# Patient Record
Sex: Male | Born: 1950 | Race: Black or African American | Hispanic: No | Marital: Married | Smoking: Former smoker
Health system: Southern US, Community
[De-identification: ages and names within clinical notes are randomized; demographics above are authoritative.]

## PROBLEM LIST (undated history)

## (undated) ENCOUNTER — Emergency Department (HOSPITAL_BASED_OUTPATIENT_CLINIC_OR_DEPARTMENT_OTHER): Admission: EM | Payer: Medicare HMO

## (undated) DIAGNOSIS — R0602 Shortness of breath: Secondary | ICD-10-CM

## (undated) DIAGNOSIS — I509 Heart failure, unspecified: Secondary | ICD-10-CM

## (undated) DIAGNOSIS — R609 Edema, unspecified: Secondary | ICD-10-CM

## (undated) DIAGNOSIS — J42 Unspecified chronic bronchitis: Secondary | ICD-10-CM

## (undated) DIAGNOSIS — I272 Pulmonary hypertension, unspecified: Secondary | ICD-10-CM

## (undated) DIAGNOSIS — I4891 Unspecified atrial fibrillation: Secondary | ICD-10-CM

## (undated) DIAGNOSIS — J439 Emphysema, unspecified: Secondary | ICD-10-CM

## (undated) DIAGNOSIS — J449 Chronic obstructive pulmonary disease, unspecified: Secondary | ICD-10-CM

## (undated) DIAGNOSIS — I351 Nonrheumatic aortic (valve) insufficiency: Secondary | ICD-10-CM

## (undated) DIAGNOSIS — N183 Chronic kidney disease, stage 3 unspecified: Secondary | ICD-10-CM

## (undated) DIAGNOSIS — R0601 Orthopnea: Secondary | ICD-10-CM

## (undated) DIAGNOSIS — I420 Dilated cardiomyopathy: Secondary | ICD-10-CM

## (undated) DIAGNOSIS — I739 Peripheral vascular disease, unspecified: Secondary | ICD-10-CM

## (undated) DIAGNOSIS — I639 Cerebral infarction, unspecified: Secondary | ICD-10-CM

## (undated) DIAGNOSIS — I10 Essential (primary) hypertension: Secondary | ICD-10-CM

## (undated) DIAGNOSIS — E119 Type 2 diabetes mellitus without complications: Secondary | ICD-10-CM

## (undated) DIAGNOSIS — Z0389 Encounter for observation for other suspected diseases and conditions ruled out: Secondary | ICD-10-CM

## (undated) DIAGNOSIS — I7090 Unspecified atherosclerosis: Secondary | ICD-10-CM

## (undated) HISTORY — DX: Orthopnea: R06.01

## (undated) HISTORY — DX: Chronic obstructive pulmonary disease, unspecified: J44.9

## (undated) HISTORY — PX: OTHER SURGICAL HISTORY: SHX169

## (undated) HISTORY — DX: Encounter for observation for other suspected diseases and conditions ruled out: Z03.89

## (undated) HISTORY — DX: Dilated cardiomyopathy: I42.0

## (undated) HISTORY — DX: Pulmonary hypertension, unspecified: I27.20

## (undated) HISTORY — PX: COLONOSCOPY: SHX174

## (undated) HISTORY — DX: Peripheral vascular disease, unspecified: I73.9

## (undated) HISTORY — DX: Heart failure, unspecified: I50.9

## (undated) HISTORY — DX: Essential (primary) hypertension: I10

## (undated) HISTORY — DX: Edema, unspecified: R60.9

## (undated) HISTORY — DX: Nonrheumatic aortic (valve) insufficiency: I35.1

## (undated) HISTORY — DX: Emphysema, unspecified: J43.9

## (undated) HISTORY — DX: Shortness of breath: R06.02

## (undated) HISTORY — DX: Unspecified chronic bronchitis: J42

## (undated) HISTORY — DX: Unspecified atherosclerosis: I70.90

## (undated) HISTORY — DX: Chronic kidney disease, stage 3 unspecified: N18.30

---

## 2007-10-27 DIAGNOSIS — IMO0001 Reserved for inherently not codable concepts without codable children: Secondary | ICD-10-CM

## 2007-10-27 HISTORY — PX: CARDIAC CATHETERIZATION: SHX172

## 2007-10-27 HISTORY — DX: Reserved for inherently not codable concepts without codable children: IMO0001

## 2008-10-16 ENCOUNTER — Inpatient Hospital Stay (HOSPITAL_COMMUNITY): Admission: EM | Admit: 2008-10-16 | Discharge: 2008-10-19 | Payer: Self-pay | Admitting: Emergency Medicine

## 2008-10-17 ENCOUNTER — Encounter: Payer: Self-pay | Admitting: Cardiology

## 2009-01-14 ENCOUNTER — Ambulatory Visit: Payer: Self-pay | Admitting: *Deleted

## 2009-01-22 ENCOUNTER — Ambulatory Visit: Payer: Self-pay | Admitting: Surgery

## 2009-02-04 ENCOUNTER — Ambulatory Visit (HOSPITAL_COMMUNITY): Admission: RE | Admit: 2009-02-04 | Discharge: 2009-02-04 | Payer: Self-pay | Admitting: Vascular Surgery

## 2009-02-04 ENCOUNTER — Ambulatory Visit: Payer: Self-pay | Admitting: Vascular Surgery

## 2009-02-12 ENCOUNTER — Ambulatory Visit: Payer: Self-pay | Admitting: Vascular Surgery

## 2009-02-20 ENCOUNTER — Inpatient Hospital Stay (HOSPITAL_COMMUNITY): Admission: RE | Admit: 2009-02-20 | Discharge: 2009-02-25 | Payer: Self-pay | Admitting: Vascular Surgery

## 2009-02-21 ENCOUNTER — Encounter: Payer: Self-pay | Admitting: Vascular Surgery

## 2009-02-21 ENCOUNTER — Ambulatory Visit: Payer: Self-pay | Admitting: Vascular Surgery

## 2009-03-05 ENCOUNTER — Ambulatory Visit: Payer: Self-pay | Admitting: Vascular Surgery

## 2009-05-07 ENCOUNTER — Ambulatory Visit: Payer: Self-pay | Admitting: Vascular Surgery

## 2010-06-20 ENCOUNTER — Ambulatory Visit: Payer: Self-pay | Admitting: Vascular Surgery

## 2010-09-03 ENCOUNTER — Ambulatory Visit: Payer: Self-pay | Admitting: Cardiology

## 2010-10-15 ENCOUNTER — Ambulatory Visit: Payer: Self-pay | Admitting: Cardiology

## 2010-12-24 ENCOUNTER — Encounter (INDEPENDENT_AMBULATORY_CARE_PROVIDER_SITE_OTHER): Payer: Self-pay

## 2010-12-24 DIAGNOSIS — Z48812 Encounter for surgical aftercare following surgery on the circulatory system: Secondary | ICD-10-CM

## 2010-12-24 DIAGNOSIS — I739 Peripheral vascular disease, unspecified: Secondary | ICD-10-CM

## 2010-12-30 NOTE — Procedures (Unsigned)
BYPASS GRAFT EVALUATION  INDICATION:  Follow up right bypass graft.  HISTORY: Diabetes:  No. Cardiac:  Yes, congestive heart failure. Hypertension:  Yes. Smoking:  Previous. Previous Surgery:  Right femoral-to-popliteal bypass graft, 02/20/2009 with nonreversed translocated saphenous vein.  SINGLE LEVEL ARTERIAL EXAM                              RIGHT              LEFT Brachial:                    147                137 Anterior tibial:             121                Inaudible Posterior tibial:            124                86 Peroneal: Ankle/brachial index:        0.84               0.59  PREVIOUS ABI:  Date: 06/20/2010  RIGHT:  0.91  LEFT:  0.64  LOWER EXTREMITY BYPASS GRAFT DUPLEX EXAM:  DUPLEX:  Patent right femoral-to-popliteal bypass graft with elevated velocities at 3.59 m/s within the inflow inguinal canal.  Stable compared to previous study of 3.48 m/s.  Waveforms are mildly broadened within the upper thigh.  IMPRESSION:  Stable ankle brachial indices.  Patent right femoral-to- popliteal bypass graft, as described above.    ___________________________________________ [name]. Scot Dock, M.D.  OD/MEDQ  D:  12/25/2010  T:  12/25/2010  Job:  [id number]

## 2011-02-03 LAB — CBC
HCT: 25.9 % — ABNORMAL LOW (ref 39.0–52.0)
MCHC: 34.3 g/dL (ref 30.0–36.0)
MCV: 101.4 fL — ABNORMAL HIGH (ref 78.0–100.0)
Platelets: 232 10*3/uL (ref 150–400)
RDW: 14.4 % (ref 11.5–15.5)

## 2011-02-03 LAB — BASIC METABOLIC PANEL
BUN: 15 mg/dL (ref 6–23)
Chloride: 101 mEq/L (ref 96–112)
GFR calc non Af Amer: >60 mL/min (ref >60)
Glucose, Bld: 113 mg/dL — ABNORMAL HIGH (ref 70–99)
Potassium: 4.3 mEq/L (ref 3.5–5.1)

## 2011-02-04 LAB — BASIC METABOLIC PANEL
BUN: 10 mg/dL (ref 6–23)
BUN: 11 mg/dL (ref 6–23)
Calcium: 8.4 mg/dL (ref 8.4–10.5)
Chloride: 106 mEq/L (ref 96–112)
Chloride: 109 mEq/L (ref 96–112)
Creatinine, Ser: 0.91 mg/dL (ref 0.4–1.5)
Creatinine, Ser: 1.24 mg/dL (ref 0.4–1.5)
GFR calc Af Amer: >60 mL/min (ref >60)
GFR calc non Af Amer: >60 mL/min (ref >60)
Glucose, Bld: 140 mg/dL — ABNORMAL HIGH (ref 70–99)

## 2011-02-04 LAB — COMPREHENSIVE METABOLIC PANEL
ALT: 33 U/L (ref 0–53)
Albumin: 3.8 g/dL (ref 3.5–5.2)
Alkaline Phosphatase: 61 U/L (ref 39–117)
GFR calc Af Amer: >60 mL/min (ref >60)
Potassium: 4.2 mEq/L (ref 3.5–5.1)
Sodium: 142 mEq/L (ref 135–145)
Total Protein: 7 g/dL (ref 6.0–8.3)

## 2011-02-04 LAB — APTT: aPTT: 46 seconds — ABNORMAL HIGH (ref 24–37)

## 2011-02-04 LAB — URINALYSIS, ROUTINE W REFLEX MICROSCOPIC
Glucose, UA: NEGATIVE mg/dL
Hgb urine dipstick: NEGATIVE
Specific Gravity, Urine: 1.029 (ref 1.005–1.030)
Urobilinogen, UA: 1 mg/dL (ref 0.0–1.0)

## 2011-02-04 LAB — POCT I-STAT, CHEM 8
BUN: 18 mg/dL (ref 6–23)
Calcium, Ion: 1.19 mmol/L (ref 1.12–1.32)
Chloride: 106 mEq/L (ref 96–112)
HCT: 45 % (ref 39.0–52.0)
Potassium: 4.4 mEq/L (ref 3.5–5.1)
Sodium: 139 mEq/L (ref 135–145)

## 2011-02-04 LAB — CBC
HCT: 28.3 % — ABNORMAL LOW (ref 39.0–52.0)
MCHC: 34.4 g/dL (ref 30.0–36.0)
MCV: 101 fL — ABNORMAL HIGH (ref 78.0–100.0)
Platelets: 198 10*3/uL (ref 150–400)
Platelets: 207 10*3/uL (ref 150–400)
Platelets: 312 10*3/uL (ref 150–400)
RBC: 2.73 MIL/uL — ABNORMAL LOW (ref 4.22–5.81)
RDW: 14.6 % (ref 11.5–15.5)
RDW: 14.6 % (ref 11.5–15.5)
WBC: 8 10*3/uL (ref 4.0–10.5)
WBC: 9.2 10*3/uL (ref 4.0–10.5)

## 2011-02-04 LAB — TYPE AND SCREEN
ABO/RH(D): O POS
Antibody Screen: NEGATIVE

## 2011-02-04 LAB — HEPARIN LEVEL (UNFRACTIONATED): Heparin Unfractionated: <0.1 IU/mL — ABNORMAL LOW (ref 0.30–0.70)

## 2011-02-04 LAB — ABO/RH: ABO/RH(D): O POS

## 2011-02-21 HISTORY — PX: FEMORAL-POPLITEAL BYPASS GRAFT: SHX937

## 2011-03-10 ENCOUNTER — Other Ambulatory Visit: Payer: Self-pay | Admitting: Cardiology

## 2011-03-10 NOTE — Discharge Summary (Signed)
NAME:  Kyle [name], PHOU NO.:  [id number]   MEDICAL RECORD NO.:  [id number]          PATIENT TYPE:  INP   LOCATION:  2023                         FACILITY:  [facility]   PHYSICIAN:  [name]. Scot Dock, M.D.DATE OF BIRTH:  04/15/1951   DATE OF ADMISSION:  02/20/2009  DATE OF DISCHARGE:  02/24/2009                               DISCHARGE SUMMARY   ADMISSION DIAGNOSIS:  Progressive ischemia of the right lower extremity  with multilevel arterio-occlusive disease.   FINAL DISCHARGE DIAGNOSES:  1. Progressive ischemia of the right lower extremity with multilevel      arterio-occlusive disease status post right femoral-popliteal      bypass grafting.  2. Postoperative hypoxia/oxygen dependent with negative chest x-ray      other than emphysema, improved.  3. History of tobacco abuse but quit in December 2009.  4. Hypertension.  5. Coronary artery disease with myocardial infarction in 2009.  6. Acute congestive hospitalization secondary to systolic dysfunction,      requiring hospitalization on October 19, 2008 felt secondary to      longstanding uncontrolled hypertension with hypertensive heart      disease and left ventricular hypertrophy.  7. Moderate aortic insufficiency with bicuspid aortic valve.  8. Normal coronary anatomy by cardiac catheterization on October 18, 2008.  9. Mild postoperative acute blood loss anemia.   PROCEDURES:  February 20, 2009, vein patch angioplasty of the common  femoral artery with profundoplasty, right femoral to below-knee  popliteal artery bypass with a nonreverse translocated saphenous vein  graft, vein patch angioplasty of vein graft stenosis with intraoperative  arteriogram by Dr. [name].   BRIEF HISTORY:  [name] is a 60 year old black male with a long  history of right calf claudication.  This progressed to the point of  rest pain.  He underwent arteriogram which showed multilevel arterio-  occlusive  disease.  He had diffuse iliofemoral disease which did not  appear to be significant.  There was a tight stenosis of his proximal  deep femoral artery and the superficial femoral artery was occluded at  its origin.  There was reconstitution of the popliteal artery at the  level of the knee with two-vessel runoff near the peroneal and posterior  tibial arteries.  [name] recommended that he undergo right femoral-  popliteal artery bypass grafting as well as profundoplasty.   HOSPITAL COURSE:  [name] was electively admitted to [facility] on February 20, 2009.  He underwent the previously mentioned  procedure.  Postoperatively, he was transferred to Step-Down Unit 3300.  He was on heparin drip for the initial postoperative period but was  subsequently changed to subcutaneous Lovenox which was more for DVT  prophylaxis.  Jackson-Pratt drain was placed intraoperatively but was  discontinued by postoperative day #2, as there was no significant  output.  After postoperative day #1, he was transferred to Telemetry  Unit 2000 where he remained until discharge.  His postoperative ABIs  were 0.57 on the right and 0.67 on the left.  He was seen by Physical  Therapy and Cardiac Rehab and did make progress with mobility, but was  felt that he would benefit from crutches versus a rolling walker.  For  crutches, we have asked case manager to arrange this prior to discharge.  He had a relatively uneventful postoperative course but was requiring  oxygen up to 4 L per nasal cannula.  He had no shortness of breath.  Lung sounds were clear.  However, he did have a fairly significant  amount of nasal congestion and possible viral rhinitis or allergic  rhinitis.  He was on humidified oxygen.  We ultimately did check a chest  x-ray which showed hyperexpansion consistent with emphysema but  otherwise no active disease.  We did prescribe Flonase and per postop  day #4, he was at 95% on 1/2 L of  oxygen per nasal cannula and we do not  anticipate that we will have any trouble weaning him to room air at this  point.  He feels that he has less nasal congestion.  He continues to  have no shortness of breath.  He is maintaining sinus rhythm.  He is  tolerating regular foods and voiding without difficulties.  Extremities  have a positive Doppler signal with dorsalis and posterior tibial pulses  on the right.  Incisions are clean, dry, and intact without erythema.  His labs were stable showing a white count of 8.1, some mild  postoperative acute blood loss anemia with hemoglobin of 8.9, hematocrit  25.9, and platelet count of 237.  Sodium 135, potassium 4.3, BUN 15,  creatinine 1.17, and blood glucose of 113.  His blood pressure has been  stable, most currently 120/71 and heart rate in sinus rhythm.  He has  been afebrile.  Again, we anticipate that he will be weaned completely  from supplemental oxygen on Feb 24, 2009, so we plan that he will be  ready for discharge home at that time.  He is felt to be in stable and  improving condition.   DISCHARGE MEDICATIONS:  1. Amlodipine 5 mg p.o. daily.  2. Lasix 40 mg p.o. daily.  3. Ramipril 5 mg p.o. daily.  4. Klor-Con 20 mEq.  5. Carvedilol 12.5 mg p.o. b.i.d.  6. Baby aspirin 81 mg p.o. daily.  7. Percocet 5/325 mg one each tablet p.o. q.4 h. p.r.n. pain.  8. Flonase 0.05% two sprays in each nostril daily as needed for nasal      congestion and we will instruct not to use for greater than 2      weeks.   DISCHARGE INSTRUCTIONS:  Continue heart-healthy diet.  Increase activity  slowly.  Avoid driving or heavy lifting for the next 2 weeks and see Dr.  Scot Dock in approximately 3 weeks with postoperative ABIs.  He has a  staple removal appointment in 7-10 days and our office will contact him  regarding specific appointment date and time.  He should call sooner if  he has fever greater than 101, redness or drainage from his incision  site  or for increased pain.  He should cover his dressings daily as  needed with gauze dressing for drainage.      [name], P.A.      [name]. Scot Dock, M.D.  Electronically Signed    AWZ/MEDQ  D:  02/24/2009  T:  02/25/2009  Job:  [id number]   cc:   Peter M. Martinique, M.D.  Starla Link, M.D.

## 2011-03-10 NOTE — Procedures (Signed)
BYPASS GRAFT EVALUATION   INDICATION:  Followup right lower extremity bypass graft.   HISTORY:  Diabetes:  No.  Cardiac:  No.  Hypertension:  Yes.  Smoking:  Previous.  Previous Surgery:  Right fem-pop bypass graft on 02/20/2009.   SINGLE LEVEL ARTERIAL EXAM                               RIGHT              LEFT  Brachial:                    127                125  Anterior tibial:             106                50  Posterior tibial:            116                81  Peroneal:  Ankle/brachial index:        0.91               0.64   PREVIOUS ABI:  Date:  05/07/2009  RIGHT:  0.94  LEFT:  0.65   LOWER EXTREMITY BYPASS GRAFT DUPLEX EXAM:   DUPLEX:  Patent right fem-pop bypass graft with elevated velocities of  348 cm/second at right common femoral artery level.  There are biphasic  waveforms noted proximal, within and distal to graft.   IMPRESSION:  1. Stable bilateral ankle brachial indices.  2. Right fem-pop appears patent with elevated velocities as noted      above.   ___________________________________________  [name]. Scot Dock, M.D.   CB/MEDQ  D:  06/20/2010  T:  06/20/2010  Job:  [id number]

## 2011-03-10 NOTE — Discharge Summary (Signed)
NAME:  Kyle [name], Kyle [name] NO.:  [id number]   MEDICAL RECORD NO.:  [id number]          PATIENT TYPE:  INP   LOCATION:  2040                         FACILITY:  [facility]   PHYSICIAN:  Peter M. Martinique, M.D.  DATE OF BIRTH:  08/14/1951   DATE OF ADMISSION:  10/16/2008  DATE OF DISCHARGE:                               DISCHARGE SUMMARY   HISTORY OF PRESENT ILLNESS:  Mr. Kenaston is a 60 year old black male who  was referred for symptoms of worsening shortness of breath, orthopnea,  and cough productive of clear sputum.  He had no fever or chills.  He  has noticed increased lower extremity edema.  He had no prior cardiac  history or known history of hypertension.  On his initial evaluation, he  was found to be severely hypertensive.  The patient is a chronic smoker,  smoking 1 pack per day.   For details of his past medical history, social history, family history,  and physical exam, please see admission history and physical.   LABORATORY DATA:  ECG showed normal sinus rhythm with biatrial  enlargement, LVH, and repolarization abnormality.  Chest x-ray from  [facility] showed cardiomegaly with cephalization of flow and very early  CHF.   ADDITIONAL LABORATORY DATA:  White count 7500, hemoglobin 15.7,  hematocrit 47.5, and platelets 280,000.  BNP level was 1877.  Arterial  blood gas showed a pH of 7.41, pCO2 of 35, pO2 of 82, and bicarbonate of  22 on 2 liters nasal cannula.  Sodium was 144, potassium 4.2, chloride  111, CO2 of 24, BUN 15, creatinine 0.95, glucose of 118, and albumin was  3.0.  Other liver function studies were normal.  CPK-MB was 129 with 5.5  MB and then 101 with 4.7 MB.  Troponin was 0.08 and then 0.10.  Total  cholesterol was 128 with triglyceride of 86, HDL of 32, and LDL of 79.  Urinalysis was normal.  Coags were normal.  Hemoglobin A1c was 6.7%   HOSPITAL COURSE:  The patient was admitted to Telemetry Monitoring.  He  was placed on Lovenox that is  subcu prophylactic dose.  He was started  on antihypertensive therapy with amlodipine and ACE inhibitor.  He was  diuresed with IV Lasix.  During his hospital course, he was given  smoking cessation counseling and did appear to be very motivated.  Subsequent echocardiogram was obtained.  This demonstrated moderate  concentric left ventricular hypertrophy.  He had severe global  hypokinesia with overall ejection fraction of 20-25%.  The aortic valve  was bicuspid.  There was very mild aortic stenosis by Doppler and  moderate aortic insufficiency.  There was mild mitral insufficiency and  moderate tricuspid insufficiency.  Right ventricular systolic pressure  was moderately increased.  There was mild biatrial enlargement.  Followup chest x-ray the day after his admission showed increased  pulmonary edema.  The patient did respond very well to diuresis with  reduction in his edema and weight and good urine output.  His BNP level  declined to 971.  His renal function remained stable with a BUN  of 22  and creatinine 1.1.  Potassium was 3.9.  On October 18, 2008, the  patient underwent cardiac catheterization.  This included right and left  heart catheterization.  He was found to have normal coronary anatomy.  Left ventricular function again was noted to be severely reduced with  ejection fraction of 20-25% with global LV dysfunction.  The aortic root  was normal with calcification of the aortic valve and moderate aortic  insufficiency.  Right atrial pressure was 11/6 with a mean of 6 mmHg.  Pulmonary pressure was 45/23 consistent with moderate pulmonary  hypertension.  Pulmonary capillary wedge pressure was 12/11 with a mean  of 10 mmHg.  There was no significant aortic or mitral valve gradient.  The patient was transitioned over to oral diuretics.  He was noted to  have 2 episodes of nonsustained ventricular tachycardia 126 beats on  October 17, 2008, and 106 beats on October 18, 2008.  He  was  asymptomatic with these episodes.  The patient responded well to therapy  with excellent blood pressure control and given his good excellent  clinical response, he was felt to be stable for discharge on October 19, 2008.   DISCHARGE DIAGNOSES:  1. Acute congestive heart failure secondary to systolic dysfunction.      Etiology is felt to be longstanding uncontrolled hypertension with      hypertensive heart disease and left ventricular hypertrophy.  2. Moderate aortic insufficiency with bicuspid aortic valve.  3. Normal coronary anatomy.  4. Tobacco abuse.  5. Severe hypertension.  6. Nonsustained ventricular tachycardia.   DISCHARGE MEDICATIONS:  1. Coated aspirin 81 mg per day.  2. Lasix 40 mg orally daily.  3. Potassium 20 mEq daily.  4. Carvedilol 12.5 mg twice a day.  5. Altace 2.5 mg daily.  6. Amlodipine 5 mg per day.   The patient is instructed on a low-sodium, heart-healthy diet.  He is  given smoking cessation counseling.  He is given routine counseling on  followup with congestive heart failure.  He will have a followup with  Dr. Martinique in 2 weeks post discharge.   DISCHARGE STATUS:  Improved.           ______________________________  Peter M. Martinique, M.D.     PMJ/MEDQ  D:  10/18/2008  T:  10/18/2008  Job:  [id number]   cc:   Caprice Red Dr. Tammi Klippel

## 2011-03-10 NOTE — Op Note (Signed)
NAME:  Kyle [name], Kyle [name]                ACCOUNT NO.:  [id number]   MEDICAL RECORD NO.:  [id number]          PATIENT TYPE:  AMB   LOCATION:  SDS                          FACILITY:  [facility]   PHYSICIAN:  [name]. Scot Dock, M.D.DATE OF BIRTH:  1951-09-29   DATE OF PROCEDURE:  02/04/2009  DATE OF DISCHARGE:                               OPERATIVE REPORT   PREOPERATIVE DIAGNOSIS:  Right lower extremity claudication and rest  pain in the right foot with infrainguinal arterial occlusive disease.   POSTOPERATIVE DIAGNOSIS:  Right lower extremity claudication and rest  pain in the right foot with infrainguinal arterial occlusive disease.   PROCEDURE:  1. Ultrasound-guided access to the left common femoral artery.  2. Aortogram with bilateral iliac arteriogram and bilateral lower      extremity runoff.  3. Selective catheterization of the right external iliac artery.   SURGEON:  [name]. Scot Dock, M.D.   ANESTHESIA:  Local with sedation.   TECHNIQUE:  The patient was taken to the PV lab and sedated with 1 mg of  Versed and 50 mcg of fentanyl.  Both groins were prepped and draped in  usual sterile fashion.  After the skin was infiltrated with 1% lidocaine  and under ultrasound guidance, the left common femoral artery was  cannulated and a guidewire introduced into the infrarenal aorta under  fluoroscopic control.  A 5-French sheath was introduced over the wire.  Pigtail catheter was positioned at the L1 vertebral body and flush  aortogram obtained.  The catheter was then positioned above the aortic  bifurcation and oblique iliac projections were obtained.  Next the  pigtail catheter was exchanged for an IMA catheter which was positioned  into the right common iliac artery.  The wire was then advanced down  into the external iliac artery and the IMA catheter exchanged for an end-  hole catheter which was positioned in the distal right external iliac  artery.  Right external iliac  arteriogram was obtained with right lower  extremity runoff.  The end-hole catheter was then removed and then left  lower extremity films obtained through the left femoral sheath.   FINDINGS:  There are single renal arteries bilaterally with no  significant renal artery stenosis identified.  The infrarenal aorta is  widely patent.   On the right side there is some mild diffuse disease of the common and  external iliac artery but no focal stenosis.  Hypogastric artery on the  right is occluded.  The common femoral and deep femoral artery are  patent although there is a tight 90-95% proximal right deep femoral  artery stenosis which is fairly focal.  The superficial femoral artery  is occluded at its origin and there is reconstitution of the popliteal  artery at the level of the knee with two-vessel runoff via the posterior  tibial and peroneal arteries.  The anterior tibial artery on the right  is occluded.   On the left side the common iliac, external iliac and hypogastric  arteries are patent.  The common femoral and deep femoral artery are  patent.  On the left  side the superficial femoral artery is occluded at  its origin with reconstitution of the above-knee popliteal artery.  The  popliteal artery is patent and there is two-vessel runoff on the right  via the posterior tibial and peroneal arteries.  The anterior tibial  artery on the left is occluded.   CONCLUSIONS:  Bilateral superficial femoral artery occlusions with two-  vessel runoff bilaterally as described above.      [name]. Scot Dock, M.D.  Electronically Signed     CSD/MEDQ  D:  02/04/2009  T:  02/04/2009  Job:  [id number]   cc:   Peter M. Martinique, M.D.  Fax: [phone]   Dr. Starla Link

## 2011-03-10 NOTE — Cardiovascular Report (Signed)
NAME:  Kyle [name], Kyle [name] NO.:  [id number]   MEDICAL RECORD NO.:  [id number]          PATIENT TYPE:  INP   LOCATION:  2040                         FACILITY:  [facility]   PHYSICIAN:  Peter M. Martinique, M.D.  DATE OF BIRTH:  12-26-1950   DATE OF PROCEDURE:  10/18/2008  DATE OF DISCHARGE:                            CARDIAC CATHETERIZATION   INDICATIONS FOR PROCEDURE:  A 60 year old black male who presented with  new-onset congestive heart failure.  Echocardiogram shows severe left  ventricular dysfunction.  The patient has a bicuspid aortic valve with  moderate aortic insufficiency.  The patient also had a mild cardiac  enzyme leak on admission.   PROCEDURES:  1. Right and left heart catheterization.  2. Coronary angiography.  3. Left ventricular angiography.   EQUIPMENT USED:  6-French 4-cm right and left Judkins catheter, 6-French  pigtail catheter, 6-French arterial sheath, 7-French venous sheath, and  7-French balloon-tipped Swan-Ganz catheter.   MEDICATIONS:  Local anesthesia, 1% Xylocaine.   CONTRAST:  125 mL of Omnipaque.   HEMODYNAMIC DATA:  Right atrial pressure was 11/6 with a mean 6 mmHg.  Right ventricular pressure is 43 with an EDP of 12 mmHg.  Pulmonary  artery pressure is 45/23 with a mean of 31 mmHg.  Pulmonary capillary  wedge pressure is 12/11 with a mean of 10 mmHg.  Aortic pressure is  119/65 with a mean of 87 mmHg.  Left ventricular pressure is 118 with an  EDP of 15 mmHg.  There is no significant aortic valve gradient by  pullback.  There is no significant mitral valve gradient.  Cardiac  output by Fick is 3.37 L/min with an index of 1.83 by thermodilution  cardiac outputs 2.83 with an index of 1.54.   ANGIOGRAPHIC DATA:  Aortic root angiography was performed in the RAO  view.  This demonstrates a calcified aortic valve.  The aortic root  appears normal.  There is moderate aortic insufficiency graded 3+.   Left ventricular angiography was also  performed in the RAO view.  This  demonstrates severe global hypokinesia with overall ejection fraction  estimated at 20-25%.  There is trivial mitral insufficiency noted.  No  intracavitary filling defects are noted.   CORONARY ANGIOGRAPHY:  The left main coronary is normal.  It is  relatively short.   Left anterior descending artery is a large vessel, which wraps around  the apex.  It gives rise to 2 small diagonal branches.  It is normal  throughout its course.   The left circumflex coronary is a large vessel.  The first and second  marginal vessels are large in caliber.  The third and fourth obtuse  marginal vessels were relatively small.  The entire circumflex  distribution is normal.   The right coronary artery is a dominant vessel and is normal throughout  its course.   FINAL INTERPRETATION:  1. Severe global left ventricular dysfunction with an ejection      fraction of 20-25%.  2. Moderate aortic insufficiency.  3. Moderate pulmonary hypertension.  4. Normal coronary anatomy.   PLAN:  We would recommend  continued aggressive medical therapy.           ______________________________  Peter M. Martinique, M.D.     PMJ/MEDQ  D:  10/18/2008  T:  10/18/2008  Job:  [id number]   cc:   Vernell Leep, MD

## 2011-03-10 NOTE — Op Note (Signed)
NAME:  Kyle [name], Kyle [name] NO.:  [id number]   MEDICAL RECORD NO.:  [id number]          PATIENT TYPE:  INP   LOCATION:  3313                         FACILITY:  [facility]   PHYSICIAN:  [name]. Scot Dock, M.D.DATE OF BIRTH:  1951/06/01   DATE OF PROCEDURE:  02/20/2009  DATE OF DISCHARGE:                               OPERATIVE REPORT   PREOPERATIVE DIAGNOSIS:  Progressive ischemia of the right lower  extremity with multilevel arterial occlusive disease.   POSTOPERATIVE DIAGNOSIS:  Progressive ischemia of the right lower  extremity with multilevel arterial occlusive disease.   PROCEDURE:  1. Vein patch angioplasty of the common femoral artery with      profundoplasty.  2. Right femoral to below-knee popliteal artery bypass graft with a      nonreversed translocated saphenous vein graft.  3. Vein patch angioplasty of vein graft stenosis.  4. Intraoperative arteriogram.   SURGEON:  [name]. Scot Dock, MD   ASSISTANT:  [name], PA   ANESTHESIA:  General.   INDICATIONS:  This is a 60 year old gentleman with a long history of  right calf claudication.  This had progressed to the point of rest pain  in his right foot.  His symptoms became progressive and he underwent  arteriogram which showed multilevel arterial occlusive disease.  He had  some diffuse iliofemoral disease which did not appear to be significant.  He had a tight stenosis of his proximal deep femoral artery and the  superficial femoral artery was occluded at its origin.  There is  reconstitution of the popliteal artery at the level of the knee with 2-  vessel runoff via the peroneal and posterior tibial arteries.  He is  brought in for elective revascularization.   TECHNIQUE:  The patient was taken to the operating room and the entire  right lower extremity and right groin were prepped and draped in the  usual sterile fashion.  A longitudinal incision was made in the right  groin and  through this incision the common femoral, superficial femoral,  and deep femoral arteries were dissected free.  There was diffuse  disease throughout the common femoral artery posteriorly and also the  proximal deep femoral artery.  I dissected on the deep femoral artery  beyond the area of stenosis.  I had to clamp up fairly high under the  inguinal ligament where the vessel was softer.  There was stenosis in  the mid segment of the common femoral artery which was seen on  arteriogram which was clearly identified.  Through this same incision,  the saphenofemoral junction was dissected free, and using 3 additional  incisions along the medial aspect of the right leg, the saphenous vein  was harvested to the mid calf.  Branches were divided between clips and  3-0 silk ties.  Through the distal incision which was below the knee,  the below-knee popliteal artery was exposed and this did appear to be  patent at this level.  A tunnel was then created between the below-knee  incision to the groin incision and the patient was then heparinized.  The  saphenofemoral junction was clamped and then the saphenous vein was  excised from the femoral vein.  The femoral vein was oversewn with a 5-0  Prolene suture and there was good hemostasis.  The proximal valve in the  vein was sharply excised.  Next, the deep femoral artery was clamped  distally.  The superficial femoral artery was occluded, the external  iliac artery was clamped, and 2 additional branches were controlled with  vessel loops.  A longitudinal arteriotomy was made in the anterolateral  wall of the common femoral artery and this was distended through the  proximal stenosis in the deep femoral artery down into the deep femoral  artery beyond the stenosis.  The arteriotomy was then extended  proximally above the area of stenosis in the common femoral artery where  the vessel was patent.  There was marked diffuse disease of the common  femoral  artery and I felt the risk of endarterectomizing this area would  be difficult at an adequate endpoint into the deep femoral artery.  I  elected, therefore, to simply patch this area with a vein patch.  I did  not think he would have adequate length to use the segment which had  been harvested previously, so I harvested vein from the distal leg on  the right through a longitudinal incision and again branches were  divided between clips and 3-0 silk ties.  The segment of vein was  excised and then opened longitudinally and was then sewn as a vein patch  using two 5-0 Prolene sutures.  I was able to easily get a 3.5-mm  dilator down into the deep femoral artery after the profundoplasty.  Prior to completing this anastomosis, the artery was backbled and  flushed.  There was good inflow.  The vessels were then controlled again  and a longitudinal venotomy was made over the vein patch.  The saphenous  vein was sewn in a nonreversed fashion end to side to the vein patch  using continuous 6-0 Prolene suture.  At the completion, the clamps were  released and there was an excellent pulse in the proximal vein.  I then  used a retrograde Mills valvulotome to lyse all the valves in the vein.  The vein was reasonable size, although not especially large.  There was  1 area over approximately 3 cm which narrowed down to 3.5 mm and this  was clearly the smallest area in the vein and I was concerned about this  area.  For this reason, I elected to do a vein patch angioplasty of this  area as I thought it was a little small in this one area.  The remaining  segment of vein had been shaved and it was sewn as a vein patch using  continuous 6-0 Prolene suture.  At the completion, the clamps were  released and there was excellent flow through the graft.  The vein was  marked to prevent twisting and then brought down to the previously  created tunnel for anastomosis to the below-knee popliteal artery.  A   tourniquet was placed on the thigh.  The leg was exsanguinated with an  Esmarch bandage and the tourniquet was inflated to 250 mmHg.  Under  tourniquet control, a longitudinal arteriotomy was made in the below-  knee popliteal artery and vein graft was cut to the appropriate length,  spatulated, and sewn end to side to the below-knee popliteal artery  using 2 continuous 6-0 Prolene sutures parachuting both the heel and  toe  of the anastomosis.  At the completion, there was good posterior tibial  signal at the Doppler which was graft dependent.  A completion  arteriogram was obtained which showed no technical problems.  Hemostasis  was obtained in the wounds.  A #15 Blake drain was placed in groin  incision and also in the below-the-knee incision.  The distal leg  incision was closed with deep layer of 3-0 Vicryl and the skin was  closed with staples.  The groin incision was closed with a deep layer of  2-0 Vicryl, subcutaneous layer of 2-0 Vicryl, and the skin was closed  with 4-0 subcuticular stitch.  The remaining incisions were closed with  a deep layer of 3-0 Vicryl and the skin was closed with staples.  A  sterile dressing was applied.  The patient tolerated the procedure well  and was transferred to recovery room in satisfactory condition.  All  needle and sponge counts were correct.      [name]. Scot Dock, M.D.  Electronically Signed     CSD/MEDQ  D:  02/20/2009  T:  02/21/2009  Job:  [id number]   cc:   Peter M. Martinique, M.D.  Dr. Starla Link

## 2011-03-10 NOTE — Telephone Encounter (Signed)
[name] called She said that her hausband gets his meds thru [facility] and they didn't get all paperwork completed. So he couldn't get his meds. He is totally out of all his meds. She wanted to know if Dr Martinique could write new RX's so they could get at [facility] Please call

## 2011-03-10 NOTE — Procedures (Signed)
BYPASS GRAFT EVALUATION   INDICATION:  Right lower extremity bypass graft.   HISTORY:  Diabetes:  No.  Cardiac:  No.  Hypertension:  Yes.  Smoking:  Previous.  Previous Surgery:  Right fem-pop bypass graft on 02/20/2009.   SINGLE LEVEL ARTERIAL EXAM                               RIGHT              LEFT  Brachial:                    142                140  Anterior tibial:             124                Not detected  Posterior tibial:            133                92  Peroneal:                                       82  Ankle/brachial index:        0.94               0.65   PREVIOUS ABI:  Date:  01/14/2009  RIGHT:  0.38  LEFT:  0.62   LOWER EXTREMITY BYPASS GRAFT DUPLEX EXAM:   DUPLEX:  Biphasic Doppler waveforms noted throughout the right lower  extremity bypass graft and its native vessels.  There is mildly elevated  velocity of 256 cm/second noted in the right common femoral artery.  There is an elevated velocity of 182 cm/second noted at the distal  anastomosis level, however, this is most likely due to a change in  vessel diameter.   IMPRESSION:  1. Patent right fem-pop bypass graft with increased velocities noted      as described above.  2. Stable left ABI with an improvement of the right ABI noted.   ___________________________________________  [name]. Scot Dock, M.D.   CH/MEDQ  D:  05/08/2009  T:  05/08/2009  Job:  [id number]

## 2011-03-10 NOTE — Procedures (Signed)
CAROTID DUPLEX EXAM   INDICATION:  Left carotid bruit.   HISTORY:  Diabetes:  No.  Cardiac:  No.  Hypertension:  Yes.  Smoking:  Quit.  Previous Surgery:  No.  CV History:  No.  Amaurosis Fugax No, Paresthesias No, Hemiparesis No                                       RIGHT             LEFT  Brachial systolic pressure:  Brachial Doppler waveforms:  Vertebral direction of flow:        Antegrade         Antegrade  DUPLEX VELOCITIES (cm/sec)  CCA peak systolic                   89                [id number]  ECA peak systolic                   100               96  ICA peak systolic                   66                96  ICA end diastolic                   19                20  PLAQUE MORPHOLOGY:                  Heterogenous      Heterogenous  PLAQUE AMOUNT:                      Mild              Mild  PLAQUE LOCATION:                    ICA/ECA           ICA/ECA   IMPRESSION:  1. 1-39% stenosis noted in bilateral ICAs.  2. Antegrade bilateral vertebral arteries.       ___________________________________________  [name]. Scot Dock, M.D.   MG/MEDQ  D:  01/22/2009  T:  01/23/2009  Job:  [id number]

## 2011-03-10 NOTE — H&P (Signed)
NAME:  Kyle [name], Kyle [name] NO.:  [id number]   MEDICAL RECORD NO.:  [id number]          PATIENT TYPE:  EMS   LOCATION:  MAJO                         FACILITY:  [facility]   PHYSICIAN:  Peter M. Martinique, M.D.  DATE OF BIRTH:  [birth year]   DATE OF ADMISSION:  10/16/2008  DATE OF DISCHARGE:                              HISTORY & PHYSICAL   HISTORY OF PRESENT ILLNESS:  Kyle [name] is a 60 year old black male  referred by Dr. Tammi Klippel at [facility] for acute respiratory distress.  This patient's primary physician is Dr. Starla Link at [facility].  In general, he has been in good health.  He does have a long history of  tobacco abuse.  He presents with a 1-week history of worsening shortness  of breath, orthopnea, and cough productive of mild clear sputum.  He has  had no fever, chills.  He denies any chest pain.  He has noted  increasing lower extremity edema.  He has no prior history of congestive  heart failure or other cardiac disease, and no prior history of  hypertension.   PAST MEDICAL HISTORY:  The patient reports no prior hospitalizations or  surgeries.  He denies any history of hypertension, hypercholesterolemia,  or diabetes.  He has had no other medical illnesses.  He is on no  medications.  He has no known allergies.   SOCIAL HISTORY:  He is married.  He has 1 child.  He works in [employer].  He smokes 1 pack per day.  He has been a smoker for 40 years.  He  does drink occasional beer.   FAMILY HISTORY:  His father's history is unknown.  His mother died of  diabetic coma at age 69.  He has 1 sister who is alive and well.   REVIEW OF SYSTEMS:  He has had a colonoscopy this past year that was  negative.  He denies any history of TIA or stroke.  He denies prior  history of hypertension.  He denies any hemoptysis.  He has had no  recent change in bowel or bladder habits.  Perhaps, his urine output has  decreased slightly.  He denies any claudication symptoms or  cold feet.  All other systems were reviewed and are negative.   PHYSICAL EXAMINATION:  GENERAL:  The patient is a thin black male in no  apparent distress.  VITAL SIGNS:  Blood pressure is 187/110, pulse is 99 and regular.  He is  afebrile, respirations 22, sats were 94% on 2 L nasal cannula.  HEENT:  Normocephalic, atraumatic.  His pupils equal, round, and  reactive to light and accommodation.  Extraocular movements were full.  Oropharynx is clear.  Tongue is midline.  NECK:  Supple with mild jugular venous distention.  He has no bruits,  adenopathy, or thyromegaly.  LUNGS:  Bibasilar crackles.  CARDIAC:  Prominent PMI displaced laterally.  He has a loud S4.  There  is no S3 or murmur.  ABDOMEN:  Soft, nontender without masses, hepatosplenomegaly, or bruits.  Bowel sounds were positive.  EXTREMITIES:  Femoral pulses 2+ and symmetric.  Pedal pulses were very  poor.  He has 1-2+ lower extremity edema.  His feet are warm and dry.  SKIN:  Dry.  NEUROLOGIC:  He is alert and oriented x4.  His cranial nerves II through  XII are intact.  He has no focal findings.   LABORATORY DATA:  ECG shows normal sinus rhythm with right atrial  enlargement, LVH, and repolarization changes.  Chest x-ray from  [place] shows cardiomegaly, there is cephalization of flow and mild  changes in COPD.  There were no pleural effusions or acute infiltrates.   IMPRESSION:  1. Acute respiratory distress most likely consistent with congestive      heart failure due to hypertensive heart disease.  2. Severe hypertension, uncontrolled.  3. Chronic obstructive pulmonary disease.  4. Tobacco abuse.   PLAN:  We will admit to telemetry.  We will check labs including CBC,  chemistries, BNP level, cardiac enzymes, lipid panel, A1c.  We will also  check a urinalysis.  We will treat with diuresis.  We will also start on  Norvasc and ACE inhibitor for blood pressure control.  We will hold on  beta-blocker at this time.   Given his heavy tobacco use and possible  COPD, the patient will be counseled on smoking cessation.  We will also  obtain an echocardiogram to evaluate his LV function.           ______________________________  Peter M. Martinique, M.D.     PMJ/MEDQ  D:  10/16/2008  T:  10/17/2008  Job:  [id number]   cc:   Rulon Eisenmenger, M.D.

## 2011-03-10 NOTE — Procedures (Signed)
VASCULAR LAB EXAM   INDICATION:  Evaluation of right greater saphenous vein prior to right  lower extremity revascularization.   HISTORY:  Diabetes:  No.  Cardiac:  No.  Hypertension:  Yes.   EXAM:  Mapping of right greater saphenous vein.   IMPRESSION:  Right greater saphenous vein was identified.  Diameter  measurements ranged from 0.32 cm to 0.49 cm from the saphenofemoral  junction to the proximal calf.  Measurements are on the affixed page.   ___________________________________________  [name]. Scot Dock, M.D.   MC/MEDQ  D:  02/12/2009  T:  02/12/2009  Job:  [id number]

## 2011-03-10 NOTE — Consult Note (Signed)
VASCULAR SURGERY CONSULTATION   Kyle [name], Kyle [name] [name]  DOB:  [birth year]                                       01/22/2009  CHART#:[id number]   I saw the patient in the office today in consultation concerning his  right foot pain.  This is a pleasant 60 year old gentleman who has had  some right calf claudication for approximately a month.  The calf  claudication occurs at less than 20 yards and is relieved with rest.  He  has had no thigh or hip claudication.  He has had no symptoms in the  left leg.  Approximately 3 weeks ago he began noticing the gradual onset  of increasing pain in the right foot and developed rest pain in the  right foot.  He has had no history of nonhealing ulcers.  His symptoms  have been relatively stable over the last 2 weeks.   PAST MEDICAL HISTORY:  Significant for hypertension and history of  congestive heart failure.  In addition, he has a history of COPD.  He  denies any history of diabetes, hypercholesterolemia.  He does state  that he had a myocardial infarction in December of 2009.   FAMILY HISTORY:  There is no history of premature cardiovascular  disease.   SOCIAL HISTORY:  He is married.  He has one child.  He had smoked one  pack per day of cigarettes for 40 years but states that he quit smoking  in December 2009.  He has three beers a day.   REVIEW OF SYSTEMS:  GENERAL:  He has had no weight loss, weight gains or  problems with his appetite.  CARDIAC:  He has had no chest pain, chest pressure, palpitations or  arrhythmias.  PULMONARY:  He has had no productive cough, bronchitis, asthma or  wheezing.  GI, GU, neuro, ortho, psychiatric, ENT and hematologic review of systems  are unremarkable and are documented on the medical history form in his  chart.   MEDICATIONS:  1. Aspirin 81 mg p.o. daily.  2. Lasix 40 mg p.o. daily.  3. Potassium 20 mEq p.o. daily.  4. Carvedilol 12.5 mg p.o. b.i.d.  5. Altace 2.5 mg daily.  6.  Amlodipine 5 mg p.o. daily.   PHYSICAL EXAMINATION:  This is a pleasant 60 year old gentleman who  appears his stated age.  Blood pressure is 132/77, heart rate is 57.  Neck is supple.  There is no cervical lymphadenopathy.  He does have a  soft left carotid bruit.  Lungs are clear bilaterally to auscultation.  On cardiac exam he has a regular rate and rhythm.  Abdomen is soft and  nontender.  I cannot palpate an aneurysm.  He has normal femoral pulses.  I cannot palpate popliteal or pedal pulses on either side.  He has a  monophasic peroneal and posterior tibial signal on the right with a  monophasic posterior tibial and anterior tibial signal on the left.  He  has no ischemic ulcers.  The right foot is slightly cooler than the  left.  He has no mottling.   He did have Doppler studies on 01/16/2009 which showed an ABI of 38% on  the right and 62% on the left.   Based on his exam I think he has evidence of multilevel arterial  occlusive disease on the right and likely has a superficial  femoral  artery occlusion in addition to tibial occlusive disease.  Given that he  is having significant rest pain on the right I have recommended we  proceed with arteriography to see what options he might have for  revascularization.  I have explained there is a small chance we will  find some infrainguinal disease on the right which is amenable to  angioplasty but more likely he would require bypass for  revascularization.  We have discussed the indications for arteriography  and the potential complications including but not limited to bleeding,  arterial injury, renal insufficiency.  All of his questions were  answered and he is agreeable to proceed.  We will make further  recommendations pending the results of his arteriogram.  I did explain  that if he has a superficial femoral artery stenosis amenable to  angioplasty this could potentially be addressed at the same time.  We  have reviewed the  potential complications of angioplasty also.  When he  comes in for his followup visit after his arteriogram we will obtain a  carotid duplex scan to work up his left carotid bruit.  If he is going  to require bypass for revascularization on the right he will also get a  vein map of the right greater saphenous vein.   Kyle [name] [name], M.D.  Electronically Signed  CSD/MEDQ  D:  01/22/2009  [name]:  01/23/2009  Job:  [id number]   cc:   Kyle [name] [name], M.D.  Kyle [name] [name]

## 2011-03-10 NOTE — Assessment & Plan Note (Signed)
OFFICE VISIT   DREXEL, VELARDO  DOB:  18-Apr-1951                                       03/05/2009  CHART#:[id number]   I saw the patient for followup after his recent left fem-pop bypass.  He  had progressive ischemia of his right lower extremity and underwent vein  patch angioplasty of the common femoral artery with profundoplasty,  right femoral to below knee popliteal artery bypass graft with a vein  graft and vein patch angioplasty of a vein graft stenosis.  He did well  postoperatively and returns for his first followup visit.   PHYSICAL EXAMINATION:  On examination blood pressure is 148/70, heart  rate is 78.  His incisions are all healing nicely.  He has a palpable  femoral and popliteal pulse.  He has known tibial occlusive disease, 58%  up from 38% preoperatively.  He has known tibial occlusive disease on  the right.   Overall I am pleased with his progress.  I have encouraged him to try to  stay off cigarettes as best he can and to get on a structured walking  program.  I will see him back in 2 months.  He knows to call sooner if  he has problems.   [name]. Scot Dock, M.D.  Electronically Signed   CSD/MEDQ  D:  03/05/2009  T:  03/06/2009  Job:  [id number]

## 2011-03-10 NOTE — Assessment & Plan Note (Signed)
OFFICE VISIT   TEVON, STAVIS  DOB:  May 19, 1951                                       05/07/2009  CHART#:[id number]   I saw the patient in the office today for followup after his recent  right lower extremity bypass.  He had presented with progressive  ischemia of his right lower extremity and on 02/20/2009 underwent vein  patch angioplasty of the right common femoral artery with profundoplasty  and right femoral to below knee pop bypass with a vein graft with vein  patch angioplasty of a vein graft stenosis.  He returns for a 3 month  followup visit.   Since I saw him last he has had no claudication, rest pain or nonhealing  ulcers.  He has been doing well without any specific complaints.   PHYSICAL EXAMINATION:  Blood pressure is 165/74, heart rate is 54.  Lungs are clear bilaterally to auscultation.  On cardiac exam he has a  regular rate and rhythm.  He has a palpable femoral, popliteal and  dorsalis pedis pulse on the right.  He has a palpable femoral pulse on  the left with a warm, well-perfused left foot.   Doppler study in our office today shows a patent bypass graft with some  increased velocities in the proximal graft at 256 cm/sec and increased  velocities distally likely related to change in caliper of the artery.  Velocity distally is 182 cm/sec.  ABI on the right is 94% up from 38%.  ABI on the left is stable at 65%.   Overall I am pleased with his progress and will follow his graft on our  protocol.  I plan on seeing him back in 1 year unless there are any  specific concerns prior to this.   [name]. Scot Dock, M.D.  Electronically Signed   CSD/MEDQ  D:  05/07/2009  T:  05/08/2009  Job:  [id number]

## 2011-03-10 NOTE — H&P (Signed)
HISTORY AND PHYSICAL EXAMINATION   February 12, 2009   Re:  KAREL, STEGE T                DOB:  02/20/1951   REASON FOR ADMISSION:  Rest pain in the right foot with multilevel  arterial occlusive disease.   HISTORY:  This is a pleasant 60 year old gentleman who I had seen in  consultation on 01/22/2009 with rest pain in the right foot.  He had had  some right calf claudication for approximately a month.  This occurs at  less than 20 yards and is relieved with rest.  He had had no significant  thigh or hip claudication.  He had had no significant symptoms on the  left side.  In early March he began experiencing pain in the right foot  at rest which was relieved somewhat with dependency.  He had had no  history of nonhealing ulcers.  His symptoms have remained relatively  stable.   PAST MEDICAL HISTORY:  Significant for hypertension and a history of  congestive heart failure.  Additionally he has a history of COPD.  He  denies any history of diabetes, hypercholesterolemia.  He did have a  myocardial infarction in December of 2009.   FAMILY HISTORY:  There is no history of premature cardiovascular  disease.   SOCIAL HISTORY:  He is married and he has one child.  He had smoked one  pack per day of cigarettes for 40 years but quit smoking in December of  2009.  He has three beers a day.   REVIEW OF SYSTEMS:  GENERAL:  He has had no recent weight loss, weight  gain or problems with his appetite.  CARDIAC:  He has had no chest pain, chest pressure, palpitations or  arrhythmias.  PULMONARY:  He has had no productive cough, bronchitis, asthma or  wheezing.  GI, GU, neuro, ortho, psychiatric, ENT and hematologic review of systems  are unremarkable.   ALLERGIES:  No known drug allergies.   MEDICATIONS:  1. Aspirin 81 mg p.o. daily.  2. Lasix 40 mg p.o. daily.  3. Potassium 20 mEq p.o. daily.  4. Carvedilol 12.5 mg p.o. b.i.d.  5. Altace 5 mg p.o. daily.  6.  Amlodipine 5 mg p.o. daily.   PHYSICAL EXAMINATION:  This is a pleasant 60 year old gentleman who  appears his stated age.  His blood pressure is 161/72, heart rate is 16.  Neck is supple.  There is no cervical lymphadenopathy.  He has a soft  left carotid bruit.  Lungs are clear bilaterally to auscultation.  On  cardiac exam he has a regular rate and rhythm.  Abdomen is soft and  nontender.  I cannot palpate an aneurysm.  He has normal pitched bowel  sounds.  He has palpable femoral pulses.  I cannot palpate a popliteal  pedal pulse on either side.  He has a monophasic peroneal and posterior  tibial signal on the right with the Doppler and a monophasic posterior  tibial and anterior tibial signal on the left.  He has no ischemic  ulcers.  The right foot is slightly cooler than the left but there are  no ischemic ulcers and no discoloration of the skin.   Doppler study on 01/16/2009 showed an ABI of 38% on the right and 62% on  the left.  Based on his exam he had multilevel arterial occlusive  disease and underwent an arteriogram.  This showed some mild diffuse  iliac disease on  the right with an occluded hypogastric artery.  Below  that he had a superficial femoral artery occlusion at its origin with  reconstitution of the below knee popliteal artery.  Of note, he had a  significant proximal stenosis of the deep femoral artery.  On the right  he has two vessel runoff via the posterior tibial artery and peroneal  artery.   Given his progressive ischemic symptoms in the right leg now with rest  pain I have recommended right lower extremity revascularization.  Given  the long segment SFA occlusion I think he would best be served with a  right femoral to below knee popliteal artery bypass graft with a vein  graft.  His vein was mapped in the office today and looks reasonable.  In addition, I think he will require a profundoplasty to address the  deep femoral artery stenosis in the right  groin.  We have discussed the  indications for surgery and the potential complications including but  not limited to bleeding, wound healing problems, graft thrombosis and  limb loss.  All of his questions were answered and he is agreeable to  proceed.  His surgery has been scheduled for 02/20/2009.   [name]. Scot Dock, M.D.  Electronically Signed   CSD/MEDQ  D:  02/12/2009  T:  02/13/2009  Job:  [id number]   cc:   Peter M. Martinique, M.D.  Dr Starla Link

## 2011-03-11 MED ORDER — QUINAPRIL HCL 10 MG PO TABS: 10.0000 mg | ORAL_TABLET | Freq: Every day | ORAL | Status: DC

## 2011-03-11 MED ORDER — AMLODIPINE BESYLATE 5 MG PO TABS: 5.0000 mg | ORAL_TABLET | Freq: Every day | ORAL | Status: DC

## 2011-03-11 MED ORDER — FUROSEMIDE 40 MG PO TABS: 40.0000 mg | ORAL_TABLET | Freq: Every day | ORAL | Status: DC

## 2011-03-11 MED ORDER — CARVEDILOL 12.5 MG PO TABS: 12.5000 mg | ORAL_TABLET | Freq: Two times a day (BID) | ORAL | Status: DC

## 2011-03-11 NOTE — Telephone Encounter (Signed)
Wife called stating he is out of his medication and the public health dept says that they won't be available for about 6 weeks. Wants Rx sent to [facility] on [address]. meds sent.

## 2011-06-22 ENCOUNTER — Encounter: Payer: Self-pay | Admitting: Cardiology

## 2011-06-30 ENCOUNTER — Other Ambulatory Visit: Payer: Self-pay | Admitting: *Deleted

## 2011-06-30 ENCOUNTER — Ambulatory Visit: Payer: Self-pay | Admitting: Cardiology

## 2011-07-24 ENCOUNTER — Encounter: Payer: Self-pay | Admitting: Cardiology

## 2011-07-31 LAB — BASIC METABOLIC PANEL
BUN: 14 mg/dL (ref 6–23)
BUN: 22 mg/dL (ref 6–23)
BUN: 24 mg/dL — ABNORMAL HIGH (ref 6–23)
CO2: 28 mEq/L (ref 19–32)
CO2: 29 mEq/L (ref 19–32)
Calcium: 8.5 mg/dL (ref 8.4–10.5)
Calcium: 8.6 mg/dL (ref 8.4–10.5)
Calcium: 8.6 mg/dL (ref 8.4–10.5)
Chloride: 100 mEq/L (ref 96–112)
Chloride: 101 mEq/L (ref 96–112)
Chloride: 108 mEq/L (ref 96–112)
Creatinine, Ser: 1.1 mg/dL (ref 0.4–1.5)
Creatinine, Ser: 1.1 mg/dL (ref 0.4–1.5)
Creatinine, Ser: 1.17 mg/dL (ref 0.4–1.5)
GFR calc Af Amer: >60 mL/min (ref >60)
GFR calc Af Amer: >60 mL/min (ref >60)
GFR calc Af Amer: >60 mL/min (ref >60)
GFR calc non Af Amer: >60 mL/min (ref >60)
GFR calc non Af Amer: >60 mL/min (ref >60)
GFR calc non Af Amer: >60 mL/min (ref >60)
Glucose, Bld: 103 mg/dL — ABNORMAL HIGH (ref 70–99)
Glucose, Bld: 110 mg/dL — ABNORMAL HIGH (ref 70–99)
Potassium: 3.9 mEq/L (ref 3.5–5.1)
Potassium: 4.1 mEq/L (ref 3.5–5.1)
Sodium: 136 mEq/L (ref 135–145)
Sodium: 141 mEq/L (ref 135–145)

## 2011-07-31 LAB — COMPREHENSIVE METABOLIC PANEL
AST: 32 U/L (ref 0–37)
Albumin: 3 g/dL — ABNORMAL LOW (ref 3.5–5.2)
Alkaline Phosphatase: 88 U/L (ref 39–117)
Chloride: 111 mEq/L (ref 96–112)
GFR calc Af Amer: >60 mL/min (ref >60)
Potassium: 4.2 mEq/L (ref 3.5–5.1)
Sodium: 144 mEq/L (ref 135–145)
Total Bilirubin: 1.2 mg/dL (ref 0.3–1.2)
Total Protein: 5.6 g/dL — ABNORMAL LOW (ref 6.0–8.3)

## 2011-07-31 LAB — POCT I-STAT 3, ART BLOOD GAS (G3+)
Acid-Base Excess: 3 mmol/L — ABNORMAL HIGH (ref 0.0–2.0)
Acid-base deficit: 2 mmol/L (ref 0.0–2.0)
Bicarbonate: 22.3 meq/L (ref 20.0–24.0)
Bicarbonate: 27.7 mEq/L — ABNORMAL HIGH (ref 20.0–24.0)
O2 Saturation: 96 %
TCO2: 23 mmol/L (ref 0–100)
pCO2 arterial: 34.9 mmHg — ABNORMAL LOW (ref 35.0–45.0)
pH, Arterial: 7.413 (ref 7.350–7.450)
pH, Arterial: 7.413 (ref 7.350–7.450)
pO2, Arterial: 60 mmHg — ABNORMAL LOW (ref 80.0–100.0)
pO2, Arterial: 82 mmHg (ref 80.0–100.0)

## 2011-07-31 LAB — LIPID PANEL
Triglycerides: 86 mg/dL (ref <150)
VLDL: 17 mg/dL (ref 0–40)

## 2011-07-31 LAB — URINALYSIS, ROUTINE W REFLEX MICROSCOPIC
Glucose, UA: NEGATIVE mg/dL
Ketones, ur: NEGATIVE mg/dL
Nitrite: NEGATIVE
Specific Gravity, Urine: 1.015 (ref 1.005–1.030)
pH: 5.5 (ref 5.0–8.0)

## 2011-07-31 LAB — DIFFERENTIAL
Basophils Absolute: 0.1 10*3/uL (ref 0.0–0.1)
Basophils Relative: 1 % (ref 0–1)
Eosinophils Relative: 1 % (ref 0–5)
Monocytes Absolute: 0.8 10*3/uL (ref 0.1–1.0)
Monocytes Relative: 11 % (ref 3–12)

## 2011-07-31 LAB — B-NATRIURETIC PEPTIDE (CONVERTED LAB)
Pro B Natriuretic peptide (BNP): 1877 pg/mL — ABNORMAL HIGH (ref 0.0–100.0)
Pro B Natriuretic peptide (BNP): 2059 pg/mL — ABNORMAL HIGH (ref 0.0–100.0)
Pro B Natriuretic peptide (BNP): 483 pg/mL — ABNORMAL HIGH (ref 0.0–100.0)
Pro B Natriuretic peptide (BNP): 971 pg/mL — ABNORMAL HIGH (ref 0.0–100.0)

## 2011-07-31 LAB — PROTIME-INR
INR: 1.2 (ref 0.00–1.49)
Prothrombin Time: 15.5 seconds — ABNORMAL HIGH (ref 11.6–15.2)

## 2011-07-31 LAB — HEMOGLOBIN A1C
Hgb A1c MFr Bld: 6.7 % — ABNORMAL HIGH (ref 4.6–6.1)
Mean Plasma Glucose: 146 mg/dL

## 2011-07-31 LAB — CARDIAC PANEL(CRET KIN+CKTOT+MB+TROPI)
CK, MB: 4.7 ng/mL — ABNORMAL HIGH (ref 0.3–4.0)
Relative Index: 4.7 — ABNORMAL HIGH (ref 0.0–2.5)
Total CK: 101 U/L (ref 7–232)
Troponin I: 0.1 ng/mL — ABNORMAL HIGH (ref 0.00–0.06)

## 2011-07-31 LAB — TROPONIN I: Troponin I: 0.08 ng/mL — ABNORMAL HIGH (ref 0.00–0.06)

## 2011-07-31 LAB — POCT I-STAT 3, VENOUS BLOOD GAS (G3P V)
Acid-base deficit: 1 mmol/L (ref 0.0–2.0)
Bicarbonate: 26.5 mEq/L — ABNORMAL HIGH (ref 20.0–24.0)
pH, Ven: 7.31 — ABNORMAL HIGH (ref 7.250–7.300)

## 2011-07-31 LAB — CBC
HCT: 47.5 % (ref 39.0–52.0)
Hemoglobin: 15.7 g/dL (ref 13.0–17.0)
MCHC: 33 g/dL (ref 30.0–36.0)
MCV: 107.7 fL — ABNORMAL HIGH (ref 78.0–100.0)
Platelets: 280 10*3/uL (ref 150–400)
RBC: 4.41 MIL/uL (ref 4.22–5.81)
RDW: 16.9 % — ABNORMAL HIGH (ref 11.5–15.5)
WBC: 7.5 10*3/uL (ref 4.0–10.5)

## 2011-07-31 LAB — CK TOTAL AND CKMB (NOT AT ARMC): Relative Index: 4.3 — ABNORMAL HIGH (ref 0.0–2.5)

## 2011-07-31 LAB — MAGNESIUM: Magnesium: 2.2 mg/dL (ref 1.5–2.5)

## 2011-07-31 LAB — APTT: aPTT: 34 seconds (ref 24–37)

## 2011-09-05 ENCOUNTER — Other Ambulatory Visit: Payer: Self-pay | Admitting: Cardiology

## 2011-09-07 ENCOUNTER — Telehealth: Payer: Self-pay | Admitting: Cardiology

## 2011-09-07 NOTE — Telephone Encounter (Signed)
Furosimide 40mg , accupril 10mg , carvedilol 12mg , amlodipine 5mg  uses [facility]

## 2011-10-02 ENCOUNTER — Ambulatory Visit (INDEPENDENT_AMBULATORY_CARE_PROVIDER_SITE_OTHER): Payer: Self-pay | Admitting: Cardiology

## 2011-10-02 ENCOUNTER — Encounter: Payer: Self-pay | Admitting: Cardiology

## 2011-10-02 VITALS — BP 132/66 | HR 64 | Ht 71.0 in | Wt 165.0 lb

## 2011-10-02 DIAGNOSIS — I509 Heart failure, unspecified: Secondary | ICD-10-CM

## 2011-10-02 DIAGNOSIS — I428 Other cardiomyopathies: Secondary | ICD-10-CM

## 2011-10-02 DIAGNOSIS — I359 Nonrheumatic aortic valve disorder, unspecified: Secondary | ICD-10-CM

## 2011-10-02 DIAGNOSIS — E1159 Type 2 diabetes mellitus with other circulatory complications: Secondary | ICD-10-CM | POA: Insufficient documentation

## 2011-10-02 DIAGNOSIS — E7800 Pure hypercholesterolemia, unspecified: Secondary | ICD-10-CM

## 2011-10-02 DIAGNOSIS — I5022 Chronic systolic (congestive) heart failure: Secondary | ICD-10-CM | POA: Insufficient documentation

## 2011-10-02 DIAGNOSIS — I10 Essential (primary) hypertension: Secondary | ICD-10-CM

## 2011-10-02 DIAGNOSIS — I739 Peripheral vascular disease, unspecified: Secondary | ICD-10-CM

## 2011-10-02 DIAGNOSIS — I420 Dilated cardiomyopathy: Secondary | ICD-10-CM

## 2011-10-02 DIAGNOSIS — I351 Nonrheumatic aortic (valve) insufficiency: Secondary | ICD-10-CM | POA: Insufficient documentation

## 2011-10-02 MED ORDER — QUINAPRIL HCL 10 MG PO TABS: 10.0000 mg | ORAL_TABLET | Freq: Every day | ORAL | Status: DC

## 2011-10-02 MED ORDER — PRAVASTATIN SODIUM 80 MG PO TABS: 80.0000 mg | ORAL_TABLET | Freq: Every day | ORAL | Status: DC

## 2011-10-02 MED ORDER — AMLODIPINE BESYLATE 5 MG PO TABS: 5.0000 mg | ORAL_TABLET | Freq: Every day | ORAL | Status: DC

## 2011-10-02 MED ORDER — CARVEDILOL 12.5 MG PO TABS: 12.5000 mg | ORAL_TABLET | Freq: Two times a day (BID) | ORAL | Status: DC

## 2011-10-02 NOTE — Assessment & Plan Note (Addendum)
When initially diagnosed he had an ejection fraction of 20-25%. His most recent echocardiogram in June of 2011 showed normal LV function with an ejection fraction of 55-60%. He is asymptomatic. I recommended stopping his Lasix at this point. He will remain on his ACE inhibitor and beta blocker therapy. We will schedule him for fasting lab work including chemistries and lipid panel.

## 2011-10-02 NOTE — Assessment & Plan Note (Signed)
" >>  ASSESSMENT AND PLAN FOR PERIPHERAL ARTERIAL DISEASE WRITTEN ON 10/02/2011  5:41 PM BY Ashland Osmer, MAUDE HERO, MD  Status post right femoropopliteal bypass. He is asymptomatic. Continue risk factor modification. He is no longer smoking. "

## 2011-10-02 NOTE — Assessment & Plan Note (Signed)
Blood pressure is well controlled. I renewed his antihypertensive therapy today.

## 2011-10-02 NOTE — Patient Instructions (Addendum)
Stop taking Lasix. You may use it as needed for swelling.  We will schedule you for fasting lab work  Continue your other medications.  I will see you again in 1 year.  Appointment for fasting lab work on Monday December 17. Labs open at 8:30. Nothing to eat or drink after midnight. May have black coffee and take medication with sip of water.

## 2011-10-02 NOTE — Assessment & Plan Note (Signed)
He has a history of a bicuspid aortic valve with moderate insufficiency. I would anticipate a followup echocardiogram next year.

## 2011-10-02 NOTE — Progress Notes (Signed)
   Kyle [name] Date of Birth: 09/26/1951 Medical Record [id number]  History of Present Illness: Kyle [name] is seen today for yearly followup. He states he is doing very well. He has been employed by [employer] and is working hard. He denies any shortness of breath, orthopnea, PND, or edema. He's had no chest pain or palpitations. He denies any claudication symptoms. He has lost 22 pounds over the past year. He feels very well.  Current Outpatient Prescriptions on File Prior to Visit  Medication Sig Dispense Refill  . aspirin 81 MG chewable tablet Chew 81 mg by mouth daily.        . furosemide (LASIX) 40 MG tablet TAKE ONE TABLET BY MOUTH EVERY DAY  30 tablet  1    No Known Allergies  Past Medical History  Diagnosis Date  . Aortic insufficiency     MODERATE WITH A BICUSPID AORTIC VAVLE  . Hypertension   . Peripheral arterial disease   . Dilated cardiomyopathy     WITH EJECTION FRACTION DOWN TO 20-25%--WITH CONGESTIVE HEART FAILURE  . CHF (congestive heart failure)   . SOB (shortness of breath)   . Orthopnea   . Edema     LOWER EXTREMETIES  . COPD (chronic obstructive pulmonary disease)   . Arterial occlusive disease     MULTILEVEL    Past Surgical History  Procedure Date  . Other surgical history     POST RIGHT FEMOROPOPLITEAL BYPASS GRAFT  . Colonoscopy   . Cardiac catheterization     EJECTION FRACTION 20-25%  . Femoral-popliteal bypass graft     right    History  Smoking status  . Former Smoker -- 1.0 packs/day  Smokeless tobacco  . Not on file  Comment: quit 2010    History  Alcohol Use  . Yes    Family History  Problem Relation Age of Onset  . Diabetes Mother     Review of Systems: As noted in history of present illness.  All other systems were reviewed and are negative.  Physical Exam: BP 132/66  Pulse 64  Ht 5\' 11"  (1.803 m)  Wt 165 lb (74.844 kg)  BMI 23.01 kg/m2 He is a pleasant white male in no acute distress. He is normocephalic,  atraumatic. Pupils are equal round and reactive to light accommodation. Extraocular movements are full. Oropharynx is clear. Neck is supple without JVD, adenopathy, thyromegaly, or bruits. Lungs are clear. Cardiac exam reveals a soft grade 1/6 systolic ejection murmur left sternal border. There is a soft grade 2/6 diastolic murmur at the upper left sternal border. There is no gallop. Abdomen is soft and nontender. His feet are warm and dry. He has good pedal pulses. LABORATORY DATA:  ECG today shows normal sinus rhythm with a normal ECG. Assessment / Plan:

## 2011-10-02 NOTE — Assessment & Plan Note (Signed)
Status post right femoropopliteal bypass. He is asymptomatic. Continue risk factor modification. He is no longer smoking.

## 2011-10-09 ENCOUNTER — Telehealth: Payer: Self-pay | Admitting: *Deleted

## 2011-10-09 ENCOUNTER — Other Ambulatory Visit (INDEPENDENT_AMBULATORY_CARE_PROVIDER_SITE_OTHER): Payer: Self-pay | Admitting: *Deleted

## 2011-10-09 DIAGNOSIS — E7800 Pure hypercholesterolemia, unspecified: Secondary | ICD-10-CM

## 2011-10-09 LAB — LIPID PANEL
Cholesterol: 132 mg/dL (ref 0–200)
LDL Cholesterol: 81 mg/dL (ref 0–99)
Total CHOL/HDL Ratio: 4
Triglycerides: 69 mg/dL (ref 0.0–149.0)
VLDL: 13.8 mg/dL (ref 0.0–40.0)

## 2011-10-09 NOTE — Telephone Encounter (Signed)
Message copied by Hetty Blend on Fri Oct 09, 2011  4:09 PM ------      Message from: Martinique, PETER M      Created: Fri Oct 09, 2011  3:25 PM       Lipids look very good. Continue Rx.      Collier Salina Martinique

## 2011-10-09 NOTE — Telephone Encounter (Signed)
Notified wife of lab results.

## 2011-10-12 ENCOUNTER — Other Ambulatory Visit: Payer: Self-pay | Admitting: *Deleted

## 2011-12-30 ENCOUNTER — Ambulatory Visit: Payer: Self-pay | Admitting: Vascular Surgery

## 2012-02-02 ENCOUNTER — Encounter: Payer: Self-pay | Admitting: Neurosurgery

## 2012-02-03 ENCOUNTER — Ambulatory Visit (INDEPENDENT_AMBULATORY_CARE_PROVIDER_SITE_OTHER): Payer: Self-pay | Admitting: Neurosurgery

## 2012-02-03 ENCOUNTER — Encounter: Payer: Self-pay | Admitting: Neurosurgery

## 2012-02-03 ENCOUNTER — Encounter (INDEPENDENT_AMBULATORY_CARE_PROVIDER_SITE_OTHER): Payer: Self-pay | Admitting: *Deleted

## 2012-02-03 ENCOUNTER — Ambulatory Visit (INDEPENDENT_AMBULATORY_CARE_PROVIDER_SITE_OTHER): Payer: Self-pay | Admitting: *Deleted

## 2012-02-03 VITALS — BP 186/85 | HR 73 | Resp 16 | Ht 71.0 in | Wt 169.9 lb

## 2012-02-03 DIAGNOSIS — I739 Peripheral vascular disease, unspecified: Secondary | ICD-10-CM

## 2012-02-03 DIAGNOSIS — Z48812 Encounter for surgical aftercare following surgery on the circulatory system: Secondary | ICD-10-CM

## 2012-02-03 NOTE — Progress Notes (Signed)
[facility] [place] HISTORY AND PHYSICAL   CC: Annual lower extremity bypass graft evaluation Referring Physician: Scot Dock  History of Present Illness: 61 year old male patient of [name] here for annual lower arterial bypass graft evaluation, the patient reports to be asymptomatic, no lower extremity pain with walking or at rest, no open wounds in the lower extremities. Patient has no new medical problems and no new surgical history.  Past Medical History  Diagnosis Date  . Aortic insufficiency     MODERATE WITH A BICUSPID AORTIC VAVLE  . Hypertension   . Peripheral arterial disease   . Dilated cardiomyopathy     WITH EJECTION FRACTION DOWN TO 20-25%--WITH CONGESTIVE HEART FAILURE  . CHF (congestive heart failure)   . SOB (shortness of breath)   . Orthopnea   . Edema     LOWER EXTREMETIES  . COPD (chronic obstructive pulmonary disease)   . Arterial occlusive disease     MULTILEVEL    ROS: [x]  Positive   [ ]  Denies    General: [ ]  Weight loss, [ ]  Fever, [ ]  chills Neurologic: [ ]  Dizziness, [ ]  Blackouts, [ ]  Seizure [ ]  Stroke, [ ]  "Mini stroke", [ ]  Slurred speech, [ ]  Temporary blindness; [ ]  weakness in arms or legs, [ ]  Hoarseness Cardiac: [ ]  Chest pain/pressure, [ ]  Shortness of breath at rest [ ]  Shortness of breath with exertion, [ ]  Atrial fibrillation or irregular heartbeat Vascular: [ ]  Pain in legs with walking, [ ]  Pain in legs at rest, [ ]  Pain in legs at night,  [ ]  Non-healing ulcer, [ ]  Blood clot in vein/DVT,   Pulmonary: [ ]  Home oxygen, [ ]  Productive cough, [ ]  Coughing up blood, [ ]  Asthma,  [ ]  Wheezing Musculoskeletal:  [ ]  Arthritis, [ ]  Low back pain, [ ]  Joint pain Hematologic: [ ]  Easy Bruising, [ ]  Anemia; [ ]  Hepatitis Gastrointestinal: [ ]  Blood in stool, [ ]  Gastroesophageal Reflux/heartburn, [ ]  Trouble swallowing Urinary: [ ]  chronic Kidney disease, [ ]  on HD - [ ]  MWF or [ ]  TTHS, [ ]  Burning with urination, [ ]   Difficulty urinating Skin: [ ]  Rashes, [ ]  Wounds Psychological: [ ]  Anxiety, [ ]  Depression   Social History History  Substance Use Topics  . Smoking status: Former Smoker -- 1.0 packs/day  . Smokeless tobacco: Not on file   Comment: quit 2010  . Alcohol Use: Yes    Family History Family History  Problem Relation Age of Onset  . Diabetes Mother   . Hyperlipidemia Sister   . Hypertension Sister     No Known Allergies  Current Outpatient Prescriptions  Medication Sig Dispense Refill  . amLODipine (NORVASC) 5 MG tablet Take 1 tablet (5 mg total) by mouth daily.  30 tablet  11  . aspirin 81 MG chewable tablet Chew 81 mg by mouth daily.        . carvedilol (COREG) 12.5 MG tablet Take 1 tablet (12.5 mg total) by mouth 2 (two) times daily with a meal.  60 tablet  11  . pravastatin (PRAVACHOL) 80 MG tablet Take 1 tablet (80 mg total) by mouth daily.  30 tablet  11  . quinapril (ACCUPRIL) 10 MG tablet Take 1 tablet (10 mg total) by mouth daily.  30 tablet  11  . furosemide (LASIX) 40 MG tablet TAKE ONE TABLET BY MOUTH EVERY DAY  30 tablet  1    Physical Examination  Filed Vitals:   02/03/12 1541  BP: 186/85  Pulse: 73  Resp: 16    Body mass index is 23.70 kg/(m^2).  General:  WDWN in NAD Gait: Normal HEENT: WNL Eyes: Pupils equal Pulmonary: normal non-labored breathing , without Rales, rhonchi,  wheezing Cardiac: RRR, without  Murmurs, rubs or gallops; No carotid bruits Abdomen: soft, NT, no masses Skin: no rashes, ulcers noted Vascular Exam/Pulses: PT and DP pulses bilaterally are palpable but diminished, he has 2+ bilateral radial pulses, I do feel a diminished popliteal pulse on the right nothing on the left  Extremities without ischemic changes, no Gangrene , no cellulitis; no open wounds;  Musculoskeletal: no muscle wasting or atrophy  Neurologic: A&O X 3; Appropriate Affect ; SENSATION: normal; MOTOR FUNCTION:  moving all extremities equally. Speech is  fluent/normal  Non-Invasive Vascular Imaging:: Fluoroscopy arterial ABIs showed today he is 0.82 on the right 0.53 on the left which is not a significant change from February 2012 when he was 0.84 on the right and 0.59 on the left area and this was reviewed with [name] and he is in concurrence with me with concern over 80 increased velocity of 353 just proximal to the graft in the right leg.  ASSESSMENT/PLAN: Assessment as above/per [name] we'll bring the patient back in 3 months for repeat lower extremity studies he'll make a determination at that time if intervention is necessary, I did instruct the patient if he has any difficulties prior to that time to please let us know and we get them in sooner, the patient and his wife's questions were encouraged and answered  [facility] ANP  Clinic M.D.: Scot Dock

## 2012-02-04 NOTE — Progress Notes (Signed)
Addended by: Mena Goes on: 02/04/2012 03:20 PM   Modules accepted: Orders

## 2012-02-10 NOTE — Procedures (Unsigned)
BYPASS GRAFT EVALUATION  INDICATION:  Followup right fem-pop graft.  HISTORY: Diabetes:  No Cardiac:  No Hypertension:  Yes Smoking:  Previous Previous Surgery:  Right fem-pop graft placed 02/20/2009  SINGLE LEVEL ARTERIAL EXAM                              RIGHT              LEFT Brachial: Anterior tibial: Posterior tibial: Peroneal: Ankle/brachial index:        0.82               0.53  PREVIOUS ABI:  Date: 12/24/2010  RIGHT:  0.84  LEFT:  0.59  LOWER EXTREMITY BYPASS GRAFT DUPLEX EXAM:  DUPLEX: 1. Widely patent right fem-pop graft with significant stenosis of the     native inflow artery.  Waveforms throughout the graft are     monophasic. 2. Please see diagram for velocities.  IMPRESSION: 1. Widely patent right fem-pop graft without evidence of restenosis or     hyperplasia. 2. Significant stenosis is observed in the native inflow artery.  ___________________________________________ [name]. Scot Dock, M.D.  LT/MEDQ  D:  02/04/2012  T:  02/04/2012  Job:  [id number]

## 2012-03-23 ENCOUNTER — Telehealth: Payer: Self-pay

## 2012-03-23 MED ORDER — PRAVASTATIN SODIUM 40 MG PO TABS: ORAL_TABLET | ORAL | Status: DC

## 2012-03-23 NOTE — Telephone Encounter (Signed)
Patient's wife called was told received fax from [facility] wanting to clarify pravachol mg and directions.Wife stated patient takes pravachol 40 mg 2 tablets every night.Prescription sent to [facility].

## 2012-05-10 ENCOUNTER — Encounter: Payer: Self-pay | Admitting: Neurosurgery

## 2012-05-11 ENCOUNTER — Encounter (INDEPENDENT_AMBULATORY_CARE_PROVIDER_SITE_OTHER): Payer: Self-pay | Admitting: *Deleted

## 2012-05-11 ENCOUNTER — Encounter: Payer: Self-pay | Admitting: Neurosurgery

## 2012-05-11 ENCOUNTER — Ambulatory Visit (INDEPENDENT_AMBULATORY_CARE_PROVIDER_SITE_OTHER): Payer: Self-pay | Admitting: Neurosurgery

## 2012-05-11 VITALS — BP 190/96 | HR 73 | Resp 18 | Ht 71.0 in | Wt 160.0 lb

## 2012-05-11 DIAGNOSIS — I70219 Atherosclerosis of native arteries of extremities with intermittent claudication, unspecified extremity: Secondary | ICD-10-CM | POA: Insufficient documentation

## 2012-05-11 DIAGNOSIS — I739 Peripheral vascular disease, unspecified: Secondary | ICD-10-CM

## 2012-05-11 DIAGNOSIS — Z48812 Encounter for surgical aftercare following surgery on the circulatory system: Secondary | ICD-10-CM

## 2012-05-11 NOTE — Progress Notes (Signed)
[facility] [place] PAD/PVD Office Note  CC: Three-month followup for right lower extremity bypass graft and ABIs Referring Physician: Scot Dock  History of Present Illness: 61 year old male patient of [name] who I saw in April for followup status post a right femoropopliteal graft in April 2010. Patient denies claudication, rest pain and has no open ulcerations. The patient states he has no disruption in his ADLs. The patient denies any new medical diagnoses or recent surgeries.  Past Medical History  Diagnosis Date  . Aortic insufficiency     MODERATE WITH A BICUSPID AORTIC VAVLE  . Hypertension   . Dilated cardiomyopathy     WITH EJECTION FRACTION DOWN TO 20-25%--WITH CONGESTIVE HEART FAILURE  . CHF (congestive heart failure)   . SOB (shortness of breath)   . Orthopnea   . Edema     LOWER EXTREMETIES  . COPD (chronic obstructive pulmonary disease)   . Arterial occlusive disease     MULTILEVEL  . Peripheral arterial disease     ROS: [x]  Positive   [ ]  Denies    General: [ ]  Weight loss, [ ]  Fever, [ ]  chills Neurologic: [ ]  Dizziness, [ ]  Blackouts, [ ]  Seizure [ ]  Stroke, [ ]  "Mini stroke", [ ]  Slurred speech, [ ]  Temporary blindness; [ ]  weakness in arms or legs, [ ]  Hoarseness Cardiac: [ ]  Chest pain/pressure, [ ]  Shortness of breath at rest [ ]  Shortness of breath with exertion, [ ]  Atrial fibrillation or irregular heartbeat Vascular: [ ]  Pain in legs with walking, [ ]  Pain in legs at rest, [ ]  Pain in legs at night,  [ ]  Non-healing ulcer, [ ]  Blood clot in vein/DVT,   Pulmonary: [ ]  Home oxygen, [ ]  Productive cough, [ ]  Coughing up blood, [ ]  Asthma,  [ ]  Wheezing Musculoskeletal:  [ ]  Arthritis, [ ]  Low back pain, [ ]  Joint pain Hematologic: [ ]  Easy Bruising, [ ]  Anemia; [ ]  Hepatitis Gastrointestinal: [ ]  Blood in stool, [ ]  Gastroesophageal Reflux/heartburn, [ ]  Trouble swallowing Urinary: [ ]  chronic Kidney disease, [ ]  on HD - [ ]  MWF or  [ ]  TTHS, [ ]  Burning with urination, [ ]  Difficulty urinating Skin: [ ]  Rashes, [ ]  Wounds Psychological: [ ]  Anxiety, [ ]  Depression   Social History History  Substance Use Topics  . Smoking status: Former Smoker -- 1.0 packs/day  . Smokeless tobacco: Not on file   Comment: quit 2010  . Alcohol Use: Yes    Family History Family History  Problem Relation Age of Onset  . Diabetes Mother   . Hyperlipidemia Sister   . Hypertension Sister     No Known Allergies  Current Outpatient Prescriptions  Medication Sig Dispense Refill  . amLODipine (NORVASC) 5 MG tablet Take 1 tablet (5 mg total) by mouth daily.  30 tablet  11  . aspirin 81 MG chewable tablet Chew 81 mg by mouth daily.        . carvedilol (COREG) 12.5 MG tablet Take 1 tablet (12.5 mg total) by mouth 2 (two) times daily with a meal.  60 tablet  11  . pravastatin (PRAVACHOL) 40 MG tablet Take 2 tablets every night  60 tablet  11  . quinapril (ACCUPRIL) 10 MG tablet Take 1 tablet (10 mg total) by mouth daily.  30 tablet  11  . furosemide (LASIX) 40 MG tablet TAKE ONE TABLET BY MOUTH EVERY DAY  30 tablet  1  Physical Examination  Filed Vitals:   05/11/12 1510  BP: 190/96  Pulse: 73  Resp: 18    Body mass index is 22.32 kg/(m^2).  General:  WDWN in NAD Gait: Normal HEENT: WNL Eyes: Pupils equal Pulmonary: normal non-labored breathing , without Rales, rhonchi,  wheezing Cardiac: RRR, without  Murmurs, rubs or gallops; No carotid bruits Abdomen: soft, NT, no masses Skin: no rashes, ulcers noted Vascular Exam/Pulses: 3+ femoral pulses bilaterally with palpable PT and DP pulses bilaterally  Extremities without ischemic changes, no Gangrene , no cellulitis; no open wounds;  Musculoskeletal: no muscle wasting or atrophy  Neurologic: A&O X 3; Appropriate Affect ; SENSATION: normal; MOTOR FUNCTION:  moving all extremities equally. Speech is fluent/normal  Non-Invasive Vascular Imaging: ABIs today are 0.87 and  triphasic to biphasic on the right, 0.59 and monophasic on the left, duplex graft scan shows widely patent graft with a proximal narrowing and velocities no greater than 270.  ASSESSMENT/PLAN: Stable, asymptomatic patient 3 year status post right femoropopliteal graft. The patient will return in 6 months for repeat ABIs and duplex bypass graft scan, [name] reviewed these findings. The patient's questions were encouraged and answered, he is in agreement with this plan.  Beatris Ship ANP  Clinic M.D.: Scot Dock

## 2012-05-11 NOTE — Addendum Note (Signed)
Addended by: Mena Goes on: 05/11/2012 03:58 PM   Modules accepted: Orders

## 2012-05-19 NOTE — Procedures (Unsigned)
BYPASS GRAFT EVALUATION  INDICATION:  Followup, right femoral to popliteal graft.  HISTORY: Diabetes:  No Cardiac:  No Hypertension:  Yes Smoking:  Previous Previous Surgery:  Right femoral to popliteal graft placed 02/20/2009  SINGLE LEVEL ARTERIAL EXAM                              RIGHT              LEFT Brachial: Anterior tibial: Posterior tibial: Peroneal: Ankle/brachial index:        0.87               0.59  PREVIOUS ABI:  Date: 02/03/2012  RIGHT:  0.82  LEFT:  0.53  LOWER EXTREMITY BYPASS GRAFT DUPLEX EXAM:  DUPLEX:  Widely patent right femoral to popliteal graft with significant stenosis of the native inflow artery.  Waveforms throughout the graft are monophasic.  Please see the following diagram for velocity measurements.  IMPRESSION: 1. Widely patent right femoral to popliteal graft without evidence of     restenosis or hyperplasia. 2. Significant stenosis is observed in the native inflow artery.  ___________________________________________ [name]. Scot Dock, M.D.  EM/MEDQ  D:  05/12/2012  T:  05/12/2012  Job:  [id number]

## 2012-11-11 ENCOUNTER — Ambulatory Visit: Payer: Self-pay | Admitting: Neurosurgery

## 2012-12-21 ENCOUNTER — Ambulatory Visit: Payer: Self-pay | Admitting: Neurosurgery

## 2013-01-03 ENCOUNTER — Encounter: Payer: Self-pay | Admitting: Neurosurgery

## 2013-01-04 ENCOUNTER — Ambulatory Visit: Payer: Self-pay | Admitting: Neurosurgery

## 2013-07-12 ENCOUNTER — Telehealth: Payer: Self-pay | Admitting: *Deleted

## 2013-07-12 NOTE — Telephone Encounter (Signed)
Pt states he wants a refill on carvedilol, pravastatin and Norvasc His wife states there was some change in the directions on pravastatin. Last note states 40 mg 2 tabs po qd..the patient states he has been taken 1/2 tab po qd. Will route this to Jordan's nurse to verify what he needs to be on Also pt has not been seen since 2012 can we give enough till appt

## 2013-07-13 MED ORDER — PRAVASTATIN SODIUM 40 MG PO TABS: ORAL_TABLET | ORAL | Status: DC

## 2013-07-13 MED ORDER — AMLODIPINE BESYLATE 5 MG PO TABS: 5.0000 mg | ORAL_TABLET | Freq: Every day | ORAL | Status: DC

## 2013-07-13 MED ORDER — CARVEDILOL 12.5 MG PO TABS: 12.5000 mg | ORAL_TABLET | Freq: Two times a day (BID) | ORAL | Status: DC

## 2013-07-13 NOTE — Telephone Encounter (Signed)
Spoke to patient's wife she stated husband has appointment with Dr.Jordan 09/07/13.Stated he needs refills on pravastatin,carvedilol,norvasc.Refills sent to pharmacy.

## 2013-09-07 ENCOUNTER — Ambulatory Visit: Payer: Self-pay | Admitting: Cardiology

## 2013-10-30 ENCOUNTER — Ambulatory Visit: Payer: Self-pay | Admitting: Cardiology

## 2013-12-12 ENCOUNTER — Ambulatory Visit: Payer: Self-pay | Admitting: Cardiology

## 2014-01-18 ENCOUNTER — Ambulatory Visit: Payer: Self-pay | Admitting: Cardiology

## 2014-08-20 ENCOUNTER — Ambulatory Visit (INDEPENDENT_AMBULATORY_CARE_PROVIDER_SITE_OTHER): Payer: No Typology Code available for payment source | Admitting: Cardiology

## 2014-08-20 ENCOUNTER — Encounter: Payer: Self-pay | Admitting: Cardiology

## 2014-08-20 VITALS — BP 180/90 | HR 109 | Ht 71.0 in | Wt 168.0 lb

## 2014-08-20 DIAGNOSIS — I739 Peripheral vascular disease, unspecified: Secondary | ICD-10-CM

## 2014-08-20 DIAGNOSIS — I351 Nonrheumatic aortic (valve) insufficiency: Secondary | ICD-10-CM

## 2014-08-20 DIAGNOSIS — I10 Essential (primary) hypertension: Secondary | ICD-10-CM

## 2014-08-20 DIAGNOSIS — I420 Dilated cardiomyopathy: Secondary | ICD-10-CM

## 2014-08-20 DIAGNOSIS — I5022 Chronic systolic (congestive) heart failure: Secondary | ICD-10-CM

## 2014-08-20 MED ORDER — FUROSEMIDE 40 MG PO TABS: 40.0000 mg | ORAL_TABLET | Freq: Every day | ORAL | Status: DC

## 2014-08-20 MED ORDER — QUINAPRIL HCL 10 MG PO TABS: 10.0000 mg | ORAL_TABLET | Freq: Every day | ORAL | Status: DC

## 2014-08-20 MED ORDER — CARVEDILOL 12.5 MG PO TABS: 12.5000 mg | ORAL_TABLET | Freq: Two times a day (BID) | ORAL | Status: DC

## 2014-08-20 NOTE — Progress Notes (Signed)
Ander Gaster Date of Birth: 07-19-1951 Medical Record [id number]  History of Present Illness: [name] is seen to reestablish care. He is a 63 yo BM last seen in Dec. 2012. He has a history of systolic CHF with EF of 0000000 in May 2012. Coronary anatomy was normal by cath. He has a bicuspid AV with moderate AI. He had moderate pulmonary HTN. He was treated medically with marked improvement and follow up Echo showing improvement in EF to 55-60%. Unfortunately , he has been lost to follow up and has not taken any medication in over 2 years. He also has a history of HTN, hyperlipidemia, and PAD s/p right fem-pop BPG.  On follow up today he complains of symptoms of increased SOB and leg edema. He coughs a lot which is nonproductive. His dyspnea and chest tightness is worse with exertion. He is no longer smoking. He does note some tightness in his legs with walking.   Medications: none  No Known Allergies  Past Medical History  Diagnosis Date  . Aortic insufficiency     MODERATE WITH A BICUSPID AORTIC VAVLE  . Hypertension   . Dilated cardiomyopathy     WITH EJECTION FRACTION DOWN TO 20-25%--WITH CONGESTIVE HEART FAILURE  . CHF (congestive heart failure)   . SOB (shortness of breath)   . Orthopnea   . Edema     LOWER EXTREMETIES  . COPD (chronic obstructive pulmonary disease)   . Arterial occlusive disease     MULTILEVEL  . Peripheral arterial disease     Past Surgical History  Procedure Laterality Date  . Other surgical history      POST RIGHT FEMOROPOPLITEAL BYPASS GRAFT  . Colonoscopy    . Cardiac catheterization      EJECTION FRACTION 20-25%  . Femoral-popliteal bypass graft  02/21/2011    right    History   Social History  . Marital Status: Married    Spouse Name: N/A    Number of Children: 1  . Years of Education: N/A   Occupational History  .  [employer]   Social History Main Topics  . Smoking status: Former Smoker -- 1.00 packs/day  . Smokeless  tobacco: None     Comment: quit 2010  . Alcohol Use: Yes  . Drug Use: No  . Sexual Activity: None   Other Topics Concern  . None   Social History Narrative  . None    Family History  Problem Relation Age of Onset  . Diabetes Mother   . Hyperlipidemia Sister   . Hypertension Sister     Review of Systems: As noted in HPI.  All other systems were reviewed and are negative.  Physical Exam: BP 180/90  Pulse 109  Ht 5\' 11"  (1.803 m)  Wt 168 lb (76.204 kg)  BMI 23.44 kg/m2 Filed Weights   08/20/14 1043  Weight: 168 lb (76.204 kg)  GENERAL:  Well appearing, thin BM in NAD HEENT:  PERRL, EOMI, sclera are clear. Oropharynx is clear. NECK:  JVD to the jaw, carotid upstroke brisk and symmetric, no bruits, no thyromegaly or adenopathy LUNGS:  Diminished BS in the bases bilaterally without rales.  CHEST:  Unremarkable HEART:  RRR,  PMI displaced laterally,S1 and S2 within normal limits, positive summation gallop. No murmur. ABD:  Soft, nontender. BS +, no masses or bruits. No hepatomegaly, no splenomegaly EXT:  2 + pulses throughout, 1-2+ pitting edema. SKIN:  Warm and dry.  No rashes NEURO:  Alert and  oriented x 3. Cranial nerves II through XII intact. PSYCH:  Cognitively intact    LABORATORY DATA: Ecg: today shows NSR with rate 103 bpm. LVH with repolarization abnormality. biatrial enlargement. I have personally reviewed and interpreted this study.   Assessment / Plan: 1. Acute systolic CHF. Patient has summation gallop, edema, JVD. Probable recurrence of dilated cardiomyopathy due to uncontrolled HTN and AI. Will resume ACE with quinapril 10 mg daily, start carvedilol 12.5 mg bid. Start lasix 40 mg daily. Will check complete lab work today including CBC, chemistries, lipids, TSH, and BNP. Will schedule for an echocardiogram. Doubt ischemic etiology with normal coronaries by cath in past. Follow up in 3 weeks with repeat BMET and BNP. Recommend sodium restriction.   2. HTN  uncontrolled. Will initiate therapy as noted. Close follow up with titration of meds as tolerated.   3. PAD s/p right fem-pop bypass. Stable claudication. Will update LE arterial dopplers.   4. Hyperlipidemia. Probably needs to be on a statin but will await lab work.   3. Biscuspid AV with moderate AI. Follow up Echo.

## 2014-08-20 NOTE — Patient Instructions (Signed)
Avoid salt/sodium  We will resume the following medications: ASA 81 mg daily, Furosemide 40 mg daily for fluid, Carvedilol 12.5 mg twice a day, and quinapril 10 mg daily.  We will schedule you for an Echocardiogram and lower extremity arterial doppler.  We will check lab work today.  We will arrange follow up in 3 weeks

## 2014-08-21 LAB — HEPATIC FUNCTION PANEL
ALK PHOS: 101 U/L (ref 39–117)
ALT: 38 U/L (ref 0–53)
AST: 20 U/L (ref 0–37)
Albumin: 3.3 g/dL — ABNORMAL LOW (ref 3.5–5.2)
BILIRUBIN DIRECT: 0.6 mg/dL — AB (ref 0.0–0.3)
BILIRUBIN INDIRECT: 1.1 mg/dL (ref 0.2–1.2)
BILIRUBIN TOTAL: 1.7 mg/dL — AB (ref 0.2–1.2)
Total Protein: 6.1 g/dL (ref 6.0–8.3)

## 2014-08-21 LAB — CBC WITH DIFFERENTIAL/PLATELET
BASOS PCT: 0 % (ref 0–1)
Basophils Absolute: 0 10*3/uL (ref 0.0–0.1)
Eosinophils Absolute: 0.1 10*3/uL (ref 0.0–0.7)
Eosinophils Relative: 1 % (ref 0–5)
HEMATOCRIT: 44.2 % (ref 39.0–52.0)
Hemoglobin: 14.1 g/dL (ref 13.0–17.0)
LYMPHS PCT: 30 % (ref 12–46)
Lymphs Abs: 2.5 10*3/uL (ref 0.7–4.0)
MCH: 29.6 pg (ref 26.0–34.0)
MCHC: 31.9 g/dL (ref 30.0–36.0)
MCV: 92.7 fL (ref 78.0–100.0)
MONO ABS: 1.2 10*3/uL — AB (ref 0.1–1.0)
MONOS PCT: 15 % — AB (ref 3–12)
Neutro Abs: 4.4 10*3/uL (ref 1.7–7.7)
Neutrophils Relative %: 54 % (ref 43–77)
Platelets: 297 10*3/uL (ref 150–400)
RBC: 4.77 MIL/uL (ref 4.22–5.81)
RDW: 19.1 % — ABNORMAL HIGH (ref 11.5–15.5)
WBC: 8.2 10*3/uL (ref 4.0–10.5)

## 2014-08-21 LAB — BASIC METABOLIC PANEL
BUN: 17 mg/dL (ref 6–23)
CHLORIDE: 110 meq/L (ref 96–112)
CO2: 25 mEq/L (ref 19–32)
Calcium: 8.8 mg/dL (ref 8.4–10.5)
Creat: 0.94 mg/dL (ref 0.50–1.35)
Glucose, Bld: 101 mg/dL — ABNORMAL HIGH (ref 70–99)
Potassium: 4.5 mEq/L (ref 3.5–5.3)
SODIUM: 144 meq/L (ref 135–145)

## 2014-08-21 LAB — LIPID PANEL
CHOL/HDL RATIO: 5 ratio
Cholesterol: 120 mg/dL (ref 0–200)
HDL: 24 mg/dL — ABNORMAL LOW (ref >39)
LDL Cholesterol: 80 mg/dL (ref 0–99)
Triglycerides: 81 mg/dL (ref <150)
VLDL: 16 mg/dL (ref 0–40)

## 2014-08-21 LAB — TSH: TSH: 3.244 u[IU]/mL (ref 0.350–4.500)

## 2014-08-21 LAB — BRAIN NATRIURETIC PEPTIDE: Brain Natriuretic Peptide: 3338 pg/mL — ABNORMAL HIGH (ref 0.0–100.0)

## 2014-08-22 ENCOUNTER — Encounter (HOSPITAL_COMMUNITY): Payer: No Typology Code available for payment source

## 2014-08-22 ENCOUNTER — Ambulatory Visit (HOSPITAL_BASED_OUTPATIENT_CLINIC_OR_DEPARTMENT_OTHER)
Admission: RE | Admit: 2014-08-22 | Discharge: 2014-08-22 | Disposition: A | Discharge status: Home / Self Care | Payer: No Typology Code available for payment source | Source: Ambulatory Visit | Attending: Cardiology | Admitting: Cardiology

## 2014-08-22 ENCOUNTER — Ambulatory Visit (HOSPITAL_COMMUNITY)
Admission: RE | Admit: 2014-08-22 | Discharge: 2014-08-22 | Disposition: A | Discharge status: Home / Self Care | Payer: No Typology Code available for payment source | Source: Ambulatory Visit | Attending: Cardiovascular Disease | Admitting: Cardiovascular Disease

## 2014-08-22 ENCOUNTER — Inpatient Hospital Stay (HOSPITAL_COMMUNITY): Admission: RE | Admit: 2014-08-22 | Payer: No Typology Code available for payment source | Source: Ambulatory Visit

## 2014-08-22 DIAGNOSIS — I420 Dilated cardiomyopathy: Secondary | ICD-10-CM

## 2014-08-22 DIAGNOSIS — I739 Peripheral vascular disease, unspecified: Secondary | ICD-10-CM

## 2014-08-22 DIAGNOSIS — M7989 Other specified soft tissue disorders: Secondary | ICD-10-CM

## 2014-08-22 DIAGNOSIS — I351 Nonrheumatic aortic (valve) insufficiency: Secondary | ICD-10-CM | POA: Diagnosis not present

## 2014-08-22 DIAGNOSIS — I5022 Chronic systolic (congestive) heart failure: Secondary | ICD-10-CM | POA: Diagnosis not present

## 2014-08-22 DIAGNOSIS — I10 Essential (primary) hypertension: Secondary | ICD-10-CM | POA: Insufficient documentation

## 2014-08-22 DIAGNOSIS — I369 Nonrheumatic tricuspid valve disorder, unspecified: Secondary | ICD-10-CM

## 2014-08-22 DIAGNOSIS — R609 Edema, unspecified: Secondary | ICD-10-CM | POA: Diagnosis present

## 2014-08-22 NOTE — Progress Notes (Signed)
Lower Extremity Arterial Duplex Completed. °Brianna L Mazza,RVT °

## 2014-08-22 NOTE — Progress Notes (Signed)
2D Echo Performed 08/22/2014    Marygrace Drought, RCS

## 2014-08-23 ENCOUNTER — Telehealth: Payer: Self-pay

## 2014-08-23 ENCOUNTER — Other Ambulatory Visit: Payer: Self-pay

## 2014-08-23 DIAGNOSIS — I351 Nonrheumatic aortic (valve) insufficiency: Secondary | ICD-10-CM

## 2014-08-23 DIAGNOSIS — I739 Peripheral vascular disease, unspecified: Secondary | ICD-10-CM

## 2014-08-23 DIAGNOSIS — I5023 Acute on chronic systolic (congestive) heart failure: Secondary | ICD-10-CM

## 2014-08-23 DIAGNOSIS — I420 Dilated cardiomyopathy: Secondary | ICD-10-CM

## 2014-08-23 NOTE — Telephone Encounter (Signed)
Patient called spoke to wife Dr.Jordan reviewed 08/22/14 lower ext dopplers.He agreed patient needs to see Dr.Dixon at [facility] .Schedulers will call back with [place] appointment.

## 2014-09-06 ENCOUNTER — Ambulatory Visit: Payer: Self-pay | Admitting: Physician Assistant

## 2014-09-12 ENCOUNTER — Encounter: Payer: Self-pay | Admitting: Cardiology

## 2014-09-12 ENCOUNTER — Encounter: Payer: Self-pay | Admitting: Vascular Surgery

## 2014-09-12 ENCOUNTER — Ambulatory Visit: Payer: No Typology Code available for payment source | Admitting: Cardiology

## 2014-09-12 DIAGNOSIS — I350 Nonrheumatic aortic (valve) stenosis: Secondary | ICD-10-CM | POA: Insufficient documentation

## 2014-09-12 DIAGNOSIS — I119 Hypertensive heart disease without heart failure: Secondary | ICD-10-CM | POA: Insufficient documentation

## 2014-09-12 DIAGNOSIS — E785 Hyperlipidemia, unspecified: Secondary | ICD-10-CM | POA: Insufficient documentation

## 2014-09-12 DIAGNOSIS — I272 Pulmonary hypertension, unspecified: Secondary | ICD-10-CM | POA: Insufficient documentation

## 2014-09-12 HISTORY — DX: Nonrheumatic aortic (valve) stenosis: I35.0

## 2014-09-13 ENCOUNTER — Ambulatory Visit: Payer: No Typology Code available for payment source | Admitting: Vascular Surgery

## 2014-10-08 ENCOUNTER — Telehealth: Payer: Self-pay

## 2014-10-08 DIAGNOSIS — M79605 Pain in left leg: Secondary | ICD-10-CM

## 2014-10-08 DIAGNOSIS — I739 Peripheral vascular disease, unspecified: Secondary | ICD-10-CM

## 2014-10-08 NOTE — Telephone Encounter (Signed)
Wife called to report pt. Is having pain in top of left foot, and the 3rd, 4th, and 5th toes.  Stated if he walks around, the pain eases up in the toes.  Stated pt. reports that the pain comes and goes, but wakes him up at night.  Stated pt. reports the left 3rd, 4th, and 5th toes get cool at times, and then warm-up again.  Denies any open sores or tissue loss.  Denies any loss of sensation.  Stated pt. c/o the left leg gets stiff with walking, but denies pain when walking.  Stated pt. missed 2 previous appts. because "he couldn't get there."   Advised will call back with appt. Information.  Encouraged to call office if symptoms worsen prior to the appt.  Verb. Understanding.  Discussed pt's symptoms and hx with Dr. Trula Slade.  Recommended to schedule ABI's and Left LE Art. Duplex. (had recent right LE Duplex @ [facility]).

## 2014-10-08 NOTE — Telephone Encounter (Signed)
Spoke with pts wife, she called back due to issues with her phone earlier, dpm

## 2014-10-08 NOTE — Telephone Encounter (Signed)
Unable to reach pt by phone. Home # rings, but does not have VM option. Cell phone # does not ring- disconnects immediatly after dialing. Will try to call again tomorrow to inform of appt on 12/30. dpm

## 2014-10-23 ENCOUNTER — Encounter: Payer: Self-pay | Admitting: Vascular Surgery

## 2014-10-24 ENCOUNTER — Other Ambulatory Visit: Payer: Self-pay

## 2014-10-24 ENCOUNTER — Ambulatory Visit (INDEPENDENT_AMBULATORY_CARE_PROVIDER_SITE_OTHER): Payer: Self-pay | Admitting: Vascular Surgery

## 2014-10-24 ENCOUNTER — Encounter: Payer: Self-pay | Admitting: Vascular Surgery

## 2014-10-24 ENCOUNTER — Ambulatory Visit (HOSPITAL_COMMUNITY)
Admission: RE | Admit: 2014-10-24 | Discharge: 2014-10-24 | Disposition: A | Discharge status: Home / Self Care | Payer: No Typology Code available for payment source | Source: Ambulatory Visit | Attending: Vascular Surgery | Admitting: Vascular Surgery

## 2014-10-24 VITALS — BP 153/69 | HR 91 | Resp 14 | Ht 71.0 in | Wt 150.0 lb

## 2014-10-24 DIAGNOSIS — Z87891 Personal history of nicotine dependence: Secondary | ICD-10-CM | POA: Insufficient documentation

## 2014-10-24 DIAGNOSIS — I739 Peripheral vascular disease, unspecified: Secondary | ICD-10-CM

## 2014-10-24 DIAGNOSIS — M79605 Pain in left leg: Secondary | ICD-10-CM

## 2014-10-24 DIAGNOSIS — E785 Hyperlipidemia, unspecified: Secondary | ICD-10-CM | POA: Diagnosis not present

## 2014-10-24 DIAGNOSIS — I10 Essential (primary) hypertension: Secondary | ICD-10-CM | POA: Insufficient documentation

## 2014-10-24 NOTE — Progress Notes (Signed)
[facility] [place]  Patient name: Kyle [name] MRN: [id number] DOB: 10-22-1951 Sex: male  REASON FOR VISIT: Peripheral vascular disease  HPI: Kyle [name] is a 63 y.o. male who I performed a right femoropopliteal bypass graft on in 2010.Marland Kitchen He was seen by Dr. Peter Martinique on 08/21/2014. He had acute systolic congestive heart failureand this was managed by Dr. Martinique. His blood pressure was not well controlled at that visit. He's had a previous right femoropopliteal bypass graft and he had Doppler studies done for follow up. This showed that he had good perfusion on the right side but on the left side he had markedly decreased perfusion and was sent for vascular consultation.  The patient complains of rest pain in the left foot which it and going on for several months. He also specifically describes pain in his left third fourth and fifth toes. He denies any history of nonhealing ulcers. He denies claudication symptoms in the right lower extremity. He is not a smoker.    Past Medical History  Diagnosis Date  . Aortic insufficiency     MODERATE WITH A BICUSPID AORTIC VAVLE  . Hypertension   . Dilated cardiomyopathy     WITH EJECTION FRACTION DOWN TO 20-25%--WITH CONGESTIVE HEART FAILURE  . CHF (congestive heart failure)   . SOB (shortness of breath)   . Orthopnea   . Edema     LOWER EXTREMETIES  . COPD (chronic obstructive pulmonary disease)   . Arterial occlusive disease     MULTILEVEL  . Peripheral arterial disease   . Pulmonary hypertension   . Normal coronary arteries 2009   Family History  Problem Relation Age of Onset  . Diabetes Mother   . Hyperlipidemia Sister   . Hypertension Sister    SOCIAL HISTORY: History  Substance Use Topics  . Smoking status: Former Smoker -- 1.00 packs/day  . Smokeless tobacco: Never Used     Comment: quit 2010  . Alcohol Use: 0.0 oz/week    0 Not specified per week   No Known Allergies Current Outpatient  Prescriptions  Medication Sig Dispense Refill  . aspirin 81 MG chewable tablet Chew 81 mg by mouth daily.      . carvedilol (COREG) 12.5 MG tablet Take 1 tablet (12.5 mg total) by mouth 2 (two) times daily with a meal. 60 tablet 11  . furosemide (LASIX) 40 MG tablet Take 1 tablet (40 mg total) by mouth daily. 30 tablet 11  . quinapril (ACCUPRIL) 10 MG tablet Take 1 tablet (10 mg total) by mouth daily. 30 tablet 11  . amLODipine (NORVASC) 5 MG tablet Take 1 tablet (5 mg total) by mouth daily. (Patient not taking: Reported on 10/24/2014) 30 tablet 3  . pravastatin (PRAVACHOL) 40 MG tablet Take 2 tablets every night (Patient not taking: Reported on 10/24/2014) 60 tablet 3   No current facility-administered medications for this visit.   REVIEW OF SYSTEMS: Valu.Nieves ] denotes positive finding; [  ] denotes negative finding  CARDIOVASCULAR:  [ ]  chest pain   [ ]  chest pressure   [ ]  palpitations   [ ]  orthopnea   [ ]  dyspnea on exertion   Valu.Nieves ] claudication   Valu.Nieves ] rest pain   [ ]  DVT   [ ]  phlebitis PULMONARY:   Valu.Nieves ] productive cough   [ ]  asthma   [ ]  wheezing NEUROLOGIC:   [ ]  weakness  [ ]  paresthesias  [ ]  aphasia  [ ]   amaurosis  [ ]  dizziness HEMATOLOGIC:   [ ]  bleeding problems   [ ]  clotting disorders MUSCULOSKELETAL:  [ ]  joint pain   [ ]  joint swelling [ ]  leg swelling GASTROINTESTINAL: [ ]   blood in stool  [ ]   hematemesis GENITOURINARY:  [ ]   dysuria  [ ]   hematuria PSYCHIATRIC:  [ ]  history of major depression INTEGUMENTARY:  [ ]  rashes  [ ]  ulcers CONSTITUTIONAL:  [ ]  fever   [ ]  chills  PHYSICAL EXAM: Filed Vitals:   10/24/14 0938  BP: 153/69  Pulse: 91  Resp: 14  Height: 5\' 11"  (1.803 m)  Weight: 150 lb (68.04 kg)   Body mass index is 20.93 kg/(m^2). GENERAL: The patient is a well-nourished male, in no acute distress. The vital signs are documented above. CARDIOVASCULAR: There is a regular rate and rhythm. I do not detect carotid bruits. He has a palpable right femoral pulse with  a warm well-perfused right foot. On the left side I cannot palpate a femoral pulse or pedal pulses. He has no significant lower extremity swelling. PULMONARY: There is good air exchange bilaterally without wheezing or rales. ABDOMEN: Soft and non-tender with normal pitched bowel sounds.  MUSCULOSKELETAL: There are no major deformities or cyanosis. NEUROLOGIC: No focal weakness or paresthesias are detected. SKIN: There are no ulcers or rashes noted. PSYCHIATRIC: The patient has a normal affect.  DATA:  I have reviewed his duplex scan dated 08/22/2014 that was done at the referring vascular lab. He had a right external iliac artery stenosis by duplex. He had a patent right femoropopliteal bypass graft with a known right superficial femoral artery occlusion. The left superficial femoral artery was occluded. There were no patent tibial vessels on the left by duplex.  I have independently interpreted the arterial Doppler study in our office today which shows monophasic Doppler signals in the left dorsalis pedis and posterior tibial positions with an ABI of 34%. Toe pressure on the left was 0. On the right side where he has a functioning right femoropopliteal bypass graft, he has a biphasic dorsalis pedis signal with a monophasic posterior tibial signal. ABI is 76%. Toe pressure on the right is 54 mmHg.  MEDICAL ISSUES:  PERIPHERAL VASCULAR DISEASE WITH REST PAIN: The patient has rest pain in the left foot with evidence of severe multilevel arterial occlusive disease on the left. I have recommended that we proceed with arteriography.I have reviewed with the patient the indications for arteriography. In addition, I have reviewed the potential complications of arteriography including but not limited to: Bleeding, arterial injury, arterial thrombosis, dye action, renal insufficiency, or other unpredictable medical problems. I have explained to the patient that if we find disease amenable to angioplasty we  could potentially address this at the same time. I have discussed the potential complications of angioplasty and stenting, including but not limited to: Bleeding, arterial thrombosis, arterial injury, dissection, or the need for surgical intervention. I will try to cannulate the left side to avoid risk injuring his right femoropopliteal graft, however given that there is no femoral pulse in the left and may have to stick the right above the graft. It is unlikely that he is a candidate for an endovascular approach to his multilevel disease, however, I noted that if he did have iliac disease which was amenable to angioplasty this could potentially be addressed at the same time. His procedure is scheduled for 10/29/2014.  Of note, if he does require bypass he would need to  be evaluated preoperatively by Dr. Martinique given his history of aortic insufficiency and dilated cardiomyopathy with an ejection fraction of 20-25%.   [facility] [place] [facility]: [phone]

## 2014-10-29 ENCOUNTER — Ambulatory Visit (HOSPITAL_COMMUNITY)
Admission: RE | Admit: 2014-10-29 | Payer: No Typology Code available for payment source | Source: Ambulatory Visit | Admitting: Vascular Surgery

## 2014-10-29 ENCOUNTER — Encounter (HOSPITAL_COMMUNITY): Admission: RE | Payer: Self-pay | Source: Ambulatory Visit

## 2014-10-29 ENCOUNTER — Other Ambulatory Visit: Payer: Self-pay

## 2014-10-29 SURGERY — ABDOMINAL AORTAGRAM
Anesthesia: LOCAL

## 2014-11-05 ENCOUNTER — Other Ambulatory Visit: Payer: Self-pay

## 2014-11-05 ENCOUNTER — Telehealth: Payer: Self-pay

## 2014-11-05 ENCOUNTER — Encounter (HOSPITAL_COMMUNITY)
Admission: RE | Disposition: A | Discharge status: Home / Self Care | Payer: Self-pay | Source: Ambulatory Visit | Attending: Vascular Surgery

## 2014-11-05 ENCOUNTER — Ambulatory Visit (HOSPITAL_COMMUNITY)
Admission: RE | Admit: 2014-11-05 | Discharge: 2014-11-05 | Disposition: A | Discharge status: Home / Self Care | Payer: No Typology Code available for payment source | Source: Ambulatory Visit | Attending: Vascular Surgery | Admitting: Vascular Surgery

## 2014-11-05 ENCOUNTER — Encounter (HOSPITAL_COMMUNITY): Payer: Self-pay | Admitting: Vascular Surgery

## 2014-11-05 DIAGNOSIS — I70202 Unspecified atherosclerosis of native arteries of extremities, left leg: Secondary | ICD-10-CM | POA: Diagnosis not present

## 2014-11-05 DIAGNOSIS — I739 Peripheral vascular disease, unspecified: Secondary | ICD-10-CM | POA: Diagnosis present

## 2014-11-05 DIAGNOSIS — I420 Dilated cardiomyopathy: Secondary | ICD-10-CM | POA: Insufficient documentation

## 2014-11-05 DIAGNOSIS — I272 Other secondary pulmonary hypertension: Secondary | ICD-10-CM | POA: Insufficient documentation

## 2014-11-05 DIAGNOSIS — Z7982 Long term (current) use of aspirin: Secondary | ICD-10-CM | POA: Diagnosis not present

## 2014-11-05 DIAGNOSIS — I509 Heart failure, unspecified: Secondary | ICD-10-CM | POA: Diagnosis not present

## 2014-11-05 DIAGNOSIS — Z87891 Personal history of nicotine dependence: Secondary | ICD-10-CM | POA: Insufficient documentation

## 2014-11-05 DIAGNOSIS — I351 Nonrheumatic aortic (valve) insufficiency: Secondary | ICD-10-CM | POA: Diagnosis not present

## 2014-11-05 DIAGNOSIS — I70422 Atherosclerosis of autologous vein bypass graft(s) of the extremities with rest pain, left leg: Secondary | ICD-10-CM

## 2014-11-05 DIAGNOSIS — J449 Chronic obstructive pulmonary disease, unspecified: Secondary | ICD-10-CM | POA: Diagnosis not present

## 2014-11-05 HISTORY — PX: ABDOMINAL AORTAGRAM: SHX5454

## 2014-11-05 HISTORY — PX: PERIPHERAL VASCULAR CATHETERIZATION: SHX172C

## 2014-11-05 LAB — POCT I-STAT, CHEM 8
BUN: 15 mg/dL (ref 6–23)
Calcium, Ion: 1.15 mmol/L (ref 1.13–1.30)
Chloride: 104 mEq/L (ref 96–112)
Creatinine, Ser: 1 mg/dL (ref 0.50–1.35)
GLUCOSE: 130 mg/dL — AB (ref 70–99)
HCT: 54 % — ABNORMAL HIGH (ref 39.0–52.0)
Hemoglobin: 18.4 g/dL — ABNORMAL HIGH (ref 13.0–17.0)
POTASSIUM: 3.9 mmol/L (ref 3.5–5.1)
SODIUM: 143 mmol/L (ref 135–145)
TCO2: 25 mmol/L (ref 0–100)

## 2014-11-05 SURGERY — ABDOMINAL AORTAGRAM
Anesthesia: LOCAL

## 2014-11-05 MED ORDER — HYDRALAZINE HCL 20 MG/ML IJ SOLN: 10.0000 mg | Freq: Four times a day (QID) | INTRAMUSCULAR | Status: DC | PRN

## 2014-11-05 MED ORDER — SODIUM CHLORIDE 0.9 % IV SOLN: 1.0000 mL/kg/h | INTRAVENOUS | Status: DC

## 2014-11-05 MED ORDER — LIDOCAINE HCL (PF) 1 % IJ SOLN
INTRAMUSCULAR | Status: AC
  Filled 2014-11-05: qty 30

## 2014-11-05 MED ORDER — HYDRALAZINE HCL 20 MG/ML IJ SOLN
INTRAMUSCULAR | Status: AC
  Filled 2014-11-05: qty 1

## 2014-11-05 MED ORDER — MIDAZOLAM HCL 2 MG/2ML IJ SOLN
INTRAMUSCULAR | Status: AC
  Filled 2014-11-05: qty 2

## 2014-11-05 MED ORDER — SODIUM CHLORIDE 0.9 % IV SOLN: INTRAVENOUS | Status: DC

## 2014-11-05 MED ORDER — FENTANYL CITRATE 0.05 MG/ML IJ SOLN
INTRAMUSCULAR | Status: AC
  Filled 2014-11-05: qty 2

## 2014-11-05 MED ORDER — HEPARIN (PORCINE) IN NACL 2-0.9 UNIT/ML-% IJ SOLN
INTRAMUSCULAR | Status: AC
  Filled 2014-11-05: qty 1000

## 2014-11-05 NOTE — Progress Notes (Signed)
Site area: rt groin Site Prior to Removal:  Level Geographical information systems officer For: 20 min Manual:   yes Patient Status During Pull:  stable Post Pull Site:  Level 0 Post Pull Instructions Given:  yes Post Pull Pulses Present: dopplerDressing Applied:  tegaderm applied Bedrest begins @ 1055am Comments:no complications  Rt groin

## 2014-11-05 NOTE — Op Note (Signed)
   PATIENT: Kyle [name]   MRN: [id number] DOB: 1951/10/06    DATE OF PROCEDURE: 11/05/2014  INDICATIONS: CRISTINO BEDORE is a 64 y.o. male who presents with progressive ischemic symptoms of his left lower extremity. He presents for arteriography.  PROCEDURE:  1. Ultrasound-guided access to the right common femoral artery 2. Aortogram with bilateral iliac arteriogram and bilateral lower extremity runoff 3. Retrograde right femoral arteriogram  SURGEON: [name]. Scot Dock, MD, FACS  ANESTHESIA: local with sedation   EBL: minimal  TECHNIQUE: The patient was taken to the peripheral vascular lab and received 1 mg of Versed and 50 g of fentanyl. Both groins were prepped and draped in usual sterile fashion. The left common femoral artery did not appear to be patent by duplex. I therefore elected to try to cannulate above his bypass graft on the right. Under all sound guidance, after the skin was anesthetized, the right common femoral artery was cannulated above the level of the bypass graft with a micropuncture needle. A micropuncture sheath was introduced over a wire. This was then exchanged for a 5 French sheath over a versa core wire. The pigtail catheter was positioned at the L1 vertebral body and flush aortogram obtained. The catheter was then advanced in a lateral projection was obtained. The catheter was in position above the aortic bifurcation and oblique iliac projection was obtained. Next bilateral lower from the runoff films were obtained. Next the pigtail catheter was removed over a wire and a retrograde right iliac arteriogram obtained with right lower extremity runoff.  FINDINGS:  1. There are single renal arteries bilaterally with no significant renal artery stenosis identified. There is diffuse disease of the distal infrarenal aorta. There is disease of the left common iliac artery. The external iliac artery on the left is occluded as is the common femoral artery. There is  reconstitution of the deep femoral artery on the left. The superficial femoral arteries occluded at its origin and there is reconstitution of the above-knee pop to artery and the left. There is single-vessel runoff on the left via the posterior tibial artery. The anterior tibial arteries are occluded. 2. On the right side the common iliac and external iliac artery are patent. There is severe diffuse disease of the external iliac artery on the left. The common femoral and deep femoral artery are patent. The superficial femoral artery is occluded at its origin. The bypass graft on the right is occluded. There is significant collaterals in the thigh but no patent vein and vessels in the distal thigh or leg on the right.  CLINICAL NOTE: The patient will need to be considered for either aortobifemoral bypass grafting or axillobifemoral bypass grafting. He has significant cardiomyopathy with ejection fraction of 20-25%. I'll have him evaluated preoperatively by Dr. Peter Martinique. The patient has extensive multilevel disease bilaterally.  [name], MD, FACS [facility]  DATE OF DICTATION:   11/05/2014

## 2014-11-05 NOTE — Interval H&P Note (Signed)
History and Physical Interval Note:  11/05/2014 9:13 AM  Kyle [name]  has presented today for surgery, with the diagnosis of pvd  The various methods of treatment have been discussed with the patient and family. After consideration of risks, benefits and other options for treatment, the patient has consented to  Procedure(s): ABDOMINAL AORTAGRAM (N/A) as a surgical intervention .  The patient's history has been reviewed, patient examined, no change in status, stable for surgery.  I have reviewed the patient's chart and labs.  Questions were answered to the patient's satisfaction.     DICKSON,CHRISTOPHER S

## 2014-11-05 NOTE — Telephone Encounter (Signed)
Patient called spoke to wife appointment scheduled with Dr.Jordan 11/09/14 11:45 am.Advised to keep echo appointment 11/06/13 at 2:00 pm.Stated they are having financial problems and wanted to know if echo can be done same day as appointment with Dr.Jordan.Advised to call back tomorrow and speak to our scheduler.Advised to let them know of your situation.

## 2014-11-05 NOTE — Discharge Instructions (Signed)

## 2014-11-05 NOTE — H&P (View-Only) (Signed)
[facility] [place]  Patient name: Kyle [name] MRN: [id number] DOB: 08-03-1951 Sex: male  REASON FOR VISIT: Peripheral vascular disease  HPI: Kyle [name] is a 64 y.o. male who I performed a right femoropopliteal bypass graft on in 2010.Marland Kitchen He was seen by Dr. Peter Martinique on 08/21/2014. He had acute systolic congestive heart failureand this was managed by Dr. Martinique. His blood pressure was not well controlled at that visit. He's had a previous right femoropopliteal bypass graft and he had Doppler studies done for follow up. This showed that he had good perfusion on the right side but on the left side he had markedly decreased perfusion and was sent for vascular consultation.  The patient complains of rest pain in the left foot which it and going on for several months. He also specifically describes pain in his left third fourth and fifth toes. He denies any history of nonhealing ulcers. He denies claudication symptoms in the right lower extremity. He is not a smoker.    Past Medical History  Diagnosis Date  . Aortic insufficiency     MODERATE WITH A BICUSPID AORTIC VAVLE  . Hypertension   . Dilated cardiomyopathy     WITH EJECTION FRACTION DOWN TO 20-25%--WITH CONGESTIVE HEART FAILURE  . CHF (congestive heart failure)   . SOB (shortness of breath)   . Orthopnea   . Edema     LOWER EXTREMETIES  . COPD (chronic obstructive pulmonary disease)   . Arterial occlusive disease     MULTILEVEL  . Peripheral arterial disease   . Pulmonary hypertension   . Normal coronary arteries 2009   Family History  Problem Relation Age of Onset  . Diabetes Mother   . Hyperlipidemia Sister   . Hypertension Sister    SOCIAL HISTORY: History  Substance Use Topics  . Smoking status: Former Smoker -- 1.00 packs/day  . Smokeless tobacco: Never Used     Comment: quit 2010  . Alcohol Use: 0.0 oz/week    0 Not specified per week   No Known Allergies Current Outpatient  Prescriptions  Medication Sig Dispense Refill  . aspirin 81 MG chewable tablet Chew 81 mg by mouth daily.      . carvedilol (COREG) 12.5 MG tablet Take 1 tablet (12.5 mg total) by mouth 2 (two) times daily with a meal. 60 tablet 11  . furosemide (LASIX) 40 MG tablet Take 1 tablet (40 mg total) by mouth daily. 30 tablet 11  . quinapril (ACCUPRIL) 10 MG tablet Take 1 tablet (10 mg total) by mouth daily. 30 tablet 11  . amLODipine (NORVASC) 5 MG tablet Take 1 tablet (5 mg total) by mouth daily. (Patient not taking: Reported on 10/24/2014) 30 tablet 3  . pravastatin (PRAVACHOL) 40 MG tablet Take 2 tablets every night (Patient not taking: Reported on 10/24/2014) 60 tablet 3   No current facility-administered medications for this visit.   REVIEW OF SYSTEMS: Valu.Nieves ] denotes positive finding; [  ] denotes negative finding  CARDIOVASCULAR:  [ ]  chest pain   [ ]  chest pressure   [ ]  palpitations   [ ]  orthopnea   [ ]  dyspnea on exertion   Valu.Nieves ] claudication   Valu.Nieves ] rest pain   [ ]  DVT   [ ]  phlebitis PULMONARY:   Valu.Nieves ] productive cough   [ ]  asthma   [ ]  wheezing NEUROLOGIC:   [ ]  weakness  [ ]  paresthesias  [ ]  aphasia  [ ]   amaurosis  [ ]  dizziness HEMATOLOGIC:   [ ]  bleeding problems   [ ]  clotting disorders MUSCULOSKELETAL:  [ ]  joint pain   [ ]  joint swelling [ ]  leg swelling GASTROINTESTINAL: [ ]   blood in stool  [ ]   hematemesis GENITOURINARY:  [ ]   dysuria  [ ]   hematuria PSYCHIATRIC:  [ ]  history of major depression INTEGUMENTARY:  [ ]  rashes  [ ]  ulcers CONSTITUTIONAL:  [ ]  fever   [ ]  chills  PHYSICAL EXAM: Filed Vitals:   10/24/14 0938  BP: 153/69  Pulse: 91  Resp: 14  Height: 5\' 11"  (1.803 m)  Weight: 150 lb (68.04 kg)   Body mass index is 20.93 kg/(m^2). GENERAL: The patient is a well-nourished male, in no acute distress. The vital signs are documented above. CARDIOVASCULAR: There is a regular rate and rhythm. I do not detect carotid bruits. He has a palpable right femoral pulse with  a warm well-perfused right foot. On the left side I cannot palpate a femoral pulse or pedal pulses. He has no significant lower extremity swelling. PULMONARY: There is good air exchange bilaterally without wheezing or rales. ABDOMEN: Soft and non-tender with normal pitched bowel sounds.  MUSCULOSKELETAL: There are no major deformities or cyanosis. NEUROLOGIC: No focal weakness or paresthesias are detected. SKIN: There are no ulcers or rashes noted. PSYCHIATRIC: The patient has a normal affect.  DATA:  I have reviewed his duplex scan dated 08/22/2014 that was done at the referring vascular lab. He had a right external iliac artery stenosis by duplex. He had a patent right femoropopliteal bypass graft with a known right superficial femoral artery occlusion. The left superficial femoral artery was occluded. There were no patent tibial vessels on the left by duplex.  I have independently interpreted the arterial Doppler study in our office today which shows monophasic Doppler signals in the left dorsalis pedis and posterior tibial positions with an ABI of 34%. Toe pressure on the left was 0. On the right side where he has a functioning right femoropopliteal bypass graft, he has a biphasic dorsalis pedis signal with a monophasic posterior tibial signal. ABI is 76%. Toe pressure on the right is 54 mmHg.  MEDICAL ISSUES:  PERIPHERAL VASCULAR DISEASE WITH REST PAIN: The patient has rest pain in the left foot with evidence of severe multilevel arterial occlusive disease on the left. I have recommended that we proceed with arteriography.I have reviewed with the patient the indications for arteriography. In addition, I have reviewed the potential complications of arteriography including but not limited to: Bleeding, arterial injury, arterial thrombosis, dye action, renal insufficiency, or other unpredictable medical problems. I have explained to the patient that if we find disease amenable to angioplasty we  could potentially address this at the same time. I have discussed the potential complications of angioplasty and stenting, including but not limited to: Bleeding, arterial thrombosis, arterial injury, dissection, or the need for surgical intervention. I will try to cannulate the left side to avoid risk injuring his right femoropopliteal graft, however given that there is no femoral pulse in the left and may have to stick the right above the graft. It is unlikely that he is a candidate for an endovascular approach to his multilevel disease, however, I noted that if he did have iliac disease which was amenable to angioplasty this could potentially be addressed at the same time. His procedure is scheduled for 10/29/2014.  Of note, if he does require bypass he would need to  be evaluated preoperatively by Dr. Martinique given his history of aortic insufficiency and dilated cardiomyopathy with an ejection fraction of 20-25%.   [facility]: [phone]

## 2014-11-06 ENCOUNTER — Ambulatory Visit (HOSPITAL_COMMUNITY)
Admit: 2014-11-06 | Discharge: 2014-11-06 | Disposition: A | Discharge status: Home / Self Care | Payer: No Typology Code available for payment source | Source: Ambulatory Visit | Attending: Cardiovascular Disease | Admitting: Cardiovascular Disease

## 2014-11-06 ENCOUNTER — Other Ambulatory Visit: Payer: Self-pay | Admitting: *Deleted

## 2014-11-06 DIAGNOSIS — Z8249 Family history of ischemic heart disease and other diseases of the circulatory system: Secondary | ICD-10-CM | POA: Diagnosis not present

## 2014-11-06 DIAGNOSIS — Z87891 Personal history of nicotine dependence: Secondary | ICD-10-CM | POA: Insufficient documentation

## 2014-11-06 DIAGNOSIS — I420 Dilated cardiomyopathy: Secondary | ICD-10-CM

## 2014-11-06 DIAGNOSIS — Z01810 Encounter for preprocedural cardiovascular examination: Secondary | ICD-10-CM

## 2014-11-06 DIAGNOSIS — I5023 Acute on chronic systolic (congestive) heart failure: Secondary | ICD-10-CM

## 2014-11-06 DIAGNOSIS — I351 Nonrheumatic aortic (valve) insufficiency: Secondary | ICD-10-CM

## 2014-11-06 DIAGNOSIS — I350 Nonrheumatic aortic (valve) stenosis: Secondary | ICD-10-CM

## 2014-11-06 DIAGNOSIS — I10 Essential (primary) hypertension: Secondary | ICD-10-CM | POA: Diagnosis not present

## 2014-11-06 NOTE — Progress Notes (Signed)
2D Echocardiogram Complete.  11/06/2014   Pride Gonzales, RDCS  

## 2014-11-07 ENCOUNTER — Telehealth: Payer: Self-pay | Admitting: Vascular Surgery

## 2014-11-07 NOTE — Telephone Encounter (Signed)
Unable to reach patient at either phone # in [facility]- mailed letter, dpm

## 2014-11-07 NOTE — Telephone Encounter (Signed)
-----   Message from Mena Goes, RN sent at 11/06/2014  9:46 AM EST ----- Regarding: Schedule Schedule this in the next couple of weeks ----- Message -----    From: Angelia Mould, MD    Sent: 11/05/2014  10:28 AM      To: Vvs Charge Pool Subject: charge                                         PROCEDURE:  1. Ultrasound-guided access to the right common femoral artery 2. Aortogram with bilateral iliac arteriogram and bilateral lower extremity runoff 3. Retrograde right femoral arteriogram  SURGEON: [name]. Scot Dock, MD, FACS  I will need to see him to discuss his options after he undergoes preoperative cardiac clearance by Dr. Peter Martinique. Thanks.CD

## 2014-11-09 ENCOUNTER — Ambulatory Visit (INDEPENDENT_AMBULATORY_CARE_PROVIDER_SITE_OTHER): Payer: No Typology Code available for payment source | Admitting: Cardiology

## 2014-11-09 ENCOUNTER — Encounter: Payer: Self-pay | Admitting: Cardiology

## 2014-11-09 VITALS — BP 150/90 | HR 66 | Ht 71.0 in | Wt 147.9 lb

## 2014-11-09 DIAGNOSIS — I5023 Acute on chronic systolic (congestive) heart failure: Secondary | ICD-10-CM

## 2014-11-09 DIAGNOSIS — I350 Nonrheumatic aortic (valve) stenosis: Secondary | ICD-10-CM

## 2014-11-09 DIAGNOSIS — I70219 Atherosclerosis of native arteries of extremities with intermittent claudication, unspecified extremity: Secondary | ICD-10-CM

## 2014-11-09 DIAGNOSIS — I351 Nonrheumatic aortic (valve) insufficiency: Secondary | ICD-10-CM

## 2014-11-09 DIAGNOSIS — I420 Dilated cardiomyopathy: Secondary | ICD-10-CM

## 2014-11-09 LAB — BASIC METABOLIC PANEL
BUN: 15 mg/dL (ref 6–23)
CALCIUM: 9.1 mg/dL (ref 8.4–10.5)
CHLORIDE: 101 meq/L (ref 96–112)
CO2: 22 meq/L (ref 19–32)
Creat: 0.92 mg/dL (ref 0.50–1.35)
Glucose, Bld: 108 mg/dL — ABNORMAL HIGH (ref 70–99)
POTASSIUM: 6.5 meq/L — AB (ref 3.5–5.3)
Sodium: 136 mEq/L (ref 135–145)

## 2014-11-09 MED ORDER — QUINAPRIL HCL 20 MG PO TABS: 20.0000 mg | ORAL_TABLET | Freq: Every day | ORAL | Status: DC

## 2014-11-09 MED ORDER — PRAVASTATIN SODIUM 40 MG PO TABS: ORAL_TABLET | ORAL | Status: DC

## 2014-11-09 NOTE — Progress Notes (Signed)
Kyle [name] Date of Birth: Nov 06, 1950 Medical Record [id number]  History of Present Illness: Mr. Yetter is seen for follow up CHF.  He has a history of systolic CHF with EF of 0000000 in May 2012. Coronary anatomy was normal by cath. He has a bicuspid AV with moderate AS/AI. He had moderate pulmonary HTN. He was treated medically and one of my old notes stated that his LV function showed marked improvement with  Echo in 2011 showing improvement in EF to 55-60%. I have been unable to find this Echo study to review. Unfortunately , he was lost to follow up and was not taking medication for over 2 years. He also has a history of HTN, hyperlipidemia, and PAD s/p right fem-pop BPG.  When seen in October he had evidence of recurrent CHF with severe HTN. Echo showed an EF of 20-25% with moderate AS/AI. He was started back on medication with lasix, carvedilol, and quinapril. He did not come back for follow up as instructed but states he has been taking his medication. He states his insurance ran out. He has lost 21 lbs. His edema resolved. His breathing is doing much better and is now class 2. His cough has resolved. He was seen by [name] for claudication and was found to have severe PAD. Revascularization recommended with either aortobifemoral BPG or axillo-fem BPG. Patient returns today to assess surgical risk.     Medication List       This list is accurate as of: 11/09/14  1:38 PM.  Always use your most recent med list.               amLODipine 5 MG tablet  Commonly known as:  NORVASC  Take 1 tablet (5 mg total) by mouth daily.     aspirin 81 MG chewable tablet  Chew 81 mg by mouth daily.     carvedilol 12.5 MG tablet  Commonly known as:  COREG  Take 1 tablet (12.5 mg total) by mouth 2 (two) times daily with a meal.     furosemide 40 MG tablet  Commonly known as:  LASIX  Take 1 tablet (40 mg total) by mouth daily.     pravastatin 40 MG tablet  Commonly known as:  PRAVACHOL    Take 1 tablet every night     quinapril 20 MG tablet  Commonly known as:  ACCUPRIL  Take 1 tablet (20 mg total) by mouth at bedtime.         No Known Allergies  Past Medical History  Diagnosis Date  . Aortic insufficiency     MODERATE WITH A BICUSPID AORTIC VAVLE  . Hypertension   . Dilated cardiomyopathy     WITH EJECTION FRACTION DOWN TO 20-25%--WITH CONGESTIVE HEART FAILURE  . CHF (congestive heart failure)   . SOB (shortness of breath)   . Orthopnea   . Edema     LOWER EXTREMETIES  . COPD (chronic obstructive pulmonary disease)   . Arterial occlusive disease     MULTILEVEL  . Peripheral arterial disease   . Pulmonary hypertension   . Normal coronary arteries 2009    Past Surgical History  Procedure Laterality Date  . Other surgical history      POST RIGHT FEMOROPOPLITEAL BYPASS GRAFT  . Colonoscopy    . Cardiac catheterization  2009    Nl Cors, EF 20%  . Femoral-popliteal bypass graft  02/21/2011    right  . Abdominal aortagram N/A 11/05/2014  Procedure: ABDOMINAL AORTAGRAM;  Surgeon: Angelia Mould, MD;  Location: [facility] CATH LAB;  Service: Cardiovascular;  Laterality: N/A;  . Peripheral vascular catheterization  11/05/2014    Procedure: LOWER EXTREMITY ANGIOGRAPHY;  Surgeon: Angelia Mould, MD;  Location: [facility] CATH LAB;  Service: Cardiovascular;;    History   Social History  . Marital Status: Married    Spouse Name: N/A    Number of Children: 1  . Years of Education: N/A   Occupational History  .  [employer]   Social History Main Topics  . Smoking status: Former Smoker -- 1.00 packs/day  . Smokeless tobacco: Never Used     Comment: quit 2010  . Alcohol Use: 0.0 oz/week    0 Not specified per week  . Drug Use: No  . Sexual Activity: None   Other Topics Concern  . None   Social History Narrative    Family History  Problem Relation Age of Onset  . Diabetes Mother   . Hyperlipidemia Sister   . Hypertension Sister     Review  of Systems: As noted in HPI. He states his feet and toes go completely cold at night and he has to get up and move around for relief.  All other systems were reviewed and are negative.  Physical Exam: BP 150/90 mmHg  Pulse 66  Ht 5\' 11"  (1.803 m)  Wt 147 lb 14.4 oz (67.087 kg)  BMI 20.64 kg/m2 Filed Weights   11/09/14 1151  Weight: 147 lb 14.4 oz (67.087 kg)  GENERAL:  Well appearing, thin BM in NAD HEENT:  PERRL, EOMI, sclera are clear. Oropharynx is clear. NECK:  No JVD, carotid upstroke brisk and symmetric, no bruits, no thyromegaly or adenopathy LUNGS:  Clear CHEST:  Unremarkable HEART:  RRR,  PMI displaced laterally,S1 and S2 within normal limits, positive S4. No murmur. ABD:  Soft, nontender. BS +, no masses or bruits. No hepatomegaly, no splenomegaly EXT:  Absent pedal pulses, no edema. SKIN:  Warm and dry.  No rashes NEURO:  Alert and oriented x 3. Cranial nerves II through XII intact. PSYCH:  Cognitively intact    LABORATORY DATA: . Lab Results  Component Value Date   WBC 8.2 08/20/2014   HGB 18.4* 11/05/2014   HCT 54.0* 11/05/2014   PLT 297 08/20/2014   GLUCOSE 130* 11/05/2014   CHOL 120 08/20/2014   TRIG 81 08/20/2014   HDL 24* 08/20/2014   LDLCALC 80 08/20/2014   ALT 38 08/20/2014   AST 20 08/20/2014   NA 143 11/05/2014   K 3.9 11/05/2014   CL 104 11/05/2014   CREATININE 1.00 11/05/2014   BUN 15 11/05/2014   CO2 25 08/20/2014   TSH 3.244 08/20/2014   INR 1.1 02/18/2009   HGBA1C * 10/17/2008    6.7 (NOTE)   The ADA recommends the following therapeutic goal for glycemic   control related to Hgb A1C measurement:   Goal of Therapy:   < 7.0% Hgb A1C   Reference: American Diabetes Association: Clinical Practice   Recommendations 2008, Diabetes Care,  2008, 31:(Suppl 1).    Echo: 11/06/14:Study Conclusions  - Left ventricle: The cavity size was normal. Wall thickness was increased in a pattern of severe LVH. Systolic function was severely reduced.  The estimated ejection fraction was in the range of 20% to 25%. Diffuse hypokinesis. Doppler parameters are consistent with abnormal left ventricular relaxation (grade 1 diastolic dysfunction). - Aortic valve: Valve mobility was restricted. There was moderate stenosis. There  was moderate regurgitation. Valve area (VTI): 1.36 cm^2. Valve area (Vmax): 1.45 cm^2. Valve area (Vmean): 1.25 cm^2. - Left atrium: The atrium was mildly dilated. - Right ventricle: The cavity size was mildly dilated. Systolic function was severely reduced. - Right atrium: The atrium was mildly to moderately dilated. - Pulmonary arteries: Systolic pressure was severely increased. - Pericardium, extracardiac: A trivial pericardial effusion was identified.  Impressions:  - Severe global reduction in LV function; severe LVH; grade 1 diastolic dysfunction; biatrial enlargement; severe RV dysfunction; calcified aortic valve with moderate AS and moderate AI; mild TR with severely elevated pulmonary pressure.  PATIENTManbir Loschiavo PowellMRN: [id number] DOB: 1952-01-19DATE OF PROCEDURE: 11/05/2014  INDICATIONS: Kyle [name] is a 64 y.o. male who presents with progressive ischemic symptoms of his left lower extremity. He presents for arteriography.  PROCEDURE:  1. Ultrasound-guided access to the right common femoral artery 2. Aortogram with bilateral iliac arteriogram and bilateral lower extremity runoff 3. Retrograde right femoral arteriogram  SURGEON: [name]. Scot Dock, MD, FACS  ANESTHESIA: local with sedation   EBL: minimal  TECHNIQUE: The patient was taken to the peripheral vascular lab and received 1 mg of Versed and 50 g of fentanyl. Both groins were prepped and draped in usual sterile fashion. The left common femoral artery did not appear to be patent by duplex. I therefore elected to try to cannulate above his  bypass graft on the right. Under all sound guidance, after the skin was anesthetized, the right common femoral artery was cannulated above the level of the bypass graft with a micropuncture needle. A micropuncture sheath was introduced over a wire. This was then exchanged for a 5 French sheath over a versa core wire. The pigtail catheter was positioned at the L1 vertebral body and flush aortogram obtained. The catheter was then advanced in a lateral projection was obtained. The catheter was in position above the aortic bifurcation and oblique iliac projection was obtained. Next bilateral lower from the runoff films were obtained. Next the pigtail catheter was removed over a wire and a retrograde right iliac arteriogram obtained with right lower extremity runoff.  FINDINGS:  1. There are single renal arteries bilaterally with no significant renal artery stenosis identified. There is diffuse disease of the distal infrarenal aorta. There is disease of the left common iliac artery. The external iliac artery on the left is occluded as is the common femoral artery. There is reconstitution of the deep femoral artery on the left. The superficial femoral arteries occluded at its origin and there is reconstitution of the above-knee pop to artery and the left. There is single-vessel runoff on the left via the posterior tibial artery. The anterior tibial arteries are occluded. 2. On the right side the common iliac and external iliac artery are patent. There is severe diffuse disease of the external iliac artery on the left. The common femoral and deep femoral artery are patent. The superficial femoral artery is occluded at its origin. The bypass graft on the right is occluded. There is significant collaterals in the thigh but no patent vein and vessels in the distal thigh or leg on the right.  CLINICAL NOTE: The patient will need to be considered for either aortobifemoral bypass grafting or axillobifemoral bypass  grafting. He has significant cardiomyopathy with ejection fraction of 20-25%. I'll have him evaluated preoperatively by Dr. Gervase Colberg Martinique. The patient has extensive multilevel disease bilaterally.  [name], MD, FACS [facility]  DATE OF DICTATION: 11/05/2014  Assessment / Plan: 1. Chronic systolic CHF. Clinically with good response to medical therapy. patient diuresed 21 lbs. BP improved but still high. Exam is much better. He has severe cardiomyopathy that has not improved despite medical therapy. This is nonischemic based on prior cath. It is possible that his AV disease is playing a significant role and the severity of his aortic stenosis may be underestimated due to low EF (low gradient AS).  We will increase quinapril to 20 mg daily. Check BMET and BNP. Will continue his other medication and sodium restriction. Will arrange for Dobutamine Echo to further assess his AV. If this demonstrates that his AS is severe we will need to consider for AV replacement. If AS is not severe we will continue medical Rx for CHF and clear him for vascular surgery.   2. HTN uncontrolled but improved. Will adjust therapy as noted. Close follow up with titration of meds as tolerated.   3. PAD s/p right fem-pop bypass. See arteriogram above. Severe claudication with severe PAD. Evaluated by [name]. Will need to consider surgical revascularization.  4. Hyperlipidemia. Refilled Pravastatin 40 mg daily.  5. Biscuspid AV with moderate AS/AI. Possibly low gradient severe AS due to low EF. Follow up Dobutamine Echo.

## 2014-11-09 NOTE — Patient Instructions (Signed)
Increase Quinapril to 20 mg daily  Continue your other medication  We will schedule you for a Dobutamine Echo to assess your aortic valve.

## 2014-11-10 LAB — BRAIN NATRIURETIC PEPTIDE: Brain Natriuretic Peptide: 997.9 pg/mL — ABNORMAL HIGH (ref 0.0–100.0)

## 2014-11-12 ENCOUNTER — Telehealth: Payer: Self-pay | Admitting: Cardiology

## 2014-11-12 DIAGNOSIS — E875 Hyperkalemia: Secondary | ICD-10-CM

## 2014-11-12 DIAGNOSIS — I5023 Acute on chronic systolic (congestive) heart failure: Secondary | ICD-10-CM

## 2014-11-12 LAB — BASIC METABOLIC PANEL
BUN: 13 mg/dL (ref 6–23)
CALCIUM: 9.5 mg/dL (ref 8.4–10.5)
CHLORIDE: 103 meq/L (ref 96–112)
CO2: 30 meq/L (ref 19–32)
Creat: 0.99 mg/dL (ref 0.50–1.35)
GLUCOSE: 116 mg/dL — AB (ref 70–99)
POTASSIUM: 5.1 meq/L (ref 3.5–5.3)
SODIUM: 143 meq/L (ref 135–145)

## 2014-11-12 NOTE — Telephone Encounter (Signed)
See previous 11/12/14 note.

## 2014-11-12 NOTE — Telephone Encounter (Signed)
Alyson had some critical labs to report for this pt. Please call  Thanks

## 2014-11-12 NOTE — Telephone Encounter (Signed)
Received call from Maudie Mercury at [place].She called to report patient's potassium 6.5.Stated blood was hemolyzed.  Patient called spoke to wife advised patient will need to have bmet repeated today.

## 2014-11-13 ENCOUNTER — Other Ambulatory Visit: Payer: Self-pay

## 2014-11-13 DIAGNOSIS — I5022 Chronic systolic (congestive) heart failure: Secondary | ICD-10-CM

## 2014-11-13 MED ORDER — CARVEDILOL 12.5 MG PO TABS: 12.5000 mg | ORAL_TABLET | Freq: Two times a day (BID) | ORAL | Status: DC

## 2014-11-13 MED ORDER — PRAVASTATIN SODIUM 40 MG PO TABS: ORAL_TABLET | ORAL | Status: DC

## 2014-11-15 ENCOUNTER — Other Ambulatory Visit (HOSPITAL_COMMUNITY): Payer: No Typology Code available for payment source

## 2014-11-20 ENCOUNTER — Other Ambulatory Visit (HOSPITAL_COMMUNITY): Payer: No Typology Code available for payment source

## 2014-11-22 ENCOUNTER — Other Ambulatory Visit (HOSPITAL_COMMUNITY): Payer: Self-pay | Admitting: Cardiology

## 2014-11-22 ENCOUNTER — Ambulatory Visit (HOSPITAL_COMMUNITY): Payer: No Typology Code available for payment source

## 2014-11-22 ENCOUNTER — Other Ambulatory Visit (HOSPITAL_COMMUNITY): Payer: No Typology Code available for payment source | Admitting: *Deleted

## 2014-11-22 ENCOUNTER — Ambulatory Visit (HOSPITAL_COMMUNITY): Payer: No Typology Code available for payment source | Attending: Cardiology

## 2014-11-22 DIAGNOSIS — Z01810 Encounter for preprocedural cardiovascular examination: Secondary | ICD-10-CM

## 2014-11-22 DIAGNOSIS — I350 Nonrheumatic aortic (valve) stenosis: Secondary | ICD-10-CM | POA: Diagnosis present

## 2014-11-22 MED ORDER — DOBUTAMINE INFUSION FOR EP/ECHO/NUC (1000 MCG/ML)
20.0000 ug/kg/min | Freq: Once | INTRAVENOUS | Status: AC
  Administered 2014-11-22: 20 ug/kg/min via INTRAVENOUS

## 2014-11-22 NOTE — Progress Notes (Signed)
Dobutamine Echocardiogram performed.

## 2014-11-23 ENCOUNTER — Other Ambulatory Visit: Payer: Self-pay

## 2014-11-23 MED ORDER — AMLODIPINE BESYLATE 5 MG PO TABS: 5.0000 mg | ORAL_TABLET | Freq: Every day | ORAL | Status: DC

## 2014-11-23 MED ORDER — PRAVASTATIN SODIUM 40 MG PO TABS: ORAL_TABLET | ORAL | Status: DC

## 2014-12-03 ENCOUNTER — Encounter: Payer: Self-pay | Admitting: Vascular Surgery

## 2014-12-05 ENCOUNTER — Ambulatory Visit (INDEPENDENT_AMBULATORY_CARE_PROVIDER_SITE_OTHER): Payer: No Typology Code available for payment source | Admitting: Vascular Surgery

## 2014-12-05 ENCOUNTER — Encounter: Payer: Self-pay | Admitting: Vascular Surgery

## 2014-12-05 VITALS — BP 178/83 | HR 84 | Resp 18 | Ht 70.5 in | Wt 145.8 lb

## 2014-12-05 DIAGNOSIS — I70229 Atherosclerosis of native arteries of extremities with rest pain, unspecified extremity: Secondary | ICD-10-CM

## 2014-12-05 NOTE — Progress Notes (Signed)
[facility] [place]  Patient name: Kyle [name] MRN: [id number] DOB: 05-05-1951 Sex: male  REASON FOR VISIT: Follow up of multilevel arterial occlusive disease.  HPI: Kyle [name] is a 64 y.o. male blade seen on 10/24/2014 with rest pain of the left foot. On exam he had evidence of multilevel arterial occlusive disease. He was set up for an arteriogram.  He underwent an arteriogram on 11/05/2014. Showed diffuse aortoiliac occlusive disease and bilateral superficial femoral artery occlusions. On the left side, which was the side of concern, there was reconstitution of the above-knee popliteal artery with single vessel runoff via the posterior tibial artery which was occluded above the ankle.  The patient continues to have rest pain in the left foot. He has no symptoms on the right side. He has bilateral lower extremity claudication. He has no nonhealing wounds.  The patient has a significant cardiac history and is followed by Dr. Peter Martinique. He is being worked up for possible revascularization. He has undergone dobutamine echocardiogram results of which are described below.  Past Medical History  Diagnosis Date  . Aortic insufficiency     MODERATE WITH A BICUSPID AORTIC VAVLE  . Hypertension   . Dilated cardiomyopathy     WITH EJECTION FRACTION DOWN TO 20-25%--WITH CONGESTIVE HEART FAILURE  . CHF (congestive heart failure)   . SOB (shortness of breath)   . Orthopnea   . Edema     LOWER EXTREMETIES  . COPD (chronic obstructive pulmonary disease)   . Arterial occlusive disease     MULTILEVEL  . Peripheral arterial disease   . Pulmonary hypertension   . Normal coronary arteries 2009   Family History  Problem Relation Age of Onset  . Diabetes Mother   . Hyperlipidemia Sister   . Hypertension Sister    SOCIAL HISTORY: History  Substance Use Topics  . Smoking status: Former Smoker -- 1.00 packs/day  . Smokeless tobacco: Never Used     Comment: quit  2010  . Alcohol Use: 0.0 oz/week    0 Standard drinks or equivalent per week   No Known Allergies Current Outpatient Prescriptions  Medication Sig Dispense Refill  . amLODipine (NORVASC) 5 MG tablet Take 1 tablet (5 mg total) by mouth daily. 30 tablet 6  . aspirin 81 MG chewable tablet Chew 81 mg by mouth daily.      . carvedilol (COREG) 12.5 MG tablet Take 1 tablet (12.5 mg total) by mouth 2 (two) times daily with a meal. 60 tablet 6  . furosemide (LASIX) 40 MG tablet Take 1 tablet (40 mg total) by mouth daily. 30 tablet 11  . pravastatin (PRAVACHOL) 40 MG tablet Take 1 tablet every night 30 tablet 6  . quinapril (ACCUPRIL) 20 MG tablet Take 1 tablet (20 mg total) by mouth at bedtime. 90 tablet 3   No current facility-administered medications for this visit.   REVIEW OF SYSTEMS: Valu.Nieves ] denotes positive finding; [  ] denotes negative finding  CARDIOVASCULAR:  [ ]  chest pain   [ ]  chest pressure   [ ]  palpitations   [ ]  orthopnea   Valu.Nieves ] dyspnea on exertion   Valu.Nieves ] claudication   Valu.Nieves ] rest pain   [ ]  DVT   [ ]  phlebitis PULMONARY:   [ ]  productive cough   [ ]  asthma   [ ]  wheezing NEUROLOGIC:   [ ]  weakness  [ ]  paresthesias  [ ]  aphasia  [ ]  amaurosis  [ ]   dizziness HEMATOLOGIC:   [ ]  bleeding problems   [ ]  clotting disorders MUSCULOSKELETAL:  [ ]  joint pain   [ ]  joint swelling [ ]  leg swelling GASTROINTESTINAL: [ ]   blood in stool  [ ]   hematemesis GENITOURINARY:  [ ]   dysuria  [ ]   hematuria PSYCHIATRIC:  [ ]  history of major depression INTEGUMENTARY:  [ ]  rashes  [ ]  ulcers CONSTITUTIONAL:  [ ]  fever   [ ]  chills  PHYSICAL EXAM: Filed Vitals:   12/05/14 1247  BP: 178/83  Pulse: 84  Resp: 18  Height: 5' 10.5" (1.791 m)  Weight: 145 lb 12.8 oz (66.134 kg)   Body mass index is 20.62 kg/(m^2). GENERAL: The patient is a well-nourished male, in no acute distress. The vital signs are documented above. CARDIOVASCULAR: There is a regular rate and rhythm. He has a normal right femoral  pulse. I cannot palpate a left femoral pulse. I cannot palpate pedal pulses. PULMONARY: There is good air exchange bilaterally without wheezing or rales. ABDOMEN: Soft and non-tender with normal pitched bowel sounds.  MUSCULOSKELETAL: There are no major deformities or cyanosis. NEUROLOGIC: No focal weakness or paresthesias are detected. SKIN: There are no ulcers or rashes noted. PSYCHIATRIC: The patient has a normal affect.  DATA:  His dobutamine echocardiogram shows a baseline ejection fraction of 20-25%. He has severe LVH. He has a bicuspid aortic valve with moderate stenosis.  MEDICAL ISSUES: MULTILEVEL ARTERIAL OCCLUSIVE DISEASE WITH REST PAIN OF THE LEFT FOOT:  I have had a long discussion today with the patient and his wife. I've explained that if his symptoms are tolerable we could continue with conservative treatment. He is not a smoker. I have encouraged him to ambulate as much as possible. Discussed the importance of nutrition. He is on aspirin and is on a statin. If he felt that his symptoms were not tolerable then the surgical options would be aortobifemoral bypass graft, left axillobifemoral bypass graft, or right to left femorofemoral bypass. I've explained that aortobifemoral bypass graft would be associated with the best patency and likely the best improvement in his circulation. He does have bilateral superficial femoral artery occlusions. I have also explained that this would be associated with increased risk given his cardiac history. The other option would be axillobifemoral bypass graft which is associated with significantly lower patency and slightly less improvement in perfusion. The third option would be a right-to-left femorofemoral bypass graft, however given that he does have some external iliac artery occlusive disease on the right this would increase his risk of graft thrombosis and given his superficial femoral artery occlusion on the right this could result in some steal  symptoms on the right side. This reason if he elected to proceed with surgery I would favor aortobifemoral bypass grafting. Currently he feels that his symptoms are tolerable and would like to continue with conservative treatment. I plan on seeing him back in 2 months. We will obtain ABIs when he comes back in 2 months. If his symptoms progress in the meantime then we could consider surgery and he tells me that he has been cleared I Dr. Peter Martinique from a cardiac standpoint.     [place] [facility] [place] Beeper: [phone]

## 2014-12-06 NOTE — Addendum Note (Signed)
Addended by: Mena Goes on: 12/06/2014 02:01 PM   Modules accepted: Orders

## 2014-12-14 ENCOUNTER — Ambulatory Visit: Payer: No Typology Code available for payment source | Admitting: Cardiology

## 2014-12-16 ENCOUNTER — Encounter (HOSPITAL_COMMUNITY): Payer: Self-pay | Admitting: Emergency Medicine

## 2014-12-16 ENCOUNTER — Inpatient Hospital Stay (HOSPITAL_COMMUNITY)
Admission: EM | Admit: 2014-12-16 | Discharge: 2014-12-24 | DRG: 270 | Disposition: A | Discharge status: Home Health | Payer: No Typology Code available for payment source | Attending: Vascular Surgery | Admitting: Vascular Surgery

## 2014-12-16 DIAGNOSIS — I420 Dilated cardiomyopathy: Secondary | ICD-10-CM | POA: Diagnosis present

## 2014-12-16 DIAGNOSIS — R339 Retention of urine, unspecified: Secondary | ICD-10-CM | POA: Diagnosis not present

## 2014-12-16 DIAGNOSIS — I7409 Other arterial embolism and thrombosis of abdominal aorta: Secondary | ICD-10-CM | POA: Diagnosis present

## 2014-12-16 DIAGNOSIS — J449 Chronic obstructive pulmonary disease, unspecified: Secondary | ICD-10-CM | POA: Diagnosis present

## 2014-12-16 DIAGNOSIS — I5022 Chronic systolic (congestive) heart failure: Secondary | ICD-10-CM | POA: Diagnosis present

## 2014-12-16 DIAGNOSIS — I739 Peripheral vascular disease, unspecified: Secondary | ICD-10-CM

## 2014-12-16 DIAGNOSIS — E43 Unspecified severe protein-calorie malnutrition: Secondary | ICD-10-CM | POA: Insufficient documentation

## 2014-12-16 DIAGNOSIS — I129 Hypertensive chronic kidney disease with stage 1 through stage 4 chronic kidney disease, or unspecified chronic kidney disease: Secondary | ICD-10-CM | POA: Diagnosis present

## 2014-12-16 DIAGNOSIS — I70222 Atherosclerosis of native arteries of extremities with rest pain, left leg: Secondary | ICD-10-CM | POA: Diagnosis present

## 2014-12-16 DIAGNOSIS — Q231 Congenital insufficiency of aortic valve: Secondary | ICD-10-CM | POA: Diagnosis not present

## 2014-12-16 DIAGNOSIS — E785 Hyperlipidemia, unspecified: Secondary | ICD-10-CM | POA: Diagnosis present

## 2014-12-16 DIAGNOSIS — I350 Nonrheumatic aortic (valve) stenosis: Secondary | ICD-10-CM

## 2014-12-16 DIAGNOSIS — Z9889 Other specified postprocedural states: Secondary | ICD-10-CM

## 2014-12-16 DIAGNOSIS — I10 Essential (primary) hypertension: Secondary | ICD-10-CM | POA: Diagnosis present

## 2014-12-16 DIAGNOSIS — R0902 Hypoxemia: Secondary | ICD-10-CM

## 2014-12-16 DIAGNOSIS — M79672 Pain in left foot: Secondary | ICD-10-CM | POA: Diagnosis not present

## 2014-12-16 DIAGNOSIS — I998 Other disorder of circulatory system: Secondary | ICD-10-CM

## 2014-12-16 DIAGNOSIS — N189 Chronic kidney disease, unspecified: Secondary | ICD-10-CM | POA: Diagnosis present

## 2014-12-16 DIAGNOSIS — R0603 Acute respiratory distress: Secondary | ICD-10-CM

## 2014-12-16 DIAGNOSIS — I272 Other secondary pulmonary hypertension: Secondary | ICD-10-CM | POA: Diagnosis present

## 2014-12-16 DIAGNOSIS — Z6820 Body mass index (BMI) 20.0-20.9, adult: Secondary | ICD-10-CM

## 2014-12-16 HISTORY — DX: Other arterial embolism and thrombosis of abdominal aorta: I74.09

## 2014-12-16 LAB — URINALYSIS, ROUTINE W REFLEX MICROSCOPIC
Bilirubin Urine: NEGATIVE
Glucose, UA: NEGATIVE mg/dL
Hgb urine dipstick: NEGATIVE
Ketones, ur: NEGATIVE mg/dL
LEUKOCYTES UA: NEGATIVE
NITRITE: NEGATIVE
PROTEIN: NEGATIVE mg/dL
SPECIFIC GRAVITY, URINE: 1.015 (ref 1.005–1.030)
UROBILINOGEN UA: 1 mg/dL (ref 0.0–1.0)
pH: 5.5 (ref 5.0–8.0)

## 2014-12-16 LAB — CBC
HCT: 45.4 % (ref 39.0–52.0)
Hemoglobin: 15.2 g/dL (ref 13.0–17.0)
MCH: 30.6 pg (ref 26.0–34.0)
MCHC: 33.5 g/dL (ref 30.0–36.0)
MCV: 91.3 fL (ref 78.0–100.0)
Platelets: 215 10*3/uL (ref 150–400)
RBC: 4.97 MIL/uL (ref 4.22–5.81)
RDW: 17.2 % — ABNORMAL HIGH (ref 11.5–15.5)
WBC: 5.9 10*3/uL (ref 4.0–10.5)

## 2014-12-16 LAB — SURGICAL PCR SCREEN
MRSA, PCR: NEGATIVE
STAPHYLOCOCCUS AUREUS: NEGATIVE

## 2014-12-16 LAB — COMPREHENSIVE METABOLIC PANEL
ALK PHOS: 72 U/L (ref 39–117)
ALT: 18 U/L (ref 0–53)
AST: 24 U/L (ref 0–37)
Albumin: 3.3 g/dL — ABNORMAL LOW (ref 3.5–5.2)
Anion gap: 5 (ref 5–15)
BUN: 16 mg/dL (ref 6–23)
CALCIUM: 9.3 mg/dL (ref 8.4–10.5)
CO2: 30 mmol/L (ref 19–32)
CREATININE: 0.99 mg/dL (ref 0.50–1.35)
Chloride: 104 mmol/L (ref 96–112)
GFR, EST NON AFRICAN AMERICAN: 86 mL/min — AB (ref >90)
GLUCOSE: 175 mg/dL — AB (ref 70–99)
Potassium: 4.5 mmol/L (ref 3.5–5.1)
Sodium: 139 mmol/L (ref 135–145)
Total Bilirubin: 0.7 mg/dL (ref 0.3–1.2)
Total Protein: 7 g/dL (ref 6.0–8.3)

## 2014-12-16 LAB — PROTIME-INR
INR: 1.04 (ref 0.00–1.49)
Prothrombin Time: 13.8 seconds (ref 11.6–15.2)

## 2014-12-16 MED ORDER — CARVEDILOL 12.5 MG PO TABS
12.5000 mg | ORAL_TABLET | Freq: Two times a day (BID) | ORAL | Status: DC
  Administered 2014-12-16 – 2014-12-24 (×14): 12.5 mg via ORAL
  Filled 2014-12-16 (×19): qty 1

## 2014-12-16 MED ORDER — PRAVASTATIN SODIUM 40 MG PO TABS
40.0000 mg | ORAL_TABLET | Freq: Every day | ORAL | Status: DC
  Administered 2014-12-16 – 2014-12-23 (×6): 40 mg via ORAL
  Filled 2014-12-16 (×10): qty 1

## 2014-12-16 MED ORDER — PANTOPRAZOLE SODIUM 40 MG PO TBEC
40.0000 mg | DELAYED_RELEASE_TABLET | Freq: Every day | ORAL | Status: DC
  Administered 2014-12-16 – 2014-12-17 (×2): 40 mg via ORAL
  Filled 2014-12-16 (×2): qty 1

## 2014-12-16 MED ORDER — KCL IN DEXTROSE-NACL 20-5-0.45 MEQ/L-%-% IV SOLN
INTRAVENOUS | Status: DC
  Administered 2014-12-16: 14:00:00 via INTRAVENOUS
  Administered 2014-12-17: 50 mL/h via INTRAVENOUS
  Administered 2014-12-18: 05:00:00 via INTRAVENOUS
  Filled 2014-12-16 (×5): qty 1000

## 2014-12-16 MED ORDER — POTASSIUM CHLORIDE CRYS ER 20 MEQ PO TBCR: 20.0000 meq | EXTENDED_RELEASE_TABLET | Freq: Once | ORAL | Status: DC

## 2014-12-16 MED ORDER — ALUM & MAG HYDROXIDE-SIMETH 200-200-20 MG/5ML PO SUSP: 15.0000 mL | ORAL | Status: DC | PRN

## 2014-12-16 MED ORDER — QUINAPRIL HCL 10 MG PO TABS
20.0000 mg | ORAL_TABLET | Freq: Every day | ORAL | Status: DC
  Administered 2014-12-17: 20 mg via ORAL
  Filled 2014-12-16 (×2): qty 2

## 2014-12-16 MED ORDER — LABETALOL HCL 5 MG/ML IV SOLN
10.0000 mg | INTRAVENOUS | Status: DC | PRN
  Filled 2014-12-16: qty 4

## 2014-12-16 MED ORDER — FUROSEMIDE 40 MG PO TABS
40.0000 mg | ORAL_TABLET | Freq: Every day | ORAL | Status: DC
  Administered 2014-12-17: 40 mg via ORAL
  Filled 2014-12-16 (×2): qty 1

## 2014-12-16 MED ORDER — METOPROLOL TARTRATE 1 MG/ML IV SOLN: 2.0000 mg | INTRAVENOUS | Status: DC | PRN

## 2014-12-16 MED ORDER — CEFAZOLIN SODIUM 1-5 GM-% IV SOLN
1.0000 g | Freq: Three times a day (TID) | INTRAVENOUS | Status: DC
Start: 2014-12-16 — End: 2014-12-23
  Administered 2014-12-16 – 2014-12-23 (×20): 1 g via INTRAVENOUS
  Filled 2014-12-16 (×24): qty 50

## 2014-12-16 MED ORDER — ACETAMINOPHEN 325 MG PO TABS: 325.0000 mg | ORAL_TABLET | ORAL | Status: DC | PRN

## 2014-12-16 MED ORDER — MORPHINE SULFATE 2 MG/ML IJ SOLN
2.0000 mg | INTRAMUSCULAR | Status: DC | PRN
  Administered 2014-12-18: 2 mg via INTRAVENOUS
  Administered 2014-12-18: 4 mg via INTRAVENOUS
  Administered 2014-12-18: 2 mg via INTRAVENOUS
  Administered 2014-12-18 – 2014-12-20 (×8): 4 mg via INTRAVENOUS
  Administered 2014-12-20: 2 mg via INTRAVENOUS
  Administered 2014-12-20: 4 mg via INTRAVENOUS
  Administered 2014-12-20: 5 mg via INTRAVENOUS
  Administered 2014-12-20 (×3): 4 mg via INTRAVENOUS
  Filled 2014-12-16 (×5): qty 2
  Filled 2014-12-16 (×3): qty 1
  Filled 2014-12-16 (×8): qty 2
  Filled 2014-12-16: qty 3

## 2014-12-16 MED ORDER — GUAIFENESIN-DM 100-10 MG/5ML PO SYRP: 15.0000 mL | ORAL_SOLUTION | ORAL | Status: DC | PRN

## 2014-12-16 MED ORDER — AMLODIPINE BESYLATE 5 MG PO TABS
5.0000 mg | ORAL_TABLET | Freq: Every day | ORAL | Status: DC
  Administered 2014-12-17: 5 mg via ORAL
  Filled 2014-12-16 (×3): qty 1

## 2014-12-16 MED ORDER — ASPIRIN 81 MG PO CHEW
81.0000 mg | CHEWABLE_TABLET | Freq: Every day | ORAL | Status: DC
  Administered 2014-12-17 – 2014-12-24 (×7): 81 mg via ORAL
  Filled 2014-12-16 (×7): qty 1

## 2014-12-16 MED ORDER — OXYCODONE-ACETAMINOPHEN 5-325 MG PO TABS
1.0000 | ORAL_TABLET | ORAL | Status: DC | PRN
  Administered 2014-12-16 – 2014-12-22 (×12): 2 via ORAL
  Filled 2014-12-16 (×12): qty 2

## 2014-12-16 MED ORDER — HYDRALAZINE HCL 20 MG/ML IJ SOLN: 5.0000 mg | INTRAMUSCULAR | Status: DC | PRN

## 2014-12-16 MED ORDER — ACETAMINOPHEN 650 MG RE SUPP: 325.0000 mg | RECTAL | Status: DC | PRN

## 2014-12-16 MED ORDER — ONDANSETRON HCL 4 MG/2ML IJ SOLN: 4.0000 mg | Freq: Four times a day (QID) | INTRAMUSCULAR | Status: DC | PRN

## 2014-12-16 MED ORDER — ENOXAPARIN SODIUM 40 MG/0.4ML ~~LOC~~ SOLN
40.0000 mg | SUBCUTANEOUS | Status: DC
  Administered 2014-12-16 – 2014-12-17 (×2): 40 mg via SUBCUTANEOUS
  Filled 2014-12-16 (×3): qty 0.4

## 2014-12-16 MED ORDER — PHENOL 1.4 % MT LIQD: 1.0000 | OROMUCOSAL | Status: DC | PRN

## 2014-12-16 NOTE — ED Provider Notes (Signed)
MSE was initiated and I personally evaluated the patient  at  10:27 AM on December 16, 2014.  He was sent by [name] to the ED for admission for treatment of occlusive disease in his left foot.  [name] has already evaluated the pt and is admitting him with plans to operate.  Pt is  well-appearing and in no acute distress.   The patient appears stable so that the remainder of the MSE may be completed by another provider.  Britt Bottom, NP 12/16/14 [place], MD 12/21/14 [phone]

## 2014-12-16 NOTE — ED Notes (Signed)
Pt c/o pain to B/L legs with ambulation and pain to left foot at rest. Left foot red in color and very tender to touch.

## 2014-12-16 NOTE — ED Notes (Signed)
Pt knows that urine is needed

## 2014-12-16 NOTE — Progress Notes (Signed)
Utilization Review Completed.Minoru Chap T2/21/2016  

## 2014-12-16 NOTE — Progress Notes (Signed)
Pt arrived on unit from ED. Vitals stable. Will continue to monitor.

## 2014-12-16 NOTE — H&P (Signed)
[facility] [place]  Patient name: Kyle [name] MRN: [id number] DOB: 1951/06/23 Sex: male  REASON FOR ADMISSION: Progressive ischemia of left lower extremity  HPI: Kyle [name] is a 64 y.o. male who I have been following with aortoiliac occlusive disease and infrainguinal arterial occlusive disease. I last saw him on 12/05/2014 and he felt that his symptoms in the left leg more tolerable and wished to not proceed with revascularization at that time. However, over the last several days he has developed increasing pain in the left foot which he feels is unbearable. This reason he presented to the emergency department. He has severe rest pain of the left foot. He denies any significant symptoms on the right side.  He has undergone a previous right femoropopliteal bypass graft in 2010 which is chronically occluded. He has a history of congestive heart failure and is followed by Dr. Peter Martinique. He underwent an arteriogram which showed significant aortoiliac occlusive disease. He has significant disease of the external iliac artery on the right with an occluded external iliac artery on the left. He has bilateral superficial femoral artery occlusions.  Given that his rest pain in the left foot is not tolerable he is being admitted for pain control. In addition he has some cellulitis of the foot and will be admitted for intravenous antibiotics.  Past Medical History  Diagnosis Date  . Aortic insufficiency     MODERATE WITH A BICUSPID AORTIC VAVLE  . Hypertension   . Dilated cardiomyopathy     WITH EJECTION FRACTION DOWN TO 20-25%--WITH CONGESTIVE HEART FAILURE  . CHF (congestive heart failure)   . SOB (shortness of breath)   . Orthopnea   . Edema     LOWER EXTREMETIES  . COPD (chronic obstructive pulmonary disease)   . Arterial occlusive disease     MULTILEVEL  . Peripheral arterial disease   . Pulmonary hypertension   . Normal coronary arteries 2009     Family History  Problem Relation Age of Onset  . Diabetes Mother   . Hyperlipidemia Sister   . Hypertension Sister     SOCIAL HISTORY: History  Substance Use Topics  . Smoking status: Former Smoker -- 1.00 packs/day  . Smokeless tobacco: Never Used     Comment: quit 2010  . Alcohol Use: No    No Known Allergies  Current Facility-Administered Medications  Medication Dose Route Frequency Provider Last Rate Last Dose  . acetaminophen (TYLENOL) tablet 325-650 mg  325-650 mg Oral Q4H PRN Angelia Mould, MD       Or  . acetaminophen (TYLENOL) suppository 325-650 mg  325-650 mg Rectal Q4H PRN Angelia Mould, MD      . alum & mag hydroxide-simeth (MAALOX/MYLANTA) 200-200-20 MG/5ML suspension 15-30 mL  15-30 mL Oral Q2H PRN Angelia Mould, MD      . amLODipine (NORVASC) tablet 5 mg  5 mg Oral Daily Angelia Mould, MD      . aspirin chewable tablet 81 mg  81 mg Oral Daily Angelia Mould, MD      . carvedilol (COREG) tablet 12.5 mg  12.5 mg Oral BID WC Angelia Mould, MD      . ceFAZolin (ANCEF) IVPB 1 g/50 mL premix  1 g Intravenous 3 times per day Angelia Mould, MD      . dextrose 5 % and 0.45 % NaCl with KCl 20 mEq/L infusion   Intravenous Continuous Angelia Mould, MD      .  enoxaparin (LOVENOX) injection 40 mg  40 mg Subcutaneous Q24H Angelia Mould, MD      . furosemide (LASIX) tablet 40 mg  40 mg Oral Daily Angelia Mould, MD      . guaiFENesin-dextromethorphan (ROBITUSSIN DM) 100-10 MG/5ML syrup 15 mL  15 mL Oral Q4H PRN Angelia Mould, MD      . hydrALAZINE (APRESOLINE) injection 5 mg  5 mg Intravenous Q20 Min PRN Angelia Mould, MD      . labetalol (NORMODYNE,TRANDATE) injection 10 mg  10 mg Intravenous Q10 min PRN Angelia Mould, MD      . metoprolol (LOPRESSOR) injection 2-5 mg  2-5 mg Intravenous Q2H PRN Angelia Mould, MD      . morphine 2 MG/ML injection 2-5 mg  2-5 mg  Intravenous Q1H PRN Angelia Mould, MD      . ondansetron [facility]) injection 4 mg  4 mg Intravenous Q6H PRN Angelia Mould, MD      . oxyCODONE-acetaminophen (PERCOCET/ROXICET) 5-325 MG per tablet 1-2 tablet  1-2 tablet Oral Q4H PRN Angelia Mould, MD      . pantoprazole (PROTONIX) EC tablet 40 mg  40 mg Oral Daily Angelia Mould, MD      . phenol (CHLORASEPTIC) mouth spray 1 spray  1 spray Mouth/Throat PRN Angelia Mould, MD      . potassium chloride SA (K-DUR,KLOR-CON) CR tablet 20-40 mEq  20-40 mEq Oral Once Angelia Mould, MD   20 mEq at 12/16/14 1215  . pravastatin (PRAVACHOL) tablet 40 mg  40 mg Oral Daily Angelia Mould, MD      . quinapril (ACCUPRIL) tablet 20 mg  20 mg Oral QHS Angelia Mould, MD        REVIEW OF SYSTEMS: Valu.Nieves ] denotes positive finding; [  ] denotes negative finding CARDIOVASCULAR:  [ ]  chest pain   [ ]  chest pressure   [ ]  palpitations   [ ]  orthopnea   [ ]  dyspnea on exertion   Valu.Nieves ] claudication   Valu.Nieves ] rest pain   [ ]  DVT   [ ]  phlebitis PULMONARY:   [ ]  productive cough   [ ]  asthma   [ ]  wheezing NEUROLOGIC:   [ ]  weakness  [ ]  paresthesias  [ ]  aphasia  [ ]  amaurosis  [ ]  dizziness HEMATOLOGIC:   [ ]  bleeding problems   [ ]  clotting disorders MUSCULOSKELETAL:  [ ]  joint pain   [ ]  joint swelling [ ]  leg swelling GASTROINTESTINAL: [ ]   blood in stool  [ ]   hematemesis GENITOURINARY:  [ ]   dysuria  [ ]   hematuria PSYCHIATRIC:  [ ]  history of major depression INTEGUMENTARY:  [ ]  rashes  [ ]  ulcers CONSTITUTIONAL:  [ ]  fever   [ ]  chills  PHYSICAL EXAM: Filed Vitals:   12/16/14 1003 12/16/14 1015 12/16/14 1107 12/16/14 1143  BP: 171/66 139/61 152/68 152/55  Pulse: 67 66 67 64  Temp: 99 F (37.2 C)   98.7 F (37.1 C)  TempSrc:    Oral  Resp: 19 22 15 12   Height: 5\' 11"  (1.803 m)  5\' 11"  (1.803 m) 5\' 11"  (1.803 m)  Weight: 150 lb (68.04 kg)  141 lb 6.4 oz (64.139 kg) 141 lb (63.957 kg)  SpO2: 94%   90%     Body mass index is 19.67 kg/(m^2). GENERAL: The patient is a well-nourished male, in no acute distress. The vital  signs are documented above. CARDIOVASCULAR: There is a regular rate and rhythm. I do not detect carotid bruits. He has a palpable right femoral pulse. I cannot palpate a left femoral pulse. He has no palpable pulses in his feet. Left foot appears chronically ischemic with some mild cellulitis. PULMONARY: There is good air exchange bilaterally without wheezing or rales. ABDOMEN: Soft and non-tender with normal pitched bowel sounds.  MUSCULOSKELETAL: There are no major deformities or cyanosis. NEUROLOGIC: No focal weakness or paresthesias are detected. SKIN: He has cellulitis of the left foot. PSYCHIATRIC: The patient has a normal affect.  DATA:  Lab Results  Component Value Date   WBC 5.9 12/16/2014   HGB 15.2 12/16/2014   HCT 45.4 12/16/2014   MCV 91.3 12/16/2014   PLT 215 12/16/2014   Lab Results  Component Value Date   NA 139 12/16/2014   K 4.5 12/16/2014   CL 104 12/16/2014   CO2 30 12/16/2014   Lab Results  Component Value Date   CREATININE 0.99 12/16/2014   Lab Results  Component Value Date   INR 1.04 12/16/2014   INR 1.1 02/18/2009   INR 1.2 10/17/2008   Lab Results  Component Value Date   HGBA1C * 10/17/2008    6.7 (NOTE)   The ADA recommends the following therapeutic goal for glycemic   control related to Hgb A1C measurement:   Goal of Therapy:   < 7.0% Hgb A1C   Reference: American Diabetes Association: Clinical Practice   Recommendations 2008, Diabetes Care,  2008, 31:(Suppl 1).   I reviewed reviewed his previous arteriogram which shows diffuse disease of the distal infrarenal aorta. There is disease of the left common iliac artery. The external iliac artery on the left is occluded as is the common femoral artery. There is reconstitution of the deep femoral artery on the left. The superficial femoral arteries occluded at its origin and there is  reconstitution of the above-knee pop to artery and the left. There is single-vessel runoff on the left via the posterior tibial artery. The anterior tibial arteries are occluded. On the right side the common iliac arteries patent. The external iliac artery has diffuse disease. Left common femoral and deep femoral artery are patent and the superficial femoral artery is occluded  at its origin.  MEDICAL ISSUES:  AORTOILIAC OCCLUSIVE DISEASE AND BILATERAL INFRAINGUINAL ARTERIAL OCCLUSIVE DISEASE: This patient presents with progressive ischemia of the left lower extremity with limb threatening ischemia of the left leg. He has severe rest pain on the left and is being admitted for pain control and also intravenous antibiotics for cellulitis of the left foot. I have recommended that we proceed with aortobifemoral bypass grafting on Tuesday and possibly also left femoropopliteal bypass grafting. He does have significant congestive heart failure with ejection fraction of 20-25% and is followed by Dr. Peter Martinique. The patient states that he has been cleared by Dr. Peter Martinique however I do not have a note and we will have cardiology look at him tomorrow. His dobutamine stress echo showed left ventricular ejection fraction of 20-25% with severe LVH. He has a bicuspid aortic valve with moderate stenosis. We have discussed the indications for aortobifemoral bypass grafting and the potential complications including but not limited to cardiac complications, bleeding, infection, and limb loss. The risk of mortality or major morbidity is 3-5 %. He is at increased risk for cardiac complications however without revascularization he will require amputation which she is very much opposed to. Given his heart failure  I think that he would have a difficult time keeping an axillobifemoral bypass graft open. His surgery will be scheduled for 12/18/2014.  [place] [facility] [place] Beeper:  [phone]

## 2014-12-16 NOTE — Progress Notes (Signed)
Report received from [place], [place] in ED. Awaiting pt arrival to floor.

## 2014-12-17 DIAGNOSIS — Z01810 Encounter for preprocedural cardiovascular examination: Secondary | ICD-10-CM

## 2014-12-17 LAB — PREPARE RBC (CROSSMATCH)

## 2014-12-17 MED ORDER — ENSURE COMPLETE PO LIQD
237.0000 mL | Freq: Two times a day (BID) | ORAL | Status: DC
  Administered 2014-12-17 – 2014-12-24 (×8): 237 mL via ORAL

## 2014-12-17 MED ORDER — SODIUM CHLORIDE 0.9 % IV SOLN: Freq: Once | INTRAVENOUS | Status: DC

## 2014-12-17 MED ORDER — MAGNESIUM CITRATE PO SOLN
1.0000 | Freq: Once | ORAL | Status: AC
  Administered 2014-12-17: 1 via ORAL
  Filled 2014-12-17 (×2): qty 296

## 2014-12-17 NOTE — Progress Notes (Signed)
INITIAL NUTRITION ASSESSMENT  DOCUMENTATION CODES Per approved criteria  -Severe malnutrition in the context of chronic illness   INTERVENTION: Ensure Complete po BID, each supplement provides 350 kcal and 13 grams of protein  Encouraged pt to continue Ensure or similar supplement at home due to continued weight loss.   NUTRITION DIAGNOSIS: Malnutrition  related to chronic illness as evidenced by severe fat and muscle depletion.   Goal: Pt to meet >/= 90% of their estimated nutrition needs   Monitor:  PO intake, supplement acceptance, weight trends, labs   Reason for Assessment: Pt identified as at nutrition risk on the Malnutrition Screen Tool/RN consult  64 y.o. male   ASSESSMENT: Pt with history of CHF and severe PVD and previous right femoropopliteal bypass graft in 2010 which is chronically occluded who was admitted due to progressive ischemia of left lower extremity/pain.  Pt also has cellulitis of left foot and on abx.  Surgery planned for 2/23 after cardiology eval.   Per pt he has lost a lot of weight but it has been over time, he could not specify how long.  Pt has a great appetite, eating 3 meals per day at home and eating 100% of his meals here. Pt feels that his weight loss was the result of a side effect of one of his medications he was taking PTA.   Per pt he was 168 lb last year but this was with edema, his usual weight is 157 lb without any edema. Pt with 8% weight loss x 1 month.   Nutrition Focused Physical Exam:  Subcutaneous Fat:  Orbital Region: severe depletion Upper Arm Region: severe depletion Thoracic and Lumbar Region: severe depletion  Muscle:  Temple Region: severe depletion Clavicle Bone Region: severe depletion Clavicle and Acromion Bone Region: severe depletion Scapular Bone Region: severe depletion Dorsal Hand: severe depletion Patellar Region: severe depletion Anterior Thigh Region: severe depletion Posterior Calf Region: severe  depletion  Edema: not present   Height: Ht Readings from Last 1 Encounters:  12/16/14 5\' 11"  (1.803 m)    Weight: Wt Readings from Last 1 Encounters:  12/17/14 145 lb 1 oz (65.8 kg)    Ideal Body Weight: 78.1 kg   % Ideal Body Weight: 84%  Wt Readings from Last 10 Encounters:  12/17/14 145 lb 1 oz (65.8 kg)  12/05/14 145 lb 12.8 oz (66.134 kg)  11/09/14 147 lb 14.4 oz (67.087 kg)  11/05/14 156 lb (70.761 kg)  10/24/14 150 lb (68.04 kg)  08/20/14 168 lb (76.204 kg)  05/11/12 160 lb (72.576 kg)  02/03/12 169 lb 14.4 oz (77.066 kg)  10/02/11 165 lb (74.844 kg)  10/15/10 183 lb (83.008 kg)    Usual Body Weight: 157 lb   % Usual Body Weight: 92%  BMI:  Body mass index is 20.24 kg/(m^2).  Estimated Nutritional Needs: Kcal: 1900-2100 Protein: 100-115 grams Fluid: >1.9 L/day  Skin: WDL   Diet Order: Diet Heart Diet NPO time specified Except for: Sips with Meds Meal Completion: 100%   EDUCATION NEEDS: -No education needs identified at this time   Intake/Output Summary (Last 24 hours) at 12/17/14 1102 Last data filed at 12/17/14 0834  Gross per 24 hour  Intake 866.67 ml  Output    550 ml  Net 316.67 ml    Last BM: 2/22   Labs:   Recent Labs Lab 12/16/14 1026  NA 139  K 4.5  CL 104  CO2 30  BUN 16  CREATININE 0.99  CALCIUM 9.3  GLUCOSE 175*    CBG (last 3)  No results for input(s): GLUCAP in the last 72 hours.  Scheduled Meds: . sodium chloride   Intravenous Once  . amLODipine  5 mg Oral Daily  . aspirin  81 mg Oral Daily  . carvedilol  12.5 mg Oral BID WC  .  ceFAZolin (ANCEF) IV  1 g Intravenous 3 times per day  . enoxaparin (LOVENOX) injection  40 mg Subcutaneous Q24H  . furosemide  40 mg Oral Daily  . magnesium citrate  1 Bottle Oral Once  . pantoprazole  40 mg Oral Daily  . potassium chloride  20-40 mEq Oral Once  . pravastatin  40 mg Oral q1800  . quinapril  20 mg Oral Daily    Continuous Infusions: . dextrose 5 % and 0.45 %  NaCl with KCl 20 mEq/L 50 mL/hr (12/17/14 0930)    Past Medical History  Diagnosis Date  . Aortic insufficiency     MODERATE WITH A BICUSPID AORTIC VAVLE  . Hypertension   . Dilated cardiomyopathy     WITH EJECTION FRACTION DOWN TO 20-25%--WITH CONGESTIVE HEART FAILURE  . CHF (congestive heart failure)   . SOB (shortness of breath)   . Orthopnea   . Edema     LOWER EXTREMETIES  . COPD (chronic obstructive pulmonary disease)   . Arterial occlusive disease     MULTILEVEL  . Peripheral arterial disease   . Pulmonary hypertension   . Normal coronary arteries 2009    Past Surgical History  Procedure Laterality Date  . Other surgical history      POST RIGHT FEMOROPOPLITEAL BYPASS GRAFT  . Colonoscopy    . Cardiac catheterization  2009    Nl Cors, EF 20%  . Femoral-popliteal bypass graft  02/21/2011    right  . Abdominal aortagram N/A 11/05/2014    Procedure: ABDOMINAL Maxcine Ham;  Surgeon: Angelia Mould, MD;  Location: [facility] CATH LAB;  Service: Cardiovascular;  Laterality: N/A;  . Peripheral vascular catheterization  11/05/2014    Procedure: LOWER EXTREMITY ANGIOGRAPHY;  Surgeon: Angelia Mould, MD;  Location: [facility] CATH LAB;  Service: Cardiovascular;;    [place], [place], [place] Pager [phone] After Hours Pager

## 2014-12-17 NOTE — Progress Notes (Signed)
Kyle [name] is well known to me. See my office note of 11/10/14. He was recently evaluated with Dobutamine Echo that indicates his AS is moderate and not severe. He has PAD, chronic systolic CHF with EF 123456, HTN, and moderate AS. He has CKD. He is at moderate to high risk for major vascular surgery but presents with progressive symptoms and is at risk of major limb loss. His cardiac status is as well as can be managed. If he has any cardiac problems during his hospital stay please contact us.  Peter Martinique MD, [facility]  12/17/2014 3:34 PM

## 2014-12-17 NOTE — Progress Notes (Addendum)
   [facility] [place]  Subjective  - He is comfortable right now, pain is tolerable with medication.   Objective 121/77 74 97.7 F (36.5 C) (Oral) 16 95%  Intake/Output Summary (Last 24 hours) at 12/17/14 0741 Last data filed at 12/17/14 0200  Gross per 24 hour  Intake 506.67 ml  Output    550 ml  Net -43.33 ml   Left foot erythema, active range of motion is intact no palpable pulses on the left LE. 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 has recommended that we proceed with aortobifemoral bypass grafting on Tuesday and possibly also left femoropopliteal bypass grafting.   Pending cardiology clearance and recommendations we will proceed to the OR tomorrow.  Laurence Slate [facility] 12/17/2014 7:41 AM --  Laboratory Lab Results:  Recent Labs  12/16/14 1026  WBC 5.9  HGB 15.2  HCT 45.4  PLT 215   BMET  Recent Labs  12/16/14 1026  NA 139  K 4.5  CL 104  CO2 30  GLUCOSE 175*  BUN 16  CREATININE 0.99  CALCIUM 9.3    COAG Lab Results  Component Value Date   INR 1.04 12/16/2014   INR 1.1 02/18/2009   INR 1.2 10/17/2008   No results found for: PTT  Agree with above. Given the progressive ischemia of the left lower extremity with intolerable rest pain I think this is progressing to a limb threatening situation. I have recommended aortobifemoral bypass grafting and possible left femoropopliteal bypass grafting. I have discussed the indications for surgery and the potential complications, including, but not limited to, bleeding, wound healing problems, graft infection, limb loss, or other unpredictable medical problems. The risk of mortality or major morbidity is 3-5%. I have ordered a vein map for today. I have spoken with Dr. Martinique who states that given that the aortic stenosis is only moderate and not severe he is cleared for surgery  certainly with some increased risk because of his cardiac issues.

## 2014-12-17 NOTE — Progress Notes (Signed)
Left Lower Extremity Vein Map    Left Great Saphenous Vein   Segment Diameter Comment  1. Origin 4.68mm   2. High Thigh 3.80mm   3. Mid Thigh 2.69mm Branch  4. Low Thigh 2.67mm Branch  5. At Knee 3.23mm   6. High Calf 3.42mm   7. Low Calf 3.64mm   8. Ankle 1.32mm                 Left Small Saphenous Vein  Segment Diameter Comment  1. Origin 3.3mm   2. High Calf 3.7mm   3. Low Calf 2.48mm Branch  4. Ankle                 12/17/2014 3:16 PM Maudry Mayhew, RVT, RDCS, RDMS

## 2014-12-18 ENCOUNTER — Inpatient Hospital Stay (HOSPITAL_COMMUNITY): Payer: No Typology Code available for payment source

## 2014-12-18 ENCOUNTER — Inpatient Hospital Stay (HOSPITAL_COMMUNITY): Payer: No Typology Code available for payment source | Admitting: Anesthesiology

## 2014-12-18 ENCOUNTER — Encounter (HOSPITAL_COMMUNITY)
Admission: EM | Disposition: A | Discharge status: Home Health | Payer: No Typology Code available for payment source | Source: Home / Self Care | Attending: Vascular Surgery

## 2014-12-18 DIAGNOSIS — E43 Unspecified severe protein-calorie malnutrition: Secondary | ICD-10-CM | POA: Insufficient documentation

## 2014-12-18 HISTORY — PX: ENDARTERECTOMY FEMORAL: SHX5804

## 2014-12-18 HISTORY — PX: AORTA - BILATERAL FEMORAL ARTERY BYPASS GRAFT: SHX1175

## 2014-12-18 LAB — POCT I-STAT 3, ART BLOOD GAS (G3+)
ACID-BASE DEFICIT: 3 mmol/L — AB (ref 0.0–2.0)
Bicarbonate: 23.7 mEq/L (ref 20.0–24.0)
O2 Saturation: 86 %
PO2 ART: 53 mmHg — AB (ref 80.0–100.0)
TCO2: 25 mmol/L (ref 0–100)
pCO2 arterial: 45 mmHg (ref 35.0–45.0)
pH, Arterial: 7.326 — ABNORMAL LOW (ref 7.350–7.450)

## 2014-12-18 LAB — BLOOD GAS, ARTERIAL
ACID-BASE EXCESS: 0 mmol/L (ref 0.0–2.0)
BICARBONATE: 25.6 meq/L — AB (ref 20.0–24.0)
O2 CONTENT: 8 L/min
O2 SAT: 85.1 %
PATIENT TEMPERATURE: 97.6
TCO2: 27.3 mmol/L (ref 0–100)
pCO2 arterial: 51.9 mmHg — ABNORMAL HIGH (ref 35.0–45.0)
pH, Arterial: 7.31 — ABNORMAL LOW (ref 7.350–7.450)
pO2, Arterial: 57.8 mmHg — ABNORMAL LOW (ref 80.0–100.0)

## 2014-12-18 LAB — POCT I-STAT 7, (LYTES, BLD GAS, ICA,H+H)
Acid-base deficit: 1 mmol/L (ref 0.0–2.0)
Bicarbonate: 27.2 mEq/L — ABNORMAL HIGH (ref 20.0–24.0)
Bicarbonate: 25 mEq/L — ABNORMAL HIGH (ref 20.0–24.0)
CALCIUM ION: 1.22 mmol/L (ref 1.13–1.30)
CALCIUM ION: 1.15 mmol/L (ref 1.13–1.30)
HEMATOCRIT: 46 % (ref 39.0–52.0)
HEMATOCRIT: 40 % (ref 39.0–52.0)
Hemoglobin: 15.6 g/dL (ref 13.0–17.0)
Hemoglobin: 13.6 g/dL (ref 13.0–17.0)
O2 Saturation: 100 %
O2 Saturation: 100 %
PCO2 ART: 53.5 mmHg — AB (ref 35.0–45.0)
Patient temperature: 36.7
Potassium: 4.8 mmol/L (ref 3.5–5.1)
Potassium: 4.7 mmol/L (ref 3.5–5.1)
SODIUM: 134 mmol/L — AB (ref 135–145)
Sodium: 135 mmol/L (ref 135–145)
TCO2: 29 mmol/L (ref 0–100)
TCO2: 26 mmol/L (ref 0–100)
pCO2 arterial: 47.1 mmHg — ABNORMAL HIGH (ref 35.0–45.0)
pH, Arterial: 7.314 — ABNORMAL LOW (ref 7.350–7.450)
pH, Arterial: 7.332 — ABNORMAL LOW (ref 7.350–7.450)
pO2, Arterial: 215 mmHg — ABNORMAL HIGH (ref 80.0–100.0)
pO2, Arterial: 241 mmHg — ABNORMAL HIGH (ref 80.0–100.0)

## 2014-12-18 LAB — BASIC METABOLIC PANEL
Anion gap: 3 — ABNORMAL LOW (ref 5–15)
BUN: 12 mg/dL (ref 6–23)
CO2: 26 mmol/L (ref 19–32)
Calcium: 8.2 mg/dL — ABNORMAL LOW (ref 8.4–10.5)
Chloride: 103 mmol/L (ref 96–112)
Creatinine, Ser: 0.88 mg/dL (ref 0.50–1.35)
GFR calc Af Amer: >90 mL/min (ref >90)
GFR calc non Af Amer: >90 mL/min (ref >90)
GLUCOSE: 166 mg/dL — AB (ref 70–99)
POTASSIUM: 4.4 mmol/L (ref 3.5–5.1)
SODIUM: 132 mmol/L — AB (ref 135–145)

## 2014-12-18 LAB — APTT: APTT: 37 s (ref 24–37)

## 2014-12-18 LAB — CBC
HEMATOCRIT: 40.6 % (ref 39.0–52.0)
Hemoglobin: 13.6 g/dL (ref 13.0–17.0)
MCH: 30.7 pg (ref 26.0–34.0)
MCHC: 33.5 g/dL (ref 30.0–36.0)
MCV: 91.6 fL (ref 78.0–100.0)
Platelets: 166 10*3/uL (ref 150–400)
RBC: 4.43 MIL/uL (ref 4.22–5.81)
RDW: 16.8 % — AB (ref 11.5–15.5)
WBC: 11.6 10*3/uL — AB (ref 4.0–10.5)

## 2014-12-18 LAB — MAGNESIUM: Magnesium: 2 mg/dL (ref 1.5–2.5)

## 2014-12-18 LAB — PROTIME-INR
INR: 1.15 (ref 0.00–1.49)
Prothrombin Time: 14.9 seconds (ref 11.6–15.2)

## 2014-12-18 SURGERY — CREATION, BYPASS, ARTERIAL, AORTA TO FEMORAL, BILATERAL, USING GRAFT
Anesthesia: General | Site: Groin

## 2014-12-18 MED ORDER — FUROSEMIDE 10 MG/ML IJ SOLN
INTRAMUSCULAR | Status: AC
  Filled 2014-12-18: qty 4

## 2014-12-18 MED ORDER — FENTANYL CITRATE 0.05 MG/ML IJ SOLN
INTRAMUSCULAR | Status: AC
  Filled 2014-12-18: qty 5

## 2014-12-18 MED ORDER — SENNOSIDES-DOCUSATE SODIUM 8.6-50 MG PO TABS
1.0000 | ORAL_TABLET | Freq: Every evening | ORAL | Status: DC | PRN
  Filled 2014-12-18: qty 1

## 2014-12-18 MED ORDER — ONDANSETRON HCL 4 MG/2ML IJ SOLN: 4.0000 mg | Freq: Once | INTRAMUSCULAR | Status: DC | PRN

## 2014-12-18 MED ORDER — PAPAVERINE HCL 30 MG/ML IJ SOLN
INTRAMUSCULAR | Status: AC
  Filled 2014-12-18: qty 2

## 2014-12-18 MED ORDER — HYDROMORPHONE HCL 1 MG/ML IJ SOLN
INTRAMUSCULAR | Status: AC
  Filled 2014-12-18: qty 1

## 2014-12-18 MED ORDER — PROPOFOL 10 MG/ML IV BOLUS
INTRAVENOUS | Status: DC | PRN
  Administered 2014-12-18: 130 mg via INTRAVENOUS

## 2014-12-18 MED ORDER — LIDOCAINE HCL (CARDIAC) 20 MG/ML IV SOLN
INTRAVENOUS | Status: AC
  Filled 2014-12-18: qty 5

## 2014-12-18 MED ORDER — LACTATED RINGERS IV SOLN
INTRAVENOUS | Status: DC | PRN
  Administered 2014-12-18: 07:00:00 via INTRAVENOUS

## 2014-12-18 MED ORDER — MAGNESIUM SULFATE 2 GM/50ML IV SOLN
2.0000 g | Freq: Every day | INTRAVENOUS | Status: DC | PRN
  Filled 2014-12-18: qty 50

## 2014-12-18 MED ORDER — ROCURONIUM BROMIDE 100 MG/10ML IV SOLN
INTRAVENOUS | Status: DC | PRN
  Administered 2014-12-18: 10 mg via INTRAVENOUS
  Administered 2014-12-18: 50 mg via INTRAVENOUS
  Administered 2014-12-18: 10 mg via INTRAVENOUS
  Administered 2014-12-18: 20 mg via INTRAVENOUS
  Administered 2014-12-18: 30 mg via INTRAVENOUS
  Administered 2014-12-18: 20 mg via INTRAVENOUS
  Administered 2014-12-18: 10 mg via INTRAVENOUS

## 2014-12-18 MED ORDER — ROCURONIUM BROMIDE 50 MG/5ML IV SOLN
INTRAVENOUS | Status: AC
  Filled 2014-12-18: qty 2

## 2014-12-18 MED ORDER — DEXMEDETOMIDINE HCL 200 MCG/2ML IV SOLN
INTRAVENOUS | Status: DC | PRN
  Administered 2014-12-18: 40 ug via INTRAVENOUS

## 2014-12-18 MED ORDER — DEXMEDETOMIDINE HCL IN NACL 200 MCG/50ML IV SOLN
INTRAVENOUS | Status: AC
Start: 2014-12-18 — End: 2014-12-18
  Filled 2014-12-18: qty 50

## 2014-12-18 MED ORDER — MIDAZOLAM HCL 2 MG/2ML IJ SOLN
INTRAMUSCULAR | Status: AC
  Filled 2014-12-18: qty 2

## 2014-12-18 MED ORDER — HEPARIN SODIUM (PORCINE) 1000 UNIT/ML IJ SOLN
INTRAMUSCULAR | Status: DC | PRN
  Administered 2014-12-18: 6000 [IU] via INTRAVENOUS

## 2014-12-18 MED ORDER — SUCCINYLCHOLINE CHLORIDE 20 MG/ML IJ SOLN
INTRAMUSCULAR | Status: AC
  Filled 2014-12-18: qty 1

## 2014-12-18 MED ORDER — CEFAZOLIN SODIUM 1-5 GM-% IV SOLN
INTRAVENOUS | Status: DC | PRN
  Administered 2014-12-18: 1 g via INTRAVENOUS

## 2014-12-18 MED ORDER — OXYMETAZOLINE HCL 0.05 % NA SOLN
NASAL | Status: DC | PRN
  Administered 2014-12-18: 3 via NASAL

## 2014-12-18 MED ORDER — THROMBIN 20000 UNITS EX SOLR
CUTANEOUS | Status: AC
  Filled 2014-12-18: qty 20000

## 2014-12-18 MED ORDER — EPHEDRINE SULFATE 50 MG/ML IJ SOLN
INTRAMUSCULAR | Status: AC
  Filled 2014-12-18: qty 1

## 2014-12-18 MED ORDER — EPHEDRINE SULFATE 50 MG/ML IJ SOLN
INTRAMUSCULAR | Status: DC | PRN
  Administered 2014-12-18 (×3): 5 mg via INTRAVENOUS

## 2014-12-18 MED ORDER — HYDROMORPHONE HCL 1 MG/ML IJ SOLN
0.2500 mg | INTRAMUSCULAR | Status: DC | PRN
  Administered 2014-12-18 (×3): 0.5 mg via INTRAVENOUS

## 2014-12-18 MED ORDER — NEOSTIGMINE METHYLSULFATE 10 MG/10ML IV SOLN
INTRAVENOUS | Status: DC | PRN
  Administered 2014-12-18: 5 mg via INTRAVENOUS

## 2014-12-18 MED ORDER — LACTATED RINGERS IV SOLN
INTRAVENOUS | Status: DC | PRN
  Administered 2014-12-18 (×2): via INTRAVENOUS

## 2014-12-18 MED ORDER — HEPARIN SODIUM (PORCINE) 1000 UNIT/ML IJ SOLN
INTRAMUSCULAR | Status: AC
  Filled 2014-12-18: qty 1

## 2014-12-18 MED ORDER — 0.9 % SODIUM CHLORIDE (POUR BTL) OPTIME
TOPICAL | Status: DC | PRN
  Administered 2014-12-18: 3000 mL

## 2014-12-18 MED ORDER — GLYCOPYRROLATE 0.2 MG/ML IJ SOLN
INTRAMUSCULAR | Status: AC
  Filled 2014-12-18: qty 2

## 2014-12-18 MED ORDER — NICARDIPINE HCL IN NACL 20-0.86 MG/200ML-% IV SOLN
INTRAVENOUS | Status: DC | PRN
  Administered 2014-12-18: 5 mg/h via INTRAVENOUS

## 2014-12-18 MED ORDER — LIDOCAINE HCL (CARDIAC) 20 MG/ML IV SOLN
INTRAVENOUS | Status: DC | PRN
  Administered 2014-12-18: 50 mg via INTRAVENOUS

## 2014-12-18 MED ORDER — ALBUMIN HUMAN 5 % IV SOLN
INTRAVENOUS | Status: DC | PRN
  Administered 2014-12-18: 11:00:00 via INTRAVENOUS

## 2014-12-18 MED ORDER — MIDAZOLAM HCL 5 MG/5ML IJ SOLN
INTRAMUSCULAR | Status: DC | PRN
  Administered 2014-12-18: 2 mg via INTRAVENOUS

## 2014-12-18 MED ORDER — SODIUM CHLORIDE 0.9 % IJ SOLN
INTRAMUSCULAR | Status: AC
  Filled 2014-12-18: qty 20

## 2014-12-18 MED ORDER — PHENYLEPHRINE HCL 10 MG/ML IJ SOLN
10.0000 mg | INTRAVENOUS | Status: DC | PRN
  Administered 2014-12-18: 20 ug/min via INTRAVENOUS

## 2014-12-18 MED ORDER — ARTIFICIAL TEARS OP OINT
TOPICAL_OINTMENT | OPHTHALMIC | Status: AC
  Filled 2014-12-18: qty 3.5

## 2014-12-18 MED ORDER — NEOSTIGMINE METHYLSULFATE 10 MG/10ML IV SOLN
INTRAVENOUS | Status: AC
  Filled 2014-12-18: qty 1

## 2014-12-18 MED ORDER — MANNITOL 25 % IV SOLN
INTRAVENOUS | Status: DC | PRN
  Administered 2014-12-18: 25 g via INTRAVENOUS

## 2014-12-18 MED ORDER — ONDANSETRON HCL 4 MG/2ML IJ SOLN
INTRAMUSCULAR | Status: DC | PRN
  Administered 2014-12-18: 4 mg via INTRAVENOUS

## 2014-12-18 MED ORDER — DOCUSATE SODIUM 100 MG PO CAPS
100.0000 mg | ORAL_CAPSULE | Freq: Every day | ORAL | Status: DC
  Administered 2014-12-21 – 2014-12-22 (×2): 100 mg via ORAL
  Filled 2014-12-18 (×4): qty 1

## 2014-12-18 MED ORDER — BISACODYL 10 MG RE SUPP
10.0000 mg | Freq: Every day | RECTAL | Status: DC | PRN
Start: 2014-12-18 — End: 2014-12-24

## 2014-12-18 MED ORDER — POTASSIUM CHLORIDE CRYS ER 20 MEQ PO TBCR
20.0000 meq | EXTENDED_RELEASE_TABLET | Freq: Every day | ORAL | Status: AC | PRN
  Administered 2014-12-22: 20 meq via ORAL
  Filled 2014-12-18: qty 1

## 2014-12-18 MED ORDER — SODIUM CHLORIDE 0.9 % IR SOLN
Status: DC | PRN
  Administered 2014-12-18: 09:00:00

## 2014-12-18 MED ORDER — NICARDIPINE HCL IN NACL 20-0.86 MG/200ML-% IV SOLN
3.0000 mg/h | INTRAVENOUS | Status: DC
Start: 2014-12-18 — End: 2014-12-18
  Administered 2014-12-18: 5 mg/h via INTRAVENOUS
  Filled 2014-12-18 (×2): qty 200

## 2014-12-18 MED ORDER — PROPOFOL 10 MG/ML IV BOLUS
INTRAVENOUS | Status: AC
  Filled 2014-12-18: qty 20

## 2014-12-18 MED ORDER — NICARDIPINE HCL IN NACL 40-0.83 MG/200ML-% IV SOLN
3.0000 mg/h | INTRAVENOUS | Status: DC
  Administered 2014-12-18: 8 mg/h via INTRAVENOUS
  Administered 2014-12-19 – 2014-12-20 (×5): 6 mg/h via INTRAVENOUS
  Administered 2014-12-20: 5 mg/h via INTRAVENOUS
  Filled 2014-12-18 (×7): qty 200

## 2014-12-18 MED ORDER — FENTANYL CITRATE 0.05 MG/ML IJ SOLN
INTRAMUSCULAR | Status: DC | PRN
  Administered 2014-12-18: 50 ug via INTRAVENOUS
  Administered 2014-12-18: 100 ug via INTRAVENOUS
  Administered 2014-12-18 (×2): 50 ug via INTRAVENOUS

## 2014-12-18 MED ORDER — SUFENTANIL CITRATE 50 MCG/ML IV SOLN
INTRAVENOUS | Status: DC | PRN
  Administered 2014-12-18: 35 ug via INTRAVENOUS
  Administered 2014-12-18: 5 ug via INTRAVENOUS
  Administered 2014-12-18: 10 ug via INTRAVENOUS

## 2014-12-18 MED ORDER — SODIUM CHLORIDE 0.9 % IV SOLN
500.0000 mL | Freq: Once | INTRAVENOUS | Status: AC | PRN
Start: 2014-12-18 — End: 2014-12-18

## 2014-12-18 MED ORDER — SUFENTANIL CITRATE 50 MCG/ML IV SOLN
INTRAVENOUS | Status: AC
  Filled 2014-12-18: qty 1

## 2014-12-18 MED ORDER — PROTAMINE SULFATE 10 MG/ML IV SOLN
INTRAVENOUS | Status: DC | PRN
  Administered 2014-12-18: 10 mg via INTRAVENOUS
  Administered 2014-12-18: 30 mg via INTRAVENOUS

## 2014-12-18 MED ORDER — FUROSEMIDE 10 MG/ML IJ SOLN
40.0000 mg | Freq: Every day | INTRAMUSCULAR | Status: DC
  Administered 2014-12-18 – 2014-12-22 (×5): 40 mg via INTRAVENOUS
  Filled 2014-12-18 (×7): qty 4

## 2014-12-18 MED ORDER — GLYCOPYRROLATE 0.2 MG/ML IJ SOLN
INTRAMUSCULAR | Status: DC | PRN
  Administered 2014-12-18: 0.2 mg via INTRAVENOUS
  Administered 2014-12-18: .6 mg via INTRAVENOUS

## 2014-12-18 MED ORDER — ENOXAPARIN SODIUM 40 MG/0.4ML ~~LOC~~ SOLN
40.0000 mg | SUBCUTANEOUS | Status: DC
  Administered 2014-12-19 – 2014-12-23 (×5): 40 mg via SUBCUTANEOUS
  Filled 2014-12-18 (×6): qty 0.4

## 2014-12-18 SURGICAL SUPPLY — 78 items
BAG ISOLATION DRAPE 18X18 (DRAPES) ×3 IMPLANT
BANDAGE ELASTIC 4 VELCRO ST LF (GAUZE/BANDAGES/DRESSINGS) IMPLANT
BANDAGE ESMARK 6X9 LF (GAUZE/BANDAGES/DRESSINGS) IMPLANT
BNDG ESMARK 6X9 LF (GAUZE/BANDAGES/DRESSINGS)
CANISTER SUCTION 2500CC (MISCELLANEOUS) ×4 IMPLANT
CANNULA VESSEL 3MM 2 BLNT TIP (CANNULA) ×8 IMPLANT
CATH EMB 3FR 80CM (CATHETERS) ×4 IMPLANT
CLIP TI MEDIUM 24 (CLIP) ×4 IMPLANT
CLIP TI WIDE RED SMALL 24 (CLIP) ×4 IMPLANT
COVER SURGICAL LIGHT HANDLE (MISCELLANEOUS) ×4 IMPLANT
CUFF TOURNIQUET SINGLE 24IN (TOURNIQUET CUFF) IMPLANT
CUFF TOURNIQUET SINGLE 34IN LL (TOURNIQUET CUFF) IMPLANT
CUFF TOURNIQUET SINGLE 44IN (TOURNIQUET CUFF) IMPLANT
DRAIN CHANNEL 15F RND FF W/TCR (WOUND CARE) IMPLANT
DRAPE ISOLATION BAG 18X18 (DRAPES) ×1
DRAPE X-RAY CASS 24X20 (DRAPES) IMPLANT
DRSG COVADERM 4X10 (GAUZE/BANDAGES/DRESSINGS) IMPLANT
DRSG COVADERM 4X8 (GAUZE/BANDAGES/DRESSINGS) IMPLANT
ELECT BLADE 4.0 EZ CLEAN MEGAD (MISCELLANEOUS) ×4
ELECT BLADE 6.5 EXT (BLADE) ×4 IMPLANT
ELECT REM PT RETURN 9FT ADLT (ELECTROSURGICAL) ×4
ELECTRODE BLDE 4.0 EZ CLN MEGD (MISCELLANEOUS) ×3 IMPLANT
ELECTRODE REM PT RTRN 9FT ADLT (ELECTROSURGICAL) ×3 IMPLANT
EVACUATOR SILICONE 100CC (DRAIN) IMPLANT
GLOVE BIO SURGEON STRL SZ7.5 (GLOVE) ×4 IMPLANT
GLOVE BIOGEL PI IND STRL 8 (GLOVE) ×3 IMPLANT
GLOVE BIOGEL PI INDICATOR 8 (GLOVE) ×1
GOWN STRL REUS W/ TWL LRG LVL3 (GOWN DISPOSABLE) ×9 IMPLANT
GOWN STRL REUS W/TWL LRG LVL3 (GOWN DISPOSABLE) ×3
GRAFT HEMASHIELD 16X8MM (Vascular Products) ×4 IMPLANT
INSERT FOGARTY 61MM (MISCELLANEOUS) ×4 IMPLANT
INSERT FOGARTY SM (MISCELLANEOUS) ×8 IMPLANT
KIT BASIN OR (CUSTOM PROCEDURE TRAY) ×4 IMPLANT
KIT ROOM TURNOVER OR (KITS) ×4 IMPLANT
LIQUID BAND (GAUZE/BANDAGES/DRESSINGS) ×12 IMPLANT
LOOP VESSEL MAXI BLUE (MISCELLANEOUS) ×4 IMPLANT
MARKER GRAFT CORONARY BYPASS (MISCELLANEOUS) ×4 IMPLANT
NS IRRIG 1000ML POUR BTL (IV SOLUTION) ×8 IMPLANT
PACK AORTA (CUSTOM PROCEDURE TRAY) ×4 IMPLANT
PACK PERIPHERAL VASCULAR (CUSTOM PROCEDURE TRAY) ×4 IMPLANT
PAD ARMBOARD 7.5X6 YLW CONV (MISCELLANEOUS) ×8 IMPLANT
PADDING CAST COTTON 6X4 STRL (CAST SUPPLIES) IMPLANT
PROBE PENCIL 8 MHZ STRL DISP (MISCELLANEOUS) IMPLANT
RETAINER VISCERA MED (MISCELLANEOUS) ×4 IMPLANT
SET COLLECT BLD 21X3/4 12 (NEEDLE) IMPLANT
SPONGE SURGIFOAM ABS GEL 100 (HEMOSTASIS) IMPLANT
STAPLER VISISTAT (STAPLE) IMPLANT
STOPCOCK 4 WAY LG BORE MALE ST (IV SETS) IMPLANT
SUT ETHIBOND 5 LR DA (SUTURE) IMPLANT
SUT ETHILON 3 0 PS 1 (SUTURE) IMPLANT
SUT PDS AB 1 TP1 54 (SUTURE) ×8 IMPLANT
SUT PROLENE 2 0 MH 48 (SUTURE) IMPLANT
SUT PROLENE 3 0 SH 48 (SUTURE) IMPLANT
SUT PROLENE 3 0 SH1 36 (SUTURE) ×8 IMPLANT
SUT PROLENE 5 0 C 1 24 (SUTURE) ×20 IMPLANT
SUT PROLENE 5 0 C 1 36 (SUTURE) ×4 IMPLANT
SUT PROLENE 6 0 BV (SUTURE) ×8 IMPLANT
SUT PROLENE 7 0 BV 1 (SUTURE) IMPLANT
SUT SILK 2 0 (SUTURE) ×1
SUT SILK 2 0 FS (SUTURE) ×4 IMPLANT
SUT SILK 2 0 SH CR/8 (SUTURE) ×4 IMPLANT
SUT SILK 2 0 TIES 17X18 (SUTURE)
SUT SILK 2-0 18XBRD TIE 12 (SUTURE) ×3 IMPLANT
SUT SILK 2-0 18XBRD TIE BLK (SUTURE) IMPLANT
SUT SILK 3 0 (SUTURE) ×2
SUT SILK 3 0 TIES 17X18 (SUTURE)
SUT SILK 3-0 18XBRD TIE 12 (SUTURE) ×6 IMPLANT
SUT SILK 3-0 18XBRD TIE BLK (SUTURE) IMPLANT
SUT VIC AB 2-0 CTB1 (SUTURE) ×12 IMPLANT
SUT VIC AB 3-0 SH 27 (SUTURE) ×4
SUT VIC AB 3-0 SH 27X BRD (SUTURE) ×12 IMPLANT
SUT VICRYL 4-0 PS2 18IN ABS (SUTURE) ×16 IMPLANT
SYR TB 1ML LUER SLIP (SYRINGE) ×4 IMPLANT
TOWEL BLUE STERILE X RAY DET (MISCELLANEOUS) ×8 IMPLANT
TRAY FOLEY CATH 16FRSI W/METER (SET/KITS/TRAYS/PACK) ×8 IMPLANT
TUBING EXTENTION W/L.L. (IV SETS) IMPLANT
UNDERPAD 30X30 INCONTINENT (UNDERPADS AND DIAPERS) ×4 IMPLANT
WATER STERILE IRR 1000ML POUR (IV SOLUTION) ×8 IMPLANT

## 2014-12-18 NOTE — Transfer of Care (Signed)
Immediate Anesthesia Transfer of Care Note  Patient: Kyle [name]  Procedure(s) Performed: Procedure(s): AORTOBIFEMORAL BYPASS GRAFT (N/A) ENDARTERECTOMY FEMORAL (Left)  Patient Location: PACU  Anesthesia Type:General  Level of Consciousness: awake, sedated and patient cooperative  Airway & Oxygen Therapy: Patient Spontanous Breathing and Patient connected to face mask oxygen  Post-op Assessment: Report given to RN, Post -op Vital signs reviewed and stable, Patient moving all extremities and Patient moving all extremities X 4  Post vital signs: Reviewed and stable  Last Vitals:  Filed Vitals:   12/18/14 0549  BP: 141/70  Pulse: 58  Temp: 36.3 C  Resp: 20    Complications: No apparent anesthesia complications

## 2014-12-18 NOTE — Progress Notes (Signed)
      Patient reported to have episode of de-saturation and hypertension.  IV fluids were stopped, Nicardipine was started " double strength,"  Vent mask at 55 to increase O2.  He is stable, alert and talking fine.   BP 128/59, SAT 95   Doppler PT monophasic Left Right PT/DP biphasic Abdominal incision with pin point drainage distally.  4 x 4 with paper tape placed over incision. Groins soft dry  S/P Aortobifemoral bypass  Cleophas Yoak MAUREEN PA-C

## 2014-12-18 NOTE — Progress Notes (Addendum)
eLink Physician-Brief Progress Note Patient Name: Kyle [name] DOB: 08-02-1951 MRN: [id number]   Date of Service  12/18/2014  HPI/Events of Note  EF 20%, mod AS S/p aorto-bifem for ischemic LLE Post op hypoxia/ resp acidosis  eICU Interventions  Increase FIo2 Lasix 40 IV PA d 21  Cardene gtt - if BP not controled by lopressor/hydrallazine prn ABG x1 Dc IVFs & quinapril until renal function known to be stable post op    New ICU patient evaluation: This patient was evaluated by the [facility] team. I have reviewed relevant documentation including care plan & orders.    Intervention Category Evaluation Type: New Patient Evaluation  Kristy Catoe V. 12/18/2014, 3:46 PM

## 2014-12-18 NOTE — Progress Notes (Signed)
   VASCULAR SURGERY POSTOP CHECK:  * Stable post op  *  Good doppler flow both feet. Dominant runoff on the left is PT  SUBJECTIVE: Pain well controlled.   PHYSICAL EXAM: Filed Vitals:   12/18/14 1315 12/18/14 1330 12/18/14 1345 12/18/14 1400  BP:  136/72 142/55 138/61  Pulse: 79 60 65 72  Temp: 97.6 F (36.4 C)     TempSrc:      Resp: 7 9 12 16   Height:      Weight:      SpO2: 79% 93% 95% 93%   Good doppler flow both feet.   LABS: Lab Results  Component Value Date   WBC 11.6* 12/18/2014   HGB 13.6 12/18/2014   HCT 40.6 12/18/2014   MCV 91.6 12/18/2014   PLT 166 12/18/2014   Lab Results  Component Value Date   CREATININE 0.99 12/16/2014   Lab Results  Component Value Date   INR 1.15 12/18/2014   Active Problems:   Aortic stenosis   Aortoiliac occlusive disease   Protein-calorie malnutrition, severe   Gae Gallop Beeper: [id number] 12/18/2014

## 2014-12-18 NOTE — Op Note (Signed)
NAME: Kyle [name]   MRN: [id number] DOB: [birth year]    DATE OF OPERATION: 12/18/2014  PREOP DIAGNOSIS: Aortoiliac occlusive disease  POSTOP DIAGNOSIS: same  PROCEDURE:  1. Aortobifemoral bypass 2. Endarterectomy of left femoral artery  SURGEON: [name]. Scot Dock, MD  ASSIST: Adele Barthel M.D., [facility]  ANESTHESIA: Gen.   EBL: per anesthesia record  INDICATIONS: Kyle [name] is a 64 y.o. male who presented with progressive ischemia of the left lower extremity. He had moderate distal aortic disease and disease of the right external iliac artery with an occluded left external iliac artery. Aortobifemoral bypass graft was recommended. Given that he had significant rest pain I was considering doing in addition, a left femoral to above-knee popliteal artery bypass. However preoperative vein mapping and my own duplex in the operating room confirmed that the vein was quite small and therefore I did not want to place a prosthetic femoropopliteal graft given that the inflow procedure would likely correct his rest pain. He was at increased risk for surgery cousin's cardiac history as documented by Dr. Peter Martinique.  FINDINGS: Doppler PT DP postoperatively bilaterally  TECHNIQUE: The patient was taken to the operating room and received a general anesthetic. Monitoring lines had been placed by anesthesia. The abdomen, right groin, and entire left lower extremity were prepped and draped in the usual sterile fashion. A longitudinal incision was made in the left groin. Through this incision the common femoral, deep femoral, and superficial femoral arteries were dissected free. There was plaque in the proximal deep femoral artery and therefore I divided 2 crossing venous branches to allow exposure of the distal deep femoral artery. I was able to get below the plaque on the deep femoral artery.  Next attention was turned to the right groin. A longitudinal incision was made. The patient  had a right femoropopliteal bypass graft. There was significant scar tissue. However, I was able to dissect out the common femoral artery, deep femoral artery, superficial femoral artery, and the vein bypass graft. Retroperitoneum tunnels were started on both sides. Next the abdomen was entered through a midline incision. Exploratory laparotomy revealed some diverticular disease but no other significant findings. The transverse colon was reflected superiorly and the small bowel reflected to the right. The retroperitoneal space was entered and the aorta dissected free up to level of the renal vein. The artery was dissected free circumferentially such that a clamp could be placed proximally. The inferior mesenteric artery was controlled and then tunnels were created to both groins and umbilical tapes were passed.  The patient was heparinized and also received 25 g of mannitol. Clamp was placed on the infrarenal aorta and then on the aorta just above the inferior mesenteric artery. The artery was then divided. There was significant atherosclerotic plaque present here. A segment of the artery was excised and then the distal aorta oversewn with a running 3-0 Prolene suture. Debris was irrigated from the proximal aortic stump. A 16 x 8 graft was selected and cut the appropriate length. It was sewn end-to-end to the infrarenal aorta with running 3-0 Prolene suture. A felt cuff was used. Once the anastomosis was completed the clamp was released and the graft flushed of all debris and then the aorta re-clamped and the graft irrigated. Each of the limbs worse pulldown for anastomosis to the respective groins.  Using a 2 team approach I approach the right groin. The common femoral, superficial femoral, deep femoral arteries were controlled as was the  femoropopliteal bypass graft. A longitudinal arteriotomy was made in the vein patch which extended from the common femoral artery down to the deep femoral artery. I passed the  Fogarty catheter down the femoropopliteal graft and it was no clot retrieved. Right limb of the aortobifemoral graft was cut the appropriate length, spatulated, and sewn end to side to the vein patch using continuous 5-0 Prolene suture. Prior to completing this anastomosis the artery was backbled and flushed properly and the anastomosis completed. Flow was reestablished to the right leg which the patient tolerated from a hemodynamic standpoint. He was good flow in the graft at this point and also in the deep femoral artery.  On the left side Dr. Bridgett Larsson controlled the vessels and opened the artery longitudinally extending onto the deep femoral artery. There was extensive plaque which was endarterectomized. The artery was irrigated with saline to remove all debris and then the left limb of the graft was cut to the appropriate length, spatulated, and sewn end to side to the common femoral artery extending onto the deep femoral artery using continuous 5-0 Prolene suture. The arteries were backbled and flushed before completing anastomosis and the anastomosis was completed. Flow was reestablished to the left leg which the patient tolerated from a hemodynamic standpoint.   Attention was then turned to the abdomen. Hemostasis was obtained and the abdomen the abdomen was irrigated. The retroperitoneum is closed with running 2-0 Vicryl. The abdominal contents were returned to their normal position. The fascia was closed with running #1 PDS suture. The subcutaneous layer was closed with running 3-0 Vicryl and the skin closed with a 4-0 subcuticular stitch.  Hemostasis was obtained in both groins. Each groin was closed with a deep layer of 2-0 Vicryl, subcutaneous layer 3-0 Vicryl and the skin closed with a 4-0 subcuticular stitch. Dermabond was applied. The patient tolerated the procedure well and transferred to the recovery room in stable condition. All needle and sponge counts were correct.  [name], MD,  FACS [facility]  DATE OF DICTATION:   12/18/2014

## 2014-12-18 NOTE — Interval H&P Note (Signed)
History and Physical Interval Note:  12/18/2014 7:13 AM  Kyle [name]  has presented today for surgery, with the diagnosis of Ischemic left lower extremity M62.89  The various methods of treatment have been discussed with the patient and family. After consideration of risks, benefits and other options for treatment, the patient has consented to  Procedure(s): AORTOBIFEMORAL BYPASS GRAFT (N/A) BYPASS GRAFT FEMORAL-POPLITEAL ARTERY (Left) as a surgical intervention .  The patient's history has been reviewed, patient examined, no change in status, stable for surgery.  I have reviewed the patient's chart and labs.  Questions were answered to the patient's satisfaction.     [name] S

## 2014-12-18 NOTE — Anesthesia Procedure Notes (Signed)
Procedure Name: Intubation Date/Time: 12/18/2014 7:39 AM Performed by: Izora Gala Pre-anesthesia Checklist: Patient identified, Emergency Drugs available, Suction available and Patient being monitored Patient Re-evaluated:Patient Re-evaluated prior to inductionOxygen Delivery Method: Circle system utilized Preoxygenation: Pre-oxygenation with 100% oxygen Intubation Type: IV induction Ventilation: Mask ventilation without difficulty and Oral airway inserted - appropriate to patient size Laryngoscope Size: Miller and 3 Grade View: Grade I Tube type: Oral Tube size: 8.0 mm Number of attempts: 1 Airway Equipment and Method: Stylet Placement Confirmation: ETT inserted through vocal cords under direct vision,  positive ETCO2 and breath sounds checked- equal and bilateral Secured at: 22 cm Tube secured with: Tape Dental Injury: Teeth and Oropharynx as per pre-operative assessment

## 2014-12-18 NOTE — Anesthesia Postprocedure Evaluation (Signed)
  Anesthesia Post-op Note  Patient: Kyle [name]  Procedure(s) Performed: Procedure(s): AORTOBIFEMORAL BYPASS GRAFT (N/A) ENDARTERECTOMY FEMORAL (Left)  Patient Location: PACU  Anesthesia Type:General  Level of Consciousness: awake, alert  and oriented  Airway and Oxygen Therapy: Patient Spontanous Breathing and Patient connected to nasal cannula oxygen  Post-op Pain: none  Post-op Assessment: Post-op Vital signs reviewed, Patient's Cardiovascular Status Stable, Respiratory Function Stable, Patent Airway and Pain level controlled  Post-op Vital Signs: stable  Last Vitals:  Filed Vitals:   12/18/14 1430  BP: 133/57  Pulse: 68  Temp:   Resp: 18    Complications: No apparent anesthesia complications

## 2014-12-18 NOTE — Anesthesia Preprocedure Evaluation (Signed)
Anesthesia Evaluation  Patient identified by MRN, date of birth, ID band Patient awake    Reviewed: Allergy & Precautions, NPO status , Patient's Chart, lab work & pertinent test results  Airway Mallampati: II  TM Distance: >3 FB Neck ROM: Full    Dental  (+) Edentulous Upper, Edentulous Lower   Pulmonary former smoker,  breath sounds clear to auscultation        Cardiovascular hypertension, Rhythm:Regular Rate:Normal     Neuro/Psych    GI/Hepatic   Endo/Other    Renal/GU      Musculoskeletal   Abdominal   Peds  Hematology   Anesthesia Other Findings   Reproductive/Obstetrics                             Anesthesia Physical Anesthesia Plan  ASA: III  Anesthesia Plan: General   Post-op Pain Management:    Induction: Intravenous  Airway Management Planned: Oral ETT  Additional Equipment: Arterial line and PA Cath  Intra-op Plan:   Post-operative Plan: Extubation in OR  Informed Consent: I have reviewed the patients History and Physical, chart, labs and discussed the procedure including the risks, benefits and alternatives for the proposed anesthesia with the patient or authorized representative who has indicated his/her understanding and acceptance.     Plan Discussed with: CRNA and Anesthesiologist  Anesthesia Plan Comments:         Anesthesia Quick Evaluation

## 2014-12-19 ENCOUNTER — Inpatient Hospital Stay (HOSPITAL_COMMUNITY): Payer: No Typology Code available for payment source

## 2014-12-19 LAB — CBC
HCT: 40.8 % (ref 39.0–52.0)
Hemoglobin: 14 g/dL (ref 13.0–17.0)
MCH: 30.6 pg (ref 26.0–34.0)
MCHC: 34.3 g/dL (ref 30.0–36.0)
MCV: 89.1 fL (ref 78.0–100.0)
Platelets: 153 10*3/uL (ref 150–400)
RBC: 4.58 MIL/uL (ref 4.22–5.81)
RDW: 16.9 % — ABNORMAL HIGH (ref 11.5–15.5)
WBC: 18.6 10*3/uL — AB (ref 4.0–10.5)

## 2014-12-19 LAB — URINALYSIS, ROUTINE W REFLEX MICROSCOPIC
Bilirubin Urine: NEGATIVE
Glucose, UA: NEGATIVE mg/dL
KETONES UR: NEGATIVE mg/dL
NITRITE: NEGATIVE
PH: 5 (ref 5.0–8.0)
PROTEIN: 30 mg/dL — AB
Specific Gravity, Urine: 1.027 (ref 1.005–1.030)
Urobilinogen, UA: 0.2 mg/dL (ref 0.0–1.0)

## 2014-12-19 LAB — COMPREHENSIVE METABOLIC PANEL
ALBUMIN: 3.4 g/dL — AB (ref 3.5–5.2)
ALK PHOS: 53 U/L (ref 39–117)
ALT: 13 U/L (ref 0–53)
ANION GAP: 6 (ref 5–15)
AST: 27 U/L (ref 0–37)
BILIRUBIN TOTAL: 1 mg/dL (ref 0.3–1.2)
BUN: 14 mg/dL (ref 6–23)
CO2: 26 mmol/L (ref 19–32)
Calcium: 8.7 mg/dL (ref 8.4–10.5)
Chloride: 102 mmol/L (ref 96–112)
Creatinine, Ser: 0.94 mg/dL (ref 0.50–1.35)
GFR calc Af Amer: >90 mL/min (ref >90)
GFR calc non Af Amer: 88 mL/min — ABNORMAL LOW (ref >90)
Glucose, Bld: 194 mg/dL — ABNORMAL HIGH (ref 70–99)
POTASSIUM: 4.3 mmol/L (ref 3.5–5.1)
Sodium: 134 mmol/L — ABNORMAL LOW (ref 135–145)
TOTAL PROTEIN: 6.5 g/dL (ref 6.0–8.3)

## 2014-12-19 LAB — TROPONIN I
Troponin I: 0.05 ng/mL — ABNORMAL HIGH (ref <0.031)
Troponin I: 0.06 ng/mL — ABNORMAL HIGH (ref <0.031)

## 2014-12-19 LAB — URINE MICROSCOPIC-ADD ON

## 2014-12-19 LAB — AMYLASE: Amylase: 38 U/L (ref 0–105)

## 2014-12-19 LAB — BRAIN NATRIURETIC PEPTIDE: B Natriuretic Peptide: 401.6 pg/mL — ABNORMAL HIGH (ref 0.0–100.0)

## 2014-12-19 LAB — MAGNESIUM: Magnesium: 2.4 mg/dL (ref 1.5–2.5)

## 2014-12-19 MED FILL — Heparin Sodium (Porcine) Inj 1000 Unit/ML: INTRAMUSCULAR | Qty: 30 | Status: AC

## 2014-12-19 MED FILL — Sodium Chloride IV Soln 0.9%: INTRAVENOUS | Qty: 2000 | Status: AC

## 2014-12-19 NOTE — Progress Notes (Addendum)
   [facility] [place]  Subjective  - Sitting up in bed.  Non labored breathing with Venti mask at 55% O2.  Pain is well controlled.  He states his left foot feels like a new foot, the pain is gone.   Objective 126/79 95 100.2 F (37.9 C) (Core (Comment)) 29 93%  Intake/Output Summary (Last 24 hours) at 12/19/14 0731 Last data filed at 12/19/14 0600  Gross per 24 hour  Intake   4520 ml  Output   3430 ml  Net   1090 ml    Doppler signal biphasic left PT, right PT/DP Abdomin soft, non tender, no BS auscultated. Abdomin and groin incisions C/D/I Lungs diminished in all lung Pearl Berlinger. NG tube in place zero output   Assessment/Planning: POD # 1 Aortobifemoral bypass Venti mask 55%, O 2 SAT 95% UO 750 cc over night,Chest x ray results pending Nicardipine IV for hypertension, IVF D/C yesterday CHF WBC elevated 18.6 up from 11.6 yesterday,  will send UA and D/C foley D/C A line, and swan. Cr 0.94 he was given Lasix yesterday evening 40 mg times one dose, will order BNP and consider re dosing lasix.   Laurence Slate [facility] 12/19/2014 7:31 AM -- Agree with above Still no gut function continue NPO for now Continue NG tube until flatus Hypoxia requiring 50% face mask.  Lateral leads inverted t waves borderline troponin elevated BNP,  If continues to trend up will have cardiology review pt Tachycardic.  Will give his coreg.  Does not seem to be all pain issue Keep in ICU today  Ruta Hinds, MD [facility] [place]: [phone] Pager: [phone]  Laboratory Lab Results:  Recent Labs  12/18/14 1320 12/19/14 0435  WBC 11.6* 18.6*  HGB 13.6 14.0  HCT 40.6 40.8  PLT 166 153   BMET  Recent Labs  12/18/14 1320 12/19/14 0435  NA 132* 134*  K 4.4 4.3  CL 103 102  CO2 26 26  GLUCOSE 166* 194*  BUN 12 14  CREATININE 0.88 0.94  CALCIUM 8.2* 8.7    COAG Lab Results  Component Value Date   INR 1.15  12/18/2014   INR 1.04 12/16/2014   INR 1.1 02/18/2009   No results found for: PTT

## 2014-12-19 NOTE — Evaluation (Signed)
Physical Therapy Evaluation Patient Details Name: Kyle [name] MRN: [id number] DOB: 01/16/1951 Today's Date: 12/19/2014   History of Present Illness  Pt is a 64 yo male with extensive cardiac history including CHF, R fempop bypass in 2010,aortic insufficiency, HTN, and COPD. 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  Clinical Impression  Pt admitted for above. Pt OOB mobility currently limited by desaturation into 70s when sitting EOB despite pt reports of "feeling fine." Pts SpO2 did improve to 90 in standing however pt then had to undergo 12 lead EKG. Pt alert and motivated despite abdominal pain and difficulty maintaining appropriate SpO2 saturation levels. Anticipate once pt's ability to breath improves he will progress quickly and be able to return home with spouse however if this does not happen pt may need to go to ST-SNF to allow for increased time to maximize functional recovery.    Follow Up Recommendations Home health PT;Supervision/Assistance - 24 hour (SNF if unable to keep SpO2 up)    Equipment Recommendations  Rolling walker with 5" wheels    Recommendations for Other Services       Precautions / Restrictions Precautions Precautions: Fall Precaution Comments: on 55% venti mask, desats with mobility, large abdominal incision Restrictions Weight Bearing Restrictions: No      Mobility  Bed Mobility Overal bed mobility: Needs Assistance Bed Mobility: Sidelying to Sit;Rolling;Sit to Sidelying Rolling: Mod assist;+2 for safety/equipment Sidelying to sit: Mod assist;+2 for safety/equipment     Sit to sidelying: Mod assist;+2 for safety/equipment General bed mobility comments: pt instructed to hold pillow over abdomen as splint. PT assisted with trunk elevation and LE management with directional v/c's and RN Tech managed lines. pt assisted with LE mmvt pt sat EOB x 5 min SpO2 77-88. when doing LE ROM at 77% on 55% venti mask  Transfers Overall transfer level: Needs  assistance Equipment used: 2 person hand held assist Transfers: Sit to/from Stand Sit to Stand: Mod assist;+2 physical assistance;+2 safety/equipment         General transfer comment: pt braced abd with pillow. RN tech and PT assisted pt into standing. pt stood x 2 min bracing with back of legs on bed. Pt unable to achieve full upright posture due to abd incisional pain. Pt SpO2 at 90%.  Ambulation/Gait             General Gait Details: unable to at this time  Stairs            Wheelchair Mobility    Modified Rankin (Stroke Patients Only)       Balance Overall balance assessment: Needs assistance Sitting-balance support: Feet supported;No upper extremity supported Sitting balance-Leahy Scale: Good     Standing balance support: No upper extremity supported Standing balance-Leahy Scale: Poor Standing balance comment: braced with back of legs on bed x 2 min                             Pertinent Vitals/Pain Pain Assessment: Faces Pain Score: 7  Faces Pain Scale: Hurts even more Pain Location: abdominal incision Pain Intervention(s): Monitored during session    Home Living Family/patient expects to be discharged to:: Private residence Living Arrangements: Spouse/significant other Available Help at Discharge: Family;Available 24 hours/day Type of Home: House Home Access: Stairs to enter Entrance Stairs-Rails: Can reach both Entrance Stairs-Number of Steps: 18 Home Layout: One level Home Equipment: None      Prior Function Level of Independence: Independent  Hand Dominance   Dominant Hand: Right    Extremity/Trunk Assessment   Upper Extremity Assessment: Generalized weakness (limited by incision)           Lower Extremity Assessment: Generalized weakness      Cervical / Trunk Assessment:  (abdominal incision)  Communication   Communication: No difficulties  Cognition Arousal/Alertness: Awake/alert Behavior  During Therapy: WFL for tasks assessed/performed Overall Cognitive Status: Within Functional Limits for tasks assessed                      General Comments      Exercises        Assessment/Plan    PT Assessment Patient needs continued PT services  PT Diagnosis Difficulty walking;Generalized weakness   PT Problem List Decreased strength;Decreased range of motion;Decreased activity tolerance;Decreased balance;Decreased mobility  PT Treatment Interventions DME instruction;Gait training;Stair training;Functional mobility training;Therapeutic activities;Therapeutic exercise   PT Goals (Current goals can be found in the Care Plan section) Acute Rehab PT Goals Patient Stated Goal: home PT Goal Formulation: With patient Time For Goal Achievement: 12/26/14 Potential to Achieve Goals: Good    Frequency Min 3X/week   Barriers to discharge        Co-evaluation               End of Session   Activity Tolerance: Treatment limited secondary to medical complications (Comment) Patient left: in bed;with call bell/phone within reach;with nursing/sitter in room Nurse Communication: Mobility status         Time: 0850-0907 PT Time Calculation (min) (ACUTE ONLY): 17 min   Charges:   PT Evaluation $Initial PT Evaluation Tier I: 1 Procedure     PT G CodesKingsley Callander 12/19/2014, 10:06 AM   Kittie Plater, PT, DPT Pager #: [phone] Office #: [phone]

## 2014-12-19 NOTE — Evaluation (Signed)
Occupational Therapy Evaluation Patient Details Name: Kyle [name] MRN: [id number] DOB: 1951/09/16 Today's Date: 12/19/2014    History of Present Illness Pt is a 64 yo male with extensive cardiac history including CHF, R fempop bypass in 2010,aortic insufficiency, HTN, and COPD. 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   Clinical Impression   Pt was independent prior to admission.  Limited today by abdominal pain, 02 sats monitored throughout.  Pt eager to get out of bed.  Presents with generalized weakness and impaired balance interfering with ADL and mobility and requiring +2 assist. Anticipate pt will progress for home discharge as pain and pulmonary status improve. Will follow.    Follow Up Recommendations  Home health OT;Supervision/Assistance - 24 hour (depending on progress)    Equipment Recommendations  None recommended by OT    Recommendations for Other Services       Precautions / Restrictions Precautions Precautions: Fall Precaution Comments: on 55% venti mask, desats with mobility, large abdominal incision Restrictions Weight Bearing Restrictions: No      Mobility Bed Mobility Overal bed mobility: Needs Assistance Bed Mobility: Rolling;Sidelying to Sit Rolling: Mod assist;+2 for safety/equipment Sidelying to sit: Mod assist;+2 for safety/equipment       General bed mobility comments: splinted abdomen with pillow, instructed in log roll technique, assisted with trunk and to guide LEs  Transfers Overall transfer level: Needs assistance Equipment used: 2 person hand held assist Transfers: Sit to/from [facility] Sit to Stand: +2 physical assistance;Mod assist Stand pivot transfers: +2 physical assistance;Min assist       General transfer comment: RN and OT assisting, assist to rise and for balance in standing    Balance Overall balance assessment: Needs assistance Sitting-balance support: Feet supported Sitting balance-Leahy Scale: Good        Standing balance-Leahy Scale: Poor Standing balance comment: unable to stand erect due to pain                            ADL Overall ADL's : Needs assistance/impaired Eating/Feeding: NPO   Grooming: Wash/dry face;Set up;Sitting;Oral care   Upper Body Bathing: Moderate assistance;Sitting   Lower Body Bathing: Total assistance;Sit to/from stand   Upper Body Dressing : Minimal assistance;Sitting   Lower Body Dressing: Total assistance;Sit to/from stand                 General ADL Comments: Abdominal incisional pain and presence of foley interfering with ability to reach feet for ADL.  Used pillow to splint abdomen during bed mobility and transfer.     Vision     Perception     Praxis      Pertinent Vitals/Pain Pain Assessment: Faces Faces Pain Scale: Hurts whole lot Pain Location: abdomen Pain Descriptors / Indicators: Guarding;Grimacing Pain Intervention(s): Limited activity within patient's tolerance;Monitored during session;Repositioned;Patient requesting pain meds-RN notified     Hand Dominance Right   Extremity/Trunk Assessment Upper Extremity Assessment Upper Extremity Assessment: Overall WFL for tasks assessed   Lower Extremity Assessment Lower Extremity Assessment: Defer to PT evaluation       Communication Communication Communication: No difficulties   Cognition Arousal/Alertness: Awake/alert Behavior During Therapy: WFL for tasks assessed/performed Overall Cognitive Status: Within Functional Limits for tasks assessed                     General Comments       Exercises       Shoulder Instructions  Home Living Family/patient expects to be discharged to:: Private residence Living Arrangements: Spouse/significant other Available Help at Discharge: Family;Available 24 hours/day Type of Home: House Home Access: Stairs to enter [employer] of Steps: 18 Entrance Stairs-Rails: Can reach both Home  Layout: One level         Bathroom Toilet: Standard     Home Equipment: None   Additional Comments: Pt states he has access to a walker, cane, shower seat or 3 in1 if needed from a relative      Prior Functioning/Environment Level of Independence: Independent             OT Diagnosis: Generalized weakness;Acute pain   OT Problem List: Decreased strength;Decreased activity tolerance;Impaired balance (sitting and/or standing);Decreased knowledge of use of DME or AE;Decreased safety awareness;Cardiopulmonary status limiting activity;Pain   OT Treatment/Interventions: Self-care/ADL training;DME and/or AE instruction;Therapeutic activities;Balance training;Patient/family education    OT Goals(Current goals can be found in the care plan section) Acute Rehab OT Goals Patient Stated Goal: home OT Goal Formulation: With patient Time For Goal Achievement: 01/02/15 Potential to Achieve Goals: Good ADL Goals Pt Will Perform Grooming: with supervision;standing Pt Will Perform Lower Body Bathing: with supervision;sit to/from stand Pt Will Perform Lower Body Dressing: with supervision;sit to/from stand Pt Will Transfer to Toilet: with supervision;ambulating;regular height toilet Pt Will Perform Toileting - Clothing Manipulation and hygiene: with supervision;sit to/from stand Additional ADL Goal #1: Pt will perform bed mobility with supervision using log roll technique.  OT Frequency: Min 2X/week   Barriers to D/C:            Co-evaluation              End of Session Equipment Utilized During Treatment: Oxygen Nurse Communication: Mobility status  Activity Tolerance: Patient limited by pain Patient left: in chair;with call bell/phone within reach;with nursing/sitter in room   Time: 1335-1402 OT Time Calculation (min): 27 min Charges:  OT General Charges $OT Visit: 1 Procedure OT Evaluation $Initial OT Evaluation Tier I: 1 Procedure OT Treatments $Self Care/Home  Management : 8-22 mins G-Codes:    Malka So 12/19/2014, 3:08 PM  [phone]

## 2014-12-20 ENCOUNTER — Encounter (HOSPITAL_COMMUNITY): Payer: Self-pay | Admitting: Vascular Surgery

## 2014-12-20 LAB — TYPE AND SCREEN
ABO/RH(D): O POS
ANTIBODY SCREEN: NEGATIVE
Unit division: 0
Unit division: 0

## 2014-12-20 LAB — TROPONIN I: TROPONIN I: 0.05 ng/mL — AB (ref <0.031)

## 2014-12-20 MED ORDER — LABETALOL HCL 5 MG/ML IV SOLN
10.0000 mg | INTRAVENOUS | Status: DC | PRN
  Filled 2014-12-20: qty 4

## 2014-12-20 MED ORDER — PANTOPRAZOLE SODIUM 40 MG IV SOLR
40.0000 mg | INTRAVENOUS | Status: DC
  Administered 2014-12-20 – 2014-12-22 (×3): 40 mg via INTRAVENOUS
  Filled 2014-12-20 (×4): qty 40

## 2014-12-20 MED ORDER — SODIUM CHLORIDE 0.45 % IV SOLN
INTRAVENOUS | Status: DC
  Administered 2014-12-20: 50 mL/h via INTRAVENOUS
  Administered 2014-12-22: 11:00:00 via INTRAVENOUS

## 2014-12-20 MED ORDER — HYDRALAZINE HCL 20 MG/ML IJ SOLN: 10.0000 mg | INTRAMUSCULAR | Status: DC | PRN

## 2014-12-20 NOTE — Addendum Note (Signed)
Addendum  created 12/20/14 2254 by Roberts Gaudy, MD   Modules edited: Anesthesia Attestations

## 2014-12-20 NOTE — Progress Notes (Addendum)
   [facility] [place]  Subjective  - He is alert and oriented.  His left foot pain is gone that he had prior to surgery.     Objective 104/55 73 97.9 F (36.6 C) (Oral) 17 97%  Intake/Output Summary (Last 24 hours) at 12/20/14 0740 Last data filed at 12/20/14 0600  Gross per 24 hour  Intake 1167.75 ml  Output   1750 ml  Net -582.25 ml    Doppler signal Left PT, left foot warm and well perfused Right foot warm well perfused Abdominal incision C/D/I Positive BS, abdomen soft. Groins soft incisions healing well. Heart rate 75, BP 104/55 on 5 mg/hr of Nicardipine Lungs CTA, decreased breath sounds   Assessment/Planning: POD # 2 Aortobifemoral bypass D/C NG tube zero output Tm 100.8, labs pending PO meds with sip of water otherwise npo until flatus or BM Ventimask D/C'd now on 5L O2  Transfer to step down later today  Laurence Slate [facility] 12/20/2014 7:40 AM --  Abdomen soft still no flatus or BM, d/c NG but keep NPO except meds until more gut function IVF 50 ml/hr Pulmonary status still marginal but improved continue IS MI ruled out by enzymes Incisions healing Need to ambulate Leukocytosis trend for now may need further workup if progress to fever Keep foley for at least another 24 hr since urinary retention yesterday. D/c nicardipine use PRN hydralazine labetalol  Ruta Hinds, MD [facility] [place]: [phone] Pager: [phone]   Laboratory Lab Results:  Recent Labs  12/18/14 1320 12/19/14 0435  WBC 11.6* 18.6*  HGB 13.6 14.0  HCT 40.6 40.8  PLT 166 153   BMET  Recent Labs  12/18/14 1320 12/19/14 0435  NA 132* 134*  K 4.4 4.3  CL 103 102  CO2 26 26  GLUCOSE 166* 194*  BUN 12 14  CREATININE 0.88 0.94  CALCIUM 8.2* 8.7    COAG Lab Results  Component Value Date   INR 1.15 12/18/2014   INR 1.04 12/16/2014   INR 1.1 02/18/2009   No results found for: PTT    Spoke  with Dr. Oneida Alar he wants to keep foley, no ice chips D/C NG.  Orders adjusted. COLLINS, EMMA MAUREEN PA-C

## 2014-12-20 NOTE — Care Management Note (Addendum)
    Page 1 of 1   12/21/2014     3:00:35 PM CARE MANAGEMENT NOTE 12/21/2014  Patient:  Kyle [name], Kyle [name]   Account Number:  [id number]  Date Initiated:  12/19/2014  Documentation initiated by:  Lean Jaeger  Subjective/Objective Assessment:   s/p ortobifemoral BPG/endarterectomy; lives with spouse     Anticipated DC Date:  12/24/2014   Anticipated DC Plan:  [place]  CM consult      [facility] arranged  [place] - 11 Patient Refused      Status of service:  In process, will continue to follow Medicare Important Message given?  YES (If response is "NO", the following Medicare IM given date fields will be blank) Date Medicare IM given:  12/20/2014 Medicare IM given by:  Eston Heslin Date Additional Medicare IM given:   Additional Medicare IM given by:    Per UR Regulation:  Reviewed for med. necessity/level of care/duration of stay  If discussed at [place] of Stay Meetings, dates discussed:   12/20/2014   Comments:  12/21/14 [place] MSN BSN CCM Met again with pt and discussed home health PT/OT services. Pt again refuses, states he has had home therapy before and does not need therapy services.  12/20/14 [place] MSN BSN CCM Pt states spouse will be present 24/7 and can provide assistance as needed.  Pt has rolling walker and other equipment that belonged to his sister.  Discussed home health therapy vs rehab @ SNF - pt refuses to consider either option, states he will return home and will not need any further therapy.

## 2014-12-21 LAB — BASIC METABOLIC PANEL
Anion gap: 6 (ref 5–15)
BUN: 23 mg/dL (ref 6–23)
CALCIUM: 8.2 mg/dL — AB (ref 8.4–10.5)
CO2: 28 mmol/L (ref 19–32)
Chloride: 99 mmol/L (ref 96–112)
Creatinine, Ser: 0.91 mg/dL (ref 0.50–1.35)
GFR calc Af Amer: >90 mL/min (ref >90)
GFR calc non Af Amer: 89 mL/min — ABNORMAL LOW (ref >90)
Glucose, Bld: 140 mg/dL — ABNORMAL HIGH (ref 70–99)
Potassium: 3.7 mmol/L (ref 3.5–5.1)
SODIUM: 133 mmol/L — AB (ref 135–145)

## 2014-12-21 LAB — CBC
HCT: 39 % (ref 39.0–52.0)
Hemoglobin: 13.1 g/dL (ref 13.0–17.0)
MCH: 31 pg (ref 26.0–34.0)
MCHC: 33.6 g/dL (ref 30.0–36.0)
MCV: 92.2 fL (ref 78.0–100.0)
Platelets: 155 10*3/uL (ref 150–400)
RBC: 4.23 MIL/uL (ref 4.22–5.81)
RDW: 17.4 % — ABNORMAL HIGH (ref 11.5–15.5)
WBC: 17 10*3/uL — ABNORMAL HIGH (ref 4.0–10.5)

## 2014-12-21 NOTE — Progress Notes (Signed)
Occupational Therapy Treatment Patient Details Name: Kyle [name] MRN: [id number] DOB: February 23, 1951 Today's Date: 12/21/2014    History of present illness Pt is a 64 yo male with extensive cardiac history including CHF, R fempop bypass in 2010,aortic insufficiency, HTN, and COPD. 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   OT comments  Pt moving much better with less pain at incision site.  Focus of session on functional mobility, seated grooming and LB dressing.  Cues needed for pacing and to monitor breathing.  Pt requiring 4L 02 during ambulation to maintain sats in upper 80s to low 90s.  Pt is eager to discharge home.  Follow Up Recommendations  Home health OT    Equipment Recommendations  None recommended by OT    Recommendations for Other Services      Precautions / Restrictions Precautions Precautions: Fall Precaution Comments: on 4L sats 88-92%, watch sats       Mobility Bed Mobility Overal bed mobility: Needs Assistance Bed Mobility: Rolling;Sidelying to Sit Rolling: Min assist Sidelying to sit: Min assist       General bed mobility comments: cues for sequence with assist to fully rotate trunk and elevate from surface, used log roll technique  Transfers Overall transfer level: Needs assistance   Transfers: Sit to/from Stand Sit to Stand: Supervision         General transfer comment: cues for hand placement    Balance Overall balance assessment: Needs assistance   Sitting balance-Leahy Scale: Good       Standing balance-Leahy Scale: Fair                     ADL Overall ADL's : Needs assistance/impaired     Grooming: Wash/dry face;Set up;Sitting;Oral care           Upper Body Dressing : Set up;Sitting   Lower Body Dressing: Bed level;Modified independent Lower Body Dressing Details (indicate cue type and reason): able to bring foot to hands in supine to reach socks     Toileting- Clothing Manipulation and Hygiene: Total assistance;Sit  to/from stand Toileting - Clothing Manipulation Details (indicate cue type and reason): unable to release walker for pericare       General ADL Comments: Improvement in pain with pt able to access his feet and ambulate.  Not yet able to tolerate standing at sink for grooming.      Vision                     Perception     Praxis      Cognition   Behavior During Therapy: WFL for tasks assessed/performed Overall Cognitive Status: Within Functional Limits for tasks assessed                       Extremity/Trunk Assessment               Exercises    Shoulder Instructions       General Comments      Pertinent Vitals/ Pain       Pain Assessment: Faces Faces Pain Scale: Hurts little more Pain Location: abdomen with bed mobility Pain Descriptors / Indicators: Guarding;Grimacing Pain Intervention(s): Monitored during session;Repositioned  Home Living                                          Prior Functioning/Environment  Frequency Min 2X/week     Progress Toward Goals  OT Goals(current goals can now be found in the care plan section)  Progress towards OT goals: Progressing toward goals  Acute Rehab OT Goals Patient Stated Goal: home  Plan Discharge plan remains appropriate    Co-evaluation    PT/OT/SLP Co-Evaluation/Treatment: Yes Reason for Co-Treatment: Other (comment) (lines, required +2 upon eval)   OT goals addressed during session: ADL's and self-care      End of Session Equipment Utilized During Treatment: Oxygen (4L when ambulating)   Activity Tolerance Patient tolerated treatment well   Patient Left in chair;with call bell/phone within reach   Nurse Communication          Time: [id number] OT Time Calculation (min): 38 min  Charges: OT General Charges $OT Visit: 1 Procedure OT Treatments $Self Care/Home Management : 8-22 mins  Malka So 12/21/2014, 11:26 AM   [phone]

## 2014-12-21 NOTE — Progress Notes (Signed)
Physical Therapy Treatment Patient Details Name: Kyle [name] MRN: [id number] DOB: Mar 09, 1951 Today's Date: 12/21/2014    History of Present Illness Pt is a 64 yo male with extensive cardiac history including CHF, R fempop bypass in 2010,aortic insufficiency, HTN, and COPD. 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    PT Comments    Pt very pleasant with greatly improved mobility with today's session. Pt continues to have difficulty maintaining oxygen saturation with sats 88-92% on 4L with mobility. Pt encouraged to continue to increase function with nursing assist as well as to perform HEP at rest. Will continue to follow.    Follow Up Recommendations  Home health PT     Equipment Recommendations       Recommendations for Other Services       Precautions / Restrictions Precautions Precautions: Fall Precaution Comments: on 4L sats 88-92%, watch sats    Mobility  Bed Mobility Overal bed mobility: Needs Assistance Bed Mobility: Rolling;Sidelying to Sit Rolling: Min assist Sidelying to sit: Min assist       General bed mobility comments: cues for sequence with assist to fully rotate trunk and elevate from surface  Transfers Overall transfer level: Needs assistance   Transfers: Sit to/from Stand Sit to Stand: Supervision         General transfer comment: cues for hand placement  Ambulation/Gait Ambulation/Gait assistance: Supervision Ambulation Distance (Feet): 300 Feet Assistive device: Rolling walker (2 wheeled) Gait Pattern/deviations: Step-through pattern   Gait velocity interpretation: Below normal speed for age/gender General Gait Details: cues for posture and upright trunk as well as breathing technique to maintain sats, required 4L maintained with gait   Stairs            Wheelchair Mobility    Modified Rankin (Stroke Patients Only)       Balance Overall balance assessment: Needs assistance   Sitting balance-Leahy Scale: Good        Standing balance-Leahy Scale: Fair                      Cognition Arousal/Alertness: Awake/alert Behavior During Therapy: WFL for tasks assessed/performed Overall Cognitive Status: Within Functional Limits for tasks assessed                      Exercises General Exercises - Lower Extremity Long Arc Quad: AROM;Both;10 reps;Seated Hip Flexion/Marching: AROM;Both;10 reps;Seated    General Comments        Pertinent Vitals/Pain Pain Assessment: No/denies pain    Home Living                      Prior Function            PT Goals (current goals can now be found in the care plan section) Progress towards PT goals: Progressing toward goals    Frequency       PT Plan Current plan remains appropriate    Co-evaluation PT/OT/SLP Co-Evaluation/Treatment: Yes Reason for Co-Treatment: Other (comment) (listed as +2 mod-max assist from prior session but improved mobility within today's session)         End of Session Equipment Utilized During Treatment: Oxygen Activity Tolerance: Patient tolerated treatment well Patient left: in chair;with call bell/phone within reach     Time: 0801-0826 PT Time Calculation (min) (ACUTE ONLY): 25 min  Charges:  $Gait Training: 8-22 mins  G CodesMelford Aase Jan 14, 2015, 11:11 AM Elwyn Reach, [place]

## 2014-12-21 NOTE — Progress Notes (Addendum)
   [facility] [place]  Subjective  - Doing well.  He has passed gas.   Objective 150/70 71 98.5 F (36.9 C) (Oral) 16 100%  Intake/Output Summary (Last 24 hours) at 12/21/14 0807 Last data filed at 12/21/14 0700  Gross per 24 hour  Intake   1295 ml  Output   1700 ml  Net   -405 ml    Left PT doppler signal Incisions clean and dry Abdomin soft positive BS Heart RRR Lungs non labored breathing 2 L [place]   Assessment/Planning: POD # 3 Aortobifemoral bypass Lungs improving  BP stable off Nicardipine Will D/C foley WBC improving now 17.0 down from 18.6 UO 100 cc last hour 40 mg of lasix given daily, 0.45% ns at 50 cc hour Ambulating with PT, plan HH when stable for D/C    Theda Sers [facility] 12/21/2014 8:07 AM --  Laboratory Lab Results:  Recent Labs  12/19/14 0435 12/21/14 0645  WBC 18.6* 17.0*  HGB 14.0 13.1  HCT 40.8 39.0  PLT 153 155   BMET  Recent Labs  12/19/14 0435 12/21/14 0645  NA 134* 133*  K 4.3 3.7  CL 102 99  CO2 26 28  GLUCOSE 194* 140*  BUN 14 23  CREATININE 0.94 0.91  CALCIUM 8.7 8.2*    COAG Lab Results  Component Value Date   INR 1.15 12/18/2014   INR 1.04 12/16/2014   INR 1.1 02/18/2009   No results found for: PTT

## 2014-12-22 LAB — CBC
HCT: 36.6 % — ABNORMAL LOW (ref 39.0–52.0)
HEMOGLOBIN: 12.4 g/dL — AB (ref 13.0–17.0)
MCH: 31 pg (ref 26.0–34.0)
MCHC: 33.9 g/dL (ref 30.0–36.0)
MCV: 91.5 fL (ref 78.0–100.0)
PLATELETS: 167 10*3/uL (ref 150–400)
RBC: 4 MIL/uL — ABNORMAL LOW (ref 4.22–5.81)
RDW: 16.8 % — ABNORMAL HIGH (ref 11.5–15.5)
WBC: 13 10*3/uL — AB (ref 4.0–10.5)

## 2014-12-22 LAB — BASIC METABOLIC PANEL
Anion gap: 6 (ref 5–15)
BUN: 22 mg/dL (ref 6–23)
CALCIUM: 8 mg/dL — AB (ref 8.4–10.5)
CHLORIDE: 99 mmol/L (ref 96–112)
CO2: 27 mmol/L (ref 19–32)
CREATININE: 0.77 mg/dL (ref 0.50–1.35)
GFR calc Af Amer: >90 mL/min (ref >90)
GLUCOSE: 148 mg/dL — AB (ref 70–99)
POTASSIUM: 3.4 mmol/L — AB (ref 3.5–5.1)
SODIUM: 132 mmol/L — AB (ref 135–145)

## 2014-12-22 MED ORDER — PANTOPRAZOLE SODIUM 40 MG IV SOLR
40.0000 mg | INTRAVENOUS | Status: DC
  Administered 2014-12-23: 40 mg via INTRAVENOUS
  Filled 2014-12-22: qty 40

## 2014-12-22 NOTE — Progress Notes (Addendum)
[facility] [place]  Subjective  - Patient alert and oriented.  He has voided independently, tolerating OPO's well.   Objective 155/91 85 97.9 F (36.6 C) (Oral) 20 96%  Intake/Output Summary (Last 24 hours) at 12/22/14 0924 Last data filed at 12/22/14 [id number]  Gross per 24 hour  Intake   1715 ml  Output   1175 ml  Net    540 ml    Abdomin soft, Positive BS, positive BM non bloody. Incisions C/D/I Doppler bilateral PT and right DP biphasic Heart RRR Lungs Non labored breathing on 3L [place]  Assessment/Planning: POD # 4 Aortobifemoral bypass  D/C a line, advance to regular diet, increase mobility Saline lock IV Lovenox DVT prophylaxis   Theda Sers, EMMA [facility] 12/22/2014 9:24 AM -- Continues to make slow progress Wean O2  Continue to ambulate Minimize IV lines Advance diet Transfer 2 W  Possible d/c 2/29  Ruta Hinds, MD [facility] [place]: [phone] Pager: [phone]  Laboratory Lab Results:  Recent Labs  12/21/14 0645 12/22/14 0409  WBC 17.0* 13.0*  HGB 13.1 12.4*  HCT 39.0 36.6*  PLT 155 167   BMET  Recent Labs  12/21/14 0645 12/22/14 0409  NA 133* 132*  K 3.7 3.4*  CL 99 99  CO2 28 27  GLUCOSE 140* 148*  BUN 23 22  CREATININE 0.91 0.77  CALCIUM 8.2* 8.0*    COAG Lab Results  Component Value Date   INR 1.15 12/18/2014   INR 1.04 12/16/2014   INR 1.1 02/18/2009   No results found for: PTT

## 2014-12-22 NOTE — Progress Notes (Signed)
Called to check on patient with Low Sats and patterned throughout the day dropping. Patient found to be 88%on 4L [place]. Patient then coughed and worked with him on IS and cough with splinting and his sats raised to 90% on increase of 5L . Spoke with RN and discussed plan of action to make sure we sit patient up help with Coughing and deep breathing exercising to prevent further decrease in lung atelectasis. Patient did IS with great efforts and 1800 and slow deep breaths.

## 2014-12-22 NOTE — Progress Notes (Signed)
Transfer report received from 2S at 1109 and pt arrived to the unit at 1140; pt A&O x4; denies any pain; IV intact and infusing; pt oriented to the unit and room; call light within reach; telemetry applied and verified; vitals taken; bilateral groin incisions and abd incision clean, dry and intact, open to air with no drainage or discharge noted, approximated. Pt in bed with call light within reach and will continue to monitor quietly. Francis Gaines Meryl Ponder RN.

## 2014-12-23 ENCOUNTER — Inpatient Hospital Stay (HOSPITAL_COMMUNITY): Payer: No Typology Code available for payment source

## 2014-12-23 LAB — CBC
HCT: 36.3 % — ABNORMAL LOW (ref 39.0–52.0)
HEMOGLOBIN: 12.3 g/dL — AB (ref 13.0–17.0)
MCH: 31 pg (ref 26.0–34.0)
MCHC: 33.9 g/dL (ref 30.0–36.0)
MCV: 91.4 fL (ref 78.0–100.0)
Platelets: 177 10*3/uL (ref 150–400)
RBC: 3.97 MIL/uL — ABNORMAL LOW (ref 4.22–5.81)
RDW: 16.6 % — AB (ref 11.5–15.5)
WBC: 10.9 10*3/uL — AB (ref 4.0–10.5)

## 2014-12-23 LAB — BASIC METABOLIC PANEL
ANION GAP: 10 (ref 5–15)
BUN: 12 mg/dL (ref 6–23)
CO2: 30 mmol/L (ref 19–32)
CREATININE: 0.81 mg/dL (ref 0.50–1.35)
Calcium: 8.5 mg/dL (ref 8.4–10.5)
Chloride: 97 mmol/L (ref 96–112)
Glucose, Bld: 145 mg/dL — ABNORMAL HIGH (ref 70–99)
Potassium: 3.8 mmol/L (ref 3.5–5.1)
Sodium: 137 mmol/L (ref 135–145)

## 2014-12-23 LAB — CLOSTRIDIUM DIFFICILE BY PCR: Toxigenic C. Difficile by PCR: NEGATIVE

## 2014-12-23 MED ORDER — FUROSEMIDE 40 MG PO TABS
40.0000 mg | ORAL_TABLET | Freq: Every day | ORAL | Status: DC
  Administered 2014-12-23 – 2014-12-24 (×2): 40 mg via ORAL
  Filled 2014-12-23 (×2): qty 1

## 2014-12-23 MED ORDER — PANTOPRAZOLE SODIUM 40 MG PO TBEC
40.0000 mg | DELAYED_RELEASE_TABLET | Freq: Every day | ORAL | Status: DC
  Administered 2014-12-24: 40 mg via ORAL
  Filled 2014-12-23: qty 1

## 2014-12-23 MED ORDER — POTASSIUM CHLORIDE ER 10 MEQ PO TBCR
40.0000 meq | EXTENDED_RELEASE_TABLET | Freq: Once | ORAL | Status: AC
  Administered 2014-12-23: 40 meq via ORAL
  Filled 2014-12-23: qty 4

## 2014-12-23 MED ORDER — QUINAPRIL HCL 10 MG PO TABS
20.0000 mg | ORAL_TABLET | Freq: Every day | ORAL | Status: DC
  Administered 2014-12-23 – 2014-12-24 (×2): 20 mg via ORAL
  Filled 2014-12-23 (×3): qty 2

## 2014-12-23 MED ORDER — AMLODIPINE BESYLATE 5 MG PO TABS
5.0000 mg | ORAL_TABLET | Freq: Every day | ORAL | Status: DC
  Administered 2014-12-23 – 2014-12-24 (×2): 5 mg via ORAL
  Filled 2014-12-23 (×2): qty 1

## 2014-12-23 MED ORDER — WHITE PETROLATUM GEL
Status: DC | PRN
  Filled 2014-12-23 (×3): qty 28.35

## 2014-12-23 MED ORDER — FUROSEMIDE 10 MG/ML IJ SOLN
40.0000 mg | Freq: Once | INTRAMUSCULAR | Status: AC
  Administered 2014-12-23: 40 mg via INTRAVENOUS

## 2014-12-23 NOTE — Progress Notes (Addendum)
[facility] [place]  Subjective  - Feels better still with low O2 sats   Objective 163/65 71 99.2 F (37.3 C) (Oral) 18 92%  Intake/Output Summary (Last 24 hours) at 12/23/14 0826 Last data filed at 12/22/14 1900  Gross per 24 hour  Intake   1597 ml  Output    975 ml  Net    622 ml   Incisions healing groin abdomen Feet warm  Assessment/Planning: Continue to ambulate today Wean O2 if unable to wean may need home O2 for awhile Will recheck chest xray today Will give extra dose of lasix today Resume home PO amlodipine, lisinopril lasix  Kyle [name],Kyle [name] 12/23/2014 8:26 AM --  Laboratory Lab Results:  Recent Labs  12/22/14 0409 12/23/14 0435  WBC 13.0* 10.9*  HGB 12.4* 12.3*  HCT 36.6* 36.3*  PLT 167 177   BMET  Recent Labs  12/22/14 0409 12/23/14 0435  NA 132* 137  K 3.4* 3.8  CL 99 97  CO2 27 30  GLUCOSE 148* 145*  BUN 22 12  CREATININE 0.77 0.81  CALCIUM 8.0* 8.5    COAG Lab Results  Component Value Date   INR 1.15 12/18/2014   INR 1.04 12/16/2014   INR 1.1 02/18/2009   No results found for: PTT

## 2014-12-24 ENCOUNTER — Telehealth: Payer: Self-pay | Admitting: Vascular Surgery

## 2014-12-24 LAB — BASIC METABOLIC PANEL
Anion gap: 5 (ref 5–15)
BUN: 12 mg/dL (ref 6–23)
CO2: 33 mmol/L — ABNORMAL HIGH (ref 19–32)
CREATININE: 0.76 mg/dL (ref 0.50–1.35)
Calcium: 8.9 mg/dL (ref 8.4–10.5)
Chloride: 100 mmol/L (ref 96–112)
GFR calc non Af Amer: >90 mL/min (ref >90)
Glucose, Bld: 135 mg/dL — ABNORMAL HIGH (ref 70–99)
Potassium: 3.7 mmol/L (ref 3.5–5.1)
Sodium: 138 mmol/L (ref 135–145)

## 2014-12-24 LAB — CBC
HCT: 38.4 % — ABNORMAL LOW (ref 39.0–52.0)
Hemoglobin: 12.8 g/dL — ABNORMAL LOW (ref 13.0–17.0)
MCH: 30.3 pg (ref 26.0–34.0)
MCHC: 33.3 g/dL (ref 30.0–36.0)
MCV: 91 fL (ref 78.0–100.0)
Platelets: 230 10*3/uL (ref 150–400)
RBC: 4.22 MIL/uL (ref 4.22–5.81)
RDW: 16.5 % — ABNORMAL HIGH (ref 11.5–15.5)
WBC: 9 10*3/uL (ref 4.0–10.5)

## 2014-12-24 MED ORDER — OXYCODONE-ACETAMINOPHEN 5-325 MG PO TABS: 1.0000 | ORAL_TABLET | ORAL | Status: DC | PRN

## 2014-12-24 NOTE — Progress Notes (Addendum)
SATURATION QUALIFICATIONS: (This note is used to comply with regulatory documentation for home oxygen)  Patient Saturations on Room Air at Rest = 87%  Patient Saturations on Room Air while Ambulating = 87%  Patient Saturations on 6 Liters of oxygen while Ambulating = 95%  Please briefly explain why patient needs home oxygen: Pt desats below 87  without oxygen.  At rest pt on 4 liters of oxygen, needs to increase to 6 liters with ambulation.

## 2014-12-24 NOTE — Telephone Encounter (Signed)
Spoke with pts wife to schedule follow up, dpm

## 2014-12-24 NOTE — Progress Notes (Signed)
Occupational Therapy Treatment Patient Details Name: Kyle [name] MRN: [id number] DOB: [birth year] Today's Date: 12/24/2014    History of present illness Pt is a 64 yo male with extensive cardiac history including CHF, R fempop bypass in 2010,aortic insufficiency, HTN, and COPD. 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   OT comments  Pt. States abdomen pain improving everyday.  Is MIN GUARD A for ADLS in standing and short distance ambulation in room with intermittent furniture walking.  Cues for o2 tube management but overall doing very well.    Follow Up Recommendations  Home health OT;Supervision/Assistance - 24 hour    Equipment Recommendations  None recommended by OT    Recommendations for Other Services      Precautions / Restrictions Precautions Precautions: Fall Precaution Comments: on 4L sats 88-92%, watch sats       Mobility Bed Mobility               General bed mobility comments: in recliner upon arrival  Transfers Overall transfer level: Needs assistance Equipment used: None Transfers: Sit to/from Stand;Stand Pivot Transfers Sit to Stand: Min guard Stand pivot transfers: Min guard       General transfer comment: cues for hand placement, and o2 line management. uses intermittent furniture for UE support but does not want to use a walker only "for long walks" he stated    Balance     Sitting balance-Leahy Scale: Good       Standing balance-Leahy Scale: Fair                     ADL Overall ADL's : Needs assistance/impaired Eating/Feeding: Sitting;Set up   Grooming: Wash/dry hands;Wash/dry face;Min guard;Standing               Lower Body Dressing: Sitting/lateral leans;Modified independent   Toilet Transfer: Magazine features editor Details (indicate cue type and reason): simulated, amb. from recliner to sink back to recliner, intermittent furniture walking with cues for o2 tube management Toileting- Clothing Manipulation and  Hygiene: Min guard;Sit to/from stand Toileting - Clothing Manipulation Details (indicate cue type and reason): simulated during ambulation and transfers in the room       General ADL Comments: states pain much improved, able to complete transfers in room and grooming tasks at sink      Vision                     Perception     Praxis      Cognition   Behavior During Therapy: [facility] for tasks assessed/performed Overall Cognitive Status: Within Functional Limits for tasks assessed                       Extremity/Trunk Assessment               Exercises     Shoulder Instructions       General Comments      Pertinent Vitals/ Pain       Pain Assessment:  (did not rate, states initial abdomen pain with sit/stand but not significant)  Home Living                                          Prior Functioning/Environment              Frequency Min 2X/week     Progress  Toward Goals  OT Goals(current goals can now be found in the care plan section)  Progress towards OT goals: Progressing toward goals     Plan Discharge plan remains appropriate    Co-evaluation                 End of Session Equipment Utilized During Treatment: Oxygen   Activity Tolerance Patient tolerated treatment well   Patient Left in chair;with call bell/phone within reach   Nurse Communication          Time: [id number] OT Time Calculation (min): 13 min  Charges: OT General Charges $OT Visit: 1 Procedure OT Treatments $Self Care/Home Management : 8-22 mins  Janice Coffin, COTA/L 12/24/2014, 7:50 AM

## 2014-12-24 NOTE — Progress Notes (Signed)
[facility] [place]  Subjective  - wants to go home   Objective 148/66 62 98.5 F (36.9 C) (Oral) 16 98%  Intake/Output Summary (Last 24 hours) at 12/24/14 1010 Last data filed at 12/23/14 1451  Gross per 24 hour  Intake    240 ml  Output    525 ml  Net   -285 ml   Chest: CTA Incisions abdomen and groin healing Bilateral feet warm  Assessment/Planning: D/c home today  Home O2  Pulm rehab Follow up Scot Dock in a few weeks   Syrina Wake E 12/24/2014 10:10 AM --  Laboratory Lab Results:  Recent Labs  12/23/14 0435 12/24/14 0436  WBC 10.9* 9.0  HGB 12.3* 12.8*  HCT 36.3* 38.4*  PLT 177 230   BMET  Recent Labs  12/23/14 0435 12/24/14 0436  NA 137 138  K 3.8 3.7  CL 97 100  CO2 30 33*  GLUCOSE 145* 135*  BUN 12 12  CREATININE 0.81 0.76  CALCIUM 8.5 8.9    COAG Lab Results  Component Value Date   INR 1.15 12/18/2014   INR 1.04 12/16/2014   INR 1.1 02/18/2009   No results found for: PTT

## 2014-12-24 NOTE — Progress Notes (Signed)
[facility] [place]  Subjective  - Ready to go home.   Objective 148/66 62 98.5 F (36.9 C) (Oral) 16 98%  Intake/Output Summary (Last 24 hours) at 12/24/14 0949 Last data filed at 12/23/14 1451  Gross per 24 hour  Intake    240 ml  Output    525 ml  Net   -285 ml   Abdomin soft non tender, Incision healing well Groin soft incisions healing well Feet war, well perfused, active range of motion and sensation intact Lungs 4L O2 desat to 88 when on 4 L with activty     Assessment/Planning: POD # Aortobifemoral bypass  D/C home with 4 L O2, PT/OT, pulmonary rehab   Laurence Slate [facility] 12/24/2014 9:49 AM --  Laboratory Lab Results:  Recent Labs  12/23/14 0435 12/24/14 0436  WBC 10.9* 9.0  HGB 12.3* 12.8*  HCT 36.3* 38.4*  PLT 177 230   BMET  Recent Labs  12/23/14 0435 12/24/14 0436  NA 137 138  K 3.8 3.7  CL 97 100  CO2 30 33*  GLUCOSE 145* 135*  BUN 12 12  CREATININE 0.81 0.76  CALCIUM 8.5 8.9    COAG Lab Results  Component Value Date   INR 1.15 12/18/2014   INR 1.04 12/16/2014   INR 1.1 02/18/2009   No results found for: PTT

## 2014-12-24 NOTE — Telephone Encounter (Signed)
-----   Message from Mena Goes, RN sent at 12/24/2014  9:54 AM EST ----- Regarding: Schedule   ----- Message -----    From: Ulyses Amor, PA-C    Sent: 12/24/2014   9:47 AM      To: Vvs Charge Pool  F/U in 2 weeks with [name] s/p aortobifemoral bypass

## 2014-12-24 NOTE — Progress Notes (Signed)
CARE MANAGEMENT NOTE 12/24/2014  Patient:  DILLINGER, ASTON   Account Number:  [id number]  Date Initiated:  12/19/2014  Documentation initiated by:  MAYO,HENRIETTA  Subjective/Objective Assessment:   s/p ortobifemoral BPG/endarterectomy; lives with spouse     Action/Plan:   Anticipated DC Date:  12/24/2014   Anticipated DC Plan:  [place]  CM consult      [place]   Choice offered to / List presented to:  C-3 Spouse   DME arranged  [place]      DME agency  [place] arranged  [place] - 11 Patient Refused      Status of service:  Completed, signed off Medicare Important Message given?  YES (If response is "NO", the following Medicare IM given date fields will be blank) Date Medicare IM given:  12/20/2014 Medicare IM given by:  MAYO,HENRIETTA Date Additional Medicare IM given:   Additional Medicare IM given by:    Discharge Disposition:  HOME/SELF CARE  Per UR Regulation:  Reviewed for med. necessity/level of care/duration of stay  If discussed at [place] of Stay Meetings, dates discussed:   12/20/2014    Comments:  12/24/14- 1130- Marvetta Gibbons RN< BSN [phone] Pt for d/c home today, will need home 02 and has orders for [facility]- spoke with pt and wife- who are agreeable to home 02 but still state that they do not want any HH services- pt and wife are agreeable to [facility] providing home 02- discussed orders for [facility]- but again pt and wife politely decline any referral for [facility] stating that they just can't afford the copay cost for the services. Call made to [facility] with [facility] for 02 needs- tank to be delivered to room prior to discharge- no HH referral made-- pt reports that his PCP is at [facility].  12/21/14 [address] MSN BSN CCM Met again with pt and discussed home health PT/OT services. Pt again refuses, states he has had home therapy before and  does not need therapy services.  12/20/14 [name] MSN BSN CCM Pt states spouse will be present 24/7 and can provide assistance as needed.  Pt has rolling walker and other equipment that belonged to his sister.  Discussed home health therapy vs rehab @ SNF - pt refuses to consider either option, states he will return home and will not need any further therapy.

## 2014-12-24 NOTE — Progress Notes (Signed)
Received order to c/s for outpatient pulmonary rehab. Discussed program with pt. Pt reluctant at first but agreed since he is going home on O2. Will send referral. Yves Dill CES, ACSM 9:42 AM 12/24/2014

## 2014-12-24 NOTE — Progress Notes (Signed)
Pt/family given discharge instructions, medication lists, follow up appointments, and when to call the doctor.  Pt/family verbalizes understanding. Pt given signs and symptoms of infection. Jama Krichbaum McClint[place], RN    

## 2014-12-24 NOTE — Progress Notes (Signed)
Physical Therapy Treatment Patient Details Name: Kyle [name] MRN: [id number] DOB: February 22, 1951 Today's Date: 12/24/2014    History of Present Illness Pt is a 64 yo male with extensive cardiac history including CHF, R fempop bypass in 2010,aortic insufficiency, HTN, and COPD. 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    PT Comments    Pt continues to have increased gait, mobility, balance and function. Pt with only minimal need for support with gait and able to tolerate hall ambulation and stairs. Pt encouraged to work on IS and practice deep breathing as well as increase mobility with nursing. Will continue to follow.   Follow Up Recommendations  Home health PT     Equipment Recommendations  Rolling walker with 5" wheels    Recommendations for Other Services       Precautions / Restrictions Precautions Precautions: Fall Precaution Comments: watch sats Restrictions Weight Bearing Restrictions: No    Mobility  Bed Mobility               General bed mobility comments: in recliner upon arrival  Transfers Overall transfer level: Modified independent Equipment used: None Transfers: Sit to/from [employer] Transfers Sit to Stand: Min guard Stand pivot transfers: Min guard       General transfer comment: no cues needed for transfers today  Ambulation/Gait Ambulation/Gait assistance: Min guard Ambulation Distance (Feet): 350 Feet Assistive device: 1 person hand held assist Gait Pattern/deviations: Step-through pattern;Decreased stride length   Gait velocity interpretation: Below normal speed for age/gender General Gait Details: 3 standing rest breaks for oxygen saturation, pt with slight unsteady gait requiring HHA for stability. Pt on 6L throughout gait with sats 85-98%, no SOB throughout per pt. Pt 98% on 4L at rest   Stairs Stairs: Yes Stairs assistance: Modified independent (Device/Increase time) Stair Management: One rail Right;Alternating  pattern;Forwards Number of Stairs: 11    Wheelchair Mobility    Modified Rankin (Stroke Patients Only)       Balance     Sitting balance-Leahy Scale: Good       Standing balance-Leahy Scale: Fair                      Cognition Arousal/Alertness: Awake/alert Behavior During Therapy: WFL for tasks assessed/performed Overall Cognitive Status: Within Functional Limits for tasks assessed                      Exercises      General Comments        Pertinent Vitals/Pain Pain Assessment: 0-10 Pain Score: 3  Pain Location: sore abdomen with deep breaths or stretching Pain Intervention(s): Repositioned    Home Living                      Prior Function            PT Goals (current goals can now be found in the care plan section) Progress towards PT goals: Progressing toward goals    Frequency       PT Plan Current plan remains appropriate    Co-evaluation             End of Session Equipment Utilized During Treatment: Oxygen Activity Tolerance: Patient tolerated treatment well Patient left: Other (comment);with call bell/phone within reach;with family/visitor present (in bathroom)     Time: [id number] PT Time Calculation (min) (ACUTE ONLY): 25 min  Charges:  $Gait Training: 23-37 mins  G CodesMelford Aase 01/17/15, 11:05 AM Elwyn Reach, [place]

## 2014-12-26 NOTE — Discharge Summary (Signed)
[facility] Discharge Summary   Patient ID:  Kyle [name] MRN: [id number] DOB/AGE: 63-Apr-1952 63 y.o.  Admit date: 12/16/2014 Discharge date: 12/26/2014 Date of Surgery: 12/16/2014 - 12/18/2014 Surgeon: Surgeon(s): Angelia Mould, MD Conrad [place], MD  Admission Diagnosis: Aortoiliac occlusive disease Ischemic left lower extremity M62.89  Discharge Diagnoses:  Aortoiliac occlusive disease Ischemic left lower extremity M62.89  Secondary Diagnoses: Past Medical History  Diagnosis Date  . Aortic insufficiency     MODERATE WITH A BICUSPID AORTIC VAVLE  . Hypertension   . Dilated cardiomyopathy     WITH EJECTION FRACTION DOWN TO 20-25%--WITH CONGESTIVE HEART FAILURE  . CHF (congestive heart failure)   . SOB (shortness of breath)   . Orthopnea   . Edema     LOWER EXTREMETIES  . COPD (chronic obstructive pulmonary disease)   . Arterial occlusive disease     MULTILEVEL  . Peripheral arterial disease   . Pulmonary hypertension   . Normal coronary arteries 2009    Procedure(s): AORTOBIFEMORAL BYPASS GRAFT ENDARTERECTOMY FEMORAL  Discharged Condition: good  HPI: Kyle [name] is a 64 y.o. male who I performed a right femoropopliteal bypass graft on in 2010.Marland Kitchen He was seen by Dr. Peter Martinique on 08/21/2014. He had acute systolic congestive heart failureand this was managed by Dr. Martinique. His blood pressure was not well controlled at that visit. He's had a previous right femoropopliteal bypass graft and he had Doppler studies done for follow up. This showed that he had good perfusion on the right side but on the left side he had markedly decreased perfusion and was sent for vascular consultation.  The patient complains of rest pain in the left foot which it and going on for several months. He also specifically describes pain in his left third fourth and fifth toes. He denies any history of nonhealing ulcers. He denies claudication symptoms in the right lower  extremity. He is not a smoker.   Hospital Course:  Kyle [name] is a 64 y.o. male is S/P  Procedure(s): AORTOBIFEMORAL BYPASS GRAFT ENDARTERECTOMY FEMORAL POD# 1 Patient reported to have episode of de-saturation and hypertension. IV fluids were stopped, Nicardipine was started " double strength," Vent mask at 55 to increase O2. He is stable, alert and talking fine.  BP 128/59, SAT 95  POD # 2 Aortobifemoral bypass D/C NG tube zero output Tm 100.8, labs pending PO meds with sip of water otherwise npo until flatus or BM Ventimask D/C'd now on 5L O2 [place] Transfer to step down later today POD # 3 Aortobifemoral bypass Lungs improving  BP stable off Nicardipine Will D/C foley WBC improving now 17.0 down from 18.6 UO 100 cc last hour 40 mg of lasix given daily, 0.45% ns at 50 cc hour Ambulating with PT, plan HH when stable for D/C POD # 4 Aortobifemoral bypass O2 [place] 4L continues to de-sat with activity in the high 80's D/C a line, advance to regular diet, increase mobility Saline lock IV Lovenox DVT prophylaxis POD#5 Resume home PO amlodipine, lisinopril lasix O2 4L [place], Chest xray Mild bibasilar subsegmental atelectasis which is slightly improved compared to prior exam. POD#6 D/c home today  Home O2  Pulm rehab Follow up Scot Dock in a few weeks  Home health PT/OT/respiratory    Consults:  Treatment Team:  Angelia Mould, MD Elam Dutch, MD  Significant Diagnostic Studies: CBC Lab Results  Component Value Date   WBC 9.0 12/24/2014   HGB 12.8* 12/24/2014  HCT 38.4* 12/24/2014   MCV 91.0 12/24/2014   PLT 230 12/24/2014    BMET    Component Value Date/Time   NA 138 12/24/2014 0436   K 3.7 12/24/2014 0436   CL 100 12/24/2014 0436   CO2 33* 12/24/2014 0436   GLUCOSE 135* 12/24/2014 0436   BUN 12 12/24/2014 0436   CREATININE 0.76 12/24/2014 0436   CREATININE 0.99 11/12/2014 1344   CALCIUM 8.9 12/24/2014 0436   GFRNONAA >90 12/24/2014 0436    GFRAA >90 12/24/2014 0436   COAG Lab Results  Component Value Date   INR 1.15 12/18/2014   INR 1.04 12/16/2014   INR 1.1 02/18/2009     Disposition:  Discharge to :Home Discharge Instructions    Call MD for:  redness, tenderness, or signs of infection (pain, swelling, bleeding, redness, odor or green/yellow discharge around incision site)    Complete by:  As directed      Call MD for:  severe or increased pain, loss or decreased feeling  in affected limb(s)    Complete by:  As directed      Call MD for:  temperature >100.5    Complete by:  As directed      Discharge instructions    Complete by:  As directed   May shower daily, keep groins clean and dry after showering     Driving Restrictions    Complete by:  As directed   No driving for 2 weeks     Increase activity slowly    Complete by:  As directed   Walk with assistance use walker or cane as needed     Lifting restrictions    Complete by:  As directed   No lifting for 8 weeks     Resume previous diet    Complete by:  As directed             Medication List    TAKE these medications        amLODipine 5 MG tablet  Commonly known as:  NORVASC  Take 1 tablet (5 mg total) by mouth daily.     aspirin 81 MG chewable tablet  Chew 81 mg by mouth daily.     carvedilol 12.5 MG tablet  Commonly known as:  COREG  Take 1 tablet (12.5 mg total) by mouth 2 (two) times daily with a meal.     furosemide 40 MG tablet  Commonly known as:  LASIX  Take 1 tablet (40 mg total) by mouth daily.     oxyCODONE-acetaminophen 5-325 MG per tablet  Commonly known as:  PERCOCET/ROXICET  Take 1-2 tablets by mouth every 4 (four) hours as needed for moderate pain.     pravastatin 40 MG tablet  Commonly known as:  PRAVACHOL  Take 1 tablet every night     quinapril 20 MG tablet  Commonly known as:  ACCUPRIL  Take 1 tablet (20 mg total) by mouth at bedtime.       Verbal and written Discharge instructions given to the patient.  Wound care per Discharge AVS     Follow-up Information    Follow up with DICKSON,CHRISTOPHER S, MD In 2 weeks.   Specialty:  Vascular Surgery   Why:  sent message to office   Contact information:   [address] [place] [address] [phone]       Follow up with [place].   Why:  home healh oxygen arranged, to be delivered to  patient's room    Contact information:   [place] [address] [phone]       Signed: Laurence Slate [facility] 12/26/2014, 3:53 PM  - For [facility] Registry use --- Instructions: Press F2 to tab through selections.  Delete question if not applicable.   Post-op:  Time to Extubation: [ ]  In OR, [ ]  < 12 hrs, [ ]  12-24 hrs, [ ]  >=24 hrs Vasopressors Req. Post-op: No ICU Stay: 3 days Transfusion: No  If yes, 0 units given MI: [x ] No, [ ]  Troponin only, [ ]  EKG or Clinical New Arrhythmia: No  Complications: CHF: Yes Resp failure: [ ]  none, [ ]  Pneumonia, [ ]  Ventilator [x]  hypoxemia D/c on home oxygen Chg in renal function: [x ] none, [ ]  Inc. Cr > 0.5, [ ]  Temp. Dialysis, [ ]  Permanent dialysis Leg ischemia: [x ] No, [ ]  Yes, no Surgery needed, [ ]  Yes, Surgery needed, [ ]  Amputation Bowel ischemia: [x ] No, [ ]  Medical Rx, [ ]  Surgical Rx Wound complication: [x ] No, [ ]  Superficial separation/infection, [ ]  Return to OR Return to OR: No  Return to OR for bleeding: No Stroke: [ ]  None, [ ]  Minor, [ ]  Major  Discharge medications: Statin use:  Yes ASA use:  Yes Plavix use:  No  for medical reason   Beta blocker use: Yes

## 2014-12-28 DIAGNOSIS — Z0279 Encounter for issue of other medical certificate: Secondary | ICD-10-CM

## 2015-01-08 ENCOUNTER — Encounter: Payer: Self-pay | Admitting: Vascular Surgery

## 2015-01-09 ENCOUNTER — Ambulatory Visit (INDEPENDENT_AMBULATORY_CARE_PROVIDER_SITE_OTHER): Payer: Self-pay | Admitting: Vascular Surgery

## 2015-01-09 ENCOUNTER — Encounter: Payer: Self-pay | Admitting: *Deleted

## 2015-01-09 ENCOUNTER — Encounter: Payer: Self-pay | Admitting: Vascular Surgery

## 2015-01-09 VITALS — BP 117/56 | HR 78 | Ht 71.0 in | Wt 135.4 lb

## 2015-01-09 DIAGNOSIS — Z48812 Encounter for surgical aftercare following surgery on the circulatory system: Secondary | ICD-10-CM

## 2015-01-09 NOTE — Progress Notes (Signed)
  POST OPERATIVE OFFICE NOTE    CC:  F/u for surgery  HPI:  This is a 64 y.o. male who is s/p Aortobifemoral bypass with left femoarl artery endarterectomy.  He is doing well over all.He was sent home on O2 vi [place] and is now off of it.  He states he can't eat very much and feels like he is slow to digest food.  He has had to occasionally take a laxative.  He reports no back or abdominal pain.  He also states his left foot feels significantly better.    No Known Allergies  Current Outpatient Prescriptions  Medication Sig Dispense Refill  . amLODipine (NORVASC) 5 MG tablet Take 1 tablet (5 mg total) by mouth daily. 30 tablet 6  . aspirin 81 MG chewable tablet Chew 81 mg by mouth daily.      . carvedilol (COREG) 12.5 MG tablet Take 1 tablet (12.5 mg total) by mouth 2 (two) times daily with a meal. 60 tablet 6  . furosemide (LASIX) 40 MG tablet Take 1 tablet (40 mg total) by mouth daily. 30 tablet 11  . pravastatin (PRAVACHOL) 40 MG tablet Take 1 tablet every night 30 tablet 6  . quinapril (ACCUPRIL) 20 MG tablet Take 1 tablet (20 mg total) by mouth at bedtime. (Patient taking differently: Take 20 mg by mouth daily. ) 90 tablet 3  . oxyCODONE-acetaminophen (PERCOCET/ROXICET) 5-325 MG per tablet Take 1-2 tablets by mouth every 4 (four) hours as needed for moderate pain. (Patient not taking: Reported on 01/09/2015) 30 tablet 0   No current facility-administered medications for this visit.     ROS:  See HPI  Physical Exam:  Filed Vitals:   01/09/15 1519  BP: 117/56  Pulse: 78    Incision:  Well healed and abdomin is soft with positive BS Extremities:  Palpable femoral pulses bilateral as well as PT right, doppler PT on the left LE Lungs CTA bil. Non labored breathing Heart RRR Assessment/Plan:  This is a 64 y.o. male who is s/p: Aortobifemoral bypass with left femoarl artery endarterectomy.  He will continue to increase his food intake as he tolerates Discontinue the O2 [place] a  walking program.  He want to start a weight lifting program and was instructed to wait until at least 6 weeks out to prevent a hernia formation.   We will see him back in 6 months for ABI's he was given a return to work note for April 19 th 2016.    Theda Sers Josemiguel Gries [facility] PA-C [facility] [phone]  Clinic MD:  Pt seen and examined with [name]

## 2015-01-10 ENCOUNTER — Other Ambulatory Visit: Payer: Self-pay | Admitting: *Deleted

## 2015-01-10 DIAGNOSIS — I739 Peripheral vascular disease, unspecified: Secondary | ICD-10-CM

## 2015-01-14 ENCOUNTER — Encounter: Payer: Self-pay | Admitting: *Deleted

## 2015-01-17 ENCOUNTER — Telehealth: Payer: Self-pay | Admitting: Cardiology

## 2015-01-17 NOTE — Telephone Encounter (Signed)
She have sent over an order for Dr Martinique to sign,she sent it twice. Please do this asap,so pt can start Rehab.

## 2015-01-17 NOTE — Telephone Encounter (Signed)
Returned call to [place] at Cardiac Rehab spoke to [place] advised I just received cardiac rehab form today.I will get Dr.Jordan's signature today and fax back.

## 2015-01-18 NOTE — Telephone Encounter (Signed)
Pulmonary Rehab order faxed to pulmonary rehab at fax # [phone].

## 2015-01-25 ENCOUNTER — Telehealth (HOSPITAL_COMMUNITY): Payer: Self-pay

## 2015-01-25 NOTE — Telephone Encounter (Signed)
Called patient to discuss pulmonary rehab per Dr. Harmon Pier referral.  Patient states that he is not interested in attending Pulmonary Rehab because he states "he is healing up pretty good."  I encouraged the patient to contact us in the future if he changes his mind.

## 2015-02-06 ENCOUNTER — Encounter (HOSPITAL_COMMUNITY): Payer: No Typology Code available for payment source

## 2015-02-06 ENCOUNTER — Ambulatory Visit: Payer: No Typology Code available for payment source | Admitting: Vascular Surgery

## 2015-07-15 ENCOUNTER — Encounter: Payer: Self-pay | Admitting: Vascular Surgery

## 2015-07-17 ENCOUNTER — Ambulatory Visit: Payer: No Typology Code available for payment source | Admitting: Vascular Surgery

## 2015-07-17 ENCOUNTER — Ambulatory Visit (HOSPITAL_COMMUNITY): Payer: No Typology Code available for payment source

## 2015-10-11 ENCOUNTER — Other Ambulatory Visit: Payer: Self-pay | Admitting: Cardiology

## 2015-10-27 ENCOUNTER — Other Ambulatory Visit: Payer: Self-pay | Admitting: Cardiology

## 2015-10-29 NOTE — Telephone Encounter (Signed)
Rx request sent to pharmacy.  

## 2015-10-31 ENCOUNTER — Other Ambulatory Visit: Payer: Self-pay | Admitting: Cardiology

## 2015-11-15 ENCOUNTER — Telehealth: Payer: Self-pay | Admitting: Cardiology

## 2015-11-15 NOTE — Telephone Encounter (Signed)
Close encounter 

## 2015-12-03 ENCOUNTER — Other Ambulatory Visit: Payer: Self-pay | Admitting: Cardiology

## 2015-12-04 NOTE — Telephone Encounter (Signed)
Rx(s) sent to pharmacy electronically.  

## 2015-12-05 ENCOUNTER — Other Ambulatory Visit: Payer: Self-pay | Admitting: Cardiology

## 2015-12-05 NOTE — Telephone Encounter (Signed)
Rx(s) sent to pharmacy electronically.  

## 2015-12-24 ENCOUNTER — Encounter: Payer: Self-pay | Admitting: Vascular Surgery

## 2016-01-01 ENCOUNTER — Encounter: Payer: Self-pay | Admitting: Vascular Surgery

## 2016-01-01 ENCOUNTER — Ambulatory Visit (INDEPENDENT_AMBULATORY_CARE_PROVIDER_SITE_OTHER): Payer: BLUE CROSS/BLUE SHIELD | Admitting: Vascular Surgery

## 2016-01-01 ENCOUNTER — Ambulatory Visit (HOSPITAL_COMMUNITY)
Admission: RE | Admit: 2016-01-01 | Discharge: 2016-01-01 | Disposition: A | Discharge status: Home / Self Care | Payer: BLUE CROSS/BLUE SHIELD | Source: Ambulatory Visit | Attending: Vascular Surgery | Admitting: Vascular Surgery

## 2016-01-01 VITALS — BP 147/77 | HR 73 | Temp 98.8°F | Resp 18 | Ht 70.0 in | Wt 154.0 lb

## 2016-01-01 DIAGNOSIS — I110 Hypertensive heart disease with heart failure: Secondary | ICD-10-CM | POA: Diagnosis not present

## 2016-01-01 DIAGNOSIS — I509 Heart failure, unspecified: Secondary | ICD-10-CM | POA: Insufficient documentation

## 2016-01-01 DIAGNOSIS — I739 Peripheral vascular disease, unspecified: Secondary | ICD-10-CM | POA: Diagnosis not present

## 2016-01-01 DIAGNOSIS — I350 Nonrheumatic aortic (valve) stenosis: Secondary | ICD-10-CM | POA: Diagnosis not present

## 2016-01-01 NOTE — Progress Notes (Signed)
History of Present Illness:  Patient is a 65 y.o. year old male who presents for evaluation of Aortobifemoral bypass with left femoarl artery endarterectomy.12/18/2014 and right fem-pop 10/24/2104.  He is doing well with increased activity and a full time job.  He has had a increased appetite and no back or abdominal pain.  Other medical problems include has Aortic insufficiency; Hypertension; Peripheral arterial disease ([place]); Dilated cardiomyopathy ([place]); Acute on chronic systolic CHF (congestive heart failure) ([place]); Atherosclerosis of native arteries of extremity with intermittent claudication ([place]); Aortic stenosis; Hypertensive cardiovascular disease; Pulmonary hypertension ([place]); Dyslipidemia; PVD (peripheral vascular disease) ([place]); Aortoiliac occlusive disease ([place]); and Protein-calorie malnutrition, severe ([place]) on his problem list.  Past Medical History  Diagnosis Date  . Aortic insufficiency     MODERATE WITH A BICUSPID AORTIC VAVLE  . Hypertension   . Dilated cardiomyopathy (HCC)     WITH EJECTION FRACTION DOWN TO 20-25%--WITH CONGESTIVE HEART FAILURE  . CHF (congestive heart failure) ([place])   . SOB (shortness of breath)   . Orthopnea   . Edema     LOWER EXTREMETIES  . COPD (chronic obstructive pulmonary disease) ([place])   . Arterial occlusive disease ([place])     MULTILEVEL  . Peripheral arterial disease ([place])   . Pulmonary hypertension ([place])   . Normal coronary arteries 2009    Past Surgical History  Procedure Laterality Date  . Other surgical history      POST RIGHT FEMOROPOPLITEAL BYPASS GRAFT  . Colonoscopy    . Cardiac catheterization  2009    Nl Cors, EF 20%  . Femoral-popliteal bypass graft  02/21/2011    right  . Abdominal aortagram N/A 11/05/2014    Procedure: ABDOMINAL Maxcine Ham;  Surgeon: Angelia Mould, MD;  Location: [facility] CATH LAB;  Service: Cardiovascular;  Laterality: N/A;  . Peripheral vascular catheterization  11/05/2014    Procedure: LOWER  EXTREMITY ANGIOGRAPHY;  Surgeon: Angelia Mould, MD;  Location: [facility] CATH LAB;  Service: Cardiovascular;;  . Aorta - bilateral femoral artery bypass graft N/A 12/18/2014    Procedure: AORTOBIFEMORAL BYPASS GRAFT;  Surgeon: Angelia Mould, MD;  Location: [facility];  Service: Vascular;  Laterality: N/A;  . Endarterectomy femoral Left 12/18/2014    Procedure: ENDARTERECTOMY FEMORAL;  Surgeon: Angelia Mould, MD;  Location: [facility] OR;  Service: Vascular;  Laterality: Left;    Social History Social History  Substance Use Topics  . Smoking status: Former Smoker -- 1.00 packs/day  . Smokeless tobacco: Never Used     Comment: quit 2010  . Alcohol Use: No    Family History Family History  Problem Relation Age of Onset  . Diabetes Mother   . Hyperlipidemia Sister   . Hypertension Sister     Allergies  No Known Allergies   Current Outpatient Prescriptions  Medication Sig Dispense Refill  . amLODipine (NORVASC) 5 MG tablet Take 1 tablet (5 mg total) by mouth daily. PLEASE KEEP UPCOMING APPOINTMENT 30 tablet 2  . aspirin 81 MG chewable tablet Chew 81 mg by mouth daily.      . carvedilol (COREG) 12.5 MG tablet Take 1 tablet (12.5 mg total) by mouth 2 (two) times daily with a meal. 60 tablet 6  . carvedilol (COREG) 12.5 MG tablet TAKE ONE TABLET BY MOUTH TWICE DAILY WITH A MEAL 60 tablet 1  . furosemide (LASIX) 40 MG tablet TAKE ONE TABLET BY MOUTH ONCE DAILY. 30 tablet 2  . pravastatin (PRAVACHOL) 40 MG tablet Take 1  tablet (40 mg total) by mouth at bedtime. PLEASE KEEP UPCOMING APPOINTMENT 30 tablet 2  . quinapril (ACCUPRIL) 10 MG tablet TAKE ONE TABLET BY MOUTH ONCE DAILY 30 tablet 1  . quinapril (ACCUPRIL) 20 MG tablet Take 1 tablet (20 mg total) by mouth at bedtime. (Patient taking differently: Take 20 mg by mouth daily. ) 90 tablet 3  . oxyCODONE-acetaminophen (PERCOCET/ROXICET) 5-325 MG per tablet Take 1-2 tablets by mouth every 4 (four) hours as needed for moderate pain.  (Patient not taking: Reported on 01/09/2015) 30 tablet 0   No current facility-administered medications for this visit.    ROS:   General:  No weight loss, Fever, chills  HEENT: No recent headaches, no nasal bleeding, no visual changes, no sore throat  Neurologic: No dizziness, blackouts, seizures. No recent symptoms of stroke or mini- stroke. No recent episodes of slurred speech, or temporary blindness.  Cardiac: No recent episodes of chest pain/pressure, no shortness of breath at rest.  No shortness of breath with exertion.  Denies history of atrial fibrillation or irregular heartbeat  Vascular: No history of rest pain in feet.  No history of claudication.  No history of non-healing ulcer, No history of DVT   Pulmonary: No home oxygen, no productive cough, no hemoptysis,  No asthma or wheezing  Musculoskeletal:  [ ]  Arthritis, [ ]  Low back pain,  [ ]  Joint pain  Hematologic:No history of hypercoagulable state.  No history of easy bleeding.  No history of anemia  Gastrointestinal: No hematochezia or melena,  No gastroesophageal reflux, no trouble swallowing  Urinary: [ ]  chronic Kidney disease, [ ]  on HD - [ ]  MWF or [ ]  TTHS, [ ]  Burning with urination, [ ]  Frequent urination, [ ]  Difficulty urinating;   Skin: No rashes  Psychological: No history of anxiety,  No history of depression   Physical Examination  Filed Vitals:   01/01/16 1508 01/01/16 1513  BP: 153/73 147/77  Pulse: 73 73  Temp: 98.8 F (37.1 C)   Resp: 18   Height: 5\' 10"  (1.778 m)   Weight: 154 lb (69.854 kg)   SpO2: 90%     Body mass index is 22.1 kg/(m^2).  General:  Alert and oriented, no acute distress HEENT: Normal Neck: No bruit or JVD Pulmonary: Clear to auscultation bilaterally Cardiac: Regular Rate and Rhythm without murmur Abdomen: Soft, non-tender, non-distended, no mass, no scars Skin: No rash Extremity Pulses:  2+ radial, brachial, femoral, right popliteal graft, right dorsalis pedis,  non palpable posterior tibial pulses bilaterally Musculoskeletal: No deformity or edema  Neurologic: Upper and lower extremity motor 5/5 and symmetric  DATA:   ABI's  right monophasic flow 0.58 Left mono-phasic 0.48   ASSESSMENT:   S/P right fem-pop by pass followed by Aortobifemoral bypass with left femoarl artery endarterectomy. His by pass's are patent and he is able to walk as much as her wants with out symptoms of claudication.  He is very pleased with his out come after surgery.   PLAN:  He will start an exercise program of walking up to 30 min. Per day.  He has also set a goal for healthy eating and he and his wife have bought a Nija blender and plan on eatin more fruits and vegetables.  He will f/u in 1 year for  repeat ABI's.  Theda Sers, Santos Sollenberger [facility] PA-C [facility] Office: [phone]  He was seen today in conjunction with [name]

## 2016-01-01 NOTE — Progress Notes (Signed)
Filed Vitals:   01/01/16 1508 01/01/16 1513  BP: 153/73 147/77  Pulse: 73 73  Temp: 98.8 F (37.1 C)   Resp: 18   Height: 5\' 10"  (1.778 m)   Weight: 154 lb (69.854 kg)   SpO2: 90%

## 2016-01-03 ENCOUNTER — Other Ambulatory Visit: Payer: Self-pay | Admitting: *Deleted

## 2016-01-03 DIAGNOSIS — I739 Peripheral vascular disease, unspecified: Secondary | ICD-10-CM

## 2016-01-04 ENCOUNTER — Other Ambulatory Visit: Payer: Self-pay | Admitting: Cardiology

## 2016-01-06 ENCOUNTER — Other Ambulatory Visit: Payer: Self-pay | Admitting: Cardiology

## 2016-01-06 NOTE — Telephone Encounter (Signed)
REFILL 

## 2016-01-08 ENCOUNTER — Other Ambulatory Visit: Payer: Self-pay | Admitting: Cardiology

## 2016-02-03 ENCOUNTER — Ambulatory Visit: Payer: No Typology Code available for payment source | Admitting: Cardiology

## 2016-02-06 ENCOUNTER — Other Ambulatory Visit: Payer: Self-pay | Admitting: Cardiology

## 2016-02-06 NOTE — Telephone Encounter (Signed)
Rx(s) sent to pharmacy electronically.  

## 2016-02-25 ENCOUNTER — Ambulatory Visit: Payer: BLUE CROSS/BLUE SHIELD | Admitting: Cardiology

## 2016-03-08 ENCOUNTER — Other Ambulatory Visit: Payer: Self-pay | Admitting: Cardiology

## 2016-03-09 NOTE — Telephone Encounter (Signed)
Rx request sent to pharmacy.  

## 2016-03-15 ENCOUNTER — Other Ambulatory Visit: Payer: Self-pay | Admitting: Cardiology

## 2016-03-16 ENCOUNTER — Other Ambulatory Visit: Payer: Self-pay

## 2016-03-16 MED ORDER — PRAVASTATIN SODIUM 40 MG PO TABS: 40.0000 mg | ORAL_TABLET | Freq: Every day | ORAL | Status: DC

## 2016-03-16 NOTE — Telephone Encounter (Signed)
Rx(s) sent to pharmacy electronically.  

## 2016-04-08 ENCOUNTER — Other Ambulatory Visit: Payer: Self-pay | Admitting: Cardiology

## 2016-05-26 ENCOUNTER — Ambulatory Visit: Payer: BLUE CROSS/BLUE SHIELD | Admitting: Cardiology

## 2016-06-24 ENCOUNTER — Other Ambulatory Visit: Payer: Self-pay | Admitting: Cardiology

## 2016-06-24 ENCOUNTER — Telehealth: Payer: Self-pay | Admitting: Cardiology

## 2016-06-24 MED ORDER — QUINAPRIL HCL 10 MG PO TABS: 10.0000 mg | ORAL_TABLET | Freq: Every day | ORAL | 0 refills | Status: DC

## 2016-06-24 NOTE — Telephone Encounter (Signed)
New Message   *STAT* If patient is at the pharmacy, call can be transferred to refill team.   1. Which medications need to be refilled? (please list name of each medication and dose if known)  carvedilol 12.5 mg tablet twice daily & quinapril 10 mg tablet once daily  2. Which pharmacy/location (including street and city if local pharmacy) is medication to be sent to? [place], [place], [place]  3. Do they need a 30 day or 90 day supply? 30 day

## 2016-10-08 ENCOUNTER — Telehealth: Payer: Self-pay | Admitting: Cardiology

## 2016-10-08 ENCOUNTER — Other Ambulatory Visit: Payer: Self-pay | Admitting: *Deleted

## 2016-10-08 MED ORDER — CARVEDILOL 12.5 MG PO TABS: 12.5000 mg | ORAL_TABLET | Freq: Two times a day (BID) | ORAL | 0 refills | Status: DC

## 2016-10-08 MED ORDER — QUINAPRIL HCL 10 MG PO TABS: 10.0000 mg | ORAL_TABLET | Freq: Every day | ORAL | 0 refills | Status: DC

## 2016-10-08 NOTE — Telephone Encounter (Signed)
°*  STAT* If patient is at the pharmacy, call can be transferred to refill team.   1. Which medications need to be refilled? (please list name of each medication and dose if known) carvedilol 12.5 mg quinapril 10 mg  2. Which pharmacy/location (including street and city if local pharmacy) is medication to be sent to? [facility] on [address]  3. Do they need a 30 day or 90 day supply? 90 day

## 2016-10-10 NOTE — Progress Notes (Signed)
Kyle [name] Date of Birth: 10/17/1951 Medical Record #[id number]  History of Present Illness: Kyle [name] is seen for follow up CHF.  He has a history of systolic CHF with EF of 25-05% in May 2012. Coronary anatomy was normal by cath. He has a bicuspid AV with moderate AS/AI. He had moderate pulmonary HTN. He was treated medically and one of my old notes stated that his LV function showed marked improvement with  Echo in 2011 showing improvement in EF to 55-60%. I have been unable to find this Echo study to review. Unfortunately , he was lost to follow up and was not taking medication for over 2 years. He also has a history of HTN, hyperlipidemia, and PAD s/p right fem-pop BPG.  When seen in October 2015 he had evidence of recurrent CHF with severe HTN. Echo showed an EF of 20-25% with moderate AS/AI. He was started back on medication with lasix, carvedilol, and quinapril. He did not come back for follow up as instructed but states he has been taking his medication. He was last seen in Jan. 2016 for surgical clearance for vascular surgery.  He was seen by [name] for claudication and was found to have severe PAD. Revascularization recommended. He underwent aortobifemoral BPG in February 2016. He has been lost to follow up here since March 2016. He did see [name] in March 2017. He states he ran out of medication and really the reason he is here today is to get his meds. He is still smoking some but states he is doing the best he can. Denies SOB, chest pain, or increased edema. Wife notes he breathes heavy when sleeping but does not snore or have apnea.   Allergies as of 10/12/2016   No Known Allergies     Medication List       Accurate as of 10/12/16  3:39 PM. Always use your most recent med list.          amLODipine 5 MG tablet Commonly known as:  NORVASC TAKE ONE TABLET BY MOUTH ONCE DAILY   aspirin 81 MG chewable tablet Chew 81 mg by mouth daily.   carvedilol 12.5 MG  tablet Commonly known as:  COREG Take 1 tablet (12.5 mg total) by mouth 2 (two) times daily with a meal.   furosemide 40 MG tablet Commonly known as:  LASIX TAKE ONE TABLET BY MOUTH ONCE DAILY   oxyCODONE-acetaminophen 5-325 MG tablet Commonly known as:  PERCOCET/ROXICET Take 1-2 tablets by mouth every 4 (four) hours as needed for moderate pain.   pravastatin 40 MG tablet Commonly known as:  PRAVACHOL Take 1 tablet (40 mg total) by mouth at bedtime.   quinapril 10 MG tablet Commonly known as:  ACCUPRIL Take 1 tablet (10 mg total) by mouth daily.        No Known Allergies  Past Medical History:  Diagnosis Date  . Aortic insufficiency    MODERATE WITH A BICUSPID AORTIC VAVLE  . Arterial occlusive disease ([place])    MULTILEVEL  . CHF (congestive heart failure) ([place])   . COPD (chronic obstructive pulmonary disease) ([place])   . Dilated cardiomyopathy (HCC)    WITH EJECTION FRACTION DOWN TO 20-25%--WITH CONGESTIVE HEART FAILURE  . Edema    LOWER EXTREMETIES  . Hypertension   . Normal coronary arteries 2009  . Orthopnea   . Peripheral arterial disease ([place])   . Pulmonary hypertension   . SOB (shortness of breath)     Past Surgical  History:  Procedure Laterality Date  . ABDOMINAL AORTAGRAM N/A 11/05/2014   Procedure: ABDOMINAL Maxcine Ham;  Surgeon: Angelia Mould, MD;  Location: [facility] CATH LAB;  Service: Cardiovascular;  Laterality: N/A;  . AORTA - BILATERAL FEMORAL ARTERY BYPASS GRAFT N/A 12/18/2014   Procedure: AORTOBIFEMORAL BYPASS GRAFT;  Surgeon: Angelia Mould, MD;  Location: [facility];  Service: Vascular;  Laterality: N/A;  . CARDIAC CATHETERIZATION  2009   Nl Cors, EF 20%  . COLONOSCOPY    . ENDARTERECTOMY FEMORAL Left 12/18/2014   Procedure: ENDARTERECTOMY FEMORAL;  Surgeon: Angelia Mould, MD;  Location: [facility];  Service: Vascular;  Laterality: Left;  . FEMORAL-POPLITEAL BYPASS GRAFT  02/21/2011   right  . OTHER SURGICAL HISTORY     POST RIGHT  FEMOROPOPLITEAL BYPASS GRAFT  . PERIPHERAL VASCULAR CATHETERIZATION  11/05/2014   Procedure: LOWER EXTREMITY ANGIOGRAPHY;  Surgeon: Angelia Mould, MD;  Location: [facility] CATH LAB;  Service: Cardiovascular;;    Social History   Social History  . Marital status: Married    Spouse name: N/A  . Number of children: 1  . Years of education: N/A   Occupational History  .  [employer]   Social History Main Topics  . Smoking status: Former Smoker    Packs/day: 1.00  . Smokeless tobacco: Never Used     Comment: quit 2010  . Alcohol use No  . Drug use: No  . Sexual activity: Not Asked   Other Topics Concern  . None   Social History Narrative  . None    Family History  Problem Relation Age of Onset  . Diabetes Mother   . Hyperlipidemia Sister   . Hypertension Sister     Review of Systems: As noted in HPI. He states his feet and toes go completely cold at night and he has to get up and move around for relief.  All other systems were reviewed and are negative.  Physical Exam: BP (!) 176/74   Pulse 83   Ht 5\' 11"  (1.803 m)   Wt 148 lb 6.4 oz (67.3 kg)   BMI 20.70 kg/m  Filed Weights   10/12/16 1502  Weight: 148 lb 6.4 oz (67.3 kg)  GENERAL:  Well appearing, thin BM in NAD HEENT:  PERRL, EOMI, sclera are clear. Oropharynx is clear. NECK:  No JVD, carotid upstroke brisk and symmetric, no bruits, no thyromegaly or adenopathy LUNGS:  Clear CHEST:  Unremarkable HEART:  RRR,  PMI displaced laterally,S1 and S2 within normal limits, positive S4. No murmur. ABD:  Soft, nontender. BS +, no masses or bruits. No hepatomegaly, no splenomegaly EXT:  Pedal pulses reduced but feet warm, no edema. SKIN:  Warm and dry.  No rashes NEURO:  Alert and oriented x 3. Cranial nerves II through XII intact. PSYCH:  Cognitively intact    LABORATORY DATA: . Lab Results  Component Value Date   WBC 9.0 12/24/2014   HGB 12.8 (L) 12/24/2014   HCT 38.4 (L) 12/24/2014   PLT 230 12/24/2014     GLUCOSE 135 (H) 12/24/2014   CHOL 120 08/20/2014   TRIG 81 08/20/2014   HDL 24 (L) 08/20/2014   LDLCALC 80 08/20/2014   ALT 13 12/19/2014   AST 27 12/19/2014   NA 138 12/24/2014   K 3.7 12/24/2014   CL 100 12/24/2014   CREATININE 0.76 12/24/2014   BUN 12 12/24/2014   CO2 33 (H) 12/24/2014   TSH 3.244 08/20/2014   INR 1.15 12/18/2014   HGBA1C (  H) 10/17/2008    6.7 (NOTE)   The ADA recommends the following therapeutic goal for glycemic   control related to Hgb A1C measurement:   Goal of Therapy:   < 7.0% Hgb A1C   Reference: American Diabetes Association: Clinical Practice   Recommendations 2008, Diabetes Care,  2008, 31:(Suppl 1).   Ecg today shows NSR with LVH and repolarization abnormality. I have personally reviewed and interpreted this study.   Echo: 11/06/14:Study Conclusions  - Left ventricle: The cavity size was normal. Wall thickness was increased in a pattern of severe LVH. Systolic function was severely reduced. The estimated ejection fraction was in the range of 20% to 25%. Diffuse hypokinesis. Doppler parameters are consistent with abnormal left ventricular relaxation (grade 1 diastolic dysfunction). - Aortic valve: Valve mobility was restricted. There was moderate stenosis. There was moderate regurgitation. Valve area (VTI): 1.36 cm^2. Valve area (Vmax): 1.45 cm^2. Valve area (Vmean): 1.25 cm^2. - Left atrium: The atrium was mildly dilated. - Right ventricle: The cavity size was mildly dilated. Systolic function was severely reduced. - Right atrium: The atrium was mildly to moderately dilated. - Pulmonary arteries: Systolic pressure was severely increased. - Pericardium, extracardiac: A trivial pericardial effusion was identified.  Impressions:  - Severe global reduction in LV function; severe LVH; grade 1 diastolic dysfunction; biatrial enlargement; severe RV dysfunction; calcified aortic valve with moderate AS and moderate AI;  mild TR with severely elevated pulmonary pressure.  Dobutamine Echo 11/22/14:Study Conclusions  - Baseline ECG: NSR with PVC&'s and PAC&'s. - Stress ECG conclusions: NSR (HR to 77 on max dose dobutamine - PAC&'s and PVC&'s. - Baseline: LVEF 20-25%, severe LVH, bicuspid aortic valve with moderate stenosis (31 and 16 mmHg gradients). - Low dose: LVEF 25-30%, peak and mean gradients of 29 and 15 mmHg. - Peak stress: LVEF 30-35%, peak and mean gradients of 43 and 23 mmHg. - Recovery: LVEF 25%, peak and mean gradients of 35 and 19 mmHg.  Impressions:  - LVEF 20-25%, increased up to 35% on max dose dobutamine - HR remained flat, suggesting possible chronotropic incompetence. PAC&'s and PVC&'s were noted. The max gradients on high dose intrope were 43 and 23 mmHg, respectively - consistent with at most moderate aortic stenosis - there was a linear increase in cardiac output with increasing doses of dobutamine.  Assessment / Plan: 1. Chronic systolic CHF.  He has severe cardiomyopathy- nonischemic.  AS previously mild to moderate based on Dobutamine Echo.   We will increase quinapril to 20 mg daily. Check BMET and BNP. Will resume his lasix, quinapril, Coreg. Sodium restriction. Check complete labs today. Arrange for Echo. Follow up with extender 6 weeks.   2. HTN uncontrolled with noncompliance. Will resume therapy as noted. Close follow up with titration of meds as tolerated.   3. PAD s/p right fem-pop bypass. S/p aortobifemoral BPG Feb. 2016. He did have LE arterial dopplers in March 2017 showing ABI .58 on the right and .48 on the left. Follow up with vascular.   4. Hyperlipidemia. Refilled Pravastatin 40 mg daily. Check labs.  5. Biscuspid AV with mild-moderate AS/AI. No evidence of low gradient severe AS by Dobutamine Echo in past. Will update TTE.   6. Tobacco abuse- counseled on smoking cessation.

## 2016-10-12 ENCOUNTER — Ambulatory Visit (INDEPENDENT_AMBULATORY_CARE_PROVIDER_SITE_OTHER): Payer: Self-pay | Admitting: Cardiology

## 2016-10-12 ENCOUNTER — Encounter: Payer: Self-pay | Admitting: Cardiology

## 2016-10-12 VITALS — BP 176/74 | HR 83 | Ht 71.0 in | Wt 148.4 lb

## 2016-10-12 DIAGNOSIS — I10 Essential (primary) hypertension: Secondary | ICD-10-CM

## 2016-10-12 DIAGNOSIS — I739 Peripheral vascular disease, unspecified: Secondary | ICD-10-CM

## 2016-10-12 DIAGNOSIS — I350 Nonrheumatic aortic (valve) stenosis: Secondary | ICD-10-CM

## 2016-10-12 DIAGNOSIS — I420 Dilated cardiomyopathy: Secondary | ICD-10-CM

## 2016-10-12 DIAGNOSIS — I5022 Chronic systolic (congestive) heart failure: Secondary | ICD-10-CM

## 2016-10-12 MED ORDER — CARVEDILOL 12.5 MG PO TABS
12.5000 mg | ORAL_TABLET | Freq: Two times a day (BID) | ORAL | 3 refills | Status: DC
Start: 2016-10-12 — End: 2017-02-23

## 2016-10-12 MED ORDER — FUROSEMIDE 40 MG PO TABS: 40.0000 mg | ORAL_TABLET | Freq: Every day | ORAL | 6 refills | Status: DC

## 2016-10-12 MED ORDER — ASPIRIN 81 MG PO CHEW
81.0000 mg | CHEWABLE_TABLET | Freq: Every day | ORAL | 6 refills | Status: DC
Start: 2016-10-12 — End: 2018-12-06

## 2016-10-12 MED ORDER — AMLODIPINE BESYLATE 5 MG PO TABS: 5.0000 mg | ORAL_TABLET | Freq: Every day | ORAL | 6 refills | Status: DC

## 2016-10-12 MED ORDER — PRAVASTATIN SODIUM 40 MG PO TABS: 40.0000 mg | ORAL_TABLET | Freq: Every day | ORAL | 6 refills | Status: DC

## 2016-10-12 MED ORDER — QUINAPRIL HCL 10 MG PO TABS: 10.0000 mg | ORAL_TABLET | Freq: Every day | ORAL | 3 refills | Status: DC

## 2016-10-12 NOTE — Patient Instructions (Signed)
Resume your medication   We will get blood work today  We will schedule you for an Echocardiogram  Quit smoking  We will arrange follow up in 6 weeks.

## 2016-10-16 ENCOUNTER — Other Ambulatory Visit (HOSPITAL_COMMUNITY)
Admission: RE | Admit: 2016-10-16 | Discharge: 2016-10-16 | Disposition: A | Discharge status: Home / Self Care | Payer: Self-pay | Source: Ambulatory Visit | Attending: Cardiology | Admitting: Cardiology

## 2016-10-16 DIAGNOSIS — I110 Hypertensive heart disease with heart failure: Secondary | ICD-10-CM | POA: Insufficient documentation

## 2016-10-16 DIAGNOSIS — I350 Nonrheumatic aortic (valve) stenosis: Secondary | ICD-10-CM | POA: Insufficient documentation

## 2016-10-16 DIAGNOSIS — I420 Dilated cardiomyopathy: Secondary | ICD-10-CM | POA: Insufficient documentation

## 2016-10-16 DIAGNOSIS — I739 Peripheral vascular disease, unspecified: Secondary | ICD-10-CM | POA: Insufficient documentation

## 2016-10-16 DIAGNOSIS — I5022 Chronic systolic (congestive) heart failure: Secondary | ICD-10-CM | POA: Insufficient documentation

## 2016-10-16 LAB — HEPATIC FUNCTION PANEL
ALK PHOS: 69 U/L (ref 38–126)
ALT: 38 U/L (ref 17–63)
AST: 36 U/L (ref 15–41)
Albumin: 3.6 g/dL (ref 3.5–5.0)
BILIRUBIN DIRECT: 0.2 mg/dL (ref 0.1–0.5)
BILIRUBIN INDIRECT: 1 mg/dL — AB (ref 0.3–0.9)
BILIRUBIN TOTAL: 1.2 mg/dL (ref 0.3–1.2)
TOTAL PROTEIN: 7.2 g/dL (ref 6.5–8.1)

## 2016-10-16 LAB — CBC
HEMATOCRIT: 50.5 % (ref 39.0–52.0)
HEMOGLOBIN: 17.4 g/dL — AB (ref 13.0–17.0)
MCH: 32.4 pg (ref 26.0–34.0)
MCHC: 34.5 g/dL (ref 30.0–36.0)
MCV: 94 fL (ref 78.0–100.0)
Platelets: 220 10*3/uL (ref 150–400)
RBC: 5.37 MIL/uL (ref 4.22–5.81)
RDW: 14.9 % (ref 11.5–15.5)
WBC: 7.2 10*3/uL (ref 4.0–10.5)

## 2016-10-16 LAB — LIPID PANEL
CHOLESTEROL: 149 mg/dL (ref 0–200)
HDL: 36 mg/dL — ABNORMAL LOW (ref >40)
LDL Cholesterol: 93 mg/dL (ref 0–99)
Total CHOL/HDL Ratio: 4.1 RATIO
Triglycerides: 101 mg/dL (ref <150)
VLDL: 20 mg/dL (ref 0–40)

## 2016-10-16 LAB — BASIC METABOLIC PANEL
ANION GAP: 11 (ref 5–15)
BUN: 22 mg/dL — ABNORMAL HIGH (ref 6–20)
CALCIUM: 9.2 mg/dL (ref 8.9–10.3)
CO2: 27 mmol/L (ref 22–32)
Chloride: 103 mmol/L (ref 101–111)
Creatinine, Ser: 1.14 mg/dL (ref 0.61–1.24)
GFR calc Af Amer: >60 mL/min (ref >60)
GFR calc non Af Amer: >60 mL/min (ref >60)
GLUCOSE: 157 mg/dL — AB (ref 65–99)
Potassium: 3.9 mmol/L (ref 3.5–5.1)
Sodium: 141 mmol/L (ref 135–145)

## 2016-10-16 LAB — BRAIN NATRIURETIC PEPTIDE: B NATRIURETIC PEPTIDE 5: 660.2 pg/mL — AB (ref 0.0–100.0)

## 2016-11-04 ENCOUNTER — Other Ambulatory Visit (HOSPITAL_COMMUNITY): Payer: Self-pay

## 2016-11-23 ENCOUNTER — Encounter: Payer: Self-pay | Admitting: Cardiology

## 2016-11-25 ENCOUNTER — Ambulatory Visit: Payer: Self-pay | Admitting: Nurse Practitioner

## 2016-11-27 ENCOUNTER — Ambulatory Visit: Payer: Self-pay | Admitting: Cardiology

## 2017-01-06 ENCOUNTER — Encounter (HOSPITAL_COMMUNITY): Payer: BLUE CROSS/BLUE SHIELD

## 2017-01-06 ENCOUNTER — Ambulatory Visit: Payer: BLUE CROSS/BLUE SHIELD | Admitting: Vascular Surgery

## 2017-02-23 ENCOUNTER — Other Ambulatory Visit: Payer: Self-pay | Admitting: *Deleted

## 2017-02-23 MED ORDER — QUINAPRIL HCL 10 MG PO TABS: 10.0000 mg | ORAL_TABLET | Freq: Every day | ORAL | 0 refills | Status: DC

## 2017-02-23 MED ORDER — CARVEDILOL 12.5 MG PO TABS: 12.5000 mg | ORAL_TABLET | Freq: Two times a day (BID) | ORAL | 0 refills | Status: DC

## 2017-02-23 MED ORDER — AMLODIPINE BESYLATE 5 MG PO TABS: 5.0000 mg | ORAL_TABLET | Freq: Every day | ORAL | 0 refills | Status: DC

## 2017-02-23 MED ORDER — PRAVASTATIN SODIUM 40 MG PO TABS: 40.0000 mg | ORAL_TABLET | Freq: Every day | ORAL | 0 refills | Status: DC

## 2017-02-28 ENCOUNTER — Other Ambulatory Visit: Payer: Self-pay | Admitting: Cardiology

## 2017-03-01 ENCOUNTER — Other Ambulatory Visit: Payer: Self-pay

## 2017-03-01 MED ORDER — FUROSEMIDE 40 MG PO TABS: 40.0000 mg | ORAL_TABLET | Freq: Every day | ORAL | 6 refills | Status: DC

## 2017-03-04 ENCOUNTER — Other Ambulatory Visit: Payer: Self-pay | Admitting: *Deleted

## 2017-03-04 MED ORDER — FUROSEMIDE 40 MG PO TABS: 40.0000 mg | ORAL_TABLET | Freq: Every day | ORAL | 0 refills | Status: DC

## 2017-03-25 ENCOUNTER — Ambulatory Visit (HOSPITAL_COMMUNITY): Payer: Medicare HMO | Attending: Internal Medicine

## 2017-04-09 ENCOUNTER — Other Ambulatory Visit (HOSPITAL_COMMUNITY): Payer: Medicare HMO

## 2017-04-30 ENCOUNTER — Other Ambulatory Visit (HOSPITAL_COMMUNITY): Payer: Medicare HMO

## 2017-05-06 ENCOUNTER — Other Ambulatory Visit: Payer: Self-pay

## 2017-05-06 ENCOUNTER — Ambulatory Visit (HOSPITAL_COMMUNITY): Payer: Medicare HMO | Attending: Cardiology

## 2017-05-06 DIAGNOSIS — I110 Hypertensive heart disease with heart failure: Secondary | ICD-10-CM | POA: Insufficient documentation

## 2017-05-06 DIAGNOSIS — I700 Atherosclerosis of aorta: Secondary | ICD-10-CM | POA: Diagnosis not present

## 2017-05-06 DIAGNOSIS — I420 Dilated cardiomyopathy: Secondary | ICD-10-CM | POA: Insufficient documentation

## 2017-05-06 DIAGNOSIS — I350 Nonrheumatic aortic (valve) stenosis: Secondary | ICD-10-CM | POA: Insufficient documentation

## 2017-05-06 DIAGNOSIS — I5022 Chronic systolic (congestive) heart failure: Secondary | ICD-10-CM | POA: Diagnosis not present

## 2017-05-06 DIAGNOSIS — I739 Peripheral vascular disease, unspecified: Secondary | ICD-10-CM | POA: Diagnosis not present

## 2017-05-06 DIAGNOSIS — I10 Essential (primary) hypertension: Secondary | ICD-10-CM

## 2017-05-17 ENCOUNTER — Encounter: Payer: Self-pay | Admitting: Vascular Surgery

## 2017-05-19 ENCOUNTER — Other Ambulatory Visit: Payer: Self-pay

## 2017-05-19 DIAGNOSIS — I739 Peripheral vascular disease, unspecified: Secondary | ICD-10-CM

## 2017-05-26 ENCOUNTER — Ambulatory Visit (HOSPITAL_COMMUNITY): Payer: Medicare HMO

## 2017-05-26 ENCOUNTER — Ambulatory Visit: Payer: Medicare HMO | Admitting: Vascular Surgery

## 2017-06-12 NOTE — Progress Notes (Deleted)
Kyle [name] Date of Birth: 06-13-1951 Medical Record #[id number]  History of Present Illness: Kyle [name] is seen for follow up CHF.  He has a history of systolic CHF with EF of 27-78% in May 2012. Coronary anatomy was normal by cath. He has a bicuspid AV with moderate AS/AI. He had moderate pulmonary HTN. He was treated medically and one of my old notes stated that his LV function showed marked improvement with  Echo in 2011 showing improvement in EF to 55-60%. I have been unable to find this Echo study to review. Unfortunately , he was lost to follow up and was not taking medication for over 2 years. He also has a history of HTN, hyperlipidemia, and PAD s/p right fem-pop BPG.  When seen in October 2015 he had evidence of recurrent CHF with severe HTN. Echo showed an EF of 20-25% with moderate AS/AI. He was started back on medication with lasix, carvedilol, and quinapril. He did not come back for follow up as instructed but states he has been taking his medication. He was last seen in Jan. 2016 for surgical clearance for vascular surgery.  He was seen by [name] for claudication and was found to have severe PAD. Revascularization recommended. He underwent aortobifemoral BPG in February 2016. He has been lost to follow up here since March 2016. He did see [name] in March 2017. He states he ran out of medication and really the reason he is here today is to get his meds. He is still smoking some but states he is doing the best he can. Denies SOB, chest pain, or increased edema. Wife notes he breathes heavy when sleeping but does not snore or have apnea.   Allergies as of 06/14/2017   No Known Allergies     Medication List       Accurate as of 06/12/17  8:47 AM. Always use your most recent med list.          amLODipine 5 MG tablet Commonly known as:  NORVASC TAKE 1 TABLET EVERY DAY (NEEDS TO BE SEEN)   aspirin 81 MG chewable tablet Chew 1 tablet (81 mg total) by mouth daily.     carvedilol 12.5 MG tablet Commonly known as:  COREG TAKE 1 TABLET TWICE DAILY WITH MEALS (NEEDS TO BE SEEN)   furosemide 40 MG tablet Commonly known as:  LASIX Take 1 tablet (40 mg total) by mouth daily. NEED OV.   oxyCODONE-acetaminophen 5-325 MG tablet Commonly known as:  PERCOCET/ROXICET Take 1-2 tablets by mouth every 4 (four) hours as needed for moderate pain.   pravastatin 40 MG tablet Commonly known as:  PRAVACHOL TAKE 1 TABLET AT BEDTIME (NEEDS TO BE SEEN)   quinapril 10 MG tablet Commonly known as:  ACCUPRIL TAKE 1 TABLET EVERY DAY (NEEDS TO BE SEEN)        No Known Allergies  Past Medical History:  Diagnosis Date  . Aortic insufficiency    MODERATE WITH A BICUSPID AORTIC VAVLE  . Arterial occlusive disease    MULTILEVEL  . CHF (congestive heart failure) ([place])   . COPD (chronic obstructive pulmonary disease) ([place])   . Dilated cardiomyopathy (HCC)    WITH EJECTION FRACTION DOWN TO 20-25%--WITH CONGESTIVE HEART FAILURE  . Edema    LOWER EXTREMETIES  . Hypertension   . Normal coronary arteries 2009  . Orthopnea   . Peripheral arterial disease ([place])   . Pulmonary hypertension ([place])   . SOB (shortness of breath)  Past Surgical History:  Procedure Laterality Date  . ABDOMINAL AORTAGRAM N/A 11/05/2014   Procedure: ABDOMINAL Maxcine Ham;  Surgeon: Angelia Mould, MD;  Location: [facility] CATH LAB;  Service: Cardiovascular;  Laterality: N/A;  . AORTA - BILATERAL FEMORAL ARTERY BYPASS GRAFT N/A 12/18/2014   Procedure: AORTOBIFEMORAL BYPASS GRAFT;  Surgeon: Angelia Mould, MD;  Location: [place];  Service: Vascular;  Laterality: N/A;  . CARDIAC CATHETERIZATION  2009   Nl Cors, EF 20%  . COLONOSCOPY    . ENDARTERECTOMY FEMORAL Left 12/18/2014   Procedure: ENDARTERECTOMY FEMORAL;  Surgeon: Angelia Mould, MD;  Location: [place];  Service: Vascular;  Laterality: Left;  . FEMORAL-POPLITEAL BYPASS GRAFT  02/21/2011   right  . OTHER SURGICAL HISTORY      POST RIGHT FEMOROPOPLITEAL BYPASS GRAFT  . PERIPHERAL VASCULAR CATHETERIZATION  11/05/2014   Procedure: LOWER EXTREMITY ANGIOGRAPHY;  Surgeon: Angelia Mould, MD;  Location: [facility] CATH LAB;  Service: Cardiovascular;;    Social History   Social History  . Marital status: Married    Spouse name: N/A  . Number of children: 1  . Years of education: N/A   Occupational History  .  [employer]   Social History Main Topics  . Smoking status: Former Smoker    Packs/day: 1.00  . Smokeless tobacco: Never Used     Comment: quit 2010  . Alcohol use No  . Drug use: No  . Sexual activity: Not on file   Other Topics Concern  . Not on file   Social History Narrative  . No narrative on file    Family History  Problem Relation Age of Onset  . Diabetes Mother   . Hyperlipidemia Sister   . Hypertension Sister     Review of Systems: As noted in HPI. He states his feet and toes go completely cold at night and he has to get up and move around for relief.  All other systems were reviewed and are negative.  Physical Exam: There were no vitals taken for this visit. There were no vitals filed for this visit.GENERAL:  Well appearing, thin BM in NAD HEENT:  PERRL, EOMI, sclera are clear. Oropharynx is clear. NECK:  No JVD, carotid upstroke brisk and symmetric, no bruits, no thyromegaly or adenopathy LUNGS:  Clear CHEST:  Unremarkable HEART:  RRR,  PMI displaced laterally,S1 and S2 within normal limits, positive S4. No murmur. ABD:  Soft, nontender. BS +, no masses or bruits. No hepatomegaly, no splenomegaly EXT:  Pedal pulses reduced but feet warm, no edema. SKIN:  Warm and dry.  No rashes NEURO:  Alert and oriented x 3. Cranial nerves II through XII intact. PSYCH:  Cognitively intact    LABORATORY DATA: . Lab Results  Component Value Date   WBC 7.2 10/16/2016   HGB 17.4 (H) 10/16/2016   HCT 50.5 10/16/2016   PLT 220 10/16/2016   GLUCOSE 157 (H) 10/16/2016   CHOL 149  10/16/2016   TRIG 101 10/16/2016   HDL 36 (L) 10/16/2016   LDLCALC 93 10/16/2016   ALT 38 10/16/2016   AST 36 10/16/2016   NA 141 10/16/2016   K 3.9 10/16/2016   CL 103 10/16/2016   CREATININE 1.14 10/16/2016   BUN 22 (H) 10/16/2016   CO2 27 10/16/2016   TSH 3.244 08/20/2014   INR 1.15 12/18/2014   HGBA1C (H) 10/17/2008    6.7 (NOTE)   The ADA recommends the following therapeutic goal for glycemic   control related to  Hgb A1C measurement:   Goal of Therapy:   < 7.0% Hgb A1C   Reference: American Diabetes Association: Clinical Practice   Recommendations 2008, Diabetes Care,  2008, 31:(Suppl 1).    Labs dated 04/19/17: BMET normal. A1c 13.4%.    Ecg today shows NSR with LVH and repolarization abnormality. I have personally reviewed and interpreted this study.   Echo: 11/06/14:Study Conclusions  - Left ventricle: The cavity size was normal. Wall thickness was increased in a pattern of severe LVH. Systolic function was severely reduced. The estimated ejection fraction was in the range of 20% to 25%. Diffuse hypokinesis. Doppler parameters are consistent with abnormal left ventricular relaxation (grade 1 diastolic dysfunction). - Aortic valve: Valve mobility was restricted. There was moderate stenosis. There was moderate regurgitation. Valve area (VTI): 1.36 cm^2. Valve area (Vmax): 1.45 cm^2. Valve area (Vmean): 1.25 cm^2. - Left atrium: The atrium was mildly dilated. - Right ventricle: The cavity size was mildly dilated. Systolic function was severely reduced. - Right atrium: The atrium was mildly to moderately dilated. - Pulmonary arteries: Systolic pressure was severely increased. - Pericardium, extracardiac: A trivial pericardial effusion was identified.  Impressions:  - Severe global reduction in LV function; severe LVH; grade 1 diastolic dysfunction; biatrial enlargement; severe RV dysfunction; calcified aortic valve with moderate AS and  moderate AI; mild TR with severely elevated pulmonary pressure.  Dobutamine Echo 11/22/14:Study Conclusions  - Baseline ECG: NSR with PVC&'s and PAC&'s. - Stress ECG conclusions: NSR (HR to 77 on max dose dobutamine - PAC&'s and PVC&'s. - Baseline: LVEF 20-25%, severe LVH, bicuspid aortic valve with moderate stenosis (31 and 16 mmHg gradients). - Low dose: LVEF 25-30%, peak and mean gradients of 29 and 15 mmHg. - Peak stress: LVEF 30-35%, peak and mean gradients of 43 and 23 mmHg. - Recovery: LVEF 25%, peak and mean gradients of 35 and 19 mmHg.  Impressions:  - LVEF 20-25%, increased up to 35% on max dose dobutamine - HR remained flat, suggesting possible chronotropic incompetence. PAC&'s and PVC&'s were noted. The max gradients on high dose intrope were 43 and 23 mmHg, respectively - consistent with at most moderate aortic stenosis - there was a linear increase in cardiac output with increasing doses of dobutamine.   Echo 05/06/17: Study Conclusions  - Left ventricle: The cavity size was normal. Wall thickness was   increased in a pattern of mild LVH. Systolic function was normal.   The estimated ejection fraction was in the range of 50% to 55%.   Wall motion was normal; there were no regional wall motion   abnormalities. Doppler parameters are consistent with abnormal   left ventricular relaxation (grade 1 diastolic dysfunction). - Aortic valve: There was mild stenosis. There was mild to moderate   regurgitation. - Mitral valve: Calcified annulus. - Left atrium: The atrium was mildly dilated. - Right ventricle: The cavity size was mildly dilated. - Right atrium: The atrium was mildly dilated. - Pulmonary arteries: Systolic pressure was moderately increased.  Impressions:  - Normal LV systolic function; mild LVH; mild diastolic   dysfunction; calcified aortic valve with mild AS (mean gradient   18 mmHg) and mild to moderate AI; mild LAE; mild RAE and  RVE;   trace TR with moderately elevated pulmonary pressure.  Assessment / Plan: 1. Chronic systolic CHF.  He has a history of severe cardiomyopathy- nonischemic.  AS previously mild to moderate based on Dobutamine Echo. With resumption of medications most recent Echo shows normalization of LV function.  Stress importance of taking medications long term.   2. HTN uncontrolled with noncompliance. Will resume therapy as noted. Close follow up with titration of meds as tolerated.   3. PAD s/p right fem-pop bypass. S/p aortobifemoral BPG Feb. 2016. He did have LE arterial dopplers in March 2017 showing ABI .58 on the right and .48 on the left. Follow up with vascular.   4. Hyperlipidemia. Refilled Pravastatin 40 mg daily. Check labs.  5. Biscuspid AV with mild-moderate AS/AI.   6. Tobacco abuse- counseled on smoking cessation.

## 2017-06-14 ENCOUNTER — Ambulatory Visit: Payer: Medicare HMO | Admitting: Cardiology

## 2017-06-23 ENCOUNTER — Other Ambulatory Visit: Payer: Self-pay

## 2017-06-23 MED ORDER — QUINAPRIL HCL 10 MG PO TABS: ORAL_TABLET | ORAL | 0 refills | Status: DC

## 2017-06-30 ENCOUNTER — Other Ambulatory Visit: Payer: Self-pay | Admitting: Cardiology

## 2017-07-01 ENCOUNTER — Other Ambulatory Visit: Payer: Self-pay | Admitting: *Deleted

## 2017-07-01 MED ORDER — QUINAPRIL HCL 10 MG PO TABS: 10.0000 mg | ORAL_TABLET | Freq: Every day | ORAL | 0 refills | Status: DC

## 2017-07-09 ENCOUNTER — Other Ambulatory Visit: Payer: Self-pay

## 2017-07-09 MED ORDER — PRAVASTATIN SODIUM 40 MG PO TABS: ORAL_TABLET | ORAL | 0 refills | Status: DC

## 2017-07-21 ENCOUNTER — Encounter (HOSPITAL_COMMUNITY): Payer: Medicare HMO

## 2017-07-21 ENCOUNTER — Ambulatory Visit: Payer: Medicare HMO | Admitting: Vascular Surgery

## 2017-07-28 ENCOUNTER — Ambulatory Visit: Payer: Medicare HMO | Admitting: Cardiology

## 2017-08-02 ENCOUNTER — Ambulatory Visit: Payer: Medicare HMO | Admitting: Cardiology

## 2017-08-10 ENCOUNTER — Other Ambulatory Visit: Payer: Self-pay

## 2017-08-10 MED ORDER — PRAVASTATIN SODIUM 40 MG PO TABS: ORAL_TABLET | ORAL | 0 refills | Status: DC

## 2017-08-12 ENCOUNTER — Other Ambulatory Visit: Payer: Self-pay | Admitting: *Deleted

## 2017-08-12 MED ORDER — PRAVASTATIN SODIUM 40 MG PO TABS: ORAL_TABLET | ORAL | 0 refills | Status: DC

## 2017-08-26 NOTE — Progress Notes (Deleted)
Kyle [name] Date of Birth: October 23, 1951 Medical Record #[id number]  History of Present Illness: [name] is seen for follow up CHF.  He has a history of systolic CHF with EF of 06-23% in May 2012. Coronary anatomy was normal by cath. He has a bicuspid AV with moderate AS/AI. He had moderate pulmonary HTN. He was treated medically and one of my old notes stated that his LV function showed marked improvement with  Echo in 2011 showing improvement in EF to 55-60%. I have been unable to find this Echo study to review. Unfortunately , he was lost to follow up and was not taking medication for over 2 years. He also has a history of HTN, hyperlipidemia, and PAD s/p right fem-pop BPG.  When seen in October 2015 he had evidence of recurrent CHF with severe HTN. Echo showed an EF of 20-25% with moderate AS/AI. He was started back on medication with lasix, carvedilol, and quinapril. He did not come back for follow up as instructed but states he has been taking his medication. He was last seen in Jan. 2016 for surgical clearance for vascular surgery.  He was seen by [name] for claudication and was found to have severe PAD. Revascularization recommended. He underwent aortobifemoral BPG in February 2016. In July 2018 EF normalized by Echo. He has been noncompliant with regular follow up visits.    He states he ran out of medication and really the reason he is here today is to get his meds. He is still smoking some but states he is doing the best he can. Denies SOB, chest pain, or increased edema. Wife notes he breathes heavy when sleeping but does not snore or have apnea.   Allergies as of 08/31/2017   No Known Allergies     Medication List       Accurate as of 08/26/17  8:20 AM. Always use your most recent med list.          amLODipine 5 MG tablet Commonly known as:  NORVASC TAKE 1 TABLET EVERY DAY (NEEDS TO BE SEEN)   aspirin 81 MG chewable tablet Chew 1 tablet (81 mg total) by mouth daily.   carvedilol 12.5 MG tablet Commonly known as:  COREG TAKE 1 TABLET TWICE DAILY WITH MEALS (NEEDS TO BE SEEN)   furosemide 40 MG tablet Commonly known as:  LASIX Take 1 tablet (40 mg total) by mouth daily. NEED OV.   oxyCODONE-acetaminophen 5-325 MG tablet Commonly known as:  PERCOCET/ROXICET Take 1-2 tablets by mouth every 4 (four) hours as needed for moderate pain.   pravastatin 40 MG tablet Commonly known as:  PRAVACHOL TAKE 1 TABLET AT BEDTIME (NEEDS TO BE SEEN)   quinapril 10 MG tablet Commonly known as:  ACCUPRIL Take 1 tablet (10 mg total) by mouth daily. Take one tablet by mouth daily        No Known Allergies  Past Medical History:  Diagnosis Date  . Aortic insufficiency    MODERATE WITH A BICUSPID AORTIC VAVLE  . Arterial occlusive disease    MULTILEVEL  . CHF (congestive heart failure) ([place])   . COPD (chronic obstructive pulmonary disease) ([place])   . Dilated cardiomyopathy (HCC)    WITH EJECTION FRACTION DOWN TO 20-25%--WITH CONGESTIVE HEART FAILURE  . Edema    LOWER EXTREMETIES  . Hypertension   . Normal coronary arteries 2009  . Orthopnea   . Peripheral arterial disease ([place])   . Pulmonary hypertension ([place])   . SOB (  shortness of breath)     Past Surgical History:  Procedure Laterality Date  . ABDOMINAL AORTAGRAM N/A 11/05/2014   Procedure: ABDOMINAL Maxcine Ham;  Surgeon: Angelia Mould, MD;  [facility] CATH LAB;  Service: Cardiovascular;  Laterality: N/A;  . AORTA - BILATERAL FEMORAL ARTERY BYPASS GRAFT N/A 12/18/2014   Procedure: AORTOBIFEMORAL BYPASS GRAFT;  Surgeon: Angelia Mould, MD;  Location: [place];  Service: Vascular;  Laterality: N/A;  . CARDIAC CATHETERIZATION  2009   Nl Cors, EF 20%  . COLONOSCOPY    . ENDARTERECTOMY FEMORAL Left 12/18/2014   Procedure: ENDARTERECTOMY FEMORAL;  Surgeon: Angelia Mould, MD;  Location: [place];  Service: Vascular;  Laterality: Left;  . FEMORAL-POPLITEAL BYPASS GRAFT  02/21/2011   right    . OTHER SURGICAL HISTORY     POST RIGHT FEMOROPOPLITEAL BYPASS GRAFT  . PERIPHERAL VASCULAR CATHETERIZATION  11/05/2014   Procedure: LOWER EXTREMITY ANGIOGRAPHY;  Surgeon: Angelia Mould, MD;  Location: [facility] CATH LAB;  Service: Cardiovascular;;    Social History   Social History  . Marital status: Married    Spouse name: N/A  . Number of children: 1  . Years of education: N/A   Occupational History  .  [employer]   Social History Main Topics  . Smoking status: Former Smoker    Packs/day: 1.00  . Smokeless tobacco: Never Used     Comment: quit 2010  . Alcohol use No  . Drug use: No  . Sexual activity: Not on file   Other Topics Concern  . Not on file   Social History Narrative  . No narrative on file    Family History  Problem Relation Age of Onset  . Diabetes Mother   . Hyperlipidemia Sister   . Hypertension Sister     Review of Systems: As noted in HPI. He states his feet and toes go completely cold at night and he has to get up and move around for relief.  All other systems were reviewed and are negative.  Physical Exam: There were no vitals taken for this visit. There were no vitals filed for this visit.GENERAL:  Well appearing, thin BM in NAD HEENT:  PERRL, EOMI, sclera are clear. Oropharynx is clear. NECK:  No JVD, carotid upstroke brisk and symmetric, no bruits, no thyromegaly or adenopathy LUNGS:  Clear CHEST:  Unremarkable HEART:  RRR,  PMI displaced laterally,S1 and S2 within normal limits, positive S4. No murmur. ABD:  Soft, nontender. BS +, no masses or bruits. No hepatomegaly, no splenomegaly EXT:  Pedal pulses reduced but feet warm, no edema. SKIN:  Warm and dry.  No rashes NEURO:  Alert and oriented x 3. Cranial nerves II through XII intact. PSYCH:  Cognitively intact    LABORATORY DATA: . Lab Results  Component Value Date   WBC 7.2 10/16/2016   HGB 17.4 (H) 10/16/2016   HCT 50.5 10/16/2016   PLT 220 10/16/2016   GLUCOSE 157  (H) 10/16/2016   CHOL 149 10/16/2016   TRIG 101 10/16/2016   HDL 36 (L) 10/16/2016   LDLCALC 93 10/16/2016   ALT 38 10/16/2016   AST 36 10/16/2016   NA 141 10/16/2016   K 3.9 10/16/2016   CL 103 10/16/2016   CREATININE 1.14 10/16/2016   BUN 22 (H) 10/16/2016   CO2 27 10/16/2016   TSH 3.244 08/20/2014   INR 1.15 12/18/2014   HGBA1C (H) 10/17/2008    6.7 (NOTE)   The ADA recommends the following therapeutic  goal for glycemic   control related to Hgb A1C measurement:   Goal of Therapy:   < 7.0% Hgb A1C   Reference: American Diabetes Association: Clinical Practice   Recommendations 2008, Diabetes Care,  2008, 31:(Suppl 1).    Labs 07/28/17: cholesterol 137, triglycerides 63, HDL 43, LDL 81. A1c 6.2%. TSH and CMET normal.   Ecg today shows NSR with LVH and repolarization abnormality. I have personally reviewed and interpreted this study.   Echo: 11/06/14:Study Conclusions  - Left ventricle: The cavity size was normal. Wall thickness was increased in a pattern of severe LVH. Systolic function was severely reduced. The estimated ejection fraction was in the range of 20% to 25%. Diffuse hypokinesis. Doppler parameters are consistent with abnormal left ventricular relaxation (grade 1 diastolic dysfunction). - Aortic valve: Valve mobility was restricted. There was moderate stenosis. There was moderate regurgitation. Valve area (VTI): 1.36 cm^2. Valve area (Vmax): 1.45 cm^2. Valve area (Vmean): 1.25 cm^2. - Left atrium: The atrium was mildly dilated. - Right ventricle: The cavity size was mildly dilated. Systolic function was severely reduced. - Right atrium: The atrium was mildly to moderately dilated. - Pulmonary arteries: Systolic pressure was severely increased. - Pericardium, extracardiac: A trivial pericardial effusion was identified.  Impressions:  - Severe global reduction in LV function; severe LVH; grade 1 diastolic dysfunction; biatrial  enlargement; severe RV dysfunction; calcified aortic valve with moderate AS and moderate AI; mild TR with severely elevated pulmonary pressure.  Dobutamine Echo 11/22/14:Study Conclusions  - Baseline ECG: NSR with PVC&'s and PAC&'s. - Stress ECG conclusions: NSR (HR to 77 on max dose dobutamine - PAC&'s and PVC&'s. - Baseline: LVEF 20-25%, severe LVH, bicuspid aortic valve with moderate stenosis (31 and 16 mmHg gradients). - Low dose: LVEF 25-30%, peak and mean gradients of 29 and 15 mmHg. - Peak stress: LVEF 30-35%, peak and mean gradients of 43 and 23 mmHg. - Recovery: LVEF 25%, peak and mean gradients of 35 and 19 mmHg.  Impressions:  - LVEF 20-25%, increased up to 35% on max dose dobutamine - HR remained flat, suggesting possible chronotropic incompetence. PAC&'s and PVC&'s were noted. The max gradients on high dose intrope were 43 and 23 mmHg, respectively - consistent with at most moderate aortic stenosis - there was a linear increase in cardiac output with increasing doses of dobutamine.  Echo 05/06/17:Study Conclusions  - Left ventricle: The cavity size was normal. Wall thickness was   increased in a pattern of mild LVH. Systolic function was normal.   The estimated ejection fraction was in the range of 50% to 55%.   Wall motion was normal; there were no regional wall motion   abnormalities. Doppler parameters are consistent with abnormal   left ventricular relaxation (grade 1 diastolic dysfunction). - Aortic valve: There was mild stenosis. There was mild to moderate   regurgitation. - Mitral valve: Calcified annulus. - Left atrium: The atrium was mildly dilated. - Right ventricle: The cavity size was mildly dilated. - Right atrium: The atrium was mildly dilated. - Pulmonary arteries: Systolic pressure was moderately increased.  Impressions:  - Normal LV systolic function; mild LVH; mild diastolic   dysfunction; calcified aortic valve with mild  AS (mean gradient   18 mmHg) and mild to moderate AI; mild LAE; mild RAE and RVE;   trace TR with moderately elevated pulmonary pressure.    Assessment / Plan: 1. Chronic systolic CHF.  He has severe cardiomyopathy- nonischemic.  With appropriate medical therapy EF has now normalized.  We will increase quinapril to 20 mg daily. Check BMET and BNP. Will resume his lasix, quinapril, Coreg. Sodium restriction. Check complete labs today. Arrange for Echo. Follow up with extender 6 weeks.   2. HTN uncontrolled with noncompliance. Will resume therapy as noted. Close follow up with titration of meds as tolerated.   3. PAD s/p right fem-pop bypass. S/p aortobifemoral BPG Feb. 2016. He did have LE arterial dopplers in March 2017 showing ABI .58 on the right and .48 on the left. Follow up with vascular.   4. Hyperlipidemia. Refilled Pravastatin 40 mg daily. Check labs.  5. Biscuspid AV with mild AS/ mild- moderate AI. No evidence of low gradient severe AS by Dobutamine Echo in past. Will update TTE.   6. Tobacco abuse- counseled on smoking cessation.

## 2017-08-31 ENCOUNTER — Ambulatory Visit: Payer: Medicare HMO | Admitting: Cardiology

## 2017-09-22 ENCOUNTER — Encounter (HOSPITAL_COMMUNITY): Payer: Medicare HMO

## 2017-09-22 ENCOUNTER — Ambulatory Visit: Payer: Medicare HMO | Admitting: Vascular Surgery

## 2017-09-22 ENCOUNTER — Other Ambulatory Visit: Payer: Self-pay | Admitting: Cardiology

## 2017-09-30 ENCOUNTER — Other Ambulatory Visit: Payer: Self-pay | Admitting: Cardiology

## 2017-10-11 ENCOUNTER — Other Ambulatory Visit: Payer: Self-pay | Admitting: Cardiology

## 2017-11-17 NOTE — Progress Notes (Deleted)
Kyle [name] Date of Birth: 1951-06-23 Medical Record #[id number]  History of Present Illness: Kyle [name] is seen for follow up CHF. Last seen in December 2017. Has missed multiple appointments since then.  He has a history of systolic CHF with EF of 06-23% in May 2012. Coronary anatomy was normal by cath. He has a bicuspid AV with moderate AS/AI. He had moderate pulmonary HTN. He was treated medically and one of my old notes stated that his LV function showed marked improvement with  Echo in 2011 showing improvement in EF to 55-60%. I have been unable to find this Echo study to review. Unfortunately , he was lost to follow up and was not taking medication for over 2 years. He also has a history of HTN, hyperlipidemia, and PAD s/p right fem-pop BPG.  When seen in October 2015 he had evidence of recurrent CHF with severe HTN. Echo showed an EF of 20-25% with moderate AS/AI. He was started back on medication with lasix, carvedilol, and quinapril. He did not come back for follow up as instructed but states he has been taking his medication. He was last seen in Jan. 2016 for surgical clearance for vascular surgery.  He was seen by [name] for claudication and was found to have severe PAD. Revascularization recommended. He underwent aortobifemoral BPG in February 2016. He has been lost to follow up here since March 2016. He did see [name] in March 2017.  He states he ran out of medication and really the reason he is here today is to get his meds. He is still smoking some but states he is doing the best he can. Denies SOB, chest pain, or increased edema. Wife notes he breathes heavy when sleeping but does not snore or have apnea.   Allergies as of 11/23/2017   No Known Allergies     Medication List        Accurate as of 11/17/17  1:59 PM. Always use your most recent med list.          amLODipine 5 MG tablet Commonly known as:  NORVASC TAKE 1 TABLET EVERY DAY (NEEDS TO BE SEEN)     aspirin 81 MG chewable tablet Chew 1 tablet (81 mg total) by mouth daily.   carvedilol 12.5 MG tablet Commonly known as:  COREG TAKE 1 TABLET TWICE DAILY WITH MEALS (NEEDS TO BE SEEN)   furosemide 40 MG tablet Commonly known as:  LASIX TAKE 1 TABLET (40 MG TOTAL) BY MOUTH DAILY.   oxyCODONE-acetaminophen 5-325 MG tablet Commonly known as:  PERCOCET/ROXICET Take 1-2 tablets by mouth every 4 (four) hours as needed for moderate pain.   pravastatin 40 MG tablet Commonly known as:  PRAVACHOL TAKE 1 TABLET AT BEDTIME   quinapril 10 MG tablet Commonly known as:  ACCUPRIL TAKE 1 TABLET EVERY DAY        No Known Allergies  Past Medical History:  Diagnosis Date  . Aortic insufficiency    MODERATE WITH A BICUSPID AORTIC VAVLE  . Arterial occlusive disease    MULTILEVEL  . CHF (congestive heart failure) ([place])   . COPD (chronic obstructive pulmonary disease) ([place])   . Dilated cardiomyopathy (HCC)    WITH EJECTION FRACTION DOWN TO 20-25%--WITH CONGESTIVE HEART FAILURE  . Edema    LOWER EXTREMETIES  . Hypertension   . Normal coronary arteries 2009  . Orthopnea   . Peripheral arterial disease ([place])   . Pulmonary hypertension ([place])   . SOB (shortness  of breath)     Past Surgical History:  Procedure Laterality Date  . ABDOMINAL AORTAGRAM N/A 11/05/2014   Procedure: ABDOMINAL Maxcine Ham;  Surgeon: Angelia Mould, MD;  Location: [facility] CATH LAB;  Service: Cardiovascular;  Laterality: N/A;  . AORTA - BILATERAL FEMORAL ARTERY BYPASS GRAFT N/A 12/18/2014   Procedure: AORTOBIFEMORAL BYPASS GRAFT;  Surgeon: Angelia Mould, MD;  Location: [place];  Service: Vascular;  Laterality: N/A;  . CARDIAC CATHETERIZATION  2009   Nl Cors, EF 20%  . COLONOSCOPY    . ENDARTERECTOMY FEMORAL Left 12/18/2014   Procedure: ENDARTERECTOMY FEMORAL;  Surgeon: Angelia Mould, MD;  Location: [place];  Service: Vascular;  Laterality: Left;  . FEMORAL-POPLITEAL BYPASS GRAFT  02/21/2011   right   . OTHER SURGICAL HISTORY     POST RIGHT FEMOROPOPLITEAL BYPASS GRAFT  . PERIPHERAL VASCULAR CATHETERIZATION  11/05/2014   Procedure: LOWER EXTREMITY ANGIOGRAPHY;  Surgeon: Angelia Mould, MD;  Location: [facility] CATH LAB;  Service: Cardiovascular;;    Social History   Socioeconomic History  . Marital status: Married    Spouse name: Not on file  . Number of children: 1  . Years of education: Not on file  . Highest education level: Not on file  Social Needs  . Financial resource strain: Not on file  . Food insecurity - worry: Not on file  . Food insecurity - inability: Not on file  . Transportation needs - medical: Not on file  . Transportation needs - non-medical: Not on file  Occupational History    Employer: [employer]  Tobacco Use  . Smoking status: Former Smoker    Packs/day: 1.00  . Smokeless tobacco: Never Used  . Tobacco comment: quit 2010  Substance and Sexual Activity  . Alcohol use: No    Alcohol/week: 0.0 oz  . Drug use: No  . Sexual activity: Not on file  Other Topics Concern  . Not on file  Social History Narrative  . Not on file    Family History  Problem Relation Age of Onset  . Diabetes Mother   . Hyperlipidemia Sister   . Hypertension Sister     Review of Systems: As noted in HPI. He states his feet and toes go completely cold at night and he has to get up and move around for relief.  All other systems were reviewed and are negative.  Physical Exam: There were no vitals taken for this visit. There were no vitals filed for this visit.GENERAL:  Well appearing, thin BM in NAD HEENT:  PERRL, EOMI, sclera are clear. Oropharynx is clear. NECK:  No JVD, carotid upstroke brisk and symmetric, no bruits, no thyromegaly or adenopathy LUNGS:  Clear CHEST:  Unremarkable HEART:  RRR,  PMI displaced laterally,S1 and S2 within normal limits, positive S4. No murmur. ABD:  Soft, nontender. BS +, no masses or bruits. No hepatomegaly, no splenomegaly EXT:   Pedal pulses reduced but feet warm, no edema. SKIN:  Warm and dry.  No rashes NEURO:  Alert and oriented x 3. Cranial nerves II through XII intact. PSYCH:  Cognitively intact    LABORATORY DATA: . Lab Results  Component Value Date   WBC 7.2 10/16/2016   HGB 17.4 (H) 10/16/2016   HCT 50.5 10/16/2016   PLT 220 10/16/2016   GLUCOSE 157 (H) 10/16/2016   CHOL 149 10/16/2016   TRIG 101 10/16/2016   HDL 36 (L) 10/16/2016   LDLCALC 93 10/16/2016   ALT 38 10/16/2016   AST  36 10/16/2016   NA 141 10/16/2016   K 3.9 10/16/2016   CL 103 10/16/2016   CREATININE 1.14 10/16/2016   BUN 22 (H) 10/16/2016   CO2 27 10/16/2016   TSH 3.244 08/20/2014   INR 1.15 12/18/2014   HGBA1C (H) 10/17/2008    6.7 (NOTE)   The ADA recommends the following therapeutic goal for glycemic   control related to Hgb A1C measurement:   Goal of Therapy:   < 7.0% Hgb A1C   Reference: American Diabetes Association: Clinical Practice   Recommendations 2008, Diabetes Care,  2008, 31:(Suppl 1).    Labs dated 07/28/17: normal BMET. Cholesterol 137, triglycerides 63, HDL 43, LDL 81. TSH normal.  Ecg today shows NSR with LVH and repolarization abnormality. I have personally reviewed and interpreted this study.   Echo: 11/06/14:Study Conclusions  - Left ventricle: The cavity size was normal. Wall thickness was increased in a pattern of severe LVH. Systolic function was severely reduced. The estimated ejection fraction was in the range of 20% to 25%. Diffuse hypokinesis. Doppler parameters are consistent with abnormal left ventricular relaxation (grade 1 diastolic dysfunction). - Aortic valve: Valve mobility was restricted. There was moderate stenosis. There was moderate regurgitation. Valve area (VTI): 1.36 cm^2. Valve area (Vmax): 1.45 cm^2. Valve area (Vmean): 1.25 cm^2. - Left atrium: The atrium was mildly dilated. - Right ventricle: The cavity size was mildly dilated. Systolic function was  severely reduced. - Right atrium: The atrium was mildly to moderately dilated. - Pulmonary arteries: Systolic pressure was severely increased. - Pericardium, extracardiac: A trivial pericardial effusion was identified.  Impressions:  - Severe global reduction in LV function; severe LVH; grade 1 diastolic dysfunction; biatrial enlargement; severe RV dysfunction; calcified aortic valve with moderate AS and moderate AI; mild TR with severely elevated pulmonary pressure.  Dobutamine Echo 11/22/14:Study Conclusions  - Baseline ECG: NSR with PVC&'s and PAC&'s. - Stress ECG conclusions: NSR (HR to 77 on max dose dobutamine - PAC&'s and PVC&'s. - Baseline: LVEF 20-25%, severe LVH, bicuspid aortic valve with moderate stenosis (31 and 16 mmHg gradients). - Low dose: LVEF 25-30%, peak and mean gradients of 29 and 15 mmHg. - Peak stress: LVEF 30-35%, peak and mean gradients of 43 and 23 mmHg. - Recovery: LVEF 25%, peak and mean gradients of 35 and 19 mmHg.  Impressions:  - LVEF 20-25%, increased up to 35% on max dose dobutamine - HR remained flat, suggesting possible chronotropic incompetence. PAC&'s and PVC&'s were noted. The max gradients on high dose intrope were 43 and 23 mmHg, respectively - consistent with at most moderate aortic stenosis - there was a linear increase in cardiac output with increasing doses of dobutamine.  Assessment / Plan: 1. Chronic systolic CHF.  He has severe cardiomyopathy- nonischemic.  AS previously mild to moderate based on Dobutamine Echo.   We will increase quinapril to 20 mg daily. Check BMET and BNP. Will resume his lasix, quinapril, Coreg. Sodium restriction. Check complete labs today. Arrange for Echo. Follow up with extender 6 weeks.   2. HTN uncontrolled with noncompliance. Will resume therapy as noted. Close follow up with titration of meds as tolerated.   3. PAD s/p right fem-pop bypass. S/p aortobifemoral BPG Feb. 2016. He  did have LE arterial dopplers in March 2017 showing ABI .58 on the right and .48 on the left. Follow up with vascular.   4. Hyperlipidemia. Refilled Pravastatin 40 mg daily. Check labs.  5. Biscuspid AV with mild-moderate AS/AI. No evidence of low  gradient severe AS by Dobutamine Echo in past. Will update TTE.   6. Tobacco abuse- counseled on smoking cessation.

## 2017-11-18 ENCOUNTER — Ambulatory Visit: Payer: Medicare HMO | Admitting: Cardiology

## 2017-11-23 ENCOUNTER — Ambulatory Visit: Payer: Medicare HMO | Admitting: Cardiology

## 2017-12-21 DIAGNOSIS — R9431 Abnormal electrocardiogram [ECG] [EKG]: Secondary | ICD-10-CM | POA: Insufficient documentation

## 2017-12-27 DIAGNOSIS — R972 Elevated prostate specific antigen [PSA]: Secondary | ICD-10-CM | POA: Insufficient documentation

## 2018-01-22 NOTE — Progress Notes (Addendum)
Kyle [name] Date of Birth: 15-Jun-1951 Medical Record #[id number]  History of Present Illness: Kyle [name] is seen for follow up CHF.  He has a history of systolic CHF with EF of 49-17% in May 2012. Coronary anatomy was normal by cath. He has a bicuspid AV with moderate AS/AI. He had moderate pulmonary HTN. He was treated medically and one of my old notes stated that his LV function showed marked improvement with  Echo in 2011 showing improvement in EF to 55-60%.    Unfortunately , he was lost to follow up and was not taking medication for over 2 years. He also has a history of HTN, hyperlipidemia, and PAD s/p right fem-pop BPG.  When seen in October 2015 he had evidence of recurrent CHF with severe HTN. Echo showed an EF of 20-25% with moderate AS/AI. He was started back on medication with lasix, carvedilol, and quinapril. He did not come back for follow up as instructed but states he has been taking his medication. He was seen in Jan. 2016 for surgical clearance for vascular surgery.  He was seen by [name] for claudication and was found to have severe PAD. Revascularization recommended. He underwent aortobifemoral BPG in February 2016. Last seen in December 2017- has missed multiple appointments since then. He did have an Echo in July 2018 showing normalization of LV function with EF 55%. He is now followed by Dr. Starla Link in [facility]. He is now diabetic and on insulin.  He reports he is feeling very well. He denies any chest pain or SOB. No edema or palpitations. Reports he is taking medication as prescribed. BP at primary care has been well controlled. Does admit to smoking an occasional cigarette.  Allergies as of 01/24/2018   No Known Allergies     Medication List        Accurate as of 01/24/18  4:22 PM. Always use your most recent med list.          amLODipine 5 MG tablet Commonly known as:  NORVASC Take 1 tablet (5 mg total) by mouth daily.   aspirin 81 MG chewable  tablet Chew 1 tablet (81 mg total) by mouth daily.   carvedilol 12.5 MG tablet Commonly known as:  COREG Take 1 tablet (12.5 mg total) by mouth 2 (two) times daily with a meal.   furosemide 40 MG tablet Commonly known as:  LASIX Take 1 tablet (40 mg total) by mouth daily.   insulin glargine 100 UNIT/ML injection Commonly known as:  LANTUS Inject 15 Units into the skin at bedtime.   metFORMIN 500 MG tablet Commonly known as:  GLUCOPHAGE Take by mouth 2 (two) times daily with a meal.   oxyCODONE-acetaminophen 5-325 MG tablet Commonly known as:  PERCOCET/ROXICET Take 1-2 tablets by mouth every 4 (four) hours as needed for moderate pain.   pravastatin 40 MG tablet Commonly known as:  PRAVACHOL TAKE 1 TABLET AT BEDTIME   quinapril 10 MG tablet Commonly known as:  ACCUPRIL Take 1 tablet (10 mg total) by mouth daily.        No Known Allergies  Past Medical History:  Diagnosis Date  . Aortic insufficiency    MODERATE WITH A BICUSPID AORTIC VAVLE  . Arterial occlusive disease    MULTILEVEL  . CHF (congestive heart failure) ([place])   . COPD (chronic obstructive pulmonary disease) ([place])   . Dilated cardiomyopathy (HCC)    WITH EJECTION FRACTION DOWN TO 20-25%--WITH CONGESTIVE HEART FAILURE  .  Edema    LOWER EXTREMETIES  . Hypertension   . Normal coronary arteries 2009  . Orthopnea   . Peripheral arterial disease ([place])   . Pulmonary hypertension ([place])   . SOB (shortness of breath)     Past Surgical History:  Procedure Laterality Date  . ABDOMINAL AORTAGRAM N/A 11/05/2014   Procedure: ABDOMINAL Maxcine Ham;  Surgeon: Angelia Mould, MD;  Location: [facility] CATH LAB;  Service: Cardiovascular;  Laterality: N/A;  . AORTA - BILATERAL FEMORAL ARTERY BYPASS GRAFT N/A 12/18/2014   Procedure: AORTOBIFEMORAL BYPASS GRAFT;  Surgeon: Angelia Mould, MD;  Location: [place];  Service: Vascular;  Laterality: N/A;  . CARDIAC CATHETERIZATION  2009   Nl Cors, EF 20%  . COLONOSCOPY     . ENDARTERECTOMY FEMORAL Left 12/18/2014   Procedure: ENDARTERECTOMY FEMORAL;  Surgeon: Angelia Mould, MD;  Location: [place];  Service: Vascular;  Laterality: Left;  . FEMORAL-POPLITEAL BYPASS GRAFT  02/21/2011   right  . OTHER SURGICAL HISTORY     POST RIGHT FEMOROPOPLITEAL BYPASS GRAFT  . PERIPHERAL VASCULAR CATHETERIZATION  11/05/2014   Procedure: LOWER EXTREMITY ANGIOGRAPHY;  Surgeon: Angelia Mould, MD;  Location: [facility] CATH LAB;  Service: Cardiovascular;;    Social History   Socioeconomic History  . Marital status: Married    Spouse name: Not on file  . Number of children: 1  . Years of education: Not on file  . Highest education level: Not on file  Occupational History    Employer: [employer]  Social Needs  . Financial resource strain: Not on file  . Food insecurity:    Worry: Not on file    Inability: Not on file  . Transportation needs:    Medical: Not on file    Non-medical: Not on file  Tobacco Use  . Smoking status: Former Smoker    Packs/day: 1.00  . Smokeless tobacco: Never Used  . Tobacco comment: quit 2010  Substance and Sexual Activity  . Alcohol use: No    Alcohol/week: 0.0 oz  . Drug use: No  . Sexual activity: Not on file  Lifestyle  . Physical activity:    Days per week: Not on file    Minutes per session: Not on file  . Stress: Not on file  Relationships  . Social connections:    Talks on phone: Not on file    Gets together: Not on file    Attends religious service: Not on file    Active member of club or organization: Not on file    Attends meetings of clubs or organizations: Not on file    Relationship status: Not on file  Other Topics Concern  . Not on file  Social History Narrative  . Not on file    Family History  Problem Relation Age of Onset  . Diabetes Mother   . Hyperlipidemia Sister   . Hypertension Sister     Review of Systems: As noted in HPI.   All other systems were reviewed and are  negative.  Physical Exam: BP (!) 176/80   Ht 5\' 11"  (1.803 m)   Wt 164 lb 4 oz (74.5 kg)   BMI 22.91 kg/m  Filed Weights   01/24/18 1615  Weight: 164 lb 4 oz (74.5 kg)    GENERAL:  Well appearing, thin BM in NAD HEENT:  PERRL, EOMI, sclera are clear. Oropharynx is clear. NECK:  No jugular venous distention, carotid upstroke brisk and symmetric, no bruits, no thyromegaly or adenopathy  LUNGS:  Clear to auscultation bilaterally CHEST:  Unremarkable HEART:  RRR,  PMI not displaced or sustained,S1 and S2 within normal limits, no S3, no S4: no clicks, no rubs, no murmurs ABD:  Soft, nontender. BS +, no masses or bruits. No hepatomegaly, no splenomegaly EXT:  2 + pulses throughout, no edema, no cyanosis no clubbing SKIN:  Warm and dry.  No rashes NEURO:  Alert and oriented x 3. Cranial nerves II through XII intact. PSYCH:  Cognitively intact      LABORATORY DATA: . Lab Results  Component Value Date   WBC 7.2 10/16/2016   HGB 17.4 (H) 10/16/2016   HCT 50.5 10/16/2016   PLT 220 10/16/2016   GLUCOSE 157 (H) 10/16/2016   CHOL 149 10/16/2016   TRIG 101 10/16/2016   HDL 36 (L) 10/16/2016   LDLCALC 93 10/16/2016   ALT 38 10/16/2016   AST 36 10/16/2016   NA 141 10/16/2016   K 3.9 10/16/2016   CL 103 10/16/2016   CREATININE 1.14 10/16/2016   BUN 22 (H) 10/16/2016   CO2 27 10/16/2016   TSH 3.244 08/20/2014   INR 1.15 12/18/2014   HGBA1C (H) 10/17/2008    6.7 (NOTE)   The ADA recommends the following therapeutic goal for glycemic   control related to Hgb A1C measurement:   Goal of Therapy:   < 7.0% Hgb A1C   Reference: American Diabetes Association: Clinical Practice   Recommendations 2008, Diabetes Care,  2008, 31:(Suppl 1).    Labs dated 07/28/17: BUN 29, creatinine 1.03. Other chemistries normal. A1c 6.2%. Cholesterol 137, Triglycerides 63, HDL 43, LDL 81, TSH normal. Labs dated 12/21/17: cholesterol 123, triglycerides 64, HDL 41, LDL 69. A1c 6.3%. CMET and CBC normal. TSH  normal.  Ecg today shows NSR with rate 57. LVH. I have personally reviewed and interpreted this study.  Echo: 05/06/17:  Study Conclusions  - Left ventricle: The cavity size was normal. Wall thickness was   increased in a pattern of mild LVH. Systolic function was normal.   The estimated ejection fraction was in the range of 50% to 55%.   Wall motion was normal; there were no regional wall motion   abnormalities. Doppler parameters are consistent with abnormal   left ventricular relaxation (grade 1 diastolic dysfunction). - Aortic valve: There was mild stenosis. There was mild to moderate   regurgitation. - Mitral valve: Calcified annulus. - Left atrium: The atrium was mildly dilated. - Right ventricle: The cavity size was mildly dilated. - Right atrium: The atrium was mildly dilated. - Pulmonary arteries: Systolic pressure was moderately increased.  Impressions:  - Normal LV systolic function; mild LVH; mild diastolic   dysfunction; calcified aortic valve with mild AS (mean gradient   18 mmHg) and mild to moderate AI; mild LAE; mild RAE and RVE;   trace TR with moderately elevated pulmonary pressure.  Assessment / Plan: 1. Chronic systolic CHF.  He had severe cardiomyopathy- nonischemic. EF normalized by last Echo with medical therapy. This has now happened twice. Reinforced importance of compliance with medical therapy and prescriptions are renewed today. Continue low carb, sodium restricted diet.     2. HTN - BP elevated today but has been well controlled on primary care visits. Will continue current Rx.  3. PAD s/p right fem-pop bypass. S/p aortobifemoral BPG Feb. 2016. He did have LE arterial dopplers in March 2017 showing ABI .58 on the right and .48 on the left. Follow up with vascular.   4. Hyperlipidemia.  Refilled Pravastatin 40 mg daily. Cholesterol is well controlled.   5. Biscuspid AV with mild AS/AI.   6. Tobacco abuse- counseled on complete smoking  cessation.  7. DM now on insulin per primary care.

## 2018-01-24 ENCOUNTER — Ambulatory Visit: Payer: Medicare HMO | Admitting: Cardiology

## 2018-01-24 ENCOUNTER — Encounter: Payer: Self-pay | Admitting: Cardiology

## 2018-01-24 VITALS — BP 176/80 | HR 57 | Ht 71.0 in | Wt 164.2 lb

## 2018-01-24 DIAGNOSIS — I739 Peripheral vascular disease, unspecified: Secondary | ICD-10-CM

## 2018-01-24 DIAGNOSIS — I420 Dilated cardiomyopathy: Secondary | ICD-10-CM

## 2018-01-24 DIAGNOSIS — I5022 Chronic systolic (congestive) heart failure: Secondary | ICD-10-CM | POA: Diagnosis not present

## 2018-01-24 DIAGNOSIS — I10 Essential (primary) hypertension: Secondary | ICD-10-CM | POA: Diagnosis not present

## 2018-01-24 MED ORDER — CARVEDILOL 12.5 MG PO TABS: 12.5000 mg | ORAL_TABLET | Freq: Two times a day (BID) | ORAL | 3 refills | Status: DC

## 2018-01-24 MED ORDER — QUINAPRIL HCL 10 MG PO TABS: 10.0000 mg | ORAL_TABLET | Freq: Every day | ORAL | 3 refills | Status: DC

## 2018-01-24 MED ORDER — PRAVASTATIN SODIUM 40 MG PO TABS: ORAL_TABLET | ORAL | 3 refills | Status: DC

## 2018-01-24 MED ORDER — AMLODIPINE BESYLATE 5 MG PO TABS: 5.0000 mg | ORAL_TABLET | Freq: Every day | ORAL | 5 refills | Status: DC

## 2018-01-24 MED ORDER — FUROSEMIDE 40 MG PO TABS: 40.0000 mg | ORAL_TABLET | Freq: Every day | ORAL | 6 refills | Status: DC

## 2018-01-24 NOTE — Patient Instructions (Signed)
Continue your current medication- don't stop it  Quit smoking.  Stay active.  Eat a healthy diet- low in sugar and salt. Low in starchy food.  I will see you in one year

## 2018-01-24 NOTE — Addendum Note (Signed)
Addended by: Kathyrn Lass on: 01/24/2018 04:28 PM   Modules accepted: Orders

## 2018-11-20 ENCOUNTER — Encounter (HOSPITAL_COMMUNITY): Payer: Self-pay | Admitting: *Deleted

## 2018-11-20 ENCOUNTER — Emergency Department (HOSPITAL_COMMUNITY): Payer: Medicare HMO

## 2018-11-20 ENCOUNTER — Inpatient Hospital Stay (HOSPITAL_COMMUNITY)
Admission: EM | Admit: 2018-11-20 | Discharge: 2018-11-30 | DRG: 871 | Disposition: A | Discharge status: Home / Self Care | Payer: Medicare HMO | Attending: Internal Medicine | Admitting: Internal Medicine

## 2018-11-20 ENCOUNTER — Other Ambulatory Visit: Payer: Self-pay

## 2018-11-20 DIAGNOSIS — I110 Hypertensive heart disease with heart failure: Secondary | ICD-10-CM | POA: Diagnosis present

## 2018-11-20 DIAGNOSIS — R296 Repeated falls: Secondary | ICD-10-CM | POA: Diagnosis present

## 2018-11-20 DIAGNOSIS — Z794 Long term (current) use of insulin: Secondary | ICD-10-CM | POA: Diagnosis not present

## 2018-11-20 DIAGNOSIS — J9601 Acute respiratory failure with hypoxia: Secondary | ICD-10-CM | POA: Diagnosis present

## 2018-11-20 DIAGNOSIS — E43 Unspecified severe protein-calorie malnutrition: Secondary | ICD-10-CM | POA: Diagnosis present

## 2018-11-20 DIAGNOSIS — T380X5A Adverse effect of glucocorticoids and synthetic analogues, initial encounter: Secondary | ICD-10-CM | POA: Diagnosis present

## 2018-11-20 DIAGNOSIS — N179 Acute kidney failure, unspecified: Secondary | ICD-10-CM

## 2018-11-20 DIAGNOSIS — Z8249 Family history of ischemic heart disease and other diseases of the circulatory system: Secondary | ICD-10-CM

## 2018-11-20 DIAGNOSIS — J069 Acute upper respiratory infection, unspecified: Secondary | ICD-10-CM | POA: Diagnosis present

## 2018-11-20 DIAGNOSIS — I272 Pulmonary hypertension, unspecified: Secondary | ICD-10-CM | POA: Diagnosis present

## 2018-11-20 DIAGNOSIS — A419 Sepsis, unspecified organism: Secondary | ICD-10-CM | POA: Diagnosis present

## 2018-11-20 DIAGNOSIS — Z7982 Long term (current) use of aspirin: Secondary | ICD-10-CM | POA: Diagnosis not present

## 2018-11-20 DIAGNOSIS — X58XXXA Exposure to other specified factors, initial encounter: Secondary | ICD-10-CM | POA: Diagnosis not present

## 2018-11-20 DIAGNOSIS — I959 Hypotension, unspecified: Secondary | ICD-10-CM

## 2018-11-20 DIAGNOSIS — K7200 Acute and subacute hepatic failure without coma: Secondary | ICD-10-CM | POA: Diagnosis present

## 2018-11-20 DIAGNOSIS — I5043 Acute on chronic combined systolic (congestive) and diastolic (congestive) heart failure: Secondary | ICD-10-CM | POA: Diagnosis present

## 2018-11-20 DIAGNOSIS — I350 Nonrheumatic aortic (valve) stenosis: Secondary | ICD-10-CM | POA: Diagnosis not present

## 2018-11-20 DIAGNOSIS — R197 Diarrhea, unspecified: Secondary | ICD-10-CM | POA: Diagnosis not present

## 2018-11-20 DIAGNOSIS — Z8349 Family history of other endocrine, nutritional and metabolic diseases: Secondary | ICD-10-CM

## 2018-11-20 DIAGNOSIS — Z95820 Peripheral vascular angioplasty status with implants and grafts: Secondary | ICD-10-CM | POA: Diagnosis not present

## 2018-11-20 DIAGNOSIS — I160 Hypertensive urgency: Secondary | ICD-10-CM | POA: Diagnosis present

## 2018-11-20 DIAGNOSIS — I480 Paroxysmal atrial fibrillation: Secondary | ICD-10-CM | POA: Diagnosis present

## 2018-11-20 DIAGNOSIS — N170 Acute kidney failure with tubular necrosis: Secondary | ICD-10-CM | POA: Diagnosis not present

## 2018-11-20 DIAGNOSIS — R748 Abnormal levels of other serum enzymes: Secondary | ICD-10-CM

## 2018-11-20 DIAGNOSIS — R571 Hypovolemic shock: Secondary | ICD-10-CM | POA: Diagnosis present

## 2018-11-20 DIAGNOSIS — R6521 Severe sepsis with septic shock: Secondary | ICD-10-CM | POA: Diagnosis not present

## 2018-11-20 DIAGNOSIS — E1151 Type 2 diabetes mellitus with diabetic peripheral angiopathy without gangrene: Secondary | ICD-10-CM | POA: Diagnosis present

## 2018-11-20 DIAGNOSIS — I4891 Unspecified atrial fibrillation: Secondary | ICD-10-CM | POA: Diagnosis not present

## 2018-11-20 DIAGNOSIS — E785 Hyperlipidemia, unspecified: Secondary | ICD-10-CM | POA: Diagnosis present

## 2018-11-20 DIAGNOSIS — Q231 Congenital insufficiency of aortic valve: Secondary | ICD-10-CM

## 2018-11-20 DIAGNOSIS — J441 Chronic obstructive pulmonary disease with (acute) exacerbation: Secondary | ICD-10-CM | POA: Diagnosis present

## 2018-11-20 DIAGNOSIS — Z6820 Body mass index (BMI) 20.0-20.9, adult: Secondary | ICD-10-CM

## 2018-11-20 DIAGNOSIS — I472 Ventricular tachycardia: Secondary | ICD-10-CM | POA: Diagnosis not present

## 2018-11-20 DIAGNOSIS — E872 Acidosis: Secondary | ICD-10-CM | POA: Diagnosis present

## 2018-11-20 DIAGNOSIS — Z87891 Personal history of nicotine dependence: Secondary | ICD-10-CM

## 2018-11-20 DIAGNOSIS — I952 Hypotension due to drugs: Secondary | ICD-10-CM | POA: Diagnosis not present

## 2018-11-20 DIAGNOSIS — E1165 Type 2 diabetes mellitus with hyperglycemia: Secondary | ICD-10-CM | POA: Diagnosis present

## 2018-11-20 DIAGNOSIS — Z833 Family history of diabetes mellitus: Secondary | ICD-10-CM

## 2018-11-20 DIAGNOSIS — I5022 Chronic systolic (congestive) heart failure: Secondary | ICD-10-CM | POA: Diagnosis present

## 2018-11-20 DIAGNOSIS — Z79899 Other long term (current) drug therapy: Secondary | ICD-10-CM

## 2018-11-20 DIAGNOSIS — I420 Dilated cardiomyopathy: Secondary | ICD-10-CM | POA: Diagnosis present

## 2018-11-20 DIAGNOSIS — I739 Peripheral vascular disease, unspecified: Secondary | ICD-10-CM | POA: Diagnosis present

## 2018-11-20 DIAGNOSIS — R0902 Hypoxemia: Secondary | ICD-10-CM

## 2018-11-20 DIAGNOSIS — R579 Shock, unspecified: Secondary | ICD-10-CM | POA: Diagnosis not present

## 2018-11-20 DIAGNOSIS — I351 Nonrheumatic aortic (valve) insufficiency: Secondary | ICD-10-CM | POA: Diagnosis present

## 2018-11-20 DIAGNOSIS — T17990A Other foreign object in respiratory tract, part unspecified in causing asphyxiation, initial encounter: Secondary | ICD-10-CM | POA: Diagnosis not present

## 2018-11-20 LAB — CBC WITH DIFFERENTIAL/PLATELET
Abs Immature Granulocytes: 0.1 10*3/uL — ABNORMAL HIGH (ref 0.00–0.07)
Basophils Absolute: 0 10*3/uL (ref 0.0–0.1)
Basophils Relative: 0 %
EOS ABS: 0 10*3/uL (ref 0.0–0.5)
Eosinophils Relative: 0 %
HCT: 48 % (ref 39.0–52.0)
Hemoglobin: 14.7 g/dL (ref 13.0–17.0)
Immature Granulocytes: 1 %
Lymphocytes Relative: 29 %
Lymphs Abs: 2 10*3/uL (ref 0.7–4.0)
MCH: 29.5 pg (ref 26.0–34.0)
MCHC: 30.6 g/dL (ref 30.0–36.0)
MCV: 96.4 fL (ref 80.0–100.0)
Monocytes Absolute: 0.8 10*3/uL (ref 0.1–1.0)
Monocytes Relative: 11 %
Neutro Abs: 4.1 10*3/uL (ref 1.7–7.7)
Neutrophils Relative %: 59 %
Platelets: 136 10*3/uL — ABNORMAL LOW (ref 150–400)
RBC: 4.98 MIL/uL (ref 4.22–5.81)
RDW: 18.1 % — ABNORMAL HIGH (ref 11.5–15.5)
WBC: 7 10*3/uL (ref 4.0–10.5)
nRBC: 3 % — ABNORMAL HIGH (ref 0.0–0.2)

## 2018-11-20 LAB — COMPREHENSIVE METABOLIC PANEL
ALK PHOS: 94 U/L (ref 38–126)
ALT: 883 U/L — ABNORMAL HIGH (ref 0–44)
AST: 882 U/L — ABNORMAL HIGH (ref 15–41)
Albumin: 2.7 g/dL — ABNORMAL LOW (ref 3.5–5.0)
Anion gap: 19 — ABNORMAL HIGH (ref 5–15)
BILIRUBIN TOTAL: 0.7 mg/dL (ref 0.3–1.2)
BUN: 60 mg/dL — ABNORMAL HIGH (ref 8–23)
CO2: 17 mmol/L — ABNORMAL LOW (ref 22–32)
Calcium: 8.3 mg/dL — ABNORMAL LOW (ref 8.9–10.3)
Chloride: 105 mmol/L (ref 98–111)
Creatinine, Ser: 2.73 mg/dL — ABNORMAL HIGH (ref 0.61–1.24)
GFR calc Af Amer: 27 mL/min — ABNORMAL LOW (ref >60)
GFR, EST NON AFRICAN AMERICAN: 23 mL/min — AB (ref >60)
Glucose, Bld: 165 mg/dL — ABNORMAL HIGH (ref 70–99)
Potassium: 5 mmol/L (ref 3.5–5.1)
Sodium: 141 mmol/L (ref 135–145)
Total Protein: 5.6 g/dL — ABNORMAL LOW (ref 6.5–8.1)

## 2018-11-20 LAB — CBG MONITORING, ED: Glucose-Capillary: 152 mg/dL — ABNORMAL HIGH (ref 70–99)

## 2018-11-20 LAB — LIPASE, BLOOD: LIPASE: 28 U/L (ref 11–51)

## 2018-11-20 LAB — LACTIC ACID, PLASMA: Lactic Acid, Venous: 4.2 mmol/L (ref 0.5–1.9)

## 2018-11-20 LAB — ETHANOL: Alcohol, Ethyl (B): <10 mg/dL (ref <10)

## 2018-11-20 LAB — TROPONIN I: Troponin I: 3.14 ng/mL (ref <0.03)

## 2018-11-20 MED ORDER — ONDANSETRON HCL 4 MG/2ML IJ SOLN: 4.0000 mg | Freq: Four times a day (QID) | INTRAMUSCULAR | Status: DC | PRN

## 2018-11-20 MED ORDER — VANCOMYCIN HCL IN DEXTROSE 1-5 GM/200ML-% IV SOLN: 1000.0000 mg | Freq: Once | INTRAVENOUS | Status: DC

## 2018-11-20 MED ORDER — PIPERACILLIN-TAZOBACTAM 3.375 G IVPB
3.3750 g | Freq: Three times a day (TID) | INTRAVENOUS | Status: DC
  Administered 2018-11-20 – 2018-11-21 (×2): 3.375 g via INTRAVENOUS
  Filled 2018-11-20 (×3): qty 50

## 2018-11-20 MED ORDER — NOREPINEPHRINE-SODIUM CHLORIDE 4-0.9 MG/250ML-% IV SOLN
0.0000 ug/min | INTRAVENOUS | Status: DC
  Administered 2018-11-20: 4 ug/min via INTRAVENOUS
  Filled 2018-11-20: qty 250

## 2018-11-20 MED ORDER — LACTATED RINGERS IV BOLUS
500.0000 mL | Freq: Once | INTRAVENOUS | Status: AC
  Administered 2018-11-20: 500 mL via INTRAVENOUS

## 2018-11-20 MED ORDER — VANCOMYCIN HCL 10 G IV SOLR: 1500.0000 mg | INTRAVENOUS | Status: DC

## 2018-11-20 MED ORDER — IPRATROPIUM-ALBUTEROL 0.5-2.5 (3) MG/3ML IN SOLN
3.0000 mL | Freq: Four times a day (QID) | RESPIRATORY_TRACT | Status: DC
  Administered 2018-11-21 (×4): 3 mL via RESPIRATORY_TRACT
  Filled 2018-11-20 (×4): qty 3

## 2018-11-20 MED ORDER — VANCOMYCIN HCL 10 G IV SOLR
1500.0000 mg | Freq: Once | INTRAVENOUS | Status: AC
  Administered 2018-11-21: 1500 mg via INTRAVENOUS
  Filled 2018-11-20: qty 1500

## 2018-11-20 MED ORDER — ASPIRIN 300 MG RE SUPP: 300.0000 mg | RECTAL | Status: AC

## 2018-11-20 MED ORDER — SODIUM CHLORIDE 0.9 % IV BOLUS
500.0000 mL | Freq: Once | INTRAVENOUS | Status: AC
  Administered 2018-11-20: 500 mL via INTRAVENOUS

## 2018-11-20 MED ORDER — PIPERACILLIN-TAZOBACTAM 3.375 G IVPB 30 MIN: 3.3750 g | Freq: Once | INTRAVENOUS | Status: DC

## 2018-11-20 MED ORDER — ENOXAPARIN SODIUM 30 MG/0.3ML ~~LOC~~ SOLN
30.0000 mg | SUBCUTANEOUS | Status: DC
  Administered 2018-11-21 – 2018-11-22 (×2): 30 mg via SUBCUTANEOUS
  Filled 2018-11-20 (×2): qty 0.3

## 2018-11-20 MED ORDER — SODIUM CHLORIDE 0.9 % IV BOLUS
1000.0000 mL | Freq: Once | INTRAVENOUS | Status: AC
  Administered 2018-11-20: 1000 mL via INTRAVENOUS

## 2018-11-20 MED ORDER — ASPIRIN 81 MG PO CHEW
324.0000 mg | CHEWABLE_TABLET | ORAL | Status: AC
  Administered 2018-11-20: 324 mg via ORAL
  Filled 2018-11-20: qty 4

## 2018-11-20 NOTE — ED Notes (Signed)
Pt had multiple sticks to get an iv and blood  No vigorous bleeding from any wound  amrs hand and legs very cold to the touch  Pulse ox will not read.  Iv nss added to iv site  No blood was able to get from the site

## 2018-11-20 NOTE — H&P (Signed)
PULMONARY / CRITICAL CARE MEDICINE   Name: Kyle [name] MRN: [id number] DOB: [birth year]    ADMISSION DATE:  11/20/2018 CONSULTATION DATE:  11/20/2018   REFERRING MD :  Lita Mains PRIMARY SERVICE: Critical Care   CHIEF COMPLAINT:  N/V/D, Sputum production   HPI: Kyle [name] is a 68 y.o. male with PMHx concerning for CHF (EF 20-25% that has improved on medical therapy to 50-55% on last echo in 04/2017), moderate pHTN, PAD s/p right fem-pop BPG and aortobifemoral BPG in 2016, who presents to ED after two days worth of progressive malaise associated with N/V/D and sputum production with night sweats and chills the night before admission and syncope the day of admission. He presented with initial evaluation significant for hypotension requiring vasopressor support of levophed peripherally after 2.5L of IVF did not improve his hemodynamics. CXR revealed bibasilar haziness and right heart boarder blurring concerning for potential RML and lower lobe involvement for infectious etiology. CT head was normal. Troponin elevated to 3, although EKG showed no overt changes concerning for ischemia and he denies anginal chest pain or paliptations. Other laboratory abnormalities include lactic acidosis to 4.1, transaminitis to >800 in both AST/ALT without elevated alk phos, AKI with Cr at 2.73 (baseline ~1.0), and anion gap metabolic acidosis. The patient does state to having a sick contact (wife) who has had URI symptoms for the past few days at home prior to his initial symptoms. Denies substance abuse besides "the occasional cigarette" but no illicit drug use or alcohol use. He will be admitted to the ICU for levophed administration and monitoring.  DDx includes sepsis (unclear source but potentials include respiratory as CXR showed changes concerning for PNA, GI with the patient's history of N/V/D, and urinary. Patient has elevated LA with hypotension but no leukocytosis.), Myocardial ischemia (Troponin elevated,  which could be secondary to demand ischemia as EKG did not show ST changes but the patient does have high risk history of vascular disease), and bowel ischemia (least likely given the lack of abdominal pain, however vascular disease with N/V/D and lactic acidosis prompts this to remain potential.)  SIGNIFICANT EVENTS / STUDIES:  -CT head neg -CXR: RML possible involvement, no significant focal abnormalities  -LA: elevated to 4.2  LINES / TUBES: -PIV X 2  CULTURES: -Blood X 2 (1/26)  ANTIBIOTICS: -V/Z 1/27-present    PAST MEDICAL HISTORY :  Past Medical History:  Diagnosis Date  . Aortic insufficiency    MODERATE WITH A BICUSPID AORTIC VAVLE  . Arterial occlusive disease    MULTILEVEL  . CHF (congestive heart failure) ([place])   . COPD (chronic obstructive pulmonary disease) ([place])   . Dilated cardiomyopathy ([facility])    WITH EJECTION FRACTION DOWN TO 20-25%--WITH CONGESTIVE HEART FAILURE  . Edema    LOWER EXTREMETIES  . Hypertension   . Normal coronary arteries 2009  . Orthopnea   . Peripheral arterial disease ([place])   . Pulmonary hypertension ([place])   . SOB (shortness of breath)    Past Surgical History:  Procedure Laterality Date  . ABDOMINAL AORTAGRAM N/A 11/05/2014   Procedure: ABDOMINAL Maxcine Ham;  Surgeon: Angelia Mould, MD;  Location: [facility] CATH LAB;  Service: Cardiovascular;  Laterality: N/A;  . AORTA - BILATERAL FEMORAL ARTERY BYPASS GRAFT N/A 12/18/2014   Procedure: AORTOBIFEMORAL BYPASS GRAFT;  Surgeon: Angelia Mould, MD;  Location: [facility];  Service: Vascular;  Laterality: N/A;  . CARDIAC CATHETERIZATION  2009   Nl Cors, EF 20%  . COLONOSCOPY    .  ENDARTERECTOMY FEMORAL Left 12/18/2014   Procedure: ENDARTERECTOMY FEMORAL;  Surgeon: Angelia Mould, MD;  Location: [facility];  Service: Vascular;  Laterality: Left;  . FEMORAL-POPLITEAL BYPASS GRAFT  02/21/2011   right  . OTHER SURGICAL HISTORY     POST RIGHT FEMOROPOPLITEAL BYPASS GRAFT  . PERIPHERAL  VASCULAR CATHETERIZATION  11/05/2014   Procedure: LOWER EXTREMITY ANGIOGRAPHY;  Surgeon: Angelia Mould, MD;  Location: [facility] CATH LAB;  Service: Cardiovascular;;   Prior to Admission medications   Medication Sig Start Date End Date Taking? Authorizing Provider  amLODipine (NORVASC) 5 MG tablet Take 1 tablet (5 mg total) by mouth daily. 01/24/18  Yes Martinique, Peter M, MD  aspirin 81 MG chewable tablet Chew 1 tablet (81 mg total) by mouth daily. 10/12/16  Yes Martinique, Peter M, MD  carvedilol (COREG) 12.5 MG tablet Take 1 tablet (12.5 mg total) by mouth 2 (two) times daily with a meal. 01/24/18  Yes Martinique, Peter M, MD  furosemide (LASIX) 40 MG tablet Take 1 tablet (40 mg total) by mouth daily. 01/24/18  Yes Martinique, Peter M, MD  insulin glargine (LANTUS) 100 UNIT/ML injection Inject 15 Units into the skin at bedtime.   Yes [provider]  metFORMIN (GLUCOPHAGE) 500 MG tablet Take by mouth 2 (two) times daily with a meal.   Yes [provider]  pravastatin (PRAVACHOL) 40 MG tablet TAKE 1 TABLET AT BEDTIME Patient taking differently: Take 40 mg by mouth at bedtime.  01/24/18  Yes Martinique, Peter M, MD  quinapril (ACCUPRIL) 10 MG tablet Take 1 tablet (10 mg total) by mouth daily. 01/24/18  Yes Martinique, Peter M, MD  oxyCODONE-acetaminophen (PERCOCET/ROXICET) 5-325 MG per tablet Take 1-2 tablets by mouth every 4 (four) hours as needed for moderate pain. Patient not taking: Reported on 01/24/2018 12/24/14   Ulyses Amor, PA-C   No Known Allergies  FAMILY HISTORY:  Family History  Problem Relation Age of Onset  . Diabetes Mother   . Hyperlipidemia Sister   . Hypertension Sister    SOCIAL HISTORY:  reports that he has quit smoking. He smoked 1.00 pack per day. He has never used smokeless tobacco. He reports that he does not drink alcohol or use drugs.  REVIEW OF SYSTEMS:  Full ROS obtained with pertinent negatives/positives detailed in HPI.   SUBJECTIVE: Feeling better after IVF and BP  medications, denies syncope/chest pain/palipitations, coughing but no sputum production.   VITAL SIGNS: Pulse Rate:  [81] 81 (01/26 2305) Resp:  [16-43] 21 (01/26 2320) BP: (68-115)/(45-83) 106/65 (01/26 2320) SpO2:  [100 %] 100 % (01/26 2305) Weight:  [77.1 kg] 77.1 kg (01/26 1850) HEMODYNAMICS:   VENTILATOR SETTINGS:   INTAKE / OUTPUT: Intake/Output      01/26 0701 - 01/27 0700   I.V. (mL/kg) 1000 (13)   Total Intake(mL/kg) 1000 (13)   Net +1000         PHYSICAL EXAMINATION: General: Ill appearing thin gentlemen resting in bed Neuro:  A/O X 4, no CN deficits  HEENT:  Dry MM, no LAD, dental caries present  Cardiovascular:  Distant heart sounds, tachycardia without appreciable m/r/g  Lungs:  Diffuse expiratory wheezing and rhonchi noted in the lower lobes bilaterally. No crackles note.d  Abdomen:  Scaphoid with hyperactive bowel sounds and no guarding or rebounding.  Musculoskeletal:  No edema  Skin:  No lesions or rashes   LABS:  CBC Recent Labs  Lab 11/20/18 1927  WBC 7.0  HGB 14.7  HCT 48.0  PLT  136*   Coag's No results for input(s): APTT, INR in the last 168 hours. BMET Recent Labs  Lab 11/20/18 1927  NA 141  K 5.0  CL 105  CO2 17*  BUN 60*  CREATININE 2.73*  GLUCOSE 165*   Electrolytes Recent Labs  Lab 11/20/18 1927  CALCIUM 8.3*   Sepsis Markers Recent Labs  Lab 11/20/18 1927  LATICACIDVEN 4.2*   ABG No results for input(s): PHART, PCO2ART, PO2ART in the last 168 hours. Liver Enzymes Recent Labs  Lab 11/20/18 1927  AST 882*  ALT 883*  ALKPHOS 94  BILITOT 0.7  ALBUMIN 2.7*   Cardiac Enzymes Recent Labs  Lab 11/20/18 1927  TROPONINI 3.14*   Glucose Recent Labs  Lab 11/20/18 1908  GLUCAP 152*    Imaging Ct Head Wo Contrast  Result Date: 11/20/2018 CLINICAL DATA:  Nausea and vomiting EXAM: CT HEAD WITHOUT CONTRAST TECHNIQUE: Contiguous axial images were obtained from the base of the skull through the vertex without  intravenous contrast. COMPARISON:  None. FINDINGS: Brain: No evidence of acute infarction, hemorrhage, hydrocephalus, extra-axial collection or mass lesion/mass effect. Vascular: No hyperdense vessel or unexpected calcification. Skull: Normal. Negative for fracture or focal lesion. Sinuses/Orbits: No acute finding. Other: None. IMPRESSION: Normal head CT. Electronically Signed   By: Inez Catalina M.D.   On: 11/20/2018 21:25   Dg Chest Portable 1 View  Result Date: 11/20/2018 CLINICAL DATA:  High blood sugar and shortness of breath EXAM: PORTABLE CHEST 1 VIEW COMPARISON:  12/23/2014 FINDINGS: Cardiac shadow is enlarged. Aortic calcifications are noted. The lungs are well aerated bilaterally. No focal infiltrate is seen. Some stable scarring is noted in the bases bilaterally unchanged from the prior exam. No bony abnormality is seen. IMPRESSION: Mild bibasilar scarring.  No acute abnormality is noted. Electronically Signed   By: Inez Catalina M.D.   On: 11/20/2018 21:58     CXR:   Right heart border blurred with increased interstitial markings to bilateral lower lobes.   ASSESSMENT / PLAN:  PULMONARY A: Wife had URI symptoms prior to patient's initial complaints. History of productive cough and chills. CXR with possible RML involvement. No hypoxia but congestion and wheezing noted on exam.  P:   -Supplemental O2 to keep SpO2 >92% only -Duonebs for wheezing ordered -ABx as below for PNA coverage in setting of possible sepsis   CARDIOVASCULAR A: History of CHF that improved with medical therapy. Elevated troponin and hypotension on presentation. EKG without changes concerning for STEMI/NSTEMI. No CP per patient. History of severe PAD  P:  -Trend troponins -EKG PRN  -Cardiology consulted in ED, appreciate assistance  -Holding BP meds in setting of hypotension  -ECHO ordered  -Keep K>4 and Mg>2  -Levophed titrated to keep MAP >70mHg and SBP >90. Will need CVC if continuing to require pressors  after transfer to the unit.   RENAL A:  New AKI, likely pre-renal. Electrolytes WNL. Elevated anion gap metabolic acidosis present: most consistent with LA  P:   -Follow up BMP, Mg, Phos and replace PRN  -IVF PRN  -Urine studies to rule out intrinsic involvement  -Avoid nephrotoxic medications  -LA to be trended   GASTROINTESTINAL A:  Patient with N/V/D but denied abdominal pain prior to admission. AST/ALT>800s with Alk phos WNL. Lipase WNL. Elevated LFTs likely secondary to hypotension.  P:   -Diabetic diet ordered -Will trend LFTs -Maintain optimal BP  -Zofran ordered for N/V PRN    INFECTIOUS A: No clear source identified but  patient meeting sepsis criteria on admission with history concerning for sick contact in the home. No leukocytosis on CBC so low threshold for discontinuation or narrowing of antibiotics with clinical improvement.  P:   -Initiating Vancomycin and Zosyn (renally dosed) -Follow up culture data -Procalcitonin ordered  -Trend LA    NEUROLOGIC A:  Initially stating to having AMS, CT head without changes. Currently no deficits on exam appreciated.  P:   -Periodic neurochecks -Avoid sedative medications   I have personally obtained a history, examined the patient, evaluated laboratory and imaging results, formulated the assessment and plan and placed orders. CRITICAL CARE: The patient is critically ill with multiple organ systems failure and requires high complexity decision making for assessment and support, frequent evaluation and titration of therapies, application of advanced monitoring technologies and extensive interpretation of multiple databases. Critical Care Time devoted to patient care services described in this note is 40 minutes.   Wanda Plump, M.D.  Pulmonary and [place] Pager: [phone]  11/20/2018, 11:33 PM

## 2018-11-20 NOTE — ED Notes (Signed)
Critical care at bedside  Lowering levo-phed per cc doctor order

## 2018-11-20 NOTE — ED Notes (Signed)
Pt sleeping  Waking with non-productive coughing  Mouth swab given

## 2018-11-20 NOTE — ED Triage Notes (Addendum)
The pt came in because this blood sugar is high  He feels nauseated and vomited x 2 today  The pt slightly stumbled whenever  He walked to the stretcher from the w/c  Restless on the bed  No pain anywhere.  His entire body is cold

## 2018-11-20 NOTE — ED Notes (Signed)
Dr. Lita Mains notified of lactic acid of 4.2.

## 2018-11-20 NOTE — ED Notes (Signed)
Pt not restless anymore  Body cold still  No sweating.  He reports he feels better on the levo-phed

## 2018-11-20 NOTE — ED Provider Notes (Signed)
[facility] EMERGENCY DEPARTMENT Provider Note   CSN: [id number] Arrival date & time: 11/20/18  1841     History   Chief Complaint Chief Complaint  Patient presents with  . Hyperglycemia    HPI MICHA DOSANJH is a 68 y.o. male.  HPI Patient with history of diabetes, CHF and COPD.  Last seen normal by family yesterday evening at 6:30 PM when he went to bed.  Has been increasingly confused today.  Had 2 episodes of vomiting.  Has had several falls but denies any head trauma.  No loss of consciousness.  Blood sugars been in the 300s at home.  No recent changes in his medication or missed dosing.  Patient has had cough which is not productive.  Denies any chest pain.  No new lower extremity swelling or pain.  No abdominal pain.  Past Medical History:  Diagnosis Date  . Aortic insufficiency    MODERATE WITH A BICUSPID AORTIC VAVLE  . Arterial occlusive disease    MULTILEVEL  . CHF (congestive heart failure) ([place])   . COPD (chronic obstructive pulmonary disease) ([place])   . Dilated cardiomyopathy (HCC)    WITH EJECTION FRACTION DOWN TO 20-25%--WITH CONGESTIVE HEART FAILURE  . Edema    LOWER EXTREMETIES  . Hypertension   . Normal coronary arteries 2009  . Orthopnea   . Peripheral arterial disease ([place])   . Pulmonary hypertension ([place])   . SOB (shortness of breath)     Patient Active Problem List   Diagnosis Date Noted  . Sepsis ([place]) 11/20/2018  . Protein-calorie malnutrition, severe ([place]) 12/18/2014  . Aortoiliac occlusive disease ([place]) 12/16/2014  . PVD (peripheral vascular disease) ([place]) 10/24/2014  . Aortic stenosis 09/12/2014  . 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 Pulmonary hypertension ([place]) 09/12/2014  . Dyslipidemia 09/12/2014  . Atherosclerosis of native arteries of extremity with intermittent claudication ([place]) 05/11/2012  . Aortic insufficiency   . Hypertension   . Peripheral arterial disease ([place])   . Dilated cardiomyopathy  ([place])   . Chronic systolic CHF (congestive heart failure) (HCC)     Past Surgical History:  Procedure Laterality Date  . ABDOMINAL AORTAGRAM N/A 11/05/2014   Procedure: ABDOMINAL Maxcine Ham;  Surgeon: Angelia Mould, MD;  Location: [facility] CATH LAB;  Service: Cardiovascular;  Laterality: N/A;  . AORTA - BILATERAL FEMORAL ARTERY BYPASS GRAFT N/A 12/18/2014   Procedure: AORTOBIFEMORAL BYPASS GRAFT;  Surgeon: Angelia Mould, MD;  Location: [facility];  Service: Vascular;  Laterality: N/A;  . CARDIAC CATHETERIZATION  2009   Nl Cors, EF 20%  . COLONOSCOPY    . ENDARTERECTOMY FEMORAL Left 12/18/2014   Procedure: ENDARTERECTOMY FEMORAL;  Surgeon: Angelia Mould, MD;  Location: [facility];  Service: Vascular;  Laterality: Left;  . FEMORAL-POPLITEAL BYPASS GRAFT  02/21/2011   right  . OTHER SURGICAL HISTORY     POST RIGHT FEMOROPOPLITEAL BYPASS GRAFT  . PERIPHERAL VASCULAR CATHETERIZATION  11/05/2014   Procedure: LOWER EXTREMITY ANGIOGRAPHY;  Surgeon: Angelia Mould, MD;  Location[facility] C[facility] LAB;  Service: Cardiovascular;;        Home Medications    Prior to Admission medications   Medication Sig Start Date End Date Taking? Authorizing Provider  amLODipine (NORVASC) 5 MG tablet Take 1 tablet (5 mg total) by mouth daily. 01/24/18  Yes Martinique, Peter M, MD  aspirin 81 MG chewable tablet Chew 1 tablet (81 mg total) by mouth daily. 10/12/16  Yes Martinique, Peter M, MD  carvedilol (COREG) 12.5 MG  tablet Take 1 tablet (12.5 mg total) by mouth 2 (two) times daily with a meal. 01/24/18  Yes Martinique, Peter M, MD  furosemide (LASIX) 40 MG tablet Take 1 tablet (40 mg total) by mouth daily. 01/24/18  Yes Martinique, Peter M, MD  insulin glargine (LANTUS) 100 UNIT/ML injection Inject 15 Units into the skin at bedtime.   Yes [provider]  metFORMIN (GLUCOPHAGE) 500 MG tablet Take by mouth 2 (two) times daily with a meal.   Yes [provider]  pravastatin (PRAVACHOL) 40 MG tablet TAKE 1  TABLET AT BEDTIME Patient taking differently: Take 40 mg by mouth at bedtime.  01/24/18  Yes Martinique, Peter M, MD  quinapril (ACCUPRIL) 10 MG tablet Take 1 tablet (10 mg total) by mouth daily. 01/24/18  Yes Martinique, Peter M, MD  oxyCODONE-acetaminophen (PERCOCET/ROXICET) 5-325 MG per tablet Take 1-2 tablets by mouth every 4 (four) hours as needed for moderate pain. Patient not taking: Reported on 01/24/2018 12/24/14   Ulyses Amor, PA-C    Family History Family History  Problem Relation Age of Onset  . Diabetes Mother   . Hyperlipidemia Sister   . Hypertension Sister     Social History Social History   Tobacco Use  . Smoking status: Former Smoker    Packs/day: 1.00  . Smokeless tobacco: Never Used  . Tobacco comment: quit 2010  Substance Use Topics  . Alcohol use: No    Alcohol/week: 0.0 standard drinks  . Drug use: No     Allergies   Patient has no known allergies.   Review of Systems Review of Systems  Constitutional: Positive for chills. Negative for diaphoresis and fever.  HENT: Negative for congestion, sore throat and trouble swallowing.   Eyes: Negative for visual disturbance.  Respiratory: Positive for cough. Negative for shortness of breath.   Cardiovascular: Negative for chest pain, palpitations and leg swelling.  Gastrointestinal: Positive for nausea and vomiting. Negative for abdominal pain, constipation and diarrhea.  Genitourinary: Negative for difficulty urinating, dysuria, flank pain and frequency.  Musculoskeletal: Negative for back pain, myalgias, neck pain and neck stiffness.  Skin: Negative for rash and wound.  Neurological: Positive for weakness. Negative for dizziness, light-headedness, numbness and headaches.  Psychiatric/Behavioral: Positive for confusion.  All other systems reviewed and are negative.    Physical Exam Updated Vital Signs BP 127/74 (BP Location: Right Arm)   Pulse 67   Temp 97.6 F (36.4 C) (Oral)   Resp (!) 25   Ht 5\' 11"   (1.803 m)   Wt 79.1 kg Comment: Transfer to 2W  SpO2 91%   BMI 24.32 kg/m   Physical Exam Vitals signs and nursing note reviewed.  Constitutional:      General: He is not in acute distress.    Appearance: Normal appearance. He is well-developed. He is not ill-appearing.  HENT:     Head: Normocephalic and atraumatic.     Comments: No obvious head trauma.    Nose: Nose normal. No congestion.     Mouth/Throat:     Mouth: Mucous membranes are moist.     Pharynx: No oropharyngeal exudate or posterior oropharyngeal erythema.  Eyes:     Extraocular Movements: Extraocular movements intact.     Pupils: Pupils are equal, round, and reactive to light.  Neck:     Musculoskeletal: Normal range of motion and neck supple. No neck rigidity or muscular tenderness.     Comments: No posterior midline cervical tenderness to palpation.  No meningismus. Cardiovascular:  Rate and Rhythm: Regular rhythm.     Comments: Irregularly irregular Pulmonary:     Effort: Pulmonary effort is normal. No respiratory distress.     Breath sounds: Normal breath sounds. No stridor. No wheezing, rhonchi or rales.  Chest:     Chest wall: No tenderness.  Abdominal:     General: Bowel sounds are normal.     Palpations: Abdomen is soft.     Tenderness: There is no abdominal tenderness. There is no guarding or rebound.  Musculoskeletal: Normal range of motion.        General: No swelling, tenderness, deformity or signs of injury.     Right lower leg: No edema.     Left lower leg: No edema.     Comments: No midline thoracic or lumbar tenderness.  No CVA tenderness.  Distal pulses are 2+.  No lower extremity swelling, asymmetry or tenderness.  Lymphadenopathy:     Cervical: No cervical adenopathy.  Skin:    General: Skin is warm and dry.     Capillary Refill: Capillary refill takes less than 2 seconds.     Findings: No erythema or rash.  Neurological:     General: No focal deficit present.     Mental Status: He  is alert and oriented to person, place, and time.     Comments: Mild confusion.  Cranial nerves II through XII intact.  5/5 motor in bilateral upper and lower extremities.  Finger-nose testing intact.      ED Treatments / Results  Labs (all labs ordered are listed, but only abnormal results are displayed) Labs Reviewed  BLOOD CULTURE ID PANEL (REFLEXED) - Abnormal; Notable for the following components:      Result Value   Streptococcus species DETECTED (*)    All other components within normal limits  CBC WITH DIFFERENTIAL/PLATELET - Abnormal; Notable for the following components:   RDW 18.1 (*)    Platelets 136 (*)    nRBC 3.0 (*)    Abs Immature Granulocytes 0.10 (*)    All other components within normal limits  COMPREHENSIVE METABOLIC PANEL - Abnormal; Notable for the following components:   CO2 17 (*)    Glucose, Bld 165 (*)    BUN 60 (*)    Creatinine, Ser 2.73 (*)    Calcium 8.3 (*)    Total Protein 5.6 (*)    Albumin 2.7 (*)    AST 882 (*)    ALT 883 (*)    GFR calc non Af Amer 23 (*)    GFR calc Af Amer 27 (*)    Anion gap 19 (*)    All other components within normal limits  LACTIC ACID, PLASMA - Abnormal; Notable for the following components:   Lactic Acid, Venous 4.2 (*)    All other components within normal limits  URINALYSIS, ROUTINE W REFLEX MICROSCOPIC - Abnormal; Notable for the following components:   Color, Urine AMBER (*)    APPearance CLOUDY (*)    Protein, ur 30 (*)    Bacteria, UA RARE (*)    All other components within normal limits  TROPONIN I - Abnormal; Notable for the following components:   Troponin I 3.14 (*)    All other components within normal limits  CBC - Abnormal; Notable for the following components:   RDW 17.6 (*)    nRBC 2.3 (*)    All other components within normal limits  CREATININE, SERUM - Abnormal; Notable for the following components:   Creatinine,  Ser 2.54 (*)    GFR calc non Af Amer 25 (*)    GFR calc Af Amer 29 (*)     All other components within normal limits  CBC - Abnormal; Notable for the following components:   RDW 17.7 (*)    nRBC 2.1 (*)    All other components within normal limits  BASIC METABOLIC PANEL - Abnormal; Notable for the following components:   Glucose, Bld 156 (*)    BUN 62 (*)    Creatinine, Ser 2.37 (*)    Calcium 7.5 (*)    GFR calc non Af Amer 28 (*)    GFR calc Af Amer 32 (*)    All other components within normal limits  PHOSPHORUS - Abnormal; Notable for the following components:   Phosphorus 5.9 (*)    All other components within normal limits  BRAIN NATRIURETIC PEPTIDE - Abnormal; Notable for the following components:   B Natriuretic Peptide 709.2 (*)    All other components within normal limits  BLOOD GAS, ARTERIAL - Abnormal; Notable for the following components:   pO2, Arterial 55.3 (*)    Acid-base deficit 3.6 (*)    All other components within normal limits  GLUCOSE, CAPILLARY - Abnormal; Notable for the following components:   Glucose-Capillary 144 (*)    All other components within normal limits  TROPONIN I - Abnormal; Notable for the following components:   Troponin I 2.98 (*)    All other components within normal limits  TROPONIN I - Abnormal; Notable for the following components:   Troponin I 2.82 (*)    All other components within normal limits  TROPONIN I - Abnormal; Notable for the following components:   Troponin I 2.40 (*)    All other components within normal limits  HEMOGLOBIN A1C - Abnormal; Notable for the following components:   Hgb A1c MFr Bld 6.9 (*)    All other components within normal limits  GLUCOSE, CAPILLARY - Abnormal; Notable for the following components:   Glucose-Capillary 154 (*)    All other components within normal limits  GLUCOSE, CAPILLARY - Abnormal; Notable for the following components:   Glucose-Capillary 254 (*)    All other components within normal limits  GLUCOSE, CAPILLARY - Abnormal; Notable for the following  components:   Glucose-Capillary 236 (*)    All other components within normal limits  CBG MONITORING, ED - Abnormal; Notable for the following components:   Glucose-Capillary 152 (*)    All other components within normal limits  CULTURE, BLOOD (ROUTINE X 2)  CULTURE, BLOOD (ROUTINE X 2)  MRSA PCR SCREENING  URINE CULTURE  CULTURE, BLOOD (ROUTINE X 2)  CULTURE, BLOOD (ROUTINE X 2)  LIPASE, BLOOD  ETHANOL  RAPID URINE DRUG SCREEN, HOSP PERFORMED  HIV ANTIBODY (ROUTINE TESTING W REFLEX)  MAGNESIUM  LACTIC ACID, PLASMA  LACTIC ACID, PLASMA  PROCALCITONIN  PROCALCITONIN  CREATININE, URINE, RANDOM  GLUCOSE, CAPILLARY  UREA NITROGEN, URINE  COMPREHENSIVE METABOLIC PANEL  CBC    EKG EKG Interpretation  Date/Time:  Sunday November 20 2018 18:53:53 EST Ventricular Rate:  105 PR Interval:    QRS Duration: 98 QT Interval:  374 QTC Calculation: 461 R Axis:   48 Text Interpretation:  Atrial fibrillation Borderline T abnormalities, inferior leads Confirmed by Julianne Rice [id number]) on 11/20/2018 9:26:52 PM   Radiology Ct Head Wo Contrast  Result Date: 11/20/2018 CLINICAL DATA:  Nausea and vomiting EXAM: CT HEAD WITHOUT CONTRAST TECHNIQUE: Contiguous axial images were obtained from  the base of the skull through the vertex without intravenous contrast. COMPARISON:  None. FINDINGS: Brain: No evidence of acute infarction, hemorrhage, hydrocephalus, extra-axial collection or mass lesion/mass effect. Vascular: No hyperdense vessel or unexpected calcification. Skull: Normal. Negative for fracture or focal lesion. Sinuses/Orbits: No acute finding. Other: None. IMPRESSION: Normal head CT. Electronically Signed   By: Inez Catalina M.D.   On: 11/20/2018 21:25   Dg Chest Portable 1 View  Result Date: 11/20/2018 CLINICAL DATA:  High blood sugar and shortness of breath EXAM: PORTABLE CHEST 1 VIEW COMPARISON:  12/23/2014 FINDINGS: Cardiac shadow is enlarged. Aortic calcifications are noted. The  lungs are well aerated bilaterally. No focal infiltrate is seen. Some stable scarring is noted in the bases bilaterally unchanged from the prior exam. No bony abnormality is seen. IMPRESSION: Mild bibasilar scarring.  No acute abnormality is noted. Electronically Signed   By: Inez Catalina M.D.   On: 11/20/2018 21:58    Procedures Procedures (including critical care time)  Medications Ordered in ED Medications  enoxaparin (LOVENOX) injection 30 mg (30 mg Subcutaneous Given 11/21/18 1202)  ondansetron (ZOFRAN) injection 4 mg (has no administration in time range)  ipratropium-albuterol (DUONEB) 0.5-2.5 (3) MG/3ML nebulizer solution 3 mL (3 mLs Nebulization Given 11/21/18 1959)  insulin aspart (novoLOG) injection 0-9 Units (5 Units Subcutaneous Given 11/21/18 1514)  insulin aspart (novoLOG) injection 0-5 Units (2 Units Subcutaneous Given 11/21/18 2230)  predniSONE (DELTASONE) tablet 40 mg (40 mg Oral Given 11/21/18 1245)  azithromycin (ZITHROMAX) tablet 500 mg (500 mg Oral Given 11/21/18 1245)    Followed by  azithromycin (ZITHROMAX) tablet 250 mg (has no administration in time range)  albuterol (PROVENTIL) (2.5 MG/3ML) 0.083% nebulizer solution 2.5 mg (has no administration in time range)  MEDLINE mouth rinse (has no administration in time range)  sodium chloride 0.9 % bolus 1,000 mL (0 mLs Intravenous Stopped 11/20/18 2004)  sodium chloride 0.9 % bolus 1,000 mL (0 mLs Intravenous Stopped 11/20/18 2159)  sodium chloride 0.9 % bolus 500 mL (0 mLs Intravenous Stopped 11/20/18 2230)  aspirin chewable tablet 324 mg (324 mg Oral Given 11/20/18 2350)    Or  aspirin suppository 300 mg ( Rectal See Alternative 11/20/18 2350)  vancomycin (VANCOCIN) 1,500 mg in sodium chloride 0.9 % 500 mL IVPB (1,500 mg Intravenous New Bag/Given 11/21/18 0021)  lactated ringers bolus 500 mL (500 mLs Intravenous New Bag/Given 11/20/18 2358)  furosemide (LASIX) injection 40 mg (40 mg Intravenous Given 11/21/18 0618)  furosemide  (LASIX) 10 MG/ML injection (  Duplicate 11/03/30 3557)     Initial Impression / Assessment and Plan / ED Course  I have reviewed the triage vital signs and the nursing notes.  Pertinent labs & imaging results that were available during my care of the patient were reviewed by me and considered in my medical decision making (see chart for details).     Patient with AKI and orthostasis.  Question whether this is related to dehydration.  Initiated IV fluids.  Spoke with hospitalist regarding admission for continued rehydration.  Final Clinical Impressions(s) / ED Diagnoses   Final diagnoses:  AKI (acute kidney injury) ([place])  Hypotension, unspecified hypotension type    ED Discharge Orders    None       Julianne Rice, MD 11/21/18 2257

## 2018-11-20 NOTE — Progress Notes (Signed)
Pharmacy Antibiotic Note  HYMEN ARNETT is a 68 y.o. male admitted on 11/20/2018 with sepsis.  Pharmacy has been consulted for vancomycin and zosyn dosing.  Plan: Zosyn 3.375g IV q8h (4 hour infusion).  Vancomycin 1500 mg IV x 1 then 1500 mg IV q48 hours F/u renal function, cultures and clinical course  Height: 5\' 11"  (180.3 cm) Weight: 170 lb (77.1 kg) IBW/kg (Calculated) : 75.3  No data recorded.  Recent Labs  Lab 11/20/18 1927  WBC 7.0  CREATININE 2.73*  LATICACIDVEN 4.2*    Estimated Creatinine Clearance: 28.3 mL/min (A) (by C-G formula based on SCr of 2.73 mg/dL (H)).    No Known Allergies  Thank you for allowing pharmacy to be a part of this patient's care.  Excell Seltzer Poteet 11/20/2018 11:42 PM

## 2018-11-20 NOTE — ED Notes (Signed)
The pt is acting very bizarre  But he answers all questions appropriately

## 2018-11-21 ENCOUNTER — Inpatient Hospital Stay (HOSPITAL_COMMUNITY): Payer: Medicare HMO

## 2018-11-21 DIAGNOSIS — I959 Hypotension, unspecified: Secondary | ICD-10-CM

## 2018-11-21 DIAGNOSIS — R579 Shock, unspecified: Secondary | ICD-10-CM

## 2018-11-21 DIAGNOSIS — I351 Nonrheumatic aortic (valve) insufficiency: Secondary | ICD-10-CM

## 2018-11-21 DIAGNOSIS — I952 Hypotension due to drugs: Secondary | ICD-10-CM

## 2018-11-21 DIAGNOSIS — I4891 Unspecified atrial fibrillation: Secondary | ICD-10-CM

## 2018-11-21 DIAGNOSIS — J441 Chronic obstructive pulmonary disease with (acute) exacerbation: Secondary | ICD-10-CM

## 2018-11-21 DIAGNOSIS — I350 Nonrheumatic aortic (valve) stenosis: Secondary | ICD-10-CM

## 2018-11-21 LAB — BLOOD CULTURE ID PANEL (REFLEXED)
Acinetobacter baumannii: NOT DETECTED
Candida albicans: NOT DETECTED
Candida glabrata: NOT DETECTED
Candida krusei: NOT DETECTED
Candida parapsilosis: NOT DETECTED
Candida tropicalis: NOT DETECTED
Enterobacter cloacae complex: NOT DETECTED
Enterobacteriaceae species: NOT DETECTED
Enterococcus species: NOT DETECTED
Escherichia coli: NOT DETECTED
Haemophilus influenzae: NOT DETECTED
Klebsiella oxytoca: NOT DETECTED
Klebsiella pneumoniae: NOT DETECTED
Listeria monocytogenes: NOT DETECTED
Neisseria meningitidis: NOT DETECTED
PROTEUS SPECIES: NOT DETECTED
Pseudomonas aeruginosa: NOT DETECTED
STAPHYLOCOCCUS AUREUS BCID: NOT DETECTED
STAPHYLOCOCCUS SPECIES: NOT DETECTED
STREPTOCOCCUS SPECIES: DETECTED — AB
Serratia marcescens: NOT DETECTED
Streptococcus agalactiae: NOT DETECTED
Streptococcus pneumoniae: NOT DETECTED
Streptococcus pyogenes: NOT DETECTED

## 2018-11-21 LAB — URINALYSIS, ROUTINE W REFLEX MICROSCOPIC
BILIRUBIN URINE: NEGATIVE
Glucose, UA: NEGATIVE mg/dL
Hgb urine dipstick: NEGATIVE
KETONES UR: NEGATIVE mg/dL
Leukocytes, UA: NEGATIVE
Nitrite: NEGATIVE
Protein, ur: 30 mg/dL — AB
Specific Gravity, Urine: 1.02 (ref 1.005–1.030)
pH: 5 (ref 5.0–8.0)

## 2018-11-21 LAB — PROCALCITONIN
Procalcitonin: 2.08 ng/mL
Procalcitonin: 1.99 ng/mL

## 2018-11-21 LAB — RAPID URINE DRUG SCREEN, HOSP PERFORMED
Amphetamines: NOT DETECTED
Barbiturates: NOT DETECTED
Benzodiazepines: NOT DETECTED
Cocaine: NOT DETECTED
Opiates: NOT DETECTED
Tetrahydrocannabinol: NOT DETECTED

## 2018-11-21 LAB — BLOOD GAS, ARTERIAL
Acid-base deficit: 3.6 mmol/L — ABNORMAL HIGH (ref 0.0–2.0)
Bicarbonate: 21 mmol/L (ref 20.0–28.0)
Drawn by: 511911
O2 Content: 3 L/min
O2 Saturation: 81.9 %
Patient temperature: 98.6
pCO2 arterial: 38.6 mmHg (ref 32.0–48.0)
pH, Arterial: 7.355 (ref 7.350–7.450)
pO2, Arterial: 55.3 mmHg — ABNORMAL LOW (ref 83.0–108.0)

## 2018-11-21 LAB — CBC
HCT: 45 % (ref 39.0–52.0)
HCT: 47.1 % (ref 39.0–52.0)
Hemoglobin: 14.3 g/dL (ref 13.0–17.0)
Hemoglobin: 14.7 g/dL (ref 13.0–17.0)
MCH: 29.6 pg (ref 26.0–34.0)
MCH: 29.7 pg (ref 26.0–34.0)
MCHC: 31.2 g/dL (ref 30.0–36.0)
MCHC: 31.8 g/dL (ref 30.0–36.0)
MCV: 93.6 fL (ref 80.0–100.0)
MCV: 95 fL (ref 80.0–100.0)
Platelets: 183 10*3/uL (ref 150–400)
Platelets: 184 10*3/uL (ref 150–400)
RBC: 4.81 MIL/uL (ref 4.22–5.81)
RBC: 4.96 MIL/uL (ref 4.22–5.81)
RDW: 17.6 % — ABNORMAL HIGH (ref 11.5–15.5)
RDW: 17.7 % — ABNORMAL HIGH (ref 11.5–15.5)
WBC: 7.5 10*3/uL (ref 4.0–10.5)
WBC: 7.7 10*3/uL (ref 4.0–10.5)
nRBC: 2.1 % — ABNORMAL HIGH (ref 0.0–0.2)
nRBC: 2.3 % — ABNORMAL HIGH (ref 0.0–0.2)

## 2018-11-21 LAB — HEMOGLOBIN A1C
Hgb A1c MFr Bld: 6.9 % — ABNORMAL HIGH (ref 4.8–5.6)
Mean Plasma Glucose: 151.33 mg/dL

## 2018-11-21 LAB — GLUCOSE, CAPILLARY
Glucose-Capillary: 144 mg/dL — ABNORMAL HIGH (ref 70–99)
Glucose-Capillary: 91 mg/dL (ref 70–99)
Glucose-Capillary: 154 mg/dL — ABNORMAL HIGH (ref 70–99)
Glucose-Capillary: 254 mg/dL — ABNORMAL HIGH (ref 70–99)
Glucose-Capillary: 236 mg/dL — ABNORMAL HIGH (ref 70–99)

## 2018-11-21 LAB — TROPONIN I
Troponin I: 2.98 ng/mL (ref <0.03)
Troponin I: 2.82 ng/mL (ref <0.03)
Troponin I: 2.4 ng/mL (ref <0.03)

## 2018-11-21 LAB — CREATININE, SERUM
CREATININE: 2.54 mg/dL — AB (ref 0.61–1.24)
GFR calc Af Amer: 29 mL/min — ABNORMAL LOW (ref >60)
GFR calc non Af Amer: 25 mL/min — ABNORMAL LOW (ref >60)

## 2018-11-21 LAB — ECHOCARDIOGRAM COMPLETE
HEIGHTINCHES: 71 in
WEIGHTICAEL: 2398.6 [oz_av]

## 2018-11-21 LAB — BRAIN NATRIURETIC PEPTIDE: B NATRIURETIC PEPTIDE 5: 709.2 pg/mL — AB (ref 0.0–100.0)

## 2018-11-21 LAB — MRSA PCR SCREENING: MRSA by PCR: NEGATIVE

## 2018-11-21 LAB — BASIC METABOLIC PANEL
ANION GAP: 14 (ref 5–15)
BUN: 62 mg/dL — ABNORMAL HIGH (ref 8–23)
CALCIUM: 7.5 mg/dL — AB (ref 8.9–10.3)
CO2: 22 mmol/L (ref 22–32)
Chloride: 107 mmol/L (ref 98–111)
Creatinine, Ser: 2.37 mg/dL — ABNORMAL HIGH (ref 0.61–1.24)
GFR calc Af Amer: 32 mL/min — ABNORMAL LOW (ref >60)
GFR calc non Af Amer: 28 mL/min — ABNORMAL LOW (ref >60)
Glucose, Bld: 156 mg/dL — ABNORMAL HIGH (ref 70–99)
Potassium: 4.5 mmol/L (ref 3.5–5.1)
Sodium: 143 mmol/L (ref 135–145)

## 2018-11-21 LAB — LACTIC ACID, PLASMA
Lactic Acid, Venous: 1.9 mmol/L (ref 0.5–1.9)
Lactic Acid, Venous: 1.7 mmol/L (ref 0.5–1.9)

## 2018-11-21 LAB — MAGNESIUM: Magnesium: 2 mg/dL (ref 1.7–2.4)

## 2018-11-21 LAB — CREATININE, URINE, RANDOM: CREATININE, URINE: 214.29 mg/dL

## 2018-11-21 LAB — HIV ANTIBODY (ROUTINE TESTING W REFLEX): HIV Screen 4th Generation wRfx: NONREACTIVE

## 2018-11-21 LAB — PHOSPHORUS: Phosphorus: 5.9 mg/dL — ABNORMAL HIGH (ref 2.5–4.6)

## 2018-11-21 MED ORDER — PREDNISONE 20 MG PO TABS
40.0000 mg | ORAL_TABLET | Freq: Every day | ORAL | Status: DC
  Administered 2018-11-21 – 2018-11-23 (×3): 40 mg via ORAL
  Filled 2018-11-21 (×3): qty 2

## 2018-11-21 MED ORDER — SODIUM CHLORIDE 0.9 % IV SOLN: 2.0000 g | INTRAVENOUS | Status: DC

## 2018-11-21 MED ORDER — INSULIN ASPART 100 UNIT/ML ~~LOC~~ SOLN
0.0000 [IU] | Freq: Three times a day (TID) | SUBCUTANEOUS | Status: DC
  Administered 2018-11-21: 5 [IU] via SUBCUTANEOUS
  Administered 2018-11-22: 3 [IU] via SUBCUTANEOUS
  Administered 2018-11-22: 1 [IU] via SUBCUTANEOUS
  Administered 2018-11-22: 2 [IU] via SUBCUTANEOUS
  Administered 2018-11-23: 1 [IU] via SUBCUTANEOUS
  Administered 2018-11-23 – 2018-11-24 (×2): 2 [IU] via SUBCUTANEOUS
  Administered 2018-11-24: 5 [IU] via SUBCUTANEOUS
  Administered 2018-11-25 (×2): 3 [IU] via SUBCUTANEOUS
  Administered 2018-11-26 (×2): 1 [IU] via SUBCUTANEOUS
  Administered 2018-11-27: 7 [IU] via SUBCUTANEOUS
  Administered 2018-11-27: 5 [IU] via SUBCUTANEOUS
  Administered 2018-11-27: 3 [IU] via SUBCUTANEOUS

## 2018-11-21 MED ORDER — ALBUTEROL SULFATE (2.5 MG/3ML) 0.083% IN NEBU: 2.5000 mg | INHALATION_SOLUTION | Freq: Four times a day (QID) | RESPIRATORY_TRACT | Status: DC | PRN

## 2018-11-21 MED ORDER — FUROSEMIDE 10 MG/ML IJ SOLN
40.0000 mg | Freq: Once | INTRAMUSCULAR | Status: AC
  Administered 2018-11-21: 40 mg via INTRAVENOUS

## 2018-11-21 MED ORDER — ORAL CARE MOUTH RINSE
15.0000 mL | Freq: Two times a day (BID) | OROMUCOSAL | Status: DC
  Administered 2018-11-22 – 2018-11-30 (×15): 15 mL via OROMUCOSAL

## 2018-11-21 MED ORDER — AZITHROMYCIN 250 MG PO TABS
250.0000 mg | ORAL_TABLET | Freq: Every day | ORAL | Status: DC
  Administered 2018-11-22: 250 mg via ORAL
  Filled 2018-11-21: qty 1

## 2018-11-21 MED ORDER — AZITHROMYCIN 500 MG PO TABS
500.0000 mg | ORAL_TABLET | Freq: Every day | ORAL | Status: AC
  Administered 2018-11-21: 500 mg via ORAL
  Filled 2018-11-21: qty 1

## 2018-11-21 MED ORDER — INSULIN ASPART 100 UNIT/ML ~~LOC~~ SOLN
0.0000 [IU] | Freq: Every day | SUBCUTANEOUS | Status: DC
  Administered 2018-11-21 – 2018-11-29 (×6): 2 [IU] via SUBCUTANEOUS

## 2018-11-21 MED ORDER — FUROSEMIDE 10 MG/ML IJ SOLN
INTRAMUSCULAR | Status: AC
  Filled 2018-11-21: qty 4

## 2018-11-21 NOTE — Progress Notes (Signed)
Progress Note  Patient Name: Kyle [name] Date of Encounter: 11/21/2018  Primary Cardiologist: Dr Martinique  Subjective   No CP or dyspnea  Inpatient Medications    Scheduled Meds: . enoxaparin (LOVENOX) injection  30 mg Subcutaneous Q24H  . ipratropium-albuterol  3 mL Nebulization Q6H   Continuous Infusions: . norepinephrine (LEVOPHED) Adult infusion Stopped (11/21/18 0305)  . piperacillin-tazobactam (ZOSYN)  IV 12.5 mL/hr at 11/21/18 0900  . [START ON 11/23/2018] vancomycin     PRN Meds: ondansetron (ZOFRAN) IV   Vital Signs    Vitals:   11/21/18 0700 11/21/18 0800 11/21/18 0846 11/21/18 0900  BP: (!) 85/72 94/78  (!) 87/69  Pulse: (!) 42 87  91  Resp: 15 18  19   Temp:   98.2 F (36.8 C)   TempSrc:   Oral   SpO2: 97% (!) 84%  90%  Weight:      Height:        Intake/Output Summary (Last 24 hours) at 11/21/2018 1031 Last data filed at 11/21/2018 0900 Gross per 24 hour  Intake 1065.13 ml  Output 500 ml  Net 565.13 ml   Last 3 Weights 11/21/2018 11/20/2018 01/24/2018  Weight (lbs) 149 lb 14.6 oz 170 lb 164 lb 4 oz  Weight (kg) 68 kg 77.111 kg 74.503 kg      Telemetry    Atrial fibrillation with mildly elevated rate - Personally Reviewed   Physical Exam   GEN: No acute distress.   Neck: No JVD Cardiac: irregular Respiratory: Clear to auscultation bilaterally. GI: Soft, nontender, non-distended  MS: No edema Neuro:  Nonfocal  Psych: Normal affect   Labs    Chemistry Recent Labs  Lab 11/20/18 1927 11/20/18 2341 11/21/18 0220  NA 141  --  143  K 5.0  --  4.5  CL 105  --  107  CO2 17*  --  22  GLUCOSE 165*  --  156*  BUN 60*  --  62*  CREATININE 2.73* 2.54* 2.37*  CALCIUM 8.3*  --  7.5*  PROT 5.6*  --   --   ALBUMIN 2.7*  --   --   AST 882*  --   --   ALT 883*  --   --   ALKPHOS 94  --   --   BILITOT 0.7  --   --   GFRNONAA 23* 25* 28*  GFRAA 27* 29* 32*  ANIONGAP 19*  --  14     Hematology Recent Labs  Lab 11/20/18 1927  11/20/18 2341 11/21/18 0220  WBC 7.0 7.7 7.5  RBC 4.98 4.96 4.81  HGB 14.7 14.7 14.3  HCT 48.0 47.1 45.0  MCV 96.4 95.0 93.6  MCH 29.5 29.6 29.7  MCHC 30.6 31.2 31.8  RDW 18.1* 17.6* 17.7*  PLT 136* 184 183    Cardiac Enzymes Recent Labs  Lab 11/20/18 1927 11/21/18 0706  TROPONINI 3.14* 2.98*    BNP Recent Labs  Lab 11/20/18 2341  BNP 709.2*     Radiology    Ct Head Wo Contrast  Result Date: 11/20/2018 CLINICAL DATA:  Nausea and vomiting EXAM: CT HEAD WITHOUT CONTRAST TECHNIQUE: Contiguous axial images were obtained from the base of the skull through the vertex without intravenous contrast. COMPARISON:  None. FINDINGS: Brain: No evidence of acute infarction, hemorrhage, hydrocephalus, extra-axial collection or mass lesion/mass effect. Vascular: No hyperdense vessel or unexpected calcification. Skull: Normal. Negative for fracture or focal lesion. Sinuses/Orbits: No acute finding. Other: None. IMPRESSION: Normal head  CT. Electronically Signed   By: Inez Catalina M.D.   On: 11/20/2018 21:25   Dg Chest Portable 1 View  Result Date: 11/20/2018 CLINICAL DATA:  High blood sugar and shortness of breath EXAM: PORTABLE CHEST 1 VIEW COMPARISON:  12/23/2014 FINDINGS: Cardiac shadow is enlarged. Aortic calcifications are noted. The lungs are well aerated bilaterally. No focal infiltrate is seen. Some stable scarring is noted in the bases bilaterally unchanged from the prior exam. No bony abnormality is seen. IMPRESSION: Mild bibasilar scarring.  No acute abnormality is noted. Electronically Signed   By: Inez Catalina M.D.   On: 11/20/2018 21:58    Patient Profile     68 y.o. male with past medical history of nonischemic cardiomyopathy improved on most recent echocardiogram, peripheral vascular disease admitted with hypotension, possible pneumonia, acute renal insufficiency and elevated liver functions.  Also with atrial fibrillation.  We were asked to see for question cardiac component of  hypotension.  Assessment & Plan    1 hypotension-etiology unclear.  Patient does not appear to be volume overloaded on examination.  Would not diurese further.  Possible sepsis.  Antibiotics per critical care medicine.  Levophed is now off.  Cardiac medications are on hold.  We will await echocardiogram to assess LV function.  2 elevated troponin-troponin mildly elevated but patient denies chest pain and no clear ST changes.  Also occurs in the setting of hypotension and acute renal failure.  Previous catheterization did not show coronary disease.  We will continue to cycle.  If no clear trend would not pursue further ischemia evaluation.  3 atrial fibrillation-newly diagnosed.  Heart rate high normal to mildly increased.  We will add AV nodal blocking agent later as blood pressure improves. CHADSvasc 4.  Will need anticoagulation at discharge.  4 acute renal failure-likely secondary to hypotension.  Continue to follow.  5 elevated liver functions-question shock liver.   6 history of bicuspid aortic valve-await follow-up echocardiogram.  For questions or updates, please contact [place] Please consult [email or web address] for contact info under        Signed, Kirk Ruths, MD  11/21/2018, 10:31 AM

## 2018-11-21 NOTE — Progress Notes (Addendum)
NAME:  Kyle [name], MRN:  [id number], DOB:  02/15/1951, LOS: 1 ADMISSION DATE:  11/20/2018, CONSULTATION DATE:  *11/20/18 REFERRING MD: Lita Mains , CHIEF COMPLAINT:  N/V and generalized malaise.  Brief History   Kyle [name] is a 67 y.o. male with PMHx concerning for CHF (EF 20-25% that has improved on medical therapy to 50-55% on last echo in 04/2017), moderate pHTN, PAD s/p right fem-pop BPG and aortobifemoral BPG in 2016, who presents to ED after two days worth of progressive malaise associated with N/V/D and sputum production with night sweats and chills the night before admission and syncope the day of admission.  History of present illness   Per family patient with increased confusion, 2 episodes of vomiting, several falls but no head trauma, no loss of consciousness.  Blood sugar elevated with hypertension which did not respond to initial fluid resuscitation, requiring pressor support.  Patient continue to take all his meds which include multiple antihypertensives. In ED he was found to have hypertension, AKI, troponin elevated to 3.14 without any acute EKG changes, transaminitis, lactic acidosis and chest x-ray with possible right heart border silhouetting.  He was afebrile with no leukocytosis.  Past Medical History   . Aortic insufficiency    MODERATE WITH A BICUSPID AORTIC VAVLE  . Arterial occlusive disease    MULTILEVEL  . CHF (congestive heart failure) ([place])   . COPD (chronic obstructive pulmonary disease) ([place])   . Dilated cardiomyopathy (HCC)    WITH EJECTION FRACTION DOWN TO 20-25%--WITH CONGESTIVE HEART FAILURE  . Edema    LOWER EXTREMETIES  . Hypertension   . Normal coronary arteries 2009  . Orthopnea   . Peripheral arterial disease ([place])   . Pulmonary hypertension ([place])   . SOB (shortness of breath)     Significant Hospital Events   Admitted to ICU with pressors.  Consults:  Cardiology  Procedures:    Significant Diagnostic Tests:  Ct  Head Wo Contrast  Result Date: 11/20/2018 CLINICAL DATA:  Nausea and vomiting EXAM: CT HEAD WITHOUT CONTRAST TECHNIQUE: Contiguous axial images were obtained from the base of the skull through the vertex without intravenous contrast. COMPARISON:  None. FINDINGS: Brain: No evidence of acute infarction, hemorrhage, hydrocephalus, extra-axial collection or mass lesion/mass effect. Vascular: No hyperdense vessel or unexpected calcification. Skull: Normal. Negative for fracture or focal lesion. Sinuses/Orbits: No acute finding. Other: None. IMPRESSION: Normal head CT. Electronically Signed   By: Inez Catalina M.D.   On: 11/20/2018 21:25   Dg Chest Portable 1 View  Result Date: 11/20/2018 CLINICAL DATA:  High blood sugar and shortness of breath EXAM: PORTABLE CHEST 1 VIEW COMPARISON:  12/23/2014 FINDINGS: Cardiac shadow is enlarged. Aortic calcifications are noted. The lungs are well aerated bilaterally. No focal infiltrate is seen. Some stable scarring is noted in the bases bilaterally unchanged from the prior exam. No bony abnormality is seen. IMPRESSION: Mild bibasilar scarring.  No acute abnormality is noted. Electronically Signed   By: Inez Catalina M.D.   On: 11/20/2018 21:58   ECHO. Study Conclusions  - Left ventricle: The cavity size was normal. Systolic function was mildly to moderately reduced. The estimated ejection fraction was in the range of 40% to 45%. Diffuse hypokinesis. The study is not technically sufficient to allow evaluation of LV diastolic function. - Ventricular septum: The contour showed diastolic flattening. - Aortic valve: Valve mobility was restricted. There was mild stenosis. There was moderate regurgitation. - Right ventricle: The cavity size was moderately dilated. Wall  thickness was normal. Systolic function was moderately reduced. - Right atrium: The atrium was severely dilated. - Tricuspid valve: There was moderate regurgitation. - Pulmonary arteries: Systolic pressure  was moderately increased. PA peak pressure: 53 mm Hg (S).  Impressions: - Compared to the prior study, there has been no significant interval change.   Micro Data:    Antimicrobials:  Zosyn. 1/26>> Vancomycin.1/26>>  Interim history/subjective:  Was feeling better when seen this morning.  Denies any chest pain or shortness of breath.  Patient was denying any upper respiratory symptoms.  He was just feeling weak and dizzy for the past couple of days and continue to take all his meds.  Wife was sick with cold last week.  Objective   Blood pressure (!) 87/69, pulse 91, temperature 98.2 F (36.8 C), temperature source Oral, resp. rate 19, height 5\' 11"  (1.803 m), weight 68 kg, SpO2 90 %.        Intake/Output Summary (Last 24 hours) at 11/21/2018 1056 Last data filed at 11/21/2018 0900 Gross per 24 hour  Intake 1065.13 ml  Output 500 ml  Net 565.13 ml   Filed Weights   11/20/18 1850 11/21/18 0136  Weight: 77.1 kg 68 kg    Examination: General: Well-developed gentleman, in no acute distress. HENT: AT,EOMI Lungs: Scattered expiratory wheeze. Cardiovascular: Irregular rhythm with normal rate, no murmurs. Abdomen: Soft, nontender, nondistended, bowel sounds positive. Extremities: Mildly cold lower extremities, no edema or cyanosis, pulses intact and symmetrical. Neuro: Alert and oriented x3, strength and sensations intact and symmetrical.  Resolved Hospital Problem list     Assessment & Plan:  Kyle [name] is a 68 y.o. male with PMHx concerning for CHF (EF 20-25% that has improved on medical therapy to 50-55% on last echo in 04/2017), moderate pHTN, PAD s/p right fem-pop BPG and aortobifemoral BPG in 2016, who presents to ED after two days worth of progressive malaise associated with N/V/D and sputum production with night sweats and chills the night before admission and syncope the day of admission. Hypotension/cardiogenic shock/sepsis/elevated troponin/CHF/NICM. Most likely  hypotension due to poor p.o. intake and continue to take antihypertensives.  Off the pressors now.  Lactic acidosis has been resolved. Remained afebrile with no leukocytosis. Echo 11/21/18: Reduced ejection fraction at 40 to 45% with diffuse hypokinesia, mild AS with moderate AR, dilated right atrium and ventricle with increased PA pressure of 53. Cardiology is following-appreciate their recommendations, no plan for any further ischemic work-up as previous echo with normal coronaries. Has an history of fluctuating ejection fraction, it was 50 to 55% in 2018 with lowest up to 20 to 25% in the past. -Holding home antihypertensives. -Trending troponin.  Got a call from lab that patient blood culture is growing strep viridans 1 out of 4 bottles.  Most likely a contaminant, we will repeat cultures in the morning. If patient become febrile or deteriorated we will start him on ceftriaxone.   Atrial fibrillation.  New onset. CHADSvasc 4.  Currently rate in low 100, per cardiology to add nodal blocker once blood pressure improves. -Will need anticoagulation on discharge.  COPD exacerbation/URI.  Not much upper respiratory symptoms, positive sick contact.  Pretty wheezy on exam. Having some scattered wheeze with history of COPD-no pulmonary function on record, lifelong heavy smoker. -Continue DuoNeb. -Treat as COPD exacerbation with prednisone and azithromycin for 5 days.  AKI.  Creatinine at 2.37 this morning with baseline of 1.  Most likely secondary to decreased perfusion. -Monitor renal function. -Avoid nephrotoxic.  Transaminitis.  May be shock liver. -Monitor hepatic function.  Diabetes.  CBG between 91-152. -SSI   Best practice:  Diet: Heart healthy/carb modified Pain/Anxiety/Delirium protocol (if indicated): NA VAP protocol (if indicated): NA DVT prophylaxis: Lovenox GI prophylaxis: NA Glucose control: SSI Mobility: As tolerated Code Status: Full Family Communication: Patient and  wife was updated. Disposition: Stable enough to be transferred to stepdown.  Labs   CBC: Recent Labs  Lab 11/20/18 1927 11/20/18 2341 11/21/18 0220  WBC 7.0 7.7 7.5  NEUTROABS 4.1  --   --   HGB 14.7 14.7 14.3  HCT 48.0 47.1 45.0  MCV 96.4 95.0 93.6  PLT 136* 184 010    Basic Metabolic Panel: Recent Labs  Lab 11/20/18 1927 11/20/18 2341 11/21/18 0220  NA 141  --  143  K 5.0  --  4.5  CL 105  --  107  CO2 17*  --  22  GLUCOSE 165*  --  156*  BUN 60*  --  62*  CREATININE 2.73* 2.54* 2.37*  CALCIUM 8.3*  --  7.5*  MG  --   --  2.0  PHOS  --   --  5.9*   GFR: Estimated Creatinine Clearance: 29.5 mL/min (A) (by C-G formula based on SCr of 2.37 mg/dL (H)). Recent Labs  Lab 11/20/18 1927 11/20/18 2341 11/21/18 0220 11/21/18 0558  PROCALCITON  --  2.08 1.99  --   WBC 7.0 7.7 7.5  --   LATICACIDVEN 4.2*  --  1.9 1.7    Liver Function Tests: Recent Labs  Lab 11/20/18 1927  AST 882*  ALT 883*  ALKPHOS 94  BILITOT 0.7  PROT 5.6*  ALBUMIN 2.7*   Recent Labs  Lab 11/20/18 1927  LIPASE 28   No results for input(s): AMMONIA in the last 168 hours.  ABG    Component Value Date/Time   PHART 7.355 11/21/2018 0246   PCO2ART 38.6 11/21/2018 0246   PO2ART 55.3 (L) 11/21/2018 0246   HCO3 21.0 11/21/2018 0246   TCO2 25 12/18/2014 1551   ACIDBASEDEF 3.6 (H) 11/21/2018 0246   O2SAT 81.9 11/21/2018 0246     Coagulation Profile: No results for input(s): INR, PROTIME in the last 168 hours.  Cardiac Enzymes: Recent Labs  Lab 11/20/18 1927 11/21/18 0706  TROPONINI 3.14* 2.98*    HbA1C: Hgb A1c MFr Bld  Date/Time Value Ref Range Status  10/17/2008 01:30 AM (H) 4.6 - 6.1 % Final   6.7 (NOTE)   The ADA recommends the following therapeutic goal for glycemic   control related to Hgb A1C measurement:   Goal of Therapy:   < 7.0% Hgb A1C   Reference: American Diabetes Association: Clinical Practice   Recommendations 2008, Diabetes Care,  2008, 31:(Suppl 1).     CBG: Recent Labs  Lab 11/20/18 1908 11/21/18 0322 11/21/18 0845  GLUCAP 152* 144* 91    Review of Systems:   Negative except mentioned earlier.  Past Medical History  He,  has a past medical history of Aortic insufficiency, Arterial occlusive disease, CHF (congestive heart failure) ([place]), COPD (chronic obstructive pulmonary disease) ([place]), Dilated cardiomyopathy ([place]), Edema, Hypertension, Normal coronary arteries (2009), Orthopnea, Peripheral arterial disease ([name]), Pulmonary hypertension ([place]), and SOB (shortness of breath).   Surgical History    Past Surgical History:  Procedure Laterality Date  . ABDOMINAL AORTAGRAM N/A 11/05/2014   Procedure: ABDOMINAL Maxcine Ham;  Surgeon: Angelia Mould, MD;  Location: [facility] CATH LAB;  Service: Cardiovascular;  Laterality: N/A;  .  AORTA - BILATERAL FEMORAL ARTERY BYPASS GRAFT N/A 12/18/2014   Procedure: AORTOBIFEMORAL BYPASS GRAFT;  Surgeon: Angelia Mould, MD;  Location: [facility];  Service: Vascular;  Laterality: N/A;  . CARDIAC CATHETERIZATION  2009   Nl Cors, EF 20%  . COLONOSCOPY    . ENDARTERECTOMY FEMORAL Left 12/18/2014   Procedure: ENDARTERECTOMY FEMORAL;  Surgeon: Angelia Mould, MD;  Location: [facility];  Service: Vascular;  Laterality: Left;  . FEMORAL-POPLITEAL BYPASS GRAFT  02/21/2011   right  . OTHER SURGICAL HISTORY     POST RIGHT FEMOROPOPLITEAL BYPASS GRAFT  . PERIPHERAL VASCULAR CATHETERIZATION  11/05/2014   Procedure: LOWER EXTREMITY ANGIOGRAPHY;  Surgeon: Angelia Mould, MD;  Location: [facility] CATH LAB;  Service: Cardiovascular;;     Social History   reports that he has quit smoking. He smoked 1.00 pack per day. He has never used smokeless tobacco. He reports that he does not drink alcohol or use drugs.   Family History   His family history includes Diabetes in his mother; Hyperlipidemia in his sister; Hypertension in his sister.   Allergies No Known Allergies   Home Medications  Prior to Admission  medications   Medication Sig Start Date End Date Taking? Authorizing Provider  amLODipine (NORVASC) 5 MG tablet Take 1 tablet (5 mg total) by mouth daily. 01/24/18  Yes Martinique, Peter M, MD  aspirin 81 MG chewable tablet Chew 1 tablet (81 mg total) by mouth daily. 10/12/16  Yes [place]ique, Peter M, MD  carvedilol (COREG) 12.5 MG tablet Take 1 tablet (12.5 mg total) by mouth 2 (two) times daily with a meal. 01/24/18  Yes [place]ique, Peter M, MD  furosemide (LASIX) 40 MG tablet Take 1 tablet (40 mg total) by mouth daily. 01/24/18  Yes [place]ique, Peter M, MD  insulin glargine (LANTUS) 100 UNIT/ML injection Inject 15 Units into the skin at bedtime.   Yes [provider]  metFORMIN (GLUCOPHAGE) 500 MG tablet Take by mouth 2 (two) times daily with a meal.   Yes [provider]  pravastatin (PRAVACHOL) 40 MG tablet TAKE 1 TABLET AT BEDTIME Patient taking differently: Take 40 mg by mouth at bedtime.  01/24/18  Yes Martinique, Peter M, MD  quinapril (ACCUPRIL) 10 MG tablet Take 1 tablet (10 mg total) by mouth daily. 01/24/18  Yes Martinique, Peter M, MD  oxyCODONE-acetaminophen (PERCOCET/ROXICET) 5-325 MG per tablet Take 1-2 tablets by mouth every 4 (four) hours as needed for moderate pain. Patient not taking: Reported on 01/24/2018 12/24/14   Ulyses Amor, PA-C     Critical care time:      Lorella Nimrod MD PGY3 Pager [phone]

## 2018-11-21 NOTE — Consult Note (Signed)
Cardiology Consultation   Patient ID: Kyle [name]; [id number]; Aug 21, 1951   Admit date: 11/20/2018 Date of Consult: 11/21/2018  Referring Provider:  Helayne Seminole  Primary Care Provider: Alonna Buckler, MD Cardiologist: Martin[place]  Reason for Consultation: hypotension  History of Present Illness: Kyle [name] is a 68 y.o. male who is being seen today for the evaluation of hypotension and concern for possible cardiogenic shock at the request of the ED. Pt has h/o NICM and follows w/ Dr. Martinique; he has h/o EF as low as 20-25%, and has been as high as 50-55%. It seems his LVEF has fluctuated significantly over the past few years and was most recently 50-55% in July of 2018. He had LHC in 2012 that showed normal cors. He has h/o PAD and is s/p R fem-pop BPG. There is a suggestion in the cardiology notes that his CM may be due to uncontrolled HTN and medication non-compliance. In addition to NICM, he also has h/o BAV w/ mild AS/AI on last TTE in 2018. Tonight he comes in w/ somewhat vague sx. Family indicates pt's mental status has not been normal.  Pt states he has had two days worth of progressive malaise associated with N/V/D and sputum production with night sweats and chills the night before admission and syncope the day of admission. He presented with initial evaluation significant for hypotension requiring vasopressor support of levophed peripherally after 2.5L of IVF did not improve his hemodynamics. CXR revealed bibasilar haziness and right heart boarder blurring concerning for potential RML and lower lobe involvement for infectious etiology. CT head was normal. Troponin elevated to 3, although EKG showed no overt changes concerning for ischemia and he denies anginal chest pain or paliptations. Other laboratory abnormalities include lactic acidosis to 4.1, transaminitis to >800 in both AST/ALT without elevated alk phos, AKI with Cr at 2.73 (baseline ~1.0), and anion gap metabolic  acidosis.  Pt is in afib which is apparently a new diagnosis. HR in low 100s  Past Medical History:  Diagnosis Date  . Aortic insufficiency    MODERATE WITH A BICUSPID AORTIC VAVLE  . Arterial occlusive disease    MULTILEVEL  . CHF (congestive heart failure) ([place])   . COPD (chronic obstructive pulmonary disease) ([place])   . Dilated cardiomyopathy (HCC)    WITH EJECTION FRACTION DOWN TO 20-25%--WITH CONGESTIVE HEART FAILURE  . Edema    LOWER EXTREMETIES  . Hypertension   . Normal coronary arteries 2009  . Orthopnea   . Peripheral arterial disease ([place])   . Pulmonary hypertension ([place])   . SOB (shortness of breath)     Past Surgical History:  Procedure Laterality Date  . ABDOMINAL AORTAGRAM N/A 11/05/2014   Procedure: ABDOMINAL Maxcine Ham;  Surgeon: Angelia Mould, MD;  Location: [facility] CATH LAB;  Service: Cardiovascular;  Laterality: N/A;  . AORTA - BILATERAL FEMORAL ARTERY BYPASS GRAFT N/A 12/18/2014   Procedure: AORTOBIFEMORAL BYPASS GRAFT;  Surgeon: Angelia Mould, MD;  Location: [facility];  Service: Vascular;  Laterality: N/A;  . CARDIAC CATHETERIZATION  2009   Nl Cors, EF 20%  . COLONOSCOPY    . ENDARTERECTOMY FEMORAL Left 12/18/2014   Procedure: ENDARTERECTOMY FEMORAL;  Surgeon: Angelia Mould, MD;  Location: [place];  Service: Vascular;  Laterality: Left;  . FEMORAL-POPLITEAL BYPASS GRAFT  02/21/2011   right  . OTHER SURGICAL HISTORY     POST RIGHT FEMOROPOPLITEAL BYPASS GRAFT  . PERIPHERAL VASCULAR CATHETERIZATION  11/05/2014   Procedure: LOWER EXTREMITY ANGIOGRAPHY;  Surgeon: Angelia Mould, MD;  Location: [facility] CATH LAB;  Service: Cardiovascular;;      Current Medications: . enoxaparin (LOVENOX) injection  30 mg Subcutaneous Q24H  . ipratropium-albuterol  3 mL Nebulization Q6H    Infused Medications: . lactated ringers 500 mL (11/20/18 2358)  . norepinephrine (LEVOPHED) Adult infusion 3 mcg/min (11/21/18 0032)  . piperacillin-tazobactam (ZOSYN)  IV  Stopped (11/21/18 0018)  . vancomycin 1,500 mg (11/21/18 0021)  . [START ON 11/23/2018] vancomycin      PRN Medications: ondansetron (ZOFRAN) IV   Allergies:   No Known Allergies  Social History:   The patient  reports that he has quit smoking. He smoked 1.00 pack per day. He has never used smokeless tobacco. He reports that he does not drink alcohol or use drugs.    Family History:   The patient's family history includes Diabetes in his mother; Hyperlipidemia in his sister; Hypertension in his sister.   ROS:  Please see the history of present illness.  All other ROS reviewed and negative.     Vital Signs: Blood pressure 108/76, pulse 81, resp. rate 18, height '5\' 11"'$  (1.803 m), weight 77.1 kg, SpO2 100 %.   PHYSICAL EXAM: General:  Well nourished, well developed, in no acute distress HEENT: normal Lymph: no adenopathy Neck: JVD to angle of mandible Endocrine:  No thryomegaly Vascular: No carotid bruits; DP pulses not palpable Cardiac:  normal S1, S2; RRR; no murmur. No significant edema; ext are cool Lungs:  Coarse BS bilaterally at the bases Abd: soft, nontender, no hepatomegaly  Ext: no edema Musculoskeletal:  No deformities Skin: distal extremities are cool, skin dry. Neuro:  CNs 2-12 intact, no focal abnormalities noted Psych:  Normal affect   EKG:  Afib, HR 105  Labs: Recent Labs    11/20/18 1927  TROPONINI 3.14*   No results for input(s): TROPIPOC in the last 72 hours.  Lab Results  Component Value Date   WBC 7.7 11/20/2018   HGB 14.7 11/20/2018   HCT 47.1 11/20/2018   MCV 95.0 11/20/2018   PLT 184 11/20/2018   Recent Labs  Lab 11/20/18 1927 11/20/18 2341  NA 141  --   K 5.0  --   CL 105  --   CO2 17*  --   BUN 60*  --   CREATININE 2.73* 2.54*  CALCIUM 8.3*  --   PROT 5.6*  --   BILITOT 0.7  --   ALKPHOS 94  --   ALT 883*  --   AST 882*  --   GLUCOSE 165*  --    Lab Results  Component Value Date   CHOL 149 10/16/2016   HDL 36 (L)  10/16/2016   LDLCALC 93 10/16/2016   TRIG 101 10/16/2016   No results found for: DDIMER  Radiology/Studies:  Ct Head Wo Contrast  Result Date: 11/20/2018 CLINICAL DATA:  Nausea and vomiting EXAM: CT HEAD WITHOUT CONTRAST TECHNIQUE: Contiguous axial images were obtained from the base of the skull through the vertex without intravenous contrast. COMPARISON:  None. FINDINGS: Brain: No evidence of acute infarction, hemorrhage, hydrocephalus, extra-axial collection or mass lesion/mass effect. Vascular: No hyperdense vessel or unexpected calcification. Skull: Normal. Negative for fracture or focal lesion. Sinuses/Orbits: No acute finding. Other: None. IMPRESSION: Normal head CT. Electronically Signed   By: Inez Catalina M.D.   On: 11/20/2018 21:25   Dg Chest Portable 1 View  Result Date: 11/20/2018 CLINICAL DATA:  High blood sugar and shortness of breath  EXAM: PORTABLE CHEST 1 VIEW COMPARISON:  12/23/2014 FINDINGS: Cardiac shadow is enlarged. Aortic calcifications are noted. The lungs are well aerated bilaterally. No focal infiltrate is seen. Some stable scarring is noted in the bases bilaterally unchanged from the prior exam. No bony abnormality is seen. IMPRESSION: Mild bibasilar scarring.  No acute abnormality is noted. Electronically Signed   By: Inez Catalina M.D.   On: 11/20/2018 21:58   TTE 05-06-17  Echo: 05/06/17:  Study Conclusions  - Left ventricle: The cavity size was normal. Wall thickness was increased in a pattern of mild LVH. Systolic function was normal. The estimated ejection fraction was in the range of 50% to 55%. Wall motion was normal; there were no regional wall motion abnormalities. Doppler parameters are consistent with abnormal left ventricular relaxation (grade 1 diastolic dysfunction). - Aortic valve: There was mild stenosis. There was mild to moderate regurgitation. - Mitral valve: Calcified annulus. - Left atrium: The atrium was mildly dilated. - Right  ventricle: The cavity size was mildly dilated. - Right atrium: The atrium was mildly dilated. - Pulmonary arteries: Systolic pressure was moderately increased.  Impressions:  - Normal LV systolic function; mild LVH; mild diastolic dysfunction; calcified aortic valve with mild AS (mean gradient 18 mmHg) and mild to moderate AI; mild LAE; mild RAE and RVE; trace TR with moderately elevated pulmonary pressure.  ASSESSMENT AND PLAN:  1. Hypotension/shock: etiology somewhat unclear. Last EF 50-55% in 2018. Could certainly be some component of cardiogenic shock if pt's LVEF has again dropped into the 20s. He has ARF, elevated LFTs, and cool extremities, all of which could be c/w cardiogenic etiology w/ hypoperfusion of both kidneys and liver. Sepsis however is also a possibility. PA catheter might be helpful to clear up etiology of pt's decompensated state. For now, would try to avoid giving IVF to worsen volume status as he appears to be volume-up clinically. Ok to cont to use levophed to support BP if necessary. Would try cautious diuresis if possible. Given hypotension, if cardiogenic shock suspected, dobutamine is probably best choice for inotropic support.  Will get repeat TTE to reassess LV function. Consider advanced HF consult if EF is down again. Cont to cycle enzymes (trop abnormal initially), but pt not stable enough for LHC at this time. No acute EKG changes other than afib.  2. Afib: presumably new-onset; HR is in low 100s. Cannot use BB given hypotension. If HR becomes an issue, amiodarone would be best choice to try to control rate. Would recommend A/C with heparin gtt if not contraindicated; pt will need long-term [place] at discharge.  3. BAV/AS/AI: repeat TTE as above  Thank you for the opportunity to participate in the care of this patient.  For questions or updates, please contact [place] Please consult [email or web address] for contact info under   Signed, Rudean Curt, MD, [facility]  11/21/2018 12:36 AM

## 2018-11-21 NOTE — ED Notes (Signed)
Report given ton on 66m

## 2018-11-21 NOTE — Progress Notes (Signed)
PHARMACY - PHYSICIAN COMMUNICATION CRITICAL VALUE ALERT - BLOOD CULTURE IDENTIFICATION (BCID)  Kyle [name] is an 68 y.o. male who presented to [facility] on 11/20/2018 with a chief complaint of N/V/D and malaise  Assessment:  Currently afebrile with normal WBC. Being treated for CAP. 1/4 BCx with strep species not detected on BCID. Likely viridans group strep. Discussed with MD and patient is clinically improved.   Name of physician (or Provider) Contacted: Dr. Reesa Chew  Current antibiotics: Azithromycin   Changes to prescribed antibiotics recommended: Continue Azithromycin for CAP and reculture.  Patient is on recommended antibiotics - No changes needed  Results for orders placed or performed during the hospital encounter of 11/20/18  Blood Culture ID Panel (Reflexed) (Collected: 11/20/2018  7:35 PM)  Result Value Ref Range   Enterococcus species NOT DETECTED NOT DETECTED   Listeria monocytogenes NOT DETECTED NOT DETECTED   Staphylococcus species NOT DETECTED NOT DETECTED   Staphylococcus aureus (BCID) NOT DETECTED NOT DETECTED   Streptococcus species DETECTED (A) NOT DETECTED   Streptococcus agalactiae NOT DETECTED NOT DETECTED   Streptococcus pneumoniae NOT DETECTED NOT DETECTED   Streptococcus pyogenes NOT DETECTED NOT DETECTED   Acinetobacter baumannii NOT DETECTED NOT DETECTED   Enterobacteriaceae species NOT DETECTED NOT DETECTED   Enterobacter cloacae complex NOT DETECTED NOT DETECTED   Escherichia coli NOT DETECTED NOT DETECTED   Klebsiella oxytoca NOT DETECTED NOT DETECTED   Klebsiella pneumoniae NOT DETECTED NOT DETECTED   Proteus species NOT DETECTED NOT DETECTED   Serratia marcescens NOT DETECTED NOT DETECTED   Haemophilus influenzae NOT DETECTED NOT DETECTED   Neisseria meningitidis NOT DETECTED NOT DETECTED   Pseudomonas aeruginosa NOT DETECTED NOT DETECTED   Candida albicans NOT DETECTED NOT DETECTED   Candida glabrata NOT DETECTED NOT DETECTED   Candida krusei NOT  DETECTED NOT DETECTED   Candida parapsilosis NOT DETECTED NOT DETECTED   Candida tropicalis NOT DETECTED NOT DETECTED    Albertina Parr, PharmD., BCPS Clinical Pharmacist Clinical phone for 11/21/18 until 10:30pm: [phone] If after 10:30pm, please refer to [facility] for unit-specific pharmacist

## 2018-11-21 NOTE — Progress Notes (Signed)
  Echocardiogram 2D Echocardiogram has been performed.  Jennette Dubin 11/21/2018, 9:29 AM

## 2018-11-22 ENCOUNTER — Other Ambulatory Visit: Payer: Self-pay

## 2018-11-22 ENCOUNTER — Inpatient Hospital Stay (HOSPITAL_COMMUNITY): Payer: Medicare HMO

## 2018-11-22 DIAGNOSIS — I5022 Chronic systolic (congestive) heart failure: Secondary | ICD-10-CM

## 2018-11-22 DIAGNOSIS — N170 Acute kidney failure with tubular necrosis: Secondary | ICD-10-CM

## 2018-11-22 DIAGNOSIS — R6521 Severe sepsis with septic shock: Secondary | ICD-10-CM

## 2018-11-22 DIAGNOSIS — I480 Paroxysmal atrial fibrillation: Secondary | ICD-10-CM

## 2018-11-22 DIAGNOSIS — A419 Sepsis, unspecified organism: Principal | ICD-10-CM

## 2018-11-22 LAB — COMPREHENSIVE METABOLIC PANEL
ALBUMIN: 2.7 g/dL — AB (ref 3.5–5.0)
ALK PHOS: 103 U/L (ref 38–126)
ALT: 1006 U/L — ABNORMAL HIGH (ref 0–44)
AST: 740 U/L — ABNORMAL HIGH (ref 15–41)
Anion gap: 12 (ref 5–15)
BILIRUBIN TOTAL: 0.9 mg/dL (ref 0.3–1.2)
BUN: 62 mg/dL — ABNORMAL HIGH (ref 8–23)
CO2: 24 mmol/L (ref 22–32)
Calcium: 8 mg/dL — ABNORMAL LOW (ref 8.9–10.3)
Chloride: 105 mmol/L (ref 98–111)
Creatinine, Ser: 1.91 mg/dL — ABNORMAL HIGH (ref 0.61–1.24)
GFR calc Af Amer: 41 mL/min — ABNORMAL LOW (ref >60)
GFR calc non Af Amer: 36 mL/min — ABNORMAL LOW (ref >60)
Glucose, Bld: 195 mg/dL — ABNORMAL HIGH (ref 70–99)
Potassium: 4.8 mmol/L (ref 3.5–5.1)
SODIUM: 141 mmol/L (ref 135–145)
Total Protein: 5.7 g/dL — ABNORMAL LOW (ref 6.5–8.1)

## 2018-11-22 LAB — CBC
HCT: 46.4 % (ref 39.0–52.0)
Hemoglobin: 14.6 g/dL (ref 13.0–17.0)
MCH: 29.6 pg (ref 26.0–34.0)
MCHC: 31.5 g/dL (ref 30.0–36.0)
MCV: 93.9 fL (ref 80.0–100.0)
PLATELETS: 190 10*3/uL (ref 150–400)
RBC: 4.94 MIL/uL (ref 4.22–5.81)
RDW: 17.6 % — ABNORMAL HIGH (ref 11.5–15.5)
WBC: 4.3 10*3/uL (ref 4.0–10.5)
nRBC: 1.9 % — ABNORMAL HIGH (ref 0.0–0.2)

## 2018-11-22 LAB — URINE CULTURE: Culture: NO GROWTH

## 2018-11-22 LAB — UREA NITROGEN, URINE: Urea Nitrogen, Ur: 637 mg/dL

## 2018-11-22 LAB — GLUCOSE, CAPILLARY
Glucose-Capillary: 127 mg/dL — ABNORMAL HIGH (ref 70–99)
Glucose-Capillary: 176 mg/dL — ABNORMAL HIGH (ref 70–99)
Glucose-Capillary: 242 mg/dL — ABNORMAL HIGH (ref 70–99)
Glucose-Capillary: 161 mg/dL — ABNORMAL HIGH (ref 70–99)

## 2018-11-22 MED ORDER — IPRATROPIUM-ALBUTEROL 0.5-2.5 (3) MG/3ML IN SOLN
3.0000 mL | Freq: Three times a day (TID) | RESPIRATORY_TRACT | Status: DC
  Administered 2018-11-22 – 2018-11-26 (×13): 3 mL via RESPIRATORY_TRACT
  Filled 2018-11-22 (×13): qty 3

## 2018-11-22 MED ORDER — SODIUM CHLORIDE 0.9 % IV SOLN
2.0000 g | INTRAVENOUS | Status: DC
  Administered 2018-11-22 – 2018-11-23 (×2): 2 g via INTRAVENOUS
  Filled 2018-11-22 (×2): qty 20

## 2018-11-22 MED ORDER — SODIUM CHLORIDE 0.9 % IV SOLN
INTRAVENOUS | Status: AC
  Administered 2018-11-22: 13:00:00 via INTRAVENOUS

## 2018-11-22 NOTE — Progress Notes (Signed)
Progress Note  Patient Name: Kyle [name] Date of Encounter: 11/22/2018  Primary Cardiologist: Dr Martinique  Subjective   Pt denies CP or dyspnea  Inpatient Medications    Scheduled Meds: . enoxaparin (LOVENOX) injection  30 mg Subcutaneous Q24H  . insulin aspart  0-5 Units Subcutaneous QHS  . insulin aspart  0-9 Units Subcutaneous TID WC  . ipratropium-albuterol  3 mL Nebulization TID  . mouth rinse  15 mL Mouth Rinse BID  . predniSONE  40 mg Oral Q breakfast   Continuous Infusions: . cefTRIAXone (ROCEPHIN)  IV     PRN Meds: albuterol, ondansetron (ZOFRAN) IV   Vital Signs    Vitals:   11/22/18 0324 11/22/18 0500 11/22/18 0744 11/22/18 0752  BP: 108/65  132/80   Pulse: 66  (!) 59 69  Resp: 16  (!) 21 (!) 27  Temp: (!) 97.5 F (36.4 C)  97.9 F (36.6 C)   TempSrc: Oral  Oral   SpO2: 93%  (!) 88% 90%  Weight:  79.1 kg    Height:        Intake/Output Summary (Last 24 hours) at 11/22/2018 1008 Last data filed at 11/22/2018 0850 Gross per 24 hour  Intake 608.79 ml  Output 1350 ml  Net -741.21 ml   Last 3 Weights 11/22/2018 11/21/2018 11/21/2018  Weight (lbs) 174 lb 6.1 oz 174 lb 6.1 oz 149 lb 14.6 oz  Weight (kg) 79.1 kg 79.1 kg 68 kg      Telemetry    Atrial fibrillation; rate controlled - Personally Reviewed   Physical Exam   GEN: No acute distress.  WD/WN Neck: supple Cardiac: irregular, no murmur Respiratory: CTA GI: Soft, NT/ND MS: No edema Neuro:  Grossly intact   Labs    Chemistry Recent Labs  Lab 11/20/18 1927 11/20/18 2341 11/21/18 0220 11/22/18 0210  NA 141  --  143 141  K 5.0  --  4.5 4.8  CL 105  --  107 105  CO2 17*  --  22 24  GLUCOSE 165*  --  156* 195*  BUN 60*  --  62* 62*  CREATININE 2.73* 2.54* 2.37* 1.91*  CALCIUM 8.3*  --  7.5* 8.0*  PROT 5.6*  --   --  5.7*  ALBUMIN 2.7*  --   --  2.7*  AST 882*  --   --  740*  ALT 883*  --   --  1,006*  ALKPHOS 94  --   --  103  BILITOT 0.7  --   --  0.9  GFRNONAA 23* 25*  28* 36*  GFRAA 27* 29* 32* 41*  ANIONGAP 19*  --  14 12     Hematology Recent Labs  Lab 11/20/18 2341 11/21/18 0220 11/22/18 0210  WBC 7.7 7.5 4.3  RBC 4.96 4.81 4.94  HGB 14.7 14.3 14.6  HCT 47.1 45.0 46.4  MCV 95.0 93.6 93.9  MCH 29.6 29.7 29.6  MCHC 31.2 31.8 31.5  RDW 17.6* 17.7* 17.6*  PLT 184 183 190    Cardiac Enzymes Recent Labs  Lab 11/20/18 1927 11/21/18 0706 11/21/18 1144 11/21/18 1833  TROPONINI 3.14* 2.98* 2.82* 2.40*    BNP Recent Labs  Lab 11/20/18 2341  BNP 709.2*     Radiology    Ct Head Wo Contrast  Result Date: 11/20/2018 CLINICAL DATA:  Nausea and vomiting EXAM: CT HEAD WITHOUT CONTRAST TECHNIQUE: Contiguous axial images were obtained from the base of the skull through the vertex without intravenous  contrast. COMPARISON:  None. FINDINGS: Brain: No evidence of acute infarction, hemorrhage, hydrocephalus, extra-axial collection or mass lesion/mass effect. Vascular: No hyperdense vessel or unexpected calcification. Skull: Normal. Negative for fracture or focal lesion. Sinuses/Orbits: No acute finding. Other: None. IMPRESSION: Normal head CT. Electronically Signed   By: Inez Catalina M.D.   On: 11/20/2018 21:25   Dg Chest Portable 1 View  Result Date: 11/20/2018 CLINICAL DATA:  High blood sugar and shortness of breath EXAM: PORTABLE CHEST 1 VIEW COMPARISON:  12/23/2014 FINDINGS: Cardiac shadow is enlarged. Aortic calcifications are noted. The lungs are well aerated bilaterally. No focal infiltrate is seen. Some stable scarring is noted in the bases bilaterally unchanged from the prior exam. No bony abnormality is seen. IMPRESSION: Mild bibasilar scarring.  No acute abnormality is noted. Electronically Signed   By: Inez Catalina M.D.   On: 11/20/2018 21:58    Patient Profile     68 y.o. male with past medical history of nonischemic cardiomyopathy improved on most recent echocardiogram, peripheral vascular disease admitted with hypotension, possible  pneumonia, acute renal insufficiency and elevated liver functions.  Also with atrial fibrillation.  We were asked to see for question cardiac component of hypotension.  Assessment & Plan    1 hypotension-resolved; etiology unclear.  Appears to potentially have had a viral syndrome with decreased p.o. intake, nausea and diarrhea at time of admission.  Likely dehydrated.  Pressors have been weaned to off.  Continue to hold cardiac medications for now.  P.o. intake has now improved.  Echocardiogram shows ejection fraction 40 to 45%, mild aortic stenosis and moderate aortic insufficiency.  Unchanged compared to previous.  Note 1 blood culture positive for gram-positive cocci in chains.  Final ID pending.  Patient on antibiotics per primary care.  2 elevated troponin-troponin was mildly elevated but patient has not had chest pain and there were no clear ST changes at time of admission.  This also occurred in the setting of hypotension and acute renal failure.  Previous catheterization revealed no coronary disease.  No plans for further ischemia evaluation at this point.  Could consider nuclear study following discharge.    3 atrial fibrillation-patient is back in sinus rhythm.  Will resume carvedilol later as blood pressure allows.  CHADSvasc 4.  Will need anticoagulation at discharge.  4 acute renal failure-likely secondary to hypotension and dehydration.  Continue to follow.  5 elevated liver functions-possibly related to shock liver.  Will need to continue to follow.  6 history of bicuspid aortic valve-mild aortic stenosis and moderate aortic insufficiency on echocardiogram.  Will need follow-up as an outpatient.  For questions or updates, please contact [place] Please consult [email or web address] for contact info under        Signed, Kirk Ruths, MD  11/22/2018, 10:08 AM

## 2018-11-22 NOTE — Progress Notes (Addendum)
PROGRESS NOTE    Kyle [name]  MWN:[id number] DOB: [birth year] DOA: 11/20/2018 PCP: Alonna Buckler, MD  Brief Narrative: 68 year old male with history of nonischemic cardiomyopathy, moderate pulmonary hypertension, COPD, PAD status post femoropopliteal bypass and aortobifemoral bypass in 2016 was admitted to the ICU on 1/26 with hypotension, malaise, nausea vomiting cough congestion night sweats chills and syncope the day of admission. -Treated with antibiotics, fluid resuscitation and pressors -Also being followed by cardiology for atrial fibrillation and cardiomyopathy -Transferred from PCCM to [facility] service today  Assessment & Plan:   Shock -Resolved, per PCCM probably hypovolemic shock due to nausea vomiting decreased oral intake -Treated with fluid resuscitation and pressors in ICU -Improved -Blood cultures on admission 1/26, 1 set with gram-positive cocci in chains, could be strep viridans, group C etc. -Could have been septic shock, did give history of cough congestion chills and night sweats prior to admission, repeat chest x-ray today -Start IV ceftriaxone, discontinue azithromycin -Follow-up repeat blood cultures from 1/27 -Follow-up sensitivities and will discuss with infectious disease  Hypoxia/COPD exacerbation -Not checked for flu on admission, reportedly with URI symptoms, positive sick contacts on admission to ICU -Repeat chest x-ray -Check influenza PCR -Antibiotics as above, add duo nebs, steroid taper  Acute kidney injury -Likely due to hypoperfusion/hypotension, creatinine improving was 2.7 on admission, down to 1.9 now -Baseline creatinine is 1, gentle hydration for 8 more hours  Diabetes mellitus  Transaminitis -Probably shock liver, LFTs improving, he was hypotensive on admission  Dilated cardiomyopathy/chronic systolic CHF/pulmonary hypertension -ECHO with EF of 40 to 45%, mild left ear and moderate AR, unchanged from prior -No coronary disease  disease based on prior cath -Cardiac meds on hold due to hypotension, hopefully restart in 1 to 2 days  Paroxysmal atrial fibrillation -Back in sinus rhythm now, carvedilol on hold with hypotension, restart in 1 to 2 days -Will need anticoagulation at discharge as LFTs continue to improve  History of bicuspid aortic valve, mild AS, moderate AR -Outpatient follow-up  DVT prophylaxis: Lovenox Code Status: Full code Family Communication: No family at bedside Disposition Plan: Home pending clinical improvement in respiratory status, bacteremia etc.  Consultants:   Cardiology  PCCM   Procedures:   Antimicrobials:    Subjective: -Feels better, improving, not back to baseline yet still quite weak and tired, no further fevers or chills but reports feeling feverish prior to admission  Objective: Vitals:   11/22/18 0324 11/22/18 0500 11/22/18 0744 11/22/18 0752  BP: 108/65  132/80   Pulse: 66  (!) 59 69  Resp: 16  (!) 21 (!) 27  Temp: (!) 97.5 F (36.4 C)  97.9 F (36.6 C)   TempSrc: Oral  Oral   SpO2: 93%  (!) 88% 90%  Weight:  79.1 kg    Height:        Intake/Output Summary (Last 24 hours) at 11/22/2018 1146 Last data filed at 11/22/2018 0850 Gross per 24 hour  Intake 608.79 ml  Output 1350 ml  Net -741.21 ml   Filed Weights   11/21/18 0136 11/21/18 2122 11/22/18 0500  Weight: 68 kg 79.1 kg 79.1 kg    Examination:  General exam: Appears calm and comfortable, distress Respiratory system: Poor air movement, no wheezes today, decreased breath sounds at both bases Cardiovascular system: S1 & S2 heard, RRR.  Gastrointestinal system: Abdomen is nondistended, soft and nontender.Normal bowel sounds heard. Central nervous system: Alert and oriented. No focal neurological deficits. Extremities: No edema Skin: No rashes, lesions or  ulcers Psychiatry: Judgement and insight appear normal. Mood & affect appropriate.     Data Reviewed:   CBC: Recent Labs  Lab  11/20/18 1927 11/20/18 2341 11/21/18 0220 11/22/18 0210  WBC 7.0 7.7 7.5 4.3  NEUTROABS 4.1  --   --   --   HGB 14.7 14.7 14.3 14.6  HCT 48.0 47.1 45.0 46.4  MCV 96.4 95.0 93.6 93.9  PLT 136* 184 183 161   Basic Metabolic Panel: Recent Labs  Lab 11/20/18 1927 11/20/18 2341 11/21/18 0220 11/22/18 0210  NA 141  --  143 141  K 5.0  --  4.5 4.8  CL 105  --  107 105  CO2 17*  --  22 24  GLUCOSE 165*  --  156* 195*  BUN 60*  --  62* 62*  CREATININE 2.73* 2.54* 2.37* 1.91*  CALCIUM 8.3*  --  7.5* 8.0*  MG  --   --  2.0  --   PHOS  --   --  5.9*  --    GFR: Estimated Creatinine Clearance: 40.5 mL/min (A) (by C-G formula based on SCr of 1.91 mg/dL (H)). Liver Function Tests: Recent Labs  Lab 11/20/18 1927 11/22/18 0210  AST 882* 740*  ALT 883* 1,006*  ALKPHOS 94 103  BILITOT 0.7 0.9  PROT 5.6* 5.7*  ALBUMIN 2.7* 2.7*   Recent Labs  Lab 11/20/18 1927  LIPASE 28   No results for input(s): AMMONIA in the last 168 hours. Coagulation Profile: No results for input(s): INR, PROTIME in the last 168 hours. Cardiac Enzymes: Recent Labs  Lab 11/20/18 1927 11/21/18 0706 11/21/18 1144 11/21/18 1833  TROPONINI 3.14* 2.98* 2.82* 2.40*   BNP (last 3 results) No results for input(s): PROBNP in the last 8760 hours. HbA1C: Recent Labs    11/21/18 1144  HGBA1C 6.9*   CBG: Recent Labs  Lab 11/21/18 0845 11/21/18 1105 11/21/18 1502 11/21/18 2215 11/22/18 0740  GLUCAP 91 154* 254* 236* 127*   Lipid Profile: No results for input(s): CHOL, HDL, LDLCALC, TRIG, CHOLHDL, LDLDIRECT in the last 72 hours. Thyroid Function Tests: No results for input(s): TSH, T4TOTAL, FREET4, T3FREE, THYROIDAB in the last 72 hours. Anemia Panel: No results for input(s): VITAMINB12, FOLATE, FERRITIN, TIBC, IRON, RETICCTPCT in the last 72 hours. Urine analysis:    Component Value Date/Time   COLORURINE AMBER (A) 11/21/2018 0211   APPEARANCEUR CLOUDY (A) 11/21/2018 0211   LABSPEC 1.020  11/21/2018 0211   PHURINE 5.0 11/21/2018 0211   GLUCOSEU NEGATIVE 11/21/2018 0211   HGBUR NEGATIVE 11/21/2018 0211   BILIRUBINUR NEGATIVE 11/21/2018 0211   KETONESUR NEGATIVE 11/21/2018 0211   PROTEINUR 30 (A) 11/21/2018 0211   UROBILINOGEN 0.2 12/19/2014 0821   NITRITE NEGATIVE 11/21/2018 0211   LEUKOCYTESUR NEGATIVE 11/21/2018 0211   Sepsis Labs: @LABRCNTIP (procalcitonin:4,lacticidven:4)  ) Recent Results (from the past 240 hour(s))  Culture, blood (Routine X 2) w Reflex to ID Panel     Status: None (Preliminary result)   Collection Time: 11/20/18  7:35 PM  Result Value Ref Range Status   Specimen Description BLOOD RIGHT ANTECUBITAL  Final   Special Requests   Final    BOTTLES DRAWN AEROBIC AND ANAEROBIC Blood Culture results may not be optimal due to an inadequate volume of blood received in culture bottles   Culture  Setup Time   Final    GRAM POSITIVE COCCI IN CHAINS AEROBIC BOTTLE ONLY CRITICAL RESULT CALLED TO, READ BACK BY AND VERIFIED WITH: Asencion Islam PharmD 17:20 11/21/18 (  [email or web address]) Performed at [facility], [place] [address] [place], [place] [address]    Culture GRAM POSITIVE COCCI  Final   Report Status PENDING  Incomplete  Blood Culture ID Panel (Reflexed)     Status: Abnormal   Collection Time: 11/20/18  7:35 PM  Result Value Ref Range Status   Enterococcus species NOT DETECTED NOT DETECTED Final   Listeria monocytogenes NOT DETECTED NOT DETECTED Final   Staphylococcus species NOT DETECTED NOT DETECTED Final   Staphylococcus aureus (BCID) NOT DETECTED NOT DETECTED Final   Streptococcus species DETECTED (A) NOT DETECTED Final    Comment: Not Enterococcus species, Streptococcus agalactiae, Streptococcus pyogenes, or Streptococcus pneumoniae. CRITICAL RESULT CALLED TO, READ BACK BY AND VERIFIED WITH: Asencion Islam PharmD 17:20 11/21/18 ([email or web address])    Streptococcus agalactiae NOT DETECTED NOT DETECTED Final   Streptococcus pneumoniae NOT DETECTED NOT DETECTED  Final   Streptococcus pyogenes NOT DETECTED NOT DETECTED Final   Acinetobacter baumannii NOT DETECTED NOT DETECTED Final   Enterobacteriaceae species NOT DETECTED NOT DETECTED Final   Enterobacter cloacae complex NOT DETECTED NOT DETECTED Final   Escherichia coli NOT DETECTED NOT DETECTED Final   Klebsiella oxytoca NOT DETECTED NOT DETECTED Final   Klebsiella pneumoniae NOT DETECTED NOT DETECTED Final   Proteus species NOT DETECTED NOT DETECTED Final   Serratia marcescens NOT DETECTED NOT DETECTED Final   Haemophilus influenzae NOT DETECTED NOT DETECTED Final   Neisseria meningitidis NOT DETECTED NOT DETECTED Final   Pseudomonas aeruginosa NOT DETECTED NOT DETECTED Final   Candida albicans NOT DETECTED NOT DETECTED Final   Candida glabrata NOT DETECTED NOT DETECTED Final   Candida krusei NOT DETECTED NOT DETECTED Final   Candida parapsilosis NOT DETECTED NOT DETECTED Final   Candida tropicalis NOT DETECTED NOT DETECTED Final    Comment: Performed at [facility], [place] [address], [place], [place] [address]  Culture, blood (Routine X 2) w Reflex to ID Panel     Status: None (Preliminary result)   Collection Time: 11/20/18  7:50 PM  Result Value Ref Range Status   Specimen Description BLOOD LEFT HAND  Final   Special Requests   Final    BOTTLES DRAWN AEROBIC AND ANAEROBIC Blood Culture results may not be optimal due to an inadequate volume of blood received in culture bottles   Culture   Final    NO GROWTH 2 DAYS Performed at [facility], [place] [address] [place], [place] [address]    Report Status PENDING  Incomplete  MRSA PCR Screening     Status: None   Collection Time: 11/21/18  1:01 AM  Result Value Ref Range Status   MRSA by PCR NEGATIVE NEGATIVE Final    Comment:        The GeneXpert MRSA Assay (FDA approved for NASAL specimens only), is one component of a comprehensive MRSA colonization surveillance program. It is not intended to diagnose MRSA infection nor  to guide or monitor treatment for MRSA infections. Performed at [facility], [place] [address] [place], [place] [address]   Culture, Urine     Status: None   Collection Time: 11/21/18  2:11 AM  Result Value Ref Range Status   Specimen Description URINE, RANDOM  Final   Special Requests NONE  Final   Culture   Final    NO GROWTH Performed at [facility], [place] [address] [place], [place] [address]    Report Status 11/22/2018 FINAL  Final  Culture,  blood (routine x 2)     Status: None (Preliminary result)   Collection Time: 11/21/18  9:41 PM  Result Value Ref Range Status   Specimen Description BLOOD BLOOD RIGHT FOREARM  Final   Special Requests   Final    BOTTLES DRAWN AEROBIC ONLY Blood Culture adequate volume   Culture   Final    NO GROWTH < 12 HOURS Performed at [facility], [address], [place], [place] [address]    Report Status PENDING  Incomplete  Culture, blood (routine x 2)     Status: None (Preliminary result)   Collection Time: 11/21/18  9:41 PM  Result Value Ref Range Status   Specimen Description BLOOD BLOOD LEFT FOREARM  Final   Special Requests   Final    BOTTLES DRAWN AEROBIC ONLY Blood Culture adequate volume   Culture   Final    NO GROWTH < 12 HOURS Performed at [facility], [place] [address] [place], [place] [address]    Report Status PENDING  Incomplete         Radiology Studies: Dg Chest 2 View  Result Date: 11/22/2018 CLINICAL DATA:  Hypoxia. EXAM: CHEST - 2 VIEW COMPARISON:  Radiograph of November 20, 2018. FINDINGS: Stable cardiomegaly. No pneumothorax is noted. Left lung is clear. Minimal right basilar subsegmental atelectasis is noted with minimal right pleural effusion. Bony thorax is unremarkable. IMPRESSION: Minimal right basilar subsegmental atelectasis with minimal right pleural effusion. Electronically Signed   By: Marijo Conception, M.D.   On: 11/22/2018 10:12   Ct Head Wo Contrast  Result Date:  11/20/2018 CLINICAL DATA:  Nausea and vomiting EXAM: CT HEAD WITHOUT CONTRAST TECHNIQUE: Contiguous axial images were obtained from the base of the skull through the vertex without intravenous contrast. COMPARISON:  None. FINDINGS: Brain: No evidence of acute infarction, hemorrhage, hydrocephalus, extra-axial collection or mass lesion/mass effect. Vascular: No hyperdense vessel or unexpected calcification. Skull: Normal. Negative for fracture or focal lesion. Sinuses/Orbits: No acute finding. Other: None. IMPRESSION: Normal head CT. Electronically Signed   By: Inez Catalina M.D.   On: 11/20/2018 21:25   Dg Chest Portable 1 View  Result Date: 11/20/2018 CLINICAL DATA:  High blood sugar and shortness of breath EXAM: PORTABLE CHEST 1 VIEW COMPARISON:  12/23/2014 FINDINGS: Cardiac shadow is enlarged. Aortic calcifications are noted. The lungs are well aerated bilaterally. No focal infiltrate is seen. Some stable scarring is noted in the bases bilaterally unchanged from the prior exam. No bony abnormality is seen. IMPRESSION: Mild bibasilar scarring.  No acute abnormality is noted. Electronically Signed   By: Inez Catalina M.D.   On: 11/20/2018 21:58        Scheduled Meds: . enoxaparin (LOVENOX) injection  30 mg Subcutaneous Q24H  . insulin aspart  0-5 Units Subcutaneous QHS  . insulin aspart  0-9 Units Subcutaneous TID WC  . ipratropium-albuterol  3 mL Nebulization TID  . mouth rinse  15 mL Mouth Rinse BID  . predniSONE  40 mg Oral Q breakfast   Continuous Infusions: . cefTRIAXone (ROCEPHIN)  IV 2 g (11/22/18 1027)     LOS: 2 days    Time spent: 21min    Domenic Polite, MD [facility]  11/22/2018, 11:46 AM

## 2018-11-22 NOTE — Progress Notes (Signed)
Abx adjustments:  Pt was started on vanc/zosyn/azith for his sepsis/COPD coverage before it was narrow to azithromycin. BCID came back with strep species that is not on the panel. It would be strep viridans/group C/G or something else. Since azith is no longer that reliable for strep, d/w with Dr. Broadus John to empirically change to ceftriaxone for now.  Dc azith Ceftriaxone 2g IV q24  Onnie Boer, PharmD, [place], AAHIVP, CPP Infectious Disease Pharmacist 11/22/2018 9:57 AM

## 2018-11-23 DIAGNOSIS — I420 Dilated cardiomyopathy: Secondary | ICD-10-CM

## 2018-11-23 DIAGNOSIS — N179 Acute kidney failure, unspecified: Secondary | ICD-10-CM

## 2018-11-23 LAB — CBC
HEMATOCRIT: 43 % (ref 39.0–52.0)
Hemoglobin: 13.6 g/dL (ref 13.0–17.0)
MCH: 29.2 pg (ref 26.0–34.0)
MCHC: 31.6 g/dL (ref 30.0–36.0)
MCV: 92.5 fL (ref 80.0–100.0)
Platelets: 153 10*3/uL (ref 150–400)
RBC: 4.65 MIL/uL (ref 4.22–5.81)
RDW: 16.8 % — ABNORMAL HIGH (ref 11.5–15.5)
WBC: 5.1 10*3/uL (ref 4.0–10.5)
nRBC: 1.4 % — ABNORMAL HIGH (ref 0.0–0.2)

## 2018-11-23 LAB — COMPREHENSIVE METABOLIC PANEL
ALBUMIN: 2.5 g/dL — AB (ref 3.5–5.0)
ALT: 669 U/L — ABNORMAL HIGH (ref 0–44)
ANION GAP: 6 (ref 5–15)
AST: 240 U/L — ABNORMAL HIGH (ref 15–41)
Alkaline Phosphatase: 81 U/L (ref 38–126)
BILIRUBIN TOTAL: 0.5 mg/dL (ref 0.3–1.2)
BUN: 31 mg/dL — ABNORMAL HIGH (ref 8–23)
CO2: 26 mmol/L (ref 22–32)
Calcium: 8.1 mg/dL — ABNORMAL LOW (ref 8.9–10.3)
Chloride: 108 mmol/L (ref 98–111)
Creatinine, Ser: 1.16 mg/dL (ref 0.61–1.24)
GFR calc non Af Amer: >60 mL/min (ref >60)
Glucose, Bld: 119 mg/dL — ABNORMAL HIGH (ref 70–99)
Potassium: 4.6 mmol/L (ref 3.5–5.1)
Sodium: 140 mmol/L (ref 135–145)
Total Protein: 5.3 g/dL — ABNORMAL LOW (ref 6.5–8.1)

## 2018-11-23 LAB — GLUCOSE, CAPILLARY
GLUCOSE-CAPILLARY: 178 mg/dL — AB (ref 70–99)
Glucose-Capillary: 73 mg/dL (ref 70–99)
Glucose-Capillary: 134 mg/dL — ABNORMAL HIGH (ref 70–99)
Glucose-Capillary: 234 mg/dL — ABNORMAL HIGH (ref 70–99)

## 2018-11-23 LAB — CULTURE, BLOOD (ROUTINE X 2)

## 2018-11-23 MED ORDER — APIXABAN 5 MG PO TABS
5.0000 mg | ORAL_TABLET | Freq: Two times a day (BID) | ORAL | Status: DC
  Administered 2018-11-23 – 2018-11-30 (×15): 5 mg via ORAL
  Filled 2018-11-23 (×15): qty 1

## 2018-11-23 MED ORDER — PREDNISONE 20 MG PO TABS
30.0000 mg | ORAL_TABLET | Freq: Every day | ORAL | Status: AC
  Administered 2018-11-24 – 2018-11-25 (×2): 30 mg via ORAL
  Filled 2018-11-23 (×2): qty 1

## 2018-11-23 MED ORDER — CARVEDILOL 6.25 MG PO TABS
6.2500 mg | ORAL_TABLET | Freq: Two times a day (BID) | ORAL | Status: DC
  Administered 2018-11-23 – 2018-11-24 (×2): 6.25 mg via ORAL
  Filled 2018-11-23 (×2): qty 1

## 2018-11-23 MED ORDER — FUROSEMIDE 10 MG/ML IJ SOLN
20.0000 mg | Freq: Two times a day (BID) | INTRAMUSCULAR | Status: AC
  Administered 2018-11-23 (×2): 20 mg via INTRAVENOUS
  Filled 2018-11-23 (×2): qty 2

## 2018-11-23 MED ORDER — LISINOPRIL 5 MG PO TABS
5.0000 mg | ORAL_TABLET | Freq: Every day | ORAL | Status: DC
  Administered 2018-11-23 – 2018-11-24 (×2): 5 mg via ORAL
  Filled 2018-11-23 (×2): qty 1

## 2018-11-23 NOTE — Progress Notes (Signed)
Progress Note  Patient Name: Kyle [name] Date of Encounter: 11/23/2018  Primary Cardiologist: Dr Martinique  Subjective   No CP or dyspnea  Inpatient Medications    Scheduled Meds: . enoxaparin (LOVENOX) injection  30 mg Subcutaneous Q24H  . insulin aspart  0-5 Units Subcutaneous QHS  . insulin aspart  0-9 Units Subcutaneous TID WC  . ipratropium-albuterol  3 mL Nebulization TID  . mouth rinse  15 mL Mouth Rinse BID  . predniSONE  40 mg Oral Q breakfast   Continuous Infusions: . cefTRIAXone (ROCEPHIN)  IV 2 g (11/22/18 1027)   PRN Meds: albuterol, ondansetron (ZOFRAN) IV   Vital Signs    Vitals:   11/22/18 2300 11/23/18 0310 11/23/18 0631 11/23/18 0731  BP: 135/76 126/87  (!) 152/92  Pulse: (!) 58 (!) 56 (!) 52 (!) 51  Resp: 20 16 13 18   Temp: 98.2 F (36.8 C) 98.5 F (36.9 C)  97.6 F (36.4 C)  TempSrc: Oral Oral  Oral  SpO2: 90% 95% 98% 93%  Weight:      Height:        Intake/Output Summary (Last 24 hours) at 11/23/2018 0831 Last data filed at 11/23/2018 0600 Gross per 24 hour  Intake 1045 ml  Output 475 ml  Net 570 ml   Last 3 Weights 11/22/2018 11/21/2018 11/21/2018  Weight (lbs) 174 lb 6.1 oz 174 lb 6.1 oz 149 lb 14.6 oz  Weight (kg) 79.1 kg 79.1 kg 68 kg      Telemetry    NSR with 15 beats NSVT- Personally Reviewed   Physical Exam   GEN: NAD; WD WN Neck: supple, no JVD Cardiac: RRR Respiratory: CTA; no wheeze GI: Soft, NT/ND, no masses MS: No edema Neuro:  No focal findings   Labs    Chemistry Recent Labs  Lab 11/20/18 1927  11/21/18 0220 11/22/18 0210 11/23/18 0328  NA 141  --  143 141 140  K 5.0  --  4.5 4.8 4.6  CL 105  --  107 105 108  CO2 17*  --  22 24 26   GLUCOSE 165*  --  156* 195* 119*  BUN 60*  --  62* 62* 31*  CREATININE 2.73*   < > 2.37* 1.91* 1.16  CALCIUM 8.3*  --  7.5* 8.0* 8.1*  PROT 5.6*  --   --  5.7* 5.3*  ALBUMIN 2.7*  --   --  2.7* 2.5*  AST 882*  --   --  740* 240*  ALT 883*  --   --  1,006* 669*    ALKPHOS 94  --   --  103 81  BILITOT 0.7  --   --  0.9 0.5  GFRNONAA 23*   < > 28* 36* >60  GFRAA 27*   < > 32* 41* >60  ANIONGAP 19*  --  14 12 6    < > = values in this interval not displayed.     Hematology Recent Labs  Lab 11/21/18 0220 11/22/18 0210 11/23/18 0328  WBC 7.5 4.3 5.1  RBC 4.81 4.94 4.65  HGB 14.3 14.6 13.6  HCT 45.0 46.4 43.0  MCV 93.6 93.9 92.5  MCH 29.7 29.6 29.2  MCHC 31.8 31.5 31.6  RDW 17.7* 17.6* 16.8*  PLT 183 190 153    Cardiac Enzymes Recent Labs  Lab 11/20/18 1927 11/21/18 0706 11/21/18 1144 11/21/18 1833  TROPONINI 3.14* 2.98* 2.82* 2.40*    BNP Recent Labs  Lab 11/20/18 2341  BNP  709.2*     Radiology    Dg Chest 2 View  Result Date: 11/22/2018 CLINICAL DATA:  Hypoxia. EXAM: CHEST - 2 VIEW COMPARISON:  Radiograph of November 20, 2018. FINDINGS: Stable cardiomegaly. No pneumothorax is noted. Left lung is clear. Minimal right basilar subsegmental atelectasis is noted with minimal right pleural effusion. Bony thorax is unremarkable. IMPRESSION: Minimal right basilar subsegmental atelectasis with minimal right pleural effusion. Electronically Signed   By: Marijo Conception, M.D.   On: 11/22/2018 10:12    Patient Profile     68 y.o. male with past medical history of nonischemic cardiomyopathy improved on most recent echocardiogram, peripheral vascular disease admitted with hypotension, possible pneumonia, acute renal insufficiency and elevated liver functions.  Also with atrial fibrillation.  We were asked to see for question cardiac component of hypotension.  Assessment & Plan    1 hypotension-possibly secondary to dehydration.  Probable recent viral syndrome with decreased p.o. intake, nausea and diarrhea.  Symptoms have improved and blood pressure has improved. Echocardiogram shows ejection fraction 40 to 45%, mild aortic stenosis and moderate aortic insufficiency.  Unchanged compared to previous.  I will resume carvedilol at 6.25 mg  twice daily and quinapril at 5 mg daily.  Can increase to preadmission doses at discharge if blood pressure allows.  We will continue to hold Lasix for now.  Note 1 blood culture positive for strep viridans; possible contaminant. Per primary service.    2 elevated troponin-troponin mildly elevated but no clear trend and no chest pain.  Elevation occurred in the setting of hypotension and acute renal failure.  Previous catheterization showed no coronary disease.  We will consider outpatient nuclear study following discharge.  3 atrial fibrillation-patient remains in sinus rhythm today.  Atrial fibrillation possibly secondary to stress of presenting illness.  Will resume carvedilol as outlined above.  I will treat with apixaban 5 mg twice daily.  Will need follow-up in the office.  4 acute renal failure-improving.  5 elevated liver functions-possibly related to shock liver.  Improving.  6 history of bicuspid aortic valve-mild aortic stenosis and moderate aortic insufficiency on echocardiogram.  Will need follow-up as an outpatient.  7 nonsustained ventricular tachycardia-we will resume carvedilol 6.25 mg twice daily as outlined above.  Patient could likely be discharged from a cardiac standpoint on present medications and follow-up in the office in 1 week.  For questions or updates, please contact [place] Please consult [email or web address] for contact info under        Signed, Kirk Ruths, MD  11/23/2018, 8:31 AM

## 2018-11-23 NOTE — Discharge Instructions (Signed)

## 2018-11-23 NOTE — Care Management Important Message (Signed)
Important Message  Patient Details  Name: Kyle [name] MRN: [id number] Date of Birth: 29-Sep-1951   Medicare Important Message Given:  Yes    [name] 11/23/2018, 4:08 PM

## 2018-11-23 NOTE — Progress Notes (Signed)
PROGRESS NOTE    Kyle [name]  ZJI:[id number] DOB: 1951-09-04 DOA: 11/20/2018 PCP: Alonna Buckler, MD  Brief Narrative: 68 year old male with history of nonischemic cardiomyopathy, moderate pulmonary hypertension, COPD, PAD status post femoropopliteal bypass and aortobifemoral bypass in 2016 was admitted to the ICU on 1/26 with hypotension, malaise, nausea vomiting cough congestion night sweats chills and syncope the day of admission. -Treated with antibiotics, fluid resuscitation and pressors -Also being followed by cardiology for atrial fibrillation and cardiomyopathy -Transferred from PCCM to [facility] service 1/28  Assessment & Plan:   Shock -Resolved, per PCCM probably hypovolemic shock due to nausea vomiting decreased oral intake -Treated with fluid resuscitation and pressors in ICU -Improved -Blood cultures on admission 1/26, 1/2 bottles with strep viridans -Did not have fevers or leukocytosis on admission, did have some cough and congestion, discussed with ID Dr.Comer could have been transient bacteremia from oral source, repeat cultures are negative, afebrile nontoxic, recommended stopping Abx  -2D echocardiogram unremarkable as well -will discontinue ceftriaxone and monitor  Hypoxia/COPD exacerbation -improving -Antibiotics as above, add duo nebs, steroid taper  Acute kidney injury -Likely due to hypoperfusion/hypotension, creatinine improving was 2.7 on admission, down to 1.9 now -off IVF now, creatinine has normalized  Diabetes mellitus  Transaminitis -Probably shock liver, LFTs improving, he was hypotensive on admission  Dilated cardiomyopathy/chronic systolic CHF/pulmonary hypertension -ECHO with EF of 40 to 45%, mild left ear and moderate AR, unchanged from prior -No coronary disease disease based on prior cath -appears slightly vol overloaded, lasix x1 today  Paroxysmal atrial fibrillation -Back in sinus rhythm now, carvedilol on hold with  hypotension, -restarted coreg, and newly started on Apixaban  History of bicuspid aortic valve, mild AS, moderate AR -Outpatient follow-up  DVT prophylaxis: Lovenox Code Status: Full code Family Communication: No family at bedside Disposition Plan: Home pending clinical improvement in respiratory status, bacteremia etc.  Consultants:   Cardiology  PCCM   Procedures:   Antimicrobials:    Subjective: -Improving, overall getting better, mild dyspnea with exertion some cough  Objective: Vitals:   11/22/18 2300 11/23/18 0310 11/23/18 0631 11/23/18 0731  BP: 135/76 126/87  (!) 152/92  Pulse: (!) 58 (!) 56 (!) 52 (!) 51  Resp: 20 16 13 18   Temp: 98.2 F (36.8 C) 98.5 F (36.9 C)  97.6 F (36.4 C)  TempSrc: Oral Oral  Oral  SpO2: 90% 95% 98% 93%  Weight:      Height:        Intake/Output Summary (Last 24 hours) at 11/23/2018 1115 Last data filed at 11/23/2018 1751 Gross per 24 hour  Intake 565 ml  Output 300 ml  Net 265 ml   Filed Weights   11/21/18 0136 11/21/18 2122 11/22/18 0500  Weight: 68 kg 79.1 kg 79.1 kg    Examination:  Gen: Awake, Alert, Oriented X 3, no distress today HEENT: PERRLA, Neck supple, no JVD Lungs: Improving air movement, no expiratory wheezes, decreased breath sounds at both bases CVS: RRR,No Gallops,Rubs or new Murmurs Abd: soft, Non tender, non distended, BS present Extremities: No edema Skin: no new rashes Psychiatry: Judgement and insight appear normal. Mood & affect appropriate.     Data Reviewed:   CBC: Recent Labs  Lab 11/20/18 1927 11/20/18 2341 11/21/18 0220 11/22/18 0210 11/23/18 0328  WBC 7.0 7.7 7.5 4.3 5.1  NEUTROABS 4.1  --   --   --   --   HGB 14.7 14.7 14.3 14.6 13.6  HCT 48.0 47.1 45.0 46.4  43.0  MCV 96.4 95.0 93.6 93.9 92.5  PLT 136* 184 183 190 258   Basic Metabolic Panel: Recent Labs  Lab 11/20/18 1927 11/20/18 2341 11/21/18 0220 11/22/18 0210 11/23/18 0328  NA 141  --  143 141 140  K 5.0  --   4.5 4.8 4.6  CL 105  --  107 105 108  CO2 17*  --  22 24 26   GLUCOSE 165*  --  156* 195* 119*  BUN 60*  --  62* 62* 31*  CREATININE 2.73* 2.54* 2.37* 1.91* 1.16  CALCIUM 8.3*  --  7.5* 8.0* 8.1*  MG  --   --  2.0  --   --   PHOS  --   --  5.9*  --   --    GFR: Estimated Creatinine Clearance: 66.7 mL/min (by C-G formula based on SCr of 1.16 mg/dL). Liver Function Tests: Recent Labs  Lab 11/20/18 1927 11/22/18 0210 11/23/18 0328  AST 882* 740* 240*  ALT 883* 1,006* 669*  ALKPHOS 94 103 81  BILITOT 0.7 0.9 0.5  PROT 5.6* 5.7* 5.3*  ALBUMIN 2.7* 2.7* 2.5*   Recent Labs  Lab 11/20/18 1927  LIPASE 28   No results for input(s): AMMONIA in the last 168 hours. Coagulation Profile: No results for input(s): INR, PROTIME in the last 168 hours. Cardiac Enzymes: Recent Labs  Lab 11/20/18 1927 11/21/18 0706 11/21/18 1144 11/21/18 1833  TROPONINI 3.14* 2.98* 2.82* 2.40*   BNP (last 3 results) No results for input(s): PROBNP in the last 8760 hours. HbA1C: Recent Labs    11/21/18 1144  HGBA1C 6.9*   CBG: Recent Labs  Lab 11/22/18 0740 11/22/18 1157 11/22/18 1518 11/22/18 2112 11/23/18 0729  GLUCAP 127* 176* 242* 161* 73   Lipid Profile: No results for input(s): CHOL, HDL, LDLCALC, TRIG, CHOLHDL, LDLDIRECT in the last 72 hours. Thyroid Function Tests: No results for input(s): TSH, T4TOTAL, FREET4, T3FREE, THYROIDAB in the last 72 hours. Anemia Panel: No results for input(s): VITAMINB12, FOLATE, FERRITIN, TIBC, IRON, RETICCTPCT in the last 72 hours. Urine analysis:    Component Value Date/Time   COLORURINE AMBER (A) 11/21/2018 0211   APPEARANCEUR CLOUDY (A) 11/21/2018 0211   LABSPEC 1.020 11/21/2018 0211   PHURINE 5.0 11/21/2018 0211   GLUCOSEU NEGATIVE 11/21/2018 0211   HGBUR NEGATIVE 11/21/2018 0211   BILIRUBINUR NEGATIVE 11/21/2018 0211   KETONESUR NEGATIVE 11/21/2018 0211   PROTEINUR 30 (A) 11/21/2018 0211   UROBILINOGEN 0.2 12/19/2014 0821   NITRITE  NEGATIVE 11/21/2018 0211   LEUKOCYTESUR NEGATIVE 11/21/2018 0211   Sepsis Labs: @LABRCNTIP (procalcitonin:4,lacticidven:4)  ) Recent Results (from the past 240 hour(s))  Culture, blood (Routine X 2) w Reflex to ID Panel     Status: Abnormal   Collection Time: 11/20/18  7:35 PM  Result Value Ref Range Status   Specimen Description BLOOD RIGHT ANTECUBITAL  Final   Special Requests   Final    BOTTLES DRAWN AEROBIC AND ANAEROBIC Blood Culture results may not be optimal due to an inadequate volume of blood received in culture bottles   Culture  Setup Time   Final    GRAM POSITIVE COCCI IN CHAINS AEROBIC BOTTLE ONLY CRITICAL RESULT CALLED TO, READ BACK BY AND VERIFIED WITH: Asencion Islam PharmD 17:20 11/21/18 ([email or web address])    Culture (A)  Final    VIRIDANS STREPTOCOCCUS THE SIGNIFICANCE OF ISOLATING THIS ORGANISM FROM A SINGLE SET OF BLOOD CULTURES WHEN MULTIPLE SETS ARE DRAWN IS UNCERTAIN. PLEASE NOTIFY THE MICROBIOLOGY DEPARTMENT WITHIN  ONE WEEK IF SPECIATION AND SENSITIVITIES ARE REQUIRED. Performed at [facility], [place] [address], [place], [place] [address]    Report Status 11/23/2018 FINAL  Final  Blood Culture ID Panel (Reflexed)     Status: Abnormal   Collection Time: 11/20/18  7:35 PM  Result Value Ref Range Status   Enterococcus species NOT DETECTED NOT DETECTED Final   Listeria monocytogenes NOT DETECTED NOT DETECTED Final   Staphylococcus species NOT DETECTED NOT DETECTED Final   Staphylococcus aureus (BCID) NOT DETECTED NOT DETECTED Final   Streptococcus species DETECTED (A) NOT DETECTED Final    Comment: Not Enterococcus species, Streptococcus agalactiae, Streptococcus pyogenes, or Streptococcus pneumoniae. CRITICAL RESULT CALLED TO, READ BACK BY AND VERIFIED WITH: Asencion Islam PharmD 17:20 11/21/18 ([email or web address])    Streptococcus agalactiae NOT DETECTED NOT DETECTED Final   Streptococcus pneumoniae NOT DETECTED NOT DETECTED Final   Streptococcus pyogenes NOT DETECTED NOT  DETECTED Final   Acinetobacter baumannii NOT DETECTED NOT DETECTED Final   Enterobacteriaceae species NOT DETECTED NOT DETECTED Final   Enterobacter cloacae complex NOT DETECTED NOT DETECTED Final   Escherichia coli NOT DETECTED NOT DETECTED Final   Klebsiella oxytoca NOT DETECTED NOT DETECTED Final   Klebsiella pneumoniae NOT DETECTED NOT DETECTED Final   Proteus species NOT DETECTED NOT DETECTED Final   Serratia marcescens NOT DETECTED NOT DETECTED Final   Haemophilus influenzae NOT DETECTED NOT DETECTED Final   Neisseria meningitidis NOT DETECTED NOT DETECTED Final   Pseudomonas aeruginosa NOT DETECTED NOT DETECTED Final   Candida albicans NOT DETECTED NOT DETECTED Final   Candida glabrata NOT DETECTED NOT DETECTED Final   Candida krusei NOT DETECTED NOT DETECTED Final   Candida parapsilosis NOT DETECTED NOT DETECTED Final   Candida tropicalis NOT DETECTED NOT DETECTED Final    Comment: Performed at [facility], [place] [address], [place], [place] [address]  Culture, blood (Routine X 2) w Reflex to ID Panel     Status: None (Preliminary result)   Collection Time: 11/20/18  7:50 PM  Result Value Ref Range Status   Specimen Description BLOOD LEFT HAND  Final   Special Requests   Final    BOTTLES DRAWN AEROBIC AND ANAEROBIC Blood Culture results may not be optimal due to an inadequate volume of blood received in culture bottles   Culture   Final    NO GROWTH 3 DAYS Performed at [facility], [place] [address] [place], [place] [address]    Report Status PENDING  Incomplete  MRSA PCR Screening     Status: None   Collection Time: 11/21/18  1:01 AM  Result Value Ref Range Status   MRSA by PCR NEGATIVE NEGATIVE Final    Comment:        The GeneXpert MRSA Assay (FDA approved for NASAL specimens only), is one component of a comprehensive MRSA colonization surveillance program. It is not intended to diagnose MRSA infection nor to guide or monitor treatment for MRSA  infections. Performed at [facility], [place] [address], [place], [place] [address]   Culture, Urine     Status: None   Collection Time: 11/21/18  2:11 AM  Result Value Ref Range Status   Specimen Description URINE, RANDOM  Final   Special Requests NONE  Final   Culture   Final    NO GROWTH Performed at [facility], [place] [address], [place], [place] [address]    Report Status 11/22/2018 FINAL  Final  Culture,  blood (routine x 2)     Status: None (Preliminary result)   Collection Time: 11/21/18  9:41 PM  Result Value Ref Range Status   Specimen Description BLOOD BLOOD RIGHT FOREARM  Final   Special Requests   Final    BOTTLES DRAWN AEROBIC ONLY Blood Culture adequate volume   Culture   Final    NO GROWTH 2 DAYS Performed at [facility], [address], [place], [place] [address]    Report Status PENDING  Incomplete  Culture, blood (routine x 2)     Status: None (Preliminary result)   Collection Time: 11/21/18  9:41 PM  Result Value Ref Range Status   Specimen Description BLOOD BLOOD LEFT FOREARM  Final   Special Requests   Final    BOTTLES DRAWN AEROBIC ONLY Blood Culture adequate volume   Culture   Final    NO GROWTH 2 DAYS Performed at [facility], [place] [address] [place], [place] [address]    Report Status PENDING  Incomplete         Radiology Studies: Dg Chest 2 View  Result Date: 11/22/2018 CLINICAL DATA:  Hypoxia. EXAM: CHEST - 2 VIEW COMPARISON:  Radiograph of November 20, 2018. FINDINGS: Stable cardiomegaly. No pneumothorax is noted. Left lung is clear. Minimal right basilar subsegmental atelectasis is noted with minimal right pleural effusion. Bony thorax is unremarkable. IMPRESSION: Minimal right basilar subsegmental atelectasis with minimal right pleural effusion. Electronically Signed   By: Marijo Conception, M.D.   On: 11/22/2018 10:12        Scheduled Meds: . apixaban  5 mg Oral BID  . carvedilol  6.25 mg Oral BID WC  . insulin  aspart  0-5 Units Subcutaneous QHS  . insulin aspart  0-9 Units Subcutaneous TID WC  . ipratropium-albuterol  3 mL Nebulization TID  . lisinopril  5 mg Oral Daily  . mouth rinse  15 mL Mouth Rinse BID  . predniSONE  40 mg Oral Q breakfast   Continuous Infusions: . cefTRIAXone (ROCEPHIN)  IV 2 g (11/23/18 0920)     LOS: 3 days    Time spent: 65min    Domenic Polite, MD [facility]  11/23/2018, 11:15 AM

## 2018-11-24 LAB — CBC
HCT: 43.5 % (ref 39.0–52.0)
Hemoglobin: 14.5 g/dL (ref 13.0–17.0)
MCH: 30.5 pg (ref 26.0–34.0)
MCHC: 33.3 g/dL (ref 30.0–36.0)
MCV: 91.4 fL (ref 80.0–100.0)
Platelets: 142 10*3/uL — ABNORMAL LOW (ref 150–400)
RBC: 4.76 MIL/uL (ref 4.22–5.81)
RDW: 16.5 % — ABNORMAL HIGH (ref 11.5–15.5)
WBC: 5.6 10*3/uL (ref 4.0–10.5)
nRBC: 1.1 % — ABNORMAL HIGH (ref 0.0–0.2)

## 2018-11-24 LAB — GLUCOSE, CAPILLARY
Glucose-Capillary: 75 mg/dL (ref 70–99)
Glucose-Capillary: 273 mg/dL — ABNORMAL HIGH (ref 70–99)
Glucose-Capillary: 114 mg/dL — ABNORMAL HIGH (ref 70–99)

## 2018-11-24 LAB — COMPREHENSIVE METABOLIC PANEL
ALK PHOS: 77 U/L (ref 38–126)
ALT: 452 U/L — ABNORMAL HIGH (ref 0–44)
AST: 112 U/L — ABNORMAL HIGH (ref 15–41)
Albumin: 2.5 g/dL — ABNORMAL LOW (ref 3.5–5.0)
Anion gap: 8 (ref 5–15)
BUN: 21 mg/dL (ref 8–23)
CALCIUM: 8.1 mg/dL — AB (ref 8.9–10.3)
CO2: 26 mmol/L (ref 22–32)
Chloride: 106 mmol/L (ref 98–111)
Creatinine, Ser: 0.98 mg/dL (ref 0.61–1.24)
GFR calc Af Amer: >60 mL/min (ref >60)
GFR calc non Af Amer: >60 mL/min (ref >60)
Glucose, Bld: 104 mg/dL — ABNORMAL HIGH (ref 70–99)
Potassium: 4.3 mmol/L (ref 3.5–5.1)
Sodium: 140 mmol/L (ref 135–145)
TOTAL PROTEIN: 5.5 g/dL — AB (ref 6.5–8.1)
Total Bilirubin: 0.8 mg/dL (ref 0.3–1.2)

## 2018-11-24 MED ORDER — LISINOPRIL 20 MG PO TABS
20.0000 mg | ORAL_TABLET | Freq: Every day | ORAL | Status: DC
  Administered 2018-11-25 – 2018-11-30 (×6): 20 mg via ORAL
  Filled 2018-11-24 (×6): qty 1

## 2018-11-24 MED ORDER — FUROSEMIDE 40 MG PO TABS
40.0000 mg | ORAL_TABLET | Freq: Every day | ORAL | Status: DC
  Administered 2018-11-24: 40 mg via ORAL
  Filled 2018-11-24: qty 1

## 2018-11-24 MED ORDER — AMLODIPINE BESYLATE 5 MG PO TABS
5.0000 mg | ORAL_TABLET | Freq: Every day | ORAL | Status: DC
  Administered 2018-11-24 – 2018-11-30 (×7): 5 mg via ORAL
  Filled 2018-11-24 (×7): qty 1

## 2018-11-24 MED ORDER — CARVEDILOL 3.125 MG PO TABS: 3.1250 mg | ORAL_TABLET | Freq: Two times a day (BID) | ORAL | Status: DC

## 2018-11-24 MED ORDER — CARVEDILOL 12.5 MG PO TABS
12.5000 mg | ORAL_TABLET | Freq: Two times a day (BID) | ORAL | Status: DC
  Administered 2018-11-24 – 2018-11-30 (×11): 12.5 mg via ORAL
  Filled 2018-11-24 (×11): qty 1

## 2018-11-24 MED ORDER — LISINOPRIL 10 MG PO TABS: 10.0000 mg | ORAL_TABLET | Freq: Every day | ORAL | Status: DC

## 2018-11-24 MED ORDER — LISINOPRIL 10 MG PO TABS
10.0000 mg | ORAL_TABLET | Freq: Once | ORAL | Status: AC
  Administered 2018-11-24: 10 mg via ORAL
  Filled 2018-11-24: qty 1

## 2018-11-24 NOTE — Progress Notes (Signed)
Progress Note  Patient Name: Kyle [name] Date of Encounter: 11/24/2018  Primary Cardiologist: Dr Martinique  Subjective   Pt denies CP or dyspnea  Inpatient Medications    Scheduled Meds: . amLODipine  5 mg Oral Daily  . apixaban  5 mg Oral BID  . carvedilol  3.125 mg Oral BID WC  . insulin aspart  0-5 Units Subcutaneous QHS  . insulin aspart  0-9 Units Subcutaneous TID WC  . ipratropium-albuterol  3 mL Nebulization TID  . lisinopril  5 mg Oral Daily  . mouth rinse  15 mL Mouth Rinse BID  . predniSONE  30 mg Oral Q breakfast   Continuous Infusions:  PRN Meds: albuterol, ondansetron (ZOFRAN) IV   Vital Signs    Vitals:   11/23/18 2342 11/24/18 0411 11/24/18 0736 11/24/18 0744  BP: (!) 145/81 (!) 168/71 (!) 153/81   Pulse:  (!) 57 (!) 48   Resp: 18 14 14    Temp: 98.5 F (36.9 C) (!) 97.5 F (36.4 C) 98.1 F (36.7 C)   TempSrc: Oral Oral Oral   SpO2: 91% 93% 97% 92%  Weight:  72.2 kg    Height:        Intake/Output Summary (Last 24 hours) at 11/24/2018 1200 Last data filed at 11/24/2018 0336 Gross per 24 hour  Intake -  Output 751 ml  Net -751 ml   Last 3 Weights 11/24/2018 11/22/2018 11/21/2018  Weight (lbs) 159 lb 2.8 oz 174 lb 6.1 oz 174 lb 6.1 oz  Weight (kg) 72.2 kg 79.1 kg 79.1 kg      Telemetry    NSR- Personally Reviewed   Physical Exam   GEN: No acute distress; WD WN Neck: supple Cardiac: RRR, no murmur Respiratory: CTA; no rhonchi GI: Soft, not tender, not distended MS: No edema Neuro:  Grossly intact  Labs    Chemistry Recent Labs  Lab 11/22/18 0210 11/23/18 0328 11/24/18 0314  NA 141 140 140  K 4.8 4.6 4.3  CL 105 108 106  CO2 24 26 26   GLUCOSE 195* 119* 104*  BUN 62* 31* 21  CREATININE 1.91* 1.16 0.98  CALCIUM 8.0* 8.1* 8.1*  PROT 5.7* 5.3* 5.5*  ALBUMIN 2.7* 2.5* 2.5*  AST 740* 240* 112*  ALT 1,006* 669* 452*  ALKPHOS 103 81 77  BILITOT 0.9 0.5 0.8  GFRNONAA 36* >60 >60  GFRAA 41* >60 >60  ANIONGAP 12 6 8       Hematology Recent Labs  Lab 11/22/18 0210 11/23/18 0328 11/24/18 0314  WBC 4.3 5.1 5.6  RBC 4.94 4.65 4.76  HGB 14.6 13.6 14.5  HCT 46.4 43.0 43.5  MCV 93.9 92.5 91.4  MCH 29.6 29.2 30.5  MCHC 31.5 31.6 33.3  RDW 17.6* 16.8* 16.5*  PLT 190 153 142*    Cardiac Enzymes Recent Labs  Lab 11/20/18 1927 11/21/18 0706 11/21/18 1144 11/21/18 1833  TROPONINI 3.14* 2.98* 2.82* 2.40*    BNP Recent Labs  Lab 11/20/18 2341  BNP 709.2*     Radiology    No results found.  Patient Profile     68 y.o. male with past medical history of nonischemic cardiomyopathy improved on most recent echocardiogram, peripheral vascular disease admitted with hypotension, possible pneumonia, acute renal insufficiency and elevated liver functions.  Also with atrial fibrillation.  We were asked to see for question cardiac component of hypotension.  Assessment & Plan    1 hypotension-felt secondary to dehydration and likely viral syndrome at time of admission.  Much improved. Echocardiogram shows ejection fraction 40 to 45%, mild aortic stenosis and moderate aortic insufficiency.  Unchanged compared to previous.  Blood pressure is increasing.  Would resume all preadmission medications and follow. Note 1 blood culture positive for strep viridans; possible contaminant. Per primary service.    2 elevated troponin-as outlined previously troponin was elevated but there was no clear trend and no chest pain.  Occurred in the setting of hypotension and acute renal failure.  Can consider stress nuclear study for risk stratification following discharge.    3 atrial fibrillation-patient remains in sinus rhythm today.  Atrial fibrillation possibly secondary to stress of presenting illness.  Resume previous dose of carvedilol.  Continue apixaban.  4 acute renal failure-resolved.  5 elevated liver functions-continues to improve.  Possibly secondary to hypotension at time of admission.  6 history of bicuspid  aortic valve-mild aortic stenosis and moderate aortic insufficiency on echocardiogram.  Will need follow-up as an outpatient.  7 nonsustained ventricular tachycardia-no further arrhythmias.  Continue beta-blocker.  [facility] will sign off.   Medication Recommendations: Resume preadmission medications at discharge. Other recommendations (labs, testing, etc): No further inpatient cardiac testing. Follow up as an outpatient: Follow-up Dr. Martinique in 4 to 6 weeks after discharge.  Can consider nuclear study for risk stratification at that time.  For questions or updates, please contact [place] Please consult [email or web address] for contact info under        Signed, Kirk Ruths, MD  11/24/2018, 12:00 PM

## 2018-11-24 NOTE — Progress Notes (Signed)
PROGRESS NOTE    Kyle [name]  CHE:[id number] DOB: 1951-01-21 DOA: 11/20/2018 PCP: Alonna Buckler, MD  Brief Narrative: 68 year old male with history of nonischemic cardiomyopathy, moderate pulmonary hypertension, COPD, PAD status post femoropopliteal bypass and aortobifemoral bypass in 2016 was admitted to the ICU on 1/26 with hypotension, malaise, nausea vomiting cough congestion night sweats chills and syncope the day of admission. -Treated with antibiotics, fluid resuscitation and pressors -Also being followed by cardiology for atrial fibrillation and cardiomyopathy -Transferred from PCCM to [facility] service 1/28  Assessment & Plan:   Shock -Resolved, per PCCM probably hypovolemic shock due to nausea vomiting decreased oral intake -Treated with fluid resuscitation and pressors in ICU -Improved -Blood cultures on admission 1/26, 1/2 bottles with strep viridans -Did not have fevers or leukocytosis on admission, did have some cough and congestion, discussed with ID Dr.Comer could have been transient bacteremia from oral source, repeat cultures are negative, afebrile nontoxic, recommended stopping Abx  -2D echocardiogram unremarkable as well -will discontinue ceftriaxone and monitor  Hypoxia/COPD exacerbation -improving -Antibiotics as above, add duo nebs, steroid taper  Acute kidney injury -Likely due to hypoperfusion/hypotension, creatinine improving was 2.7 on admission, down to 1.9 now -off IVF now, creatinine has normalized  Diabetes mellitus  Transaminitis -Probably shock liver, LFTs improving, he was hypotensive on admission  Dilated cardiomyopathy/chronic systolic CHF/pulmonary hypertension -ECHO with EF of 40 to 45%, mild left ear and moderate AR, unchanged from prior -No coronary disease disease based on prior cath -appears slightly vol overloaded, lasix x1 today  Paroxysmal atrial fibrillation -Back in sinus rhythm now, carvedilol on hold with  hypotension, -restarted coreg, and newly started on Apixaban  History of bicuspid aortic valve, mild AS, moderate AR -Outpatient follow-up  hypertension with urgency. Blood pressure remained stable elevated. Resume Norvasc, Coreg dose increased by cardiology. We will increase lisinopril dose.  Monitor.  DVT prophylaxis: Lovenox Code Status: Full code Family Communication: No family at bedside Disposition Plan: Home pending clinical improvement in respiratory status, bacteremia etc.  Consultants:   Cardiology  PCCM   Procedures:   Antimicrobials:    Subjective: No acute complaint no nausea no vomiting no fever no chills.  Objective: Vitals:   11/24/18 1100 11/24/18 1438 11/24/18 1700 11/24/18 1800  BP: (!) 150/85  (!) 150/82 (!) 174/90  Pulse: (!) 50  (!) 55 79  Resp:   16 19  Temp: 98 F (36.7 C)  98 F (36.7 C)   TempSrc: Oral  Oral   SpO2: 95% 91% 95% 90%  Weight:      Height:        Intake/Output Summary (Last 24 hours) at 11/24/2018 1853 Last data filed at 11/24/2018 1800 Gross per 24 hour  Intake 600 ml  Output 1551 ml  Net -951 ml   Filed Weights   11/21/18 2122 11/22/18 0500 11/24/18 0411  Weight: 79.1 kg 79.1 kg 72.2 kg    Examination:  Gen: Awake, Alert, Oriented X 3, no distress today HEENT: PERRLA, Neck supple, no JVD Lungs: Improving air movement, no expiratory wheezes, decreased breath sounds at both bases CVS: RRR,No Gallops,Rubs or new Murmurs Abd: soft, Non tender, non distended, BS present Extremities: No edema Skin: no new rashes Psychiatry: Judgement and insight appear normal. Mood & affect appropriate.     Data Reviewed:   CBC: Recent Labs  Lab 11/20/18 1927 11/20/18 2341 11/21/18 0220 11/22/18 0210 11/23/18 0328 11/24/18 0314  WBC 7.0 7.7 7.5 4.3 5.1 5.6  NEUTROABS 4.1  --   --   --   --   --  HGB 14.7 14.7 14.3 14.6 13.6 14.5  HCT 48.0 47.1 45.0 46.4 43.0 43.5  MCV 96.4 95.0 93.6 93.9 92.5 91.4  PLT 136* 184  183 190 153 532*   Basic Metabolic Panel: Recent Labs  Lab 11/20/18 1927 11/20/18 2341 11/21/18 0220 11/22/18 0210 11/23/18 0328 11/24/18 0314  NA 141  --  143 141 140 140  K 5.0  --  4.5 4.8 4.6 4.3  CL 105  --  107 105 108 106  CO2 17*  --  22 24 26 26   GLUCOSE 165*  --  156* 195* 119* 104*  BUN 60*  --  62* 62* 31* 21  CREATININE 2.73* 2.54* 2.37* 1.91* 1.16 0.98  CALCIUM 8.3*  --  7.5* 8.0* 8.1* 8.1*  MG  --   --  2.0  --   --   --   PHOS  --   --  5.9*  --   --   --    GFR: Estimated Creatinine Clearance: 75.7 mL/min (by C-G formula based on SCr of 0.98 mg/dL). Liver Function Tests: Recent Labs  Lab 11/20/18 1927 11/22/18 0210 11/23/18 0328 11/24/18 0314  AST 882* 740* 240* 112*  ALT 883* 1,006* 669* 452*  ALKPHOS 94 103 81 77  BILITOT 0.7 0.9 0.5 0.8  PROT 5.6* 5.7* 5.3* 5.5*  ALBUMIN 2.7* 2.7* 2.5* 2.5*   Recent Labs  Lab 11/20/18 1927  LIPASE 28   No results for input(s): AMMONIA in the last 168 hours. Coagulation Profile: No results for input(s): INR, PROTIME in the last 168 hours. Cardiac Enzymes: Recent Labs  Lab 11/20/18 1927 11/21/18 0706 11/21/18 1144 11/21/18 1833  TROPONINI 3.14* 2.98* 2.82* 2.40*   BNP (last 3 results) No results for input(s): PROBNP in the last 8760 hours. HbA1C: No results for input(s): HGBA1C in the last 72 hours. CBG: Recent Labs  Lab 11/23/18 1158 11/23/18 1645 11/23/18 2034 11/24/18 0732 11/24/18 1722  GLUCAP 134* 178* 234* 75 273*   Lipid Profile: No results for input(s): CHOL, HDL, LDLCALC, TRIG, CHOLHDL, LDLDIRECT in the last 72 hours. Thyroid Function Tests: No results for input(s): TSH, T4TOTAL, FREET4, T3FREE, THYROIDAB in the last 72 hours. Anemia Panel: No results for input(s): VITAMINB12, FOLATE, FERRITIN, TIBC, IRON, RETICCTPCT in the last 72 hours. Urine analysis:    Component Value Date/Time   COLORURINE AMBER (A) 11/21/2018 0211   APPEARANCEUR CLOUDY (A) 11/21/2018 0211   LABSPEC 1.020  11/21/2018 0211   PHURINE 5.0 11/21/2018 0211   GLUCOSEU NEGATIVE 11/21/2018 0211   HGBUR NEGATIVE 11/21/2018 0211   BILIRUBINUR NEGATIVE 11/21/2018 0211   KETONESUR NEGATIVE 11/21/2018 0211   PROTEINUR 30 (A) 11/21/2018 0211   UROBILINOGEN 0.2 12/19/2014 0821   NITRITE NEGATIVE 11/21/2018 0211   LEUKOCYTESUR NEGATIVE 11/21/2018 0211   Sepsis Labs: @LABRCNTIP (procalcitonin:4,lacticidven:4)  ) Recent Results (from the past 240 hour(s))  Culture, blood (Routine X 2) w Reflex to ID Panel     Status: Abnormal   Collection Time: 11/20/18  7:35 PM  Result Value Ref Range Status   Specimen Description BLOOD RIGHT ANTECUBITAL  Final   Special Requests   Final    BOTTLES DRAWN AEROBIC AND ANAEROBIC Blood Culture results may not be optimal due to an inadequate volume of blood received in culture bottles   Culture  Setup Time   Final    GRAM POSITIVE COCCI IN CHAINS AEROBIC BOTTLE ONLY CRITICAL RESULT CALLED TO, READ BACK BY AND VERIFIED WITH: Asencion Islam PharmD 17:20 11/21/18 ([email or web address])  Culture (A)  Final    VIRIDANS STREPTOCOCCUS THE SIGNIFICANCE OF ISOLATING THIS ORGANISM FROM A SINGLE SET OF BLOOD CULTURES WHEN MULTIPLE SETS ARE DRAWN IS UNCERTAIN. PLEASE NOTIFY THE MICROBIOLOGY DEPARTMENT WITHIN ONE WEEK IF SPECIATION AND SENSITIVITIES ARE REQUIRED. Performed at [facility], [place] [address] [place], [place] [address]    Report Status 11/23/2018 FINAL  Final  Blood Culture ID Panel (Reflexed)     Status: Abnormal   Collection Time: 11/20/18  7:35 PM  Result Value Ref Range Status   Enterococcus species NOT DETECTED NOT DETECTED Final   Listeria monocytogenes NOT DETECTED NOT DETECTED Final   Staphylococcus species NOT DETECTED NOT DETECTED Final   Staphylococcus aureus (BCID) NOT DETECTED NOT DETECTED Final   Streptococcus species DETECTED (A) NOT DETECTED Final    Comment: Not Enterococcus species, Streptococcus agalactiae, Streptococcus pyogenes, or Streptococcus  pneumoniae. CRITICAL RESULT CALLED TO, READ BACK BY AND VERIFIED WITH: Asencion Islam PharmD 17:20 11/21/18 ([email or web address])    Streptococcus agalactiae NOT DETECTED NOT DETECTED Final   Streptococcus pneumoniae NOT DETECTED NOT DETECTED Final   Streptococcus pyogenes NOT DETECTED NOT DETECTED Final   Acinetobacter baumannii NOT DETECTED NOT DETECTED Final   Enterobacteriaceae species NOT DETECTED NOT DETECTED Final   Enterobacter cloacae complex NOT DETECTED NOT DETECTED Final   Escherichia coli NOT DETECTED NOT DETECTED Final   Klebsiella oxytoca NOT DETECTED NOT DETECTED Final   Klebsiella pneumoniae NOT DETECTED NOT DETECTED Final   Proteus species NOT DETECTED NOT DETECTED Final   Serratia marcescens NOT DETECTED NOT DETECTED Final   Haemophilus influenzae NOT DETECTED NOT DETECTED Final   Neisseria meningitidis NOT DETECTED NOT DETECTED Final   Pseudomonas aeruginosa NOT DETECTED NOT DETECTED Final   Candida albicans NOT DETECTED NOT DETECTED Final   Candida glabrata NOT DETECTED NOT DETECTED Final   Candida krusei NOT DETECTED NOT DETECTED Final   Candida parapsilosis NOT DETECTED NOT DETECTED Final   Candida tropicalis NOT DETECTED NOT DETECTED Final    Comment: Performed at [facility], [place] [address], [place], [place] [address]  Culture, blood (Routine X 2) w Reflex to ID Panel     Status: None (Preliminary result)   Collection Time: 11/20/18  7:50 PM  Result Value Ref Range Status   Specimen Description BLOOD LEFT HAND  Final   Special Requests   Final    BOTTLES DRAWN AEROBIC AND ANAEROBIC Blood Culture results may not be optimal due to an inadequate volume of blood received in culture bottles   Culture   Final    NO GROWTH 4 DAYS Performed at [facility], [place] [address] [place], [place] [address]    Report Status PENDING  Incomplete  MRSA PCR Screening     Status: None   Collection Time: 11/21/18  1:01 AM  Result Value Ref Range Status   MRSA by PCR NEGATIVE  NEGATIVE Final    Comment:        The GeneXpert MRSA Assay (FDA approved for NASAL specimens only), is one component of a comprehensive MRSA colonization surveillance program. It is not intended to diagnose MRSA infection nor to guide or monitor treatment for MRSA infections. Performed at [facility], [place] [address] [place],  [address]   Culture, Urine     Status: None   Collection Time: 11/21/18  2:11 AM  Result Value Ref Range Status   Specimen Description URINE, RANDOM  Final   Special Requests NONE  Final  Culture   Final    NO GROWTH Performed at [facility], [place] [address], [place], [place] [address]    Report Status 11/22/2018 FINAL  Final  Culture, blood (routine x 2)     Status: None (Preliminary result)   Collection Time: 11/21/18  9:41 PM  Result Value Ref Range Status   Specimen Description BLOOD BLOOD RIGHT FOREARM  Final   Special Requests   Final    BOTTLES DRAWN AEROBIC ONLY Blood Culture adequate volume   Culture   Final    NO GROWTH 3 DAYS Performed at [facility], [place] [address], [place], [place] [address]    Report Status PENDING  Incomplete  Culture, blood (routine x 2)     Status: None (Preliminary result)   Collection Time: 11/21/18  9:41 PM  Result Value Ref Range Status   Specimen Description BLOOD BLOOD LEFT FOREARM  Final   Special Requests   Final    BOTTLES DRAWN AEROBIC ONLY Blood Culture adequate volume   Culture   Final    NO GROWTH 3 DAYS Performed at [facility], [place] [address], [place], [place] [address]    Report Status PENDING  Incomplete         Radiology Studies: No results found.      Scheduled Meds: . amLODipine  5 mg Oral Daily  . apixaban  5 mg Oral BID  . carvedilol  12.5 mg Oral BID WC  . furosemide  40 mg Oral Daily  . insulin aspart  0-5 Units Subcutaneous QHS  . insulin aspart  0-9 Units Subcutaneous TID WC  . ipratropium-albuterol  3 mL Nebulization TID  . [START ON  11/25/2018] lisinopril  20 mg Oral Daily  . mouth rinse  15 mL Mouth Rinse BID  . predniSONE  30 mg Oral Q breakfast   Continuous Infusions:    LOS: 4 days    Time spent: 64min    Author:  Berle Mull, MD [facility] 11/24/2018  If 7PM-7AM, please contact night-coverage To reach On-call, see [email or web address]  11/24/2018, 6:53 PM

## 2018-11-25 LAB — GLUCOSE, CAPILLARY
GLUCOSE-CAPILLARY: 106 mg/dL — AB (ref 70–99)
Glucose-Capillary: 220 mg/dL — ABNORMAL HIGH (ref 70–99)
Glucose-Capillary: 236 mg/dL — ABNORMAL HIGH (ref 70–99)
Glucose-Capillary: 253 mg/dL — ABNORMAL HIGH (ref 70–99)

## 2018-11-25 LAB — COMPREHENSIVE METABOLIC PANEL
ALT: 307 U/L — ABNORMAL HIGH (ref 0–44)
ANION GAP: 8 (ref 5–15)
AST: 70 U/L — ABNORMAL HIGH (ref 15–41)
Albumin: 2.6 g/dL — ABNORMAL LOW (ref 3.5–5.0)
Alkaline Phosphatase: 75 U/L (ref 38–126)
BUN: 16 mg/dL (ref 8–23)
CHLORIDE: 105 mmol/L (ref 98–111)
CO2: 27 mmol/L (ref 22–32)
Calcium: 8.3 mg/dL — ABNORMAL LOW (ref 8.9–10.3)
Creatinine, Ser: 0.96 mg/dL (ref 0.61–1.24)
GFR calc Af Amer: >60 mL/min (ref >60)
GFR calc non Af Amer: >60 mL/min (ref >60)
Glucose, Bld: 92 mg/dL (ref 70–99)
POTASSIUM: 4.1 mmol/L (ref 3.5–5.1)
Sodium: 140 mmol/L (ref 135–145)
Total Bilirubin: 0.7 mg/dL (ref 0.3–1.2)
Total Protein: 5.4 g/dL — ABNORMAL LOW (ref 6.5–8.1)

## 2018-11-25 LAB — CULTURE, BLOOD (ROUTINE X 2): Culture: NO GROWTH

## 2018-11-25 LAB — PROTIME-INR
INR: 1.13
Prothrombin Time: 14.4 seconds (ref 11.4–15.2)

## 2018-11-25 MED ORDER — FUROSEMIDE 10 MG/ML IJ SOLN
40.0000 mg | Freq: Every day | INTRAMUSCULAR | Status: DC
  Administered 2018-11-25: 40 mg via INTRAVENOUS
  Filled 2018-11-25: qty 4

## 2018-11-25 NOTE — Progress Notes (Signed)
PROGRESS NOTE    Kyle [name]  WHQ:[id number] DOB: Mar 03, 1951 DOA: 11/20/2018 PCP: Alonna Buckler, MD  Brief Narrative: 68 year old male with history of nonischemic cardiomyopathy, moderate pulmonary hypertension, COPD, PAD status post femoropopliteal bypass and aortobifemoral bypass in 2016 was admitted to the ICU on 1/26 with hypotension, malaise, nausea vomiting cough congestion night sweats chills and syncope the day of admission. -Treated with antibiotics, fluid resuscitation and pressors -Also being followed by cardiology for atrial fibrillation and cardiomyopathy -Transferred from PCCM to [facility] service 1/28  Assessment & Plan:   Shock -Resolved, per PCCM probably hypovolemic shock due to nausea vomiting decreased oral intake -Treated with fluid resuscitation and pressors in ICU -Improved -Blood cultures on admission 1/26, 1/2 bottles with strep viridans -Did not have fevers or leukocytosis on admission, did have some cough and congestion, discussed with ID Dr.Comer could have been transient bacteremia from oral source, repeat cultures are negative, afebrile nontoxic, recommended stopping Abx  -2D echocardiogram unremarkable as well -will discontinue ceftriaxone and monitor  Acute hypoxic respiratory failure. Recently treated COPD exacerbation -improving -Antibiotics as above, add duo nebs, steroid taper  Acute kidney injury -Likely due to hypoperfusion/hypotension, creatinine improving was 2.7 on admission, down to 1.9 now -off IVF now, creatinine has normalized  Diabetes mellitus  Transaminitis -Probably shock liver, LFTs improving, he was hypotensive on admission  Acute on chronic combined systolic and diastolic CHF. Pulmonary hypertension. -ECHO with EF of 40 to 45%, mild left ear and moderate AR, unchanged from prior -No coronary disease disease based on prior cath -appears slightly vol overloaded, treated with IV Lasix.  Monitor.  Paroxysmal atrial  fibrillation -Back in sinus rhythm now, carvedilol on hold with hypotension, -restarted coreg, and newly started on Apixaban  History of bicuspid aortic valve, mild AS, moderate AR -Outpatient follow-up  hypertension with urgency. Blood pressure remained stable elevated. Resume Norvasc, Coreg dose adjusted We will increase lisinopril dose.  Monitor.  DVT prophylaxis: Lovenox Code Status: Full code Family Communication: No family at bedside Disposition Plan: Home pending clinical improvement in respiratory status, bacteremia etc.  Consultants:   Cardiology  PCCM   Procedures:   Antimicrobials:    Subjective: Patient denies any acute complaint he remains severely hypoxic and short of breath.  No nausea no vomiting.  No chest pain.  Diarrhea.  Objective: Vitals:   11/25/18 1120 11/25/18 1205 11/25/18 1410 11/25/18 1658  BP: (!) 145/67 (!) 153/76  134/78  Pulse: 64 60 60 (!) 52  Resp: 16 17 18 15   Temp:  98 F (36.7 C)    TempSrc:  Oral    SpO2: (!) 85% 91% 94% 97%  Weight:      Height:        Intake/Output Summary (Last 24 hours) at 11/25/2018 1914 Last data filed at 11/25/2018 1403 Gross per 24 hour  Intake 480 ml  Output 1100 ml  Net -620 ml   Filed Weights   11/21/18 2122 11/22/18 0500 11/24/18 0411  Weight: 79.1 kg 79.1 kg 72.2 kg    Examination:  Gen: Awake, Alert, Oriented X 3, mild distress today HEENT: PERRLA, Neck supple, no JVD Lungs: Improving air movement, no expiratory wheezes, decreased breath sounds at both bases bilateral basal crackles. CVS: RRR,No Gallops,Rubs or new Murmurs Abd: soft, Non tender, non distended, BS present Extremities: No edema Skin: no new rashes Psychiatry: Judgement and insight appear normal. Mood & affect appropriate.     Data Reviewed:   CBC: Recent Labs  Lab  11/20/18 1927 11/20/18 2341 11/21/18 0220 11/22/18 0210 11/23/18 0328 11/24/18 0314  WBC 7.0 7.7 7.5 4.3 5.1 5.6  NEUTROABS 4.1  --   --   --    --   --   HGB 14.7 14.7 14.3 14.6 13.6 14.5  HCT 48.0 47.1 45.0 46.4 43.0 43.5  MCV 96.4 95.0 93.6 93.9 92.5 91.4  PLT 136* 184 183 190 153 283*   Basic Metabolic Panel: Recent Labs  Lab 11/21/18 0220 11/22/18 0210 11/23/18 0328 11/24/18 0314 11/25/18 0741  NA 143 141 140 140 140  K 4.5 4.8 4.6 4.3 4.1  CL 107 105 108 106 105  CO2 22 24 26 26 27   GLUCOSE 156* 195* 119* 104* 92  BUN 62* 62* 31* 21 16  CREATININE 2.37* 1.91* 1.16 0.98 0.96  CALCIUM 7.5* 8.0* 8.1* 8.1* 8.3*  MG 2.0  --   --   --   --   PHOS 5.9*  --   --   --   --    GFR: Estimated Creatinine Clearance: 77.3 mL/min (by C-G formula based on SCr of 0.96 mg/dL). Liver Function Tests: Recent Labs  Lab 11/20/18 1927 11/22/18 0210 11/23/18 0328 11/24/18 0314 11/25/18 0741  AST 882* 740* 240* 112* 70*  ALT 883* 1,006* 669* 452* 307*  ALKPHOS 94 103 81 77 75  BILITOT 0.7 0.9 0.5 0.8 0.7  PROT 5.6* 5.7* 5.3* 5.5* 5.4*  ALBUMIN 2.7* 2.7* 2.5* 2.5* 2.6*   Recent Labs  Lab 11/20/18 1927  LIPASE 28   No results for input(s): AMMONIA in the last 168 hours. Coagulation Profile: Recent Labs  Lab 11/25/18 0741  INR 1.13   Cardiac Enzymes: Recent Labs  Lab 11/20/18 1927 11/21/18 0706 11/21/18 1144 11/21/18 1833  TROPONINI 3.14* 2.98* 2.82* 2.40*   BNP (last 3 results) No results for input(s): PROBNP in the last 8760 hours. HbA1C: No results for input(s): HGBA1C in the last 72 hours. CBG: Recent Labs  Lab 11/24/18 1722 11/24/18 2134 11/25/18 0732 11/25/18 1207 11/25/18 1700  GLUCAP 273* 114* 106* 220* 236*   Lipid Profile: No results for input(s): CHOL, HDL, LDLCALC, TRIG, CHOLHDL, LDLDIRECT in the last 72 hours. Thyroid Function Tests: No results for input(s): TSH, T4TOTAL, FREET4, T3FREE, THYROIDAB in the last 72 hours. Anemia Panel: No results for input(s): VITAMINB12, FOLATE, FERRITIN, TIBC, IRON, RETICCTPCT in the last 72 hours. Urine analysis:    Component Value Date/Time    COLORURINE AMBER (A) 11/21/2018 0211   APPEARANCEUR CLOUDY (A) 11/21/2018 0211   LABSPEC 1.020 11/21/2018 0211   PHURINE 5.0 11/21/2018 0211   GLUCOSEU NEGATIVE 11/21/2018 0211   HGBUR NEGATIVE 11/21/2018 0211   BILIRUBINUR NEGATIVE 11/21/2018 0211   KETONESUR NEGATIVE 11/21/2018 0211   PROTEINUR 30 (A) 11/21/2018 0211   UROBILINOGEN 0.2 12/19/2014 0821   NITRITE NEGATIVE 11/21/2018 0211   LEUKOCYTESUR NEGATIVE 11/21/2018 0211   Sepsis Labs: @LABRCNTIP (procalcitonin:4,lacticidven:4)  ) Recent Results (from the past 240 hour(s))  Culture, blood (Routine X 2) w Reflex to ID Panel     Status: Abnormal   Collection Time: 11/20/18  7:35 PM  Result Value Ref Range Status   Specimen Description BLOOD RIGHT ANTECUBITAL  Final   Special Requests   Final    BOTTLES DRAWN AEROBIC AND ANAEROBIC Blood Culture results may not be optimal due to an inadequate volume of blood received in culture bottles   Culture  Setup Time   Final    GRAM POSITIVE COCCI IN CHAINS AEROBIC BOTTLE  ONLY CRITICAL RESULT CALLED TO, READ BACK BY AND VERIFIED WITH: Asencion Islam PharmD 17:20 11/21/18 ([email or web address])    Culture (A)  Final    VIRIDANS STREPTOCOCCUS THE SIGNIFICANCE OF ISOLATING THIS ORGANISM FROM A SINGLE SET OF BLOOD CULTURES WHEN MULTIPLE SETS ARE DRAWN IS UNCERTAIN. PLEASE NOTIFY THE MICROBIOLOGY DEPARTMENT WITHIN ONE WEEK IF SPECIATION AND SENSITIVITIES ARE REQUIRED. Performed at [facility], [place] [address], [place], [place] [address]    Report Status 11/23/2018 FINAL  Final  Blood Culture ID Panel (Reflexed)     Status: Abnormal   Collection Time: 11/20/18  7:35 PM  Result Value Ref Range Status   Enterococcus species NOT DETECTED NOT DETECTED Final   Listeria monocytogenes NOT DETECTED NOT DETECTED Final   Staphylococcus species NOT DETECTED NOT DETECTED Final   Staphylococcus aureus (BCID) NOT DETECTED NOT DETECTED Final   Streptococcus species DETECTED (A) NOT DETECTED Final    Comment: Not  Enterococcus species, Streptococcus agalactiae, Streptococcus pyogenes, or Streptococcus pneumoniae. CRITICAL RESULT CALLED TO, READ BACK BY AND VERIFIED WITH: Asencion Islam PharmD 17:20 11/21/18 ([email or web address])    Streptococcus agalactiae NOT DETECTED NOT DETECTED Final   Streptococcus pneumoniae NOT DETECTED NOT DETECTED Final   Streptococcus pyogenes NOT DETECTED NOT DETECTED Final   Acinetobacter baumannii NOT DETECTED NOT DETECTED Final   Enterobacteriaceae species NOT DETECTED NOT DETECTED Final   Enterobacter cloacae complex NOT DETECTED NOT DETECTED Final   Escherichia coli NOT DETECTED NOT DETECTED Final   Klebsiella oxytoca NOT DETECTED NOT DETECTED Final   Klebsiella pneumoniae NOT DETECTED NOT DETECTED Final   Proteus species NOT DETECTED NOT DETECTED Final   Serratia marcescens NOT DETECTED NOT DETECTED Final   Haemophilus influenzae NOT DETECTED NOT DETECTED Final   Neisseria meningitidis NOT DETECTED NOT DETECTED Final   Pseudomonas aeruginosa NOT DETECTED NOT DETECTED Final   Candida albicans NOT DETECTED NOT DETECTED Final   Candida glabrata NOT DETECTED NOT DETECTED Final   Candida krusei NOT DETECTED NOT DETECTED Final   Candida parapsilosis NOT DETECTED NOT DETECTED Final   Candida tropicalis NOT DETECTED NOT DETECTED Final    Comment: Performed at [facility], [place] [address], [place], [place] [address]  Culture, blood (Routine X 2) w Reflex to ID Panel     Status: None   Collection Time: 11/20/18  7:50 PM  Result Value Ref Range Status   Specimen Description BLOOD LEFT HAND  Final   Special Requests   Final    BOTTLES DRAWN AEROBIC AND ANAEROBIC Blood Culture results may not be optimal due to an inadequate volume of blood received in culture bottles   Culture   Final    NO GROWTH 5 DAYS Performed at [facility], [place] [address], [place], [place] [address]    Report Status 11/25/2018 FINAL  Final  MRSA PCR Screening     Status: None   Collection Time:  11/21/18  1:01 AM  Result Value Ref Range Status   MRSA by PCR NEGATIVE NEGATIVE Final    Comment:        The GeneXpert MRSA Assay (FDA approved for NASAL specimens only), is one component of a comprehensive MRSA colonization surveillance program. It is not intended to diagnose MRSA infection nor to guide or monitor treatment for MRSA infections. Performed at [facility], [place] [address], [place], [place] [address]   Culture, Urine     Status: None   Collection Time: 11/21/18  2:11 AM  Result  Value Ref Range Status   Specimen Description URINE, RANDOM  Final   Special Requests NONE  Final   Culture   Final    NO GROWTH Performed at [facility], [address] [place], [place] [address]    Report Status 11/22/2018 FINAL  Final  Culture, blood (routine x 2)     Status: None (Preliminary result)   Collection Time: 11/21/18  9:41 PM  Result Value Ref Range Status   Specimen Description BLOOD BLOOD RIGHT FOREARM  Final   Special Requests   Final    BOTTLES DRAWN AEROBIC ONLY Blood Culture adequate volume   Culture   Final    NO GROWTH 4 DAYS Performed at [facility], [place] [address] [place], [place] [address]    Report Status PENDING  Incomplete  Culture, blood (routine x 2)     Status: None (Preliminary result)   Collection Time: 11/21/18  9:41 PM  Result Value Ref Range Status   Specimen Description BLOOD BLOOD LEFT FOREARM  Final   Special Requests   Final    BOTTLES DRAWN AEROBIC ONLY Blood Culture adequate volume   Culture   Final    NO GROWTH 4 DAYS Performed at [facility], [place] [address] [place], [place] [address]    Report Status PENDING  Incomplete         Radiology Studies: No results found.      Scheduled Meds: . amLODipine  5 mg Oral Daily  . apixaban  5 mg Oral BID  . carvedilol  12.5 mg Oral BID WC  . furosemide  40 mg Intravenous Daily  . insulin aspart  0-5 Units Subcutaneous QHS  . insulin aspart  0-9 Units  Subcutaneous TID WC  . ipratropium-albuterol  3 mL Nebulization TID  . lisinopril  20 mg Oral Daily  . mouth rinse  15 mL Mouth Rinse BID   Continuous Infusions:    LOS: 5 days    Time spent: 18min    Author:  Berle Mull, MD [facility] 11/25/2018  If 7PM-7AM, please contact night-coverage To reach On-call, see [email or web address]  11/25/2018, 7:14 PM

## 2018-11-26 ENCOUNTER — Inpatient Hospital Stay (HOSPITAL_COMMUNITY): Payer: Medicare HMO

## 2018-11-26 ENCOUNTER — Encounter (HOSPITAL_COMMUNITY): Payer: Self-pay | Admitting: Radiology

## 2018-11-26 LAB — GLUCOSE, CAPILLARY
GLUCOSE-CAPILLARY: 98 mg/dL (ref 70–99)
Glucose-Capillary: 131 mg/dL — ABNORMAL HIGH (ref 70–99)
Glucose-Capillary: 145 mg/dL — ABNORMAL HIGH (ref 70–99)
Glucose-Capillary: 125 mg/dL — ABNORMAL HIGH (ref 70–99)

## 2018-11-26 LAB — CBC
HCT: 50.1 % (ref 39.0–52.0)
HEMOGLOBIN: 16.7 g/dL (ref 13.0–17.0)
MCH: 30.3 pg (ref 26.0–34.0)
MCHC: 33.3 g/dL (ref 30.0–36.0)
MCV: 90.8 fL (ref 80.0–100.0)
Platelets: 166 10*3/uL (ref 150–400)
RBC: 5.52 MIL/uL (ref 4.22–5.81)
RDW: 16 % — ABNORMAL HIGH (ref 11.5–15.5)
WBC: 8.8 10*3/uL (ref 4.0–10.5)
nRBC: 0.2 % (ref 0.0–0.2)

## 2018-11-26 LAB — CULTURE, BLOOD (ROUTINE X 2)
Culture: NO GROWTH
Culture: NO GROWTH
Special Requests: ADEQUATE
Special Requests: ADEQUATE

## 2018-11-26 LAB — BASIC METABOLIC PANEL
Anion gap: 11 (ref 5–15)
BUN: 19 mg/dL (ref 8–23)
CO2: 29 mmol/L (ref 22–32)
CREATININE: 0.94 mg/dL (ref 0.61–1.24)
Calcium: 9 mg/dL (ref 8.9–10.3)
Chloride: 100 mmol/L (ref 98–111)
GFR calc Af Amer: >60 mL/min (ref >60)
GFR calc non Af Amer: >60 mL/min (ref >60)
Glucose, Bld: 89 mg/dL (ref 70–99)
Potassium: 4.4 mmol/L (ref 3.5–5.1)
SODIUM: 140 mmol/L (ref 135–145)

## 2018-11-26 LAB — MAGNESIUM: Magnesium: 1.9 mg/dL (ref 1.7–2.4)

## 2018-11-26 MED ORDER — PREDNISONE 20 MG PO TABS
50.0000 mg | ORAL_TABLET | Freq: Every day | ORAL | Status: DC
  Administered 2018-11-26 – 2018-11-30 (×5): 50 mg via ORAL
  Filled 2018-11-26 (×5): qty 2

## 2018-11-26 MED ORDER — LEVALBUTEROL HCL 0.63 MG/3ML IN NEBU: 0.6300 mg | INHALATION_SOLUTION | Freq: Three times a day (TID) | RESPIRATORY_TRACT | Status: DC

## 2018-11-26 MED ORDER — GUAIFENESIN ER 600 MG PO TB12
1200.0000 mg | ORAL_TABLET | Freq: Two times a day (BID) | ORAL | Status: DC
  Administered 2018-11-26 – 2018-11-30 (×8): 1200 mg via ORAL
  Filled 2018-11-26 (×8): qty 2

## 2018-11-26 MED ORDER — LEVALBUTEROL HCL 0.63 MG/3ML IN NEBU
0.6300 mg | INHALATION_SOLUTION | RESPIRATORY_TRACT | Status: DC
  Administered 2018-11-26 – 2018-11-28 (×7): 0.63 mg via RESPIRATORY_TRACT
  Filled 2018-11-26 (×7): qty 3

## 2018-11-26 MED ORDER — FUROSEMIDE 10 MG/ML IJ SOLN
40.0000 mg | Freq: Two times a day (BID) | INTRAMUSCULAR | Status: DC
  Administered 2018-11-26: 40 mg via INTRAVENOUS
  Filled 2018-11-26: qty 4

## 2018-11-26 MED ORDER — IPRATROPIUM BROMIDE 0.02 % IN SOLN: 0.5000 mg | Freq: Three times a day (TID) | RESPIRATORY_TRACT | Status: DC

## 2018-11-26 MED ORDER — IPRATROPIUM BROMIDE 0.02 % IN SOLN
0.5000 mg | RESPIRATORY_TRACT | Status: DC
  Administered 2018-11-26 – 2018-11-28 (×7): 0.5 mg via RESPIRATORY_TRACT
  Filled 2018-11-26 (×7): qty 2.5

## 2018-11-26 MED ORDER — IOPAMIDOL (ISOVUE-370) INJECTION 76%
INTRAVENOUS | Status: AC
  Administered 2018-11-26: 50 mL
  Filled 2018-11-26: qty 100

## 2018-11-26 MED ORDER — FUROSEMIDE 10 MG/ML IJ SOLN
40.0000 mg | Freq: Every day | INTRAMUSCULAR | Status: DC
  Administered 2018-11-27: 40 mg via INTRAVENOUS
  Filled 2018-11-26: qty 4

## 2018-11-26 NOTE — Progress Notes (Signed)
PROGRESS NOTE    Kyle [name]  UXN:[id number] DOB: 1951-10-03 DOA: 11/20/2018 PCP: Alonna Buckler, MD  Brief Narrative: 68 year old male with history of nonischemic cardiomyopathy, moderate pulmonary hypertension, COPD, PAD status post femoropopliteal bypass and aortobifemoral bypass in 2016 was admitted to the ICU on 1/26 with hypotension, malaise, nausea vomiting cough congestion night sweats chills and syncope the day of admission. -Treated with antibiotics, fluid resuscitation and pressors -Also being followed by cardiology for atrial fibrillation and cardiomyopathy -Transferred from PCCM to [facility] service 1/28  Assessment & Plan:   Shock -Resolved, per PCCM probably hypovolemic shock due to nausea vomiting decreased oral intake -Treated with fluid resuscitation and pressors in ICU -Improved -Blood cultures on admission 1/26, 1/2 bottles with strep viridans -Did not have fevers or leukocytosis on admission, did have some cough and congestion, discussed with ID Dr.Comer could have been transient bacteremia from oral source, repeat cultures are negative, afebrile nontoxic, recommended stopping Abx  -2D echocardiogram unremarkable as well -will discontinue ceftriaxone and monitor  Acute hypoxic respiratory failure. Recently treated COPD exacerbation -Worsening -Antibiotics as above, add duo nebs, steroid taper Severely hypoxic right now. While the patient has completed his antibiotic treatment remains severely hypoxic and therefore CT scan of the PE was performed to rule out any pulmonary embolism which has been negative for PE. Show significant mucus plugging. Will use nebulizer as well as prednisone Mucinex and chest PT.  Acute kidney injury -Likely due to hypoperfusion/hypotension, creatinine improving was 2.7 on admission, down to 1.9 now -off IVF now, creatinine has normalized  Diabetes mellitus  Transaminitis -Probably shock liver, LFTs improving, he was hypotensive on  admission  Acute on chronic combined systolic and diastolic CHF. Pulmonary hypertension. -ECHO with EF of 40 to 45%, mild left ear and moderate AR, unchanged from prior -No coronary disease disease based on prior cath -appears slightly vol overloaded, treated with IV Lasix.  Monitor.  Paroxysmal atrial fibrillation -Back in sinus rhythm now, carvedilol on hold with hypotension, -restarted coreg, and newly started on Apixaban  History of bicuspid aortic valve, mild AS, moderate AR -Outpatient follow-up  hypertension with urgency. Blood pressure remained stable elevated. Resume Norvasc, Coreg dose adjusted We will increase lisinopril dose.  Monitor.  DVT prophylaxis: Lovenox Code Status: Full code Family Communication: No family at bedside Disposition Plan: Home pending clinical improvement in respiratory status, bacteremia etc.  Consultants:   Cardiology  PCCM   Procedures:   Antimicrobials:    Subjective: No acute complaints.  No acute events.  Objective: Vitals:   11/26/18 0837 11/26/18 1225 11/26/18 1644 11/26/18 1914  BP:  123/75 138/74 129/68  Pulse:  73 65 (!) 58  Resp:  18 (!) 28 16  Temp:  98.3 F (36.8 C) 97.6 F (36.4 C) 97.6 F (36.4 C)  TempSrc:  Oral Oral Oral  SpO2: 92% 94% (!) 87% 92%  Weight:      Height:        Intake/Output Summary (Last 24 hours) at 11/26/2018 1928 Last data filed at 11/26/2018 1225 Gross per 24 hour  Intake 120 ml  Output 1000 ml  Net -880 ml   Filed Weights   11/21/18 2122 11/22/18 0500 11/24/18 0411  Weight: 79.1 kg 79.1 kg 72.2 kg    Examination:  Gen: Awake, Alert, Oriented X 3, mild distress today HEENT: PERRLA, Neck supple, no JVD Lungs: Improving air movement, no expiratory wheezes, decreased breath sounds at both bases bilateral basal crackles. CVS: RRR,No Gallops,Rubs or new  Murmurs Abd: soft, Non tender, non distended, BS present Extremities: No edema Skin: no new rashes Psychiatry: Judgement and  insight appear normal. Mood & affect appropriate.     Data Reviewed:   CBC: Recent Labs  Lab 11/20/18 1927  11/21/18 0220 11/22/18 0210 11/23/18 0328 11/24/18 0314 11/26/18 0355  WBC 7.0   < > 7.5 4.3 5.1 5.6 8.8  NEUTROABS 4.1  --   --   --   --   --   --   HGB 14.7   < > 14.3 14.6 13.6 14.5 16.7  HCT 48.0   < > 45.0 46.4 43.0 43.5 50.1  MCV 96.4   < > 93.6 93.9 92.5 91.4 90.8  PLT 136*   < > 183 190 153 142* 166   < > = values in this interval not displayed.   Basic Metabolic Panel: Recent Labs  Lab 11/21/18 0220 11/22/18 0210 11/23/18 0328 11/24/18 0314 11/25/18 0741 11/26/18 0355  NA 143 141 140 140 140 140  K 4.5 4.8 4.6 4.3 4.1 4.4  CL 107 105 108 106 105 100  CO2 22 24 26 26 27 29   GLUCOSE 156* 195* 119* 104* 92 89  BUN 62* 62* 31* 21 16 19   CREATININE 2.37* 1.91* 1.16 0.98 0.96 0.94  CALCIUM 7.5* 8.0* 8.1* 8.1* 8.3* 9.0  MG 2.0  --   --   --   --  1.9  PHOS 5.9*  --   --   --   --   --    GFR: Estimated Creatinine Clearance: 78.9 mL/min (by C-G formula based on SCr of 0.94 mg/dL). Liver Function Tests: Recent Labs  Lab 11/20/18 1927 11/22/18 0210 11/23/18 0328 11/24/18 0314 11/25/18 0741  AST 882* 740* 240* 112* 70*  ALT 883* 1,006* 669* 452* 307*  ALKPHOS 94 103 81 77 75  BILITOT 0.7 0.9 0.5 0.8 0.7  PROT 5.6* 5.7* 5.3* 5.5* 5.4*  ALBUMIN 2.7* 2.7* 2.5* 2.5* 2.6*   Recent Labs  Lab 11/20/18 1927  LIPASE 28   No results for input(s): AMMONIA in the last 168 hours. Coagulation Profile: Recent Labs  Lab 11/25/18 0741  INR 1.13   Cardiac Enzymes: Recent Labs  Lab 11/20/18 1927 11/21/18 0706 11/21/18 1144 11/21/18 1833  TROPONINI 3.14* 2.98* 2.82* 2.40*   BNP (last 3 results) No results for input(s): PROBNP in the last 8760 hours. HbA1C: No results for input(s): HGBA1C in the last 72 hours. CBG: Recent Labs  Lab 11/25/18 1700 11/25/18 2143 11/26/18 0813 11/26/18 1223 11/26/18 1644  GLUCAP 236* 253* 98 131* 145*   Lipid  Profile: No results for input(s): CHOL, HDL, LDLCALC, TRIG, CHOLHDL, LDLDIRECT in the last 72 hours. Thyroid Function Tests: No results for input(s): TSH, T4TOTAL, FREET4, T3FREE, THYROIDAB in the last 72 hours. Anemia Panel: No results for input(s): VITAMINB12, FOLATE, FERRITIN, TIBC, IRON, RETICCTPCT in the last 72 hours. Urine analysis:    Component Value Date/Time   COLORURINE AMBER (A) 11/21/2018 0211   APPEARANCEUR CLOUDY (A) 11/21/2018 0211   LABSPEC 1.020 11/21/2018 0211   PHURINE 5.0 11/21/2018 0211   GLUCOSEU NEGATIVE 11/21/2018 0211   HGBUR NEGATIVE 11/21/2018 0211   BILIRUBINUR NEGATIVE 11/21/2018 0211   KETONESUR NEGATIVE 11/21/2018 0211   PROTEINUR 30 (A) 11/21/2018 0211   UROBILINOGEN 0.2 12/19/2014 0821   NITRITE NEGATIVE 11/21/2018 0211   LEUKOCYTESUR NEGATIVE 11/21/2018 0211   Sepsis Labs: @LABRCNTIP (procalcitonin:4,lacticidven:4)  ) Recent Results (from the past 240 hour(s))  Culture, blood (Routine  X 2) w Reflex to ID Panel     Status: Abnormal   Collection Time: 11/20/18  7:35 PM  Result Value Ref Range Status   Specimen Description BLOOD RIGHT ANTECUBITAL  Final   Special Requests   Final    BOTTLES DRAWN AEROBIC AND ANAEROBIC Blood Culture results may not be optimal due to an inadequate volume of blood received in culture bottles   Culture  Setup Time   Final    GRAM POSITIVE COCCI IN CHAINS AEROBIC BOTTLE ONLY CRITICAL RESULT CALLED TO, READ BACK BY AND VERIFIED WITH: Asencion Islam PharmD 17:20 11/21/18 ([email or web address])    Culture (A)  Final    VIRIDANS STREPTOCOCCUS THE SIGNIFICANCE OF ISOLATING THIS ORGANISM FROM A SINGLE SET OF BLOOD CULTURES WHEN MULTIPLE SETS ARE DRAWN IS UNCERTAIN. PLEASE NOTIFY THE MICROBIOLOGY DEPARTMENT WITHIN ONE WEEK IF SPECIATION AND SENSITIVITIES ARE REQUIRED. Performed at [facility], [place] [address], [place], [place] [address]    Report Status 11/23/2018 FINAL  Final  Blood Culture ID Panel (Reflexed)     Status: Abnormal    Collection Time: 11/20/18  7:35 PM  Result Value Ref Range Status   Enterococcus species NOT DETECTED NOT DETECTED Final   Listeria monocytogenes NOT DETECTED NOT DETECTED Final   Staphylococcus species NOT DETECTED NOT DETECTED Final   Staphylococcus aureus (BCID) NOT DETECTED NOT DETECTED Final   Streptococcus species DETECTED (A) NOT DETECTED Final    Comment: Not Enterococcus species, Streptococcus agalactiae, Streptococcus pyogenes, or Streptococcus pneumoniae. CRITICAL RESULT CALLED TO, READ BACK BY AND VERIFIED WITH: Asencion Islam PharmD 17:20 11/21/18 ([email or web address])    Streptococcus agalactiae NOT DETECTED NOT DETECTED Final   Streptococcus pneumoniae NOT DETECTED NOT DETECTED Final   Streptococcus pyogenes NOT DETECTED NOT DETECTED Final   Acinetobacter baumannii NOT DETECTED NOT DETECTED Final   Enterobacteriaceae species NOT DETECTED NOT DETECTED Final   Enterobacter cloacae complex NOT DETECTED NOT DETECTED Final   Escherichia coli NOT DETECTED NOT DETECTED Final   Klebsiella oxytoca NOT DETECTED NOT DETECTED Final   Klebsiella pneumoniae NOT DETECTED NOT DETECTED Final   Proteus species NOT DETECTED NOT DETECTED Final   Serratia marcescens NOT DETECTED NOT DETECTED Final   Haemophilus influenzae NOT DETECTED NOT DETECTED Final   Neisseria meningitidis NOT DETECTED NOT DETECTED Final   Pseudomonas aeruginosa NOT DETECTED NOT DETECTED Final   Candida albicans NOT DETECTED NOT DETECTED Final   Candida glabrata NOT DETECTED NOT DETECTED Final   Candida krusei NOT DETECTED NOT DETECTED Final   Candida parapsilosis NOT DETECTED NOT DETECTED Final   Candida tropicalis NOT DETECTED NOT DETECTED Final    Comment: Performed at [facility], [place] [address], [place], [place] [address]  Culture, blood (Routine X 2) w Reflex to ID Panel     Status: None   Collection Time: 11/20/18  7:50 PM  Result Value Ref Range Status   Specimen Description BLOOD LEFT HAND  Final   Special  Requests   Final    BOTTLES DRAWN AEROBIC AND ANAEROBIC Blood Culture results may not be optimal due to an inadequate volume of blood received in culture bottles   Culture   Final    NO GROWTH 5 DAYS Performed at [facility], [place] [address], [place], [place] [address]    Report Status 11/25/2018 FINAL  Final  MRSA PCR Screening     Status: None   Collection Time: 11/21/18  1:01 AM  Result Value Ref Range Status  MRSA by PCR NEGATIVE NEGATIVE Final    Comment:        The GeneXpert MRSA Assay (FDA approved for NASAL specimens only), is one component of a comprehensive MRSA colonization surveillance program. It is not intended to diagnose MRSA infection nor to guide or monitor treatment for MRSA infections. Performed at [facility], [place] [address] [place], [place] [address]   Culture, Urine     Status: None   Collection Time: 11/21/18  2:11 AM  Result Value Ref Range Status   Specimen Description URINE, RANDOM  Final   Special Requests NONE  Final   Culture   Final    NO GROWTH Performed at [facility], [place] [address] [place], [place] [address]    Report Status 11/22/2018 FINAL  Final  Culture, blood (routine x 2)     Status: None   Collection Time: 11/21/18  9:41 PM  Result Value Ref Range Status   Specimen Description BLOOD BLOOD RIGHT FOREARM  Final   Special Requests   Final    BOTTLES DRAWN AEROBIC ONLY Blood Culture adequate volume   Culture NO GROWTH 5 DAYS  Final   Report Status 11/26/2018 FINAL  Final  Culture, blood (routine x 2)     Status: None   Collection Time: 11/21/18  9:41 PM  Result Value Ref Range Status   Specimen Description BLOOD BLOOD LEFT FOREARM  Final   Special Requests   Final    BOTTLES DRAWN AEROBIC ONLY Blood Culture adequate volume   Culture NO GROWTH 5 DAYS  Final   Report Status 11/26/2018 FINAL  Final         Radiology Studies: Ct Angio Chest Pe W Or Wo Contrast  Result Date: 11/26/2018 CLINICAL DATA:   Shortness of breath and hypoxia. EXAM: CT ANGIOGRAPHY CHEST WITH CONTRAST TECHNIQUE: Multidetector CT imaging of the chest was performed using the standard protocol during bolus administration of intravenous contrast. Multiplanar CT image reconstructions and MIPs were obtained to evaluate the vascular anatomy. CONTRAST:  13mL ISOVUE-370 IOPAMIDOL (ISOVUE-370) INJECTION 76% COMPARISON:  Current chest radiograph and prior chest radiographs. FINDINGS: Cardiovascular: The pulmonary arteries are well opacified to the segmental level. There is no evidence of a pulmonary embolism. Moderate enlargement of the heart. Trace pericardial effusion. Two vessel coronary artery calcifications. Aorta is normal caliber. There are aortic atherosclerotic calcifications. Aorta is not opacified. Mediastinum/Nodes: No neck base or axillary masses or pathologically enlarged lymph nodes. There are prominent mediastinal lymph nodes, largest a left para carinal node measuring 12 mm short axis. No mediastinal or hilar masses. Trachea and esophagus are unremarkable. Lungs/Pleura: Trace pleural effusions, more on the right. Bronchial wall thickening, most evident in the lower lobes, with secretions occluding several subsegmental bronchi. There is dependent opacity in both lower lobes consistent with atelectasis, greater on the right. Lungs demonstrate advanced changes of centrilobular emphysema. No mass or suspicious nodule. No evidence of pulmonary edema. No pneumothorax. Upper Abdomen: No acute abnormality. Musculoskeletal: No fracture or acute finding. No osteoblastic or osteolytic lesions. Review of the MIP images confirms the above findings. IMPRESSION: 1. No evidence of a pulmonary embolism. 2. Trace pleural effusions. Bronchial wall thickening with several subsegmental lower lobe bronchi occluded with secretions/mucus. There is dependent lower lobe atelectasis, right greater than left. No convincing pneumonia. 3. Advanced centrilobular  emphysema. 4. No convincing pulmonary edema. Aortic Atherosclerosis (ICD10-I70.0) and Emphysema (ICD10-J43.9). Electronically Signed   By: Lajean Manes M.D.   On: 11/26/2018  18:11        Scheduled Meds: . amLODipine  5 mg Oral Daily  . apixaban  5 mg Oral BID  . carvedilol  12.5 mg Oral BID WC  . [START ON 11/27/2018] furosemide  40 mg Intravenous Daily  . insulin aspart  0-5 Units Subcutaneous QHS  . insulin aspart  0-9 Units Subcutaneous TID WC  . lisinopril  20 mg Oral Daily  . mouth rinse  15 mL Mouth Rinse BID   Continuous Infusions:    LOS: 6 days    Time spent: 62min    Author:  Berle Mull, MD [facility] 11/26/2018  If 7PM-7AM, please contact night-coverage To reach On-call, see [email or web address]  11/26/2018, 7:28 PM

## 2018-11-27 LAB — GLUCOSE, CAPILLARY
GLUCOSE-CAPILLARY: 275 mg/dL — AB (ref 70–99)
Glucose-Capillary: 271 mg/dL — ABNORMAL HIGH (ref 70–99)
Glucose-Capillary: 234 mg/dL — ABNORMAL HIGH (ref 70–99)
Glucose-Capillary: 319 mg/dL — ABNORMAL HIGH (ref 70–99)
Glucose-Capillary: 321 mg/dL — ABNORMAL HIGH (ref 70–99)
Glucose-Capillary: 240 mg/dL — ABNORMAL HIGH (ref 70–99)

## 2018-11-27 LAB — CBC
HEMATOCRIT: 48.2 % (ref 39.0–52.0)
Hemoglobin: 16 g/dL (ref 13.0–17.0)
MCH: 29.9 pg (ref 26.0–34.0)
MCHC: 33.2 g/dL (ref 30.0–36.0)
MCV: 90.1 fL (ref 80.0–100.0)
Platelets: 220 10*3/uL (ref 150–400)
RBC: 5.35 MIL/uL (ref 4.22–5.81)
RDW: 16.1 % — AB (ref 11.5–15.5)
WBC: 7.7 10*3/uL (ref 4.0–10.5)
nRBC: 0 % (ref 0.0–0.2)

## 2018-11-27 LAB — BASIC METABOLIC PANEL
Anion gap: 10 (ref 5–15)
BUN: 24 mg/dL — ABNORMAL HIGH (ref 8–23)
CO2: 25 mmol/L (ref 22–32)
CREATININE: 1.07 mg/dL (ref 0.61–1.24)
Calcium: 9 mg/dL (ref 8.9–10.3)
Chloride: 103 mmol/L (ref 98–111)
GFR calc Af Amer: >60 mL/min (ref >60)
GFR calc non Af Amer: >60 mL/min (ref >60)
Glucose, Bld: 208 mg/dL — ABNORMAL HIGH (ref 70–99)
Potassium: 4.9 mmol/L (ref 3.5–5.1)
Sodium: 138 mmol/L (ref 135–145)

## 2018-11-27 MED ORDER — INSULIN ASPART 100 UNIT/ML ~~LOC~~ SOLN
0.0000 [IU] | Freq: Three times a day (TID) | SUBCUTANEOUS | Status: DC
  Administered 2018-11-28: 5 [IU] via SUBCUTANEOUS
  Administered 2018-11-28 (×2): 3 [IU] via SUBCUTANEOUS
  Administered 2018-11-29 (×2): 5 [IU] via SUBCUTANEOUS
  Administered 2018-11-29: 2 [IU] via SUBCUTANEOUS

## 2018-11-27 NOTE — Progress Notes (Signed)
PROGRESS NOTE    SONYA PUCCI  MKL:[id number] DOB: 12-25-1950 DOA: 11/20/2018 PCP: Alonna Buckler, MD  Brief Narrative: 68 year old male with history of nonischemic cardiomyopathy, moderate pulmonary hypertension, COPD, PAD status post femoropopliteal bypass and aortobifemoral bypass in 2016 was admitted to the ICU on 1/26 with hypotension, malaise, nausea vomiting cough congestion night sweats chills and syncope the day of admission. -Treated with antibiotics, fluid resuscitation and pressors -Also being followed by cardiology for atrial fibrillation and cardiomyopathy -Transferred from PCCM to [facility] service 1/28  Assessment & Plan:   Shock -Resolved, per PCCM probably hypovolemic shock due to nausea vomiting decreased oral intake -Treated with fluid resuscitation and pressors in ICU -Improved -Blood cultures on admission 1/26, 1/2 bottles with strep viridans -Did not have fevers or leukocytosis on admission, did have some cough and congestion, discussed with ID Dr.Comer could have been transient bacteremia from oral source, repeat cultures are negative, afebrile nontoxic, recommended stopping Abx  -2D echocardiogram unremarkable as well -will discontinue ceftriaxone and monitor  Acute hypoxic respiratory failure. Recently treated COPD exacerbation -Worsening -Antibiotics as above, add duo nebs, steroid taper Severely hypoxic right now. While the patient has completed his antibiotic treatment remains severely hypoxic and therefore CT scan of the PE was performed to rule out any pulmonary embolism which has been negative for PE. Show significant mucus plugging. Will use nebulizer as well as prednisone Mucinex and chest PT.  Acute kidney injury -Likely due to hypoperfusion/hypotension, creatinine improving was 2.7 on admission, down to 1.9 now -off IVF now, creatinine has normalized  Diabetes mellitus  Transaminitis -Probably shock liver, LFTs improving, he was hypotensive on  admission  Acute on chronic combined systolic and diastolic CHF. Pulmonary hypertension. -ECHO with EF of 40 to 45%, mild left ear and moderate AR, unchanged from prior -No coronary disease disease based on prior cath -appears slightly vol overloaded, treated with IV Lasix.  Monitor.  Paroxysmal atrial fibrillation -Back in sinus rhythm now, carvedilol on hold with hypotension, -restarted coreg, and newly started on Apixaban  History of bicuspid aortic valve, mild AS, moderate AR -Outpatient follow-up  hypertension with urgency. Blood pressure remained stable elevated. Resume Norvasc, Coreg dose adjusted We will increase lisinopril dose.  Monitor.  DVT prophylaxis: Lovenox Code Status: Full code Family Communication: No family at bedside Disposition Plan: Home pending clinical improvement in respiratory status, bacteremia etc.  Consultants:   Cardiology  PCCM   Procedures:   Antimicrobials:    Subjective: Severely hypoxic.  Went to as low as 8 L of oxygen.  No nausea no vomiting.  Appears in mild distress.  Objective: Vitals:   11/27/18 1150 11/27/18 1453 11/27/18 1636 11/27/18 1716  BP: 138/74  136/69 (!) 144/78  Pulse: (!) 58  67 64  Resp: 17   16  Temp: 98 F (36.7 C)  98.1 F (36.7 C) (!) 97.5 F (36.4 C)  TempSrc: Oral  Oral Oral  SpO2: 92% 90% 91% 95%  Weight:      Height:        Intake/Output Summary (Last 24 hours) at 11/27/2018 1726 Last data filed at 11/27/2018 1200 Gross per 24 hour  Intake -  Output 1050 ml  Net -1050 ml   Filed Weights   11/21/18 2122 11/22/18 0500 11/24/18 0411  Weight: 79.1 kg 79.1 kg 72.2 kg    Examination:  Gen: Awake, Alert, Oriented X 3, mild distress today HEENT: PERRLA, Neck supple, no JVD Lungs: Improving air movement, no expiratory wheezes,  decreased breath sounds at both bases bilateral basal crackles. CVS: RRR,No Gallops,Rubs or new Murmurs Abd: soft, Non tender, non distended, BS present Extremities: No  edema Skin: no new rashes Psychiatry: Judgement and insight appear normal. Mood & affect appropriate.     Data Reviewed:   CBC: Recent Labs  Lab 11/20/18 1927  11/22/18 0210 11/23/18 0328 11/24/18 0314 11/26/18 0355 11/27/18 0340  WBC 7.0   < > 4.3 5.1 5.6 8.8 7.7  NEUTROABS 4.1  --   --   --   --   --   --   HGB 14.7   < > 14.6 13.6 14.5 16.7 16.0  HCT 48.0   < > 46.4 43.0 43.5 50.1 48.2  MCV 96.4   < > 93.9 92.5 91.4 90.8 90.1  PLT 136*   < > 190 153 142* 166 220   < > = values in this interval not displayed.   Basic Metabolic Panel: Recent Labs  Lab 11/21/18 0220  11/23/18 0328 11/24/18 0314 11/25/18 0741 11/26/18 0355 11/27/18 0340  NA 143   < > 140 140 140 140 138  K 4.5   < > 4.6 4.3 4.1 4.4 4.9  CL 107   < > 108 106 105 100 103  CO2 22   < > 26 26 27 29 25   GLUCOSE 156*   < > 119* 104* 92 89 208*  BUN 62*   < > 31* 21 16 19  24*  CREATININE 2.37*   < > 1.16 0.98 0.96 0.94 1.07  CALCIUM 7.5*   < > 8.1* 8.1* 8.3* 9.0 9.0  MG 2.0  --   --   --   --  1.9  --   PHOS 5.9*  --   --   --   --   --   --    < > = values in this interval not displayed.   GFR: Estimated Creatinine Clearance: 69.4 mL/min (by C-G formula based on SCr of 1.07 mg/dL). Liver Function Tests: Recent Labs  Lab 11/20/18 1927 11/22/18 0210 11/23/18 0328 11/24/18 0314 11/25/18 0741  AST 882* 740* 240* 112* 70*  ALT 883* 1,006* 669* 452* 307*  ALKPHOS 94 103 81 77 75  BILITOT 0.7 0.9 0.5 0.8 0.7  PROT 5.6* 5.7* 5.3* 5.5* 5.4*  ALBUMIN 2.7* 2.7* 2.5* 2.5* 2.6*   Recent Labs  Lab 11/20/18 1927  LIPASE 28   No results for input(s): AMMONIA in the last 168 hours. Coagulation Profile: Recent Labs  Lab 11/25/18 0741  INR 1.13   Cardiac Enzymes: Recent Labs  Lab 11/20/18 1927 11/21/18 0706 11/21/18 1144 11/21/18 1833  TROPONINI 3.14* 2.98* 2.82* 2.40*   BNP (last 3 results) No results for input(s): PROBNP in the last 8760 hours. HbA1C: No results for input(s): HGBA1C in  the last 72 hours. CBG: Recent Labs  Lab 11/26/18 2130 11/27/18 0756 11/27/18 1152 11/27/18 1639 11/27/18 1717  GLUCAP 125* 271* 234* 321* 319*   Lipid Profile: No results for input(s): CHOL, HDL, LDLCALC, TRIG, CHOLHDL, LDLDIRECT in the last 72 hours. Thyroid Function Tests: No results for input(s): TSH, T4TOTAL, FREET4, T3FREE, THYROIDAB in the last 72 hours. Anemia Panel: No results for input(s): VITAMINB12, FOLATE, FERRITIN, TIBC, IRON, RETICCTPCT in the last 72 hours. Urine analysis:    Component Value Date/Time   COLORURINE AMBER (A) 11/21/2018 0211   APPEARANCEUR CLOUDY (A) 11/21/2018 0211   LABSPEC 1.020 11/21/2018 0211   PHURINE 5.0 11/21/2018 0211   GLUCOSEU  NEGATIVE 11/21/2018 0211   HGBUR NEGATIVE 11/21/2018 0211   BILIRUBINUR NEGATIVE 11/21/2018 0211   KETONESUR NEGATIVE 11/21/2018 0211   PROTEINUR 30 (A) 11/21/2018 0211   UROBILINOGEN 0.2 12/19/2014 0821   NITRITE NEGATIVE 11/21/2018 0211   LEUKOCYTESUR NEGATIVE 11/21/2018 0211   Sepsis Labs: @LABRCNTIP (procalcitonin:4,lacticidven:4)  ) Recent Results (from the past 240 hour(s))  Culture, blood (Routine X 2) w Reflex to ID Panel     Status: Abnormal   Collection Time: 11/20/18  7:35 PM  Result Value Ref Range Status   Specimen Description BLOOD RIGHT ANTECUBITAL  Final   Special Requests   Final    BOTTLES DRAWN AEROBIC AND ANAEROBIC Blood Culture results may not be optimal due to an inadequate volume of blood received in culture bottles   Culture  Setup Time   Final    GRAM POSITIVE COCCI IN CHAINS AEROBIC BOTTLE ONLY CRITICAL RESULT CALLED TO, READ BACK BY AND VERIFIED WITH: Asencion Islam PharmD 17:20 11/21/18 ([email or web address])    Culture (A)  Final    VIRIDANS STREPTOCOCCUS THE SIGNIFICANCE OF ISOLATING THIS ORGANISM FROM A SINGLE SET OF BLOOD CULTURES WHEN MULTIPLE SETS ARE DRAWN IS UNCERTAIN. PLEASE NOTIFY THE MICROBIOLOGY DEPARTMENT WITHIN ONE WEEK IF SPECIATION AND SENSITIVITIES ARE REQUIRED. Performed at  [facility], [place] [address], [place], [place] [address]    Report Status 11/23/2018 FINAL  Final  Blood Culture ID Panel (Reflexed)     Status: Abnormal   Collection Time: 11/20/18  7:35 PM  Result Value Ref Range Status   Enterococcus species NOT DETECTED NOT DETECTED Final   Listeria monocytogenes NOT DETECTED NOT DETECTED Final   Staphylococcus species NOT DETECTED NOT DETECTED Final   Staphylococcus aureus (BCID) NOT DETECTED NOT DETECTED Final   Streptococcus species DETECTED (A) NOT DETECTED Final    Comment: Not Enterococcus species, Streptococcus agalactiae, Streptococcus pyogenes, or Streptococcus pneumoniae. CRITICAL RESULT CALLED TO, READ BACK BY AND VERIFIED WITH: Asencion Islam PharmD 17:20 11/21/18 ([email or web address])    Streptococcus agalactiae NOT DETECTED NOT DETECTED Final   Streptococcus pneumoniae NOT DETECTED NOT DETECTED Final   Streptococcus pyogenes NOT DETECTED NOT DETECTED Final   Acinetobacter baumannii NOT DETECTED NOT DETECTED Final   Enterobacteriaceae species NOT DETECTED NOT DETECTED Final   Enterobacter cloacae complex NOT DETECTED NOT DETECTED Final   Escherichia coli NOT DETECTED NOT DETECTED Final   Klebsiella oxytoca NOT DETECTED NOT DETECTED Final   Klebsiella pneumoniae NOT DETECTED NOT DETECTED Final   Proteus species NOT DETECTED NOT DETECTED Final   Serratia marcescens NOT DETECTED NOT DETECTED Final   Haemophilus influenzae NOT DETECTED NOT DETECTED Final   Neisseria meningitidis NOT DETECTED NOT DETECTED Final   Pseudomonas aeruginosa NOT DETECTED NOT DETECTED Final   Candida albicans NOT DETECTED NOT DETECTED Final   Candida glabrata NOT DETECTED NOT DETECTED Final   Candida krusei NOT DETECTED NOT DETECTED Final   Candida parapsilosis NOT DETECTED NOT DETECTED Final   Candida tropicalis NOT DETECTED NOT DETECTED Final    Comment: Performed at [facility], [place] [address], [place], [place] [address]  Culture, blood (Routine X 2) w Reflex  to ID Panel     Status: None   Collection Time: 11/20/18  7:50 PM  Result Value Ref Range Status   Specimen Description BLOOD LEFT HAND  Final   Special Requests   Final    BOTTLES DRAWN AEROBIC AND ANAEROBIC Blood Culture results may not be optimal due to an inadequate volume of blood  received in culture bottles   Culture   Final    NO GROWTH 5 DAYS Performed at [facility], [place] [address] [place], [place] [address]    Report Status 11/25/2018 FINAL  Final  MRSA PCR Screening     Status: None   Collection Time: 11/21/18  1:01 AM  Result Value Ref Range Status   MRSA by PCR NEGATIVE NEGATIVE Final    Comment:        The GeneXpert MRSA Assay (FDA approved for NASAL specimens only), is one component of a comprehensive MRSA colonization surveillance program. It is not intended to diagnose MRSA infection nor to guide or monitor treatment for MRSA infections. Performed at [facility], [place] [address] [place], [place] [address]   Culture, Urine     Status: None   Collection Time: 11/21/18  2:11 AM  Result Value Ref Range Status   Specimen Description URINE, RANDOM  Final   Special Requests NONE  Final   Culture   Final    NO GROWTH Performed at [facility], [place] [address], [place], [place] [address]    Report Status 11/22/2018 FINAL  Final  Culture, blood (routine x 2)     Status: None   Collection Time: 11/21/18  9:41 PM  Result Value Ref Range Status   Specimen Description BLOOD BLOOD RIGHT FOREARM  Final   Special Requests   Final    BOTTLES DRAWN AEROBIC ONLY Blood Culture adequate volume   Culture NO GROWTH 5 DAYS  Final   Report Status 11/26/2018 FINAL  Final  Culture, blood (routine x 2)     Status: None   Collection Time: 11/21/18  9:41 PM  Result Value Ref Range Status   Specimen Description BLOOD BLOOD LEFT FOREARM  Final   Special Requests   Final    BOTTLES DRAWN AEROBIC ONLY Blood Culture adequate volume   Culture NO GROWTH 5 DAYS  Final    Report Status 11/26/2018 FINAL  Final         Radiology Studies: Ct Angio Chest Pe W Or Wo Contrast  Result Date: 11/26/2018 CLINICAL DATA:  Shortness of breath and hypoxia. EXAM: CT ANGIOGRAPHY CHEST WITH CONTRAST TECHNIQUE: Multidetector CT imaging of the chest was performed using the standard protocol during bolus administration of intravenous contrast. Multiplanar CT image reconstructions and MIPs were obtained to evaluate the vascular anatomy. CONTRAST:  12mL ISOVUE-370 IOPAMIDOL (ISOVUE-370) INJECTION 76% COMPARISON:  Current chest radiograph and prior chest radiographs. FINDINGS: Cardiovascular: The pulmonary arteries are well opacified to the segmental level. There is no evidence of a pulmonary embolism. Moderate enlargement of the heart. Trace pericardial effusion. Two vessel coronary artery calcifications. Aorta is normal caliber. There are aortic atherosclerotic calcifications. Aorta is not opacified. Mediastinum/Nodes: No neck base or axillary masses or pathologically enlarged lymph nodes. There are prominent mediastinal lymph nodes, largest a left para carinal node measuring 12 mm short axis. No mediastinal or hilar masses. Trachea and esophagus are unremarkable. Lungs/Pleura: Trace pleural effusions, more on the right. Bronchial wall thickening, most evident in the lower lobes, with secretions occluding several subsegmental bronchi. There is dependent opacity in both lower lobes consistent with atelectasis, greater on the right. Lungs demonstrate advanced changes of centrilobular emphysema. No mass or suspicious nodule. No evidence of pulmonary edema. No pneumothorax. Upper Abdomen: No acute abnormality. Musculoskeletal: No fracture or acute finding. No osteoblastic or osteolytic lesions. Review of the MIP images confirms the above findings. IMPRESSION:  1. No evidence of a pulmonary embolism. 2. Trace pleural effusions. Bronchial wall thickening with several subsegmental lower lobe bronchi  occluded with secretions/mucus. There is dependent lower lobe atelectasis, right greater than left. No convincing pneumonia. 3. Advanced centrilobular emphysema. 4. No convincing pulmonary edema. Aortic Atherosclerosis (ICD10-I70.0) and Emphysema (ICD10-J43.9). Electronically Signed   By: Lajean Manes M.D.   On: 11/26/2018 18:11        Scheduled Meds: . amLODipine  5 mg Oral Daily  . apixaban  5 mg Oral BID  . carvedilol  12.5 mg Oral BID WC  . furosemide  40 mg Intravenous Daily  . guaiFENesin  1,200 mg Oral BID  . [START ON 11/28/2018] insulin aspart  0-15 Units Subcutaneous TID WC  . insulin aspart  0-5 Units Subcutaneous QHS  . ipratropium  0.5 mg Nebulization [id number]  . levalbuterol  0.63 mg Nebulization [id number]  . lisinopril  20 mg Oral Daily  . mouth rinse  15 mL Mouth Rinse BID  . predniSONE  50 mg Oral Q breakfast   Continuous Infusions:    LOS: 7 days    Time spent: 56min    Author:  Berle Mull, MD [facility] 11/27/2018  If 7PM-7AM, please contact night-coverage To reach On-call, see [email or web address]  11/27/2018, 5:26 PM

## 2018-11-28 ENCOUNTER — Telehealth: Payer: Self-pay | Admitting: Cardiology

## 2018-11-28 LAB — BASIC METABOLIC PANEL
Anion gap: 8 (ref 5–15)
BUN: 29 mg/dL — ABNORMAL HIGH (ref 8–23)
CO2: 31 mmol/L (ref 22–32)
Calcium: 9 mg/dL (ref 8.9–10.3)
Chloride: 102 mmol/L (ref 98–111)
Creatinine, Ser: 1.18 mg/dL (ref 0.61–1.24)
GFR calc Af Amer: >60 mL/min (ref >60)
GFR calc non Af Amer: >60 mL/min (ref >60)
GLUCOSE: 144 mg/dL — AB (ref 70–99)
Potassium: 4.8 mmol/L (ref 3.5–5.1)
Sodium: 141 mmol/L (ref 135–145)

## 2018-11-28 LAB — CBC
HCT: 48.7 % (ref 39.0–52.0)
Hemoglobin: 15.8 g/dL (ref 13.0–17.0)
MCH: 29.4 pg (ref 26.0–34.0)
MCHC: 32.4 g/dL (ref 30.0–36.0)
MCV: 90.7 fL (ref 80.0–100.0)
Platelets: 238 10*3/uL (ref 150–400)
RBC: 5.37 MIL/uL (ref 4.22–5.81)
RDW: 16 % — AB (ref 11.5–15.5)
WBC: 17.5 10*3/uL — ABNORMAL HIGH (ref 4.0–10.5)
nRBC: 0 % (ref 0.0–0.2)

## 2018-11-28 LAB — GLUCOSE, CAPILLARY
GLUCOSE-CAPILLARY: 184 mg/dL — AB (ref 70–99)
Glucose-Capillary: 216 mg/dL — ABNORMAL HIGH (ref 70–99)
Glucose-Capillary: 173 mg/dL — ABNORMAL HIGH (ref 70–99)
Glucose-Capillary: 234 mg/dL — ABNORMAL HIGH (ref 70–99)

## 2018-11-28 LAB — MAGNESIUM: Magnesium: 2.2 mg/dL (ref 1.7–2.4)

## 2018-11-28 MED ORDER — LEVALBUTEROL HCL 0.63 MG/3ML IN NEBU
0.6300 mg | INHALATION_SOLUTION | Freq: Three times a day (TID) | RESPIRATORY_TRACT | Status: DC
  Administered 2018-11-29: 0.63 mg via RESPIRATORY_TRACT
  Filled 2018-11-28: qty 3

## 2018-11-28 MED ORDER — IPRATROPIUM BROMIDE 0.02 % IN SOLN
0.5000 mg | Freq: Three times a day (TID) | RESPIRATORY_TRACT | Status: DC
  Administered 2018-11-29: 0.5 mg via RESPIRATORY_TRACT
  Filled 2018-11-28: qty 2.5

## 2018-11-28 NOTE — Telephone Encounter (Signed)
Per DPR, patient's wife. Patient would like to know what is going on with her husband at the hospital. She is sick at home, and is just curious as to what is going on with her husband because he is unsure himself. Advised of his current diagnosis, and his current lab work. Patient's wife was thankful for the call, and had no other questions at this time.

## 2018-11-28 NOTE — Telephone Encounter (Signed)
New message   [name] is calling because PT is in hospital and she has questions for his Cardiologist that the hospital is not helping her with  Please call

## 2018-11-28 NOTE — Progress Notes (Signed)
PROGRESS NOTE    Kyle [name]  HYI:[id number] DOB: [birth year] DOA: 11/20/2018 PCP: Alonna Buckler, MD  Brief Narrative: 68 year old male with history of nonischemic cardiomyopathy, moderate pulmonary hypertension, COPD, PAD status post femoropopliteal bypass and aortobifemoral bypass in 2016 was admitted to the ICU on 1/26 with hypotension, malaise, nausea vomiting cough congestion night sweats chills and syncope the day of admission. -Treated with antibiotics, fluid resuscitation and pressors -Also being followed by cardiology for atrial fibrillation and cardiomyopathy -Transferred from PCCM to [facility] service 1/28  Subjective: More fatigued this morning.  No nausea no vomiting.  He is religiously using incentive spirometry as well as flutter valve.  I do not see chest vest in this room.  No nausea no vomiting.  Assessment & Plan:   Shock -Resolved, per PCCM probably hypovolemic shock due to nausea vomiting decreased oral intake -Treated with fluid resuscitation and pressors in ICU -Improved -Blood cultures on admission 1/26, 1/2 bottles with strep viridans -Did not have fevers or leukocytosis on admission, did have some cough and congestion, discussed with ID Dr.Comer could have been transient bacteremia from oral source, repeat cultures are negative, afebrile nontoxic, recommended stopping Abx  -2D echocardiogram unremarkable as well -will discontinue ceftriaxone and monitor  Acute hypoxic respiratory failure. Recently treated COPD exacerbation -Worsening -Antibiotics as above, add duo nebs, steroid taper Severely hypoxic right now. While the patient has completed his antibiotic treatment remains severely hypoxic and therefore CT scan of the PE was performed to rule out any pulmonary embolism which has been negative for PE. Show significant mucus plugging. Will use nebulizer as well as prednisone Mucinex and chest PT. If no improvement tomorrow will require CCM consultation for  further assistance.  Acute kidney injury -Likely due to hypoperfusion/hypotension, creatinine improving was 2.7 on admission, down to 1.9 now -off IVF now, creatinine has normalized  Type 2 diabetes mellitus with uncontrolled hyperglycemia without any complication. Blood sugars are currently elevated secondary to steroids. Continue sliding scale insulin. We will monitor. On metformin at home.  As well as Lantus 15 units.  Transaminitis -Probably shock liver, LFTs improving, he was hypotensive on admission  Acute on chronic combined systolic and diastolic CHF. Pulmonary hypertension. -ECHO with EF of 40 to 45%, mild left ear and moderate AR, unchanged from prior -No coronary disease disease based on prior cath -appears slightly vol overloaded, treated with IV Lasix.   Currently holding diuresis due to mild increase in serum creatinine.  Paroxysmal atrial fibrillation -Back in sinus rhythm now, carvedilol on hold with hypotension, -restarted coreg, and newly started on Apixaban  History of bicuspid aortic valve, mild AS, moderate AR -Outpatient follow-up  hypertension with urgency. Blood pressure remained stable elevated. Resume Norvasc, Coreg dose adjusted We will increase lisinopril dose.  Monitor.  DVT prophylaxis: Lovenox Code Status: Full code Family Communication: No family at bedside Disposition Plan: Home pending clinical improvement in respiratory status, bacteremia etc.  Consultants:   Cardiology  PCCM   Procedures:   Antimicrobials:   Objective: Vitals:   11/28/18 1402 11/28/18 1600 11/28/18 1700 11/28/18 1735  BP:  (!) 154/67  (!) 154/67  Pulse:  (!) 56 (!) 51 63  Resp:  18 19 17   Temp:    97.9 F (36.6 C)  TempSrc:    Axillary  SpO2: 92% 96% 93% 90%  Weight:      Height:        Intake/Output Summary (Last 24 hours) at 11/28/2018 1920 Last data filed at 11/28/2018  1814 Gross per 24 hour  Intake 720 ml  Output 1025 ml  Net -305 ml   Filed  Weights   11/21/18 2122 11/22/18 0500 11/24/18 0411  Weight: 79.1 kg 79.1 kg 72.2 kg    Examination:  Gen: Awake, Alert, Oriented X 3, mild distress today HEENT: PERRLA, Neck supple, no JVD Lungs: Improving air movement, no expiratory wheezes, decreased breath sounds at both bases bilateral basal crackles. CVS: RRR,No Gallops,Rubs or new Murmurs Abd: soft, Non tender, non distended, BS present Extremities: No edema Skin: no new rashes Psychiatry: Judgement and insight appear normal. Mood & affect appropriate.     Data Reviewed:   CBC: Recent Labs  Lab 11/23/18 0328 11/24/18 0314 11/26/18 0355 11/27/18 0340 11/28/18 0336  WBC 5.1 5.6 8.8 7.7 17.5*  HGB 13.6 14.5 16.7 16.0 15.8  HCT 43.0 43.5 50.1 48.2 48.7  MCV 92.5 91.4 90.8 90.1 90.7  PLT 153 142* 166 220 637   Basic Metabolic Panel: Recent Labs  Lab 11/24/18 0314 11/25/18 0741 11/26/18 0355 11/27/18 0340 11/28/18 0336  NA 140 140 140 138 141  K 4.3 4.1 4.4 4.9 4.8  CL 106 105 100 103 102  CO2 26 27 29 25 31   GLUCOSE 104* 92 89 208* 144*  BUN 21 16 19  24* 29*  CREATININE 0.98 0.96 0.94 1.07 1.18  CALCIUM 8.1* 8.3* 9.0 9.0 9.0  MG  --   --  1.9  --  2.2   GFR: Estimated Creatinine Clearance: 62.9 mL/min (by C-G formula based on SCr of 1.18 mg/dL). Liver Function Tests: Recent Labs  Lab 11/22/18 0210 11/23/18 0328 11/24/18 0314 11/25/18 0741  AST 740* 240* 112* 70*  ALT 1,006* 669* 452* 307*  ALKPHOS 103 81 77 75  BILITOT 0.9 0.5 0.8 0.7  PROT 5.7* 5.3* 5.5* 5.4*  ALBUMIN 2.7* 2.5* 2.5* 2.6*   No results for input(s): LIPASE, AMYLASE in the last 168 hours. No results for input(s): AMMONIA in the last 168 hours. Coagulation Profile: Recent Labs  Lab 11/25/18 0741  INR 1.13   Cardiac Enzymes: No results for input(s): CKTOTAL, CKMB, CKMBINDEX, TROPONINI in the last 168 hours. BNP (last 3 results) No results for input(s): PROBNP in the last 8760 hours. HbA1C: No results for input(s): HGBA1C  in the last 72 hours. CBG: Recent Labs  Lab 11/27/18 1931 11/27/18 2101 11/28/18 0933 11/28/18 1127 11/28/18 1737  GLUCAP 275* 240* 216* 173* 184*   Lipid Profile: No results for input(s): CHOL, HDL, LDLCALC, TRIG, CHOLHDL, LDLDIRECT in the last 72 hours. Thyroid Function Tests: No results for input(s): TSH, T4TOTAL, FREET4, T3FREE, THYROIDAB in the last 72 hours. Anemia Panel: No results for input(s): VITAMINB12, FOLATE, FERRITIN, TIBC, IRON, RETICCTPCT in the last 72 hours. Urine analysis:    Component Value Date/Time   COLORURINE AMBER (A) 11/21/2018 0211   APPEARANCEUR CLOUDY (A) 11/21/2018 0211   LABSPEC 1.020 11/21/2018 0211   PHURINE 5.0 11/21/2018 0211   GLUCOSEU NEGATIVE 11/21/2018 0211   HGBUR NEGATIVE 11/21/2018 0211   BILIRUBINUR NEGATIVE 11/21/2018 0211   KETONESUR NEGATIVE 11/21/2018 0211   PROTEINUR 30 (A) 11/21/2018 0211   UROBILINOGEN 0.2 12/19/2014 0821   NITRITE NEGATIVE 11/21/2018 0211   LEUKOCYTESUR NEGATIVE 11/21/2018 0211   Recent Results (from the past 240 hour(s))  Culture, blood (Routine X 2) w Reflex to ID Panel     Status: Abnormal   Collection Time: 11/20/18  7:35 PM  Result Value Ref Range Status   Specimen Description BLOOD RIGHT  ANTECUBITAL  Final   Special Requests   Final    BOTTLES DRAWN AEROBIC AND ANAEROBIC Blood Culture results may not be optimal due to an inadequate volume of blood received in culture bottles   Culture  Setup Time   Final    GRAM POSITIVE COCCI IN CHAINS AEROBIC BOTTLE ONLY CRITICAL RESULT CALLED TO, READ BACK BY AND VERIFIED WITH: Asencion Islam PharmD 17:20 11/21/18 ([email or web address])    Culture (A)  Final    VIRIDANS STREPTOCOCCUS THE SIGNIFICANCE OF ISOLATING THIS ORGANISM FROM A SINGLE SET OF BLOOD CULTURES WHEN MULTIPLE SETS ARE DRAWN IS UNCERTAIN. PLEASE NOTIFY THE MICROBIOLOGY DEPARTMENT WITHIN ONE WEEK IF SPECIATION AND SENSITIVITIES ARE REQUIRED. Performed at [facility], [place] [address], [place], [place]  [address]    Report Status 11/23/2018 FINAL  Final  Blood Culture ID Panel (Reflexed)     Status: Abnormal   Collection Time: 11/20/18  7:35 PM  Result Value Ref Range Status   Enterococcus species NOT DETECTED NOT DETECTED Final   Listeria monocytogenes NOT DETECTED NOT DETECTED Final   Staphylococcus species NOT DETECTED NOT DETECTED Final   Staphylococcus aureus (BCID) NOT DETECTED NOT DETECTED Final   Streptococcus species DETECTED (A) NOT DETECTED Final    Comment: Not Enterococcus species, Streptococcus agalactiae, Streptococcus pyogenes, or Streptococcus pneumoniae. CRITICAL RESULT CALLED TO, READ BACK BY AND VERIFIED WITH: Asencion Islam PharmD 17:20 11/21/18 ([email or web address])    Streptococcus agalactiae NOT DETECTED NOT DETECTED Final   Streptococcus pneumoniae NOT DETECTED NOT DETECTED Final   Streptococcus pyogenes NOT DETECTED NOT DETECTED Final   Acinetobacter baumannii NOT DETECTED NOT DETECTED Final   Enterobacteriaceae species NOT DETECTED NOT DETECTED Final   Enterobacter cloacae complex NOT DETECTED NOT DETECTED Final   Escherichia coli NOT DETECTED NOT DETECTED Final   Klebsiella oxytoca NOT DETECTED NOT DETECTED Final   Klebsiella pneumoniae NOT DETECTED NOT DETECTED Final   Proteus species NOT DETECTED NOT DETECTED Final   Serratia marcescens NOT DETECTED NOT DETECTED Final   Haemophilus influenzae NOT DETECTED NOT DETECTED Final   Neisseria meningitidis NOT DETECTED NOT DETECTED Final   Pseudomonas aeruginosa NOT DETECTED NOT DETECTED Final   Candida albicans NOT DETECTED NOT DETECTED Final   Candida glabrata NOT DETECTED NOT DETECTED Final   Candida krusei NOT DETECTED NOT DETECTED Final   Candida parapsilosis NOT DETECTED NOT DETECTED Final   Candida tropicalis NOT DETECTED NOT DETECTED Final    Comment: Performed at [facility], [place] [address], [place], [place] [address]  Culture, blood (Routine X 2) w Reflex to ID Panel     Status: None   Collection Time:  11/20/18  7:50 PM  Result Value Ref Range Status   Specimen Description BLOOD LEFT HAND  Final   Special Requests   Final    BOTTLES DRAWN AEROBIC AND ANAEROBIC Blood Culture results may not be optimal due to an inadequate volume of blood received in culture bottles   Culture   Final    NO GROWTH 5 DAYS Performed at [facility], [place] [address], [place],  [address]    Report Status 11/25/2018 FINAL  Final  MRSA PCR Screening     Status: None   Collection Time: 11/21/18  1:01 AM  Result Value Ref Range Status   MRSA by PCR NEGATIVE NEGATIVE Final    Comment:        The GeneXpert MRSA Assay (FDA approved for NASAL specimens only), is one component of a  comprehensive MRSA colonization surveillance program. It is not intended to diagnose MRSA infection nor to guide or monitor treatment for MRSA infections. Performed at [facility], [place] [address], [place], [place] [address]   Culture, Urine     Status: None   Collection Time: 11/21/18  2:11 AM  Result Value Ref Range Status   Specimen Description URINE, RANDOM  Final   Special Requests NONE  Final   Culture   Final    NO GROWTH Performed at [facility], [place] [address] [place], [place] [address]    Report Status 11/22/2018 FINAL  Final  Culture, blood (routine x 2)     Status: None   Collection Time: 11/21/18  9:41 PM  Result Value Ref Range Status   Specimen Description BLOOD BLOOD RIGHT FOREARM  Final   Special Requests   Final    BOTTLES DRAWN AEROBIC ONLY Blood Culture adequate volume   Culture NO GROWTH 5 DAYS  Final   Report Status 11/26/2018 FINAL  Final  Culture, blood (routine x 2)     Status: None   Collection Time: 11/21/18  9:41 PM  Result Value Ref Range Status   Specimen Description BLOOD BLOOD LEFT FOREARM  Final   Special Requests   Final    BOTTLES DRAWN AEROBIC ONLY Blood Culture adequate volume   Culture NO GROWTH 5 DAYS  Final   Report Status 11/26/2018 FINAL  Final          Radiology Studies: No results found.      Scheduled Meds: . amLODipine  5 mg Oral Daily  . apixaban  5 mg Oral BID  . carvedilol  12.5 mg Oral BID WC  . guaiFENesin  1,200 mg Oral BID  . insulin aspart  0-15 Units Subcutaneous TID WC  . insulin aspart  0-5 Units Subcutaneous QHS  . ipratropium  0.5 mg Nebulization [id number]  . levalbuterol  0.63 mg Nebulization [id number]  . lisinopril  20 mg Oral Daily  . mouth rinse  15 mL Mouth Rinse BID  . predniSONE  50 mg Oral Q breakfast   Continuous Infusions:    LOS: 8 days    Time spent: 27min    Author:  Berle Mull, MD [facility] 11/28/2018  If 7PM-7AM, please contact night-coverage To reach On-call, see [email or web address]  11/28/2018, 7:20 PM

## 2018-11-29 LAB — GLUCOSE, CAPILLARY
GLUCOSE-CAPILLARY: 235 mg/dL — AB (ref 70–99)
Glucose-Capillary: 127 mg/dL — ABNORMAL HIGH (ref 70–99)
Glucose-Capillary: 231 mg/dL — ABNORMAL HIGH (ref 70–99)
Glucose-Capillary: 236 mg/dL — ABNORMAL HIGH (ref 70–99)

## 2018-11-29 LAB — CBC
HCT: 48.6 % (ref 39.0–52.0)
Hemoglobin: 15.8 g/dL (ref 13.0–17.0)
MCH: 29.3 pg (ref 26.0–34.0)
MCHC: 32.5 g/dL (ref 30.0–36.0)
MCV: 90 fL (ref 80.0–100.0)
Platelets: 246 10*3/uL (ref 150–400)
RBC: 5.4 MIL/uL (ref 4.22–5.81)
RDW: 15.9 % — ABNORMAL HIGH (ref 11.5–15.5)
WBC: 16.2 10*3/uL — AB (ref 4.0–10.5)
nRBC: 0 % (ref 0.0–0.2)

## 2018-11-29 LAB — BASIC METABOLIC PANEL
Anion gap: 9 (ref 5–15)
BUN: 31 mg/dL — ABNORMAL HIGH (ref 8–23)
CALCIUM: 9.2 mg/dL (ref 8.9–10.3)
CO2: 29 mmol/L (ref 22–32)
Chloride: 100 mmol/L (ref 98–111)
Creatinine, Ser: 1.21 mg/dL (ref 0.61–1.24)
GFR calc Af Amer: >60 mL/min (ref >60)
Glucose, Bld: 145 mg/dL — ABNORMAL HIGH (ref 70–99)
Potassium: 5 mmol/L (ref 3.5–5.1)
Sodium: 138 mmol/L (ref 135–145)

## 2018-11-29 NOTE — Care Management (Signed)
#    1.   S/ W     SUE  @   [facility]  [facility] RX # [id number]   APIXABAN:  NONE FORMULARY   ELIQUIS   5 MG BID COVER- YES CO-PAY- $ 45.00 TIER- 3 DRUG PRIOR APPROVAL- NO  PREFERRED PHARMACY :  YES  - [facility]

## 2018-11-29 NOTE — Progress Notes (Signed)
PROGRESS NOTE    BRONCO MCGRORY  URK:[id number] DOB: 02/02/1951 DOA: 11/20/2018 PCP: Alonna Buckler, MD  Brief Narrative: 68 year old male with history of nonischemic cardiomyopathy, moderate pulmonary hypertension, COPD, PAD status post femoropopliteal bypass and aortobifemoral bypass in 2016 was admitted to the ICU on 1/26 with hypotension, malaise, nausea vomiting cough congestion night sweats chills and syncope the day of admission. -Treated with antibiotics, fluid resuscitation and pressors -Also being followed by cardiology for atrial fibrillation and cardiomyopathy -Transferred from PCCM to [facility] service 1/28  Subjective: Continues to have shortness of breath no nausea no vomiting no fever no chills.  Somewhat improvement in breathing.  Significant improvement in oxygenation.  Assessment & Plan:   Shock -Resolved, per PCCM probably hypovolemic shock due to nausea vomiting decreased oral intake -Treated with fluid resuscitation and pressors in ICU -Improved -Blood cultures on admission 1/26, 1/2 bottles with strep viridans -Did not have fevers or leukocytosis on admission, did have some cough and congestion, discussed with ID Dr.Comer could have been transient bacteremia from oral source, repeat cultures are negative, afebrile nontoxic, recommended stopping Abx  -2D echocardiogram unremarkable as well -will discontinue ceftriaxone and monitor  Acute hypoxic respiratory failure. Recently treated COPD exacerbation -Now better with chest PT with vest -Antibiotics as above, add duo nebs, steroid taper Severely hypoxic right now. While the patient has completed his antibiotic treatment remains severely hypoxic and therefore CT scan of the PE was performed to rule out any pulmonary embolism which has been negative for PE. Show significant mucus plugging. Will use nebulizer as well as prednisone Mucinex and chest PT. No chest vest today, likely can transition to flutter valve  tomorrow and go home with oxygen.  Acute kidney injury -Likely due to hypoperfusion/hypotension, creatinine improving was 2.7 on admission, down to 1.9 now -off IVF now, creatinine has normalized  Type 2 diabetes mellitus with uncontrolled hyperglycemia without any complication. Blood sugars are currently elevated secondary to steroids. Continue sliding scale insulin. We will monitor. On metformin at home.  As well as Lantus 15 units.  Transaminitis -Probably shock liver, LFTs improving, he was hypotensive on admission  Acute on chronic combined systolic and diastolic CHF. Pulmonary hypertension. -ECHO with EF of 40 to 45%, mild left ear and moderate AR, unchanged from prior -No coronary disease disease based on prior cath -appears slightly vol overloaded, treated with IV Lasix.   Currently holding diuresis due to mild increase in serum creatinine.  Paroxysmal atrial fibrillation -Back in sinus rhythm now, carvedilol on hold with hypotension, -restarted coreg, and newly started on Apixaban  History of bicuspid aortic valve, mild AS, moderate AR -Outpatient follow-up  hypertension with urgency. Blood pressure remained stable elevated. Resume Norvasc, Coreg dose adjusted We will increase lisinopril dose.  Monitor.  DVT prophylaxis: Lovenox Code Status: Full code Family Communication: Wife family at bedside Disposition Plan: Home pending clinical improvement in respiratory status, bacteremia etc.  Consultants:   Cardiology  PCCM   Procedures:   Antimicrobials:   Objective: Vitals:   11/29/18 1000 11/29/18 1158 11/29/18 1200 11/29/18 1709  BP: (!) 164/69 (!) 149/74 (!) 150/76 (!) 152/84  Pulse: 60 60 61 77  Resp:  (!) 22 19 (!) 22  Temp:  97.9 F (36.6 C)  98.2 F (36.8 C)  TempSrc:  Oral  Oral  SpO2:  92% 91% 90%  Weight:      Height:        Intake/Output Summary (Last 24 hours) at 11/29/2018 1719 Last  data filed at 11/29/2018 1228 Gross per 24 hour    Intake 833 ml  Output 750 ml  Net 83 ml   Filed Weights   11/22/18 0500 11/24/18 0411 11/29/18 0546  Weight: 79.1 kg 72.2 kg 68.2 kg    Examination:  Gen: Awake, Alert, Oriented X 3, mild distress today HEENT: PERRLA, Neck supple, no JVD Lungs: Improving air movement, no expiratory wheezes, decreased breath sounds at both bases bilateral basal crackles. CVS: RRR,No Gallops,Rubs or new Murmurs Abd: soft, Non tender, non distended, BS present Extremities: No edema Skin: no new rashes Psychiatry: Judgement and insight appear normal. Mood & affect appropriate.     Data Reviewed:   CBC: Recent Labs  Lab 11/24/18 0314 11/26/18 0355 11/27/18 0340 11/28/18 0336 11/29/18 0309  WBC 5.6 8.8 7.7 17.5* 16.2*  HGB 14.5 16.7 16.0 15.8 15.8  HCT 43.5 50.1 48.2 48.7 48.6  MCV 91.4 90.8 90.1 90.7 90.0  PLT 142* 166 220 238 623   Basic Metabolic Panel: Recent Labs  Lab 11/25/18 0741 11/26/18 0355 11/27/18 0340 11/28/18 0336 11/29/18 0309  NA 140 140 138 141 138  K 4.1 4.4 4.9 4.8 5.0  CL 105 100 103 102 100  CO2 27 29 25 31 29   GLUCOSE 92 89 208* 144* 145*  BUN 16 19 24* 29* 31*  CREATININE 0.96 0.94 1.07 1.18 1.21  CALCIUM 8.3* 9.0 9.0 9.0 9.2  MG  --  1.9  --  2.2  --    GFR: Estimated Creatinine Clearance: 57.9 mL/min (by C-G formula based on SCr of 1.21 mg/dL). Liver Function Tests: Recent Labs  Lab 11/23/18 0328 11/24/18 0314 11/25/18 0741  AST 240* 112* 70*  ALT 669* 452* 307*  ALKPHOS 81 77 75  BILITOT 0.5 0.8 0.7  PROT 5.3* 5.5* 5.4*  ALBUMIN 2.5* 2.5* 2.6*   No results for input(s): LIPASE, AMYLASE in the last 168 hours. No results for input(s): AMMONIA in the last 168 hours. Coagulation Profile: Recent Labs  Lab 11/25/18 0741  INR 1.13   Cardiac Enzymes: No results for input(s): CKTOTAL, CKMB, CKMBINDEX, TROPONINI in the last 168 hours. BNP (last 3 results) No results for input(s): PROBNP in the last 8760 hours. HbA1C: No results for  input(s): HGBA1C in the last 72 hours. CBG: Recent Labs  Lab 11/28/18 1737 11/28/18 2144 11/29/18 0826 11/29/18 1157 11/29/18 1708  GLUCAP 184* 234* 127* 235* 231*   Lipid Profile: No results for input(s): CHOL, HDL, LDLCALC, TRIG, CHOLHDL, LDLDIRECT in the last 72 hours. Thyroid Function Tests: No results for input(s): TSH, T4TOTAL, FREET4, T3FREE, THYROIDAB in the last 72 hours. Anemia Panel: No results for input(s): VITAMINB12, FOLATE, FERRITIN, TIBC, IRON, RETICCTPCT in the last 72 hours. Urine analysis:    Component Value Date/Time   COLORURINE AMBER (A) 11/21/2018 0211   APPEARANCEUR CLOUDY (A) 11/21/2018 0211   LABSPEC 1.020 11/21/2018 0211   PHURINE 5.0 11/21/2018 0211   GLUCOSEU NEGATIVE 11/21/2018 0211   HGBUR NEGATIVE 11/21/2018 0211   BILIRUBINUR NEGATIVE 11/21/2018 0211   KETONESUR NEGATIVE 11/21/2018 0211   PROTEINUR 30 (A) 11/21/2018 0211   UROBILINOGEN 0.2 12/19/2014 0821   NITRITE NEGATIVE 11/21/2018 0211   LEUKOCYTESUR NEGATIVE 11/21/2018 0211   Recent Results (from the past 240 hour(s))  Culture, blood (Routine X 2) w Reflex to ID Panel     Status: Abnormal   Collection Time: 11/20/18  7:35 PM  Result Value Ref Range Status   Specimen Description BLOOD RIGHT ANTECUBITAL  Final  Special Requests   Final    BOTTLES DRAWN AEROBIC AND ANAEROBIC Blood Culture results may not be optimal due to an inadequate volume of blood received in culture bottles   Culture  Setup Time   Final    GRAM POSITIVE COCCI IN CHAINS AEROBIC BOTTLE ONLY CRITICAL RESULT CALLED TO, READ BACK BY AND VERIFIED WITH: Asencion Islam PharmD 17:20 11/21/18 ([email or web address])    Culture (A)  Final    VIRIDANS STREPTOCOCCUS THE SIGNIFICANCE OF ISOLATING THIS ORGANISM FROM A SINGLE SET OF BLOOD CULTURES WHEN MULTIPLE SETS ARE DRAWN IS UNCERTAIN. PLEASE NOTIFY THE MICROBIOLOGY DEPARTMENT WITHIN ONE WEEK IF SPECIATION AND SENSITIVITIES ARE REQUIRED. Performed at [facility], [place] [address]  [place], [place] [address]    Report Status 11/23/2018 FINAL  Final  Blood Culture ID Panel (Reflexed)     Status: Abnormal   Collection Time: 11/20/18  7:35 PM  Result Value Ref Range Status   Enterococcus species NOT DETECTED NOT DETECTED Final   Listeria monocytogenes NOT DETECTED NOT DETECTED Final   Staphylococcus species NOT DETECTED NOT DETECTED Final   Staphylococcus aureus (BCID) NOT DETECTED NOT DETECTED Final   Streptococcus species DETECTED (A) NOT DETECTED Final    Comment: Not Enterococcus species, Streptococcus agalactiae, Streptococcus pyogenes, or Streptococcus pneumoniae. CRITICAL RESULT CALLED TO, READ BACK BY AND VERIFIED WITH: Asencion Islam PharmD 17:20 11/21/18 ([email or web address])    Streptococcus agalactiae NOT DETECTED NOT DETECTED Final   Streptococcus pneumoniae NOT DETECTED NOT DETECTED Final   Streptococcus pyogenes NOT DETECTED NOT DETECTED Final   Acinetobacter baumannii NOT DETECTED NOT DETECTED Final   Enterobacteriaceae species NOT DETECTED NOT DETECTED Final   Enterobacter cloacae complex NOT DETECTED NOT DETECTED Final   Escherichia coli NOT DETECTED NOT DETECTED Final   Klebsiella oxytoca NOT DETECTED NOT DETECTED Final   Klebsiella pneumoniae NOT DETECTED NOT DETECTED Final   Proteus species NOT DETECTED NOT DETECTED Final   Serratia marcescens NOT DETECTED NOT DETECTED Final   Haemophilus influenzae NOT DETECTED NOT DETECTED Final   Neisseria meningitidis NOT DETECTED NOT DETECTED Final   Pseudomonas aeruginosa NOT DETECTED NOT DETECTED Final   Candida albicans NOT DETECTED NOT DETECTED Final   Candida glabrata NOT DETECTED NOT DETECTED Final   Candida krusei NOT DETECTED NOT DETECTED Final   Candida parapsilosis NOT DETECTED NOT DETECTED Final   Candida tropicalis NOT DETECTED NOT DETECTED Final    Comment: Performed at [facility], [place] [address], [place], [place] [address]  Culture, blood (Routine X 2) w Reflex to ID Panel     Status: None    Collection Time: 11/20/18  7:50 PM  Result Value Ref Range Status   Specimen Description BLOOD LEFT HAND  Final   Special Requests   Final    BOTTLES DRAWN AEROBIC AND ANAEROBIC Blood Culture results may not be optimal due to an inadequate volume of blood received in culture bottles   Culture   Final    NO GROWTH 5 DAYS Performed at [facility], [place] [address] [place], [place] [address]    Report Status 11/25/2018 FINAL  Final  MRSA PCR Screening     Status: None   Collection Time: 11/21/18  1:01 AM  Result Value Ref Range Status   MRSA by PCR NEGATIVE NEGATIVE Final    Comment:        The GeneXpert MRSA Assay (FDA approved for NASAL specimens only), is one component of a comprehensive MRSA colonization surveillance program.  It is not intended to diagnose MRSA infection nor to guide or monitor treatment for MRSA infections. Performed at [facility], [place] [address] [place], [place] [address]   Culture, Urine     Status: None   Collection Time: 11/21/18  2:11 AM  Result Value Ref Range Status   Specimen Description URINE, RANDOM  Final   Special Requests NONE  Final   Culture   Final    NO GROWTH Performed at [facility], [place] [name] [place], [place] [address]    Report Status 11/22/2018 FINAL  Final  Culture, blood (routine x 2)     Status: None   Collection Time: 11/21/18  9:41 PM  Result Value Ref Range Status   Specimen Description BLOOD BLOOD RIGHT FOREARM  Final   Special Requests   Final    BOTTLES DRAWN AEROBIC ONLY Blood Culture adequate volume   Culture NO GROWTH 5 DAYS  Final   Report Status 11/26/2018 FINAL  Final  Culture, blood (routine x 2)     Status: None   Collection Time: 11/21/18  9:41 PM  Result Value Ref Range Status   Specimen Description BLOOD BLOOD LEFT FOREARM  Final   Special Requests   Final    BOTTLES DRAWN AEROBIC ONLY Blood Culture adequate volume   Culture NO GROWTH 5 DAYS  Final   Report Status 11/26/2018  FINAL  Final         Radiology Studies: No results found.      Scheduled Meds: . amLODipine  5 mg Oral Daily  . apixaban  5 mg Oral BID  . carvedilol  12.5 mg Oral BID WC  . guaiFENesin  1,200 mg Oral BID  . insulin aspart  0-15 Units Subcutaneous TID WC  . insulin aspart  0-5 Units Subcutaneous QHS  . lisinopril  20 mg Oral Daily  . mouth rinse  15 mL Mouth Rinse BID  . predniSONE  50 mg Oral Q breakfast   Continuous Infusions:    LOS: 9 days    Time spent: 70min    Author:  Berle Mull, MD [facility] 11/29/2018  If 7PM-7AM, please contact night-coverage To reach On-call, see [email or web address]  11/29/2018, 5:19 PM

## 2018-11-29 NOTE — Care Management Important Message (Signed)
Important Message  Patient Details  Name: Kyle [name] MRN: [id number] Date of Birth: Mar 30, 1951   Medicare Important Message Given:  Yes    [name] 11/29/2018, 3:44 PM

## 2018-11-30 LAB — CBC
HCT: 48 % (ref 39.0–52.0)
Hemoglobin: 15.5 g/dL (ref 13.0–17.0)
MCH: 29.2 pg (ref 26.0–34.0)
MCHC: 32.3 g/dL (ref 30.0–36.0)
MCV: 90.4 fL (ref 80.0–100.0)
Platelets: 234 10*3/uL (ref 150–400)
RBC: 5.31 MIL/uL (ref 4.22–5.81)
RDW: 15.9 % — ABNORMAL HIGH (ref 11.5–15.5)
WBC: 13.9 10*3/uL — ABNORMAL HIGH (ref 4.0–10.5)
nRBC: 0 % (ref 0.0–0.2)

## 2018-11-30 LAB — BASIC METABOLIC PANEL
Anion gap: 10 (ref 5–15)
BUN: 21 mg/dL (ref 8–23)
CO2: 27 mmol/L (ref 22–32)
CREATININE: 1.03 mg/dL (ref 0.61–1.24)
Calcium: 8.9 mg/dL (ref 8.9–10.3)
Chloride: 102 mmol/L (ref 98–111)
GFR calc Af Amer: >60 mL/min (ref >60)
GFR calc non Af Amer: >60 mL/min (ref >60)
Glucose, Bld: 197 mg/dL — ABNORMAL HIGH (ref 70–99)
Potassium: 4.4 mmol/L (ref 3.5–5.1)
Sodium: 139 mmol/L (ref 135–145)

## 2018-11-30 LAB — GLUCOSE, CAPILLARY
Glucose-Capillary: 109 mg/dL — ABNORMAL HIGH (ref 70–99)
Glucose-Capillary: 191 mg/dL — ABNORMAL HIGH (ref 70–99)

## 2018-11-30 MED ORDER — GUAIFENESIN ER 600 MG PO TB12: 1200.0000 mg | ORAL_TABLET | Freq: Two times a day (BID) | ORAL | 0 refills | Status: DC

## 2018-11-30 MED ORDER — PREDNISONE 10 MG PO TABS: ORAL_TABLET | ORAL | 0 refills | Status: DC

## 2018-11-30 MED ORDER — APIXABAN 5 MG PO TABS: 5.0000 mg | ORAL_TABLET | Freq: Two times a day (BID) | ORAL | 0 refills | Status: DC

## 2018-11-30 NOTE — Care Management Note (Signed)
Case Management Note  Patient Details  Name: Kyle [name] MRN: [id number] Date of Birth: May 23, 1951  Subjective/Objective:   Patient for dc today, he is on 4 liters will need poc ,so he can go to work, [facility] poc only goes to 2 liters, NCM contacted [facility] and their poc goes to 5 liters. They will bring patient a tank to hospital and then get him set up at home with the poc and the reg concentrator.                  Action/Plan: DC when ready.  Expected Discharge Date:  11/30/18               Expected Discharge Plan:  Home/Self Care  In-House Referral:     Discharge planning Services  CM Consult  Post Acute Care Choice:  Durable Medical Equipment Choice offered to:  Patient  DME Arranged:  Oxygen DME Agency:  [facility]  HH Arranged:    [place] Agency:     Status of Service:  Completed, signed off  If discussed at [place] of Stay Meetings, dates discussed:    Additional Comments:  Zenon Mayo, RN 11/30/2018, 2:16 PM

## 2018-11-30 NOTE — Progress Notes (Signed)
SATURATION QUALIFICATIONS: (This note is used to comply with regulatory documentation for home oxygen)  Patient Saturations on Room Air at Rest =90%  Patient Saturations on Room Air while Ambulating=76 %  Patient Saturations on 4 Liters of oxygen while Ambulating = 94-96%  Please briefly explain why patient needs home oxygen:

## 2018-11-30 NOTE — Progress Notes (Signed)
Discharge instructions read and given to patient. Pt verbalize understanding. Belongings packed. IV and telemetry removed. Wife here to pick up pt. Pt wheeled out via wheelchair by NT.

## 2018-11-30 NOTE — Progress Notes (Signed)
Inpatient Diabetes Program Recommendations  AACE/ADA: New Consensus Statement on Inpatient Glycemic Control (2015)  Target Ranges:  Prepandial:   less than 140 mg/dL      Peak postprandial:   less than 180 mg/dL (1-2 hours)      Critically ill patients:  140 - 180 mg/dL   Results for Kyle [name], Kyle [name] (MRN [id number]) as of 11/30/2018 09:40  Ref. Range 11/29/2018 08:26 11/29/2018 11:57 11/29/2018 17:08 11/29/2018 21:28  Glucose-Capillary Latest Ref Range: 70 - 99 mg/dL 127 (H)  2 units NOVOLOG  235 (H)  5 units NOVOLOG  231 (H)  5 units NOVOLOG  236 (H)  2 units NOVOLOG    Results for Kyle [name], Kyle [name] (MRN [id number]) as of 11/30/2018 09:40  Ref. Range 11/30/2018 07:18  Glucose-Capillary Latest Ref Range: 70 - 99 mg/dL 109 (H)     Home DM Meds: Lantus 15 units QHS       Metformin 500 mg BID  Current Orders: Novolog Moderate Correction Scale/ SSI (0-15 units) TID AC + HS      Getting Prednisone 50 mg Daily.  Having afternoon glucose elevations likely from the Prednisone.    MD- Please consider adding Novolog Meal Coverage to current inpatient insulin regimen while patient getting Prednisone:  Novolog 3 units TID with meals  (Please add the following Hold Parameters: Hold if pt eats <50% of meal, Hold if pt NPO)      --Will follow patient during hospitalization--  [name] RN, MSN, CDE Diabetes Coordinator Inpatient Glycemic Control Team Team Pager: [phone] (8a-5p)

## 2018-12-02 NOTE — Progress Notes (Signed)
Kyle [name] Date of Birth: 1950/11/09 Medical Record #[id number]  History of Present Illness: Kyle [name] is seen for follow up CHF.  He has a history of systolic CHF with EF of 81-15% in May 2012. Coronary anatomy was normal by cath. He has a bicuspid AV with moderate AS/AI. He had moderate pulmonary HTN. He was treated medically and one of my old notes stated that his LV function showed marked improvement with  Echo in 2011 showing improvement in EF to 55-60%.    Unfortunately , he was lost to follow up and was not taking medication for over 2 years. He also has a history of HTN, hyperlipidemia, and PAD s/p right fem-pop BPG.   When seen in October 2015 he had evidence of recurrent CHF with severe HTN. Echo showed an EF of 20-25% with moderate AS/AI. He was started back on medication with lasix, carvedilol, and quinapril. He did not come back for follow up as instructed. He was seen in Jan. 2016 for surgical clearance for vascular surgery.  He was seen by [name] for claudication and was found to have severe PAD. Revascularization recommended. He underwent aortobifemoral BPG in February 2016. Last seen by me in April 2019- had missed multiple appointments. He did have an Echo in July 2018 showing normalization of LV function with EF 55%.  He is  diabetic and on insulin.  He was admitted in January 2020 with hypotension and shock. Had malaise, N/V and sweats. Treated with antibiotics. Had respiratory failure with hypoxia. Sent home with oxygen. He had Afib initially then converted to NSR. Started on Eliquis. Continued on Coreg. Had AKI with creatinine up to 2.7 that improved with most recent creatinine 1.03. LFTs were elevated. Echo showed EF 40-45%. There was RV dysfunction with moderate pulmonary HTN. Moderate AI with mild AS. CT negative for PE but showed advanced emphysema.   On follow up today he reports he is feeling well. Wearing oxygen at home. States he is "done" with cigarettes.  Breathing is OK. No cough. Since DC from hospital he never filled his prednisone or Eliquis prescriptions. Is taking ASA. No dizziness or palpitations. No edema.   Allergies as of 12/06/2018   No Known Allergies     Medication List       Accurate as of December 06, 2018  8:46 AM. Always use your most recent med list.        ACCU-CHEK FASTCLIX LANCETS Misc   ACCU-CHEK GUIDE test strip Generic drug:  glucose blood   amLODipine 5 MG tablet Commonly known as:  NORVASC Take 1 tablet (5 mg total) by mouth daily.   apixaban 5 MG Tabs tablet Commonly known as:  ELIQUIS Take 1 tablet (5 mg total) by mouth 2 (two) times daily.   BD PEN NEEDLE NANO U/F 32G X 4 MM Misc Generic drug:  Insulin Pen Needle   carvedilol 12.5 MG tablet Commonly known as:  COREG Take 1 tablet (12.5 mg total) by mouth 2 (two) times daily with a meal.   furosemide 40 MG tablet Commonly known as:  LASIX Take 1 tablet (40 mg total) by mouth daily.   guaiFENesin 600 MG 12 hr tablet Commonly known as:  MUCINEX Take 2 tablets (1,200 mg total) by mouth 2 (two) times daily.   insulin glargine 100 UNIT/ML injection Commonly known as:  LANTUS Inject 15 Units into the skin at bedtime.   metFORMIN 500 MG tablet Commonly known as:  GLUCOPHAGE Take by mouth  2 (two) times daily with a meal.   pravastatin 40 MG tablet Commonly known as:  PRAVACHOL TAKE 1 TABLET AT BEDTIME   quinapril 10 MG tablet Commonly known as:  ACCUPRIL Take 1 tablet (10 mg total) by mouth daily.        No Known Allergies  Past Medical History:  Diagnosis Date  . Aortic insufficiency    MODERATE WITH A BICUSPID AORTIC VAVLE  . Arterial occlusive disease    MULTILEVEL  . CHF (congestive heart failure) ([place])   . COPD (chronic obstructive pulmonary disease) ([place])   . Dilated cardiomyopathy ([facility])    WITH EJECTION FRACTION DOWN TO 20-25%--WITH CONGESTIVE HEART FAILURE  . Edema    LOWER EXTREMETIES  . Hypertension   . Normal  coronary arteries 2009  . Orthopnea   . Peripheral arterial disease ([place])   . Pulmonary hypertension ([place])   . SOB (shortness of breath)     Past Surgical History:  Procedure Laterality Date  . ABDOMINAL AORTAGRAM N/A 11/05/2014   Procedure: ABDOMINAL Maxcine Ham;  Surgeon: Angelia Mould, MD;  Location: [facility] CATH LAB;  Service: Cardiovascular;  Laterality: N/A;  . AORTA - BILATERAL FEMORAL ARTERY BYPASS GRAFT N/A 12/18/2014   Procedure: AORTOBIFEMORAL BYPASS GRAFT;  Surgeon: Angelia Mould, MD;  Location: [facility];  Service: Vascular;  Laterality: N/A;  . CARDIAC CATHETERIZATION  2009   Nl Cors, EF 20%  . COLONOSCOPY    . ENDARTERECTOMY FEMORAL Left 12/18/2014   Procedure: ENDARTERECTOMY FEMORAL;  Surgeon: Angelia Mould, MD;  Location: [facility];  Service: Vascular;  Laterality: Left;  . FEMORAL-POPLITEAL BYPASS GRAFT  02/21/2011   right  . OTHER SURGICAL HISTORY     POST RIGHT FEMOROPOPLITEAL BYPASS GRAFT  . PERIPHERAL VASCULAR CATHETERIZATION  11/05/2014   Procedure: LOWER EXTREMITY ANGIOGRAPHY;  Surgeon: Angelia Mould, MD;  Location[facility] CATH LAB;  Service: Cardiovascular;;    Social History   Socioeconomic History  . Marital status: Married    Spouse name: Not on file  . Number of children: 1  . Years of education: Not on file  . Highest education level: Not on file  Occupational History    Employer: [employer]  Social Needs  . Financial resource strain: Not on file  . Food insecurity:    Worry: Not on file    Inability: Not on file  . Transportation needs:    Medical: Not on file    Non-medical: Not on file  Tobacco Use  . Smoking status: Former Smoker    Packs/day: 1.00  . Smokeless tobacco: Never Used  . Tobacco comment: quit 2010  Substance and Sexual Activity  . Alcohol use: No    Alcohol/week: 0.0 standard drinks  . Drug use: No  . Sexual activity: Not on file  Lifestyle  . Physical activity:    Days per week: Not on file    Minutes  per session: Not on file  . Stress: Not on file  Relationships  . Social connections:    Talks on phone: Not on file    Gets together: Not on file    Attends religious service: Not on file    Active member of club or organization: Not on file    Attends meetings of clubs or organizations: Not on file    Relationship status: Not on file  Other Topics Concern  . Not on file  Social History Narrative  . Not on file    Family History  Problem  Relation Age of Onset  . Diabetes Mother   . Hyperlipidemia Sister   . Hypertension Sister     Review of Systems: As noted in HPI.   All other systems were reviewed and are negative.  Physical Exam: BP (!) 120/56   Pulse 70   Ht 5\' 11"  (1.803 m)   Wt 149 lb 9.6 oz (67.9 kg)   BMI 20.86 kg/m  Filed Weights   12/06/18 0827  Weight: 149 lb 9.6 oz (67.9 kg)    GENERAL:  Well appearing, thin BM in NAD HEENT:  PERRL, EOMI, sclera are clear. Oropharynx is clear. NECK:  No jugular venous distention, carotid upstroke brisk and symmetric, no bruits, no thyromegaly or adenopathy LUNGS:  Clear to auscultation bilaterally CHEST:  Unremarkable HEART:  RRR,  PMI not displaced or sustained,S1 and S2 within normal limits, no S3, no S4: no clicks, no rubs, gr 1/6 systolic and diastolic murmur RUSB.  ABD:  Soft, nontender. BS +, no masses or bruits. No hepatomegaly, no splenomegaly EXT:  2 + pulses throughout, no edema, no cyanosis no clubbing SKIN:  Warm and dry.  No rashes NEURO:  Alert and oriented x 3. Cranial nerves II through XII intact. PSYCH:  Cognitively intact   LABORATORY DATA: . Lab Results  Component Value Date   WBC 13.9 (H) 11/30/2018   HGB 15.5 11/30/2018   HCT 48.0 11/30/2018   PLT 234 11/30/2018   GLUCOSE 197 (H) 11/30/2018   CHOL 149 10/16/2016   TRIG 101 10/16/2016   HDL 36 (L) 10/16/2016   LDLCALC 93 10/16/2016   ALT 307 (H) 11/25/2018   AST 70 (H) 11/25/2018   NA 139 11/30/2018   K 4.4 11/30/2018   CL 102  11/30/2018   CREATININE 1.03 11/30/2018   BUN 21 11/30/2018   CO2 27 11/30/2018   TSH 3.244 08/20/2014   INR 1.13 11/25/2018   HGBA1C 6.9 (H) 11/21/2018    Labs dated 07/28/17: BUN 29, creatinine 1.03. Other chemistries normal. A1c 6.2%. Cholesterol 137, Triglycerides 63, HDL 43, LDL 81, TSH normal. Labs dated 12/21/17: cholesterol 123, triglycerides 64, HDL 41, LDL 69. A1c 6.3%. CMET and CBC normal. TSH normal.  Ecg today shows NSR with rate 70. LVH. I have personally reviewed and interpreted this study.  Echo: 05/06/17:  Study Conclusions  - Left ventricle: The cavity size was normal. Wall thickness was   increased in a pattern of mild LVH. Systolic function was normal.   The estimated ejection fraction was in the range of 50% to 55%.   Wall motion was normal; there were no regional wall motion   abnormalities. Doppler parameters are consistent with abnormal   left ventricular relaxation (grade 1 diastolic dysfunction). - Aortic valve: There was mild stenosis. There was mild to moderate   regurgitation. - Mitral valve: Calcified annulus. - Left atrium: The atrium was mildly dilated. - Right ventricle: The cavity size was mildly dilated. - Right atrium: The atrium was mildly dilated. - Pulmonary arteries: Systolic pressure was moderately increased.  Impressions:  - Normal LV systolic function; mild LVH; mild diastolic   dysfunction; calcified aortic valve with mild AS (mean gradient   18 mmHg) and mild to moderate AI; mild LAE; mild RAE and RVE;   trace TR with moderately elevated pulmonary pressure.  Echo 11/21/18: Study Conclusions  - Left ventricle: The cavity size was normal. Systolic function was   mildly to moderately reduced. The estimated ejection fraction was   in the range  of 40% to 45%. Diffuse hypokinesis. The study is not   technically sufficient to allow evaluation of LV diastolic   function. - Ventricular septum: The contour showed diastolic flattening. -  Aortic valve: Valve mobility was restricted. There was mild   stenosis. There was moderate regurgitation. - Right ventricle: The cavity size was moderately dilated. Wall   thickness was normal. Systolic function was moderately reduced. - Right atrium: The atrium was severely dilated. - Tricuspid valve: There was moderate regurgitation. - Pulmonary arteries: Systolic pressure was moderately increased.   PA peak pressure: 53 mm Hg (S).  Impressions:  - Compared to the prior study, there has been no significant   interval change.   Assessment / Plan: 1. Chronic systolic CHF.  He has a history of cardiomyopathy- nonischemic. EF normalized in past when compliant with medication. Most recent Echo showed drop in EF to 40-45% possibly related to sepsis and medication noncompliance.  Reinforced importance of compliance with medical therapy. Low sodium diet. Continue lasix, Coreg, Quinapril. Appears to be euvolemic.    2. HTN - BP is well controlled today.  3. PAD s/p right fem-pop bypass. S/p aortobifemoral BPG Feb. 2016. He did have LE arterial dopplers in March 2017 showing ABI .58 on the right and .48 on the left. Follow up with vascular.   4. Hyperlipidemia. Fair control on pravastatin. Enforce compliance.   5. Biscuspid AV with mild AS/moderate AI.   6. Tobacco abuse- counseled on complete smoking cessation.  7. DM now on insulin per primary care.  8. Advanced emphysema. On home oxygen now. Will refer to pulmonary. Since he never filled his prednisone Rx will leave off for now.   9. Afib in setting of sepsis. In NSR now. Recommend Eliquis to reduce risk of embolic phenomena. Stop ASA and avoid NSAIDs.

## 2018-12-03 NOTE — Discharge Summary (Signed)
[facility] Discharge Summary   Patient: Kyle [name] IOX:[id number]   PCP: Alonna Buckler, MD DOB: [birth year]   Date of admission: 11/20/2018   Date of discharge: 11/30/2018     Discharge Diagnoses:   Principal Problem:   Sepsis [facility]) Active Problems:   Aortic insufficiency   Peripheral arterial disease (HCC)   Dilated cardiomyopathy (HCC)   Chronic systolic CHF (congestive heart failure) ([place])   Pulmonary hypertension (HCC)   Protein-calorie malnutrition, severe (HCC)   AKI (acute kidney injury) ([place])   Admitted From: home Disposition:  home  Recommendations for Outpatient Follow-up:  1. Please follow up with PCP in 1 week   Follow-up Information    Alonna Buckler, MD. Schedule an appointment as soon as possible for a visit in 1 week(s).   Specialty:  Family Medicine Contact information: [address]  [address] [phone]        [facility] Follow up.   Contact information: [place] [address] [phone]          Diet recommendation: cardiac diet  Activity: The patient is advised to gradually reintroduce usual activities.  Discharge Condition: good  Code Status: full code  History of present illness: As per the H and P dictated on admission, "Kyle [name] is a 68 y.o. male with PMHx concerning for CHF (EF 20-25% that has improved on medical therapy to 50-55% on last echo in 04/2017), moderate pHTN, PAD s/p right fem-pop BPG and aortobifemoral BPG in 2016, who presents to ED after two days worth of progressive malaise associated with N/V/D and sputum production with night sweats and chills the night before admission and syncope the day of admission. He presented with initial evaluation significant for hypotension requiring vasopressor support of levophed peripherally after 2.5L of IVF did not improve his hemodynamics. CXR revealed bibasilar haziness and right heart boarder blurring concerning for  potential RML and lower lobe involvement for infectious etiology. CT head was normal. Troponin elevated to 3, although EKG showed no overt changes concerning for ischemia and he denies anginal chest pain or paliptations. Other laboratory abnormalities include lactic acidosis to 4.1, transaminitis to >800 in both AST/ALT without elevated alk phos, AKI with Cr at 2.73 (baseline ~1.0), and anion gap metabolic acidosis. The patient does state to having a sick contact (wife) who has had URI symptoms for the past few days at home prior to his initial symptoms. Denies substance abuse besides "the occasional cigarette" but no illicit drug use or alcohol use. He will be admitted to the ICU for levophed administration and monitoring.  DDx includes sepsis (unclear source but potentials include respiratory as CXR showed changes concerning for PNA, GI with the patient's history of N/V/D, and urinary. Patient has elevated LA with hypotension but no leukocytosis.), Myocardial ischemia (Troponin elevated, which could be secondary to demand ischemia as EKG did not show ST changes but the patient does have high risk history of vascular disease), and bowel ischemia (least likely given the lack of abdominal pain, however vascular disease with N/V/D and lactic acidosis prompts this to remain potential.)"  Hospital Course:  Summary of his active problems in the hospital is as following. Principal Problem:   Sepsis ([place]) Active Problems:   Aortic insufficiency   Peripheral arterial disease (HCC)   Dilated cardiomyopathy (HCC)   Chronic systolic CHF (congestive heart failure) ([place])   Pulmonary hypertension (HCC)   Protein-calorie malnutrition, severe ([place])  AKI (acute kidney injury) ([place])   Shock -Resolved, per PCCM probably hypovolemic shock due to nausea vomiting decreased oral intake -Treated with fluid resuscitation and pressors in ICU -Improved -Blood cultures on admission 1/26, 1/2 bottles with strep  viridans -Did not have fevers or leukocytosis on admission, did have some cough and congestion, discussed with ID Dr.Comer could have been transient bacteremia from oral source, repeat cultures are negative, afebrile nontoxic, recommended stopping Abx  -2D echocardiogram unremarkable as well -will discontinue ceftriaxone and monitor  Acute hypoxic respiratory failure. Recently treated COPD exacerbation -Now better with chest PT with vest -Antibiotics as above, add duo nebs, steroid taper Severely hypoxic.  While the patient has completed his antibiotic treatment remains severely hypoxic and therefore CT scan of the PE was performed to rule out any pulmonary embolism which has been negative for PE. Show significant mucus plugging. Will use nebulizer as well as prednisone Mucinex and chest PT. home with oxygen.  Acute kidney injury -Likely due to hypoperfusion/hypotension, creatinine improving was 2.7 on admission, down to 1.9 now -off IVF now, creatinine has normalized  Type 2 diabetes mellitus with uncontrolled hyperglycemia without any complication. Blood sugars are currently elevated secondary to steroids. On metformin at home.  As well as Lantus 15 units.  Transaminitis -Probably shock liver, LFTs improving, he was hypotensive on admission  Acute on chronic combined systolic and diastolic CHF. Pulmonary hypertension. -ECHO with EF of 40 to 45%, mild left ear and moderate AR, unchanged from prior -No coronary disease disease based on prior cath -appears slightly vol overloaded, treated with IV Lasix.    Paroxysmal atrial fibrillation -Back in sinus rhythm now, carvedilol on hold with hypotension, -restarted coreg, and newly started on Apixaban  History of bicuspid aortic valve, mild AS, moderate AR -Outpatient follow-up  hypertension with urgency. Blood pressure remained stable elevated. Resume Norvasc, Coreg dose adjusted We will increase lisinopril dose.   Monitor.  Patient was ambulatory without any assistance. On the day of the discharge the patient's vitals were stable , and no other acute medical condition were reported by patient. the patient was felt safe to be discharge at home with family.  Consultants: cardiology  PCCM  Procedures: Echocardiogram   DISCHARGE MEDICATION: Allergies as of 11/30/2018   No Known Allergies     Medication List    STOP taking these medications   oxyCODONE-acetaminophen 5-325 MG tablet Commonly known as:  PERCOCET/ROXICET     TAKE these medications   amLODipine 5 MG tablet Commonly known as:  NORVASC Take 1 tablet (5 mg total) by mouth daily.   apixaban 5 MG Tabs tablet Commonly known as:  ELIQUIS Take 1 tablet (5 mg total) by mouth 2 (two) times daily.   aspirin 81 MG chewable tablet Chew 1 tablet (81 mg total) by mouth daily.   carvedilol 12.5 MG tablet Commonly known as:  COREG Take 1 tablet (12.5 mg total) by mouth 2 (two) times daily with a meal.   furosemide 40 MG tablet Commonly known as:  LASIX Take 1 tablet (40 mg total) by mouth daily.   guaiFENesin 600 MG 12 hr tablet Commonly known as:  MUCINEX Take 2 tablets (1,200 mg total) by mouth 2 (two) times daily.   insulin glargine 100 UNIT/ML injection Commonly known as:  LANTUS Inject 15 Units into the skin at bedtime.   metFORMIN 500 MG tablet Commonly known as:  GLUCOPHAGE Take by mouth 2 (two) times daily with a meal.   pravastatin 40 MG  tablet Commonly known as:  PRAVACHOL TAKE 1 TABLET AT BEDTIME What changed:    how much to take  how to take this  when to take this  additional instructions   predniSONE 10 MG tablet Commonly known as:  DELTASONE Take '40mg'$  daily for 3days,Take '30mg'$  daily for 3days,Take '20mg'$  daily for 3days,Take '10mg'$  daily for 3days, then stop   quinapril 10 MG tablet Commonly known as:  ACCUPRIL Take 1 tablet (10 mg total) by mouth daily.      No Known Allergies Discharge Instructions     Diet - low sodium heart healthy   Complete by:  As directed    Discharge instructions   Complete by:  As directed    It is important that you read the given instructions as well as go over your medication list with RN to help you understand your care after this hospitalization.  Discharge Instructions: Please follow-up with PCP in 1-2 weeks  Please request your primary care physician to go over all Hospital Tests and Procedure/Radiological results at the follow up. Please get all Hospital records sent to your PCP by signing hospital release before you go home.   Do not take more than prescribed Pain, Sleep and Anxiety Medications. You were cared for by a hospitalist during your hospital stay. If you have any questions about your discharge medications or the care you received while you were in the hospital after you are discharged, you can call the unit '@UNIT'$ @ you were admitted to and ask to speak with the hospitalist on call if the hospitalist that took care of you is not available.  Once you are discharged, your primary care physician will handle any further medical issues. Please note that NO REFILLS for any discharge medications will be authorized once you are discharged, as it is imperative that you return to your primary care physician (or establish a relationship with a primary care physician if you do not have one) for your aftercare needs so that they can reassess your need for medications and monitor your lab values. You Must read complete instructions/literature along with all the possible adverse reactions/side effects for all the Medicines you take and that have been prescribed to you. Take any new Medicines after you have completely understood and accept all the possible adverse reactions/side effects. Wear Seat belts while driving. If you have smoked or chewed Tobacco in the last 2 yrs please stop smoking and/or stop any Recreational drug use.  If you drink alcohol, please  moderate the use and do not drive, operating heavy machinery, perform activities at heights, swimming or participation in water activities or provide baby sitting services under influence.   Increase activity slowly   Complete by:  As directed      Discharge Exam: Filed Weights   11/24/18 0411 11/29/18 0546 11/30/18 0300  Weight: 72.2 kg 68.2 kg 68 kg   Vitals:   11/30/18 0808 11/30/18 0911  BP: (!) 173/78 (!) 173/78  Pulse: (!) 59 68  Resp: 15   Temp:    SpO2: 93%    General: Appear in no distress, no Rash; Oral Mucosa moist. Cardiovascular: S1 and S2 Present, no Murmur, no JVD Respiratory: Bilateral Air entry present and Clear to Auscultation, no Crackles, no wheezes Abdomen: Bowel Sound present, Soft and no tenderness Extremities: no Pedal edema, no calf tenderness Neurology: Grossly no focal neuro deficit.  The results of significant diagnostics from this hospitalization (including imaging, microbiology, ancillary and laboratory) are listed below for  reference.    Significant Diagnostic Studies: Dg Chest 2 View  Result Date: 11/22/2018 CLINICAL DATA:  Hypoxia. EXAM: CHEST - 2 VIEW COMPARISON:  Radiograph of November 20, 2018. FINDINGS: Stable cardiomegaly. No pneumothorax is noted. Left lung is clear. Minimal right basilar subsegmental atelectasis is noted with minimal right pleural effusion. Bony thorax is unremarkable. IMPRESSION: Minimal right basilar subsegmental atelectasis with minimal right pleural effusion. Electronically Signed   By: Marijo Conception, M.D.   On: 11/22/2018 10:12   Ct Head Wo Contrast  Result Date: 11/20/2018 CLINICAL DATA:  Nausea and vomiting EXAM: CT HEAD WITHOUT CONTRAST TECHNIQUE: Contiguous axial images were obtained from the base of the skull through the vertex without intravenous contrast. COMPARISON:  None. FINDINGS: Brain: No evidence of acute infarction, hemorrhage, hydrocephalus, extra-axial collection or mass lesion/mass effect. Vascular: No  hyperdense vessel or unexpected calcification. Skull: Normal. Negative for fracture or focal lesion. Sinuses/Orbits: No acute finding. Other: None. IMPRESSION: Normal head CT. Electronically Signed   By: Inez Catalina M.D.   On: 11/20/2018 21:25   Ct Angio Chest Pe W Or Wo Contrast  Result Date: 11/26/2018 CLINICAL DATA:  Shortness of breath and hypoxia. EXAM: CT ANGIOGRAPHY CHEST WITH CONTRAST TECHNIQUE: Multidetector CT imaging of the chest was performed using the standard protocol during bolus administration of intravenous contrast. Multiplanar CT image reconstructions and MIPs were obtained to evaluate the vascular anatomy. CONTRAST:  42m ISOVUE-370 IOPAMIDOL (ISOVUE-370) INJECTION 76% COMPARISON:  Current chest radiograph and prior chest radiographs. FINDINGS: Cardiovascular: The pulmonary arteries are well opacified to the segmental level. There is no evidence of a pulmonary embolism. Moderate enlargement of the heart. Trace pericardial effusion. Two vessel coronary artery calcifications. Aorta is normal caliber. There are aortic atherosclerotic calcifications. Aorta is not opacified. Mediastinum/Nodes: No neck base or axillary masses or pathologically enlarged lymph nodes. There are prominent mediastinal lymph nodes, largest a left para carinal node measuring 12 mm short axis. No mediastinal or hilar masses. Trachea and esophagus are unremarkable. Lungs/Pleura: Trace pleural effusions, more on the right. Bronchial wall thickening, most evident in the lower lobes, with secretions occluding several subsegmental bronchi. There is dependent opacity in both lower lobes consistent with atelectasis, greater on the right. Lungs demonstrate advanced changes of centrilobular emphysema. No mass or suspicious nodule. No evidence of pulmonary edema. No pneumothorax. Upper Abdomen: No acute abnormality. Musculoskeletal: No fracture or acute finding. No osteoblastic or osteolytic lesions. Review of the MIP images  confirms the above findings. IMPRESSION: 1. No evidence of a pulmonary embolism. 2. Trace pleural effusions. Bronchial wall thickening with several subsegmental lower lobe bronchi occluded with secretions/mucus. There is dependent lower lobe atelectasis, right greater than left. No convincing pneumonia. 3. Advanced centrilobular emphysema. 4. No convincing pulmonary edema. Aortic Atherosclerosis (ICD10-I70.0) and Emphysema (ICD10-J43.9). Electronically Signed   By: DLajean ManesM.D.   On: 11/26/2018 18:11   Dg Chest Portable 1 View  Result Date: 11/20/2018 CLINICAL DATA:  High blood sugar and shortness of breath EXAM: PORTABLE CHEST 1 VIEW COMPARISON:  12/23/2014 FINDINGS: Cardiac shadow is enlarged. Aortic calcifications are noted. The lungs are well aerated bilaterally. No focal infiltrate is seen. Some stable scarring is noted in the bases bilaterally unchanged from the prior exam. No bony abnormality is seen. IMPRESSION: Mild bibasilar scarring.  No acute abnormality is noted. Electronically Signed   By: MInez CatalinaM.D.   On: 11/20/2018 21:58    Microbiology: No results found for this or any previous visit (from the past  240 hour(s)).   Labs: CBC: Recent Labs  Lab 11/26/18 0355 11/27/18 0340 11/28/18 0336 11/29/18 0309 11/30/18 0240  WBC 8.8 7.7 17.5* 16.2* 13.9*  HGB 16.7 16.0 15.8 15.8 15.5  HCT 50.1 48.2 48.7 48.6 48.0  MCV 90.8 90.1 90.7 90.0 90.4  PLT 166 220 238 246 088   Basic Metabolic Panel: Recent Labs  Lab 11/26/18 0355 11/27/18 0340 11/28/18 0336 11/29/18 0309 11/30/18 0240  NA 140 138 141 138 139  K 4.4 4.9 4.8 5.0 4.4  CL 100 103 102 100 102  CO2 '29 25 31 29 27  '$ GLUCOSE 89 208* 144* 145* 197*  BUN 19 24* 29* 31* 21  CREATININE 0.94 1.07 1.18 1.21 1.03  CALCIUM 9.0 9.0 9.0 9.2 8.9  MG 1.9  --  2.2  --   --    Liver Function Tests: No results for input(s): AST, ALT, ALKPHOS, BILITOT, PROT, ALBUMIN in the last 168 hours. No results for input(s): LIPASE,  AMYLASE in the last 168 hours. No results for input(s): AMMONIA in the last 168 hours. Cardiac Enzymes: No results for input(s): CKTOTAL, CKMB, CKMBINDEX, TROPONINI in the last 168 hours. BNP (last 3 results) Recent Labs    11/20/18 2341  BNP 709.2*   CBG: Recent Labs  Lab 11/29/18 1157 11/29/18 1708 11/29/18 2128 11/30/18 0718 11/30/18 1128  GLUCAP 235* 231* 236* 109* 191*   Time spent: 35 minutes  Signed:  Berle Mull  [facility] 11/30/2018

## 2018-12-06 ENCOUNTER — Encounter: Payer: Self-pay | Admitting: Cardiology

## 2018-12-06 ENCOUNTER — Ambulatory Visit: Payer: Medicare HMO | Admitting: Cardiology

## 2018-12-06 VITALS — BP 120/56 | HR 70 | Ht 71.0 in | Wt 149.6 lb

## 2018-12-06 DIAGNOSIS — I5022 Chronic systolic (congestive) heart failure: Secondary | ICD-10-CM

## 2018-12-06 DIAGNOSIS — I351 Nonrheumatic aortic (valve) insufficiency: Secondary | ICD-10-CM

## 2018-12-06 DIAGNOSIS — I10 Essential (primary) hypertension: Secondary | ICD-10-CM

## 2018-12-06 DIAGNOSIS — I420 Dilated cardiomyopathy: Secondary | ICD-10-CM

## 2018-12-06 DIAGNOSIS — I739 Peripheral vascular disease, unspecified: Secondary | ICD-10-CM | POA: Diagnosis not present

## 2018-12-06 DIAGNOSIS — J439 Emphysema, unspecified: Secondary | ICD-10-CM

## 2018-12-06 NOTE — Addendum Note (Signed)
Addended by: Jake Seats on: 12/06/2018 09:07 AM   Modules accepted: Orders

## 2018-12-06 NOTE — Patient Instructions (Signed)
Do not fill the prednisone  Stop taking ASA  Do not take Ibuprofen or other NSAIDs while on Eliquis.  We will refer you to pulmonary  Follow up in 3 months.

## 2018-12-16 ENCOUNTER — Ambulatory Visit: Payer: Medicare HMO | Admitting: Pulmonary Disease

## 2018-12-16 ENCOUNTER — Encounter: Payer: Self-pay | Admitting: Pulmonary Disease

## 2018-12-16 VITALS — BP 138/60 | HR 88 | Ht 71.0 in | Wt 152.8 lb

## 2018-12-16 DIAGNOSIS — J9611 Chronic respiratory failure with hypoxia: Secondary | ICD-10-CM

## 2018-12-16 DIAGNOSIS — J449 Chronic obstructive pulmonary disease, unspecified: Secondary | ICD-10-CM | POA: Insufficient documentation

## 2018-12-16 DIAGNOSIS — R0602 Shortness of breath: Secondary | ICD-10-CM | POA: Diagnosis not present

## 2018-12-16 DIAGNOSIS — J9621 Acute and chronic respiratory failure with hypoxia: Secondary | ICD-10-CM | POA: Insufficient documentation

## 2018-12-16 MED ORDER — UMECLIDINIUM-VILANTEROL 62.5-25 MCG/INH IN AEPB: 1.0000 | INHALATION_SPRAY | Freq: Every day | RESPIRATORY_TRACT | 2 refills | Status: DC

## 2018-12-16 MED ORDER — UMECLIDINIUM-VILANTEROL 62.5-25 MCG/INH IN AEPB: 1.0000 | INHALATION_SPRAY | Freq: Every day | RESPIRATORY_TRACT | 0 refills | Status: AC

## 2018-12-16 NOTE — Progress Notes (Signed)
Subjective:    Patient ID: Kyle [name], male    DOB: 1951-03-17, 68 y.o.   MRN: [id number]  HPI  Chief Complaint  Patient presents with  . Consult    out of hospital two weeks ago for kidney problems and had breathing problems. Cardiologist sent him here- feels his breathing has improved - a little cong cough remains and some SOB . uses O2 at hime    68 year old smoker presents for evaluation of COPD and hypoxia after recent hospitalization. He smoked about a pack per day starting as a teenager until he quit about 2 months ago -about 50 pack years. He has a prior medical history of nonischemic cardiomyopathy with EF around 25% and recovered to 50% 04/2017 and peripheral arterial disease status post right femoropopliteal and aortobifemoral bypass in 2016 He was hospitalized 1/26 to 2/5 for strep viridans bacteremia when he presented with hypotension, elevated troponin and LFTs and acute kidney injury with creatinine increasing to 2.7-source of bacteremia was unclear, by discharge creatinine decreased to 1.9.  Echo showed EF of 40 to 45% developed paroxysmal atrial fibrillation and has follow-up with cardiology and was placed on anticoagulation. He remained hypoxic at the time of discharge and was discharged on 2 L nasal cannula, CT angiogram was performed which I reviewed that shows significant emphysema was negative for pulmonary  .  The lower lobe bronchi seemed occluded with secretions with some dependent opacity which was attributed to atelectasis  Since discharge, he reports significant improvement and is back to baseline.  He denies dyspnea but his wife Mickel Baas states that he does get short of breath walking stairs.  He denies wheezing.  He does tend to minimize his complaints he states that he uses oxygen at home and during sleep but has not brought it with him today, oxygen saturation is 91% on room air This dropped to 85% on ambulation after the first lap and needed 4 L to keep him about  90% during ambulation  Spirometry showed severe airway obstruction with ratio of 64, FEV1 of 36% FVC of 43%     Past Medical History:  Diagnosis Date  . Aortic insufficiency    MODERATE WITH A BICUSPID AORTIC VAVLE  . Arterial occlusive disease    MULTILEVEL  . CHF (congestive heart failure) ([place])   . COPD (chronic obstructive pulmonary disease) ([place])   . Dilated cardiomyopathy (HCC)    WITH EJECTION FRACTION DOWN TO 20-25%--WITH CONGESTIVE HEART FAILURE  . Edema    LOWER EXTREMETIES  . Hypertension   . Normal coronary arteries 2009  . Orthopnea   . Peripheral arterial disease ([place])   . Pulmonary hypertension ([place])   . SOB (shortness of breath)     Past Surgical History:  Procedure Laterality Date  . ABDOMINAL AORTAGRAM N/A 11/05/2014   Procedure: ABDOMINAL Maxcine Ham;  Surgeon: Angelia Mould, MD;  Location: [facility] CATH LAB;  Service: Cardiovascular;  Laterality: N/A;  . AORTA - BILATERAL FEMORAL ARTERY BYPASS GRAFT N/A 12/18/2014   Procedure: AORTOBIFEMORAL BYPASS GRAFT;  Surgeon: Angelia Mould, MD;  Location: [place];  Service: Vascular;  Laterality: N/A;  . CARDIAC CATHETERIZATION  2009   Nl Cors, EF 20%  . COLONOSCOPY    . ENDARTERECTOMY FEMORAL Left 12/18/2014   Procedure: ENDARTERECTOMY FEMORAL;  Surgeon: Angelia Mould, MD;  Location: [name];  Service: Vascular;  Laterality: Left;  . FEMORAL-POPLITEAL BYPASS GRAFT  02/21/2011   right  . OTHER SURGICAL HISTORY  POST RIGHT FEMOROPOPLITEAL BYPASS GRAFT  . PERIPHERAL VASCULAR CATHETERIZATION  11/05/2014   Procedure: LOWER EXTREMITY ANGIOGRAPHY;  Surgeon: Angelia Mould, MD;  Location[facility] CATH LAB;  Service: Cardiovascular;;    No Known Allergies   Social History   Socioeconomic History  . Marital status: Married    Spouse name: Not on file  . Number of children: 1  . Years of education: Not on file  . Highest education level: Not on file  Occupational History    Employer: [employer]    Social Needs  . Financial resource strain: Not on file  . Food insecurity:    Worry: Not on file    Inability: Not on file  . Transportation needs:    Medical: Not on file    Non-medical: Not on file  Tobacco Use  . Smoking status: Former Smoker    Packs/day: 1.00    Years: 40.00    Pack years: 40.00    Last attempt to quit: 10/15/2018    Years since quitting: 0.1  . Smokeless tobacco: Never Used  . Tobacco comment: quit 2010 and quit again 2019  Substance and Sexual Activity  . Alcohol use: No    Alcohol/week: 0.0 standard drinks  . Drug use: No  . Sexual activity: Not on file  Lifestyle  . Physical activity:    Days per week: Not on file    Minutes per session: Not on file  . Stress: Not on file  Relationships  . Social connections:    Talks on phone: Not on file    Gets together: Not on file    Attends religious service: Not on file    Active member of club or organization: Not on file    Attends meetings of clubs or organizations: Not on file    Relationship status: Not on file  . Intimate partner violence:    Fear of current or ex partner: Not on file    Emotionally abused: Not on file    Physically abused: Not on file    Forced sexual activity: Not on file  Other Topics Concern  . Not on file  Social History Narrative  . Not on file     Family History  Problem Relation Age of Onset  . Diabetes Mother   . Hyperlipidemia Sister   . Hypertension Sister       Review of Systems  Constitutional: Negative for fever and unexpected weight change.  HENT: Positive for congestion. Negative for dental problem, ear pain, nosebleeds, postnasal drip, rhinorrhea, sinus pressure, sneezing, sore throat and trouble swallowing.   Eyes: Negative for redness and itching.  Respiratory: Positive for cough and shortness of breath. Negative for chest tightness and wheezing.   Cardiovascular: Negative for palpitations and leg swelling.  Gastrointestinal: Negative for nausea  and vomiting.  Genitourinary: Negative for dysuria.  Musculoskeletal: Negative for joint swelling.  Skin: Negative for rash.  Allergic/Immunologic: Negative.  Negative for environmental allergies, food allergies and immunocompromised state.  Neurological: Negative for headaches.  Hematological: Bruises/bleeds easily.  Psychiatric/Behavioral: Negative for dysphoric mood. The patient is not nervous/anxious.        Objective:   Physical Exam  Gen. Pleasant, thin, in no distress, normal affect ENT - no pallor,icterus, no post nasal drip Neck: No JVD, no thyromegaly, no carotid bruits Lungs: no use of accessory muscles, no dullness to percussion,RLLt rales no rhonchi  Cardiovascular: Rhythm regular, heart sounds  normal, no murmurs or gallops, no peripheral edema Abdomen:  soft and non-tender, no hepatosplenomegaly, BS normal. Musculoskeletal: No deformities, no cyanosis or clubbing Neuro:  alert, non focal       Assessment & Plan:

## 2018-12-16 NOTE — Assessment & Plan Note (Signed)
Okay to stay off oxygen at rest. 2 L oxygen during sleep and 4 L on ambulation. Recheck next visit in 4 weeks and if he still needs then will request POC

## 2018-12-16 NOTE — Patient Instructions (Signed)
You have severe emphysema, lung capacity is at 36%. Trial of Anoro once daily, call me for prescription if this helps you.  Use oxygen when walking and during sleep.  Okay to stay off oxygen while at rest

## 2018-12-16 NOTE — Assessment & Plan Note (Signed)
severe emphysema, lung capacity is at 36%. Trial of Anoro once daily, call me for prescription if this helps you.  Smoking cessation again emphasized. We will discuss pulmonary rehab in the future

## 2019-01-03 ENCOUNTER — Other Ambulatory Visit: Payer: Self-pay | Admitting: Cardiology

## 2019-01-04 ENCOUNTER — Other Ambulatory Visit: Payer: Self-pay | Admitting: Cardiology

## 2019-01-05 ENCOUNTER — Other Ambulatory Visit: Payer: Self-pay | Admitting: *Deleted

## 2019-01-05 MED ORDER — APIXABAN 5 MG PO TABS: 5.0000 mg | ORAL_TABLET | Freq: Two times a day (BID) | ORAL | 5 refills | Status: DC

## 2019-01-17 ENCOUNTER — Ambulatory Visit: Payer: Medicare HMO | Admitting: Pulmonary Disease

## 2019-03-02 ENCOUNTER — Telehealth: Payer: Self-pay | Admitting: Cardiology

## 2019-03-02 NOTE — Telephone Encounter (Signed)
Mychart, smartphone. Pre reg complete 03/02/19 AF

## 2019-03-03 ENCOUNTER — Telehealth: Payer: Self-pay | Admitting: Cardiology

## 2019-03-03 NOTE — Telephone Encounter (Signed)
Error

## 2019-03-03 NOTE — Telephone Encounter (Signed)
LVM for patient to call and reschedule. °

## 2019-03-06 ENCOUNTER — Telehealth: Payer: Medicare HMO | Admitting: Cardiology

## 2019-03-06 ENCOUNTER — Ambulatory Visit: Payer: Medicare HMO | Admitting: Cardiology

## 2019-03-21 ENCOUNTER — Telehealth: Payer: Self-pay

## 2019-03-21 NOTE — Telephone Encounter (Signed)
Pts wife called and said that he is having some right side leg pain and that he is complaining of his toes being cold.   There was a referral in for him to be seen here as well. Appt made for pt to have studies and OV with Dr Scot Dock per her request. Advied if symptoms get worse to call us back or go to the ER for evaluation.   York Cerise, CMA

## 2019-03-24 ENCOUNTER — Other Ambulatory Visit: Payer: Self-pay | Admitting: Cardiology

## 2019-03-25 ENCOUNTER — Other Ambulatory Visit: Payer: Self-pay | Admitting: Cardiology

## 2019-03-26 ENCOUNTER — Other Ambulatory Visit: Payer: Self-pay | Admitting: Pulmonary Disease

## 2019-03-29 ENCOUNTER — Other Ambulatory Visit: Payer: Self-pay

## 2019-03-29 DIAGNOSIS — I739 Peripheral vascular disease, unspecified: Secondary | ICD-10-CM

## 2019-04-05 ENCOUNTER — Encounter: Payer: Self-pay | Admitting: Vascular Surgery

## 2019-04-05 ENCOUNTER — Other Ambulatory Visit: Payer: Self-pay

## 2019-04-05 ENCOUNTER — Ambulatory Visit (INDEPENDENT_AMBULATORY_CARE_PROVIDER_SITE_OTHER): Payer: Medicare HMO | Admitting: Vascular Surgery

## 2019-04-05 ENCOUNTER — Ambulatory Visit (HOSPITAL_COMMUNITY)
Admission: RE | Admit: 2019-04-05 | Discharge: 2019-04-05 | Disposition: A | Discharge status: Home / Self Care | Payer: Medicare HMO | Source: Ambulatory Visit | Attending: Family | Admitting: Family

## 2019-04-05 VITALS — BP 126/68 | HR 68 | Temp 98.1°F | Resp 20 | Ht 71.0 in | Wt 151.7 lb

## 2019-04-05 DIAGNOSIS — I739 Peripheral vascular disease, unspecified: Secondary | ICD-10-CM

## 2019-04-05 DIAGNOSIS — I70229 Atherosclerosis of native arteries of extremities with rest pain, unspecified extremity: Secondary | ICD-10-CM

## 2019-04-05 NOTE — H&P (View-Only) (Signed)
REASON FOR CONSULT:    Peripheral vascular disease with right leg pain.  The consult is requested by Dr.Rebollar  ASSESSMENT & PLAN:   PERIPHERAL VASCULAR DISEASE WITH REST PAIN: This patient has a patent aortofemoral bypass graft but severe infrainguinal arterial occlusive disease bilaterally.  This disease is more significant on the right side.  Even way back in 2016, when he had his arteriogram prior to his surgery he had no visualized vessels distally on the right in the thigh or leg.  His only runoff was the deep femoral artery.  Thus he was living off of collaterals.  This could clearly become a limb threatening situation given his rest pain with severe infrainguinal disease.  I think his only option for revascularization would be to proceed with arteriography to see if there are any potential distal targets for a bypass or a retrograde intervention.  Fortunately he quit smoking about a year ago.  I encouraged him to stay as active as possible.  We will need to stop his Eliquis 48 hours prior to the procedure so this will be scheduled for 04/21/2019.  I will make further recommendations pending these results.  Certainly if he is being considered for a bypass on the right he would need preoperative cardiac evaluation given his significant cardiac history.  He has a history of significant CHF, aortic insufficiency, and COPD.  [name], MD, FACS Beeper [phone] Office: [phone]   HPI:   Kyle [name] is a pleasant 68 y.o. male, who is referred with right leg pain.  This patient underwent an aortobifemoral bypass graft in February 2016.  He never returned for follow-up.  Of note he had a previous right femoropopliteal bypass in 2010.  The results of his arteriogram prior to his aortofemoral bypass graft are noted below.  I reviewed the records from the referring office.  Patient was seen on 03/08/2019 complaining of right leg pain which had been going on for a couple of weeks.   Sounds like he was having rest pain in the right foot.  On my history, the patient noted the gradual onset of rest pain in the right foot about 2 weeks ago.  He describes calf claudication on the right which began gradually about 3 weeks ago.  He states that he quit smoking a year ago.  Because of his congestive heart failure I suspect his activity is somewhat limited.   Past Medical History:  Diagnosis Date   Aortic insufficiency    MODERATE WITH A BICUSPID AORTIC VAVLE   Arterial occlusive disease    MULTILEVEL   CHF (congestive heart failure) (HCC)    COPD (chronic obstructive pulmonary disease) (HCC)    Dilated cardiomyopathy (HCC)    WITH EJECTION FRACTION DOWN TO 20-25%--WITH CONGESTIVE HEART FAILURE   Edema    LOWER EXTREMETIES   Hypertension    Normal coronary arteries 2009   Orthopnea    Peripheral arterial disease (HCC)    Pulmonary hypertension (HCC)    SOB (shortness of breath)     Family History  Problem Relation Age of Onset   Diabetes Mother    Hyperlipidemia Sister    Hypertension Sister     SOCIAL HISTORY: Social History   Socioeconomic History   Marital status: Married    Spouse name: Not on file   Number of children: 1   Years of education: Not on file   Highest education level: Not on file  Occupational History    Employer: [employer]  Social Designer, fashion/clothing strain: Not on file   Food insecurity:    Worry: Not on file    Inability: Not on file   Transportation needs:    Medical: Not on file    Non-medical: Not on file  Tobacco Use   Smoking status: Former Smoker    Packs/day: 1.00    Years: 40.00    Pack years: 40.00    Last attempt to quit: 10/15/2018    Years since quitting: 0.4   Smokeless tobacco: Never Used   Tobacco comment: quit 2010 and quit again 2019  Substance and Sexual Activity   Alcohol use: No    Alcohol/week: 0.0 standard drinks   Drug use: No   Sexual activity: Not on file    Lifestyle   Physical activity:    Days per week: Not on file    Minutes per session: Not on file   Stress: Not on file  Relationships   Social connections:    Talks on phone: Not on file    Gets together: Not on file    Attends religious service: Not on file    Active member of club or organization: Not on file    Attends meetings of clubs or organizations: Not on file    Relationship status: Not on file   Intimate partner violence:    Fear of current or ex partner: Not on file    Emotionally abused: Not on file    Physically abused: Not on file    Forced sexual activity: Not on file  Other Topics Concern   Not on file  Social History Narrative   Not on file    No Known Allergies  Current Outpatient Medications  Medication Sig Dispense Refill   ACCU-CHEK FASTCLIX LANCETS MISC      ACCU-CHEK GUIDE test strip      amLODipine (NORVASC) 5 MG tablet Take 1 tablet (5 mg total) by mouth daily. 90 tablet 3   ANORO ELLIPTA 62.5-25 MCG/INH AEPB INHALE 1 PUFF INTO THE LUNGS DAILY 60 each 0   apixaban (ELIQUIS) 5 MG TABS tablet Take 1 tablet (5 mg total) by mouth 2 (two) times daily. 60 tablet 5   BD PEN NEEDLE NANO U/F 32G X 4 MM MISC      carvedilol (COREG) 12.5 MG tablet TAKE 1 TABLET TWICE DAILY WITH MEALS 180 tablet 3   furosemide (LASIX) 40 MG tablet TAKE 1 TABLET (40 MG TOTAL) BY MOUTH DAILY. 90 tablet 6   guaiFENesin (MUCINEX) 600 MG 12 hr tablet Take 2 tablets (1,200 mg total) by mouth 2 (two) times daily. 30 tablet 0   insulin glargine (LANTUS) 100 UNIT/ML injection Inject 15 Units into the skin at bedtime.     metFORMIN (GLUCOPHAGE) 500 MG tablet Take by mouth 2 (two) times daily with a meal.     pravastatin (PRAVACHOL) 40 MG tablet TAKE 1 TABLET AT BEDTIME 90 tablet 3   quinapril (ACCUPRIL) 10 MG tablet TAKE 1 TABLET (10 MG TOTAL) DAILY. 90 tablet 3   No current facility-administered medications for this visit.     REVIEW OF SYSTEMS:  [X]  denotes  positive finding, [ ]  denotes negative finding Cardiac  Comments:  Chest pain or chest pressure:    Shortness of breath upon exertion:    Short of breath when lying flat:    Irregular heart rhythm:        Vascular    Pain in calf, thigh, or hip brought  on by ambulation:    Pain in feet at night that wakes you up from your sleep:  x   Blood clot in your veins:    Leg swelling:         Pulmonary    Oxygen at home:    Productive cough:     Wheezing:         Neurologic    Sudden weakness in arms or legs:     Sudden numbness in arms or legs:     Sudden onset of difficulty speaking or slurred speech:    Temporary loss of vision in one eye:     Problems with dizziness:         Gastrointestinal    Blood in stool:     Vomited blood:         Genitourinary    Burning when urinating:     Blood in urine:        Psychiatric    Major depression:         Hematologic    Bleeding problems:    Problems with blood clotting too easily:        Skin    Rashes or ulcers:        Constitutional    Fever or chills:     PHYSICAL EXAM:   Vitals:   04/05/19 1452  BP: 126/68  Pulse: 68  Resp: 20  Temp: 98.1 F (36.7 C)  SpO2: 96%  Weight: 151 lb 11.2 oz (68.8 kg)  Height: 5\' 11"  (1.803 m)    GENERAL: The patient is a well-nourished male, in no acute distress. The vital signs are documented above. CARDIAC: There is a regular rate and rhythm.  VASCULAR: I do not detect carotid bruits. He has normal femoral pulses. He has a monophasic dorsalis pedis and peroneal signal on the right.  He has a monophasic dorsalis pedis posterior tibial and peroneal signal on the left. His right foot is slightly darker than the left consistent with chronic ischemia. He has no significant lower extremity swelling. PULMONARY: There is good air exchange bilaterally without wheezing or rales. ABDOMEN: Soft and non-tender with normal pitched bowel sounds.  MUSCULOSKELETAL: There are no major deformities  or cyanosis. NEUROLOGIC: No focal weakness or paresthesias are detected. SKIN: There are no ulcers or rashes noted. PSYCHIATRIC: The patient has a normal affect.  DATA:    ARTERIAL DOPPLER STUDY: I have independently interpreted his arterial Doppler study today.  On the right side he has a monophasic anterior tibial signal with no posterior tibial signal.  He does have a peroneal signal which is monophasic.  ABI on the right is 28%.  Toe pressure is 37 mmHg.  On the left side he has a monophasic anterior tibial, posterior tibial, and peroneal signal.  ABI is 59%.  Toe pressure on the left is 43 mmHg.  ARTERIOGRAM: I did review his arteriogram from January 2016 prior to his aortofemoral bypass graft.  He had extensive multilevel disease bilaterally.  On the left side, the external iliac artery was occluded as was the common femoral artery.  There is reconstitution of the deep femoral artery.  The superficial femoral artery occluded at its origin with reconstitution of the above-knee popliteal artery.  There was single-vessel runoff on the left via the posterior tibial artery.  The anterior tibial artery was occluded.  Cath on the right side there was diffuse disease of the external iliac artery.  The common femoral and deep femoral  artery were patent.  The superficial femoral artery was occluded at its origin.  There was no patent vessel in the distal thigh or leg on the right.  ECHO: It looks like his most recent echo was on 11/21/2018.  At that time his ejection fraction was up to 40 to 45% which is a significant improvement compared to previous studies.  He did have diffuse hypokinesis.  Systolic function was mildly to moderately reduced.  He has mild aortic stenosis and moderate regurgitation of the aortic valve.

## 2019-04-05 NOTE — Progress Notes (Signed)
REASON FOR CONSULT:    Peripheral vascular disease with right leg pain.  The consult is requested by Dr.Rebollar  ASSESSMENT & PLAN:   PERIPHERAL VASCULAR DISEASE WITH REST PAIN: This patient has a patent aortofemoral bypass graft but severe infrainguinal arterial occlusive disease bilaterally.  This disease is more significant on the right side.  Even way back in 2016, when he had his arteriogram prior to his surgery he had no visualized vessels distally on the right in the thigh or leg.  His only runoff was the deep femoral artery.  Thus he was living off of collaterals.  This could clearly become a limb threatening situation given his rest pain with severe infrainguinal disease.  I think his only option for revascularization would be to proceed with arteriography to see if there are any potential distal targets for a bypass or a retrograde intervention.  Fortunately he quit smoking about a year ago.  I encouraged him to stay as active as possible.  We will need to stop his Eliquis 48 hours prior to the procedure so this will be scheduled for 04/21/2019.  I will make further recommendations pending these results.  Certainly if he is being considered for a bypass on the right he would need preoperative cardiac evaluation given his significant cardiac history.  He has a history of significant CHF, aortic insufficiency, and COPD.  [name], MD, FACS Beeper [phone] Office: [phone]   HPI:   Kyle [name] is a pleasant 68 y.o. male, who is referred with right leg pain.  This patient underwent an aortobifemoral bypass graft in February 2016.  He never returned for follow-up.  Of note he had a previous right femoropopliteal bypass in 2010.  The results of his arteriogram prior to his aortofemoral bypass graft are noted below.  I reviewed the records from the referring office.  Patient was seen on 03/08/2019 complaining of right leg pain which had been going on for a couple of weeks.   Sounds like he was having rest pain in the right foot.  On my history, the patient noted the gradual onset of rest pain in the right foot about 2 weeks ago.  He describes calf claudication on the right which began gradually about 3 weeks ago.  He states that he quit smoking a year ago.  Because of his congestive heart failure I suspect his activity is somewhat limited.   Past Medical History:  Diagnosis Date   Aortic insufficiency    MODERATE WITH A BICUSPID AORTIC VAVLE   Arterial occlusive disease    MULTILEVEL   CHF (congestive heart failure) (HCC)    COPD (chronic obstructive pulmonary disease) (HCC)    Dilated cardiomyopathy (HCC)    WITH EJECTION FRACTION DOWN TO 20-25%--WITH CONGESTIVE HEART FAILURE   Edema    LOWER EXTREMETIES   Hypertension    Normal coronary arteries 2009   Orthopnea    Peripheral arterial disease (HCC)    Pulmonary hypertension (HCC)    SOB (shortness of breath)     Family History  Problem Relation Age of Onset   Diabetes Mother    Hyperlipidemia Sister    Hypertension Sister     SOCIAL HISTORY: Social History   Socioeconomic History   Marital status: Married    Spouse name: Not on file   Number of children: 1   Years of education: Not on file   Highest education level: Not on file  Occupational History    Employer: [employer]  Social Designer, fashion/clothing strain: Not on file   Food insecurity:    Worry: Not on file    Inability: Not on file   Transportation needs:    Medical: Not on file    Non-medical: Not on file  Tobacco Use   Smoking status: Former Smoker    Packs/day: 1.00    Years: 40.00    Pack years: 40.00    Last attempt to quit: 10/15/2018    Years since quitting: 0.4   Smokeless tobacco: Never Used   Tobacco comment: quit 2010 and quit again 2019  Substance and Sexual Activity   Alcohol use: No    Alcohol/week: 0.0 standard drinks   Drug use: No   Sexual activity: Not on file    Lifestyle   Physical activity:    Days per week: Not on file    Minutes per session: Not on file   Stress: Not on file  Relationships   Social connections:    Talks on phone: Not on file    Gets together: Not on file    Attends religious service: Not on file    Active member of club or organization: Not on file    Attends meetings of clubs or organizations: Not on file    Relationship status: Not on file   Intimate partner violence:    Fear of current or ex partner: Not on file    Emotionally abused: Not on file    Physically abused: Not on file    Forced sexual activity: Not on file  Other Topics Concern   Not on file  Social History Narrative   Not on file    No Known Allergies  Current Outpatient Medications  Medication Sig Dispense Refill   ACCU-CHEK FASTCLIX LANCETS MISC      ACCU-CHEK GUIDE test strip      amLODipine (NORVASC) 5 MG tablet Take 1 tablet (5 mg total) by mouth daily. 90 tablet 3   ANORO ELLIPTA 62.5-25 MCG/INH AEPB INHALE 1 PUFF INTO THE LUNGS DAILY 60 each 0   apixaban (ELIQUIS) 5 MG TABS tablet Take 1 tablet (5 mg total) by mouth 2 (two) times daily. 60 tablet 5   BD PEN NEEDLE NANO U/F 32G X 4 MM MISC      carvedilol (COREG) 12.5 MG tablet TAKE 1 TABLET TWICE DAILY WITH MEALS 180 tablet 3   furosemide (LASIX) 40 MG tablet TAKE 1 TABLET (40 MG TOTAL) BY MOUTH DAILY. 90 tablet 6   guaiFENesin (MUCINEX) 600 MG 12 hr tablet Take 2 tablets (1,200 mg total) by mouth 2 (two) times daily. 30 tablet 0   insulin glargine (LANTUS) 100 UNIT/ML injection Inject 15 Units into the skin at bedtime.     metFORMIN (GLUCOPHAGE) 500 MG tablet Take by mouth 2 (two) times daily with a meal.     pravastatin (PRAVACHOL) 40 MG tablet TAKE 1 TABLET AT BEDTIME 90 tablet 3   quinapril (ACCUPRIL) 10 MG tablet TAKE 1 TABLET (10 MG TOTAL) DAILY. 90 tablet 3   No current facility-administered medications for this visit.     REVIEW OF SYSTEMS:  [X]  denotes  positive finding, [ ]  denotes negative finding Cardiac  Comments:  Chest pain or chest pressure:    Shortness of breath upon exertion:    Short of breath when lying flat:    Irregular heart rhythm:        Vascular    Pain in calf, thigh, or hip brought  on by ambulation:    Pain in feet at night that wakes you up from your sleep:  x   Blood clot in your veins:    Leg swelling:         Pulmonary    Oxygen at home:    Productive cough:     Wheezing:         Neurologic    Sudden weakness in arms or legs:     Sudden numbness in arms or legs:     Sudden onset of difficulty speaking or slurred speech:    Temporary loss of vision in one eye:     Problems with dizziness:         Gastrointestinal    Blood in stool:     Vomited blood:         Genitourinary    Burning when urinating:     Blood in urine:        Psychiatric    Major depression:         Hematologic    Bleeding problems:    Problems with blood clotting too easily:        Skin    Rashes or ulcers:        Constitutional    Fever or chills:     PHYSICAL EXAM:   Vitals:   04/05/19 1452  BP: 126/68  Pulse: 68  Resp: 20  Temp: 98.1 F (36.7 C)  SpO2: 96%  Weight: 151 lb 11.2 oz (68.8 kg)  Height: 5\' 11"  (1.803 m)    GENERAL: The patient is a well-nourished male, in no acute distress. The vital signs are documented above. CARDIAC: There is a regular rate and rhythm.  VASCULAR: I do not detect carotid bruits. He has normal femoral pulses. He has a monophasic dorsalis pedis and peroneal signal on the right.  He has a monophasic dorsalis pedis posterior tibial and peroneal signal on the left. His right foot is slightly darker than the left consistent with chronic ischemia. He has no significant lower extremity swelling. PULMONARY: There is good air exchange bilaterally without wheezing or rales. ABDOMEN: Soft and non-tender with normal pitched bowel sounds.  MUSCULOSKELETAL: There are no major deformities  or cyanosis. NEUROLOGIC: No focal weakness or paresthesias are detected. SKIN: There are no ulcers or rashes noted. PSYCHIATRIC: The patient has a normal affect.  DATA:    ARTERIAL DOPPLER STUDY: I have independently interpreted his arterial Doppler study today.  On the right side he has a monophasic anterior tibial signal with no posterior tibial signal.  He does have a peroneal signal which is monophasic.  ABI on the right is 28%.  Toe pressure is 37 mmHg.  On the left side he has a monophasic anterior tibial, posterior tibial, and peroneal signal.  ABI is 59%.  Toe pressure on the left is 43 mmHg.  ARTERIOGRAM: I did review his arteriogram from January 2016 prior to his aortofemoral bypass graft.  He had extensive multilevel disease bilaterally.  On the left side, the external iliac artery was occluded as was the common femoral artery.  There is reconstitution of the deep femoral artery.  The superficial femoral artery occluded at its origin with reconstitution of the above-knee popliteal artery.  There was single-vessel runoff on the left via the posterior tibial artery.  The anterior tibial artery was occluded.  Cath on the right side there was diffuse disease of the external iliac artery.  The common femoral and deep femoral  artery were patent.  The superficial femoral artery was occluded at its origin.  There was no patent vessel in the distal thigh or leg on the right.  ECHO: It looks like his most recent echo was on 11/21/2018.  At that time his ejection fraction was up to 40 to 45% which is a significant improvement compared to previous studies.  He did have diffuse hypokinesis.  Systolic function was mildly to moderately reduced.  He has mild aortic stenosis and moderate regurgitation of the aortic valve.

## 2019-04-06 ENCOUNTER — Encounter: Payer: Self-pay | Admitting: *Deleted

## 2019-04-17 DIAGNOSIS — M21961 Unspecified acquired deformity of right lower leg: Secondary | ICD-10-CM | POA: Insufficient documentation

## 2019-04-18 ENCOUNTER — Other Ambulatory Visit: Payer: Self-pay | Admitting: *Deleted

## 2019-04-18 ENCOUNTER — Other Ambulatory Visit (HOSPITAL_COMMUNITY): Payer: Medicare HMO

## 2019-04-18 NOTE — Progress Notes (Signed)
Message left with [name] office nurse. Mr Ratajczak did not show up for Covid 19 screening today. His procedure is scheduled for 6/26 at [facility] lab.

## 2019-04-18 NOTE — Progress Notes (Signed)
Left voicemail to inquire arrival today for Covid 19 screen prior to procedure 6/26

## 2019-04-19 ENCOUNTER — Other Ambulatory Visit (HOSPITAL_COMMUNITY)
Admission: RE | Admit: 2019-04-19 | Discharge: 2019-04-19 | Disposition: A | Discharge status: Home / Self Care | Payer: Medicare HMO | Source: Ambulatory Visit | Attending: Vascular Surgery | Admitting: Vascular Surgery

## 2019-04-19 DIAGNOSIS — Z1159 Encounter for screening for other viral diseases: Secondary | ICD-10-CM | POA: Diagnosis present

## 2019-04-19 LAB — SARS CORONAVIRUS 2 (TAT 6-24 HRS): SARS Coronavirus 2: NEGATIVE

## 2019-04-21 ENCOUNTER — Encounter (HOSPITAL_COMMUNITY): Payer: Self-pay | Admitting: Vascular Surgery

## 2019-04-21 ENCOUNTER — Encounter (HOSPITAL_COMMUNITY)
Admission: RE | Disposition: A | Discharge status: Home / Self Care | Payer: Self-pay | Source: Home / Self Care | Attending: Vascular Surgery

## 2019-04-21 ENCOUNTER — Ambulatory Visit (HOSPITAL_COMMUNITY)
Admission: RE | Admit: 2019-04-21 | Discharge: 2019-04-21 | Disposition: A | Discharge status: Home / Self Care | Payer: Medicare HMO | Source: Home / Self Care | Attending: Vascular Surgery | Admitting: Vascular Surgery

## 2019-04-21 ENCOUNTER — Other Ambulatory Visit: Payer: Self-pay

## 2019-04-21 DIAGNOSIS — I70229 Atherosclerosis of native arteries of extremities with rest pain, unspecified extremity: Secondary | ICD-10-CM

## 2019-04-21 DIAGNOSIS — Z8249 Family history of ischemic heart disease and other diseases of the circulatory system: Secondary | ICD-10-CM | POA: Insufficient documentation

## 2019-04-21 DIAGNOSIS — I420 Dilated cardiomyopathy: Secondary | ICD-10-CM | POA: Insufficient documentation

## 2019-04-21 DIAGNOSIS — I110 Hypertensive heart disease with heart failure: Secondary | ICD-10-CM | POA: Insufficient documentation

## 2019-04-21 DIAGNOSIS — M79604 Pain in right leg: Secondary | ICD-10-CM | POA: Insufficient documentation

## 2019-04-21 DIAGNOSIS — I739 Peripheral vascular disease, unspecified: Secondary | ICD-10-CM | POA: Insufficient documentation

## 2019-04-21 DIAGNOSIS — I272 Pulmonary hypertension, unspecified: Secondary | ICD-10-CM | POA: Insufficient documentation

## 2019-04-21 DIAGNOSIS — I509 Heart failure, unspecified: Secondary | ICD-10-CM | POA: Insufficient documentation

## 2019-04-21 DIAGNOSIS — I724 Aneurysm of artery of lower extremity: Secondary | ICD-10-CM | POA: Insufficient documentation

## 2019-04-21 DIAGNOSIS — Z794 Long term (current) use of insulin: Secondary | ICD-10-CM | POA: Insufficient documentation

## 2019-04-21 DIAGNOSIS — E1151 Type 2 diabetes mellitus with diabetic peripheral angiopathy without gangrene: Secondary | ICD-10-CM | POA: Diagnosis not present

## 2019-04-21 DIAGNOSIS — I70221 Atherosclerosis of native arteries of extremities with rest pain, right leg: Secondary | ICD-10-CM | POA: Diagnosis not present

## 2019-04-21 DIAGNOSIS — Z79899 Other long term (current) drug therapy: Secondary | ICD-10-CM | POA: Insufficient documentation

## 2019-04-21 DIAGNOSIS — Z87891 Personal history of nicotine dependence: Secondary | ICD-10-CM | POA: Insufficient documentation

## 2019-04-21 DIAGNOSIS — Z7901 Long term (current) use of anticoagulants: Secondary | ICD-10-CM | POA: Insufficient documentation

## 2019-04-21 DIAGNOSIS — J449 Chronic obstructive pulmonary disease, unspecified: Secondary | ICD-10-CM | POA: Insufficient documentation

## 2019-04-21 DIAGNOSIS — I351 Nonrheumatic aortic (valve) insufficiency: Secondary | ICD-10-CM | POA: Insufficient documentation

## 2019-04-21 HISTORY — PX: ABDOMINAL AORTOGRAM W/LOWER EXTREMITY: CATH118223

## 2019-04-21 HISTORY — DX: Atherosclerosis of native arteries of extremities with rest pain, unspecified extremity: I70.229

## 2019-04-21 LAB — POCT I-STAT, CHEM 8
BUN: 32 mg/dL — ABNORMAL HIGH (ref 8–23)
Calcium, Ion: 1.12 mmol/L — ABNORMAL LOW (ref 1.15–1.40)
Chloride: 109 mmol/L (ref 98–111)
Creatinine, Ser: 1.4 mg/dL — ABNORMAL HIGH (ref 0.61–1.24)
Glucose, Bld: 120 mg/dL — ABNORMAL HIGH (ref 70–99)
HCT: 52 % (ref 39.0–52.0)
Hemoglobin: 17.7 g/dL — ABNORMAL HIGH (ref 13.0–17.0)
Potassium: 4.7 mmol/L (ref 3.5–5.1)
Sodium: 139 mmol/L (ref 135–145)
TCO2: 25 mmol/L (ref 22–32)

## 2019-04-21 LAB — GLUCOSE, CAPILLARY: Glucose-Capillary: 97 mg/dL (ref 70–99)

## 2019-04-21 SURGERY — ABDOMINAL AORTOGRAM W/LOWER EXTREMITY
Anesthesia: LOCAL | Laterality: Bilateral

## 2019-04-21 MED ORDER — LIDOCAINE HCL (PF) 1 % IJ SOLN
INTRAMUSCULAR | Status: AC
  Filled 2019-04-21: qty 30

## 2019-04-21 MED ORDER — SODIUM CHLORIDE 0.9 % IV SOLN: 250.0000 mL | INTRAVENOUS | Status: DC | PRN

## 2019-04-21 MED ORDER — FENTANYL CITRATE (PF) 100 MCG/2ML IJ SOLN
INTRAMUSCULAR | Status: AC
  Filled 2019-04-21: qty 2

## 2019-04-21 MED ORDER — LIDOCAINE HCL (PF) 1 % IJ SOLN
INTRAMUSCULAR | Status: DC | PRN
  Administered 2019-04-21: 15 mL

## 2019-04-21 MED ORDER — HYDRALAZINE HCL 20 MG/ML IJ SOLN
5.0000 mg | INTRAMUSCULAR | Status: DC | PRN
  Administered 2019-04-21: 5 mg via INTRAVENOUS

## 2019-04-21 MED ORDER — HYDRALAZINE HCL 20 MG/ML IJ SOLN
INTRAMUSCULAR | Status: AC
  Filled 2019-04-21: qty 1

## 2019-04-21 MED ORDER — HYDRALAZINE HCL 20 MG/ML IJ SOLN
INTRAMUSCULAR | Status: DC | PRN
  Administered 2019-04-21: 10 mg via INTRAVENOUS

## 2019-04-21 MED ORDER — SODIUM CHLORIDE 0.9% FLUSH: 3.0000 mL | INTRAVENOUS | Status: DC | PRN

## 2019-04-21 MED ORDER — FENTANYL CITRATE (PF) 100 MCG/2ML IJ SOLN
INTRAMUSCULAR | Status: DC | PRN
  Administered 2019-04-21: 50 ug via INTRAVENOUS

## 2019-04-21 MED ORDER — HEPARIN (PORCINE) IN NACL 1000-0.9 UT/500ML-% IV SOLN
INTRAVENOUS | Status: DC | PRN
  Administered 2019-04-21 (×2): 500 mL

## 2019-04-21 MED ORDER — MIDAZOLAM HCL 2 MG/2ML IJ SOLN
INTRAMUSCULAR | Status: DC | PRN
  Administered 2019-04-21: 1 mg via INTRAVENOUS

## 2019-04-21 MED ORDER — HEPARIN (PORCINE) IN NACL 1000-0.9 UT/500ML-% IV SOLN
INTRAVENOUS | Status: AC
  Filled 2019-04-21: qty 500

## 2019-04-21 MED ORDER — ACETAMINOPHEN 325 MG PO TABS: 650.0000 mg | ORAL_TABLET | ORAL | Status: DC | PRN

## 2019-04-21 MED ORDER — SODIUM CHLORIDE 0.9% FLUSH: 3.0000 mL | Freq: Two times a day (BID) | INTRAVENOUS | Status: DC

## 2019-04-21 MED ORDER — SODIUM CHLORIDE 0.9 % IV SOLN
INTRAVENOUS | Status: DC
  Administered 2019-04-21: 07:00:00 via INTRAVENOUS

## 2019-04-21 MED ORDER — MIDAZOLAM HCL 2 MG/2ML IJ SOLN
INTRAMUSCULAR | Status: AC
  Filled 2019-04-21: qty 2

## 2019-04-21 MED ORDER — ONDANSETRON HCL 4 MG/2ML IJ SOLN: 4.0000 mg | Freq: Four times a day (QID) | INTRAMUSCULAR | Status: DC | PRN

## 2019-04-21 MED ORDER — SODIUM CHLORIDE 0.9 % WEIGHT BASED INFUSION: 1.0000 mL/kg/h | INTRAVENOUS | Status: DC

## 2019-04-21 MED ORDER — IODIXANOL 320 MG/ML IV SOLN
INTRAVENOUS | Status: DC | PRN
  Administered 2019-04-21: 147 mL via INTRA_ARTERIAL

## 2019-04-21 MED ORDER — LABETALOL HCL 5 MG/ML IV SOLN
10.0000 mg | INTRAVENOUS | Status: DC | PRN
  Administered 2019-04-21: 10 mg via INTRAVENOUS
  Filled 2019-04-21: qty 4

## 2019-04-21 SURGICAL SUPPLY — 11 items
CATH ANGIO 5F PIGTAIL 65CM (CATHETERS) ×1 IMPLANT
KIT MICROPUNCTURE NIT STIFF (SHEATH) ×1 IMPLANT
KIT PV (KITS) ×2 IMPLANT
SHEATH PINNACLE 5F 10CM (SHEATH) ×1 IMPLANT
SHEATH PROBE COVER 6X72 (BAG) ×1 IMPLANT
STOPCOCK MORSE 400PSI 3WAY (MISCELLANEOUS) ×1 IMPLANT
SYR MEDRAD MARK 7 150ML (SYRINGE) ×2 IMPLANT
TRANSDUCER W/STOPCOCK (MISCELLANEOUS) ×2 IMPLANT
TRAY PV CATH (CUSTOM PROCEDURE TRAY) ×2 IMPLANT
TUBING CIL FLEX 10 FLL-RA (TUBING) ×1 IMPLANT
WIRE HITORQ VERSACORE ST 145CM (WIRE) ×1 IMPLANT

## 2019-04-21 NOTE — Interval H&P Note (Signed)
History and Physical Interval Note:  04/21/2019 7:32 AM  Kyle [name]  has presented today for surgery, with the diagnosis of PVD.  The various methods of treatment have been discussed with the patient and family. After consideration of risks, benefits and other options for treatment, the patient has consented to  Procedure(s): ABDOMINAL AORTOGRAM W/LOWER EXTREMITY (Bilateral) as a surgical intervention.  The patient's history has been reviewed, patient examined, no change in status, stable for surgery.  I have reviewed the patient's chart and labs.  Questions were answered to the patient's satisfaction.     [name]

## 2019-04-21 NOTE — Interval H&P Note (Signed)
History and Physical Interval Note:  04/21/2019 7:30 AM  Kyle [name]  has presented today for surgery, with the diagnosis of PVD.  The various methods of treatment have been discussed with the patient and family. After consideration of risks, benefits and other options for treatment, the patient has consented to  Procedure(s): ABDOMINAL AORTOGRAM W/LOWER EXTREMITY (Bilateral) as a surgical intervention.  The patient's history has been reviewed, patient examined, no change in status, stable for surgery.  I have reviewed the patient's chart and labs.  Questions were answered to the patient's satisfaction.     [name]

## 2019-04-21 NOTE — Op Note (Signed)
PATIENT: Kyle [name]      MRN: [id number] DOB: 11/06/1950    DATE OF PROCEDURE: 04/21/2019  INDICATIONS:    IOANNIS SCHUH is a 68 y.o. male who underwent an aortofemoral bypass graft in 2016.  He has severe infrainguinal arterial occlusive disease.  He had a previous right femoral to popliteal artery bypass with vein in 2010 which is chronically occluded.  He presented with rest pain of the right foot.  PROCEDURE:    1.  Conscious sedation 2.  Ultrasound-guided access to the right limb of his aortofemoral bypass graft 3.  Aortogram with bilateral iliac arteriogram and bilateral lower extremity 4.  Retrograde right femoral arteriogram with right lower extremity runoff  SURGEON: [name]. Scot Dock, MD, FACS  ANESTHESIA: Local with sedation  EBL: Minimal  TECHNIQUE: The patient was taken to the peripheral vascular lab and was sedated.  The period of conscious sedation was 49 minutes.  During that time period, I was present face-to-face 100% of the time.  The patient was administered 1 mg of Versed and 50 mcg of fentanyl. The patient's heart rate, blood pressure, and oxygen saturation were monitored by the nurse continuously during the procedure.  Both groins were prepped and draped in the usual sterile fashion.  Under ultrasound guidance, after the skin was anesthetized, I cannulated the right limb of the aortofemoral bypass graft.  Of note we had technical problems with the ultrasound machine and were unable to save the image.  I used a micropuncture needle and a micropuncture sheath was introduced over a wire.  I exchanged this for a 5 French sheath over a Bentson wire.  The pigtail catheter was then positioned at the L1 vertebral body and flush aortogram obtained.  The catheter was then positioned above the bifurcation of the graft and bilateral lower extremity runoff films were obtained.  Next the pigtail catheter was removed over a wire.  A retrograde right femoral arteriogram was  obtained with spot films of the right leg and a lateral projection of the foot.  Patient was then transferred to the holding area for removal of the sheath.  No immediate complications were noted.  FINDINGS:   1.  There are single renal arteries bilaterally with no significant renal artery stenosis identified. 2.  The aortobifemoral bypass graft is patent. 3.  On the right side, which is the side of concern, there is some dilatation of the anastomosis consistent with a pseudoaneurysm.  The deep femoral artery is patent.  The superficial femoral artery is occluded at its origin.  The superficial femoral artery, popliteal artery, anterior tibial artery, posterior tibial artery, tibial peroneal trunk, and proximal peroneal arteries are occluded.  There is reconstitution of the peroneal artery in the mid leg on the right and then collateralization of the dorsalis pedis in the foot. 4.  On the left side, the common femoral and deep femoral artery are patent.  The superficial femoral artery is occluded at its origin.  There is reconstitution of the distal posterior tibial artery only on the left.  CLINICAL NOTE: This patient has had vein taken from the right leg in 2010 for a femoropopliteal which is chronically occluded.  He will have to come in for vein mapping of both lower extremities to see if he has adequate vein for a femoral peroneal bypass on the right.  He now has developed a wound on the right foot.  I have attached a picture below.  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  In addition he has a right femoral artery pseudoaneurysm which will be addressed at the same time.  Tentatively we will try to get him on the schedule for 05/16/2019.      [name], MD, FACS [facility]  DATE OF DICTATION:   04/21/2019

## 2019-04-21 NOTE — Progress Notes (Signed)
Site area: Right groin a 5 french arterial sheath was removed  Site Prior to Removal:  Level 0  Pressure Applied For 20 MINUTES    Bedrest beginning at 0950am  Manual:   Yes.    Patient Status During Pull:  stable  Post Pull Groin Site:  Level 0  Post Pull Instructions Given:  Yes.    Post Pull Pulses Present:  Yes.    Dressing Applied:  Yes.    Comments:  VS remain stable

## 2019-04-21 NOTE — Discharge Instructions (Signed)
Femoral Site Care °This sheet gives you information about how to care for yourself after your procedure. Your health care provider may also give you more specific instructions. If you have problems or questions, contact your health care provider. °What can I expect after the procedure? °After the procedure, it is common to have: °· Bruising that usually fades within 1-2 weeks. °· Tenderness at the site. °Follow these instructions at home: °Wound care °· Follow instructions from your health care provider about how to take care of your insertion site. Make sure you: °? Wash your hands with soap and water before you change your bandage (dressing). If soap and water are not available, use hand sanitizer. °? Remove your dressing as told by your health care provider. In 24-48 hours °? Leave stitches (sutures), skin glue, or adhesive strips in place. These skin closures may need to stay in place for 2 weeks or longer. If adhesive strip edges start to loosen and curl up, you may trim the loose edges. Do not remove adhesive strips completely unless your health care provider tells you to do that. °· Do not take baths, swim, or use a hot tub until your health care provider approves. °· You may shower 24-48 hours after the procedure or as told by your health care provider. °? Gently wash the site with plain soap and water. °? Pat the area dry with a clean towel. °? Do not rub the site. This may cause bleeding. °· Do not apply powder or lotion to the site. Keep the site clean and dry. °· Check your femoral site every day for signs of infection. Check for: °? Redness, swelling, or pain. °? Fluid or blood. °? Warmth. °? Pus or a bad smell. °Activity °· For the first 2-3 days after your procedure, or as long as directed: °? Avoid climbing stairs as much as possible. °? Do not squat. °· Do not lift anything that is heavier than 10 lb (4.5 kg), or the limit that you are told, until your health care provider says that it is safe. For  5 days °· Rest as directed. °? Avoid sitting for a long time without moving. Get up to take short walks every 1-2 hours. °· Do not drive for 24 hours if you were given a medicine to help you relax (sedative). °General instructions °· Take over-the-counter and prescription medicines only as told by your health care provider. °· Keep all follow-up visits as told by your health care provider. This is important. °Contact a health care provider if you have: °· A fever or chills. °· You have redness, swelling, or pain around your insertion site. °Get help right away if: °· The catheter insertion area swells very fast. °· You pass out. °· You suddenly start to sweat or your skin gets clammy. °· The catheter insertion area is bleeding, and the bleeding does not stop when you hold steady pressure on the area. °· The area near or just beyond the catheter insertion site becomes pale, cool, tingly, or numb. °These symptoms may represent a serious problem that is an emergency. Do not wait to see if the symptoms will go away. Get medical help right away. Call your local emergency services (911 in the [place] Do not drive yourself to the hospital. °Summary °· After the procedure, it is common to have bruising that usually fades within 1-2 weeks. °· Check your femoral site every day for signs of infection. °· Do not lift anything that is heavier than   10 lb (4.5 kg), or the limit that you are told, until your health care provider says that it is safe. °This information is not intended to replace advice given to you by your health care provider. Make sure you discuss any questions you have with your health care provider. °Document Released: 06/15/2014 Document Revised: 10/25/2017 Document Reviewed: 10/25/2017 °Elsevier Interactive Patient Education © 2019 [employer] ° °

## 2019-04-22 ENCOUNTER — Inpatient Hospital Stay (HOSPITAL_COMMUNITY): Payer: Medicare HMO

## 2019-04-22 ENCOUNTER — Encounter (HOSPITAL_COMMUNITY): Payer: Self-pay

## 2019-04-22 ENCOUNTER — Inpatient Hospital Stay (HOSPITAL_COMMUNITY)
Admission: EM | Admit: 2019-04-22 | Discharge: 2019-04-26 | DRG: 253 | Disposition: A | Discharge status: Home / Self Care | Payer: Medicare HMO | Attending: Vascular Surgery | Admitting: Vascular Surgery

## 2019-04-22 ENCOUNTER — Other Ambulatory Visit: Payer: Self-pay

## 2019-04-22 DIAGNOSIS — N170 Acute kidney failure with tubular necrosis: Secondary | ICD-10-CM

## 2019-04-22 DIAGNOSIS — Z87891 Personal history of nicotine dependence: Secondary | ICD-10-CM | POA: Diagnosis not present

## 2019-04-22 DIAGNOSIS — Z7901 Long term (current) use of anticoagulants: Secondary | ICD-10-CM

## 2019-04-22 DIAGNOSIS — Z79899 Other long term (current) drug therapy: Secondary | ICD-10-CM | POA: Diagnosis not present

## 2019-04-22 DIAGNOSIS — N179 Acute kidney failure, unspecified: Secondary | ICD-10-CM | POA: Diagnosis present

## 2019-04-22 DIAGNOSIS — L97519 Non-pressure chronic ulcer of other part of right foot with unspecified severity: Secondary | ICD-10-CM | POA: Diagnosis present

## 2019-04-22 DIAGNOSIS — I724 Aneurysm of artery of lower extremity: Secondary | ICD-10-CM | POA: Diagnosis present

## 2019-04-22 DIAGNOSIS — Z01810 Encounter for preprocedural cardiovascular examination: Secondary | ICD-10-CM

## 2019-04-22 DIAGNOSIS — T82898A Other specified complication of vascular prosthetic devices, implants and grafts, initial encounter: Secondary | ICD-10-CM | POA: Diagnosis not present

## 2019-04-22 DIAGNOSIS — E11621 Type 2 diabetes mellitus with foot ulcer: Secondary | ICD-10-CM | POA: Diagnosis present

## 2019-04-22 DIAGNOSIS — I998 Other disorder of circulatory system: Secondary | ICD-10-CM | POA: Diagnosis present

## 2019-04-22 DIAGNOSIS — Z9981 Dependence on supplemental oxygen: Secondary | ICD-10-CM | POA: Diagnosis not present

## 2019-04-22 DIAGNOSIS — I420 Dilated cardiomyopathy: Secondary | ICD-10-CM | POA: Diagnosis present

## 2019-04-22 DIAGNOSIS — I4891 Unspecified atrial fibrillation: Secondary | ICD-10-CM | POA: Diagnosis present

## 2019-04-22 DIAGNOSIS — J449 Chronic obstructive pulmonary disease, unspecified: Secondary | ICD-10-CM | POA: Diagnosis present

## 2019-04-22 DIAGNOSIS — I272 Pulmonary hypertension, unspecified: Secondary | ICD-10-CM | POA: Diagnosis present

## 2019-04-22 DIAGNOSIS — I428 Other cardiomyopathies: Secondary | ICD-10-CM

## 2019-04-22 DIAGNOSIS — M79671 Pain in right foot: Secondary | ICD-10-CM

## 2019-04-22 DIAGNOSIS — E1151 Type 2 diabetes mellitus with diabetic peripheral angiopathy without gangrene: Secondary | ICD-10-CM | POA: Diagnosis present

## 2019-04-22 DIAGNOSIS — E782 Mixed hyperlipidemia: Secondary | ICD-10-CM

## 2019-04-22 DIAGNOSIS — E785 Hyperlipidemia, unspecified: Secondary | ICD-10-CM | POA: Diagnosis present

## 2019-04-22 DIAGNOSIS — I480 Paroxysmal atrial fibrillation: Secondary | ICD-10-CM

## 2019-04-22 DIAGNOSIS — I739 Peripheral vascular disease, unspecified: Secondary | ICD-10-CM | POA: Diagnosis present

## 2019-04-22 DIAGNOSIS — I110 Hypertensive heart disease with heart failure: Secondary | ICD-10-CM | POA: Diagnosis present

## 2019-04-22 DIAGNOSIS — Z833 Family history of diabetes mellitus: Secondary | ICD-10-CM | POA: Diagnosis not present

## 2019-04-22 DIAGNOSIS — I5022 Chronic systolic (congestive) heart failure: Secondary | ICD-10-CM | POA: Diagnosis present

## 2019-04-22 DIAGNOSIS — Z794 Long term (current) use of insulin: Secondary | ICD-10-CM | POA: Diagnosis not present

## 2019-04-22 LAB — CBC
HCT: 53.7 % — ABNORMAL HIGH (ref 39.0–52.0)
Hemoglobin: 17.6 g/dL — ABNORMAL HIGH (ref 13.0–17.0)
MCH: 30.4 pg (ref 26.0–34.0)
MCHC: 32.8 g/dL (ref 30.0–36.0)
MCV: 92.9 fL (ref 80.0–100.0)
Platelets: 181 10*3/uL (ref 150–400)
RBC: 5.78 MIL/uL (ref 4.22–5.81)
RDW: 15.8 % — ABNORMAL HIGH (ref 11.5–15.5)
WBC: 7.4 10*3/uL (ref 4.0–10.5)
nRBC: 0 % (ref 0.0–0.2)

## 2019-04-22 LAB — URINALYSIS, ROUTINE W REFLEX MICROSCOPIC
Bacteria, UA: NONE SEEN
Bilirubin Urine: NEGATIVE
Glucose, UA: NEGATIVE mg/dL
Hgb urine dipstick: NEGATIVE
Ketones, ur: NEGATIVE mg/dL
Leukocytes,Ua: NEGATIVE
Nitrite: NEGATIVE
Protein, ur: 30 mg/dL — AB
Specific Gravity, Urine: 1.017 (ref 1.005–1.030)
pH: 6 (ref 5.0–8.0)

## 2019-04-22 LAB — GLUCOSE, CAPILLARY: Glucose-Capillary: 190 mg/dL — ABNORMAL HIGH (ref 70–99)

## 2019-04-22 LAB — HEPARIN LEVEL (UNFRACTIONATED): Heparin Unfractionated: 0.22 IU/mL — ABNORMAL LOW (ref 0.30–0.70)

## 2019-04-22 LAB — LACTIC ACID, PLASMA: Lactic Acid, Venous: 1.1 mmol/L (ref 0.5–1.9)

## 2019-04-22 LAB — APTT: aPTT: 87 seconds — ABNORMAL HIGH (ref 24–36)

## 2019-04-22 MED ORDER — PANTOPRAZOLE SODIUM 40 MG PO TBEC
40.0000 mg | DELAYED_RELEASE_TABLET | Freq: Every day | ORAL | Status: DC
  Administered 2019-04-22 – 2019-04-26 (×5): 40 mg via ORAL
  Filled 2019-04-22 (×4): qty 1

## 2019-04-22 MED ORDER — POTASSIUM CHLORIDE CRYS ER 20 MEQ PO TBCR: 20.0000 meq | EXTENDED_RELEASE_TABLET | Freq: Once | ORAL | Status: DC

## 2019-04-22 MED ORDER — OXYCODONE-ACETAMINOPHEN 5-325 MG PO TABS
1.0000 | ORAL_TABLET | ORAL | Status: DC | PRN
  Administered 2019-04-22 – 2019-04-26 (×12): 2 via ORAL
  Filled 2019-04-22 (×12): qty 2

## 2019-04-22 MED ORDER — CARVEDILOL 12.5 MG PO TABS
12.5000 mg | ORAL_TABLET | Freq: Two times a day (BID) | ORAL | Status: DC
  Administered 2019-04-22 – 2019-04-26 (×9): 12.5 mg via ORAL
  Filled 2019-04-22 (×9): qty 1

## 2019-04-22 MED ORDER — ACETAMINOPHEN 325 MG PO TABS: 325.0000 mg | ORAL_TABLET | ORAL | Status: DC | PRN

## 2019-04-22 MED ORDER — AMLODIPINE BESYLATE 5 MG PO TABS
5.0000 mg | ORAL_TABLET | Freq: Every day | ORAL | Status: DC
  Administered 2019-04-22: 5 mg via ORAL
  Filled 2019-04-22: qty 1

## 2019-04-22 MED ORDER — AMLODIPINE BESYLATE 5 MG PO TABS
5.0000 mg | ORAL_TABLET | Freq: Once | ORAL | Status: AC
  Administered 2019-04-22: 5 mg via ORAL
  Filled 2019-04-22: qty 1

## 2019-04-22 MED ORDER — UMECLIDINIUM-VILANTEROL 62.5-25 MCG/INH IN AEPB: 1.0000 | INHALATION_SPRAY | Freq: Every day | RESPIRATORY_TRACT | Status: DC

## 2019-04-22 MED ORDER — BISACODYL 10 MG RE SUPP: 10.0000 mg | Freq: Every day | RECTAL | Status: DC | PRN

## 2019-04-22 MED ORDER — SODIUM CHLORIDE 0.9 % IV SOLN
INTRAVENOUS | Status: DC
  Administered 2019-04-22: 15:00:00 via INTRAVENOUS

## 2019-04-22 MED ORDER — MORPHINE SULFATE (PF) 2 MG/ML IV SOLN
2.0000 mg | INTRAVENOUS | Status: DC | PRN
  Administered 2019-04-23 – 2019-04-24 (×3): 2 mg via INTRAVENOUS
  Filled 2019-04-22 (×3): qty 1

## 2019-04-22 MED ORDER — ALUM & MAG HYDROXIDE-SIMETH 200-200-20 MG/5ML PO SUSP: 15.0000 mL | ORAL | Status: DC | PRN

## 2019-04-22 MED ORDER — HYDRALAZINE HCL 20 MG/ML IJ SOLN
5.0000 mg | INTRAMUSCULAR | Status: DC | PRN
  Administered 2019-04-24: 5 mg via INTRAVENOUS

## 2019-04-22 MED ORDER — POLYETHYLENE GLYCOL 3350 17 G PO PACK: 17.0000 g | PACK | Freq: Every day | ORAL | Status: DC | PRN

## 2019-04-22 MED ORDER — HEPARIN (PORCINE) 25000 UT/250ML-% IV SOLN
1200.0000 [IU]/h | INTRAVENOUS | Status: DC
  Administered 2019-04-22: 1000 [IU]/h via INTRAVENOUS
  Administered 2019-04-23: 1200 [IU]/h via INTRAVENOUS
  Filled 2019-04-22 (×2): qty 250

## 2019-04-22 MED ORDER — ISOSORBIDE MONONITRATE ER 30 MG PO TB24
30.0000 mg | ORAL_TABLET | Freq: Every day | ORAL | Status: DC
  Administered 2019-04-22 – 2019-04-26 (×4): 30 mg via ORAL
  Filled 2019-04-22 (×4): qty 1

## 2019-04-22 MED ORDER — GUAIFENESIN-DM 100-10 MG/5ML PO SYRP: 15.0000 mL | ORAL_SOLUTION | ORAL | Status: DC | PRN

## 2019-04-22 MED ORDER — GUAIFENESIN ER 600 MG PO TB12
600.0000 mg | ORAL_TABLET | Freq: Two times a day (BID) | ORAL | Status: DC
  Administered 2019-04-22 – 2019-04-26 (×8): 600 mg via ORAL
  Filled 2019-04-22 (×8): qty 1

## 2019-04-22 MED ORDER — INSULIN GLARGINE 100 UNIT/ML ~~LOC~~ SOLN
15.0000 [IU] | Freq: Every day | SUBCUTANEOUS | Status: DC
  Administered 2019-04-22 – 2019-04-25 (×4): 15 [IU] via SUBCUTANEOUS
  Filled 2019-04-22 (×7): qty 0.15

## 2019-04-22 MED ORDER — OXYCODONE HCL 5 MG PO TABS
5.0000 mg | ORAL_TABLET | Freq: Once | ORAL | Status: AC
  Administered 2019-04-22: 5 mg via ORAL
  Filled 2019-04-22: qty 1

## 2019-04-22 MED ORDER — LISINOPRIL 10 MG PO TABS
10.0000 mg | ORAL_TABLET | Freq: Every day | ORAL | Status: DC
  Administered 2019-04-22: 10 mg via ORAL
  Filled 2019-04-22 (×2): qty 1

## 2019-04-22 MED ORDER — ACETAMINOPHEN 325 MG RE SUPP: 325.0000 mg | RECTAL | Status: DC | PRN

## 2019-04-22 MED ORDER — CEFAZOLIN SODIUM-DEXTROSE 2-4 GM/100ML-% IV SOLN
2.0000 g | Freq: Three times a day (TID) | INTRAVENOUS | Status: DC
  Administered 2019-04-22 – 2019-04-26 (×11): 2 g via INTRAVENOUS
  Filled 2019-04-22 (×13): qty 100

## 2019-04-22 MED ORDER — PRAVASTATIN SODIUM 40 MG PO TABS
40.0000 mg | ORAL_TABLET | Freq: Every day | ORAL | Status: DC
  Administered 2019-04-22 – 2019-04-25 (×4): 40 mg via ORAL
  Filled 2019-04-22 (×4): qty 1

## 2019-04-22 MED ORDER — METOPROLOL TARTRATE 5 MG/5ML IV SOLN: 2.0000 mg | INTRAVENOUS | Status: DC | PRN

## 2019-04-22 MED ORDER — AMLODIPINE BESYLATE 10 MG PO TABS
10.0000 mg | ORAL_TABLET | Freq: Every day | ORAL | Status: DC
  Administered 2019-04-23 – 2019-04-26 (×3): 10 mg via ORAL
  Filled 2019-04-22 (×3): qty 1

## 2019-04-22 MED ORDER — PHENOL 1.4 % MT LIQD
1.0000 | OROMUCOSAL | Status: DC | PRN
  Filled 2019-04-22: qty 177

## 2019-04-22 MED ORDER — FUROSEMIDE 40 MG PO TABS
40.0000 mg | ORAL_TABLET | Freq: Every day | ORAL | Status: DC
  Administered 2019-04-22 – 2019-04-23 (×2): 40 mg via ORAL
  Filled 2019-04-22 (×2): qty 1

## 2019-04-22 MED ORDER — LABETALOL HCL 5 MG/ML IV SOLN
10.0000 mg | INTRAVENOUS | Status: DC | PRN
  Administered 2019-04-24: 10 mg via INTRAVENOUS
  Filled 2019-04-22: qty 4

## 2019-04-22 MED ORDER — UMECLIDINIUM BROMIDE 62.5 MCG/INH IN AEPB
1.0000 | INHALATION_SPRAY | Freq: Every day | RESPIRATORY_TRACT | Status: DC
  Administered 2019-04-23 – 2019-04-26 (×3): 1 via RESPIRATORY_TRACT
  Filled 2019-04-22: qty 7

## 2019-04-22 MED ORDER — ONDANSETRON HCL 4 MG/2ML IJ SOLN
4.0000 mg | Freq: Four times a day (QID) | INTRAMUSCULAR | Status: DC | PRN
  Administered 2019-04-23 – 2019-04-24 (×2): 4 mg via INTRAVENOUS
  Filled 2019-04-22 (×2): qty 2

## 2019-04-22 NOTE — Progress Notes (Signed)
ANTICOAGULATION CONSULT NOTE - Initial Consult  Pharmacy Consult for heparin Indication: atrial fibrillation  No Known Allergies  Patient Measurements:   Heparin Dosing Weight: 68.5kg  Vital Signs: Temp: 98 F (36.7 C) (06/27 0743) Temp Source: Oral (06/27 0743) BP: 186/91 (06/27 0915) Pulse Rate: 72 (06/27 0915)  Labs: Recent Labs    04/21/19 0631 04/22/19 0945  HGB 17.7* 17.6*  HCT 52.0 53.7*  PLT  --  181  CREATININE 1.40*  --     Estimated Creatinine Clearance: 49.6 mL/min (A) (by C-G formula based on SCr of 1.4 mg/dL (H)).   Medical History: Past Medical History:  Diagnosis Date  . Aortic insufficiency    MODERATE WITH A BICUSPID AORTIC VAVLE  . Arterial occlusive disease    MULTILEVEL  . CHF (congestive heart failure) ([place])   . COPD (chronic obstructive pulmonary disease) ([place])   . Dilated cardiomyopathy (H[facility])    WITH EJECTION FRACTION DOWN TO 20-25%--WITH CONGESTIVE HEART FAILURE  . Edema    LOWER EXTREMETIES  . Hypertension   . Normal coronary arteries 2009  . Orthopnea   . Peripheral arterial disease ([place])   . Pulmonary hypertension ([place])   . SOB (shortness of breath)     Medications:  Infusions:  . sodium chloride    .  ceFAZolin (ANCEF) IV    . heparin      Assessment: 7 yom presented to the ED with foot pain. Planning for femoral to peroneal bypass. Pt is on chronic apixaban for afib. Will transition to IV heparin. Per patient report, his last dose was yesterday morning so it is ok to start IV heparin now. Baseline CBC is WNL. No bleeding noted.   Goal of Therapy:  Heparin level 0.3-0.7 units/ml aPTT 66-102 seconds Monitor platelets by anticoagulation protocol: Yes   Plan:  Heparin gtt 1000 units/hr Check an 8 hr heparin level and aPTT Daily heparin level, aPTT and CBC  Alee Katen, Rande Lawman 04/22/2019,10:15 AM

## 2019-04-22 NOTE — Consult Note (Signed)
Cardiology Consultation:   Patient ID: Kyle [name] MRN: [id number]; DOB: 1951/01/08  Admit date: 04/22/2019 Date of Consult: 04/22/2019  Primary Care Provider: Alonna Buckler, MD Primary Cardiologist: Dr Martinique   Patient Profile:   Kyle [name] is a 68 y.o. male with a hx of CHF who is being seen today for the preoperative evaluation at the request of Dr Sherren Mocha Early .  History of Present Illness:   Kyle [name] is a 69 year old male with h/o chronic systolic CHF with EF of 17-61% in May 2012. Coronary anatomy was normal by cath. He has a bicuspid AV with moderate AS/AI. He had moderate pulmonary HTN. He was treated medically, his LVEF improved to 55-60%. Unfortunately , he was lost to follow up and was not taking medication for over 2 years. He also has a history of HTN, hyperlipidemia, and PAD s/p right fem-pop bypass. When seen in October 2015 he had evidence of recurrent CHF with severe HTN. Echo showed an EF of 20-25% with moderate AS/AI. He was started back on medication with lasix, carvedilol, and quinapril. He did not come back for follow up as instructed. He was seen in Jan. 2016 for surgical clearance for vascular surgery.  He was seen by [name] for claudication and was found to have severe PAD and underwent aortobifemoral BPG in February 2016. Echo in July 2018 showing normalization of LV function with EF 55%.  He was admitted in January 2020 with hypotension and shock. Had malaise, N/V and sweats. Treated with antibiotics. Had respiratory failure with hypoxia. Sent home with oxygen. He had Afib initially then converted to NSR. Started on Eliquis. Continued on Coreg. Had AKI with creatinine up to 2.7 that improved with most recent creatinine 1.03. LFTs were elevated. Echo showed EF 40-45%. There was RV dysfunction with moderate pulmonary HTN. Moderate AI with mild AS. CT negative for PE but showed advanced emphysema.  He is being followed by Dr Martinique, last seen in 11/2018., he  was doing relatively well,  on oxygen at home. He was non-complaint with Eliquis, but was taking ASA.  He was admitted on with severe rest pain right foot, underwent diagnostic arteriogram with [name] yesterday.  Has a history of prior aortobifemoral bypass in 2016.  Also had a prior right femoral to popliteal bypass with vein in 2010 which is chronically occluded.  He presented with rest pain and a small ulceration over the lateral aspect of his right foot.  Underwent arteriogram yesterday revealing peroneal only runoff.  The plan was for him to have outpatient cardiology evaluation due to severely limited ejection fraction on the echocardiogram and for vein mapping to determine conduit for femoral to peroneal bypass.  His wife called morning saying that he was having worsening rest pain did not sleep at all last night and could not wait.  He was instructed to present to the emergency room to present for admission for pain control, cardiology evaluation and pain mapping.  Kyle [name] states that he has not been having any chest pains recently no shortness of breath, he denies orthopnea, paroxysmal nocturnal dyspnea or lower extremity edema.  His biggest limitation are his claudications that are currently present at rest.  He states that he was compliant with his medications including Eliquis since he saw Dr. Martinique in April.  Heart Pathway Score:     Past Medical History:  Diagnosis Date  . Aortic insufficiency    MODERATE WITH A BICUSPID AORTIC VAVLE  .  Arterial occlusive disease    MULTILEVEL  . CHF (congestive heart failure) ([place])   . COPD (chronic obstructive pulmonary disease) ([place])   . Dilated cardiomyopathy (HCC)    WITH EJECTION FRACTION DOWN TO 20-25%--WITH CONGESTIVE HEART FAILURE  . Edema    LOWER EXTREMETIES  . Hypertension   . Normal coronary arteries 2009  . Orthopnea   . Peripheral arterial disease ([place])   . Pulmonary hypertension ([place])   . SOB (shortness of breath)      Past Surgical History:  Procedure Laterality Date  . ABDOMINAL AORTAGRAM N/A 11/05/2014   Procedure: ABDOMINAL Maxcine Ham;  Surgeon: Angelia Mould, MD;  Location: [facility] CATH LAB;  Service: Cardiovascular;  Laterality: N/A;  . ABDOMINAL AORTOGRAM W/LOWER EXTREMITY Bilateral 04/21/2019   Procedure: ABDOMINAL AORTOGRAM W/LOWER EXTREMITY;  Surgeon: Angelia Mould, MD;  Location: [place] CV LAB;  Service: Cardiovascular;  Laterality: Bilateral;  . AORTA - BILATERAL FEMORAL ARTERY BYPASS GRAFT N/A 12/18/2014   Procedure: AORTOBIFEMORAL BYPASS GRAFT;  Surgeon: Angelia Mould, MD;  Location: [place];  Service: Vascular;  Laterality: N/A;  . CARDIAC CATHETERIZATION  2009   Nl Cors, EF 20%  . COLONOSCOPY    . ENDARTERECTOMY FEMORAL Left 12/18/2014   Procedure: ENDARTERECTOMY FEMORAL;  Surgeon: Angelia Mould, MD;  Location: [place];  Service: Vascular;  Laterality: Left;  . FEMORAL-POPLITEAL BYPASS GRAFT  02/21/2011   right  . OTHER SURGICAL HISTORY     POST RIGHT FEMOROPOPLITEAL BYPASS GRAFT  . PERIPHERAL VASCULAR CATHETERIZATION  11/05/2014   Procedure: LOWER EXTREMITY ANGIOGRAPHY;  Surgeon: Angelia Mould, MD;  Location: [facility] CATH LAB;  Service: Cardiovascular;;    Inpatient Medications: Scheduled Meds: . amLODipine  5 mg Oral Daily  . carvedilol  12.5 mg Oral BID  . furosemide  40 mg Oral Daily  . guaiFENesin  600 mg Oral BID  . insulin glargine  15 Units Subcutaneous QHS  . lisinopril  10 mg Oral Daily  . pantoprazole  40 mg Oral Daily  . potassium chloride  20-40 mEq Oral Once  . pravastatin  40 mg Oral QHS  . [START ON 04/23/2019] umeclidinium bromide  1 puff Inhalation Daily   Continuous Infusions: . sodium chloride    .  ceFAZolin (ANCEF) IV    . heparin 1,000 Units/hr (04/22/19 1113)   PRN Meds: acetaminophen **OR** acetaminophen, alum & mag hydroxide-simeth, bisacodyl, guaiFENesin-dextromethorphan, hydrALAZINE, labetalol, metoprolol tartrate, morphine  injection, ondansetron, oxyCODONE-acetaminophen, phenol, polyethylene glycol  Allergies:   No Known Allergies  Social History:   Social History   Socioeconomic History  . Marital status: Married    Spouse name: Not on file  . Number of children: 1  . Years of education: Not on file  . Highest education level: Not on file  Occupational History    Employer: [employer]  Social Needs  . Financial resource strain: Not on file  . Food insecurity    Worry: Not on file    Inability: Not on file  . Transportation needs    Medical: Not on file    Non-medical: Not on file  Tobacco Use  . Smoking status: Former Smoker    Packs/day: 1.00    Years: 40.00    Pack years: 40.00    Quit date: 10/15/2018    Years since quitting: 0.5  . Smokeless tobacco: Never Used  . Tobacco comment: quit 2010 and quit again 2019  Substance and Sexual Activity  . Alcohol use: No  Alcohol/week: 0.0 standard drinks  . Drug use: No  . Sexual activity: Not on file  Lifestyle  . Physical activity    Days per week: Not on file    Minutes per session: Not on file  . Stress: Not on file  Relationships  . Social Herbalist on phone: Not on file    Gets together: Not on file    Attends religious service: Not on file    Active member of club or organization: Not on file    Attends meetings of clubs or organizations: Not on file    Relationship status: Not on file  . Intimate partner violence    Fear of current or ex partner: Not on file    Emotionally abused: Not on file    Physically abused: Not on file    Forced sexual activity: Not on file  Other Topics Concern  . Not on file  Social History Narrative  . Not on file    Family History:    Family History  Problem Relation Age of Onset  . Diabetes Mother   . Hyperlipidemia Sister   . Hypertension Sister      ROS:  Please see the history of present illness.  All other ROS reviewed and negative.     Physical Exam/Data:    Vitals:   04/22/19 0845 04/22/19 0900 04/22/19 0915 04/22/19 1110  BP: (!) 199/70 (!) 195/76 (!) 186/91 (!) 192/89  Pulse: 60 60 72 71  Resp: 19 (!) 25 (!) 22 (!) 22  Temp:      TempSrc:      SpO2: 95% 94% 93% 96%   No intake or output data in the 24 hours ending 04/22/19 1227 Last 3 Weights 04/21/2019 04/05/2019 12/16/2018  Weight (lbs) 151 lb 151 lb 11.2 oz 152 lb 12.8 oz  Weight (kg) 68.493 kg 68.811 kg 69.31 kg     There is no height or weight on file to calculate BMI.  General:  Well nourished, well developed, in no acute distress HEENT: normal Lymph: no adenopathy Neck: no JVD Endocrine:  No thryomegaly Vascular: No carotid bruits; FA pulses 2+ bilaterally without bruits  Cardiac:  normal S1, S2; RRR; 2/6 systolic murmur Lungs:  clear to auscultation bilaterally, no wheezing, rhonchi or rales  Abd: soft, nontender, no hepatomegaly  Ext: no edema, nonpalpable peripheral pulses, extremities feeling somewhat cold Musculoskeletal:  No deformities, BUE and BLE strength normal and equal Skin: warm and dry  Neuro:  CNs 2-12 intact, no focal abnormalities noted Psych:  Normal affect   EKG:  The EKG was personally reviewed and demonstrates:  SR, LVH with repolarization abnormalities Telemetry:  Telemetry was personally reviewed and demonstrates: Sinus rhythm  Relevant CV Studies: 11/21/2018 - Left ventricle: The cavity size was normal. Systolic function was   mildly to moderately reduced. The estimated ejection fraction was   in the range of 40% to 45%. Diffuse hypokinesis. The study is not   technically sufficient to allow evaluation of LV diastolic   function. - Ventricular septum: The contour showed diastolic flattening. - Aortic valve: Valve mobility was restricted. There was mild   stenosis. There was moderate regurgitation. - Right ventricle: The cavity size was moderately dilated. Wall   thickness was normal. Systolic function was moderately reduced. - Right atrium: The  atrium was severely dilated. - Tricuspid valve: There was moderate regurgitation. - Pulmonary arteries: Systolic pressure was moderately increased.   PA peak pressure: 53 mm Hg (  S).  Impressions:  - Compared to the prior study, there has been no significant   interval change.  Laboratory Data:  High Sensitivity Troponin:  No results for input(s): TROPONINIHS in the last 720 hours.   Cardiac EnzymesNo results for input(s): TROPONINI in the last 168 hours. No results for input(s): TROPIPOC in the last 168 hours.  Chemistry Recent Labs  Lab 04/21/19 0631  NA 139  K 4.7  CL 109  GLUCOSE 120*  BUN 32*  CREATININE 1.40*    No results for input(s): PROT, ALBUMIN, AST, ALT, ALKPHOS, BILITOT in the last 168 hours. Hematology Recent Labs  Lab 04/21/19 0631 04/22/19 0945  WBC  --  7.4  RBC  --  5.78  HGB 17.7* 17.6*  HCT 52.0 53.7*  MCV  --  92.9  MCH  --  30.4  MCHC  --  32.8  RDW  --  15.8*  PLT  --  181   BNPNo results for input(s): BNP, PROBNP in the last 168 hours.  DDimer No results for input(s): DDIMER in the last 168 hours.   Radiology/Studies:  No results found.  Assessment and Plan:   1.  Preoperative cardiovascular evaluation for vascular surgery -This patient is consider high risk given his poly-morbidity including chronic systolic CHF with LVEF 40 to 45%, aortic valve disease, COPD and poorly controlled hypertension. However he has known normal coronary arteries and has not had any chest pain recently, he has no signs of fluid overload, his aortic stenosis is only mild and aortic insufficiency is moderate. He is currently significantly hypertensive so the only thing I would change prior to surgery I would optimize his blood pressure medications as listed below  2.  Hypertension -Poorly controlled -Increase amlodipine to 10 mg daily, I will give additional 5 today -He has acute kidney failure, I would not increase lisinopril but add Imdur 30 mg daily  starting today.  3. Chronic systolic CHF.  He has a history of cardiomyopathy- nonischemic.  Most recent LVEF 40-45%, he appears to be euvolemic.    4. PAD - followed by Dr Sherren Mocha Early  5. Hyperlipidemia.   Continue pravastatin.   5. Biscuspid AV with mild AS/moderate AI.  No change in murmur  6. Tobacco abuse-patient states he quit  7. DM now on insulin per primary care.  8. Advanced emphysema. On home oxygen now.   9. Afib in setting of sepsis. In NSR now.  He was on Eliquis at home currently on heparin, I would continue.  There is no need for aspirin in addition to Eliquis.   For questions or updates, please contact [place] Please consult [email or web address] for contact info under   Signed, Ena Dawley, MD  04/22/2019 12:27 PM

## 2019-04-22 NOTE — ED Notes (Signed)
ED TO INPATIENT HANDOFF REPORT  ED Nurse Name and Phone #: Caryl Pina [phone]  Q Name/Age/Gender Kyle [name] 68 y.o. male Room/Bed: 032C/032C  Code Status   Code Status: Full Code  Home/SNF/Other Home Patient oriented to: situation Is this baseline? Yes   Triage Complete: Triage complete  Chief Complaint Possible DVT bad circulation in right leg  Triage Note Pt endorses right foot pain for several months. Has hx of bypass in same leg and this feels similar. Here for outpatient procedure on same leg to check circulation yesterday. Unknown results. Hypertensive and has hx of same, did not take meds this morning.    Allergies No Known Allergies  Level of Care/Admitting Diagnosis ED Disposition    ED Disposition Condition [facility] Area: [place] [[id number]]  Level of Care: Telemetry Surgical [105]  Covid Evaluation: N/A  Diagnosis: PAD (peripheral artery disease) [facility]) [[id number]]  Admitting Physician: EARLY, TODD F [[id number]]  Attending Physician: Shirley Friar  Estimated length of stay: past midnight tomorrow  Certification:: I certify this patient will need inpatient services for at least 2 midnights  Bed request comments: 4 east  PT Class (Do Not Modify): Inpatient [101]  PT Acc Code (Do Not Modify): Private [1]       B Medical/Surgery History Past Medical History:  Diagnosis Date  . Aortic insufficiency    MODERATE WITH A BICUSPID AORTIC VAVLE  . Arterial occlusive disease    MULTILEVEL  . CHF (congestive heart failure) ([place])   . COPD (chronic obstructive pulmonary disease) ([place])   . Dilated cardiomyopathy (HCC)    WITH EJECTION FRACTION DOWN TO 20-25%--WITH CONGESTIVE HEART FAILURE  . Edema    LOWER EXTREMETIES  . Hypertension   . Normal coronary arteries 2009  . Orthopnea   . Peripheral arterial disease ([place])   . Pulmonary hypertension ([place])   . SOB (shortness of breath)    Past Surgical History:  Procedure  Laterality Date  . ABDOMINAL AORTAGRAM N/A 11/05/2014   Procedure: ABDOMINAL Maxcine Ham;  Surgeon: Angelia Mould, MD;  Location: [facility] CATH LAB;  Service: Cardiovascular;  Laterality: N/A;  . ABDOMINAL AORTOGRAM W/LOWER EXTREMITY Bilateral 04/21/2019   Procedure: ABDOMINAL AORTOGRAM W/LOWER EXTREMITY;  Surgeon: Angelia Mould, MD;  Location: [place] CV LAB;  Service: Cardiovascular;  Laterality: Bilateral;  . AORTA - BILATERAL FEMORAL ARTERY BYPASS GRAFT N/A 12/18/2014   Procedure: AORTOBIFEMORAL BYPASS GRAFT;  Surgeon: Angelia Mould, MD;  Location: [place];  Service: Vascular;  Laterality: N/A;  . CARDIAC CATHETERIZATION  2009   Nl Cors, EF 20%  . COLONOSCOPY    . ENDARTERECTOMY FEMORAL Left 12/18/2014   Procedure: ENDARTERECTOMY FEMORAL;  Surgeon: Angelia Mould, MD;  Location: [place];  Service: Vascular;  Laterality: Left;  . FEMORAL-POPLITEAL BYPASS GRAFT  02/21/2011   right  . OTHER SURGICAL HISTORY     POST RIGHT FEMOROPOPLITEAL BYPASS GRAFT  . PERIPHERAL VASCULAR CATHETERIZATION  11/05/2014   Procedure: LOWER EXTREMITY ANGIOGRAPHY;  Surgeon: Angelia Mould, MD;  Location: [facility] CATH LAB;  Service: Cardiovascular;;     A IV Location/Drains/Wounds Patient Lines/Drains/Airways Status   Active Line/Drains/Airways    Name:   Placement date:   Placement time:   Site:   Days:   Peripheral IV 04/22/19 Left Antecubital   04/22/19    0944    Antecubital   less than 1          Intake/Output Last 24 hours No  intake or output data in the 24 hours ending 04/22/19 1054  Labs/Imaging Results for orders placed or performed during the hospital encounter of 04/22/19 (from the past 48 hour(s))  CBC     Status: Abnormal   Collection Time: 04/22/19  9:45 AM  Result Value Ref Range   WBC 7.4 4.0 - 10.5 K/uL   RBC 5.78 4.22 - 5.81 MIL/uL   Hemoglobin 17.6 (H) 13.0 - 17.0 g/dL   HCT 53.7 (H) 39.0 - 52.0 %   MCV 92.9 80.0 - 100.0 fL   MCH 30.4 26.0 - 34.0 pg   MCHC  32.8 30.0 - 36.0 g/dL   RDW 15.8 (H) 11.5 - 15.5 %   Platelets 181 150 - 400 K/uL   nRBC 0.0 0.0 - 0.2 %    Comment: Performed at [facility], [address] [place], [place] [address]  Lactic acid, plasma     Status: None   Collection Time: 04/22/19  9:45 AM  Result Value Ref Range   Lactic Acid, Venous 1.1 0.5 - 1.9 mmol/L    Comment: Performed at [facility] [address] [place], [place] [address]   No results found.  Pending Labs Unresulted Labs (From admission, onward)    Start     Ordered   04/23/19 0500  CBC  Tomorrow morning,   R     04/22/19 0953   04/23/19 6503  Basic metabolic panel  Tomorrow morning,   R     04/22/19 0953   04/23/19 0500  Heparin level (unfractionated)  Daily,   R     04/22/19 1015   04/23/19 0500  CBC  Daily,   R     04/22/19 1015   04/23/19 0500  APTT  Daily,   R     04/22/19 1015   04/22/19 1830  Heparin level (unfractionated)  Once-Timed,   STAT     04/22/19 1015   04/22/19 1830  APTT  Once-Timed,   STAT     04/22/19 1015   04/22/19 0948  Urinalysis, Routine w reflex microscopic  Once,   STAT     04/22/19 0953          Vitals/Pain Today's Vitals   04/22/19 0845 04/22/19 0900 04/22/19 0915 04/22/19 0934  BP: (!) 199/70 (!) 195/76 (!) 186/91   Pulse: 60 60 72   Resp: 19 (!) 25 (!) 22   Temp:      TempSrc:      SpO2: 95% 94% 93%   PainSc:    3     Isolation Precautions No active isolations  Medications Medications  potassium chloride SA (K-DUR) CR tablet 20-40 mEq (has no administration in time range)  ondansetron (ZOFRAN) injection 4 mg (has no administration in time range)  alum & mag hydroxide-simeth (MAALOX/MYLANTA) 200-200-20 MG/5ML suspension 15-30 mL (has no administration in time range)  pantoprazole (PROTONIX) EC tablet 40 mg (has no administration in time range)  labetalol (NORMODYNE) injection 10 mg (has no administration in time range)  hydrALAZINE (APRESOLINE) injection 5 mg (has no administration  in time range)  metoprolol tartrate (LOPRESSOR) injection 2-5 mg (has no administration in time range)  guaiFENesin-dextromethorphan (ROBITUSSIN DM) 100-10 MG/5ML syrup 15 mL (has no administration in time range)  phenol (CHLORASEPTIC) mouth spray 1 spray (has no administration in time range)  0.9 %  sodium chloride infusion (has no administration in time range)  ceFAZolin (ANCEF) IVPB 2g/100 mL premix (has no administration in  time range)  acetaminophen (TYLENOL) tablet 325-650 mg (has no administration in time range)    Or  acetaminophen (TYLENOL) suppository 325-650 mg (has no administration in time range)  oxyCODONE-acetaminophen (PERCOCET/ROXICET) 5-325 MG per tablet 1-2 tablet (has no administration in time range)  morphine 2 MG/ML injection 2 mg (has no administration in time range)  polyethylene glycol (MIRALAX / GLYCOLAX) packet 17 g (has no administration in time range)  bisacodyl (DULCOLAX) suppository 10 mg (has no administration in time range)  amLODipine (NORVASC) tablet 5 mg (has no administration in time range)  umeclidinium-vilanterol (ANORO ELLIPTA) 62.5-25 MCG/INH 1 puff (has no administration in time range)  carvedilol (COREG) tablet 12.5 mg (has no administration in time range)  furosemide (LASIX) tablet 40 mg (has no administration in time range)  guaiFENesin (MUCINEX) 12 hr tablet 600 mg (has no administration in time range)  insulin glargine (LANTUS) injection 15 Units (has no administration in time range)  pravastatin (PRAVACHOL) tablet 40 mg (has no administration in time range)  lisinopril (ZESTRIL) tablet 10 mg (has no administration in time range)  heparin ADULT infusion 100 units/mL (25000 units/264mL sodium chloride 0.45%) (has no administration in time range)  oxyCODONE (Oxy IR/ROXICODONE) immediate release tablet 5 mg (5 mg Oral Given 04/22/19 0934)    Mobility walks Low fall risk   Focused Assessments Cardiac Assessment Handoff:    Lab Results   Component Value Date   CKTOTAL 101 10/17/2008   CKMB 4.7 (H) 10/17/2008   TROPONINI 2.40 (HH) 11/21/2018   No results found for: DDIMER Does the Patient currently have chest pain? No     R Recommendations: See Admitting Provider Note  Report given to:   Additional Notes: surgery tentative for early next week

## 2019-04-22 NOTE — Progress Notes (Signed)
ANTICOAGULATION CONSULT NOTE - Follow-Up Consult  Pharmacy Consult for heparin Indication: atrial fibrillation  No Known Allergies  Patient Measurements: Height: 5\' 11"  (180.3 cm) Weight: 148 lb 9.6 oz (67.4 kg) IBW/kg (Calculated) : 75.3 Heparin Dosing Weight: 68.5kg  Vital Signs: Temp: 98 F (36.7 C) (06/27 1200) Temp Source: Oral (06/27 1200) BP: 192/89 (06/27 1110) Pulse Rate: 57 (06/27 1200)  Labs: Recent Labs    04/21/19 0631 04/22/19 0945 04/22/19 1819  HGB 17.7* 17.6*  --   HCT 52.0 53.7*  --   PLT  --  181  --   APTT  --   --  87*  HEPARINUNFRC  --   --  0.22*  CREATININE 1.40*  --   --     Estimated Creatinine Clearance: 48.8 mL/min (A) (by C-G formula based on SCr of 1.4 mg/dL (H)).   Medical History: Past Medical History:  Diagnosis Date  . Aortic insufficiency    MODERATE WITH A BICUSPID AORTIC VAVLE  . Arterial occlusive disease    MULTILEVEL  . CHF (congestive heart failure) ([place])   . COPD (chronic obstructive pulmonary disease) ([place])   . Dilated cardiomyopathy (HCC)    WITH EJECTION FRACTION DOWN TO 20-25%--WITH CONGESTIVE HEART FAILURE  . Edema    LOWER EXTREMETIES  . Hypertension   . Normal coronary arteries 2009  . Orthopnea   . Peripheral arterial disease ([place])   . Pulmonary hypertension ([place])   . SOB (shortness of breath)     Medications:  Infusions:  . sodium chloride 30 mL/hr at 04/22/19 1521  .  ceFAZolin (ANCEF) IV 2 g (04/22/19 1734)  . heparin 1,000 Units/hr (04/22/19 1113)    Assessment: 68 yo M presented to the ED with foot pain. Planning for femoral to peroneal bypass. Pt is on chronic apixaban for afib. Will transition to IV heparin. Per patient report, his last dose was 6/26 AM.  Baseline CBC is WNL. No bleeding noted.   Baseline heparin level was not obtained prior to heparin initiation.  Heparin level is subtherapeutic on heparin at 1000 units/hr so it is unlikely that previous apixaban dosing is influencing heparin  level.  Will increase heparin rate and continue future monitoring based on heparin levels alone.  Goal of Therapy:  Heparin level 0.3-0.7 units/ml Monitor platelets by anticoagulation protocol: Yes   Plan:  Increase Heparin gtt 1150 units/hr Check an 6 hr heparin level  Daily heparin level and CBC  Genecis Veley, Pharm.D., BCPS Clinical Pharmacist  **Pharmacist phone directory can now be found on [email or web address] ([email or web address]).  Listed under [place].  04/22/2019 7:19 PM

## 2019-04-22 NOTE — ED Provider Notes (Signed)
[facility] Emergency Department Provider Note MRN:  [id number]  Arrival date & time: 04/22/19     Chief Complaint   Foot Pain   History of Present Illness   Kyle [name] is a 68 y.o. year-old male with a history of CHF, COPD, PAD presenting to the ED with chief complaint of foot pain.  2 to 3 months of persistent right foot and ankle pain.  Patient has known peripheral vascular disease, had a vascular contrast study performed yesterday, here today because the pain is still there.  The pain has not worsened over the past few days, he denies fever, no chest pain or shortness of breath, no abdominal pain, no new symptoms.  No exacerbating or alleviating factors.  Pain is constant, worse at night.  Review of Systems  A complete 10 system review of systems was obtained and all systems are negative except as noted in the HPI and PMH.   Patient's Health History    Past Medical History:  Diagnosis Date  . Aortic insufficiency    MODERATE WITH A BICUSPID AORTIC VAVLE  . Arterial occlusive disease    MULTILEVEL  . CHF (congestive heart failure) ([place])   . COPD (chronic obstructive pulmonary disease) ([place])   . Dilated cardiomyopathy (HCC)    WITH EJECTION FRACTION DOWN TO 20-25%--WITH CONGESTIVE HEART FAILURE  . Edema    LOWER EXTREMETIES  . Hypertension   . Normal coronary arteries 2009  . Orthopnea   . Peripheral arterial disease ([place])   . Pulmonary hypertension ([place])   . SOB (shortness of breath)     Past Surgical History:  Procedure Laterality Date  . ABDOMINAL AORTAGRAM N/A 11/05/2014   Procedure: ABDOMINAL Maxcine Ham;  Surgeon: Angelia Mould, MD;  Location: [facility] CATH LAB;  Service: Cardiovascular;  Laterality: N/A;  . ABDOMINAL AORTOGRAM W/LOWER EXTREMITY Bilateral 04/21/2019   Procedure: ABDOMINAL AORTOGRAM W/LOWER EXTREMITY;  Surgeon: Angelia Mould, MD;  Location: [place] CV LAB;  Service: Cardiovascular;  Laterality: Bilateral;  . AORTA -  BILATERAL FEMORAL ARTERY BYPASS GRAFT N/A 12/18/2014   Procedure: AORTOBIFEMORAL BYPASS GRAFT;  Surgeon: Angelia Mould, MD;  Location: [place];  Service: Vascular;  Laterality: N/A;  . CARDIAC CATHETERIZATION  2009   Nl Cors, EF 20%  . COLONOSCOPY    . ENDARTERECTOMY FEMORAL Left 12/18/2014   Procedure: ENDARTERECTOMY FEMORAL;  Surgeon: Angelia Mould, MD;  Location: [place];  Service: Vascular;  Laterality: Left;  . FEMORAL-POPLITEAL BYPASS GRAFT  02/21/2011   right  . OTHER SURGICAL HISTORY     POST RIGHT FEMOROPOPLITEAL BYPASS GRAFT  . PERIPHERAL VASCULAR CATHETERIZATION  11/05/2014   Procedure: LOWER EXTREMITY ANGIOGRAPHY;  Surgeon: Angelia Mould, MD;  Location: [facility] CATH LAB;  Service: Cardiovascular;;    Family History  Problem Relation Age of Onset  . Diabetes Mother   . Hyperlipidemia Sister   . Hypertension Sister     Social History   Socioeconomic History  . Marital status: Married    Spouse name: Not on file  . Number of children: 1  . Years of education: Not on file  . Highest education level: Not on file  Occupational History    Employer: [employer]  Social Needs  . Financial resource strain: Not on file  . Food insecurity    Worry: Not on file    Inability: Not on file  . Transportation needs    Medical: Not on file    Non-medical: Not on file  Tobacco Use  . Smoking status: Former Smoker    Packs/day: 1.00    Years: 40.00    Pack years: 40.00    Quit date: 10/15/2018    Years since quitting: 0.5  . Smokeless tobacco: Never Used  . Tobacco comment: quit 2010 and quit again 2019  Substance and Sexual Activity  . Alcohol use: No    Alcohol/week: 0.0 standard drinks  . Drug use: No  . Sexual activity: Not on file  Lifestyle  . Physical activity    Days per week: Not on file    Minutes per session: Not on file  . Stress: Not on file  Relationships  . Social Herbalist on phone: Not on file    Gets together: Not on file     Attends religious service: Not on file    Active member of club or organization: Not on file    Attends meetings of clubs or organizations: Not on file    Relationship status: Not on file  . Intimate partner violence    Fear of current or ex partner: Not on file    Emotionally abused: Not on file    Physically abused: Not on file    Forced sexual activity: Not on file  Other Topics Concern  . Not on file  Social History Narrative  . Not on file     Physical Exam  Vital Signs and Nursing Notes reviewed Vitals:   04/22/19 0915 04/22/19 1110  BP: (!) 186/91 (!) 192/89  Pulse: 72 71  Resp: (!) 22 (!) 22  Temp:    SpO2: 93% 96%    CONSTITUTIONAL: Well-appearing, NAD NEURO:  Alert and oriented x 3, no focal deficits EYES:  eyes equal and reactive ENT/NECK:  no LAD, no JVD CARDIO: Regular rate, well-perfused, normal S1 and S2 PULM:  CTAB no wheezing or rhonchi GI/GU:  normal bowel sounds, non-distended, non-tender MSK/SPINE:  No gross deformities, mild edema of the right foot SKIN:  no rash, atraumatic PSYCH:  Appropriate speech and behavior  Diagnostic and Interventional Summary    Labs Reviewed  CBC - Abnormal; Notable for the following components:      Result Value   Hemoglobin 17.6 (*)    HCT 53.7 (*)    RDW 15.8 (*)    All other components within normal limits  URINALYSIS, ROUTINE W REFLEX MICROSCOPIC - Abnormal; Notable for the following components:   Protein, ur 30 (*)    All other components within normal limits  LACTIC ACID, PLASMA  HEPARIN LEVEL (UNFRACTIONATED)  APTT    VAS [place] LOWER EXTREMITY SAPHENOUS VEIN MAPPING    (Results Pending)    Medications  potassium chloride SA (K-DUR) CR tablet 20-40 mEq (has no administration in time range)  ondansetron (ZOFRAN) injection 4 mg (has no administration in time range)  alum & mag hydroxide-simeth (MAALOX/MYLANTA) 200-200-20 MG/5ML suspension 15-30 mL (has no administration in time range)  pantoprazole  (PROTONIX) EC tablet 40 mg (has no administration in time range)  labetalol (NORMODYNE) injection 10 mg (has no administration in time range)  hydrALAZINE (APRESOLINE) injection 5 mg (has no administration in time range)  metoprolol tartrate (LOPRESSOR) injection 2-5 mg (has no administration in time range)  guaiFENesin-dextromethorphan (ROBITUSSIN DM) 100-10 MG/5ML syrup 15 mL (has no administration in time range)  phenol (CHLORASEPTIC) mouth spray 1 spray (has no administration in time range)  0.9 %  sodium chloride infusion (has no administration in time range)  ceFAZolin (  ANCEF) IVPB 2g/100 mL premix (has no administration in time range)  acetaminophen (TYLENOL) tablet 325-650 mg (has no administration in time range)    Or  acetaminophen (TYLENOL) suppository 325-650 mg (has no administration in time range)  oxyCODONE-acetaminophen (PERCOCET/ROXICET) 5-325 MG per tablet 1-2 tablet (has no administration in time range)  morphine 2 MG/ML injection 2 mg (has no administration in time range)  polyethylene glycol (MIRALAX / GLYCOLAX) packet 17 g (has no administration in time range)  bisacodyl (DULCOLAX) suppository 10 mg (has no administration in time range)  amLODipine (NORVASC) tablet 5 mg (5 mg Oral Given 04/22/19 1109)  carvedilol (COREG) tablet 12.5 mg (has no administration in time range)  furosemide (LASIX) tablet 40 mg (has no administration in time range)  guaiFENesin (MUCINEX) 12 hr tablet 600 mg (has no administration in time range)  insulin glargine (LANTUS) injection 15 Units (has no administration in time range)  pravastatin (PRAVACHOL) tablet 40 mg (has no administration in time range)  lisinopril (ZESTRIL) tablet 10 mg (10 mg Oral Given 04/22/19 1110)  heparin ADULT infusion 100 units/mL (25000 units/233mL sodium chloride 0.45%) (1,000 Units/hr Intravenous New Bag/Given 04/22/19 1113)  umeclidinium bromide (INCRUSE ELLIPTA) 62.5 MCG/INH 1 puff (has no administration in time range)   oxyCODONE (Oxy IR/ROXICODONE) immediate release tablet 5 mg (5 mg Oral Given 04/22/19 0934)     Procedures Critical Care  ED Course and Medical Decision Making  I have reviewed the triage vital signs and the nursing notes.  Pertinent labs & imaging results that were available during my care of the patient were reviewed by me and considered in my medical decision making (see below for details).  Chronic pain that seems to be related to vascular insufficiency in this 68 year old male, will consult vascular for recommendations.  Admitted to vascular surgery team for further care.  Barth Kirks. Sedonia Small, MD [place] [email or web address]  Final Clinical Impressions(s) / ED Diagnoses     ICD-10-CM   1. Foot pain, right  M79.671   2. Arterial insufficiency of lower extremity [facility])  I73.9     ED Discharge Orders    None         Maudie Flakes, MD 04/22/19 1302

## 2019-04-22 NOTE — Progress Notes (Signed)
VASCULAR LAB PRELIMINARY  PRELIMINARY  PRELIMINARY  PRELIMINARY  Vein mapping completed.    Preliminary report:  See CV Proc for preliminary results.   Annett Boxwell, RVT 04/22/2019, 5:29 PM

## 2019-04-22 NOTE — ED Notes (Signed)
Vein Specialist at bedside for evaluation.

## 2019-04-22 NOTE — H&P (Signed)
[facility] [place]  Patient name: Kyle [name] MRN: [id number] DOB: [birth year] Sex: male    HPI: Kyle [name] is a 68 y.o. male presented to the emergency room with severe rest pain right foot.  He underwent diagnostic arteriogram with [name] yesterday.  Has a history of prior aortobifemoral bypass in 2016.  Also had a prior right femoral to popliteal bypass with vein in 2010 which is chronically occluded.  He presented with rest pain and a small ulceration over the lateral aspect of his right foot.  Underwent arteriogram yesterday revealing peroneal only runoff.  The plan was for him to have outpatient cardiology evaluation due to severely limited ejection fraction on the echocardiogram and for vein mapping to determine conduit for femoral to peroneal bypass.  His wife called this morning saying that he was having worsening rest pain did not sleep at all last night and could not wait.  He was instructed to present to the emergency room to present for admission for pain control, cardiology evaluation and pain mapping.  Past Medical History:  Diagnosis Date  . Aortic insufficiency    MODERATE WITH A BICUSPID AORTIC VAVLE  . Arterial occlusive disease    MULTILEVEL  . CHF (congestive heart failure) ([place])   . COPD (chronic obstructive pulmonary disease) ([place])   . Dilated cardiomyopathy (HCC)    WITH EJECTION FRACTION DOWN TO 20-25%--WITH CONGESTIVE HEART FAILURE  . Edema    LOWER EXTREMETIES  . Hypertension   . Normal coronary arteries 2009  . Orthopnea   . Peripheral arterial disease ([place])   . Pulmonary hypertension ([place])   . SOB (shortness of breath)     Family History  Problem Relation Age of Onset  . Diabetes Mother   . Hyperlipidemia Sister   . Hypertension Sister     SOCIAL HISTORY: Social History   Tobacco Use  . Smoking status: Former Smoker    Packs/day: 1.00    Years: 40.00    Pack years: 40.00    Quit date: 10/15/2018    Years since quitting: 0.5  . Smokeless tobacco: Never Used  . Tobacco comment: quit 2010 and quit again 2019  Substance Use Topics  . Alcohol use: No    Alcohol/week: 0.0 standard drinks    No Known Allergies  Current Facility-Administered Medications  Medication Dose Route Frequency Provider Last Rate Last Dose  . oxyCODONE (Oxy IR/ROXICODONE) immediate release tablet 5 mg  5 mg Oral Once Maudie Flakes, MD       Current Outpatient Medications  Medication Sig Dispense Refill  . ACCU-CHEK FASTCLIX LANCETS MISC     . ACCU-CHEK GUIDE test strip     . amLODipine (NORVASC) 5 MG tablet Take 1 tablet (5 mg total) by mouth daily. 90 tablet 3  . ANORO ELLIPTA 62.5-25 MCG/INH AEPB INHALE 1 PUFF INTO THE LUNGS DAILY (Patient taking differently: Inhale 1 puff into the lungs daily. ) 60 each 0  . apixaban (ELIQUIS) 5 MG TABS tablet Take 1 tablet (5 mg total) by mouth 2 (two) times daily. 60 tablet 5  . BD PEN NEEDLE NANO U/F 32G X 4 MM MISC     . carvedilol (COREG) 12.5 MG tablet TAKE 1 TABLET TWICE DAILY WITH MEALS (Patient taking differently: Take 12.5 mg by mouth 2 (two) times a day. ) 180 tablet 3  . cefdinir (OMNICEF) 300 MG capsule Take 300 mg by mouth 2 (two) times daily.    Marland Kitchen  furosemide (LASIX) 40 MG tablet TAKE 1 TABLET (40 MG TOTAL) BY MOUTH DAILY. 90 tablet 6  . guaiFENesin (MUCINEX) 600 MG 12 hr tablet Take 2 tablets (1,200 mg total) by mouth 2 (two) times daily. (Patient taking differently: Take 600 mg by mouth 2 (two) times daily. ) 30 tablet 0  . insulin glargine (LANTUS) 100 UNIT/ML injection Inject 15 Units into the skin at bedtime.    . metFORMIN (GLUCOPHAGE) 500 MG tablet Take 500 mg by mouth 2 (two) times a day.     . oxyCODONE-acetaminophen (PERCOCET/ROXICET) 5-325 MG tablet Take 1 tablet by mouth at bedtime as needed for severe pain (pain/sleep).    . pravastatin (PRAVACHOL) 40 MG tablet TAKE 1 TABLET AT BEDTIME (Patient taking differently: Take 40  mg by mouth at bedtime. TAKE 1 TABLET AT BEDTIME) 90 tablet 3  . quinapril (ACCUPRIL) 10 MG tablet TAKE 1 TABLET (10 MG TOTAL) DAILY. (Patient taking differently: Take 10 mg by mouth daily. ) 90 tablet 3    REVIEW OF SYSTEMS:  Negative aside from past medical history  PHYSICAL EXAM: Vitals:   04/22/19 0737 04/22/19 0743  BP: (!) 197/86 (!) 197/86  Pulse: 64 72  Resp: 18 15  Temp: 97.8 F (36.6 C) 98 F (36.7 C)  TempSrc: Oral Oral  SpO2: 94% 95%    GENERAL: The patient is a well-nourished male, in no acute distress. The vital signs are documented above. CARDIOVASCULAR: Palpable right femoral pulse.  Rubor on his right foot PULMONARY: There is good air exchange  MUSCULOSKELETAL: There are no major deformities or cyanosis. NEUROLOGIC: No focal weakness or paresthesias are detected. SKIN: There are no ulcers or rashes noted. PSYCHIATRIC: The patient has a normal affect.  DATA:  Angiogram yesterday revealing occluded femoropopliteal with peroneal only runoff  MEDICAL ISSUES: Admission for pain control, cardiology consult for preoperative optimization and vein mapping.  Discussed with patient who understands    Rosetta Posner, MD [facility] Tel [phone] Pager [phone]

## 2019-04-22 NOTE — ED Notes (Signed)
Pt aware of need for urine specimen; urinal at bedside

## 2019-04-22 NOTE — ED Triage Notes (Signed)
Pt endorses right foot pain for several months. Has hx of bypass in same leg and this feels similar. Here for outpatient procedure on same leg to check circulation yesterday. Unknown results. Hypertensive and has hx of same, did not take meds this morning.

## 2019-04-23 LAB — BASIC METABOLIC PANEL
Anion gap: 6 (ref 5–15)
BUN: 27 mg/dL — ABNORMAL HIGH (ref 8–23)
CO2: 25 mmol/L (ref 22–32)
Calcium: 8.8 mg/dL — ABNORMAL LOW (ref 8.9–10.3)
Chloride: 109 mmol/L (ref 98–111)
Creatinine, Ser: 1.75 mg/dL — ABNORMAL HIGH (ref 0.61–1.24)
GFR calc Af Amer: 46 mL/min — ABNORMAL LOW (ref >60)
GFR calc non Af Amer: 39 mL/min — ABNORMAL LOW (ref >60)
Glucose, Bld: 120 mg/dL — ABNORMAL HIGH (ref 70–99)
Potassium: 4.5 mmol/L (ref 3.5–5.1)
Sodium: 140 mmol/L (ref 135–145)

## 2019-04-23 LAB — CBC
HCT: 45.4 % (ref 39.0–52.0)
Hemoglobin: 14.6 g/dL (ref 13.0–17.0)
MCH: 30.3 pg (ref 26.0–34.0)
MCHC: 32.2 g/dL (ref 30.0–36.0)
MCV: 94.2 fL (ref 80.0–100.0)
Platelets: 162 10*3/uL (ref 150–400)
RBC: 4.82 MIL/uL (ref 4.22–5.81)
RDW: 15.5 % (ref 11.5–15.5)
WBC: 7.4 10*3/uL (ref 4.0–10.5)
nRBC: 0 % (ref 0.0–0.2)

## 2019-04-23 LAB — HEPARIN LEVEL (UNFRACTIONATED)
Heparin Unfractionated: 0.3 IU/mL (ref 0.30–0.70)
Heparin Unfractionated: 0.39 IU/mL (ref 0.30–0.70)

## 2019-04-23 LAB — GLUCOSE, CAPILLARY
Glucose-Capillary: 192 mg/dL — ABNORMAL HIGH (ref 70–99)
Glucose-Capillary: 180 mg/dL — ABNORMAL HIGH (ref 70–99)
Glucose-Capillary: 139 mg/dL — ABNORMAL HIGH (ref 70–99)

## 2019-04-23 MED ORDER — ARFORMOTEROL TARTRATE 15 MCG/2ML IN NEBU
15.0000 ug | INHALATION_SOLUTION | Freq: Two times a day (BID) | RESPIRATORY_TRACT | Status: DC
  Administered 2019-04-23 – 2019-04-26 (×5): 15 ug via RESPIRATORY_TRACT
  Filled 2019-04-23 (×6): qty 2

## 2019-04-23 NOTE — Progress Notes (Signed)
ANTICOAGULATION CONSULT NOTE - Follow-Up Consult  Pharmacy Consult for heparin Indication: atrial fibrillation  No Known Allergies  Patient Measurements: Height: 5\' 11"  (180.3 cm) Weight: 148 lb 9.6 oz (67.4 kg) IBW/kg (Calculated) : 75.3 Heparin Dosing Weight: 68.5kg  Vital Signs: Temp: 97.4 F (36.3 C) (06/28 0824) Temp Source: Oral (06/28 0824) BP: 172/79 (06/28 0824) Pulse Rate: 84 (06/28 0841)  Labs: Recent Labs    04/21/19 0631 04/22/19 0945 04/22/19 1819 04/23/19 0222 04/23/19 0957  HGB 17.7* 17.6*  --  14.6  --   HCT 52.0 53.7*  --  45.4  --   PLT  --  181  --  162  --   APTT  --   --  87*  --   --   HEPARINUNFRC  --   --  0.22* 0.30 0.39  CREATININE 1.40*  --   --  1.75*  --     Estimated Creatinine Clearance: 39 mL/min (A) (by C-G formula based on SCr of 1.75 mg/dL (H)).   Medications:  Infusions:  . sodium chloride 30 mL/hr at 04/22/19 1521  .  ceFAZolin (ANCEF) IV 2 g (04/23/19 0953)  . heparin 1,200 Units/hr (04/23/19 5615)    Assessment: 68 yo M presented to the ED with foot pain. Planning for femoral to peroneal bypass. Pt is on chronic apixaban for afib. Will transition to IV heparin. Per patient report, his last dose was 6/26 AM.     Heparin level is therapeutic at 0.39 on 1200 units/hr. No bleeding noted, CBC normal.   Goal of Therapy:  Heparin level 0.3-0.7 units/ml Monitor platelets by anticoagulation protocol: Yes   Plan:  Continue heparin gtt at 1200 units/hr Daily heparin level and CBC Monitor for s/sx of bleeding   Thank you for involving pharmacy in this patient's care.  Renold Genta, PharmD, BCPS Clinical Pharmacist Clinical phone for 04/23/2019 until 3p is [phone] 04/23/2019 11:18 AM  **Pharmacist phone directory can be found on [email or web address] listed under [place]**

## 2019-04-23 NOTE — Progress Notes (Signed)
04/22/2019 1200 Received pt to room 4E-24 from ED.  Pt is A&O, no C/O pain voiced at this time.  Tele monitor applied and CCMD notified.  CHG bath given.  Oriented to room, call light and bed.  Call bell in reach. [place]

## 2019-04-23 NOTE — Progress Notes (Signed)
Dr Francesca Oman consult note, preop recs per her recommendations for planned redo RLE bypass. Bp's are variable but trends much better over the last few day. Norvasc increased to 10mg  just this AM. With worsening renal function would hold lasix, perhaps some injury from contrast. If additional oral agent needed would start hydralazine. No additional recs today.   J BranchMD

## 2019-04-23 NOTE — Progress Notes (Signed)
ANTICOAGULATION CONSULT NOTE - Follow Up Consult  Pharmacy Consult for heparin Indication: atrial fibrillation  Labs: Recent Labs    04/21/19 0631 04/22/19 0945 04/22/19 1819 04/23/19 0222  HGB 17.7* 17.6*  --  14.6  HCT 52.0 53.7*  --  45.4  PLT  --  181  --  162  APTT  --   --  87*  --   HEPARINUNFRC  --   --  0.22* 0.30  CREATININE 1.40*  --   --  1.75*    Assessment: 68yo male now therapeutic on heparin though at very low end of goal; no gtt issues or signs of bleeding per RN.  Goal of Therapy:  Heparin level 0.3-0.7 units/ml   Plan:  Will increase heparin gtt slightly to 1200 units/hr and check level in 8 hours.    Wynona Neat, PharmD, BCPS  04/23/2019,3:11 AM

## 2019-04-23 NOTE — Progress Notes (Addendum)
Progress Note    04/23/2019 8:02 AM Hospital Day 1  Subjective:  C/o right foot pain.  Afebrile HR 50's-70's  662'H-476'L systolic 46% [id number] (pt on home O2)   Vitals:   04/22/19 1930 04/23/19 0316  BP: 132/70 124/62  Pulse: 74 (!) 58  Resp:  19  Temp: 98 F (36.7 C) 97.6 F (36.4 C)  SpO2: 90% 92%    Physical Exam: [place]:  No distress Lungs:  Non labored on oxygen   CBC    Component Value Date/Time   WBC 7.4 04/23/2019 0222   RBC 4.82 04/23/2019 0222   HGB 14.6 04/23/2019 0222   HCT 45.4 04/23/2019 0222   PLT 162 04/23/2019 0222   MCV 94.2 04/23/2019 0222   MCH 30.3 04/23/2019 0222   MCHC 32.2 04/23/2019 0222   RDW 15.5 04/23/2019 0222   LYMPHSABS 2.0 11/20/2018 1927   MONOABS 0.8 11/20/2018 1927   EOSABS 0.0 11/20/2018 1927   BASOSABS 0.0 11/20/2018 1927    BMET    Component Value Date/Time   NA 140 04/23/2019 0222   K 4.5 04/23/2019 0222   CL 109 04/23/2019 0222   CO2 25 04/23/2019 0222   GLUCOSE 120 (H) 04/23/2019 0222   BUN 27 (H) 04/23/2019 0222   CREATININE 1.75 (H) 04/23/2019 0222   CREATININE 0.99 11/12/2014 1344   CALCIUM 8.8 (L) 04/23/2019 0222   GFRNONAA 39 (L) 04/23/2019 0222   GFRAA 46 (L) 04/23/2019 0222    INR    Component Value Date/Time   INR 1.13 11/25/2018 0741     Intake/Output Summary (Last 24 hours) at 04/23/2019 0802 Last data filed at 04/23/2019 0400 Gross per 24 hour  Intake 759.81 ml  Output 350 ml  Net 409.81 ml     Assessment/Plan:  68 y.o. male admitted yesterday with severe rest pain right foot Hospital Day 1  -pt needs redo RLE bypass surgery.  Vein harvested in the past for previous bypass.  Vein map reveals adequate vein in the left leg.  Will also look at [facility] in the OR with u/s.  Will also need revision of left femoral anastomosis for psa.   -pt considered high risk for surgery due to co-morbidities, however, he has known normal coronary arteries without chest pain and no signs of fluid overload.   -plan for OR tomorrow with Dr. Donnetta Hutching -npo after MN/consent -AKI with creatinine of 1.75 up from 1.4 2 days ago and that was up from 1.03 in February.   Will discontinue lisinopril   ? Increase IVF from 30cc/hr -check labs in am  Leontine Locket, [place] [facility] [phone] 04/23/2019 8:02 AM  I have examined the patient, reviewed and agree with above. Appreciate Dr. Francesca Oman cardiology review.  Vein map reviewed.  This vein appears adequate and left thigh and become smaller below the knee.  Small saphenous vein was not imaged.  Discussed plan with the patient.  He has critical limb ischemia.  Explained he will require a redo right femoral to peroneal bypass.  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  Splane limitation and potential failure for him distal bypass.  Patient understands that he does not have adequate flow for limb salvage without bypass.  [name] is unavailable for surgery for over 1 week.  I have time and will proceed with surgery tomorrow patient understands and agrees  Curt Jews, MD 04/23/2019 8:55 AM

## 2019-04-24 ENCOUNTER — Inpatient Hospital Stay (HOSPITAL_COMMUNITY): Payer: Medicare HMO | Admitting: Certified Registered Nurse Anesthetist

## 2019-04-24 ENCOUNTER — Encounter (HOSPITAL_COMMUNITY): Payer: Self-pay | Admitting: Vascular Surgery

## 2019-04-24 ENCOUNTER — Encounter (HOSPITAL_COMMUNITY)
Admission: EM | Disposition: A | Discharge status: Home / Self Care | Payer: Self-pay | Source: Home / Self Care | Attending: Vascular Surgery

## 2019-04-24 DIAGNOSIS — I998 Other disorder of circulatory system: Secondary | ICD-10-CM

## 2019-04-24 DIAGNOSIS — I724 Aneurysm of artery of lower extremity: Secondary | ICD-10-CM

## 2019-04-24 DIAGNOSIS — T82898A Other specified complication of vascular prosthetic devices, implants and grafts, initial encounter: Secondary | ICD-10-CM

## 2019-04-24 HISTORY — PX: VEIN HARVEST: SHX6363

## 2019-04-24 HISTORY — PX: FEMORAL-TIBIAL BYPASS GRAFT: SHX938

## 2019-04-24 HISTORY — PX: FALSE ANEURYSM REPAIR: SHX5152

## 2019-04-24 LAB — BASIC METABOLIC PANEL
Anion gap: 8 (ref 5–15)
BUN: 19 mg/dL (ref 8–23)
CO2: 24 mmol/L (ref 22–32)
Calcium: 9.1 mg/dL (ref 8.9–10.3)
Chloride: 106 mmol/L (ref 98–111)
Creatinine, Ser: 1.44 mg/dL — ABNORMAL HIGH (ref 0.61–1.24)
GFR calc Af Amer: 58 mL/min — ABNORMAL LOW (ref >60)
GFR calc non Af Amer: 50 mL/min — ABNORMAL LOW (ref >60)
Glucose, Bld: 102 mg/dL — ABNORMAL HIGH (ref 70–99)
Potassium: 4.7 mmol/L (ref 3.5–5.1)
Sodium: 138 mmol/L (ref 135–145)

## 2019-04-24 LAB — GLUCOSE, CAPILLARY
Glucose-Capillary: 139 mg/dL — ABNORMAL HIGH (ref 70–99)
Glucose-Capillary: 66 mg/dL — ABNORMAL LOW (ref 70–99)
Glucose-Capillary: 90 mg/dL (ref 70–99)
Glucose-Capillary: 202 mg/dL — ABNORMAL HIGH (ref 70–99)

## 2019-04-24 LAB — TYPE AND SCREEN
ABO/RH(D): O POS
Antibody Screen: NEGATIVE

## 2019-04-24 LAB — CBC
HCT: 46.4 % (ref 39.0–52.0)
Hemoglobin: 15.2 g/dL (ref 13.0–17.0)
MCH: 30.4 pg (ref 26.0–34.0)
MCHC: 32.8 g/dL (ref 30.0–36.0)
MCV: 92.8 fL (ref 80.0–100.0)
Platelets: 172 10*3/uL (ref 150–400)
RBC: 5 MIL/uL (ref 4.22–5.81)
RDW: 15.2 % (ref 11.5–15.5)
WBC: 7.5 10*3/uL (ref 4.0–10.5)
nRBC: 0 % (ref 0.0–0.2)

## 2019-04-24 LAB — HEPARIN LEVEL (UNFRACTIONATED): Heparin Unfractionated: 0.47 IU/mL (ref 0.30–0.70)

## 2019-04-24 SURGERY — CREATION, BYPASS, ARTERIAL, FEMORAL TO TIBIAL, USING GRAFT
Anesthesia: General | Laterality: Right

## 2019-04-24 MED ORDER — DEXMEDETOMIDINE HCL 200 MCG/2ML IV SOLN
INTRAVENOUS | Status: DC | PRN
  Administered 2019-04-24: 8 ug via INTRAVENOUS
  Administered 2019-04-24: 4 ug via INTRAVENOUS
  Administered 2019-04-24 (×2): 8 ug via INTRAVENOUS

## 2019-04-24 MED ORDER — DOCUSATE SODIUM 100 MG PO CAPS
100.0000 mg | ORAL_CAPSULE | Freq: Every day | ORAL | Status: DC
  Administered 2019-04-25 – 2019-04-26 (×2): 100 mg via ORAL
  Filled 2019-04-24 (×2): qty 1

## 2019-04-24 MED ORDER — MEPERIDINE HCL 25 MG/ML IJ SOLN: 6.2500 mg | INTRAMUSCULAR | Status: DC | PRN

## 2019-04-24 MED ORDER — ROCURONIUM BROMIDE 10 MG/ML (PF) SYRINGE
PREFILLED_SYRINGE | INTRAVENOUS | Status: AC
  Filled 2019-04-24: qty 10

## 2019-04-24 MED ORDER — HEPARIN SODIUM (PORCINE) 5000 UNIT/ML IJ SOLN
5000.0000 [IU] | Freq: Three times a day (TID) | INTRAMUSCULAR | Status: DC
  Administered 2019-04-25 – 2019-04-26 (×4): 5000 [IU] via SUBCUTANEOUS
  Filled 2019-04-24 (×3): qty 1

## 2019-04-24 MED ORDER — ESMOLOL HCL 100 MG/10ML IV SOLN
INTRAVENOUS | Status: DC | PRN
  Administered 2019-04-24: 30 mg via INTRAVENOUS

## 2019-04-24 MED ORDER — SODIUM CHLORIDE 0.9 % IV SOLN
INTRAVENOUS | Status: AC
  Filled 2019-04-24: qty 1.2

## 2019-04-24 MED ORDER — SODIUM CHLORIDE 0.9 % IV SOLN: 500.0000 mL | Freq: Once | INTRAVENOUS | Status: DC | PRN

## 2019-04-24 MED ORDER — ONDANSETRON HCL 4 MG/2ML IJ SOLN
INTRAMUSCULAR | Status: AC
  Filled 2019-04-24: qty 2

## 2019-04-24 MED ORDER — LIDOCAINE 2% (20 MG/ML) 5 ML SYRINGE
INTRAMUSCULAR | Status: AC
  Filled 2019-04-24: qty 5

## 2019-04-24 MED ORDER — LACTATED RINGERS IV SOLN: INTRAVENOUS | Status: DC

## 2019-04-24 MED ORDER — ALBUTEROL SULFATE (2.5 MG/3ML) 0.083% IN NEBU
INHALATION_SOLUTION | RESPIRATORY_TRACT | Status: AC
  Filled 2019-04-24: qty 3

## 2019-04-24 MED ORDER — PROMETHAZINE HCL 25 MG/ML IJ SOLN: 6.2500 mg | INTRAMUSCULAR | Status: DC | PRN

## 2019-04-24 MED ORDER — FENTANYL CITRATE (PF) 250 MCG/5ML IJ SOLN
INTRAMUSCULAR | Status: AC
  Filled 2019-04-24: qty 5

## 2019-04-24 MED ORDER — ROCURONIUM BROMIDE 10 MG/ML (PF) SYRINGE
PREFILLED_SYRINGE | INTRAVENOUS | Status: DC | PRN
  Administered 2019-04-24: 20 mg via INTRAVENOUS
  Administered 2019-04-24 (×2): 10 mg via INTRAVENOUS
  Administered 2019-04-24: 60 mg via INTRAVENOUS

## 2019-04-24 MED ORDER — DEXAMETHASONE SODIUM PHOSPHATE 10 MG/ML IJ SOLN
INTRAMUSCULAR | Status: DC | PRN
  Administered 2019-04-24: 4 mg via INTRAVENOUS

## 2019-04-24 MED ORDER — POTASSIUM CHLORIDE CRYS ER 20 MEQ PO TBCR: 20.0000 meq | EXTENDED_RELEASE_TABLET | Freq: Every day | ORAL | Status: DC | PRN

## 2019-04-24 MED ORDER — FENTANYL CITRATE (PF) 100 MCG/2ML IJ SOLN: 25.0000 ug | INTRAMUSCULAR | Status: DC | PRN

## 2019-04-24 MED ORDER — FENTANYL CITRATE (PF) 250 MCG/5ML IJ SOLN
INTRAMUSCULAR | Status: DC | PRN
  Administered 2019-04-24 (×2): 50 ug via INTRAVENOUS
  Administered 2019-04-24 (×2): 25 ug via INTRAVENOUS
  Administered 2019-04-24: 100 ug via INTRAVENOUS
  Administered 2019-04-24: 50 ug via INTRAVENOUS
  Administered 2019-04-24: 25 ug via INTRAVENOUS
  Administered 2019-04-24: 50 ug via INTRAVENOUS
  Administered 2019-04-24 (×2): 25 ug via INTRAVENOUS

## 2019-04-24 MED ORDER — SODIUM CHLORIDE 0.9 % IV SOLN
INTRAVENOUS | Status: DC
  Administered 2019-04-24 – 2019-04-25 (×2): via INTRAVENOUS

## 2019-04-24 MED ORDER — ACETAMINOPHEN 10 MG/ML IV SOLN: 1000.0000 mg | Freq: Once | INTRAVENOUS | Status: DC | PRN

## 2019-04-24 MED ORDER — SODIUM CHLORIDE 0.9 % IV SOLN
INTRAVENOUS | Status: DC | PRN
  Administered 2019-04-24: 20 ug/min via INTRAVENOUS

## 2019-04-24 MED ORDER — ACETAMINOPHEN 325 MG PO TABS: 325.0000 mg | ORAL_TABLET | Freq: Once | ORAL | Status: DC | PRN

## 2019-04-24 MED ORDER — ACETAMINOPHEN 160 MG/5ML PO SOLN: 325.0000 mg | Freq: Once | ORAL | Status: DC | PRN

## 2019-04-24 MED ORDER — DEXTROSE 50 % IV SOLN
25.0000 mL | Freq: Once | INTRAVENOUS | Status: AC
  Administered 2019-04-24: 25 mL via INTRAVENOUS
  Filled 2019-04-24: qty 50

## 2019-04-24 MED ORDER — DEXAMETHASONE SODIUM PHOSPHATE 10 MG/ML IJ SOLN
INTRAMUSCULAR | Status: AC
  Filled 2019-04-24: qty 1

## 2019-04-24 MED ORDER — SUGAMMADEX SODIUM 200 MG/2ML IV SOLN
INTRAVENOUS | Status: DC | PRN
  Administered 2019-04-24: 200 mg via INTRAVENOUS

## 2019-04-24 MED ORDER — MIDAZOLAM HCL 2 MG/2ML IJ SOLN
INTRAMUSCULAR | Status: AC
  Filled 2019-04-24: qty 2

## 2019-04-24 MED ORDER — HYDRALAZINE HCL 20 MG/ML IJ SOLN
INTRAMUSCULAR | Status: AC
  Filled 2019-04-24: qty 1

## 2019-04-24 MED ORDER — PROPOFOL 10 MG/ML IV BOLUS
INTRAVENOUS | Status: DC | PRN
  Administered 2019-04-24: 100 mg via INTRAVENOUS

## 2019-04-24 MED ORDER — LIDOCAINE 2% (20 MG/ML) 5 ML SYRINGE
INTRAMUSCULAR | Status: DC | PRN
  Administered 2019-04-24: 60 mg via INTRAVENOUS
  Administered 2019-04-24: 40 mg via INTRAVENOUS

## 2019-04-24 MED ORDER — PROTAMINE SULFATE 10 MG/ML IV SOLN
INTRAVENOUS | Status: DC | PRN
  Administered 2019-04-24: 50 mg via INTRAVENOUS

## 2019-04-24 MED ORDER — ALBUMIN HUMAN 5 % IV SOLN
INTRAVENOUS | Status: DC | PRN
  Administered 2019-04-24 (×2): via INTRAVENOUS

## 2019-04-24 MED ORDER — PROPOFOL 10 MG/ML IV BOLUS
INTRAVENOUS | Status: AC
  Filled 2019-04-24: qty 20

## 2019-04-24 MED ORDER — CLINDAMYCIN PHOSPHATE 900 MG/50ML IV SOLN
INTRAVENOUS | Status: AC
  Filled 2019-04-24: qty 50

## 2019-04-24 MED ORDER — HEPARIN SODIUM (PORCINE) 1000 UNIT/ML IJ SOLN
INTRAMUSCULAR | Status: DC | PRN
  Administered 2019-04-24: 7000 [IU] via INTRAVENOUS

## 2019-04-24 MED ORDER — ONDANSETRON HCL 4 MG/2ML IJ SOLN
INTRAMUSCULAR | Status: DC | PRN
  Administered 2019-04-24: 4 mg via INTRAVENOUS

## 2019-04-24 MED ORDER — CEFAZOLIN SODIUM-DEXTROSE 2-4 GM/100ML-% IV SOLN: 2.0000 g | Freq: Three times a day (TID) | INTRAVENOUS | Status: DC

## 2019-04-24 MED ORDER — SODIUM CHLORIDE 0.9 % IV SOLN
INTRAVENOUS | Status: DC | PRN
  Administered 2019-04-24: 500 mL

## 2019-04-24 MED ORDER — LACTATED RINGERS IV SOLN
INTRAVENOUS | Status: DC | PRN
  Administered 2019-04-24: 10:00:00 via INTRAVENOUS

## 2019-04-24 MED ORDER — ALBUTEROL SULFATE (2.5 MG/3ML) 0.083% IN NEBU
2.5000 mg | INHALATION_SOLUTION | Freq: Four times a day (QID) | RESPIRATORY_TRACT | Status: DC | PRN
  Administered 2019-04-24: 2.5 mg via RESPIRATORY_TRACT

## 2019-04-24 MED ORDER — MAGNESIUM SULFATE 2 GM/50ML IV SOLN: 2.0000 g | Freq: Every day | INTRAVENOUS | Status: DC | PRN

## 2019-04-24 MED ORDER — 0.9 % SODIUM CHLORIDE (POUR BTL) OPTIME
TOPICAL | Status: DC | PRN
  Administered 2019-04-24 (×2): 1000 mL

## 2019-04-24 SURGICAL SUPPLY — 74 items
BAG ISOLATION DRAPE 18X18 (DRAPES) ×2 IMPLANT
BANDAGE ESMARK 6X9 LF (GAUZE/BANDAGES/DRESSINGS) ×2 IMPLANT
BNDG ESMARK 6X9 LF (GAUZE/BANDAGES/DRESSINGS) ×3
CANISTER SUCT 3000ML PPV (MISCELLANEOUS) ×6 IMPLANT
CANNULA VESSEL 3MM 2 BLNT TIP (CANNULA) ×6 IMPLANT
CLIP LIGATING EXTRA MED SLVR (CLIP) ×6 IMPLANT
CLIP LIGATING EXTRA SM BLUE (MISCELLANEOUS) ×9 IMPLANT
COVER PROBE W GEL 5X96 (DRAPES) ×3 IMPLANT
COVER WAND RF STERILE (DRAPES) IMPLANT
CUFF TOURN SGL QUICK 24 (TOURNIQUET CUFF) ×1
CUFF TOURN SGL QUICK 34 (TOURNIQUET CUFF)
CUFF TOURNIQUET SINGLE 44IN (TOURNIQUET CUFF) IMPLANT
CUFF TRNQT CYL 24X4X16.5-23 (TOURNIQUET CUFF) ×2 IMPLANT
CUFF TRNQT CYL 34X4.125X (TOURNIQUET CUFF) IMPLANT
DERMABOND ADVANCED (GAUZE/BANDAGES/DRESSINGS) ×2
DERMABOND ADVANCED .7 DNX12 (GAUZE/BANDAGES/DRESSINGS) ×4 IMPLANT
DRAIN SNY 10X20 3/4 PERF (WOUND CARE) IMPLANT
DRAPE HALF SHEET 40X57 (DRAPES) IMPLANT
DRAPE ISOLATION BAG 18X18 (DRAPES) ×1
DRAPE ORTHO SPLIT 77X108 STRL (DRAPES) ×1
DRAPE SURG ORHT 6 SPLT 77X108 (DRAPES) ×2 IMPLANT
DRAPE X-RAY CASS 24X20 (DRAPES) IMPLANT
ELECT CAUTERY BLADE 6.4 (BLADE) ×3 IMPLANT
ELECT REM PT RETURN 9FT ADLT (ELECTROSURGICAL) ×3
ELECTRODE REM PT RTRN 9FT ADLT (ELECTROSURGICAL) ×2 IMPLANT
EVACUATOR SILICONE 100CC (DRAIN) IMPLANT
GLOVE BIO SURGEON STRL SZ 6.5 (GLOVE) ×6 IMPLANT
GLOVE BIO SURGEON STRL SZ7.5 (GLOVE) ×9 IMPLANT
GLOVE BIOGEL PI IND STRL 6.5 (GLOVE) ×14 IMPLANT
GLOVE BIOGEL PI IND STRL 7.5 (GLOVE) ×2 IMPLANT
GLOVE BIOGEL PI IND STRL 8 (GLOVE) ×4 IMPLANT
GLOVE BIOGEL PI INDICATOR 6.5 (GLOVE) ×7
GLOVE BIOGEL PI INDICATOR 7.5 (GLOVE) ×1
GLOVE BIOGEL PI INDICATOR 8 (GLOVE) ×2
GLOVE SS BIOGEL STRL SZ 7.5 (GLOVE) ×2 IMPLANT
GLOVE SUPERSENSE BIOGEL SZ 7.5 (GLOVE) ×1
GLOVE SURG SS PI 6.5 STRL IVOR (GLOVE) ×6 IMPLANT
GOWN STRL NON-REIN LRG LVL3 (GOWN DISPOSABLE) ×6 IMPLANT
GOWN STRL REUS W/ TWL LRG LVL3 (GOWN DISPOSABLE) ×10 IMPLANT
GOWN STRL REUS W/TWL LRG LVL3 (GOWN DISPOSABLE) ×5
GRAFT HEMASHIELD 8MM (Vascular Products) ×1 IMPLANT
GRAFT VASC STRG 30X8KNIT (Vascular Products) ×2 IMPLANT
INSERT FOGARTY SM (MISCELLANEOUS) IMPLANT
KIT BASIN OR (CUSTOM PROCEDURE TRAY) ×3 IMPLANT
KIT TURNOVER KIT B (KITS) ×3 IMPLANT
NS IRRIG 1000ML POUR BTL (IV SOLUTION) ×6 IMPLANT
PACK PERIPHERAL VASCULAR (CUSTOM PROCEDURE TRAY) ×3 IMPLANT
PAD ARMBOARD 7.5X6 YLW CONV (MISCELLANEOUS) ×6 IMPLANT
PAD CAST 4YDX4 CTTN HI CHSV (CAST SUPPLIES) ×2 IMPLANT
PADDING CAST COTTON 4X4 STRL (CAST SUPPLIES) ×1
PADDING CAST COTTON 6X4 STRL (CAST SUPPLIES) IMPLANT
SET COLLECT BLD 21X3/4 12 (NEEDLE) IMPLANT
SET COLLECT BLD 21X3/4 12 PB (MISCELLANEOUS) IMPLANT
SPONGE LAP 18X18 RF (DISPOSABLE) ×6 IMPLANT
STOCKINETTE 6  STRL (DRAPES) ×1
STOCKINETTE 6 STRL (DRAPES) ×2 IMPLANT
STOPCOCK 4 WAY LG BORE MALE ST (IV SETS) IMPLANT
SUT ETHILON 3 0 PS 1 (SUTURE) IMPLANT
SUT PROLENE 5 0 C 1 24 (SUTURE) ×15 IMPLANT
SUT PROLENE 6 0 CC (SUTURE) ×9 IMPLANT
SUT SILK 2 0 SH (SUTURE) ×3 IMPLANT
SUT SILK 3 0 (SUTURE) ×1
SUT SILK 3-0 18XBRD TIE 12 (SUTURE) ×2 IMPLANT
SUT SILK 4 0 (SUTURE) ×1
SUT SILK 4-0 18XBRD TIE 12 (SUTURE) ×2 IMPLANT
SUT VIC AB 2-0 CTX 36 (SUTURE) ×6 IMPLANT
SUT VIC AB 3-0 SH 27 (SUTURE) ×8
SUT VIC AB 3-0 SH 27X BRD (SUTURE) ×16 IMPLANT
SUT VIC AB 4-0 PS2 18 (SUTURE) ×6 IMPLANT
TOWEL GREEN STERILE (TOWEL DISPOSABLE) ×3 IMPLANT
TRAY FOLEY MTR SLVR 16FR STAT (SET/KITS/TRAYS/PACK) ×3 IMPLANT
TUBING EXTENTION W/L.L. (IV SETS) IMPLANT
UNDERPAD 30X30 (UNDERPADS AND DIAPERS) ×9 IMPLANT
WATER STERILE IRR 1000ML POUR (IV SOLUTION) ×3 IMPLANT

## 2019-04-24 NOTE — Anesthesia Postprocedure Evaluation (Signed)
Anesthesia Post Note  Patient: Kyle [name]  Procedure(s) Performed: REDO BYPASS GRAFT RIGHT FEMORAL-TIBIAL ARTERY USING LEFT LEG VEIN & HEMASHIELD GOLD 21mm GRAFT (Right ) REPAIR OF RIGHT FEMORAL ARTERY PSEUDOANEURYSM (Right ) LEFT LEG SAPHENOUS VEIN HARVEST (Left )     Patient location during evaluation: PACU Anesthesia Type: General Level of consciousness: awake and alert Pain management: pain level controlled Vital Signs Assessment: post-procedure vital signs reviewed and stable Respiratory status: spontaneous breathing, nonlabored ventilation, respiratory function stable and patient connected to nasal cannula oxygen Cardiovascular status: blood pressure returned to baseline and stable Postop Assessment: no apparent nausea or vomiting Anesthetic complications: no    Last Vitals:  Vitals:   04/24/19 1650 04/24/19 1741  BP: (!) 169/74   Pulse: 65 82  Resp: 16 17  Temp: 36.5 C   SpO2: 92% (!) 86%                 Effie Berkshire

## 2019-04-24 NOTE — Anesthesia Preprocedure Evaluation (Addendum)
Anesthesia Evaluation  Patient identified by MRN, date of birth, ID band Patient awake    Reviewed: Allergy & Precautions, NPO status , Patient's Chart, lab work & pertinent test results  Airway Mallampati: II  TM Distance: >3 FB Neck ROM: Full    Dental  (+) Edentulous Upper, Edentulous Lower   Pulmonary COPD, former smoker,     + decreased breath sounds      Cardiovascular hypertension, + Peripheral Vascular Disease and +CHF  + Valvular Problems/Murmurs AI  Rhythm:Regular Rate:Normal     Neuro/Psych negative neurological ROS  negative psych ROS   GI/Hepatic negative GI ROS, Neg liver ROS,   Endo/Other    Renal/GU CRFRenal disease     Musculoskeletal negative musculoskeletal ROS (+)   Abdominal Normal abdominal exam  (+)   Peds  Hematology negative hematology ROS (+)   Anesthesia Other Findings   Reproductive/Obstetrics                           Lab Results  Component Value Date   WBC 7.5 04/24/2019   HGB 15.2 04/24/2019   HCT 46.4 04/24/2019   MCV 92.8 04/24/2019   PLT 172 04/24/2019   Lab Results  Component Value Date   CREATININE 1.44 (H) 04/24/2019   BUN 19 04/24/2019   NA 138 04/24/2019   K 4.7 04/24/2019   CL 106 04/24/2019   CO2 24 04/24/2019   Echo: - Left ventricle: The cavity size was normal. Systolic function was   mildly to moderately reduced. The estimated ejection fraction was   in the range of 40% to 45%. Diffuse hypokinesis. The study is not   technically sufficient to allow evaluation of LV diastolic   function. - Ventricular septum: The contour showed diastolic flattening. - Aortic valve: Valve mobility was restricted. There was mild   stenosis. There was moderate regurgitation. - Right ventricle: The cavity size was moderately dilated. Wall   thickness was normal. Systolic function was moderately reduced. - Right atrium: The atrium was severely  dilated. - Tricuspid valve: There was moderate regurgitation. - Pulmonary arteries: Systolic pressure was moderately increased.   PA peak pressure: 53 mm Hg (S).  Anesthesia Physical Anesthesia Plan  ASA: III  Anesthesia Plan: General   Post-op Pain Management:    Induction: Intravenous  PONV Risk Score and Plan: 3 and Ondansetron, Dexamethasone and Midazolam  Airway Management Planned: Oral ETT  Additional Equipment: Arterial line  Intra-op Plan:   Post-operative Plan: Extubation in OR  Informed Consent: I have reviewed the patients History and Physical, chart, labs and discussed the procedure including the risks, benefits and alternatives for the proposed anesthesia with the patient or authorized representative who has indicated his/her understanding and acceptance.       Plan Discussed with: CRNA  Anesthesia Plan Comments:        Anesthesia Quick Evaluation

## 2019-04-24 NOTE — Transfer of Care (Signed)
Immediate Anesthesia Transfer of Care Note  Patient: NAVEED HUMPHRES  Procedure(s) Performed: REDO BYPASS GRAFT RIGHT FEMORAL-TIBIAL ARTERY USING LEFT LEG VEIN & HEMASHIELD GOLD 46mm GRAFT (Right ) REPAIR OF RIGHT FEMORAL ARTERY PSEUDOANEURYSM (Right ) LEFT LEG SAPHENOUS VEIN HARVEST (Left )  Patient Location: PACU  Anesthesia Type:General  Level of Consciousness: awake  Airway & Oxygen Therapy: Patient Spontanous Breathing and Patient connected to face mask oxygen  Post-op Assessment: Report given to RN and Post -op Vital signs reviewed and stable  Post vital signs: Reviewed and stable  Last Vitals:  Vitals Value Taken Time  BP 156/91 04/24/19 1525  Temp    Pulse 75 04/24/19 1529  Resp 22 04/24/19 1529  SpO2 91 % 04/24/19 1529  Vitals shown include unvalidated device data.  Last Pain:  Vitals:   04/24/19 0747  TempSrc: Oral  PainSc: 1          Complications: No apparent anesthesia complications

## 2019-04-24 NOTE — Anesthesia Procedure Notes (Signed)
Arterial Line Insertion Start/End6/29/2020 9:55 AM, 04/24/2019 10:00 AM Performed by: Effie Berkshire, MD, anesthesiologist  Patient location: Pre-op. Preanesthetic checklist: patient identified, IV checked, site marked, risks and benefits discussed, surgical consent, monitors and equipment checked, pre-op evaluation, timeout performed and anesthesia consent Lidocaine 1% used for infiltration Left, radial was placed Catheter size: 20 Fr Hand hygiene performed  and maximum sterile barriers used   Attempts: 1 Procedure performed without using ultrasound guided technique. Following insertion, dressing applied and Biopatch. Post procedure assessment: normal and unchanged  Patient tolerated the procedure well with no immediate complications.

## 2019-04-24 NOTE — Op Note (Signed)
OPERATIVE REPORT  DATE OF SURGERY: 04/24/2019  PATIENT: Kyle [name], 68 y.o. male MRN: [id number]  DOB: 11/06/1950  PRE-OPERATIVE DIAGNOSIS: #1 critical limb ischemia right leg, #2 right femoral false aneurysm  POST-OPERATIVE DIAGNOSIS:  Same  PROCEDURE: #1 right femoral and repair with interposition 8 mm Hemashield graft from prior right limb of aortofemoral bypass into into deep femoral artery, #2 right femoral to peroneal bypass with translocated non-reversed great saphenous vein harvested from the left leg  SURGEON:  Curt Jews, M.D.  PHYSICIAN ASSISTANT: Dr. Gae Gallop, Arlee Muslim, PA-C  ANESTHESIA: Neural  EBL: per anesthesia record  Total I/O In: 1500 [I.V.:1000; IV Piggyback:500] Out: 660 [Urine:510; Blood:150]  BLOOD ADMINISTERED: none  DRAINS: none  SPECIMEN: none  COUNTS CORRECT:  YES  PATIENT DISPOSITION:  PACU - hemodynamically stable  PROCEDURE DETAILS: Patient was taken the operating placed supine position where the area of both groins and both legs were prepped and draped in usual sterile fashion.  The right arm was also prepped and draped in usual sterile fashion.  SonoSite was used to image the right cephalic vein which was of good caliber throughout its course.  The patient did have a patent left great saphenous vein from the groin to the mid calf.  It was somewhat small distally.  Incision was made over the right groin and carried down to isolate the false aneurysm.  The old limb of the aortofemoral graft was exposed for control.  The old vein graft was exposed and was chronically thrombosed.  Superficial femoral artery was chronically occluded.  Patient had a large deep femoral artery and it was isolated with a vessel loop.  Attention was then turned to the medial approach to the peroneal artery and mid calf.  The gastrocnemius muscle was reflected posteriorly and the posterior tibial and peroneal arteries were identified.  The peroneal artery  was of moderate size with minimal sclerotic change.  Next the left leg great saphenous vein was harvested from the groin to the distal calf.  Tributary branches were ligated with 3-0 and 4-0 silk ties and divided.  Skin bridges were left on the vein harvest.  The vein was ligated distally and divided.  This was then cannulated with a vessel cannula and was gently dilated.  The vein was somewhat small but was felt to be adequate for bypass.  The vein was marked to reduce risk of twisting.  A tunnel was created from the level of the peroneal artery to the right groin with a straight Gore tunneler.  Patient was given 7000 intravenous heparin.  After adequate circulation time the right limb of the aortofemoral graft was occluded with a Henley clamp.  The deep femoral artery was occluded with a baby Gregory clamp.  The false aneurysm was opened.  There was no inflow with a chronically occluded external iliac at the inguinal ligament and the supra femoral artery was chronically occluded as well.  The aneurysm was resected.  The right limb of the aortofemoral graft was transected.  The deep femoral artery was also transected and spatulated and was of good caliber.  A new interposition of 8 mm Sharyn Lull was brought onto the field and was sewn into into the old limb of the graft with a running 5-0 Prolene suture.  The Hemashield graft was cut to the appropriate length and was sewn into into the deep femoral artery after spatulation of the graft.  This was also with a running 5-0 Prolene suture.  The  anastomosis was tested and found to be adequate.  Next a small ellipse of graft was removed from the hood of the graft and the vein harvested from the left leg was brought onto the field.  The proximal portion of the vein was spatulated and sewn end-to-side to the hood of the femoral Hemashield graft with a running 6-0 Prolene suture.  This anastomosis was tested and found to be adequate.  The vein valves were lysed with a Mills  valvulotome giving good flow through the vein graft.  The vein was brought through the prior created tunnel taking care not to twist the vein.  The pneumatic tourniquet was placed in the above-knee position.  The leg was exsanguinated with an Esmarch tourniquet and the pneumatic tourniquet was inflated to 250 mmHg.  The peroneal artery was opened and had minimal atherosclerotic change.  A 2 dilator passed easily through the lumen of the peroneal artery.  The vein graft was cut to the appropriate length and was spatulated and sewn end-to-side to the artery with a running 6-0 Prolene suture.  Prior to completion of the closure 2 mm dilator again was passed proximally and distally through the vein and through the anastomosis with no evidence of stenosis.  The anastomosis was completed and the pneumatic it was tourniquet was deflated.  There was good graft dependent Doppler flow at the peroneal artery at the ankle.  The patient was given 50 mg of protamine reverse heparin.  Wounds were irrigated with saline.  Hemostasis cautery.  Wounds were closed with 2-0 Vicryl in several layers in the groin and 3-0 Vicryl in the skin.  The vein harvest and perineal incisions were closed with 3-0 Vicryl in the subcutaneous and subcuticular tissue.  Sterile dressing was applied the patient was transferred to the recovery room in stable condition   Rosetta Posner, M.D., [facility] 04/24/2019 4:18 PM

## 2019-04-24 NOTE — Anesthesia Procedure Notes (Signed)
Procedure Name: Intubation Performed by: Milford Cage, CRNA Pre-anesthesia Checklist: Patient identified, Emergency Drugs available, Suction available and Patient being monitored Patient Re-evaluated:Patient Re-evaluated prior to induction Oxygen Delivery Method: Circle System Utilized Preoxygenation: Pre-oxygenation with 100% oxygen Induction Type: IV induction Ventilation: Mask ventilation without difficulty and Oral airway inserted - appropriate to patient size Laryngoscope Size: Mac and 3 Grade View: Grade II Tube type: Oral Tube size: 7.5 mm Number of attempts: 1 Airway Equipment and Method: Stylet and Oral airway Placement Confirmation: ETT inserted through vocal cords under direct vision,  positive ETCO2 and breath sounds checked- equal and bilateral Secured at: 22 cm Tube secured with: Tape Dental Injury: Teeth and Oropharynx as per pre-operative assessment

## 2019-04-24 NOTE — Progress Notes (Addendum)
CBG 66. Asymptomatic. Pt NPO. Will give 25 ml D50 and recheck in 15 minutes.

## 2019-04-24 NOTE — Progress Notes (Signed)
[name] PA notified of patient's absent dorsal pedis pulse  In left foot but present posterior tibialis pulse in left foot.

## 2019-04-24 NOTE — Progress Notes (Signed)
Pt received from PACU. Pt has A line, zeroed and calibrated. Pt C/AOx4. Denies pain. Vitals stable. Will continue to monitor.  Jerald Kief, RN

## 2019-04-25 ENCOUNTER — Encounter (HOSPITAL_COMMUNITY): Payer: Self-pay | Admitting: Vascular Surgery

## 2019-04-25 LAB — BASIC METABOLIC PANEL
Anion gap: 8 (ref 5–15)
BUN: 20 mg/dL (ref 8–23)
CO2: 24 mmol/L (ref 22–32)
Calcium: 8.8 mg/dL — ABNORMAL LOW (ref 8.9–10.3)
Chloride: 107 mmol/L (ref 98–111)
Creatinine, Ser: 1.49 mg/dL — ABNORMAL HIGH (ref 0.61–1.24)
GFR calc Af Amer: 55 mL/min — ABNORMAL LOW (ref >60)
GFR calc non Af Amer: 48 mL/min — ABNORMAL LOW (ref >60)
Glucose, Bld: 138 mg/dL — ABNORMAL HIGH (ref 70–99)
Potassium: 4.6 mmol/L (ref 3.5–5.1)
Sodium: 139 mmol/L (ref 135–145)

## 2019-04-25 LAB — CBC
HCT: 41.2 % (ref 39.0–52.0)
Hemoglobin: 13.5 g/dL (ref 13.0–17.0)
MCH: 30.3 pg (ref 26.0–34.0)
MCHC: 32.8 g/dL (ref 30.0–36.0)
MCV: 92.4 fL (ref 80.0–100.0)
Platelets: 146 10*3/uL — ABNORMAL LOW (ref 150–400)
RBC: 4.46 MIL/uL (ref 4.22–5.81)
RDW: 15.3 % (ref 11.5–15.5)
WBC: 10.7 10*3/uL — ABNORMAL HIGH (ref 4.0–10.5)
nRBC: 0 % (ref 0.0–0.2)

## 2019-04-25 LAB — GLUCOSE, CAPILLARY: Glucose-Capillary: 128 mg/dL — ABNORMAL HIGH (ref 70–99)

## 2019-04-25 LAB — HEPARIN LEVEL (UNFRACTIONATED): Heparin Unfractionated: <0.1 IU/mL — ABNORMAL LOW (ref 0.30–0.70)

## 2019-04-25 NOTE — Evaluation (Signed)
Occupational Therapy Evaluation Patient Details Name: Kyle [name] MRN: [id number] DOB: 01/28/1951 Today's Date: 04/25/2019    History of Present Illness Pt is a 68 y.o. male s/p repeat R fem-pop bypass. PMH consists of CHF, HTN, COPD and previous R fem-pop bypass 2010.   Clinical Impression   This 69 yo male admitted with above presents to acute OT with all education completed, we will D/C from acute OT.     Follow Up Recommendations  Supervision/Assistance - 24 hour    Equipment Recommendations  None recommended by OT       Precautions / Restrictions Precautions Precautions: Other (comment) Precaution Comments: watch sats--drops into 80s with good wave form on 4 liters, into high 70's with variable wave form with activity Restrictions Weight Bearing Restrictions: No      Mobility Bed Mobility     General bed mobility comments: Pt up in recliner upon my arrival  Transfers Overall transfer level: Needs assistance Equipment used: None Transfers: Sit to/from Stand Sit to Stand: Min guard Stand pivot transfers: Min guard       General transfer comment: min guard A for ambulation around room without AD with sometimes "furniture" walking    Balance Overall balance assessment: Needs assistance Sitting-balance support: No upper extremity supported;Feet supported Sitting balance-Leahy Scale: Good     Standing balance support: No upper extremity supported;During functional activity Standing balance-Leahy Scale: Fair                             ADL either performed or assessed with clinical judgement   ADL                                         General ADL Comments: set up/S with minguard A sit<>stand and with functional ambulation for basic ADLs     Vision Patient Visual Report: No change from baseline              Pertinent Vitals/Pain Pain Assessment: 0-10 Faces Pain Scale: Hurts little more Pain Location: RLE Pain  Descriptors / Indicators: Tightness Pain Intervention(s): Limited activity within patient's tolerance;Monitored during session     Hand Dominance Right   Extremity/Trunk Assessment Upper Extremity Assessment Upper Extremity Assessment: Overall WFL for tasks assessed         Communication Communication Communication: No difficulties   Cognition Arousal/Alertness: Awake/alert Behavior During Therapy: WFL for tasks assessed/performed Overall Cognitive Status: Within Functional Limits for tasks assessed                                                Home Living Family/patient expects to be discharged to:: Private residence Living Arrangements: Spouse/significant other Available Help at Discharge: Family;Available 24 hours/day Type of Home: Apartment Home Access: Stairs to enter [employer] of Steps: 5 Entrance Stairs-Rails: Can reach both Home Layout: One level     Bathroom Shower/Tub: Corporate investment banker: Standard     Home Equipment: None   Additional Comments: On 2 L home O2      Prior Functioning/Environment Level of Independence: Independent                 OT Problem List: Decreased range of motion;Impaired  balance (sitting and/or standing);Pain         OT Goals(Current goals can be found in the care plan section) Acute Rehab OT Goals Patient Stated Goal: to go home  OT Frequency:                AM-PAC OT "6 Clicks" Daily Activity     Outcome Measure Help from another person eating meals?: None Help from another person taking care of personal grooming?: A Little Help from another person toileting, which includes using toliet, bedpan, or urinal?: A Little Help from another person bathing (including washing, rinsing, drying)?: A Little Help from another person to put on and taking off regular upper body clothing?: A Little Help from another person to put on and taking off regular lower body  clothing?: A Little 6 Click Score: 19   End of Session Equipment Utilized During Treatment: Rolling walker Nurse Communication: (Made MD aware via chat text about pt's sats)  Activity Tolerance: Patient tolerated treatment well Patient left: in chair;with call bell/phone within reach  OT Visit Diagnosis: Other abnormalities of gait and mobility (R26.89);Pain Pain - Right/Left: Left Pain - part of body: Leg                Time: 7096-2836 OT Time Calculation (min): 22 min Charges:  OT General Charges $OT Visit: 1 Visit OT Evaluation $OT Eval Moderate Complexity: 1 Mod [place], OTR/L Acute [employer] Pager [phone] Office [phone]     Almon Register 04/25/2019, 11:05 AM

## 2019-04-25 NOTE — Progress Notes (Signed)
[facility] will sign off.   Medication Recommendations:  See consult note for recommendations  Other recommendations (labs, testing, etc):  N/A Follow up as an outpatient:  Follow up in 6 weeks with Dr. Martinique or APP after discharge

## 2019-04-25 NOTE — Evaluation (Signed)
Physical Therapy Evaluation Patient Details Name: Kyle [name] MRN: [id number] DOB: [birth year] Today's Date: 04/25/2019   History of Present Illness  Pt is a 68 y.o. male s/p repeat R fem-pop bypass. PMH consists of CHF, HTN, COPD and previous R fem-pop bypass 2010.    Clinical Impression  Pt admitted with above diagnosis. Pt currently with functional limitations due to the deficits listed below (see PT Problem List). PTA pt lived at home with his wife, independent with ADLs and mobility. He wears 2 L home O2. On eval, he demonstrated modified independence bed mobility. He required min guard assist transfers and min guard assist ambulation 225 feet with RW. Pt able to ambulate in room at end of session min guard assist without AD. Pt on 4 L O2 at rest in room. Ambulated on 6 L with desat to 79%. Unsure of validity of reading as pt asymptomatic. SpO2 88% on 4 L at end of session.  Pt will benefit from skilled PT to increase their independence and safety with mobility to allow discharge to the venue listed below.       Follow Up Recommendations No PT follow up;Supervision for mobility/OOB    Equipment Recommendations  Cane    Recommendations for Other Services       Precautions / Restrictions Precautions Precautions: Other (comment) Precaution Comments: watch sats Restrictions Weight Bearing Restrictions: No      Mobility  Bed Mobility Overal bed mobility: Modified Independent             General bed mobility comments: HOB elevated, +rail  Transfers Overall transfer level: Needs assistance Equipment used: Ambulation equipment used Transfers: Sit to/from [employer] Transfers Sit to Stand: Min guard Stand pivot transfers: Min guard          Ambulation/Gait Ambulation/Gait assistance: Min guard Gait Distance (Feet): 225 Feet Assistive device: Rolling walker (2 wheeled) Gait Pattern/deviations: Antalgic;Decreased stride length;Decreased weight shift to  right Gait velocity: decreased Gait velocity interpretation: 1.31 - 2.62 ft/sec, indicative of limited community ambulator General Gait Details: improved fluidity of gait pattern with distance, as tightness in RLE improved. Pt able to ambulate without AD in room at end of session min guard assist.  Stairs            Wheelchair Mobility    Modified Rankin (Stroke Patients Only)       Balance Overall balance assessment: Needs assistance Sitting-balance support: No upper extremity supported;Feet supported Sitting balance-Leahy Scale: Normal     Standing balance support: No upper extremity supported;During functional activity Standing balance-Leahy Scale: Fair                               Pertinent Vitals/Pain Pain Assessment: Faces Faces Pain Scale: Hurts little more Pain Location: RLE Pain Descriptors / Indicators: Tightness Pain Intervention(s): Monitored during session;Repositioned    Home Living Family/patient expects to be discharged to:: Private residence Living Arrangements: Spouse/significant other Available Help at Discharge: Family;Available 24 hours/day Type of Home: Apartment Home Access: Stairs to enter Entrance Stairs-Rails: Can reach both Entrance Stairs-Number of Steps: 5 Home Layout: One level Home Equipment: None Additional Comments: On 2 L home O2    Prior Function Level of Independence: Independent               Hand Dominance   Dominant Hand: Right    Extremity/Trunk Assessment   Upper Extremity Assessment Upper Extremity Assessment: Defer to OT evaluation  Lower Extremity Assessment Lower Extremity Assessment: RLE deficits/detail RLE Deficits / Details: s/p fem-pop bypass RLE: Unable to fully assess due to pain       Communication   Communication: No difficulties  Cognition Arousal/Alertness: Awake/alert Behavior During Therapy: WFL for tasks assessed/performed Overall Cognitive Status: Within Functional  Limits for tasks assessed                                        General Comments General comments (skin integrity, edema, etc.): Pt on 4 L O2 prior to mobility with SpO2 95%. Pt ambulated on 6 L O2 with desat to 79%. Unsure of accuracy of reading as pt assymptomatic and able to carry on conversation. SpO2 88% in recliner on 4 L O2 at end of session.    Exercises     Assessment/Plan    PT Assessment Patient needs continued PT services  PT Problem List Decreased mobility;Decreased activity tolerance;Cardiopulmonary status limiting activity;Decreased balance;Pain       PT Treatment Interventions DME instruction;Therapeutic activities;Gait training;Therapeutic exercise;Patient/family education;Stair training;Balance training;Functional mobility training    PT Goals (Current goals can be found in the Care Plan section)  Acute Rehab PT Goals Patient Stated Goal: home PT Goal Formulation: With patient Time For Goal Achievement: 05/09/19 Potential to Achieve Goals: Good    Frequency Min 3X/week   Barriers to discharge        Co-evaluation               AM-PAC PT "6 Clicks" Mobility  Outcome Measure Help needed turning from your back to your side while in a flat bed without using bedrails?: None Help needed moving from lying on your back to sitting on the side of a flat bed without using bedrails?: None Help needed moving to and from a bed to a chair (including a wheelchair)?: A Little Help needed standing up from a chair using your arms (e.g., wheelchair or bedside chair)?: A Little Help needed to walk in hospital room?: A Little Help needed climbing 3-5 steps with a railing? : A Little 6 Click Score: 20    End of Session Equipment Utilized During Treatment: Gait belt;Oxygen Activity Tolerance: Patient tolerated treatment well Patient left: in chair;with call bell/phone within reach Nurse Communication: Mobility status PT Visit Diagnosis: Difficulty in  walking, not elsewhere classified (R26.2);Pain Pain - Right/Left: Right Pain - part of body: Leg    Time: 4239-5320 PT Time Calculation (min) (ACUTE ONLY): 23 min   Charges:   PT Evaluation $PT Eval Moderate Complexity: 1 Mod PT Treatments $Gait Training: 8-22 mins        Lorrin Goodell, PT  Office # [phone] Pager [phone]   Lorriane Shire 04/25/2019, 10:44 AM

## 2019-04-25 NOTE — Progress Notes (Addendum)
  Progress Note    04/25/2019 7:20 AM 1 Day Post-Op  Subjective:  R foot feels better compared to before surgery.   Vitals:   04/25/19 0015 04/25/19 0430  BP: (!) 144/56 (!) 159/71  Pulse: 75 76  Resp: 18 17  Temp: 97.6 F (36.4 C) 97.7 F (36.5 C)  SpO2: 90% 92%   Physical Exam: Lungs:  Non labored Incisions:  R groin and popliteal incisions c/d/i; LLE vein harvest incv Extremities:  Biphasic peroneal and PT, monophasic AT Neurologic: A&O  CBC    Component Value Date/Time   WBC 10.7 (H) 04/25/2019 0413   RBC 4.46 04/25/2019 0413   HGB 13.5 04/25/2019 0413   HCT 41.2 04/25/2019 0413   PLT 146 (L) 04/25/2019 0413   MCV 92.4 04/25/2019 0413   MCH 30.3 04/25/2019 0413   MCHC 32.8 04/25/2019 0413   RDW 15.3 04/25/2019 0413   LYMPHSABS 2.0 11/20/2018 1927   MONOABS 0.8 11/20/2018 1927   EOSABS 0.0 11/20/2018 1927   BASOSABS 0.0 11/20/2018 1927    BMET    Component Value Date/Time   NA 139 04/25/2019 0413   K 4.6 04/25/2019 0413   CL 107 04/25/2019 0413   CO2 24 04/25/2019 0413   GLUCOSE 138 (H) 04/25/2019 0413   BUN 20 04/25/2019 0413   CREATININE 1.49 (H) 04/25/2019 0413   CREATININE 0.99 11/12/2014 1344   CALCIUM 8.8 (L) 04/25/2019 0413   GFRNONAA 48 (L) 04/25/2019 0413   GFRAA 55 (L) 04/25/2019 0413    INR    Component Value Date/Time   INR 1.13 11/25/2018 0741     Intake/Output Summary (Last 24 hours) at 04/25/2019 0720 Last data filed at 04/25/2019 0533 Gross per 24 hour  Intake 2208.64 ml  Output 1785 ml  Net 423.64 ml     Assessment/Plan:  68 y.o. 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  Curt Jews, MD 04/25/2019 9:01 AM

## 2019-04-25 NOTE — Care Management Important Message (Signed)
Important Message  Patient Details  Name: Kyle [name] MRN: [id number] Date of Birth: 07/20/1951   Medicare Important Message Given:  Yes     Shelda Altes 04/25/2019, 12:41 PM

## 2019-04-26 MED ORDER — OXYCODONE-ACETAMINOPHEN 5-325 MG PO TABS: 1.0000 | ORAL_TABLET | ORAL | 0 refills | Status: DC | PRN

## 2019-04-26 MED ORDER — APIXABAN 5 MG PO TABS
5.0000 mg | ORAL_TABLET | Freq: Two times a day (BID) | ORAL | Status: DC
  Administered 2019-04-26: 5 mg via ORAL
  Filled 2019-04-26: qty 1

## 2019-04-26 MED ORDER — METFORMIN HCL 500 MG PO TABS
500.0000 mg | ORAL_TABLET | Freq: Two times a day (BID) | ORAL | Status: DC
  Administered 2019-04-26: 500 mg via ORAL
  Filled 2019-04-26: qty 1

## 2019-04-26 NOTE — Discharge Instructions (Signed)
 [facility] [place]  Discharge instructions  Lower Extremity Bypass Surgery  Please refer to the following instruction for your post-procedure care. Your surgeon or physician assistant will discuss any changes with you.  Activity  You are encouraged to walk as much as you can. You can slowly return to normal activities during the month after your surgery. Avoid strenuous activity and heavy lifting until your doctor tells you it's OK. Avoid activities such as vacuuming or swinging a golf club. Do not drive until your doctor give the OK and you are no longer taking prescription pain medications. It is also normal to have difficulty with sleep habits, eating and bowel movement after surgery. These will go away with time.  Bathing/Showering  You may shower after you go home. Do not soak in a bathtub, hot tub, or swim until the incision heals completely.  Incision Care  Clean your incision with mild soap and water. Shower every day. Pat the area dry with a clean towel. You do not need a bandage unless otherwise instructed. Do not apply any ointments or creams to your incision. If you have open wounds you will be instructed how to care for them or a visiting nurse may be arranged for you. If you have staples or sutures along your incision they will be removed at your post-op appointment. You may have skin glue on your incision. Do not peel it off. It will come off on its own in about one week. If you have a great deal of moisture in your groin, use a gauze help keep this area dry.  Diet  Resume your normal diet. There are no special food restrictions following this procedure. A low fat/ low cholesterol diet is recommended for all patients with vascular disease. In order to heal from your surgery, it is CRITICAL to get adequate nutrition. Your body requires vitamins, minerals, and protein. Vegetables are the best source of vitamins and minerals. Vegetables also provide the  perfect balance of protein. Processed food has little nutritional value, so try to avoid this.  Medications  Resume taking all your medications unless your doctor or nurse practitioner tells you not to. If your incision is causing pain, you may take over-the-counter pain relievers such as acetaminophen (Tylenol). If you were prescribed a stronger pain medication, please aware these medication can cause nausea and constipation. Prevent nausea by taking the medication with a snack or meal. Avoid constipation by drinking plenty of fluids and eating foods with high amount of fiber, such as fruits, vegetables, and grains. Take Colase 100 mg (an over-the-counter stool softener) twice a day as needed for constipation. Do not take Tylenol if you are taking prescription pain medications.  Follow Up  Our office will schedule a follow up appointment 2-3 weeks following discharge.  Please call us immediately for any of the following conditions  Severe or worsening pain in your legs or feet while at rest or while walking Increase pain, redness, warmth, or drainage (pus) from your incision site(s) Fever of 101 degree or higher The swelling in your leg with the bypass suddenly worsens and becomes more painful than when you were in the hospital If you have been instructed to feel your graft pulse then you should do so every day. If you can no longer feel this pulse, call the office immediately. Not all patients are given this instruction.  Leg swelling is common after leg bypass surgery.  The swelling should improve over a few months   following surgery. To improve the swelling, you may elevate your legs above the level of your heart while you are sitting or resting. Your surgeon or physician assistant may ask you to apply an ACE wrap or wear compression (TED) stockings to help to reduce swelling.  Reduce your risk of vascular disease  Stop smoking. If you would like help call [facility] at 1-800-QUIT-NOW  (1-800-784-8669) or [place] at 336-586-4000.  Manage your cholesterol Maintain a desired weight Control your diabetes weight Control your diabetes Keep your blood pressure down  If you have any questions, please call the office at [phone]   

## 2019-04-26 NOTE — Progress Notes (Signed)
Since shift change, pt SpO2 per multiple sites (4 fingers, 2 on each hand) has been 80-92% requiring up-titration of [place] O2 to 6L from 4L. Pt asymptomatic. Site changed @ 0040 to R [place] and SpO2 98% on 2L [place] (pt home O2 regimen). Will continue to monitor but it appears pt has poor perfusion in fingers despite palpable radial pulses and warm fingers tips bilaterally.

## 2019-04-26 NOTE — Progress Notes (Addendum)
Physical Therapy Treatment Patient Details Name: Kyle [name] MRN: [id number] DOB: 09-10-1951 Today's Date: 04/26/2019    History of Present Illness Pt is a 68 y.o. male s/p repeat R fem-pop bypass. PMH consists of CHF, HTN, COPD and previous R fem-pop bypass 2010.    PT Comments    Pt received in recliner. Min guard assist required for transfers and ambulation 225 with RW. Pt on 2 L O2 throughout session. Pt declining need for HHPT on d/c.    Follow Up Recommendations  No PT follow up;Supervision for mobility/OOB     Equipment Recommendations  Cane(when ready to progress off RW)    Recommendations for Other Services       Precautions / Restrictions Precautions Precautions: Other (comment) Precaution Comments: watch sats    Mobility  Bed Mobility Overal bed mobility: Modified Independent                Transfers Overall transfer level: Needs assistance Equipment used: Ambulation equipment used Transfers: Sit to/from Stand Sit to Stand: Min guard         General transfer comment: increased time to power up and stabilize balance, min guard for safety  Ambulation/Gait Ambulation/Gait assistance: Min guard Gait Distance (Feet): 225 Feet Assistive device: Rolling walker (2 wheeled) Gait Pattern/deviations: Antalgic;Decreased stride length;Decreased weight shift to right;Step-through pattern Gait velocity: decreased Gait velocity interpretation: 1.31 - 2.62 ft/sec, indicative of limited community ambulator General Gait Details: improved fluidity of gait pattern with distance, as tightness RLE improved. Attempted use of cane. Pt feels more stable with RW. He reports having RW at home from previous surgery.   Stairs Stairs: (Pt declined stair training. Reports no concerns getting in his apt. Verbally educated on ascend/descend forward and sideways with rail. Pt verbalizes understanding.)           Wheelchair Mobility    Modified Rankin (Stroke Patients  Only)       Balance Overall balance assessment: Needs assistance Sitting-balance support: No upper extremity supported;Feet supported Sitting balance-Leahy Scale: Good     Standing balance support: No upper extremity supported;During functional activity Standing balance-Leahy Scale: Fair                              Cognition Arousal/Alertness: Awake/alert Behavior During Therapy: WFL for tasks assessed/performed Overall Cognitive Status: Within Functional Limits for tasks assessed                                        Exercises      General Comments General comments (skin integrity, edema, etc.): SpO2 >90% on 2 L O2 throughout session.      Pertinent Vitals/Pain Pain Assessment: 0-10 Pain Score: 6  Pain Location: RLE Pain Descriptors / Indicators: Tightness Pain Intervention(s): Monitored during session;Repositioned    Home Living               Home Equipment: Walker - 2 wheels Additional Comments: On 2 L home O2    Prior Function            PT Goals (current goals can now be found in the care plan section) Acute Rehab PT Goals Patient Stated Goal: home today PT Goal Formulation: With patient Time For Goal Achievement: 05/09/19 Potential to Achieve Goals: Good Progress towards PT goals: Progressing toward goals    Frequency  Min 3X/week      PT Plan Current plan remains appropriate    Co-evaluation              AM-PAC PT "6 Clicks" Mobility   Outcome Measure  Help needed turning from your back to your side while in a flat bed without using bedrails?: None Help needed moving from lying on your back to sitting on the side of a flat bed without using bedrails?: None Help needed moving to and from a bed to a chair (including a wheelchair)?: A Little Help needed standing up from a chair using your arms (e.g., wheelchair or bedside chair)?: A Little Help needed to walk in hospital room?: A Little Help needed  climbing 3-5 steps with a railing? : A Little 6 Click Score: 20    End of Session Equipment Utilized During Treatment: Gait belt;Oxygen Activity Tolerance: Patient tolerated treatment well Patient left: in chair;with call bell/phone within reach Nurse Communication: Mobility status PT Visit Diagnosis: Difficulty in walking, not elsewhere classified (R26.2);Pain Pain - Right/Left: Right Pain - part of body: Leg     Time: 8546-2703 PT Time Calculation (min) (ACUTE ONLY): 34 min  Charges:  $Gait Training: 23-37 mins                     Lorrin Goodell, [place]  Office # [phone] Pager [phone]    Lorriane Shire 04/26/2019, 10:09 AM

## 2019-04-26 NOTE — Plan of Care (Signed)
?  Problem: Clinical Measurements: ?Goal: Will remain free from infection ?Outcome: Progressing ?Goal: Diagnostic test results will improve ?Outcome: Progressing ?Goal: Respiratory complications will improve ?Outcome: Progressing ?  ?

## 2019-04-26 NOTE — Discharge Summary (Signed)
Physician Discharge Summary   Patient ID: Kyle [name] [id number] 68 y.o. [birth year]  Admit date: 04/22/2019  Discharge date and time: 04/26/2019  Admitting Physician: Angelia Mould, MD   Discharge Physician: Dr. early  Admission Diagnoses: Ischemic rest pain right lower extremity  Discharge Diagnoses: PAD  Admission Condition: poor  Discharged Condition: fair  Indication for Admission: Ischemic rest pain right lower extremity with tissue loss  Hospital Course: Kyle [name] is a 68 year old male who underwent diagnostic arteriogram by [name] on 04/21/2019 as an outpatient procedure.  He presented to the emergency department the following day with worsening rest pain of right lower extremity.  He was admitted to the hospital for pain control, vein mapping, and cardiology consult for preoperative optimization.  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  He tolerated the procedure well and was transferred to the stepdown unit postoperatively.  POD #1 he had a multiphasic peroneal signal as well as a monophasic PT signal by Doppler.  He was evaluated by physical therapy who did not recommend any follow-up.  POD #2 pain is controlled and he is feeling ready for discharge home.  He is experiencing some drainage from vein harvest incisions of left lower extremity however there are no palpable firm fluid collections.  At the time of discharge he maintains a brisk multiphasic peroneal signal as well as a collateralized PT signal by Doppler.  He will follow-up with Dr. early in 2 weeks.  He will be prescribed 2 to 3 days of narcotic pain medication for continued postoperative pain control.  Discharge instructions were reviewed with the patient and he voices his understanding.  He will be discharged  home this morning in stable condition.  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 early on 04/24/2019  Discharge Exam: See progress note 04/26/2019 Vitals:   04/26/19 0310 04/26/19 0855  BP: (!) 160/77 (!) 165/75  Pulse: 90 78  Resp: 19   Temp: 98.2 F (36.8 C)   SpO2: 93%      Disposition: Discharge disposition: 01-Home or Self Care       - For [facility] Registry use ---  Post-op:  Wound infection: No  Graft infection: No  Transfusion: No  New Arrhythmia: No Patency judged by: [x ] Dopper only, [ ]  Palpable graft pulse, [ ]  Palpable distal pulse, [ ]  ABI inc. > 0.15, [ ]  Duplex D/C Ambulatory Status: Ambulatory  Complications: MI: [x ] No, [ ]  Troponin only, [ ]  EKG or Clinical CHF: No Resp failure: [x ] none, [ ]  Pneumonia, [ ]  Ventilator Chg in renal function: [x ] none, [ ]  Inc. Cr > 0.5, [ ]  Temp. Dialysis, [ ]  Permanent dialysis Stroke: [ x] None, [ ]  Minor, [ ]  Major Return to OR: No  Reason for return to OR: [ ]  Bleeding, [ ]  Infection, [ ]  Thrombosis, [ ]  Revision  Discharge medications: Statin use:  Yes ASA use:  Yes Plavix use:  No  for medical reason not indicated Beta blocker use: No  for medical reason not indicated Coumadin use: Yes    Patient Instructions:  Allergies as of 04/26/2019   No Known Allergies     Medication List    TAKE  these medications   Accu-Chek FastClix Lancets Misc   Accu-Chek Guide test strip Generic drug: glucose blood   amLODipine 5 MG tablet Commonly known as: NORVASC Take 1 tablet (5 mg total) by mouth daily.   Anoro Ellipta 62.5-25 MCG/INH Aepb Generic drug: umeclidinium-vilanterol INHALE 1 PUFF INTO THE LUNGS DAILY What changed: See the new instructions.   apixaban 5 MG Tabs tablet Commonly known as: ELIQUIS Take 1 tablet (5 mg total) by mouth 2 (two) times daily.   BD Pen Needle Nano  U/F 32G X 4 MM Misc Generic drug: Insulin Pen Needle   carvedilol 12.5 MG tablet Commonly known as: COREG TAKE 1 TABLET TWICE DAILY WITH MEALS What changed: when to take this   cefdinir 300 MG capsule Commonly known as: OMNICEF Take 300 mg by mouth 2 (two) times daily.   furosemide 40 MG tablet Commonly known as: LASIX TAKE 1 TABLET (40 MG TOTAL) BY MOUTH DAILY.   guaiFENesin 600 MG 12 hr tablet Commonly known as: MUCINEX Take 2 tablets (1,200 mg total) by mouth 2 (two) times daily. What changed: how much to take   insulin glargine 100 UNIT/ML injection Commonly known as: LANTUS Inject 15 Units into the skin at bedtime.   metFORMIN 500 MG tablet Commonly known as: GLUCOPHAGE Take 500 mg by mouth 2 (two) times a day.   oxyCODONE-acetaminophen 5-325 MG tablet Commonly known as: PERCOCET/ROXICET Take 1 tablet by mouth every 4 (four) hours as needed for severe pain (pain/sleep). What changed: when to take this   pravastatin 40 MG tablet Commonly known as: PRAVACHOL TAKE 1 TABLET AT BEDTIME What changed: additional instructions   quinapril 10 MG tablet Commonly known as: ACCUPRIL TAKE 1 TABLET (10 MG TOTAL) DAILY. What changed: See the new instructions.      Activity: activity as tolerated Diet: regular diet Wound Care: keep wound clean and dry  Follow-up with Dr. Donnetta Hutching in 2 weeks.  SignedDagoberto Ligas 04/26/2019 9:57 AM

## 2019-05-01 ENCOUNTER — Ambulatory Visit: Payer: Medicare HMO | Admitting: Acute Care

## 2019-05-02 ENCOUNTER — Encounter: Payer: Self-pay | Admitting: Vascular Surgery

## 2019-05-03 ENCOUNTER — Encounter (HOSPITAL_COMMUNITY): Payer: Medicare HMO

## 2019-05-03 ENCOUNTER — Ambulatory Visit: Payer: Medicare HMO | Admitting: Vascular Surgery

## 2019-05-05 ENCOUNTER — Other Ambulatory Visit: Payer: Self-pay | Admitting: Pulmonary Disease

## 2019-05-11 DIAGNOSIS — T8131XA Disruption of external operation (surgical) wound, not elsewhere classified, initial encounter: Secondary | ICD-10-CM | POA: Insufficient documentation

## 2019-05-15 ENCOUNTER — Ambulatory Visit (INDEPENDENT_AMBULATORY_CARE_PROVIDER_SITE_OTHER): Payer: Medicare HMO | Admitting: Nurse Practitioner

## 2019-05-15 ENCOUNTER — Encounter: Payer: Self-pay | Admitting: Nurse Practitioner

## 2019-05-15 ENCOUNTER — Encounter: Payer: Self-pay | Admitting: *Deleted

## 2019-05-15 ENCOUNTER — Other Ambulatory Visit: Payer: Self-pay

## 2019-05-15 DIAGNOSIS — J9611 Chronic respiratory failure with hypoxia: Secondary | ICD-10-CM

## 2019-05-15 DIAGNOSIS — J449 Chronic obstructive pulmonary disease, unspecified: Secondary | ICD-10-CM

## 2019-05-15 NOTE — Progress Notes (Signed)
@Patient  ID: Kyle [name], male    DOB: [birth year], 68 y.o.   MRN: [id number]  Chief Complaint  Patient presents with  . Follow-up    for sob, states feels that his breathing is doing well,denies problems    Referring provider: Alonna Buckler, MD  HPI 68 year old male former smoker with COPD and chronic respiratory failure who is followed by Dr. Elsworth Soho.  Tests: Spirometry 12/16/2018 - severe airway obstruction with ratio of 64, FEV1 of 36% FVC of 43%  OV 05/16/19 -follow-up Patient presents today for follow-up visit.  He states that he has been doing well.  He is active.  Patient is compliant with Anoro.  Patient is concerned today because at his last visit with Dr. Elsworth Soho he was told that he needed 4 L of oxygen with exertion.  Patient has still been trying to work.  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  He takes breaks and goes out to his car during the day to put his oxygen on for a few minutes and then has to go back to work.  He would like a note to be out of work. Denies f/c/s, n/v/d, hemoptysis, PND, leg swelling.    Patient walked in office today sats dropped to 86% on RA and returned to 90% when placed on 2 L of O2.     No Known Allergies  Immunization History  Administered Date(s) Administered  . Influenza-Unspecified 01/26/2017, 08/10/2017  . Pneumococcal Conjugate-13 01/20/2018    Past Medical History:  Diagnosis Date  . Aortic insufficiency    MODERATE WITH A BICUSPID AORTIC VAVLE  . Arterial occlusive disease    MULTILEVEL  . CHF (congestive heart failure) ([place])   . COPD (chronic obstructive pulmonary disease) ([place])   . Dilated cardiomyopathy (H[facility])    WITH EJECTION FRACTION DOWN TO 20-25%--WITH CONGESTIVE HEART FAILURE  . Edema    LOWER EXTREMETIES  . Hypertension   . Normal coronary arteries 2009  . Orthopnea   . Peripheral arterial disease ([place])   . Pulmonary hypertension ([place])    . SOB (shortness of breath)     Tobacco History: Social History   Tobacco Use  Smoking Status Former Smoker  . Packs/day: 1.00  . Years: 40.00  . Pack years: 40.00  . Quit date: 10/15/2018  . Years since quitting: 0.5  Smokeless Tobacco Never Used  Tobacco Comment   quit 2010 and quit again 2019   Counseling given: Yes Comment: quit 2010 and quit again 2019   Outpatient Encounter Medications as of 05/15/2019  Medication Sig  . ACCU-CHEK FASTCLIX LANCETS MISC   . ACCU-CHEK GUIDE test strip   . amLODipine (NORVASC) 5 MG tablet Take 1 tablet (5 mg total) by mouth daily.  Marland Kitchen apixaban (ELIQUIS) 5 MG TABS tablet Take 1 tablet (5 mg total) by mouth 2 (two) times daily.  . BD PEN NEEDLE NANO U/F 32G X 4 MM MISC   . carvedilol (COREG) 12.5 MG tablet TAKE 1 TABLET TWICE DAILY WITH MEALS (Patient taking differently: Take 12.5 mg by mouth 2 (two) times a day. )  . furosemide (LASIX) 40 MG tablet TAKE 1 TABLET (40 MG TOTAL) BY MOUTH DAILY.  Marland Kitchen guaiFENesin (MUCINEX) 600 MG 12 hr tablet Take 2 tablets (1,200 mg total) by mouth 2 (two) times daily. (Patient taking differently: Take 600 mg by mouth 2 (two) times daily. )  .  insulin glargine (LANTUS) 100 UNIT/ML injection Inject 15 Units into the skin at bedtime.  . metFORMIN (GLUCOPHAGE) 500 MG tablet Take 500 mg by mouth 2 (two) times a day.   . oxyCODONE-acetaminophen (PERCOCET/ROXICET) 5-325 MG tablet Take 1 tablet by mouth every 4 (four) hours as needed for severe pain (pain/sleep).  . pravastatin (PRAVACHOL) 40 MG tablet TAKE 1 TABLET AT BEDTIME (Patient taking differently: Take 40 mg by mouth at bedtime. TAKE 1 TABLET AT BEDTIME)  . quinapril (ACCUPRIL) 10 MG tablet TAKE 1 TABLET (10 MG TOTAL) DAILY. (Patient taking differently: Take 10 mg by mouth daily. )  . umeclidinium-vilanterol (ANORO ELLIPTA) 62.5-25 MCG/INH AEPB Inhale 1 puff into the lungs daily.  . [DISCONTINUED] cefdinir (OMNICEF) 300 MG capsule Take 300 mg by mouth 2 (two) times  daily.   No facility-administered encounter medications on file as of 05/15/2019.      Review of Systems  Review of Systems  Constitutional: Negative.  Negative for chills and fever.  HENT: Negative.   Respiratory: Positive for shortness of breath (with exertion). Negative for cough and wheezing.   Cardiovascular: Negative.  Negative for chest pain, palpitations and leg swelling.  Gastrointestinal: Negative.   Allergic/Immunologic: Negative.   Neurological: Negative.   Psychiatric/Behavioral: Negative.        Physical Exam  BP 126/70 (BP Location: Right Arm, Cuff Size: Normal)   Pulse 76   Temp 97.9 F (36.6 C) (Oral)   Ht 5\' 10"  (1.778 m)   Wt 151 lb (68.5 kg)   SpO2 90%   BMI 21.67 kg/m   Wt Readings from Last 5 Encounters:  05/15/19 151 lb (68.5 kg)  04/22/19 148 lb 9.6 oz (67.4 kg)  04/21/19 151 lb (68.5 kg)  04/05/19 151 lb 11.2 oz (68.8 kg)  12/16/18 152 lb 12.8 oz (69.3 kg)     Physical Exam Vitals signs and nursing note reviewed.  Constitutional:      General: He is not in acute distress.    Appearance: He is well-developed.  Cardiovascular:     Rate and Rhythm: Normal rate and regular rhythm.  Pulmonary:     Effort: Pulmonary effort is normal. No respiratory distress.     Breath sounds: Normal breath sounds. No wheezing or rhonchi.  Musculoskeletal:        General: No swelling.  Skin:    General: Skin is warm and dry.  Neurological:     Mental Status: He is alert and oriented to person, place, and time.     Imaging: Vas [place] Lower Extremity Saphenous Vein Mapping  Result Date: 04/23/2019 LOWER EXTREMITY VEIN MAPPING Indications:        Pre-op Other Indications:  Angiogram 04/21/19 revealing occluded femoropopliteal with                     peroneal only runoff Risk Factors:       PAD. Other Risk Factors: Aortobifemoral bypass in 2016. right femoral to popliteal                     bypass with vein in 2010 which is chronically occluded.  Comparison  Study: Prior LLE vein mapping done 12/17/14 Performing Technologist: Sharion Dove RVS  Examination Guidelines: A complete evaluation includes B-mode imaging, spectral Doppler, color Doppler, and power Doppler as needed of all accessible portions of each vessel. Bilateral testing is considered an integral part of a complete examination. Limited examinations for reoccurring indications may be performed as noted. +---------------+-----------+----------------------+---------------+-----------+  RT Diameter  RT Findings         GSV            LT Diameter  LT Findings      (cm)                                            (cm)                  +---------------+-----------+----------------------+---------------+-----------+      0.36                     Saphenofemoral         0.56                                                   Junction                                  +---------------+-----------+----------------------+---------------+-----------+      0.26                     Proximal thigh         0.48                  +---------------+-----------+----------------------+---------------+-----------+      0.21       branching       Mid thigh            0.34                  +---------------+-----------+----------------------+---------------+-----------+      0.20                      Distal thigh          0.15       branching  +---------------+-----------+----------------------+---------------+-----------+      0.26                          Knee              0.18                  +---------------+-----------+----------------------+---------------+-----------+      0.21                       Prox calf            0.25                  +---------------+-----------+----------------------+---------------+-----------+      0.19                        Mid calf            0.16       branching   +---------------+-----------+----------------------+---------------+-----------+      0.17                      Distal calf           0.18                  +---------------+-----------+----------------------+---------------+-----------+  0.18       branching         Ankle              0.15                  +---------------+-----------+----------------------+---------------+-----------+ Diagnosing physician: Curt Jews MD Electronically signed by Curt Jews MD on 04/23/2019 at 9:05:45 AM.    Final      Assessment & Plan:   COPD with chronic bronchitis and emphysema ([place]) This is been a stable interval for patient.  He is compliant with Anoro.  Patient Instructions  Will provide patient a note to be out of work do to oxygen dependency for the next 2 months until follow up visit Continue O2 at 2-4 L [place] with exertion to keep sats above 88% Continue Anoro Stay as active as possible  Follow up: Follow up with Dr. Elsworth Soho in 6-8 weeks or sooner if needed     Chronic respiratory failure with hypoxia [facility]) Patient is concerned today because at his last visit with Dr. Elsworth Soho he was told that he needed 4 L of oxygen with exertion.  Patient has still been trying to work.  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  He takes breaks and goes out to his car during the day to put his oxygen on for a few minutes and then has to go back to work.  He would like a note to be out of work.  Patient walked in office today sats dropped to 86% on RA and returned to 90% when placed on 2 L of O2.   Patient Instructions  Will provide patient a note to be out of work do to oxygen dependency for the next 2 months until follow up visit Continue O2 at 2-4 L [place] with exertion to keep sats above 88% Continue Anoro Stay as active as possible  Follow up: Follow up with Dr. Elsworth Soho in 6-8 weeks or sooner if needed        Fenton Foy, NP  05/16/2019

## 2019-05-15 NOTE — Patient Instructions (Addendum)
Will provide patient a note to be out of work do to oxygen dependency for the next 2 months until follow up visit Continue O2 at 2-4 L [place] with exertion to keep sats above 88% Continue Anoro Stay as active as possible  Follow up: Follow up with Dr. Elsworth Soho in 6-8 weeks or sooner if needed

## 2019-05-16 ENCOUNTER — Encounter: Payer: Self-pay | Admitting: Nurse Practitioner

## 2019-05-16 NOTE — Assessment & Plan Note (Signed)
This is been a stable interval for patient.  He is compliant with Anoro.  Patient Instructions  Will provide patient a note to be out of work do to oxygen dependency for the next 2 months until follow up visit Continue O2 at 2-4 L [place] with exertion to keep sats above 88% Continue Anoro Stay as active as possible  Follow up: Follow up with Dr. Elsworth Soho in 6-8 weeks or sooner if needed

## 2019-05-16 NOTE — Assessment & Plan Note (Signed)
Patient is concerned today because at his last visit with Dr. Elsworth Soho he was told that he needed 4 L of oxygen with exertion.  Patient has still been trying to work.  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  He takes breaks and goes out to his car during the day to put his oxygen on for a few minutes and then has to go back to work.  He would like a note to be out of work.  Patient walked in office today sats dropped to 86% on RA and returned to 90% when placed on 2 L of O2.   Patient Instructions  Will provide patient a note to be out of work do to oxygen dependency for the next 2 months until follow up visit Continue O2 at 2-4 L [place] with exertion to keep sats above 88% Continue Anoro Stay as active as possible  Follow up: Follow up with Dr. Elsworth Soho in 6-8 weeks or sooner if needed

## 2019-05-19 ENCOUNTER — Telehealth: Payer: Self-pay | Admitting: Nurse Practitioner

## 2019-05-19 NOTE — Telephone Encounter (Signed)
Call returned to patient, he is requesting to have his leave of work extended. He states nothing has changed as far as his condition but he does want his leave of work extended through October.   TN please advise. Thanks.

## 2019-05-19 NOTE — Telephone Encounter (Signed)
Called and spoke with pt's wife Kyle [name] letting her know that the letter we wrote 7/20 was for him to be excused for at least two months. Stated to her that pt was to f/u 6-8 weeks but I saw that no appt had been scheduled.  Asked Kyle [name] if they would be fine seeing TN again if RA had no schedule avail and she said that would be fine. appt scheduled for pt with TN Tues. 9/8 at 12pm. Nothing further needed.

## 2019-05-19 NOTE — Telephone Encounter (Signed)
We were making the note for 2 months and we were going to follow up at that time. His note should have been through the end of September or beginning of October. Thanks.

## 2019-07-04 ENCOUNTER — Ambulatory Visit: Payer: Medicare HMO | Admitting: Primary Care

## 2019-07-04 ENCOUNTER — Ambulatory Visit: Payer: Medicare HMO | Admitting: Nurse Practitioner

## 2019-07-05 ENCOUNTER — Ambulatory Visit: Payer: Medicare HMO | Admitting: Pulmonary Disease

## 2019-07-13 NOTE — Progress Notes (Signed)
@Patient  ID: Kyle [name], male    DOB: 1951-09-24, 68 y.o.   MRN: [id number]  Chief Complaint  Patient presents with  . Follow-up    16-month COPD follow-up    Referring provider: Alonna Buckler, MD  HPI:  68 year old male former smoker followed in our office for COPD and exertional hypoxemia  PMH: Hypertension, dilated cardiomyopathy, CHF, pulmonary hypertension, dyslipidemia Smoker/ Smoking History: Former smoker Maintenance:  Anoro Ellipta  Pt of: Dr. Elsworth Soho  07/14/2019  - Visit   68 year old male former smoker presenting to our office today as a 37-month COPD follow-up.  Patient presents today with his spouse.  He is reporting no acute changes with his breathing.  mMRC 0 today.  Patient is maintained on Anoro Ellipta.  He reports that the cost of Anoro Ellipta has increased outs about $130 a month.  Suspect patient may be in donut hole as patient is also maintained on insulin and Eliquis.  Patient presented to our office today with no oxygen.  At last office visit in July/2020 patient was walked and required 2 L of O2 with physical exertion.   Questionaires / Pulmonary Flowsheets:   MMRC: mMRC Dyspnea Scale mMRC Score  07/14/2019 0    Tests:   11/26/2018-CTA chest-negative for PE, trace pleural effusions, atelectasis in bases right greater than left  11/20/2018-CT head without contrast- sinuses normal 12/16/2018-spirometry- FVC 1.7 (43% predicted), ratio 64, FEV1 1.1 (36% predicted), consistent with severe airway obstruction  11/21/2018-echocardiogram-LV ejection fraction 40 to 45%, diffuse hypokinesis, right ventricle moderately dilated, systolic function was moderately reduced, PA P pressure 53  FENO:  No results found for: NITRICOXIDE  PFT: No flowsheet data found.  Walk:  SIX MIN WALK 05/15/2019 12/16/2018  Supplimental Oxygen during Test? (L/min) Yes Yes  O2 Flow Rate 2 4  Type Continuous Continuous  Tech Comments: - slower pace due to unsteady gait.   Patient began off O2 but desaturated at about 1/2 a lap to 85%.  O2 titrated from 2L to 4L to maintain SpO2 above 90%.  Completed walk on 4L O2.  Mild to Mod SOB noted most of the walk.  [name] RN   07/14/2019 2nd lap walk required 2L of o2, Spo02 dropped to 83, returned to 98 percent with 2L of o2  Imaging: No results found.  Lab Results:  CBC    Component Value Date/Time   WBC 10.7 (H) 04/25/2019 0413   RBC 4.46 04/25/2019 0413   HGB 13.5 04/25/2019 0413   HCT 41.2 04/25/2019 0413   PLT 146 (L) 04/25/2019 0413   MCV 92.4 04/25/2019 0413   MCH 30.3 04/25/2019 0413   MCHC 32.8 04/25/2019 0413   RDW 15.3 04/25/2019 0413   LYMPHSABS 2.0 11/20/2018 1927   MONOABS 0.8 11/20/2018 1927   EOSABS 0.0 11/20/2018 1927   BASOSABS 0.0 11/20/2018 1927    BMET    Component Value Date/Time   NA 139 04/25/2019 0413   K 4.6 04/25/2019 0413   CL 107 04/25/2019 0413   CO2 24 04/25/2019 0413   GLUCOSE 138 (H) 04/25/2019 0413   BUN 20 04/25/2019 0413   CREATININE 1.49 (H) 04/25/2019 0413   CREATININE 0.99 11/12/2014 1344   CALCIUM 8.8 (L) 04/25/2019 0413   GFRNONAA 48 (L) 04/25/2019 0413   GFRAA 55 (L) 04/25/2019 0413    BNP    Component Value Date/Time   BNP 709.2 (H) 11/20/2018 2341   BNP 997.9 (H) 11/09/2014 1333    ProBNP  Component Value Date/Time   PROBNP 483.0 (H) 10/19/2008 0328    Specialty Problems      Pulmonary Problems   Chronic respiratory failure with hypoxia (HCC)   COPD with chronic bronchitis and emphysema (HCC)    Severe -FEV1 36%         No Known Allergies  Immunization History  Administered Date(s) Administered  . Influenza-Unspecified 01/26/2017, 08/10/2017  . Pneumococcal Conjugate-13 01/20/2018   Pneumovax23 today  High dose flu vaccine today    Past Medical History:  Diagnosis Date  . Aortic insufficiency    MODERATE WITH A BICUSPID AORTIC VAVLE  . Arterial occlusive disease    MULTILEVEL  . CHF (congestive heart  failure) ([place])   . COPD (chronic obstructive pulmonary disease) ([place])   . Dilated cardiomyopathy (HCC)    WITH EJECTION FRACTION DOWN TO 20-25%--WITH CONGESTIVE HEART FAILURE  . Edema    LOWER EXTREMETIES  . Hypertension   . Normal coronary arteries 2009  . Orthopnea   . Peripheral arterial disease ([place])   . Pulmonary hypertension ([place])   . SOB (shortness of breath)     Tobacco History: Social History   Tobacco Use  Smoking Status Former Smoker  . Packs/day: 1.00  . Years: 40.00  . Pack years: 40.00  . Quit date: 10/15/2018  . Years since quitting: 0.7  Smokeless Tobacco Never Used  Tobacco Comment   quit 2010 and quit again 2019   Counseling given: Yes Comment: quit 2010 and quit again 2019   Continue to not smoke  Outpatient Encounter Medications as of 07/14/2019  Medication Sig  . ACCU-CHEK FASTCLIX LANCETS MISC   . ACCU-CHEK GUIDE test strip   . amLODipine (NORVASC) 5 MG tablet Take 1 tablet (5 mg total) by mouth daily.  Marland Kitchen apixaban (ELIQUIS) 5 MG TABS tablet Take 1 tablet (5 mg total) by mouth 2 (two) times daily.  . BD PEN NEEDLE NANO U/F 32G X 4 MM MISC   . carvedilol (COREG) 12.5 MG tablet TAKE 1 TABLET TWICE DAILY WITH MEALS (Patient taking differently: Take 12.5 mg by mouth 2 (two) times a day. )  . furosemide (LASIX) 40 MG tablet TAKE 1 TABLET (40 MG TOTAL) BY MOUTH DAILY.  Marland Kitchen guaiFENesin (MUCINEX) 600 MG 12 hr tablet Take 2 tablets (1,200 mg total) by mouth 2 (two) times daily. (Patient taking differently: Take 600 mg by mouth 2 (two) times daily. )  . insulin glargine (LANTUS) 100 UNIT/ML injection Inject 15 Units into the skin at bedtime.  . metFORMIN (GLUCOPHAGE) 500 MG tablet Take 500 mg by mouth 2 (two) times a day.   . pravastatin (PRAVACHOL) 40 MG tablet TAKE 1 TABLET AT BEDTIME (Patient taking differently: Take 40 mg by mouth at bedtime. TAKE 1 TABLET AT BEDTIME)  . quinapril (ACCUPRIL) 10 MG tablet TAKE 1 TABLET (10 MG TOTAL) DAILY. (Patient taking  differently: Take 10 mg by mouth daily. )  . umeclidinium-vilanterol (ANORO ELLIPTA) 62.5-25 MCG/INH AEPB Inhale 1 puff into the lungs daily.  . [DISCONTINUED] oxyCODONE-acetaminophen (PERCOCET/ROXICET) 5-325 MG tablet Take 1 tablet by mouth every 4 (four) hours as needed for severe pain (pain/sleep).   No facility-administered encounter medications on file as of 07/14/2019.      Review of Systems  Review of Systems  Constitutional: Positive for fatigue (Baseline). Negative for activity change, chills, fever and unexpected weight change.  HENT: Negative for postnasal drip, rhinorrhea, sinus pressure, sinus pain and sore throat.   Eyes: Negative.   Respiratory:  Positive for shortness of breath (Baseline). Negative for cough and wheezing.   Cardiovascular: Negative for chest pain and palpitations.  Gastrointestinal: Negative for constipation, diarrhea, nausea and vomiting.  Endocrine: Negative.   Genitourinary: Negative.   Musculoskeletal: Negative.   Skin: Negative.   Neurological: Negative for dizziness and headaches.  Psychiatric/Behavioral: Negative.  Negative for dysphoric mood. The patient is not nervous/anxious.   All other systems reviewed and are negative.    Physical Exam  BP 126/74   Pulse 70   Temp 97.6 F (36.4 C) (Temporal)   Ht 5\' 10"  (1.778 m)   Wt 151 lb (68.5 kg)   SpO2 93%   BMI 21.67 kg/m   Wt Readings from Last 5 Encounters:  07/14/19 151 lb (68.5 kg)  05/15/19 151 lb (68.5 kg)  04/22/19 148 lb 9.6 oz (67.4 kg)  04/21/19 151 lb (68.5 kg)  04/05/19 151 lb 11.2 oz (68.8 kg)    BMI Readings from Last 5 Encounters:  07/14/19 21.67 kg/m  05/15/19 21.67 kg/m  04/22/19 20.73 kg/m  04/21/19 21.67 kg/m  04/05/19 21.16 kg/m    Physical Exam Vitals signs and nursing note reviewed.  Constitutional:      General: He is not in acute distress.    Appearance: Normal appearance. He is normal weight.     Comments: Thin elderly male  HENT:     Head:  Normocephalic and atraumatic.     Right Ear: Hearing, tympanic membrane, ear canal and external ear normal.     Left Ear: Hearing, tympanic membrane, ear canal and external ear normal.     Nose: Nose normal. No mucosal edema or rhinorrhea.     Right Turbinates: Not enlarged.     Left Turbinates: Not enlarged.     Mouth/Throat:     Mouth: Mucous membranes are dry.     Pharynx: Oropharynx is clear. No oropharyngeal exudate.  Eyes:     Pupils: Pupils are equal, round, and reactive to light.  Neck:     Musculoskeletal: Normal range of motion.  Cardiovascular:     Rate and Rhythm: Normal rate and regular rhythm.     Pulses: Normal pulses.     Heart sounds: Normal heart sounds. No murmur.  Pulmonary:     Effort: Pulmonary effort is normal.     Breath sounds: Normal breath sounds. No decreased breath sounds, wheezing or rales.  Musculoskeletal:     Right lower leg: No edema.     Left lower leg: No edema.  Lymphadenopathy:     Cervical: No cervical adenopathy.  Skin:    General: Skin is warm and dry.     Capillary Refill: Capillary refill takes less than 2 seconds.     Findings: No erythema or rash.  Neurological:     General: No focal deficit present.     Mental Status: He is alert and oriented to person, place, and time.     Motor: No weakness.     Coordination: Coordination normal.     Gait: Gait is intact. Gait normal.  Psychiatric:        Mood and Affect: Mood normal.        Behavior: Behavior normal. Behavior is cooperative.        Thought Content: Thought content normal.        Judgment: Judgment normal.      Assessment & Plan:   COPD with chronic bronchitis and emphysema ([place]) Plan: Continue Anoro Ellipta [employer] for you paperwork provided for  patient today Samples of Anoro Ellipta provided today Coupon for [employer] provided today High-dose flu vaccine today Pneumovax 23 today  Chronic respiratory failure with hypoxia (HCC) Plan: Walk today in office  Maintain oxygen as prescribed Maintain oxygen saturations greater than 90%  Protein-calorie malnutrition, severe (HCC) Plan: Continue to eat a healthy well-balanced diet Drink 1-2 high-protein boost or Ensure in between standard meals Remain adequately hydrated  Healthcare maintenance Plan: High-dose flu vaccine today Pneumovax 23 today   Medication management Patient likely in donut hole High cost of medications for insulin, Eliquis and Anoro Ellipta  Plan: Continue Anoro Ellipta [employer] for you paperwork provided today Sample of Anoro Ellipta provided today Coupon for [employer] provided today  We will need to reapply for [place] for you in 2021 if qualified    Return in about 3 months (around 10/13/2019), or if symptoms worsen or fail to improve, for Follow up with Dr. Elsworth Soho, Follow up with [name] FNP-C.   Lauraine Rinne, NP 07/14/2019   This appointment was 32 minutes long with over 50% of the time in direct face-to-face patient care, assessment, plan of care, and follow-up.

## 2019-07-14 ENCOUNTER — Other Ambulatory Visit: Payer: Self-pay

## 2019-07-14 ENCOUNTER — Ambulatory Visit (INDEPENDENT_AMBULATORY_CARE_PROVIDER_SITE_OTHER): Payer: Medicare HMO | Admitting: Pulmonary Disease

## 2019-07-14 ENCOUNTER — Encounter: Payer: Self-pay | Admitting: Pulmonary Disease

## 2019-07-14 VITALS — BP 126/74 | HR 70 | Temp 97.6°F | Ht 70.0 in | Wt 151.0 lb

## 2019-07-14 DIAGNOSIS — J9611 Chronic respiratory failure with hypoxia: Secondary | ICD-10-CM

## 2019-07-14 DIAGNOSIS — Z23 Encounter for immunization: Secondary | ICD-10-CM | POA: Diagnosis not present

## 2019-07-14 DIAGNOSIS — E43 Unspecified severe protein-calorie malnutrition: Secondary | ICD-10-CM | POA: Diagnosis not present

## 2019-07-14 DIAGNOSIS — Z79899 Other long term (current) drug therapy: Secondary | ICD-10-CM | POA: Insufficient documentation

## 2019-07-14 DIAGNOSIS — Z0000 Encounter for general adult medical examination without abnormal findings: Secondary | ICD-10-CM | POA: Insufficient documentation

## 2019-07-14 DIAGNOSIS — J449 Chronic obstructive pulmonary disease, unspecified: Secondary | ICD-10-CM

## 2019-07-14 DIAGNOSIS — Z7189 Other specified counseling: Secondary | ICD-10-CM | POA: Insufficient documentation

## 2019-07-14 MED ORDER — ANORO ELLIPTA 62.5-25 MCG/INH IN AEPB: 1.0000 | INHALATION_SPRAY | Freq: Every day | RESPIRATORY_TRACT | 0 refills | Status: DC

## 2019-07-14 NOTE — Patient Instructions (Addendum)
You were seen today by Lauraine Rinne, NP  for:   1. Protein-calorie malnutrition, severe (HCC)  Continue to exercise daily  Take supplemental high-protein meals such as boost or Ensure in between standard meals  2. COPD with chronic bronchitis and emphysema (HCC)  Anoro Ellipta  >>> Take 1 puff daily in the morning right when you wake up >>>Rinse your mouth out after use >>>This is a daily maintenance inhaler, NOT a rescue inhaler >>>Contact our office if you are having difficulties affording or obtaining this medication >>>It is important for you to be able to take this daily and not miss any doses   Note your daily symptoms > remember "red flags" for COPD:   >>>Increase in cough >>>increase in sputum production >>>increase in shortness of breath or activity  intolerance.   If you notice these symptoms, please call the office to be seen.   [employer] for you paperwork provided for patient today Samples provided of Anoro today  High-dose flu vaccine today Pneumovax 23 today   3. Chronic respiratory failure with hypoxia (HCC)  Continue oxygen therapy as prescribed - 2L with exertion  >>>maintain oxygen saturations greater than 88 percent  >>>if unable to maintain oxygen saturations please contact the office  >>>do not smoke with oxygen  >>>can use nasal saline gel or nasal saline rinses to moisturize nose if oxygen causes dryness   4. Healthcare maintenance  Pneumovax 23 today High-dose flu vaccine today  5. Medication management  Patient is likely in donuthole due to high cost of medications  Samples of Anoro Ellipta provided today Coupon for [employer] provided today [place] for you paperwork provided today   Follow Up:    Return in about 3 months (around 10/13/2019), or if symptoms worsen or fail to improve, for Follow up with Dr. Elsworth Soho, Follow up with Kyle Quaker FNP-C.   Please do your part to reduce the spread of COVID-19:      Reduce your risk of any  infection  and COVID19 by using the similar precautions used for avoiding the common cold or flu:  Marland Kitchen Wash your hands often with soap and warm water for at least 20 seconds.  If soap and water are not readily available, use an alcohol-based hand sanitizer with at least 60% alcohol.  . If coughing or sneezing, cover your mouth and nose by coughing or sneezing into the elbow areas of your shirt or coat, into a tissue or into your sleeve (not your hands). Langley Gauss A MASK when in public  . Avoid shaking hands with others and consider head nods or verbal greetings only. . Avoid touching your eyes, nose, or mouth with unwashed hands.  . Avoid close contact with people who are sick. . Avoid places or events with large numbers of people in one location, like concerts or sporting events. . If you have some symptoms but not all symptoms, continue to monitor at home and seek medical attention if your symptoms worsen. . If you are having a medical emergency, call 911.   [place] / e-Visit: [email or web address]         [facility]: [phone]  [facility]: [id number]                   [facility]: [id number]     It is flu season:   >>> Best ways to protect herself from the flu: Receive the yearly  flu vaccine, practice good hand hygiene washing with soap and also using hand sanitizer when available, eat a nutritious meals, get adequate rest, hydrate appropriately   Please contact the office if your symptoms worsen or you have concerns that you are not improving.   Thank you for choosing [facility] for your healthcare, and for allowing [place] to partner with you on your healthcare journey. I am thankful to be able to provide care to you today.   Kyle Quaker FNP-C   Pneumococcal Vaccine, Polyvalent solution for injection What is this medicine?  PNEUMOCOCCAL VACCINE, POLYVALENT (NEU mo KOK al vak SEEN, pol ee VEY luhnt) is a vaccine to prevent pneumococcus bacteria infection. These bacteria are a major cause of ear infections, Strep throat infections, and serious pneumonia, meningitis, or blood infections worldwide. These vaccines help the body to produce antibodies (protective substances) that help your body defend against these bacteria. This vaccine is recommended for people 71 years of age and older with health problems. It is also recommended for all adults over 2 years old. This vaccine will not treat an infection. This medicine may be used for other purposes; ask your health care provider or pharmacist if you have questions. COMMON BRAND NAME(S): Pneumovax 23 What should I tell my health care provider before I take this medicine? They need to know if you have any of these conditions:  bleeding problems  bone marrow or organ transplant  cancer, Hodgkin's disease  fever  infection  immune system problems  low platelet count in the blood  seizures  an unusual or allergic reaction to pneumococcal vaccine, diphtheria toxoid, other vaccines, latex, other medicines, foods, dyes, or preservatives  pregnant or trying to get pregnant  breast-feeding How should I use this medicine? This vaccine is for injection into a muscle or under the skin. It is given by a health care professional. A copy of Vaccine Information Statements will be given before each vaccination. Read this sheet carefully each time. The sheet may change frequently. Talk to your pediatrician regarding the use of this medicine in children. While this drug may be prescribed for children as young as 34 years of age for selected conditions, precautions do apply. Overdosage: If you think you have taken too much of this medicine contact a poison control center or emergency room at once. NOTE: This medicine is only for you. Do not share this medicine with others. What  if I miss a dose? It is important not to miss your dose. Call your doctor or health care professional if you are unable to keep an appointment. What may interact with this medicine?  medicines for cancer chemotherapy  medicines that suppress your immune function  medicines that treat or prevent blood clots like warfarin, enoxaparin, and dalteparin  steroid medicines like prednisone or cortisone This list may not describe all possible interactions. Give your health care provider a list of all the medicines, herbs, non-prescription drugs, or dietary supplements you use. Also tell them if you smoke, drink alcohol, or use illegal drugs. Some items may interact with your medicine. What should I watch for while using this medicine? Mild fever and pain should go away in 3 days or less. Report any unusual symptoms to your doctor or health care professional. What side effects may I notice from receiving this medicine? Side effects that you should report to your doctor or health care professional as soon as possible:  allergic reactions like skin rash, itching or hives, swelling of the  face, lips, or tongue  breathing problems  confused  fever over 102 degrees F  pain, tingling, numbness in the hands or feet  seizures  unusual bleeding or bruising  unusual muscle weakness Side effects that usually do not require medical attention (report to your doctor or health care professional if they continue or are bothersome):  aches and pains  diarrhea  fever of 102 degrees F or less  headache  irritable  loss of appetite  pain, tender at site where injected  trouble sleeping This list may not describe all possible side effects. Call your doctor for medical advice about side effects. You may report side effects to FDA at 1-800-FDA-1088. Where should I keep my medicine? This does not apply. This vaccine is given in a clinic, pharmacy, doctor's office, or other health care setting and  will not be stored at home. NOTE: This sheet is a summary. It may not cover all possible information. If you have questions about this medicine, talk to your doctor, pharmacist, or health care provider.  2020 Elsevier/Gold Standard (2008-05-18 14:32:37)    Influenza Virus Vaccine injection What is this medicine? INFLUENZA VIRUS VACCINE (in floo EN zuh VAHY ruhs vak SEEN) helps to reduce the risk of getting influenza also known as the flu. The vaccine only helps protect you against some strains of the flu. This medicine may be used for other purposes; ask your health care provider or pharmacist if you have questions. COMMON BRAND NAME(S): Afluria, Afluria Quadrivalent, Agriflu, Alfuria, FLUAD, Fluarix, Fluarix Quadrivalent, Flublok, Flublok Quadrivalent, FLUCELVAX, Flulaval, Fluvirin, Fluzone, Fluzone High-Dose, Fluzone Intradermal What should I tell my health care provider before I take this medicine? They need to know if you have any of these conditions:  bleeding disorder like hemophilia  fever or infection  Guillain-Barre syndrome or other neurological problems  immune system problems  infection with the human immunodeficiency virus (HIV) or AIDS  low blood platelet counts  multiple sclerosis  an unusual or allergic reaction to influenza virus vaccine, latex, other medicines, foods, dyes, or preservatives. Different brands of vaccines contain different allergens. Some may contain latex or eggs. Talk to your doctor about your allergies to make sure that you get the right vaccine.  pregnant or trying to get pregnant  breast-feeding How should I use this medicine? This vaccine is for injection into a muscle or under the skin. It is given by a health care professional. A copy of Vaccine Information Statements will be given before each vaccination. Read this sheet carefully each time. The sheet may change frequently. Talk to your healthcare provider to see which vaccines are right  for you. Some vaccines should not be used in all age groups. Overdosage: If you think you have taken too much of this medicine contact a poison control center or emergency room at once. NOTE: This medicine is only for you. Do not share this medicine with others. What if I miss a dose? This does not apply. What may interact with this medicine?  chemotherapy or radiation therapy  medicines that lower your immune system like etanercept, anakinra, infliximab, and adalimumab  medicines that treat or prevent blood clots like warfarin  phenytoin  steroid medicines like prednisone or cortisone  theophylline  vaccines This list may not describe all possible interactions. Give your health care provider a list of all the medicines, herbs, non-prescription drugs, or dietary supplements you use. Also tell them if you smoke, drink alcohol, or use illegal drugs. Some items may interact  with your medicine. What should I watch for while using this medicine? Report any side effects that do not go away within 3 days to your doctor or health care professional. Call your health care provider if any unusual symptoms occur within 6 weeks of receiving this vaccine. You may still catch the flu, but the illness is not usually as bad. You cannot get the flu from the vaccine. The vaccine will not protect against colds or other illnesses that may cause fever. The vaccine is needed every year. What side effects may I notice from receiving this medicine? Side effects that you should report to your doctor or health care professional as soon as possible:  allergic reactions like skin rash, itching or hives, swelling of the face, lips, or tongue Side effects that usually do not require medical attention (report to your doctor or health care professional if they continue or are bothersome):  fever  headache  muscle aches and pains  pain, tenderness, redness, or swelling at the injection site  tiredness This list  may not describe all possible side effects. Call your doctor for medical advice about side effects. You may report side effects to FDA at 1-800-FDA-1088. Where should I keep my medicine? The vaccine will be given by a health care professional in a clinic, pharmacy, doctor's office, or other health care setting. You will not be given vaccine doses to store at home. NOTE: This sheet is a summary. It may not cover all possible information. If you have questions about this medicine, talk to your doctor, pharmacist, or health care provider.  2020 Elsevier/Gold Standard (2018-09-06 08:45:43)

## 2019-07-14 NOTE — Assessment & Plan Note (Signed)
Plan: Continue Anoro Ellipta [employer] for you paperwork provided for patient today Samples of Anoro Ellipta provided today Coupon for Anoro Ellipta provided today High-dose flu vaccine today Pneumovax 23 today

## 2019-07-14 NOTE — Assessment & Plan Note (Signed)
Patient likely in donut hole High cost of medications for insulin, Eliquis and Anoro Ellipta  Plan: Continue Anoro Ellipta [employer] for you paperwork provided today Sample of Anoro Ellipta provided today Coupon for [employer] provided today  We will need to reapply for [place] for you in 2021 if qualified

## 2019-07-14 NOTE — Assessment & Plan Note (Signed)
Plan: High-dose flu vaccine today Pneumovax 23 today

## 2019-07-14 NOTE — Assessment & Plan Note (Signed)
Plan: Walk today in office Maintain oxygen as prescribed Maintain oxygen saturations greater than 90%

## 2019-07-14 NOTE — Assessment & Plan Note (Signed)
Plan: Continue to eat a healthy well-balanced diet Drink 1-2 high-protein boost or Ensure in between standard meals Remain adequately hydrated

## 2019-07-14 NOTE — Addendum Note (Signed)
Addended by: Valerie Salts on: 07/14/2019 04:37 PM   Modules accepted: Orders

## 2019-07-30 ENCOUNTER — Other Ambulatory Visit: Payer: Self-pay | Admitting: Pulmonary Disease

## 2019-08-07 ENCOUNTER — Telehealth: Payer: Self-pay | Admitting: Pulmonary Disease

## 2019-08-07 MED ORDER — ANORO ELLIPTA 62.5-25 MCG/INH IN AEPB: 1.0000 | INHALATION_SPRAY | Freq: Every day | RESPIRATORY_TRACT | 2 refills | Status: DC

## 2019-08-07 NOTE — Telephone Encounter (Signed)
Patient was seen by Aaron Edelman on 9/18 and was given paperwork for patient assistance for Anoro. Patient filled out his portion and returned the forms back to the office today. Completed the forms and faxed them to the [place] patient assistance program.   Will keep all originals in my purple folder for 30 days before sending everything to scan.

## 2019-08-21 ENCOUNTER — Telehealth: Payer: Self-pay

## 2019-08-21 DIAGNOSIS — Z79899 Other long term (current) drug therapy: Secondary | ICD-10-CM

## 2019-08-21 MED ORDER — DABIGATRAN ETEXILATE MESYLATE 150 MG PO CAPS: 150.0000 mg | ORAL_CAPSULE | Freq: Two times a day (BID) | ORAL | 0 refills | Status: DC

## 2019-08-21 NOTE — Telephone Encounter (Signed)
Spoke to pt's wife per DPR and let her know of the pharmacists recommendations with the medication change. Put in lab order. Pt wife verbalized understanding.

## 2019-08-21 NOTE — Telephone Encounter (Signed)
Spoke with Varney Biles at [employer]. She stated that the patient has been approved for Anoro starting 08/09/19 until 10/26/19. Patient will need to re-apply after the 1st of the year. His Anoro was shipped on 08/10/19 per [place].   Attempted to call patient to make sure he had received his medication. Someone answered the phone but did not say anything. Will attempt to call back later.

## 2019-08-21 NOTE — Telephone Encounter (Signed)
Put in orders and pt verbalized understanding. [address] scheduling for appt with Kyle [name].

## 2019-08-22 ENCOUNTER — Telehealth: Payer: Self-pay

## 2019-08-22 NOTE — Telephone Encounter (Signed)
Spoke to patient's wife about recent email.She stated husband cannot afford Pradaxa.Stated it cost more than Eliquis.Advised I will leave Eliquis 5 mg samples and a patient assistance form for Eliquis at front desk.Advised to bring back completed form and proof of income to office and I will fax to [facility].

## 2019-09-11 ENCOUNTER — Ambulatory Visit: Payer: Medicare HMO | Admitting: Cardiology

## 2019-09-13 ENCOUNTER — Telehealth: Payer: Self-pay | Admitting: Cardiology

## 2019-09-13 ENCOUNTER — Telehealth: Payer: Self-pay

## 2019-09-13 NOTE — Telephone Encounter (Signed)
Called patient left message on personal voice mail to call me back regarding patient assistance form for Eliquis.I have not received your portion.

## 2019-09-13 NOTE — Telephone Encounter (Signed)
Patient's wife returning call from [place] in regards to her husband's patient assistance for eliquis.

## 2019-09-13 NOTE — Telephone Encounter (Signed)
Returned call to patient's wife.She stated Allyn brought patient assistance form back to office appox 3 weeks ago.Advised I never received form.I will mail a new form to be completed.Stated he will bring back to office in envelop addressed to me.

## 2019-09-14 ENCOUNTER — Telehealth: Payer: Self-pay | Admitting: Cardiology

## 2019-09-14 NOTE — Telephone Encounter (Signed)
Patient's wife calling to speak with Malachy Mood in regards to her husbands patient assistance for eliquis. Asking that Malachy Mood please give her a call back.

## 2019-09-14 NOTE — Telephone Encounter (Signed)
Returned call to patient's wife she stated Davante brought patient assistance form back to office appox 3 weeks ago.Advised I spoke to front desk and no one has any papers.Advised I mailed another patient assistance form yesterday and enclosed a envelop with my name on it.Wife stated they will bring back to office when completed.

## 2019-09-19 ENCOUNTER — Telehealth: Payer: Self-pay

## 2019-09-19 NOTE — Telephone Encounter (Signed)
Received patient's patient assistance form and proof of income.All forms faxed to [employer] patient assistance for Eliquis at fax # [phone].

## 2019-09-25 NOTE — Telephone Encounter (Signed)
Follow Up  [name] is calling in reference to previous msg.   Please call to discuss.

## 2019-09-25 NOTE — Telephone Encounter (Signed)
Received a call from patient's wife this morning.Advised I have not heard back from Eliquis patient assistance.Eliquis 5 mg samples left at [employer] office front desk.

## 2019-10-13 ENCOUNTER — Ambulatory Visit: Payer: Medicare HMO | Admitting: Pulmonary Disease

## 2019-10-13 ENCOUNTER — Other Ambulatory Visit: Payer: Self-pay

## 2019-10-13 ENCOUNTER — Encounter: Payer: Self-pay | Admitting: Pulmonary Disease

## 2019-10-13 VITALS — BP 128/74 | HR 75 | Temp 98.4°F | Ht 70.0 in | Wt 151.0 lb

## 2019-10-13 DIAGNOSIS — J9611 Chronic respiratory failure with hypoxia: Secondary | ICD-10-CM | POA: Diagnosis not present

## 2019-10-13 DIAGNOSIS — J449 Chronic obstructive pulmonary disease, unspecified: Secondary | ICD-10-CM

## 2019-10-13 DIAGNOSIS — I272 Pulmonary hypertension, unspecified: Secondary | ICD-10-CM | POA: Diagnosis not present

## 2019-10-13 DIAGNOSIS — Z79899 Other long term (current) drug therapy: Secondary | ICD-10-CM

## 2019-10-13 NOTE — Patient Instructions (Addendum)
You were seen today by Lauraine Rinne, NP  for:   1. COPD with chronic bronchitis and emphysema ([place]) 2. Medication management  Anoro Ellipta  >>> Take 1 puff daily in the morning right when you wake up >>>Rinse your mouth out after use >>>This is a daily maintenance inhaler, NOT a rescue inhaler >>>Contact our office if you are having difficulties affording or obtaining this medication >>>It is important for you to be able to take this daily and not miss any doses   Note your daily symptoms > remember "red flags" for COPD:   >>>Increase in cough >>>increase in sputum production >>>increase in shortness of breath or activity  intolerance.   If you notice these symptoms, please call the office to be seen.   Contact our office if you have any difficulty obtaining Anoro Ellipta in January/2021 Reapply to [place] for you  3. Chronic respiratory failure with hypoxia (HCC) 4. Pulmonary hypertension (HCC)  Walk today   Continue oxygen therapy as prescribed  >>>maintain oxygen saturations greater than 88 percent  >>>if unable to maintain oxygen saturations please contact the office  >>>do not smoke with oxygen  >>>can use nasal saline gel or nasal saline rinses to moisturize nose if oxygen causes dryness   5. Healthcare maintenance  Continue to remain physically active Follow-up with our office if you have any concerns or questions regarding your health  6. Protein-calorie malnutrition, severe ([place])  Continue to remain active Continue to eat a consistent source of protein Drink high-protein shake or boost in between meals    Follow Up:    Return in about 6 months (around 04/12/2020), or if symptoms worsen or fail to improve, for Follow up with Dr. Elsworth Soho.   Please do your part to reduce the spread of COVID-19:      Reduce your risk of any infection  and COVID19 by using the similar precautions used for avoiding the common cold or flu:  Marland Kitchen Wash your hands often with soap and  warm water for at least 20 seconds.  If soap and water are not readily available, use an alcohol-based hand sanitizer with at least 60% alcohol.  . If coughing or sneezing, cover your mouth and nose by coughing or sneezing into the elbow areas of your shirt or coat, into a tissue or into your sleeve (not your hands). Langley Gauss A MASK when in public  . Avoid shaking hands with others and consider head nods or verbal greetings only. . Avoid touching your eyes, nose, or mouth with unwashed hands.  . Avoid close contact with people who are sick. . Avoid places or events with large numbers of people in one location, like concerts or sporting events. . If you have some symptoms but not all symptoms, continue to monitor at home and seek medical attention if your symptoms worsen. . If you are having a medical emergency, call 911.   [place] / e-Visit: [email or web address]         [facility]: [facility]: [id number]                   [facility]: [id number]     It is flu season:   >>> Best ways to protect herself from the flu: Receive the yearly flu vaccine, practice good hand hygiene washing with soap and also using hand sanitizer when available, eat a nutritious meals, get adequate rest,  hydrate appropriately   Please contact the office if your symptoms worsen or you have concerns that you are not improving.   Thank you for choosing [facility] for your healthcare, and for allowing [place] to partner with you on your healthcare journey. I am thankful to be able to provide care to you today.   [name] FNP-C

## 2019-10-13 NOTE — Assessment & Plan Note (Signed)
Plan: Continue Anoro Ellipta Reapply for [employer] for you Notify our office if you are unable to obtain access to Anoro Ellipta in January/2021 Follow-up with our office in 6 months 

## 2019-10-13 NOTE — Assessment & Plan Note (Signed)
Plan: Walk today in office -tolerated walk in office with no oxygen Maintain oxygen saturations greater than 90% 

## 2019-10-13 NOTE — Progress Notes (Signed)
@Patient  ID: Kyle [name], male    DOB: 1951-10-10, 68 y.o.   MRN: [id number]  No chief complaint on file.   Referring provider: Alonna Buckler, MD  HPI:  68 year old male former smoker followed in our office for COPD  PMH: Hypertension, dilated cardiomyopathy, CHF, pulmonary hypertension, dyslipidemia Smoker/ Smoking History: Former smoker.  Quit smoking December/2019.  40-pack-year smoking history. Maintenance:  Anoro Ellipta  Pt of: Dr. Elsworth Soho  10/13/2019  - Visit   68 year old male former smoker followed in our office for COPD.  Patient does have a history of exertional hypoxemia.  He presents today on room air oxygen saturations 96%.  At last office visit in September/2020 he was walked and required 2 L of O2.  He reports that he has not had any oxygen desaturations.  Patient reports he has been maintained well on Anoro Ellipta.  He received paperwork from [place] for you to reapply for calendar year 2021.  Patient with no acute complaints at this time.  Questionaires / Pulmonary Flowsheets:   MMRC: mMRC Dyspnea Scale mMRC Score  10/13/2019 0  07/14/2019 0    Tests:   11/26/2018-CTA chest-negative for PE, trace pleural effusions, atelectasis in bases right greater than left  11/20/2018-CT head without contrast- sinuses normal 12/16/2018-spirometry- FVC 1.7 (43% predicted), ratio 64, FEV1 1.1 (36% predicted), consistent with severe airway obstruction  11/21/2018-echocardiogram-LV ejection fraction 40 to 45%, diffuse hypokinesis, right ventricle moderately dilated, systolic function was moderately reduced, PA P pressure 53  FENO:  No results found for: NITRICOXIDE  PFT: No flowsheet data found.  WALK:  SIX MIN WALK 10/13/2019 07/14/2019 05/15/2019 12/16/2018  Supplimental Oxygen during Test? (L/min) No Yes Yes Yes  O2 Flow Rate - 2 2 4   Type - Continuous Continuous Continuous  Tech Comments: Patient was able to complete 2 laps at a moderate pace. Denied any SOB or  chest pain. No O2 needed during or after walk. Patient was able to complete the first lap without  O2. He started the 2nd lap and O2 dropped to 83%, HR 81. He was placed on 2L, recovered to 95% HR75. Patient was able to finish lap at 97% HR82. Denied any SOB, chest or leg pain. - slower pace due to unsteady gait.  Patient began off O2 but desaturated at about 1/2 a lap to 85%.  O2 titrated from 2L to 4L to maintain SpO2 above 90%.  Completed walk on 4L O2.  Mild to Mod SOB noted most of the walk.  [name] RN    Imaging: No results found.  Lab Results:  CBC    Component Value Date/Time   WBC 10.7 (H) 04/25/2019 0413   RBC 4.46 04/25/2019 0413   HGB 13.5 04/25/2019 0413   HCT 41.2 04/25/2019 0413   PLT 146 (L) 04/25/2019 0413   MCV 92.4 04/25/2019 0413   MCH 30.3 04/25/2019 0413   MCHC 32.8 04/25/2019 0413   RDW 15.3 04/25/2019 0413   LYMPHSABS 2.0 11/20/2018 1927   MONOABS 0.8 11/20/2018 1927   EOSABS 0.0 11/20/2018 1927   BASOSABS 0.0 11/20/2018 1927    BMET    Component Value Date/Time   NA 139 04/25/2019 0413   K 4.6 04/25/2019 0413   CL 107 04/25/2019 0413   CO2 24 04/25/2019 0413   GLUCOSE 138 (H) 04/25/2019 0413   BUN 20 04/25/2019 0413   CREATININE 1.49 (H) 04/25/2019 0413   CREATININE 0.99 11/12/2014 1344   CALCIUM 8.8 (L) 04/25/2019 0413  GFRNONAA 48 (L) 04/25/2019 0413   GFRAA 55 (L) 04/25/2019 0413    BNP    Component Value Date/Time   BNP 709.2 (H) 11/20/2018 2341   BNP 997.9 (H) 11/09/2014 1333    ProBNP    Component Value Date/Time   PROBNP 483.0 (H) 10/19/2008 0328    Specialty Problems      Pulmonary Problems   Chronic respiratory failure with hypoxia (HCC)   COPD with chronic bronchitis and emphysema (HCC)    Severe -FEV1 36%         No Known Allergies  Immunization History  Administered Date(s) Administered  . Fluad Quad(high Dose 65+) 07/14/2019  . Influenza-Unspecified 01/26/2017, 08/10/2017  . Pneumococcal  Conjugate-13 01/20/2018  . Pneumococcal Polysaccharide-23 07/14/2019    Past Medical History:  Diagnosis Date  . Aortic insufficiency    MODERATE WITH A BICUSPID AORTIC VAVLE  . Arterial occlusive disease    MULTILEVEL  . CHF (congestive heart failure) ([place])   . COPD (chronic obstructive pulmonary disease) ([place])   . Dilated cardiomyopathy (HCC)    WITH EJECTION FRACTION DOWN TO 20-25%--WITH CONGESTIVE HEART FAILURE  . Edema    LOWER EXTREMETIES  . Hypertension   . Normal coronary arteries 2009  . Orthopnea   . Peripheral arterial disease ([place])   . Pulmonary hypertension ([place])   . SOB (shortness of breath)     Tobacco History: Social History   Tobacco Use  Smoking Status Former Smoker  . Packs/day: 1.00  . Years: 40.00  . Pack years: 40.00  . Quit date: 10/15/2018  . Years since quitting: 0.9  Smokeless Tobacco Never Used  Tobacco Comment   quit 2010 and quit again 2019   Counseling given: Not Answered Comment: quit 2010 and quit again 2019   Continue to not smoke  Outpatient Encounter Medications as of 10/13/2019  Medication Sig  . ACCU-CHEK FASTCLIX LANCETS MISC   . ACCU-CHEK GUIDE test strip   . amLODipine (NORVASC) 5 MG tablet Take 1 tablet (5 mg total) by mouth daily.  Jearl Klinefelter ELLIPTA 62.5-25 MCG/INH AEPB INHALE 1 PUFF BY MOUTH ONCE DAILY . APPOINTMENT REQUIRED FOR FUTURE REFILLS  . apixaban (ELIQUIS) 5 MG TABS tablet Take 1 tablet (5 mg total) by mouth 2 (two) times daily.  . BD PEN NEEDLE NANO U/F 32G X 4 MM MISC   . carvedilol (COREG) 12.5 MG tablet TAKE 1 TABLET TWICE DAILY WITH MEALS (Patient taking differently: Take 12.5 mg by mouth 2 (two) times a day. )  . furosemide (LASIX) 40 MG tablet TAKE 1 TABLET (40 MG TOTAL) BY MOUTH DAILY.  Marland Kitchen guaiFENesin (MUCINEX) 600 MG 12 hr tablet Take 2 tablets (1,200 mg total) by mouth 2 (two) times daily. (Patient taking differently: Take 600 mg by mouth 2 (two) times daily. )  . insulin glargine (LANTUS) 100 UNIT/ML  injection Inject 15 Units into the skin at bedtime.  . metFORMIN (GLUCOPHAGE) 500 MG tablet Take 500 mg by mouth 2 (two) times a day.   . pravastatin (PRAVACHOL) 40 MG tablet TAKE 1 TABLET AT BEDTIME (Patient taking differently: Take 40 mg by mouth at bedtime. TAKE 1 TABLET AT BEDTIME)  . quinapril (ACCUPRIL) 10 MG tablet TAKE 1 TABLET (10 MG TOTAL) DAILY. (Patient taking differently: Take 10 mg by mouth daily. )  . umeclidinium-vilanterol (ANORO ELLIPTA) 62.5-25 MCG/INH AEPB Inhale 1 puff into the lungs daily.   No facility-administered encounter medications on file as of 10/13/2019.     Review of  Systems  Review of Systems  Constitutional: Negative for activity change, chills, fatigue, fever and unexpected weight change.  HENT: Negative for postnasal drip, rhinorrhea, sinus pressure, sinus pain and sore throat.   Eyes: Negative.   Respiratory: Negative for cough, shortness of breath and wheezing.   Cardiovascular: Negative for chest pain and palpitations.  Gastrointestinal: Negative for constipation, diarrhea, nausea and vomiting.  Endocrine: Negative.   Genitourinary: Negative.   Musculoskeletal: Negative.   Skin: Negative.   Neurological: Negative for dizziness and headaches.  Psychiatric/Behavioral: Negative.  Negative for dysphoric mood. The patient is not nervous/anxious.   All other systems reviewed and are negative.    Physical Exam  BP 128/74 (BP Location: Left Arm, Patient Position: Sitting, Cuff Size: Normal)   Pulse 75   Temp 98.4 F (36.9 C) (Temporal)   Ht 5\' 10"  (1.778 m)   Wt 151 lb (68.5 kg)   SpO2 96% Comment: on RA  BMI 21.67 kg/m   Wt Readings from Last 5 Encounters:  10/13/19 151 lb (68.5 kg)  07/14/19 151 lb (68.5 kg)  05/15/19 151 lb (68.5 kg)  04/22/19 148 lb 9.6 oz (67.4 kg)  04/21/19 151 lb (68.5 kg)    BMI Readings from Last 5 Encounters:  10/13/19 21.67 kg/m  07/14/19 21.67 kg/m  05/15/19 21.67 kg/m  04/22/19 20.73 kg/m  04/21/19  21.67 kg/m     Physical Exam Vitals and nursing note reviewed.  Constitutional:      General: He is not in acute distress.    Appearance: Normal appearance. He is normal weight.  HENT:     Head: Normocephalic and atraumatic.     Right Ear: Hearing, tympanic membrane, ear canal and external ear normal. There is no impacted cerumen.     Left Ear: Hearing, tympanic membrane, ear canal and external ear normal. There is no impacted cerumen.     Nose: Nose normal. No mucosal edema, congestion or rhinorrhea.     Right Turbinates: Not enlarged.     Left Turbinates: Not enlarged.     Mouth/Throat:     Mouth: Mucous membranes are dry.     Pharynx: Oropharynx is clear. No oropharyngeal exudate.  Eyes:     Pupils: Pupils are equal, round, and reactive to light.  Cardiovascular:     Rate and Rhythm: Normal rate and regular rhythm.     Pulses: Normal pulses.     Heart sounds: Normal heart sounds. No murmur.  Pulmonary:     Effort: Pulmonary effort is normal.     Breath sounds: No decreased breath sounds, wheezing or rales.     Comments: Distant breath sounds Musculoskeletal:     Cervical back: Normal range of motion.     Right lower leg: No edema.     Left lower leg: No edema.  Lymphadenopathy:     Cervical: No cervical adenopathy.  Skin:    General: Skin is warm and dry.     Capillary Refill: Capillary refill takes less than 2 seconds.     Findings: No erythema or rash.  Neurological:     General: No focal deficit present.     Mental Status: He is alert and oriented to person, place, and time.     Motor: No weakness.     Coordination: Coordination normal.     Gait: Gait is intact. Gait (Patient tolerated walk in office today) normal.  Psychiatric:        Mood and Affect: Mood normal.  Behavior: Behavior normal. Behavior is cooperative.        Thought Content: Thought content normal.        Judgment: Judgment normal.       Assessment & Plan:   Pulmonary hypertension  ([place]) Plan: Walk today in office -tolerated walk in office with no oxygen Maintain oxygen saturations greater than 90%  Chronic respiratory failure with hypoxia (HCC) Plan: Walk today-patient tolerated walk today with no oxygen desaturations Maintain oxygen saturations greater than 90%  COPD with chronic bronchitis and emphysema (HCC) Plan: Continue Anoro Ellipta Reapply for [employer] for you Notify our office if you are unable to obtain access to Anoro Ellipta in January/2021 Follow-up with our office in 6 months  Medication management Plan: Reapply for [place] for you Notify our office if you are unable to receive access to Anoro Ellipta for calendar year 2021    Return in about 6 months (around 04/12/2020), or if symptoms worsen or fail to improve, for Follow up with Dr. Elsworth Soho.   Lauraine Rinne, NP 10/13/2019   This appointment was26 minutes long with over 50% of the time in direct face-to-face patient care, assessment, plan of care, and follow-up.

## 2019-10-13 NOTE — Assessment & Plan Note (Signed)
Plan: Walk today-patient tolerated walk today with no oxygen desaturations Maintain oxygen saturations greater than 90%

## 2019-10-13 NOTE — Assessment & Plan Note (Signed)
Plan: Reapply for [place] for you Notify our office if you are unable to receive access to [facility] for calendar year 2021

## 2019-10-31 ENCOUNTER — Telehealth: Payer: Self-pay | Admitting: Cardiology

## 2019-10-31 NOTE — Telephone Encounter (Signed)
Per last telephone note. Patient's wife is following up on Eliquis patient assistance. States that her husband is out of this medication.

## 2019-11-01 NOTE — Telephone Encounter (Signed)
Spoke to patient's wife.Advised I never heard back from patient assistance.Eliquis 5 mg samples left at front desk.Application for Eliquis re faxed to [employer] at fax # [phone].

## 2019-11-13 ENCOUNTER — Other Ambulatory Visit: Payer: Self-pay | Admitting: *Deleted

## 2019-11-13 DIAGNOSIS — Z87891 Personal history of nicotine dependence: Secondary | ICD-10-CM

## 2019-11-14 NOTE — Telephone Encounter (Signed)
Left message on patient's personal voice mail, received a letter from [facility] Patient Assistance stating not eligible for assistance with Eliquis.Eliquis covered by your insurance.

## 2019-11-21 LAB — BASIC METABOLIC PANEL WITH GFR
BUN/Creatinine Ratio: 15 (ref 10–24)
BUN: 22 mg/dL (ref 8–27)
CO2: 22 mmol/L (ref 20–29)
Calcium: 9.7 mg/dL (ref 8.6–10.2)
Chloride: 102 mmol/L (ref 96–106)
Creatinine, Ser: 1.44 mg/dL — ABNORMAL HIGH (ref 0.76–1.27)
GFR calc Af Amer: 58 mL/min/1.73 — ABNORMAL LOW
GFR calc non Af Amer: 50 mL/min/1.73 — ABNORMAL LOW
Glucose: 142 mg/dL — ABNORMAL HIGH (ref 65–99)
Potassium: 4.5 mmol/L (ref 3.5–5.2)
Sodium: 143 mmol/L (ref 134–144)

## 2019-11-28 NOTE — Progress Notes (Signed)
Kyle [name] Date of Birth: 1951-02-22 Medical Record [id number]  History of Present Illness: Kyle [name] is seen for follow up CHF.  He has a history of systolic CHF with EF of 0000000 in May 2012. Coronary anatomy was normal by cath. He has a bicuspid AV with moderate AS/AI. He had moderate pulmonary HTN. He was treated medically and one of my old notes stated that his LV function showed marked improvement with  Echo in 2011 showing improvement in EF to 55-60%.    Unfortunately , he was lost to follow up and was not taking medication for over 2 years. He also has a history of HTN, hyperlipidemia, and PAD s/p right fem-pop BPG.   When seen in October 2015 he had evidence of recurrent CHF with severe HTN. Echo showed an EF of 20-25% with moderate AS/AI. He was started back on medication with lasix, carvedilol, and quinapril. He did not come back for follow up as instructed. He was seen in Jan. 2016 for surgical clearance for vascular surgery.  He was seen by [name] for claudication and was found to have severe PAD. Revascularization recommended. He underwent aortobifemoral BPG in February 2016. Last seen by me in April 2019- had missed multiple appointments. He did have an Echo in July 2018 showing normalization of LV function with EF 55%.  He is  diabetic and on insulin.  He was admitted in January 2020 with hypotension and shock. Had malaise, N/V and sweats. Treated with antibiotics. Had respiratory failure with hypoxia. Sent home with oxygen. He had Afib initially then converted to NSR. Started on Eliquis. Continued on Coreg. Had AKI with creatinine up to 2.7 that improved to 1.03. LFTs were elevated. Echo showed EF 40-45%. There was RV dysfunction with moderate pulmonary HTN. Moderate AI with mild AS. CT negative for PE but showed advanced emphysema.   He presented in June 2020 with progressive right leg pain and nonhealing wound on his foot. Angiography showed Occluded graft to the right  leg and only peroneal run off. He underwent the following surgery on 04/24/19 by Dr Donnetta Hutching. Right femoral and repair with interposition 8 mm Hemashield graft from prior right limb of aortofemoral bypass into into deep femoral artery, #2 right femoral to peroneal bypass with translocated non-reversed great saphenous vein harvested from the left leg. He has not followed up with vascular surgery or cardiology post surgery. Has only followed up with pulmonary for COPD with hypoxemia and pulmonary HTN. BP was well controlled on his last visit there in Dec.   On follow up today he reports he is feeling well. Wears oxygen intermittently at home. Has not smoked in 2 years. Thinks breathing is better. No edema. No chest pain. Denies any foot pain or claudication.   Allergies as of 11/30/2019   No Known Allergies     Medication List       Accurate as of November 30, 2019  9:16 AM. If you have any questions, ask your nurse or doctor.        Accu-Chek FastClix Lancets Misc   Accu-Chek FastClix Lancets Misc USE TO MONITOR BLOOD GLUCOSE 2 TIMES DAILY   Accu-Chek Guide test strip Generic drug: glucose blood   Accu-Chek Guide test strip Generic drug: glucose blood one strip (1 each dose) by Other route 2 (two) times daily. Test blood glucose 2 times a day.   amLODipine 5 MG tablet Commonly known as: NORVASC Take 1 tablet (5 mg total) by mouth daily.  Anoro Ellipta 62.5-25 MCG/INH Aepb Generic drug: umeclidinium-vilanterol INHALE 1 PUFF BY MOUTH ONCE DAILY . APPOINTMENT REQUIRED FOR FUTURE REFILLS   Anoro Ellipta 62.5-25 MCG/INH Aepb Generic drug: umeclidinium-vilanterol Inhale 1 puff into the lungs daily.   BD Pen Needle Nano U/F 32G X 4 MM Misc Generic drug: Insulin Pen Needle   BD Pen Needle Nano U/F 32G X 4 MM Misc Generic drug: Insulin Pen Needle USE TO INJECT INTO THE SKIN DAILY.   carvedilol 12.5 MG tablet Commonly known as: COREG TAKE 1 TABLET TWICE DAILY WITH MEALS What changed:  when to take this   Eliquis 5 MG Tabs tablet Generic drug: apixaban Take 1 tablet (5 mg total) by mouth 2 (two) times daily.   furosemide 40 MG tablet Commonly known as: LASIX TAKE 1 TABLET (40 MG TOTAL) BY MOUTH DAILY.   guaiFENesin 600 MG 12 hr tablet Commonly known as: MUCINEX Take 2 tablets (1,200 mg total) by mouth 2 (two) times daily. What changed: how much to take   insulin glargine 100 UNIT/ML injection Commonly known as: LANTUS Inject 15 Units into the skin at bedtime.   Lantus SoloStar 100 UNIT/ML Solostar Pen Generic drug: Insulin Glargine INJECT 15 UNITS SUBCUTANEOUSLY AT BEDTIME   Lantus SoloStar 100 UNIT/ML Solostar Pen Generic drug: Insulin Glargine   metFORMIN 500 MG tablet Commonly known as: GLUCOPHAGE Take 500 mg by mouth 2 (two) times a day.   metFORMIN 500 MG tablet Commonly known as: GLUCOPHAGE TAKE 1 TABLET TWICE DAILY (NEED MD APPOINTMENT)   pneumococcal 13-valent conjugate vaccine Susp injection Commonly known as: PREVNAR 13 Inject into the muscle.   pravastatin 40 MG tablet Commonly known as: PRAVACHOL TAKE 1 TABLET AT BEDTIME What changed: additional instructions   quinapril 10 MG tablet Commonly known as: ACCUPRIL TAKE 1 TABLET (10 MG TOTAL) DAILY. What changed: See the new instructions.        No Known Allergies  Past Medical History:  Diagnosis Date  . Aortic insufficiency    MODERATE WITH A BICUSPID AORTIC VAVLE  . Arterial occlusive disease    MULTILEVEL  . CHF (congestive heart failure) ([place])   . COPD (chronic obstructive pulmonary disease) ([place])   . Dilated cardiomyopathy ([facility])    WITH EJECTION FRACTION DOWN TO 20-25%--WITH CONGESTIVE HEART FAILURE  . Edema    LOWER EXTREMETIES  . Hypertension   . Normal coronary arteries 2009  . Orthopnea   . Peripheral arterial disease ([place])   . Pulmonary hypertension ([place])   . SOB (shortness of breath)     Past Surgical History:  Procedure Laterality Date  . ABDOMINAL  AORTAGRAM N/A 11/05/2014   Procedure: ABDOMINAL Maxcine Ham;  Surgeon: Angelia Mould, MD;  Location: [facility] CATH LAB;  Service: Cardiovascular;  Laterality: N/A;  . ABDOMINAL AORTOGRAM W/LOWER EXTREMITY Bilateral 04/21/2019   Procedure: ABDOMINAL AORTOGRAM W/LOWER EXTREMITY;  Surgeon: Angelia Mould, MD;  Location: [place] CV LAB;  Service: Cardiovascular;  Laterality: Bilateral;  . AORTA - BILATERAL FEMORAL ARTERY BYPASS GRAFT N/A 12/18/2014   Procedure: AORTOBIFEMORAL BYPASS GRAFT;  Surgeon: Angelia Mould, MD;  Location: [place];  Service: Vascular;  Laterality: N/A;  . CARDIAC CATHETERIZATION  2009   Nl Cors, EF 20%  . COLONOSCOPY    . ENDARTERECTOMY FEMORAL Left 12/18/2014   Procedure: ENDARTERECTOMY FEMORAL;  Surgeon: Angelia Mould, MD;  Location: [place];  Service: Vascular;  Laterality: Left;  . FALSE ANEURYSM REPAIR Right 04/24/2019   Procedure: REPAIR OF RIGHT FEMORAL ARTERY PSEUDOANEURYSM;  Surgeon: Rosetta Posner, MD;  Location: [place];  Service: Vascular;  Laterality: Right;  . FEMORAL-POPLITEAL BYPASS GRAFT  02/21/2011   right  . FEMORAL-TIBIAL BYPASS GRAFT Right 04/24/2019   Procedure: REDO BYPASS GRAFT RIGHT FEMORAL-TIBIAL ARTERY USING LEFT LEG VEIN & HEMASHIELD GOLD 13mm GRAFT;  Surgeon: Rosetta Posner, MD;  Location: [place];  Service: Vascular;  Laterality: Right;  . OTHER SURGICAL HISTORY     POST RIGHT FEMOROPOPLITEAL BYPASS GRAFT  . PERIPHERAL VASCULAR CATHETERIZATION  11/05/2014   Procedure: LOWER EXTREMITY ANGIOGRAPHY;  Surgeon: Angelia Mould, MD;  Location: [facility] CATH LAB;  Service: Cardiovascular;;  . VEIN HARVEST Left 04/24/2019   Procedure: LEFT LEG SAPHENOUS VEIN HARVEST;  Surgeon: Rosetta Posner, MD;  Location: [facility] OR;  Service: Vascular;  Laterality: Left;    Social History   Socioeconomic History  . Marital status: Married    Spouse name: Not on file  . Number of children: 1  . Years of education: Not on file  . Highest education level: Not  on file  Occupational History    Employer: [employer]  Tobacco Use  . Smoking status: Former Smoker    Packs/day: 1.00    Years: 40.00    Pack years: 40.00    Quit date: 10/15/2018    Years since quitting: 1.1  . Smokeless tobacco: Never Used  . Tobacco comment: quit 2010 and quit again 2019  Substance and Sexual Activity  . Alcohol use: No    Alcohol/week: 0.0 standard drinks  . Drug use: No  . Sexual activity: Not on file  Other Topics Concern  . Not on file  Social History Narrative  . Not on file   Social Determinants of Health   Financial Resource Strain:   . Difficulty of Paying Living Expenses: Not on file  Food Insecurity:   . Worried About Charity fundraiser in the Last Year: Not on file  . Ran Out of Food in the Last Year: Not on file  Transportation Needs:   . Lack of Transportation (Medical): Not on file  . Lack of Transportation (Non-Medical): Not on file  Physical Activity:   . Days of Exercise per Week: Not on file  . Minutes of Exercise per Session: Not on file  Stress:   . Feeling of Stress : Not on file  Social Connections:   . Frequency of Communication with Friends and Family: Not on file  . Frequency of Social Gatherings with Friends and Family: Not on file  . Attends Religious Services: Not on file  . Active Member of Clubs or Organizations: Not on file  . Attends Archivist Meetings: Not on file  . Marital Status: Not on file    Family History  Problem Relation Age of Onset  . Diabetes Mother   . Hyperlipidemia Sister   . Hypertension Sister     Review of Systems: As noted in HPI.   All other systems were reviewed and are negative.  Physical Exam: BP 130/62 (BP Location: Right Arm, Cuff Size: Normal)   Pulse 79   Temp (!) 97.2 F (36.2 C)   Ht 5\' 11"  (1.803 m)   Wt 158 lb (71.7 kg)   SpO2 97%   BMI 22.04 kg/m  Filed Weights   11/30/19 0858  Weight: 158 lb (71.7 kg)    GENERAL:  Well appearing, thin BM in  NAD HEENT:  PERRL, EOMI, sclera are clear. Oropharynx is clear. NECK:  No  jugular venous distention, carotid upstroke brisk and symmetric, no bruits, no thyromegaly or adenopathy LUNGS:  Clear to auscultation bilaterally CHEST:  Unremarkable HEART:  RRR,  PMI not displaced or sustained,S1 and S2 within normal limits, no S3, no S4: no clicks, no rubs, gr 1/6 systolic and diastolic murmur RUSB.  ABD:  Soft, nontender. BS +, no masses or bruits. No hepatomegaly, no splenomegaly EXT:  Pedal pulses are weak.   no edema, no cyanosis no clubbing SKIN:  Warm and dry.  No rashes NEURO:  Alert and oriented x 3. Cranial nerves II through XII intact. PSYCH:  Cognitively intact   LABORATORY DATA: . Lab Results  Component Value Date   WBC 10.7 (H) 04/25/2019   HGB 13.5 04/25/2019   HCT 41.2 04/25/2019   PLT 146 (L) 04/25/2019   GLUCOSE 142 (H) 11/21/2019   CHOL 149 10/16/2016   TRIG 101 10/16/2016   HDL 36 (L) 10/16/2016   LDLCALC 93 10/16/2016   ALT 307 (H) 11/25/2018   AST 70 (H) 11/25/2018   NA 143 11/21/2019   K 4.5 11/21/2019   CL 102 11/21/2019   CREATININE 1.44 (H) 11/21/2019   BUN 22 11/21/2019   CO2 22 11/21/2019   TSH 3.244 08/20/2014   INR 1.13 11/25/2018   HGBA1C 6.9 (H) 11/21/2018    Labs dated 07/28/17: BUN 29, creatinine 1.03. Other chemistries normal. A1c 6.2%. Cholesterol 137, Triglycerides 63, HDL 43, LDL 81, TSH normal. Labs dated 12/21/17: cholesterol 123, triglycerides 64, HDL 41, LDL 69. A1c 6.3%. CMET and CBC normal. TSH normal.  Echo: 05/06/17:  Study Conclusions  - Left ventricle: The cavity size was normal. Wall thickness was   increased in a pattern of mild LVH. Systolic function was normal.   The estimated ejection fraction was in the range of 50% to 55%.   Wall motion was normal; there were no regional wall motion   abnormalities. Doppler parameters are consistent with abnormal   left ventricular relaxation (grade 1 diastolic dysfunction). - Aortic  valve: There was mild stenosis. There was mild to moderate   regurgitation. - Mitral valve: Calcified annulus. - Left atrium: The atrium was mildly dilated. - Right ventricle: The cavity size was mildly dilated. - Right atrium: The atrium was mildly dilated. - Pulmonary arteries: Systolic pressure was moderately increased.  Impressions:  - Normal LV systolic function; mild LVH; mild diastolic   dysfunction; calcified aortic valve with mild AS (mean gradient   18 mmHg) and mild to moderate AI; mild LAE; mild RAE and RVE;   trace TR with moderately elevated pulmonary pressure.  Echo 11/21/18: Study Conclusions  - Left ventricle: The cavity size was normal. Systolic function was   mildly to moderately reduced. The estimated ejection fraction was   in the range of 40% to 45%. Diffuse hypokinesis. The study is not   technically sufficient to allow evaluation of LV diastolic   function. - Ventricular septum: The contour showed diastolic flattening. - Aortic valve: Valve mobility was restricted. There was mild   stenosis. There was moderate regurgitation. - Right ventricle: The cavity size was moderately dilated. Wall   thickness was normal. Systolic function was moderately reduced. - Right atrium: The atrium was severely dilated. - Tricuspid valve: There was moderate regurgitation. - Pulmonary arteries: Systolic pressure was moderately increased.   PA peak pressure: 53 mm Hg (S).  Impressions:  - Compared to the prior study, there has been no significant   interval change.   Assessment /  Plan: 1. Chronic systolic CHF.  He has a history of cardiomyopathy- nonischemic. EF normalized in past when compliant with medication. Most recent Echo showed drop in EF to 40-45% possibly related to sepsis and medication noncompliance.  Reinforced importance of compliance with medical therapy. Low sodium diet. Continue lasix, Coreg, Quinapril. Appears to be euvolemic. We will update Echo now.     2. HTN - BP is well controlled today.  3. PAD s/p redo right fem-pop bypass last June. S/p aortobifemoral BPG Feb. 2016. Recommend he arrange follow up with [facility].   4. Hyperlipidemia. on pravastatin. Enforce compliance. Will update fasting labs today.   5. Biscuspid AV with mild AS/moderate AI. Follow up Echo  6. Tobacco abuse- quit 2 years ago.   7. DM now on insulin per primary care.  8. Advanced emphysema. On home oxygen now. On Anoro Ellipta  9. Afib in setting of sepsis. In NSR now. Recommend Eliquis to reduce risk of embolic phenomena.   10. CKD stage 3a  Follow up in 6 months.

## 2019-11-30 ENCOUNTER — Other Ambulatory Visit: Payer: Self-pay

## 2019-11-30 ENCOUNTER — Ambulatory Visit: Payer: Medicare HMO | Admitting: Cardiology

## 2019-11-30 ENCOUNTER — Encounter: Payer: Self-pay | Admitting: Cardiology

## 2019-11-30 VITALS — BP 130/62 | HR 79 | Temp 97.2°F | Ht 71.0 in | Wt 158.0 lb

## 2019-11-30 DIAGNOSIS — I739 Peripheral vascular disease, unspecified: Secondary | ICD-10-CM

## 2019-11-30 DIAGNOSIS — I420 Dilated cardiomyopathy: Secondary | ICD-10-CM | POA: Diagnosis not present

## 2019-11-30 DIAGNOSIS — I10 Essential (primary) hypertension: Secondary | ICD-10-CM

## 2019-11-30 DIAGNOSIS — I5022 Chronic systolic (congestive) heart failure: Secondary | ICD-10-CM

## 2019-11-30 DIAGNOSIS — I351 Nonrheumatic aortic (valve) insufficiency: Secondary | ICD-10-CM

## 2019-11-30 LAB — BASIC METABOLIC PANEL
BUN/Creatinine Ratio: 13 (ref 10–24)
BUN: 21 mg/dL (ref 8–27)
CO2: 22 mmol/L (ref 20–29)
Calcium: 9.9 mg/dL (ref 8.6–10.2)
Chloride: 104 mmol/L (ref 96–106)
Creatinine, Ser: 1.57 mg/dL — ABNORMAL HIGH (ref 0.76–1.27)
GFR calc Af Amer: 52 mL/min/{1.73_m2} — ABNORMAL LOW (ref >59)
GFR calc non Af Amer: 45 mL/min/{1.73_m2} — ABNORMAL LOW (ref >59)
Glucose: 97 mg/dL (ref 65–99)
Potassium: 4.7 mmol/L (ref 3.5–5.2)
Sodium: 143 mmol/L (ref 134–144)

## 2019-11-30 LAB — HEPATIC FUNCTION PANEL
ALT: 11 IU/L (ref 0–44)
AST: 12 IU/L (ref 0–40)
Albumin: 4.5 g/dL (ref 3.8–4.8)
Alkaline Phosphatase: 104 IU/L (ref 39–117)
Bilirubin Total: 0.4 mg/dL (ref 0.0–1.2)
Bilirubin, Direct: 0.12 mg/dL (ref 0.00–0.40)
Total Protein: 8 g/dL (ref 6.0–8.5)

## 2019-11-30 LAB — LIPID PANEL
Chol/HDL Ratio: 4 ratio (ref 0.0–5.0)
Cholesterol, Total: 166 mg/dL (ref 100–199)
HDL: 41 mg/dL (ref >39)
LDL Chol Calc (NIH): 101 mg/dL — ABNORMAL HIGH (ref 0–99)
Triglycerides: 137 mg/dL (ref 0–149)
VLDL Cholesterol Cal: 24 mg/dL (ref 5–40)

## 2019-11-30 LAB — HEMOGLOBIN A1C
Est. average glucose Bld gHb Est-mCnc: 171 mg/dL
Hgb A1c MFr Bld: 7.6 % — ABNORMAL HIGH (ref 4.8–5.6)

## 2019-11-30 NOTE — Patient Instructions (Signed)
Call Dr Luther Parody office and make an appointment  Continue your current therapy  We will check blood work today  We will schedule you for an Echocardiogram

## 2019-12-01 ENCOUNTER — Other Ambulatory Visit: Payer: Self-pay

## 2019-12-01 DIAGNOSIS — E7800 Pure hypercholesterolemia, unspecified: Secondary | ICD-10-CM

## 2019-12-01 DIAGNOSIS — I10 Essential (primary) hypertension: Secondary | ICD-10-CM

## 2019-12-01 MED ORDER — ROSUVASTATIN CALCIUM 40 MG PO TABS: 40.0000 mg | ORAL_TABLET | Freq: Every day | ORAL | 3 refills | Status: DC

## 2019-12-04 ENCOUNTER — Telehealth: Payer: Self-pay | Admitting: Acute Care

## 2019-12-04 ENCOUNTER — Inpatient Hospital Stay: Admission: RE | Admit: 2019-12-04 | Payer: Medicare HMO | Source: Ambulatory Visit

## 2019-12-04 ENCOUNTER — Encounter: Payer: Medicare HMO | Admitting: Acute Care

## 2019-12-04 NOTE — Telephone Encounter (Signed)
Spoke with pt's wife and rescheduled [facility] 01/01/20 2:00 CT will be rescheduled Nothing further needed

## 2019-12-11 ENCOUNTER — Telehealth: Payer: Self-pay | Admitting: Cardiology

## 2019-12-11 NOTE — Telephone Encounter (Signed)
Patient's wife is calling requesting samples of an alternative to apixaban (ELIQUIS) 5 MG TABS tablet medication. Please call.

## 2019-12-11 NOTE — Telephone Encounter (Signed)
Spoke with pt wife, aware we currently have elqiuis samples and wioll place at the front desk for pickup.

## 2019-12-13 ENCOUNTER — Other Ambulatory Visit: Payer: Self-pay

## 2019-12-13 ENCOUNTER — Ambulatory Visit (HOSPITAL_COMMUNITY): Payer: Medicare HMO | Attending: Cardiology

## 2019-12-13 DIAGNOSIS — I739 Peripheral vascular disease, unspecified: Secondary | ICD-10-CM | POA: Insufficient documentation

## 2019-12-13 DIAGNOSIS — I10 Essential (primary) hypertension: Secondary | ICD-10-CM | POA: Diagnosis not present

## 2019-12-13 DIAGNOSIS — I5022 Chronic systolic (congestive) heart failure: Secondary | ICD-10-CM | POA: Diagnosis not present

## 2019-12-13 DIAGNOSIS — I420 Dilated cardiomyopathy: Secondary | ICD-10-CM | POA: Insufficient documentation

## 2019-12-13 DIAGNOSIS — I351 Nonrheumatic aortic (valve) insufficiency: Secondary | ICD-10-CM | POA: Insufficient documentation

## 2019-12-19 ENCOUNTER — Other Ambulatory Visit: Payer: Self-pay

## 2019-12-19 MED ORDER — APIXABAN 5 MG PO TABS: 5.0000 mg | ORAL_TABLET | Freq: Two times a day (BID) | ORAL | 3 refills | Status: DC

## 2019-12-21 ENCOUNTER — Telehealth: Payer: Self-pay

## 2019-12-21 NOTE — Telephone Encounter (Signed)
Spoke to patient's wife Eliquis 5 mg samples left at [employer] office front desk.

## 2019-12-22 ENCOUNTER — Telehealth: Payer: Self-pay | Admitting: Cardiology

## 2019-12-22 NOTE — Telephone Encounter (Signed)
I spoke with patient's wife. She reports he has been nauseated since Monday.  Occasional vomiting. She reports they are unable to see the instructions well on his medication bottles. She does not have the medication bottles with her. Crestor was started earlier this month and she thinks patient has been taking this twice daily.  I told her patient should only be taking once daily.  I advised her to have patient stop Crestor for now and I would forward to Dr Martinique for further instructions.

## 2019-12-22 NOTE — Telephone Encounter (Signed)
New Message     Pt c/o medication issue:  1. Name of Medication: rosuvastatin (CRESTOR) 40 MG tablet  2. How are you currently taking this medication (dosage and times per day)? 40 mg 2 x daily   3. Are you having a reaction (difficulty breathing--STAT)? No   4. What is your medication issue? Pts wife is calling and says the pt has been feeling sick. She wants tgo make sure he is taking it correctly. He is taking 2 pills daily   Please call

## 2019-12-24 ENCOUNTER — Inpatient Hospital Stay (HOSPITAL_COMMUNITY): Payer: Medicare HMO

## 2019-12-24 ENCOUNTER — Other Ambulatory Visit: Payer: Self-pay

## 2019-12-24 ENCOUNTER — Inpatient Hospital Stay (HOSPITAL_COMMUNITY)
Admission: EM | Admit: 2019-12-24 | Discharge: 2020-01-11 | DRG: 673 | Disposition: A | Discharge status: Home / Self Care | Payer: Medicare HMO | Attending: Internal Medicine | Admitting: Internal Medicine

## 2019-12-24 ENCOUNTER — Encounter (HOSPITAL_COMMUNITY): Payer: Self-pay | Admitting: Emergency Medicine

## 2019-12-24 ENCOUNTER — Emergency Department (HOSPITAL_COMMUNITY): Payer: Medicare HMO

## 2019-12-24 DIAGNOSIS — R911 Solitary pulmonary nodule: Secondary | ICD-10-CM | POA: Diagnosis present

## 2019-12-24 DIAGNOSIS — I480 Paroxysmal atrial fibrillation: Secondary | ICD-10-CM | POA: Diagnosis present

## 2019-12-24 DIAGNOSIS — I7409 Other arterial embolism and thrombosis of abdominal aorta: Secondary | ICD-10-CM | POA: Diagnosis not present

## 2019-12-24 DIAGNOSIS — G9341 Metabolic encephalopathy: Secondary | ICD-10-CM

## 2019-12-24 DIAGNOSIS — K5900 Constipation, unspecified: Secondary | ICD-10-CM | POA: Diagnosis not present

## 2019-12-24 DIAGNOSIS — I130 Hypertensive heart and chronic kidney disease with heart failure and stage 1 through stage 4 chronic kidney disease, or unspecified chronic kidney disease: Secondary | ICD-10-CM | POA: Diagnosis present

## 2019-12-24 DIAGNOSIS — E119 Type 2 diabetes mellitus without complications: Secondary | ICD-10-CM

## 2019-12-24 DIAGNOSIS — Z7901 Long term (current) use of anticoagulants: Secondary | ICD-10-CM

## 2019-12-24 DIAGNOSIS — J9600 Acute respiratory failure, unspecified whether with hypoxia or hypercapnia: Secondary | ICD-10-CM | POA: Diagnosis not present

## 2019-12-24 DIAGNOSIS — Z87891 Personal history of nicotine dependence: Secondary | ICD-10-CM | POA: Diagnosis not present

## 2019-12-24 DIAGNOSIS — G4089 Other seizures: Secondary | ICD-10-CM | POA: Diagnosis present

## 2019-12-24 DIAGNOSIS — G9340 Encephalopathy, unspecified: Secondary | ICD-10-CM | POA: Diagnosis not present

## 2019-12-24 DIAGNOSIS — E785 Hyperlipidemia, unspecified: Secondary | ICD-10-CM | POA: Diagnosis not present

## 2019-12-24 DIAGNOSIS — R4182 Altered mental status, unspecified: Secondary | ICD-10-CM | POA: Diagnosis not present

## 2019-12-24 DIAGNOSIS — I420 Dilated cardiomyopathy: Secondary | ICD-10-CM | POA: Diagnosis not present

## 2019-12-24 DIAGNOSIS — M109 Gout, unspecified: Secondary | ICD-10-CM | POA: Diagnosis present

## 2019-12-24 DIAGNOSIS — I10 Essential (primary) hypertension: Secondary | ICD-10-CM | POA: Diagnosis present

## 2019-12-24 DIAGNOSIS — J9621 Acute and chronic respiratory failure with hypoxia: Secondary | ICD-10-CM | POA: Diagnosis not present

## 2019-12-24 DIAGNOSIS — N1831 Chronic kidney disease, stage 3a: Secondary | ICD-10-CM | POA: Diagnosis present

## 2019-12-24 DIAGNOSIS — G9349 Other encephalopathy: Secondary | ICD-10-CM | POA: Diagnosis present

## 2019-12-24 DIAGNOSIS — R001 Bradycardia, unspecified: Secondary | ICD-10-CM | POA: Diagnosis not present

## 2019-12-24 DIAGNOSIS — J9601 Acute respiratory failure with hypoxia: Secondary | ICD-10-CM | POA: Diagnosis not present

## 2019-12-24 DIAGNOSIS — Z833 Family history of diabetes mellitus: Secondary | ICD-10-CM

## 2019-12-24 DIAGNOSIS — I482 Chronic atrial fibrillation, unspecified: Secondary | ICD-10-CM | POA: Diagnosis not present

## 2019-12-24 DIAGNOSIS — E875 Hyperkalemia: Secondary | ICD-10-CM | POA: Diagnosis present

## 2019-12-24 DIAGNOSIS — D631 Anemia in chronic kidney disease: Secondary | ICD-10-CM | POA: Diagnosis present

## 2019-12-24 DIAGNOSIS — D72829 Elevated white blood cell count, unspecified: Secondary | ICD-10-CM | POA: Diagnosis not present

## 2019-12-24 DIAGNOSIS — Z20822 Contact with and (suspected) exposure to covid-19: Secondary | ICD-10-CM | POA: Diagnosis present

## 2019-12-24 DIAGNOSIS — Z7951 Long term (current) use of inhaled steroids: Secondary | ICD-10-CM

## 2019-12-24 DIAGNOSIS — E1165 Type 2 diabetes mellitus with hyperglycemia: Secondary | ICD-10-CM | POA: Diagnosis not present

## 2019-12-24 DIAGNOSIS — E872 Acidosis, unspecified: Secondary | ICD-10-CM

## 2019-12-24 DIAGNOSIS — E46 Unspecified protein-calorie malnutrition: Secondary | ICD-10-CM | POA: Diagnosis present

## 2019-12-24 DIAGNOSIS — Z9981 Dependence on supplemental oxygen: Secondary | ICD-10-CM

## 2019-12-24 DIAGNOSIS — Z01818 Encounter for other preprocedural examination: Secondary | ICD-10-CM

## 2019-12-24 DIAGNOSIS — N178 Other acute kidney failure: Secondary | ICD-10-CM

## 2019-12-24 DIAGNOSIS — J9611 Chronic respiratory failure with hypoxia: Secondary | ICD-10-CM | POA: Diagnosis not present

## 2019-12-24 DIAGNOSIS — E1151 Type 2 diabetes mellitus with diabetic peripheral angiopathy without gangrene: Secondary | ICD-10-CM | POA: Diagnosis present

## 2019-12-24 DIAGNOSIS — E1159 Type 2 diabetes mellitus with other circulatory complications: Secondary | ICD-10-CM | POA: Diagnosis present

## 2019-12-24 DIAGNOSIS — E11649 Type 2 diabetes mellitus with hypoglycemia without coma: Secondary | ICD-10-CM | POA: Diagnosis present

## 2019-12-24 DIAGNOSIS — Z8249 Family history of ischemic heart disease and other diseases of the circulatory system: Secondary | ICD-10-CM

## 2019-12-24 DIAGNOSIS — Z8719 Personal history of other diseases of the digestive system: Secondary | ICD-10-CM

## 2019-12-24 DIAGNOSIS — K559 Vascular disorder of intestine, unspecified: Secondary | ICD-10-CM | POA: Diagnosis not present

## 2019-12-24 DIAGNOSIS — E860 Dehydration: Secondary | ICD-10-CM | POA: Diagnosis present

## 2019-12-24 DIAGNOSIS — J449 Chronic obstructive pulmonary disease, unspecified: Secondary | ICD-10-CM | POA: Diagnosis not present

## 2019-12-24 DIAGNOSIS — N179 Acute kidney failure, unspecified: Principal | ICD-10-CM

## 2019-12-24 DIAGNOSIS — N19 Unspecified kidney failure: Secondary | ICD-10-CM | POA: Diagnosis present

## 2019-12-24 DIAGNOSIS — Z452 Encounter for adjustment and management of vascular access device: Secondary | ICD-10-CM

## 2019-12-24 DIAGNOSIS — E1122 Type 2 diabetes mellitus with diabetic chronic kidney disease: Secondary | ICD-10-CM | POA: Diagnosis present

## 2019-12-24 DIAGNOSIS — Z79899 Other long term (current) drug therapy: Secondary | ICD-10-CM

## 2019-12-24 DIAGNOSIS — Z794 Long term (current) use of insulin: Secondary | ICD-10-CM | POA: Diagnosis not present

## 2019-12-24 DIAGNOSIS — N170 Acute kidney failure with tubular necrosis: Secondary | ICD-10-CM | POA: Diagnosis not present

## 2019-12-24 DIAGNOSIS — R54 Age-related physical debility: Secondary | ICD-10-CM | POA: Diagnosis present

## 2019-12-24 DIAGNOSIS — Z8349 Family history of other endocrine, nutritional and metabolic diseases: Secondary | ICD-10-CM

## 2019-12-24 DIAGNOSIS — M25422 Effusion, left elbow: Secondary | ICD-10-CM

## 2019-12-24 DIAGNOSIS — Z8673 Personal history of transient ischemic attack (TIA), and cerebral infarction without residual deficits: Secondary | ICD-10-CM

## 2019-12-24 DIAGNOSIS — Z95820 Peripheral vascular angioplasty status with implants and grafts: Secondary | ICD-10-CM

## 2019-12-24 DIAGNOSIS — Q231 Congenital insufficiency of aortic valve: Secondary | ICD-10-CM | POA: Diagnosis not present

## 2019-12-24 DIAGNOSIS — I5022 Chronic systolic (congestive) heart failure: Secondary | ICD-10-CM | POA: Diagnosis not present

## 2019-12-24 DIAGNOSIS — I272 Pulmonary hypertension, unspecified: Secondary | ICD-10-CM | POA: Diagnosis present

## 2019-12-24 DIAGNOSIS — D509 Iron deficiency anemia, unspecified: Secondary | ICD-10-CM | POA: Diagnosis present

## 2019-12-24 DIAGNOSIS — E8729 Other acidosis: Secondary | ICD-10-CM | POA: Diagnosis present

## 2019-12-24 LAB — BASIC METABOLIC PANEL
Anion gap: 35 — ABNORMAL HIGH (ref 5–15)
Anion gap: 31 — ABNORMAL HIGH (ref 5–15)
Anion gap: 33 — ABNORMAL HIGH (ref 5–15)
BUN: 147 mg/dL — ABNORMAL HIGH (ref 8–23)
BUN: 149 mg/dL — ABNORMAL HIGH (ref 8–23)
BUN: 149 mg/dL — ABNORMAL HIGH (ref 8–23)
CO2: 8 mmol/L — ABNORMAL LOW (ref 22–32)
CO2: 10 mmol/L — ABNORMAL LOW (ref 22–32)
CO2: 13 mmol/L — ABNORMAL LOW (ref 22–32)
Calcium: 8.4 mg/dL — ABNORMAL LOW (ref 8.9–10.3)
Calcium: 8.4 mg/dL — ABNORMAL LOW (ref 8.9–10.3)
Calcium: 7.8 mg/dL — ABNORMAL LOW (ref 8.9–10.3)
Chloride: 98 mmol/L (ref 98–111)
Chloride: 101 mmol/L (ref 98–111)
Chloride: 95 mmol/L — ABNORMAL LOW (ref 98–111)
Creatinine, Ser: 14.45 mg/dL — ABNORMAL HIGH (ref 0.61–1.24)
Creatinine, Ser: 14.23 mg/dL — ABNORMAL HIGH (ref 0.61–1.24)
Creatinine, Ser: 13.31 mg/dL — ABNORMAL HIGH (ref 0.61–1.24)
GFR calc Af Amer: 4 mL/min — ABNORMAL LOW (ref >60)
GFR calc Af Amer: 4 mL/min — ABNORMAL LOW (ref >60)
GFR calc Af Amer: 4 mL/min — ABNORMAL LOW (ref >60)
GFR calc non Af Amer: 3 mL/min — ABNORMAL LOW (ref >60)
GFR calc non Af Amer: 3 mL/min — ABNORMAL LOW (ref >60)
GFR calc non Af Amer: 3 mL/min — ABNORMAL LOW (ref >60)
Glucose, Bld: 254 mg/dL — ABNORMAL HIGH (ref 70–99)
Glucose, Bld: 112 mg/dL — ABNORMAL HIGH (ref 70–99)
Glucose, Bld: 93 mg/dL (ref 70–99)
Potassium: 6.3 mmol/L (ref 3.5–5.1)
Potassium: 6.4 mmol/L (ref 3.5–5.1)
Potassium: 6.7 mmol/L (ref 3.5–5.1)
Sodium: 141 mmol/L (ref 135–145)
Sodium: 142 mmol/L (ref 135–145)
Sodium: 141 mmol/L (ref 135–145)

## 2019-12-24 LAB — URINALYSIS, ROUTINE W REFLEX MICROSCOPIC
Bacteria, UA: NONE SEEN
Bilirubin Urine: NEGATIVE
Glucose, UA: 50 mg/dL — AB
Ketones, ur: 20 mg/dL — AB
Leukocytes,Ua: NEGATIVE
Nitrite: NEGATIVE
Protein, ur: 100 mg/dL — AB
Specific Gravity, Urine: 1.014 (ref 1.005–1.030)
pH: 5 (ref 5.0–8.0)

## 2019-12-24 LAB — POCT I-STAT 7, (LYTES, BLD GAS, ICA,H+H)
Acid-base deficit: 12 mmol/L — ABNORMAL HIGH (ref 0.0–2.0)
Bicarbonate: 12.9 mmol/L — ABNORMAL LOW (ref 20.0–28.0)
Calcium, Ion: 0.95 mmol/L — ABNORMAL LOW (ref 1.15–1.40)
HCT: 37 % — ABNORMAL LOW (ref 39.0–52.0)
Hemoglobin: 12.6 g/dL — ABNORMAL LOW (ref 13.0–17.0)
O2 Saturation: 91 %
Patient temperature: 97.4
Potassium: 6.4 mmol/L (ref 3.5–5.1)
Sodium: 138 mmol/L (ref 135–145)
TCO2: 14 mmol/L — ABNORMAL LOW (ref 22–32)
pCO2 arterial: 24.4 mmHg — ABNORMAL LOW (ref 32.0–48.0)
pH, Arterial: 7.328 — ABNORMAL LOW (ref 7.350–7.450)
pO2, Arterial: 62 mmHg — ABNORMAL LOW (ref 83.0–108.0)

## 2019-12-24 LAB — COMPREHENSIVE METABOLIC PANEL
ALT: 28 U/L (ref 0–44)
AST: 36 U/L (ref 15–41)
Albumin: 3 g/dL — ABNORMAL LOW (ref 3.5–5.0)
Alkaline Phosphatase: 65 U/L (ref 38–126)
Anion gap: 38 — ABNORMAL HIGH (ref 5–15)
BUN: 143 mg/dL — ABNORMAL HIGH (ref 8–23)
CO2: 8 mmol/L — ABNORMAL LOW (ref 22–32)
Calcium: 9.3 mg/dL (ref 8.9–10.3)
Chloride: 97 mmol/L — ABNORMAL LOW (ref 98–111)
Creatinine, Ser: 14.97 mg/dL — ABNORMAL HIGH (ref 0.61–1.24)
GFR calc Af Amer: 3 mL/min — ABNORMAL LOW (ref >60)
GFR calc non Af Amer: 3 mL/min — ABNORMAL LOW (ref >60)
Glucose, Bld: 45 mg/dL — ABNORMAL LOW (ref 70–99)
Potassium: 6.8 mmol/L (ref 3.5–5.1)
Sodium: 143 mmol/L (ref 135–145)
Total Bilirubin: 2.5 mg/dL — ABNORMAL HIGH (ref 0.3–1.2)
Total Protein: 7.7 g/dL (ref 6.5–8.1)

## 2019-12-24 LAB — CBG MONITORING, ED
Glucose-Capillary: 120 mg/dL — ABNORMAL HIGH (ref 70–99)
Glucose-Capillary: 152 mg/dL — ABNORMAL HIGH (ref 70–99)
Glucose-Capillary: 246 mg/dL — ABNORMAL HIGH (ref 70–99)
Glucose-Capillary: 265 mg/dL — ABNORMAL HIGH (ref 70–99)
Glucose-Capillary: 205 mg/dL — ABNORMAL HIGH (ref 70–99)

## 2019-12-24 LAB — NA AND K (SODIUM & POTASSIUM), RAND UR
Potassium Urine: 16 mmol/L
Sodium, Ur: 101 mmol/L

## 2019-12-24 LAB — GLUCOSE, CAPILLARY
Glucose-Capillary: 201 mg/dL — ABNORMAL HIGH (ref 70–99)
Glucose-Capillary: 19 mg/dL — CL (ref 70–99)
Glucose-Capillary: 139 mg/dL — ABNORMAL HIGH (ref 70–99)
Glucose-Capillary: 197 mg/dL — ABNORMAL HIGH (ref 70–99)
Glucose-Capillary: 86 mg/dL (ref 70–99)
Glucose-Capillary: 90 mg/dL (ref 70–99)
Glucose-Capillary: 162 mg/dL — ABNORMAL HIGH (ref 70–99)

## 2019-12-24 LAB — CBC
HCT: 46.8 % (ref 39.0–52.0)
Hemoglobin: 15.1 g/dL (ref 13.0–17.0)
MCH: 29.3 pg (ref 26.0–34.0)
MCHC: 32.3 g/dL (ref 30.0–36.0)
MCV: 90.9 fL (ref 80.0–100.0)
Platelets: 363 10*3/uL (ref 150–400)
RBC: 5.15 MIL/uL (ref 4.22–5.81)
RDW: 15.5 % (ref 11.5–15.5)
WBC: 8.8 10*3/uL (ref 4.0–10.5)
nRBC: 0 % (ref 0.0–0.2)

## 2019-12-24 LAB — LIPASE, BLOOD: Lipase: 71 U/L — ABNORMAL HIGH (ref 11–51)

## 2019-12-24 LAB — CK: Total CK: 321 U/L (ref 49–397)

## 2019-12-24 LAB — SODIUM, URINE, RANDOM: Sodium, Ur: 70 mmol/L

## 2019-12-24 LAB — LACTIC ACID, PLASMA
Lactic Acid, Venous: 10.2 mmol/L (ref 0.5–1.9)
Lactic Acid, Venous: 9.3 mmol/L (ref 0.5–1.9)

## 2019-12-24 LAB — HEMOGLOBIN A1C
Hgb A1c MFr Bld: 8 % — ABNORMAL HIGH (ref 4.8–5.6)
Mean Plasma Glucose: 182.9 mg/dL

## 2019-12-24 LAB — SARS CORONAVIRUS 2 (TAT 6-24 HRS): SARS Coronavirus 2: NEGATIVE

## 2019-12-24 LAB — HIV ANTIBODY (ROUTINE TESTING W REFLEX): HIV Screen 4th Generation wRfx: NONREACTIVE

## 2019-12-24 LAB — CREATININE, URINE, RANDOM: Creatinine, Urine: 61.39 mg/dL

## 2019-12-24 LAB — MRSA PCR SCREENING: MRSA by PCR: NEGATIVE

## 2019-12-24 MED ORDER — SODIUM ZIRCONIUM CYCLOSILICATE 5 G PO PACK
10.0000 g | PACK | Freq: Three times a day (TID) | ORAL | Status: AC
  Administered 2019-12-24 (×2): 10 g via ORAL
  Filled 2019-12-24: qty 2
  Filled 2019-12-24: qty 1

## 2019-12-24 MED ORDER — CHLORHEXIDINE GLUCONATE CLOTH 2 % EX PADS
6.0000 | MEDICATED_PAD | Freq: Every day | CUTANEOUS | Status: DC
  Administered 2019-12-24 – 2020-01-11 (×18): 6 via TOPICAL

## 2019-12-24 MED ORDER — ORAL CARE MOUTH RINSE
15.0000 mL | Freq: Two times a day (BID) | OROMUCOSAL | Status: DC
  Administered 2019-12-24 – 2019-12-25 (×2): 15 mL via OROMUCOSAL

## 2019-12-24 MED ORDER — HEPARIN SODIUM (PORCINE) 5000 UNIT/ML IJ SOLN
5000.0000 [IU] | Freq: Three times a day (TID) | INTRAMUSCULAR | Status: DC
  Administered 2019-12-24 – 2019-12-27 (×6): 5000 [IU] via SUBCUTANEOUS
  Filled 2019-12-24 (×6): qty 1

## 2019-12-24 MED ORDER — ONDANSETRON HCL 4 MG/2ML IJ SOLN
4.0000 mg | Freq: Four times a day (QID) | INTRAMUSCULAR | Status: DC | PRN
  Administered 2019-12-24: 4 mg via INTRAVENOUS
  Filled 2019-12-24: qty 2

## 2019-12-24 MED ORDER — STERILE WATER FOR INJECTION IV SOLN
INTRAVENOUS | Status: DC
  Filled 2019-12-24 (×9): qty 850

## 2019-12-24 MED ORDER — HEPARIN SODIUM (PORCINE) 1000 UNIT/ML DIALYSIS: 2000.0000 [IU] | INTRAMUSCULAR | Status: DC | PRN

## 2019-12-24 MED ORDER — DEXTROSE 50 % IV SOLN
INTRAVENOUS | Status: AC
  Filled 2019-12-24: qty 50

## 2019-12-24 MED ORDER — DEXTROSE 50 % IV SOLN
1.0000 | Freq: Once | INTRAVENOUS | Status: AC
  Administered 2019-12-24: 50 mL via INTRAVENOUS
  Filled 2019-12-24: qty 50

## 2019-12-24 MED ORDER — SODIUM BICARBONATE 8.4 % IV SOLN
50.0000 meq | Freq: Once | INTRAVENOUS | Status: AC
  Administered 2019-12-24: 50 meq via INTRAVENOUS
  Filled 2019-12-24: qty 50

## 2019-12-24 MED ORDER — SODIUM CHLORIDE 0.9% FLUSH
3.0000 mL | Freq: Once | INTRAVENOUS | Status: AC
  Administered 2019-12-24: 3 mL via INTRAVENOUS

## 2019-12-24 MED ORDER — SODIUM CHLORIDE 0.9 % IV BOLUS
1000.0000 mL | Freq: Once | INTRAVENOUS | Status: AC
  Administered 2019-12-24: 1000 mL via INTRAVENOUS

## 2019-12-24 MED ORDER — INSULIN ASPART 100 UNIT/ML ~~LOC~~ SOLN: 0.0000 [IU] | Freq: Every day | SUBCUTANEOUS | Status: DC

## 2019-12-24 MED ORDER — SODIUM BICARBONATE 8.4 % IV SOLN
INTRAVENOUS | Status: DC
  Filled 2019-12-24 (×3): qty 150

## 2019-12-24 MED ORDER — SODIUM CHLORIDE 0.45 % IV SOLN: INTRAVENOUS | Status: DC

## 2019-12-24 MED ORDER — HEPARIN SODIUM (PORCINE) 1000 UNIT/ML IJ SOLN
INTRAMUSCULAR | Status: AC
  Filled 2019-12-24: qty 4

## 2019-12-24 MED ORDER — ALUM & MAG HYDROXIDE-SIMETH 200-200-20 MG/5ML PO SUSP
30.0000 mL | Freq: Once | ORAL | Status: AC
  Administered 2019-12-24: 30 mL via ORAL
  Filled 2019-12-24: qty 30

## 2019-12-24 MED ORDER — ACETAMINOPHEN 325 MG PO TABS
650.0000 mg | ORAL_TABLET | ORAL | Status: DC | PRN
  Administered 2020-01-03 (×3): 650 mg via ORAL
  Filled 2019-12-24 (×3): qty 2

## 2019-12-24 MED ORDER — ALBUTEROL SULFATE (2.5 MG/3ML) 0.083% IN NEBU
10.0000 mg | INHALATION_SOLUTION | Freq: Once | RESPIRATORY_TRACT | Status: DC
  Filled 2019-12-24: qty 12

## 2019-12-24 MED ORDER — HEPARIN SODIUM (PORCINE) 1000 UNIT/ML IJ SOLN
1000.0000 [IU] | Freq: Once | INTRAMUSCULAR | Status: AC
  Administered 2019-12-24: 2400 [IU] via INTRAVENOUS
  Filled 2019-12-24: qty 6

## 2019-12-24 MED ORDER — DEXTROSE 50 % IV SOLN: 25.0000 g | Freq: Once | INTRAVENOUS | Status: AC

## 2019-12-24 MED ORDER — HYOSCYAMINE SULFATE 0.125 MG SL SUBL
0.2500 mg | SUBLINGUAL_TABLET | Freq: Once | SUBLINGUAL | Status: AC
  Administered 2019-12-24: 0.25 mg via SUBLINGUAL
  Filled 2019-12-24: qty 2

## 2019-12-24 MED ORDER — SODIUM BICARBONATE-DEXTROSE 150-5 MEQ/L-% IV SOLN
150.0000 meq | Freq: Once | INTRAVENOUS | Status: DC
  Filled 2019-12-24: qty 1000

## 2019-12-24 MED ORDER — GUAIFENESIN ER 600 MG PO TB12
600.0000 mg | ORAL_TABLET | Freq: Two times a day (BID) | ORAL | Status: DC
  Administered 2019-12-24 – 2019-12-26 (×2): 600 mg via ORAL
  Filled 2019-12-24 (×5): qty 1

## 2019-12-24 MED ORDER — INSULIN ASPART 100 UNIT/ML IV SOLN
5.0000 [IU] | Freq: Once | INTRAVENOUS | Status: AC
  Administered 2019-12-24: 5 [IU] via INTRAVENOUS

## 2019-12-24 MED ORDER — INSULIN ASPART 100 UNIT/ML ~~LOC~~ SOLN
0.0000 [IU] | Freq: Three times a day (TID) | SUBCUTANEOUS | Status: DC
  Administered 2019-12-24: 5 [IU] via SUBCUTANEOUS

## 2019-12-24 MED ORDER — DEXTROSE 50 % IV SOLN
INTRAVENOUS | Status: AC
  Administered 2019-12-24: 25 g via INTRAVENOUS
  Filled 2019-12-24: qty 50

## 2019-12-24 MED ORDER — SODIUM BICARBONATE 8.4 % IV SOLN
Freq: Once | INTRAVENOUS | Status: DC
  Filled 2019-12-24: qty 150

## 2019-12-24 MED ORDER — CALCIUM GLUCONATE-NACL 2-0.675 GM/100ML-% IV SOLN
2.0000 g | Freq: Once | INTRAVENOUS | Status: AC
  Administered 2019-12-24: 2000 mg via INTRAVENOUS
  Filled 2019-12-24: qty 100

## 2019-12-24 MED ORDER — CHLORHEXIDINE GLUCONATE CLOTH 2 % EX PADS
6.0000 | MEDICATED_PAD | Freq: Every day | CUTANEOUS | Status: DC
  Administered 2019-12-25: 6 via TOPICAL

## 2019-12-24 MED ORDER — UMECLIDINIUM-VILANTEROL 62.5-25 MCG/INH IN AEPB
1.0000 | INHALATION_SPRAY | Freq: Every day | RESPIRATORY_TRACT | Status: DC
  Filled 2019-12-24: qty 14

## 2019-12-24 NOTE — Progress Notes (Signed)
CRITICAL VALUE ALERT  Critical Value:  Potassium 6.4  Date & Time Notied:  12/24/19 @ [id number]  Provider Notified:[place], MD

## 2019-12-24 NOTE — Progress Notes (Addendum)
This rn arrived to pt room for complete assessment when pt wife called for pt update. Upon return to pt bedside@2030 , pt more somnolent than at [place], pt mute to nursing questions, only answerd "yes" when asked if he has diabetes. Pt with blank stare, follows commands to reposition self from right side to supine and open mouth to show teeth. Pt with no grips or BUE/BLE movements to command. Questionable facial droop noted, PERRLA. Elink notified, camera visual done by nurse Elzie Rings. This rn and Elzie Rings note pt now fully nonverbal and BUE flacid. Pt cbg 90. Elzie Rings will inform elink md of events.

## 2019-12-24 NOTE — Telephone Encounter (Signed)
The only medication change we made was to switch pravastatin to Crestor. He presented to ED with Acute renal failure which is not related to this change. Being evaluated by Renal service.   Sammie Denner Martinique MD, [facility]

## 2019-12-24 NOTE — Procedures (Signed)
Hemodialysis Catheter Insertion Procedure Note Kyle [name] [id number] [birth year]  Procedure: Insertion of Hemodialysis Catheter Indications: Dialysis Access   Procedure Details Consent: Risks of procedure as well as the alternatives and risks of each were explained to the (patient/caregiver).  Consent for procedure obtained. Time Out: Verified patient identification, verified procedure, site/side was marked, verified correct patient position, special equipment/implants available, medications/allergies/relevent history reviewed, required imaging and test results available.  Performed  Maximum sterile technique was used including antiseptics, cap, gloves, gown, hand hygiene, mask and sheet. Skin prep: Chlorhexidine; local anesthetic administered Triple lumen hemodialysis catheter was inserted into right internal jugular vein using the Seldinger technique.  Evaluation Blood flow good Complications: No apparent complications Patient did tolerate procedure well. Chest X-ray ordered to verify placement.  CXR: pending.  Johnsie Cancel, NP-C [facility] Contact / Pager information can be found on Amion  12/24/2019, 6:11 PM

## 2019-12-24 NOTE — H&P (Addendum)
NAME:  Kyle [name], MRN:  [id number], DOB:  [birth year], LOS: 0 ADMISSION DATE:  12/24/2019, CONSULTATION DATE:  12/24/19 REFERRING MD:  ER, CHIEF COMPLAINT:  Abd pain   Brief History   69 year old man with a history of heavy smoking, COPD, peripheral vascular disease presenting with nausea, intermittent vomiting, and diarrhea for 1 week found to have acute renal failure and severe lactic acidosis of unclear etiology.  History of present illness   69 year old man with a history of heavy smoking, COPD, IDDM, peripheral vascular disease presenting with nausea, intermittent vomiting, and diarrhea for 1 week found to have acute renal failure and severe lactic acidosis of unclear etiology.  He denies any sick contacts.  Has a chronic dry cough.  He has been using his Anoro.  Has been using double his prescribed amount of rosuvastatin.  Noted to have severe hyperkalemia, metabolic acidosis, lactic acidosis.  His belly pain is cramping, mid epigastric in origin, and radiates to the chest.  Past Medical History  COPD on home oxygen Severe peripheral vascular disease History of heavy smoking Insulin-dependent diabetes  Significant Hospital Events   2/28 Admitted  Consults:  Nephrology, Hospitalist, PCCM  Procedures:  N/A  Significant Diagnostic Tests:  COVID>> CT Renal IMPRESSION: 1. No acute findings in the abdomen or pelvis. 2. Minimal left lower lobe collapse/consolidation. 3. 5 mm right lower lobe pulmonary nodule. No follow-up needed if patient is low-risk. Non-contrast chest CT can be considered in 12 months if patient is high-risk. This recommendation follows the consensus statement: Guidelines for Management of Incidental Pulmonary Nodules Detected on CT Images: From the Fleischner Society 2017; Radiology 2017; 284:228-243. 4.  Aortic Atherosclerois (ICD10-170.0)  Micro Data:  Blood culture x 2>> COVID >>  Antimicrobials:  N/A   Interim history/subjective:    Consulted.  Objective   Blood pressure (!) 137/55, pulse 70, resp. rate (!) 23, height 5\' 11"  (1.803 m), weight 80 kg, SpO2 91 %.       No intake or output data in the 24 hours ending 12/24/19 0748 Filed Weights   12/24/19 0404  Weight: 80 kg    Examination: GEN: thin man in NAD HEENT: MM dry, trachea midline CV: RRR, ext warm PULM: Diminished bilaterally, no wheezing, tachypneic GI: Soft, mild TTP in epigastrum EXT: Muscle wasting without edema, chronic PAD changes NEURO: Moves all 4 ext to command PSYCH: RASS 0 SKIN: no rashes   Resolved Hospital Problem list   N/A  Assessment & Plan:  (1) Acute renal failure, hyperkalemia- no obstruction on CT, poor PO plus underlying DM/HTN likely culprit - Hold home ACE-I - Calcium gluconate, lokelma, insulin/d50 - Bicarb drip - Nephrology consult, high chance will need HD - Strict I/O, foley  (2) Severe lactic acidosis- hx PAD raises question of ischemic colitis, CT abd and physical exam argue against this - Recheck, hydrate, trend, serial abd exam, terrible surgical candidate  (3) O2 dependent COPD not in flare - Continue home O2 and anoro  (4) Unintentional statin overdose- mild, transaminases and CPK okay - Watch LFTs  (5) IDDM, hypoglycemic on admission - SSI for now, can add back lantus at later time  (6) Lung nodule: 6 mo f/u scan  Best practice:  Diet: trial of clears Pain/Anxiety/Delirium protocol (if indicated): N/A VAP protocol (if indicated): N/A DVT prophylaxis: PTA eliquis on hold GI prophylaxis: N/A Glucose control: SSI Mobility: BR Code Status: Full, confirmed with patient Family Communication: Updated patient Disposition: Watch in ICU given tachypnea, frail baseline  respiratory status, severe unexplained lactic acidosis.  If he bounces back and likely go to floor in the morning.  Labs   CBC: Recent Labs  Lab 12/24/19 0419  WBC 8.8  HGB 15.1  HCT 46.8  MCV 90.9  PLT [id number]    Basic Metabolic  Panel: Recent Labs  Lab 12/24/19 0419  NA 143  K 6.8*  CL 97*  CO2 8*  GLUCOSE 45*  BUN 143*  CREATININE 14.97*  CALCIUM 9.3   GFR: Estimated Creatinine Clearance: 5.1 mL/min (A) (by C-G formula based on SCr of 14.97 mg/dL (H)). Recent Labs  Lab 12/24/19 0419 12/24/19 0600  WBC 8.8  --   LATICACIDVEN  --  10.2*    Liver Function Tests: Recent Labs  Lab 12/24/19 0419  AST 36  ALT 28  ALKPHOS 65  BILITOT 2.5*  PROT 7.7  ALBUMIN 3.0*   Recent Labs  Lab 12/24/19 0419  LIPASE 71*   No results for input(s): AMMONIA in the last 168 hours.  ABG    Component Value Date/Time   PHART 7.355 11/21/2018 0246   PCO2ART 38.6 11/21/2018 0246   PO2ART 55.3 (L) 11/21/2018 0246   HCO3 21.0 11/21/2018 0246   TCO2 25 04/21/2019 0631   ACIDBASEDEF 3.6 (H) 11/21/2018 0246   O2SAT 81.9 11/21/2018 0246     Coagulation Profile: No results for input(s): INR, PROTIME in the last 168 hours.  Cardiac Enzymes: No results for input(s): CKTOTAL, CKMB, CKMBINDEX, TROPONINI in the last 168 hours.  HbA1C: Hgb A1c MFr Bld  Date/Time Value Ref Range Status  11/30/2019 09:37 AM 7.6 (H) 4.8 - 5.6 % Final    Comment:             Prediabetes: 5.7 - 6.4          Diabetes: >6.4          Glycemic control for adults with diabetes: <7.0   11/21/2018 11:44 AM 6.9 (H) 4.8 - 5.6 % Final    Comment:    (NOTE) Pre diabetes:          5.7%-6.4% Diabetes:              >6.4% Glycemic control for   <7.0% adults with diabetes     CBG: Recent Labs  Lab 12/24/19 0608 12/24/19 0644  GLUCAP 120* 152*    Review of Systems:    Positive Symptoms in bold:  Constitutional fevers, chills, weight loss, fatigue, anorexia, malaise  Eyes decreased vision, double vision, eye irritation  Ears, Nose, Mouth, Throat sore throat, trouble swallowing, sinus congestion  Cardiovascular chest pain, paroxysmal nocturnal dyspnea, lower ext edema, palpitations   Respiratory SOB, cough, DOE, hemoptysis,  wheezing  Gastrointestinal nausea, vomiting, diarrhea  Genitourinary burning with urination, trouble urinating  Musculoskeletal joint aches, joint swelling, back pain  Integumentary  rashes, skin lesions  Neurological focal weakness, focal numbness, trouble speaking, headaches  Psychiatric depression, anxiety, confusion  Endocrine polyuria, polydipsia, cold intolerance, heat intolerance  Hematologic abnormal bruising, abnormal bleeding, unexplained nose bleeds  Allergic/Immunologic recurrent infections, hives, swollen lymph nodes     Past Medical History  He,  has a past medical history of Aortic insufficiency, Arterial occlusive disease, CHF (congestive heart failure) ([place]), COPD (chronic obstructive pulmonary disease) ([place]), Dilated cardiomyopathy ([place]), Edema, Hypertension, Normal coronary arteries (2009), Orthopnea, Peripheral arterial disease ([place]), Pulmonary hypertension ([place]), and SOB (shortness of breath).   Surgical History    Past Surgical History:  Procedure Laterality Date  . ABDOMINAL AORTAGRAM  N/A 11/05/2014   Procedure: ABDOMINAL Maxcine Ham;  Surgeon: Angelia Mould, MD;  Location: [facility] CATH LAB;  Service: Cardiovascular;  Laterality: N/A;  . ABDOMINAL AORTOGRAM W/LOWER EXTREMITY Bilateral 04/21/2019   Procedure: ABDOMINAL AORTOGRAM W/LOWER EXTREMITY;  Surgeon: Angelia Mould, MD;  Location: [place] CV LAB;  Service: Cardiovascular;  Laterality: Bilateral;  . AORTA - BILATERAL FEMORAL ARTERY BYPASS GRAFT N/A 12/18/2014   Procedure: AORTOBIFEMORAL BYPASS GRAFT;  Surgeon: Angelia Mould, MD;  Location: [place];  Service: Vascular;  Laterality: N/A;  . CARDIAC CATHETERIZATION  2009   Nl Cors, EF 20%  . COLONOSCOPY    . ENDARTERECTOMY FEMORAL Left 12/18/2014   Procedure: ENDARTERECTOMY FEMORAL;  Surgeon: Angelia Mould, MD;  Location: [place];  Service: Vascular;  Laterality: Left;  . FALSE ANEURYSM REPAIR Right 04/24/2019   Procedure: REPAIR OF RIGHT  FEMORAL ARTERY PSEUDOANEURYSM;  Surgeon: Rosetta Posner, MD;  Location: [place];  Service: Vascular;  Laterality: Right;  . FEMORAL-POPLITEAL BYPASS GRAFT  02/21/2011   right  . FEMORAL-TIBIAL BYPASS GRAFT Right 04/24/2019   Procedure: REDO BYPASS GRAFT RIGHT FEMORAL-TIBIAL ARTERY USING LEFT LEG VEIN & HEMASHIELD GOLD 71mm GRAFT;  Surgeon: Rosetta Posner, MD;  Location: [place];  Service: Vascular;  Laterality: Right;  . OTHER SURGICAL HISTORY     POST RIGHT FEMOROPOPLITEAL BYPASS GRAFT  . PERIPHERAL VASCULAR CATHETERIZATION  11/05/2014   Procedure: LOWER EXTREMITY ANGIOGRAPHY;  Surgeon: Angelia Mould, MD;  Location: [facility] C[facility] LAB;  Service: Cardiovascular;;  . VEIN HARVEST Left 04/24/2019   Procedure: LEFT LEG SAPHENOUS VEIN HARVEST;  Surgeon: Rosetta Posner, MD;  Location: [facility] OR;  Service: Vascular;  Laterality: Left;     Social History   reports that he quit smoking about 14 months ago. He has a 40.00 pack-year smoking history. He has never used smokeless tobacco. He reports that he does not drink alcohol or use drugs.   Family History   His family history includes Diabetes in his mother; Hyperlipidemia in his sister; Hypertension in his sister.   Allergies No Known Allergies   Home Medications  Prior to Admission medications   Medication Sig Start Date End Date Taking? Authorizing Provider  amLODipine (NORVASC) 5 MG tablet Take 1 tablet (5 mg total) by mouth daily. 01/05/19  Yes Martinique, Peter M, MD  ANORO ELLIPTA 62.5-25 MCG/INH AEPB INHALE 1 PUFF BY MOUTH ONCE DAILY . APPOINTMENT REQUIRED FOR FUTURE REFILLS Patient taking differently: Inhale 1 puff into the lungs daily.  07/31/19  Yes Rigoberto Noel, MD  apixaban (ELIQUIS) 5 MG TABS tablet Take 1 tablet (5 mg total) by mouth 2 (two) times daily. 12/19/19  Yes [place], Peter M, MD  carvedilol (COREG) 12.5 MG tablet TAKE 1 TABLET TWICE DAILY WITH MEALS Patient taking differently: Take 12.5 mg by mouth 2 (two) times a day.  01/05/19  Yes [place],  Peter M, MD  furosemide (LASIX) 40 MG tablet TAKE 1 TABLET (40 MG TOTAL) BY MOUTH DAILY. 03/27/19  Yes [place], Peter M, MD  guaiFENesin (MUCINEX) 600 MG 12 hr tablet Take 2 tablets (1,200 mg total) by mouth 2 (two) times daily. Patient taking differently: Take 600 mg by mouth 2 (two) times daily.  11/30/18  Yes Lavina Hamman, MD  insulin glargine (LANTUS) 100 UNIT/ML injection Inject 15 Units into the skin at bedtime.   Yes [provider]  metFORMIN (GLUCOPHAGE) 500 MG tablet Take 500 mg by mouth 2 (two) times a day.  Yes [provider]  OXYGEN Inhale 4.5 L into the lungs continuous.   Yes [provider]  quinapril (ACCUPRIL) 10 MG tablet TAKE 1 TABLET (10 MG TOTAL) DAILY. Patient taking differently: Take 10 mg by mouth daily.  03/27/19  Yes [place], Peter M, MD  ACCU-CHEK FASTCLIX LANCETS MISC  07/30/18   [provider]  Accu-Chek FastClix Lancets MISC USE TO MONITOR BLOOD GLUCOSE 2 TIMES DAILY 11/06/19   [provider]  ACCU-CHEK GUIDE test strip  11/30/18   [provider]  BD PEN NEEDLE NANO U/F 32G X 4 MM MISC  08/06/18   [provider]  glucose blood (ACCU-CHEK GUIDE) test strip one strip (1 each dose) by Other route 2 (two) times daily. Test blood glucose 2 times a day. 11/21/19   [provider]  Insulin Pen Needle (BD PEN NEEDLE NANO U/F) 32G X 4 MM MISC USE TO INJECT INTO THE SKIN DAILY. 11/06/19   [provider]  pneumococcal 13-valent conjugate vaccine (PREVNAR 13) SUSP injection Inject into the muscle. 12/21/17   [provider]  rosuvastatin (CRESTOR) 40 MG tablet Take 1 tablet (40 mg total) by mouth daily. 12/01/19 02/29/20  [place], Peter M, MD  umeclidinium-vilanterol (ANORO ELLIPTA) 62.5-25 MCG/INH AEPB Inhale 1 puff into the lungs daily. 08/07/19   Lauraine Rinne, NP     Critical care time: 31 minutes

## 2019-12-24 NOTE — Consult Note (Addendum)
Renal Service Consult Note [facility]  Kyle [name] 12/24/2019 [name] Requesting Physician:  Dr Erskine Emery  Reason for Consult:  AKI HPI: The patient is a 69 y.o. year-old with h/o pulm HTN, PAD (sp aorto-bifem bypass  '16), HTN, DCM EF 20-25% (remote echo), COPD presented early this am w/ abd pain , nausea/ vomiting and diarrhea for a week or so. Can't keep fluids down. Mid upper abd pain. Has COPD on 4L home O2. In ED labs showed creat 14, K 6.3, on lasix and ace inhibitor at home. Pt admitted to CCM. Asked to see for AKI.   Creat 1.47 on 11/30/19.  Baseline creat from Jun 2020 and from [place] and feb 2021 is 1.4- 1.7.  Pt seen in ICU, vague historian and HOH, but is fully oriented. No sob or chest pain. +n/v/d at home as above. No urinary changes, no hematuria.   ROS  denies CP  no joint pain   no HA  no blurry vision  no rash    Past Medical History  Past Medical History:  Diagnosis Date  . Aortic insufficiency    MODERATE WITH A BICUSPID AORTIC VAVLE  . Arterial occlusive disease    MULTILEVEL  . CHF (congestive heart failure) ([place])   . COPD (chronic obstructive pulmonary disease) ([place])   . Dilated cardiomyopathy ([facility])    WITH EJECTION FRACTION DOWN TO 20-25%--WITH CONGESTIVE HEART FAILURE  . Edema    LOWER EXTREMETIES  . Hypertension   . Normal coronary arteries 2009  . Orthopnea   . Peripheral arterial disease ([place])   . Pulmonary hypertension ([place])   . SOB (shortness of breath)    Past Surgical History  Past Surgical History:  Procedure Laterality Date  . ABDOMINAL AORTAGRAM N/A 11/05/2014   Procedure: ABDOMINAL Maxcine Ham;  Surgeon: Angelia Mould, MD;  Location: [facility] CATH LAB;  Service: Cardiovascular;  Laterality: N/A;  . ABDOMINAL AORTOGRAM W/LOWER EXTREMITY Bilateral 04/21/2019   Procedure: ABDOMINAL AORTOGRAM W/LOWER EXTREMITY;  Surgeon: Angelia Mould, MD;  Location: [place] CV LAB;  Service: Cardiovascular;   Laterality: Bilateral;  . AORTA - BILATERAL FEMORAL ARTERY BYPASS GRAFT N/A 12/18/2014   Procedure: AORTOBIFEMORAL BYPASS GRAFT;  Surgeon: Angelia Mould, MD;  Location: [place];  Service: Vascular;  Laterality: N/A;  . CARDIAC CATHETERIZATION  2009   Nl Cors, EF 20%  . COLONOSCOPY    . ENDARTERECTOMY FEMORAL Left 12/18/2014   Procedure: ENDARTERECTOMY FEMORAL;  Surgeon: Angelia Mould, MD;  Location: [place];  Service: Vascular;  Laterality: Left;  . FALSE ANEURYSM REPAIR Right 04/24/2019   Procedure: REPAIR OF RIGHT FEMORAL ARTERY PSEUDOANEURYSM;  Surgeon: Rosetta Posner, MD;  Location: [place];  Service: Vascular;  Laterality: Right;  . FEMORAL-POPLITEAL BYPASS GRAFT  02/21/2011   right  . FEMORAL-TIBIAL BYPASS GRAFT Right 04/24/2019   Procedure: REDO BYPASS GRAFT RIGHT FEMORAL-TIBIAL ARTERY USING LEFT LEG VEIN & HEMASHIELD GOLD 32mm GRAFT;  Surgeon: Rosetta Posner, MD;  Location: [place];  Service: Vascular;  Laterality: Right;  . OTHER SURGICAL HISTORY     POST RIGHT FEMOROPOPLITEAL BYPASS GRAFT  . PERIPHERAL VASCULAR CATHETERIZATION  11/05/2014   Procedure: LOWER EXTREMITY ANGIOGRAPHY;  Surgeon: Angelia Mould, MD;  Location: [facility] CATH LAB;  Service: Cardiovascular;;  . VEIN HARVEST Left 04/24/2019   Procedure: LEFT LEG SAPHENOUS VEIN HARVEST;  Surgeon: Rosetta Posner, MD;  Location: [place];  Service: Vascular;  Laterality: Left;   Family History  Family  History  Problem Relation Age of Onset  . Diabetes Mother   . Hyperlipidemia Sister   . Hypertension Sister    Social History  reports that he quit smoking about 14 months ago. He has a 40.00 pack-year smoking history. He has never used smokeless tobacco. He reports that he does not drink alcohol or use drugs. Allergies No Known Allergies Home medications Prior to Admission medications   Medication Sig Start Date End Date Taking? Authorizing Provider  amLODipine (NORVASC) 5 MG tablet Take 1 tablet (5 mg total) by mouth daily.  01/05/19  Yes [place], Peter M, MD  ANORO ELLIPTA 62.5-25 MCG/INH AEPB INHALE 1 PUFF BY MOUTH ONCE DAILY . APPOINTMENT REQUIRED FOR FUTURE REFILLS Patient taking differently: Inhale 1 puff into the lungs daily.  07/31/19  Yes Rigoberto Noel, MD  apixaban (ELIQUIS) 5 MG TABS tablet Take 1 tablet (5 mg total) by mouth 2 (two) times daily. 12/19/19  Yes [place], Peter M, MD  carvedilol (COREG) 12.5 MG tablet TAKE 1 TABLET TWICE DAILY WITH MEALS Patient taking differently: Take 12.5 mg by mouth 2 (two) times a day.  01/05/19  Yes [place], Peter M, MD  furosemide (LASIX) 40 MG tablet TAKE 1 TABLET (40 MG TOTAL) BY MOUTH DAILY. 03/27/19  Yes [place], Peter M, MD  guaiFENesin (MUCINEX) 600 MG 12 hr tablet Take 2 tablets (1,200 mg total) by mouth 2 (two) times daily. Patient taking differently: Take 600 mg by mouth 2 (two) times daily.  11/30/18  Yes Lavina Hamman, MD  insulin glargine (LANTUS) 100 UNIT/ML injection Inject 15 Units into the skin at bedtime.   Yes [provider]  metFORMIN (GLUCOPHAGE) 500 MG tablet Take 500 mg by mouth 2 (two) times a day.    Yes [provider]  OXYGEN Inhale 4.5 L into the lungs continuous.   Yes [provider]  quinapril (ACCUPRIL) 10 MG tablet TAKE 1 TABLET (10 MG TOTAL) DAILY. Patient taking differently: Take 10 mg by mouth daily.  03/27/19  Yes [place], Peter M, MD  ACCU-CHEK FASTCLIX LANCETS MISC  07/30/18   [provider]  Accu-Chek FastClix Lancets MISC USE TO MONITOR BLOOD GLUCOSE 2 TIMES DAILY 11/06/19   [provider]  ACCU-CHEK GUIDE test strip  11/30/18   [provider]  BD PEN NEEDLE NANO U/F 32G X 4 MM MISC  08/06/18   [provider]  glucose blood (ACCU-CHEK GUIDE) test strip one strip (1 each dose) by Other route 2 (two) times daily. Test blood glucose 2 times a day. 11/21/19   [provider]  Insulin Pen Needle (BD PEN NEEDLE NANO U/F) 32G X 4 MM MISC USE TO INJECT INTO THE SKIN DAILY. 11/06/19    [provider]  pneumococcal 13-valent conjugate vaccine (PREVNAR 13) SUSP injection Inject into the muscle. 12/21/17   [provider]  rosuvastatin (CRESTOR) 40 MG tablet Take 1 tablet (40 mg total) by mouth daily. 12/01/19 02/29/20  [place], Peter M, MD  umeclidinium-vilanterol (ANORO ELLIPTA) 62.5-25 MCG/INH AEPB Inhale 1 puff into the lungs daily. 08/07/19   Lauraine Rinne, NP     Vitals:   12/24/19 1030 12/24/19 1032 12/24/19 1100 12/24/19 1200  BP: (!) 126/55 (!) 126/55 (!) 143/64 135/66  Pulse:  81 (!) 25 73  Resp: 18 17 (!) 24 19  SpO2:  92% (!) 57% 91%  Weight:      Height:       Exam Gen alert, in bed in  ICU, HOH, Ox 3 No rash, cyanosis or gangrene Sclera anicteric, throat clear and mildly dry   No jvd or bruits Chest clear bilat to bases, no rales RRR no MRG Abd soft ntnd no mass or ascites, slightly protuberant GU normal male, no cath in  MS no joint effusions or deformity Ext no LE edema, no wounds or ulcers Neuro is alert, Ox 3 , nf, no asterixis    Home meds:  - amlodipine 5 qd/ carvedilol 12.5 bid/ quinapril 10 qd/ furosemide 40 qd  - rosuvastatin 40 qd  - insulin glargine 15u hs/ metformin 500 bid  - home O2 4-5L/ umeclidinium-vilanterol qd  - apixaban 5 bid  - prn's/ vitamins/ supplements    CT abd 2/27 > bilat normal kidneys, no hydro   UA, UNa/ UCr - no UOP yet   Assessment/ Plan: 1. AoCKD 3 - in setting of vol depletion/ vomiting/ diarrhea and ACEi. B/l creat is 1.5. Hoping he will void w/ volume replacement, for meantime acute IV rx for hyperkalemia and po K+ lowering meds ordered. Will repeat labs this afternoon and make decision about HD or not.  Hold acei.  2. DM on insulin 3. Vol depletion - getting IVF's w/ bicarb, sp 1L NS. BP's are normal.  4. Lactic / metabolic acidosis - severe, but not septic appearing on exam 5. COPD - on home O2, get CXR baseline      Kelly Splinter  MD 12/24/2019, 1:49 PM  Recent Labs  Lab  12/24/19 0419  WBC 8.8  HGB 15.1   Recent Labs  Lab 12/24/19 0419 12/24/19 1005  K 6.8* 6.3*  BUN 143* 147*  CREATININE 14.97* 14.45*  CALCIUM 9.3 8.4*

## 2019-12-24 NOTE — ED Triage Notes (Signed)
Patient reports mid abdominal pain with emesis and diarrhea onset this week , denies fever or chills .

## 2019-12-24 NOTE — Progress Notes (Signed)
CRITICAL VALUE ALERT  Critical Value:  K+ 6.7  Date & Time Notied:  2/28 2125  Provider Notified: E-link MD via nurse Louin  Orders Received/Actions taken: no orders at this time

## 2019-12-24 NOTE — ED Notes (Signed)
Patient's O2 sats 85% on room air, patient placed on 4L supplemental O2 via n.c. with sat improvement to 93%. Pt reports he is supposed to wear O2 at home but does.

## 2019-12-24 NOTE — Progress Notes (Signed)
Spoke with Dr. Jonnie Finner: goal to try to avoid HD: q6h BMET.

## 2019-12-24 NOTE — Code Documentation (Signed)
69 yo male with AMS.  Initially was admitted with AMS, improved after admission to ICU to alert, oriented x4 and able to feed himself.  LSW 1930 at shift report.  At approximately 2045, pt found with blank stare straight ahead, nonverbal, not following commands, bilateral upper arm flaccidity. Code stroke initiated. CBG was 86. 1 amp D50 given prior to CT. Repeat CBG 196. NIHSS 19. Pt transport with IV pumps, monitor and oxygen to CT and then MRI. CTH neg for acute change. No seizure like activity reported. Dr . Cheral Marker informed the MRI was done and pt transported back to 60M for EEG/HD.

## 2019-12-24 NOTE — ED Provider Notes (Signed)
[facility] EMERGENCY DEPARTMENT Provider Note  CSN: [id number] Arrival date & time: 12/24/19 [id number]  Chief Complaint(s) Abdominal Pain  HPI Kyle [name] is a 69 y.o. male with a past medical history listed below including COPD on 4 L nasal cannula, peripheral vascular disease, CHF with most recent EF of 60 to 70% improved from prior, here for generalized abdominal discomfort with nausea, nonbloody nonbilious emesis, and diarrhea.  Onset was 1 to 1/2 weeks ago.  No suspicious food intake.  No fevers or chills.  Patient denies any chest pain or shortness of breath.  Denies any other physical complaints.  HPI  Past Medical History Past Medical History:  Diagnosis Date  . Aortic insufficiency    MODERATE WITH A BICUSPID AORTIC VAVLE  . Arterial occlusive disease    MULTILEVEL  . CHF (congestive heart failure) ([place])   . COPD (chronic obstructive pulmonary disease) ([place])   . Dilated cardiomyopathy (HCC)    WITH EJECTION FRACTION DOWN TO 20-25%--WITH CONGESTIVE HEART FAILURE  . Edema    LOWER EXTREMETIES  . Hypertension   . Normal coronary arteries 2009  . Orthopnea   . Peripheral arterial disease ([place])   . Pulmonary hypertension ([place])   . SOB (shortness of breath)    Patient Active Problem List   Diagnosis Date Noted  . Lactic acidosis 12/24/2019  . Uremia 12/24/2019  . High anion gap metabolic acidosis 0000000  . Atrial fibrillation, chronic ([place]) 12/24/2019  . Diabetes mellitus type 2, insulin dependent ([place]) 12/24/2019  . Hyperkalemia, diminished renal excretion 12/24/2019  . Healthcare maintenance 07/14/2019  . Medication management 07/14/2019  . PAD (peripheral artery disease) ([place]) 04/22/2019  . Atherosclerosis of artery of extremity with rest pain ([place]) 04/21/2019  . COPD with chronic bronchitis and emphysema ([place]) 12/16/2018  . Chronic respiratory failure with hypoxia ([place]) 12/16/2018  . Acute renal failure (ARF) ([place])   . Protein-calorie  malnutrition, severe ([place]) 12/18/2014  . Aortoiliac occlusive disease ([place]) 12/16/2014  . PVD (peripheral vascular disease) ([place]) 10/24/2014  . Aortic stenosis 09/12/2014  . 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 Pulmonary hypertension ([place]) 09/12/2014  . Dyslipidemia 09/12/2014  . Atherosclerosis of native arteries of extremity with intermittent claudication ([place]) 05/11/2012  . Aortic insufficiency   . Hypertension   . Peripheral arterial disease ([place])   . Dilated cardiomyopathy ([place])   . Chronic systolic CHF (congestive heart failure) ([place])    Home Medication(s) Prior to Admission medications   Medication Sig Start Date End Date Taking? Authorizing Provider  amLODipine (NORVASC) 5 MG tablet Take 1 tablet (5 mg total) by mouth daily. 01/05/19  Yes [place], Peter M, MD  ANORO ELLIPTA 62.5-25 MCG/INH AEPB INHALE 1 PUFF BY MOUTH ONCE DAILY . APPOINTMENT REQUIRED FOR FUTURE REFILLS Patient taking differently: Inhale 1 puff into the lungs daily.  07/31/19  Yes Rigoberto Noel, MD  apixaban (ELIQUIS) 5 MG TABS tablet Take 1 tablet (5 mg total) by mouth 2 (two) times daily. 12/19/19  Yes [place], Peter M, MD  carvedilol (COREG) 12.5 MG tablet TAKE 1 TABLET TWICE DAILY WITH MEALS Patient taking differently: Take 12.5 mg by mouth 2 (two) times a day.  01/05/19  Yes [place], Peter M, MD  furosemide (LASIX) 40 MG tablet TAKE 1 TABLET (40 MG TOTAL) BY MOUTH DAILY. 03/27/19  Yes [place], Peter M, MD  guaiFENesin (MUCINEX) 600 MG 12 hr tablet Take 2 tablets (1,200 mg total) by mouth 2 (two) times daily. Patient taking differently: Take 600  mg by mouth 2 (two) times daily.  11/30/18  Yes Lavina Hamman, MD  insulin glargine (LANTUS) 100 UNIT/ML injection Inject 15 Units into the skin at bedtime.   Yes [provider]  metFORMIN (GLUCOPHAGE) 500 MG tablet Take 500 mg by mouth 2 (two) times a day.    Yes [provider]  OXYGEN Inhale 4.5 L into the lungs continuous.   Yes [provider]  quinapril (ACCUPRIL) 10 MG tablet TAKE 1 TABLET (10 MG TOTAL) DAILY. Patient taking differently: Take 10 mg by mouth daily.  03/27/19  Yes [place], Peter M, MD  ACCU-CHEK FASTCLIX LANCETS MISC  07/30/18   [provider]  Accu-Chek FastClix Lancets MISC USE TO MONITOR BLOOD GLUCOSE 2 TIMES DAILY 11/06/19   [provider]  ACCU-CHEK GUIDE test strip  11/30/18   [provider]  BD PEN NEEDLE NANO U/F 32G X 4 MM MISC  08/06/18   [provider]  glucose blood (ACCU-CHEK GUIDE) test strip one strip (1 each dose) by Other route 2 (two) times daily. Test blood glucose 2 times a day. 11/21/19   [provider]  Insulin Pen Needle (BD PEN NEEDLE NANO U/F) 32G X 4 MM MISC USE TO INJECT INTO THE SKIN DAILY. 11/06/19   [provider]  pneumococcal 13-valent conjugate vaccine (PREVNAR 13) SUSP injection Inject into the muscle. 12/21/17   [provider]  rosuvastatin (CRESTOR) 40 MG tablet Take 1 tablet (40 mg total) by mouth daily. 12/01/19 02/29/20  [place], Peter M, MD  umeclidinium-vilanterol (ANORO ELLIPTA) 62.5-25 MCG/INH AEPB Inhale 1 puff into the lungs daily. 08/07/19   Lauraine Rinne, NP                                                                                                                                    Past Surgical History Past Surgical History:  Procedure Laterality Date  . ABDOMINAL AORTAGRAM N/A 11/05/2014   Procedure: ABDOMINAL Maxcine Ham;  Surgeon: Angelia Mould, MD;  Location: [facility] CATH LAB;  Service: Cardiovascular;  Laterality: N/A;  . ABDOMINAL AORTOGRAM W/LOWER EXTREMITY Bilateral 04/21/2019   Procedure: ABDOMINAL AORTOGRAM W/LOWER EXTREMITY;  Surgeon: Angelia Mould, MD;  Location: [place] CV LAB;  Service: Cardiovascular;  Laterality: Bilateral;  . AORTA - BILATERAL FEMORAL ARTERY BYPASS GRAFT N/A 12/18/2014   Procedure: AORTOBIFEMORAL BYPASS GRAFT;  Surgeon: Angelia Mould, MD;   Location: [place];  Service: Vascular;  Laterality: N/A;  . CARDIAC CATHETERIZATION  2009   Nl Cors, EF 20%  . COLONOSCOPY    . ENDARTERECTOMY FEMORAL Left 12/18/2014   Procedure: ENDARTERECTOMY FEMORAL;  Surgeon: Angelia Mould, MD;  Location: [place];  Service: Vascular;  Laterality: Left;  . FALSE ANEURYSM REPAIR Right 04/24/2019   Procedure: REPAIR OF RIGHT FEMORAL ARTERY PSEUDOANEURYSM;  Surgeon: Rosetta Posner, MD;  Location: [place];  Service: Vascular;  Laterality: Right;  .  FEMORAL-POPLITEAL BYPASS GRAFT  02/21/2011   right  . FEMORAL-TIBIAL BYPASS GRAFT Right 04/24/2019   Procedure: REDO BYPASS GRAFT RIGHT FEMORAL-TIBIAL ARTERY USING LEFT LEG VEIN & HEMASHIELD GOLD 31mm GRAFT;  Surgeon: Rosetta Posner, MD;  Location: [place];  Service: Vascular;  Laterality: Right;  . OTHER SURGICAL HISTORY     POST RIGHT FEMOROPOPLITEAL BYPASS GRAFT  . PERIPHERAL VASCULAR CATHETERIZATION  11/05/2014   Procedure: LOWER EXTREMITY ANGIOGRAPHY;  Surgeon: Angelia Mould, MD;  Location: [facility] C[facility] LAB;  Service: Cardiovascular;;  . VEIN HARVEST Left 04/24/2019   Procedure: LEFT LEG SAPHENOUS VEIN HARVEST;  Surgeon: Rosetta Posner, MD;  Location: [facility] OR;  Service: Vascular;  Laterality: Left;   Family History Family History  Problem Relation Age of Onset  . Diabetes Mother   . Hyperlipidemia Sister   . Hypertension Sister     Social History Social History   Tobacco Use  . Smoking status: Former Smoker    Packs/day: 1.00    Years: 40.00    Pack years: 40.00    Quit date: 10/15/2018    Years since quitting: 1.1  . Smokeless tobacco: Never Used  . Tobacco comment: quit 2010 and quit again 2019  Substance Use Topics  . Alcohol use: No    Alcohol/week: 0.0 standard drinks  . Drug use: No   Allergies Patient has no known allergies.  Review of Systems Review of Systems All other systems are reviewed and are negative for acute change except as noted in the HPI  Physical Exam Vital Signs  I  have reviewed the triage vital signs BP (!) 148/65   Pulse 63   Resp (!) 21   Ht 5\' 11"  (1.803 m)   Wt 80 kg   SpO2 91%   BMI 24.60 kg/m   Physical Exam Vitals reviewed.  Constitutional:      General: He is not in acute distress.    Appearance: He is well-developed. He is not diaphoretic.  HENT:     Head: Normocephalic and atraumatic.     Nose: Nose normal.  Eyes:     General: No scleral icterus.       Right eye: No discharge.        Left eye: No discharge.     Conjunctiva/sclera: Conjunctivae normal.     Pupils: Pupils are equal, round, and reactive to light.  Cardiovascular:     Rate and Rhythm: Normal rate and regular rhythm.     Heart sounds: No murmur. No friction rub. No gallop.   Pulmonary:     Effort: Pulmonary effort is normal. No respiratory distress.     Breath sounds: Normal breath sounds. No stridor. No rales.  Abdominal:     General: There is no distension.     Palpations: Abdomen is soft.     Tenderness: There is no abdominal tenderness.  Musculoskeletal:        General: No tenderness.     Cervical back: Normal range of motion and neck supple.  Skin:    General: Skin is warm and dry.     Findings: No erythema or rash.  Neurological:     Mental Status: He is alert and oriented to person, place, and time.  Psychiatric:        Speech: Speech is delayed.        Behavior: Behavior is slowed.     ED Results and Treatments Labs (all labs ordered are listed, but only abnormal results are displayed) Labs  Reviewed  LIPASE, BLOOD - Abnormal; Notable for the following components:      Result Value   Lipase 71 (*)    All other components within normal limits  COMPREHENSIVE METABOLIC PANEL - Abnormal; Notable for the following components:   Potassium 6.8 (*)    Chloride 97 (*)    CO2 8 (*)    Glucose, Bld 45 (*)    BUN 143 (*)    Creatinine, Ser 14.97 (*)    Albumin 3.0 (*)    Total Bilirubin 2.5 (*)    GFR calc non Af Amer 3 (*)    GFR calc Af Amer  3 (*)    Anion gap 38 (*)    All other components within normal limits  LACTIC ACID, PLASMA - Abnormal; Notable for the following components:   Lactic Acid, Venous 10.2 (*)    All other components within normal limits  CBG MONITORING, ED - Abnormal; Notable for the following components:   Glucose-Capillary 120 (*)    All other components within normal limits  CBG MONITORING, ED - Abnormal; Notable for the following components:   Glucose-Capillary 152 (*)    All other components within normal limits  SARS CORONAVIRUS 2 (TAT 6-24 HRS)  CBC  CK  LACTIC ACID, PLASMA  BASIC METABOLIC PANEL  BASIC METABOLIC PANEL  HIV ANTIBODY (ROUTINE TESTING W REFLEX)  HEMOGLOBIN A1C  CBG MONITORING, ED  CBG MONITORING, ED                                                                                                                         EKG  EKG Interpretation  Date/Time:  Sunday December 24 2019 04:03:55 EST Ventricular Rate:  68 PR Interval:  170 QRS Duration: 90 QT Interval:  440 QTC Calculation: 467 R Axis:   -38 Text Interpretation: Normal sinus rhythm Left axis deviation Abnormal ECG NO STEMI. Confirmed by Addison Lank [phone]) on 12/24/2019 5:05:09 AM      Radiology CT Renal Stone Study  Result Date: 12/24/2019 CLINICAL DATA:  Mid abdominal pain with vomiting and diarrhea. EXAM: CT ABDOMEN AND PELVIS WITHOUT CONTRAST TECHNIQUE: Multidetector CT imaging of the abdomen and pelvis was performed following the standard protocol without IV contrast. COMPARISON:  None. FINDINGS: Lower chest: Heart is enlarged. No substantial pericardial effusion. Minimal left lower lobe collapse/consolidation noted. 5 mm right lower lobe pulmonary nodule visible on 37/5. Probable atelectasis in the paraspinal right lower lobe. Hepatobiliary: No focal abnormality in the liver on this study without intravenous contrast. There is no evidence for gallstones, gallbladder wall thickening, or pericholecystic fluid.  No intrahepatic or extrahepatic biliary dilation. Pancreas: No focal mass lesion. No dilatation of the main duct. No intraparenchymal cyst. No peripancreatic edema. Spleen: No splenomegaly. No focal mass lesion. Adrenals/Urinary Tract: No adrenal nodule or mass. No stones are seen in either kidney or ureter. No bladder stones mild right renal atrophy noted. Stomach/Bowel: Stomach is distended with fluid. Duodenum is normally positioned  as is the ligament of Treitz. No small bowel wall thickening. No small bowel dilatation. The terminal ileum is normal. The appendix is normal. No gross colonic mass. No colonic wall thickening. Diverticular changes are noted in the left colon without evidence of diverticulitis. Vascular/Lymphatic: Status post aorto bi femoral bypass graft. There is no gastrohepatic or hepatoduodenal ligament lymphadenopathy. No retroperitoneal or mesenteric lymphadenopathy. No pelvic sidewall lymphadenopathy. Reproductive: The prostate gland and seminal vesicles are unremarkable. Other: No intraperitoneal free fluid. Musculoskeletal: No worrisome lytic or sclerotic osseous abnormality. IMPRESSION: 1. No acute findings in the abdomen or pelvis. 2. Minimal left lower lobe collapse/consolidation. 3. 5 mm right lower lobe pulmonary nodule. No follow-up needed if patient is low-risk. Non-contrast chest CT can be considered in 12 months if patient is high-risk. This recommendation follows the consensus statement: Guidelines for Management of Incidental Pulmonary Nodules Detected on CT Images: From the Fleischner Society 2017; Radiology 2017; 284:228-243. 4.  Aortic Atherosclerois (ICD10-170.0) Electronically Signed   By: Misty Stanley M.D.   On: 12/24/2019 07:10    Pertinent labs & imaging results that were available during my care of the patient were reviewed by me and considered in my medical decision making (see chart for details).  Medications Ordered in ED Medications  sodium zirconium  cyclosilicate (LOKELMA) packet 10 g (has no administration in time range)  albuterol (PROVENTIL) (2.5 MG/3ML) 0.083% nebulizer solution 10 mg (has no administration in time range)  0.45 % sodium chloride infusion ( Intravenous New Bag/Given 12/24/19 0731)  sodium bicarbonate 150 mEq in dextrose 5 % 1,000 mL infusion (has no administration in time range)  sodium bicarbonate injection 50 mEq (has no administration in time range)  heparin injection 5,000 Units (has no administration in time range)  ondansetron (ZOFRAN) injection 4 mg (has no administration in time range)  acetaminophen (TYLENOL) tablet 650 mg (has no administration in time range)  insulin aspart (novoLOG) injection 0-15 Units (has no administration in time range)  insulin aspart (novoLOG) injection 0-5 Units (has no administration in time range)  umeclidinium-vilanterol (ANORO ELLIPTA) 62.5-25 MCG/INH 1 puff (has no administration in time range)  guaiFENesin (MUCINEX) 12 hr tablet 600 mg (has no administration in time range)  sodium chloride flush (NS) 0.9 % injection 3 mL (3 mLs Intravenous Given 12/24/19 0447)  alum & mag hydroxide-simeth (MAALOX/MYLANTA) 200-200-20 MG/5ML suspension 30 mL (30 mLs Oral Given 12/24/19 0603)  hyoscyamine (LEVSIN SL) SL tablet 0.25 mg (0.25 mg Sublingual Given 12/24/19 0603)  sodium chloride 0.9 % bolus 1,000 mL (1,000 mLs Intravenous New Bag/Given 12/24/19 0607)  dextrose 50 % solution 50 mL (50 mLs Intravenous Given 12/24/19 0603)  insulin aspart (novoLOG) injection 5 Units (5 Units Intravenous Given 12/24/19 0727)    And  dextrose 50 % solution 50 mL (50 mLs Intravenous Given 12/24/19 0726)  Procedures .Critical Care Performed by: Fatima Blank, MD Authorized by: Fatima Blank, MD     CRITICAL CARE Performed by: Grayce Sessions Satoria Dunlop Total critical  care time: 60 minutes Critical care time was exclusive of separately billable procedures and treating other patients. Critical care was necessary to treat or prevent imminent or life-threatening deterioration. Critical care was time spent personally by me on the following activities: development of treatment plan with patient and/or surrogate as well as nursing, discussions with consultants, evaluation of patient's response to treatment, examination of patient, obtaining history from patient or surrogate, ordering and performing treatments and interventions, ordering and review of laboratory studies, ordering and review of radiographic studies, pulse oximetry and re-evaluation of patient's condition.   (including critical care time)  Medical Decision Making / ED Course I have reviewed the nursing notes for this encounter and the patient's prior records (if available in EHR or on provided paperwork).   Kyle [name] was evaluated in Emergency Department on 12/24/2019 for the symptoms described in the history of present illness. He was evaluated in the context of the global COVID-19 pandemic, which necessitated consideration that the patient might be at risk for infection with the SARS-CoV-2 virus that causes COVID-19. Institutional protocols and algorithms that pertain to the evaluation of patients at risk for COVID-19 are in a state of rapid change based on information released by regulatory bodies including the CDC and federal and state organizations. These policies and algorithms were followed during the patient's care in the ED.  Patient presents with generalized abdominal discomfort with nausea, vomiting, diarrhea. Afebrile with stable vital signs. On exam abdomen is nontender. Patient is alert and oriented however is slow to respond.  Initially I ordered a CT angio to assess for mesenteric ischemia however labs revealed acute renal failure  Labs notable for acute renal failure with a  creatinine of 15, up from 1.6  3 weeks ago. Glucose of 47, and given D50 - no change in mental status.  BUN at 143.  This may explain the patient's mental status.  Potassium is elevated at 6.8 - temporizing measures started.   CT Noncon ordered and did not reveal evidence of urinary obstruction, that would be attributing to his renal failure.  On record review, I noted that patient was recently started on Crestor.  There is a note from the wife stating that the patient has been taking double the dose of Crestor.  Will obtain CK to assess for possible rhabdo.  Spoke with Dr. Jonnie Finner who will evaluate the patient and provide additional recommendations.    Lactic acid elevated at 10.6.  Etiology of patient's renal failure undetermined at this point.  Possible prerenal superimposed with some nephrotoxic medications.  Spoke to the hospitalist and critical care team regarding admission.  Critical care will admit the patient.      Final Clinical Impression(s) / ED Diagnoses Final diagnoses:  Acute renal failure, unspecified acute renal failure type (HCC)  Hyperkalemia  Lactic acid acidosis      This chart was dictated using voice recognition software.  Despite best efforts to proofread,  errors can occur which can change the documentation meaning.   Fatima Blank, MD 12/24/19 [phone]

## 2019-12-24 NOTE — Procedures (Signed)
Patient Name: Kyle [name]  MRN: [id number]  Epilepsy Attending: Lora Havens  Referring Physician/Provider: Dr Kerney Elbe Date: 12/24/2019 Duration: 25.25 mins  Patient history: 69 year old male who presented to the hospital today with acute renal failure and AMS. Had sudden onset of worsened AMS with decreased movement. MRI Brain showed no acute finding. 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  Level of alertness: comatose  AEDs during EEG study: None  Technical aspects: This EEG study was done with scalp electrodes positioned according to the 10-20 International system of electrode placement. Electrical activity was acquired at a sampling rate of 500Hz  and reviewed with a high frequency filter of 70Hz  and a low frequency filter of 1Hz . EEG data were recorded continuously and digitally stored.   DESCRIPTION:  EEG showed continuous generalized 2-3hz  rhythmic low amplitude delta slowing. Intermittent sharply contoured, generalized, maximal bifrontal 4-5hz  theta slowing was also noted. EEG was reactive to noxious stimulation.  Hyperventilation and photic stimulation were not performed.  ABNORMALITY - Continuous rhythmic slow, generalized  IMPRESSION: This study is suggestive of profound diffuse encephalopathy, non specific to etiology.  No seizures or epileptiform discharges were seen throughout the recording.  Dr Cheral Marker was notifed    Lora Havens

## 2019-12-24 NOTE — Progress Notes (Signed)
[place] Progress Note Patient Name: Kyle [name] DOB: 30-Dec-1950 MRN: [id number]   Date of Service  12/24/2019  HPI/Events of Note  Red alert on eLink for acute change in mental status. Patient seen without regard. Eyes open. Seen with spontaneous of left arm but did not withdraw to pain. Does have a cough reflex.  eICU Interventions   ABG iStat ordered to check K as previously 6.4. Repeat labs pending  Accucheck 86, give an amp D50. K 6.4 on iStat, hyperkalemia protocol ordered  Possibly post ictal  Head CT ordered, code stroke called     Intervention Category Major Interventions: Change in mental status - evaluation and management  Shona Needles Jase Himmelberger 12/24/2019, 8:46 PM

## 2019-12-24 NOTE — Progress Notes (Addendum)
[place] Progress Note Patient Name: Kyle [name] DOB: [birth year] MRN: [id number]   Date of Service  12/24/2019  HPI/Events of Note  Notified of snoring. Mental status remains unchanged. MRI negative. K 6.7. Nephrology aware and saw patient.  eICU Interventions  Dialysis being facilitated Bedside CCM team aware of recent events and will check on patient. Ordered nasal airway with instruction not to forcefully insert if resistance met due to bleeding risk.        Judd Lien 12/24/2019, 10:46 PM

## 2019-12-24 NOTE — Consult Note (Addendum)
Consult Note   Kyle [name] [id number] DOB: 22-Apr-1951 DOA: 12/24/2019  PCP: Alonna Buckler, MD Consultants:  [place] -cardiology; Elsworth Soho - pulmonology; Scot Dock - vascular Patient coming from:  Home - lives with wife; NOK: Wife, Tyten Uptegrove, [phone]  Chief Complaint: Abdominal pain, n/v/d  HPI: Kyle [name] is a 69 y.o. male with medical history significant of pulmonary HTN; PAD; HTN; COPD on 4L O2; and chronic systolic CHF (recently normalized on Echo) presenting with abdominal pain, n/v/d.  He is slow to respond to questions and needs repeating questions over and over.  He reports pain in his chest and abdomen for about a week.  +n/v/d.  No blood.  He is having 3-4 stools/day and emesis 3-4 times/day, clear emesis.  He is drinking fluids but unable to hold it down.  Mid-upper abdomen is the source of the pain.  He doesn't think this was a food-borne illness.  His is cough but nonproductive.  Mild SOB, on his usual 4L O2.  No sick contacts.   ED Course:  Abdominal pain, n/v/d.  Appears confused, uremic.  No contrast due to renal failure - ?mesenteric ischemia.  Marked acute renal failure with hyperkalemia.  Lactate 10.2.  Started on Crestor a month ago and taking double dose, CK pending.  On Lasix.  Nephrology to see, PCCM to see.   Review of Systems: As per HPI; otherwise review of systems reviewed and negative.   Ambulatory Status:  Ambulates without assistance  Past Medical History:  Diagnosis Date  . Aortic insufficiency    MODERATE WITH A BICUSPID AORTIC VAVLE  . Arterial occlusive disease    MULTILEVEL  . CHF (congestive heart failure) ([place])   . COPD (chronic obstructive pulmonary disease) ([place])   . Dilated cardiomyopathy (HCC)    WITH EJECTION FRACTION DOWN TO 20-25%--WITH CONGESTIVE HEART FAILURE  . Edema    LOWER EXTREMETIES  . Hypertension   . Normal coronary arteries 2009  . Orthopnea   . Peripheral arterial disease ([place])   . Pulmonary hypertension ([place])    . SOB (shortness of breath)     Past Surgical History:  Procedure Laterality Date  . ABDOMINAL AORTAGRAM N/A 11/05/2014   Procedure: ABDOMINAL Maxcine Ham;  Surgeon: Angelia Mould, MD;  Location: [facility] CATH LAB;  Service: Cardiovascular;  Laterality: N/A;  . ABDOMINAL AORTOGRAM W/LOWER EXTREMITY Bilateral 04/21/2019   Procedure: ABDOMINAL AORTOGRAM W/LOWER EXTREMITY;  Surgeon: Angelia Mould, MD;  Location: [place] CV LAB;  Service: Cardiovascular;  Laterality: Bilateral;  . AORTA - BILATERAL FEMORAL ARTERY BYPASS GRAFT N/A 12/18/2014   Procedure: AORTOBIFEMORAL BYPASS GRAFT;  Surgeon: Angelia Mould, MD;  Location: [place];  Service: Vascular;  Laterality: N/A;  . CARDIAC CATHETERIZATION  2009   Nl Cors, EF 20%  . COLONOSCOPY    . ENDARTERECTOMY FEMORAL Left 12/18/2014   Procedure: ENDARTERECTOMY FEMORAL;  Surgeon: Angelia Mould, MD;  Location: [place];  Service: Vascular;  Laterality: Left;  . FALSE ANEURYSM REPAIR Right 04/24/2019   Procedure: REPAIR OF RIGHT FEMORAL ARTERY PSEUDOANEURYSM;  Surgeon: Rosetta Posner, MD;  Location: [place];  Service: Vascular;  Laterality: Right;  . FEMORAL-POPLITEAL BYPASS GRAFT  02/21/2011   right  . FEMORAL-TIBIAL BYPASS GRAFT Right 04/24/2019   Procedure: REDO BYPASS GRAFT RIGHT FEMORAL-TIBIAL ARTERY USING LEFT LEG VEIN & HEMASHIELD GOLD 70mm GRAFT;  Surgeon: Rosetta Posner, MD;  Location: [place];  Service: Vascular;  Laterality: Right;  . OTHER SURGICAL HISTORY  POST RIGHT FEMOROPOPLITEAL BYPASS GRAFT  . PERIPHERAL VASCULAR CATHETERIZATION  11/05/2014   Procedure: LOWER EXTREMITY ANGIOGRAPHY;  Surgeon: Angelia Mould, MD;  Location: [facility] C[facility] LAB;  Service: Cardiovascular;;  . VEIN HARVEST Left 04/24/2019   Procedure: LEFT LEG SAPHENOUS VEIN HARVEST;  Surgeon: Rosetta Posner, MD;  Location: [facility] OR;  Service: Vascular;  Laterality: Left;    Social History   Socioeconomic History  . Marital status: Married    Spouse name: Not  on file  . Number of children: 1  . Years of education: Not on file  . Highest education level: Not on file  Occupational History  . Occupation: retired    Fish farm manager: [employer]  Tobacco Use  . Smoking status: Former Smoker    Packs/day: 1.00    Years: 40.00    Pack years: 40.00    Quit date: 10/15/2018    Years since quitting: 1.1  . Smokeless tobacco: Never Used  . Tobacco comment: quit 2010 and quit again 2019  Substance and Sexual Activity  . Alcohol use: No    Alcohol/week: 0.0 standard drinks  . Drug use: No  . Sexual activity: Not on file  Other Topics Concern  . Not on file  Social History Narrative  . Not on file   Social Determinants of Health   Financial Resource Strain:   . Difficulty of Paying Living Expenses: Not on file  Food Insecurity:   . Worried About Charity fundraiser in the Last Year: Not on file  . Ran Out of Food in the Last Year: Not on file  Transportation Needs:   . Lack of Transportation (Medical): Not on file  . Lack of Transportation (Non-Medical): Not on file  Physical Activity:   . Days of Exercise per Week: Not on file  . Minutes of Exercise per Session: Not on file  Stress:   . Feeling of Stress : Not on file  Social Connections:   . Frequency of Communication with Friends and Family: Not on file  . Frequency of Social Gatherings with Friends and Family: Not on file  . Attends Religious Services: Not on file  . Active Member of Clubs or Organizations: Not on file  . Attends Archivist Meetings: Not on file  . Marital Status: Not on file  Intimate Partner Violence:   . Fear of Current or Ex-Partner: Not on file  . Emotionally Abused: Not on file  . Physically Abused: Not on file  . Sexually Abused: Not on file    No Known Allergies  Family History  Problem Relation Age of Onset  . Diabetes Mother   . Hyperlipidemia Sister   . Hypertension Sister     Prior to Admission medications   Medication Sig Start Date  End Date Taking? Authorizing Provider  amLODipine (NORVASC) 5 MG tablet Take 1 tablet (5 mg total) by mouth daily. 01/05/19  Yes [place], Peter M, MD  ANORO ELLIPTA 62.5-25 MCG/INH AEPB INHALE 1 PUFF BY MOUTH ONCE DAILY . APPOINTMENT REQUIRED FOR FUTURE REFILLS Patient taking differently: Inhale 1 puff into the lungs daily.  07/31/19  Yes Rigoberto Noel, MD  apixaban (ELIQUIS) 5 MG TABS tablet Take 1 tablet (5 mg total) by mouth 2 (two) times daily. 12/19/19  Yes [place], Peter M, MD  carvedilol (COREG) 12.5 MG tablet TAKE 1 TABLET TWICE DAILY WITH MEALS Patient taking differently: Take 12.5 mg by mouth 2 (two) times a day.  01/05/19  Yes [place], Peter  M, MD  furosemide (LASIX) 40 MG tablet TAKE 1 TABLET (40 MG TOTAL) BY MOUTH DAILY. 03/27/19  Yes [place], Peter M, MD  guaiFENesin (MUCINEX) 600 MG 12 hr tablet Take 2 tablets (1,200 mg total) by mouth 2 (two) times daily. Patient taking differently: Take 600 mg by mouth 2 (two) times daily.  11/30/18  Yes Lavina Hamman, MD  insulin glargine (LANTUS) 100 UNIT/ML injection Inject 15 Units into the skin at bedtime.   Yes [provider]  metFORMIN (GLUCOPHAGE) 500 MG tablet Take 500 mg by mouth 2 (two) times a day.    Yes [provider]  OXYGEN Inhale 4.5 L into the lungs continuous.   Yes [provider]  quinapril (ACCUPRIL) 10 MG tablet TAKE 1 TABLET (10 MG TOTAL) DAILY. Patient taking differently: Take 10 mg by mouth daily.  03/27/19  Yes [place], Peter M, MD  ACCU-CHEK FASTCLIX LANCETS MISC  07/30/18   [provider]  Accu-Chek FastClix Lancets MISC USE TO MONITOR BLOOD GLUCOSE 2 TIMES DAILY 11/06/19   [provider]  ACCU-CHEK GUIDE test strip  11/30/18   [provider]  BD PEN NEEDLE NANO U/F 32G X 4 MM MISC  08/06/18   [provider]  glucose blood (ACCU-CHEK GUIDE) test strip one strip (1 each dose) by Other route 2 (two) times daily. Test blood glucose 2 times a day. 11/21/19   [provider]  Insulin Pen Needle (BD PEN NEEDLE NANO U/F) 32G X 4 MM MISC USE TO INJECT INTO THE SKIN DAILY. 11/06/19   [provider]  pneumococcal 13-valent conjugate vaccine (PREVNAR 13) SUSP injection Inject into the muscle. 12/21/17   [provider]  rosuvastatin (CRESTOR) 40 MG tablet Take 1 tablet (40 mg total) by mouth daily. 12/01/19 02/29/20  [place], Peter M, MD  umeclidinium-vilanterol (ANORO ELLIPTA) 62.5-25 MCG/INH AEPB Inhale 1 puff into the lungs daily. 08/07/19   Lauraine Rinne, NP    Physical Exam: Vitals:   12/24/19 0500 12/24/19 0600 12/24/19 0615 12/24/19 0735  BP: (!) 148/65 (!) 132/56 (!) 144/60 (!) 137/55  Pulse: 63 68 65 70  Resp: (!) 21 (!) 22 (!) 21 (!) 23  SpO2: 91% (!) 75% 92% 91%  Weight:      Height:         . General:  Appears calm and comfortable and is NAD, mildly confused . Eyes:  PERRL, EOMI, normal lids, iris . ENT:  Hard of hearing, dry lips & tongue, dry mm; appears to have thrush - but also was given a whitish powder substance so may be residual  . Neck:  no LAD, masses or thyromegaly . Cardiovascular:  RRR, no m/r/g. No LE edema.  Marland Kitchen Respiratory:   CTA bilaterally with no wheezes/rales/rhonchi.  Mildly increased respiratory effort. . Abdomen:  soft, NT, ND, NABS . Skin:  no rash or induration seen on limited exam . Musculoskeletal:  grossly normal tone BUE/BLE, good ROM, no bony abnormality . Psychiatric:  grossly normal but mildly unfocused mood and affect, speech fluent and appropriate, AOx3, subtly inappropriate . Neurologic:  CN 2-12 grossly intact, moves all extremities in coordinated fashion    Radiological Exams on Admission: CT Renal Stone Study  Result Date: 12/24/2019 CLINICAL DATA:  Mid abdominal pain with vomiting and diarrhea. EXAM: CT ABDOMEN AND PELVIS WITHOUT CONTRAST TECHNIQUE: Multidetector CT imaging of the abdomen and pelvis was performed following the standard protocol without IV contrast. COMPARISON:   None. FINDINGS: Lower  chest: Heart is enlarged. No substantial pericardial effusion. Minimal left lower lobe collapse/consolidation noted. 5 mm right lower lobe pulmonary nodule visible on 37/5. Probable atelectasis in the paraspinal right lower lobe. Hepatobiliary: No focal abnormality in the liver on this study without intravenous contrast. There is no evidence for gallstones, gallbladder wall thickening, or pericholecystic fluid. No intrahepatic or extrahepatic biliary dilation. Pancreas: No focal mass lesion. No dilatation of the main duct. No intraparenchymal cyst. No peripancreatic edema. Spleen: No splenomegaly. No focal mass lesion. Adrenals/Urinary Tract: No adrenal nodule or mass. No stones are seen in either kidney or ureter. No bladder stones mild right renal atrophy noted. Stomach/Bowel: Stomach is distended with fluid. Duodenum is normally positioned as is the ligament of Treitz. No small bowel wall thickening. No small bowel dilatation. The terminal ileum is normal. The appendix is normal. No gross colonic mass. No colonic wall thickening. Diverticular changes are noted in the left colon without evidence of diverticulitis. Vascular/Lymphatic: Status post aorto bi femoral bypass graft. There is no gastrohepatic or hepatoduodenal ligament lymphadenopathy. No retroperitoneal or mesenteric lymphadenopathy. No pelvic sidewall lymphadenopathy. Reproductive: The prostate gland and seminal vesicles are unremarkable. Other: No intraperitoneal free fluid. Musculoskeletal: No worrisome lytic or sclerotic osseous abnormality. IMPRESSION: 1. No acute findings in the abdomen or pelvis. 2. Minimal left lower lobe collapse/consolidation. 3. 5 mm right lower lobe pulmonary nodule. No follow-up needed if patient is low-risk. Non-contrast chest CT can be considered in 12 months if patient is high-risk. This recommendation follows the consensus statement: Guidelines for Management of Incidental Pulmonary Nodules Detected  on CT Images: From the Fleischner Society 2017; Radiology 2017; 284:228-243. 4.  Aortic Atherosclerois (ICD10-170.0) Electronically Signed   By: Misty Stanley M.D.   On: 12/24/2019 07:10    EKG: Independently reviewed.  NSR with rate 68; nonspecific ST changes with no evidence of acute ischemia   Labs on Admission: I have personally reviewed the available labs and imaging studies at the time of the admission.  Pertinent labs:   K+ 6.8 CO2 8 Glucose 45, 120, 152 BUN 143/Creatinine 14.97/GFR 3; 21/1.57/52 on 2/4 Anion gap 38 Albumin 3.0 Lipase 71 Bili 2.5 Lactate 10.2 Normal CBC COVID pending   Assessment/Plan Principal Problem:   Acute renal failure (ARF) (HCC) Active Problems:   Hypertension   Chronic systolic CHF (congestive heart failure) (HCC)   Dyslipidemia   Aortoiliac occlusive disease (HCC)   COPD with chronic bronchitis and emphysema (HCC)   Chronic respiratory failure with hypoxia (HCC)   Lactic acidosis   Uremia   High anion gap metabolic acidosis   Atrial fibrillation, chronic (HCC)   Diabetes mellitus type 2, insulin dependent (HCC)   Hyperkalemia, diminished renal excretion   -Patient with known vasculopathy including CHF with EF as low as 20-25% in May 2012; moderate AS/AI; moderate pulm HTN; afib on Eliquis; and PAD s/p R fem-pop bypass -Per Dr. Doug Sou note on 2/4, he was lost to f/u and had not taken medication in over 2 years -He also has COPD on 4L home O2 chronically -He reports abdominal/chest pain and n/v/d x 1 week with inability to tolerate PO -He was found to be profoundly dehydrated with marked acute renal failure, metabolic and lactic acidosis, with high anion gap -While progressive care management could be considered, in this circumstance PCCM consultation for possible ICU admission has been requested -The patient may require HD catheter placement for possible acute vs. Chronic HD -He needs bicarbonate infusion -The source of his abdominal  pain is unclear - negative imaging but without contrast.   -Given his significant vasculopathy, ischemic colitis is a likely source of his discomfort but this typically presents with pain out of proportion to exam and his exam is pretty unremarkable at this time -For now, would suggest stool studies and ongoing monitoring with correction of severe metabolic dysfunction -Hyperkalemia may be treated with Lokelma, although HD may be needed; calcium gluconate should be likely given in the interim -For now, will defer admission since Dr. Tamala Julian from PCCM has agreed to accept the patient -[facility] will be happy to resume care once the patient is stabilized and no longer needing ICU level care     Note: This patient has been tested and is pending for the novel coronavirus COVID-19.   Total critical care time: 45 minutes Critical care time was exclusive of separately billable procedures and treating other patients. Critical care was necessary to treat or prevent imminent or life-threatening deterioration. Critical care was time spent personally by me on the following activities: development of treatment plan with patient and/or surrogate as well as nursing, discussions with consultants, evaluation of patient's response to treatment, examination of patient, obtaining history from patient or surrogate, ordering and performing treatments and interventions, ordering and review of laboratory studies, ordering and review of radiographic studies, pulse oximetry and re-evaluation of patient's condition.    Thank you for the opportunity to consult on this interesting patient.  PCCM will assume care at this time.     Karmen Bongo MD [facility]   How to contact the [facility] Attending or Consulting provider [place] or covering provider during after hours [place], for this patient?  1. Check the care team in [facility] and look for a) attending/consulting [facility] provider listed and b) the [facility] team listed 2. Log into  [email or web address] and use [place]'s universal password to access. If you do not have the password, please contact the hospital operator. 3. Locate the [facility] provider you are looking for under [facility] and page to a number that you can be directly reached. 4. If you still have difficulty reaching the provider, please page the [facility] (Director on Call) for the Hospitalists listed on amion for assistance.   12/24/2019, 8:01 AM

## 2019-12-24 NOTE — Consult Note (Signed)
NEURO HOSPITALIST CONSULT NOTE   Requestig physician: Dr. Tamala Julian  Reason for Consult: Worsening of AMS  History obtained from:  RN and Chart   HPI:                                                                                                                                          Kyle [name] is an 69 y.o. male with COPD, PAD, pulmonary HTN, s/p aorto-bifem bypass in 2016, and CHF with EF 20-25%, who presented to the hospital with acute renal failure. Nephrology saw the patient today with initial plan for volume replacement with observation to determine if he would void, in addition to possible HD. Per RN he was conversant today after AMS improved following admission from the ED. He was also able to eat. On RN assessment at 7:30 PM the patient was able to speak. At 8:50 a Code Stroke was called due to acute change in mental status. Assessment by rapid response revealed movement of LLE to pinch but no movement of RLE. Both upper extremities were flaccid with no movement to noxious. Just prior to rapid response assessment, the ICU physician noted spontaneous movement of LUE but no reaction to noxious. Glucose was low, but patient did not improve with D50. ABG showed a low pCO2 of 24.4 and low pO2 of 62, but O2 saturation was 91. He was sent down for STAT CT.   Home medications include Eliquis.   LKN: Unknown time prior to admission tPA given: No, unknown LKN  Past Medical History:  Diagnosis Date  . Aortic insufficiency    MODERATE WITH A BICUSPID AORTIC VAVLE  . Arterial occlusive disease    MULTILEVEL  . CHF (congestive heart failure) ([place])   . COPD (chronic obstructive pulmonary disease) ([place])   . Dilated cardiomyopathy (HCC)    WITH EJECTION FRACTION DOWN TO 20-25%--WITH CONGESTIVE HEART FAILURE  . Edema    LOWER EXTREMETIES  . Hypertension   . Normal coronary arteries 2009  . Orthopnea   . Peripheral arterial disease ([place])   . Pulmonary hypertension ([place])    . SOB (shortness of breath)     Past Surgical History:  Procedure Laterality Date  . ABDOMINAL AORTAGRAM N/A 11/05/2014   Procedure: ABDOMINAL Maxcine Ham;  Surgeon: Angelia Mould, MD;  Location[facility] CATH LAB;  Service: Cardiovascular;  Laterality: N/A;  . ABDOMINAL AORTOGRAM W/LOWER EXTREMITY Bilateral 04/21/2019   Procedure: ABDOMINAL AORTOGRAM W/LOWER EXTREMITY;  Surgeon: Angelia Mould, MD;  Location: [place] CV LAB;  Service: Cardiovascular;  Laterality: Bilateral;  . AORTA - BILATERAL FEMORAL ARTERY BYPASS GRAFT N/A 12/18/2014   Procedure: AORTOBIFEMORAL BYPASS GRAFT;  Surgeon: Angelia Mould, MD;  Location: [place];  Service: Vascular;  Laterality: N/A;  . CARDIAC CATHETERIZATION  2009  Nl Cors, EF 20%  . COLONOSCOPY    . ENDARTERECTOMY FEMORAL Left 12/18/2014   Procedure: ENDARTERECTOMY FEMORAL;  Surgeon: Angelia Mould, MD;  Location: [place];  Service: Vascular;  Laterality: Left;  . FALSE ANEURYSM REPAIR Right 04/24/2019   Procedure: REPAIR OF RIGHT FEMORAL ARTERY PSEUDOANEURYSM;  Surgeon: Rosetta Posner, MD;  Location: [place];  Service: Vascular;  Laterality: Right;  . FEMORAL-POPLITEAL BYPASS GRAFT  02/21/2011   right  . FEMORAL-TIBIAL BYPASS GRAFT Right 04/24/2019   Procedure: REDO BYPASS GRAFT RIGHT FEMORAL-TIBIAL ARTERY USING LEFT LEG VEIN & HEMASHIELD GOLD 2mm GRAFT;  Surgeon: Rosetta Posner, MD;  Location: [name];  Service: Vascular;  Laterality: Right;  . OTHER SURGICAL HISTORY     POST RIGHT FEMOROPOPLITEAL BYPASS GRAFT  . PERIPHERAL VASCULAR CATHETERIZATION  11/05/2014   Procedure: LOWER EXTREMITY ANGIOGRAPHY;  Surgeon: Angelia Mould, MD;  Location: [facility] CATH LAB;  Service: Cardiovascular;;  . VEIN HARVEST Left 04/24/2019   Procedure: LEFT LEG SAPHENOUS VEIN HARVEST;  Surgeon: Rosetta Posner, MD;  Location: [facility] OR;  Service: Vascular;  Laterality: Left;    Family History  Problem Relation Age of Onset  . Diabetes Mother   . Hyperlipidemia  Sister   . Hypertension Sister              Social History:  reports that he quit smoking about 14 months ago. He has a 40.00 pack-year smoking history. He has never used smokeless tobacco. He reports that he does not drink alcohol or use drugs.  No Known Allergies  MEDICATIONS:                                                                                                                     Prior to Admission:  Medications Prior to Admission  Medication Sig Dispense Refill Last Dose  . amLODipine (NORVASC) 5 MG tablet Take 1 tablet (5 mg total) by mouth daily. 90 tablet 3 12/23/2019 at Unknown time  . ANORO ELLIPTA 62.5-25 MCG/INH AEPB INHALE 1 PUFF BY MOUTH ONCE DAILY . APPOINTMENT REQUIRED FOR FUTURE REFILLS (Patient taking differently: Inhale 1 puff into the lungs daily. ) 60 each 0 12/23/2019 at Unknown time  . apixaban (ELIQUIS) 5 MG TABS tablet Take 1 tablet (5 mg total) by mouth 2 (two) times daily. 180 tablet 3 12/23/2019 at Unknown time  . carvedilol (COREG) 12.5 MG tablet TAKE 1 TABLET TWICE DAILY WITH MEALS (Patient taking differently: Take 12.5 mg by mouth 2 (two) times a day. ) 180 tablet 3 12/23/2019 at 1000  . furosemide (LASIX) 40 MG tablet TAKE 1 TABLET (40 MG TOTAL) BY MOUTH DAILY. 90 tablet 6 12/23/2019 at Unknown time  . guaiFENesin (MUCINEX) 600 MG 12 hr tablet Take 2 tablets (1,200 mg total) by mouth 2 (two) times daily. (Patient taking differently: Take 600 mg by mouth 2 (two) times daily. ) 30 tablet 0 12/23/2019 at Unknown time  . insulin glargine (LANTUS) 100 UNIT/ML injection Inject 15 Units into  the skin at bedtime.   12/23/2019 at Unknown time  . metFORMIN (GLUCOPHAGE) 500 MG tablet Take 500 mg by mouth 2 (two) times a day.    12/23/2019 at Unknown time  . OXYGEN Inhale 4.5 L into the lungs continuous.     . quinapril (ACCUPRIL) 10 MG tablet TAKE 1 TABLET (10 MG TOTAL) DAILY. (Patient taking differently: Take 10 mg by mouth daily. ) 90 tablet 3 12/23/2019 at Unknown time   . ACCU-CHEK FASTCLIX LANCETS MISC      . Accu-Chek FastClix Lancets MISC USE TO MONITOR BLOOD GLUCOSE 2 TIMES DAILY     . ACCU-CHEK GUIDE test strip      . BD PEN NEEDLE NANO U/F 32G X 4 MM MISC      . glucose blood (ACCU-CHEK GUIDE) test strip one strip (1 each dose) by Other route 2 (two) times daily. Test blood glucose 2 times a day.     . Insulin Pen Needle (BD PEN NEEDLE NANO U/F) 32G X 4 MM MISC USE TO INJECT INTO THE SKIN DAILY.     Marland Kitchen pneumococcal 13-valent conjugate vaccine (PREVNAR 13) SUSP injection Inject into the muscle.     . rosuvastatin (CRESTOR) 40 MG tablet Take 1 tablet (40 mg total) by mouth daily. 90 tablet 3   . umeclidinium-vilanterol (ANORO ELLIPTA) 62.5-25 MCG/INH AEPB Inhale 1 puff into the lungs daily. 180 each 2    Scheduled: . albuterol  10 mg Nebulization Once  . Chlorhexidine Gluconate Cloth  6 each Topical Daily  . [START ON 12/25/2019] Chlorhexidine Gluconate Cloth  6 each Topical Q0600  . insulin aspart  5 Units Intravenous Once   And  . dextrose  1 ampule Intravenous Once  . dextrose      . guaiFENesin  600 mg Oral BID  . heparin  5,000 Units Subcutaneous Q8H  . insulin aspart  0-15 Units Subcutaneous TID WC  . insulin aspart  0-5 Units Subcutaneous QHS  . mouth rinse  15 mL Mouth Rinse BID  . sodium bicarbonate 150 mEq in dextrose 5% 1000 mL  150 mEq Intravenous Once  . umeclidinium-vilanterol  1 puff Inhalation Daily   Continuous: .  sodium bicarbonate (isotonic) infusion in sterile water 150 mL/hr at 12/24/19 1808     ROS:                                                                                                                                       Unable to obtain due to AMS.   Blood pressure (!) 160/74, pulse 77, temperature (!) 97.4 F (36.3 C), temperature source Oral, resp. rate (!) 26, height 5\' 11"  (1.803 m), weight 71.3 kg, SpO2 92 %.   General Examination:  Physical Exam  HEENT-  [place]/AT. Mucus present on lips. Lungs- Some degree of labored respirations at times. May be Cheyne-Stokes pattern Extremities- Warm and well perfused  Neurological Examination Mental Status: Eyes open with no responses to questions or commands. Nonverbal. No attempts to communicate nonverbally. Will not gaze towards or away from visual stimuli.  Cranial Nerves: II: No blink to threat bilaterally. PERRL.  III,IV, VI: Intact doll's eye reflex. Eyes conjugately at the midline. Will not gaze towards or away from visual stimuli.  V,VII: Does not grimace to noxious. No facial droop. Slight reaction to brow ridge pressure bilaterally  VIII: No response to voice IX,X: Unable to assess XI: Head is midline XII: Unable to assess [place]/Sensory: Flaccid BUE with no movement to sternal rub or arm pinch bilaterally.  Brisk withdrawal of BLE to plantar stimulation with no asymmetry. During brisk withdrawal, the patient is able to elevate each lower extremity antigravity.  Deep Tendon Reflexes: 3+ bilateral brachioradialis, biceps and patellae. Toes upgoing bilaterally.  Cerebellar/Gait: Unable to assess.     Lab Results: Basic Metabolic Panel: Recent Labs  Lab 12/24/19 0419 12/24/19 0419 12/24/19 1005 12/24/19 1445 12/24/19 2035 12/24/19 2051  NA 143  --  141 142 141 138  K 6.8*  --  6.3* 6.4* 6.7* 6.4*  CL 97*  --  98 101 95*  --   CO2 8*  --  8* 10* 13*  --   GLUCOSE 45*  --  254* 112* 93  --   BUN 143*  --  147* 149* 149*  --   CREATININE 14.97*  --  14.45* 14.23* 13.31*  --   CALCIUM 9.3   < > 8.4* 8.4* 7.8*  --    < > = values in this interval not displayed.    CBC: Recent Labs  Lab 12/24/19 0419 12/24/19 2051  WBC 8.8  --   HGB 15.1 12.6*  HCT 46.8 37.0*  MCV 90.9  --   PLT 363  --     Cardiac Enzymes: Recent Labs  Lab 12/24/19 0419  CKTOTAL 321    Lipid Panel: No results for input(s): CHOL, TRIG, HDL, CHOLHDL,  VLDL, LDLCALC in the last 168 hours.  Imaging: US RENAL  Result Date: 12/24/2019 CLINICAL DATA:  Chronic kidney disease. EXAM: RENAL / URINARY TRACT ULTRASOUND COMPLETE COMPARISON:  CT 12/24/2019 FINDINGS: Right Kidney: Renal measurements: 8.6 x 3.9 x 4.7 cm = volume: 82 mL. Increased renal echogenicity. Left Kidney: Renal measurements: 11.3 x 5.6 x 5.8 cm = volume: 196 mL. Increased renal echogenicity. Bladder: Appears normal for degree of bladder distention. Other: Trace left perinephric fluid identified. Mild limitations secondary to patient's shortness of breath. IMPRESSION: 1. Increased renal echogenicity, suggesting medical renal disease. Mildly small right kidney relative to the left. 2. Trace left perinephric fluid, likely related to renal insufficiency. Electronically Signed   By: Abigail Miyamoto M.D.   On: 12/24/2019 14:49   DG Chest Port 1 View  Result Date: 12/24/2019 CLINICAL DATA:  Central line placement EXAM: PORTABLE CHEST 1 VIEW COMPARISON:  12/24/2019 at 2:09 p.m. FINDINGS: 6:10 p.m. Right internal jugular line tip at high SVC, without pneumothorax. Midline trachea. Borderline cardiomegaly. No pleural effusion or pneumothorax. Hyperinflation and interstitial thickening. Mild scarring or subsegmental atelectasis at the left lung base. IMPRESSION: Hyperinflation and chronic interstitial thickening, consistent with COPD/chronic bronchitis. New right internal jugular line, without pneumothorax. Electronically Signed   By: Abigail Miyamoto M.D.   On: 12/24/2019 18:38   DG  CHEST PORT 1 VIEW  Result Date: 12/24/2019 CLINICAL DATA:  Acute kidney injury and shortness of breath. EXAM: PORTABLE CHEST 1 VIEW COMPARISON:  11/22/2018 and prior radiographs FINDINGS: Cardiomegaly, pulmonary vascular congestion and mild interstitial opacities noted. Mild bibasilar atelectasis is present. No definite pleural effusion or pneumothorax. No acute bony abnormality noted. IMPRESSION: Cardiomegaly with pulmonary  vascular congestion and question mild interstitial edema. Electronically Signed   By: Margarette Canada M.D.   On: 12/24/2019 14:48   CT Renal Stone Study  Result Date: 12/24/2019 CLINICAL DATA:  Mid abdominal pain with vomiting and diarrhea. EXAM: CT ABDOMEN AND PELVIS WITHOUT CONTRAST TECHNIQUE: Multidetector CT imaging of the abdomen and pelvis was performed following the standard protocol without IV contrast. COMPARISON:  None. FINDINGS: Lower chest: Heart is enlarged. No substantial pericardial effusion. Minimal left lower lobe collapse/consolidation noted. 5 mm right lower lobe pulmonary nodule visible on 37/5. Probable atelectasis in the paraspinal right lower lobe. Hepatobiliary: No focal abnormality in the liver on this study without intravenous contrast. There is no evidence for gallstones, gallbladder wall thickening, or pericholecystic fluid. No intrahepatic or extrahepatic biliary dilation. Pancreas: No focal mass lesion. No dilatation of the main duct. No intraparenchymal cyst. No peripancreatic edema. Spleen: No splenomegaly. No focal mass lesion. Adrenals/Urinary Tract: No adrenal nodule or mass. No stones are seen in either kidney or ureter. No bladder stones mild right renal atrophy noted. Stomach/Bowel: Stomach is distended with fluid. Duodenum is normally positioned as is the ligament of Treitz. No small bowel wall thickening. No small bowel dilatation. The terminal ileum is normal. The appendix is normal. No gross colonic mass. No colonic wall thickening. Diverticular changes are noted in the left colon without evidence of diverticulitis. Vascular/Lymphatic: Status post aorto bi femoral bypass graft. There is no gastrohepatic or hepatoduodenal ligament lymphadenopathy. No retroperitoneal or mesenteric lymphadenopathy. No pelvic sidewall lymphadenopathy. Reproductive: The prostate gland and seminal vesicles are unremarkable. Other: No intraperitoneal free fluid. Musculoskeletal: No worrisome lytic  or sclerotic osseous abnormality. IMPRESSION: 1. No acute findings in the abdomen or pelvis. 2. Minimal left lower lobe collapse/consolidation. 3. 5 mm right lower lobe pulmonary nodule. No follow-up needed if patient is low-risk. Non-contrast chest CT can be considered in 12 months if patient is high-risk. This recommendation follows the consensus statement: Guidelines for Management of Incidental Pulmonary Nodules Detected on CT Images: From the Fleischner Society 2017; Radiology 2017; 284:228-243. 4.  Aortic Atherosclerois (ICD10-170.0) Electronically Signed   By: Misty Stanley M.D.   On: 12/24/2019 07:10   CT HEAD CODE STROKE WO CONTRAST`  Result Date: 12/24/2019 CLINICAL DATA:  Code stroke. Altered mental status. Bilateral lower extremity weakness. EXAM: CT HEAD WITHOUT CONTRAST TECHNIQUE: Contiguous axial images were obtained from the base of the skull through the vertex without intravenous contrast. COMPARISON:  11/20/2018 FINDINGS: Brain: No evidence of accelerated brain atrophy. No evidence of old or acute small or large vessel infarction. No mass lesion, hemorrhage, hydrocephalus or extra-axial collection. Vascular: There is atherosclerotic calcification of the major vessels at the base of the brain. Skull: Negative Sinuses/Orbits: Clear sinuses. Orbits negative. Old medial wall blowout fracture on the right. Other: None ASPECTS ([place] Stroke Program Early CT Score) - Ganglionic level infarction (caudate, lentiform nuclei, internal capsule, insula, M1-M3 cortex): 7 - Supraganglionic infarction (M4-M6 cortex): 3 Total score (0-10 with 10 being normal): 10 IMPRESSION: 1. No acute finding. Normal appearing brain for age. Extensive atherosclerotic calcification of the major vessels at the base of the brain. 2. ASPECTS  is 10 3. These results were communicated to Dr. Cheral Marker at 9:16 pmon 2/28/2021by text page via the [facility] messaging system. Electronically Signed   By: Nelson Chimes M.D.   On: 12/24/2019  21:18    Assessment: 69 year old male who presented to the hospital today with acute renal failure and AMS. Had sudden onset of worsened AMS with decreased movement, for which a Code Stroke was called.  1. CT head with no acute abnormality.  2. Acute worsening most likely secondary to severe metabolic encephalopathy in the setting of ARF. Stroke and subclinical seizure are also possible.   Recommendations: 1. EEG (ordered) 2. MRI brain with MRA head STAT (ordered) 3. Expedited dialysis.   50 minutes spent in the emergent neurological evaluation and management of this critically ill patient.   Electronically signed: Dr. Kerney Elbe 12/24/2019, 9:30 PM

## 2019-12-24 NOTE — Progress Notes (Signed)
Stat EEG completed, results pending. Dr. Hortense Ramal, Dr. Cheral Marker notified.

## 2019-12-24 NOTE — Progress Notes (Signed)
Shift report and hand off done followed by bedside pt observation with day shift nurse. Pt resting/sleeping while lying on right side and hands under head. Pt briefly opened eyes and verbalized to day shift nurse voice. Pt verbalized "okay" and closed eyes again when day shift nurse introduced me as the oncoming nurse. Pt vss, skin w&d, rr unlabored.

## 2019-12-25 ENCOUNTER — Inpatient Hospital Stay (HOSPITAL_COMMUNITY): Payer: Medicare HMO

## 2019-12-25 DIAGNOSIS — N170 Acute kidney failure with tubular necrosis: Secondary | ICD-10-CM

## 2019-12-25 DIAGNOSIS — J9601 Acute respiratory failure with hypoxia: Secondary | ICD-10-CM

## 2019-12-25 DIAGNOSIS — G9340 Encephalopathy, unspecified: Secondary | ICD-10-CM

## 2019-12-25 DIAGNOSIS — J9600 Acute respiratory failure, unspecified whether with hypoxia or hypercapnia: Secondary | ICD-10-CM

## 2019-12-25 DIAGNOSIS — E872 Acidosis: Secondary | ICD-10-CM

## 2019-12-25 LAB — AMMONIA: Ammonia: 56 umol/L — ABNORMAL HIGH (ref 9–35)

## 2019-12-25 LAB — CBC
HCT: 37.5 % — ABNORMAL LOW (ref 39.0–52.0)
HCT: 35.4 % — ABNORMAL LOW (ref 39.0–52.0)
Hemoglobin: 13.1 g/dL (ref 13.0–17.0)
Hemoglobin: 12.5 g/dL — ABNORMAL LOW (ref 13.0–17.0)
MCH: 29.8 pg (ref 26.0–34.0)
MCH: 29 pg (ref 26.0–34.0)
MCHC: 34.9 g/dL (ref 30.0–36.0)
MCHC: 35.3 g/dL (ref 30.0–36.0)
MCV: 85.2 fL (ref 80.0–100.0)
MCV: 82.1 fL (ref 80.0–100.0)
Platelets: 290 10*3/uL (ref 150–400)
Platelets: 261 10*3/uL (ref 150–400)
RBC: 4.4 MIL/uL (ref 4.22–5.81)
RBC: 4.31 MIL/uL (ref 4.22–5.81)
RDW: 15.2 % (ref 11.5–15.5)
RDW: 14.6 % (ref 11.5–15.5)
WBC: 14.1 10*3/uL — ABNORMAL HIGH (ref 4.0–10.5)
WBC: 12.5 10*3/uL — ABNORMAL HIGH (ref 4.0–10.5)
nRBC: 0 % (ref 0.0–0.2)
nRBC: 0 % (ref 0.0–0.2)

## 2019-12-25 LAB — POTASSIUM
Potassium: 4.2 mmol/L (ref 3.5–5.1)
Potassium: 3.8 mmol/L (ref 3.5–5.1)
Potassium: 3.8 mmol/L (ref 3.5–5.1)
Potassium: 3.7 mmol/L (ref 3.5–5.1)
Potassium: 4.1 mmol/L (ref 3.5–5.1)

## 2019-12-25 LAB — POCT I-STAT 7, (LYTES, BLD GAS, ICA,H+H)
Acid-base deficit: 3 mmol/L — ABNORMAL HIGH (ref 0.0–2.0)
Bicarbonate: 19.8 mmol/L — ABNORMAL LOW (ref 20.0–28.0)
Calcium, Ion: 0.95 mmol/L — ABNORMAL LOW (ref 1.15–1.40)
HCT: 38 % — ABNORMAL LOW (ref 39.0–52.0)
Hemoglobin: 12.9 g/dL — ABNORMAL LOW (ref 13.0–17.0)
O2 Saturation: 100 %
Patient temperature: 97.3
Potassium: 4 mmol/L (ref 3.5–5.1)
Sodium: 136 mmol/L (ref 135–145)
TCO2: 21 mmol/L — ABNORMAL LOW (ref 22–32)
pCO2 arterial: 27.8 mmHg — ABNORMAL LOW (ref 32.0–48.0)
pH, Arterial: 7.457 — ABNORMAL HIGH (ref 7.350–7.450)
pO2, Arterial: 215 mmHg — ABNORMAL HIGH (ref 83.0–108.0)

## 2019-12-25 LAB — RENAL FUNCTION PANEL
Albumin: 2.5 g/dL — ABNORMAL LOW (ref 3.5–5.0)
Anion gap: 27 — ABNORMAL HIGH (ref 5–15)
BUN: 90 mg/dL — ABNORMAL HIGH (ref 8–23)
CO2: 18 mmol/L — ABNORMAL LOW (ref 22–32)
Calcium: 7.8 mg/dL — ABNORMAL LOW (ref 8.9–10.3)
Chloride: 95 mmol/L — ABNORMAL LOW (ref 98–111)
Creatinine, Ser: 8.87 mg/dL — ABNORMAL HIGH (ref 0.61–1.24)
GFR calc Af Amer: 6 mL/min — ABNORMAL LOW (ref >60)
GFR calc non Af Amer: 6 mL/min — ABNORMAL LOW (ref >60)
Glucose, Bld: 105 mg/dL — ABNORMAL HIGH (ref 70–99)
Phosphorus: 5.5 mg/dL — ABNORMAL HIGH (ref 2.5–4.6)
Potassium: 4.2 mmol/L (ref 3.5–5.1)
Sodium: 140 mmol/L (ref 135–145)

## 2019-12-25 LAB — HEPATITIS PANEL, ACUTE
HCV Ab: NONREACTIVE
Hep A IgM: NONREACTIVE
Hep B C IgM: NONREACTIVE
Hepatitis B Surface Ag: NONREACTIVE

## 2019-12-25 LAB — GLUCOSE, CAPILLARY
Glucose-Capillary: 94 mg/dL (ref 70–99)
Glucose-Capillary: 105 mg/dL — ABNORMAL HIGH (ref 70–99)
Glucose-Capillary: 99 mg/dL (ref 70–99)
Glucose-Capillary: 82 mg/dL (ref 70–99)
Glucose-Capillary: 88 mg/dL (ref 70–99)
Glucose-Capillary: 103 mg/dL — ABNORMAL HIGH (ref 70–99)
Glucose-Capillary: 102 mg/dL — ABNORMAL HIGH (ref 70–99)

## 2019-12-25 LAB — HEPATIC FUNCTION PANEL
ALT: 27 U/L (ref 0–44)
AST: 37 U/L (ref 15–41)
Albumin: 2.5 g/dL — ABNORMAL LOW (ref 3.5–5.0)
Alkaline Phosphatase: 53 U/L (ref 38–126)
Bilirubin, Direct: <0.1 mg/dL (ref 0.0–0.2)
Total Bilirubin: 1.1 mg/dL (ref 0.3–1.2)
Total Protein: 6 g/dL — ABNORMAL LOW (ref 6.5–8.1)

## 2019-12-25 LAB — LACTIC ACID, PLASMA
Lactic Acid, Venous: 2.8 mmol/L (ref 0.5–1.9)
Lactic Acid, Venous: 2.8 mmol/L (ref 0.5–1.9)

## 2019-12-25 LAB — PHOSPHORUS: Phosphorus: 5.4 mg/dL — ABNORMAL HIGH (ref 2.5–4.6)

## 2019-12-25 LAB — CK: Total CK: 519 U/L — ABNORMAL HIGH (ref 49–397)

## 2019-12-25 LAB — HEPATITIS B CORE ANTIBODY, TOTAL: Hep B Core Total Ab: NONREACTIVE

## 2019-12-25 LAB — MAGNESIUM: Magnesium: 2 mg/dL (ref 1.7–2.4)

## 2019-12-25 MED ORDER — PANTOPRAZOLE SODIUM 40 MG IV SOLR
40.0000 mg | Freq: Two times a day (BID) | INTRAVENOUS | Status: DC
  Administered 2019-12-25 – 2019-12-26 (×4): 40 mg via INTRAVENOUS
  Filled 2019-12-25 (×5): qty 40

## 2019-12-25 MED ORDER — CARVEDILOL 12.5 MG PO TABS
12.5000 mg | ORAL_TABLET | Freq: Two times a day (BID) | ORAL | Status: DC
  Administered 2019-12-26: 12.5 mg
  Filled 2019-12-25 (×3): qty 1

## 2019-12-25 MED ORDER — AMLODIPINE BESYLATE 5 MG PO TABS
5.0000 mg | ORAL_TABLET | Freq: Every day | ORAL | Status: DC
  Administered 2019-12-26: 5 mg
  Filled 2019-12-25 (×2): qty 1

## 2019-12-25 MED ORDER — HYDRALAZINE HCL 20 MG/ML IJ SOLN: 10.0000 mg | Freq: Four times a day (QID) | INTRAMUSCULAR | Status: DC | PRN

## 2019-12-25 MED ORDER — ROCURONIUM BROMIDE 50 MG/5ML IV SOLN
70.0000 mg | Freq: Once | INTRAVENOUS | Status: AC
  Administered 2019-12-25: 70 mg via INTRAVENOUS
  Filled 2019-12-25: qty 7

## 2019-12-25 MED ORDER — PANTOPRAZOLE SODIUM 40 MG PO PACK
40.0000 mg | PACK | Freq: Every day | ORAL | Status: DC
  Filled 2019-12-25: qty 20

## 2019-12-25 MED ORDER — ETOMIDATE 2 MG/ML IV SOLN
20.0000 mg | Freq: Once | INTRAVENOUS | Status: AC
  Administered 2019-12-25: 20 mg via INTRAVENOUS

## 2019-12-25 MED ORDER — IPRATROPIUM-ALBUTEROL 0.5-2.5 (3) MG/3ML IN SOLN: 3.0000 mL | Freq: Four times a day (QID) | RESPIRATORY_TRACT | Status: DC | PRN

## 2019-12-25 MED ORDER — FENTANYL CITRATE (PF) 100 MCG/2ML IJ SOLN: 25.0000 ug | INTRAMUSCULAR | Status: DC | PRN

## 2019-12-25 MED ORDER — FENTANYL CITRATE (PF) 100 MCG/2ML IJ SOLN
25.0000 ug | INTRAMUSCULAR | Status: DC | PRN
  Administered 2019-12-25 (×3): 100 ug via INTRAVENOUS
  Administered 2019-12-25: 50 ug via INTRAVENOUS
  Administered 2019-12-26 (×4): 100 ug via INTRAVENOUS
  Filled 2019-12-25 (×8): qty 2

## 2019-12-25 MED ORDER — CHLORHEXIDINE GLUCONATE 0.12% ORAL RINSE (MEDLINE KIT)
15.0000 mL | Freq: Two times a day (BID) | OROMUCOSAL | Status: DC
  Administered 2019-12-25 – 2019-12-26 (×2): 15 mL via OROMUCOSAL

## 2019-12-25 MED ORDER — CHLORHEXIDINE GLUCONATE 0.12 % MT SOLN
15.0000 mL | Freq: Two times a day (BID) | OROMUCOSAL | Status: DC
  Administered 2019-12-25: 15 mL via OROMUCOSAL

## 2019-12-25 MED ORDER — ORAL CARE MOUTH RINSE
15.0000 mL | OROMUCOSAL | Status: DC
  Administered 2019-12-25 – 2019-12-26 (×8): 15 mL via OROMUCOSAL

## 2019-12-25 MED ORDER — LORAZEPAM 2 MG/ML IJ SOLN
2.0000 mg | INTRAMUSCULAR | Status: AC
  Administered 2019-12-25: 2 mg via INTRAVENOUS
  Filled 2019-12-25: qty 1

## 2019-12-25 MED ORDER — INSULIN ASPART 100 UNIT/ML ~~LOC~~ SOLN
0.0000 [IU] | SUBCUTANEOUS | Status: DC
  Administered 2019-12-26: 3 [IU] via SUBCUTANEOUS
  Administered 2019-12-26 (×2): 2 [IU] via SUBCUTANEOUS
  Administered 2019-12-27: 3 [IU] via SUBCUTANEOUS

## 2019-12-25 NOTE — Progress Notes (Signed)
NAME:  Kyle [name], MRN:  [id number], DOB:  1951/01/29, LOS: 1 ADMISSION DATE:  12/24/2019, CONSULTATION DATE:  12/24/19 REFERRING MD:  ER, CHIEF COMPLAINT:  Abd pain   Brief History   69 year old man with a history of heavy smoking, COPD, peripheral vascular disease presenting with nausea, intermittent vomiting, and diarrhea for 1 week found to have acute renal failure and severe lactic acidosis of unclear etiology.  History of present illness   69 year old man with a history of heavy smoking, COPD, IDDM, peripheral vascular disease presenting with nausea, intermittent vomiting, and diarrhea for 1 week found to have acute renal failure and severe lactic acidosis of unclear etiology.  He denies any sick contacts.  Has a chronic dry cough.  He has been using his Anoro.  Has been using double his prescribed amount of rosuvastatin.  Noted to have severe hyperkalemia, metabolic acidosis, lactic acidosis.  His belly pain is cramping, mid epigastric in origin, and radiates to the chest.  Past Medical History  COPD on home oxygen Severe peripheral vascular disease History of heavy smoking Insulin-dependent diabetes  Significant Hospital Events   2/28 Admitted 3/1 Intubated overnight due to worsening mental status  Consults:  Nephrology, Hospitalist, PCCM  Procedures:  N/A  Significant Diagnostic Tests:  COVID>> CT Renal IMPRESSION: 1. No acute findings in the abdomen or pelvis. 2. Minimal left lower lobe collapse/consolidation. 3. 5 mm right lower lobe pulmonary nodule. No follow-up needed if patient is low-risk. Non-contrast chest CT can be considered in 12 months if patient is high-risk. This recommendation follows the consensus statement: Guidelines for Management of Incidental Pulmonary Nodules Detected on CT Images: From the Fleischner Society 2017; Radiology 2017; 284:228-243. 4.  Aortic Atherosclerois (ICD10-170.0)  Micro Data:  COVID >> negative  Antimicrobials:  N/A    Interim history/subjective:  Intubated overnight. On minimal vent settings. Opens eyes but not interactive  Objective   Blood pressure (!) 155/65, pulse 79, temperature 98.2 F (36.8 C), temperature source Oral, resp. rate 12, height 5\' 11"  (1.803 m), weight 72 kg, SpO2 91 %.    Vent Mode: PSV;CPAP FiO2 (%):  [40 %-100 %] 40 % Set Rate:  [15 bmp-25 bmp] 15 bmp Vt Set:  [500 mL-600 mL] 500 mL PEEP:  [8 cmH20] 8 cmH20 Pressure Support:  [8 cmH20] 8 cmH20 Plateau Pressure:  [12 cmH20-22 cmH20] 12 cmH20   Intake/Output Summary (Last 24 hours) at 12/25/2019 1415 Last data filed at 12/25/2019 1000 Gross per 24 hour  Intake 4002.96 ml  Output 1360 ml  Net 2642.96 ml   Filed Weights   12/24/19 0404 12/24/19 1402 12/24/19 2245  Weight: 80 kg 71.3 kg 72 kg   Physical Exam: General: Chronically ill and thin-appearing, no acute distress HENT: [place], AT, OP clear, MMM, EEG leads in place Eyes: EOMI, no scleral icterus Respiratory: Diminished breath sounds bilaterally.  No crackles, wheezing or rales Cardiovascular: RRR, -M/R/G, no JVD GI: BS+, soft, nontender Extremities:-Edema,-tenderness Neuro: Opens eyes to voice, no tracking, not awake or alert, does not follow commands, withdraws extremities x 4 Skin: Intact, no rashes or bruising  Resolved Hospital Problem list   N/A  Assessment & Plan:   Acute encephalopathy Unclear etiology. Became more obtunded overnight and encephalopathic this morning. CT head and MRI negative for acute findings. Ammonia 56 Plan --F/u EEG --Neurology following.  Acute hypoxemic respiratory failure COPD - on Anoro Plan --Full vent support --Daily SBT/WUA. Extubation precluded by mental status --PRN Duoneb --VAP  Coffee-ground emesis Increased  output. Currently hemodynamically stable Plan --Check CBC --PPI BID  Lactic acidosis without hypotension N/V/D with abdominal pain Possible ischemic colitis in setting of hx of PAD however no acute  findings on CT abdomen/renal stone study --Trend LA --IVF resuscitation as needed  Acute renal failure with hyperkalemia - improving Stage III CKD Likely pre-renal in setting of poor PO and N/V/D. S/p temporizing measures and [facility] input. No acute indication for HD. Maintain catheter for now. --Hold nephrotoxic agents --Discontinue sodium bicarbonate, monitor serum bicarb and pH as needed --Strict I/O, foley  Atrial fibrillation PVD Dilated cardiomyopathy Aortic stenosis HTN --Hold apixan in setting of concern for GIB --Restart home coreg --Continue amlodipine --Holding home lasix, quinapril   IDDM, hypoglycemic on admission --SSI --Start tube feeds  RLL Lung nodule, 27mm --Follow-up in 12 months  Best practice:  Diet: Start TF Pain/Anxiety/Delirium protocol (if indicated): N/A VAP protocol (if indicated): N/A DVT prophylaxis: SCDs GI prophylaxis: N/A Glucose control: SSI Mobility: BR Code Status: Full Family Communication: Updated wife at bedside 3/1 Disposition: ICU  Labs   CBC: Recent Labs  Lab 12/24/19 0419 12/24/19 2051 12/25/19 0326 12/25/19 0425 12/25/19 1300  WBC 8.8  --   --  14.1* 12.5*  HGB 15.1 12.6* 12.9* 13.1 12.5*  HCT 46.8 37.0* 38.0* 37.5* 35.4*  MCV 90.9  --   --  85.2 82.1  PLT 363  --   --  290 0000000    Basic Metabolic Panel: Recent Labs  Lab 12/24/19 0419 12/24/19 0419 12/24/19 1005 12/24/19 1005 12/24/19 1445 12/24/19 1445 12/24/19 2035 12/24/19 2035 12/24/19 2051 12/25/19 0326 12/25/19 0425 12/25/19 1052 12/25/19 1157  NA 143   < > 141   < > 142  --  141  --  138 136 140  --   --   K 6.8*   < > 6.3*   < > 6.4*   < > 6.7*   < > 6.4* 4.0 4.2  4.2 3.8 3.8  CL 97*  --  98  --  101  --  95*  --   --   --  95*  --   --   CO2 8*  --  8*  --  10*  --  13*  --   --   --  18*  --   --   GLUCOSE 45*  --  254*  --  112*  --  93  --   --   --  105*  --   --   BUN 143*  --  147*  --  149*  --  149*   --   --   --  90*  --   --   CREATININE 14.97*  --  14.45*  --  14.23*  --  13.31*  --   --   --  8.87*  --   --   CALCIUM 9.3  --  8.4*  --  8.4*  --  7.8*  --   --   --  7.8*  --   --   MG  --   --   --   --   --   --   --   --   --   --  2.0  --   --   PHOS  --   --   --   --   --   --   --   --   --   --  5.5*  5.4*  --   --    < > = values in this interval not displayed.   GFR: Estimated Creatinine Clearance: 8.2 mL/min (A) (by C-G formula based on SCr of 8.87 mg/dL (H)). Recent Labs  Lab 12/24/19 0419 12/24/19 0600 12/24/19 0810 12/25/19 0425 12/25/19 1300  WBC 8.8  --   --  14.1* 12.5*  LATICACIDVEN  --  10.2* 9.3*  --   --     Liver Function Tests: Recent Labs  Lab 12/24/19 0419 12/25/19 0425  AST 36 37  ALT 28 27  ALKPHOS 65 53  BILITOT 2.5* 1.1  PROT 7.7 6.0*  ALBUMIN 3.0* 2.5*  2.5*   Recent Labs  Lab 12/24/19 0419  LIPASE 71*   Recent Labs  Lab 12/25/19 1052  AMMONIA 56*    ABG    Component Value Date/Time   PHART 7.457 (H) 12/25/2019 0326   PCO2ART 27.8 (L) 12/25/2019 0326   PO2ART 215.0 (H) 12/25/2019 0326   HCO3 19.8 (L) 12/25/2019 0326   TCO2 21 (L) 12/25/2019 0326   ACIDBASEDEF 3.0 (H) 12/25/2019 0326   O2SAT 100.0 12/25/2019 0326     Coagulation Profile: No results for input(s): INR, PROTIME in the last 168 hours.  Cardiac Enzymes: Recent Labs  Lab 12/24/19 0419 12/25/19 1157  CKTOTAL 321 519*    HbA1C: Hgb A1c MFr Bld  Date/Time Value Ref Range Status  12/24/2019 08:10 AM 8.0 (H) 4.8 - 5.6 % Final    Comment:    (NOTE) Pre diabetes:          5.7%-6.4% Diabetes:              >6.4% Glycemic control for   <7.0% adults with diabetes   11/30/2019 09:37 AM 7.6 (H) 4.8 - 5.6 % Final    Comment:             Prediabetes: 5.7 - 6.4          Diabetes: >6.4          Glycemic control for adults with diabetes: <7.0     CBG: Recent Labs  Lab 12/24/19 2218 12/25/19 0208 12/25/19 0425 12/25/19 0713 12/25/19 1105  GLUCAP  162* 94 105* 99 82   Critical care time: 32 minutes   The patient is critically ill with multiple organ systems failure and requires high complexity decision making for assessment and support, frequent evaluation and titration of therapies, application of advanced monitoring technologies and extensive interpretation of multiple databases.   Critical Care Time devoted to patient care services described in this note is 32 Minutes. This time reflects time of care of this signee Dr. Rodman Pickle.   Rodman Pickle, M.D. [facility] Pulmonary/Critical Care Medicine 12/25/2019 2:15 PM   Please see Amion for pager number to reach on-call Pulmonary and Critical Care Team.

## 2019-12-25 NOTE — Progress Notes (Signed)
Video call with family

## 2019-12-25 NOTE — Progress Notes (Signed)
Notified ct tech of code stroke per Dr Genevive Bi.

## 2019-12-25 NOTE — Progress Notes (Signed)
Pt.'s wife, Lucita Ferrara, took patient's clothes and shoes home with her. Dentures left at bedside for now.

## 2019-12-25 NOTE — Progress Notes (Signed)
Pt became lethargic and more difficult to arouse. Floor Rn Carlus Pavlov and Md Jacques Earthly along with respiratory staff intubated patient. HDTx ended early. Dr. Jonnie Finner notified.

## 2019-12-25 NOTE — Progress Notes (Addendum)
Pt on MRI table. Sodium bicarb drip paused for procedure by rapid response nurse. Nephrology md arrived to MRI technician room.

## 2019-12-25 NOTE — Progress Notes (Signed)
EEG completed, results pending. 

## 2019-12-25 NOTE — Procedures (Signed)
Patient Name: Kyle [name]  MRN: [id number]  Epilepsy Attending: Lora Havens  Referring Physician/Provider: Dr Karena Addison Aroor Date: 12/25/2019 Duration: 24.44 mins   Patient history: 69 year old male who presented to the hospital today with acute renal failure and AMS. Had sudden onset of worsened AMS with decreased movement. MRI Brain showed no acute finding. 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   Level of alertness: comatose   AEDs during EEG study: None   Technical aspects: This EEG study was done with scalp electrodes positioned according to the 10-20 International system of electrode placement. Electrical activity was acquired at a sampling rate of 500Hz  and reviewed with a high frequency filter of 70Hz  and a low frequency filter of 1Hz . EEG data were recorded continuously and digitally stored.    DESCRIPTION:  EEG showed continuous generalized and lateralized right hemisphere 2-3hz  rhythmic low amplitude delta slowing. Intermittent sharply contoured, generalized, maximal bifrontal 4-5hz  theta slowing was also noted. EEG was reactive to noxious stimulation.  Hyperventilation and photic stimulation were not performed.   ABNORMALITY - Continuous rhythmic slow, generalized   IMPRESSION: This study is suggestive of profound diffuse encephalopathy, non specific to etiology.  No seizures or epileptiform discharges were seen throughout the recording.  EEG appeared similar to last night.     Kyle [name] Kyle [name]

## 2019-12-25 NOTE — Progress Notes (Signed)
LTM EEG started with new set of leads. Neuro notified

## 2019-12-25 NOTE — Progress Notes (Addendum)
Pt transported back to room by this rn and rapid response nurse. Rapid response nurse received update by neuro md. This rn notes snoring respirations, elink md notified.

## 2019-12-25 NOTE — Procedures (Addendum)
Intubation Procedure Note DIANA GERSHON [id number] 09/18/1951  Procedure: Intubation Indications: Airway protection and maintenance  Evaluated patient at bedside and he has progressively become more obtunded and now unarousable to painful stimuli. Decision was made to intubate  Procedure Details Consent: Risks of procedure as well as the alternatives and risks of each were explained to the (patient/caregiver).  Consent for procedure obtained. Time Out: Verified patient identification, verified procedure, site/side was marked, verified correct patient position, special equipment/implants available, medications/allergies/relevent history reviewed, required imaging and test results available.  Performed  Maximum sterile technique was used including cap, gloves, gown, hand hygiene and mask.  4  Dentures removed Grade 1 view  1 Attempt Visualized ETT place through vocal cords CO2 detector with color change detected  Evaluation Hemodynamic Status: BP stable throughout; O2 sats: stable throughout Patient's Current Condition: stable Complications: No apparent complications Patient did tolerate procedure well. Chest X-ray ordered to verify placement.  CXR: pending.   Jacques Earthly 12/25/2019

## 2019-12-25 NOTE — Progress Notes (Signed)
[facility] Progress Note   69 y.o. 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 Has COPD on 4L home O2. In ED labs showed creat 14, K 6.3, on lasix and ace inhibitor at home. Pt admitted to [facility]. Asked to see for AKI; cr 1.47 on 11/30/19 w/ BL  cr from Jun 2020- feb 2021  1.4- 1.7.    Assessment/ Plan:   1. AoCKD 3 - in setting of vol depletion/ vomiting/ diarrhea and ACEi. B/l creat is 1.5. Tx for hyperkalemia and po K+ lowering meds; K better now. No indication for HD; would leave the cath in till Wed to ensure improving trend. Continue hydrating with isotonic fluids as tolerated and holding acei.  2. DM on insulin 3. Vol depletion - getting IVF's w/ bicarb, sp 1L NS. BP's were high overnight and hydralazine written for 4. Lactic / metabolic acidosis - severe, but not septic appearing on exam 5. COPD - on home O2, currently intubated 3/1  Subjective:   Blank stare seen by neurology with EEG suggestive of diffuse encephalopathy. Lethargic early AM and intubated 3/1.   Objective:   BP (!) 158/72   Pulse 79   Temp 97.7 F (36.5 C) (Oral)   Resp 18   Ht 5\' 11"  (1.803 m)   Wt 72 kg   SpO2 93%   BMI 22.14 kg/m   Intake/Output Summary (Last 24 hours) at 12/25/2019 0951 Last data filed at 12/25/2019 0636 Gross per 24 hour  Intake 4124.85 ml  Output 410 ml  Net 3714.85 ml   Weight change: -8.7 kg  Physical Exam: Gen in bed in ICU, intub, not responding to commands but eyes open No rash, cyanosis or gangrene No jvd or bruits Chest clear bilat to bases, no rales RRR no MRG Abd soft ntnd no mass or ascites, slightly protuberant MS no joint effusions or deformity Ext no LE edema, no wounds or ulcers Access: RIJ temp  Imaging: EEG  Result Date: 12/24/2019 Lora Havens, MD     12/24/2019 11:57 PM Patient Name: SHAQUON MCCLENNEY  MRN: [id number] Epilepsy Attending: Lora Havens Referring Physician/Provider: Dr Kerney Elbe Date: 12/24/2019 Duration: 25.25 mins Patient history: 69 year old male who presented to the hospital today with acute renal failure and AMS. Had sudden onset of worsened AMS with decreased movement. MRI Brain showed no acute finding. 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 Level of alertness: comatose AEDs during EEG study: None Technical aspects: This EEG study was done with scalp electrodes positioned according to the 10-20 International system of electrode placement. Electrical activity was acquired at a sampling rate of 500Hz  and reviewed with a high frequency filter of 70Hz  and a low frequency filter of 1Hz . EEG data were recorded continuously and digitally stored. DESCRIPTION:  EEG showed continuous generalized 2-3hz  rhythmic low amplitude delta slowing. Intermittent sharply contoured, generalized, maximal bifrontal 4-5hz  theta slowing was also noted. EEG was reactive to noxious stimulation.  Hyperventilation and photic stimulation were not performed. ABNORMALITY - Continuous rhythmic slow, generalized IMPRESSION: This study is suggestive of profound diffuse encephalopathy, non specific to etiology.  No seizures or epileptiform discharges were seen throughout the recording. Dr Cheral Marker was notifed Lora Havens   MR ANGIO HEAD WO CONTRAST  Result Date: 12/24/2019 CLINICAL DATA:  Altered mental  status. Bilateral lower extremity weakness. EXAM: MRI HEAD WITHOUT CONTRAST MRA HEAD WITHOUT CONTRAST TECHNIQUE: Multiplanar, multiecho pulse sequences of the brain and surrounding structures were obtained without intravenous contrast. Angiographic images of the head were obtained using MRA technique without contrast. COMPARISON:  Head CT same day. FINDINGS: MRI HEAD FINDINGS Brain: Diffusion imaging does not show any acute or subacute infarction. No abnormality is seen  affecting the brainstem or cerebellum. Cerebral hemispheres show symmetric old white matter infarctions in the posterior frontal white matter and an old tiny left posterior frontal cortical infarction. No large vessel territory infarction. No mass lesion, hemorrhage, hydrocephalus or extra-axial collection. Vascular: Major vessels at the base of the brain show flow. Skull and upper cervical spine: Negative Sinuses/Orbits: Clear/normal Other: None MRA HEAD FINDINGS Both internal carotid arteries are patent through the skull base and siphon regions. The anterior and middle cerebral vessels show flow. There is considerable motion degradation. That could potentially be multiple stenoses in the anterior and middle cerebral arteries. Both vertebral arteries are patent to the basilar. Posterior circulation branch vessels show flow. There could possibly be stenoses within the PCA branches. IMPRESSION: MRI head: No acute finding. 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 MRA head: Study is markedly degraded by motion. No large vessel occlusion. Potential for multiple stenoses in the anterior, middle and posterior cerebral artery territories. Detail markedly limited by motion. Electronically Signed   By: Nelson Chimes M.D.   On: 12/24/2019 22:31   MR BRAIN WO CONTRAST  Result Date: 12/24/2019 CLINICAL DATA:  Altered mental status. Bilateral lower extremity weakness. EXAM: MRI HEAD WITHOUT CONTRAST MRA HEAD WITHOUT CONTRAST TECHNIQUE: Multiplanar, multiecho pulse sequences of the brain and surrounding structures were obtained without intravenous contrast. Angiographic images of the head were obtained using MRA technique without contrast. COMPARISON:  Head CT same day. FINDINGS: MRI HEAD FINDINGS Brain: Diffusion imaging does not show any acute or subacute infarction. No abnormality is seen affecting the brainstem or  cerebellum. Cerebral hemispheres show symmetric old white matter infarctions in the posterior frontal white matter and an old tiny left posterior frontal cortical infarction. No large vessel territory infarction. No mass lesion, hemorrhage, hydrocephalus or extra-axial collection. Vascular: Major vessels at the base of the brain show flow. Skull and upper cervical spine: Negative Sinuses/Orbits: Clear/normal Other: None MRA HEAD FINDINGS Both internal carotid arteries are patent through the skull base and siphon regions. The anterior and middle cerebral vessels show flow. There is considerable motion degradation. That could potentially be multiple stenoses in the anterior and middle cerebral arteries. Both vertebral arteries are patent to the basilar. Posterior circulation branch vessels show flow. There could possibly be stenoses within the PCA branches. IMPRESSION: MRI head: No acute finding. 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 MRA head: Study is markedly degraded by motion. No large vessel occlusion. Potential for multiple stenoses in the anterior, middle and posterior cerebral artery territories. Detail markedly limited by motion. Electronically Signed   By: Nelson Chimes M.D.   On: 12/24/2019 22:31   US RENAL  Result Date: 12/24/2019 CLINICAL DATA:  Chronic kidney disease. EXAM: RENAL / URINARY TRACT ULTRASOUND COMPLETE COMPARISON:  CT 12/24/2019 FINDINGS: Right Kidney: Renal measurements: 8.6 x 3.9 x 4.7 cm = volume: 82 mL. Increased renal echogenicity. Left Kidney: Renal measurements: 11.3 x 5.6 x 5.8 cm = volume:  196 mL. Increased renal echogenicity. Bladder: Appears normal for degree of bladder distention. Other: Trace left perinephric fluid identified. Mild limitations secondary to patient's shortness of breath. IMPRESSION: 1. Increased renal echogenicity, suggesting medical renal disease.  Mildly small right kidney relative to the left. 2. Trace left perinephric fluid, likely related to renal insufficiency. Electronically Signed   By: Abigail Miyamoto M.D.   On: 12/24/2019 14:49   DG CHEST PORT 1 VIEW  Result Date: 12/25/2019 CLINICAL DATA:  Status post intubation EXAM: PORTABLE CHEST 1 VIEW COMPARISON:  12/24/2019 FINDINGS: Right jugular central line is again seen and stable. Endotracheal tube is noted 5 cm above the carina. Gastric catheter extends into the stomach. Cardiac shadow is stable. Mild aortic calcifications are seen. The lungs are hyperinflated consistent with COPD. No focal infiltrate is seen. IMPRESSION: Tubes and lines as described above. COPD without acute abnormality. Electronically Signed   By: Inez Catalina M.D.   On: 12/25/2019 01:35   DG Chest Port 1 View  Result Date: 12/24/2019 CLINICAL DATA:  Central line placement EXAM: PORTABLE CHEST 1 VIEW COMPARISON:  12/24/2019 at 2:09 p.m. FINDINGS: 6:10 p.m. Right internal jugular line tip at high SVC, without pneumothorax. Midline trachea. Borderline cardiomegaly. No pleural effusion or pneumothorax. Hyperinflation and interstitial thickening. Mild scarring or subsegmental atelectasis at the left lung base. IMPRESSION: Hyperinflation and chronic interstitial thickening, consistent with COPD/chronic bronchitis. New right internal jugular line, without pneumothorax. Electronically Signed   By: Abigail Miyamoto M.D.   On: 12/24/2019 18:38   DG CHEST PORT 1 VIEW  Result Date: 12/24/2019 CLINICAL DATA:  Acute kidney injury and shortness of breath. EXAM: PORTABLE CHEST 1 VIEW COMPARISON:  11/22/2018 and prior radiographs FINDINGS: Cardiomegaly, pulmonary vascular congestion and mild interstitial opacities noted. Mild bibasilar atelectasis is present. No definite pleural effusion or pneumothorax. No acute bony abnormality noted. IMPRESSION: Cardiomegaly with pulmonary vascular congestion and question mild interstitial edema. Electronically  Signed   By: Margarette Canada M.D.   On: 12/24/2019 14:48   CT Renal Stone Study  Result Date: 12/24/2019 CLINICAL DATA:  Mid abdominal pain with vomiting and diarrhea. EXAM: CT ABDOMEN AND PELVIS WITHOUT CONTRAST TECHNIQUE: Multidetector CT imaging of the abdomen and pelvis was performed following the standard protocol without IV contrast. COMPARISON:  None. FINDINGS: Lower chest: Heart is enlarged. No substantial pericardial effusion. Minimal left lower lobe collapse/consolidation noted. 5 mm right lower lobe pulmonary nodule visible on 37/5. Probable atelectasis in the paraspinal right lower lobe. Hepatobiliary: No focal abnormality in the liver on this study without intravenous contrast. There is no evidence for gallstones, gallbladder wall thickening, or pericholecystic fluid. No intrahepatic or extrahepatic biliary dilation. Pancreas: No focal mass lesion. No dilatation of the main duct. No intraparenchymal cyst. No peripancreatic edema. Spleen: No splenomegaly. No focal mass lesion. Adrenals/Urinary Tract: No adrenal nodule or mass. No stones are seen in either kidney or ureter. No bladder stones mild right renal atrophy noted. Stomach/Bowel: Stomach is distended with fluid. Duodenum is normally positioned as is the ligament of Treitz. No small bowel wall thickening. No small bowel dilatation. The terminal ileum is normal. The appendix is normal. No gross colonic mass. No colonic wall thickening. Diverticular changes are noted in the left colon without evidence of diverticulitis. Vascular/Lymphatic: Status post aorto bi femoral bypass graft. There is no gastrohepatic or hepatoduodenal ligament lymphadenopathy. No retroperitoneal or mesenteric lymphadenopathy. No pelvic sidewall lymphadenopathy. Reproductive: The prostate gland and seminal vesicles are unremarkable. Other: No intraperitoneal free fluid. Musculoskeletal: No  worrisome lytic or sclerotic osseous abnormality. IMPRESSION: 1. No acute findings in the  abdomen or pelvis. 2. Minimal left lower lobe collapse/consolidation. 3. 5 mm right lower lobe pulmonary nodule. No follow-up needed if patient is low-risk. Non-contrast chest CT can be considered in 12 months if patient is high-risk. This recommendation follows the consensus statement: Guidelines for Management of Incidental Pulmonary Nodules Detected on CT Images: From the Fleischner Society 2017; Radiology 2017; 284:228-243. 4.  Aortic Atherosclerois (ICD10-170.0) Electronically Signed   By: Misty Stanley M.D.   On: 12/24/2019 07:10   CT HEAD CODE STROKE WO CONTRAST`  Result Date: 12/24/2019 CLINICAL DATA:  Code stroke. Altered mental status. Bilateral lower extremity weakness. EXAM: CT HEAD WITHOUT CONTRAST TECHNIQUE: Contiguous axial images were obtained from the base of the skull through the vertex without intravenous contrast. COMPARISON:  11/20/2018 FINDINGS: Brain: No evidence of accelerated brain atrophy. No evidence of old or acute small or large vessel infarction. No mass lesion, hemorrhage, hydrocephalus or extra-axial collection. Vascular: There is atherosclerotic calcification of the major vessels at the base of the brain. Skull: Negative Sinuses/Orbits: Clear sinuses. Orbits negative. Old medial wall blowout fracture on the right. Other: None ASPECTS ([place] Stroke Program Early CT Score) - Ganglionic level infarction (caudate, lentiform nuclei, internal capsule, insula, M1-M3 cortex): 7 - Supraganglionic infarction (M4-M6 cortex): 3 Total score (0-10 with 10 being normal): 10 IMPRESSION: 1. No acute finding. Normal appearing brain for age. Extensive atherosclerotic calcification of the major vessels at the base of the brain. 2. ASPECTS is 10 3. These results were communicated to Dr. Cheral Marker at 9:16 pmon 2/28/2021by text page via the [facility] messaging system. Electronically Signed   By: Nelson Chimes M.D.   On: 12/24/2019 21:18    Labs: BMET Recent Labs  Lab 12/24/19 0419 12/24/19 1005  12/24/19 1445 12/24/19 2035 12/24/19 2051 12/25/19 0326 12/25/19 0425  NA 143 141 142 141 138 136 140  K 6.8* 6.3* 6.4* 6.7* 6.4* 4.0 4.2  4.2  CL 97* 98 101 95*  --   --  95*  CO2 8* 8* 10* 13*  --   --  18*  GLUCOSE 45* 254* 112* 93  --   --  105*  BUN 143* 147* 149* 149*  --   --  90*  CREATININE 14.97* 14.45* 14.23* 13.31*  --   --  8.87*  CALCIUM 9.3 8.4* 8.4* 7.8*  --   --  7.8*  PHOS  --   --   --   --   --   --  5.5*  5.4*   CBC Recent Labs  Lab 12/24/19 0419 12/24/19 2051 12/25/19 0326 12/25/19 0425  WBC 8.8  --   --  14.1*  HGB 15.1 12.6* 12.9* 13.1  HCT 46.8 37.0* 38.0* 37.5*  MCV 90.9  --   --  85.2  PLT 363  --   --  290    Medications:    . albuterol  10 mg Nebulization Once  . Chlorhexidine Gluconate Cloth  6 each Topical Daily  . Chlorhexidine Gluconate Cloth  6 each Topical Q0600  . guaiFENesin  600 mg Oral BID  . heparin      . heparin  5,000 Units Subcutaneous Q8H  . insulin aspart  0-15 Units Subcutaneous TID WC  . insulin aspart  0-5 Units Subcutaneous QHS  . LORazepam  2 mg Intravenous STAT  . mouth rinse  15 mL Mouth Rinse BID  . pantoprazole sodium  40  mg Per Tube Daily  . umeclidinium-vilanterol  1 puff Inhalation Daily      Otelia Santee, MD 12/25/2019, 9:51 AM

## 2019-12-25 NOTE — Progress Notes (Addendum)
Dr Cheral Marker and rapid response nurse at pt side, pt on ct table, pt verbalizing incomprehensible sounds. Repeat cbg 197.

## 2019-12-25 NOTE — Progress Notes (Signed)
[place] Progress Note Patient Name: Kyle [name] DOB: 01/06/1951 MRN: [id number]   Date of Service  12/25/2019  HPI/Events of Note  Hyperglycemia - Patient NPO and on AC/HS moderate Novolog SSI coverage.   eICU Interventions  Will order: 1. Q 4 hour moderate Novolog SSI. 2. D/C AC/HS moderate Novolog SSI.     Intervention Category Major Interventions: Hyperglycemia - active titration of insulin therapy  Cornell Bourbon Eugene 12/25/2019, 9:02 PM

## 2019-12-25 NOTE — Progress Notes (Signed)
Assisted tele visit to patient with family member.  Gagandeep Kossman R, RN  

## 2019-12-25 NOTE — Progress Notes (Signed)
Reason for consult: Encephalopathy  Subjective: Patient remains comatose, nonverbal, not withdrawing.  Not on any sedation   ROS: Unable to obtain due to poor mental status  Examination  Vital signs in last 24 hours: Temp:  [94.3 F (34.6 C)-97.7 F (36.5 C)] 97.7 F (36.5 C) (03/01 0726) Pulse Rate:  [25-107] 73 (03/01 1015) Resp:  [12-32] 17 (03/01 1015) BP: (121-202)/(44-94) 122/61 (03/01 1015) SpO2:  [57 %-100 %] 91 % (03/01 1015) FiO2 (%):  [40 %-100 %] 40 % (03/01 0800) Weight:  [71.3 kg-72 kg] 72 kg (02/28 2245)  General: lying in bed CVS: pulse-normal rate and rhythm RS: breathing comfortably Extremities: normal   Neuro: Mental Status: Patient does not respond to verbal stimuli.  Does not respond to deep sternal rub.  Does not follow commands.  No verbalizations noted.  Cranial Nerves: II: Pupils are equal and reactive bilaterally III,IV,VI: Left gaze preference, does not cross midline V,VII: corneal reflex: Present VIII: patient does not respond to verbal stimuli IX,X: gag reflex present XI: trapezius strength unable to test bilaterally XII: tongue strength unable to test Motor: Extremities flaccid in bilateral upper extremities, has extensor posturing to sternal rub.  There is withdrawal versus triple flexion in bilateral lower extremities. Sensory: Does not respond to noxious stimuli in any extremity. Plantars: Bilateral upgoing Cerebellar: Unable to perform  Basic Metabolic Panel: Recent Labs  Lab 12/24/19 0419 12/24/19 0419 12/24/19 1005 12/24/19 1005 12/24/19 1445 12/24/19 2035 12/24/19 2051 12/25/19 0326 12/25/19 0425  NA 143   < > 141   < > 142 141 138 136 140  K 6.8*   < > 6.3*   < > 6.4* 6.7* 6.4* 4.0 4.2  4.2  CL 97*  --  98  --  101 95*  --   --  95*  CO2 8*  --  8*  --  10* 13*  --   --  18*  GLUCOSE 45*  --  254*  --  112* 93  --   --  105*  BUN 143*  --  147*  --  149* 149*  --   --  90*  CREATININE 14.97*  --  14.45*  --  14.23*  13.31*  --   --  8.87*  CALCIUM 9.3   < > 8.4*   < > 8.4* 7.8*  --   --  7.8*  MG  --   --   --   --   --   --   --   --  2.0  PHOS  --   --   --   --   --   --   --   --  5.5*  5.4*   < > = values in this interval not displayed.    CBC: Recent Labs  Lab 12/24/19 0419 12/24/19 2051 12/25/19 0326 12/25/19 0425  WBC 8.8  --   --  14.1*  HGB 15.1 12.6* 12.9* 13.1  HCT 46.8 37.0* 38.0* 37.5*  MCV 90.9  --   --  85.2  PLT 363  --   --  290     Coagulation Studies: No results for input(s): LABPROT, INR in the last 72 hours.  Imaging Reviewed:     ASSESSMENT AND PLAN  69 year old man with history of COPD, peripheral vascular disease, CHF with EF of 20 to 25%, pulmonary hypertensionadmitted with acute renal failure with severe hyperkalemia, metabolic acidosis and lactic acidosis.  Patient also noted to be using double  his prescribed rosuvastatin.  Patient was alert and oriented, between 730 and 8:30 PM was noted to become nonverbal, flaccid in bilateral upper extremities and a code stroke was called.Glucose was 86,  patient did not improve with D50. ABG showed a low pCO2 of 24.4 and low pO2 of 62, but O2 saturation was 91. He was sent down for STAT CT. CT head was unremarkable and patient underwent stat MRI-negative for stroke, MR angiogram negative for large vessel occlusion.  EEG was performed last night-no seizure activity was seen.  Patient remains significantly obtunded.  This morning, despite not being on sedation is nonverbal, does not track examiner and has a left word gaze but does not cross midline.  Withdraws minimally in bilateral lower extremities with no withdrawal in bilateral upper extremities.    Acute metabolic encephalopathy Uremic encephalopathy Possible seizure with postictal state   Recommendations MRI brain and MR angiogram of the head: Completed, no acute stroke.  No large vessel occlusion, possible multifocal stenosis Stat EEG repeated: No seizures Will  obtain continuous EEG to see if he is having intermittent seizures with postictal state Check ammonia level Check CK to evaluate for rhabdomyolysis Continue to treat metabolic disturbances including elevated BUN , acidosis and hyperglycemia Avoid hypotension or significant hypertension, SBP around [id number] -[id number] systolic  Seizure precautions   CRITICAL CARE Performed by: Lanice Schwab S[place]   Total critical care time: 45 minutes  Critical care time was exclusive of separately billable procedures and treating other patients.  Critical care was necessary to treat or prevent imminent or life-threatening deterioration.  Critical care was time spent personally by me on the following activities: development of treatment plan with patient and/or surrogate as well as nursing, discussions with consultants, evaluation of patient's response to treatment, examination of patient, obtaining history from patient or surrogate, ordering and performing treatments and interventions, ordering and review of laboratory studies, ordering and review of radiographic studies, pulse oximetry and re-evaluation of patient's condition.    Kham Zuckerman [facility] Pager Number [phone] For questions after 7pm please refer to AMION to reach the Neurologist on call    Addendum - Patient alert and responsive, Seizure with post- ictal state?  - will start 500mg  BID Keppra

## 2019-12-25 NOTE — Progress Notes (Signed)
[place] Progress Note Patient Name: [name] DOB: 11-09-1950 MRN: [id number]   Date of Service  12/25/2019  HPI/Events of Note  Notified of HTN SBP 200s that went down to 130s with Fentanyl 100 mcg. ABG 7.45/28/215 on AC 25/600/60%/5 PEEP, MV 15 not breathing over the set rate K now at 4  eICU Interventions   Ordered hydralazine prn for SBP > 180  Decrease TV 500 and RR 15, FiO2 40%     Intervention Category Major Interventions: Acid-Base disturbance - evaluation and management;Respiratory failure - evaluation and management;Hypertension - evaluation and management  Judd Lien 12/25/2019, 4:02 AM

## 2019-12-26 DIAGNOSIS — N179 Acute kidney failure, unspecified: Principal | ICD-10-CM

## 2019-12-26 DIAGNOSIS — I482 Chronic atrial fibrillation, unspecified: Secondary | ICD-10-CM

## 2019-12-26 LAB — RENAL FUNCTION PANEL
Albumin: 2.3 g/dL — ABNORMAL LOW (ref 3.5–5.0)
Anion gap: 21 — ABNORMAL HIGH (ref 5–15)
BUN: 120 mg/dL — ABNORMAL HIGH (ref 8–23)
CO2: 28 mmol/L (ref 22–32)
Calcium: 7.7 mg/dL — ABNORMAL LOW (ref 8.9–10.3)
Chloride: 90 mmol/L — ABNORMAL LOW (ref 98–111)
Creatinine, Ser: 10.7 mg/dL — ABNORMAL HIGH (ref 0.61–1.24)
GFR calc Af Amer: 5 mL/min — ABNORMAL LOW (ref >60)
GFR calc non Af Amer: 4 mL/min — ABNORMAL LOW (ref >60)
Glucose, Bld: 117 mg/dL — ABNORMAL HIGH (ref 70–99)
Phosphorus: 6.1 mg/dL — ABNORMAL HIGH (ref 2.5–4.6)
Potassium: 3.5 mmol/L (ref 3.5–5.1)
Sodium: 139 mmol/L (ref 135–145)

## 2019-12-26 LAB — HEPATIC FUNCTION PANEL
ALT: 30 U/L (ref 0–44)
AST: 31 U/L (ref 15–41)
Albumin: 2.3 g/dL — ABNORMAL LOW (ref 3.5–5.0)
Alkaline Phosphatase: 55 U/L (ref 38–126)
Bilirubin, Direct: 0.1 mg/dL (ref 0.0–0.2)
Indirect Bilirubin: 1.4 mg/dL — ABNORMAL HIGH (ref 0.3–0.9)
Total Bilirubin: 1.5 mg/dL — ABNORMAL HIGH (ref 0.3–1.2)
Total Protein: 5.5 g/dL — ABNORMAL LOW (ref 6.5–8.1)

## 2019-12-26 LAB — CBC
HCT: 35.6 % — ABNORMAL LOW (ref 39.0–52.0)
Hemoglobin: 12.3 g/dL — ABNORMAL LOW (ref 13.0–17.0)
MCH: 29.5 pg (ref 26.0–34.0)
MCHC: 34.6 g/dL (ref 30.0–36.0)
MCV: 85.4 fL (ref 80.0–100.0)
Platelets: 208 10*3/uL (ref 150–400)
RBC: 4.17 MIL/uL — ABNORMAL LOW (ref 4.22–5.81)
RDW: 14.8 % (ref 11.5–15.5)
WBC: 11.5 10*3/uL — ABNORMAL HIGH (ref 4.0–10.5)
nRBC: 0 % (ref 0.0–0.2)

## 2019-12-26 LAB — GLUCOSE, CAPILLARY
Glucose-Capillary: 99 mg/dL (ref 70–99)
Glucose-Capillary: 127 mg/dL — ABNORMAL HIGH (ref 70–99)
Glucose-Capillary: 106 mg/dL — ABNORMAL HIGH (ref 70–99)
Glucose-Capillary: 140 mg/dL — ABNORMAL HIGH (ref 70–99)
Glucose-Capillary: 141 mg/dL — ABNORMAL HIGH (ref 70–99)
Glucose-Capillary: 165 mg/dL — ABNORMAL HIGH (ref 70–99)

## 2019-12-26 LAB — BASIC METABOLIC PANEL
Anion gap: 17 — ABNORMAL HIGH (ref 5–15)
BUN: 36 mg/dL — ABNORMAL HIGH (ref 8–23)
CO2: 26 mmol/L (ref 22–32)
Calcium: 8.4 mg/dL — ABNORMAL LOW (ref 8.9–10.3)
Chloride: 93 mmol/L — ABNORMAL LOW (ref 98–111)
Creatinine, Ser: 4.24 mg/dL — ABNORMAL HIGH (ref 0.61–1.24)
GFR calc Af Amer: 16 mL/min — ABNORMAL LOW (ref >60)
GFR calc non Af Amer: 14 mL/min — ABNORMAL LOW (ref >60)
Glucose, Bld: 126 mg/dL — ABNORMAL HIGH (ref 70–99)
Potassium: 3 mmol/L — ABNORMAL LOW (ref 3.5–5.1)
Sodium: 136 mmol/L (ref 135–145)

## 2019-12-26 LAB — HEPATITIS B SURFACE ANTIBODY, QUANTITATIVE: Hep B S AB Quant (Post): <3.1 m[IU]/mL — ABNORMAL LOW (ref >9.9)

## 2019-12-26 LAB — HEPATITIS B E ANTIGEN: Hep B E Ag: NEGATIVE

## 2019-12-26 MED ORDER — LEVETIRACETAM IN NACL 500 MG/100ML IV SOLN
500.0000 mg | Freq: Two times a day (BID) | INTRAVENOUS | Status: DC
  Administered 2019-12-26 (×2): 500 mg via INTRAVENOUS
  Filled 2019-12-26 (×2): qty 100

## 2019-12-26 MED ORDER — ARFORMOTEROL TARTRATE 15 MCG/2ML IN NEBU
15.0000 ug | INHALATION_SOLUTION | Freq: Two times a day (BID) | RESPIRATORY_TRACT | Status: DC
  Administered 2019-12-26 (×2): 15 ug via RESPIRATORY_TRACT
  Filled 2019-12-26 (×3): qty 2

## 2019-12-26 MED ORDER — VITAL HIGH PROTEIN PO LIQD: 1000.0000 mL | ORAL | Status: DC

## 2019-12-26 MED ORDER — PRO-STAT SUGAR FREE PO LIQD: 30.0000 mL | Freq: Two times a day (BID) | ORAL | Status: DC

## 2019-12-26 MED ORDER — BUDESONIDE 0.5 MG/2ML IN SUSP
0.5000 mg | Freq: Two times a day (BID) | RESPIRATORY_TRACT | Status: DC
  Administered 2019-12-26 (×2): 0.5 mg via RESPIRATORY_TRACT
  Filled 2019-12-26 (×2): qty 2

## 2019-12-26 MED ORDER — HEPARIN SODIUM (PORCINE) 1000 UNIT/ML IJ SOLN
INTRAMUSCULAR | Status: AC
  Administered 2019-12-26: 2.4 [IU] via INTRAVENOUS_CENTRAL
  Filled 2019-12-26: qty 3

## 2019-12-26 MED ORDER — SODIUM CHLORIDE 0.9 % IV SOLN: INTRAVENOUS | Status: DC

## 2019-12-26 MED ORDER — METHYLPREDNISOLONE SODIUM SUCC 125 MG IJ SOLR
40.0000 mg | Freq: Every day | INTRAMUSCULAR | Status: DC
  Administered 2019-12-26: 40 mg via INTRAVENOUS
  Filled 2019-12-26: qty 2

## 2019-12-26 MED ORDER — GUAIFENESIN 100 MG/5ML PO SOLN
10.0000 mL | Freq: Two times a day (BID) | ORAL | Status: DC
  Administered 2019-12-26: 200 mg
  Filled 2019-12-26 (×2): qty 5

## 2019-12-26 NOTE — Procedures (Addendum)
Patient Name:Kyle [name] Epilepsy Attending:Korbin Mapps [name] Referring Physician/Provider:Dr Karena Addison Aroor Duration: 12/25/2019 1127 to 12/26/2019 1054  Patient history:69 year old male who presented to the hospital today with acute renal failure and AMS. Had sudden onset of worsened AMS with decreased movement. 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  Level of alertness:awake/lethargic, asleep  AEDs during EEG study: LEV  Technical aspects: This EEG study was done with scalp electrodes positioned according to the 10-20 International system of electrode placement. Electrical activity was acquired at a sampling rate of 500Hz  and reviewed with a high frequency filter of 70Hz  and a low frequency filter of 1Hz . EEG data were recorded continuously and digitally stored.  DESCRIPTION: During awake state, no clear posterior dominant rhythm was seen.  Sleep was characterized by vertex waves, maximal frontocentral.  EEG showed continuous generalized 3-5Hz  theta-delta slowing.Hyperventilation and photic stimulation were not performed.  ABNORMALITY - Continuous rhythmic slow, generalized  IMPRESSION: This study issuggestive of moderate to severe diffuse encephalopathy, non specific to etiology.No seizures or epileptiform discharges were seen throughout the recording.  EEG appears to be improving compared to previous day.  Avanthika Dehnert [name]

## 2019-12-26 NOTE — Progress Notes (Addendum)
NAME:  Kyle [name], MRN:  [id number], DOB:  [birth year], LOS: 2 ADMISSION DATE:  12/24/2019, CONSULTATION DATE:  12/24/19 REFERRING MD:  ER, CHIEF COMPLAINT:  Abd pain   Brief History   69 year old man with a history of heavy smoking, COPD, peripheral vascular disease presenting with nausea, intermittent vomiting, abd cramping/pain and diarrhea for 1 week found to have acute renal failure, hyperkalemia and severe lactic acidosis of unclear etiology.  Past Medical History  COPD on home oxygen Severe peripheral vascular disease History of heavy smoking Insulin-dependent diabetes  Significant Hospital Events   2/28 Admitted 3/01 Intubated overnight due to worsening mental status  Consults:  Nephrology, Hospitalist, PCCM  Procedures:  R IJ HD 2/28 >>  ETT 3/1 >>   Significant Diagnostic Tests:   CT Renal 2/28 >> No acute findings in the abdomen or pelvis.  Minimal left lower lobe collapse/consolidation.  5 mm right lower lobe pulmonary nodule.    MRI Brain 2/28 >> no acute findings  MRA Brain 2/28 >> motion degraded, no acute findings  EEG 3/2 >> suggestive of moderate to severe diffuse encephalopathy, non-specific etiology  Micro Data:  COVID 2/28 >> negative  Antimicrobials:     Interim history/subjective:  RN reports pt intermittently agitated, trying to get out of bed while intubated).  Follows commands.  Afebrile  Glucose range 102-127 PEEP 8 / FiO2 40%  Objective   Blood pressure (!) 164/77, pulse 63, temperature 98.1 F (36.7 C), temperature source Oral, resp. rate 12, height 5\' 11"  (1.803 m), weight 72 kg, SpO2 90 %.    Vent Mode: PSV;CPAP FiO2 (%):  [40 %] 40 % Set Rate:  [15 bmp] 15 bmp Vt Set:  [500 mL] 500 mL PEEP:  [8 cmH20] 8 cmH20 Pressure Support:  [5 cmH20-8 cmH20] 5 cmH20 Plateau Pressure:  [14 cmH20-19 cmH20] 19 cmH20   Intake/Output Summary (Last 24 hours) at 12/26/2019 0845 Last data filed at 12/26/2019 0800 Gross per 24 hour  Intake 1149.38  ml  Output 1755 ml  Net -605.62 ml   Filed Weights   12/24/19 0404 12/24/19 1402 12/24/19 2245  Weight: 80 kg 71.3 kg 72 kg   Physical Exam: General: chronically ill appearing adult male lying in bed on vent in NAD HEENT: MM pink/moist, ETT, EEG leads in place, pupils 71mm =/reactive  Neuro: eyes open, follows commands, moves all extremities CV: s1s2 RRR, no m/r/g PULM:  Non-labored on vent, rhonchi bilaterally, minimal secretions from ETT GI: soft, bsx4 active, gastric tube with small amt (~ 75-164ml) bloody drainage  Extremities: warm/dry, no edema, LLE surgical incisions with small amt crusting   Skin: no rashes or lesions  Resolved Hospital Problem list      Assessment & Plan:   Acute Metabolic Encephalopathy Suspect in setting of renal failure / uremia.  Became more obtunded overnight 3/1 and encephalopathic. CT head and MRI negative for acute findings. Ammonia 56.  EEG negative. CK 519. -appreciate Neurology evaluation  -mental status improving with supportive care -EEG as above, to stop continuous 3/2 -continue keppra, plan for dosing after HD  Acute on Chronic Hypoxemic Respiratory Failure COPD Oxygen dependent, on anoro at home.  -PRVC 8cckg  -wean PEEP / FiO2 for sats 88-95% -add brovana, pulmicort in place of home anoro -SBT as tolerated, nearing extubation  -VAP prevention measures   Coffee-Ground Emesis -follow CBC, Hgb stable -PPI BID  -monitor output  Lactic Acidosis   N/V/D with Abdominal Pain Diarrhea. Possible ischemic colitis in setting  of hx of PAD however no acute findings on CT abdomen/renal stone study -lactic acid cleared -fluid balance per Nephrology  -NS at 50 ml/hr  AKI  Stage III CKD AGMA  Hyperkalemia  Likely pre-renal in setting of poor PO and N/V/D. S/p temporizing measures and lokelma.   -appreciate Nephrology assistance with patient care  -repeat BMP now given rise in sr cr, ? Lab error vs worsening of sr cr which would require  HD.   -assess urine studies  -Trend BMP / urinary output -Replace electrolytes as indicated -Avoid nephrotoxic agents, ensure adequate renal perfusion -strict I/O's   Atrial Fibrillation PVD Dilated cardiomyopathy Aortic stenosis HTN On apixiban at home for AF -hold home apixiban for now -continue norvasc, coreg -hold home lasix, quinapril   IDDM Hypoglycemic on admission -SSI, moderate scale   At Risk Malnutrition  -begin TF per Nutrition   RLL Lung nodule, 17mm -will need follow up CT imaging in 12 months  Best practice:  Diet: Start TF Pain/Anxiety/Delirium protocol (if indicated): PRN fentanyl  VAP protocol (if indicated): in place  DVT prophylaxis: SCDs, heparin.  Apixaban on hold.  GI prophylaxis: PPI Glucose control: SSI Mobility: BR Code Status: Full Code Family Communication: Wife Lucita Ferrara) called 3/2 for update. No answer at [phone], message left for return call.  Disposition: ICU  Labs   CBC: Recent Labs  Lab 12/24/19 0419 12/24/19 0419 12/24/19 2051 12/25/19 0326 12/25/19 0425 12/25/19 1300 12/26/19 0628  WBC 8.8  --   --   --  14.1* 12.5* 11.5*  HGB 15.1   < > 12.6* 12.9* 13.1 12.5* 12.3*  HCT 46.8   < > 37.0* 38.0* 37.5* 35.4* 35.6*  MCV 90.9  --   --   --  85.2 82.1 85.4  PLT 363  --   --   --  290 261 208   < > = values in this interval not displayed.    Basic Metabolic Panel: Recent Labs  Lab 12/24/19 1005 12/24/19 1005 12/24/19 1445 12/24/19 1445 12/24/19 2035 12/24/19 2035 12/24/19 2051 12/24/19 2051 12/25/19 0326 12/25/19 0326 12/25/19 0425 12/25/19 0425 12/25/19 1052 12/25/19 1157 12/25/19 1855 12/25/19 2124 12/26/19 0629  NA 141   < > 142   < > 141  --  138  --  136  --  140  --   --   --   --   --  139  K 6.3*   < > 6.4*   < > 6.7*   < > 6.4*   < > 4.0   < > 4.2  4.2   < > 3.8 3.8 3.7 4.1 3.5  CL 98  --  101  --  95*  --   --   --   --   --  95*  --   --   --   --   --  90*  CO2 8*  --  10*  --  13*  --   --    --   --   --  18*  --   --   --   --   --  28  GLUCOSE 254*  --  112*  --  93  --   --   --   --   --  105*  --   --   --   --   --  117*  BUN 147*  --  149*  --  149*  --   --   --   --   --  90*  --   --   --   --   --  120*  CREATININE 14.45*  --  14.23*  --  13.31*  --   --   --   --   --  8.87*  --   --   --   --   --  10.70*  CALCIUM 8.4*  --  8.4*  --  7.8*  --   --   --   --   --  7.8*  --   --   --   --   --  7.7*  MG  --   --   --   --   --   --   --   --   --   --  2.0  --   --   --   --   --   --   PHOS  --   --   --   --   --   --   --   --   --   --  5.5*  5.4*  --   --   --   --   --  6.1*   < > = values in this interval not displayed.   GFR: Estimated Creatinine Clearance: 6.8 mL/min (A) (by C-G formula based on SCr of 10.7 mg/dL (H)). Recent Labs  Lab 12/24/19 0419 12/24/19 0600 12/24/19 0810 12/25/19 0425 12/25/19 1300 12/25/19 1630 12/25/19 1855 12/26/19 0628  WBC 8.8  --   --  14.1* 12.5*  --   --  11.5*  LATICACIDVEN  --  10.2* 9.3*  --   --  2.8* 2.8*  --     Liver Function Tests: Recent Labs  Lab 12/24/19 0419 12/25/19 0425 12/26/19 0628 12/26/19 0629  AST 36 37 31  --   ALT 28 27 30   --   ALKPHOS 65 53 55  --   BILITOT 2.5* 1.1 1.5*  --   PROT 7.7 6.0* 5.5*  --   ALBUMIN 3.0* 2.5*  2.5* 2.3* 2.3*   Recent Labs  Lab 12/24/19 0419  LIPASE 71*   Recent Labs  Lab 12/25/19 1052  AMMONIA 56*    ABG    Component Value Date/Time   PHART 7.457 (H) 12/25/2019 0326   PCO2ART 27.8 (L) 12/25/2019 0326   PO2ART 215.0 (H) 12/25/2019 0326   HCO3 19.8 (L) 12/25/2019 0326   TCO2 21 (L) 12/25/2019 0326   ACIDBASEDEF 3.0 (H) 12/25/2019 0326   O2SAT 100.0 12/25/2019 0326     Coagulation Profile: No results for input(s): INR, PROTIME in the last 168 hours.  Cardiac Enzymes: Recent Labs  Lab 12/24/19 0419 12/25/19 1157  CKTOTAL 321 519*    HbA1C: Hgb A1c MFr Bld  Date/Time Value Ref Range Status  12/24/2019 08:10 AM 8.0 (H) 4.8 - 5.6 %  Final    Comment:    (NOTE) Pre diabetes:          5.7%-6.4% Diabetes:              >6.4% Glycemic control for   <7.0% adults with diabetes   11/30/2019 09:37 AM 7.6 (H) 4.8 - 5.6 % Final    Comment:             Prediabetes: 5.7 - 6.4          Diabetes: >6.4          Glycemic control for adults with diabetes: <7.0  CBG: Recent Labs  Lab 12/25/19 1105 12/25/19 1543 12/25/19 2014 12/25/19 2319 12/26/19 0813  GLUCAP 82 88 103* 102* 127*   Critical care time: 35 minutes    Noe Gens, MSN, NP-C [facility] 12/26/2019, 8:50 AM   Please see [email or web address] for pager details.

## 2019-12-26 NOTE — Progress Notes (Signed)
LTM maint complete - no skin breakdown under:  fp1,fp2,p3,p4

## 2019-12-26 NOTE — Progress Notes (Signed)
[place] Progress Note Patient Name: Kyle [name] DOB: Dec 13, 1950 MRN: [id number]   Date of Service  12/26/2019  HPI/Events of Note  Cautious hydration with isotonic fluid recommended by Nephrology. No IV fluid running.   eICU Interventions  Will order: 1. 0.9 NaCl IV infusion at 50 mL/hour.      Intervention Category Major Interventions: Other:  Janney Priego Cornelia Copa 12/26/2019, 1:48 AM

## 2019-12-26 NOTE — Progress Notes (Signed)
LTM EEG discontinued. No skin breakdown noted. 

## 2019-12-26 NOTE — Progress Notes (Signed)
NEUROLOGY PROGRESS NOTE  Subjective: No current complaints.  Exam: Vitals:   12/26/19 0754 12/26/19 0800  BP: (!) 155/73 (!) 164/77  Pulse: 67 63  Resp: 15 12  Temp:  98.1 F (36.7 C)  SpO2: 92% 90%    Physical Exam  Constitutional: Appears well-developed and well-nourished.  Psych: Affect appropriate to situation Eyes: No scleral injection HENT: No OP obstrucion Head: Normocephalic.  Cardiovascular: Normal rate and regular rhythm.  Respiratory: Intubated but breathing over the vent GI: Soft.  No distension. There is no tenderness.  Skin: WDI   Neuro:  Mental Status: Patient is awake, when asked if in pain he states no with his head, when asked if he can move his extremities he shakes his head yes and does so, able to take part and exam. Cranial Nerves: II:  Visual fields grossly normal  III,IV, VI: ptosis not present, extra-ocular motions intact bilaterally pupils equal, round, reactive to light and accommodation V,VII: Face symmetric, facial light touch sensation normal bilaterally VIII: hearing normal bilaterally  Motor: Moving all extremities antigravity equally Sensory: Pinprick and light touch intact throughout, bilaterally Deep Tendon Reflexes: 2+ and symmetric throughout Plantars: Right: Mute left: Upgoing     Medications:  Scheduled: . albuterol  10 mg Nebulization Once  . amLODipine  5 mg Per Tube Daily  . carvedilol  12.5 mg Per Tube BID WC  . chlorhexidine gluconate (MEDLINE KIT)  15 mL Mouth Rinse BID  . Chlorhexidine Gluconate Cloth  6 each Topical Daily  . guaiFENesin  600 mg Oral BID  . heparin  5,000 Units Subcutaneous Q8H  . insulin aspart  0-15 Units Subcutaneous Q4H  . mouth rinse  15 mL Mouth Rinse 10 times per day  . pantoprazole (PROTONIX) IV  40 mg Intravenous Q12H   Continuous: . sodium chloride 50 mL/hr at 12/26/19 0800  . levETIRAcetam      Pertinent Labs/Diagnostics: Glucose 127 Chloride 90 BUN 120 Creatinine 10.7 CK on  12/25/2019 519 Ammonia 56 Potassium 3.8  EEG  Result Date: 12/25/2019 Lora Havens, MD     12/25/2019  1:52 PM Patient Name: Kyle [name] MRN: [id number] Epilepsy Attending: Lora Havens Referring Physician/Provider: Dr Karena Addison Ernesha Ramone Date: 12/25/2019 Duration: 24.44 mins  Patient history: 68 year old male who presented to the hospital today with acute renal failure and AMS. Had sudden onset of worsened AMS with decreased movement. MRI Brain showed no acute finding. 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  Level of alertness: comatose  AEDs during EEG study: None  Technical aspects: This EEG study was done with scalp electrodes positioned according to the 10-20 International system of electrode placement. Electrical activity was acquired at a sampling rate of '500Hz'$  and reviewed with a high frequency filter of '70Hz'$  and a low frequency filter of '1Hz'$ . EEG data were recorded continuously and digitally stored.  DESCRIPTION:  EEG showed continuous generalized and lateralized right hemisphere 2-'3hz'$  rhythmic low amplitude delta slowing. Intermittent sharply contoured, generalized, maximal bifrontal 4-'5hz'$  theta slowing was also noted. EEG was reactive to noxious stimulation.  Hyperventilation and photic stimulation were not performed.  ABNORMALITY - Continuous rhythmic slow, generalized  IMPRESSION: This study is suggestive of profound diffuse encephalopathy, non specific to etiology.  No seizures or epileptiform discharges were seen throughout the recording. EEG appeared similar to last night. Lora Havens   EEG  Result Date: 12/24/2019 Lora Havens, MD     12/24/2019 11:57 PM Patient  Name: Kyle [name] MRN: [id number] Epilepsy Attending: Lora Havens Referring Physician/Provider: Dr Kerney Elbe Date: 12/24/2019 Duration: 25.25 mins Patient history: 69 year old male who presented to the hospital today with acute renal failure and AMS. Had sudden onset  of worsened AMS with decreased movement. MRI Brain showed no acute finding. 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 Level of alertness: comatose AEDs during EEG study: None Technical aspects: This EEG study was done with scalp electrodes positioned according to the 10-20 International system of electrode placement. Electrical activity was acquired at a sampling rate of '500Hz'$  and reviewed with a high frequency filter of '70Hz'$  and a low frequency filter of '1Hz'$ . EEG data were recorded continuously and digitally stored. DESCRIPTION:  EEG showed continuous generalized 2-'3hz'$  rhythmic low amplitude delta slowing. Intermittent sharply contoured, generalized, maximal bifrontal 4-'5hz'$  theta slowing was also noted. EEG was reactive to noxious stimulation.  Hyperventilation and photic stimulation were not performed. ABNORMALITY - Continuous rhythmic slow, generalized IMPRESSION: This study is suggestive of profound diffuse encephalopathy, non specific to etiology.  No seizures or epileptiform discharges were seen throughout the recording. Dr Cheral Marker was notifed Lora Havens    Overnight EEG with video  Result Date: 12/26/2019 Lora Havens, MD     12/26/2019  8:42 AM Patient Name:Kyle [name] NGE:[id number] Epilepsy Attending:Priyanka [name] Referring Physician/Provider:Dr Karena Addison Kyliana Standen Duration: 12/25/2019 1127 to 12/26/2019 0840  Patient history:69 year old male who presented to the hospital today with acute renal failure and AMS. Had sudden onset of worsened AMS with decreased movement. 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  Level of alertness:awake/lethargic, asleep  AEDs during EEG study: LEV  Technical aspects: This EEG study was done with scalp electrodes positioned according to the 10-20 International system of electrode placement. Electrical activity was acquired at a  sampling rate of '500Hz'$  and reviewed with a high frequency filter of '70Hz'$  and a low frequency filter of '1Hz'$ . EEG data were recorded continuously and digitally stored.  DESCRIPTION: During awake state, no clear posterior dominant rhythm was seen.  Sleep was characterized by vertex waves, maximal frontocentral.  EEG showed continuous generalized 3-'5Hz'$  theta-delta slowing.Hyperventilation and photic stimulation were not performed.  ABNORMALITY - Continuous rhythmic slow, generalized  IMPRESSION: This study issuggestive of moderate to severe diffuse encephalopathy, non specific to etiology.No seizures or epileptiform discharges were seen throughout the recording.  EEG appears to be improving compared to previous day. Priyanka Cipriano Mile PA-C [facility] [phone]  Assessment:  69 year old male with history of COPD, peripheral vascular disease, CHF with EF of 20 to 25%, pulmonary hypertension who is initially admitted with acute renal failure with severe hyperkalemia, metabolic acidosis and lactic acidosis.  Patient was also noted to be using double his prescription of was of Rosuvastatin.  Between 730 and 8:30 PM he was noted to become nonverbal, flaccid in bilateral upper extremities and code stroke was called.  Glucose noted to be 86, patient did not improve with D50.  ABG showed a low PCO2 of 24.4 and a low PO2 of 62 however, O2 saturation was 91.  CT head, MRI and MRA of head did not show any large vessel occlusion.  Spot EEG showed no seizures.  Prolonged overnight LTM again did not show any acute seizures or episodes.  Patient has improved on exam today.  He now is following commands, moving all  extremities antigravity with good strength, and breathing over the vent.   Impression: Acute metabolic encephalopathy Uremic encephalopathy-nephrology  to restart hemodialysis Possible seizure with postictal state with overnight EEG showing no epileptiform  activity.  Recommendations: -Repeat CK -We will DC LTM -Renal following and considering restarting hemodialysis secondary to increased BUN/creatinine. -Continue Keppra 500 mg twice daily ( long term AED may not be needed, possible seizure likely provoked in the setting of Uremia)    12/26/2019, 8:43 AM   NEUROHOSPITALIST ADDENDUM Performed a face to face diagnostic evaluation.   I have reviewed the contents of history and physical exam as documented by PA/ARNP/Resident and agree with above documentation.  I have discussed and formulated the above plan as documented. Edits to the note have been made as needed.  EEG did not show any epileptiform discharges, patient's mental status improved spontaneously. ?  Seizure with postictal state.  BUN remains elevated 120-getting dialysis today. Seizure likely provoked in the setting of elevated BUN/metabolic abnormalities-however we will continue Keppra for now.  May consider discontinuing in the future after metabolic issues are resolved.      Karena Addison Keary Hanak MD [facility] [phone]   If 7pm to 7am, please call on call as listed on AMION.

## 2019-12-26 NOTE — Procedures (Signed)
Extubation Procedure Note  Patient Details:   Name: Kyle [name] DOB: [birth year] MRN: [id number]   Airway Documentation:    Vent end date: 12/26/19 Vent end time: 1549   Evaluation  O2 sats: stable throughout Complications: No apparent complications Patient did tolerate procedure well. Bilateral Breath Sounds: Rhonchi, Diminished   Yes   Patient extubated to 5L nasal cannula per MD order.  Positive cuff leak noted.  No evidence of stridor.  Patient able to speak post extubation.  Pulse ox is not reading well however when had a good waveform patient's sats were 93%.  Will continue to monitor.   Judith Part 12/26/2019, 3:53 PM

## 2019-12-26 NOTE — Progress Notes (Signed)
Initial Nutrition Assessment  DOCUMENTATION CODES:   Not applicable  INTERVENTION:   Recommend diet advancement to Regular as respiratory status allows.   RD to follow for diet advancement, adequacy of intake, and add appropriate PO supplements as needed.   NUTRITION DIAGNOSIS:   Inadequate oral intake related to inability to eat as evidenced by NPO status.  GOAL:   Patient will meet greater than or equal to 90% of their needs  MONITOR:   Diet advancement, PO intake, Labs  REASON FOR ASSESSMENT:   Consult Enteral/tube feeding initiation and management  ASSESSMENT:   69 yo male admitted with abdominal pain, AKI, hyperkalemia. PMH includes HTN, cardiomyopathy, heavy smoking, COPD on home oxygen, severe PVD, insulin dependent diabetes.   Discussed patient in ICU rounds and with RN today. Received MD Consult for TF initiation and management. TF was never started this morning due to plans for extubation. Patient was extubated this afternoon. Renal function is worsening and may require HD.   Labs reviewed. BUN 120 (H), creat 10.7 (H), Phos 6.1 (H) CBG's: 127-106-140  Medications reviewed and include Novolog, Solu-medrol, Protonix, Keppra.  Weight encounters reviewed. No significant weight changes noted.   NUTRITION - FOCUSED PHYSICAL EXAM:    Most Recent Value  Orbital Region  No depletion  Upper Arm Region  Unable to assess  Thoracic and Lumbar Region  Unable to assess  Buccal Region  Unable to assess  Temple Region  Moderate depletion  Clavicle Bone Region  Mild depletion  Clavicle and Acromion Bone Region  Mild depletion  Scapular Bone Region  Unable to assess  Dorsal Hand  Mild depletion  Patellar Region  Moderate depletion  Anterior Thigh Region  Moderate depletion  Posterior Calf Region  Moderate depletion  Edema (RD Assessment)  Mild  Hair  Reviewed  Eyes  Unable to assess  Mouth  Unable to assess  Skin  Reviewed  Nails  Reviewed       Diet Order:    Diet Order            Diet NPO time specified  Diet effective now              EDUCATION NEEDS:   Not appropriate for education at this time  Skin:  Skin Assessment: Reviewed RN Assessment  Last BM:  PTA  Height:   Ht Readings from Last 1 Encounters:  12/24/19 5\' 11"  (1.803 m)    Weight:   Wt Readings from Last 1 Encounters:  12/26/19 70 kg    Ideal Body Weight:  78.2 kg  BMI:  Body mass index is 21.52 kg/m.  Estimated Nutritional Needs:   Kcal:  2000-2200  Protein:  90-110 gm  Fluid:  1 L + UOP    Molli Barrows, RD, LDN, CNSC Please refer to Amion for contact information.

## 2019-12-26 NOTE — Progress Notes (Signed)
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 Has COPD on 4L home O2. In ED labs showed creat 14, K 6.3, on lasix and ace inhibitor at home. Pt admitted to [facility]. Asked to see for AKI; cr 1.47 on 11/30/19 w/ BL  cr from Jun 2020- feb 2021  1.4- 1.7.   Assessment/ Plan:   1. AoCKD 3 - in setting of vol depletion/ vomiting/ diarrhea and ACEi. B/l creat is 1.5. Tx for hyperkalemia and po K+ lowering meds; K better now. No indication for HD; would leave the cath in till Wed to ensure improving trend. Continue  holding acei.  - unclear why renal function was improving yest and now worsening. - will check bladder scan to ensure foley doesn't need to be flushed or changed - urine studies and repeat BMet. - Plan on HD if renal function is indeed worsening. 2. DM on insulin 3. Vol depletion - getting IVF's w/ bicarb, sp 1L NS. BP's higher  4. Lactic / metabolic acidosis - severe but now resolved. +diarrhea. 5. COPD - on home O2, currently intubated 3/1  Subjective:   Blank stare seen by neurology, no events on EEG overnight.   Objective:   BP (!) 164/77 (BP Location: Right Arm)   Pulse 63   Temp 98.1 F (36.7 C) (Oral)   Resp 12   Ht '5\' 11"'$  (1.803 m)   Wt 72 kg   SpO2 90%   BMI 22.14 kg/m   Intake/Output Summary (Last 24 hours) at 12/26/2019 0858 Last data filed at 12/26/2019 0800 Gross per 24 hour  Intake 1149.38 ml  Output 1755 ml  Net -605.62 ml   Weight change:   Physical Exam: Genin bed in ICU, intub, not responding to commands but eyes open No rash, cyanosis or gangrene No jvd or bruits Chest clear bilatto bases, no rales RRR no MRG Abd soft ntnd no massor ascites Ext + LEedema, no wounds or ulcers Access: RIJ temp  Imaging: EEG  Result Date: 12/25/2019 Lora Havens, MD      12/25/2019  1:52 PM Patient Name: Kyle [name] MRN: [id number] Epilepsy Attending: Lora Havens Referring Physician/Provider: Dr Karena Addison Aroor Date: 12/25/2019 Duration: 24.44 mins  Patient history: 69 year old male who presented to the hospital today with acute renal failure and AMS. Had sudden onset of worsened AMS with decreased movement. MRI Brain showed no acute finding. 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  Level of alertness: comatose  AEDs during EEG study: None  Technical aspects: This EEG study was done with scalp electrodes positioned according to the 10-20 International system of electrode placement. Electrical activity was acquired at a sampling rate of '500Hz'$  and reviewed with a high frequency filter of '70Hz'$  and a low frequency filter of '1Hz'$ . EEG data were recorded continuously and digitally stored.  DESCRIPTION:  EEG showed continuous generalized and lateralized right hemisphere 2-'3hz'$  rhythmic low amplitude delta slowing. Intermittent sharply contoured, generalized, maximal bifrontal 4-'5hz'$  theta slowing was also noted. EEG was reactive to noxious stimulation.  Hyperventilation and photic stimulation were not performed.  ABNORMALITY - Continuous rhythmic slow, generalized  IMPRESSION: This study is suggestive of profound diffuse encephalopathy, non specific to etiology.  No seizures or epileptiform discharges were seen throughout the recording. EEG  appeared similar to last night. Lora Havens   EEG  Result Date: 12/24/2019 Lora Havens, MD     12/24/2019 11:57 PM Patient Name: Kyle [name] MRN: [id number] Epilepsy Attending: Lora Havens Referring Physician/Provider: Dr Kerney Elbe Date: 12/24/2019 Duration: 25.25 mins Patient history: 69 year old male who presented to the hospital today with acute renal failure and AMS. Had sudden onset of worsened AMS with decreased movement. MRI Brain showed no acute finding. Very tiny left posterior  frontal old cortical infarction.EEG to evaluate for seizure. Level of alertness: comatose AEDs during EEG study: None Technical aspects: This EEG study was done with scalp electrodes positioned according to the 10-20 International system of electrode placement. Electrical activity was acquired at a sampling rate of '500Hz'$  and reviewed with a high frequency filter of '70Hz'$  and a low frequency filter of '1Hz'$ . EEG data were recorded continuously and digitally stored. DESCRIPTION:  EEG showed continuous generalized 2-'3hz'$  rhythmic low amplitude delta slowing. Intermittent sharply contoured, generalized, maximal bifrontal 4-'5hz'$  theta slowing was also noted. EEG was reactive to noxious stimulation.  Hyperventilation and photic stimulation were not performed. ABNORMALITY - Continuous rhythmic slow, generalized IMPRESSION: This study is suggestive of profound diffuse encephalopathy, non specific to etiology.  No seizures or epileptiform discharges were seen throughout the recording. Dr Cheral Marker was notifed Lora Havens   MR ANGIO HEAD WO CONTRAST  Result Date: 12/24/2019 CLINICAL DATA:  Altered mental status. Bilateral lower extremity weakness. EXAM: MRI HEAD WITHOUT CONTRAST MRA HEAD WITHOUT CONTRAST TECHNIQUE: Multiplanar, multiecho pulse sequences of the brain and surrounding structures were obtained without intravenous contrast. Angiographic images of the head were obtained using MRA technique without contrast. COMPARISON:  Head CT same day. FINDINGS: MRI HEAD FINDINGS Brain: Diffusion imaging does not show any acute or subacute infarction. No abnormality is seen affecting the brainstem or cerebellum. Cerebral hemispheres show symmetric old white matter infarctions in the posterior frontal white matter and an old tiny left posterior frontal cortical infarction. No large vessel territory infarction. No mass lesion, hemorrhage, hydrocephalus or extra-axial collection. Vascular: Major vessels at the base of the brain  show flow. Skull and upper cervical spine: Negative Sinuses/Orbits: Clear/normal Other: None MRA HEAD FINDINGS Both internal carotid arteries are patent through the skull base and siphon regions. The anterior and middle cerebral vessels show flow. There is considerable motion degradation. That could potentially be multiple stenoses in the anterior and middle cerebral arteries. Both vertebral arteries are patent to the basilar. Posterior circulation branch vessels show flow. There could possibly be stenoses within the PCA branches. IMPRESSION: MRI head: No acute finding. 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 MRA head: Study is markedly degraded by motion. No large vessel occlusion. Potential for multiple stenoses in the anterior, middle and posterior cerebral artery territories. Detail markedly limited by motion. Electronically Signed   By: Nelson Chimes M.D.   On: 12/24/2019 22:31   MR BRAIN WO CONTRAST  Result Date: 12/24/2019 CLINICAL DATA:  Altered mental status. Bilateral lower extremity weakness. EXAM: MRI HEAD WITHOUT CONTRAST MRA HEAD WITHOUT CONTRAST TECHNIQUE: Multiplanar, multiecho pulse sequences of the brain and surrounding structures were obtained without intravenous contrast. Angiographic images of the head were obtained using MRA technique without contrast. COMPARISON:  Head CT same day. FINDINGS: MRI HEAD FINDINGS Brain: Diffusion imaging does not show any acute or subacute infarction. No abnormality is seen affecting the brainstem or cerebellum.  Cerebral hemispheres show symmetric old white matter infarctions in the posterior frontal white matter and an old tiny left posterior frontal cortical infarction. No large vessel territory infarction. No mass lesion, hemorrhage, hydrocephalus or extra-axial collection. Vascular: Major vessels at the base of the brain show flow. Skull and upper  cervical spine: Negative Sinuses/Orbits: Clear/normal Other: None MRA HEAD FINDINGS Both internal carotid arteries are patent through the skull base and siphon regions. The anterior and middle cerebral vessels show flow. There is considerable motion degradation. That could potentially be multiple stenoses in the anterior and middle cerebral arteries. Both vertebral arteries are patent to the basilar. Posterior circulation branch vessels show flow. There could possibly be stenoses within the PCA branches. IMPRESSION: MRI head: No acute finding. 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 MRA head: Study is markedly degraded by motion. No large vessel occlusion. Potential for multiple stenoses in the anterior, middle and posterior cerebral artery territories. Detail markedly limited by motion. Electronically Signed   By: Nelson Chimes M.D.   On: 12/24/2019 22:31   US RENAL  Result Date: 12/24/2019 CLINICAL DATA:  Chronic kidney disease. EXAM: RENAL / URINARY TRACT ULTRASOUND COMPLETE COMPARISON:  CT 12/24/2019 FINDINGS: Right Kidney: Renal measurements: 8.6 x 3.9 x 4.7 cm = volume: 82 mL. Increased renal echogenicity. Left Kidney: Renal measurements: 11.3 x 5.6 x 5.8 cm = volume: 196 mL. Increased renal echogenicity. Bladder: Appears normal for degree of bladder distention. Other: Trace left perinephric fluid identified. Mild limitations secondary to patient's shortness of breath. IMPRESSION: 1. Increased renal echogenicity, suggesting medical renal disease. Mildly small right kidney relative to the left. 2. Trace left perinephric fluid, likely related to renal insufficiency. Electronically Signed   By: Abigail Miyamoto M.D.   On: 12/24/2019 14:49   DG CHEST PORT 1 VIEW  Result Date: 12/25/2019 CLINICAL DATA:  Status post intubation EXAM: PORTABLE CHEST 1 VIEW COMPARISON:  12/24/2019 FINDINGS: Right jugular  central line is again seen and stable. Endotracheal tube is noted 5 cm above the carina. Gastric catheter extends into the stomach. Cardiac shadow is stable. Mild aortic calcifications are seen. The lungs are hyperinflated consistent with COPD. No focal infiltrate is seen. IMPRESSION: Tubes and lines as described above. COPD without acute abnormality. Electronically Signed   By: Inez Catalina M.D.   On: 12/25/2019 01:35   DG Chest Port 1 View  Result Date: 12/24/2019 CLINICAL DATA:  Central line placement EXAM: PORTABLE CHEST 1 VIEW COMPARISON:  12/24/2019 at 2:09 p.m. FINDINGS: 6:10 p.m. Right internal jugular line tip at high SVC, without pneumothorax. Midline trachea. Borderline cardiomegaly. No pleural effusion or pneumothorax. Hyperinflation and interstitial thickening. Mild scarring or subsegmental atelectasis at the left lung base. IMPRESSION: Hyperinflation and chronic interstitial thickening, consistent with COPD/chronic bronchitis. New right internal jugular line, without pneumothorax. Electronically Signed   By: Abigail Miyamoto M.D.   On: 12/24/2019 18:38   DG CHEST PORT 1 VIEW  Result Date: 12/24/2019 CLINICAL DATA:  Acute kidney injury and shortness of breath. EXAM: PORTABLE CHEST 1 VIEW COMPARISON:  11/22/2018 and prior radiographs FINDINGS: Cardiomegaly, pulmonary vascular congestion and mild interstitial opacities noted. Mild bibasilar atelectasis is present. No definite pleural effusion or pneumothorax. No acute bony abnormality noted. IMPRESSION: Cardiomegaly with pulmonary vascular congestion and question mild interstitial edema. Electronically Signed   By: Margarette Canada M.D.   On: 12/24/2019 14:48   Overnight EEG with video  Result Date:  12/26/2019 Lora Havens, MD     12/26/2019  8:42 AM Patient Name:Krishawn Suzan Slick XFG:[id number] Epilepsy Attending:Priyanka [name] Referring Physician/Provider:Dr Karena Addison Aroor Duration: 12/25/2019 1127 to 12/26/2019 0840  Patient history:69 year old  male who presented to the hospital today with acute renal failure and AMS. Had sudden onset of worsened AMS with decreased movement. 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  Level of alertness:awake/lethargic, asleep  AEDs during EEG study: LEV  Technical aspects: This EEG study was done with scalp electrodes positioned according to the 10-20 International system of electrode placement. Electrical activity was acquired at a sampling rate of '500Hz'$  and reviewed with a high frequency filter of '70Hz'$  and a low frequency filter of '1Hz'$ . EEG data were recorded continuously and digitally stored.  DESCRIPTION: During awake state, no clear posterior dominant rhythm was seen.  Sleep was characterized by vertex waves, maximal frontocentral.  EEG showed continuous generalized 3-'5Hz'$  theta-delta slowing.Hyperventilation and photic stimulation were not performed.  ABNORMALITY - Continuous rhythmic slow, generalized  IMPRESSION: This study issuggestive of moderate to severe diffuse encephalopathy, non specific to etiology.No seizures or epileptiform discharges were seen throughout the recording.  EEG appears to be improving compared to previous day. Priyanka [name]   CT HEAD CODE STROKE WO CONTRAST`  Result Date: 12/24/2019 CLINICAL DATA:  Code stroke. Altered mental status. Bilateral lower extremity weakness. EXAM: CT HEAD WITHOUT CONTRAST TECHNIQUE: Contiguous axial images were obtained from the base of the skull through the vertex without intravenous contrast. COMPARISON:  11/20/2018 FINDINGS: Brain: No evidence of accelerated brain atrophy. No evidence of old or acute small or large vessel infarction. No mass lesion, hemorrhage, hydrocephalus or extra-axial collection. Vascular: There is atherosclerotic calcification of the major vessels at the base of the brain. Skull: Negative Sinuses/Orbits: Clear sinuses. Orbits negative. Old  medial wall blowout fracture on the right. Other: None ASPECTS ([place] Stroke Program Early CT Score) - Ganglionic level infarction (caudate, lentiform nuclei, internal capsule, insula, M1-M3 cortex): 7 - Supraganglionic infarction (M4-M6 cortex): 3 Total score (0-10 with 10 being normal): 10 IMPRESSION: 1. No acute finding. Normal appearing brain for age. Extensive atherosclerotic calcification of the major vessels at the base of the brain. 2. ASPECTS is 10 3. These results were communicated to Dr. Cheral Marker at 9:16 pmon 2/28/2021by text page via the [facility] messaging system. Electronically Signed   By: Nelson Chimes M.D.   On: 12/24/2019 21:18    Labs: BMET Recent Labs  Lab 12/24/19 0419 12/24/19 0419 12/24/19 1005 12/24/19 1005 12/24/19 1445 12/24/19 1445 12/24/19 2035 12/24/19 2035 12/24/19 2051 12/24/19 2051 12/25/19 0326 12/25/19 0425 12/25/19 1052 12/25/19 1157 12/25/19 1855 12/25/19 2124 12/26/19 0629  NA 143   < > 141  --  142  --  141  --  138  --  136 140  --   --   --   --  139  K 6.8*   < > 6.3*   < > 6.4*   < > 6.7*   < > 6.4*   < > 4.0 4.2  4.2 3.8 3.8 3.7 4.1 3.5  CL 97*  --  98  --  101  --  95*  --   --   --   --  95*  --   --   --   --  90*  CO2 8*  --  8*  --  10*  --  13*  --   --   --   --  18*  --   --   --   --  28  GLUCOSE 45*  --  254*  --  112*  --  93  --   --   --   --  105*  --   --   --   --  117*  BUN 143*  --  147*  --  149*  --  149*  --   --   --   --  90*  --   --   --   --  120*  CREATININE 14.97*  --  14.45*  --  14.23*  --  13.31*  --   --   --   --  8.87*  --   --   --   --  10.70*  CALCIUM 9.3  --  8.4*  --  8.4*  --  7.8*  --   --   --   --  7.8*  --   --   --   --  7.7*  PHOS  --   --   --   --   --   --   --   --   --   --   --  5.5*  5.4*  --   --   --   --  6.1*   < > = values in this interval not displayed.   CBC Recent Labs  Lab 12/24/19 0419 12/24/19 2051 12/25/19 0326 12/25/19 0425 12/25/19 1300 12/26/19 0628  WBC 8.8  --    --  14.1* 12.5* 11.5*  HGB 15.1   < > 12.9* 13.1 12.5* 12.3*  HCT 46.8   < > 38.0* 37.5* 35.4* 35.6*  MCV 90.9  --   --  85.2 82.1 85.4  PLT 363  --   --  290 261 208   < > = values in this interval not displayed.    Medications:    . albuterol  10 mg Nebulization Once  . amLODipine  5 mg Per Tube Daily  . carvedilol  12.5 mg Per Tube BID WC  . chlorhexidine gluconate (MEDLINE KIT)  15 mL Mouth Rinse BID  . Chlorhexidine Gluconate Cloth  6 each Topical Daily  . guaiFENesin  600 mg Oral BID  . heparin  5,000 Units Subcutaneous Q8H  . insulin aspart  0-15 Units Subcutaneous Q4H  . mouth rinse  15 mL Mouth Rinse 10 times per day  . pantoprazole (PROTONIX) IV  40 mg Intravenous Q12H      Otelia Santee, MD 12/26/2019, 8:58 AM

## 2019-12-27 DIAGNOSIS — N19 Unspecified kidney failure: Secondary | ICD-10-CM

## 2019-12-27 DIAGNOSIS — G9349 Other encephalopathy: Secondary | ICD-10-CM

## 2019-12-27 DIAGNOSIS — J9611 Chronic respiratory failure with hypoxia: Secondary | ICD-10-CM

## 2019-12-27 LAB — RENAL FUNCTION PANEL
Albumin: 2.2 g/dL — ABNORMAL LOW (ref 3.5–5.0)
Anion gap: 17 — ABNORMAL HIGH (ref 5–15)
BUN: 84 mg/dL — ABNORMAL HIGH (ref 8–23)
CO2: 23 mmol/L (ref 22–32)
Calcium: 7.8 mg/dL — ABNORMAL LOW (ref 8.9–10.3)
Chloride: 96 mmol/L — ABNORMAL LOW (ref 98–111)
Creatinine, Ser: 8.5 mg/dL — ABNORMAL HIGH (ref 0.61–1.24)
GFR calc Af Amer: 7 mL/min — ABNORMAL LOW (ref >60)
GFR calc non Af Amer: 6 mL/min — ABNORMAL LOW (ref >60)
Glucose, Bld: 155 mg/dL — ABNORMAL HIGH (ref 70–99)
Phosphorus: 6.8 mg/dL — ABNORMAL HIGH (ref 2.5–4.6)
Potassium: 3.9 mmol/L (ref 3.5–5.1)
Sodium: 136 mmol/L (ref 135–145)

## 2019-12-27 LAB — HEPATIC FUNCTION PANEL
ALT: 26 U/L (ref 0–44)
AST: 25 U/L (ref 15–41)
Albumin: 2.2 g/dL — ABNORMAL LOW (ref 3.5–5.0)
Alkaline Phosphatase: 51 U/L (ref 38–126)
Bilirubin, Direct: 0.1 mg/dL (ref 0.0–0.2)
Indirect Bilirubin: 1.4 mg/dL — ABNORMAL HIGH (ref 0.3–0.9)
Total Bilirubin: 1.5 mg/dL — ABNORMAL HIGH (ref 0.3–1.2)
Total Protein: 5.4 g/dL — ABNORMAL LOW (ref 6.5–8.1)

## 2019-12-27 LAB — CBC
HCT: 32.6 % — ABNORMAL LOW (ref 39.0–52.0)
Hemoglobin: 11.2 g/dL — ABNORMAL LOW (ref 13.0–17.0)
MCH: 28.9 pg (ref 26.0–34.0)
MCHC: 34.4 g/dL (ref 30.0–36.0)
MCV: 84.2 fL (ref 80.0–100.0)
Platelets: 141 10*3/uL — ABNORMAL LOW (ref 150–400)
RBC: 3.87 MIL/uL — ABNORMAL LOW (ref 4.22–5.81)
RDW: 14.3 % (ref 11.5–15.5)
WBC: 10.9 10*3/uL — ABNORMAL HIGH (ref 4.0–10.5)
nRBC: 0 % (ref 0.0–0.2)

## 2019-12-27 LAB — GLUCOSE, CAPILLARY
Glucose-Capillary: 155 mg/dL — ABNORMAL HIGH (ref 70–99)
Glucose-Capillary: 159 mg/dL — ABNORMAL HIGH (ref 70–99)
Glucose-Capillary: 159 mg/dL — ABNORMAL HIGH (ref 70–99)
Glucose-Capillary: 159 mg/dL — ABNORMAL HIGH (ref 70–99)
Glucose-Capillary: 155 mg/dL — ABNORMAL HIGH (ref 70–99)

## 2019-12-27 LAB — APTT: aPTT: >200 seconds (ref 24–36)

## 2019-12-27 LAB — HEPARIN LEVEL (UNFRACTIONATED): Heparin Unfractionated: 1.1 IU/mL — ABNORMAL HIGH (ref 0.30–0.70)

## 2019-12-27 MED ORDER — HEPARIN (PORCINE) 25000 UT/250ML-% IV SOLN
900.0000 [IU]/h | INTRAVENOUS | Status: DC
  Administered 2019-12-27: 900 [IU]/h via INTRAVENOUS
  Filled 2019-12-27: qty 250

## 2019-12-27 MED ORDER — INSULIN ASPART 100 UNIT/ML ~~LOC~~ SOLN
0.0000 [IU] | Freq: Every day | SUBCUTANEOUS | Status: DC
  Administered 2019-12-28: 2 [IU] via SUBCUTANEOUS
  Administered 2019-12-30 – 2019-12-31 (×2): 3 [IU] via SUBCUTANEOUS
  Administered 2020-01-07 – 2020-01-08 (×2): 2 [IU] via SUBCUTANEOUS
  Administered 2020-01-10: 4 [IU] via SUBCUTANEOUS

## 2019-12-27 MED ORDER — CARVEDILOL 6.25 MG PO TABS
6.2500 mg | ORAL_TABLET | Freq: Two times a day (BID) | ORAL | Status: DC
  Administered 2019-12-27 – 2020-01-11 (×27): 6.25 mg via ORAL
  Filled 2019-12-27 (×25): qty 1

## 2019-12-27 MED ORDER — GUAIFENESIN ER 600 MG PO TB12
600.0000 mg | ORAL_TABLET | Freq: Two times a day (BID) | ORAL | Status: DC
  Administered 2019-12-27 – 2020-01-11 (×31): 600 mg via ORAL
  Filled 2019-12-27 (×32): qty 1

## 2019-12-27 MED ORDER — INSULIN GLARGINE 100 UNIT/ML ~~LOC~~ SOLN
10.0000 [IU] | Freq: Every day | SUBCUTANEOUS | Status: DC
  Administered 2019-12-27 – 2019-12-28 (×2): 10 [IU] via SUBCUTANEOUS
  Filled 2019-12-27 (×3): qty 0.1

## 2019-12-27 MED ORDER — ENSURE ENLIVE PO LIQD
237.0000 mL | Freq: Three times a day (TID) | ORAL | Status: DC
  Administered 2019-12-27 – 2020-01-02 (×10): 237 mL via ORAL

## 2019-12-27 MED ORDER — ALTEPLASE 2 MG IJ SOLR
4.0000 mg | Freq: Once | INTRAMUSCULAR | Status: AC
  Administered 2019-12-27: 4 mg
  Filled 2019-12-27: qty 4

## 2019-12-27 MED ORDER — AMLODIPINE BESYLATE 5 MG PO TABS
5.0000 mg | ORAL_TABLET | Freq: Every day | ORAL | Status: DC
  Administered 2019-12-27 – 2020-01-11 (×15): 5 mg via ORAL
  Filled 2019-12-27 (×15): qty 1

## 2019-12-27 MED ORDER — UMECLIDINIUM-VILANTEROL 62.5-25 MCG/INH IN AEPB
1.0000 | INHALATION_SPRAY | Freq: Every day | RESPIRATORY_TRACT | Status: DC
  Administered 2019-12-27 – 2020-01-11 (×16): 1 via RESPIRATORY_TRACT
  Filled 2019-12-27 (×3): qty 14

## 2019-12-27 MED ORDER — HEPARIN SODIUM (PORCINE) 1000 UNIT/ML IJ SOLN
INTRAMUSCULAR | Status: AC
  Filled 2019-12-27: qty 4

## 2019-12-27 MED ORDER — SODIUM CHLORIDE 0.9 % IV SOLN
INTRAVENOUS | Status: DC | PRN
  Administered 2019-12-27 – 2019-12-29 (×2): 250 mL via INTRAVENOUS

## 2019-12-27 MED ORDER — HEPARIN (PORCINE) 25000 UT/250ML-% IV SOLN
800.0000 [IU]/h | INTRAVENOUS | Status: DC
  Administered 2019-12-27: 700 [IU]/h via INTRAVENOUS

## 2019-12-27 MED ORDER — LEVETIRACETAM 500 MG PO TABS
500.0000 mg | ORAL_TABLET | Freq: Two times a day (BID) | ORAL | Status: DC
  Administered 2019-12-27 – 2020-01-11 (×31): 500 mg via ORAL
  Filled 2019-12-27 (×31): qty 1

## 2019-12-27 MED ORDER — PANTOPRAZOLE SODIUM 40 MG PO TBEC
40.0000 mg | DELAYED_RELEASE_TABLET | Freq: Every day | ORAL | Status: AC
  Administered 2019-12-27 – 2019-12-30 (×4): 40 mg via ORAL
  Filled 2019-12-27 (×4): qty 1

## 2019-12-27 MED ORDER — INSULIN ASPART 100 UNIT/ML ~~LOC~~ SOLN
0.0000 [IU] | Freq: Three times a day (TID) | SUBCUTANEOUS | Status: DC
  Administered 2019-12-27 (×2): 2 [IU] via SUBCUTANEOUS
  Administered 2019-12-28: 1 [IU] via SUBCUTANEOUS
  Administered 2019-12-29 – 2019-12-30 (×3): 3 [IU] via SUBCUTANEOUS
  Administered 2019-12-31: 9 [IU] via SUBCUTANEOUS
  Administered 2019-12-31: 3 [IU] via SUBCUTANEOUS
  Administered 2020-01-01 – 2020-01-02 (×2): 1 [IU] via SUBCUTANEOUS
  Administered 2020-01-02: 3 [IU] via SUBCUTANEOUS
  Administered 2020-01-02: 2 [IU] via SUBCUTANEOUS
  Administered 2020-01-03: 1 [IU] via SUBCUTANEOUS
  Administered 2020-01-04 (×2): 3 [IU] via SUBCUTANEOUS
  Administered 2020-01-05: 2 [IU] via SUBCUTANEOUS
  Administered 2020-01-06: 3 [IU] via SUBCUTANEOUS
  Administered 2020-01-07: 5 [IU] via SUBCUTANEOUS
  Administered 2020-01-07: 3 [IU] via SUBCUTANEOUS
  Administered 2020-01-07: 1 [IU] via SUBCUTANEOUS
  Administered 2020-01-08: 2 [IU] via SUBCUTANEOUS
  Administered 2020-01-08: 7 [IU] via SUBCUTANEOUS
  Administered 2020-01-09 – 2020-01-10 (×3): 3 [IU] via SUBCUTANEOUS
  Administered 2020-01-10 (×2): 1 [IU] via SUBCUTANEOUS
  Administered 2020-01-11: 2 [IU] via SUBCUTANEOUS

## 2019-12-27 NOTE — Progress Notes (Signed)
NEW ADMISSION NOTE New Admission Note: Transfer Patient from 35M  Arrival Method: Wheelchair  Mental Orientation: Alert  Telemetry: Assessment: Completed Skin: IV: RIJ leaking  Pain: Tubes: Safety Measures: Safety Fall Prevention Plan has been given, discussed and signed Admission: Completed [facility] Orientation: Patient has been orientated to the room, unit and staff.  Family:  Orders have been reviewed and implemented. Will continue to monitor the patient. Call light has been placed within reach and bed alarm has been activated.   Shela Commons, RN

## 2019-12-27 NOTE — Progress Notes (Addendum)
BFR decreased to 300 HD catheter positional with pt. Respirations. Lines reversed. Dr. Augustin Coupe notified. Pt. stable

## 2019-12-27 NOTE — Progress Notes (Signed)
Right IJ HD cath positional, treatment interruptions when pt. Cough. BFR decreased. Pt. stable

## 2019-12-27 NOTE — Progress Notes (Signed)
ANTICOAGULATION CONSULT NOTE - Follow Up Consult  Pharmacy Consult for heparin Indication: atrial fibrillation  No Known Allergies  Patient Measurements: Height: 5\' 11"  (180.3 cm) Weight: 155 lb 10.3 oz (70.6 kg) IBW/kg (Calculated) : 75.3  Vital Signs: Temp: 98.4 F (36.9 C) (03/03 1200) Temp Source: Oral (03/03 1200) BP: 146/64 (03/03 1200) Pulse Rate: 58 (03/03 1200)  Labs: Recent Labs    12/25/19 0425 12/25/19 1157 12/25/19 1300 12/25/19 1300 12/26/19 0628 12/26/19 0629 12/26/19 1700 12/27/19 0344  HGB   < >  --  12.5*   < > 12.3*  --   --  11.2*  HCT   < >  --  35.4*  --  35.6*  --   --  32.6*  PLT   < >  --  261  --  208  --   --  141*  CREATININE   < >  --   --   --   --  10.70* 4.24* 8.50*  CKTOTAL  --  519*  --   --   --   --   --   --    < > = values in this interval not displayed.    Estimated Creatinine Clearance: 8.4 mL/min (A) (by C-G formula based on SCr of 8.5 mg/dL (H)).  Assessment: CC/HPI: 69 yo m presenting with nausea, intermittent vomiting, and diarrhea x 1 week, found with acute renal failure   PMH: COPD PVD DM afib  Anticoag: Apixaban pta hx Afib, last dose 2/27, some concern for coffee ground emesis (resolved) - was on sq heparin  GI/Nutrition: CT abd pelvis neg, PPI IV bid Unintentional statin OD - crestor 40 BID about 1 month  Renal: AoCKD3 (BL 1.4-1.7) - SCr down 8.87 but s/p HD 2/28, monitor trends off HD. Renal following for IHD - none planned 3/1. K down to 3.8, HCO3 18 - on bicarb @ 150/hr - d/cing.   Heme/Onc: H&H 11.2/32.6, Pl 141  Goal of Therapy:  Heparin level 0.3 - 0.5 units/ml Monitor platelets by anticoagulation protocol: Yes   Plan:  DC sq hep Add heparin 900 units/hr Initial HL 1600 Daily hep lvl cbc Watch for bleeding w recent coffee ground emesis F/u if stable on above and return to apixaban   Barth Kirks, PharmD, BCPS, BCCCP Clinical Pharmacist [phone]  Please check AMION for all [place]  numbers  12/27/2019 1:08 PM

## 2019-12-27 NOTE — Progress Notes (Signed)
NAME:  Kyle [name], MRN:  [id number], DOB:  12/24/1950, LOS: 3 ADMISSION DATE:  12/24/2019, CONSULTATION DATE:  12/24/19 REFERRING MD:  ER, CHIEF COMPLAINT:  Abd pain   Brief History   69 year old man with a history of heavy smoking, COPD, peripheral vascular disease presenting with nausea, intermittent vomiting, abd cramping/pain and diarrhea for 1 week found to have acute renal failure, hyperkalemia and severe lactic acidosis of unclear etiology.  Past Medical History  COPD on home oxygen 5L Severe peripheral vascular disease History of heavy smoking Insulin-dependent diabetes  Significant Hospital Events   2/28 Admitted 3/01 Intubated overnight due to worsening mental status 3/02 Extubated  Consults:  Nephrology, Hospitalist, PCCM  Procedures:  R IJ HD 2/28 >>  ETT 3/1 >> 3/2  Significant Diagnostic Tests:   CT Renal 2/28 >> No acute findings in the abdomen or pelvis.  Minimal left lower lobe collapse/consolidation.  5 mm right lower lobe pulmonary nodule.    MRI Brain 2/28 >> no acute findings  MRA Brain 2/28 >> motion degraded, no acute findings  EEG 3/2 >> suggestive of moderate to severe diffuse encephalopathy, non-specific etiology  Micro Data:  COVID 2/28 >> negative  Antimicrobials:     Interim history/subjective:  Extubated yesterday. No complaints. Denies shortness of breath or chest pain. Follows commands  Objective   Blood pressure (!) 123/58, pulse (!) 56, temperature 98.2 F (36.8 C), temperature source Oral, resp. rate 12, height 5\' 11"  (1.803 m), weight 68.6 kg, SpO2 97 %.    Vent Mode: PSV;CPAP FiO2 (%):  [40 %] 40 % Set Rate:  [15 bmp] 15 bmp Vt Set:  [500 mL] 500 mL PEEP:  [8 cmH20] 8 cmH20 Pressure Support:  [5 cmH20] 5 cmH20   Intake/Output Summary (Last 24 hours) at 12/27/2019 0750 Last data filed at 12/27/2019 0600 Gross per 24 hour  Intake 1152.77 ml  Output 2195 ml  Net -1042.23 ml   Filed Weights   12/24/19 2245 12/26/19 1330  12/26/19 1717  Weight: 72 kg 70 kg 68.6 kg   Physical Exam: General: Appears older than stated age, on nasal cannula, appears comfortable, no acute distress HENT: [place], AT, OP clear, MMM Eyes: EOMI, no scleral icterus Respiratory: Clear to auscultation bilaterally.  No crackles, wheezing or rales Cardiovascular: RRR, -M/R/G, no JVD GI: BS+, soft, nontender Extremities:-Edema,-tenderness Neuro: AAO x4, CNII-XII grossly intact Psych: Normal mood, normal affect GU: Foley in place  Resolved Hospital Problem list      Assessment & Plan:   Acute Metabolic Encephalopathy - imporving Likely metabolic derangements in setting of AKI with significant uremia. Ammonia 56. Required intubation on 3/1 for encephalopathy. CT head and MRI negative for acute findings. ?seizure however EEG negative.  --HD per nephrology --Appreciate Neuro input. Continue Keppra BID  Acute on Chronic Hypoxemic Respiratory Failure - improved. On home O2 COPD exacerbation On 5L via [place] at home --Continue steroids x 5 days --Resume home Anoro --PRN Duonebs --Supplemental oxygen for goal SpO2 88-92%  Coffee-Ground Emesis - no further episodes Hemoglobin stable --Daily CBC --Decrease PPI to daily --Cautiously resume systemic anticoagulation  AKI with AGA Stage III CKD Lactic Acidosis - improved --HD per Nephrology --Monitor UOP/BMP --Avoid nephrotoxic agents --Foley in place  N/V/D with Abdominal Pain - resolved Diarrhea. Possible ischemic colitis in setting of hx of PAD however no acute findings on CT abdomen/renal stone study --DC NS at 50 ml/hr   Atrial Fibrillation PVD Dilated cardiomyopathy Aortic stenosis HTN On apixiban at home  for AF --Cautiously resume systemic anticoagulation with IV heparin in setting of prior GIB --Reduce coreg due to bradycardia --Continue norvasc --Hold lasix and quinapril  IDDM Hypoglycemic on admission -SSI, moderate scale   At Risk Malnutrition  --Advance  diet  RLL Lung nodule, 40mm --will need follow up CT imaging in 12 months  Best practice:  Diet: Restart diet Pain/Anxiety/Delirium protocol (if indicated): -- VAP protocol (if indicated): -- DVT prophylaxis: SCDs, heparin.  Systemic anticoagulation GI prophylaxis: PPI Glucose control: SSI Mobility: BR Code Status: Full Code Family Communication: Updated wife 3/2 Disposition: ICU  Labs   CBC: Recent Labs  Lab 12/24/19 0419 12/24/19 2051 12/25/19 0326 12/25/19 0425 12/25/19 1300 12/26/19 0628 12/27/19 0344  WBC 8.8  --   --  14.1* 12.5* 11.5* 10.9*  HGB 15.1   < > 12.9* 13.1 12.5* 12.3* 11.2*  HCT 46.8   < > 38.0* 37.5* 35.4* 35.6* 32.6*  MCV 90.9  --   --  85.2 82.1 85.4 84.2  PLT 363  --   --  290 261 208 141*   < > = values in this interval not displayed.    Basic Metabolic Panel: Recent Labs  Lab 12/24/19 2035 12/24/19 2051 12/25/19 0326 12/25/19 0326 12/25/19 0425 12/25/19 1052 12/25/19 1855 12/25/19 2124 12/26/19 0629 12/26/19 1700 12/27/19 0344  NA 141   < > 136  --  140  --   --   --  139 136 136  K 6.7*   < > 4.0   < > 4.2  4.2   < > 3.7 4.1 3.5 3.0* 3.9  CL 95*  --   --   --  95*  --   --   --  90* 93* 96*  CO2 13*  --   --   --  18*  --   --   --  28 26 23   GLUCOSE 93  --   --   --  105*  --   --   --  117* 126* 155*  BUN 149*  --   --   --  90*  --   --   --  120* 36* 84*  CREATININE 13.31*  --   --   --  8.87*  --   --   --  10.70* 4.24* 8.50*  CALCIUM 7.8*  --   --   --  7.8*  --   --   --  7.7* 8.4* 7.8*  MG  --   --   --   --  2.0  --   --   --   --   --   --   PHOS  --   --   --   --  5.5*  5.4*  --   --   --  6.1*  --  6.8*   < > = values in this interval not displayed.   GFR: Estimated Creatinine Clearance: 8.2 mL/min (A) (by C-G formula based on SCr of 8.5 mg/dL (H)). Recent Labs  Lab 12/24/19 0419 12/24/19 0600 12/24/19 0810 12/25/19 0425 12/25/19 1300 12/25/19 1630 12/25/19 1855 12/26/19 0628 12/27/19 0344  WBC   < >  --    --  14.1* 12.5*  --   --  11.5* 10.9*  LATICACIDVEN  --  10.2* 9.3*  --   --  2.8* 2.8*  --   --    < > = values in this interval not displayed.  Liver Function Tests: Recent Labs  Lab 12/24/19 0419 12/25/19 0425 12/26/19 0628 12/26/19 0629 12/27/19 0344  AST 36 37 31  --  25  ALT 28 27 30   --  26  ALKPHOS 65 53 55  --  51  BILITOT 2.5* 1.1 1.5*  --  1.5*  PROT 7.7 6.0* 5.5*  --  5.4*  ALBUMIN 3.0* 2.5*  2.5* 2.3* 2.3* 2.2*  2.2*   Recent Labs  Lab 12/24/19 0419  LIPASE 71*   Recent Labs  Lab 12/25/19 1052  AMMONIA 56*    ABG    Component Value Date/Time   PHART 7.457 (H) 12/25/2019 0326   PCO2ART 27.8 (L) 12/25/2019 0326   PO2ART 215.0 (H) 12/25/2019 0326   HCO3 19.8 (L) 12/25/2019 0326   TCO2 21 (L) 12/25/2019 0326   ACIDBASEDEF 3.0 (H) 12/25/2019 0326   O2SAT 100.0 12/25/2019 0326     Coagulation Profile: No results for input(s): INR, PROTIME in the last 168 hours.  Cardiac Enzymes: Recent Labs  Lab 12/24/19 0419 12/25/19 1157  CKTOTAL 321 519*    HbA1C: Hgb A1c MFr Bld  Date/Time Value Ref Range Status  12/24/2019 08:10 AM 8.0 (H) 4.8 - 5.6 % Final    Comment:    (NOTE) Pre diabetes:          5.7%-6.4% Diabetes:              >6.4% Glycemic control for   <7.0% adults with diabetes   11/30/2019 09:37 AM 7.6 (H) 4.8 - 5.6 % Final    Comment:             Prediabetes: 5.7 - 6.4          Diabetes: >6.4          Glycemic control for adults with diabetes: <7.0     CBG: Recent Labs  Lab 12/26/19 1507 12/26/19 1913 12/26/19 2311 12/27/19 0321 12/27/19 0717  GLUCAP 140* 141* 165* 155* 159*   Critical care time: 35 minutes    Noe Gens, MSN, NP-C [facility] 12/27/2019, 7:50 AM   Please see [email or web address] for pager details.

## 2019-12-27 NOTE — Progress Notes (Addendum)
Initial Nutrition Assessment  DOCUMENTATION CODES:   Not applicable  INTERVENTION:   Downgrade diet to dysphagia 1 (pureed) to offer patient soft foods that he can eat without his dentures.   Ensure Enlive po TID, each supplement provides 350 kcal and 20 grams of protein.  Patient to have wife bring in dentures.   NUTRITION DIAGNOSIS:   Inadequate oral intake related to inability to eat as evidenced by NPO status.  GOAL:   Patient will meet greater than or equal to 90% of their needs  MONITOR:   Diet advancement, PO intake, Labs  ASSESSMENT:   69 yo male admitted with abdominal pain, AKI, hyperkalemia. PMH includes HTN, cardiomyopathy, heavy smoking, COPD on home oxygen, severe PVD, insulin dependent diabetes.   Discussed patient in ICU rounds and with RN today.  Receiving HD in room today.  Diet was advanced to renal this morning after breakfast.  Patient reports that he has a good appetite and eats well at home. He usually weighs 158-160 lbs and has always been small. No recent weight loss that he knows of. He drinks Boost at home. He currently does not have his dentures (his wife took them home) and states that he cannot eat without them. He thinks he could eat applesauce, ice cream, PO supplements. RD to downgrade diet to pureed foods so it is easier for him to eat until his wife brings his dentures back in.   Labs reviewed. BUN 84 (H), creat 8.5 (H), Phos 6.8 (H) CBG's: [phone]  Medications reviewed and include Novolog, Lantus, Protonix, Keppra.  Diet Order:   Diet Order            DIET - DYS 1 Room service appropriate? Yes; Fluid consistency: Thin  Diet effective now              EDUCATION NEEDS:   Not appropriate for education at this time  Skin:  Skin Assessment: Reviewed RN Assessment  Last BM:  PTA  Height:   Ht Readings from Last 1 Encounters:  12/24/19 5\' 11"  (1.803 m)    Weight:   Wt Readings from Last 1 Encounters:  12/27/19 70.6  kg    Ideal Body Weight:  78.2 kg  BMI:  Body mass index is 21.71 kg/m.  Estimated Nutritional Needs:   Kcal:  2000-2200  Protein:  90-110 gm  Fluid:  1 L + UOP    Molli Barrows, RD, LDN, CNSC Please refer to Amion for contact information.

## 2019-12-27 NOTE — Plan of Care (Signed)
  Problem: Education: Goal: Knowledge of General Education information will improve Description: Including pain rating scale, medication(s)/side effects and non-pharmacologic comfort measures 12/27/2019 0625 by Clint Bolder, RN Outcome: Progressing 12/27/2019 0625 by Clint Bolder, RN Outcome: Progressing   Problem: Health Behavior/Discharge Planning: Goal: Ability to manage health-related needs will improve 12/27/2019 0625 by Clint Bolder, RN Outcome: Progressing 12/27/2019 0625 by Clint Bolder, RN Outcome: Progressing   Problem: Clinical Measurements: Goal: Ability to maintain clinical measurements within normal limits will improve 12/27/2019 0625 by Clint Bolder, RN Outcome: Progressing 12/27/2019 0625 by Clint Bolder, RN Outcome: Progressing Goal: Will remain free from infection 12/27/2019 0625 by Clint Bolder, RN Outcome: Progressing 12/27/2019 0625 by Clint Bolder, RN Outcome: Progressing Goal: Diagnostic test results will improve 12/27/2019 0625 by Clint Bolder, RN Outcome: Progressing 12/27/2019 0625 by Clint Bolder, RN Outcome: Progressing Goal: Respiratory complications will improve 12/27/2019 0625 by Clint Bolder, RN Outcome: Progressing 12/27/2019 0625 by Clint Bolder, RN Outcome: Progressing Goal: Cardiovascular complication will be avoided 12/27/2019 0625 by Clint Bolder, RN Outcome: Progressing 12/27/2019 0625 by Clint Bolder, RN Outcome: Progressing   Problem: Activity: Goal: Risk for activity intolerance will decrease 12/27/2019 0625 by Clint Bolder, RN Outcome: Progressing 12/27/2019 0625 by Clint Bolder, RN Outcome: Progressing   Problem: Nutrition: Goal: Adequate nutrition will be maintained 12/27/2019 0625 by Clint Bolder, RN Outcome: Progressing 12/27/2019 0625 by Clint Bolder, RN Outcome: Progressing   Problem: Coping: Goal: Level of anxiety will decrease 12/27/2019 0625 by Clint Bolder, RN Outcome: Progressing 12/27/2019 0625 by Clint Bolder, RN Outcome: Progressing    Problem: Pain Managment: Goal: General experience of comfort will improve 12/27/2019 0625 by Clint Bolder, RN Outcome: Progressing 12/27/2019 0625 by Clint Bolder, RN Outcome: Progressing   Problem: Safety: Goal: Ability to remain free from injury will improve 12/27/2019 0625 by Clint Bolder, RN Outcome: Progressing 12/27/2019 0625 by Clint Bolder, RN Outcome: Progressing   Problem: Skin Integrity: Goal: Risk for impaired skin integrity will decrease 12/27/2019 0625 by Clint Bolder, RN Outcome: Progressing 12/27/2019 0625 by Clint Bolder, RN Outcome: Progressing   Problem: Elimination: Goal: Will not experience complications related to urinary retention 12/27/2019 0625 by Clint Bolder, RN Outcome: Not Progressing 12/27/2019 0625 by Clint Bolder, RN Outcome: Not Progressing  Pt continues to have minimal urine output throughout the shift, despite gentle hydration via IVF. Pt has no complaints of pain or bladder distention, no urge to urinate. Bladder scan showed 53mL

## 2019-12-27 NOTE — Progress Notes (Signed)
PCCM Progress Note  Patient extubated yesterday and stable on home 5L O2. No further plans for CRRT per nephrology.   Will transfer to floor and contact [facility] for pick up on 3/4.  Rodman Pickle, M.D. [facility] Pulmonary/Critical Care Medicine 12/27/2019 2:25 PM

## 2019-12-27 NOTE — Plan of Care (Signed)
  Problem: Pain Managment: Goal: General experience of comfort will improve Outcome: Progressing   

## 2019-12-27 NOTE — Progress Notes (Signed)
Report given to 43M RN for patient transfer for floor.

## 2019-12-27 NOTE — Progress Notes (Signed)
ANTICOAGULATION CONSULT NOTE - Follow Up Consult  Pharmacy Consult for Heparin Indication: atrial fibrillation  No Known Allergies  Patient Measurements: Height: 5\' 11"  (180.3 cm) Weight: 155 lb 10.3 oz (70.6 kg) IBW/kg (Calculated) : 75.3  Heparin Dosing Weight: 70.6 kg  Vital Signs: Temp: 98.3 F (36.8 C) (03/03 1703) Temp Source: Oral (03/03 1703) BP: 142/62 (03/03 1703) Pulse Rate: 54 (03/03 1703)  Labs: Recent Labs    12/25/19 0425 12/25/19 1157 12/25/19 1300 12/25/19 1300 12/26/19 0628 12/26/19 0629 12/26/19 1700 12/27/19 0344 12/27/19 1628  HGB   < >  --  12.5*   < > 12.3*  --   --  11.2*  --   HCT   < >  --  35.4*  --  35.6*  --   --  32.6*  --   PLT   < >  --  261  --  208  --   --  141*  --   HEPARINUNFRC  --   --   --   --   --   --   --   --  1.10*  CREATININE   < >  --   --   --   --  10.70* 4.24* 8.50*  --   CKTOTAL  --  519*  --   --   --   --   --   --   --    < > = values in this interval not displayed.    Estimated Creatinine Clearance: 8.4 mL/min (A) (by C-G formula based on SCr of 8.5 mg/dL (H)).  Assessment: 69 yr old male presented to N/V/D X 1 week, found with acute renal failure. Pt has hx of a fib, for which he was taking apixaban PTA (last dose on 2/27, due to concern for coffee ground emesis, which has resolved; pt was on SQ heparin). Pharmacy was consulted to dose heparin for a fib.  H/H 08/28/31.6, platelets 141 (trending down)   Heparin level ~7.5 hrs after starting heparin infusion at 900 units/hr was 1.10 units/ml, which is above the goal range for this patient. Per RN, pt's IV in IJ has been moved to peripheral site; RN reported some bleeding at IJ site.  Goal of Therapy:  Heparin level 0.3 - 0.5 units/ml Monitor platelets by anticoagulation protocol: Yes   Plan:  Hold heparin infusion X 1 hr, then restart heparin infusion at 700 units/hr Check heparin level 8 hrs after restarting heparin infusion Monitor daily heparin level,  CBC Monitor for signs/symptoms of bleeding F/U ability to return to apixaban  Gillermina Hu, PharmD, BCPS, [facility] Clinical Pharmacist 12/27/2019 5:59 PM

## 2019-12-27 NOTE — Progress Notes (Addendum)
[facility] Progress Note   69 y.o.year-old with h/o pulm HTN, PAD (sp aorto-bifem bypass '16), HTN, DCM EF 20-25% (remote echo), COPD presented w/ abd pain , nausea/ vomiting and diarrhea x1wk + mid upper abd pain. Has COPD on 4L home O2. In ED labs showed creat 14, K 6.3, on lasix and ace inhibitor at home. Pt admitted to CCM. Asked to see for AKI; cr 1.47 on 2/4/21w/ BLcr from Jun 2020-feb 2021 1.4- 1.7.    Assessment/ Plan:   1. AoCKD 3 - in setting of vol depletion/ vomiting/ diarrhea and ACEi. B/l creat is 1.5.Txfor hyperkalemia and po K+ lowering meds; K better now. No indication for HD; would leave the cath in till Wed to ensure improving trend. Continue  holdingacei.  - unclear why renal function was improving yest and now worsening.  - will check bladder scan to ensure foley doesn't need to be flushed or changed (d/w nurse) as he only voided 77ml overnight.  - iHD on 3/2 with net UF 2L - no e/o recovery -> plan on next HD today bec of staffing issues tomorrow and then will reassess daily. If no e/o recovery by the weekend will convert to [facility] early  Next week.  2. DM on insulin 3. Vol depletion - getting IVF's w/ bicarb, sp 1L NS. BP'shigher  4. Lactic / metabolic acidosis - severe but now resolved. +diarrhea. 5. COPD - on homeO2, currently intubated 3/1  Subjective:   More awake now and interactive. No events overnight    Objective:   BP 136/62   Pulse (!) 54   Temp 98.2 F (36.8 C) (Oral)   Resp 12   Ht 5\' 11"  (1.803 m)   Wt 68.6 kg   SpO2 97%   BMI 21.09 kg/m   Intake/Output Summary (Last 24 hours) at 12/27/2019 [id number] Last data filed at 12/27/2019 0600 Gross per 24 hour  Intake 1102.8 ml  Output 2175 ml  Net -1072.2 ml   Weight change:   Physical Exam: Genin bed in ICU,converses now No rash, cyanosis or gangrene No jvd or bruits Chest clear bilatto bases, no rales RRR no MRG Abd soft ntnd no massor ascites Ext + LEedema,  no wounds or ulcers Access: [email or web address]  Imaging: EEG  Result Date: 12/25/2019 Lora Havens, MD     12/25/2019  1:52 PM Patient Name: Kyle [name] MRN: [id number] Epilepsy Attending: Lora Havens Referring Physician/Provider: Dr Karena Addison Aroor Date: 12/25/2019 Duration: 24.44 mins  Patient history: 69 year old male who presented to the hospital today with acute renal failure and AMS. Had sudden onset of worsened AMS with decreased movement. MRI Brain showed no acute finding. 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  Level of alertness: comatose  AEDs during EEG study: None  Technical aspects: This EEG study was done with scalp electrodes positioned according to the 10-20 International system of electrode placement. Electrical activity was acquired at a sampling rate of 500Hz  and reviewed with a high frequency filter of 70Hz  and a low frequency filter of 1Hz . EEG data were recorded continuously and digitally stored.  DESCRIPTION:  EEG showed continuous generalized and lateralized right hemisphere 2-3hz  rhythmic low amplitude delta slowing. Intermittent sharply contoured, generalized, maximal bifrontal 4-5hz  theta slowing was also noted. EEG was reactive to noxious stimulation.  Hyperventilation and photic stimulation were not performed.  ABNORMALITY - Continuous rhythmic slow, generalized  IMPRESSION: This study is suggestive of profound diffuse encephalopathy, non  specific to etiology.  No seizures or epileptiform discharges were seen throughout the recording. EEG appeared similar to last night. Priyanka [name]   Overnight EEG with video  Result Date: 12/26/2019 Lora Havens, MD     12/26/2019  3:29 PM Patient Name:Oberon WISAM FAHNESTOCK [id number] Epilepsy Attending:Priyanka [name] Referring Physician/Provider:Dr Karena Addison Aroor Duration: 12/25/2019 1127 to 12/26/2019 1054  Patient history:69 year old male who presented to the hospital today with acute  renal failure and AMS. Had sudden onset of worsened AMS with decreased movement. 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  Level of alertness:awake/lethargic, asleep  AEDs during EEG study: LEV  Technical aspects: This EEG study was done with scalp electrodes positioned according to the 10-20 International system of electrode placement. Electrical activity was acquired at a sampling rate of 500Hz  and reviewed with a high frequency filter of 70Hz  and a low frequency filter of 1Hz . EEG data were recorded continuously and digitally stored.  DESCRIPTION: During awake state, no clear posterior dominant rhythm was seen.  Sleep was characterized by vertex waves, maximal frontocentral.  EEG showed continuous generalized 3-5Hz  theta-delta slowing.Hyperventilation and photic stimulation were not performed.  ABNORMALITY - Continuous rhythmic slow, generalized  IMPRESSION: This study issuggestive of moderate to severe diffuse encephalopathy, non specific to etiology.No seizures or epileptiform discharges were seen throughout the recording.  EEG appears to be improving compared to previous day. [place]: BMET Recent [employer] 12/24/19 1005 12/24/19 1005 12/24/19 1445 12/24/19 1445 12/24/19 2035 12/24/19 2035 12/24/19 2051 12/24/19 2051 12/25/19 0326 12/25/19 0326 12/25/19 0425 12/25/19 0425 12/25/19 1052 12/25/19 1157 12/25/19 1855 12/25/19 2124 12/26/19 0629 12/26/19 1700 12/27/19 0344  NA 141   < > 142   < > 141  --  138  --  136  --  140  --   --   --   --   --  139 136 136  K 6.3*   < > 6.4*   < > 6.7*   < > 6.4*   < > 4.0   < > 4.2  4.2   < > 3.8 3.8 3.7 4.1 3.5 3.0* 3.9  CL 98  --  101  --  95*  --   --   --   --   --  95*  --   --   --   --   --  90* 93* 96*  CO2 8*  --  10*  --  13*  --   --   --   --   --  18*  --   --   --   --   --  28 26 23   GLUCOSE 254*  --  112*  --  93  --    --   --   --   --  105*  --   --   --   --   --  117* 126* 155*  BUN 147*  --  149*  --  149*  --   --   --   --   --  90*  --   --   --   --   --  120* 36* 84*  CREATININE 14.45*  --  14.23*  --  13.31*  --   --   --   --   --  8.87*  --   --   --   --   --  10.70* 4.24* 8.50*  CALCIUM 8.4*  --  8.4*  --  7.8*  --   --   --   --   --  7.8*  --   --   --   --   --  7.7* 8.4* 7.8*  PHOS  --   --   --   --   --   --   --   --   --   --  5.5*  5.4*  --   --   --   --   --  6.1*  --  6.8*   < > = values in this interval not displayed.   CBC Recent Labs  Lab 12/25/19 0425 12/25/19 1300 12/26/19 0628 12/27/19 0344  WBC 14.1* 12.5* 11.5* 10.9*  HGB 13.1 12.5* 12.3* 11.2*  HCT 37.5* 35.4* 35.6* 32.6*  MCV 85.2 82.1 85.4 84.2  PLT 290 261 208 141*    Medications:    . amLODipine  5 mg Oral Daily  . carvedilol  6.25 mg Oral BID WC  . Chlorhexidine Gluconate Cloth  6 each Topical Daily  . guaiFENesin  600 mg Oral BID  . insulin aspart  0-5 Units Subcutaneous QHS  . insulin aspart  0-9 Units Subcutaneous TID WC  . insulin glargine  10 Units Subcutaneous Daily  . levETIRAcetam  500 mg Oral BID  . pantoprazole  40 mg Oral Daily  . umeclidinium-vilanterol  1 puff Inhalation Daily      Otelia Santee, MD 12/27/2019, 8:52 AM

## 2019-12-27 NOTE — Progress Notes (Signed)
Dr. Augustin Coupe telephoned again due to right IJ HD catheter not functioning properly machine interruption aproximately every 5-15 minutes due to increased arterial pressure. Dr. Augustin Coupe in to see pt. Orders to stop HD tx and tpa lumens

## 2019-12-28 ENCOUNTER — Inpatient Hospital Stay (HOSPITAL_COMMUNITY): Payer: Medicare HMO

## 2019-12-28 DIAGNOSIS — G9341 Metabolic encephalopathy: Secondary | ICD-10-CM

## 2019-12-28 DIAGNOSIS — I10 Essential (primary) hypertension: Secondary | ICD-10-CM

## 2019-12-28 HISTORY — PX: IR US GUIDE VASC ACCESS RIGHT: IMG2390

## 2019-12-28 HISTORY — PX: IR FLUORO GUIDE CV LINE RIGHT: IMG2283

## 2019-12-28 LAB — RENAL FUNCTION PANEL
Albumin: 2.1 g/dL — ABNORMAL LOW (ref 3.5–5.0)
Anion gap: 11 (ref 5–15)
BUN: 66 mg/dL — ABNORMAL HIGH (ref 8–23)
CO2: 27 mmol/L (ref 22–32)
Calcium: 7.6 mg/dL — ABNORMAL LOW (ref 8.9–10.3)
Chloride: 99 mmol/L (ref 98–111)
Creatinine, Ser: 6.85 mg/dL — ABNORMAL HIGH (ref 0.61–1.24)
GFR calc Af Amer: 9 mL/min — ABNORMAL LOW (ref >60)
GFR calc non Af Amer: 8 mL/min — ABNORMAL LOW (ref >60)
Glucose, Bld: 156 mg/dL — ABNORMAL HIGH (ref 70–99)
Phosphorus: 2.8 mg/dL (ref 2.5–4.6)
Potassium: 3.8 mmol/L (ref 3.5–5.1)
Sodium: 137 mmol/L (ref 135–145)

## 2019-12-28 LAB — CBC
HCT: 30.3 % — ABNORMAL LOW (ref 39.0–52.0)
Hemoglobin: 10.3 g/dL — ABNORMAL LOW (ref 13.0–17.0)
MCH: 29.4 pg (ref 26.0–34.0)
MCHC: 34 g/dL (ref 30.0–36.0)
MCV: 86.6 fL (ref 80.0–100.0)
Platelets: 119 10*3/uL — ABNORMAL LOW (ref 150–400)
RBC: 3.5 MIL/uL — ABNORMAL LOW (ref 4.22–5.81)
RDW: 14.3 % (ref 11.5–15.5)
WBC: 9.5 10*3/uL (ref 4.0–10.5)
nRBC: 0 % (ref 0.0–0.2)

## 2019-12-28 LAB — GLUCOSE, CAPILLARY
Glucose-Capillary: 96 mg/dL (ref 70–99)
Glucose-Capillary: 143 mg/dL — ABNORMAL HIGH (ref 70–99)
Glucose-Capillary: 89 mg/dL (ref 70–99)
Glucose-Capillary: 208 mg/dL — ABNORMAL HIGH (ref 70–99)

## 2019-12-28 LAB — HEPARIN LEVEL (UNFRACTIONATED)
Heparin Unfractionated: 0.23 IU/mL — ABNORMAL LOW (ref 0.30–0.70)
Heparin Unfractionated: <0.1 IU/mL — ABNORMAL LOW (ref 0.30–0.70)

## 2019-12-28 LAB — PROTIME-INR
INR: 1 (ref 0.8–1.2)
Prothrombin Time: 13.3 seconds (ref 11.4–15.2)

## 2019-12-28 LAB — MAGNESIUM: Magnesium: 1.9 mg/dL (ref 1.7–2.4)

## 2019-12-28 MED ORDER — MIDAZOLAM HCL 2 MG/2ML IJ SOLN
INTRAMUSCULAR | Status: AC
  Filled 2019-12-28: qty 2

## 2019-12-28 MED ORDER — CEFAZOLIN SODIUM-DEXTROSE 2-4 GM/100ML-% IV SOLN
INTRAVENOUS | Status: AC
  Administered 2019-12-28: 2 g via INTRAVENOUS
  Filled 2019-12-28: qty 100

## 2019-12-28 MED ORDER — LIDOCAINE HCL 1 % IJ SOLN
INTRAMUSCULAR | Status: AC | PRN
  Administered 2019-12-28: 10 mL

## 2019-12-28 MED ORDER — FENTANYL CITRATE (PF) 100 MCG/2ML IJ SOLN
INTRAMUSCULAR | Status: AC | PRN
  Administered 2019-12-28: 50 ug via INTRAVENOUS

## 2019-12-28 MED ORDER — MIDAZOLAM HCL 2 MG/2ML IJ SOLN
INTRAMUSCULAR | Status: AC | PRN
  Administered 2019-12-28: 1 mg via INTRAVENOUS

## 2019-12-28 MED ORDER — SODIUM CHLORIDE 0.9% FLUSH: 10.0000 mL | INTRAVENOUS | Status: DC | PRN

## 2019-12-28 MED ORDER — LIDOCAINE HCL 1 % IJ SOLN
INTRAMUSCULAR | Status: AC
  Filled 2019-12-28: qty 20

## 2019-12-28 MED ORDER — GELATIN ABSORBABLE 12-7 MM EX MISC
CUTANEOUS | Status: AC | PRN
  Administered 2019-12-28: 1 via TOPICAL

## 2019-12-28 MED ORDER — CEFAZOLIN SODIUM-DEXTROSE 2-4 GM/100ML-% IV SOLN
2.0000 g | Freq: Once | INTRAVENOUS | Status: AC
  Filled 2019-12-28: qty 100

## 2019-12-28 MED ORDER — GELATIN ABSORBABLE 12-7 MM EX MISC
CUTANEOUS | Status: AC
  Filled 2019-12-28: qty 1

## 2019-12-28 MED ORDER — HEPARIN SODIUM (PORCINE) 1000 UNIT/ML IJ SOLN
INTRAMUSCULAR | Status: AC | PRN
  Administered 2019-12-28: 3200 [IU] via INTRAVENOUS

## 2019-12-28 MED ORDER — FENTANYL CITRATE (PF) 100 MCG/2ML IJ SOLN
INTRAMUSCULAR | Status: AC
  Filled 2019-12-28: qty 2

## 2019-12-28 MED ORDER — CHLORHEXIDINE GLUCONATE 4 % EX LIQD
CUTANEOUS | Status: AC
  Filled 2019-12-28: qty 15

## 2019-12-28 MED ORDER — HEPARIN (PORCINE) 25000 UT/250ML-% IV SOLN
900.0000 [IU]/h | INTRAVENOUS | Status: DC
  Administered 2019-12-28 (×2): 800 [IU]/h via INTRAVENOUS
  Administered 2019-12-31: 900 [IU]/h via INTRAVENOUS
  Filled 2019-12-28 (×3): qty 250

## 2019-12-28 MED ORDER — HEPARIN SODIUM (PORCINE) 1000 UNIT/ML IJ SOLN
INTRAMUSCULAR | Status: AC
  Filled 2019-12-28: qty 1

## 2019-12-28 MED ORDER — PREDNISONE 20 MG PO TABS
40.0000 mg | ORAL_TABLET | Freq: Every day | ORAL | Status: AC
  Administered 2019-12-28 – 2020-01-01 (×5): 40 mg via ORAL
  Filled 2019-12-28 (×4): qty 2

## 2019-12-28 NOTE — Procedures (Signed)
Interventional Radiology Procedure Note  Procedure: Placement of a right IJ approach tunneled HD catheter 19cm tip to cuff.  Tip is positioned at the superior cavoatrial junction and catheter is ready for immediate use.  Complications: None Recommendations:  - Ok to use - Do not submerge - Routine line care   Signed,  Veronika Heard S. Azad Calame, DO    

## 2019-12-28 NOTE — Consult Note (Signed)
Chief Complaint: Patient was seen in consultation today for tunneled dialysis catheter Chief Complaint  Patient presents with  . Abdominal Pain   at the request of Dr Corliss Marcus  Supervising Physician: Corrie Mckusick  Patient Status: [facility] - In-pt  History of Present Illness: Kyle [name] is a 69 y.o. male   HTN; PAD; Cardiomyopathy; COPD Presents to ED with abd pain; N/V/D x 1 week Cr 14 on admission (1.47 11/30/19) AKI secondary volume depletion Hyperkalemia-- resolving  Temp cath placed by [facility] 12/24/19 Not working well per Dr Augustin Coupe and dialysis techs Oliguric Seems progressive renal failure-- prolonged recovery  Requesting conversion of temp to tunneled catheter  Or New placement per Nephrology  I have seen and consented pt  Past Medical History:  Diagnosis Date  . Aortic insufficiency    MODERATE WITH A BICUSPID AORTIC VAVLE  . Arterial occlusive disease    MULTILEVEL  . CHF (congestive heart failure) ([place])   . COPD (chronic obstructive pulmonary disease) ([place])   . Dilated cardiomyopathy (HCC)    WITH EJECTION FRACTION DOWN TO 20-25%--WITH CONGESTIVE HEART FAILURE  . Edema    LOWER EXTREMETIES  . Hypertension   . Normal coronary arteries 2009  . Orthopnea   . Peripheral arterial disease ([place])   . Pulmonary hypertension ([place])   . SOB (shortness of breath)     Past Surgical History:  Procedure Laterality Date  . ABDOMINAL AORTAGRAM N/A 11/05/2014   Procedure: ABDOMINAL Maxcine Ham;  Surgeon: Angelia Mould, MD;  Location: [facility] CATH LAB;  Service: Cardiovascular;  Laterality: N/A;  . ABDOMINAL AORTOGRAM W/LOWER EXTREMITY Bilateral 04/21/2019   Procedure: ABDOMINAL AORTOGRAM W/LOWER EXTREMITY;  Surgeon: Angelia Mould, MD;  Location: [place] CV LAB;  Service: Cardiovascular;  Laterality: Bilateral;  . AORTA - BILATERAL FEMORAL ARTERY BYPASS GRAFT N/A 12/18/2014   Procedure: AORTOBIFEMORAL BYPASS GRAFT;  Surgeon: Angelia Mould, MD;  Location:  [place];  Service: Vascular;  Laterality: N/A;  . CARDIAC CATHETERIZATION  2009   Nl Cors, EF 20%  . COLONOSCOPY    . ENDARTERECTOMY FEMORAL Left 12/18/2014   Procedure: ENDARTERECTOMY FEMORAL;  Surgeon: Angelia Mould, MD;  Location: [place];  Service: Vascular;  Laterality: Left;  . FALSE ANEURYSM REPAIR Right 04/24/2019   Procedure: REPAIR OF RIGHT FEMORAL ARTERY PSEUDOANEURYSM;  Surgeon: Rosetta Posner, MD;  Location: [place];  Service: Vascular;  Laterality: Right;  . FEMORAL-POPLITEAL BYPASS GRAFT  02/21/2011   right  . FEMORAL-TIBIAL BYPASS GRAFT Right 04/24/2019   Procedure: REDO BYPASS GRAFT RIGHT FEMORAL-TIBIAL ARTERY USING LEFT LEG VEIN & HEMASHIELD GOLD 52mm GRAFT;  Surgeon: Rosetta Posner, MD;  Location: [place];  Service: Vascular;  Laterality: Right;  . OTHER SURGICAL HISTORY     POST RIGHT FEMOROPOPLITEAL BYPASS GRAFT  . PERIPHERAL VASCULAR CATHETERIZATION  11/05/2014   Procedure: LOWER EXTREMITY ANGIOGRAPHY;  Surgeon: Angelia Mould, MD;  Location: [facility] CATH LAB;  Service: Cardiovascular;;  . VEIN HARVEST Left 04/24/2019   Procedure: LEFT LEG SAPHENOUS VEIN HARVEST;  Surgeon: Rosetta Posner, MD;  Location: [place];  Service: Vascular;  Laterality: Left;    Allergies: Patient has no known allergies.  Medications: Prior to Admission medications   Medication Sig Start Date End Date Taking? Authorizing Provider  amLODipine (NORVASC) 5 MG tablet Take 1 tablet (5 mg total) by mouth daily. 01/05/19  Yes Martinique, Peter M, MD  ANORO ELLIPTA 62.5-25 MCG/INH AEPB INHALE 1 PUFF BY MOUTH ONCE DAILY . APPOINTMENT REQUIRED  FOR FUTURE REFILLS Patient taking differently: Inhale 1 puff into the lungs daily.  07/31/19  Yes Rigoberto Noel, MD  apixaban (ELIQUIS) 5 MG TABS tablet Take 1 tablet (5 mg total) by mouth 2 (two) times daily. 12/19/19  Yes Martinique, Peter M, MD  carvedilol (COREG) 12.5 MG tablet TAKE 1 TABLET TWICE DAILY WITH MEALS Patient taking differently: Take 12.5 mg by mouth 2 (two)  times a day.  01/05/19  Yes Martinique, Peter M, MD  furosemide (LASIX) 40 MG tablet TAKE 1 TABLET (40 MG TOTAL) BY MOUTH DAILY. 03/27/19  Yes Martinique, Peter M, MD  guaiFENesin (MUCINEX) 600 MG 12 hr tablet Take 2 tablets (1,200 mg total) by mouth 2 (two) times daily. Patient taking differently: Take 600 mg by mouth 2 (two) times daily.  11/30/18  Yes Lavina Hamman, MD  insulin glargine (LANTUS) 100 UNIT/ML injection Inject 15 Units into the skin at bedtime.   Yes [provider]  metFORMIN (GLUCOPHAGE) 500 MG tablet Take 500 mg by mouth 2 (two) times a day.    Yes [provider]  OXYGEN Inhale 4.5 L into the lungs continuous.   Yes [provider]  quinapril (ACCUPRIL) 10 MG tablet TAKE 1 TABLET (10 MG TOTAL) DAILY. Patient taking differently: Take 10 mg by mouth daily.  03/27/19  Yes Martinique, Peter M, MD  ACCU-CHEK FASTCLIX LANCETS MISC  07/30/18   [provider]  Accu-Chek FastClix Lancets MISC USE TO MONITOR BLOOD GLUCOSE 2 TIMES DAILY 11/06/19   [provider]  ACCU-CHEK GUIDE test strip  11/30/18   [provider]  BD PEN NEEDLE NANO U/F 32G X 4 MM MISC  08/06/18   [provider]  glucose blood (ACCU-CHEK GUIDE) test strip one strip (1 each dose) by Other route 2 (two) times daily. Test blood glucose 2 times a day. 11/21/19   [provider]  Insulin Pen Needle (BD PEN NEEDLE NANO U/F) 32G X 4 MM MISC USE TO INJECT INTO THE SKIN DAILY. 11/06/19   [provider]  pneumococcal 13-valent conjugate vaccine (PREVNAR 13) SUSP injection Inject into the muscle. 12/21/17   [provider]  rosuvastatin (CRESTOR) 40 MG tablet Take 1 tablet (40 mg total) by mouth daily. 12/01/19 02/29/20  Martinique, Peter M, MD  umeclidinium-vilanterol (ANORO ELLIPTA) 62.5-25 MCG/INH AEPB Inhale 1 puff into the lungs daily. 08/07/19   Lauraine Rinne, NP     Family History  Problem Relation Age of Onset  . Diabetes Mother   . Hyperlipidemia Sister    . Hypertension Sister     Social History   Socioeconomic History  . Marital status: Married    Spouse name: Not on file  . Number of children: 1  . Years of education: Not on file  . Highest education level: Not on file  Occupational History  . Occupation: retired    Fish farm manager: [employer]  Tobacco Use  . Smoking status: Former Smoker    Packs/day: 1.00    Years: 40.00    Pack years: 40.00    Quit date: 10/15/2018    Years since quitting: 1.2  . Smokeless tobacco: Never Used  . Tobacco comment: quit 2010 and quit again 2019  Substance and Sexual Activity  . Alcohol use: No    Alcohol/week: 0.0 standard drinks  . Drug use: No  . Sexual activity: Not on file  Other Topics Concern  . Not on file  Social History Narrative  . Not on  file   Social Determinants of Health   Financial Resource Strain:   . Difficulty of Paying Living Expenses: Not on file  Food Insecurity:   . Worried About Charity fundraiser in the Last Year: Not on file  . Ran Out of Food in the Last Year: Not on file  Transportation Needs:   . Lack of Transportation (Medical): Not on file  . Lack of Transportation (Non-Medical): Not on file  Physical Activity:   . Days of Exercise per Week: Not on file  . Minutes of Exercise per Session: Not on file  Stress:   . Feeling of Stress : Not on file  Social Connections:   . Frequency of Communication with Friends and Family: Not on file  . Frequency of Social Gatherings with Friends and Family: Not on file  . Attends Religious Services: Not on file  . Active Member of Clubs or Organizations: Not on file  . Attends Archivist Meetings: Not on file  . Marital Status: Not on file    Review of Systems: A 12 point ROS discussed and pertinent positives are indicated in the HPI above.  All other systems are negative.  Review of Systems  Constitutional: Negative for appetite change, fatigue and fever.  Respiratory: Negative for cough and  shortness of breath.   Cardiovascular: Negative for chest pain.  Gastrointestinal: Positive for abdominal pain, diarrhea, nausea and vomiting.  Neurological: Positive for weakness.  Psychiatric/Behavioral: Negative for behavioral problems and confusion.    Vital Signs: BP 136/64 (BP Location: Right Arm)   Pulse (!) 58   Temp 98.5 F (36.9 C)   Resp 16   Ht 5\' 11"  (1.803 m)   Wt 155 lb 10.3 oz (70.6 kg)   SpO2 93%   BMI 21.71 kg/m   Physical Exam Vitals reviewed.  Cardiovascular:     Rate and Rhythm: Normal rate and regular rhythm.  Pulmonary:     Effort: Pulmonary effort is normal.     Breath sounds: Normal breath sounds.  Abdominal:     General: Bowel sounds are normal.     Palpations: Abdomen is soft.     Tenderness: There is no abdominal tenderness.  Skin:    General: Skin is warm and dry.     Comments: Rt IJ non tunneled catheter in place  Neurological:     Mental Status: He is alert and oriented to person, place, and time.  Psychiatric:        Behavior: Behavior normal.     Imaging: EEG  Result Date: 12/25/2019 Lora Havens, MD     12/25/2019  1:52 PM Patient Name: Kyle [name] MRN: [id number] Epilepsy Attending: Lora Havens Referring Physician/Provider: Dr Karena Addison Aroor Date: 12/25/2019 Duration: 24.44 mins  Patient history: 69 year old male who presented to the hospital today with acute renal failure and AMS. Had sudden onset of worsened AMS with decreased movement. MRI Brain showed no acute finding. 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  Level of alertness: comatose  AEDs during EEG study: None  Technical aspects: This EEG study was done with scalp electrodes positioned according to the 10-20 International system of electrode placement. Electrical activity was acquired at a sampling rate of 500Hz  and reviewed with a high frequency filter of 70Hz  and a low frequency filter of 1Hz . EEG data were recorded  continuously and digitally stored.  DESCRIPTION:  EEG showed continuous generalized and lateralized right hemisphere 2-3hz   rhythmic low amplitude delta slowing. Intermittent sharply contoured, generalized, maximal bifrontal 4-5hz  theta slowing was also noted. EEG was reactive to noxious stimulation.  Hyperventilation and photic stimulation were not performed.  ABNORMALITY - Continuous rhythmic slow, generalized  IMPRESSION: This study is suggestive of profound diffuse encephalopathy, non specific to etiology.  No seizures or epileptiform discharges were seen throughout the recording. EEG appeared similar to last night. Lora Havens   EEG  Result Date: 12/24/2019 Lora Havens, MD     12/24/2019 11:57 PM Patient Name: Kyle [name] MRN: [id number] Epilepsy Attending: Lora Havens Referring Physician/Provider: Dr Kerney Elbe Date: 12/24/2019 Duration: 25.25 mins Patient history: 69 year old male who presented to the hospital today with acute renal failure and AMS. Had sudden onset of worsened AMS with decreased movement. MRI Brain showed no acute finding. 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 Level of alertness: comatose AEDs during EEG study: None Technical aspects: This EEG study was done with scalp electrodes positioned according to the 10-20 International system of electrode placement. Electrical activity was acquired at a sampling rate of 500Hz  and reviewed with a high frequency filter of 70Hz  and a low frequency filter of 1Hz . EEG data were recorded continuously and digitally stored. DESCRIPTION:  EEG showed continuous generalized 2-3hz  rhythmic low amplitude delta slowing. Intermittent sharply contoured, generalized, maximal bifrontal 4-5hz  theta slowing was also noted. EEG was reactive to noxious stimulation.  Hyperventilation and photic stimulation were not performed. ABNORMALITY - Continuous rhythmic slow, generalized IMPRESSION: This study is  suggestive of profound diffuse encephalopathy, non specific to etiology.  No seizures or epileptiform discharges were seen throughout the recording. Dr Cheral Marker was notifed Lora Havens   MR ANGIO HEAD WO CONTRAST  Result Date: 12/24/2019 CLINICAL DATA:  Altered mental status. Bilateral lower extremity weakness. EXAM: MRI HEAD WITHOUT CONTRAST MRA HEAD WITHOUT CONTRAST TECHNIQUE: Multiplanar, multiecho pulse sequences of the brain and surrounding structures were obtained without intravenous contrast. Angiographic images of the head were obtained using MRA technique without contrast. COMPARISON:  Head CT same day. FINDINGS: MRI HEAD FINDINGS Brain: Diffusion imaging does not show any acute or subacute infarction. No abnormality is seen affecting the brainstem or cerebellum. Cerebral hemispheres show symmetric old white matter infarctions in the posterior frontal white matter and an old tiny left posterior frontal cortical infarction. No large vessel territory infarction. No mass lesion, hemorrhage, hydrocephalus or extra-axial collection. Vascular: Major vessels at the base of the brain show flow. Skull and upper cervical spine: Negative Sinuses/Orbits: Clear/normal Other: None MRA HEAD FINDINGS Both internal carotid arteries are patent through the skull base and siphon regions. The anterior and middle cerebral vessels show flow. There is considerable motion degradation. That could potentially be multiple stenoses in the anterior and middle cerebral arteries. Both vertebral arteries are patent to the basilar. Posterior circulation branch vessels show flow. There could possibly be stenoses within the PCA branches. IMPRESSION: MRI head: No acute finding. 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 MRA head: Study is markedly degraded by motion. No large vessel occlusion. Potential for multiple stenoses in the  anterior, middle and posterior cerebral artery territories. Detail markedly limited by motion. Electronically Signed   By: Nelson Chimes M.D.   On: 12/24/2019 22:31   MR BRAIN WO CONTRAST  Result Date: 12/24/2019 CLINICAL DATA:  Altered mental status. Bilateral lower extremity weakness. EXAM: MRI HEAD  WITHOUT CONTRAST MRA HEAD WITHOUT CONTRAST TECHNIQUE: Multiplanar, multiecho pulse sequences of the brain and surrounding structures were obtained without intravenous contrast. Angiographic images of the head were obtained using MRA technique without contrast. COMPARISON:  Head CT same day. FINDINGS: MRI HEAD FINDINGS Brain: Diffusion imaging does not show any acute or subacute infarction. No abnormality is seen affecting the brainstem or cerebellum. Cerebral hemispheres show symmetric old white matter infarctions in the posterior frontal white matter and an old tiny left posterior frontal cortical infarction. No large vessel territory infarction. No mass lesion, hemorrhage, hydrocephalus or extra-axial collection. Vascular: Major vessels at the base of the brain show flow. Skull and upper cervical spine: Negative Sinuses/Orbits: Clear/normal Other: None MRA HEAD FINDINGS Both internal carotid arteries are patent through the skull base and siphon regions. The anterior and middle cerebral vessels show flow. There is considerable motion degradation. That could potentially be multiple stenoses in the anterior and middle cerebral arteries. Both vertebral arteries are patent to the basilar. Posterior circulation branch vessels show flow. There could possibly be stenoses within the PCA branches. IMPRESSION: MRI head: No acute finding. 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 MRA head: Study is markedly degraded by motion. No large vessel occlusion. Potential for multiple stenoses in the anterior, middle and posterior  cerebral artery territories. Detail markedly limited by motion. Electronically Signed   By: Nelson Chimes M.D.   On: 12/24/2019 22:31   US RENAL  Result Date: 12/24/2019 CLINICAL DATA:  Chronic kidney disease. EXAM: RENAL / URINARY TRACT ULTRASOUND COMPLETE COMPARISON:  CT 12/24/2019 FINDINGS: Right Kidney: Renal measurements: 8.6 x 3.9 x 4.7 cm = volume: 82 mL. Increased renal echogenicity. Left Kidney: Renal measurements: 11.3 x 5.6 x 5.8 cm = volume: 196 mL. Increased renal echogenicity. Bladder: Appears normal for degree of bladder distention. Other: Trace left perinephric fluid identified. Mild limitations secondary to patient's shortness of breath. IMPRESSION: 1. Increased renal echogenicity, suggesting medical renal disease. Mildly small right kidney relative to the left. 2. Trace left perinephric fluid, likely related to renal insufficiency. Electronically Signed   By: Abigail Miyamoto M.D.   On: 12/24/2019 14:49   DG CHEST PORT 1 VIEW  Result Date: 12/25/2019 CLINICAL DATA:  Status post intubation EXAM: PORTABLE CHEST 1 VIEW COMPARISON:  12/24/2019 FINDINGS: Right jugular central line is again seen and stable. Endotracheal tube is noted 5 cm above the carina. Gastric catheter extends into the stomach. Cardiac shadow is stable. Mild aortic calcifications are seen. The lungs are hyperinflated consistent with COPD. No focal infiltrate is seen. IMPRESSION: Tubes and lines as described above. COPD without acute abnormality. Electronically Signed   By: Inez Catalina M.D.   On: 12/25/2019 01:35   DG Chest Port 1 View  Result Date: 12/24/2019 CLINICAL DATA:  Central line placement EXAM: PORTABLE CHEST 1 VIEW COMPARISON:  12/24/2019 at 2:09 p.m. FINDINGS: 6:10 p.m. Right internal jugular line tip at high SVC, without pneumothorax. Midline trachea. Borderline cardiomegaly. No pleural effusion or pneumothorax. Hyperinflation and interstitial thickening. Mild scarring or subsegmental atelectasis at the left lung  base. IMPRESSION: Hyperinflation and chronic interstitial thickening, consistent with COPD/chronic bronchitis. New right internal jugular line, without pneumothorax. Electronically Signed   By: Abigail Miyamoto M.D.   On: 12/24/2019 18:38   DG CHEST PORT 1 VIEW  Result Date: 12/24/2019 CLINICAL DATA:  Acute kidney injury and shortness of breath. EXAM: PORTABLE CHEST 1 VIEW COMPARISON:  11/22/2018 and prior radiographs FINDINGS: Cardiomegaly, pulmonary vascular congestion and mild interstitial opacities noted. Mild bibasilar atelectasis is present. No definite pleural effusion or pneumothorax. No acute bony abnormality noted. IMPRESSION: Cardiomegaly with pulmonary vascular congestion and question mild interstitial edema. Electronically Signed   By: Margarette Canada M.D.   On: 12/24/2019 14:48   Overnight EEG with video  Result Date: 12/26/2019 Lora Havens, MD     12/26/2019  3:29 PM Patient Name:Kyle [name] [id number] Epilepsy Attending:Priyanka [name] Referring Physician/Provider:Dr Karena Addison Aroor Duration: 12/25/2019 1127 to 12/26/2019 1054  Patient history:69 year old male who presented to the hospital today with acute renal failure and AMS. Had sudden onset of worsened AMS with decreased movement. 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  Level of alertness:awake/lethargic, asleep  AEDs during EEG study: LEV  Technical aspects: This EEG study was done with scalp electrodes positioned according to the 10-20 International system of electrode placement. Electrical activity was acquired at a sampling rate of 500Hz  and reviewed with a high frequency filter of 70Hz  and a low frequency filter of 1Hz . EEG data were recorded continuously and digitally stored.  DESCRIPTION: During awake state, no clear posterior dominant rhythm was seen.  Sleep was characterized by vertex waves, maximal frontocentral.  EEG showed continuous  generalized 3-5Hz  theta-delta slowing.Hyperventilation and photic stimulation were not performed.  ABNORMALITY - Continuous rhythmic slow, generalized  IMPRESSION: This study issuggestive of moderate to severe diffuse encephalopathy, non specific to etiology.No seizures or epileptiform discharges were seen throughout the recording.  EEG appears to be improving compared to previous day. Lora Havens   ECHOCARDIOGRAM COMPLETE  Result Date: 12/13/2019    ECHOCARDIOGRAM REPORT   Patient Name:   Kyle [name] Date of Exam: 12/13/2019 Medical Rec #:  [id number]      Height:       71.0 in Accession #:    [id number]     Weight:       158.0 lb Date of Birth:  April 27, 1951      BSA:          1.91 m Patient Age:    39 years       BP:           144/74 mmHg Patient Gender: M              HR:           69 bpm. Exam Location:  [place] Procedure: 2D Echo, Cardiac Doppler and Color Doppler Indications:    I50.21 CHF  History:        Patient has prior history of Echocardiogram examinations, most                 recent 11/21/2018. Cardiomyopathy, COPD, PAD and Pulmonary HTN,                 Signs/Symptoms:Shortness of Breath; Risk Factors:Hypertension                 and CKD.  Sonographer:    Marygrace Drought RCS Referring Phys: [id number] PETER M Martinique IMPRESSIONS  1. Severe concentric LVH, consider evaluation for infiltrative cardiomyopathies such as cardiac amyloidosis.  2. Impaired GLS: -14.6%. Left ventricular ejection fraction, by estimation, is 65 to 70%. The left ventricle has normal function. The left ventricle has no regional wall motion abnormalities. There is severe concentric left ventricular hypertrophy. Left  ventricular diastolic parameters are consistent with  Grade I diastolic dysfunction (impaired relaxation).  3. Right ventricular systolic function is normal. The right ventricular size is normal. There is moderately elevated pulmonary artery systolic pressure. The estimated right ventricular systolic  pressure is [id number] mmHg.  4. The mitral valve is normal in structure and function. No evidence of mitral valve regurgitation. No evidence of mitral stenosis.  5. The aortic valve is normal in structure and function. Aortic valve regurgitation is moderate. Mild to moderate aortic valve stenosis.  6. The inferior vena cava is normal in size with greater than 50% respiratory variability, suggesting right atrial pressure of 3 mmHg. FINDINGS  Left Ventricle: Impaired GLS: -14.6%. Left ventricular ejection fraction, by estimation, is 65 to 70%. The left ventricle has normal function. The left ventricle has no regional wall motion abnormalities. The left ventricular internal cavity size was normal in size. There is severe concentric left ventricular hypertrophy. Left ventricular diastolic parameters are consistent with Grade I diastolic dysfunction (impaired relaxation). Normal left ventricular filling pressure. Right Ventricle: The right ventricular size is normal. No increase in right ventricular wall thickness. Right ventricular systolic function is normal. There is moderately elevated pulmonary artery systolic pressure. The tricuspid regurgitant velocity is 3.88 m/s, and with an assumed right atrial pressure of 3 mmHg, the estimated right ventricular systolic pressure is [id number] mmHg. Left Atrium: Left atrial size was normal in size. Right Atrium: Right atrial size was normal in size. Pericardium: There is no evidence of pericardial effusion. Mitral Valve: The mitral valve is normal in structure and function. Normal mobility of the mitral valve leaflets. No evidence of mitral valve regurgitation. No evidence of mitral valve stenosis. Tricuspid Valve: The tricuspid valve is normal in structure. Tricuspid valve regurgitation is mild . No evidence of tricuspid stenosis. Aortic Valve: The aortic valve is normal in structure and function.. There is severe thickening and severe calcifcation of the aortic valve. Aortic valve  regurgitation is moderate. Aortic regurgitation PHT measures 714 msec. Mild to moderate aortic stenosis is present. There is severe thickening of the aortic valve. There is severe calcifcation of the aortic valve. Aortic valve mean gradient measures 13.3 mmHg. Aortic valve peak gradient measures 22.9 mmHg. Aortic valve area, by VTI measures 1.92 cm. Pulmonic Valve: The pulmonic valve was normal in structure. Pulmonic valve regurgitation is not visualized. No evidence of pulmonic stenosis. Aorta: The aortic root is normal in size and structure. Venous: The inferior vena cava is normal in size with greater than 50% respiratory variability, suggesting right atrial pressure of 3 mmHg. IAS/Shunts: No atrial level shunt detected by color flow Doppler. Additional Comments: Severe concentric LVH, consider evaluation for infiltrative cardiomyopathies such as cardiac amyloidosis.  LEFT VENTRICLE PLAX 2D LVIDd:         3.84 cm  Diastology LVIDs:         2.56 cm  LV e' lateral:   7.29 cm/s LV PW:         1.69 cm  LV E/e' lateral: 5.3 LV IVS:        1.00 cm  LV e' medial:    5.22 cm/s LVOT diam:     2.20 cm  LV E/e' medial:  7.4 LV SV:         91.23 ml LV SV Index:   21.05 LVOT Area:     3.80 cm  RIGHT VENTRICLE RV Basal diam:  4.04 cm RV S prime:     11.60 cm/s TAPSE (M-mode): 2.2 cm RVSP:  63.2 mmHg LEFT ATRIUM             Index       RIGHT ATRIUM           Index LA diam:        3.10 cm 1.63 cm/m  RA Pressure: 3.00 mmHg LA Vol (A2C):   47.7 ml 25.01 ml/m RA Area:     17.10 cm LA Vol (A4C):   39.2 ml 20.55 ml/m RA Volume:   48.40 ml  25.37 ml/m LA Biplane Vol: 43.4 ml 22.75 ml/m  AORTIC VALVE AV Area (Vmax):    1.45 cm AV Area (Vmean):   1.43 cm AV Area (VTI):     1.92 cm AV Vmax:           239.33 cm/s AV Vmean:          173.000 cm/s AV VTI:            0.476 m AV Peak Grad:      22.9 mmHg AV Mean Grad:      13.3 mmHg LVOT Vmax:         91.60 cm/s LVOT Vmean:        65.000 cm/s LVOT VTI:          0.240 m  LVOT/AV VTI ratio: 0.50 AI PHT:            714 msec  AORTA Ao Root diam: 3.40 cm MITRAL VALVE               TRICUSPID VALVE MV Area (PHT):             TR Peak grad:   60.2 mmHg MV Decel Time:             TR Vmax:        388.00 cm/s MV E velocity: 38.80 cm/s  Estimated RAP:  3.00 mmHg MV A velocity: 78.60 cm/s  RVSP:           63.2 mmHg MV E/A ratio:  0.49                            SHUNTS                            Systemic VTI:  0.24 m                            Systemic Diam: 2.20 cm Ena Dawley MD Electronically signed by Ena Dawley MD Signature Date/Time: 12/13/2019/11:41:52 AM    Final    CT Renal Stone Study  Result Date: 12/24/2019 CLINICAL DATA:  Mid abdominal pain with vomiting and diarrhea. EXAM: CT ABDOMEN AND PELVIS WITHOUT CONTRAST TECHNIQUE: Multidetector CT imaging of the abdomen and pelvis was performed following the standard protocol without IV contrast. COMPARISON:  None. FINDINGS: Lower chest: Heart is enlarged. No substantial pericardial effusion. Minimal left lower lobe collapse/consolidation noted. 5 mm right lower lobe pulmonary nodule visible on 37/5. Probable atelectasis in the paraspinal right lower lobe. Hepatobiliary: No focal abnormality in the liver on this study without intravenous contrast. There is no evidence for gallstones, gallbladder wall thickening, or pericholecystic fluid. No intrahepatic or extrahepatic biliary dilation. Pancreas: No focal mass lesion. No dilatation of the main duct. No intraparenchymal cyst. No peripancreatic edema. Spleen: No splenomegaly. No focal mass lesion. Adrenals/Urinary Tract: No adrenal nodule  or mass. No stones are seen in either kidney or ureter. No bladder stones mild right renal atrophy noted. Stomach/Bowel: Stomach is distended with fluid. Duodenum is normally positioned as is the ligament of Treitz. No small bowel wall thickening. No small bowel dilatation. The terminal ileum is normal. The appendix is normal. No gross colonic  mass. No colonic wall thickening. Diverticular changes are noted in the left colon without evidence of diverticulitis. Vascular/Lymphatic: Status post aorto bi femoral bypass graft. There is no gastrohepatic or hepatoduodenal ligament lymphadenopathy. No retroperitoneal or mesenteric lymphadenopathy. No pelvic sidewall lymphadenopathy. Reproductive: The prostate gland and seminal vesicles are unremarkable. Other: No intraperitoneal free fluid. Musculoskeletal: No worrisome lytic or sclerotic osseous abnormality. IMPRESSION: 1. No acute findings in the abdomen or pelvis. 2. Minimal left lower lobe collapse/consolidation. 3. 5 mm right lower lobe pulmonary nodule. No follow-up needed if patient is low-risk. Non-contrast chest CT can be considered in 12 months if patient is high-risk. This recommendation follows the consensus statement: Guidelines for Management of Incidental Pulmonary Nodules Detected on CT Images: From the Fleischner Society 2017; Radiology 2017; 284:228-243. 4.  Aortic Atherosclerois (ICD10-170.0) Electronically Signed   By: Misty Stanley M.D.   On: 12/24/2019 07:10   CT HEAD CODE STROKE WO CONTRAST`  Result Date: 12/24/2019 CLINICAL DATA:  Code stroke. Altered mental status. Bilateral lower extremity weakness. EXAM: CT HEAD WITHOUT CONTRAST TECHNIQUE: Contiguous axial images were obtained from the base of the skull through the vertex without intravenous contrast. COMPARISON:  11/20/2018 FINDINGS: Brain: No evidence of accelerated brain atrophy. No evidence of old or acute small or large vessel infarction. No mass lesion, hemorrhage, hydrocephalus or extra-axial collection. Vascular: There is atherosclerotic calcification of the major vessels at the base of the brain. Skull: Negative Sinuses/Orbits: Clear sinuses. Orbits negative. Old medial wall blowout fracture on the right. Other: None ASPECTS ([place] Section Three Stroke Program Early CT Score) - Ganglionic level infarction (caudate, lentiform nuclei,  internal capsule, insula, M1-M3 cortex): 7 - Supraganglionic infarction (M4-M6 cortex): 3 Total score (0-10 with 10 being normal): 10 IMPRESSION: 1. No acute finding. Normal appearing brain for age. Extensive atherosclerotic calcification of the major vessels at the base of the brain. 2. ASPECTS is 10 3. These results were communicated to Dr. Cheral Marker at 9:16 pmon 2/28/2021by text page via the [facility] messaging system. Electronically Signed   By: Nelson Chimes M.D.   On: 12/24/2019 21:18    Labs:  CBC: Recent Labs    12/25/19 1300 12/26/19 0628 12/27/19 0344 12/28/19 0337  WBC 12.5* 11.5* 10.9* 9.5  HGB 12.5* 12.3* 11.2* 10.3*  HCT 35.4* 35.6* 32.6* 30.3*  PLT 261 208 141* 119*    COAGS: Recent Labs    04/22/19 1819 12/27/19 1813  APTT 87* >200*    BMP: Recent Labs    12/25/19 0425 12/25/19 1052 12/25/19 2124 12/26/19 0629 12/26/19 1700 12/27/19 0344  NA 140  --   --  139 136 136  K 4.2  4.2   < > 4.1 3.5 3.0* 3.9  CL 95*  --   --  90* 93* 96*  CO2 18*  --   --  28 26 23   GLUCOSE 105*  --   --  117* 126* 155*  BUN 90*  --   --  120* 36* 84*  CALCIUM 7.8*  --   --  7.7* 8.4* 7.8*  CREATININE 8.87*  --   --  10.70* 4.24* 8.50*  GFRNONAA 6*  --   --  4* 14* 6*  GFRAA 6*  --   --  5* 16* 7*   < > = values in this interval not displayed.    LIVER FUNCTION TESTS: Recent Labs    12/24/19 0419 12/24/19 0419 12/25/19 0425 12/26/19 0628 12/26/19 0629 12/27/19 0344  BILITOT 2.5*  --  1.1 1.5*  --  1.5*  AST 36  --  37 31  --  25  ALT 28  --  27 30  --  26  ALKPHOS 65  --  53 55  --  51  PROT 7.7  --  6.0* 5.5*  --  5.4*  ALBUMIN 3.0*   < > 2.5*  2.5* 2.3* 2.3* 2.2*  2.2*   < > = values in this interval not displayed.    TUMOR MARKERS: No results for input(s): AFPTM, CEA, CA199, CHROMGRNA in the last 8760 hours.  Assessment and Plan:  AKI/CKD Progressive renal failure Oliguric Need for ongoing dialysis for now per Nephrology Scheduled now for conversion  of temp cath to tunneled or new placement in IR Risks and benefits discussed with the patient including, but not limited to bleeding, infection, vascular injury, pneumothorax which may require chest tube placement, air embolism or even death  All of the patient's questions were answered, patient is agreeable to proceed. Consent signed and in chart.   Thank you for this interesting consult.  I greatly enjoyed meeting Kyle [name] and look forward to participating in their care.  A copy of this report was sent to the requesting provider on this date.  Electronically Signed: Lavonia Drafts, PA-C 12/28/2019, 8:17 AM   I spent a total of 20 Minutes    in face to face in clinical consultation, greater than 50% of which was counseling/coordinating care for tunneled HD cath placement

## 2019-12-28 NOTE — Progress Notes (Signed)
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 Has COPD on 4L home O2. In ED labs showed creat 14, K 6.3, on lasix and ace inhibitor at home. Pt admitted to CCM. Asked to see for AKI; cr 1.47 on 2/4/21w/ BLcr from Jun 2020-feb 2021 1.4- 1.7.   Assessment/ Plan:   1. AoCKD 3 - in setting of vol depletion/ vomiting/ diarrhea and ACEi. B/l creat is 1.5.Txfor hyperkalemia and po K+ lowering meds; K better now.   - Dialyzed past 2 days in a row; oliguric. - Temp cath is not working well and he may be a prolonged recovery -> will request conversion to tunneled catheter by VIR. Major issues with the catheter yest.  - iHD on 3/2 with net UF 2L and then for under 2 hrs on 3/3  - no e/o recovery -> plan on next HD Sat    2. DM on insulin 3. Vol depletion - getting IVF's w/ bicarb, sp 1L NS. BP'shigher 4. Lactic / metabolic acidosis - severebut now resolved. +diarrhea. 5. COPD - on homeO2, currently intubated 3/1  Subjective:   More awake now and interactive. No events overnight    Objective:   BP 136/64 (BP Location: Right Arm)   Pulse (!) 58   Temp 98.5 F (36.9 C)   Resp 17   Ht 5\' 11"  (1.803 m)   Wt 70.6 kg   SpO2 93%   BMI 21.71 kg/m   Intake/Output Summary (Last 24 hours) at 12/28/2019 [id number] Last data filed at 12/28/2019 [id number] Gross per 24 hour  Intake 1187.88 ml  Output 1150 ml  Net 37.88 ml   Weight change: 1.8 kg  Physical Exam: Genin bed converses now, very pleasant No rash, cyanosis or gangrene No jvd or bruits Chest clear bilatto bases, no rales, +rhonchi RRR no MRG Abd soft ntnd no massor ascites ExttrLEedema, no wounds or ulcers Access: RIJtemp GU: foley to gravity  Imaging: Overnight EEG with video  Result Date: 12/26/2019 Lora Havens, MD      12/26/2019  3:29 PM Patient Name:Pattrick Suzan Slick JS:[id number] Epilepsy Attending:Priyanka [name] Referring Physician/Provider:Dr Karena Addison Aroor Duration: 12/25/2019 1127 to 12/26/2019 1054  Patient history:69 year old male who presented to the hospital today with acute renal failure and AMS. Had sudden onset of worsened AMS with decreased movement. 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  Level of alertness:awake/lethargic, asleep  AEDs during EEG study: LEV  Technical aspects: This EEG study was done with scalp electrodes positioned according to the 10-20 International system of electrode placement. Electrical activity was acquired at a sampling rate of 500Hz  and reviewed with a high frequency filter of 70Hz  and a low frequency filter of 1Hz . EEG data were recorded continuously and digitally stored.  DESCRIPTION: During awake state, no clear posterior dominant rhythm was seen.  Sleep was characterized by vertex waves, maximal frontocentral.  EEG showed continuous generalized 3-5Hz  theta-delta slowing.Hyperventilation and photic stimulation were not performed.  ABNORMALITY - Continuous rhythmic slow, generalized  IMPRESSION: This study issuggestive of moderate to severe diffuse encephalopathy, non specific to etiology.No seizures or epileptiform discharges were seen throughout the recording.  EEG appears to be improving compared to previous day. [place]: [employer]  Lab 12/24/19 1005 12/24/19 1005 12/24/19 1445 12/24/19 1445 12/24/19 2035 12/24/19 2035 12/24/19 2051 12/24/19 2051 12/25/19 0326 12/25/19 0326 12/25/19 0425 12/25/19 0425 12/25/19 1052 12/25/19 1157 12/25/19 1855 12/25/19 2124 12/26/19 0629 12/26/19 1700 12/27/19 0344  NA 141   < > 142   < > 141  --  138  --  136  --  140  --   --   --   --   --  139 136 136  K 6.3*   < > 6.4*   < > 6.7*   < > 6.4*   < >  4.0   < > 4.2  4.2   < > 3.8 3.8 3.7 4.1 3.5 3.0* 3.9  CL 98  --  101  --  95*  --   --   --   --   --  95*  --   --   --   --   --  90* 93* 96*  CO2 8*  --  10*  --  13*  --   --   --   --   --  18*  --   --   --   --   --  28 26 23   GLUCOSE 254*  --  112*  --  93  --   --   --   --   --  105*  --   --   --   --   --  117* 126* 155*  BUN 147*  --  149*  --  149*  --   --   --   --   --  90*  --   --   --   --   --  120* 36* 84*  CREATININE 14.45*  --  14.23*  --  13.31*  --   --   --   --   --  8.87*  --   --   --   --   --  10.70* 4.24* 8.50*  CALCIUM 8.4*  --  8.4*  --  7.8*  --   --   --   --   --  7.8*  --   --   --   --   --  7.7* 8.4* 7.8*  PHOS  --   --   --   --   --   --   --   --   --   --  5.5*  5.4*  --   --   --   --   --  6.1*  --  6.8*   < > = values in this interval not displayed.   CBC Recent Labs  Lab 12/25/19 1300 12/26/19 0628 12/27/19 0344 12/28/19 0337  WBC 12.5* 11.5* 10.9* 9.5  HGB 12.5* 12.3* 11.2* 10.3*  HCT 35.4* 35.6* 32.6* 30.3*  MCV 82.1 85.4 84.2 86.6  PLT 261 208 141* 119*    Medications:    . amLODipine  5 mg Oral Daily  . carvedilol  6.25 mg Oral BID WC  . Chlorhexidine Gluconate Cloth  6 each Topical Daily  . feeding supplement (ENSURE ENLIVE)  237 mL Oral TID BM  . guaiFENesin  600 mg Oral BID  . insulin aspart  0-5 Units Subcutaneous QHS  . insulin aspart  0-9 Units Subcutaneous TID WC  . insulin glargine  10 Units Subcutaneous Daily  . levETIRAcetam  500 mg Oral BID  . pantoprazole  40 mg  Oral Daily  . umeclidinium-vilanterol  1 puff Inhalation Daily      Otelia Santee, MD 12/28/2019, 7:09 AM

## 2019-12-28 NOTE — Progress Notes (Signed)
ANTICOAGULATION CONSULT NOTE - Follow Up Consult  Pharmacy Consult for Heparin Indication: atrial fibrillation  No Known Allergies  Patient Measurements: Height: 5\' 11"  (180.3 cm) Weight: 155 lb 10.3 oz (70.6 kg) IBW/kg (Calculated) : 75.3  Heparin Dosing Weight: 70.6 kg  Vital Signs: Temp: 97.7 F (36.5 C) (03/04 0902) Temp Source: Oral (03/04 0902) BP: 140/61 (03/04 1416) Pulse Rate: 60 (03/04 1416)  Labs: Recent Labs    12/26/19 0628 12/26/19 [id number] 12/26/19 0629 12/26/19 1700 12/27/19 0344 12/27/19 1628 12/27/19 1813 12/28/19 0337 12/28/19 1113 12/28/19 1358  HGB 12.3*   < >  --   --  11.2*  --   --  10.3*  --   --   HCT 35.6*  --   --   --  32.6*  --   --  30.3*  --   --   PLT 208  --   --   --  141*  --   --  119*  --   --   APTT  --   --   --   --   --   --  >200*  --   --   --   LABPROT  --   --   --   --   --   --   --   --  13.3  --   INR  --   --   --   --   --   --   --   --  1.0  --   HEPARINUNFRC  --   --   --   --   --  1.10*  --  0.23*  --  <0.10*  CREATININE  --   --    < > 4.24* 8.50*  --   --   --  6.85*  --    < > = values in this interval not displayed.    Estimated Creatinine Clearance: 10.4 mL/min (A) (by C-G formula based on SCr of 6.85 mg/dL (H)).  Assessment: 68 yr old male presented to N/V/D X 1 week, found with acute renal failure. Pt has hx of a fib, for which he was taking apixaban PTA (last dose on 2/27, due to concern for coffee ground emesis, which has resolved; pt was on SQ heparin). Pharmacy was consulted to dose heparin for a fib.  Heparin level undetectable (<0.1) this afternoon. Patient's heparin drip turned off at 0900-1000 per RN for IR procedure. Will f/u with IR for restart post procedure  Goal of Therapy:  Heparin level 0.3 - 0.5 units/ml Monitor platelets by anticoagulation protocol: Yes   Plan:  F/U restart of heparin post procedure   Surena Welge A. Levada Dy, PharmD, BCPS, FNKF Clinical Pharmacist [place] Please  utilize Amion for appropriate phone number to reach the unit pharmacist ([place])

## 2019-12-28 NOTE — Progress Notes (Signed)
ANTICOAGULATION CONSULT NOTE - Follow Up Consult  Pharmacy Consult for Heparin Indication: atrial fibrillation  No Known Allergies  Patient Measurements: Height: 5\' 11"  (180.3 cm) Weight: 155 lb 10.3 oz (70.6 kg) IBW/kg (Calculated) : 75.3  Heparin Dosing Weight: 70.6 kg  Vital Signs: Temp: 98.2 F (36.8 C) (03/03 2041) Temp Source: Oral (03/03 1703) BP: 126/47 (03/03 2041) Pulse Rate: 58 (03/03 2041)  Labs: Recent Labs    12/25/19 1157 12/25/19 1300 12/25/19 1300 12/26/19 0628 12/26/19 0629 12/26/19 1700 12/27/19 0344 12/27/19 1628 12/27/19 1813 12/28/19 0337  HGB  --  12.5*   < > 12.3*  --   --  11.2*  --   --   --   HCT  --  35.4*  --  35.6*  --   --  32.6*  --   --   --   PLT  --  261  --  208  --   --  141*  --   --   --   APTT  --   --   --   --   --   --   --   --  >200*  --   HEPARINUNFRC  --   --   --   --   --   --   --  1.10*  --  0.23*  CREATININE  --   --   --   --  10.70* 4.24* 8.50*  --   --   --   CKTOTAL 519*  --   --   --   --   --   --   --   --   --    < > = values in this interval not displayed.    Estimated Creatinine Clearance: 8.4 mL/min (A) (by C-G formula based on SCr of 8.5 mg/dL (H)).  Assessment: 69 yr old male presented to N/V/D X 1 week, found with acute renal failure. Pt has hx of a fib, for which he was taking apixaban PTA (last dose on 2/27, due to concern for coffee ground emesis, which has resolved; pt was on SQ heparin). Pharmacy was consulted to dose heparin for a fib.  Heparin level subtherapeutic (0.23) on gtt at 700 units/hr. No issues with line per RN. Still small amount of bleeding from IJ site but controlled.  Goal of Therapy:  Heparin level 0.3 - 0.5 units/ml Monitor platelets by anticoagulation protocol: Yes   Plan:  Increase heparin gtt to 800 units/hr F/u 8 hr heparin level  Sherlon Handing, PharmD, BCPS Please see amion for complete clinical pharmacist phone list 12/28/2019 4:54 AM

## 2019-12-28 NOTE — Progress Notes (Addendum)
NEUROLOGY PROGRESS NOTE  Subjective: Patient is awake, alert, jovial.  Has no complaints  Exam: Vitals:   12/28/19 0510 12/28/19 0752  BP: 136/64   Pulse: (!) 58 (!) 58  Resp: 17 16  Temp: 98.5 F (36.9 C)   SpO2: 93%     ROS General ROS: negative for - chills, fatigue, fever, night sweats, weight gain or weight loss Psychological ROS: negative for - behavioral disorder, hallucinations, memory difficulties, mood swings or suicidal ideation Ophthalmic ROS: negative for - blurry vision, double vision, eye pain or loss of vision ENT ROS: negative for - epistaxis, nasal discharge, oral lesions, sore throat, tinnitus or vertigo Respiratory ROS: negative for - cough, hemoptysis, shortness of breath or wheezing Cardiovascular ROS: negative for - chest pain, dyspnea on exertion, edema or irregular heartbeat Gastrointestinal ROS: negative for - abdominal pain, diarrhea, hematemesis, nausea/vomiting or stool incontinence Genito-Urinary ROS: negative for - dysuria, hematuria, incontinence or urinary frequency/urgency Musculoskeletal ROS: negative for - joint swelling or muscular weakness Neurological ROS: as noted in HPI Dermatological ROS: negative for rash and skin lesion changes    Physical Exam  Constitutional: Appears well-developed and well-nourished.  Psych: Affect appropriate to situation Eyes: No scleral injection HENT: No OP obstrucion Head: Normocephalic.  Cardiovascular: Palpable throughout.  Respiratory: Effort normal, non-labored breathing GI: Soft.  No distension. There is no tenderness.  Skin: WDI Neuro:  Mental Status: Alert, oriented, thought content appropriate.  Speech fluent without evidence of aphasia.  Able to follow 3 step commands without difficulty. Cranial Nerves: II:  Visual fields grossly normal,  III,IV, VI: ptosis not present, extra-ocular motions intact bilaterally pupils equal, round, reactive to light and accommodation V,VII: smile symmetric,  facial light touch sensation normal bilaterally VIII: hearing normal bilaterally Motor: Right : Upper extremity   5/5    Left:     Upper extremity   5/5  Lower extremity   4/5     Lower extremity   4/5 Tone and bulk:normal tone throughout; no atrophy noted Sensory:  light touch intact throughout, bilaterally  Medications:  Scheduled: . amLODipine  5 mg Oral Daily  . carvedilol  6.25 mg Oral BID WC  . Chlorhexidine Gluconate Cloth  6 each Topical Daily  . feeding supplement (ENSURE ENLIVE)  237 mL Oral TID BM  . guaiFENesin  600 mg Oral BID  . insulin aspart  0-5 Units Subcutaneous QHS  . insulin aspart  0-9 Units Subcutaneous TID WC  . insulin glargine  10 Units Subcutaneous Daily  . levETIRAcetam  500 mg Oral BID  . pantoprazole  40 mg Oral Daily  . umeclidinium-vilanterol  1 puff Inhalation Daily   Continuous: . sodium chloride 250 mL (12/27/19 1954)  . heparin 800 Units/hr (12/28/19 0501)    Pertinent Labs/Diagnostics: No recent neurological diagnostics Renal panel pending-followed by nephrology  Kyle Quill PA-C [facility] [phone]  Assessment:  69 year old male with history of COPD, peripheral vascular disease, CHF with EF of 20 to 25%, pulmonary hypertension who was initially admitted with acute renal failure with severe hyperkalemia, metabolic acidosis and lactic acidosis.  On 3 2 between 730 and 8:30 PM he was noted to become nonverbal, flaccid in bilateral upper extremities.  Code stroke was called.  CT head, MRI and MRA of the head did not show any large vessel occlusion.  Spot EEG did not show any seizures.  LTM was obtained again did not show any epileptiform episodes.  Today patient has improved significantly, patient is alert and oriented  and very interactive.  Exam is nonfocal.   Impression: -Acute metabolic encephalopathy -Uremic encephalopathy-nephrology following --Possible seizure/sudden onset altered awareness  -At this point will  continue Keppra 500 mg twice daily for now.  -- No driving for 6 months -- Seizure precuations  Per [facility] statutes, patients with seizures are not allowed to drive until they have been seizure-free for six months. Use caution when using heavy equipment or power tools. Avoid working on ladders or at heights. Take showers instead of baths. Ensure the water temperature is not too high on the home water heater. Do not go swimming alone. Do not lock yourself in a room alone (i.e. bathroom). When caring for infants or small children, sit down when holding, feeding, or changing them to minimize risk of injury to the child in the event you have a seizure. Maintain good sleep hygiene. Avoid alcohol.    If Kyle [name] has another seizure, call 911 and bring them back to the ED if:       A.  The seizure lasts longer than 5 minutes.            B.  The patient doesn't wake shortly after the seizure or has new problems such as difficulty seeing, speaking or moving following the seizure       C.  The patient was injured during the seizure       D.  The patient has a temperature over 102 F (39C)       E.  The patient vomited during the seizure and now is having trouble breathing   12/28/2019, 8:47 AM  NEUROHOSPITALIST ADDENDUM Performed a face to face diagnostic evaluation.   I have reviewed the contents of history and physical exam as documented by PA/ARNP/Resident and agree with above documentation.  I have discussed and formulated the above plan as documented. Edits to the note have been made as needed.  Patient close to his baseline, BUN improved to 66. Has no prior h/o of seizures. Acute loss of consciousness likely 2/2 seizure: possibly in the setting of significant uremia/ and possibly old left cortical infarction.   Outpatient neurology follow up. If he remains seizure free, may consider D/C keppra as seizure likely provoked.     Karena Addison Keegen Heffern MD [facility] [id number]   If 7pm to 7am, please call on call as listed on AMION.

## 2019-12-28 NOTE — Progress Notes (Signed)
Kyle NOTE    [name]  NOT:[id number] DOB: [birth year] DOA: 12/24/2019 PCP: Alonna Buckler, Kyle    Brief Narrative:  69 year old man with a history of heavy smoking, COPD, peripheral vascular disease presenting with nausea, intermittent vomiting, abd cramping/pain and diarrhea for 1 week found to have acute renal failure, hyperkalemia and severe lactic acidosis of unclear etiology. Patient debated on 12/25/2019 and extubated on 12/26/2019.  Patient back on baseline O2 and following commands appropriately.   Assessment & Plan:   Principal Problem:   AKI (acute kidney injury) ([place]) Active Problems:   Hypertension   Chronic systolic CHF (congestive heart failure) (HCC)   Dyslipidemia   Aortoiliac occlusive disease (HCC)   COPD with chronic bronchitis and emphysema (HCC)   Chronic respiratory failure with hypoxia (HCC)   Lactic acidosis   Uremia   High anion gap metabolic acidosis   Atrial fibrillation, chronic (HCC)   Diabetes mellitus type 2, insulin dependent (HCC)   Hyperkalemia, diminished renal excretion  1 acute metabolic encephalopathy Questionable etiology.  Likely secondary to seizure possibly in the setting of significant uremia, possible old left cortical infarct and acute renal failure.  Patient noted to have a elevated ammonia level of 56.  Patient due to encephalopathy required intubation and subsequently extubated on 12/26/2019.  Head CT done and MRI head negative for any acute abnormalities.  Stat EEG was done which was negative.  Post concern for seizures and patient seen in consultation by neurology.  Patient subsequently placed on Keppra twice daily.  Patient also underwent hemodialysis with nephrology.  Appreciate neurology and nephrology following.  Supportive care.  2.  Acute on chronic hypoxemic respiratory failure likely secondary to COPD exacerbation Clinically improved.  Patient extubated.  Patient back on home O2 at 5 L nasal cannula.  Continue current  regimen of Anoro Ellipta, Mucinex, duo nebs as needed.  IV Solu-Medrol will transition to oral prednisone x5 days.  Pulmonary recommendations.  Outpatient follow-up with pulmonary.  3.  Coffee-ground emesis H&H stable.  Continue PPI.  4.  Acute renal failure on chronic kidney disease stage IIIa/hyperkalemia/metabolic acidosis Secondary to prerenal azotemia in the setting of volume depletion, GI losses from emesis and diarrhea in the setting of ACE inhibitor.  Baseline creatinine noted to be approximately 1.5.  Patient treated for hyperkalemia with resolution of hyperkalemia.  Patient underwent hemodialysis x2.  Nephrology following and per nephrology temporal cath not working well and IR has been requested to convert to a tunneled catheter as patient may have a prolonged recovery per nephrology.  Avoid nephrotoxins.  Per nephrology.  5.  Nausea/vomiting/diarrhea/abdominal pain Resolved.  CT abdomen and pelvis renal stone protocol with no source of patient's pain.  PCCM felt patient could possibly have had an ischemic colitis as patient with a history of peripheral artery disease however no acute findings noted.  Continue supportive care.  6.  Paroxysmal atrial fibrillation/dilated cardiomyopathy/aortic stenosis/peripheral vascular disease Patient noted to have had been lost to follow-up with cardiology in the past with some medication noncompliance.  Patient however insists has been compliant with his medications at home recently.  Patient noted to be on apixaban prior to admission.  Patient currently on IV heparin which we will monitor for now.  Patient improved clinically could likely transition back to apixaban if no further procedures scheduled and no bleeding noted.  Coreg dose decreased due to bradycardia.  7.  Hypertension Continue Norvasc and Coreg.  Diuretics and ACE inhibitor on hold secondary to  problem #4.  8.  Right lower lobe lung nodule 5 mm Repeat follow-up CT imaging in 12 months.   Outpatient follow-up with pulmonary.  9.  Insulin-dependent diabetes mellitus Patient noted to be hypoglycemic on admission.  Hemoglobin A1c of 8.0.  CBG of 96 this morning.  Continue current dose of Lantus and sliding scale insulin.    DVT prophylaxis: Heparin Code Status: Full Family Communication: Updated patient.  No family at bedside. Disposition Plan:  . Patient came from: Home.            . Anticipated d/c place: To be determined likely home. . Barriers to d/c OR conditions which need to be met to effect a safe d/c: Home when clinically improved, cleared by nephrology.   Consultants:   Interventional radiology: Dr. Earleen Newport  Neurology: Dr. Cheral Marker 12/24/2019  Nephrology: Dr. Jonnie Finner 12/24/2019  [facility] hospitalist: Dr. Lorin Mercy 12/24/2019  Procedures:   CT renal stone protocol 12/24/2019  Chest x-ray 12/24/2019, 12/25/2019  MRI brain MRA head 12/24/2019  Tunneled central venous HD catheter placement pending per IR 12/28/2019  Renal ultrasound 12/24/2019  EEG 12/24/2019--- suggestive of moderate to severe diffuse encephalopathy, nonspecific etiology.  Right IJ HD cath 12/24/2019  ETT 12/25/2019>>>> 12/26/2019  Antimicrobials:   [name]  Micro data COVID-19 PCR 12/24/2019 - negative   Subjective: Patient laying in bed.  Denies any chest pain or shortness of breath.  No abdominal pain.  Alert and oriented x3.  Objective: Vitals:   12/27/19 2041 12/28/19 0510 12/28/19 0752 12/28/19 0902  BP: (!) 126/47 136/64  135/69  Pulse: (!) 58 (!) 58 (!) 58 (!) 57  Resp: '16 17 16 20  '$ Temp: 98.2 F (36.8 C) 98.5 F (36.9 C)  97.7 F (36.5 C)  TempSrc:    Oral  SpO2: 92% 93%  92%  Weight:      Height:        Intake/Output Summary (Last 24 hours) at 12/28/2019 1144 Last data filed at 12/28/2019 0900 Gross per 24 hour  Intake 697.43 ml  Output 1150 ml  Net -452.57 ml   Filed Weights   12/26/19 1717 12/27/19 0940 12/27/19 1200  Weight: 68.6 kg 71.8 kg 70.6 kg     Examination:  General exam: Appears calm and comfortable  Respiratory system: Clear to auscultation anterior lung fields.Marland Kitchen Respiratory effort normal. Cardiovascular system: S1 & S2 heard, RRR. No JVD, murmurs, rubs, gallops or clicks. No pedal edema. Gastrointestinal system: Abdomen is nondistended, soft and nontender. No organomegaly or masses felt. Normal bowel sounds heard. Central nervous system: Alert and oriented. No focal neurological deficits. Extremities: Symmetric 5 x 5 power. Skin: No rashes, lesions or ulcers Psychiatry: Judgement and insight appear normal. Mood & affect appropriate.     Data Reviewed: I have personally reviewed following labs and imaging studies  CBC: Recent Labs  Lab 12/25/19 0425 12/25/19 1300 12/26/19 0628 12/27/19 0344 12/28/19 0337  WBC 14.1* 12.5* 11.5* 10.9* 9.5  HGB 13.1 12.5* 12.3* 11.2* 10.3*  HCT 37.5* 35.4* 35.6* 32.6* 30.3*  MCV 85.2 82.1 85.4 84.2 86.6  PLT 290 261 208 141* 121*   Basic Metabolic Panel: Recent Labs  Lab 12/24/19 2035 12/24/19 2051 12/25/19 0326 12/25/19 0326 12/25/19 0425 12/25/19 1052 12/25/19 1855 12/25/19 2124 12/26/19 0629 12/26/19 1700 12/27/19 0344  NA 141   < > 136  --  140  --   --   --  139 136 136  K 6.7*   < > 4.0   < > 4.2  4.2   < > 3.7 4.1 3.5 3.0* 3.9  CL 95*  --   --   --  95*  --   --   --  90* 93* 96*  CO2 13*  --   --   --  18*  --   --   --  '28 26 23  '$ GLUCOSE 93  --   --   --  105*  --   --   --  117* 126* 155*  BUN 149*  --   --   --  90*  --   --   --  120* 36* 84*  CREATININE 13.31*  --   --   --  8.87*  --   --   --  10.70* 4.24* 8.50*  CALCIUM 7.8*  --   --   --  7.8*  --   --   --  7.7* 8.4* 7.8*  MG  --   --   --   --  2.0  --   --   --   --   --   --   PHOS  --   --   --   --  5.5*  5.4*  --   --   --  6.1*  --  6.8*   < > = values in this interval not displayed.   GFR: Estimated Creatinine Clearance: 8.4 mL/min (A) (by C-G formula based on SCr of 8.5 mg/dL  (H)). Liver Function Tests: Recent Labs  Lab 12/24/19 0419 12/25/19 0425 12/26/19 0628 12/26/19 0629 12/27/19 0344  AST 36 37 31  --  25  ALT '28 27 30  '$ --  26  ALKPHOS 65 53 55  --  51  BILITOT 2.5* 1.1 1.5*  --  1.5*  PROT 7.7 6.0* 5.5*  --  5.4*  ALBUMIN 3.0* 2.5*  2.5* 2.3* 2.3* 2.2*  2.2*   Recent Labs  Lab 12/24/19 0419  LIPASE 71*   Recent Labs  Lab 12/25/19 1052  AMMONIA 56*   Coagulation Profile: No results for input(s): INR, PROTIME in the last 168 hours. Cardiac Enzymes: Recent Labs  Lab 12/24/19 0419 12/25/19 1157  CKTOTAL 321 519*   BNP (last 3 results) No results for input(s): PROBNP in the last 8760 hours. HbA1C: No results for input(s): HGBA1C in the last 72 hours. CBG: Recent Labs  Lab 12/27/19 1107 12/27/19 1708 12/27/19 2045 12/28/19 0735 12/28/19 1138  GLUCAP 159* 159* 155* 96 143*   Lipid Profile: No results for input(s): CHOL, HDL, LDLCALC, TRIG, CHOLHDL, LDLDIRECT in the last 72 hours. Thyroid Function Tests: No results for input(s): TSH, T4TOTAL, FREET4, T3FREE, THYROIDAB in the last 72 hours. Anemia Panel: No results for input(s): VITAMINB12, FOLATE, FERRITIN, TIBC, IRON, RETICCTPCT in the last 72 hours. Sepsis Labs: Recent Labs  Lab 12/24/19 0600 12/24/19 0810 12/25/19 1630 12/25/19 1855  LATICACIDVEN 10.2* 9.3* 2.8* 2.8*    Recent Results (from the past 240 hour(s))  SARS CORONAVIRUS 2 (TAT 6-24 HRS) Nasopharyngeal Nasopharyngeal Swab     Status: [name]   Collection Time: 12/24/19  6:54 AM   Specimen: Nasopharyngeal Swab  Result Value Ref Range Status   SARS Coronavirus 2 NEGATIVE NEGATIVE Final    Comment: (NOTE) SARS-CoV-2 target nucleic acids are NOT DETECTED. The SARS-CoV-2 RNA is generally detectable in upper and lower respiratory specimens during the acute phase of infection. Negative results do not preclude SARS-CoV-2 infection, do not rule out co-infections with other pathogens, and  should not be used as  the sole basis for treatment or other patient management decisions. Negative results must be combined with clinical observations, patient history, and epidemiological information. The expected result is Negative. Fact Sheet for Patients: SugarRoll.be Fact Sheet for Healthcare Providers: https://www.woods-mathews.com/ This test is not yet approved or cleared by the [place] FDA and  has been authorized for detection and/or diagnosis of SARS-CoV-2 by FDA under an Emergency Use Authorization (EUA). This EUA will remain  in effect (meaning this test can be used) for the duration of the COVID-19 declaration under Section 56 4(b)(1) of the Act, 21 U.S.C. section 360bbb-3(b)(1), unless the authorization is terminated or revoked sooner. Performed at [facility], [place] [name], [place], [place] [address]   MRSA PCR Screening     Status: [name]   Collection Time: 12/24/19 11:54 AM   Specimen: Nasal Mucosa; Nasopharyngeal  Result Value Ref Range Status   MRSA by PCR NEGATIVE NEGATIVE Final    Comment:        The GeneXpert MRSA Assay (FDA approved for NASAL specimens only), is one component of a comprehensive MRSA colonization surveillance program. It is not intended to diagnose MRSA infection nor to guide or monitor treatment for MRSA infections. Performed at [facility], [place] [address] [place], [place] [address]          Radiology Studies: No results found.      Scheduled Meds: . amLODipine  5 mg Oral Daily  . carvedilol  6.25 mg Oral BID WC  . Chlorhexidine Gluconate Cloth  6 each Topical Daily  . feeding supplement (ENSURE ENLIVE)  237 mL Oral TID BM  . guaiFENesin  600 mg Oral BID  . insulin aspart  0-5 Units Subcutaneous QHS  . insulin aspart  0-9 Units Subcutaneous TID WC  . insulin glargine  10 Units Subcutaneous Daily  . levETIRAcetam  500 mg Oral BID  . pantoprazole  40 mg Oral Daily  .  umeclidinium-vilanterol  1 puff Inhalation Daily   Continuous Infusions: . sodium chloride 250 mL (12/27/19 1954)  . heparin 800 Units/hr (12/28/19 0501)     LOS: 4 days    Time spent: 40 minutes.    Irine Seal, [facility]   To contact the attending provider between 7A-7P or the covering provider during after hours 7P-7A, please log into the web site [email or web address] and access using universal [name] password for that web site. If you do not have the password, please call the hospital operator.  12/28/2019, 11:44 AM

## 2019-12-29 DIAGNOSIS — J9621 Acute and chronic respiratory failure with hypoxia: Secondary | ICD-10-CM

## 2019-12-29 LAB — HEPARIN LEVEL (UNFRACTIONATED): Heparin Unfractionated: <0.1 IU/mL — ABNORMAL LOW (ref 0.30–0.70)

## 2019-12-29 LAB — BASIC METABOLIC PANEL
Anion gap: 14 (ref 5–15)
BUN: 84 mg/dL — ABNORMAL HIGH (ref 8–23)
CO2: 24 mmol/L (ref 22–32)
Calcium: 7.9 mg/dL — ABNORMAL LOW (ref 8.9–10.3)
Chloride: 98 mmol/L (ref 98–111)
Creatinine, Ser: 7.78 mg/dL — ABNORMAL HIGH (ref 0.61–1.24)
GFR calc Af Amer: 8 mL/min — ABNORMAL LOW (ref >60)
GFR calc non Af Amer: 6 mL/min — ABNORMAL LOW (ref >60)
Glucose, Bld: 236 mg/dL — ABNORMAL HIGH (ref 70–99)
Potassium: 4.5 mmol/L (ref 3.5–5.1)
Sodium: 136 mmol/L (ref 135–145)

## 2019-12-29 LAB — GLUCOSE, CAPILLARY
Glucose-Capillary: 216 mg/dL — ABNORMAL HIGH (ref 70–99)
Glucose-Capillary: 205 mg/dL — ABNORMAL HIGH (ref 70–99)
Glucose-Capillary: 150 mg/dL — ABNORMAL HIGH (ref 70–99)

## 2019-12-29 LAB — CBC
HCT: 32.8 % — ABNORMAL LOW (ref 39.0–52.0)
Hemoglobin: 11 g/dL — ABNORMAL LOW (ref 13.0–17.0)
MCH: 29.1 pg (ref 26.0–34.0)
MCHC: 33.5 g/dL (ref 30.0–36.0)
MCV: 86.8 fL (ref 80.0–100.0)
Platelets: 124 10*3/uL — ABNORMAL LOW (ref 150–400)
RBC: 3.78 MIL/uL — ABNORMAL LOW (ref 4.22–5.81)
RDW: 14.2 % (ref 11.5–15.5)
WBC: 7.9 10*3/uL (ref 4.0–10.5)
nRBC: 0 % (ref 0.0–0.2)

## 2019-12-29 MED ORDER — WARFARIN SODIUM 7.5 MG PO TABS
7.5000 mg | ORAL_TABLET | Freq: Once | ORAL | Status: AC
  Administered 2019-12-29: 7.5 mg via ORAL
  Filled 2019-12-29 (×2): qty 1

## 2019-12-29 MED ORDER — HEPARIN SODIUM (PORCINE) 1000 UNIT/ML IJ SOLN
INTRAMUSCULAR | Status: AC
  Filled 2019-12-29: qty 4

## 2019-12-29 MED ORDER — INSULIN GLARGINE 100 UNIT/ML ~~LOC~~ SOLN
14.0000 [IU] | Freq: Every day | SUBCUTANEOUS | Status: DC
  Administered 2019-12-29 – 2020-01-05 (×8): 14 [IU] via SUBCUTANEOUS
  Filled 2019-12-29 (×8): qty 0.14

## 2019-12-29 MED ORDER — WARFARIN - PHARMACIST DOSING INPATIENT: Freq: Every day | Status: DC

## 2019-12-29 NOTE — Progress Notes (Signed)
[facility] Progress Note   69 y.o.year-old with h/o pulm HTN, PAD (sp aorto-bifem bypass '16), HTN, DCM EF 20-25% (remote echo), COPD presented w/ abd pain , nausea/ vomiting and diarrhea x1wk + mid upper abd pain. Has COPD on 4L home O2. In ED labs showed creat 14, K 6.3, on lasix and ace inhibitor at home. Pt admitted to CCM. Asked to see for AKI; cr 1.47 on 2/4/21w/ BLcr from Jun 2020-feb 2021 1.4- 1.7.   Assessment/ Plan:   1. AoCKD 3 - in setting of vol depletion/ vomiting/ diarrhea and ACEi. B/l creat is 1.5.Txfor hyperkalemia and po K+ lowering meds; K better now.   - very poor treatment on Wed bec of prior temp cath.  - Appreciate Dr. Earleen Newport (VIR) placing RIJ TC; pt not complaining of pain. No e/o recovery.   - Will plan on HD today and then give weekend off. If no signs of recovery by Monday will start CLIP process for AKI.  2. DM on insulin 3. Vol depletion - treated 4. Lactic / metabolic acidosis - severebut now resolved.  5. COPD - on homeO2.  Subjective:   More awake now and interactive, pleasant. No events overnight Not c/o pain from TC placement   Objective:   BP 134/60 (BP Location: Left Arm)   Pulse 60   Temp (!) 97.3 F (36.3 C) (Oral)   Resp 16   Ht 5\' 11"  (1.803 m)   Wt 72.4 kg   SpO2 97%   BMI 22.26 kg/m   Intake/Output Summary (Last 24 hours) at 12/29/2019 0710 Last data filed at 12/29/2019 0600 Gross per 24 hour  Intake 1052.39 ml  Output 150 ml  Net 902.39 ml   Weight change: 0.6 kg  Physical Exam: Genin bed converses now, very pleasant No rash, cyanosis or gangrene No jvd or bruits Chest clear bilatto bases, no rales, +rhonchi RRR no MRG Abd soft ntnd no massor ascites ExttrLEedema, no wounds or ulcers Access: RIJtemp GU: foley to gravity  Imaging: IR Fluoro Guide CV Line Right  Result Date: 12/28/2019 INDICATION: 69 year old male with a history of renal failure EXAM: TUNNELED CENTRAL VENOUS  HEMODIALYSIS CATHETER PLACEMENT WITH ULTRASOUND AND FLUOROSCOPIC GUIDANCE MEDICATIONS: 2 g Ancef. The antibiotic was given in an appropriate time interval prior to skin puncture. ANESTHESIA/SEDATION: Moderate (conscious) sedation was employed during this procedure. A total of Versed 1.0 mg and Fentanyl 50 mcg was administered intravenously. Moderate Sedation Time: 11 minutes. The patient's level of consciousness and vital signs were monitored continuously by radiology nursing throughout the procedure under my direct supervision. FLUOROSCOPY TIME:  Fluoroscopy Time: 0 minutes 12 seconds (1 mGy). COMPLICATIONS: None PROCEDURE: The procedure, risks, benefits, and alternatives were explained to the patient. Questions regarding the procedure were encouraged and answered. The patient understands and consents to the procedure. The right neck and chest was prepped with chlorhexidine, and draped in the usual sterile fashion using maximum barrier technique (cap and mask, sterile gown, sterile gloves, large sterile sheet, hand hygiene and cutaneous antiseptic). Antibiotic prophylaxis was initiated with 2.0g Ancef administered IV within 1 hour prior to skin incision. Local anesthesia was attained by infiltration with 1% lidocaine without epinephrine. Ultrasound demonstrated patency of the right internal jugular vein, and this was documented with an image. Under real-time ultrasound guidance, this vein was accessed with a 21 gauge micropuncture needle and image documentation was performed. A small dermatotomy was made at the access site with an 11 scalpel. A 0.018" wire was advanced  into the SVC and the access needle exchanged for a 36F micropuncture vascular sheath. The 0.018" wire was then removed and a 0.035" wire advanced into the IVC. Upon withdrawal of the 018 wire, the wire was marked for appropriate length of the internal portion of the catheter. A 19 cm tip to cuff catheter was selected. The skin and subcutaneous tissues  of the right chest wall were then generously infiltrated with 1% lidocaine without epinephrine from the chest wall to the puncture site at the right IJ. The hemodialysis catheter was then back tunneled from the right chest wall incision to the dermatotomy at the right neck. The venous access site was then serially dilated and a peel away vascular sheath placed over the wire. The wire was removed and the catheter was placed through the peel-away sheath. The catheter tip is positioned in the upper right atrium. This was documented with a spot image. Both ports of the hemodialysis catheter were then tested for excellent function. The ports were then locked with heparinized lock. The incision at the neck was then sealed with Dermabond. Patient tolerated the procedure well and remained hemodynamically stable throughout. No complications were encountered and no significant blood loss was encountered. IMPRESSION: Status post right IJ tunneled hemodialysis catheter. Catheter ready for use. Signed, Dulcy Fanny. Dellia Nims, RPVI [facility] Radiology Electronically Signed   By: Corrie Mckusick D.O.   On: 12/28/2019 16:22   IR US Guide Vasc Access Right  Result Date: 12/28/2019 INDICATION: 69 year old male with a history of renal failure EXAM: TUNNELED CENTRAL VENOUS HEMODIALYSIS CATHETER PLACEMENT WITH ULTRASOUND AND FLUOROSCOPIC GUIDANCE MEDICATIONS: 2 g Ancef. The antibiotic was given in an appropriate time interval prior to skin puncture. ANESTHESIA/SEDATION: Moderate (conscious) sedation was employed during this procedure. A total of Versed 1.0 mg and Fentanyl 50 mcg was administered intravenously. Moderate Sedation Time: 11 minutes. The patient's level of consciousness and vital signs were monitored continuously by radiology nursing throughout the procedure under my direct supervision. FLUOROSCOPY TIME:  Fluoroscopy Time: 0 minutes 12 seconds (1 mGy). COMPLICATIONS: None  PROCEDURE: The procedure, risks, benefits, and alternatives were explained to the patient. Questions regarding the procedure were encouraged and answered. The patient understands and consents to the procedure. The right neck and chest was prepped with chlorhexidine, and draped in the usual sterile fashion using maximum barrier technique (cap and mask, sterile gown, sterile gloves, large sterile sheet, hand hygiene and cutaneous antiseptic). Antibiotic prophylaxis was initiated with 2.0g Ancef administered IV within 1 hour prior to skin incision. Local anesthesia was attained by infiltration with 1% lidocaine without epinephrine. Ultrasound demonstrated patency of the right internal jugular vein, and this was documented with an image. Under real-time ultrasound guidance, this vein was accessed with a 21 gauge micropuncture needle and image documentation was performed. A small dermatotomy was made at the access site with an 11 scalpel. A 0.018" wire was advanced into the SVC and the access needle exchanged for a 36F micropuncture vascular sheath. The 0.018" wire was then removed and a 0.035" wire advanced into the IVC. Upon withdrawal of the 018 wire, the wire was marked for appropriate length of the internal portion of the catheter. A 19 cm tip to cuff catheter was selected. The skin and subcutaneous tissues of the right chest wall were then generously infiltrated with 1% lidocaine without epinephrine from the chest wall to the puncture site at the right IJ. The hemodialysis catheter was then back tunneled from the right  chest wall incision to the dermatotomy at the right neck. The venous access site was then serially dilated and a peel away vascular sheath placed over the wire. The wire was removed and the catheter was placed through the peel-away sheath. The catheter tip is positioned in the upper right atrium. This was documented with a spot image. Both ports of the hemodialysis catheter were then tested for  excellent function. The ports were then locked with heparinized lock. The incision at the neck was then sealed with Dermabond. Patient tolerated the procedure well and remained hemodynamically stable throughout. No complications were encountered and no significant blood loss was encountered. IMPRESSION: Status post right IJ tunneled hemodialysis catheter. Catheter ready for use. Signed, Dulcy Fanny. Dellia Nims, RPVI [facility] Radiology Electronically Signed   By: Corrie Mckusick D.O.   On: 12/28/2019 16:22    Labs: BMET Recent Labs  Lab 12/24/19 2035 12/24/19 2051 12/25/19 0326 12/25/19 0326 12/25/19 0425 12/25/19 1052 12/25/19 1855 12/25/19 2124 12/26/19 0629 12/26/19 1700 12/27/19 0344 12/28/19 1113 12/29/19 0515  NA 141   < > 136  --  140  --   --   --  139 136 136 137 136  K 6.7*   < > 4.0   < > 4.2  4.2   < > 3.7 4.1 3.5 3.0* 3.9 3.8 4.5  CL 95*  --   --   --  95*  --   --   --  90* 93* 96* 99 98  CO2 13*  --   --   --  18*  --   --   --  28 26 23 27 24   GLUCOSE 93  --   --   --  105*  --   --   --  117* 126* 155* 156* 236*  BUN 149*  --   --   --  90*  --   --   --  120* 36* 84* 66* 84*  CREATININE 13.31*  --   --   --  8.87*  --   --   --  10.70* 4.24* 8.50* 6.85* 7.78*  CALCIUM 7.8*  --   --   --  7.8*  --   --   --  7.7* 8.4* 7.8* 7.6* 7.9*  PHOS  --   --   --   --  5.5*  5.4*  --   --   --  6.1*  --  6.8* 2.8  --    < > = values in this interval not displayed.   CBC Recent Labs  Lab 12/26/19 0628 12/27/19 0344 12/28/19 0337 12/29/19 0515  WBC 11.5* 10.9* 9.5 7.9  HGB 12.3* 11.2* 10.3* 11.0*  HCT 35.6* 32.6* 30.3* 32.8*  MCV 85.4 84.2 86.6 86.8  PLT 208 141* 119* 124*    Medications:    . amLODipine  5 mg Oral Daily  . carvedilol  6.25 mg Oral BID WC  . Chlorhexidine Gluconate Cloth  6 each Topical Daily  . feeding supplement (ENSURE ENLIVE)  237 mL Oral TID BM  . guaiFENesin  600 mg Oral BID  . insulin aspart   0-5 Units Subcutaneous QHS  . insulin aspart  0-9 Units Subcutaneous TID WC  . insulin glargine  10 Units Subcutaneous Daily  . levETIRAcetam  500 mg Oral BID  . pantoprazole  40 mg Oral Daily  . predniSONE  40 mg Oral QAC breakfast  . umeclidinium-vilanterol  1 puff Inhalation Daily      Otelia Santee, MD 12/29/2019, 7:10 AM

## 2019-12-29 NOTE — Progress Notes (Signed)
PROGRESS NOTE    Kyle [name]  BOF:[id number] DOB: 02/01/1951 DOA: 12/24/2019 PCP: Alonna Buckler, MD    Brief Narrative:  69 year old man with a history of heavy smoking, COPD, peripheral vascular disease presenting with nausea, intermittent vomiting, abd cramping/pain and diarrhea for 1 week found to have acute renal failure, hyperkalemia and severe lactic acidosis of unclear etiology. Patient debated on 12/25/2019 and extubated on 12/26/2019.  Patient back on baseline O2 and following commands appropriately.   Assessment & Plan:   Principal Problem:   Acute renal failure (HCC) Active Problems:   Hypertension   Chronic systolic CHF (congestive heart failure) (HCC)   Dyslipidemia   Aortoiliac occlusive disease (HCC)   COPD with chronic bronchitis and emphysema (HCC)   Chronic respiratory failure with hypoxia (HCC)   Lactic acidosis   Uremia   High anion gap metabolic acidosis   Atrial fibrillation, chronic (HCC)   Diabetes mellitus type 2, insulin dependent (HCC)   Hyperkalemia, diminished renal excretion   Acute metabolic encephalopathy  1 acute metabolic encephalopathy Questionable etiology.  Likely secondary to seizure possibly in the setting of significant uremia, possible old left cortical infarct and acute renal failure.  Patient noted to have a elevated ammonia level of 56.  Patient due to encephalopathy required intubation and subsequently extubated on 12/26/2019.  Head CT done and MRI head negative for any acute abnormalities.  Stat EEG was done which was negative.  Post concern for seizures and patient seen in consultation by neurology.  Patient subsequently placed on Keppra twice daily.  Patient also underwent hemodialysis with nephrology.  Appreciate neurology and nephrology following.  Supportive care.  2.  Acute on chronic hypoxemic respiratory failure likely secondary to COPD exacerbation Clinically improved.  Patient extubated.  Patient back on home O2 at 5 L  nasal cannula.  Continue current regimen of Anoro Ellipta, Mucinex, duo nebs as needed.  IV Solu-Medrol has been transitioned to oral prednisone x5 days per pulmonary recommendations.  Will need to assess O2 requirements on discharge with goal on supplemental oxygen with sats of 88- 92%.  Outpatient follow-up with pulmonary.  3.  Coffee-ground emesis H&H stable.  Continue PPI.  4.  Acute renal failure on chronic kidney disease stage IIIa/hyperkalemia/metabolic acidosis Secondary to prerenal azotemia in the setting of volume depletion, GI losses from emesis and diarrhea in the setting of ACE inhibitor.  Baseline creatinine noted to be approximately 1.5.  Patient treated for hyperkalemia with resolution of hyperkalemia.  Patient underwent hemodialysis x2.  Nephrology following and per nephrology temporal cath not working well and IR has been requested to convert to a tunneled catheter which was done on 12/28/2019 without any complications.  Patient likely for hemodialysis today.  Continue to avoid nephrotoxins.  Per nephrology.   5.  Nausea/vomiting/diarrhea/abdominal pain Resolved.  CT abdomen and pelvis renal stone protocol with no source of patient's pain.  PCCM felt patient could possibly have had an ischemic colitis as patient with a history of peripheral artery disease however no acute findings noted.  Continue supportive care.  6.  Paroxysmal atrial fibrillation/dilated cardiomyopathy/aortic stenosis/peripheral vascular disease Patient noted to have had been lost to follow-up with cardiology in the past with some medication noncompliance.  Patient however insists has been compliant with his medications at home recently.  Patient noted to be on apixaban prior to admission.  Patient currently on IV heparin which we will monitor for now.  Patient was on apixaban however due to renal function patient  likely not a candidate for apixaban anymore and as such we will start patient on Coumadin for  anticoagulation.  Coreg dose was decreased secondary to bradycardia.   7.  Hypertension Blood pressure stable on current regimen of Norvasc and Coreg.  Diuretics and ACE inhibitor on hold secondary to problem #4.  Follow.   8.  Right lower lobe lung nodule 5 mm Repeat follow-up CT imaging in 12 months.  Outpatient follow-up with pulmonary.  9.  Insulin-dependent diabetes mellitus Patient noted to be hypoglycemic on admission.  Hemoglobin A1c of 8.0.  CBG of 216 this morning.  Increase Lantus to 14 units daily.  Continue sliding scale insulin.     DVT prophylaxis: Heparin Code Status: Full Family Communication: Updated patient.  No family at bedside. Disposition Plan:  . Patient came from: Home.            . Anticipated d/c place: To be determined likely home. . Barriers to d/c OR conditions which need to be met to effect a safe d/c: Home when clinically improved, cleared by nephrology.   Consultants:   Interventional radiology: Dr. Earleen Newport  Neurology: Dr. Cheral Marker 12/24/2019  Nephrology: Dr. Jonnie Finner 12/24/2019  [facility] hospitalist: Dr. Lorin Mercy 12/24/2019  Procedures:   CT renal stone protocol 12/24/2019  Chest x-ray 12/24/2019, 12/25/2019  MRI brain MRA head 12/24/2019  Tunneled central venous HD catheter placement per IR 12/28/2019  Renal ultrasound 12/24/2019  EEG 12/24/2019--- suggestive of moderate to severe diffuse encephalopathy, nonspecific etiology.  Right IJ HD cath 12/24/2019  ETT 12/25/2019>>>> 12/26/2019  Antimicrobials:   None  Micro data COVID-19 PCR 12/24/2019 - negative   Subjective: Patient sitting up in bed.  Watching television.  Denies any chest pain or shortness of breath.  Denies any bleeding.   Objective: Vitals:   12/28/19 2141 12/29/19 0507 12/29/19 0808 12/29/19 0906  BP: 125/60 134/60  (!) 155/71  Pulse: 64 60  63  Resp: '18 16  18  '$ Temp: (!) 97.3 F (36.3 C) (!) 97.3 F (36.3 C)  (!) 97.5 F (36.4 C)  TempSrc: Oral Oral  Oral  SpO2: 92% 97%  90% 92%  Weight: 72.4 kg     Height:        Intake/Output Summary (Last 24 hours) at 12/29/2019 1214 Last data filed at 12/29/2019 0900 Gross per 24 hour  Intake 1172.39 ml  Output 150 ml  Net 1022.39 ml   Filed Weights   12/27/19 0940 12/27/19 1200 12/28/19 2141  Weight: 71.8 kg 70.6 kg 72.4 kg    Examination:  General exam: NAD Respiratory system: CTAB.  No wheezes, no crackles, no rhonchi.  Normal respiratory effort. Cardiovascular system: RRR.  No murmurs rubs or gallops.  No JVD.  No lower extremity edema. Gastrointestinal system: Abdomen is soft, nontender, nondistended, positive bowel sounds.  No rebound.  No guarding.   Central nervous system: Alert and oriented. No focal neurological deficits. Extremities: Symmetric 5 x 5 power. Skin: No rashes, lesions or ulcers Psychiatry: Judgement and insight appear normal. Mood & affect appropriate.     Data Reviewed: I have personally reviewed following labs and imaging studies  CBC: Recent Labs  Lab 12/25/19 1300 12/26/19 0628 12/27/19 0344 12/28/19 0337 12/29/19 0515  WBC 12.5* 11.5* 10.9* 9.5 7.9  HGB 12.5* 12.3* 11.2* 10.3* 11.0*  HCT 35.4* 35.6* 32.6* 30.3* 32.8*  MCV 82.1 85.4 84.2 86.6 86.8  PLT 261 208 141* 119* 762*   Basic Metabolic Panel: Recent Labs  Lab 12/25/19 0425 12/25/19 1052  12/26/19 0629 12/26/19 1700 12/27/19 0344 12/28/19 1113 12/29/19 0515  NA 140  --  139 136 136 137 136  K 4.2  4.2   < > 3.5 3.0* 3.9 3.8 4.5  CL 95*  --  90* 93* 96* 99 98  CO2 18*  --  '28 26 23 27 24  '$ GLUCOSE 105*  --  117* 126* 155* 156* 236*  BUN 90*  --  120* 36* 84* 66* 84*  CREATININE 8.87*  --  10.70* 4.24* 8.50* 6.85* 7.78*  CALCIUM 7.8*  --  7.7* 8.4* 7.8* 7.6* 7.9*  MG 2.0  --   --   --   --  1.9  --   PHOS 5.5*  5.4*  --  6.1*  --  6.8* 2.8  --    < > = values in this interval not displayed.   GFR: Estimated Creatinine Clearance: 9.4 mL/min (A) (by C-G formula based on SCr of 7.78 mg/dL (H)). Liver  Function Tests: Recent Labs  Lab 12/24/19 0419 12/24/19 0419 12/25/19 0425 12/26/19 0628 12/26/19 0629 12/27/19 0344 12/28/19 1113  AST 36  --  37 31  --  25  --   ALT 28  --  27 30  --  26  --   ALKPHOS 65  --  53 55  --  51  --   BILITOT 2.5*  --  1.1 1.5*  --  1.5*  --   PROT 7.7  --  6.0* 5.5*  --  5.4*  --   ALBUMIN 3.0*   < > 2.5*  2.5* 2.3* 2.3* 2.2*  2.2* 2.1*   < > = values in this interval not displayed.   Recent Labs  Lab 12/24/19 0419  LIPASE 71*   Recent Labs  Lab 12/25/19 1052  AMMONIA 56*   Coagulation Profile: Recent Labs  Lab 12/28/19 1113  INR 1.0   Cardiac Enzymes: Recent Labs  Lab 12/24/19 0419 12/25/19 1157  CKTOTAL 321 519*   BNP (last 3 results) No results for input(s): PROBNP in the last 8760 hours. HbA1C: No results for input(s): HGBA1C in the last 72 hours. CBG: Recent Labs  Lab 12/28/19 1138 12/28/19 1642 12/28/19 2134 12/29/19 0647 12/29/19 1123  GLUCAP 143* 89 208* 216* 205*   Lipid Profile: No results for input(s): CHOL, HDL, LDLCALC, TRIG, CHOLHDL, LDLDIRECT in the last 72 hours. Thyroid Function Tests: No results for input(s): TSH, T4TOTAL, FREET4, T3FREE, THYROIDAB in the last 72 hours. Anemia Panel: No results for input(s): VITAMINB12, FOLATE, FERRITIN, TIBC, IRON, RETICCTPCT in the last 72 hours. Sepsis Labs: Recent Labs  Lab 12/24/19 0600 12/24/19 0810 12/25/19 1630 12/25/19 1855  LATICACIDVEN 10.2* 9.3* 2.8* 2.8*    Recent Results (from the past 240 hour(s))  SARS CORONAVIRUS 2 (TAT 6-24 HRS) Nasopharyngeal Nasopharyngeal Swab     Status: None   Collection Time: 12/24/19  6:54 AM   Specimen: Nasopharyngeal Swab  Result Value Ref Range Status   SARS Coronavirus 2 NEGATIVE NEGATIVE Final    Comment: (NOTE) SARS-CoV-2 target nucleic acids are NOT DETECTED. The SARS-CoV-2 RNA is generally detectable in upper and lower respiratory specimens during the acute phase of infection. Negative results do not  preclude SARS-CoV-2 infection, do not rule out co-infections with other pathogens, and should not be used as the sole basis for treatment or other patient management decisions. Negative results must be combined with clinical observations, patient history, and epidemiological information. The expected result is Negative. Fact Sheet for  Patients: SugarRoll.be Fact Sheet for Healthcare Providers: https://www.woods-mathews.com/ This test is not yet approved or cleared by the [place] FDA and  has been authorized for detection and/or diagnosis of SARS-CoV-2 by FDA under an Emergency Use Authorization (EUA). This EUA will remain  in effect (meaning this test can be used) for the duration of the COVID-19 declaration under Section 56 4(b)(1) of the Act, 21 U.S.C. section 360bbb-3(b)(1), unless the authorization is terminated or revoked sooner. Performed at [facility], [place] [address], [place], [place] [address]   MRSA PCR Screening     Status: None   Collection Time: 12/24/19 11:54 AM   Specimen: Nasal Mucosa; Nasopharyngeal  Result Value Ref Range Status   MRSA by PCR NEGATIVE NEGATIVE Final    Comment:        The GeneXpert MRSA Assay (FDA approved for NASAL specimens only), is one component of a comprehensive MRSA colonization surveillance program. It is not intended to diagnose MRSA infection nor to guide or monitor treatment for MRSA infections. Performed at [facility], [place] [address] [place], [place] [address]          Radiology Studies: IR Fluoro Guide CV Line Right  Result Date: 12/28/2019 INDICATION: 69 year old male with a history of renal failure EXAM: TUNNELED CENTRAL VENOUS HEMODIALYSIS CATHETER PLACEMENT WITH ULTRASOUND AND FLUOROSCOPIC GUIDANCE MEDICATIONS: 2 g Ancef. The antibiotic was given in an appropriate time interval prior to skin puncture. ANESTHESIA/SEDATION: Moderate (conscious) sedation was  employed during this procedure. A total of Versed 1.0 mg and Fentanyl 50 mcg was administered intravenously. Moderate Sedation Time: 11 minutes. The patient's level of consciousness and vital signs were monitored continuously by radiology nursing throughout the procedure under my direct supervision. FLUOROSCOPY TIME:  Fluoroscopy Time: 0 minutes 12 seconds (1 mGy). COMPLICATIONS: None PROCEDURE: The procedure, risks, benefits, and alternatives were explained to the patient. Questions regarding the procedure were encouraged and answered. The patient understands and consents to the procedure. The right neck and chest was prepped with chlorhexidine, and draped in the usual sterile fashion using maximum barrier technique (cap and mask, sterile gown, sterile gloves, large sterile sheet, hand hygiene and cutaneous antiseptic). Antibiotic prophylaxis was initiated with 2.0g Ancef administered IV within 1 hour prior to skin incision. Local anesthesia was attained by infiltration with 1% lidocaine without epinephrine. Ultrasound demonstrated patency of the right internal jugular vein, and this was documented with an image. Under real-time ultrasound guidance, this vein was accessed with a 21 gauge micropuncture needle and image documentation was performed. A small dermatotomy was made at the access site with an 11 scalpel. A 0.018" wire was advanced into the SVC and the access needle exchanged for a 13F micropuncture vascular sheath. The 0.018" wire was then removed and a 0.035" wire advanced into the IVC. Upon withdrawal of the 018 wire, the wire was marked for appropriate length of the internal portion of the catheter. A 19 cm tip to cuff catheter was selected. The skin and subcutaneous tissues of the right chest wall were then generously infiltrated with 1% lidocaine without epinephrine from the chest wall to the puncture site at the right IJ. The hemodialysis catheter was then back tunneled from the right chest wall  incision to the dermatotomy at the right neck. The venous access site was then serially dilated and a peel away vascular sheath placed over the wire. The wire was removed and the catheter was placed through the peel-away sheath. The catheter tip is positioned  in the upper right atrium. This was documented with a spot image. Both ports of the hemodialysis catheter were then tested for excellent function. The ports were then locked with heparinized lock. The incision at the neck was then sealed with Dermabond. Patient tolerated the procedure well and remained hemodynamically stable throughout. No complications were encountered and no significant blood loss was encountered. IMPRESSION: Status post right IJ tunneled hemodialysis catheter. Catheter ready for use. Signed, Dulcy Fanny. Dellia Nims, RPVI [facility] Electronically Signed   By: Corrie Mckusick D.O.   On: 12/28/2019 16:22   IR US Guide Vasc Access Right  Result Date: 12/28/2019 INDICATION: 69 year old male with a history of renal failure EXAM: TUNNELED CENTRAL VENOUS HEMODIALYSIS CATHETER PLACEMENT WITH ULTRASOUND AND FLUOROSCOPIC GUIDANCE MEDICATIONS: 2 g Ancef. The antibiotic was given in an appropriate time interval prior to skin puncture. ANESTHESIA/SEDATION: Moderate (conscious) sedation was employed during this procedure. A total of Versed 1.0 mg and Fentanyl 50 mcg was administered intravenously. Moderate Sedation Time: 11 minutes. The patient's level of consciousness and vital signs were monitored continuously by radiology nursing throughout the procedure under my direct supervision. FLUOROSCOPY TIME:  Fluoroscopy Time: 0 minutes 12 seconds (1 mGy). COMPLICATIONS: None PROCEDURE: The procedure, risks, benefits, and alternatives were explained to the patient. Questions regarding the procedure were encouraged and answered. The patient understands and consents to the procedure. The right neck and  chest was prepped with chlorhexidine, and draped in the usual sterile fashion using maximum barrier technique (cap and mask, sterile gown, sterile gloves, large sterile sheet, hand hygiene and cutaneous antiseptic). Antibiotic prophylaxis was initiated with 2.0g Ancef administered IV within 1 hour prior to skin incision. Local anesthesia was attained by infiltration with 1% lidocaine without epinephrine. Ultrasound demonstrated patency of the right internal jugular vein, and this was documented with an image. Under real-time ultrasound guidance, this vein was accessed with a 21 gauge micropuncture needle and image documentation was performed. A small dermatotomy was made at the access site with an 11 scalpel. A 0.018" wire was advanced into the SVC and the access needle exchanged for a 2F micropuncture vascular sheath. The 0.018" wire was then removed and a 0.035" wire advanced into the IVC. Upon withdrawal of the 018 wire, the wire was marked for appropriate length of the internal portion of the catheter. A 19 cm tip to cuff catheter was selected. The skin and subcutaneous tissues of the right chest wall were then generously infiltrated with 1% lidocaine without epinephrine from the chest wall to the puncture site at the right IJ. The hemodialysis catheter was then back tunneled from the right chest wall incision to the dermatotomy at the right neck. The venous access site was then serially dilated and a peel away vascular sheath placed over the wire. The wire was removed and the catheter was placed through the peel-away sheath. The catheter tip is positioned in the upper right atrium. This was documented with a spot image. Both ports of the hemodialysis catheter were then tested for excellent function. The ports were then locked with heparinized lock. The incision at the neck was then sealed with Dermabond. Patient tolerated the procedure well and remained hemodynamically stable throughout. No complications were  encountered and no significant blood loss was encountered. IMPRESSION: Status post right IJ tunneled hemodialysis catheter. Catheter ready for use. Signed, Dulcy Fanny. Dellia Nims, [place] [facility] Electronically Signed   By: Corrie Mckusick D.O.  On: 12/28/2019 16:22        Scheduled Meds: . amLODipine  5 mg Oral Daily  . carvedilol  6.25 mg Oral BID WC  . Chlorhexidine Gluconate Cloth  6 each Topical Daily  . feeding supplement (ENSURE ENLIVE)  237 mL Oral TID BM  . guaiFENesin  600 mg Oral BID  . insulin aspart  0-5 Units Subcutaneous QHS  . insulin aspart  0-9 Units Subcutaneous TID WC  . insulin glargine  14 Units Subcutaneous Daily  . levETIRAcetam  500 mg Oral BID  . pantoprazole  40 mg Oral Daily  . predniSONE  40 mg Oral QAC breakfast  . umeclidinium-vilanterol  1 puff Inhalation Daily   Continuous Infusions: . sodium chloride 250 mL (12/29/19 0532)  . heparin 1,000 Units/hr (12/29/19 0804)     LOS: 5 days    Time spent: 40 minutes.    Irine Seal, MD [facility]   To contact the attending provider between 7A-7P or the covering provider during after hours 7P-7A, please log into the web site [email or web address] and access using universal [place] password for that web site. If you do not have the password, please call the hospital operator.  12/29/2019, 12:14 PM

## 2019-12-29 NOTE — Progress Notes (Signed)
Received call from hemodialysis nurse that heparin peripheral intravenous site is out at this time.I.V. team notified.

## 2019-12-29 NOTE — Progress Notes (Signed)
   NAME:  ROSE HIPPLER, MRN:  [id number], DOB:  1951-05-11, LOS: 5 ADMISSION DATE:  12/24/2019, CONSULTATION DATE:  12/24/19 REFERRING MD:  ER, CHIEF COMPLAINT:  Abd pain   Brief History   69 year old man with a history of heavy smoking, COPD, peripheral vascular disease presenting with nausea, intermittent vomiting, abd cramping/pain and diarrhea for 1 week found to have acute renal failure, hyperkalemia and severe lactic acidosis of unclear etiology.  Past Medical History  COPD on home oxygen 5L Severe peripheral vascular disease History of heavy smoking Insulin-dependent diabetes  Significant Hospital Events   2/28 Admitted 3/01 Intubated overnight due to worsening mental status 3/02 Extubated  Consults:  Nephrology, Hospitalist, PCCM  Procedures:  R IJ HD 2/28 >>  ETT 3/1 >> 3/2  Significant Diagnostic Tests:   CT Renal 2/28 >> No acute findings in the abdomen or pelvis.  Minimal left lower lobe collapse/consolidation.  5 mm right lower lobe pulmonary nodule.    MRI Brain 2/28 >> no acute findings  MRA Brain 2/28 >> motion degraded, no acute findings  EEG 3/2 >> suggestive of moderate to severe diffuse encephalopathy, non-specific etiology  Micro Data:  COVID 2/28 >> negative  Antimicrobials:     Interim history/subjective:  Has done well since extubation, no chest pain or dyspnea Saturation 92 to 97% on 5 L nasal cannula, able to lie supine  Objective   Blood pressure (!) 155/71, pulse 63, temperature (!) 97.5 F (36.4 C), temperature source Oral, resp. rate 18, height 5\' 11"  (1.803 m), weight 72.4 kg, SpO2 92 %.        Intake/Output Summary (Last 24 hours) at 12/29/2019 1029 Last data filed at 12/29/2019 0900 Gross per 24 hour  Intake 1172.39 ml  Output 150 ml  Net 1022.39 ml   Filed Weights   12/27/19 0940 12/27/19 1200 12/28/19 2141  Weight: 71.8 kg 70.6 kg 72.4 kg   Physical Exam: General: Elderly man, on nasal cannula, no acute distress HENT: No  JVD, mild pallor, no icterus Respiratory: Clear to auscultation bilaterally.  No crackles, wheezing or rales Cardiovascular: RRR, -M/R/G, no JVD GI: BS+, soft, nontender Extremities:-Edema,-tenderness Neuro: AAO x4, grossly nonfocal Psych: Normal mood, normal affect  Labs show normal electrolytes, stable anemia, no leukocytosis   Resolved Hospital Problem list      Assessment & Plan:   Acute Metabolic Encephalopathy - improving Ammonia 56.  CT head and MRI negative for acute findings. ?seizure however EEG negative.  --Continue Keppra BID  Acute on Chronic Hypoxemic Respiratory Failure - improved. On home O2, not hypercarbic COPD exacerbation -previously was on room air at rest and 2 to 4 L on exertion --Continue steroids x 5 days --Resume home Anoro --PRN Duonebs --Supplemental oxygen for goal SpO2 88-92%, reassess oxygen needs on discharge and again on outpatient follow-up   AKI with AGA Stage III CKD Lactic Acidosis - improved --HD per Nephrology, not sure whether he will have renal recovery   Atrial Fibrillation PVD Dilated cardiomyopathy Aortic stenosis HTN -Apixaban probably not the best anticoagulant for him given AKI   RLL Lung nodule, 28mm --will need follow up CT imaging in 12 months -Outpatient pulmonary appointment arranged.  PCCM will be available as needed  Kara Mead MD. FCCP. [facility]  If no response to pager , please call [phone]   12/29/2019

## 2019-12-29 NOTE — Plan of Care (Signed)
  Problem: Clinical Measurements: Goal: Respiratory complications will improve Outcome: Progressing   

## 2019-12-29 NOTE — Progress Notes (Signed)
ANTICOAGULATION CONSULT NOTE - Follow Up Consult  Pharmacy Consult for heparin Indication: atrial fibrillation  Labs: Recent Labs    12/27/19 0344 12/27/19 1628 12/27/19 1813 12/28/19 0337 12/28/19 1113 12/28/19 1358 12/29/19 0515  HGB 11.2*  --   --  10.3*  --   --  11.0*  HCT 32.6*  --   --  30.3*  --   --  32.8*  PLT 141*  --   --  119*  --   --  124*  APTT  --   --  >200*  --   --   --   --   LABPROT  --   --   --   --  13.3  --   --   INR  --   --   --   --  1.0  --   --   HEPARINUNFRC  --    < >  --  0.23*  --  <0.10* <0.10*  CREATININE 8.50*  --   --   --  6.85*  --  7.78*   < > = values in this interval not displayed.    Assessment: 69yo male subtherapeutic on heparin after resumed post-IR; no gtt issues or signs of bleeding per RN.  Goal of Therapy:  Heparin level 0.3-0.5 units/ml   Plan:  Will increase heparin gtt by 3 units/kg/hr to 1000 units/hr and check level in 8 hours.    Wynona Neat, PharmD, BCPS  12/29/2019,7:38 AM

## 2019-12-30 LAB — PROTIME-INR
INR: 1.1 (ref 0.8–1.2)
Prothrombin Time: 13.9 seconds (ref 11.4–15.2)

## 2019-12-30 LAB — RENAL FUNCTION PANEL
Albumin: 2.1 g/dL — ABNORMAL LOW (ref 3.5–5.0)
Anion gap: 12 (ref 5–15)
BUN: 48 mg/dL — ABNORMAL HIGH (ref 8–23)
CO2: 25 mmol/L (ref 22–32)
Calcium: 7.6 mg/dL — ABNORMAL LOW (ref 8.9–10.3)
Chloride: 101 mmol/L (ref 98–111)
Creatinine, Ser: 4.97 mg/dL — ABNORMAL HIGH (ref 0.61–1.24)
GFR calc Af Amer: 13 mL/min — ABNORMAL LOW (ref >60)
GFR calc non Af Amer: 11 mL/min — ABNORMAL LOW (ref >60)
Glucose, Bld: 117 mg/dL — ABNORMAL HIGH (ref 70–99)
Phosphorus: 3.4 mg/dL (ref 2.5–4.6)
Potassium: 3.6 mmol/L (ref 3.5–5.1)
Sodium: 138 mmol/L (ref 135–145)

## 2019-12-30 LAB — GLUCOSE, CAPILLARY
Glucose-Capillary: 92 mg/dL (ref 70–99)
Glucose-Capillary: 117 mg/dL — ABNORMAL HIGH (ref 70–99)
Glucose-Capillary: 240 mg/dL — ABNORMAL HIGH (ref 70–99)
Glucose-Capillary: 290 mg/dL — ABNORMAL HIGH (ref 70–99)

## 2019-12-30 LAB — CBC
HCT: 28.6 % — ABNORMAL LOW (ref 39.0–52.0)
Hemoglobin: 9.7 g/dL — ABNORMAL LOW (ref 13.0–17.0)
MCH: 29 pg (ref 26.0–34.0)
MCHC: 33.9 g/dL (ref 30.0–36.0)
MCV: 85.6 fL (ref 80.0–100.0)
Platelets: 132 10*3/uL — ABNORMAL LOW (ref 150–400)
RBC: 3.34 MIL/uL — ABNORMAL LOW (ref 4.22–5.81)
RDW: 14.1 % (ref 11.5–15.5)
WBC: 12.9 10*3/uL — ABNORMAL HIGH (ref 4.0–10.5)
nRBC: 0 % (ref 0.0–0.2)

## 2019-12-30 LAB — HEPARIN LEVEL (UNFRACTIONATED)
Heparin Unfractionated: 0.48 IU/mL (ref 0.30–0.70)
Heparin Unfractionated: 0.5 IU/mL (ref 0.30–0.70)
Heparin Unfractionated: 0.61 IU/mL (ref 0.30–0.70)

## 2019-12-30 MED ORDER — WARFARIN SODIUM 5 MG PO TABS
5.0000 mg | ORAL_TABLET | Freq: Once | ORAL | Status: AC
  Administered 2019-12-30: 5 mg via ORAL
  Filled 2019-12-30: qty 1

## 2019-12-30 MED ORDER — COUMADIN BOOK
Freq: Once | Status: DC
  Filled 2019-12-30: qty 1

## 2019-12-30 MED ORDER — WARFARIN VIDEO: Freq: Once | Status: AC

## 2019-12-30 MED ORDER — SORBITOL 70 % SOLN
30.0000 mL | Status: AC
  Administered 2019-12-30 (×2): 30 mL via ORAL
  Filled 2019-12-30: qty 30

## 2019-12-30 NOTE — Progress Notes (Addendum)
[place] for heparin and warfarin Indication: atrial fibrillation  No Known Allergies  Patient Measurements: Height: 5\' 11"  (180.3 cm) Weight: 156 lb 8.4 oz (71 kg) IBW/kg (Calculated) : 75.3  Heparin Dosing Weight: 70.6 kg  Vital Signs: Temp: 98.5 F (36.9 C) (03/06 0449) Temp Source: Oral (03/06 0449) BP: 132/75 (03/06 0449) Pulse Rate: 54 (03/06 0449)  Labs: Recent Labs    12/27/19 1628 12/27/19 1813 12/28/19 0337 12/28/19 0337 12/28/19 1113 12/28/19 1358 12/29/19 0515 12/29/19 2330 12/30/19 0417  HGB  --   --  10.3*   < >  --   --  11.0*  --  9.7*  HCT  --   --  30.3*  --   --   --  32.8*  --  28.6*  PLT  --   --  119*  --   --   --  124*  --  132*  APTT  --  >200*  --   --   --   --   --   --   --   LABPROT  --   --   --   --  13.3  --   --   --   --   INR  --   --   --   --  1.0  --   --   --   --   HEPARINUNFRC   < >  --  0.23*   < >  --  <0.10* <0.10* 0.61  --   CREATININE  --   --   --   --  6.85*  --  7.78*  --  4.97*   < > = values in this interval not displayed.    Estimated Creatinine Clearance: 14.5 mL/min (A) (by C-G formula based on SCr of 4.97 mg/dL (H)).  Assessment: 68 yr old male presented to N/V/D X 1 week, found with acute renal failure. Pt has hx of a fib, for which he was taking apixaban PTA (last dose on 2/27, due to concern for coffee ground emesis, which has resolved; pt was on SQ heparin). Pharmacy was consulted to dose heparin for a fib. Also consulted to begin warfarin.  Heparin level is therapeutic at 0.5 on 900 units/hr. INR is subtherapeutic at 1.1 as expected after first dose of warfarin. No overt bleeding noted, Hgb down to 9.7, platelets improved to 132 but remain low.  Goal of Therapy:  INR 2-3 Heparin level 0.3 - 0.5 units/ml Monitor platelets by anticoagulation protocol: Yes   Plan:  Continue heparin drip at 900 units/hr Confirmatory heparin level at 14:30 Warfarin 5 mg PO  tonight Daily heparin level, INR, CBC Monitor for s/sx of bleeding Warfarin book and video ordered   Thank you for involving pharmacy in this patient's care.  Renold Genta, PharmD, BCPS Clinical Pharmacist Clinical phone for 12/30/2019 until 3p is [phone] 12/30/2019 8:12 AM  **Pharmacist phone directory can be found on [email or web address] listed under [place]**  Addendum: Confirmatory heparin level is therapeutic at 0.48. No bleeding noted.  Continue heparin drip at 900 units/hr F/U am labs  [facility], PharmD, BCPS 3:09 PM

## 2019-12-30 NOTE — Progress Notes (Signed)
OT EVALUATION: PATIENT HAS DECREASED I AND SAFETY WITH ADLS AND MOBILITY. PATIENT TO BE FOLLOWED BY ACUTE OT AND RECOMMEND HHOT TO FOLLOW. PATIENT LIVES WITH WIFE WHO CAN ASSIST AS NEEDED. PATIENT NEEDS FURTHER EDUCATION ON ENERGY CONSERVATION AND SAFETY WITH ADLS AND MOBILITY.   12/30/19 1300  OT Visit Information  Last OT Received On 12/30/19  Assistance Needed +1  History of Present Illness 69 y.o. 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 Has COPD on 4L home   Precautions  Precautions Fall  Restrictions  Weight Bearing Restrictions No  Home Living  Family/patient expects to be discharged to: Private residence  Living Arrangements Spouse/significant other  Available Help at Discharge Family;Available 24 hours/day  Type of Home Apartment  Home Access Level entry  Home Layout One level  Bathroom Shower/Tub Tub/shower unit;Curtain  Automotive engineer None  Additional Comments 4 l of O2 at home.  Prior Function  Level of Independence Independent  Communication  Communication No difficulties  Pain Assessment  Pain Assessment No/denies pain  Cognition  Arousal/Alertness Awake/alert  Behavior During Therapy WFL for tasks assessed/performed  Overall Cognitive Status Within Functional Limits for tasks assessed  Upper Extremity Assessment  Upper Extremity Assessment Overall WFL for tasks assessed  Lower Extremity Assessment  Lower Extremity Assessment Defer to PT evaluation  Cervical / Trunk Assessment  Cervical / Trunk Assessment Normal  ADL  Overall ADL's  Needs assistance/impaired  Eating/Feeding Independent  Grooming Wash/dry hands;Wash/dry face;Standing;Min guard  Upper Body Bathing Set up;Supervision/ safety;Sitting  Lower Body Bathing Min guard;Sit to/from stand  Upper Body Dressing  Minimal assistance;Sitting   Lower Body Dressing Min guard;Sit to/from Environmental education officer guard;Ambulation  Functional mobility during ADLs Min guard  General ADL Comments Pt. is s to Min guard assist wtih ADLs.   Vision- History  Baseline Vision/History Wears glasses  Wears Glasses Reading only  Patient Visual Report No change from baseline  Vision- Assessment  Vision Assessment? No apparent visual deficits  Bed Mobility  Overal bed mobility Modified Independent  Transfers  Overall transfer level Needs assistance  Transfers Sit to/from Stand;Stand Pivot Transfers  Sit to Stand Min guard  Stand pivot transfers Min guard  General transfer comment Pt. with cues for proper hand placement.  Balance  Overall balance assessment Needs assistance  Standing balance-Leahy Scale Fair  OT - End of Session  Equipment Utilized During Treatment Rolling walker  Activity Tolerance Patient tolerated treatment well  Patient left in bed;with call bell/phone within reach;with bed alarm set  Nurse Communication  (ok therapy)  OT Assessment  OT Recommendation/Assessment Patient needs continued OT Services  OT Visit Diagnosis Unsteadiness on feet (R26.81)  OT Problem List Decreased activity tolerance;Decreased knowledge of use of DME or AE;Other (comment) (energy conservation)  Barriers to Discharge Comments Pt. has 24/7 assist  OT Plan  OT Frequency (ACUTE ONLY) Min 2X/week  OT Treatment/Interventions (ACUTE ONLY) Self-care/ADL training;Energy conservation;Therapeutic activities;DME and/or AE instruction;Patient/family education  AM-PAC OT "6 Clicks" Daily Activity Outcome Measure (Version 2)  Help from another person eating meals? 4  Help from another person taking care of personal grooming? 3  Help from another person toileting, which includes using toliet, bedpan, or urinal? 3  Help from another person bathing (including washing, rinsing, drying)? 3  Help from another person to put on  and taking off regular upper  body clothing? 3  Help from another person to put on and taking off regular lower body clothing? 3  6 Click Score 19  OT Recommendation  Follow Up Recommendations Home health OT  OT Equipment Tub/shower bench  Individuals Consulted  Consulted and Agree with Results and Recommendations Patient  Acute Rehab OT Goals  Patient Stated Goal go home  OT Goal Formulation With patient  Time For Goal Achievement 01/13/20  Potential to Achieve Goals Good  OT Time Calculation  OT Start Time (ACUTE ONLY) 1322  OT Stop Time (ACUTE ONLY) 1348  OT Time Calculation (min) 26 min  OT General Charges  $OT Visit 1 Visit  OT Evaluation  $OT Eval Low Complexity 1 Low  Written Expression  Dominant Hand Right  Reece Packer OT/L

## 2019-12-30 NOTE — Progress Notes (Addendum)
PROGRESS NOTE    Kyle [name]  POE:[id number] DOB: 1950/12/03 DOA: 12/24/2019 PCP: Alonna Buckler, MD    Brief Narrative:  69 year old man with a history of heavy smoking, COPD, peripheral vascular disease presenting with nausea, intermittent vomiting, abd cramping/pain and diarrhea for 1 week found to have acute renal failure, hyperkalemia and severe lactic acidosis of unclear etiology. Patient debated on 12/25/2019 and extubated on 12/26/2019.  Patient back on baseline O2 and following commands appropriately.   Assessment & Plan:   Principal Problem:   Acute renal failure (HCC) Active Problems:   Hypertension   Chronic systolic CHF (congestive heart failure) (HCC)   Dyslipidemia   Aortoiliac occlusive disease (HCC)   COPD with chronic bronchitis and emphysema (HCC)   Chronic respiratory failure with hypoxia (HCC)   Lactic acidosis   Uremia   High anion gap metabolic acidosis   Atrial fibrillation, chronic (HCC)   Diabetes mellitus type 2, insulin dependent (HCC)   Hyperkalemia, diminished renal excretion   Acute metabolic encephalopathy  1 acute metabolic encephalopathy Questionable etiology.  Likely secondary to seizure possibly in the setting of significant uremia, possible old left cortical infarct and acute renal failure.  Patient noted to have a elevated ammonia level of 56.  Patient due to encephalopathy required intubation and subsequently extubated on 12/26/2019.  Head CT done and MRI head negative for any acute abnormalities.  Stat EEG was done which was negative.  Post concern for seizures and patient seen in consultation by neurology.  Patient subsequently placed on Keppra twice daily.  Patient also underwent hemodialysis with nephrology.  Appreciate neurology and nephrology following.  Supportive care.  2.  Acute on chronic hypoxemic respiratory failure likely secondary to COPD exacerbation Clinically improved.  Patient extubated.  Patient back on home O2 at 5 L  nasal cannula.  Continue current regimen of Anoro Ellipta, Mucinex, duo nebs as needed.  IV Solu-Medrol has been transitioned to oral prednisone x5 days per pulmonary recommendations.  Will need to assess O2 requirements on discharge with goal on supplemental oxygen with sats of 88- 92%.  Outpatient follow-up with pulmonary.  3.  Coffee-ground emesis H&H stable.  Continue PPI.  4.  Acute renal failure on chronic kidney disease stage IIIa/hyperkalemia/metabolic acidosis Secondary to prerenal azotemia in the setting of volume depletion, GI losses from emesis and diarrhea in the setting of ACE inhibitor.  Baseline creatinine noted to be approximately 1.5.  Patient treated for hyperkalemia with resolution of hyperkalemia.  Patient underwent hemodialysis x2.  Nephrology following and per nephrology temporal cath not working well and IR has been requested to convert to a tunneled catheter which was done on 12/28/2019 without any complications.  Patient underwent hemodialysis yesterday without any complications.  Per nephrology no hemodialysis this weekend and if no signs of recovery by Monday will likely need to start clip process for acute kidney injury.  Per nephrology.   5.  Nausea/vomiting/diarrhea/abdominal pain Resolved.  CT abdomen and pelvis renal stone protocol with no source of patient's pain.  PCCM felt patient could possibly have had an ischemic colitis as patient with a history of peripheral artery disease however no acute findings noted.  Continue supportive care.  6.  Paroxysmal atrial fibrillation/dilated cardiomyopathy/aortic stenosis/peripheral vascular disease Patient noted to have had been lost to follow-up with cardiology in the past with some medication noncompliance.  Patient however insists has been compliant with his medications at home recently.  Patient noted to be on apixaban prior to admission.  Patient currently on IV heparin which we will monitor for now.  Patient was on apixaban  however due to renal function patient likely not a candidate for apixaban anymore.  Patient started on Coumadin for anticoagulation.  Coreg dose was decreased secondary to bradycardia.   7.  Hypertension Blood pressure stable on current regimen of Norvasc and Coreg.  Diuretics and ACE inhibitor on hold secondary to problem #4.  Follow.   8.  Right lower lobe lung nodule 5 mm Repeat follow-up CT imaging in 12 months.  Outpatient follow-up with pulmonary.  9.  Insulin-dependent diabetes mellitus Patient noted to be hypoglycemic on admission.  Hemoglobin A1c of 8.0.  CBG of 92 this morning.  Lantus 14 units daily.  Sliding scale insulin.  10.  Constipation Sorbitol 30 mg p.o. every 2 hours x2 doses.  If no results will need a enema.    DVT prophylaxis: Heparin Code Status: Full Family Communication: Updated patient.  Disposition Plan:  . Patient came from: Home.            . Anticipated d/c place: To be determined likely home. . Barriers to d/c OR conditions which need to be met to effect a safe d/c: Home when clinically improved, cleared by nephrology.   Consultants:   Interventional radiology: Dr. Earleen Newport  Neurology: Dr. Cheral Marker 12/24/2019  Nephrology: Dr. Jonnie Finner 12/24/2019  [facility] hospitalist: Dr. Lorin Mercy 12/24/2019  Procedures:   CT renal stone protocol 12/24/2019  Chest x-ray 12/24/2019, 12/25/2019  MRI brain MRA head 12/24/2019  Tunneled central venous HD catheter placement per IR 12/28/2019  Renal ultrasound 12/24/2019  EEG 12/24/2019--- suggestive of moderate to severe diffuse encephalopathy, nonspecific etiology.  Right IJ HD cath 12/24/2019  ETT 12/25/2019>>>> 12/26/2019  Antimicrobials:   None  Micro data COVID-19 PCR 12/24/2019 - negative   Subjective: Patient sitting up in bed watching television and eating his lunch.  Denies any chest pain and shortness of breath.  Denies any bleeding.  Complains of constipation  Objective: Vitals:   12/29/19 1951 12/30/19 0449  12/30/19 0500 12/30/19 0953  BP: 140/70 132/75  124/70  Pulse: 63 (!) 54  63  Resp: '20 18  18  '$ Temp: 97.9 F (36.6 C) 98.5 F (36.9 C)  (!) 97.5 F (36.4 C)  TempSrc: Oral Oral  Oral  SpO2: (!) 89% (!) 88%  (!) 81%  Weight:   71 kg   Height:        Intake/Output Summary (Last 24 hours) at 12/30/2019 1209 Last data filed at 12/30/2019 0900 Gross per 24 hour  Intake 963.55 ml  Output 2130 ml  Net -1166.45 ml   Filed Weights   12/29/19 1450 12/29/19 1900 12/30/19 0500  Weight: 73 kg 71 kg 71 kg    Examination:  General exam: NAD Respiratory system: Lungs clear to auscultation bilaterally.  No wheezes, no crackles, no rhonchi.  Normal respiratory effort. Cardiovascular system: Regular rate rhythm no murmurs rubs or gallops.  No JVD.  No lower extremity edema. Gastrointestinal system: Abdomen is soft, nontender, mildly distended, positive bowel sounds.  No rebound.  No guarding.  Central nervous system: Alert and oriented. No focal neurological deficits. Extremities: Symmetric 5 x 5 power. Skin: No rashes, lesions or ulcers Psychiatry: Judgement and insight appear normal. Mood & affect appropriate.     Data Reviewed: I have personally reviewed following labs and imaging studies  CBC: Recent Labs  Lab 12/26/19 0628 12/27/19 0344 12/28/19 0337 12/29/19 0515 12/30/19 0417  WBC 11.5* 10.9* 9.5  7.9 12.9*  HGB 12.3* 11.2* 10.3* 11.0* 9.7*  HCT 35.6* 32.6* 30.3* 32.8* 28.6*  MCV 85.4 84.2 86.6 86.8 85.6  PLT 208 141* 119* 124* 272*   Basic Metabolic Panel: Recent Labs  Lab 12/25/19 0425 12/25/19 1052 12/26/19 0629 12/26/19 0629 12/26/19 1700 12/27/19 0344 12/28/19 1113 12/29/19 0515 12/30/19 0417  NA 140  --  139   < > 136 136 137 136 138  K 4.2  4.2   < > 3.5   < > 3.0* 3.9 3.8 4.5 3.6  CL 95*  --  90*   < > 93* 96* 99 98 101  CO2 18*  --  28   < > '26 23 27 24 25  '$ GLUCOSE 105*  --  117*   < > 126* 155* 156* 236* 117*  BUN 90*  --  120*   < > 36* 84* 66* 84*  48*  CREATININE 8.87*  --  10.70*   < > 4.24* 8.50* 6.85* 7.78* 4.97*  CALCIUM 7.8*  --  7.7*   < > 8.4* 7.8* 7.6* 7.9* 7.6*  MG 2.0  --   --   --   --   --  1.9  --   --   PHOS 5.5*  5.4*  --  6.1*  --   --  6.8* 2.8  --  3.4   < > = values in this interval not displayed.   GFR: Estimated Creatinine Clearance: 14.5 mL/min (A) (by C-G formula based on SCr of 4.97 mg/dL (H)). Liver Function Tests: Recent Labs  Lab 12/24/19 0419 12/24/19 0419 12/25/19 0425 12/25/19 0425 12/26/19 5366 12/26/19 0629 12/27/19 0344 12/28/19 1113 12/30/19 0417  AST 36  --  37  --  31  --  25  --   --   ALT 28  --  27  --  30  --  26  --   --   ALKPHOS 65  --  53  --  55  --  51  --   --   BILITOT 2.5*  --  1.1  --  1.5*  --  1.5*  --   --   PROT 7.7  --  6.0*  --  5.5*  --  5.4*  --   --   ALBUMIN 3.0*   < > 2.5*  2.5*   < > 2.3* 2.3* 2.2*  2.2* 2.1* 2.1*   < > = values in this interval not displayed.   Recent Labs  Lab 12/24/19 0419  LIPASE 71*   Recent Labs  Lab 12/25/19 1052  AMMONIA 56*   Coagulation Profile: Recent Labs  Lab 12/28/19 1113 12/30/19 0817  INR 1.0 1.1   Cardiac Enzymes: Recent Labs  Lab 12/24/19 0419 12/25/19 1157  CKTOTAL 321 519*   BNP (last 3 results) No results for input(s): PROBNP in the last 8760 hours. HbA1C: No results for input(s): HGBA1C in the last 72 hours. CBG: Recent Labs  Lab 12/29/19 0647 12/29/19 1123 12/29/19 1954 12/30/19 0716 12/30/19 1121  GLUCAP 216* 205* 150* 92 117*   Lipid Profile: No results for input(s): CHOL, HDL, LDLCALC, TRIG, CHOLHDL, LDLDIRECT in the last 72 hours. Thyroid Function Tests: No results for input(s): TSH, T4TOTAL, FREET4, T3FREE, THYROIDAB in the last 72 hours. Anemia Panel: No results for input(s): VITAMINB12, FOLATE, FERRITIN, TIBC, IRON, RETICCTPCT in the last 72 hours. Sepsis Labs: Recent Labs  Lab 12/24/19 0600 12/24/19 0810 12/25/19 1630 12/25/19 1855  LATICACIDVEN 10.2*  9.3* 2.8* 2.8*     Recent Results (from the past 240 hour(s))  SARS CORONAVIRUS 2 (TAT 6-24 HRS) Nasopharyngeal Nasopharyngeal Swab     Status: None   Collection Time: 12/24/19  6:54 AM   Specimen: Nasopharyngeal Swab  Result Value Ref Range Status   SARS Coronavirus 2 NEGATIVE NEGATIVE Final    Comment: (NOTE) SARS-CoV-2 target nucleic acids are NOT DETECTED. The SARS-CoV-2 RNA is generally detectable in upper and lower respiratory specimens during the acute phase of infection. Negative results do not preclude SARS-CoV-2 infection, do not rule out co-infections with other pathogens, and should not be used as the sole basis for treatment or other patient management decisions. Negative results must be combined with clinical observations, patient history, and epidemiological information. The expected result is Negative. Fact Sheet for Patients: SugarRoll.be Fact Sheet for Healthcare Providers: https://www.woods-mathews.com/ This test is not yet approved or cleared by the [place] FDA and  has been authorized for detection and/or diagnosis of SARS-CoV-2 by FDA under an Emergency Use Authorization (EUA). This EUA will remain  in effect (meaning this test can be used) for the duration of the COVID-19 declaration under Section 56 4(b)(1) of the Act, 21 U.S.C. section 360bbb-3(b)(1), unless the authorization is terminated or revoked sooner. Performed at [facility], [place] [address], [name], [place] [address]   MRSA PCR Screening     Status: None   Collection Time: 12/24/19 11:54 AM   Specimen: Nasal Mucosa; Nasopharyngeal  Result Value Ref Range Status   MRSA by PCR NEGATIVE NEGATIVE Final    Comment:        The GeneXpert MRSA Assay (FDA approved for NASAL specimens only), is one component of a comprehensive MRSA colonization surveillance program. It is not intended to diagnose MRSA infection nor to guide or monitor treatment for MRSA  infections. Performed at [facility], [place] [address], [place], [place] [address]          Radiology Studies: IR Fluoro Guide CV Line Right  Result Date: 12/28/2019 INDICATION: 69 year old male with a history of renal failure EXAM: TUNNELED CENTRAL VENOUS HEMODIALYSIS CATHETER PLACEMENT WITH ULTRASOUND AND FLUOROSCOPIC GUIDANCE MEDICATIONS: 2 g Ancef. The antibiotic was given in an appropriate time interval prior to skin puncture. ANESTHESIA/SEDATION: Moderate (conscious) sedation was employed during this procedure. A total of Versed 1.0 mg and Fentanyl 50 mcg was administered intravenously. Moderate Sedation Time: 11 minutes. The patient's level of consciousness and vital signs were monitored continuously by radiology nursing throughout the procedure under my direct supervision. FLUOROSCOPY TIME:  Fluoroscopy Time: 0 minutes 12 seconds (1 mGy). COMPLICATIONS: None PROCEDURE: The procedure, risks, benefits, and alternatives were explained to the patient. Questions regarding the procedure were encouraged and answered. The patient understands and consents to the procedure. The right neck and chest was prepped with chlorhexidine, and draped in the usual sterile fashion using maximum barrier technique (cap and mask, sterile gown, sterile gloves, large sterile sheet, hand hygiene and cutaneous antiseptic). Antibiotic prophylaxis was initiated with 2.0g Ancef administered IV within 1 hour prior to skin incision. Local anesthesia was attained by infiltration with 1% lidocaine without epinephrine. Ultrasound demonstrated patency of the right internal jugular vein, and this was documented with an image. Under real-time ultrasound guidance, this vein was accessed with a 21 gauge micropuncture needle and image documentation was performed. A small dermatotomy was made at the access site with an 11 scalpel. A 0.018" wire was advanced into the SVC and the access  needle exchanged for a 28F micropuncture vascular  sheath. The 0.018" wire was then removed and a 0.035" wire advanced into the IVC. Upon withdrawal of the 018 wire, the wire was marked for appropriate length of the internal portion of the catheter. A 19 cm tip to cuff catheter was selected. The skin and subcutaneous tissues of the right chest wall were then generously infiltrated with 1% lidocaine without epinephrine from the chest wall to the puncture site at the right IJ. The hemodialysis catheter was then back tunneled from the right chest wall incision to the dermatotomy at the right neck. The venous access site was then serially dilated and a peel away vascular sheath placed over the wire. The wire was removed and the catheter was placed through the peel-away sheath. The catheter tip is positioned in the upper right atrium. This was documented with a spot image. Both ports of the hemodialysis catheter were then tested for excellent function. The ports were then locked with heparinized lock. The incision at the neck was then sealed with Dermabond. Patient tolerated the procedure well and remained hemodynamically stable throughout. No complications were encountered and no significant blood loss was encountered. IMPRESSION: Status post right IJ tunneled hemodialysis catheter. Catheter ready for use. Signed, Dulcy Fanny. Dellia Nims, RPVI [facility] Radiology Electronically Signed   By: Corrie Mckusick D.O.   On: 12/28/2019 16:22   IR US Guide Vasc Access Right  Result Date: 12/28/2019 INDICATION: 69 year old male with a history of renal failure EXAM: TUNNELED CENTRAL VENOUS HEMODIALYSIS CATHETER PLACEMENT WITH ULTRASOUND AND FLUOROSCOPIC GUIDANCE MEDICATIONS: 2 g Ancef. The antibiotic was given in an appropriate time interval prior to skin puncture. ANESTHESIA/SEDATION: Moderate (conscious) sedation was employed during this procedure. A total of Versed 1.0 mg and Fentanyl 50 mcg was administered intravenously.  Moderate Sedation Time: 11 minutes. The patient's level of consciousness and vital signs were monitored continuously by radiology nursing throughout the procedure under my direct supervision. FLUOROSCOPY TIME:  Fluoroscopy Time: 0 minutes 12 seconds (1 mGy). COMPLICATIONS: None PROCEDURE: The procedure, risks, benefits, and alternatives were explained to the patient. Questions regarding the procedure were encouraged and answered. The patient understands and consents to the procedure. The right neck and chest was prepped with chlorhexidine, and draped in the usual sterile fashion using maximum barrier technique (cap and mask, sterile gown, sterile gloves, large sterile sheet, hand hygiene and cutaneous antiseptic). Antibiotic prophylaxis was initiated with 2.0g Ancef administered IV within 1 hour prior to skin incision. Local anesthesia was attained by infiltration with 1% lidocaine without epinephrine. Ultrasound demonstrated patency of the right internal jugular vein, and this was documented with an image. Under real-time ultrasound guidance, this vein was accessed with a 21 gauge micropuncture needle and image documentation was performed. A small dermatotomy was made at the access site with an 11 scalpel. A 0.018" wire was advanced into the SVC and the access needle exchanged for a 28F micropuncture vascular sheath. The 0.018" wire was then removed and a 0.035" wire advanced into the IVC. Upon withdrawal of the 018 wire, the wire was marked for appropriate length of the internal portion of the catheter. A 19 cm tip to cuff catheter was selected. The skin and subcutaneous tissues of the right chest wall were then generously infiltrated with 1% lidocaine without epinephrine from the chest wall to the puncture site at the right IJ. The hemodialysis catheter was then back tunneled from the right chest wall incision to the dermatotomy  at the right neck. The venous access site was then serially dilated and a peel away  vascular sheath placed over the wire. The wire was removed and the catheter was placed through the peel-away sheath. The catheter tip is positioned in the upper right atrium. This was documented with a spot image. Both ports of the hemodialysis catheter were then tested for excellent function. The ports were then locked with heparinized lock. The incision at the neck was then sealed with Dermabond. Patient tolerated the procedure well and remained hemodynamically stable throughout. No complications were encountered and no significant blood loss was encountered. IMPRESSION: Status post right IJ tunneled hemodialysis catheter. Catheter ready for use. Signed, Dulcy Fanny. Dellia Nims, RPVI [facility] Radiology Electronically Signed   By: Corrie Mckusick D.O.   On: 12/28/2019 16:22        Scheduled Meds: . amLODipine  5 mg Oral Daily  . carvedilol  6.25 mg Oral BID WC  . Chlorhexidine Gluconate Cloth  6 each Topical Daily  . coumadin book   Does not apply Once  . feeding supplement (ENSURE ENLIVE)  237 mL Oral TID BM  . guaiFENesin  600 mg Oral BID  . insulin aspart  0-5 Units Subcutaneous QHS  . insulin aspart  0-9 Units Subcutaneous TID WC  . insulin glargine  14 Units Subcutaneous Daily  . levETIRAcetam  500 mg Oral BID  . predniSONE  40 mg Oral QAC breakfast  . sorbitol  30 mL Oral Q2H  . umeclidinium-vilanterol  1 puff Inhalation Daily  . warfarin  5 mg Oral ONCE-1800  . warfarin   Does not apply Once  . Warfarin - Pharmacist Dosing Inpatient   Does not apply q1800   Continuous Infusions: . sodium chloride 250 mL (12/29/19 0532)  . heparin 900 Units/hr (12/30/19 0033)     LOS: 6 days    Time spent: 40 minutes.    Irine Seal, MD [facility]   To contact the attending provider between 7A-7P or the covering provider during after hours 7P-7A, please log into the web site [email or web address] and access using universal [place]  password for that web site. If you do not have the password, please call the hospital operator.  12/30/2019, 12:09 PM

## 2019-12-30 NOTE — Evaluation (Signed)
Physical Therapy Evaluation Patient Details Name: Kyle [name] MRN: [id number] DOB: [birth year] Today's Date: 12/30/2019   History of Present Illness  69 y.o. 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 Has COPD on 4L home   Clinical Impression  Pt was seen for mobility with attempt to walk but due to his O2 sats in standing could not get this done.  Was on 2L O2 in room when PT arrived and may have been too lengthy a time before PT arrived.  Sat was 77% at bed level and with sats up to 88% at best once increased could only to sit to stand with effort.  Pt will be seen again to work on gait as his sats tolerate, which should look better next visit. Follow acutely for LE strength, gait training and recovery of independence to assist his return home.    Follow Up Recommendations Home health PT;Supervision for mobility/OOB    Equipment Recommendations  Rolling walker with 5" wheels    Recommendations for Other Services       Precautions / Restrictions Precautions Precautions: Fall Precaution Comments: monitor for impulsivity Restrictions Weight Bearing Restrictions: No      Mobility  Bed Mobility Overal bed mobility: Modified Independent             General bed mobility comments: monitor for rapid transition to sit  Transfers Overall transfer level: Needs assistance Equipment used: Rolling walker (2 wheeled);1 person hand held assist Transfers: Sit to/from [facility] Sit to Stand: Min assist Stand pivot transfers: Min assist       General transfer comment: Pt. with cues for proper hand placement.  Ambulation/Gait             General Gait Details: deferred as O2 sats were low with standing down to 87%, had to increase O2 and was 88% with O2 added but fluctuating  Stairs            Wheelchair Mobility     Modified Rankin (Stroke Patients Only)       Balance Overall balance assessment: Needs assistance           Standing balance-Leahy Scale: Fair                               Pertinent Vitals/Pain Pain Assessment: No/denies pain    Home Living Family/patient expects to be discharged to:: Private residence Living Arrangements: Spouse/significant other Available Help at Discharge: Family;Available 24 hours/day Type of Home: Apartment Home Access: Level entry     Home Layout: One level Home Equipment: None Additional Comments: on O2 at home, 5L in chart    Prior Function Level of Independence: Independent               Hand Dominance   Dominant Hand: Right    Extremity/Trunk Assessment   Upper Extremity Assessment Upper Extremity Assessment: Defer to OT evaluation    Lower Extremity Assessment Lower Extremity Assessment: Generalized weakness    Cervical / Trunk Assessment Cervical / Trunk Assessment: Normal  Communication   Communication: No difficulties  Cognition Arousal/Alertness: Awake/alert Behavior During Therapy: WFL for tasks assessed/performed Overall Cognitive Status: Within Functional Limits for tasks assessed  General Comments General comments (skin integrity, edema, etc.): Pt was desaturating today but was down to 2L O2 on the wall, could not get him above 88% to attempt a walk with 4LO2    Exercises     Assessment/Plan    PT Assessment Patient needs continued PT services  PT Problem List Decreased strength;Decreased range of motion;Decreased activity tolerance;Decreased balance;Decreased mobility;Decreased coordination;Decreased safety awareness;Cardiopulmonary status limiting activity       PT Treatment Interventions DME instruction;Gait training;Functional mobility training;Therapeutic activities;Therapeutic exercise;Balance training;Neuromuscular  re-education;Patient/family education    PT Goals (Current goals can be found in the Care Plan section)  Acute Rehab PT Goals Patient Stated Goal: go home PT Goal Formulation: With patient Time For Goal Achievement: 01/13/20 Potential to Achieve Goals: Good    Frequency Min 3X/week   Barriers to discharge   has his wife with him at home    Co-evaluation               AM-PAC PT "6 Clicks" Mobility  Outcome Measure Help needed turning from your back to your side while in a flat bed without using bedrails?: None Help needed moving from lying on your back to sitting on the side of a flat bed without using bedrails?: A Little Help needed moving to and from a bed to a chair (including a wheelchair)?: A Little Help needed standing up from a chair using your arms (e.g., wheelchair or bedside chair)?: A Little Help needed to walk in hospital room?: A Little Help needed climbing 3-5 steps with a railing? : A Lot 6 Click Score: 18    End of Session Equipment Utilized During Treatment: Gait belt Activity Tolerance: Patient limited by fatigue;Treatment limited secondary to medical complications (Comment) Patient left: in bed;with call bell/phone within reach;with bed alarm set Nurse Communication: Mobility status PT Visit Diagnosis: Unsteadiness on feet (R26.81);Muscle weakness (generalized) (M62.81);Difficulty in walking, not elsewhere classified (R26.2)    Time: 1448-1856 PT Time Calculation (min) (ACUTE ONLY): 39 min   Charges:   PT Evaluation $PT Eval Moderate Complexity: 1 Mod PT Treatments $Therapeutic Exercise: 8-22 mins $Therapeutic Activity: 8-22 mins       Ramond Dial 12/30/2019, 2:45 PM  Mee Hives, PT MS Acute Rehab Dept. Number: [place] [facility] [place]

## 2019-12-30 NOTE — Progress Notes (Signed)
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 Has COPD on 4L home O2. In ED labs showed creat 14, K 6.3, on lasix and ace inhibitor at home. Pt admitted to [facility]. Asked to see for AKI; cr 1.47 on 2/4/21w/ BLcr from Jun 2020-feb 2021 1.4- 1.7.   Assessment/ Plan:   1. AoCKD 3 - in setting of vol depletion/ vomiting/ diarrhea and ACEi. B/l creat is 1.5.Txfor hyperkalemia and po K+ lowering meds; K better now.  - very poor treatment on Wed bec of prior temp cath.  - Appreciate Dr. Earleen Newport (VIR) placing RIJ TC; pt not complaining of pain. No e/o recovery.   - Last  HD Fri with 2L net UF tolerated; will give weekend off. If no signs of recovery by Monday will start CLIP process for AKI + MWF regimen for RRT.  2. DM on insulin 3. Vol depletion - treated 4. Lactic / metabolic acidosis - severebut now resolved.  5. COPD - on homeO2.  Subjective:   Awake and interactive, pleasant. No events overnight Not c/o pain from TC placement   Objective:   BP 124/70 (BP Location: Left Arm)   Pulse 63   Temp (!) 97.5 F (36.4 C) (Oral)   Resp 18   Ht 5\' 11"  (1.803 m)   Wt 71 kg   SpO2 (!) 81%   BMI 21.83 kg/m   Intake/Output Summary (Last 24 hours) at 12/30/2019 1004 Last data filed at 12/30/2019 0900 Gross per 24 hour  Intake 963.55 ml  Output 2130 ml  Net -1166.45 ml   Weight change: 0.6 kg  Physical Exam: Genin bedconversesnow, very pleasant No rash, cyanosis or gangrene No jvd or bruits Chest clear bilatto bases, no rales, +rhonchi RRR no MRG Abd soft ntnd no massor ascites ExttrLEedema, no wounds or ulcers Access: RIJtemp GU: foley to gravity  Imaging: IR Fluoro Guide CV Line Right  Result Date: 12/28/2019 INDICATION: 68 year old male with a history of renal failure  EXAM: TUNNELED CENTRAL VENOUS HEMODIALYSIS CATHETER PLACEMENT WITH ULTRASOUND AND FLUOROSCOPIC GUIDANCE MEDICATIONS: 2 g Ancef. The antibiotic was given in an appropriate time interval prior to skin puncture. ANESTHESIA/SEDATION: Moderate (conscious) sedation was employed during this procedure. A total of Versed 1.0 mg and Fentanyl 50 mcg was administered intravenously. Moderate Sedation Time: 11 minutes. The patient's level of consciousness and vital signs were monitored continuously by radiology nursing throughout the procedure under my direct supervision. FLUOROSCOPY TIME:  Fluoroscopy Time: 0 minutes 12 seconds (1 mGy). COMPLICATIONS: None PROCEDURE: The procedure, risks, benefits, and alternatives were explained to the patient. Questions regarding the procedure were encouraged and answered. The patient understands and consents to the procedure. The right neck and chest was prepped with chlorhexidine, and draped in the usual sterile fashion using maximum barrier technique (cap and mask, sterile gown, sterile gloves, large sterile sheet, hand hygiene and cutaneous antiseptic). Antibiotic prophylaxis was initiated with 2.0g Ancef administered IV within 1 hour prior to skin incision. Local anesthesia was attained by infiltration with 1% lidocaine without epinephrine. Ultrasound demonstrated patency of the right internal jugular vein, and this was documented with an image. Under real-time ultrasound guidance, this vein was accessed with a 21 gauge micropuncture needle and image documentation was performed. A small dermatotomy was made at the access site with an 11 scalpel. A  0.018" wire was advanced into the SVC and the access needle exchanged for a 89F micropuncture vascular sheath. The 0.018" wire was then removed and a 0.035" wire advanced into the IVC. Upon withdrawal of the 018 wire, the wire was marked for appropriate length of the internal portion of the catheter. A 19 cm tip to cuff catheter was selected. The  skin and subcutaneous tissues of the right chest wall were then generously infiltrated with 1% lidocaine without epinephrine from the chest wall to the puncture site at the right IJ. The hemodialysis catheter was then back tunneled from the right chest wall incision to the dermatotomy at the right neck. The venous access site was then serially dilated and a peel away vascular sheath placed over the wire. The wire was removed and the catheter was placed through the peel-away sheath. The catheter tip is positioned in the upper right atrium. This was documented with a spot image. Both ports of the hemodialysis catheter were then tested for excellent function. The ports were then locked with heparinized lock. The incision at the neck was then sealed with Dermabond. Patient tolerated the procedure well and remained hemodynamically stable throughout. No complications were encountered and no significant blood loss was encountered. IMPRESSION: Status post right IJ tunneled hemodialysis catheter. Catheter ready for use. Signed, Dulcy Fanny. Dellia Nims, RPVI [facility] Radiology Electronically Signed   By: Corrie Mckusick D.O.   On: 12/28/2019 16:22   IR US Guide Vasc Access Right  Result Date: 12/28/2019 INDICATION: 69 year old male with a history of renal failure EXAM: TUNNELED CENTRAL VENOUS HEMODIALYSIS CATHETER PLACEMENT WITH ULTRASOUND AND FLUOROSCOPIC GUIDANCE MEDICATIONS: 2 g Ancef. The antibiotic was given in an appropriate time interval prior to skin puncture. ANESTHESIA/SEDATION: Moderate (conscious) sedation was employed during this procedure. A total of Versed 1.0 mg and Fentanyl 50 mcg was administered intravenously. Moderate Sedation Time: 11 minutes. The patient's level of consciousness and vital signs were monitored continuously by radiology nursing throughout the procedure under my direct supervision. FLUOROSCOPY TIME:  Fluoroscopy Time: 0 minutes 12 seconds (1  mGy). COMPLICATIONS: None PROCEDURE: The procedure, risks, benefits, and alternatives were explained to the patient. Questions regarding the procedure were encouraged and answered. The patient understands and consents to the procedure. The right neck and chest was prepped with chlorhexidine, and draped in the usual sterile fashion using maximum barrier technique (cap and mask, sterile gown, sterile gloves, large sterile sheet, hand hygiene and cutaneous antiseptic). Antibiotic prophylaxis was initiated with 2.0g Ancef administered IV within 1 hour prior to skin incision. Local anesthesia was attained by infiltration with 1% lidocaine without epinephrine. Ultrasound demonstrated patency of the right internal jugular vein, and this was documented with an image. Under real-time ultrasound guidance, this vein was accessed with a 21 gauge micropuncture needle and image documentation was performed. A small dermatotomy was made at the access site with an 11 scalpel. A 0.018" wire was advanced into the SVC and the access needle exchanged for a 89F micropuncture vascular sheath. The 0.018" wire was then removed and a 0.035" wire advanced into the IVC. Upon withdrawal of the 018 wire, the wire was marked for appropriate length of the internal portion of the catheter. A 19 cm tip to cuff catheter was selected. The skin and subcutaneous tissues of the right chest wall were then generously infiltrated with 1% lidocaine without epinephrine from the chest wall to the puncture site at the right IJ. The hemodialysis catheter was then back  tunneled from the right chest wall incision to the dermatotomy at the right neck. The venous access site was then serially dilated and a peel away vascular sheath placed over the wire. The wire was removed and the catheter was placed through the peel-away sheath. The catheter tip is positioned in the upper right atrium. This was documented with a spot image. Both ports of the hemodialysis catheter  were then tested for excellent function. The ports were then locked with heparinized lock. The incision at the neck was then sealed with Dermabond. Patient tolerated the procedure well and remained hemodynamically stable throughout. No complications were encountered and no significant blood loss was encountered. IMPRESSION: Status post right IJ tunneled hemodialysis catheter. Catheter ready for use. Signed, Dulcy Fanny. Dellia Nims, RPVI [facility] Radiology Electronically Signed   By: Corrie Mckusick D.O.   On: 12/28/2019 16:22    Labs: BMET Recent Labs  Lab 12/25/19 0425 12/25/19 1052 12/25/19 2124 12/26/19 0629 12/26/19 1700 12/27/19 0344 12/28/19 1113 12/29/19 0515 12/30/19 0417  NA 140  --   --  139 136 136 137 136 138  K 4.2  4.2   < > 4.1 3.5 3.0* 3.9 3.8 4.5 3.6  CL 95*  --   --  90* 93* 96* 99 98 101  CO2 18*  --   --  28 26 23 27 24 25   GLUCOSE 105*  --   --  117* 126* 155* 156* 236* 117*  BUN 90*  --   --  120* 36* 84* 66* 84* 48*  CREATININE 8.87*  --   --  10.70* 4.24* 8.50* 6.85* 7.78* 4.97*  CALCIUM 7.8*  --   --  7.7* 8.4* 7.8* 7.6* 7.9* 7.6*  PHOS 5.5*  5.4*  --   --  6.1*  --  6.8* 2.8  --  3.4   < > = values in this interval not displayed.   CBC Recent Labs  Lab 12/27/19 0344 12/28/19 0337 12/29/19 0515 12/30/19 0417  WBC 10.9* 9.5 7.9 12.9*  HGB 11.2* 10.3* 11.0* 9.7*  HCT 32.6* 30.3* 32.8* 28.6*  MCV 84.2 86.6 86.8 85.6  PLT 141* 119* 124* 132*    Medications:    . amLODipine  5 mg Oral Daily  . carvedilol  6.25 mg Oral BID WC  . Chlorhexidine Gluconate Cloth  6 each Topical Daily  . coumadin book   Does not apply Once  . feeding supplement (ENSURE ENLIVE)  237 mL Oral TID BM  . guaiFENesin  600 mg Oral BID  . insulin aspart  0-5 Units Subcutaneous QHS  . insulin aspart  0-9 Units Subcutaneous TID WC  . insulin glargine  14 Units Subcutaneous Daily  . levETIRAcetam  500 mg Oral BID  . pantoprazole   40 mg Oral Daily  . predniSONE  40 mg Oral QAC breakfast  . umeclidinium-vilanterol  1 puff Inhalation Daily  . warfarin  5 mg Oral ONCE-1800  . warfarin   Does not apply Once  . Warfarin - Pharmacist Dosing Inpatient   Does not apply q1800      Otelia Santee, MD 12/30/2019, 10:04 AM

## 2019-12-30 NOTE — Progress Notes (Signed)
ANTICOAGULATION CONSULT NOTE - Follow Up Consult  Pharmacy Consult for heparin Indication: atrial fibrillation  Labs: Recent Labs    12/27/19 0344 12/27/19 1628 12/27/19 1813 12/28/19 0337 12/28/19 0337 12/28/19 1113 12/28/19 1358 12/29/19 0515 12/29/19 2330  HGB 11.2*  --   --  10.3*  --   --   --  11.0*  --   HCT 32.6*  --   --  30.3*  --   --   --  32.8*  --   PLT 141*  --   --  119*  --   --   --  124*  --   APTT  --   --  >200*  --   --   --   --   --   --   LABPROT  --   --   --   --   --  13.3  --   --   --   INR  --   --   --   --   --  1.0  --   --   --   HEPARINUNFRC  --    < >  --  0.23*   < >  --  <0.10* <0.10* 0.61  CREATININE 8.50*  --   --   --   --  6.85*  --  7.78*  --    < > = values in this interval not displayed.    Assessment: 69yo male supratherapeutic on heparin after rate change, no heparin appears to have been given in HD; no gtt issues or signs of bleeding per RN.  Goal of Therapy:  Heparin level 0.3-0.5 units/ml   Plan:  Will decrease heparin gtt by ~1-2 units/kg/hr to 900 units/hr and check level in 8 hours.    Wynona Neat, PharmD, BCPS  12/30/2019,12:21 AM

## 2019-12-31 ENCOUNTER — Ambulatory Visit: Payer: Medicare HMO

## 2019-12-31 LAB — RENAL FUNCTION PANEL
Albumin: 2.2 g/dL — ABNORMAL LOW (ref 3.5–5.0)
Anion gap: 14 (ref 5–15)
BUN: 74 mg/dL — ABNORMAL HIGH (ref 8–23)
CO2: 22 mmol/L (ref 22–32)
Calcium: 7.8 mg/dL — ABNORMAL LOW (ref 8.9–10.3)
Chloride: 100 mmol/L (ref 98–111)
Creatinine, Ser: 6.63 mg/dL — ABNORMAL HIGH (ref 0.61–1.24)
GFR calc Af Amer: 9 mL/min — ABNORMAL LOW (ref >60)
GFR calc non Af Amer: 8 mL/min — ABNORMAL LOW (ref >60)
Glucose, Bld: 101 mg/dL — ABNORMAL HIGH (ref 70–99)
Phosphorus: 4.1 mg/dL (ref 2.5–4.6)
Potassium: 3.5 mmol/L (ref 3.5–5.1)
Sodium: 136 mmol/L (ref 135–145)

## 2019-12-31 LAB — CBC
HCT: 27 % — ABNORMAL LOW (ref 39.0–52.0)
Hemoglobin: 9.3 g/dL — ABNORMAL LOW (ref 13.0–17.0)
MCH: 29.3 pg (ref 26.0–34.0)
MCHC: 34.4 g/dL (ref 30.0–36.0)
MCV: 85.2 fL (ref 80.0–100.0)
Platelets: 168 10*3/uL (ref 150–400)
RBC: 3.17 MIL/uL — ABNORMAL LOW (ref 4.22–5.81)
RDW: 14.2 % (ref 11.5–15.5)
WBC: 12.7 10*3/uL — ABNORMAL HIGH (ref 4.0–10.5)
nRBC: 0 % (ref 0.0–0.2)

## 2019-12-31 LAB — PROTIME-INR
INR: 2.2 — ABNORMAL HIGH (ref 0.8–1.2)
INR: 2.5 — ABNORMAL HIGH (ref 0.8–1.2)
Prothrombin Time: 24.4 seconds — ABNORMAL HIGH (ref 11.4–15.2)
Prothrombin Time: 26.5 seconds — ABNORMAL HIGH (ref 11.4–15.2)

## 2019-12-31 LAB — GLUCOSE, CAPILLARY
Glucose-Capillary: 84 mg/dL (ref 70–99)
Glucose-Capillary: 143 mg/dL — ABNORMAL HIGH (ref 70–99)
Glucose-Capillary: 358 mg/dL — ABNORMAL HIGH (ref 70–99)
Glucose-Capillary: 257 mg/dL — ABNORMAL HIGH (ref 70–99)

## 2019-12-31 LAB — HEPARIN LEVEL (UNFRACTIONATED): Heparin Unfractionated: 0.36 IU/mL (ref 0.30–0.70)

## 2019-12-31 MED ORDER — SENNOSIDES-DOCUSATE SODIUM 8.6-50 MG PO TABS
1.0000 | ORAL_TABLET | Freq: Two times a day (BID) | ORAL | Status: DC
  Administered 2019-12-31 – 2020-01-11 (×23): 1 via ORAL
  Filled 2019-12-31 (×24): qty 1

## 2019-12-31 MED ORDER — INSULIN ASPART 100 UNIT/ML ~~LOC~~ SOLN
4.0000 [IU] | Freq: Three times a day (TID) | SUBCUTANEOUS | Status: DC
  Administered 2019-12-31 – 2020-01-05 (×11): 4 [IU] via SUBCUTANEOUS

## 2019-12-31 NOTE — Progress Notes (Signed)
PROGRESS NOTE    Kyle [name]  PZW:[id number] DOB: 09-06-1951 DOA: 12/24/2019 PCP: Alonna Buckler, MD    Brief Narrative:  69 year old man with a history of heavy smoking, COPD, peripheral vascular disease presenting with nausea, intermittent vomiting, abd cramping/pain and diarrhea for 1 week found to have acute renal failure, hyperkalemia and severe lactic acidosis of unclear etiology. Patient debated on 12/25/2019 and extubated on 12/26/2019.  Patient back on baseline O2 and following commands appropriately.   Assessment & Plan:   Principal Problem:   Acute renal failure (HCC) Active Problems:   Hypertension   Chronic systolic CHF (congestive heart failure) (HCC)   Dyslipidemia   Aortoiliac occlusive disease (HCC)   COPD with chronic bronchitis and emphysema (HCC)   Chronic respiratory failure with hypoxia (HCC)   Lactic acidosis   Uremia   High anion gap metabolic acidosis   Atrial fibrillation, chronic (HCC)   Diabetes mellitus type 2, insulin dependent (HCC)   Hyperkalemia, diminished renal excretion   Acute metabolic encephalopathy  1 acute metabolic encephalopathy Questionable etiology.  Likely secondary to seizure possibly in the setting of significant uremia, possible old left cortical infarct and acute renal failure.  Patient noted to have a elevated ammonia level of 56.  Patient due to encephalopathy required intubation and subsequently extubated on 12/26/2019.  Head CT done and MRI head negative for any acute abnormalities.  Stat EEG was done which was negative.  Post concern for seizures and patient seen in consultation by neurology.  Patient subsequently placed on Keppra twice daily.  Patient also underwent hemodialysis with nephrology.  Appreciate neurology and nephrology following.  Supportive care.  2.  Acute on chronic hypoxemic respiratory failure likely secondary to COPD exacerbation Clinically improved.  Patient extubated.  Patient back on home O2 at 5 L  nasal cannula.  Continue current regimen of Anoro Ellipta, Mucinex, duo nebs as needed.  IV Solu-Medrol has been transitioned to oral prednisone x5 days per pulmonary recommendations.  Will need to assess O2 requirements on discharge with goal on supplemental oxygen with sats of 88- 92%.  Outpatient follow-up with pulmonary.  3.  Coffee-ground emesis H&H stable.  Continue PPI.  4.  Acute renal failure on chronic kidney disease stage IIIa/hyperkalemia/metabolic acidosis versus progressive chronic kidney disease Secondary to prerenal azotemia in the setting of volume depletion, GI losses from emesis and diarrhea in the setting of ACE inhibitor.  Baseline creatinine noted to be approximately 1.5.  Patient treated for hyperkalemia with resolution of hyperkalemia.  Patient underwent hemodialysis x3.  Nephrology following and per nephrology temporal cath not working well and IR has been requested to convert to a tunneled catheter which was done on 12/28/2019 without any complications.  Per nephrology no hemodialysis this weekend and if no signs of recovery by Monday will likely need to start CLIP process for acute kidney injury.  Per nephrology.   5.  Nausea/vomiting/diarrhea/abdominal pain Resolved.  CT abdomen and pelvis renal stone protocol with no source of patient's pain.  PCCM felt patient could possibly have had an ischemic colitis as patient with a history of peripheral artery disease however no acute findings noted.  Continue supportive care.  6.  Paroxysmal atrial fibrillation/dilated cardiomyopathy/aortic stenosis/peripheral vascular disease Patient noted to have had been lost to follow-up with cardiology in the past with some medication noncompliance.  Patient however insists has been compliant with his medications at home recently.  Patient noted to be on apixaban prior to admission.  Patient currently  on IV heparin which we will monitor for now.  Patient was on apixaban however due to renal  function patient likely not a candidate for apixaban anymore.  Patient started on Coumadin for anticoagulation.  Currently therapeutic.  Discontinue heparin.  Coreg dose was decreased secondary to bradycardia.   7.  Hypertension Blood pressure stable on current regimen of Norvasc and Coreg.  Diuretics and ACE inhibitor on hold secondary to problem #4.  Follow.   8.  Right lower lobe lung nodule 5 mm Repeat follow-up CT imaging in 12 months.  Outpatient follow-up with pulmonary.  9.  Insulin-dependent diabetes mellitus Patient noted to be hypoglycemic on admission.  Hemoglobin A1c of 8.0.  CBG of 84 this morning.  Continue Lantus 14 units daily.  Sliding scale insulin.  NovoLog 4 units 3 times daily with meals.  10.  Constipation Resolved with sorbitol 30 mg p.o. every 2 hours x2 doses.  Continue Senokot-S twice daily.     DVT prophylaxis: Heparin Code Status: Full Family Communication: Updated patient.  Disposition Plan:  . Patient came from: Home.            . Anticipated d/c place: To be determined likely home. . Barriers to d/c OR conditions which need to be met to effect a safe d/c: Home when clinically improved, cleared by nephrology.  If no significant improvement with renal function could likely begin CLIP process Monday, 01/01/2020.   Consultants:   Interventional radiology: Dr. Earleen Newport  Neurology: Dr. Cheral Marker 12/24/2019  Nephrology: Dr. Jonnie Finner 12/24/2019  [facility] hospitalist: Dr. Lorin Mercy 12/24/2019  Procedures:   CT renal stone protocol 12/24/2019  Chest x-ray 12/24/2019, 12/25/2019  MRI brain MRA head 12/24/2019  Tunneled central venous HD catheter placement per IR 12/28/2019  Renal ultrasound 12/24/2019  EEG 12/24/2019--- suggestive of moderate to severe diffuse encephalopathy, nonspecific etiology.  Right IJ HD cath 12/24/2019  ETT 12/25/2019>>>> 12/26/2019  Antimicrobials:   None  Micro data COVID-19 PCR 12/24/2019 - negative   Subjective: Patient laying in bed  watching television.  Stated had a large bowel movement yesterday and no longer feels as constipated.  No chest pain.  No shortness of breath.  No bleeding.    Objective: Vitals:   12/31/19 0500 12/31/19 0505 12/31/19 0842 12/31/19 0937  BP:  133/66  128/68  Pulse:  68  71  Resp:  20  18  Temp:  98 F (36.7 C)  98.2 F (36.8 C)  TempSrc:  Oral  Oral  SpO2:  92% 90% 95%  Weight: 74.1 kg     Height:        Intake/Output Summary (Last 24 hours) at 12/31/2019 1610 Last data filed at 12/31/2019 1300 Gross per 24 hour  Intake 1548 ml  Output 103 ml  Net 1445 ml   Filed Weights   12/30/19 0500 12/30/19 2107 12/31/19 0500  Weight: 71 kg 74.1 kg 74.1 kg    Examination:  General exam: NAD Respiratory system:CTAB.  No wheezes, no crackles, no rhonchi.  Normal respiratory effort.  Cardiovascular system: RRR no murmurs rubs or gallops.  No JVD.  No lower extremity edema. Gastrointestinal system: Abdomen is nontender, soft, mildly distended, positive bowel sounds.  No rebound.  No guarding. Central nervous system: Alert and oriented. No focal neurological deficits. Extremities: Symmetric 5 x 5 power. Skin: No rashes, lesions or ulcers Psychiatry: Judgement and insight appear normal. Mood & affect appropriate.     Data Reviewed: I have personally reviewed following labs and imaging studies  CBC: Recent Labs  Lab 12/27/19 0344 12/28/19 0337 12/29/19 0515 12/30/19 0417 12/31/19 0600  WBC 10.9* 9.5 7.9 12.9* 12.7*  HGB 11.2* 10.3* 11.0* 9.7* 9.3*  HCT 32.6* 30.3* 32.8* 28.6* 27.0*  MCV 84.2 86.6 86.8 85.6 85.2  PLT 141* 119* 124* 132* 382   Basic Metabolic Panel: Recent Labs  Lab 12/25/19 0425 12/25/19 1052 12/26/19 0629 12/26/19 1700 12/27/19 0344 12/28/19 1113 12/29/19 0515 12/30/19 0417 12/31/19 0600  NA 140  --  139   < > 136 137 136 138 136  K 4.2  4.2   < > 3.5   < > 3.9 3.8 4.5 3.6 3.5  CL 95*  --  90*   < > 96* 99 98 101 100  CO2 18*  --  28   < > _0 GLUCOSE 105*  --  117*   < > 155* 156* 236* 117* 101*  BUN 90*  --  120*   < > 84* 66* 84* 48* 74*  CREATININE 8.87*  --  10.70*   < > 8.50* 6.85* 7.78* 4.97* 6.63*  CALCIUM 7.8*  --  7.7*   < > 7.8* 7.6* 7.9* 7.6* 7.8*  MG 2.0  --   --   --   --  1.9  --   --   --   PHOS 5.5*  5.4*  --  6.1*  --  6.8* 2.8  --  3.4 4.1   < > = values in this interval not displayed.   GFR: Estimated Creatinine Clearance: 11.3 mL/min (A) (by C-G formula based on SCr of 6.63 mg/dL (H)). Liver Function Tests: Recent Labs  Lab 12/25/19 0425 12/25/19 0425 12/26/19 5053 12/26/19 9767 12/26/19 0629 12/27/19 0344 12/28/19 1113 12/30/19 0417 12/31/19 0600  AST 37  --  31  --   --  25  --   --   --   ALT 27  --  30  --   --  26  --   --   --   ALKPHOS 53  --  55  --   --  51  --   --   --   BILITOT 1.1  --  1.5*  --   --  1.5*  --   --   --   PROT 6.0*  --  5.5*  --   --  5.4*  --   --   --   ALBUMIN 2.5*  2.5*   < > 2.3*   < > 2.3* 2.2*  2.2* 2.1* 2.1* 2.2*   < > = values in this interval not displayed.   No results for input(s): LIPASE, AMYLASE in the last 168 hours. Recent Labs  Lab 12/25/19 1052  AMMONIA 56*   Coagulation Profile: Recent Labs  Lab 12/28/19 1113 12/30/19 0817 12/31/19 0600 12/31/19 0935  INR 1.0 1.1 2.2* 2.5*   Cardiac Enzymes: Recent Labs  Lab 12/25/19 1157  CKTOTAL 519*   BNP (last 3 results) No results for input(s): PROBNP in the last 8760 hours. HbA1C: No results for input(s): HGBA1C in the last 72 hours. CBG: Recent Labs  Lab 12/30/19 1630 12/30/19 2107 12/31/19 0709 12/31/19 1128 12/31/19 1607  GLUCAP 240* 290* 84 143* 358*   Lipid Profile: No results for input(s): CHOL, HDL, LDLCALC, TRIG, CHOLHDL, LDLDIRECT in the last 72 hours. Thyroid Function Tests: No results for input(s): TSH, T4TOTAL, FREET4, T3FREE, THYROIDAB in the last 72 hours. Anemia Panel: No  results for input(s): VITAMINB12, FOLATE, FERRITIN, TIBC, IRON, RETICCTPCT in the  last 72 hours. Sepsis Labs: Recent Labs  Lab 12/25/19 1630 12/25/19 1855  LATICACIDVEN 2.8* 2.8*    Recent Results (from the past 240 hour(s))  SARS CORONAVIRUS 2 (TAT 6-24 HRS) Nasopharyngeal Nasopharyngeal Swab     Status: None   Collection Time: 12/24/19  6:54 AM   Specimen: Nasopharyngeal Swab  Result Value Ref Range Status   SARS Coronavirus 2 NEGATIVE NEGATIVE Final    Comment: (NOTE) SARS-CoV-2 target nucleic acids are NOT DETECTED. The SARS-CoV-2 RNA is generally detectable in upper and lower respiratory specimens during the acute phase of infection. Negative results do not preclude SARS-CoV-2 infection, do not rule out co-infections with other pathogens, and should not be used as the sole basis for treatment or other patient management decisions. Negative results must be combined with clinical observations, patient history, and epidemiological information. The expected result is Negative. Fact Sheet for Patients: SugarRoll.be Fact Sheet for Healthcare Providers: https://www.woods-mathews.com/ This test is not yet approved or cleared by the [place] FDA and  has been authorized for detection and/or diagnosis of SARS-CoV-2 by FDA under an Emergency Use Authorization (EUA). This EUA will remain  in effect (meaning this test can be used) for the duration of the COVID-19 declaration under Section 56 4(b)(1) of the Act, 21 U.S.C. section 360bbb-3(b)(1), unless the authorization is terminated or revoked sooner. Performed at [facility], [place] [name] [place], [place] [address]   MRSA PCR Screening     Status: None   Collection Time: 12/24/19 11:54 AM   Specimen: Nasal Mucosa; Nasopharyngeal  Result Value Ref Range Status   MRSA by PCR NEGATIVE NEGATIVE Final    Comment:        The GeneXpert MRSA Assay (FDA approved for NASAL specimens only), is one component of a comprehensive MRSA colonization surveillance  program. It is not intended to diagnose MRSA infection nor to guide or monitor treatment for MRSA infections. Performed at [facility], [place] [address], [place], [place] [address]          Radiology Studies: No results found.      Scheduled Meds: . amLODipine  5 mg Oral Daily  . carvedilol  6.25 mg Oral BID WC  . Chlorhexidine Gluconate Cloth  6 each Topical Daily  . coumadin book   Does not apply Once  . feeding supplement (ENSURE ENLIVE)  237 mL Oral TID BM  . guaiFENesin  600 mg Oral BID  . insulin aspart  0-5 Units Subcutaneous QHS  . insulin aspart  0-9 Units Subcutaneous TID WC  . insulin glargine  14 Units Subcutaneous Daily  . levETIRAcetam  500 mg Oral BID  . predniSONE  40 mg Oral QAC breakfast  . senna-docusate  1 tablet Oral BID  . umeclidinium-vilanterol  1 puff Inhalation Daily  . Warfarin - Pharmacist Dosing Inpatient   Does not apply q1800   Continuous Infusions: . sodium chloride 250 mL (12/29/19 0532)     LOS: 7 days    Time spent: 40 minutes.    Irine Seal, MD [facility]   To contact the attending provider between 7A-7P or the covering provider during after hours 7P-7A, please log into the web site [email or web address] and access using universal [place] password for that web site. If you do not have the password, please call the hospital operator.  12/31/2019, 4:10 PM

## 2019-12-31 NOTE — Plan of Care (Signed)
  Problem: Elimination: Goal: Will not experience complications related to bowel motility Outcome: Adequate for Discharge   Problem: Elimination: Goal: Will not experience complications related to urinary retention Outcome: Adequate for Discharge

## 2019-12-31 NOTE — Progress Notes (Signed)
[facility] Progress Note   69 y.o.year-old with h/o pulm HTN, PAD (sp aorto-bifem bypass '16), HTN, DCM EF 20-25% (remote echo), COPD presented w/ abd pain , nausea/ vomiting and diarrhea x1wk + mid upper abd pain. Has COPD on 4L home O2. In ED labs showed creat 14, K 6.3, on lasix and ace inhibitor at home. Pt admitted to CCM. Asked to see for AKI; cr 1.47 on 2/4/21w/ BLcr from Jun 2020-feb 2021 1.4- 1.7.   Assessment/ Plan:   1. AoCKD 3 - in setting of vol depletion/ vomiting/ diarrhea and ACEi. B/l creat is 1.5.Txfor hyperkalemia and po K+ lowering meds; K better now.  - very poor treatment on Wed bec of prior temp cath.  -Appreciate Dr. Earleen Newport (VIR) placing RIJ TC; pt not complaining of pain. No e/o recovery.  - Last  HD Fri with 2L net UF tolerated; will give weekend off. If no signs of recovery by Monday will start CLIP process for AKI + MWF regimen for RRT. Reviewed labs and will plan on HD Mon.  2. DM on insulin 3. Vol depletion -treated 4. Lactic / metabolic acidosis - severebut now resolved.  5. COPD - on homeO2.  Subjective:   Awake and interactive, pleasant. No events overnight; feeling much better after BM this AM. Not c/o pain from TC placement   Objective:   BP 133/66 (BP Location: Right Arm)   Pulse 68   Temp 98 F (36.7 C) (Oral)   Resp 20   Ht 5\' 11"  (1.803 m)   Wt 74.1 kg   SpO2 92%   BMI 22.78 kg/m   Intake/Output Summary (Last 24 hours) at 12/31/2019 0756 Last data filed at 12/31/2019 0600 Gross per 24 hour  Intake 1548 ml  Output 150 ml  Net 1398 ml   Weight change: 1.1 kg  Physical Exam: Genin bedconversesnow, very pleasant No rash, cyanosis or gangrene No jvd or bruits Chest clear bilatto bases, no rales,  RRR no MRG Abd soft ntnd no massor ascites ExttrLEedema, no wounds or ulcers Access: RIJ TC GU: foley to gravity  Imaging: No results found.  Labs: BMET Recent Labs  Lab 12/25/19 0425  12/25/19 1052 12/26/19 0629 12/26/19 1700 12/27/19 0344 12/28/19 1113 12/29/19 0515 12/30/19 0417 12/31/19 0600  NA 140  --  139 136 136 137 136 138 136  K 4.2  4.2   < > 3.5 3.0* 3.9 3.8 4.5 3.6 3.5  CL 95*  --  90* 93* 96* 99 98 101 100  CO2 18*  --  28 26 23 27 24 25 22   GLUCOSE 105*  --  117* 126* 155* 156* 236* 117* 101*  BUN 90*  --  120* 36* 84* 66* 84* 48* 74*  CREATININE 8.87*  --  10.70* 4.24* 8.50* 6.85* 7.78* 4.97* 6.63*  CALCIUM 7.8*  --  7.7* 8.4* 7.8* 7.6* 7.9* 7.6* 7.8*  PHOS 5.5*  5.4*  --  6.1*  --  6.8* 2.8  --  3.4 4.1   < > = values in this interval not displayed.   CBC Recent Labs  Lab 12/28/19 0337 12/29/19 0515 12/30/19 0417 12/31/19 0600  WBC 9.5 7.9 12.9* 12.7*  HGB 10.3* 11.0* 9.7* 9.3*  HCT 30.3* 32.8* 28.6* 27.0*  MCV 86.6 86.8 85.6 85.2  PLT 119* 124* 132* 168    Medications:    . amLODipine  5 mg Oral Daily  . carvedilol  6.25 mg Oral BID WC  . Chlorhexidine Gluconate  Cloth  6 each Topical Daily  . coumadin book   Does not apply Once  . feeding supplement (ENSURE ENLIVE)  237 mL Oral TID BM  . guaiFENesin  600 mg Oral BID  . insulin aspart  0-5 Units Subcutaneous QHS  . insulin aspart  0-9 Units Subcutaneous TID WC  . insulin glargine  14 Units Subcutaneous Daily  . levETIRAcetam  500 mg Oral BID  . predniSONE  40 mg Oral QAC breakfast  . umeclidinium-vilanterol  1 puff Inhalation Daily  . Warfarin - Pharmacist Dosing Inpatient   Does not apply q1800      Otelia Santee, MD 12/31/2019, 7:56 AM

## 2019-12-31 NOTE — Progress Notes (Addendum)
ANTICOAGULATION CONSULT NOTE - Follow-Up  Pharmacy Consult for heparin and warfarin Indication: atrial fibrillation  No Known Allergies  Patient Measurements: Height: 5\' 11"  (180.3 cm) Weight: 163 lb 5.8 oz (74.1 kg) IBW/kg (Calculated) : 75.3  Heparin Dosing Weight: 70.6 kg  Vital Signs: Temp: 98 F (36.7 C) (03/07 0505) Temp Source: Oral (03/07 0505) BP: 133/66 (03/07 0505) Pulse Rate: 68 (03/07 0505)  Labs: Recent Labs    12/28/19 1113 12/28/19 1358 12/29/19 0515 12/29/19 2330 12/30/19 0417 12/30/19 0817 12/30/19 1439 12/31/19 0600  HGB  --    < > 11.0*  --  9.7*  --   --  9.3*  HCT  --   --  32.8*  --  28.6*  --   --  27.0*  PLT  --   --  124*  --  132*  --   --  168  LABPROT 13.3  --   --   --   --  13.9  --  24.4*  INR 1.0  --   --   --   --  1.1  --  2.2*  HEPARINUNFRC  --    < > <0.10*   < >  --  0.50 0.48 0.36  CREATININE 6.85*   < > 7.78*  --  4.97*  --   --  6.63*   < > = values in this interval not displayed.    Estimated Creatinine Clearance: 11.3 mL/min (A) (by C-G formula based on SCr of 6.63 mg/dL (H)).  Assessment: 69 yr old male presented with N/V/D X 1 week, found to have acute renal failure. Pt has hx of a fib, for which he was taking apixaban PTA (last dose on 2/27, due to concern for coffee ground emesis, which has resolved; pt was on SQ heparin). Pharmacy was consulted to dose heparin for a fib. Also consulted to begin warfarin.  Heparin level remains therapeutic at 0.36 on 900 units/hr. INR with rapid rise 1.1 >> 2.2 overnight.  It is possible given limited PO intake that INR is accurate.  Will repeat INR to confirm.  Consider discontinuing heparin if repeat INR remains >2.  Goal of Therapy:  INR 2-3 Heparin level 0.3 - 0.5 units/ml Monitor platelets by anticoagulation protocol: Yes   Plan:  Continue heparin drip at 900 units/hr Follow up with repeat INR ordered for 10AM. Monitor for s/sx of bleeding   Thank you for involving pharmacy  in this patient's care.  [employer], Pharm.D., BCPS Clinical Pharmacist Clinical phone for 12/31/2019 from 8:30-4:00 is [phone].  **Pharmacist phone directory can be found on [email or web address] listed under [place].  12/31/2019 8:42 AM    Addendum:  12/31/2019 10:58 AM   Repeat INR elevated to 2.5.  Discussed rapid INR rise with Dr. Grandville Silos (likely related to acute illness and decreased PO intake).  Dr. Grandville Silos agrees with plans to discontinue heparin infusion.  Plan: Discontinue heparin infusion and associated labs.  No warfarin tonight due to rapid rise in INR. Check INR in AM.  Horton Chin, Pharm.D., BCPS Clinical Pharmacist Clinical phone for 12/31/2019 from 8:30-4:00 is [phone].  **Pharmacist phone directory can be found on [email or web address] listed under [place].  12/31/2019 11:00 AM

## 2020-01-01 ENCOUNTER — Encounter: Payer: Medicare HMO | Admitting: Acute Care

## 2020-01-01 ENCOUNTER — Inpatient Hospital Stay: Admission: RE | Admit: 2020-01-01 | Payer: Medicare HMO | Source: Ambulatory Visit

## 2020-01-01 LAB — RENAL FUNCTION PANEL
Albumin: 2.2 g/dL — ABNORMAL LOW (ref 3.5–5.0)
Anion gap: 15 (ref 5–15)
BUN: 101 mg/dL — ABNORMAL HIGH (ref 8–23)
CO2: 22 mmol/L (ref 22–32)
Calcium: 8.2 mg/dL — ABNORMAL LOW (ref 8.9–10.3)
Chloride: 99 mmol/L (ref 98–111)
Creatinine, Ser: 8.56 mg/dL — ABNORMAL HIGH (ref 0.61–1.24)
GFR calc Af Amer: 7 mL/min — ABNORMAL LOW (ref >60)
GFR calc non Af Amer: 6 mL/min — ABNORMAL LOW (ref >60)
Glucose, Bld: 160 mg/dL — ABNORMAL HIGH (ref 70–99)
Phosphorus: 3.9 mg/dL (ref 2.5–4.6)
Potassium: 3.9 mmol/L (ref 3.5–5.1)
Sodium: 136 mmol/L (ref 135–145)

## 2020-01-01 LAB — GLUCOSE, CAPILLARY
Glucose-Capillary: 101 mg/dL — ABNORMAL HIGH (ref 70–99)
Glucose-Capillary: 102 mg/dL — ABNORMAL HIGH (ref 70–99)
Glucose-Capillary: 124 mg/dL — ABNORMAL HIGH (ref 70–99)
Glucose-Capillary: 198 mg/dL — ABNORMAL HIGH (ref 70–99)

## 2020-01-01 LAB — CBC
HCT: 26.6 % — ABNORMAL LOW (ref 39.0–52.0)
Hemoglobin: 9 g/dL — ABNORMAL LOW (ref 13.0–17.0)
MCH: 29.3 pg (ref 26.0–34.0)
MCHC: 33.8 g/dL (ref 30.0–36.0)
MCV: 86.6 fL (ref 80.0–100.0)
Platelets: 181 10*3/uL (ref 150–400)
RBC: 3.07 MIL/uL — ABNORMAL LOW (ref 4.22–5.81)
RDW: 14.4 % (ref 11.5–15.5)
WBC: 14 10*3/uL — ABNORMAL HIGH (ref 4.0–10.5)
nRBC: 0 % (ref 0.0–0.2)

## 2020-01-01 LAB — PROTIME-INR
INR: 3 — ABNORMAL HIGH (ref 0.8–1.2)
Prothrombin Time: 31.1 seconds — ABNORMAL HIGH (ref 11.4–15.2)

## 2020-01-01 MED ORDER — DARBEPOETIN ALFA 60 MCG/0.3ML IJ SOSY
60.0000 ug | PREFILLED_SYRINGE | INTRAMUSCULAR | Status: DC
  Administered 2020-01-01 – 2020-01-08 (×2): 60 ug via SUBCUTANEOUS
  Filled 2020-01-01 (×2): qty 0.3

## 2020-01-01 MED ORDER — HEPARIN SODIUM (PORCINE) 1000 UNIT/ML IJ SOLN
INTRAMUSCULAR | Status: AC
  Filled 2020-01-01: qty 4

## 2020-01-01 MED ORDER — DARBEPOETIN ALFA 60 MCG/0.3ML IJ SOSY
PREFILLED_SYRINGE | INTRAMUSCULAR | Status: AC
  Filled 2020-01-01: qty 0.3

## 2020-01-01 NOTE — Progress Notes (Signed)
   01/01/20 1500  PT Visit Information  Last PT Received On 01/01/20  Reason Eval/Treat Not Completed Patient at procedure or test/unavailable   Gone to HD when PT arrived and will reattempt at another time.  Mee Hives, PT MS Acute Rehab Dept. Number: [place]

## 2020-01-01 NOTE — Progress Notes (Signed)
PROGRESS NOTE    Kyle [name]  GHW:[id number] DOB: 1951-07-03 DOA: 12/24/2019 PCP: Alonna Buckler, MD    Brief Narrative:  69 year old man with a history of heavy smoking, COPD, peripheral vascular disease presenting with nausea, intermittent vomiting, abd cramping/pain and diarrhea for 1 week found to have acute renal failure, hyperkalemia and severe lactic acidosis of unclear etiology. Patient debated on 12/25/2019 and extubated on 12/26/2019.  Patient back on baseline O2 and following commands appropriately.   Assessment & Plan:   Principal Problem:   Acute renal failure (HCC) Active Problems:   Hypertension   Chronic systolic CHF (congestive heart failure) (HCC)   Dyslipidemia   Aortoiliac occlusive disease (HCC)   COPD with chronic bronchitis and emphysema (HCC)   Chronic respiratory failure with hypoxia (HCC)   Lactic acidosis   Uremia   High anion gap metabolic acidosis   Atrial fibrillation, chronic (HCC)   Diabetes mellitus type 2, insulin dependent (HCC)   Hyperkalemia, diminished renal excretion   Acute metabolic encephalopathy  1 acute metabolic encephalopathy Questionable etiology.  Likely secondary to seizure possibly in the setting of significant uremia, possible old left cortical infarct and acute renal failure.  Patient noted to have a elevated ammonia level of 56.  Patient due to encephalopathy required intubation and subsequently extubated on 12/26/2019.  Head CT done and MRI head negative for any acute abnormalities.  Stat EEG was done which was negative.  Post concern for seizures and patient seen in consultation by neurology.  Patient subsequently placed on Keppra twice daily.  Patient also underwent hemodialysis with nephrology.  Appreciate neurology and nephrology following.  Supportive care.  2.  Acute on chronic hypoxemic respiratory failure likely secondary to COPD exacerbation Clinically improved.  Patient extubated.  Patient back on home O2 at 5 L  nasal cannula.  Continue current regimen of Anoro Ellipta, Mucinex, duo nebs as needed.  IV Solu-Medrol has been transitioned to oral prednisone x5 days per pulmonary recommendations.  Will need to assess O2 requirements on discharge with goal on supplemental oxygen with sats of 88- 92%.  Outpatient follow-up with pulmonary.  3.  Coffee-ground emesis H&H stable.  Continue PPI.  4.  Acute renal failure on chronic kidney disease stage IIIa/hyperkalemia/metabolic acidosis versus progressive chronic kidney disease Secondary to prerenal azotemia in the setting of volume depletion, GI losses from emesis and diarrhea in the setting of ACE inhibitor.  Baseline creatinine noted to be approximately 1.5.  Patient treated for hyperkalemia with resolution of hyperkalemia.  Patient underwent hemodialysis x3.  Nephrology following and per nephrology temporal cath not working well and IR has been requested to convert to a tunneled catheter which was done on 12/28/2019 without any complications.  Patient being followed by nephrology and patient showing no signs of recovery and as such will undergo hemodialysis today.  Nephrology to monitor and if no recovery noted will need access and CLIP process to be started later on this week.  Per nephrology.   5.  Nausea/vomiting/diarrhea/abdominal pain Resolved.  CT abdomen and pelvis renal stone protocol with no source of patient's pain.  PCCM felt patient could possibly have had an ischemic colitis as patient with a history of peripheral artery disease however no acute findings noted.  Continue supportive care.  6.  Paroxysmal atrial fibrillation/dilated cardiomyopathy/aortic stenosis/peripheral vascular disease Patient noted to have had been lost to follow-up with cardiology in the past with some medication noncompliance.  Patient however insists has been compliant with his medications  at home recently.  Patient noted to be on apixaban prior to admission.  Patient currently on IV  heparin which we will monitor for now.  Patient was on apixaban however due to renal function patient likely not a candidate for apixaban anymore.  Patient started on Coumadin for anticoagulation.  INR at 3.0.  Heparin has been discontinued.  Likely hold Coumadin today.  Coreg dose decreased secondary to bradycardia.  Follow.   7.  Hypertension Blood pressure stable on current regimen of Norvasc and Coreg.  Diuretics and ACE inhibitor on hold secondary to problem #4.  Follow.   8.  Right lower lobe lung nodule 5 mm Repeat follow-up CT imaging in 12 months.  Outpatient follow-up with pulmonary.  9.  Insulin-dependent diabetes mellitus Patient noted to be hypoglycemic on admission.  Hemoglobin A1c of 8.0.  CBG of 101 this morning.  Continue Lantus 14 units daily.  Sliding scale insulin.  NovoLog 4 units 3 times daily with meals.  10.  Constipation Resolved with sorbitol 30 mg p.o. every 2 hours x2 doses.  Continue Senokot-S twice daily.     DVT prophylaxis: Heparin Code Status: Full Family Communication: Updated patient.  Disposition Plan:  . Patient came from: Home.            . Anticipated d/c place: To be determined likely home. . Barriers to d/c OR conditions which need to be met to effect a safe d/c: Home when clinically improved, cleared by nephrology.  If no significant improvement with renal function could likely begin CLIP process Monday, 01/01/2020.   Consultants:   Interventional radiology: Dr. Wagner  Neurology: Dr. Lindzen 12/24/2019  Nephrology: Dr. Schertz 12/24/2019  [facility] hospitalist: Dr. Yates 12/24/2019  Procedures:   CT renal stone protocol 12/24/2019  Chest x-ray 12/24/2019, 12/25/2019  MRI brain MRA head 12/24/2019  Tunneled central venous HD catheter placement per IR 12/28/2019  Renal ultrasound 12/24/2019  EEG 12/24/2019--- suggestive of moderate to severe diffuse encephalopathy, nonspecific etiology.  Right IJ HD cath 12/24/2019  ETT 12/25/2019>>>>  12/26/2019  Antimicrobials:   None  Micro data COVID-19 PCR 12/24/2019 - negative   Subjective: Patient watching television.  Denies chest pain or shortness of breath.  States had a bowel movement this morning.   Objective: Vitals:   12/31/19 2145 01/01/20 0500 01/01/20 0546 01/01/20 0850  BP: (!) 148/82  (!) 152/82 (!) 158/78  Pulse: 79  68 67  Resp: 20  19 18  Temp: 98.5 F (36.9 C)  97.9 F (36.6 C) 97.8 F (36.6 C)  TempSrc:    Oral  SpO2: 94%  91% 91%  Weight: 74.1 kg 74.1 kg    Height:        Intake/Output Summary (Last 24 hours) at 01/01/2020 1209 Last data filed at 01/01/2020 0900 Gross per 24 hour  Intake 960 ml  Output 230 ml  Net 730 ml   Filed Weights   12/31/19 0500 12/31/19 2145 01/01/20 0500  Weight: 74.1 kg 74.1 kg 74.1 kg    Examination:  General exam: NAD Respiratory system: Lungs clear to auscultation bilaterally.  No wheezes, no crackles, no rhonchi.  Normal respiratory effort.  Cardiovascular system: Regular rate rhythm no murmurs rubs or gallops.  No JVD.  No lower extremity edema.   Gastrointestinal system: Abdomen is soft, nontender, mildly distended, positive bowel sounds.  No rebound.  No guarding.  Central nervous system: Alert and oriented. No focal neurological deficits. Extremities: Symmetric 5 x 5 power. Skin: No rashes, lesions   or ulcers Psychiatry: Judgement and insight appear normal. Mood & affect appropriate.     Data Reviewed: I have personally reviewed following labs and imaging studies  CBC: Recent Labs  Lab 12/28/19 0337 12/29/19 0515 12/30/19 0417 12/31/19 0600 01/01/20 0420  WBC 9.5 7.9 12.9* 12.7* 14.0*  HGB 10.3* 11.0* 9.7* 9.3* 9.0*  HCT 30.3* 32.8* 28.6* 27.0* 26.6*  MCV 86.6 86.8 85.6 85.2 86.6  PLT 119* 124* 132* 168 181   Basic Metabolic Panel: Recent Labs  Lab 12/27/19 0344 12/27/19 0344 12/28/19 1113 12/29/19 0515 12/30/19 0417 12/31/19 0600 01/01/20 0420  NA 136   < > 137 136 138 136 136  K  3.9   < > 3.8 4.5 3.6 3.5 3.9  CL 96*   < > 99 98 101 100 99  CO2 23   < > 27 24 25 22 22  GLUCOSE 155*   < > 156* 236* 117* 101* 160*  BUN 84*   < > 66* 84* 48* 74* 101*  CREATININE 8.50*   < > 6.85* 7.78* 4.97* 6.63* 8.56*  CALCIUM 7.8*   < > 7.6* 7.9* 7.6* 7.8* 8.2*  MG  --   --  1.9  --   --   --   --   PHOS 6.8*  --  2.8  --  3.4 4.1 3.9   < > = values in this interval not displayed.   GFR: Estimated Creatinine Clearance: 8.8 mL/min (A) (by C-G formula based on SCr of 8.56 mg/dL (H)). Liver Function Tests: Recent Labs  Lab 12/26/19 0628 12/26/19 0629 12/27/19 0344 12/28/19 1113 12/30/19 0417 12/31/19 0600 01/01/20 0420  AST 31  --  25  --   --   --   --   ALT 30  --  26  --   --   --   --   ALKPHOS 55  --  51  --   --   --   --   BILITOT 1.5*  --  1.5*  --   --   --   --   PROT 5.5*  --  5.4*  --   --   --   --   ALBUMIN 2.3*   < > 2.2*  2.2* 2.1* 2.1* 2.2* 2.2*   < > = values in this interval not displayed.   No results for input(s): LIPASE, AMYLASE in the last 168 hours. No results for input(s): AMMONIA in the last 168 hours. Coagulation Profile: Recent Labs  Lab 12/28/19 1113 12/30/19 0817 12/31/19 0600 12/31/19 0935 01/01/20 0420  INR 1.0 1.1 2.2* 2.5* 3.0*   Cardiac Enzymes: No results for input(s): CKTOTAL, CKMB, CKMBINDEX, TROPONINI in the last 168 hours. BNP (last 3 results) No results for input(s): PROBNP in the last 8760 hours. HbA1C: No results for input(s): HGBA1C in the last 72 hours. CBG: Recent Labs  Lab 12/31/19 1128 12/31/19 1607 12/31/19 2145 01/01/20 0752 01/01/20 1117  GLUCAP 143* 358* 257* 101* 102*   Lipid Profile: No results for input(s): CHOL, HDL, LDLCALC, TRIG, CHOLHDL, LDLDIRECT in the last 72 hours. Thyroid Function Tests: No results for input(s): TSH, T4TOTAL, FREET4, T3FREE, THYROIDAB in the last 72 hours. Anemia Panel: No results for input(s): VITAMINB12, FOLATE, FERRITIN, TIBC, IRON, RETICCTPCT in the last 72  hours. Sepsis Labs: Recent Labs  Lab 12/25/19 1630 12/25/19 1855  LATICACIDVEN 2.8* 2.8*    Recent Results (from the past 240 hour(s))  SARS CORONAVIRUS 2 (TAT 6-24 HRS) Nasopharyngeal Nasopharyngeal   Swab     Status: None   Collection Time: 12/24/19  6:54 AM   Specimen: Nasopharyngeal Swab  Result Value Ref Range Status   SARS Coronavirus 2 NEGATIVE NEGATIVE Final    Comment: (NOTE) SARS-CoV-2 target nucleic acids are NOT DETECTED. The SARS-CoV-2 RNA is generally detectable in upper and lower respiratory specimens during the acute phase of infection. Negative results do not preclude SARS-CoV-2 infection, do not rule out co-infections with other pathogens, and should not be used as the sole basis for treatment or other patient management decisions. Negative results must be combined with clinical observations, patient history, and epidemiological information. The expected result is Negative. Fact Sheet for Patients: SugarRoll.be Fact Sheet for Healthcare Providers: https://www.woods-mathews.com/ This test is not yet approved or cleared by the [place] FDA and  has been authorized for detection and/or diagnosis of SARS-CoV-2 by FDA under an Emergency Use Authorization (EUA). This EUA will remain  in effect (meaning this test can be used) for the duration of the COVID-19 declaration under Section 56 4(b)(1) of the Act, 21 U.S.C. section 360bbb-3(b)(1), unless the authorization is terminated or revoked sooner. Performed at [facility], [place] [address], [place], [place] [address]   MRSA PCR Screening     Status: None   Collection Time: 12/24/19 11:54 AM   Specimen: Nasal Mucosa; Nasopharyngeal  Result Value Ref Range Status   MRSA by PCR NEGATIVE NEGATIVE Final    Comment:        The GeneXpert MRSA Assay (FDA approved for NASAL specimens only), is one component of a comprehensive MRSA colonization surveillance program. It  is not intended to diagnose MRSA infection nor to guide or monitor treatment for MRSA infections. Performed at [facility], [place] [address] [place], [place] [address]          Radiology Studies: No results found.      Scheduled Meds: . amLODipine  5 mg Oral Daily  . carvedilol  6.25 mg Oral BID WC  . Chlorhexidine Gluconate Cloth  6 each Topical Daily  . coumadin book   Does not apply Once  . feeding supplement (ENSURE ENLIVE)  237 mL Oral TID BM  . guaiFENesin  600 mg Oral BID  . insulin aspart  0-5 Units Subcutaneous QHS  . insulin aspart  0-9 Units Subcutaneous TID WC  . insulin aspart  4 Units Subcutaneous TID WC  . insulin glargine  14 Units Subcutaneous Daily  . levETIRAcetam  500 mg Oral BID  . predniSONE  40 mg Oral QAC breakfast  . senna-docusate  1 tablet Oral BID  . umeclidinium-vilanterol  1 puff Inhalation Daily  . Warfarin - Pharmacist Dosing Inpatient   Does not apply q1800   Continuous Infusions: . sodium chloride 250 mL (12/29/19 0532)     LOS: 8 days    Time spent: 40 minutes.    Irine Seal, MD [facility]   To contact the attending provider between 7A-7P or the covering provider during after hours 7P-7A, please log into the web site [email or web address] and access using universal [place] password for that web site. If you do not have the password, please call the hospital operator.  01/01/2020, 12:09 PM

## 2020-01-01 NOTE — Progress Notes (Signed)
[facility] Progress Note   69 y.o.year-old with h/o pulm HTN, PAD (sp aorto-bifem bypass '16), HTN, DCM EF 20-25% (remote echo), COPD- chronically on 4 L  presented w/ abd pain , nausea/ vomiting and diarrhea x1wk + mid upper abd pain.  In ED labs showed creat 14, K 6.3, on lasix and ace inhibitor at home. Pt admitted to CCM. Asked to see for AKI; cr 1.47 on 2/4/21w/ BLcr from Jun 2020-feb 2021 1.4- 1.7.   Assessment/ Plan:   AoCKD 3 - in setting of vol depletion/ vomiting/ diarrhea and ACEi. B/l creat is 1.5.urine shows 100 of protein, few RBC -  HD started on 2/28- last on Fri3/5 - (VIR) placed RIJ TC;   - Last  HD Fri  Tolerated well; minimal UOP over weekend-  BUN and crt climbing- does not seem to be showing signs of recovery- will need HD today and since crt so good 5 weeks ago need to watch a little longer- if no signs of recovery will need access and CLIP- a little later this week 2. DM on insulin 3. Vol depletion - treated-  BP in the 150's-  norvasc 5 and coreg 6.25 BID 4. Lactic / metabolic acidosis - severebut now resolved.  5. COPD - on homeO2. 6. Anemia-  Check iron stores and start ESA 7. Bones-  Phos OK- check PTH  Subjective:     UOP pidking up a little -  He looks great- not uremic Objective:   BP (!) 158/78 (BP Location: Left Arm)   Pulse 67   Temp 97.8 F (36.6 C) (Oral)   Resp 18   Ht 5\' 11"  (1.803 m)   Wt 74.1 kg   SpO2 91%   BMI 22.78 kg/m   Intake/Output Summary (Last 24 hours) at 01/01/2020 1241 Last data filed at 01/01/2020 0900 Gross per 24 hour  Intake 960 ml  Output 230 ml  Net 730 ml   Weight change: 0.005 kg  Physical Exam: Genin bedconversesnow, very pleasant No rash, cyanosis or gangrene No jvd or bruits Chest clear bilatto bases, no rales, +rhonchi RRR no MRG Abd soft ntnd no massor ascites ExttrLEedema, no wounds or ulcers Access: RIJtemp- in place for 3 days  GU: foley to  gravity  Imaging: No results found.  Labs: BMET Recent Labs  Lab 12/26/19 0629 12/26/19 0629 12/26/19 1700 12/27/19 0344 12/28/19 1113 12/29/19 0515 12/30/19 0417 12/31/19 0600 01/01/20 0420  NA 139   < > 136 136 137 136 138 136 136  K 3.5   < > 3.0* 3.9 3.8 4.5 3.6 3.5 3.9  CL 90*   < > 93* 96* 99 98 101 100 99  CO2 28   < > 26 23 27 24 25 22 22   GLUCOSE 117*   < > 126* 155* 156* 236* 117* 101* 160*  BUN 120*   < > 36* 84* 66* 84* 48* 74* 101*  CREATININE 10.70*   < > 4.24* 8.50* 6.85* 7.78* 4.97* 6.63* 8.56*  CALCIUM 7.7*   < > 8.4* 7.8* 7.6* 7.9* 7.6* 7.8* 8.2*  PHOS 6.1*  --   --  6.8* 2.8  --  3.4 4.1 3.9   < > = values in this interval not displayed.   CBC Recent Labs  Lab 12/29/19 0515 12/30/19 0417 12/31/19 0600 01/01/20 0420  WBC 7.9 12.9* 12.7* 14.0*  HGB 11.0* 9.7* 9.3* 9.0*  HCT 32.8* 28.6* 27.0* 26.6*  MCV 86.8 85.6 85.2 86.6  PLT  124* 132* 168 181    Medications:    . amLODipine  5 mg Oral Daily  . carvedilol  6.25 mg Oral BID WC  . Chlorhexidine Gluconate Cloth  6 each Topical Daily  . coumadin book   Does not apply Once  . feeding supplement (ENSURE ENLIVE)  237 mL Oral TID BM  . guaiFENesin  600 mg Oral BID  . insulin aspart  0-5 Units Subcutaneous QHS  . insulin aspart  0-9 Units Subcutaneous TID WC  . insulin aspart  4 Units Subcutaneous TID WC  . insulin glargine  14 Units Subcutaneous Daily  . levETIRAcetam  500 mg Oral BID  . predniSONE  40 mg Oral QAC breakfast  . senna-docusate  1 tablet Oral BID  . umeclidinium-vilanterol  1 puff Inhalation Daily  . Warfarin - Pharmacist Dosing Inpatient   Does not apply q1800      Louis Meckel  01/01/2020, 12:41 PM

## 2020-01-01 NOTE — Progress Notes (Signed)
ANTICOAGULATION CONSULT NOTE - Follow-Up  Pharmacy Consult for warfarin Indication: atrial fibrillation  No Known Allergies  Patient Measurements: Height: 5\' 11"  (180.3 cm) Weight: 163 lb 5.8 oz (74.1 kg) IBW/kg (Calculated) : 75.3  Heparin Dosing Weight: 70.6 kg  Vital Signs: Temp: 97.9 F (36.6 C) (03/08 0546) BP: 152/82 (03/08 0546) Pulse Rate: 68 (03/08 0546)  Labs: Recent Labs    12/29/19 2330 12/30/19 0417 12/30/19 0817 12/30/19 0817 12/30/19 1439 12/31/19 0600 12/31/19 0935 01/01/20 0420  HGB  --  9.7*  --    < >  --  9.3*  --  9.0*  HCT  --  28.6*  --   --   --  27.0*  --  26.6*  PLT  --  132*  --   --   --  168  --  181  LABPROT  --   --  13.9   < >  --  24.4* 26.5* 31.1*  INR  --   --  1.1   < >  --  2.2* 2.5* 3.0*  HEPARINUNFRC   < >  --  0.50  --  0.48 0.36  --   --   CREATININE  --  4.97*  --   --   --  6.63*  --  8.56*   < > = values in this interval not displayed.    Estimated Creatinine Clearance: 8.8 mL/min (A) (by C-G formula based on SCr of 8.56 mg/dL (H)).  Assessment: 69 yr old male presented with N/V/D X 1 week, found to have acute renal failure. Pt has hx of a fib, for which he was taking apixaban PTA (last dose on 2/27, due to concern for coffee ground emesis, which has resolved; pt was on SQ heparin). Pharmacy was consulted to dose warfarin for Afib.  INR with rapid rise 1.1 >> 2.2 > 3 after 2 previous doses of warfarin. PO intake reduced. Will resume at lower dose as INR begins to trend down.  Goal of Therapy:  INR 2-3 Monitor platelets by anticoagulation protocol: Yes   Plan:  No warfarin tonight Daily INR Monitor for s/sx of bleeding  Thank you for involving pharmacy in this patient's care.  Renold Genta, PharmD, BCPS Clinical Pharmacist Clinical phone for 01/01/2020 until 3p is [phone] 01/01/2020 8:47 AM  **Pharmacist phone directory can be found on [email or web address] listed under [place]**

## 2020-01-01 NOTE — Procedures (Signed)
Patient was seen on dialysis and the procedure was supervised.  BFR 400  Via vascath BP is  158/78.   Patient appears to be tolerating treatment well  Louis Meckel 01/01/2020

## 2020-01-02 LAB — RENAL FUNCTION PANEL
Albumin: 2.5 g/dL — ABNORMAL LOW (ref 3.5–5.0)
Anion gap: 15 (ref 5–15)
BUN: 50 mg/dL — ABNORMAL HIGH (ref 8–23)
CO2: 21 mmol/L — ABNORMAL LOW (ref 22–32)
Calcium: 8.7 mg/dL — ABNORMAL LOW (ref 8.9–10.3)
Chloride: 103 mmol/L (ref 98–111)
Creatinine, Ser: 5.37 mg/dL — ABNORMAL HIGH (ref 0.61–1.24)
GFR calc Af Amer: 12 mL/min — ABNORMAL LOW (ref >60)
GFR calc non Af Amer: 10 mL/min — ABNORMAL LOW (ref >60)
Glucose, Bld: 150 mg/dL — ABNORMAL HIGH (ref 70–99)
Phosphorus: 4.3 mg/dL (ref 2.5–4.6)
Potassium: 4.7 mmol/L (ref 3.5–5.1)
Sodium: 139 mmol/L (ref 135–145)

## 2020-01-02 LAB — GLUCOSE, CAPILLARY
Glucose-Capillary: 135 mg/dL — ABNORMAL HIGH (ref 70–99)
Glucose-Capillary: 249 mg/dL — ABNORMAL HIGH (ref 70–99)
Glucose-Capillary: 175 mg/dL — ABNORMAL HIGH (ref 70–99)
Glucose-Capillary: 132 mg/dL — ABNORMAL HIGH (ref 70–99)

## 2020-01-02 LAB — IRON AND TIBC
Iron: 22 ug/dL — ABNORMAL LOW (ref 45–182)
Saturation Ratios: 8 % — ABNORMAL LOW (ref 17.9–39.5)
TIBC: 269 ug/dL (ref 250–450)
UIBC: 247 ug/dL

## 2020-01-02 LAB — CBC
HCT: 29.4 % — ABNORMAL LOW (ref 39.0–52.0)
Hemoglobin: 9.8 g/dL — ABNORMAL LOW (ref 13.0–17.0)
MCH: 29.3 pg (ref 26.0–34.0)
MCHC: 33.3 g/dL (ref 30.0–36.0)
MCV: 88 fL (ref 80.0–100.0)
Platelets: 210 10*3/uL (ref 150–400)
RBC: 3.34 MIL/uL — ABNORMAL LOW (ref 4.22–5.81)
RDW: 14.6 % (ref 11.5–15.5)
WBC: 11.2 10*3/uL — ABNORMAL HIGH (ref 4.0–10.5)
nRBC: 0 % (ref 0.0–0.2)

## 2020-01-02 LAB — FERRITIN: Ferritin: 141 ng/mL (ref 24–336)

## 2020-01-02 LAB — PROTIME-INR
INR: 1.5 — ABNORMAL HIGH (ref 0.8–1.2)
Prothrombin Time: 17.9 seconds — ABNORMAL HIGH (ref 11.4–15.2)

## 2020-01-02 MED ORDER — RENA-VITE PO TABS
1.0000 | ORAL_TABLET | Freq: Every day | ORAL | Status: DC
  Administered 2020-01-02 – 2020-01-10 (×9): 1 via ORAL
  Filled 2020-01-02 (×9): qty 1

## 2020-01-02 MED ORDER — NEPRO/CARBSTEADY PO LIQD
237.0000 mL | Freq: Three times a day (TID) | ORAL | Status: DC
  Administered 2020-01-02 – 2020-01-11 (×25): 237 mL via ORAL

## 2020-01-02 MED ORDER — WARFARIN SODIUM 5 MG PO TABS
5.0000 mg | ORAL_TABLET | Freq: Once | ORAL | Status: AC
  Administered 2020-01-02: 5 mg via ORAL
  Filled 2020-01-02: qty 1

## 2020-01-02 MED ORDER — SODIUM CHLORIDE 0.9 % IV SOLN
510.0000 mg | Freq: Once | INTRAVENOUS | Status: AC
  Administered 2020-01-02: 510 mg via INTRAVENOUS
  Filled 2020-01-02: qty 17

## 2020-01-02 NOTE — Progress Notes (Signed)
PROGRESS NOTE    Kyle [name]  ZOX:[id number] DOB: 1951/03/08 DOA: 12/24/2019 PCP: Alonna Buckler, MD    Brief Narrative:  69 year old man with a history of heavy smoking, COPD, peripheral vascular disease presenting with nausea, intermittent vomiting, abd cramping/pain and diarrhea for 1 week found to have acute renal failure, hyperkalemia and severe lactic acidosis of unclear etiology. Patient debated on 12/25/2019 and extubated on 12/26/2019.  Patient back on baseline O2 and following commands appropriately.   Assessment & Plan:   Principal Problem:   Acute renal failure (HCC) Active Problems:   Hypertension   Chronic systolic CHF (congestive heart failure) (HCC)   Dyslipidemia   Aortoiliac occlusive disease (HCC)   COPD with chronic bronchitis and emphysema (HCC)   Chronic respiratory failure with hypoxia (HCC)   Lactic acidosis   Uremia   High anion gap metabolic acidosis   Atrial fibrillation, chronic (HCC)   Diabetes mellitus type 2, insulin dependent (HCC)   Hyperkalemia, diminished renal excretion   Acute metabolic encephalopathy  1 acute metabolic encephalopathy Questionable etiology.  Likely secondary to seizure possibly in the setting of significant uremia, possible old left cortical infarct and acute renal failure.  Patient noted to have a elevated ammonia level of 56.  Patient due to encephalopathy required intubation and subsequently extubated on 12/26/2019.  Head CT done and MRI head negative for any acute abnormalities.  Stat EEG was done which was negative.  Concern for seizures and patient seen in consultation by neurology.  Patient subsequently placed on Keppra twice daily.  Patient also underwent hemodialysis with nephrology.  Appreciate neurology and nephrology following.  Supportive care.  2.  Acute on chronic hypoxemic respiratory failure likely secondary to COPD exacerbation Clinically improved.  Patient extubated.  Patient back on home O2 at 5 L nasal  cannula.  Continue current regimen of Anoro Ellipta, Mucinex, duo nebs as needed.  IV Solu-Medrol has been transitioned to oral prednisone x5 days per pulmonary recommendations.  Will need to assess O2 requirements on discharge with goal on supplemental oxygen with sats of 88- 92%.  Outpatient follow-up with pulmonary.  3.  Coffee-ground emesis H&H stable.  Continue PPI.  4.  Acute renal failure on chronic kidney disease stage IIIa/hyperkalemia/metabolic acidosis versus progressive chronic kidney disease Secondary to prerenal azotemia in the setting of volume depletion, GI losses from emesis and diarrhea in the setting of ACE inhibitor.  Baseline creatinine noted to be approximately 1.5.  Patient treated for hyperkalemia with resolution of hyperkalemia.  Patient underwent hemodialysis x3.  Nephrology following and per nephrology temporal cath not working well and IR has converted to a tunneled catheter which was done on 12/28/2019 without any complications.  Patient being followed by nephrology and patient showing no signs of recovery and as such continuing to undergo hemodialysis.  Nephrology to monitor and if no recovery noted will need access and CLIP process to be started later on this week.  Per nephrology.   5.  Nausea/vomiting/diarrhea/abdominal pain Resolved.  CT abdomen and pelvis renal stone protocol with no source of patient's abdominal pain.  No further nausea or emesis.  PCCM felt patient could possibly have had an ischemic colitis as patient with a history of peripheral artery disease however no acute findings noted.  Continue supportive care.  6.  Paroxysmal atrial fibrillation/dilated cardiomyopathy/aortic stenosis/peripheral vascular disease Patient noted to have had been lost to follow-up with cardiology in the past with some medication noncompliance.  Patient however insists has been compliant  with his medications at home recently.  Patient noted to be on apixaban prior to admission.   Patient initially was on IV heparin however has been transitioned to Coumadin.  Patient was on apixaban however due to renal function patient likely not a candidate for apixaban anymore.  Patient started on Coumadin for anticoagulation.  INR at 1.5 today from 3.0 yesterday.  Coumadin per pharmacy.  Continue current dose of Coreg which was decreased due to bradycardia.  Follow.   7.  Hypertension Regimen of Norvasc and Coreg.  Diuretics and ACE inhibitor held secondary to problem #4.  Patient also receiving hemodialysis.  Follow.   8.  Right lower lobe lung nodule 5 mm Repeat follow-up CT imaging in 12 months.  Outpatient follow-up with pulmonary.  9.  Insulin-dependent diabetes mellitus Patient noted to be hypoglycemic on admission.  Hemoglobin A1c of 8.0.  CBG of 135 this morning.  Continue Lantus 14 units daily.  Sliding scale insulin.  NovoLog 4 units 3 times daily with meals.  10.  Constipation Resolved with sorbitol 30 mg p.o. every 2 hours x2 doses.  Continue Senokot-S twice daily.     DVT prophylaxis: Heparin Code Status: Full Family Communication: Updated patient.  Disposition Plan:  . Patient came from: Home.            . Anticipated d/c place: To be determined likely home. . Barriers to d/c OR conditions which need to be met to effect a safe d/c: Home when clinically improved, cleared by nephrology.  If no significant improvement with renal function could likely begin CLIP process later this week per nephrology.    Consultants:   Interventional radiology: Dr. Earleen Newport  Neurology: Dr. Cheral Marker 12/24/2019  Nephrology: Dr. Jonnie Finner 12/24/2019  [facility] hospitalist: Dr. Lorin Mercy 12/24/2019  Procedures:   CT renal stone protocol 12/24/2019  Chest x-ray 12/24/2019, 12/25/2019  MRI brain MRA head 12/24/2019  Tunneled central venous HD catheter placement per IR 12/28/2019  Renal ultrasound 12/24/2019  EEG 12/24/2019--- suggestive of moderate to severe diffuse encephalopathy, nonspecific  etiology.  Right IJ HD cath 12/24/2019  ETT 12/25/2019>>>> 12/26/2019  Antimicrobials:   None  Micro data COVID-19 PCR 12/24/2019 - negative   Subjective: Patient in bed watching television.  Feeling better.  Denies any chest pain or shortness of breath.  Denies any constipation.  Objective: Vitals:   01/02/20 0424 01/02/20 0854 01/02/20 0930 01/02/20 1059  BP: 134/77  128/64   Pulse: (!) 59 79 87   Resp: '20 18 18   '$ Temp: 98.5 F (36.9 C)  97.6 F (36.4 C)   TempSrc: Oral  Oral   SpO2: 92% 94% 94% 95%  Weight:      Height:        Intake/Output Summary (Last 24 hours) at 01/02/2020 1232 Last data filed at 01/02/2020 1227 Gross per 24 hour  Intake 1320 ml  Output 2959 ml  Net -1639 ml   Filed Weights   01/01/20 0500 01/01/20 1258 01/01/20 1700  Weight: 74.1 kg 74.2 kg 73.2 kg    Examination:  General exam: NAD Respiratory system: Clear to auscultation bilaterally.  No wheezes, no crackles, no rhonchi.  Normal respiratory effort. Cardiovascular system: RRR no murmurs rubs or gallops.  No JVD.  No lower extremity edema.  Gastrointestinal system: Abdomen is nontender, soft, mildly distended, positive bowel sounds.  No rebound.  No guarding.  Central nervous system: Alert and oriented. No focal neurological deficits. Extremities: Symmetric 5 x 5 power. Skin: No rashes, lesions or  ulcers Psychiatry: Judgement and insight appear normal. Mood & affect appropriate.     Data Reviewed: I have personally reviewed following labs and imaging studies  CBC: Recent Labs  Lab 12/29/19 0515 12/30/19 0417 12/31/19 0600 01/01/20 0420 01/02/20 0518  WBC 7.9 12.9* 12.7* 14.0* 11.2*  HGB 11.0* 9.7* 9.3* 9.0* 9.8*  HCT 32.8* 28.6* 27.0* 26.6* 29.4*  MCV 86.8 85.6 85.2 86.6 88.0  PLT 124* 132* 168 181 510   Basic Metabolic Panel: Recent Labs  Lab 12/28/19 1113 12/28/19 1113 12/29/19 0515 12/30/19 0417 12/31/19 0600 01/01/20 0420 01/02/20 0518  NA 137   < > 136 138 136  136 139  K 3.8   < > 4.5 3.6 3.5 3.9 4.7  CL 99   < > 98 101 100 99 103  CO2 27   < > '24 25 22 22 '$ 21*  GLUCOSE 156*   < > 236* 117* 101* 160* 150*  BUN 66*   < > 84* 48* 74* 101* 50*  CREATININE 6.85*   < > 7.78* 4.97* 6.63* 8.56* 5.37*  CALCIUM 7.6*   < > 7.9* 7.6* 7.8* 8.2* 8.7*  MG 1.9  --   --   --   --   --   --   PHOS 2.8  --   --  3.4 4.1 3.9 4.3   < > = values in this interval not displayed.   GFR: Estimated Creatinine Clearance: 13.8 mL/min (A) (by C-G formula based on SCr of 5.37 mg/dL (H)). Liver Function Tests: Recent Labs  Lab 12/27/19 0344 12/27/19 0344 12/28/19 1113 12/30/19 0417 12/31/19 0600 01/01/20 0420 01/02/20 0518  AST 25  --   --   --   --   --   --   ALT 26  --   --   --   --   --   --   ALKPHOS 51  --   --   --   --   --   --   BILITOT 1.5*  --   --   --   --   --   --   PROT 5.4*  --   --   --   --   --   --   ALBUMIN 2.2*  2.2*   < > 2.1* 2.1* 2.2* 2.2* 2.5*   < > = values in this interval not displayed.   No results for input(s): LIPASE, AMYLASE in the last 168 hours. No results for input(s): AMMONIA in the last 168 hours. Coagulation Profile: Recent Labs  Lab 12/30/19 0817 12/31/19 0600 12/31/19 0935 01/01/20 0420 01/02/20 0518  INR 1.1 2.2* 2.5* 3.0* 1.5*   Cardiac Enzymes: No results for input(s): CKTOTAL, CKMB, CKMBINDEX, TROPONINI in the last 168 hours. BNP (last 3 results) No results for input(s): PROBNP in the last 8760 hours. HbA1C: No results for input(s): HGBA1C in the last 72 hours. CBG: Recent Labs  Lab 01/01/20 1117 01/01/20 1734 01/01/20 2109 01/02/20 0650 01/02/20 1130  GLUCAP 102* 124* 198* 135* 249*   Lipid Profile: No results for input(s): CHOL, HDL, LDLCALC, TRIG, CHOLHDL, LDLDIRECT in the last 72 hours. Thyroid Function Tests: No results for input(s): TSH, T4TOTAL, FREET4, T3FREE, THYROIDAB in the last 72 hours. Anemia Panel: Recent Labs    01/02/20 0518  FERRITIN 141  TIBC 269  IRON 22*   Sepsis  Labs: No results for input(s): PROCALCITON, LATICACIDVEN in the last 168 hours.  Recent Results (from the past 240 hour(s))  SARS CORONAVIRUS 2 (TAT 6-24 HRS) Nasopharyngeal Nasopharyngeal Swab     Status: None   Collection Time: 12/24/19  6:54 AM   Specimen: Nasopharyngeal Swab  Result Value Ref Range Status   SARS Coronavirus 2 NEGATIVE NEGATIVE Final    Comment: (NOTE) SARS-CoV-2 target nucleic acids are NOT DETECTED. The SARS-CoV-2 RNA is generally detectable in upper and lower respiratory specimens during the acute phase of infection. Negative results do not preclude SARS-CoV-2 infection, do not rule out co-infections with other pathogens, and should not be used as the sole basis for treatment or other patient management decisions. Negative results must be combined with clinical observations, patient history, and epidemiological information. The expected result is Negative. Fact Sheet for Patients: SugarRoll.be Fact Sheet for Healthcare Providers: https://www.woods-mathews.com/ This test is not yet approved or cleared by the [place] FDA and  has been authorized for detection and/or diagnosis of SARS-CoV-2 by FDA under an Emergency Use Authorization (EUA). This EUA will remain  in effect (meaning this test can be used) for the duration of the COVID-19 declaration under Section 56 4(b)(1) of the Act, 21 U.S.C. section 360bbb-3(b)(1), unless the authorization is terminated or revoked sooner. Performed at [facility], [place] [address], [place], [place] [address]   MRSA PCR Screening     Status: None   Collection Time: 12/24/19 11:54 AM   Specimen: Nasal Mucosa; Nasopharyngeal  Result Value Ref Range Status   MRSA by PCR NEGATIVE NEGATIVE Final    Comment:        The GeneXpert MRSA Assay (FDA approved for NASAL specimens only), is one component of a comprehensive MRSA colonization surveillance program. It is not intended  to diagnose MRSA infection nor to guide or monitor treatment for MRSA infections. Performed at [facility], [place] [address] [place], [place] [address]          Radiology Studies: No results found.      Scheduled Meds: . amLODipine  5 mg Oral Daily  . carvedilol  6.25 mg Oral BID WC  . Chlorhexidine Gluconate Cloth  6 each Topical Daily  . coumadin book   Does not apply Once  . darbepoetin (ARANESP) injection - NON-DIALYSIS  60 mcg Subcutaneous Q Mon-1800  . feeding supplement (ENSURE ENLIVE)  237 mL Oral TID BM  . guaiFENesin  600 mg Oral BID  . insulin aspart  0-5 Units Subcutaneous QHS  . insulin aspart  0-9 Units Subcutaneous TID WC  . insulin aspart  4 Units Subcutaneous TID WC  . insulin glargine  14 Units Subcutaneous Daily  . levETIRAcetam  500 mg Oral BID  . senna-docusate  1 tablet Oral BID  . umeclidinium-vilanterol  1 puff Inhalation Daily  . warfarin  5 mg Oral ONCE-1800  . Warfarin - Pharmacist Dosing Inpatient   Does not apply q1800   Continuous Infusions: . sodium chloride 250 mL (12/29/19 0532)     LOS: 9 days    Time spent: 40 minutes.    Irine Seal, MD [facility]   To contact the attending provider between 7A-7P or the covering provider during after hours 7P-7A, please log into the web site [email or web address] and access using universal [place] password for that web site. If you do not have the password, please call the hospital operator.  01/02/2020, 12:32 PM

## 2020-01-02 NOTE — Progress Notes (Signed)
[name] Progress Note   69 y.o.year-old with h/o pulm HTN, PAD (sp aorto-bifem bypass '16), HTN, DCM EF 20-25% (remote echo), COPD- chronically on 4 L  presented w/ abd pain , nausea/ vomiting and diarrhea x1wk + mid upper abd pain.  In ED labs showed creat 14, K 6.3, on lasix and ace inhibitor at home.  Asked to see for AKI; cr 1.47 on 2/4/21w/ BLcr from Jun 2020-feb 2021 1.4- 1.7.   Assessment/ Plan:   AoCKD 3 - in setting of vol depletion/ vomiting/ diarrhea and ACEi. B/l creat is 1.5.urine shows 100 of protein, few RBC -  HD started on 2/28- last on Mon 3/8 - (VIR) placed RIJ TC;   - Last  HD yest  - does not seem to be showing signs of recovery yet-  since crt so good 5 weeks ago need to watch a little longer- daily labs- if no signs of recovery will need access and CLIP- a little later this week- still hopeful for possible recovery 2. DM on insulin 3. Vol depletion - treated-  BP in the 150's-  norvasc 5 and coreg 6.25 BID 4. Lactic / metabolic acidosis - severebut now resolved.  5. COPD - on homeO2. 6. Anemia-  iron stores low, will replete - started ESA 7. Bones-  Phos OK- check PTH  Subjective:   HD yest-  Removed 2500- tolerated well.  Labs reflective of HD-  300 uop....better-  He looks fantastic   Objective:   BP 128/64 (BP Location: Right Arm)   Pulse 87   Temp 97.6 F (36.4 C) (Oral)   Resp 18   Ht 5\' 11"  (1.803 m)   Wt 73.2 kg   SpO2 95%   BMI 22.51 kg/m   Intake/Output Summary (Last 24 hours) at 01/02/2020 1249 Last data filed at 01/02/2020 1227 Gross per 24 hour  Intake 1320 ml  Output 2959 ml  Net -1639 ml   Weight change: 0.095 kg  Physical Exam: Genin bedconverses, very pleasant No rash, cyanosis or gangrene No jvd or bruits Chest clear bilatto bases, no rales, +rhonchi RRR no MRG Abd soft ntnd no massor ascites ExttrLEedema, no wounds or ulcers Access: RIJtemp- in place for 4 days  GU: foley to  gravity  Imaging: No results found.  Labs: BMET Recent Labs  Lab 12/27/19 0344 12/28/19 1113 12/29/19 0515 12/30/19 0417 12/31/19 0600 01/01/20 0420 01/02/20 0518  NA 136 137 136 138 136 136 139  K 3.9 3.8 4.5 3.6 3.5 3.9 4.7  CL 96* 99 98 101 100 99 103  CO2 23 27 24 25 22 22  21*  GLUCOSE 155* 156* 236* 117* 101* 160* 150*  BUN 84* 66* 84* 48* 74* 101* 50*  CREATININE 8.50* 6.85* 7.78* 4.97* 6.63* 8.56* 5.37*  CALCIUM 7.8* 7.6* 7.9* 7.6* 7.8* 8.2* 8.7*  PHOS 6.8* 2.8  --  3.4 4.1 3.9 4.3   CBC Recent Labs  Lab 12/30/19 0417 12/31/19 0600 01/01/20 0420 01/02/20 0518  WBC 12.9* 12.7* 14.0* 11.2*  HGB 9.7* 9.3* 9.0* 9.8*  HCT 28.6* 27.0* 26.6* 29.4*  MCV 85.6 85.2 86.6 88.0  PLT 132* 168 181 210    Medications:    . amLODipine  5 mg Oral Daily  . carvedilol  6.25 mg Oral BID WC  . Chlorhexidine Gluconate Cloth  6 each Topical Daily  . coumadin book   Does not apply Once  . darbepoetin (ARANESP) injection - NON-DIALYSIS  60 mcg Subcutaneous Q Mon-1800  .  feeding supplement (ENSURE ENLIVE)  237 mL Oral TID BM  . guaiFENesin  600 mg Oral BID  . insulin aspart  0-5 Units Subcutaneous QHS  . insulin aspart  0-9 Units Subcutaneous TID WC  . insulin aspart  4 Units Subcutaneous TID WC  . insulin glargine  14 Units Subcutaneous Daily  . levETIRAcetam  500 mg Oral BID  . senna-docusate  1 tablet Oral BID  . umeclidinium-vilanterol  1 puff Inhalation Daily  . warfarin  5 mg Oral ONCE-1800  . Warfarin - Pharmacist Dosing Inpatient   Does not apply [place]  01/02/2020, 12:49 PM

## 2020-01-02 NOTE — Plan of Care (Signed)
  Problem: Activity: Goal: Risk for activity intolerance will decrease Outcome: Progressing   

## 2020-01-02 NOTE — Progress Notes (Signed)
Initial Nutrition Assessment  DOCUMENTATION CODES:   Not applicable  INTERVENTION:   Change supplement to Nepro Shake po TID, each supplement provides 425 kcal and 19 grams protein  Add Rena-Vit daily at bedtime (Renal MVI)  Provide diet education as needed  NUTRITION DIAGNOSIS:   Inadequate oral intake related to inability to eat as evidenced by NPO status.  Being addressed via supplements  GOAL:   Patient will meet greater than or equal to 90% of their needs  Progressing  MONITOR:   PO intake, Supplement acceptance, Labs, Weight trends  REASON FOR ASSESSMENT:   Consult Enteral/tube feeding initiation and management  ASSESSMENT:   69 yo male admitted with abdominal pain, AKI, hyperkalemia. PMH includes HTN, cardiomyopathy, heavy smoking, COPD on home oxygen, severe PVD, insulin dependent diabetes.  Last iHD yesterday 3/08; no signs of renal recovery, continuing to monitor. Plan for access and CLIP if not improvement Pt reports he feels HD is going well   Ate 100% at breakfast this AM, recorded po intake 50-100%. Pt reports appetite is good; eating lunch on visit today and has eaten almost 100%. Pt with Boost Breeze on tray table (order for Ensure Enlive); pt reports he likes all the supplements, "juice" ones and "milky" ones. Pt also reports he likes Butter Pecan Nepro.   Current weight 73.2 kg post iHD on 3/8; pre iHD weight of 74.2 kg Noted UF of 2.5 L yesterday on iHD Lowest wt  Reinforced importance of adequate nutrition, in particular focusing on adequate protein intake. Pt verbalized understanding. Plan to follow-yp and provide further diet education based on dialysis plan  Labs: phosphorus 4.3 (wdl), potassium 4.7 (wdl) Meds: ss novolog, novolog q 4 hours, lantus, aranesp  Diet Order:   Diet Order            Diet renal with fluid restriction Fluid restriction: 1200 mL Fluid; Room service appropriate? Yes; Fluid consistency: Thin  Diet effective now              EDUCATION NEEDS:   Education needs have been addressed  Skin:  Skin Assessment: Reviewed RN Assessment  Last BM:  3/8  Height:   Ht Readings from Last 1 Encounters:  12/24/19 5\' 11"  (1.803 m)    Weight:   Wt Readings from Last 1 Encounters:  01/01/20 73.2 kg    Ideal Body Weight:  78.2 kg  BMI:  Body mass index is 22.51 kg/m.  Estimated Nutritional Needs:   Kcal:  2000-2200  Protein:  90-110 gm  Fluid:  1 L + UOP   Kerman Passey MS, RDN, LDN, CNSC RD Pager Number and Weekend/On-Call After Hours Pager Located in [place]

## 2020-01-02 NOTE — Plan of Care (Signed)
  Problem: Activity: Goal: Risk for activity intolerance will decrease Outcome: Progressing   Problem: Elimination: Goal: Will not experience complications related to bowel motility Outcome: Progressing   

## 2020-01-02 NOTE — Progress Notes (Signed)
Physical Therapy Treatment Patient Details Name: Kyle [name] MRN: [id number] DOB: 1950/11/26 Today's Date: 01/02/2020    History of Present Illness 69 y.o. 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 Has COPD on 4L home     PT Comments    Pt was seen for gait with more controlled sats while titrating O2.  Nursing in to observe and ask about his tolerance, and discussed resting O2 rates down to 5L today.  Follow up to increase his endurance and balance as he still plans to get home with his wife.  Pt tolerated well and left up in the chair.   Follow Up Recommendations  Home health PT;Supervision for mobility/OOB     Equipment Recommendations  Rolling walker with 5" wheels    Recommendations for Other Services       Precautions / Restrictions Precautions Precautions: Fall Precaution Comments: monitor for impulsivity Restrictions Weight Bearing Restrictions: No    Mobility  Bed Mobility Overal bed mobility: Needs Assistance Bed Mobility: Supine to Sit     Supine to sit: Supervision;Min guard     General bed mobility comments: cued for assistance to use safe transition with min guard to supevision  Transfers Overall transfer level: Needs assistance Equipment used: Rolling walker (2 wheeled);1 person hand held assist Transfers: Sit to/from Stand   Stand pivot transfers: Min assist       General transfer comment: reminders for sequence and safety  Ambulation/Gait Ambulation/Gait assistance: Min guard Gait Distance (Feet): 175 [id number]) Assistive device: Rolling walker (2 wheeled);1 person hand held assist Gait Pattern/deviations: Step-through pattern;Decreased stride length;Wide base of support;Drifts right/left Gait velocity: reduced Gait velocity interpretation: <1.31 ft/sec, indicative of household ambulator General Gait  Details: sats were 90% on 6L O2 after being 85% on wall setting   Stairs             Wheelchair Mobility    Modified Rankin (Stroke Patients Only)       Balance Overall balance assessment: Needs assistance Sitting-balance support: Feet supported Sitting balance-Leahy Scale: Good     Standing balance support: Bilateral upper extremity supported;During functional activity Standing balance-Leahy Scale: Fair Standing balance comment: walker steadying for low endurance                            Cognition Arousal/Alertness: Awake/alert Behavior During Therapy: WFL for tasks assessed/performed Overall Cognitive Status: Within Functional Limits for tasks assessed                                        Exercises      General Comments General comments (skin integrity, edema, etc.): pt was down at 90% sitting, after walking in room could not get a reading and then was 99%, after hallway walk was 96% on 6L.  Reduced to 5L at rest      Pertinent Vitals/Pain Pain Assessment: No/denies pain    Home Living                      Prior Function            PT Goals (current goals can now be found in the care plan section) Acute Rehab PT Goals  Patient Stated Goal: go home Progress towards PT goals: Progressing toward goals    Frequency    Min 3X/week      PT Plan Current plan remains appropriate    Co-evaluation              AM-PAC PT "6 Clicks" Mobility   Outcome Measure  Help needed turning from your back to your side while in a flat bed without using bedrails?: None Help needed moving from lying on your back to sitting on the side of a flat bed without using bedrails?: A Little Help needed moving to and from a bed to a chair (including a wheelchair)?: A Little Help needed standing up from a chair using your arms (e.g., wheelchair or bedside chair)?: A Little Help needed to walk in hospital room?: A Little Help  needed climbing 3-5 steps with a railing? : A Lot 6 Click Score: 18    End of Session Equipment Utilized During Treatment: Oxygen;Gait belt Activity Tolerance: Treatment limited secondary to medical complications (Comment) Patient left: in chair;with call bell/phone within reach;with chair alarm set Nurse Communication: Mobility status PT Visit Diagnosis: Unsteadiness on feet (R26.81);Muscle weakness (generalized) (M62.81);Difficulty in walking, not elsewhere classified (R26.2)     Time: 7425-9563 PT Time Calculation (min) (ACUTE ONLY): 28 min  Charges:  $Gait Training: 8-22 mins $Therapeutic Activity: 8-22 mins                 Ramond Dial 01/02/2020, 3:34 PM   Mee Hives, PT MS Acute Rehab Dept. Number: [place] [facility] [place]

## 2020-01-03 LAB — RENAL FUNCTION PANEL
Albumin: 2.4 g/dL — ABNORMAL LOW (ref 3.5–5.0)
Anion gap: 12 (ref 5–15)
BUN: 72 mg/dL — ABNORMAL HIGH (ref 8–23)
CO2: 24 mmol/L (ref 22–32)
Calcium: 8.3 mg/dL — ABNORMAL LOW (ref 8.9–10.3)
Chloride: 100 mmol/L (ref 98–111)
Creatinine, Ser: 6.75 mg/dL — ABNORMAL HIGH (ref 0.61–1.24)
GFR calc Af Amer: 9 mL/min — ABNORMAL LOW (ref >60)
GFR calc non Af Amer: 8 mL/min — ABNORMAL LOW (ref >60)
Glucose, Bld: 99 mg/dL (ref 70–99)
Phosphorus: 4.1 mg/dL (ref 2.5–4.6)
Potassium: 3.7 mmol/L (ref 3.5–5.1)
Sodium: 136 mmol/L (ref 135–145)

## 2020-01-03 LAB — GLUCOSE, CAPILLARY
Glucose-Capillary: 74 mg/dL (ref 70–99)
Glucose-Capillary: 78 mg/dL (ref 70–99)
Glucose-Capillary: 131 mg/dL — ABNORMAL HIGH (ref 70–99)
Glucose-Capillary: 117 mg/dL — ABNORMAL HIGH (ref 70–99)

## 2020-01-03 LAB — CBC
HCT: 28.5 % — ABNORMAL LOW (ref 39.0–52.0)
Hemoglobin: 9.5 g/dL — ABNORMAL LOW (ref 13.0–17.0)
MCH: 29.7 pg (ref 26.0–34.0)
MCHC: 33.3 g/dL (ref 30.0–36.0)
MCV: 89.1 fL (ref 80.0–100.0)
Platelets: 245 10*3/uL (ref 150–400)
RBC: 3.2 MIL/uL — ABNORMAL LOW (ref 4.22–5.81)
RDW: 15.1 % (ref 11.5–15.5)
WBC: 11 10*3/uL — ABNORMAL HIGH (ref 4.0–10.5)
nRBC: 0.5 % — ABNORMAL HIGH (ref 0.0–0.2)

## 2020-01-03 LAB — PROTIME-INR
INR: 1.7 — ABNORMAL HIGH (ref 0.8–1.2)
Prothrombin Time: 19.5 seconds — ABNORMAL HIGH (ref 11.4–15.2)

## 2020-01-03 MED ORDER — WARFARIN SODIUM 2.5 MG PO TABS
2.5000 mg | ORAL_TABLET | Freq: Once | ORAL | Status: AC
  Administered 2020-01-03: 2.5 mg via ORAL
  Filled 2020-01-03: qty 1

## 2020-01-03 NOTE — Plan of Care (Signed)
  Problem: Safety: Goal: Ability to remain free from injury will improve Outcome: Progressing   

## 2020-01-03 NOTE — Progress Notes (Signed)
[facility] Progress Note   69 y.o.year-old with h/o pulm HTN, PAD (sp aorto-bifem bypass '16), HTN, DCM EF 20-25% (remote echo), COPD- chronically on 4 L  presented w/ abd pain , nausea/ vomiting and diarrhea x1wk + mid upper abd pain.  In ED labs showed creat 14, K 6.3, on lasix and ace inhibitor at home.  Asked to see for AKI; cr 1.47 on 2/4/21w/ BLcr from Jun 2020-feb 2021 1.4- 1.7.   Assessment/ Plan:   AoCKD 3 - in setting of vol depletion/ vomiting/ diarrhea and ACEi. B/l creat is 1.5.urine shows 100 of protein, few RBC -  HD started on 2/28- last on Mon 3/8 - (VIR) placed RIJ TC;   - Last  HD 3/8 - BUN and crt increasing but UOP seems to be increasing as well-  since crt so good 5 weeks ago need to watch a little longer- daily labs- no needs for HD today.    if no signs of recovery will need access and CLIP- a little later this week- still hopeful for possible recovery 2. DM on insulin 3. Vol depletion - treated-  BP in the 150's-  norvasc 5 and coreg 6.25 BID 4. Lactic / metabolic acidosis - severebut now resolved.  5. COPD - on homeO2. 6. Anemia-  iron stores low, gave feraheme 3/9 - started ESA 7. Bones-  Phos OK- check PTH (pending)   Subjective:   No c/o's-  Looks like UOP is increased- still has foley   Objective:   BP (!) 150/66 (BP Location: Left Arm)   Pulse 72   Temp 97.6 F (36.4 C) (Oral)   Resp 20   Ht 5\' 11"  (1.803 m)   Wt 73.2 kg   SpO2 96%   BMI 22.51 kg/m   Intake/Output Summary (Last 24 hours) at 01/03/2020 1136 Last data filed at 01/03/2020 0900 Gross per 24 hour  Intake 680 ml  Output 900 ml  Net -220 ml   Weight change:   Physical Exam: Genin bedconverses, very pleasant No rash, cyanosis or gangrene No jvd or bruits Chest clear bilatto bases, no rales, +rhonchi RRR no MRG Abd soft ntnd no massor ascites ExttrLEedema, no wounds or ulcers Access: RIJtemp- in place for 4 days  GU: foley to  gravity  Imaging: No results found.  Labs: BMET Recent Labs  Lab 12/28/19 1113 12/29/19 0515 12/30/19 0417 12/31/19 0600 01/01/20 0420 01/02/20 0518 01/03/20 0431  NA 137 136 138 136 136 139 136  K 3.8 4.5 3.6 3.5 3.9 4.7 3.7  CL 99 98 101 100 99 103 100  CO2 27 24 25 22 22  21* 24  GLUCOSE 156* 236* 117* 101* 160* 150* 99  BUN 66* 84* 48* 74* 101* 50* 72*  CREATININE 6.85* 7.78* 4.97* 6.63* 8.56* 5.37* 6.75*  CALCIUM 7.6* 7.9* 7.6* 7.8* 8.2* 8.7* 8.3*  PHOS 2.8  --  3.4 4.1 3.9 4.3 4.1   CBC Recent Labs  Lab 12/31/19 0600 01/01/20 0420 01/02/20 0518 01/03/20 0431  WBC 12.7* 14.0* 11.2* 11.0*  HGB 9.3* 9.0* 9.8* 9.5*  HCT 27.0* 26.6* 29.4* 28.5*  MCV 85.2 86.6 88.0 89.1  PLT 168 181 210 245    Medications:    . amLODipine  5 mg Oral Daily  . carvedilol  6.25 mg Oral BID WC  . Chlorhexidine Gluconate Cloth  6 each Topical Daily  . coumadin book   Does not apply Once  . darbepoetin (ARANESP) injection - NON-DIALYSIS  60 mcg  Subcutaneous Q Mon-1800  . feeding supplement (NEPRO CARB STEADY)  237 mL Oral TID BM  . guaiFENesin  600 mg Oral BID  . insulin aspart  0-5 Units Subcutaneous QHS  . insulin aspart  0-9 Units Subcutaneous TID WC  . insulin aspart  4 Units Subcutaneous TID WC  . insulin glargine  14 Units Subcutaneous Daily  . levETIRAcetam  500 mg Oral BID  . multivitamin  1 tablet Oral QHS  . senna-docusate  1 tablet Oral BID  . umeclidinium-vilanterol  1 puff Inhalation Daily  . warfarin  2.5 mg Oral ONCE-1800  . Warfarin - Pharmacist Dosing Inpatient   Does not apply [place]  01/03/2020, 11:36 AM

## 2020-01-03 NOTE — Progress Notes (Signed)
Pt c/o pain and discomfort in LUE. Pt has some non-pitting edema in that extremity, especially in the elbow area. On provider Baltazar Najjar, NP made aware. Call light with in reach.

## 2020-01-03 NOTE — Progress Notes (Signed)
Occupational Therapy Treatment Patient Details Name: Kyle [name] MRN: [id number] DOB: [birth year] Today's Date: 01/03/2020    History of present illness 69 y.o. 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 Has COPD on 4L home    OT comments  Pt making good progress with functional goals. Session focused on bathing (simulated), dressing, sit - stand for mobility using RW, toilet transfers, toileting and hygiene at sink. Pt required cues for safety during ADL mobility. Pt very pleasant and cooperative and OT will continue to follow acutely  Follow Up Recommendations  Home health OT;Supervision - Intermittent    Equipment Recommendations  Tub/shower bench    Recommendations for Other Services      Precautions / Restrictions Precautions Precautions: Fall Precaution Comments: monitor for impulsivity Restrictions Weight Bearing Restrictions: No       Mobility Bed Mobility Overal bed mobility: Needs Assistance Bed Mobility: Supine to Sit;Sit to Supine     Supine to sit: Supervision Sit to supine: Supervision      Transfers Overall transfer level: Needs assistance Equipment used: Rolling walker (2 wheeled);1 person hand held assist Transfers: Sit to/from Stand Sit to Stand: Min assist Stand pivot transfers: Min guard       General transfer comment: cues for safety    Balance Overall balance assessment: Needs assistance Sitting-balance support: Feet supported Sitting balance-Leahy Scale: Good     Standing balance support: Bilateral upper extremity supported;During functional activity Standing balance-Leahy Scale: Fair                             ADL either performed or assessed with clinical judgement   ADL Overall ADL's : Needs assistance/impaired     Grooming: Wash/dry hands;Wash/dry face;Standing;Min guard    Upper Body Bathing: Set up;Supervision/ safety;Sitting Upper Body Bathing Details (indicate cue type and reason): simulated Lower Body Bathing: Min guard;Sit to/from stand Lower Body Bathing Details (indicate cue type and reason): simulated Upper Body Dressing : Set up;Supervision/safety   Lower Body Dressing: Min guard;Sit to/from stand   Toilet Transfer: Min guard;Ambulation;RW;Comfort height toilet;Grab bars;Cueing for safety   Toileting- Clothing Manipulation and Hygiene: Min guard;Sit to/from stand       Functional mobility during ADLs: Min guard;Rolling walker;Cueing for safety       Vision Baseline Vision/History: Wears glasses Wears Glasses: Reading only Patient Visual Report: No change from baseline     Perception     Praxis      Cognition Arousal/Alertness: Awake/alert Behavior During Therapy: WFL for tasks assessed/performed Overall Cognitive Status: Within Functional Limits for tasks assessed                                          Exercises     Shoulder Instructions       General Comments      Pertinent Vitals/ Pain       Pain Assessment: No/denies pain  Home Living                                          Prior Functioning/Environment  Frequency  Min 2X/week        Progress Toward Goals  OT Goals(current goals can now be found in the care plan section)  Progress towards OT goals: Progressing toward goals  Acute Rehab OT Goals Patient Stated Goal: go home  Plan Discharge plan remains appropriate    Co-evaluation                 AM-PAC OT "6 Clicks" Daily Activity     Outcome Measure   Help from another person eating meals?: None Help from another person taking care of personal grooming?: A Little Help from another person toileting, which includes using toliet, bedpan, or urinal?: A Little Help from another person bathing (including washing, rinsing, drying)?: A  Little Help from another person to put on and taking off regular upper body clothing?: None Help from another person to put on and taking off regular lower body clothing?: A Little 6 Click Score: 20    End of Session Equipment Utilized During Treatment: Rolling walker;Gait belt;Other (comment)(3 in 1)  OT Visit Diagnosis: Unsteadiness on feet (R26.81)   Activity Tolerance Patient tolerated treatment well   Patient Left in bed;with call bell/phone within reach;with bed alarm set   Nurse Communication          Time: 2836-6294 OT Time Calculation (min): 26 min  Charges: OT General Charges $OT Visit: 1 Visit OT Treatments $Self Care/Home Management : 8-22 mins $Therapeutic Activity: 8-22 mins     Britt Bottom 01/03/2020, 2:06 PM

## 2020-01-03 NOTE — Progress Notes (Signed)
PROGRESS NOTE    Kyle [name]  VFI:[id number] DOB: 04/24/1951 DOA: 12/24/2019 PCP: Alonna Buckler, MD   Brief Narrative: As pr HPI:  75 yom history of heavy smoking, COPD, peripheral vascular disease presenting with nausea, intermittent vomiting, abd cramping/pain and diarrhea for 1 week found to have acute renal failure, hyperkalemia and severe lactic acidosis of unclear etiology. Patient debated on 12/25/2019 and extubated on 12/26/2019.  Patient back on baseline O2 and following commands appropriately.  He is deconditioned weak and frail.  Subjective: Seen and examined this morning. Afebrile overnight. k 3.7,bun/cr- 72/5.3 from  50/5.3. last HD 3/8 Seen and examined- Lives with wife foley cath +, ON 4l [place]    Assessment & Plan:  Acute renal failure on CKD stage IIIa in the setting of volume depletion/vomiting diarrhea, ACE inhibitor.  Baseline creatinine 1.5.  Seen by nephrology placed on HD 2/28 last HD on 3/8.  Right IJ TDC present.  BUN/creatinine increasing but urine output also increasing, per nephrology will need to monitor with daily labs and no needs for HD currently.  If no signs of recovery he will need access and clip a little later this week but is still hopeful for possible renal recovery.  Acute metabolic encephalopathy;?  Etiology likely secondary to seizure in the setting of significant uremia.  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  Had ammonia level high at 56.  Initially needed intubation subsequently extubated 3/2, CT head and MRI head no acute intracranial finding.  EEG was negative.  Seen by neurology, on Keppra twice daily.  Continue on supportive care.  He is alert awake and oriented.  Hypertension: Blood pressure fairly stable continue Norvasc and Coreg.  PAF/dilated cardiomyopathy/aortic stenosis/peripheral vascular disease: Patient has been lost to follow-up with cardiology in the past, with medication noncompliance.  Patient is reportedly was compliant with his  medication at home recently.  On apixaban prior to admission not transition to Coumadin.  Continue to monitor INR and pharmacy dosing.  Continue Coreg.  Hyperkalemia resolved. Lactic acidosis/metabolic acidosis severe but resolved currently.  Vomiting diarrhea and abdominal pain/ volume depletion treated, currently stable.  CT abdomen and pelvis with renal stone protocol no source of patient's abdominal pain.  Overall symptoms have resolved.  PCCM.  Could be possibly from ischemic colitis.  Chronic hypoxic respiratory failure/COPD on home oxygen 4 to 5 L nasal cannula.  Continue the same.  Continue pulmonary support.  Coffee-ground emesis H&H stable continue PPI. Hb 9.5 g today.  Anemia with low iron storage:gave Feraheme 3/9 and also started on ESA.  Appreciate nephrology input.  Right lower lung nodule 5 mm repeat CT scan in 12 months will be needed.  Follow-up with pulmonary outpatient.  Insulin-dependent diabetes mellitus A1c 8.0.  Blood sugar overall stable.  Continue Lantus 40 units daily and sliding scale insulin, NovoLog premeal.  Nutrition: Nutrition Problem: Inadequate oral intake Etiology: inability to eat Signs/Symptoms: NPO status Interventions: MVI, Nepro shake Body mass index is 22.51 kg/m.  Pressure Ulcer:    DVT prophylaxis:warfarin Code Status:FULL Family Communication: plan of care discussed with patient at bedside. Disposition Plan: Patient is from:home Anticipated Disposition: to HHPT/Supervision for mobility Barriers to discharge or conditions that needs to be met prior to discharge:Home when clinically improved, cleared by nephrology.  If no significant improvement with renal function could likely begin CLIP process later this week per nephrology.   Consultants:   Interventional radiology: Dr. Earleen Newport  Neurology: Dr. Cheral Marker 12/24/2019  Nephrology: Dr. Jonnie Finner 12/24/2019  [facility] hospitalist: Dr. Lorin Mercy 12/24/2019  Procedures:   CT renal stone protocol  12/24/2019  Chest x-ray 12/24/2019, 12/25/2019  MRI brain MRA head 12/24/2019  Tunneled central venous HD catheter placement per IR 12/28/2019  Renal ultrasound 12/24/2019  EEG 12/24/2019--- suggestive of moderate to severe diffuse encephalopathy, nonspecific etiology.  Right IJ HD cath 12/24/2019  ETT 12/25/2019>>>> 12/26/2019  Microbiology:see note  Medications: Scheduled Meds: . amLODipine  5 mg Oral Daily  . carvedilol  6.25 mg Oral BID WC  . Chlorhexidine Gluconate Cloth  6 each Topical Daily  . coumadin book   Does not apply Once  . darbepoetin (ARANESP) injection - NON-DIALYSIS  60 mcg Subcutaneous Q Mon-1800  . feeding supplement (NEPRO CARB STEADY)  237 mL Oral TID BM  . guaiFENesin  600 mg Oral BID  . insulin aspart  0-5 Units Subcutaneous QHS  . insulin aspart  0-9 Units Subcutaneous TID WC  . insulin aspart  4 Units Subcutaneous TID WC  . insulin glargine  14 Units Subcutaneous Daily  . levETIRAcetam  500 mg Oral BID  . multivitamin  1 tablet Oral QHS  . senna-docusate  1 tablet Oral BID  . umeclidinium-vilanterol  1 puff Inhalation Daily  . Warfarin - Pharmacist Dosing Inpatient   Does not apply q1800   Continuous Infusions: . sodium chloride 250 mL (12/29/19 0532)    Antimicrobials: Anti-infectives (From admission, onward)   Start     Dose/Rate Route Frequency Ordered Stop   12/28/19 1400  ceFAZolin (ANCEF) IVPB 2g/100 mL premix     2 g 200 mL/hr over 30 Minutes Intravenous  Once 12/28/19 1347 12/28/19 1533       Objective: Vitals: Today's Vitals   01/02/20 2020 01/02/20 2029 01/03/20 0532 01/03/20 0800  BP:  (!) 144/77 (!) 153/72 (!) 150/66  Pulse:  71 65 72  Resp:  '18 18 20  '$ Temp:  98.1 F (36.7 C) 98.1 F (36.7 C) 97.6 F (36.4 C)  TempSrc:  Oral Oral Oral  SpO2:  91% 90% (!) 89%  Weight:      Height:      PainSc: 0-No pain   7     Intake/Output Summary (Last 24 hours) at 01/03/2020 0849 Last data filed at 01/03/2020 0830 Gross per 24 hour   Intake 680 ml  Output 575 ml  Net 105 ml   Filed Weights   01/01/20 0500 01/01/20 1258 01/01/20 1700  Weight: 74.1 kg 74.2 kg 73.2 kg   Weight change:    Intake/Output from previous day: 03/09 0701 - 03/10 0700 In: 680 [P.O.:680] Out: 250 [Urine:250] Intake/Output this shift: Total I/O In: -  Out: 325 [Urine:325]  Examination:  General exam: AAOX3 ,NAD, weak appearing. HEENT:Oral mucosa moist, Ear/Nose WNL grossly,dentition normal. Respiratory system: bilaterally diminished,no wheezing or crackles,no use of accessory muscle, non tender. Cardiovascular system: S1 & S2 +, regular, No JVD. Gastrointestinal system: Abdomen soft, NT,ND, BS+. Nervous System:Alert, awake, moving extremities and grossly nonfocal Extremities: No edema, distal peripheral pulses palpable.  Skin: No rashes,no icterus. MSK: Normal muscle bulk,tone, power RT CHEST W/ HD catheter+  Data Reviewed: I have personally reviewed following labs and imaging studies CBC: Recent Labs  Lab 12/30/19 0417 12/31/19 0600 01/01/20 0420 01/02/20 0518 01/03/20 0431  WBC 12.9* 12.7* 14.0* 11.2* 11.0*  HGB 9.7* 9.3* 9.0* 9.8* 9.5*  HCT 28.6* 27.0* 26.6* 29.4* 28.5*  MCV 85.6 85.2 86.6 88.0 89.1  PLT 132* 168 181 210 010   Basic Metabolic Panel: Recent  Labs  Lab 12/28/19 1113 12/29/19 0515 12/30/19 0417 12/31/19 0600 01/01/20 0420 01/02/20 0518 01/03/20 0431  NA 137   < > 138 136 136 139 136  K 3.8   < > 3.6 3.5 3.9 4.7 3.7  CL 99   < > 101 100 99 103 100  CO2 27   < > '25 22 22 '$ 21* 24  GLUCOSE 156*   < > 117* 101* 160* 150* 99  BUN 66*   < > 48* 74* 101* 50* 72*  CREATININE 6.85*   < > 4.97* 6.63* 8.56* 5.37* 6.75*  CALCIUM 7.6*   < > 7.6* 7.8* 8.2* 8.7* 8.3*  MG 1.9  --   --   --   --   --   --   PHOS 2.8  --  3.4 4.1 3.9 4.3 4.1   < > = values in this interval not displayed.   GFR: Estimated Creatinine Clearance: 11 mL/min (A) (by C-G formula based on SCr of 6.75 mg/dL (H)). Liver Function  Tests: Recent Labs  Lab 12/30/19 0417 12/31/19 0600 01/01/20 0420 01/02/20 0518 01/03/20 0431  ALBUMIN 2.1* 2.2* 2.2* 2.5* 2.4*   No results for input(s): LIPASE, AMYLASE in the last 168 hours. No results for input(s): AMMONIA in the last 168 hours. Coagulation Profile: Recent Labs  Lab 12/31/19 0600 12/31/19 0935 01/01/20 0420 01/02/20 0518 01/03/20 0431  INR 2.2* 2.5* 3.0* 1.5* 1.7*   Cardiac Enzymes: No results for input(s): CKTOTAL, CKMB, CKMBINDEX, TROPONINI in the last 168 hours. BNP (last 3 results) No results for input(s): PROBNP in the last 8760 hours. HbA1C: No results for input(s): HGBA1C in the last 72 hours. CBG: Recent Labs  Lab 01/02/20 0650 01/02/20 1130 01/02/20 1619 01/02/20 2029 01/03/20 0732  GLUCAP 135* 249* 175* 132* 74   Lipid Profile: No results for input(s): CHOL, HDL, LDLCALC, TRIG, CHOLHDL, LDLDIRECT in the last 72 hours. Thyroid Function Tests: No results for input(s): TSH, T4TOTAL, FREET4, T3FREE, THYROIDAB in the last 72 hours. Anemia Panel: Recent Labs    01/02/20 0518  FERRITIN 141  TIBC 269  IRON 22*   Sepsis Labs: No results for input(s): PROCALCITON, LATICACIDVEN in the last 168 hours.  Recent Results (from the past 240 hour(s))  MRSA PCR Screening     Status: None   Collection Time: 12/24/19 11:54 AM   Specimen: Nasal Mucosa; Nasopharyngeal  Result Value Ref Range Status   MRSA by PCR NEGATIVE NEGATIVE Final    Comment:        The GeneXpert MRSA Assay (FDA approved for NASAL specimens only), is one component of a comprehensive MRSA colonization surveillance program. It is not intended to diagnose MRSA infection nor to guide or monitor treatment for MRSA infections. Performed at [facility], [place] [address] [place], [place] [address]       Radiology Studies: No results found.   LOS: 10 days   Time spent: More than 50% of that time was spent in counseling and/or coordination of care.  Antonieta Pert,  MD [facility]  01/03/2020, 8:49 AM

## 2020-01-03 NOTE — Progress Notes (Signed)
ANTICOAGULATION CONSULT NOTE - Follow-Up  Pharmacy Consult for warfarin Indication: atrial fibrillation  No Known Allergies  Patient Measurements: Height: 5\' 11"  (180.3 cm) Weight: 161 lb 6 oz (73.2 kg) IBW/kg (Calculated) : 75.3  Heparin Dosing Weight: 70.6 kg  Vital Signs: Temp: 97.6 F (36.4 C) (03/10 0800) Temp Source: Oral (03/10 0800) BP: 150/66 (03/10 0800) Pulse Rate: 72 (03/10 0800)  Labs: Recent Labs    01/01/20 0420 01/01/20 0420 01/02/20 0518 01/03/20 0431  HGB 9.0*   < > 9.8* 9.5*  HCT 26.6*  --  29.4* 28.5*  PLT 181  --  210 245  LABPROT 31.1*  --  17.9* 19.5*  INR 3.0*  --  1.5* 1.7*  CREATININE 8.56*  --  5.37* 6.75*   < > = values in this interval not displayed.    Estimated Creatinine Clearance: 11 mL/min (A) (by C-G formula based on SCr of 6.75 mg/dL (H)).  Assessment: 69 yr old male presented with N/V/D X 1 week, found to have acute renal failure. Pt has hx of a fib, for which he was taking apixaban PTA (last dose on 2/27, due to concern for coffee ground emesis, which has resolved; pt was on SQ heparin). Pharmacy was consulted to dose warfarin for Afib.  INR was noted to have a quick rise on 3/7 after only 2 doses. Warfarin was held 3/7 + 3/8 evening and resumed with 5 mg x 1 on 3/9.   INR today remain SUBtherapeutic though trending back up (INR 1.7 << 1.5, goal of 2-3). Hgb 9.5, plts wnl  Goal of Therapy:  INR 2-3 Monitor platelets by anticoagulation protocol: Yes   Plan:  - Warfarin 2.5 mg x 1 dose at 1800 today - Daily PT/INR, CBC q72h - Will continue to monitor for any signs/symptoms of bleeding and will follow up with PT/INR in the a.m.    Thank you for allowing pharmacy to be a part of this patient's care.  Alycia Rossetti, PharmD, BCPS Clinical Pharmacist Clinical phone for 01/03/2020: [phone] 01/03/2020 10:12 AM   **Pharmacist phone directory can now be found on [email or web address] ([email or web address]).  Listed under [place].

## 2020-01-04 ENCOUNTER — Inpatient Hospital Stay (HOSPITAL_COMMUNITY): Payer: Medicare HMO

## 2020-01-04 LAB — RENAL FUNCTION PANEL
Albumin: 2.1 g/dL — ABNORMAL LOW (ref 3.5–5.0)
Anion gap: 14 (ref 5–15)
BUN: 87 mg/dL — ABNORMAL HIGH (ref 8–23)
CO2: 20 mmol/L — ABNORMAL LOW (ref 22–32)
Calcium: 8.5 mg/dL — ABNORMAL LOW (ref 8.9–10.3)
Chloride: 102 mmol/L (ref 98–111)
Creatinine, Ser: 8.39 mg/dL — ABNORMAL HIGH (ref 0.61–1.24)
GFR calc Af Amer: 7 mL/min — ABNORMAL LOW (ref >60)
GFR calc non Af Amer: 6 mL/min — ABNORMAL LOW (ref >60)
Glucose, Bld: 121 mg/dL — ABNORMAL HIGH (ref 70–99)
Phosphorus: 5 mg/dL — ABNORMAL HIGH (ref 2.5–4.6)
Potassium: 4.1 mmol/L (ref 3.5–5.1)
Sodium: 136 mmol/L (ref 135–145)

## 2020-01-04 LAB — GLUCOSE, CAPILLARY
Glucose-Capillary: 97 mg/dL (ref 70–99)
Glucose-Capillary: 206 mg/dL — ABNORMAL HIGH (ref 70–99)
Glucose-Capillary: 202 mg/dL — ABNORMAL HIGH (ref 70–99)

## 2020-01-04 LAB — CBC
HCT: 26.2 % — ABNORMAL LOW (ref 39.0–52.0)
Hemoglobin: 8.7 g/dL — ABNORMAL LOW (ref 13.0–17.0)
MCH: 28.9 pg (ref 26.0–34.0)
MCHC: 33.2 g/dL (ref 30.0–36.0)
MCV: 87 fL (ref 80.0–100.0)
Platelets: 204 10*3/uL (ref 150–400)
RBC: 3.01 MIL/uL — ABNORMAL LOW (ref 4.22–5.81)
RDW: 15.4 % (ref 11.5–15.5)
WBC: 13.2 10*3/uL — ABNORMAL HIGH (ref 4.0–10.5)
nRBC: 0.4 % — ABNORMAL HIGH (ref 0.0–0.2)

## 2020-01-04 LAB — PROTIME-INR
INR: 2.2 — ABNORMAL HIGH (ref 0.8–1.2)
Prothrombin Time: 24.4 seconds — ABNORMAL HIGH (ref 11.4–15.2)

## 2020-01-04 LAB — URIC ACID: Uric Acid, Serum: 6.6 mg/dL (ref 3.7–8.6)

## 2020-01-04 MED ORDER — WARFARIN SODIUM 2.5 MG PO TABS
2.5000 mg | ORAL_TABLET | Freq: Once | ORAL | Status: AC
  Administered 2020-01-04: 2.5 mg via ORAL
  Filled 2020-01-04: qty 1

## 2020-01-04 MED ORDER — HYDROCODONE-ACETAMINOPHEN 5-325 MG PO TABS
1.0000 | ORAL_TABLET | Freq: Four times a day (QID) | ORAL | Status: DC | PRN
  Administered 2020-01-04 (×2): 1 via ORAL
  Filled 2020-01-04 (×2): qty 1

## 2020-01-04 MED ORDER — SODIUM CHLORIDE 0.9 % IV SOLN
510.0000 mg | Freq: Once | INTRAVENOUS | Status: AC
  Administered 2020-01-05: 510 mg via INTRAVENOUS
  Filled 2020-01-04: qty 17

## 2020-01-04 NOTE — Progress Notes (Signed)
ANTICOAGULATION CONSULT NOTE - Follow-Up  Pharmacy Consult for warfarin Indication: atrial fibrillation  No Known Allergies  Patient Measurements: Height: 5\' 11"  (180.3 cm) Weight: 161 lb 6.9 oz (73.2 kg) IBW/kg (Calculated) : 75.3  Heparin Dosing Weight: 70.6 kg  Vital Signs: Temp: 98 F (36.7 C) (03/11 0910) Temp Source: Oral (03/11 0910) BP: 137/60 (03/11 0910) Pulse Rate: 78 (03/11 0910)  Labs: Recent Labs    01/02/20 0518 01/02/20 0518 01/03/20 0431 01/04/20 0438  HGB 9.8*   < > 9.5* 8.7*  HCT 29.4*  --  28.5* 26.2*  PLT 210  --  245 204  LABPROT 17.9*  --  19.5* 24.4*  INR 1.5*  --  1.7* 2.2*  CREATININE 5.37*  --  6.75* 8.39*   < > = values in this interval not displayed.    Estimated Creatinine Clearance: 8.8 mL/min (A) (by C-G formula based on SCr of 8.39 mg/dL (H)).  Assessment: 69 yr old male presented with N/V/D X 1 week, found to have acute renal failure. Pt has hx of a fib, for which he was taking apixaban PTA (last dose on 2/27, due to concern for coffee ground emesis, which has resolved; pt was on SQ heparin). Pharmacy was consulted to dose warfarin for Afib.  INR was noted to have a quick rise on 3/7 after only 2 doses. Warfarin was held 3/7 + 3/8 evening and resumed on 3/9 and has received 5 mg, 2.5mg  since then.  INR today is therapeutic (INR 2.2 << 1.7, goal of 2-3). Hgb 8.7, plts wnl  Goal of Therapy:  INR 2-3 Monitor platelets by anticoagulation protocol: Yes   Plan:  - Repeat Warfarin 2.5 mg x 1 dose at 1800 today - Daily PT/INR, CBC q72h - Will continue to monitor for any signs/symptoms of bleeding and will follow up with PT/INR in the a.m.    Thank you for allowing pharmacy to be a part of this patient's care.  Alycia Rossetti, PharmD, BCPS Clinical Pharmacist Clinical phone for 01/04/2020: [phone] 01/04/2020 9:27 AM   **Pharmacist phone directory can now be found on [email or web address] ([email or web address]).  Listed under [place].

## 2020-01-04 NOTE — Plan of Care (Signed)
  Problem: Clinical Measurements: Goal: Diagnostic test results will improve Outcome: Progressing   Problem: Activity: Goal: Risk for activity intolerance will decrease Outcome: Progressing   Problem: Clinical Measurements: Goal: Will remain free from infection Outcome: Adequate for Discharge

## 2020-01-04 NOTE — Plan of Care (Signed)
  Problem: Elimination: Goal: Will not experience complications related to bowel motility Outcome: Progressing   

## 2020-01-04 NOTE — Progress Notes (Signed)
[facility] Progress Note   69 y.o.year-old with h/o pulm HTN, PAD (sp aorto-bifem bypass '16), HTN, DCM EF 20-25% (remote echo), COPD- chronically on 4 L  presented w/ abd pain , nausea/ vomiting and diarrhea x1wk + mid upper abd pain.  In ED labs showed creat 14, K 6.3, on lasix and ace inhibitor at home.  Asked to see for AKI; cr 1.47 on 2/4/21w/ BLcr from Jun 2020-feb 2021 1.4- 1.7.   Assessment/ Plan:   AoCKD 3 - in setting of vol depletion/ vomiting/ diarrhea and ACEi. B/l creat is 1.5.urine shows 100 of protein, few RBC -  HD started on 2/28- last on Mon 3/8 - (VIR) placed RIJ TC;   - Last  HD 3/8 - BUN and crt increasing but UOP seems to be increasing as well-  since crt so good 5 weeks ago need to watch a little longer- daily labs- no needs for HD today.    if no signs of recovery will need access and CLIP- still hopeful for possible recovery 2. DM on insulin 3. Vol depletion - treated-  BP in the 150's-  norvasc 5 and coreg 6.25 BID 4. Lactic / metabolic acidosis - severebut now resolved.  5. COPD - on homeO2. 6. Anemia-  iron stores low, gave feraheme 3/9- will need second dose tomorrow - started ESA 7. Bones-  Phos OK- check PTH   Subjective:   No c/o's-  UOP 950 - is more- BUN and crt up though-  Not uremic   Objective:   BP 137/60 (BP Location: Right Arm)   Pulse 78   Temp 98 F (36.7 C) (Oral)   Resp 18   Ht 5\' 11"  (1.803 m)   Wt 73.2 kg   SpO2 91%   BMI 22.52 kg/m   Intake/Output Summary (Last 24 hours) at 01/04/2020 1107 Last data filed at 01/04/2020 0900 Gross per 24 hour  Intake 740 ml  Output 500 ml  Net 240 ml   Weight change:   Physical Exam: Genin bedconverses, very pleasant No rash, cyanosis or gangrene No jvd or bruits Chest clear bilatto bases, no rales, +rhonchi RRR no MRG Abd soft ntnd no massor ascites ExttrLEedema, no wounds or ulcers Access: RIJtemp- in place for 4 days  GU: foley to  gravity  Imaging: No results found.  Labs: BMET Recent Labs  Lab 12/28/19 1113 12/28/19 1113 12/29/19 0515 12/30/19 0417 12/31/19 0600 01/01/20 0420 01/02/20 0518 01/03/20 0431 01/04/20 0438  NA 137   < > 136 138 136 136 139 136 136  K 3.8   < > 4.5 3.6 3.5 3.9 4.7 3.7 4.1  CL 99   < > 98 101 100 99 103 100 102  CO2 27   < > 24 25 22 22  21* 24 20*  GLUCOSE 156*   < > 236* 117* 101* 160* 150* 99 121*  BUN 66*   < > 84* 48* 74* 101* 50* 72* 87*  CREATININE 6.85*   < > 7.78* 4.97* 6.63* 8.56* 5.37* 6.75* 8.39*  CALCIUM 7.6*   < > 7.9* 7.6* 7.8* 8.2* 8.7* 8.3* 8.5*  PHOS 2.8  --   --  3.4 4.1 3.9 4.3 4.1 5.0*   < > = values in this interval not displayed.   CBC Recent Labs  Lab 01/01/20 0420 01/02/20 0518 01/03/20 0431 01/04/20 0438  WBC 14.0* 11.2* 11.0* 13.2*  HGB 9.0* 9.8* 9.5* 8.7*  HCT 26.6* 29.4* 28.5* 26.2*  MCV 86.6  88.0 89.1 87.0  PLT 181 210 245 204    Medications:    . amLODipine  5 mg Oral Daily  . carvedilol  6.25 mg Oral BID WC  . Chlorhexidine Gluconate Cloth  6 each Topical Daily  . coumadin book   Does not apply Once  . darbepoetin (ARANESP) injection - NON-DIALYSIS  60 mcg Subcutaneous Q Mon-1800  . feeding supplement (NEPRO CARB STEADY)  237 mL Oral TID BM  . guaiFENesin  600 mg Oral BID  . insulin aspart  0-5 Units Subcutaneous QHS  . insulin aspart  0-9 Units Subcutaneous TID WC  . insulin aspart  4 Units Subcutaneous TID WC  . insulin glargine  14 Units Subcutaneous Daily  . levETIRAcetam  500 mg Oral BID  . multivitamin  1 tablet Oral QHS  . senna-docusate  1 tablet Oral BID  . umeclidinium-vilanterol  1 puff Inhalation Daily  . warfarin  2.5 mg Oral ONCE-1800  . Warfarin - Pharmacist Dosing Inpatient   Does not apply [place]  01/04/2020, 11:07 AM

## 2020-01-04 NOTE — Progress Notes (Signed)
PROGRESS NOTE    Kyle [name]  PRF:[id number] DOB: 02-03-1951 DOA: 12/24/2019 PCP: Alonna Buckler, MD   Brief Narrative: As pr HPI:  71 yom history of heavy smoking, COPD, peripheral vascular disease presenting with nausea, intermittent vomiting, abd cramping/pain and diarrhea for 1 week found to have acute renal failure, hyperkalemia and severe lactic acidosis of unclear etiology. Patient debated on 12/25/2019 and extubated on 12/26/2019.  Patient back on baseline O2 and following commands appropriately.  He is deconditioned weak and frail.  Subjective:  Complaining discomfort/pain swelling  on the left upper extremity- left elbow.   Afebrile overnight. Leukocytosis at 13.2 last HD 3/8-  Bun/cr- 87/8.3 increasing. foley cath + UOP 950 ml/24 hr No other complaints    Assessment & Plan:  Acute renal failure on CKD stage IIIa in the setting of volume depletion/vomiting diarrhea, ACE inhibitor.  Baseline creatinine 1.5.  Seen by nephrology placed on HD 2/28 last HD on 3/8.  Right IJ TDC.  BUN/creatinine increasing but with decent urine output- nephrology following and we are continuing to monitor his renal function, and  HD on hold currently.  If no signs of recovery he will need access and clip a little later this week but is still hopeful for possible renal recovery.  Acute metabolic encephalopathy;?  Etiology likely secondary to seizure in the setting of significant uremia.  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  Had ammonia level high at 56.  Initially needed intubation subsequently extubated 3/2, CT head and MRI head no acute intracranial finding.  EEG was negative.  Seen by neurology, placed on Keppra twice daily.  Patient is alert awake oriented.  Mental status has improved.  Continue supportive care.  Left elbow pain/swelling- ?gout w AKI/CKD related- add norco. Check urinc acid, and x-ray of the elbow.  Hypertension: Blood pressure fairly stable continue Norvasc and  Coreg.  PAF/dilated cardiomyopathy/aortic stenosis/peripheral vascular disease: Patient has been lost to follow-up with cardiology in the past, with medication noncompliance.  Patient is reportedly was compliant with his medication at home recently.  On apixaban prior to admission now transitioned to Coumadin the setting of his acute kidney injury.Continue to monitor INR and pharmacy dosing.  Continue Coreg.  Hyperkalemia resolved.  Lactic acidosis/metabolic acidosis severe but resolved currently.  Vomiting diarrhea and abdominal pain/ volume depletion treated, currently stable.  CT abdomen and pelvis with renal stone protocol no source of patient's abdominal pain.  Overall symptoms have resolved.   Could be possibly from ischemic colitis per pccm.  Chronic hypoxic respiratory failure/COPD on home oxygen 4 to 5 L nasal cannula.  Continue the same.  Continue pulmonary support, bronchodilators and ambulation.    Coffee-ground emesis H&H stable continue PPI. Hb 9.5->8.7. monitor  Anemia with low iron storage:gave Feraheme 3/9 and also started on ESA.  Appreciate nephrology input.  Right lower lung nodule 5 mm repeat CT scan in 12 months will be needed.  Follow-up with pulmonary outpatient.  Insulin-dependent diabetes mellitus A1c 8.0.  Blood sugar overall stable.  Continue Lantus 14 units daily, 4 units premeal insulin and sliding scale insulin, monitor. Recent Labs  Lab 01/03/20 0732 01/03/20 1115 01/03/20 1624 01/03/20 2116 01/04/20 0656  GLUCAP 74 78 131* 117* 97    Nutrition: Nutrition Problem: Inadequate oral intake Etiology: inability to eat Signs/Symptoms: NPO status Interventions: MVI, Nepro shake Body mass index is 22.52 kg/m.  Pressure Ulcer:    DVT prophylaxis:warfarin Code Status:FULL Family Communication: plan of care discussed with patient at  bedside. Disposition Plan: Patient is from:home Anticipated Disposition: to HHPT/Supervision for mobility Barriers to  discharge or conditions that needs to be met prior to discharge: Patient remains in the hospital with acute renal failure, monitor renal functions for recovery and diaysis need.Had needed previously last one on 3/8 currently on hold. If no   Renal recovery will need access for dialysis and clip later this week  Consultants:   Interventional radiology: Dr. Earleen Newport  Neurology: Dr. Cheral Marker 12/24/2019  Nephrology: Dr. Jonnie Finner 12/24/2019  [facility] hospitalist: Dr. Lorin Mercy 12/24/2019  Procedures:   CT renal stone protocol 12/24/2019  Chest x-ray 12/24/2019, 12/25/2019  MRI brain MRA head 12/24/2019  Tunneled central venous HD catheter placement per IR 12/28/2019  Renal ultrasound 12/24/2019  EEG 12/24/2019--- suggestive of moderate to severe diffuse encephalopathy, nonspecific etiology.  Right IJ HD cath 12/24/2019  ETT 12/25/2019>>>> 12/26/2019  Microbiology:see note  Medications: Scheduled Meds: . amLODipine  5 mg Oral Daily  . carvedilol  6.25 mg Oral BID WC  . Chlorhexidine Gluconate Cloth  6 each Topical Daily  . coumadin book   Does not apply Once  . darbepoetin (ARANESP) injection - NON-DIALYSIS  60 mcg Subcutaneous Q Mon-1800  . feeding supplement (NEPRO CARB STEADY)  237 mL Oral TID BM  . guaiFENesin  600 mg Oral BID  . insulin aspart  0-5 Units Subcutaneous QHS  . insulin aspart  0-9 Units Subcutaneous TID WC  . insulin aspart  4 Units Subcutaneous TID WC  . insulin glargine  14 Units Subcutaneous Daily  . levETIRAcetam  500 mg Oral BID  . multivitamin  1 tablet Oral QHS  . senna-docusate  1 tablet Oral BID  . umeclidinium-vilanterol  1 puff Inhalation Daily  . Warfarin - Pharmacist Dosing Inpatient   Does not apply q1800   Continuous Infusions: . sodium chloride 250 mL (12/29/19 0532)    Antimicrobials: Anti-infectives (From admission, onward)   Start     Dose/Rate Route Frequency Ordered Stop   12/28/19 1400  ceFAZolin (ANCEF) IVPB 2g/100 mL premix     2 g 200 mL/hr over  30 Minutes Intravenous  Once 12/28/19 1347 12/28/19 1533       Objective: Vitals: Today's Vitals   01/03/20 2006 01/03/20 2118 01/03/20 2142 01/04/20 0458  BP:  (!) 152/71  139/63  Pulse:  85  66  Resp:  18  19  Temp:  98.4 F (36.9 C)  98.4 F (36.9 C)  TempSrc:    Oral  SpO2:  91%  90%  Weight:  73.2 kg    Height:      PainSc: 0-No pain  6      Intake/Output Summary (Last 24 hours) at 01/04/2020 0830 Last data filed at 01/04/2020 0601 Gross per 24 hour  Intake 680 ml  Output 300 ml  Net 380 ml   Filed Weights   01/01/20 1258 01/01/20 1700 01/03/20 2118  Weight: 74.2 kg 73.2 kg 73.2 kg   Weight change:    Intake/Output from previous day: 03/10 0701 - 03/11 0700 In: 680 [P.O.:680] Out: 950 [Urine:950] Intake/Output this shift: No intake/output data recorded.  Examination:  General exam: AAOX3 , thin, not in acute distress, weak appearing.   HEENT:Oral mucosa moist, Ear/Nose WNL grossly,dentition normal. Respiratory system: bilaterally clear,no use of accessory muscle, non tender. Cardiovascular system: S1 & S2 +, regular, No JVD. Gastrointestinal system: Abdomen soft, NT,ND, BS+. Nervous System:Alert, awake, moving extremities and grossly nonfocal Extremities: left elbow area swollen/tender- distal pulses intactNo edema,  distal peripheral pulses palpable.  Skin: No rashes,no icterus. MSK: Normal muscle bulk,tone, power RT CHEST W/ HD catheter+  Data Reviewed: I have personally reviewed following labs and imaging studies CBC: Recent Labs  Lab 12/31/19 0600 01/01/20 0420 01/02/20 0518 01/03/20 0431 01/04/20 0438  WBC 12.7* 14.0* 11.2* 11.0* 13.2*  HGB 9.3* 9.0* 9.8* 9.5* 8.7*  HCT 27.0* 26.6* 29.4* 28.5* 26.2*  MCV 85.2 86.6 88.0 89.1 87.0  PLT 168 181 210 245 507   Basic Metabolic Panel: Recent Labs  Lab 12/28/19 1113 12/29/19 0515 12/31/19 0600 01/01/20 0420 01/02/20 0518 01/03/20 0431 01/04/20 0438  NA 137   < > 136 136 139 136 136  K  3.8   < > 3.5 3.9 4.7 3.7 4.1  CL 99   < > 100 99 103 100 102  CO2 27   < > 22 22 21* 24 20*  GLUCOSE 156*   < > 101* 160* 150* 99 121*  BUN 66*   < > 74* 101* 50* 72* 87*  CREATININE 6.85*   < > 6.63* 8.56* 5.37* 6.75* 8.39*  CALCIUM 7.6*   < > 7.8* 8.2* 8.7* 8.3* 8.5*  MG 1.9  --   --   --   --   --   --   PHOS 2.8   < > 4.1 3.9 4.3 4.1 5.0*   < > = values in this interval not displayed.   GFR: Estimated Creatinine Clearance: 8.8 mL/min (A) (by C-G formula based on SCr of 8.39 mg/dL (H)). Liver Function Tests: Recent Labs  Lab 12/31/19 0600 01/01/20 0420 01/02/20 0518 01/03/20 0431 01/04/20 0438  ALBUMIN 2.2* 2.2* 2.5* 2.4* 2.1*   No results for input(s): LIPASE, AMYLASE in the last 168 hours. No results for input(s): AMMONIA in the last 168 hours. Coagulation Profile: Recent Labs  Lab 12/31/19 0935 01/01/20 0420 01/02/20 0518 01/03/20 0431 01/04/20 0438  INR 2.5* 3.0* 1.5* 1.7* 2.2*   Cardiac Enzymes: No results for input(s): CKTOTAL, CKMB, CKMBINDEX, TROPONINI in the last 168 hours. BNP (last 3 results) No results for input(s): PROBNP in the last 8760 hours. HbA1C: No results for input(s): HGBA1C in the last 72 hours. CBG: Recent Labs  Lab 01/03/20 0732 01/03/20 1115 01/03/20 1624 01/03/20 2116 01/04/20 0656  GLUCAP 74 78 131* 117* 97   Lipid Profile: No results for input(s): CHOL, HDL, LDLCALC, TRIG, CHOLHDL, LDLDIRECT in the last 72 hours. Thyroid Function Tests: No results for input(s): TSH, T4TOTAL, FREET4, T3FREE, THYROIDAB in the last 72 hours. Anemia Panel: Recent Labs    01/02/20 0518  FERRITIN 141  TIBC 269  IRON 22*   Sepsis Labs: No results for input(s): PROCALCITON, LATICACIDVEN in the last 168 hours.  No results found for this or any previous visit (from the past 240 hour(s)).    Radiology Studies: No results found.   LOS: 11 days   Time spent: More than 50% of that time was spent in counseling and/or coordination of  care.  Antonieta Pert, MD [facility]  01/04/2020, 8:30 AM

## 2020-01-04 NOTE — Progress Notes (Addendum)
Physical Therapy Treatment Patient Details Name: Kyle [name] MRN: [id number] DOB: 20-Nov-1950 Today's Date: 01/04/2020    History of Present Illness 69 y.o. M with significant history of heavy smoking, COPD, peripheral vascular disease presenting with nausea, intermittent vomiting, abd cramping/pain and diarrhea for 1 week found to have acute renal failure, hyperkalemia and severe lactic acidosis of unclear etiology. Intubated 12/25/2019-12/26/2019.    PT Comments    Pt motivated to participate with therapy. Has relatively acute onset left elbow pain with noted abscess distal to elbow and tenderness to touch (MD aware). Imaging negative for acute fracture, but pt with noted weakness distal to elbow and decreased functional use of LUE with transitions. Pt declining ice or pain medications from RN when offered. Pt ambulating 120 feet with a walker at a min guard assist level. Continues with decreased endurance and will benefit from HHPT at discharge to address.    Follow Up Recommendations  Home health PT;Supervision/Assistance - 24 hour     Equipment Recommendations  Rolling walker with 5" wheels    Recommendations for Other Services       Precautions / Restrictions Precautions Precautions: Fall Restrictions Weight Bearing Restrictions: No    Mobility  Bed Mobility Overal bed mobility: Needs Assistance Bed Mobility: Supine to Sit     Supine to sit: Min assist     General bed mobility comments: MinA to pull up in bed due to minimal functional use of LUE  Transfers Overall transfer level: Needs assistance Equipment used: Rolling walker (2 wheeled);1 person hand held assist Transfers: Sit to/from Stand   Stand pivot transfers: Min assist       General transfer comment: MinA to boost up to stand from edge of bed  Ambulation/Gait Ambulation/Gait assistance: Min guard Gait Distance (Feet): 120 Feet(60", 60") Assistive device: Rolling walker (2 wheeled);1 person hand held  assist Gait Pattern/deviations: Step-through pattern;Decreased stride length;Wide base of support Gait velocity: reduced   General Gait Details: Requiring min guard for stability, irregular left foot placement. One standing rest break required. DOE 2/4   Marine scientist Rankin (Stroke Patients Only)       Balance Overall balance assessment: Needs assistance Sitting-balance support: Feet supported Sitting balance-Leahy Scale: Good     Standing balance support: Bilateral upper extremity supported;During functional activity Standing balance-Leahy Scale: Fair                              Cognition Arousal/Alertness: Awake/alert Behavior During Therapy: WFL for tasks assessed/performed Overall Cognitive Status: Within Functional Limits for tasks assessed                                        Exercises      General Comments        Pertinent Vitals/Pain Pain Assessment: Faces Faces Pain Scale: Hurts even more Pain Location: L elbow Pain Descriptors / Indicators: Grimacing;Guarding Pain Intervention(s): Limited activity within patient's tolerance;Monitored during session;Other (comment)(pt declining ice or pain medications)    Home Living                      Prior Function            PT Goals (current goals can now be found in  the care plan section) Acute Rehab PT Goals Patient Stated Goal: go home Potential to Achieve Goals: Good Progress towards PT goals: Progressing toward goals    Frequency    Min 3X/week      PT Plan Current plan remains appropriate    Co-evaluation              AM-PAC PT "6 Clicks" Mobility   Outcome Measure  Help needed turning from your back to your side while in a flat bed without using bedrails?: None Help needed moving from lying on your back to sitting on the side of a flat bed without using bedrails?: A Little Help needed moving to and  from a bed to a chair (including a wheelchair)?: A Little Help needed standing up from a chair using your arms (e.g., wheelchair or bedside chair)?: A Little Help needed to walk in hospital room?: A Little Help needed climbing 3-5 steps with a railing? : A Lot 6 Click Score: 18    End of Session Equipment Utilized During Treatment: Oxygen;Gait belt Activity Tolerance: Patient tolerated treatment well Patient left: in chair;with call bell/phone within reach;with chair alarm set Nurse Communication: Mobility status PT Visit Diagnosis: Unsteadiness on feet (R26.81);Muscle weakness (generalized) (M62.81);Difficulty in walking, not elsewhere classified (R26.2)     Time: 8768-1157 PT Time Calculation (min) (ACUTE ONLY): 26 min  Charges:  $Therapeutic Activity: 23-37 mins                       Wyona Almas, PT, DPT Acute Rehabilitation Services Pager [phone] Office [phone]    Deno Etienne 01/04/2020, 5:13 PM

## 2020-01-04 NOTE — Progress Notes (Signed)
Current CBG-78

## 2020-01-05 LAB — PROTIME-INR
INR: 2.3 — ABNORMAL HIGH (ref 0.8–1.2)
Prothrombin Time: 25 seconds — ABNORMAL HIGH (ref 11.4–15.2)

## 2020-01-05 LAB — GLUCOSE, CAPILLARY
Glucose-Capillary: 78 mg/dL (ref 70–99)
Glucose-Capillary: 59 mg/dL — ABNORMAL LOW (ref 70–99)
Glucose-Capillary: 72 mg/dL (ref 70–99)
Glucose-Capillary: 191 mg/dL — ABNORMAL HIGH (ref 70–99)
Glucose-Capillary: 107 mg/dL — ABNORMAL HIGH (ref 70–99)
Glucose-Capillary: 146 mg/dL — ABNORMAL HIGH (ref 70–99)

## 2020-01-05 LAB — CBC
HCT: 26.3 % — ABNORMAL LOW (ref 39.0–52.0)
Hemoglobin: 8.9 g/dL — ABNORMAL LOW (ref 13.0–17.0)
MCH: 29.7 pg (ref 26.0–34.0)
MCHC: 33.8 g/dL (ref 30.0–36.0)
MCV: 87.7 fL (ref 80.0–100.0)
Platelets: 237 10*3/uL (ref 150–400)
RBC: 3 MIL/uL — ABNORMAL LOW (ref 4.22–5.81)
RDW: 15.9 % — ABNORMAL HIGH (ref 11.5–15.5)
WBC: 14.1 10*3/uL — ABNORMAL HIGH (ref 4.0–10.5)
nRBC: 0.1 % (ref 0.0–0.2)

## 2020-01-05 LAB — RENAL FUNCTION PANEL
Albumin: 2.2 g/dL — ABNORMAL LOW (ref 3.5–5.0)
Anion gap: 15 (ref 5–15)
BUN: 99 mg/dL — ABNORMAL HIGH (ref 8–23)
CO2: 20 mmol/L — ABNORMAL LOW (ref 22–32)
Calcium: 8.7 mg/dL — ABNORMAL LOW (ref 8.9–10.3)
Chloride: 102 mmol/L (ref 98–111)
Creatinine, Ser: 9.66 mg/dL — ABNORMAL HIGH (ref 0.61–1.24)
GFR calc Af Amer: 6 mL/min — ABNORMAL LOW (ref >60)
GFR calc non Af Amer: 5 mL/min — ABNORMAL LOW (ref >60)
Glucose, Bld: 77 mg/dL (ref 70–99)
Phosphorus: 5.7 mg/dL — ABNORMAL HIGH (ref 2.5–4.6)
Potassium: 4.2 mmol/L (ref 3.5–5.1)
Sodium: 137 mmol/L (ref 135–145)

## 2020-01-05 MED ORDER — INSULIN GLARGINE 100 UNIT/ML ~~LOC~~ SOLN
8.0000 [IU] | Freq: Every day | SUBCUTANEOUS | Status: DC
  Filled 2020-01-05: qty 0.08

## 2020-01-05 MED ORDER — CHLORHEXIDINE GLUCONATE CLOTH 2 % EX PADS
6.0000 | MEDICATED_PAD | Freq: Every day | CUTANEOUS | Status: DC
  Administered 2020-01-07 – 2020-01-11 (×4): 6 via TOPICAL

## 2020-01-05 MED ORDER — WARFARIN SODIUM 2.5 MG PO TABS
2.5000 mg | ORAL_TABLET | Freq: Once | ORAL | Status: AC
  Administered 2020-01-05: 2.5 mg via ORAL
  Filled 2020-01-05: qty 1

## 2020-01-05 NOTE — Discharge Instructions (Signed)

## 2020-01-05 NOTE — Progress Notes (Signed)
PT Cancellation Note  Patient Details Name: Kyle [name] MRN: [id number] DOB: [birth year]   Cancelled Treatment:    Reason Eval/Treat Not Completed: Patient at procedure or test/unavailable.  In transfusion, will reattempt as time and pt allow.   Ramond Dial 01/05/2020, 3:58 PM   Mee Hives, PT MS Acute Rehab Dept. Number: [place] and [place]

## 2020-01-05 NOTE — Progress Notes (Signed)
Nephrologist/Dr. Moshe Cipro informed Renal Navigator that patient may need OP HD seat for AKI if no recovery seen by Monday. Decision made to start referral and cancel seat if not needed. Renal Navigator met with patient to discuss. He appeared to be struggling to hold back emotion, but did not want to discuss how he is feeling about this other than to say, he'll do whatever is needed/recommended. Referral submitted to [facility] for OP HD treatment for AKI. Renal Navigator will follow up with Nephrologist and [facility] Admissions on Monday.  Alphonzo Cruise, [place] Renal Navigator [phone]

## 2020-01-05 NOTE — Progress Notes (Signed)
Occupational Therapy Treatment Patient Details Name: Kyle [name] MRN: [id number] DOB: March 29, 1951 Today's Date: 01/05/2020    History of present illness 69 y.o. M with significant history of heavy smoking, COPD, peripheral vascular disease presenting with nausea, intermittent vomiting, abd cramping/pain and diarrhea for 1 week found to have acute renal failure, hyperkalemia and severe lactic acidosis of unclear etiology. Intubated 12/25/2019-12/26/2019.   OT comments  Pt continues to be limited by moderate L UE pain and edema. Ambulated about room, to bathroom with hand held assist and completed grooming at sink. Educated in dressing painful arm first and then R. Performed AROM L UE within pt's pain tolerance. Instructed in edema management. Pt not able to tolerate retrograde massage. Refused medication for pain and ice.   Follow Up Recommendations  Home health OT;Supervision - Intermittent    Equipment Recommendations  Tub/shower bench    Recommendations for Other Services      Precautions / Restrictions Precautions Precautions: Fall Restrictions Weight Bearing Restrictions: No       Mobility Bed Mobility               General bed mobility comments: pt received in chair  Transfers Overall transfer level: Needs assistance Equipment used: 1 person hand held assist Transfers: Sit to/from Stand Sit to Stand: Min guard         General transfer comment: from recliner    Balance Overall balance assessment: Needs assistance   Sitting balance-Leahy Scale: Good     Standing balance support: Single extremity supported Standing balance-Leahy Scale: Fair Standing balance comment: static standing at sink without LOB                           ADL either performed or assessed with clinical judgement   ADL Overall ADL's : Needs assistance/impaired Eating/Feeding: Minimal assistance;Sitting Eating/Feeding Details (indicate cue type and reason): assist to open  containers, cut food Grooming: Wash/dry hands;Wash/dry face;Standing;Min guard           Upper Body Dressing : Set up;Supervision/safety Upper Body Dressing Details (indicate cue type and reason): instructed to dress L UE first and then R     Toilet Transfer: Ambulation;Minimal assistance Toilet Transfer Details (indicate cue type and reason): stood Toileting- Water quality scientist and Hygiene: Min guard;Sit to/from stand       Functional mobility during ADLs: Min guard General ADL Comments: 4L 02     Vision       Perception     Praxis      Cognition Arousal/Alertness: Awake/alert Behavior During Therapy: WFL for tasks assessed/performed Overall Cognitive Status: Within Functional Limits for tasks assessed                                          Exercises     Shoulder Instructions       General Comments      Pertinent Vitals/ Pain       Pain Assessment: Faces Faces Pain Scale: Hurts even more Pain Location: L elbow Pain Descriptors / Indicators: Grimacing;Guarding Pain Intervention(s): Monitored during session;Repositioned;Heat applied  Home Living                                          Prior Functioning/Environment  Frequency  Min 2X/week        Progress Toward Goals  OT Goals(current goals can now be found in the care plan section)  Progress towards OT goals: Progressing toward goals  Acute Rehab OT Goals Patient Stated Goal: go home OT Goal Formulation: With patient Time For Goal Achievement: 01/13/20 Potential to Achieve Goals: Good  Plan Discharge plan remains appropriate    Co-evaluation                 AM-PAC OT "6 Clicks" Daily Activity     Outcome Measure   Help from another person eating meals?: A Little Help from another person taking care of personal grooming?: A Little Help from another person toileting, which includes using toliet, bedpan, or urinal?: A  Little Help from another person bathing (including washing, rinsing, drying)?: A Little Help from another person to put on and taking off regular upper body clothing?: A Little Help from another person to put on and taking off regular lower body clothing?: A Lot 6 Click Score: 17    End of Session    OT Visit Diagnosis: Unsteadiness on feet (R26.81)   Activity Tolerance Patient limited by pain   Patient Left in chair;with call bell/phone within reach   Nurse Communication Other (comment)(ok to try hot pack on L UE)        Time: 6440-3474 OT Time Calculation (min): 19 min  Charges: OT General Charges $OT Visit: 1 Visit OT Treatments $Self Care/Home Management : 8-22 mins  Nestor Lewandowsky, OTR/L Acute Rehabilitation Services Pager: [phone] Office: [phone] Malka So 01/05/2020, 12:11 PM

## 2020-01-05 NOTE — Progress Notes (Signed)
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  In ED labs showed creat 14, K 6.3, on lasix and ace inhibitor at home.  Asked to see for AKI; cr 1.47 on 2/4/21w/ BLcr from Jun 2020-feb 2021 1.4- 1.7.   Assessment/ Plan:   AoCKD 3 - in setting of vol depletion/ vomiting/ diarrhea and ACEi. B/l creat is 1.5.urine shows 100 of protein, few RBC -  HD started on 2/28- last on Mon 3/8 - (VIR) placed RIJ TC;   - Last  HD 3/8 - BUN and crt increasing but UOP seemed to be increasing as well-  since crt so good 5 weeks ago need to watch a little longer- daily labs- no needs for HD today but starting to get more uncomfortable with BUN close to 100- I discussed with pt- will do HD tomorrow then cont to watch but need to see if he could get AKI HD as OP if does not show signs of recovery over the weekend   2. DM on insulin 3. Vol depletion - treated-  BP in the 130's to 150's-  norvasc 5 and coreg 6.25 BID 4. Lactic / metabolic acidosis - severebut now resolved- worsening as renal function declines.  5. COPD - on homeO2. 6. Anemia-  iron stores low, gave feraheme 3/9- second dose 3/12- started ESA 7. Bones-  Phos OK-  PTH pending  Subjective:   No c/o's-  UOP 550 - BUN and crt up though-  Not admitting to being uremic but BUN of 99   Objective:   BP (!) 141/63 (BP Location: Right Arm)   Pulse 72   Temp 98.3 F (36.8 C) (Oral)   Resp 18   Ht 5\' 11"  (1.803 m)   Wt 75.6 kg   SpO2 (!) 87%   BMI 23.25 kg/m   Intake/Output Summary (Last 24 hours) at 01/05/2020 1450 Last data filed at 01/05/2020 0900 Gross per 24 hour  Intake 1020 ml  Output 500 ml  Net 520 ml   Weight change: 2.375 kg  Physical Exam: Genin bedconverses, very pleasant No rash, cyanosis or gangrene No jvd or bruits Chest clear bilatto  bases, no rales, +rhonchi RRR no MRG Abd soft ntnd no massor ascites ExttrLEedema, no wounds or ulcers Access: TDC GU: foley to gravity  Imaging: DG Elbow 2 Views Left  Result Date: 01/04/2020 CLINICAL DATA:  Left elbow pain and swelling.  No known injury. EXAM: LEFT ELBOW - 2 VIEW COMPARISON:  None. FINDINGS: There is no evidence of fracture, dislocation, or joint effusion. There is no evidence of arthropathy or other focal bone abnormality. Soft tissues are unremarkable. IMPRESSION: Negative. Electronically Signed   By: Kerby Moors M.D.   On: 01/04/2020 11:40    Labs: BMET Recent Labs  Lab 12/30/19 0417 12/31/19 0600 01/01/20 0420 01/02/20 0518 01/03/20 0431 01/04/20 0438 01/05/20 0715  NA 138 136 136 139 136 136 137  K 3.6 3.5 3.9 4.7 3.7 4.1 4.2  CL 101 100 99 103 100 102 102  CO2 25 22 22  21* 24 20* 20*  GLUCOSE 117* 101* 160* 150* 99 121* 77  BUN 48* 74* 101* 50* 72* 87* 99*  CREATININE 4.97* 6.63* 8.56* 5.37* 6.75* 8.39* 9.66*  CALCIUM 7.6* 7.8* 8.2* 8.7* 8.3* 8.5* 8.7*  PHOS 3.4 4.1  3.9 4.3 4.1 5.0* 5.7*   CBC Recent Labs  Lab 01/02/20 0518 01/03/20 0431 01/04/20 0438 01/05/20 0715  WBC 11.2* 11.0* 13.2* 14.1*  HGB 9.8* 9.5* 8.7* 8.9*  HCT 29.4* 28.5* 26.2* 26.3*  MCV 88.0 89.1 87.0 87.7  PLT 210 245 204 237    Medications:    . amLODipine  5 mg Oral Daily  . carvedilol  6.25 mg Oral BID WC  . Chlorhexidine Gluconate Cloth  6 each Topical Daily  . coumadin book   Does not apply Once  . darbepoetin (ARANESP) injection - NON-DIALYSIS  60 mcg Subcutaneous Q Mon-1800  . feeding supplement (NEPRO CARB STEADY)  237 mL Oral TID BM  . guaiFENesin  600 mg Oral BID  . insulin aspart  0-5 Units Subcutaneous QHS  . insulin aspart  0-9 Units Subcutaneous TID WC  . insulin aspart  4 Units Subcutaneous TID WC  . [START ON 01/06/2020] insulin glargine  8 Units Subcutaneous Daily  . levETIRAcetam  500 mg Oral BID  . multivitamin  1 tablet Oral QHS  .  senna-docusate  1 tablet Oral BID  . umeclidinium-vilanterol  1 puff Inhalation Daily  . warfarin  2.5 mg Oral ONCE-1800  . Warfarin - Pharmacist Dosing Inpatient   Does not apply [place]  01/05/2020, 2:50 PM

## 2020-01-05 NOTE — Progress Notes (Signed)
PROGRESS NOTE    Kyle [name]  EVO:[id number] DOB: 01-20-1951 DOA: 12/24/2019 PCP: Alonna Buckler, MD   Brief Narrative: As pr HPI:  50 yom history of heavy smoking, COPD, peripheral vascular disease presenting with nausea, intermittent vomiting, abd cramping/pain and diarrhea for 1 week found to have acute renal failure, hyperkalemia and severe lactic acidosis of unclear etiology. Patient debated on 12/25/2019 and extubated on 12/26/2019.  Patient back on baseline O2 and following commands appropriately.  He is deconditioned weak and frail.  Subjective:  Left elbow feels better. Afebrile overnight. Leukocytosis at  14k last HD 3/8-  Bun/cr rising foley cath + UOP 550 ml uop No other complaints Glucose in 50s overnight    Assessment & Plan:  Acute renal failure on CKD stage IIIa in the setting of volume depletion/vomiting diarrhea, ACE inhibitor.  Baseline creatinine 1.5.  Seen by nephrology placed on HD 2/28 last HD on 3/8.  Right IJ TDC.  BUN/creatinine is to worsen.  Nephrology following closely await for further recommendation regarding HD access/HD.  Continue to monitor his renal function.  Acute metabolic encephalopathy;?  Etiology likely secondary to seizure in the setting of significant uremia.  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  Had ammonia level high at 56.  Initially needed intubation subsequently extubated 3/2, CT head and MRI head no acute intracranial finding.  EEG was negative.  Seen by neurology, placed on Keppra twice daily.  Patient is alert awake oriented.  Mental status has improved.  Continue supportive care.  Left elbow pain/swelling- ?gout w AKI/CKD related-feels much improved x-ray unremarkable uric acid normal.  Continue pain control.  Elevate the left arm.    Hypertension: Blood pressure fairly stable continue Norvasc and Coreg.  PAF/dilated cardiomyopathy/aortic stenosis/peripheral vascular disease: Patient has been lost to follow-up with cardiology in the  past, with medication noncompliance.  Patient is reportedly was compliant with his medication at home recently.  On apixaban prior to admission now transitioned to Coumadin the setting of his acute kidney injury.Continue to monitor INR and pharmacy dosing.  Continue Coreg.  Hyperkalemia resolved.  Lactic acidosis/metabolic acidosis severe but resolved currently.  Vomiting diarrhea and abdominal pain/ volume depletion treated, currently stable.  CT abdomen and pelvis with renal stone protocol no source of patient's abdominal pain.  Overall symptoms have resolved.   Could be possibly from ischemic colitis per pccm.  Chronic hypoxic respiratory failure/COPD on home oxygen 4 to 5 L nasal cannula.  Continue the same.  Continue pulmonary support, bronchodilators and ambulation.    Coffee-ground emesis H&H stable.monitor Recent Labs  Lab 01/01/20 0420 01/02/20 0518 01/03/20 0431 01/04/20 0438 01/05/20 0715  HGB 9.0* 9.8* 9.5* 8.7* 8.9*  HCT 26.6* 29.4* 28.5* 26.2* 26.3*   Anemia with low iron storage:gave Feraheme 3/9 and also started on ESA.  Appreciate nephrology input.  Right lower lung nodule 5 mm repeat CT scan in 12 months will be needed.  Follow-up with pulmonary outpatient.  Insulin-dependent diabetes mellitus A1c 8.0.Blood sugar w low  In 50s- cut down lantus  to 8 units daily, 4 units premeal insulin and sliding scale insulin and monitor. Recent Labs  Lab 01/04/20 0656 01/04/20 1124 01/04/20 1610 01/05/20 0657 01/05/20 0716  GLUCAP 97 206* 202* 59* 72   Nutrition: Nutrition Problem: Inadequate oral intake Etiology: inability to eat Signs/Symptoms: NPO status Interventions: MVI, Nepro shake Body mass index is 23.25 kg/m.   DVT prophylaxis:warfarin Code Status:FULL Family Communication: plan of care discussed with patient at bedside.  Disposition Plan: Patient is from:home Anticipated Disposition: to HHPT/Supervision for mobility Barriers to discharge or conditions  that needs to be met prior to discharge: Patient remains in the hospital with acute renal failure, monitor renal functions for recovery and diaysis need.Had needed previously last one on 3/8 currently on hold. If no   Renal recovery will need access for dialysis and clip.  Consultants:   Interventional radiology: Dr. Earleen Newport  Neurology: Dr. Cheral Marker 12/24/2019  Nephrology: Dr. Jonnie Finner 12/24/2019  [facility] hospitalist: Dr. Lorin Mercy 12/24/2019  Procedures:   CT renal stone protocol 12/24/2019  Chest x-ray 12/24/2019, 12/25/2019  MRI brain MRA head 12/24/2019  Tunneled central venous HD catheter placement per IR 12/28/2019  Renal ultrasound 12/24/2019  EEG 12/24/2019--- suggestive of moderate to severe diffuse encephalopathy, nonspecific etiology.  Right IJ HD cath 12/24/2019  ETT 12/25/2019>>>> 12/26/2019  Microbiology:see note  Medications: Scheduled Meds: . amLODipine  5 mg Oral Daily  . carvedilol  6.25 mg Oral BID WC  . Chlorhexidine Gluconate Cloth  6 each Topical Daily  . coumadin book   Does not apply Once  . darbepoetin (ARANESP) injection - NON-DIALYSIS  60 mcg Subcutaneous Q Mon-1800  . feeding supplement (NEPRO CARB STEADY)  237 mL Oral TID BM  . guaiFENesin  600 mg Oral BID  . insulin aspart  0-5 Units Subcutaneous QHS  . insulin aspart  0-9 Units Subcutaneous TID WC  . insulin aspart  4 Units Subcutaneous TID WC  . insulin glargine  14 Units Subcutaneous Daily  . levETIRAcetam  500 mg Oral BID  . multivitamin  1 tablet Oral QHS  . senna-docusate  1 tablet Oral BID  . umeclidinium-vilanterol  1 puff Inhalation Daily  . Warfarin - Pharmacist Dosing Inpatient   Does not apply q1800   Continuous Infusions: . sodium chloride 250 mL (12/29/19 0532)  . ferumoxytol      Antimicrobials: Anti-infectives (From admission, onward)   Start     Dose/Rate Route Frequency Ordered Stop   12/28/19 1400  ceFAZolin (ANCEF) IVPB 2g/100 mL premix     2 g 200 mL/hr over 30 Minutes Intravenous   Once 12/28/19 1347 12/28/19 1533       Objective: Vitals: Today's Vitals   01/04/20 2124 01/05/20 0441 01/05/20 0840 01/05/20 0907  BP:  129/85  (!) 141/63  Pulse:  77  72  Resp:  18  18  Temp:  98 F (36.7 C)  98.3 F (36.8 C)  TempSrc:  Oral  Oral  SpO2:  96% 92% (!) 87%  Weight:      Height:      PainSc: 6        Intake/Output Summary (Last 24 hours) at 01/05/2020 0935 Last data filed at 01/05/2020 0601 Gross per 24 hour  Intake 960 ml  Output 350 ml  Net 610 ml   Filed Weights   01/01/20 1700 01/03/20 2118 01/04/20 2106  Weight: 73.2 kg 73.2 kg 75.6 kg   Weight change: 2.375 kg   Intake/Output from previous day: 03/11 0701 - 03/12 0700 In: 1260 [P.O.:1260] Out: 550 [Urine:550] Intake/Output this shift: No intake/output data recorded.  Examination:  General exam: AAOX3 , not in acute distress, weak and frail appearing.   HEENT:Oral mucosa moist, Ear/Nose WNL grossly,dentition normal. Respiratory system: bilaterally clear,no use of accessory muscle, non tender. Cardiovascular system: S1 & S2 +, regular, No JVD. Gastrointestinal system: Abdomen soft, NT,ND, BS+. Nervous System:Alert, awake, moving extremities and grossly nonfocal Extremities: No leg edema, distal peripheral pulses  palpable.  Skin: No rashes,no icterus. MSK: Normal muscle bulk,tone, power RT CHEST W/ HD catheter+  Data Reviewed: I have personally reviewed following labs and imaging studies CBC: Recent Labs  Lab 01/01/20 0420 01/02/20 0518 01/03/20 0431 01/04/20 0438 01/05/20 0715  WBC 14.0* 11.2* 11.0* 13.2* 14.1*  HGB 9.0* 9.8* 9.5* 8.7* 8.9*  HCT 26.6* 29.4* 28.5* 26.2* 26.3*  MCV 86.6 88.0 89.1 87.0 87.7  PLT 181 210 245 204 283   Basic Metabolic Panel: Recent Labs  Lab 01/01/20 0420 01/02/20 0518 01/03/20 0431 01/04/20 0438 01/05/20 0715  NA 136 139 136 136 137  K 3.9 4.7 3.7 4.1 4.2  CL 99 103 100 102 102  CO2 22 21* 24 20* 20*  GLUCOSE 160* 150* 99 121* 77  BUN  101* 50* 72* 87* 99*  CREATININE 8.56* 5.37* 6.75* 8.39* 9.66*  CALCIUM 8.2* 8.7* 8.3* 8.5* 8.7*  PHOS 3.9 4.3 4.1 5.0* 5.7*   GFR: Estimated Creatinine Clearance: 7.9 mL/min (A) (by C-G formula based on SCr of 9.66 mg/dL (H)). Liver Function Tests: Recent Labs  Lab 01/01/20 0420 01/02/20 0518 01/03/20 0431 01/04/20 0438 01/05/20 0715  ALBUMIN 2.2* 2.5* 2.4* 2.1* 2.2*   No results for input(s): LIPASE, AMYLASE in the last 168 hours. No results for input(s): AMMONIA in the last 168 hours. Coagulation Profile: Recent Labs  Lab 01/01/20 0420 01/02/20 0518 01/03/20 0431 01/04/20 0438 01/05/20 0715  INR 3.0* 1.5* 1.7* 2.2* 2.3*   Cardiac Enzymes: No results for input(s): CKTOTAL, CKMB, CKMBINDEX, TROPONINI in the last 168 hours. BNP (last 3 results) No results for input(s): PROBNP in the last 8760 hours. HbA1C: No results for input(s): HGBA1C in the last 72 hours. CBG: Recent Labs  Lab 01/04/20 0656 01/04/20 1124 01/04/20 1610 01/05/20 0657 01/05/20 0716  GLUCAP 97 206* 202* 59* 72   Lipid Profile: No results for input(s): CHOL, HDL, LDLCALC, TRIG, CHOLHDL, LDLDIRECT in the last 72 hours. Thyroid Function Tests: No results for input(s): TSH, T4TOTAL, FREET4, T3FREE, THYROIDAB in the last 72 hours. Anemia Panel: No results for input(s): VITAMINB12, FOLATE, FERRITIN, TIBC, IRON, RETICCTPCT in the last 72 hours. Sepsis Labs: No results for input(s): PROCALCITON, LATICACIDVEN in the last 168 hours.  No results found for this or any previous visit (from the past 240 hour(s)).    Radiology Studies: DG Elbow 2 Views Left  Result Date: 01/04/2020 CLINICAL DATA:  Left elbow pain and swelling.  No known injury. EXAM: LEFT ELBOW - 2 VIEW COMPARISON:  None. FINDINGS: There is no evidence of fracture, dislocation, or joint effusion. There is no evidence of arthropathy or other focal bone abnormality. Soft tissues are unremarkable. IMPRESSION: Negative. Electronically Signed    By: Kerby Moors M.D.   On: 01/04/2020 11:40     LOS: 12 days   Time spent: More than 50% of that time was spent in counseling and/or coordination of care.  Antonieta Pert, MD [facility]  01/05/2020, 9:35 AM

## 2020-01-05 NOTE — Progress Notes (Signed)
ANTICOAGULATION CONSULT NOTE - Follow-Up  Pharmacy Consult for warfarin Indication: atrial fibrillation  No Known Allergies  Patient Measurements: Height: 5\' 11"  (180.3 cm) Weight: 166 lb 10.7 oz (75.6 kg) IBW/kg (Calculated) : 75.3  Heparin Dosing Weight: 70.6 kg  Vital Signs: Temp: 98.3 F (36.8 C) (03/12 0907) Temp Source: Oral (03/12 0907) BP: 141/63 (03/12 0907) Pulse Rate: 72 (03/12 0907)  Labs: Recent Labs    01/03/20 0431 01/03/20 0431 01/04/20 0438 01/05/20 0715  HGB 9.5*   < > 8.7* 8.9*  HCT 28.5*  --  26.2* 26.3*  PLT 245  --  204 237  LABPROT 19.5*  --  24.4* 25.0*  INR 1.7*  --  2.2* 2.3*  CREATININE 6.75*  --  8.39* 9.66*   < > = values in this interval not displayed.    Estimated Creatinine Clearance: 7.9 mL/min (A) (by C-G formula based on SCr of 9.66 mg/dL (H)).  Assessment: 69 yr old male presented with N/V/D X 1 week, found to have acute renal failure. Pt has hx of a fib, for which he was taking apixaban PTA (last dose on 2/27, due to concern for coffee ground emesis, which has resolved; pt was on SQ heparin). Pharmacy was consulted to dose warfarin for Afib.  INR was noted to have a quick rise on 3/7 after only 2 doses. Warfarin was held 3/7 + 3/8 evening and resumed on 3/9 and has received 5 mg, 2.5mg  since then.  INR today is therapeutic (INR 2.2 << 1.7, goal of 2-3). Hgb 8.7, plts wnl  Goal of Therapy:  INR 2-3 Monitor platelets by anticoagulation protocol: Yes   Plan:  - Repeat Warfarin 2.5 mg x 1 dose at 1800 today - Daily PT/INR, CBC q72h - Will continue to monitor for any signs/symptoms of bleeding and will follow up with PT/INR in the a.m.    Thank you for allowing pharmacy to be a part of this patient's care.  Alycia Rossetti, PharmD, BCPS Clinical Pharmacist Clinical phone for 01/05/2020: [phone] 01/05/2020 9:29 AM   **Pharmacist phone directory can now be found on [email or web address] ([email or web address]).  Listed under [place].

## 2020-01-06 LAB — GLUCOSE, CAPILLARY
Glucose-Capillary: 85 mg/dL (ref 70–99)
Glucose-Capillary: 228 mg/dL — ABNORMAL HIGH (ref 70–99)
Glucose-Capillary: 79 mg/dL (ref 70–99)
Glucose-Capillary: 177 mg/dL — ABNORMAL HIGH (ref 70–99)

## 2020-01-06 LAB — CBC
HCT: 24.9 % — ABNORMAL LOW (ref 39.0–52.0)
Hemoglobin: 8.3 g/dL — ABNORMAL LOW (ref 13.0–17.0)
MCH: 29.2 pg (ref 26.0–34.0)
MCHC: 33.3 g/dL (ref 30.0–36.0)
MCV: 87.7 fL (ref 80.0–100.0)
Platelets: 230 10*3/uL (ref 150–400)
RBC: 2.84 MIL/uL — ABNORMAL LOW (ref 4.22–5.81)
RDW: 16.5 % — ABNORMAL HIGH (ref 11.5–15.5)
WBC: 12.1 10*3/uL — ABNORMAL HIGH (ref 4.0–10.5)
nRBC: 0.2 % (ref 0.0–0.2)

## 2020-01-06 LAB — RENAL FUNCTION PANEL
Albumin: 2.2 g/dL — ABNORMAL LOW (ref 3.5–5.0)
Anion gap: 16 — ABNORMAL HIGH (ref 5–15)
BUN: 103 mg/dL — ABNORMAL HIGH (ref 8–23)
CO2: 20 mmol/L — ABNORMAL LOW (ref 22–32)
Calcium: 8.7 mg/dL — ABNORMAL LOW (ref 8.9–10.3)
Chloride: 102 mmol/L (ref 98–111)
Creatinine, Ser: 10.1 mg/dL — ABNORMAL HIGH (ref 0.61–1.24)
GFR calc Af Amer: 5 mL/min — ABNORMAL LOW (ref >60)
GFR calc non Af Amer: 5 mL/min — ABNORMAL LOW (ref >60)
Glucose, Bld: 106 mg/dL — ABNORMAL HIGH (ref 70–99)
Phosphorus: 6.1 mg/dL — ABNORMAL HIGH (ref 2.5–4.6)
Potassium: 3.9 mmol/L (ref 3.5–5.1)
Sodium: 138 mmol/L (ref 135–145)

## 2020-01-06 LAB — PARATHYROID HORMONE, INTACT (NO CA): PTH: 131 pg/mL — ABNORMAL HIGH (ref 15–65)

## 2020-01-06 LAB — PROTIME-INR
INR: 2.5 — ABNORMAL HIGH (ref 0.8–1.2)
Prothrombin Time: 27 seconds — ABNORMAL HIGH (ref 11.4–15.2)

## 2020-01-06 MED ORDER — PREDNISONE 20 MG PO TABS
20.0000 mg | ORAL_TABLET | Freq: Every day | ORAL | Status: AC
  Administered 2020-01-06 – 2020-01-10 (×5): 20 mg via ORAL
  Filled 2020-01-06 (×5): qty 1

## 2020-01-06 MED ORDER — HEPARIN SODIUM (PORCINE) 1000 UNIT/ML DIALYSIS
20.0000 [IU]/kg | INTRAMUSCULAR | Status: DC | PRN
  Administered 2020-01-06: 1000 [IU] via INTRAVENOUS_CENTRAL

## 2020-01-06 MED ORDER — HEPARIN SODIUM (PORCINE) 1000 UNIT/ML IJ SOLN
INTRAMUSCULAR | Status: AC
  Filled 2020-01-06: qty 4

## 2020-01-06 MED ORDER — INSULIN GLARGINE 100 UNIT/ML ~~LOC~~ SOLN
4.0000 [IU] | Freq: Every day | SUBCUTANEOUS | Status: DC
  Administered 2020-01-06 – 2020-01-10 (×5): 4 [IU] via SUBCUTANEOUS
  Filled 2020-01-06 (×5): qty 0.04

## 2020-01-06 MED ORDER — INSULIN ASPART 100 UNIT/ML ~~LOC~~ SOLN
2.0000 [IU] | Freq: Three times a day (TID) | SUBCUTANEOUS | Status: DC
  Administered 2020-01-06 – 2020-01-10 (×12): 2 [IU] via SUBCUTANEOUS

## 2020-01-06 MED ORDER — WARFARIN SODIUM 2.5 MG PO TABS
2.5000 mg | ORAL_TABLET | Freq: Once | ORAL | Status: AC
  Administered 2020-01-06: 2.5 mg via ORAL
  Filled 2020-01-06: qty 1

## 2020-01-06 NOTE — Progress Notes (Signed)
PROGRESS NOTE    DAILY CRATE  OXB:[id number] DOB: 1951-06-04 DOA: 12/24/2019 PCP: Alonna Buckler, MD   Brief Narrative: As pr HPI:  26 yom history of heavy smoking, COPD, peripheral vascular disease presenting with nausea, intermittent vomiting, abd cramping/pain and diarrhea for 1 week found to have acute renal failure, hyperkalemia and severe lactic acidosis of unclear etiology. Patient debated on 12/25/2019 and extubated on 12/26/2019.  Patient back on baseline O2 and following commands appropriately.  He is deconditioned weak and frail.  Subjective:  Seen and examined. Left elbow pain is better but is still tender on examination. Patient is afebrile overnight.   Blood work continues to show worsening BUN and creatinine.  Urine output 950/24 HR. Leukocytosis at  14.1K-> 12.1k last HD 3/8 NO MORE hypoglycemia.  Assessment & Plan:  Acute renal failure on CKD stage IIIa/metabolic acidosis/hyperphosphatemia: in the setting of volume depletion/vomiting diarrhea, ACE inhibitor.  Baseline creatinine 1.5.  Seen by nephrology placed on HD 2/28 last HD on 3/8.  Right IJ TDC.  BUN/creatinine continues to worsen.  Noted nephrology plan to resume dialysis today and anticipating outpatient dialysis and will need to be clipped if no renal recovery by Monday. Continue to monitor his renal function.  Acute metabolic encephalopathy;?  Etiology likely secondary to seizure in the setting of significant uremia.  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  Had ammonia level high at 56.  Initially needed intubation subsequently extubated 3/2, CT head and MRI head no acute intracranial finding.  EEG was negative.  Seen by neurology, placed on Keppra twice daily.  Patient is alert awake oriented.  Mental status has improved.  Continue supportive care.  Left elbow pain/swelling- ?gout w AKI/CKD related-swelling, I will add prednisone tomogram x5 days for possible gout flareup.  Continue pain control. X-ray  unremarkable uric acid normal.Elevate the left arm.    Hypertension: Blood pressure fairly stable continue Norvasc and Coreg.  PAF/dilated cardiomyopathy/aortic stenosis/peripheral vascular disease: Patient has been lost to follow-up with cardiology in the past, with medication noncompliance.  Patient is reportedly was compliant with his medication at home recently.  On apixaban prior to admission now transitioned to Coumadin the setting of his acute kidney injury.Continue to monitor INR and pharmacy dosing.  Continue Coreg.  Hyperkalemia resolved.  Lactic acidosis/metabolic acidosis severe but resolved currently.  Vomiting diarrhea and abdominal pain/ volume depletion treated, currently stable.  CT abdomen and pelvis with renal stone protocol no source of patient's abdominal pain.  Overall symptoms have resolved.   Could be possibly from ischemic colitis per pccm.  Chronic hypoxic respiratory failure/COPD on home oxygen 4 to 5 L nasal cannula.  Continue the same.  Continue pulmonary support, bronchodilators and ambulation.    Coffee-ground emesis H&H stable.monitor Recent Labs  Lab 01/02/20 0518 01/03/20 0431 01/04/20 0438 01/05/20 0715 01/06/20 0517  HGB 9.8* 9.5* 8.7* 8.9* 8.3*  HCT 29.4* 28.5* 26.2* 26.3* 24.9*   Anemia with low iron storage:gave Feraheme 3/9 and also started on ESA.  Appreciate nephrology input.  Right lower lung nodule 5 mm repeat CT scan in 12 months will be needed.  Follow-up with pulmonary outpatient.  Insulin-dependent diabetes mellitus A1c 8.0.Blood sugar w low  In 50s/hypoglycemic likely due to patient worsening renal failure and decreased insulin clearance.  Will cut down Lantus further to 4 units daily, 2 units premeal insulin and sliding scale insulin and monitor. Recent Labs  Lab 01/05/20 0716 01/05/20 1111 01/05/20 1605 01/05/20 2058 01/06/20 0720  GLUCAP  72 191* 107* 146* 85   Nutrition: Nutrition Problem: Inadequate oral intake Etiology:  inability to eat Signs/Symptoms: NPO status Interventions: MVI, Nepro shake Body mass index is 23.25 kg/m.   DVT prophylaxis:warfarin Code Status:FULL Family Communication: plan of care discussed with patient at bedside. Disposition Plan: Patient is from:home Anticipated Disposition: to HHPT/Supervision for mobility Barriers to discharge or conditions that needs to be met prior to discharge: Patient remains in the hospital with acute renal failure, monitor renal functions for recovery and diaysis need.Had needed previously last one on 3/8.  Unlikely have renal recovery anticipating outpatient dialysis.    Consultants:   Interventional radiology: Dr. Earleen Newport  Neurology: Dr. Cheral Marker 12/24/2019  Nephrology: Dr. Jonnie Finner 12/24/2019  [facility] hospitalist: Dr. Lorin Mercy 12/24/2019  Procedures:   CT renal stone protocol 12/24/2019  Chest x-ray 12/24/2019, 12/25/2019  MRI brain MRA head 12/24/2019  Tunneled central venous HD catheter placement per IR 12/28/2019  Renal ultrasound 12/24/2019  EEG 12/24/2019--- suggestive of moderate to severe diffuse encephalopathy, nonspecific etiology.  Right IJ HD cath 12/24/2019  ETT 12/25/2019>>>> 12/26/2019  Microbiology:see note  Medications: Scheduled Meds: . amLODipine  5 mg Oral Daily  . carvedilol  6.25 mg Oral BID WC  . Chlorhexidine Gluconate Cloth  6 each Topical Daily  . Chlorhexidine Gluconate Cloth  6 each Topical Q0600  . coumadin book   Does not apply Once  . darbepoetin (ARANESP) injection - NON-DIALYSIS  60 mcg Subcutaneous Q Mon-1800  . feeding supplement (NEPRO CARB STEADY)  237 mL Oral TID BM  . guaiFENesin  600 mg Oral BID  . insulin aspart  0-5 Units Subcutaneous QHS  . insulin aspart  0-9 Units Subcutaneous TID WC  . insulin aspart  4 Units Subcutaneous TID WC  . insulin glargine  8 Units Subcutaneous Daily  . levETIRAcetam  500 mg Oral BID  . multivitamin  1 tablet Oral QHS  . senna-docusate  1 tablet Oral BID  .  umeclidinium-vilanterol  1 puff Inhalation Daily  . Warfarin - Pharmacist Dosing Inpatient   Does not apply q1800   Continuous Infusions: . sodium chloride 250 mL (12/29/19 0532)    Antimicrobials: Anti-infectives (From admission, onward)   Start     Dose/Rate Route Frequency Ordered Stop   12/28/19 1400  ceFAZolin (ANCEF) IVPB 2g/100 mL premix     2 g 200 mL/hr over 30 Minutes Intravenous  Once 12/28/19 1347 12/28/19 1533       Objective: Vitals: Today's Vitals   01/05/20 2048 01/05/20 2058 01/06/20 0440 01/06/20 0802  BP:  138/66 (!) 142/78   Pulse:  74 (!) 107   Resp:  19 20   Temp:  98.8 F (37.1 C) 98.5 F (36.9 C)   TempSrc:  Oral Oral   SpO2:  (!) 89% 93% 92%  Weight:      Height:      PainSc: 0-No pain       Intake/Output Summary (Last 24 hours) at 01/06/2020 0803 Last data filed at 01/06/2020 0500 Gross per 24 hour  Intake 900 ml  Output 950 ml  Net -50 ml   Filed Weights   01/01/20 1700 01/03/20 2118 01/04/20 2106  Weight: 73.2 kg 73.2 kg 75.6 kg   Weight change:    Intake/Output from previous day: 03/12 0701 - 03/13 0700 In: 900 [P.O.:900] Out: 950 [Urine:950] Intake/Output this shift: No intake/output data recorded.  Examination:  General exam: AAOX3 , not in acute distress, weak and frail appearing.   HEENT:Oral mucosa  moist, Ear/Nose WNL grossly,dentition normal. Respiratory system: bilaterally clear,no use of accessory muscle, non tender. Cardiovascular system: S1 & S2 +, regular, No JVD. Gastrointestinal system: Abdomen soft, NT,ND, BS+. Nervous System:Alert, awake, moving extremities and grossly nonfocal Extremities: No leg edema, distal peripheral pulses palpable.  Skin: No rashes,no icterus. MSK: Normal muscle bulk,tone, power RT CHEST W/ HD catheter+  Data Reviewed: I have personally reviewed following labs and imaging studies CBC: Recent Labs  Lab 01/02/20 0518 01/03/20 0431 01/04/20 0438 01/05/20 0715 01/06/20 0517  WBC  11.2* 11.0* 13.2* 14.1* 12.1*  HGB 9.8* 9.5* 8.7* 8.9* 8.3*  HCT 29.4* 28.5* 26.2* 26.3* 24.9*  MCV 88.0 89.1 87.0 87.7 87.7  PLT 210 245 204 237 132   Basic Metabolic Panel: Recent Labs  Lab 01/02/20 0518 01/03/20 0431 01/04/20 0438 01/05/20 0715 01/06/20 0517  NA 139 136 136 137 138  K 4.7 3.7 4.1 4.2 3.9  CL 103 100 102 102 102  CO2 21* 24 20* 20* 20*  GLUCOSE 150* 99 121* 77 106*  BUN 50* 72* 87* 99* 103*  CREATININE 5.37* 6.75* 8.39* 9.66* 10.10*  CALCIUM 8.7* 8.3* 8.5* 8.7* 8.7*  PHOS 4.3 4.1 5.0* 5.7* 6.1*   GFR: Estimated Creatinine Clearance: 7.6 mL/min (A) (by C-G formula based on SCr of 10.1 mg/dL (H)). Liver Function Tests: Recent Labs  Lab 01/02/20 0518 01/03/20 0431 01/04/20 0438 01/05/20 0715 01/06/20 0517  ALBUMIN 2.5* 2.4* 2.1* 2.2* 2.2*   No results for input(s): LIPASE, AMYLASE in the last 168 hours. No results for input(s): AMMONIA in the last 168 hours. Coagulation Profile: Recent Labs  Lab 01/02/20 0518 01/03/20 0431 01/04/20 0438 01/05/20 0715 01/06/20 0517  INR 1.5* 1.7* 2.2* 2.3* 2.5*   Cardiac Enzymes: No results for input(s): CKTOTAL, CKMB, CKMBINDEX, TROPONINI in the last 168 hours. BNP (last 3 results) No results for input(s): PROBNP in the last 8760 hours. HbA1C: No results for input(s): HGBA1C in the last 72 hours. CBG: Recent Labs  Lab 01/05/20 0716 01/05/20 1111 01/05/20 1605 01/05/20 2058 01/06/20 0720  GLUCAP 72 191* 107* 146* 85   Lipid Profile: No results for input(s): CHOL, HDL, LDLCALC, TRIG, CHOLHDL, LDLDIRECT in the last 72 hours. Thyroid Function Tests: No results for input(s): TSH, T4TOTAL, FREET4, T3FREE, THYROIDAB in the last 72 hours. Anemia Panel: No results for input(s): VITAMINB12, FOLATE, FERRITIN, TIBC, IRON, RETICCTPCT in the last 72 hours. Sepsis Labs: No results for input(s): PROCALCITON, LATICACIDVEN in the last 168 hours.  No results found for this or any previous visit (from the past 240  hour(s)).    Radiology Studies: DG Elbow 2 Views Left  Result Date: 01/04/2020 CLINICAL DATA:  Left elbow pain and swelling.  No known injury. EXAM: LEFT ELBOW - 2 VIEW COMPARISON:  None. FINDINGS: There is no evidence of fracture, dislocation, or joint effusion. There is no evidence of arthropathy or other focal bone abnormality. Soft tissues are unremarkable. IMPRESSION: Negative. Electronically Signed   By: Kerby Moors M.D.   On: 01/04/2020 11:40     LOS: 13 days   Time spent: More than 50% of that time was spent in counseling and/or coordination of care.  Antonieta Pert, MD [facility]  01/06/2020, 8:03 AM

## 2020-01-06 NOTE — Progress Notes (Signed)
PT Cancellation Note  Patient Details Name: VENIAMIN KINCAID MRN: [id number] DOB: September 18, 1951   Cancelled Treatment:    Reason Eval/Treat Not Completed: Patient at procedure or test/unavailable. Will re-attempt at later day/time.    Jaz Laningham 01/06/2020, 3:43 PM

## 2020-01-06 NOTE — Progress Notes (Signed)
[name] Progress Note   69 y.o.year-old with h/o pulm HTN, PAD, HTN, DCM EF 20-25% (remote echo), COPD- chronically on 4 L  presented w/ abd pain , nausea/ vomiting and diarrhea x1wk + mid upper abd pain.  In ED labs showed creat 14, K 6.3, on lasix and ace inhibitor at home.  Asked to see for AKI; cr 1.47 on 2/4/21w/ BLcr from Jun 2020-feb 2021 1.4- 1.7.   Assessment/ Plan:   AoCKD 3 - in setting of vol depletion/ vomiting/ diarrhea and ACEi. B/l creat is 1.5.urine shows 100 of protein, few RBC -  HD started on 2/28- last on Mon 3/8 - (VIR) placed RIJ TC;   - Last  HD 3/8 - BUN and crt increasing but UOP seemed to be increasing as well-  since crt so good 5 weeks ago need to watch a little longer- daily labs- no needs for HD today but starting to get more uncomfortable with BUN close to 100- I discussed with pt- will do HD today then cont to watch but need to see if he could get AKI HD as OP if does not show signs of recovery over the weekend - have informed renal navigator to look and see if would qualify for OP HD in the setting of AKI.  Updated wife over the phone  2. DM on insulin 3. Vol depletion - treated-  BP in the 130's to 150's-  norvasc 5 and coreg 6.25 BID 4. Lactic / metabolic acidosis - severethen resolved- worsening as renal function declines.  5. COPD - on homeO2. 6. Anemia-  iron stores low, gave feraheme 3/9- second dose 3/12- started ESA 7. Bones-  Phos OK-  PTH 131 - no meds  Subjective:   No c/o's-  UOP 950 - BUN and crt up though-  Not admitting to being uremic but BUN of 103-  Planning on HD today for clearance   Objective:   BP 132/66 (BP Location: Right Arm)   Pulse 77   Temp 98 F (36.7 C) (Oral)   Resp 18   Ht 5\' 11"  (1.803 m)   Wt 75.6 kg   SpO2 92%   BMI 23.25 kg/m   Intake/Output Summary (Last 24 hours) at 01/06/2020 1058 Last data filed at 01/06/2020 0900 Gross per 24 hour  Intake 840 ml  Output 1150 ml  Net -310 ml    Weight change:   Physical Exam: Genin bedconverses, very pleasant No rash, cyanosis or gangrene No jvd or bruits Chest clear bilatto bases, no rales, +rhonchi RRR no MRG Abd soft ntnd no massor ascites ExttrLEedema, no wounds or ulcers Access: TDC GU: foley to gravity  Imaging: DG Elbow 2 Views Left  Result Date: 01/04/2020 CLINICAL DATA:  Left elbow pain and swelling.  No known injury. EXAM: LEFT ELBOW - 2 VIEW COMPARISON:  None. FINDINGS: There is no evidence of fracture, dislocation, or joint effusion. There is no evidence of arthropathy or other focal bone abnormality. Soft tissues are unremarkable. IMPRESSION: Negative. Electronically Signed   By: Kerby Moors M.D.   On: 01/04/2020 11:40    Labs: BMET Recent Labs  Lab 12/31/19 0600 01/01/20 0420 01/02/20 0518 01/03/20 0431 01/04/20 0438 01/05/20 0715 01/06/20 0517  NA 136 136 139 136 136 137 138  K 3.5 3.9 4.7 3.7 4.1 4.2 3.9  CL 100 99 103 100 102 102 102  CO2 22 22 21* 24 20* 20* 20*  GLUCOSE 101* 160* 150* 99 121* 77 106*  BUN 74* 101* 50* 72* 87* 99* 103*  CREATININE 6.63* 8.56* 5.37* 6.75* 8.39* 9.66* 10.10*  CALCIUM 7.8* 8.2* 8.7* 8.3* 8.5* 8.7* 8.7*  PHOS 4.1 3.9 4.3 4.1 5.0* 5.7* 6.1*   CBC Recent Labs  Lab 01/03/20 0431 01/04/20 0438 01/05/20 0715 01/06/20 0517  WBC 11.0* 13.2* 14.1* 12.1*  HGB 9.5* 8.7* 8.9* 8.3*  HCT 28.5* 26.2* 26.3* 24.9*  MCV 89.1 87.0 87.7 87.7  PLT 245 204 237 230    Medications:    . amLODipine  5 mg Oral Daily  . carvedilol  6.25 mg Oral BID WC  . Chlorhexidine Gluconate Cloth  6 each Topical Daily  . Chlorhexidine Gluconate Cloth  6 each Topical Q0600  . coumadin book   Does not apply Once  . darbepoetin (ARANESP) injection - NON-DIALYSIS  60 mcg Subcutaneous Q Mon-1800  . feeding supplement (NEPRO CARB STEADY)  237 mL Oral TID BM  . guaiFENesin  600 mg Oral BID  . insulin aspart  0-5 Units Subcutaneous QHS  . insulin aspart  0-9 Units Subcutaneous  TID WC  . insulin aspart  2 Units Subcutaneous TID WC  . insulin glargine  4 Units Subcutaneous Daily  . levETIRAcetam  500 mg Oral BID  . multivitamin  1 tablet Oral QHS  . predniSONE  20 mg Oral Q breakfast  . senna-docusate  1 tablet Oral BID  . umeclidinium-vilanterol  1 puff Inhalation Daily  . warfarin  2.5 mg Oral ONCE-1800  . Warfarin - Pharmacist Dosing Inpatient   Does not apply [place]  01/06/2020, 10:58 AM

## 2020-01-06 NOTE — Progress Notes (Signed)
ANTICOAGULATION CONSULT NOTE - Follow-Up  Pharmacy Consult for warfarin Indication: atrial fibrillation  No Known Allergies  Patient Measurements: Height: 5\' 11"  (180.3 cm) Weight: 166 lb 10.7 oz (75.6 kg) IBW/kg (Calculated) : 75.3  Heparin Dosing Weight: 70.6 kg  Vital Signs: Temp: 98.5 F (36.9 C) (03/13 0440) Temp Source: Oral (03/13 0440) BP: 142/78 (03/13 0440) Pulse Rate: 107 (03/13 0440)  Labs: Recent Labs    01/04/20 0438 01/04/20 0438 01/05/20 0715 01/06/20 0517  HGB 8.7*   < > 8.9* 8.3*  HCT 26.2*  --  26.3* 24.9*  PLT 204  --  237 230  LABPROT 24.4*  --  25.0* 27.0*  INR 2.2*  --  2.3* 2.5*  CREATININE 8.39*  --  9.66* 10.10*   < > = values in this interval not displayed.    Estimated Creatinine Clearance: 7.6 mL/min (A) (by C-G formula based on SCr of 10.1 mg/dL (H)).  Assessment: 69 yr old male presented with N/V/D X 1 week, found to have acute renal failure. Pt has hx of a fib, for which he was taking apixaban PTA (last dose on 2/27, due to concern for coffee ground emesis, which has resolved; pt was on SQ heparin). With worsening renal fxn, pharmacy was consulted to dose warfarin for Afib.  INR was noted to have a quick rise on 3/7 after only 2 doses. Warfarin was held 3/7 + 3/8 evening and resumed on 3/9 and has received 5 mg, 2.5mg  since then.  INR today is therapeutic. Hgb 8.3, plts wnl  Goal of Therapy:  INR 2-3 Monitor platelets by anticoagulation protocol: Yes   Plan:  - Repeat Warfarin 2.5 mg x 1 dose at 1800 today - Daily PT/INR, CBC q72h - Will continue to monitor for any signs/symptoms of bleeding and will follow up with PT/INR in the a.m.    Thank you for allowing pharmacy to be a part of this patient's care.  [employer], Pharm.D., BCPS Clinical Pharmacist Clinical phone for 01/06/2020 from 8:30-4:00 is [phone].  **Pharmacist phone directory can be found on [email or web address] listed under [place].  01/06/2020 9:45 AM

## 2020-01-07 ENCOUNTER — Inpatient Hospital Stay (HOSPITAL_COMMUNITY): Payer: Medicare HMO

## 2020-01-07 LAB — GLUCOSE, CAPILLARY
Glucose-Capillary: 147 mg/dL — ABNORMAL HIGH (ref 70–99)
Glucose-Capillary: 223 mg/dL — ABNORMAL HIGH (ref 70–99)
Glucose-Capillary: 273 mg/dL — ABNORMAL HIGH (ref 70–99)
Glucose-Capillary: 232 mg/dL — ABNORMAL HIGH (ref 70–99)

## 2020-01-07 LAB — CBC
HCT: 24.6 % — ABNORMAL LOW (ref 39.0–52.0)
Hemoglobin: 8.2 g/dL — ABNORMAL LOW (ref 13.0–17.0)
MCH: 29.4 pg (ref 26.0–34.0)
MCHC: 33.3 g/dL (ref 30.0–36.0)
MCV: 88.2 fL (ref 80.0–100.0)
Platelets: 213 10*3/uL (ref 150–400)
RBC: 2.79 MIL/uL — ABNORMAL LOW (ref 4.22–5.81)
RDW: 16.9 % — ABNORMAL HIGH (ref 11.5–15.5)
WBC: 10.4 10*3/uL (ref 4.0–10.5)
nRBC: 0 % (ref 0.0–0.2)

## 2020-01-07 LAB — RENAL FUNCTION PANEL
Albumin: 2 g/dL — ABNORMAL LOW (ref 3.5–5.0)
Anion gap: 11 (ref 5–15)
BUN: 50 mg/dL — ABNORMAL HIGH (ref 8–23)
CO2: 24 mmol/L (ref 22–32)
Calcium: 8.2 mg/dL — ABNORMAL LOW (ref 8.9–10.3)
Chloride: 100 mmol/L (ref 98–111)
Creatinine, Ser: 6.09 mg/dL — ABNORMAL HIGH (ref 0.61–1.24)
GFR calc Af Amer: 10 mL/min — ABNORMAL LOW (ref >60)
GFR calc non Af Amer: 9 mL/min — ABNORMAL LOW (ref >60)
Glucose, Bld: 176 mg/dL — ABNORMAL HIGH (ref 70–99)
Phosphorus: 4.7 mg/dL — ABNORMAL HIGH (ref 2.5–4.6)
Potassium: 4 mmol/L (ref 3.5–5.1)
Sodium: 135 mmol/L (ref 135–145)

## 2020-01-07 LAB — PROTIME-INR
INR: 2.4 — ABNORMAL HIGH (ref 0.8–1.2)
Prothrombin Time: 26 seconds — ABNORMAL HIGH (ref 11.4–15.2)

## 2020-01-07 MED ORDER — WARFARIN SODIUM 2.5 MG PO TABS
2.5000 mg | ORAL_TABLET | Freq: Once | ORAL | Status: AC
  Administered 2020-01-07: 2.5 mg via ORAL
  Filled 2020-01-07: qty 1

## 2020-01-07 NOTE — Progress Notes (Signed)
ANTICOAGULATION CONSULT NOTE - Follow-Up  Pharmacy Consult for warfarin Indication: atrial fibrillation  No Known Allergies  Patient Measurements: Height: 5\' 11"  (180.3 cm) Weight: 167 lb 15.9 oz (76.2 kg) IBW/kg (Calculated) : 75.3  Heparin Dosing Weight: 70.6 kg  Vital Signs: Temp: 98 F (36.7 C) (03/14 0431) Temp Source: Oral (03/14 0431) BP: 118/53 (03/14 0431) Pulse Rate: 67 (03/14 0431)  Labs: Recent Labs    01/05/20 0715 01/05/20 0715 01/06/20 0517 01/07/20 0410  HGB 8.9*   < > 8.3* 8.2*  HCT 26.3*  --  24.9* 24.6*  PLT 237  --  230 213  LABPROT 25.0*  --  27.0* 26.0*  INR 2.3*  --  2.5* 2.4*  CREATININE 9.66*  --  10.10* 6.09*   < > = values in this interval not displayed.    Estimated Creatinine Clearance: 12.5 mL/min (A) (by C-G formula based on SCr of 6.09 mg/dL (H)).  Assessment: 69 yr old male presented with N/V/D X 1 week, found to have acute renal failure. Pt has hx of a fib, for which he was taking apixaban PTA (last dose on 2/27, due to concern for coffee ground emesis, which has resolved; pt was on SQ heparin). With worsening renal fxn, pharmacy was consulted to dose warfarin for Afib.  INR was noted to have a quick rise on 3/7 after only 2 doses. Warfarin was held 3/7 + 3/8 evening and resumed on 3/9 and has received 5 mg, 2.5mg  since then.  INR today is therapeutic at 2.4. No bleeding noted, Hgb 8.2, plts wnl  Goal of Therapy:  INR 2-3 Monitor platelets by anticoagulation protocol: Yes   Plan:  - Repeat Warfarin 2.5 mg x 1 dose at 1800 today - Daily PT/INR - Will continue to monitor for any signs/symptoms of bleeding and will follow up with PT/INR in the a.m.    Thank you for involving pharmacy in this patient's care.  Renold Genta, PharmD, BCPS Clinical Pharmacist Clinical phone for 01/07/2020 until 3p is [phone] 01/07/2020 8:38 AM  **Pharmacist phone directory can be found on [email or web address] listed under [place]**

## 2020-01-07 NOTE — Progress Notes (Signed)
[name] Progress Note   69 y.o.year-old with h/o pulm HTN, PAD, HTN, DCM EF 20-25% (remote echo), COPD- chronically on 4 L  presented w/ abd pain , nausea/ vomiting and diarrhea x1wk + mid upper abd pain.  In ED labs showed creat 14, K 6.3, on lasix and ace inhibitor at home.  Asked to see for AKI; cr 1.47 on 2/4/21w/ BLcr from Jun 2020-feb 2021 1.4- 1.7.   Assessment/ Plan:   AoCKD 3 - in setting of vol depletion/ vomiting/ diarrhea and ACEi. B/l creat is 1.5.urine shows 100 of protein, few RBC -  HD started on 2/28- doing PRN,  Done  3/8 and then 3/13 - (VIR) placed RIJ TC;   - - BUN and crt increasing in between HD treatments but only needed 2 times last week- Mon ans Sat. UOP seems to be increasing -  since crt pretty good 5 weeks ago need to watch a little longer- daily labs- cont to watch but also need to see if he could get AKI HD as OP if does not show signs of recovery over the next few days - have informed renal navigator to look and see if would qualify for OP HD in the setting of AKI.  Updated wife over the phone on 3/13  2. DM on insulin 3. Vol depletion - treated-  BP good on  norvasc 5 and coreg 6.25 BID 4. Lactic / metabolic acidosis - severethen resolved- worsening as renal function declines- bette today after HD.  5. COPD - on homeO2. 6. Anemia-  iron stores low, gave feraheme 3/9- second dose 3/12- started ESA 7. Bones-  Phos OK-  PTH 131 - no meds  Subjective:   HD yest, ran even- tolerated well -  UOP only 500 but foley bag is full - labs reflective of HD yesterday.  Feels no different    Objective:   BP (!) 121/57 (BP Location: Right Arm)   Pulse 68   Temp 98.2 F (36.8 C) (Oral)   Resp 20   Ht 5\' 11"  (1.803 m)   Wt 76.2 kg   SpO2 (!) 86%   BMI 23.43 kg/m   Intake/Output Summary (Last 24 hours) at 01/07/2020 1127 Last data filed at 01/07/2020 0845 Gross per 24 hour  Intake 357 ml  Output 148 ml  Net 209 ml   Weight change:    Physical Exam: Genin bedconverses, very pleasant No rash, cyanosis or gangrene No jvd or bruits Chest clear bilatto bases, no rales, +rhonchi RRR no MRG Abd soft ntnd no massor ascites ExttrLEedema, no wounds or ulcers Access: TDC GU: foley to gravity  Imaging: No results found.  Labs: BMET Recent Labs  Lab 01/01/20 0420 01/02/20 0518 01/03/20 0431 01/04/20 0438 01/05/20 0715 01/06/20 0517 01/07/20 0410  NA 136 139 136 136 137 138 135  K 3.9 4.7 3.7 4.1 4.2 3.9 4.0  CL 99 103 100 102 102 102 100  CO2 22 21* 24 20* 20* 20* 24  GLUCOSE 160* 150* 99 121* 77 106* 176*  BUN 101* 50* 72* 87* 99* 103* 50*  CREATININE 8.56* 5.37* 6.75* 8.39* 9.66* 10.10* 6.09*  CALCIUM 8.2* 8.7* 8.3* 8.5* 8.7* 8.7* 8.2*  PHOS 3.9 4.3 4.1 5.0* 5.7* 6.1* 4.7*   CBC Recent Labs  Lab 01/04/20 0438 01/05/20 0715 01/06/20 0517 01/07/20 0410  WBC 13.2* 14.1* 12.1* 10.4  HGB 8.7* 8.9* 8.3* 8.2*  HCT 26.2* 26.3* 24.9* 24.6*  MCV 87.0 87.7 87.7 88.2  PLT 204 237 230 213    Medications:    . amLODipine  5 mg Oral Daily  . carvedilol  6.25 mg Oral BID WC  . Chlorhexidine Gluconate Cloth  6 each Topical Daily  . Chlorhexidine Gluconate Cloth  6 each Topical Q0600  . coumadin book   Does not apply Once  . darbepoetin (ARANESP) injection - NON-DIALYSIS  60 mcg Subcutaneous Q Mon-1800  . feeding supplement (NEPRO CARB STEADY)  237 mL Oral TID BM  . guaiFENesin  600 mg Oral BID  . insulin aspart  0-5 Units Subcutaneous QHS  . insulin aspart  0-9 Units Subcutaneous TID WC  . insulin aspart  2 Units Subcutaneous TID WC  . insulin glargine  4 Units Subcutaneous Daily  . levETIRAcetam  500 mg Oral BID  . multivitamin  1 tablet Oral QHS  . predniSONE  20 mg Oral Q breakfast  . senna-docusate  1 tablet Oral BID  . umeclidinium-vilanterol  1 puff Inhalation Daily  . warfarin  2.5 mg Oral ONCE-1800  . Warfarin - Pharmacist Dosing Inpatient   Does not apply [place]  01/07/2020, 11:27 AM

## 2020-01-07 NOTE — Progress Notes (Signed)
PROGRESS NOTE    Kyle [name]  AVW:[id number] DOB: 11-12-1950 DOA: 12/24/2019 PCP: Alonna Buckler, MD   Brief Narrative: As pr HPI:  78 yom history of heavy smoking, COPD, peripheral vascular disease presenting with nausea, intermittent vomiting, abd cramping/pain and diarrhea for 1 week found to have acute renal failure, hyperkalemia and severe lactic acidosis of unclear etiology. Patient debated on 12/25/2019 and extubated on 12/26/2019.  Patient back on baseline O2 and following commands appropriately.  He is deconditioned weak and frail.  Subjective: Patient reports no new complaints. Overnight no fever, T-max 98.8. Labs this morning showing BUN 50, creatinine 6.0-last dialysis 3/13 No more hypoglycemia Left elbow pain is significantly improved is able to mobilize more after being on steroid and dialysis  Assessment & Plan:  Acute renal failure on CKD stage IIIa/metabolic acidosis/hyperphosphatemia: in the setting of volume depletion/vomiting diarrhea, ACE inhibitor.  Baseline creatinine 1.5.  Seen by nephrology placed on HD 2/28 last HD on 3/8, 3/13-access-right IJ TDC.Noted nephrology plan to watch closely and see if he could get AKI HD as outpatient if he does not show signs of recovery over the weekend, renal navigator working on setting of HD.   Acute metabolic encephalopathy;?  Etiology likely secondary to seizure in the setting of significant uremia.  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  Had ammonia level high at 56.  Initially needed intubation subsequently extubated 3/2, CT head and MRI head no acute intracranial finding.  EEG was negative.  Seen by neurology, placed on Keppra twice daily.  Patient is alert awake oriented.  Mental status has improved.  Continue supportive care.   Chronic hypoxic respiratory failure/COPD on home oxygen 4 to 5 L nasal cannula.  Continue the same.  Continue pulmonary support, bronchodilators and ambulation.  Intermittently patient is needing more  oxygen.  This morning on 3.5 L 86% saturating can increase oxygen slowly for goal of 91%, no significant crackles on exam.  Obtain chest x-ray.  Left elbow pain/swelling-?gout w AKI/CKD related-swelling, significantly improving on prednisone 20 mg continue x5 days. X-ray unremarkable uric acid normal.Elevate the left arm.    Hypertension: Blood pressure fairly stable continue Norvasc and Coreg.  PAF/dilated cardiomyopathy/aortic stenosis/peripheral vascular disease: Patient has been lost to follow-up with cardiology in the past, with medication noncompliance.  Patient is reportedly was compliant with his medication at home recently.  On apixaban prior to admission now transitioned to Coumadin the setting of his acute kidney injury.Continue to monitor INR and pharmacy dosing.  Continue Coreg.  Hyperkalemia resolved.  Lactic acidosis/metabolic acidosis severe but resolved currently.  Vomiting diarrhea and abdominal pain/ volume depletion treated, currently stable.  CT abdomen and pelvis with renal stone protocol no source of patient's abdominal pain.  Overall symptoms have resolved.   Could be possibly from ischemic colitis per pccm.  Coffee-ground emesis H&H stable.monitor Recent Labs  Lab 01/03/20 0431 01/04/20 0438 01/05/20 0715 01/06/20 0517 01/07/20 0410  HGB 9.5* 8.7* 8.9* 8.3* 8.2*  HCT 28.5* 26.2* 26.3* 24.9* 24.6*   Anemia with low iron storage:gave Feraheme 3/9 and also started on ESA.  Appreciate nephrology input.  Right lower lung nodule 5 mm repeat CT scan in 12 months will be needed.  Follow-up with pulmonary outpatient.  Insulin-dependent diabetes mellitus A1c 8.0.Blood sugar was low in 50s/hypoglycemic likely due to  worsening renal failure and decreased insulin clearance.  Have cut down Lantus further 4 units daily, 2 units premeal insulin and sliding scale insulin and monitor. Recent Labs  Lab 01/06/20 0720 01/06/20 1136 01/06/20 1734 01/06/20 2102 01/07/20 0646   GLUCAP 85 228* 79 177* 147*   Nutrition: Nutrition Problem: Inadequate oral intake Etiology: inability to eat Signs/Symptoms: NPO status Interventions: MVI, Nepro shake Body mass index is 23.43 kg/m.   DVT prophylaxis:warfarin Code Status:FULL Family Communication: plan of care discussed with patient at bedside. Disposition Plan: Patient is from:home Anticipated Disposition: to HHPT/Supervision for mobility Barriers to discharge or conditions that needs to be met prior to discharge: Patient remains in the hospital with acute renal failure, monitor renal functions for recovery and diaysis need.Home once signed off by nephrology  Consultants:   Interventional radiology: Dr. Earleen Newport  Neurology: Dr. Cheral Marker 12/24/2019  Nephrology: Dr. Jonnie Finner 12/24/2019  [facility] hospitalist: Dr. Lorin Mercy 12/24/2019  Procedures:   CT renal stone protocol 12/24/2019  Chest x-ray 12/24/2019, 12/25/2019  MRI brain MRA head 12/24/2019  Tunneled central venous HD catheter placement per IR 12/28/2019  Renal ultrasound 12/24/2019  EEG 12/24/2019--- suggestive of moderate to severe diffuse encephalopathy, nonspecific etiology.  Right IJ HD cath 12/24/2019  ETT 12/25/2019>>>> 12/26/2019  Microbiology:see note  Medications: Scheduled Meds: . amLODipine  5 mg Oral Daily  . carvedilol  6.25 mg Oral BID WC  . Chlorhexidine Gluconate Cloth  6 each Topical Daily  . Chlorhexidine Gluconate Cloth  6 each Topical Q0600  . coumadin book   Does not apply Once  . darbepoetin (ARANESP) injection - NON-DIALYSIS  60 mcg Subcutaneous Q Mon-1800  . feeding supplement (NEPRO CARB STEADY)  237 mL Oral TID BM  . guaiFENesin  600 mg Oral BID  . insulin aspart  0-5 Units Subcutaneous QHS  . insulin aspart  0-9 Units Subcutaneous TID WC  . insulin aspart  2 Units Subcutaneous TID WC  . insulin glargine  4 Units Subcutaneous Daily  . levETIRAcetam  500 mg Oral BID  . multivitamin  1 tablet Oral QHS  . predniSONE  20 mg Oral Q  breakfast  . senna-docusate  1 tablet Oral BID  . umeclidinium-vilanterol  1 puff Inhalation Daily  . Warfarin - Pharmacist Dosing Inpatient   Does not apply q1800   Continuous Infusions: . sodium chloride 250 mL (12/29/19 0532)    Antimicrobials: Anti-infectives (From admission, onward)   Start     Dose/Rate Route Frequency Ordered Stop   12/28/19 1400  ceFAZolin (ANCEF) IVPB 2g/100 mL premix     2 g 200 mL/hr over 30 Minutes Intravenous  Once 12/28/19 1347 12/28/19 1533       Objective: Vitals: Today's Vitals   01/06/20 1947 01/06/20 2106 01/07/20 0431 01/07/20 0808  BP:  (!) 143/80 (!) 118/53   Pulse:  87 67   Resp:  16 18   Temp:  98.2 F (36.8 C) 98 F (36.7 C)   TempSrc:  Oral Oral   SpO2:  (!) 89% 92% 91%  Weight:  76.2 kg    Height:      PainSc: 0-No pain       Intake/Output Summary (Last 24 hours) at 01/07/2020 0819 Last data filed at 01/07/2020 0559 Gross per 24 hour  Intake 603.85 ml  Output 498 ml  Net 105.85 ml   Filed Weights   01/06/20 1305 01/06/20 1700 01/06/20 2106  Weight: 78.5 kg 78.3 kg 76.2 kg   Weight change:    Intake/Output from previous day: 03/13 0701 - 03/14 0700 In: 603.9 [P.O.:600; I.V.:3.9] Out: 498 [Urine:500] Intake/Output this shift: No intake/output data recorded.  Examination:  General exam: AAO  X3, not in acute distress, weak and frail. HEENT:Oral mucosa moist, Ear/Nose WNL grossly,dentition normal. Respiratory system: bilaterally diminished breath sound,no use of accessory muscle, non tender. Cardiovascular system: S1 & S2 +, regular, No JVD. Gastrointestinal system: Abdomen soft, NT,ND, BS+. Nervous System:Alert, awake, moving extremities and grossly nonfocal Extremities: Left elbow mildly swollen less tender increase ROM,  distal peripheral pulses palpable.  Skin: No rashes,no icterus. MSK: Normal muscle bulk,tone, power RT CHEST W/ HD catheter+  Data Reviewed: I have personally reviewed following labs and  imaging studies CBC: Recent Labs  Lab 01/03/20 0431 01/04/20 0438 01/05/20 0715 01/06/20 0517 01/07/20 0410  WBC 11.0* 13.2* 14.1* 12.1* 10.4  HGB 9.5* 8.7* 8.9* 8.3* 8.2*  HCT 28.5* 26.2* 26.3* 24.9* 24.6*  MCV 89.1 87.0 87.7 87.7 88.2  PLT 245 204 237 230 102   Basic Metabolic Panel: Recent Labs  Lab 01/03/20 0431 01/04/20 0438 01/05/20 0715 01/06/20 0517 01/07/20 0410  NA 136 136 137 138 135  K 3.7 4.1 4.2 3.9 4.0  CL 100 102 102 102 100  CO2 24 20* 20* 20* 24  GLUCOSE 99 121* 77 106* 176*  BUN 72* 87* 99* 103* 50*  CREATININE 6.75* 8.39* 9.66* 10.10* 6.09*  CALCIUM 8.3* 8.5* 8.7* 8.7* 8.2*  PHOS 4.1 5.0* 5.7* 6.1* 4.7*   GFR: Estimated Creatinine Clearance: 12.5 mL/min (A) (by C-G formula based on SCr of 6.09 mg/dL (H)). Liver Function Tests: Recent Labs  Lab 01/03/20 0431 01/04/20 0438 01/05/20 0715 01/06/20 0517 01/07/20 0410  ALBUMIN 2.4* 2.1* 2.2* 2.2* 2.0*   No results for input(s): LIPASE, AMYLASE in the last 168 hours. No results for input(s): AMMONIA in the last 168 hours. Coagulation Profile: Recent Labs  Lab 01/03/20 0431 01/04/20 0438 01/05/20 0715 01/06/20 0517 01/07/20 0410  INR 1.7* 2.2* 2.3* 2.5* 2.4*   Cardiac Enzymes: No results for input(s): CKTOTAL, CKMB, CKMBINDEX, TROPONINI in the last 168 hours. BNP (last 3 results) No results for input(s): PROBNP in the last 8760 hours. HbA1C: No results for input(s): HGBA1C in the last 72 hours. CBG: Recent Labs  Lab 01/06/20 0720 01/06/20 1136 01/06/20 1734 01/06/20 2102 01/07/20 0646  GLUCAP 85 228* 79 177* 147*   Lipid Profile: No results for input(s): CHOL, HDL, LDLCALC, TRIG, CHOLHDL, LDLDIRECT in the last 72 hours. Thyroid Function Tests: No results for input(s): TSH, T4TOTAL, FREET4, T3FREE, THYROIDAB in the last 72 hours. Anemia Panel: No results for input(s): VITAMINB12, FOLATE, FERRITIN, TIBC, IRON, RETICCTPCT in the last 72 hours. Sepsis Labs: No results for  input(s): PROCALCITON, LATICACIDVEN in the last 168 hours.  No results found for this or any previous visit (from the past 240 hour(s)).    Radiology Studies: No results found.   LOS: 14 days   Time spent: More than 50% of that time was spent in counseling and/or coordination of care.  Antonieta Pert, MD [facility]  01/07/2020, 8:19 AM

## 2020-01-08 LAB — RENAL FUNCTION PANEL
Albumin: 2.2 g/dL — ABNORMAL LOW (ref 3.5–5.0)
Anion gap: 13 (ref 5–15)
BUN: 64 mg/dL — ABNORMAL HIGH (ref 8–23)
CO2: 24 mmol/L (ref 22–32)
Calcium: 8.3 mg/dL — ABNORMAL LOW (ref 8.9–10.3)
Chloride: 104 mmol/L (ref 98–111)
Creatinine, Ser: 7.1 mg/dL — ABNORMAL HIGH (ref 0.61–1.24)
GFR calc Af Amer: 8 mL/min — ABNORMAL LOW (ref >60)
GFR calc non Af Amer: 7 mL/min — ABNORMAL LOW (ref >60)
Glucose, Bld: 179 mg/dL — ABNORMAL HIGH (ref 70–99)
Phosphorus: 5.2 mg/dL — ABNORMAL HIGH (ref 2.5–4.6)
Potassium: 3.3 mmol/L — ABNORMAL LOW (ref 3.5–5.1)
Sodium: 141 mmol/L (ref 135–145)

## 2020-01-08 LAB — PROTIME-INR
INR: 2.6 — ABNORMAL HIGH (ref 0.8–1.2)
Prothrombin Time: 27.5 seconds — ABNORMAL HIGH (ref 11.4–15.2)

## 2020-01-08 LAB — GLUCOSE, CAPILLARY
Glucose-Capillary: 118 mg/dL — ABNORMAL HIGH (ref 70–99)
Glucose-Capillary: 154 mg/dL — ABNORMAL HIGH (ref 70–99)
Glucose-Capillary: 310 mg/dL — ABNORMAL HIGH (ref 70–99)
Glucose-Capillary: 239 mg/dL — ABNORMAL HIGH (ref 70–99)

## 2020-01-08 MED ORDER — WARFARIN SODIUM 2.5 MG PO TABS
2.5000 mg | ORAL_TABLET | Freq: Once | ORAL | Status: AC
  Administered 2020-01-08: 2.5 mg via ORAL
  Filled 2020-01-08: qty 1

## 2020-01-08 NOTE — Plan of Care (Signed)
  Problem: Activity: Goal: Risk for activity intolerance will decrease Outcome: Progressing   

## 2020-01-08 NOTE — Progress Notes (Addendum)
Occupational Therapy Treatment Patient Details Name: Kyle [name] MRN: [id number] DOB: 11-15-1950 Today's Date: 01/08/2020    History of present illness 69 y.o. M with significant history of heavy smoking, COPD, peripheral vascular disease presenting with nausea, intermittent vomiting, abd cramping/pain and diarrhea for 1 week found to have acute renal failure, hyperkalemia and severe lactic acidosis of unclear etiology. Intubated 12/25/2019-12/26/2019.   OT comments  Pt making progress with functional goals. Pt in bed upon arrival and eager to work with therapy. O2 SATs 83-84 at rest, increased OT to 6L and pt sat EOB with SATs dropping to upper 70s and pt instructed on deep breathing until SATs recoverd to 88-89%. During mobility with RW, pt SATs in upper 80s-90s. Session focused on sit - stand transitions, UB and LB bathing/dressing, grooming standing at sink , ADL mobility safety using RW. Pt positioned in recliner at end of session and O2 returned to 5L. No c/o of UE pain this session. OT will continue to follow acutely  Follow Up Recommendations  Home health OT;Supervision - Intermittent    Equipment Recommendations  Tub/shower bench    Recommendations for Other Services      Precautions / Restrictions Precautions Precautions: Fall Precaution Comments: monitor for impulsivity Restrictions Weight Bearing Restrictions: No       Mobility Bed Mobility Overal bed mobility: Needs Assistance Bed Mobility: Supine to Sit     Supine to sit: Min guard        Transfers Overall transfer level: Needs assistance   Transfers: Sit to/from Stand Sit to Stand: Min guard Stand pivot transfers: Min guard            Balance Overall balance assessment: Needs assistance Sitting-balance support: Feet supported Sitting balance-Leahy Scale: Good     Standing balance support: Single extremity supported;During functional activity;Bilateral upper extremity supported Standing  balance-Leahy Scale: Fair                             ADL either performed or assessed with clinical judgement   ADL Overall ADL's : Needs assistance/impaired Eating/Feeding: Set up;Independent;Sitting   Grooming: Wash/dry hands;Wash/dry face;Standing;Min guard   Upper Body Bathing: Set up;Supervision/ safety;Sitting   Lower Body Bathing: Set up;Supervison/ safety;Min guard;Sitting/lateral leans;Sit to/from stand   Upper Body Dressing : Set up;Supervision/safety;Sitting   Lower Body Dressing: Min guard;Sit to/from stand   Toilet Transfer: Min guard;Ambulation;RW;Cueing for safety   Toileting- Clothing Manipulation and Hygiene: Min guard;Sit to/from stand       Functional mobility during ADLs: Min guard;Rolling walker;Cueing for safety General ADL Comments: pt on 5 L O2 supine in bes upon arrival. O2 SATs 83-84 at rest, increased OT to 6L and pt dat EOB with SATs dropping to upper 70s and pt instructed on deep breathing until SATs recoverd to 88-89%     Vision Baseline Vision/History: Wears glasses Wears Glasses: Reading only Patient Visual Report: No change from baseline     Perception     Praxis      Cognition Arousal/Alertness: Awake/alert Behavior During Therapy: WFL for tasks assessed/performed Overall Cognitive Status: Within Functional Limits for tasks assessed                                          Exercises     Shoulder Instructions       General Comments  Pertinent Vitals/ Pain       Pain Assessment: No/denies pain Faces Pain Scale: No hurt Pain Intervention(s): Monitored during session  Home Living                                          Prior Functioning/Environment              Frequency  Min 2X/week        Progress Toward Goals  OT Goals(current goals can now be found in the care plan section)  Progress towards OT goals: Progressing toward goals  Acute Rehab OT  Goals Patient Stated Goal: go home  Plan Discharge plan remains appropriate    Co-evaluation    PT/OT/SLP Co-Evaluation/Treatment: Yes Reason for Co-Treatment: For patient/therapist safety;To address functional/ADL transfers;Other (comment)(pt tends to de sat quickly)   OT goals addressed during session: ADL's and self-care;Proper use of Adaptive equipment and DME      AM-PAC OT "6 Clicks" Daily Activity     Outcome Measure   Help from another person eating meals?: None Help from another person taking care of personal grooming?: A Little Help from another person toileting, which includes using toliet, bedpan, or urinal?: A Little Help from another person bathing (including washing, rinsing, drying)?: A Little Help from another person to put on and taking off regular upper body clothing?: A Little Help from another person to put on and taking off regular lower body clothing?: A Lot 6 Click Score: 18    End of Session Equipment Utilized During Treatment: Rolling walker;Gait belt;Oxygen  OT Visit Diagnosis: Unsteadiness on feet (R26.81)   Activity Tolerance Patient tolerated treatment well   Patient Left in chair;with call bell/phone within reach;with chair alarm set   Nurse Communication          Time: 0962-8366 OT Time Calculation (min): 34 min  Charges: OT General Charges $OT Visit: 1 Visit OT Treatments $Self Care/Home Management : 8-22 mins     Emmit Alexanders [facility] 01/08/2020, 2:05 PM

## 2020-01-08 NOTE — Progress Notes (Signed)
[facility] Progress Note   Assessment/ Plan:   AoCKD 3 - in setting of vol depletion/ vomiting/ diarrhea and ACEi. B/l creat is 1.5.urine shows 100 of protein, few RBC -  HD started on 2/28- doing PRN,  Done  3/8 and then 3/13 -(VIR) placed RIJ TC;   - - BUN and crt increasing in between HD treatments but only needed 2 times last week- Mon ans Sat. UOP seems to be increasing -  since crt pretty good 5 weeks ago need to watch a little longer- daily labs- cont to watch but also need to see if he could get AKI HD as OP if does not show signs of recovery over the next few days.  Not uremic, no indication for HD today- will wait and watch and see if he needs it tomorrow 3/16.  AKI seat in process  2. DM on insulin 3. Vol depletion -treated-  BP good on  norvasc 5 and coreg 6.25 BID 4. Lactic / metabolic acidosis - severethen resolved- worsening as renal function declines- better after HD.  5. COPD - on homeO2. 6. Anemia-  iron stores low, gave feraheme 3/9- second dose 3/12- started ESA 7. Bones-  Phos OK-  PTH 131 - no meds  Subjective:    UOP 900, Cr up from 6-->7.10 today.  Not uremic, hopeful for renal recovery, updated pt at bedside and wife over the phone   Objective:   BP (!) 131/59 (BP Location: Right Arm)   Pulse 60   Temp 97.6 F (36.4 C) (Oral)   Resp 18   Ht 5\' 11"  (1.803 m)   Wt 76.2 kg   SpO2 92%   BMI 23.43 kg/m   Physical Exam: Gen: NAD CVS: RRR Resp: clear bilaterally Abd: soft Ext: no LE edema ACCESS: R IJ TDC  Labs: BMET Recent Labs  Lab 01/02/20 0518 01/03/20 0431 01/04/20 0438 01/05/20 0715 01/06/20 0517 01/07/20 0410 01/08/20 1012  NA 139 136 136 137 138 135 141  K 4.7 3.7 4.1 4.2 3.9 4.0 3.3*  CL 103 100 102 102 102 100 104  CO2 21* 24 20* 20* 20* 24 24  GLUCOSE 150* 99 121* 77 106* 176* 179*  BUN 50* 72* 87* 99* 103* 50* 64*  CREATININE 5.37* 6.75* 8.39* 9.66* 10.10* 6.09* 7.10*  CALCIUM 8.7* 8.3* 8.5* 8.7* 8.7*  8.2* 8.3*  PHOS 4.3 4.1 5.0* 5.7* 6.1* 4.7* 5.2*   CBC Recent Labs  Lab 01/04/20 0438 01/05/20 0715 01/06/20 0517 01/07/20 0410  WBC 13.2* 14.1* 12.1* 10.4  HGB 8.7* 8.9* 8.3* 8.2*  HCT 26.2* 26.3* 24.9* 24.6*  MCV 87.0 87.7 87.7 88.2  PLT 204 237 230 213      Medications:    . amLODipine  5 mg Oral Daily  . carvedilol  6.25 mg Oral BID WC  . Chlorhexidine Gluconate Cloth  6 each Topical Daily  . Chlorhexidine Gluconate Cloth  6 each Topical Q0600  . coumadin book   Does not apply Once  . darbepoetin (ARANESP) injection - NON-DIALYSIS  60 mcg Subcutaneous Q Mon-1800  . feeding supplement (NEPRO CARB STEADY)  237 mL Oral TID BM  . guaiFENesin  600 mg Oral BID  . insulin aspart  0-5 Units Subcutaneous QHS  . insulin aspart  0-9 Units Subcutaneous TID WC  . insulin aspart  2 Units Subcutaneous TID WC  . insulin glargine  4 Units Subcutaneous Daily  . levETIRAcetam  500 mg Oral BID  . multivitamin  1 tablet Oral QHS  . predniSONE  20 mg Oral Q breakfast  . senna-docusate  1 tablet Oral BID  . umeclidinium-vilanterol  1 puff Inhalation Daily  . warfarin  2.5 mg Oral ONCE-1800  . Warfarin - Pharmacist Dosing Inpatient   Does not apply q1800     Madelon Lips, MD 01/08/2020, 12:01 PM

## 2020-01-08 NOTE — Progress Notes (Signed)
PROGRESS NOTE    Kyle [name]  MRN:[id number] DOB: 08/21/1951 DOA: 12/24/2019 PCP: Morgan, Melvin K II, MD   Brief Narrative: As pr HPI:  68 yom history of heavy smoking, COPD, peripheral vascular disease presenting with nausea, intermittent vomiting, abd cramping/pain and diarrhea for 1 week found to have acute renal failure, hyperkalemia and severe lactic acidosis of unclear etiology. Patient was intubated on 12/25/2019 and extubated on 12/26/2019.  Patient back on baseline O2 and following commands appropriately.  He is deconditioned weak and frail.  Subjective:  Resting comfortably this morning has no new complaints.  Denies shortness of breath.   Left elbow pain is less.   Afebrile overnight. Had urine output about 900 mL.  Assessment & Plan:  Acute renal failure on CKD stage IIIa/metabolic acidosis/hyperphosphatemia: in the setting of volume depletion/vomiting diarrhea, ACE inhibitor.  Baseline creatinine 1.5.  Seen by nephrology placed on HD 2/28 last HD on 3/8, 3/13-access-right IJ TDC.Noted nephrology plan to watch closely and see if he could get AKI HD as outpatient if he does not show signs of recovery soon, renal navigator working on setting up HD.  Discussed with Dr. Uptonr from nephrology today. Recent Labs  Lab 01/04/20 0438 01/05/20 0715 01/06/20 0517 01/07/20 0410 01/08/20 1012  BUN 87* 99* 103* 50* 64*  CREATININE 8.39* 9.66* 10.10* 6.09* 7.10*   Acute metabolic encephalopathy;?  Etiology likely secondary to seizure in the setting of significant uremia.  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  Had ammonia level high at 56.  Initially needed intubation subsequently extubated 3/2, CT head and MRI head no acute intracranial finding.  EEG was negative.  Seen by neurology, placed on Keppra twice daily.  Patient is alert awake oriented.  Mental status has improved.  Continue supportive care.  Chronic hypoxic respiratory failure/COPD on home oxygen 4 to 5 L nasal cannula.   Continue the same.  Continue pulmonary support, bronchodilators and ambulation.  Intermittently patient is needing more oxygen.  This morning on 3.5 L 86% saturating can increase oxygen slowly for goal of 91%, no significant crackles on exam. chest x-ray 3/14-.  Left elbow pain/swelling-?gout w AKI/CKD related-swelling, significantly improving on prednisone 20 mg continue x5 days. X-ray unremarkable uric acid normal.Elevate the left arm.    Hypertension: Blood pressure fairly stable continue Norvasc and Coreg.  PAF/dilated cardiomyopathy/aortic stenosis/peripheral vascular disease: Patient has been lost to follow-up with cardiology in the past, with medication noncompliance.  Patient is reportedly was compliant with his medication at home recently.  On apixaban prior to admission now transitioned to Coumadin the setting of his acute kidney injury.Continue to monitor INR and pharmacy dosing.  Continue Coreg. INR therapeutic 2.6.  Hyperkalemia resolved.  Lactic acidosis/metabolic acidosis severe but resolved currently.  Vomiting diarrhea and abdominal pain/ volume depletion treated, currently stable.  CT abdomen and pelvis with renal stone protocol no source of patient's abdominal pain.  Overall symptoms have resolved.   Could be possibly from ischemic colitis per pccm.  Coffee-ground emesis H&H stable.monitor Recent Labs  Lab 01/03/20 0431 01/04/20 0438 01/05/20 0715 01/06/20 0517 01/07/20 0410  HGB 9.5* 8.7* 8.9* 8.3* 8.2*  HCT 28.5* 26.2* 26.3* 24.9* 24.6*   Anemia with low iron storage:gave Feraheme 3/9 and also started on ESA.  Appreciate nephrology input.  Right lower lung nodule 5 mm repeat CT scan in 12 months will be needed.  Follow-up with pulmonary outpatient.  Insulin-dependent diabetes mellitus A1c 8.0.Blood sugar was low in 50s/hypoglycemic previously likely due to    worsening renal failure and decreased insulin clearance.  Sugar stable after cutting down Lantus further to 4  units daily, 2 units premeal insulin and sliding scale insulin and monitor. Recent Labs  Lab 01/07/20 1200 01/07/20 1717 01/07/20 2117 01/08/20 0647 01/08/20 1123  GLUCAP 223* 273* 232* 118* 154*   Nutrition: Nutrition Problem: Inadequate oral intake Etiology: inability to eat Signs/Symptoms: NPO status Interventions: MVI, Nepro shake Body mass index is 23.43 kg/m.   DVT prophylaxis:warfarin Code Status:FULL Family Communication: plan of care discussed with patient at bedside. Disposition Plan: Patient is from:home Anticipated Disposition: to HHPT/Supervision for mobility Barriers to discharge or conditions that needs to be met prior to discharge: Patient remains in the hospital with acute renal failure, monitor renal functions for recovery and diaysis need.BUN creatinine worsens whle not getting dialysis, nephrology actively monitoring for need for outpatient AKI HD. Home once cleared by nephrology.  Consultants:   Interventional radiology: Dr. Wagner  Neurology: Dr. Lindzen 12/24/2019  Nephrology: Dr. Schertz 12/24/2019  [facility] hospitalist: Dr. Yates 12/24/2019  Procedures:   CT renal stone protocol 12/24/2019  Chest x-ray 12/24/2019, 12/25/2019  MRI brain MRA head 12/24/2019  Tunneled central venous HD catheter placement per IR 12/28/2019  Renal ultrasound 12/24/2019  EEG 12/24/2019--- suggestive of moderate to severe diffuse encephalopathy, nonspecific etiology.  Right IJ HD cath 12/24/2019  ETT 12/25/2019>>>> 12/26/2019  Microbiology:see note  Medications: Scheduled Meds: . amLODipine  5 mg Oral Daily  . carvedilol  6.25 mg Oral BID WC  . Chlorhexidine Gluconate Cloth  6 each Topical Daily  . Chlorhexidine Gluconate Cloth  6 each Topical Q0600  . coumadin book   Does not apply Once  . darbepoetin (ARANESP) injection - NON-DIALYSIS  60 mcg Subcutaneous Q Mon-1800  . feeding supplement (NEPRO CARB STEADY)  237 mL Oral TID BM  . guaiFENesin  600 mg Oral BID  .  insulin aspart  0-5 Units Subcutaneous QHS  . insulin aspart  0-9 Units Subcutaneous TID WC  . insulin aspart  2 Units Subcutaneous TID WC  . insulin glargine  4 Units Subcutaneous Daily  . levETIRAcetam  500 mg Oral BID  . multivitamin  1 tablet Oral QHS  . predniSONE  20 mg Oral Q breakfast  . senna-docusate  1 tablet Oral BID  . umeclidinium-vilanterol  1 puff Inhalation Daily  . warfarin  2.5 mg Oral ONCE-1800  . Warfarin - Pharmacist Dosing Inpatient   Does not apply q1800   Continuous Infusions: . sodium chloride 250 mL (12/29/19 0532)    Antimicrobials: Anti-infectives (From admission, onward)   Start     Dose/Rate Route Frequency Ordered Stop   12/28/19 1400  ceFAZolin (ANCEF) IVPB 2g/100 mL premix     2 g 200 mL/hr over 30 Minutes Intravenous  Once 12/28/19 1347 12/28/19 1533       Objective: Vitals: Today's Vitals   01/08/20 0442 01/08/20 0808 01/08/20 0824 01/08/20 1006  BP: (!) 143/70   (!) 131/59  Pulse: 68   60  Resp: 18   18  Temp: (!) 97.5 F (36.4 C)   97.6 F (36.4 C)  TempSrc: Oral   Oral  SpO2: 92% 92%  92%  Weight:      Height:      PainSc:   0-No pain     Intake/Output Summary (Last 24 hours) at 01/08/2020 1148 Last data filed at 01/08/2020 0551 Gross per 24 hour  Intake 1197 ml  Output 900 ml  Net 297 ml     Filed Weights   01/06/20 1700 01/06/20 2106 01/07/20 1954  Weight: 78.3 kg 76.2 kg 76.2 kg   Weight change: -2.3 kg   Intake/Output from previous day: 03/14 0701 - 03/15 0700 In: 3354 [P.O.:1080; NG/GT:474] Out: 900 [Urine:900] Intake/Output this shift: No intake/output data recorded.  Examination:  General exam: AAO , NAD, weak appearing. HEENT:Oral mucosa moist, Ear/Nose WNL grossly, dentition normal. Respiratory system: bilaterally mild basal crackles, diminished breath sounds,no use of accessory muscle Cardiovascular system: S1 & S2 +, No JVD. Gastrointestinal system: Abdomen soft, NT,ND, BS+ Nervous System:Alert, awake,  moving extremities and grossly nonfocal. Extremities: No edema, distal peripheral pulses palpable.  Mild elbow swelling and tenderness present. Skin: No rashes,no icterus. MSK: Normal muscle bulk,tone, power Rt IJ HD cath present.  Data Reviewed: I have personally reviewed following labs and imaging studies CBC: Recent Labs  Lab 01/03/20 0431 01/04/20 0438 01/05/20 0715 01/06/20 0517 01/07/20 0410  WBC 11.0* 13.2* 14.1* 12.1* 10.4  HGB 9.5* 8.7* 8.9* 8.3* 8.2*  HCT 28.5* 26.2* 26.3* 24.9* 24.6*  MCV 89.1 87.0 87.7 87.7 88.2  PLT 245 204 237 230 562   Basic Metabolic Panel: Recent Labs  Lab 01/04/20 0438 01/05/20 0715 01/06/20 0517 01/07/20 0410 01/08/20 1012  NA 136 137 138 135 141  K 4.1 4.2 3.9 4.0 3.3*  CL 102 102 102 100 104  CO2 20* 20* 20* 24 24  GLUCOSE 121* 77 106* 176* 179*  BUN 87* 99* 103* 50* 64*  CREATININE 8.39* 9.66* 10.10* 6.09* 7.10*  CALCIUM 8.5* 8.7* 8.7* 8.2* 8.3*  PHOS 5.0* 5.7* 6.1* 4.7* 5.2*   GFR: Estimated Creatinine Clearance: 10.8 mL/min (A) (by C-G formula based on SCr of 7.1 mg/dL (H)). Liver Function Tests: Recent Labs  Lab 01/04/20 0438 01/05/20 0715 01/06/20 0517 01/07/20 0410 01/08/20 1012  ALBUMIN 2.1* 2.2* 2.2* 2.0* 2.2*   No results for input(s): LIPASE, AMYLASE in the last 168 hours. No results for input(s): AMMONIA in the last 168 hours. Coagulation Profile: Recent Labs  Lab 01/04/20 0438 01/05/20 0715 01/06/20 0517 01/07/20 0410 01/08/20 1012  INR 2.2* 2.3* 2.5* 2.4* 2.6*   Cardiac Enzymes: No results for input(s): CKTOTAL, CKMB, CKMBINDEX, TROPONINI in the last 168 hours. BNP (last 3 results) No results for input(s): PROBNP in the last 8760 hours. HbA1C: No results for input(s): HGBA1C in the last 72 hours. CBG: Recent Labs  Lab 01/07/20 1200 01/07/20 1717 01/07/20 2117 01/08/20 0647 01/08/20 1123  GLUCAP 223* 273* 232* 118* 154*   Lipid Profile: No results for input(s): CHOL, HDL, LDLCALC, TRIG,  CHOLHDL, LDLDIRECT in the last 72 hours. Thyroid Function Tests: No results for input(s): TSH, T4TOTAL, FREET4, T3FREE, THYROIDAB in the last 72 hours. Anemia Panel: No results for input(s): VITAMINB12, FOLATE, FERRITIN, TIBC, IRON, RETICCTPCT in the last 72 hours. Sepsis Labs: No results for input(s): PROCALCITON, LATICACIDVEN in the last 168 hours.  No results found for this or any previous visit (from the past 240 hour(s)).    Radiology Studies: DG Chest 1 View  Result Date: 01/07/2020 CLINICAL DATA:  COPD EXAM: CHEST  1 VIEW COMPARISON:  12/25/2019 FINDINGS: Mild cardiomegaly. Large-bore right neck multi lumen vascular catheter. Mild, diffuse interstitial pulmonary opacity. Probable small, persistent layering pleural effusions. Underlying emphysema. The visualized skeletal structures are unremarkable. IMPRESSION: 1. Mild cardiomegaly with mild interstitial pulmonary edema and effusions. 2.  Emphysema (ICD10-J43.9). Electronically Signed   By: Eddie Candle M.D.   On: 01/07/2020 11:51     LOS: 15 days  Time spent: More than 50% of that time was spent in counseling and/or coordination of care.  Ramesh KC, MD [facility]  01/08/2020, 11:48 AM   

## 2020-01-08 NOTE — Progress Notes (Addendum)
ANTICOAGULATION CONSULT NOTE - Follow-Up  Pharmacy Consult for warfarin Indication: atrial fibrillation  No Known Allergies  Patient Measurements: Height: 5\' 11"  (180.3 cm) Weight: 167 lb 15.9 oz (76.2 kg) IBW/kg (Calculated) : 75.3  Heparin Dosing Weight: 70.6 kg  Vital Signs: Temp: 97.6 F (36.4 C) (03/15 1006) Temp Source: Oral (03/15 1006) BP: 131/59 (03/15 1006) Pulse Rate: 60 (03/15 1006)  Labs: Recent Labs    01/06/20 0517 01/07/20 0410 01/08/20 1012  HGB 8.3* 8.2*  --   HCT 24.9* 24.6*  --   PLT 230 213  --   LABPROT 27.0* 26.0* 27.5*  INR 2.5* 2.4* 2.6*  CREATININE 10.10* 6.09* 7.10*    Estimated Creatinine Clearance: 10.8 mL/min (A) (by C-G formula based on SCr of 7.1 mg/dL (H)).  Assessment: 69 yr old M on apixaban PTA for afib (CHADS2VASc = 3), last apixaban dose on 2/27, due to concern for coffee ground emesis, which has resolved. With worsening renal fxn, pharmacy was consulted to dose warfarin for Afib.  INR 2.6 therapeutic. H/H, plt stable. No bleeding per RN.  Goal of Therapy:  INR 2-3 Monitor platelets by anticoagulation protocol: Yes   Plan:  Warfarin 2.5 mg x 1  Monitor daily INR, CBC/plt Monitor for signs/symptoms of bleeding    Benetta Spar, PharmD, BCPS, BCCP Clinical Pharmacist  Please check AMION for all [place] phone numbers After 10:00 PM, call [place] [phone]

## 2020-01-08 NOTE — Progress Notes (Signed)
Physical Therapy Treatment Patient Details Name: Kyle [name] MRN: [id number] DOB: 31-Jul-1951 Today's Date: 01/08/2020    History of Present Illness 69 y.o. M with significant history of heavy smoking, COPD, peripheral vascular disease presenting with nausea, intermittent vomiting, abd cramping/pain and diarrhea for 1 week found to have acute renal failure, hyperkalemia and severe lactic acidosis of unclear etiology. Intubated 12/25/2019-12/26/2019.    PT Comments    Pt was seen with supplemental O2 for this visit, noting his O2 sats on 5L were 85% on the bed and sitting down to 77%.  Recovered level with a set of cues for breathing deeply but could not get past 87%.  His O2 was elevated to 6L and walked 100' total on the hallway with one sitting rest 1/3 of the way.  His O2 sats were 92% with effort, able to get as high as 93% at one point on the hall.  Up in chair with his meal after therapy and has call light in place.  See pt as needed pre-discharge to assist him with management of his sats with activity.     Follow Up Recommendations  Home health PT;Supervision/Assistance - 24 hour     Equipment Recommendations  Rolling walker with 5" wheels    Recommendations for Other Services       Precautions / Restrictions Precautions Precautions: Fall Precaution Comments: monitor for impulsivity Restrictions Weight Bearing Restrictions: No    Mobility  Bed Mobility Overal bed mobility: Needs Assistance Bed Mobility: Supine to Sit     Supine to sit: Min guard     General bed mobility comments: pt remained up in chair after therapy  Transfers Overall transfer level: Needs assistance Equipment used: Rolling walker (2 wheeled);1 person hand held assist Transfers: Sit to/from Stand Sit to Stand: Min guard Stand pivot transfers: Min guard       General transfer comment: min guard and ck of O2 sats were done  Ambulation/Gait Ambulation/Gait assistance: Min guard Gait Distance  (Feet): 200 [phone]) Assistive device: Rolling walker (2 wheeled);1 person hand held assist Gait Pattern/deviations: Step-through pattern;Decreased stride length;Wide base of support Gait velocity: reduced Gait velocity interpretation: (desaturating with mobility) General Gait Details: Requiring min guard for stability, irregular left foot placement. One standing rest break required. DOE 2/4   Marine scientist Rankin (Stroke Patients Only)       Balance Overall balance assessment: Needs assistance Sitting-balance support: Feet supported Sitting balance-Leahy Scale: Good     Standing balance support: Bilateral upper extremity supported Standing balance-Leahy Scale: Fair                              Cognition Arousal/Alertness: Awake/alert Behavior During Therapy: WFL for tasks assessed/performed Overall Cognitive Status: Within Functional Limits for tasks assessed                                        Exercises      General Comments General comments (skin integrity, edema, etc.): pt could don his mask while releasing his hands from the walker      Pertinent Vitals/Pain Pain Assessment: No/denies pain Faces Pain Scale: No hurt Pain Intervention(s): Monitored during session    Home Living  Prior Function            PT Goals (current goals can now be found in the care plan section) Acute Rehab PT Goals Patient Stated Goal: go home Progress towards PT goals: Progressing toward goals    Frequency    Min 3X/week      PT Plan Current plan remains appropriate    Co-evaluation PT/OT/SLP Co-Evaluation/Treatment: Yes Reason for Co-Treatment: For patient/therapist safety;To address functional/ADL transfers PT goals addressed during session: Mobility/safety with mobility;Balance OT goals addressed during session: ADL's and self-care;Proper use of Adaptive  equipment and DME      AM-PAC PT "6 Clicks" Mobility   Outcome Measure  Help needed turning from your back to your side while in a flat bed without using bedrails?: None Help needed moving from lying on your back to sitting on the side of a flat bed without using bedrails?: A Little Help needed moving to and from a bed to a chair (including a wheelchair)?: A Little Help needed standing up from a chair using your arms (e.g., wheelchair or bedside chair)?: A Little Help needed to walk in hospital room?: A Little Help needed climbing 3-5 steps with a railing? : A Little 6 Click Score: 19    End of Session Equipment Utilized During Treatment: Oxygen;Gait belt Activity Tolerance: Patient tolerated treatment well;Treatment limited secondary to medical complications (Comment) Patient left: in chair;with call bell/phone within reach;with chair alarm set Nurse Communication: Mobility status PT Visit Diagnosis: Unsteadiness on feet (R26.81);Muscle weakness (generalized) (M62.81);Difficulty in walking, not elsewhere classified (R26.2)     Time: 7654-6503 PT Time Calculation (min) (ACUTE ONLY): 39 min  Charges:  $Gait Training: 8-22 mins $Therapeutic Activity: 8-22 mins                    Ramond Dial 01/08/2020, 4:22 PM  Mee Hives, PT MS Acute Rehab Dept. Number: [place] and [place]

## 2020-01-09 LAB — CBC
HCT: 27.2 % — ABNORMAL LOW (ref 39.0–52.0)
Hemoglobin: 8.6 g/dL — ABNORMAL LOW (ref 13.0–17.0)
MCH: 29.4 pg (ref 26.0–34.0)
MCHC: 31.6 g/dL (ref 30.0–36.0)
MCV: 92.8 fL (ref 80.0–100.0)
Platelets: 239 10*3/uL (ref 150–400)
RBC: 2.93 MIL/uL — ABNORMAL LOW (ref 4.22–5.81)
RDW: 17.9 % — ABNORMAL HIGH (ref 11.5–15.5)
WBC: 12.7 10*3/uL — ABNORMAL HIGH (ref 4.0–10.5)
nRBC: 0 % (ref 0.0–0.2)

## 2020-01-09 LAB — GLUCOSE, CAPILLARY
Glucose-Capillary: 99 mg/dL (ref 70–99)
Glucose-Capillary: 231 mg/dL — ABNORMAL HIGH (ref 70–99)
Glucose-Capillary: 233 mg/dL — ABNORMAL HIGH (ref 70–99)
Glucose-Capillary: 196 mg/dL — ABNORMAL HIGH (ref 70–99)

## 2020-01-09 LAB — RENAL FUNCTION PANEL
Albumin: 2.3 g/dL — ABNORMAL LOW (ref 3.5–5.0)
Anion gap: 14 (ref 5–15)
BUN: 73 mg/dL — ABNORMAL HIGH (ref 8–23)
CO2: 23 mmol/L (ref 22–32)
Calcium: 8.5 mg/dL — ABNORMAL LOW (ref 8.9–10.3)
Chloride: 105 mmol/L (ref 98–111)
Creatinine, Ser: 7.28 mg/dL — ABNORMAL HIGH (ref 0.61–1.24)
GFR calc Af Amer: 8 mL/min — ABNORMAL LOW (ref >60)
GFR calc non Af Amer: 7 mL/min — ABNORMAL LOW (ref >60)
Glucose, Bld: 190 mg/dL — ABNORMAL HIGH (ref 70–99)
Phosphorus: 5.3 mg/dL — ABNORMAL HIGH (ref 2.5–4.6)
Potassium: 3.8 mmol/L (ref 3.5–5.1)
Sodium: 142 mmol/L (ref 135–145)

## 2020-01-09 LAB — PROTIME-INR
INR: 2.7 — ABNORMAL HIGH (ref 0.8–1.2)
Prothrombin Time: 28.4 seconds — ABNORMAL HIGH (ref 11.4–15.2)

## 2020-01-09 MED ORDER — WARFARIN SODIUM 2 MG PO TABS: 2.0000 mg | ORAL_TABLET | Freq: Once | ORAL | Status: DC

## 2020-01-09 MED ORDER — WARFARIN SODIUM 2 MG PO TABS
2.0000 mg | ORAL_TABLET | Freq: Every day | ORAL | Status: DC
  Administered 2020-01-09 – 2020-01-10 (×2): 2 mg via ORAL
  Filled 2020-01-09 (×3): qty 1

## 2020-01-09 NOTE — Plan of Care (Signed)
  Problem: Activity: Goal: Risk for activity intolerance will decrease Outcome: Progressing   

## 2020-01-09 NOTE — Progress Notes (Signed)
[facility] Progress Note   Assessment/ Plan:   AoCKD 3 - in setting of vol depletion/ vomiting/ diarrhea and ACEi. B/l creat is 1.5.urine shows 100 of protein, few RBC -  HD started on 2/28- doing PRN,  Done  3/8 and then 3/13 -(VIR) placed RIJ TC;   - - BUN and crt increasing in between HD treatments but only needed 2 times last week- Mon ans Sat. UOP seems to be increasing -  since crt pretty good 5 weeks ago need to watch a little longer- daily labs- cont to watch but also need to see if he could get AKI HD as OP if does not show signs of recovery over the next few days. No HD today 3/16, hopefully plateauing  AKI seat in process  2. DM on insulin 3. Vol depletion -treated-  BP good on  norvasc 5 and coreg 6.25 BID 4. Lactic / metabolic acidosis - severethen resolved- worsening as renal function declines- better after HD.  5. COPD - on homeO2. 6. Anemia-  iron stores low, gave feraheme 3/9- second dose 3/12- started ESA 7. Bones-  Phos OK-  PTH 131 - no meds  Subjective:    Cr up to 7.3.  Not uremic, good UOP.     Objective:   BP (!) 153/71 (BP Location: Right Arm)   Pulse 68   Temp 97.6 F (36.4 C) (Oral)   Resp 18   Ht 5\' 11"  (1.803 m)   Wt 76.2 kg   SpO2 92%   BMI 23.43 kg/m   Physical Exam: Gen: NAD CVS: RRR Resp: clear bilaterally, on chronic dose of O2 Abd: soft Ext: no LE edema ACCESS: R IJ [facility]  Labs: BMET Recent Labs  Lab 01/03/20 0431 01/04/20 0438 01/05/20 0715 01/06/20 0517 01/07/20 0410 01/08/20 1012 01/09/20 0334  NA 136 136 137 138 135 141 142  K 3.7 4.1 4.2 3.9 4.0 3.3* 3.8  CL 100 102 102 102 100 104 105  CO2 24 20* 20* 20* 24 24 23   GLUCOSE 99 121* 77 106* 176* 179* 190*  BUN 72* 87* 99* 103* 50* 64* 73*  CREATININE 6.75* 8.39* 9.66* 10.10* 6.09* 7.10* 7.28*  CALCIUM 8.3* 8.5* 8.7* 8.7* 8.2* 8.3* 8.5*  PHOS 4.1 5.0* 5.7* 6.1* 4.7* 5.2* 5.3*   CBC Recent Labs  Lab 01/05/20 0715 01/06/20 0517 01/07/20 0410  01/09/20 0334  WBC 14.1* 12.1* 10.4 12.7*  HGB 8.9* 8.3* 8.2* 8.6*  HCT 26.3* 24.9* 24.6* 27.2*  MCV 87.7 87.7 88.2 92.8  PLT 237 230 213 239      Medications:    . amLODipine  5 mg Oral Daily  . carvedilol  6.25 mg Oral BID WC  . Chlorhexidine Gluconate Cloth  6 each Topical Daily  . Chlorhexidine Gluconate Cloth  6 each Topical Q0600  . coumadin book   Does not apply Once  . darbepoetin (ARANESP) injection - NON-DIALYSIS  60 mcg Subcutaneous Q Mon-1800  . feeding supplement (NEPRO CARB STEADY)  237 mL Oral TID BM  . guaiFENesin  600 mg Oral BID  . insulin aspart  0-5 Units Subcutaneous QHS  . insulin aspart  0-9 Units Subcutaneous TID WC  . insulin aspart  2 Units Subcutaneous TID WC  . insulin glargine  4 Units Subcutaneous Daily  . levETIRAcetam  500 mg Oral BID  . multivitamin  1 tablet Oral QHS  . predniSONE  20 mg Oral Q breakfast  . senna-docusate  1 tablet Oral  BID  . umeclidinium-vilanterol  1 puff Inhalation Daily  . warfarin  2 mg Oral q1800  . Warfarin - Pharmacist Dosing Inpatient   Does not apply q1800     Madelon Lips, MD 01/09/2020, 12:25 PM

## 2020-01-09 NOTE — Progress Notes (Signed)
ANTICOAGULATION CONSULT NOTE - Follow-Up  Pharmacy Consult for warfarin Indication: atrial fibrillation  No Known Allergies  Patient Measurements: Height: 5\' 11"  (180.3 cm) Weight: 167 lb 15.9 oz (76.2 kg) IBW/kg (Calculated) : 75.3  Heparin Dosing Weight: 70.6 kg  Vital Signs: Temp: 97.6 F (36.4 C) (03/16 0439) Temp Source: Oral (03/16 0439) BP: 153/71 (03/16 0439) Pulse Rate: 68 (03/16 0918)  Labs: Recent Labs    01/07/20 0410 01/08/20 1012 01/09/20 0334  HGB 8.2*  --  8.6*  HCT 24.6*  --  27.2*  PLT 213  --  239  LABPROT 26.0* 27.5* 28.4*  INR 2.4* 2.6* 2.7*  CREATININE 6.09* 7.10* 7.28*    Estimated Creatinine Clearance: 10.5 mL/min (A) (by C-G formula based on SCr of 7.28 mg/dL (H)).  Assessment: 69 yr old M on apixaban PTA for afib (CHADS2VASc = 3), last apixaban dose on 2/27, due to concern for coffee ground emesis, which has resolved. With worsening renal fxn, pharmacy was consulted to dose warfarin for Afib.  INR 2.7 therapeutic, slowly uptrending on 2.5mg . H/H, plt stable. Eating 0-100% of meals. Will decrease INR frequency to MWF. Anticipate can maintain on 2mg  warfarin daily.   Goal of Therapy:  INR 2-3 Monitor platelets by anticoagulation protocol: Yes   Plan:  Ordered scheduled warfarin 2mg  daily, will adjust as needed Monitor QMWF INR, at least weekly CBC Monitor for signs/symptoms of bleeding    Benetta Spar, PharmD, BCPS, BCCP Clinical Pharmacist  Please check AMION for all [place] phone numbers After 10:00 PM, call [place]

## 2020-01-09 NOTE — Progress Notes (Signed)
Inpatient Diabetes Program Recommendations  AACE/ADA: New Consensus Statement on Inpatient Glycemic Control  Target Ranges:  Prepandial:   less than 140 mg/dL      Peak postprandial:   less than 180 mg/dL (1-2 hours)      Critically ill patients:  140 - 180 mg/dL  Results for Kyle [name], Kyle [name] (MRN [id number]) as of 01/09/2020 11:54  Ref. Range 01/09/2020 07:06 01/09/2020 11:27  Glucose-Capillary Latest Ref Range: 70 - 99 mg/dL 99 231 (H)   Results for Kyle [name], Kyle [name] (MRN [id number]) as of 01/09/2020 11:54  Ref. Range 01/08/2020 06:47 01/08/2020 11:23 01/08/2020 16:39 01/08/2020 21:15  Glucose-Capillary Latest Ref Range: 70 - 99 mg/dL 118 (H) 154 (H) 310 (H) 239 (H)   Review of Glycemic Control  Diabetes history: DM2 Outpatient Diabetes medications: Lantus 15 units QHS, Metformin 500 mg BID Current orders for Inpatient glycemic control: Lantus 4 units daily, Novolog 2 units TID with meals, Novolog 0-9 units TID with meals, Novolog 0-5 units QHS; Prednisone 20 mg QAM  Inpatient Diabetes Program Recommendations:    Insulin-Meal Coverage: If steroids continued and if post prandial is consistently greater than 180 mg/dl, please consider increasing meal coverage to Novolog 4 units TID with meals.  Thanks, Barnie Alderman, RN, MSN, CDE Diabetes Coordinator Inpatient Diabetes Program [phone] (Team Pager from 8am to 5pm)

## 2020-01-09 NOTE — Plan of Care (Signed)
  Problem: Clinical Measurements: Goal: Ability to maintain clinical measurements within normal limits will improve Outcome: Progressing Goal: Diagnostic test results will improve Outcome: Progressing   Problem: Clinical Measurements: Goal: Will remain free from infection Outcome: Adequate for Discharge

## 2020-01-09 NOTE — Progress Notes (Signed)
PROGRESS NOTE    Kyle [name]  KJZ:[id number] DOB: May 22, 1951 DOA: 12/24/2019 PCP: Alonna Buckler, MD   Brief Narrative: As pr HPI:  47 yom history of heavy smoking, COPD, peripheral vascular disease presenting with nausea, intermittent vomiting, abd cramping/pain and diarrhea for 1 week found to have acute renal failure, hyperkalemia and severe lactic acidosis of unclear etiology. Patient was intubated on 12/25/2019 and extubated on 12/26/2019.  Patient back on baseline O2 and following commands appropriately.  He is deconditioned weak and frail.  Subjective:  Patient reports he is doing well.  He is waiting to go home. Left elbow pain has improved. No new complaints.  Had urine output about 700 mL.  Assessment & Plan:  Acute renal failure on CKD stage IIIa/metabolic acidosis/hyperphosphatemia: in the setting of volume depletion/vomiting diarrhea, ACE inhibitor.  Baseline creatinine 1.5.  Seen by nephrology placed on HD 2/28 last HD on 3/8, 3/13-access-right IJ TDC.Noted nephrology plan to watch closely-AKI HD is in process, hoping for renal recovery, nephrology eval today to see if he needs dialysis. Recent Labs  Lab 01/05/20 0715 01/06/20 0517 01/07/20 0410 01/08/20 1012 01/09/20 0334  BUN 99* 103* 50* 64* 73*  CREATININE 9.66* 10.10* 6.09* 7.10* 9.48*   Acute metabolic encephalopathy: Uremic encephalopathy,seizure in the setting of significant uremia.  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  Had ammonia level high at 56.  Initially needed intubation subsequently extubated 3/2, CT head and MRI head no acute intracranial finding.  EEG was negative.  Seen by neurology, placed on Keppra twice daily.  Currently he is at baseline mental status and appropriate  Chronic hypoxic respiratory failure/COPD on home oxygen 4 to 5 L nasal cannula.  Continue the same.  Continue pulmonary support, bronchodilators and ambulation.  Intermittently patient is needing more oxygen.  Watch closely for  fluid overload, continue respiratory support.  He is on 5 L nasal cannula saturating 92-95%.  Left elbow pain/swelling-?gout w AKI/CKD related-swelling, significantly improving on prednisone 20 mg continue x5 days. X-ray unremarkable uric acid normal.Elevate the left arm.    Hypertension: Blood pressure fairly stable continue Norvasc and Coreg.  PAF/dilated cardiomyopathy/aortic stenosis/peripheral vascular disease: Patient has been lost to follow-up with cardiology in the past, with medication noncompliance.  Patient is reportedly was compliant with his medication at home recently.  On apixaban prior to admission now transitioned to Coumadin the setting of his acute kidney injury.Continue to monitor INR and pharmacy dosing.  Continue Coreg. INR therapeutic 2.7.  Hyperkalemia resolved.  Lactic acidosis/metabolic acidosis severe but resolved currently.  Vomiting diarrhea and abdominal pain/ volume depletion treated, currently stable.  CT abdomen and pelvis with renal stone protocol no source of patient's abdominal pain.  Overall symptoms have resolved.   Could be possibly from ischemic colitis per pccm. Coffee-ground emesis-resolved  Anemia with low iron storage:gave Feraheme 3/9 and also started on ESA.  Appreciate nephrology input. Recent Labs  Lab 01/04/20 0438 01/05/20 0715 01/06/20 0517 01/07/20 0410 01/09/20 0334  HGB 8.7* 8.9* 8.3* 8.2* 8.6*  HCT 26.2* 26.3* 24.9* 24.6* 27.2*   Right lower lung nodule 5 mm repeat CT scan in 12 months will be needed.  Follow-up with pulmonary outpatient.  Insulin-dependent diabetes mellitus A1c 8.0.Blood sugar was low in 50s/hypoglycemic previously likely due to  worsening renal failure and decreased insulin clearance.  Sugar stable after cutting down Lantus further to 4 units daily, 2 units premeal insulin and sliding scale insulin and monitor. Recent Labs  Lab 01/08/20 1123 01/08/20  1639 01/08/20 2115 01/09/20 0706 01/09/20 1127  GLUCAP  154* 310* 239* 99 231*   Nutrition: Nutrition Problem: Inadequate oral intake Etiology: inability to eat Signs/Symptoms: NPO status Interventions: MVI, Nepro shake Body mass index is 23.43 kg/m.   DVT prophylaxis:warfarin Code Status:FULL Family Communication: plan of care discussed with patient at bedside. Disposition Plan: Patient is from:home Anticipated Disposition: to HHPT/Supervision for mobility Barriers to discharge or conditions that needs to be met prior to discharge: Patient remains in the hospital with acute renal failure, monitor renal functions for recovery and diaysis need.BUN creatinine worsens whle not getting dialysis, nephrology actively monitoring for need for outpatient AKI HD-which is in process. Home once cleared by nephrology.  Consultants:   Interventional radiology: Dr. Earleen Newport  Neurology: Dr. Cheral Marker 12/24/2019  Nephrology: Dr. Jonnie Finner 12/24/2019  [facility] hospitalist: Dr. Lorin Mercy 12/24/2019  Procedures:   CT renal stone protocol 12/24/2019  Chest x-ray 12/24/2019, 12/25/2019  MRI brain MRA head 12/24/2019  Tunneled central venous HD catheter placement per IR 12/28/2019  Renal ultrasound 12/24/2019  EEG 12/24/2019--- suggestive of moderate to severe diffuse encephalopathy, nonspecific etiology.  Right IJ HD cath 12/24/2019  ETT 12/25/2019>>>> 12/26/2019  Microbiology:see note  Medications: Scheduled Meds: . amLODipine  5 mg Oral Daily  . carvedilol  6.25 mg Oral BID WC  . Chlorhexidine Gluconate Cloth  6 each Topical Daily  . Chlorhexidine Gluconate Cloth  6 each Topical Q0600  . coumadin book   Does not apply Once  . darbepoetin (ARANESP) injection - NON-DIALYSIS  60 mcg Subcutaneous Q Mon-1800  . feeding supplement (NEPRO CARB STEADY)  237 mL Oral TID BM  . guaiFENesin  600 mg Oral BID  . insulin aspart  0-5 Units Subcutaneous QHS  . insulin aspart  0-9 Units Subcutaneous TID WC  . insulin aspart  2 Units Subcutaneous TID WC  . insulin glargine  4  Units Subcutaneous Daily  . levETIRAcetam  500 mg Oral BID  . multivitamin  1 tablet Oral QHS  . predniSONE  20 mg Oral Q breakfast  . senna-docusate  1 tablet Oral BID  . umeclidinium-vilanterol  1 puff Inhalation Daily  . warfarin  2 mg Oral q1800  . Warfarin - Pharmacist Dosing Inpatient   Does not apply q1800   Continuous Infusions: . sodium chloride 250 mL (12/29/19 0532)    Antimicrobials: Anti-infectives (From admission, onward)   Start     Dose/Rate Route Frequency Ordered Stop   12/28/19 1400  ceFAZolin (ANCEF) IVPB 2g/100 mL premix     2 g 200 mL/hr over 30 Minutes Intravenous  Once 12/28/19 1347 12/28/19 1533       Objective: Vitals: Today's Vitals   01/08/20 2142 01/09/20 0439 01/09/20 0841 01/09/20 0918  BP:  (!) 153/71    Pulse:  67  68  Resp:  17  18  Temp:  97.6 F (36.4 C)    TempSrc:  Oral    SpO2:  93%  92%  Weight:      Height:      PainSc: 0-No pain  0-No pain     Intake/Output Summary (Last 24 hours) at 01/09/2020 1143 Last data filed at 01/09/2020 0956 Gross per 24 hour  Intake 997 ml  Output 700 ml  Net 297 ml   Filed Weights   01/06/20 1700 01/06/20 2106 01/07/20 1954  Weight: 78.3 kg 76.2 kg 76.2 kg   Weight change:    Intake/Output from previous day: 03/15 0701 - 03/16 0700 In: 657 [P.O.:657]  Out: 700 [Urine:700] Intake/Output this shift: Total I/O In: 340 [P.O.:340] Out: 0   Examination:  General exam: AAO x3, NAD, weak appearing. HEENT:Oral mucosa moist, Ear/Nose WNL grossly, dentition normal. Respiratory system: bilaterally mild basal crackles present but no respiratory distress or use of accessory muscles.   Cardiovascular system: S1 & S2 +, No JVD. Gastrointestinal system: Abdomen soft, NT,ND, BS+ Nervous System:Alert, awake, moving extremities and grossly nonfocal. Extremities: No edema, distal peripheral pulses palpable.  Mild elbow swelling and tenderness present. Skin: No rashes,no icterus. MSK: Normal muscle  bulk,tone, power Rt IJ HD cath present.  Data Reviewed: I have personally reviewed following labs and imaging studies CBC: Recent Labs  Lab 01/04/20 0438 01/05/20 0715 01/06/20 0517 01/07/20 0410 01/09/20 0334  WBC 13.2* 14.1* 12.1* 10.4 12.7*  HGB 8.7* 8.9* 8.3* 8.2* 8.6*  HCT 26.2* 26.3* 24.9* 24.6* 27.2*  MCV 87.0 87.7 87.7 88.2 92.8  PLT 204 237 230 213 998   Basic Metabolic Panel: Recent Labs  Lab 01/05/20 0715 01/06/20 0517 01/07/20 0410 01/08/20 1012 01/09/20 0334  NA 137 138 135 141 142  K 4.2 3.9 4.0 3.3* 3.8  CL 102 102 100 104 105  CO2 20* 20* '24 24 23  '$ GLUCOSE 77 106* 176* 179* 190*  BUN 99* 103* 50* 64* 73*  CREATININE 9.66* 10.10* 6.09* 7.10* 7.28*  CALCIUM 8.7* 8.7* 8.2* 8.3* 8.5*  PHOS 5.7* 6.1* 4.7* 5.2* 5.3*   GFR: Estimated Creatinine Clearance: 10.5 mL/min (A) (by C-G formula based on SCr of 7.28 mg/dL (H)). Liver Function Tests: Recent Labs  Lab 01/05/20 0715 01/06/20 0517 01/07/20 0410 01/08/20 1012 01/09/20 0334  ALBUMIN 2.2* 2.2* 2.0* 2.2* 2.3*   No results for input(s): LIPASE, AMYLASE in the last 168 hours. No results for input(s): AMMONIA in the last 168 hours. Coagulation Profile: Recent Labs  Lab 01/05/20 0715 01/06/20 0517 01/07/20 0410 01/08/20 1012 01/09/20 0334  INR 2.3* 2.5* 2.4* 2.6* 2.7*   Cardiac Enzymes: No results for input(s): CKTOTAL, CKMB, CKMBINDEX, TROPONINI in the last 168 hours. BNP (last 3 results) No results for input(s): PROBNP in the last 8760 hours. HbA1C: No results for input(s): HGBA1C in the last 72 hours. CBG: Recent Labs  Lab 01/08/20 1123 01/08/20 1639 01/08/20 2115 01/09/20 0706 01/09/20 1127  GLUCAP 154* 310* 239* 99 231*   Lipid Profile: No results for input(s): CHOL, HDL, LDLCALC, TRIG, CHOLHDL, LDLDIRECT in the last 72 hours. Thyroid Function Tests: No results for input(s): TSH, T4TOTAL, FREET4, T3FREE, THYROIDAB in the last 72 hours. Anemia Panel: No results for input(s):  VITAMINB12, FOLATE, FERRITIN, TIBC, IRON, RETICCTPCT in the last 72 hours. Sepsis Labs: No results for input(s): PROCALCITON, LATICACIDVEN in the last 168 hours.  No results found for this or any previous visit (from the past 240 hour(s)).    Radiology Studies: No results found.   LOS: 16 days   Time spent: More than 50% of that time was spent in counseling and/or coordination of care.  Antonieta Pert, MD [facility]  01/09/2020, 11:43 AM

## 2020-01-09 NOTE — Progress Notes (Signed)
Patient has been accepted at [facility] on a TTS schedule with a seat time of 12:50pm if it is determined that he will need OP HD treatment. Per Dr. Bennye Alm, she is still currently watching for renal recovery. Navigator following along and will not speak with patient unless OP HD seat is needed. Navigator will cancel seat if patient discharges without OP HD need.  Alphonzo Cruise, [place] Renal Navigator [phone]

## 2020-01-09 NOTE — Progress Notes (Signed)
Nutrition Follow-up  DOCUMENTATION CODES:   Not applicable  INTERVENTION:   Continue Nepro Shake po TID, each supplement provides 425 kcal and 19 grams protein  Continue Rena-Vit daily at bedtime  NUTRITION DIAGNOSIS:   Inadequate oral intake related to inability to eat as evidenced by NPO status.  Being addressed as diet advanced, supplements  GOAL:   Patient will meet greater than or equal to 90% of their needs  Progressing  MONITOR:   PO intake, Supplement acceptance, Labs, Weight trends  REASON FOR ASSESSMENT:   Consult Enteral/tube feeding initiation and management  ASSESSMENT:   69 yo male admitted with abdominal pain, AKI, hyperkalemia. PMH includes HTN, cardiomyopathy, heavy smoking, COPD on home oxygen, severe PVD, insulin dependent diabetes.  3/13 R IJ TDC placed, last iHD  Pt only had iHD 2x last week (Monday and Saturday); Nephrology continuing to watch for renal recovery. No HD today   Recorded po intake 50-100% of meals. Drinking some Nepro, likes Butter Pecan  Current weight: 76.2 kg Weight post iHD on 3/13: 76.2 kg (pre-HD weight of (78.5 kg) I/O: UOP 700 mL in 24 hours  Labs: iPTH 131 (acceptable for iHD/no meds), phosphorus 5.3 (acceptable for HD), CBGs 118-310, poatssium 3.8 (wdl) Meds: Aransep, ss novolog, lantus, Rena-vit, prednisone   Diet Order:   Diet Order            Diet renal with fluid restriction Fluid restriction: 1200 mL Fluid; Room service appropriate? Yes; Fluid consistency: Thin  Diet effective now              EDUCATION NEEDS:   Education needs have been addressed  Skin:  Skin Assessment: Reviewed RN Assessment  Last BM:  3/8  Height:   Ht Readings from Last 1 Encounters:  12/24/19 5\' 11"  (1.803 m)    Weight:   Wt Readings from Last 1 Encounters:  01/07/20 76.2 kg    Ideal Body Weight:  78.2 kg  BMI:  Body mass index is 23.43 kg/m.  Estimated Nutritional Needs:   Kcal:  2000-2200  Protein:   90-110 gm  Fluid:  1 L + UOP   Kerman Passey MS, RDN, LDN, CNSC RD Pager Number and Weekend/On-Call After Hours Pager Located in [place]

## 2020-01-10 LAB — RENAL FUNCTION PANEL
Albumin: 2.2 g/dL — ABNORMAL LOW (ref 3.5–5.0)
Anion gap: 14 (ref 5–15)
BUN: 73 mg/dL — ABNORMAL HIGH (ref 8–23)
CO2: 24 mmol/L (ref 22–32)
Calcium: 8.5 mg/dL — ABNORMAL LOW (ref 8.9–10.3)
Chloride: 107 mmol/L (ref 98–111)
Creatinine, Ser: 6.77 mg/dL — ABNORMAL HIGH (ref 0.61–1.24)
GFR calc Af Amer: 9 mL/min — ABNORMAL LOW (ref >60)
GFR calc non Af Amer: 8 mL/min — ABNORMAL LOW (ref >60)
Glucose, Bld: 146 mg/dL — ABNORMAL HIGH (ref 70–99)
Phosphorus: 5 mg/dL — ABNORMAL HIGH (ref 2.5–4.6)
Potassium: 3.5 mmol/L (ref 3.5–5.1)
Sodium: 145 mmol/L (ref 135–145)

## 2020-01-10 LAB — CBC
HCT: 27.3 % — ABNORMAL LOW (ref 39.0–52.0)
Hemoglobin: 8.6 g/dL — ABNORMAL LOW (ref 13.0–17.0)
MCH: 29.2 pg (ref 26.0–34.0)
MCHC: 31.5 g/dL (ref 30.0–36.0)
MCV: 92.5 fL (ref 80.0–100.0)
Platelets: 235 10*3/uL (ref 150–400)
RBC: 2.95 MIL/uL — ABNORMAL LOW (ref 4.22–5.81)
RDW: 18.3 % — ABNORMAL HIGH (ref 11.5–15.5)
WBC: 12.6 10*3/uL — ABNORMAL HIGH (ref 4.0–10.5)
nRBC: 0.2 % (ref 0.0–0.2)

## 2020-01-10 LAB — GLUCOSE, CAPILLARY
Glucose-Capillary: 131 mg/dL — ABNORMAL HIGH (ref 70–99)
Glucose-Capillary: 144 mg/dL — ABNORMAL HIGH (ref 70–99)
Glucose-Capillary: 225 mg/dL — ABNORMAL HIGH (ref 70–99)
Glucose-Capillary: 313 mg/dL — ABNORMAL HIGH (ref 70–99)

## 2020-01-10 LAB — PROTIME-INR
INR: 2.7 — ABNORMAL HIGH (ref 0.8–1.2)
Prothrombin Time: 28.9 seconds — ABNORMAL HIGH (ref 11.4–15.2)

## 2020-01-10 MED ORDER — INSULIN GLARGINE 100 UNIT/ML ~~LOC~~ SOLN
6.0000 [IU] | Freq: Every day | SUBCUTANEOUS | Status: DC
  Administered 2020-01-11: 6 [IU] via SUBCUTANEOUS
  Filled 2020-01-10: qty 0.06

## 2020-01-10 NOTE — TOC Initial Note (Signed)
Transition of Care [facility]) - Initial/Assessment Note    Patient Details  Name: Kyle [name] MRN: [id number] Date of Birth: [birth year]  Transition [facility]) CM/SW Contact:    Bartholomew Crews, RN Phone Number: [phone] 01/10/2020, 3:39 PM  Clinical Narrative:                 Spoke with patient at the bedside to discuss transition needs for home. Patient declined [place] and DME at this time, but appreciated offer. Patient put his spouse on the phone, and spouse agreed that [facility] and DME not needed. Verified address, PCP, and pharmacy correct in [place]. Patient would like to use [facility] pharmacy for DC medications. TOC team following for transition needs.   Expected Discharge Plan: Home/Self Care Barriers to Discharge: Continued Medical Work up   Patient Goals and CMS Choice Patient states their goals for this hospitalization and ongoing recovery are:: return home with wife CMS Medicare.gov Compare Post Acute Care list provided to:: Patient Choice offered to / list presented to : Patient, Spouse  Expected Discharge Plan and Services Expected Discharge Plan: Home/Self Care In-house Referral: NA Discharge Planning Services: CM Consult Post Acute Care Choice: Durable Medical Equipment, Home Health Living arrangements for the past 2 months: Apartment                 DME Arranged: N/A(refused RW) DME Agency: NA       HH Arranged: Refused [place] Agency: NA        Prior Living Arrangements/Services Living arrangements for the past 2 months: Apartment Lives with:: Self, Spouse Patient language and need for interpreter reviewed:: Yes Do you feel safe going back to the place where you live?: Yes      Need for Family Participation in Patient Care: Yes (Comment) Care giver support system in place?: Yes (comment)   Criminal Activity/Legal Involvement Pertinent to Current Situation/Hospitalization: No - Comment as needed  Activities of Daily Living Home Assistive Devices/Equipment: CBG Meter,  Dentures (specify type), Oxygen ADL Screening (condition at time of admission) Patient's cognitive ability adequate to safely complete daily activities?: Yes Is the patient deaf or have difficulty hearing?: No Does the patient have difficulty seeing, even when wearing glasses/contacts?: No Does the patient have difficulty concentrating, remembering, or making decisions?: No Patient able to express need for assistance with ADLs?: Yes Does the patient have difficulty dressing or bathing?: No Independently performs ADLs?: Yes (appropriate for developmental age) Does the patient have difficulty walking or climbing stairs?: No Weakness of Legs: None Weakness of Arms/Hands: None  Permission Sought/Granted Permission sought to share information with : Family Supports Permission granted to share information with : Yes, Verbal Permission Granted  Share Information with NAME: Lucita Ferrara     Permission granted to share info w Relationship: spouse     Emotional Assessment Appearance:: Appears stated age Attitude/Demeanor/Rapport: Engaged Affect (typically observed): Accepting Orientation: : Oriented to Self, Oriented to  Time, Oriented to Place, Oriented to Situation Alcohol / Substance Use: Not Applicable Psych Involvement: No (comment)  Admission diagnosis:  Hyperkalemia [E87.5] Lactic acidosis [E87.2] Lactic acid acidosis [E87.2] Acute renal failure, unspecified acute renal failure type [facility]) [N17.9] Patient Active Problem List   Diagnosis Date Noted  . Acute metabolic encephalopathy   . Lactic acidosis 12/24/2019  . Uremia 12/24/2019  . High anion gap metabolic acidosis 73/71/0626  . Atrial fibrillation, chronic ([place]) 12/24/2019  . Diabetes mellitus type 2, insulin dependent ([place]) 12/24/2019  . Hyperkalemia, diminished renal excretion  12/24/2019  . Healthcare maintenance 07/14/2019  . Medication management 07/14/2019  . PAD (peripheral artery disease) ([place]) 04/22/2019  .  Atherosclerosis of artery of extremity with rest pain ([place]) 04/21/2019  . COPD with chronic bronchitis and emphysema ([place]) 12/16/2018  . Chronic respiratory failure with hypoxia ([place]) 12/16/2018  . Acute renal failure ([place])   . Protein-calorie malnutrition, severe ([place]) 12/18/2014  . Aortoiliac occlusive disease ([place]) 12/16/2014  . PVD (peripheral vascular disease) ([place]) 10/24/2014  . Aortic stenosis 09/12/2014  . 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 Pulmonary hypertension ([place]) 09/12/2014  . Dyslipidemia 09/12/2014  . Atherosclerosis of native arteries of extremity with intermittent claudication ([place]) 05/11/2012  . Aortic insufficiency   . Hypertension   . Peripheral arterial disease ([place])   . Dilated cardiomyopathy ([place])   . Chronic systolic CHF (congestive heart failure) ([place])    PCP:  Alonna Buckler, MD Pharmacy:   [place], [place] - [address] [name] [address] [place] [address] Phone: [phone] Fax: [facility] - [place], [place] [address] Phone: [phone] Fax: [phone]  [facility] [place], [place] [address] [place] [address] Phone: [phone] Fax: [phone]     Social Determinants of Health ([place]) Interventions    Readmission Risk Interventions Readmission Risk Prevention Plan 04/26/2019  Transportation Screening Complete  PCP or Specialist Appt within 5-7 Days Complete  Home Care Screening Complete  Medication Review (RN CM) Complete  Some recent data might be hidden

## 2020-01-10 NOTE — Progress Notes (Signed)
[facility] Progress Note   Assessment/ Plan:   AoCKD 3 - in setting of vol depletion/ vomiting/ diarrhea and ACEi. B/l creat is 1.5.urine shows 100 of protein, few RBC -  HD started on 2/28- doing PRN,  Done  3/8 and then 3/13 -(VIR) placed RIJ TC;   - - BUN and crt increasing in between HD treatments but only needed 2 times last week- Mon ans Sat. UOP seems to be increasing -  since crt pretty good 5 weeks ago need to watch a little longer- daily labs- cont to watch but also need to see if he could get AKI HD as OP if does not show signs of recovery over the next few days. Looks like pt is recovering-- will check labs tomorrow and if good will d/c TDC and send home with OP followup.  2. DM on insulin 3. Vol depletion -treated-  BP good on  norvasc 5 and coreg 6.25 BID 4. Lactic / metabolic acidosis - severethen resolved- worsening as renal function declines- better after HD.  5. COPD - on homeO2. 6. Anemia-  iron stores low, gave feraheme 3/9- second dose 3/12- started ESA 7. Bones-  Phos OK-  PTH 131 - no meds  Subjective:    Cr downtrending!  Has good UOP.      Objective:   BP 140/60 (BP Location: Left Arm)   Pulse 64   Temp 97.6 F (36.4 C) (Oral)   Resp 20   Ht 5\' 11"  (1.803 m)   Wt 76.5 kg   SpO2 90%   BMI 23.52 kg/m   Physical Exam: Gen: NAD CVS: RRR Resp: clear bilaterally, on chronic dose of O2 Abd: soft Ext: no LE edema ACCESS: R IJ [facility]  Labs: BMET Recent Labs  Lab 01/04/20 0438 01/05/20 0715 01/06/20 0517 01/07/20 0410 01/08/20 1012 01/09/20 0334 01/10/20 0620  NA 136 137 138 135 141 142 145  K 4.1 4.2 3.9 4.0 3.3* 3.8 3.5  CL 102 102 102 100 104 105 107  CO2 20* 20* 20* 24 24 23 24   GLUCOSE 121* 77 106* 176* 179* 190* 146*  BUN 87* 99* 103* 50* 64* 73* 73*  CREATININE 8.39* 9.66* 10.10* 6.09* 7.10* 7.28* 6.77*  CALCIUM 8.5* 8.7* 8.7* 8.2* 8.3* 8.5* 8.5*  PHOS 5.0* 5.7* 6.1* 4.7* 5.2* 5.3* 5.0*   CBC Recent Labs  Lab  01/06/20 0517 01/07/20 0410 01/09/20 0334 01/10/20 0620  WBC 12.1* 10.4 12.7* 12.6*  HGB 8.3* 8.2* 8.6* 8.6*  HCT 24.9* 24.6* 27.2* 27.3*  MCV 87.7 88.2 92.8 92.5  PLT 230 213 239 235      Medications:    . amLODipine  5 mg Oral Daily  . carvedilol  6.25 mg Oral BID WC  . Chlorhexidine Gluconate Cloth  6 each Topical Daily  . Chlorhexidine Gluconate Cloth  6 each Topical Q0600  . coumadin book   Does not apply Once  . darbepoetin (ARANESP) injection - NON-DIALYSIS  60 mcg Subcutaneous Q Mon-1800  . feeding supplement (NEPRO CARB STEADY)  237 mL Oral TID BM  . guaiFENesin  600 mg Oral BID  . insulin aspart  0-5 Units Subcutaneous QHS  . insulin aspart  0-9 Units Subcutaneous TID WC  . insulin aspart  2 Units Subcutaneous TID WC  . insulin glargine  4 Units Subcutaneous Daily  . levETIRAcetam  500 mg Oral BID  . multivitamin  1 tablet Oral QHS  . senna-docusate  1 tablet Oral BID  .  umeclidinium-vilanterol  1 puff Inhalation Daily  . warfarin  2 mg Oral q1800  . Warfarin - Pharmacist Dosing Inpatient   Does not apply q1800     Madelon Lips, MD 01/10/2020, 11:32 AM

## 2020-01-10 NOTE — Progress Notes (Signed)
PROGRESS NOTE    Kyle [name]  FTD:[id number] DOB: 1951-02-04 DOA: 12/24/2019 PCP: Alonna Buckler, MD   Brief Narrative: As pr HPI:  34 yom history of heavy smoking, COPD, peripheral vascular disease presenting with nausea, intermittent vomiting, abd cramping/pain and diarrhea for 1 week found to have acute renal failure, hyperkalemia and severe lactic acidosis of unclear etiology. Patient was intubated on 12/25/2019 and extubated on 12/26/2019.  Patient back on baseline O2 and following commands appropriately.  He is deconditioned weak and frail.  Subjective:  Seen this morning no new complaints.  Had significant good urine output 275 0 mL. BUN down to 73 creatinine 6.7.  Assessment & Plan:  Acute renal failure on CKD stage IIIa/metabolic acidosis/hyperphosphatemia: in the setting of volume depletion/vomiting diarrhea, ACE inhibitor.  Baseline creatinine 1.5.  Seen by nephrology placed on HD 2/28 last HD on 3/8, 3/13-access-right IJ TDC.Noted nephrology plan to watch closely-AKI HD is in process, hoping for renal recovery-and today's lab and urine output are reassuring.  Plan to repeat labs in the morning.  Recent Labs  Lab 01/06/20 0517 01/07/20 0410 01/08/20 1012 01/09/20 0334 01/10/20 0620  BUN 103* 50* 64* 73* 73*  CREATININE 10.10* 6.09* 7.10* 7.28* 0.62*   Acute metabolic encephalopathy: Uremic encephalopathy,seizure in the setting of significant uremia.  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  Had ammonia level high at 56.  Initially needed intubation subsequently extubated 3/2, CT head and MRI head no acute intracranial finding.  EEG was negative.  Seen by neurology, placed on Keppra twice daily.  Currently he is at baseline mental status and appropriate  Chronic hypoxic respiratory failure/COPD on home oxygen 4 to 5 L nasal cannula.  Continue the same.  Continue pulmonary support, bronchodilators and ambulation.  Intermittently patient is needing more oxygen.  Watch closely for  fluid overload, continue respiratory support.  He is on 5 L nasal cannula saturating 92-95%.  Left elbow pain/swelling-?gout w AKI/CKD related-swelling, significantly improving on prednisone 20 mg continue x5 days. X-ray unremarkable uric acid normal.Elevate the left arm.    Hypertension: Blood pressure fairly stable continue Norvasc and Coreg.  PAF/dilated cardiomyopathy/aortic stenosis/peripheral vascular disease: Patient has been lost to follow-up with cardiology in the past, with medication noncompliance.  Patient is reportedly was compliant with his medication at home recently.  On apixaban prior to admission now transitioned to Coumadin the setting of his acute kidney injury.Continue to monitor INR and pharmacy dosing.  Continue Coreg. INR therapeutic 2.7.  Hyperkalemia resolved.  Lactic acidosis/metabolic acidosis severe but resolved currently.  Vomiting diarrhea and abdominal pain/ volume depletion treated, currently stable.  CT abdomen and pelvis with renal stone protocol no source of patient's abdominal pain.  Overall symptoms have resolved.   Could be possibly from ischemic colitis per pccm. Coffee-ground emesis-resolved  Anemia with low iron storage:gave Feraheme 3/9 and also started on ESA.  Appreciate nephrology input. Recent Labs  Lab 01/05/20 0715 01/06/20 0517 01/07/20 0410 01/09/20 0334 01/10/20 0620  HGB 8.9* 8.3* 8.2* 8.6* 8.6*  HCT 26.3* 24.9* 24.6* 27.2* 27.3*   Right lower lung nodule 5 mm repeat CT scan in 12 months will be needed.  Follow-up with pulmonary outpatient.  Insulin-dependent diabetes mellitus A1c 8.0.Blood sugar was low in 50s/hypoglycemic previously likely due to  worsening renal failure and decreased insulin clearance.  Sugar stable .  Adjust Lantus to 6 units daily, and continue sliding scale insulin only.   Recent Labs  Lab 01/09/20 1127 01/09/20 1603 01/09/20 2103  01/10/20 0730 01/10/20 1145  GLUCAP 231* 233* 196* 131* 144*    Nutrition: Nutrition Problem: Inadequate oral intake Etiology: inability to eat Signs/Symptoms: NPO status Interventions: MVI, Nepro shake Body mass index is 23.52 kg/m.   DVT prophylaxis:warfarin Code Status:FULL Family Communication: plan of care discussed with patient at bedside. Disposition Plan: Patient is from:home Anticipated Disposition: to HHPT/Supervision for mobility Barriers to discharge or conditions that needs to be met prior to discharge: Patient remains in the hospital with acute renal failure, monitor renal functions for recovery and diaysis need.BUN creatinine worsens whle not getting dialysis, nephrology actively monitoring for need for outpatient AKI HD-which is in process.  Overall good renal recovery expecting home tomorrow if renal function is stable.   Consultants:   Interventional radiology: Dr. Earleen Newport  Neurology: Dr. Cheral Marker 12/24/2019  Nephrology: Dr. Jonnie Finner 12/24/2019  [facility] hospitalist: Dr. Lorin Mercy 12/24/2019  Procedures:   CT renal stone protocol 12/24/2019  Chest x-ray 12/24/2019, 12/25/2019  MRI brain MRA head 12/24/2019  Tunneled central venous HD catheter placement per IR 12/28/2019  Renal ultrasound 12/24/2019  EEG 12/24/2019--- suggestive of moderate to severe diffuse encephalopathy, nonspecific etiology.  Right IJ HD cath 12/24/2019  ETT 12/25/2019>>>> 12/26/2019  Microbiology:see note  Medications: Scheduled Meds: . amLODipine  5 mg Oral Daily  . carvedilol  6.25 mg Oral BID WC  . Chlorhexidine Gluconate Cloth  6 each Topical Daily  . Chlorhexidine Gluconate Cloth  6 each Topical Q0600  . coumadin book   Does not apply Once  . darbepoetin (ARANESP) injection - NON-DIALYSIS  60 mcg Subcutaneous Q Mon-1800  . feeding supplement (NEPRO CARB STEADY)  237 mL Oral TID BM  . guaiFENesin  600 mg Oral BID  . insulin aspart  0-5 Units Subcutaneous QHS  . insulin aspart  0-9 Units Subcutaneous TID WC  . insulin aspart  2 Units Subcutaneous TID  WC  . insulin glargine  4 Units Subcutaneous Daily  . levETIRAcetam  500 mg Oral BID  . multivitamin  1 tablet Oral QHS  . senna-docusate  1 tablet Oral BID  . umeclidinium-vilanterol  1 puff Inhalation Daily  . warfarin  2 mg Oral q1800  . Warfarin - Pharmacist Dosing Inpatient   Does not apply q1800   Continuous Infusions: . sodium chloride 250 mL (12/29/19 0532)    Antimicrobials: Anti-infectives (From admission, onward)   Start     Dose/Rate Route Frequency Ordered Stop   12/28/19 1400  ceFAZolin (ANCEF) IVPB 2g/100 mL premix     2 g 200 mL/hr over 30 Minutes Intravenous  Once 12/28/19 1347 12/28/19 1533       Objective: Vitals: Today's Vitals   01/10/20 0636 01/10/20 0756 01/10/20 0838 01/10/20 1004  BP: (!) 148/65   140/60  Pulse: 68   64  Resp: 20   20  Temp: 97.6 F (36.4 C)   97.6 F (36.4 C)  TempSrc: Oral   Oral  SpO2: 92% 94%  90%  Weight:      Height:      PainSc:   0-No pain     Intake/Output Summary (Last 24 hours) at 01/10/2020 1205 Last data filed at 01/10/2020 0900 Gross per 24 hour  Intake 1017 ml  Output 2350 ml  Net -1333 ml   Filed Weights   01/06/20 2106 01/07/20 1954 01/10/20 0500  Weight: 76.2 kg 76.2 kg 76.5 kg   Weight change:    Intake/Output from previous day: 03/16 0701 - 03/17 0700 In: 1117 [P.O.:880; NG/GT:237]  Out: 2750 [Urine:2750] Intake/Output this shift: Total I/O In: 240 [P.O.:240] Out: -   Examination:  General exam: AAO x3, not in acute distress, weak and frail. HEENT:Oral mucosa moist, Ear/Nose WNL grossly, dentition normal. Respiratory system: bilaterally diminished breath sounds, no respiratory distress or use of accessory muscles.   Cardiovascular system: S1 & S2 +, No JVD. Gastrointestinal system: Abdomen soft, NT,ND, BS+ Nervous System:Alert, awake, moving extremities and grossly nonfocal. Extremities: No edema, distal peripheral pulses palpable.  Mild elbow swelling and tenderness present. Skin: No  rashes,no icterus. MSK: Normal muscle bulk,tone, power Rt IJ HD cath present.  Data Reviewed: I have personally reviewed following labs and imaging studies CBC: Recent Labs  Lab 01/05/20 0715 01/06/20 0517 01/07/20 0410 01/09/20 0334 01/10/20 0620  WBC 14.1* 12.1* 10.4 12.7* 12.6*  HGB 8.9* 8.3* 8.2* 8.6* 8.6*  HCT 26.3* 24.9* 24.6* 27.2* 27.3*  MCV 87.7 87.7 88.2 92.8 92.5  PLT 237 230 213 239 719   Basic Metabolic Panel: Recent Labs  Lab 01/06/20 0517 01/07/20 0410 01/08/20 1012 01/09/20 0334 01/10/20 0620  NA 138 135 141 142 145  K 3.9 4.0 3.3* 3.8 3.5  CL 102 100 104 105 107  CO2 20* '24 24 23 24  '$ GLUCOSE 106* 176* 179* 190* 146*  BUN 103* 50* 64* 73* 73*  CREATININE 10.10* 6.09* 7.10* 7.28* 6.77*  CALCIUM 8.7* 8.2* 8.3* 8.5* 8.5*  PHOS 6.1* 4.7* 5.2* 5.3* 5.0*   GFR: Estimated Creatinine Clearance: 11.3 mL/min (A) (by C-G formula based on SCr of 6.77 mg/dL (H)). Liver Function Tests: Recent Labs  Lab 01/06/20 0517 01/07/20 0410 01/08/20 1012 01/09/20 0334 01/10/20 0620  ALBUMIN 2.2* 2.0* 2.2* 2.3* 2.2*   No results for input(s): LIPASE, AMYLASE in the last 168 hours. No results for input(s): AMMONIA in the last 168 hours. Coagulation Profile: Recent Labs  Lab 01/06/20 0517 01/07/20 0410 01/08/20 1012 01/09/20 0334 01/10/20 0620  INR 2.5* 2.4* 2.6* 2.7* 2.7*   Cardiac Enzymes: No results for input(s): CKTOTAL, CKMB, CKMBINDEX, TROPONINI in the last 168 hours. BNP (last 3 results) No results for input(s): PROBNP in the last 8760 hours. HbA1C: No results for input(s): HGBA1C in the last 72 hours. CBG: Recent Labs  Lab 01/09/20 1127 01/09/20 1603 01/09/20 2103 01/10/20 0730 01/10/20 1145  GLUCAP 231* 233* 196* 131* 144*   Lipid Profile: No results for input(s): CHOL, HDL, LDLCALC, TRIG, CHOLHDL, LDLDIRECT in the last 72 hours. Thyroid Function Tests: No results for input(s): TSH, T4TOTAL, FREET4, T3FREE, THYROIDAB in the last 72  hours. Anemia Panel: No results for input(s): VITAMINB12, FOLATE, FERRITIN, TIBC, IRON, RETICCTPCT in the last 72 hours. Sepsis Labs: No results for input(s): PROCALCITON, LATICACIDVEN in the last 168 hours.  No results found for this or any previous visit (from the past 240 hour(s)).    Radiology Studies: No results found.   LOS: 17 days   Time spent: More than 50% of that time was spent in counseling and/or coordination of care.  Antonieta Pert, MD [facility]  01/10/2020, 12:05 PM

## 2020-01-10 NOTE — Progress Notes (Signed)
Physical Therapy Treatment Patient Details Name: Kyle [name] MRN: [id number] DOB: Nov 13, 1950 Today's Date: 01/10/2020    History of Present Illness 69 y.o. M with significant history of heavy smoking, COPD, peripheral vascular disease presenting with nausea, intermittent vomiting, abd cramping/pain and diarrhea for 1 week found to have acute renal failure, hyperkalemia and severe lactic acidosis of unclear etiology. Intubated 12/25/2019-12/26/2019.    PT Comments    Pt ambulated without RW today, as pt declined RW and HHPT from CM. Pt ambulated good hallway distance with min verbal cuing for safety and required close guard given mild unsteadiness. PT encouraged pt to have wife mobilize with him initially upon d/c for pt safety and steadying assist as needed. VSS, pt with no dyspnea and spoke with PT throughout gait on 4LO2. Pt plans to d/c tomorrow.    Follow Up Recommendations  Home health PT;Supervision for mobility/OOB     Equipment Recommendations  None recommended by PT    Recommendations for Other Services       Precautions / Restrictions Precautions Precautions: Fall Precaution Comments: monitor for impulsivity, watch O2 Restrictions Weight Bearing Restrictions: No    Mobility  Bed Mobility Overal bed mobility: Needs Assistance Bed Mobility: Supine to Sit     Supine to sit: Supervision     General bed mobility comments: pt up in chair upon PT arrival to room, requests stay in chair upon PT exit.  Transfers Overall transfer level: Needs assistance Equipment used: None Transfers: Sit to/from Stand Sit to Stand: Min guard         General transfer comment: minguard for safety, increased time to rise  Ambulation/Gait Ambulation/Gait assistance: Supervision;Min guard Gait Distance (Feet): 150 Feet Assistive device: None Gait Pattern/deviations: Step-through pattern;Decreased stride length;Wide base of support;Narrow base of support Gait velocity: decr    General Gait Details: min guard to supervision for safety, varying BOS with mild unsteadiness noted during gait. Minimal to no DOE, no rest breaks required on 4LO2.   Stairs             Wheelchair Mobility    Modified Rankin (Stroke Patients Only)       Balance Overall balance assessment: Needs assistance Sitting-balance support: Feet supported Sitting balance-Leahy Scale: Good     Standing balance support: No upper extremity supported Standing balance-Leahy Scale: Good Standing balance comment: static standing without LOB;reaching outside BOS with single UE support                             Cognition Arousal/Alertness: Awake/alert Behavior During Therapy: WFL for tasks assessed/performed Overall Cognitive Status: Within Functional Limits for tasks assessed                                        Exercises Other Exercises Other Exercises: shoulder flexion AROM with coordinated pursed lip breathing x10    General Comments        Pertinent Vitals/Pain Pain Assessment: No/denies pain    Home Living                      Prior Function            PT Goals (current goals can now be found in the care plan section) Acute Rehab PT Goals Patient Stated Goal: go home Progress towards PT goals: Progressing toward goals  Frequency    Min 3X/week      PT Plan Equipment recommendations need to be updated    Co-evaluation              AM-PAC PT "6 Clicks" Mobility   Outcome Measure  Help needed turning from your back to your side while in a flat bed without using bedrails?: None Help needed moving from lying on your back to sitting on the side of a flat bed without using bedrails?: A Little Help needed moving to and from a bed to a chair (including a wheelchair)?: A Little Help needed standing up from a chair using your arms (e.g., wheelchair or bedside chair)?: A Little Help needed to walk in hospital room?:  A Little Help needed climbing 3-5 steps with a railing? : A Little 6 Click Score: 19    End of Session Equipment Utilized During Treatment: Oxygen Activity Tolerance: Patient tolerated treatment well Patient left: in chair;with call bell/phone within reach;with chair alarm set Nurse Communication: Mobility status PT Visit Diagnosis: Unsteadiness on feet (R26.81);Muscle weakness (generalized) (M62.81);Difficulty in walking, not elsewhere classified (R26.2)     Time: 9024-0973 PT Time Calculation (min) (ACUTE ONLY): 20 min  Charges:  $Gait Training: 8-22 mins                     Jorgia Manthei E, PT [place] Pager [phone]  Office [phone]  Michayla Mcneil D Elonda Husky 01/10/2020, 5:46 PM

## 2020-01-10 NOTE — Progress Notes (Addendum)
Occupational Therapy Treatment Patient Details Name: Kyle [name] MRN: [id number] DOB: [birth year] Today's Date: 01/10/2020    History of present illness 69 y.o. M with significant history of heavy smoking, COPD, peripheral vascular disease presenting with nausea, intermittent vomiting, abd cramping/pain and diarrhea for 1 week found to have acute renal failure, hyperkalemia and severe lactic acidosis of unclear etiology. Intubated 12/25/2019-12/26/2019.   OT comments  Pt progressing toward established OT goals. Upon arrival pt on 4lnc, SpO2 88%, pt unable to increase O2 with pursed lip breathing, bumped O2 to 5lnc, SpO2 93%. Pt progressed to EOB with supervision, SpO2 88% 5lnc. He required 2 min pursed lip breathing to increase O2 to >90%. Pt transferred to recliner with minguard assistance and completed general UE exercises with coordinated pursed lip breathing, SpO2 98% on 5lnc following exercise. Returned pt to recliner with 4lnc, pt able to maintain SpO2 >92%.  While sitting in recliner he required minA to don/doff socks. Educated pt on energy conservation strategies to assist with ADL completion. Pt will continue to benefit from skilled OT services to maximize safety and independence with ADL/IADL and functional mobility. Will continue to follow acutely and progress as tolerated.    Follow Up Recommendations  Home health OT;Supervision - Intermittent    Equipment Recommendations  Tub/shower bench    Recommendations for Other Services      Precautions / Restrictions Precautions Precautions: Fall Precaution Comments: monitor for impulsivity, watch O2 Restrictions Weight Bearing Restrictions: No       Mobility Bed Mobility Overal bed mobility: Needs Assistance Bed Mobility: Supine to Sit     Supine to sit: Supervision     General bed mobility comments: supervision for safety  Transfers Overall transfer level: Needs assistance Equipment used: Rolling walker (2  wheeled) Transfers: Sit to/from Stand Sit to Stand: Min guard         General transfer comment: minguard for safety    Balance Overall balance assessment: Needs assistance Sitting-balance support: Feet supported Sitting balance-Leahy Scale: Good     Standing balance support: Single extremity supported Standing balance-Leahy Scale: Fair Standing balance comment: static standing without LOB;reaching outside BOS with single UE support                            ADL either performed or assessed with clinical judgement   ADL Overall ADL's : Needs assistance/impaired Eating/Feeding: Independent           Lower Body Bathing: Min guard;Sit to/from stand       Lower Body Dressing: Minimal assistance;Sit to/from stand Lower Body Dressing Details (indicate cue type and reason): minA to don socks over feet Toilet Transfer: Min guard;Ambulation;RW   Toileting- Water quality scientist and Hygiene: Min guard;Sit to/from stand       Functional mobility during ADLs: Min guard;Rolling walker General ADL Comments: pt on 4L O2 upon arrival, SpO2 88% at rest, bumped O2 to 5Lnc in preparation for mobility;sitting EOB SpO2 92%, following transfer to chair SpO2 98% with standing UE ROM with coordinated breathing, returned o2 to 4Lnc SpO2 >92%     Vision   Vision Assessment?: No apparent visual deficits   Perception     Praxis      Cognition Arousal/Alertness: Awake/alert Behavior During Therapy: WFL for tasks assessed/performed Overall Cognitive Status: Within Functional Limits for tasks assessed  Exercises Exercises: Other exercises Other Exercises Other Exercises: shoulder flexion AROM with coordinated pursed lip breathing x10   Shoulder Instructions       General Comments      Pertinent Vitals/ Pain       Pain Assessment: No/denies pain  Home Living                                           Prior Functioning/Environment              Frequency  Min 2X/week        Progress Toward Goals  OT Goals(current goals can now be found in the care plan section)  Progress towards OT goals: Progressing toward goals  Acute Rehab OT Goals Patient Stated Goal: go home OT Goal Formulation: With patient Time For Goal Achievement: 01/13/20 Potential to Achieve Goals: Good ADL Goals Pt Will Perform Upper Body Bathing: sitting;with modified independence Pt Will Perform Lower Body Bathing: with modified independence;sit to/from stand Pt Will Perform Upper Body Dressing: with modified independence;sitting Pt Will Perform Lower Body Dressing: with modified independence;sit to/from stand Pt Will Transfer to Toilet: with modified independence;ambulating  Plan Discharge plan remains appropriate    Co-evaluation                 AM-PAC OT "6 Clicks" Daily Activity     Outcome Measure   Help from another person eating meals?: None Help from another person taking care of personal grooming?: A Little Help from another person toileting, which includes using toliet, bedpan, or urinal?: A Little Help from another person bathing (including washing, rinsing, drying)?: A Little Help from another person to put on and taking off regular upper body clothing?: A Little Help from another person to put on and taking off regular lower body clothing?: A Lot 6 Click Score: 18    End of Session Equipment Utilized During Treatment: Rolling walker;Gait belt;Oxygen  OT Visit Diagnosis: Unsteadiness on feet (R26.81)   Activity Tolerance Patient tolerated treatment well   Patient Left in chair;with call bell/phone within reach;with chair alarm set   Nurse Communication Mobility status        Time: 6381-7711 OT Time Calculation (min): 27 min  Charges: OT General Charges $OT Visit: 1 Visit OT Treatments $Self Care/Home Management : 23-37 mins  Helene Kelp OTR/L Acute  Rehabilitation Services Office: [place] 01/10/2020, 3:18 PM

## 2020-01-10 NOTE — Progress Notes (Signed)
ANTICOAGULATION CONSULT NOTE - Follow-Up  Pharmacy Consult for warfarin Indication: atrial fibrillation  No Known Allergies  Patient Measurements: Height: 5\' 11"  (180.3 cm) Weight: 168 lb 10.4 oz (76.5 kg) IBW/kg (Calculated) : 75.3  Heparin Dosing Weight: 70.6 kg  Vital Signs: Temp: 97.6 F (36.4 C) (03/17 0636) Temp Source: Oral (03/17 0636) BP: 148/65 (03/17 0636) Pulse Rate: 68 (03/17 0636)  Labs: Recent Labs    01/08/20 1012 01/09/20 0334 01/10/20 0620  HGB  --  8.6* 8.6*  HCT  --  27.2* 27.3*  PLT  --  239 235  LABPROT 27.5* 28.4* 28.9*  INR 2.6* 2.7* 2.7*  CREATININE 7.10* 7.28* 6.77*    Estimated Creatinine Clearance: 11.3 mL/min (A) (by C-G formula based on SCr of 6.77 mg/dL (H)).  Assessment: 69 yr old M on apixaban PTA for afib (CHADS2VASc = 3), last apixaban dose on 2/27, due to concern for coffee ground emesis, which has resolved. With worsening renal fxn, pharmacy was consulted to dose warfarin for Afib.  INR 2.7 therapeutic.   Will decrease INR frequency to MWF. Anticipate can maintain on 2mg  warfarin daily.   Goal of Therapy:  INR 2-3 Monitor platelets by anticoagulation protocol: Yes   Plan:  Ordered scheduled warfarin 2mg  daily, will adjust as needed Monitor QMWF INR, at least weekly CBC Monitor for signs/symptoms of bleeding   Thank you Anette Guarneri, PharmD  Please check AMION for all [facility] phone numbers After 10:00 PM, call [place]

## 2020-01-11 ENCOUNTER — Inpatient Hospital Stay (HOSPITAL_COMMUNITY): Payer: Medicare HMO

## 2020-01-11 HISTORY — PX: IR REMOVAL TUN CV CATH W/O FL: IMG2289

## 2020-01-11 LAB — RENAL FUNCTION PANEL
Albumin: 2.3 g/dL — ABNORMAL LOW (ref 3.5–5.0)
Anion gap: 15 (ref 5–15)
BUN: 76 mg/dL — ABNORMAL HIGH (ref 8–23)
CO2: 20 mmol/L — ABNORMAL LOW (ref 22–32)
Calcium: 8.4 mg/dL — ABNORMAL LOW (ref 8.9–10.3)
Chloride: 110 mmol/L (ref 98–111)
Creatinine, Ser: 6.09 mg/dL — ABNORMAL HIGH (ref 0.61–1.24)
GFR calc Af Amer: 10 mL/min — ABNORMAL LOW (ref >60)
GFR calc non Af Amer: 9 mL/min — ABNORMAL LOW (ref >60)
Glucose, Bld: 133 mg/dL — ABNORMAL HIGH (ref 70–99)
Phosphorus: 4.6 mg/dL (ref 2.5–4.6)
Potassium: 3.6 mmol/L (ref 3.5–5.1)
Sodium: 145 mmol/L (ref 135–145)

## 2020-01-11 LAB — GLUCOSE, CAPILLARY
Glucose-Capillary: 112 mg/dL — ABNORMAL HIGH (ref 70–99)
Glucose-Capillary: 114 mg/dL — ABNORMAL HIGH (ref 70–99)
Glucose-Capillary: 174 mg/dL — ABNORMAL HIGH (ref 70–99)

## 2020-01-11 MED ORDER — WARFARIN SODIUM 2 MG PO TABS: 2.0000 mg | ORAL_TABLET | Freq: Every day | ORAL | 0 refills | Status: DC

## 2020-01-11 MED ORDER — LEVETIRACETAM 500 MG PO TABS: 500.0000 mg | ORAL_TABLET | Freq: Two times a day (BID) | ORAL | 0 refills | Status: DC

## 2020-01-11 MED ORDER — LIDOCAINE HCL 1 % IJ SOLN
INTRAMUSCULAR | Status: AC
  Filled 2020-01-11: qty 20

## 2020-01-11 MED ORDER — CARVEDILOL 6.25 MG PO TABS: 6.2500 mg | ORAL_TABLET | Freq: Two times a day (BID) | ORAL | 1 refills | Status: DC

## 2020-01-11 MED ORDER — INSULIN GLARGINE 100 UNIT/ML ~~LOC~~ SOLN: 6.0000 [IU] | Freq: Every day | SUBCUTANEOUS | 11 refills | Status: DC

## 2020-01-11 MED ORDER — CHLORHEXIDINE GLUCONATE 4 % EX LIQD
CUTANEOUS | Status: DC | PRN
  Administered 2020-01-11: 1 via TOPICAL

## 2020-01-11 MED ORDER — NEPRO/CARBSTEADY PO LIQD: 237.0000 mL | Freq: Three times a day (TID) | ORAL | 0 refills | Status: AC

## 2020-01-11 MED ORDER — CHLORHEXIDINE GLUCONATE 4 % EX LIQD
CUTANEOUS | Status: AC
  Filled 2020-01-11: qty 15

## 2020-01-11 MED FILL — levETIRAcetam 500 MG TABS: 500 | 30 days supply | Qty: 60 | Fill #0

## 2020-01-11 MED FILL — CARVEDILOL 6.25 MG TABLET: 6.25 | 30 days supply | Qty: 60 | Fill #0

## 2020-01-11 MED FILL — WARFARIN SODIUM 2 MG TABLET: 2 | 30 days supply | Qty: 30 | Fill #0

## 2020-01-11 NOTE — Progress Notes (Signed)
DISCHARGE NOTE HOME Kyle [name] to be discharged Home per MD order. Discussed prescriptions and follow up appointments with the patient. Prescriptions given to patient; medication list explained in detail. Patient verbalized understanding.  Skin clean, dry and intact without evidence of skin break down, no evidence of skin tears noted. IV catheter discontinued intact. Site without signs and symptoms of complications. Dressing and pressure applied. Pt denies pain at the site currently. No complaints noted.  Patient free of lines, drains, and wounds.   An After Visit Summary (AVS) was printed and given to the patient. Patient escorted via wheelchair, and discharged home via private auto.  Arlyss Repress, RN

## 2020-01-11 NOTE — Progress Notes (Signed)
Patient brought to Radiology for tunneled HD catheter removal due to improvement in his renal function.   Catheter removed in its entirety without complication EBL 0 mL.   Brynda Greathouse, MS RD PA-C

## 2020-01-11 NOTE — Progress Notes (Signed)
[facility] Progress Note   Assessment/ Plan:   AoCKD 3 - in setting of vol depletion/ vomiting/ diarrhea and ACEi. B/l creat is 1.5.urine shows 100 of protein, few RBC -  HD started on 2/28- doing PRN,  Done  3/8 and then 3/13 -(VIR) placed RIJ TC;   - - BUN and crt increasing in between HD treatments but only needed 2 times last week- Mon ans Sat. UOP seems to be increasing -  since crt pretty good 5 weeks ago need to watch a little longer- daily labs- cont to watch but also need to see if he could get AKI HD as OP if does not show signs of recovery over the next few days. Looks like pt is recovering-- will d/c TDC and get OP followup in our office in a week with labs.  OK to go today  2. DM on insulin 3. Vol depletion -treated-  BP good on  norvasc 5 and coreg 6.25 BID 4. Lactic / metabolic acidosis - severethen resolved- worsening as renal function declines- better after HD.  5. COPD - on homeO2. 6. Anemia-  iron stores low, gave feraheme 3/9- second dose 3/12- started ESA 7. Bones-  Phos OK-  PTH 131 - no meds  Subjective:    Cr 6.1.  Excellent UOP.       Objective:   BP (!) 151/62 (BP Location: Left Arm)   Pulse 76   Temp 97.6 F (36.4 C) (Oral)   Resp 20   Ht 5\' 11"  (1.803 m)   Wt 76.5 kg   SpO2 90%   BMI 23.52 kg/m   Physical Exam: Gen: NAD CVS: RRR Resp: clear bilaterally, on chronic dose of O2 Abd: soft Ext: no LE edema ACCESS: R IJ TDC  Labs: BMET Recent Labs  Lab 01/05/20 0715 01/06/20 0517 01/07/20 0410 01/08/20 1012 01/09/20 0334 01/10/20 0620 01/11/20 0335  NA 137 138 135 141 142 145 145  K 4.2 3.9 4.0 3.3* 3.8 3.5 3.6  CL 102 102 100 104 105 107 110  CO2 20* 20* 24 24 23 24  20*  GLUCOSE 77 106* 176* 179* 190* 146* 133*  BUN 99* 103* 50* 64* 73* 73* 76*  CREATININE 9.66* 10.10* 6.09* 7.10* 7.28* 6.77* 6.09*  CALCIUM 8.7* 8.7* 8.2* 8.3* 8.5* 8.5* 8.4*  PHOS 5.7* 6.1* 4.7* 5.2* 5.3* 5.0* 4.6   CBC Recent Labs  Lab  01/06/20 0517 01/07/20 0410 01/09/20 0334 01/10/20 0620  WBC 12.1* 10.4 12.7* 12.6*  HGB 8.3* 8.2* 8.6* 8.6*  HCT 24.9* 24.6* 27.2* 27.3*  MCV 87.7 88.2 92.8 92.5  PLT 230 213 239 235      Medications:    . amLODipine  5 mg Oral Daily  . carvedilol  6.25 mg Oral BID WC  . Chlorhexidine Gluconate Cloth  6 each Topical Daily  . Chlorhexidine Gluconate Cloth  6 each Topical Q0600  . coumadin book   Does not apply Once  . darbepoetin (ARANESP) injection - NON-DIALYSIS  60 mcg Subcutaneous Q Mon-1800  . feeding supplement (NEPRO CARB STEADY)  237 mL Oral TID BM  . guaiFENesin  600 mg Oral BID  . insulin aspart  0-5 Units Subcutaneous QHS  . insulin aspart  0-9 Units Subcutaneous TID WC  . insulin glargine  6 Units Subcutaneous Daily  . levETIRAcetam  500 mg Oral BID  . multivitamin  1 tablet Oral QHS  . senna-docusate  1 tablet Oral BID  . umeclidinium-vilanterol  1 puff  Inhalation Daily  . warfarin  2 mg Oral q1800  . Warfarin - Pharmacist Dosing Inpatient   Does not apply q1800     Madelon Lips, MD 01/11/2020, 11:31 AM

## 2020-01-11 NOTE — Discharge Summary (Signed)
Physician Discharge Summary  Kyle [name] IRW:[id number] DOB: February 04, 1951 DOA: 12/24/2019  PCP: Kyle Buckler, MD  Admit date: 12/24/2019 Discharge date: 01/11/2020  Admitted From: home Disposition:  [facility]  Recommendations for Outpatient Follow-up:  1. Follow up with PCP in 1-2 weeks 2. Please obtain BMP/CBC in one week 3. Please follow up on the following pending results:  Home Health: yes  Equipment/Devices: none  Discharge Condition: Stable Code Status: full Diet recommendation: Heart Healthy  Brief/Interim Summary:  69 yom history of heavy smoking, COPD, peripheral vascular disease presenting with nausea, intermittent vomiting, abd cramping/pain and diarrhea for 1 week found to have acute renal failure, hyperkalemia and severe lactic acidosis of unclear etiology. Patient was intubated on 12/25/2019 and extubated on 12/26/2019. Patient back on baseline O2 and following commands appropriately.  He is deconditioned weak and frail. Patient needed intermittent dialysis.  He was watched closely by nephrology at this time no need for further dialysis making urine BUN/creatinine stabilizing although up nephrology plans to follow-up outpatient and remove dialysis catheter and Foley catheter and okay for discharge home today.  PT assessed for home health PT.  Discharge Diagnoses:   Acute renal failure on CKD stage IIIa/metabolic acidosis/hyperphosphatemia: in the setting of volume depletion/vomiting diarrhea, ACE inhibitor.  Baseline creatinine 1.5.  Seen by nephrology placed on HD 2/28 last HD on 3/8, 3/13-access-right IJ TDC.urine output 1470.  Overall labs stabilized discussion nephrology no need for further dialysis and they are going to follow-up in a week with lab.  Dialysis catheter is being discontinued.  Foley is being removed.  He has been cleared for discharge home. Recent Labs  Lab 01/07/20 0410 01/08/20 1012 01/09/20 0334 01/10/20 0620 01/11/20 0335  BUN 50* 64* 73* 73* 76*   CREATININE 6.09* 7.10* 7.28* 6.77* 7.61*   Acute metabolic encephalopathy: Uremic encephalopathy,seizure in the setting of significant uremia.  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  Had ammonia level high at 56.  Initially needed intubation subsequently extubated 3/2, CT head and MRI head no acute intracranial finding.  EEG was negative.  Seen by neurology, placed on Keppra twice daily.  Currently he is at baseline mental status and appropriate  Chronic hypoxic respiratory failure/COPD on home oxygen 4 to 5 L nasal cannula.  Continue the same.  Continue pulmonary support, bronchodilators and ambulation.  Intermittently patient is needing more oxygen.  Watch closely for fluid overload, continue respiratory support.  He is on 5 L nasal cannula saturating 90-95%.  Left elbow pain/swelling-?gout w AKI/CKD related-swelling, and pain improved with prednisone.  X-ray unremarkable uric acid normal.Elevate the left arm.    Hypertension: Blood pressure fairly stable continue Norvasc and Coreg.  PAF/dilated cardiomyopathy/aortic stenosis/peripheral vascular disease: Patient has been lost to follow-up with cardiology in the past, with medication noncompliance.  Patient is reportedly was compliant with his medication at home recently.  On apixaban prior to admission now transitioned to Coumadin the setting of his acute kidney injury.Continue to monitor INR and pharmacy dosing.  Continue Coreg. INR therapeutic follow-up with Coumadin clinic/PCP to monitor dose.  If renal function recovers may be able to go back on his previous Eliquis.  Hyperkalemia resolved.  Lactic acidosis/metabolic acidosis severe but resolved currently.  Vomiting diarrhea and abdominal pain/ volume depletion treated, currently stable.  CT abdomen and pelvis with renal stone protocol no source of patient's abdominal pain.  Overall symptoms have resolved.Could be possibly from ischemic colitis per pccm. Coffee-ground  emesis-resolved  Anemia with low iron storage:gave Feraheme  3/9 and also started on ESA.  Appreciate nephrology input.  fairly stable monitor.  Right lower lung nodule 5 mm repeat CT scan in 12 months will be needed.  Follow-up with pulmonary outpatient.  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  Nutrition: Nutrition Problem: Inadequate oral intake Etiology: inability to eat Signs/Symptoms: NPO status Interventions: MVI, Nepro shake Body mass index is 23.52 kg/m.   DVT prophylaxis:warfarin Code Status:FULL Family Communication: plan of care discussed with patient at bedside.  Have provided this instruction to patient's wife myself over the phone-patient regarding follow-up CT scan follow-up for PT/INR level in about changing back to Eliquis once renal function improves.  Disposition Plan: Patient is from:home Anticipated Disposition: to HHPT Barriers to discharge or conditions that needs to be met prior to discharge: Will be discharged home once his lines out Foley is removed and voiding trial completed.    Consultants:  Interventional radiology: Dr. Earleen Newport  Neurology: Dr. Cheral Marker 12/24/2019  Nephrology: Dr. Jonnie Finner 12/24/2019  [facility] hospitalist: Dr. Lorin Mercy 12/24/2019  Procedures:  CT renal stone protocol 12/24/2019  Chest x-ray 12/24/2019, 12/25/2019  MRI brain MRA head 12/24/2019  Tunneled central venous HD catheter placement per IR 12/28/2019  Renal ultrasound 12/24/2019  EEG 12/24/2019--- suggestive of moderate to severe diffuse encephalopathy, nonspecific etiology.  Right IJ HD cath 12/24/2019  ETT 12/25/2019>>>> 12/26/2019   Subjective: Seen comfortably denies nausea vomiting fever chills.  No elbow  pain.  Anxious and waiting eagerly for going home.  He has been asking on daily basis when he can go home.  Discharge Exam: Vitals:   01/11/20 0824 01/11/20 0947  BP:  (!) 151/62  Pulse:  76  Resp: 19 20  Temp:  97.6 F (36.4 C)  SpO2: 92% 90%   General: Pt is alert, awake, not in acute distress Cardiovascular: RRR, S1/S2 +, no rubs, no gallops Respiratory: CTA bilaterally, no wheezing, no rhonchi Abdominal: Soft, NT, ND, bowel sounds + Extremities: no edema, no cyanosis  Discharge Instructions  Discharge Instructions    Diet - low sodium heart healthy   Complete by: As directed    Discharge instructions   Complete by: As directed    You will need to follow-up with nephrology in a week follow-up chemistry panel for renal function monitoring.  Please follow-up with PCP/cardiology  or Coumadin clinic while you are on Coumadin for INR monitoring in next 2 to 3 days.  once your kidney functions improves please switch warfarin to Eliquis so please discuss with your cardiologist/nephrologist.  You were incidentally found to have a right lower lung nodule 5 mm and you will need repeat CT scan in 12 months.  Follow-up with pulmonary or pcp outpatient  Please call call MD or return to ER for similar or worsening recurring problem that brought you to hospital or if any fever,nausea/vomiting,abdominal pain, uncontrolled pain, chest pain,  shortness of breath or any other alarming symptoms.  Please follow-up your doctor as instructed in a week time and call the office for appointment.  Please avoid alcohol, smoking, or any other illicit substance and maintain healthy habits including taking your regular medications as prescribed.  You were cared for by a hospitalist during your hospital stay. If you have any questions about your discharge medications or the care you  received while you were in the hospital after you are discharged, you can call the unit and ask to speak with the hospitalist  on call if the hospitalist that took care of you is not available.  Once you are discharged, your primary care physician will handle any further medical issues. Please note that NO REFILLS for any discharge medications will be authorized once you are discharged, as it is imperative that you return to your primary care physician (or establish a relationship with a primary care physician if you do not have one) for your aftercare needs so that they can reassess your need for medications and monitor your lab values   Increase activity slowly   Complete by: As directed      Allergies as of 01/11/2020   No Known Allergies     Medication List    STOP taking these medications   apixaban 5 MG Tabs tablet Commonly known as: Eliquis   furosemide 40 MG tablet Commonly known as: LASIX   metFORMIN 500 MG tablet Commonly known as: GLUCOPHAGE   quinapril 10 MG tablet Commonly known as: ACCUPRIL     TAKE these medications   Accu-Chek FastClix Lancets Misc   Accu-Chek FastClix Lancets Misc USE TO MONITOR BLOOD GLUCOSE 2 TIMES DAILY   Accu-Chek Guide test strip Generic drug: glucose blood   Accu-Chek Guide test strip Generic drug: glucose blood one strip (1 each dose) by Other route 2 (two) times daily. Test blood glucose 2 times a day.   amLODipine 5 MG tablet Commonly known as: NORVASC Take 1 tablet (5 mg total) by mouth daily.   Anoro Ellipta 62.5-25 MCG/INH Aepb Generic drug: umeclidinium-vilanterol INHALE 1 PUFF BY MOUTH ONCE DAILY . APPOINTMENT REQUIRED FOR FUTURE REFILLS What changed: See the new instructions.   Anoro Ellipta 62.5-25 MCG/INH Aepb Generic drug: umeclidinium-vilanterol Inhale 1 puff into the lungs daily. What changed: Another medication with the same name was changed. Make sure you understand how and when to take each.   BD Pen Needle Nano U/F 32G X 4 MM Misc Generic drug: Insulin Pen Needle   BD Pen Needle Nano U/F 32G X 4 MM Misc Generic drug: Insulin  Pen Needle USE TO INJECT INTO THE SKIN DAILY.   carvedilol 6.25 MG tablet Commonly known as: COREG Take 1 tablet (6.25 mg total) by mouth 2 (two) times daily with a meal. What changed:   medication strength  how much to take   feeding supplement (NEPRO CARB STEADY) Liqd Take 237 mLs by mouth 3 (three) times daily between meals for 15 days.   guaiFENesin 600 MG 12 hr tablet Commonly known as: MUCINEX Take 2 tablets (1,200 mg total) by mouth 2 (two) times daily. What changed: how much to take   insulin glargine 100 UNIT/ML injection Commonly known as: LANTUS Inject 0.06 mLs (6 Units total) into the skin at bedtime. What changed: how much to take   levETIRAcetam 500 MG tablet Commonly known as: KEPPRA Take 1 tablet (500 mg total) by mouth 2 (two) times daily.   OXYGEN Inhale 4.5 L into the lungs continuous.   pneumococcal 13-valent conjugate vaccine Susp injection Commonly known as: PREVNAR 13 Inject into the muscle.   rosuvastatin 40 MG tablet Commonly known as: CRESTOR Take 1 tablet (40 mg total) by mouth daily.   warfarin 2 MG tablet Commonly known as: COUMADIN Take 1 tablet (2 mg total) by mouth daily at 6 PM.  Durable Medical Equipment  (From admission, onward)         Start     Ordered   12/31/19 1628  For home use only DME Walker rolling  Once    Question Answer Comment  Walker: With Anna Wheels   Patient needs a walker to treat with the following condition Debility      12/31/19 1628         Follow-up Information    Lauraine Rinne, NP Follow up on 01/29/2020.   Specialty: Pulmonary Disease Why: 9 AM Contact information: [address] [place] [address] [phone]        Madelon Lips, MD. Call in 2 day(s).   Specialty: Nephrology Why: Follow-up in 1 week with lab Contact information: [place] [address] [phone]        Kyle Buckler, MD Follow up.   Specialty: Family  Medicine Why: Follow-up in 2 days for INR check Contact information: [place] [address] [phone]          No Known Allergies  The results of significant diagnostics from this hospitalization (including imaging, microbiology, ancillary and laboratory) are listed below for reference.    Microbiology: No results found for this or any previous visit (from the past 240 hour(s)).  Procedures/Studies: EEG  Result Date: 12/25/2019 Lora Havens, MD     12/25/2019  1:52 PM Patient Name: Kyle [name] MRN: [id number] Epilepsy Attending: Lora Havens Referring Physician/Provider: Dr Karena Addison Aroor Date: 12/25/2019 Duration: 24.44 mins  Patient history: 69 year old male who presented to the hospital today with acute renal failure and AMS. Had sudden onset of worsened AMS with decreased movement. MRI Brain showed no acute finding. 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  Level of alertness: comatose  AEDs during EEG study: None  Technical aspects: This EEG study was done with scalp electrodes positioned according to the 10-20 International system of electrode placement. Electrical activity was acquired at a sampling rate of '500Hz'$  and reviewed with a high frequency filter of '70Hz'$  and a low frequency filter of '1Hz'$ . EEG data were recorded continuously and digitally stored.  DESCRIPTION:  EEG showed continuous generalized and lateralized right hemisphere 2-'3hz'$  rhythmic low amplitude delta slowing. Intermittent sharply contoured, generalized, maximal bifrontal 4-'5hz'$  theta slowing was also noted. EEG was reactive to noxious stimulation.  Hyperventilation and photic stimulation were not performed.  ABNORMALITY - Continuous rhythmic slow, generalized  IMPRESSION: This study is suggestive of profound diffuse encephalopathy, non specific to etiology.  No seizures or epileptiform discharges were seen throughout the recording. EEG appeared  similar to last night. Lora Havens   EEG  Result Date: 12/24/2019 Lora Havens, MD     12/24/2019 11:57 PM Patient Name: Kyle [name] MRN: [id number] Epilepsy Attending: Lora Havens Referring Physician/Provider: Dr Kerney Elbe Date: 12/24/2019 Duration: 25.25 mins Patient history: 69 year old male who presented to the hospital today with acute renal failure and AMS. Had sudden onset of worsened AMS with decreased movement. MRI Brain showed no acute finding. 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 Level of alertness: comatose AEDs during EEG study: None Technical aspects: This EEG study was done with scalp electrodes positioned according to the 10-20 International system of electrode placement. Electrical activity was acquired at a sampling rate of '500Hz'$  and reviewed with a high frequency filter of '70Hz'$   and a low frequency filter of '1Hz'$ . EEG data were recorded continuously and digitally stored. DESCRIPTION:  EEG showed continuous generalized 2-'3hz'$  rhythmic low amplitude delta slowing. Intermittent sharply contoured, generalized, maximal bifrontal 4-'5hz'$  theta slowing was also noted. EEG was reactive to noxious stimulation.  Hyperventilation and photic stimulation were not performed. ABNORMALITY - Continuous rhythmic slow, generalized IMPRESSION: This study is suggestive of profound diffuse encephalopathy, non specific to etiology.  No seizures or epileptiform discharges were seen throughout the recording. Dr Cheral Marker was notifed Lora Havens   DG Chest 1 View  Result Date: 01/07/2020 CLINICAL DATA:  COPD EXAM: CHEST  1 VIEW COMPARISON:  12/25/2019 FINDINGS: Mild cardiomegaly. Large-bore right neck multi lumen vascular catheter. Mild, diffuse interstitial pulmonary opacity. Probable small, persistent layering pleural effusions. Underlying emphysema. The visualized skeletal structures are unremarkable. IMPRESSION: 1. Mild cardiomegaly with mild  interstitial pulmonary edema and effusions. 2.  Emphysema (ICD10-J43.9). Electronically Signed   By: Eddie Candle M.D.   On: 01/07/2020 11:51   DG Elbow 2 Views Left  Result Date: 01/04/2020 CLINICAL DATA:  Left elbow pain and swelling.  No known injury. EXAM: LEFT ELBOW - 2 VIEW COMPARISON:  None. FINDINGS: There is no evidence of fracture, dislocation, or joint effusion. There is no evidence of arthropathy or other focal bone abnormality. Soft tissues are unremarkable. IMPRESSION: Negative. Electronically Signed   By: Kerby Moors M.D.   On: 01/04/2020 11:40   MR ANGIO HEAD WO CONTRAST  Result Date: 12/24/2019 CLINICAL DATA:  Altered mental status. Bilateral lower extremity weakness. EXAM: MRI HEAD WITHOUT CONTRAST MRA HEAD WITHOUT CONTRAST TECHNIQUE: Multiplanar, multiecho pulse sequences of the brain and surrounding structures were obtained without intravenous contrast. Angiographic images of the head were obtained using MRA technique without contrast. COMPARISON:  Head CT same day. FINDINGS: MRI HEAD FINDINGS Brain: Diffusion imaging does not show any acute or subacute infarction. No abnormality is seen affecting the brainstem or cerebellum. Cerebral hemispheres show symmetric old white matter infarctions in the posterior frontal white matter and an old tiny left posterior frontal cortical infarction. No large vessel territory infarction. No mass lesion, hemorrhage, hydrocephalus or extra-axial collection. Vascular: Major vessels at the base of the brain show flow. Skull and upper cervical spine: Negative Sinuses/Orbits: Clear/normal Other: None MRA HEAD FINDINGS Both internal carotid arteries are patent through the skull base and siphon regions. The anterior and middle cerebral vessels show flow. There is considerable motion degradation. That could potentially be multiple stenoses in the anterior and middle cerebral arteries. Both vertebral arteries are patent to the basilar. Posterior circulation  branch vessels show flow. There could possibly be stenoses within the PCA branches. IMPRESSION: MRI head: No acute finding. 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 MRA head: Study is markedly degraded by motion. No large vessel occlusion. Potential for multiple stenoses in the anterior, middle and posterior cerebral artery territories. Detail markedly limited by motion. Electronically Signed   By: Nelson Chimes M.D.   On: 12/24/2019 22:31   MR BRAIN WO CONTRAST  Result Date: 12/24/2019 CLINICAL DATA:  Altered mental status. Bilateral lower extremity weakness. EXAM: MRI HEAD WITHOUT CONTRAST MRA HEAD WITHOUT CONTRAST TECHNIQUE: Multiplanar, multiecho pulse sequences of the brain and surrounding structures were obtained without intravenous contrast. Angiographic images of the head were obtained using MRA technique without contrast. COMPARISON:  Head CT same day. FINDINGS: MRI HEAD FINDINGS Brain: Diffusion imaging does not show any  acute or subacute infarction. No abnormality is seen affecting the brainstem or cerebellum. Cerebral hemispheres show symmetric old white matter infarctions in the posterior frontal white matter and an old tiny left posterior frontal cortical infarction. No large vessel territory infarction. No mass lesion, hemorrhage, hydrocephalus or extra-axial collection. Vascular: Major vessels at the base of the brain show flow. Skull and upper cervical spine: Negative Sinuses/Orbits: Clear/normal Other: None MRA HEAD FINDINGS Both internal carotid arteries are patent through the skull base and siphon regions. The anterior and middle cerebral vessels show flow. There is considerable motion degradation. That could potentially be multiple stenoses in the anterior and middle cerebral arteries. Both vertebral arteries are patent to the basilar. Posterior circulation branch vessels show flow.  There could possibly be stenoses within the PCA branches. IMPRESSION: MRI head: No acute finding. 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 MRA head: Study is markedly degraded by motion. No large vessel occlusion. Potential for multiple stenoses in the anterior, middle and posterior cerebral artery territories. Detail markedly limited by motion. Electronically Signed   By: Nelson Chimes M.D.   On: 12/24/2019 22:31   US RENAL  Result Date: 12/24/2019 CLINICAL DATA:  Chronic kidney disease. EXAM: RENAL / URINARY TRACT ULTRASOUND COMPLETE COMPARISON:  CT 12/24/2019 FINDINGS: Right Kidney: Renal measurements: 8.6 x 3.9 x 4.7 cm = volume: 82 mL. Increased renal echogenicity. Left Kidney: Renal measurements: 11.3 x 5.6 x 5.8 cm = volume: 196 mL. Increased renal echogenicity. Bladder: Appears normal for degree of bladder distention. Other: Trace left perinephric fluid identified. Mild limitations secondary to patient's shortness of breath. IMPRESSION: 1. Increased renal echogenicity, suggesting medical renal disease. Mildly small right kidney relative to the left. 2. Trace left perinephric fluid, likely related to renal insufficiency. Electronically Signed   By: Abigail Miyamoto M.D.   On: 12/24/2019 14:49   IR Fluoro Guide CV Line Right  Result Date: 12/28/2019 INDICATION: 69 year old male with a history of renal failure EXAM: TUNNELED CENTRAL VENOUS HEMODIALYSIS CATHETER PLACEMENT WITH ULTRASOUND AND FLUOROSCOPIC GUIDANCE MEDICATIONS: 2 g Ancef. The antibiotic was given in an appropriate time interval prior to skin puncture. ANESTHESIA/SEDATION: Moderate (conscious) sedation was employed during this procedure. A total of Versed 1.0 mg and Fentanyl 50 mcg was administered intravenously. Moderate Sedation Time: 11 minutes. The patient's level of consciousness and vital signs were monitored continuously by radiology  nursing throughout the procedure under my direct supervision. FLUOROSCOPY TIME:  Fluoroscopy Time: 0 minutes 12 seconds (1 mGy). COMPLICATIONS: None PROCEDURE: The procedure, risks, benefits, and alternatives were explained to the patient. Questions regarding the procedure were encouraged and answered. The patient understands and consents to the procedure. The right neck and chest was prepped with chlorhexidine, and draped in the usual sterile fashion using maximum barrier technique (cap and mask, sterile gown, sterile gloves, large sterile sheet, hand hygiene and cutaneous antiseptic). Antibiotic prophylaxis was initiated with 2.0g Ancef administered IV within 1 hour prior to skin incision. Local anesthesia was attained by infiltration with 1% lidocaine without epinephrine. Ultrasound demonstrated patency of the right internal jugular vein, and this was documented with an image. Under real-time ultrasound guidance, this vein was accessed with a 21 gauge micropuncture needle and image documentation was performed. A small dermatotomy was made at the access site with an 11 scalpel. A 0.018" wire was advanced into the SVC and the access needle exchanged for a 28F micropuncture vascular sheath. The 0.018"  wire was then removed and a 0.035" wire advanced into the IVC. Upon withdrawal of the 018 wire, the wire was marked for appropriate length of the internal portion of the catheter. A 19 cm tip to cuff catheter was selected. The skin and subcutaneous tissues of the right chest wall were then generously infiltrated with 1% lidocaine without epinephrine from the chest wall to the puncture site at the right IJ. The hemodialysis catheter was then back tunneled from the right chest wall incision to the dermatotomy at the right neck. The venous access site was then serially dilated and a peel away vascular sheath placed over the wire. The wire was removed and the catheter was placed through the peel-away sheath. The catheter  tip is positioned in the upper right atrium. This was documented with a spot image. Both ports of the hemodialysis catheter were then tested for excellent function. The ports were then locked with heparinized lock. The incision at the neck was then sealed with Dermabond. Patient tolerated the procedure well and remained hemodynamically stable throughout. No complications were encountered and no significant blood loss was encountered. IMPRESSION: Status post right IJ tunneled hemodialysis catheter. Catheter ready for use. Signed, Dulcy Fanny. Dellia Nims, RPVI [facility] Electronically Signed   By: Corrie Mckusick D.O.   On: 12/28/2019 16:22   IR Removal Tun Cv Cath W/O FL  Result Date: 01/11/2020 INDICATION: Patient with acute on chronic kidney disease requiring short initiation of hemodialysis via right tunneled dialysis catheter placed 12/28/2019 by Dr. Earleen Newport. His kidney function has improved and he is no longer in need of his dialysis catheter. Request is made for removal. EXAM: REMOVAL TUNNELED CENTRAL VENOUS CATHETER MEDICATIONS: None COMPLICATIONS: None immediate. PROCEDURE: Informed written consent was obtained from the patient after a thorough discussion of the procedural risks, benefits and alternatives. All questions were addressed. Maximal Sterile Barrier Technique was utilized including caps, mask, sterile gowns, sterile gloves, sterile drape, hand hygiene and skin antiseptic. A timeout was performed prior to the initiation of the procedure. The patient's right chest and catheter was prepped and draped in a normal sterile fashion. Heparin was removed from both ports of catheter. 1% lidocaine was used for local anesthesia. Using gentle blunt dissection the cuff of the catheter was exposed and the catheter was removed in its entirety. Pressure was held till hemostasis was obtained. A sterile dressing was applied. The patient tolerated the procedure  well with no immediate complications. IMPRESSION: Successful catheter removal as described above. Read by: Brynda Greathouse PA-C Electronically Signed   By: Sandi Mariscal M.D.   On: 01/11/2020 13:24   IR US Guide Vasc Access Right  Result Date: 12/28/2019 INDICATION: 69 year old male with a history of renal failure EXAM: TUNNELED CENTRAL VENOUS HEMODIALYSIS CATHETER PLACEMENT WITH ULTRASOUND AND FLUOROSCOPIC GUIDANCE MEDICATIONS: 2 g Ancef. The antibiotic was given in an appropriate time interval prior to skin puncture. ANESTHESIA/SEDATION: Moderate (conscious) sedation was employed during this procedure. A total of Versed 1.0 mg and Fentanyl 50 mcg was administered intravenously. Moderate Sedation Time: 11 minutes. The patient's level of consciousness and vital signs were monitored continuously by radiology nursing throughout the procedure under my direct supervision. FLUOROSCOPY TIME:  Fluoroscopy Time: 0 minutes 12 seconds (1 mGy). COMPLICATIONS: None PROCEDURE: The procedure, risks, benefits, and alternatives were explained to the patient. Questions regarding the procedure were encouraged and answered. The patient understands and consents to the procedure. The right neck and chest was prepped with chlorhexidine,  and draped in the usual sterile fashion using maximum barrier technique (cap and mask, sterile gown, sterile gloves, large sterile sheet, hand hygiene and cutaneous antiseptic). Antibiotic prophylaxis was initiated with 2.0g Ancef administered IV within 1 hour prior to skin incision. Local anesthesia was attained by infiltration with 1% lidocaine without epinephrine. Ultrasound demonstrated patency of the right internal jugular vein, and this was documented with an image. Under real-time ultrasound guidance, this vein was accessed with a 21 gauge micropuncture needle and image documentation was performed. A small dermatotomy was made at the access site with an 11 scalpel. A 0.018" wire was advanced into  the SVC and the access needle exchanged for a 53F micropuncture vascular sheath. The 0.018" wire was then removed and a 0.035" wire advanced into the IVC. Upon withdrawal of the 018 wire, the wire was marked for appropriate length of the internal portion of the catheter. A 19 cm tip to cuff catheter was selected. The skin and subcutaneous tissues of the right chest wall were then generously infiltrated with 1% lidocaine without epinephrine from the chest wall to the puncture site at the right IJ. The hemodialysis catheter was then back tunneled from the right chest wall incision to the dermatotomy at the right neck. The venous access site was then serially dilated and a peel away vascular sheath placed over the wire. The wire was removed and the catheter was placed through the peel-away sheath. The catheter tip is positioned in the upper right atrium. This was documented with a spot image. Both ports of the hemodialysis catheter were then tested for excellent function. The ports were then locked with heparinized lock. The incision at the neck was then sealed with Dermabond. Patient tolerated the procedure well and remained hemodynamically stable throughout. No complications were encountered and no significant blood loss was encountered. IMPRESSION: Status post right IJ tunneled hemodialysis catheter. Catheter ready for use. Signed, Dulcy Fanny. Dellia Nims, RPVI [facility] Radiology Electronically Signed   By: Corrie Mckusick D.O.   On: 12/28/2019 16:22   DG CHEST PORT 1 VIEW  Result Date: 12/25/2019 CLINICAL DATA:  Status post intubation EXAM: PORTABLE CHEST 1 VIEW COMPARISON:  12/24/2019 FINDINGS: Right jugular central line is again seen and stable. Endotracheal tube is noted 5 cm above the carina. Gastric catheter extends into the stomach. Cardiac shadow is stable. Mild aortic calcifications are seen. The lungs are hyperinflated consistent with COPD. No focal  infiltrate is seen. IMPRESSION: Tubes and lines as described above. COPD without acute abnormality. Electronically Signed   By: Inez Catalina M.D.   On: 12/25/2019 01:35   DG Chest Port 1 View  Result Date: 12/24/2019 CLINICAL DATA:  Central line placement EXAM: PORTABLE CHEST 1 VIEW COMPARISON:  12/24/2019 at 2:09 p.m. FINDINGS: 6:10 p.m. Right internal jugular line tip at high SVC, without pneumothorax. Midline trachea. Borderline cardiomegaly. No pleural effusion or pneumothorax. Hyperinflation and interstitial thickening. Mild scarring or subsegmental atelectasis at the left lung base. IMPRESSION: Hyperinflation and chronic interstitial thickening, consistent with COPD/chronic bronchitis. New right internal jugular line, without pneumothorax. Electronically Signed   By: Abigail Miyamoto M.D.   On: 12/24/2019 18:38   DG CHEST PORT 1 VIEW  Result Date: 12/24/2019 CLINICAL DATA:  Acute kidney injury and shortness of breath. EXAM: PORTABLE CHEST 1 VIEW COMPARISON:  11/22/2018 and prior radiographs FINDINGS: Cardiomegaly, pulmonary vascular congestion and mild interstitial opacities noted. Mild bibasilar atelectasis is present. No definite pleural effusion or pneumothorax. No acute  bony abnormality noted. IMPRESSION: Cardiomegaly with pulmonary vascular congestion and question mild interstitial edema. Electronically Signed   By: Margarette Canada M.D.   On: 12/24/2019 14:48   Overnight EEG with video  Result Date: 12/26/2019 Lora Havens, MD     12/26/2019  3:29 PM Patient Name:Kyle [name] UMP:[id number] Epilepsy Attending:Priyanka [name] Referring Physician/Provider:Dr Karena Addison Aroor Duration: 12/25/2019 1127 to 12/26/2019 1054  Patient history:69 year old male who presented to the hospital today with acute renal failure and AMS. Had sudden onset of worsened AMS with decreased movement. 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   Level of alertness:awake/lethargic, asleep  AEDs during EEG study: LEV  Technical aspects: This EEG study was done with scalp electrodes positioned according to the 10-20 International system of electrode placement. Electrical activity was acquired at a sampling rate of '500Hz'$  and reviewed with a high frequency filter of '70Hz'$  and a low frequency filter of '1Hz'$ . EEG data were recorded continuously and digitally stored.  DESCRIPTION: During awake state, no clear posterior dominant rhythm was seen.  Sleep was characterized by vertex waves, maximal frontocentral.  EEG showed continuous generalized 3-'5Hz'$  theta-delta slowing.Hyperventilation and photic stimulation were not performed.  ABNORMALITY - Continuous rhythmic slow, generalized  IMPRESSION: This study issuggestive of moderate to severe diffuse encephalopathy, non specific to etiology.No seizures or epileptiform discharges were seen throughout the recording.  EEG appears to be improving compared to previous day. Lora Havens   ECHOCARDIOGRAM COMPLETE  Result Date: 12/13/2019    ECHOCARDIOGRAM REPORT   Patient Name:   ASHTAN GIRTMAN Date of Exam: 12/13/2019 Medical Rec #:  [id number]      Height:       71.0 in Accession #:    [id number]     Weight:       158.0 lb Date of Birth:  [birth year]      BSA:          1.91 m Patient Age:    62 years       BP:           144/74 mmHg Patient Gender: M              HR:           69 bpm. Exam Location:  [facility] Procedure: 2D Echo, Cardiac Doppler and Color Doppler Indications:    I50.21 CHF  History:        Patient has prior history of Echocardiogram examinations, most                 recent 11/21/2018. Cardiomyopathy, COPD, PAD and Pulmonary HTN,                 Signs/Symptoms:Shortness of Breath; Risk Factors:Hypertension                 and CKD.  Sonographer:    Marygrace Drought RCS Referring Phys: [id number] PETER M [place] IMPRESSIONS  1. Severe concentric LVH, consider evaluation for infiltrative  cardiomyopathies such as cardiac amyloidosis.  2. Impaired GLS: -14.6%. Left ventricular ejection fraction, by estimation, is 65 to 70%. The left ventricle has normal function. The left ventricle has no regional wall motion abnormalities. There is severe concentric left ventricular hypertrophy. Left  ventricular diastolic parameters are consistent with Grade I diastolic dysfunction (impaired relaxation).  3. Right ventricular systolic function is normal. The right ventricular size is normal. There is moderately elevated pulmonary artery  systolic pressure. The estimated right ventricular systolic pressure is 45.3 mmHg.  4. The mitral valve is normal in structure and function. No evidence of mitral valve regurgitation. No evidence of mitral stenosis.  5. The aortic valve is normal in structure and function. Aortic valve regurgitation is moderate. Mild to moderate aortic valve stenosis.  6. The inferior vena cava is normal in size with greater than 50% respiratory variability, suggesting right atrial pressure of 3 mmHg. FINDINGS  Left Ventricle: Impaired GLS: -14.6%. Left ventricular ejection fraction, by estimation, is 65 to 70%. The left ventricle has normal function. The left ventricle has no regional wall motion abnormalities. The left ventricular internal cavity size was normal in size. There is severe concentric left ventricular hypertrophy. Left ventricular diastolic parameters are consistent with Grade I diastolic dysfunction (impaired relaxation). Normal left ventricular filling pressure. Right Ventricle: The right ventricular size is normal. No increase in right ventricular wall thickness. Right ventricular systolic function is normal. There is moderately elevated pulmonary artery systolic pressure. The tricuspid regurgitant velocity is 3.88 m/s, and with an assumed right atrial pressure of 3 mmHg, the estimated right ventricular systolic pressure is 64.6 mmHg. Left Atrium: Left atrial size was normal in  size. Right Atrium: Right atrial size was normal in size. Pericardium: There is no evidence of pericardial effusion. Mitral Valve: The mitral valve is normal in structure and function. Normal mobility of the mitral valve leaflets. No evidence of mitral valve regurgitation. No evidence of mitral valve stenosis. Tricuspid Valve: The tricuspid valve is normal in structure. Tricuspid valve regurgitation is mild . No evidence of tricuspid stenosis. Aortic Valve: The aortic valve is normal in structure and function.. There is severe thickening and severe calcifcation of the aortic valve. Aortic valve regurgitation is moderate. Aortic regurgitation PHT measures 714 msec. Mild to moderate aortic stenosis is present. There is severe thickening of the aortic valve. There is severe calcifcation of the aortic valve. Aortic valve mean gradient measures 13.3 mmHg. Aortic valve peak gradient measures 22.9 mmHg. Aortic valve area, by VTI measures 1.92 cm. Pulmonic Valve: The pulmonic valve was normal in structure. Pulmonic valve regurgitation is not visualized. No evidence of pulmonic stenosis. Aorta: The aortic root is normal in size and structure. Venous: The inferior vena cava is normal in size with greater than 50% respiratory variability, suggesting right atrial pressure of 3 mmHg. IAS/Shunts: No atrial level shunt detected by color flow Doppler. Additional Comments: Severe concentric LVH, consider evaluation for infiltrative cardiomyopathies such as cardiac amyloidosis.  LEFT VENTRICLE PLAX 2D LVIDd:         3.84 cm  Diastology LVIDs:         2.56 cm  LV e' lateral:   7.29 cm/s LV PW:         1.69 cm  LV E/e' lateral: 5.3 LV IVS:        1.00 cm  LV e' medial:    5.22 cm/s LVOT diam:     2.20 cm  LV E/e' medial:  7.4 LV SV:         91.23 ml LV SV Index:   21.05 LVOT Area:     3.80 cm  RIGHT VENTRICLE RV Basal diam:  4.04 cm RV S prime:     11.60 cm/s TAPSE (M-mode): 2.2 cm RVSP:           63.2 mmHg LEFT ATRIUM              Index  RIGHT ATRIUM           Index LA diam:        3.10 cm 1.63 cm/m  RA Pressure: 3.00 mmHg LA Vol (A2C):   47.7 ml 25.01 ml/m RA Area:     17.10 cm LA Vol (A4C):   39.2 ml 20.55 ml/m RA Volume:   48.40 ml  25.37 ml/m LA Biplane Vol: 43.4 ml 22.75 ml/m  AORTIC VALVE AV Area (Vmax):    1.45 cm AV Area (Vmean):   1.43 cm AV Area (VTI):     1.92 cm AV Vmax:           239.33 cm/s AV Vmean:          173.000 cm/s AV VTI:            0.476 m AV Peak Grad:      22.9 mmHg AV Mean Grad:      13.3 mmHg LVOT Vmax:         91.60 cm/s LVOT Vmean:        65.000 cm/s LVOT VTI:          0.240 m LVOT/AV VTI ratio: 0.50 AI PHT:            714 msec  AORTA Ao Root diam: 3.40 cm MITRAL VALVE               TRICUSPID VALVE MV Area (PHT):             TR Peak grad:   60.2 mmHg MV Decel Time:             TR Vmax:        388.00 cm/s MV E velocity: 38.80 cm/s  Estimated RAP:  3.00 mmHg MV A velocity: 78.60 cm/s  RVSP:           63.2 mmHg MV E/A ratio:  0.49                            SHUNTS                            Systemic VTI:  0.24 m                            Systemic Diam: 2.20 cm Ena Dawley MD Electronically signed by Ena Dawley MD Signature Date/Time: 12/13/2019/11:41:52 AM    Final    CT Renal Stone Study  Result Date: 12/24/2019 CLINICAL DATA:  Mid abdominal pain with vomiting and diarrhea. EXAM: CT ABDOMEN AND PELVIS WITHOUT CONTRAST TECHNIQUE: Multidetector CT imaging of the abdomen and pelvis was performed following the standard protocol without IV contrast. COMPARISON:  None. FINDINGS: Lower chest: Heart is enlarged. No substantial pericardial effusion. Minimal left lower lobe collapse/consolidation noted. 5 mm right lower lobe pulmonary nodule visible on 37/5. Probable atelectasis in the paraspinal right lower lobe. Hepatobiliary: No focal abnormality in the liver on this study without intravenous contrast. There is no evidence for gallstones, gallbladder wall thickening, or pericholecystic fluid. No  intrahepatic or extrahepatic biliary dilation. Pancreas: No focal mass lesion. No dilatation of the main duct. No intraparenchymal cyst. No peripancreatic edema. Spleen: No splenomegaly. No focal mass lesion. Adrenals/Urinary Tract: No adrenal nodule or mass. No stones are seen in either kidney or ureter. No bladder stones mild right renal atrophy noted. Stomach/Bowel: Stomach is distended  with fluid. Duodenum is normally positioned as is the ligament of Treitz. No small bowel wall thickening. No small bowel dilatation. The terminal ileum is normal. The appendix is normal. No gross colonic mass. No colonic wall thickening. Diverticular changes are noted in the left colon without evidence of diverticulitis. Vascular/Lymphatic: Status post aorto bi femoral bypass graft. There is no gastrohepatic or hepatoduodenal ligament lymphadenopathy. No retroperitoneal or mesenteric lymphadenopathy. No pelvic sidewall lymphadenopathy. Reproductive: The prostate gland and seminal vesicles are unremarkable. Other: No intraperitoneal free fluid. Musculoskeletal: No worrisome lytic or sclerotic osseous abnormality. IMPRESSION: 1. No acute findings in the abdomen or pelvis. 2. Minimal left lower lobe collapse/consolidation. 3. 5 mm right lower lobe pulmonary nodule. No follow-up needed if patient is low-risk. Non-contrast chest CT can be considered in 12 months if patient is high-risk. This recommendation follows the consensus statement: Guidelines for Management of Incidental Pulmonary Nodules Detected on CT Images: From the Fleischner Society 2017; Radiology 2017; 284:228-243. 4.  Aortic Atherosclerois (ICD10-170.0) Electronically Signed   By: Misty Stanley M.D.   On: 12/24/2019 07:10   CT HEAD CODE STROKE WO CONTRAST`  Result Date: 12/24/2019 CLINICAL DATA:  Code stroke. Altered mental status. Bilateral lower extremity weakness. EXAM: CT HEAD WITHOUT CONTRAST TECHNIQUE: Contiguous axial images were obtained from the base of the  skull through the vertex without intravenous contrast. COMPARISON:  11/20/2018 FINDINGS: Brain: No evidence of accelerated brain atrophy. No evidence of old or acute small or large vessel infarction. No mass lesion, hemorrhage, hydrocephalus or extra-axial collection. Vascular: There is atherosclerotic calcification of the major vessels at the base of the brain. Skull: Negative Sinuses/Orbits: Clear sinuses. Orbits negative. Old medial wall blowout fracture on the right. Other: None ASPECTS ([place] Stroke Program Early CT Score) - Ganglionic level infarction (caudate, lentiform nuclei, internal capsule, insula, M1-M3 cortex): 7 - Supraganglionic infarction (M4-M6 cortex): 3 Total score (0-10 with 10 being normal): 10 IMPRESSION: 1. No acute finding. Normal appearing brain for age. Extensive atherosclerotic calcification of the major vessels at the base of the brain. 2. ASPECTS is 10 3. These results were communicated to Dr. Cheral Marker at 9:16 pmon 2/28/2021by text page via the [facility] messaging system. Electronically Signed   By: Nelson Chimes M.D.   On: 12/24/2019 21:18    Labs: BNP (last 3 results) No results for input(s): BNP in the last 8760 hours. Basic Metabolic Panel: Recent Labs  Lab 01/07/20 0410 01/08/20 1012 01/09/20 0334 01/10/20 0620 01/11/20 0335  NA 135 141 142 145 145  K 4.0 3.3* 3.8 3.5 3.6  CL 100 104 105 107 110  CO2 '24 24 23 24 '$ 20*  GLUCOSE 176* 179* 190* 146* 133*  BUN 50* 64* 73* 73* 76*  CREATININE 6.09* 7.10* 7.28* 6.77* 6.09*  CALCIUM 8.2* 8.3* 8.5* 8.5* 8.4*  PHOS 4.7* 5.2* 5.3* 5.0* 4.6   Liver Function Tests: Recent Labs  Lab 01/07/20 0410 01/08/20 1012 01/09/20 0334 01/10/20 0620 01/11/20 0335  ALBUMIN 2.0* 2.2* 2.3* 2.2* 2.3*   No results for input(s): LIPASE, AMYLASE in the last 168 hours. No results for input(s): AMMONIA in the last 168 hours. CBC: Recent Labs  Lab 01/05/20 0715 01/06/20 0517 01/07/20 0410 01/09/20 0334 01/10/20 0620  WBC 14.1*  12.1* 10.4 12.7* 12.6*  HGB 8.9* 8.3* 8.2* 8.6* 8.6*  HCT 26.3* 24.9* 24.6* 27.2* 27.3*  MCV 87.7 87.7 88.2 92.8 92.5  PLT 237 230 213 239 235   Cardiac Enzymes: No results for input(s): CKTOTAL, CKMB, CKMBINDEX, TROPONINI in  the last 168 hours. BNP: Invalid input(s): POCBNP CBG: Recent Labs  Lab 01/10/20 1700 01/10/20 2025 01/11/20 0633 01/11/20 0650 01/11/20 1133  GLUCAP 225* 313* 112* 114* 174*   D-Dimer No results for input(s): DDIMER in the last 72 hours. Hgb A1c No results for input(s): HGBA1C in the last 72 hours. Lipid Profile No results for input(s): CHOL, HDL, LDLCALC, TRIG, CHOLHDL, LDLDIRECT in the last 72 hours. Thyroid function studies No results for input(s): TSH, T4TOTAL, T3FREE, THYROIDAB in the last 72 hours.  Invalid input(s): FREET3 Anemia work up No results for input(s): VITAMINB12, FOLATE, FERRITIN, TIBC, IRON, RETICCTPCT in the last 72 hours. Urinalysis    Component Value Date/Time   COLORURINE YELLOW 12/24/2019 1632   APPEARANCEUR CLEAR 12/24/2019 1632   LABSPEC 1.014 12/24/2019 1632   PHURINE 5.0 12/24/2019 1632   GLUCOSEU 50 (A) 12/24/2019 1632   HGBUR MODERATE (A) 12/24/2019 1632   BILIRUBINUR NEGATIVE 12/24/2019 1632   KETONESUR 20 (A) 12/24/2019 1632   PROTEINUR 100 (A) 12/24/2019 1632   UROBILINOGEN 0.2 12/19/2014 0821   NITRITE NEGATIVE 12/24/2019 1632   LEUKOCYTESUR NEGATIVE 12/24/2019 1632   Sepsis Labs Invalid input(s): PROCALCITONIN,  WBC,  LACTICIDVEN Microbiology No results found for this or any previous visit (from the past 240 hour(s)).   Time coordinating discharge: 35  minutes  SIGNED: Antonieta Pert, MD  [facility] 01/11/2020, 2:25 PM  If 7PM-7AM, please contact night-coverage [email or web address]

## 2020-01-11 NOTE — TOC Transition Note (Signed)
Transition of Care [facility]) - CM/SW Discharge Note   Patient Details  Name: Kyle [name] MRN: [id number] Date of Birth: 12-04-1950  [facility] of [facility]) CM/SW Contact:  Bartholomew Crews, RN Phone Number: [phone] 01/11/2020, 3:00 PM   Clinical Narrative:    Acknowledging consult for PCP needs: coumadin clinic. Spoke with spouse and patient at bedside. Spouse has scheduled appointment with PCP office for coumadin check at 9:30 am tomorrow morning. No further TOC needs identified at this time. Patient to transition home today.   Final next level of care: Home/Self Care Barriers to Discharge: No Barriers Identified   Patient Goals and CMS Choice Patient states their goals for this hospitalization and ongoing recovery are:: return home with wife CMS Medicare.gov Compare Post Acute Care list provided to:: Patient Choice offered to / list presented to : Patient, Spouse  Discharge Placement                       Discharge Plan and Services In-house Referral: NA Discharge Planning Services: CM Consult Post Acute Care Choice: Durable Medical Equipment, Home Health          DME Arranged: N/A(refused RW) DME Agency: NA       HH Arranged: Refused [place] Agency: NA        Social Determinants of Health (SDOH) Interventions     Readmission Risk Interventions Readmission Risk Prevention Plan 04/26/2019  Transportation Screening Complete  PCP or Specialist Appt within 5-7 Days Complete  Home Care Screening Complete  Medication Review (RN CM) Complete  Some recent data might be hidden

## 2020-01-11 NOTE — Plan of Care (Signed)
  Problem: Activity: Goal: Risk for activity intolerance will decrease Outcome: Progressing   

## 2020-01-19 ENCOUNTER — Telehealth: Payer: Self-pay | Admitting: Pulmonary Disease

## 2020-01-19 NOTE — Telephone Encounter (Signed)
atc wife X2, no answer and no option for VM.  Wcb.

## 2020-01-20 ENCOUNTER — Inpatient Hospital Stay (HOSPITAL_COMMUNITY)
Admission: EM | Admit: 2020-01-20 | Discharge: 2020-01-24 | DRG: 291 | Disposition: A | Discharge status: Home / Self Care | Payer: Medicare HMO | Attending: Internal Medicine | Admitting: Internal Medicine

## 2020-01-20 ENCOUNTER — Encounter (HOSPITAL_COMMUNITY): Payer: Self-pay | Admitting: Emergency Medicine

## 2020-01-20 ENCOUNTER — Other Ambulatory Visit: Payer: Self-pay

## 2020-01-20 ENCOUNTER — Emergency Department (HOSPITAL_COMMUNITY): Payer: Medicare HMO

## 2020-01-20 DIAGNOSIS — Z79899 Other long term (current) drug therapy: Secondary | ICD-10-CM

## 2020-01-20 DIAGNOSIS — N1831 Chronic kidney disease, stage 3a: Secondary | ICD-10-CM | POA: Diagnosis present

## 2020-01-20 DIAGNOSIS — I5031 Acute diastolic (congestive) heart failure: Secondary | ICD-10-CM

## 2020-01-20 DIAGNOSIS — E1122 Type 2 diabetes mellitus with diabetic chronic kidney disease: Secondary | ICD-10-CM | POA: Diagnosis present

## 2020-01-20 DIAGNOSIS — D509 Iron deficiency anemia, unspecified: Secondary | ICD-10-CM | POA: Diagnosis present

## 2020-01-20 DIAGNOSIS — I130 Hypertensive heart and chronic kidney disease with heart failure and stage 1 through stage 4 chronic kidney disease, or unspecified chronic kidney disease: Principal | ICD-10-CM | POA: Diagnosis present

## 2020-01-20 DIAGNOSIS — I272 Pulmonary hypertension, unspecified: Secondary | ICD-10-CM | POA: Diagnosis present

## 2020-01-20 DIAGNOSIS — I509 Heart failure, unspecified: Secondary | ICD-10-CM

## 2020-01-20 DIAGNOSIS — Z20822 Contact with and (suspected) exposure to covid-19: Secondary | ICD-10-CM | POA: Diagnosis present

## 2020-01-20 DIAGNOSIS — R0902 Hypoxemia: Secondary | ICD-10-CM

## 2020-01-20 DIAGNOSIS — E785 Hyperlipidemia, unspecified: Secondary | ICD-10-CM | POA: Diagnosis present

## 2020-01-20 DIAGNOSIS — E1151 Type 2 diabetes mellitus with diabetic peripheral angiopathy without gangrene: Secondary | ICD-10-CM | POA: Diagnosis present

## 2020-01-20 DIAGNOSIS — Z7901 Long term (current) use of anticoagulants: Secondary | ICD-10-CM

## 2020-01-20 DIAGNOSIS — Z87891 Personal history of nicotine dependence: Secondary | ICD-10-CM

## 2020-01-20 DIAGNOSIS — N179 Acute kidney failure, unspecified: Secondary | ICD-10-CM | POA: Diagnosis present

## 2020-01-20 DIAGNOSIS — R0602 Shortness of breath: Secondary | ICD-10-CM | POA: Insufficient documentation

## 2020-01-20 DIAGNOSIS — J9621 Acute and chronic respiratory failure with hypoxia: Secondary | ICD-10-CM | POA: Diagnosis present

## 2020-01-20 DIAGNOSIS — J449 Chronic obstructive pulmonary disease, unspecified: Secondary | ICD-10-CM | POA: Diagnosis present

## 2020-01-20 DIAGNOSIS — Z833 Family history of diabetes mellitus: Secondary | ICD-10-CM

## 2020-01-20 DIAGNOSIS — I5033 Acute on chronic diastolic (congestive) heart failure: Secondary | ICD-10-CM | POA: Diagnosis present

## 2020-01-20 DIAGNOSIS — I5030 Unspecified diastolic (congestive) heart failure: Secondary | ICD-10-CM

## 2020-01-20 DIAGNOSIS — I420 Dilated cardiomyopathy: Secondary | ICD-10-CM | POA: Diagnosis present

## 2020-01-20 DIAGNOSIS — Z794 Long term (current) use of insulin: Secondary | ICD-10-CM

## 2020-01-20 DIAGNOSIS — I4891 Unspecified atrial fibrillation: Secondary | ICD-10-CM | POA: Diagnosis present

## 2020-01-20 DIAGNOSIS — Z9981 Dependence on supplemental oxygen: Secondary | ICD-10-CM

## 2020-01-20 DIAGNOSIS — E869 Volume depletion, unspecified: Secondary | ICD-10-CM | POA: Diagnosis present

## 2020-01-20 LAB — POCT I-STAT 7, (LYTES, BLD GAS, ICA,H+H)
Bicarbonate: 23.8 mmol/L (ref 20.0–28.0)
Calcium, Ion: 1.26 mmol/L (ref 1.15–1.40)
HCT: 26 % — ABNORMAL LOW (ref 39.0–52.0)
Hemoglobin: 8.8 g/dL — ABNORMAL LOW (ref 13.0–17.0)
O2 Saturation: 97 %
Patient temperature: 98.7
Potassium: 4.1 mmol/L (ref 3.5–5.1)
Sodium: 143 mmol/L (ref 135–145)
TCO2: 25 mmol/L (ref 22–32)
pCO2 arterial: 34.5 mmHg (ref 32.0–48.0)
pH, Arterial: 7.447 (ref 7.350–7.450)
pO2, Arterial: 84 mmHg (ref 83.0–108.0)

## 2020-01-20 LAB — IRON AND TIBC
Iron: 24 ug/dL — ABNORMAL LOW (ref 45–182)
Saturation Ratios: 12 % — ABNORMAL LOW (ref 17.9–39.5)
TIBC: 193 ug/dL — ABNORMAL LOW (ref 250–450)
UIBC: 169 ug/dL

## 2020-01-20 LAB — CBC
HCT: 31 % — ABNORMAL LOW (ref 39.0–52.0)
Hemoglobin: 9.3 g/dL — ABNORMAL LOW (ref 13.0–17.0)
MCH: 28.8 pg (ref 26.0–34.0)
MCHC: 30 g/dL (ref 30.0–36.0)
MCV: 96 fL (ref 80.0–100.0)
Platelets: 351 10*3/uL (ref 150–400)
RBC: 3.23 MIL/uL — ABNORMAL LOW (ref 4.22–5.81)
RDW: 18.2 % — ABNORMAL HIGH (ref 11.5–15.5)
WBC: 11.1 10*3/uL — ABNORMAL HIGH (ref 4.0–10.5)
nRBC: 0.3 % — ABNORMAL HIGH (ref 0.0–0.2)

## 2020-01-20 LAB — COMPREHENSIVE METABOLIC PANEL
ALT: 20 U/L (ref 0–44)
AST: 19 U/L (ref 15–41)
Albumin: 2.6 g/dL — ABNORMAL LOW (ref 3.5–5.0)
Alkaline Phosphatase: 84 U/L (ref 38–126)
Anion gap: 11 (ref 5–15)
BUN: 25 mg/dL — ABNORMAL HIGH (ref 8–23)
CO2: 23 mmol/L (ref 22–32)
Calcium: 9 mg/dL (ref 8.9–10.3)
Chloride: 110 mmol/L (ref 98–111)
Creatinine, Ser: 2.59 mg/dL — ABNORMAL HIGH (ref 0.61–1.24)
GFR calc Af Amer: 28 mL/min — ABNORMAL LOW (ref >60)
GFR calc non Af Amer: 25 mL/min — ABNORMAL LOW (ref >60)
Glucose, Bld: 250 mg/dL — ABNORMAL HIGH (ref 70–99)
Potassium: 4.2 mmol/L (ref 3.5–5.1)
Sodium: 144 mmol/L (ref 135–145)
Total Bilirubin: 0.8 mg/dL (ref 0.3–1.2)
Total Protein: 6.8 g/dL (ref 6.5–8.1)

## 2020-01-20 LAB — VITAMIN B12: Vitamin B-12: 429 pg/mL (ref 180–914)

## 2020-01-20 LAB — FOLATE: Folate: 38.6 ng/mL (ref >5.9)

## 2020-01-20 LAB — FERRITIN: Ferritin: 437 ng/mL — ABNORMAL HIGH (ref 24–336)

## 2020-01-20 LAB — BRAIN NATRIURETIC PEPTIDE: B Natriuretic Peptide: 138.7 pg/mL — ABNORMAL HIGH (ref 0.0–100.0)

## 2020-01-20 LAB — PROTIME-INR
INR: 1.7 — ABNORMAL HIGH (ref 0.8–1.2)
Prothrombin Time: 19.9 seconds — ABNORMAL HIGH (ref 11.4–15.2)

## 2020-01-20 LAB — CBG MONITORING, ED: Glucose-Capillary: 156 mg/dL — ABNORMAL HIGH (ref 70–99)

## 2020-01-20 LAB — SARS CORONAVIRUS 2 (TAT 6-24 HRS): SARS Coronavirus 2: NEGATIVE

## 2020-01-20 MED ORDER — INSULIN GLARGINE 100 UNIT/ML ~~LOC~~ SOLN
6.0000 [IU] | Freq: Every day | SUBCUTANEOUS | Status: DC
  Administered 2020-01-21: 6 [IU] via SUBCUTANEOUS
  Filled 2020-01-20 (×3): qty 0.06

## 2020-01-20 MED ORDER — ACETAMINOPHEN 650 MG RE SUPP: 650.0000 mg | Freq: Four times a day (QID) | RECTAL | Status: DC | PRN

## 2020-01-20 MED ORDER — WARFARIN - PHARMACIST DOSING INPATIENT: Freq: Every day | Status: DC

## 2020-01-20 MED ORDER — FUROSEMIDE 10 MG/ML IJ SOLN
40.0000 mg | Freq: Once | INTRAMUSCULAR | Status: AC
  Administered 2020-01-20: 40 mg via INTRAVENOUS
  Filled 2020-01-20: qty 4

## 2020-01-20 MED ORDER — LEVETIRACETAM 500 MG PO TABS
500.0000 mg | ORAL_TABLET | Freq: Two times a day (BID) | ORAL | Status: DC
  Administered 2020-01-20 – 2020-01-24 (×8): 500 mg via ORAL
  Filled 2020-01-20 (×8): qty 1

## 2020-01-20 MED ORDER — ACETAMINOPHEN 325 MG PO TABS
650.0000 mg | ORAL_TABLET | Freq: Four times a day (QID) | ORAL | Status: DC | PRN
  Administered 2020-01-23 (×2): 650 mg via ORAL
  Filled 2020-01-20 (×3): qty 2

## 2020-01-20 MED ORDER — CARVEDILOL 6.25 MG PO TABS: 6.2500 mg | ORAL_TABLET | Freq: Two times a day (BID) | ORAL | Status: DC

## 2020-01-20 MED ORDER — FUROSEMIDE 10 MG/ML IJ SOLN
40.0000 mg | Freq: Two times a day (BID) | INTRAMUSCULAR | Status: DC
  Administered 2020-01-21: 09:00:00 40 mg via INTRAVENOUS
  Filled 2020-01-20: qty 4

## 2020-01-20 MED ORDER — WARFARIN SODIUM 3 MG PO TABS
3.0000 mg | ORAL_TABLET | Freq: Once | ORAL | Status: AC
  Administered 2020-01-20: 3 mg via ORAL
  Filled 2020-01-20: qty 1

## 2020-01-20 MED ORDER — UMECLIDINIUM-VILANTEROL 62.5-25 MCG/INH IN AEPB
1.0000 | INHALATION_SPRAY | Freq: Every day | RESPIRATORY_TRACT | Status: DC
  Administered 2020-01-21 – 2020-01-24 (×4): 1 via RESPIRATORY_TRACT
  Filled 2020-01-20: qty 14

## 2020-01-20 MED ORDER — CARVEDILOL 6.25 MG PO TABS
6.2500 mg | ORAL_TABLET | Freq: Two times a day (BID) | ORAL | Status: DC
  Administered 2020-01-20 – 2020-01-22 (×5): 6.25 mg via ORAL
  Filled 2020-01-20 (×6): qty 1

## 2020-01-20 MED ORDER — INSULIN ASPART 100 UNIT/ML ~~LOC~~ SOLN
0.0000 [IU] | SUBCUTANEOUS | Status: DC
  Administered 2020-01-20 – 2020-01-21 (×3): 2 [IU] via SUBCUTANEOUS
  Administered 2020-01-21 – 2020-01-22 (×4): 1 [IU] via SUBCUTANEOUS
  Administered 2020-01-22: 2 [IU] via SUBCUTANEOUS
  Administered 2020-01-23: 07:00:00 1 [IU] via SUBCUTANEOUS
  Administered 2020-01-23: 3 [IU] via SUBCUTANEOUS
  Administered 2020-01-24: 1 [IU] via SUBCUTANEOUS

## 2020-01-20 MED ORDER — ALBUTEROL SULFATE HFA 108 (90 BASE) MCG/ACT IN AERS
2.0000 | INHALATION_SPRAY | RESPIRATORY_TRACT | Status: DC | PRN
  Filled 2020-01-20: qty 6.7

## 2020-01-20 MED ORDER — ROSUVASTATIN CALCIUM 5 MG PO TABS
10.0000 mg | ORAL_TABLET | Freq: Every day | ORAL | Status: DC
  Administered 2020-01-21 – 2020-01-23 (×3): 10 mg via ORAL
  Filled 2020-01-20 (×3): qty 2

## 2020-01-20 MED ORDER — AMLODIPINE BESYLATE 5 MG PO TABS
5.0000 mg | ORAL_TABLET | Freq: Every day | ORAL | Status: DC
  Administered 2020-01-20 – 2020-01-22 (×3): 5 mg via ORAL
  Filled 2020-01-20 (×3): qty 1

## 2020-01-20 MED ORDER — FUROSEMIDE 20 MG PO TABS: 20.0000 mg | ORAL_TABLET | Freq: Two times a day (BID) | ORAL | 0 refills | Status: DC

## 2020-01-20 MED ORDER — ALBUTEROL SULFATE (2.5 MG/3ML) 0.083% IN NEBU: 2.5000 mg | INHALATION_SOLUTION | RESPIRATORY_TRACT | Status: DC | PRN

## 2020-01-20 NOTE — ED Triage Notes (Addendum)
C/o SOB since being discharged from hospital on Thursday.  Denies cough.  Denies pain.  Sats 74% on room air.  Wears 4 liters at home.  Pt 93% on 6 liters [place].

## 2020-01-20 NOTE — ED Provider Notes (Signed)
[facility] EMERGENCY DEPARTMENT Provider Note   CSN: [id number] Arrival date & time: 01/20/20  1314     History Chief Complaint  Patient presents with  . Shortness of Breath    Kyle [name] is a 69 y.o. male.  HPI    level 5 caveat - patient appears not to have a good memory of recent events, requesting wife to bedside 69 yo male recent admission / d/c 3/25 fro acute renal failure, acute metabolic encephalopathy, acute on chrnoic hypoxic respiratory failure with increased oxygen requirement to 5 l presents now with worsening dyspnea, sats 93% on 6 liters.   Past Medical History:  Diagnosis Date  . Aortic insufficiency    MODERATE WITH A BICUSPID AORTIC VAVLE  . Arterial occlusive disease    MULTILEVEL  . CHF (congestive heart failure) ([place])   . COPD (chronic obstructive pulmonary disease) ([place])   . Dilated cardiomyopathy (HCC)    WITH EJECTION FRACTION DOWN TO 20-25%--WITH CONGESTIVE HEART FAILURE  . Edema    LOWER EXTREMETIES  . Hypertension   . Normal coronary arteries 2009  . Orthopnea   . Peripheral arterial disease ([place])   . Pulmonary hypertension ([place])   . SOB (shortness of breath)     Patient Active Problem List   Diagnosis Date Noted  . Acute metabolic encephalopathy   . Lactic acidosis 12/24/2019  . Uremia 12/24/2019  . High anion gap metabolic acidosis 40/98/1191  . Atrial fibrillation, chronic ([place]) 12/24/2019  . Diabetes mellitus type 2, insulin dependent ([place]) 12/24/2019  . Hyperkalemia, diminished renal excretion 12/24/2019  . Healthcare maintenance 07/14/2019  . Medication management 07/14/2019  . PAD (peripheral artery disease) ([place]) 04/22/2019  . Atherosclerosis of artery of extremity with rest pain ([place]) 04/21/2019  . COPD with chronic bronchitis and emphysema ([place]) 12/16/2018  . Chronic respiratory failure with hypoxia ([place]) 12/16/2018  . Acute renal failure ([place])   . Protein-calorie malnutrition, severe ([place]) 12/18/2014   . Aortoiliac occlusive disease ([place]) 12/16/2014  . PVD (peripheral vascular disease) ([place]) 10/24/2014  . Aortic stenosis 09/12/2014  . 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 Pulmonary hypertension ([place]) 09/12/2014  . Dyslipidemia 09/12/2014  . Atherosclerosis of native arteries of extremity with intermittent claudication ([place]) 05/11/2012  . Aortic insufficiency   . Hypertension   . Peripheral arterial disease ([place])   . Dilated cardiomyopathy ([place])   . Chronic systolic CHF (congestive heart failure) (HCC)     Past Surgical History:  Procedure Laterality Date  . ABDOMINAL AORTAGRAM N/A 11/05/2014   Procedure: ABDOMINAL Maxcine Ham;  Surgeon: Angelia Mould, MD;  Location: [facility] CATH LAB;  Service: Cardiovascular;  Laterality: N/A;  . ABDOMINAL AORTOGRAM W/LOWER EXTREMITY Bilateral 04/21/2019   Procedure: ABDOMINAL AORTOGRAM W/LOWER EXTREMITY;  Surgeon: Angelia Mould, MD;  Location: [facility] CV LAB;  Service: Cardiovascular;  Laterality: Bilateral;  . AORTA - BILATERAL FEMORAL ARTERY BYPASS GRAFT N/A 12/18/2014   Procedure: AORTOBIFEMORAL BYPASS GRAFT;  Surgeon: Angelia Mould, MD;  Location: [facility];  Service: Vascular;  Laterality: N/A;  . CARDIAC CATHETERIZATION  2009   Nl Cors, EF 20%  . COLONOSCOPY    . ENDARTERECTOMY FEMORAL Left 12/18/2014   Procedure: ENDARTERECTOMY FEMORAL;  Surgeon: Angelia Mould, MD;  Location: [facility];  Service: Vascular;  Laterality: Left;  . FALSE ANEURYSM REPAIR Right 04/24/2019   Procedure: REPAIR OF RIGHT FEMORAL ARTERY PSEUDOANEURYSM;  Surgeon: Rosetta Posner, MD;  Location: [facility];  Service: Vascular;  Laterality: Right;  .  FEMORAL-POPLITEAL BYPASS GRAFT  02/21/2011   right  . FEMORAL-TIBIAL BYPASS GRAFT Right 04/24/2019   Procedure: REDO BYPASS GRAFT RIGHT FEMORAL-TIBIAL ARTERY USING LEFT LEG VEIN & HEMASHIELD GOLD 63mm GRAFT;  Surgeon: Rosetta Posner, MD;  Location: [facility];  Service: Vascular;  Laterality: Right;  . IR  FLUORO GUIDE CV LINE RIGHT  12/28/2019  . IR REMOVAL TUN CV CATH W/O FL  01/11/2020  . IR US GUIDE VASC ACCESS RIGHT  12/28/2019  . OTHER SURGICAL HISTORY     POST RIGHT FEMOROPOPLITEAL BYPASS GRAFT  . PERIPHERAL VASCULAR CATHETERIZATION  11/05/2014   Procedure: LOWER EXTREMITY ANGIOGRAPHY;  Surgeon: Angelia Mould, MD;  Location: [facility] C[facility] LAB;  Service: Cardiovascular;;  . VEIN HARVEST Left 04/24/2019   Procedure: LEFT LEG SAPHENOUS VEIN HARVEST;  Surgeon: Rosetta Posner, MD;  Location: [facility] OR;  Service: Vascular;  Laterality: Left;       Family History  Problem Relation Age of Onset  . Diabetes Mother   . Hyperlipidemia Sister   . Hypertension Sister     Social History   Tobacco Use  . Smoking status: Former Smoker    Packs/day: 1.00    Years: 40.00    Pack years: 40.00    Quit date: 10/15/2018    Years since quitting: 1.2  . Smokeless tobacco: Never Used  . Tobacco comment: quit 2010 and quit again 2019  Substance Use Topics  . Alcohol use: No    Alcohol/week: 0.0 standard drinks  . Drug use: No    Home Medications Prior to Admission medications   Medication Sig Start Date End Date Taking? Authorizing Provider  ACCU-CHEK FASTCLIX LANCETS [place]  07/30/18   [provider]  Accu-Chek FastClix Lancets MISC USE TO MONITOR BLOOD GLUCOSE 2 TIMES DAILY 11/06/19   [provider]  ACCU-CHEK GUIDE test strip  11/30/18   [provider]  amLODipine (NORVASC) 5 MG tablet Take 1 tablet (5 mg total) by mouth daily. 01/05/19   [place], Peter M, MD  ANORO ELLIPTA 62.5-25 MCG/INH AEPB INHALE 1 PUFF BY MOUTH ONCE DAILY . APPOINTMENT REQUIRED FOR FUTURE REFILLS Patient taking differently: Inhale 1 puff into the lungs daily.  07/31/19   Rigoberto Noel, MD  BD PEN NEEDLE NANO U/F 32G X 4 MM MISC  08/06/18   [provider]  carvedilol (COREG) 6.25 MG tablet Take 1 tablet (6.25 mg total) by mouth 2 (two) times daily with a meal. 01/11/20 03/11/20  Antonieta Pert, MD    glucose blood (ACCU-CHEK GUIDE) test strip one strip (1 each dose) by Other route 2 (two) times daily. Test blood glucose 2 times a day. 11/21/19   [provider]  guaiFENesin (MUCINEX) 600 MG 12 hr tablet Take 2 tablets (1,200 mg total) by mouth 2 (two) times daily. Patient taking differently: Take 600 mg by mouth 2 (two) times daily.  11/30/18   Lavina Hamman, MD  insulin glargine (LANTUS) 100 UNIT/ML injection Inject 0.06 mLs (6 Units total) into the skin at bedtime. 01/11/20   Antonieta Pert, MD  Insulin Pen Needle (BD PEN NEEDLE NANO U/F) 32G X 4 MM MISC USE TO INJECT INTO THE SKIN DAILY. 11/06/19   [provider]  levETIRAcetam (KEPPRA) 500 MG tablet Take 1 tablet (500 mg total) by mouth 2 (two) times daily. 01/11/20 02/10/20  Antonieta Pert, MD  Nutritional Supplements (FEEDING SUPPLEMENT, NEPRO CARB STEADY,) LIQD Take 237 mLs by mouth 3 (three) times daily between meals for 15  days. 01/11/20 01/26/20  Antonieta Pert, MD  OXYGEN Inhale 4.5 L into the lungs continuous.    [provider]  pneumococcal 13-valent conjugate vaccine (PREVNAR 13) SUSP injection Inject into the muscle. 12/21/17   [provider]  rosuvastatin (CRESTOR) 40 MG tablet Take 1 tablet (40 mg total) by mouth daily. 12/01/19 02/29/20  [place], Peter M, MD  umeclidinium-vilanterol (ANORO ELLIPTA) 62.5-25 MCG/INH AEPB Inhale 1 puff into the lungs daily. 08/07/19   Lauraine Rinne, NP  warfarin (COUMADIN) 2 MG tablet Take 1 tablet (2 mg total) by mouth daily at 6 PM. 01/11/20 02/10/20  Antonieta Pert, MD    Allergies    Patient has no known allergies.  Review of Systems   Review of Systems  All other systems reviewed and are negative.   Physical Exam Updated Vital Signs BP (!) 176/84 (BP Location: Right Arm)   Pulse 94   Temp 98.7 F (37.1 C) (Oral)   Resp (!) 22   SpO2 90%   Physical Exam Vitals and nursing note reviewed.  Constitutional:      General: He is not in acute distress.    Appearance: He is  ill-appearing.  HENT:     Head: Normocephalic.     Mouth/Throat:     Mouth: Mucous membranes are moist.  Eyes:     Pupils: Pupils are equal, round, and reactive to light.  Pulmonary:     Effort: Tachypnea present.     Breath sounds: Examination of the left-lower field reveals rhonchi. Rhonchi present.  Chest:     Chest wall: No mass, deformity or crepitus.  Abdominal:     General: Bowel sounds are normal.     Palpations: Abdomen is soft.  Musculoskeletal:        General: Normal range of motion.  Skin:    General: Skin is warm.     Capillary Refill: Capillary refill takes less than 2 seconds.  Neurological:     General: No focal deficit present.     Mental Status: He is alert.  Psychiatric:        Mood and Affect: Mood normal.     ED Results / Procedures / Treatments   Labs (all labs ordered are listed, but only abnormal results are displayed) Labs Reviewed  SARS CORONAVIRUS 2 (TAT 6-24 HRS)  CBC  COMPREHENSIVE METABOLIC PANEL  BRAIN NATRIURETIC PEPTIDE  I-STAT ARTERIAL BLOOD GAS, ED    EKG EKG Interpretation  Date/Time:  Saturday January 20 2020 13:48:31 EDT Ventricular Rate:  100 PR Interval:    QRS Duration: 81 QT Interval:  334 QTC Calculation: 431 R Axis:   5 Text Interpretation: Sinus tachycardia Left atrial enlargement Abnormal R-wave progression, early transition Baseline wander in lead(s) V2 Confirmed by Pattricia Boss [phone]) on 01/20/2020 2:14:30 PM   Radiology DG Chest Port 1 View  Result Date: 01/20/2020 CLINICAL DATA:  Shortness of breath since Thursday EXAM: PORTABLE CHEST 1 VIEW COMPARISON:  January 07, 2020 FINDINGS: The mediastinal contour is normal. The heart size is enlarged. Mild increased pulmonary interstitium is identified bilaterally. Lungs are hyperinflated. The bony structures are stable. IMPRESSION: Mild congestive heart failure. Electronically Signed   By: Abelardo Diesel M.D.   On: 01/20/2020 14:41    Procedures .Critical Care Performed  by: Pattricia Boss, MD Authorized by: Pattricia Boss, MD   Critical care provider statement:    Critical care time (minutes):  45   Critical care end time:  01/20/2020 5:18 PM   Critical  care was time spent personally by me on the following activities:  Discussions with consultants, evaluation of patient's response to treatment, examination of patient, ordering and performing treatments and interventions, ordering and review of laboratory studies, ordering and review of radiographic studies, pulse oximetry, re-evaluation of patient's condition, obtaining history from patient or surrogate and review of old charts   (including critical care time)  Medications Ordered in ED Medications - No data to display  ED Course  I have reviewed the triage vital signs and the nursing notes.  Pertinent labs & imaging results that were available during my care of the patient were reviewed by me and considered in my medical decision making (see chart for details).  Clinical Course as of Jan 19 1717  Sat Jan 20, 2020  1718 Reviewed all labs and radiology studies as well as EKG personally.  Covid test is pending.   [DR]    Clinical Course User Index [DR] Pattricia Boss, MD   MDM Rules/Calculators/A&P                      69 year-old male history of recent admission for acute renal failure, metabolic encephalopathy, and respiratory failure.  He was discharged Thursday to home on 5 L nasal cannula.  He had increased dyspnea last night and had some decreased sats into the 80s.  Patient on 5 l/m here and sats mid 80s. At rest.  ABG ordered but done on nrb.  Patient given Lasix 40 mg here and feels somewhat improved.  However, sats continue 82 to 85% on 5 to 6 L.  Patient care discussed with Dr. Harl Bowie.  He agrees with restarting Lasix.  Initial review of blood gas that was ordered on 5 L showed PO2 in the 80s.  However upon calling respiratory therapy this was actually done on a nonrebreather and now that his oxygen  has been decreased back down to what he is on at home, he is again hypoxic. Patient will require inpatient evaluation, assessment, and medication adjustment. Additionally INR are subtherapeutic at 1.7. Hemoglobin is stable  Discussed with hospitalist team for admission.  Patient now at 90% sat on 5 L nasal cannula.  Discussed with patient and wife and they voiced understanding of plan.         . Final Clinical Impression(s) / ED Diagnoses Final diagnoses:  None    Rx / DC Orders ED Discharge Orders    None       Pattricia Boss, MD 01/20/20 1718

## 2020-01-20 NOTE — ED Notes (Signed)
Pt sats 80% on 6L , provided 9L NRB, now 93%. Wife now at bedside

## 2020-01-20 NOTE — ED Notes (Signed)
Called pt spouse, she is in parking lot and will come inside/check in with security now to provide additional information at bedside

## 2020-01-20 NOTE — ED Notes (Signed)
Got patient undress on the monitor did ekg shown to Dr Ray patient is resting with call bell in reach 

## 2020-01-20 NOTE — ED Notes (Signed)
Pt transitioned to 8L NRB, tolerates well. Unable to tolerate transition to 6L [place]

## 2020-01-20 NOTE — H&P (Addendum)
History and Physical    Kyle [name] WLN:[id number] DOB: July 31, 1951 DOA: 01/20/2020  PCP: Alonna Buckler, MD  Patient coming from: home  I have personally briefly reviewed patient's old medical records in [place]  Chief Complaint: shortness of breath  HPI: Kyle [name] is Kyle [name] 69 y.o. 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  He notes that after discharge, he was initially feeling well.  Kyle [name] few days after, became SOB.  This has been progressive.  He denies CP, fevers, chills, change in taste or smell, sick contacts, covid contacts, N/V/D.  Denies smoking or drinking.  His medications were changed at discharge (his eliquis was transitioned to warfarin while his renal function was decreased - in addition, his ACE and diuretic were being held - metformin was discontinued).     ED Course: labs, imaging, EKG, lasix.  Hospitalists called to admit for HF exacerbation.  Review of Systems: As per HPI otherwise 10 point review of systems negative.   Past Medical History:  Diagnosis Date  . Aortic insufficiency    MODERATE WITH Haig Gerardo BICUSPID AORTIC VAVLE  . Arterial occlusive disease    MULTILEVEL  . CHF (congestive heart failure) ([place])   . COPD (chronic obstructive pulmonary disease) ([place])   . Dilated cardiomyopathy (HCC)    WITH EJECTION FRACTION DOWN TO 20-25%--WITH CONGESTIVE HEART FAILURE  . Edema    LOWER EXTREMETIES  . Hypertension   . Normal coronary arteries 2009  . Orthopnea   . Peripheral arterial disease ([place])   . Pulmonary hypertension ([place])   . SOB (shortness of breath)     Past Surgical History:  Procedure Laterality Date  . ABDOMINAL AORTAGRAM N/Yasmyn Bellisario 11/05/2014   Procedure: ABDOMINAL  Maxcine Ham;  Surgeon: Angelia Mould, MD;  Location: [facility] CATH LAB;  Service: Cardiovascular;  Laterality: N/Gamal Todisco;  . ABDOMINAL AORTOGRAM W/LOWER EXTREMITY Bilateral 04/21/2019   Procedure: ABDOMINAL AORTOGRAM W/LOWER EXTREMITY;  Surgeon: Angelia Mould, MD;  Location: [place] CV LAB;  Service: Cardiovascular;  Laterality: Bilateral;  . AORTA - BILATERAL FEMORAL ARTERY BYPASS GRAFT N/Caleigh Rabelo 12/18/2014   Procedure: AORTOBIFEMORAL BYPASS GRAFT;  Surgeon: Angelia Mould, MD;  Location: [place];  Service: Vascular;  Laterality: N/Merced Brougham;  . CARDIAC CATHETERIZATION  2009   Nl Cors, EF 20%  . COLONOSCOPY    . ENDARTERECTOMY FEMORAL Left 12/18/2014   Procedure: ENDARTERECTOMY FEMORAL;  Surgeon: Angelia Mould, MD;  Location: [place];  Service: Vascular;  Laterality: Left;  . FALSE ANEURYSM REPAIR Right 04/24/2019   Procedure: REPAIR OF RIGHT FEMORAL ARTERY PSEUDOANEURYSM;  Surgeon: Rosetta Posner, MD;  Location: [place];  Service: Vascular;  Laterality: Right;  . FEMORAL-POPLITEAL BYPASS GRAFT  02/21/2011   right  . FEMORAL-TIBIAL BYPASS GRAFT Right 04/24/2019   Procedure: REDO BYPASS GRAFT RIGHT FEMORAL-TIBIAL ARTERY USING LEFT LEG VEIN & HEMASHIELD GOLD 24mm GRAFT;  Surgeon: Rosetta Posner, MD;  Location: [place];  Service: Vascular;  Laterality: Right;  . IR FLUORO GUIDE CV LINE RIGHT  12/28/2019  . IR REMOVAL TUN CV CATH W/O FL  01/11/2020  . IR US GUIDE VASC ACCESS RIGHT  12/28/2019  . OTHER SURGICAL HISTORY     POST RIGHT FEMOROPOPLITEAL BYPASS GRAFT  . PERIPHERAL VASCULAR CATHETERIZATION  11/05/2014   Procedure: LOWER EXTREMITY ANGIOGRAPHY;  Surgeon: Angelia Mould, MD;  Location: [facility] CATH LAB;  Service: Cardiovascular;;  . VEIN HARVEST Left 04/24/2019   Procedure: LEFT LEG SAPHENOUS VEIN HARVEST;  Surgeon: Rosetta Posner, MD;  Location: [place];  Service: Vascular;  Laterality: Left;     reports that he quit smoking about 15 months ago. He has Keria Widrig 40.00 pack-year smoking history. He has never  used smokeless tobacco. He reports that he does not drink alcohol or use drugs.  No Known Allergies  Family History  Problem Relation Age of Onset  . Diabetes Mother   . Hyperlipidemia Sister   . Hypertension Sister    Prior to Admission medications   Medication Sig Start Date End Date Taking? Authorizing Provider  ACCU-CHEK FASTCLIX LANCETS [place]  07/30/18  Yes [provider]  Accu-Chek FastClix Lancets MISC USE TO MONITOR BLOOD GLUCOSE 2 TIMES DAILY 11/06/19  Yes [provider]  ACCU-CHEK GUIDE test strip  11/30/18  Yes [provider]  amLODipine (NORVASC) 5 MG tablet Take 1 tablet (5 mg total) by mouth daily. 01/05/19  Yes Martinique, Peter M, MD  BD PEN NEEDLE NANO U/F 32G X 4 MM MISC  08/06/18  Yes [provider]  carvedilol (COREG) 6.25 MG tablet Take 1 tablet (6.25 mg total) by mouth 2 (two) times daily with Uchenna Rappaport meal. 01/11/20 03/11/20 Yes Kc, Ramesh, MD  glucose blood (ACCU-CHEK GUIDE) test strip one strip (1 each dose) by Other route 2 (two) times daily. Test blood glucose 2 times Nala Kachel day. 11/21/19  Yes [provider]  guaiFENesin (MUCINEX) 600 MG 12 hr tablet Take 2 tablets (1,200 mg total) by mouth 2 (two) times daily. Patient taking differently: Take 600 mg by mouth 2 (two) times daily.  11/30/18  Yes Lavina Hamman, MD  insulin glargine (LANTUS) 100 UNIT/ML injection Inject 0.06 mLs (6 Units total) into the skin at bedtime. 01/11/20  Yes Kc, Maren [place], MD  Insulin Pen Needle (BD PEN NEEDLE NANO U/F) 32G X 4 MM MISC USE TO INJECT INTO THE SKIN DAILY. 11/06/19  Yes [provider]  levETIRAcetam (KEPPRA) 500 MG tablet Take 1 tablet (500 mg total) by mouth 2 (two) times daily. 01/11/20 02/10/20 Yes Antonieta Pert, MD  Multiple Vitamin (MULTIVITAMIN WITH MINERALS) TABS tablet Take 1 tablet by mouth daily.   Yes [provider]  Nutritional Supplements (FEEDING SUPPLEMENT, NEPRO CARB STEADY,) LIQD Take 237 mLs by mouth 3 (three) times daily  between meals for 15 days. 01/11/20 01/26/20 Yes Antonieta Pert, MD  OXYGEN Inhale 4.5 L into the lungs continuous.   Yes [provider]  rosuvastatin (CRESTOR) 40 MG tablet Take 1 tablet (40 mg total) by mouth daily. 12/01/19 02/29/20 Yes Martinique, Peter M, MD  umeclidinium-vilanterol [facility] ELLIPTA) 62.5-25 MCG/INH AEPB Inhale 1 puff into the lungs daily. 08/07/19  Yes Lauraine Rinne, NP  warfarin (COUMADIN) 2 MG tablet Take 1 tablet (2 mg total) by mouth daily at 6 PM. 01/11/20 02/10/20 Yes Kc, Ramesh, MD  ANORO ELLIPTA 62.5-25 MCG/INH AEPB INHALE 1 PUFF BY MOUTH ONCE DAILY . APPOINTMENT REQUIRED FOR FUTURE REFILLS Patient not taking: No sig reported 07/31/19   Rigoberto Noel, MD  furosemide (LASIX) 20 MG tablet Take 1 tablet (20 mg total) by mouth 2 (two) times daily. 01/20/20   Pattricia Boss, MD    Physical Exam: Vitals:   01/20/20 1530 01/20/20 1600 01/20/20 1715 01/20/20 1730  BP: (!) 155/78 Marland Kitchen)  142/80 (!) 148/87 (!) 141/82  Pulse: 86 92 85 91  Resp: (!) 26 (!) 23 (!) 23 (!) 22  Temp:      TempSrc:      SpO2: 100% 99% 92% 90%    Constitutional: NAD, calm, comfortable Vitals:   01/20/20 1530 01/20/20 1600 01/20/20 1715 01/20/20 1730  BP: (!) 155/78 (!) 142/80 (!) 148/87 (!) 141/82  Pulse: 86 92 85 91  Resp: (!) 26 (!) 23 (!) 23 (!) 22  Temp:      TempSrc:      SpO2: 100% 99% 92% 90%   Eyes: PERRL, lids and conjunctivae normal ENMT: Mucous membranes are moist. Posterior pharynx clear of any exudate or lesions.Normal dentition.  Neck: normal, supple, no masses, no thyromegaly Respiratory: bibasilar crackles, on 6 L [place] Cardiovascular: Regular rate and rhythm, no murmurs / rubs / gallops. No extremity edema. Abdomen: no tenderness, no masses palpated. No hepatosplenomegaly. Bowel sounds positive.  Musculoskeletal: no clubbing / cyanosis. No joint deformity upper and lower extremities. Good ROM, no contractures. Normal muscle tone.  Skin: no rashes, lesions, ulcers. No  induration Neurologic: CN 2-12 grossly intact. Sensation intact. Moving all extremities Psychiatric: Normal judgment and insight. Alert and oriented x 3. Normal mood.   Labs on Admission: I have personally reviewed following labs and imaging studies  CBC: Recent Labs  Lab 01/20/20 1439 01/20/20 1527  WBC 11.1*  --   HGB 9.3* 8.8*  HCT 31.0* 26.0*  MCV 96.0  --   PLT 351  --    Basic Metabolic Panel: Recent Labs  Lab 01/20/20 1439 01/20/20 1527  NA 144 143  K 4.2 4.1  CL 110  --   CO2 23  --   GLUCOSE 250*  --   BUN 25*  --   CREATININE 2.59*  --   CALCIUM 9.0  --    GFR: Estimated Creatinine Clearance: 29.5 mL/min (Kyle [name]) (by C-G formula based on SCr of 2.59 mg/dL (H)). Liver Function Tests: Recent Labs  Lab 01/20/20 1439  AST 19  ALT 20  ALKPHOS 84  BILITOT 0.8  PROT 6.8  ALBUMIN 2.6*   No results for input(s): LIPASE, AMYLASE in the last 168 hours. No results for input(s): AMMONIA in the last 168 hours. Coagulation Profile: Recent Labs  Lab 01/20/20 1439  INR 1.7*   Cardiac Enzymes: No results for input(s): CKTOTAL, CKMB, CKMBINDEX, TROPONINI in the last 168 hours. BNP (last 3 results) No results for input(s): PROBNP in the last 8760 hours. HbA1C: No results for input(s): HGBA1C in the last 72 hours. CBG: No results for input(s): GLUCAP in the last 168 hours. Lipid Profile: No results for input(s): CHOL, HDL, LDLCALC, TRIG, CHOLHDL, LDLDIRECT in the last 72 hours. Thyroid Function Tests: No results for input(s): TSH, T4TOTAL, FREET4, T3FREE, THYROIDAB in the last 72 hours. Anemia Panel: No results for input(s): VITAMINB12, FOLATE, FERRITIN, TIBC, IRON, RETICCTPCT in the last 72 hours. Urine analysis:    Component Value Date/Time   COLORURINE YELLOW 12/24/2019 1632   APPEARANCEUR CLEAR 12/24/2019 1632   LABSPEC 1.014 12/24/2019 1632   PHURINE 5.0 12/24/2019 1632   GLUCOSEU 50 (Kyle [name]) 12/24/2019 1632   HGBUR MODERATE (Kyle [name]) 12/24/2019 1632   BILIRUBINUR  NEGATIVE 12/24/2019 1632   KETONESUR 20 (Kyle [name]) 12/24/2019 1632   PROTEINUR 100 (Kyle [name]) 12/24/2019 1632   UROBILINOGEN 0.2 12/19/2014 0821   NITRITE NEGATIVE 12/24/2019 1632   LEUKOCYTESUR NEGATIVE 12/24/2019 1632    Radiological Exams on Admission: [facility] Chest [facility] [place]  1 View  Result Date: 01/20/2020 CLINICAL DATA:  Shortness of breath since Thursday EXAM: PORTABLE CHEST 1 VIEW COMPARISON:  January 07, 2020 FINDINGS: The mediastinal contour is normal. The heart size is enlarged. Mild increased pulmonary interstitium is identified bilaterally. Lungs are hyperinflated. The bony structures are stable. IMPRESSION: Mild congestive heart failure. Electronically Signed   By: Abelardo Diesel M.D.   On: 01/20/2020 14:41    EKG: Independently reviewed. Sinus tachy  Assessment/Plan Active Problems:   Heart failure (HCC)  Acute on Chronic Hypoxic Respiratory Failure  Heart Failure with Preserved Ejection Fracture Exacerbation  Nonischemic Cardiomyopathy: Echo from 2/17 with EF 65-70%, severe concentric LVH, diastolic dysfunction (EF 36-62% in 2012) CXR with mild CHF His lasix was held on discharge at last hospitalization He's feeling better after lasix 40 mg x1 Continue lasix 40 mg BID Continue coreg, quinapril on hold in setting of elevated creatinine Strict I/O, daily weights  Wt Readings from Last 3 Encounters:  01/10/20 76.5 kg  11/30/19 71.7 kg  10/13/19 68.5 kg   COPD on 5 L Chronically:  Continue anoro Continue prn nebs No current indication for steroids  AKI on CKD IIIa: had aki in setting of volume depletion due to vomiting, diarrhea, as well as ace inhibitor at last hospital admission. Baseline creatinine 1.57 (2/21) -> last admission peaked at 14.97, required dialysis -> creatinine now improving 2.59 Continue to hold quinapril Follow with diuresis   Atrial Fibrillation: continue warfarin -> if creatinine continues to improve, consider transition back to eliquis Subtherapeutic INR,  phramacy to dose warfarin  Hypertension: coreg, lasix - ace on hold  PAD: s/p redo right fem pop bypass.  Needs f/u with vascular.  Type 2 Diabetes: A1c 8.0.  Continue lantus, add SSI.  Follow  HLD: continue rosuvastatin, dose adjust for renal function  Normocytic Anemia: follow iron, b12, folate, ferritin, continue to trend  Concern for seizures: episode during last hospitalization where he became nonverbal and flaccid in bilateral upper extremities.  No seizures on spot EEG and LTM.  Concern for possible seizure at that time, recommended keppra 500 mg BID and seizure precautions. Needs outpatient neurology follow up (see neuro note from 12/28/19)  Screening COVID 19 testing pending  DVT prophylaxis: warfarin  Code Status: full Family Communication: Wife at bedside in ED  Disposition Plan: pending improved resp status after IV diuresis Consults called: cardiology by ED  Admission status: obs    Fayrene Helper MD [facility] Pager AMION  If 7PM-7AM, please contact night-coverage [email or web address] Use universal [place] password for that web site. If you do not have the password, please call the hospital operator.  01/20/2020, 5:58 PM

## 2020-01-20 NOTE — Discharge Instructions (Signed)
Please take lasix as prescribed.  Return if you are worse at any time.  Keep appointment with your doctor next week to have kidney function rechecked.

## 2020-01-20 NOTE — Progress Notes (Signed)
ANTICOAGULATION CONSULT NOTE - Initial Consult  Pharmacy Consult for Warfarin Indication: atrial fibrillation  No Known Allergies  Patient Measurements:   Heparin Dosing Weight:   Vital Signs: Temp: 98.7 F (37.1 C) (03/27 1320) Temp Source: Oral (03/27 1320) BP: 152/75 (03/27 2008) Pulse Rate: 96 (03/27 1800)  Labs: Recent Labs    01/20/20 1439 01/20/20 1527  HGB 9.3* 8.8*  HCT 31.0* 26.0*  PLT 351  --   LABPROT 19.9*  --   INR 1.7*  --   CREATININE 2.59*  --     Estimated Creatinine Clearance: 29.5 mL/min (A) (by C-G formula based on SCr of 2.59 mg/dL (H)).   Medical History: Past Medical History:  Diagnosis Date  . Aortic insufficiency    MODERATE WITH A BICUSPID AORTIC VAVLE  . Arterial occlusive disease    MULTILEVEL  . CHF (congestive heart failure) ([place])   . COPD (chronic obstructive pulmonary disease) ([place])   . Dilated cardiomyopathy (HCC)    WITH EJECTION FRACTION DOWN TO 20-25%--WITH CONGESTIVE HEART FAILURE  . Edema    LOWER EXTREMETIES  . Hypertension   . Normal coronary arteries 2009  . Orthopnea   . Peripheral arterial disease ([place])   . Pulmonary hypertension ([place])   . SOB (shortness of breath)     Medications:  Scheduled:  . amLODipine  5 mg Oral Daily  . carvedilol  6.25 mg Oral BID WC  . [START ON 01/21/2020] furosemide  40 mg Intravenous BID  . insulin aspart  0-9 Units Subcutaneous Q4H  . insulin glargine  6 Units Subcutaneous QHS  . levETIRAcetam  500 mg Oral BID  . [START ON 01/21/2020] rosuvastatin  10 mg Oral q1800  . umeclidinium-vilanterol  1 puff Inhalation Daily  . warfarin  3 mg Oral ONCE-1600  . [START ON 01/21/2020] Warfarin - Pharmacist Dosing Inpatient   Does not apply q1800    Assessment: Patient is a 7 yom that is being admitted for SOB. The patient has a Hx of Afib and is on Warfarin for this. Patient was on Apixaban prior to a recent AKI. Patient appears to be recovering well and if renal function remains improve  after admission and diureses may consider switching back to Apixaban.  PTA regimen ( 2mg  daily)   Goal of Therapy:  INR 2-3 Monitor platelets by anticoagulation protocol: Yes   Plan:  - INR currently subtherapeutic at 1.7  - Will give Warfarin 3 mg PO x 1 dose tonight  - Monitor patient's PT-INR daily and cbc while on heparin    Duanne Limerick PharmD. BCPS  01/20/2020,8:50 PM

## 2020-01-21 ENCOUNTER — Observation Stay (HOSPITAL_COMMUNITY): Payer: Medicare HMO

## 2020-01-21 DIAGNOSIS — J9621 Acute and chronic respiratory failure with hypoxia: Secondary | ICD-10-CM | POA: Diagnosis present

## 2020-01-21 DIAGNOSIS — Z20822 Contact with and (suspected) exposure to covid-19: Secondary | ICD-10-CM | POA: Diagnosis present

## 2020-01-21 DIAGNOSIS — R0602 Shortness of breath: Secondary | ICD-10-CM | POA: Diagnosis present

## 2020-01-21 DIAGNOSIS — Z87891 Personal history of nicotine dependence: Secondary | ICD-10-CM | POA: Diagnosis not present

## 2020-01-21 DIAGNOSIS — I5033 Acute on chronic diastolic (congestive) heart failure: Secondary | ICD-10-CM | POA: Diagnosis present

## 2020-01-21 DIAGNOSIS — J449 Chronic obstructive pulmonary disease, unspecified: Secondary | ICD-10-CM | POA: Diagnosis present

## 2020-01-21 DIAGNOSIS — N1831 Chronic kidney disease, stage 3a: Secondary | ICD-10-CM | POA: Diagnosis present

## 2020-01-21 DIAGNOSIS — E1122 Type 2 diabetes mellitus with diabetic chronic kidney disease: Secondary | ICD-10-CM | POA: Diagnosis present

## 2020-01-21 DIAGNOSIS — N179 Acute kidney failure, unspecified: Secondary | ICD-10-CM | POA: Diagnosis present

## 2020-01-21 DIAGNOSIS — Z794 Long term (current) use of insulin: Secondary | ICD-10-CM | POA: Diagnosis not present

## 2020-01-21 DIAGNOSIS — R0902 Hypoxemia: Secondary | ICD-10-CM | POA: Diagnosis not present

## 2020-01-21 DIAGNOSIS — I272 Pulmonary hypertension, unspecified: Secondary | ICD-10-CM | POA: Diagnosis present

## 2020-01-21 DIAGNOSIS — E1151 Type 2 diabetes mellitus with diabetic peripheral angiopathy without gangrene: Secondary | ICD-10-CM | POA: Diagnosis present

## 2020-01-21 DIAGNOSIS — Z9981 Dependence on supplemental oxygen: Secondary | ICD-10-CM | POA: Diagnosis not present

## 2020-01-21 DIAGNOSIS — Z7901 Long term (current) use of anticoagulants: Secondary | ICD-10-CM | POA: Diagnosis not present

## 2020-01-21 DIAGNOSIS — Z833 Family history of diabetes mellitus: Secondary | ICD-10-CM | POA: Diagnosis not present

## 2020-01-21 DIAGNOSIS — Z79899 Other long term (current) drug therapy: Secondary | ICD-10-CM | POA: Diagnosis not present

## 2020-01-21 DIAGNOSIS — I420 Dilated cardiomyopathy: Secondary | ICD-10-CM | POA: Diagnosis present

## 2020-01-21 DIAGNOSIS — D509 Iron deficiency anemia, unspecified: Secondary | ICD-10-CM | POA: Diagnosis present

## 2020-01-21 DIAGNOSIS — E785 Hyperlipidemia, unspecified: Secondary | ICD-10-CM | POA: Diagnosis present

## 2020-01-21 DIAGNOSIS — E869 Volume depletion, unspecified: Secondary | ICD-10-CM | POA: Diagnosis present

## 2020-01-21 DIAGNOSIS — I130 Hypertensive heart and chronic kidney disease with heart failure and stage 1 through stage 4 chronic kidney disease, or unspecified chronic kidney disease: Secondary | ICD-10-CM | POA: Diagnosis present

## 2020-01-21 DIAGNOSIS — I4891 Unspecified atrial fibrillation: Secondary | ICD-10-CM | POA: Diagnosis present

## 2020-01-21 DIAGNOSIS — I5031 Acute diastolic (congestive) heart failure: Secondary | ICD-10-CM | POA: Diagnosis not present

## 2020-01-21 LAB — COMPREHENSIVE METABOLIC PANEL
ALT: 21 U/L (ref 0–44)
AST: 19 U/L (ref 15–41)
Albumin: 2.3 g/dL — ABNORMAL LOW (ref 3.5–5.0)
Alkaline Phosphatase: 72 U/L (ref 38–126)
Anion gap: 10 (ref 5–15)
BUN: 24 mg/dL — ABNORMAL HIGH (ref 8–23)
CO2: 27 mmol/L (ref 22–32)
Calcium: 9 mg/dL (ref 8.9–10.3)
Chloride: 107 mmol/L (ref 98–111)
Creatinine, Ser: 2.45 mg/dL — ABNORMAL HIGH (ref 0.61–1.24)
GFR calc Af Amer: 30 mL/min — ABNORMAL LOW (ref >60)
GFR calc non Af Amer: 26 mL/min — ABNORMAL LOW (ref >60)
Glucose, Bld: 155 mg/dL — ABNORMAL HIGH (ref 70–99)
Potassium: 4.2 mmol/L (ref 3.5–5.1)
Sodium: 144 mmol/L (ref 135–145)
Total Bilirubin: 0.9 mg/dL (ref 0.3–1.2)
Total Protein: 6.4 g/dL — ABNORMAL LOW (ref 6.5–8.1)

## 2020-01-21 LAB — GLUCOSE, CAPILLARY
Glucose-Capillary: 113 mg/dL — ABNORMAL HIGH (ref 70–99)
Glucose-Capillary: 138 mg/dL — ABNORMAL HIGH (ref 70–99)
Glucose-Capillary: 141 mg/dL — ABNORMAL HIGH (ref 70–99)
Glucose-Capillary: 163 mg/dL — ABNORMAL HIGH (ref 70–99)
Glucose-Capillary: 189 mg/dL — ABNORMAL HIGH (ref 70–99)
Glucose-Capillary: 76 mg/dL (ref 70–99)

## 2020-01-21 LAB — PROTIME-INR
INR: 1.7 — ABNORMAL HIGH (ref 0.8–1.2)
Prothrombin Time: 20.1 seconds — ABNORMAL HIGH (ref 11.4–15.2)

## 2020-01-21 LAB — CBC
HCT: 29 % — ABNORMAL LOW (ref 39.0–52.0)
Hemoglobin: 8.9 g/dL — ABNORMAL LOW (ref 13.0–17.0)
MCH: 28.3 pg (ref 26.0–34.0)
MCHC: 30.7 g/dL (ref 30.0–36.0)
MCV: 92.4 fL (ref 80.0–100.0)
Platelets: 344 10*3/uL (ref 150–400)
RBC: 3.14 MIL/uL — ABNORMAL LOW (ref 4.22–5.81)
RDW: 18 % — ABNORMAL HIGH (ref 11.5–15.5)
WBC: 10.1 10*3/uL (ref 4.0–10.5)
nRBC: 0.3 % — ABNORMAL HIGH (ref 0.0–0.2)

## 2020-01-21 MED ORDER — WARFARIN SODIUM 2 MG PO TABS
4.0000 mg | ORAL_TABLET | Freq: Once | ORAL | Status: AC
  Administered 2020-01-21: 4 mg via ORAL
  Filled 2020-01-21: qty 2

## 2020-01-21 MED ORDER — FUROSEMIDE 10 MG/ML IJ SOLN
60.0000 mg | Freq: Two times a day (BID) | INTRAMUSCULAR | Status: DC
  Administered 2020-01-21 – 2020-01-22 (×2): 60 mg via INTRAVENOUS
  Filled 2020-01-21 (×2): qty 6

## 2020-01-21 MED ORDER — FERROUS SULFATE 325 (65 FE) MG PO TABS
325.0000 mg | ORAL_TABLET | Freq: Every day | ORAL | Status: DC
  Administered 2020-01-22 – 2020-01-24 (×3): 325 mg via ORAL
  Filled 2020-01-21 (×3): qty 1

## 2020-01-21 NOTE — Progress Notes (Signed)
PROGRESS NOTE    Kyle [name]  ZOX:[id number] DOB: [birth year] DOA: 01/20/2020 PCP: Alonna Buckler, MD   Brief Narrative:  Kyle [name] is a 69 y.o. 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  He notes that after discharge, he was initially feeling well.  A few days after, became SOB.  This has been progressive.  He denies CP, fevers, chills, change in taste or smell, sick contacts, covid contacts, N/V/D.  Denies smoking or drinking.  His medications were changed at discharge (his eliquis was transitioned to warfarin while his renal function was decreased - in addition, his ACE and diuretic were being held - metformin was discontinued).     Assessment & Plan:   Active Problems:   Heart failure ([place])  Acute on Chronic Hypoxic Respiratory Failure  Heart Failure with Preserved Ejection Fracture Exacerbation  Nonischemic Cardiomyopathy: Echo from 2/17 with EF 65-70%, severe concentric LVH, diastolic dysfunction (EF 81-19% in 2012) CXR with mild CHF Repeat CXR 3/28 similar His lasix was held on discharge at last hospitalization Will increase lasix dose and follow Continue coreg, quinapril on hold in setting of elevated creatinine Strict I/O, daily weights  Wt Readings from Last 3 Encounters:  01/21/20 67.2 kg  01/10/20 76.5 kg  11/30/19 71.7 kg   COPD on 5 L Chronically:  Continue anoro Continue prn nebs No current indication for steroids  AKI on CKD IIIa: had aki in setting of volume depletion due to vomiting, diarrhea, as well as ace inhibitor at last hospital admission. Baseline creatinine 1.57 (2/21) -> last admission peaked at 14.97, required dialysis -> creatinine now improving 2.45 Continue  to hold quinapril Follow with diuresis   Atrial Fibrillation: continue warfarin -> if creatinine continues to improve, consider transition back to eliquis Subtherapeutic INR, phramacy to dose warfarin  Hypertension: coreg, lasix - ace on hold  PAD: s/p redo right fem pop bypass.  Needs f/u with vascular.  Type 2 Diabetes: A1c 8.0.  Stop lantus with AM BG < 100.  Follow with SSI.  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   Start iron  Concern for seizures: episode during last hospitalization where he became nonverbal and flaccid in bilateral upper extremities.  No seizures on spot EEG and LTM.  Concern for possible seizure at that time, recommended keppra 500 mg BID and seizure precautions. Needs outpatient neurology follow up (see neuro note from 12/28/19)  DVT prophylaxis: warfarin Code Status: full Family Communication: wife at bedside Disposition Plan:  . Patient came from: home            . Anticipated d/c place: . Barriers to d/c OR conditions which need to be met to effect a safe d/c: persistent pulm edema, needs additional IV diuresis, transition to inpatient status  Consultants:   none  Procedures:   none  Antimicrobials:  Anti-infectives (From admission, onward)   None     Subjective: Feels better Asking if he'll ever be back to normal, off oxygen, etc  Objective: Vitals:   01/21/20 0029 01/21/20 0410 01/21/20 0738 01/21/20 0744  BP: 134/75 128/72  (!) 146/71  Pulse: 87 79  82  Resp: Marland Kitchen)  _0 Temp: 98.7 F (37.1 C) 98.1 F (36.7 C)  98 F (36.7 C)  TempSrc: Oral Oral  Oral  SpO2: 91% 92% 92% (!) 88%  Weight: 67.2 kg     Height:        Intake/Output Summary (Last 24 hours) at 01/21/2020 1247 Last data filed at 01/21/2020 1025 Gross per 24 hour  Intake 840 ml  Output 1450 ml  Net -610 ml   Filed Weights   01/20/20 2100 01/21/20 0029    Weight: 67.9 kg 67.2 kg    Examination:  General exam: Appears calm and comfortable  Respiratory system: bibasilar crackles Cardiovascular system: S1 & S2 heard, RRR.  Gastrointestinal system: Abdomen is nondistended, soft and nontender.  Central nervous system: Alert and oriented. No focal neurological deficits. Extremities: no LEE Skin: No rashes, lesions or ulcers Psychiatry: Judgement and insight appear normal. Mood & affect appropriate.     Data Reviewed: I have personally reviewed following labs and imaging studies  CBC: Recent Labs  Lab 01/20/20 1439 01/20/20 1527 01/21/20 0737  WBC 11.1*  --  10.1  HGB 9.3* 8.8* 8.9*  HCT 31.0* 26.0* 29.0*  MCV 96.0  --  92.4  PLT 351  --  852   Basic Metabolic Panel: Recent Labs  Lab 01/20/20 1439 01/20/20 1527 01/21/20 0914  NA 144 143 144  K 4.2 4.1 4.2  CL 110  --  107  CO2 23  --  27  GLUCOSE 250*  --  155*  BUN 25*  --  24*  CREATININE 2.59*  --  2.45*  CALCIUM 9.0  --  9.0   GFR: Estimated Creatinine Clearance: 27.8 mL/min (A) (by C-G formula based on SCr of 2.45 mg/dL (H)). Liver Function Tests: Recent Labs  Lab 01/20/20 1439 01/21/20 0914  AST 19 19  ALT 20 21  ALKPHOS 84 72  BILITOT 0.8 0.9  PROT 6.8 6.4*  ALBUMIN 2.6* 2.3*   No results for input(s): LIPASE, AMYLASE in the last 168 hours. No results for input(s): AMMONIA in the last 168 hours. Coagulation Profile: Recent Labs  Lab 01/20/20 1439 01/21/20 0737  INR 1.7* 1.7*   Cardiac Enzymes: No results for input(s): CKTOTAL, CKMB, CKMBINDEX, TROPONINI in the last 168 hours. BNP (last 3 results) No results for input(s): PROBNP in the last 8760 hours. HbA1C: No results for input(s): HGBA1C in the last 72 hours. CBG: Recent Labs  Lab 01/20/20 2003 01/21/20 0036 01/21/20 0408 01/21/20 0747 01/21/20 1209  GLUCAP 156* 141* 163* 76 113*   Lipid Profile: No results for input(s): CHOL, HDL, LDLCALC, TRIG, CHOLHDL, LDLDIRECT in the last 72  hours. Thyroid Function Tests: No results for input(s): TSH, T4TOTAL, FREET4, T3FREE, THYROIDAB in the last 72 hours. Anemia Panel: Recent Labs    01/20/20 1439  VITAMINB12 429  FOLATE 38.6  FERRITIN 437*  TIBC 193*  IRON 24*   Sepsis Labs: No results for input(s): PROCALCITON, LATICACIDVEN in the last 168 hours.  Recent Results (from the past 240 hour(s))  SARS CORONAVIRUS 2 (TAT 6-24 HRS) Nasopharyngeal Nasopharyngeal Swab     Status: None   Collection Time: 01/20/20  2:10 PM   Specimen: Nasopharyngeal Swab  Result Value Ref Range Status   SARS Coronavirus 2 NEGATIVE NEGATIVE Final    Comment: (NOTE) SARS-CoV-2 target nucleic acids are NOT DETECTED. The SARS-CoV-2 RNA is generally detectable in upper and lower respiratory specimens during the acute phase of infection. Negative results do not preclude  SARS-CoV-2 infection, do not rule out co-infections with other pathogens, and should not be used as the sole basis for treatment or other patient management decisions. Negative results must be combined with clinical observations, patient history, and epidemiological information. The expected result is Negative. Fact Sheet for Patients: SugarRoll.be Fact Sheet for Healthcare Providers: https://www.woods-mathews.com/ This test is not yet approved or cleared by the [place] FDA and  has been authorized for detection and/or diagnosis of SARS-CoV-2 by FDA under an Emergency Use Authorization (EUA). This EUA will remain  in effect (meaning this test can be used) for the duration of the COVID-19 declaration under Section 56 4(b)(1) of the Act, 21 U.S.C. section 360bbb-3(b)(1), unless the authorization is terminated or revoked sooner. Performed at [facility], [place] [address], [place], [place] [address]          Radiology Studies: Portable chest 1 View  Result Date: 01/21/2020 CLINICAL DATA:  COPD, ischemic cardiomyopathy  EXAM: PORTABLE CHEST 1 VIEW COMPARISON:  01/20/2020 FINDINGS: Bilateral diffuse interstitial thickening. No pleural effusion or pneumothorax. Stable cardiomediastinal silhouette. No acute osseous abnormality. IMPRESSION: Mild CHF. Electronically Signed   By: Kathreen Devoid   On: 01/21/2020 09:33   DG Chest Port 1 View  Result Date: 01/20/2020 CLINICAL DATA:  Shortness of breath since Thursday EXAM: PORTABLE CHEST 1 VIEW COMPARISON:  January 07, 2020 FINDINGS: The mediastinal contour is normal. The heart size is enlarged. Mild increased pulmonary interstitium is identified bilaterally. Lungs are hyperinflated. The bony structures are stable. IMPRESSION: Mild congestive heart failure. Electronically Signed   By: Abelardo Diesel M.D.   On: 01/20/2020 14:41        Scheduled Meds: . amLODipine  5 mg Oral Daily  . carvedilol  6.25 mg Oral BID WC  . furosemide  40 mg Intravenous BID  . insulin aspart  0-9 Units Subcutaneous Q4H  . insulin glargine  6 Units Subcutaneous QHS  . levETIRAcetam  500 mg Oral BID  . rosuvastatin  10 mg Oral q1800  . umeclidinium-vilanterol  1 puff Inhalation Daily  . Warfarin - Pharmacist Dosing Inpatient   Does not apply q1800   Continuous Infusions:   LOS: 0 days    Time spent: over 30 min    Fayrene Helper, MD [facility]   To contact the attending provider between 7A-7P or the covering provider during after hours 7P-7A, please log into the web site [email or web address] and access using universal [place] password for that web site. If you do not have the password, please call the hospital operator.  01/21/2020, 12:47 PM

## 2020-01-21 NOTE — Progress Notes (Signed)
Patient has been on 6L HFNC, O2 sats 98%. Will continue to monitor and wean O2 down.

## 2020-01-21 NOTE — Progress Notes (Signed)
Patient arrived to unit, was on 3L O2 via nasal cannula. O2 sats were 80's, RN changed to HFNC and patient is now on 9L. Will decrease back down to 6L as patient was on 6L in ED.  Patient is not breathing with accessory muscle, RR about 22-24 with exertion and about 20 with rest. Will continue to monitor.

## 2020-01-21 NOTE — Plan of Care (Signed)
  Problem: Education: Goal: Knowledge of General Education information will improve Description Including pain rating scale, medication(s)/side effects and non-pharmacologic comfort measures Outcome: Progressing   Problem: Health Behavior/Discharge Planning: Goal: Ability to manage health-related needs will improve Outcome: Progressing   

## 2020-01-21 NOTE — Progress Notes (Signed)
Patient resting comfortably during shift report. Denies complaints.  

## 2020-01-21 NOTE — Progress Notes (Signed)
ANTICOAGULATION CONSULT NOTE - Initial Consult  Pharmacy Consult for Warfarin Indication: atrial fibrillation  No Known Allergies  Patient Measurements: Height: 5\' 11"  (180.3 cm) Weight: 148 lb 3.2 oz (67.2 kg) IBW/kg (Calculated) : 75.3  Vital Signs: Temp: 98 F (36.7 C) (03/28 1346) Temp Source: Oral (03/28 1346) BP: 126/67 (03/28 1346) Pulse Rate: 82 (03/28 0744)  Labs: Recent Labs    01/20/20 1439 01/20/20 1439 01/20/20 1527 01/21/20 0737 01/21/20 0914  HGB 9.3*   < > 8.8* 8.9*  --   HCT 31.0*  --  26.0* 29.0*  --   PLT 351  --   --  344  --   LABPROT 19.9*  --   --  20.1*  --   INR 1.7*  --   --  1.7*  --   CREATININE 2.59*  --   --   --  2.45*   < > = values in this interval not displayed.    Estimated Creatinine Clearance: 27.8 mL/min (A) (by C-G formula based on SCr of 2.45 mg/dL (H)).   Medical History: Past Medical History:  Diagnosis Date  . Aortic insufficiency    MODERATE WITH A BICUSPID AORTIC VAVLE  . Arterial occlusive disease    MULTILEVEL  . CHF (congestive heart failure) ([place])   . COPD (chronic obstructive pulmonary disease) ([place])   . Dilated cardiomyopathy ([facility])    WITH EJECTION FRACTION DOWN TO 20-25%--WITH CONGESTIVE HEART FAILURE  . Edema    LOWER EXTREMETIES  . Hypertension   . Normal coronary arteries 2009  . Orthopnea   . Peripheral arterial disease ([place])   . Pulmonary hypertension ([place])   . SOB (shortness of breath)     Medications:  Scheduled:  . amLODipine  5 mg Oral Daily  . carvedilol  6.25 mg Oral BID WC  . [START ON 01/22/2020] ferrous sulfate  325 mg Oral Q breakfast  . furosemide  60 mg Intravenous BID  . insulin aspart  0-9 Units Subcutaneous Q4H  . levETIRAcetam  500 mg Oral BID  . rosuvastatin  10 mg Oral q1800  . umeclidinium-vilanterol  1 puff Inhalation Daily  . Warfarin - Pharmacist Dosing Inpatient   Does not apply q1800    Assessment: Patient is a 65 yom that is being admitted for SOB. The patient has  a Hx of Afib and is on warfarin for this. Patient was on apixaban prior to a recent AKI. If renal function remains improve after admission and diureses may consider switching back to apixaban. PTA warfarin regimen 2mg  daily.  INR today remains subtherapeutic at 1.7. H&H is stable at 8.9/29.0, plts wnl at 344.  Goal of Therapy:  INR 2-3 Monitor platelets by anticoagulation protocol: Yes   Plan:  - warfarin 4 mg PO x 1 dose tonight  - Monitor daily INR and CBC  - Monitor for bleeding   Thank you,   Eddie Candle, PharmD PGY-1 Pharmacy Resident   Please check amion for clinical pharmacist contact number 01/21/2020,1:52 PM

## 2020-01-21 NOTE — Plan of Care (Signed)
  Problem: Clinical Measurements: Goal: Respiratory complications will improve Outcome: Progressing   Problem: Activity: Goal: Risk for activity intolerance will decrease Outcome: Progressing   

## 2020-01-21 NOTE — Progress Notes (Signed)
Message to MD.     CCMD is reporting patient with "short burst of SVT" and earlier "5 beats of tach" both events coincided with patient was using the urinal, completely asymptomatic.

## 2020-01-22 ENCOUNTER — Inpatient Hospital Stay (HOSPITAL_COMMUNITY): Payer: Medicare HMO

## 2020-01-22 LAB — IRON AND TIBC
Iron: 28 ug/dL — ABNORMAL LOW (ref 45–182)
Saturation Ratios: 14 % — ABNORMAL LOW (ref 17.9–39.5)
TIBC: 204 ug/dL — ABNORMAL LOW (ref 250–450)
UIBC: 176 ug/dL

## 2020-01-22 LAB — COMPREHENSIVE METABOLIC PANEL
ALT: 20 U/L (ref 0–44)
AST: 18 U/L (ref 15–41)
Albumin: 2.5 g/dL — ABNORMAL LOW (ref 3.5–5.0)
Alkaline Phosphatase: 71 U/L (ref 38–126)
Anion gap: 12 (ref 5–15)
BUN: 30 mg/dL — ABNORMAL HIGH (ref 8–23)
CO2: 27 mmol/L (ref 22–32)
Calcium: 9.1 mg/dL (ref 8.9–10.3)
Chloride: 101 mmol/L (ref 98–111)
Creatinine, Ser: 2.72 mg/dL — ABNORMAL HIGH (ref 0.61–1.24)
GFR calc Af Amer: 27 mL/min — ABNORMAL LOW (ref >60)
GFR calc non Af Amer: 23 mL/min — ABNORMAL LOW (ref >60)
Glucose, Bld: 164 mg/dL — ABNORMAL HIGH (ref 70–99)
Potassium: 4 mmol/L (ref 3.5–5.1)
Sodium: 140 mmol/L (ref 135–145)
Total Bilirubin: 0.9 mg/dL (ref 0.3–1.2)
Total Protein: 6.8 g/dL (ref 6.5–8.1)

## 2020-01-22 LAB — CBC WITH DIFFERENTIAL/PLATELET
Abs Immature Granulocytes: 0.07 10*3/uL (ref 0.00–0.07)
Basophils Absolute: 0 10*3/uL (ref 0.0–0.1)
Basophils Relative: 1 %
Eosinophils Absolute: 0.2 10*3/uL (ref 0.0–0.5)
Eosinophils Relative: 2 %
HCT: 30.3 % — ABNORMAL LOW (ref 39.0–52.0)
Hemoglobin: 9.4 g/dL — ABNORMAL LOW (ref 13.0–17.0)
Immature Granulocytes: 1 %
Lymphocytes Relative: 18 %
Lymphs Abs: 1.5 10*3/uL (ref 0.7–4.0)
MCH: 28.3 pg (ref 26.0–34.0)
MCHC: 31 g/dL (ref 30.0–36.0)
MCV: 91.3 fL (ref 80.0–100.0)
Monocytes Absolute: 0.9 10*3/uL (ref 0.1–1.0)
Monocytes Relative: 11 %
Neutro Abs: 5.5 10*3/uL (ref 1.7–7.7)
Neutrophils Relative %: 67 %
Platelets: 370 10*3/uL (ref 150–400)
RBC: 3.32 MIL/uL — ABNORMAL LOW (ref 4.22–5.81)
RDW: 17.6 % — ABNORMAL HIGH (ref 11.5–15.5)
WBC: 8.1 10*3/uL (ref 4.0–10.5)
nRBC: 0.4 % — ABNORMAL HIGH (ref 0.0–0.2)

## 2020-01-22 LAB — GLUCOSE, CAPILLARY
Glucose-Capillary: 107 mg/dL — ABNORMAL HIGH (ref 70–99)
Glucose-Capillary: 120 mg/dL — ABNORMAL HIGH (ref 70–99)
Glucose-Capillary: 133 mg/dL — ABNORMAL HIGH (ref 70–99)
Glucose-Capillary: 150 mg/dL — ABNORMAL HIGH (ref 70–99)
Glucose-Capillary: 192 mg/dL — ABNORMAL HIGH (ref 70–99)
Glucose-Capillary: 76 mg/dL (ref 70–99)

## 2020-01-22 LAB — PROTIME-INR
INR: 1.7 — ABNORMAL HIGH (ref 0.8–1.2)
Prothrombin Time: 19.7 seconds — ABNORMAL HIGH (ref 11.4–15.2)

## 2020-01-22 MED ORDER — WARFARIN SODIUM 2 MG PO TABS
4.0000 mg | ORAL_TABLET | Freq: Once | ORAL | Status: AC
  Administered 2020-01-22: 4 mg via ORAL
  Filled 2020-01-22: qty 2

## 2020-01-22 MED ORDER — FUROSEMIDE 10 MG/ML IJ SOLN
60.0000 mg | Freq: Once | INTRAMUSCULAR | Status: AC
  Administered 2020-01-22: 60 mg via INTRAVENOUS
  Filled 2020-01-22: qty 6

## 2020-01-22 MED ORDER — FUROSEMIDE 10 MG/ML IJ SOLN
60.0000 mg | Freq: Two times a day (BID) | INTRAMUSCULAR | Status: DC
  Administered 2020-01-23: 60 mg via INTRAVENOUS
  Filled 2020-01-22: qty 6

## 2020-01-22 NOTE — Telephone Encounter (Signed)
ok 

## 2020-01-22 NOTE — Consult Note (Signed)
[facility] Renal Consultation Note  Requesting MD:  Indication for Consultation: Acute on chronic kidney disease, maintenance of euvolemia, assessment and treatment of electrolyte abnormalities, assessment and treatment of acid-base abnormalities  HPI:  Kyle [name] is a 69 y.o. male.  Medical history of nonischemic cardiomyopathy COPD on 5 L of oxygen peripheral artery disease hypertension diabetes mellitus type 2.  Patient was admitted with increasing shortness of breath.  Chest x-ray was consistent with mild congestive heart failure.  His echocardiogram from 12/13/2019 showed an ejection fraction of 65-70% with concentric LVH and diastolic dysfunction.  He was recently seen by nephrology 12/24/2019.  This was for acute kidney injury in the setting of ACE inhibitor use.  Baseline serum creatinine appears but 1.5.  He required dialysis as needed during hospitalization.  His last dialysis treatment appeared to be 01/11/2020.  Outpatient medications amlodipine 5 mg daily, carvedilol 6.25 mg twice daily, insulin Lantus, insulin sliding scale, Keppra 500 mg twice daily, Coumadin 2 mg daily, Lasix 20 mg twice daily  Blood pressure 123/72 pulse 86 temperature 97.8 O2 sats 94% O2 flow 8  Sodium 140 potassium 4.0 chloride 101 CO2 27 BUN 30 creatinine 2.72 albumin 2.5 AST 18 ALT 20 hemoglobin 9.4.  Urine output 1.6 L 01/21/2020.  1.9 L 01/22/2020  Current medications amlodipine 5 mg daily Coreg 6.25 mg twice daily Lasix 60 mg IV x1 dose 01/22/2020 Crestor 10 mg daily warfarin 4 mg per pharmacy Keppra 500 mg twice daily.    Creat  Date/Time Value Ref Range Status  11/12/2014 01:44 PM 0.99 0.50 - 1.35 mg/dL Final  11/09/2014 01:33 PM 0.92 0.50 - 1.35 mg/dL Final  08/20/2014 11:13 AM 0.94 0.50 - 1.35 mg/dL Final   Creatinine, Ser  Date/Time Value Ref Range Status  01/22/2020 08:31 AM 2.72 (H) 0.61 - 1.24 mg/dL Final  01/21/2020 09:14 AM 2.45 (H) 0.61 - 1.24 mg/dL Final  01/20/2020  02:39 PM 2.59 (H) 0.61 - 1.24 mg/dL Final  01/11/2020 03:35 AM 6.09 (H) 0.61 - 1.24 mg/dL Final  01/10/2020 06:20 AM 6.77 (H) 0.61 - 1.24 mg/dL Final  01/09/2020 03:34 AM 7.28 (H) 0.61 - 1.24 mg/dL Final  01/08/2020 10:12 AM 7.10 (H) 0.61 - 1.24 mg/dL Final  01/07/2020 04:10 AM 6.09 (H) 0.61 - 1.24 mg/dL Final    Comment:    DELTA CHECK NOTED  01/06/2020 05:17 AM 10.10 (H) 0.61 - 1.24 mg/dL Final  01/05/2020 07:15 AM 9.66 (H) 0.61 - 1.24 mg/dL Final  01/04/2020 04:38 AM 8.39 (H) 0.61 - 1.24 mg/dL Final  01/03/2020 04:31 AM 6.75 (H) 0.61 - 1.24 mg/dL Final  01/02/2020 05:18 AM 5.37 (H) 0.61 - 1.24 mg/dL Final    Comment:    DIALYSIS  01/01/2020 04:20 AM 8.56 (H) 0.61 - 1.24 mg/dL Final  12/31/2019 06:00 AM 6.63 (H) 0.61 - 1.24 mg/dL Final  12/30/2019 04:17 AM 4.97 (H) 0.61 - 1.24 mg/dL Final    Comment:    DELTA CHECK NOTED  12/29/2019 05:15 AM 7.78 (H) 0.61 - 1.24 mg/dL Final  12/28/2019 11:13 AM 6.85 (H) 0.61 - 1.24 mg/dL Final  12/27/2019 03:44 AM 8.50 (H) 0.61 - 1.24 mg/dL Final    Comment:    DELTA CHECK NOTED  12/26/2019 05:00 PM 4.24 (H) 0.61 - 1.24 mg/dL Final    Comment:    DIALYSIS  12/26/2019 06:29 AM 10.70 (H) 0.61 - 1.24 mg/dL Final  12/25/2019 04:25 AM 8.87 (H) 0.61 - 1.24 mg/dL Final    Comment:  DELTA CHECK NOTED  12/24/2019 08:35 PM 13.31 (H) 0.61 - 1.24 mg/dL Final  12/24/2019 02:45 PM 14.23 (H) 0.61 - 1.24 mg/dL Final  12/24/2019 10:05 AM 14.45 (H) 0.61 - 1.24 mg/dL Final  12/24/2019 04:19 AM 14.97 (H) 0.61 - 1.24 mg/dL Final  11/30/2019 09:37 AM 1.57 (H) 0.76 - 1.27 mg/dL Final  11/21/2019 09:37 AM 1.44 (H) 0.76 - 1.27 mg/dL Final  04/25/2019 04:13 AM 1.49 (H) 0.61 - 1.24 mg/dL Final  04/24/2019 07:04 AM 1.44 (H) 0.61 - 1.24 mg/dL Final  04/23/2019 02:22 AM 1.75 (H) 0.61 - 1.24 mg/dL Final  04/21/2019 06:31 AM 1.40 (H) 0.61 - 1.24 mg/dL Final  11/30/2018 02:40 AM 1.03 0.61 - 1.24 mg/dL Final  11/29/2018 03:09 AM 1.21 0.61 - 1.24 mg/dL Final  11/28/2018  03:36 AM 1.18 0.61 - 1.24 mg/dL Final  11/27/2018 03:40 AM 1.07 0.61 - 1.24 mg/dL Final  11/26/2018 03:55 AM 0.94 0.61 - 1.24 mg/dL Final  11/25/2018 07:41 AM 0.96 0.61 - 1.24 mg/dL Final  11/24/2018 03:14 AM 0.98 0.61 - 1.24 mg/dL Final  11/23/2018 03:28 AM 1.16 0.61 - 1.24 mg/dL Final  11/22/2018 02:10 AM 1.91 (H) 0.61 - 1.24 mg/dL Final  11/21/2018 02:20 AM 2.37 (H) 0.61 - 1.24 mg/dL Final  11/20/2018 11:41 PM 2.54 (H) 0.61 - 1.24 mg/dL Final  11/20/2018 07:27 PM 2.73 (H) 0.61 - 1.24 mg/dL Final  10/16/2016 07:35 AM 1.14 0.61 - 1.24 mg/dL Final  12/24/2014 04:36 AM 0.76 0.50 - 1.35 mg/dL Final  12/23/2014 04:35 AM 0.81 0.50 - 1.35 mg/dL Final  12/22/2014 04:09 AM 0.77 0.50 - 1.35 mg/dL Final  12/21/2014 06:45 AM 0.91 0.50 - 1.35 mg/dL Final  12/19/2014 04:35 AM 0.94 0.50 - 1.35 mg/dL Final  12/18/2014 01:20 PM 0.88 0.50 - 1.35 mg/dL Final  12/16/2014 10:26 AM 0.99 0.50 - 1.35 mg/dL Final     PMHx:   Past Medical History:  Diagnosis Date  . Aortic insufficiency    MODERATE WITH A BICUSPID AORTIC VAVLE  . Arterial occlusive disease    MULTILEVEL  . CHF (congestive heart failure) ([place])   . COPD (chronic obstructive pulmonary disease) ([place])   . Dilated cardiomyopathy (HCC)    WITH EJECTION FRACTION DOWN TO 20-25%--WITH CONGESTIVE HEART FAILURE  . Edema    LOWER EXTREMETIES  . Hypertension   . Normal coronary arteries 2009  . Orthopnea   . Peripheral arterial disease ([place])   . Pulmonary hypertension ([place])   . SOB (shortness of breath)     Past Surgical History:  Procedure Laterality Date  . ABDOMINAL AORTAGRAM N/A 11/05/2014   Procedure: ABDOMINAL Maxcine Ham;  Surgeon: Angelia Mould, MD;  Location: [facility] CATH LAB;  Service: Cardiovascular;  Laterality: N/A;  . ABDOMINAL AORTOGRAM W/LOWER EXTREMITY Bilateral 04/21/2019   Procedure: ABDOMINAL AORTOGRAM W/LOWER EXTREMITY;  Surgeon: Angelia Mould, MD;  Location: [place] CV LAB;  Service: Cardiovascular;   Laterality: Bilateral;  . AORTA - BILATERAL FEMORAL ARTERY BYPASS GRAFT N/A 12/18/2014   Procedure: AORTOBIFEMORAL BYPASS GRAFT;  Surgeon: Angelia Mould, MD;  Location: [place];  Service: Vascular;  Laterality: N/A;  . CARDIAC CATHETERIZATION  2009   Nl Cors, EF 20%  . COLONOSCOPY    . ENDARTERECTOMY FEMORAL Left 12/18/2014   Procedure: ENDARTERECTOMY FEMORAL;  Surgeon: Angelia Mould, MD;  Location: [place];  Service: Vascular;  Laterality: Left;  . FALSE ANEURYSM REPAIR Right 04/24/2019   Procedure: REPAIR OF RIGHT FEMORAL ARTERY PSEUDOANEURYSM;  Surgeon: Rosetta Posner, MD;  Location: [facility] OR;  Service: Vascular;  Laterality: Right;  . FEMORAL-POPLITEAL BYPASS GRAFT  02/21/2011   right  . FEMORAL-TIBIAL BYPASS GRAFT Right 04/24/2019   Procedure: REDO BYPASS GRAFT RIGHT FEMORAL-TIBIAL ARTERY USING LEFT LEG VEIN & HEMASHIELD GOLD 6mm GRAFT;  Surgeon: Rosetta Posner, MD;  Location: [place];  Service: Vascular;  Laterality: Right;  . IR FLUORO GUIDE CV LINE RIGHT  12/28/2019  . IR REMOVAL TUN CV CATH W/O FL  01/11/2020  . IR US GUIDE VASC ACCESS RIGHT  12/28/2019  . OTHER SURGICAL HISTORY     POST RIGHT FEMOROPOPLITEAL BYPASS GRAFT  . PERIPHERAL VASCULAR CATHETERIZATION  11/05/2014   Procedure: LOWER EXTREMITY ANGIOGRAPHY;  Surgeon: Angelia Mould, MD;  Location: [facility] CATH LAB;  Service: Cardiovascular;;  . VEIN HARVEST Left 04/24/2019   Procedure: LEFT LEG SAPHENOUS VEIN HARVEST;  Surgeon: Rosetta Posner, MD;  Location: [facility] OR;  Service: Vascular;  Laterality: Left;    Family Hx:  Family History  Problem Relation Age of Onset  . Diabetes Mother   . Hyperlipidemia Sister   . Hypertension Sister     Social History:  reports that he quit smoking about 15 months ago. He has a 40.00 pack-year smoking history. He has never used smokeless tobacco. He reports that he does not drink alcohol or use drugs.  Allergies: No Known Allergies  Medications: Prior to Admission medications    Medication Sig Start Date End Date Taking? Authorizing Provider  ACCU-CHEK FASTCLIX LANCETS [place]  07/30/18  Yes [provider]  Accu-Chek FastClix Lancets MISC USE TO MONITOR BLOOD GLUCOSE 2 TIMES DAILY 11/06/19  Yes [provider]  ACCU-CHEK GUIDE test strip  11/30/18  Yes [provider]  amLODipine (NORVASC) 5 MG tablet Take 1 tablet (5 mg total) by mouth daily. 01/05/19  Yes Martinique, Peter M, MD  BD PEN NEEDLE NANO U/F 32G X 4 MM MISC  08/06/18  Yes [provider]  carvedilol (COREG) 6.25 MG tablet Take 1 tablet (6.25 mg total) by mouth 2 (two) times daily with a meal. 01/11/20 03/11/20 Yes Kc, Ramesh, MD  glucose blood (ACCU-CHEK GUIDE) test strip one strip (1 each dose) by Other route 2 (two) times daily. Test blood glucose 2 times a day. 11/21/19  Yes [provider]  guaiFENesin (MUCINEX) 600 MG 12 hr tablet Take 2 tablets (1,200 mg total) by mouth 2 (two) times daily. Patient taking differently: Take 600 mg by mouth 2 (two) times daily.  11/30/18  Yes Lavina Hamman, MD  insulin glargine (LANTUS) 100 UNIT/ML injection Inject 0.06 mLs (6 Units total) into the skin at bedtime. 01/11/20  Yes Kc, Maren Beach, MD  Insulin Pen Needle (BD PEN NEEDLE NANO U/F) 32G X 4 MM MISC USE TO INJECT INTO THE SKIN DAILY. 11/06/19  Yes [provider]  levETIRAcetam (KEPPRA) 500 MG tablet Take 1 tablet (500 mg total) by mouth 2 (two) times daily. 01/11/20 02/10/20 Yes Antonieta Pert, MD  Multiple Vitamin (MULTIVITAMIN WITH MINERALS) TABS tablet Take 1 tablet by mouth daily.   Yes [provider]  Nutritional Supplements (FEEDING SUPPLEMENT, NEPRO CARB STEADY,) LIQD Take 237 mLs by mouth 3 (three) times daily between meals for 15 days. 01/11/20 01/26/20 Yes Antonieta Pert, MD  OXYGEN Inhale 4.5 L into the lungs continuous.   Yes [provider]  rosuvastatin (CRESTOR) 40 MG tablet Take 1 tablet (40 mg total) by mouth daily. 12/01/19 02/29/20 Yes Martinique, Peter M, MD   umeclidinium-vilanterol [employer] [facility]  ELLIPTA) 62.5-25 MCG/INH AEPB Inhale 1 puff into the lungs daily. 08/07/19  Yes Lauraine Rinne, NP  warfarin (COUMADIN) 2 MG tablet Take 1 tablet (2 mg total) by mouth daily at 6 PM. 01/11/20 02/10/20 Yes Kc, Ramesh, MD  ANORO ELLIPTA 62.5-25 MCG/INH AEPB INHALE 1 PUFF BY MOUTH ONCE DAILY . APPOINTMENT REQUIRED FOR FUTURE REFILLS Patient not taking: No sig reported 07/31/19   Rigoberto Noel, MD  furosemide (LASIX) 20 MG tablet Take 1 tablet (20 mg total) by mouth 2 (two) times daily. 01/20/20   Pattricia Boss, MD     Labs:  Results for orders placed or performed during the hospital encounter of 01/20/20 (from the past 48 hour(s))  CBG monitoring, ED     Status: Abnormal   Collection Time: 01/20/20  8:03 PM  Result Value Ref Range   Glucose-Capillary 156 (H) 70 - 99 mg/dL    Comment: Glucose reference range applies only to samples taken after fasting for at least 8 hours.  Glucose, capillary     Status: Abnormal   Collection Time: 01/21/20 12:36 AM  Result Value Ref Range   Glucose-Capillary 141 (H) 70 - 99 mg/dL    Comment: Glucose reference range applies only to samples taken after fasting for at least 8 hours.  Glucose, capillary     Status: Abnormal   Collection Time: 01/21/20  4:08 AM  Result Value Ref Range   Glucose-Capillary 163 (H) 70 - 99 mg/dL    Comment: Glucose reference range applies only to samples taken after fasting for at least 8 hours.  CBC     Status: Abnormal   Collection Time: 01/21/20  7:37 AM  Result Value Ref Range   WBC 10.1 4.0 - 10.5 K/uL   RBC 3.14 (L) 4.22 - 5.81 MIL/uL   Hemoglobin 8.9 (L) 13.0 - 17.0 g/dL   HCT 29.0 (L) 39.0 - 52.0 %   MCV 92.4 80.0 - 100.0 fL   MCH 28.3 26.0 - 34.0 pg   MCHC 30.7 30.0 - 36.0 g/dL   RDW 18.0 (H) 11.5 - 15.5 %   Platelets 344 150 - 400 K/uL   nRBC 0.3 (H) 0.0 - 0.2 %    Comment: Performed at [facility] [address], [place], [place] [address]  Protime-INR     Status: Abnormal    Collection Time: 01/21/20  7:37 AM  Result Value Ref Range   Prothrombin Time 20.1 (H) 11.4 - 15.2 seconds   INR 1.7 (H) 0.8 - 1.2    Comment: (NOTE) INR goal varies based on device and disease states. Performed at [facility], [place] [address], [place], [place] [address]   Glucose, capillary     Status: None   Collection Time: 01/21/20  7:47 AM  Result Value Ref Range   Glucose-Capillary 76 70 - 99 mg/dL    Comment: Glucose reference range applies only to samples taken after fasting for at least 8 hours.  Comprehensive metabolic panel     Status: Abnormal   Collection Time: 01/21/20  9:14 AM  Result Value Ref Range   Sodium 144 135 - 145 mmol/L   Potassium 4.2 3.5 - 5.1 mmol/L   Chloride 107 98 - 111 mmol/L   CO2 27 22 - 32 mmol/L   Glucose, Bld 155 (H) 70 - 99 mg/dL    Comment: Glucose reference range applies only to samples taken after fasting for at least 8 hours.   BUN 24 (H)  8 - 23 mg/dL   Creatinine, Ser 2.45 (H) 0.61 - 1.24 mg/dL   Calcium 9.0 8.9 - 10.3 mg/dL   Total Protein 6.4 (L) 6.5 - 8.1 g/dL   Albumin 2.3 (L) 3.5 - 5.0 g/dL   AST 19 15 - 41 U/L   ALT 21 0 - 44 U/L   Alkaline Phosphatase 72 38 - 126 U/L   Total Bilirubin 0.9 0.3 - 1.2 mg/dL   GFR calc non Af Amer 26 (L) >60 mL/min   GFR calc Af Amer 30 (L) >60 mL/min   Anion gap 10 5 - 15    Comment: Performed at [facility] [address] [place], [place] [address]  Glucose, capillary     Status: Abnormal   Collection Time: 01/21/20 12:09 PM  Result Value Ref Range   Glucose-Capillary 113 (H) 70 - 99 mg/dL    Comment: Glucose reference range applies only to samples taken after fasting for at least 8 hours.  Glucose, capillary     Status: Abnormal   Collection Time: 01/21/20  4:15 PM  Result Value Ref Range   Glucose-Capillary 138 (H) 70 - 99 mg/dL    Comment: Glucose reference range applies only to samples taken after fasting for at least 8 hours.  Glucose, capillary     Status: Abnormal    Collection Time: 01/21/20  8:11 PM  Result Value Ref Range   Glucose-Capillary 189 (H) 70 - 99 mg/dL    Comment: Glucose reference range applies only to samples taken after fasting for at least 8 hours.  Glucose, capillary     Status: Abnormal   Collection Time: 01/22/20 12:31 AM  Result Value Ref Range   Glucose-Capillary 107 (H) 70 - 99 mg/dL    Comment: Glucose reference range applies only to samples taken after fasting for at least 8 hours.  Glucose, capillary     Status: Abnormal   Collection Time: 01/22/20  4:24 AM  Result Value Ref Range   Glucose-Capillary 133 (H) 70 - 99 mg/dL    Comment: Glucose reference range applies only to samples taken after fasting for at least 8 hours.  Protime-INR     Status: Abnormal   Collection Time: 01/22/20  5:11 AM  Result Value Ref Range   Prothrombin Time 19.7 (H) 11.4 - 15.2 seconds   INR 1.7 (H) 0.8 - 1.2    Comment: (NOTE) INR goal varies based on device and disease states. Performed at [facility], [place] [address], [place], [place] [address]   Glucose, capillary     Status: Abnormal   Collection Time: 01/22/20  8:29 AM  Result Value Ref Range   Glucose-Capillary 150 (H) 70 - 99 mg/dL    Comment: Glucose reference range applies only to samples taken after fasting for at least 8 hours.  CBC with Differential/Platelet     Status: Abnormal   Collection Time: 01/22/20  8:31 AM  Result Value Ref Range   WBC 8.1 4.0 - 10.5 K/uL   RBC 3.32 (L) 4.22 - 5.81 MIL/uL   Hemoglobin 9.4 (L) 13.0 - 17.0 g/dL   HCT 30.3 (L) 39.0 - 52.0 %   MCV 91.3 80.0 - 100.0 fL   MCH 28.3 26.0 - 34.0 pg   MCHC 31.0 30.0 - 36.0 g/dL   RDW 17.6 (H) 11.5 - 15.5 %   Platelets 370 150 - 400 K/uL   nRBC 0.4 (H) 0.0 - 0.2 %   Neutrophils Relative %  67 %   Neutro Abs 5.5 1.7 - 7.7 K/uL   Lymphocytes Relative 18 %   Lymphs Abs 1.5 0.7 - 4.0 K/uL   Monocytes Relative 11 %   Monocytes Absolute 0.9 0.1 - 1.0 K/uL   Eosinophils Relative 2 %   Eosinophils  Absolute 0.2 0.0 - 0.5 K/uL   Basophils Relative 1 %   Basophils Absolute 0.0 0.0 - 0.1 K/uL   Immature Granulocytes 1 %   Abs Immature Granulocytes 0.07 0.00 - 0.07 K/uL    Comment: Performed at [facility] [address] [place], [place] [address]  Comprehensive metabolic panel     Status: Abnormal   Collection Time: 01/22/20  8:31 AM  Result Value Ref Range   Sodium 140 135 - 145 mmol/L   Potassium 4.0 3.5 - 5.1 mmol/L   Chloride 101 98 - 111 mmol/L   CO2 27 22 - 32 mmol/L   Glucose, Bld 164 (H) 70 - 99 mg/dL    Comment: Glucose reference range applies only to samples taken after fasting for at least 8 hours.   BUN 30 (H) 8 - 23 mg/dL   Creatinine, Ser 2.72 (H) 0.61 - 1.24 mg/dL   Calcium 9.1 8.9 - 10.3 mg/dL   Total Protein 6.8 6.5 - 8.1 g/dL   Albumin 2.5 (L) 3.5 - 5.0 g/dL   AST 18 15 - 41 U/L   ALT 20 0 - 44 U/L   Alkaline Phosphatase 71 38 - 126 U/L   Total Bilirubin 0.9 0.3 - 1.2 mg/dL   GFR calc non Af Amer 23 (L) >60 mL/min   GFR calc Af Amer 27 (L) >60 mL/min   Anion gap 12 5 - 15    Comment: Performed at [facility] [address] [place], [place] [address]  Glucose, capillary     Status: None   Collection Time: 01/22/20 12:35 PM  Result Value Ref Range   Glucose-Capillary 76 70 - 99 mg/dL    Comment: Glucose reference range applies only to samples taken after fasting for at least 8 hours.     ROS: General fatigue and weak Eyes no visual loss Ears nose mouth throat no hearing loss sore throat epistaxis Cardiovascular complains of shortness of breath which is progressive no chest pain Respiratory no cough wheeze abscess Abdominal system no nausea vomiting abdominal pain or diarrhea Urogenital no urgency frequency dysuria Neurologic no stroke seizures numbness tingling pins-and-needles     Physical Exam: Vitals:   01/22/20 1253 01/22/20 1534  BP: (!) 118/58 123/72  Pulse: 88 89  Resp: 20 18  Temp: 97.8 F (36.6 C)   SpO2: 92% 94%      General: Gen alert, in bed in ICU, HOH, Ox 3 No rash, cyanosis or gangrene Sclera anicteric, throat clear and mildly dry   No jvd or bruits Chest clear bilat to bases, no rales RRR no MRG Abd soft ntnd no mass or ascites, slightly protuberant GU normal male, no cath in  MS no joint effusions or deformity Ext no LE edema, no wounds or ulcers Neuro is alert, Ox 3 , nf, no asterixis  Assessment/Plan: 1.Acute on chronic kidney disease.  Patiently recently discharged with acute kidney injury and required dialysis on a as needed basis last dialysis was 01/11/2020.  Urine microscopy 04/22/2020 showed 6-10 RBCs per high-powered film.  There is also 100 mg deciliter proteinuria.  Creatinine has improved somewhat since 01/11/2020.  The concern would be the  slightly worsening features of congestive heart failure.  Diuresis is being excellent over the last 24 hours and will continue 60 mg IV Lasix every 24 hours.  Baseline serum creatinine appears to be about 1.5.  We will continue to avoid ACE inhibitors. 2. Hypertension/volume  -chest x-ray would suggest volume overload with congestive heart failure with continue IV diuresis at this point.  Continue 60 mg IV Lasix.  And continue to follow renal panel.  Continue to hold antihypertensive medications 3. Anemia  -we will check iron studies and add darbepoetin if needed. 4.  Bone mineral will continue to follow renal panels and monitor calcium phosphorus. 5.  Atrial fibrillation patient was on anticoagulation with Eliquis.  This is now converted to Coumadin due to his acute kidney injury 6.  Diabetes mellitus type 2 per primary service. 7.  History of peripheral vascular disease status post redo right femoral pop bypass.    Sherril Croon 01/22/2020, 4:12 PM

## 2020-01-22 NOTE — Progress Notes (Signed)
ANTICOAGULATION CONSULT NOTE - Follow Up Consult  Pharmacy Consult for Warfarin Indication: atrial fibrillation  No Known Allergies  Patient Measurements: Height: 5\' 11"  (180.3 cm) Weight: 145 lb 3.2 oz (65.9 kg) IBW/kg (Calculated) : 75.3  Vital Signs: Temp: 97.3 F (36.3 C) (03/29 0832) Temp Source: Oral (03/29 0832) BP: 126/69 (03/29 0832) Pulse Rate: 84 (03/29 0832)  Labs: Recent Labs    01/20/20 1439 01/20/20 1439 01/20/20 1527 01/20/20 1527 01/21/20 0737 01/21/20 0914 01/22/20 0511 01/22/20 0831  HGB 9.3*   < > 8.8*   < > 8.9*  --   --  9.4*  HCT 31.0*   < > 26.0*  --  29.0*  --   --  30.3*  PLT 351  --   --   --  344  --   --  370  LABPROT 19.9*  --   --   --  20.1*  --  19.7*  --   INR 1.7*  --   --   --  1.7*  --  1.7*  --   CREATININE 2.59*  --   --   --   --  2.45*  --   --    < > = values in this interval not displayed.    Estimated Creatinine Clearance: 27.3 mL/min (A) (by C-G formula based on SCr of 2.45 mg/dL (H)).   Medical History: Past Medical History:  Diagnosis Date  . Aortic insufficiency    MODERATE WITH A BICUSPID AORTIC VAVLE  . Arterial occlusive disease    MULTILEVEL  . CHF (congestive heart failure) ([place])   . COPD (chronic obstructive pulmonary disease) ([place])   . Dilated cardiomyopathy (HCC)    WITH EJECTION FRACTION DOWN TO 20-25%--WITH CONGESTIVE HEART FAILURE  . Edema    LOWER EXTREMETIES  . Hypertension   . Normal coronary arteries 2009  . Orthopnea   . Peripheral arterial disease ([place])   . Pulmonary hypertension ([place])   . SOB (shortness of breath)     Medications:  Scheduled:  . amLODipine  5 mg Oral Daily  . carvedilol  6.25 mg Oral BID WC  . ferrous sulfate  325 mg Oral Q breakfast  . furosemide  60 mg Intravenous BID  . insulin aspart  0-9 Units Subcutaneous Q4H  . levETIRAcetam  500 mg Oral BID  . rosuvastatin  10 mg Oral q1800  . umeclidinium-vilanterol  1 puff Inhalation Daily  . Warfarin - Pharmacist  Dosing Inpatient   Does not apply q1800    Assessment: Patient is a 75 yom that is being admitted for SOB. The patient has a Hx of Afib and is on warfarin for this. Patient was on apixaban prior to a recent AKI. If renal function remains improve after admission and diureses may consider switching back to apixaban. PTA warfarin regimen 2mg  daily.  INR today remains unchanged and subtherapeutic at 1.7. H/H improved. Plt wnl. No significant drug interactions noted   Goal of Therapy:  INR 2-3 Monitor platelets by anticoagulation protocol: Yes   Plan:  - warfarin 4 mg PO x 1 dose tonight  - Monitor daily INR and CBC  - Monitor for bleeding   Thank you,   Albertina Parr, PharmD., BCPS Clinical Pharmacist Clinical phone for 01/22/20 until 3:30pm: [phone] If after 3:30pm, please refer to [facility] for unit-specific pharmacist

## 2020-01-22 NOTE — Progress Notes (Addendum)
PROGRESS NOTE    QUEST TAVENNER  YTK:[id number] DOB: November 15, 1950 DOA: 01/20/2020 PCP: Alonna Buckler, MD   Brief Narrative:  Kyle [name] is Kyle [name] 69 y.o. 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  He notes that after discharge, he was initially feeling well.  Kyle [name] few days after, became SOB.  This has been progressive.  He denies CP, fevers, chills, change in taste or smell, sick contacts, covid contacts, N/V/D.  Denies smoking or drinking.  His medications were changed at discharge (his eliquis was transitioned to warfarin while his renal function was decreased - in addition, his ACE and diuretic were being held - metformin was discontinued).     Assessment & Plan:   Active Problems:   Heart failure ([place])  Acute on Chronic Hypoxic Respiratory Failure  Heart Failure with Preserved Ejection Fracture Exacerbation  Nonischemic Cardiomyopathy: Echo from 2/17 with EF 65-70%, severe concentric LVH, diastolic dysfunction (EF 55-73% in 2012) CXR with mild CHF Repeat CXR 3/28 similar 3/29 with worsening CHF His lasix was held on discharge at last hospitalization Continue lasix 60 mg BID Continue coreg, quinapril on hold in setting of elevated creatinine Strict I/O, daily weights  Wt Readings from Last 3 Encounters:  01/22/20 65.9 kg  01/10/20 76.5 kg  11/30/19 71.7 kg   COPD on 5 L Chronically:  Continue anoro Continue prn nebs No current indication for steroids  AKI on CKD IIIa: had aki in setting of volume depletion due to vomiting, diarrhea, as well as ace inhibitor at last hospital admission. Baseline creatinine 1.57 (2/21) -> last admission peaked at 14.97, required dialysis -> creatinine improved, but  worse with diuresis today to 2.72 Continue to hold quinapril Follow with diuresis  Will ask for nephrology's assistance with diuresis, appreciate assistance  Atrial Fibrillation: continue warfarin -> if creatinine continues to improve, consider transition back to eliquis Subtherapeutic INR, phramacy to dose warfarin  Hypertension: coreg, lasix - ace on hold  PAD: s/p redo right fem pop bypass.  Needs f/u with vascular.  Type 2 Diabetes: A1c 8.0.  Stop lantus with AM BG < 100.  Follow with SSI.  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   Start iron  Concern for seizures: episode during last hospitalization where he became nonverbal and flaccid in bilateral upper extremities.  No seizures on spot EEG and LTM.  Concern for possible seizure at that time, recommended keppra 500 mg BID and seizure precautions. Needs outpatient neurology follow up (see neuro note from 12/28/19)  DVT prophylaxis: warfarin Code Status: full Family Communication: wife over phone Disposition Plan:  . Patient came from: home            . Anticipated d/c place: . Barriers to d/c OR conditions which need to be met to effect Kyle [name] safe d/c: persistent pulm edema, needs additional IV diuresis, transition to inpatient status  Consultants:   none  Procedures:   none  Antimicrobials:  Anti-infectives (From admission, onward)   None     Subjective: Frustrated overall with hospitalization  Objective: Vitals:   01/22/20 0424 01/22/20 0832 01/22/20 0837 01/22/20 1253  BP: 122/71 126/69  (!) 118/58  Pulse: 88 84  88  Resp: '19 17  20  '$ Temp: 98.3 F (36.8 C) (!) 97.3 F (36.3 C)  97.8 F (36.6 C)  TempSrc: Oral Oral  Oral  SpO2: 91% 94% 92% 92%  Weight: 65.9 kg     Height:        Intake/Output Summary (Last 24 hours) at 01/22/2020 1409 Last data filed at 01/22/2020 1300 Gross per 24 hour  Intake  840 ml  Output 2775 ml  Net -1935 ml   Filed Weights   01/20/20 2100 01/21/20 0029 01/22/20 0424  Weight: 67.9 kg 67.2 kg 65.9 kg    Examination:  General: No acute distress. Cardiovascular: Heart sounds show Pamela Maddy regular rate, and rhythm Lungs: bibasilar crackles Abdomen: Soft, nontender, nondistended  Neurological: Alert and oriented 3. Moves all extremities 4 . Cranial nerves II through XII grossly intact. Skin: Warm and dry. No rashes or lesions. Extremities: No clubbing or cyanosis. No edema.     Data Reviewed: I have personally reviewed following labs and imaging studies  CBC: Recent Labs  Lab 01/20/20 1439 01/20/20 1527 01/21/20 0737 01/22/20 0831  WBC 11.1*  --  10.1 8.1  NEUTROABS  --   --   --  5.5  HGB 9.3* 8.8* 8.9* 9.4*  HCT 31.0* 26.0* 29.0* 30.3*  MCV 96.0  --  92.4 91.3  PLT 351  --  344 409   Basic Metabolic Panel: Recent Labs  Lab 01/20/20 1439 01/20/20 1527 01/21/20 0914 01/22/20 0831  NA 144 143 144 140  K 4.2 4.1 4.2 4.0  CL 110  --  107 101  CO2 23  --  27 27  GLUCOSE 250*  --  155* 164*  BUN 25*  --  24* 30*  CREATININE 2.59*  --  2.45* 2.72*  CALCIUM 9.0  --  9.0 9.1   GFR: Estimated Creatinine Clearance: 24.6 mL/min (Hussien Greenblatt) (by C-G formula based on SCr of 2.72 mg/dL (H)). Liver Function Tests: Recent Labs  Lab 01/20/20 1439 01/21/20 0914 01/22/20 0831  AST '19 19 18  '$ ALT '20 21 20  '$ ALKPHOS 84 72 71  BILITOT 0.8 0.9 0.9  PROT 6.8 6.4* 6.8  ALBUMIN 2.6* 2.3* 2.5*   No results for input(s): LIPASE, AMYLASE in the last 168 hours. No results for input(s): AMMONIA in the last 168 hours. Coagulation Profile: Recent Labs  Lab 01/20/20 1439 01/21/20 0737 01/22/20 0511  INR 1.7* 1.7* 1.7*   Cardiac Enzymes: No results for input(s): CKTOTAL, CKMB, CKMBINDEX, TROPONINI in the last 168 hours. BNP (last 3 results) No results for input(s): PROBNP in the last 8760 hours. HbA1C: No results for input(s): HGBA1C in the last 72  hours. CBG: Recent Labs  Lab 01/21/20 2011 01/22/20 0031 01/22/20 0424 01/22/20 0829 01/22/20 1235  GLUCAP 189* 107* 133* 150* 76   Lipid Profile: No results for input(s): CHOL, HDL, LDLCALC, TRIG, CHOLHDL, LDLDIRECT in the last 72 hours. Thyroid Function Tests: No results for input(s): TSH, T4TOTAL, FREET4, T3FREE, THYROIDAB in the last 72 hours. Anemia Panel: Recent Labs    01/20/20 1439  VITAMINB12 429  FOLATE 38.6  FERRITIN 437*  TIBC 193*  IRON 24*   Sepsis Labs: No results for input(s): PROCALCITON, LATICACIDVEN in the last 168 hours.  Recent Results (from the past 240 hour(s))  SARS CORONAVIRUS 2 (TAT 6-24 HRS) Nasopharyngeal Nasopharyngeal Swab     Status: None   Collection Time: 01/20/20  2:10 PM   Specimen: Nasopharyngeal Swab  Result Value  Ref Range Status   SARS Coronavirus 2 NEGATIVE NEGATIVE Final    Comment: (NOTE) SARS-CoV-2 target nucleic acids are NOT DETECTED. The SARS-CoV-2 RNA is generally detectable in upper and lower respiratory specimens during the acute phase of infection. Negative results do not preclude SARS-CoV-2 infection, do not rule out co-infections with other pathogens, and should not be used as the sole basis for treatment or other patient management decisions. Negative results must be combined with clinical observations, patient history, and epidemiological information. The expected result is Negative. Fact Sheet for Patients: SugarRoll.be Fact Sheet for Healthcare Providers: https://www.woods-mathews.com/ This test is not yet approved or cleared by the [place] FDA and  has been authorized for detection and/or diagnosis of SARS-CoV-2 by FDA under an Emergency Use Authorization (EUA). This EUA will remain  in effect (meaning this test can be used) for the duration of the COVID-19 declaration under Section 56 4(b)(1) of the Act, 21 U.S.C. section 360bbb-3(b)(1), unless the authorization  is terminated or revoked sooner. Performed at [facility], [place] [address], [place], [place] [address]          Radiology Studies: DG CHEST PORT 1 VIEW  Result Date: 01/22/2020 CLINICAL DATA:  69 year old male with history of hypoxia. History of pulmonary edema. EXAM: PORTABLE CHEST 1 VIEW COMPARISON:  Chest x-ray 01/21/2020. FINDINGS: There is cephalization of the pulmonary vasculature and slight indistinctness of the interstitial markings suggestive of mild pulmonary edema. No definite pleural effusions. Mild cardiomegaly. Upper mediastinal contours are within normal limits. IMPRESSION: 1. Slight worsening of congestive heart failure, as above. Electronically Signed   By: Vinnie Langton M.D.   On: 01/22/2020 12:16   Portable chest 1 View  Result Date: 01/21/2020 CLINICAL DATA:  COPD, ischemic cardiomyopathy EXAM: PORTABLE CHEST 1 VIEW COMPARISON:  01/20/2020 FINDINGS: Bilateral diffuse interstitial thickening. No pleural effusion or pneumothorax. Stable cardiomediastinal silhouette. No acute osseous abnormality. IMPRESSION: Mild CHF. Electronically Signed   By: Kathreen Devoid   On: 01/21/2020 09:33   DG Chest Port 1 View  Result Date: 01/20/2020 CLINICAL DATA:  Shortness of breath since Thursday EXAM: PORTABLE CHEST 1 VIEW COMPARISON:  January 07, 2020 FINDINGS: The mediastinal contour is normal. The heart size is enlarged. Mild increased pulmonary interstitium is identified bilaterally. Lungs are hyperinflated. The bony structures are stable. IMPRESSION: Mild congestive heart failure. Electronically Signed   By: Abelardo Diesel M.D.   On: 01/20/2020 14:41        Scheduled Meds: . amLODipine  5 mg Oral Daily  . carvedilol  6.25 mg Oral BID WC  . ferrous sulfate  325 mg Oral Q breakfast  . furosemide  60 mg Intravenous Once  . insulin aspart  0-9 Units Subcutaneous Q4H  . levETIRAcetam  500 mg Oral BID  . rosuvastatin  10 mg Oral q1800  . umeclidinium-vilanterol  1 puff  Inhalation Daily  . warfarin  4 mg Oral ONCE-1600  . Warfarin - Pharmacist Dosing Inpatient   Does not apply q1800   Continuous Infusions:   LOS: 1 day    Time spent: over 30 min    Fayrene Helper, MD [facility]   To contact the attending provider between 7A-7P or the covering provider during after hours 7P-7A, please log into the web site [email or web address] and access using universal [place] password for that web site. If you do not have the password, please call the hospital operator.  01/22/2020, 2:09 PM

## 2020-01-22 NOTE — Telephone Encounter (Signed)
Spoke with the pt's spouse  She states had to take pt to ED on 01/20/20 and he was admitted for CHF  He is starting to improve with increased lasix dose and will be discharged home 01/22/20 possibly  Pt has appt with [name] pending 01/29/20 and I advised to be sure and keep this appt  Will forward to Dr Elsworth Soho as Juluis Rainier

## 2020-01-22 NOTE — Progress Notes (Signed)
Physical Therapy Evaluation Patient Details Name: Kyle [name] MRN: [id number] DOB: 01/25/1951 Today's Date: 01/22/2020   History of Present Illness  Pt adm 3/27 with acute on chronic respiratory failure and heart failure. Pt with recent admission for acute renal and respiratory failure. PMH - cardiomyopathy, copd on 5L, PAD, HTN, DM  Clinical Impression  Pt presents to PT with slightly unsteady gait due to illness and inactivity. Expect pt will make good progress back to baseline with mobility. Will follow acutely but doubt pt will need PT after DC.      Follow Up Recommendations No PT follow up;Supervision - Intermittent    Equipment Recommendations  None recommended by PT    Recommendations for Other Services       Precautions / Restrictions Precautions Precautions: Other (comment) Precaution Comments: watch SpO2 Restrictions Weight Bearing Restrictions: No      Mobility  Bed Mobility Overal bed mobility: Modified Independent Bed Mobility: Supine to Sit;Sit to Supine     Supine to sit: Modified independent (Device/Increase time);HOB elevated Sit to supine: Modified independent (Device/Increase time);HOB elevated      Transfers Overall transfer level: Needs assistance Equipment used: None Transfers: Sit to/from Stand Sit to Stand: Supervision         General transfer comment: Assist for safety and lines  Ambulation/Gait Ambulation/Gait assistance: Min guard Gait Distance (Feet): 275 Feet Assistive device: Rolling walker (2 wheeled) Gait Pattern/deviations: Step-through pattern;Decreased stride length Gait velocity: decr Gait velocity interpretation: 1.31 - 2.62 ft/sec, indicative of limited community ambulator General Gait Details: Assist for safety and lines. Amb on 8L of O2 with SpO2 92%  Stairs            Wheelchair Mobility    Modified Rankin (Stroke Patients Only)       Balance Overall balance assessment: Needs  assistance Sitting-balance support: No upper extremity supported;Feet supported Sitting balance-Leahy Scale: Good     Standing balance support: No upper extremity supported;During functional activity Standing balance-Leahy Scale: Fair                               Pertinent Vitals/Pain Pain Assessment: No/denies pain    Home Living Family/patient expects to be discharged to:: Private residence Living Arrangements: Spouse/significant other Available Help at Discharge: Family;Available 24 hours/day Type of Home: Apartment Home Access: Level entry     Home Layout: One level Home Equipment: None Additional Comments: Home O2 at 5L    Prior Function Level of Independence: Independent               Hand Dominance   Dominant Hand: Right    Extremity/Trunk Assessment   Upper Extremity Assessment Upper Extremity Assessment: Overall WFL for tasks assessed    Lower Extremity Assessment Lower Extremity Assessment: Generalized weakness    Cervical / Trunk Assessment Cervical / Trunk Assessment: Normal  Communication   Communication: No difficulties  Cognition Arousal/Alertness: Awake/alert Behavior During Therapy: WFL for tasks assessed/performed Overall Cognitive Status: Within Functional Limits for tasks assessed                                        General Comments      Exercises     Assessment/Plan    PT Assessment Patient needs continued PT services  PT Problem List Decreased strength;Decreased activity tolerance;Decreased balance;Decreased mobility  PT Treatment Interventions DME instruction;Gait training;Functional mobility training;Therapeutic activities;Therapeutic exercise;Balance training;Patient/family education    PT Goals (Current goals can be found in the Care Plan section)  Acute Rehab PT Goals Patient Stated Goal: go home PT Goal Formulation: With patient Time For Goal Achievement: 01/29/20 Potential  to Achieve Goals: Good    Frequency Min 3X/week   Barriers to discharge        Co-evaluation               AM-PAC PT "6 Clicks" Mobility  Outcome Measure Help needed turning from your back to your side while in a flat bed without using bedrails?: None Help needed moving from lying on your back to sitting on the side of a flat bed without using bedrails?: None Help needed moving to and from a bed to a chair (including a wheelchair)?: A Little Help needed standing up from a chair using your arms (e.g., wheelchair or bedside chair)?: A Little Help needed to walk in hospital room?: A Little Help needed climbing 3-5 steps with a railing? : A Little 6 Click Score: 20    End of Session Equipment Utilized During Treatment: Oxygen Activity Tolerance: Patient tolerated treatment well Patient left: in bed;with call bell/phone within reach;with nursing/sitter in room Nurse Communication: Mobility status PT Visit Diagnosis: Unsteadiness on feet (R26.81);Muscle weakness (generalized) (M62.81);Difficulty in walking, not elsewhere classified (R26.2)    Time: 3005-1102 PT Time Calculation (min) (ACUTE ONLY): 19 min   Charges:   PT Evaluation $PT Eval Low Complexity: [place] Pager [phone] Office [place] 01/22/2020, 4:09 PM

## 2020-01-22 NOTE — Progress Notes (Signed)
Patient oxygen saturation on 7L 88%, put patient back to 8L. 91-92%/8L. Will continue to monitor patient.

## 2020-01-23 ENCOUNTER — Inpatient Hospital Stay (HOSPITAL_COMMUNITY): Payer: Medicare HMO

## 2020-01-23 DIAGNOSIS — R0902 Hypoxemia: Secondary | ICD-10-CM

## 2020-01-23 LAB — COMPREHENSIVE METABOLIC PANEL
ALT: 19 U/L (ref 0–44)
AST: 17 U/L (ref 15–41)
Albumin: 2.6 g/dL — ABNORMAL LOW (ref 3.5–5.0)
Alkaline Phosphatase: 82 U/L (ref 38–126)
Anion gap: 15 (ref 5–15)
BUN: 35 mg/dL — ABNORMAL HIGH (ref 8–23)
CO2: 25 mmol/L (ref 22–32)
Calcium: 9 mg/dL (ref 8.9–10.3)
Chloride: 100 mmol/L (ref 98–111)
Creatinine, Ser: 2.69 mg/dL — ABNORMAL HIGH (ref 0.61–1.24)
GFR calc Af Amer: 27 mL/min — ABNORMAL LOW (ref >60)
GFR calc non Af Amer: 23 mL/min — ABNORMAL LOW (ref >60)
Glucose, Bld: 114 mg/dL — ABNORMAL HIGH (ref 70–99)
Potassium: 4 mmol/L (ref 3.5–5.1)
Sodium: 140 mmol/L (ref 135–145)
Total Bilirubin: 0.8 mg/dL (ref 0.3–1.2)
Total Protein: 7.2 g/dL (ref 6.5–8.1)

## 2020-01-23 LAB — CBC WITH DIFFERENTIAL/PLATELET
Abs Immature Granulocytes: 0.11 10*3/uL — ABNORMAL HIGH (ref 0.00–0.07)
Basophils Absolute: 0 10*3/uL (ref 0.0–0.1)
Basophils Relative: 1 %
Eosinophils Absolute: 0.2 10*3/uL (ref 0.0–0.5)
Eosinophils Relative: 2 %
HCT: 31.1 % — ABNORMAL LOW (ref 39.0–52.0)
Hemoglobin: 9.6 g/dL — ABNORMAL LOW (ref 13.0–17.0)
Immature Granulocytes: 1 %
Lymphocytes Relative: 22 %
Lymphs Abs: 1.7 10*3/uL (ref 0.7–4.0)
MCH: 28.6 pg (ref 26.0–34.0)
MCHC: 30.9 g/dL (ref 30.0–36.0)
MCV: 92.6 fL (ref 80.0–100.0)
Monocytes Absolute: 1.1 10*3/uL — ABNORMAL HIGH (ref 0.1–1.0)
Monocytes Relative: 14 %
Neutro Abs: 4.6 10*3/uL (ref 1.7–7.7)
Neutrophils Relative %: 60 %
Platelets: 400 10*3/uL (ref 150–400)
RBC: 3.36 MIL/uL — ABNORMAL LOW (ref 4.22–5.81)
RDW: 17.4 % — ABNORMAL HIGH (ref 11.5–15.5)
WBC: 7.8 10*3/uL (ref 4.0–10.5)
nRBC: 0 % (ref 0.0–0.2)

## 2020-01-23 LAB — PROTIME-INR
INR: 1.9 — ABNORMAL HIGH (ref 0.8–1.2)
Prothrombin Time: 21.5 seconds — ABNORMAL HIGH (ref 11.4–15.2)

## 2020-01-23 LAB — GLUCOSE, CAPILLARY
Glucose-Capillary: 101 mg/dL — ABNORMAL HIGH (ref 70–99)
Glucose-Capillary: 117 mg/dL — ABNORMAL HIGH (ref 70–99)
Glucose-Capillary: 117 mg/dL — ABNORMAL HIGH (ref 70–99)
Glucose-Capillary: 141 mg/dL — ABNORMAL HIGH (ref 70–99)
Glucose-Capillary: 228 mg/dL — ABNORMAL HIGH (ref 70–99)
Glucose-Capillary: 98 mg/dL (ref 70–99)

## 2020-01-23 LAB — MAGNESIUM: Magnesium: 1.6 mg/dL — ABNORMAL LOW (ref 1.7–2.4)

## 2020-01-23 LAB — PHOSPHORUS: Phosphorus: 4.5 mg/dL (ref 2.5–4.6)

## 2020-01-23 MED ORDER — WARFARIN SODIUM 2 MG PO TABS
4.0000 mg | ORAL_TABLET | Freq: Once | ORAL | Status: AC
  Administered 2020-01-23: 17:00:00 4 mg via ORAL
  Filled 2020-01-23: qty 2

## 2020-01-23 MED ORDER — SODIUM CHLORIDE 0.9 % IV SOLN
250.0000 mg | Freq: Every day | INTRAVENOUS | Status: AC
  Administered 2020-01-23 – 2020-01-24 (×2): 250 mg via INTRAVENOUS
  Filled 2020-01-23 (×2): qty 20

## 2020-01-23 MED ORDER — FUROSEMIDE 80 MG PO TABS: 80.0000 mg | ORAL_TABLET | Freq: Two times a day (BID) | ORAL | Status: DC

## 2020-01-23 MED ORDER — FUROSEMIDE 80 MG PO TABS
80.0000 mg | ORAL_TABLET | Freq: Two times a day (BID) | ORAL | Status: DC
  Administered 2020-01-23 – 2020-01-24 (×3): 80 mg via ORAL
  Filled 2020-01-23 (×3): qty 1

## 2020-01-23 MED ORDER — MAGNESIUM SULFATE IN D5W 1-5 GM/100ML-% IV SOLN
1.0000 g | Freq: Once | INTRAVENOUS | Status: AC
  Administered 2020-01-23: 1 g via INTRAVENOUS
  Filled 2020-01-23: qty 100

## 2020-01-23 NOTE — Progress Notes (Signed)
Physical Therapy Treatment Patient Details Name: Kyle [name] MRN: [id number] DOB: 1951-09-05 Today's Date: 01/23/2020    History of Present Illness Pt adm 3/27 with acute on chronic respiratory failure and heart failure. Pt with recent admission for acute renal and respiratory failure. PMH - cardiomyopathy, copd on 5L, PAD, HTN, DM    PT Comments    Pt was in his room on 3L O2, but cannot maintain O2 sats on this level to ambulate.  He is down to 77% quickly, then 82% and titrated up to 5L to get his sats in a 90% range.  At rest immediately after was 96% on his baseline O2.  Follow up with pt to get more strengthening done, but had to stop for his IV team meeting to put in a new line.     Follow Up Recommendations  No PT follow up;Supervision - Intermittent     Equipment Recommendations  None recommended by PT    Recommendations for Other Services       Precautions / Restrictions Precautions Precaution Comments: watch SpO2 Restrictions Weight Bearing Restrictions: No    Mobility  Bed Mobility Overal bed mobility: Modified Independent                Transfers Overall transfer level: Needs assistance Equipment used: Rolling walker (2 wheeled) Transfers: Sit to/from Stand Sit to Stand: Min guard         General transfer comment: tends to want to put hands on the walker  Ambulation/Gait Ambulation/Gait assistance: Min guard Gait Distance (Feet): 250 Feet Assistive device: Rolling walker (2 wheeled) Gait Pattern/deviations: Step-through pattern;Decreased stride length Gait velocity: decreased   General Gait Details: has 3L O2 on in room but cannot maintain sats on 3L   Stairs             Wheelchair Mobility    Modified Rankin (Stroke Patients Only)       Balance Overall balance assessment: Needs assistance Sitting-balance support: Feet supported Sitting balance-Leahy Scale: Good     Standing balance support: Bilateral upper extremity  supported;During functional activity Standing balance-Leahy Scale: Good Standing balance comment: fair with no AD but requires supervision as he will take off without walker                            Cognition Arousal/Alertness: Awake/alert Behavior During Therapy: WFL for tasks assessed/performed Overall Cognitive Status: Within Functional Limits for tasks assessed                                        Exercises      General Comments General comments (skin integrity, edema, etc.): pt is getting up to walk with RW and cues for safety, tends to turn quickly and move with faster pace.  Requires reminders for control of walker       Pertinent Vitals/Pain Pain Assessment: No/denies pain    Home Living                      Prior Function            PT Goals (current goals can now be found in the care plan section) Acute Rehab PT Goals Patient Stated Goal: go home Progress towards PT goals: Progressing toward goals    Frequency    Min 3X/week  PT Plan Current plan remains appropriate    Co-evaluation              AM-PAC PT "6 Clicks" Mobility   Outcome Measure  Help needed turning from your back to your side while in a flat bed without using bedrails?: None Help needed moving from lying on your back to sitting on the side of a flat bed without using bedrails?: None Help needed moving to and from a bed to a chair (including a wheelchair)?: None Help needed standing up from a chair using your arms (e.g., wheelchair or bedside chair)?: A Little Help needed to walk in hospital room?: A Little Help needed climbing 3-5 steps with a railing? : A Little 6 Click Score: 21    End of Session Equipment Utilized During Treatment: Oxygen Activity Tolerance: Patient tolerated treatment well Patient left: in bed;with call bell/phone within reach;with nursing/sitter in room Nurse Communication: Mobility status PT Visit Diagnosis:  Unsteadiness on feet (R26.81);Muscle weakness (generalized) (M62.81);Difficulty in walking, not elsewhere classified (R26.2)     Time: 3888-7579 PT Time Calculation (min) (ACUTE ONLY): 29 min  Charges:  $Gait Training: 8-22 mins $Therapeutic Activity: 8-22 mins                    Ramond Dial 01/23/2020, 3:11 PM  Mee Hives, PT MS Acute Rehab Dept. Number: [employer] and [place]

## 2020-01-23 NOTE — Progress Notes (Addendum)
PROGRESS NOTE    Kyle [name]  RCV:[id number] DOB: December 16, 1950 DOA: 01/20/2020 PCP: Alonna Buckler, MD   Brief Narrative:  Kyle [name] is Kyle [name] 69 y.o. 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  He's been admitted for Kyle [name] heart failure exacerbation.  His lasix was held at discharge from his previous hospitalization.  He's improved, but continues to require more supplemental O2 than baseline.  Renal following and has transitioned to PO lasix today.  CXR pending at this time.  Could consider d/c tomorrow if he does well on oral lasix, able to maintain his saturations on home O2.  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 Baseline creatinine 1.57 (2/21) -> last admission peaked at 14.97, required dialysis -> Creatinine relatively stable over past day -> 2.69 today Continue to hold quinapril Follow with diuresis  Will ask for nephrology's assistance with diuresis, appreciate assistance  Atrial Fibrillation: continue warfarin -> if creatinine continues to improve, consider transition back to eliquis Subtherapeutic INR, phramacy to dose warfarin  Hypertension: coreg, lasix - ace on hold  PAD: s/p redo right fem pop bypass.  Needs f/u with vascular.  Type 2 Diabetes: A1c 8.0.  Lantus was d/c'd due to fastings <100.  Follow with SSI.  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   Start iron  Hypomagnesemia: replace, follow  Concern for seizures: episode during last hospitalization where he became nonverbal and flaccid in bilateral upper extremities.  No seizures on spot EEG and LTM.  Concern for possible seizure at that time, recommended keppra 500 mg BID and seizure precautions. Needs outpatient neurology follow up (see neuro note from 12/28/19)  DVT prophylaxis: warfarin Code Status: full Family Communication: wife over phone 3/29 Disposition Plan:  . Patient came from: home            . Anticipated d/c place: . Barriers to d/c OR conditions which need to be met to effect [name] safe d/c: persistent pulm edema, needs additional IV diuresis, transition to inpatient status  Consultants:  none  Procedures:   none  Antimicrobials:  Anti-infectives (From admission, onward)   None     Subjective: No new complaints Feeling better  Objective: Vitals:   01/22/20 1642 01/22/20 2037 01/23/20 0347 01/23/20 0850  BP: 125/71 124/68 116/69   Pulse: 87 87 95   Resp: '15 19 19   '$ Temp:  98.1 F (36.7 C) (!) 97.5 F (36.4 C) (!) 97.5 F (36.4 C)   TempSrc: Oral Oral Oral   SpO2: 93% 96% 98% 91%  Weight:   65.3 kg   Height:        Intake/Output Summary (Last 24 hours) at 01/23/2020 1151 Last data filed at 01/23/2020 0900 Gross per 24 hour  Intake 720 ml  Output 2201 ml  Net -1481 ml   Filed Weights   01/21/20 0029 01/22/20 0424 01/23/20 0347  Weight: 67.2 kg 65.9 kg 65.3 kg    Examination:  General: No acute distress. Cardiovascular: Heart sounds show Kyle [name] regular rate Lungs: bibasilar crackles Abdomen: Soft, nontender, nondistended  Neurological: Alert and oriented 3. Moves all extremities 4. Cranial nerves II through XII grossly intact. Skin: Warm and dry. No rashes or lesions. Extremities: No clubbing or cyanosis. No edema.   Data Reviewed: I have personally reviewed following labs and imaging studies  CBC: Recent Labs  Lab 01/20/20 1439 01/20/20 1527 01/21/20 0737 01/22/20 0831 01/23/20 0451  WBC 11.1*  --  10.1 8.1 7.8  NEUTROABS  --   --   --  5.5 4.6  HGB 9.3* 8.8* 8.9* 9.4* 9.6*  HCT 31.0* 26.0* 29.0* 30.3* 31.1*  MCV 96.0  --  92.4 91.3 92.6  PLT 351  --  344 370 267   Basic Metabolic Panel: Recent Labs  Lab 01/20/20 1439 01/20/20 1527 01/21/20 0914 01/22/20 0831 01/23/20 0451  NA 144 143 144 140 140  K 4.2 4.1 4.2 4.0 4.0  CL 110  --  107 101 100  CO2 23  --  '27 27 25  '$ GLUCOSE 250*  --  155* 164* 114*  BUN 25*  --  24* 30* 35*  CREATININE 2.59*  --  2.45* 2.72* 2.69*  CALCIUM 9.0  --  9.0 9.1 9.0  MG  --   --   --   --  1.6*  PHOS  --   --   --   --  4.5   GFR: Estimated Creatinine Clearance: 24.6 mL/min (Kyle [name]) (by C-G formula based on SCr of 2.69 mg/dL (H)). Liver Function Tests: Recent Labs  Lab 01/20/20 1439 01/21/20 0914 01/22/20 0831 01/23/20 0451  AST '19 19 18 17  '$ ALT '20 21 20 19  '$ ALKPHOS 84 72 71 82  BILITOT 0.8 0.9 0.9 0.8  PROT 6.8 6.4* 6.8 7.2  ALBUMIN 2.6* 2.3* 2.5* 2.6*   No results for input(s):  LIPASE, AMYLASE in the last 168 hours. No results for input(s): AMMONIA in the last 168 hours. Coagulation Profile: Recent Labs  Lab 01/20/20 1439 01/21/20 0737 01/22/20 0511 01/23/20 0451  INR 1.7* 1.7* 1.7* 1.9*   Cardiac Enzymes: No results for input(s): CKTOTAL, CKMB, CKMBINDEX, TROPONINI in the last 168 hours. BNP (last 3 results) No results for input(s): PROBNP in the last 8760 hours. HbA1C: No results for input(s): HGBA1C in the last 72 hours. CBG: Recent Labs  Lab 01/22/20 1640 01/22/20 2035 01/23/20 0039 01/23/20 0412 01/23/20 0720  GLUCAP 120* 192* 101* 117* 141*   Lipid Profile: No results for input(s): CHOL, HDL, LDLCALC, TRIG, CHOLHDL, LDLDIRECT  in the last 72 hours. Thyroid Function Tests: No results for input(s): TSH, T4TOTAL, FREET4, T3FREE, THYROIDAB in the last 72 hours. Anemia Panel: Recent Labs    01/20/20 1439 01/22/20 1647  VITAMINB12 429  --   FOLATE 38.6  --   FERRITIN 437*  --   TIBC 193* 204*  IRON 24* 28*   Sepsis Labs: No results for input(s): PROCALCITON, LATICACIDVEN in the last 168 hours.  Recent Results (from the past 240 hour(s))  SARS CORONAVIRUS 2 (TAT 6-24 HRS) Nasopharyngeal Nasopharyngeal Swab     Status: None   Collection Time: 01/20/20  2:10 PM   Specimen: Nasopharyngeal Swab  Result Value Ref Range Status   SARS Coronavirus 2 NEGATIVE NEGATIVE Final    Comment: (NOTE) SARS-CoV-2 target nucleic acids are NOT DETECTED. The SARS-CoV-2 RNA is generally detectable in upper and lower respiratory specimens during the acute phase of infection. Negative results do not preclude SARS-CoV-2 infection, do not rule out co-infections with other pathogens, and should not be used as the sole basis for treatment or other patient management decisions. Negative results must be combined with clinical observations, patient history, and epidemiological information. The expected result is Negative. Fact Sheet for  Patients: SugarRoll.be Fact Sheet for Healthcare Providers: https://www.woods-mathews.com/ This test is not yet approved or cleared by the [place] FDA and  has been authorized for detection and/or diagnosis of SARS-CoV-2 by FDA under an Emergency Use Authorization (EUA). This EUA will remain  in effect (meaning this test can be used) for the duration of the COVID-19 declaration under Section 56 4(b)(1) of the Act, 21 U.S.C. section 360bbb-3(b)(1), unless the authorization is terminated or revoked sooner. Performed at [facility], [place] [address], [place], [place] [address]          Radiology Studies: DG CHEST PORT 1 VIEW  Result Date: 01/22/2020 CLINICAL DATA:  69 year old male with history of hypoxia. History of pulmonary edema. EXAM: PORTABLE CHEST 1 VIEW COMPARISON:  Chest x-ray 01/21/2020. FINDINGS: There is cephalization of the pulmonary vasculature and slight indistinctness of the interstitial markings suggestive of mild pulmonary edema. No definite pleural effusions. Mild cardiomegaly. Upper mediastinal contours are within normal limits. IMPRESSION: 1. Slight worsening of congestive heart failure, as above. Electronically Signed   By: Vinnie Langton M.D.   On: 01/22/2020 12:16        Scheduled Meds: . ferrous sulfate  325 mg Oral Q breakfast  . furosemide  80 mg Oral BID  . insulin aspart  0-9 Units Subcutaneous Q4H  . levETIRAcetam  500 mg Oral BID  . rosuvastatin  10 mg Oral q1800  . umeclidinium-vilanterol  1 puff Inhalation Daily  . warfarin  4 mg Oral ONCE-1600  . Warfarin - Pharmacist Dosing Inpatient   Does not apply q1800   Continuous Infusions: . ferric gluconate (FERRLECIT/NULECIT) IV    . magnesium sulfate bolus IVPB       LOS: 2 days    Time spent: over 30 min    Fayrene Helper, MD [facility]   To contact the attending provider between 7A-7P or the covering provider during after  hours 7P-7A, please log into the web site [email or web address] and access using universal [place] password for that web site. If you do not have the password, please call the hospital operator.  01/23/2020, 11:51 AM

## 2020-01-23 NOTE — Consult Note (Signed)
[facility] Renal Consultation Note  Requesting MD:  Indication for Consultation: Acute on chronic kidney disease, maintenance of euvolemia, assessment and treatment of electrolyte abnormalities, assessment and treatment of acid-base abnormalities  HPI:  Kyle [name] is a 69 y.o. male.  Medical history of nonischemic cardiomyopathy COPD on 5 L of oxygen peripheral artery disease hypertension diabetes mellitus type 2.  Patient was admitted with increasing shortness of breath.  Chest x-ray was consistent with mild congestive heart failure.  His echocardiogram from 12/13/2019 showed an ejection fraction of 65-70% with concentric LVH and diastolic dysfunction.  He was recently seen by nephrology 12/24/2019.  This was for acute kidney injury in the setting of ACE inhibitor use.  Baseline serum creatinine appears but 1.5.  He required dialysis as needed during hospitalization.  His last dialysis treatment appeared to be 01/11/2020.  Urine output 2.5 L 01/22/2020  Blood pressure 116/69 pulse 87 temperature 97.5 O2 sats 90% nasal cannula  Sodium 140 potassium 4.0 chloride 100 CO2 25 BUN 35 creatinine 2.69 glucose 114 calcium 9 phosphorus 4.5 magnesium 1.6 albumin 2.6 iron saturations 14% hemoglobin 9.6.     Current medications amlodipine 5 mg daily Coreg 6.25 mg twice daily Lasix 60 mg IV   Crestor 10 mg daily warfarin 4 mg per pharmacy Keppra 500 mg twice daily.    Creat  Date/Time Value Ref Range Status  11/12/2014 01:44 PM 0.99 0.50 - 1.35 mg/dL Final  11/09/2014 01:33 PM 0.92 0.50 - 1.35 mg/dL Final  08/20/2014 11:13 AM 0.94 0.50 - 1.35 mg/dL Final   Creatinine, Ser  Date/Time Value Ref Range Status  01/23/2020 04:51 AM 2.69 (H) 0.61 - 1.24 mg/dL Final  01/22/2020 08:31 AM 2.72 (H) 0.61 - 1.24 mg/dL Final  01/21/2020 09:14 AM 2.45 (H) 0.61 - 1.24 mg/dL Final  01/20/2020 02:39 PM 2.59 (H) 0.61 - 1.24 mg/dL Final  01/11/2020 03:35 AM 6.09 (H) 0.61 - 1.24 mg/dL Final   01/10/2020 06:20 AM 6.77 (H) 0.61 - 1.24 mg/dL Final  01/09/2020 03:34 AM 7.28 (H) 0.61 - 1.24 mg/dL Final  01/08/2020 10:12 AM 7.10 (H) 0.61 - 1.24 mg/dL Final  01/07/2020 04:10 AM 6.09 (H) 0.61 - 1.24 mg/dL Final    Comment:    DELTA CHECK NOTED  01/06/2020 05:17 AM 10.10 (H) 0.61 - 1.24 mg/dL Final  01/05/2020 07:15 AM 9.66 (H) 0.61 - 1.24 mg/dL Final  01/04/2020 04:38 AM 8.39 (H) 0.61 - 1.24 mg/dL Final  01/03/2020 04:31 AM 6.75 (H) 0.61 - 1.24 mg/dL Final  01/02/2020 05:18 AM 5.37 (H) 0.61 - 1.24 mg/dL Final    Comment:    DIALYSIS  01/01/2020 04:20 AM 8.56 (H) 0.61 - 1.24 mg/dL Final  12/31/2019 06:00 AM 6.63 (H) 0.61 - 1.24 mg/dL Final  12/30/2019 04:17 AM 4.97 (H) 0.61 - 1.24 mg/dL Final    Comment:    DELTA CHECK NOTED  12/29/2019 05:15 AM 7.78 (H) 0.61 - 1.24 mg/dL Final  12/28/2019 11:13 AM 6.85 (H) 0.61 - 1.24 mg/dL Final  12/27/2019 03:44 AM 8.50 (H) 0.61 - 1.24 mg/dL Final    Comment:    DELTA CHECK NOTED  12/26/2019 05:00 PM 4.24 (H) 0.61 - 1.24 mg/dL Final    Comment:    DIALYSIS  12/26/2019 06:29 AM 10.70 (H) 0.61 - 1.24 mg/dL Final  12/25/2019 04:25 AM 8.87 (H) 0.61 - 1.24 mg/dL Final    Comment:    DELTA CHECK NOTED  12/24/2019 08:35 PM 13.31 (H) 0.61 - 1.24 mg/dL Final  12/24/2019 02:45 PM 14.23 (H) 0.61 - 1.24 mg/dL Final  12/24/2019 10:05 AM 14.45 (H) 0.61 - 1.24 mg/dL Final  12/24/2019 04:19 AM 14.97 (H) 0.61 - 1.24 mg/dL Final  11/30/2019 09:37 AM 1.57 (H) 0.76 - 1.27 mg/dL Final  11/21/2019 09:37 AM 1.44 (H) 0.76 - 1.27 mg/dL Final  04/25/2019 04:13 AM 1.49 (H) 0.61 - 1.24 mg/dL Final  04/24/2019 07:04 AM 1.44 (H) 0.61 - 1.24 mg/dL Final  04/23/2019 02:22 AM 1.75 (H) 0.61 - 1.24 mg/dL Final  04/21/2019 06:31 AM 1.40 (H) 0.61 - 1.24 mg/dL Final  11/30/2018 02:40 AM 1.03 0.61 - 1.24 mg/dL Final  11/29/2018 03:09 AM 1.21 0.61 - 1.24 mg/dL Final  11/28/2018 03:36 AM 1.18 0.61 - 1.24 mg/dL Final  11/27/2018 03:40 AM 1.07 0.61 - 1.24 mg/dL Final   11/26/2018 03:55 AM 0.94 0.61 - 1.24 mg/dL Final  11/25/2018 07:41 AM 0.96 0.61 - 1.24 mg/dL Final  11/24/2018 03:14 AM 0.98 0.61 - 1.24 mg/dL Final  11/23/2018 03:28 AM 1.16 0.61 - 1.24 mg/dL Final  11/22/2018 02:10 AM 1.91 (H) 0.61 - 1.24 mg/dL Final  11/21/2018 02:20 AM 2.37 (H) 0.61 - 1.24 mg/dL Final  11/20/2018 11:41 PM 2.54 (H) 0.61 - 1.24 mg/dL Final  11/20/2018 07:27 PM 2.73 (H) 0.61 - 1.24 mg/dL Final  10/16/2016 07:35 AM 1.14 0.61 - 1.24 mg/dL Final  12/24/2014 04:36 AM 0.76 0.50 - 1.35 mg/dL Final  12/23/2014 04:35 AM 0.81 0.50 - 1.35 mg/dL Final  12/22/2014 04:09 AM 0.77 0.50 - 1.35 mg/dL Final  12/21/2014 06:45 AM 0.91 0.50 - 1.35 mg/dL Final  12/19/2014 04:35 AM 0.94 0.50 - 1.35 mg/dL Final  12/18/2014 01:20 PM 0.88 0.50 - 1.35 mg/dL Final     PMHx:   Past Medical History:  Diagnosis Date  . Aortic insufficiency    MODERATE WITH A BICUSPID AORTIC VAVLE  . Arterial occlusive disease    MULTILEVEL  . CHF (congestive heart failure) ([place])   . COPD (chronic obstructive pulmonary disease) ([place])   . Dilated cardiomyopathy (HCC)    WITH EJECTION FRACTION DOWN TO 20-25%--WITH CONGESTIVE HEART FAILURE  . Edema    LOWER EXTREMETIES  . Hypertension   . Normal coronary arteries 2009  . Orthopnea   . Peripheral arterial disease ([place])   . Pulmonary hypertension ([place])   . SOB (shortness of breath)     Past Surgical History:  Procedure Laterality Date  . ABDOMINAL AORTAGRAM N/A 11/05/2014   Procedure: ABDOMINAL Maxcine Ham;  Surgeon: Angelia Mould, MD;  Location: [facility] CATH LAB;  Service: Cardiovascular;  Laterality: N/A;  . ABDOMINAL AORTOGRAM W/LOWER EXTREMITY Bilateral 04/21/2019   Procedure: ABDOMINAL AORTOGRAM W/LOWER EXTREMITY;  Surgeon: Angelia Mould, MD;  Location: [place] CV LAB;  Service: Cardiovascular;  Laterality: Bilateral;  . AORTA - BILATERAL FEMORAL ARTERY BYPASS GRAFT N/A 12/18/2014   Procedure: AORTOBIFEMORAL BYPASS GRAFT;  Surgeon:  Angelia Mould, MD;  Location: [place];  Service: Vascular;  Laterality: N/A;  . CARDIAC CATHETERIZATION  2009   Nl Cors, EF 20%  . COLONOSCOPY    . ENDARTERECTOMY FEMORAL Left 12/18/2014   Procedure: ENDARTERECTOMY FEMORAL;  Surgeon: Angelia Mould, MD;  Location: [place];  Service: Vascular;  Laterality: Left;  . FALSE ANEURYSM REPAIR Right 04/24/2019   Procedure: REPAIR OF RIGHT FEMORAL ARTERY PSEUDOANEURYSM;  Surgeon: Rosetta Posner, MD;  Location: [place];  Service: Vascular;  Laterality: Right;  . FEMORAL-POPLITEAL BYPASS GRAFT  02/21/2011   right  . FEMORAL-TIBIAL BYPASS GRAFT  Right 04/24/2019   Procedure: REDO BYPASS GRAFT RIGHT FEMORAL-TIBIAL ARTERY USING LEFT LEG VEIN & HEMASHIELD GOLD 35mm GRAFT;  Surgeon: Rosetta Posner, MD;  Location: [place];  Service: Vascular;  Laterality: Right;  . IR FLUORO GUIDE CV LINE RIGHT  12/28/2019  . IR REMOVAL TUN CV CATH W/O FL  01/11/2020  . IR US GUIDE VASC ACCESS RIGHT  12/28/2019  . OTHER SURGICAL HISTORY     POST RIGHT FEMOROPOPLITEAL BYPASS GRAFT  . PERIPHERAL VASCULAR CATHETERIZATION  11/05/2014   Procedure: LOWER EXTREMITY ANGIOGRAPHY;  Surgeon: Angelia Mould, MD;  Location: [facility] CATH LAB;  Service: Cardiovascular;;  . VEIN HARVEST Left 04/24/2019   Procedure: LEFT LEG SAPHENOUS VEIN HARVEST;  Surgeon: Rosetta Posner, MD;  Location: [facility] OR;  Service: Vascular;  Laterality: Left;    Family Hx:  Family History  Problem Relation Age of Onset  . Diabetes Mother   . Hyperlipidemia Sister   . Hypertension Sister     Social History:  reports that he quit smoking about 15 months ago. He has a 40.00 pack-year smoking history. He has never used smokeless tobacco. He reports that he does not drink alcohol or use drugs.  Allergies: No Known Allergies  Medications: Prior to Admission medications   Medication Sig Start Date End Date Taking? Authorizing Provider  ACCU-CHEK FASTCLIX LANCETS [place]  07/30/18  Yes [provider]  Accu-Chek  FastClix Lancets MISC USE TO MONITOR BLOOD GLUCOSE 2 TIMES DAILY 11/06/19  Yes [provider]  ACCU-CHEK GUIDE test strip  11/30/18  Yes [provider]  amLODipine (NORVASC) 5 MG tablet Take 1 tablet (5 mg total) by mouth daily. 01/05/19  Yes Martinique, Peter M, MD  BD PEN NEEDLE NANO U/F 32G X 4 MM MISC  08/06/18  Yes [provider]  carvedilol (COREG) 6.25 MG tablet Take 1 tablet (6.25 mg total) by mouth 2 (two) times daily with a meal. 01/11/20 03/11/20 Yes Kc, Ramesh, MD  glucose blood (ACCU-CHEK GUIDE) test strip one strip (1 each dose) by Other route 2 (two) times daily. Test blood glucose 2 times a day. 11/21/19  Yes [provider]  guaiFENesin (MUCINEX) 600 MG 12 hr tablet Take 2 tablets (1,200 mg total) by mouth 2 (two) times daily. Patient taking differently: Take 600 mg by mouth 2 (two) times daily.  11/30/18  Yes Lavina Hamman, MD  insulin glargine (LANTUS) 100 UNIT/ML injection Inject 0.06 mLs (6 Units total) into the skin at bedtime. 01/11/20  Yes Kc, Maren Beach, MD  Insulin Pen Needle (BD PEN NEEDLE NANO U/F) 32G X 4 MM MISC USE TO INJECT INTO THE SKIN DAILY. 11/06/19  Yes [provider]  levETIRAcetam (KEPPRA) 500 MG tablet Take 1 tablet (500 mg total) by mouth 2 (two) times daily. 01/11/20 02/10/20 Yes Antonieta Pert, MD  Multiple Vitamin (MULTIVITAMIN WITH MINERALS) TABS tablet Take 1 tablet by mouth daily.   Yes [provider]  Nutritional Supplements (FEEDING SUPPLEMENT, NEPRO CARB STEADY,) LIQD Take 237 mLs by mouth 3 (three) times daily between meals for 15 days. 01/11/20 01/26/20 Yes Antonieta Pert, MD  OXYGEN Inhale 4.5 L into the lungs continuous.   Yes [provider]  rosuvastatin (CRESTOR) 40 MG tablet Take 1 tablet (40 mg total) by mouth daily. 12/01/19 02/29/20 Yes Martinique, Peter M, MD  umeclidinium-vilanterol [facility] ELLIPTA) 62.5-25 MCG/INH AEPB Inhale 1 puff into the lungs daily. 08/07/19  Yes Lauraine Rinne, NP  warfarin (COUMADIN) 2  MG tablet  Take 1 tablet (2 mg total) by mouth daily at 6 PM. 01/11/20 02/10/20 Yes Kc, Ramesh, MD  ANORO ELLIPTA 62.5-25 MCG/INH AEPB INHALE 1 PUFF BY MOUTH ONCE DAILY . APPOINTMENT REQUIRED FOR FUTURE REFILLS Patient not taking: No sig reported 07/31/19   Rigoberto Noel, MD  furosemide (LASIX) 20 MG tablet Take 1 tablet (20 mg total) by mouth 2 (two) times daily. 01/20/20   Pattricia Boss, MD     Labs:  Results for orders placed or performed during the hospital encounter of 01/20/20 (from the past 48 hour(s))  Comprehensive metabolic panel     Status: Abnormal   Collection Time: 01/21/20  9:14 AM  Result Value Ref Range   Sodium 144 135 - 145 mmol/L   Potassium 4.2 3.5 - 5.1 mmol/L   Chloride 107 98 - 111 mmol/L   CO2 27 22 - 32 mmol/L   Glucose, Bld 155 (H) 70 - 99 mg/dL    Comment: Glucose reference range applies only to samples taken after fasting for at least 8 hours.   BUN 24 (H) 8 - 23 mg/dL   Creatinine, Ser 2.45 (H) 0.61 - 1.24 mg/dL   Calcium 9.0 8.9 - 10.3 mg/dL   Total Protein 6.4 (L) 6.5 - 8.1 g/dL   Albumin 2.3 (L) 3.5 - 5.0 g/dL   AST 19 15 - 41 U/L   ALT 21 0 - 44 U/L   Alkaline Phosphatase 72 38 - 126 U/L   Total Bilirubin 0.9 0.3 - 1.2 mg/dL   GFR calc non Af Amer 26 (L) >60 mL/min   GFR calc Af Amer 30 (L) >60 mL/min   Anion gap 10 5 - 15    Comment: Performed at [facility] [address], [place], [place] [address]  Glucose, capillary     Status: Abnormal   Collection Time: 01/21/20 12:09 PM  Result Value Ref Range   Glucose-Capillary 113 (H) 70 - 99 mg/dL    Comment: Glucose reference range applies only to samples taken after fasting for at least 8 hours.  Glucose, capillary     Status: Abnormal   Collection Time: 01/21/20  4:15 PM  Result Value Ref Range   Glucose-Capillary 138 (H) 70 - 99 mg/dL    Comment: Glucose reference range applies only to samples taken after fasting for at least 8 hours.  Glucose, capillary     Status: Abnormal   Collection  Time: 01/21/20  8:11 PM  Result Value Ref Range   Glucose-Capillary 189 (H) 70 - 99 mg/dL    Comment: Glucose reference range applies only to samples taken after fasting for at least 8 hours.  Glucose, capillary     Status: Abnormal   Collection Time: 01/22/20 12:31 AM  Result Value Ref Range   Glucose-Capillary 107 (H) 70 - 99 mg/dL    Comment: Glucose reference range applies only to samples taken after fasting for at least 8 hours.  Glucose, capillary     Status: Abnormal   Collection Time: 01/22/20  4:24 AM  Result Value Ref Range   Glucose-Capillary 133 (H) 70 - 99 mg/dL    Comment: Glucose reference range applies only to samples taken after fasting for at least 8 hours.  Protime-INR     Status: Abnormal   Collection Time: 01/22/20  5:11 AM  Result Value Ref Range   Prothrombin Time 19.7 (H) 11.4 - 15.2 seconds   INR 1.7 (H) 0.8 - 1.2    Comment: (NOTE)  INR goal varies based on device and disease states. Performed at [facility], [place] [address] [place], [place] [address]   Glucose, capillary     Status: Abnormal   Collection Time: 01/22/20  8:29 AM  Result Value Ref Range   Glucose-Capillary 150 (H) 70 - 99 mg/dL    Comment: Glucose reference range applies only to samples taken after fasting for at least 8 hours.  CBC with Differential/Platelet     Status: Abnormal   Collection Time: 01/22/20  8:31 AM  Result Value Ref Range   WBC 8.1 4.0 - 10.5 K/uL   RBC 3.32 (L) 4.22 - 5.81 MIL/uL   Hemoglobin 9.4 (L) 13.0 - 17.0 g/dL   HCT 30.3 (L) 39.0 - 52.0 %   MCV 91.3 80.0 - 100.0 fL   MCH 28.3 26.0 - 34.0 pg   MCHC 31.0 30.0 - 36.0 g/dL   RDW 17.6 (H) 11.5 - 15.5 %   Platelets 370 150 - 400 K/uL   nRBC 0.4 (H) 0.0 - 0.2 %   Neutrophils Relative % 67 %   Neutro Abs 5.5 1.7 - 7.7 K/uL   Lymphocytes Relative 18 %   Lymphs Abs 1.5 0.7 - 4.0 K/uL   Monocytes Relative 11 %   Monocytes Absolute 0.9 0.1 - 1.0 K/uL   Eosinophils Relative 2 %   Eosinophils Absolute 0.2 0.0  - 0.5 K/uL   Basophils Relative 1 %   Basophils Absolute 0.0 0.0 - 0.1 K/uL   Immature Granulocytes 1 %   Abs Immature Granulocytes 0.07 0.00 - 0.07 K/uL    Comment: Performed at [facility], [address] [place], [place] [address]  Comprehensive metabolic panel     Status: Abnormal   Collection Time: 01/22/20  8:31 AM  Result Value Ref Range   Sodium 140 135 - 145 mmol/L   Potassium 4.0 3.5 - 5.1 mmol/L   Chloride 101 98 - 111 mmol/L   CO2 27 22 - 32 mmol/L   Glucose, Bld 164 (H) 70 - 99 mg/dL    Comment: Glucose reference range applies only to samples taken after fasting for at least 8 hours.   BUN 30 (H) 8 - 23 mg/dL   Creatinine, Ser 2.72 (H) 0.61 - 1.24 mg/dL   Calcium 9.1 8.9 - 10.3 mg/dL   Total Protein 6.8 6.5 - 8.1 g/dL   Albumin 2.5 (L) 3.5 - 5.0 g/dL   AST 18 15 - 41 U/L   ALT 20 0 - 44 U/L   Alkaline Phosphatase 71 38 - 126 U/L   Total Bilirubin 0.9 0.3 - 1.2 mg/dL   GFR calc non Af Amer 23 (L) >60 mL/min   GFR calc Af Amer 27 (L) >60 mL/min   Anion gap 12 5 - 15    Comment: Performed at [facility] [address], [place], [place] [address]  Glucose, capillary     Status: None   Collection Time: 01/22/20 12:35 PM  Result Value Ref Range   Glucose-Capillary 76 70 - 99 mg/dL    Comment: Glucose reference range applies only to samples taken after fasting for at least 8 hours.  Glucose, capillary     Status: Abnormal   Collection Time: 01/22/20  4:40 PM  Result Value Ref Range   Glucose-Capillary 120 (H) 70 - 99 mg/dL    Comment: Glucose reference range applies only to samples taken after fasting for at least 8 hours.  Iron  and TIBC     Status: Abnormal   Collection Time: 01/22/20  4:47 PM  Result Value Ref Range   Iron 28 (L) 45 - 182 ug/dL   TIBC 204 (L) 250 - 450 ug/dL   Saturation Ratios 14 (L) 17.9 - 39.5 %   UIBC 176 ug/dL    Comment: Performed at [facility] [address], [place], [place] [address]  Glucose, capillary      Status: Abnormal   Collection Time: 01/22/20  8:35 PM  Result Value Ref Range   Glucose-Capillary 192 (H) 70 - 99 mg/dL    Comment: Glucose reference range applies only to samples taken after fasting for at least 8 hours.  Glucose, capillary     Status: Abnormal   Collection Time: 01/23/20 12:39 AM  Result Value Ref Range   Glucose-Capillary 101 (H) 70 - 99 mg/dL    Comment: Glucose reference range applies only to samples taken after fasting for at least 8 hours.  Glucose, capillary     Status: Abnormal   Collection Time: 01/23/20  4:12 AM  Result Value Ref Range   Glucose-Capillary 117 (H) 70 - 99 mg/dL    Comment: Glucose reference range applies only to samples taken after fasting for at least 8 hours.  Protime-INR     Status: Abnormal   Collection Time: 01/23/20  4:51 AM  Result Value Ref Range   Prothrombin Time 21.5 (H) 11.4 - 15.2 seconds   INR 1.9 (H) 0.8 - 1.2    Comment: (NOTE) INR goal varies based on device and disease states. Performed at [facility], [place] [address], [place], [place] [address]   CBC with Differential/Platelet     Status: Abnormal   Collection Time: 01/23/20  4:51 AM  Result Value Ref Range   WBC 7.8 4.0 - 10.5 K/uL   RBC 3.36 (L) 4.22 - 5.81 MIL/uL   Hemoglobin 9.6 (L) 13.0 - 17.0 g/dL   HCT 31.1 (L) 39.0 - 52.0 %   MCV 92.6 80.0 - 100.0 fL   MCH 28.6 26.0 - 34.0 pg   MCHC 30.9 30.0 - 36.0 g/dL   RDW 17.4 (H) 11.5 - 15.5 %   Platelets 400 150 - 400 K/uL   nRBC 0.0 0.0 - 0.2 %   Neutrophils Relative % 60 %   Neutro Abs 4.6 1.7 - 7.7 K/uL   Lymphocytes Relative 22 %   Lymphs Abs 1.7 0.7 - 4.0 K/uL   Monocytes Relative 14 %   Monocytes Absolute 1.1 (H) 0.1 - 1.0 K/uL   Eosinophils Relative 2 %   Eosinophils Absolute 0.2 0.0 - 0.5 K/uL   Basophils Relative 1 %   Basophils Absolute 0.0 0.0 - 0.1 K/uL   Immature Granulocytes 1 %   Abs Immature Granulocytes 0.11 (H) 0.00 - 0.07 K/uL    Comment: Performed at [facility] [address], [place], [place] [address]  Comprehensive metabolic panel     Status: Abnormal   Collection Time: 01/23/20  4:51 AM  Result Value Ref Range   Sodium 140 135 - 145 mmol/L   Potassium 4.0 3.5 - 5.1 mmol/L   Chloride 100 98 - 111 mmol/L   CO2 25 22 - 32 mmol/L   Glucose, Bld 114 (H) 70 - 99 mg/dL    Comment: Glucose reference range applies only to samples taken after fasting for at least 8 hours.   BUN 35 (H) 8 - 23 mg/dL  Creatinine, Ser 2.69 (H) 0.61 - 1.24 mg/dL   Calcium 9.0 8.9 - 10.3 mg/dL   Total Protein 7.2 6.5 - 8.1 g/dL   Albumin 2.6 (L) 3.5 - 5.0 g/dL   AST 17 15 - 41 U/L   ALT 19 0 - 44 U/L   Alkaline Phosphatase 82 38 - 126 U/L   Total Bilirubin 0.8 0.3 - 1.2 mg/dL   GFR calc non Af Amer 23 (L) >60 mL/min   GFR calc Af Amer 27 (L) >60 mL/min   Anion gap 15 5 - 15    Comment: Performed at [facility] [address], [place], [place] [address]  Magnesium     Status: Abnormal   Collection Time: 01/23/20  4:51 AM  Result Value Ref Range   Magnesium 1.6 (L) 1.7 - 2.4 mg/dL    Comment: Performed at [facility] [address] [place], [place] [address]  Phosphorus     Status: None   Collection Time: 01/23/20  4:51 AM  Result Value Ref Range   Phosphorus 4.5 2.5 - 4.6 mg/dL    Comment: Performed at [facility] [address] [place], [place] [address]  Glucose, capillary     Status: Abnormal   Collection Time: 01/23/20  7:20 AM  Result Value Ref Range   Glucose-Capillary 141 (H) 70 - 99 mg/dL    Comment: Glucose reference range applies only to samples taken after fasting for at least 8 hours.     ROS: General fatigue and weak Eyes no visual loss Ears nose mouth throat no hearing loss sore throat epistaxis Cardiovascular complains of shortness of breath which is progressive no chest pain Respiratory no cough wheeze abscess Abdominal system no nausea vomiting abdominal pain or diarrhea Urogenital no urgency frequency dysuria Neurologic  no stroke seizures numbness tingling pins-and-needles     Physical Exam: Vitals:   01/22/20 2037 01/23/20 0347  BP: 124/68 116/69  Pulse: 87 95  Resp: 19 19  Temp: (!) 97.5 F (36.4 C) (!) 97.5 F (36.4 C)  SpO2: 96% 98%     General: Gen alert, in bed in ICU, HOH, Ox 3 No rash, cyanosis or gangrene Sclera anicteric, throat clear and mildly dry   No jvd or bruits Chest clear bilat to bases, no rales RRR no MRG Abd soft ntnd no mass or ascites, slightly protuberant GU normal male, no cath in  MS no joint effusions or deformity Ext no LE edema, no wounds or ulcers Neuro is alert, Ox 3 , nf, no asterixis  Assessment/Plan: 1.Acute on chronic kidney disease.  Patiently recently discharged with acute kidney injury and required dialysis on a as needed basis last dialysis was 01/11/2020.  Urine microscopy 04/22/2020 showed 6-10 RBCs per high-powered film.  There is also 100 mg deciliter proteinuria.  Creatinine has improved somewhat since 01/11/2020.  The concern would be the slightly worsening features of congestive heart failure.  Diuresis is being excellent over the last 24 hours and will continue 60 mg IV Lasix every 24 hours.  Baseline serum creatinine appears to be about 1.5.  We will continue to avoid ACE inhibitors.  Will transition to oral Lasix 80 mg twice daily 2. Hypertension/volume  -chest x-ray would suggest volume overload with congestive heart failure will transition to oral Lasix 80 mg twice daily.  Agree with stopping antihypertensive medication. 3. Anemia  -iron studies show iron deficiency will give IV iron 4.  Bone mineral will continue to  follow renal panels and monitor calcium phosphorus. 5.  Atrial fibrillation patient was on anticoagulation with Eliquis.  This is now converted to Coumadin due to his acute kidney injury 6.  Diabetes mellitus type 2 per primary service. 7.  History of peripheral vascular disease status post redo right femoral pop bypass.    Sherril Croon 01/23/2020, 8:18 AM

## 2020-01-23 NOTE — Progress Notes (Signed)
ANTICOAGULATION CONSULT NOTE - Follow Up Consult  Pharmacy Consult for Warfarin Indication: atrial fibrillation  No Known Allergies  Patient Measurements: Height: 5\' 11"  (180.3 cm) Weight: 143 lb 14.4 oz (65.3 kg) IBW/kg (Calculated) : 75.3  Vital Signs: Temp: 97.5 F (36.4 C) (03/30 0347) Temp Source: Oral (03/30 0347) BP: 116/69 (03/30 0347) Pulse Rate: 95 (03/30 0347)  Labs: Recent Labs    01/20/20 1439 01/21/20 0737 01/21/20 0737 01/21/20 0914 01/22/20 0511 01/22/20 0831 01/23/20 0451  HGB  --  8.9*   < >  --   --  9.4* 9.6*  HCT  --  29.0*  --   --   --  30.3* 31.1*  PLT  --  344  --   --   --  370 400  LABPROT  --  20.1*  --   --  19.7*  --  21.5*  INR  --  1.7*  --   --  1.7*  --  1.9*  CREATININE   < >  --   --  2.45*  --  2.72* 2.69*   < > = values in this interval not displayed.    Estimated Creatinine Clearance: 24.6 mL/min (A) (by C-G formula based on SCr of 2.69 mg/dL (H)).   Medical History: Past Medical History:  Diagnosis Date  . Aortic insufficiency    MODERATE WITH A BICUSPID AORTIC VAVLE  . Arterial occlusive disease    MULTILEVEL  . CHF (congestive heart failure) ([place])   . COPD (chronic obstructive pulmonary disease) ([place])   . Dilated cardiomyopathy (HCC)    WITH EJECTION FRACTION DOWN TO 20-25%--WITH CONGESTIVE HEART FAILURE  . Edema    LOWER EXTREMETIES  . Hypertension   . Normal coronary arteries 2009  . Orthopnea   . Peripheral arterial disease ([place])   . Pulmonary hypertension ([place])   . SOB (shortness of breath)     Medications:  Scheduled:  . amLODipine  5 mg Oral Daily  . carvedilol  6.25 mg Oral BID WC  . ferrous sulfate  325 mg Oral Q breakfast  . furosemide  60 mg Intravenous Q12H  . insulin aspart  0-9 Units Subcutaneous Q4H  . levETIRAcetam  500 mg Oral BID  . rosuvastatin  10 mg Oral q1800  . umeclidinium-vilanterol  1 puff Inhalation Daily  . Warfarin - Pharmacist Dosing Inpatient   Does not apply q1800     Assessment: Patient is a 36 yom that is being admitted for SOB. The patient has a Hx of Afib and is on warfarin for this. Patient was on apixaban prior to a recent AKI. If renal function remains improve after admission and diureses may consider switching back to apixaban. PTA warfarin regimen 2mg  daily.  INR remains subtherapeutic today but has trended up to 1.9. H/H improved. Plt wnl. No significant drug interactions noted   Goal of Therapy:  INR 2-3 Monitor platelets by anticoagulation protocol: Yes   Plan:  - warfarin 4 mg PO x 1 dose tonight  - Monitor daily INR and CBC  - Monitor for bleeding   Thank you,   Albertina Parr, PharmD., BCPS Clinical Pharmacist Clinical phone for 01/23/20 until 3:30pm: [phone] If after 3:30pm, please refer to [facility] for unit-specific pharmacist

## 2020-01-24 LAB — CBC WITH DIFFERENTIAL/PLATELET
Abs Immature Granulocytes: 0.1 10*3/uL — ABNORMAL HIGH (ref 0.00–0.07)
Basophils Absolute: 0 10*3/uL (ref 0.0–0.1)
Basophils Relative: 0 %
Eosinophils Absolute: 0.2 10*3/uL (ref 0.0–0.5)
Eosinophils Relative: 2 %
HCT: 31.8 % — ABNORMAL LOW (ref 39.0–52.0)
Hemoglobin: 9.9 g/dL — ABNORMAL LOW (ref 13.0–17.0)
Immature Granulocytes: 1 %
Lymphocytes Relative: 22 %
Lymphs Abs: 1.7 10*3/uL (ref 0.7–4.0)
MCH: 28.5 pg (ref 26.0–34.0)
MCHC: 31.1 g/dL (ref 30.0–36.0)
MCV: 91.6 fL (ref 80.0–100.0)
Monocytes Absolute: 1.3 10*3/uL — ABNORMAL HIGH (ref 0.1–1.0)
Monocytes Relative: 16 %
Neutro Abs: 4.6 10*3/uL (ref 1.7–7.7)
Neutrophils Relative %: 59 %
Platelets: 404 10*3/uL — ABNORMAL HIGH (ref 150–400)
RBC: 3.47 MIL/uL — ABNORMAL LOW (ref 4.22–5.81)
RDW: 17.5 % — ABNORMAL HIGH (ref 11.5–15.5)
WBC: 7.9 10*3/uL (ref 4.0–10.5)
nRBC: 0 % (ref 0.0–0.2)

## 2020-01-24 LAB — GLUCOSE, CAPILLARY
Glucose-Capillary: 102 mg/dL — ABNORMAL HIGH (ref 70–99)
Glucose-Capillary: 129 mg/dL — ABNORMAL HIGH (ref 70–99)
Glucose-Capillary: 95 mg/dL (ref 70–99)
Glucose-Capillary: 98 mg/dL (ref 70–99)

## 2020-01-24 LAB — COMPREHENSIVE METABOLIC PANEL
ALT: 16 U/L (ref 0–44)
AST: 15 U/L (ref 15–41)
Albumin: 2.6 g/dL — ABNORMAL LOW (ref 3.5–5.0)
Alkaline Phosphatase: 78 U/L (ref 38–126)
Anion gap: 13 (ref 5–15)
BUN: 40 mg/dL — ABNORMAL HIGH (ref 8–23)
CO2: 24 mmol/L (ref 22–32)
Calcium: 8.7 mg/dL — ABNORMAL LOW (ref 8.9–10.3)
Chloride: 99 mmol/L (ref 98–111)
Creatinine, Ser: 2.52 mg/dL — ABNORMAL HIGH (ref 0.61–1.24)
GFR calc Af Amer: 29 mL/min — ABNORMAL LOW (ref >60)
GFR calc non Af Amer: 25 mL/min — ABNORMAL LOW (ref >60)
Glucose, Bld: 100 mg/dL — ABNORMAL HIGH (ref 70–99)
Potassium: 3.7 mmol/L (ref 3.5–5.1)
Sodium: 136 mmol/L (ref 135–145)
Total Bilirubin: 0.5 mg/dL (ref 0.3–1.2)
Total Protein: 7.1 g/dL (ref 6.5–8.1)

## 2020-01-24 LAB — MAGNESIUM: Magnesium: 2.1 mg/dL (ref 1.7–2.4)

## 2020-01-24 LAB — PROTIME-INR
INR: 2.1 — ABNORMAL HIGH (ref 0.8–1.2)
Prothrombin Time: 23.5 seconds — ABNORMAL HIGH (ref 11.4–15.2)

## 2020-01-24 LAB — PHOSPHORUS: Phosphorus: 4.1 mg/dL (ref 2.5–4.6)

## 2020-01-24 MED ORDER — WARFARIN SODIUM 3 MG PO TABS: 3.0000 mg | ORAL_TABLET | Freq: Once | ORAL | Status: DC

## 2020-01-24 MED ORDER — ROSUVASTATIN CALCIUM 10 MG PO TABS: 10.0000 mg | ORAL_TABLET | Freq: Every day | ORAL | 0 refills | Status: DC

## 2020-01-24 MED ORDER — FUROSEMIDE 80 MG PO TABS: 80.0000 mg | ORAL_TABLET | Freq: Two times a day (BID) | ORAL | 0 refills | Status: DC

## 2020-01-24 NOTE — Progress Notes (Addendum)
ANTICOAGULATION CONSULT NOTE - Follow Up Consult  Pharmacy Consult for Warfarin Indication: atrial fibrillation  No Known Allergies  Patient Measurements: Height: 5\' 11"  (180.3 cm) Weight: 144 lb 8 oz (65.5 kg)(scale c) IBW/kg (Calculated) : 75.3  Vital Signs: Temp: 98 F (36.7 C) (03/31 1213) Temp Source: Oral (03/31 1213) BP: 140/74 (03/31 1213) Pulse Rate: 90 (03/31 1213)  Labs: Recent Labs    01/22/20 0511 01/22/20 0831 01/22/20 0831 01/23/20 0451 01/24/20 0429  HGB  --  9.4*   < > 9.6* 9.9*  HCT  --  30.3*  --  31.1* 31.8*  PLT  --  370  --  400 404*  LABPROT 19.7*  --   --  21.5* 23.5*  INR 1.7*  --   --  1.9* 2.1*  CREATININE  --  2.72*  --  2.69* 2.52*   < > = values in this interval not displayed.    Estimated Creatinine Clearance: 26.4 mL/min (A) (by C-G formula based on SCr of 2.52 mg/dL (H)).   Medical History: Past Medical History:  Diagnosis Date  . Aortic insufficiency    MODERATE WITH A BICUSPID AORTIC VAVLE  . Arterial occlusive disease    MULTILEVEL  . CHF (congestive heart failure) ([place])   . COPD (chronic obstructive pulmonary disease) ([place])   . Dilated cardiomyopathy ([facility])    WITH EJECTION FRACTION DOWN TO 20-25%--WITH CONGESTIVE HEART FAILURE  . Edema    LOWER EXTREMETIES  . Hypertension   . Normal coronary arteries 2009  . Orthopnea   . Peripheral arterial disease ([place])   . Pulmonary hypertension ([place])   . SOB (shortness of breath)     Medications:  Scheduled:  . ferrous sulfate  325 mg Oral Q breakfast  . furosemide  80 mg Oral BID  . insulin aspart  0-9 Units Subcutaneous Q4H  . levETIRAcetam  500 mg Oral BID  . rosuvastatin  10 mg Oral q1800  . umeclidinium-vilanterol  1 puff Inhalation Daily  . Warfarin - Pharmacist Dosing Inpatient   Does not apply q1800    Assessment: Patient is a 68 yom that is being admitted for SOB. The patient has a Hx of Afib and is on warfarin for this. Patient was on apixaban prior to a recent  AKI. If renal function remains improve after admission and diureses may consider switching back to apixaban. PTA warfarin regimen 2mg  daily.  INR currently therapeutic today trending up to 2.1. H/H improved. Plt wnl. No significant drug interactions noted   Goal of Therapy:  INR 2-3 Monitor platelets by anticoagulation protocol: Yes   Plan:  If discharging would have patient take warfarin 3 mg today then back to home dose tomorrow - Monitor daily INR and CBC  - Monitor for bleeding   Thank you,  Erin Hearing PharmD., BCPS Clinical Pharmacist 01/24/2020 2:02 PM

## 2020-01-24 NOTE — Discharge Summary (Signed)
Physician Discharge Summary  Kyle [name] TML:[id number] DOB: 02-Mar-1951 DOA: 01/20/2020  PCP: Alonna Buckler, MD  Admit date: 01/20/2020 Discharge date: 01/24/2020  Admitted From: home Disposition: home  Recommendations for Outpatient Follow-up:  1. Follow up with PCP in 1-2 weeks 2. Follow up [place] [facility] in 1-2 weeks 3. Please obtain BMP/CBC in one week 4. Please follow up on the following pending results:  Home Health: no Equipment/Devices: none  Discharge Condition:stable CODE STATUS:full Diet recommendation: Heart Healthy fluid restriction  Brief/Interim Summary: Kyle [name] a 69 y.o.malewith medical history significant ofnon ischemic cardiomyopathy, COPD on 5 L, PAD, HTN, T2DM and multiple other medical problems who presents after recently being discharged in the setting of acute renal failure, hyperkalemia, and severe lactic acidosis of unclear etiology which required intubation and temporary HD.  Hospital course: admitted with heart failure exacerbation due to lasix discontinued after severe AKI with recent hospital stay. He was appropriately diuresed with IV lasix. Then he was able to transition to oral lasix with stable renal function. He was monitored closely as initially required more than usual oxygen. His oxygen levels returned to normal on his home 5L oxygen with diuresis. He was feeling well day of discharge. Counseled about lasix dosing and need to follow up closely with PCP and nephrology. Call for weight gain, SOB, monitor sodium and fluid intake closely.   Discharge Diagnoses:  Active Problems:   Heart failure ([place])   Hypoxia  Discharge Instructions  Discharge Instructions    (HEART FAILURE PATIENTS) Call MD:  Anytime you have any of the following symptoms: 1) 3 pound weight gain in 24 hours or 5 pounds in 1 week 2) shortness of breath, with or without a dry hacking cough 3) swelling in the hands, feet or stomach 4) if you have to sleep on extra  pillows at night in order to breathe.   Complete by: As directed    Diet - low sodium heart healthy   Complete by: As directed    Increase activity slowly   Complete by: As directed      Allergies as of 01/24/2020   No Known Allergies     Medication List    TAKE these medications   Accu-Chek FastClix Lancets Misc   Accu-Chek FastClix Lancets Misc USE TO MONITOR BLOOD GLUCOSE 2 TIMES DAILY   Accu-Chek Guide test strip Generic drug: glucose blood   Accu-Chek Guide test strip Generic drug: glucose blood one strip (1 each dose) by Other route 2 (two) times daily. Test blood glucose 2 times a day.   amLODipine 5 MG tablet Commonly known as: NORVASC Take 1 tablet (5 mg total) by mouth daily.   Anoro Ellipta 62.5-25 MCG/INH Aepb Generic drug: umeclidinium-vilanterol INHALE 1 PUFF BY MOUTH ONCE DAILY . APPOINTMENT REQUIRED FOR FUTURE REFILLS   Anoro Ellipta 62.5-25 MCG/INH Aepb Generic drug: umeclidinium-vilanterol Inhale 1 puff into the lungs daily.   BD Pen Needle Nano U/F 32G X 4 MM Misc Generic drug: Insulin Pen Needle   BD Pen Needle Nano U/F 32G X 4 MM Misc Generic drug: Insulin Pen Needle USE TO INJECT INTO THE SKIN DAILY.   carvedilol 6.25 MG tablet Commonly known as: COREG Take 1 tablet (6.25 mg total) by mouth 2 (two) times daily with a meal.   feeding supplement (NEPRO CARB STEADY) Liqd Take 237 mLs by mouth 3 (three) times daily between meals for 15 days.   furosemide 20 MG tablet Commonly known as: LASIX Take 1  tablet (20 mg total) by mouth 2 (two) times daily.   furosemide 80 MG tablet Commonly known as: LASIX Take 1 tablet (80 mg total) by mouth 2 (two) times daily.   guaiFENesin 600 MG 12 hr tablet Commonly known as: MUCINEX Take 2 tablets (1,200 mg total) by mouth 2 (two) times daily. What changed: how much to take   insulin glargine 100 UNIT/ML injection Commonly known as: LANTUS Inject 0.06 mLs (6 Units total) into the skin at bedtime.    levETIRAcetam 500 MG tablet Commonly known as: KEPPRA Take 1 tablet (500 mg total) by mouth 2 (two) times daily.   multivitamin with minerals Tabs tablet Take 1 tablet by mouth daily.   OXYGEN Inhale 4.5 L into the lungs continuous.   rosuvastatin 10 MG tablet Commonly known as: CRESTOR Take 1 tablet (10 mg total) by mouth daily at 6 PM. What changed:   medication strength  how much to take  when to take this   warfarin 2 MG tablet Commonly known as: COUMADIN Take 1 tablet (2 mg total) by mouth daily at 6 PM.      Follow-up Information    [facility] [name]. Schedule an appointment as soon as possible for a visit in 1 week.   Why: Office will follow up with patient to schedule a appointment Contact information: [place] [address] [phone]        Zella Richer II, MD. Schedule an appointment as soon as possible for a visit on 01/29/2020.   Specialty: Family Medicine Why: @ 10:00am Contact information: [place] [address] [phone]          No Known Allergies  Consultations:  Nephrology  Procedures/Studies: DG Chest 1 View  Result Date: 01/07/2020 CLINICAL DATA:  COPD EXAM: CHEST  1 VIEW COMPARISON:  12/25/2019 FINDINGS: Mild cardiomegaly. Large-bore right neck multi lumen vascular catheter. Mild, diffuse interstitial pulmonary opacity. Probable small, persistent layering pleural effusions. Underlying emphysema. The visualized skeletal structures are unremarkable. IMPRESSION: 1. Mild cardiomegaly with mild interstitial pulmonary edema and effusions. 2.  Emphysema (ICD10-J43.9). Electronically Signed   By: Eddie Candle M.D.   On: 01/07/2020 11:51   DG Elbow 2 Views Left  Result Date: 01/04/2020 CLINICAL DATA:  Left elbow pain and swelling.  No known injury. EXAM: LEFT ELBOW - 2 VIEW COMPARISON:  None. FINDINGS: There is no evidence of fracture, dislocation, or joint effusion. There is no evidence of arthropathy or  other focal bone abnormality. Soft tissues are unremarkable. IMPRESSION: Negative. Electronically Signed   By: Kerby Moors M.D.   On: 01/04/2020 11:40   IR Fluoro Guide CV Line Right  Result Date: 12/28/2019 INDICATION: 69 year old male with a history of renal failure EXAM: TUNNELED CENTRAL VENOUS HEMODIALYSIS CATHETER PLACEMENT WITH ULTRASOUND AND FLUOROSCOPIC GUIDANCE MEDICATIONS: 2 g Ancef. The antibiotic was given in an appropriate time interval prior to skin puncture. ANESTHESIA/SEDATION: Moderate (conscious) sedation was employed during this procedure. A total of Versed 1.0 mg and Fentanyl 50 mcg was administered intravenously. Moderate Sedation Time: 11 minutes. The patient's level of consciousness and vital signs were monitored continuously by radiology nursing throughout the procedure under my direct supervision. FLUOROSCOPY TIME:  Fluoroscopy Time: 0 minutes 12 seconds (1 mGy). COMPLICATIONS: None PROCEDURE: The procedure, risks, benefits, and alternatives were explained to the patient. Questions regarding the procedure were encouraged and answered. The patient understands and consents to the procedure. The right neck and chest was prepped with chlorhexidine, and draped in  the usual sterile fashion using maximum barrier technique (cap and mask, sterile gown, sterile gloves, large sterile sheet, hand hygiene and cutaneous antiseptic). Antibiotic prophylaxis was initiated with 2.0g Ancef administered IV within 1 hour prior to skin incision. Local anesthesia was attained by infiltration with 1% lidocaine without epinephrine. Ultrasound demonstrated patency of the right internal jugular vein, and this was documented with an image. Under real-time ultrasound guidance, this vein was accessed with a 21 gauge micropuncture needle and image documentation was performed. A small dermatotomy was made at the access site with an 11 scalpel. A 0.018" wire was advanced into the SVC and the access needle exchanged  for a 19F micropuncture vascular sheath. The 0.018" wire was then removed and a 0.035" wire advanced into the IVC. Upon withdrawal of the 018 wire, the wire was marked for appropriate length of the internal portion of the catheter. A 19 cm tip to cuff catheter was selected. The skin and subcutaneous tissues of the right chest wall were then generously infiltrated with 1% lidocaine without epinephrine from the chest wall to the puncture site at the right IJ. The hemodialysis catheter was then back tunneled from the right chest wall incision to the dermatotomy at the right neck. The venous access site was then serially dilated and a peel away vascular sheath placed over the wire. The wire was removed and the catheter was placed through the peel-away sheath. The catheter tip is positioned in the upper right atrium. This was documented with a spot image. Both ports of the hemodialysis catheter were then tested for excellent function. The ports were then locked with heparinized lock. The incision at the neck was then sealed with Dermabond. Patient tolerated the procedure well and remained hemodynamically stable throughout. No complications were encountered and no significant blood loss was encountered. IMPRESSION: Status post right IJ tunneled hemodialysis catheter. Catheter ready for use. Signed, Dulcy Fanny. Dellia Nims, RPVI [facility] Electronically Signed   By: Corrie Mckusick D.O.   On: 12/28/2019 16:22   IR Removal Tun Cv Cath W/O FL  Result Date: 01/11/2020 INDICATION: Patient with acute on chronic kidney disease requiring short initiation of hemodialysis via right tunneled dialysis catheter placed 12/28/2019 by Dr. Earleen [place]. His kidney function has improved and he is no longer in need of his dialysis catheter. Request is made for removal. EXAM: REMOVAL TUNNELED CENTRAL VENOUS CATHETER MEDICATIONS: None COMPLICATIONS: None immediate. PROCEDURE: Informed  written consent was obtained from the patient after a thorough discussion of the procedural risks, benefits and alternatives. All questions were addressed. Maximal Sterile Barrier Technique was utilized including caps, mask, sterile gowns, sterile gloves, sterile drape, hand hygiene and skin antiseptic. A timeout was performed prior to the initiation of the procedure. The patient's right chest and catheter was prepped and draped in a normal sterile fashion. Heparin was removed from both ports of catheter. 1% lidocaine was used for local anesthesia. Using gentle blunt dissection the cuff of the catheter was exposed and the catheter was removed in its entirety. Pressure was held till hemostasis was obtained. A sterile dressing was applied. The patient tolerated the procedure well with no immediate complications. IMPRESSION: Successful catheter removal as described above. Read by: Brynda Greathouse PA-C Electronically Signed   By: Sandi Mariscal M.D.   On: 01/11/2020 13:24   IR US Guide Vasc Access Right  Result Date: 12/28/2019 INDICATION: 69 year old male with a history of renal failure EXAM: TUNNELED CENTRAL VENOUS HEMODIALYSIS CATHETER PLACEMENT WITH  ULTRASOUND AND FLUOROSCOPIC GUIDANCE MEDICATIONS: 2 g Ancef. The antibiotic was given in an appropriate time interval prior to skin puncture. ANESTHESIA/SEDATION: Moderate (conscious) sedation was employed during this procedure. A total of Versed 1.0 mg and Fentanyl 50 mcg was administered intravenously. Moderate Sedation Time: 11 minutes. The patient's level of consciousness and vital signs were monitored continuously by radiology nursing throughout the procedure under my direct supervision. FLUOROSCOPY TIME:  Fluoroscopy Time: 0 minutes 12 seconds (1 mGy). COMPLICATIONS: None PROCEDURE: The procedure, risks, benefits, and alternatives were explained to the patient. Questions regarding the procedure were encouraged and answered. The patient understands and consents to  the procedure. The right neck and chest was prepped with chlorhexidine, and draped in the usual sterile fashion using maximum barrier technique (cap and mask, sterile gown, sterile gloves, large sterile sheet, hand hygiene and cutaneous antiseptic). Antibiotic prophylaxis was initiated with 2.0g Ancef administered IV within 1 hour prior to skin incision. Local anesthesia was attained by infiltration with 1% lidocaine without epinephrine. Ultrasound demonstrated patency of the right internal jugular vein, and this was documented with an image. Under real-time ultrasound guidance, this vein was accessed with a 21 gauge micropuncture needle and image documentation was performed. A small dermatotomy was made at the access site with an 11 scalpel. A 0.018" wire was advanced into the SVC and the access needle exchanged for a 59F micropuncture vascular sheath. The 0.018" wire was then removed and a 0.035" wire advanced into the IVC. Upon withdrawal of the 018 wire, the wire was marked for appropriate length of the internal portion of the catheter. A 19 cm tip to cuff catheter was selected. The skin and subcutaneous tissues of the right chest wall were then generously infiltrated with 1% lidocaine without epinephrine from the chest wall to the puncture site at the right IJ. The hemodialysis catheter was then back tunneled from the right chest wall incision to the dermatotomy at the right neck. The venous access site was then serially dilated and a peel away vascular sheath placed over the wire. The wire was removed and the catheter was placed through the peel-away sheath. The catheter tip is positioned in the upper right atrium. This was documented with a spot image. Both ports of the hemodialysis catheter were then tested for excellent function. The ports were then locked with heparinized lock. The incision at the neck was then sealed with Dermabond. Patient tolerated the procedure well and remained hemodynamically stable  throughout. No complications were encountered and no significant blood loss was encountered. IMPRESSION: Status post right IJ tunneled hemodialysis catheter. Catheter ready for use. Signed, Dulcy Fanny. Dellia Nims, RPVI Vascular and Interventional Radiology Specialists [facility] Electronically Signed   By: Corrie Mckusick D.O.   On: 12/28/2019 16:22   DG CHEST PORT 1 VIEW  Result Date: 01/23/2020 CLINICAL DATA:  Hypoxia, heart failure exacerbation, history COPD, dilated cardiomyopathy, CHF, hypertension EXAM: PORTABLE CHEST 1 VIEW COMPARISON:  Portable exam 1217 hours compared to 01/22/2020 FINDINGS: Enlargement of cardiac silhouette. Atherosclerotic calcification aorta. Emphysematous changes with mild interstitial infiltrates at the lower lungs, slightly improved since previous exam. Mild LEFT basilar atelectasis. No pleural effusion or pneumothorax. Bones demineralized. IMPRESSION: Emphysematous changes with decreased interstitial infiltrates. Enlargement of cardiac silhouette with mild persistent LEFT basilar atelectasis. Electronically Signed   By: Lavonia Dana M.D.   On: 01/23/2020 12:34   DG CHEST PORT 1 VIEW  Result Date: 01/22/2020 CLINICAL DATA:  69 year old male with history of hypoxia. History of pulmonary edema.  EXAM: PORTABLE CHEST 1 VIEW COMPARISON:  Chest x-ray 01/21/2020. FINDINGS: There is cephalization of the pulmonary vasculature and slight indistinctness of the interstitial markings suggestive of mild pulmonary edema. No definite pleural effusions. Mild cardiomegaly. Upper mediastinal contours are within normal limits. IMPRESSION: 1. Slight worsening of congestive heart failure, as above. Electronically Signed   By: Vinnie Langton M.D.   On: 01/22/2020 12:16   Portable chest 1 View  Result Date: 01/21/2020 CLINICAL DATA:  COPD, ischemic cardiomyopathy EXAM: PORTABLE CHEST 1 VIEW COMPARISON:  01/20/2020 FINDINGS: Bilateral diffuse interstitial thickening. No pleural effusion or  pneumothorax. Stable cardiomediastinal silhouette. No acute osseous abnormality. IMPRESSION: Mild CHF. Electronically Signed   By: Kathreen Devoid   On: 01/21/2020 09:33   DG Chest Port 1 View  Result Date: 01/20/2020 CLINICAL DATA:  Shortness of breath since Thursday EXAM: PORTABLE CHEST 1 VIEW COMPARISON:  January 07, 2020 FINDINGS: The mediastinal contour is normal. The heart size is enlarged. Mild increased pulmonary interstitium is identified bilaterally. Lungs are hyperinflated. The bony structures are stable. IMPRESSION: Mild congestive heart failure. Electronically Signed   By: Abelardo Diesel M.D.   On: 01/20/2020 14:41   Overnight EEG with video  Result Date: 12/26/2019 Lora Havens, MD     12/26/2019  3:29 PM Patient Name:Kyle [name] DXI:[id number] Epilepsy Attending:Priyanka [name] Referring Physician/Provider:Dr Karena Addison Aroor Duration: 12/25/2019 1127 to 12/26/2019 1054  Patient history:69 year old male who presented to the hospital today with acute renal failure and AMS. Had sudden onset of worsened AMS with decreased movement. 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  Level of alertness:awake/lethargic, asleep  AEDs during EEG study: LEV  Technical aspects: This EEG study was done with scalp electrodes positioned according to the 10-20 International system of electrode placement. Electrical activity was acquired at a sampling rate of 500Hz  and reviewed with a high frequency filter of 70Hz  and a low frequency filter of 1Hz . EEG data were recorded continuously and digitally stored.  DESCRIPTION: During awake state, no clear posterior dominant rhythm was seen.  Sleep was characterized by vertex waves, maximal frontocentral.  EEG showed continuous generalized 3-5Hz  theta-delta slowing.Hyperventilation and photic stimulation were not performed.  ABNORMALITY - Continuous rhythmic slow, generalized   IMPRESSION: This study issuggestive of moderate to severe diffuse encephalopathy, non specific to etiology.No seizures or epileptiform discharges were seen throughout the recording.  EEG appears to be improving compared to previous day. Priyanka [name]    Subjective: Breathing is back to normal on 5L O2. He is feeling no chest pains. Slept well and eating fine. Denies liking the food much in the hospital. Denies abdominal pain, nausea, vomiting, diarrhea, constipation. Wants to go home as breathing is normal again.   Discharge Exam: Vitals:   01/24/20 1100 01/24/20 1213  BP:  140/74  Pulse:  90  Resp: 19 18  Temp:  98 F (36.7 C)  SpO2:  92%   Vitals:   01/24/20 0900 01/24/20 1000 01/24/20 1100 01/24/20 1213  BP:    140/74  Pulse:    90  Resp: 17 19 19 18   Temp:    98 F (36.7 C)  TempSrc:    Oral  SpO2:    92%  Weight:      Height:        General: Pt is alert, awake, not in acute distress Cardiovascular: RRR, S1/S2 +, no rubs, no gallops Respiratory: CTA bilaterally, no wheezing,  no rhonchi Abdominal: Soft, NT, ND, bowel sounds + Extremities: no edema, no cyanosis  The results of significant diagnostics from this hospitalization (including imaging, microbiology, ancillary and laboratory) are listed below for reference.     Microbiology: Recent Results (from the past 240 hour(s))  SARS CORONAVIRUS 2 (TAT 6-24 HRS) Nasopharyngeal Nasopharyngeal Swab     Status: None   Collection Time: 01/20/20  2:10 PM   Specimen: Nasopharyngeal Swab  Result Value Ref Range Status   SARS Coronavirus 2 NEGATIVE NEGATIVE Final    Comment: (NOTE) SARS-CoV-2 target nucleic acids are NOT DETECTED. The SARS-CoV-2 RNA is generally detectable in upper and lower respiratory specimens during the acute phase of infection. Negative results do not preclude SARS-CoV-2 infection, do not rule out co-infections with other pathogens, and should not be used as the sole basis for treatment or other  patient management decisions. Negative results must be combined with clinical observations, patient history, and epidemiological information. The expected result is Negative. Fact Sheet for Patients: SugarRoll.be Fact Sheet for Healthcare Providers: https://www.woods-mathews.com/ This test is not yet approved or cleared by the [place] FDA and  has been authorized for detection and/or diagnosis of SARS-CoV-2 by FDA under an Emergency Use Authorization (EUA). This EUA will remain  in effect (meaning this test can be used) for the duration of the COVID-19 declaration under Section 56 4(b)(1) of the Act, 21 U.S.C. section 360bbb-3(b)(1), unless the authorization is terminated or revoked sooner. Performed at [facility], [place] [address], [place], [place] [address]      Labs: BNP (last 3 results) Recent Labs    01/20/20 1439  BNP 382.5*   Basic Metabolic Panel: Recent Labs  Lab 01/20/20 1439 01/20/20 1439 01/20/20 1527 01/21/20 0914 01/22/20 0831 01/23/20 0451 01/24/20 0429  NA 144   < > 143 144 140 140 136  K 4.2   < > 4.1 4.2 4.0 4.0 3.7  CL 110  --   --  107 101 100 99  CO2 23  --   --  27 27 25 24   GLUCOSE 250*  --   --  155* 164* 114* 100*  BUN 25*  --   --  24* 30* 35* 40*  CREATININE 2.59*  --   --  2.45* 2.72* 2.69* 2.52*  CALCIUM 9.0  --   --  9.0 9.1 9.0 8.7*  MG  --   --   --   --   --  1.6* 2.1  PHOS  --   --   --   --   --  4.5 4.1   < > = values in this interval not displayed.   Liver Function Tests: Recent Labs  Lab 01/20/20 1439 01/21/20 0914 01/22/20 0831 01/23/20 0451 01/24/20 0429  AST 19 19 18 17 15   ALT 20 21 20 19 16   ALKPHOS 84 72 71 82 78  BILITOT 0.8 0.9 0.9 0.8 0.5  PROT 6.8 6.4* 6.8 7.2 7.1  ALBUMIN 2.6* 2.3* 2.5* 2.6* 2.6*   No results for input(s): LIPASE, AMYLASE in the last 168 hours. No results for input(s): AMMONIA in the last 168 hours. CBC: Recent Labs  Lab  01/20/20 1439 01/20/20 1439 01/20/20 1527 01/21/20 0737 01/22/20 0831 01/23/20 0451 01/24/20 0429  WBC 11.1*  --   --  10.1 8.1 7.8 7.9  NEUTROABS  --   --   --   --  5.5 4.6 4.6  HGB 9.3*   < > 8.8*  8.9* 9.4* 9.6* 9.9*  HCT 31.0*   < > 26.0* 29.0* 30.3* 31.1* 31.8*  MCV 96.0  --   --  92.4 91.3 92.6 91.6  PLT 351  --   --  344 370 400 404*   < > = values in this interval not displayed.   Cardiac Enzymes: No results for input(s): CKTOTAL, CKMB, CKMBINDEX, TROPONINI in the last 168 hours. BNP: Invalid input(s): POCBNP CBG: Recent Labs  Lab 01/23/20 2026 01/24/20 0001 01/24/20 0439 01/24/20 0746 01/24/20 1214  GLUCAP 117* 129* 98 102* 95   D-Dimer No results for input(s): DDIMER in the last 72 hours. Hgb A1c No results for input(s): HGBA1C in the last 72 hours. Lipid Profile No results for input(s): CHOL, HDL, LDLCALC, TRIG, CHOLHDL, LDLDIRECT in the last 72 hours. Thyroid function studies No results for input(s): TSH, T4TOTAL, T3FREE, THYROIDAB in the last 72 hours.  Invalid input(s): FREET3 Anemia work up Recent Labs    01/22/20 1647  TIBC 204*  IRON 28*   Urinalysis    Component Value Date/Time   COLORURINE YELLOW 12/24/2019 [place] 12/24/2019 1632   LABSPEC 1.014 12/24/2019 1632   PHURINE 5.0 12/24/2019 1632   GLUCOSEU 50 (A) 12/24/2019 1632   HGBUR MODERATE (A) 12/24/2019 1632   BILIRUBINUR NEGATIVE 12/24/2019 1632   KETONESUR 20 (A) 12/24/2019 1632   PROTEINUR 100 (A) 12/24/2019 1632   UROBILINOGEN 0.2 12/19/2014 0821   NITRITE NEGATIVE 12/24/2019 1632   LEUKOCYTESUR NEGATIVE 12/24/2019 1632   Sepsis Labs Invalid input(s): PROCALCITONIN,  WBC,  LACTICIDVEN Microbiology Recent Results (from the past 240 hour(s))  SARS CORONAVIRUS 2 (TAT 6-24 HRS) Nasopharyngeal Nasopharyngeal Swab     Status: None   Collection Time: 01/20/20  2:10 PM   Specimen: Nasopharyngeal Swab  Result Value Ref Range Status   SARS Coronavirus 2 NEGATIVE  NEGATIVE Final    Comment: (NOTE) SARS-CoV-2 target nucleic acids are NOT DETECTED. The SARS-CoV-2 RNA is generally detectable in upper and lower respiratory specimens during the acute phase of infection. Negative results do not preclude SARS-CoV-2 infection, do not rule out co-infections with other pathogens, and should not be used as the sole basis for treatment or other patient management decisions. Negative results must be combined with clinical observations, patient history, and epidemiological information. The expected result is Negative. Fact Sheet for Patients: SugarRoll.be Fact Sheet for Healthcare Providers: https://www.woods-mathews.com/ This test is not yet approved or cleared by the [place] FDA and  has been authorized for detection and/or diagnosis of SARS-CoV-2 by FDA under an Emergency Use Authorization (EUA). This EUA will remain  in effect (meaning this test can be used) for the duration of the COVID-19 declaration under Section 56 4(b)(1) of the Act, 21 U.S.C. section 360bbb-3(b)(1), unless the authorization is terminated or revoked sooner. Performed at [facility], [place] [address] [place], [place] [address]     Time coordinating discharge: Over 30 minutes  SIGNED: Hoyt Koch, MD  [facility] 01/24/2020, 3:02 PM Pager   If 7PM-7AM, please contact night-coverage [email or web address] Password [email or web address]

## 2020-01-24 NOTE — Progress Notes (Signed)
Pt transitioned from 5L high flow [place] to 5L [place]. O2 sat = 87-89% on 5L [place]. Will continue to monitor.

## 2020-01-24 NOTE — Consult Note (Signed)
[facility] Renal Consultation Note  Requesting MD:  Indication for Consultation: Acute on chronic kidney disease, maintenance of euvolemia, assessment and treatment of electrolyte abnormalities, assessment and treatment of acid-base abnormalities  HPI:  Kyle [name] is a 69 y.o. male.  Medical history of nonischemic cardiomyopathy COPD on 5 L of oxygen peripheral artery disease hypertension diabetes mellitus type 2.  Patient was admitted with increasing shortness of breath.  Chest x-ray was consistent with mild congestive heart failure.  His echocardiogram from 12/13/2019 showed an ejection fraction of 65-70% with concentric LVH and diastolic dysfunction.  He was recently seen by nephrology 12/24/2019.  This was for acute kidney injury in the setting of ACE inhibitor use.  Baseline serum creatinine appears but 1.5.  He required dialysis as needed during hospitalization.  His last dialysis treatment appeared to be 01/11/2020.  Urine output 1.8 L 01/23/2020  Blood pressure 1 3575 pulse 85 temperature 98.2 O2 sats 95% 5 L of oxygen  Sodium 136 potassium 3.7 chloride 99 CO2 24 BUN 40 creatinine 2.52 glucose 100 phosphorus 4.1 magnesium 2.1 albumin 2.6 calcium 8.7 hemoglobin 9.9     Current medications amlodipine 5 mg daily Coreg 6.25 mg twice daily Lasix 80 mg p.o. twice daily Crestor 10 mg daily warfarin 4 mg per pharmacy Keppra 500 mg twice daily.  IV ferric gluconate 250 mg x 2 doses    Creat  Date/Time Value Ref Range Status  11/12/2014 01:44 PM 0.99 0.50 - 1.35 mg/dL Final  11/09/2014 01:33 PM 0.92 0.50 - 1.35 mg/dL Final  08/20/2014 11:13 AM 0.94 0.50 - 1.35 mg/dL Final   Creatinine, Ser  Date/Time Value Ref Range Status  01/24/2020 04:29 AM 2.52 (H) 0.61 - 1.24 mg/dL Final  01/23/2020 04:51 AM 2.69 (H) 0.61 - 1.24 mg/dL Final  01/22/2020 08:31 AM 2.72 (H) 0.61 - 1.24 mg/dL Final  01/21/2020 09:14 AM 2.45 (H) 0.61 - 1.24 mg/dL Final  01/20/2020 02:39 PM 2.59 (H) 0.61  - 1.24 mg/dL Final  01/11/2020 03:35 AM 6.09 (H) 0.61 - 1.24 mg/dL Final  01/10/2020 06:20 AM 6.77 (H) 0.61 - 1.24 mg/dL Final  01/09/2020 03:34 AM 7.28 (H) 0.61 - 1.24 mg/dL Final  01/08/2020 10:12 AM 7.10 (H) 0.61 - 1.24 mg/dL Final  01/07/2020 04:10 AM 6.09 (H) 0.61 - 1.24 mg/dL Final    Comment:    DELTA CHECK NOTED  01/06/2020 05:17 AM 10.10 (H) 0.61 - 1.24 mg/dL Final  01/05/2020 07:15 AM 9.66 (H) 0.61 - 1.24 mg/dL Final  01/04/2020 04:38 AM 8.39 (H) 0.61 - 1.24 mg/dL Final  01/03/2020 04:31 AM 6.75 (H) 0.61 - 1.24 mg/dL Final  01/02/2020 05:18 AM 5.37 (H) 0.61 - 1.24 mg/dL Final    Comment:    DIALYSIS  01/01/2020 04:20 AM 8.56 (H) 0.61 - 1.24 mg/dL Final  12/31/2019 06:00 AM 6.63 (H) 0.61 - 1.24 mg/dL Final  12/30/2019 04:17 AM 4.97 (H) 0.61 - 1.24 mg/dL Final    Comment:    DELTA CHECK NOTED  12/29/2019 05:15 AM 7.78 (H) 0.61 - 1.24 mg/dL Final  12/28/2019 11:13 AM 6.85 (H) 0.61 - 1.24 mg/dL Final  12/27/2019 03:44 AM 8.50 (H) 0.61 - 1.24 mg/dL Final    Comment:    DELTA CHECK NOTED  12/26/2019 05:00 PM 4.24 (H) 0.61 - 1.24 mg/dL Final    Comment:    DIALYSIS  12/26/2019 06:29 AM 10.70 (H) 0.61 - 1.24 mg/dL Final  12/25/2019 04:25 AM 8.87 (H) 0.61 - 1.24 mg/dL Final  Comment:    DELTA CHECK NOTED  12/24/2019 08:35 PM 13.31 (H) 0.61 - 1.24 mg/dL Final  12/24/2019 02:45 PM 14.23 (H) 0.61 - 1.24 mg/dL Final  12/24/2019 10:05 AM 14.45 (H) 0.61 - 1.24 mg/dL Final  12/24/2019 04:19 AM 14.97 (H) 0.61 - 1.24 mg/dL Final  11/30/2019 09:37 AM 1.57 (H) 0.76 - 1.27 mg/dL Final  11/21/2019 09:37 AM 1.44 (H) 0.76 - 1.27 mg/dL Final  04/25/2019 04:13 AM 1.49 (H) 0.61 - 1.24 mg/dL Final  04/24/2019 07:04 AM 1.44 (H) 0.61 - 1.24 mg/dL Final  04/23/2019 02:22 AM 1.75 (H) 0.61 - 1.24 mg/dL Final  04/21/2019 06:31 AM 1.40 (H) 0.61 - 1.24 mg/dL Final  11/30/2018 02:40 AM 1.03 0.61 - 1.24 mg/dL Final  11/29/2018 03:09 AM 1.21 0.61 - 1.24 mg/dL Final  11/28/2018 03:36 AM 1.18 0.61 -  1.24 mg/dL Final  11/27/2018 03:40 AM 1.07 0.61 - 1.24 mg/dL Final  11/26/2018 03:55 AM 0.94 0.61 - 1.24 mg/dL Final  11/25/2018 07:41 AM 0.96 0.61 - 1.24 mg/dL Final  11/24/2018 03:14 AM 0.98 0.61 - 1.24 mg/dL Final  11/23/2018 03:28 AM 1.16 0.61 - 1.24 mg/dL Final  11/22/2018 02:10 AM 1.91 (H) 0.61 - 1.24 mg/dL Final  11/21/2018 02:20 AM 2.37 (H) 0.61 - 1.24 mg/dL Final  11/20/2018 11:41 PM 2.54 (H) 0.61 - 1.24 mg/dL Final  11/20/2018 07:27 PM 2.73 (H) 0.61 - 1.24 mg/dL Final  10/16/2016 07:35 AM 1.14 0.61 - 1.24 mg/dL Final  12/24/2014 04:36 AM 0.76 0.50 - 1.35 mg/dL Final  12/23/2014 04:35 AM 0.81 0.50 - 1.35 mg/dL Final  12/22/2014 04:09 AM 0.77 0.50 - 1.35 mg/dL Final  12/21/2014 06:45 AM 0.91 0.50 - 1.35 mg/dL Final  12/19/2014 04:35 AM 0.94 0.50 - 1.35 mg/dL Final     PMHx:   Past Medical History:  Diagnosis Date  . Aortic insufficiency    MODERATE WITH A BICUSPID AORTIC VAVLE  . Arterial occlusive disease    MULTILEVEL  . CHF (congestive heart failure) ([place])   . COPD (chronic obstructive pulmonary disease) ([place])   . Dilated cardiomyopathy (H[facility])    WITH EJECTION FRACTION DOWN TO 20-25%--WITH CONGESTIVE HEART FAILURE  . Edema    LOWER EXTREMETIES  . Hypertension   . Normal coronary arteries 2009  . Orthopnea   . Peripheral arterial disease ([place])   . Pulmonary hypertension ([place])   . SOB (shortness of breath)     Past Surgical History:  Procedure Laterality Date  . ABDOMINAL AORTAGRAM N/A 11/05/2014   Procedure: ABDOMINAL Maxcine Ham;  Surgeon: Angelia Mould, MD;  Location: [facility] CATH LAB;  Service: Cardiovascular;  Laterality: N/A;  . ABDOMINAL AORTOGRAM W/LOWER EXTREMITY Bilateral 04/21/2019   Procedure: ABDOMINAL AORTOGRAM W/LOWER EXTREMITY;  Surgeon: Angelia Mould, MD;  Location: [place] CV LAB;  Service: Cardiovascular;  Laterality: Bilateral;  . AORTA - BILATERAL FEMORAL ARTERY BYPASS GRAFT N/A 12/18/2014   Procedure: AORTOBIFEMORAL BYPASS  GRAFT;  Surgeon: Angelia Mould, MD;  Location: [facility];  Service: Vascular;  Laterality: N/A;  . CARDIAC CATHETERIZATION  2009   Nl Cors, EF 20%  . COLONOSCOPY    . ENDARTERECTOMY FEMORAL Left 12/18/2014   Procedure: ENDARTERECTOMY FEMORAL;  Surgeon: Angelia Mould, MD;  Location: [facility];  Service: Vascular;  Laterality: Left;  . FALSE ANEURYSM REPAIR Right 04/24/2019   Procedure: REPAIR OF RIGHT FEMORAL ARTERY PSEUDOANEURYSM;  Surgeon: Rosetta Posner, MD;  Location: [facility] [place];  Service: Vascular;  Laterality: Right;  . FEMORAL-POPLITEAL BYPASS GRAFT  02/21/2011   right  . FEMORAL-TIBIAL BYPASS GRAFT Right 04/24/2019   Procedure: REDO BYPASS GRAFT RIGHT FEMORAL-TIBIAL ARTERY USING LEFT LEG VEIN & HEMASHIELD GOLD 4mm GRAFT;  Surgeon: Rosetta Posner, MD;  Location: [facility];  Service: Vascular;  Laterality: Right;  . IR FLUORO GUIDE CV LINE RIGHT  12/28/2019  . IR REMOVAL TUN CV CATH W/O FL  01/11/2020  . IR US GUIDE VASC ACCESS RIGHT  12/28/2019  . OTHER SURGICAL HISTORY     POST RIGHT FEMOROPOPLITEAL BYPASS GRAFT  . PERIPHERAL VASCULAR CATHETERIZATION  11/05/2014   Procedure: LOWER EXTREMITY ANGIOGRAPHY;  Surgeon: Angelia Mould, MD;  Location: [facility] CATH LAB;  Service: Cardiovascular;;  . VEIN HARVEST Left 04/24/2019   Procedure: LEFT LEG SAPHENOUS VEIN HARVEST;  Surgeon: Rosetta Posner, MD;  Location: [facility] OR;  Service: Vascular;  Laterality: Left;    Family Hx:  Family History  Problem Relation Age of Onset  . Diabetes Mother   . Hyperlipidemia Sister   . Hypertension Sister     Social History:  reports that he quit smoking about 15 months ago. He has a 40.00 pack-year smoking history. He has never used smokeless tobacco. He reports that he does not drink alcohol or use drugs.  Allergies: No Known Allergies  Medications: Prior to Admission medications   Medication Sig Start Date End Date Taking? Authorizing Provider  ACCU-CHEK FASTCLIX LANCETS [place]  07/30/18  Yes [provider]  Accu-Chek FastClix Lancets MISC USE TO MONITOR BLOOD GLUCOSE 2 TIMES DAILY 11/06/19  Yes [provider]  ACCU-CHEK GUIDE test strip  11/30/18  Yes [provider]  amLODipine (NORVASC) 5 MG tablet Take 1 tablet (5 mg total) by mouth daily. 01/05/19  Yes Martinique, Peter M, MD  BD PEN NEEDLE NANO U/F 32G X 4 MM MISC  08/06/18  Yes [provider]  carvedilol (COREG) 6.25 MG tablet Take 1 tablet (6.25 mg total) by mouth 2 (two) times daily with a meal. 01/11/20 03/11/20 Yes Kc, Ramesh, MD  glucose blood (ACCU-CHEK GUIDE) test strip one strip (1 each dose) by Other route 2 (two) times daily. Test blood glucose 2 times a day. 11/21/19  Yes [provider]  guaiFENesin (MUCINEX) 600 MG 12 hr tablet Take 2 tablets (1,200 mg total) by mouth 2 (two) times daily. Patient taking differently: Take 600 mg by mouth 2 (two) times daily.  11/30/18  Yes Lavina Hamman, MD  insulin glargine (LANTUS) 100 UNIT/ML injection Inject 0.06 mLs (6 Units total) into the skin at bedtime. 01/11/20  Yes Kc, Maren Beach, MD  Insulin Pen Needle (BD PEN NEEDLE NANO U/F) 32G X 4 MM MISC USE TO INJECT INTO THE SKIN DAILY. 11/06/19  Yes [provider]  levETIRAcetam (KEPPRA) 500 MG tablet Take 1 tablet (500 mg total) by mouth 2 (two) times daily. 01/11/20 02/10/20 Yes Antonieta Pert, MD  Multiple Vitamin (MULTIVITAMIN WITH MINERALS) TABS tablet Take 1 tablet by mouth daily.   Yes [provider]  Nutritional Supplements (FEEDING SUPPLEMENT, NEPRO CARB STEADY,) LIQD Take 237 mLs by mouth 3 (three) times daily between meals for 15 days. 01/11/20 01/26/20 Yes Antonieta Pert, MD  OXYGEN Inhale 4.5 L into the lungs continuous.   Yes [provider]  rosuvastatin (CRESTOR) 40 MG tablet Take 1 tablet (40 mg total) by mouth daily. 12/01/19 02/29/20 Yes Martinique, Peter M, MD  umeclidinium-vilanterol [facility] ELLIPTA) 62.5-25 MCG/INH AEPB Inhale 1 puff into the lungs daily. 08/07/19  Yes [name]  P,  NP  warfarin (COUMADIN) 2 MG tablet Take 1 tablet (2 mg total) by mouth daily at 6 PM. 01/11/20 02/10/20 Yes Kc, Ramesh, MD  ANORO ELLIPTA 62.5-25 MCG/INH AEPB INHALE 1 PUFF BY MOUTH ONCE DAILY . APPOINTMENT REQUIRED FOR FUTURE REFILLS Patient not taking: No sig reported 07/31/19   Rigoberto Noel, MD  furosemide (LASIX) 20 MG tablet Take 1 tablet (20 mg total) by mouth 2 (two) times daily. 01/20/20   Pattricia Boss, MD     Labs:  Results for orders placed or performed during the hospital encounter of 01/20/20 (from the past 48 hour(s))  Glucose, capillary     Status: Abnormal   Collection Time: 01/22/20  8:29 AM  Result Value Ref Range   Glucose-Capillary 150 (H) 70 - 99 mg/dL    Comment: Glucose reference range applies only to samples taken after fasting for at least 8 hours.  CBC with Differential/Platelet     Status: Abnormal   Collection Time: 01/22/20  8:31 AM  Result Value Ref Range   WBC 8.1 4.0 - 10.5 K/uL   RBC 3.32 (L) 4.22 - 5.81 MIL/uL   Hemoglobin 9.4 (L) 13.0 - 17.0 g/dL   HCT 30.3 (L) 39.0 - 52.0 %   MCV 91.3 80.0 - 100.0 fL   MCH 28.3 26.0 - 34.0 pg   MCHC 31.0 30.0 - 36.0 g/dL   RDW 17.6 (H) 11.5 - 15.5 %   Platelets 370 150 - 400 K/uL   nRBC 0.4 (H) 0.0 - 0.2 %   Neutrophils Relative % 67 %   Neutro Abs 5.5 1.7 - 7.7 K/uL   Lymphocytes Relative 18 %   Lymphs Abs 1.5 0.7 - 4.0 K/uL   Monocytes Relative 11 %   Monocytes Absolute 0.9 0.1 - 1.0 K/uL   Eosinophils Relative 2 %   Eosinophils Absolute 0.2 0.0 - 0.5 K/uL   Basophils Relative 1 %   Basophils Absolute 0.0 0.0 - 0.1 K/uL   Immature Granulocytes 1 %   Abs Immature Granulocytes 0.07 0.00 - 0.07 K/uL    Comment: Performed at [facility], [address] [place], [place] [address]  Comprehensive metabolic panel     Status: Abnormal   Collection Time: 01/22/20  8:31 AM  Result Value Ref Range   Sodium 140 135 - 145 mmol/L   Potassium 4.0 3.5 - 5.1 mmol/L   Chloride 101 98 - 111 mmol/L   CO2 27 22 -  32 mmol/L   Glucose, Bld 164 (H) 70 - 99 mg/dL    Comment: Glucose reference range applies only to samples taken after fasting for at least 8 hours.   BUN 30 (H) 8 - 23 mg/dL   Creatinine, Ser 2.72 (H) 0.61 - 1.24 mg/dL   Calcium 9.1 8.9 - 10.3 mg/dL   Total Protein 6.8 6.5 - 8.1 g/dL   Albumin 2.5 (L) 3.5 - 5.0 g/dL   AST 18 15 - 41 U/L   ALT 20 0 - 44 U/L   Alkaline Phosphatase 71 38 - 126 U/L   Total Bilirubin 0.9 0.3 - 1.2 mg/dL   GFR calc non Af Amer 23 (L) >60 mL/min   GFR calc Af Amer 27 (L) >60 mL/min   Anion gap 12 5 - 15    Comment: Performed at [facility] [address], [place], [place] [address]  Glucose, capillary     Status: None   Collection Time: 01/22/20 12:35 PM  Result Value Ref Range   Glucose-Capillary 76 70 - 99 mg/dL    Comment: Glucose reference range applies only to samples taken after fasting for at least 8 hours.  Glucose, capillary     Status: Abnormal   Collection Time: 01/22/20  4:40 PM  Result Value Ref Range   Glucose-Capillary 120 (H) 70 - 99 mg/dL    Comment: Glucose reference range applies only to samples taken after fasting for at least 8 hours.  Iron and TIBC     Status: Abnormal   Collection Time: 01/22/20  4:47 PM  Result Value Ref Range   Iron 28 (L) 45 - 182 ug/dL   TIBC 204 (L) 250 - 450 ug/dL   Saturation Ratios 14 (L) 17.9 - 39.5 %   UIBC 176 ug/dL    Comment: Performed at [facility] [address], [place], [place] [address]  Glucose, capillary     Status: Abnormal   Collection Time: 01/22/20  8:35 PM  Result Value Ref Range   Glucose-Capillary 192 (H) 70 - 99 mg/dL    Comment: Glucose reference range applies only to samples taken after fasting for at least 8 hours.  Glucose, capillary     Status: Abnormal   Collection Time: 01/23/20 12:39 AM  Result Value Ref Range   Glucose-Capillary 101 (H) 70 - 99 mg/dL    Comment: Glucose reference range applies only to samples taken after fasting for at least 8 hours.   Glucose, capillary     Status: Abnormal   Collection Time: 01/23/20  4:12 AM  Result Value Ref Range   Glucose-Capillary 117 (H) 70 - 99 mg/dL    Comment: Glucose reference range applies only to samples taken after fasting for at least 8 hours.  Protime-INR     Status: Abnormal   Collection Time: 01/23/20  4:51 AM  Result Value Ref Range   Prothrombin Time 21.5 (H) 11.4 - 15.2 seconds   INR 1.9 (H) 0.8 - 1.2    Comment: (NOTE) INR goal varies based on device and disease states. Performed at [facility], [place] [address] [place], [place] [address]   CBC with Differential/Platelet     Status: Abnormal   Collection Time: 01/23/20  4:51 AM  Result Value Ref Range   WBC 7.8 4.0 - 10.5 K/uL   RBC 3.36 (L) 4.22 - 5.81 MIL/uL   Hemoglobin 9.6 (L) 13.0 - 17.0 g/dL   HCT 31.1 (L) 39.0 - 52.0 %   MCV 92.6 80.0 - 100.0 fL   MCH 28.6 26.0 - 34.0 pg   MCHC 30.9 30.0 - 36.0 g/dL   RDW 17.4 (H) 11.5 - 15.5 %   Platelets 400 150 - 400 K/uL   nRBC 0.0 0.0 - 0.2 %   Neutrophils Relative % 60 %   Neutro Abs 4.6 1.7 - 7.7 K/uL   Lymphocytes Relative 22 %   Lymphs Abs 1.7 0.7 - 4.0 K/uL   Monocytes Relative 14 %   Monocytes Absolute 1.1 (H) 0.1 - 1.0 K/uL   Eosinophils Relative 2 %   Eosinophils Absolute 0.2 0.0 - 0.5 K/uL   Basophils Relative 1 %   Basophils Absolute 0.0 0.0 - 0.1 K/uL   Immature Granulocytes 1 %   Abs Immature Granulocytes 0.11 (H) 0.00 - 0.07 K/uL    Comment: Performed at [facility] [address] [place], [place] [address]  Comprehensive metabolic panel     Status: Abnormal  Collection Time: 01/23/20  4:51 AM  Result Value Ref Range   Sodium 140 135 - 145 mmol/L   Potassium 4.0 3.5 - 5.1 mmol/L   Chloride 100 98 - 111 mmol/L   CO2 25 22 - 32 mmol/L   Glucose, Bld 114 (H) 70 - 99 mg/dL    Comment: Glucose reference range applies only to samples taken after fasting for at least 8 hours.   BUN 35 (H) 8 - 23 mg/dL   Creatinine, Ser 2.69 (H) 0.61 - 1.24  mg/dL   Calcium 9.0 8.9 - 10.3 mg/dL   Total Protein 7.2 6.5 - 8.1 g/dL   Albumin 2.6 (L) 3.5 - 5.0 g/dL   AST 17 15 - 41 U/L   ALT 19 0 - 44 U/L   Alkaline Phosphatase 82 38 - 126 U/L   Total Bilirubin 0.8 0.3 - 1.2 mg/dL   GFR calc non Af Amer 23 (L) >60 mL/min   GFR calc Af Amer 27 (L) >60 mL/min   Anion gap 15 5 - 15    Comment: Performed at [facility] [address], [place], [place] [address]  Magnesium     Status: Abnormal   Collection Time: 01/23/20  4:51 AM  Result Value Ref Range   Magnesium 1.6 (L) 1.7 - 2.4 mg/dL    Comment: Performed at [facility] [address] [place], [place] [address]  Phosphorus     Status: None   Collection Time: 01/23/20  4:51 AM  Result Value Ref Range   Phosphorus 4.5 2.5 - 4.6 mg/dL    Comment: Performed at [facility] [address], [place], [place] [address]  Glucose, capillary     Status: Abnormal   Collection Time: 01/23/20  7:20 AM  Result Value Ref Range   Glucose-Capillary 141 (H) 70 - 99 mg/dL    Comment: Glucose reference range applies only to samples taken after fasting for at least 8 hours.  Glucose, capillary     Status: None   Collection Time: 01/23/20 12:03 PM  Result Value Ref Range   Glucose-Capillary 98 70 - 99 mg/dL    Comment: Glucose reference range applies only to samples taken after fasting for at least 8 hours.   Comment 1 Notify RN    Comment 2 Document in Chart   Glucose, capillary     Status: Abnormal   Collection Time: 01/23/20  4:13 PM  Result Value Ref Range   Glucose-Capillary 228 (H) 70 - 99 mg/dL    Comment: Glucose reference range applies only to samples taken after fasting for at least 8 hours.   Comment 1 Notify RN    Comment 2 Document in Chart   Glucose, capillary     Status: Abnormal   Collection Time: 01/23/20  8:26 PM  Result Value Ref Range   Glucose-Capillary 117 (H) 70 - 99 mg/dL    Comment: Glucose reference range applies only to samples taken after fasting for  at least 8 hours.   Comment 1 Notify RN    Comment 2 Document in Chart   Glucose, capillary     Status: Abnormal   Collection Time: 01/24/20 12:01 AM  Result Value Ref Range   Glucose-Capillary 129 (H) 70 - 99 mg/dL    Comment: Glucose reference range applies only to samples taken after fasting for at least 8 hours.   Comment 1 Notify RN    Comment 2 Document in Chart  Protime-INR     Status: Abnormal   Collection Time: 01/24/20  4:29 AM  Result Value Ref Range   Prothrombin Time 23.5 (H) 11.4 - 15.2 seconds   INR 2.1 (H) 0.8 - 1.2    Comment: (NOTE) INR goal varies based on device and disease states. Performed at [facility], [place] [address], [place], [place] [address]   CBC with Differential/Platelet     Status: Abnormal   Collection Time: 01/24/20  4:29 AM  Result Value Ref Range   WBC 7.9 4.0 - 10.5 K/uL   RBC 3.47 (L) 4.22 - 5.81 MIL/uL   Hemoglobin 9.9 (L) 13.0 - 17.0 g/dL   HCT 31.8 (L) 39.0 - 52.0 %   MCV 91.6 80.0 - 100.0 fL   MCH 28.5 26.0 - 34.0 pg   MCHC 31.1 30.0 - 36.0 g/dL   RDW 17.5 (H) 11.5 - 15.5 %   Platelets 404 (H) 150 - 400 K/uL   nRBC 0.0 0.0 - 0.2 %   Neutrophils Relative % 59 %   Neutro Abs 4.6 1.7 - 7.7 K/uL   Lymphocytes Relative 22 %   Lymphs Abs 1.7 0.7 - 4.0 K/uL   Monocytes Relative 16 %   Monocytes Absolute 1.3 (H) 0.1 - 1.0 K/uL   Eosinophils Relative 2 %   Eosinophils Absolute 0.2 0.0 - 0.5 K/uL   Basophils Relative 0 %   Basophils Absolute 0.0 0.0 - 0.1 K/uL   Immature Granulocytes 1 %   Abs Immature Granulocytes 0.10 (H) 0.00 - 0.07 K/uL    Comment: Performed at [facility] [address] [place], [place] [address]  Comprehensive metabolic panel     Status: Abnormal   Collection Time: 01/24/20  4:29 AM  Result Value Ref Range   Sodium 136 135 - 145 mmol/L   Potassium 3.7 3.5 - 5.1 mmol/L   Chloride 99 98 - 111 mmol/L   CO2 24 22 - 32 mmol/L   Glucose, Bld 100 (H) 70 - 99 mg/dL    Comment: Glucose reference range  applies only to samples taken after fasting for at least 8 hours.   BUN 40 (H) 8 - 23 mg/dL   Creatinine, Ser 2.52 (H) 0.61 - 1.24 mg/dL   Calcium 8.7 (L) 8.9 - 10.3 mg/dL   Total Protein 7.1 6.5 - 8.1 g/dL   Albumin 2.6 (L) 3.5 - 5.0 g/dL   AST 15 15 - 41 U/L   ALT 16 0 - 44 U/L   Alkaline Phosphatase 78 38 - 126 U/L   Total Bilirubin 0.5 0.3 - 1.2 mg/dL   GFR calc non Af Amer 25 (L) >60 mL/min   GFR calc Af Amer 29 (L) >60 mL/min   Anion gap 13 5 - 15    Comment: Performed at [facility] [name] [place], [place] [address]  Magnesium     Status: None   Collection Time: 01/24/20  4:29 AM  Result Value Ref Range   Magnesium 2.1 1.7 - 2.4 mg/dL    Comment: Performed at [facility] [address] [place], [place] [address]  Phosphorus     Status: None   Collection Time: 01/24/20  4:29 AM  Result Value Ref Range   Phosphorus 4.1 2.5 - 4.6 mg/dL    Comment: Performed at [facility] [address], [place], [place] [address]  Glucose, capillary     Status: None   Collection  Time: 01/24/20  4:39 AM  Result Value Ref Range   Glucose-Capillary 98 70 - 99 mg/dL    Comment: Glucose reference range applies only to samples taken after fasting for at least 8 hours.   Comment 1 Notify RN    Comment 2 Document in Chart   Glucose, capillary     Status: Abnormal   Collection Time: 01/24/20  7:46 AM  Result Value Ref Range   Glucose-Capillary 102 (H) 70 - 99 mg/dL    Comment: Glucose reference range applies only to samples taken after fasting for at least 8 hours.     ROS: General fatigue and weak Eyes no visual loss Ears nose mouth throat no hearing loss sore throat epistaxis Cardiovascular complains of shortness of breath which is progressive no chest pain Respiratory no cough wheeze abscess Abdominal system no nausea vomiting abdominal pain or diarrhea Urogenital no urgency frequency dysuria Neurologic no stroke seizures numbness tingling  pins-and-needles     Physical Exam: Vitals:   01/24/20 0252 01/24/20 0426  BP:  135/75  Pulse:  91  Resp:  18  Temp:  98.2 F (36.8 C)  SpO2: 95% 95%     General: Gen alert, in bed in ICU, HOH, Ox 3 No rash, cyanosis or gangrene Sclera anicteric, throat clear and mildly dry   No jvd or bruits Chest clear bilat to bases, no rales RRR no MRG Abd soft ntnd no mass or ascites, slightly protuberant GU normal male, no cath in  MS no joint effusions or deformity Ext no LE edema, no wounds or ulcers Neuro is alert, Ox 3 , nf, no asterixis  Assessment/Plan: 1.Acute on chronic kidney disease.  Patiently recently discharged with acute kidney injury and required dialysis on a as needed basis last dialysis was 01/11/2020.  Urine microscopy 04/22/2020 showed 6-10 RBCs per high-powered film.  There is also 100 mg deciliter proteinuria.  Creatinine has improved somewhat since 01/11/2020.  The concern would be the slightly worsening features of congestive heart failure.  Diuresis is being excellent over the last 24 hours and will continue 60 mg IV Lasix every 24 hours.  Baseline serum creatinine appears to be about 1.5.  We will continue to avoid ACE inhibitors.  Transition to oral Lasix 80 mg twice daily and diuresing well. 2. Hypertension/volume  -chest x-ray would suggest volume overload with congestive heart failure   transitioned to oral Lasix 80 mg twice daily.  Agree with stopping antihypertensive medication. 3. Anemia  -iron studies tolerating IV iron x2 doses 4.  Bone mineral will continue to follow renal panels and monitor calcium phosphorus. 5.  Atrial fibrillation patient was on anticoagulation with Eliquis.  This is now converted to Coumadin due to his acute kidney injury 6.  Diabetes mellitus type 2 per primary service. 7.  History of peripheral vascular disease status post redo right femoral pop bypass.   Will sign off patient today appears to be stable for discharge from my  standpoint.  Follow-up [name] 1 to 2 weeks time.    Sherril Croon 01/24/2020, 7:57 AM

## 2020-01-24 NOTE — TOC Initial Note (Signed)
Transition of Care [facility]) - Initial/Assessment Note    Patient Details  Name: Kyle [name] MRN: [id number] Date of Birth: [birth year]  [facility] CM/SW Contact:    Zenon Mayo, RN Phone Number: 01/24/2020, 12:10 PM  Clinical Narrative:                 NCM spoke with patient, he states he has transportation at dc, he has no issues with getting medications, he goes to [facility] on [address].  He has no other needs.   Expected Discharge Plan: Home/Self Care Barriers to Discharge: No Barriers Identified   Patient Goals and CMS Choice Patient states their goals for this hospitalization and ongoing recovery are:: to get better   Choice offered to / list presented to : NA  Expected Discharge Plan and Services Expected Discharge Plan: Home/Self Care In-house Referral: NA Discharge Planning Services: CM Consult   Living arrangements for the past 2 months: Single Family Home Expected Discharge Date: 01/24/20                 DME Agency: NA       HH Arranged: NA          Prior Living Arrangements/Services Living arrangements for the past 2 months: Single Family Home Lives with:: Spouse Patient language and need for interpreter reviewed:: Yes Do you feel safe going back to the place where you live?: Yes      Need for Family Participation in Patient Care: Yes (Comment) Care giver support system in place?: Yes (comment)   Criminal Activity/Legal Involvement Pertinent to Current Situation/Hospitalization: No - Comment as needed  Activities of Daily Living Home Assistive Devices/Equipment: CBG Meter, Dentures (specify type), Oxygen ADL Screening (condition at time of admission) Patient's cognitive ability adequate to safely complete daily activities?: Yes Is the patient deaf or have difficulty hearing?: No Does the patient have difficulty seeing, even when wearing glasses/contacts?: No Does the patient have difficulty concentrating,  remembering, or making decisions?: No Patient able to express need for assistance with ADLs?: Yes Does the patient have difficulty dressing or bathing?: Yes Independently performs ADLs?: Yes (appropriate for developmental age) Does the patient have difficulty walking or climbing stairs?: Yes Weakness of Legs: Both Weakness of Arms/Hands: Both  Permission Sought/Granted                  Emotional Assessment   Attitude/Demeanor/Rapport: Engaged Affect (typically observed): Appropriate Orientation: : Oriented to Self, Oriented to Place, Oriented to  Time, Oriented to Situation Alcohol / Substance Use: Not Applicable Psych Involvement: No (comment)  Admission diagnosis:  SOB (shortness of breath) [R06.02] Hypoxia [R09.02] Heart failure ([place]) [I50.9] Patient Active Problem List   Diagnosis Date Noted  . Hypoxia   . Heart failure ([place]) 01/20/2020  . SOB (shortness of breath)   . Acute metabolic encephalopathy   . Lactic acidosis 12/24/2019  . Uremia 12/24/2019  . High anion gap metabolic acidosis 78/29/5621  . Atrial fibrillation, chronic ([place]) 12/24/2019  . Diabetes mellitus type 2, insulin dependent ([place]) 12/24/2019  . Hyperkalemia, diminished renal excretion 12/24/2019  . Healthcare maintenance 07/14/2019  . Medication management 07/14/2019  . PAD (peripheral artery disease) ([place]) 04/22/2019  . Atherosclerosis of artery of extremity with rest pain ([place]) 04/21/2019  . COPD with chronic bronchitis and emphysema ([place]) 12/16/2018  . Chronic respiratory failure with hypoxia ([place]) 12/16/2018  . Acute renal failure ([place])   . Protein-calorie malnutrition, severe ([place]) 12/18/2014  . Aortoiliac occlusive  disease ([place]) 12/16/2014  . PVD (peripheral vascular disease) ([place]) 10/24/2014  . Aortic stenosis 09/12/2014  . 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 Pulmonary hypertension ([place]) 09/12/2014  . Dyslipidemia 09/12/2014  . Atherosclerosis of native arteries of  extremity with intermittent claudication ([place]) 05/11/2012  . Aortic insufficiency   . Hypertension   . Peripheral arterial disease ([place])   . Dilated cardiomyopathy ([place])   . Chronic systolic CHF (congestive heart failure) ([place])    PCP:  Alonna Buckler, MD Pharmacy:   [place], [place] - [address] [place] [place] [address] Phone: [phone] Fax: [facility] [place], [place] [address] Phone: [phone] Fax: [phone]  [facility] [place], [place] [address] [place] [address] Phone: [phone] Fax: [phone]     Social Determinants of Health ([place]) Interventions    Readmission Risk Interventions Readmission Risk Prevention Plan 01/24/2020 04/26/2019  Transportation Screening Complete Complete  PCP or Specialist Appt within 5-7 Days - Complete  Home Care Screening - Complete  Medication Review (RN CM) - Complete  Medication Review (RN Care Manager) Complete -  PCP or Specialist appointment within 3-5 days of discharge Complete -  [place] or Home Care Consult Complete -  SW Recovery Care/Counseling Consult Complete -  Palliative Care Screening Not Applicable -  [place] Not Applicable -  Some recent data might be hidden

## 2020-01-24 NOTE — Progress Notes (Signed)
Physical Therapy Treatment Patient Details Name: Kyle [name] MRN: [id number] DOB: Feb 04, 1951 Today's Date: 01/24/2020    History of Present Illness Pt adm 3/27 with acute on chronic respiratory failure and heart failure. Pt with recent admission for acute renal and respiratory failure. PMH - cardiomyopathy, copd on 5L, PAD, HTN, DM    PT Comments    Pt was seen for mobility and note his sats were more stable with higher O2 use.  Has needed 5L in room, on walk used 6 due to settings on tank.  Was 94% during walk and 95% after sitting down.  Refrained from conversation today to make sure his sats would support gait, and discussed follow up since PT is not ordered for him.  Will continue acutely with pt if dc is not completed today.   Follow Up Recommendations  No PT follow up;Supervision - Intermittent     Equipment Recommendations  None recommended by PT    Recommendations for Other Services       Precautions / Restrictions Precautions Precaution Comments: watch SpO2 Restrictions Weight Bearing Restrictions: No    Mobility  Bed Mobility Overal bed mobility: Modified Independent             General bed mobility comments: using bed rail to assist  Transfers Overall transfer level: Modified independent Equipment used: Rolling walker (2 wheeled) Transfers: Sit to/from Stand           General transfer comment: pt is able to stand with no assist, no signs of LOB  Ambulation/Gait Ambulation/Gait assistance: Supervision Gait Distance (Feet): 120 Feet Assistive device: Rolling walker (2 wheeled) Gait Pattern/deviations: Step-through pattern;Decreased stride length;Wide base of support Gait velocity: decreased   General Gait Details: 5L O2 on wall, used tank air with 6L due to settings with O2 sats 94% during gait with no conversation   Chief Strategy Officer    Modified Rankin (Stroke Patients Only)       Balance Overall balance  assessment: Needs assistance Sitting-balance support: Feet supported Sitting balance-Leahy Scale: Good     Standing balance support: Bilateral upper extremity supported;During functional activity Standing balance-Leahy Scale: Good Standing balance comment: fair with no AD                            Cognition Arousal/Alertness: Awake/alert Behavior During Therapy: WFL for tasks assessed/performed Overall Cognitive Status: Within Functional Limits for tasks assessed                                        Exercises      General Comments General comments (skin integrity, edema, etc.): pt sats were more controlled today but O2 use was higher      Pertinent Vitals/Pain Pain Assessment: No/denies pain    Home Living                      Prior Function            PT Goals (current goals can now be found in the care plan section) Acute Rehab PT Goals Patient Stated Goal: go home Progress towards PT goals: Progressing toward goals    Frequency    Min 3X/week      PT Plan Current plan remains appropriate    Co-evaluation  AM-PAC PT "6 Clicks" Mobility   Outcome Measure  Help needed turning from your back to your side while in a flat bed without using bedrails?: None Help needed moving from lying on your back to sitting on the side of a flat bed without using bedrails?: None Help needed moving to and from a bed to a chair (including a wheelchair)?: None Help needed standing up from a chair using your arms (e.g., wheelchair or bedside chair)?: None Help needed to walk in hospital room?: A Little Help needed climbing 3-5 steps with a railing? : A Little 6 Click Score: 22    End of Session Equipment Utilized During Treatment: Gait belt;Oxygen Activity Tolerance: Patient limited by fatigue Patient left: in bed;with call bell/phone within reach Nurse Communication: Mobility status PT Visit Diagnosis: Muscle  weakness (generalized) (M62.81);Difficulty in walking, not elsewhere classified (R26.2)     Time: 1610-9604 PT Time Calculation (min) (ACUTE ONLY): 19 min  Charges:  $Gait Training: 8-22 mins                  Ramond Dial 01/24/2020, 1:35 PM  Mee Hives, PT MS Acute Rehab Dept. Number: [place] and [place]

## 2020-01-25 DIAGNOSIS — R739 Hyperglycemia, unspecified: Secondary | ICD-10-CM

## 2020-01-25 HISTORY — DX: Hyperglycemia, unspecified: R73.9

## 2020-01-29 ENCOUNTER — Ambulatory Visit: Payer: Medicare HMO | Admitting: Pulmonary Disease

## 2020-01-29 ENCOUNTER — Other Ambulatory Visit: Payer: Self-pay

## 2020-01-29 ENCOUNTER — Encounter: Payer: Self-pay | Admitting: Pulmonary Disease

## 2020-01-29 ENCOUNTER — Inpatient Hospital Stay: Payer: Medicare HMO | Admitting: Pulmonary Disease

## 2020-01-29 VITALS — BP 126/70 | HR 75 | Temp 97.9°F | Ht 71.0 in | Wt 146.0 lb

## 2020-01-29 DIAGNOSIS — I272 Pulmonary hypertension, unspecified: Secondary | ICD-10-CM | POA: Diagnosis not present

## 2020-01-29 DIAGNOSIS — Z79899 Other long term (current) drug therapy: Secondary | ICD-10-CM

## 2020-01-29 DIAGNOSIS — Z7901 Long term (current) use of anticoagulants: Secondary | ICD-10-CM | POA: Insufficient documentation

## 2020-01-29 DIAGNOSIS — J9611 Chronic respiratory failure with hypoxia: Secondary | ICD-10-CM | POA: Diagnosis not present

## 2020-01-29 DIAGNOSIS — Z0000 Encounter for general adult medical examination without abnormal findings: Secondary | ICD-10-CM

## 2020-01-29 DIAGNOSIS — N170 Acute kidney failure with tubular necrosis: Secondary | ICD-10-CM

## 2020-01-29 DIAGNOSIS — J449 Chronic obstructive pulmonary disease, unspecified: Secondary | ICD-10-CM

## 2020-01-29 DIAGNOSIS — R918 Other nonspecific abnormal finding of lung field: Secondary | ICD-10-CM | POA: Insufficient documentation

## 2020-01-29 MED ORDER — ANORO ELLIPTA 62.5-25 MCG/INH IN AEPB: 1.0000 | INHALATION_SPRAY | Freq: Every day | RESPIRATORY_TRACT | 0 refills | Status: DC

## 2020-01-29 NOTE — Assessment & Plan Note (Signed)
Continue coumadin   Continue to follow up with [facility] coumadin clinic

## 2020-01-29 NOTE — Assessment & Plan Note (Signed)
Plan: Continue Anoro Ellipta Reapply for [place] for you Notify our office if you are unable to obtain access to Anoro Ellipta in January/2021 Follow-up with our office in 6 months

## 2020-01-29 NOTE — Assessment & Plan Note (Signed)
47mm RLL nodule   Plan:  Refer to lung cancer screening team for follow up ct imaging

## 2020-01-29 NOTE — Assessment & Plan Note (Signed)
Plan:  Schedule hosp follow up with [facility]phrology - [name]

## 2020-01-29 NOTE — Assessment & Plan Note (Signed)
Plan: Walk today in office -tolerated walk in office with no oxygen Maintain oxygen saturations greater than 90%

## 2020-01-29 NOTE — Assessment & Plan Note (Signed)
Plan: Walk today-patient tolerated walk today with no oxygen desaturations, on 3L Maintain oxygen saturations greater than 90% Purchase pulse oximeter

## 2020-01-29 NOTE — Assessment & Plan Note (Signed)
Plan:  Ok to get covid shot

## 2020-01-29 NOTE — Patient Instructions (Addendum)
You were seen today by Lauraine Rinne, NP  for:   1. COPD with chronic bronchitis and emphysema (HCC)  Anoro Ellipta  >>> Take 1 puff daily in the morning right when you wake up >>>Rinse your mouth out after use >>>This is a daily maintenance inhaler, NOT a rescue inhaler >>>Contact our office if you are having difficulties affording or obtaining this medication >>>It is important for you to be able to take this daily and not miss any doses    Note your daily symptoms > remember "red flags" for COPD:   >>>Increase in cough >>>increase in sputum production >>>increase in shortness of breath or activity  intolerance.   If you notice these symptoms, please call the office to be seen.   Walk today in office  2. Chronic respiratory failure with hypoxia (HCC)  Continue oxygen therapy as prescribed - 3L with exertion  >>>maintain oxygen saturations greater than 88 percent  >>>if unable to maintain oxygen saturations please contact the office  >>>do not smoke with oxygen  >>>can use nasal saline gel or nasal saline rinses to moisturize nose if oxygen causes dryness  Walk today in office next Please purchase a pulse oximeter to monitor oxygen levels at home, goal should be oxygen saturations above 88%  3. Medication management  Anoro samples today   Can apply for [employer] for you when you spend over 600 dollars out of pocket on medications this year   4. Pulmonary hypertension ([place])  Walk today in office  Continue to work with cardiology and nephrology team  5. SOB (shortness of breath)  6. Healthcare maintenance  Ok to get covid shot   7. Acute renal failure with tubular necrosis ([place])  Continue follow up with [name]   Please schedule a follow up with them   Please ask them about your referral to the coumadin clinic    Follow Up:    Return in about 6 weeks (around 03/11/2020), or if symptoms worsen or fail to improve, for Follow up with Dr. Elsworth Soho, With Chest  Xray.   Please do your part to reduce the spread of COVID-19:      Reduce your risk of any infection  and COVID19 by using the similar precautions used for avoiding the common cold or flu:  Marland Kitchen Wash your hands often with soap and warm water for at least 20 seconds.  If soap and water are not readily available, use an alcohol-based hand sanitizer with at least 60% alcohol.  . If coughing or sneezing, cover your mouth and nose by coughing or sneezing into the elbow areas of your shirt or coat, into a tissue or into your sleeve (not your hands). Langley Gauss A MASK when in public  . Avoid shaking hands with others and consider head nods or verbal greetings only. . Avoid touching your eyes, nose, or mouth with unwashed hands.  . Avoid close contact with people who are sick. . Avoid places or events with large numbers of people in one location, like concerts or sporting events. . If you have some symptoms but not all symptoms, continue to monitor at home and seek medical attention if your symptoms worsen. . If you are having a medical emergency, call 911.   [place] / e-Visit: [email or web address]         [facility]: [facility]: [phone]  [facility]: [phone]     It is flu season:   >>> Best ways to protect herself from the flu: Receive the yearly flu vaccine, practice good hand hygiene washing with soap and also using hand sanitizer when available, eat a nutritious meals, get adequate rest, hydrate appropriately   Please contact the office if your symptoms worsen or you have concerns that you are not improving.   Thank you for choosing [facility] for your healthcare, and for allowing [place] to partner with you on your healthcare journey. I am thankful to be able to provide care to you today.   [name] FNP-C

## 2020-01-29 NOTE — Progress Notes (Signed)
@Patient  ID: Kyle [name], male    DOB: [birth year], 69 y.o.   MRN: [id number]  Chief Complaint  Patient presents with  . Follow-up    hospital follow up.  sob with exertion at times.  no cough.      Referring provider: Alonna Buckler, MD  HPI:  69 year old male former smoker followed in our office for COPD  PMH: Hypertension, dilated cardiomyopathy, CHF, pulmonary hypertension, dyslipidemia Smoker/ Smoking History: Former smoker.  Quit smoking December/2019.  40-pack-year smoking history. Maintenance:  Anoro Ellipta  Pt of: Dr. Elsworth Soho  01/29/2020  - Visit   69 year old male former smoker followed in our office for COPD.  Patient presenting to office today as a hospital follow-up.  He was hospitalized on 01/20/2020.  He was discharged on 01/24/2020.  Patient was admitted with a heart failure exacerbation due to Lasix being discontinued after severe AKI with recent hospital stay.  Patient was discharged from the hospital on 01/24/2020.  Patient expected to have follow-up with primary care as well as [facility] within the next 1 to 2 weeks.  On arrival to our office today patient's oxygen saturations were 77% on room air.  Patient had doubly placed on oxygen.  Questionaires / Pulmonary Flowsheets:   MMRC: mMRC Dyspnea Scale mMRC Score  01/29/2020 2  10/13/2019 0  07/14/2019 0   Tests:   11/26/2018-CTA chest-negative for PE, trace pleural effusions, atelectasis in bases right greater than left  11/20/2018-CT head without contrast- sinuses normal  12/16/2018-spirometry- FVC 1.7 (43% predicted), ratio 64, FEV1 1.1 (36% predicted), consistent with severe airway obstruction  11/21/2018-echocardiogram-LV ejection fraction 40 to 45%, diffuse hypokinesis, right ventricle moderately dilated, systolic function was moderately reduced, PA P pressure 53   FENO:  No results found for: NITRICOXIDE  PFT: No flowsheet data found.  WALK:  SIX MIN WALK 10/13/2019 07/14/2019  05/15/2019 12/16/2018  Supplimental Oxygen during Test? (L/min) No Yes Yes Yes  O2 Flow Rate - 2 2 4   Type - Continuous Continuous Continuous  Tech Comments: Patient was able to complete 2 laps at a moderate pace. Denied any SOB or chest pain. No O2 needed during or after walk. Patient was able to complete the first lap without  O2. He started the 2nd lap and O2 dropped to 83%, HR 81. He was placed on 2L, recovered to 95% HR75. Patient was able to finish lap at 97% HR82. Denied any SOB, chest or leg pain. - slower pace due to unsteady gait.  Patient began off O2 but desaturated at about 1/2 a lap to 85%.  O2 titrated from 2L to 4L to maintain SpO2 above 90%.  Completed walk on 4L O2.  Mild to Mod SOB noted most of the walk.  [name] RN    Imaging: DG Chest 1 View  Result Date: 01/07/2020 CLINICAL DATA:  COPD EXAM: CHEST  1 VIEW COMPARISON:  12/25/2019 FINDINGS: Mild cardiomegaly. Large-bore right neck multi lumen vascular catheter. Mild, diffuse interstitial pulmonary opacity. Probable small, persistent layering pleural effusions. Underlying emphysema. The visualized skeletal structures are unremarkable. IMPRESSION: 1. Mild cardiomegaly with mild interstitial pulmonary edema and effusions. 2.  Emphysema (ICD10-J43.9). Electronically Signed   By: Eddie Candle M.D.   On: 01/07/2020 11:51   DG Elbow 2 Views Left  Result Date: 01/04/2020 CLINICAL DATA:  Left elbow pain and swelling.  No known injury. EXAM: LEFT ELBOW - 2 VIEW COMPARISON:  None. FINDINGS: There is no evidence of fracture, dislocation, or  joint effusion. There is no evidence of arthropathy or other focal bone abnormality. Soft tissues are unremarkable. IMPRESSION: Negative. Electronically Signed   By: Kerby Moors M.D.   On: 01/04/2020 11:40   IR Removal Tun Cv Cath W/O FL  Result Date: 01/11/2020 INDICATION: Patient with acute on chronic kidney disease requiring short initiation of hemodialysis via right tunneled dialysis  catheter placed 12/28/2019 by Dr. Earleen Newport. His kidney function has improved and he is no longer in need of his dialysis catheter. Request is made for removal. EXAM: REMOVAL TUNNELED CENTRAL VENOUS CATHETER MEDICATIONS: None COMPLICATIONS: None immediate. PROCEDURE: Informed written consent was obtained from the patient after a thorough discussion of the procedural risks, benefits and alternatives. All questions were addressed. Maximal Sterile Barrier Technique was utilized including caps, mask, sterile gowns, sterile gloves, sterile drape, hand hygiene and skin antiseptic. A timeout was performed prior to the initiation of the procedure. The patient's right chest and catheter was prepped and draped in a normal sterile fashion. Heparin was removed from both ports of catheter. 1% lidocaine was used for local anesthesia. Using gentle blunt dissection the cuff of the catheter was exposed and the catheter was removed in its entirety. Pressure was held till hemostasis was obtained. A sterile dressing was applied. The patient tolerated the procedure well with no immediate complications. IMPRESSION: Successful catheter removal as described above. Read by: Brynda Greathouse PA-C Electronically Signed   By: Sandi Mariscal M.D.   On: 01/11/2020 13:24   DG CHEST PORT 1 VIEW  Result Date: 01/23/2020 CLINICAL DATA:  Hypoxia, heart failure exacerbation, history COPD, dilated cardiomyopathy, CHF, hypertension EXAM: PORTABLE CHEST 1 VIEW COMPARISON:  Portable exam 1217 hours compared to 01/22/2020 FINDINGS: Enlargement of cardiac silhouette. Atherosclerotic calcification aorta. Emphysematous changes with mild interstitial infiltrates at the lower lungs, slightly improved since previous exam. Mild LEFT basilar atelectasis. No pleural effusion or pneumothorax. Bones demineralized. IMPRESSION: Emphysematous changes with decreased interstitial infiltrates. Enlargement of cardiac silhouette with mild persistent LEFT basilar atelectasis.  Electronically Signed   By: Lavonia Dana M.D.   On: 01/23/2020 12:34   DG CHEST PORT 1 VIEW  Result Date: 01/22/2020 CLINICAL DATA:  69 year old male with history of hypoxia. History of pulmonary edema. EXAM: PORTABLE CHEST 1 VIEW COMPARISON:  Chest x-ray 01/21/2020. FINDINGS: There is cephalization of the pulmonary vasculature and slight indistinctness of the interstitial markings suggestive of mild pulmonary edema. No definite pleural effusions. Mild cardiomegaly. Upper mediastinal contours are within normal limits. IMPRESSION: 1. Slight worsening of congestive heart failure, as above. Electronically Signed   By: Vinnie Langton M.D.   On: 01/22/2020 12:16   Portable chest 1 View  Result Date: 01/21/2020 CLINICAL DATA:  COPD, ischemic cardiomyopathy EXAM: PORTABLE CHEST 1 VIEW COMPARISON:  01/20/2020 FINDINGS: Bilateral diffuse interstitial thickening. No pleural effusion or pneumothorax. Stable cardiomediastinal silhouette. No acute osseous abnormality. IMPRESSION: Mild CHF. Electronically Signed   By: Kathreen Devoid   On: 01/21/2020 09:33   DG Chest Port 1 View  Result Date: 01/20/2020 CLINICAL DATA:  Shortness of breath since Thursday EXAM: PORTABLE CHEST 1 VIEW COMPARISON:  January 07, 2020 FINDINGS: The mediastinal contour is normal. The heart size is enlarged. Mild increased pulmonary interstitium is identified bilaterally. Lungs are hyperinflated. The bony structures are stable. IMPRESSION: Mild congestive heart failure. Electronically Signed   By: Abelardo Diesel M.D.   On: 01/20/2020 14:41    Lab Results:  CBC    Component Value Date/Time   WBC 7.9 01/24/2020 0429  RBC 3.47 (L) 01/24/2020 0429   HGB 9.9 (L) 01/24/2020 0429   HCT 31.8 (L) 01/24/2020 0429   PLT 404 (H) 01/24/2020 0429   MCV 91.6 01/24/2020 0429   MCH 28.5 01/24/2020 0429   MCHC 31.1 01/24/2020 0429   RDW 17.5 (H) 01/24/2020 0429   LYMPHSABS 1.7 01/24/2020 0429   MONOABS 1.3 (H) 01/24/2020 0429   EOSABS 0.2  01/24/2020 0429   BASOSABS 0.0 01/24/2020 0429    BMET    Component Value Date/Time   NA 136 01/24/2020 0429   NA 143 11/30/2019 0937   K 3.7 01/24/2020 0429   CL 99 01/24/2020 0429   CO2 24 01/24/2020 0429   GLUCOSE 100 (H) 01/24/2020 0429   BUN 40 (H) 01/24/2020 0429   BUN 21 11/30/2019 0937   CREATININE 2.52 (H) 01/24/2020 0429   CREATININE 0.99 11/12/2014 1344   CALCIUM 8.7 (L) 01/24/2020 0429   GFRNONAA 25 (L) 01/24/2020 0429   GFRAA 29 (L) 01/24/2020 0429    BNP    Component Value Date/Time   BNP 138.7 (H) 01/20/2020 1439   BNP 997.9 (H) 11/09/2014 1333    ProBNP    Component Value Date/Time   PROBNP 483.0 (H) 10/19/2008 0328    Specialty Problems      Pulmonary Problems   Chronic respiratory failure with hypoxia (HCC)   COPD with chronic bronchitis and emphysema (HCC)    Severe -FEV1 36%      SOB (shortness of breath)   Hypoxia      No Known Allergies  Immunization History  Administered Date(s) Administered  . Fluad Quad(high Dose 65+) 07/14/2019  . Influenza-Unspecified 01/26/2017, 08/10/2017  . Pneumococcal Conjugate-13 01/20/2018  . Pneumococcal Polysaccharide-23 07/14/2019    Past Medical History:  Diagnosis Date  . Aortic insufficiency    MODERATE WITH A BICUSPID AORTIC VAVLE  . Arterial occlusive disease    MULTILEVEL  . CHF (congestive heart failure) ([place])   . COPD (chronic obstructive pulmonary disease) ([place])   . Dilated cardiomyopathy (HCC)    WITH EJECTION FRACTION DOWN TO 20-25%--WITH CONGESTIVE HEART FAILURE  . Edema    LOWER EXTREMETIES  . Hypertension   . Normal coronary arteries 2009  . Orthopnea   . Peripheral arterial disease ([place])   . Pulmonary hypertension ([place])   . SOB (shortness of breath)     Tobacco History: Social History   Tobacco Use  Smoking Status Former Smoker  . Packs/day: 1.00  . Years: 40.00  . Pack years: 40.00  . Quit date: 10/15/2018  . Years since quitting: 1.2  Smokeless Tobacco Never  Used  Tobacco Comment   quit 2010 and quit again 2019   Counseling given: Yes Comment: quit 2010 and quit again 2019  Continue to not smoke  Outpatient Encounter Medications as of 01/29/2020  Medication Sig  . ACCU-CHEK FASTCLIX LANCETS MISC   . Accu-Chek FastClix Lancets MISC USE TO MONITOR BLOOD GLUCOSE 2 TIMES DAILY  . ACCU-CHEK GUIDE test strip   . amLODipine (NORVASC) 5 MG tablet Take 1 tablet (5 mg total) by mouth daily.  Jearl Klinefelter ELLIPTA 62.5-25 MCG/INH AEPB INHALE 1 PUFF BY MOUTH ONCE DAILY . APPOINTMENT REQUIRED FOR FUTURE REFILLS  . BD PEN NEEDLE NANO U/F 32G X 4 MM MISC   . carvedilol (COREG) 6.25 MG tablet Take 1 tablet (6.25 mg total) by mouth 2 (two) times daily with a meal.  . furosemide (LASIX) 20 MG tablet Take 1 tablet (20 mg total) by  mouth 2 (two) times daily.  . furosemide (LASIX) 80 MG tablet Take 1 tablet (80 mg total) by mouth 2 (two) times daily.  Marland Kitchen glucose blood (ACCU-CHEK GUIDE) test strip one strip (1 each dose) by Other route 2 (two) times daily. Test blood glucose 2 times a day.  Marland Kitchen guaiFENesin (MUCINEX) 600 MG 12 hr tablet Take 2 tablets (1,200 mg total) by mouth 2 (two) times daily. (Patient taking differently: Take 600 mg by mouth 2 (two) times daily. )  . insulin glargine (LANTUS) 100 UNIT/ML injection Inject 0.06 mLs (6 Units total) into the skin at bedtime.  . Insulin Pen Needle (BD PEN NEEDLE NANO U/F) 32G X 4 MM MISC USE TO INJECT INTO THE SKIN DAILY.  Marland Kitchen levETIRAcetam (KEPPRA) 500 MG tablet Take 1 tablet (500 mg total) by mouth 2 (two) times daily.  . Multiple Vitamin (MULTIVITAMIN WITH MINERALS) TABS tablet Take 1 tablet by mouth daily.  . OXYGEN Inhale 4.5 L into the lungs continuous.  . rosuvastatin (CRESTOR) 10 MG tablet Take 1 tablet (10 mg total) by mouth daily at 6 PM.  . umeclidinium-vilanterol (ANORO ELLIPTA) 62.5-25 MCG/INH AEPB Inhale 1 puff into the lungs daily.  Marland Kitchen warfarin (COUMADIN) 2 MG tablet Take 1 tablet (2 mg total) by mouth daily at 6  PM.  . [EXPIRED] Nutritional Supplements (FEEDING SUPPLEMENT, NEPRO CARB STEADY,) LIQD Take 237 mLs by mouth 3 (three) times daily between meals for 15 days.   No facility-administered encounter medications on file as of 01/29/2020.     Review of Systems  Review of Systems  Constitutional: Positive for fatigue. Negative for activity change, chills, fever and unexpected weight change.  HENT: Negative for postnasal drip, rhinorrhea, sinus pressure, sinus pain and sore throat.   Eyes: Negative.   Respiratory: Positive for shortness of breath. Negative for cough and wheezing.   Cardiovascular: Negative for chest pain and palpitations.  Gastrointestinal: Negative for constipation, diarrhea, nausea and vomiting.  Endocrine: Negative.   Genitourinary: Negative.   Musculoskeletal: Negative.   Skin: Negative.   Neurological: Negative for dizziness and headaches.  Psychiatric/Behavioral: Negative.  Negative for dysphoric mood. The patient is not nervous/anxious.   All other systems reviewed and are negative.    Physical Exam  BP 126/70 (BP Location: Left Arm, Cuff Size: Normal)   Pulse 75   Temp 97.9 F (36.6 C) (Oral)   Ht 5\' 11"  (1.803 m)   Wt 146 lb (66.2 kg)   SpO2 100% Comment: 3L [place]  BMI 20.36 kg/m   Wt Readings from Last 5 Encounters:  01/29/20 146 lb (66.2 kg)  01/24/20 144 lb 8 oz (65.5 kg)  01/10/20 168 lb 10.4 oz (76.5 kg)  11/30/19 158 lb (71.7 kg)  10/13/19 151 lb (68.5 kg)    BMI Readings from Last 5 Encounters:  01/29/20 20.36 kg/m  01/24/20 20.15 kg/m  01/10/20 23.52 kg/m  11/30/19 22.04 kg/m  10/13/19 21.67 kg/m     Physical Exam Vitals and nursing note reviewed.  Constitutional:      General: He is not in acute distress.    Appearance: Normal appearance. He is normal weight.  HENT:     Head: Normocephalic and atraumatic.     Right Ear: Hearing and external ear normal. There is no impacted cerumen.     Left Ear: Hearing and external ear normal.  There is no impacted cerumen.     Nose: No mucosal edema.     Right Turbinates: Not enlarged.  Left Turbinates: Not enlarged.     Mouth/Throat:     Mouth: Mucous membranes are dry.     Pharynx: Oropharynx is clear. No oropharyngeal exudate.  Eyes:     Pupils: Pupils are equal, round, and reactive to light.  Cardiovascular:     Rate and Rhythm: Normal rate and regular rhythm.     Pulses: Normal pulses.     Heart sounds: Normal heart sounds. No murmur.  Pulmonary:     Effort: Pulmonary effort is normal.     Breath sounds: Normal breath sounds. No decreased breath sounds, wheezing or rales.  Abdominal:     General: Abdomen is flat. There is no distension.     Palpations: Abdomen is soft.     Tenderness: There is no abdominal tenderness.  Musculoskeletal:     Cervical back: Normal range of motion.     Right lower leg: No edema.     Left lower leg: No edema.  Lymphadenopathy:     Cervical: No cervical adenopathy.  Skin:    General: Skin is warm and dry.     Capillary Refill: Capillary refill takes less than 2 seconds.     Findings: No erythema or rash.  Neurological:     General: No focal deficit present.     Mental Status: He is alert and oriented to person, place, and time.     Motor: No weakness.     Coordination: Coordination normal.     Gait: Gait is intact. Gait (tolerated walk in office today ) normal.  Psychiatric:        Mood and Affect: Mood normal.        Behavior: Behavior normal. Behavior is cooperative.        Thought Content: Thought content normal.        Judgment: Judgment normal.       Assessment & Plan:   Pulmonary hypertension ([place]) Plan: Walk today in office -tolerated walk in office with no oxygen Maintain oxygen saturations greater than 90%  Chronic respiratory failure with hypoxia (HCC) Plan: Walk today-patient tolerated walk today with no oxygen desaturations, on 3L Maintain oxygen saturations greater than 90% Purchase pulse oximeter   COPD with chronic bronchitis and emphysema (HCC) Plan: Continue Anoro Ellipta Reapply for [employer] for you Notify our office if you are unable to obtain access to Anoro Ellipta in January/2021 Follow-up with our office in 6 months  Acute renal failure ([place]) Plan:  Schedule hosp follow up with Nephrology - [facility]   Healthcare maintenance Plan:  Ok to get covid shot   Medication management Anoro samples today   Abnormal findings on diagnostic imaging of lung 22mm RLL nodule   Plan:  Refer to lung cancer screening team for follow up ct imaging  Anticoagulant long-term use Continue coumadin   Continue to follow up with [facility]    Return in about 6 weeks (around 03/11/2020), or if symptoms worsen or fail to improve, for Follow up with Dr. Elsworth Soho, With Chest Xray.   Lauraine Rinne, NP 01/29/2020   This appointment required 32 minutes of patient care (this includes precharting, chart review, review of results, face-to-face care, etc.).

## 2020-01-29 NOTE — Assessment & Plan Note (Signed)
Anoro samples today

## 2020-01-29 NOTE — Addendum Note (Signed)
Addended by: Vanessa Barbara on: 01/29/2020 05:15 PM   Modules accepted: Orders

## 2020-02-06 ENCOUNTER — Other Ambulatory Visit: Payer: Self-pay | Admitting: Cardiology

## 2020-02-12 NOTE — Progress Notes (Deleted)
Kyle [name] Date of Birth: 06-08-1951 Medical Record #[id number]  History of Present Illness: Kyle [name] is seen for follow up CHF.  He has a history of systolic CHF with EF of 68-12% in May 2012. Coronary anatomy was normal by cath. He has a bicuspid AV with moderate AS/AI. He had moderate pulmonary HTN. He was treated medically and one of my old notes stated that his LV function showed marked improvement with  Echo in 2011 showing improvement in EF to 55-60%.    Unfortunately , he was lost to follow up and was not taking medication for over 2 years. He also has a history of HTN, hyperlipidemia, and PAD s/p right fem-pop BPG.   When seen in October 2015 he had evidence of recurrent CHF with severe HTN. Echo showed an EF of 20-25% with moderate AS/AI. He was started back on medication with lasix, carvedilol, and quinapril. He did not come back for follow up as instructed. He was seen in Jan. 2016 for surgical clearance for vascular surgery.  He was seen by Kyle [name] for claudication and was found to have severe PAD. Revascularization recommended. He underwent aortobifemoral BPG in February 2016. Last seen by me in April 2019- had missed multiple appointments. He did have an Echo in July 2018 showing normalization of LV function with EF 55%.  He is  diabetic and on insulin.  He was admitted in January 2020 with hypotension and shock. Had malaise, N/V and sweats. Treated with antibiotics. Had respiratory failure with hypoxia. Sent home with oxygen. He had Afib initially then converted to NSR. Started on Eliquis. Continued on Coreg. Had AKI with creatinine up to 2.7 that improved to 1.03. LFTs were elevated. Echo showed EF 40-45%. There was RV dysfunction with moderate pulmonary HTN. Moderate AI with mild AS. CT negative for PE but showed advanced emphysema.   He presented in June 2020 with progressive right leg pain and nonhealing wound on his foot. Angiography showed Occluded graft to the right  leg and only peroneal run off. He underwent the following surgery on 04/24/19 by Dr Kyle [name]. Right femoral and repair with interposition 8 mm Hemashield graft from prior right limb of aortofemoral bypass into into deep femoral artery, #2 right femoral to peroneal bypass with translocated non-reversed great saphenous vein harvested from the left leg. He has not followed up with vascular surgery or cardiology post surgery. Has only followed up with pulmonary for COPD with hypoxemia and pulmonary HTN.   Repeat Echo in Feb 2021 showed normal EF with severe LVH and mild to mod AS. Mod AI. Pulmonary HTN with estimated systolic pressure 63 mm hg.   He was admitted 2/28-3/18/21 with N/V and diarrhea with one month duration. He had acute renal failure. He had acute encephalopathy due to uremia. Acute respiratory failure requiring ventilator support for 24 hours. He was dialysed. Improved. Dialysis catheter able to be removed.  Eliquis was switched to Coumadin due to renal failure. Lasix was held. He was readmitted 3/27-3/31/21 with acute on chronic CHF. Lasix resumed IV and later transitioned to po. Maintained on Coreg and amlodipine.   On follow up today he reports he is feeling well. Wears oxygen intermittently at home. Has not smoked in 2 years. Thinks breathing is better. No edema. No chest pain. Denies any foot pain or claudication.   Allergies as of 02/20/2020   No Known Allergies     Medication List       Accurate as of February 12, 2020  8:44 AM. If you have any questions, ask your nurse or doctor.        Accu-Chek FastClix Lancets Misc   Accu-Chek FastClix Lancets Misc USE TO MONITOR BLOOD GLUCOSE 2 TIMES DAILY   Accu-Chek Guide test strip Generic drug: glucose blood   Accu-Chek Guide test strip Generic drug: glucose blood one strip (1 each dose) by Other route 2 (two) times daily. Test blood glucose 2 times a day.   amLODipine 5 MG tablet Commonly known as: NORVASC TAKE 1 TABLET EVERY  DAY   Anoro Ellipta 62.5-25 MCG/INH Aepb Generic drug: umeclidinium-vilanterol INHALE 1 PUFF BY MOUTH ONCE DAILY . APPOINTMENT REQUIRED FOR FUTURE REFILLS   Anoro Ellipta 62.5-25 MCG/INH Aepb Generic drug: umeclidinium-vilanterol Inhale 1 puff into the lungs daily.   Anoro Ellipta 62.5-25 MCG/INH Aepb Generic drug: umeclidinium-vilanterol Inhale 1 puff into the lungs daily.   BD Pen Needle Nano U/F 32G X 4 MM Misc Generic drug: Insulin Pen Needle   BD Pen Needle Nano U/F 32G X 4 MM Misc Generic drug: Insulin Pen Needle USE TO INJECT INTO THE SKIN DAILY.   carvedilol 6.25 MG tablet Commonly known as: COREG Take 1 tablet (6.25 mg total) by mouth 2 (two) times daily with a meal.   furosemide 20 MG tablet Commonly known as: LASIX Take 1 tablet (20 mg total) by mouth 2 (two) times daily.   furosemide 80 MG tablet Commonly known as: LASIX Take 1 tablet (80 mg total) by mouth 2 (two) times daily.   guaiFENesin 600 MG 12 hr tablet Commonly known as: MUCINEX Take 2 tablets (1,200 mg total) by mouth 2 (two) times daily. What changed: how much to take   insulin glargine 100 UNIT/ML injection Commonly known as: LANTUS Inject 0.06 mLs (6 Units total) into the skin at bedtime.   levETIRAcetam 500 MG tablet Commonly known as: KEPPRA Take 1 tablet (500 mg total) by mouth 2 (two) times daily.   multivitamin with minerals Tabs tablet Take 1 tablet by mouth daily.   OXYGEN Inhale 4.5 L into the lungs continuous.   rosuvastatin 10 MG tablet Commonly known as: CRESTOR Take 1 tablet (10 mg total) by mouth daily at 6 PM.   warfarin 2 MG tablet Commonly known as: COUMADIN Take 1 tablet (2 mg total) by mouth daily at 6 PM.        No Known Allergies  Past Medical History:  Diagnosis Date  . Aortic insufficiency    MODERATE WITH A BICUSPID AORTIC VAVLE  . Arterial occlusive disease    MULTILEVEL  . CHF (congestive heart failure) ([place])   . COPD (chronic obstructive  pulmonary disease) ([place])   . Dilated cardiomyopathy ([facility])    WITH EJECTION FRACTION DOWN TO 20-25%--WITH CONGESTIVE HEART FAILURE  . Edema    LOWER EXTREMETIES  . Hypertension   . Normal coronary arteries 2009  . Orthopnea   . Peripheral arterial disease ([place])   . Pulmonary hypertension ([place])   . SOB (shortness of breath)     Past Surgical History:  Procedure Laterality Date  . ABDOMINAL AORTAGRAM N/A 11/05/2014   Procedure: ABDOMINAL Maxcine Ham;  Surgeon: Angelia Mould, MD;  Location: [facility] CATH LAB;  Service: Cardiovascular;  Laterality: N/A;  . ABDOMINAL AORTOGRAM W/LOWER EXTREMITY Bilateral 04/21/2019   Procedure: ABDOMINAL AORTOGRAM W/LOWER EXTREMITY;  Surgeon: Angelia Mould, MD;  Location: [place] CV LAB;  Service: Cardiovascular;  Laterality: Bilateral;  . AORTA - BILATERAL FEMORAL ARTERY BYPASS GRAFT N/A  12/18/2014   Procedure: AORTOBIFEMORAL BYPASS GRAFT;  Surgeon: Angelia Mould, MD;  Location: [place];  Service: Vascular;  Laterality: N/A;  . CARDIAC CATHETERIZATION  2009   Nl Cors, EF 20%  . COLONOSCOPY    . ENDARTERECTOMY FEMORAL Left 12/18/2014   Procedure: ENDARTERECTOMY FEMORAL;  Surgeon: Angelia Mould, MD;  Location: [place];  Service: Vascular;  Laterality: Left;  . FALSE ANEURYSM REPAIR Right 04/24/2019   Procedure: REPAIR OF RIGHT FEMORAL ARTERY PSEUDOANEURYSM;  Surgeon: Rosetta Posner, MD;  Location: [place];  Service: Vascular;  Laterality: Right;  . FEMORAL-POPLITEAL BYPASS GRAFT  02/21/2011   right  . FEMORAL-TIBIAL BYPASS GRAFT Right 04/24/2019   Procedure: REDO BYPASS GRAFT RIGHT FEMORAL-TIBIAL ARTERY USING LEFT LEG VEIN & HEMASHIELD GOLD 29mm GRAFT;  Surgeon: Rosetta Posner, MD;  Location: [place];  Service: Vascular;  Laterality: Right;  . IR FLUORO GUIDE CV LINE RIGHT  12/28/2019  . IR REMOVAL TUN CV CATH W/O FL  01/11/2020  . IR US GUIDE VASC ACCESS RIGHT  12/28/2019  . OTHER SURGICAL HISTORY     POST RIGHT FEMOROPOPLITEAL BYPASS GRAFT  .  PERIPHERAL VASCULAR CATHETERIZATION  11/05/2014   Procedure: LOWER EXTREMITY ANGIOGRAPHY;  Surgeon: Angelia Mould, MD;  Location: [facility] CATH LAB;  Service: Cardiovascular;;  . VEIN HARVEST Left 04/24/2019   Procedure: LEFT LEG SAPHENOUS VEIN HARVEST;  Surgeon: Rosetta Posner, MD;  Location: [facility] OR;  Service: Vascular;  Laterality: Left;    Social History   Socioeconomic History  . Marital status: Married    Spouse name: Not on file  . Number of children: 1  . Years of education: Not on file  . Highest education level: Not on file  Occupational History  . Occupation: retired    Fish farm manager: [employer]  Tobacco Use  . Smoking status: Former Smoker    Packs/day: 1.00    Years: 40.00    Pack years: 40.00    Quit date: 10/15/2018    Years since quitting: 1.3  . Smokeless tobacco: Never Used  . Tobacco comment: quit 2010 and quit again 2019  Substance and Sexual Activity  . Alcohol use: No    Alcohol/week: 0.0 standard drinks  . Drug use: No  . Sexual activity: Not on file  Other Topics Concern  . Not on file  Social History Narrative  . Not on file   Social Determinants of Health   Financial Resource Strain:   . Difficulty of Paying Living Expenses:   Food Insecurity:   . Worried About Charity fundraiser in the Last Year:   . Arboriculturist in the Last Year:   Transportation Needs:   . Film/video editor (Medical):   Marland Kitchen Lack of Transportation (Non-Medical):   Physical Activity:   . Days of Exercise per Week:   . Minutes of Exercise per Session:   Stress:   . Feeling of Stress :   Social Connections:   . Frequency of Communication with Friends and Family:   . Frequency of Social Gatherings with Friends and Family:   . Attends Religious Services:   . Active Member of Clubs or Organizations:   . Attends Archivist Meetings:   Marland Kitchen Marital Status:     Family History  Problem Relation Age of Onset  . Diabetes Mother   . Hyperlipidemia Sister   .  Hypertension Sister     Review of Systems: As noted in HPI.   All  other systems were reviewed and are negative.  Physical Exam: There were no vitals taken for this visit. There were no vitals filed for this visit.  GENERAL:  Well appearing, thin BM in NAD HEENT:  PERRL, EOMI, sclera are clear. Oropharynx is clear. NECK:  No jugular venous distention, carotid upstroke brisk and symmetric, no bruits, no thyromegaly or adenopathy LUNGS:  Clear to auscultation bilaterally CHEST:  Unremarkable HEART:  RRR,  PMI not displaced or sustained,S1 and S2 within normal limits, no S3, no S4: no clicks, no rubs, gr 1/6 systolic and diastolic murmur RUSB.  ABD:  Soft, nontender. BS +, no masses or bruits. No hepatomegaly, no splenomegaly EXT:  Pedal pulses are weak.   no edema, no cyanosis no clubbing SKIN:  Warm and dry.  No rashes NEURO:  Alert and oriented x 3. Cranial nerves II through XII intact. PSYCH:  Cognitively intact   LABORATORY DATA: . Lab Results  Component Value Date   WBC 7.9 01/24/2020   HGB 9.9 (L) 01/24/2020   HCT 31.8 (L) 01/24/2020   PLT 404 (H) 01/24/2020   GLUCOSE 100 (H) 01/24/2020   CHOL 166 11/30/2019   TRIG 137 11/30/2019   HDL 41 11/30/2019   LDLCALC 101 (H) 11/30/2019   ALT 16 01/24/2020   AST 15 01/24/2020   NA 136 01/24/2020   K 3.7 01/24/2020   CL 99 01/24/2020   CREATININE 2.52 (H) 01/24/2020   BUN 40 (H) 01/24/2020   CO2 24 01/24/2020   TSH 3.244 08/20/2014   INR 2.1 (H) 01/24/2020   HGBA1C 8.0 (H) 12/24/2019    Labs dated 07/28/17: BUN 29, creatinine 1.03. Other chemistries normal. A1c 6.2%. Cholesterol 137, Triglycerides 63, HDL 43, LDL 81, TSH normal. Labs dated 12/21/17: cholesterol 123, triglycerides 64, HDL 41, LDL 69. A1c 6.3%. CMET and CBC normal. TSH normal.  Echo: 05/06/17:  Study Conclusions  - Left ventricle: The cavity size was normal. Wall thickness was   increased in a pattern of mild LVH. Systolic function was normal.   The  estimated ejection fraction was in the range of 50% to 55%.   Wall motion was normal; there were no regional wall motion   abnormalities. Doppler parameters are consistent with abnormal   left ventricular relaxation (grade 1 diastolic dysfunction). - Aortic valve: There was mild stenosis. There was mild to moderate   regurgitation. - Mitral valve: Calcified annulus. - Left atrium: The atrium was mildly dilated. - Right ventricle: The cavity size was mildly dilated. - Right atrium: The atrium was mildly dilated. - Pulmonary arteries: Systolic pressure was moderately increased.  Impressions:  - Normal LV systolic function; mild LVH; mild diastolic   dysfunction; calcified aortic valve with mild AS (mean gradient   18 mmHg) and mild to moderate AI; mild LAE; mild RAE and RVE;   trace TR with moderately elevated pulmonary pressure.  Echo 11/21/18: Study Conclusions  - Left ventricle: The cavity size was normal. Systolic function was   mildly to moderately reduced. The estimated ejection fraction was   in the range of 40% to 45%. Diffuse hypokinesis. The study is not   technically sufficient to allow evaluation of LV diastolic   function. - Ventricular septum: The contour showed diastolic flattening. - Aortic valve: Valve mobility was restricted. There was mild   stenosis. There was moderate regurgitation. - Right ventricle: The cavity size was moderately dilated. Wall   thickness was normal. Systolic function was moderately reduced. - Right atrium: The atrium  was severely dilated. - Tricuspid valve: There was moderate regurgitation. - Pulmonary arteries: Systolic pressure was moderately increased.   PA peak pressure: 53 mm Hg (S).  Impressions:  - Compared to the prior study, there has been no significant   interval change.  Echo 12/13/19: IMPRESSIONS    1. Severe concentric LVH, consider evaluation for infiltrative  cardiomyopathies such as cardiac amyloidosis.  2.  Impaired GLS: -14.6%. Left ventricular ejection fraction, by  estimation, is 65 to 70%. The left ventricle has normal function. The left  ventricle has no regional wall motion abnormalities. There is severe  concentric left ventricular hypertrophy. Left  ventricular diastolic parameters are consistent with Grade I diastolic  dysfunction (impaired relaxation).  3. Right ventricular systolic function is normal. The right ventricular  size is normal. There is moderately elevated pulmonary artery systolic  pressure. The estimated right ventricular systolic pressure is 32.9 mmHg.  4. The mitral valve is normal in structure and function. No evidence of  mitral valve regurgitation. No evidence of mitral stenosis.  5. The aortic valve is normal in structure and function. Aortic valve  regurgitation is moderate. Mild to moderate aortic valve stenosis.  6. The inferior vena cava is normal in size with greater than 50%  respiratory variability, suggesting right atrial pressure of 3 mmHg.    Assessment / Plan: 1. Chronic systolic CHF.  He has a history of cardiomyopathy- nonischemic. EF normalized in past when compliant with medication. Most recent Echo showed drop in EF to 40-45% possibly related to sepsis and medication noncompliance.  Reinforced importance of compliance with medical therapy. Low sodium diet. Continue lasix, Coreg, Quinapril. Appears to be euvolemic. We will update Echo now.    2. HTN - BP is well controlled today.  3. PAD s/p redo right fem-pop bypass last June. S/p aortobifemoral BPG Feb. 2016. Recommend he arrange follow up with [facility].   4. Hyperlipidemia. on pravastatin. Enforce compliance. Will update fasting labs today.   5. Biscuspid AV with mild AS/moderate AI. Follow up Echo  6. Tobacco abuse- quit 2 years ago.   7. DM now on insulin per primary care.  8. Advanced emphysema. On home oxygen now. On Anoro Ellipta  9. Afib in setting of sepsis. In NSR now. Recommend  Eliquis to reduce risk of embolic phenomena.   10. CKD stage 3a  Follow up in 6 months.

## 2020-02-19 ENCOUNTER — Inpatient Hospital Stay (HOSPITAL_COMMUNITY)
Admission: EM | Admit: 2020-02-19 | Discharge: 2020-02-23 | DRG: 637 | Disposition: A | Discharge status: Home / Self Care | Payer: Medicare HMO | Attending: Internal Medicine | Admitting: Internal Medicine

## 2020-02-19 ENCOUNTER — Encounter (HOSPITAL_COMMUNITY): Payer: Self-pay | Admitting: *Deleted

## 2020-02-19 DIAGNOSIS — N183 Chronic kidney disease, stage 3 unspecified: Secondary | ICD-10-CM | POA: Diagnosis not present

## 2020-02-19 DIAGNOSIS — Z87891 Personal history of nicotine dependence: Secondary | ICD-10-CM | POA: Diagnosis not present

## 2020-02-19 DIAGNOSIS — Z794 Long term (current) use of insulin: Secondary | ICD-10-CM

## 2020-02-19 DIAGNOSIS — I352 Nonrheumatic aortic (valve) stenosis with insufficiency: Secondary | ICD-10-CM | POA: Diagnosis present

## 2020-02-19 DIAGNOSIS — Z83438 Family history of other disorder of lipoprotein metabolism and other lipidemia: Secondary | ICD-10-CM

## 2020-02-19 DIAGNOSIS — N1831 Chronic kidney disease, stage 3a: Secondary | ICD-10-CM

## 2020-02-19 DIAGNOSIS — E1100 Type 2 diabetes mellitus with hyperosmolarity without nonketotic hyperglycemic-hyperosmolar coma (NKHHC): Secondary | ICD-10-CM | POA: Diagnosis present

## 2020-02-19 DIAGNOSIS — I130 Hypertensive heart and chronic kidney disease with heart failure and stage 1 through stage 4 chronic kidney disease, or unspecified chronic kidney disease: Secondary | ICD-10-CM | POA: Diagnosis present

## 2020-02-19 DIAGNOSIS — N179 Acute kidney failure, unspecified: Secondary | ICD-10-CM | POA: Diagnosis present

## 2020-02-19 DIAGNOSIS — E119 Type 2 diabetes mellitus without complications: Secondary | ICD-10-CM

## 2020-02-19 DIAGNOSIS — E1101 Type 2 diabetes mellitus with hyperosmolarity with coma: Secondary | ICD-10-CM | POA: Diagnosis not present

## 2020-02-19 DIAGNOSIS — E860 Dehydration: Secondary | ICD-10-CM | POA: Diagnosis present

## 2020-02-19 DIAGNOSIS — I739 Peripheral vascular disease, unspecified: Secondary | ICD-10-CM | POA: Diagnosis not present

## 2020-02-19 DIAGNOSIS — Z6820 Body mass index (BMI) 20.0-20.9, adult: Secondary | ICD-10-CM | POA: Diagnosis not present

## 2020-02-19 DIAGNOSIS — I482 Chronic atrial fibrillation, unspecified: Secondary | ICD-10-CM | POA: Diagnosis present

## 2020-02-19 DIAGNOSIS — N184 Chronic kidney disease, stage 4 (severe): Secondary | ICD-10-CM | POA: Diagnosis present

## 2020-02-19 DIAGNOSIS — R739 Hyperglycemia, unspecified: Secondary | ICD-10-CM

## 2020-02-19 DIAGNOSIS — R112 Nausea with vomiting, unspecified: Secondary | ICD-10-CM

## 2020-02-19 DIAGNOSIS — I272 Pulmonary hypertension, unspecified: Secondary | ICD-10-CM | POA: Diagnosis present

## 2020-02-19 DIAGNOSIS — Z79899 Other long term (current) drug therapy: Secondary | ICD-10-CM

## 2020-02-19 DIAGNOSIS — Z9981 Dependence on supplemental oxygen: Secondary | ICD-10-CM

## 2020-02-19 DIAGNOSIS — E1165 Type 2 diabetes mellitus with hyperglycemia: Secondary | ICD-10-CM

## 2020-02-19 DIAGNOSIS — E876 Hypokalemia: Secondary | ICD-10-CM | POA: Diagnosis not present

## 2020-02-19 DIAGNOSIS — I350 Nonrheumatic aortic (valve) stenosis: Secondary | ICD-10-CM

## 2020-02-19 DIAGNOSIS — Z8249 Family history of ischemic heart disease and other diseases of the circulatory system: Secondary | ICD-10-CM

## 2020-02-19 DIAGNOSIS — E1122 Type 2 diabetes mellitus with diabetic chronic kidney disease: Secondary | ICD-10-CM

## 2020-02-19 DIAGNOSIS — I480 Paroxysmal atrial fibrillation: Secondary | ICD-10-CM | POA: Diagnosis present

## 2020-02-19 DIAGNOSIS — J9621 Acute and chronic respiratory failure with hypoxia: Secondary | ICD-10-CM | POA: Diagnosis present

## 2020-02-19 DIAGNOSIS — I5022 Chronic systolic (congestive) heart failure: Secondary | ICD-10-CM | POA: Diagnosis present

## 2020-02-19 DIAGNOSIS — Z7901 Long term (current) use of anticoagulants: Secondary | ICD-10-CM

## 2020-02-19 DIAGNOSIS — E870 Hyperosmolality and hypernatremia: Secondary | ICD-10-CM | POA: Diagnosis not present

## 2020-02-19 DIAGNOSIS — E1151 Type 2 diabetes mellitus with diabetic peripheral angiopathy without gangrene: Secondary | ICD-10-CM | POA: Diagnosis present

## 2020-02-19 DIAGNOSIS — J439 Emphysema, unspecified: Secondary | ICD-10-CM | POA: Diagnosis present

## 2020-02-19 DIAGNOSIS — J449 Chronic obstructive pulmonary disease, unspecified: Secondary | ICD-10-CM | POA: Diagnosis present

## 2020-02-19 DIAGNOSIS — I420 Dilated cardiomyopathy: Secondary | ICD-10-CM | POA: Diagnosis present

## 2020-02-19 DIAGNOSIS — I428 Other cardiomyopathies: Secondary | ICD-10-CM | POA: Diagnosis present

## 2020-02-19 DIAGNOSIS — E785 Hyperlipidemia, unspecified: Secondary | ICD-10-CM | POA: Diagnosis present

## 2020-02-19 DIAGNOSIS — E1159 Type 2 diabetes mellitus with other circulatory complications: Secondary | ICD-10-CM | POA: Diagnosis present

## 2020-02-19 DIAGNOSIS — Z20822 Contact with and (suspected) exposure to covid-19: Secondary | ICD-10-CM | POA: Diagnosis present

## 2020-02-19 DIAGNOSIS — I10 Essential (primary) hypertension: Secondary | ICD-10-CM | POA: Diagnosis not present

## 2020-02-19 DIAGNOSIS — J9611 Chronic respiratory failure with hypoxia: Secondary | ICD-10-CM | POA: Diagnosis present

## 2020-02-19 DIAGNOSIS — E43 Unspecified severe protein-calorie malnutrition: Secondary | ICD-10-CM | POA: Diagnosis present

## 2020-02-19 DIAGNOSIS — Z833 Family history of diabetes mellitus: Secondary | ICD-10-CM

## 2020-02-19 LAB — CBC
HCT: 44.2 % (ref 39.0–52.0)
Hemoglobin: 12.9 g/dL — ABNORMAL LOW (ref 13.0–17.0)
MCH: 28.5 pg (ref 26.0–34.0)
MCHC: 29.2 g/dL — ABNORMAL LOW (ref 30.0–36.0)
MCV: 97.8 fL (ref 80.0–100.0)
Platelets: 192 10*3/uL (ref 150–400)
RBC: 4.52 MIL/uL (ref 4.22–5.81)
RDW: 16.7 % — ABNORMAL HIGH (ref 11.5–15.5)
WBC: 11.3 10*3/uL — ABNORMAL HIGH (ref 4.0–10.5)
nRBC: 0 % (ref 0.0–0.2)

## 2020-02-19 LAB — URINALYSIS, ROUTINE W REFLEX MICROSCOPIC
Bacteria, UA: NONE SEEN
Bilirubin Urine: NEGATIVE
Glucose, UA: >=500 mg/dL — AB
Hgb urine dipstick: NEGATIVE
Ketones, ur: NEGATIVE mg/dL
Leukocytes,Ua: NEGATIVE
Nitrite: NEGATIVE
Protein, ur: NEGATIVE mg/dL
Specific Gravity, Urine: 1.022 (ref 1.005–1.030)
pH: 5 (ref 5.0–8.0)

## 2020-02-19 LAB — COMPREHENSIVE METABOLIC PANEL
ALT: 25 U/L (ref 0–44)
AST: 16 U/L (ref 15–41)
Albumin: 4.2 g/dL (ref 3.5–5.0)
Alkaline Phosphatase: 149 U/L — ABNORMAL HIGH (ref 38–126)
Anion gap: 17 — ABNORMAL HIGH (ref 5–15)
BUN: 63 mg/dL — ABNORMAL HIGH (ref 8–23)
CO2: 27 mmol/L (ref 22–32)
Calcium: 10.6 mg/dL — ABNORMAL HIGH (ref 8.9–10.3)
Chloride: 83 mmol/L — ABNORMAL LOW (ref 98–111)
Creatinine, Ser: 2.68 mg/dL — ABNORMAL HIGH (ref 0.61–1.24)
GFR calc Af Amer: 27 mL/min — ABNORMAL LOW (ref >60)
GFR calc non Af Amer: 24 mL/min — ABNORMAL LOW (ref >60)
Glucose, Bld: 1357 mg/dL (ref 70–99)
Potassium: 5.4 mmol/L — ABNORMAL HIGH (ref 3.5–5.1)
Sodium: 127 mmol/L — ABNORMAL LOW (ref 135–145)
Total Bilirubin: 0.9 mg/dL (ref 0.3–1.2)
Total Protein: 8.3 g/dL — ABNORMAL HIGH (ref 6.5–8.1)

## 2020-02-19 LAB — CBG MONITORING, ED
Glucose-Capillary: >600 mg/dL (ref 70–99)
Glucose-Capillary: >600 mg/dL (ref 70–99)

## 2020-02-19 LAB — PROTIME-INR
INR: 1.6 — ABNORMAL HIGH (ref 0.8–1.2)
Prothrombin Time: 18.7 seconds — ABNORMAL HIGH (ref 11.4–15.2)

## 2020-02-19 LAB — RESPIRATORY PANEL BY RT PCR (FLU A&B, COVID)
Influenza A by PCR: NEGATIVE
Influenza B by PCR: NEGATIVE
SARS Coronavirus 2 by RT PCR: NEGATIVE

## 2020-02-19 LAB — LIPASE, BLOOD: Lipase: 48 U/L (ref 11–51)

## 2020-02-19 MED ORDER — LEVETIRACETAM 500 MG PO TABS
500.0000 mg | ORAL_TABLET | Freq: Two times a day (BID) | ORAL | Status: DC
  Administered 2020-02-19 – 2020-02-23 (×8): 500 mg via ORAL
  Filled 2020-02-19 (×9): qty 1

## 2020-02-19 MED ORDER — DEXTROSE 50 % IV SOLN: 0.0000 mL | INTRAVENOUS | Status: DC | PRN

## 2020-02-19 MED ORDER — CARVEDILOL 6.25 MG PO TABS
6.2500 mg | ORAL_TABLET | Freq: Two times a day (BID) | ORAL | Status: DC
  Administered 2020-02-20 – 2020-02-23 (×7): 6.25 mg via ORAL
  Filled 2020-02-19 (×7): qty 1

## 2020-02-19 MED ORDER — SODIUM CHLORIDE 0.9% FLUSH
3.0000 mL | Freq: Once | INTRAVENOUS | Status: AC
  Administered 2020-02-21: 3 mL via INTRAVENOUS

## 2020-02-19 MED ORDER — INSULIN REGULAR(HUMAN) IN NACL 100-0.9 UT/100ML-% IV SOLN
INTRAVENOUS | Status: DC
  Administered 2020-02-19: 9 [IU]/h via INTRAVENOUS
  Administered 2020-02-20: 1.3 [IU]/h via INTRAVENOUS
  Filled 2020-02-19 (×2): qty 100

## 2020-02-19 MED ORDER — SODIUM CHLORIDE 0.9 % IV BOLUS
1000.0000 mL | Freq: Once | INTRAVENOUS | Status: AC
  Administered 2020-02-19: 1000 mL via INTRAVENOUS

## 2020-02-19 MED ORDER — SODIUM CHLORIDE 0.9 % IV BOLUS: 1000.0000 mL | INTRAVENOUS | Status: AC

## 2020-02-19 MED ORDER — ADULT MULTIVITAMIN W/MINERALS CH
1.0000 | ORAL_TABLET | Freq: Every day | ORAL | Status: DC
  Administered 2020-02-20 – 2020-02-23 (×5): 1 via ORAL
  Filled 2020-02-19 (×5): qty 1

## 2020-02-19 MED ORDER — SODIUM CHLORIDE 0.9 % IV SOLN: INTRAVENOUS | Status: DC

## 2020-02-19 MED ORDER — UMECLIDINIUM-VILANTEROL 62.5-25 MCG/INH IN AEPB
1.0000 | INHALATION_SPRAY | Freq: Every day | RESPIRATORY_TRACT | Status: DC
  Administered 2020-02-20 – 2020-02-23 (×3): 1 via RESPIRATORY_TRACT
  Filled 2020-02-19: qty 14

## 2020-02-19 MED ORDER — AMLODIPINE BESYLATE 5 MG PO TABS
5.0000 mg | ORAL_TABLET | Freq: Every day | ORAL | Status: DC
  Administered 2020-02-20 – 2020-02-23 (×4): 5 mg via ORAL
  Filled 2020-02-19 (×4): qty 1

## 2020-02-19 MED ORDER — ROSUVASTATIN CALCIUM 5 MG PO TABS
10.0000 mg | ORAL_TABLET | Freq: Every day | ORAL | Status: DC
  Administered 2020-02-19 – 2020-02-23 (×5): 10 mg via ORAL
  Filled 2020-02-19 (×5): qty 2

## 2020-02-19 MED ORDER — DEXTROSE-NACL 5-0.45 % IV SOLN: INTRAVENOUS | Status: DC

## 2020-02-19 MED ORDER — HYDROCODONE-ACETAMINOPHEN 5-325 MG PO TABS: 1.0000 | ORAL_TABLET | Freq: Every day | ORAL | Status: DC | PRN

## 2020-02-19 NOTE — ED Notes (Signed)
CBG Results of HI >600 reported to [place], Therapist, sports.

## 2020-02-19 NOTE — ED Triage Notes (Signed)
Glucose 1,357

## 2020-02-19 NOTE — H&P (Signed)
History and Physical    Kyle [name]:[id number] DOB: 22-Nov-1950 DOA: 02/19/2020  PCP: Alonna Buckler, MD  Patient coming from: Home  I have personally briefly reviewed patient's old medical records in [place]  Chief Complaint: Emesis  HPI: Kyle [name] is a 69 y.o. male with medical history significant of DM2, on insulin, AI, COPD on 5L, HTN, PAD.  Pt with extensive recent hospital history: Admitted end of feb-mid march with AKF, hyperkalemia, severe lactic acidosis unclear etiology.  Required IP dialysis during that admit, ultimately able to be weaned off of dialysis and discharged.  Readmitted at end of march for CHF exacerbation due to lasix having been discontinued during prior hospital stay (due to severe AKF).  Diuresed with IV lasix, transitioned to PO.  Looks like new baseline creat was about 2.5  As outpt, had insulin decreased recently per patient.  Developed N/V this morning, has had some diarrhea since last week as well.  Generalized weakness.  No fever, no CP, SOB, headache, cough, etc.   ED Course: AG 17, no keytones in urine.  But BGL is 1357!  Creat 2.6 with BUN 60.  Sodium (uncorrected) 127.  2L NS bolus in ED ordered, and hospitalist asked to admit.  Review of Systems: As per HPI, otherwise all review of systems negative.  Past Medical History:  Diagnosis Date  . Aortic insufficiency    MODERATE WITH A BICUSPID AORTIC VAVLE  . Arterial occlusive disease    MULTILEVEL  . CHF (congestive heart failure) ([place])   . COPD (chronic obstructive pulmonary disease) ([place])   . Dilated cardiomyopathy (HCC)    WITH EJECTION FRACTION DOWN TO 20-25%--WITH CONGESTIVE HEART FAILURE  . Edema    LOWER EXTREMETIES  . Hypertension   . Normal coronary arteries 2009  . Orthopnea   . Peripheral arterial disease ([place])   . Pulmonary hypertension ([place])   . SOB (shortness of breath)     Past Surgical History:  Procedure Laterality Date  . ABDOMINAL  AORTAGRAM N/A 11/05/2014   Procedure: ABDOMINAL Maxcine Ham;  Surgeon: Angelia Mould, MD;  Location[facility] C[facility] LAB;  Service: Cardiovascular;  Laterality: N/A;  . ABDOMINAL AORTOGRAM W/LOWER EXTREMITY Bilateral 04/21/2019   Procedure: ABDOMINAL AORTOGRAM W/LOWER EXTREMITY;  Surgeon: Angelia Mould, MD;  Location: [place] CV LAB;  Service: Cardiovascular;  Laterality: Bilateral;  . AORTA - BILATERAL FEMORAL ARTERY BYPASS GRAFT N/A 12/18/2014   Procedure: AORTOBIFEMORAL BYPASS GRAFT;  Surgeon: Angelia Mould, MD;  Location: [place];  Service: Vascular;  Laterality: N/A;  . CARDIAC CATHETERIZATION  2009   Nl Cors, EF 20%  . COLONOSCOPY    . ENDARTERECTOMY FEMORAL Left 12/18/2014   Procedure: ENDARTERECTOMY FEMORAL;  Surgeon: Angelia Mould, MD;  Location: [place];  Service: Vascular;  Laterality: Left;  . FALSE ANEURYSM REPAIR Right 04/24/2019   Procedure: REPAIR OF RIGHT FEMORAL ARTERY PSEUDOANEURYSM;  Surgeon: Rosetta Posner, MD;  Location: [place];  Service: Vascular;  Laterality: Right;  . FEMORAL-POPLITEAL BYPASS GRAFT  02/21/2011   right  . FEMORAL-TIBIAL BYPASS GRAFT Right 04/24/2019   Procedure: REDO BYPASS GRAFT RIGHT FEMORAL-TIBIAL ARTERY USING LEFT LEG VEIN & HEMASHIELD GOLD 59mm GRAFT;  Surgeon: Rosetta Posner, MD;  Location: [place];  Service: Vascular;  Laterality: Right;  . IR FLUORO GUIDE CV LINE RIGHT  12/28/2019  . IR REMOVAL TUN CV CATH W/O FL  01/11/2020  . IR US GUIDE VASC ACCESS RIGHT  12/28/2019  .  OTHER SURGICAL HISTORY     POST RIGHT FEMOROPOPLITEAL BYPASS GRAFT  . PERIPHERAL VASCULAR CATHETERIZATION  11/05/2014   Procedure: LOWER EXTREMITY ANGIOGRAPHY;  Surgeon: Angelia Mould, MD;  Location: [facility] CATH LAB;  Service: Cardiovascular;;  . VEIN HARVEST Left 04/24/2019   Procedure: LEFT LEG SAPHENOUS VEIN HARVEST;  Surgeon: Rosetta Posner, MD;  Location: [place];  Service: Vascular;  Laterality: Left;     reports that he quit smoking about 16 months ago. He has a  40.00 pack-year smoking history. He has never used smokeless tobacco. He reports that he does not drink alcohol or use drugs.  No Known Allergies  Family History  Problem Relation Age of Onset  . Diabetes Mother   . Hyperlipidemia Sister   . Hypertension Sister      Prior to Admission medications   Medication Sig Start Date End Date Taking? Authorizing Provider  amLODipine (NORVASC) 5 MG tablet TAKE 1 TABLET EVERY DAY Patient taking differently: Take 5 mg by mouth daily.  02/07/20  Yes Martin[place], Peter M, MD  ANORO ELLIPTA 62.5-25 MCG/INH AEPB INHALE 1 PUFF BY MOUTH ONCE DAILY . APPOINTMENT REQUIRED FOR FUTURE REFILLS Patient taking differently: Inhale 1 puff into the lungs daily.  07/31/19  Yes Rigoberto Noel, MD  carvedilol (COREG) 6.25 MG tablet Take 1 tablet (6.25 mg total) by mouth 2 (two) times daily with a meal. 01/11/20 03/11/20 Yes Kc, Maren [place], MD  furosemide (LASIX) 20 MG tablet Take 1 tablet (20 mg total) by mouth 2 (two) times daily. 01/20/20  Yes Pattricia Boss, MD  furosemide (LASIX) 80 MG tablet Take 1 tablet (80 mg total) by mouth 2 (two) times daily. 01/24/20  Yes Hoyt Koch, MD  guaiFENesin (MUCINEX) 600 MG 12 hr tablet Take 2 tablets (1,200 mg total) by mouth 2 (two) times daily. Patient taking differently: Take 600 mg by mouth 2 (two) times daily.  11/30/18  Yes Lavina Hamman, MD  HYDROcodone-acetaminophen (NORCO/VICODIN) 5-325 MG tablet Take 1 tablet by mouth daily as needed for severe pain.    Yes [provider]  insulin glargine (LANTUS) 100 UNIT/ML injection Inject 0.06 mLs (6 Units total) into the skin at bedtime. Patient taking differently: Inject 8 Units into the skin at bedtime.  01/11/20  Yes Antonieta Pert, MD  levETIRAcetam (KEPPRA) 500 MG tablet Take 1 tablet (500 mg total) by mouth 2 (two) times daily. 01/11/20 02/19/20 Yes Antonieta Pert, MD  Multiple Vitamin (MULTIVITAMIN WITH MINERALS) TABS tablet Take 1 tablet by mouth daily.   Yes [provider]  OXYGEN Inhale 4.5 L into the lungs continuous.   Yes [provider]  rosuvastatin (CRESTOR) 10 MG tablet Take 1 tablet (10 mg total) by mouth daily at 6 PM. Patient taking differently: Take 10 mg by mouth daily.  01/24/20  Yes Hoyt Koch, MD  warfarin (COUMADIN) 2 MG tablet Take 1 tablet (2 mg total) by mouth daily at 6 PM. Patient taking differently: Take 2 mg by mouth daily.  01/11/20 02/19/20 Yes Antonieta Pert, MD  ACCU-CHEK FASTCLIX LANCETS MISC  07/30/18   [provider]  Accu-Chek FastClix Lancets MISC USE TO MONITOR BLOOD GLUCOSE 2 TIMES DAILY 11/06/19   [provider]  ACCU-CHEK GUIDE test strip  11/30/18   [provider]  BD PEN NEEDLE NANO U/F 32G X 4 MM MISC  08/06/18   [provider]  glucose blood (ACCU-CHEK GUIDE) test strip one strip (1 each dose) by Other route  2 (two) times daily. Test blood glucose 2 times a day. 11/21/19   [provider]  Insulin Pen Needle (BD PEN NEEDLE NANO U/F) 32G X 4 MM MISC USE TO INJECT INTO THE SKIN DAILY. 11/06/19   [provider]    Physical Exam: Vitals:   02/19/20 1720 02/19/20 1731 02/19/20 1922 02/19/20 1924  BP: 132/73  (!) 168/79 (!) 168/78  Pulse: 94  81 81  Resp: 20  20 20   Temp: 98 F (36.7 C)   97.8 F (36.6 C)  TempSrc: Oral   Oral  SpO2: (!) 80% 95% 94% 99%  Weight: 72.1 kg     Height: 5\' 11"  (1.803 m)       Constitutional: NAD, calm, comfortable Eyes: PERRL, lids and conjunctivae normal ENMT: Mucous membranes are moist. Posterior pharynx clear of any exudate or lesions.Normal dentition.  Neck: normal, supple, no masses, no thyromegaly Respiratory: clear to auscultation bilaterally, no wheezing, no crackles. Normal respiratory effort. No accessory muscle use.  Cardiovascular: Regular rate and rhythm, no murmurs / rubs / gallops. No extremity edema. 2+ pedal pulses. No carotid bruits.  Abdomen: no tenderness, no masses palpated. No hepatosplenomegaly.  Bowel sounds positive.  Musculoskeletal: no clubbing / cyanosis. No joint deformity upper and lower extremities. Good ROM, no contractures. Normal muscle tone.  Skin: no rashes, lesions, ulcers. No induration Neurologic: CN 2-12 grossly intact. Sensation intact, DTR normal. Strength 5/5 in all 4.  Psychiatric: Normal judgment and insight. Alert and oriented x 3. Normal mood.    Labs on Admission: I have personally reviewed following labs and imaging studies  CBC: Recent Labs  Lab 02/19/20 1728  WBC 11.3*  HGB 12.9*  HCT 44.2  MCV 97.8  PLT 597   Basic Metabolic Panel: Recent Labs  Lab 02/19/20 1728  NA 127*  K 5.4*  CL 83*  CO2 27  GLUCOSE 1,357*  BUN 63*  CREATININE 2.68*  CALCIUM 10.6*   GFR: Estimated Creatinine Clearance: 27.3 mL/min (A) (by C-G formula based on SCr of 2.68 mg/dL (H)). Liver Function Tests: Recent Labs  Lab 02/19/20 1728  AST 16  ALT 25  ALKPHOS 149*  BILITOT 0.9  PROT 8.3*  ALBUMIN 4.2   Recent Labs  Lab 02/19/20 1728  LIPASE 48   No results for input(s): AMMONIA in the last 168 hours. Coagulation Profile: No results for input(s): INR, PROTIME in the last 168 hours. Cardiac Enzymes: No results for input(s): CKTOTAL, CKMB, CKMBINDEX, TROPONINI in the last 168 hours. BNP (last 3 results) No results for input(s): PROBNP in the last 8760 hours. HbA1C: No results for input(s): HGBA1C in the last 72 hours. CBG: No results for input(s): GLUCAP in the last 168 hours. Lipid Profile: No results for input(s): CHOL, HDL, LDLCALC, TRIG, CHOLHDL, LDLDIRECT in the last 72 hours. Thyroid Function Tests: No results for input(s): TSH, T4TOTAL, FREET4, T3FREE, THYROIDAB in the last 72 hours. Anemia Panel: No results for input(s): VITAMINB12, FOLATE, FERRITIN, TIBC, IRON, RETICCTPCT in the last 72 hours. Urine analysis:    Component Value Date/Time   COLORURINE YELLOW 02/19/2020 1938   APPEARANCEUR CLEAR 02/19/2020 1938   LABSPEC 1.022  02/19/2020 1938   PHURINE 5.0 02/19/2020 1938   GLUCOSEU >=500 (A) 02/19/2020 1938   HGBUR NEGATIVE 02/19/2020 1938   BILIRUBINUR NEGATIVE 02/19/2020 1938   KETONESUR NEGATIVE 02/19/2020 1938   PROTEINUR NEGATIVE 02/19/2020 1938   UROBILINOGEN 0.2 12/19/2014 0821   NITRITE NEGATIVE 02/19/2020 1938   LEUKOCYTESUR NEGATIVE 02/19/2020 1938  Radiological Exams on Admission: No results found.  EKG: Independently reviewed.  Assessment/Plan Principal Problem:   Hyperosmolar hyperglycemic state (HHS) ([place]) Active Problems:   Chronic systolic CHF (congestive heart failure) (HCC)   Chronic respiratory failure with hypoxia (HCC)   Atrial fibrillation, chronic (HCC)   Diabetes mellitus type 2, insulin dependent (HCC)   CKD (chronic kidney disease) stage 4, GFR 15-29 ml/min (HCC)    1. HHS - 1. Diabetic crisis pathway 2. Insulin gtt 3. IVF: 2L NS bolus + NS at 100 cc/hr then D5 half at 75 cc/hr 4. BMP Q4H 5. BHB pending but no keytones in urine and AG 17 (probably lactate from dehydration I suspect). 6. Osm pending but this should be high 2. Chronic systolic CHF - 1. Hold lasix 2. Watch for fluid overload with IVF as above 3. COPD - 1. on 5L chronic home O2 2. Cont home nebs 4. CKD stage 4 - 1. Creat 2.6 is suspected to be near his current baseline based on DC end March 2. Monitor with serial BMPs 3. Gentle hydration and holding lasix as above 5. HTN - cont home meds 6. A.Fib - 1. Cont coreg 2. Cont coumadin 3. Tele monitor  DVT prophylaxis: Coumadin Code Status: Full Family Communication: No family in room Disposition Plan: Home after admit Consults called: None Admission status: Admit to inpatient  Severity of Illness: The appropriate patient status for this patient is INPATIENT. Inpatient status is judged to be reasonable and necessary in order to provide the required intensity of service to ensure the patient's safety. The patient's presenting symptoms, physical  exam findings, and initial radiographic and laboratory data in the context of their chronic comorbidities is felt to place them at high risk for further clinical deterioration. Furthermore, it is not anticipated that the patient will be medically stable for discharge from the hospital within 2 midnights of admission. The following factors support the patient status of inpatient.   IP status for management of HHS, insulin gtt, BGL of 1357!   * I certify that at the point of admission it is my clinical judgment that the patient will require inpatient hospital care spanning beyond 2 midnights from the point of admission due to high intensity of service, high risk for further deterioration and high frequency of surveillance required.*    Demontez Novack M. DO [facility]  How to contact the [facility] Attending or Consulting provider [place] or covering provider during after hours [place], for this patient?  1. Check the care team in [facility] and look for a) attending/consulting [facility] provider listed and b) the [facility] team listed 2. Log into [email or web address]  Amion Physician Scheduling and messaging for groups and whole hospitals  On call and physician scheduling software for group practices, residents, hospitalists and other medical providers for call, clinic, rotation and shift schedules. On[employer] is a hospital-wide system for scheduling doctors and paging doctors on call. EasyPlot is for scientific plotting and data analysis.  [email or web address]  and use [place]'s universal password to access. If you do not have the password, please contact the hospital operator.  3. Locate the [facility] provider you are looking for under [facility] and page to a number that you can be directly reached. 4. If you still have difficulty reaching the provider, please page the [facility] (Director on Call) for the Hospitalists listed on amion for assistance.  02/19/2020, 9:37 PM

## 2020-02-19 NOTE — ED Provider Notes (Signed)
[facility] EMERGENCY DEPARTMENT Provider Note   CSN: [id number] Arrival date & time: 02/19/20  1625     History Chief Complaint  Patient presents with  . Emesis    Kyle [name] is a 69 y.o. male.  HPI Patient presents to the emergency department with vomiting that started this a.m.  The patient states that he has had some diarrhea since last week as well.  The patient states that he generally feels weak.  Patient normally wears home oxygen as well.  The patient states he recently had an insulin dose that was decreased.  Patient states that he had no other symptoms such as fevers.  The patient denies chest pain, shortness of breath, headache,blurred vision, neck pain, fever, cough, weakness, numbness, dizziness, anorexia, edema, abdominal pain, rash, back pain, dysuria, hematemesis, bloody stool, near syncope, or syncope.    Past Medical History:  Diagnosis Date  . Aortic insufficiency    MODERATE WITH A BICUSPID AORTIC VAVLE  . Arterial occlusive disease    MULTILEVEL  . CHF (congestive heart failure) ([place])   . COPD (chronic obstructive pulmonary disease) ([place])   . Dilated cardiomyopathy (HCC)    WITH EJECTION FRACTION DOWN TO 20-25%--WITH CONGESTIVE HEART FAILURE  . Edema    LOWER EXTREMETIES  . Hypertension   . Normal coronary arteries 2009  . Orthopnea   . Peripheral arterial disease ([place])   . Pulmonary hypertension ([place])   . SOB (shortness of breath)     Patient Active Problem List   Diagnosis Date Noted  . Abnormal findings on diagnostic imaging of lung 01/29/2020  . Anticoagulant long-term use 01/29/2020  . Hypoxia   . Heart failure ([place]) 01/20/2020  . SOB (shortness of breath)   . Acute metabolic encephalopathy   . Lactic acidosis 12/24/2019  . Uremia 12/24/2019  . High anion gap metabolic acidosis 09/47/0962  . Atrial fibrillation, chronic ([place]) 12/24/2019  . Diabetes mellitus type 2, insulin dependent ([place]) 12/24/2019  . Hyperkalemia,  diminished renal excretion 12/24/2019  . Healthcare maintenance 07/14/2019  . Medication management 07/14/2019  . PAD (peripheral artery disease) ([name]) 04/22/2019  . Atherosclerosis of artery of extremity with rest pain ([place]) 04/21/2019  . COPD with chronic bronchitis and emphysema ([place]) 12/16/2018  . Chronic respiratory failure with hypoxia ([place]) 12/16/2018  . Acute renal failure ([place])   . Protein-calorie malnutrition, severe ([place]) 12/18/2014  . Aortoiliac occlusive disease ([place]) 12/16/2014  . PVD (peripheral vascular disease) ([place]) 10/24/2014  . Aortic stenosis 09/12/2014  . 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 Pulmonary hypertension ([place]) 09/12/2014  . Dyslipidemia 09/12/2014  . Atherosclerosis of native arteries of extremity with intermittent claudication ([place]) 05/11/2012  . Aortic insufficiency   . Hypertension   . Peripheral arterial disease ([place])   . Dilated cardiomyopathy ([place])   . Chronic systolic CHF (congestive heart failure) (HCC)     Past Surgical History:  Procedure Laterality Date  . ABDOMINAL AORTAGRAM N/A 11/05/2014   Procedure: ABDOMINAL Maxcine Ham;  Surgeon: Angelia Mould, MD;  Location: [facility] CATH LAB;  Service: Cardiovascular;  Laterality: N/A;  . ABDOMINAL AORTOGRAM W/LOWER EXTREMITY Bilateral 04/21/2019   Procedure: ABDOMINAL AORTOGRAM W/LOWER EXTREMITY;  Surgeon: Angelia Mould, MD;  Location: [facility] CV LAB;  Service: Cardiovascular;  Laterality: Bilateral;  . AORTA - BILATERAL FEMORAL ARTERY BYPASS GRAFT N/A 12/18/2014   Procedure: AORTOBIFEMORAL BYPASS GRAFT;  Surgeon: Angelia Mould, MD;  Location: [facility];  Service: Vascular;  Laterality: N/A;  . CARDIAC CATHETERIZATION  2009   Nl Cors, EF 20%  . COLONOSCOPY    . ENDARTERECTOMY FEMORAL Left 12/18/2014   Procedure: ENDARTERECTOMY FEMORAL;  Surgeon: Angelia Mould, MD;  Location: [facility];  Service: Vascular;  Laterality: Left;  . FALSE ANEURYSM REPAIR Right 04/24/2019    Procedure: REPAIR OF RIGHT FEMORAL ARTERY PSEUDOANEURYSM;  Surgeon: Rosetta Posner, MD;  Location: [facility];  Service: Vascular;  Laterality: Right;  . FEMORAL-POPLITEAL BYPASS GRAFT  02/21/2011   right  . FEMORAL-TIBIAL BYPASS GRAFT Right 04/24/2019   Procedure: REDO BYPASS GRAFT RIGHT FEMORAL-TIBIAL ARTERY USING LEFT LEG VEIN & HEMASHIELD GOLD 24mm GRAFT;  Surgeon: Rosetta Posner, MD;  Location: [facility];  Service: Vascular;  Laterality: Right;  . IR FLUORO GUIDE CV LINE RIGHT  12/28/2019  . IR REMOVAL TUN CV CATH W/O FL  01/11/2020  . IR US GUIDE VASC ACCESS RIGHT  12/28/2019  . OTHER SURGICAL HISTORY     POST RIGHT FEMOROPOPLITEAL BYPASS GRAFT  . PERIPHERAL VASCULAR CATHETERIZATION  11/05/2014   Procedure: LOWER EXTREMITY ANGIOGRAPHY;  Surgeon: Angelia Mould, MD;  Location: [facility] CATH LAB;  Service: Cardiovascular;;  . VEIN HARVEST Left 04/24/2019   Procedure: LEFT LEG SAPHENOUS VEIN HARVEST;  Surgeon: Rosetta Posner, MD;  Location: [facility] OR;  Service: Vascular;  Laterality: Left;       Family History  Problem Relation Age of Onset  . Diabetes Mother   . Hyperlipidemia Sister   . Hypertension Sister     Social History   Tobacco Use  . Smoking status: Former Smoker    Packs/day: 1.00    Years: 40.00    Pack years: 40.00    Quit date: 10/15/2018    Years since quitting: 1.3  . Smokeless tobacco: Never Used  . Tobacco comment: quit 2010 and quit again 2019  Substance Use Topics  . Alcohol use: No    Alcohol/week: 0.0 standard drinks  . Drug use: No    Home Medications Prior to Admission medications   Medication Sig Start Date End Date Taking? Authorizing Provider  amLODipine (NORVASC) 5 MG tablet TAKE 1 TABLET EVERY DAY Patient taking differently: Take 5 mg by mouth daily.  02/07/20  Yes Martinique, Peter M, MD  ANORO ELLIPTA 62.5-25 MCG/INH AEPB INHALE 1 PUFF BY MOUTH ONCE DAILY . APPOINTMENT REQUIRED FOR FUTURE REFILLS Patient taking differently: Inhale 1 puff into the lungs daily.   07/31/19  Yes Rigoberto Noel, MD  carvedilol (COREG) 6.25 MG tablet Take 1 tablet (6.25 mg total) by mouth 2 (two) times daily with a meal. 01/11/20 03/11/20 Yes Kc, Maren [place], MD  furosemide (LASIX) 20 MG tablet Take 1 tablet (20 mg total) by mouth 2 (two) times daily. 01/20/20  Yes Pattricia Boss, MD  furosemide (LASIX) 80 MG tablet Take 1 tablet (80 mg total) by mouth 2 (two) times daily. 01/24/20  Yes Hoyt Koch, MD  guaiFENesin (MUCINEX) 600 MG 12 hr tablet Take 2 tablets (1,200 mg total) by mouth 2 (two) times daily. Patient taking differently: Take 600 mg by mouth 2 (two) times daily.  11/30/18  Yes Lavina Hamman, MD  HYDROcodone-acetaminophen (NORCO/VICODIN) 5-325 MG tablet Take 1 tablet by mouth daily as needed for severe pain.    Yes [provider]  insulin glargine (LANTUS) 100 UNIT/ML injection Inject 0.06 mLs (6 Units total) into the skin at bedtime. Patient taking differently: Inject 8 Units into the skin at bedtime.  01/11/20  Yes Antonieta Pert, MD  levETIRAcetam (KEPPRA)  500 MG tablet Take 1 tablet (500 mg total) by mouth 2 (two) times daily. 01/11/20 02/19/20 Yes Antonieta Pert, MD  Multiple Vitamin (MULTIVITAMIN WITH MINERALS) TABS tablet Take 1 tablet by mouth daily.   Yes [provider]  OXYGEN Inhale 4.5 L into the lungs continuous.   Yes [provider]  rosuvastatin (CRESTOR) 10 MG tablet Take 1 tablet (10 mg total) by mouth daily at 6 PM. Patient taking differently: Take 10 mg by mouth daily.  01/24/20  Yes Hoyt Koch, MD  warfarin (COUMADIN) 2 MG tablet Take 1 tablet (2 mg total) by mouth daily at 6 PM. Patient taking differently: Take 2 mg by mouth daily.  01/11/20 02/19/20 Yes Antonieta Pert, MD  ACCU-CHEK FASTCLIX LANCETS MISC  07/30/18   [provider]  Accu-Chek FastClix Lancets MISC USE TO MONITOR BLOOD GLUCOSE 2 TIMES DAILY 11/06/19   [provider]  ACCU-CHEK GUIDE test strip  11/30/18   [provider]  BD PEN  NEEDLE NANO U/F 32G X 4 MM MISC  08/06/18   [provider]  glucose blood (ACCU-CHEK GUIDE) test strip one strip (1 each dose) by Other route 2 (two) times daily. Test blood glucose 2 times a day. 11/21/19   [provider]  Insulin Pen Needle (BD PEN NEEDLE NANO U/F) 32G X 4 MM MISC USE TO INJECT INTO THE SKIN DAILY. 11/06/19   [provider]    Allergies    Patient has no known allergies.  Review of Systems   Review of Systems All other systems negative except as documented in the HPI. All pertinent positives and negatives as reviewed in the HPI. Physical Exam Updated Vital Signs BP (!) 168/78 (BP Location: Right Arm)   Pulse 81   Temp 97.8 F (36.6 C) (Oral)   Resp 20   Ht 5\' 11"  (1.803 m)   Wt 72.1 kg   SpO2 99%   BMI 22.18 kg/m   Physical Exam Vitals and nursing note reviewed.  Constitutional:      General: He is not in acute distress.    Appearance: He is well-developed.  HENT:     Head: Normocephalic and atraumatic.  Eyes:     Pupils: Pupils are equal, round, and reactive to light.  Cardiovascular:     Rate and Rhythm: Normal rate and regular rhythm.     Heart sounds: Normal heart sounds. No murmur. No friction rub. No gallop.   Pulmonary:     Effort: Pulmonary effort is normal. No respiratory distress.     Breath sounds: Normal breath sounds. No wheezing.  Abdominal:     General: Bowel sounds are normal. There is no distension.     Palpations: Abdomen is soft.     Tenderness: There is no abdominal tenderness.  Musculoskeletal:     Cervical back: Normal range of motion and neck supple.  Skin:    General: Skin is warm and dry.     Capillary Refill: Capillary refill takes less than 2 seconds.     Findings: No erythema or rash.  Neurological:     Mental Status: He is alert and oriented to person, place, and time.     Motor: No abnormal muscle tone.     Coordination: Coordination normal.  Psychiatric:        Behavior: Behavior  normal.     ED Results / Procedures / Treatments   Labs (all labs ordered are listed, but only abnormal results are displayed) Labs  Reviewed  COMPREHENSIVE METABOLIC PANEL - Abnormal; Notable for the following components:      Result Value   Sodium 127 (*)    Potassium 5.4 (*)    Chloride 83 (*)    Glucose, Bld 1,357 (*)    BUN 63 (*)    Creatinine, Ser 2.68 (*)    Calcium 10.6 (*)    Total Protein 8.3 (*)    Alkaline Phosphatase 149 (*)    GFR calc non Af Amer 24 (*)    GFR calc Af Amer 27 (*)    Anion gap 17 (*)    All other components within normal limits  CBC - Abnormal; Notable for the following components:   WBC 11.3 (*)    Hemoglobin 12.9 (*)    MCHC 29.2 (*)    RDW 16.7 (*)    All other components within normal limits  URINALYSIS, ROUTINE W REFLEX MICROSCOPIC - Abnormal; Notable for the following components:   Glucose, UA >=500 (*)    All other components within normal limits  RESPIRATORY PANEL BY RT PCR (FLU A&B, COVID)  LIPASE, BLOOD  BETA-HYDROXYBUTYRIC ACID  BETA-HYDROXYBUTYRIC ACID  CBG MONITORING, ED  I-STAT VENOUS BLOOD GAS, ED  I-STAT VENOUS BLOOD GAS, ED    EKG None  Radiology No results found.  Procedures Procedures (including critical care time)  Medications Ordered in ED Medications  sodium chloride flush (NS) 0.9 % injection 3 mL (has no administration in time range)  sodium chloride 0.9 % bolus 1,000 mL (has no administration in time range)  amLODipine (NORVASC) tablet 5 mg (has no administration in time range)  umeclidinium-vilanterol (ANORO ELLIPTA) 62.5-25 MCG/INH 1 puff (has no administration in time range)  carvedilol (COREG) tablet 6.25 mg (has no administration in time range)  levETIRAcetam (KEPPRA) tablet 500 mg (has no administration in time range)  rosuvastatin (CRESTOR) tablet 10 mg (has no administration in time range)  multivitamin with minerals tablet 1 tablet (has no administration in time range)   HYDROcodone-acetaminophen (NORCO/VICODIN) 5-325 MG per tablet 1 tablet (has no administration in time range)  sodium chloride 0.9 % bolus 1,000 mL (1,000 mLs Intravenous Bolus from Bag 02/19/20 2044)    ED Course  I have reviewed the triage vital signs and the nursing notes.  Pertinent labs & imaging results that were available during my care of the patient were reviewed by me and considered in my medical decision making (see chart for details).    MDM Rules/Calculators/A&P                       CRITICAL CARE Performed by: Resa Miner Aliviya Schoeller Total critical care time:30 minutes Critical care time was exclusive of separately billable procedures and treating other patients. Critical care was necessary to treat or prevent imminent or life-threatening deterioration. Critical care was time spent personally by me on the following activities: development of treatment plan with patient and/or surrogate as well as nursing, discussions with consultants, evaluation of patient's response to treatment, examination of patient, obtaining history from patient or surrogate, ordering and performing treatments and interventions, ordering and review of laboratory studies, ordering and review of radiographic studies, pulse oximetry and re-evaluation of patient's condition.   Patient's blood glucose is 1374.  Patient was started on her glucose reducing stabilizing protocol.  Patient started on IV fluids as well.  The patient to be admitted to the hospital for further evaluation care I spoke with the [facility] hospitalist who will admit the  patient for further evaluation.  Final Clinical Impression(s) / ED Diagnoses Final diagnoses:  None    Rx / DC Orders ED Discharge Orders    None       Rebeca Allegra 02/19/20 2119    Charlesetta Shanks, MD 02/19/20 2356

## 2020-02-19 NOTE — ED Triage Notes (Addendum)
To ED for eval of vomiting since this am. Pt is IIDDM - recently lowered insulin dose. Pt complains of diarrhea since last week. Mucous membranes appear dry. No pain. Pt appears weak. Noted RA sat of 80% - pt states he is on 02 during the day but didn't bring his bottle in.

## 2020-02-20 ENCOUNTER — Encounter (HOSPITAL_COMMUNITY): Payer: Self-pay | Admitting: Internal Medicine

## 2020-02-20 ENCOUNTER — Ambulatory Visit: Payer: Medicare HMO | Admitting: Cardiology

## 2020-02-20 ENCOUNTER — Other Ambulatory Visit: Payer: Self-pay

## 2020-02-20 DIAGNOSIS — N183 Chronic kidney disease, stage 3 unspecified: Secondary | ICD-10-CM

## 2020-02-20 DIAGNOSIS — E870 Hyperosmolality and hypernatremia: Secondary | ICD-10-CM | POA: Diagnosis present

## 2020-02-20 DIAGNOSIS — N1831 Chronic kidney disease, stage 3a: Secondary | ICD-10-CM

## 2020-02-20 DIAGNOSIS — E43 Unspecified severe protein-calorie malnutrition: Secondary | ICD-10-CM

## 2020-02-20 DIAGNOSIS — I10 Essential (primary) hypertension: Secondary | ICD-10-CM

## 2020-02-20 DIAGNOSIS — E1122 Type 2 diabetes mellitus with diabetic chronic kidney disease: Secondary | ICD-10-CM

## 2020-02-20 DIAGNOSIS — J449 Chronic obstructive pulmonary disease, unspecified: Secondary | ICD-10-CM

## 2020-02-20 DIAGNOSIS — I350 Nonrheumatic aortic (valve) stenosis: Secondary | ICD-10-CM

## 2020-02-20 DIAGNOSIS — I739 Peripheral vascular disease, unspecified: Secondary | ICD-10-CM

## 2020-02-20 LAB — BASIC METABOLIC PANEL
Anion gap: 11 (ref 5–15)
Anion gap: 11 (ref 5–15)
Anion gap: 16 — ABNORMAL HIGH (ref 5–15)
Anion gap: 15 (ref 5–15)
BUN: 56 mg/dL — ABNORMAL HIGH (ref 8–23)
BUN: 48 mg/dL — ABNORMAL HIGH (ref 8–23)
BUN: 54 mg/dL — ABNORMAL HIGH (ref 8–23)
BUN: 50 mg/dL — ABNORMAL HIGH (ref 8–23)
CO2: 30 mmol/L (ref 22–32)
CO2: 28 mmol/L (ref 22–32)
CO2: 26 mmol/L (ref 22–32)
CO2: 27 mmol/L (ref 22–32)
Calcium: 10.3 mg/dL (ref 8.9–10.3)
Calcium: 9.4 mg/dL (ref 8.9–10.3)
Calcium: 10.8 mg/dL — ABNORMAL HIGH (ref 8.9–10.3)
Calcium: 10.6 mg/dL — ABNORMAL HIGH (ref 8.9–10.3)
Chloride: 101 mmol/L (ref 98–111)
Chloride: 107 mmol/L (ref 98–111)
Chloride: 101 mmol/L (ref 98–111)
Chloride: 97 mmol/L — ABNORMAL LOW (ref 98–111)
Creatinine, Ser: 2.26 mg/dL — ABNORMAL HIGH (ref 0.61–1.24)
Creatinine, Ser: 2.04 mg/dL — ABNORMAL HIGH (ref 0.61–1.24)
Creatinine, Ser: 2.02 mg/dL — ABNORMAL HIGH (ref 0.61–1.24)
Creatinine, Ser: 2.02 mg/dL — ABNORMAL HIGH (ref 0.61–1.24)
GFR calc Af Amer: 34 mL/min — ABNORMAL LOW (ref >60)
GFR calc Af Amer: 38 mL/min — ABNORMAL LOW (ref >60)
GFR calc Af Amer: 38 mL/min — ABNORMAL LOW (ref >60)
GFR calc Af Amer: 38 mL/min — ABNORMAL LOW (ref >60)
GFR calc non Af Amer: 29 mL/min — ABNORMAL LOW (ref >60)
GFR calc non Af Amer: 33 mL/min — ABNORMAL LOW (ref >60)
GFR calc non Af Amer: 33 mL/min — ABNORMAL LOW (ref >60)
GFR calc non Af Amer: 33 mL/min — ABNORMAL LOW (ref >60)
Glucose, Bld: 505 mg/dL (ref 70–99)
Glucose, Bld: 278 mg/dL — ABNORMAL HIGH (ref 70–99)
Glucose, Bld: 237 mg/dL — ABNORMAL HIGH (ref 70–99)
Glucose, Bld: 346 mg/dL — ABNORMAL HIGH (ref 70–99)
Potassium: 3.4 mmol/L — ABNORMAL LOW (ref 3.5–5.1)
Potassium: 3 mmol/L — ABNORMAL LOW (ref 3.5–5.1)
Potassium: 5.2 mmol/L — ABNORMAL HIGH (ref 3.5–5.1)
Potassium: 5 mmol/L (ref 3.5–5.1)
Sodium: 142 mmol/L (ref 135–145)
Sodium: 146 mmol/L — ABNORMAL HIGH (ref 135–145)
Sodium: 143 mmol/L (ref 135–145)
Sodium: 139 mmol/L (ref 135–145)

## 2020-02-20 LAB — CBG MONITORING, ED
Glucose-Capillary: 136 mg/dL — ABNORMAL HIGH (ref 70–99)
Glucose-Capillary: 150 mg/dL — ABNORMAL HIGH (ref 70–99)
Glucose-Capillary: 179 mg/dL — ABNORMAL HIGH (ref 70–99)
Glucose-Capillary: 196 mg/dL — ABNORMAL HIGH (ref 70–99)
Glucose-Capillary: 221 mg/dL — ABNORMAL HIGH (ref 70–99)
Glucose-Capillary: 259 mg/dL — ABNORMAL HIGH (ref 70–99)
Glucose-Capillary: 299 mg/dL — ABNORMAL HIGH (ref 70–99)
Glucose-Capillary: 428 mg/dL — ABNORMAL HIGH (ref 70–99)
Glucose-Capillary: >600 mg/dL (ref 70–99)
Glucose-Capillary: >600 mg/dL (ref 70–99)
Glucose-Capillary: >600 mg/dL (ref 70–99)

## 2020-02-20 LAB — GLUCOSE, CAPILLARY
Glucose-Capillary: 306 mg/dL — ABNORMAL HIGH (ref 70–99)
Glucose-Capillary: 185 mg/dL — ABNORMAL HIGH (ref 70–99)

## 2020-02-20 LAB — PROTIME-INR
INR: 1.6 — ABNORMAL HIGH (ref 0.8–1.2)
Prothrombin Time: 18.3 seconds — ABNORMAL HIGH (ref 11.4–15.2)

## 2020-02-20 LAB — BETA-HYDROXYBUTYRIC ACID
Beta-Hydroxybutyric Acid: 0.16 mmol/L (ref 0.05–0.27)
Beta-Hydroxybutyric Acid: 0.21 mmol/L (ref 0.05–0.27)

## 2020-02-20 MED ORDER — INSULIN ASPART 100 UNIT/ML ~~LOC~~ SOLN
0.0000 [IU] | Freq: Three times a day (TID) | SUBCUTANEOUS | Status: DC
  Administered 2020-02-21: 2 [IU] via SUBCUTANEOUS
  Administered 2020-02-21: 5 [IU] via SUBCUTANEOUS
  Administered 2020-02-21: 3 [IU] via SUBCUTANEOUS

## 2020-02-20 MED ORDER — POTASSIUM CHLORIDE CRYS ER 20 MEQ PO TBCR
40.0000 meq | EXTENDED_RELEASE_TABLET | Freq: Once | ORAL | Status: AC
  Administered 2020-02-20: 40 meq via ORAL
  Filled 2020-02-20: qty 2

## 2020-02-20 MED ORDER — INSULIN ASPART 100 UNIT/ML ~~LOC~~ SOLN
0.0000 [IU] | Freq: Three times a day (TID) | SUBCUTANEOUS | Status: DC
  Administered 2020-02-20: 7 [IU] via SUBCUTANEOUS

## 2020-02-20 MED ORDER — INSULIN DETEMIR 100 UNIT/ML ~~LOC~~ SOLN
25.0000 [IU] | Freq: Every day | SUBCUTANEOUS | Status: DC
  Administered 2020-02-20 – 2020-02-21 (×2): 25 [IU] via SUBCUTANEOUS
  Filled 2020-02-20 (×3): qty 0.25

## 2020-02-20 MED ORDER — POTASSIUM CHLORIDE 10 MEQ/100ML IV SOLN
10.0000 meq | INTRAVENOUS | Status: AC
  Administered 2020-02-20 (×4): 10 meq via INTRAVENOUS
  Filled 2020-02-20 (×3): qty 100

## 2020-02-20 MED ORDER — WARFARIN - PHARMACIST DOSING INPATIENT: Freq: Every day | Status: DC

## 2020-02-20 MED ORDER — WARFARIN SODIUM 4 MG PO TABS
4.0000 mg | ORAL_TABLET | Freq: Once | ORAL | Status: AC
  Administered 2020-02-20: 4 mg via ORAL
  Filled 2020-02-20: qty 1

## 2020-02-20 MED ORDER — WARFARIN SODIUM 2 MG PO TABS
4.0000 mg | ORAL_TABLET | Freq: Once | ORAL | Status: AC
  Administered 2020-02-20: 4 mg via ORAL
  Filled 2020-02-20: qty 1
  Filled 2020-02-20: qty 2

## 2020-02-20 NOTE — ED Notes (Signed)
Lunch Tray Ordered @1144 .

## 2020-02-20 NOTE — Progress Notes (Signed)
ANTICOAGULATION CONSULT NOTE  Pharmacy Consult for Coumadin Indication: atrial fibrillation  No Known Allergies  Patient Measurements: Height: 5\' 11"  (180.3 cm) Weight: 72.1 kg (159 lb) IBW/kg (Calculated) : 75.3  Vital Signs: BP: 145/73 (04/27 1415) Pulse Rate: 86 (04/27 1415)  Labs: Recent Labs    02/19/20 1728 02/19/20 1728 02/19/20 2240 02/20/20 0239 02/20/20 0452 02/20/20 1232 02/20/20 1319  HGB 12.9*  --   --   --   --   --   --   HCT 44.2  --   --   --   --   --   --   PLT 192  --   --   --   --   --   --   LABPROT  --   --  18.7*  --   --   --  18.3*  INR  --   --  1.6*  --   --   --  1.6*  CREATININE 2.68*   < >  --  2.26* 2.04* 2.02*  --    < > = values in this interval not displayed.    Estimated Creatinine Clearance: 36.2 mL/min (A) (by C-G formula based on SCr of 2.02 mg/dL (H)).  Assessment: 69 y.o. male admitted with HHS continues on warfarin for history of afib. INR remains subtherapeutic at 1.6. No bleeding noted.   Goal of Therapy:  INR 2-3 Monitor platelets by anticoagulation protocol: Yes   Plan:  Repeat warfarin 4mg  PO x 1  Daily INR  Cenia Zaragosa, Rande Lawman 02/20/2020,2:32 PM

## 2020-02-20 NOTE — Progress Notes (Signed)
ANTICOAGULATION CONSULT NOTE - Initial Consult  Pharmacy Consult for Coumadin Indication: atrial fibrillation  No Known Allergies  Patient Measurements: Height: 5\' 11"  (180.3 cm) Weight: 72.1 kg (159 lb) IBW/kg (Calculated) : 75.3  Vital Signs: Temp: 97.8 F (36.6 C) (04/26 1924) Temp Source: Oral (04/26 1924) BP: 140/72 (04/26 2230) Pulse Rate: 75 (04/26 2230)  Labs: Recent Labs    02/19/20 1728 02/19/20 2240  HGB 12.9*  --   HCT 44.2  --   PLT 192  --   LABPROT  --  18.7*  INR  --  1.6*  CREATININE 2.68*  --     Estimated Creatinine Clearance: 27.3 mL/min (A) (by C-G formula based on SCr of 2.68 mg/dL (H)).   Medical History: Past Medical History:  Diagnosis Date  . Aortic insufficiency    MODERATE WITH A BICUSPID AORTIC VAVLE  . Arterial occlusive disease    MULTILEVEL  . CHF (congestive heart failure) ([place])   . COPD (chronic obstructive pulmonary disease) ([name])   . Dilated cardiomyopathy (HCC)    WITH EJECTION FRACTION DOWN TO 20-25%--WITH CONGESTIVE HEART FAILURE  . Edema    LOWER EXTREMETIES  . Hypertension   . Normal coronary arteries 2009  . Orthopnea   . Peripheral arterial disease ([place])   . Pulmonary hypertension ([place])   . SOB (shortness of breath)     Medications:  No current facility-administered medications on file prior to encounter.   Current Outpatient Medications on File Prior to Encounter  Medication Sig Dispense Refill  . amLODipine (NORVASC) 5 MG tablet TAKE 1 TABLET EVERY DAY (Patient taking differently: Take 5 mg by mouth daily. ) 90 tablet 2  . ANORO ELLIPTA 62.5-25 MCG/INH AEPB INHALE 1 PUFF BY MOUTH ONCE DAILY . APPOINTMENT REQUIRED FOR FUTURE REFILLS (Patient taking differently: Inhale 1 puff into the lungs daily. ) 60 each 0  . carvedilol (COREG) 6.25 MG tablet Take 1 tablet (6.25 mg total) by mouth 2 (two) times daily with a meal. 60 tablet 1  . furosemide (LASIX) 20 MG tablet Take 1 tablet (20 mg total) by mouth 2 (two)  times daily. 30 tablet 0  . furosemide (LASIX) 80 MG tablet Take 1 tablet (80 mg total) by mouth 2 (two) times daily. 180 tablet 0  . guaiFENesin (MUCINEX) 600 MG 12 hr tablet Take 2 tablets (1,200 mg total) by mouth 2 (two) times daily. (Patient taking differently: Take 600 mg by mouth 2 (two) times daily. ) 30 tablet 0  . HYDROcodone-acetaminophen (NORCO/VICODIN) 5-325 MG tablet Take 1 tablet by mouth daily as needed for severe pain.     Marland Kitchen insulin glargine (LANTUS) 100 UNIT/ML injection Inject 0.06 mLs (6 Units total) into the skin at bedtime. (Patient taking differently: Inject 8 Units into the skin at bedtime. ) 10 mL 11  . levETIRAcetam (KEPPRA) 500 MG tablet Take 1 tablet (500 mg total) by mouth 2 (two) times daily. 60 tablet 0  . Multiple Vitamin (MULTIVITAMIN WITH MINERALS) TABS tablet Take 1 tablet by mouth daily.    . OXYGEN Inhale 4.5 L into the lungs continuous.    . rosuvastatin (CRESTOR) 10 MG tablet Take 1 tablet (10 mg total) by mouth daily at 6 PM. (Patient taking differently: Take 10 mg by mouth daily. ) 90 tablet 0  . warfarin (COUMADIN) 2 MG tablet Take 1 tablet (2 mg total) by mouth daily at 6 PM. (Patient taking differently: Take 2 mg by mouth daily. ) 30 tablet 0  .  ACCU-CHEK FASTCLIX LANCETS MISC     . Accu-Chek FastClix Lancets MISC USE TO MONITOR BLOOD GLUCOSE 2 TIMES DAILY    . ACCU-CHEK GUIDE test strip     . BD PEN NEEDLE NANO U/F 32G X 4 MM MISC     . glucose blood (ACCU-CHEK GUIDE) test strip one strip (1 each dose) by Other route 2 (two) times daily. Test blood glucose 2 times a day.    . Insulin Pen Needle (BD PEN NEEDLE NANO U/F) 32G X 4 MM MISC USE TO INJECT INTO THE SKIN DAILY.       Assessment: 69 y.o. male admitted with HHS, h/o Afib, to continue Coumadin.  INR tonight subtherapeutic  Goal of Therapy:  INR 2-3 Monitor platelets by anticoagulation protocol: Yes   Plan:  Coumadin 4 mg tonight Daily INR  Caryl Pina 02/20/2020,12:30 AM

## 2020-02-20 NOTE — Progress Notes (Addendum)
Inpatient Diabetes Program Recommendations  AACE/ADA: New Consensus Statement on Inpatient Glycemic Control (2015)  Target Ranges:  Prepandial:   less than 140 mg/dL      Peak postprandial:   less than 180 mg/dL (1-2 hours)      Critically ill patients:  140 - 180 mg/dL   Lab Results  Component Value Date   GLUCAP 259 (H) 02/20/2020   HGBA1C 8.0 (H) 12/24/2019    Review of Glycemic Control Results for Kyle [name], Kyle [name] (MRN [id number]) as of 02/20/2020 14:17  Ref. Range 02/20/2020 06:47 02/20/2020 08:07 02/20/2020 08:51 02/20/2020 10:24 02/20/2020 11:52  Glucose-Capillary Latest Ref Range: 70 - 99 mg/dL 136 (H) 179 (H) 150 (H) 196 (H) 221 (H)    Diabetes history: DM2 Outpatient Diabetes medications: Lantus 6 units daily Current orders for Inpatient glycemic control: Lantus 25 units daily + Novolog 0-9 units tid    Note:  Spoke with patient and wife at bedside.  He has been admitted with HHS.  He was recently discharged 2 weeks from the hospital on 6 units of Lantus (down from his old home dose of 15 units daily).  His PCP Dr. Starla Link said he was worried that may not be enough insulin.  He has been admitted with a serum BS of 1357.  Was on IV insulin in the ED and has transitioned to SQ injections.  Reviewed patient's current A1c of 8% (average BS of 180mg /dl). Explained what a A1c is and what it measures. Also reviewed goal A1c with patient, importance of good glucose control @ home, and blood sugar goals.  He is current with his PCP.  He does drink sugary drinks and does not watch his CHO intake.  His wife states she is going to stop buying soda and has purchased an air fryer.  We reviewed The Plate Method.  He states he never has any low blood sugars but is aware of symptoms and how to treat.  He checks his blood sugar in the morning and before bedtime.  He denies difficulties obtaining insulin and supplies.  Will continue to follow while inpatient.  Thank you, Reche Dixon, RN,  BSN Diabetes Coordinator Inpatient Diabetes Program [phone] (team pager from 8a-5p)

## 2020-02-20 NOTE — Progress Notes (Addendum)
PROGRESS NOTE    Kyle [name]  TTS:[id number] DOB: 1951-08-31 DOA: 02/19/2020 PCP: Alonna Buckler, MD    Brief Narrative:  Patient was admitted to the hospital with a working diagnosis of hyperosmolar/ketotic state.  69 year old male who presented with abdominal pain.  He does have significant past medical history for type 2 diabetes mellitus, COPD, hypertension and peripheral vascular disease.  Patient reported diarrhea for about a week, complicated with nausea and vomiting. He had a recent hospitalization mid February/March for acute kidney injury, hyperkalemia, severe lactic acidosis, he required inpatient renal replacement therapy.  Late March he was readmitted for heart failure exacerbation, required diuresis with intravenous furosemide. On his initial physical examination blood pressure 132/73, pulse rate 94, respirate 20, temperature 98, oxygen saturation 80 to 94%, his lungs are clear to auscultation bilaterally, heart S1-2 present rhythmic, soft abdomen, no significant lower extremity edema.  Sodium 127, potassium 5.4, chloride 83, bicarb 27, glucose 1,357, BUN 63, creatinine 2.68, anion gap 17, white count 11.3, hemoglobin 12.9, hematocrit 44.2, platelets 197.  SARS COVID-19 negative.  EKG 91 bpm, normal axis, normal intervals, sinus rhythm, left atrial enlargement, no significant ST segment or T wave changes, V5 -V6 small Q waves   Assessment & Plan:   Principal Problem:   Hyperosmolar hyperglycemic state (HHS) ([place]) Active Problems:   Hypertension   Peripheral arterial disease (HCC)   Chronic systolic CHF (congestive heart failure) (HCC)   Aortic stenosis   Protein-calorie malnutrition, severe (HCC)   COPD with chronic bronchitis and emphysema (HCC)   Chronic respiratory failure with hypoxia (HCC)   Atrial fibrillation, chronic (HCC)   Diabetes mellitus type 2, insulin dependent (HCC)   Hyperosmolar syndrome   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[place])   1.  Hyperosmolar state with ketosis related to uncontrolled T2DM. Patient has responded well to insulin drip, approximate insulin use over last 3 hours, about 1,5 units per hour, for an estimated 36 units for 24 hours. Anion gap has closed and patient has no nausea or vomiting.   Advance diet to regular (diabetic prudent) and will transition patient with 70% of calculated insulin requirements with basal insulin of 25 units now and will use insulin sliding scale for further glucose cover and monitoring. Discontinue dextrose and insulin drip 2 H post basal insulin injection.   2. AKI on CKD stage 3b with hypokalemia. Renal function with serum cr at 2,0, K down to 3,0 and serum bicarbonate at 28.  Added this am 80 meq Kcl, 40 IV and 40 po, will continue close monitoring of renal function and electrolytes. Follow renal panel this pm. Acidosis is resolving,   3. Systolic heart failure. No signs of acute exacerbation, will hold on IV fluids and will advance diet as tolerated, continue telemetry monitoring.   4. COPD with chronic hypoxic respiratory failure. No signs of acute exacerbation, will continue oxymetry monitoring, continue with Anoro ellipta.   5. Paroxysmal atrial fibrillation. Will continue rate control with carvedilol, continue anticoagulation with warfarin. INR today 1,6  6. HTN/ dyslipidemia. Continue blood pressure monitoring, hold on diuretics for now. Continue with amlodipine and carvedilol.  Continue with statin therapy.   7. Protein calorie malnutrition/ severe. Will consult nutrition for recommendations. PT and OT   DVT prophylaxis: Warfarin   Code Status:    full  Family Communication:  No family at the bedside   Disposition Plan:   Patient is from:  Home   Anticipated DC to:  Home  Anticipated DC date:  To be determines   Anticipated DC barriers: Patient continue with uncontrolled hyperglycemia, renal failure and hypokalemia.      Subjective: Patient has no nausea or  vomiting this am, continue to be very weak and deconditioned, no chest pain or dyspnea.   Objective: Vitals:   02/20/20 1000 02/20/20 1030 02/20/20 1045 02/20/20 1115  BP: (!) 166/83 (!) 147/73 140/80 (!) 146/72  Pulse: 76 78 87 82  Resp: 18 16 18 13   Temp:      TempSrc:      SpO2: 97% 97% 96% 96%  Weight:      Height:        Intake/Output Summary (Last 24 hours) at 02/20/2020 1217 Last data filed at 02/20/2020 1114 Gross per 24 hour  Intake 400 ml  Output --  Net 400 ml   Filed Weights   02/19/20 1720  Weight: 72.1 kg    Examination:   General: Not in pain or dyspnea, deconditioned  Neurology: Awake and alert, non focal  E ENT: no pallor, no icterus, oral mucosa moist Cardiovascular: No JVD. S1-S2 present, rhythmic, no gallops, rubs, or murmurs. no lower extremity edema. Pulmonary: positive breath sounds bilaterally, adequate air movement, no wheezing, rhonchi or rales. Gastrointestinal. Abdomen with no organomegaly, non tender, no rebound or guarding Skin. No rashes Musculoskeletal: no joint deformities     Data Reviewed: I have personally reviewed following labs and imaging studies  CBC: Recent Labs  Lab 02/19/20 1728  WBC 11.3*  HGB 12.9*  HCT 44.2  MCV 97.8  PLT 939   Basic Metabolic Panel: Recent Labs  Lab 02/19/20 1728 02/20/20 0239 02/20/20 0452  NA 127* 142 146*  K 5.4* 3.4* 3.0*  CL 83* 101 107  CO2 27 30 28   GLUCOSE 1,357* 505* 278*  BUN 63* 56* 48*  CREATININE 2.68* 2.26* 2.04*  CALCIUM 10.6* 10.3 9.4   GFR: Estimated Creatinine Clearance: 35.8 mL/min (A) (by C-G formula based on SCr of 2.04 mg/dL (H)). Liver Function Tests: Recent Labs  Lab 02/19/20 1728  AST 16  ALT 25  ALKPHOS 149*  BILITOT 0.9  PROT 8.3*  ALBUMIN 4.2   Recent Labs  Lab 02/19/20 1728  LIPASE 48   No results for input(s): AMMONIA in the last 168 hours. Coagulation Profile: Recent Labs  Lab 02/19/20 2240  INR 1.6*   Cardiac Enzymes: No results  for input(s): CKTOTAL, CKMB, CKMBINDEX, TROPONINI in the last 168 hours. BNP (last 3 results) No results for input(s): PROBNP in the last 8760 hours. HbA1C: No results for input(s): HGBA1C in the last 72 hours. CBG: Recent Labs  Lab 02/20/20 0647 02/20/20 0807 02/20/20 0851 02/20/20 1024 02/20/20 1152  GLUCAP 136* 179* 150* 196* 221*   Lipid Profile: No results for input(s): CHOL, HDL, LDLCALC, TRIG, CHOLHDL, LDLDIRECT in the last 72 hours. Thyroid Function Tests: No results for input(s): TSH, T4TOTAL, FREET4, T3FREE, THYROIDAB in the last 72 hours. Anemia Panel: No results for input(s): VITAMINB12, FOLATE, FERRITIN, TIBC, IRON, RETICCTPCT in the last 72 hours.    Radiology Studies: I have reviewed all of the imaging during this hospital visit personally     Scheduled Meds: . amLODipine  5 mg Oral Daily  . carvedilol  6.25 mg Oral BID WC  . levETIRAcetam  500 mg Oral BID  . multivitamin with minerals  1 tablet Oral Daily  . rosuvastatin  10 mg Oral Daily  . sodium chloride flush  3 mL Intravenous Once  .  umeclidinium-vilanterol  1 puff Inhalation Daily  . Warfarin - Pharmacist Dosing Inpatient   Does not apply q1600   Continuous Infusions: . sodium chloride Stopped (02/20/20 0656)  . dextrose 5 % and 0.45% NaCl 75 mL/hr at 02/20/20 0656  . insulin 3 Units/hr (02/20/20 1155)  . potassium chloride 10 mEq (02/20/20 1156)     LOS: 1 day        Hila Bolding Gerome Apley, MD

## 2020-02-21 DIAGNOSIS — I480 Paroxysmal atrial fibrillation: Secondary | ICD-10-CM

## 2020-02-21 LAB — BASIC METABOLIC PANEL
Anion gap: 10 (ref 5–15)
BUN: 44 mg/dL — ABNORMAL HIGH (ref 8–23)
CO2: 30 mmol/L (ref 22–32)
Calcium: 9.9 mg/dL (ref 8.9–10.3)
Chloride: 98 mmol/L (ref 98–111)
Creatinine, Ser: 1.82 mg/dL — ABNORMAL HIGH (ref 0.61–1.24)
GFR calc Af Amer: 44 mL/min — ABNORMAL LOW (ref >60)
GFR calc non Af Amer: 38 mL/min — ABNORMAL LOW (ref >60)
Glucose, Bld: 146 mg/dL — ABNORMAL HIGH (ref 70–99)
Potassium: 4.5 mmol/L (ref 3.5–5.1)
Sodium: 138 mmol/L (ref 135–145)

## 2020-02-21 LAB — GLUCOSE, CAPILLARY
Glucose-Capillary: 146 mg/dL — ABNORMAL HIGH (ref 70–99)
Glucose-Capillary: 186 mg/dL — ABNORMAL HIGH (ref 70–99)
Glucose-Capillary: 218 mg/dL — ABNORMAL HIGH (ref 70–99)
Glucose-Capillary: 78 mg/dL (ref 70–99)

## 2020-02-21 LAB — PROTIME-INR
INR: 1.7 — ABNORMAL HIGH (ref 0.8–1.2)
Prothrombin Time: 19.2 seconds — ABNORMAL HIGH (ref 11.4–15.2)

## 2020-02-21 MED ORDER — WARFARIN SODIUM 2 MG PO TABS
4.0000 mg | ORAL_TABLET | Freq: Once | ORAL | Status: AC
  Administered 2020-02-21: 4 mg via ORAL
  Filled 2020-02-21: qty 2

## 2020-02-21 MED ORDER — INSULIN DETEMIR 100 UNIT/ML ~~LOC~~ SOLN
5.0000 [IU] | Freq: Once | SUBCUTANEOUS | Status: AC
  Administered 2020-02-21: 22:00:00 5 [IU] via SUBCUTANEOUS
  Filled 2020-02-21: qty 0.05

## 2020-02-21 MED ORDER — INSULIN DETEMIR 100 UNIT/ML ~~LOC~~ SOLN
30.0000 [IU] | Freq: Every day | SUBCUTANEOUS | Status: DC
  Filled 2020-02-21: qty 0.3

## 2020-02-21 MED ORDER — INSULIN ASPART 100 UNIT/ML ~~LOC~~ SOLN: 5.0000 [IU] | Freq: Three times a day (TID) | SUBCUTANEOUS | Status: DC

## 2020-02-21 MED ORDER — SALINE SPRAY 0.65 % NA SOLN
1.0000 | NASAL | Status: DC | PRN
  Administered 2020-02-21: 1 via NASAL
  Filled 2020-02-21: qty 44

## 2020-02-21 NOTE — Progress Notes (Signed)
ANTICOAGULATION CONSULT NOTE - Follow Up Consult  Pharmacy Consult for Coumadin Indication: atrial fibrillation  No Known Allergies  Patient Measurements: Height: 5\' 11"  (180.3 cm) Weight: 65.4 kg (144 lb 1.6 oz) IBW/kg (Calculated) : 75.3   Vital Signs: Temp: 97.5 F (36.4 C) (04/28 0829) Temp Source: Oral (04/28 0829) BP: 133/73 (04/28 0829) Pulse Rate: 87 (04/28 0829)  Labs: Recent Labs    02/19/20 1728 02/19/20 2240 02/20/20 0239 02/20/20 0452 02/20/20 1232 02/20/20 1319 02/20/20 1522 02/21/20 1020  HGB 12.9*  --   --   --   --   --   --   --   HCT 44.2  --   --   --   --   --   --   --   PLT 192  --   --   --   --   --   --   --   LABPROT  --  18.7*  --   --   --  18.3*  --  19.2*  INR  --  1.6*  --   --   --  1.6*  --  1.7*  CREATININE 2.68*  --    < > 2.04* 2.02*  --  2.02*  --    < > = values in this interval not displayed.    Estimated Creatinine Clearance: 32.8 mL/min (A) (by C-G formula based on SCr of 2.02 mg/dL (H)).  Assessment:  Anticoag: Warfarin for hx afib - INR 1.7, Hgb 12.9, plts 192 - briefly on apixaban but changed back to warfarin due to renal issues.  - PTA Coumadin 2mg  daily  Goal of Therapy:  INR 2-3 Monitor platelets by anticoagulation protocol: Yes   Plan:  Repeat Warfarin 4mg  PO x 1 again today Daily INR   Kyle [name], PharmD, BCPS Clinical Staff Pharmacist [email or web address] Alford [name], [employer] 02/21/2020,11:52 AM

## 2020-02-21 NOTE — Evaluation (Signed)
Occupational Therapy Evaluation Patient Details Name: Kyle [name] MRN: [id number] DOB: 07-28-1951 Today's Date: 02/21/2020    History of Present Illness 69 y.o. male with medical history significant of DM2, on insulin, AI, COPD on 5L, HTN, PAD. Pt developed N/V 4/25 with some diarrhea x1 week. Patient diagnosed with Hyperosmolar state with ketosis related to uncontrolled T2DM.     Clinical Impression   Patient participate in self care tasks in room with no physical assist or safety concerns present at this time. Will discontinue acute OT services at this time.    Follow Up Recommendations  No OT follow up    Equipment Recommendations  None recommended by OT       Precautions / Restrictions Precautions Precautions: None Restrictions Weight Bearing Restrictions: No      Mobility Bed Mobility Overal bed mobility: Modified Independent             General bed mobility comments: HOB elevated  Transfers Overall transfer level: Modified independent Equipment used: None             General transfer comment: increased time with sit to stand, no physical assist required    Balance Overall balance assessment: Mild deficits observed, not formally tested                                         ADL either performed or assessed with clinical judgement   ADL Overall ADL's : Independent;At baseline                                             Vision Baseline Vision/History: No visual deficits              Pertinent Vitals/Pain Pain Assessment: No/denies pain     Hand Dominance Right   Extremity/Trunk Assessment Upper Extremity Assessment Upper Extremity Assessment: Overall WFL for tasks assessed   Lower Extremity Assessment Lower Extremity Assessment: Defer to PT evaluation   Cervical / Trunk Assessment Cervical / Trunk Assessment: Normal   Communication Communication Communication: No difficulties   Cognition  Arousal/Alertness: Awake/alert Behavior During Therapy: WFL for tasks assessed/performed Overall Cognitive Status: Within Functional Limits for tasks assessed                                                Home Living Family/patient expects to be discharged to:: Private residence Living Arrangements: Spouse/significant other Available Help at Discharge: Family;Available 24 hours/day Type of Home: Apartment Home Access: Level entry     Home Layout: One level     Bathroom Shower/Tub: Tub/shower unit;Curtain   Biochemist, clinical: Standard     Home Equipment: None   Additional Comments: Home O2 at 4L      Prior Functioning/Environment Level of Independence: Independent                 OT Problem List: Decreased activity tolerance         OT Goals(Current goals can be found in the care plan section) Acute Rehab OT Goals Patient Stated Goal: to go home OT Goal Formulation: With patient   AM-PAC OT "6 Clicks" Daily Activity  Outcome Measure Help from another person eating meals?: None Help from another person taking care of personal grooming?: None Help from another person toileting, which includes using toliet, bedpan, or urinal?: None Help from another person bathing (including washing, rinsing, drying)?: None Help from another person to put on and taking off regular upper body clothing?: None Help from another person to put on and taking off regular lower body clothing?: None 6 Click Score: 24   End of Session Equipment Utilized During Treatment: Oxygen  Activity Tolerance: Patient tolerated treatment well Patient left: in bed;with call bell/phone within reach  OT Visit Diagnosis: Other abnormalities of gait and mobility (R26.89)                Time: 2482-5003 OT Time Calculation (min): 25 min Charges:  OT General Charges $OT Visit: 1 Visit OT Evaluation $OT Eval Moderate Complexity: [place] 02/21/2020, 8:19 AM

## 2020-02-21 NOTE — Discharge Instructions (Signed)

## 2020-02-21 NOTE — Progress Notes (Signed)
PROGRESS NOTE    Kyle [name]  OQH:[id number] DOB: [birth year] DOA: 02/19/2020 PCP: Alonna Buckler, MD     Brief Narrative:  69 y.o. BM PMHx  DM Type 2 uncontrolled with complication, on insulin, AI, COPD on 4L at home, HTN, HLD, PAD s/p RIGHT fem-pop BPG.  Pt with extensive recent hospital history: Admitted end of feb-mid march with AKF, hyperkalemia, severe lactic acidosis unclear etiology.  Required IP dialysis during that admit, ultimately able to be weaned off of dialysis and discharged.  Readmitted at end of march for CHF exacerbation due to lasix having been discontinued during prior hospital stay (due to severe AKF).  Diuresed with IV lasix, transitioned to PO.  Looks like new baseline creat was about 2.5  As outpt, had insulin decreased recently per patient.  Developed N/V this morning, has had some diarrhea since last week as well.  Generalized weakness.  No fever, no CP, SOB, headache, cough, etc.   ED Course: AG 17, no keytones in urine.  But BGL is 1357!  Creat 2.6 with BUN 60.  Sodium (uncorrected) 127.  2L NS bolus in ED ordered, and hospitalist asked to admit.  Review of Systems: As per HPI, otherwise all review of systems negative.   Subjective: A/O x4, chronic S OB, negative CP, negative abdominal pain.  Does not weigh himself daily.  Unknown base weight.  Does not know who his cardiologist is, states weighs usually around 150 pounds.    Assessment & Plan:   Principal Problem:   Hyperosmolar hyperglycemic state (HHS) ([place]) Active Problems:   Hypertension   Peripheral arterial disease (HCC)   Chronic systolic CHF (congestive heart failure) (HCC)   Aortic stenosis   Protein-calorie malnutrition, severe (HCC)   COPD with chronic bronchitis and emphysema (HCC)   Chronic respiratory failure with hypoxia (HCC)   Atrial fibrillation, chronic ([place])   Diabetes mellitus type 2, insulin dependent ([place])   Hyperosmolar syndrome   CKD stage 3  secondary to diabetes ([place])  HHNS -Patient seen by diabetic coordinator -4/28 diabetic nutrition consult -Off insulin drip -If patient CBG remains<200 overnight will discharge on 4/29 -4/28 increase Levemir 30 units daily -4/28 NovoLog 5 units qac -4/28 DC moderate SSI; CBG q 4hr  Chronic systolic CHF -Dr. Peter Martinique is patient's cardiologist appears last seen 2/4 at that visit was instructed to follow-up in 6 months. -However significant changes in patient's echocardiogram.  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  Contacted Tr[facility] and discussed case as to if they wanted [place] to begin work-up in the hospital or did they want to see patient and work-up as outpatient.  I was informed they would see patient when he was discharged and they would work-up changes as outpatient. -Prior to discharge schedule appointment with Dr. Peter Martinique cardiology work-up for infiltrative cardiomyopathy.   -Strict in and out +1.7 L -Daily weight -Amlodipine 5 mg daily -Coreg 6.25 mgBID  Essential HTN -See CHF  Paroxysmal atrial fibrillation -See CHF -Warfarin per pharmacy Recent Labs  Lab 02/19/20 2240 02/20/20 1319 02/21/20 1020  INR 1.6* 1.6* 1.7*  -4/28 pharmacy administered additional dose Coumadin. -If patient subtherapeutic in a.m. and ready for discharge could cover him with Lovenox.  COPD/Chronic Respiratory failure with hypoxia -Patient now back on home dose of O2, 4 L via [place]. -Ellipta 62.5-25 mcg/INH daily -Schedule follow-up appointment in 1 to 2 weeks with NP [name], COPD, lung cancer screening.  CKD stage IV (baseline Cr=2.4) Recent Labs  Lab 02/20/20 0239 02/20/20 0452 02/20/20 1232 02/20/20 1522 02/21/20 1158  CREATININE 2.26* 2.04* 2.02* 2.02* 1.82*  -Patient's creatinine currently better than baseline.    DVT prophylaxis: Coumadin Code Status: Full Family Communication:  Disposition Plan:  1.  Where the patient is from 2.   Anticipated d/c place. 3.  Barriers to d/c DC 4/29 if diabetes controlled.    Consultants:    Procedures/Significant Events:  2/17 echocardiogram;Left Ventricle: Impaired GLS: -14.6%. Left ventricular ejection fraction,  by estimation, is 65 to 70%. The left ventricle has normal function. The  left ventricle has no regional wall motion abnormalities. The left  ventricular internal cavity size was  normal in size. There is severe concentric left ventricular hypertrophy.  Left ventricular diastolic parameters are consistent with Grade I  diastolic dysfunction (impaired relaxation). Normal left ventricular  filling pressure.   Right Ventricle: The right ventricular size is normal. No increase in  right ventricular wall thickness. Right ventricular systolic function is  normal. There is moderately elevated pulmonary artery systolic pressure.  The tricuspid regurgitant velocity is  3.88 m/s, and with an assumed right atrial pressure of 3 mmHg, the  estimated right ventricular systolic pressure is 01.7 mmHg.   Left Atrium: Left atrial size was normal in size.   Right Atrium: Right atrial size was normal in size.   Pericardium: There is no evidence of pericardial effusion.   Mitral Valve: The mitral valve is normal in structure and function. Normal  mobility of the mitral valve leaflets. No evidence of mitral valve  regurgitation. No evidence of mitral valve stenosis.   Tricuspid Valve: The tricuspid valve is normal in structure. Tricuspid  valve regurgitation is mild . No evidence of tricuspid stenosis.   Aortic Valve: The aortic valve is normal in structure and function.. There  is severe thickening and severe calcifcation of the aortic valve. Aortic  valve regurgitation is moderate. Aortic regurgitation PHT measures 714  msec. Mild to moderate aortic  stenosis is present. There is severe thickening of the aortic valve. There  is severe calcifcation of the aortic valve. Aortic  valve mean gradient  measures 13.3 mmHg. Aortic valve peak gradient measures 22.9 mmHg. Aortic  valve area, by VTI measures 1.92  cm.   Pulmonic Valve: The pulmonic valve was normal in structure. Pulmonic valve  regurgitation is not visualized. No evidence of pulmonic stenosis.   Aorta: The aortic root is normal in size and structure.   Venous: The inferior vena cava is normal in size with greater than 50%  respiratory variability, suggesting right atrial pressure of 3 mmHg.   IAS/Shunts: No atrial level shunt detected by color flow Doppler.   Additional Comments: Severe concentric LVH, consider evaluation for  infiltrative cardiomyopathies such as cardiac amyloidosis.  --------------------------------------------------------------------------------------------------------------------------   I have personally reviewed and interpreted all radiology studies and my findings are as above.  VENTILATOR SETTINGS:    Cultures   Antimicrobials:    Devices    LINES / TUBES:      Continuous Infusions:   Objective: Vitals:   02/20/20 2056 02/21/20 0038 02/21/20 0431 02/21/20 0829  BP: 118/72 133/77 140/80 133/73  Pulse: 91 92 92 87  Resp: 18 18 20 18   Temp: 98.8 F (37.1 C) 98.4 F (36.9 C) 97.8 F (36.6 C) (!) 97.5 F (36.4 C)  TempSrc: Oral Oral Oral Oral  SpO2: 91% 96% 92% 100%  Weight:   65.4 kg   Height:  Intake/Output Summary (Last 24 hours) at 02/21/2020 1102 Last data filed at 02/21/2020 0831 Gross per 24 hour  Intake 1426 ml  Output 300 ml  Net 1126 ml   Filed Weights   02/19/20 1720 02/20/20 1518 02/21/20 0431  Weight: 72.1 kg 65.3 kg 65.4 kg    Examination:  General: A/O x4, positive chronic respiratory distress, cachectic Eyes: negative scleral hemorrhage, negative anisocoria, negative icterus ENT: Negative Runny nose, negative gingival bleeding, Neck:  Negative scars, masses, torticollis, lymphadenopathy, JVD Lungs: Clear to  auscultation bilaterally without wheezes or crackles Cardiovascular: Regular rate and rhythm without murmur gallop or rub normal S1 and S2 Abdomen: negative abdominal pain, nondistended, positive soft, bowel sounds, no rebound, no ascites, no appreciable mass Extremities: No significant cyanosis, clubbing, or edema bilateral lower extremities Skin: Negative rashes, lesions, ulcers Psychiatric:  Negative depression, negative anxiety, negative fatigue, negative mania  Central nervous system:  Cranial nerves II through XII intact, tongue/uvula midline, all extremities muscle strength 5/5, sensation intact throughout, negative dysarthria, negative expressive aphasia, negative receptive aphasia.  .     Data Reviewed: Care during the described time interval was provided by me .  I have reviewed this patient's available data, including medical history, events of note, physical examination, and all test results as part of my evaluation.  CBC: Recent Labs  Lab 02/19/20 1728  WBC 11.3*  HGB 12.9*  HCT 44.2  MCV 97.8  PLT 017   Basic Metabolic Panel: Recent Labs  Lab 02/19/20 1728 02/20/20 0239 02/20/20 0452 02/20/20 1232 02/20/20 1522  NA 127* 142 146* 143 139  K 5.4* 3.4* 3.0* 5.2* 5.0  CL 83* 101 107 101 97*  CO2 27 30 28 26 27   GLUCOSE 1,357* 505* 278* 237* 346*  BUN 63* 56* 48* 54* 50*  CREATININE 2.68* 2.26* 2.04* 2.02* 2.02*  CALCIUM 10.6* 10.3 9.4 10.8* 10.6*   GFR: Estimated Creatinine Clearance: 32.8 mL/min (A) (by C-G formula based on SCr of 2.02 mg/dL (H)). Liver Function Tests: Recent Labs  Lab 02/19/20 1728  AST 16  ALT 25  ALKPHOS 149*  BILITOT 0.9  PROT 8.3*  ALBUMIN 4.2   Recent Labs  Lab 02/19/20 1728  LIPASE 48   No results for input(s): AMMONIA in the last 168 hours. Coagulation Profile: Recent Labs  Lab 02/19/20 2240 02/20/20 1319  INR 1.6* 1.6*   Cardiac Enzymes: No results for input(s): CKTOTAL, CKMB, CKMBINDEX, TROPONINI in the last 168  hours. BNP (last 3 results) No results for input(s): PROBNP in the last 8760 hours. HbA1C: No results for input(s): HGBA1C in the last 72 hours. CBG: Recent Labs  Lab 02/20/20 1152 02/20/20 1434 02/20/20 1707 02/20/20 2134 02/21/20 0608  GLUCAP 221* 259* 306* 185* 146*   Lipid Profile: No results for input(s): CHOL, HDL, LDLCALC, TRIG, CHOLHDL, LDLDIRECT in the last 72 hours. Thyroid Function Tests: No results for input(s): TSH, T4TOTAL, FREET4, T3FREE, THYROIDAB in the last 72 hours. Anemia Panel: No results for input(s): VITAMINB12, FOLATE, FERRITIN, TIBC, IRON, RETICCTPCT in the last 72 hours. Sepsis Labs: No results for input(s): PROCALCITON, LATICACIDVEN in the last 168 hours.  Recent Results (from the past 240 hour(s))  Respiratory Panel by RT PCR (Flu A&B, Covid) - Nasopharyngeal Swab     Status: None   Collection Time: 02/19/20  9:34 PM   Specimen: Nasopharyngeal Swab  Result Value Ref Range Status   SARS Coronavirus 2 by RT PCR NEGATIVE NEGATIVE Final    Comment: (NOTE) SARS-CoV-2 target nucleic  acids are NOT DETECTED. The SARS-CoV-2 RNA is generally detectable in upper respiratoy specimens during the acute phase of infection. The lowest concentration of SARS-CoV-2 viral copies this assay can detect is 131 copies/mL. A negative result does not preclude SARS-Cov-2 infection and should not be used as the sole basis for treatment or other patient management decisions. A negative result may occur with  improper specimen collection/handling, submission of specimen other than nasopharyngeal swab, presence of viral mutation(s) within the areas targeted by this assay, and inadequate number of viral copies (<131 copies/mL). A negative result must be combined with clinical observations, patient history, and epidemiological information. The expected result is Negative. Fact Sheet for Patients:  PinkCheek.be Fact Sheet for Healthcare Providers:   GravelBags.it This test is not yet ap proved or cleared by the [place] FDA and  has been authorized for detection and/or diagnosis of SARS-CoV-2 by FDA under an Emergency Use Authorization (EUA). This EUA will remain  in effect (meaning this test can be used) for the duration of the COVID-19 declaration under Section 564(b)(1) of the Act, 21 U.S.C. section 360bbb-3(b)(1), unless the authorization is terminated or revoked sooner.    Influenza A by PCR NEGATIVE NEGATIVE Final   Influenza B by PCR NEGATIVE NEGATIVE Final    Comment: (NOTE) The Xpert Xpress SARS-CoV-2/FLU/RSV assay is intended as an aid in  the diagnosis of influenza from Nasopharyngeal swab specimens and  should not be used as a sole basis for treatment. Nasal washings and  aspirates are unacceptable for Xpert Xpress SARS-CoV-2/FLU/RSV  testing. Fact Sheet for Patients: PinkCheek.be Fact Sheet for Healthcare Providers: GravelBags.it This test is not yet approved or cleared by the [place] FDA and  has been authorized for detection and/or diagnosis of SARS-CoV-2 by  FDA under an Emergency Use Authorization (EUA). This EUA will remain  in effect (meaning this test can be used) for the duration of the  Covid-19 declaration under Section 564(b)(1) of the Act, 21  U.S.C. section 360bbb-3(b)(1), unless the authorization is  terminated or revoked. Performed at [facility], [place] [address], [place], [place] [address]          Radiology Studies: No results found.      Scheduled Meds: . amLODipine  5 mg Oral Daily  . carvedilol  6.25 mg Oral BID WC  . insulin aspart  0-15 Units Subcutaneous TID WC  . insulin detemir  25 Units Subcutaneous Daily  . levETIRAcetam  500 mg Oral BID  . multivitamin with minerals  1 tablet Oral Daily  . rosuvastatin  10 mg Oral Daily  . sodium chloride flush  3 mL Intravenous Once   . umeclidinium-vilanterol  1 puff Inhalation Daily  . Warfarin - Pharmacist Dosing Inpatient   Does not apply q1600   Continuous Infusions:   LOS: 2 days    Time spent:40 min    Conda Wannamaker, Geraldo Docker, MD [facility] Pager [phone]  If 7PM-7AM, please contact night-coverage [email or web address] Password [email or web address] 02/21/2020, 11:02 AM

## 2020-02-21 NOTE — Progress Notes (Signed)
Nutrition Brief Note  RD consulted for assessment of nutrition requirements and diet education.  Wt Readings from Last 15 Encounters:  02/21/20 65.4 kg  01/29/20 66.2 kg  01/24/20 65.5 kg  01/10/20 76.5 kg  11/30/19 71.7 kg  10/13/19 68.5 kg  07/14/19 68.5 kg  05/15/19 68.5 kg  04/22/19 67.4 kg  04/21/19 68.5 kg  04/05/19 68.8 kg  12/16/18 69.3 kg  12/06/18 67.9 kg  11/30/18 68 kg  01/24/18 74.5 kg   Kyle [name] is a 69 y.o. male with medical history significant of DM2, on insulin, AI, COPD on 5L, HTN, PAD. Admitted with HHS.  Pt admitted with HHS.   Reviewed I/O's: +966 ml x 24 hours and +1.1 L since admission  UOP: 200 ml x 24 hours  Attempted to speak with pt, however, did not arouse to voice. No family members at bedside.   Pt with good appetite. Noted meal completion 50-100%.   Wt has been stable over the past year.   Nutrition-Focused physical exam completed. Findings are no fat depletion, no muscle depletion, and no edema.   Current diet order is heart healthy/ carb modified, patient is consuming approximately 50-100% of meals at this time. Labs and medications reviewed.   Lab Results  Component Value Date   HGBA1C 8.0 (H) 12/24/2019   PTA DM medications are 8 units insulin glargine q HS. Per DM coordinator note, pt had been drinking sugary beverages at home. RD provided "Carbohydrate Counting for People with Diabetes" handout from [facility] Nutrition Care Manual.   Labs reviewed: CBGS: 146-306 (inpatient orders for glycemic control are 0-15 units inuslin aspart TID and 25 units insulin detemir daily).   No nutrition interventions warranted at this time. If nutrition issues arise, please consult RD.   Loistine Chance, RD, LDN, [place] Registered Dietitian II Certified Diabetes Care and Education Specialist Please refer to [facility] for RD and/or RD on-call/weekend/after hours pager

## 2020-02-21 NOTE — Evaluation (Signed)
Physical Therapy Evaluation Patient Details Name: Kyle [name] MRN: [id number] DOB: [birth year] Today's Date: 02/21/2020   History of Present Illness  Pt is a 69 y.o. male with medical history significant of DM2, on insulin, AI, COPD on 5L, HTN, PAD. Pt developed N/V 4/25 with some diarrhea x1 week. Patient diagnosed with Hyperosmolar state with ketosis related to uncontrolled T2DM.      Clinical Impression  Pt presented supine in bed with HOB elevated, awake and willing to participate in therapy session. Prior to admission, pt reported that he was independent with all functional mobility and ADLs. Pt lives with his spouse in a second level apartment with a full flight of stairs to enter. At the time of evaluation, pt overall at a supervision to min guard level with functional mobility without use of an AD to ambulate. Pt on 4L of O2 throughout with SPO2 maintaining 88-91% with activity. PT will continue to follow pt acutely to progress mobility as tolerated per PT POC.     Follow Up Recommendations No PT follow up    Equipment Recommendations  None recommended by PT    Recommendations for Other Services       Precautions / Restrictions Precautions Precautions: Other (comment) Precaution Comments: monitor SPO2 Restrictions Weight Bearing Restrictions: No      Mobility  Bed Mobility Overal bed mobility: Modified Independent             General bed mobility comments: HOB elevated  Transfers Overall transfer level: Needs assistance Equipment used: None Transfers: Sit to/from Stand Sit to Stand: Supervision         General transfer comment: increased time with sit to stand, no physical assist required  Ambulation/Gait Ambulation/Gait assistance: Supervision;Min guard Gait Distance (Feet): 40 Feet Assistive device: None Gait Pattern/deviations: Step-through pattern;Decreased stride length Gait velocity: decreased   General Gait Details: pt with slow, cautious gait  in room; not using an AD but occasionally reaching for support surfaces for stability; no overt LOB or need for physical assistance  Stairs            Wheelchair Mobility    Modified Rankin (Stroke Patients Only)       Balance Overall balance assessment: Mild deficits observed, not formally tested                                           Pertinent Vitals/Pain Pain Assessment: No/denies pain    Home Living Family/patient expects to be discharged to:: Private residence Living Arrangements: Spouse/significant other Available Help at Discharge: Family;Available 24 hours/day Type of Home: Apartment Home Access: Level entry     Home Layout: One level Home Equipment: None Additional Comments: Home O2 at 4L    Prior Function Level of Independence: Independent               Hand Dominance   Dominant Hand: Right    Extremity/Trunk Assessment   Upper Extremity Assessment Upper Extremity Assessment: Defer to OT evaluation;Overall [facility] for tasks assessed    Lower Extremity Assessment Lower Extremity Assessment: Overall WFL for tasks assessed    Cervical / Trunk Assessment Cervical / Trunk Assessment: Normal  Communication   Communication: No difficulties  Cognition Arousal/Alertness: Awake/alert Behavior During Therapy: WFL for tasks assessed/performed Overall Cognitive Status: Within Functional Limits for tasks assessed  General Comments      Exercises     Assessment/Plan    PT Assessment Patient needs continued PT services  PT Problem List Decreased balance;Decreased mobility;Decreased coordination;Cardiopulmonary status limiting activity       PT Treatment Interventions DME instruction;Gait training;Stair training;Functional mobility training;Therapeutic activities;Therapeutic exercise;Balance training;Neuromuscular re-education;Patient/family education    PT Goals  (Current goals can be found in the Care Plan section)  Acute Rehab PT Goals Patient Stated Goal: "go home today" PT Goal Formulation: With patient Time For Goal Achievement: 03/06/20 Potential to Achieve Goals: Good    Frequency Min 3X/week   Barriers to discharge        Co-evaluation               AM-PAC PT "6 Clicks" Mobility  Outcome Measure Help needed turning from your back to your side while in a flat bed without using bedrails?: None Help needed moving from lying on your back to sitting on the side of a flat bed without using bedrails?: None Help needed moving to and from a bed to a chair (including a wheelchair)?: None Help needed standing up from a chair using your arms (e.g., wheelchair or bedside chair)?: None Help needed to walk in hospital room?: A Little Help needed climbing 3-5 steps with a railing? : A Little 6 Click Score: 22    End of Session Equipment Utilized During Treatment: Oxygen Activity Tolerance: Patient tolerated treatment well Patient left: in bed;with call bell/phone within reach;Other (comment)(sitting EOB) Nurse Communication: Mobility status PT Visit Diagnosis: Other abnormalities of gait and mobility (R26.89)    Time: 8757-9728 PT Time Calculation (min) (ACUTE ONLY): 13 min   Charges:   PT Evaluation $PT Eval Low Complexity: 1 Low          Eduard Clos, PT, DPT  Acute Rehabilitation Services Pager [phone] Office [place] 02/21/2020, 11:37 AM

## 2020-02-22 LAB — BASIC METABOLIC PANEL
Anion gap: 9 (ref 5–15)
BUN: 39 mg/dL — ABNORMAL HIGH (ref 8–23)
CO2: 28 mmol/L (ref 22–32)
Calcium: 9.7 mg/dL (ref 8.9–10.3)
Chloride: 103 mmol/L (ref 98–111)
Creatinine, Ser: 1.74 mg/dL — ABNORMAL HIGH (ref 0.61–1.24)
GFR calc Af Amer: 46 mL/min — ABNORMAL LOW (ref >60)
GFR calc non Af Amer: 40 mL/min — ABNORMAL LOW (ref >60)
Glucose, Bld: 67 mg/dL — ABNORMAL LOW (ref 70–99)
Potassium: 4.6 mmol/L (ref 3.5–5.1)
Sodium: 140 mmol/L (ref 135–145)

## 2020-02-22 LAB — CBC
HCT: 36.1 % — ABNORMAL LOW (ref 39.0–52.0)
Hemoglobin: 11.6 g/dL — ABNORMAL LOW (ref 13.0–17.0)
MCH: 28.9 pg (ref 26.0–34.0)
MCHC: 32.1 g/dL (ref 30.0–36.0)
MCV: 89.8 fL (ref 80.0–100.0)
Platelets: 145 10*3/uL — ABNORMAL LOW (ref 150–400)
RBC: 4.02 MIL/uL — ABNORMAL LOW (ref 4.22–5.81)
RDW: 17.2 % — ABNORMAL HIGH (ref 11.5–15.5)
WBC: 9.5 10*3/uL (ref 4.0–10.5)
nRBC: 0 % (ref 0.0–0.2)

## 2020-02-22 LAB — GLUCOSE, CAPILLARY
Glucose-Capillary: 132 mg/dL — ABNORMAL HIGH (ref 70–99)
Glucose-Capillary: 142 mg/dL — ABNORMAL HIGH (ref 70–99)
Glucose-Capillary: 178 mg/dL — ABNORMAL HIGH (ref 70–99)
Glucose-Capillary: 61 mg/dL — ABNORMAL LOW (ref 70–99)
Glucose-Capillary: 82 mg/dL (ref 70–99)
Glucose-Capillary: 93 mg/dL (ref 70–99)
Glucose-Capillary: 95 mg/dL (ref 70–99)

## 2020-02-22 LAB — MAGNESIUM: Magnesium: 2.3 mg/dL (ref 1.7–2.4)

## 2020-02-22 LAB — PHOSPHORUS: Phosphorus: 3.5 mg/dL (ref 2.5–4.6)

## 2020-02-22 LAB — PROTIME-INR
INR: 1.7 — ABNORMAL HIGH (ref 0.8–1.2)
Prothrombin Time: 19.8 seconds — ABNORMAL HIGH (ref 11.4–15.2)

## 2020-02-22 MED ORDER — INSULIN ASPART 100 UNIT/ML ~~LOC~~ SOLN
3.0000 [IU] | Freq: Three times a day (TID) | SUBCUTANEOUS | Status: DC
  Administered 2020-02-22 – 2020-02-23 (×4): 3 [IU] via SUBCUTANEOUS

## 2020-02-22 MED ORDER — INSULIN ASPART 100 UNIT/ML ~~LOC~~ SOLN: 5.0000 [IU] | Freq: Three times a day (TID) | SUBCUTANEOUS | Status: DC

## 2020-02-22 MED ORDER — WARFARIN SODIUM 3 MG PO TABS
6.0000 mg | ORAL_TABLET | Freq: Once | ORAL | Status: AC
  Administered 2020-02-22: 18:00:00 6 mg via ORAL
  Filled 2020-02-22: qty 2

## 2020-02-22 MED ORDER — INSULIN DETEMIR 100 UNIT/ML ~~LOC~~ SOLN
25.0000 [IU] | Freq: Every day | SUBCUTANEOUS | Status: DC
  Administered 2020-02-22 – 2020-02-23 (×2): 25 [IU] via SUBCUTANEOUS
  Filled 2020-02-22 (×2): qty 0.25

## 2020-02-22 NOTE — Progress Notes (Signed)
Inpatient Diabetes Program Recommendations  AACE/ADA: New Consensus Statement on Inpatient Glycemic Control (2015)  Target Ranges:  Prepandial:   less than 140 mg/dL      Peak postprandial:   less than 180 mg/dL (1-2 hours)      Critically ill patients:  140 - 180 mg/dL   Lab Results  Component Value Date   GLUCAP 142 (H) 02/22/2020   HGBA1C 8.0 (H) 12/24/2019    Review of Glycemic Control Results for Kyle Kyle [name], Kyle [name] (MRN [id number]) as of 02/22/2020 09:48  Ref. Range 02/22/2020 00:12 02/22/2020 04:12 02/22/2020 04:40 02/22/2020 08:00  Glucose-Capillary Latest Ref Range: 70 - 99 mg/dL 82 61 (L) 93 142 (H)   Diabetes history: DM2 Outpatient Diabetes medications: Lantus 6 units daily Current orders for Inpatient glycemic control: Lantus 25 units daily + Novolog 0-9 units tid   Inpatient Diabetes Program Recommendations:    Noted hypoglycemia this AM of 61 mg/dL following increase to Levemir.   In preparation for discharge, consider decreasing Levemir dose back to 25 units QD and changing meal coverage to 3 units TID (assuming patient is consuming >50% of meal) in an effort to prevent hypoglycemia.  -Secure chat sent to RN to notify plan for decrease. Secure chat sent to MD with recommendations concerning AM doses of Levemir.  Thanks, Bronson Curb, MSN, RNC-OB Diabetes Coordinator [phone] (8a-5p)

## 2020-02-22 NOTE — Progress Notes (Addendum)
Physical Therapy Treatment Patient Details Name: Kyle [name] MRN: [id number] DOB: 06/05/1951 Today's Date: 02/22/2020    History of Present Illness Pt is a 69 y.o. male with medical history significant of DM2, on insulin, AI, COPD on 5L, HTN, PAD. Pt developed N/V 4/25 with some diarrhea x1 week. Patient diagnosed with Hyperosmolar state with ketosis related to uncontrolled T2DM.      PT Comments    Pt received in bed. NT just finished setting pt up to bathe at sink. Pt agreeable to ambulation in room prior to bathing. He required supervision transfers and S/min guard assist ambulation in room 60' without AD. Pt mobilized on 4L with desat to 81%. 88% at rest. Quick return to 88% upon sitting. Spoke to pt about energy conservation techniques.   Follow Up Recommendations  No PT follow up     Equipment Recommendations  None recommended by PT    Recommendations for Other Services       Precautions / Restrictions Precautions Precautions: Other (comment) Precaution Comments: monitor SPO2 Restrictions Weight Bearing Restrictions: No    Mobility  Bed Mobility Overal bed mobility: Modified Independent             General bed mobility comments: HOB elevated  Transfers Overall transfer level: Needs assistance Equipment used: None Transfers: Sit to/from Stand;Stand Pivot Transfers Sit to Stand: Supervision Stand pivot transfers: Supervision       General transfer comment: supervision for safety, increased time  Ambulation/Gait Ambulation/Gait assistance: Supervision;Min guard Gait Distance (Feet): 60 Feet Assistive device: None Gait Pattern/deviations: Step-through pattern;Decreased stride length Gait velocity: decreased   General Gait Details: Slow, cautious gait. No AD but occasionally reaching for support surfaces. No overt LOB or physical assist. Mobilized on 4L with desat to 81%. SpO2 88% at rest.   Stairs             Wheelchair Mobility     Modified Rankin (Stroke Patients Only)       Balance Overall balance assessment: Mild deficits observed, not formally tested                                          Cognition Arousal/Alertness: Awake/alert Behavior During Therapy: WFL for tasks assessed/performed Overall Cognitive Status: Within Functional Limits for tasks assessed                                        Exercises      General Comments        Pertinent Vitals/Pain Pain Assessment: No/denies pain    Home Living                      Prior Function            PT Goals (current goals can now be found in the care plan section) Acute Rehab PT Goals Patient Stated Goal: home ASAP Progress towards PT goals: Progressing toward goals    Frequency    Min 3X/week      PT Plan Current plan remains appropriate    Co-evaluation              AM-PAC PT "6 Clicks" Mobility   Outcome Measure  Help needed turning from your back to your side while in a flat bed without  using bedrails?: None Help needed moving from lying on your back to sitting on the side of a flat bed without using bedrails?: None Help needed moving to and from a bed to a chair (including a wheelchair)?: None Help needed standing up from a chair using your arms (e.g., wheelchair or bedside chair)?: None Help needed to walk in hospital room?: A Little Help needed climbing 3-5 steps with a railing? : A Little 6 Click Score: 22    End of Session Equipment Utilized During Treatment: Oxygen Activity Tolerance: Patient tolerated treatment well Patient left: in chair;with nursing/sitter in room(at sink for bath, NT in room) Nurse Communication: Mobility status PT Visit Diagnosis: Other abnormalities of gait and mobility (R26.89)     Time: 1030-1041 PT Time Calculation (min) (ACUTE ONLY): 11 min  Charges:  $Gait Training: 8-22 mins                     Lorrin Goodell, PT  Office #  [phone] Pager [phone]    Lorriane Shire 02/22/2020, 11:37 AM

## 2020-02-22 NOTE — Progress Notes (Signed)
Hypoglycemic Event  CBG: 61  Treatment: 1 orange juice, 1 pack of graham crackers, and peanut butter  Symptoms: none  Follow-up CBG: Time: 9122 CBG Result: 93  Possible Reasons for Event: insulin adjustment  Comments/MD notified:    Candida Vetter J

## 2020-02-22 NOTE — Plan of Care (Signed)
  Problem: Education: Goal: Knowledge of General Education information will improve Description: Including pain rating scale, medication(s)/side effects and non-pharmacologic comfort measures Outcome: Progressing  Reviewed changes to insulin regimen, as well as strategies to keep blood sugars steady, including eating regular meals and snacks and adjusting long and short acting insulins.  Problem: Nutrition: Goal: Adequate nutrition will be maintained Outcome: Progressing  Assisted patient to order breakfast.

## 2020-02-22 NOTE — TOC Initial Note (Signed)
Transition of Care [facility]) - Initial/Assessment Note    Patient Details  Name: Kyle [name] MRN: [id number] Date of Birth: 11-02-1950  Transition of Care [facility]) CM/SW Contact:    Zenon Mayo, RN Phone Number: 02/22/2020, 9:52 AM  Clinical Narrative:                 Patient from home, indep, states he has transportation  And he has no issues with getting his medications.  TOC team will continue to follow for dc needs.  Expected Discharge Plan: Home/Self Care Barriers to Discharge: Continued Medical Work up   Patient Goals and CMS Choice Patient states their goals for this hospitalization and ongoing recovery are:: get well   Choice offered to / list presented to : NA  Expected Discharge Plan and Services Expected Discharge Plan: Home/Self Care In-house Referral: NA Discharge Planning Services: CM Consult Post Acute Care Choice: NA Living arrangements for the past 2 months: Single Family Home                   DME Agency: NA       HH Arranged: NA          Prior Living Arrangements/Services Living arrangements for the past 2 months: Single Family Home Lives with:: Spouse Patient language and need for interpreter reviewed:: Yes Do you feel safe going back to the place where you live?: Yes      Need for Family Participation in Patient Care: Yes (Comment) Care giver support system in place?: Yes (comment)   Criminal Activity/Legal Involvement Pertinent to Current Situation/Hospitalization: No - Comment as needed  Activities of Daily Living Home Assistive Devices/Equipment: CBG Meter ADL Screening (condition at time of admission) Patient's cognitive ability adequate to safely complete daily activities?: Yes Is the patient deaf or have difficulty hearing?: No Does the patient have difficulty seeing, even when wearing glasses/contacts?: No Does the patient have difficulty concentrating, remembering, or making decisions?: No Patient able to express need for  assistance with ADLs?: Yes Does the patient have difficulty dressing or bathing?: No Independently performs ADLs?: Yes (appropriate for developmental age) Does the patient have difficulty walking or climbing stairs?: Yes Weakness of Legs: Both Weakness of Arms/Hands: None  Permission Sought/Granted                  Emotional Assessment Appearance:: Appears stated age Attitude/Demeanor/Rapport: Engaged Affect (typically observed): Appropriate Orientation: : Oriented to Place, Oriented to  Time, Oriented to Situation, Oriented to Self Alcohol / Substance Use: Not Applicable Psych Involvement: No (comment)  Admission diagnosis:  Hyperglycemia [R73.9] Hyperosmolar syndrome [E87.0] Nausea and vomiting, intractability of vomiting not specified, unspecified vomiting type [R11.2] Hyperosmolar hyperglycemic state (HHS) ([place]) [E11.00, E11.65] Patient Active Problem List   Diagnosis Date Noted  . Hyperosmolar syndrome 02/20/2020  . 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[place]) 02/20/2020  . Hyperosmolar hyperglycemic state (HHS) ([place]) 02/19/2020  . CKD (chronic kidney disease) stage 4, GFR 15-29 ml/min (HCC) 02/19/2020  . Abnormal findings on diagnostic imaging of lung 01/29/2020  . Anticoagulant long-term use 01/29/2020  . Hypoxia   . Heart failure ([place]) 01/20/2020  . SOB (shortness of breath)   . Acute metabolic encephalopathy   . Lactic acidosis 12/24/2019  . Uremia 12/24/2019  . High anion gap metabolic acidosis 02/63/7858  . Atrial fibrillation, chronic ([place]) 12/24/2019  . Diabetes mellitus type 2, insulin dependent ([place]) 12/24/2019  . Hyperkalemia, diminished renal excretion 12/24/2019  . Healthcare maintenance 07/14/2019  .  Medication management 07/14/2019  . PAD (peripheral artery disease) ([place]) 04/22/2019  . Atherosclerosis of artery of extremity with rest pain ([place]) 04/21/2019  . COPD with chronic bronchitis and emphysema ([place]) 12/16/2018  . Chronic respiratory failure with  hypoxia ([place]) 12/16/2018  . Acute renal failure ([place])   . Protein-calorie malnutrition, severe ([place]) 12/18/2014  . Aortoiliac occlusive disease ([place]) 12/16/2014  . PVD (peripheral vascular disease) ([place]) 10/24/2014  . Aortic stenosis 09/12/2014  . 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 Pulmonary hypertension ([place]) 09/12/2014  . Dyslipidemia 09/12/2014  . Atherosclerosis of native arteries of extremity with intermittent claudication ([place]) 05/11/2012  . Aortic insufficiency   . Hypertension   . Peripheral arterial disease ([place])   . Dilated cardiomyopathy ([place])   . Chronic systolic CHF (congestive heart failure) ([place])    PCP:  Alonna Buckler, MD Pharmacy:   [place], [place] - [address] [place] [address] Phone: [phone] Fax: [facility] [place], [place] [address] Phone: [phone] Fax: [phone]  [facility] [place] [employer] [address] [place] [address] Phone: [phone] Fax: [phone]     Social Determinants of Health ([place]) Interventions    Readmission Risk Interventions Readmission Risk Prevention Plan 02/22/2020 01/24/2020 04/26/2019  Transportation Screening Complete Complete Complete  PCP or Specialist Appt within 5-7 Days - - Complete  Home Care Screening - - Complete  Medication Review (RN CM) - - Complete  Medication Review ([place]) Complete Complete -  PCP or Specialist appointment within 3-5 days of discharge Complete Complete -  [place] or Home Care Consult Complete Complete -  SW Recovery Care/Counseling Consult Complete Complete -  Palliative Care Screening Not Applicable Not Applicable -  [place] Not Applicable Not Applicable -  Some recent data might be hidden

## 2020-02-22 NOTE — Progress Notes (Signed)
PROGRESS NOTE    Kyle [name]  FAO:[id number] DOB: 1951/10/21 DOA: 02/19/2020 PCP: Alonna Buckler, MD     Brief Narrative:  69 y.o. BM PMHx  DM Type 2 uncontrolled with complication, on insulin, AI, COPD on 4L at home, HTN, HLD, PAD s/p RIGHT fem-pop BPG.  Pt with extensive recent hospital history: Admitted end of feb-mid march with AKF, hyperkalemia, severe lactic acidosis unclear etiology.  Required IP dialysis during that admit, ultimately able to be weaned off of dialysis and discharged.  Readmitted at end of march for CHF exacerbation due to lasix having been discontinued during prior hospital stay (due to severe AKF).  Diuresed with IV lasix, transitioned to PO.  Looks like new baseline creat was about 2.5  As outpt, had insulin decreased recently per patient.  Developed N/V this morning, has had some diarrhea since last week as well.  Generalized weakness.  No fever, no CP, SOB, headache, cough, etc.   ED Course: AG 17, no keytones in urine.  But BGL is 1357!  Creat 2.6 with BUN 60.  Sodium (uncorrected) 127.  2L NS bolus in ED ordered, and hospitalist asked to admit.  Review of Systems: As per HPI, otherwise all review of systems negative.   Subjective: 4/29 A/O x4, chronic S OB, negative CP, negative abdominal pain.   Assessment & Plan:   Principal Problem:   Hyperosmolar hyperglycemic state (HHS) ([place]) Active Problems:   Hypertension   Peripheral arterial disease (HCC)   Chronic systolic CHF (congestive heart failure) (HCC)   Aortic stenosis   Protein-calorie malnutrition, severe (HCC)   COPD with chronic bronchitis and emphysema (HCC)   Chronic respiratory failure with hypoxia (HCC)   Atrial fibrillation, chronic ([place])   Diabetes mellitus type 2, insulin dependent ([place])   Hyperosmolar syndrome   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[place])  HHNS -Patient seen by diabetic coordinator -4/28 diabetic nutrition consult -Off insulin drip -If  patient CBG remains<200 overnight will discharge on 4/29 -4/28 increase Levemir 30 units daily -4/28 NovoLog 5 units qac -4/28 DC moderate SSI; CBG q 4hr  Chronic systolic CHF -Dr. Peter Martinique is patient's cardiologist appears last seen 2/4 at that visit was instructed to follow-up in 6 months. -However significant changes in patient's echocardiogram.  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  Contacted Trish and discussed case as to if they wanted [place] to begin work-up in the hospital or did they want to see patient and work-up as outpatient.  I was informed they would see patient when he was discharged and they would work-up changes as outpatient. -Prior to discharge schedule appointment with Dr. Peter Martinique cardiology work-up for infiltrative cardiomyopathy.   -Strict in and out +2.0 L -Daily weight Filed Weights   02/20/20 1518 02/21/20 0431 02/22/20 0102  Weight: 65.3 kg 65.4 kg 66.2 kg  -Amlodipine 5 mg daily -Coreg 6.25 mgBID  Essential HTN -See CHF  Paroxysmal atrial fibrillation -See CHF -Warfarin per pharmacy Recent Labs  Lab 02/19/20 2240 02/20/20 1319 02/21/20 1020 02/22/20 0406  INR 1.6* 1.6* 1.7* 1.7*  -4/28 pharmacy administered additional dose Coumadin. -4/29 discussed case with pharmacist will administer 6 mg Coumadin today, patient to be discharged on Coumadin 2.5 mg daily.  Expect that INR will be therapeutic in A.m.  COPD/Chronic Respiratory failure with hypoxia -Patient now back on home dose of O2, 4 L via [place]. -Ellipta 62.5-25 mcg/INH daily -Schedule follow-up appointment in 1 to 2 weeks with NP Aaron Edelman  [place], COPD, lung cancer screening.  CKD stage IV (baseline Cr=2.4) Recent Labs  Lab 02/20/20 0452 02/20/20 1232 02/20/20 1522 02/21/20 1158 02/22/20 0406  CREATININE 2.04* 2.02* 2.02* 1.82* 1.74*  -Patient's creatinine currently better than baseline.    DVT prophylaxis: Coumadin Code Status: Full Family  Communication:  Disposition Plan:  1.  Where the patient is from 2.  Anticipated d/c place. 3.  Barriers to d/c DC 4/30 if Coumadin therapeutic    Consultants:    Procedures/Significant Events:  2/17 echocardiogram;Left Ventricle: Impaired GLS: -14.6%. LVEF= 65 to 70%.  -Severe LVH   -Grade I diastolic dysfunction (impaired relaxation).  Right Ventricle: moderately elevated PASP; estimated right ventricular systolic pressure is 99.8 mmHg.  Aortic Valve: The aortic valve is normal in structure and function.. There  is severe thickening and severe calcifcation of the aortic valve. Aortic  valve regurgitation is moderate. Aortic regurgitation PHT measures 714  msec. Mild to moderate aortic  stenosis is present. There is severe thickening of the aortic valve. There  is severe calcifcation of the aortic valve. Aortic valve mean gradient  measures 13.3 mmHg. Aortic valve peak gradient measures 22.9 mmHg. Aortic  valve area, by VTI measures 1.92  cm.   Pulmonic Valve: The pulmonic valve was normal in structure. Pulmonic valve  regurgitation is not visualized. No evidence of pulmonic stenosis.   Aorta: The aortic root is normal in size and structure.   Venous: The inferior vena cava is normal in size with greater than 50%  respiratory variability, suggesting right atrial pressure of 3 mmHg.   IAS/Shunts: No atrial level shunt detected by color flow Doppler.   Additional Comments: Severe concentric LVH, consider evaluation for  infiltrative cardiomyopathies such as cardiac amyloidosis.  --------------------------------------------------------------------------------------------------------------------------   I have personally reviewed and interpreted all radiology studies and my findings are as above.  VENTILATOR SETTINGS:    Cultures   Antimicrobials:    Devices    LINES / TUBES:      Continuous Infusions:   Objective: Vitals:   02/22/20 0102 02/22/20 0423  02/22/20 1000 02/22/20 1202  BP:  139/71 124/68 123/72  Pulse:  76 78 79  Resp:  20 16 16   Temp:  98.1 F (36.7 C) 98.3 F (36.8 C) 98.3 F (36.8 C)  TempSrc:  Oral Oral Oral  SpO2:  96% 97% 97%  Weight: 66.2 kg     Height:        Intake/Output Summary (Last 24 hours) at 02/22/2020 1522 Last data filed at 02/22/2020 0900 Gross per 24 hour  Intake 720 ml  Output 225 ml  Net 495 ml   Filed Weights   02/20/20 1518 02/21/20 0431 02/22/20 0102  Weight: 65.3 kg 65.4 kg 66.2 kg    Examination:  General: A/O x4, positive chronic respiratory distress, cachectic Eyes: negative scleral hemorrhage, negative anisocoria, negative icterus ENT: Negative Runny nose, negative gingival bleeding, Neck:  Negative scars, masses, torticollis, lymphadenopathy, JVD Lungs: Clear to auscultation bilaterally without wheezes or crackles Cardiovascular: Regular rate and rhythm without murmur gallop or rub normal S1 and S2 Abdomen: negative abdominal pain, nondistended, positive soft, bowel sounds, no rebound, no ascites, no appreciable mass Extremities: No significant cyanosis, clubbing, or edema bilateral lower extremities Skin: Negative rashes, lesions, ulcers Psychiatric:  Negative depression, negative anxiety, negative fatigue, negative mania  Central nervous system:  Cranial nerves II through XII intact, tongue/uvula midline, all extremities muscle strength 5/5, sensation intact throughout, negative dysarthria, negative expressive aphasia, negative receptive aphasia.  Marland Kitchen  Data Reviewed: Care during the described time interval was provided by me .  I have reviewed this patient's available data, including medical history, events of note, physical examination, and all test results as part of my evaluation.  CBC: Recent Labs  Lab 02/19/20 1728 02/22/20 0406  WBC 11.3* 9.5  HGB 12.9* 11.6*  HCT 44.2 36.1*  MCV 97.8 89.8  PLT 192 381*   Basic Metabolic Panel: Recent Labs  Lab  02/20/20 0452 02/20/20 1232 02/20/20 1522 02/21/20 1158 02/22/20 0406  NA 146* 143 139 138 140  K 3.0* 5.2* 5.0 4.5 4.6  CL 107 101 97* 98 103  CO2 28 26 27 30 28   GLUCOSE 278* 237* 346* 146* 67*  BUN 48* 54* 50* 44* 39*  CREATININE 2.04* 2.02* 2.02* 1.82* 1.74*  CALCIUM 9.4 10.8* 10.6* 9.9 9.7  MG  --   --   --   --  2.3  PHOS  --   --   --   --  3.5   GFR: Estimated Creatinine Clearance: 38.6 mL/min (A) (by C-G formula based on SCr of 1.74 mg/dL (H)). Liver Function Tests: Recent Labs  Lab 02/19/20 1728  AST 16  ALT 25  ALKPHOS 149*  BILITOT 0.9  PROT 8.3*  ALBUMIN 4.2   Recent Labs  Lab 02/19/20 1728  LIPASE 48   No results for input(s): AMMONIA in the last 168 hours. Coagulation Profile: Recent Labs  Lab 02/19/20 2240 02/20/20 1319 02/21/20 1020 02/22/20 0406  INR 1.6* 1.6* 1.7* 1.7*   Cardiac Enzymes: No results for input(s): CKTOTAL, CKMB, CKMBINDEX, TROPONINI in the last 168 hours. BNP (last 3 results) No results for input(s): PROBNP in the last 8760 hours. HbA1C: No results for input(s): HGBA1C in the last 72 hours. CBG: Recent Labs  Lab 02/22/20 0012 02/22/20 0412 02/22/20 0440 02/22/20 0800 02/22/20 1122  GLUCAP 82 61* 93 142* 178*   Lipid Profile: No results for input(s): CHOL, HDL, LDLCALC, TRIG, CHOLHDL, LDLDIRECT in the last 72 hours. Thyroid Function Tests: No results for input(s): TSH, T4TOTAL, FREET4, T3FREE, THYROIDAB in the last 72 hours. Anemia Panel: No results for input(s): VITAMINB12, FOLATE, FERRITIN, TIBC, IRON, RETICCTPCT in the last 72 hours. Sepsis Labs: No results for input(s): PROCALCITON, LATICACIDVEN in the last 168 hours.  Recent Results (from the past 240 hour(s))  Respiratory Panel by RT PCR (Flu A&B, Covid) - Nasopharyngeal Swab     Status: None   Collection Time: 02/19/20  9:34 PM   Specimen: Nasopharyngeal Swab  Result Value Ref Range Status   SARS Coronavirus 2 by RT PCR NEGATIVE NEGATIVE Final     Comment: (NOTE) SARS-CoV-2 target nucleic acids are NOT DETECTED. The SARS-CoV-2 RNA is generally detectable in upper respiratoy specimens during the acute phase of infection. The lowest concentration of SARS-CoV-2 viral copies this assay can detect is 131 copies/mL. A negative result does not preclude SARS-Cov-2 infection and should not be used as the sole basis for treatment or other patient management decisions. A negative result may occur with  improper specimen collection/handling, submission of specimen other than nasopharyngeal swab, presence of viral mutation(s) within the areas targeted by this assay, and inadequate number of viral copies (<131 copies/mL). A negative result must be combined with clinical observations, patient history, and epidemiological information. The expected result is Negative. Fact Sheet for Patients:  PinkCheek.be Fact Sheet for Healthcare Providers:  GravelBags.it This test is not yet ap proved or cleared by the [place] and  has been authorized for detection and/or diagnosis of SARS-CoV-2 by FDA under an Emergency Use Authorization (EUA). This EUA will remain  in effect (meaning this test can be used) for the duration of the COVID-19 declaration under Section 564(b)(1) of the Act, 21 U.S.C. section 360bbb-3(b)(1), unless the authorization is terminated or revoked sooner.    Influenza A by PCR NEGATIVE NEGATIVE Final   Influenza B by PCR NEGATIVE NEGATIVE Final    Comment: (NOTE) The Xpert Xpress SARS-CoV-2/FLU/RSV assay is intended as an aid in  the diagnosis of influenza from Nasopharyngeal swab specimens and  should not be used as a sole basis for treatment. Nasal washings and  aspirates are unacceptable for Xpert Xpress SARS-CoV-2/FLU/RSV  testing. Fact Sheet for Patients: PinkCheek.be Fact Sheet for Healthcare  Providers: GravelBags.it This test is not yet approved or cleared by the [place] FDA and  has been authorized for detection and/or diagnosis of SARS-CoV-2 by  FDA under an Emergency Use Authorization (EUA). This EUA will remain  in effect (meaning this test can be used) for the duration of the  Covid-19 declaration under Section 564(b)(1) of the Act, 21  U.S.C. section 360bbb-3(b)(1), unless the authorization is  terminated or revoked. Performed at [facility], [place] [address], [place], [place] [address]          Radiology Studies: No results found.      Scheduled Meds: . amLODipine  5 mg Oral Daily  . carvedilol  6.25 mg Oral BID WC  . insulin aspart  3 Units Subcutaneous TID WC  . insulin detemir  25 Units Subcutaneous Daily  . levETIRAcetam  500 mg Oral BID  . multivitamin with minerals  1 tablet Oral Daily  . rosuvastatin  10 mg Oral Daily  . umeclidinium-vilanterol  1 puff Inhalation Daily  . warfarin  6 mg Oral ONCE-1600  . Warfarin - Pharmacist Dosing Inpatient   Does not apply q1600   Continuous Infusions:   LOS: 3 days    Time spent:40 min    Geanie Pacifico, Geraldo Docker, MD [facility] Pager [phone]  If 7PM-7AM, please contact night-coverage [email or web address] Password [facility] 02/22/2020, 3:22 PM

## 2020-02-22 NOTE — Progress Notes (Signed)
ANTICOAGULATION CONSULT NOTE - Follow Up Consult  Pharmacy Consult for Coumadin Indication: atrial fibrillation  No Known Allergies  Patient Measurements: Height: 5\' 11"  (180.3 cm) Weight: 66.2 kg (146 lb) IBW/kg (Calculated) : 75.3   Vital Signs: Temp: 98.1 F (36.7 C) (04/29 0423) Temp Source: Oral (04/29 0423) BP: 139/71 (04/29 0423) Pulse Rate: 76 (04/29 0423)  Labs: Recent Labs    02/19/20 1728 02/19/20 2240 02/20/20 1232 02/20/20 1319 02/20/20 1522 02/21/20 1020 02/21/20 1158 02/22/20 0406  HGB 12.9*  --   --   --   --   --   --  11.6*  HCT 44.2  --   --   --   --   --   --  36.1*  PLT 192  --   --   --   --   --   --  145*  LABPROT  --    < >  --  18.3*  --  19.2*  --  19.8*  INR  --    < >  --  1.6*  --  1.7*  --  1.7*  CREATININE 2.68*   < >   < >  --  2.02*  --  1.82* 1.74*   < > = values in this interval not displayed.    Estimated Creatinine Clearance: 38.6 mL/min (A) (by C-G formula based on SCr of 1.74 mg/dL (H)).  Assessment:  Anticoag: Warfarin for hx afib - INR 1.7, Hgb 12.9>11.6, plts 192>145 - briefly on apixaban but changed back to warfarin due to renal issues.  - PTA Coumadin 2mg  daily (admit INR 1.6)  Goal of Therapy:  INR 2-3 Monitor platelets by anticoagulation protocol: Yes   Plan:  Warfarin 6mg  PO x 1 today Daily INR Recommend home on 2.5mg  daily   Abraham Margulies S. Alford Highland, PharmD, BCPS Clinical Staff Pharmacist [email or web address] Alford Highland, [place] 02/22/2020,8:29 AM

## 2020-02-23 DIAGNOSIS — E1101 Type 2 diabetes mellitus with hyperosmolarity with coma: Secondary | ICD-10-CM

## 2020-02-23 LAB — BASIC METABOLIC PANEL
Anion gap: 8 (ref 5–15)
BUN: 39 mg/dL — ABNORMAL HIGH (ref 8–23)
CO2: 28 mmol/L (ref 22–32)
Calcium: 9.3 mg/dL (ref 8.9–10.3)
Chloride: 103 mmol/L (ref 98–111)
Creatinine, Ser: 1.74 mg/dL — ABNORMAL HIGH (ref 0.61–1.24)
GFR calc Af Amer: 46 mL/min — ABNORMAL LOW (ref >60)
GFR calc non Af Amer: 40 mL/min — ABNORMAL LOW (ref >60)
Glucose, Bld: 80 mg/dL (ref 70–99)
Potassium: 4.7 mmol/L (ref 3.5–5.1)
Sodium: 139 mmol/L (ref 135–145)

## 2020-02-23 LAB — CBC
HCT: 34.6 % — ABNORMAL LOW (ref 39.0–52.0)
Hemoglobin: 11 g/dL — ABNORMAL LOW (ref 13.0–17.0)
MCH: 28.8 pg (ref 26.0–34.0)
MCHC: 31.8 g/dL (ref 30.0–36.0)
MCV: 90.6 fL (ref 80.0–100.0)
Platelets: 141 10*3/uL — ABNORMAL LOW (ref 150–400)
RBC: 3.82 MIL/uL — ABNORMAL LOW (ref 4.22–5.81)
RDW: 17.2 % — ABNORMAL HIGH (ref 11.5–15.5)
WBC: 7.5 10*3/uL (ref 4.0–10.5)
nRBC: 0 % (ref 0.0–0.2)

## 2020-02-23 LAB — GLUCOSE, CAPILLARY
Glucose-Capillary: 193 mg/dL — ABNORMAL HIGH (ref 70–99)
Glucose-Capillary: 201 mg/dL — ABNORMAL HIGH (ref 70–99)
Glucose-Capillary: 74 mg/dL (ref 70–99)
Glucose-Capillary: 92 mg/dL (ref 70–99)

## 2020-02-23 LAB — PROTIME-INR
INR: 1.9 — ABNORMAL HIGH (ref 0.8–1.2)
Prothrombin Time: 21.2 seconds — ABNORMAL HIGH (ref 11.4–15.2)

## 2020-02-23 LAB — PHOSPHORUS: Phosphorus: 3.9 mg/dL (ref 2.5–4.6)

## 2020-02-23 LAB — MAGNESIUM: Magnesium: 2.3 mg/dL (ref 1.7–2.4)

## 2020-02-23 MED ORDER — WARFARIN SODIUM 3 MG PO TABS: 6.0000 mg | ORAL_TABLET | Freq: Once | ORAL | Status: DC

## 2020-02-23 MED ORDER — INSULIN ASPART 100 UNIT/ML FLEXPEN: 3.0000 [IU] | PEN_INJECTOR | Freq: Three times a day (TID) | SUBCUTANEOUS | 0 refills | Status: DC

## 2020-02-23 MED ORDER — INSULIN DETEMIR 100 UNIT/ML FLEXPEN: 25.0000 [IU] | Freq: Every day | SUBCUTANEOUS | 0 refills | Status: DC

## 2020-02-23 MED ORDER — WARFARIN SODIUM 5 MG PO TABS: 2.5000 mg | ORAL_TABLET | Freq: Every day | ORAL | 0 refills | Status: DC

## 2020-02-23 MED FILL — PENTIPS 32G X 4 MM MISC: 32G X 4 MM | 30 days supply | Qty: 100 | Fill #0

## 2020-02-23 MED FILL — WARFARIN SODIUM 5 MG TABLET: 5 | 30 days supply | Qty: 15 | Fill #0

## 2020-02-23 MED FILL — NOVOLOG FLEXPEN SYRINGE: 100 | 30 days supply | Qty: 3 | Fill #0

## 2020-02-23 MED FILL — LEVEMIR FLEXTOUCH 100 UNITS: 100 | 30 days supply | Qty: 9 | Fill #0

## 2020-02-23 NOTE — Progress Notes (Signed)
ANTICOAGULATION CONSULT NOTE - Follow Up Consult  Pharmacy Consult for Coumadin Indication: atrial fibrillation  No Known Allergies  Patient Measurements: Height: 5\' 11"  (180.3 cm) Weight: 67 kg (147 lb 12.8 oz) IBW/kg (Calculated) : 75.3   Vital Signs: Temp: 97.6 F (36.4 C) (04/30 0346) BP: 139/67 (04/30 0346) Pulse Rate: 77 (04/30 0346)  Labs: Recent Labs    02/20/20 1522 02/21/20 1020 02/21/20 1158 02/22/20 0406 02/23/20 0421  HGB  --   --   --  11.6* 11.0*  HCT  --   --   --  36.1* 34.6*  PLT  --   --   --  145* 141*  LABPROT  --  19.2*  --  19.8* 21.2*  INR  --  1.7*  --  1.7* 1.9*  CREATININE   < >  --  1.82* 1.74* 1.74*   < > = values in this interval not displayed.    Estimated Creatinine Clearance: 39 mL/min (A) (by C-G formula based on SCr of 1.74 mg/dL (H)).  Assessment:  Anticoag: Warfarin for hx afib - INR 1.9, Hgb 12.9>11, plts 192>141 - briefly on apixaban but changed back to warfarin due to renal issues.  - PTA Coumadin 2mg  daily (admit INR 1.6)  Goal of Therapy:  INR 2-3 Monitor platelets by anticoagulation protocol: Yes   Plan:  Warfarin 6mg  PO x 1 again today Daily INR Recommend home on 2.5mg  daily (low INR on admission)   Vena Bassinger S. Alford Highland, PharmD, BCPS Clinical Staff Pharmacist [email or web address] Alford Highland, Velna Hedgecock [place] 02/23/2020,9:07 AM

## 2020-02-23 NOTE — Discharge Summary (Signed)
Physician Discharge Summary  Kyle [name] RCV:[id number] DOB: [birth year] DOA: 02/19/2020  PCP: Kyle Buckler, MD  Admit date: 02/19/2020 Discharge date: 02/23/2020  Time spent: 30 minutes  Recommendations for Outpatient Follow-up:   [facility] -Patient seen by diabetic coordinator -4/28 diabetic nutrition consult -4/28 increase Levemir 30 units daily -4/28 NovoLog 5 units qac  Chronic systolic CHF -Dr. Peter [name] is patient's cardiologist appears last seen 2/4 at that visit was instructed to follow-up in 6 months. -However significant changes in patient's echocardiogram.  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  Contacted Kyle [name] and discussed case as to if they wanted [place] to begin work-up in the hospital or did they want to see patient and work-up as outpatient.  I was informed they would see patient when he was discharged and they would work-up changes as outpatient. -Schedule appointment with Dr. Peter [name] cardiology in 2 weeks work-up for infiltrative cardiomyopathy.   -Strict in and out +2.0 L -Daily weight Filed Weights   02/21/20 0431 02/22/20 0102 02/23/20 0346  Weight: 65.4 kg 66.2 kg 67 kg  -Amlodipine 5 mg daily -Coreg 6.25 mgBID  Essential HTN -See CHF  Paroxysmal atrial fibrillation -See CHF -Warfarin per pharmacy Recent Labs  Lab 02/19/20 2240 02/20/20 1319 02/21/20 1020 02/22/20 0406 02/23/20 0421  INR 1.6* 1.6* 1.7* 1.7* 1.9*  -4/28 pharmacy administered additional dose Coumadin. -4/29 discussed case with pharmacist will administer 6 mg Coumadin today, patient to be discharged on Coumadin 2.5 mg daily.    COPD/Chronic Respiratory failure with hypoxia -Patient now back on home dose of O2, 4 L via [place]. -Ellipta 62.5-25 mcg/INH daily -Schedule follow-up appointment in 1 to 2 weeks with NP Kyle [name], COPD, lung cancer screening.  CKD stage IV (baseline Cr=2.4) Recent Labs  Lab 02/20/20 1232 02/20/20 1522  02/21/20 1158 02/22/20 0406 02/23/20 0421  CREATININE 2.02* 2.02* 1.82* 1.74* 1.74*  -Better than baseline    Discharge Diagnoses:  Principal Problem:   Hyperosmolar hyperglycemic state (HHS) ([place]) Active Problems:   Hypertension   Peripheral arterial disease (HCC)   Chronic systolic CHF (congestive heart failure) (HCC)   Aortic stenosis   Protein-calorie malnutrition, severe (HCC)   COPD with chronic bronchitis and emphysema (HCC)   Chronic respiratory failure with hypoxia (HCC)   Atrial fibrillation, chronic (HCC)   Diabetes mellitus type 2, insulin dependent ([place])   Hyperosmolar syndrome   CKD stage 3 secondary to diabetes [facility])   Discharge Condition: Stable  Diet recommendation: Heart healthy/carb modified  Filed Weights   02/21/20 0431 02/22/20 0102 02/23/20 0346  Weight: 65.4 kg 66.2 kg 67 kg    History of present illness:  69 y.o.BM PMHx DM Type 2 uncontrolled with complication, on insulin, AI, COPD on 4L at home, HTN, HLD, PAD s/p RIGHT fem-pop BPG.  Pt with extensive recent hospital history: Admitted end of feb-mid march with AKF, hyperkalemia, severe lactic acidosis unclear etiology. Required IP dialysis during that admit, ultimately able to be weaned off of dialysis and discharged.  Readmitted at end of march for CHF exacerbation due to lasix having been discontinued during prior hospital stay (due to severe AKF). Diuresed with IV lasix, transitioned to PO. Looks like new baseline creat was about 2.5  As outpt, had insulin decreased recently per patient.  Developed N/V this morning, has had some diarrhea since last week as well. Generalized weakness. No fever, no CP, SOB, headache, cough, etc.   ED Course:AG 17, no keytones in urine. But BGL  is 1357!  Creat 2.6 with BUN 60. Sodium (uncorrected) 127.  2L NS bolus in ED ordered, and hospitalist asked to admit.  Hospital Course:  See above  Procedures: 2/17 echocardiogram;Left  Ventricle: Impaired GLS: -14.6%. LVEF= 65 to 70%.  -Severe LVH   -Grade I diastolic dysfunction (impaired relaxation).  Right Ventricle: moderately elevated PASP; estimated right ventricular systolic pressure is 27.0 mmHg.  Aortic Valve: regurgitation is moderate. --Mild to moderate aortic Stenosis -Additional Comments: Severe concentric LVH, consider evaluation for  infiltrative cardiomyopathies such as cardiac amyloidosis.      Discharge Exam: Vitals:   02/22/20 1202 02/22/20 2051 02/23/20 0346 02/23/20 0733  BP: 123/72 124/70 139/67   Pulse: 79 85 77   Resp: 16 19 19    Temp: 98.3 F (36.8 C) 98.4 F (36.9 C) 97.6 F (36.4 C)   TempSrc: Oral Oral    SpO2: 97% 91% 100% 99%  Weight:   67 kg   Height:        General: A/O x4, positive chronic respiratory distress, cachectic Eyes: negative scleral hemorrhage, negative anisocoria, negative icterus ENT: Negative Runny nose, negative gingival bleeding, Neck:  Negative scars, masses, torticollis, lymphadenopathy, JVD Lungs: Clear to auscultation bilaterally without wheezes or crackles Cardiovascular: Regular rate and rhythm without murmur gallop or rub normal S1 and S2   Discharge Instructions   Allergies as of 02/23/2020   No Known Allergies     Medication List    STOP taking these medications   furosemide 20 MG tablet Commonly known as: LASIX   furosemide 80 MG tablet Commonly known as: LASIX   insulin glargine 100 UNIT/ML injection Commonly known as: LANTUS     TAKE these medications   Accu-Chek FastClix Lancets Misc What changed: Another medication with the same name was removed. Continue taking this medication, and follow the directions you see here.   Accu-Chek Guide test strip Generic drug: glucose blood   Accu-Chek Guide test strip Generic drug: glucose blood one strip (1 each dose) by Other route 2 (two) times daily. Test blood glucose 2 times a day.   amLODipine 5 MG tablet Commonly known as:  NORVASC TAKE 1 TABLET EVERY DAY   Anoro Ellipta 62.5-25 MCG/INH Aepb Generic drug: umeclidinium-vilanterol INHALE 1 PUFF BY MOUTH ONCE DAILY . APPOINTMENT REQUIRED FOR FUTURE REFILLS What changed: See the new instructions.   BD Pen Needle Nano U/F 32G X 4 MM Misc Generic drug: Insulin Pen Needle   BD Pen Needle Nano U/F 32G X 4 MM Misc Generic drug: Insulin Pen Needle USE TO INJECT INTO THE SKIN DAILY.   carvedilol 6.25 MG tablet Commonly known as: COREG Take 1 tablet (6.25 mg total) by mouth 2 (two) times daily with a meal.   guaiFENesin 600 MG 12 hr tablet Commonly known as: MUCINEX Take 2 tablets (1,200 mg total) by mouth 2 (two) times daily. What changed: how much to take   HYDROcodone-acetaminophen 5-325 MG tablet Commonly known as: NORCO/VICODIN Take 1 tablet by mouth daily as needed for severe pain.   insulin aspart 100 UNIT/ML FlexPen Commonly known as: NOVOLOG Inject 3 Units into the skin 3 (three) times daily with meals.   insulin detemir 100 unit/ml Soln Commonly known as: LEVEMIR Inject 0.25 mLs (25 Units total) into the skin daily.   levETIRAcetam 500 MG tablet Commonly known as: KEPPRA Take 1 tablet (500 mg total) by mouth 2 (two) times daily.   multivitamin with minerals Tabs tablet Take 1 tablet by mouth daily.  OXYGEN Inhale 4.5 L into the lungs continuous.   rosuvastatin 10 MG tablet Commonly known as: CRESTOR Take 1 tablet (10 mg total) by mouth daily at 6 PM. What changed: when to take this   warfarin 5 MG tablet Commonly known as: Coumadin Take 0.5 tablets (2.5 mg total) by mouth daily. What changed:   medication strength  how much to take  when to take this      No Known Allergies Follow-up Information    Kyle Buckler, MD. Go on 03/01/2020.   Specialty: Family Medicine Why: @9 :15am Contact information: [place] [address] [phone]        [name], Kyle M, MD. Schedule an appointment as  soon as possible for a visit in 2 week(s).   Specialty: Cardiology Why: Schedule appointment with Dr. Peter [name] cardiology in 2 weeks work-up for infiltrative cardiomyopathy. Contact information: [place] [address] [phone]            The results of significant diagnostics from this hospitalization (including imaging, microbiology, ancillary and laboratory) are listed below for reference.    Significant Diagnostic Studies: No results found.  Microbiology: Recent Results (from the past 240 hour(s))  Respiratory Panel by RT PCR (Flu A&B, Covid) - Nasopharyngeal Swab     Status: None   Collection Time: 02/19/20  9:34 PM   Specimen: Nasopharyngeal Swab  Result Value Ref Range Status   SARS Coronavirus 2 by RT PCR NEGATIVE NEGATIVE Final    Comment: (NOTE) SARS-CoV-2 target nucleic acids are NOT DETECTED. The SARS-CoV-2 RNA is generally detectable in upper respiratoy specimens during the acute phase of infection. The lowest concentration of SARS-CoV-2 viral copies this assay can detect is 131 copies/mL. A negative result does not preclude SARS-Cov-2 infection and should not be used as the sole basis for treatment or other patient management decisions. A negative result may occur with  improper specimen collection/handling, submission of specimen other than nasopharyngeal swab, presence of viral mutation(s) within the areas targeted by this assay, and inadequate number of viral copies (<131 copies/mL). A negative result must be combined with clinical observations, patient history, and epidemiological information. The expected result is Negative. Fact Sheet for Patients:  PinkCheek.be Fact Sheet for Healthcare Providers:  GravelBags.it This test is not yet ap proved or cleared by the [place] FDA and  has been authorized for detection and/or diagnosis of SARS-CoV-2 by FDA under an  Emergency Use Authorization (EUA). This EUA will remain  in effect (meaning this test can be used) for the duration of the COVID-19 declaration under Section 564(b)(1) of the Act, 21 U.S.C. section 360bbb-3(b)(1), unless the authorization is terminated or revoked sooner.    Influenza A by PCR NEGATIVE NEGATIVE Final   Influenza B by PCR NEGATIVE NEGATIVE Final    Comment: (NOTE) The Xpert Xpress SARS-CoV-2/FLU/RSV assay is intended as an aid in  the diagnosis of influenza from Nasopharyngeal swab specimens and  should not be used as a sole basis for treatment. Nasal washings and  aspirates are unacceptable for Xpert Xpress SARS-CoV-2/FLU/RSV  testing. Fact Sheet for Patients: PinkCheek.be Fact Sheet for Healthcare Providers: GravelBags.it This test is not yet approved or cleared by the [place] FDA and  has been authorized for detection and/or diagnosis of SARS-CoV-2 by  FDA under an Emergency Use Authorization (EUA). This EUA will remain  in effect (meaning this test can be used) for the duration of the  Covid-19 declaration under  Section 564(b)(1) of the Act, 21  U.S.C. section 360bbb-3(b)(1), unless the authorization is  terminated or revoked. Performed at [facility], [place] [address], [place], [place] [address]      Labs: Basic Metabolic Panel: Recent Labs  Lab 02/20/20 1232 02/20/20 1522 02/21/20 1158 02/22/20 0406 02/23/20 0421  NA 143 139 138 140 139  K 5.2* 5.0 4.5 4.6 4.7  CL 101 97* 98 103 103  CO2 26 27 30 28 28   GLUCOSE 237* 346* 146* 67* 80  BUN 54* 50* 44* 39* 39*  CREATININE 2.02* 2.02* 1.82* 1.74* 1.74*  CALCIUM 10.8* 10.6* 9.9 9.7 9.3  MG  --   --   --  2.3 2.3  PHOS  --   --   --  3.5 3.9   Liver Function Tests: Recent Labs  Lab 02/19/20 1728  AST 16  ALT 25  ALKPHOS 149*  BILITOT 0.9  PROT 8.3*  ALBUMIN 4.2   Recent Labs  Lab 02/19/20 1728  LIPASE 48   No results  for input(s): AMMONIA in the last 168 hours. CBC: Recent Labs  Lab 02/19/20 1728 02/22/20 0406 02/23/20 0421  WBC 11.3* 9.5 7.5  HGB 12.9* 11.6* 11.0*  HCT 44.2 36.1* 34.6*  MCV 97.8 89.8 90.6  PLT 192 145* 141*   Cardiac Enzymes: No results for input(s): CKTOTAL, CKMB, CKMBINDEX, TROPONINI in the last 168 hours. BNP: BNP (last 3 results) Recent Labs    01/20/20 1439  BNP 138.7*    ProBNP (last 3 results) No results for input(s): PROBNP in the last 8760 hours.  CBG: Recent Labs  Lab 02/22/20 2051 02/23/20 0034 02/23/20 0350 02/23/20 0905 02/23/20 1059  GLUCAP 95 74 92 201* 193*       Signed:  Dia Crawford, MD [facility] [phone] pager

## 2020-02-23 NOTE — Progress Notes (Signed)
Discharge and medication education give with teach back. Nutrition education given. Questions answered. Peripheral iv removed and dressing placed. Pt's belongings with pt.

## 2020-03-04 ENCOUNTER — Ambulatory Visit (INDEPENDENT_AMBULATORY_CARE_PROVIDER_SITE_OTHER): Payer: Medicare HMO | Admitting: Acute Care

## 2020-03-04 ENCOUNTER — Other Ambulatory Visit: Payer: Self-pay

## 2020-03-04 ENCOUNTER — Ambulatory Visit (INDEPENDENT_AMBULATORY_CARE_PROVIDER_SITE_OTHER)
Admission: RE | Admit: 2020-03-04 | Discharge: 2020-03-04 | Disposition: A | Discharge status: Home / Self Care | Payer: Medicare HMO | Source: Ambulatory Visit | Attending: Acute Care | Admitting: Acute Care

## 2020-03-04 ENCOUNTER — Encounter: Payer: Self-pay | Admitting: Acute Care

## 2020-03-04 VITALS — BP 136/64 | Temp 97.3°F | Ht 71.0 in | Wt 154.4 lb

## 2020-03-04 DIAGNOSIS — Z87891 Personal history of nicotine dependence: Secondary | ICD-10-CM

## 2020-03-04 DIAGNOSIS — Z122 Encounter for screening for malignant neoplasm of respiratory organs: Secondary | ICD-10-CM

## 2020-03-04 DIAGNOSIS — F17210 Nicotine dependence, cigarettes, uncomplicated: Secondary | ICD-10-CM | POA: Diagnosis not present

## 2020-03-04 NOTE — Progress Notes (Signed)
Shared Decision Making Visit Lung Cancer Screening Program [phone])   Eligibility:  Age 69 y.o.  Pack Years Smoking History Calculation 70 pack year smoking history (# packs/per year x # years smoked)  Recent History of coughing up blood  no  Unexplained weight loss? no ( >Than 15 pounds within the last 6 months )  Prior History Lung / other cancer no (Diagnosis within the last 5 years already requiring surveillance chest CT Scans).  Smoking Status Former   Former Smokers: Years since [employer] Date:10/23/2018  Visit Components:  Discussion included one or more decision making aids. yes  Discussion included risk/benefits of screening. yes  Discussion included potential follow up diagnostic testing for abnormal scans. yes  Discussion included meaning and risk of over diagnosis. yes  Discussion included meaning and risk of False Positives. yes  Discussion included meaning of total radiation exposure. yes  Counseling Included:  Importance of adherence to annual lung cancer LDCT screening. yes  Impact of comorbidities on ability to participate in the program. yes  Ability and willingness to under diagnostic treatment. yes  Smoking Cessation Counseling:  Current Smokers:   Discussed importance of smoking cessation. yes  Information about tobacco cessation classes and interventions provided to patient. yes  Patient provided with "ticket" for LDCT Scan. yes  Symptomatic Patient. no  Counseling NA  Diagnosis Code: Tobacco Use Z72.0  Asymptomatic Patient yes  Counseling (Intermediate counseling: > three minutes counseling) Z3299  Former Smokers:   Discussed the importance of maintaining cigarette abstinence. yes  Diagnosis Code: Personal History of Nicotine Dependence. M42.683  Information about tobacco cessation classes and interventions provided to patient. Yes  Patient provided with "ticket" for LDCT Scan. yes  Written Order for Lung Cancer  Screening with LDCT placed in Epic. Yes (CT Chest Lung Cancer Screening Low Dose W/O CM) [id number] Z12.2-Screening of respiratory organs Z87.891-Personal history of nicotine dependence   I spent 25 minutes of face to face time with Mr. Carden discussing the risks and benefits of lung cancer screening. We viewed a power point together that explained in detail the above noted topics. We took the time to pause the power point at intervals to allow for questions to be asked and answered to ensure understanding. We discussed that he had taken the single most powerful action possible to decrease his risk of developing lung cancer when he quit smoking. I counseled him to remain smoke free, and to contact me if he ever had the desire to smoke again so that I can provide resources and tools to help support the effort to remain smoke free. We discussed the time and location of the scan, and that either  Doroteo Glassman RN or I will call with the results within  24-48 hours of receiving them. He has my card and contact information in the event he needs to speak with me, in addition to a copy of the power point we reviewed as a resource. He verbalized understanding of all of the above and had no further questions upon leaving the office.     I explained to the patient that there has been a high incidence of coronary artery disease noted on these exams. I explained that this is a non-gated exam therefore degree or severity cannot be determined. This patient is  Currently on statin therapy. I have asked the patient to follow-up with their PCP regarding any incidental finding of coronary artery disease and management with diet or medication as they feel is clinically  indicated. The patient verbalized understanding of the above and had no further questions.      Magdalen Spatz, NP 03/04/2020

## 2020-03-04 NOTE — Patient Instructions (Signed)
Thank you for participating in the [facility] Lung Cancer Screening Program. It was our pleasure to meet you today. We will call you with the results of your scan within the next few days. Your scan will be assigned a Lung RADS category score by the physicians reading the scans.  This Lung RADS score determines follow up scanning.  See below for description of categories, and follow up screening recommendations. We will be in touch to schedule your follow up screening annually or based on recommendations of our providers. We will fax a copy of your scan results to your Primary Care Physician, or the physician who referred you to the program, to ensure they have the results. Please call the office if you have any questions or concerns regarding your scanning experience or results.  Our office number is [phone]. Please speak with Denise Phelps, RN. She is our Lung Cancer Screening RN. If she is unavailable when you call, please have the office staff send her a message. She will return your call at her earliest convenience. Remember, if your scan is normal, we will scan you annually as long as you continue to meet the criteria for the program. (Age 55-77, Current smoker or smoker who has quit within the last 15 years). If you are a smoker, remember, quitting is the single most powerful action that you can take to decrease your risk of lung cancer and other pulmonary, breathing related problems. We know quitting is hard, and we are here to help.  Please let us know if there is anything we can do to help you meet your goal of quitting. If you are a former smoker, congratulations. We are proud of you! Remain smoke free! Remember you can refer friends or family members through the number above.  We will screen them to make sure they meet criteria for the program. Thank you for helping us take better care of you by participating in Lung Screening.  Lung RADS Categories:  Lung RADS 1: no nodules  or definitely non-concerning nodules.  Recommendation is for a repeat annual scan in 12 months.  Lung RADS 2:  nodules that are non-concerning in appearance and behavior with a very low likelihood of becoming an active cancer. Recommendation is for a repeat annual scan in 12 months.  Lung RADS 3: nodules that are probably non-concerning , includes nodules with a low likelihood of becoming an active cancer.  Recommendation is for a 6-month repeat screening scan. Often noted after an upper respiratory illness. We will be in touch to make sure you have no questions, and to schedule your 6-month scan.  Lung RADS 4 A: nodules with concerning findings, recommendation is most often for a follow up scan in 3 months or additional testing based on our provider's assessment of the scan. We will be in touch to make sure you have no questions and to schedule the recommended 3 month follow up scan.  Lung RADS 4 B:  indicates findings that are concerning. We will be in touch with you to schedule additional diagnostic testing based on our provider's  assessment of the scan.   

## 2020-03-05 NOTE — Progress Notes (Signed)
Please call patient and let them  know their  low dose Ct was read as a Lung RADS 2: nodules that are benign in appearance and behavior with a very low likelihood of becoming a clinically active cancer due to size or lack of growth. Recommendation per radiology is for a repeat LDCT in 12 months. .Please let them  know we will order and schedule their  annual screening scan for 02/2021. Please let them  know there was notation of CAD on their  scan.  Please remind the patient  that this is a non-gated exam therefore degree or severity of disease  cannot be determined. Please have them  follow up with their PCP regarding potential risk factor modification, dietary therapy or pharmacologic therapy if clinically indicated. Pt.  is  currently on statin therapy. Please place order for annual  screening scan for  02/2021 and fax results to PCP. Thanks so much. 

## 2020-03-07 ENCOUNTER — Other Ambulatory Visit: Payer: Self-pay | Admitting: *Deleted

## 2020-03-07 DIAGNOSIS — Z87891 Personal history of nicotine dependence: Secondary | ICD-10-CM

## 2020-03-11 ENCOUNTER — Encounter: Payer: Self-pay | Admitting: Pulmonary Disease

## 2020-03-11 ENCOUNTER — Ambulatory Visit: Payer: Medicare HMO | Admitting: Pulmonary Disease

## 2020-03-11 ENCOUNTER — Other Ambulatory Visit: Payer: Self-pay

## 2020-03-11 DIAGNOSIS — J9611 Chronic respiratory failure with hypoxia: Secondary | ICD-10-CM

## 2020-03-11 DIAGNOSIS — J449 Chronic obstructive pulmonary disease, unspecified: Secondary | ICD-10-CM

## 2020-03-11 MED ORDER — ALBUTEROL SULFATE (2.5 MG/3ML) 0.083% IN NEBU
2.5000 mg | INHALATION_SOLUTION | Freq: Four times a day (QID) | RESPIRATORY_TRACT | 3 refills | Status: DC | PRN
Start: 2020-03-11 — End: 2021-04-12

## 2020-03-11 NOTE — Assessment & Plan Note (Signed)
Continue O2 5 L pulse -saturation is 90% on this today but he does not seem to be in any distress, no bronchospasm or evidence of fluid overload

## 2020-03-11 NOTE — Patient Instructions (Signed)
Stay on Anoro. Prescription for nebulizer with albuterol nebs to take as needed for emergency for wheezing

## 2020-03-11 NOTE — Progress Notes (Signed)
   Subjective:    Patient ID: Kyle [name], male    DOB: 1951/05/26, 69 y.o.   MRN: [id number]  HPI  69 year old smoker for follow-up of emphysema and chronic respiratory failure on oxygen   He uses 5 L pulse on his POC and at home uses 4 to 5 L of continuous O2.  Accompanied by his wife who provides additional history. He has been hospitalized 3 times this year, initially 11/2019 for AKI due to increased diuretic, then in 12/2019 for fluid in his lungs once his diuretic was decreased and then in 01/2020 for hyperglycemia since his insulin dose was changed.  He takes Anoro daily and this seems to help with his breathing.  He does not have any type of rescue inhaler but denies cough, wheezing or pedal edema He has taken both Covid vaccines   Significant tests/ events reviewed  11/26/2018-CTA chest-negative for PE, trace pleural effusions, atelectasis in bases right greater than left  11/20/2018-CT head without contrast- sinuses normal  12/16/2018-spirometry- FVC 1.7 (43% predicted), ratio 64, FEV1 1.1 (36% predicted), consistent with severe airway obstruction  11/21/2018-echocardiogram-LV ejection fraction 40 to 45%, diffuse hypokinesis, right ventricle moderately dilated, systolic function was moderately reduced, PA P pressure 53   Review of Systems Patient denies significant dyspnea,cough, hemoptysis,  chest pain, palpitations, pedal edema, orthopnea, paroxysmal nocturnal dyspnea, lightheadedness, nausea, vomiting, abdominal or  leg pains      Objective:   Physical Exam  Gen. Pleasant, tall, thin, in no distress, normal affect ENT - no pallor,icterus, no post nasal drip Neck: No JVD, no thyromegaly, no carotid bruits Lungs: no use of accessory muscles, no dullness to percussion, clear without rales or rhonchi  Cardiovascular: Rhythm regular, heart sounds  normal, no murmurs or gallops, no peripheral edema Abdomen: soft and non-tender, no hepatosplenomegaly, BS  normal. Musculoskeletal: No deformities, no cyanosis or clubbing Neuro:  alert, non focal       Assessment & Plan:

## 2020-03-11 NOTE — Assessment & Plan Note (Signed)
Continue Anoro.  He is resistant to my changing his medications today, ideally should be on triple therapy. He is even resistant to adding rescue albuterol MDI I was able to convince him to obtain a prescription for nebulizer medication to avoid frequent hospitalizations

## 2020-04-12 ENCOUNTER — Ambulatory Visit: Payer: Medicare HMO | Admitting: Pulmonary Disease

## 2020-04-16 ENCOUNTER — Other Ambulatory Visit: Payer: Self-pay | Admitting: Internal Medicine

## 2020-04-16 DIAGNOSIS — Z9119 Patient's noncompliance with other medical treatment and regimen: Secondary | ICD-10-CM | POA: Insufficient documentation

## 2020-04-16 DIAGNOSIS — Z91199 Patient's noncompliance with other medical treatment and regimen due to unspecified reason: Secondary | ICD-10-CM | POA: Insufficient documentation

## 2020-04-18 ENCOUNTER — Other Ambulatory Visit: Payer: Self-pay | Admitting: Internal Medicine

## 2020-04-19 ENCOUNTER — Encounter: Payer: Self-pay | Admitting: Pulmonary Disease

## 2020-04-19 ENCOUNTER — Other Ambulatory Visit: Payer: Self-pay

## 2020-04-19 ENCOUNTER — Ambulatory Visit: Payer: Medicare HMO | Admitting: Pulmonary Disease

## 2020-04-19 VITALS — BP 128/82 | HR 82 | Temp 97.8°F | Ht 71.0 in | Wt 160.4 lb

## 2020-04-19 DIAGNOSIS — Z79899 Other long term (current) drug therapy: Secondary | ICD-10-CM

## 2020-04-19 DIAGNOSIS — J9611 Chronic respiratory failure with hypoxia: Secondary | ICD-10-CM | POA: Diagnosis not present

## 2020-04-19 DIAGNOSIS — J449 Chronic obstructive pulmonary disease, unspecified: Secondary | ICD-10-CM

## 2020-04-19 DIAGNOSIS — N170 Acute kidney failure with tubular necrosis: Secondary | ICD-10-CM | POA: Diagnosis not present

## 2020-04-19 MED ORDER — TRELEGY ELLIPTA 100-62.5-25 MCG/INH IN AEPB
1.0000 | INHALATION_SPRAY | Freq: Every day | RESPIRATORY_TRACT | 0 refills | Status: DC
Start: 2020-04-19 — End: 2020-06-05

## 2020-04-19 NOTE — Patient Instructions (Addendum)
You were seen today by Lauraine Rinne, NP  for:  1. COPD with chronic bronchitis and emphysema (HCC)  Trial of Trelegy Ellipta  >>> 1 puff daily in the morning >>>rinse mouth out after use  >>> This inhaler contains 3 medications that help manage her respiratory status, contact our office if you cannot afford this medication or unable to remain on this medication  Contact our office when you finish the first sample and let us know how you are doing on this.  If you are tolerating this well we will keep you on Trelegy Ellipta.  Note your daily symptoms > remember "red flags" for COPD:   >>>Increase in cough >>>increase in sputum production >>>increase in shortness of breath or activity  intolerance.   If you notice these symptoms, please call the office to be seen.    2. Chronic respiratory failure with hypoxia (HCC)  Continue oxygen therapy as prescribed  >>>maintain oxygen saturations greater than 88 percent  >>>if unable to maintain oxygen saturations please contact the office  >>>do not smoke with oxygen  >>>can use nasal saline gel or nasal saline rinses to moisturize nose if oxygen causes dryness  3. Medication management  Apply for [employer] for you paperwork   Follow Up:    Return in about 3 months (around 07/20/2020), or if symptoms worsen or fail to improve, for Follow up with Dr. Elsworth Soho.   Please do your part to reduce the spread of COVID-19:      Reduce your risk of any infection  and COVID19 by using the similar precautions used for avoiding the common cold or flu:   Wash your hands often with soap and warm water for at least 20 seconds.  If soap and water are not readily available, use an alcohol-based hand sanitizer with at least 60% alcohol.   If coughing or sneezing, cover your mouth and nose by coughing or sneezing into the elbow areas of your shirt or coat, into a tissue or into your sleeve (not your hands).  WEAR A MASK when in public   Avoid shaking hands  with others and consider head nods or verbal greetings only.  Avoid touching your eyes, nose, or mouth with unwashed hands.   Avoid close contact with people who are sick.  Avoid places or events with large numbers of people in one location, like concerts or sporting events.  If you have some symptoms but not all symptoms, continue to monitor at home and seek medical attention if your symptoms worsen.  If you are having a medical emergency, call 911.   [place] / e-Visit: [email or web address]         [facility]: [facility]: [phone]                   [facility]: [phone]     It is flu season:   >>> Best ways to protect herself from the flu: Receive the yearly flu vaccine, practice good hand hygiene washing with soap and also using hand sanitizer when available, eat a nutritious meals, get adequate rest, hydrate appropriately   Please contact the office if your symptoms worsen or you have concerns that you are not improving.   Thank you for choosing [facility] for your healthcare, and for allowing [place] to partner with you on your healthcare journey. I am thankful to be able to provide care to  you today.   [name] FNP-C

## 2020-04-19 NOTE — Assessment & Plan Note (Signed)
Plan: Keep follow-up with nephrology

## 2020-04-19 NOTE — Progress Notes (Signed)
@Patient  ID: Kyle [name], male    DOB: 1950-12-25, 69 y.o.   MRN: [id number]  Chief Complaint  Patient presents with  . Follow-up    pt over exertion causes sob    Referring provider: Alonna Buckler, MD  HPI:  69 year old male former smoker followed in our office for COPD  PMH: Hypertension, dilated cardiomyopathy, CHF, pulmonary hypertension, dyslipidemia Smoker/ Smoking History: Former smoker.  Quit smoking December/2019.  40-pack-year smoking history. Maintenance:  Anoro Ellipta  Pt of: Dr. Elsworth Soho  04/19/2020  - Visit   69 year old male former smoker followed in our office for COPD.  Patient complaining follow-up with our office from his last visit which was in May/2021 with Dr. Elsworth Soho.  At that office visit it was recommended that he continue on Anoro.  It was recommended that he be on triple therapy for maintenance of his inhaler we can readdress this today.  Patient also has recently completed a shared decision making visit with our office.  That was a lot read as a lung RADS 2.  Plan to repeat in 1 year.  Patient presenting to office and reports that he is still having aspects of shortness of breath.  He feels that he is at his baseline.  He is still using Anoro Ellipta.  We will discuss triple therapy today.  Continues to follow-up with nephrology for chronic kidney disease.  Questionaires / Pulmonary Flowsheets:   ACT:  No flowsheet data found.  MMRC: mMRC Dyspnea Scale mMRC Score  01/29/2020 2  10/13/2019 0  07/14/2019 0    Epworth:  No flowsheet data found.  Tests:   11/26/2018-CTA chest-negative for PE, trace pleural effusions, atelectasis in bases right greater than left  11/20/2018-CT head without contrast- sinuses normal  12/16/2018-spirometry- FVC 1.7 (43% predicted), ratio 64, FEV1 1.1 (36% predicted), consistent with severe airway obstruction  11/21/2018-echocardiogram-LV ejection fraction 40 to 45%, diffuse hypokinesis, right ventricle moderately  dilated, systolic function was moderately reduced, PA P pressure 53  03/05/2020-CT chest lung cancer screening-moderate to advanced changes of centrilobular and paraseptal emphysema, lung RADS 2, left main and three-vessel coronary artery calcifications   FENO:  No results found for: NITRICOXIDE  PFT: No flowsheet data found.  WALK:  SIX MIN WALK 01/29/2020 10/13/2019 07/14/2019 05/15/2019 12/16/2018  Supplimental Oxygen during Test? (L/min) Yes No Yes Yes Yes  O2 Flow Rate 3 - 2 2 4   Type Continuous - Continuous Continuous Continuous  Tech Comments: Patient walked at a regular pace with no need to stop during the walk to rest.  Started patient at 1L of oxygen, increased to 2L when sats dropped to 88%, maintained on 2L until sats dropped to 88% and increased to 3L.  Patient maintained sats on 3L for the remainder of the walk.  The lowest his sats dropped was 90% with 3L. Patient was able to complete 2 laps at a moderate pace. Denied any SOB or chest pain. No O2 needed during or after walk. Patient was able to complete the first lap without  O2. He started the 2nd lap and O2 dropped to 83%, HR 81. He was placed on 2L, recovered to 95% HR75. Patient was able to finish lap at 97% HR82. Denied any SOB, chest or leg pain. - slower pace due to unsteady gait.  Patient began off O2 but desaturated at about 1/2 a lap to 85%.  O2 titrated from 2L to 4L to maintain SpO2 above 90%.  Completed walk on 4L O2.  Mild to Mod SOB noted most of the walk.  [name] RN    Imaging: No results found.  Lab Results:  CBC    Component Value Date/Time   WBC 7.5 02/23/2020 0421   RBC 3.82 (L) 02/23/2020 0421   HGB 11.0 (L) 02/23/2020 0421   HCT 34.6 (L) 02/23/2020 0421   PLT 141 (L) 02/23/2020 0421   MCV 90.6 02/23/2020 0421   MCH 28.8 02/23/2020 0421   MCHC 31.8 02/23/2020 0421   RDW 17.2 (H) 02/23/2020 0421   LYMPHSABS 1.7 01/24/2020 0429   MONOABS 1.3 (H) 01/24/2020 0429   EOSABS 0.2 01/24/2020 0429    BASOSABS 0.0 01/24/2020 0429    BMET    Component Value Date/Time   NA 139 02/23/2020 0421   NA 143 11/30/2019 0937   K 4.7 02/23/2020 0421   CL 103 02/23/2020 0421   CO2 28 02/23/2020 0421   GLUCOSE 80 02/23/2020 0421   BUN 39 (H) 02/23/2020 0421   BUN 21 11/30/2019 0937   CREATININE 1.74 (H) 02/23/2020 0421   CREATININE 0.99 11/12/2014 1344   CALCIUM 9.3 02/23/2020 0421   GFRNONAA 40 (L) 02/23/2020 0421   GFRAA 46 (L) 02/23/2020 0421    BNP    Component Value Date/Time   BNP 138.7 (H) 01/20/2020 1439   BNP 997.9 (H) 11/09/2014 1333    ProBNP    Component Value Date/Time   PROBNP 483.0 (H) 10/19/2008 0328    Specialty Problems      Pulmonary Problems   Chronic respiratory failure with hypoxia (HCC)   COPD with chronic bronchitis and emphysema (HCC)    Severe -FEV1 36%      SOB (shortness of breath)      No Known Allergies  Immunization History  Administered Date(s) Administered  . Fluad Quad(high Dose 65+) 07/14/2019  . Influenza-Unspecified 01/26/2017, 08/10/2017  . Moderna SARS-COVID-2 Vaccination 02/10/2020, 03/09/2020  . Pneumococcal Conjugate-13 01/20/2018  . Pneumococcal Polysaccharide-23 07/14/2019    Past Medical History:  Diagnosis Date  . Aortic insufficiency    MODERATE WITH A BICUSPID AORTIC VAVLE  . Arterial occlusive disease    MULTILEVEL  . CHF (congestive heart failure) ([place])   . COPD (chronic obstructive pulmonary disease) ([place])   . Dilated cardiomyopathy (HCC)    WITH EJECTION FRACTION DOWN TO 20-25%--WITH CONGESTIVE HEART FAILURE  . Edema    LOWER EXTREMETIES  . Hyperglycemia 01/2020  . Hypertension   . Normal coronary arteries 2009  . Orthopnea   . Peripheral arterial disease ([place])   . Pulmonary hypertension ([place])   . SOB (shortness of breath)     Tobacco History: Social History   Tobacco Use  Smoking Status Former Smoker  . Packs/day: 1.00  . Years: 40.00  . Pack years: 40.00  . Types: Cigarettes  . Quit  date: 10/15/2018  . Years since quitting: 1.5  Smokeless Tobacco Never Used  Tobacco Comment   quit 2010 and quit again 2019   Counseling given: Not Answered Comment: quit 2010 and quit again 2019   Continue to not smoke  Outpatient Encounter Medications as of 04/19/2020  Medication Sig  . ACCU-CHEK FASTCLIX LANCETS MISC   . albuterol (PROVENTIL) (2.5 MG/3ML) 0.083% nebulizer solution Take 3 mLs (2.5 mg total) by nebulization every 6 (six) hours as needed for wheezing or shortness of breath.  Marland Kitchen amLODipine (NORVASC) 5 MG tablet TAKE 1 TABLET EVERY DAY (Patient taking differently: Take 5 mg by mouth daily. )  . ANORO ELLIPTA  62.5-25 MCG/INH AEPB INHALE 1 PUFF BY MOUTH ONCE DAILY . APPOINTMENT REQUIRED FOR FUTURE REFILLS (Patient taking differently: Inhale 1 puff into the lungs daily. )  . apixaban (ELIQUIS) 5 MG TABS tablet Take by mouth.  Marland Kitchen glucose blood (ACCU-CHEK GUIDE) test strip one strip (1 each dose) by Other route 2 (two) times daily. Test blood glucose 2 times a day.  Marland Kitchen guaiFENesin (MUCINEX) 600 MG 12 hr tablet Take 2 tablets (1,200 mg total) by mouth 2 (two) times daily. (Patient taking differently: Take 600 mg by mouth 2 (two) times daily. )  . insulin aspart (NOVOLOG) 100 UNIT/ML FlexPen Inject 3 Units into the skin 3 (three) times daily with meals.  . insulin detemir (LEVEMIR) 100 unit/ml SOLN Inject 0.25 mLs (25 Units total) into the skin daily.  . Insulin Pen Needle 32G X 4 MM MISC Use as directed with insulin pen  . Multiple Vitamin (MULTIVITAMIN WITH MINERALS) TABS tablet Take 1 tablet by mouth daily.  . OXYGEN Inhale 4.5 L into the lungs continuous.  . rosuvastatin (CRESTOR) 10 MG tablet Take 1 tablet (10 mg total) by mouth daily at 6 PM. (Patient taking differently: Take 10 mg by mouth daily. )  . BD PEN NEEDLE NANO U/F 32G X 4 MM MISC   . carvedilol (COREG) 6.25 MG tablet Take 1 tablet (6.25 mg total) by mouth 2 (two) times daily with a meal.  . levETIRAcetam (KEPPRA)  500 MG tablet Take 1 tablet (500 mg total) by mouth 2 (two) times daily.  . [DISCONTINUED] warfarin (COUMADIN) 5 MG tablet Take 0.5 tablets (2.5 mg total) by mouth daily.   No facility-administered encounter medications on file as of 04/19/2020.     Review of Systems  Review of Systems  Constitutional: Negative for activity change, chills, fatigue, fever and unexpected weight change.  HENT: Negative for postnasal drip, rhinorrhea, sinus pressure, sinus pain and sore throat.   Eyes: Negative.   Respiratory: Positive for shortness of breath. Negative for cough and wheezing.   Cardiovascular: Negative for chest pain and palpitations.  Gastrointestinal: Negative for constipation, diarrhea, nausea and vomiting.  Endocrine: Negative.   Genitourinary: Negative.   Musculoskeletal: Negative.   Skin: Negative.   Neurological: Negative for dizziness and headaches.  Psychiatric/Behavioral: Negative.  Negative for dysphoric mood. The patient is not nervous/anxious.   All other systems reviewed and are negative.    Physical Exam  BP 128/82 (BP Location: Left Arm, Cuff Size: Normal)   Pulse 82   Temp 97.8 F (36.6 C) (Oral)   Ht 5\' 11"  (1.803 m)   Wt 160 lb 6.4 oz (72.8 kg)   SpO2 97%   BMI 22.37 kg/m   Wt Readings from Last 5 Encounters:  04/19/20 160 lb 6.4 oz (72.8 kg)  03/11/20 156 lb 9.6 oz (71 kg)  03/04/20 154 lb 6.4 oz (70 kg)  02/23/20 147 lb 12.8 oz (67 kg)  01/29/20 146 lb (66.2 kg)    BMI Readings from Last 5 Encounters:  04/19/20 22.37 kg/m  03/11/20 21.84 kg/m  03/04/20 21.53 kg/m  02/23/20 20.61 kg/m  01/29/20 20.36 kg/m     Physical Exam Vitals and nursing note reviewed.  Constitutional:      General: He is not in acute distress.    Appearance: Normal appearance. He is normal weight.  HENT:     Head: Normocephalic and atraumatic.     Right Ear: Hearing, tympanic membrane, ear canal and external ear normal. There is impacted cerumen.  Left Ear:  Hearing, tympanic membrane, ear canal and external ear normal. There is impacted cerumen.     Nose: Nose normal. No mucosal edema or rhinorrhea.     Right Turbinates: Not enlarged.     Left Turbinates: Not enlarged.     Mouth/Throat:     Mouth: Mucous membranes are dry.     Pharynx: Oropharynx is clear. No oropharyngeal exudate.  Eyes:     Pupils: Pupils are equal, round, and reactive to light.  Cardiovascular:     Rate and Rhythm: Normal rate and regular rhythm.     Pulses: Normal pulses.     Heart sounds: Normal heart sounds. No murmur heard.   Pulmonary:     Effort: Pulmonary effort is normal.     Breath sounds: Normal breath sounds. No decreased breath sounds, wheezing or rales.  Abdominal:     General: There is distension.     Tenderness: There is abdominal tenderness.  Musculoskeletal:     Cervical back: Normal range of motion.     Right lower leg: No edema.     Left lower leg: No edema.  Lymphadenopathy:     Cervical: No cervical adenopathy.  Skin:    General: Skin is warm and dry.     Capillary Refill: Capillary refill takes less than 2 seconds.     Findings: No erythema or rash.  Neurological:     General: No focal deficit present.     Mental Status: He is alert and oriented to person, place, and time.     Motor: No weakness.     Coordination: Coordination normal.     Gait: Gait is intact. Gait normal.  Psychiatric:        Mood and Affect: Mood normal.        Behavior: Behavior normal. Behavior is cooperative.        Thought Content: Thought content normal.        Judgment: Judgment normal.       Assessment & Plan:   Acute renal failure (HCC) Plan: Keep follow-up with nephrology  COPD with chronic bronchitis and emphysema ([place]) Discussed with patient today He is agreeable to trial triple therapy Emphasized need to rinse mouth after use  Plan: Trial of Trelegy Ellipta Hold Anoro Ellipta Apply for [employer] for you paperwork for patient  assistance Follow-up in 3 months Notify our office when you finish the first sample of Trelegy Ellipta and let us know how you are doing  Medication management Plan: Apply for [place] for you Paperwork provided today    Return in about 3 months (around 07/20/2020), or if symptoms worsen or fail to improve, for Follow up with Dr. Elsworth Soho.   Lauraine Rinne, NP 04/19/2020   This appointment required 32 minutes of patient care (this includes precharting, chart review, review of results, face-to-face care, etc.).

## 2020-04-19 NOTE — Addendum Note (Signed)
Addended by: Satira Sark D on: 04/19/2020 03:56 PM   Modules accepted: Orders

## 2020-04-19 NOTE — Assessment & Plan Note (Addendum)
Plan: Apply for [employer] for you Paperwork provided today

## 2020-04-19 NOTE — Assessment & Plan Note (Signed)
Discussed with patient today He is agreeable to trial triple therapy Emphasized need to rinse mouth after use  Plan: Trial of Trelegy Ellipta Hold Anoro Ellipta Apply for [employer] for you paperwork for patient assistance Follow-up in 3 months Notify our office when you finish the first sample of Trelegy Ellipta and let us know how you are doing

## 2020-04-22 NOTE — Progress Notes (Deleted)
{Choose 1 Note Type (Video or Telephone):[phone]}   The patient was identified using 2 identifiers.  Date:  04/22/2020   ID:  Kyle [name], DOB 1950/11/27, MRN [id number]  Patient Location: Home Provider Location: Home  PCP:  Alonna Buckler, MD  Cardiologist:  Vinal Rosengrant Martinique MD Electrophysiologist:  None   Evaluation Performed:  Follow-Up Visit  Chief Complaint:  CHF  History of Present Illness:    Kyle [name] is a 69 y.o. male with  a history of systolic CHF with EF of 67-89% in May 2012. Coronary anatomy was normal by cath. He has a bicuspid AV with moderate AS/AI. He had moderate pulmonary HTN. He was treated medically and one of my old notes stated that his LV function showed marked improvement with  Echo in 2011 showing improvement in EF to 55-60%.    Unfortunately , he was lost to follow up and was not taking medication for over 2 years. He also has a history of HTN, hyperlipidemia, and PAD s/p right fem-pop BPG.   When seen in October 2015 he had evidence of recurrent CHF with severe HTN. Echo showed an EF of 20-25% with moderate AS/AI. He was started back on medication with lasix, carvedilol, and quinapril. He did not come back for follow up as instructed. He was seen in Jan. 2016 for surgical clearance for vascular surgery.  He was seen by [name] for claudication and was found to have severe PAD. Revascularization recommended. He underwent aortobifemoral BPG in February 2016. Last seen by me in April 2019- had missed multiple appointments. He did have an Echo in July 2018 showing normalization of LV function with EF 55%.  He is  diabetic and on insulin.  He was admitted in January 2020 with hypotension and shock. Had malaise, N/V and sweats. Treated with antibiotics. Had respiratory failure with hypoxia. Sent home with oxygen. He had Afib initially then converted to NSR. Started on Eliquis. Continued on Coreg. Had AKI with creatinine up to 2.7 that improved to  1.03. LFTs were elevated. Echo showed EF 40-45%. There was RV dysfunction with moderate pulmonary HTN. Moderate AI with mild AS. CT negative for PE but showed advanced emphysema.   He presented in June 2020 with progressive right leg pain and nonhealing wound on his foot. Angiography showed Occluded graft to the right leg and only peroneal run off. He underwent the following surgery on 04/24/19 by Dr Donnetta Hutching. Right femoral and repair with interposition 8 mm Hemashield graft from prior right limb of aortofemoral bypass into into deep femoral artery, #2 right femoral to peroneal bypass with translocated non-reversed great saphenous vein harvested from the left leg. He has not followed up with vascular surgery or cardiology post surgery. Has only followed up with pulmonary for COPD with hypoxemia and pulmonary HTN. Echo in February 2021 showed normalization of EF with mild to moderate AS. There was some concern about an infiltrative cardiomyopathy with severe LVH.   He was admitted 2/28-3/18/21 with acute renal failure, lactic acidosis and hyperkalemia. He required intubation for a period and was intermittently dialysed. ACEi and diuretics held. He was readmitted 3/27-3/31/21 with acute CHF and was successfully diuresed and diuretics were resumed. Baseline creatinine about 2.5.   He was admitted 4/26-4/30/21 with hyperosmolar/hyperglycemic state. Managed with insulin therapy.   The patient {does/does not:200015} have symptoms concerning for COVID-19 infection (fever, chills, cough, or new shortness of breath).    Past Medical History:  Diagnosis Date  . Aortic  insufficiency    MODERATE WITH A BICUSPID AORTIC VAVLE  . Arterial occlusive disease    MULTILEVEL  . CHF (congestive heart failure) ([place])   . COPD (chronic obstructive pulmonary disease) ([place])   . Dilated cardiomyopathy ([facility])    WITH EJECTION FRACTION DOWN TO 20-25%--WITH CONGESTIVE HEART FAILURE  . Edema    LOWER EXTREMETIES  .  Hyperglycemia 01/2020  . Hypertension   . Normal coronary arteries 2009  . Orthopnea   . Peripheral arterial disease ([place])   . Pulmonary hypertension ([place])   . SOB (shortness of breath)    Past Surgical History:  Procedure Laterality Date  . ABDOMINAL AORTAGRAM N/A 11/05/2014   Procedure: ABDOMINAL Maxcine Ham;  Surgeon: Angelia Mould, MD;  Location: [facility] CATH LAB;  Service: Cardiovascular;  Laterality: N/A;  . ABDOMINAL AORTOGRAM W/LOWER EXTREMITY Bilateral 04/21/2019   Procedure: ABDOMINAL AORTOGRAM W/LOWER EXTREMITY;  Surgeon: Angelia Mould, MD;  Location: [place] CV LAB;  Service: Cardiovascular;  Laterality: Bilateral;  . AORTA - BILATERAL FEMORAL ARTERY BYPASS GRAFT N/A 12/18/2014   Procedure: AORTOBIFEMORAL BYPASS GRAFT;  Surgeon: Angelia Mould, MD;  Location: [place];  Service: Vascular;  Laterality: N/A;  . CARDIAC CATHETERIZATION  2009   Nl Cors, EF 20%  . COLONOSCOPY    . ENDARTERECTOMY FEMORAL Left 12/18/2014   Procedure: ENDARTERECTOMY FEMORAL;  Surgeon: Angelia Mould, MD;  Location: [place];  Service: Vascular;  Laterality: Left;  . FALSE ANEURYSM REPAIR Right 04/24/2019   Procedure: REPAIR OF RIGHT FEMORAL ARTERY PSEUDOANEURYSM;  Surgeon: Rosetta Posner, MD;  Location: [place];  Service: Vascular;  Laterality: Right;  . FEMORAL-POPLITEAL BYPASS GRAFT  02/21/2011   right  . FEMORAL-TIBIAL BYPASS GRAFT Right 04/24/2019   Procedure: REDO BYPASS GRAFT RIGHT FEMORAL-TIBIAL ARTERY USING LEFT LEG VEIN & HEMASHIELD GOLD 6mm GRAFT;  Surgeon: Rosetta Posner, MD;  Location: [place];  Service: Vascular;  Laterality: Right;  . IR FLUORO GUIDE CV LINE RIGHT  12/28/2019  . IR REMOVAL TUN CV CATH W/O FL  01/11/2020  . IR US GUIDE VASC ACCESS RIGHT  12/28/2019  . OTHER SURGICAL HISTORY     POST RIGHT FEMOROPOPLITEAL BYPASS GRAFT  . PERIPHERAL VASCULAR CATHETERIZATION  11/05/2014   Procedure: LOWER EXTREMITY ANGIOGRAPHY;  Surgeon: Angelia Mould, MD;  Location: [facility] CATH  LAB;  Service: Cardiovascular;;  . VEIN HARVEST Left 04/24/2019   Procedure: LEFT LEG SAPHENOUS VEIN HARVEST;  Surgeon: Rosetta Posner, MD;  Location: [place];  Service: Vascular;  Laterality: Left;     No outpatient medications have been marked as taking for the 04/24/20 encounter (Appointment) with Martinique, Anali Cabanilla M, MD.     Allergies:   Patient has no known allergies.   Social History   Tobacco Use  . Smoking status: Former Smoker    Packs/day: 1.00    Years: 40.00    Pack years: 40.00    Types: Cigarettes    Quit date: 10/15/2018    Years since quitting: 1.5  . Smokeless tobacco: Never Used  . Tobacco comment: quit 2010 and quit again 2019  Vaping Use  . Vaping Use: Never used  Substance Use Topics  . Alcohol use: No    Alcohol/week: 0.0 standard drinks  . Drug use: No     Family Hx: The patient's family history includes Diabetes in his mother; Hyperlipidemia in his sister; Hypertension in his sister.  ROS:   Please see the history of present illness.    *** All  other systems reviewed and are negative.   Prior CV studies:   The following studies were reviewed today:  Echo 12/13/19: IMPRESSIONS    1. Severe concentric LVH, consider evaluation for infiltrative  cardiomyopathies such as cardiac amyloidosis.  2. Impaired GLS: -14.6%. Left ventricular ejection fraction, by  estimation, is 65 to 70%. The left ventricle has normal function. The left  ventricle has no regional wall motion abnormalities. There is severe  concentric left ventricular hypertrophy. Left  ventricular diastolic parameters are consistent with Grade I diastolic  dysfunction (impaired relaxation).  3. Right ventricular systolic function is normal. The right ventricular  size is normal. There is moderately elevated pulmonary artery systolic  pressure. The estimated right ventricular systolic pressure is 99.3 mmHg.  4. The mitral valve is normal in structure and function. No evidence of  mitral  valve regurgitation. No evidence of mitral stenosis.  5. The aortic valve is normal in structure and function. Aortic valve  regurgitation is moderate. Mild to moderate aortic valve stenosis.  6. The inferior vena cava is normal in size with greater than 50%  respiratory variability, suggesting right atrial pressure of 3 mmHg.   Labs/Other Tests and Data Reviewed:    EKG:  {EKG/Telemetry Strips Reviewed:[phone]}  Recent Labs: 01/20/2020: B Natriuretic Peptide 138.7 02/19/2020: ALT 25 02/23/2020: BUN 39; Creatinine, Ser 1.74; Hemoglobin 11.0; Magnesium 2.3; Platelets 141; Potassium 4.7; Sodium 139   Recent Lipid Panel Lab Results  Component Value Date/Time   CHOL 166 11/30/2019 09:37 AM   TRIG 137 11/30/2019 09:37 AM   HDL 41 11/30/2019 09:37 AM   CHOLHDL 4.0 11/30/2019 09:37 AM   CHOLHDL 4.1 10/16/2016 07:35 AM   LDLCALC 101 (H) 11/30/2019 09:37 AM    Wt Readings from Last 3 Encounters:  04/19/20 160 lb 6.4 oz (72.8 kg)  03/11/20 156 lb 9.6 oz (71 kg)  03/04/20 154 lb 6.4 oz (70 kg)     Objective:    Vital Signs:  There were no vitals taken for this visit.   {[facility] Virtual Exam (Optional):[phone]::"VITAL SIGNS:  reviewed"}  ASSESSMENT & PLAN:    1. Chronic systolic CHF.  He has a history of cardiomyopathy- nonischemic. EF normalized in past when compliant with medication. Most recent Echo showed normal EF. Severe LVH raises concerns about possible infiltrative cardiomyopathy. ? Assess for amyloid.  Reinforced importance of compliance with medical therapy. Not a candidate for ACEi/ARB/Aldactone due to CKD. Low sodium diet. Continue lasix, Coreg.Appears to be euvolemic. We will update Echo now.    2. HTN - BP is well controlled today.  3. PAD s/p redo right fem-pop bypass last June. S/p aortobifemoral BPG Feb. 2016. Recommend he arrange follow up with [facility].   4. Hyperlipidemia. on pravastatin. Enforce compliance. Will update fasting labs today.   5. Biscuspid  AV with mild AS/moderate AI. Follow up Echo  6. Tobacco abuse- quit 2 years ago.   7. DM now on insulin per primary care.  8. Advanced emphysema. On home oxygen now. On Anoro Ellipta  9. Afib in setting of sepsis. In NSR now. Recommend Eliquis to reduce risk of embolic phenomena.   10. CKD stage   COVID-19 Education: The signs and symptoms of COVID-19 were discussed with the patient and how to seek care for testing (follow up with PCP or arrange E-visit).  ***The importance of social distancing was discussed today.  Time:   Today, I have spent *** minutes with the patient with telehealth technology discussing the above problems.  Medication Adjustments/Labs and Tests Ordered: Current medicines are reviewed at length with the patient today.  Concerns regarding medicines are outlined above.   Tests Ordered: No orders of the defined types were placed in this encounter.   Medication Changes: No orders of the defined types were placed in this encounter.   Follow Up:  {F/U Format:[phone]} {follow up:15908}  Signed, Trinetta Alemu Martinique, MD  04/22/2020 7:49 AM    [facility]

## 2020-04-24 ENCOUNTER — Ambulatory Visit: Payer: Medicare HMO | Admitting: Cardiology

## 2020-04-24 ENCOUNTER — Telehealth: Payer: Medicare HMO | Admitting: Cardiology

## 2020-06-05 ENCOUNTER — Telehealth: Payer: Self-pay | Admitting: Pulmonary Disease

## 2020-06-05 MED ORDER — TRELEGY ELLIPTA 100-62.5-25 MCG/INH IN AEPB: 1.0000 | INHALATION_SPRAY | Freq: Every day | RESPIRATORY_TRACT | 5 refills | Status: DC

## 2020-06-05 NOTE — Telephone Encounter (Signed)
Rx for Trelegy has been sent to pharmacy for pt. Called and spoke with pt's wife Lucita Ferrara letting her know this had been done and she verbalized understanding. Nothing further needed.

## 2020-06-22 NOTE — Progress Notes (Deleted)
Kyle [name] Date of Birth: 03-18-1951 Medical Record #[id number]  History of Present Illness: Kyle [name] is seen for follow up CHF.  He has a history of systolic CHF with EF of 22-02% in May 2012. Coronary anatomy was normal by cath. He has a bicuspid AV with moderate AS/AI. He had moderate pulmonary HTN. He was treated medically and one of my old notes stated that his LV function showed marked improvement with  Echo in 2011 showing improvement in EF to 55-60%.    Unfortunately , he was lost to follow up and was not taking medication for over 2 years. He also has a history of HTN, hyperlipidemia, and PAD s/p right fem-pop BPG.   When seen in October 2015 he had evidence of recurrent CHF with severe HTN. Echo showed an EF of 20-25% with moderate AS/AI. He was started back on medication with lasix, carvedilol, and quinapril. He did not come back for follow up as instructed. He was seen in Jan. 2016 for surgical clearance for vascular surgery.  He was seen by [name] for claudication and was found to have severe PAD. Revascularization recommended. He underwent aortobifemoral BPG in February 2016. Last seen by me in April 2019- had missed multiple appointments. He did have an Echo in July 2018 showing normalization of LV function with EF 55%.  He is  diabetic and on insulin.  He was admitted in January 2020 with hypotension and shock. Had malaise, N/V and sweats. Treated with antibiotics. Had respiratory failure with hypoxia. Sent home with oxygen. He had Afib initially then converted to NSR. Started on Eliquis. Continued on Coreg. Had AKI with creatinine up to 2.7 that improved to 1.03. LFTs were elevated. Echo showed EF 40-45%. There was RV dysfunction with moderate pulmonary HTN. Moderate AI with mild AS. CT negative for PE but showed advanced emphysema.   He presented in June 2020 with progressive right leg pain and nonhealing wound on his foot. Angiography showed Occluded graft to the right  leg and only peroneal run off. He underwent the following surgery on 04/24/19 by Dr Donnetta Hutching. Right femoral and repair with interposition 8 mm Hemashield graft from prior right limb of aortofemoral bypass into into deep femoral artery, #2 right femoral to peroneal bypass with translocated non-reversed great saphenous vein harvested from the left leg. He has not followed up with vascular surgery or cardiology post surgery. Has only followed up with pulmonary for COPD with hypoxemia and pulmonary HTN.   He was admitted 2/28-3/18/21 with abdominal pain, N/V, and diarrhea. He had acute renal failure and lactic acidosis of unclear etiology. He required mechanical ventilation for one day and intermittent dialysis but was not on dialysis at discharge. His acidosis cleared. He had encephalopathy. Cranial CT and MRI were negative. CT abd/pelvis was negative. He was switched from Eliquis to Coumadin at DC due to AKI. Lasix was held.  He was readmitted 3/27-3/31/21 with acute CHF. He responded to IV diuresis. He was DC on lasix 20 mg bid. DC creatinine was 2.52.   He was admitted again 4/26-4/30/21 with acute hyperglycemic hyperosmolar state.  Last creatinine 1.74.   On follow up today he reports he is feeling well. Wears oxygen intermittently at home. Has not smoked in 2 years. Thinks breathing is better. No edema. No chest pain. Denies any foot pain or claudication.   Allergies as of 06/26/2020   No Known Allergies     Medication List       Accurate  as of June 22, 2020  7:59 PM. If you have any questions, ask your nurse or doctor.        Accu-Chek FastClix Lancets Misc   Accu-Chek Guide test strip Generic drug: glucose blood one strip (1 each dose) by Other route 2 (two) times daily. Test blood glucose 2 times a day.   albuterol (2.5 MG/3ML) 0.083% nebulizer solution Commonly known as: PROVENTIL Take 3 mLs (2.5 mg total) by nebulization every 6 (six) hours as needed for wheezing or shortness of  breath.   amLODipine 5 MG tablet Commonly known as: NORVASC TAKE 1 TABLET EVERY DAY   Anoro Ellipta 62.5-25 MCG/INH Aepb Generic drug: umeclidinium-vilanterol INHALE 1 PUFF BY MOUTH ONCE DAILY . APPOINTMENT REQUIRED FOR FUTURE REFILLS What changed: See the new instructions.   apixaban 5 MG Tabs tablet Commonly known as: ELIQUIS Take by mouth.   BD Pen Needle Nano U/F 32G X 4 MM Misc Generic drug: Insulin Pen Needle   Insulin Pen Needle 32G X 4 MM Misc Use as directed with insulin pen   carvedilol 6.25 MG tablet Commonly known as: COREG Take 1 tablet (6.25 mg total) by mouth 2 (two) times daily with a meal.   guaiFENesin 600 MG 12 hr tablet Commonly known as: MUCINEX Take 2 tablets (1,200 mg total) by mouth 2 (two) times daily. What changed: how much to take   insulin aspart 100 UNIT/ML FlexPen Commonly known as: NOVOLOG Inject 3 Units into the skin 3 (three) times daily with meals.   insulin detemir 100 unit/ml Soln Commonly known as: LEVEMIR Inject 0.25 mLs (25 Units total) into the skin daily.   levETIRAcetam 500 MG tablet Commonly known as: KEPPRA Take 1 tablet (500 mg total) by mouth 2 (two) times daily.   multivitamin with minerals Tabs tablet Take 1 tablet by mouth daily.   OXYGEN Inhale 4.5 L into the lungs continuous.   rosuvastatin 10 MG tablet Commonly known as: CRESTOR Take 1 tablet (10 mg total) by mouth daily at 6 PM. What changed: when to take this   Trelegy Ellipta 100-62.5-25 MCG/INH Aepb Generic drug: Fluticasone-Umeclidin-Vilant Inhale 1 puff into the lungs daily.        No Known Allergies  Past Medical History:  Diagnosis Date   Aortic insufficiency    MODERATE WITH A BICUSPID AORTIC VAVLE   Arterial occlusive disease    MULTILEVEL   CHF (congestive heart failure) (HCC)    COPD (chronic obstructive pulmonary disease) ([place])    Dilated cardiomyopathy ([place])    WITH EJECTION FRACTION DOWN TO 20-25%--WITH CONGESTIVE HEART  FAILURE   Edema    LOWER EXTREMETIES   Hyperglycemia 01/2020   Hypertension    Normal coronary arteries 2009   Orthopnea    Peripheral arterial disease ([place])    Pulmonary hypertension (HCC)    SOB (shortness of breath)     Past Surgical History:  Procedure Laterality Date   ABDOMINAL AORTAGRAM N/A 11/05/2014   Procedure: ABDOMINAL Maxcine Ham;  Surgeon: Angelia Mould, MD;  Location: [facility] CATH LAB;  Service: Cardiovascular;  Laterality: N/A;   ABDOMINAL AORTOGRAM W/LOWER EXTREMITY Bilateral 04/21/2019   Procedure: ABDOMINAL AORTOGRAM W/LOWER EXTREMITY;  Surgeon: Angelia Mould, MD;  Location: [facility] CV LAB;  Service: Cardiovascular;  Laterality: Bilateral;   AORTA - BILATERAL FEMORAL ARTERY BYPASS GRAFT N/A 12/18/2014   Procedure: AORTOBIFEMORAL BYPASS GRAFT;  Surgeon: Angelia Mould, MD;  Location: [facility];  Service: Vascular;  Laterality: N/A;   CARDIAC CATHETERIZATION  2009   Nl Cors, EF 20%   COLONOSCOPY     ENDARTERECTOMY FEMORAL Left 12/18/2014   Procedure: ENDARTERECTOMY FEMORAL;  Surgeon: Angelia Mould, MD;  Location: [facility];  Service: Vascular;  Laterality: Left;   FALSE ANEURYSM REPAIR Right 04/24/2019   Procedure: REPAIR OF RIGHT FEMORAL ARTERY PSEUDOANEURYSM;  Surgeon: Rosetta Posner, MD;  Location: [facility] OR;  Service: Vascular;  Laterality: Right;   FEMORAL-POPLITEAL BYPASS GRAFT  02/21/2011   right   FEMORAL-TIBIAL BYPASS GRAFT Right 04/24/2019   Procedure: REDO BYPASS GRAFT RIGHT FEMORAL-TIBIAL ARTERY USING LEFT LEG VEIN & HEMASHIELD GOLD 77mm GRAFT;  Surgeon: Rosetta Posner, MD;  Location: [facility];  Service: Vascular;  Laterality: Right;   IR FLUORO GUIDE CV LINE RIGHT  12/28/2019   IR REMOVAL TUN CV CATH W/O FL  01/11/2020   IR US GUIDE VASC ACCESS RIGHT  12/28/2019   OTHER SURGICAL HISTORY     POST RIGHT FEMOROPOPLITEAL BYPASS GRAFT   PERIPHERAL VASCULAR CATHETERIZATION  11/05/2014   Procedure: LOWER EXTREMITY ANGIOGRAPHY;  Surgeon:  Angelia Mould, MD;  Location: [facility] CATH LAB;  Service: Cardiovascular;;   VEIN HARVEST Left 04/24/2019   Procedure: LEFT LEG SAPHENOUS VEIN HARVEST;  Surgeon: Rosetta Posner, MD;  Location: [facility] OR;  Service: Vascular;  Laterality: Left;    Social History   Socioeconomic History   Marital status: Married    Spouse name: Not on file   Number of children: 1   Years of education: Not on file   Highest education level: Not on file  Occupational History   Occupation: retired    Fish farm manager: [employer]  Tobacco Use   Smoking status: Former Smoker    Packs/day: 1.00    Years: 40.00    Pack years: 40.00    Types: Cigarettes    Quit date: 10/15/2018    Years since quitting: 1.6   Smokeless tobacco: Never Used   Tobacco comment: quit 2010 and quit again 2019  Vaping Use   Vaping Use: Never used  Substance and Sexual Activity   Alcohol use: No    Alcohol/week: 0.0 standard drinks   Drug use: No   Sexual activity: Not on file  Other Topics Concern   Not on file  Social History Narrative   Not on file   Social Determinants of Health   Financial Resource Strain:    Difficulty of Paying Living Expenses: Not on file  Food Insecurity:    Worried About [place] in the Last Year: Not on file   [place] in the Last Year: Not on file  Transportation Needs:    Lack of Transportation (Medical): Not on file   Lack of Transportation (Non-Medical): Not on file  Physical Activity:    Days of Exercise per Week: Not on file   Minutes of Exercise per Session: Not on file  Stress:    Feeling of Stress : Not on file  Social Connections:    Frequency of Communication with Friends and Family: Not on file   Frequency of Social Gatherings with Friends and Family: Not on file   Attends Religious Services: Not on file   Active Member of Clubs or Organizations: Not on file   Attends Archivist Meetings: Not on file   Marital Status: Not  on file    Family History  Problem Relation Age of Onset   Diabetes Mother    Hyperlipidemia Sister    Hypertension Sister  Review of Systems: As noted in HPI.   All other systems were reviewed and are negative.  Physical Exam: There were no vitals taken for this visit. There were no vitals filed for this visit.  GENERAL:  Well appearing, thin BM in NAD HEENT:  PERRL, EOMI, sclera are clear. Oropharynx is clear. NECK:  No jugular venous distention, carotid upstroke brisk and symmetric, no bruits, no thyromegaly or adenopathy LUNGS:  Clear to auscultation bilaterally CHEST:  Unremarkable HEART:  RRR,  PMI not displaced or sustained,S1 and S2 within normal limits, no S3, no S4: no clicks, no rubs, gr 1/6 systolic and diastolic murmur RUSB.  ABD:  Soft, nontender. BS +, no masses or bruits. No hepatomegaly, no splenomegaly EXT:  Pedal pulses are weak.   no edema, no cyanosis no clubbing SKIN:  Warm and dry.  No rashes NEURO:  Alert and oriented x 3. Cranial nerves II through XII intact. PSYCH:  Cognitively intact   LABORATORY DATA: . Lab Results  Component Value Date   WBC 7.5 02/23/2020   HGB 11.0 (L) 02/23/2020   HCT 34.6 (L) 02/23/2020   PLT 141 (L) 02/23/2020   GLUCOSE 80 02/23/2020   CHOL 166 11/30/2019   TRIG 137 11/30/2019   HDL 41 11/30/2019   LDLCALC 101 (H) 11/30/2019   ALT 25 02/19/2020   AST 16 02/19/2020   NA 139 02/23/2020   K 4.7 02/23/2020   CL 103 02/23/2020   CREATININE 1.74 (H) 02/23/2020   BUN 39 (H) 02/23/2020   CO2 28 02/23/2020   TSH 3.244 08/20/2014   INR 1.9 (H) 02/23/2020   HGBA1C 8.0 (H) 12/24/2019    Labs dated 07/28/17: BUN 29, creatinine 1.03. Other chemistries normal. A1c 6.2%. Cholesterol 137, Triglycerides 63, HDL 43, LDL 81, TSH normal. Labs dated 12/21/17: cholesterol 123, triglycerides 64, HDL 41, LDL 69. A1c 6.3%. CMET and CBC normal. TSH normal.  Echo: 05/06/17:  Study Conclusions  - Left ventricle: The cavity size  was normal. Wall thickness was   increased in a pattern of mild LVH. Systolic function was normal.   The estimated ejection fraction was in the range of 50% to 55%.   Wall motion was normal; there were no regional wall motion   abnormalities. Doppler parameters are consistent with abnormal   left ventricular relaxation (grade 1 diastolic dysfunction). - Aortic valve: There was mild stenosis. There was mild to moderate   regurgitation. - Mitral valve: Calcified annulus. - Left atrium: The atrium was mildly dilated. - Right ventricle: The cavity size was mildly dilated. - Right atrium: The atrium was mildly dilated. - Pulmonary arteries: Systolic pressure was moderately increased.  Impressions:  - Normal LV systolic function; mild LVH; mild diastolic   dysfunction; calcified aortic valve with mild AS (mean gradient   18 mmHg) and mild to moderate AI; mild LAE; mild RAE and RVE;   trace TR with moderately elevated pulmonary pressure.  Echo 11/21/18: Study Conclusions  - Left ventricle: The cavity size was normal. Systolic function was   mildly to moderately reduced. The estimated ejection fraction was   in the range of 40% to 45%. Diffuse hypokinesis. The study is not   technically sufficient to allow evaluation of LV diastolic   function. - Ventricular septum: The contour showed diastolic flattening. - Aortic valve: Valve mobility was restricted. There was mild   stenosis. There was moderate regurgitation. - Right ventricle: The cavity size was moderately dilated. Wall   thickness was normal. Systolic  function was moderately reduced. - Right atrium: The atrium was severely dilated. - Tricuspid valve: There was moderate regurgitation. - Pulmonary arteries: Systolic pressure was moderately increased.   PA peak pressure: 53 mm Hg (S).  Impressions:  - Compared to the prior study, there has been no significant   interval change.  Echo 12/13/19: IMPRESSIONS    1. Severe  concentric LVH, consider evaluation for infiltrative  cardiomyopathies such as cardiac amyloidosis.  2. Impaired GLS: -14.6%. Left ventricular ejection fraction, by  estimation, is 65 to 70%. The left ventricle has normal function. The left  ventricle has no regional wall motion abnormalities. There is severe  concentric left ventricular hypertrophy. Left  ventricular diastolic parameters are consistent with Grade I diastolic  dysfunction (impaired relaxation).  3. Right ventricular systolic function is normal. The right ventricular  size is normal. There is moderately elevated pulmonary artery systolic  pressure. The estimated right ventricular systolic pressure is 80.1 mmHg.  4. The mitral valve is normal in structure and function. No evidence of  mitral valve regurgitation. No evidence of mitral stenosis.  5. The aortic valve is normal in structure and function. Aortic valve  regurgitation is moderate. Mild to moderate aortic valve stenosis.  6. The inferior vena cava is normal in size with greater than 50%  respiratory variability, suggesting right atrial pressure of 3 mmHg.    Assessment / Plan: 1. Chronic systolic CHF.  He has a history of cardiomyopathy- nonischemic. EF normalized in past when compliant with medication. Most recent Echo in Feb showed improved EF to normal with severe LVH. Prior lower EF attributed to sepsis and noncompliance.Reinforced importance of compliance with medical therapy. Low sodium diet. Continue lasix, Coreg, Quinapril. Appears to be euvolemic. We will update Echo now.    2. HTN - BP is well controlled today.  3. PAD s/p redo right fem-pop bypass last June. S/p aortobifemoral BPG Feb. 2016. Recommend he arrange follow up with [facility].   4. Hyperlipidemia. on pravastatin. Enforce compliance. Will update fasting labs today.   5. Biscuspid AV with mild AS/moderate AI. Follow up Echo  6. Tobacco abuse- quit 2 years ago.   7. DM now on insulin per  primary care.  8. Advanced emphysema. On home oxygen now. On Anoro Ellipta  9. Afib in setting of sepsis. In NSR now. Recommend Eliquis to reduce risk of embolic phenomena.   10. CKD stage 3a  Follow up in 6 months.

## 2020-06-25 NOTE — Progress Notes (Signed)
Kyle [name] Date of Birth: December 13, 1950 Medical Record #[id number]  History of Present Illness: Kyle [name] is seen for follow up CHF.  He has a history of systolic CHF with EF of 49-70% in May 2012. Coronary anatomy was normal by cath. He has a bicuspid AV with moderate AS/AI. He had moderate pulmonary HTN. He was treated medically and one of my old notes stated that his LV function showed marked improvement with  Echo in 2011 showing improvement in EF to 55-60%.   Unfortunately , he was lost to follow up and was not taking medication for over 2 years. He also has a history of HTN, hyperlipidemia, and PAD s/p right fem-pop BPG.   When seen in October 2015 he had evidence of recurrent CHF with severe HTN. Echo showed an EF of 20-25% with moderate AS/AI. He was started back on medication with lasix, carvedilol, and quinapril. He did not come back for follow up as instructed. He was seen in Jan. 2016 for surgical clearance for vascular surgery.  He was seen by Kyle [name] for claudication and was found to have severe PAD. Revascularization recommended. He underwent aortobifemoral BPG in February 2016. Last seen by me in April 2019- had missed multiple appointments. He did have an Echo in July 2018 showing normalization of LV function with EF 55%.  He is  diabetic and on insulin.  He was admitted in January 2020 with hypotension and shock. Had malaise, N/V and sweats. Treated with antibiotics. Had respiratory failure with hypoxia. Sent home with oxygen. He had Afib initially then converted to NSR. Started on Eliquis. Continued on Coreg. Had AKI with creatinine up to 2.7 that improved to 1.03. LFTs were elevated. Echo showed EF 40-45%. There was RV dysfunction with moderate pulmonary HTN. Moderate AI with mild AS. CT negative for PE but showed advanced emphysema.   He presented in June 2020 with progressive right leg pain and nonhealing wound on his foot. Angiography showed Occluded graft to the right leg  and only peroneal run off. He underwent the following surgery on 04/24/19 by Dr Kyle [name]. Right femoral and repair with interposition 8 mm Hemashield graft from prior right limb of aortofemoral bypass into into deep femoral artery, #2 right femoral to peroneal bypass with translocated non-reversed great saphenous vein harvested from the left leg. He has not followed up with vascular surgery or cardiology post surgery. Has only followed up with pulmonary for COPD with hypoxemia and pulmonary HTN.   He was admitted 2/28-3/18/21 with abdominal pain, N/V, and diarrhea. He had acute renal failure and lactic acidosis of unclear etiology. He required mechanical ventilation for one day and intermittent dialysis but was not on dialysis at discharge. His acidosis cleared. He had encephalopathy. Cranial CT and MRI were negative. CT abd/pelvis was negative. He was switched from Eliquis to Coumadin at DC due to AKI. Lasix was held.  He was readmitted 3/27-3/31/21 with acute CHF. He responded to IV diuresis. He was DC on lasix 20 mg bid. DC creatinine was 2.52.   He was admitted again 4/26-4/30/21 with acute hyperglycemic hyperosmolar state.  Last creatinine 1.74. lasix was discontinued at discharge.  On follow up today he reports he is feeling well. Wears oxygen intermittently at home. Doesn't sleep with it. He is not smoking. He denies any increase SOB, chest pain or edema. States legs feel great. He is planning on having cataract surgery. Has calluses on his feet - needs referral to podiatry.    Allergies  as of 06/27/2020   No Known Allergies     Medication List       Accurate as of June 27, 2020  3:27 PM. If you have any questions, ask your nurse or doctor.        Accu-Chek FastClix Lancets Misc   Accu-Chek Guide test strip Generic drug: glucose blood one strip (1 each dose) by Other route 2 (two) times daily. Test blood glucose 2 times a day.   albuterol (2.5 MG/3ML) 0.083% nebulizer  solution Commonly known as: PROVENTIL Take 3 mLs (2.5 mg total) by nebulization every 6 (six) hours as needed for wheezing or shortness of breath.   amLODipine 5 MG tablet Commonly known as: NORVASC TAKE 1 TABLET EVERY DAY   Anoro Ellipta 62.5-25 MCG/INH Aepb Generic drug: umeclidinium-vilanterol INHALE 1 PUFF BY MOUTH ONCE DAILY . APPOINTMENT REQUIRED FOR FUTURE REFILLS What changed: See the new instructions.   apixaban 5 MG Tabs tablet Commonly known as: ELIQUIS Take by mouth.   BD Pen Needle Nano U/F 32G X 4 MM Misc Generic drug: Insulin Pen Needle   Insulin Pen Needle 32G X 4 MM Misc Use as directed with insulin pen   carvedilol 6.25 MG tablet Commonly known as: COREG Take 1 tablet (6.25 mg total) by mouth 2 (two) times daily with a meal.   guaiFENesin 600 MG 12 hr tablet Commonly known as: MUCINEX Take 2 tablets (1,200 mg total) by mouth 2 (two) times daily. What changed: how much to take   insulin aspart 100 UNIT/ML FlexPen Commonly known as: NOVOLOG Inject 3 Units into the skin 3 (three) times daily with meals.   insulin detemir 100 unit/ml Soln Commonly known as: LEVEMIR Inject 0.25 mLs (25 Units total) into the skin daily.   levETIRAcetam 500 MG tablet Commonly known as: KEPPRA Take 1 tablet (500 mg total) by mouth 2 (two) times daily.   multivitamin with minerals Tabs tablet Take 1 tablet by mouth daily.   OXYGEN Inhale 4.5 L into the lungs continuous.   rosuvastatin 10 MG tablet Commonly known as: CRESTOR Take 1 tablet (10 mg total) by mouth daily at 6 PM. What changed: when to take this   Trelegy Ellipta 100-62.5-25 MCG/INH Aepb Generic drug: Fluticasone-Umeclidin-Vilant Inhale 1 puff into the lungs daily.        No Known Allergies  Past Medical History:  Diagnosis Date  . Aortic insufficiency    MODERATE WITH A BICUSPID AORTIC VAVLE  . Arterial occlusive disease    MULTILEVEL  . CHF (congestive heart failure) ([place])   . COPD (chronic  obstructive pulmonary disease) ([place])   . Dilated cardiomyopathy (H[facility])    WITH EJECTION FRACTION DOWN TO 20-25%--WITH CONGESTIVE HEART FAILURE  . Edema    LOWER EXTREMETIES  . Hyperglycemia 01/2020  . Hypertension   . Normal coronary arteries 2009  . Orthopnea   . Peripheral arterial disease ([place])   . Pulmonary hypertension ([place])   . SOB (shortness of breath)     Past Surgical History:  Procedure Laterality Date  . ABDOMINAL AORTAGRAM N/A 11/05/2014   Procedure: ABDOMINAL Maxcine Ham;  Surgeon: Angelia Mould, MD;  Location: [facility] CATH LAB;  Service: Cardiovascular;  Laterality: N/A;  . ABDOMINAL AORTOGRAM W/LOWER EXTREMITY Bilateral 04/21/2019   Procedure: ABDOMINAL AORTOGRAM W/LOWER EXTREMITY;  Surgeon: Angelia Mould, MD;  Location: [facility] CV LAB;  Service: Cardiovascular;  Laterality: Bilateral;  . AORTA - BILATERAL FEMORAL ARTERY BYPASS GRAFT N/A 12/18/2014   Procedure: AORTOBIFEMORAL BYPASS GRAFT;  Surgeon: Angelia Mould, MD;  Location: [place];  Service: Vascular;  Laterality: N/A;  . CARDIAC CATHETERIZATION  2009   Nl Cors, EF 20%  . COLONOSCOPY    . ENDARTERECTOMY FEMORAL Left 12/18/2014   Procedure: ENDARTERECTOMY FEMORAL;  Surgeon: Angelia Mould, MD;  Location: [place];  Service: Vascular;  Laterality: Left;  . FALSE ANEURYSM REPAIR Right 04/24/2019   Procedure: REPAIR OF RIGHT FEMORAL ARTERY PSEUDOANEURYSM;  Surgeon: Rosetta Posner, MD;  Location: [place];  Service: Vascular;  Laterality: Right;  . FEMORAL-POPLITEAL BYPASS GRAFT  02/21/2011   right  . FEMORAL-TIBIAL BYPASS GRAFT Right 04/24/2019   Procedure: REDO BYPASS GRAFT RIGHT FEMORAL-TIBIAL ARTERY USING LEFT LEG VEIN & HEMASHIELD GOLD 80mm GRAFT;  Surgeon: Rosetta Posner, MD;  Location: [place];  Service: Vascular;  Laterality: Right;  . IR FLUORO GUIDE CV LINE RIGHT  12/28/2019  . IR REMOVAL TUN CV CATH W/O FL  01/11/2020  . IR US GUIDE VASC ACCESS RIGHT  12/28/2019  . OTHER SURGICAL HISTORY     POST  RIGHT FEMOROPOPLITEAL BYPASS GRAFT  . PERIPHERAL VASCULAR CATHETERIZATION  11/05/2014   Procedure: LOWER EXTREMITY ANGIOGRAPHY;  Surgeon: Angelia Mould, MD;  Location: [facility] CATH LAB;  Service: Cardiovascular;;  . VEIN HARVEST Left 04/24/2019   Procedure: LEFT LEG SAPHENOUS VEIN HARVEST;  Surgeon: Rosetta Posner, MD;  Location: [facility] OR;  Service: Vascular;  Laterality: Left;    Social History   Socioeconomic History  . Marital status: Married    Spouse name: Not on file  . Number of children: 1  . Years of education: Not on file  . Highest education level: Not on file  Occupational History  . Occupation: retired    Fish farm manager: [employer]  Tobacco Use  . Smoking status: Former Smoker    Packs/day: 1.00    Years: 40.00    Pack years: 40.00    Types: Cigarettes    Quit date: 10/15/2018    Years since quitting: 1.7  . Smokeless tobacco: Never Used  . Tobacco comment: quit 2010 and quit again 2019  Vaping Use  . Vaping Use: Never used  Substance and Sexual Activity  . Alcohol use: No    Alcohol/week: 0.0 standard drinks  . Drug use: No  . Sexual activity: Not on file  Other Topics Concern  . Not on file  Social History Narrative  . Not on file   Social Determinants of Health   Financial Resource Strain:   . Difficulty of Paying Living Expenses: Not on file  Food Insecurity:   . Worried About Charity fundraiser in the Last Year: Not on file  . Ran Out of Food in the Last Year: Not on file  Transportation Needs:   . Lack of Transportation (Medical): Not on file  . Lack of Transportation (Non-Medical): Not on file  Physical Activity:   . Days of Exercise per Week: Not on file  . Minutes of Exercise per Session: Not on file  Stress:   . Feeling of Stress : Not on file  Social Connections:   . Frequency of Communication with Friends and Family: Not on file  . Frequency of Social Gatherings with Friends and Family: Not on file  . Attends Religious Services: Not on  file  . Active Member of Clubs or Organizations: Not on file  . Attends Archivist Meetings: Not on file  . Marital Status: Not on file    Family History  Problem Relation Age of Onset  . Diabetes Mother   . Hyperlipidemia Sister   . Hypertension Sister     Review of Systems: As noted in HPI.   All other systems were reviewed and are negative.  Physical Exam: BP 140/80   Pulse 63   Ht 5\' 11"  (1.803 m)   Wt 160 lb 12.8 oz (72.9 kg)   SpO2 (!) 89%   BMI 22.43 kg/m  Filed Weights   06/27/20 1449  Weight: 160 lb 12.8 oz (72.9 kg)    GENERAL:  Well appearing, thin BM in NAD HEENT:  PERRL, EOMI, sclera are clear. Oropharynx is clear. NECK:  No jugular venous distention, carotid upstroke brisk and symmetric, no bruits, no thyromegaly or adenopathy LUNGS:  Clear to auscultation bilaterally CHEST:  Unremarkable HEART:  RRR,  PMI not displaced or sustained,S1 and S2 within normal limits, no S3, no S4: no clicks, no rubs, gr 1/6 systolic and diastolic murmur RUSB.  ABD:  Soft, nontender. BS +, no masses or bruits. No hepatomegaly, no splenomegaly EXT:  Pedal pulses are weak.   no edema, no cyanosis no clubbing SKIN:  Warm and dry.  No rashes NEURO:  Alert and oriented x 3. Cranial nerves II through XII intact. PSYCH:  Cognitively intact   LABORATORY DATA: . Lab Results  Component Value Date   WBC 7.5 02/23/2020   HGB 11.0 (L) 02/23/2020   HCT 34.6 (L) 02/23/2020   PLT 141 (L) 02/23/2020   GLUCOSE 80 02/23/2020   CHOL 166 11/30/2019   TRIG 137 11/30/2019   HDL 41 11/30/2019   LDLCALC 101 (H) 11/30/2019   ALT 25 02/19/2020   AST 16 02/19/2020   NA 139 02/23/2020   K 4.7 02/23/2020   CL 103 02/23/2020   CREATININE 1.74 (H) 02/23/2020   BUN 39 (H) 02/23/2020   CO2 28 02/23/2020   TSH 3.244 08/20/2014   INR 1.9 (H) 02/23/2020   HGBA1C 8.0 (H) 12/24/2019    Labs dated 07/28/17: BUN 29, creatinine 1.03. Other chemistries normal. A1c 6.2%. Cholesterol 137,  Triglycerides 63, HDL 43, LDL 81, TSH normal. Labs dated 12/21/17: cholesterol 123, triglycerides 64, HDL 41, LDL 69. A1c 6.3%. CMET and CBC normal. TSH normal.  Echo: 05/06/17:  Study Conclusions  - Left ventricle: The cavity size was normal. Wall thickness was   increased in a pattern of mild LVH. Systolic function was normal.   The estimated ejection fraction was in the range of 50% to 55%.   Wall motion was normal; there were no regional wall motion   abnormalities. Doppler parameters are consistent with abnormal   left ventricular relaxation (grade 1 diastolic dysfunction). - Aortic valve: There was mild stenosis. There was mild to moderate   regurgitation. - Mitral valve: Calcified annulus. - Left atrium: The atrium was mildly dilated. - Right ventricle: The cavity size was mildly dilated. - Right atrium: The atrium was mildly dilated. - Pulmonary arteries: Systolic pressure was moderately increased.  Impressions:  - Normal LV systolic function; mild LVH; mild diastolic   dysfunction; calcified aortic valve with mild AS (mean gradient   18 mmHg) and mild to moderate AI; mild LAE; mild RAE and RVE;   trace TR with moderately elevated pulmonary pressure.  Echo 11/21/18: Study Conclusions  - Left ventricle: The cavity size was normal. Systolic function was   mildly to moderately reduced. The estimated ejection fraction was   in the range of 40% to 45%. Diffuse hypokinesis. The study  is not   technically sufficient to allow evaluation of LV diastolic   function. - Ventricular septum: The contour showed diastolic flattening. - Aortic valve: Valve mobility was restricted. There was mild   stenosis. There was moderate regurgitation. - Right ventricle: The cavity size was moderately dilated. Wall   thickness was normal. Systolic function was moderately reduced. - Right atrium: The atrium was severely dilated. - Tricuspid valve: There was moderate regurgitation. - Pulmonary  arteries: Systolic pressure was moderately increased.   PA peak pressure: 53 mm Hg (S).  Impressions:  - Compared to the prior study, there has been no significant   interval change.  Echo 12/13/19: IMPRESSIONS    1. Severe concentric LVH, consider evaluation for infiltrative  cardiomyopathies such as cardiac amyloidosis.  2. Impaired GLS: -14.6%. Left ventricular ejection fraction, by  estimation, is 65 to 70%. The left ventricle has normal function. The left  ventricle has no regional wall motion abnormalities. There is severe  concentric left ventricular hypertrophy. Left  ventricular diastolic parameters are consistent with Grade I diastolic  dysfunction (impaired relaxation).  3. Right ventricular systolic function is normal. The right ventricular  size is normal. There is moderately elevated pulmonary artery systolic  pressure. The estimated right ventricular systolic pressure is 27.0 mmHg.  4. The mitral valve is normal in structure and function. No evidence of  mitral valve regurgitation. No evidence of mitral stenosis.  5. The aortic valve is normal in structure and function. Aortic valve  regurgitation is moderate. Mild to moderate aortic valve stenosis.  6. The inferior vena cava is normal in size with greater than 50%  respiratory variability, suggesting right atrial pressure of 3 mmHg.    Assessment / Plan: 1. Chronic systolic CHF.  He has a history of cardiomyopathy- nonischemic. EF normalized in past when compliant with medication. Most recent Echo in Feb showed improved EF to normal with severe LVH. Reinforced importance of compliance with medical therapy. Low sodium diet. Continue  Coreg, amlodipine. Appears to be euvolemic. Avoid lasix for now. Avoid ARB/ACEi due to CKD   2. HTN - BP is well controlled today.  3. PAD s/p redo right fem-pop bypass last June 2020. S/p aortobifemoral BPG Feb. 2016. Follow up with [facility].   4. Hyperlipidemia. on Crestor 10 mg  daily. Enforce compliance. Plans to have follow up lab work with primary care.   5. Biscuspid AV with mild AS/moderate AI. Will monitor.   6. Tobacco abuse- quit   7. DM now on insulin per primary care.  8. Advanced emphysema. On home oxygen intermittently. On Trelegy  9. Afib in setting of sepsis. In NSR now. Continue Eliquis  10. CKD stage 3a  Follow up in 6 months.

## 2020-06-26 ENCOUNTER — Ambulatory Visit: Payer: Medicare HMO | Admitting: Cardiology

## 2020-06-27 ENCOUNTER — Other Ambulatory Visit: Payer: Self-pay

## 2020-06-27 ENCOUNTER — Encounter: Payer: Self-pay | Admitting: Cardiology

## 2020-06-27 ENCOUNTER — Ambulatory Visit (INDEPENDENT_AMBULATORY_CARE_PROVIDER_SITE_OTHER): Payer: Medicare HMO | Admitting: Cardiology

## 2020-06-27 VITALS — BP 140/80 | HR 63 | Ht 71.0 in | Wt 160.8 lb

## 2020-06-27 DIAGNOSIS — I739 Peripheral vascular disease, unspecified: Secondary | ICD-10-CM | POA: Diagnosis not present

## 2020-06-27 DIAGNOSIS — I351 Nonrheumatic aortic (valve) insufficiency: Secondary | ICD-10-CM | POA: Diagnosis not present

## 2020-06-27 DIAGNOSIS — I5022 Chronic systolic (congestive) heart failure: Secondary | ICD-10-CM

## 2020-06-27 DIAGNOSIS — N1831 Chronic kidney disease, stage 3a: Secondary | ICD-10-CM

## 2020-06-27 DIAGNOSIS — I10 Essential (primary) hypertension: Secondary | ICD-10-CM

## 2020-08-05 ENCOUNTER — Other Ambulatory Visit: Payer: Self-pay

## 2020-08-05 ENCOUNTER — Ambulatory Visit: Payer: Medicare HMO | Admitting: Podiatry

## 2020-08-05 DIAGNOSIS — Z794 Long term (current) use of insulin: Secondary | ICD-10-CM | POA: Diagnosis not present

## 2020-08-05 DIAGNOSIS — M2141 Flat foot [pes planus] (acquired), right foot: Secondary | ICD-10-CM

## 2020-08-05 DIAGNOSIS — M2142 Flat foot [pes planus] (acquired), left foot: Secondary | ICD-10-CM

## 2020-08-05 DIAGNOSIS — L84 Corns and callosities: Secondary | ICD-10-CM | POA: Diagnosis not present

## 2020-08-05 DIAGNOSIS — E119 Type 2 diabetes mellitus without complications: Secondary | ICD-10-CM

## 2020-08-05 DIAGNOSIS — I739 Peripheral vascular disease, unspecified: Secondary | ICD-10-CM | POA: Diagnosis not present

## 2020-08-05 DIAGNOSIS — B351 Tinea unguium: Secondary | ICD-10-CM | POA: Diagnosis not present

## 2020-08-05 NOTE — Progress Notes (Signed)
    Subjective:  Patient ID: Kyle [name], male    DOB: October 05, 1951,  MRN: [id number]  Chief Complaint  Patient presents with  . diabetic foot exam    Callous on Bilateral foot     69 y.o. male presents with the above complaint. History confirmed with patient.   Objective:  Physical Exam: warm, good capillary refill, no trophic changes or ulcerative lesions, normal DP and PT pulses and abnormal sensory exam with loss of protective sensation.  Onychomycosis x10.  Submet 1 and 5 calluses bilaterally. Assessment:   1. Diabetes mellitus type 2, insulin dependent (Kyle [name])   2. PVD (peripheral vascular disease) (Kyle [name])   3. Onychomycosis   4. Callus of foot      Plan:  Patient was evaluated and treated and all questions answered.  Patient educated on diabetes. Discussed proper diabetic foot care and discussed risks and complications of disease. Educated patient in depth on reasons to return to the office immediately should he/she discover anything concerning or new on the feet. All questions answered. Discussed proper shoes as well. .  All symptomatic hyperkeratoses were safely debrided with a sterile #15 blade to patient's level of comfort without incident. We discussed preventative and palliative care of these lesions including supportive and accommodative shoegear, padding, prefabricated and custom molded accommodative orthoses, use of a pumice stone and lotions/creams daily.  Discussed the etiology and treatment options for the condition in detail with the patient. Educated patient on the topical and oral treatment options for mycotic nails. Recommended debridement of the nails today. Sharp and mechanical debridement performed of all painful and mycotic nails today. Nails debrided in length and thickness using a nail nipper and a mechanical burr to level of comfort. Discussed treatment options including appropriate shoe gear. Follow up as needed for painful nails.    Return in about 3  months (around 11/05/2020) for [facility]

## 2020-08-05 NOTE — Patient Instructions (Addendum)
Look for urea 40% cream or ointment and apply to the thickened dry skin / calluses. This can be bought over the counter, at a pharmacy or online such as [employer].   Diabetes Mellitus and Foot Care Foot care is an important part of your health, especially when you have diabetes. Diabetes may cause you to have problems because of poor blood flow (circulation) to your feet and legs, which can cause your skin to:  Become thinner and drier.  Break more easily.  Heal more slowly.  Peel and crack. You may also have nerve damage (neuropathy) in your legs and feet, causing decreased feeling in them. This means that you may not notice minor injuries to your feet that could lead to more serious problems. Noticing and addressing any potential problems early is the best way to prevent future foot problems. How to care for your feet Foot hygiene  Wash your feet daily with warm water and mild soap. Do not use hot water. Then, pat your feet and the areas between your toes until they are completely dry. Do not soak your feet as this can dry your skin.  Trim your toenails straight across. Do not dig under them or around the cuticle. File the edges of your nails with an emery board or nail file.  Apply a moisturizing lotion or petroleum jelly to the skin on your feet and to dry, brittle toenails. Use lotion that does not contain alcohol and is unscented. Do not apply lotion between your toes. Shoes and socks  Wear clean socks or stockings every day. Make sure they are not too tight. Do not wear knee-high stockings since they may decrease blood flow to your legs.  Wear shoes that fit properly and have enough cushioning. Always look in your shoes before you put them on to be sure there are no objects inside.  To break in new shoes, wear them for just a few hours a day. This prevents injuries on your feet. Wounds, scrapes, corns, and calluses  Check your feet daily for blisters, cuts, bruises, sores, and  redness. If you cannot see the bottom of your feet, use a mirror or ask someone for help.  Do not cut corns or calluses or try to remove them with medicine.  If you find a minor scrape, cut, or break in the skin on your feet, keep it and the skin around it clean and dry. You may clean these areas with mild soap and water. Do not clean the area with peroxide, alcohol, or iodine.  If you have a wound, scrape, corn, or callus on your foot, look at it several times a day to make sure it is healing and not infected. Check for: ? Redness, swelling, or pain. ? Fluid or blood. ? Warmth. ? Pus or a bad smell. General instructions  Do not cross your legs. This may decrease blood flow to your feet.  Do not use heating pads or hot water bottles on your feet. They may burn your skin. If you have lost feeling in your feet or legs, you may not know this is happening until it is too late.  Protect your feet from hot and cold by wearing shoes, such as at the beach or on hot pavement.  Schedule a complete foot exam at least once a year (annually) or more often if you have foot problems. If you have foot problems, report any cuts, sores, or bruises to your health care provider immediately. Contact a health care  provider if:  You have a medical condition that increases your risk of infection and you have any cuts, sores, or bruises on your feet.  You have an injury that is not healing.  You have redness on your legs or feet.  You feel burning or tingling in your legs or feet.  You have pain or cramps in your legs and feet.  Your legs or feet are numb.  Your feet always feel cold.  You have pain around a toenail. Get help right away if:  You have a wound, scrape, corn, or callus on your foot and: ? You have pain, swelling, or redness that gets worse. ? You have fluid or blood coming from the wound, scrape, corn, or callus. ? Your wound, scrape, corn, or callus feels warm to the touch. ? You  have pus or a bad smell coming from the wound, scrape, corn, or callus. ? You have a fever. ? You have a red line going up your leg. Summary  Check your feet every day for cuts, sores, red spots, swelling, and blisters.  Moisturize feet and legs daily.  Wear shoes that fit properly and have enough cushioning.  If you have foot problems, report any cuts, sores, or bruises to your health care provider immediately.  Schedule a complete foot exam at least once a year (annually) or more often if you have foot problems. This information is not intended to replace advice given to you by your health care provider. Make sure you discuss any questions you have with your health care provider. Document Revised: 07/05/2019 Document Reviewed: 11/13/2016 Elsevier Patient Education  [employer].

## 2020-08-06 ENCOUNTER — Encounter: Payer: Self-pay | Admitting: Podiatry

## 2020-08-26 ENCOUNTER — Telehealth: Payer: Self-pay | Admitting: Cardiology

## 2020-08-26 NOTE — Telephone Encounter (Signed)
Notified pt's wife (okay per DPR) that 2-week supply of Eliquis will be left at check-in for pt to pick up. [name] verbalizes understanding.

## 2020-08-26 NOTE — Telephone Encounter (Signed)
Patient calling the office for samples of medication: ° ° °1.  What medication and dosage are you requesting samples for? apixaban (ELIQUIS) 5 MG TABS tablet ° °2.  Are you currently out of this medication? no ° ° °

## 2020-10-08 ENCOUNTER — Encounter: Payer: Self-pay | Admitting: Podiatry

## 2020-11-07 ENCOUNTER — Ambulatory Visit: Payer: Medicare HMO | Admitting: Podiatry

## 2020-11-08 ENCOUNTER — Ambulatory Visit: Payer: Medicare HMO | Admitting: Podiatry

## 2020-11-29 NOTE — Progress Notes (Signed)
Ander Gaster Date of Birth: 1950-11-25 Medical Record [id number]  History of Present Illness: Mr. Warr is seen for follow up CHF.  He has a history of systolic CHF with EF of 0000000 in May 2012. Coronary anatomy was normal by cath. He has a bicuspid AV with moderate AS/AI. He had moderate pulmonary HTN. He was treated medically and one of my old notes stated that his LV function showed marked improvement with  Echo in 2011 showing improvement in EF to 55-60%.   Unfortunately , he was lost to follow up and was not taking medication for over 2 years. He also has a history of HTN, hyperlipidemia, and PAD s/p right fem-pop BPG.   When seen in October 2015 he had evidence of recurrent CHF with severe HTN. Echo showed an EF of 20-25% with moderate AS/AI. He was started back on medication with lasix, carvedilol, and quinapril. He did not come back for follow up as instructed. He was seen in Jan. 2016 for surgical clearance for vascular surgery.  He was seen by [name] for claudication and was found to have severe PAD. Revascularization recommended. He underwent aortobifemoral BPG in February 2016. Was seen by me in April 2019- had missed multiple appointments. He did have an Echo in July 2018 showing normalization of LV function with EF 55%.  He is  diabetic and on insulin.  He was admitted in January 2020 with hypotension and shock. Had malaise, N/V and sweats. Treated with antibiotics. Had respiratory failure with hypoxia. Sent home with oxygen. He had Afib initially then converted to NSR. Started on Eliquis. Continued on Coreg. Had AKI with creatinine up to 2.7 that improved to 1.03. LFTs were elevated. Echo showed EF 40-45%. There was RV dysfunction with moderate pulmonary HTN. Moderate AI with mild AS. CT negative for PE but showed advanced emphysema.   He presented in June 2020 with progressive right leg pain and nonhealing wound on his foot. Angiography showed Occluded graft to the right leg  and only peroneal run off. He underwent the following surgery on 04/24/19 by Dr Donnetta Hutching. Right femoral and repair with interposition 8 mm Hemashield graft from prior right limb of aortofemoral bypass into into deep femoral artery, #2 right femoral to peroneal bypass with translocated non-reversed great saphenous vein harvested from the left leg. He did not follow up with vascular surgery or cardiology post surgery. Has only followed up with pulmonary for COPD with hypoxemia and pulmonary HTN.   He was admitted 2/28-3/18/21 with abdominal pain, N/V, and diarrhea. He had acute renal failure and lactic acidosis of unclear etiology. He required mechanical ventilation for one day and intermittent dialysis but was not on dialysis at discharge. His acidosis cleared. He had encephalopathy. Cranial CT and MRI were negative. CT abd/pelvis was negative. He was switched from Eliquis to Coumadin at DC due to AKI. Lasix was held.  He was readmitted 3/27-3/31/21 with acute CHF. He responded to IV diuresis. He was DC on lasix 20 mg bid. DC creatinine was 2.52.   He was admitted again 4/26-4/30/21 with acute hyperglycemic hyperosmolar state.  Last creatinine 1.74. lasix was discontinued at discharge. He was seen by me in September 2021 and was doing well at that time.   On follow up today he is doing very well. Reports compliance with medication. No smoking. Denies any chest pain, dyspnea, edema, palpitations or claudication. Had lab work done by primary care end of Dec. Hasn't seen [facility] since June 2020.  Allergies as of 12/02/2020   No Known Allergies     Medication List       Accurate as of December 02, 2020  3:43 PM. If you have any questions, ask your nurse or doctor.        STOP taking these medications   Anoro Ellipta 62.5-25 MCG/INH Aepb Generic drug: umeclidinium-vilanterol Stopped by: Jaxston Chohan [place], MD   levETIRAcetam 500 MG tablet Commonly known as: KEPPRA Stopped by: Clement Deneault [place], MD     TAKE  these medications   Accu-Chek FastClix Lancets Misc   Accu-Chek Guide test strip Generic drug: glucose blood one strip (1 each dose) by Other route 2 (two) times daily. Test blood glucose 2 times a day.   albuterol (2.5 MG/3ML) 0.083% nebulizer solution Commonly known as: PROVENTIL Take 3 mLs (2.5 mg total) by nebulization every 6 (six) hours as needed for wheezing or shortness of breath.   amLODipine 5 MG tablet Commonly known as: NORVASC TAKE 1 TABLET EVERY DAY   apixaban 5 MG Tabs tablet Commonly known as: ELIQUIS Take by mouth.   carvedilol 6.25 MG tablet Commonly known as: COREG Take 1 tablet (6.25 mg total) by mouth 2 (two) times daily with a meal.   Durezol 0.05 % Emul Generic drug: Difluprednate   guaiFENesin 600 MG 12 hr tablet Commonly known as: MUCINEX Take 2 tablets (1,200 mg total) by mouth 2 (two) times daily. What changed: how much to take   insulin aspart 100 UNIT/ML FlexPen Commonly known as: NOVOLOG Inject 3 Units into the skin 3 (three) times daily with meals.   insulin detemir 100 unit/ml Soln Commonly known as: LEVEMIR Inject 0.25 mLs (25 Units total) into the skin daily.   Insulin Pen Needle 32G X 4 MM Misc Use as directed with insulin pen   multivitamin with minerals Tabs tablet Take 1 tablet by mouth daily.   OXYGEN Inhale 4.5 L into the lungs continuous.   rosuvastatin 10 MG tablet Commonly known as: CRESTOR Take 1 tablet (10 mg total) by mouth daily at 6 PM. What changed: when to take this   Trelegy Ellipta 100-62.5-25 MCG/INH Aepb Generic drug: Fluticasone-Umeclidin-Vilant Inhale 1 puff into the lungs daily.        No Known Allergies  Past Medical History:  Diagnosis Date  . Aortic insufficiency    MODERATE WITH A BICUSPID AORTIC VAVLE  . Arterial occlusive disease    MULTILEVEL  . CHF (congestive heart failure) ([place])   . COPD (chronic obstructive pulmonary disease) ([place])   . Dilated cardiomyopathy (H[facility])    WITH  EJECTION FRACTION DOWN TO 20-25%--WITH CONGESTIVE HEART FAILURE  . Edema    LOWER EXTREMETIES  . Hyperglycemia 01/2020  . Hypertension   . Normal coronary arteries 2009  . Orthopnea   . Peripheral arterial disease ([place])   . Pulmonary hypertension ([place])   . SOB (shortness of breath)     Past Surgical History:  Procedure Laterality Date  . ABDOMINAL AORTAGRAM N/A 11/05/2014   Procedure: ABDOMINAL Maxcine Ham;  Surgeon: Angelia Mould, MD;  Location: [facility] CATH LAB;  Service: Cardiovascular;  Laterality: N/A;  . ABDOMINAL AORTOGRAM W/LOWER EXTREMITY Bilateral 04/21/2019   Procedure: ABDOMINAL AORTOGRAM W/LOWER EXTREMITY;  Surgeon: Angelia Mould, MD;  Location: [place] CV LAB;  Service: Cardiovascular;  Laterality: Bilateral;  . AORTA - BILATERAL FEMORAL ARTERY BYPASS GRAFT N/A 12/18/2014   Procedure: AORTOBIFEMORAL BYPASS GRAFT;  Surgeon: Angelia Mould, MD;  Location: [place];  Service: Vascular;  Laterality:  N/A;  . CARDIAC CATHETERIZATION  2009   Nl Cors, EF 20%  . COLONOSCOPY    . ENDARTERECTOMY FEMORAL Left 12/18/2014   Procedure: ENDARTERECTOMY FEMORAL;  Surgeon: Angelia Mould, MD;  Location: [place];  Service: Vascular;  Laterality: Left;  . FALSE ANEURYSM REPAIR Right 04/24/2019   Procedure: REPAIR OF RIGHT FEMORAL ARTERY PSEUDOANEURYSM;  Surgeon: Rosetta Posner, MD;  Location: [place];  Service: Vascular;  Laterality: Right;  . FEMORAL-POPLITEAL BYPASS GRAFT  02/21/2011   right  . FEMORAL-TIBIAL BYPASS GRAFT Right 04/24/2019   Procedure: REDO BYPASS GRAFT RIGHT FEMORAL-TIBIAL ARTERY USING LEFT LEG VEIN & HEMASHIELD GOLD 70m GRAFT;  Surgeon: ERosetta Posner MD;  [facility]  Service: Vascular;  Laterality: Right;  . IR FLUORO GUIDE CV LINE RIGHT  12/28/2019  . IR REMOVAL TUN CV CATH W/O FL  01/11/2020  . IR [place] VASC ACCESS RIGHT  12/28/2019  . OTHER SURGICAL HISTORY     POST RIGHT FEMOROPOPLITEAL BYPASS GRAFT  . PERIPHERAL VASCULAR CATHETERIZATION   11/05/2014   Procedure: LOWER EXTREMITY ANGIOGRAPHY;  Surgeon: CAngelia Mould MD;  [facility];  Service: Cardiovascular;;  . VEIN HARVEST Left 04/24/2019   Procedure: LEFT LEG SAPHENOUS VEIN HARVEST;  Surgeon: ERosetta Posner MD;  Location: [facility];  Service: Vascular;  Laterality: Left;    Social History   Socioeconomic History  . Marital status: Married    Spouse name: Not on file  . Number of children: 1  . Years of education: Not on file  . Highest education level: Not on file  Occupational History  . 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 Smoking status: Former Smoker    Packs/day: 1.00    Years: 40.00    Pack years: 40.00    Types: Cigarettes    Quit date: 10/15/2018    Years since quitting: 2.1  . Smokeless tobacco: Never Used  . Tobacco comment: quit 2010 and quit again 2019  Vaping Use  . Vaping Use: Never used  Substance and Sexual Activity  . Alcohol use: No    Alcohol/week: 0.0 standard drinks  . Drug use: No  . Sexual activity: Not on file  Other Topics Concern  . Not on file  Social History Narrative  . Not on file   Social Determinants of Health   Financial Resource Strain: Not on file  Food Insecurity: Not on file  Transportation Needs: Not on file  Physical Activity: Not on file  Stress: Not on file  Social Connections: Not on file    Family History  Problem Relation Age of Onset  . Diabetes Mother   . Hyperlipidemia Sister   . Hypertension Sister     Review of Systems: As noted in HPI.   All other systems were reviewed and are negative.  Physical Exam: BP 138/80   Pulse 64   Ht '5\' 11"'$  (1.803 m)   Wt 165 lb 6.4 oz (75 kg)   BMI 23.07 kg/m  Filed Weights   12/02/20 1522  Weight: 165 lb 6.4 oz (75 kg)    GENERAL:  Well appearing, thin BM in NAD HEENT:  PERRL, EOMI, sclera are clear. Oropharynx is clear. NECK:  No jugular venous distention, carotid upstroke brisk and symmetric, no bruits, no  thyromegaly or adenopathy LUNGS:  Clear to auscultation bilaterally CHEST:  Unremarkable HEART:  RRR,  PMI not displaced or sustained,S1 and S2 within normal limits, no  S3, no S4: no clicks, no rubs, gr 1/6 systolic and diastolic murmur RUSB.  ABD:  Soft, nontender. BS +, no masses or bruits. No hepatomegaly, no splenomegaly EXT:  Pedal pulses are weak.   no edema, no cyanosis no clubbing SKIN:  Warm and dry.  No rashes NEURO:  Alert and oriented x 3. Cranial nerves II through XII intact. PSYCH:  Cognitively intact   LABORATORY DATA: . Lab Results  Component Value Date   WBC 7.5 02/23/2020   HGB 11.0 (L) 02/23/2020   HCT 34.6 (L) 02/23/2020   PLT 141 (L) 02/23/2020   GLUCOSE 80 02/23/2020   CHOL 166 11/30/2019   TRIG 137 11/30/2019   HDL 41 11/30/2019   LDLCALC 101 (H) 11/30/2019   ALT 25 02/19/2020   AST 16 02/19/2020   NA 139 02/23/2020   K 4.7 02/23/2020   CL 103 02/23/2020   CREATININE 1.74 (H) 02/23/2020   BUN 39 (H) 02/23/2020   CO2 28 02/23/2020   TSH 3.244 08/20/2014   INR 1.9 (H) 02/23/2020   HGBA1C 8.0 (H) 12/24/2019    Labs dated 07/28/17: BUN 29, creatinine 1.03. Other chemistries normal. A1c 6.2%. Cholesterol 137, Triglycerides 63, HDL 43, LDL 81, TSH normal. Labs dated 12/21/17: cholesterol 123, triglycerides 64, HDL 41, LDL 69. A1c 6.3%. CMET and CBC normal. TSH normal. Dated 10/22/20: A1c 6.7%. creatinine 1.41, cholesterol 149, triglycerides 170, HDL 39, LDL 81. TSH, CBC, other chemistries normal.   Ecg today shows NSR with rate 64, LVH. I have personally reviewed and interpreted this study.   Echo: 05/06/17:  Study Conclusions  - Left ventricle: The cavity size was normal. Wall thickness was   increased in a pattern of mild LVH. Systolic function was normal.   The estimated ejection fraction was in the range of 50% to 55%.   Wall motion was normal; there were no regional wall motion   abnormalities. Doppler parameters are consistent with abnormal    left ventricular relaxation (grade 1 diastolic dysfunction). - Aortic valve: There was mild stenosis. There was mild to moderate   regurgitation. - Mitral valve: Calcified annulus. - Left atrium: The atrium was mildly dilated. - Right ventricle: The cavity size was mildly dilated. - Right atrium: The atrium was mildly dilated. - Pulmonary arteries: Systolic pressure was moderately increased.  Impressions:  - Normal LV systolic function; mild LVH; mild diastolic   dysfunction; calcified aortic valve with mild AS (mean gradient   18 mmHg) and mild to moderate AI; mild LAE; mild RAE and RVE;   trace TR with moderately elevated pulmonary pressure.  Echo 11/21/18: Study Conclusions  - Left ventricle: The cavity size was normal. Systolic function was   mildly to moderately reduced. The estimated ejection fraction was   in the range of 40% to 45%. Diffuse hypokinesis. The study is not   technically sufficient to allow evaluation of LV diastolic   function. - Ventricular septum: The contour showed diastolic flattening. - Aortic valve: Valve mobility was restricted. There was mild   stenosis. There was moderate regurgitation. - Right ventricle: The cavity size was moderately dilated. Wall   thickness was normal. Systolic function was moderately reduced. - Right atrium: The atrium was severely dilated. - Tricuspid valve: There was moderate regurgitation. - Pulmonary arteries: Systolic pressure was moderately increased.   PA peak pressure: 53 mm Hg (S).  Impressions:  - Compared to the prior study, there has been no significant   interval change.  Echo 12/13/19: IMPRESSIONS  1. Severe concentric LVH, consider evaluation for infiltrative  cardiomyopathies such as cardiac amyloidosis.  2. Impaired GLS: -14.6%. Left ventricular ejection fraction, by  estimation, is 65 to 70%. The left ventricle has normal function. The left  ventricle has no regional wall motion abnormalities.  There is severe  concentric left ventricular hypertrophy. Left  ventricular diastolic parameters are consistent with Grade I diastolic  dysfunction (impaired relaxation).  3. Right ventricular systolic function is normal. The right ventricular  size is normal. There is moderately elevated pulmonary artery systolic  pressure. The estimated right ventricular systolic pressure is [id number] mmHg.  4. The mitral valve is normal in structure and function. No evidence of  mitral valve regurgitation. No evidence of mitral stenosis.  5. The aortic valve is normal in structure and function. Aortic valve  regurgitation is moderate. Mild to moderate aortic valve stenosis.  6. The inferior vena cava is normal in size with greater than 50%  respiratory variability, suggesting right atrial pressure of 3 mmHg.    Assessment / Plan: 1. Chronic systolic CHF.  He has a history of cardiomyopathy- nonischemic. EF normalized in past when compliant with medication. Most recent Echo in 2021 showed improved EF to normal with severe LVH. Reinforced importance of compliance with medical therapy. Low sodium diet. Continue  Coreg, amlodipine. Appears to be euvolemic. Avoid diuretics for now. . Avoid ARB/ACEi due to CKD   2. HTN - BP is well controlled today.  3. PAD s/p redo right fem-pop bypass last June 2020. S/p aortobifemoral BPG Feb. 2016. Needs follow up with [facility].   4. Hyperlipidemia. on Crestor 10 mg daily. Enforce compliance. LDL 81 close to goal   5. Biscuspid AV with mild AS/moderate AI. Will monitor.   6. Tobacco abuse- quit   7. DM now on insulin per primary care. A1c 6.7%.   8. Advanced emphysema. On home oxygen intermittently. On Trelegy  9. Afib in setting of sepsis. In NSR now. Continue Eliquis  10. CKD stage 3a  Follow up in 6 months.

## 2020-12-02 ENCOUNTER — Other Ambulatory Visit: Payer: Self-pay

## 2020-12-02 ENCOUNTER — Ambulatory Visit: Payer: Medicare HMO | Admitting: Cardiology

## 2020-12-02 ENCOUNTER — Encounter: Payer: Self-pay | Admitting: Cardiology

## 2020-12-02 VITALS — BP 138/80 | HR 64 | Ht 71.0 in | Wt 165.4 lb

## 2020-12-02 DIAGNOSIS — I739 Peripheral vascular disease, unspecified: Secondary | ICD-10-CM

## 2020-12-02 DIAGNOSIS — I5022 Chronic systolic (congestive) heart failure: Secondary | ICD-10-CM

## 2020-12-02 DIAGNOSIS — I110 Hypertensive heart disease with heart failure: Secondary | ICD-10-CM

## 2020-12-02 DIAGNOSIS — N1831 Chronic kidney disease, stage 3a: Secondary | ICD-10-CM

## 2020-12-02 DIAGNOSIS — I10 Essential (primary) hypertension: Secondary | ICD-10-CM | POA: Diagnosis not present

## 2020-12-03 ENCOUNTER — Encounter: Payer: Self-pay | Admitting: Podiatry

## 2020-12-03 ENCOUNTER — Ambulatory Visit (INDEPENDENT_AMBULATORY_CARE_PROVIDER_SITE_OTHER): Payer: Medicare HMO | Admitting: Podiatry

## 2020-12-03 DIAGNOSIS — L84 Corns and callosities: Secondary | ICD-10-CM

## 2020-12-03 DIAGNOSIS — B351 Tinea unguium: Secondary | ICD-10-CM

## 2020-12-03 DIAGNOSIS — M2042 Other hammer toe(s) (acquired), left foot: Secondary | ICD-10-CM

## 2020-12-03 DIAGNOSIS — M79674 Pain in right toe(s): Secondary | ICD-10-CM | POA: Diagnosis not present

## 2020-12-03 DIAGNOSIS — M79675 Pain in left toe(s): Secondary | ICD-10-CM

## 2020-12-03 DIAGNOSIS — E1142 Type 2 diabetes mellitus with diabetic polyneuropathy: Secondary | ICD-10-CM | POA: Diagnosis not present

## 2020-12-03 DIAGNOSIS — M2041 Other hammer toe(s) (acquired), right foot: Secondary | ICD-10-CM

## 2020-12-08 NOTE — Progress Notes (Signed)
Subjective:  Patient ID: Kyle [name], male    DOB: 1951/06/27,  MRN: [id number]  70 y.o. male presents with at risk foot care. Pt has h/o NIDDM with PAD and callus(es) b/l and painful thick toenails that are difficult to trim. Painful toenails interfere with ambulation. Aggravating factors include wearing enclosed shoe gear. Pain is relieved with periodic professional debridement. Painful calluses are aggravated when weightbearing with and without shoegear. Pain is relieved with periodic professional debridement..    Patient's blood sugar was 97 mg/dl this morning.  PCP: Alonna Buckler, MD and last visit was: 10/22/2020.  Review of Systems: Negative except as noted in the HPI.    Current Outpatient Medications:  .  ACCU-CHEK FASTCLIX LANCETS MISC, , Disp: , Rfl:  .  albuterol (PROVENTIL) (2.5 MG/3ML) 0.083% nebulizer solution, Take 3 mLs (2.5 mg total) by nebulization every 6 (six) hours as needed for wheezing or shortness of breath., Disp: 75 mL, Rfl: 3 .  amLODipine (NORVASC) 5 MG tablet, TAKE 1 TABLET EVERY DAY, Disp: 90 tablet, Rfl: 2 .  apixaban (ELIQUIS) 5 MG TABS tablet, Take by mouth., Disp: , Rfl:  .  carvedilol (COREG) 6.25 MG tablet, Take 1 tablet (6.25 mg total) by mouth 2 (two) times daily with a meal., Disp: 60 tablet, Rfl: 1 .  DUREZOL 0.05 % EMUL, , Disp: , Rfl:  .  Fluticasone-Umeclidin-Vilant (TRELEGY ELLIPTA) 100-62.5-25 MCG/INH AEPB, Inhale 1 puff into the lungs daily., Disp: 60 each, Rfl: 5 .  glucose blood (ACCU-CHEK GUIDE) test strip, one strip (1 each dose) by Other route 2 (two) times daily. Test blood glucose 2 times a day., Disp: , Rfl:  .  guaiFENesin (MUCINEX) 600 MG 12 hr tablet, Take 2 tablets (1,200 mg total) by mouth 2 (two) times daily. (Patient taking differently: Take 600 mg by mouth 2 (two) times daily.), Disp: 30 tablet, Rfl: 0 .  insulin aspart (NOVOLOG) 100 UNIT/ML FlexPen, Inject 3 Units into the skin 3 (three) times daily with meals., Disp: 15  mL, Rfl: 0 .  insulin detemir (LEVEMIR) 100 unit/ml SOLN, Inject 0.25 mLs (25 Units total) into the skin daily., Disp: 8 mL, Rfl: 0 .  Insulin Pen Needle 32G X 4 MM MISC, Use as directed with insulin pen, Disp: , Rfl:  .  Multiple Vitamin (MULTIVITAMIN WITH MINERALS) TABS tablet, Take 1 tablet by mouth daily., Disp: , Rfl:  .  OXYGEN, Inhale 4.5 L into the lungs continuous., Disp: , Rfl:  .  rosuvastatin (CRESTOR) 10 MG tablet, Take 1 tablet (10 mg total) by mouth daily at 6 PM. (Patient taking differently: Take 10 mg by mouth daily.), Disp: 90 tablet, Rfl: 0 No Known Allergies  Objective:  There were no vitals filed for this visit. Constitutional Patient is a pleasant 70 y.o. African American male WD, WN in NAD. AAO x 3.  Vascular Capillary fill time to digits <3 seconds b/l lower extremities. Palpable pedal pulses b/l LE. Pedal hair absent. Lower extremity skin temperature gradient within normal limits. No pain with calf compression b/l. No cyanosis or clubbing noted.  Neurologic Normal speech. Protective sensation diminished with 10g monofilament b/l. Vibratory sensation diminished b/l.  Dermatologic Pedal skin with normal turgor, texture and tone bilaterally. No open wounds bilaterally. No interdigital macerations bilaterally. Toenails 1-5 b/l elongated, discolored, dystrophic, thickened, crumbly with subungual debris and tenderness to dorsal palpation. Hyperkeratotic lesion(s) submet head 1 left foot, submet head 1 right foot, submet head 5 left foot and submet head  5 right foot.  No erythema, no edema, no drainage, no fluctuance.  Orthopedic: Normal muscle strength 5/5 to all lower extremity muscle groups bilaterally. No pain crepitus or joint limitation noted with ROM b/l. Hammertoes noted to the 2-5 left and 1-5 right.   Hemoglobin A1C Latest Ref Rng & Units 12/24/2019  HGBA1C 4.8 - 5.6 % 8.0(H)  Some recent data might be hidden       Assessment:   1. Pain due to onychomycosis of  toenails of both feet   2. Callus   3. Acquired hammertoes of both feet   4. Diabetic peripheral neuropathy associated with type 2 diabetes mellitus ([place])    Plan:  Patient was evaluated and treated and all questions answered.  Onychomycosis with pain -Nails palliatively debridement as below. -Educated on self-care  Procedure: Nail Debridement Rationale: Pain Type of Debridement: manual, sharp debridement. Instrumentation: Nail nipper, rotary burr. Number of Nails: 10  -Examined patient. -Patient to continue soft, supportive shoe gear daily. -Toenails 1-5 b/l were debrided in length and girth with sterile nail nippers and dremel without iatrogenic bleeding.  -Callus(es) submet head 1 left foot, submet head 1 right foot, submet head 5 left foot and submet head 5 right foot pared utilizing sterile scalpel blade without complication or incident. Total number debrided =4. -Patient to report any pedal injuries to medical professional immediately. -Patient/POA to call should there be question/concern in the interim.  Return in about 3 months (around 03/02/2021).  Marzetta Board, DPM

## 2020-12-09 ENCOUNTER — Other Ambulatory Visit: Payer: Self-pay | Admitting: Pulmonary Disease

## 2020-12-14 ENCOUNTER — Other Ambulatory Visit: Payer: Self-pay | Admitting: Pulmonary Disease

## 2021-01-04 ENCOUNTER — Other Ambulatory Visit: Payer: Self-pay | Admitting: Cardiology

## 2021-01-25 ENCOUNTER — Other Ambulatory Visit: Payer: Self-pay

## 2021-01-25 DIAGNOSIS — I70229 Atherosclerosis of native arteries of extremities with rest pain, unspecified extremity: Secondary | ICD-10-CM

## 2021-01-25 NOTE — Addendum Note (Signed)
Addended byDoylene Bode on: 01/25/2021 12:17 PM   Modules accepted: Orders

## 2021-01-25 NOTE — Progress Notes (Signed)
Error

## 2021-01-29 ENCOUNTER — Telehealth: Payer: Self-pay

## 2021-01-29 NOTE — Telephone Encounter (Signed)
Patient called in- needing eliquis 5 mg samples.  Advised that price was increased to $180 and they can not afford this. I did advise I could give samples and information for patient assistance.   Medication given: Eliquis 5 mg Lot #: [id number] Expiration: July 2024 Qty: 2 boxes

## 2021-01-30 ENCOUNTER — Other Ambulatory Visit: Payer: Self-pay | Admitting: Cardiology

## 2021-02-05 ENCOUNTER — Other Ambulatory Visit: Payer: Self-pay | Admitting: Vascular Surgery

## 2021-02-05 ENCOUNTER — Ambulatory Visit (HOSPITAL_COMMUNITY): Admission: RE | Admit: 2021-02-05 | Payer: Medicare HMO | Source: Ambulatory Visit

## 2021-02-05 ENCOUNTER — Ambulatory Visit: Payer: Medicare HMO | Admitting: Vascular Surgery

## 2021-02-05 ENCOUNTER — Encounter (HOSPITAL_COMMUNITY): Payer: Medicare HMO

## 2021-02-05 DIAGNOSIS — I70229 Atherosclerosis of native arteries of extremities with rest pain, unspecified extremity: Secondary | ICD-10-CM

## 2021-02-27 ENCOUNTER — Other Ambulatory Visit: Payer: Self-pay | Admitting: Pulmonary Disease

## 2021-03-02 ENCOUNTER — Other Ambulatory Visit: Payer: Self-pay | Admitting: Pulmonary Disease

## 2021-03-03 ENCOUNTER — Other Ambulatory Visit: Payer: Self-pay | Admitting: Pulmonary Disease

## 2021-03-04 ENCOUNTER — Telehealth: Payer: Self-pay | Admitting: Pulmonary Disease

## 2021-03-04 MED ORDER — TRELEGY ELLIPTA 100-62.5-25 MCG/INH IN AEPB: 1.0000 | INHALATION_SPRAY | Freq: Every day | RESPIRATORY_TRACT | 1 refills | Status: DC

## 2021-03-04 MED ORDER — TRELEGY ELLIPTA 100-62.5-25 MCG/INH IN AEPB: 1.0000 | INHALATION_SPRAY | Freq: Every day | RESPIRATORY_TRACT | 0 refills | Status: DC

## 2021-03-04 NOTE — Telephone Encounter (Signed)
Called and spoke with Patient's Wife, Lucita Ferrara ([place]).  Lucita Ferrara stated patient is scheduled with Tammy, NP 03/06/11, but is needing a Trelegy refill  sent to pharmacy for Patient to pick up later this week.  Lois requested a Trelegy sample until Patient can pick up prescription.  Sample placed at front desk for pick up and Trelegy prescription sent in to requested pharmacy.  Nothing further at this time.

## 2021-03-05 ENCOUNTER — Encounter: Payer: Self-pay | Admitting: Adult Health

## 2021-03-05 ENCOUNTER — Ambulatory Visit (INDEPENDENT_AMBULATORY_CARE_PROVIDER_SITE_OTHER): Payer: Medicare HMO | Admitting: Vascular Surgery

## 2021-03-05 ENCOUNTER — Other Ambulatory Visit: Payer: Self-pay

## 2021-03-05 ENCOUNTER — Encounter: Payer: Self-pay | Admitting: Vascular Surgery

## 2021-03-05 ENCOUNTER — Ambulatory Visit (INDEPENDENT_AMBULATORY_CARE_PROVIDER_SITE_OTHER)
Admission: RE | Admit: 2021-03-05 | Discharge: 2021-03-05 | Disposition: A | Discharge status: Home / Self Care | Payer: Medicare HMO | Source: Ambulatory Visit | Attending: Vascular Surgery | Admitting: Vascular Surgery

## 2021-03-05 ENCOUNTER — Ambulatory Visit: Payer: Medicare HMO | Admitting: Adult Health

## 2021-03-05 ENCOUNTER — Ambulatory Visit (HOSPITAL_COMMUNITY)
Admission: RE | Admit: 2021-03-05 | Discharge: 2021-03-05 | Disposition: A | Discharge status: Home / Self Care | Payer: Medicare HMO | Source: Ambulatory Visit | Attending: Vascular Surgery | Admitting: Vascular Surgery

## 2021-03-05 VITALS — BP 191/106 | HR 78 | Temp 98.3°F | Resp 20 | Ht 70.0 in | Wt 160.0 lb

## 2021-03-05 DIAGNOSIS — I5022 Chronic systolic (congestive) heart failure: Secondary | ICD-10-CM

## 2021-03-05 DIAGNOSIS — I739 Peripheral vascular disease, unspecified: Secondary | ICD-10-CM

## 2021-03-05 DIAGNOSIS — I70229 Atherosclerosis of native arteries of extremities with rest pain, unspecified extremity: Secondary | ICD-10-CM

## 2021-03-05 DIAGNOSIS — J9611 Chronic respiratory failure with hypoxia: Secondary | ICD-10-CM | POA: Diagnosis not present

## 2021-03-05 DIAGNOSIS — J449 Chronic obstructive pulmonary disease, unspecified: Secondary | ICD-10-CM

## 2021-03-05 MED ORDER — TRELEGY ELLIPTA 100-62.5-25 MCG/INH IN AEPB: 1.0000 | INHALATION_SPRAY | Freq: Every day | RESPIRATORY_TRACT | 5 refills | Status: DC

## 2021-03-05 NOTE — Patient Instructions (Addendum)
Continue on TRELEGY 1 puff daily, rinse after use.  Yearly CT Chest .  Activity as tolerated.  Please wear your oxygen 4l/m .  Follow up with Dr. Elsworth Soho  In 6 months and As needed

## 2021-03-05 NOTE — Progress Notes (Signed)
$'@Patient'a$  ID: Kyle [name], male    DOB: 09-24-1951, 70 y.o.   MRN: [id number]  Chief Complaint  Patient presents with  . Follow-up    Referring provider: Alonna Buckler, MD  HPI: 70 year old male former smoker followed for severe COPD with emphysema, and chronic respiratory failure on oxygen Medical history significant for cardiomyopathy  TEST/EVENTS :  11/26/2018-CTA chest-negative for PE, trace pleural effusions, atelectasis in bases right greater than left  11/20/2018-CT head without contrast-sinuses normal  12/16/2018-spirometry-FVC 1.7 (43% predicted), ratio 64, FEV1 1.1 (36% predicted), consistent with severe airway obstruction  11/21/2018-echocardiogram-LV ejection fraction 40 to 45%, diffuse hypokinesis, right ventricle moderately dilated, systolic function was moderately reduced, PA P pressure 53  03/05/2021 Follow up ; COPD , O2 RF  Patient presents for a 1 year follow-up.  Patient has underlying severe COPD and oxygen dependent respiratory failure. Patient remains on Trelegy.  Says overall breathing has been doing more stable over the last year.  He has not been on any antibiotics or steroids and has had no hospitalizations.  He denies any increased cough or wheezing.  Says he tries to be active but gets winded if he does heavy activities. He denies any hemoptysis, unintentional weight loss chest pain orthopnea PND or increased leg swelling. He does participate in the yearly low-dose CT screening program.  Has an upcoming CT later this month.  CT from May 2021 showed lung RADS 2.  Moderate to advanced changes of emphysema.  And tiny nodule in the left base at 4.7 mm. Patient is supposed to be on oxygen 4 L.  He has a portable oxygen concentrator that he can use it 4-5 with walking.  Patient says that he has not been using this.  Today in the office walk test shows that he continues to desaturate with walking.  At rest he is about 88 to 89% on room air.  Walking he drops  down to 82%.  Patient education on potential complications of hypoxemia.  And encouraged on oxygen compliance use.   No Known Allergies  Immunization History  Administered Date(s) Administered  . Fluad Quad(high Dose 65+) 07/14/2019  . Influenza-Unspecified 01/26/2017, 08/10/2017  . Moderna Sars-Covid-2 Vaccination 02/10/2020, 03/09/2020  . Pneumococcal Conjugate-13 01/20/2018  . Pneumococcal Polysaccharide-23 07/14/2019    Past Medical History:  Diagnosis Date  . Aortic insufficiency    MODERATE WITH A BICUSPID AORTIC VAVLE  . Arterial occlusive disease    MULTILEVEL  . CHF (congestive heart failure) ([place])   . COPD (chronic obstructive pulmonary disease) ([place])   . Dilated cardiomyopathy (HCC)    WITH EJECTION FRACTION DOWN TO 20-25%--WITH CONGESTIVE HEART FAILURE  . Edema    LOWER EXTREMETIES  . Hyperglycemia 01/2020  . Hypertension   . Normal coronary arteries 2009  . Orthopnea   . Peripheral arterial disease ([place])   . Pulmonary hypertension ([place])   . SOB (shortness of breath)     Tobacco History: Social History   Tobacco Use  Smoking Status Former Smoker  . Packs/day: 1.00  . Years: 40.00  . Pack years: 40.00  . Types: Cigarettes  . Quit date: 10/15/2018  . Years since quitting: 2.3  Smokeless Tobacco Never Used  Tobacco Comment   quit 2010 and quit again 2019   Counseling given: Not Answered Comment: quit 2010 and quit again 2019   Outpatient Medications Prior to Visit  Medication Sig Dispense Refill  . ACCU-CHEK FASTCLIX LANCETS MISC     . albuterol (PROVENTIL) (  2.5 MG/3ML) 0.083% nebulizer solution Take 3 mLs (2.5 mg total) by nebulization every 6 (six) hours as needed for wheezing or shortness of breath. 75 mL 3  . amLODipine (NORVASC) 5 MG tablet TAKE 1 TABLET EVERY DAY 90 tablet 3  . DUREZOL 0.05 % EMUL     . ELIQUIS 5 MG TABS tablet TAKE 1 TABLET TWICE DAILY 180 tablet 1  . Fluticasone-Umeclidin-Vilant (TRELEGY ELLIPTA) 100-62.5-25 MCG/INH AEPB  Inhale 1 puff into the lungs daily. 60 each 1  . glucose blood (ACCU-CHEK GUIDE) test strip one strip (1 each dose) by Other route 2 (two) times daily. Test blood glucose 2 times a day.    Marland Kitchen guaiFENesin (MUCINEX) 600 MG 12 hr tablet Take 2 tablets (1,200 mg total) by mouth 2 (two) times daily. (Patient taking differently: Take 600 mg by mouth 2 (two) times daily.) 30 tablet 0  . insulin aspart (NOVOLOG) 100 UNIT/ML FlexPen Inject 3 Units into the skin 3 (three) times daily with meals. 15 mL 0  . insulin detemir (LEVEMIR) 100 unit/ml SOLN Inject 0.25 mLs (25 Units total) into the skin daily. 8 mL 0  . Insulin Pen Needle 32G X 4 MM MISC Use as directed with insulin pen    . Multiple Vitamin (MULTIVITAMIN WITH MINERALS) TABS tablet Take 1 tablet by mouth daily.    . OXYGEN Inhale 4.5 L into the lungs continuous.    . rosuvastatin (CRESTOR) 10 MG tablet Take 1 tablet (10 mg total) by mouth daily at 6 PM. (Patient taking differently: Take 10 mg by mouth daily.) 90 tablet 0  . carvedilol (COREG) 6.25 MG tablet Take 1 tablet (6.25 mg total) by mouth 2 (two) times daily with a meal. 60 tablet 1  . Fluticasone-Umeclidin-Vilant (TRELEGY ELLIPTA) 100-62.5-25 MCG/INH AEPB Inhale 1 puff into the lungs daily. (Patient not taking: Reported on 03/05/2021) 14 each 0   No facility-administered medications prior to visit.     Review of Systems:   Constitutional:   No  weight loss, night sweats,  Fevers, chills,  +fatigue, or  lassitude.  HEENT:   No headaches,  Difficulty swallowing,  Tooth/dental problems, or  Sore throat,                No sneezing, itching, ear ache, nasal congestion, post nasal drip,   CV:  No chest pain,  Orthopnea, PND, swelling in lower extremities, anasarca, dizziness, palpitations, syncope.   GI  No heartburn, indigestion, abdominal pain, nausea, vomiting, diarrhea, change in bowel habits, loss of appetite, bloody stools.   Resp:    No excess mucus, no productive cough,  No  non-productive cough,  No coughing up of blood.  No change in color of mucus.  No wheezing.  No chest wall deformity  Skin: no rash or lesions.  GU: no dysuria, change in color of urine, no urgency or frequency.  No flank pain, no hematuria   MS:  No joint pain or swelling.  No decreased range of motion.  No back pain.    Physical Exam  BP (!) 158/78 (BP Location: Left Arm, Cuff Size: Normal)   Pulse 76   Temp (!) 97.4 F (36.3 C) (Temporal)   Ht '5\' 10"'$  (1.778 m)   Wt 161 lb 9.6 oz (73.3 kg)   SpO2 90%   BMI 23.19 kg/m   GEN: A/Ox3; pleasant , NAD, well nourished    HEENT:  [place]/AT,    NOSE-clear, THROAT-clear, no lesions, no postnasal drip or exudate noted.  NECK:  Supple w/ fair ROM; no JVD; normal carotid impulses w/o bruits; no thyromegaly or nodules palpated; no lymphadenopathy.    RESP  Clear  P & A; w/o, wheezes/ rales/ or rhonchi. no accessory muscle use, no dullness to percussion  CARD:  RRR, no m/r/g, no peripheral edema, pulses intact, no cyanosis or clubbing.  GI:   Soft & nt; nml bowel sounds; no organomegaly or masses detected.   Musco: Warm bil, no deformities or joint swelling noted.   Neuro: alert, no focal deficits noted.    Skin: Warm, no lesions or rashes    Lab Results:  BMET  Imaging: No results found.    No flowsheet data found.  No results found for: NITRICOXIDE      Assessment & Plan:   COPD with chronic bronchitis and emphysema ([place]) Currently stable on present regimen Continue with yearly low-dose CT screening program COVID-vaccine and pneumonia vaccines are up-to-date  Plan  Patient Instructions  Continue on TRELEGY 1 puff daily, rinse after use.  Yearly CT Chest .  Activity as tolerated.  Please wear your oxygen 4l/m .  Follow up with Dr. Elsworth Soho  In 6 months and As needed         Chronic respiratory failure with hypoxia [facility]) Patient is to continue on oxygen.  Patient education on importance of oxygen compliance.   Patient is continue on 4 L of oxygen.  Heart failure ([place]) Appears euvolemic on exam.  Continue on current regimen     Rexene Edison, NP 03/05/2021

## 2021-03-05 NOTE — Progress Notes (Signed)
REASON FOR VISIT:   Follow-up of peripheral vascular disease.  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  He appears to have a stenosis in the distal bypass graft and I think this could put him at risk for graft thrombosis.  This reason I recommended arteriography to further define the area of concern.  Given that he has an aortobifemoral bypass graft there probably would not be any good endovascular options.  He would like to discuss this with his wife before scheduling the procedure.  We have discussed the indications for the procedure and the potential complications.  He is on Eliquis and this would have to be held prior to the procedure.  Fortunately he is not a smoker.  He is on aspirin and is on a statin.   HPI:   Kyle [name] is a pleasant 70 y.o. male who underwent an aortofemoral bypass graft in 2016.  He has known severe infrainguinal arterial occlusive disease.  He had a previous right femoropopliteal bypass in 2010 which is chronically occluded.  When I saw him last in June 2020 he was having rest pain of the right foot.  He underwent an arteriogram and his only option for revascularization on the right where he had a wound on the foot was a thin peroneal artery bypass.  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 Donnetta Hutching.  It looks like he was then lost to follow-up.  Since I saw him last, he denies any history of claudication, rest pain, or nonhealing ulcers.  I believe he was recently seen by Dr. Martinique who encouraged him to continue with follow-up of his graft and he was referred  back to our office.  Past Medical History:  Diagnosis Date  . Aortic insufficiency    MODERATE WITH A BICUSPID AORTIC VAVLE  . Arterial occlusive disease    MULTILEVEL  . CHF (congestive heart failure) ([place])   . COPD (chronic obstructive pulmonary disease) ([place])   . Dilated cardiomyopathy (HCC)    WITH EJECTION FRACTION DOWN TO 20-25%--WITH CONGESTIVE HEART FAILURE  . Edema    LOWER EXTREMETIES  . Hyperglycemia 01/2020  . Hypertension   . Normal coronary arteries 2009  . Orthopnea   . Peripheral arterial disease ([place])   . Pulmonary hypertension ([place])   . SOB (shortness of breath)     Family History  Problem Relation Age of Onset  . Diabetes Mother   . Hyperlipidemia Sister   . Hypertension Sister     SOCIAL HISTORY: Social History   Tobacco Use  . Smoking status: Former Smoker    Packs/day: 1.00    Years: 40.00    Pack years: 40.00    Types: Cigarettes    Quit date: 10/15/2018    Years since quitting: 2.3  . Smokeless tobacco: Never Used  . Tobacco comment: quit 2010 and quit again 2019  Substance Use Topics  . Alcohol use: No    Alcohol/week: 0.0 standard drinks    No Known Allergies  Current Outpatient Medications  Medication Sig Dispense Refill  . ACCU-CHEK FASTCLIX LANCETS MISC     .  albuterol (PROVENTIL) (2.5 MG/3ML) 0.083% nebulizer solution Take 3 mLs (2.5 mg total) by nebulization every 6 (six) hours as needed for wheezing or shortness of breath. 75 mL 3  . amLODipine (NORVASC) 5 MG tablet TAKE 1 TABLET EVERY DAY 90 tablet 3  . DUREZOL 0.05 % EMUL     . ELIQUIS 5 MG TABS tablet TAKE 1 TABLET TWICE DAILY 180 tablet 1  . Fluticasone-Umeclidin-Vilant (TRELEGY ELLIPTA) 100-62.5-25 MCG/INH AEPB Inhale 1 puff into the lungs daily. 14 each 0  . Fluticasone-Umeclidin-Vilant (TRELEGY ELLIPTA) 100-62.5-25 MCG/INH AEPB Inhale 1 puff into the lungs daily. 60 each 5  . glucose blood (ACCU-CHEK GUIDE) test strip one strip (1 each dose) by Other route 2 (two) times  daily. Test blood glucose 2 times a day.    Marland Kitchen guaiFENesin (MUCINEX) 600 MG 12 hr tablet Take 2 tablets (1,200 mg total) by mouth 2 (two) times daily. (Patient taking differently: Take 600 mg by mouth 2 (two) times daily.) 30 tablet 0  . insulin aspart (NOVOLOG) 100 UNIT/ML FlexPen Inject 3 Units into the skin 3 (three) times daily with meals. 15 mL 0  . insulin detemir (LEVEMIR) 100 unit/ml SOLN Inject 0.25 mLs (25 Units total) into the skin daily. 8 mL 0  . Insulin Pen Needle 32G X 4 MM MISC Use as directed with insulin pen    . Multiple Vitamin (MULTIVITAMIN WITH MINERALS) TABS tablet Take 1 tablet by mouth daily.    . OXYGEN Inhale 4.5 L into the lungs continuous.    . rosuvastatin (CRESTOR) 10 MG tablet Take 1 tablet (10 mg total) by mouth daily at 6 PM. (Patient taking differently: Take 10 mg by mouth daily.) 90 tablet 0  . carvedilol (COREG) 6.25 MG tablet Take 1 tablet (6.25 mg total) by mouth 2 (two) times daily with a meal. 60 tablet 1   No current facility-administered medications for this visit.    REVIEW OF SYSTEMS:  '[X]'$  denotes positive finding, '[ ]'$  denotes negative finding Cardiac  Comments:  Chest pain or chest pressure:    Shortness of breath upon exertion:    Short of breath when lying flat:    Irregular heart rhythm:        Vascular    Pain in calf, thigh, or hip brought on by ambulation:    Pain in feet at night that wakes you up from your sleep:     Blood clot in your veins:    Leg swelling:         Pulmonary    Oxygen at home:    Productive cough:     Wheezing:         Neurologic    Sudden weakness in arms or legs:     Sudden numbness in arms or legs:     Sudden onset of difficulty speaking or slurred speech:    Temporary loss of vision in one eye:     Problems with dizziness:         Gastrointestinal    Blood in stool:     Vomited blood:         Genitourinary    Burning when urinating:     Blood in urine:        Psychiatric    Major depression:          Hematologic    Bleeding problems:    Problems with blood clotting too easily:        Skin    Rashes or  ulcers:        Constitutional    Fever or chills:     PHYSICAL EXAM:   Vitals:   03/05/21 1526  BP: (!) 191/106  Pulse: 78  Resp: 20  Temp: 98.3 F (36.8 C)  SpO2: 90%  Weight: 160 lb (72.6 kg)  Height: '5\' 10"'$  (1.778 m)    GENERAL: The patient is a well-nourished male, in no acute distress. The vital signs are documented above. CARDIAC: There is a regular rate and rhythm.  VASCULAR: I do not detect carotid bruits. He has palpable femoral pulses. I cannot palpate pedal pulses. PULMONARY: There is good air exchange bilaterally without wheezing or rales. ABDOMEN: Soft and non-tender with normal pitched bowel sounds.  MUSCULOSKELETAL: There are no major deformities or cyanosis. NEUROLOGIC: No focal weakness or paresthesias are detected. SKIN: There are no ulcers or rashes noted. PSYCHIATRIC: The patient has a normal affect.  DATA:    ARTERIAL DOPPLER STUDY: I have independently interpreted his arterial Doppler study today.  On the right side he has a monophasic dorsalis pedis and posterior tibial signal with an ABI of 80%.  Toe pressures 100 mmHg.  On the left side he has a monophasic dorsalis pedis and posterior tibial signal.  ABI is 49%.  Toe pressures 58 mmHg.  ARTERIAL DUPLEX: I have independently interpreted his arterial duplex scan today.  This shows that his bypass graft is patent.  There appears to be a stenosis at the distal anastomosis in the mid calf area.  There is biphasic flow throughout the graft at peak systolic velocity in the proximal anastomosis is 32 cm/s.  [name] [facility] [phone]

## 2021-03-05 NOTE — Assessment & Plan Note (Signed)
Currently stable on present regimen Continue with yearly low-dose CT screening program COVID-vaccine and pneumonia vaccines are up-to-date  Plan  Patient Instructions  Continue on TRELEGY 1 puff daily, rinse after use.  Yearly CT Chest .  Activity as tolerated.  Please wear your oxygen 4l/m .  Follow up with Dr. Elsworth Soho  In 6 months and As needed

## 2021-03-05 NOTE — Assessment & Plan Note (Signed)
Patient is to continue on oxygen.  Patient education on importance of oxygen compliance.  Patient is continue on 4 L of oxygen.

## 2021-03-05 NOTE — Addendum Note (Signed)
Addended by: Mathis Bud on: 03/05/2021 11:51 AM   Modules accepted: Orders

## 2021-03-05 NOTE — Assessment & Plan Note (Signed)
Appears euvolemic on exam.  Continue on current regimen 

## 2021-03-06 ENCOUNTER — Other Ambulatory Visit: Payer: Self-pay

## 2021-03-12 ENCOUNTER — Encounter: Payer: Self-pay | Admitting: *Deleted

## 2021-03-12 ENCOUNTER — Ambulatory Visit: Payer: Medicare HMO

## 2021-03-13 ENCOUNTER — Other Ambulatory Visit (HOSPITAL_COMMUNITY): Payer: Medicare HMO

## 2021-03-17 ENCOUNTER — Telehealth: Payer: Self-pay | Admitting: Acute Care

## 2021-03-17 DIAGNOSIS — Z87891 Personal history of nicotine dependence: Secondary | ICD-10-CM

## 2021-03-18 ENCOUNTER — Encounter: Payer: Self-pay | Admitting: Podiatry

## 2021-03-18 ENCOUNTER — Ambulatory Visit: Payer: Medicare HMO | Admitting: Podiatry

## 2021-03-18 ENCOUNTER — Other Ambulatory Visit: Payer: Self-pay

## 2021-03-18 DIAGNOSIS — M79674 Pain in right toe(s): Secondary | ICD-10-CM | POA: Diagnosis not present

## 2021-03-18 DIAGNOSIS — B353 Tinea pedis: Secondary | ICD-10-CM

## 2021-03-18 DIAGNOSIS — B351 Tinea unguium: Secondary | ICD-10-CM | POA: Diagnosis not present

## 2021-03-18 DIAGNOSIS — L84 Corns and callosities: Secondary | ICD-10-CM | POA: Diagnosis not present

## 2021-03-18 DIAGNOSIS — M79675 Pain in left toe(s): Secondary | ICD-10-CM

## 2021-03-18 DIAGNOSIS — E1142 Type 2 diabetes mellitus with diabetic polyneuropathy: Secondary | ICD-10-CM

## 2021-03-18 MED ORDER — KETOCONAZOLE 2 % EX CREA: TOPICAL_CREAM | CUTANEOUS | 0 refills | Status: DC

## 2021-03-18 NOTE — Patient Instructions (Signed)

## 2021-03-20 NOTE — Telephone Encounter (Signed)
He was scheduled for 03/12/21 but CXL I will need a new LCS CT order

## 2021-03-20 NOTE — Telephone Encounter (Signed)
LVM for the patient to return the call to reschedule his LCS CT

## 2021-03-20 NOTE — Telephone Encounter (Signed)
New  CT order placed

## 2021-03-20 NOTE — Telephone Encounter (Signed)
Will forward to [facility]) to contact pt to schedule f/u low dose ct.

## 2021-03-23 NOTE — Progress Notes (Signed)
Subjective: Kyle [name] is a pleasant 69 y.o. male patient seen today for at-risk foot care for painful thick toenails that are difficult to trim. He has h/o NIDDM with PAD.  Pain interferes with ambulation. Aggravating factors include wearing enclosed shoe gear. Pain is  relieved with periodic professional debridement.  He states he has a new job at [employer] and enjoys it very much. States his blood sugars are running in the 100's.  PCP is Alonna Buckler, MD. Last visit was: 10/22/2020.  No Known Allergies  Objective: Physical Exam  General: Kyle [name] is a pleasant 70 y.o. African American male, WD, WN in NAD. AAO x 3.   Vascular:  Capillary fill time to digits <3 seconds b/l lower extremities. Palpable pedal pulses b/l LE. Pedal hair absent. Lower extremity skin temperature gradient within normal limits.  Dermatological:  Pedal skin with normal turgor, texture and tone bilaterally. No open wounds bilaterally. No interdigital macerations bilaterally. Toenails 1-5 b/l elongated, discolored, dystrophic, thickened, crumbly with subungual debris and tenderness to dorsal palpation. Hyperkeratotic lesion(s) submet head 1 right foot, submet head 5 left foot and submet head 5 right foot.  No erythema, no edema, no drainage, no fluctuance. Diffuse scaling noted plantar aspect of right foot. No interdigital macerations.  No blisters, no weeping. No signs of secondary bacterial infection noted.  Musculoskeletal:  Normal muscle strength 5/5 to all lower extremity muscle groups bilaterally. No pain crepitus or joint limitation noted with ROM b/l. Hammertoe(s) noted to the 2-5 left and 1-5 right.  Neurological:  Protective sensation diminished with 10g monofilament b/l.  Assessment and Plan:  1. Pain due to onychomycosis of toenails of both feet   2. Tinea pedis of both feet   3. Callus   4. Diabetic peripheral neuropathy associated with type 2 diabetes mellitus ([place])      -Examined patient. -Patient to continue soft, supportive shoe gear daily. -Toenails 1-5 b/l were debrided in length and girth with sterile nail nippers and dremel without iatrogenic bleeding.  -Callus(es) submet head 1 right foot, submet head 5 left foot and submet head 5 right foot pared utilizing sterile scalpel blade without complication or incident. Total number debrided =3. -We discussed diabetic shoes with custom insoles to offload his plantar lesions. -Patient to report any pedal injuries to medical professional immediately. -For tinea pedis, prescription sent to pharmacy for Ketoconazole Cream 2% to be applied to left foot and right foot and between toes qd x 6 weeks. -Patient/POA to call should there be question/concern in the interim.    Return in about 3 months (around 06/18/2021) for diabetic nail trim.  Marzetta Board, DPM

## 2021-04-01 ENCOUNTER — Telehealth: Payer: Self-pay | Admitting: Cardiology

## 2021-04-01 ENCOUNTER — Telehealth: Payer: Self-pay

## 2021-04-01 MED ORDER — FUROSEMIDE 20 MG PO TABS: ORAL_TABLET | ORAL | 0 refills | Status: DC

## 2021-04-01 NOTE — Telephone Encounter (Signed)
I would have him take lasix 20 mg daily for three days and reinforce low sodium diet.  Gabriella Woodhead Martinique MD, [facility]

## 2021-04-01 NOTE — Telephone Encounter (Signed)
Pt c/o swelling: STAT is pt has developed SOB within 24 hours  1. If swelling, where is the swelling located? Left Leg  2. How much weight have you gained and in what time span? About 1-2 lbs overall  3. Have you gained 3 pounds in a day or 5 pounds in a week? no  4. Do you have a log of your daily weights (if so, list)? No. Pt only gained 1-2 lbs  5. Are you currently taking a fluid pill? no  6. Are you currently SOB? yes  7. Have you traveled recently? no

## 2021-04-01 NOTE — Telephone Encounter (Signed)
Returned call to patient's wife, patient of Dr. Martinique He has left leg swelling that going on for about 4 days - no pain, redness, tenderness to touch Swelling is present all day, wife states does not go down overnight -- wife is having him elevate legs Patient has new shortness of breath, per wife - patient states he gets out of breath when he showers Patient is still working at [employer] He has gained 1-2lbs over the course of a month He consumes sodium-rich foods, drinks a lot water -- educated on foods to avoid and why  He has a PV procedure on right leg on 04/04/2021 Wife is concerned about swelling, shortness of breath She would like MD advice regarding concerns, especially given his pending procedure  Will send to MD to review

## 2021-04-01 NOTE — Telephone Encounter (Signed)
Patient's wife called - patient is having some breathing issues and wants to be seen by cardiology prior to having aortogram. Took patient off Friday's schedule. Discussed taking patient to the ED if breathing problems worsened and he was unable to be seen in heart clinic. Wife agrees and will call back to reschedule.

## 2021-04-01 NOTE — Telephone Encounter (Signed)
Spoke to patient's wife Dr.Jordan's advice given.Advised he needs to follow low sodium diet.Advised to call back if swelling continues.

## 2021-04-02 NOTE — Telephone Encounter (Signed)
I spoke with Mrs. Kyle [name] and we now have Kyle [name]'s LCS CT scheduled for 04/09/21 @ 4:30pm

## 2021-04-04 ENCOUNTER — Ambulatory Visit (HOSPITAL_COMMUNITY): Admission: RE | Admit: 2021-04-04 | Payer: Medicare HMO | Source: Home / Self Care | Admitting: Vascular Surgery

## 2021-04-04 ENCOUNTER — Telehealth: Payer: Self-pay | Admitting: Pulmonary Disease

## 2021-04-04 ENCOUNTER — Encounter (HOSPITAL_COMMUNITY): Admission: RE | Payer: Self-pay | Source: Home / Self Care

## 2021-04-04 SURGERY — ABDOMINAL AORTOGRAM W/LOWER EXTREMITY
Anesthesia: LOCAL

## 2021-04-04 MED ORDER — TRELEGY ELLIPTA 100-62.5-25 MCG/INH IN AEPB: 1.0000 | INHALATION_SPRAY | Freq: Every day | RESPIRATORY_TRACT | 0 refills | Status: DC

## 2021-04-04 NOTE — Telephone Encounter (Signed)
     [name] calling to follow up, she said she thinks pt needs to be continue taking lasix since his swelling still in his foot

## 2021-04-04 NOTE — Telephone Encounter (Signed)
Left message for pt wife to call  

## 2021-04-04 NOTE — Telephone Encounter (Signed)
LMTCB for Kyle [name] ok per [facility]

## 2021-04-04 NOTE — Telephone Encounter (Signed)
I called and spoke with wife lois who is on DPR and stated the patient is out of Trelegy inhaler and she will not have money to get it until next week. I informed her I have placed 2 samples upfront for patient. She verbalized understanding, nothing further needed.

## 2021-04-07 ENCOUNTER — Observation Stay (HOSPITAL_COMMUNITY): Payer: Medicare HMO

## 2021-04-07 ENCOUNTER — Encounter (HOSPITAL_COMMUNITY): Payer: Self-pay

## 2021-04-07 ENCOUNTER — Inpatient Hospital Stay (HOSPITAL_COMMUNITY)
Admission: EM | Admit: 2021-04-07 | Discharge: 2021-04-12 | DRG: 291 | Disposition: A | Discharge status: Home / Self Care | Payer: Medicare HMO | Attending: Family Medicine | Admitting: Family Medicine

## 2021-04-07 ENCOUNTER — Other Ambulatory Visit: Payer: Self-pay

## 2021-04-07 ENCOUNTER — Emergency Department (HOSPITAL_BASED_OUTPATIENT_CLINIC_OR_DEPARTMENT_OTHER): Payer: Medicare HMO

## 2021-04-07 ENCOUNTER — Emergency Department (HOSPITAL_COMMUNITY): Payer: Medicare HMO

## 2021-04-07 DIAGNOSIS — M7989 Other specified soft tissue disorders: Secondary | ICD-10-CM

## 2021-04-07 DIAGNOSIS — E1122 Type 2 diabetes mellitus with diabetic chronic kidney disease: Secondary | ICD-10-CM | POA: Diagnosis present

## 2021-04-07 DIAGNOSIS — J9621 Acute and chronic respiratory failure with hypoxia: Secondary | ICD-10-CM | POA: Diagnosis not present

## 2021-04-07 DIAGNOSIS — R609 Edema, unspecified: Secondary | ICD-10-CM

## 2021-04-07 DIAGNOSIS — I4892 Unspecified atrial flutter: Secondary | ICD-10-CM | POA: Diagnosis not present

## 2021-04-07 DIAGNOSIS — Z7901 Long term (current) use of anticoagulants: Secondary | ICD-10-CM

## 2021-04-07 DIAGNOSIS — J439 Emphysema, unspecified: Secondary | ICD-10-CM | POA: Diagnosis present

## 2021-04-07 DIAGNOSIS — Z20822 Contact with and (suspected) exposure to covid-19: Secondary | ICD-10-CM | POA: Diagnosis present

## 2021-04-07 DIAGNOSIS — I130 Hypertensive heart and chronic kidney disease with heart failure and stage 1 through stage 4 chronic kidney disease, or unspecified chronic kidney disease: Secondary | ICD-10-CM | POA: Diagnosis not present

## 2021-04-07 DIAGNOSIS — J441 Chronic obstructive pulmonary disease with (acute) exacerbation: Secondary | ICD-10-CM | POA: Diagnosis not present

## 2021-04-07 DIAGNOSIS — R0902 Hypoxemia: Secondary | ICD-10-CM | POA: Diagnosis present

## 2021-04-07 DIAGNOSIS — Z794 Long term (current) use of insulin: Secondary | ICD-10-CM

## 2021-04-07 DIAGNOSIS — I480 Paroxysmal atrial fibrillation: Secondary | ICD-10-CM | POA: Diagnosis present

## 2021-04-07 DIAGNOSIS — I472 Ventricular tachycardia: Secondary | ICD-10-CM | POA: Diagnosis not present

## 2021-04-07 DIAGNOSIS — Z833 Family history of diabetes mellitus: Secondary | ICD-10-CM

## 2021-04-07 DIAGNOSIS — Z9981 Dependence on supplemental oxygen: Secondary | ICD-10-CM

## 2021-04-07 DIAGNOSIS — E1151 Type 2 diabetes mellitus with diabetic peripheral angiopathy without gangrene: Secondary | ICD-10-CM | POA: Diagnosis present

## 2021-04-07 DIAGNOSIS — I482 Chronic atrial fibrillation, unspecified: Secondary | ICD-10-CM | POA: Diagnosis not present

## 2021-04-07 DIAGNOSIS — Z9114 Patient's other noncompliance with medication regimen: Secondary | ICD-10-CM

## 2021-04-07 DIAGNOSIS — Z8249 Family history of ischemic heart disease and other diseases of the circulatory system: Secondary | ICD-10-CM

## 2021-04-07 DIAGNOSIS — I420 Dilated cardiomyopathy: Secondary | ICD-10-CM | POA: Diagnosis present

## 2021-04-07 DIAGNOSIS — N179 Acute kidney failure, unspecified: Secondary | ICD-10-CM | POA: Diagnosis present

## 2021-04-07 DIAGNOSIS — I272 Pulmonary hypertension, unspecified: Secondary | ICD-10-CM | POA: Diagnosis present

## 2021-04-07 DIAGNOSIS — E875 Hyperkalemia: Secondary | ICD-10-CM | POA: Diagnosis not present

## 2021-04-07 DIAGNOSIS — I5023 Acute on chronic systolic (congestive) heart failure: Secondary | ICD-10-CM

## 2021-04-07 DIAGNOSIS — N184 Chronic kidney disease, stage 4 (severe): Secondary | ICD-10-CM | POA: Diagnosis present

## 2021-04-07 DIAGNOSIS — I352 Nonrheumatic aortic (valve) stenosis with insufficiency: Secondary | ICD-10-CM | POA: Diagnosis present

## 2021-04-07 DIAGNOSIS — Z87891 Personal history of nicotine dependence: Secondary | ICD-10-CM

## 2021-04-07 DIAGNOSIS — I5043 Acute on chronic combined systolic (congestive) and diastolic (congestive) heart failure: Secondary | ICD-10-CM | POA: Diagnosis present

## 2021-04-07 DIAGNOSIS — I739 Peripheral vascular disease, unspecified: Secondary | ICD-10-CM | POA: Diagnosis present

## 2021-04-07 DIAGNOSIS — Z79899 Other long term (current) drug therapy: Secondary | ICD-10-CM

## 2021-04-07 DIAGNOSIS — E119 Type 2 diabetes mellitus without complications: Secondary | ICD-10-CM

## 2021-04-07 DIAGNOSIS — E785 Hyperlipidemia, unspecified: Secondary | ICD-10-CM | POA: Diagnosis present

## 2021-04-07 LAB — COMPREHENSIVE METABOLIC PANEL
ALT: 41 U/L (ref 0–44)
AST: 32 U/L (ref 15–41)
Albumin: 2.7 g/dL — ABNORMAL LOW (ref 3.5–5.0)
Alkaline Phosphatase: 87 U/L (ref 38–126)
Anion gap: 11 (ref 5–15)
BUN: 27 mg/dL — ABNORMAL HIGH (ref 8–23)
CO2: 22 mmol/L (ref 22–32)
Calcium: 8.6 mg/dL — ABNORMAL LOW (ref 8.9–10.3)
Chloride: 105 mmol/L (ref 98–111)
Creatinine, Ser: 2.29 mg/dL — ABNORMAL HIGH (ref 0.61–1.24)
GFR, Estimated: 30 mL/min — ABNORMAL LOW (ref >60)
Glucose, Bld: 225 mg/dL — ABNORMAL HIGH (ref 70–99)
Potassium: 4.3 mmol/L (ref 3.5–5.1)
Sodium: 138 mmol/L (ref 135–145)
Total Bilirubin: 1 mg/dL (ref 0.3–1.2)
Total Protein: 6.1 g/dL — ABNORMAL LOW (ref 6.5–8.1)

## 2021-04-07 LAB — CBC
HCT: 47.3 % (ref 39.0–52.0)
Hemoglobin: 14.9 g/dL (ref 13.0–17.0)
MCH: 30.6 pg (ref 26.0–34.0)
MCHC: 31.5 g/dL (ref 30.0–36.0)
MCV: 97.1 fL (ref 80.0–100.0)
Platelets: 207 10*3/uL (ref 150–400)
RBC: 4.87 MIL/uL (ref 4.22–5.81)
RDW: 17.8 % — ABNORMAL HIGH (ref 11.5–15.5)
WBC: 13.7 10*3/uL — ABNORMAL HIGH (ref 4.0–10.5)
nRBC: 0.7 % — ABNORMAL HIGH (ref 0.0–0.2)

## 2021-04-07 LAB — TROPONIN I (HIGH SENSITIVITY)
Troponin I (High Sensitivity): 99 ng/L — ABNORMAL HIGH (ref <18)
Troponin I (High Sensitivity): 88 ng/L — ABNORMAL HIGH (ref <18)

## 2021-04-07 LAB — I-STAT ARTERIAL BLOOD GAS, ED
Acid-base deficit: 1 mmol/L (ref 0.0–2.0)
Bicarbonate: 22.8 mmol/L (ref 20.0–28.0)
Calcium, Ion: 1.13 mmol/L — ABNORMAL LOW (ref 1.15–1.40)
HCT: 45 % (ref 39.0–52.0)
Hemoglobin: 15.3 g/dL (ref 13.0–17.0)
O2 Saturation: 99 %
Patient temperature: 97.8
Potassium: 4.2 mmol/L (ref 3.5–5.1)
Sodium: 137 mmol/L (ref 135–145)
TCO2: 24 mmol/L (ref 22–32)
pCO2 arterial: 34.3 mmHg (ref 32.0–48.0)
pH, Arterial: 7.429 (ref 7.350–7.450)
pO2, Arterial: 111 mmHg — ABNORMAL HIGH (ref 83.0–108.0)

## 2021-04-07 LAB — I-STAT CHEM 8, ED
BUN: 28 mg/dL — ABNORMAL HIGH (ref 8–23)
Calcium, Ion: 1.17 mmol/L (ref 1.15–1.40)
Chloride: 105 mmol/L (ref 98–111)
Creatinine, Ser: 2.3 mg/dL — ABNORMAL HIGH (ref 0.61–1.24)
Glucose, Bld: 221 mg/dL — ABNORMAL HIGH (ref 70–99)
HCT: 48 % (ref 39.0–52.0)
Hemoglobin: 16.3 g/dL (ref 13.0–17.0)
Potassium: 4.2 mmol/L (ref 3.5–5.1)
Sodium: 140 mmol/L (ref 135–145)
TCO2: 22 mmol/L (ref 22–32)

## 2021-04-07 LAB — RESP PANEL BY RT-PCR (FLU A&B, COVID) ARPGX2
Influenza A by PCR: NEGATIVE
Influenza B by PCR: NEGATIVE
SARS Coronavirus 2 by RT PCR: NEGATIVE

## 2021-04-07 LAB — CBG MONITORING, ED
Glucose-Capillary: 168 mg/dL — ABNORMAL HIGH (ref 70–99)
Glucose-Capillary: 358 mg/dL — ABNORMAL HIGH (ref 70–99)

## 2021-04-07 LAB — HIV ANTIBODY (ROUTINE TESTING W REFLEX): HIV Screen 4th Generation wRfx: NONREACTIVE

## 2021-04-07 LAB — BRAIN NATRIURETIC PEPTIDE: B Natriuretic Peptide: 1899.5 pg/mL — ABNORMAL HIGH (ref 0.0–100.0)

## 2021-04-07 LAB — MAGNESIUM: Magnesium: 2.2 mg/dL (ref 1.7–2.4)

## 2021-04-07 MED ORDER — ACETAMINOPHEN 325 MG PO TABS: 650.0000 mg | ORAL_TABLET | Freq: Four times a day (QID) | ORAL | Status: DC | PRN

## 2021-04-07 MED ORDER — INSULIN DETEMIR 100 UNIT/ML ~~LOC~~ SOLN
14.0000 [IU] | Freq: Every day | SUBCUTANEOUS | Status: DC
  Administered 2021-04-08: 14 [IU] via SUBCUTANEOUS
  Filled 2021-04-07: qty 0.14

## 2021-04-07 MED ORDER — IPRATROPIUM-ALBUTEROL 0.5-2.5 (3) MG/3ML IN SOLN
3.0000 mL | Freq: Four times a day (QID) | RESPIRATORY_TRACT | Status: DC
  Administered 2021-04-07 – 2021-04-12 (×18): 3 mL via RESPIRATORY_TRACT
  Filled 2021-04-07 (×18): qty 3

## 2021-04-07 MED ORDER — ARFORMOTEROL TARTRATE 15 MCG/2ML IN NEBU: 15.0000 ug | INHALATION_SOLUTION | Freq: Two times a day (BID) | RESPIRATORY_TRACT | Status: DC

## 2021-04-07 MED ORDER — FLUTICASONE-UMECLIDIN-VILANT 100-62.5-25 MCG/INH IN AEPB: 1.0000 | INHALATION_SPRAY | Freq: Every day | RESPIRATORY_TRACT | Status: DC

## 2021-04-07 MED ORDER — GUAIFENESIN ER 600 MG PO TB12
600.0000 mg | ORAL_TABLET | Freq: Two times a day (BID) | ORAL | Status: DC
  Administered 2021-04-07 – 2021-04-12 (×10): 600 mg via ORAL
  Filled 2021-04-07 (×10): qty 1

## 2021-04-07 MED ORDER — APIXABAN 5 MG PO TABS
5.0000 mg | ORAL_TABLET | Freq: Two times a day (BID) | ORAL | Status: DC
  Administered 2021-04-07 – 2021-04-12 (×10): 5 mg via ORAL
  Filled 2021-04-07 (×10): qty 1

## 2021-04-07 MED ORDER — INSULIN DETEMIR 100 UNIT/ML ~~LOC~~ SOLN: 28.0000 [IU] | Freq: Every day | SUBCUTANEOUS | Status: DC

## 2021-04-07 MED ORDER — AMLODIPINE BESYLATE 5 MG PO TABS
5.0000 mg | ORAL_TABLET | Freq: Every day | ORAL | Status: DC
  Administered 2021-04-08: 5 mg via ORAL
  Filled 2021-04-07: qty 1

## 2021-04-07 MED ORDER — CARVEDILOL 6.25 MG PO TABS
6.2500 mg | ORAL_TABLET | Freq: Two times a day (BID) | ORAL | Status: DC
  Administered 2021-04-07 – 2021-04-12 (×10): 6.25 mg via ORAL
  Filled 2021-04-07 (×2): qty 1
  Filled 2021-04-07: qty 2
  Filled 2021-04-07 (×2): qty 1
  Filled 2021-04-07: qty 2
  Filled 2021-04-07 (×4): qty 1

## 2021-04-07 MED ORDER — INSULIN DETEMIR 100 UNIT/ML ~~LOC~~ SOLN
14.0000 [IU] | Freq: Every day | SUBCUTANEOUS | Status: DC
  Filled 2021-04-07: qty 0.14

## 2021-04-07 MED ORDER — METHYLPREDNISOLONE SODIUM SUCC 125 MG IJ SOLR
125.0000 mg | Freq: Once | INTRAMUSCULAR | Status: AC
  Administered 2021-04-07: 125 mg via INTRAVENOUS
  Filled 2021-04-07: qty 2

## 2021-04-07 MED ORDER — INSULIN ASPART 100 UNIT/ML IJ SOLN: 0.0000 [IU] | Freq: Three times a day (TID) | INTRAMUSCULAR | Status: DC

## 2021-04-07 MED ORDER — ASPIRIN 81 MG PO CHEW
324.0000 mg | CHEWABLE_TABLET | Freq: Once | ORAL | Status: AC
  Administered 2021-04-07: 324 mg via ORAL
  Filled 2021-04-07: qty 4

## 2021-04-07 MED ORDER — ACETAMINOPHEN 650 MG RE SUPP: 650.0000 mg | Freq: Four times a day (QID) | RECTAL | Status: DC | PRN

## 2021-04-07 MED ORDER — PREDNISONE 20 MG PO TABS
40.0000 mg | ORAL_TABLET | Freq: Every day | ORAL | Status: AC
  Administered 2021-04-08 – 2021-04-09 (×2): 40 mg via ORAL
  Filled 2021-04-07 (×2): qty 2

## 2021-04-07 MED ORDER — ALBUTEROL SULFATE HFA 108 (90 BASE) MCG/ACT IN AERS: 2.0000 | INHALATION_SPRAY | Freq: Four times a day (QID) | RESPIRATORY_TRACT | Status: DC

## 2021-04-07 MED ORDER — ALBUTEROL SULFATE (2.5 MG/3ML) 0.083% IN NEBU: 2.5000 mg | INHALATION_SOLUTION | RESPIRATORY_TRACT | Status: DC | PRN

## 2021-04-07 MED ORDER — AZITHROMYCIN 250 MG PO TABS
500.0000 mg | ORAL_TABLET | Freq: Every day | ORAL | Status: AC
  Administered 2021-04-07 – 2021-04-09 (×3): 500 mg via ORAL
  Filled 2021-04-07 (×3): qty 2

## 2021-04-07 MED ORDER — IPRATROPIUM-ALBUTEROL 0.5-2.5 (3) MG/3ML IN SOLN
3.0000 mL | Freq: Once | RESPIRATORY_TRACT | Status: AC
  Administered 2021-04-07: 3 mL via RESPIRATORY_TRACT
  Filled 2021-04-07: qty 3

## 2021-04-07 MED ORDER — FUROSEMIDE 10 MG/ML IJ SOLN
20.0000 mg | Freq: Once | INTRAMUSCULAR | Status: AC
  Administered 2021-04-07: 20 mg via INTRAVENOUS
  Filled 2021-04-07: qty 2

## 2021-04-07 MED ORDER — IPRATROPIUM BROMIDE 0.02 % IN SOLN: 0.5000 mg | Freq: Four times a day (QID) | RESPIRATORY_TRACT | Status: DC

## 2021-04-07 MED ORDER — BUDESONIDE 0.25 MG/2ML IN SUSP
0.2500 mg | Freq: Two times a day (BID) | RESPIRATORY_TRACT | Status: DC
  Administered 2021-04-07 – 2021-04-12 (×10): 0.25 mg via RESPIRATORY_TRACT
  Filled 2021-04-07 (×11): qty 2

## 2021-04-07 MED ORDER — INSULIN ASPART 100 UNIT/ML IJ SOLN
0.0000 [IU] | Freq: Three times a day (TID) | INTRAMUSCULAR | Status: DC
  Administered 2021-04-07: 20 [IU] via SUBCUTANEOUS
  Administered 2021-04-08: 4 [IU] via SUBCUTANEOUS
  Administered 2021-04-08: 11 [IU] via SUBCUTANEOUS
  Administered 2021-04-08 – 2021-04-09 (×2): 4 [IU] via SUBCUTANEOUS
  Administered 2021-04-09 – 2021-04-10 (×2): 3 [IU] via SUBCUTANEOUS
  Administered 2021-04-10: 7 [IU] via SUBCUTANEOUS
  Administered 2021-04-10: 4 [IU] via SUBCUTANEOUS
  Administered 2021-04-11: 3 [IU] via SUBCUTANEOUS
  Administered 2021-04-11: 11 [IU] via SUBCUTANEOUS
  Administered 2021-04-12 (×2): 7 [IU] via SUBCUTANEOUS

## 2021-04-07 MED ORDER — FUROSEMIDE 10 MG/ML IJ SOLN
40.0000 mg | INTRAMUSCULAR | Status: AC
  Administered 2021-04-07: 40 mg via INTRAVENOUS
  Filled 2021-04-07: qty 4

## 2021-04-07 MED ORDER — FUROSEMIDE 10 MG/ML IJ SOLN: 20.0000 mg | Freq: Every day | INTRAMUSCULAR | Status: DC

## 2021-04-07 MED ORDER — ROSUVASTATIN CALCIUM 5 MG PO TABS
10.0000 mg | ORAL_TABLET | Freq: Every day | ORAL | Status: DC
  Administered 2021-04-07 – 2021-04-11 (×5): 10 mg via ORAL
  Filled 2021-04-07 (×5): qty 2

## 2021-04-07 NOTE — ED Provider Notes (Signed)
[facility] EMERGENCY DEPARTMENT Provider Note   CSN: [id number] Arrival date & time: 04/07/21  1009     History Chief Complaint  Patient presents with   Shortness of Breath    Kyle [name] is a 70 y.o. male.  HPI  70 year old male with past medical history of atrial fibrillation anticoagulant Eliquis, HTN, HLD, CKD, CHF, COPD on 4-1/2 L nasal cannula presents to the emergency department with concern for shortness of breath.  Patient states for the past week he has been feeling short of breath, nonproductive cough.  No chest pain or fever.  [facility] which was reportedly negative and that is when they found that he was 70% on room air, placed him on nasal cannula and called an ambulance.  Patient states has been compliant with his medications.  Past Medical History:  Diagnosis Date   Aortic insufficiency    MODERATE WITH A BICUSPID AORTIC VAVLE   Arterial occlusive disease    MULTILEVEL   CHF (congestive heart failure) (HCC)    COPD (chronic obstructive pulmonary disease) ([place])    Dilated cardiomyopathy ([place])    WITH EJECTION FRACTION DOWN TO 20-25%--WITH CONGESTIVE HEART FAILURE   Edema    LOWER EXTREMETIES   Hyperglycemia 01/2020   Hypertension    Normal coronary arteries 2009   Orthopnea    Peripheral arterial disease ([place])    Pulmonary hypertension (HCC)    SOB (shortness of breath)     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/20/2020   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[place]) 02/20/2020   Hyperosmolar hyperglycemic state (HHS) ([place]) 02/19/2020   CKD (chronic kidney disease) stage 4, GFR 15-29 ml/min ([place]) 02/19/2020   Abnormal findings on diagnostic imaging of lung 01/29/2020   Anticoagulant long-term use 01/29/2020   Heart failure ([place]) 01/20/2020   SOB (shortness of breath)    Uremia 12/24/2019   Atrial fibrillation, chronic ([place])  12/24/2019   Diabetes mellitus type 2, insulin dependent ([place]) 12/24/2019   Hyperkalemia, diminished renal excretion 12/24/2019   Healthcare maintenance 07/14/2019   Medication management 07/14/2019   Surgical wound dehiscence, initial encounter 05/11/2019   PAD (peripheral artery disease) ([place]) 04/22/2019   Atherosclerosis of artery of extremity with rest pain ([place]) 04/21/2019   Deformity of metatarsal bone of right foot 04/17/2019   COPD with chronic bronchitis and emphysema ([place]) 12/16/2018   Chronic respiratory failure with hypoxia ([place]) 12/16/2018   Acute renal failure (HCC)    Elevated PSA 12/27/2017   Abnormal EKG 12/21/2017   Protein-calorie malnutrition, severe ([place]) 12/18/2014   Aortoiliac occlusive disease ([place]) 12/16/2014   PVD (peripheral vascular disease) ([place]) 10/24/2014   Aortic stenosis 09/12/2014   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   Pulmonary hypertension ([place]) 09/12/2014   Dyslipidemia 09/12/2014   Atherosclerosis of native arteries of extremity with intermittent claudication ([place]) 05/11/2012   Aortic insufficiency    Hypertension    Peripheral arterial disease ([place])    Dilated cardiomyopathy ([place])    Chronic systolic CHF (congestive heart failure) ([place])     Past Surgical History:  Procedure Laterality Date   ABDOMINAL AORTAGRAM N/A 11/05/2014   Procedure: ABDOMINAL Maxcine Ham;  Surgeon: Angelia Mould, MD;  Location: [facility] CATH LAB;  Service: Cardiovascular;  Laterality: N/A;   ABDOMINAL AORTOGRAM W/LOWER EXTREMITY Bilateral 04/21/2019   Procedure: ABDOMINAL AORTOGRAM W/LOWER EXTREMITY;  Surgeon: Angelia Mould,  MD;  Location: [place] CV LAB;  Service: Cardiovascular;  Laterality: Bilateral;   AORTA - BILATERAL FEMORAL ARTERY BYPASS GRAFT N/A 12/18/2014   Procedure: AORTOBIFEMORAL BYPASS GRAFT;  Surgeon: Angelia Mould, MD;  Location: [place];  Service: Vascular;  Laterality: N/A;   CARDIAC CATHETERIZATION  2009   Nl Cors, EF  20%   COLONOSCOPY     ENDARTERECTOMY FEMORAL Left 12/18/2014   Procedure: ENDARTERECTOMY FEMORAL;  Surgeon: Angelia Mould, MD;  Location: [place];  Service: Vascular;  Laterality: Left;   FALSE ANEURYSM REPAIR Right 04/24/2019   Procedure: REPAIR OF RIGHT FEMORAL ARTERY PSEUDOANEURYSM;  Surgeon: Rosetta Posner, MD;  Location: [place];  Service: Vascular;  Laterality: Right;   FEMORAL-POPLITEAL BYPASS GRAFT  02/21/2011   right   FEMORAL-TIBIAL BYPASS GRAFT Right 04/24/2019   Procedure: REDO BYPASS GRAFT RIGHT FEMORAL-TIBIAL ARTERY USING LEFT LEG VEIN & HEMASHIELD GOLD 46m GRAFT;  Surgeon: ERosetta Posner MD;  [facility]  Service: Vascular;  Laterality: Right;   IR FLUORO GUIDE CV LINE RIGHT  12/28/2019   IR REMOVAL TUN CV CATH W/O FL  01/11/2020   IR [place] VASC ACCESS RIGHT  12/28/2019   OTHER SURGICAL HISTORY     POST RIGHT FEMOROPOPLITEAL BYPASS GRAFT   PERIPHERAL VASCULAR CATHETERIZATION  11/05/2014   Procedure: LOWER EXTREMITY ANGIOGRAPHY;  Surgeon: CAngelia Mould MD;  [facility];  Service: Cardiovascular;;   VEIN HARVEST Left 04/24/2019   Procedure: LEFT LEG SAPHENOUS VEIN HARVEST;  Surgeon: ERosetta Posner MD;  [facility] OR;  Service: Vascular;  Laterality: Left;       Family History  Problem Relation Age of Onset   Diabetes Mother    Hyperlipidemia Sister    Hypertension Sister     Social History   Tobacco Use   Smoking status: Former    Packs/day: 1.00    Years: 40.00    Pack years: 40.00    Types: Cigarettes    Quit date: 10/15/2018    Years since quitting: 2.4   Smokeless tobacco: Never   Tobacco comments:    quit 2010 and quit again 2019  Vaping Use   Vaping Use: Never used  Substance Use Topics   Alcohol use: No    Alcohol/week: 0.0 standard drinks   Drug use: No    Home Medications Prior to Admission medications   Medication Sig Start Date End Date Taking? Authorizing Provider  ACCU-CHEK FASTCLIX LANCETS MFargo 07/30/18   [provider]  albuterol (PROVENTIL) (2.5 MG/3ML) 0.083% nebulizer solution Take 3 mLs (2.5 mg total) by nebulization every 6 (six) hours as needed for wheezing or shortness of breath. 03/11/20   ARigoberto Noel MD  amLODipine (NORVASC) 5 MG tablet TAKE 1 TABLET EVERY DAY Patient taking differently: Take 5 mg by mouth in the morning. 01/06/21   JMartinique Peter M, MD  carvedilol (COREG) 12.5 MG tablet Take 12.5 mg by mouth in the morning.    [provider]  carvedilol (COREG) 6.25 MG tablet Take 1 tablet (6.25 mg total) by mouth 2 (two) times daily with a meal. Patient not taking: Reported on 03/06/2021 01/11/20 03/11/20  KAntonieta Pert MD  ELIQUIS 5 MG TABS tablet TAKE 1 TABLET TWICE DAILY Patient taking differently: Take 5 mg by mouth 2 (two) times daily. 01/30/21   JMartinique Peter M, MD  Fluticasone-Umeclidin-Vilant (TRELEGY ELLIPTA) 100-62.5-25 MCG/INH AEPB Inhale 1 puff into the lungs daily. 03/05/21   Parrett, TLynelle Smoke  S, NP  Fluticasone-Umeclidin-Vilant (TRELEGY ELLIPTA) 100-62.5-25 MCG/INH AEPB Inhale 1 puff into the lungs daily. 04/04/21   Rigoberto Noel, MD  furosemide (LASIX) 20 MG tablet Take 20 mg daily for 3 days only then stop 04/01/21   Martinique, Peter M, MD  glucose blood (ACCU-CHEK GUIDE) test strip one strip (1 each dose) by Other route 2 (two) times daily. Test blood glucose 2 times a day. 11/21/19   [provider]  guaiFENesin (MUCINEX) 600 MG 12 hr tablet Take 2 tablets (1,200 mg total) by mouth 2 (two) times daily. Patient taking differently: Take 600 mg by mouth 2 (two) times daily. 11/30/18   Lavina Hamman, MD  insulin aspart (NOVOLOG) 100 UNIT/ML FlexPen Inject 3 Units into the skin 3 (three) times daily with meals. Patient taking differently: Inject 6 Units into the skin in the morning and at bedtime. 02/23/20   Allie Bossier, MD  insulin detemir (LEVEMIR) 100 unit/ml SOLN Inject 0.25 mLs (25 Units total) into the skin daily. Patient taking differently: Inject 28 Units into  the skin in the morning. 02/23/20   Allie Bossier, MD  Insulin Pen Needle 32G X 4 MM MISC Use as directed with insulin pen 03/01/20   [provider]  ketoconazole (NIZORAL) 2 % cream Apply to both feet and between toes once daily for 6 weeks. 03/18/21   Marzetta Board, DPM  levETIRAcetam (KEPPRA) 500 MG tablet Take 500 mg by mouth in the morning. 02/12/21   [provider]  Multiple Vitamin (MULTIVITAMIN WITH MINERALS) TABS tablet Take 1 tablet by mouth in the morning. Centrum    [provider]  OXYGEN Inhale 4.5 L into the lungs continuous.    [provider]  rosuvastatin (CRESTOR) 10 MG tablet Take 1 tablet (10 mg total) by mouth daily at 6 PM. Patient taking differently: Take 10 mg by mouth at bedtime. 01/24/20   Hoyt Koch, MD    Allergies    Patient has no known allergies.  Review of Systems   Review of Systems  Constitutional:  Positive for fatigue. Negative for chills and fever.  HENT:  Negative for congestion.   Eyes:  Negative for visual disturbance.  Respiratory:  Positive for cough and shortness of breath. Negative for chest tightness.   Cardiovascular:  Negative for chest pain and palpitations.  Gastrointestinal:  Negative for abdominal pain, diarrhea and vomiting.  Genitourinary:  Negative for dysuria.  Skin:  Negative for rash.  Neurological:  Negative for headaches.   Physical Exam Updated Vital Signs There were no vitals taken for this visit.  Physical Exam Vitals and nursing note reviewed.  Constitutional:      Appearance: Normal appearance. He is diaphoretic.  HENT:     Head: Normocephalic.     Mouth/Throat:     Mouth: Mucous membranes are moist.  Cardiovascular:     Rate and Rhythm: Tachycardia present. Rhythm irregular.  Pulmonary:     Effort: Pulmonary effort is normal. No respiratory distress.     Breath sounds: Examination of the right-middle field reveals rales. Examination of the left-middle field  reveals rales. Wheezing and rales present.  Chest:     Chest wall: No tenderness.  Abdominal:     Palpations: Abdomen is soft.     Tenderness: There is no abdominal tenderness.  Skin:    General: Skin is warm.  Neurological:     Mental Status: He is alert and oriented to person, place, and time. Mental  status is at baseline.  Psychiatric:        Mood and Affect: Mood normal.    ED Results / Procedures / Treatments   Labs (all labs ordered are listed, but only abnormal results are displayed) Labs Reviewed  CBC  COMPREHENSIVE METABOLIC PANEL  BRAIN NATRIURETIC PEPTIDE  CBG MONITORING, ED  I-STAT CHEM 8, ED  TROPONIN I (HIGH SENSITIVITY)    EKG None  Radiology No results found.  Procedures .Critical Care  Date/Time: 04/07/2021 1:39 PM Performed by: Lorelle Gibbs, DO Authorized by: Lorelle Gibbs, DO   Critical care provider statement:    Critical care time (minutes):  45   Critical care was necessary to treat or prevent imminent or life-threatening deterioration of the following conditions:  Respiratory failure   Critical care was time spent personally by me on the following activities:  Discussions with consultants, evaluation of patient's response to treatment, examination of patient, ordering and performing treatments and interventions, ordering and review of laboratory studies, ordering and review of radiographic studies, pulse oximetry, re-evaluation of patient's condition, obtaining history from patient or surrogate and review of old charts   I assumed direction of critical care for this patient from another provider in my specialty: no     Medications Ordered in ED Medications  aspirin chewable tablet 324 mg (has no administration in time range)  ipratropium-albuterol (DUONEB) 0.5-2.5 (3) MG/3ML nebulizer solution 3 mL (has no administration in time range)  methylPREDNISolone sodium succinate (SOLU-MEDROL) 125 mg/2 mL injection 125 mg (has no administration  in time range)    ED Course  I have reviewed the triage vital signs and the nursing notes.  Pertinent labs & imaging results that were available during my care of the patient were reviewed by me and considered in my medical decision making (see chart for details).    MDM Rules/Calculators/A&P                          70 year old male presents emergency department concern for shortness of breath and hypoxia.  Patient supposed to be wearing 4-1/2 L nasal cannula 24/7.  However [facility] not [facility] his oxygen chink charge so he did not bring it with him.  [facility] was [facility] to [facility] to [facility] 72s.  Patient arrived on a nonrebreather, tachypneic, mild respiratory distress.  He had bilateral wheezing and rales.  He was also noted to be in new atrial flutter.  He has history of atrial fibrillation, is anticoagulated on Eliquis, appears last time he had A. fib was before 2021.  Patient denies any chest pain.  Blood pressure is stable.  After breathing treatment and steroids his lung auscultation is improved, his work of breathing is improved, heart rate has decreased.  Chest x-ray shows chronic interstitial changes.  Blood work shows worsening kidney dysfunction, elevated BNP, elevated troponin, mild leukocytosis.  Magnesium is normal.  Repeat troponin is pending.  On reevaluation the patient has converted back to sinus rhythm.  His tachypnea has resolved, he is now on 6 L nasal cannula with normal respiratory rate.  He appears much improved.  Patient will require admission for demand ischemia, mild CHF with worsening AKI in the setting of atrial flutter and hypoxia.  Lower suspicion for PE at this time given he is anticoagulated, he had great improvement with treatment and spontaneous conversion back to sinus rhythm.  Patients evaluation and results requires  admission for further treatment and care. Patient agrees with admission plan, offers no new complaints and is stable/unchanged  at time of admit.   Final Clinical Impression(s) / ED Diagnoses Final diagnoses:  None    Rx / DC Orders ED Discharge Orders     None        Lorelle Gibbs, DO 04/07/21 1340

## 2021-04-07 NOTE — ED Notes (Signed)
Pt's oxygen saturation remained at 85-88% with a good waveform on 7L [place]. This tech walked in the room and bumped the O2 to 8L [place]. After observing for a few minutes, pt's O2 sats still remained at 87-88% and switched to a nonrebreather mask. Pt's sats are now at 97%. Hennie Duos, RN and [place] notified.

## 2021-04-07 NOTE — Progress Notes (Signed)
Lower extremity venous has been completed.   Preliminary results in CV Proc.   Abram Sander 04/07/2021 12:52 PM

## 2021-04-07 NOTE — Progress Notes (Signed)
FPTS Interim Progress Note  S: Nightly check on patient, he is currently on nonrebreather and states that he did not feel like his shortness of breath had gotten worse at the time he was put on it.  He denies having increased cough or sputum.  Reports that his dry weight is typically around 163lbs but he wasn't completely sure. He was urinating more after his dose of Lasix this morning. Has no chest pain and states abdomen is not enlarged from baseline  O: BP 124/90   Pulse (!) 56   Temp 97.8 F (36.6 C) (Oral)   Resp 16   Ht '5\' 10"'$  (1.778 m)   Wt 72.6 kg   SpO2 90%   BMI 22.97 kg/m   General: sitting up in bed, attempting to adjust his body and wires in the bed CV: RRR, systolic murmur appreciated, minimal peripheral edema Lungs: bibasilar coarse crackles, able to speak in short sentences, loud upper airway sounds with breathing Extremities: feet examined, xerosis of R plantar foot, no ulcers appreciated  A/P: CHF exacerbation Patient was not given his 1730 dose of Lasix, nursing has been contacted to administer to patient.  CXR with cardiomegaly, vascular congestion and bibasilar atelectasis. Patient had bibasilar crackles on exam - Follow-up ABG (if it looks poor and worse respiratory status, will consider placing on Bi-Pap) - Follow-up echocardiogram - Continue to monitor UOP once given Lasix dose - Monitor respiratory status  Dashawn Golda, Lorrin Goodell, DO 04/07/2021, 9:07 PM PGY-1, [place] pager [phone]

## 2021-04-07 NOTE — Hospital Course (Addendum)
Kyle [name] is a 70 y.o. male presenting with dyspnea. PMH is significant for atrial fibrillation anticoagulant Eliquis, HTN, HLD, CKD, CHF, COPD on 4-1/2 L nasal cannula.    Acute hypoxemic respiratory failure  CHF exacerbation Patient was admitted for increasing shortness of breath in the setting of medication noncompliance.  In the ED, patient was requiring increased oxygen from baseline, admitted on 15 L nonrebreather was able to be weaned somewhat.  BNP was 1899, troponins were elevated but flat, EKG initially showed atrial flutter but was NSR on repeat.  CXR showed pulmonary edema, DVT ultrasounds were negative.  Patient received 1 dose of IV Lasix with initial improvement and then had increased oxygen requirement, repeat CXR showed worsening edema.  Patient was given IV Lasix 40 mg.  Echocardiogram showed Hypokinesis of the inferolateral wall with overall mild LV  dysfunction.  Cardiology was consulted.   Aggressively diuresed with IV lasix.  Added on Imdur 15 and Hydralazine for tighter blood pressure control. Transitioned to PO Lasix by time of discharge.    Concern for COPD exacerbation Patient with history of COPD with baseline oxygen requirement of 4.5 L was requiring increased oxygen in the setting of not having Trelegy.  Patient has required nonrebreather, was given duo nebs with some improvement initially.  Patient was started on prednisone and azithromycin, completed 3 day course of azithromycin and 5 day course of Prednisone.  Repeat chest X-Ray showed improvement. Back to baseline O2 requirement by time of discharge and discharged with home medications.   AKI on CKD stage IIIa Creatinine was elevated from baseline on admission.  Noted to previously require hemodialysis for an AKI.  Fluids were held initially given fluid overload.    Other chronic conditions were stable during hospitalization  Issues for follow-up: Follow up w/ Neurology for the need for Keppra Follow up with  Cardiology Dr. Martinique Repeat BMP to monitor creatinine

## 2021-04-07 NOTE — H&P (Addendum)
[facility] Admission History and Physical Service Pager: [phone]  Patient name: Kyle [name] Medical record number: [id number] Date of birth: July 10, 1951 Age: 70 y.o. Gender: male  Primary Care Provider: Alonna Buckler, MD Consultants: None Code Status: FULL Preferred Emergency Contact: Wife, Faylene Kurtz   Chief Complaint: Shortness of Breath  Assessment and Plan: Kyle [name] is a 70 y.o. male presenting with dyspnea. PMH is significant for atrial fibrillation anticoagulant Eliquis, HTN, HLD, CKD, CHF, COPD on 4-1/2 L nasal cannula.    Acute Hypoxic Respiratory Failure  Patient endorses shortness of breath over past 3 days.  Has been out of Trelegy inhaler over this time period.  Denies increase in sputum production.   Has not been able to afford Trelegy and never got Duoneb machine.  Also has issue with Transporting portable O2 and indicates battery dies [place]. Currently hemodynamically stable.   On 4.5L at baseline but [facility].  Initially started on 15L rebreather.  Breathing improved with Duoneb and   Able to be weaned down to [place].  Currently satting 88-95% on 6L [place]. Trops 90>88.  BNP- Initial EKG showed patient on admission in A-flutter.  Repeat EKG showed return NS rhthym.  No sign of ST elevation.  Low concern with MI.  Symptoms thought likely due to combination of untreated COPD and exacerbation of HFpEF.  Also has history of Aortic Stenosis and Insufficiency, concerning cause for worsening/exacerbation of HF.  Obtain ECHO and consider Cards consult in AM. Will manage as COPD exacerbation given chest X-Ray with extensive .  WBC elevated, but this may be due to demargination after steroid administration.  Less concern for PE given Negative Doppler, Anticoagulation therapy, and Wells score of 0.  Plan for COPD treatment and diuresis.  - Admit to FPTS, Attending Dr Owens Shark - Continuous Pulse Ox, Keep O2 stats >88% - IV Lasix 20  mg daily - Continue daily Trelegy - Oral steroid daily - Azithromycin 500 mg qd x 3 days - Albuterol nebulizer q6hr PRN - Scheduled DUO-NEBS q6h  - Pulmicort nebulizer BID  - PT, OT eval and treat - follow up ECHO - BMP, CBC w/ Diff in AM - Consider cardiology consult in AM  - ABG is respiratory status declines   Acute on Chronic CHF, HFpEF  On Carvedilol 6.25 BID.  ECHO in 2021 showed EF 65-70% but was previously 40-45%.  Also history of aortic Insufficiency, mild to moderate aortic stenosis and pulmonary HTN.  Ordered Repeat ECHO.  Plan to diurese. - follow up ECHO - IV Lasix 20 mg daily - See above   COPD exacerbation  On 4.5L at baseline.  Patient has been without Trelegy for the past week. Not using Trelegy has he has been unable to afford this medication. Consider alternative medications. Initially started on 15L rebreather.  Breathing improved with Duoneb and   Able to be weaned down to [place].  Currently satting 88-95% on 6L [place].   Patient reports he quit smoking about 5 years ago. Reviewed LDCT from May 2021 showing Lungs RADS 2.  - TOC consult, medication assistance  - Keep O2 sat >88% - Hold home Trelegy until respiratory status improved  - Recommend repeat low dose CT outpatient  - Prednisone 40 mg and Azithromycin  - Nebulizer's as above  - wean to baseline oxygen as tolerated    AKI on CKD stage 3a Cr- 2.3.  Baseline creatinine 1.7.  Plan to hold off IV  fluids given fluid overload. Pt previously required HD for AKI.  - PM BMP while on Lasix  - Daily AM BMP - Avoid renotoxic medications - Renally dose medications    HTN BP currently WNL.  On Carvedilol 6.25 BID and Amlodipine 5 qd mg at home. - Cntinue home BP meds - Continue to monitor BP's  A.Fib On Carvedilol 6.25 BID and Eliquis 5 mg BID.  Signs of A-flutter on EKG on admission.  Received Diltiazem 10 mg dose.  Repeat EKG and bedside monitor shows normal sinus. - Cardiac telemetry monitoring - Continue home  medications   T2DM On Levemir 28U and Novolog 5U with meals at home.  Last A1C in 12/21 6.7.   - Repeat A1C - Levemir 28U daily, titrate as needed - resistant SSI while on steroids  - Monitor CBGs   PAD  Denies any current claudication.  Cap refill <2.  On Eliquis 5 mg BID. - Continue home Eliquis  Social Issues  Has trouble paying for Trelegy and Nebulizer machine. Battery issues for portable oxygen tank.  - TOC consult for medication assistance    FEN/GI: Carb controlled/Heart Healthy Diet Prophylaxis: Eliquis  Disposition: Med/Tele  History of Present Illness:  Kyle [name] is a 70 y.o. male presenting with shortness of breath.  Indicates this has been going on for 3 days.  [facility] and [facility] found to be in 5.  Indicates has also had dry cough over this time period, and has not had sputum production.  Denies any fevers, chest pain, headaches, or n/v/d.  Has not been able to afford Trelegy and never got Duoneb nebulizer machine.  Also has issue with not only transporting portable O2 but also indicates battery dies [place].  Indicates all the rest of medications as prescribed. ED RN reports pt having difficulty paying for Eliquis.   Lasix started last week for left LE swelling. Has been urinating more since starting this medication.  Indicates swelling has gone down.  Patient prescribed Keppra.  Patient indicates he does not recall being prescribed this and is not taking.     Review Of Systems: Per HPI with the following additions:   Review of Systems  Constitutional:  Negative for fever.  HENT:  Negative for rhinorrhea and sore throat.   Respiratory:  Positive for cough (dry) and shortness of breath.   Cardiovascular:  Positive for leg swelling (left). Negative for chest pain.  Gastrointestinal:  Negative for abdominal pain, nausea and vomiting.  Genitourinary:  Negative for decreased urine volume and dysuria.  Musculoskeletal:  Negative for  arthralgias.  Skin:  Negative for rash.  Neurological:  Positive for dizziness. Negative for syncope.  All other systems reviewed and are negative.   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/20/2020   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[place]) 02/20/2020   Hyperosmolar hyperglycemic state (HHS) ([place]) 02/19/2020   CKD (chronic kidney disease) stage 4, GFR 15-29 ml/min (HCC) 02/19/2020   Abnormal findings on diagnostic imaging of lung 01/29/2020   Anticoagulant long-term use 01/29/2020   Heart failure ([place]) 01/20/2020   SOB (shortness of breath)    Uremia 12/24/2019   Atrial fibrillation, chronic ([place]) 12/24/2019   Diabetes mellitus type 2, insulin dependent ([place]) 12/24/2019   Hyperkalemia, diminished renal excretion 12/24/2019   Healthcare maintenance 07/14/2019   Medication management 07/14/2019   Surgical wound dehiscence, initial encounter 05/11/2019   PAD (  peripheral artery disease) ([place]) 04/22/2019   Atherosclerosis of artery of extremity with rest pain ([place]) 04/21/2019   Deformity of metatarsal bone of right foot 04/17/2019   COPD with chronic bronchitis and emphysema ([place]) 12/16/2018   Chronic respiratory failure with hypoxia ([place]) 12/16/2018   Acute renal failure (HCC)    Elevated PSA 12/27/2017   Abnormal EKG 12/21/2017   Protein-calorie malnutrition, severe ([place]) 12/18/2014   Aortoiliac occlusive disease ([place]) 12/16/2014   PVD (peripheral vascular disease) ([place]) 10/24/2014   Aortic stenosis 09/12/2014   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   Pulmonary hypertension ([place]) 09/12/2014   Dyslipidemia 09/12/2014   Atherosclerosis of native arteries of extremity with intermittent claudication ([place]) 05/11/2012   Aortic insufficiency    Hypertension    Peripheral arterial disease ([place])    Dilated cardiomyopathy ([place])    Chronic systolic CHF (congestive heart failure) ([place])     Past Medical  History: Past Medical History:  Diagnosis Date   Aortic insufficiency    MODERATE WITH A BICUSPID AORTIC VAVLE   Arterial occlusive disease    MULTILEVEL   CHF (congestive heart failure) (HCC)    COPD (chronic obstructive pulmonary disease) ([place])    Dilated cardiomyopathy ([place])    WITH EJECTION FRACTION DOWN TO 20-25%--WITH CONGESTIVE HEART FAILURE   Edema    LOWER EXTREMETIES   Hyperglycemia 01/2020   Hypertension    Normal coronary arteries 2009   Orthopnea    Peripheral arterial disease ([place])    Pulmonary hypertension (HCC)    SOB (shortness of breath)     Past Surgical History: Past Surgical History:  Procedure Laterality Date   ABDOMINAL AORTAGRAM N/A 11/05/2014   Procedure: ABDOMINAL Maxcine Ham;  Surgeon: Angelia Mould, MD;  Location: [facility] CATH LAB;  Service: Cardiovascular;  Laterality: N/A;   ABDOMINAL AORTOGRAM W/LOWER EXTREMITY Bilateral 04/21/2019   Procedure: ABDOMINAL AORTOGRAM W/LOWER EXTREMITY;  Surgeon: Angelia Mould, MD;  Location: [place] CV LAB;  Service: Cardiovascular;  Laterality: Bilateral;   AORTA - BILATERAL FEMORAL ARTERY BYPASS GRAFT N/A 12/18/2014   Procedure: AORTOBIFEMORAL BYPASS GRAFT;  Surgeon: Angelia Mould, MD;  Location: [facility];  Service: Vascular;  Laterality: N/A;   CARDIAC CATHETERIZATION  2009   Nl Cors, EF 20%   COLONOSCOPY     ENDARTERECTOMY FEMORAL Left 12/18/2014   Procedure: ENDARTERECTOMY FEMORAL;  Surgeon: Angelia Mould, MD;  Location: [place];  Service: Vascular;  Laterality: Left;   FALSE ANEURYSM REPAIR Right 04/24/2019   Procedure: REPAIR OF RIGHT FEMORAL ARTERY PSEUDOANEURYSM;  Surgeon: Rosetta Posner, MD;  Location: [place];  Service: Vascular;  Laterality: Right;   FEMORAL-POPLITEAL BYPASS GRAFT  02/21/2011   right   FEMORAL-TIBIAL BYPASS GRAFT Right 04/24/2019   Procedure: REDO BYPASS GRAFT RIGHT FEMORAL-TIBIAL ARTERY USING LEFT LEG VEIN & HEMASHIELD GOLD 70m GRAFT;  Surgeon: ERosetta Posner MD;   [place]  Service: Vascular;  Laterality: Right;   IR FLUORO GUIDE CV LINE RIGHT  12/28/2019   IR REMOVAL TUN CV CATH W/O FL  01/11/2020   IR [place] VASC ACCESS RIGHT  12/28/2019   OTHER SURGICAL HISTORY     POST RIGHT FEMOROPOPLITEAL BYPASS GRAFT   PERIPHERAL VASCULAR CATHETERIZATION  11/05/2014   Procedure: LOWER EXTREMITY ANGIOGRAPHY;  Surgeon: CAngelia Mould MD;  [facility];  Service: Cardiovascular;;   VEIN HARVEST Left 04/24/2019   Procedure: LEFT LEG SAPHENOUS VEIN HARVEST;  Surgeon: ERosetta Posner MD;  [place] [facility] [place]  Service: Vascular;  Laterality: Left;    Social History: Social History   Tobacco Use   Smoking status: Former    Packs/day: 1.00    Years: 40.00    Pack years: 40.00    Types: Cigarettes    Quit date: 10/15/2018    Years since quitting: 2.4   Smokeless tobacco: Never   Tobacco comments:    quit 2010 and quit again 2019  Vaping Use   Vaping Use: Never used  Substance Use Topics   Alcohol use: No    Alcohol/week: 0.0 standard drinks   Drug use: No   Additional social history:  Quit smoking about 5 years ago. Previously smoked <1 ppd. Denies illicit drug use.   Please also refer to relevant sections of EMR.  Family History: Family History  Problem Relation Age of Onset   Diabetes Mother    Hyperlipidemia Sister    Hypertension Sister     Allergies and Medications: No Known Allergies No current facility-administered medications on file prior to encounter.   Current Outpatient Medications on File Prior to Encounter  Medication Sig Dispense Refill   amLODipine (NORVASC) 5 MG tablet TAKE 1 TABLET EVERY DAY (Patient taking differently: Take 5 mg by mouth in the morning.) 90 tablet 3   carvedilol (COREG) 6.25 MG tablet Take 1 tablet (6.25 mg total) by mouth 2 (two) times daily with a meal. 60 tablet 1   ELIQUIS 5 MG TABS tablet TAKE 1 TABLET TWICE DAILY (Patient taking differently: Take 5 mg by mouth 2 (two) times daily.) 180  tablet 1   Fluticasone-Umeclidin-Vilant (TRELEGY ELLIPTA) 100-62.5-25 MCG/INH AEPB Inhale 1 puff into the lungs daily. 60 each 5   furosemide (LASIX) 20 MG tablet Take 20 mg daily for 3 days only then stop (Patient taking differently: Take 20 mg by mouth See admin instructions. Take 20 mg by mouth in the morning or as otherwise instructed by Dr. [place]) 30 tablet 0   guaiFENesin (MUCINEX) 600 MG 12 hr tablet Take 2 tablets (1,200 mg total) by mouth 2 (two) times daily. (Patient taking differently: Take 600 mg by mouth 2 (two) times daily.) 30 tablet 0   insulin aspart (NOVOLOG) 100 UNIT/ML FlexPen Inject 3 Units into the skin 3 (three) times daily with meals. (Patient taking differently: Inject 5 Units into the skin 3 (three) times daily with meals.) 15 mL 0   insulin detemir (LEVEMIR) 100 unit/ml SOLN Inject 0.25 mLs (25 Units total) into the skin daily. (Patient taking differently: Inject 28 Units into the skin in the morning.) 8 mL 0   ketoconazole (NIZORAL) 2 % cream Apply to both feet and between toes once daily for 6 weeks. (Patient taking differently: Apply 1 application topically See admin instructions. Apply to both feet and between toes once daily for 6 weeks.) 60 g 0   levETIRAcetam (KEPPRA) 500 MG tablet Take 500 mg by mouth in the morning.     Multiple Vitamins-Minerals (CENTRUM SILVER 50+MEN) TABS Take 1 tablet by mouth daily with breakfast.     OXYGEN Inhale 4.5 L/min into the lungs continuous.     rosuvastatin (CRESTOR) 10 MG tablet Take 1 tablet (10 mg total) by mouth daily at 6 PM. (Patient taking differently: Take 10 mg by mouth at bedtime.) 90 tablet 0   ACCU-CHEK FASTCLIX LANCETS MISC      albuterol (PROVENTIL) (2.5 MG/3ML) 0.083% nebulizer solution Take 3 mLs (2.5 mg total) by nebulization every 6 (six) hours as needed for wheezing or shortness of breath. (  Patient not taking: No sig reported) 75 mL 3   carvedilol (COREG) 12.5 MG tablet Take 12.5 mg by mouth in the morning. (Patient  not taking: No sig reported)     Fluticasone-Umeclidin-Vilant (TRELEGY ELLIPTA) 100-62.5-25 MCG/INH AEPB Inhale 1 puff into the lungs daily. (Patient not taking: No sig reported) 2 each 0   glucose blood (ACCU-CHEK GUIDE) test strip one strip (1 each dose) by Other route 2 (two) times daily. Test blood glucose 2 times a day.     Insulin Pen Needle 32G X 4 MM MISC Use as directed with insulin pen     Multiple Vitamin (MULTIVITAMIN WITH MINERALS) TABS tablet Take 1 tablet by mouth in the morning. Centrum (Patient not taking: No sig reported)      Objective: BP 134/71   Pulse 68   Temp (!) 97.5 F (36.4 C)   Resp (!) 27   Ht '5\' 10"'$  (1.778 m)   Wt 72.6 kg   SpO2 93%   BMI 22.97 kg/m  Exam:  Physical Exam Constitutional:      General: He is not in acute distress.    Appearance: He is well-developed. He is not diaphoretic.  HENT:     Head: Normocephalic and atraumatic.  Cardiovascular:     Rate and Rhythm: Normal rate and regular rhythm.     Heart sounds: Murmur heard.     Comments: Radial pulses 2+ Pulmonary:     Effort: Pulmonary effort is normal.     Breath sounds: Examination of the right-lower field reveals rales. Examination of the left-lower field reveals rales. Rales present.     Comments: Breathing comfortably on 6L [place] Abdominal:     General: Bowel sounds are normal.     Palpations: Abdomen is soft. There is no mass.     Tenderness: There is no abdominal tenderness.  Musculoskeletal:     Cervical back: Normal range of motion.     Right lower leg: No tenderness. Edema present.     Left lower leg: No tenderness. Edema present.     Comments: 1+ pitting edema bilaterally  Skin:    General: Skin is warm and dry.     Capillary Refill: Capillary refill takes less than 2 seconds.     Comments: Linear surgical scars on b/l LE s/p recannulization   Neurological:     General: No focal deficit present.     Mental Status: He is alert and oriented to person, place, and time.   Psychiatric:        Mood and Affect: Mood normal.        Behavior: Behavior normal.     Labs and Imaging: CBC BMET  Recent Labs  Lab 04/07/21 1100 04/07/21 1108  WBC 13.7*  --   HGB 14.9 16.3  HCT 47.3 48.0  PLT 207  --    Recent Labs  Lab 04/07/21 1100 04/07/21 1108  NA 138 140  K 4.3 4.2  CL 105 105  CO2 22  --   BUN 27* 28*  CREATININE 2.29* 2.30*  GLUCOSE 225* 221*  CALCIUM 8.6*  --      EKG: Normal sinus rhthym, left axis deviation    Delora Fuel, MD 04/07/2021, 2:01 PM PGY-1, [place] Intern pager: [phone], text pages welcome  FPTS Upper-Level Resident Addendum   I have independently interviewed and examined the patient. I have discussed the above with the original author and agree with their documentation. My edits for correction/addition/clarification are  within the document. Please see also any attending notes.   Kyle Hensen, DO PGY-2, [place] [facility] 04/07/2021 5:51 PM  [place] Service pager: [phone] (text pages welcome through [facility])

## 2021-04-07 NOTE — Telephone Encounter (Signed)
Spoke to patient's wife she stated husband is presently being admitted to [facility] hospital with sob.Stated he did not take Trelegy for over 1 week.Stated he could not afford to buy.Advised I hope he feels better soon.

## 2021-04-07 NOTE — Telephone Encounter (Signed)
Called patient's wife no answer.Left message on personal voice mail to call back.

## 2021-04-07 NOTE — ED Notes (Signed)
Relayed patient's calls re mask to RN's each time.

## 2021-04-07 NOTE — ED Triage Notes (Addendum)
[facility], non productive cough x1 week Rapid covid test was negative.  [facility], placed on 4L [place] increased to 95% A-Fib 160 no hx Received '10mg'$  Cardizem IVP rate 80 A-fib   On arrival to ED 71% RA to ED   Pt has not had his Trelegy x1 week d/t cost    BP 93/55 HR 74 RR 18 CBG 270  99.1  20g LLW

## 2021-04-07 NOTE — Progress Notes (Signed)
Noted that patient had drop in oxygen level to 80's on 7L [place] and was eventually bumped to nonrebreather.  Admitting was not made aware of increase in oxygen demand.  Chest xray ordered by ED provider shows vascular congestion and bibasilar atelectasis.  Messaged primary RN to give 1730 dose of Lasix.  Intern has now gone to assess patient. Will order ABG now May need to initiate BiPAP over night.  Carollee Leitz, MD Family Medicine Residency

## 2021-04-07 NOTE — Telephone Encounter (Signed)
[name] returning call.

## 2021-04-08 ENCOUNTER — Observation Stay (HOSPITAL_COMMUNITY): Payer: Medicare HMO

## 2021-04-08 ENCOUNTER — Other Ambulatory Visit (HOSPITAL_COMMUNITY): Payer: Self-pay

## 2021-04-08 DIAGNOSIS — Z9114 Patient's other noncompliance with medication regimen: Secondary | ICD-10-CM | POA: Diagnosis not present

## 2021-04-08 DIAGNOSIS — Z79899 Other long term (current) drug therapy: Secondary | ICD-10-CM | POA: Diagnosis not present

## 2021-04-08 DIAGNOSIS — E1122 Type 2 diabetes mellitus with diabetic chronic kidney disease: Secondary | ICD-10-CM | POA: Diagnosis present

## 2021-04-08 DIAGNOSIS — I472 Ventricular tachycardia: Secondary | ICD-10-CM | POA: Diagnosis not present

## 2021-04-08 DIAGNOSIS — Z8249 Family history of ischemic heart disease and other diseases of the circulatory system: Secondary | ICD-10-CM | POA: Diagnosis not present

## 2021-04-08 DIAGNOSIS — I130 Hypertensive heart and chronic kidney disease with heart failure and stage 1 through stage 4 chronic kidney disease, or unspecified chronic kidney disease: Secondary | ICD-10-CM | POA: Diagnosis present

## 2021-04-08 DIAGNOSIS — I4892 Unspecified atrial flutter: Secondary | ICD-10-CM | POA: Diagnosis present

## 2021-04-08 DIAGNOSIS — I482 Chronic atrial fibrillation, unspecified: Secondary | ICD-10-CM | POA: Diagnosis not present

## 2021-04-08 DIAGNOSIS — I5043 Acute on chronic combined systolic (congestive) and diastolic (congestive) heart failure: Secondary | ICD-10-CM

## 2021-04-08 DIAGNOSIS — N179 Acute kidney failure, unspecified: Secondary | ICD-10-CM | POA: Diagnosis present

## 2021-04-08 DIAGNOSIS — R0902 Hypoxemia: Secondary | ICD-10-CM | POA: Diagnosis present

## 2021-04-08 DIAGNOSIS — I352 Nonrheumatic aortic (valve) stenosis with insufficiency: Secondary | ICD-10-CM | POA: Diagnosis present

## 2021-04-08 DIAGNOSIS — J9621 Acute and chronic respiratory failure with hypoxia: Secondary | ICD-10-CM | POA: Diagnosis present

## 2021-04-08 DIAGNOSIS — I5023 Acute on chronic systolic (congestive) heart failure: Secondary | ICD-10-CM | POA: Diagnosis not present

## 2021-04-08 DIAGNOSIS — J439 Emphysema, unspecified: Secondary | ICD-10-CM | POA: Diagnosis present

## 2021-04-08 DIAGNOSIS — Z794 Long term (current) use of insulin: Secondary | ICD-10-CM | POA: Diagnosis not present

## 2021-04-08 DIAGNOSIS — E119 Type 2 diabetes mellitus without complications: Secondary | ICD-10-CM | POA: Diagnosis not present

## 2021-04-08 DIAGNOSIS — Z7901 Long term (current) use of anticoagulants: Secondary | ICD-10-CM | POA: Diagnosis not present

## 2021-04-08 DIAGNOSIS — E785 Hyperlipidemia, unspecified: Secondary | ICD-10-CM | POA: Diagnosis present

## 2021-04-08 DIAGNOSIS — J441 Chronic obstructive pulmonary disease with (acute) exacerbation: Secondary | ICD-10-CM | POA: Diagnosis not present

## 2021-04-08 DIAGNOSIS — Z87891 Personal history of nicotine dependence: Secondary | ICD-10-CM | POA: Diagnosis not present

## 2021-04-08 DIAGNOSIS — Z20822 Contact with and (suspected) exposure to covid-19: Secondary | ICD-10-CM | POA: Diagnosis present

## 2021-04-08 DIAGNOSIS — Z9981 Dependence on supplemental oxygen: Secondary | ICD-10-CM | POA: Diagnosis not present

## 2021-04-08 DIAGNOSIS — Z833 Family history of diabetes mellitus: Secondary | ICD-10-CM | POA: Diagnosis not present

## 2021-04-08 DIAGNOSIS — I739 Peripheral vascular disease, unspecified: Secondary | ICD-10-CM | POA: Diagnosis not present

## 2021-04-08 DIAGNOSIS — E1151 Type 2 diabetes mellitus with diabetic peripheral angiopathy without gangrene: Secondary | ICD-10-CM | POA: Diagnosis present

## 2021-04-08 DIAGNOSIS — I420 Dilated cardiomyopathy: Secondary | ICD-10-CM | POA: Diagnosis present

## 2021-04-08 DIAGNOSIS — N184 Chronic kidney disease, stage 4 (severe): Secondary | ICD-10-CM | POA: Diagnosis present

## 2021-04-08 DIAGNOSIS — E875 Hyperkalemia: Secondary | ICD-10-CM | POA: Diagnosis not present

## 2021-04-08 DIAGNOSIS — I272 Pulmonary hypertension, unspecified: Secondary | ICD-10-CM | POA: Diagnosis present

## 2021-04-08 LAB — ECHOCARDIOGRAM COMPLETE
AR max vel: 1.17 cm2
AV Area VTI: 1.33 cm2
AV Area mean vel: 1.31 cm2
AV Mean grad: 15 mmHg
AV Peak grad: 27.7 mmHg
Ao pk vel: 2.63 m/s
Height: 70 in
P 1/2 time: 595 msec
S' Lateral: 3.1 cm
Weight: 2560.86 oz

## 2021-04-08 LAB — BASIC METABOLIC PANEL
Anion gap: 7 (ref 5–15)
BUN: 37 mg/dL — ABNORMAL HIGH (ref 8–23)
CO2: 20 mmol/L — ABNORMAL LOW (ref 22–32)
Calcium: 8.1 mg/dL — ABNORMAL LOW (ref 8.9–10.3)
Chloride: 110 mmol/L (ref 98–111)
Creatinine, Ser: 2.21 mg/dL — ABNORMAL HIGH (ref 0.61–1.24)
GFR, Estimated: 31 mL/min — ABNORMAL LOW (ref >60)
Glucose, Bld: 195 mg/dL — ABNORMAL HIGH (ref 70–99)
Potassium: 4.5 mmol/L (ref 3.5–5.1)
Sodium: 137 mmol/L (ref 135–145)

## 2021-04-08 LAB — MAGNESIUM: Magnesium: 2.1 mg/dL (ref 1.7–2.4)

## 2021-04-08 LAB — CBC WITH DIFFERENTIAL/PLATELET
Abs Immature Granulocytes: 0.09 10*3/uL — ABNORMAL HIGH (ref 0.00–0.07)
Basophils Absolute: 0 10*3/uL (ref 0.0–0.1)
Basophils Relative: 0 %
Eosinophils Absolute: 0 10*3/uL (ref 0.0–0.5)
Eosinophils Relative: 0 %
HCT: 45.5 % (ref 39.0–52.0)
Hemoglobin: 14.4 g/dL (ref 13.0–17.0)
Immature Granulocytes: 1 %
Lymphocytes Relative: 4 %
Lymphs Abs: 0.6 10*3/uL — ABNORMAL LOW (ref 0.7–4.0)
MCH: 30.4 pg (ref 26.0–34.0)
MCHC: 31.6 g/dL (ref 30.0–36.0)
MCV: 96.2 fL (ref 80.0–100.0)
Monocytes Absolute: 1.4 10*3/uL — ABNORMAL HIGH (ref 0.1–1.0)
Monocytes Relative: 9 %
Neutro Abs: 13.5 10*3/uL — ABNORMAL HIGH (ref 1.7–7.7)
Neutrophils Relative %: 86 %
Platelets: 205 10*3/uL (ref 150–400)
RBC: 4.73 MIL/uL (ref 4.22–5.81)
RDW: 17.1 % — ABNORMAL HIGH (ref 11.5–15.5)
WBC: 15.6 10*3/uL — ABNORMAL HIGH (ref 4.0–10.5)
nRBC: 0.6 % — ABNORMAL HIGH (ref 0.0–0.2)

## 2021-04-08 LAB — GLUCOSE, CAPILLARY
Glucose-Capillary: 175 mg/dL — ABNORMAL HIGH (ref 70–99)
Glucose-Capillary: 205 mg/dL — ABNORMAL HIGH (ref 70–99)

## 2021-04-08 LAB — HEMOGLOBIN A1C
Hgb A1c MFr Bld: 7 % — ABNORMAL HIGH (ref 4.8–5.6)
Mean Plasma Glucose: 154 mg/dL

## 2021-04-08 LAB — CBG MONITORING, ED
Glucose-Capillary: 159 mg/dL — ABNORMAL HIGH (ref 70–99)
Glucose-Capillary: 252 mg/dL — ABNORMAL HIGH (ref 70–99)

## 2021-04-08 LAB — TSH: TSH: 0.382 u[IU]/mL (ref 0.350–4.500)

## 2021-04-08 MED ORDER — INSULIN DETEMIR 100 UNIT/ML ~~LOC~~ SOLN
28.0000 [IU] | Freq: Every day | SUBCUTANEOUS | Status: DC
  Administered 2021-04-09 – 2021-04-12 (×4): 28 [IU] via SUBCUTANEOUS
  Filled 2021-04-08 (×4): qty 0.28

## 2021-04-08 MED ORDER — FUROSEMIDE 10 MG/ML IJ SOLN
40.0000 mg | Freq: Once | INTRAMUSCULAR | Status: AC
  Administered 2021-04-08: 40 mg via INTRAVENOUS
  Filled 2021-04-08: qty 4

## 2021-04-08 MED ORDER — INSULIN DETEMIR 100 UNIT/ML ~~LOC~~ SOLN
14.0000 [IU] | Freq: Once | SUBCUTANEOUS | Status: AC
  Administered 2021-04-08: 14 [IU] via SUBCUTANEOUS
  Filled 2021-04-08 (×2): qty 0.14

## 2021-04-08 MED ORDER — ISOSORBIDE MONONITRATE ER 30 MG PO TB24
15.0000 mg | ORAL_TABLET | Freq: Every day | ORAL | Status: DC
  Administered 2021-04-08 – 2021-04-10 (×3): 15 mg via ORAL
  Filled 2021-04-08 (×3): qty 1

## 2021-04-08 NOTE — Progress Notes (Signed)
FPTS Interim Progress Note  Patient sleeping and resting comfortably.  Currently on 6L oxygen, baseline 4.5L.  Oxygen saturations 94%-98%.  Urine output 500 cc after receiving Lasix 40 mg IV.  Rounded with primary RN, reports no increased work of breathing.   No orders required.  Appreciated nightly round.  Today's Vitals   04/08/21 0230 04/08/21 0245 04/08/21 0300 04/08/21 0315  BP: 135/71 136/69 138/74 130/74  Pulse: 69 65 66 (!) 43  Resp: '15 15 15 15  '$ Temp:      TempSrc:      SpO2: 93% 93% 95% 98%  Weight:      Height:      PainSc:       Recent ABG wnl Can use humidified air with High flow N/C 6L for comfort Reassess Lasix dose in am. Maintain O2 sats >88% Monitor respiratory status Continue to wean oxygen as needed  Carollee Leitz, MD 04/08/2021, 3:37 AM PGY-2, [place] Medicine Service pager [phone]

## 2021-04-08 NOTE — Progress Notes (Signed)
Family Medicine Teaching Service Daily Progress Note Intern Pager: [phone]  Patient name: Kyle [name] Medical record number: [id number] Date of birth: 07-11-1951 Age: 70 y.o. Gender: male  Primary Care Provider: Alonna Buckler, MD Consultants: Cards Code Status: Full  Pt Overview and Major Events to Date:  6/13- Admitted  Assessment and Plan: [name] is a 70 y.o. male presenting with dyspnea. PMH is significant for atrial fibrillation anticoagulant Eliquis, HTN, HLD, CKD, CHF, COPD on 4-1/2 L nasal cannula.    Acute Hypoxic Respiratory Failure  Patient no longer has lung carackles on exam and fluid in legs drecreased.  Still on 6L O2 following  ECHO shows 405-45% EF, down from 60-65% in 2021.  Moderate Aortic regurg and mild to moderate aortic stenosis same as previous ECHO.  Consulted Cards given worsening HF and Valve issues.  Follow up reccomendations for diurhesis.   - Cards following, appreciate recs - Cards recs for diuresis - Continie COPD management/ exacerbation treatment  AKI on CKD stage 3a Cr- 2.3>2.21.  Received total 60 IV Lasix yesterday.   - follow up Cards recs for diurhesis  FEN/GI:Carb modified/Heart healthy diet PPx: Lovenox   Status is: Observation  The patient will require care spanning > 2 midnights and should be moved to inpatient because: Hemodynamically unstable  Dispo: The patient is from: Home              Anticipated d/c is to: Home              Patient currently is not medically stable to d/c.   Difficult to place patient No   Subjective:  Patient indicates breathing improved this AM.  Objective: Temp:  [97.8 F (36.6 C)] 97.8 F (36.6 C) (06/13 1636) Pulse Rate:  [43-75] 69 (06/14 1103) Resp:  [13-27] 19 (06/14 1103) BP: (103-156)/(53-90) 142/80 (06/14 1103) SpO2:  [90 %-100 %] 91 % (06/14 1103) Physical Exam:  Physical Exam Constitutional:      General: He is not in acute distress.    Appearance: He is not  ill-appearing.  HENT:     Head: Normocephalic and atraumatic.  Cardiovascular:     Rate and Rhythm: Normal rate and regular rhythm.     Pulses: No decreased pulses.     Heart sounds: No murmur heard. Pulmonary:     Breath sounds: No decreased breath sounds.  Musculoskeletal:     Right lower leg: No edema.     Left lower leg: No edema.  Neurological:     Mental Status: He is alert.     Laboratory: Recent Labs  Lab 04/07/21 1100 04/07/21 1108 04/07/21 2149 04/08/21 0500  WBC 13.7*  --   --  15.6*  HGB 14.9 16.3 15.3 14.4  HCT 47.3 48.0 45.0 45.5  PLT 207  --   --  205   Recent Labs  Lab 04/07/21 1100 04/07/21 1108 04/07/21 2149 04/08/21 0650  NA 138 140 137 137  K 4.3 4.2 4.2 4.5  CL 105 105  --  110  CO2 22  --   --  20*  BUN 27* 28*  --  37*  CREATININE 2.29* 2.30*  --  2.21*  CALCIUM 8.6*  --   --  8.1*  PROT 6.1*  --   --   --   BILITOT 1.0  --   --   --   ALKPHOS 87  --   --   --   ALT 41  --   --   --  AST 32  --   --   --   GLUCOSE 225* 221*  --  195*      Imaging/Diagnostic Tests:  ECHOCARDIOGRAM REPORT         Patient Name:   Kyle [name] Date of Exam: 04/08/2021  Medical Rec #:  [id number]      Height:       70.0 in  Accession #:    [id number]     Weight:       160.1 lb  Date of Birth:  06/14/1951      BSA:          1.899 m  Patient Age:    17 years       BP:           148/64 mmHg  Patient Gender: M              HR:           62 bpm.  Exam Location:  Inpatient   Procedure: 2D Echo, Color Doppler and Cardiac Doppler   STAT ECHO   Indications:    I50.9* Heart failure (unspecified)     History:        Patient has prior history of Echocardiogram examinations,  most                  recent 12/13/2019. CHF, COPD, Arrythmias:Atrial  Fibrillation;                  Risk Factors:Diabetes, Hypertension and Dyslipidemia.     Sonographer:    Raquel Sarna Senior RDCS  Referring Phys: [phone] CARINA M BROWN   IMPRESSIONS     1. Hypokinesis of the  inferolateral wall with overall mild LV  dysfunction.   2. Left ventricular ejection fraction, by estimation, is 45 to 50%. The  left ventricle has mildly decreased function. The left ventricle  demonstrates regional wall motion abnormalities (see scoring  diagram/findings for description). There is moderate  left ventricular hypertrophy. Left ventricular diastolic parameters are  consistent with Grade I diastolic dysfunction (impaired relaxation).   3. Right ventricular systolic function is normal. The right ventricular  size is normal. There is moderately elevated pulmonary artery systolic  pressure.   4. Left atrial size was mildly dilated.   5. Right atrial size was mildly dilated.   6. The mitral valve is normal in structure. Mild mitral valve  regurgitation. No evidence of mitral stenosis.   7. The aortic valve is normal in structure. Aortic valve regurgitation is  moderate. Mild to moderate aortic valve stenosis.   8. The inferior vena cava is dilated in size with >50% respiratory  variability, suggesting right atrial pressure of 8 mmHg.   FINDINGS   Left Ventricle: Left ventricular ejection fraction, by estimation, is 45  to 50%. The left ventricle has mildly decreased function. The left  ventricle demonstrates regional wall motion abnormalities. The left  ventricular internal cavity size was normal  in size. There is moderate left ventricular hypertrophy. Left ventricular  diastolic parameters are consistent with Grade I diastolic dysfunction  (impaired relaxation).   Right Ventricle: The right ventricular size is normal. Right ventricular  systolic function is normal. There is moderately elevated pulmonary artery  systolic pressure. The tricuspid regurgitant velocity is 3.16 m/s, and  with an assumed right atrial  pressure of 15 mmHg, the estimated right ventricular systolic pressure is  123456 mmHg.   Left Atrium: Left atrial size was mildly dilated.  Right Atrium:  Right atrial size was mildly dilated.   Pericardium: There is no evidence of pericardial effusion.   Mitral Valve: The mitral valve is normal in structure. Mild mitral annular  calcification. Mild mitral valve regurgitation. No evidence of mitral  valve stenosis.   Tricuspid Valve: The tricuspid valve is normal in structure. Tricuspid  valve regurgitation is mild . No evidence of tricuspid stenosis.   Aortic Valve: The aortic valve is normal in structure. Aortic valve  regurgitation is moderate. Aortic regurgitation PHT measures 595 msec.  Mild to moderate aortic stenosis is present. Aortic valve mean gradient  measures 15.0 mmHg. Aortic valve peak  gradient measures 27.7 mmHg. Aortic valve area, by VTI measures 1.33 cm.   Pulmonic Valve: The pulmonic valve was not well visualized. Pulmonic valve  regurgitation is not visualized. No evidence of pulmonic stenosis.   Aorta: The aortic root is normal in size and structure.   Venous: The inferior vena cava is dilated in size with greater than 50%  respiratory variability, suggesting right atrial pressure of 8 mmHg.   IAS/Shunts: No atrial level shunt detected by color flow Doppler.   Additional Comments: Hypokinesis of the inferolateral wall with overall  mild LV dysfunction.      LEFT VENTRICLE  PLAX 2D  LVIDd:         4.60 cm  LVIDs:         3.10 cm  LV PW:         1.20 cm  LV IVS:        1.40 cm  LVOT diam:     2.10 cm  LV SV:         78  LV SV Index:   41  LVOT Area:     3.46 cm      RIGHT VENTRICLE  RV S prime:     12.50 cm/s  TAPSE (M-mode): 1.8 cm   LEFT ATRIUM             Index       RIGHT ATRIUM           Index  LA diam:        3.60 cm 1.90 cm/m  RA Area:     24.20 cm  LA Vol (A2C):   57.5 ml 30.28 ml/m RA Volume:   77.90 ml  41.03 ml/m  LA Vol (A4C):   32.9 ml 17.33 ml/m  LA Biplane Vol: 44.3 ml 23.33 ml/m   AORTIC VALVE  AV Area (Vmax):    1.17 cm  AV Area (Vmean):   1.31 cm  AV Area (VTI):      1.33 cm  AV Vmax:           263.00 cm/s  AV Vmean:          185.000 cm/s  AV VTI:            0.582 m  AV Peak Grad:      27.7 mmHg  AV Mean Grad:      15.0 mmHg  LVOT Vmax:         89.15 cm/s  LVOT Vmean:        69.900 cm/s  LVOT VTI:          0.224 m  LVOT/AV VTI ratio: 0.38  AI PHT:            595 msec     AORTA  Ao Root diam: 2.90 cm   TRICUSPID VALVE  TR Peak grad:   39.9 mmHg  TR Vmax:        316.00 cm/s     SHUNTS  Systemic VTI:  0.22 m  Systemic Diam: 2.10 cm   Kirk Ruths MD  Electronically signed by Kirk Ruths MD  Signature Date/Time: 04/08/2021/9:16:49 AM   Delora Fuel, MD 04/08/2021, 1:22 PM PGY-1, [place] Intern pager: [phone], text pages welcome

## 2021-04-08 NOTE — Progress Notes (Signed)
RT assessed pt. Again. Pt. Tolerating well. Bipap still not needed at this time. Pt. Is resting comfortably. No distress noted. RT to monitor. ABG is documented. MD aware.

## 2021-04-08 NOTE — TOC Benefit Eligibility Note (Signed)
Patient Teacher, English as a foreign language completed.    The patient is currently admitted and upon discharge could be taking Breo Ellipta '100mg'$ .  The current 30 day co-pay is, $102.75 due to Coverage Gap (donut hole).   The patient is currently admitted and upon discharge could be taking Symbicort '80mg'$ .  Non Forumulary  The patient is currently admitted and upon discharge could be taking Dulera '100mg'$ .  Non Forumulary  The patient is currently admitted and upon discharge could be taking Incruse Ellipta 62.5 mcg.  Non Forumulary  The patient is currently admitted and upon discharge could be taking Spirvia 18 mcg.  The current 30 day co-pay is, $133.06 due to Coverage Gap (donut hole).    The patient is insured through [place], [employer] Patient Advocate Specialist [place] Team Direct Number: [phone]  Fax: [phone]

## 2021-04-08 NOTE — ED Notes (Signed)
Patient transferred to hospital.

## 2021-04-08 NOTE — Progress Notes (Signed)
Echocardiogram 2D Echocardiogram has been performed.  Oneal Deputy Sladen Plancarte 04/08/2021, 9:09 AM

## 2021-04-08 NOTE — Consult Note (Addendum)
Cardiology Consultation:   Patient ID: Kyle [name] MRN: [id number]; DOB: [birth year]  Admit date: 04/07/2021 Date of Consult: 04/08/2021  PCP:  Alonna Buckler, MD   [facility] Providers Cardiologist:  Peter Martinique, MD   {  Patient Profile:   Kyle [name] is a 70 y.o. 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 Gwendlyn Deutscher.  History of Present Illness:   Mr. Teichert is a 70 year old male with the above history who is followed by Dr. Martinique. Patient has a long history of non-ischemic cardiomyopathy/chronic systolic CHF. Initially diagnosed in 2012 when found to have an EF of 20-25%. Cardiac catheterization at that time showed normal coronaries. He was started on GDMT and EF eventually normalized. Last Echo in 11/2019 showed LVEF of 65-70% with normal wall motion, grade 1 diastolic dysfunction, moderate AI, mild to moderate AS, and moderate pulmonary hypertension with RVSP of 63.2 mmHg. It does not look like patient has had any repeat ischemic evaluation since cardiac cath in 2012. Patient was last seen by Dr. Martinique in 11/2020 at which time he was doing well from a cardiac standpoint. He also has known severe PAD and follows with Vascular Surgery. He has had multiple interventions in the past. Most recent lower extremity dopplers in  02/2021 showed left ABI of 0.49 and right ABI of 0.80 and ultrasound was concerning for >70% stenosis of the distal anastomosis in the mid calf area. [name] recommended peripheral angiography to further define this area but patient wished to discuss this with wife.  Patient presented to the ED yesterday for further evaluation of shortness of breath. Patient states he was in his usual state of health until he ran out of his Trelegy inhaler about 1 week ago due to not being able to afford this. He then started to develop significant shortness of breath and a non-productive. Shortness of breath has occurred both at rest and with exertion. He denies any orthopnea and reports off and on PND. Of note, he has severe COPD and is on 4.5 L of O2 at home 24/7. He notes some mild lower extremity edema of his left leg for which he was recently prescribed Lasix $RemoveBeforeDE'20mg'ThraArPaJUYGlyi$  x3 days. Edema has improved with the Lasix. He also notes some abdominal bloating. No chest pain. No palpitations, lightheadedness, dizziness, or near syncope. No other recent fevers or illnesses. No GI symptoms. No abnormal bleeding in urine or stools.  In the ED, patient was hypoxic with O2 sat of 71% on room air. He was placed on NRB with improvement. EKG showed atrial flutter, with rate of 113 bpm. High-sensitivity troponin 99 >> 88. BNP markedly elevated at 1,899. Initial chest x-ray showed chronic interstitial parenchymal change and emphysema but no acute findings. No overt edema. Repeat chest x-ray showed cardiomegaly with vascular congestion and bibasilar atelectasis but again no overt edema. WBC 13.7, Hgb 14.9, Plts 207.  Na 138, K 4.3, Glucose 225, BUN 27, Cr 2.29. Albumin 2.7, AST 32, ALT 41, Alk Phos 87, Total Bili 1.0. COVID-19 negative. Patient admitted for acute hypoxic respiratory failure felt to be secondary to untreated COPD and CHF exacerbation. Cardiology consulted for further evaluation.  At the time of this evaluation, patient is resting  comfortably. He is back in sinus rhythm. He denied any palpitations or lightheadedness/dizziness when in atrial flutter with RVR. He is off NRB mask and on 6 L of nasal cannula with O2 sats.  Past Medical History:  Diagnosis Date   Aortic insufficiency    MODERATE WITH A BICUSPID AORTIC VAVLE   Arterial occlusive disease    MULTILEVEL   CHF (congestive heart failure) (HCC)    COPD (chronic obstructive pulmonary disease) ([place])    Dilated cardiomyopathy ([place])    WITH EJECTION FRACTION DOWN TO 20-25%--WITH CONGESTIVE HEART FAILURE   Edema    LOWER EXTREMETIES   Hyperglycemia 01/2020   Hypertension    Normal coronary arteries 2009   Orthopnea    Peripheral arterial disease ([place])    Pulmonary hypertension ([place])     Past Surgical History:  Procedure Laterality Date   ABDOMINAL AORTAGRAM N/A 11/05/2014   Procedure: ABDOMINAL Maxcine Ham;  Surgeon: Angelia Mould, MD;  Location: [facility] CATH LAB;  Service: Cardiovascular;  Laterality: N/A;   ABDOMINAL AORTOGRAM W/LOWER EXTREMITY Bilateral 04/21/2019   Procedure: ABDOMINAL AORTOGRAM W/LOWER EXTREMITY;  Surgeon: Angelia Mould, MD;  Location: [facility] CV LAB;  Service: Cardiovascular;  Laterality: Bilateral;   AORTA - BILATERAL FEMORAL ARTERY BYPASS GRAFT N/A 12/18/2014   Procedure: AORTOBIFEMORAL BYPASS GRAFT;  Surgeon: Angelia Mould, MD;  Location: [place];  Service: Vascular;  Laterality: N/A;   CARDIAC CATHETERIZATION  2009   Nl Cors, EF 20%   COLONOSCOPY     ENDARTERECTOMY FEMORAL Left 12/18/2014   Procedure: ENDARTERECTOMY FEMORAL;  Surgeon: Angelia Mould, MD;  Location: [facility];  Service: Vascular;  Laterality: Left;   FALSE ANEURYSM REPAIR Right 04/24/2019   Procedure: REPAIR OF RIGHT FEMORAL ARTERY PSEUDOANEURYSM;  Surgeon: Rosetta Posner, MD;  Location: [place];  Service: Vascular;  Laterality: Right;   FEMORAL-POPLITEAL BYPASS GRAFT  02/21/2011   right   FEMORAL-TIBIAL BYPASS GRAFT Right 04/24/2019   Procedure:  REDO BYPASS GRAFT RIGHT FEMORAL-TIBIAL ARTERY USING LEFT LEG VEIN & HEMASHIELD GOLD 30mm GRAFT;  Surgeon: Rosetta Posner, MD;  Location: [facility];  Service: Vascular;  Laterality: Right;   IR FLUORO GUIDE CV LINE RIGHT  12/28/2019   IR REMOVAL TUN CV CATH W/O FL  01/11/2020   IR US GUIDE VASC ACCESS RIGHT  12/28/2019   OTHER SURGICAL HISTORY     POST RIGHT FEMOROPOPLITEAL BYPASS GRAFT   PERIPHERAL VASCULAR CATHETERIZATION  11/05/2014   Procedure: LOWER EXTREMITY ANGIOGRAPHY;  Surgeon: Angelia Mould, MD;  Location: [facility] CATH LAB;  Service: Cardiovascular;;   VEIN HARVEST Left 04/24/2019   Procedure: LEFT LEG SAPHENOUS VEIN HARVEST;  Surgeon: Rosetta Posner, MD;  Location: [facility] OR;  Service: Vascular;  Laterality: Left;     Home Medications:  Prior to Admission medications   Medication Sig Start Date End Date Taking? Authorizing Provider  amLODipine (NORVASC) 5 MG tablet TAKE 1 TABLET EVERY DAY Patient taking differently: Take 5 mg by mouth in the morning. 01/06/21  Yes Martinique, Peter M, MD  carvedilol (COREG) 6.25 MG tablet Take 1 tablet (6.25 mg total) by mouth 2 (two) times daily with a meal. 01/11/20  04/07/21 Yes Kc, Maren Beach, MD  ELIQUIS 5 MG TABS tablet TAKE 1 TABLET TWICE DAILY Patient taking differently: Take 5 mg by mouth 2 (two) times daily. 01/30/21  Yes Martinique, Peter M, MD  Fluticasone-Umeclidin-Vilant (TRELEGY ELLIPTA) 100-62.5-25 MCG/INH AEPB Inhale 1 puff into the lungs daily. 03/05/21  Yes Parrett, Tammy S, NP  furosemide (LASIX) 20 MG tablet Take 20 mg daily for 3 days only then stop Patient taking differently: Take 20 mg by mouth See admin instructions. Take 20 mg by mouth in the morning or as otherwise instructed by Dr. Martinique 04/01/21  Yes Martinique, Peter M, MD  guaiFENesin (MUCINEX) 600 MG 12 hr tablet Take 2 tablets (1,200 mg total) by mouth 2 (two) times daily. Patient taking differently: Take 600 mg by mouth 2 (two) times daily. 11/30/18  Yes Lavina Hamman, MD  insulin aspart (NOVOLOG) 100  UNIT/ML FlexPen Inject 3 Units into the skin 3 (three) times daily with meals. Patient taking differently: Inject 5 Units into the skin 3 (three) times daily with meals. 02/23/20  Yes Allie Bossier, MD  insulin detemir (LEVEMIR) 100 unit/ml SOLN Inject 0.25 mLs (25 Units total) into the skin daily. Patient taking differently: Inject 28 Units into the skin in the morning. 02/23/20  Yes Allie Bossier, MD  ketoconazole (NIZORAL) 2 % cream Apply to both feet and between toes once daily for 6 weeks. Patient taking differently: Apply 1 application topically See admin instructions. Apply to both feet and between toes once daily for 6 weeks. 03/18/21  Yes Marzetta Board, DPM  levETIRAcetam (KEPPRA) 500 MG tablet Take 500 mg by mouth in the morning. 02/12/21  Yes [provider]  Multiple Vitamins-Minerals (CENTRUM SILVER 50+MEN) TABS Take 1 tablet by mouth daily with breakfast.   Yes [provider]  OXYGEN Inhale 4.5 L/min into the lungs continuous.   Yes [provider]  rosuvastatin (CRESTOR) 10 MG tablet Take 1 tablet (10 mg total) by mouth daily at 6 PM. Patient taking differently: Take 10 mg by mouth at bedtime. 01/24/20  Yes Hoyt Koch, MD  ACCU-CHEK FASTCLIX LANCETS MISC  07/30/18   [provider]  albuterol (PROVENTIL) (2.5 MG/3ML) 0.083% nebulizer solution Take 3 mLs (2.5 mg total) by nebulization every 6 (six) hours as needed for wheezing or shortness of breath. Patient not taking: No sig reported 03/11/20   Rigoberto Noel, MD  glucose blood (ACCU-CHEK GUIDE) test strip one strip (1 each dose) by Other route 2 (two) times daily. Test blood glucose 2 times a day. 11/21/19   [provider]  Insulin Pen Needle 32G X 4 MM MISC Use as directed with insulin pen 03/01/20   [provider]    Inpatient Medications: Scheduled Meds:  amLODipine  5 mg Oral Daily   apixaban  5 mg Oral BID   azithromycin  500 mg Oral Daily   budesonide  (PULMICORT) nebulizer solution  0.25 mg Nebulization BID   carvedilol  6.25 mg Oral BID WC   guaiFENesin  600 mg Oral BID   insulin aspart  0-20 Units Subcutaneous TID WC   insulin detemir  14 Units Subcutaneous Daily   ipratropium-albuterol  3 mL Nebulization Q6H   predniSONE  40 mg Oral Q breakfast   rosuvastatin  10 mg Oral q1800   Continuous Infusions:  PRN Meds: acetaminophen **OR** acetaminophen, albuterol  Allergies:   No Known Allergies  Social History:   Social History   Socioeconomic History  Marital status: Married    Spouse name: Not on file   Number of children: 1   Years of education: Not on file   Highest education level: Not on file  Occupational History   Occupation: retired    Fish farm manager: [employer]  Tobacco Use   Smoking status: Former    Packs/day: 1.00    Years: 40.00    Pack years: 40.00    Types: Cigarettes    Quit date: 10/15/2018    Years since quitting: 2.4   Smokeless tobacco: Never   Tobacco comments:    quit 2010 and quit again 2019  Vaping Use   Vaping Use: Never used  Substance and Sexual Activity   Alcohol use: No    Alcohol/week: 0.0 standard drinks   Drug use: No   Sexual activity: Not on file  Other Topics Concern   Not on file  Social History Narrative   Not on file   Social Determinants of Health   Financial Resource Strain: Not on file  Food Insecurity: Not on file  Transportation Needs: Not on file  Physical Activity: Not on file  Stress: Not on file  Social Connections: Not on file  Intimate Partner Violence: Not on file    Family History:    Family History  Problem Relation Age of Onset   Diabetes Mother    Hyperlipidemia Sister    Hypertension Sister      ROS:  Please see the history of present illness.  Review of Systems  Constitutional:  Negative for chills and fever.  HENT:  Negative for congestion.   Respiratory:  Positive for cough and shortness of breath. Negative for sputum production.    Cardiovascular:  Positive for leg swelling and PND. Negative for chest pain, palpitations and orthopnea.  Gastrointestinal:  Negative for blood in stool, melena, nausea and vomiting.  Genitourinary:  Negative for hematuria.  Musculoskeletal:  Negative for myalgias.  Neurological:  Negative for dizziness and loss of consciousness.  Endo/Heme/Allergies:  Does not bruise/bleed easily.  Psychiatric/Behavioral:  Substance abuse: former tobacco abuse.    Physical Exam/Data:   Vitals:   04/08/21 1103 04/08/21 1200 04/08/21 1230 04/08/21 1300  BP: (!) 142/80 (!) 128/58 129/63 128/67  Pulse: 69 60 62 72  Resp: $Remo'19 16 18 16  'GrBDJ$ Temp:      TempSrc:      SpO2: 91% 94% 96% 94%  Weight:      Height:        Intake/Output Summary (Last 24 hours) at 04/08/2021 1458 Last data filed at 04/08/2021 0150 Gross per 24 hour  Intake --  Output 500 ml  Net -500 ml   Last 3 Weights 04/07/2021 03/05/2021 03/05/2021  Weight (lbs) 160 lb 0.9 oz 160 lb 161 lb 9.6 oz  Weight (kg) 72.6 kg 72.576 kg 73.301 kg     Body mass index is 22.97 kg/m.  General: 70 y.o. thin African-American male resting comfortably in no acute distress.  HEENT: Normocephalic and atraumatic. Sclera clear.  Neck: Supple. No carotid bruits. No JVD. Heart: RRR. Distinct S1 and S2. No significant murmurs, gallops, or rubs. Radial pulses 2+ and equal bilaterally. Lungs: No significant increased work of breathing. Decreased breath sounds throughout. Very faint crackles noted in bilateral bases.  Abdomen: Soft but mildly distended. Non-tender to palpation. Bowel sounds present. Extremities: Trace lower extremity edema bilaterally (left > right mostly around ankle).    Skin: Warm and dry. Neuro: Alert and oriented x3. No focal deficits. Psych:  Normal affect. Responds appropriately.   EKG:  The EKG was personally reviewed and demonstrates: Atrial flutter with rate of 113 bpm. Difficult to assess for ST/T changes due to flutter waves. Possible Q  waves in lead III. Left axis deviation. LAFB.   Telemetry:  Telemetry was personally reviewed and demonstrates: Initially in atrial flutter with rates as high as the 130s but converted to sinus rhythm shortly after 12:20pm. PVCs noted as well as one 24 beat run of non-sustained VT.  Relevant CV Studies:  Echocardiogram 04/08/2021: Impressions:  1. Hypokinesis of the inferolateral wall with overall mild LV  dysfunction.   2. Left ventricular ejection fraction, by estimation, is 45 to 50%. The  left ventricle has mildly decreased function. The left ventricle  demonstrates regional wall motion abnormalities (see scoring  diagram/findings for description). There is moderate  left ventricular hypertrophy. Left ventricular diastolic parameters are  consistent with Grade I diastolic dysfunction (impaired relaxation).   3. Right ventricular systolic function is normal. The right ventricular  size is normal. There is moderately elevated pulmonary artery systolic  pressure.   4. Left atrial size was mildly dilated.   5. Right atrial size was mildly dilated.   6. The mitral valve is normal in structure. Mild mitral valve  regurgitation. No evidence of mitral stenosis.   7. The aortic valve is normal in structure. Aortic valve regurgitation is  moderate. Mild to moderate aortic valve stenosis.   8. The inferior vena cava is dilated in size with >50% respiratory  variability, suggesting right atrial pressure of 8 mmHg.   Laboratory Data:  High Sensitivity Troponin:   Recent Labs  Lab 04/07/21 1100 04/07/21 1229  TROPONINIHS 99* 88*     Chemistry Recent Labs  Lab 04/07/21 1100 04/07/21 1108 04/07/21 2149 04/08/21 0650  NA 138 140 137 137  K 4.3 4.2 4.2 4.5  CL 105 105  --  110  CO2 22  --   --  20*  GLUCOSE 225* 221*  --  195*  BUN 27* 28*  --  37*  CREATININE 2.29* 2.30*  --  2.21*  CALCIUM 8.6*  --   --  8.1*  GFRNONAA 30*  --   --  31*  ANIONGAP 11  --   --  7    Recent  Labs  Lab 04/07/21 1100  PROT 6.1*  ALBUMIN 2.7*  AST 32  ALT 41  ALKPHOS 87  BILITOT 1.0   Hematology Recent Labs  Lab 04/07/21 1100 04/07/21 1108 04/07/21 2149 04/08/21 0500  WBC 13.7*  --   --  15.6*  RBC 4.87  --   --  4.73  HGB 14.9 16.3 15.3 14.4  HCT 47.3 48.0 45.0 45.5  MCV 97.1  --   --  96.2  MCH 30.6  --   --  30.4  MCHC 31.5  --   --  31.6  RDW 17.8*  --   --  17.1*  PLT 207  --   --  205   BNP Recent Labs  Lab 04/07/21 1100  BNP 1,899.5*    DDimer No results for input(s): DDIMER in the last 168 hours.   Radiology/Studies:  DG Chest Port 1 View  Result Date: 04/07/2021 CLINICAL DATA:  Worsening oxygenation EXAM: PORTABLE CHEST 1 VIEW COMPARISON:  04/07/2021 FINDINGS: Cardiomegaly, vascular congestion. Bibasilar atelectasis. No overt edema or effusions. No acute bony abnormality. IMPRESSION: Cardiomegaly, vascular congestion and bibasilar atelectasis. Electronically Signed   By: Rolm Baptise  M.D.   On: 04/07/2021 19:48   DG Chest Portable 1 View  Result Date: 04/07/2021 CLINICAL DATA:  Shortness of breath EXAM: PORTABLE CHEST 1 VIEW COMPARISON:  None. FINDINGS: Unchanged, enlarged cardiac silhouette. There are chronic interstitial changes without new focal airspace consolidation. No large pleural effusion or visible pneumothorax. Emphysema. No acute osseous abnormality. Bilateral shoulder degenerative changes. IMPRESSION: Chronic interstitial parenchymal change and emphysema. No new focal airspace consolidation. Unchanged, enlarged cardiac silhouette. Electronically Signed   By: Maurine Simmering   On: 04/07/2021 10:50   ECHOCARDIOGRAM COMPLETE  Result Date: 04/08/2021    ECHOCARDIOGRAM REPORT   Patient Name:   Kyle [name] Date of Exam: 04/08/2021 Medical Rec #:  [id number]      Height:       70.0 in Accession #:    [id number]     Weight:       160.1 lb Date of Birth:  Oct 06, 1951      BSA:          1.899 m Patient Age:    76 years       BP:           148/64 mmHg  Patient Gender: M              HR:           62 bpm. Exam Location:  Inpatient Procedure: 2D Echo, Color Doppler and Cardiac Doppler STAT ECHO Indications:    I50.9* Heart failure (unspecified)  History:        Patient has prior history of Echocardiogram examinations, most                 recent 12/13/2019. CHF, COPD, Arrythmias:Atrial Fibrillation;                 Risk Factors:Diabetes, Hypertension and Dyslipidemia.  Sonographer:    Raquel Sarna Senior RDCS Referring Phys: [phone] CARINA M BROWN IMPRESSIONS  1. Hypokinesis of the inferolateral wall with overall mild LV dysfunction.  2. Left ventricular ejection fraction, by estimation, is 45 to 50%. The left ventricle has mildly decreased function. The left ventricle demonstrates regional wall motion abnormalities (see scoring diagram/findings for description). There is moderate left ventricular hypertrophy. Left ventricular diastolic parameters are consistent with Grade I diastolic dysfunction (impaired relaxation).  3. Right ventricular systolic function is normal. The right ventricular size is normal. There is moderately elevated pulmonary artery systolic pressure.  4. Left atrial size was mildly dilated.  5. Right atrial size was mildly dilated.  6. The mitral valve is normal in structure. Mild mitral valve regurgitation. No evidence of mitral stenosis.  7. The aortic valve is normal in structure. Aortic valve regurgitation is moderate. Mild to moderate aortic valve stenosis.  8. The inferior vena cava is dilated in size with >50% respiratory variability, suggesting right atrial pressure of 8 mmHg. FINDINGS  Left Ventricle: Left ventricular ejection fraction, by estimation, is 45 to 50%. The left ventricle has mildly decreased function. The left ventricle demonstrates regional wall motion abnormalities. The left ventricular internal cavity size was normal in size. There is moderate left ventricular hypertrophy. Left ventricular diastolic parameters are consistent with  Grade I diastolic dysfunction (impaired relaxation). Right Ventricle: The right ventricular size is normal. Right ventricular systolic function is normal. There is moderately elevated pulmonary artery systolic pressure. The tricuspid regurgitant velocity is 3.16 m/s, and with an assumed right atrial pressure of 15 mmHg, the estimated right ventricular systolic pressure is 39.7 mmHg. Left Atrium: Left  atrial size was mildly dilated. Right Atrium: Right atrial size was mildly dilated. Pericardium: There is no evidence of pericardial effusion. Mitral Valve: The mitral valve is normal in structure. Mild mitral annular calcification. Mild mitral valve regurgitation. No evidence of mitral valve stenosis. Tricuspid Valve: The tricuspid valve is normal in structure. Tricuspid valve regurgitation is mild . No evidence of tricuspid stenosis. Aortic Valve: The aortic valve is normal in structure. Aortic valve regurgitation is moderate. Aortic regurgitation PHT measures 595 msec. Mild to moderate aortic stenosis is present. Aortic valve mean gradient measures 15.0 mmHg. Aortic valve peak gradient measures 27.7 mmHg. Aortic valve area, by VTI measures 1.33 cm. Pulmonic Valve: The pulmonic valve was not well visualized. Pulmonic valve regurgitation is not visualized. No evidence of pulmonic stenosis. Aorta: The aortic root is normal in size and structure. Venous: The inferior vena cava is dilated in size with greater than 50% respiratory variability, suggesting right atrial pressure of 8 mmHg. IAS/Shunts: No atrial level shunt detected by color flow Doppler. Additional Comments: Hypokinesis of the inferolateral wall with overall mild LV dysfunction.  LEFT VENTRICLE PLAX 2D LVIDd:         4.60 cm LVIDs:         3.10 cm LV PW:         1.20 cm LV IVS:        1.40 cm LVOT diam:     2.10 cm LV SV:         78 LV SV Index:   41 LVOT Area:     3.46 cm  RIGHT VENTRICLE RV S prime:     12.50 cm/s TAPSE (M-mode): 1.8 cm LEFT ATRIUM              Index       RIGHT ATRIUM           Index LA diam:        3.60 cm 1.90 cm/m  RA Area:     24.20 cm LA Vol (A2C):   57.5 ml 30.28 ml/m RA Volume:   77.90 ml  41.03 ml/m LA Vol (A4C):   32.9 ml 17.33 ml/m LA Biplane Vol: 44.3 ml 23.33 ml/m  AORTIC VALVE AV Area (Vmax):    1.17 cm AV Area (Vmean):   1.31 cm AV Area (VTI):     1.33 cm AV Vmax:           263.00 cm/s AV Vmean:          185.000 cm/s AV VTI:            0.582 m AV Peak Grad:      27.7 mmHg AV Mean Grad:      15.0 mmHg LVOT Vmax:         89.15 cm/s LVOT Vmean:        69.900 cm/s LVOT VTI:          0.224 m LVOT/AV VTI ratio: 0.38 AI PHT:            595 msec  AORTA Ao Root diam: 2.90 cm TRICUSPID VALVE TR Peak grad:   39.9 mmHg TR Vmax:        316.00 cm/s  SHUNTS Systemic VTI:  0.22 m Systemic Diam: 2.10 cm Kirk Ruths MD Electronically signed by Kirk Ruths MD Signature Date/Time: 04/08/2021/9:16:49 AM    Final    VAS [place] LOWER EXTREMITY VENOUS (DVT) ([facility] and [facility] 7a-7p)  Result Date: 04/07/2021  Lower Venous DVT Study Patient Name:  Kyle [name]  Date of Exam:   04/07/2021 Medical Rec #: [id number]       Accession #:    [id number] Date of Birth: May 04, 1951       Patient Gender: M Patient Age:   70Y Exam Location:  [facility] Procedure:      VAS [place] LOWER EXTREMITY VENOUS (DVT) Referring Phys: [id number] Alvin Critchley [place] --------------------------------------------------------------------------------  Indications: Swelling, and Edema.  Comparison Study: no prior Performing Technologist: Archie Patten RVS  Examination Guidelines: A complete evaluation includes B-mode imaging, spectral Doppler, color Doppler, and power Doppler as needed of all accessible portions of each vessel. Bilateral testing is considered an integral part of a complete examination. Limited examinations for reoccurring indications may be performed as noted. The reflux portion of the exam is performed with the patient in reverse Trendelenburg.   +-----+---------------+---------+-----------+----------+--------------+ RIGHTCompressibilityPhasicitySpontaneityPropertiesThrombus Aging +-----+---------------+---------+-----------+----------+--------------+ CFV  Full           Yes      Yes                                 +-----+---------------+---------+-----------+----------+--------------+   +---------+---------------+---------+-----------+----------+--------------+ LEFT     CompressibilityPhasicitySpontaneityPropertiesThrombus Aging +---------+---------------+---------+-----------+----------+--------------+ CFV      Full           Yes      Yes                                 +---------+---------------+---------+-----------+----------+--------------+ SFJ      Full                                                        +---------+---------------+---------+-----------+----------+--------------+ FV Prox  Full                                                        +---------+---------------+---------+-----------+----------+--------------+ FV Mid   Full                                                        +---------+---------------+---------+-----------+----------+--------------+ FV DistalFull                                                        +---------+---------------+---------+-----------+----------+--------------+ PFV      Full                                                        +---------+---------------+---------+-----------+----------+--------------+ POP      Full           Yes      Yes                                 +---------+---------------+---------+-----------+----------+--------------+  PTV      Full                                                        +---------+---------------+---------+-----------+----------+--------------+ PERO     Full                                                         +---------+---------------+---------+-----------+----------+--------------+     Summary: RIGHT: - No evidence of common femoral vein obstruction.  LEFT: - There is no evidence of deep vein thrombosis in the lower extremity.  - No cystic structure found in the popliteal fossa.  *See table(s) above for measurements and observations. Electronically signed by Servando Snare MD on 04/07/2021 at 1:22:15 PM.    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 O2 sats initially in the 70s on room air. - Likely secondary to COPD exacerbation with some CHF component as well. Patient has severe COPD and is on 4.5 L of O2 at home 24/7.  - COPD being treated with breathing treatments, steroids, and Zithromax. - Recommendation for CHF below. Otherwise, per primary team.   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  - Chest x-ray showed cardiomegaly with some vascular congestion but no overt edema.  - Echo showed LVEF of 45-50% with hypokinesis of the inferolateral wall, grade 1 diastolic dysfunction, and moderately elevated PASP. EF down from 65-70% in 11/2019 and wall motion abnormality is new. - He has received a total of $Remove'60mg'iQUvQhW$  of IV Lasix in the ED. Only 500cc of documented urinary output but unsure if this is accurate. - Will give another dose of IV Lasix $Remove'40mg'RBeRaWU$  and see how he helps with this. - No ACEi/ARB/ARNI or MRA due to renal function.  - Continue Coreg 6.$RemoveBeforeDE'25mg'VnWnIszZpYVRlBM$  twice daily. - Will start Imdur $RemoveBef'15mg'DQxcolofNg$  daily. If BP tolerate this OK, can then Hydralazine. - Continue to monitor daily weights, strict I/Os, and renal function. - Patient would benefit from repeat ischemic evaluation at some point with reduced EF and new wall motion abnormality but will hold off at this time given renal  function.  New Onset Atrial Flutter Paroxysmal Atrial Fibrillation - Patient has a history of atrial fibrillation and presented to the ED in atrial flutter with RVR. Converted back to sinus rhythm with spontaneously. - Potassium and Magnesium normal.  - Will check TSH. - Continue Coreg 6.$RemoveBeforeDE'25mg'JVexCSEvySfhmvP$  twice daily. - Continue chronic anticoagulation with Eliquis $RemoveBefo'5mg'jkdQWvQKLWy$  twice daily.   Non-Sustained VT - Telemetry showed 24 beat run of non-sustained VT. - Potassium and Magnesium normal.  - Continue Coreg 6.$RemoveBeforeDE'25mg'YtFwHsBstEWOscR$  twice daily. - Continue to monitor on telemetry.  Elevated Troponin - High-sensitivity troponin 99 >> 88. - Will repeat EKG now that patient is back in sinus rhythm. - Echo as above. - Patient denies any chest pain.  - Suspect troponin elevation is due to demand ischemia from acute hypoxic respiratory failure and atrial flutter with RVR. However,  Echo did show decrease in EF and new wall motion abnormality. Patient has multiple CV risk factor and very well could have CAD. Given patient is asymptomatic, would not perform cardiac catheterization or coronary CTA candidate at this time given renal function. Also not a good Lexiscan Myoview candidate at this time given severe COPD with current exacerbation.  Hypertension - BP mildly elevated at times but currently well controlled. - Continue Coreg 6.$RemoveBeforeDE'25mg'axtgUxijNWxIRVl$  twice daily. - Will add Imdur $RemoveB'15mg'jwsghhgL$  daily. - Will stop Amlodipine so that we can optimize GDMT for CHF.  Hyperlipidemia - Last lipid panel in 11/2019 showed LDL of 101. - LDL goal <70 given severe PAD.  - Will continue home Crestor $RemoveBefo'10mg'FvtKAnjCRYq$  daily for now. - Will repeat fasting lipid panel in the morning. If LDL still above goal, will increase Crestor.  Type 2 Diabetes  - Hemoglobin A1c 7.0 this admission. - Management per primary team.  PAD - History of severe PAD s/p multiple interventions.  - Recent lower extremity dopplers in 02/2021 showed left ABI of 0.49 and right ABI of 0.80 and  ultrasound was concerning for >70% stenosis of the distal anastomosis in the mid calf area. [name] recommended peripheral angiography to further define this area but patient wished to discuss this with wife. - No claudication - Continue statin.  - Follow-up with Vascular Surgery as outpatient.  Acute on CKD Stage III - Creatinine 2.29 on admission and stable at 2.21 today. Baseline 1.4 to 1.8.  - Continue to monitor closely with diuresis.   Risk Assessment/Risk Scores:   New York Heart Association (NYHA) Functional Class NYHA Class IV. However, current respiratory distress also due to COPD exacerbation.  CHA2DS2-VASc Score = 5  This indicates a 7.2% annual risk of stroke. The patient's score is based upon: CHF History: Yes HTN History: Yes Diabetes History: Yes Stroke History: No Vascular Disease History: Yes Age Score: 1 Gender Score: 0   For questions or updates, please contact [place] Please consult [email or web address] for contact info under    Signed, Eppie Gibson  04/08/2021 2:58 PM   Patient seen and examined and agree with Sande Rives, PA-C as detailed above.  In brief, the patient is a 70 y.o. male with a history of non-ischemic cardiomyopathy/chronic systolic CHF with EF as low as 20-25% in 2012 but improved to 65-70% on Echo in in 11/2019, normal coronary arteries on cardiac catheterization in 2012, paroxysmal atrial fibrillation on Eliquis, bicuspid aortic valve aortic stenosis/insufficiency, severe PAD s/p aortobifemoral bypass graff in 11/2014 and repair of right femoral artery pseudoaneurysm as well as right femoral to peroneal artery bypass in 03/2019, COPD with pulmonary hypertension on chronic O2 followed by Pulmonology, hypertension, hyperlipidemia, type 2 diabetes mellitus, CKD stage III, and former tobacco abuse who presented with worsening SOB after running out of his Trelegy inhaler found to be very hypoxic on arrival requiring NRB.  Cardiology is consulted for acute on chronic heart failure exacerbation.  Suspect patient's acute decompensation was driven by hypoxia which likely triggered vasoconstriction and Afib with RVR leading to vascular congestion and volume overload. BNP elevated at 1899. Was placed on NRB on arrival and diuresed with improvement of symptoms. Converted back to NSR and now on 6L [place]. TTE on admission with LVEF 45-50% which is slightly decreased from prior at 65-70% with inferolateral wall hypokinesis. No anginal symptoms currently or prior to running out of his inhaler, although does have significant risk factors for developing CAD since last cath in 2012.  Cr notably 2.2 at this time; will likely need ischemic work-up once more stable from renal and COPD standpoint on an out-patient basis.  Exam: GEN: No acute distress.   Neck: No JVD Cardiac: RRR, no murmurs, rubs, or gallops.  Respiratory: Diminished with faint crackles at the bases GI: Soft, nontender, non-distended  MS: Trace L>R LE edema Neuro:  Nonfocal  Psych: Normal affect    Plan: -Give lasix $RemoveBef'40mg'pDhZGBJoSS$  IV and monitor response and Cr -Will likely need ischemic work-up as out-patient for drop in EF and WMA described above; not a candidate for lexiscan as in-patient due to acute COPD exacerbation and would like to avoid cath with AKI currently -Continue coreg 6.$RemoveBeforeDEI'25mg'vkYTosDwkLognNki$  BID  -GDMT limited due to renal dysfunction -Start imdur $RemoveBefo'15mg'eCoSzdDrDCO$  daily; can add hydral as BP allows -Continue apixaban for [facility] -Monitor I/Os and daily standing weights -Management of COPD and acute on chronic hypoxic respiratory failure per primary  Gwyndolyn Kaufman, MD

## 2021-04-08 NOTE — Evaluation (Signed)
Physical Therapy Evaluation Patient Details Name: Kyle [name] MRN: [id number] DOB: 02-05-1951 Today's Date: 04/08/2021   History of Present Illness  Pt is a 70 y/o male admitted secondary to increased SOB. Found to have acute respiratory failure, likely from CHF and COPD exacerbation. PMH includes PAD, a fib, DM, and COPD on 4.5 L.  Clinical Impression  Pt presenting with problem above with deficits below. Pt with mild unsteadiness initially, requiring min A for steadying. Was able to progress to min guard A for safety as mobility progressed. Oxygen sats initially at 94% on 6L, decreased to 88% on 6L during ambulation and required extended seated rest and pursed lip breathing to return to 91% on 6L. DOE at 1/4. Anticipate pt will progress well and will not require follow up PT services. Would benefit from portable oxygen tank to use when out of house. Will continue to follow acutely.     Follow Up Recommendations No PT follow up    Equipment Recommendations  None recommended by PT    Recommendations for Other Services       Precautions / Restrictions Precautions Precautions: Other (comment) Precaution Comments: Watch O2 sats Restrictions Weight Bearing Restrictions: No      Mobility  Bed Mobility Overal bed mobility: Modified Independent                  Transfers Overall transfer level: Needs assistance Equipment used: None Transfers: Sit to/from Stand Sit to Stand: Min guard;+2 safety/equipment         General transfer comment: Min guard for safety.  Ambulation/Gait Ambulation/Gait assistance: Min assist;Min guard;+2 safety/equipment Gait Distance (Feet): 15 Feet Assistive device: None Gait Pattern/deviations: Step-through pattern;Decreased stride length;Staggering right Gait velocity: Decreased   General Gait Details: Pt initially unsteady requiring min A for steadying assist, but was able to progress to min guard A for safety. DOE at 1/4 and oxygen sats  decreasing to 88% on 6L. Required extended time and pursed lip breathing to return to 9091% on 6L.  Stairs            Wheelchair Mobility    Modified Rankin (Stroke Patients Only)       Balance Overall balance assessment: Mild deficits observed, not formally tested                                           Pertinent Vitals/Pain Pain Assessment: No/denies pain    Home Living Family/patient expects to be discharged to:: Private residence Living Arrangements: Spouse/significant other Available Help at Discharge: Family Type of Home: Apartment Home Access: Stairs to enter Entrance Stairs-Rails: Right;Left;Can reach both Technical brewer of Steps: 15 Home Layout: One level Home Equipment: None Additional Comments: Reports he uses 4.5 L of oxygen at home but does not use at work because his machine is "too heavy"    Prior Function Level of Independence: Independent         Comments: drive; works at [employer] - does not wear O2 while working     Journalist, newspaper   Dominant Hand: Right    Extremity/Trunk Assessment   Upper Extremity Assessment Upper Extremity Assessment: Defer to OT evaluation    Lower Extremity Assessment Lower Extremity Assessment: Overall WFL for tasks assessed    Cervical / Trunk Assessment Cervical / Trunk Assessment: Normal  Communication   Communication: No difficulties  Cognition Arousal/Alertness: Awake/alert  Behavior During Therapy: WFL for tasks assessed/performed Overall Cognitive Status: Within Functional Limits for tasks assessed                                 General Comments: Decreased awareness of deficits as he does not wear oxygen at work      General Comments General comments (skin integrity, edema, etc.): [name] present during session. Oxygen sats initially at 94% on 6L, decreased to 88% on 6L during ambulation and required extended seated rest and pursed lip  breathing to return to 91% on 6L. DOE at [employer]     Assessment/Plan    PT Assessment Patient needs continued PT services  PT Problem List Decreased balance;Decreased activity tolerance;Decreased mobility;Decreased safety awareness;Decreased knowledge of precautions       PT Treatment Interventions Gait training;Stair training;Functional mobility training;Therapeutic activities;Therapeutic exercise;Balance training;Patient/family education    PT Goals (Current goals can be found in the Care Plan section)  Acute Rehab PT Goals Patient Stated Goal: to go home PT Goal Formulation: With patient Time For Goal Achievement: 04/22/21 Potential to Achieve Goals: Fair    Frequency Min 3X/week   Barriers to discharge        Co-evaluation PT/OT/SLP Co-Evaluation/Treatment: Yes Reason for Co-Treatment: To address functional/ADL transfers PT goals addressed during session: Mobility/safety with mobility;Balance         AM-PAC PT "6 Clicks" Mobility  Outcome Measure Help needed turning from your back to your side while in a flat bed without using bedrails?: None Help needed moving from lying on your back to sitting on the side of a flat bed without using bedrails?: None Help needed moving to and from a bed to a chair (including a wheelchair)?: A Little Help needed standing up from a chair using your arms (e.g., wheelchair or bedside chair)?: A Little Help needed to walk in hospital room?: A Little Help needed climbing 3-5 steps with a railing? : A Little 6 Click Score: 20    End of Session Equipment Utilized During Treatment: Gait belt;Oxygen Activity Tolerance: Patient tolerated treatment well Patient left: in bed;with call bell/phone within reach;with family/visitor present (on bed in ED) Nurse Communication: Mobility status PT Visit Diagnosis: Unsteadiness on feet (R26.81)    Time: [id number] PT Time Calculation (min) (ACUTE ONLY): 35 min   Charges:   PT  Evaluation $PT Eval Moderate Complexity: 1 Mod          Reuel Derby, PT, DPT  Acute Rehabilitation Services  Pager: [phone] Office: [phone]   Rudean Hitt 04/08/2021, 2:00 PM

## 2021-04-08 NOTE — Progress Notes (Signed)
Progress Note  Patient Name: Kyle [name] Date of Encounter: 04/09/2021  [facility] Cardiologist: Peter Martinique, MD   Subjective   States his breathing is overall improved. Diuresing well. Continues to require 7L.  Remains in NSR with HR 70-80s BP 130-140s On 7L [place] improving 2.2-->1.93 1300 UOP documented; no ins recorded   Inpatient Medications    Scheduled Meds:  apixaban  5 mg Oral BID   azithromycin  500 mg Oral Daily   budesonide (PULMICORT) nebulizer solution  0.25 mg Nebulization BID   carvedilol  6.25 mg Oral BID WC   guaiFENesin  600 mg Oral BID   insulin aspart  0-20 Units Subcutaneous TID WC   insulin detemir  28 Units Subcutaneous Daily   ipratropium-albuterol  3 mL Nebulization Q6H   isosorbide mononitrate  15 mg Oral Daily   predniSONE  40 mg Oral Q breakfast   rosuvastatin  10 mg Oral q1800   Continuous Infusions:  PRN Meds: acetaminophen **OR** acetaminophen, albuterol   Vital Signs    Vitals:   04/08/21 2041 04/09/21 0216 04/09/21 0505 04/09/21 0525  BP:   (!) 145/75   Pulse:   83   Resp: 20  20   Temp: 98.4 F (36.9 C)  98.7 F (37.1 C)   TempSrc: Axillary  Oral   SpO2:  96% 96%   Weight:    73.7 kg  Height:        Intake/Output Summary (Last 24 hours) at 04/09/2021 0642 Last data filed at 04/09/2021 0320 Gross per 24 hour  Intake --  Output 800 ml  Net -800 ml   Last 3 Weights 04/09/2021 04/07/2021 03/05/2021  Weight (lbs) 162 lb 7.7 oz 160 lb 0.9 oz 160 lb  Weight (kg) 73.7 kg 72.6 kg 72.576 kg      Telemetry    NSR with frequent PVCs, PACs - Personally Reviewed  ECG    NSR, with PACs, LVH HR 84bpm - Personally Reviewed  Physical Exam   GEN: Comfortable, becomes short of breath with speakig  Neck: JVD to angle of mandible Cardiac: RRR, no murmurs, rubs, or gallops.  Respiratory: Diminished throughout. No wheezes GI: Soft, nontender, non-distended  MS: Trace L>R LE edema Neuro:  Nonfocal  Psych: Normal affect    Labs    High Sensitivity Troponin:   Recent Labs  Lab 04/07/21 1100 04/07/21 1229  TROPONINIHS 99* 88*      Chemistry Recent Labs  Lab 04/07/21 1100 04/07/21 1108 04/07/21 2149 04/08/21 0650  NA 138 140 137 137  K 4.3 4.2 4.2 4.5  CL 105 105  --  110  CO2 22  --   --  20*  GLUCOSE 225* 221*  --  195*  BUN 27* 28*  --  37*  CREATININE 2.29* 2.30*  --  2.21*  CALCIUM 8.6*  --   --  8.1*  PROT 6.1*  --   --   --   ALBUMIN 2.7*  --   --   --   AST 32  --   --   --   ALT 41  --   --   --   ALKPHOS 87  --   --   --   BILITOT 1.0  --   --   --   GFRNONAA 30*  --   --  31*  ANIONGAP 11  --   --  7     Hematology Recent Labs  Lab 04/07/21 1100  04/07/21 1108 04/07/21 2149 04/08/21 0500  WBC 13.7*  --   --  15.6*  RBC 4.87  --   --  4.73  HGB 14.9 16.3 15.3 14.4  HCT 47.3 48.0 45.0 45.5  MCV 97.1  --   --  96.2  MCH 30.6  --   --  30.4  MCHC 31.5  --   --  31.6  RDW 17.8*  --   --  17.1*  PLT 207  --   --  205    BNP Recent Labs  Lab 04/07/21 1100  BNP 1,899.5*     DDimer No results for input(s): DDIMER in the last 168 hours.   Radiology    DG Chest Port 1 View  Result Date: 04/07/2021 CLINICAL DATA:  Worsening oxygenation EXAM: PORTABLE CHEST 1 VIEW COMPARISON:  04/07/2021 FINDINGS: Cardiomegaly, vascular congestion. Bibasilar atelectasis. No overt edema or effusions. No acute bony abnormality. IMPRESSION: Cardiomegaly, vascular congestion and bibasilar atelectasis. Electronically Signed   By: Rolm Baptise M.D.   On: 04/07/2021 19:48   DG Chest Portable 1 View  Result Date: 04/07/2021 CLINICAL DATA:  Shortness of breath EXAM: PORTABLE CHEST 1 VIEW COMPARISON:  None. FINDINGS: Unchanged, enlarged cardiac silhouette. There are chronic interstitial changes without new focal airspace consolidation. No large pleural effusion or visible pneumothorax. Emphysema. No acute osseous abnormality. Bilateral shoulder degenerative changes. IMPRESSION: Chronic  interstitial parenchymal change and emphysema. No new focal airspace consolidation. Unchanged, enlarged cardiac silhouette. Electronically Signed   By: Maurine Simmering   On: 04/07/2021 10:50   ECHOCARDIOGRAM COMPLETE  Result Date: 04/08/2021    ECHOCARDIOGRAM REPORT   Patient Name:   Kyle [name] Date of Exam: 04/08/2021 Medical Rec #:  [id number]      Height:       70.0 in Accession #:    [id number]     Weight:       160.1 lb Date of Birth:  [birth year]      BSA:          1.899 m Patient Age:    70 years       BP:           148/64 mmHg Patient Gender: M              HR:           62 bpm. Exam Location:  Inpatient Procedure: 2D Echo, Color Doppler and Cardiac Doppler STAT ECHO Indications:    I50.9* Heart failure (unspecified)  History:        Patient has prior history of Echocardiogram examinations, most                 recent 12/13/2019. CHF, COPD, Arrythmias:Atrial Fibrillation;                 Risk Factors:Diabetes, Hypertension and Dyslipidemia.  Sonographer:    Raquel Sarna Senior RDCS Referring Phys: [phone] CARINA M BROWN IMPRESSIONS  1. Hypokinesis of the inferolateral wall with overall mild LV dysfunction.  2. Left ventricular ejection fraction, by estimation, is 45 to 50%. The left ventricle has mildly decreased function. The left ventricle demonstrates regional wall motion abnormalities (see scoring diagram/findings for description). There is moderate left ventricular hypertrophy. Left ventricular diastolic parameters are consistent with Grade I diastolic dysfunction (impaired relaxation).  3. Right ventricular systolic function is normal. The right ventricular size is normal. There is moderately elevated pulmonary artery systolic pressure.  4. Left atrial size was mildly dilated.  5. Right  atrial size was mildly dilated.  6. The mitral valve is normal in structure. Mild mitral valve regurgitation. No evidence of mitral stenosis.  7. The aortic valve is normal in structure. Aortic valve regurgitation is moderate.  Mild to moderate aortic valve stenosis.  8. The inferior vena cava is dilated in size with >50% respiratory variability, suggesting right atrial pressure of 8 mmHg. FINDINGS  Left Ventricle: Left ventricular ejection fraction, by estimation, is 45 to 50%. The left ventricle has mildly decreased function. The left ventricle demonstrates regional wall motion abnormalities. The left ventricular internal cavity size was normal in size. There is moderate left ventricular hypertrophy. Left ventricular diastolic parameters are consistent with Grade I diastolic dysfunction (impaired relaxation). Right Ventricle: The right ventricular size is normal. Right ventricular systolic function is normal. There is moderately elevated pulmonary artery systolic pressure. The tricuspid regurgitant velocity is 3.16 m/s, and with an assumed right atrial pressure of 15 mmHg, the estimated right ventricular systolic pressure is 123456 mmHg. Left Atrium: Left atrial size was mildly dilated. Right Atrium: Right atrial size was mildly dilated. Pericardium: There is no evidence of pericardial effusion. Mitral Valve: The mitral valve is normal in structure. Mild mitral annular calcification. Mild mitral valve regurgitation. No evidence of mitral valve stenosis. Tricuspid Valve: The tricuspid valve is normal in structure. Tricuspid valve regurgitation is mild . No evidence of tricuspid stenosis. Aortic Valve: The aortic valve is normal in structure. Aortic valve regurgitation is moderate. Aortic regurgitation PHT measures 595 msec. Mild to moderate aortic stenosis is present. Aortic valve mean gradient measures 15.0 mmHg. Aortic valve peak gradient measures 27.7 mmHg. Aortic valve area, by VTI measures 1.33 cm. Pulmonic Valve: The pulmonic valve was not well visualized. Pulmonic valve regurgitation is not visualized. No evidence of pulmonic stenosis. Aorta: The aortic root is normal in size and structure. Venous: The inferior vena cava is dilated  in size with greater than 50% respiratory variability, suggesting right atrial pressure of 8 mmHg. IAS/Shunts: No atrial level shunt detected by color flow Doppler. Additional Comments: Hypokinesis of the inferolateral wall with overall mild LV dysfunction.  LEFT VENTRICLE PLAX 2D LVIDd:         4.60 cm LVIDs:         3.10 cm LV PW:         1.20 cm LV IVS:        1.40 cm LVOT diam:     2.10 cm LV SV:         78 LV SV Index:   41 LVOT Area:     3.46 cm  RIGHT VENTRICLE RV S prime:     12.50 cm/s TAPSE (M-mode): 1.8 cm LEFT ATRIUM             Index       RIGHT ATRIUM           Index LA diam:        3.60 cm 1.90 cm/m  RA Area:     24.20 cm LA Vol (A2C):   57.5 ml 30.28 ml/m RA Volume:   77.90 ml  41.03 ml/m LA Vol (A4C):   32.9 ml 17.33 ml/m LA Biplane Vol: 44.3 ml 23.33 ml/m  AORTIC VALVE AV Area (Vmax):    1.17 cm AV Area (Vmean):   1.31 cm AV Area (VTI):     1.33 cm AV Vmax:           263.00 cm/s AV Vmean:  185.000 cm/s AV VTI:            0.582 m AV Peak Grad:      27.7 mmHg AV Mean Grad:      15.0 mmHg LVOT Vmax:         89.15 cm/s LVOT Vmean:        69.900 cm/s LVOT VTI:          0.224 m LVOT/AV VTI ratio: 0.38 AI PHT:            595 msec  AORTA Ao Root diam: 2.90 cm TRICUSPID VALVE TR Peak grad:   39.9 mmHg TR Vmax:        316.00 cm/s  SHUNTS Systemic VTI:  0.22 m Systemic Diam: 2.10 cm Kirk Ruths MD Electronically signed by Kirk Ruths MD Signature Date/Time: 04/08/2021/9:16:49 AM    Final    VAS [place] LOWER EXTREMITY VENOUS (DVT) (MC and WL 7a-7p)  Result Date: 04/07/2021  Lower Venous DVT Study Patient Name:  Kyle [name]  Date of Exam:   04/07/2021 Medical Rec #: [id number]       Accession #:    [id number] Date of Birth: 04-May-1951       Patient Gender: M Patient Age:   70Y Exam Location:  [facility] Procedure:      VAS [place] LOWER EXTREMITY VENOUS (DVT) Referring Phys: [id number] Alvin Critchley HORTON --------------------------------------------------------------------------------   Indications: Swelling, and Edema.  Comparison Study: no prior Performing Technologist: Archie Patten RVS  Examination Guidelines: A complete evaluation includes B-mode imaging, spectral Doppler, color Doppler, and power Doppler as needed of all accessible portions of each vessel. Bilateral testing is considered an integral part of a complete examination. Limited examinations for reoccurring indications may be performed as noted. The reflux portion of the exam is performed with the patient in reverse Trendelenburg.  +-----+---------------+---------+-----------+----------+--------------+ RIGHTCompressibilityPhasicitySpontaneityPropertiesThrombus Aging +-----+---------------+---------+-----------+----------+--------------+ CFV  Full           Yes      Yes                                 +-----+---------------+---------+-----------+----------+--------------+   +---------+---------------+---------+-----------+----------+--------------+ LEFT     CompressibilityPhasicitySpontaneityPropertiesThrombus Aging +---------+---------------+---------+-----------+----------+--------------+ CFV      Full           Yes      Yes                                 +---------+---------------+---------+-----------+----------+--------------+ SFJ      Full                                                        +---------+---------------+---------+-----------+----------+--------------+ FV Prox  Full                                                        +---------+---------------+---------+-----------+----------+--------------+ FV Mid   Full                                                        +---------+---------------+---------+-----------+----------+--------------+  FV DistalFull                                                        +---------+---------------+---------+-----------+----------+--------------+ PFV      Full                                                         +---------+---------------+---------+-----------+----------+--------------+ POP      Full           Yes      Yes                                 +---------+---------------+---------+-----------+----------+--------------+ PTV      Full                                                        +---------+---------------+---------+-----------+----------+--------------+ PERO     Full                                                        +---------+---------------+---------+-----------+----------+--------------+     Summary: RIGHT: - No evidence of common femoral vein obstruction.  LEFT: - There is no evidence of deep vein thrombosis in the lower extremity.  - No cystic structure found in the popliteal fossa.  *See table(s) above for measurements and observations. Electronically signed by Servando Snare MD on 04/07/2021 at 1:22:15 PM.    Final     Cardiac Studies   Echocardiogram 04/08/2021: Impressions:  1. Hypokinesis of the inferolateral wall with overall mild LV  dysfunction.   2. Left ventricular ejection fraction, by estimation, is 45 to 50%. The  left ventricle has mildly decreased function. The left ventricle  demonstrates regional wall motion abnormalities (see scoring  diagram/findings for description). There is moderate  left ventricular hypertrophy. Left ventricular diastolic parameters are  consistent with Grade I diastolic dysfunction (impaired relaxation).   3. Right ventricular systolic function is normal. The right ventricular  size is normal. There is moderately elevated pulmonary artery systolic  pressure.   4. Left atrial size was mildly dilated.   5. Right atrial size was mildly dilated.   6. The mitral valve is normal in structure. Mild mitral valve  regurgitation. No evidence of mitral stenosis.   7. The aortic valve is normal in structure. Aortic valve regurgitation is  moderate. Mild to moderate aortic valve stenosis.   8. The inferior vena cava is dilated in  size with >50% respiratory  variability, suggesting right atrial pressure of 8 mmHg.    Patient Profile     70 y.o. 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 Cardiology is consulted for acute on chronic heart failure exacerbation.  Assessment & Plan    #Acute on Chronic Hypoxic Respiratory Failure #COPD with Pulmonary Hypertension on Home O2 Patient presented with shortness of breath and cough x1 week after running out of his Trelegy inhaler. O2 sats initially in the 70s on room air. Improved with NRB. Suspect acute decompensation secondary to COPD exacerbation with some CHF component as well.  - Management of COPD per primary - Manage HFrEF as below   #Acute on Chronic Combined CHF #Non-Ischemic Cardiomyopathy: Likely precipitated by hypoxia and Afib with RVR. BNP markedly elevated at 1,899. CXR with some vascular congestion but no overt edema. TTE with LVEF of 45-50% with hypokinesis of the inferolateral wall, grade 1 diastolic dysfunction, and moderately elevated PASP. EF down from 65-70% in 11/2019 and wall motion abnormality is new. - Continue lasix '40mg'$  IV BID - No ACEi/ARB/ARNI or MRA due to renal function. - Continue Coreg 6.'25mg'$  twice daily. - Continue imdur '15mg'$  daily -  Start hydralazine '25mg'$  TID - Continue to monitor daily weights, strict I/Os, and renal function. - Patient would benefit from repeat ischemic evaluation at some point with reduced EF and new wall motion abnormality but will hold off at this time given renal function.   #Paroxysmal Atrial Fibrillation CHADs-vasc 5. Has known history of Afib on apixaban for AC. Found to be in flutter on admission with RVR in the setting of hypoxia as detailed above. Converted to NSR spontaneously in the ER. - Continue Coreg 6.'25mg'$  twice daily. - Continue apixaban '5mg'$  BID - TSH normal   #Non-Sustained VT 24 beat run in the ER in the setting of hypoxia. Lytes normal - Continue Coreg 6.'25mg'$  twice daily - Keep K>4, Mg>2 - Continue to monitor on telemetry   #Elevated Troponin: Likely demand in the setting of acute on chronic hypoxic respiratory failure and Aflutter with RVR. TTE, however, did show interval decrease in LVEF to 45-50% from 65-70% with inferolateral WMA. No current anginal symptoms. Will likely need ischemic work-up once more clinically stable and renal function returns to baseline. Not a good candidate for lexiscan due to acute COPD exacerbation. -Will need ischemic work-up; can be done as out-patient once recovered from acute illness -Likely not a good candidate for lexiscan due to acute COPD exacerbation with active wheezing on exam -Avoiding contrast for now given AKI on CKD   #Hypertension Elevated this AM - Continue Coreg 6.'25mg'$  twice daily. - Continue imdur '15mg'$  daily - Start hydralazine '25mg'$  TID   #Hyperlipidemia LDL much better controlled and at goal at <70. -Continue crestor '10mg'$  daily   #Type 2 Diabetes  - Hemoglobin A1c 7.0 this admission. - Management per primary team.   #PAD Patient with significant history of severe PAD s/p multiple interventions. Recent lower extremity dopplers in 02/2021 showed left ABI of 0.49 and right ABI of 0.80 and ultrasound was concerning for >70%  stenosis of the distal anastomosis in the mid calf area. [name] recommended peripheral angiography to further define  this area but patient wished to discuss this with wife. - Follow-up with Vascular Surgery as outpatient - Continue crestor '10mg'$  daily - Not on ASA '81mg'$  daily given need for apixaban   #Acute on CKD Stage III - Creatinine 2.29 on admission and stable at 2.21 today. Baseline 1.4 to 1.8. - Continue to monitor closely with diuresis      For questions or updates, please contact [place] Please consult [email or web address] for contact info under        Signed, Freada Bergeron, MD  04/09/2021, 6:42 AM

## 2021-04-08 NOTE — Progress Notes (Signed)
Patient complaining of SOB oxygen level 89% on 6 liters via Nasal Cannula. Applied HFNC at 8 liters patients oxygen level improved to 92%. Patient states he feels better with increased oxygen.

## 2021-04-08 NOTE — Progress Notes (Signed)
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 Pt states he uses 4L O2 at home however does not wear his O2 to work "because it is too heavy". States they could not afford his COPD medication and he went without it for several days. Difficult to achieve an accurate SpO2 during activity. Pt seen on 6L and demonstrated minimal SOB with activity however SpO2 ranged from 84-91 with poor pleth. (Multiple sites were tried to get accurate O2 reading.) Will follow acutely to address energy conservation and management of COPD to reduce risk of admission and maximize functional level of independence. Do not anticipate need for OT follow up after DC.    04/08/21 1600  OT Visit Information  Last OT Received On 04/08/21  Assistance Needed +1  PT/OT/SLP Co-Evaluation/Treatment Yes  Reason for Co-Treatment Other (comment) (endurance)  OT goals addressed during session ADL's and self-care  History of Present Illness Pt is a 70 y/o male admitted secondary to increased SOB. Found to have acute respiratory failure, likely from CHF and COPD exacerbation. PMH includes PAD, a fib, DM, and COPD on 4.5 L.  Precautions  Precautions Other (comment)  Precaution Comments Watch O2 sats  Restrictions  Weight Bearing Restrictions No  Home Living  Family/patient expects to be discharged to: Private residence  Living Arrangements Spouse/significant other  Available Help at Discharge Family  Type of [place] to enter  Entrance Stairs-Number of Steps 15  Entrance Stairs-Rails Right;Left;Can reach both  [place] One level  Bathroom Biomedical scientist Yes  Home Equipment None  Additional Comments Reports he uses 4.5 L of oxygen at home but does not use at work because his machine is "too heavy"  Prior Function  Level of Independence Independent  Comments drive; works at [employer] - does not wear O2 while working  Engineer, petroleum No difficulties  Pain Assessment  Pain Assessment No/denies pain  Cognition  Arousal/Alertness Awake/alert  Behavior During Therapy WFL for tasks assessed/performed  Overall Cognitive Status Within Functional Limits for tasks assessed  General Comments Decreased awareness of deficits as he does not wear oxygen at work  Upper Extremity Assessment  Upper Extremity Assessment Overall WFL for tasks assessed  Cervical / Trunk Assessment  Cervical / Trunk Assessment Normal  ADL  Overall ADL's  Needs assistance/impaired  Functional mobility during ADLs Min guard  General ADL Comments able to complete basic ADL tasks with S/set up. Fatigues easily with 2/4 DOE. difficult to get accurate SpO2. States his O2 is usually @ 88-91 on 4L. Appears he would benefit from education regarding managment of COPD adn warning signs of exacerbation  Bed Mobility  Overal bed mobility Modified Independent  Transfers  Overall transfer level Needs assistance  Equipment used None  Transfers Sit to/from Stand  Sit to Stand Min guard;+2 safety/equipment  General transfer comment Min guard for safety.  Balance  Overall balance assessment Mild deficits observed, not formally tested  General Comments  General comments (skin integrity, edema, etc.) Wife present adn said they couldn't afford to pay for his COPD medication adn he missed several days  OT - End of Session  Equipment Utilized During Treatment Gait belt  Activity Tolerance Patient tolerated treatment well  Patient left in bed;with call bell/phone within reach;with family/visitor present  Nurse Communication Mobility status  OT Assessment  OT Recommendation/Assessment Patient needs continued OT Services  OT Visit Diagnosis Unsteadiness on feet (R26.81)  OT Problem List Decreased activity tolerance;Decreased safety awareness;Decreased knowledge of use of DME or  AE;Cardiopulmonary status limiting activity  OT Plan  OT Frequency (ACUTE ONLY) Min 2X/week  OT Treatment/Interventions (ACUTE ONLY) Self-care/ADL training;Therapeutic exercise;Energy conservation;DME and/or AE instruction;Therapeutic activities;Patient/family education  AM-PAC OT "6 Clicks" Daily Activity Outcome Measure (Version 2)  Help from another person eating meals? 4  Help from another person taking care of personal grooming? 3  Help from another person toileting, which includes using toliet, bedpan, or urinal? 3  Help from another person bathing (including washing, rinsing, drying)? 3  Help from another person to put on and taking off regular upper body clothing? 3  Help from another person to put on and taking off regular lower body clothing? 3  6 Click Score 19  OT Recommendation  Follow Up Recommendations No OT follow up;Supervision - Intermittent  OT Equipment 3 in 1 bedside commode (to use as shower chair)  Individuals Consulted  Consulted and Agree with Results and Recommendations Patient;Family member/caregiver  Family Member Consulted wife  Acute Rehab OT Goals  Patient Stated Goal to go home  OT Goal Formulation With patient  Time For Goal Achievement 04/22/21  Potential to Achieve Goals Good  OT Time Calculation  OT Start Time (ACUTE ONLY) 1003  OT Stop Time (ACUTE ONLY) 1035  OT Time Calculation (min) 32 min  Written Expression  Dominant Hand Right  Maurie Boettcher, OT/L   Acute OT Clinical Specialist Acute Rehabilitation Services Pager [phone] Office [phone]

## 2021-04-09 ENCOUNTER — Inpatient Hospital Stay: Admission: RE | Admit: 2021-04-09 | Payer: Medicare HMO | Source: Ambulatory Visit

## 2021-04-09 ENCOUNTER — Inpatient Hospital Stay (HOSPITAL_COMMUNITY): Payer: Medicare HMO

## 2021-04-09 DIAGNOSIS — J9621 Acute and chronic respiratory failure with hypoxia: Secondary | ICD-10-CM | POA: Diagnosis not present

## 2021-04-09 DIAGNOSIS — R0902 Hypoxemia: Secondary | ICD-10-CM | POA: Diagnosis not present

## 2021-04-09 DIAGNOSIS — E119 Type 2 diabetes mellitus without complications: Secondary | ICD-10-CM | POA: Diagnosis not present

## 2021-04-09 DIAGNOSIS — I482 Chronic atrial fibrillation, unspecified: Secondary | ICD-10-CM | POA: Diagnosis not present

## 2021-04-09 LAB — BASIC METABOLIC PANEL
Anion gap: 16 — ABNORMAL HIGH (ref 5–15)
BUN: 45 mg/dL — ABNORMAL HIGH (ref 8–23)
CO2: 16 mmol/L — ABNORMAL LOW (ref 22–32)
Calcium: 8.4 mg/dL — ABNORMAL LOW (ref 8.9–10.3)
Chloride: 105 mmol/L (ref 98–111)
Creatinine, Ser: 1.93 mg/dL — ABNORMAL HIGH (ref 0.61–1.24)
GFR, Estimated: 37 mL/min — ABNORMAL LOW (ref >60)
Glucose, Bld: 151 mg/dL — ABNORMAL HIGH (ref 70–99)
Potassium: 5.4 mmol/L — ABNORMAL HIGH (ref 3.5–5.1)
Sodium: 137 mmol/L (ref 135–145)

## 2021-04-09 LAB — LIPID PANEL
Cholesterol: 82 mg/dL (ref 0–200)
HDL: 31 mg/dL — ABNORMAL LOW (ref >40)
LDL Cholesterol: 37 mg/dL (ref 0–99)
Total CHOL/HDL Ratio: 2.6 RATIO
Triglycerides: 70 mg/dL (ref <150)
VLDL: 14 mg/dL (ref 0–40)

## 2021-04-09 LAB — GLUCOSE, CAPILLARY
Glucose-Capillary: 118 mg/dL — ABNORMAL HIGH (ref 70–99)
Glucose-Capillary: 190 mg/dL — ABNORMAL HIGH (ref 70–99)
Glucose-Capillary: 146 mg/dL — ABNORMAL HIGH (ref 70–99)
Glucose-Capillary: 172 mg/dL — ABNORMAL HIGH (ref 70–99)

## 2021-04-09 LAB — POTASSIUM: Potassium: 4.2 mmol/L (ref 3.5–5.1)

## 2021-04-09 LAB — BLOOD GAS, VENOUS
Acid-base deficit: 1.7 mmol/L (ref 0.0–2.0)
Bicarbonate: 22.4 mmol/L (ref 20.0–28.0)
FIO2: 56
O2 Saturation: 87.2 %
Patient temperature: 36.9
pCO2, Ven: 36.4 mmHg — ABNORMAL LOW (ref 44.0–60.0)
pH, Ven: 7.405 (ref 7.250–7.430)
pO2, Ven: 54.2 mmHg — ABNORMAL HIGH (ref 32.0–45.0)

## 2021-04-09 MED ORDER — FUROSEMIDE 10 MG/ML IJ SOLN
40.0000 mg | Freq: Two times a day (BID) | INTRAMUSCULAR | Status: AC
  Administered 2021-04-09 – 2021-04-11 (×6): 40 mg via INTRAVENOUS
  Filled 2021-04-09 (×6): qty 4

## 2021-04-09 MED ORDER — HYDRALAZINE HCL 25 MG PO TABS
25.0000 mg | ORAL_TABLET | Freq: Three times a day (TID) | ORAL | Status: DC
  Administered 2021-04-09 – 2021-04-12 (×11): 25 mg via ORAL
  Filled 2021-04-09 (×11): qty 1

## 2021-04-09 MED ORDER — ACETYLCYSTEINE 20 % IN SOLN
4.0000 mL | Freq: Two times a day (BID) | RESPIRATORY_TRACT | Status: DC
  Administered 2021-04-09: 4 mL via RESPIRATORY_TRACT
  Filled 2021-04-09 (×3): qty 4

## 2021-04-09 NOTE — Progress Notes (Addendum)
FPTS Interim Progress Note  Patient sleeping and resting comfortably.  Rounded with primary RN. Remains on 7 L HFNC.  No concerns voiced.  No orders required.  Appreciated nightly round.  Today's Vitals   04/08/21 2041 04/09/21 0216 04/09/21 0505 04/09/21 0525  BP:   (!) 145/75   Pulse:   83   Resp: 20  20   Temp: 98.4 F (36.9 C)  98.7 F (37.1 C)   TempSrc: Axillary  Oral   SpO2:  96% 96%   Weight:    73.7 kg  Height:      PainSc:       Hyperkalemia- K 5.4 Repeat serum potassium ECG now Follow up and if remains elevated treat  Carollee Leitz, MD 04/09/2021, 6:58 AM PGY-2, [facility] Medicine Service pager [phone]

## 2021-04-09 NOTE — Progress Notes (Signed)
Family Medicine Teaching Service Daily Progress Note Intern Pager: [phone]  Patient name: Kyle [name] Medical record number: [id number] Date of birth: [birth year] Age: 70 y.o. Gender: male  Primary Care Provider: Alonna Buckler, MD Consultants: Cards Code Status: Full  Pt Overview and Major Events to Date:  6/13- Admitted  Assessment and Plan: TAIJON KLOSTER is a 70 y.o. male presenting with dyspnea. PMH is significant for atrial fibrillation anticoagulant Eliquis, HTN, HLD, CKD, CHF, COPD on 4-1/2 L nasal cannula.    Acute Hypoxic Respiratory Failure  New worsening HFmrEF Echo showed LVEF of 45-50% with hypokinesis of the inferolateral wall, grade 1 diastolic dysfunction, and moderately elevated PASP. EF down from 65-70% in 11/2019 and wall motion abnormality is new.  Received IV Lasix 40 yesterday. Norvasc stopped.  Cards consulted and following.  Started on ImDur.  Started on Hydralazine 25 mg TID.   - Cards following, appreciate recs - Continue to diurese and manage blood pressure per Cardiology recs  COPD Had to have increased O2 to 8L HFNC overnight.  Was feeling SOB.  After deep breathing on exam and cough dropped to 84 while iroom.  Increased O2 to 8.5 L. - Day 3 of 3 of Azithromycin & Steroid  - Prescribe Mucomyst nebulizer to help clear - Continue to monitor respiratory status   AKI on CKD stage 3a Cr- 2.3>2.21>1.93.  Kidney function continuing to improve with diuresis.      - AM BMP  Hyperkalemia Repeat this AM was 4.2.   - AM BMP  Mild Anion Gap Acidosis  - Order venous blood gas  Social Patient having issues affording all of medications due to medicare doughnut hole. - Reach out to wife to see if visiting today to meet with Pharmacy - Pharmacy to help patient obtain medications out of doughnut hole  FEN/GI: Regular diet PPx: Lovenox   Status is: Inpatient  Remains inpatient appropriate because:Inpatient level of care appropriate due to severity of  illness  Dispo: The patient is from: Home              Anticipated d/c is to: Home              Patient currently is not medically stable to d/c.   Difficult to place patient No    Subjective:  Patient had difficulty breathing overnight.  Indicates feels need to cough.    Objective: Temp:  [97.8 F (36.6 C)-98.7 F (37.1 C)] 98.7 F (37.1 C) (06/15 0505) Pulse Rate:  [53-89] 83 (06/15 0505) Resp:  [15-26] 20 (06/15 0505) BP: (128-156)/(58-91) 145/75 (06/15 0505) SpO2:  [90 %-97 %] 96 % (06/15 0505) Weight:  [73.7 kg] 73.7 kg (06/15 0525) Physical Exam:  Physical Exam HENT:     Head: Normocephalic and atraumatic.  Cardiovascular:     Rate and Rhythm: Normal rate and regular rhythm.     Heart sounds: No murmur heard. Pulmonary:     Breath sounds: Examination of the right-middle field reveals wheezing. Examination of the right-lower field reveals wheezing. Examination of the left-lower field reveals wheezing. Wheezing present. No decreased breath sounds or rales.  Skin:    General: Skin is warm.  Neurological:     General: No focal deficit present.     Mental Status: He is alert.     Laboratory: Recent Labs  Lab 04/07/21 1100 04/07/21 1108 04/07/21 2149 04/08/21 0500  WBC 13.7*  --   --  15.6*  HGB 14.9 16.3 15.3 14.4  HCT 47.3 48.0 45.0 45.5  PLT 207  --   --  205   Recent Labs  Lab 04/07/21 1100 04/07/21 1108 04/07/21 2149 04/08/21 0650 04/09/21 0317  NA 138 140 137 137 137  K 4.3 4.2 4.2 4.5 5.4*  CL 105 105  --  110 105  CO2 22  --   --  20* 16*  BUN 27* 28*  --  37* 45*  CREATININE 2.29* 2.30*  --  2.21* 1.93*  CALCIUM 8.6*  --   --  8.1* 8.4*  PROT 6.1*  --   --   --   --   BILITOT 1.0  --   --   --   --   ALKPHOS 87  --   --   --   --   ALT 41  --   --   --   --   AST 32  --   --   --   --   GLUCOSE 225* 221*  --  195* 151*     Imaging/Diagnostic Tests: No new imaging  Delora Fuel, MD 04/09/2021, 7:55 AM PGY-1, [place] Intern pager: [phone], text pages welcome

## 2021-04-09 NOTE — TOC Initial Note (Signed)
Transition of Care [facility]) - Initial/Assessment Note    Patient Details  Name: Kyle [name] MRN: [id number] Date of Birth: [birth year]  Transition of Care [facility]) CM/SW Contact:    Angelita Ingles, RN Phone Number:[phone]  04/09/2021, 10:21 AM  Clinical Narrative:                 [facility] consulted for patient with needs for medication assistance. Per MD patient states that he is in the donut hole and is unable to afford his medications. CM at bedside to discuss options with patient. Nebulizer machine has been ordered through [employer] and will be delivered to the room. CM has informed patient that TOC is unable to provide financial assistance for medications for insured patient. Patient verbalized understanding. No other needs noted at this time. TOC will sign off.   Expected Discharge Plan: Home/Self Care Barriers to Discharge: Continued Medical Work up   Patient Goals and CMS Choice Patient states their goals for this hospitalization and ongoing recovery are:: Wants to get better   Choice offered to / list presented to : NA  Expected Discharge Plan and Services Expected Discharge Plan: Home/Self Care In-house Referral: NA Discharge Planning Services: CM Consult, Other - See comment (unable to offer med assistance to insured patient) Post Acute Care Choice: NA Living arrangements for the past 2 months: Apartment                 DME Arranged: Nebulizer/meds DME Agency: [employer] Date DME Agency Contacted: 04/09/21 Time DME Agency Contacted: [phone] Representative spoke with at DME Agency: [place]: NA [place] Agency: NA Date [place]: 04/09/21 Time [place]: 60 Representative spoke with at [place]: [place] Arrangements/Services Living arrangements for the past 2 months: Apartment Lives with:: Self Patient language and need for interpreter reviewed:: Yes Do you feel safe going back to the place where you live?: Yes      Need for Family  Participation in Patient Care: Yes (Comment) Care giver support system in place?: Yes (comment) Current home services:  (n/a) Criminal Activity/Legal Involvement Pertinent to Current Situation/Hospitalization: No - Comment as needed  Activities of Daily Living Home Assistive Devices/Equipment: Oxygen, Dentures (specify type) (upper lower) ADL Screening (condition at time of admission) Patient's cognitive ability adequate to safely complete daily activities?: Yes Is the patient deaf or have difficulty hearing?: No Does the patient have difficulty seeing, even when wearing glasses/contacts?: No Does the patient have difficulty concentrating, remembering, or making decisions?: Yes Patient able to express need for assistance with ADLs?: Yes Does the patient have difficulty dressing or bathing?: No Independently performs ADLs?: Yes (appropriate for developmental age) Does the patient have difficulty walking or climbing stairs?: No Weakness of Legs: None Weakness of Arms/Hands: None  Permission Sought/Granted   Permission granted to share information with : No              Emotional Assessment Appearance:: Appears stated age Attitude/Demeanor/Rapport: Gracious Affect (typically observed): Pleasant Orientation: : Oriented to Self, Oriented to Place, Oriented to  Time, Oriented to Situation Alcohol / Substance Use: Not Applicable Psych Involvement: No (comment)  Admission diagnosis:  Hypoxia [R09.02] Atrial flutter, unspecified type [facility]) [I48.92] Patient Active Problem List   Diagnosis Date Noted   Hypoxia 04/08/2021   Atrial flutter ([place])    Acute exacerbation of chronic obstructive pulmonary disease (COPD) ([place]) 04/07/2021   Noncompliance 04/16/2020   Hyperosmolar syndrome 02/20/2020   CKD stage 3 secondary to  diabetes ([place]) 02/20/2020   Hyperosmolar hyperglycemic state (HHS) ([place]) 02/19/2020   CKD (chronic kidney disease) stage 4, GFR 15-29 ml/min ([place]) 02/19/2020    Abnormal findings on diagnostic imaging of lung 01/29/2020   Anticoagulant long-term use 01/29/2020   Heart failure ([place]) 01/20/2020   SOB (shortness of breath)    Uremia 12/24/2019   Atrial fibrillation, chronic ([place]) 12/24/2019   Diabetes mellitus type 2, insulin dependent ([place]) 12/24/2019   Hyperkalemia, diminished renal excretion 12/24/2019   Healthcare maintenance 07/14/2019   Medication management 07/14/2019   Surgical wound dehiscence, initial encounter 05/11/2019   PAD (peripheral artery disease) ([place]) 04/22/2019   Atherosclerosis of artery of extremity with rest pain ([place]) 04/21/2019   Deformity of metatarsal bone of right foot 04/17/2019   COPD with chronic bronchitis and emphysema ([place]) 12/16/2018   Acute on chronic respiratory failure with hypoxemia ([place]) 12/16/2018   Acute renal failure (HCC)    Elevated PSA 12/27/2017   Abnormal EKG 12/21/2017   Protein-calorie malnutrition, severe ([place]) 12/18/2014   Aortoiliac occlusive disease ([place]) 12/16/2014   PVD (peripheral vascular disease) ([place]) 10/24/2014   Aortic stenosis 09/12/2014   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 [place] [place] [address] Phone: [phone] Fax: [facility] [place], [place] [address] Phone: [phone] Fax: [phone]  [facility] [address] [address] Phone: [phone] Fax:  [phone]     Social Determinants of Health (SDOH) Interventions    Readmission Risk Interventions Readmission Risk Prevention Plan 02/22/2020 01/24/2020 04/26/2019  Transportation Screening Complete Complete Complete  PCP or Specialist Appt within 5-7 Days - - Complete  Home Care Screening - - Complete  Medication Review (RN CM) - - Complete  Medication Review Press photographer) Complete Complete -  PCP or Specialist appointment within 3-5 days of discharge Complete Complete -  [place] or Home Care Consult Complete Complete -  SW Recovery Care/Counseling Consult Complete Complete -  Palliative Care Screening Not Applicable Not Applicable -  [place] Not Applicable Not Applicable -  Some recent data might be hidden

## 2021-04-09 NOTE — Progress Notes (Addendum)
Heart Failure Stewardship Pharmacist Progress Note   PCP: Alonna Buckler, MD PCP-Cardiologist: Peter Martinique, MD    HPI:  70 yo M with PMH of HTN, NICM, HFimpEF, PAD, afib, pulmonary HTN, T2DM, CKD III, and HLD. He presented to the ED on 6/13 with shortness of breath. He was found to be in afib RVR on admission. CXR with cardiomegaly, vascular congestion and bibasilar atelectasis. An ECHO was done on 6/14 and LVEF was 45-50% (65-70% in 11/2019; 20% in 07/2014).  Current HF Medications: Furosemide 40 mg IV BID Carvedilol 6.25 mg BID Hydralazine 25 mg TID Imdur 15 mg daily  Prior to admission HF Medications: Furosemide 20 mg daily Carvedilol 6.25 mg BID  Pertinent Lab Values: Serum creatinine 1.93, BUN 45, Potassium 4.2, Sodium 137, BNP 1899.5, Magnesium 2.1  Vital Signs: Weight: 162 lbs (admission weight: 160 lbs) Blood pressure: 140-150/80s  Heart rate: 70-80s  Medication Assistance / Insurance Benefits Check: Does the patient have prescription insurance?  Yes Type of insurance plan: [employer]  Outpatient Pharmacy:  Prior to admission outpatient pharmacy: [facility] Is the patient willing to use [facility] at discharge? Yes Is the patient willing to transition their outpatient pharmacy to utilize a [facility] outpatient pharmacy?   Pending     Assessment: 1. Acute on chronic systolic CHF (EF [id number]), due to NICM. NYHA class III symptoms. SCr improving 2.2>1.9. - Continue furosemide 40 mg IV BID - Continue carvedilol 6.25 mg BID - No ACE/ARB/ARNI or spironolactone with AKI on CKD III - Consider starting SGLT2i prior to discharge pending SCr trends - Agree with starting hydralazine 25 mg TID. May need to increase to 50 mg TID if BP remains elevated. - Continue Imdur 15 mg daily   Plan: 1) Medication changes recommended at this time: - Agree with changes above  2) Patient assistance: - None pending at this time - HF TOC appt made for 6/22  3)  Education   - To be completed prior to discharge  Kerby Nora, PharmD, BCPS Heart Failure Stewardship Pharmacist Phone [phone]

## 2021-04-09 NOTE — Progress Notes (Signed)
Patient did not tolerate mucomyst and the treatment was discontinued. Patient stated that his cough was worse and he was having a hard time breathing. Patient started on BIPAP to maintain his oxygen.

## 2021-04-10 DIAGNOSIS — I5023 Acute on chronic systolic (congestive) heart failure: Secondary | ICD-10-CM

## 2021-04-10 DIAGNOSIS — J9621 Acute and chronic respiratory failure with hypoxia: Secondary | ICD-10-CM | POA: Diagnosis not present

## 2021-04-10 DIAGNOSIS — J441 Chronic obstructive pulmonary disease with (acute) exacerbation: Secondary | ICD-10-CM | POA: Diagnosis not present

## 2021-04-10 DIAGNOSIS — I482 Chronic atrial fibrillation, unspecified: Secondary | ICD-10-CM | POA: Diagnosis not present

## 2021-04-10 LAB — BASIC METABOLIC PANEL
Anion gap: 9 (ref 5–15)
BUN: 40 mg/dL — ABNORMAL HIGH (ref 8–23)
CO2: 27 mmol/L (ref 22–32)
Calcium: 8.2 mg/dL — ABNORMAL LOW (ref 8.9–10.3)
Chloride: 101 mmol/L (ref 98–111)
Creatinine, Ser: 1.9 mg/dL — ABNORMAL HIGH (ref 0.61–1.24)
GFR, Estimated: 38 mL/min — ABNORMAL LOW (ref >60)
Glucose, Bld: 105 mg/dL — ABNORMAL HIGH (ref 70–99)
Potassium: 4.3 mmol/L (ref 3.5–5.1)
Sodium: 137 mmol/L (ref 135–145)

## 2021-04-10 LAB — GLUCOSE, CAPILLARY
Glucose-Capillary: 133 mg/dL — ABNORMAL HIGH (ref 70–99)
Glucose-Capillary: 190 mg/dL — ABNORMAL HIGH (ref 70–99)
Glucose-Capillary: 202 mg/dL — ABNORMAL HIGH (ref 70–99)
Glucose-Capillary: 133 mg/dL — ABNORMAL HIGH (ref 70–99)

## 2021-04-10 MED ORDER — PREDNISONE 20 MG PO TABS
40.0000 mg | ORAL_TABLET | Freq: Every day | ORAL | Status: AC
  Administered 2021-04-10 – 2021-04-11 (×2): 40 mg via ORAL
  Filled 2021-04-10 (×2): qty 2

## 2021-04-10 MED ORDER — ISOSORBIDE MONONITRATE ER 30 MG PO TB24
30.0000 mg | ORAL_TABLET | Freq: Every day | ORAL | Status: DC
  Administered 2021-04-11 – 2021-04-12 (×2): 30 mg via ORAL
  Filled 2021-04-10 (×2): qty 1

## 2021-04-10 MED ORDER — INSULIN PUMP
Freq: Three times a day (TID) | SUBCUTANEOUS | Status: DC
  Filled 2021-04-10: qty 1

## 2021-04-10 NOTE — Progress Notes (Signed)
Occupational Therapy Treatment Patient Details Name: Kyle [name] MRN: [id number] DOB: 1951-10-14 Today's Date: 04/10/2021    History of present illness Pt is a 70 y/o male admitted secondary to increased SOB. Found to have acute respiratory failure, likely from CHF and COPD exacerbation. PMH includes PAD, a fib, DM, and COPD on 4.5 L.   OT comments  Pt received supine in bed reporting no pain. Pt declined OOB mobiilty or ADL participation as pt reports he has been getting up to go to the bathroom. Session focus on education related to energy conservation strategies for home and signs and symptoms of COPD exacerbation. Pt verbalized understanding of all education, provided handouts to reinforce education. Pt on 10 L HFNC during session with SpO2 88-87% with pt denying SOB. Pt would continue to benefit from skilled occupational therapy while admitted and after d/c to address the below listed limitations in order to improve overall functional mobility and facilitate independence with BADL participation. DC plan remains appropriate, will follow acutely per POC.     Follow Up Recommendations  No OT follow up;Supervision - Intermittent    Equipment Recommendations  3 in 1 bedside commode;Other (comment) (to use as showe chair)    Recommendations for Other Services      Precautions / Restrictions Precautions Precautions: Other (comment) Precaution Comments: Watch O2 sats Restrictions Weight Bearing Restrictions: No       Mobility Bed Mobility               General bed mobility comments: pt declined OOB    Transfers                 General transfer comment: pt declined OOB transfer,states hes been moving around to the BR    Balance                                           ADL either performed or assessed with clinical judgement   ADL                                         General ADL Comments: pt declined OOB mobility or ADL  participation, session focus on education related to energy conservation strategies as well as COPD education. education provided on compensatory methods for BADL participation and signs and symptoms of COPD exacerbation with pt verbalizing understanding     Vision       Perception     Praxis      Cognition Arousal/Alertness: Awake/alert Behavior During Therapy: WFL for tasks assessed/performed Overall Cognitive Status: Within Functional Limits for tasks assessed                                          Exercises Other Exercises Other Exercises: education provided on PLB and diaphragmatic breahting- provided handout to reinforce education   Shoulder Instructions       General Comments pt able to verbalize technique for PLB, issued pt handouts on energy conservation and COPD exacerbation. pt on 10 HFNC with SpO2 88% most of session pt denies SOB, pt able to state appropriate SpO2 reading for home with pt reporting that he checks his O2 in the mornings  Pertinent Vitals/ Pain       Pain Assessment: No/denies pain  Home Living                                          Prior Functioning/Environment              Frequency  Min 2X/week        Progress Toward Goals  OT Goals(current goals can now be found in the care plan section)  Progress towards OT goals: Progressing toward goals  Acute Rehab OT Goals Patient Stated Goal: none stated Time For Goal Achievement: 04/22/21 Potential to Achieve Goals: Good  Plan Discharge plan remains appropriate;Frequency remains appropriate    Co-evaluation                 AM-PAC OT "6 Clicks" Daily Activity     Outcome Measure   Help from another person eating meals?: None Help from another person taking care of personal grooming?: A Little Help from another person toileting, which includes using toliet, bedpan, or urinal?: A Little Help from another person bathing (including  washing, rinsing, drying)?: A Little Help from another person to put on and taking off regular upper body clothing?: A Little Help from another person to put on and taking off regular lower body clothing?: A Little 6 Click Score: 19    End of Session    OT Visit Diagnosis: Unsteadiness on feet (R26.81)   Activity Tolerance Patient tolerated treatment well   Patient Left in bed;with call bell/phone within reach;with bed alarm set   Nurse Communication Mobility status        Time: [id number] OT Time Calculation (min): 8 min  Charges: OT General Charges $OT Visit: 1 Visit OT Treatments $Self Care/Home Management : 8-22 mins  [name]., COTA/L Acute Rehabilitation Services [phone]    [name] 04/10/2021, 4:21 PM

## 2021-04-10 NOTE — Progress Notes (Addendum)
Progress Note  Patient Name: Kyle [name] Date of Encounter: 04/11/2021  [facility] Cardiologist: Peter Martinique, MD   Subjective  Patient states breathing is continuing to improve. Secretions have loosened and he is now able to cough up sputum which has helped his symptoms.   Respiratory status stable; remains on HFNC HR 70-80s Blood pressures 120-140s Cr continuing to improve 1.9-->1.7 Net negative 1.7L Wt 158-->156lbs  Inpatient Medications    Scheduled Meds:  apixaban  5 mg Oral BID   budesonide (PULMICORT) nebulizer solution  0.25 mg Nebulization BID   carvedilol  6.25 mg Oral BID WC   furosemide  40 mg Intravenous BID   guaiFENesin  600 mg Oral BID   hydrALAZINE  25 mg Oral Q8H   insulin aspart  0-20 Units Subcutaneous TID WC   insulin detemir  28 Units Subcutaneous Daily   ipratropium-albuterol  3 mL Nebulization Q6H   isosorbide mononitrate  30 mg Oral Daily   predniSONE  40 mg Oral Q breakfast   rosuvastatin  10 mg Oral q1800   Continuous Infusions:  PRN Meds: acetaminophen **OR** acetaminophen, albuterol   Vital Signs    Vitals:   04/11/21 0351 04/11/21 0602 04/11/21 0605 04/11/21 0800  BP: 124/78  122/84   Pulse: 80     Resp: 18     Temp: 97.8 F (36.6 C)     TempSrc: Oral     SpO2: 92%     Weight:  73.3 kg  71 kg  Height:        Intake/Output Summary (Last 24 hours) at 04/11/2021 0804 Last data filed at 04/11/2021 [id number] Gross per 24 hour  Intake 690 ml  Output 2100 ml  Net -1410 ml   Last 3 Weights 04/11/2021 04/11/2021 04/10/2021  Weight (lbs) 156 lb 9.6 oz 161 lb 9.6 oz 158 lb 12.8 oz  Weight (kg) 71.033 kg 73.3 kg 72.031 kg      Telemetry    NSR, frequent PVCs, PACs, brief runs NSVT- Personally Reviewed  ECG    No new tracing- Personally Reviewed  Physical Exam   GEN: Comfortable, on HFNC, speaking in full sentences Neck: No JVP Cardiac: RRR, no murmurs, rubs, or gallops.  Respiratory: Diminished throughout. No  wheezes GI: Soft, nontender, non-distended  MS: No significant LE edema; warm Neuro:  Nonfocal  Psych: Normal affect   Labs    High Sensitivity Troponin:   Recent Labs  Lab 04/07/21 1100 04/07/21 1229  TROPONINIHS 99* 88*      Chemistry Recent Labs  Lab 04/07/21 1100 04/07/21 1108 04/09/21 0317 04/09/21 0725 04/10/21 0538 04/11/21 0213  NA 138   < > 137  --  137 137  K 4.3   < > 5.4* 4.2 4.3 4.3  CL 105   < > 105  --  101 101  CO2 22   < > 16*  --  27 25  GLUCOSE 225*   < > 151*  --  105* 110*  BUN 27*   < > 45*  --  40* 42*  CREATININE 2.29*   < > 1.93*  --  1.90* 1.68*  CALCIUM 8.6*   < > 8.4*  --  8.2* 8.9  PROT 6.1*  --   --   --   --   --   ALBUMIN 2.7*  --   --   --   --   --   AST 32  --   --   --   --   --  ALT 41  --   --   --   --   --   ALKPHOS 87  --   --   --   --   --   BILITOT 1.0  --   --   --   --   --   GFRNONAA 30*   < > 37*  --  38* 44*  ANIONGAP 11   < > 16*  --  9 11   < > = values in this interval not displayed.     Hematology Recent Labs  Lab 04/07/21 1100 04/07/21 1108 04/07/21 2149 04/08/21 0500  WBC 13.7*  --   --  15.6*  RBC 4.87  --   --  4.73  HGB 14.9 16.3 15.3 14.4  HCT 47.3 48.0 45.0 45.5  MCV 97.1  --   --  96.2  MCH 30.6  --   --  30.4  MCHC 31.5  --   --  31.6  RDW 17.8*  --   --  17.1*  PLT 207  --   --  205    BNP Recent Labs  Lab 04/07/21 1100  BNP 1,899.5*     DDimer No results for input(s): DDIMER in the last 168 hours.   Radiology    DG CHEST PORT 1 VIEW  Result Date: 04/10/2021 CLINICAL DATA:  Hypoxia and shortness of breath EXAM: PORTABLE CHEST 1 VIEW COMPARISON:  04/07/2021 FINDINGS: Shallow inspiration. Cardiac enlargement with mild perihilar infiltration. This could indicate edema or pneumonia. No pleural effusions. No pneumothorax. Similar appearance to previous study. IMPRESSION: Cardiac enlargement with bilateral pulmonary perihilar infiltrates. Electronically Signed   By: Lucienne Capers  M.D.   On: 04/10/2021 00:22    Cardiac Studies   Echocardiogram 04/08/2021: Impressions:  1. Hypokinesis of the inferolateral wall with overall mild LV  dysfunction.   2. Left ventricular ejection fraction, by estimation, is 45 to 50%. The  left ventricle has mildly decreased function. The left ventricle  demonstrates regional wall motion abnormalities (see scoring  diagram/findings for description). There is moderate  left ventricular hypertrophy. Left ventricular diastolic parameters are  consistent with Grade I diastolic dysfunction (impaired relaxation).   3. Right ventricular systolic function is normal. The right ventricular  size is normal. There is moderately elevated pulmonary artery systolic  pressure.   4. Left atrial size was mildly dilated.   5. Right atrial size was mildly dilated.   6. The mitral valve is normal in structure. Mild mitral valve  regurgitation. No evidence of mitral stenosis.   7. The aortic valve is normal in structure. Aortic valve regurgitation is  moderate. Mild to moderate aortic valve stenosis.   8. The inferior vena cava is dilated in size with >50% respiratory  variability, suggesting right atrial pressure of 8 mmHg.    Patient Profile     70 y.o. 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 Cardiology is consulted for acute on chronic heart failure exacerbation.  Assessment & Plan    #Acute on Chronic Hypoxic Respiratory Failure #COPD with Pulmonary Hypertension on Home O2 Patient presented with shortness of breath and cough x1 week after running out of his Trelegy inhaler. O2 sats initially in the 70s on room air. Improved with NRB. Suspect acute decompensation secondary to COPD exacerbation in combination with acute on chronic systolic HF exacerbation - Management of COPD per primary - Manage HFrEF as below   #Acute on Chronic Combined CHF #Non-Ischemic Cardiomyopathy: Likely precipitated by hypoxia and Afib with RVR. BNP markedly elevated at 1,899. CXR with some vascular congestion but no overt edema. TTE with LVEF of 45-50% with hypokinesis of the inferolateral wall, grade 1 diastolic dysfunction, and moderately elevated PASP. EF down from 65-70% in 11/2019 and wall motion abnormality is new. - Continue lasix '40mg'$  IV BID - No ACEi/ARB/ARNI or MRA due to renal function. - Continue Coreg 6.'25mg'$  twice daily. - Continue imdur '30mg'$  daily - Continue hydralazine '25mg'$  TID; up-titrate as tolerated - Potentially add SGLT2i tomorrow pending GFR - Continue to monitor daily weights, strict I/Os, and renal function. - Patient would benefit from repeat ischemic evaluation at some point with reduced EF and new wall motion abnormality; consider as out-patient once recovered from acute illness   #Paroxysmal Atrial Fibrillation CHADs-vasc 5. Has known history of Afib on apixaban for AC. Found to be in flutter on admission with RVR in the setting of hypoxia as detailed above. Converted to NSR spontaneously in the ER. - Continue Coreg 6.'25mg'$  twice daily. - Continue apixaban '5mg'$  BID - TSH normal   #Non-Sustained VT #Frequent PVCs, NSVT 24 beat run in the ER in the setting of hypoxia. Lytes normal - Continue Coreg 6.'25mg'$  twice daily - Keep  K>4, Mg>2 - Continue to monitor on telemetry   #Elevated Troponin: Likely demand in the setting of acute on chronic hypoxic respiratory failure and Aflutter with RVR. TTE, however, did show interval decrease in LVEF to 45-50% from 65-70% with inferolateral WMA. No current anginal symptoms. Will likely need ischemic work-up once more clinically stable and renal function returns to baseline. Not a good candidate for lexiscan due to acute COPD exacerbation. -Will need ischemic work-up; can be done as out-patient once recovered from acute illness -Not a good candidate for lexiscan due to acute COPD exacerbation  -Avoiding contrast for now given AKI on CKD   #Hypertension Improved. 120s this AM. - Continue Coreg 6.'25mg'$  twice daily. - Continue imdur '30mg'$  daily - Continue hydralazine '25mg'$  TID; will up-titrate as tolerated   #Hyperlipidemia LDL much better controlled and at goal at <70. -Continue crestor '10mg'$  daily   #Type 2 Diabetes  - Hemoglobin A1c 7.0 this admission. - Management per primary team.   #PAD Patient with significant history of severe PAD s/p multiple interventions. Recent lower extremity dopplers in 02/2021 showed left ABI of 0.49 and right ABI of 0.80 and ultrasound was concerning for >70% stenosis of the distal anastomosis in the mid calf area. [name] recommended peripheral angiography to further define this area but patient wished to discuss this with wife. - Follow-up with Vascular Surgery as outpatient - Continue crestor '10mg'$  daily - Not on ASA '81mg'$  daily given need for apixaban   #Acute on CKD Stage III Significantly improved.  - Creatinine 2.29 on admission and improved to 1.7 today. Baseline 1.4 to 1.8. - Continue to  monitor closely with diuresis  Will switch to PO lasix tomorrow and reassess volume status. Likely discharge home tomorrow on lasix '40mg'$  PO daily with repeat BMET in 1 week.      For questions or updates, please contact [place] Please  consult [email or web address] for contact info under        Signed, Freada Bergeron, MD  04/11/2021, 8:04 AM

## 2021-04-10 NOTE — Progress Notes (Signed)
RT removed pt from BiPAP and placed pt on 10L [place]. Pt tolerated well with SVS. RT will continue to monitor pt as needed.

## 2021-04-10 NOTE — Progress Notes (Signed)
Progress Note  Patient Name: Kyle [name] Date of Encounter: 04/10/2021  [facility] Cardiologist: Peter Martinique, MD   Subjective   Placed on BiPAP overnight-->transitioned to 10L HFNC this AM Cr 1.93-->1.9 2300 UOP; Wt not recorded  States he feels much better this morning. Breathing improved. Continues to have good UOP.  Inpatient Medications    Scheduled Meds:  apixaban  5 mg Oral BID   budesonide (PULMICORT) nebulizer solution  0.25 mg Nebulization BID   carvedilol  6.25 mg Oral BID WC   furosemide  40 mg Intravenous BID   guaiFENesin  600 mg Oral BID   hydrALAZINE  25 mg Oral Q8H   insulin aspart  0-20 Units Subcutaneous TID WC   insulin detemir  28 Units Subcutaneous Daily   ipratropium-albuterol  3 mL Nebulization Q6H   isosorbide mononitrate  15 mg Oral Daily   rosuvastatin  10 mg Oral q1800   Continuous Infusions:  PRN Meds: acetaminophen **OR** acetaminophen, albuterol   Vital Signs    Vitals:   04/09/21 2323 04/09/21 2356 04/10/21 0256 04/10/21 0519  BP:    (!) 159/85  Pulse:  75 89 83  Resp: 20 (!) 32 (!) 24 20  Temp:    98.7 F (37.1 C)  TempSrc:    Axillary  SpO2: 100% 99% 96% 97%  Weight:      Height:        Intake/Output Summary (Last 24 hours) at 04/10/2021 0640 Last data filed at 04/10/2021 0328 Gross per 24 hour  Intake --  Output 2300 ml  Net -2300 ml    Last 3 Weights 04/09/2021 04/07/2021 03/05/2021  Weight (lbs) 162 lb 7.7 oz 160 lb 0.9 oz 160 lb  Weight (kg) 73.7 kg 72.6 kg 72.576 kg      Telemetry    NSR with PVCs, PACs - Personally Reviewed  ECG    No new tracing - Personally Reviewed  Physical Exam   GEN: Comfortable, on HFNC, speaking in full sentence Neck: JVP mildly elevated Cardiac: RRR, no murmurs, rubs, or gallops.  Respiratory: Diminished throughout. No wheezes GI: Soft, nontender, non-distended  MS: No significant LE edema; warm Neuro:  Nonfocal  Psych: Normal affect   Labs    High Sensitivity  Troponin:   Recent Labs  Lab 04/07/21 1100 04/07/21 1229  TROPONINIHS 99* 88*       Chemistry Recent Labs  Lab 04/07/21 1100 04/07/21 1108 04/08/21 0650 04/09/21 0317 04/09/21 0725 04/10/21 0538  NA 138   < > 137 137  --  137  K 4.3   < > 4.5 5.4* 4.2 4.3  CL 105   < > 110 105  --  101  CO2 22  --  20* 16*  --  27  GLUCOSE 225*   < > 195* 151*  --  105*  BUN 27*   < > 37* 45*  --  40*  CREATININE 2.29*   < > 2.21* 1.93*  --  1.90*  CALCIUM 8.6*  --  8.1* 8.4*  --  8.2*  PROT 6.1*  --   --   --   --   --   ALBUMIN 2.7*  --   --   --   --   --   AST 32  --   --   --   --   --   ALT 41  --   --   --   --   --  ALKPHOS 87  --   --   --   --   --   BILITOT 1.0  --   --   --   --   --   GFRNONAA 30*  --  31* 37*  --  38*  ANIONGAP 11  --  7 16*  --  9   < > = values in this interval not displayed.      Hematology Recent Labs  Lab 04/07/21 1100 04/07/21 1108 04/07/21 2149 04/08/21 0500  WBC 13.7*  --   --  15.6*  RBC 4.87  --   --  4.73  HGB 14.9 16.3 15.3 14.4  HCT 47.3 48.0 45.0 45.5  MCV 97.1  --   --  96.2  MCH 30.6  --   --  30.4  MCHC 31.5  --   --  31.6  RDW 17.8*  --   --  17.1*  PLT 207  --   --  205     BNP Recent Labs  Lab 04/07/21 1100  BNP 1,899.5*      DDimer No results for input(s): DDIMER in the last 168 hours.   Radiology    DG CHEST PORT 1 VIEW  Result Date: 04/10/2021 CLINICAL DATA:  Hypoxia and shortness of breath EXAM: PORTABLE CHEST 1 VIEW COMPARISON:  04/07/2021 FINDINGS: Shallow inspiration. Cardiac enlargement with mild perihilar infiltration. This could indicate edema or pneumonia. No pleural effusions. No pneumothorax. Similar appearance to previous study. IMPRESSION: Cardiac enlargement with bilateral pulmonary perihilar infiltrates. Electronically Signed   By: Lucienne Capers M.D.   On: 04/10/2021 00:22   ECHOCARDIOGRAM COMPLETE  Result Date: 04/08/2021    ECHOCARDIOGRAM REPORT   Patient Name:   Kyle [name] Date of  Exam: 04/08/2021 Medical Rec #:  [id number]      Height:       70.0 in Accession #:    [id number]     Weight:       160.1 lb Date of Birth:  1951-06-19      BSA:          1.899 m Patient Age:    70 years       BP:           148/64 mmHg Patient Gender: M              HR:           62 bpm. Exam Location:  Inpatient Procedure: 2D Echo, Color Doppler and Cardiac Doppler STAT ECHO Indications:    I50.9* Heart failure (unspecified)  History:        Patient has prior history of Echocardiogram examinations, most                 recent 12/13/2019. CHF, COPD, Arrythmias:Atrial Fibrillation;                 Risk Factors:Diabetes, Hypertension and Dyslipidemia.  Sonographer:    Raquel Sarna Senior RDCS Referring Phys: [phone] CARINA M BROWN IMPRESSIONS  1. Hypokinesis of the inferolateral wall with overall mild LV dysfunction.  2. Left ventricular ejection fraction, by estimation, is 45 to 50%. The left ventricle has mildly decreased function. The left ventricle demonstrates regional wall motion abnormalities (see scoring diagram/findings for description). There is moderate left ventricular hypertrophy. Left ventricular diastolic parameters are consistent with Grade I diastolic dysfunction (impaired relaxation).  3. Right ventricular systolic function is normal. The right ventricular size is normal. There is moderately elevated pulmonary artery  systolic pressure.  4. Left atrial size was mildly dilated.  5. Right atrial size was mildly dilated.  6. The mitral valve is normal in structure. Mild mitral valve regurgitation. No evidence of mitral stenosis.  7. The aortic valve is normal in structure. Aortic valve regurgitation is moderate. Mild to moderate aortic valve stenosis.  8. The inferior vena cava is dilated in size with >50% respiratory variability, suggesting right atrial pressure of 8 mmHg. FINDINGS  Left Ventricle: Left ventricular ejection fraction, by estimation, is 45 to 50%. The left ventricle has mildly decreased function.  The left ventricle demonstrates regional wall motion abnormalities. The left ventricular internal cavity size was normal in size. There is moderate left ventricular hypertrophy. Left ventricular diastolic parameters are consistent with Grade I diastolic dysfunction (impaired relaxation). Right Ventricle: The right ventricular size is normal. Right ventricular systolic function is normal. There is moderately elevated pulmonary artery systolic pressure. The tricuspid regurgitant velocity is 3.16 m/s, and with an assumed right atrial pressure of 15 mmHg, the estimated right ventricular systolic pressure is 123456 mmHg. Left Atrium: Left atrial size was mildly dilated. Right Atrium: Right atrial size was mildly dilated. Pericardium: There is no evidence of pericardial effusion. Mitral Valve: The mitral valve is normal in structure. Mild mitral annular calcification. Mild mitral valve regurgitation. No evidence of mitral valve stenosis. Tricuspid Valve: The tricuspid valve is normal in structure. Tricuspid valve regurgitation is mild . No evidence of tricuspid stenosis. Aortic Valve: The aortic valve is normal in structure. Aortic valve regurgitation is moderate. Aortic regurgitation PHT measures 595 msec. Mild to moderate aortic stenosis is present. Aortic valve mean gradient measures 15.0 mmHg. Aortic valve peak gradient measures 27.7 mmHg. Aortic valve area, by VTI measures 1.33 cm. Pulmonic Valve: The pulmonic valve was not well visualized. Pulmonic valve regurgitation is not visualized. No evidence of pulmonic stenosis. Aorta: The aortic root is normal in size and structure. Venous: The inferior vena cava is dilated in size with greater than 50% respiratory variability, suggesting right atrial pressure of 8 mmHg. IAS/Shunts: No atrial level shunt detected by color flow Doppler. Additional Comments: Hypokinesis of the inferolateral wall with overall mild LV dysfunction.  LEFT VENTRICLE PLAX 2D LVIDd:         4.60 cm  LVIDs:         3.10 cm LV PW:         1.20 cm LV IVS:        1.40 cm LVOT diam:     2.10 cm LV SV:         78 LV SV Index:   41 LVOT Area:     3.46 cm  RIGHT VENTRICLE RV S prime:     12.50 cm/s TAPSE (M-mode): 1.8 cm LEFT ATRIUM             Index       RIGHT ATRIUM           Index LA diam:        3.60 cm 1.90 cm/m  RA Area:     24.20 cm LA Vol (A2C):   57.5 ml 30.28 ml/m RA Volume:   77.90 ml  41.03 ml/m LA Vol (A4C):   32.9 ml 17.33 ml/m LA Biplane Vol: 44.3 ml 23.33 ml/m  AORTIC VALVE AV Area (Vmax):    1.17 cm AV Area (Vmean):   1.31 cm AV Area (VTI):     1.33 cm AV Vmax:  263.00 cm/s AV Vmean:          185.000 cm/s AV VTI:            0.582 m AV Peak Grad:      27.7 mmHg AV Mean Grad:      15.0 mmHg LVOT Vmax:         89.15 cm/s LVOT Vmean:        69.900 cm/s LVOT VTI:          0.224 m LVOT/AV VTI ratio: 0.38 AI PHT:            595 msec  AORTA Ao Root diam: 2.90 cm TRICUSPID VALVE TR Peak grad:   39.9 mmHg TR Vmax:        316.00 cm/s  SHUNTS Systemic VTI:  0.22 m Systemic Diam: 2.10 cm Kirk Ruths MD Electronically signed by Kirk Ruths MD Signature Date/Time: 04/08/2021/9:16:49 AM    Final     Cardiac Studies   Echocardiogram 04/08/2021: Impressions:  1. Hypokinesis of the inferolateral wall with overall mild LV  dysfunction.   2. Left ventricular ejection fraction, by estimation, is 45 to 50%. The  left ventricle has mildly decreased function. The left ventricle  demonstrates regional wall motion abnormalities (see scoring  diagram/findings for description). There is moderate  left ventricular hypertrophy. Left ventricular diastolic parameters are  consistent with Grade I diastolic dysfunction (impaired relaxation).   3. Right ventricular systolic function is normal. The right ventricular  size is normal. There is moderately elevated pulmonary artery systolic  pressure.   4. Left atrial size was mildly dilated.   5. Right atrial size was mildly dilated.   6. The mitral  valve is normal in structure. Mild mitral valve  regurgitation. No evidence of mitral stenosis.   7. The aortic valve is normal in structure. Aortic valve regurgitation is  moderate. Mild to moderate aortic valve stenosis.   8. The inferior vena cava is dilated in size with >50% respiratory  variability, suggesting right atrial pressure of 8 mmHg.    Patient Profile     70 y.o. 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 Cardiology is consulted for acute on chronic heart failure exacerbation.  Assessment & Plan    #Acute on Chronic Hypoxic Respiratory Failure #COPD with Pulmonary Hypertension on Home O2 Patient presented with shortness of breath and cough x1 week after running out of his Trelegy inhaler. O2 sats initially in the 70s on room air. Improved with NRB. Suspect acute decompensation secondary to COPD exacerbation in combination with acute on chronic systolic HF exacerbation - Management of COPD per primary - Manage HFrEF as below   #Acute on Chronic Combined CHF #Non-Ischemic Cardiomyopathy: Likely precipitated by hypoxia and Afib with RVR. BNP markedly elevated at 1,899. CXR with some vascular congestion but no overt edema. TTE with LVEF of 45-50% with hypokinesis  of the  inferolateral wall, grade 1 diastolic dysfunction, and moderately elevated PASP. EF down from 65-70% in 11/2019 and wall motion abnormality is new. - Continue lasix '40mg'$  IV BID; good UOP to this dose - No ACEi/ARB/ARNI or MRA due to renal function. - Continue Coreg 6.'25mg'$  twice daily. - Increase imdur to '30mg'$  daily - Continue hydralazine '25mg'$  TID; up-titrate as tolerated - Continue to monitor daily weights, strict I/Os, and renal function. - Patient would benefit from repeat ischemic evaluation at some point with reduced EF and new wall motion abnormality but will hold off at this time given renal function.   #Paroxysmal Atrial Fibrillation CHADs-vasc 5. Has known history of Afib on apixaban for AC. Found to be in flutter on admission with RVR in the setting of hypoxia as detailed above. Converted to NSR spontaneously in the ER. - Continue Coreg 6.'25mg'$  twice daily. - Continue apixaban '5mg'$  BID - TSH normal   #Non-Sustained VT 24 beat run in the ER in the setting of hypoxia. Lytes normal - Continue Coreg 6.'25mg'$  twice daily - Keep K>4, Mg>2 - Continue to monitor on telemetry   #Elevated Troponin: Likely demand in the setting of acute on chronic hypoxic respiratory failure and Aflutter with RVR. TTE, however, did show interval decrease in LVEF to 45-50% from 65-70% with inferolateral WMA. No current anginal symptoms. Will likely need ischemic work-up once more clinically stable and renal function returns to baseline. Not a good candidate for lexiscan due to acute COPD exacerbation. -Will need ischemic work-up; can be done as out-patient once recovered from acute illness -Not a good candidate for lexiscan due to acute COPD exacerbation  -Avoiding contrast for now given AKI on CKD   #Hypertension Elevated this AM - Continue Coreg 6.'25mg'$  twice daily. - Increase imdur to '30mg'$  daily - Continue hydralazine '25mg'$  TID; will up-titrate as tolerated   #Hyperlipidemia LDL much better controlled and  at goal at <70. -Continue crestor '10mg'$  daily   #Type 2 Diabetes  - Hemoglobin A1c 7.0 this admission. - Management per primary team.   #PAD Patient with significant history of severe PAD s/p multiple interventions. Recent lower extremity dopplers in 02/2021 showed left ABI of 0.49 and right ABI of 0.80 and ultrasound was concerning for >70% stenosis of the distal anastomosis in the mid calf area. [name] recommended peripheral angiography to further define this area but patient wished to discuss this with wife. - Follow-up with Vascular Surgery as outpatient - Continue crestor '10mg'$  daily - Not on ASA '81mg'$  daily given need for apixaban   #Acute on CKD Stage III - Creatinine 2.29 on admission and improved to 1.9 today. Baseline 1.4 to 1.8. - Continue to monitor closely with diuresis      For questions or updates, please contact [place] Please consult [email or web address] for contact info under        Signed, Freada Bergeron, MD  04/10/2021, 6:40 AM

## 2021-04-10 NOTE — Progress Notes (Signed)
Family Medicine Teaching Service Daily Progress Note Intern Pager: [phone]  Patient name: Kyle [name] Medical record number: [id number] Date of birth: [birth year] Age: 70 y.o. Gender: male  Primary Care Provider: Alonna Buckler, MD Consultants: Cards Code Status: Full  Pt Overview and Major Events to Date:  6/13- Admitted  Assessment and Plan: Kyle [name] is a 70 y.o. male presenting with dyspnea. PMH is significant for atrial fibrillation anticoagulant Eliquis, HTN, HLD, CKD, CHF, COPD on 4-1/2 L nasal cannula.     Acute Hypoxic Respiratory Failure Patient had significant desaturations overnight.  Started on BiPaP.  Repeat chesct X-Ray shows improvement from admission. - Continue to wean as able - Continue oral steroids for 5 day course  HFrEF Fluid status improved.  Continue IV lasix.  Spoke with nurse regarding daily weights and  Cardiology following, appreciate recs  AKI on CKD stage 3a Cr- 2.3>2.21>1.93>1.90 - Continue good oral intake  FEN/GI: Carb controlled/Heart Healthy Diet PPx: Lovenox   Status is: Inpatient  Remains inpatient appropriate because:Inpatient level of care appropriate due to severity of illness  Dispo: The patient is from: Home              Anticipated d/c is to: Home              Patient currently is not medically stable to d/c.   Difficult to place patient No        Subjective:  Patient indicates he had worsening breathing and increased cough overnight.  Objective: Temp:  [98.1 F (36.7 C)-98.7 F (37.1 C)] 98.7 F (37.1 C) (06/16 0519) Pulse Rate:  [75-95] 95 (06/16 0830) Resp:  [19-32] 29 (06/16 0830) BP: (135-159)/(67-85) 144/78 (06/16 0800) SpO2:  [90 %-100 %] 97 % (06/16 0832) FiO2 (%):  [60 %-80 %] 60 % (06/16 0829) Physical Exam:  Physical Exam Constitutional:      General: He is not in acute distress. HENT:     Head: Normocephalic and atraumatic.     Mouth/Throat:     Mouth: Mucous membranes are moist.   Cardiovascular:     Rate and Rhythm: Normal rate and regular rhythm.  Pulmonary:     Breath sounds: Wheezing present. No rales.     Comments: Scattered wheezes throughout Skin:    General: Skin is warm.  Neurological:     General: No focal deficit present.     Mental Status: He is alert.     Laboratory: Recent Labs  Lab 04/07/21 1100 04/07/21 1108 04/07/21 2149 04/08/21 0500  WBC 13.7*  --   --  15.6*  HGB 14.9 16.3 15.3 14.4  HCT 47.3 48.0 45.0 45.5  PLT 207  --   --  205   Recent Labs  Lab 04/07/21 1100 04/07/21 1108 04/08/21 0650 04/09/21 0317 04/09/21 0725 04/10/21 0538  NA 138   < > 137 137  --  137  K 4.3   < > 4.5 5.4* 4.2 4.3  CL 105   < > 110 105  --  101  CO2 22  --  20* 16*  --  27  BUN 27*   < > 37* 45*  --  40*  CREATININE 2.29*   < > 2.21* 1.93*  --  1.90*  CALCIUM 8.6*  --  8.1* 8.4*  --  8.2*  PROT 6.1*  --   --   --   --   --   BILITOT 1.0  --   --   --   --   --  ALKPHOS 87  --   --   --   --   --   ALT 41  --   --   --   --   --   AST 32  --   --   --   --   --   GLUCOSE 225*   < > 195* 151*  --  105*   < > = values in this interval not displayed.      Imaging/Diagnostic Tests: EXAM: PORTABLE CHEST 1 VIEW   COMPARISON:  04/07/2021   FINDINGS: Shallow inspiration. Cardiac enlargement with mild perihilar infiltration. This could indicate edema or pneumonia. No pleural effusions. No pneumothorax. Similar appearance to previous study.   IMPRESSION: Cardiac enlargement with bilateral pulmonary perihilar infiltrates.     Electronically Signed   By: Lucienne Capers M.D.   On: 04/10/2021 00:22  Delora Fuel, MD 04/10/2021, 1:45 PM PGY-1, [place] Intern pager: [phone], text pages welcome

## 2021-04-10 NOTE — Progress Notes (Signed)
Heart Failure Stewardship Pharmacist Progress Note   PCP: Alonna Buckler, MD PCP-Cardiologist: Peter Martinique, MD    HPI:  70 yo M with PMH of HTN, NICM, HFimpEF, PAD, afib, pulmonary HTN, T2DM, CKD III, and HLD. He presented to the ED on 6/13 with shortness of breath. He was found to be in afib RVR on admission. CXR with cardiomegaly, vascular congestion and bibasilar atelectasis. An ECHO was done on 6/14 and LVEF was 45-50% (65-70% in 11/2019; 20% in 07/2014).  Current HF Medications: Furosemide 40 mg IV BID Carvedilol 6.25 mg BID Hydralazine 25 mg TID Imdur 30 mg daily  Prior to admission HF Medications: Furosemide 20 mg daily Carvedilol 6.25 mg BID  Pertinent Lab Values: Serum creatinine 1.90, BUN 40, Potassium 4.3, Sodium 137, BNP 1899.5, Magnesium 2.1  Vital Signs: Weight: 162 lbs (admission weight: 160 lbs) Blood pressure: 140-150/80s  Heart rate: 90s  Medication Assistance / Insurance Benefits Check: Does the patient have prescription insurance?  Yes Type of insurance plan: [employer]  Outpatient Pharmacy:  Prior to admission outpatient pharmacy: [facility] Is the patient willing to use [facility] at discharge? Yes Is the patient willing to transition their outpatient pharmacy to utilize a [facility] outpatient pharmacy?   Pending     Assessment: 1. Acute on chronic systolic CHF (EF [id number]), due to NICM. NYHA class III symptoms. SCr improving. - Continue furosemide 40 mg IV BID - Continue carvedilol 6.25 mg BID - No ACE/ARB/ARNI or spironolactone with AKI on CKD III - Consider starting SGLT2i prior to discharge pending SCr trends - Continue hydralazine 25 mg TID. May need to increase to 50 mg TID if BP remains elevated. - Agree with increasing Imdur to 30 mg daily   Plan: 1) Medication changes recommended at this time: - Agree with changes above; can increase hydralazine next for further BP reduction  2) Patient assistance: - None pending at this  time - HF TOC appt made for 6/22  3)  Education  - To be completed prior to discharge  Kerby Nora, PharmD, BCPS Heart Failure Stewardship Pharmacist Phone [phone]

## 2021-04-10 NOTE — Progress Notes (Signed)
OT Cancellation Note  Patient Details Name: Kyle [name] MRN: [id number] DOB: 04-14-1951   Cancelled Treatment:    Reason Eval/Treat Not Completed: Medical issues which prohibited therapy;Other (comment) pt on Bipap with RT present, RT working to get him back on [place] O2, will check back as time allows for OT session.  Kyle Ports K., COTA/L Acute Rehabilitation Services [phone]   Kyle [name] 04/10/2021, 8:29 AM

## 2021-04-10 NOTE — Progress Notes (Signed)
Physical Therapy Treatment Patient Details Name: Kyle [name] MRN: [id number] DOB: 05-12-1951 Today's Date: 04/10/2021    History of Present Illness Pt is a 70 y/o male admitted secondary to increased SOB. Found to have acute respiratory failure, likely from CHF and COPD exacerbation. PMH includes PAD, a fib, DM, and COPD on 4.5 L.    PT Comments    Patient eager to get OOB despite dyspnea (which he denies). Pt on 10L HFNC (weaned down from BiPAP earlier today). Patient with significant LOB posteriorly during transfer and required min assist to recover. Sats down to 87% on 10L with ~3 minutes to return to 91%.     Follow Up Recommendations  No PT follow up     Equipment Recommendations  None recommended by PT    Recommendations for Other Services       Precautions / Restrictions Precautions Precautions: Other (comment) Precaution Comments: Watch O2 sats    Mobility  Bed Mobility Overal bed mobility: Modified Independent                  Transfers Overall transfer level: Needs assistance Equipment used: None Transfers: Sit to/from Stand Sit to Stand: Min assist         General transfer comment: initial attempt pt with LOB posteriorly and flopped back down into sitting EOB with further posterior LOB and nearly laid out across the bed (assist to prevent hitting his head on the opposite bedrail). He reports this happens sometimes when he tries to get up. Denies fall to floor, but states he can get up from floor (he gets down on floor sometimes to play with his dog). Second attempt pt steady.  Ambulation/Gait Ambulation/Gait assistance: Min Web designer (Feet): 3 Feet Assistive device: None Gait Pattern/deviations: Staggering right;Step-to pattern Gait velocity: Decreased   General Gait Details: on 10L HFNC and no portable O2 available for further ambulation (cannot use extender with HFNC); sats decr 87% and slowly incr to 91% after seated  rest   Stairs             Wheelchair Mobility    Modified Rankin (Stroke Patients Only)       Balance Overall balance assessment: Mild deficits observed, not formally tested                                          Cognition Arousal/Alertness: Awake/alert Behavior During Therapy: WFL for tasks assessed/performed Overall Cognitive Status: Within Functional Limits for tasks assessed                                 General Comments: Decreased awareness of deficits as he does not wear oxygen at work      Exercises      General Comments General comments (skin integrity, edema, etc.): Educated on pursed lip breathing and pt verbalized familiar saying "smell the roses, blow out the candles). He did not utlize technique, however unless cued to do so (denies SOB, despite incr RR and decr sats)      Pertinent Vitals/Pain Pain Assessment: No/denies pain    Home Living                      Prior Function            PT Goals (current goals  can now be found in the care plan section) Acute Rehab PT Goals Patient Stated Goal: to go home PT Goal Formulation: With patient Time For Goal Achievement: 04/22/21 Potential to Achieve Goals: Fair Progress towards PT goals: Progressing toward goals    Frequency    Min 3X/week      PT Plan Current plan remains appropriate    Co-evaluation              AM-PAC PT "6 Clicks" Mobility   Outcome Measure  Help needed turning from your back to your side while in a flat bed without using bedrails?: None Help needed moving from lying on your back to sitting on the side of a flat bed without using bedrails?: None Help needed moving to and from a bed to a chair (including a wheelchair)?: A Little Help needed standing up from a chair using your arms (e.g., wheelchair or bedside chair)?: A Little Help needed to walk in hospital room?: A Little Help needed climbing 3-5 steps with a  railing? : A Little 6 Click Score: 20    End of Session Equipment Utilized During Treatment: Gait belt;Oxygen Activity Tolerance: Treatment limited secondary to medical complications (Comment) (limited by dyspea, incr need for O2) Patient left: with call bell/phone within reach;in chair (on bed in ED)   PT Visit Diagnosis: Unsteadiness on feet (R26.81)     Time: [id number] PT Time Calculation (min) (ACUTE ONLY): 20 min  Charges:  $Therapeutic Activity: 8-22 mins                      Arby Barrette, PT Pager [phone]    Rexanne Mano 04/10/2021, 10:34 AM

## 2021-04-11 ENCOUNTER — Other Ambulatory Visit (HOSPITAL_COMMUNITY): Payer: Self-pay

## 2021-04-11 LAB — GLUCOSE, CAPILLARY
Glucose-Capillary: 78 mg/dL (ref 70–99)
Glucose-Capillary: 286 mg/dL — ABNORMAL HIGH (ref 70–99)
Glucose-Capillary: 146 mg/dL — ABNORMAL HIGH (ref 70–99)

## 2021-04-11 LAB — BASIC METABOLIC PANEL
Anion gap: 11 (ref 5–15)
BUN: 42 mg/dL — ABNORMAL HIGH (ref 8–23)
CO2: 25 mmol/L (ref 22–32)
Calcium: 8.9 mg/dL (ref 8.9–10.3)
Chloride: 101 mmol/L (ref 98–111)
Creatinine, Ser: 1.68 mg/dL — ABNORMAL HIGH (ref 0.61–1.24)
GFR, Estimated: 44 mL/min — ABNORMAL LOW (ref >60)
Glucose, Bld: 110 mg/dL — ABNORMAL HIGH (ref 70–99)
Potassium: 4.3 mmol/L (ref 3.5–5.1)
Sodium: 137 mmol/L (ref 135–145)

## 2021-04-11 MED ORDER — FUROSEMIDE 40 MG PO TABS
40.0000 mg | ORAL_TABLET | Freq: Every day | ORAL | 0 refills | Status: DC
  Filled 2021-04-11: qty 30, 30d supply, fill #0

## 2021-04-11 MED ORDER — IPRATROPIUM BROMIDE 0.02 % IN SOLN: 0.5000 mg | Freq: Four times a day (QID) | RESPIRATORY_TRACT | 11 refills | Status: DC

## 2021-04-11 MED ORDER — FUROSEMIDE 40 MG PO TABS
40.0000 mg | ORAL_TABLET | Freq: Every day | ORAL | Status: DC
  Administered 2021-04-12: 40 mg via ORAL
  Filled 2021-04-11: qty 1

## 2021-04-11 MED ORDER — ALBUTEROL SULFATE HFA 108 (90 BASE) MCG/ACT IN AERS
2.0000 | INHALATION_SPRAY | Freq: Four times a day (QID) | RESPIRATORY_TRACT | Status: DC | PRN
  Filled 2021-04-11: qty 6.7

## 2021-04-11 MED ORDER — UMECLIDINIUM BROMIDE 62.5 MCG/INH IN AEPB
1.0000 | INHALATION_SPRAY | Freq: Every day | RESPIRATORY_TRACT | Status: DC
  Filled 2021-04-11: qty 7

## 2021-04-11 MED ORDER — BUDESONIDE 0.25 MG/2ML IN SUSP: 0.2500 mg | Freq: Two times a day (BID) | RESPIRATORY_TRACT | 11 refills | Status: DC

## 2021-04-11 MED ORDER — MOMETASONE FURO-FORMOTEROL FUM 100-5 MCG/ACT IN AERO
2.0000 | INHALATION_SPRAY | Freq: Two times a day (BID) | RESPIRATORY_TRACT | Status: DC
  Filled 2021-04-11: qty 8.8

## 2021-04-11 MED ORDER — MOMETASONE FURO-FORMOTEROL FUM 100-5 MCG/ACT IN AERO
2.0000 | INHALATION_SPRAY | Freq: Two times a day (BID) | RESPIRATORY_TRACT | Status: DC
  Administered 2021-04-11 – 2021-04-12 (×2): 2 via RESPIRATORY_TRACT
  Filled 2021-04-11: qty 8.8

## 2021-04-11 MED ORDER — TRELEGY ELLIPTA 100-62.5-25 MCG/INH IN AEPB: 1.0000 | INHALATION_SPRAY | Freq: Every day | RESPIRATORY_TRACT | 11 refills | Status: DC

## 2021-04-11 MED ORDER — HYDRALAZINE HCL 25 MG PO TABS
25.0000 mg | ORAL_TABLET | Freq: Three times a day (TID) | ORAL | 0 refills | Status: DC
  Filled 2021-04-11: qty 90, 30d supply, fill #0

## 2021-04-11 MED ORDER — ELIQUIS 5 MG PO TABS
1.0000 | ORAL_TABLET | Freq: Two times a day (BID) | ORAL | 0 refills | Status: DC
  Filled 2021-04-11: qty 60, 30d supply, fill #0

## 2021-04-11 MED ORDER — UMECLIDINIUM BROMIDE 62.5 MCG/INH IN AEPB
1.0000 | INHALATION_SPRAY | Freq: Every day | RESPIRATORY_TRACT | Status: DC
  Administered 2021-04-12: 1 via RESPIRATORY_TRACT
  Filled 2021-04-11: qty 7

## 2021-04-11 MED ORDER — ALBUTEROL SULFATE HFA 108 (90 BASE) MCG/ACT IN AERS: 2.0000 | INHALATION_SPRAY | Freq: Four times a day (QID) | RESPIRATORY_TRACT | 2 refills | Status: DC | PRN

## 2021-04-11 MED ORDER — ISOSORBIDE MONONITRATE ER 30 MG PO TB24
30.0000 mg | ORAL_TABLET | Freq: Every day | ORAL | 0 refills | Status: DC
  Filled 2021-04-11: qty 30, 30d supply, fill #0

## 2021-04-11 NOTE — Discharge Instructions (Addendum)
For insulin (Levemir) Use home supply  Patient assistance application submitted and pending. You may hear from [employer] about your application  For Inhaler (Trelegy) Take Trelegy inhaler once daily until it runs out. Patient assistance application submitted and pending. You may hear from [place] about your application Then take Dulera twice daily and Incruse once daily until they run out  Then take nebulized medications from internet pharmacy ([facility]), ipratropium, albuterol, and budesonide as prescribed using the nebulizer machine Carry your Albuterol inhaler with you at all times and use when needed for shortness of breath  Watch this video for instructions on using an HFA inhaler: [email or web address] Watch this video for instructions on using your nebulizer machine: [email or web address].[employer]ee  For blood thinning (Eliquis) Take Eliquis '5mg'$  twice daily with supply provided by the hospital. This supply should last 30 days  Follow up with new PCP for possible switch to Xarelto with patient assistance   For blood pressure and heart failure STOP taking amlodipine START taking hydralazine and Imdur INCREASE your furosemide (water pill) to '40mg'$  daily Continue taking carvedilol and rosuvastatin   For any other medications Obtain from your usual pharmacy    New Doctor (PCP): Lyndee Hensen, DO Phone number: [phone] Address: [facility] [place], [place], [place] [address]

## 2021-04-11 NOTE — Progress Notes (Signed)
Physical Therapy Treatment Patient Details Name: Kyle [name] MRN: [id number] DOB: 05/11/1951 Today's Date: 04/11/2021    History of Present Illness Pt is a 70 y/o male admitted secondary to increased SOB. Found to have acute respiratory failure, likely from CHF and COPD exacerbation. PMH includes PAD, a fib, DM, and COPD on 4.5 L.    PT Comments    Pt supine on arrival and returned to bed end of session per pt request. Pt on 5L at rest with drop to 88% with attempt at 4L for EOB. Pt required 8L with gait and standing rests with use of RW to maintains saturations >90%. Despite education of pulmonary function and monitoring with 2 probes to ensure accuracy pt does not demonstrate awareness of deficits or need for supplemental oxygen. Pt only wears O2 in home and reports no plan to wear it at work or out of the house because it is too heavy despite stating his sats are in the mid to high 80s at baseline when checked at home.  Education for pulmonary function, monitoring, walking and HEP provided with limited carryover. Will continue to follow.   HR 94-102 with activity   Follow Up Recommendations  No PT follow up     Equipment Recommendations  None recommended by PT    Recommendations for Other Services       Precautions / Restrictions Precautions Precautions: Other (comment) Precaution Comments: Watch O2 sats    Mobility  Bed Mobility Overal bed mobility: Modified Independent                  Transfers Overall transfer level: Needs assistance   Transfers: Sit to/from Stand Sit to Stand: Supervision         General transfer comment: supervision for line management with cues for breathing technique. pt with desaturation on 4L to 88% with rise to standing and titrated to 6L with sats 94%  Ambulation/Gait Ambulation/Gait assistance: Min guard Gait Distance (Feet): 200 Feet Assistive device: Rolling walker (2 wheeled) Gait Pattern/deviations: Step-through  pattern;Decreased stride length   Gait velocity interpretation: 1.31 - 2.62 ft/sec, indicative of limited community ambulator General Gait Details: pt with desaturation to 87% on 6L with gait with cues for standing rest and breathing technique. Titrated to 8L for gait with sats 90-94% on 8L with gait with 2 standing rests, cues for posture and use of RW   Stairs             Wheelchair Mobility    Modified Rankin (Stroke Patients Only)       Balance Overall balance assessment: Mild deficits observed, not formally tested                                          Cognition Arousal/Alertness: Awake/alert Behavior During Therapy: WFL for tasks assessed/performed Overall Cognitive Status: Impaired/Different from baseline Area of Impairment: Safety/judgement                         Safety/Judgement: Decreased awareness of deficits     General Comments: pt continues to state he has no intention of wearing his oxygen out of the house and despite education with desaturation is unable to recognize physical pulmonary limitations      Exercises General Exercises - Lower Extremity Long Arc Quad: AROM;Both;Seated;15 reps Hip Flexion/Marching: AROM;Both;Seated;15 reps    General Comments  Pertinent Vitals/Pain Pain Assessment: No/denies pain    Home Living                      Prior Function            PT Goals (current goals can now be found in the care plan section) Progress towards PT goals: Progressing toward goals    Frequency    Min 3X/week      PT Plan Current plan remains appropriate    Co-evaluation              AM-PAC PT "6 Clicks" Mobility   Outcome Measure  Help needed turning from your back to your side while in a flat bed without using bedrails?: None Help needed moving from lying on your back to sitting on the side of a flat bed without using bedrails?: None Help needed moving to and from a bed  to a chair (including a wheelchair)?: None Help needed standing up from a chair using your arms (e.g., wheelchair or bedside chair)?: A Little Help needed to walk in hospital room?: A Little Help needed climbing 3-5 steps with a railing? : A Little 6 Click Score: 21    End of Session Equipment Utilized During Treatment: Oxygen Activity Tolerance: Patient tolerated treatment well Patient left: in bed;with call bell/phone within reach;with bed alarm set Nurse Communication: Mobility status PT Visit Diagnosis: Other abnormalities of gait and mobility (R26.89)     Time: [id number] PT Time Calculation (min) (ACUTE ONLY): 19 min  Charges:  $Gait Training: 8-22 mins                     Lavance Beazer P, PT Acute Rehabilitation Services Pager: [phone] Office: [place] 04/11/2021, 1:58 PM

## 2021-04-11 NOTE — Progress Notes (Addendum)
Family Medicine Teaching Service Daily Progress Note Intern Pager: [phone]  Patient name: Kyle [name] Medical record number: [id number] Date of birth: 18-May-1951 Age: 70 y.o. Gender: male  Primary Care Provider: Alonna Buckler, MD Consultants: Cardiology Code Status: Full  Pt Overview and Major Events to Date:  04/07/2021: Admitted to FPTS  Assessment and Plan: Kyle [name] is a 70 year old male presented with dyspnea and found to have severe acute hypoxic respiratory failure.  PMH is significant for atrial fibrillation on Eliquis, HTN, HLD, CKD IIIa, HFmEF, COPD on 4.5 L O2  Acute hypoxic respiratory failure, likely due to acute on chronic HFmEF vs COPD exacerbation Satting well on 8L, overall feels much improved.UO~2.4 L, weight 73.3 kg, dry weight 73.7 kg. --Cardiology following, appreciate recommendations --c/w Lasix 40 mg IV BID --c/w Coreg 6.25 mg BID --c/w Imdur 30 mg --c/w Hydral 25 mg TID --Strict I's and O's --Daily weights --Renal function panel in am --Outpatient cardiology appointment  Paroxysmal A-fib I Frequent PVC's, NSVT, non-sustained VT Converted NSR. Keep K>4, Mag>2 --c/w Coreg 6.25 mg BID --c/w cardiac telemetry --BMP in am  HTN SBP 122-14, DBP 73-84, stable --c/w Coreg 6.25 mg BID --c/w Imdur 30 mg --c/w Hydral 25 mg TID   AKI on CKD IIIa sCr 1.68 this am, improving from 2.21>1.93>1.90 --Monitor closely --BMP in am     FEN/GI: Heart healthy diet/ carb-modified PPx: Eliquis   Status is: Inpatient  Remains inpatient appropriate because:IV treatments appropriate due to intensity of illness or inability to take PO  Dispo: The patient is from: Home              Anticipated d/c is to: Home              Patient currently is not medically stable to d/c.   Difficult to place patient No        Subjective:  No acute overnight events Patient states that he feels much better this morning, his breathing has improved and he feels  like his chest is really open and his sputum is all gone.  Objective: Temp:  [97.8 F (36.6 C)-98.7 F (37.1 C)] 97.8 F (36.6 C) (06/17 0351) Pulse Rate:  [80-105] 80 (06/17 0351) Resp:  [16-29] 18 (06/17 0351) BP: (122-144)/(73-84) 122/84 (06/17 0605) SpO2:  [90 %-97 %] 92 % (06/17 0351) FiO2 (%):  [60 %] 60 % (06/16 0829) Weight:  [158 lb 12.8 oz (72 kg)-161 lb 9.6 oz (73.3 kg)] 161 lb 9.6 oz (73.3 kg) (06/17 0602) Physical Exam: General: Pleasant elderly male sitting in bed, NAD, on 8L HFNC Cardiovascular: RRR, no m/r/g Respiratory: Decreased breath sounds, No wheezing or crackles Abdomen: Soft, Nondistended, nontender,BS+ Extremities: Trace ankle edema Neuro:AAO x3 Psych: Normal mood and affect  Laboratory: Recent Labs  Lab 04/07/21 1100 04/07/21 1108 04/07/21 2149 04/08/21 0500  WBC 13.7*  --   --  15.6*  HGB 14.9 16.3 15.3 14.4  HCT 47.3 48.0 45.0 45.5  PLT 207  --   --  205   Recent Labs  Lab 04/07/21 1100 04/07/21 1108 04/09/21 0317 04/09/21 0725 04/10/21 0538 04/11/21 0213  NA 138   < > 137  --  137 137  K 4.3   < > 5.4* 4.2 4.3 4.3  CL 105   < > 105  --  101 101  CO2 22   < > 16*  --  27 25  BUN 27*   < > 45*  --  40* 42*  CREATININE 2.29*   < > 1.93*  --  1.90* 1.68*  CALCIUM 8.6*   < > 8.4*  --  8.2* 8.9  PROT 6.1*  --   --   --   --   --   BILITOT 1.0  --   --   --   --   --   ALKPHOS 87  --   --   --   --   --   ALT 41  --   --   --   --   --   AST 32  --   --   --   --   --   GLUCOSE 225*   < > 151*  --  105* 110*   < > = values in this interval not displayed.    Imaging/Diagnostic Tests: No results found.   Honor Junes, MD 04/11/2021, 7:00 AM PGY-1, [place] Intern pager: [phone], text pages welcome

## 2021-04-11 NOTE — Progress Notes (Signed)
Occupational Therapy Treatment Patient Details Name: Kyle [name] MRN: [id number] DOB: 05-Feb-1951 Today's Date: 04/11/2021    History of present illness Pt is a 70 y/o male admitted secondary to increased SOB. Found to have acute respiratory failure, likely from CHF and COPD exacerbation. PMH includes PAD, a fib, DM, and COPD on 4.5 L.   OT comments  Pt received supine in bed agreeable to OT intervention. Pt on 10 L HFNC during session, difficult to obtain SpO2 reading even when finger probe changed. Pt currently requires min guard assist for functional mobility greater than a household distance with RW, pt additionally completed toilet transfer with no AD with gross supervision for safety. Did get one reading of 92% during ambulate with RR up to 35. DC plan remains appropriate, will follow acutely per POC.   Follow Up Recommendations  No OT follow up;Supervision - Intermittent    Equipment Recommendations  3 in 1 bedside commode;Other (comment) (to use as shower chair)    Recommendations for Other Services      Precautions / Restrictions Precautions Precautions: Other (comment) Precaution Comments: Watch O2 sats, 10 L HFNC Restrictions Weight Bearing Restrictions: No       Mobility Bed Mobility Overal bed mobility: Modified Independent             General bed mobility comments: no physical assist needed    Transfers Overall transfer level: Needs assistance Equipment used: None Transfers: Sit to/from Stand Sit to Stand: Min guard         General transfer comment: minguard to rise from EOB, assist needed mostly for line mgmt    Balance Overall balance assessment: No apparent balance deficits (not formally assessed)                                         ADL either performed or assessed with clinical judgement   ADL Overall ADL's : Needs assistance/impaired     Grooming: Wash/dry hands;Standing;Supervision/safety                    Toilet Transfer: Supervision/safety;Ambulation;Regular Glass blower/designer Details (indicate cue type and reason): standing to urinate with no AD         Functional mobility during ADLs: Min guard;Supervision/safety;Rolling walker General ADL Comments: pt completing functional mobility with and without AD with supervision - minguard assist. difficult to obtain accurate SpO2 reading but did obtain one reading of 92%     Vision       Perception     Praxis      Cognition Arousal/Alertness: Awake/alert Behavior During Therapy: WFL for tasks assessed/performed Overall Cognitive Status: Within Functional Limits for tasks assessed                                          Exercises     Shoulder Instructions       General Comments pt on 10 L HFNC, diffculty to obatin accute SpO2 reading even with pulse ox changed, did get one reading of 92% during ambulation, pt RR up to 35 breaths per minute with mobility, pt denies any SOB    Pertinent Vitals/ Pain       Pain Assessment: No/denies pain  Home Living  Prior Functioning/Environment              Frequency  Min 2X/week        Progress Toward Goals  OT Goals(current goals can now be found in the care plan section)  Progress towards OT goals: Progressing toward goals  Acute Rehab OT Goals Patient Stated Goal: to go home OT Goal Formulation: With patient Time For Goal Achievement: 04/22/21 Potential to Achieve Goals: Good  Plan Discharge plan remains appropriate;Frequency remains appropriate    Co-evaluation                 AM-PAC OT "6 Clicks" Daily Activity     Outcome Measure   Help from another person eating meals?: None Help from another person taking care of personal grooming?: None Help from another person toileting, which includes using toliet, bedpan, or urinal?: None Help from another person bathing (including  washing, rinsing, drying)?: A Little Help from another person to put on and taking off regular upper body clothing?: None Help from another person to put on and taking off regular lower body clothing?: None 6 Click Score: 23    End of Session Equipment Utilized During Treatment: Gait belt;Rolling walker  OT Visit Diagnosis: Unsteadiness on feet (R26.81)   Activity Tolerance Patient tolerated treatment well   Patient Left in bed;with call bell/phone within reach;with bed alarm set   Nurse Communication Mobility status;Other (comment) (difficult to obtain accurate SpO2 reading, changed finger probe)        Time: [id number] OT Time Calculation (min): 19 min  Charges: OT General Charges $OT Visit: 1 Visit OT Treatments $Self Care/Home Management : 8-22 mins  [name]., COTA/L Acute Rehabilitation Services [phone]     [name] 04/11/2021, 9:08 AM

## 2021-04-12 DIAGNOSIS — I739 Peripheral vascular disease, unspecified: Secondary | ICD-10-CM | POA: Diagnosis not present

## 2021-04-12 DIAGNOSIS — Z794 Long term (current) use of insulin: Secondary | ICD-10-CM

## 2021-04-12 LAB — RENAL FUNCTION PANEL
Albumin: 2.2 g/dL — ABNORMAL LOW (ref 3.5–5.0)
Anion gap: 10 (ref 5–15)
BUN: 51 mg/dL — ABNORMAL HIGH (ref 8–23)
CO2: 28 mmol/L (ref 22–32)
Calcium: 8.9 mg/dL (ref 8.9–10.3)
Chloride: 100 mmol/L (ref 98–111)
Creatinine, Ser: 2.02 mg/dL — ABNORMAL HIGH (ref 0.61–1.24)
GFR, Estimated: 35 mL/min — ABNORMAL LOW (ref >60)
Glucose, Bld: 264 mg/dL — ABNORMAL HIGH (ref 70–99)
Phosphorus: 3.7 mg/dL (ref 2.5–4.6)
Potassium: 4.2 mmol/L (ref 3.5–5.1)
Sodium: 138 mmol/L (ref 135–145)

## 2021-04-12 LAB — GLUCOSE, CAPILLARY
Glucose-Capillary: 248 mg/dL — ABNORMAL HIGH (ref 70–99)
Glucose-Capillary: 230 mg/dL — ABNORMAL HIGH (ref 70–99)

## 2021-04-12 MED ORDER — ADJUSTABLE COMMODE 3-IN-1 MISC
0 refills | Status: DC
  Filled 2021-04-12: qty 1, fill #0

## 2021-04-12 MED ORDER — INSULIN DETEMIR 100 UNIT/ML FLEXPEN: 28.0000 [IU] | Freq: Every morning | SUBCUTANEOUS | Status: DC

## 2021-04-12 MED ORDER — IPRATROPIUM-ALBUTEROL 0.5-2.5 (3) MG/3ML IN SOLN: 3.0000 mL | Freq: Four times a day (QID) | RESPIRATORY_TRACT | Status: DC | PRN

## 2021-04-12 NOTE — Progress Notes (Signed)
Patient can bathe self. Everything provided.

## 2021-04-12 NOTE — TOC Transition Note (Signed)
Transition of Care [facility]) - CM/SW Discharge Note   Patient Details  Name: Kyle [name] MRN: [id number] Date of Birth: 10-Jan-1951  [facility] of [facility]) CM/SW Contact:  Carles Collet, RN Phone Number: 04/12/2021, 3:56 PM   Clinical Narrative:    Per MD patient has home oxygen. Nebulizer has been ordered (6/15). 3/1 to come to room before he leaves. Confirmed w MD that his meds were filled by [facility] pharmacy, specifically inquired about albuterol inhaler, and they assured it was. No other CM needs identified.      Final next level of care: Home/Self Care Barriers to Discharge: No Barriers Identified   Patient Goals and CMS Choice Patient states their goals for this hospitalization and ongoing recovery are:: Wants to get better   Choice offered to / list presented to : NA  Discharge Placement                       Discharge Plan and Services In-house Referral: NA Discharge Planning Services: CM Consult, Other - See comment (unable to offer med assistance to insured patient) Post Acute Care Choice: NA          DME Arranged: 3-N-1 DME Agency: [employer] Date DME Agency Contacted: 04/12/21 Time DME Agency Contacted: 68 Representative spoke with at DME Agency: [place]: NA [place] Agency: NA Date [place]: 04/09/21 Time [place]: 69 Representative spoke with at [place]: [place] ([employer]) Interventions     Readmission Risk Interventions Readmission Risk Prevention Plan 02/22/2020 01/24/2020 04/26/2019  Transportation Screening Complete Complete Complete  PCP or Specialist Appt within 5-7 Days - - Complete  Home Care Screening - - Complete  Medication Review (RN CM) - - Complete  Medication Review Press photographer) Complete Complete -  PCP or Specialist appointment within 3-5 days of discharge Complete Complete -  HRI or Home Care Consult Complete Complete -  SW Recovery Care/Counseling Consult Complete  Complete -  Palliative Care Screening Not Applicable Not Applicable -  [place] Not Applicable Not Applicable -  Some recent data might be hidden

## 2021-04-12 NOTE — Progress Notes (Addendum)
Progress Note  Patient Name: Kyle [name] Date of Encounter: 04/12/2021  [facility] Cardiologist: Peter Martinique, MD   Subjective   Breathing is better - net negative another 1.2L overnight. Creatinine has increased up to 2.02 - may be at end-diuresis.  Inpatient Medications    Scheduled Meds:  apixaban  5 mg Oral BID   budesonide (PULMICORT) nebulizer solution  0.25 mg Nebulization BID   carvedilol  6.25 mg Oral BID WC   furosemide  40 mg Oral Daily   guaiFENesin  600 mg Oral BID   hydrALAZINE  25 mg Oral Q8H   insulin aspart  0-20 Units Subcutaneous TID WC   insulin detemir  28 Units Subcutaneous Daily   isosorbide mononitrate  30 mg Oral Daily   mometasone-formoterol  2 puff Inhalation BID   rosuvastatin  10 mg Oral q1800   umeclidinium bromide  1 puff Inhalation Daily   Continuous Infusions:  PRN Meds: acetaminophen **OR** acetaminophen, albuterol, ipratropium-albuterol   Vital Signs    Vitals:   04/12/21 0833 04/12/21 0836 04/12/21 0837 04/12/21 1356  BP:    (!) 144/77  Pulse:    85  Resp:    17  Temp:    98.8 F (37.1 C)  TempSrc:      SpO2: 94% 94% 94% 92%  Weight:      Height:        Intake/Output Summary (Last 24 hours) at 04/12/2021 1358 Last data filed at 04/12/2021 0745 Gross per 24 hour  Intake 440 ml  Output 1450 ml  Net -1010 ml   Last 3 Weights 04/12/2021 04/11/2021 04/11/2021  Weight (lbs) 154 lb 1.6 oz 156 lb 9.6 oz 161 lb 9.6 oz  Weight (kg) 69.9 kg 71.033 kg 73.3 kg      Telemetry   NSR, PAC/PVC's- Personally Reviewed  ECG    N/A  Physical Exam   GEN: Awake, NAD Neck: No JVP Cardiac: RRR, no murmurs, rubs, or gallops.  Respiratory: Diminished throughout. No wheezes GI: Soft, nontender, non-distended  MS: No significant LE edema; warm Neuro:  Nonfocal  Psych: Normal affect   Labs    High Sensitivity Troponin:   Recent Labs  Lab 04/07/21 1100 04/07/21 1229  TROPONINIHS 99* 88*      Chemistry Recent Labs  Lab  04/07/21 1100 04/07/21 1108 04/10/21 0538 04/11/21 0213 04/12/21 0408  NA 138   < > 137 137 138  K 4.3   < > 4.3 4.3 4.2  CL 105   < > 101 101 100  CO2 22   < > '27 25 28  '$ GLUCOSE 225*   < > 105* 110* 264*  BUN 27*   < > 40* 42* 51*  CREATININE 2.29*   < > 1.90* 1.68* 2.02*  CALCIUM 8.6*   < > 8.2* 8.9 8.9  PROT 6.1*  --   --   --   --   ALBUMIN 2.7*  --   --   --  2.2*  AST 32  --   --   --   --   ALT 41  --   --   --   --   ALKPHOS 87  --   --   --   --   BILITOT 1.0  --   --   --   --   GFRNONAA 30*   < > 38* 44* 35*  ANIONGAP 11   < > '9 11 10   '$ < > =  values in this interval not displayed.     Hematology Recent Labs  Lab 04/07/21 1100 04/07/21 1108 04/07/21 2149 04/08/21 0500  WBC 13.7*  --   --  15.6*  RBC 4.87  --   --  4.73  HGB 14.9 16.3 15.3 14.4  HCT 47.3 48.0 45.0 45.5  MCV 97.1  --   --  96.2  MCH 30.6  --   --  30.4  MCHC 31.5  --   --  31.6  RDW 17.8*  --   --  17.1*  PLT 207  --   --  205    BNP Recent Labs  Lab 04/07/21 1100  BNP 1,899.5*     DDimer No results for input(s): DDIMER in the last 168 hours.   Radiology    No results found.  Cardiac Studies   Echocardiogram 04/08/2021: Impressions:  1. Hypokinesis of the inferolateral wall with overall mild LV  dysfunction.   2. Left ventricular ejection fraction, by estimation, is 45 to 50%. The  left ventricle has mildly decreased function. The left ventricle  demonstrates regional wall motion abnormalities (see scoring  diagram/findings for description). There is moderate  left ventricular hypertrophy. Left ventricular diastolic parameters are  consistent with Grade I diastolic dysfunction (impaired relaxation).   3. Right ventricular systolic function is normal. The right ventricular  size is normal. There is moderately elevated pulmonary artery systolic  pressure.   4. Left atrial size was mildly dilated.   5. Right atrial size was mildly dilated.   6. The mitral valve is normal in  structure. Mild mitral valve  regurgitation. No evidence of mitral stenosis.   7. The aortic valve is normal in structure. Aortic valve regurgitation is  moderate. Mild to moderate aortic valve stenosis.   8. The inferior vena cava is dilated in size with >50% respiratory  variability, suggesting right atrial pressure of 8 mmHg.    Patient Profile     70 y.o. 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 Cardiology is consulted for acute on chronic heart failure exacerbation.  Assessment & Plan    #Acute on Chronic Hypoxic Respiratory Failure #COPD with Pulmonary Hypertension on Home O2 Patient presented with shortness of breath and cough x1 week after running out of his Trelegy inhaler. O2 sats initially in the 70s on room air. Improved with NRB. Suspect acute decompensation secondary to COPD exacerbation in combination with acute on chronic systolic HF exacerbation - Management of COPD per primary - Manage HFrEF as below   #Acute on Chronic Combined CHF #Non-Ischemic Cardiomyopathy: Likely precipitated by hypoxia and Afib with RVR. BNP markedly elevated at 1,899. CXR with some vascular congestion but no overt edema. TTE with LVEF of 45-50% with hypokinesis of the inferolateral wall,  grade 1 diastolic dysfunction, and moderately elevated PASP. EF down from 65-70% in 11/2019 and wall  motion abnormality is new. - Continue lasix '40mg'$  IV BID - No ACEi/ARB/ARNI or MRA due to renal function. - Continue Coreg 6.'25mg'$  twice daily. - Continue imdur '30mg'$  daily - Continue hydralazine '25mg'$  TID; up-titrate as tolerated - Potentially add SGLT2i tomorrow pending GFR - Continue to monitor daily weights, strict I/Os, and renal function. - Patient would benefit from repeat ischemic evaluation at some point with reduced EF and new wall motion abnormality; consider as out-patient once recovered from acute illness   #Paroxysmal Atrial Fibrillation CHADs-vasc 5. Has known history of Afib on apixaban for AC. Found to be in flutter on admission with RVR in the setting of hypoxia as detailed above. Converted to NSR spontaneously in the ER. - Continue Coreg 6.'25mg'$  twice daily. - Continue apixaban '5mg'$  BID - TSH normal   #Non-Sustained VT #Frequent PVCs, NSVT 24 beat run in the ER in the setting of hypoxia. Lytes normal - Continue Coreg 6.'25mg'$  twice daily - Keep K>4, Mg>2 - Continue to monitor on telemetry   #Elevated Troponin: Likely demand in the setting of acute on chronic hypoxic respiratory failure and Aflutter with RVR. TTE, however, did show interval decrease in LVEF to 45-50% from 65-70% with inferolateral WMA. No current anginal symptoms. Will likely need ischemic work-up once more clinically stable and renal function returns to baseline. Not a good candidate for lexiscan due to acute COPD exacerbation. -Will need ischemic work-up; can be done as out-patient once recovered from acute illness -Not a good candidate for lexiscan due to acute COPD exacerbation  -Avoiding contrast for now given AKI on CKD   #Hypertension Improved. 120s this AM. - Continue Coreg 6.'25mg'$  twice daily. - Continue imdur '30mg'$  daily - Continue hydralazine '25mg'$  TID; will up-titrate as tolerated    #Hyperlipidemia LDL much better controlled and at goal at <70. -Continue crestor '10mg'$  daily   #Type 2 Diabetes  - Hemoglobin A1c 7.0 this admission. - Management per primary team.   #PAD Patient with significant history of severe PAD s/p multiple interventions. Recent lower extremity dopplers in 02/2021 showed left ABI of 0.49 and right ABI of 0.80 and ultrasound was concerning for >70% stenosis of the distal anastomosis in the mid calf area. [name] recommended peripheral angiography to further define this area but patient wished to discuss this with wife. - Follow-up with Vascular Surgery as outpatient - Continue crestor '10mg'$  daily - Not on ASA '81mg'$  daily given need for apixaban   #Acute on CKD Stage III Significantly improved.  - Creatinine 2.29 on admission, improved to 1.68, now 2.02 - Plan switch to oral lasix 40 mg daily today  OK TO D/C TODAY FROM A CARDIOLOGY STANDPOINT - will need early recheck of BMET next week due to rising creatinine, but suspect it was a degree of overdiuresis. Follow-up with Dr. Martinique.     For questions or updates, please contact [place] Please consult [email or web address] for contact info under   Pixie Casino, MD, FACC, [place] Director of the [facility] Diplomate of the American Board of Clinical Lipidology Attending Cardiologist  Direct Dial: [phone]  Fax: [phone]  Website:  [email or web address]  Pixie Casino, MD  04/12/2021, 1:58 PM

## 2021-04-12 NOTE — Plan of Care (Signed)
Health behavior/Discharge planning:Pt has medication ready for discharge and has been educated on how to  use them appropriately.

## 2021-04-12 NOTE — Progress Notes (Signed)
Family Medicine Teaching Service Daily Progress Note Intern Pager: [phone]  Patient name: Kyle [name] Medical record number: [id number] Date of birth: [birth year] Age: 70 y.o. Gender: male  Primary Care Provider: Alonna Buckler, MD Consultants: Cards Code Status: Full  Pt Overview and Major Events to Date:  04/07/2021: Admitted to FPTS  Assessment and Plan: Kyle [name] is a 69 year old male presented with dyspnea and found to have severe acute hypoxic respiratory failure.  PMH is significant for atrial fibrillation on Eliquis, HTN, HLD, CKD IIIa, HFmEF, COPD on 4.5 L O2  Acute hypoxic respiratory failure, likely due to acute on chronic HFmEF vs COPD exacerbation Back on baseline home O2.  Down 3 kg.  Lasix decreased to PO 40 mg daily.  Given improvement, possible discharge today pending Cards clearance.   - Cards following, appreciate recs - follow up on DME orders with Case Managment - Patient ToC medications delivered up to room yesterday  AKI Up to 2.02 from 1.68 yesterday.  Will likely improve after switching to PO Lasix - follow up BMP  FEN/GI: Heart healthy diet PPx: Eliquis   Status is: Inpatient   Dispo: The patient is from: Home              Anticipated d/c is to: Home              Patient currently is not medically stable to d/c.   Difficult to place patient No   Subjective:  Patient indicates breathing better and cough improving as well.  Objective: Temp:  [97.7 F (36.5 C)-98 F (36.7 C)] 98 F (36.7 C) (06/18 0530) Pulse Rate:  [86-100] 86 (06/18 0745) Resp:  [18-25] 20 (06/18 0745) BP: (140-155)/(73-102) 149/81 (06/18 0745) SpO2:  [89 %-99 %] 94 % (06/18 0837) FiO2 (%):  [60 %] 60 % (06/18 0745) Weight:  [69.9 kg] 69.9 kg (06/18 0700) Physical Exam:  Physical Exam Constitutional:      General: He is not in acute distress. HENT:     Head: Normocephalic and atraumatic.  Cardiovascular:     Rate and Rhythm: Normal rate and regular  rhythm.  Pulmonary:     Effort: Pulmonary effort is normal.     Breath sounds: Normal breath sounds. No decreased breath sounds, wheezing or rales.  Musculoskeletal:     Right lower leg: No edema.     Left lower leg: No edema.  Skin:    General: Skin is warm.  Neurological:     General: No focal deficit present.     Mental Status: He is alert.  Psychiatric:        Mood and Affect: Mood normal.        Behavior: Behavior normal.     Laboratory: Recent Labs  Lab 04/07/21 1100 04/07/21 1108 04/07/21 2149 04/08/21 0500  WBC 13.7*  --   --  15.6*  HGB 14.9 16.3 15.3 14.4  HCT 47.3 48.0 45.0 45.5  PLT 207  --   --  205   Recent Labs  Lab 04/07/21 1100 04/07/21 1108 04/10/21 0538 04/11/21 0213 04/12/21 0408  NA 138   < > 137 137 138  K 4.3   < > 4.3 4.3 4.2  CL 105   < > 101 101 100  CO2 22   < > '27 25 28  '$ BUN 27*   < > 40* 42* 51*  CREATININE 2.29*   < > 1.90* 1.68* 2.02*  CALCIUM 8.6*   < > 8.2*  8.9 8.9  PROT 6.1*  --   --   --   --   BILITOT 1.0  --   --   --   --   ALKPHOS 87  --   --   --   --   ALT 41  --   --   --   --   AST 32  --   --   --   --   GLUCOSE 225*   < > 105* 110* 264*   < > = values in this interval not displayed.    Imaging/Diagnostic Tests: No new imaging  Delora Fuel, MD 04/12/2021, 12:14 PM PGY-1, [place] Intern pager: [phone], text pages welcome

## 2021-04-12 NOTE — Progress Notes (Signed)
The patient will be discharged with medications from [place]. Unable to reach [place] to provide documentation brought in by wife. Patient has all prescriptions at the bedside and has no further questions. Also, 6-1 bedside commode was ordered for the patient; however, the wife and patient refused equipment. The wife stated, "there is no room in their apartment and he gets around fine on his own." The patient was educated on safety and assistive devices.

## 2021-04-14 ENCOUNTER — Other Ambulatory Visit (HOSPITAL_COMMUNITY): Payer: Self-pay

## 2021-04-14 NOTE — Discharge Summary (Signed)
[facility] Discharge Summary  Patient name: Kyle [name] Medical record number: [id number] Date of birth: 1951-02-04 Age: 70 y.o. Gender: male Date of Admission: 04/07/2021  Date of Discharge: 04/13/19 Admitting Physician: Kinnie Feil, MD  Primary Care Provider: Alonna Buckler, MD Consultants: Cards  Indication for Hospitalization: Sortness of breath  Discharge Diagnoses/Problem List:  Atrial fibrillation on Eliquis, HTN, HLD, CKD IIIa, HFmEF, COPD on 4.5 L O2  Disposition: Stable  Discharge Condition: Able to be discharged home safely  Discharge Exam:  Temp:  [98.1 F (36.7 C)-98.7 F (37.1 C)] 98.7 F (37.1 C) (06/16 0519) Pulse Rate:  [75-95] 95 (06/16 0830) Resp:  [19-32] 29 (06/16 0830) BP: (135-159)/(67-85) 144/78 (06/16 0800) SpO2:  [90 %-100 %] 97 % (06/16 0832) FiO2 (%):  [60 %-80 %] 60 % (06/16 0829) Physical Exam:   Physical Exam Constitutional:      General: He is not in acute distress. HENT:    Head: Normocephalic and atraumatic.    Mouth/Throat:    Mouth: Mucous membranes are moist. Cardiovascular:    Rate and Rhythm: Normal rate and regular rhythm. Pulmonary:    Breath sounds: Wheezing present. No rales.    Comments: Scattered wheezes throughout Skin:    General: Skin is warm. Neurological:    General: No focal deficit present.    Mental Status: He is alert.   Brief Hospital Course:  Kyle [name] is a 70 y.o. male presenting with dyspnea. PMH is significant for atrial fibrillation anticoagulant Eliquis, HTN, HLD, CKD, CHF, COPD on 4-1/2 L nasal cannula.    Acute hypoxemic respiratory failure  CHF exacerbation Patient was admitted for increasing shortness of breath in the setting of medication noncompliance.  In the ED, patient was requiring increased oxygen from baseline, admitted on 15 L nonrebreather was able to be weaned somewhat.  BNP was 1899, troponins were elevated but flat, EKG initially showed  atrial flutter but was NSR on repeat.  CXR showed pulmonary edema, DVT ultrasounds were negative.  Patient received 1 dose of IV Lasix with initial improvement and then had increased oxygen requirement, repeat CXR showed worsening edema.  Patient was given IV Lasix 40 mg.  Echocardiogram showed Hypokinesis of the inferolateral wall with overall mild LV  dysfunction.  Cardiology was consulted.   Aggressively diuresed with IV lasix.  Added on Imdur 15 and Hydralazine for tighter blood pressure control. Transitioned to PO Lasix by time of discharge.    Concern for COPD exacerbation Patient with history of COPD with baseline oxygen requirement of 4.5 L was requiring increased oxygen in the setting of not having Trelegy.  Patient has required nonrebreather, was given duo nebs with some improvement initially.  Patient was started on prednisone and azithromycin, completed 3 day course of azithromycin and 5 day course of Prednisone.  Repeat chest X-Ray showed improvement. Back to baseline O2 requirement by time of discharge and discharged with home medications.   AKI on CKD stage IIIa Creatinine was elevated from baseline on admission.  Noted to previously require hemodialysis for an AKI.  Fluids were held initially given fluid overload.    Other chronic conditions were stable during hospitalization  Issues for follow-up: Follow up w/ Neurology for the need for Keppra Follow up with Cardiology Dr. Martinique Repeat BMP to monitor creatinine   Significant Procedures: ECHO  Significant Labs and Imaging:  No results for input(s): WBC, HGB, HCT, PLT in the last 168 hours.  Recent Labs  Lab 04/09/21 0317 04/09/21 0725 04/10/21 0538 04/11/21 0213 04/12/21 0408  NA 137  --  137 137 138  K 5.4*   < > 4.3 4.3 4.2  CL 105  --  101 101 100  CO2 16*  --  '27 25 28  '$ GLUCOSE 151*  --  105* 110* 264*  BUN 45*  --  40* 42* 51*  CREATININE 1.93*  --  1.90* 1.68* 2.02*  CALCIUM 8.4*  --  8.2* 8.9 8.9  PHOS   --   --   --   --  3.7  ALBUMIN  --   --   --   --  2.2*   < > = values in this interval not displayed.    EXAM: PORTABLE CHEST 1 VIEW   COMPARISON:  04/07/2021   FINDINGS: Shallow inspiration. Cardiac enlargement with mild perihilar infiltration. This could indicate edema or pneumonia. No pleural effusions. No pneumothorax. Similar appearance to previous study.   IMPRESSION: Cardiac enlargement with bilateral pulmonary perihilar infiltrates.     Electronically Signed   By: Lucienne Capers M.D.   On: 04/10/2021 00:22   ECHOCARDIOGRAM REPORT         Patient Name:   Kyle [name] Date of Exam: 04/08/2021  Medical Rec #:  [id number]      Height:       70.0 in  Accession #:    [id number]     Weight:       160.1 lb  Date of Birth:  01-Jul-1951      BSA:          1.899 m  Patient Age:    64 years       BP:           148/64 mmHg  Patient Gender: M              HR:           62 bpm.  Exam Location:  Inpatient   Procedure: 2D Echo, Color Doppler and Cardiac Doppler   STAT ECHO   Indications:    I50.9* Heart failure (unspecified)     History:        Patient has prior history of Echocardiogram examinations,  most                  recent 12/13/2019. CHF, COPD, Arrythmias:Atrial  Fibrillation;                  Risk Factors:Diabetes, Hypertension and Dyslipidemia.     Sonographer:    Raquel Sarna Senior RDCS  Referring Phys: [phone] CARINA M BROWN   IMPRESSIONS     1. Hypokinesis of the inferolateral wall with overall mild LV  dysfunction.   2. Left ventricular ejection fraction, by estimation, is 45 to 50%. The  left ventricle has mildly decreased function. The left ventricle  demonstrates regional wall motion abnormalities (see scoring  diagram/findings for description). There is moderate  left ventricular hypertrophy. Left ventricular diastolic parameters are  consistent with Grade I diastolic dysfunction (impaired relaxation).   3. Right ventricular systolic function is  normal. The right ventricular  size is normal. There is moderately elevated pulmonary artery systolic  pressure.   4. Left atrial size was mildly dilated.   5. Right atrial size was mildly dilated.   6. The mitral valve is normal in structure. Mild mitral valve  regurgitation. No evidence of mitral stenosis.   7. The aortic valve is normal in  structure. Aortic valve regurgitation is  moderate. Mild to moderate aortic valve stenosis.   8. The inferior vena cava is dilated in size with >50% respiratory  variability, suggesting right atrial pressure of 8 mmHg.   FINDINGS   Left Ventricle: Left ventricular ejection fraction, by estimation, is 45  to 50%. The left ventricle has mildly decreased function. The left  ventricle demonstrates regional wall motion abnormalities. The left  ventricular internal cavity size was normal  in size. There is moderate left ventricular hypertrophy. Left ventricular  diastolic parameters are consistent with Grade I diastolic dysfunction  (impaired relaxation).   Right Ventricle: The right ventricular size is normal. Right ventricular  systolic function is normal. There is moderately elevated pulmonary artery  systolic pressure. The tricuspid regurgitant velocity is 3.16 m/s, and  with an assumed right atrial  pressure of 15 mmHg, the estimated right ventricular systolic pressure is  123456 mmHg.   Left Atrium: Left atrial size was mildly dilated.   Right Atrium: Right atrial size was mildly dilated.   Pericardium: There is no evidence of pericardial effusion.   Mitral Valve: The mitral valve is normal in structure. Mild mitral annular  calcification. Mild mitral valve regurgitation. No evidence of mitral  valve stenosis.   Tricuspid Valve: The tricuspid valve is normal in structure. Tricuspid  valve regurgitation is mild . No evidence of tricuspid stenosis.   Aortic Valve: The aortic valve is normal in structure. Aortic valve  regurgitation is  moderate. Aortic regurgitation PHT measures 595 msec.  Mild to moderate aortic stenosis is present. Aortic valve mean gradient  measures 15.0 mmHg. Aortic valve peak  gradient measures 27.7 mmHg. Aortic valve area, by VTI measures 1.33 cm.   Pulmonic Valve: The pulmonic valve was not well visualized. Pulmonic valve  regurgitation is not visualized. No evidence of pulmonic stenosis.   Aorta: The aortic root is normal in size and structure.   Venous: The inferior vena cava is dilated in size with greater than 50%  respiratory variability, suggesting right atrial pressure of 8 mmHg.   IAS/Shunts: No atrial level shunt detected by color flow Doppler.   Additional Comments: Hypokinesis of the inferolateral wall with overall  mild LV dysfunction.      LEFT VENTRICLE  PLAX 2D  LVIDd:         4.60 cm  LVIDs:         3.10 cm  LV PW:         1.20 cm  LV IVS:        1.40 cm  LVOT diam:     2.10 cm  LV SV:         78  LV SV Index:   41  LVOT Area:     3.46 cm      RIGHT VENTRICLE  RV S prime:     12.50 cm/s  TAPSE (M-mode): 1.8 cm   LEFT ATRIUM             Index       RIGHT ATRIUM           Index  LA diam:        3.60 cm 1.90 cm/m  RA Area:     24.20 cm  LA Vol (A2C):   57.5 ml 30.28 ml/m RA Volume:   77.90 ml  41.03 ml/m  LA Vol (A4C):   32.9 ml 17.33 ml/m  LA Biplane Vol: 44.3 ml 23.33 ml/m   AORTIC VALVE  AV Area (Vmax):    1.17 cm  AV Area (Vmean):   1.31 cm  AV Area (VTI):     1.33 cm  AV Vmax:           263.00 cm/s  AV Vmean:          185.000 cm/s  AV VTI:            0.582 m  AV Peak Grad:      27.7 mmHg  AV Mean Grad:      15.0 mmHg  LVOT Vmax:         89.15 cm/s  LVOT Vmean:        69.900 cm/s  LVOT VTI:          0.224 m  LVOT/AV VTI ratio: 0.38  AI PHT:            595 msec     AORTA  Ao Root diam: 2.90 cm   TRICUSPID VALVE  TR Peak grad:   39.9 mmHg  TR Vmax:        316.00 cm/s     SHUNTS  Systemic VTI:  0.22 m  Systemic Diam: 2.10 cm   Kirk Ruths MD  Electronically signed by Kirk Ruths MD  Signature Date/Time: 04/08/2021/9:16:49 AM         Final        Results/Tests Pending at Time of Discharge: None  Discharge Medications:  Allergies as of 04/12/2021   No Known Allergies      Medication List     STOP taking these medications    albuterol (2.5 MG/3ML) 0.083% nebulizer solution Commonly known as: PROVENTIL Replaced by: albuterol 108 (90 Base) MCG/ACT inhaler   amLODipine 5 MG tablet Commonly known as: NORVASC       TAKE these medications    Accu-Chek FastClix Lancets Misc   Accu-Chek Guide test strip Generic drug: glucose blood one strip (1 each dose) by Other route 2 (two) times daily. Test blood glucose 2 times a day.   Adjustable Commode 3-in-1 Misc Use daily   albuterol 108 (90 Base) MCG/ACT inhaler Commonly known as: VENTOLIN HFA Inhale 2 puffs into the lungs every 6 (six) hours as needed for wheezing or shortness of breath. Replaces: albuterol (2.5 MG/3ML) 0.083% nebulizer solution   budesonide 0.25 MG/2ML nebulizer solution Commonly known as: PULMICORT Take 2 mLs (0.25 mg total) by nebulization 2 (two) times daily. Start this AFTER you are done with the inhalers you have   carvedilol 6.25 MG tablet Commonly known as: COREG Take 1 tablet (6.25 mg total) by mouth 2 (two) times daily with a meal.   Centrum Silver 50+Men Tabs Take 1 tablet by mouth daily with breakfast.   Eliquis 5 MG Tabs tablet Generic drug: apixaban Take 1 tablet (5 mg total) by mouth 2 (two) times daily.   furosemide 40 MG tablet Commonly known as: LASIX Take 1 tablet (40 mg total) by mouth daily. What changed:  medication strength how much to take how to take this when to take this additional instructions   hydrALAZINE 25 MG tablet Commonly known as: APRESOLINE Take 1 tablet (25 mg total) by mouth every 8 (eight) hours.   insulin detemir 100 unit/ml Soln Commonly known as: LEVEMIR Inject 28  Units into the skin in the morning.   Insulin Pen Needle 32G X 4 MM Misc Use as directed with insulin pen   ipratropium 0.02 % nebulizer solution Commonly known as: ATROVENT Take 2.5  mLs (0.5 mg total) by nebulization in the morning, at noon, in the evening, and at bedtime. Start this AFTER you are done with the inhalers you have.  Inhale via nebulizer in the morning and in the evening every day. May use an additional two times a day if needed for shortness of breath.   isosorbide mononitrate 30 MG 24 hr tablet Commonly known as: IMDUR Take 1 tablet (30 mg total) by mouth daily.   levETIRAcetam 500 MG tablet Commonly known as: KEPPRA Take 500 mg by mouth in the morning.   OXYGEN Inhale 4.5 L/min into the lungs continuous.   Trelegy Ellipta 100-62.5-25 MCG/INH Aepb Generic drug: Fluticasone-Umeclidin-Vilant Inhale 1 puff into the lungs daily.       ASK your doctor about these medications    guaiFENesin 600 MG 12 hr tablet Commonly known as: MUCINEX Take 2 tablets (1,200 mg total) by mouth 2 (two) times daily.   insulin aspart 100 UNIT/ML FlexPen Commonly known as: NOVOLOG Inject 3 Units into the skin 3 (three) times daily with meals.   ketoconazole 2 % cream Commonly known as: NIZORAL Apply to both feet and between toes once daily for 6 weeks.   rosuvastatin 10 MG tablet Commonly known as: CRESTOR Take 1 tablet (10 mg total) by mouth daily at 6 PM.        Discharge Instructions: Please refer to Patient Instructions section of EMR for full details.  Patient was counseled important signs and symptoms that should prompt return to medical care, changes in medications, dietary instructions, activity restrictions, and follow up appointments.   Follow-Up Appointments:   Delora Fuel, MD 04/15/2021, 7:18 AM PGY-1, [place]

## 2021-04-16 ENCOUNTER — Encounter (HOSPITAL_COMMUNITY): Payer: Medicare HMO

## 2021-04-22 ENCOUNTER — Telehealth (HOSPITAL_COMMUNITY): Payer: Self-pay | Admitting: Vascular Surgery

## 2021-04-22 NOTE — Telephone Encounter (Signed)
Returned pt wife's call to resch 6/28 appt / TOC. PT has been reach 7/11 @ 11

## 2021-04-23 ENCOUNTER — Encounter (HOSPITAL_COMMUNITY): Payer: Medicare HMO

## 2021-04-24 ENCOUNTER — Telehealth: Payer: Self-pay

## 2021-04-24 ENCOUNTER — Telehealth: Payer: Self-pay | Admitting: Acute Care

## 2021-04-24 NOTE — Telephone Encounter (Signed)
I have left a message for the wife to call me back [phone]

## 2021-04-24 NOTE — Telephone Encounter (Signed)
Spoke with pt's wife about patient assistance applications for trelegy, levemir and novolog. Apps were previously submitted by hospital pharmacy team but were missing a few signatures. Pt requested to have pages mailed to their home and will return them to office ([place])

## 2021-04-24 NOTE — Telephone Encounter (Signed)
Patient's wife calls today to cancel patient's procedure for tomorrow. He is still not feeling well. She wants him to get a little stronger prior to rescheduling. She is hopeful it will be in the next few weeks. Advised her I would take him off the schedule and we would wait for her call back.

## 2021-04-24 NOTE — Telephone Encounter (Signed)
Will forward to [facility]) to contact pt to schedule CT.

## 2021-04-25 ENCOUNTER — Encounter (HOSPITAL_COMMUNITY): Admission: RE | Payer: Self-pay | Source: Home / Self Care

## 2021-04-25 ENCOUNTER — Ambulatory Visit (HOSPITAL_COMMUNITY): Admission: RE | Admit: 2021-04-25 | Payer: Medicare HMO | Source: Home / Self Care | Admitting: Vascular Surgery

## 2021-04-25 SURGERY — ABDOMINAL AORTOGRAM W/LOWER EXTREMITY
Anesthesia: LOCAL

## 2021-04-29 ENCOUNTER — Telehealth (HOSPITAL_COMMUNITY): Payer: Self-pay | Admitting: Pharmacist

## 2021-04-29 NOTE — Telephone Encounter (Signed)
Unable to reach patient will attempt at a later date.

## 2021-04-30 ENCOUNTER — Telehealth (HOSPITAL_COMMUNITY): Payer: Self-pay | Admitting: Pharmacist

## 2021-04-30 NOTE — Telephone Encounter (Signed)
Unable to reach patient, third and final attempt.

## 2021-05-01 NOTE — Telephone Encounter (Signed)
I have spoke with Lucita Ferrara the wife and Mr. Magel's LCS CT has been rescheduled for 05/08/21 @ 2:00pm

## 2021-05-02 ENCOUNTER — Encounter: Payer: Self-pay | Admitting: Family Medicine

## 2021-05-02 ENCOUNTER — Ambulatory Visit (INDEPENDENT_AMBULATORY_CARE_PROVIDER_SITE_OTHER): Payer: Medicare HMO | Admitting: Family Medicine

## 2021-05-02 ENCOUNTER — Other Ambulatory Visit: Payer: Self-pay

## 2021-05-02 ENCOUNTER — Ambulatory Visit (INDEPENDENT_AMBULATORY_CARE_PROVIDER_SITE_OTHER): Payer: Medicare HMO

## 2021-05-02 VITALS — BP 142/84 | HR 88 | Ht 70.0 in | Wt 162.0 lb

## 2021-05-02 DIAGNOSIS — I5022 Chronic systolic (congestive) heart failure: Secondary | ICD-10-CM

## 2021-05-02 DIAGNOSIS — R569 Unspecified convulsions: Secondary | ICD-10-CM | POA: Diagnosis not present

## 2021-05-02 DIAGNOSIS — Z23 Encounter for immunization: Secondary | ICD-10-CM

## 2021-05-02 DIAGNOSIS — N183 Chronic kidney disease, stage 3 unspecified: Secondary | ICD-10-CM

## 2021-05-02 DIAGNOSIS — G40909 Epilepsy, unspecified, not intractable, without status epilepticus: Secondary | ICD-10-CM | POA: Diagnosis not present

## 2021-05-02 DIAGNOSIS — E1122 Type 2 diabetes mellitus with diabetic chronic kidney disease: Secondary | ICD-10-CM | POA: Diagnosis not present

## 2021-05-02 DIAGNOSIS — N184 Chronic kidney disease, stage 4 (severe): Secondary | ICD-10-CM

## 2021-05-02 NOTE — Progress Notes (Signed)
   SUBJECTIVE:   CHIEF COMPLAINT / HPI:    Kyle [name] is a 70 y.o. male here for hospital follow up and to establish care with [facility]. His former PCP retired.  Pt hospitalized for COPD and CHF exacerbations.  Reports breathing well without difficulty. Swelling in left foot started a few days ago.  Pt reports fasting CBGs 109-87. His wife manages his medications. Denies chest discomfort or shortness of breath.    PERTINENT  PMH / PSH: reviewed and updated as appropriate   OBJECTIVE:   BP (!) 142/84   Pulse 88   Ht '5\' 10"'$  (1.778 m)   Wt 162 lb (73.5 kg)   SpO2 96%   BMI 23.24 kg/m    GEN: well appearing male in no acute distress  CV: irregularly irregular rhythm, normal rate  RESP: no increased work of breathing, clear to ascultation bilaterally with no crackles, wheezes, or rhonchi  MSK: 1+ non-pitting pedal edema, no calf tenderness  SKIN: warm, dry    ASSESSMENT/PLAN:   Chronic systolic CHF (congestive heart failure) (HCC) Continue Torsemide 40 mg daily. Dry weight appears to be around 162-163 lbs. Pt advised to take additional torsemide when >3lbs over his dry weight. Has scheduled cardiology follow up with Dr. Martinique.   CKD (chronic kidney disease) stage 4, GFR 15-29 ml/min (HCC) BMP today. Renal dosed medications   Patient previously prescribed Keppra. Wife reports he is taking this medication. Referral for neurology placed to better elucidate need for Keppra.   Lyndee Hensen, DO PGY-2, [facility] 05/02/2021

## 2021-05-02 NOTE — Patient Instructions (Signed)
It was great seeing you today!  Please check-out at the front desk before leaving the clinic. I'd like to see you back in 3 months but if you need to be seen earlier than that for any new issues we're happy to fit you in, just give [place] a call!  Visit Remembers: -  When you start you last inhaler let me know so I can get the nebulizer sent to you  - Continue to work on your healthy eating habits and incorporating exercise into your daily life.  - Your goal is to have an BP < 135/85 - Medicine Changes: Take an extra dose of Lasix (40 mg ) today  - To Do: Go to cardiology appointment.  - You were referred neurology to discuss [place]    Regarding lab work today:  Due to recent changes in healthcare laws, you may see the results of your imaging and laboratory studies on MyChart before your provider has had a chance to review them.  I understand that in some cases there may be results that are confusing or concerning to you. Not all laboratory results come back in the same time frame and you may be waiting for multiple results in order to interpret others.  Please give [place] 72 hours in order for your provider to thoroughly review all the results before contacting the office for clarification of your results. If everything is normal, you will get a letter in the mail or a message in My Chart. Please give [place] a call if you do not hear from [place] after 2 weeks.  Please bring all of your medications with you to each visit.    If you haven't already, sign up for My Chart to have easy access to your labs results, and communication with your primary care physician.  Feel free to call with any questions or concerns at any time, at [phone].   Take care,  Dr. Rushie [facility]

## 2021-05-05 ENCOUNTER — Encounter (HOSPITAL_COMMUNITY): Payer: Medicare HMO

## 2021-05-05 NOTE — Assessment & Plan Note (Signed)
Continue Torsemide 40 mg daily. Dry weight appears to be around 162-163 lbs. Pt advised to take additional torsemide when >3lbs over his dry weight. Has scheduled cardiology follow up with Dr. Martinique.

## 2021-05-05 NOTE — Assessment & Plan Note (Addendum)
BMP today. Renal dosed medications

## 2021-05-06 ENCOUNTER — Other Ambulatory Visit: Payer: Self-pay

## 2021-05-06 ENCOUNTER — Emergency Department (HOSPITAL_BASED_OUTPATIENT_CLINIC_OR_DEPARTMENT_OTHER)
Admission: EM | Admit: 2021-05-06 | Discharge: 2021-05-06 | Disposition: A | Discharge status: Home / Self Care | Payer: Medicare HMO | Attending: Emergency Medicine | Admitting: Emergency Medicine

## 2021-05-06 ENCOUNTER — Encounter (HOSPITAL_BASED_OUTPATIENT_CLINIC_OR_DEPARTMENT_OTHER): Payer: Self-pay

## 2021-05-06 DIAGNOSIS — I130 Hypertensive heart and chronic kidney disease with heart failure and stage 1 through stage 4 chronic kidney disease, or unspecified chronic kidney disease: Secondary | ICD-10-CM | POA: Diagnosis not present

## 2021-05-06 DIAGNOSIS — I5023 Acute on chronic systolic (congestive) heart failure: Secondary | ICD-10-CM | POA: Diagnosis not present

## 2021-05-06 DIAGNOSIS — E11649 Type 2 diabetes mellitus with hypoglycemia without coma: Secondary | ICD-10-CM | POA: Insufficient documentation

## 2021-05-06 DIAGNOSIS — E162 Hypoglycemia, unspecified: Secondary | ICD-10-CM | POA: Diagnosis present

## 2021-05-06 DIAGNOSIS — Z79899 Other long term (current) drug therapy: Secondary | ICD-10-CM | POA: Diagnosis not present

## 2021-05-06 DIAGNOSIS — J449 Chronic obstructive pulmonary disease, unspecified: Secondary | ICD-10-CM | POA: Diagnosis not present

## 2021-05-06 DIAGNOSIS — N184 Chronic kidney disease, stage 4 (severe): Secondary | ICD-10-CM | POA: Diagnosis not present

## 2021-05-06 DIAGNOSIS — E1122 Type 2 diabetes mellitus with diabetic chronic kidney disease: Secondary | ICD-10-CM | POA: Diagnosis not present

## 2021-05-06 DIAGNOSIS — Z87891 Personal history of nicotine dependence: Secondary | ICD-10-CM | POA: Diagnosis not present

## 2021-05-06 DIAGNOSIS — I4892 Unspecified atrial flutter: Secondary | ICD-10-CM | POA: Diagnosis not present

## 2021-05-06 DIAGNOSIS — Z794 Long term (current) use of insulin: Secondary | ICD-10-CM | POA: Diagnosis not present

## 2021-05-06 DIAGNOSIS — Z7901 Long term (current) use of anticoagulants: Secondary | ICD-10-CM | POA: Insufficient documentation

## 2021-05-06 DIAGNOSIS — Z7951 Long term (current) use of inhaled steroids: Secondary | ICD-10-CM | POA: Insufficient documentation

## 2021-05-06 LAB — CBC WITH DIFFERENTIAL/PLATELET
Abs Immature Granulocytes: 0.04 10*3/uL (ref 0.00–0.07)
Basophils Absolute: 0 10*3/uL (ref 0.0–0.1)
Basophils Relative: 1 %
Eosinophils Absolute: 0.4 10*3/uL (ref 0.0–0.5)
Eosinophils Relative: 5 %
HCT: 52.1 % — ABNORMAL HIGH (ref 39.0–52.0)
Hemoglobin: 16.3 g/dL (ref 13.0–17.0)
Immature Granulocytes: 1 %
Lymphocytes Relative: 18 %
Lymphs Abs: 1.3 10*3/uL (ref 0.7–4.0)
MCH: 29.6 pg (ref 26.0–34.0)
MCHC: 31.3 g/dL (ref 30.0–36.0)
MCV: 94.7 fL (ref 80.0–100.0)
Monocytes Absolute: 0.6 10*3/uL (ref 0.1–1.0)
Monocytes Relative: 8 %
Neutro Abs: 5.2 10*3/uL (ref 1.7–7.7)
Neutrophils Relative %: 67 %
Platelets: 196 10*3/uL (ref 150–400)
RBC: 5.5 MIL/uL (ref 4.22–5.81)
RDW: 18 % — ABNORMAL HIGH (ref 11.5–15.5)
WBC: 7.6 10*3/uL (ref 4.0–10.5)
nRBC: 0 % (ref 0.0–0.2)

## 2021-05-06 LAB — BASIC METABOLIC PANEL
Anion gap: 11 (ref 5–15)
BUN/Creatinine Ratio: 14 (ref 10–24)
BUN: 22 mg/dL (ref 8–27)
BUN: 24 mg/dL — ABNORMAL HIGH (ref 8–23)
CO2: 22 mmol/L (ref 20–29)
CO2: 33 mmol/L — ABNORMAL HIGH (ref 22–32)
Calcium: 9.1 mg/dL (ref 8.6–10.2)
Calcium: 9.3 mg/dL (ref 8.9–10.3)
Chloride: 106 mmol/L (ref 96–106)
Chloride: 101 mmol/L (ref 98–111)
Creatinine, Ser: 1.57 mg/dL — ABNORMAL HIGH (ref 0.76–1.27)
Creatinine, Ser: 1.83 mg/dL — ABNORMAL HIGH (ref 0.61–1.24)
GFR, Estimated: 39 mL/min — ABNORMAL LOW (ref >60)
Glucose, Bld: 52 mg/dL — ABNORMAL LOW (ref 70–99)
Potassium: 3.6 mmol/L (ref 3.5–5.1)
Sodium: 147 mmol/L — ABNORMAL HIGH (ref 134–144)
Sodium: 145 mmol/L (ref 135–145)
eGFR: 47 mL/min/{1.73_m2} — ABNORMAL LOW (ref >59)

## 2021-05-06 LAB — CBG MONITORING, ED
Glucose-Capillary: 56 mg/dL — ABNORMAL LOW (ref 70–99)
Glucose-Capillary: 142 mg/dL — ABNORMAL HIGH (ref 70–99)

## 2021-05-06 NOTE — Progress Notes (Signed)
Submitted application for NOVOLOG to [place] for patient assistance.  Submitted application for LEVEMIR to [place] for patient assistance.   Phone: [phone]  Submitted application for TRELEGY to [place] for patient assistance.   Phone: [phone]

## 2021-05-06 NOTE — Discharge Instructions (Addendum)
Make sure that that you have a good meal when you get home.  Do not take your evening insulin

## 2021-05-06 NOTE — ED Provider Notes (Signed)
[facility] DEPT Provider Note   CSN: [id number] Arrival date & time: 05/06/21  1922     History Chief Complaint  Patient presents with   Hypoglycemia    TYMAR PULGARIN is a 70 y.o. male.  70 year old male who was found unresponsive by his wife this evening.  Patient's blood sugar was 37.  Given D10 and had good response.  Patient does have history of diabetes and states that he did not have labs today but did take his insulin.  Denies any recent illnesses.      Past Medical History:  Diagnosis Date   Aortic insufficiency    MODERATE WITH A BICUSPID AORTIC VAVLE   Arterial occlusive disease    MULTILEVEL   CHF (congestive heart failure) (HCC)    COPD (chronic obstructive pulmonary disease) ([place])    Dilated cardiomyopathy ([place])    WITH EJECTION FRACTION DOWN TO 20-25%--WITH CONGESTIVE HEART FAILURE   Edema    LOWER EXTREMETIES   Hyperglycemia 01/2020   Hypertension    Normal coronary arteries 2009   Orthopnea    Peripheral arterial disease ([place])    Pulmonary hypertension ([place])     Patient Active Problem List   Diagnosis Date Noted   Encounter for long-term (current) insulin use ([place])    Acute on chronic systolic heart failure ([place]) 04/10/2021   Atrial flutter ([place])    Noncompliance 04/16/2020   Hyperosmolar syndrome 02/20/2020   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[place]) 02/20/2020   CKD (chronic kidney disease) stage 4, GFR 15-29 ml/min (HCC) 02/19/2020   Abnormal findings on diagnostic imaging of lung 01/29/2020   Anticoagulant long-term use 01/29/2020   Heart failure ([place]) 01/20/2020   SOB (shortness of breath)    Uremia 12/24/2019   Atrial fibrillation, chronic ([place]) 12/24/2019   Diabetes mellitus type 2, insulin dependent ([place]) 12/24/2019   Hyperkalemia, diminished renal excretion 12/24/2019   Healthcare maintenance 07/14/2019   Medication management 07/14/2019   Surgical wound dehiscence, initial encounter 05/11/2019   PAD  (peripheral artery disease) ([place]) 04/22/2019   Atherosclerosis of artery of extremity with rest pain ([place]) 04/21/2019   Deformity of metatarsal bone of right foot 04/17/2019   COPD with chronic bronchitis and emphysema ([place]) 12/16/2018   Acute renal failure (HCC)    Elevated PSA 12/27/2017   Abnormal EKG 12/21/2017   Protein-calorie malnutrition, severe ([place]) 12/18/2014   Aortoiliac occlusive disease ([place]) 12/16/2014   PVD (peripheral vascular disease) ([place]) 10/24/2014   Aortic stenosis 09/12/2014   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   Pulmonary hypertension ([place]) 09/12/2014   Dyslipidemia 09/12/2014   Atherosclerosis of native arteries of extremity with intermittent claudication ([place]) 05/11/2012   Aortic insufficiency    Hypertension    Peripheral arterial disease ([place])    Dilated cardiomyopathy ([place])    Chronic systolic CHF (congestive heart failure) ([place])     Past Surgical History:  Procedure Laterality Date   ABDOMINAL AORTAGRAM N/A 11/05/2014   Procedure: ABDOMINAL Maxcine Ham;  Surgeon: Angelia Mould, MD;  Location: [facility] CATH LAB;  Service: Cardiovascular;  Laterality: N/A;   ABDOMINAL AORTOGRAM W/LOWER EXTREMITY Bilateral 04/21/2019   Procedure: ABDOMINAL AORTOGRAM W/LOWER EXTREMITY;  Surgeon: Angelia Mould, MD;  Location: [facility] CV LAB;  Service: Cardiovascular;  Laterality: Bilateral;   AORTA - BILATERAL FEMORAL ARTERY BYPASS GRAFT N/A 12/18/2014   Procedure: AORTOBIFEMORAL BYPASS GRAFT;  Surgeon: Angelia Mould, MD;  Location: [facility];  Service: Vascular;  Laterality: N/A;   CARDIAC CATHETERIZATION  2009  Nl Cors, EF 20%   COLONOSCOPY     ENDARTERECTOMY FEMORAL Left 12/18/2014   Procedure: ENDARTERECTOMY FEMORAL;  Surgeon: Angelia Mould, MD;  Location: [facility];  Service: Vascular;  Laterality: Left;   FALSE ANEURYSM REPAIR Right 04/24/2019   Procedure: REPAIR OF RIGHT FEMORAL ARTERY PSEUDOANEURYSM;  Surgeon: Rosetta Posner, MD;   [facility]: [facility] OR;  Service: Vascular;  Laterality: Right;   FEMORAL-POPLITEAL BYPASS GRAFT  02/21/2011   right   FEMORAL-TIBIAL BYPASS GRAFT Right 04/24/2019   Procedure: REDO BYPASS GRAFT RIGHT FEMORAL-TIBIAL ARTERY USING LEFT LEG VEIN & HEMASHIELD GOLD 21m GRAFT;  Surgeon: ERosetta Posner MD;  [facility]  Service: Vascular;  Laterality: Right;   IR FLUORO GUIDE CV LINE RIGHT  12/28/2019   IR REMOVAL TUN CV CATH W/O FL  01/11/2020   IR [place] VASC ACCESS RIGHT  12/28/2019   OTHER SURGICAL HISTORY     POST RIGHT FEMOROPOPLITEAL BYPASS GRAFT   PERIPHERAL VASCULAR CATHETERIZATION  11/05/2014   Procedure: LOWER EXTREMITY ANGIOGRAPHY;  Surgeon: CAngelia Mould MD;  [facility];  Service: Cardiovascular;;   VEIN HARVEST Left 04/24/2019   Procedure: LEFT LEG SAPHENOUS VEIN HARVEST;  Surgeon: ERosetta Posner MD;  Location[facility] OR;  Service: Vascular;  Laterality: Left;       Family History  Problem Relation Age of Onset   Diabetes Mother    Hyperlipidemia Sister    Hypertension Sister     Social History   Tobacco Use   Smoking status: Former    Packs/day: 1.00    Years: 40.00    Pack years: 40.00    Types: Cigarettes    Quit date: 10/15/2018    Years since quitting: 2.5   Smokeless tobacco: Never   Tobacco comments:    quit 2010 and quit again 2019  Vaping Use   Vaping Use: Never used  Substance Use Topics   Alcohol use: No    Alcohol/week: 0.0 standard drinks   Drug use: No    Home Medications Prior to Admission medications   Medication Sig Start Date End Date Taking? Authorizing Provider  ACCU-CHEK FASTCLIX LANCETS [employer] 07/30/18   [provider]  albuterol (VENTOLIN HFA) 108 (90 Base) MCG/ACT inhaler Inhale 2 puffs into the lungs every 6 (six) hours as needed for wheezing or shortness of breath. 04/11/21   Brimage, VRonnette Juniper DO  apixaban (ELIQUIS) 5 MG TABS tablet Take 1 tablet (5 mg total) by mouth 2 (two) times daily. 04/11/21   Dagar, AMeredith Staggers MD   budesonide (PULMICORT) 0.25 MG/2ML nebulizer solution Take 2 mLs (0.25 mg total) by nebulization 2 (two) times daily. Start this AFTER you are done with the inhalers you have 04/11/21   McDiarmid, TBlane Ohara MD  carvedilol (COREG) 6.25 MG tablet Take 1 tablet (6.25 mg total) by mouth 2 (two) times daily with a meal. 01/11/20 04/07/21  KAntonieta Pert MD  Fluticasone-Umeclidin-Vilant (TRELEGY ELLIPTA) 100-62.5-25 MCG/INH AEPB Inhale 1 puff into the lungs daily. 04/11/21   McDiarmid, TBlane Ohara MD  furosemide (LASIX) 40 MG tablet Take 1 tablet (40 mg total) by mouth daily. 04/12/21   Brimage, VRonnette Juniper DO  glucose blood (ACCU-CHEK GUIDE) test strip one strip (1 each dose) by Other route 2 (two) times daily. Test blood glucose 2 times a day. 11/21/19   [provider]  guaiFENesin (MUCINEX) 600 MG 12 hr tablet Take 2 tablets (1,200 mg total) by mouth 2 (two) times daily. Patient taking differently: Take  600 mg by mouth 2 (two) times daily. 11/30/18   Lavina Hamman, MD  hydrALAZINE (APRESOLINE) 25 MG tablet Take 1 tablet (25 mg total) by mouth every 8 (eight) hours. 04/11/21 05/11/21  Brimage, Ronnette Juniper, DO  insulin aspart (NOVOLOG) 100 UNIT/ML FlexPen Inject 3 Units into the skin 3 (three) times daily with meals. Patient taking differently: Inject 5 Units into the skin 3 (three) times daily with meals. 02/23/20   Allie Bossier, MD  insulin detemir (LEVEMIR) 100 unit/ml SOLN Inject 28 Units into the skin in the morning. 04/12/21   Autry-Lott, Naaman Plummer, DO  Insulin Pen Needle 32G X 4 MM MISC Use as directed with insulin pen 03/01/20   [provider]  ipratropium (ATROVENT) 0.02 % nebulizer solution Take 2.5 mLs (0.5 mg total) by nebulization in the morning, at noon, in the evening, and at bedtime. Start this AFTER you are done with the inhalers you have.  Inhale via nebulizer in the morning and in the evening every day. May use an additional two times a day if needed for shortness of breath. 04/11/21   McDiarmid,  Blane Ohara, MD  isosorbide mononitrate (IMDUR) 30 MG 24 hr tablet Take 1 tablet (30 mg total) by mouth daily. 04/12/21 05/12/21  Lyndee Hensen, DO  ketoconazole (NIZORAL) 2 % cream Apply to both feet and between toes once daily for 6 weeks. Patient taking differently: Apply 1 application topically See admin instructions. Apply to both feet and between toes once daily for 6 weeks. 03/18/21   Marzetta Board, DPM  levETIRAcetam (KEPPRA) 500 MG tablet Take 500 mg by mouth in the morning. 02/12/21   [provider]  Misc. Devices (ADJUSTABLE COMMODE 3-IN-1) MISC Use daily 04/12/21   Autry-Lott, Naaman Plummer, DO  Multiple Vitamins-Minerals (CENTRUM SILVER 50+MEN) TABS Take 1 tablet by mouth daily with breakfast.    [provider]  OXYGEN Inhale 4.5 L/min into the lungs continuous.    [provider]  rosuvastatin (CRESTOR) 10 MG tablet Take 1 tablet (10 mg total) by mouth daily at 6 PM. Patient taking differently: Take 10 mg by mouth at bedtime. 01/24/20   Hoyt Koch, MD    Allergies    Patient has no known allergies.  Review of Systems   Review of Systems  All other systems reviewed and are negative.  Physical Exam Updated Vital Signs Ht 1.778 m ('5\' 10"'$ )   Wt 73.5 kg   BMI 23.25 kg/m   Physical Exam Vitals and nursing note reviewed.  Constitutional:      General: He is not in acute distress.    Appearance: Normal appearance. He is well-developed. He is not toxic-appearing.  HENT:     Head: Normocephalic and atraumatic.  Eyes:     General: Lids are normal.     Conjunctiva/sclera: Conjunctivae normal.     Pupils: Pupils are equal, round, and reactive to light.  Neck:     Thyroid: No thyroid mass.     Trachea: No tracheal deviation.  Cardiovascular:     Rate and Rhythm: Normal rate and regular rhythm.     Heart sounds: Normal heart sounds. No murmur heard.   No gallop.  Pulmonary:     Effort: Pulmonary effort is normal. No respiratory distress.      Breath sounds: Normal breath sounds. No stridor. No decreased breath sounds, wheezing, rhonchi or rales.  Abdominal:     General: There is no distension.     Palpations: Abdomen is soft.  Tenderness: There is no abdominal tenderness. There is no rebound.  Musculoskeletal:        General: No tenderness. Normal range of motion.     Cervical back: Normal range of motion and neck supple.  Skin:    General: Skin is warm and dry.     Findings: No abrasion or rash.  Neurological:     General: No focal deficit present.     Mental Status: He is alert and oriented to person, place, and time. Mental status is at baseline.     GCS: GCS eye subscore is 4. GCS verbal subscore is 5. GCS motor subscore is 6.     Cranial Nerves: Cranial nerves are intact. No cranial nerve deficit.     Sensory: No sensory deficit.     Motor: Motor function is intact.  Psychiatric:        Attention and Perception: Attention normal.        Speech: Speech normal.        Behavior: Behavior normal.    ED Results / Procedures / Treatments   Labs (all labs ordered are listed, but only abnormal results are displayed) Labs Reviewed - No data to display  EKG None  Radiology No results found.  Procedures Procedures   Medications Ordered in ED Medications - No data to display  ED Course  I have reviewed the triage vital signs and the nursing notes.  Pertinent labs & imaging results that were available during my care of the patient were reviewed by me and considered in my medical decision making (see chart for details).    MDM Rules/Calculators/A&P                          Patient given food here and blood sugars improved.  Wife states that she will give him a meal when he gets home.  Patient is chronically on oxygen.  He denies any new complaints at this time.  Will be discharged Final Clinical Impression(s) / ED Diagnoses Final diagnoses:  None    Rx / DC Orders ED Discharge Orders     None         Lacretia Leigh, MD 05/06/21 2037

## 2021-05-06 NOTE — ED Notes (Signed)
PIV currently being attempted by Butch Penny, RN. Patient provided with Oral Intake including Apple Juice, Crackers, Peanut Butter, etc. CBG will be reassessed at a later time once Oral Intake is increased.

## 2021-05-06 NOTE — ED Notes (Signed)
Pt arrived on Room Air, SpO2 82%. Pt states he wears 5L [place] at home. Pt placed on 5L [place] at SpO2 increased to 87%. Increased to 6L [place] at this time.

## 2021-05-06 NOTE — ED Triage Notes (Signed)
Patient BIB [facility] from Home for Unresponsive and Hypoglycemia.  Patient found confused my Wife and EMS was called. Upon arrival, EMS discovered patient hypoglycemic at 73. PIV obtained and D10 given by EMS and CBG resulted at 285 (PIV lost en route).  Hx of Diabetes (Patient had recent increase in Lasix Dosage, Hypoglycemia episodes occurring more frequently).  Patient A&Ox4. GCS 15. Slightly Agitated. Wife at Bedside. VSS with EMS.

## 2021-05-08 ENCOUNTER — Inpatient Hospital Stay: Admission: RE | Admit: 2021-05-08 | Payer: Medicare HMO | Source: Ambulatory Visit

## 2021-05-09 ENCOUNTER — Other Ambulatory Visit: Payer: Self-pay

## 2021-05-09 MED ORDER — ISOSORBIDE MONONITRATE ER 30 MG PO TB24: 30.0000 mg | ORAL_TABLET | Freq: Every day | ORAL | 0 refills | Status: DC

## 2021-05-15 ENCOUNTER — Other Ambulatory Visit: Payer: Self-pay | Admitting: *Deleted

## 2021-05-15 ENCOUNTER — Inpatient Hospital Stay: Admission: RE | Admit: 2021-05-15 | Payer: Medicare HMO | Source: Ambulatory Visit

## 2021-05-16 MED ORDER — FUROSEMIDE 40 MG PO TABS: 40.0000 mg | ORAL_TABLET | Freq: Every day | ORAL | 3 refills | Status: DC

## 2021-05-21 ENCOUNTER — Other Ambulatory Visit: Payer: Self-pay

## 2021-05-21 ENCOUNTER — Ambulatory Visit (INDEPENDENT_AMBULATORY_CARE_PROVIDER_SITE_OTHER)
Admission: RE | Admit: 2021-05-21 | Discharge: 2021-05-21 | Disposition: A | Discharge status: Home / Self Care | Payer: Medicare HMO | Source: Ambulatory Visit | Attending: Acute Care | Admitting: Acute Care

## 2021-05-21 DIAGNOSIS — Z87891 Personal history of nicotine dependence: Secondary | ICD-10-CM | POA: Diagnosis not present

## 2021-05-26 ENCOUNTER — Other Ambulatory Visit: Payer: Self-pay

## 2021-05-26 MED ORDER — HYDRALAZINE HCL 25 MG PO TABS: 25.0000 mg | ORAL_TABLET | Freq: Three times a day (TID) | ORAL | 3 refills | Status: DC

## 2021-05-27 ENCOUNTER — Telehealth: Payer: Self-pay

## 2021-05-27 ENCOUNTER — Other Ambulatory Visit (HOSPITAL_COMMUNITY): Payer: Self-pay

## 2021-05-27 NOTE — Telephone Encounter (Signed)
Patient's wife calls nurse line requesting albuterol solution for breathing treatments. Wife reports being instructed that after finishing trelegy inhaler to call PCP for albuterol so patient can take breathing treatments as needed.   Please advise if medication can be sent to [facility] on [address].   Talbot Grumbling, RN

## 2021-05-30 ENCOUNTER — Telehealth: Payer: Self-pay

## 2021-05-30 ENCOUNTER — Encounter (HOSPITAL_COMMUNITY)
Admission: RE | Disposition: A | Discharge status: Home / Self Care | Payer: Self-pay | Source: Home / Self Care | Attending: Vascular Surgery

## 2021-05-30 ENCOUNTER — Encounter (HOSPITAL_COMMUNITY): Payer: Self-pay | Admitting: Vascular Surgery

## 2021-05-30 ENCOUNTER — Other Ambulatory Visit: Payer: Self-pay

## 2021-05-30 ENCOUNTER — Ambulatory Visit (HOSPITAL_COMMUNITY)
Admission: RE | Admit: 2021-05-30 | Discharge: 2021-05-30 | Disposition: A | Discharge status: Home / Self Care | Payer: Medicare HMO | Attending: Vascular Surgery | Admitting: Vascular Surgery

## 2021-05-30 DIAGNOSIS — Z7982 Long term (current) use of aspirin: Secondary | ICD-10-CM | POA: Diagnosis not present

## 2021-05-30 DIAGNOSIS — Z87891 Personal history of nicotine dependence: Secondary | ICD-10-CM | POA: Diagnosis not present

## 2021-05-30 DIAGNOSIS — I70211 Atherosclerosis of native arteries of extremities with intermittent claudication, right leg: Secondary | ICD-10-CM | POA: Insufficient documentation

## 2021-05-30 DIAGNOSIS — Z7901 Long term (current) use of anticoagulants: Secondary | ICD-10-CM | POA: Diagnosis not present

## 2021-05-30 DIAGNOSIS — Z79899 Other long term (current) drug therapy: Secondary | ICD-10-CM | POA: Diagnosis not present

## 2021-05-30 DIAGNOSIS — T82898A Other specified complication of vascular prosthetic devices, implants and grafts, initial encounter: Secondary | ICD-10-CM | POA: Diagnosis not present

## 2021-05-30 DIAGNOSIS — Z794 Long term (current) use of insulin: Secondary | ICD-10-CM | POA: Insufficient documentation

## 2021-05-30 HISTORY — PX: ABDOMINAL AORTOGRAM W/LOWER EXTREMITY: CATH118223

## 2021-05-30 LAB — POCT I-STAT, CHEM 8
BUN: 27 mg/dL — ABNORMAL HIGH (ref 8–23)
Calcium, Ion: 1.19 mmol/L (ref 1.15–1.40)
Chloride: 105 mmol/L (ref 98–111)
Creatinine, Ser: 1.8 mg/dL — ABNORMAL HIGH (ref 0.61–1.24)
Glucose, Bld: 58 mg/dL — ABNORMAL LOW (ref 70–99)
HCT: 47 % (ref 39.0–52.0)
Hemoglobin: 16 g/dL (ref 13.0–17.0)
Potassium: 3.8 mmol/L (ref 3.5–5.1)
Sodium: 143 mmol/L (ref 135–145)
TCO2: 28 mmol/L (ref 22–32)

## 2021-05-30 LAB — GLUCOSE, CAPILLARY
Glucose-Capillary: 122 mg/dL — ABNORMAL HIGH (ref 70–99)
Glucose-Capillary: 71 mg/dL (ref 70–99)

## 2021-05-30 LAB — PROTIME-INR
INR: 1.2 (ref 0.8–1.2)
Prothrombin Time: 15 seconds (ref 11.4–15.2)

## 2021-05-30 SURGERY — ABDOMINAL AORTOGRAM W/LOWER EXTREMITY
Anesthesia: LOCAL | Laterality: Right

## 2021-05-30 MED ORDER — SODIUM CHLORIDE 0.9 % IV SOLN: INTRAVENOUS | Status: DC

## 2021-05-30 MED ORDER — ONDANSETRON HCL 4 MG/2ML IJ SOLN: 4.0000 mg | Freq: Four times a day (QID) | INTRAMUSCULAR | Status: DC | PRN

## 2021-05-30 MED ORDER — DEXTROSE 50 % IV SOLN
12.5000 g | INTRAVENOUS | Status: AC
  Administered 2021-05-30: 12.5 g via INTRAVENOUS

## 2021-05-30 MED ORDER — HEPARIN (PORCINE) IN NACL 1000-0.9 UT/500ML-% IV SOLN
INTRAVENOUS | Status: AC
  Filled 2021-05-30: qty 500

## 2021-05-30 MED ORDER — SODIUM CHLORIDE 0.9% FLUSH: 3.0000 mL | Freq: Two times a day (BID) | INTRAVENOUS | Status: DC

## 2021-05-30 MED ORDER — IODIXANOL 320 MG/ML IV SOLN
INTRAVENOUS | Status: DC | PRN
  Administered 2021-05-30: 35 mL

## 2021-05-30 MED ORDER — LABETALOL HCL 5 MG/ML IV SOLN
10.0000 mg | INTRAVENOUS | Status: DC | PRN
Start: 2021-05-30 — End: 2021-05-30

## 2021-05-30 MED ORDER — SODIUM CHLORIDE 0.9 % WEIGHT BASED INFUSION: 1.0000 mL/kg/h | INTRAVENOUS | Status: DC

## 2021-05-30 MED ORDER — SODIUM CHLORIDE 0.9% FLUSH: 3.0000 mL | INTRAVENOUS | Status: DC | PRN

## 2021-05-30 MED ORDER — INSULIN ASPART 100 UNIT/ML FLEXPEN: 5.0000 [IU] | PEN_INJECTOR | Freq: Three times a day (TID) | SUBCUTANEOUS | 0 refills | Status: DC

## 2021-05-30 MED ORDER — DEXTROSE 50 % IV SOLN
INTRAVENOUS | Status: AC
  Filled 2021-05-30: qty 50

## 2021-05-30 MED ORDER — FENTANYL CITRATE (PF) 100 MCG/2ML IJ SOLN
INTRAMUSCULAR | Status: AC
  Filled 2021-05-30: qty 2

## 2021-05-30 MED ORDER — ROSUVASTATIN CALCIUM 10 MG PO TABS: 10.0000 mg | ORAL_TABLET | Freq: Every day | ORAL | 0 refills | Status: DC

## 2021-05-30 MED ORDER — HEPARIN (PORCINE) IN NACL 1000-0.9 UT/500ML-% IV SOLN
INTRAVENOUS | Status: DC | PRN
  Administered 2021-05-30 (×2): 500 mL

## 2021-05-30 MED ORDER — FENTANYL CITRATE (PF) 100 MCG/2ML IJ SOLN
INTRAMUSCULAR | Status: DC | PRN
  Administered 2021-05-30: 50 ug via INTRAVENOUS

## 2021-05-30 MED ORDER — LIDOCAINE HCL (PF) 1 % IJ SOLN
INTRAMUSCULAR | Status: DC | PRN
  Administered 2021-05-30: 12 mL

## 2021-05-30 MED ORDER — MIDAZOLAM HCL 2 MG/2ML IJ SOLN
INTRAMUSCULAR | Status: DC | PRN
  Administered 2021-05-30: 1 mg via INTRAVENOUS

## 2021-05-30 MED ORDER — FUROSEMIDE 40 MG PO TABS: 40.0000 mg | ORAL_TABLET | Freq: Every day | ORAL | 0 refills | Status: DC

## 2021-05-30 MED ORDER — MIDAZOLAM HCL 2 MG/2ML IJ SOLN
INTRAMUSCULAR | Status: AC
  Filled 2021-05-30: qty 2

## 2021-05-30 MED ORDER — SODIUM CHLORIDE 0.9 % IV SOLN: 250.0000 mL | INTRAVENOUS | Status: DC | PRN

## 2021-05-30 MED ORDER — HYDRALAZINE HCL 20 MG/ML IJ SOLN
5.0000 mg | INTRAMUSCULAR | Status: DC | PRN
  Administered 2021-05-30: 5 mg via INTRAVENOUS

## 2021-05-30 MED ORDER — LIDOCAINE HCL (PF) 1 % IJ SOLN
INTRAMUSCULAR | Status: AC
  Filled 2021-05-30: qty 30

## 2021-05-30 MED ORDER — HYDRALAZINE HCL 20 MG/ML IJ SOLN
INTRAMUSCULAR | Status: AC
  Filled 2021-05-30: qty 1

## 2021-05-30 MED ORDER — ACETAMINOPHEN 325 MG PO TABS: 650.0000 mg | ORAL_TABLET | ORAL | Status: DC | PRN

## 2021-05-30 SURGICAL SUPPLY — 14 items
CATH ANGIO 5F PIGTAIL 65CM (CATHETERS) ×2 IMPLANT
CATH SOFTOUCH MOTARJEME 5F (CATHETERS) ×4 IMPLANT
CATH SOS OMNI O 5F 80CM (CATHETERS) ×2 IMPLANT
DEVICE TORQUE .025-.038 (MISCELLANEOUS) ×2 IMPLANT
GUIDEWIRE ANGLED .035X150CM (WIRE) ×2 IMPLANT
KIT ANGIASSIST CO2 SYSTEM (KITS) ×2 IMPLANT
KIT MICROPUNCTURE NIT STIFF (SHEATH) ×2 IMPLANT
KIT PV (KITS) ×2 IMPLANT
SHEATH PINNACLE 5F 10CM (SHEATH) ×2 IMPLANT
SHEATH PROBE COVER 6X72 (BAG) ×2 IMPLANT
TRANSDUCER W/STOPCOCK (MISCELLANEOUS) ×2 IMPLANT
TRAY PV CATH (CUSTOM PROCEDURE TRAY) ×2 IMPLANT
WIRE BENTSON .035X145CM (WIRE) ×2 IMPLANT
WIRE ROSEN-J .035X180CM (WIRE) ×2 IMPLANT

## 2021-05-30 NOTE — Progress Notes (Signed)
SITE AREA: left groin/femoral  SITE PRIOR TO REMOVAL:  LEVEL 0  PRESSURE APPLIED FOR: approximately 20 minutes  MANUAL: yes  PATIENT STATUS DURING PULL: stable  POST PULL SITE:  LEVEL 0  POST PULL INSTRUCTIONS GIVEN: yes  POST PULL PULSES PRESENT: dopplerable, see flowsheet  DRESSING APPLIED: gauze with tegaderm  BEDREST BEGINS @ 1003  COMMENTS:

## 2021-05-30 NOTE — H&P (Signed)
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  He appears to have a stenosis in the distal bypass graft and I think this could put him at risk for graft thrombosis.  This reason I recommended arteriography to further define the area of concern.  Given that he has an aortobifemoral bypass graft there probably would not be any good endovascular options.  He would like to discuss this with his wife before scheduling the procedure.  We have discussed the indications for the procedure and the potential complications.  He is on Eliquis and this would have to be held prior to the procedure.  Fortunately he is not a smoker.  He is on aspirin and is on a statin.     HPI:    Kyle [name] is a pleasant 70 y.o. male who underwent an aortofemoral bypass graft in 2016.  He has known severe infrainguinal arterial occlusive disease.  He had a previous right femoropopliteal bypass in 2010 which is chronically occluded.  When I saw him last in June 2020 he was having rest pain of the right foot.  He underwent an arteriogram and his only option for revascularization on the right where he had a wound on the foot was a thin peroneal artery bypass.   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 Donnetta Hutching.  It looks like he was then lost to follow-up.   Since I saw him last, he denies any history of claudication, rest pain, or nonhealing ulcers.  I believe he was recently seen by Dr. Martinique who encouraged him to continue with follow-up of his graft and he was referred back to our  office.       Past Medical History:  Diagnosis Date   Aortic insufficiency      MODERATE WITH A BICUSPID AORTIC VAVLE   Arterial occlusive disease      MULTILEVEL   CHF (congestive heart failure) (HCC)     COPD (chronic obstructive pulmonary disease) (HCC)     Dilated cardiomyopathy ([place])      WITH EJECTION FRACTION DOWN TO 20-25%--WITH CONGESTIVE HEART FAILURE   Edema      LOWER EXTREMETIES   Hyperglycemia 01/2020   Hypertension     Normal coronary arteries 2009   Orthopnea     Peripheral arterial disease (HCC)     Pulmonary hypertension (HCC)     SOB (shortness of breath)             Family History  Problem Relation Age of Onset   Diabetes Mother     Hyperlipidemia Sister     Hypertension Sister        SOCIAL HISTORY: Social History         Tobacco Use   Smoking status: Former Smoker      Packs/day: 1.00      Years: 40.00      Pack years: 40.00      Types: Cigarettes      Quit date: 10/15/2018      Years since  quitting: 2.3   Smokeless tobacco: Never Used   Tobacco comment: quit 2010 and quit again 2019  Substance Use Topics   Alcohol use: No      Alcohol/week: 0.0 standard drinks      No Known Allergies         Current Outpatient Medications  Medication Sig Dispense Refill   ACCU-CHEK FASTCLIX LANCETS MISC         albuterol (PROVENTIL) (2.5 MG/3ML) 0.083% nebulizer solution Take 3 mLs (2.5 mg total) by nebulization every 6 (six) hours as needed for wheezing or shortness of breath. 75 mL 3   amLODipine (NORVASC) 5 MG tablet TAKE 1 TABLET EVERY DAY 90 tablet 3   DUREZOL 0.05 % EMUL         ELIQUIS 5 MG TABS tablet TAKE 1 TABLET TWICE DAILY 180 tablet 1   Fluticasone-Umeclidin-Vilant (TRELEGY ELLIPTA) 100-62.5-25 MCG/INH AEPB Inhale 1 puff into the lungs daily. 14 each 0   Fluticasone-Umeclidin-Vilant (TRELEGY ELLIPTA) 100-62.5-25 MCG/INH AEPB Inhale 1 puff into the lungs daily. 60 each 5   glucose blood (ACCU-CHEK GUIDE) test strip one strip (1 each  dose) by Other route 2 (two) times daily. Test blood glucose 2 times a day.       guaiFENesin (MUCINEX) 600 MG 12 hr tablet Take 2 tablets (1,200 mg total) by mouth 2 (two) times daily. (Patient taking differently: Take 600 mg by mouth 2 (two) times daily.) 30 tablet 0   insulin aspart (NOVOLOG) 100 UNIT/ML FlexPen Inject 3 Units into the skin 3 (three) times daily with meals. 15 mL 0   insulin detemir (LEVEMIR) 100 unit/ml SOLN Inject 0.25 mLs (25 Units total) into the skin daily. 8 mL 0   Insulin Pen Needle 32G X 4 MM MISC Use as directed with insulin pen       Multiple Vitamin (MULTIVITAMIN WITH MINERALS) TABS tablet Take 1 tablet by mouth daily.       OXYGEN Inhale 4.5 L into the lungs continuous.       rosuvastatin (CRESTOR) 10 MG tablet Take 1 tablet (10 mg total) by mouth daily at 6 PM. (Patient taking differently: Take 10 mg by mouth daily.) 90 tablet 0   carvedilol (COREG) 6.25 MG tablet Take 1 tablet (6.25 mg total) by mouth 2 (two) times daily with a meal. 60 tablet 1    No current facility-administered medications for this visit.      REVIEW OF SYSTEMS:  '[X]'$  denotes positive finding, '[ ]'$  denotes negative finding Cardiac   Comments:  Chest pain or chest pressure:      Shortness of breath upon exertion:      Short of breath when lying flat:      Irregular heart rhythm:             Vascular      Pain in calf, thigh, or hip brought on by ambulation:      Pain in feet at night that wakes you up from your sleep:      Blood clot in your veins:      Leg swelling:             Pulmonary      Oxygen at home:      Productive cough:      Wheezing:             Neurologic      Sudden weakness in arms or legs:      Sudden numbness in arms or  legs:      Sudden onset of difficulty speaking or slurred speech:      Temporary loss of vision in one eye:      Problems with dizziness:             Gastrointestinal      Blood in stool:      Vomited blood:             Genitourinary       Burning when urinating:      Blood in urine:             Psychiatric      Major depression:             Hematologic      Bleeding problems:      Problems with blood clotting too easily:             Skin      Rashes or ulcers:             Constitutional      Fever or chills:        PHYSICAL EXAM:       Vitals:    03/05/21 1526  BP: (!) 191/106  Pulse: 78  Resp: 20  Temp: 98.3 F (36.8 C)  SpO2: 90%  Weight: 160 lb (72.6 kg)  Height: '5\' 10"'$  (1.778 m)      GENERAL: The patient is a well-nourished male, in no acute distress. The vital signs are documented above. CARDIAC: There is a regular rate and rhythm. VASCULAR: I do not detect carotid bruits. He has palpable femoral pulses. I cannot palpate pedal pulses. PULMONARY: There is good air exchange bilaterally without wheezing or rales. ABDOMEN: Soft and non-tender with normal pitched bowel sounds. MUSCULOSKELETAL: There are no major deformities or cyanosis. NEUROLOGIC: No focal weakness or paresthesias are detected. SKIN: There are no ulcers or rashes noted. PSYCHIATRIC: The patient has a normal affect.   DATA:     ARTERIAL DOPPLER STUDY: I have independently interpreted his arterial Doppler study today.   On the right side he has a monophasic dorsalis pedis and posterior tibial signal with an ABI of 80%.  Toe pressures 100 mmHg.   On the left side he has a monophasic dorsalis pedis and posterior tibial signal.  ABI is 49%.  Toe pressures 58 mmHg.   ARTERIAL DUPLEX: I have independently interpreted his arterial duplex scan today.  This shows that his bypass graft is patent.  There appears to be a stenosis at the distal anastomosis in the mid calf area.  There is biphasic flow throughout the graft at peak systolic velocity in the proximal anastomosis is 32 cm/s.   [name] [facility]

## 2021-05-30 NOTE — Telephone Encounter (Signed)
Informed wife that pt's PAP medications are ready for pickup.   Novolog and Levemir flexpens are labeled and ready for pickup in med room fridge.

## 2021-05-30 NOTE — Progress Notes (Signed)
Pt ambulated without difficulty or bleeding.   Discharged home with his wife who will drive and stay with pt x 24 hrs. 

## 2021-05-30 NOTE — Op Note (Signed)
   PATIENT: Kyle [name]      MRN: [id number] DOB: 1951/05/20    DATE OF PROCEDURE: 05/30/2021  INDICATIONS:    DAEMOND CLEARWATER is a 70 y.o. male who is undergone a previous aortobifemoral bypass graft.  Subsequently he had repair of a pseudoaneurysm in the right groin with an interposition Dacron graft from the right limb of the graft to the deep femoral artery and then a right femoral to peroneal artery bypass with vein from the left leg.  On a follow-up duplex he had some elevated velocities at the distal anastomosis.  Peak systolic velocity was 99991111 cm/s.  He presents for arteriography.  PROCEDURE:    Conscious sedation Ultrasound-guided access to the left limb of his aortofemoral graft Selective catheterization of the right limb of aortofemoral graft CO2 aortogram Right lower extremity arteriogram  SURGEON: [name]. Scot Dock, MD, FACS  ANESTHESIA: Local with sedation  EBL: Minimal  TECHNIQUE: The patient was brought to the peripheral vascular lab and was sedated. The period of conscious sedation was 51 minutes.  During that time period, I was present face-to-face 100% of the time.  The patient was administered 1 mg of Versed and 50 mcg of fentanyl. The patient's heart rate, blood pressure, and oxygen saturation were monitored by the nurse continuously during the procedure.  Both groins were prepped and draped in the usual sterile fashion.  Under ultrasound guidance, after the skin was anesthetized, I cannulated the left limb of his aortobifemoral bypass graft.  A micropuncture sheath was introduced over the wire and exchanged for a 5 [place] sheath over a Rosen wire.  By ultrasound the left limb of the graft was patent.  A real-time image was sent to the server.    -Initially tried to cannulate the right limb of the aortofemoral graft with a shepherd's hook.  I had some difficulty so therefore exchanged for a pigtail catheter and did a CO2 aortogram to demonstrate the location of the  graft bifurcation.  I then went back with the previous catheter and was able to cannulate the right limb of the aortofemoral bypass graft.  Right lower extremity runoff film was obtained through this catheter.  At the completion this was removed over a wire.  The patient was transferred to the holding area for removal of the sheath.  No immediate complications were noted.  FINDINGS:   The aortofemoral bypass graft is widely patent. The Dacron graft from the right limb of the graft to the deep femoral artery is patent. The right femoral to peroneal artery bypass graft is patent with no areas of stenosis within the graft.  There did not appear to be significant narrowing at the distal anastomosis.  There is single-vessel runoff only via the peroneal artery.  CLINICAL NOTE: I did not see enough stenosis to consider surgical revision of the bypass.  I have recommended a follow-up duplex scan in 3 months.  If the velocities increase significantly then we could consider surgical revision.   [name], MD, FACS [facility]  DATE OF DICTATION:   05/30/2021

## 2021-06-02 NOTE — Progress Notes (Signed)
Please call patient and let them  know their  low dose Ct was read as a Lung RADS 2: nodules that are benign in appearance and behavior with a very low likelihood of becoming a clinically active cancer due to size or lack of growth. Recommendation per radiology is for a repeat LDCT in 12 months. .Please let them  know we will order and schedule their  annual screening scan for 04/2022. Please let them  know there was notation of CAD on their  scan.  Please remind the patient  that this is a non-gated exam therefore degree or severity of disease  cannot be determined. Please have them  follow up with their PCP regarding potential risk factor modification, dietary therapy or pharmacologic therapy if clinically indicated. Pt.  is  currently on statin therapy. Please place order for annual  screening scan for  04/2022 and fax results to PCP. Thanks so much.  There is notation of CAD, aortic atherosclerosis, enlarged pulmonic trunk.He is followed by Cardiology, and he is on a statin

## 2021-06-08 ENCOUNTER — Other Ambulatory Visit: Payer: Self-pay | Admitting: Family Medicine

## 2021-06-09 ENCOUNTER — Encounter: Payer: Self-pay | Admitting: *Deleted

## 2021-06-09 DIAGNOSIS — Z87891 Personal history of nicotine dependence: Secondary | ICD-10-CM

## 2021-06-18 ENCOUNTER — Ambulatory Visit: Payer: Medicare HMO | Admitting: Podiatry

## 2021-06-22 ENCOUNTER — Other Ambulatory Visit: Payer: Self-pay | Admitting: Family Medicine

## 2021-06-22 DIAGNOSIS — J449 Chronic obstructive pulmonary disease, unspecified: Secondary | ICD-10-CM

## 2021-06-22 MED ORDER — IPRATROPIUM BROMIDE 0.02 % IN SOLN: 0.5000 mg | Freq: Four times a day (QID) | RESPIRATORY_TRACT | 11 refills | Status: DC

## 2021-06-22 MED ORDER — DULERA 100-5 MCG/ACT IN AERO: 2.0000 | INHALATION_SPRAY | Freq: Two times a day (BID) | RESPIRATORY_TRACT | 3 refills | Status: DC

## 2021-06-22 MED ORDER — BUDESONIDE 0.25 MG/2ML IN SUSP: 0.2500 mg | Freq: Two times a day (BID) | RESPIRATORY_TRACT | 11 refills | Status: DC

## 2021-06-22 NOTE — Telephone Encounter (Signed)
Pulmicort and Atrovent nebs sent to pharmacy. Dulera refilled.   Lyndee Hensen, DO PGY-3, [place]

## 2021-06-23 ENCOUNTER — Other Ambulatory Visit (HOSPITAL_COMMUNITY): Payer: Self-pay

## 2021-07-01 ENCOUNTER — Telehealth: Payer: Self-pay | Admitting: Cardiology

## 2021-07-01 NOTE — Telephone Encounter (Signed)
Prescription sample request for Eliquis received. Pt qualifies for sample of eliquis '5mg'$ .  Routing to nl triage Indication:afib Last office visit:Kyle [name] 12/02/20 Scr:1.8 05/30/21 Age: 23mWeight:73.5kg

## 2021-07-01 NOTE — Telephone Encounter (Signed)
Patient calling the office for samples of medication:   1.  What medication and dosage are you requesting samples for? Eliquis  2.  Are you currently out of this medication? Pt is completely out of this medicine

## 2021-07-01 NOTE — Telephone Encounter (Signed)
No samples  available. Left message on home answering machine-- Informed patient to call again or call back if a prescription needs to call into the pharmacy

## 2021-07-03 ENCOUNTER — Telehealth: Payer: Self-pay | Admitting: Cardiology

## 2021-07-03 MED ORDER — APIXABAN 5 MG PO TABS: 5.0000 mg | ORAL_TABLET | Freq: Two times a day (BID) | ORAL | 1 refills | Status: DC

## 2021-07-03 NOTE — Telephone Encounter (Signed)
*  STAT* If patient is at the pharmacy, call can be transferred to refill team.   1. Which medications need to be refilled? (please list name of each medication and dose if known) apixaban (ELIQUIS) 5 MG TABS tablet   2. Which pharmacy/location (including street and city if local pharmacy) is medication to be sent to? [place], [place] - [phone]  3. Do they need a 30 day or 90 day supply? 30ds

## 2021-07-03 NOTE — Telephone Encounter (Signed)
Prescription refill request for Eliquis received. Indication:afib Last office visit:jordan 12/02/20 Scr:1.8 05/30/21 Age: 32mWeight:73.5kg

## 2021-07-14 ENCOUNTER — Telehealth: Payer: Self-pay | Admitting: Cardiology

## 2021-07-14 NOTE — Telephone Encounter (Signed)
Wife of the patient called. Valentino Hue will be faxing Dr. Martinique some paperwork to fill out regarding the patient's application for financial assistance.  Please be on the lookout for the paperwork

## 2021-07-14 NOTE — Telephone Encounter (Signed)
Routed to primary nurse to make aware.

## 2021-07-16 ENCOUNTER — Other Ambulatory Visit: Payer: Self-pay

## 2021-07-16 DIAGNOSIS — J449 Chronic obstructive pulmonary disease, unspecified: Secondary | ICD-10-CM

## 2021-07-17 MED ORDER — DULERA 100-5 MCG/ACT IN AERO: 2.0000 | INHALATION_SPRAY | Freq: Two times a day (BID) | RESPIRATORY_TRACT | 3 refills | Status: DC

## 2021-07-21 MED ORDER — APIXABAN 5 MG PO TABS: 5.0000 mg | ORAL_TABLET | Freq: Two times a day (BID) | ORAL | 3 refills | Status: DC

## 2021-07-21 NOTE — Telephone Encounter (Signed)
Called patient's wife left message on personal voice mail to call back.

## 2021-07-22 NOTE — Telephone Encounter (Signed)
Spoke to patient's wife advised I will mail patient Eliquis patient assistance form to complete.Advised to bring back to office along with proof of income.I will fax all forms to patient assistance.

## 2021-07-22 NOTE — Telephone Encounter (Signed)
Follow Up:     Patient's wife  is returning your call from yesterday

## 2021-07-29 ENCOUNTER — Other Ambulatory Visit: Payer: Self-pay | Admitting: Vascular Surgery

## 2021-08-01 ENCOUNTER — Other Ambulatory Visit: Payer: Self-pay

## 2021-08-01 ENCOUNTER — Telehealth: Payer: Self-pay | Admitting: Cardiology

## 2021-08-01 ENCOUNTER — Ambulatory Visit (INDEPENDENT_AMBULATORY_CARE_PROVIDER_SITE_OTHER): Payer: Medicare HMO | Admitting: Podiatry

## 2021-08-01 DIAGNOSIS — E1142 Type 2 diabetes mellitus with diabetic polyneuropathy: Secondary | ICD-10-CM

## 2021-08-01 DIAGNOSIS — I739 Peripheral vascular disease, unspecified: Secondary | ICD-10-CM

## 2021-08-01 DIAGNOSIS — B353 Tinea pedis: Secondary | ICD-10-CM | POA: Diagnosis not present

## 2021-08-01 DIAGNOSIS — L84 Corns and callosities: Secondary | ICD-10-CM

## 2021-08-01 DIAGNOSIS — M79674 Pain in right toe(s): Secondary | ICD-10-CM

## 2021-08-01 DIAGNOSIS — B351 Tinea unguium: Secondary | ICD-10-CM | POA: Diagnosis not present

## 2021-08-01 DIAGNOSIS — M79675 Pain in left toe(s): Secondary | ICD-10-CM

## 2021-08-01 MED ORDER — APIXABAN 5 MG PO TABS: 5.0000 mg | ORAL_TABLET | Freq: Two times a day (BID) | ORAL | 5 refills | Status: DC

## 2021-08-01 NOTE — Telephone Encounter (Signed)
Prescription refill request for Eliquis received. Indication:Atrial fib Last office visit:2/22 Scr:1.8 Age: 70 Weight:73.9 kg  Prescription refilled

## 2021-08-01 NOTE — Telephone Encounter (Signed)
Patient calling the office for samples of medication:   1.  What medication and dosage are you requesting samples for? apixaban (ELIQUIS) 5 MG TABS tablet  2.  Are you currently out of this medication? No, has 3 tablets left

## 2021-08-01 NOTE — Telephone Encounter (Signed)
I spoke to the patient and refilled his Eliquis.  He verbalized understanding

## 2021-08-08 ENCOUNTER — Ambulatory Visit (HOSPITAL_BASED_OUTPATIENT_CLINIC_OR_DEPARTMENT_OTHER): Payer: Medicare HMO | Admitting: Orthopaedic Surgery

## 2021-08-08 ENCOUNTER — Encounter: Payer: Self-pay | Admitting: Podiatry

## 2021-08-08 NOTE — Progress Notes (Signed)
  Subjective:  Patient ID: Kyle [name], male    DOB: [birth year],  MRN: [id number]  Kyle [name] presents to clinic today for at risk foot care. Pt has h/o NIDDM with PAD and thick, elongated toenails b/l feet which are tender when wearing enclosed shoe gear.  Patient states blood glucose was 114 mg/dl today.    PCP is Lyndee Hensen, DO , and last visit was 05/02/2021.  No Known Allergies  Review of Systems: Negative except as noted in the HPI. Objective:   Constitutional Kyle [name] is a pleasant 70 y.o. African American male, WD, WN in NAD. AAO x 3.   Vascular Capillary fill time to digits <3 seconds b/l lower extremities. Palpable DP pulse(s) b/l lower extremities Palpable PT pulse(s) b/l lower extremities Pedal hair absent. Lower extremity skin temperature gradient within normal limits. No pain with calf compression b/l. No edema noted b/l lower extremities. No cyanosis or clubbing noted.  Neurologic Normal speech. Oriented to person, place, and time. Protective sensation diminished with 10g monofilament b/l.  Dermatologic Skin warm and supple b/l lower extremities. No open wounds b/l lower extremities. No interdigital macerations b/l lower extremities. Toenails 1-5 b/l elongated, discolored, dystrophic, thickened, crumbly with subungual debris and tenderness to dorsal palpation. Hyperkeratotic lesion(s) submet head 1 right foot, submet head 5 left foot, and submet head 5 right foot.  No erythema, no edema, no drainage, no fluctuance. Diffuse scaling noted peripherally and plantarly b/l feet.  No interdigital macerations.  No blisters, no weeping. No signs of secondary bacterial infection noted.  Orthopedic: Normal muscle strength 5/5 to all lower extremity muscle groups bilaterally. Hammertoe(s) noted to the 2-5 bilaterally and R hallux.   Radiographs: None Assessment:   1. Pain due to onychomycosis of toenails of both feet   2. Tinea pedis of both feet   3. Callus   4.  Diabetic peripheral neuropathy associated with type 2 diabetes mellitus ([place])   5. PAD (peripheral artery disease) ([place])    Plan:  Patient was evaluated and treated and all questions answered. Consent given for treatment as described below: -Examined patient. -He has Rx for Ketoconazole. We will try another course He is to continue applying to both feet and between toes once daily for 6 weeks.. -Continue diabetic foot care principles: inspect feet daily, monitor glucose as recommended by PCP and/or Endocrinologist, and follow prescribed diet per PCP, Endocrinologist and/or dietician. -Patient to continue soft, supportive shoe gear daily. -Toenails 1-5 b/l were debrided in length and girth with sterile nail nippers and dremel without iatrogenic bleeding.  -Callus(es) submet head 1 right foot, submet head 5 left foot, and submet head 5 right foot pared utilizing sterile scalpel blade without complication or incident. Total number debrided =3. -Patient to report any pedal injuries to medical professional immediately. -Patient/POA to call should there be question/concern in the interim.  Return in about 3 months (around 11/01/2021).  Marzetta Board, DPM

## 2021-08-22 NOTE — Telephone Encounter (Signed)
[name] is following up on [employer].

## 2021-08-26 NOTE — Progress Notes (Deleted)
Cardiology Office Note:    Date:  08/26/2021   ID:  Kyle [name], DOB [birth year], MRN [id number]  PCP:  Lyndee Hensen, [place] Cardiologist: Peter Martinique, MD   Reason for visit: 34-month follow-up  History of Present Illness:    Kyle [name] is a 70 y.o. male with a hx of chronic systolic heart failure, normal coronary arteries by LHC, bicuspid aortic valve with moderate AS/AI, hypertension, hyperlipidemia, A-fib in the setting of sepsis, former tobacco use, IDDM, advanced emphysema on home oxygen intermittently, CKD and PAD with aortofemoral bypass graft in 2016 & right femoropopliteal bypass in 2010.    At one point he was lost to follow-up and was not taking medication for over 2 years.  Chronic systolic heart failure -Nonischemic.   -Echo 03/2021: Hypokinesis of the inferolateral wall,EF 45 to 08%, grade 1 diastolic dysfunction, normal RV function, mildly dilated left atria, mild mitral regurgitation, moderate aortic regurgitation, mild to moderate aortic valve stenosis. -EF normalized in the past noncompliant with medications.  ARB/ACE/Spiro secondary to CKD -***  Biscuspid AV with mild-mod AS/moderate AI -Monitor for CHF symptoms, chest pain and syncope.  Hypertension -*** -Goal BP is <130/80.  Recommend DASH diet (high in vegetables, fruits, low-fat dairy products, whole grains, poultry, fish, and nuts and low in sweets, sugar-sweetened beverages, and red meats), salt restriction and increase physical activity.  Hyperlipidemia -*** -Discussed cholesterol lowering diets - Mediterranean diet, DASH diet, vegetarian diet, low-carbohydrate diet and avoidance of trans fats.  Discussed healthier choice substitutes.  Nuts, high-fiber foods, and fiber supplements may also improve lipids.    PAD -aortofemoral bypass graft in 2016; right femoropopliteal bypass in 2010 which is chronically occluded; repair of a right femoral artery pseudoaneurysm 03/2019 -LE angio  05/2021: aortofemoral bypass graft is widely patent; right femoral to peroneal artery bypass graft is patent; there is single-vessel runoff only via the peroneal artery. -Followed by Vascular - Dr. Dickson/Dr. Early  Disposition - Follow-up in ***   Past Medical History:  Diagnosis Date   Aortic insufficiency    MODERATE WITH A BICUSPID AORTIC VAVLE   Arterial occlusive disease    MULTILEVEL   CHF (congestive heart failure) (HCC)    COPD (chronic obstructive pulmonary disease) ([place])    Dilated cardiomyopathy ([place])    WITH EJECTION FRACTION DOWN TO 20-25%--WITH CONGESTIVE HEART FAILURE   Edema    LOWER EXTREMETIES   Hyperglycemia 01/2020   Hypertension    Normal coronary arteries 2009   Orthopnea    Peripheral arterial disease ([place])    Pulmonary hypertension ([place])     Past Surgical History:  Procedure Laterality Date   ABDOMINAL AORTAGRAM N/A 11/05/2014   Procedure: ABDOMINAL Maxcine Ham;  Surgeon: Angelia Mould, MD;  Location[facility] CATH LAB;  Service: Cardiovascular;  Laterality: N/A;   ABDOMINAL AORTOGRAM W/LOWER EXTREMITY Bilateral 04/21/2019   Procedure: ABDOMINAL AORTOGRAM W/LOWER EXTREMITY;  Surgeon: Angelia Mould, MD;  Location: [place] CV LAB;  Service: Cardiovascular;  Laterality: Bilateral;   ABDOMINAL AORTOGRAM W/LOWER EXTREMITY Right 05/30/2021   Procedure: ABDOMINAL AORTOGRAM W/LOWER EXTREMITY;  Surgeon: Angelia Mould, MD;  Location: [place] CV LAB;  Service: Cardiovascular;  Laterality: Right;   AORTA - BILATERAL FEMORAL ARTERY BYPASS GRAFT N/A 12/18/2014   Procedure: AORTOBIFEMORAL BYPASS GRAFT;  Surgeon: Angelia Mould, MD;  Location: [place];  Service: Vascular;  Laterality: N/A;   CARDIAC CATHETERIZATION  2009   Nl Cors, EF 20%   COLONOSCOPY  ENDARTERECTOMY FEMORAL Left 12/18/2014   Procedure: ENDARTERECTOMY FEMORAL;  Surgeon: Angelia Mould, MD;  Location: [place];  Service: Vascular;  Laterality: Left;   FALSE ANEURYSM  REPAIR Right 04/24/2019   Procedure: REPAIR OF RIGHT FEMORAL ARTERY PSEUDOANEURYSM;  Surgeon: Rosetta Posner, MD;  Location: [facility] OR;  Service: Vascular;  Laterality: Right;   FEMORAL-POPLITEAL BYPASS GRAFT  02/21/2011   right   FEMORAL-TIBIAL BYPASS GRAFT Right 04/24/2019   Procedure: REDO BYPASS GRAFT RIGHT FEMORAL-TIBIAL ARTERY USING LEFT LEG VEIN & HEMASHIELD GOLD 71mm GRAFT;  Surgeon: Rosetta Posner, MD;  Location: [place];  Service: Vascular;  Laterality: Right;   IR FLUORO GUIDE CV LINE RIGHT  12/28/2019   IR REMOVAL TUN CV CATH W/O FL  01/11/2020   IR US GUIDE VASC ACCESS RIGHT  12/28/2019   OTHER SURGICAL HISTORY     POST RIGHT FEMOROPOPLITEAL BYPASS GRAFT   PERIPHERAL VASCULAR CATHETERIZATION  11/05/2014   Procedure: LOWER EXTREMITY ANGIOGRAPHY;  Surgeon: Angelia Mould, MD;  Location[facility] CATH LAB;  Service: Cardiovascular;;   VEIN HARVEST Left 04/24/2019   Procedure: LEFT LEG SAPHENOUS VEIN HARVEST;  Surgeon: Rosetta Posner, MD;  Location: [place] OR;  Service: Vascular;  Laterality: Left;    Current Medications: No outpatient medications have been marked as taking for the 08/27/21 encounter (Appointment) with Warren Lacy, PA-C.     Allergies:   Patient has no known allergies.   Social History   Socioeconomic History   Marital status: Married    Spouse name: Not on file   Number of children: 1   Years of education: Not on file   Highest education level: Not on file  Occupational History   Occupation: retired    Fish farm manager: [employer]  Tobacco Use   Smoking status: Former    Packs/day: 1.00    Years: 40.00    Pack years: 40.00    Types: Cigarettes    Quit date: 10/15/2018    Years since quitting: 2.8   Smokeless tobacco: Never   Tobacco comments:    quit 2010 and quit again 2019  Vaping Use   Vaping Use: Never used  Substance and Sexual Activity   Alcohol use: No    Alcohol/week: 0.0 standard drinks   Drug use: No   Sexual activity: Not on file  Other Topics  Concern   Not on file  Social History Narrative   Not on file   Social Determinants of Health   Financial Resource Strain: Not on file  Food Insecurity: Not on file  Transportation Needs: Not on file  Physical Activity: Not on file  Stress: Not on file  Social Connections: Not on file     Family History: The patient's family history includes Diabetes in his mother; Hyperlipidemia in his sister; Hypertension in his sister.  ROS:   Please see the history of present illness.     EKGs/Labs/Other Studies Reviewed:    EKG:  The ekg ordered today demonstrates ***  Recent Labs: 04/07/2021: ALT 41; B Natriuretic Peptide 1,899.5 04/08/2021: Magnesium 2.1; TSH 0.382 05/06/2021: Platelets 196 05/30/2021: BUN 27; Creatinine, Ser 1.80; Hemoglobin 16.0; Potassium 3.8; Sodium 143   Recent Lipid Panel Lab Results  Component Value Date/Time   CHOL 82 04/09/2021 03:17 AM   CHOL 166 11/30/2019 09:37 AM   TRIG 70 04/09/2021 03:17 AM   HDL 31 (L) 04/09/2021 03:17 AM   HDL 41 11/30/2019 09:37 AM   LDLCALC 37 04/09/2021 03:17 AM  LDLCALC 101 (H) 11/30/2019 09:37 AM    Physical Exam:    VS:  There were no vitals taken for this visit.   No data found.  Wt Readings from Last 3 Encounters:  05/30/21 163 lb (73.9 kg)  05/06/21 162 lb 0.6 oz (73.5 kg)  05/02/21 162 lb (73.5 kg)     GEN: *** Well nourished, well developed in no acute distress HEENT: Normal NECK: No JVD; No carotid bruits CARDIAC: ***RRR, no murmurs, rubs, gallops RESPIRATORY:  Clear to auscultation without rales, wheezing or rhonchi  ABDOMEN: Soft, non-tender, non-distended MUSCULOSKELETAL: No edema; No deformity  SKIN: Warm and dry NEUROLOGIC:  Alert and oriented PSYCHIATRIC:  Normal affect     ASSESSMENT AND PLAN   ***   {Are you ordering a CV Procedure (e.g. stress test, cath, DCCV, TEE, etc)?   Press F2        :[id number]}    Medication Adjustments/Labs and Tests Ordered: Current medicines are reviewed at  length with the patient today.  Concerns regarding medicines are outlined above.  No orders of the defined types were placed in this encounter.  No orders of the defined types were placed in this encounter.   There are no Patient Instructions on file for this visit.   Signed, Warren Lacy, PA-C  08/26/2021 10:28 PM    [facility]

## 2021-08-27 ENCOUNTER — Ambulatory Visit: Payer: Medicare HMO | Admitting: Physician Assistant

## 2021-08-27 DIAGNOSIS — I5022 Chronic systolic (congestive) heart failure: Secondary | ICD-10-CM

## 2021-08-27 DIAGNOSIS — I739 Peripheral vascular disease, unspecified: Secondary | ICD-10-CM

## 2021-08-27 DIAGNOSIS — I10 Essential (primary) hypertension: Secondary | ICD-10-CM

## 2021-08-27 DIAGNOSIS — E785 Hyperlipidemia, unspecified: Secondary | ICD-10-CM

## 2021-08-28 ENCOUNTER — Other Ambulatory Visit: Payer: Self-pay

## 2021-08-28 DIAGNOSIS — I739 Peripheral vascular disease, unspecified: Secondary | ICD-10-CM

## 2021-09-04 ENCOUNTER — Other Ambulatory Visit (HOSPITAL_COMMUNITY): Payer: Medicare HMO

## 2021-09-04 ENCOUNTER — Ambulatory Visit: Payer: Medicare HMO | Admitting: Vascular Surgery

## 2021-09-04 ENCOUNTER — Encounter (HOSPITAL_COMMUNITY): Payer: Medicare HMO

## 2021-09-09 ENCOUNTER — Other Ambulatory Visit: Payer: Self-pay | Admitting: Vascular Surgery

## 2021-09-12 NOTE — Telephone Encounter (Signed)
Called patient's wife left message on personal voice mail I never received [employer] patient assistance paperwork.Advised Dr.Jordan's part is complete just waiting on [employer]'s part so I can fax altogether.

## 2021-09-22 ENCOUNTER — Other Ambulatory Visit: Payer: Self-pay | Admitting: Vascular Surgery

## 2021-09-23 ENCOUNTER — Telehealth: Payer: Self-pay

## 2021-09-23 NOTE — Telephone Encounter (Signed)
Left message regarding medication pickup.  Pts medications are labeled & ready in med room fridge (novolog pens & levemir pens).

## 2021-09-30 ENCOUNTER — Other Ambulatory Visit: Payer: Self-pay

## 2021-09-30 NOTE — Telephone Encounter (Signed)
Patient presents to clinic for medications. Provided per note from [place].   Talbot Grumbling, RN

## 2021-10-09 ENCOUNTER — Ambulatory Visit (HOSPITAL_COMMUNITY)
Admission: RE | Admit: 2021-10-09 | Discharge: 2021-10-09 | Disposition: A | Discharge status: Home / Self Care | Payer: Medicare HMO | Source: Ambulatory Visit | Attending: Vascular Surgery | Admitting: Vascular Surgery

## 2021-10-09 ENCOUNTER — Ambulatory Visit (INDEPENDENT_AMBULATORY_CARE_PROVIDER_SITE_OTHER)
Admission: RE | Admit: 2021-10-09 | Discharge: 2021-10-09 | Disposition: A | Discharge status: Home / Self Care | Payer: Medicare HMO | Source: Ambulatory Visit | Attending: Vascular Surgery | Admitting: Vascular Surgery

## 2021-10-09 ENCOUNTER — Encounter: Payer: Self-pay | Admitting: Vascular Surgery

## 2021-10-09 ENCOUNTER — Ambulatory Visit (INDEPENDENT_AMBULATORY_CARE_PROVIDER_SITE_OTHER): Payer: Medicare HMO | Admitting: Vascular Surgery

## 2021-10-09 ENCOUNTER — Other Ambulatory Visit: Payer: Self-pay

## 2021-10-09 VITALS — BP 204/108 | HR 80 | Temp 98.2°F | Resp 20 | Ht 70.0 in | Wt 176.0 lb

## 2021-10-09 DIAGNOSIS — I739 Peripheral vascular disease, unspecified: Secondary | ICD-10-CM | POA: Insufficient documentation

## 2021-10-09 NOTE — Progress Notes (Signed)
REASON FOR VISIT:   Follow-up of peripheral arterial disease.  MEDICAL ISSUES:   PERIPHERAL ARTERIAL DISEASE: His aorto bifemoral bypass graft is patent and his right fem peroneal bypass graft is patent.  The elevated velocities in the distal anastomosis have not changed.  He previously had an arteriogram because of elevated velocities and no significant stenosis was found.  He is not a smoker.  I encouraged him to stay as active as possible.  I think we can stretch his follow-up out in 9 months.  I have ordered a follow-up graft duplex and ABIs in 9 months and I will see him back at that time.  He knows to call sooner if he has problems.  HYPERTENSION: The patient's initial blood pressure today was elevated. We repeated this and this was still elevated. We have encouraged the patient to follow up with their primary care physician for management of their blood pressure.   HPI:   Kyle [name] is a pleasant 70 y.o. male who comes in for routine follow-up visit.  He had an aortobifemoral bypass graft and left femoral endarterectomy in February 2016.  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  He then had a right femoral to peroneal artery bypass with vein from the left leg.  On a follow-up duplex he had elevated velocities at the distal anastomosis with a peak systolic velocity of 224 cm/s.  This prompted a CO2 arteriogram and right lower extremity arteriogram.  His arteriogram showed that the right femoral to peroneal artery bypass graft was patent with no areas of stenosis in the graft.  There did not appear to be significant narrowing at the distal anastomosis.  There was single-vessel runoff on the right via the peroneal artery.  Based on these findings I recommended a follow-up duplex scan in 3 months.  He comes in for that study.  Since I saw him last, he  denies any claudication.  He denies rest pain.  He has had no nonhealing ulcers.  He has noted some mild left lower extremity swelling which is not new.   Past Medical History:  Diagnosis Date   Aortic insufficiency    MODERATE WITH A BICUSPID AORTIC VAVLE   Arterial occlusive disease    MULTILEVEL   CHF (congestive heart failure) (HCC)    COPD (chronic obstructive pulmonary disease) (HCC)    Dilated cardiomyopathy (HCC)    WITH EJECTION FRACTION DOWN TO 20-25%--WITH CONGESTIVE HEART FAILURE   Edema    LOWER EXTREMETIES   Hyperglycemia 01/2020   Hypertension    Normal coronary arteries 2009   Orthopnea    Peripheral arterial disease ([place])    Pulmonary hypertension ([place])     Family History  Problem Relation Age of Onset   Diabetes Mother    Hyperlipidemia Sister    Hypertension Sister     SOCIAL HISTORY: Social History   Tobacco Use   Smoking status: Former    Packs/day: 1.00    Years: 40.00    Pack years: 40.00    Types: Cigarettes    Quit date: 10/15/2018    Years since quitting: 2.9   Smokeless tobacco: Never   Tobacco comments:    quit 2010 and quit again 2019  Substance Use Topics   Alcohol use: No    Alcohol/week: 0.0 standard drinks    No Known Allergies  Current Outpatient Medications  Medication Sig Dispense Refill   ACCU-CHEK FASTCLIX LANCETS MISC      albuterol (VENTOLIN HFA) 108 (90 Base) MCG/ACT inhaler Inhale 2 puffs into the lungs every 6 (six) hours as needed for wheezing or shortness of breath. 8 g 2   apixaban (ELIQUIS) 5 MG TABS tablet Take 1 tablet (5 mg total) by mouth 2 (two) times daily. 60 tablet 5   budesonide (PULMICORT) 0.25 MG/2ML nebulizer solution Take 2 mLs (0.25 mg total) by nebulization 2 (two) times daily. Start this AFTER you are done with the inhalers you have 180 each 11   Fluticasone-Umeclidin-Vilant (TRELEGY ELLIPTA) 100-62.5-25 MCG/INH AEPB Inhale 1 puff into the lungs daily. 1 each 11   glucose blood (ACCU-CHEK GUIDE)  test strip one strip (1 each dose) by Other route 2 (two) times daily. Test blood glucose 2 times a day.     guaiFENesin (MUCINEX) 600 MG 12 hr tablet Take 2 tablets (1,200 mg total) by mouth 2 (two) times daily. (Patient taking differently: Take 600 mg by mouth 2 (two) times daily.) 30 tablet 0   insulin aspart (NOVOLOG) 100 UNIT/ML FlexPen Inject 5 Units into the skin 3 (three) times daily with meals. 30 mL 0   insulin detemir (LEVEMIR) 100 unit/ml SOLN Inject 28 Units into the skin in the morning.     Insulin Pen Needle 32G X 4 MM MISC Use as directed with insulin pen     ipratropium (ATROVENT) 0.02 % nebulizer solution Take 2.5 mLs (0.5 mg total) by nebulization in the morning, at noon, in the evening, and at bedtime. Start this AFTER you are done with the inhalers you have. Inhale via nebulizer in the morning and in the evening every day. May use an additional two times a day if needed for shortness of breath. 75 mL 11   isosorbide mononitrate (IMDUR) 30 MG 24 hr tablet Take 1 tablet by mouth once daily 30 tablet 3   ketoconazole (NIZORAL) 2 % cream Apply to both feet and between toes once daily for 6 weeks. 60 g 0   levETIRAcetam (KEPPRA) 500 MG tablet Take 500 mg by mouth in the morning.     levETIRAcetam (KEPPRA) 500 MG tablet Take 1 tablet by mouth daily.     Misc. Devices (ADJUSTABLE COMMODE 3-IN-1) MISC Use daily 1 each 0   mometasone-formoterol (DULERA) 100-5 MCG/ACT AERO Inhale 2 puffs into the lungs 2 (two) times daily. 1 each 3   Multiple Vitamins-Minerals (CENTRUM SILVER 50+MEN) TABS Take 1 tablet by mouth daily with breakfast.     OXYGEN Inhale 4.5 L/min into the lungs continuous.     rosuvastatin (CRESTOR) 10 MG tablet TAKE 1 TABLET BY MOUTH AT BEDTIME 30 tablet 0   carvedilol (COREG) 6.25 MG tablet Take 1 tablet (6.25 mg total) by mouth 2 (two) times daily with a meal. 60 tablet 1   furosemide (LASIX) 40 MG tablet Take 1 tablet (40 mg total) by mouth daily. (Patient not taking:  Reported on 10/09/2021) 10 tablet 0   hydrALAZINE (APRESOLINE) 25 MG tablet Take 1 tablet (25 mg total) by mouth every 8 (eight) hours. 90 tablet 3   No current facility-administered medications for this visit.    REVIEW OF SYSTEMS:  [X]  denotes positive finding, [ ]  denotes negative finding Cardiac  Comments:  Chest pain or chest pressure:    Shortness of breath upon exertion:    Short of breath when lying flat:    Irregular heart rhythm:  Vascular    Pain in calf, thigh, or hip brought on by ambulation:    Pain in feet at night that wakes you up from your sleep:     Blood clot in your veins:    Leg swelling:  x       Pulmonary    Oxygen at home:    Productive cough:     Wheezing:         Neurologic    Sudden weakness in arms or legs:     Sudden numbness in arms or legs:     Sudden onset of difficulty speaking or slurred speech:    Temporary loss of vision in one eye:     Problems with dizziness:         Gastrointestinal    Blood in stool:     Vomited blood:         Genitourinary    Burning when urinating:     Blood in urine:        Psychiatric    Major depression:         Hematologic    Bleeding problems:    Problems with blood clotting too easily:        Skin    Rashes or ulcers:        Constitutional    Fever or chills:     PHYSICAL EXAM:   Vitals:   10/09/21 1601 10/09/21 1604  BP: (!) 219/104 (!) 204/108  Pulse: 80   Resp: 20   Temp: 98.2 F (36.8 C)   Weight: 176 lb (79.8 kg)   Height: 5\' 10"  (1.778 m)     GENERAL: The patient is a well-nourished male, in no acute distress. The vital signs are documented above. CARDIAC: There is a regular rate and rhythm.  VASCULAR: I do not detect carotid bruits. He has palpable femoral pulses. He has an easily palpable graft pulse behind his knee on the right. Both feet are warm and well-perfused. He has mild left lower extremity swelling.  His vein was harvested from the left leg. PULMONARY:  There is good air exchange bilaterally without wheezing or rales. ABDOMEN: Soft and non-tender with normal pitched bowel sounds.  MUSCULOSKELETAL: There are no major deformities or cyanosis. NEUROLOGIC: No focal weakness or paresthesias are detected. SKIN: There are no ulcers or rashes noted. PSYCHIATRIC: The patient has a normal affect.  DATA:    ARTERIAL DOPPLER STUDY: I have independently interpreted his arterial Doppler study today.  On the right side there is a monophasic posterior tibial signal with a biphasic dorsalis pedis signal.  ABI is 85%.  Toe pressures 143 mmHg.  On the left side there is a monophasic dorsalis pedis and posterior tibial signal.  ABI is 62%.  Toe pressures 106 mmHg.  GRAFT DUPLEX: I have independently interpreted his graft duplex scan today.  His right femoral peroneal bypass graft is patent.  He does have elevated velocities in the distal anastomosis with a peak systolic velocity of 498 cm/s.  [name] [facility] [phone]

## 2021-10-10 ENCOUNTER — Other Ambulatory Visit: Payer: Self-pay

## 2021-10-10 DIAGNOSIS — I739 Peripheral vascular disease, unspecified: Secondary | ICD-10-CM

## 2021-10-10 NOTE — Telephone Encounter (Signed)
Never received patient's patient assistance form for Eliquis.

## 2021-10-14 ENCOUNTER — Encounter: Payer: Self-pay | Admitting: Pulmonary Disease

## 2021-10-14 ENCOUNTER — Ambulatory Visit (INDEPENDENT_AMBULATORY_CARE_PROVIDER_SITE_OTHER): Payer: Medicare HMO | Admitting: Pulmonary Disease

## 2021-10-14 ENCOUNTER — Other Ambulatory Visit: Payer: Self-pay

## 2021-10-14 ENCOUNTER — Other Ambulatory Visit: Payer: Self-pay | Admitting: Vascular Surgery

## 2021-10-14 DIAGNOSIS — I272 Pulmonary hypertension, unspecified: Secondary | ICD-10-CM | POA: Diagnosis not present

## 2021-10-14 DIAGNOSIS — J449 Chronic obstructive pulmonary disease, unspecified: Secondary | ICD-10-CM

## 2021-10-14 DIAGNOSIS — I5022 Chronic systolic (congestive) heart failure: Secondary | ICD-10-CM | POA: Diagnosis not present

## 2021-10-14 MED ORDER — FUROSEMIDE 40 MG PO TABS: 40.0000 mg | ORAL_TABLET | Freq: Every day | ORAL | 2 refills | Status: DC

## 2021-10-14 NOTE — Assessment & Plan Note (Signed)
Seems to be a combination of WHO 2 and 3 Ideally he needs right heart cath to evaluate but probably needs to be optimized first He has significant pedal edema and we will refill his Lasix and ask him to take this based on weight monitoring and also appearance of edema

## 2021-10-14 NOTE — Addendum Note (Signed)
Addended by: Retia Passe on: 10/14/2021 03:57 PM   Modules accepted: Orders

## 2021-10-14 NOTE — Patient Instructions (Addendum)
°   X Refill x 2 on lasix  X flu shot

## 2021-10-14 NOTE — Progress Notes (Signed)
° °  Subjective:    Patient ID: Kyle [name], male    DOB: [birth year], 70 y.o.   MRN: [id number]  HPI   70 yo smoker for follow-up of emphysema and chronic respiratory failure on oxygen PMH - cardiomyopathy, HFrEF     He uses 5 L pulse on his POC and at home uses 4 to 5 L of continuous O2.  He was hospitalized 3 times in 2021, initially 11/2019 for AKI due to increased diuretic, then in 12/2019 for fluid in his lungs once his diuretic was decreased and then in 01/2020 for hyperglycemia since his insulin dose was changed.  Chief Complaint  Patient presents with   Follow-up    Pt states that breathing is ok. Having trouble sleeping at night due to nose being dry.     73-month follow-up visit He uses oxygen during sleep, POC is heavy and he does not take it out in the daytime.  He underwent right femoral peroneal bypass by vascular surgery. He is noted increasing bipedal edema.  He admits that he is run out of Lasix. He seems to be compliant by Trelegy by report, I also note budesonide and Dulera on his medication list but he states he is not taking these.    Significant tests/ events reviewed   LDCT 04/2021 RADS2 - RLL nodule unchanged  LDCT 02/2020 - lung RADS 2.  Moderate to advanced changes of emphysema.  And tiny nodule in the left base at 4.7 mm.   11/20/2018-CT head without contrast- sinuses normal   12/16/2018-spirometry- FVC 1.7 (43% predicted), ratio 64, FEV1 1.1 (36% predicted), consistent with severe airway obstruction    03/2021 echo LVEF 45 to 50%, RVSP 55 11/21/2018-echocardiogram-LV ejection fraction 40 to 45%, diffuse hypokinesis, right ventricle moderately dilated, systolic function was moderately reduced, PA P pressure 53  Review of Systems neg for any significant sore throat, dysphagia, itching, sneezing, nasal congestion or excess/ purulent secretions, fever, chills, sweats, unintended wt loss, pleuritic or exertional cp, hempoptysis, orthopnea pnd or change in  chronic leg swelling. Also denies presyncope, palpitations, heartburn, abdominal pain, nausea, vomiting, diarrhea or change in bowel or urinary habits, dysuria,hematuria, rash, arthralgias, visual complaints, headache, numbness weakness or ataxia.     Objective:   Physical Exam  Gen. Pleasant, well-nourished, in no distress, normal affect ENT - no pallor,icterus, no post nasal drip Neck: No JVD, no thyromegaly, no carotid bruits Lungs: no use of accessory muscles, no dullness to percussion, clear without rales or rhonchi  Cardiovascular: Rhythm regular, heart sounds  normal, no murmurs or gallops, 2+ peripheral edema Abdomen: soft and non-tender, no hepatosplenomegaly, BS normal. Musculoskeletal: No deformities, no cyanosis or clubbing Neuro:  alert, non focal       Assessment & Plan:

## 2021-10-14 NOTE — Assessment & Plan Note (Signed)
Continue Trelegy He will be provided refills on his albuterol to use as needed. Budesonide and Dulera were discontinued from his medication list.  We discussed signs and symptoms and plan for exacerbation. Flu shot today

## 2021-10-14 NOTE — Assessment & Plan Note (Signed)
Lasix prescription will be renewed. I asked him to follow-up with cardiology

## 2021-10-16 ENCOUNTER — Ambulatory Visit: Payer: Medicare HMO | Admitting: Family Medicine

## 2021-10-16 IMAGING — US US RENAL
1 series · 14 of 24 positions shown · non-contrast
Comparison: CT 12/24/2019

CLINICAL DATA: Chronic kidney disease.

EXAM:
RENAL / URINARY TRACT ULTRASOUND COMPLETE

[Series 1: us renal · 14 of 24 slices shown]
[im 1/24]
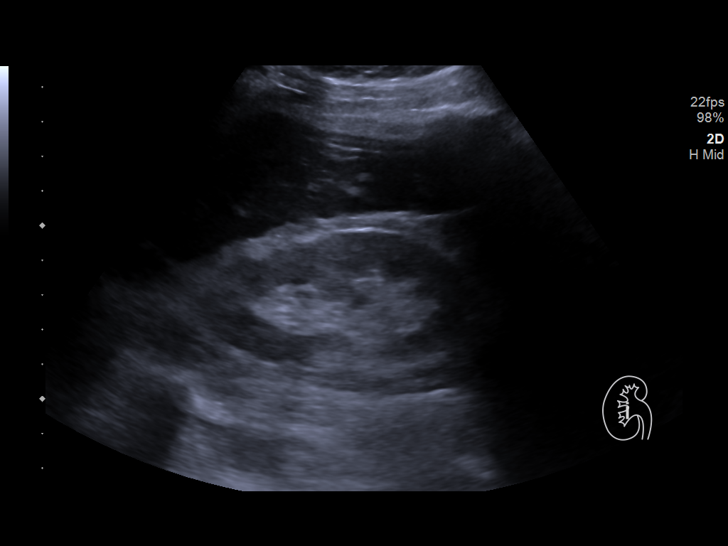
[im 3/24]
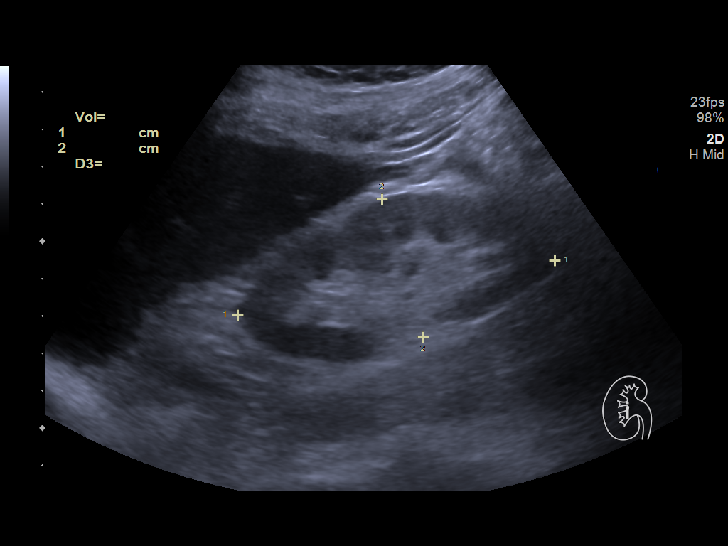
[im 5/24]
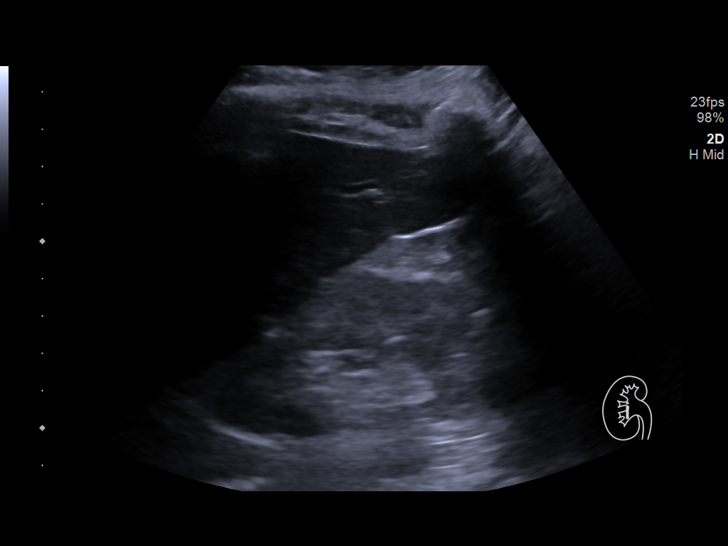
[im 7/24]
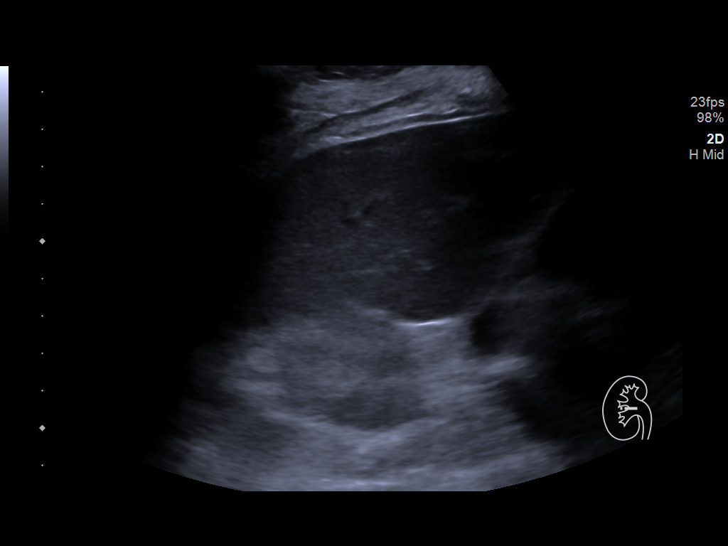
[im 8/24]
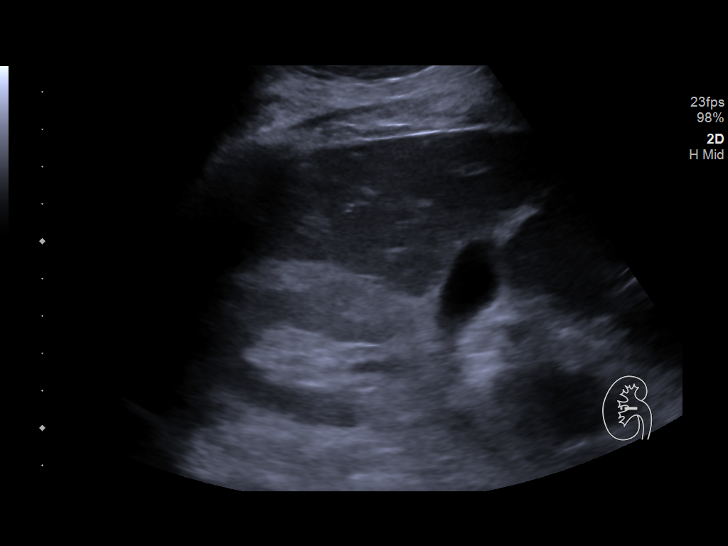
[im 10/24]
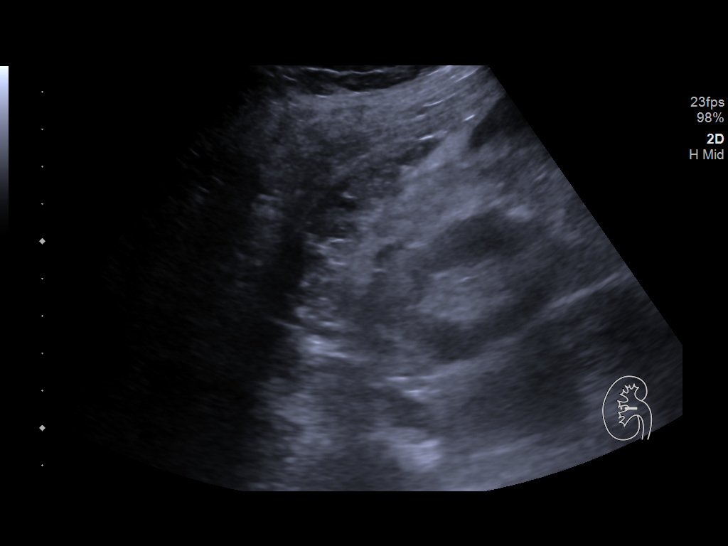
[im 12/24]
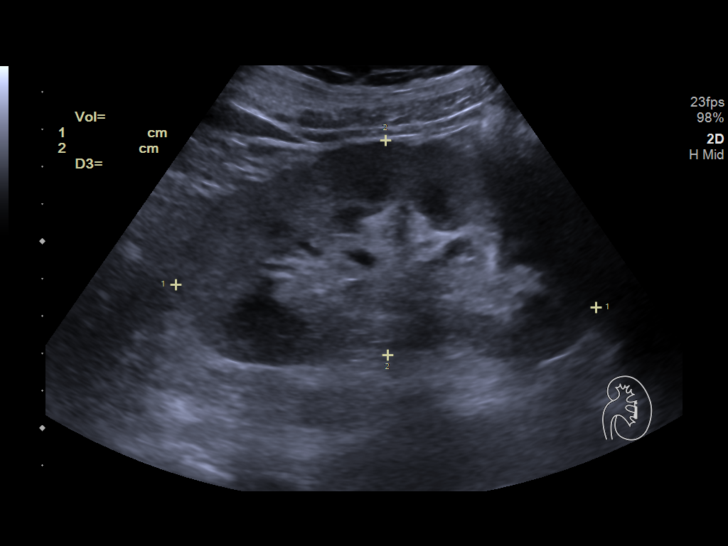
[im 13/24]
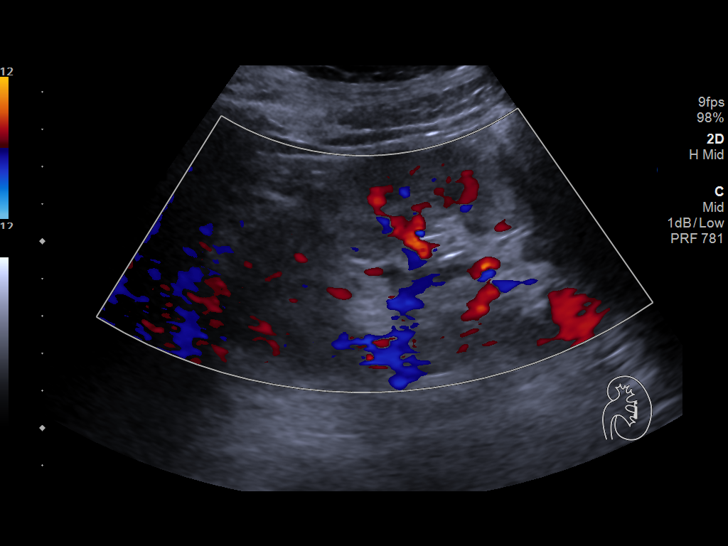
[im 15/24]
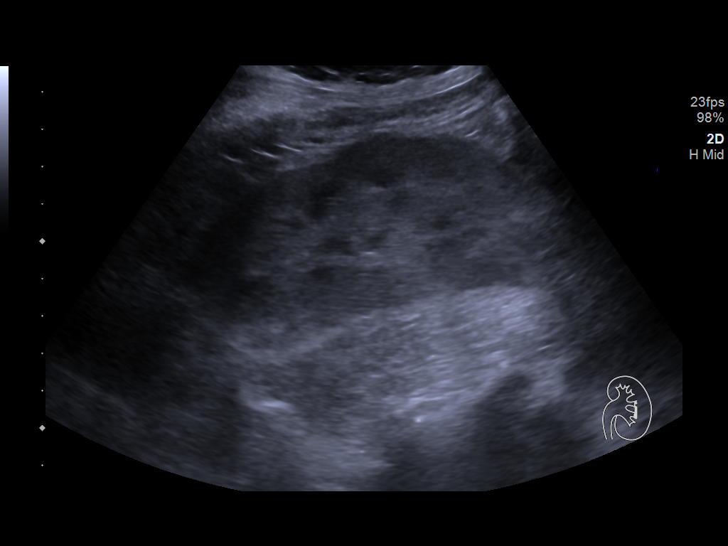
[im 17/24]
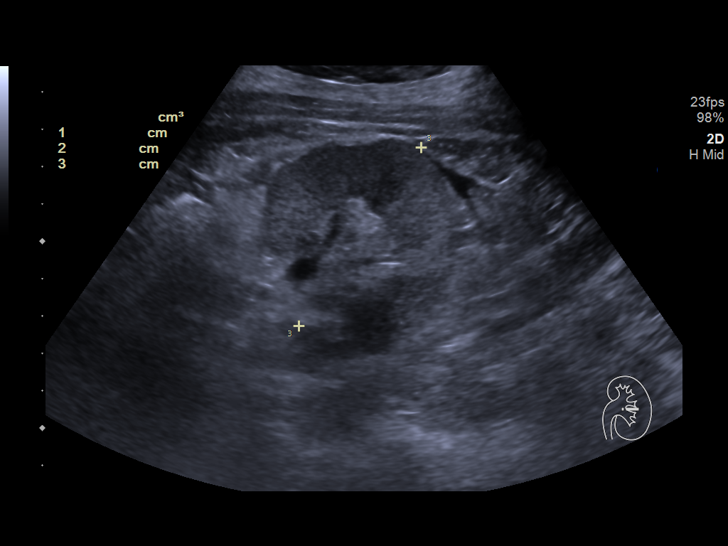
[im 19/24]
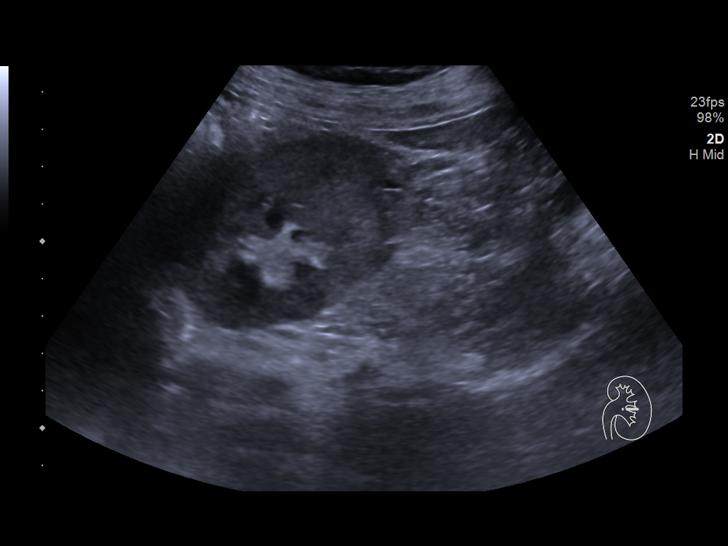
[im 20/24]
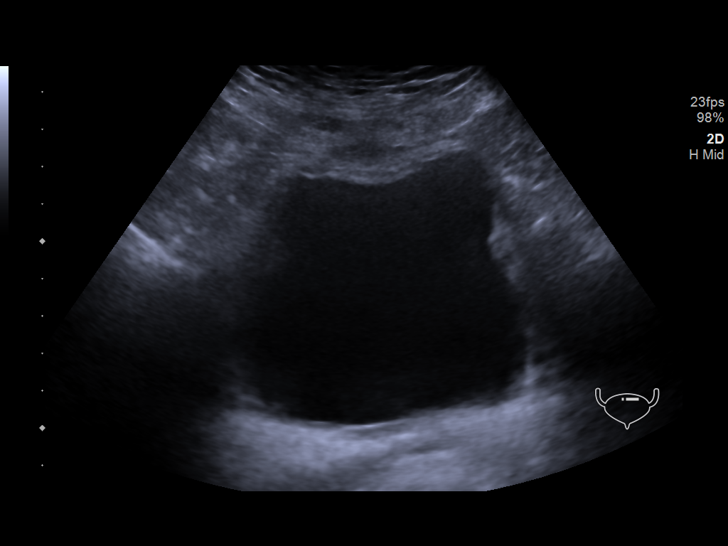
[im 22/24]
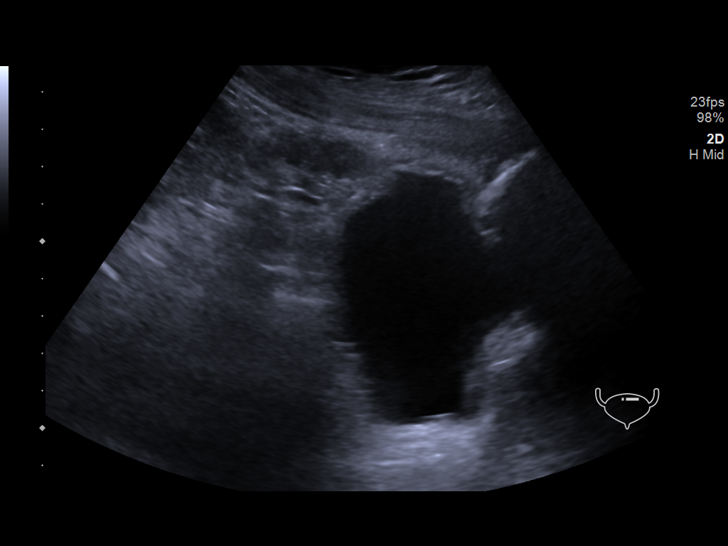
[im 24/24]
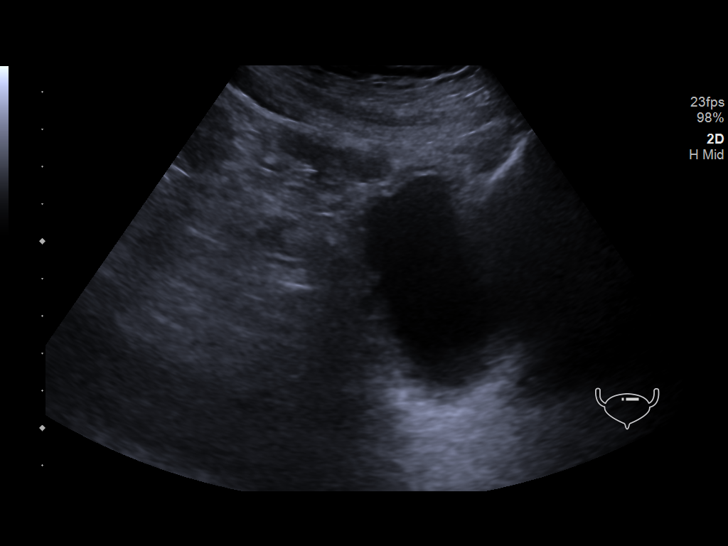

[14 of 24 positions shown; findings below may reference images not displayed]

FINDINGS: Right Kidney:

Renal measurements: 8.6 x 3.9 x 4.7 cm = volume: 82 mL. Increased
renal echogenicity.

Left Kidney:

Renal measurements: 11.3 x 5.6 x 5.8 cm = volume: 196 mL. Increased
renal echogenicity.

Bladder:

Appears normal for degree of bladder distention.

Other:

Trace left perinephric fluid identified. Mild limitations secondary
to patient's shortness of breath.
IMPRESSION: 1. Increased renal echogenicity, suggesting medical renal disease.
Mildly small right kidney relative to the left.
2. Trace left perinephric fluid, likely related to renal
insufficiency.

## 2021-10-16 IMAGING — DX DG CHEST 1V PORT
1 series · 1 of 1 positions shown · non-contrast
Comparison: 12/24/2019 at [DATE] p.m.

CLINICAL DATA: Central line placement

EXAM:
PORTABLE CHEST 1 VIEW

[chest ap]
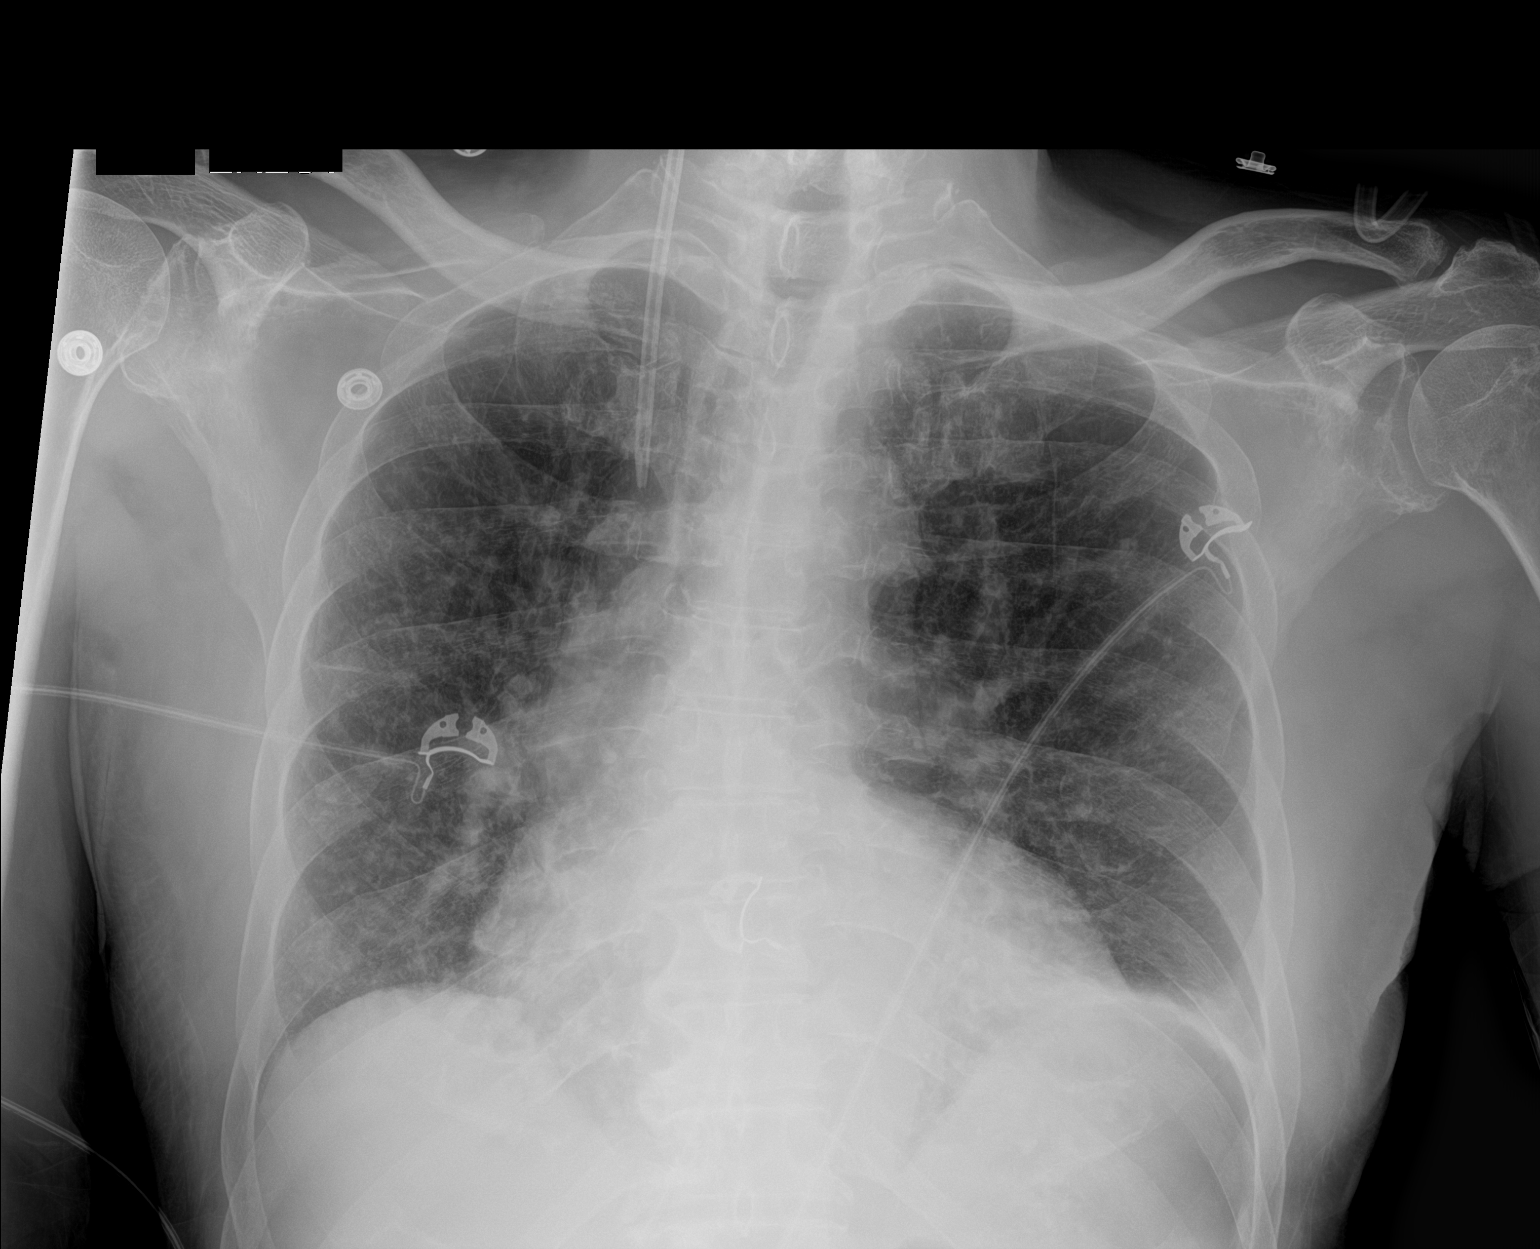

[1 of 1 positions shown; findings below may reference images not displayed]

FINDINGS: [DATE] p.m. Right internal jugular line tip at high SVC, without
pneumothorax. Midline trachea. Borderline cardiomegaly. No pleural
effusion or pneumothorax. Hyperinflation and interstitial
thickening. Mild scarring or subsegmental atelectasis at the left
lung base.
IMPRESSION: Hyperinflation and chronic interstitial thickening, consistent with
COPD/chronic bronchitis.

New right internal jugular line, without pneumothorax.

## 2021-10-16 NOTE — Progress Notes (Deleted)
° °  SUBJECTIVE:   CHIEF COMPLAINT / HPI:   No chief complaint on file.    Kyle [name] is a 70 y.o. male here for ***   Pt reports ***    PERTINENT  PMH / PSH: reviewed and updated as appropriate   OBJECTIVE:   There were no vitals taken for this visit.  ***  ASSESSMENT/PLAN:   No problem-specific Assessment & Plan notes found for this encounter.     Lyndee Hensen, DO PGY-3, [facility] 10/16/2021      {    This will disappear when note is signed, click to select method of visit    :1}

## 2021-10-23 ENCOUNTER — Telehealth: Payer: Self-pay | Admitting: Family Medicine

## 2021-10-23 NOTE — Telephone Encounter (Signed)
Patient's wife dropped off Patient Assistance Program Application. Last DOS was 05/02/21. Placed Camille's box

## 2021-10-23 NOTE — Telephone Encounter (Signed)
Spoke with Kyle [name] up front and she said that she put it in [employer] on the pharm team. Salvatore Marvel, CMA

## 2021-10-24 ENCOUNTER — Telehealth: Payer: Self-pay | Admitting: Cardiology

## 2021-10-24 ENCOUNTER — Other Ambulatory Visit: Payer: Self-pay | Admitting: Family Medicine

## 2021-10-24 ENCOUNTER — Encounter: Payer: Self-pay | Admitting: Family Medicine

## 2021-10-24 MED ORDER — CARVEDILOL 6.25 MG PO TABS: 6.2500 mg | ORAL_TABLET | Freq: Two times a day (BID) | ORAL | 1 refills | Status: DC

## 2021-10-24 NOTE — Telephone Encounter (Signed)
°*  STAT* If patient is at the pharmacy, call can be transferred to refill team.   1. Which medications need to be refilled? (please list name of each medication and dose if known)  carvedilol (COREG) 6.25 MG tablet   2. Which pharmacy/location (including street and city if local pharmacy) is medication to be sent to? [place], [place] - [address]  3. Do they need a 30 day or 90 day supply?  30 day supply  Patient's wife states the patient is completely out of medication.

## 2021-10-27 NOTE — Progress Notes (Deleted)
Cardiology Office Note:    Date:  10/27/2021   ID:  Kyle [name], DOB April 13, 1951, MRN [id number]  PCP:  Lyndee Hensen, [place] Prov[facility] Cardiologist:  Peter Martinique, MD { Click to update primary MD,subspecialty MD or APP then REFRESH:1}    Referring MD: Lyndee Hensen, DO   No chief complaint on file. ***  History of Present Illness:    Kyle [name] is a 71 y.o. male with a hx of PAD, nonischemic cardiomyopathy, chronic systolic heart failure with an EF as low as 20 to 25% in 2012 but improved to 65 to 70% on echo in 11/2019, normal coronary arteries by cardiac catheterization in 2012, paroxysmal atrial fibrillation on Eliquis, bicuspid aortic valve aortic stenosis/insufficiency, severe PAD, hypertension, hyperlipidemia, DM 2, CKD stage III, and former tobacco abuse. He is status post aortobifem bypass graft and left femoral endarterectomy in February 2016.  Complicated by pseudoaneurysm in the right groin that was repaired.  He had right femoral to peroneal artery bypass in 03/2019.  He is followed by [facility].  He has COPD with pulmonary hypertension on chronic O2 followed by pulmonology.    He was last seen by our service during a hospitalization in June 2022 for acute on chronic respiratory failure felt due to untreated COPD and CHF exacerbation.  He was also found to be in new onset atrial flutter with RVR which converted to normal sinus rhythm spontaneously.  Coreg and Eliquis were continued.  He also had nonsustained VT on telemetry with normal electrolytes.  Troponin elevation felt due to demand ischemia in the setting of hypoxic respiratory failure and atrial flutter with RVR.  However, echocardiogram did show new reduced EF with new wall motion abnormality.  Unfortunately given his AKI he was not considered a candidate for angiography or coronary CT; he was not a candidate for [employer] given his COPD exacerbation.     Nonischemic cardiomyopathy Chronic systolic  heart failure - EF noted to be as low as 20 to 25% in 2012 with normal coronaries on angiography - EF improved to 65 to 70%; however on recent echocardiogram EF was down to 45 to 50% with new wall motion abnormality - Ischemic evaluation was not pursued in the setting of acute illness - not a lexiscan myoview candidate given chronic respiratory failure and COPD     Paroxysmal atrial fibrillation Paroxysmal atrial flutter Chronic anticoagulation - continue coreg and eliquis   NSVT, PVCs -In the setting of acute illness/respiratory failure in the hospital   Hypertension -Continue BB   Hyperlipidemia  04/09/2021: Cholesterol 82; HDL 31; LDL Cholesterol 37; Triglycerides 70; VLDL 14 - continue statin   CKD stage III - need recent labs     Past Medical History:  Diagnosis Date   Aortic insufficiency    MODERATE WITH A BICUSPID AORTIC VAVLE   Arterial occlusive disease    MULTILEVEL   CHF (congestive heart failure) (HCC)    COPD (chronic obstructive pulmonary disease) ([place])    Dilated cardiomyopathy ([name])    WITH EJECTION FRACTION DOWN TO 20-25%--WITH CONGESTIVE HEART FAILURE   Edema    LOWER EXTREMETIES   Hyperglycemia 01/2020   Hypertension    Normal coronary arteries 2009   Orthopnea    Peripheral arterial disease ([place])    Pulmonary hypertension ([place])     Past Surgical History:  Procedure Laterality Date   ABDOMINAL AORTAGRAM N/A 11/05/2014   Procedure: ABDOMINAL Maxcine Ham;  Surgeon: Angelia Mould, MD;  Location:  [facility];  Service: Cardiovascular;  Laterality: N/A;   ABDOMINAL AORTOGRAM W/LOWER EXTREMITY Bilateral 04/21/2019   Procedure: ABDOMINAL AORTOGRAM W/LOWER EXTREMITY;  Surgeon: Angelia Mould, MD;  Location: [facility];  Service: Cardiovascular;  Laterality: Bilateral;   ABDOMINAL AORTOGRAM W/LOWER EXTREMITY Right 05/30/2021   Procedure: ABDOMINAL AORTOGRAM W/LOWER EXTREMITY;  Surgeon: Angelia Mould, MD;  Location: [facility] CV [facility];  Service: Cardiovascular;  Laterality: Right;   AORTA - BILATERAL FEMORAL ARTERY BYPASS GRAFT N/A 12/18/2014   Procedure: AORTOBIFEMORAL BYPASS GRAFT;  Surgeon: Angelia Mould, MD;  Location: [facility];  Service: Vascular;  Laterality: N/A;   CARDIAC CATHETERIZATION  2009   Nl Cors, EF 20%   COLONOSCOPY     ENDARTERECTOMY FEMORAL Left 12/18/2014   Procedure: ENDARTERECTOMY FEMORAL;  Surgeon: Angelia Mould, MD;  Location: [facility];  Service: Vascular;  Laterality: Left;   FALSE ANEURYSM REPAIR Right 04/24/2019   Procedure: REPAIR OF RIGHT FEMORAL ARTERY PSEUDOANEURYSM;  Surgeon: Rosetta Posner, MD;  Location: [facility];  Service: Vascular;  Laterality: Right;   FEMORAL-POPLITEAL BYPASS GRAFT  02/21/2011   right   FEMORAL-TIBIAL BYPASS GRAFT Right 04/24/2019   Procedure: REDO BYPASS GRAFT RIGHT FEMORAL-TIBIAL ARTERY USING LEFT LEG VEIN & HEMASHIELD GOLD 69mm GRAFT;  Surgeon: Rosetta Posner, MD;  Location: [facility];  Service: Vascular;  Laterality: Right;   IR FLUORO GUIDE CV LINE RIGHT  12/28/2019   IR REMOVAL TUN CV CATH W/O FL  01/11/2020   IR US GUIDE VASC ACCESS RIGHT  12/28/2019   OTHER SURGICAL HISTORY     POST RIGHT FEMOROPOPLITEAL BYPASS GRAFT   PERIPHERAL VASCULAR CATHETERIZATION  11/05/2014   Procedure: LOWER EXTREMITY ANGIOGRAPHY;  Surgeon: Angelia Mould, MD;  Location: [facility] CATH [facility];  Service: Cardiovascular;;   VEIN HARVEST Left 04/24/2019   Procedure: LEFT LEG SAPHENOUS VEIN HARVEST;  Surgeon: Rosetta Posner, MD;  Location: [facility];  Service: Vascular;  Laterality: Left;    Current Medications: No outpatient medications have been marked as taking for the 10/28/21 encounter (Appointment) with Kyle [name], [place].     Allergies:   Patient has no known allergies.   Social History   Socioeconomic History   Marital status: Married    Spouse name: Not on file   Number of children: 1   Years of education: Not on file   Highest education level: Not on file   Occupational History   Occupation: retired    Fish farm manager: [employer]  Tobacco Use   Smoking status: Former    Packs/day: 1.00    Years: 40.00    Pack years: 40.00    Types: Cigarettes    Quit date: 10/15/2018    Years since quitting: 3.0   Smokeless tobacco: Never   Tobacco comments:    quit 2010 and quit again 2019  Vaping Use   Vaping Use: Never used  Substance and Sexual Activity   Alcohol use: No    Alcohol/week: 0.0 standard drinks   Drug use: No   Sexual activity: Not on file  Other Topics Concern   Not on file  Social History Narrative   Not on file   Social Determinants of Health   Financial Resource Strain: Not on file  Food Insecurity: Not on file  Transportation Needs: Not on file  Physical Activity: Not on file  Stress: Not on file  Social Connections: Not on file     Family History: The patient's ***family history includes  Diabetes in his mother; Hyperlipidemia in his sister; Hypertension in his sister.  ROS:   Please see the history of present illness.    *** All other systems reviewed and are negative.  EKGs/Labs/Other Studies Reviewed:    The following studies were reviewed today: ***  EKG:  EKG is *** ordered today.  The ekg ordered today demonstrates ***  Recent Labs: 04/07/2021: ALT 41; B Natriuretic Peptide 1,899.5 04/08/2021: Magnesium 2.1; TSH 0.382 05/06/2021: Platelets 196 05/30/2021: BUN 27; Creatinine, Ser 1.80; Hemoglobin 16.0; Potassium 3.8; Sodium 143  Recent Lipid Panel    Component Value Date/Time   CHOL 82 04/09/2021 0317   CHOL 166 11/30/2019 0937   TRIG 70 04/09/2021 0317   HDL 31 (L) 04/09/2021 0317   HDL 41 11/30/2019 0937   CHOLHDL 2.6 04/09/2021 0317   VLDL 14 04/09/2021 0317   LDLCALC 37 04/09/2021 0317   LDLCALC 101 (H) 11/30/2019 0937     Risk Assessment/Calculations:   {Does this patient have ATRIAL FIBRILLATION?[phone]}       Physical Exam:    VS:  There were no vitals taken for this visit.     Wt Readings from Last 3 Encounters:  10/14/21 172 lb 12.8 oz (78.4 kg)  10/09/21 176 lb (79.8 kg)  05/30/21 163 lb (73.9 kg)     GEN: *** Well nourished, well developed in no acute distress HEENT: Normal NECK: No JVD; No carotid bruits LYMPHATICS: No lymphadenopathy CARDIAC: ***RRR, no murmurs, rubs, gallops RESPIRATORY:  Clear to auscultation without rales, wheezing or rhonchi  ABDOMEN: Soft, non-tender, non-distended MUSCULOSKELETAL:  No edema; No deformity  SKIN: Warm and dry NEUROLOGIC:  Alert and oriented x 3 PSYCHIATRIC:  Normal affect   ASSESSMENT:    No diagnosis found. PLAN:    In order of problems listed above:  ***      {Are you ordering a CV Procedure (e.g. stress test, cath, DCCV, TEE, etc)?   Press F2        :[id number]}    Medication Adjustments/Labs and Tests Ordered: Current medicines are reviewed at length with the patient today.  Concerns regarding medicines are outlined above.  No orders of the defined types were placed in this encounter.  No orders of the defined types were placed in this encounter.   There are no Patient Instructions on file for this visit.   Signed, Kyle Bottcher, PA  10/27/2021 10:14 PM    [facility]

## 2021-10-28 ENCOUNTER — Other Ambulatory Visit: Payer: Self-pay

## 2021-10-28 ENCOUNTER — Ambulatory Visit: Payer: Medicare HMO | Admitting: Physician Assistant

## 2021-10-28 MED ORDER — ROSUVASTATIN CALCIUM 10 MG PO TABS: 10.0000 mg | ORAL_TABLET | Freq: Every day | ORAL | 0 refills | Status: DC

## 2021-10-29 ENCOUNTER — Other Ambulatory Visit (HOSPITAL_COMMUNITY): Payer: Self-pay

## 2021-10-29 NOTE — Progress Notes (Signed)
Received notification from [place] regarding patient assistance RE-ENROLLMENT [place] for TRELEGY.   PT NEEDS TO SPEND $600 ON OUT OF POCKET RX/MEDICAL EXPENSES BEFORE QUALIFYING FOR 2023.  Ran a test claim through [employer]. Pt's copay is $45.  Phone:[phone]

## 2021-10-30 ENCOUNTER — Other Ambulatory Visit: Payer: Self-pay | Admitting: *Deleted

## 2021-10-30 MED ORDER — INSULIN PEN NEEDLE 32G X 4 MM MISC: 1 refills | Status: DC

## 2021-10-30 MED ORDER — ACCU-CHEK GUIDE VI STRP: ORAL_STRIP | 1 refills | Status: DC

## 2021-10-31 ENCOUNTER — Telehealth: Payer: Self-pay

## 2021-10-31 NOTE — Telephone Encounter (Signed)
Left message requesting call back regarding PAP denial.  Call back: [phone]  Samples of trelegy inh available for pickup. Labeled and ready in med room

## 2021-11-01 MED ORDER — LEVETIRACETAM 500 MG PO TABS: 500.0000 mg | ORAL_TABLET | Freq: Every morning | ORAL | 0 refills | Status: DC

## 2021-11-01 MED ORDER — HYDRALAZINE HCL 25 MG PO TABS: 25.0000 mg | ORAL_TABLET | Freq: Three times a day (TID) | ORAL | 0 refills | Status: DC

## 2021-11-04 ENCOUNTER — Telehealth: Payer: Self-pay | Admitting: Cardiology

## 2021-11-04 NOTE — Telephone Encounter (Addendum)
Request for samples.   Indication: afib  Age: 71 yo  Weight:78.4 kg Scr: 1.9, 05/30/2021 OV: [place], 12/02/2020  Per dosing criteria, pt qualifies for Eliquis 5mg  BID.

## 2021-11-04 NOTE — Telephone Encounter (Signed)
Patient calling the office for samples of medication:   1.  What medication and dosage are you requesting samples for?apixaban (ELIQUIS) 5 MG TABS tablet  2.  Are you currently out of this medication? Patient is out of medication.

## 2021-11-04 NOTE — Telephone Encounter (Signed)
Patient presents to clinic to pick up samples. Provided with labeled samples, per Rosendo Gros.   Talbot Grumbling, RN

## 2021-11-04 NOTE — Telephone Encounter (Signed)
As I am remote, I will forward to [facility] to assist patient in getting samples of Eliquis.

## 2021-11-04 NOTE — Telephone Encounter (Signed)
Called patient left message on personal voice mail Eliquis 5 mg samples left at front desk.

## 2021-11-11 ENCOUNTER — Other Ambulatory Visit: Payer: Self-pay

## 2021-11-11 MED ORDER — CARVEDILOL 6.25 MG PO TABS: 6.2500 mg | ORAL_TABLET | Freq: Two times a day (BID) | ORAL | 0 refills | Status: DC

## 2021-11-11 MED ORDER — APIXABAN 5 MG PO TABS: 5.0000 mg | ORAL_TABLET | Freq: Two times a day (BID) | ORAL | 0 refills | Status: DC

## 2021-11-12 ENCOUNTER — Ambulatory Visit: Payer: Medicare HMO | Admitting: Podiatry

## 2021-11-12 IMAGING — DX DG CHEST 1V PORT
1 series · 1 of 1 positions shown · non-contrast
Comparison: January 07, 2020

CLINICAL DATA: Shortness of breath since [REDACTED]

EXAM:
PORTABLE CHEST 1 VIEW

[chest ap]
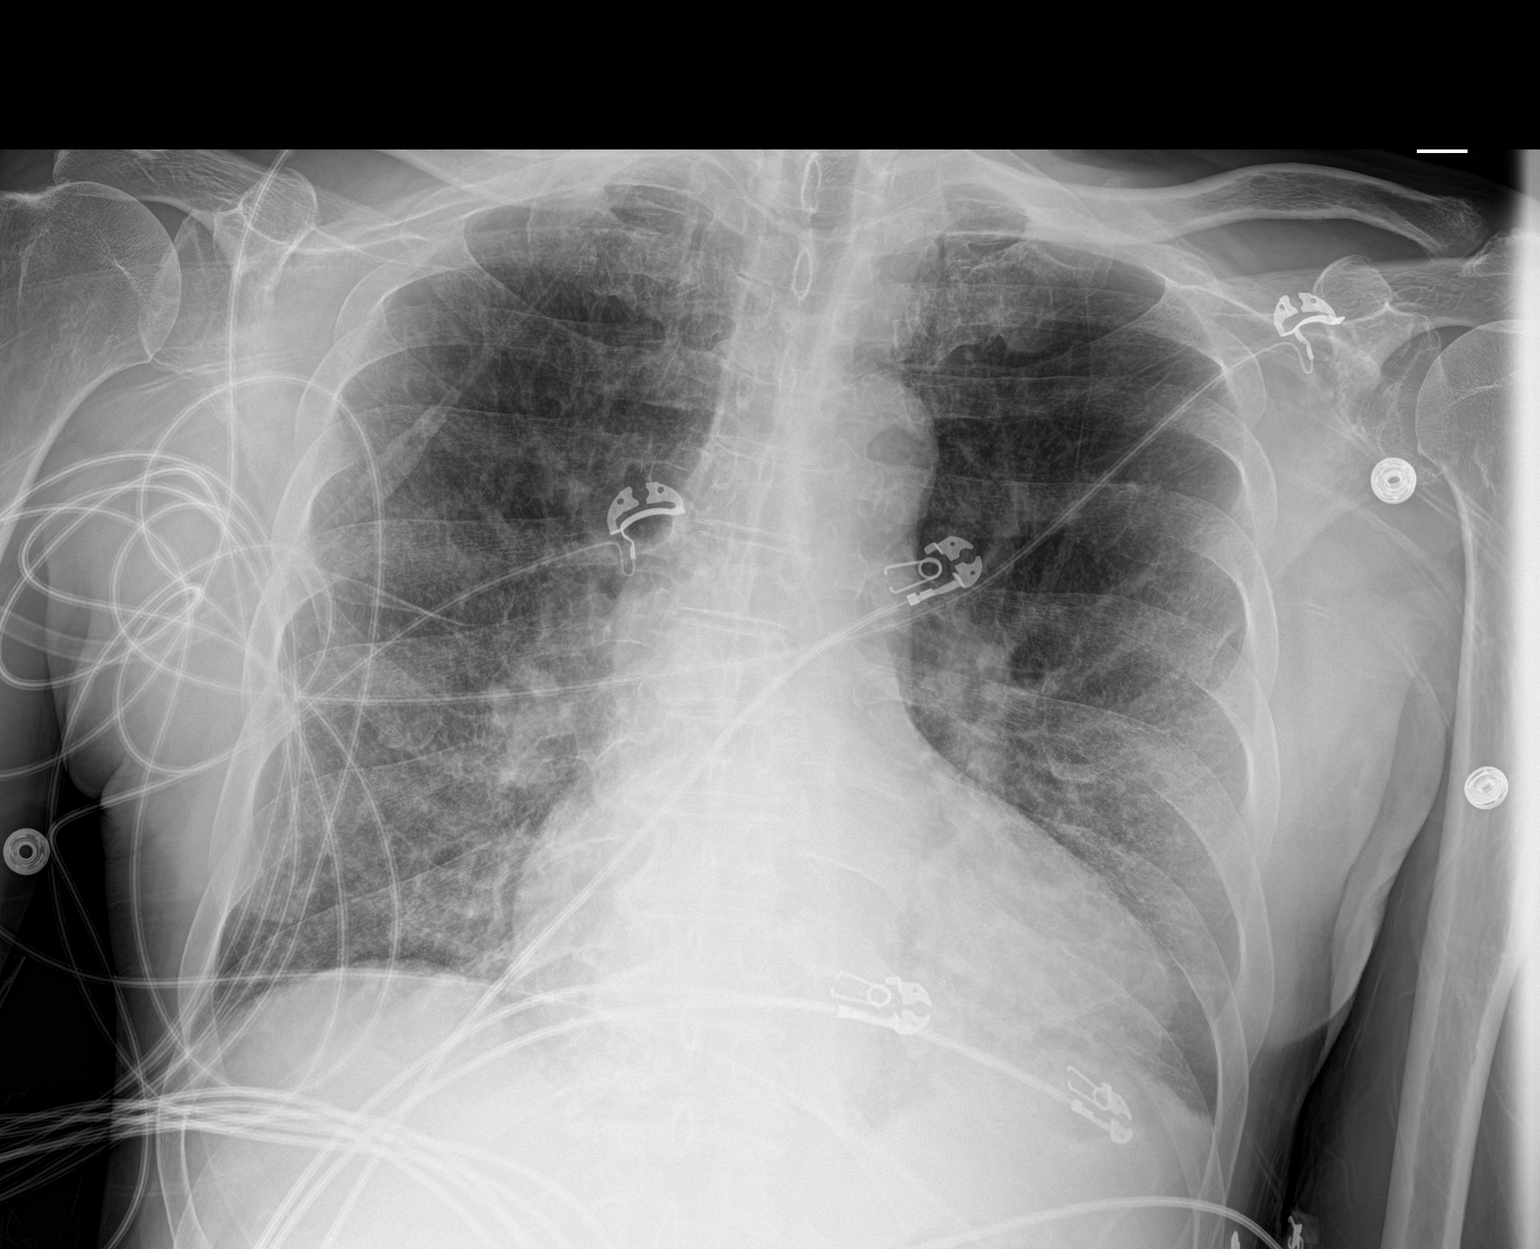

[1 of 1 positions shown; findings below may reference images not displayed]

FINDINGS: The mediastinal contour is normal. The heart size is enlarged. Mild
increased pulmonary interstitium is identified bilaterally. Lungs
are hyperinflated. The bony structures are stable.
IMPRESSION: Mild congestive heart failure.

## 2021-11-13 IMAGING — DX DG CHEST 1V PORT
1 series · 1 of 1 positions shown · non-contrast
Comparison: 01/20/2020

CLINICAL DATA: COPD, ischemic cardiomyopathy

EXAM:
PORTABLE CHEST 1 VIEW

[chest ap]
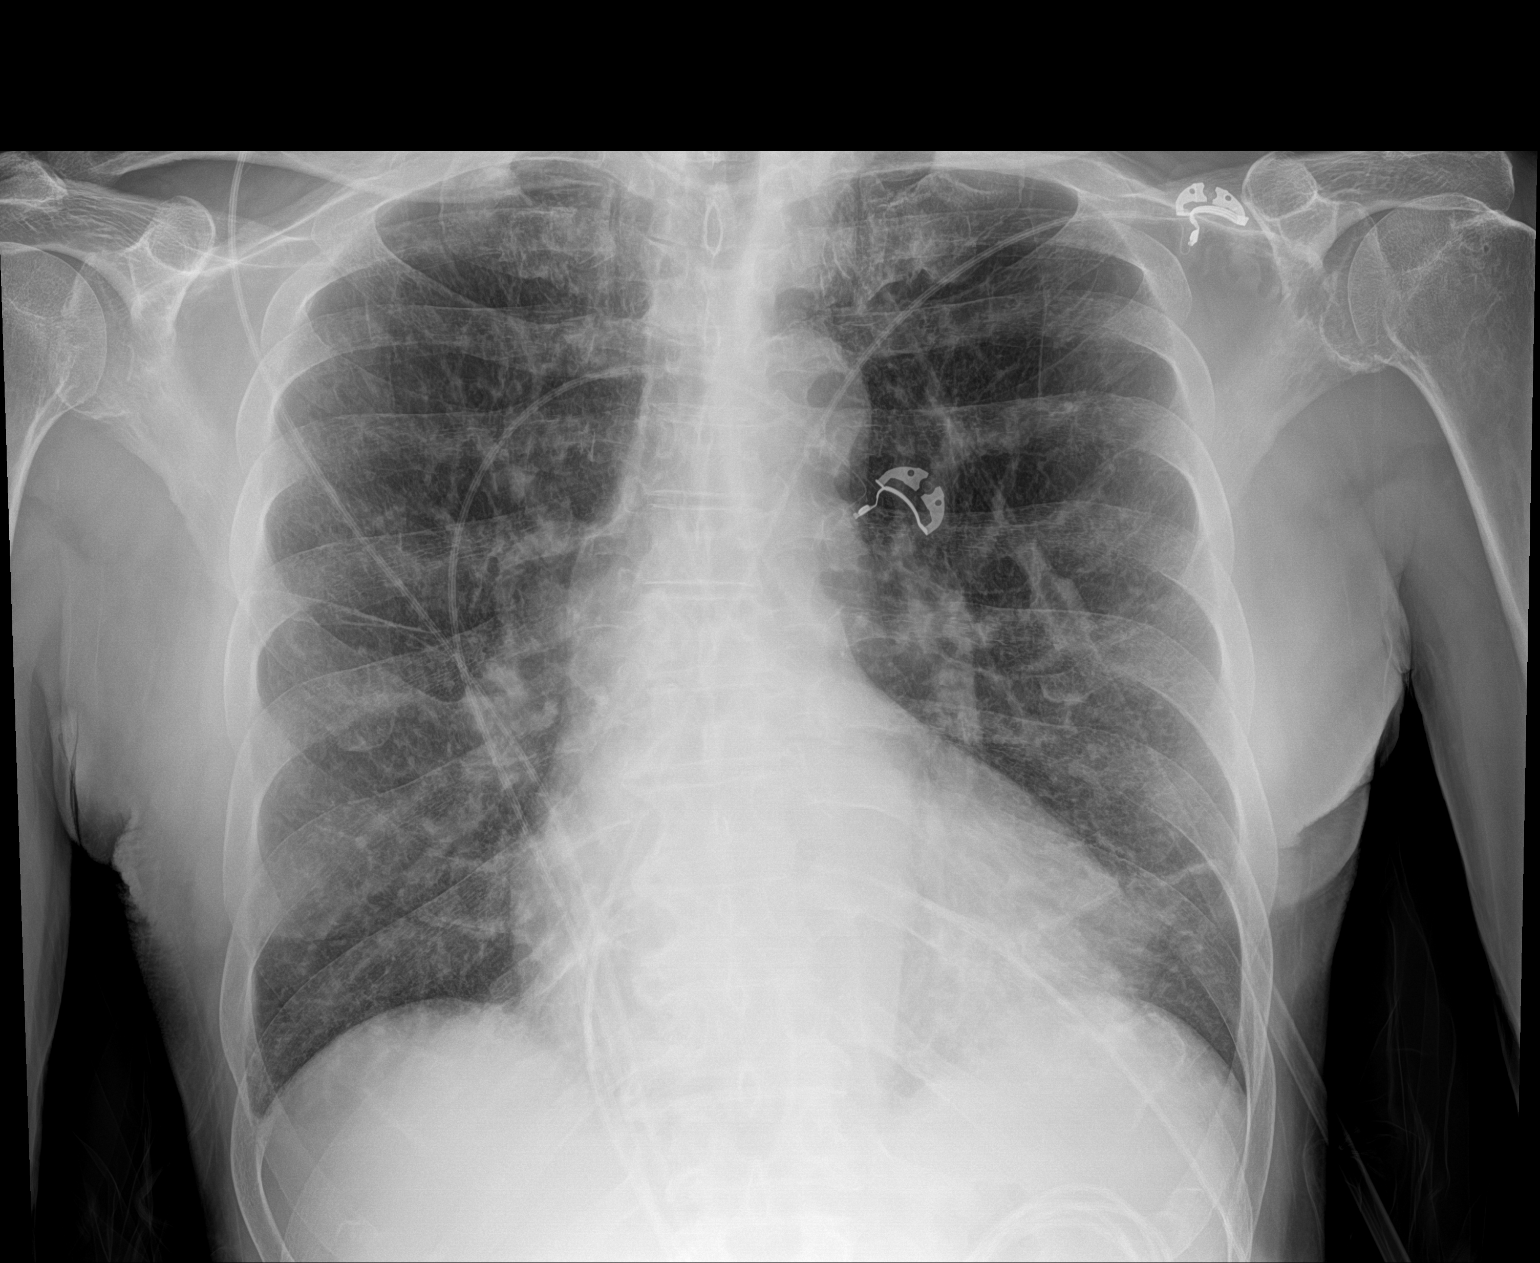

[1 of 1 positions shown; findings below may reference images not displayed]

FINDINGS: Bilateral diffuse interstitial thickening. No pleural effusion or
pneumothorax. Stable cardiomediastinal silhouette. No acute osseous
abnormality.
IMPRESSION: Mild CHF.

## 2021-11-15 IMAGING — DX DG CHEST 1V PORT
1 series · 1 of 1 positions shown · non-contrast
Comparison: Portable exam 8583 hours compared to 01/22/2020

CLINICAL DATA: Hypoxia, heart failure exacerbation, history COPD,
dilated cardiomyopathy, CHF, hypertension

EXAM:
PORTABLE CHEST 1 VIEW

[chest]
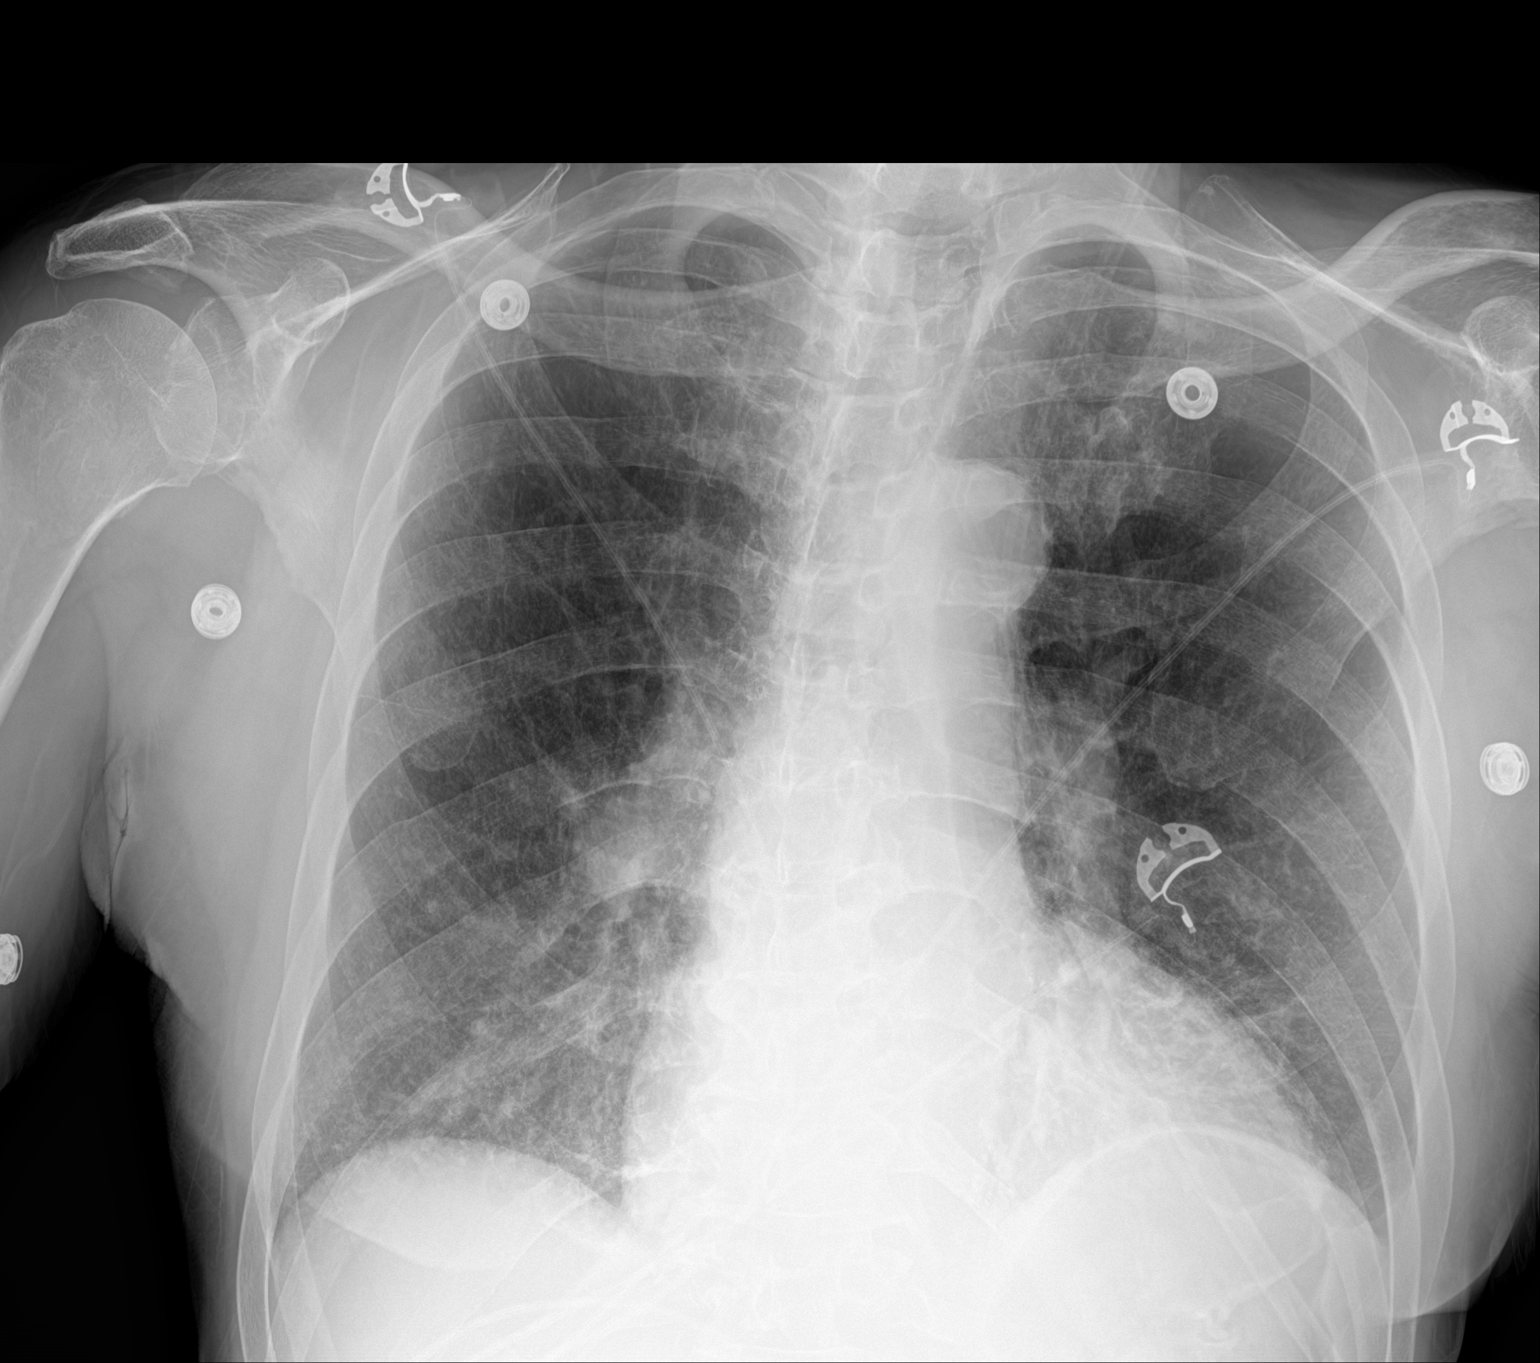

[1 of 1 positions shown; findings below may reference images not displayed]

FINDINGS: Enlargement of cardiac silhouette.

Atherosclerotic calcification aorta.

Emphysematous changes with mild interstitial infiltrates at the
lower lungs, slightly improved since previous exam.

Mild LEFT basilar atelectasis.

No pleural effusion or pneumothorax.

Bones demineralized.
IMPRESSION: Emphysematous changes with decreased interstitial infiltrates.

Enlargement of cardiac silhouette with mild persistent LEFT basilar
atelectasis.

## 2021-11-26 ENCOUNTER — Other Ambulatory Visit: Payer: Self-pay | Admitting: Family Medicine

## 2021-12-02 ENCOUNTER — Other Ambulatory Visit: Payer: Self-pay | Admitting: Family Medicine

## 2021-12-03 NOTE — Progress Notes (Signed)
Received notification from [place] regarding approval for Kyle [name]. Patient assistance approved until 09/24/22.  Shipments processing about 6 weeks out. Should be back to 10-14 business days in March 2023  Phone: [phone]

## 2021-12-10 ENCOUNTER — Telehealth: Payer: Self-pay

## 2021-12-10 NOTE — Telephone Encounter (Signed)
[name] called to f/u on novolog flexpen shipment since Kyle [name] is almost out of medication. Informed her that shipments are about 6 weeks out at the  moment and should return to regular shipment time around the end of march. Will provide a sample for pt in meantime.   Medication Samples have been provided to the patient.  Drug name: NOVOLOG FLEXPEN       Strength: U-100        Qty: 1 BOX (5 PENS)  LOT: [id number]  Exp.Date: 08/26/2023  Dosing instructions: INJECT 5 UNITS THREE TIMES DAILY  The patient has been instructed regarding the correct time, dose, and frequency of taking this medication.  [name] [name] 11:14 AM 12/10/2021

## 2021-12-11 NOTE — Telephone Encounter (Signed)
CORRECTION: sample used from internal Med supply. Documentation explanation/corrected in sample book at [facility].

## 2021-12-12 ENCOUNTER — Other Ambulatory Visit: Payer: Self-pay

## 2021-12-12 MED ORDER — INSULIN PEN NEEDLE 32G X 4 MM MISC: 1 refills | Status: DC

## 2021-12-12 NOTE — Telephone Encounter (Signed)
Patient presents to clinic to pick up sample. Provided sample per note from [facility].   Talbot Grumbling, RN

## 2021-12-31 ENCOUNTER — Other Ambulatory Visit: Payer: Self-pay | Admitting: Cardiology

## 2022-01-10 ENCOUNTER — Other Ambulatory Visit: Payer: Self-pay | Admitting: Family Medicine

## 2022-01-13 ENCOUNTER — Telehealth: Payer: Self-pay

## 2022-01-13 ENCOUNTER — Ambulatory Visit: Payer: Medicare HMO | Admitting: Pulmonary Disease

## 2022-01-13 NOTE — Telephone Encounter (Signed)
Left message informing pt that one of their novo nordisk medications are ready for pickup. ?

## 2022-01-13 NOTE — Telephone Encounter (Signed)
Patient has 2 novo nordisk medications labeled and ready in med room fridge (levemir & novolog) ?

## 2022-01-15 ENCOUNTER — Other Ambulatory Visit: Payer: Self-pay | Admitting: Family Medicine

## 2022-01-15 ENCOUNTER — Other Ambulatory Visit: Payer: Self-pay | Admitting: Vascular Surgery

## 2022-01-17 ENCOUNTER — Other Ambulatory Visit: Payer: Self-pay | Admitting: Family Medicine

## 2022-01-23 ENCOUNTER — Other Ambulatory Visit: Payer: Self-pay | Admitting: Family Medicine

## 2022-01-29 ENCOUNTER — Ambulatory Visit: Payer: Medicare HMO | Admitting: Pulmonary Disease

## 2022-01-29 ENCOUNTER — Encounter: Payer: Self-pay | Admitting: Pulmonary Disease

## 2022-01-29 DIAGNOSIS — J449 Chronic obstructive pulmonary disease, unspecified: Secondary | ICD-10-CM | POA: Diagnosis not present

## 2022-01-29 DIAGNOSIS — I5022 Chronic systolic (congestive) heart failure: Secondary | ICD-10-CM | POA: Diagnosis not present

## 2022-01-29 DIAGNOSIS — J9611 Chronic respiratory failure with hypoxia: Secondary | ICD-10-CM | POA: Diagnosis not present

## 2022-01-29 DIAGNOSIS — I272 Pulmonary hypertension, unspecified: Secondary | ICD-10-CM

## 2022-01-29 NOTE — Assessment & Plan Note (Signed)
Continue Trelegy. ?Discussed COPD action plan. ?Discussed use of albuterol for rescue ?

## 2022-01-29 NOTE — Assessment & Plan Note (Signed)
He desaturated on second lap on ambulation and will qualify for oxygen. ?He already has POC ?

## 2022-01-29 NOTE — Progress Notes (Signed)
? ?  Subjective:  ? ? Patient ID: Kyle [name], male    DOB: Nov 09, 1950, 71 y.o.   MRN: [id number] ? ?HPI ? ?71 yo ex smoker for follow-up of emphysema and chronic respiratory failure on oxygen ?PMH - cardiomyopathy, HFrEF ?PAD s/p right femoral peroneal bypass  ?Quit 2019, 40 Pyrs ? ?Chief Complaint  ?Patient presents with  ? Follow-up  ?  3 month follow up. Pt states breathing is good during the day. Pt states at night he feels like he is running out of air. He wears o2 at night.   ? ?He arrives without his POC today, saturation 94% on room air.  Has left his POC in the car ?Reports breathing is slightly worse, only takes his fluid pills as needed. ?Reports bipedal edema ?Compliant with Trelegy, denies coughing or wheezing ?Weight is actually down from 172 to 164 pounds ? ? ? ?Significant tests/ events reviewed ? ?LDCT 04/2021 RADS2 - RLL nodule unchanged ?  ?LDCT 02/2020 - lung RADS 2.  Moderate to advanced changes of emphysema.  And tiny nodule in the left base at 4.7 mm. ?  ?11/20/2018-CT head without contrast- sinuses normal ?  ?12/16/2018-spirometry- FVC 1.7 (43% predicted), ratio 64, FEV1 1.1 (36% predicted), consistent with severe airway obstruction ?  ?  ?03/2021 echo LVEF 45 to 50%, RVSP 55 ?11/21/2018-echocardiogram-LV ejection fraction 40 to 45%, diffuse hypokinesis, right ventricle moderately dilated, systolic function was moderately reduced, PA P pressure 53 ? ?Review of Systems ?neg for any significant sore throat, dysphagia, itching, sneezing, nasal congestion or excess/ purulent secretions, fever, chills, sweats, unintended wt loss, pleuritic or exertional cp, hempoptysis, orthopnea pnd or change in chronic leg swelling. Also denies presyncope, palpitations, heartburn, abdominal pain, nausea, vomiting, diarrhea or change in bowel or urinary habits, dysuria,hematuria, rash, arthralgias, visual complaints, headache, numbness weakness or ataxia. ? ?   ?Objective:  ? Physical Exam ? ?Gen. Pleasant,  well-nourished, in no distress ?ENT - no thrush, no pallor/icterus,no post nasal drip ?Neck: No JVD, no thyromegaly, no carotid bruits ?Lungs: no use of accessory muscles, no dullness to percussion, clear without rales or rhonchi  ?Cardiovascular: Rhythm regular, heart sounds  normal, no murmurs or gallops, 2+ peripheral edema ?Musculoskeletal: No deformities, no cyanosis or clubbing  ? ? ? ? ?   ?Assessment & Plan:  ? ? ?

## 2022-01-29 NOTE — Assessment & Plan Note (Signed)
WHO 2/3 ?We will need optimization of cardiac status ?Emphasize oxygen use ?

## 2022-01-29 NOTE — Assessment & Plan Note (Signed)
Emphasize use of diuretic daily rather than using as needed.  He has significant bipedal edema and is at risk for hospitalization. ?He will go home and take his Lasix today ?

## 2022-01-29 NOTE — Patient Instructions (Signed)
?  Get back on lasix ? ?X Ambulatory sat for POC ? ?Continue on trelegy ?

## 2022-02-07 NOTE — Progress Notes (Signed)
? ?Kyle [name] ?Date of Birth: Mar 21, 1951 ?Medical Record #[id number] ? ?History of Present Illness: ?Kyle [name] is seen for follow up CHF.  Last seen in February 2022. He has a history of systolic CHF with EF of 44-01% in May 2012. Coronary anatomy was normal by cath. He has a bicuspid AV with moderate AS/AI. He had moderate pulmonary HTN. He was treated medically and one of my old notes stated that his LV function showed marked improvement with  Echo in 2011 showing improvement in EF to 55-60%.  ? ?Unfortunately , he was lost to follow up and was not taking medication for over 2 years. He also has a history of HTN, hyperlipidemia, and PAD s/p right fem-pop BPG.  ? ?When seen in October 2015 he had evidence of recurrent CHF with severe HTN. Echo showed an EF of 20-25% with moderate AS/AI. He was started back on medication with lasix, carvedilol, and quinapril. He did not come back for follow up as instructed. He was seen in Jan. 2016 for surgical clearance for vascular surgery.  He was seen by [name] for claudication and was found to have severe PAD. Revascularization recommended. He underwent aortobifemoral BPG in February 2016. Was seen by me in April 2019- had missed multiple appointments. He did have an Echo in July 2018 showing normalization of LV function with EF 55%.  He is  diabetic and on insulin. ? ?He was admitted in January 2020 with hypotension and shock. Had malaise, N/V and sweats. Treated with antibiotics. Had respiratory failure with hypoxia. Sent home with oxygen. He had Afib initially then converted to NSR. Started on Eliquis. Continued on Coreg. Had AKI with creatinine up to 2.7 that improved to 1.03. LFTs were elevated. Echo showed EF 40-45%. There was RV dysfunction with moderate pulmonary HTN. Moderate AI with mild AS. CT negative for PE but showed advanced emphysema.  ? ?He presented in June 2020 with progressive right leg pain and nonhealing wound on his foot. Angiography showed  Occluded graft to the right leg and only peroneal run off. He underwent the following surgery on 04/24/19 by Dr Donnetta Hutching. Right femoral and repair with interposition 8 mm Hemashield graft from prior right limb of aortofemoral bypass into into deep femoral artery, #2 right femoral to peroneal bypass with translocated non-reversed great saphenous vein harvested from the left leg. He did not follow up with vascular surgery or cardiology post surgery. Has only followed up with pulmonary for COPD with hypoxemia and pulmonary HTN.  ? ?He was admitted 2/28-3/18/21 with abdominal pain, N/V, and diarrhea. He had acute renal failure and lactic acidosis of unclear etiology. He required mechanical ventilation for one day and intermittent dialysis but was not on dialysis at discharge. His acidosis cleared. He had encephalopathy. Cranial CT and MRI were negative. CT abd/pelvis was negative. He was switched from Eliquis to Coumadin at DC due to AKI. Lasix was held. ? ?He was readmitted 3/27-3/31/21 with acute CHF. He responded to IV diuresis. He was DC on lasix 20 mg bid. DC creatinine was 2.52.  ? ?He was admitted again 4/26-4/30/21 with acute hyperglycemic hyperosmolar state.  Last creatinine 1.74. lasix was discontinued at discharge. He was seen by me in September 2021 and was doing well at that time.  ? ?Since his last visit he had repeat Echo in June 2022 showing EF 45-50%. Moderate AI. ? ?He was seen by Dr Scot Dock and had repeat angiogram in August 2022 showing patent bypass grafts.  ? ?  On follow up today he is feeling very well. Only gets SOB when he rushes. No chest pain. No claudication. Denies any ankle swelling. Weight is down 12 lbs since Dec. Only taking lasix 20 mg daily. Is compliant with meds. Not smoking.  ? ? ?Allergies as of 02/12/2022   ?No Known Allergies ?  ? ?  ?Medication List  ?  ? ?  ? Accurate as of February 12, 2022  9:18 AM. If you have any questions, ask your nurse or doctor.  ?  ?  ? ?  ? ?Accu-Chek FastClix  Lancets Misc ?  ?Accu-Chek Guide test strip ?Generic drug: glucose blood ?TEST BLOOD SUGAR TWICE DAILY ?  ?albuterol 108 (90 Base) MCG/ACT inhaler ?Commonly known as: VENTOLIN HFA ?Inhale 2 puffs into the lungs every 6 (six) hours as needed for wheezing or shortness of breath. ?  ?apixaban 5 MG Tabs tablet ?Commonly known as: Eliquis ?Take 1 tablet (5 mg total) by mouth 2 (two) times daily. ?  ?carvedilol 6.25 MG tablet ?Commonly known as: COREG ?TAKE 1 TABLET BY MOUTH TWICE DAILY WITH A MEAL ?  ?Centrum Silver 50+Men Tabs ?Take 1 tablet by mouth daily with breakfast. ?  ?furosemide 40 MG tablet ?Commonly known as: Lasix ?Take 1 tablet (40 mg total) by mouth daily. ?  ?guaiFENesin 600 MG 12 hr tablet ?Commonly known as: [place] ?Take 2 tablets (1,200 mg total) by mouth 2 (two) times daily. ?What changed: how much to take ?  ?hydrALAZINE 25 MG tablet ?Commonly known as: APRESOLINE ?TAKE 1 TABLET BY MOUTH EVERY 8 HOURS ?  ?insulin aspart 100 UNIT/ML FlexPen ?Commonly known as: NOVOLOG ?Inject 5 Units into the skin 3 (three) times daily with meals. ?  ?insulin detemir 100 unit/ml Soln ?Commonly known as: LEVEMIR ?Inject 28 Units into the skin in the morning. ?  ?Insulin Pen Needle 32G X 4 MM Misc ?Use as directed with insulin pen ?Strength: 32G X 4 MM ?  ?isosorbide mononitrate 30 MG 24 hr tablet ?Commonly known as: IMDUR ?TAKE 1 TABLET BY MOUTH ONCE DAILY . APPOINTMENT REQUIRED FOR FUTURE REFILLS WITH  CARDIOLOGIST ?  ?levETIRAcetam 500 MG tablet ?Commonly known as: KEPPRA ?TAKE 1 TABLET (500 MG TOTAL) BY MOUTH IN THE MORNING. NEED APPOINTMENT ?  ?OXYGEN ?Inhale 4.5 L/min into the lungs continuous. ?  ?rosuvastatin 10 MG tablet ?Commonly known as: CRESTOR ?TAKE 1 TABLET AT BEDTIME ?  ?Trelegy Ellipta 100-62.5-25 MCG/ACT Aepb ?Generic drug: Fluticasone-Umeclidin-Vilant ?Inhale 1 puff into the lungs daily. ?  ? ?  ? ? ? ?No Known Allergies ? ?Past Medical History:  ?Diagnosis Date  ? Aortic insufficiency   ? MODERATE  WITH A BICUSPID AORTIC VAVLE  ? Arterial occlusive disease   ? MULTILEVEL  ? CHF (congestive heart failure) ([place])   ? COPD (chronic obstructive pulmonary disease) ([place])   ? Dilated cardiomyopathy ([place])   ? WITH EJECTION FRACTION DOWN TO 20-25%--WITH CONGESTIVE HEART FAILURE  ? Edema   ? LOWER EXTREMETIES  ? Hyperglycemia 01/2020  ? Hypertension   ? Normal coronary arteries 2009  ? Orthopnea   ? Peripheral arterial disease ([place])   ? Pulmonary hypertension ([place])   ? ? ?Past Surgical History:  ?Procedure Laterality Date  ? ABDOMINAL AORTAGRAM N/A 11/05/2014  ? Procedure: ABDOMINAL AORTAGRAM;  Surgeon: Angelia Mould, MD;  Location[facility] CATH LAB;  Service: Cardiovascular;  Laterality: N/A;  ? ABDOMINAL AORTOGRAM W/LOWER EXTREMITY Bilateral 04/21/2019  ? Procedure: ABDOMINAL AORTOGRAM W/LOWER EXTREMITY;  Surgeon: Scot Dock,  [name], MD;  Location: [place] CV LAB;  Service: Cardiovascular;  Laterality: Bilateral;  ? ABDOMINAL AORTOGRAM W/LOWER EXTREMITY Right 05/30/2021  ? Procedure: ABDOMINAL AORTOGRAM W/LOWER EXTREMITY;  Surgeon: Angelia Mould, MD;  Location: [place] CV LAB;  Service: Cardiovascular;  Laterality: Right;  ? AORTA - BILATERAL FEMORAL ARTERY BYPASS GRAFT N/A 12/18/2014  ? Procedure: AORTOBIFEMORAL BYPASS GRAFT;  Surgeon: Angelia Mould, MD;  Location: [place];  Service: Vascular;  Laterality: N/A;  ? CARDIAC CATHETERIZATION  2009  ? Nl Cors, EF 20%  ? COLONOSCOPY    ? ENDARTERECTOMY FEMORAL Left 12/18/2014  ? Procedure: ENDARTERECTOMY FEMORAL;  Surgeon: Angelia Mould, MD;  Location: [place];  Service: Vascular;  Laterality: Left;  ? FALSE ANEURYSM REPAIR Right 04/24/2019  ? Procedure: REPAIR OF RIGHT FEMORAL ARTERY PSEUDOANEURYSM;  Surgeon: Rosetta Posner, MD;  Location: [facility];  Service: Vascular;  Laterality: Right;  ? FEMORAL-POPLITEAL BYPASS GRAFT  02/21/2011  ? right  ? FEMORAL-TIBIAL BYPASS GRAFT Right 04/24/2019  ? Procedure: REDO BYPASS GRAFT RIGHT FEMORAL-TIBIAL ARTERY  USING LEFT LEG VEIN & HEMASHIELD GOLD 34mm GRAFT;  Surgeon: Rosetta Posner, MD;  Location: [place];  Service: Vascular;  Laterality: Right;  ? IR FLUORO GUIDE CV LINE RIGHT  12/28/2019  ? IR REMOVAL TUN CV CATH W

## 2022-02-12 ENCOUNTER — Encounter: Payer: Self-pay | Admitting: Cardiology

## 2022-02-12 ENCOUNTER — Ambulatory Visit: Payer: Medicare HMO | Admitting: Cardiology

## 2022-02-12 VITALS — BP 162/72 | HR 86 | Ht 71.0 in | Wt 160.0 lb

## 2022-02-12 DIAGNOSIS — I739 Peripheral vascular disease, unspecified: Secondary | ICD-10-CM

## 2022-02-12 DIAGNOSIS — I10 Essential (primary) hypertension: Secondary | ICD-10-CM

## 2022-02-12 DIAGNOSIS — I5022 Chronic systolic (congestive) heart failure: Secondary | ICD-10-CM | POA: Diagnosis not present

## 2022-02-12 DIAGNOSIS — E119 Type 2 diabetes mellitus without complications: Secondary | ICD-10-CM

## 2022-02-12 DIAGNOSIS — E785 Hyperlipidemia, unspecified: Secondary | ICD-10-CM | POA: Diagnosis not present

## 2022-02-12 DIAGNOSIS — Z794 Long term (current) use of insulin: Secondary | ICD-10-CM

## 2022-02-12 DIAGNOSIS — I70229 Atherosclerosis of native arteries of extremities with rest pain, unspecified extremity: Secondary | ICD-10-CM | POA: Diagnosis not present

## 2022-02-12 DIAGNOSIS — J439 Emphysema, unspecified: Secondary | ICD-10-CM

## 2022-02-12 DIAGNOSIS — N1831 Chronic kidney disease, stage 3a: Secondary | ICD-10-CM | POA: Diagnosis not present

## 2022-02-12 LAB — BASIC METABOLIC PANEL
BUN/Creatinine Ratio: 10 (ref 10–24)
BUN: 17 mg/dL (ref 8–27)
CO2: 24 mmol/L (ref 20–29)
Calcium: 9.5 mg/dL (ref 8.6–10.2)
Chloride: 101 mmol/L (ref 96–106)
Creatinine, Ser: 1.65 mg/dL — ABNORMAL HIGH (ref 0.76–1.27)
Glucose: 220 mg/dL — ABNORMAL HIGH (ref 70–99)
Potassium: 4.5 mmol/L (ref 3.5–5.2)
Sodium: 141 mmol/L (ref 134–144)
eGFR: 45 mL/min/{1.73_m2} — ABNORMAL LOW (ref >59)

## 2022-02-12 LAB — LIPID PANEL
Chol/HDL Ratio: 3.2 ratio (ref 0.0–5.0)
Cholesterol, Total: 142 mg/dL (ref 100–199)
HDL: 44 mg/dL (ref >39)
LDL Chol Calc (NIH): 76 mg/dL (ref 0–99)
Triglycerides: 123 mg/dL (ref 0–149)
VLDL Cholesterol Cal: 22 mg/dL (ref 5–40)

## 2022-02-12 LAB — HEPATIC FUNCTION PANEL
ALT: 17 IU/L (ref 0–44)
AST: 25 IU/L (ref 0–40)
Albumin: 4 g/dL (ref 3.8–4.8)
Alkaline Phosphatase: 82 IU/L (ref 44–121)
Bilirubin Total: 0.6 mg/dL (ref 0.0–1.2)
Bilirubin, Direct: 0.18 mg/dL (ref 0.00–0.40)
Total Protein: 7 g/dL (ref 6.0–8.5)

## 2022-02-12 LAB — CBC WITH DIFFERENTIAL/PLATELET
Basophils Absolute: 0 10*3/uL (ref 0.0–0.2)
Basos: 1 %
EOS (ABSOLUTE): 0.1 10*3/uL (ref 0.0–0.4)
Eos: 2 %
Hematocrit: 45 % (ref 37.5–51.0)
Hemoglobin: 14.8 g/dL (ref 13.0–17.7)
Immature Grans (Abs): 0 10*3/uL (ref 0.0–0.1)
Immature Granulocytes: 1 %
Lymphocytes Absolute: 1.5 10*3/uL (ref 0.7–3.1)
Lymphs: 23 %
MCH: 31.3 pg (ref 26.6–33.0)
MCHC: 32.9 g/dL (ref 31.5–35.7)
MCV: 95 fL (ref 79–97)
Monocytes Absolute: 0.8 10*3/uL (ref 0.1–0.9)
Monocytes: 12 %
Neutrophils Absolute: 4 10*3/uL (ref 1.4–7.0)
Neutrophils: 61 %
Platelets: 174 10*3/uL (ref 150–450)
RBC: 4.73 x10E6/uL (ref 4.14–5.80)
RDW: 15 % (ref 11.6–15.4)
WBC: 6.5 10*3/uL (ref 3.4–10.8)

## 2022-02-12 LAB — HEMOGLOBIN A1C
Est. average glucose Bld gHb Est-mCnc: 131 mg/dL
Hgb A1c MFr Bld: 6.2 % — ABNORMAL HIGH (ref 4.8–5.6)

## 2022-02-12 MED ORDER — FUROSEMIDE 40 MG PO TABS: 20.0000 mg | ORAL_TABLET | Freq: Every day | ORAL | 2 refills | Status: DC

## 2022-02-12 NOTE — Addendum Note (Signed)
Addended by: Kathyrn Lass on: 02/12/2022 09:24 AM ? ? Modules accepted: Orders ? ?

## 2022-02-17 ENCOUNTER — Telehealth: Payer: Self-pay

## 2022-02-17 NOTE — Telephone Encounter (Signed)
Left message with pt's wife regarding samples of trelegy 100. Wanted to f/u to see how much medication Kyle [name] had left before providing samples.  ?

## 2022-02-27 ENCOUNTER — Other Ambulatory Visit: Payer: Self-pay | Admitting: Family Medicine

## 2022-03-02 ENCOUNTER — Other Ambulatory Visit: Payer: Self-pay

## 2022-03-02 ENCOUNTER — Other Ambulatory Visit: Payer: Self-pay | Admitting: Cardiology

## 2022-03-02 ENCOUNTER — Other Ambulatory Visit: Payer: Self-pay | Admitting: Family Medicine

## 2022-03-02 MED ORDER — ISOSORBIDE MONONITRATE ER 30 MG PO TB24: ORAL_TABLET | ORAL | 3 refills | Status: DC

## 2022-03-02 NOTE — Telephone Encounter (Signed)
Pt needs an appointment

## 2022-03-10 ENCOUNTER — Ambulatory Visit: Payer: Medicare HMO | Admitting: Podiatry

## 2022-03-13 ENCOUNTER — Ambulatory Visit (INDEPENDENT_AMBULATORY_CARE_PROVIDER_SITE_OTHER): Payer: Medicare HMO | Admitting: Podiatry

## 2022-03-13 ENCOUNTER — Encounter: Payer: Self-pay | Admitting: Podiatry

## 2022-03-13 DIAGNOSIS — B351 Tinea unguium: Secondary | ICD-10-CM

## 2022-03-13 DIAGNOSIS — Q828 Other specified congenital malformations of skin: Secondary | ICD-10-CM

## 2022-03-13 DIAGNOSIS — M79675 Pain in left toe(s): Secondary | ICD-10-CM | POA: Diagnosis not present

## 2022-03-13 DIAGNOSIS — M79674 Pain in right toe(s): Secondary | ICD-10-CM

## 2022-03-13 DIAGNOSIS — E1142 Type 2 diabetes mellitus with diabetic polyneuropathy: Secondary | ICD-10-CM | POA: Diagnosis not present

## 2022-03-13 DIAGNOSIS — L84 Corns and callosities: Secondary | ICD-10-CM | POA: Diagnosis not present

## 2022-03-13 DIAGNOSIS — I739 Peripheral vascular disease, unspecified: Secondary | ICD-10-CM

## 2022-03-21 NOTE — Progress Notes (Signed)
ANNUAL DIABETIC FOOT EXAM  Subjective: Kyle [name] presents today for annual diabetic foot examination.  Patient relates 5 year h/o diabetes.  Patient denies any h/o foot wounds.  Patient denies any numbness, tingling, burning, or pins/needle sensation in feet.  Patient's blood sugar was 97 mg/dl today. Last known  HgA1c was unknown  Risk factors: diabetes, PAD with intermittent claudication, HTN, COPD, CHF, CKD, dyslipidemia.  Kyle Hensen, DO is patient's PCP. Last visit was May 02, 2021.  Past Medical History:  Diagnosis Date   Aortic insufficiency    MODERATE WITH A BICUSPID AORTIC VAVLE   Arterial occlusive disease    MULTILEVEL   CHF (congestive heart failure) (HCC)    COPD (chronic obstructive pulmonary disease) ([place])    Dilated cardiomyopathy ([place])    WITH EJECTION FRACTION DOWN TO 20-25%--WITH CONGESTIVE HEART FAILURE   Edema    LOWER EXTREMETIES   Hyperglycemia 01/2020   Hypertension    Normal coronary arteries 2009   Orthopnea    Peripheral arterial disease ([place])    Pulmonary hypertension ([place])    Patient Active Problem List   Diagnosis Date Noted   Chronic respiratory failure with hypoxia ([place]) 01/29/2022   Encounter for long-term (current) insulin use ([place])    Acute on chronic systolic heart failure ([place]) 04/10/2021   Atrial flutter ([place])    Noncompliance 04/16/2020   Hyperosmolar syndrome 02/20/2020   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[place]) 02/20/2020   CKD (chronic kidney disease) stage 4, GFR 15-29 ml/min (HCC) 02/19/2020   Abnormal findings on diagnostic imaging of lung 01/29/2020   Anticoagulant long-term use 01/29/2020   Heart failure ([place]) 01/20/2020   SOB (shortness of breath)    Uremia 12/24/2019   Atrial fibrillation, chronic ([place]) 12/24/2019   Diabetes mellitus type 2, insulin dependent ([place]) 12/24/2019   Hyperkalemia, diminished renal excretion 12/24/2019   Healthcare maintenance 07/14/2019   Medication management 07/14/2019    Surgical wound dehiscence, initial encounter 05/11/2019   PAD (peripheral artery disease) ([place]) 04/22/2019   Atherosclerosis of artery of extremity with rest pain ([place]) 04/21/2019   Deformity of metatarsal bone of right foot 04/17/2019   COPD with chronic bronchitis and emphysema ([place]) 12/16/2018   Acute renal failure (HCC)    Elevated PSA 12/27/2017   Abnormal EKG 12/21/2017   Protein-calorie malnutrition, severe ([place]) 12/18/2014   Aortoiliac occlusive disease ([place]) 12/16/2014   PVD (peripheral vascular disease) ([place]) 10/24/2014   Aortic stenosis 09/12/2014   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   Pulmonary hypertension ([place]) 09/12/2014   Dyslipidemia 09/12/2014   Atherosclerosis of native arteries of extremity with intermittent claudication ([place]) 05/11/2012   Aortic insufficiency    Hypertension    Peripheral arterial disease ([place])    Dilated cardiomyopathy ([place])    Chronic systolic CHF (congestive heart failure) ([place])    Past Surgical History:  Procedure Laterality Date   ABDOMINAL AORTAGRAM N/A 11/05/2014   Procedure: ABDOMINAL Maxcine Ham;  Surgeon: Angelia Mould, MD;  Location: [facility] CATH LAB;  Service: Cardiovascular;  Laterality: N/A;   ABDOMINAL AORTOGRAM W/LOWER EXTREMITY Bilateral 04/21/2019   Procedure: ABDOMINAL AORTOGRAM W/LOWER EXTREMITY;  Surgeon: Angelia Mould, MD;  Location: [place] CV LAB;  Service: Cardiovascular;  Laterality: Bilateral;   ABDOMINAL AORTOGRAM W/LOWER EXTREMITY Right 05/30/2021   Procedure: ABDOMINAL AORTOGRAM W/LOWER EXTREMITY;  Surgeon: Angelia Mould, MD;  Location: [place] CV LAB;  Service: Cardiovascular;  Laterality: Right;   AORTA - BILATERAL FEMORAL ARTERY BYPASS GRAFT N/A 12/18/2014   Procedure:  AORTOBIFEMORAL BYPASS GRAFT;  Surgeon: Angelia Mould, MD;  Location: [place];  Service: Vascular;  Laterality: N/A;   CARDIAC CATHETERIZATION  2009   Nl Cors, EF 20%   COLONOSCOPY     ENDARTERECTOMY FEMORAL  Left 12/18/2014   Procedure: ENDARTERECTOMY FEMORAL;  Surgeon: Angelia Mould, MD;  Location: [place];  Service: Vascular;  Laterality: Left;   FALSE ANEURYSM REPAIR Right 04/24/2019   Procedure: REPAIR OF RIGHT FEMORAL ARTERY PSEUDOANEURYSM;  Surgeon: Rosetta Posner, MD;  Location: [place];  Service: Vascular;  Laterality: Right;   FEMORAL-POPLITEAL BYPASS GRAFT  02/21/2011   right   FEMORAL-TIBIAL BYPASS GRAFT Right 04/24/2019   Procedure: REDO BYPASS GRAFT RIGHT FEMORAL-TIBIAL ARTERY USING LEFT LEG VEIN & HEMASHIELD GOLD 61mm GRAFT;  Surgeon: Rosetta Posner, MD;  Location: [place];  Service: Vascular;  Laterality: Right;   IR FLUORO GUIDE CV LINE RIGHT  12/28/2019   IR REMOVAL TUN CV CATH W/O FL  01/11/2020   IR US GUIDE VASC ACCESS RIGHT  12/28/2019   OTHER SURGICAL HISTORY     POST RIGHT FEMOROPOPLITEAL BYPASS GRAFT   PERIPHERAL VASCULAR CATHETERIZATION  11/05/2014   Procedure: LOWER EXTREMITY ANGIOGRAPHY;  Surgeon: Angelia Mould, MD;  Location: [facility] LAB;  Service: Cardiovascular;;   VEIN HARVEST Left 04/24/2019   Procedure: LEFT LEG SAPHENOUS VEIN HARVEST;  Surgeon: Rosetta Posner, MD;  Location: [facility] OR;  Service: Vascular;  Laterality: Left;   Current Outpatient Medications on File Prior to Visit  Medication Sig Dispense Refill   ACCU-CHEK FASTCLIX LANCETS MISC      albuterol (VENTOLIN HFA) 108 (90 Base) MCG/ACT inhaler Inhale 2 puffs into the lungs every 6 (six) hours as needed for wheezing or shortness of breath. 8 g 2   apixaban (ELIQUIS) 5 MG TABS tablet Take 1 tablet (5 mg total) by mouth 2 (two) times daily. 180 tablet 0   carvedilol (COREG) 6.25 MG tablet TAKE 1 TABLET TWICE DAILY WITH MEALS (NEED MD APPOINTMENT) 180 tablet 3   Fluticasone-Umeclidin-Vilant (TRELEGY ELLIPTA) 100-62.5-25 MCG/INH AEPB Inhale 1 puff into the lungs daily. 1 each 11   furosemide (LASIX) 40 MG tablet Take 0.5 tablets (20 mg total) by mouth daily. 30 tablet 2   glucose blood (ACCU-CHEK GUIDE) test strip  TEST BLOOD SUGAR TWICE DAILY 200 strip 2   guaiFENesin (MUCINEX) 600 MG 12 hr tablet Take 2 tablets (1,200 mg total) by mouth 2 (two) times daily. (Patient taking differently: Take 600 mg by mouth 2 (two) times daily.) 30 tablet 0   hydrALAZINE (APRESOLINE) 25 MG tablet Take 1 tablet (25 mg total) by mouth 3 (three) times daily. Schedule appointment with PCP. 90 tablet 0   insulin aspart (NOVOLOG) 100 UNIT/ML FlexPen Inject 5 Units into the skin 3 (three) times daily with meals. 30 mL 0   insulin detemir (LEVEMIR) 100 unit/ml SOLN Inject 28 Units into the skin in the morning.     Insulin Pen Needle 32G X 4 MM MISC Use as directed with insulin pen Strength: 32G X 4 MM 200 each 1   isosorbide mononitrate (IMDUR) 30 MG 24 hr tablet TAKE 1 TABLET BY MOUTH ONCE DAILY. 90 tablet 3   levETIRAcetam (KEPPRA) 500 MG tablet TAKE 1 TABLET EVERY MORNING .NEED APPOINTMENT 30 tablet 0   Multiple Vitamins-Minerals (CENTRUM SILVER 50+MEN) TABS Take 1 tablet by mouth daily with breakfast.     OXYGEN Inhale 4.5 L/min into the lungs continuous.     rosuvastatin (CRESTOR)  10 MG tablet TAKE 1 TABLET AT BEDTIME 90 tablet 0   No current facility-administered medications on file prior to visit.    No Known Allergies Social History   Occupational History   Occupation: retired    Fish farm manager: [employer]  Tobacco Use   Smoking status: Former    Packs/day: 1.00    Years: 40.00    Pack years: 40.00    Types: Cigarettes    Quit date: 10/15/2018    Years since quitting: 3.4   Smokeless tobacco: Never   Tobacco comments:    quit 2010 and quit again 2019  Vaping Use   Vaping Use: Never used  Substance and Sexual Activity   Alcohol use: No    Alcohol/week: 0.0 standard drinks   Drug use: No   Sexual activity: Not on file   Family History  Problem Relation Age of Onset   Diabetes Mother    Hyperlipidemia Sister    Hypertension Sister    Immunization History  Administered Date(s) Administered   Fluad  Quad(high Dose 65+) 07/14/2019, 07/23/2020   Influenza-Unspecified 01/26/2017, 08/10/2017   Moderna Sars-Covid-2 Vaccination 02/10/2020, 03/09/2020, 09/30/2020   PFIZER Comirnaty([place])Covid-19 Tri-Sucrose Vaccine 05/02/2021   Pneumococcal Conjugate-13 01/20/2018   Pneumococcal Polysaccharide-23 07/14/2019     Review of Systems: Negative except as noted in the HPI.   Objective: There were no vitals filed for this visit.  Kyle [name] is a pleasant 71 y.o. male in NAD. AAO X 3.  Vascular Examination: CFT <3 seconds b/l LE. Faintly palpable DP pulses b/l LE. Faintly palpable PT pulse(s) b/l LE. Pedal hair absent. No pain with calf compression b/l. Lower extremity skin temperature gradient within normal limits. No edema noted b/l LE. No ischemia or gangrene noted b/l LE. No cyanosis or clubbing noted b/l LE.  Dermatological Examination: Pedal skin thin and atrophic b/l LE. No open wounds b/l LE. No interdigital macerations noted b/l LE. Toenails 1-5 b/l elongated, discolored, dystrophic, thickened, crumbly with subungual debris and tenderness to dorsal palpation. Hyperkeratotic lesion(s) submet head 5 b/l.  No erythema, no edema, no drainage, no fluctuance. Porokeratotic lesion(s) submet head 1 right foot. No erythema, no edema, no drainage, no fluctuance.  Neurological Examination: Protective sensation intact 5/5 intact bilaterally with 10g monofilament b/l. Vibratory sensation intact b/l.  Musculoskeletal Examination: Muscle strength 5/5 to all lower extremity muscle groups bilaterally. No pain, crepitus or joint limitation noted with ROM bilateral LE. Hammertoe deformity noted 2-5 b/l.  Footwear Assessment: Does the patient wear appropriate shoes? Yes. Does the patient need inserts/orthotics? Yes.     Latest Ref Rng & Units 02/12/2022    9:43 AM 04/07/2021    4:49 PM  Hemoglobin A1C  Hemoglobin-A1c 4.8 - 5.6 % 6.2   7.0     Assessment: 1. Pain due to onychomycosis of  toenails of both feet   2. Callus   3. Porokeratosis   4. Diabetic peripheral neuropathy associated with type 2 diabetes mellitus ([place])   5. PAD (peripheral artery disease) (HCC)      ADA Risk Categorization: High Risk  Patient has one or more of the following: Loss of protective sensation Absent pedal pulses Severe Foot deformity History of foot ulcer  Plan: -Patient was evaluated and treated. All patient's and/or POA's questions/concerns answered on today's visit. -Diabetic foot examination performed today. -Continue foot and shoe inspections daily. Monitor blood glucose per PCP/Endocrinologist's recommendations. -Mycotic toenails 1-5 bilaterally were debrided in length and girth with sterile nail nippers and  dremel without incident. -Callus(es) submet head 5 b/l pared utilizing sterile scalpel blade without complication or incident. Total number debrided =2. -Porokeratotic lesion(s) submet head 1 right foot pared and enucleated with sterile scalpel blade without incident. Total number of lesions debrided=1. -Patient/POA to call should there be question/concern in the interim. Return in about 3 months (around 06/13/2022).  Marzetta Board, DPM

## 2022-03-24 ENCOUNTER — Other Ambulatory Visit: Payer: Self-pay

## 2022-03-24 MED ORDER — FUROSEMIDE 40 MG PO TABS: 20.0000 mg | ORAL_TABLET | Freq: Every day | ORAL | 3 refills | Status: DC

## 2022-03-25 ENCOUNTER — Other Ambulatory Visit: Payer: Self-pay | Admitting: Adult Health

## 2022-03-25 MED ORDER — ACCU-CHEK GUIDE VI STRP: ORAL_STRIP | 2 refills | Status: DC

## 2022-03-25 MED ORDER — INSULIN PEN NEEDLE 32G X 4 MM MISC: 1 refills | Status: DC

## 2022-03-25 MED ORDER — ACCU-CHEK FASTCLIX LANCETS MISC: 0 refills | Status: DC

## 2022-03-31 ENCOUNTER — Encounter: Payer: Self-pay | Admitting: *Deleted

## 2022-04-16 ENCOUNTER — Telehealth: Payer: Self-pay

## 2022-04-23 ENCOUNTER — Other Ambulatory Visit: Payer: Self-pay | Admitting: Family Medicine

## 2022-04-24 NOTE — Telephone Encounter (Signed)
Patient presents to clinic to pick up medications. Medications provided per note from [place].   Talbot Grumbling, RN

## 2022-04-28 ENCOUNTER — Other Ambulatory Visit: Payer: Self-pay | Admitting: Adult Health

## 2022-04-29 ENCOUNTER — Telehealth: Payer: Self-pay | Admitting: Pulmonary Disease

## 2022-04-29 ENCOUNTER — Telehealth: Payer: Self-pay | Admitting: Cardiology

## 2022-04-29 MED ORDER — TRELEGY ELLIPTA 100-62.5-25 MCG/ACT IN AEPB: 1.0000 | INHALATION_SPRAY | Freq: Every day | RESPIRATORY_TRACT | 0 refills | Status: DC

## 2022-04-29 NOTE — Telephone Encounter (Signed)
Spoke with the pt's spouse  She is asking for a sample of trelegy for the pt  She also states has spent over $600 on rxs this year, so wants to reapply for assistance through [employer]   I have left assistance forms and 1 sample up front for pick up  Nothing further needed

## 2022-04-29 NOTE — Telephone Encounter (Signed)
Patient calling the office for samples of medication:   1.  What medication and dosage are you requesting samples for? apixaban (ELIQUIS) 5 MG TABS tablet  2.  Are you currently out of this medication? Yes    

## 2022-04-30 NOTE — Telephone Encounter (Signed)
Patient's wife called to check on status as patient is out of medication.

## 2022-04-30 NOTE — Telephone Encounter (Signed)
Spoke to patient's wife Eliquis 5 mg samples left at front desk along with Eliquis patient assistance form to complete and bring back along with proof of income.

## 2022-05-04 ENCOUNTER — Other Ambulatory Visit: Payer: Self-pay

## 2022-05-04 MED ORDER — APIXABAN 5 MG PO TABS: 5.0000 mg | ORAL_TABLET | Freq: Two times a day (BID) | ORAL | 3 refills | Status: DC

## 2022-05-05 ENCOUNTER — Telehealth: Payer: Self-pay

## 2022-05-05 ENCOUNTER — Encounter: Payer: Self-pay | Admitting: Acute Care

## 2022-05-05 ENCOUNTER — Ambulatory Visit: Payer: Medicare HMO | Admitting: Acute Care

## 2022-05-05 VITALS — BP 136/68 | HR 81 | Temp 98.4°F | Ht 71.0 in | Wt 162.0 lb

## 2022-05-05 DIAGNOSIS — Z87891 Personal history of nicotine dependence: Secondary | ICD-10-CM | POA: Diagnosis not present

## 2022-05-05 DIAGNOSIS — J449 Chronic obstructive pulmonary disease, unspecified: Secondary | ICD-10-CM | POA: Diagnosis not present

## 2022-05-05 NOTE — Telephone Encounter (Signed)
Called and spoke with Kyle [name] at [facility] to determine the issues with patients oxygen. Patient states he has been trying to contact [facility] to service his concentrator at home and his portable concentrator. [facility] states they have not received any messages from [facility]. I placed a request today 05/05/22 for [employer] to go out to service his equipment to help him. Pt is unable to wear his oxygen right now due to issues with the machine. He still uses home concentrator. [facility] states that they will contact patient to set up a day and time to go out to see his equipment. Nothing further.

## 2022-05-05 NOTE — Telephone Encounter (Signed)
Left message requesting call back regarding novo nordisk shipment.  Pt has pen needles labeled and ready in med room. If not needed, let me know & we will use as samples.

## 2022-05-05 NOTE — Progress Notes (Signed)
History of Present Illness Kyle [name] is a 71 y.o. male former smoker with history significant for emphysema and chronic respiratory failure on oxygen PMH - cardiomyopathy, HFrEF and PAD s/p right femoral peroneal bypass . He is followed by Dr. Elsworth Soho.   05/05/2022 Pt. Presents for follow up. He states he has been doing well. He is compliant with his Trelegy once daily. He uses his rescue inhaler maybe once daily. He has a concentrator for oxygen at home, but he states he needs better options for the portable oxygen. He is currently not wearing oxygen whrn he leaves home as the portable tanks he has are too heavy for him to carry.  He was walked previously on a Inogen, but sats dropped on the pulsed oxygen, so he did not qualify.  We will place an order for his home concentrator to be serviced and to see if there are lighter options for portability. He wears his oxygen day and night at 4 L. He states he rarely wheezes, and has minimal secretions. He does state he gets dyspnea with exertion. He states he just paces himself as needed , and that works well for him. My biggest concern is that he does go without oxygen when he is out and about. We will try and find him a better portable option.   Test Results: LDCT 04/2021 RADS2 - RLL nodule unchanged, suspected PAH.    LDCT 02/2020 - lung RADS 2.  Moderate to advanced changes of emphysema.  And tiny nodule in the left base at 4.7 mm.   11/20/2018-CT head without contrast- sinuses normal   12/16/2018-spirometry- FVC 1.7 (43% predicted), ratio 64, FEV1 1.1 (36% predicted), consistent with severe airway obstruction       Latest Ref Rng & Units 02/12/2022    9:43 AM 05/30/2021    6:58 AM 05/06/2021    7:46 PM  CBC  WBC 3.4 - 10.8 x10E3/uL 6.5   7.6   Hemoglobin 13.0 - 17.7 g/dL 14.8  16.0  16.3   Hematocrit 37.5 - 51.0 % 45.0  47.0  52.1   Platelets 150 - 450 x10E3/uL 174   196        Latest Ref Rng & Units 02/12/2022    9:43 AM 05/30/2021     6:58 AM 05/06/2021    7:46 PM  BMP  Glucose 70 - 99 mg/dL 220  58  52   BUN 8 - 27 mg/dL 17  27  24    Creatinine 0.76 - 1.27 mg/dL 1.65  1.80  1.83   BUN/Creat Ratio 10 - 24 10     Sodium 134 - 144 mmol/L 141  143  145   Potassium 3.5 - 5.2 mmol/L 4.5  3.8  3.6   Chloride 96 - 106 mmol/L 101  105  101   CO2 20 - 29 mmol/L 24   33   Calcium 8.6 - 10.2 mg/dL 9.5   9.3     BNP    Component Value Date/Time   BNP 1,899.5 (H) 04/07/2021 1100   BNP 997.9 (H) 11/09/2014 1333    ProBNP    Component Value Date/Time   PROBNP 483.0 (H) 10/19/2008 0328    PFT No results found for: "FEV1PRE", "FEV1POST", "FVCPRE", "FVCPOST", "TLC", "DLCOUNC", "PREFEV1FVCRT", "PSTFEV1FVCRT"  No results found.   Past medical hx Past Medical History:  Diagnosis Date   Aortic insufficiency    MODERATE WITH A BICUSPID AORTIC VAVLE   Arterial occlusive disease  MULTILEVEL   CHF (congestive heart failure) (HCC)    COPD (chronic obstructive pulmonary disease) ([place])    Dilated cardiomyopathy ([place])    WITH EJECTION FRACTION DOWN TO 20-25%--WITH CONGESTIVE HEART FAILURE   Edema    LOWER EXTREMETIES   Hyperglycemia 01/2020   Hypertension    Normal coronary arteries 2009   Orthopnea    Peripheral arterial disease ([place])    Pulmonary hypertension (HCC)      Social History   Tobacco Use   Smoking status: Former    Packs/day: 1.00    Years: 40.00    Total pack years: 40.00    Types: Cigarettes    Quit date: 10/15/2018    Years since quitting: 3.5   Smokeless tobacco: Never   Tobacco comments:    quit 2010 and quit again 2019  Vaping Use   Vaping Use: Never used  Substance Use Topics   Alcohol use: No    Alcohol/week: 0.0 standard drinks of alcohol   Drug use: No    Mr.Limbaugh reports that he quit smoking about 3 years ago. His smoking use included cigarettes. He has a 40.00 pack-year smoking history. He has never used smokeless tobacco. He reports that he does not drink alcohol and does  not use drugs.  Tobacco Cessation: Former smoker, Quit in 2010 and again in 2019 with a 40 pack year smoking history  Past surgical hx, Family hx, Social hx all reviewed.  Current Outpatient Medications on File Prior to Visit  Medication Sig   Accu-Chek FastClix Lancets MISC Use to check glucose as indicated   albuterol (VENTOLIN HFA) 108 (90 Base) MCG/ACT inhaler Inhale 2 puffs into the lungs every 6 (six) hours as needed for wheezing or shortness of breath.   apixaban (ELIQUIS) 5 MG TABS tablet Take 1 tablet (5 mg total) by mouth 2 (two) times daily.   carvedilol (COREG) 6.25 MG tablet TAKE 1 TABLET TWICE DAILY WITH MEALS (NEED MD APPOINTMENT)   Fluticasone-Umeclidin-Vilant (TRELEGY ELLIPTA) 100-62.5-25 MCG/ACT AEPB INHALE 1 PUFF INTO THE LUNGS ONCE DAILY   Fluticasone-Umeclidin-Vilant (TRELEGY ELLIPTA) 100-62.5-25 MCG/ACT AEPB Inhale 1 puff into the lungs daily.   furosemide (LASIX) 40 MG tablet Take 0.5 tablets (20 mg total) by mouth daily.   glucose blood (ACCU-CHEK GUIDE) test strip TEST BLOOD SUGAR TWICE DAILY   guaiFENesin (MUCINEX) 600 MG 12 hr tablet Take 2 tablets (1,200 mg total) by mouth 2 (two) times daily. (Patient taking differently: Take 600 mg by mouth 2 (two) times daily.)   hydrALAZINE (APRESOLINE) 25 MG tablet Take 1 tablet (25 mg total) by mouth 3 (three) times daily. Need appointment with PCP for another refill.   insulin aspart (NOVOLOG) 100 UNIT/ML FlexPen Inject 5 Units into the skin 3 (three) times daily with meals.   insulin detemir (LEVEMIR) 100 unit/ml SOLN Inject 28 Units into the skin in the morning.   Insulin Pen Needle 32G X 4 MM MISC Use as directed with insulin pen Strength: 32G X 4 MM   isosorbide mononitrate (IMDUR) 30 MG 24 hr tablet TAKE 1 TABLET BY MOUTH ONCE DAILY.   levETIRAcetam (KEPPRA) 500 MG tablet Take 1 tablet (500 mg total) by mouth daily. Must have appointment prior to next refill.   Multiple Vitamins-Minerals (CENTRUM SILVER 50+MEN) TABS  Take 1 tablet by mouth daily with breakfast.   OXYGEN Inhale 4.5 L/min into the lungs continuous.   rosuvastatin (CRESTOR) 10 MG tablet TAKE 1 TABLET AT BEDTIME   No current facility-administered medications on  file prior to visit.     No Known Allergies  Review Of Systems:  Constitutional:   No  weight loss, night sweats,  Fevers, chills, fatigue, or  lassitude.  HEENT:   No headaches,  Difficulty swallowing,  Tooth/dental problems, or  Sore throat,                No sneezing, itching, ear ache, nasal congestion, post nasal drip,   CV:  No chest pain,  Orthopnea, PND, swelling in lower extremities, anasarca, dizziness, palpitations, syncope.   GI  No heartburn, indigestion, abdominal pain, nausea, vomiting, diarrhea, change in bowel habits, loss of appetite, bloody stools.   Resp: + shortness of breath with exertion or at rest.  No excess mucus, minimal  productive cough,  No non-productive cough,  No coughing up of blood.  No change in color of mucus.  Rare wheezing.  No chest wall deformity  Skin: no rash or lesions.  GU: no dysuria, change in color of urine, no urgency or frequency.  No flank pain, no hematuria   MS:  No joint pain or swelling.  No decreased range of motion.  No back pain.  Psych:  No change in mood or affect. No depression or anxiety.  No memory loss.   Vital Signs BP 136/68 (BP Location: Left Arm, Patient Position: Sitting, Cuff Size: Normal)   Pulse 81   Temp 98.4 F (36.9 C) (Oral)   Ht 5\' 11"  (1.803 m)   Wt 162 lb (73.5 kg)   SpO2 93%   BMI 22.59 kg/m    Physical Exam:  General- No distress,  A&Ox3, pleasant ENT: No sinus tenderness, TM clear, pale nasal mucosa, no oral exudate,no post nasal drip, no LAN Cardiac: S1, S2, regular rate and rhythm, no murmur Chest: No wheeze/ rales/ dullness; no accessory muscle use, no nasal flaring, no sternal retractions, diminished per bases Abd.: Soft Non-tender, ND, BS +, Body mass index is 22.59  kg/m. Ext: No clubbing cyanosis, edema Neuro:  normal strength, MAE x 4, A&O x 3 Skin: No rashes, warm and dry, no lesions  Psych: normal mood and behavior   Assessment/Plan COPD in former smoker Chronic Hypoxic respiratory failure on oxygen 24/7 Plan We will place an order for [employer] to come and service your Concentrator . We will also ask them if there are any options for lighter portable oxygen.  Continue wearing oxygen at 4 L [place] as you have been doing.  Saturation goals are > 90% at all times  Continue Trelegy 1 puff once daily Rinse mouth after use Use your rescue inhaler as needed for breakthrough shortness of breath or wheezing.  Remember your lung cancer screening 05/21/2022 at 3 pm.  Follow Up with Dr. Elsworth Soho or Judson Roch NP in 3 months or before as needed Please contact office for sooner follow up if symptoms do not improve or worsen or seek emergency care     I spent 35 minutes dedicated to the care of this patient on the date of this encounter to include pre-visit review of records, face-to-face time with the patient discussing conditions above, post visit ordering of testing, clinical documentation with the electronic health record, making appropriate referrals as documented, and communicating necessary information to the patient's healthcare team.    Magdalen Spatz, NP 05/05/2022  12:40 PM

## 2022-05-05 NOTE — Patient Instructions (Addendum)
It is good to see you today. We will place an order for [employer] to come and service your Concentrator . We will also ask them if there are any options for lighter portable oxygen.  Continue wearing oxygen at 4 L [place] as you have been doing.  Saturation goals are > 90% Continue Trelegy 1 puff once daily Rinse mouth after use Use your rescue inhaler as needed for breakthrough shortness of breath or wheezing.  Remember your lung cancer screening 05/21/2022 at 3 pm.  Follow Up with Dr. Elsworth Soho or Judson Roch NP in 3 months or before as needed Please contact office for sooner follow up if symptoms do not improve or worsen or seek emergency care

## 2022-05-13 ENCOUNTER — Ambulatory Visit: Payer: Medicare HMO | Admitting: Podiatry

## 2022-05-13 ENCOUNTER — Telehealth: Payer: Self-pay | Admitting: Pulmonary Disease

## 2022-05-13 MED ORDER — TRELEGY ELLIPTA 100-62.5-25 MCG/ACT IN AEPB: 1.0000 | INHALATION_SPRAY | Freq: Every day | RESPIRATORY_TRACT | 0 refills | Status: DC

## 2022-05-13 NOTE — Telephone Encounter (Signed)
Samples logged and up front. Nothing further.

## 2022-05-13 NOTE — Telephone Encounter (Signed)
Spouse requesting samples of trelegy. She states she gets paid next week and will get it from pharmacy for $171. Please advise call back [phone].

## 2022-05-14 ENCOUNTER — Other Ambulatory Visit (HOSPITAL_COMMUNITY): Payer: Self-pay

## 2022-05-21 ENCOUNTER — Ambulatory Visit (HOSPITAL_BASED_OUTPATIENT_CLINIC_OR_DEPARTMENT_OTHER)
Admission: RE | Admit: 2022-05-21 | Discharge: 2022-05-21 | Disposition: A | Discharge status: Home / Self Care | Payer: Medicare HMO | Source: Ambulatory Visit | Attending: Family Medicine | Admitting: Family Medicine

## 2022-05-21 DIAGNOSIS — Z87891 Personal history of nicotine dependence: Secondary | ICD-10-CM | POA: Insufficient documentation

## 2022-05-26 ENCOUNTER — Telehealth: Payer: Self-pay | Admitting: Acute Care

## 2022-05-26 DIAGNOSIS — R911 Solitary pulmonary nodule: Secondary | ICD-10-CM

## 2022-05-26 DIAGNOSIS — Z87891 Personal history of nicotine dependence: Secondary | ICD-10-CM

## 2022-05-26 NOTE — Telephone Encounter (Signed)
Left message for pt to call back regarding lung screening CT results.  

## 2022-05-26 NOTE — Telephone Encounter (Signed)
Spoke with patient by phone (verified using two identifiers) to discuss results of LDCT.  Atherosclerosis and emphysema, as previously noted.  Patient is on statin medication.  New lung nodule noted with recommendation to follow up in 6 months for repeat LDCT instead of 12 months, as precaution.  Patient in agreement and had no questions. Will place new order for 6 month follow up LCS CT Chest and will contact patient when time to schedule. Results with plan faxed to PCP.

## 2022-05-26 NOTE — Telephone Encounter (Signed)
See other telephone note from 05/26/22

## 2022-06-02 ENCOUNTER — Other Ambulatory Visit: Payer: Self-pay | Admitting: Student

## 2022-06-02 DIAGNOSIS — J449 Chronic obstructive pulmonary disease, unspecified: Secondary | ICD-10-CM

## 2022-06-04 ENCOUNTER — Telehealth: Payer: Self-pay

## 2022-06-04 NOTE — Telephone Encounter (Signed)
[name], Lucita Ferrara, called stating that the pt's toes on his L foot stay frozen at night.  Reviewed pt's chart, returned call for clarification, two identifiers used. She states that he began having cold toes on his L foot and pain that disrupts sleep approx a couple of days ago. It only affects him at night. During the day, he has no complaints with cold or pain. She denies any sores/open wounds, any discoloration, or any swelling. He is not very active during the day, watching TV most of the time. Pt to have 9 month f/u with studies in September. Scheduled appts earlier on 8/24. Confirmed understanding.

## 2022-06-07 ENCOUNTER — Other Ambulatory Visit: Payer: Self-pay | Admitting: Vascular Surgery

## 2022-06-08 ENCOUNTER — Telehealth: Payer: Self-pay

## 2022-06-08 NOTE — Telephone Encounter (Signed)
Received oxygen order form for [employer]. Faxed to [phone].  Talbot Grumbling, RN

## 2022-06-09 ENCOUNTER — Telehealth: Payer: Self-pay | Admitting: Pulmonary Disease

## 2022-06-09 MED ORDER — TRELEGY ELLIPTA 100-62.5-25 MCG/ACT IN AEPB: 1.0000 | INHALATION_SPRAY | Freq: Every day | RESPIRATORY_TRACT | 0 refills | Status: DC

## 2022-06-09 NOTE — Telephone Encounter (Signed)
Called and spoke to patient and advise dhim that his samples will be upfront for him at the office. Nothing further needed

## 2022-06-11 ENCOUNTER — Other Ambulatory Visit: Payer: Self-pay | Admitting: Family Medicine

## 2022-06-15 ENCOUNTER — Other Ambulatory Visit: Payer: Self-pay | Admitting: Family Medicine

## 2022-06-16 ENCOUNTER — Telehealth: Payer: Self-pay

## 2022-06-16 NOTE — Telephone Encounter (Signed)
Call & schedule pt for 09/08@2 :45 pm

## 2022-06-16 NOTE — Telephone Encounter (Signed)
-----   Message from Gerrit Heck, MD sent at 06/15/2022  9:00 AM EDT ----- Patient needs appointment scheduled before additional refills. Thank you!  Mayuri

## 2022-06-18 ENCOUNTER — Ambulatory Visit (INDEPENDENT_AMBULATORY_CARE_PROVIDER_SITE_OTHER): Payer: Medicare HMO | Admitting: Physician Assistant

## 2022-06-18 ENCOUNTER — Ambulatory Visit (HOSPITAL_COMMUNITY)
Admission: RE | Admit: 2022-06-18 | Discharge: 2022-06-18 | Disposition: A | Discharge status: Home / Self Care | Payer: Medicare HMO | Source: Ambulatory Visit | Attending: Vascular Surgery | Admitting: Vascular Surgery

## 2022-06-18 ENCOUNTER — Ambulatory Visit (INDEPENDENT_AMBULATORY_CARE_PROVIDER_SITE_OTHER)
Admission: RE | Admit: 2022-06-18 | Discharge: 2022-06-18 | Disposition: A | Discharge status: Home / Self Care | Payer: Medicare HMO | Source: Ambulatory Visit | Attending: Vascular Surgery | Admitting: Vascular Surgery

## 2022-06-18 VITALS — BP 200/85 | HR 72 | Temp 97.8°F | Resp 20 | Ht 71.0 in | Wt 160.7 lb

## 2022-06-18 DIAGNOSIS — I739 Peripheral vascular disease, unspecified: Secondary | ICD-10-CM

## 2022-06-18 MED ORDER — GABAPENTIN 300 MG PO CAPS: 300.0000 mg | ORAL_CAPSULE | Freq: Every day | ORAL | 1 refills | Status: DC

## 2022-06-18 NOTE — Progress Notes (Signed)
Office Note     CC:  follow up Requesting Provider:  Gerrit Heck, MD  HPI: Kyle [name] is a 71 y.o. (Jun 08, 1951) male who presents for surveillance of PAD.  He has also been experiencing a cold feeling and tingling in toes of bilateral lower extremities over the past month.  This sensation is keeping him up at night.  He denies any pain in his toes or feet, his main concern is of the tingling feeling.  He denies any claudication or tissue loss of bilateral lower extremities.  He is on Eliquis daily.  He is on a statin daily.  Surgical history significant for aortobifemoral bypass graft in 2016.  He has also had right femoral to popliteal bypass in 2010 which is chronically occluded.  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  Including femoral to peroneal artery bypass with vein harvested from the left leg by Dr. Donnetta Hutching.  He is also had left common femoral artery endarterectomy.    Past Medical History:  Diagnosis Date   Aortic insufficiency    MODERATE WITH A BICUSPID AORTIC VAVLE   Arterial occlusive disease    MULTILEVEL   CHF (congestive heart failure) (HCC)    COPD (chronic obstructive pulmonary disease) ([place])    Dilated cardiomyopathy ([place])    WITH EJECTION FRACTION DOWN TO 20-25%--WITH CONGESTIVE HEART FAILURE   Edema    LOWER EXTREMETIES   Hyperglycemia 01/2020   Hypertension    Normal coronary arteries 2009   Orthopnea    Peripheral arterial disease ([place])    Pulmonary hypertension ([place])     Past Surgical History:  Procedure Laterality Date   ABDOMINAL AORTAGRAM N/A 11/05/2014   Procedure: ABDOMINAL Maxcine Ham;  Surgeon: Angelia Mould, MD;  Location[facility] C[facility] LAB;  Service: Cardiovascular;  Laterality: N/A;   ABDOMINAL AORTOGRAM W/LOWER EXTREMITY Bilateral 04/21/2019   Procedure: ABDOMINAL AORTOGRAM W/LOWER EXTREMITY;  Surgeon: Angelia Mould, MD;  Location: [place] CV LAB;  Service: Cardiovascular;  Laterality: Bilateral;   ABDOMINAL AORTOGRAM W/LOWER EXTREMITY Right 05/30/2021   Procedure: ABDOMINAL AORTOGRAM W/LOWER EXTREMITY;  Surgeon: Angelia Mould, MD;  Location: [place] CV LAB;  Service: Cardiovascular;  Laterality: Right;   AORTA - BILATERAL FEMORAL ARTERY BYPASS GRAFT N/A 12/18/2014   Procedure: AORTOBIFEMORAL BYPASS GRAFT;  Surgeon: Angelia Mould, MD;  Location: [place];  Service: Vascular;  Laterality: N/A;   CARDIAC CATHETERIZATION  2009   Nl Cors, EF 20%   COLONOSCOPY     ENDARTERECTOMY FEMORAL Left 12/18/2014   Procedure: ENDARTERECTOMY FEMORAL;  Surgeon: Angelia Mould, MD;  Location: [place];  Service: Vascular;  Laterality: Left;   FALSE ANEURYSM REPAIR Right 04/24/2019   Procedure: REPAIR OF RIGHT FEMORAL ARTERY PSEUDOANEURYSM;  Surgeon: Rosetta Posner, MD;  Location: [facility] OR;  Service: Vascular;  Laterality: Right;   FEMORAL-POPLITEAL BYPASS GRAFT  02/21/2011   right   FEMORAL-TIBIAL BYPASS GRAFT Right 04/24/2019   Procedure: REDO BYPASS GRAFT RIGHT FEMORAL-TIBIAL ARTERY USING LEFT LEG VEIN & HEMASHIELD GOLD 31mm GRAFT;  Surgeon: Rosetta Posner, MD;  Location: [facility] OR;  Service: Vascular;  Laterality: Right;   IR FLUORO GUIDE CV LINE RIGHT  12/28/2019   IR REMOVAL TUN CV CATH W/O FL  01/11/2020   IR US GUIDE VASC ACCESS RIGHT  12/28/2019   OTHER SURGICAL HISTORY     POST RIGHT FEMOROPOPLITEAL BYPASS GRAFT  PERIPHERAL VASCULAR CATHETERIZATION  11/05/2014   Procedure: LOWER EXTREMITY ANGIOGRAPHY;  Surgeon: Angelia Mould, MD;  Location: [facility] CATH LAB;  Service: Cardiovascular;;   VEIN HARVEST Left 04/24/2019   Procedure: LEFT LEG SAPHENOUS VEIN HARVEST;  Surgeon: Rosetta Posner, MD;  Location: [facility] OR;  Service: Vascular;  Laterality: Left;    Social History   Socioeconomic History   Marital status: Married    Spouse name: Not on file   Number of children: 1   Years of education:  Not on file   Highest education level: Not on file  Occupational History   Occupation: retired    Fish farm manager: [employer]  Tobacco Use   Smoking status: Former    Packs/day: 1.00    Years: 40.00    Total pack years: 40.00    Types: Cigarettes    Quit date: 10/15/2018    Years since quitting: 3.6    Passive exposure: Never   Smokeless tobacco: Never   Tobacco comments:    quit 2010 and quit again 2019  Vaping Use   Vaping Use: Never used  Substance and Sexual Activity   Alcohol use: No    Alcohol/week: 0.0 standard drinks of alcohol   Drug use: No   Sexual activity: Not on file  Other Topics Concern   Not on file  Social History Narrative   Not on file   Social Determinants of Health   Financial Resource Strain: Not on file  Food Insecurity: Not on file  Transportation Needs: Not on file  Physical Activity: Not on file  Stress: Not on file  Social Connections: Not on file  Intimate Partner Violence: Not on file    Family History  Problem Relation Age of Onset   Diabetes Mother    Hyperlipidemia Sister    Hypertension Sister     Current Outpatient Medications  Medication Sig Dispense Refill   Accu-Chek FastClix Lancets MISC USE TO CHECK GLUCOSE AS INDICATED 204 each 0   albuterol (VENTOLIN HFA) 108 (90 Base) MCG/ACT inhaler Inhale 2 puffs into the lungs every 6 (six) hours as needed for wheezing or shortness of breath. 8 g 2   apixaban (ELIQUIS) 5 MG TABS tablet Take 1 tablet (5 mg total) by mouth 2 (two) times daily. 180 tablet 3   carvedilol (COREG) 6.25 MG tablet TAKE 1 TABLET TWICE DAILY WITH MEALS (NEED MD APPOINTMENT) 180 tablet 3   Fluticasone-Umeclidin-Vilant (TRELEGY ELLIPTA) 100-62.5-25 MCG/ACT AEPB Inhale 1 puff into the lungs daily. 1 each 0   furosemide (LASIX) 40 MG tablet Take 0.5 tablets (20 mg total) by mouth daily. 45 tablet 3   gabapentin (NEURONTIN) 300 MG capsule Take 1 capsule (300 mg total) by mouth daily. 30 capsule 1   glucose blood  (ACCU-CHEK GUIDE) test strip TEST BLOOD SUGAR TWICE DAILY 200 strip 2   guaiFENesin (MUCINEX) 600 MG 12 hr tablet Take 2 tablets (1,200 mg total) by mouth 2 (two) times daily. (Patient taking differently: Take 600 mg by mouth 2 (two) times daily.) 30 tablet 0   hydrALAZINE (APRESOLINE) 25 MG tablet TAKE 1 TABLET THREE TIMES DAILY (NEED APPOINTMENT WITH PCP FOR ANOTHER REFILL) 90 tablet 0   insulin aspart (NOVOLOG) 100 UNIT/ML FlexPen Inject 5 Units into the skin 3 (three) times daily with meals. 30 mL 0   insulin detemir (LEVEMIR) 100 unit/ml SOLN Inject 28 Units into the skin in the morning.     Insulin Pen Needle 32G X 4 MM MISC Use as  directed with insulin pen Strength: 32G X 4 MM 200 each 1   isosorbide mononitrate (IMDUR) 30 MG 24 hr tablet TAKE 1 TABLET BY MOUTH ONCE DAILY. 90 tablet 3   levETIRAcetam (KEPPRA) 500 MG tablet TAKE 1 TABLET DAILY. MUST HAVE APPOINTMENT PRIOR TO NEXT REFILL. 30 tablet 0   Multiple Vitamins-Minerals (CENTRUM SILVER 50+MEN) TABS Take 1 tablet by mouth daily with breakfast.     OXYGEN Inhale 4.5 L/min into the lungs continuous.     rosuvastatin (CRESTOR) 10 MG tablet TAKE 1 TABLET AT BEDTIME 90 tablet 0   No current facility-administered medications for this visit.    No Known Allergies   REVIEW OF SYSTEMS:   [X]  denotes positive finding, [ ]  denotes negative finding Cardiac  Comments:  Chest pain or chest pressure:    Shortness of breath upon exertion:    Short of breath when lying flat:    Irregular heart rhythm:        Vascular    Pain in calf, thigh, or hip brought on by ambulation:    Pain in feet at night that wakes you up from your sleep:     Blood clot in your veins:    Leg swelling:         Pulmonary    Oxygen at home:    Productive cough:     Wheezing:         Neurologic    Sudden weakness in arms or legs:     Sudden numbness in arms or legs:     Sudden onset of difficulty speaking or slurred speech:    Temporary loss of vision in  one eye:     Problems with dizziness:         Gastrointestinal    Blood in stool:     Vomited blood:         Genitourinary    Burning when urinating:     Blood in urine:        Psychiatric    Major depression:         Hematologic    Bleeding problems:    Problems with blood clotting too easily:        Skin    Rashes or ulcers:        Constitutional    Fever or chills:      PHYSICAL EXAMINATION:  Vitals:   06/18/22 1235  BP: (!) 200/85  Pulse: 72  Resp: 20  Temp: 97.8 F (36.6 C)  TempSrc: Temporal  SpO2: 96%  Weight: 160 lb 11.2 oz (72.9 kg)  Height: 5\' 11"  (1.803 m)    General:  WDWN in NAD; vital signs documented above Gait: Not observed HENT: WNL, normocephalic Pulmonary: normal non-labored breathing , without Rales, rhonchi,  wheezing Cardiac: regular HR Abdomen: soft, NT, no masses Skin: without rashes Vascular Exam/Pulses:  Right Left  Femoral 2+ (normal) 2+ (normal)  DP 1+ (weak) absent  PT absent absent   Extremities: without ischemic changes, without Gangrene , without cellulitis; without open wounds;  Musculoskeletal: no muscle wasting or atrophy  Neurologic: A&O X 3;  No focal weakness or paresthesias are detected Psychiatric:  The pt has Normal affect.   Non-Invasive Vascular Imaging:   Right leg bypass patent with stenosis of the distal anastomosis however this was present on angiogram in August 2022  ABI/TBIToday's ABIToday's TBIPrevious ABIPrevious TBI  +-------+-----------+-----------+------------+------------+  Right  0.68       0.66  0.85        0.67          +-------+-----------+-----------+------------+------------+  Left   0.44       0.49       0.62        0.50          +-------+-----------+-----------+------------+------------+    ASSESSMENT/PLAN:: 71 y.o. male here for surveillance of PAD as well as 1 month history of numbness and tingling in both feet at night  -Right leg arterial duplex  demonstrates a patent bypass graft.  He has distal anastomosis elevated velocities however these have been consistent since his arteriogram in August of last year which did not demonstrate any significant stenosis.  His symptoms in his feet are symmetrical.  Given that his vascular imaging studies have not changed I believe this is a product of neuropathy.  He will be started on 300 mg gabapentin to take before bed.  I have given him a month supply with 1 refill.  If he continues this medication I would ask his PCP to monitor renal function in conjunction with future refills.  He will follow-up for surveillance in another 9 months with repeat imaging studies.   [name], PA-C [facility] [phone]  Clinic MD:   Scot Dock

## 2022-06-19 ENCOUNTER — Ambulatory Visit: Payer: Medicare HMO | Admitting: Podiatry

## 2022-06-22 ENCOUNTER — Telehealth: Payer: Self-pay | Admitting: Cardiology

## 2022-06-22 NOTE — Telephone Encounter (Signed)
Patient calling the office for samples of medication:   1.  What medication and dosage are you requesting samples for? apixaban (ELIQUIS) 5 MG TABS tablet  2.  Are you currently out of this medication? One more dose left.

## 2022-06-22 NOTE — Telephone Encounter (Signed)
Ok to give Eliquis 5 mg samples if available.

## 2022-06-22 NOTE — Telephone Encounter (Signed)
Samples placed at front desk, wife aware.

## 2022-06-23 ENCOUNTER — Other Ambulatory Visit: Payer: Self-pay

## 2022-06-23 DIAGNOSIS — I739 Peripheral vascular disease, unspecified: Secondary | ICD-10-CM

## 2022-06-30 ENCOUNTER — Ambulatory Visit: Payer: Medicare HMO | Admitting: Podiatry

## 2022-07-02 NOTE — Progress Notes (Deleted)
    SUBJECTIVE:   CHIEF COMPLAINT / HPI:   ***  PERTINENT  PMH / PSH: ***  OBJECTIVE:   There were no vitals taken for this visit.  ***  ASSESSMENT/PLAN:   No problem-specific Assessment & Plan notes found for this encounter.     Mathan Darroch, MD [facility]  

## 2022-07-03 ENCOUNTER — Ambulatory Visit: Payer: Medicare HMO | Admitting: Student

## 2022-07-03 ENCOUNTER — Ambulatory Visit (INDEPENDENT_AMBULATORY_CARE_PROVIDER_SITE_OTHER): Payer: Medicare HMO | Admitting: Podiatry

## 2022-07-03 ENCOUNTER — Encounter: Payer: Self-pay | Admitting: Podiatry

## 2022-07-03 DIAGNOSIS — I739 Peripheral vascular disease, unspecified: Secondary | ICD-10-CM

## 2022-07-03 DIAGNOSIS — L84 Corns and callosities: Secondary | ICD-10-CM

## 2022-07-03 DIAGNOSIS — D689 Coagulation defect, unspecified: Secondary | ICD-10-CM | POA: Diagnosis not present

## 2022-07-03 DIAGNOSIS — B351 Tinea unguium: Secondary | ICD-10-CM

## 2022-07-03 DIAGNOSIS — M79675 Pain in left toe(s): Secondary | ICD-10-CM | POA: Diagnosis not present

## 2022-07-03 DIAGNOSIS — M79674 Pain in right toe(s): Secondary | ICD-10-CM | POA: Diagnosis not present

## 2022-07-03 DIAGNOSIS — E1142 Type 2 diabetes mellitus with diabetic polyneuropathy: Secondary | ICD-10-CM

## 2022-07-03 NOTE — Progress Notes (Signed)
This patient returns to my office for at risk foot care.  This patient requires this care by a professional since this patient will be at risk due to having diabetes,PAD, kidney disease, and pvd. This patient is unable to cut nails himself since the patient cannot reach his nails.These nails are painful walking and wearing shoes.  This patient presents for at risk foot care today.  General Appearance  Alert, conversant and in no acute stress.  Vascular  Dorsalis pedis and posterior tibial  pulses are weakly  palpable  bilaterally.  Capillary return is within normal limits  bilaterally. Temperature is within normal limits  bilaterally.  Neurologic  Senn-Weinstein monofilament wire test within normal limits  bilaterally. Muscle power within normal limits bilaterally.  Nails Thick disfigured discolored nails with subungual debris  from hallux to fifth toes bilaterally. No evidence of bacterial infection or drainage bilaterally.  Orthopedic  No limitations of motion  feet .  No crepitus or effusions noted.  No bony pathology or digital deformities noted.  Skin  normotropic skin with no porokeratosis noted bilaterally.  No signs of infections or ulcers noted.     Onychomycosis  Pain in right toes  Pain in left toes  Consent was obtained for treatment procedures.   Mechanical debridement of nails 1-5  bilaterally performed with a nail nipper.  Filed with dremel without incident.    Return office visit     3 months to Dr.  Adah Perl.                Told patient to return for periodic foot care and evaluation due to potential at risk complications.   Gardiner Barefoot DPM

## 2022-07-06 ENCOUNTER — Telehealth: Payer: Self-pay | Admitting: Cardiology

## 2022-07-06 NOTE — Telephone Encounter (Signed)
Eliquis refill cleared by M. Dapp, RN in pharmacy. Wife will come for samples of eliquis 5mg  twice daily.

## 2022-07-06 NOTE — Telephone Encounter (Signed)
Patient calling the office for samples of medication:   1.  What medication and dosage are you requesting samples for? apixaban (ELIQUIS) 5 MG TABS tablet  2.  Are you currently out of this medication? Took last dose today. Pt states that she is still waiting for her unemployment and they can not afford this still currently. She is requesting help a little long on this medication with sample.

## 2022-07-08 ENCOUNTER — Telehealth: Payer: Self-pay | Admitting: Pulmonary Disease

## 2022-07-08 NOTE — Telephone Encounter (Signed)
Patient's wife called back to check on status

## 2022-07-08 NOTE — Telephone Encounter (Signed)
Patient's wife calling requesting samples of trelegy. Patient is unable to get medication until unemployment check.  Please advise on samples, call back at [phone].

## 2022-07-10 NOTE — Telephone Encounter (Signed)
Attempted to call pt's spouse Lucita Ferrara but unable to reach and unable to leave a VM as no VM kicked in. Will try to call back later.

## 2022-07-13 NOTE — Telephone Encounter (Signed)
No samples of triligy noted at office at this time. Called and let patient know. Nothing further needed

## 2022-07-27 NOTE — Telephone Encounter (Signed)
Spoke with spouse who will pick up eliquis 5mg  samples (approved by [facility]) today.

## 2022-07-27 NOTE — Telephone Encounter (Signed)
Patient calling the office for samples of medication:   1.  What medication and dosage are you requesting samples for? apixaban (ELIQUIS) 5 MG TABS tablet  2.  Are you currently out of this medication? Pt took his last dose this morning, but needs more samples of this. Her unemployment is still not in effect.

## 2022-08-05 ENCOUNTER — Ambulatory Visit: Payer: Medicare HMO | Admitting: Pulmonary Disease

## 2022-08-07 ENCOUNTER — Other Ambulatory Visit: Payer: Self-pay | Admitting: Cardiology

## 2022-08-07 ENCOUNTER — Other Ambulatory Visit: Payer: Self-pay

## 2022-08-07 DIAGNOSIS — I482 Chronic atrial fibrillation, unspecified: Secondary | ICD-10-CM

## 2022-08-07 NOTE — Telephone Encounter (Signed)
Eliquis 5mg  refill request received. Patient is 71 years old, weight-72.9kg, Crea-1.65 on 02/12/2022, Diagnosis-Afib, and last seen by Dr. Martinique on 02/12/2022. Dose is appropriate based on dosing criteria. Will send in refill to requested pharmacy.

## 2022-08-12 ENCOUNTER — Other Ambulatory Visit: Payer: Self-pay | Admitting: Student

## 2022-08-13 ENCOUNTER — Telehealth: Payer: Self-pay | Admitting: Acute Care

## 2022-08-13 ENCOUNTER — Other Ambulatory Visit: Payer: Self-pay | Admitting: Student

## 2022-08-13 DIAGNOSIS — E119 Type 2 diabetes mellitus without complications: Secondary | ICD-10-CM

## 2022-08-13 MED ORDER — LEVETIRACETAM 500 MG PO TABS: ORAL_TABLET | ORAL | 0 refills | Status: DC

## 2022-08-13 MED ORDER — INSULIN PEN NEEDLE 32G X 4 MM MISC: 1 refills | Status: DC

## 2022-08-13 MED ORDER — HYDRALAZINE HCL 25 MG PO TABS
ORAL_TABLET | ORAL | 0 refills | Status: DC
Start: 2022-08-13 — End: 2022-11-26

## 2022-08-13 NOTE — Progress Notes (Signed)
Called and spoke with wife Nykolas Bacallao. Confirmed DOB. Discussed reason for why patient is on Keppra. In the past it seems he was referred to neurology to understand why but has not had appointment. No hx of seizures. She says he was started on it while he was in the hospital a while ago and still continues this. She also says he does hydralazine TID. I refilled these but said he needs an appointment to go through his medications more in depth.

## 2022-08-13 NOTE — Telephone Encounter (Signed)
Called Kyle [name] back to go over [employer] paperwork. She states she will bring paperwork back to office after she goes to pharmacy to get print out for application. Nothing further needed

## 2022-08-19 ENCOUNTER — Telehealth: Payer: Self-pay | Admitting: Cardiology

## 2022-08-19 NOTE — Telephone Encounter (Signed)
Wife stated she is completing an application for assistance for her husband and she would like to know if she should only include her husband's income on the application.

## 2022-08-19 NOTE — Telephone Encounter (Signed)
Wife advised to include household income.  Wife verbalized understanding.

## 2022-08-20 ENCOUNTER — Other Ambulatory Visit: Payer: Self-pay | Admitting: *Deleted

## 2022-08-20 ENCOUNTER — Other Ambulatory Visit: Payer: Self-pay

## 2022-08-20 ENCOUNTER — Telehealth: Payer: Self-pay

## 2022-08-20 ENCOUNTER — Telehealth: Payer: Self-pay | Admitting: Pulmonary Disease

## 2022-08-20 DIAGNOSIS — I482 Chronic atrial fibrillation, unspecified: Secondary | ICD-10-CM

## 2022-08-20 MED ORDER — APIXABAN 5 MG PO TABS: 5.0000 mg | ORAL_TABLET | Freq: Two times a day (BID) | ORAL | 3 refills | Status: DC

## 2022-08-20 MED ORDER — TRELEGY ELLIPTA 100-62.5-25 MCG/ACT IN AEPB: 1.0000 | INHALATION_SPRAY | Freq: Every day | RESPIRATORY_TRACT | 5 refills | Status: DC

## 2022-08-20 MED ORDER — TRELEGY ELLIPTA 100-62.5-25 MCG/ACT IN AEPB: 1.0000 | INHALATION_SPRAY | Freq: Every day | RESPIRATORY_TRACT | 0 refills | Status: DC

## 2022-08-20 NOTE — Telephone Encounter (Signed)
Routing to [place] as an FYI so she is made aware about the paperwork.

## 2022-08-20 NOTE — Telephone Encounter (Signed)
Patient assistance form for Eliquis completed and faxed to [employer] at fax # [phone].

## 2022-08-26 ENCOUNTER — Encounter: Payer: Self-pay | Admitting: Pharmacist

## 2022-08-26 ENCOUNTER — Other Ambulatory Visit: Payer: Self-pay | Admitting: Physician Assistant

## 2022-08-26 MED ORDER — TRELEGY ELLIPTA 100-62.5-25 MCG/ACT IN AEPB: 1.0000 | INHALATION_SPRAY | Freq: Every day | RESPIRATORY_TRACT | 3 refills | Status: DC

## 2022-08-26 MED ORDER — GABAPENTIN 300 MG PO CAPS: 300.0000 mg | ORAL_CAPSULE | Freq: Every day | ORAL | 1 refills | Status: DC

## 2022-08-26 NOTE — Progress Notes (Signed)
Prepared Levemir sample for patient pick-up.   Medication Samples have been provided to the patient.  Drug name: Levemir       Strength: 100units/ml        Qty: 5 pens  LOT: [id number]  Exp.Date: 09/24/2024  Dosing instructions: 28 units QAM  The patient has been instructed regarding the correct time, dose, and frequency of taking this medication, including desired effects and most common side effects.   Janeann Forehand 12:13 PM 08/26/2022

## 2022-08-26 NOTE — Telephone Encounter (Signed)
Located forms in RA's faxed folder.   Called and spoke with Butch Penny at [employer]. She confirmed they received the application but it was missing the prescription and signature on the pharmacy print out. Once they have both of these documents, they will be able to approve the patient.   Called and spoke with patient's spouse Kyle [name]. She will stop by the pharmacy to get the pharmacy printout signed by the pharmacist and bring it to the office.   Will leave this encounter open for follow up.

## 2022-08-27 MED ORDER — TRELEGY ELLIPTA 100-62.5-25 MCG/ACT IN AEPB: 1.0000 | INHALATION_SPRAY | Freq: Every day | RESPIRATORY_TRACT | 0 refills | Status: DC

## 2022-08-27 NOTE — Telephone Encounter (Signed)
Spoke with patient's wife in the lobby. She provided the signed pharmacy report from [facility]. I advised her that I would fax the report today. She also stated that the patient only had enough of Trelegy to last for another week. I provided her with 2 samples to make sure the patient does not run out. I also advised her that I would call [facility] to follow up on the application this afternoon or tomorrow morning, she verbalized understanding.   Will keep this encounter open for follow up.

## 2022-08-28 ENCOUNTER — Ambulatory Visit: Payer: Medicare HMO | Admitting: Nurse Practitioner

## 2022-08-28 ENCOUNTER — Encounter: Payer: Self-pay | Admitting: Nurse Practitioner

## 2022-08-28 VITALS — BP 120/80 | Ht 71.0 in | Wt 167.6 lb

## 2022-08-28 DIAGNOSIS — J9611 Chronic respiratory failure with hypoxia: Secondary | ICD-10-CM

## 2022-08-28 DIAGNOSIS — I482 Chronic atrial fibrillation, unspecified: Secondary | ICD-10-CM

## 2022-08-28 DIAGNOSIS — J439 Emphysema, unspecified: Secondary | ICD-10-CM

## 2022-08-28 DIAGNOSIS — J4489 Other specified chronic obstructive pulmonary disease: Secondary | ICD-10-CM | POA: Diagnosis not present

## 2022-08-28 NOTE — Assessment & Plan Note (Signed)
Severe obstruction with high symptom burden. Compensated on current regimen. He will continue triple therapy regimen with Trelegy. Activity encouraged, as tolerated.  Patient Instructions  Continue Trelegy 1 puff daily. Brush tongue and rinse mouth afterwards Continue Albuterol inhaler 2 puffs every 6 hours as needed for shortness of breath or wheezing. Notify if symptoms persist despite rescue inhaler/neb use.  Continue supplemental oxygen 4 lpm at all times for goal >88-90% Continue mucinex 1200 mg Twice daily as needed for chest congestion   Follow up in 4 months with Dr. Elsworth Soho. If symptoms worsen, please contact office for sooner follow up or seek emergency care.

## 2022-08-28 NOTE — Progress Notes (Signed)
@Patient  ID: Kyle [name], male    DOB: [birth year], 71 y.o.   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 He is a patient of Dr. Bari Mantis and last seen in office 05/05/2022 by Elie Confer, NP. Past medical history significant for cardiomyopathy, HFrEF, PAD s/p right femoral peroneal bypass, PAF on Eliquis, CKD stage III, DM II, HLD.   TEST/EVENTS:  12/16/2018 spirometry: FVC 43%, FEV1 36%, ratio 64. Severe obstructive defect 02/2020 LDCT: Lung RADS 2. Moderate to advanced emphysema. Tiny nodule left base at 4.7 mm 04/2021 LDCT: RLL nodule unchanged, suspected PAH  05/05/2022: OV with [place] NP for follow up. Doing well recently. Compliant with Trelegy. Uses rescue maybe once a day. He has a concentrator for oxygen at home but needs better options for portable devices. Portable tanks are too heavy so he doesn't wear his oxygen when he leaves the house. Previously tried on POC but unable to maintain saturations. Uses 4 lpm, day and night. Follow up with [facility] on other lighter options. Attend lung cancer screening CT 05/21/2022.  08/28/2022: Today - follow up Patient presents today for follow up. He has been doing well since he was here last. Has not required any steroids or abx for his breathing. No recent hospitalizations. He doesn't feel like his breathing impacts his daily activities. He can get dressed, shower, and prepare food without difficulty. He does get winded with walking longer distances and uphill climbing. No significant cough or chest congestion. He uses Trelegy daily. Uses rescue maybe once a day. He arrived to the visit with oxygen saturations in the 70's on room air; recovered quickly with application of his baseline 4 lpm. He says he does wear his oxygen at all times when he's at home but he  doesn't like taking it with him when he goes out. He got some lighter portable oxygen options this week. He will try to use these when he goes out.   No Known Allergies  Immunization History  Administered Date(s) Administered   Fluad Quad(high Dose 65+) 07/14/2019, 07/23/2020   Influenza-Unspecified 01/26/2017, 08/10/2017, 08/21/2019   Moderna Sars-Covid-2 Vaccination 02/10/2020, 03/09/2020, 09/30/2020   PFIZER Comirnaty(Gray Top)Covid-19 Tri-Sucrose Vaccine 05/02/2021   Pneumococcal Conjugate-13 01/20/2018   Pneumococcal Polysaccharide-23 07/14/2019    Past Medical History:  Diagnosis Date   Aortic insufficiency    MODERATE WITH A BICUSPID AORTIC VAVLE   Arterial occlusive disease    MULTILEVEL   CHF (congestive heart failure) (HCC)    COPD (chronic obstructive pulmonary disease) ([place])    Dilated cardiomyopathy ([place])    WITH EJECTION FRACTION DOWN TO 20-25%--WITH CONGESTIVE HEART FAILURE   Edema    LOWER EXTREMETIES   Hyperglycemia 01/2020   Hypertension    Normal coronary arteries 2009   Orthopnea    Peripheral arterial disease ([place])    Pulmonary hypertension ([place])     Tobacco History: Social History   Tobacco Use  Smoking Status Former   Packs/day: 1.00   Years: 40.00   Total pack years: 40.00   Types: Cigarettes   Quit date: 10/15/2018   Years since quitting: 3.8   Passive exposure: Never  Smokeless Tobacco Never  Tobacco Comments   quit 2010 and quit again 2019   Counseling given: Not Answered Tobacco comments: quit 2010 and quit again 2019  Outpatient Medications Prior to Visit  Medication Sig Dispense Refill   albuterol (VENTOLIN HFA) 108 (90 Base) MCG/ACT inhaler Inhale 2 puffs into the lungs every 6 (six) hours as needed for wheezing or shortness of breath. 8 g 2   Fluticasone-Umeclidin-Vilant (TRELEGY ELLIPTA) 100-62.5-25 MCG/ACT AEPB Inhale 1 puff into the lungs daily. 2 each 0   guaiFENesin (MUCINEX) 600 MG 12 hr tablet Take 2 tablets (1,200 mg  total) by mouth 2 (two) times daily. (Patient taking differently: Take 600 mg by mouth 2 (two) times daily.) 30 tablet 0   Accu-Chek FastClix Lancets MISC USE TO CHECK GLUCOSE AS INDICATED 204 each 0   apixaban (ELIQUIS) 5 MG TABS tablet Take 1 tablet (5 mg total) by mouth 2 (two) times daily. 180 tablet 3   carvedilol (COREG) 6.25 MG tablet TAKE 1 TABLET TWICE DAILY WITH MEALS (NEED MD APPOINTMENT) 180 tablet 3   Fluticasone-Umeclidin-Vilant (TRELEGY ELLIPTA) 100-62.5-25 MCG/ACT AEPB Inhale 1 puff into the lungs daily. 14 each 0   Fluticasone-Umeclidin-Vilant (TRELEGY ELLIPTA) 100-62.5-25 MCG/ACT AEPB Inhale 1 puff into the lungs daily. 60 each 5   Fluticasone-Umeclidin-Vilant (TRELEGY ELLIPTA) 100-62.5-25 MCG/ACT AEPB Inhale 1 puff into the lungs daily. 3 each 3   furosemide (LASIX) 40 MG tablet Take 0.5 tablets (20 mg total) by mouth daily. 45 tablet 3   gabapentin (NEURONTIN) 300 MG capsule Take 1 capsule (300 mg total) by mouth daily. 7 capsule 1   glucose blood (ACCU-CHEK GUIDE) test strip TEST BLOOD SUGAR TWICE DAILY 200 strip 2   hydrALAZINE (APRESOLINE) 25 MG tablet TAKE 1 TABLET THREE TIMES DAILY (NEED APPOINTMENT WITH PCP FOR ANOTHER REFILL) 90 tablet 0   insulin aspart (NOVOLOG) 100 UNIT/ML FlexPen Inject 5 Units into the skin 3 (three) times daily with meals. 30 mL 0   insulin detemir (LEVEMIR) 100 unit/ml SOLN Inject 28 Units into the skin in the morning.     Insulin Pen Needle 32G X 4 MM MISC Use as directed with insulin pen Strength: 32G X 4 MM 200 each 1   isosorbide mononitrate (IMDUR) 30 MG 24 hr tablet TAKE 1 TABLET BY MOUTH ONCE DAILY. 90 tablet 3   levETIRAcetam (KEPPRA) 500 MG tablet TAKE 1 TABLET DAILY. MUST HAVE APPOINTMENT PRIOR TO NEXT REFILL. 30 tablet 0   Multiple Vitamins-Minerals (CENTRUM SILVER 50+MEN) TABS Take 1 tablet by mouth daily with breakfast.     OXYGEN Inhale 4.5 L/min into the lungs continuous.     rosuvastatin (CRESTOR) 10 MG tablet TAKE 1 TABLET AT  BEDTIME 90 tablet 0   No facility-administered medications prior to visit.     Review of Systems:   Constitutional: No weight loss or gain, night sweats, fevers, chills, fatigue, or lassitude. HEENT: No headaches, difficulty swallowing, tooth/dental problems, or sore throat. No sneezing, itching, ear ache, nasal congestion, or post nasal drip CV:  No chest pain, orthopnea, PND, swelling in lower extremities, anasarca, dizziness, palpitations, syncope Resp: +shortness of breath with exertion. No excess mucus or change in color of mucus. No productive or non-productive. No hemoptysis. No wheezing.  No chest wall deformity GI:  No heartburn, indigestion, abdominal pain, nausea, vomiting, diarrhea, change in bowel habits, loss of appetite, bloody stools.  MSK:  No joint pain or swelling.  No decreased range of motion.  No back pain. Neuro: No dizziness or lightheadedness.  Psych: No depression or anxiety. Mood stable.     Physical Exam:  BP 120/80 (BP Location: Right Arm)   Ht 5\' 11"  (  1.803 m)   Wt 167 lb 9.6 oz (76 kg)   BMI 23.38 kg/m   GEN: Pleasant, interactive, well-developed; in no acute distress. HEENT:  Normocephalic and atraumatic. PERRLA. Sclera white. Nasal turbinates pink, moist and patent bilaterally. No rhinorrhea present. Oropharynx pink and moist, without exudate or edema. No lesions, ulcerations, or postnasal drip.  NECK:  Supple w/ fair ROM. No JVD present. Normal carotid impulses w/o bruits. Thyroid symmetrical with no goiter or nodules palpated. No lymphadenopathy.   CV: Irregular rhythm, rate controlled, no m/r/g, no peripheral edema. Pulses intact, +2 bilaterally. No cyanosis, pallor or clubbing. PULMONARY:  Unlabored, regular breathing. Diminished bilaterally A&P w/o wheezes/rales/rhonchi. No accessory muscle use.  GI: BS present and normoactive. Soft, non-tender to palpation. No organomegaly or masses detected.  MSK: No erythema, warmth or tenderness. Cap refil <2  sec all extrem. No deformities or joint swelling noted.  Neuro: A/Ox3. No focal deficits noted.   Skin: Warm, no lesions or rashe Psych: Normal affect and behavior. Judgement and thought content appropriate.     Lab Results:  CBC    Component Value Date/Time   WBC 6.5 02/12/2022 0943   WBC 7.6 05/06/2021 1946   RBC 4.73 02/12/2022 0943   RBC 5.50 05/06/2021 1946   HGB 14.8 02/12/2022 0943   HCT 45.0 02/12/2022 0943   PLT 174 02/12/2022 0943   MCV 95 02/12/2022 0943   MCH 31.3 02/12/2022 0943   MCH 29.6 05/06/2021 1946   MCHC 32.9 02/12/2022 0943   MCHC 31.3 05/06/2021 1946   RDW 15.0 02/12/2022 0943   LYMPHSABS 1.5 02/12/2022 0943   MONOABS 0.6 05/06/2021 1946   EOSABS 0.1 02/12/2022 0943   BASOSABS 0.0 02/12/2022 0943    BMET    Component Value Date/Time   NA 141 02/12/2022 0943   K 4.5 02/12/2022 0943   CL 101 02/12/2022 0943   CO2 24 02/12/2022 0943   GLUCOSE 220 (H) 02/12/2022 0943   GLUCOSE 58 (L) 05/30/2021 0658   BUN 17 02/12/2022 0943   CREATININE 1.65 (H) 02/12/2022 0943   CREATININE 0.99 11/12/2014 1344   CALCIUM 9.5 02/12/2022 0943   GFRNONAA 39 (L) 05/06/2021 1946   GFRAA 46 (L) 02/23/2020 0421    BNP    Component Value Date/Time   BNP 1,899.5 (H) 04/07/2021 1100   BNP 997.9 (H) 11/09/2014 1333     Imaging:  No results found.        No data to display          No results found for: "NITRICOXIDE"      Assessment & Plan:   COPD with chronic bronchitis and emphysema (HCC) Severe obstruction with high symptom burden. Compensated on current regimen. He will continue triple therapy regimen with Trelegy. Activity encouraged, as tolerated.  Patient Instructions  Continue Trelegy 1 puff daily. Brush tongue and rinse mouth afterwards Continue Albuterol inhaler 2 puffs every 6 hours as needed for shortness of breath or wheezing. Notify if symptoms persist despite rescue inhaler/neb use.  Continue supplemental oxygen 4 lpm at all  times for goal >88-90% Continue mucinex 1200 mg Twice daily as needed for chest congestion   Follow up in 4 months with Dr. Elsworth Soho. If symptoms worsen, please contact office for sooner follow up or seek emergency care.    Chronic respiratory failure with hypoxia (HCC) Hypoxic to the 70's upon arrival on room air. Recovered quickly when placed on 4 lpm supplemental O2, which is his baseline. He doesn't like to  take his oxygen when he goes out of the house. We had a lengthy discussion surrounding risks of untreated hypoxia. Encouraged him to use his new tanks when he goes out of the house. He verbalized understanding. Goal >88-90%  Atrial fibrillation, chronic (HCC) Rate controlled on BB. Chronically anticoagulated with Eliquis. No bleeding or excessive bruising. Aware of emergency precautions with anticoagulation therapy. Follow up with cardiology as scheduled.    I spent 28 minutes of dedicated to the care of this patient on the date of this encounter to include pre-visit review of records, face-to-face time with the patient discussing conditions above, post visit ordering of testing, clinical documentation with the electronic health record, making appropriate referrals as documented, and communicating necessary findings to members of the patients care team.  Clayton Bibles, NP 08/28/2022  Pt aware and understands NP's role.

## 2022-08-28 NOTE — Assessment & Plan Note (Addendum)
Hypoxic to the 70's upon arrival on room air. Recovered quickly when placed on 4 lpm supplemental O2, which is his baseline. He doesn't like to take his oxygen when he goes out of the house. We had a lengthy discussion surrounding risks of untreated hypoxia. Encouraged him to use his new tanks when he goes out of the house. He verbalized understanding. Goal >88-90%

## 2022-08-28 NOTE — Patient Instructions (Addendum)
Continue Trelegy 1 puff daily. Brush tongue and rinse mouth afterwards Continue Albuterol inhaler 2 puffs every 6 hours as needed for shortness of breath or wheezing. Notify if symptoms persist despite rescue inhaler/neb use.  Continue supplemental oxygen 4 lpm at all times for goal >88-90% Continue mucinex 1200 mg Twice daily as needed for chest congestion   Follow up in 4 months with Dr. Elsworth Soho. If symptoms worsen, please contact office for sooner follow up or seek emergency care.

## 2022-08-28 NOTE — Assessment & Plan Note (Signed)
Rate controlled on BB. Chronically anticoagulated with Eliquis. No bleeding or excessive bruising. Aware of emergency precautions with anticoagulation therapy. Follow up with cardiology as scheduled.

## 2022-09-03 NOTE — Telephone Encounter (Signed)
Called and spoke with pt's spouse Lucita Ferrara letting her know that the paperwork for pt assistance was sent to [facility] for pt and she verbalized understanding. Nothing further needed.

## 2022-09-03 NOTE — Telephone Encounter (Signed)
Lois states pharmacy paperwork needs to be sent to [facility].

## 2022-09-04 ENCOUNTER — Encounter: Payer: Self-pay | Admitting: Student

## 2022-09-04 ENCOUNTER — Ambulatory Visit (INDEPENDENT_AMBULATORY_CARE_PROVIDER_SITE_OTHER): Payer: Medicare HMO | Admitting: Student

## 2022-09-04 VITALS — BP 191/105 | HR 88 | Ht 71.0 in | Wt 164.0 lb

## 2022-09-04 DIAGNOSIS — I10 Essential (primary) hypertension: Secondary | ICD-10-CM

## 2022-09-04 DIAGNOSIS — E1122 Type 2 diabetes mellitus with diabetic chronic kidney disease: Secondary | ICD-10-CM

## 2022-09-04 DIAGNOSIS — Z794 Long term (current) use of insulin: Secondary | ICD-10-CM | POA: Diagnosis not present

## 2022-09-04 DIAGNOSIS — Z79899 Other long term (current) drug therapy: Secondary | ICD-10-CM

## 2022-09-04 DIAGNOSIS — N183 Chronic kidney disease, stage 3 unspecified: Secondary | ICD-10-CM

## 2022-09-04 DIAGNOSIS — Z23 Encounter for immunization: Secondary | ICD-10-CM

## 2022-09-04 DIAGNOSIS — E119 Type 2 diabetes mellitus without complications: Secondary | ICD-10-CM

## 2022-09-04 LAB — POCT GLYCOSYLATED HEMOGLOBIN (HGB A1C): HbA1c, POC (controlled diabetic range): 6 % (ref 0.0–7.0)

## 2022-09-04 MED ORDER — LOSARTAN POTASSIUM 25 MG PO TABS: 25.0000 mg | ORAL_TABLET | Freq: Every day | ORAL | 3 refills | Status: DC

## 2022-09-04 NOTE — Progress Notes (Unsigned)
    SUBJECTIVE:   CHIEF COMPLAINT / HPI:   Diabetic Follow Up: Patient is a 71 y.o. male who present today for diabetic follow up.   Patient endorses {rwdmsmartlistproblems:24882}  Home medications include: 5 units 3 times daily with meals NovoLog, insulin detemir 28 units in morning ACEi/ARB: {yes/no/default A/C:16606::"TKZ applicable"} was previously on lisinopril but this was discontinued by cardiology in 2020 due to AKI. Statin: yes rosuvastatin 10 Patient endorses taking these medications as prescribed.***  Most recent A1Cs:  Lab Results  Component Value Date   HGBA1C 6.0 09/04/2022   HGBA1C 6.2 (H) 02/12/2022   HGBA1C 7.0 (H) 04/07/2021   Last Microalbumin, LDL, Creatinine: Lab Results  Component Value Date   LDLCALC 76 02/12/2022   CREATININE 1.65 (H) 02/12/2022   Patient {rwdoesdoesnot:24881} check blood glucose on a regular basis.  Patient {rwisisnot:24883} up to date on diabetic eye. Patient {rwisisnot:24883} up to date on diabetic foot exam.  Medication Patient was referred to neurology in 2022 as he says he was started on Keppra in the hospital however has not had a history of seizures. Referrals been closed as patient was unable to be reached at that time currently still taking Keppra 500 mg daily.  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     Gerrit Heck, MD [facility]

## 2022-09-04 NOTE — Patient Instructions (Addendum)
It was great to see you! Thank you for allowing me to participate in your care!   Our plans for today:  - Your A1c is 6.0, I want to decrease your detemir to 14 units daily instead of 28 units. Please keep a sugar log after this change -I am going to add on losartan 25 mg daily for blood pressure and kidney protection -I am going to refer you to neurology to figure out why you are on Keppra - 2 week follow up with me as we need to get your blood pressure under control  Take care and seek immediate care sooner if you develop any concerns.  Gerrit Heck, MD

## 2022-09-05 NOTE — Assessment & Plan Note (Signed)
May be over controlled currently-A1c of 6.0 today. No overt lows that patient has experienced but risk is high with insulin regimen currently. -Decrease detemir to 14 units daily (from 28 units) -novolog 5 units TID -f/u in 2 weeks to see how he is doing with glucose logs

## 2022-09-05 NOTE — Assessment & Plan Note (Signed)
Significantly elevated at 191/105 today. No end organ red flags today. Last seen by cardiology in April and currently on hydralazine and coreg. Has not been started on ACE/ARB d/t renal function in past. -hydralazine TID -coreg BID -add losartan 25 mg daily -BMP in 2 weeks

## 2022-09-05 NOTE — Assessment & Plan Note (Addendum)
Has not been started on ACE/ARB due to CKD in past. Would like to trail losartan 25 mg daily for patient. -Monitor BMP at next visit in 2 weeks -Nephrology referral

## 2022-09-05 NOTE — Assessment & Plan Note (Signed)
Has been on Keppra from hospitalization a while back. Denies any seizures. Was referred to neurology in 2022 but that referral has been closed. -neurology referral

## 2022-09-15 ENCOUNTER — Telehealth: Payer: Self-pay | Admitting: Cardiology

## 2022-09-15 NOTE — Telephone Encounter (Signed)
Pt c/o medication issue:  1. Name of Medication: apixaban (ELIQUIS) 5 MG TABS tablet   2. How are you currently taking this medication (dosage and times per day)?  Take 1 tablet (5 mg total) by mouth 2 (two) times daily. - Oral   3. Are you having a reaction (difficulty breathing--STAT)?    4. What is your medication issue? Pt wife calling about [employer] application status. Form was faxed 08/20/22.    Patient calling the office for samples of medication:   1.  What medication and dosage are you requesting samples for? apixaban (ELIQUIS) 5 MG TABS tablet   2.  Are you currently out of this medication? Pt has one pill left

## 2022-09-15 NOTE — Telephone Encounter (Signed)
Medication samples have been provided to the patient.  Drug name: Eliquis 5mg   Qty: 3 boxes LOT: [id number] Exp.Date: 02/2024  Samples left at front desk for patient pick-up. Patient notified.   Spoke with pt's wife. She wanted to check on the status of their pt assistance application. Will forward to Dr. Doug Sou nurse to follow up. Wife verbalizes understanding.

## 2022-09-16 ENCOUNTER — Telehealth: Payer: Self-pay | Admitting: Pulmonary Disease

## 2022-09-16 MED ORDER — TRELEGY ELLIPTA 100-62.5-25 MCG/ACT IN AEPB: 1.0000 | INHALATION_SPRAY | Freq: Every day | RESPIRATORY_TRACT | 11 refills | Status: DC

## 2022-09-16 NOTE — Telephone Encounter (Signed)
Printed off Trelegy rx for Kyle [name] to sign. Katie to sign since Dr Elsworth Soho is out and Kyle [name] is app on the day. Will fax to [facility] and noted from patients wife when I called just to make sure he did not need to fill out a new packet. Nothing further needed

## 2022-09-21 NOTE — Patient Instructions (Incomplete)
It was nice seeing you today!  Blood work today.  See me in 3 months or whenever is a good for you.  Stay well, Mavrick Mcquigg, MD [facility] [phone]  --  Make sure to check out at the front desk before you leave today.  Please arrive at least 15 minutes prior to your scheduled appointments.  If you had blood work today, I will send you a MyChart message or a letter if results are normal. Otherwise, I will give you a call.  If you had a referral placed, they will call you to set up an appointment. Please give us a call if you don't hear back in the next 2 weeks.  If you need additional refills before your next appointment, please call your pharmacy first.  

## 2022-09-21 NOTE — Progress Notes (Deleted)
    SUBJECTIVE:   CHIEF COMPLAINT / HPI:  No chief complaint on file.   Patient was seen by PCP about 2 to 3 weeks ago for diabetes follow-up, A1c 6.0.  Insulin detemir was decreased from 28 units to 14 units daily and patient was continued on NovoLog 5 units 3 times daily.    He was also started on losartan 25 mg due to significantly elevated BP.  He was continued on hydralazine 3 times daily and carvedilol twice daily.  PERTINENT  PMH / PSH: T2DM, HTN, A-fib, CKD 3, COPD on 4 L oxygen, PAD  Patient Care Team: Gerrit Heck, MD as PCP - General (Family Medicine) Martinique, Peter M, MD as PCP - Cardiology (Cardiology)   OBJECTIVE:   There were no vitals taken for this visit.  Physical Exam      09/04/2022   10:53 AM  Depression screen PHQ 2/9  Decreased Interest 0  Down, Depressed, Hopeless 0  PHQ - 2 Score 0  Altered sleeping 0  Tired, decreased energy 1  Change in appetite 0  Feeling bad or failure about yourself  0  Trouble concentrating 0  Moving slowly or fidgety/restless 0  Suicidal thoughts 0  PHQ-9 Score 1     {Show previous vital signs (optional):23777}  {Labs  Heme  Chem  Endocrine  Serology  Results Review (optional):23779}  ASSESSMENT/PLAN:   No problem-specific Assessment & Plan notes found for this encounter.    No follow-ups on file.   Zola Button, MD [place]

## 2022-09-22 ENCOUNTER — Ambulatory Visit: Payer: Medicare HMO | Admitting: Family Medicine

## 2022-09-23 ENCOUNTER — Encounter: Payer: Self-pay | Admitting: Neurology

## 2022-09-23 ENCOUNTER — Telehealth: Payer: Self-pay

## 2022-09-23 NOTE — Telephone Encounter (Signed)
Submitted application for [place] (LEVEMIR NO LONGER ON PROGRAM) to [place] for patient assistance.   Phone: [phone]

## 2022-09-28 ENCOUNTER — Other Ambulatory Visit: Payer: Self-pay | Admitting: Student

## 2022-09-29 ENCOUNTER — Other Ambulatory Visit (HOSPITAL_COMMUNITY): Payer: Self-pay

## 2022-09-29 ENCOUNTER — Telehealth: Payer: Self-pay

## 2022-09-29 NOTE — Telephone Encounter (Signed)
Patients wife called to request samples of novolog flexpen while novo nordisk shipment is pending.   We currently dont have samples for this medication. Encouraged patient to fill at the pharmacy if possible. Informed the wife that one pen (37ml) at the pharmacy should have a copay of $29 (20 day supply). Told pt's wife I would route this to his pcp to get a refill sent in.    Dr Jinny Sanders, can a 3ML pen of novolog be sent to [facility]? Let me know if so, I will give them a call back.

## 2022-09-30 ENCOUNTER — Other Ambulatory Visit: Payer: Self-pay | Admitting: Student

## 2022-09-30 DIAGNOSIS — E119 Type 2 diabetes mellitus without complications: Secondary | ICD-10-CM

## 2022-09-30 MED ORDER — INSULIN ASPART 100 UNIT/ML FLEXPEN: 5.0000 [IU] | PEN_INJECTOR | Freq: Three times a day (TID) | SUBCUTANEOUS | 0 refills | Status: DC

## 2022-09-30 NOTE — Progress Notes (Deleted)
    SUBJECTIVE:   CHIEF COMPLAINT / HPI:  No chief complaint on file.   Patient was seen by PCP about 1 month ago for diabetes follow-up, A1c 6.0.  Insulin detemir was decreased from 28 units to 14 units daily and patient was continued on NovoLog 5 units 3 times daily.     He was also started on losartan 25 mg due to significantly elevated BP.  He was continued on hydralazine 3 times daily and carvedilol twice daily.  PERTINENT  PMH / PSH: T2DM, HTN, A-fib, CKD 3, COPD on 4 L oxygen, PAD   Patient Care Team: Gerrit Heck, MD as PCP - General (Family Medicine) Martinique, Peter M, MD as PCP - Cardiology (Cardiology)   OBJECTIVE:   There were no vitals taken for this visit.  Physical Exam      09/04/2022   10:53 AM  Depression screen PHQ 2/9  Decreased Interest 0  Down, Depressed, Hopeless 0  PHQ - 2 Score 0  Altered sleeping 0  Tired, decreased energy 1  Change in appetite 0  Feeling bad or failure about yourself  0  Trouble concentrating 0  Moving slowly or fidgety/restless 0  Suicidal thoughts 0  PHQ-9 Score 1     {Show previous vital signs (optional):23777}  {Labs  Heme  Chem  Endocrine  Serology  Results Review (optional):23779}  ASSESSMENT/PLAN:   No problem-specific Assessment & Plan notes found for this encounter.    No follow-ups on file.   Zola Button, MD [place]

## 2022-09-30 NOTE — Patient Instructions (Incomplete)
It was nice seeing you today!  Blood work today.  See me in 3 months or whenever is a good for you.  Stay well, Latricia Cerrito, MD [facility] [phone]  --  Make sure to check out at the front desk before you leave today.  Please arrive at least 15 minutes prior to your scheduled appointments.  If you had blood work today, I will send you a MyChart message or a letter if results are normal. Otherwise, I will give you a call.  If you had a referral placed, they will call you to set up an appointment. Please give us a call if you don't hear back in the next 2 weeks.  If you need additional refills before your next appointment, please call your pharmacy first.  

## 2022-10-01 NOTE — Telephone Encounter (Signed)
Followed up with pharmacy. Filled RX for one pen for $16.78.   Reached out to pt's wife Lucita Ferrara) and informed her Mr [facility] insulin is ready for pickup. Let her know I will reach out once I hear from novo nordisk or receive a shipment

## 2022-10-02 ENCOUNTER — Ambulatory Visit: Payer: Medicare HMO | Admitting: Neurology

## 2022-10-02 ENCOUNTER — Ambulatory Visit: Payer: Medicare HMO | Admitting: Podiatry

## 2022-10-02 ENCOUNTER — Ambulatory Visit: Payer: Medicare HMO | Admitting: Family Medicine

## 2022-10-06 ENCOUNTER — Telehealth: Payer: Self-pay | Admitting: Cardiology

## 2022-10-06 NOTE — Telephone Encounter (Signed)
Samples placed at front desk.  Wife aware (ok per DPR)

## 2022-10-06 NOTE — Telephone Encounter (Signed)
Patient calling the office for samples of medication:   1.  What medication and dosage are you requesting samples for? apixaban (ELIQUIS) 5 MG TABS tablet  2.  Are you currently out of this medication? Yes    

## 2022-10-06 NOTE — Telephone Encounter (Signed)
He is fine to have 5 mg samples of Eliquis

## 2022-10-09 ENCOUNTER — Ambulatory Visit (INDEPENDENT_AMBULATORY_CARE_PROVIDER_SITE_OTHER): Payer: Medicare HMO | Admitting: Student

## 2022-10-09 ENCOUNTER — Encounter: Payer: Self-pay | Admitting: Student

## 2022-10-09 ENCOUNTER — Other Ambulatory Visit: Payer: Self-pay

## 2022-10-09 VITALS — BP 168/77 | HR 89 | Wt 164.8 lb

## 2022-10-09 DIAGNOSIS — I10 Essential (primary) hypertension: Secondary | ICD-10-CM | POA: Diagnosis not present

## 2022-10-09 DIAGNOSIS — M79641 Pain in right hand: Secondary | ICD-10-CM

## 2022-10-09 NOTE — Patient Instructions (Signed)
It was great to see you! Thank you for allowing me to participate in your care!  It looks like your hand pain/swelling has resolved. Continue to keep an eye on it and use tylenol as needed for pain.   Our plans for today:  - Continue to monitor - Follow up if:  Pain unbearable, not responsive to pain med  Swelling returns  Issues with range of motion/weakness moving hand  Take care and seek immediate care sooner if you develop any concerns.   Dr. Holley Bouche, MD [place]

## 2022-10-09 NOTE — Progress Notes (Signed)
  SUBJECTIVE:   CHIEF COMPLAINT / HPI:   Rt. hand swelling Woke up this morning with hand swollen and hurting, after a night of it hurting. Denies any hx of trauma but notes he was sleeping on hand for long time. Took tylenol for pain and that helped. Overnight the pain was pulsating feeling, but it got better this morning. Patient notes he slept on his hand and that caused it to hurt/why he woke up.     HTN: BP elevated today. Patient denies any CP or SOB  PERTINENT  PMH / PSH: COPD, CHF    OBJECTIVE:  BP (!) 168/77   Pulse 89   Wt 164 lb 12.8 oz (74.8 kg)   SpO2 (!) 85%   BMI 22.98 kg/m  Physical Exam Cardiovascular:     Rate and Rhythm: Normal rate and regular rhythm.     Pulses: Normal pulses.     Heart sounds: Normal heart sounds. No murmur heard.    No friction rub. No gallop.  Pulmonary:     Effort: Pulmonary effort is normal. No respiratory distress.     Breath sounds: Normal breath sounds. No stridor. No wheezing, rhonchi or rales.  Musculoskeletal:     Right wrist: Normal. No swelling, deformity, effusion, lacerations, tenderness or bony tenderness. Normal range of motion.     Right hand: Bony tenderness present. No swelling, deformity or lacerations. Decreased range of motion.     Comments: Darkened calluses on finger tips  Skin:    Capillary Refill: Capillary refill takes less than 2 seconds.  Neurological:     Mental Status: Mental status is at baseline.  Psychiatric:        Mood and Affect: Mood normal.        Behavior: Behavior normal.        Thought Content: Thought content normal.        Judgment: Judgment normal.      ASSESSMENT/PLAN:  Hand pain, right Assessment & Plan: Patient notes hand pain, after sleeping on hand for most of night, waking up with hand pulsating and painful. Patient also had bony tenderness in index finger. Patient likely had slept on hand causing occlusion of blood flow and pain/tenderness. Hand exam normal w/o swelling,  erythema, or change in ROM, but did have some tenderness at DIP on index finger most concerning for arthritis. Index finger and hand otherwise normal making gout, or septic arthritis less likely. Patient reassured by improvement in hand. -Continue to monitor -OTC pain meds   Primary hypertension Assessment & Plan: BP elevated today to 168/77, patient denies any CP or SOB. Notes he has taken his meds. Will have pt f/u for bp reading and consider ambulatory monitoring -F/u 1 wk for BP check -Consider Ambulatory monitoring.       Hypoxia:  Patient noted to have hypoxia on vitals, w/ SPO2 of 85%, but was not dyspneic and had normal cap refill. Patient notes hx of vascular disease and that he get's hypoxic reading on SPO2 monitors that are not true. Patient was normal on exam, conversational, no signs of distress, with normal lung exam, and darkened calluses on finger tips. I believe this patients finger tips/vascular hx are why the SPO2 sensor was reading so low, we could not place the sensor anywhere else.  No follow-ups on file. Holley Bouche, MD 10/12/2022, 5:31 AM   PGY-2, [place]

## 2022-10-10 ENCOUNTER — Other Ambulatory Visit: Payer: Self-pay

## 2022-10-10 ENCOUNTER — Emergency Department (HOSPITAL_BASED_OUTPATIENT_CLINIC_OR_DEPARTMENT_OTHER): Payer: Medicare HMO | Admitting: Radiology

## 2022-10-10 ENCOUNTER — Emergency Department (HOSPITAL_BASED_OUTPATIENT_CLINIC_OR_DEPARTMENT_OTHER)
Admission: EM | Admit: 2022-10-10 | Discharge: 2022-10-10 | Disposition: A | Discharge status: Home / Self Care | Payer: Medicare HMO | Attending: Emergency Medicine | Admitting: Emergency Medicine

## 2022-10-10 ENCOUNTER — Encounter: Payer: Self-pay | Admitting: Orthopedic Surgery

## 2022-10-10 ENCOUNTER — Encounter (HOSPITAL_BASED_OUTPATIENT_CLINIC_OR_DEPARTMENT_OTHER): Payer: Self-pay

## 2022-10-10 DIAGNOSIS — R2231 Localized swelling, mass and lump, right upper limb: Secondary | ICD-10-CM | POA: Diagnosis present

## 2022-10-10 DIAGNOSIS — Z79899 Other long term (current) drug therapy: Secondary | ICD-10-CM | POA: Diagnosis not present

## 2022-10-10 DIAGNOSIS — Z794 Long term (current) use of insulin: Secondary | ICD-10-CM | POA: Insufficient documentation

## 2022-10-10 DIAGNOSIS — Z7901 Long term (current) use of anticoagulants: Secondary | ICD-10-CM | POA: Insufficient documentation

## 2022-10-10 DIAGNOSIS — N189 Chronic kidney disease, unspecified: Secondary | ICD-10-CM | POA: Diagnosis not present

## 2022-10-10 DIAGNOSIS — E1122 Type 2 diabetes mellitus with diabetic chronic kidney disease: Secondary | ICD-10-CM | POA: Diagnosis not present

## 2022-10-10 DIAGNOSIS — I509 Heart failure, unspecified: Secondary | ICD-10-CM | POA: Insufficient documentation

## 2022-10-10 DIAGNOSIS — I130 Hypertensive heart and chronic kidney disease with heart failure and stage 1 through stage 4 chronic kidney disease, or unspecified chronic kidney disease: Secondary | ICD-10-CM | POA: Insufficient documentation

## 2022-10-10 DIAGNOSIS — J449 Chronic obstructive pulmonary disease, unspecified: Secondary | ICD-10-CM | POA: Diagnosis not present

## 2022-10-10 DIAGNOSIS — I10 Essential (primary) hypertension: Secondary | ICD-10-CM

## 2022-10-10 DIAGNOSIS — M7989 Other specified soft tissue disorders: Secondary | ICD-10-CM

## 2022-10-10 LAB — SEDIMENTATION RATE: Sed Rate: 7 mm/hr (ref 0–16)

## 2022-10-10 LAB — CBC WITH DIFFERENTIAL/PLATELET
Abs Immature Granulocytes: 0.03 10*3/uL (ref 0.00–0.07)
Basophils Absolute: 0 10*3/uL (ref 0.0–0.1)
Basophils Relative: 0 %
Eosinophils Absolute: 0.1 10*3/uL (ref 0.0–0.5)
Eosinophils Relative: 1 %
HCT: 47.3 % (ref 39.0–52.0)
Hemoglobin: 15.3 g/dL (ref 13.0–17.0)
Immature Granulocytes: 0 %
Lymphocytes Relative: 17 %
Lymphs Abs: 1.6 10*3/uL (ref 0.7–4.0)
MCH: 30.6 pg (ref 26.0–34.0)
MCHC: 32.3 g/dL (ref 30.0–36.0)
MCV: 94.6 fL (ref 80.0–100.0)
Monocytes Absolute: 1.1 10*3/uL — ABNORMAL HIGH (ref 0.1–1.0)
Monocytes Relative: 11 %
Neutro Abs: 6.6 10*3/uL (ref 1.7–7.7)
Neutrophils Relative %: 71 %
Platelets: 128 10*3/uL — ABNORMAL LOW (ref 150–400)
RBC: 5 MIL/uL (ref 4.22–5.81)
RDW: 15.2 % (ref 11.5–15.5)
WBC: 9.5 10*3/uL (ref 4.0–10.5)
nRBC: 0 % (ref 0.0–0.2)

## 2022-10-10 LAB — BASIC METABOLIC PANEL
Anion gap: 8 (ref 5–15)
BUN: 22 mg/dL (ref 8–23)
CO2: 27 mmol/L (ref 22–32)
Calcium: 9.6 mg/dL (ref 8.9–10.3)
Chloride: 106 mmol/L (ref 98–111)
Creatinine, Ser: 1.62 mg/dL — ABNORMAL HIGH (ref 0.61–1.24)
GFR, Estimated: 45 mL/min — ABNORMAL LOW (ref >60)
Glucose, Bld: 127 mg/dL — ABNORMAL HIGH (ref 70–99)
Potassium: 5.6 mmol/L — ABNORMAL HIGH (ref 3.5–5.1)
Sodium: 141 mmol/L (ref 135–145)

## 2022-10-10 LAB — C-REACTIVE PROTEIN: CRP: 4.2 mg/dL — ABNORMAL HIGH (ref <1.0)

## 2022-10-10 LAB — CBG MONITORING, ED: Glucose-Capillary: 92 mg/dL (ref 70–99)

## 2022-10-10 MED ORDER — OXYCODONE HCL 5 MG PO TABS: 5.0000 mg | ORAL_TABLET | Freq: Four times a day (QID) | ORAL | 0 refills | Status: DC | PRN

## 2022-10-10 MED ORDER — OXYCODONE HCL 5 MG PO TABS
5.0000 mg | ORAL_TABLET | Freq: Once | ORAL | Status: AC
  Administered 2022-10-10: 5 mg via ORAL
  Filled 2022-10-10: qty 1

## 2022-10-10 MED ORDER — DOXYCYCLINE HYCLATE 100 MG PO CAPS: 100.0000 mg | ORAL_CAPSULE | Freq: Two times a day (BID) | ORAL | 0 refills | Status: DC

## 2022-10-10 MED ORDER — DOXYCYCLINE HYCLATE 100 MG PO TABS
100.0000 mg | ORAL_TABLET | Freq: Once | ORAL | Status: AC
  Administered 2022-10-10: 100 mg via ORAL
  Filled 2022-10-10: qty 1

## 2022-10-10 MED ORDER — HYDRALAZINE HCL 25 MG PO TABS
25.0000 mg | ORAL_TABLET | Freq: Once | ORAL | Status: AC
  Administered 2022-10-10: 25 mg via ORAL
  Filled 2022-10-10: qty 1

## 2022-10-10 NOTE — Discharge Instructions (Signed)
I have given you a prescription for an antibiotic doxycycline for you to take as prescribed in its entirety for management of your symptoms. I have also given you a prescription for oxycodone which is a narcotic pain medication for you to take as prescribed as needed for severe pain only.  Do not drive or operate heavy machinery while taking this medication.  You may take Tylenol as well.  I have also given you a referral to a hand specialist with a number to call to schedule an appointment for continued evaluation and management of your symptoms.  Follow-up with your primary care doctor as well.   Return if development of any new or worsening symptoms.

## 2022-10-10 NOTE — ED Provider Notes (Signed)
[facility] EMERGENCY DEPT Provider Note   CSN: [id number] Arrival date & time: 10/10/22  5625     History  Chief Complaint  Patient presents with   Hand Pain    Kyle [name] is a 71 y.o. male.  Patient with history of COPD on oxygen, congestive heart failure, chronic kidney disease, chronic anticoagulation, diabetes -- presents to the emergency department today for evaluation of right index finger pain and swelling.  He also has some milder pain in the middle finger and wrist area.  Symptoms started about 2 days ago.  He denies inciting injury or incident.  No wounds or bites.  Pain became severe last night.  He states that he had difficulty sleeping because of the pain.  Today he was having difficulty with gripping objects.  Denies history of inflammatory arthritis such as gout.  He has a history of hypertension, states that he took his medications today.       Home Medications Prior to Admission medications   Medication Sig Start Date End Date Taking? Authorizing Provider  Accu-Chek FastClix Lancets MISC USE TO CHECK GLUCOSE AS INDICATED Patient not taking: Reported on 09/04/2022 06/11/22   Gerrit Heck, MD  albuterol (VENTOLIN HFA) 108 (90 Base) MCG/ACT inhaler Inhale 2 puffs into the lungs every 6 (six) hours as needed for wheezing or shortness of breath. 04/11/21   Brimage, Ronnette Juniper, DO  apixaban (ELIQUIS) 5 MG TABS tablet Take 1 tablet (5 mg total) by mouth 2 (two) times daily. 08/20/22   Martinique, Peter M, MD  carvedilol (COREG) 6.25 MG tablet TAKE 1 TABLET TWICE DAILY WITH MEALS (NEED MD APPOINTMENT) 03/02/22   Martinique, Peter M, MD  Fluticasone-Umeclidin-Vilant (TRELEGY ELLIPTA) 100-62.5-25 MCG/ACT AEPB Inhale 1 puff into the lungs daily. 09/16/22   Cobb, Karie Schwalbe, NP  furosemide (LASIX) 40 MG tablet Take 0.5 tablets (20 mg total) by mouth daily. 03/24/22   Martinique, Peter M, MD  glucose blood (ACCU-CHEK GUIDE) test strip TEST BLOOD SUGAR TWICE DAILY Patient not  taking: Reported on 09/04/2022 03/25/22   Lurline Del, DO  guaiFENesin (MUCINEX) 600 MG 12 hr tablet Take 2 tablets (1,200 mg total) by mouth 2 (two) times daily. 11/30/18   Lavina Hamman, MD  hydrALAZINE (APRESOLINE) 25 MG tablet TAKE 1 TABLET THREE TIMES DAILY (NEED APPOINTMENT WITH PCP FOR ANOTHER REFILL) 08/13/22   Gerrit Heck, MD  insulin aspart (NOVOLOG) 100 UNIT/ML FlexPen Inject 5 Units into the skin 3 (three) times daily with meals. 09/30/22   Gerrit Heck, MD  insulin detemir (LEVEMIR) 100 unit/ml SOLN Inject 28 Units into the skin in the morning. 04/12/21   Autry-Lott, Naaman Plummer, DO  Insulin Pen Needle 32G X 4 MM MISC Use as directed with insulin pen Strength: 32G X 4 MM 08/13/22   Gerrit Heck, MD  isosorbide mononitrate (IMDUR) 30 MG 24 hr tablet TAKE 1 TABLET BY MOUTH ONCE DAILY. 03/02/22   Martinique, Peter M, MD  levETIRAcetam (KEPPRA) 500 MG tablet TAKE 1 TABLET DAILY 09/29/22   Gerrit Heck, MD  losartan (COZAAR) 25 MG tablet Take 1 tablet (25 mg total) by mouth at bedtime. 09/04/22   Gerrit Heck, MD  Multiple Vitamins-Minerals (CENTRUM SILVER 50+MEN) TABS Take 1 tablet by mouth daily with breakfast.    [provider]  OXYGEN Inhale 4.5 L/min into the lungs continuous.    [provider]  rosuvastatin (CRESTOR) 10 MG tablet TAKE 1 TABLET AT BEDTIME 06/08/22   Waynetta Sandy, MD  Allergies    Patient has no known allergies.    Review of Systems   Review of Systems  Physical Exam Updated Vital Signs BP (!) 211/83   Pulse 87   Temp 98.3 F (36.8 C) (Oral)   Resp 16   Ht _0  (1.803 m)   Wt 74.8 kg   SpO2 95%   BMI 23.01 kg/m  Physical Exam Vitals and nursing note reviewed.  Constitutional:      Appearance: He is well-developed.  HENT:     Head: Normocephalic and atraumatic.  Eyes:     Conjunctiva/sclera: Conjunctivae normal.  Cardiovascular:     Pulses: Normal pulses. No decreased pulses.  Musculoskeletal:         General: Tenderness present.     Cervical back: Normal range of motion and neck supple.     Right lower leg: No edema.     Left lower leg: No edema.     Comments: Right hand: Index finger has generalized swelling and mild erythema and warmth.  This extends the length of the finger.  No significant swelling and only mild tenderness of the middle finger.  Also some mild wrist tenderness without swelling or redness.  No visible lymphangitis.  Patient has significant pain with passive extension of the index finger and is able to flex somewhat, however is decreased due to pain.  Capillary refill less than 2 seconds.  Distal sensation is intact.  Skin:    General: Skin is warm and dry.  Neurological:     Mental Status: He is alert.     Sensory: No sensory deficit.     Comments: Motor, sensation, and vascular distal to the injury is fully intact.   Psychiatric:        Mood and Affect: Mood normal.             ED Results / Procedures / Treatments   Labs (all labs ordered are listed, but only abnormal results are displayed) Labs Reviewed  CBC WITH DIFFERENTIAL/PLATELET - Abnormal; Notable for the following components:      Result Value   Platelets 128 (*)    Monocytes Absolute 1.1 (*)    All other components within normal limits  BASIC METABOLIC PANEL - Abnormal; Notable for the following components:   Potassium 5.6 (*)    Glucose, Bld 127 (*)    Creatinine, Ser 1.62 (*)    GFR, Estimated 45 (*)    All other components within normal limits  SEDIMENTATION RATE  C-REACTIVE PROTEIN  CBG MONITORING, ED    EKG None  Radiology DG Hand Complete Right  Result Date: 10/10/2022 CLINICAL DATA:  Right hand swelling.  No known injury EXAM: RIGHT HAND - COMPLETE 3+ VIEW COMPARISON:  None Available. FINDINGS: There is no evidence of fracture or dislocation. Mild osteoarthritic changes throughout the hand. No discrete erosion. Soft tissues are unremarkable. IMPRESSION: Mild osteoarthritic  changes throughout the hand. No acute findings. Electronically Signed   By: Davina Poke D.O.   On: 10/10/2022 09:47    Procedures Procedures    Medications Ordered in ED Medications  doxycycline (VIBRA-TABS) tablet 100 mg (100 mg Oral Given 10/10/22 1223)  oxyCODONE (Oxy IR/ROXICODONE) immediate release tablet 5 mg (5 mg Oral Given 10/10/22 1223)    ED Course/ Medical Decision Making/ A&P    Patient seen and examined. History obtained directly from patient.   Labs/EKG: Ordered CBC, CBC, BMP.  Imaging: X-ray ordered in triage, personally reviewed and interpreted, agree negative.  Medications/Fluids: Ordered: P.o oxycodone, p.o doxycycline.   Most recent vital signs reviewed and are as follows: BP (!) 211/83   Pulse 87   Temp 98.3 F (36.8 C) (Oral)   Resp 16   Ht _0  (1.803 m)   Wt 74.8 kg   SpO2 95%   BMI 23.01 kg/m   Initial impression: Finger pain and swelling, concern for tenosynovitis.  Patient discussed with and seen by Dr. Johnney Killian.  Plan is to touch base with orthopedic hand surgery for further recommendations.  1:46 PM consulted by telephone with Dr. Greta Doom orthopedic hand surgery.  Discussed case and lab findings to this point.  He request that we obtain ESR and CRP to help differentiate between infection and inflammatory process.  These have been ordered.  Will contact him again once these return. Pt updated.   2:44 PM Signout to [place] PA-C at shift change. Dr. Philip Aspen aware as well.                             Medical Decision Making Amount and/or Complexity of Data Reviewed Labs: ordered. Radiology: ordered.  Risk Prescription drug management.   Patient presents with index finger swelling.  Currently awaiting inflammatory markers.  No history of gout but inflammatory arthritis is possible.  Also concern for infectious etiology such as flexor tenosynovitis, however patient without any wounds or obvious injuries.  He appears well, nontoxic.   He has been hypertensive.         Final Clinical Impression(s) / ED Diagnoses Final diagnoses:  Finger swelling  Hypertension, unspecified type    Rx / DC Orders ED Discharge Orders     None         Carlisle Cater, PA-C 10/10/22 1447    Charlesetta Shanks, MD 10/12/22 1409

## 2022-10-10 NOTE — ED Provider Notes (Signed)
Care handoff from [facility], PA-C at shift change. Please see their note for further information.  Briefly: Patient presents today with complaints of right hand swelling and pain x 2 days. No injury or wound. Hx COPD on oxygen at baseline.  Ddx: Tenosynovitis versus inflammatory arthritis versus cellulitis.  Is not an exhaustive differential.  Plan: Previous provider discussed case with hand surgery on-call Dr. Greta Doom who requests inflammatory markers ESR/CRP to help differentiate between infection and inflammatory process. These are pending at signout and will determine dispo.  Additionally, patient was noted to be hypertensive but has not had his afternoon meds.  Will will need reevaluation to ensure that blood pressure is normalized prior to discharge.  Patient's ESR has resulted and is 7.  CRP is still in process as it is unfortunately a send out lab and takes several hours to result.  Patient states that he unfortunately does not drive after dark and was hoping to be discharged before the sun goes down.  Reach back out to Dr. Greta Doom who states that a normal ESR is sufficient and is therefore likely not tenosynovitis.  Plan to discharge with doxycycline and pain medication and have close hand follow-up.  Upon reevaluation, patient's blood pressure has normalized after hydralazine.  Physical Exam Vitals and nursing note reviewed.  Constitutional:      Appearance: He is well-developed.  HENT:     Head: Normocephalic and atraumatic.  Eyes:     Conjunctiva/sclera: Conjunctivae normal.  Cardiovascular:     Pulses: Normal pulses. No decreased pulses.  Musculoskeletal:        General: Tenderness present.     Cervical back: Normal range of motion and neck supple.     Right lower leg: No edema.     Left lower leg: No edema.     Comments: Right hand: Index finger has generalized swelling and mild erythema and warmth.  This extends the length of the finger and into the hand.  No significant  swelling and only mild tenderness of the middle finger.  Also some mild wrist tenderness without swelling or redness.  No visible lymphangitis.  Patient has significant pain with passive extension of the index finger and is able to flex somewhat, however is decreased due to pain.  Capillary refill less than 2 seconds.  Distal sensation is intact. Radial and ulnar pulses 2+.  Skin:    General: Skin is warm and dry.  Neurological:     Mental Status: He is alert.     Sensory: No sensory deficit.     Comments: Motor, sensation, and vascular distal to the injury is fully intact.   Psychiatric:        Mood and Affect: Mood normal.    Given hand surgery recommendations, will send with doxycycline and oxycodone for pain medication with suspicion for cellulitis.  I have also given referral to hand surgery for continued evaluation and management of his symptoms.  I have discussed with the patient close return precautions and red flag symptoms that would prompt immediate return.  Patient is understanding and amenable with plan, discharged in stable condition.  Findings and plan of care discussed with supervising physician Dr. Philip Aspen who is in agreement.     Nestor Lewandowsky 10/10/22 1912    Fransico Meadow, MD 10/12/22 [phone]

## 2022-10-10 NOTE — ED Triage Notes (Signed)
Pt states he noticed swelling in his right hand last night. Pt denies injury or trauma. No hx of gout

## 2022-10-10 NOTE — Progress Notes (Signed)
Phone calls to me regarding patients condition. My recommendation was for inflammatory lab values to begin the workup. I will defer to the ED team regarding disposition as I have not evaluated patient personally. Happy to see patient in consultation if needed. I do not feel as though sed rate "rules out" infection as this diagnosis requires more of a workup to differentiate between and infectious process versus inflammatory versus ische mic pain. I personally have not seen the patient nor cleared for discharge- just had a phone conversation. Discharge is at discretion of treating provider.   Matt Holmes, MD

## 2022-10-10 NOTE — ED Provider Notes (Signed)
I provided a substantive portion of the care of this patient.  I personally performed the entirety of the exam for this encounter.      Is alert.  He is no swelling of the right index finger over the past 2 days.  There is been no trauma injury.  No history of gout.  At this point he has significant swelling redness and pain with movement.  Pain also in the medial wrist with flexion and extension.  Right index finger is erythematous diffusely.  Moderate diffuse swelling of the entire digit.  Pain with flexion.  Unable to spontaneously flex to complete range of motion.  Also pain in the medial lateral radial wrist with extension and flexion.  No obvious erythema or swelling at the wrist.  Concerning for tenosynovitis.  Plan for orthopedic hand consultation.   Charlesetta Shanks, MD 10/10/22 1236

## 2022-10-12 DIAGNOSIS — M79641 Pain in right hand: Secondary | ICD-10-CM | POA: Insufficient documentation

## 2022-10-12 NOTE — Assessment & Plan Note (Signed)
BP elevated today to 168/77, patient denies any CP or SOB. Notes he has taken his meds. Will have pt f/u for bp reading and consider ambulatory monitoring -F/u 1 wk for BP check -Consider Ambulatory monitoring.

## 2022-10-12 NOTE — Assessment & Plan Note (Signed)
Patient notes hand pain, after sleeping on hand for most of night, waking up with hand pulsating and painful. Patient also had bony tenderness in index finger. Patient likely had slept on hand causing occlusion of blood flow and pain/tenderness. Hand exam normal w/o swelling, erythema, or change in ROM, but did have some tenderness at DIP on index finger most concerning for arthritis. Index finger and hand otherwise normal making gout, or septic arthritis less likely. Patient reassured by improvement in hand. -Continue to monitor -OTC pain meds

## 2022-10-14 ENCOUNTER — Ambulatory Visit (INDEPENDENT_AMBULATORY_CARE_PROVIDER_SITE_OTHER): Payer: Medicare HMO | Admitting: Student

## 2022-10-14 ENCOUNTER — Encounter: Payer: Self-pay | Admitting: Student

## 2022-10-14 ENCOUNTER — Other Ambulatory Visit: Payer: Self-pay | Admitting: Student

## 2022-10-14 VITALS — BP 157/78 | HR 77 | Ht 71.0 in | Wt 165.4 lb

## 2022-10-14 DIAGNOSIS — E119 Type 2 diabetes mellitus without complications: Secondary | ICD-10-CM

## 2022-10-14 DIAGNOSIS — I10 Essential (primary) hypertension: Secondary | ICD-10-CM

## 2022-10-14 DIAGNOSIS — M79641 Pain in right hand: Secondary | ICD-10-CM | POA: Diagnosis not present

## 2022-10-14 DIAGNOSIS — Z794 Long term (current) use of insulin: Secondary | ICD-10-CM

## 2022-10-14 DIAGNOSIS — M7989 Other specified soft tissue disorders: Secondary | ICD-10-CM | POA: Diagnosis not present

## 2022-10-14 MED ORDER — AMLODIPINE BESYLATE 5 MG PO TABS: 5.0000 mg | ORAL_TABLET | Freq: Every day | ORAL | 0 refills | Status: DC

## 2022-10-14 NOTE — Patient Instructions (Addendum)
It was great to see you! Thank you for allowing me to participate in your care!   Our plans for today:  - stop losartan and start amlodipine 5mg  daily BP recheck in 1 week for a nurse visit - continue your diabetes medicines - you don't have to take vitamin d medicines  Take care and seek immediate care sooner if you develop any concerns.  Gerrit Heck, MD

## 2022-10-14 NOTE — Progress Notes (Signed)
SUBJECTIVE:   CHIEF COMPLAINT / HPI:   Never had a fracture  Hand Swelling Seen 12/15 in clinic for right hand pain. He says that whole dorsum of his hand was swollen and hurting. No trauma to the area. The worst pain was in his index finger. Has had a history of swelling in his left elbow during hospitalization in 2021 thought to be related to possible gout related to AKI/CKD. He went to [facility] on 12/16 due to continued pain. ESR was elevated at 7. Believed to be tenosynovitis vs inflammatory arthritis vs cellulitis. Was started on doxycycline course and given prn oxycodone. Denies any fevers/systemic symptoms currently. His swelling and pain have decreased significantly.   Hypertension: Patient is a 71 y.o. male who present today for follow up of hypertension.   Patient endorses no problems  Home medications include: Carvedilol 6.25 mg twice daily, hydralazine 25 mg 3 times daily, losartan 25 mg nightly (recently started at last visit) Potassium increased to 5.6 after initiation. Patient endorses taking these medications as prescribed. Denies any headache, vision changes, shortness of breath, lower extremity swelling or chest pain   Most recent creatinine trend:  Lab Results  Component Value Date   CREATININE 1.88 (H) 10/14/2022   CREATININE 1.62 (H) 10/10/2022   CREATININE 1.65 (H) 02/12/2022   Patient does check blood pressure at home.  Patient has had a BMP in the past 1 year.  Diabetic Follow Up: Patient is a 71 y.o. male who present today for diabetic follow up.   Patient endorses no problems  Home medications include: NovoLog 5 units 3 times daily, detemir 14 units daily (recently decreased 11/10 due to A1c of 6) ACEi/ARB: yes Statin: yes Patient endorses taking these medications as prescribed.  Most recent A1Cs:  Lab Results  Component Value Date   HGBA1C 6.0 09/04/2022   HGBA1C 6.2 (H) 02/12/2022   HGBA1C 7.0 (H) 04/07/2021   Last  Microalbumin, LDL, Creatinine: Lab Results  Component Value Date   LDLCALC 76 02/12/2022   CREATININE 1.88 (H) 10/14/2022   Patient does check blood glucose on a regular basis. All less than 200. No lows.   OBJECTIVE:   BP (!) 157/78   Pulse 77   Ht _0  (1.803 m)   Wt 165 lb 6.4 oz (75 kg)   SpO2 100%   BMI 23.07 kg/m   General: Well appearing, NAD, awake, alert, responsive to questions Head: Normocephalic atraumatic CV: Regular rate and rhythm no murmurs rubs or gallops Respiratory: Clear to ausculation bilaterally, chest rises symmetrically,  no increased work of breathing Abdomen: Soft, non-tender,  Extremities: Moves upper and lower extremities freely, no edema in LE, right had with moderate pain to palpation of right index DIP, mild swelling of index finger, mild skin darkening of right index finger, cap refill brisk, 2+ radial pulses, sensation intact Neuro: No focal deficits Skin: No rashes or lesions visualized   ASSESSMENT/PLAN:   Hand pain, right Improving on doxycycline course and has oxycodone prn for pain from UC provider. Tenosynovitis vs possible gout given hx of left elbow swelling. -Uric acid -Continue abx course    Hypertension Not controlled still but adherant to medications. Bartholomew Crews was started at last visit but patient had hyperkalemia on last BMP to 5.6. Will switch to amlodipine and uptitrate. Would like to avoid HCTZ if this hand swelling is possibly gout. -stop losartan -start amlodipine 5 mg -continue coreg 6.25 BID -hydral 25 TID -BMP -BP recheck 1 week  nurse visit  Diabetes mellitus type 2, insulin dependent (Joes) Blood sugars at home reviewed. Still at goal and no lows -Continue novolog 5 units TID -detemir 14 units daily -next A1c in [place], [place]

## 2022-10-15 ENCOUNTER — Other Ambulatory Visit: Payer: Self-pay | Admitting: Student

## 2022-10-15 DIAGNOSIS — E119 Type 2 diabetes mellitus without complications: Secondary | ICD-10-CM

## 2022-10-15 LAB — BASIC METABOLIC PANEL
BUN/Creatinine Ratio: 13 (ref 10–24)
BUN: 24 mg/dL (ref 8–27)
CO2: 24 mmol/L (ref 20–29)
Calcium: 9.4 mg/dL (ref 8.6–10.2)
Chloride: 105 mmol/L (ref 96–106)
Creatinine, Ser: 1.88 mg/dL — ABNORMAL HIGH (ref 0.76–1.27)
Glucose: 185 mg/dL — ABNORMAL HIGH (ref 70–99)
Potassium: 4.4 mmol/L (ref 3.5–5.2)
Sodium: 145 mmol/L — ABNORMAL HIGH (ref 134–144)
eGFR: 38 mL/min/{1.73_m2} — ABNORMAL LOW (ref >59)

## 2022-10-15 LAB — URIC ACID: Uric Acid: 8.2 mg/dL (ref 3.8–8.4)

## 2022-10-15 NOTE — Assessment & Plan Note (Signed)
Improving on doxycycline course and has oxycodone prn for pain from UC provider. Tenosynovitis vs possible gout given hx of left elbow swelling. -Uric acid -Continue abx course

## 2022-10-15 NOTE — Assessment & Plan Note (Addendum)
Not controlled still but adherant to medications. Kyle [name] was started at last visit but patient had hyperkalemia on last BMP to 5.6. Will switch to amlodipine and uptitrate. Would like to avoid HCTZ if this hand swelling is possibly gout. -stop losartan -start amlodipine 5 mg -continue coreg 6.25 BID -hydral 25 TID -BMP -BP recheck 1 week nurse visit

## 2022-10-15 NOTE — Assessment & Plan Note (Addendum)
Blood sugars at home reviewed. Still at goal and no lows -Continue novolog 5 units TID -detemir 14 units daily -next A1c in february

## 2022-10-22 ENCOUNTER — Ambulatory Visit: Payer: Medicare HMO

## 2022-10-23 ENCOUNTER — Telehealth: Payer: Self-pay

## 2022-10-23 ENCOUNTER — Ambulatory Visit: Payer: Medicare HMO

## 2022-10-23 VITALS — BP 136/64

## 2022-10-23 DIAGNOSIS — I10 Essential (primary) hypertension: Secondary | ICD-10-CM

## 2022-10-23 NOTE — Telephone Encounter (Addendum)
Received notification from [employer] regarding RE-ENROLLMENT approval for Kyle [name]. Patient assistance approved from 10/22/22 to 10/26/23.  MEDICATION WILL SHIP TO OFFICE. PATIENT MAY CALL COMPANY TO FOLLOW UP ON SHIPMENT AND ENROLLMENT.  Phone: [phone]

## 2022-10-23 NOTE — Progress Notes (Signed)
Patient here today for BP check.      Last BP was on 10/14/2022 and was 157/78.  BP today is 148/66 with a pulse of 92.    BP rechecked after 15 minutes and 136/64.  Checked BP in left arm with large cuff.    Symptoms present: None.   Patient last took BP med Amlodipine, Carvedilol and Hydralazine today.  Routed note to PCP.

## 2022-10-23 NOTE — Telephone Encounter (Signed)
Returned wifes phone call.  Informed her that [name] novo nordisk re-enrollment was approved and medications should ship to office in the next few weeks. In the meantime he will need to continue to get what he can afford from the pharmacy since we dont have samples of these medications.   Wife expressed that his insurance will be changing in January. Suggested she call me next week to run a test claim on the insulin costs while he waits on medication. Wife agreed.

## 2022-10-27 ENCOUNTER — Other Ambulatory Visit (HOSPITAL_COMMUNITY): Payer: Self-pay

## 2022-10-27 ENCOUNTER — Other Ambulatory Visit: Payer: Self-pay | Admitting: Student

## 2022-10-27 DIAGNOSIS — E119 Type 2 diabetes mellitus without complications: Secondary | ICD-10-CM

## 2022-10-27 NOTE — Telephone Encounter (Signed)
Pts wife called to follow up on refill request.   They're requesting another pen (22ml) in the meantime while waiting on [place] order to ship to office.

## 2022-10-27 NOTE — Telephone Encounter (Signed)
Spoke to patient's wife she stated patient was approved for Eliquis.

## 2022-10-30 ENCOUNTER — Ambulatory Visit: Payer: Medicare HMO | Admitting: Podiatry

## 2022-11-02 ENCOUNTER — Other Ambulatory Visit: Payer: Self-pay | Admitting: Vascular Surgery

## 2022-11-04 NOTE — Telephone Encounter (Signed)
Pen needles on counter as well

## 2022-11-04 NOTE — Telephone Encounter (Signed)
Informed wife that novo nordisk shipment is ready for pickup  2 boxes of novolog and 1 box of tresiba are labeled and ready in the med room fridge.

## 2022-11-06 ENCOUNTER — Ambulatory Visit: Payer: Medicare HMO | Admitting: Podiatry

## 2022-11-11 ENCOUNTER — Ambulatory Visit: Payer: Medicare HMO | Admitting: Neurology

## 2022-11-20 ENCOUNTER — Other Ambulatory Visit: Payer: Self-pay | Admitting: Student

## 2022-11-23 ENCOUNTER — Ambulatory Visit (HOSPITAL_BASED_OUTPATIENT_CLINIC_OR_DEPARTMENT_OTHER): Admission: RE | Admit: 2022-11-23 | Payer: Medicare HMO | Source: Ambulatory Visit

## 2022-11-25 ENCOUNTER — Other Ambulatory Visit: Payer: Self-pay | Admitting: Student

## 2022-11-25 ENCOUNTER — Encounter (HOSPITAL_BASED_OUTPATIENT_CLINIC_OR_DEPARTMENT_OTHER): Payer: Self-pay

## 2022-11-25 ENCOUNTER — Ambulatory Visit (HOSPITAL_BASED_OUTPATIENT_CLINIC_OR_DEPARTMENT_OTHER)
Admission: RE | Admit: 2022-11-25 | Discharge: 2022-11-25 | Disposition: A | Discharge status: Home / Self Care | Payer: Medicare HMO | Source: Ambulatory Visit | Attending: Acute Care | Admitting: Acute Care

## 2022-11-25 DIAGNOSIS — R911 Solitary pulmonary nodule: Secondary | ICD-10-CM

## 2022-11-25 DIAGNOSIS — Z87891 Personal history of nicotine dependence: Secondary | ICD-10-CM | POA: Insufficient documentation

## 2022-11-25 DIAGNOSIS — J439 Emphysema, unspecified: Secondary | ICD-10-CM | POA: Diagnosis not present

## 2022-11-25 DIAGNOSIS — R918 Other nonspecific abnormal finding of lung field: Secondary | ICD-10-CM | POA: Diagnosis not present

## 2022-11-26 ENCOUNTER — Telehealth: Payer: Self-pay | Admitting: Acute Care

## 2022-11-26 ENCOUNTER — Other Ambulatory Visit: Payer: Self-pay

## 2022-11-26 ENCOUNTER — Telehealth: Payer: Self-pay | Admitting: Adult Health

## 2022-11-26 DIAGNOSIS — R911 Solitary pulmonary nodule: Secondary | ICD-10-CM

## 2022-11-26 DIAGNOSIS — Z87891 Personal history of nicotine dependence: Secondary | ICD-10-CM

## 2022-11-26 NOTE — Telephone Encounter (Signed)
Order for 3 month LDCT nodule follow up placed and results/plan faxed to PCP

## 2022-11-26 NOTE — Telephone Encounter (Signed)
Wife is returning your call Kyle [name]  Please call her at   [phone]  Thank you

## 2022-11-26 NOTE — Telephone Encounter (Signed)
I have attempted to call the patient with the results of their  Low Dose CT Chest Lung cancer screening scan. There was no answer. I have left a HIPPA compliant VM requesting the patient call the office for the scan results. I included the office contact information in the message. We will await his return call. If no return call we will continue to call until patient is contacted.    I will call the patient again tomorrow if he does not call before.  Kyle [name], go ahead and place order for 3 month follow up screening scan and fax results to PCP. Tammy do you want to call him, see him in the office or would you prefer I call and see if he has been sick and needs treatment? I have had Kyle [name] order the 3 month follow up scan.  Thanks

## 2022-11-26 NOTE — Telephone Encounter (Signed)
Call report for patient

## 2022-11-26 NOTE — Telephone Encounter (Signed)
Called and spoke to [facility] [employer] and call report on CT Lung Cancer Screening:  IMPRESSION: 1. Lung-RADS 4A, suspicious. Follow up low-dose chest CT without contrast in 3 months (please use the following order, "CT CHEST LCS NODULE FOLLOW-UP W/O CM") is recommended. Alternatively, PET may be considered when there is a solid component 73mm or larger. Although the previously described pulmonary nodules are similar, there are new bilateral pulmonary nodules as detailed above. Given interval development, these could be infectious or inflammatory but are technically indeterminate. Consider antibiotic therapy prior to CT follow-up. 2. Aortic atherosclerosis (ICD10-I70.0), coronary artery atherosclerosis and emphysema (ICD10-J43.9). 3. Pulmonary artery enlargement suggests pulmonary arterial hypertension. 4. Esophageal air fluid level suggests dysmotility or gastroesophageal reflux. 5. Aortic valvular calcifications. Consider echocardiography to evaluate for valvular dysfunction. 6. Mild bilateral gynecomastia.

## 2022-11-27 ENCOUNTER — Telehealth: Payer: Self-pay | Admitting: Acute Care

## 2022-11-27 NOTE — Telephone Encounter (Signed)
Judson Roch called and spoke to wife on the phone 11/27/2022. Went over results of CT scan with her and patient. New order was placed by [facility] for a LDCT scan for 3 months. Patient and wife is aware per phone note from [place]. Nothing further needed at this time

## 2022-11-27 NOTE — Telephone Encounter (Signed)
I have called the patient's wife , lois,  with the results of his low dose CT Chest. This is a 6 month follow up for a LR 3 that was done 04/2023.I explained that the nodules we were following had resolved , but that there are 2 new nodules that we will need to evaluate in 3 months. Pt. States her husband has not been sick, and has had no discolored secretions or fever. As he has not been sick, there is no indication for  antimicrobial treatment.  Plan is for a 3 month follow up LDCT. Pt wife is in agreement with this plan. Denise, please order 3 month follow up low dose CT Chest and fax results with plan to PCP. Also please fax to Dr. Peter Martinique , cardiology per his wife's request. Thanks so much

## 2022-11-30 ENCOUNTER — Telehealth: Payer: Self-pay | Admitting: Student

## 2022-11-30 ENCOUNTER — Other Ambulatory Visit: Payer: Self-pay

## 2022-11-30 DIAGNOSIS — E119 Type 2 diabetes mellitus without complications: Secondary | ICD-10-CM

## 2022-11-30 MED ORDER — INSULIN PEN NEEDLE 32G X 4 MM MISC: 1 refills | Status: DC

## 2022-11-30 NOTE — Telephone Encounter (Signed)
-----   Message from [name], RN sent at 11/26/2022  1:37 PM EST ----- Plan for 3 months nodule follow up

## 2022-11-30 NOTE — Telephone Encounter (Signed)
Called patient-discussed with Wife Lucita Ferrara (confirmed DOB of patient)- CT lung with suspicious nodule-needs 3 month follow up. Will message our team to see if we can set this up.

## 2022-12-01 NOTE — Telephone Encounter (Signed)
Order placed for 3 months follow up LDCT and results faxed to PCP and Dr. Martinique

## 2022-12-02 ENCOUNTER — Telehealth: Payer: Self-pay

## 2022-12-02 NOTE — Telephone Encounter (Signed)
Spoke with patients caregiver and informed her of the time and date of the Ct scan. She understood. Salvatore Marvel, CMA

## 2022-12-02 NOTE — Telephone Encounter (Signed)
-----   Message from Gerrit Heck, MD sent at 11/30/2022  9:41 AM EST ----- Could we make sure patient is set up for repeat CT scan in 3 months. Believe order is already placed Thank you! Mayuri ----- Message ----- From: [name], RN Sent: 11/26/2022   1:37 PM EST To: Gerrit Heck, MD  Plan for 3 months nodule follow up

## 2022-12-02 NOTE — Telephone Encounter (Signed)
Made repeat Ct scan for chest at [facility] for Tue. May 7th at 1:00. Salvatore Marvel, CMA

## 2022-12-04 ENCOUNTER — Ambulatory Visit: Payer: Medicare HMO | Admitting: Neurology

## 2022-12-28 ENCOUNTER — Telehealth: Payer: Self-pay | Admitting: Pulmonary Disease

## 2022-12-28 NOTE — Telephone Encounter (Signed)
Patient is waiting to get re approved for trelegy assistance and is down to only 1 more puff of trelegy inhaler. Patient requesting samples to be picked up at the [place] location if possible.

## 2022-12-28 NOTE — Telephone Encounter (Signed)
Talked to his wife let her know we don't have trelegy 100 samples at [facility] location

## 2023-01-06 ENCOUNTER — Ambulatory Visit (INDEPENDENT_AMBULATORY_CARE_PROVIDER_SITE_OTHER): Payer: Medicare HMO | Admitting: Pulmonary Disease

## 2023-01-06 ENCOUNTER — Telehealth: Payer: Self-pay | Admitting: Pulmonary Disease

## 2023-01-06 ENCOUNTER — Encounter (HOSPITAL_BASED_OUTPATIENT_CLINIC_OR_DEPARTMENT_OTHER): Payer: Self-pay | Admitting: Pulmonary Disease

## 2023-01-06 VITALS — BP 126/64 | HR 69 | Ht 71.0 in | Wt 163.4 lb

## 2023-01-06 DIAGNOSIS — R918 Other nonspecific abnormal finding of lung field: Secondary | ICD-10-CM

## 2023-01-06 DIAGNOSIS — J4489 Other specified chronic obstructive pulmonary disease: Secondary | ICD-10-CM

## 2023-01-06 DIAGNOSIS — J439 Emphysema, unspecified: Secondary | ICD-10-CM

## 2023-01-06 DIAGNOSIS — J9611 Chronic respiratory failure with hypoxia: Secondary | ICD-10-CM | POA: Diagnosis not present

## 2023-01-06 MED ORDER — TRELEGY ELLIPTA 200-62.5-25 MCG/ACT IN AEPB: 1.0000 | INHALATION_SPRAY | Freq: Every day | RESPIRATORY_TRACT | 0 refills | Status: DC

## 2023-01-06 NOTE — Assessment & Plan Note (Signed)
Encouraged him to use oxygen on exertion  While during sleep and at rest he will monitor for this pulse oximeter and maintain above 90% may need anywhere from 3 to 4 L. Asked him to use saline drops to prevent nosebleeds. Will also provide him with humidifier for his oxygen He would benefit from a rehab program he is interested

## 2023-01-06 NOTE — Assessment & Plan Note (Signed)
New nodules have been noted on his most recent CT scan from January.  He was not sick when he had the scan. 72-monthfollow-up low-dose CT chest has been scheduled for May.  I am hopeful that these will be benign

## 2023-01-06 NOTE — Assessment & Plan Note (Signed)
Continue Trelegy. Sample for Trelegy 200 was provided. We will consult pharmacy to see if he has cheaper alternatives such as Advair plus Spiriva combination or other. We discussed COPD action plan and signs and symptoms for the same

## 2023-01-06 NOTE — Patient Instructions (Signed)
X sample of trelegy  Alternatives would be advair + spiriva or BReZTRI  X Humidifier  Ct scan in may to follow on nodules

## 2023-01-06 NOTE — Telephone Encounter (Signed)
Pt came in today for a follow up appt. Pt is currently on Trelegy 100. Today during the visit, pt was given a sample of Trelegy due to the cost. Info per Dr. Elsworth Soho:  Alternatives would be advair + spiriva or BReZTRI    We need to know which inhaler/s are preferred with pt's insurance so we can get this fixed for him. Routing to prior auth team for review. Please advise.

## 2023-01-06 NOTE — Progress Notes (Signed)
   Subjective:    Patient ID: Kyle [name], male    DOB: [birth year], 72 y.o.   MRN: [id number]  HPI  71 yo ex smoker for follow-up of emphysema and chronic respiratory failure on oxygen PMH - cardiomyopathy, HFrEF PAD s/p right femoral peroneal bypass  Quit 2019, 64 Pyrs  Chief Complaint  Patient presents with   Follow-up    Pt states he has been doing okay since last visit and denies any complaints.   Last seen by me 01/2022 - has seen APP x 2 since He arrives accompanied by his wife today, oxygen saturation is 86% on room air, he does not use his POC when he is outside.  This is also been documented in previous office visits. He is compliant with using oxygen in the day up to 4.5 L is when he is at home and during sleep.  He reports occasional nosebleeds.  He is also on Eliquis.  Trelegy is very expensive he was getting this directly from the company but is now having to pay a co-pay of 1 $60.  His Eliquis also cost  $150.  He request samples of alternatives.  We reviewed his recent CT scans and new nodules have been noted  His breathing otherwise is at baseline  Significant tests/ events reviewed   LDCT 11/25/22 >> new RUL nodules 38mm & 4.5 mm, new LUL nodule 28mm LDCT 04/2021 RADS2 - RLL nodule unchanged   LDCT 02/2020 - lung RADS 2.  Moderate to advanced changes of emphysema.  And tiny nodule in the left base at 4.7 mm.   11/20/2018-CT head without contrast- sinuses normal   12/16/2018-spirometry- FVC 1.7 (43% predicted), ratio 64, FEV1 1.1 (36% predicted), consistent with severe airway obstruction     03/2021 echo LVEF 45 to 50%, RVSP 55 11/21/2018-echocardiogram-LV ejection fraction 40 to 45%, diffuse hypokinesis, right ventricle moderately dilated, systolic function was moderately reduced, PA P pressure 53  Review of Systems neg for any significant sore throat, dysphagia, itching, sneezing, nasal congestion or excess/ purulent secretions, fever, chills, sweats, unintended wt  loss, pleuritic or exertional cp, hempoptysis, orthopnea pnd or change in chronic leg swelling. Also denies presyncope, palpitations, heartburn, abdominal pain, nausea, vomiting, diarrhea or change in bowel or urinary habits, dysuria,hematuria, rash, arthralgias, visual complaints, headache, numbness weakness or ataxia.     Objective:   Physical Exam  Gen. Pleasant, well-nourished, in no distress ENT - no thrush, no pallor/icterus,no post nasal drip, no bleeding areas Neck: No JVD, no thyromegaly, no carotid bruits Lungs: no use of accessory muscles, no dullness to percussion, decreased bilateral without rales or rhonchi  Cardiovascular: Rhythm regular, heart sounds  normal, no murmurs or gallops, no peripheral edema Musculoskeletal: No deformities, no cyanosis or clubbing        Assessment & Plan:

## 2023-01-07 ENCOUNTER — Other Ambulatory Visit (HOSPITAL_COMMUNITY): Payer: Self-pay

## 2023-01-07 NOTE — Telephone Encounter (Signed)
Rigoberto Noel, MD  You3 hours ago (12:13 PM)    Please let his wife know that once the deductible is covered, his co-pay should only be $47 on Trelegy and we will continue this for now     Attempted to call pt's spouse but unable to reach. Left message for her to return call so I can relay the info per Dr. Elsworth Soho and pharmacy team.

## 2023-01-07 NOTE — Telephone Encounter (Addendum)
Kyle [name] is showing as covered at this time per test claim for $47.00. Trelegy is also being covered due to refill too soon message from plan based on last fill on 03/09.  This could be due to a deductible not being met so the insurance is applying said deductible to the cost of the Trelegy; so because of that the Kyle [name] is being shown as the regular co-pay.

## 2023-01-07 NOTE — Telephone Encounter (Signed)
Routing to Dr. Elsworth Soho.

## 2023-01-13 ENCOUNTER — Other Ambulatory Visit: Payer: Self-pay

## 2023-01-13 ENCOUNTER — Other Ambulatory Visit: Payer: Self-pay | Admitting: Student

## 2023-01-13 ENCOUNTER — Telehealth: Payer: Self-pay | Admitting: Pharmacist

## 2023-01-13 DIAGNOSIS — I482 Chronic atrial fibrillation, unspecified: Secondary | ICD-10-CM

## 2023-01-13 MED ORDER — APIXABAN 5 MG PO TABS: 5.0000 mg | ORAL_TABLET | Freq: Two times a day (BID) | ORAL | 1 refills | Status: DC

## 2023-01-13 NOTE — Telephone Encounter (Signed)
Call from patient's spouse.  She reports patient no longer taking Levemir (insulin detemir).  She shared that the switch from Levemir to [place] (insulin degludec) was made at recent Medication Assistance Application with [facility].     No need for visit as exchange of insulin from discontinued Levemir to [place] has been completed.   I shared that I would cancel previously scheduled appointment for 3/22 with me.   Spouse verbalized appreciation of help and clarification.

## 2023-01-13 NOTE — Telephone Encounter (Signed)
Received faxed refill request for Eliquis from [facility].  Pt last saw Dr Martinique 02/12/22, last labs 10/14/22 Creat 1.88, age 72, weight 74.1kg, based on specified criteria pt is on appropriate dosage of Eliquis 5mg  BID for afib.  Will refill rx.

## 2023-01-13 NOTE — Addendum Note (Signed)
Addended by: Brynda Peon on: 01/13/2023 11:48 AM   Modules accepted: Orders

## 2023-01-13 NOTE — Telephone Encounter (Signed)
Reviewed and agree with Dr Koval's plan.   

## 2023-01-15 ENCOUNTER — Ambulatory Visit: Payer: Medicare HMO | Admitting: Pharmacist

## 2023-01-18 ENCOUNTER — Telehealth: Payer: Self-pay | Admitting: Cardiology

## 2023-01-18 NOTE — Telephone Encounter (Signed)
*  STAT* If patient is at the pharmacy, call can be transferred to refill team.   1. Which medications need to be refilled? (please list name of each medication and dose if known) Fluticasone-Umeclidin-Vilant (TRELEGY ELLIPTA) 100-62.5-25 MCG/ACT AEPB   Fluticasone-Umeclidin-Vilant (TRELEGY ELLIPTA) 200-62.5-25 MCG/ACT AEPB   2. Which pharmacy/location (including street and city if local pharmacy) is medication to be sent to? [place], [place]   3. Do they need a 30 day or 90 day supply? [place]

## 2023-01-20 ENCOUNTER — Telehealth (HOSPITAL_BASED_OUTPATIENT_CLINIC_OR_DEPARTMENT_OTHER): Payer: Self-pay | Admitting: Pulmonary Disease

## 2023-01-20 NOTE — Telephone Encounter (Signed)
Has not received any Trelegy. Script was sent through Cardiology dept. Please advise and call pts wife with an update if applicable.  Last encounter:    *STAT* If patient is at the pharmacy, call can be transferred to refill team.     1. Which medications need to be refilled? (please list name of each medication and dose if known) Fluticasone-Umeclidin-Vilant (TRELEGY ELLIPTA) 100-62.5-25 MCG/ACT AEPB    Fluticasone-Umeclidin-Vilant (TRELEGY ELLIPTA) 200-62.5-25 MCG/ACT AEPB    2. Which pharmacy/location (including street and city if local pharmacy) is medication to be sent to? [place], [place]    3. Do they need a 30 day or 90 day supply? [place]

## 2023-01-21 MED ORDER — TRELEGY ELLIPTA 200-62.5-25 MCG/ACT IN AEPB: 1.0000 | INHALATION_SPRAY | Freq: Every day | RESPIRATORY_TRACT | 4 refills | Status: DC

## 2023-01-21 NOTE — Telephone Encounter (Signed)
Refills sent!! Nothing further needed  ?

## 2023-01-22 ENCOUNTER — Other Ambulatory Visit: Payer: Self-pay | Admitting: Cardiology

## 2023-01-25 MED ORDER — TRELEGY ELLIPTA 200-62.5-25 MCG/ACT IN AEPB: 1.0000 | INHALATION_SPRAY | Freq: Every day | RESPIRATORY_TRACT | 3 refills | Status: DC

## 2023-01-25 NOTE — Telephone Encounter (Signed)
Spoke with Lucita Ferrara pt's wife (per DPR) and verified pharmacy as [facility] for pt's 90 day supply of Trelegy. Refill order was placed.

## 2023-01-30 NOTE — Progress Notes (Unsigned)
Kyle [name] Date of Birth: [birth year] Medical Record #[id number]  History of Present Illness: Kyle [name] is seen for follow up CHF.  Last seen in February 2022. He has a history of systolic CHF with EF of 20-25% in May 2012. Coronary anatomy was normal by cath. He has a bicuspid AV with moderate AS/AI. He had moderate pulmonary HTN. He was treated medically and one of my old notes stated that his LV function showed marked improvement with  Echo in 2011 showing improvement in EF to 55-60%.   Unfortunately , he was lost to follow up and was not taking medication for over 2 years. He also has a history of HTN, hyperlipidemia, and PAD s/p right fem-pop BPG.   When seen in October 2015 he had evidence of recurrent CHF with severe HTN. Echo showed an EF of 20-25% with moderate AS/AI. He was started back on medication with lasix, carvedilol, and quinapril. He did not come back for follow up as instructed. He was seen in Jan. 2016 for surgical clearance for vascular surgery.  He was seen by Dr. Edilia Bo for claudication and was found to have severe PAD. Revascularization recommended. He underwent aortobifemoral BPG in February 2016. Was seen by me in April 2019- had missed multiple appointments. He did have an Echo in July 2018 showing normalization of LV function with EF 55%.  He is  diabetic and on insulin.  He was admitted in January 2020 with hypotension and shock. Had malaise, N/V and sweats. Treated with antibiotics. Had respiratory failure with hypoxia. Sent home with oxygen. He had Afib initially then converted to NSR. Started on Eliquis. Continued on Coreg. Had AKI with creatinine up to 2.7 that improved to 1.03. LFTs were elevated. Echo showed EF 40-45%. There was RV dysfunction with moderate pulmonary HTN. Moderate AI with mild AS. CT negative for PE but showed advanced emphysema.   He presented in June 2020 with progressive right leg pain and nonhealing wound on his foot. Angiography showed  Occluded graft to the right leg and only peroneal run off. He underwent the following surgery on 04/24/19 by Dr Arbie Cookey. Right femoral and repair with interposition 8 mm Hemashield graft from prior right limb of aortofemoral bypass into into deep femoral artery, #2 right femoral to peroneal bypass with translocated non-reversed great saphenous vein harvested from the left leg. He did not follow up with vascular surgery or cardiology post surgery. Has only followed up with pulmonary for COPD with hypoxemia and pulmonary HTN.   He was admitted 2/28-3/18/21 with abdominal pain, N/V, and diarrhea. He had acute renal failure and lactic acidosis of unclear etiology. He required mechanical ventilation for one day and intermittent dialysis but was not on dialysis at discharge. His acidosis cleared. He had encephalopathy. Cranial CT and MRI were negative. CT abd/pelvis was negative. He was switched from Eliquis to Coumadin at DC due to AKI. Lasix was held.  He was readmitted 3/27-3/31/21 with acute CHF. He responded to IV diuresis. He was DC on lasix 20 mg bid. DC creatinine was 2.52.   He was admitted again 4/26-4/30/21 with acute hyperglycemic hyperosmolar state.  Last creatinine 1.74. lasix was discontinued at discharge. He was seen by me in September 2021 and was doing well at that time.   Since his last visit he had repeat Echo in June 2022 showing EF 45-50%. Moderate AI.  He was seen by Dr Edilia Bo and had repeat angiogram in August 2022 showing patent bypass grafts.  On follow up today he is feeling very well. Only gets SOB when he rushes. No chest pain. No claudication. Denies any ankle swelling. Weight is down 12 lbs since Dec. Only taking lasix 20 mg daily. Is compliant with meds. Not smoking.    Allergies as of 02/03/2023   No Known Allergies      Medication List        Accurate as of January 30, 2023  7:52 AM. If you have any questions, ask your nurse or doctor.          Accu-Chek FastClix  Lancets Misc USE TO CHECK GLUCOSE AS INDICATED   Accu-Chek Guide test strip Generic drug: glucose blood TEST BLOOD SUGAR TWICE DAILY   albuterol 108 (90 Base) MCG/ACT inhaler Commonly known as: VENTOLIN HFA Inhale 2 puffs into the lungs every 6 (six) hours as needed for wheezing or shortness of breath.   amLODipine 5 MG tablet Commonly known as: NORVASC Take 1 tablet (5 mg total) by mouth at bedtime.   apixaban 5 MG Tabs tablet Commonly known as: Eliquis Take 1 tablet (5 mg total) by mouth 2 (two) times daily.   carvedilol 6.25 MG tablet Commonly known as: COREG TAKE 1 TABLET TWICE DAILY WITH MEALS (NEED MD APPOINTMENT)   Centrum Silver 50+Men Tabs Take 1 tablet by mouth daily with breakfast.   furosemide 40 MG tablet Commonly known as: LASIX TAKE 1/2 TABLET EVERY DAY   guaiFENesin 600 MG 12 hr tablet Commonly known as: MUCINEX Take 2 tablets (1,200 mg total) by mouth 2 (two) times daily.   hydrALAZINE 25 MG tablet Commonly known as: APRESOLINE TAKE 1 TABLET THREE TIMES DAILY (NEED APPOINTMENT WITH PCP FOR REFILLS)   Insulin Pen Needle 32G X 4 MM Misc Use as directed with insulin pen Strength: 32G X 4 MM   isosorbide mononitrate 30 MG 24 hr tablet Commonly known as: IMDUR TAKE 1 TABLET BY MOUTH ONCE DAILY.   levETIRAcetam 500 MG tablet Commonly known as: KEPPRA TAKE 1 TABLET EVERY DAY   NovoLOG FlexPen 100 UNIT/ML FlexPen Generic drug: insulin aspart INJECT 5 UNITS SUBCUTANEOUSLY THREE TIMES DAILY WITH MEALS   oxyCODONE 5 MG immediate release tablet Commonly known as: Roxicodone Take 1 tablet (5 mg total) by mouth every 6 (six) hours as needed for severe pain.   OXYGEN Inhale 4.5 L/min into the lungs continuous.   rosuvastatin 10 MG tablet Commonly known as: CRESTOR TAKE 1 TABLET AT BEDTIME   Trelegy Ellipta 200-62.5-25 MCG/ACT Aepb Generic drug: Fluticasone-Umeclidin-Vilant Inhale 1 puff into the lungs daily.   Evaristo Bury FlexTouch 100 UNIT/ML  FlexTouch Pen Generic drug: insulin degludec Inject 14 Units into the skin daily.         No Known Allergies  Past Medical History:  Diagnosis Date   Aortic insufficiency    MODERATE WITH A BICUSPID AORTIC VAVLE   Arterial occlusive disease    MULTILEVEL   CHF (congestive heart failure) (HCC)    COPD (chronic obstructive pulmonary disease) (HCC)    Dilated cardiomyopathy (HCC)    WITH EJECTION FRACTION DOWN TO 20-25%--WITH CONGESTIVE HEART FAILURE   Edema    LOWER EXTREMETIES   Hyperglycemia 01/2020   Hypertension    Normal coronary arteries 2009   Orthopnea    Peripheral arterial disease (HCC)    Pulmonary hypertension (HCC)     Past Surgical History:  Procedure Laterality Date   ABDOMINAL AORTAGRAM N/A 11/05/2014   Procedure: ABDOMINAL Ronny Flurry;  Surgeon: Chuck Hint, MD;  Location: [facility] CATH LAB;  Service: Cardiovascular;  Laterality: N/A;   ABDOMINAL AORTOGRAM W/LOWER EXTREMITY Bilateral 04/21/2019   Procedure: ABDOMINAL AORTOGRAM W/LOWER EXTREMITY;  Surgeon: Chuck Hintickson, Christopher S, MD;  [facility];  Service: Cardiovascular;  Laterality: Bilateral;   ABDOMINAL AORTOGRAM W/LOWER EXTREMITY Right 05/30/2021   Procedure: ABDOMINAL AORTOGRAM W/LOWER EXTREMITY;  Surgeon: Chuck Hintickson, Christopher S, MD;  [facility];  Service: Cardiovascular;  Laterality: Right;   AORTA - BILATERAL FEMORAL ARTERY BYPASS GRAFT N/A 12/18/2014   Procedure: AORTOBIFEMORAL BYPASS GRAFT;  Surgeon: Chuck Hinthristopher S Dickson, MD;  [facility];  Service: Vascular;  Laterality: N/A;   CARDIAC CATHETERIZATION  2009   Nl Cors, EF 20%   COLONOSCOPY     ENDARTERECTOMY FEMORAL Left 12/18/2014   Procedure: ENDARTERECTOMY FEMORAL;  Surgeon: Chuck Hinthristopher S Dickson, MD;  [facility];  Service: Vascular;  Laterality: Left;   FALSE ANEURYSM REPAIR Right 04/24/2019   Procedure: REPAIR OF RIGHT FEMORAL ARTERY PSEUDOANEURYSM;  Surgeon: Larina EarthlyEarly, Todd F, MD;  Location: [facility] OR;   Service: Vascular;  Laterality: Right;   FEMORAL-POPLITEAL BYPASS GRAFT  02/21/2011   right   FEMORAL-TIBIAL BYPASS GRAFT Right 04/24/2019   Procedure: REDO BYPASS GRAFT RIGHT FEMORAL-TIBIAL ARTERY USING LEFT LEG VEIN & HEMASHIELD GOLD 8mm GRAFT;  Surgeon: Larina EarthlyEarly, Todd F, MD;  Location: [facility] OR;  Service: Vascular;  Laterality: Right;   IR FLUORO GUIDE CV LINE RIGHT  12/28/2019   IR REMOVAL TUN CV CATH W/O FL  01/11/2020   IR US GUIDE VASC ACCESS RIGHT  12/28/2019   OTHER SURGICAL HISTORY     POST RIGHT FEMOROPOPLITEAL BYPASS GRAFT   PERIPHERAL VASCULAR CATHETERIZATION  11/05/2014   Procedure: LOWER EXTREMITY ANGIOGRAPHY;  Surgeon: Chuck Hinthristopher S Dickson, MD;  [facility];  Service: Cardiovascular;;   VEIN HARVEST Left 04/24/2019   Procedure: LEFT LEG SAPHENOUS VEIN HARVEST;  Surgeon: Larina EarthlyEarly, Todd F, MD;  Location: [facility] OR;  Service: Vascular;  Laterality: Left;    Social History   Socioeconomic History   Marital status: Married    Spouse name: Not on file   Number of children: 1   Years of education: Not on file   Highest education level: Not on file  Occupational History   Occupation: retired    Associate Professormployer: [employer]  Tobacco Use   Smoking status: Former    Packs/day: 1.00    Years: 40.00    Additional pack years: 0.00    Total pack years: 40.00    Types: Cigarettes    Quit date: 10/15/2018    Years since quitting: 4.2    Passive exposure: Never   Smokeless tobacco: Never   Tobacco comments:    quit 2010 and quit again 2019  Vaping Use   Vaping Use: Never used  Substance and Sexual Activity   Alcohol use: No    Alcohol/week: 0.0 standard drinks of alcohol   Drug use: No   Sexual activity: Not on file  Other Topics Concern   Not on file  Social History Narrative   Not on file   Social Determinants of Health   Financial Resource Strain: Not on file  Food Insecurity: Not on file  Transportation Needs: Not on file  Physical Activity: Not on file  Stress: Not on  file  Social Connections: Not on file    Family History  Problem Relation Age of Onset   Diabetes Mother    Hyperlipidemia Sister    Hypertension Sister  Review of Systems: As noted in HPI.   All other systems were reviewed and are negative.  Physical Exam: There were no vitals taken for this visit. There were no vitals filed for this visit.    GENERAL:  Well appearing, thin BM in NAD HEENT:  PERRL, EOMI, sclera are clear. Oropharynx is clear. NECK:  No jugular venous distention, carotid upstroke brisk and symmetric, no bruits, no thyromegaly or adenopathy LUNGS:  Clear to auscultation bilaterally CHEST:  Unremarkable HEART:  RRR,  PMI not displaced or sustained,S1 and S2 within normal limits, no S3, no S4: no clicks, no rubs, gr 1/6 systolic and diastolic murmur RUSB.  ABD:  Soft, nontender. BS +, no masses or bruits. No hepatomegaly, no splenomegaly EXT:  Pedal pulses are weak.   no edema, no cyanosis no clubbing SKIN:  Warm and dry.  No rashes NEURO:  Alert and oriented x 3. Cranial nerves II through XII intact. PSYCH:  Cognitively intact   LABORATORY DATA: . Lab Results  Component Value Date   WBC 9.5 10/10/2022   HGB 15.3 10/10/2022   HCT 47.3 10/10/2022   PLT 128 (L) 10/10/2022   GLUCOSE 185 (H) 10/14/2022   CHOL 142 02/12/2022   TRIG 123 02/12/2022   HDL 44 02/12/2022   LDLCALC 76 02/12/2022   ALT 17 02/12/2022   AST 25 02/12/2022   NA 145 (H) 10/14/2022   K 4.4 10/14/2022   CL 105 10/14/2022   CREATININE 1.88 (H) 10/14/2022   BUN 24 10/14/2022   CO2 24 10/14/2022   TSH 0.382 04/08/2021   INR 1.2 05/30/2021   HGBA1C 6.0 09/04/2022    Labs dated 07/28/17: BUN 29, creatinine 1.03. Other chemistries normal. A1c 6.2%. Cholesterol 137, Triglycerides 63, HDL 43, LDL 81, TSH normal. Labs dated 12/21/17: cholesterol 123, triglycerides 64, HDL 41, LDL 69. A1c 6.3%. CMET and CBC normal. TSH normal. Dated 10/22/20: A1c 6.7%. creatinine 1.41, cholesterol 149,  triglycerides 170, HDL 39, LDL 81. TSH, CBC, other chemistries normal.   Ecg today shows NSR with rate 86, LVH with repolarization abnormality. I have personally reviewed and interpreted this study.   Echo: 05/06/17:  Study Conclusions   - Left ventricle: The cavity size was normal. Wall thickness was   increased in a pattern of mild LVH. Systolic function was normal.   The estimated ejection fraction was in the range of 50% to 55%.   Wall motion was normal; there were no regional wall motion   abnormalities. Doppler parameters are consistent with abnormal   left ventricular relaxation (grade 1 diastolic dysfunction). - Aortic valve: There was mild stenosis. There was mild to moderate   regurgitation. - Mitral valve: Calcified annulus. - Left atrium: The atrium was mildly dilated. - Right ventricle: The cavity size was mildly dilated. - Right atrium: The atrium was mildly dilated. - Pulmonary arteries: Systolic pressure was moderately increased.   Impressions:   - Normal LV systolic function; mild LVH; mild diastolic   dysfunction; calcified aortic valve with mild AS (mean gradient   18 mmHg) and mild to moderate AI; mild LAE; mild RAE and RVE;   trace TR with moderately elevated pulmonary pressure.  Echo 11/21/18: Study Conclusions   - Left ventricle: The cavity size was normal. Systolic function was   mildly to moderately reduced. The estimated ejection fraction was   in the range of 40% to 45%. Diffuse hypokinesis. The study is not   technically sufficient to allow evaluation of LV diastolic  function. - Ventricular septum: The contour showed diastolic flattening. - Aortic valve: Valve mobility was restricted. There was mild   stenosis. There was moderate regurgitation. - Right ventricle: The cavity size was moderately dilated. Wall   thickness was normal. Systolic function was moderately reduced. - Right atrium: The atrium was severely dilated. - Tricuspid valve: There  was moderate regurgitation. - Pulmonary arteries: Systolic pressure was moderately increased.   PA peak pressure: 53 mm Hg (S).   Impressions:   - Compared to the prior study, there has been no significant   interval change.   Echo 12/13/19: IMPRESSIONS     1. Severe concentric LVH, consider evaluation for infiltrative  cardiomyopathies such as cardiac amyloidosis.   2. Impaired GLS: -14.6%. Left ventricular ejection fraction, by  estimation, is 65 to 70%. The left ventricle has normal function. The left  ventricle has no regional wall motion abnormalities. There is severe  concentric left ventricular hypertrophy. Left   ventricular diastolic parameters are consistent with Grade I diastolic  dysfunction (impaired relaxation).   3. Right ventricular systolic function is normal. The right ventricular  size is normal. There is moderately elevated pulmonary artery systolic  pressure. The estimated right ventricular systolic pressure is 63.2 mmHg.   4. The mitral valve is normal in structure and function. No evidence of  mitral valve regurgitation. No evidence of mitral stenosis.   5. The aortic valve is normal in structure and function. Aortic valve  regurgitation is moderate. Mild to moderate aortic valve stenosis.   6. The inferior vena cava is normal in size with greater than 50%  respiratory variability, suggesting right atrial pressure of 3 mmHg.   Echo 04/08/21: IMPRESSIONS     1. Hypokinesis of the inferolateral wall with overall mild LV  dysfunction.   2. Left ventricular ejection fraction, by estimation, is 45 to 50%. The  left ventricle has mildly decreased function. The left ventricle  demonstrates regional wall motion abnormalities (see scoring  diagram/findings for description). There is moderate  left ventricular hypertrophy. Left ventricular diastolic parameters are  consistent with Grade I diastolic dysfunction (impaired relaxation).   3. Right ventricular systolic  function is normal. The right ventricular  size is normal. There is moderately elevated pulmonary artery systolic  pressure.   4. Left atrial size was mildly dilated.   5. Right atrial size was mildly dilated.   6. The mitral valve is normal in structure. Mild mitral valve  regurgitation. No evidence of mitral stenosis.   7. The aortic valve is normal in structure. Aortic valve regurgitation is  moderate. Mild to moderate aortic valve stenosis.   8. The inferior vena cava is dilated in size with >50% respiratory  variability, suggesting right atrial pressure of 8 mmHg.     Assessment / Plan: 1. Chronic systolic CHF.  He has a history of cardiomyopathy- nonischemic. EF normalized in past when compliant with medication. Most recent Echo in 2022 showed improved EF to 45-50% with moderate LVH. Reinforced importance of compliance with medical therapy. Low sodium diet. Continue  Coreg, hydralazine. Appears to be euvolemic. On lasix 20 mg daily. Avoid ARB/ACEi due to CKD. Will update labs today   2. HTN  3. PAD s/p redo right fem-pop bypass last June 2020. S/p aortobifemoral BPG Feb. 2016. Angiography in August 2022 showed patent grafts. Dopplers in August 2023 stable.   4. Hyperlipidemia. on Crestor . Will check labs today. Goal LDL < 70  5. Biscuspid AV with mild AS/moderate AI.  Will monitor.   6. Tobacco abuse- quit   7. DM now on insulin per primary care. Will check A1c   8. Advanced emphysema. On home oxygen intermittently. On Trelegy  9. Afib in setting of sepsis. In NSR now. Continue Eliquis. No clinical recurrence.   10. CKD stage 3a  Follow up in one year

## 2023-02-03 ENCOUNTER — Ambulatory Visit: Payer: Medicare HMO | Attending: Cardiology | Admitting: Cardiology

## 2023-02-03 ENCOUNTER — Encounter: Payer: Self-pay | Admitting: Cardiology

## 2023-02-03 VITALS — BP 132/64 | HR 71 | Ht 71.0 in | Wt 165.2 lb

## 2023-02-03 DIAGNOSIS — I739 Peripheral vascular disease, unspecified: Secondary | ICD-10-CM

## 2023-02-03 DIAGNOSIS — I482 Chronic atrial fibrillation, unspecified: Secondary | ICD-10-CM | POA: Diagnosis not present

## 2023-02-03 DIAGNOSIS — I5022 Chronic systolic (congestive) heart failure: Secondary | ICD-10-CM | POA: Diagnosis not present

## 2023-02-03 DIAGNOSIS — E782 Mixed hyperlipidemia: Secondary | ICD-10-CM | POA: Diagnosis not present

## 2023-02-03 DIAGNOSIS — N1831 Chronic kidney disease, stage 3a: Secondary | ICD-10-CM | POA: Diagnosis not present

## 2023-02-03 NOTE — Patient Instructions (Signed)
Medication Instructions:  Continue all medications *If you need a refill on your cardiac medications before your next appointment, please call your pharmacy*   Lab Work: Bmet,lipid and liver panels today   Testing/Procedures: Echo   Follow-Up: At [facility], you and your health needs are our priority.  As part of our continuing mission to provide you with exceptional heart care, we have created designated Provider Care Teams.  These Care Teams include your primary Cardiologist (physician) and Advanced Practice Providers (APPs -  Physician Assistants and Nurse Practitioners) who all work together to provide you with the care you need, when you need it.  We recommend signing up for the patient portal called "MyChart".  Sign up information is provided on this After Visit Summary.  MyChart is used to connect with patients for Virtual Visits (Telemedicine).  Patients are able to view lab/test results, encounter notes, upcoming appointments, etc.  Non-urgent messages can be sent to your provider as well.   To learn more about what you can do with MyChart, go to [email or web address].    Your next appointment:  1 year   Call in Jan to schedule April appointment     Provider: Dr.Jordan

## 2023-02-04 LAB — BASIC METABOLIC PANEL
BUN/Creatinine Ratio: 13 (ref 10–24)
BUN: 22 mg/dL (ref 8–27)
CO2: 27 mmol/L (ref 20–29)
Calcium: 10 mg/dL (ref 8.6–10.2)
Chloride: 104 mmol/L (ref 96–106)
Creatinine, Ser: 1.69 mg/dL — ABNORMAL HIGH (ref 0.76–1.27)
Glucose: 113 mg/dL — ABNORMAL HIGH (ref 70–99)
Potassium: 4.5 mmol/L (ref 3.5–5.2)
Sodium: 145 mmol/L — ABNORMAL HIGH (ref 134–144)
eGFR: 43 mL/min/{1.73_m2} — ABNORMAL LOW (ref >59)

## 2023-02-04 LAB — HEPATIC FUNCTION PANEL
ALT: 16 IU/L (ref 0–44)
AST: 22 IU/L (ref 0–40)
Albumin: 4.3 g/dL (ref 3.9–4.9)
Alkaline Phosphatase: 83 IU/L (ref 44–121)
Bilirubin Total: 0.4 mg/dL (ref 0.0–1.2)
Bilirubin, Direct: 0.14 mg/dL (ref 0.00–0.40)
Total Protein: 7.4 g/dL (ref 6.0–8.5)

## 2023-02-04 LAB — LIPID PANEL
Chol/HDL Ratio: 3.6 ratio (ref 0.0–5.0)
Cholesterol, Total: 144 mg/dL (ref 100–199)
HDL: 40 mg/dL (ref >39)
LDL Chol Calc (NIH): 71 mg/dL (ref 0–99)
Triglycerides: 198 mg/dL — ABNORMAL HIGH (ref 0–149)
VLDL Cholesterol Cal: 33 mg/dL (ref 5–40)

## 2023-02-11 ENCOUNTER — Other Ambulatory Visit: Payer: Self-pay | Admitting: Student

## 2023-02-11 ENCOUNTER — Other Ambulatory Visit: Payer: Self-pay | Admitting: Cardiology

## 2023-02-11 DIAGNOSIS — I10 Essential (primary) hypertension: Secondary | ICD-10-CM

## 2023-02-15 ENCOUNTER — Other Ambulatory Visit: Payer: Self-pay

## 2023-02-15 DIAGNOSIS — E119 Type 2 diabetes mellitus without complications: Secondary | ICD-10-CM

## 2023-02-15 MED ORDER — INSULIN PEN NEEDLE 32G X 4 MM MISC: 1 refills | Status: DC

## 2023-02-22 MED ORDER — INSULIN PEN NEEDLE 32G X 4 MM MISC
1 refills | Status: DC
Start: 2023-02-22 — End: 2023-05-10

## 2023-02-22 NOTE — Telephone Encounter (Signed)
Received call from pharmacy regarding prescription request.   Per chart review, prescription was printed. Resent electronically to [employer].   Veronda Prude, RN

## 2023-02-22 NOTE — Addendum Note (Signed)
Addended by: Veronda Prude on: 02/22/2023 09:14 AM   Modules accepted: Orders

## 2023-03-01 ENCOUNTER — Other Ambulatory Visit: Payer: Self-pay | Admitting: Cardiology

## 2023-03-02 ENCOUNTER — Ambulatory Visit (HOSPITAL_BASED_OUTPATIENT_CLINIC_OR_DEPARTMENT_OTHER): Admission: RE | Admit: 2023-03-02 | Payer: Medicare HMO | Source: Ambulatory Visit

## 2023-03-03 ENCOUNTER — Ambulatory Visit (HOSPITAL_COMMUNITY): Payer: Medicare HMO

## 2023-03-04 ENCOUNTER — Telehealth: Payer: Self-pay | Admitting: Student

## 2023-03-04 NOTE — Telephone Encounter (Signed)
Contacted Kyle [name] to schedule their annual wellness visit. Appointment made for 03/05/2023.  Thank you,  [facility] Support [facility] Direct dial  [phone]

## 2023-03-05 ENCOUNTER — Ambulatory Visit (INDEPENDENT_AMBULATORY_CARE_PROVIDER_SITE_OTHER): Payer: Medicare HMO

## 2023-03-05 ENCOUNTER — Telehealth: Payer: Self-pay

## 2023-03-05 VITALS — Ht 71.0 in | Wt 165.0 lb

## 2023-03-05 DIAGNOSIS — Z0000 Encounter for general adult medical examination without abnormal findings: Secondary | ICD-10-CM

## 2023-03-05 NOTE — Telephone Encounter (Signed)
Patient presents to clinic for medication samples. 2 boxes of Novolog and pen needles labeled and in medication fridge from 02/09/23. Verified with [facility] sample information and provided patient with medication.   Veronda Prude, RN

## 2023-03-05 NOTE — Patient Instructions (Signed)
Kyle [name] , Thank you for taking time to come for your Medicare Wellness Visit. I appreciate your ongoing commitment to your health goals. Please review the following plan we discussed and let me know if I can assist you in the future.   These are the goals we discussed:  Goals      Remain active and independent        This is a list of the screening recommended for you and due dates:  Health Maintenance  Topic Date Due   Eye exam for diabetics  Never done   Yearly kidney health urinalysis for diabetes  Never done   DTaP/Tdap/Td vaccine (1 - Tdap) Never done   Zoster (Shingles) Vaccine (1 of 2) Never done   Colon Cancer Screening  Never done   COVID-19 Vaccine (5 - 2023-24 season) 06/26/2022   Hemoglobin A1C  03/05/2023   Flu Shot  05/27/2023   Complete foot exam   07/04/2023   Screening for Lung Cancer  11/26/2023   Yearly kidney function blood test for diabetes  02/03/2024   Medicare Annual Wellness Visit  03/04/2024   Pneumonia Vaccine  Completed   Hepatitis C Screening: USPSTF Recommendation to screen - Ages 18-79 yo.  Completed   HPV Vaccine  Aged Out    Advanced directives: Information on Advanced Care Planning can be found at [facility] [place] Advance Health Care Directives Advance Health Care Directives ([email or web address]    Conditions/risks identified: Aim for 30 minutes of exercise or brisk walking, 6-8 glasses of water, and 5 servings of fruits and vegetables each day.   Next appointment: Follow up in one year for your annual wellness visit.   Preventive Care 5 Years and Older, Male  Preventive care refers to lifestyle choices and visits with your health care provider that can promote health and wellness. What does preventive care include? A yearly physical exam. This is also called an annual well check. Dental exams once or twice a year. Routine eye exams. Ask your health care provider how often you should have your eyes checked. Personal lifestyle  choices, including: Daily care of your teeth and gums. Regular physical activity. Eating a healthy diet. Avoiding tobacco and drug use. Limiting alcohol use. Practicing safe sex. Taking low doses of aspirin every day. Taking vitamin and mineral supplements as recommended by your health care provider. What happens during an annual well check? The services and screenings done by your health care provider during your annual well check will depend on your age, overall health, lifestyle risk factors, and family history of disease. Counseling  Your health care provider may ask you questions about your: Alcohol use. Tobacco use. Drug use. Emotional well-being. Home and relationship well-being. Sexual activity. Eating habits. History of falls. Memory and ability to understand (cognition). Work and work Astronomer. Screening  You may have the following tests or measurements: Height, weight, and BMI. Blood pressure. Lipid and cholesterol levels. These may be checked every 5 years, or more frequently if you are over 24 years old. Skin check. Lung cancer screening. You may have this screening every year starting at age 28 if you have a 30-pack-year history of smoking and currently smoke or have quit within the past 15 years. Fecal occult blood test (FOBT) of the stool. You may have this test every year starting at age 59. Flexible sigmoidoscopy or colonoscopy. You may have a sigmoidoscopy every 5 years or a colonoscopy every 10 years starting at age 17. Prostate cancer  screening. Recommendations will vary depending on your family history and other risks. Hepatitis C blood test. Hepatitis B blood test. Sexually transmitted disease (STD) testing. Diabetes screening. This is done by checking your blood sugar (glucose) after you have not eaten for a while (fasting). You may have this done every 1-3 years. Abdominal aortic aneurysm (AAA) screening. You may need this if you are a current or  former smoker. Osteoporosis. You may be screened starting at age 75 if you are at high risk. Talk with your health care provider about your test results, treatment options, and if necessary, the need for more tests. Vaccines  Your health care provider may recommend certain vaccines, such as: Influenza vaccine. This is recommended every year. Tetanus, diphtheria, and acellular pertussis (Tdap, Td) vaccine. You may need a Td booster every 10 years. Zoster vaccine. You may need this after age 48. Pneumococcal 13-valent conjugate (PCV13) vaccine. One dose is recommended after age 23. Pneumococcal polysaccharide (PPSV23) vaccine. One dose is recommended after age 31. Talk to your health care provider about which screenings and vaccines you need and how often you need them. This information is not intended to replace advice given to you by your health care provider. Make sure you discuss any questions you have with your health care provider. Document Released: 11/08/2015 Document Revised: 07/01/2016 Document Reviewed: 08/13/2015 Elsevier Interactive Patient Education  2017 [employer].  Fall Prevention in the Home Falls can cause injuries. They can happen to people of all ages. There are many things you can do to make your home safe and to help prevent falls. What can I do on the outside of my home? Regularly fix the edges of walkways and driveways and fix any cracks. Remove anything that might make you trip as you walk through a door, such as a raised step or threshold. Trim any bushes or trees on the path to your home. Use bright outdoor lighting. Clear any walking paths of anything that might make someone trip, such as rocks or tools. Regularly check to see if handrails are loose or broken. Make sure that both sides of any steps have handrails. Any raised decks and porches should have guardrails on the edges. Have any leaves, snow, or ice cleared regularly. Use sand or salt on walking paths  during winter. Clean up any spills in your garage right away. This includes oil or grease spills. What can I do in the bathroom? Use night lights. Install grab bars by the toilet and in the tub and shower. Do not use towel bars as grab bars. Use non-skid mats or decals in the tub or shower. If you need to sit down in the shower, use a plastic, non-slip stool. Keep the floor dry. Clean up any water that spills on the floor as soon as it happens. Remove soap buildup in the tub or shower regularly. Attach bath mats securely with double-sided non-slip rug tape. Do not have throw rugs and other things on the floor that can make you trip. What can I do in the bedroom? Use night lights. Make sure that you have a light by your bed that is easy to reach. Do not use any sheets or blankets that are too big for your bed. They should not hang down onto the floor. Have a firm chair that has side arms. You can use this for support while you get dressed. Do not have throw rugs and other things on the floor that can make you trip. What can  I do in the kitchen? Clean up any spills right away. Avoid walking on wet floors. Keep items that you use a lot in easy-to-reach places. If you need to reach something above you, use a strong step stool that has a grab bar. Keep electrical cords out of the way. Do not use floor polish or wax that makes floors slippery. If you must use wax, use non-skid floor wax. Do not have throw rugs and other things on the floor that can make you trip. What can I do with my stairs? Do not leave any items on the stairs. Make sure that there are handrails on both sides of the stairs and use them. Fix handrails that are broken or loose. Make sure that handrails are as long as the stairways. Check any carpeting to make sure that it is firmly attached to the stairs. Fix any carpet that is loose or worn. Avoid having throw rugs at the top or bottom of the stairs. If you do have throw  rugs, attach them to the floor with carpet tape. Make sure that you have a light switch at the top of the stairs and the bottom of the stairs. If you do not have them, ask someone to add them for you. What else can I do to help prevent falls? Wear shoes that: Do not have high heels. Have rubber bottoms. Are comfortable and fit you well. Are closed at the toe. Do not wear sandals. If you use a stepladder: Make sure that it is fully opened. Do not climb a closed stepladder. Make sure that both sides of the stepladder are locked into place. Ask someone to hold it for you, if possible. Clearly mark and make sure that you can see: Any grab bars or handrails. First and last steps. Where the edge of each step is. Use tools that help you move around (mobility aids) if they are needed. These include: Canes. Walkers. Scooters. Crutches. Turn on the lights when you go into a dark area. Replace any light bulbs as soon as they burn out. Set up your furniture so you have a clear path. Avoid moving your furniture around. If any of your floors are uneven, fix them. If there are any pets around you, be aware of where they are. Review your medicines with your doctor. Some medicines can make you feel dizzy. This can increase your chance of falling. Ask your doctor what other things that you can do to help prevent falls. This information is not intended to replace advice given to you by your health care provider. Make sure you discuss any questions you have with your health care provider. Document Released: 08/08/2009 Document Revised: 03/19/2016 Document Reviewed: 11/16/2014 Elsevier Interactive Patient Education  2017 [employer].

## 2023-03-05 NOTE — Telephone Encounter (Signed)
Next novolog shipment processes 05/10/23 (last fill 01/22/23.  Next [place] shipment currently unknown. Last filled for 6 month supply.

## 2023-03-05 NOTE — Progress Notes (Signed)
Subjective:   Kyle [name] is a 72 y.o. male who presents for Medicare Annual/Subsequent preventive examination.  I connected with  Kyle [name] on 03/05/23 by a audio enabled telemedicine application and verified that I am speaking with the correct person using two identifiers.  Patient Location: Home  Provider Location: Home Office  I discussed the limitations of evaluation and management by telemedicine. The patient expressed understanding and agreed to proceed.  Review of Systems     Cardiac Risk Factors include: advanced age (>15men, >14 women);diabetes mellitus;dyslipidemia;male gender;hypertension;smoking/ tobacco exposure     Objective:    Today's Vitals   03/05/23 1507  Weight: 165 lb (74.8 kg)  Height: 5\' 11"  (1.803 m)   Body mass index is 23.01 kg/m.     03/05/2023    3:11 PM 10/14/2022    9:49 AM 10/10/2022    9:17 AM 10/09/2022    2:52 PM 05/30/2021    6:41 AM 05/06/2021    7:31 PM 04/07/2021   10:55 AM  Advanced Directives  Does Patient Have a Medical Advance Directive? No No No No No No No  Would patient like information on creating a medical advance directive? Yes (MAU/Ambulatory/Procedural Areas - Information given) No - Patient declined  No - Patient declined No - Patient declined No - Patient declined No - Patient declined    Current Medications (verified) Outpatient Encounter Medications as of 03/05/2023  Medication Sig   Accu-Chek FastClix Lancets MISC USE TO CHECK GLUCOSE AS INDICATED   albuterol (VENTOLIN HFA) 108 (90 Base) MCG/ACT inhaler Inhale 2 puffs into the lungs every 6 (six) hours as needed for wheezing or shortness of breath.   amLODipine (NORVASC) 5 MG tablet TAKE 1 TABLET BY MOUTH AT BEDTIME   apixaban (ELIQUIS) 5 MG TABS tablet Take 1 tablet (5 mg total) by mouth 2 (two) times daily.   carvedilol (COREG) 6.25 MG tablet TAKE 1 TABLET TWICE DAILY WITH MEALS (NEED MD APPOINTMENT)   Fluticasone-Umeclidin-Vilant (TRELEGY ELLIPTA)  200-62.5-25 MCG/ACT AEPB Inhale 1 puff into the lungs daily.   furosemide (LASIX) 40 MG tablet TAKE 1/2 TABLET EVERY DAY   glucose blood (ACCU-CHEK GUIDE) test strip TEST BLOOD SUGAR TWICE DAILY   guaiFENesin (MUCINEX) 600 MG 12 hr tablet Take 2 tablets (1,200 mg total) by mouth 2 (two) times daily.   hydrALAZINE (APRESOLINE) 25 MG tablet TAKE 1 TABLET THREE TIMES DAILY (NEED APPOINTMENT WITH PCP FOR REFILLS)   insulin degludec (TRESIBA FLEXTOUCH) 100 UNIT/ML FlexTouch Pen Inject 14 Units into the skin daily.   Insulin Pen Needle 32G X 4 MM MISC Use as directed with insulin pen Strength: 32G X 4 MM   isosorbide mononitrate (IMDUR) 30 MG 24 hr tablet Take 1 tablet by mouth once daily   levETIRAcetam (KEPPRA) 500 MG tablet TAKE 1 TABLET EVERY DAY   Multiple Vitamins-Minerals (CENTRUM SILVER 50+MEN) TABS Take 1 tablet by mouth daily with breakfast.   NOVOLOG FLEXPEN 100 UNIT/ML FlexPen INJECT 5 UNITS SUBCUTANEOUSLY THREE TIMES DAILY WITH MEALS   oxyCODONE (ROXICODONE) 5 MG immediate release tablet Take 1 tablet (5 mg total) by mouth every 6 (six) hours as needed for severe pain.   OXYGEN Inhale 4.5 L/min into the lungs continuous.   rosuvastatin (CRESTOR) 10 MG tablet TAKE 1 TABLET AT BEDTIME   No facility-administered encounter medications on file as of 03/05/2023.    Allergies (verified) Patient has no known allergies.   History: Past Medical History:  Diagnosis Date   Aortic insufficiency  MODERATE WITH A BICUSPID AORTIC VAVLE   Arterial occlusive disease    MULTILEVEL   CHF (congestive heart failure) (HCC)    COPD (chronic obstructive pulmonary disease) (HCC)    Dilated cardiomyopathy (HCC)    WITH EJECTION FRACTION DOWN TO 20-25%--WITH CONGESTIVE HEART FAILURE   Edema    LOWER EXTREMETIES   Hyperglycemia 01/2020   Hypertension    Normal coronary arteries 2009   Orthopnea    Peripheral arterial disease (HCC)    Pulmonary hypertension (HCC)    Past Surgical History:   Procedure Laterality Date   ABDOMINAL AORTAGRAM N/A 11/05/2014   Procedure: ABDOMINAL Ronny Flurry;  Surgeon: Chuck Hint, MD;  Location: [facility] CATH LAB;  Service: Cardiovascular;  Laterality: N/A;   ABDOMINAL AORTOGRAM W/LOWER EXTREMITY Bilateral 04/21/2019   Procedure: ABDOMINAL AORTOGRAM W/LOWER EXTREMITY;  Surgeon: Chuck Hint, MD;  Location: [facility] INVASIVE CV LAB;  Service: Cardiovascular;  Laterality: Bilateral;   ABDOMINAL AORTOGRAM W/LOWER EXTREMITY Right 05/30/2021   Procedure: ABDOMINAL AORTOGRAM W/LOWER EXTREMITY;  Surgeon: Chuck Hint, MD;  Location: [facility] INVASIVE CV LAB;  Service: Cardiovascular;  Laterality: Right;   AORTA - BILATERAL FEMORAL ARTERY BYPASS GRAFT N/A 12/18/2014   Procedure: AORTOBIFEMORAL BYPASS GRAFT;  Surgeon: Chuck Hint, MD;  Location: [facility] OR;  Service: Vascular;  Laterality: N/A;   CARDIAC CATHETERIZATION  2009   Nl Cors, EF 20%   COLONOSCOPY     ENDARTERECTOMY FEMORAL Left 12/18/2014   Procedure: ENDARTERECTOMY FEMORAL;  Surgeon: Chuck Hint, MD;  Location: [facility] OR;  Service: Vascular;  Laterality: Left;   FALSE ANEURYSM REPAIR Right 04/24/2019   Procedure: REPAIR OF RIGHT FEMORAL ARTERY PSEUDOANEURYSM;  Surgeon: Larina Earthly, MD;  Location: [facility] OR;  Service: Vascular;  Laterality: Right;   FEMORAL-POPLITEAL BYPASS GRAFT  02/21/2011   right   FEMORAL-TIBIAL BYPASS GRAFT Right 04/24/2019   Procedure: REDO BYPASS GRAFT RIGHT FEMORAL-TIBIAL ARTERY USING LEFT LEG VEIN & HEMASHIELD GOLD 8mm GRAFT;  Surgeon: Larina Earthly, MD;  Location: [facility] OR;  Service: Vascular;  Laterality: Right;   IR FLUORO GUIDE CV LINE RIGHT  12/28/2019   IR REMOVAL TUN CV CATH W/O FL  01/11/2020   IR US GUIDE VASC ACCESS RIGHT  12/28/2019   OTHER SURGICAL HISTORY     POST RIGHT FEMOROPOPLITEAL BYPASS GRAFT   PERIPHERAL VASCULAR CATHETERIZATION  11/05/2014   Procedure: LOWER EXTREMITY ANGIOGRAPHY;  Surgeon: Chuck Hint, MD;  Location: [facility] CATH LAB;   Service: Cardiovascular;;   VEIN HARVEST Left 04/24/2019   Procedure: LEFT LEG SAPHENOUS VEIN HARVEST;  Surgeon: Larina Earthly, MD;  Location: [facility] OR;  Service: Vascular;  Laterality: Left;   Family History  Problem Relation Age of Onset   Diabetes Mother    Hyperlipidemia Sister    Hypertension Sister    Social History   Socioeconomic History   Marital status: Married    Spouse name: Not on file   Number of children: 1   Years of education: Not on file   Highest education level: Not on file  Occupational History   Occupation: retired    Associate Professor: [employer]  Tobacco Use   Smoking status: Former    Packs/day: 1.00    Years: 40.00    Additional pack years: 0.00    Total pack years: 40.00    Types: Cigarettes    Quit date: 10/15/2018    Years since quitting: 4.3    Passive exposure: Never   Smokeless tobacco: Never   Tobacco  comments:    quit 2010 and quit again 2019  Vaping Use   Vaping Use: Never used  Substance and Sexual Activity   Alcohol use: No    Alcohol/week: 0.0 standard drinks of alcohol   Drug use: No   Sexual activity: Not on file  Other Topics Concern   Not on file  Social History Narrative   Not on file   Social Determinants of Health   Financial Resource Strain: Low Risk  (03/05/2023)   Overall Financial Resource Strain (CARDIA)    Difficulty of Paying Living Expenses: Not hard at all  Food Insecurity: No Food Insecurity (03/05/2023)   Hunger Vital Sign    Worried About Running Out of Food in the Last Year: Never true    Ran Out of Food in the Last Year: Never true  Transportation Needs: No Transportation Needs (03/05/2023)   PRAPARE - Administrator, Civil Service (Medical): No    Lack of Transportation (Non-Medical): No  Physical Activity: Sufficiently Active (03/05/2023)   Exercise Vital Sign    Days of Exercise per Week: 5 days    Minutes of Exercise per Session: 30 min  Stress: No Stress Concern Present (03/05/2023)   [employer] of [employer]ational [employer] - Occupational Stress Questionnaire    Feeling of Stress : Not at all  Social Connections: Socially Integrated (03/05/2023)   Social Connection and Isolation Panel [NHANES]    Frequency of Communication with Friends and Family: More than three times a week    Frequency of Social Gatherings with Friends and Family: Three times a week    Attends Religious Services: More than 4 times per year    Active Member of Clubs or Organizations: Yes    Attends Banker Meetings: More than 4 times per year    Marital Status: Married    Tobacco Counseling Counseling given: Not Answered Tobacco comments: quit 2010 and quit again 2019   Clinical Intake:  Pre-visit preparation completed: Yes  Pain : No/denies pain  Diabetes: Yes CBG done?: No Did pt. bring in CBG monitor from home?: No  How often do you need to have someone help you when you read instructions, pamphlets, or other written materials from your doctor or pharmacy?: 1 - Never  Diabetic?Yes   Nutrition Risk Assessment:  Has the patient had any N/V/D within the last 2 months?  No  Does the patient have any non-healing wounds?  No  Has the patient had any unintentional weight loss or weight gain?  No   Diabetes:  Is the patient diabetic?  Yes  If diabetic, was a CBG obtained today?  No  Did the patient bring in their glucometer from home?  No  How often do you monitor your CBG's? daily.   Financial Strains and Diabetes Management:  Are you having any financial strains with the device, your supplies or your medication? No .  Does the patient want to be seen by Chronic Care Management for management of their diabetes?  No  Would the patient like to be referred to a Nutritionist or for Diabetic Management?  No   Diabetic Exams:  Diabetic Eye Exam: Completed records requested  Diabetic Foot Exam: Completed 07/03/22   Interpreter Needed?: No  Information entered by :: Kandis Fantasia LPN   Activities of Daily Living    03/05/2023    3:11 PM  In your present state of health, do you have any difficulty performing the following activities:  Hearing? 0  Vision? 0  Difficulty concentrating or making decisions? 0  Walking or climbing stairs? 0  Dressing or bathing? 0  Doing errands, shopping? 0  Preparing Food and eating ? N  Using the Toilet? N  In the past six months, have you accidently leaked urine? N  Do you have problems with loss of bowel control? N  Managing your Medications? N  Managing your Finances? N  Housekeeping or managing your Housekeeping? N    Patient Care Team: Levin Erp, MD as PCP - General (Family Medicine) [place], Peter M, MD as PCP - Cardiology (Cardiology) Oretha Milch, MD as Consulting Physician (Pulmonary Disease) Conley Rolls, [place] as Referring Physician (Optometry)  Indicate any recent Medical Services you may have received from other than [facility] providers in the past year (date may be approximate).     Assessment:   This is a routine wellness examination for Alexey.  Hearing/Vision screen Hearing Screening - Comments:: Denies hearing difficulties   Vision Screening - Comments:: up to date with routine eye exams with [facility]    Dietary issues and exercise activities discussed: Current Exercise Habits: Home exercise routine, Time (Minutes): 30, Frequency (Times/Week): 5, Weekly Exercise (Minutes/Week): 150, Intensity: Mild   Goals Addressed             This Visit's Progress    Remain active and independent        Depression Screen    03/05/2023    3:12 PM 10/14/2022    9:49 AM 10/09/2022    2:52 PM 09/04/2022   10:53 AM 05/02/2021    3:09 PM  PHQ 2/9 Scores  PHQ - 2 Score 0 0 0 0 0  PHQ- 9 Score  1 3 1  0    Fall Risk    03/05/2023    3:08 PM 10/14/2022    9:49 AM 10/09/2022    2:52 PM 09/04/2022   10:53 AM 05/02/2021    3:09 PM  Fall Risk   Falls in the past year? 0 0 0 0 0  Number falls  in past yr: 0 0 0 0 0  Injury with Fall? 0 0 0 0 0  Risk for fall due to : No Fall Risks      Follow up Falls prevention discussed;Education provided;Falls evaluation completed        FALL RISK PREVENTION PERTAINING TO THE HOME:  Any stairs in or around the home? Yes  If so, are there any without handrails? No  Home free of loose throw rugs in walkways, pet beds, electrical cords, etc? Yes  Adequate lighting in your home to reduce risk of falls? Yes   ASSISTIVE DEVICES UTILIZED TO PREVENT FALLS:  Life alert? No  Use of a cane, walker or w/c? No  Grab bars in the bathroom? Yes  Shower chair or bench in shower? No  Elevated toilet seat or a handicapped toilet? Yes   TIMED UP AND GO:  Was the test performed? No . Telephonic visit   Cognitive Function:        03/05/2023    3:12 PM  6CIT Screen  What Year? 0 points  What month? 0 points  What time? 0 points  Count back from 20 0 points  Months in reverse 0 points  Repeat phrase 0 points  Total Score 0 points    Immunizations Immunization History  Administered Date(s) Administered   Fluad Quad(high Dose 65+) 07/14/2019, 07/23/2020, 09/04/2022   Influenza-Unspecified 01/26/2017, 08/10/2017,  08/21/2019   Moderna Sars-Covid-2 Vaccination 02/10/2020, 03/09/2020, 09/30/2020   PFIZER Comirnaty(Gray Top)Covid-19 Tri-Sucrose Vaccine 05/02/2021   Pneumococcal Conjugate-13 01/20/2018   Pneumococcal Polysaccharide-23 07/14/2019    TDAP status: Due, Education has been provided regarding the importance of this vaccine. Advised may receive this vaccine at local pharmacy or Health Dept. Aware to provide a copy of the vaccination record if obtained from local pharmacy or Health Dept. Verbalized acceptance and understanding.  Flu Vaccine status: Up to date  Pneumococcal vaccine status: Up to date  Covid-19 vaccine status: Information provided on how to obtain vaccines.   Qualifies for Shingles Vaccine? Yes   Zostavax completed No    Shingrix Completed?: No.    Education has been provided regarding the importance of this vaccine. Patient has been advised to call insurance company to determine out of pocket expense if they have not yet received this vaccine. Advised may also receive vaccine at local pharmacy or Health Dept. Verbalized acceptance and understanding.  Screening Tests Health Maintenance  Topic Date Due   OPHTHALMOLOGY EXAM  Never done   Diabetic kidney evaluation - Urine ACR  Never done   DTaP/Tdap/Td (1 - Tdap) Never done   Zoster Vaccines- Shingrix (1 of 2) Never done   COVID-19 Vaccine (5 - 2023-24 season) 06/26/2022   HEMOGLOBIN A1C  03/05/2023   COLONOSCOPY (Pts 45-67yrs Insurance coverage will need to be confirmed)  03/04/2024 (Originally 03/04/1996)   INFLUENZA VACCINE  05/27/2023   FOOT EXAM  07/04/2023   Lung Cancer Screening  11/26/2023   Diabetic kidney evaluation - eGFR measurement  02/03/2024   Medicare Annual Wellness (AWV)  03/04/2024   Pneumonia Vaccine 60+ Years old  Completed   Hepatitis C Screening  Completed   HPV VACCINES  Aged Out    Health Maintenance  Health Maintenance Due  Topic Date Due   OPHTHALMOLOGY EXAM  Never done   Diabetic kidney evaluation - Urine ACR  Never done   DTaP/Tdap/Td (1 - Tdap) Never done   Zoster Vaccines- Shingrix (1 of 2) Never done   COVID-19 Vaccine (5 - 2023-24 season) 06/26/2022   HEMOGLOBIN A1C  03/05/2023   Colorectal cancer screening:  Patient declines   Lung Cancer Screening: (Low Dose CT Chest recommended if Age 64-80 years, 30 pack-year currently smoking OR have quit w/in 15years.) does qualify.   Lung Cancer Screening Referral: last 11/25/2022; scheduled for 04/01/23  Additional Screening:  Hepatitis C Screening: does qualify; Completed 12/25/19  Vision Screening: Recommended annual ophthalmology exams for early detection of glaucoma and other disorders of the eye. Is the patient up to date with their annual eye exam?  Yes  Who is  the provider or what is the name of the office in which the patient attends annual eye exams? [facility]  If pt is not established with a provider, would they like to be referred to a provider to establish care? No .   Dental Screening: Recommended annual dental exams for proper oral hygiene  Community Resource Referral / Chronic Care Management: CRR required this visit?  No   CCM required this visit?  No      Plan:     I have personally reviewed and noted the following in the patient's chart:   Medical and social history Use of alcohol, tobacco or illicit drugs  Current medications and supplements including opioid prescriptions. Patient is not currently taking opioid prescriptions. Functional ability and status Nutritional status Physical activity Advanced directives List of other physicians  Hospitalizations, surgeries, and ER visits in previous 12 months Vitals Screenings to include cognitive, depression, and falls Referrals and appointments  In addition, I have reviewed and discussed with patient certain preventive protocols, quality metrics, and best practice recommendations. A written personalized care plan for preventive services as well as general preventive health recommendations were provided to patient.     Durwin Nora, [place]   1/61/0960   Due to this being a virtual visit, the after visit summary with patients personalized plan was offered to patient via mail or my-chart. Patient would like to access on my-chart  Nurse Notes: No concerns

## 2023-03-09 ENCOUNTER — Other Ambulatory Visit: Payer: Self-pay

## 2023-03-09 NOTE — Telephone Encounter (Signed)
Reached back out to pt's wife regarding med pickup.  Informed that one box of tresiba has been in the fridge since 12/2022. [name] will come to pickup 03/10/23.

## 2023-03-10 MED ORDER — LEVETIRACETAM 500 MG PO TABS: 500.0000 mg | ORAL_TABLET | Freq: Every day | ORAL | 0 refills | Status: DC

## 2023-03-15 ENCOUNTER — Other Ambulatory Visit: Payer: Self-pay | Admitting: *Deleted

## 2023-03-15 ENCOUNTER — Encounter: Payer: Self-pay | Admitting: Nurse Practitioner

## 2023-03-15 DIAGNOSIS — I739 Peripheral vascular disease, unspecified: Secondary | ICD-10-CM

## 2023-03-20 ENCOUNTER — Other Ambulatory Visit: Payer: Self-pay | Admitting: Student

## 2023-03-20 DIAGNOSIS — I10 Essential (primary) hypertension: Secondary | ICD-10-CM

## 2023-03-25 ENCOUNTER — Ambulatory Visit (HOSPITAL_COMMUNITY)
Admission: RE | Admit: 2023-03-25 | Discharge: 2023-03-25 | Disposition: A | Discharge status: Home / Self Care | Payer: Medicare HMO | Source: Ambulatory Visit | Attending: Vascular Surgery | Admitting: Vascular Surgery

## 2023-03-25 ENCOUNTER — Ambulatory Visit (INDEPENDENT_AMBULATORY_CARE_PROVIDER_SITE_OTHER): Payer: Medicare HMO | Admitting: Physician Assistant

## 2023-03-25 ENCOUNTER — Ambulatory Visit (INDEPENDENT_AMBULATORY_CARE_PROVIDER_SITE_OTHER)
Admission: RE | Admit: 2023-03-25 | Discharge: 2023-03-25 | Disposition: A | Discharge status: Home / Self Care | Payer: Medicare HMO | Source: Ambulatory Visit | Attending: Vascular Surgery | Admitting: Vascular Surgery

## 2023-03-25 VITALS — BP 176/78 | HR 69 | Temp 97.7°F | Resp 18 | Ht 71.0 in | Wt 163.0 lb

## 2023-03-25 DIAGNOSIS — I739 Peripheral vascular disease, unspecified: Secondary | ICD-10-CM

## 2023-03-25 LAB — VAS US ABI WITH/WO TBI
Left ABI: 0.42
Right ABI: 0.8

## 2023-03-25 NOTE — Progress Notes (Signed)
Office Note   History of Present Illness   Kyle [name] is a 72 y.o. ([birth year]) male who presents for surveillance of PAD.  He has a history of aortobifemoral bypass graft in 2016.  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  In 2020 he had a right femoral artery pseudoaneurysm repair with right femoral to peroneal artery bypass with greater saphenous vein.  During surveillance in 2022 he was found to have elevated velocities of 437cm/s at the distal anastomosis of his femoral-peroneal bypass.  This was investigated via angiogram but no significant stenosis was found.  It was suggested if his velocities ever significantly increased he may require repeat angiogram versus surgical revision.  At his last follow-up with our office he was having some tingling in his toes and experiencing a cold feeling in his feet at night.  This was thought to be neurological in nature and he was prescribed gabapentin.  He returns today for follow up.  He states that the tingling and cold sensations he was having last year have almost completely gone away.  He does not take his gabapentin.  He denies any claudication, rest pain, nonhealing wounds of the lower extremities.   Current Outpatient Medications  Medication Sig Dispense Refill   Accu-Chek FastClix Lancets MISC USE TO CHECK GLUCOSE AS INDICATED 204 each 3   albuterol (VENTOLIN HFA) 108 (90 Base) MCG/ACT inhaler Inhale 2 puffs into the lungs every 6 (six) hours as needed for wheezing or shortness of breath. 8 g 2   amLODipine (NORVASC) 5 MG tablet TAKE 1 TABLET BY MOUTH AT BEDTIME 30 tablet 0   apixaban (ELIQUIS) 5 MG TABS tablet Take 1 tablet (5 mg total) by mouth 2 (two) times daily. 180 tablet 1   carvedilol (COREG) 6.25 MG tablet TAKE 1 TABLET TWICE DAILY WITH MEALS (NEED MD APPOINTMENT) 180 tablet 3   Fluticasone-Umeclidin-Vilant (TRELEGY ELLIPTA) 200-62.5-25 MCG/ACT AEPB Inhale 1 puff  into the lungs daily. 180 each 3   furosemide (LASIX) 40 MG tablet TAKE 1/2 TABLET EVERY DAY 45 tablet 3   glucose blood (ACCU-CHEK GUIDE) test strip TEST BLOOD SUGAR TWICE DAILY 200 strip 2   guaiFENesin (MUCINEX) 600 MG 12 hr tablet Take 2 tablets (1,200 mg total) by mouth 2 (two) times daily. 30 tablet 0   hydrALAZINE (APRESOLINE) 25 MG tablet TAKE 1 TABLET THREE TIMES DAILY (NEED APPOINTMENT WITH PCP FOR REFILLS) 180 tablet 3   insulin degludec (TRESIBA FLEXTOUCH) 100 UNIT/ML FlexTouch Pen Inject 14 Units into the skin daily.     Insulin Pen Needle 32G X 4 MM MISC Use as directed with insulin pen Strength: 32G X 4 MM 200 each 1   isosorbide mononitrate (IMDUR) 30 MG 24 hr tablet Take 1 tablet by mouth once daily 90 tablet 3   levETIRAcetam (KEPPRA) 500 MG tablet Take 1 tablet (500 mg total) by mouth daily. 60 tablet 0   Multiple Vitamins-Minerals (CENTRUM SILVER 50+MEN) TABS Take 1 tablet by mouth daily with breakfast.     NOVOLOG FLEXPEN 100 UNIT/ML FlexPen INJECT 5 UNITS SUBCUTANEOUSLY THREE TIMES DAILY WITH MEALS 3 mL 0   oxyCODONE (ROXICODONE) 5 MG immediate release tablet Take 1 tablet (5 mg total) by mouth every 6 (six) hours as needed for severe pain. 15 tablet 0   OXYGEN Inhale 4.5 L/min into the lungs continuous.     rosuvastatin (CRESTOR) 10 MG tablet TAKE 1 TABLET AT  BEDTIME 90 tablet 3   No current facility-administered medications for this visit.    REVIEW OF SYSTEMS (negative unless checked):   Cardiac:  []  Chest pain or chest pressure? []  Shortness of breath upon activity? []  Shortness of breath when lying flat? []  Irregular heart rhythm?  Vascular:  []  Pain in calf, thigh, or hip brought on by walking? []  Pain in feet at night that wakes you up from your sleep? []  Blood clot in your veins? []  Leg swelling?  Pulmonary:  []  Oxygen at home? []  Productive cough? []  Wheezing?  Neurologic:  []  Sudden weakness in arms or legs? []  Sudden numbness in arms or  legs? []  Sudden onset of difficult speaking or slurred speech? []  Temporary loss of vision in one eye? []  Problems with dizziness?  Gastrointestinal:  []  Blood in stool? []  Vomited blood?  Genitourinary:  []  Burning when urinating? []  Blood in urine?  Psychiatric:  []  Major depression  Hematologic:  []  Bleeding problems? []  Problems with blood clotting?  Dermatologic:  []  Rashes or ulcers?  Constitutional:  []  Fever or chills?  Ear/Nose/Throat:  []  Change in hearing? []  Nose bleeds? []  Sore throat?  Musculoskeletal:  []  Back pain? []  Joint pain? []  Muscle pain?   Physical Examination   Vitals:   03/25/23 1001  BP: (!) 176/78  Pulse: 69  Resp: 18  Temp: 97.7 F (36.5 C)  TempSrc: Temporal  Weight: 163 lb (73.9 kg)  Height: 5\' 11"  (1.803 m)   Body mass index is 22.73 kg/m.  General:  WDWN in NAD; vital signs documented above Gait: Not observed HENT: WNL, normocephalic Pulmonary: normal non-labored breathing , without rales, rhonchi,  wheezing Cardiac: regular Abdomen: soft, NT, no masses Skin: without rashes Vascular Exam/Pulses: palpable right DP pulse Extremities: without ischemic changes, without gangrene , without cellulitis; without open wounds;  Musculoskeletal: no muscle wasting or atrophy  Neurologic: A&O X 3;  No focal weakness or paresthesias are detected Psychiatric:  The pt has Normal affect.  Non-Invasive Vascular Imaging ABI (03/25/2023) R:  ABI: 0.8 (0.68),  PT: mono DP: bi TBI:  0.60 L:  ABI: 0.42 (0.44),  PT: mono DP: none TBI: 0.44   RLE Bypass Duplex (03/25/2023) Right Graft #1: femoral to peroneal  +------------------+--------+-------------+--------+--------+                   PSV cm/sStenosis     WaveformComments  +------------------+--------+-------------+--------+--------+  Inflow           68                                     +------------------+--------+-------------+--------+--------+  Prox  Anastomosis  78                                     +------------------+--------+-------------+--------+--------+  Proximal Graft    44                                     +------------------+--------+-------------+--------+--------+  Mid Graft         48                                     +------------------+--------+-------------+--------+--------+  Distal Graft      62                                     +------------------+--------+-------------+--------+--------+  Distal Anastomosis436     >70% stenosis                  +------------------+--------+-------------+--------+--------+  Outflow          93                                     +------------------+--------+-------------+--------+--------+     Summary:  Right: Patent bypass graft with continued elevated velocities at the  distal anastomosis >70%.    Medical Decision Making   LABRADFORD SAINZ is a 72 y.o. male who presents for surveillance of PAD  Based on the patient's vascular studies and examination, his ABIs on the right have increased slightly from 0.68 to 0.8.  His ABIs on the left are essentially unchanged at 0.42 Duplex of his right femoral to peroneal artery bypass demonstrates a patent bypass with continued elevated velocities at the distal anastomosis. PSV at 436, which is unchanged from his velocities in 2022.  These have not increased and therefore he does not require further investigation at this time He denies any claudication, rest pain, nonhealing wounds of the lower extremities.  He has a palpable right DP pulse He can follow-up with our office in 9 months with ABIs and right lower extremity bypass graft duplex   Loel Dubonnet PA-C [facility] Office: [phone]  Clinic MD: Edilia Bo

## 2023-03-30 ENCOUNTER — Other Ambulatory Visit: Payer: Self-pay

## 2023-03-30 DIAGNOSIS — I739 Peripheral vascular disease, unspecified: Secondary | ICD-10-CM

## 2023-04-01 ENCOUNTER — Ambulatory Visit (HOSPITAL_BASED_OUTPATIENT_CLINIC_OR_DEPARTMENT_OTHER)
Admission: RE | Admit: 2023-04-01 | Discharge: 2023-04-01 | Disposition: A | Discharge status: Home / Self Care | Payer: Medicare HMO | Source: Ambulatory Visit | Attending: Acute Care | Admitting: Acute Care

## 2023-04-01 ENCOUNTER — Encounter (HOSPITAL_BASED_OUTPATIENT_CLINIC_OR_DEPARTMENT_OTHER): Payer: Self-pay

## 2023-04-01 DIAGNOSIS — J439 Emphysema, unspecified: Secondary | ICD-10-CM | POA: Diagnosis not present

## 2023-04-01 DIAGNOSIS — R918 Other nonspecific abnormal finding of lung field: Secondary | ICD-10-CM | POA: Diagnosis not present

## 2023-04-01 DIAGNOSIS — Z87891 Personal history of nicotine dependence: Secondary | ICD-10-CM | POA: Insufficient documentation

## 2023-04-01 DIAGNOSIS — R911 Solitary pulmonary nodule: Secondary | ICD-10-CM | POA: Insufficient documentation

## 2023-04-05 ENCOUNTER — Ambulatory Visit (HOSPITAL_COMMUNITY): Payer: Medicare HMO | Attending: Internal Medicine

## 2023-04-05 DIAGNOSIS — I5022 Chronic systolic (congestive) heart failure: Secondary | ICD-10-CM | POA: Insufficient documentation

## 2023-04-05 DIAGNOSIS — E782 Mixed hyperlipidemia: Secondary | ICD-10-CM | POA: Insufficient documentation

## 2023-04-05 DIAGNOSIS — I482 Chronic atrial fibrillation, unspecified: Secondary | ICD-10-CM | POA: Insufficient documentation

## 2023-04-05 DIAGNOSIS — I739 Peripheral vascular disease, unspecified: Secondary | ICD-10-CM | POA: Insufficient documentation

## 2023-04-05 DIAGNOSIS — N1831 Chronic kidney disease, stage 3a: Secondary | ICD-10-CM | POA: Diagnosis not present

## 2023-04-05 LAB — ECHOCARDIOGRAM COMPLETE
AR max vel: 0.89 cm2
AV Area VTI: 0.89 cm2
AV Area mean vel: 0.82 cm2
AV Mean grad: 33 mmHg
AV Peak grad: 54.2 mmHg
Ao pk vel: 3.68 m/s
Area-P 1/2: 5.79 cm2
P 1/2 time: 470 msec
S' Lateral: 2.4 cm

## 2023-04-20 ENCOUNTER — Other Ambulatory Visit (HOSPITAL_COMMUNITY): Payer: Self-pay

## 2023-04-21 ENCOUNTER — Other Ambulatory Visit: Payer: Self-pay | Admitting: Student

## 2023-04-24 ENCOUNTER — Other Ambulatory Visit: Payer: Self-pay | Admitting: Student

## 2023-04-24 DIAGNOSIS — I10 Essential (primary) hypertension: Secondary | ICD-10-CM

## 2023-04-28 DIAGNOSIS — E119 Type 2 diabetes mellitus without complications: Secondary | ICD-10-CM | POA: Diagnosis not present

## 2023-05-10 ENCOUNTER — Other Ambulatory Visit: Payer: Self-pay | Admitting: Student

## 2023-05-10 DIAGNOSIS — Z794 Long term (current) use of insulin: Secondary | ICD-10-CM

## 2023-05-14 ENCOUNTER — Telehealth: Payer: Self-pay | Admitting: Acute Care

## 2023-05-14 DIAGNOSIS — Z87891 Personal history of nicotine dependence: Secondary | ICD-10-CM

## 2023-05-14 DIAGNOSIS — R911 Solitary pulmonary nodule: Secondary | ICD-10-CM

## 2023-05-14 NOTE — Telephone Encounter (Signed)
I have called the patient 's wife [place] with the results of his low-dose screening CT.  She is listed as his DPR.  This was a 67-month follow-up of a previously read lung RADS 4A which had multiple new pulmonary nodules. Scan done in June 2024 which was a 42-month follow-up to the previous scan shows stability of the previously noted nodules, the nodule in the left upper lobe had actually shrunk from 7.8 mm to 6 mm. Plan will be for a 46-month follow-up low-dose screening CT which will be due in December 2024 to continue to monitor these pulmonary nodules. Patient wife verbalized understanding of the above had no further questions at completion of the call.  Sherre Lain, and [place], please place order for 55-month follow-up low-dose screening CT.  Please fax results to PCP and let them know plan is for a 14-month follow-up.

## 2023-05-17 NOTE — Telephone Encounter (Signed)
Results/plans faxed to PCP. Order placed for 6 mth nodule f/u CT.

## 2023-05-20 ENCOUNTER — Telehealth: Payer: Self-pay

## 2023-05-20 DIAGNOSIS — E119 Type 2 diabetes mellitus without complications: Secondary | ICD-10-CM

## 2023-05-20 NOTE — Telephone Encounter (Signed)
Left message informing wife that Mr. Powells novo nordisk shipment is ready for pickup  2 boxes of Novolog flexpens (+ pen needles) are labeled and ready for pickup in med room fridge.

## 2023-05-24 NOTE — Telephone Encounter (Signed)
Patient's wife presents to clinic for Novolog shipment.   Provided with medications per note from [place].   Wife is asking about [place] assistance. Double checked medication fridge and there was no labeled [place] for patient.   Will forward to [place] for further advisement.   Veronda Prude, RN

## 2023-05-25 ENCOUNTER — Encounter: Payer: Self-pay | Admitting: Nurse Practitioner

## 2023-05-26 ENCOUNTER — Ambulatory Visit: Payer: Medicare HMO | Admitting: Nurse Practitioner

## 2023-05-28 NOTE — Telephone Encounter (Signed)
Last shipment of Tresiba 04/05/23 was rec'd with thawed ice packs. Shipment wouldve only been good until 05/12/23. This was patients last refill & we did not give to him to be safe.  Reached out to novo nordisk again regarding how to go about refills if medication was never dispensed. Rep let me know I could send a letter of replacement by 06/01/23 letting them know the situation and request the order again.   Will fax signed letter with office heading asap.

## 2023-05-29 ENCOUNTER — Other Ambulatory Visit: Payer: Self-pay | Admitting: Student

## 2023-05-29 DIAGNOSIS — I10 Essential (primary) hypertension: Secondary | ICD-10-CM

## 2023-06-09 ENCOUNTER — Other Ambulatory Visit (HOSPITAL_COMMUNITY): Payer: Self-pay

## 2023-06-09 NOTE — Telephone Encounter (Signed)
Tresiba refill approved by [facility] nord[facility] 06/03/23. Medication should arrive at office in 10-14 business days.

## 2023-06-10 NOTE — Telephone Encounter (Signed)
Reached out to [facility] to see if they could fill [place] for just one 3ml pen. Pharmacy is able to do this.  Can one 3ml pen be sent over for patient to fill while waiting for novo nordisk shipment. This should last him 3 weeks.

## 2023-06-12 MED ORDER — INSULIN DEGLUDEC 100 UNIT/ML ~~LOC~~ SOLN
100.0000 [IU] | Freq: Every day | SUBCUTANEOUS | 0 refills | Status: DC
Start: 2023-06-12 — End: 2023-06-15

## 2023-06-12 NOTE — Addendum Note (Signed)
Addended by: Darnelle Spangle B on: 06/12/2023 12:36 AM   Modules accepted: Orders

## 2023-06-15 ENCOUNTER — Other Ambulatory Visit: Payer: Self-pay | Admitting: Student

## 2023-06-15 ENCOUNTER — Telehealth: Payer: Self-pay

## 2023-06-15 MED ORDER — TRESIBA FLEXTOUCH 100 UNIT/ML ~~LOC~~ SOPN: 14.0000 [IU] | PEN_INJECTOR | Freq: Every day | SUBCUTANEOUS | 1 refills | Status: DC

## 2023-06-15 NOTE — Telephone Encounter (Signed)
Returned call to patient and pharmacy.  Medication sent for 100 units daily, patient is taking 14 units daily. Routed to pcp and Dr. Marisue Humble for clarification and new rx if appropriate.   Rx will also need to specify PENS.

## 2023-06-15 NOTE — Telephone Encounter (Signed)
Pt wife is requesting a cal back .Marland Kitchen She stated it is about his insulin.Marland Kitchen

## 2023-06-16 ENCOUNTER — Telehealth: Payer: Self-pay

## 2023-06-16 NOTE — Telephone Encounter (Signed)
Left message informing patient his novo nordisk shipment is ready for pickup.  2 boxes of insulin degludec are ready in med room fridge.

## 2023-06-17 NOTE — Telephone Encounter (Signed)
Patient presents to nurse clinic for shipment.   Provided per note from [place].   Kyle Prude, RN

## 2023-06-29 ENCOUNTER — Other Ambulatory Visit: Payer: Self-pay | Admitting: Student

## 2023-06-29 DIAGNOSIS — I10 Essential (primary) hypertension: Secondary | ICD-10-CM

## 2023-07-02 ENCOUNTER — Other Ambulatory Visit: Payer: Self-pay | Admitting: Cardiology

## 2023-07-02 DIAGNOSIS — I482 Chronic atrial fibrillation, unspecified: Secondary | ICD-10-CM

## 2023-07-02 NOTE — Telephone Encounter (Signed)
Prescription refill request for Eliquis received. Indication: Afib  Last office visit: 02/03/23 ([place])  Scr: 1.69 (02/03/23)  Age: 72 Weight: 73.9kg  Appropriate dose. Refill sent.

## 2023-07-03 ENCOUNTER — Other Ambulatory Visit: Payer: Self-pay | Admitting: Student

## 2023-07-26 ENCOUNTER — Other Ambulatory Visit: Payer: Self-pay | Admitting: Student

## 2023-07-26 ENCOUNTER — Other Ambulatory Visit: Payer: Self-pay

## 2023-07-26 MED ORDER — ISOSORBIDE MONONITRATE ER 30 MG PO TB24: ORAL_TABLET | ORAL | 1 refills | Status: DC

## 2023-08-05 ENCOUNTER — Other Ambulatory Visit: Payer: Self-pay | Admitting: Student

## 2023-08-05 DIAGNOSIS — I10 Essential (primary) hypertension: Secondary | ICD-10-CM

## 2023-08-10 ENCOUNTER — Other Ambulatory Visit: Payer: Self-pay

## 2023-08-10 DIAGNOSIS — I10 Essential (primary) hypertension: Secondary | ICD-10-CM

## 2023-08-10 MED ORDER — AMLODIPINE BESYLATE 5 MG PO TABS
5.0000 mg | ORAL_TABLET | Freq: Every day | ORAL | 0 refills | Status: DC
Start: 2023-08-10 — End: 2023-09-10

## 2023-08-10 NOTE — Telephone Encounter (Signed)
Received VM from [facility] requesting 90 day supply of amlodipine.   Called wife as PCP sent prescription to [facility] on 08/05/23.  Wife states that patient was completely out so they needed it filled at local pharmacy. She is requesting normal 90 day supply to be sent to [facility].   Advised that patient needs BP follow up visit. Scheduled for Thursday, 10/24.  Pended medication to this encounter.  Veronda Prude, RN

## 2023-08-12 ENCOUNTER — Ambulatory Visit: Payer: Medicare HMO | Admitting: Nurse Practitioner

## 2023-08-14 ENCOUNTER — Other Ambulatory Visit: Payer: Self-pay | Admitting: Student

## 2023-08-19 ENCOUNTER — Ambulatory Visit: Payer: Medicare HMO | Admitting: Student

## 2023-08-19 ENCOUNTER — Other Ambulatory Visit: Payer: Self-pay | Admitting: Student

## 2023-08-19 NOTE — Progress Notes (Deleted)
    SUBJECTIVE:   CHIEF COMPLAINT / HPI:   Diabetic Follow Up: Patient is a 72 y.o. male who present today for diabetic follow up.   Patient endorses {rwdmsmartlistproblems:24882}  Home medications include: amlodipine 5 mg, coreg 6.25 BID ACEi/ARB: {yes/no/default n/a:21102::"not applicable"} Statin: {yes/no/default n/a:21102::"not applicable"} Patient endorses taking these medications as prescribed.***  Most recent A1Cs:  Lab Results  Component Value Date   HGBA1C 6.0 09/04/2022   HGBA1C 6.2 (H) 02/12/2022   HGBA1C 7.0 (H) 04/07/2021   Last Microalbumin, LDL, Creatinine: Lab Results  Component Value Date   LDLCALC 71 02/03/2023   CREATININE 1.69 (H) 02/03/2023   Patient {rwdoesdoesnot:24881} check blood glucose on a regular basis.  Patient {rwisisnot:24883} up to date on diabetic eye. Patient {rwisisnot:24883} up to date on diabetic foot exam.  Hypertension: Patient is a 73 y.o. male who present today for follow up of hypertension.   Patient endorses {rwdmsmartlistproblems:24882}  Home medications include: *** Patient endorses taking these medications as prescribed.*** Denies any headache, vision changes, shortness of breath, lower extremity swelling or chest pain   Most recent creatinine trend:  Lab Results  Component Value Date   CREATININE 1.69 (H) 02/03/2023   CREATININE 1.88 (H) 10/14/2022   CREATININE 1.62 (H) 10/10/2022   Patient {rwdoesdoesnot:24881} check blood pressure at home.  Patient {HAS HAS ZHY:86578} had a BMP in the past 1 year.  PERTINENT  PMH / PSH: afib, bicuspid aortic valve, PAD, PAH, COPD  OBJECTIVE:   There were no vitals taken for this visit.  ***  ASSESSMENT/PLAN:   No problem-specific Assessment & Plan notes found for this encounter.     Levin Erp, MD [facility]

## 2023-08-23 ENCOUNTER — Other Ambulatory Visit: Payer: Self-pay

## 2023-08-23 DIAGNOSIS — I10 Essential (primary) hypertension: Secondary | ICD-10-CM

## 2023-08-30 ENCOUNTER — Other Ambulatory Visit: Payer: Self-pay | Admitting: Vascular Surgery

## 2023-09-03 LAB — HM DIABETES EYE EXAM

## 2023-09-06 ENCOUNTER — Emergency Department (HOSPITAL_COMMUNITY): Payer: Medicare HMO

## 2023-09-06 ENCOUNTER — Inpatient Hospital Stay (HOSPITAL_COMMUNITY): Payer: Medicare HMO

## 2023-09-06 ENCOUNTER — Other Ambulatory Visit: Payer: Self-pay

## 2023-09-06 ENCOUNTER — Inpatient Hospital Stay (HOSPITAL_COMMUNITY)
Admission: EM | Admit: 2023-09-06 | Discharge: 2023-09-10 | DRG: 064 | Disposition: A | Discharge status: Rehabilitation Facility | Payer: Medicare HMO | Attending: Family Medicine | Admitting: Family Medicine

## 2023-09-06 ENCOUNTER — Encounter (HOSPITAL_COMMUNITY): Payer: Self-pay | Admitting: Neurology

## 2023-09-06 DIAGNOSIS — I700 Atherosclerosis of aorta: Secondary | ICD-10-CM | POA: Diagnosis not present

## 2023-09-06 DIAGNOSIS — I618 Other nontraumatic intracerebral hemorrhage: Secondary | ICD-10-CM | POA: Diagnosis present

## 2023-09-06 DIAGNOSIS — K5901 Slow transit constipation: Secondary | ICD-10-CM | POA: Diagnosis not present

## 2023-09-06 DIAGNOSIS — Z8249 Family history of ischemic heart disease and other diseases of the circulatory system: Secondary | ICD-10-CM

## 2023-09-06 DIAGNOSIS — R29810 Facial weakness: Secondary | ICD-10-CM | POA: Diagnosis present

## 2023-09-06 DIAGNOSIS — I10 Essential (primary) hypertension: Secondary | ICD-10-CM | POA: Diagnosis not present

## 2023-09-06 DIAGNOSIS — Z9981 Dependence on supplemental oxygen: Secondary | ICD-10-CM | POA: Diagnosis not present

## 2023-09-06 DIAGNOSIS — Z794 Long term (current) use of insulin: Secondary | ICD-10-CM | POA: Diagnosis not present

## 2023-09-06 DIAGNOSIS — Z7901 Long term (current) use of anticoagulants: Secondary | ICD-10-CM | POA: Diagnosis not present

## 2023-09-06 DIAGNOSIS — K5900 Constipation, unspecified: Secondary | ICD-10-CM | POA: Diagnosis present

## 2023-09-06 DIAGNOSIS — I69122 Dysarthria following nontraumatic intracerebral hemorrhage: Secondary | ICD-10-CM | POA: Diagnosis not present

## 2023-09-06 DIAGNOSIS — I5022 Chronic systolic (congestive) heart failure: Secondary | ICD-10-CM | POA: Diagnosis not present

## 2023-09-06 DIAGNOSIS — J9610 Chronic respiratory failure, unspecified whether with hypoxia or hypercapnia: Secondary | ICD-10-CM | POA: Diagnosis present

## 2023-09-06 DIAGNOSIS — I714 Abdominal aortic aneurysm, without rupture, unspecified: Secondary | ICD-10-CM | POA: Diagnosis not present

## 2023-09-06 DIAGNOSIS — I130 Hypertensive heart and chronic kidney disease with heart failure and stage 1 through stage 4 chronic kidney disease, or unspecified chronic kidney disease: Secondary | ICD-10-CM | POA: Diagnosis present

## 2023-09-06 DIAGNOSIS — I729 Aneurysm of unspecified site: Secondary | ICD-10-CM | POA: Insufficient documentation

## 2023-09-06 DIAGNOSIS — I4891 Unspecified atrial fibrillation: Secondary | ICD-10-CM | POA: Diagnosis not present

## 2023-09-06 DIAGNOSIS — I728 Aneurysm of other specified arteries: Secondary | ICD-10-CM | POA: Diagnosis present

## 2023-09-06 DIAGNOSIS — I6389 Other cerebral infarction: Secondary | ICD-10-CM

## 2023-09-06 DIAGNOSIS — R4189 Other symptoms and signs involving cognitive functions and awareness: Secondary | ICD-10-CM | POA: Diagnosis present

## 2023-09-06 DIAGNOSIS — R29711 NIHSS score 11: Secondary | ICD-10-CM | POA: Diagnosis present

## 2023-09-06 DIAGNOSIS — I619 Nontraumatic intracerebral hemorrhage, unspecified: Principal | ICD-10-CM | POA: Diagnosis present

## 2023-09-06 DIAGNOSIS — R1310 Dysphagia, unspecified: Secondary | ICD-10-CM | POA: Diagnosis not present

## 2023-09-06 DIAGNOSIS — I6523 Occlusion and stenosis of bilateral carotid arteries: Secondary | ICD-10-CM | POA: Diagnosis not present

## 2023-09-06 DIAGNOSIS — I639 Cerebral infarction, unspecified: Secondary | ICD-10-CM | POA: Diagnosis not present

## 2023-09-06 DIAGNOSIS — E785 Hyperlipidemia, unspecified: Secondary | ICD-10-CM | POA: Diagnosis present

## 2023-09-06 DIAGNOSIS — N1832 Chronic kidney disease, stage 3b: Secondary | ICD-10-CM | POA: Diagnosis not present

## 2023-09-06 DIAGNOSIS — J4489 Other specified chronic obstructive pulmonary disease: Secondary | ICD-10-CM | POA: Diagnosis present

## 2023-09-06 DIAGNOSIS — I272 Pulmonary hypertension, unspecified: Secondary | ICD-10-CM | POA: Diagnosis present

## 2023-09-06 DIAGNOSIS — G936 Cerebral edema: Secondary | ICD-10-CM | POA: Diagnosis not present

## 2023-09-06 DIAGNOSIS — E1122 Type 2 diabetes mellitus with diabetic chronic kidney disease: Secondary | ICD-10-CM | POA: Diagnosis present

## 2023-09-06 DIAGNOSIS — I610 Nontraumatic intracerebral hemorrhage in hemisphere, subcortical: Principal | ICD-10-CM | POA: Diagnosis present

## 2023-09-06 DIAGNOSIS — I672 Cerebral atherosclerosis: Secondary | ICD-10-CM | POA: Diagnosis not present

## 2023-09-06 DIAGNOSIS — R531 Weakness: Secondary | ICD-10-CM | POA: Diagnosis not present

## 2023-09-06 DIAGNOSIS — I480 Paroxysmal atrial fibrillation: Secondary | ICD-10-CM | POA: Diagnosis present

## 2023-09-06 DIAGNOSIS — R471 Dysarthria and anarthria: Secondary | ICD-10-CM | POA: Diagnosis present

## 2023-09-06 DIAGNOSIS — R208 Other disturbances of skin sensation: Secondary | ICD-10-CM | POA: Diagnosis not present

## 2023-09-06 DIAGNOSIS — K5730 Diverticulosis of large intestine without perforation or abscess without bleeding: Secondary | ICD-10-CM | POA: Diagnosis not present

## 2023-09-06 DIAGNOSIS — R0989 Other specified symptoms and signs involving the circulatory and respiratory systems: Secondary | ICD-10-CM | POA: Diagnosis not present

## 2023-09-06 DIAGNOSIS — R29818 Other symptoms and signs involving the nervous system: Secondary | ICD-10-CM | POA: Diagnosis not present

## 2023-09-06 DIAGNOSIS — Z7951 Long term (current) use of inhaled steroids: Secondary | ICD-10-CM

## 2023-09-06 DIAGNOSIS — J449 Chronic obstructive pulmonary disease, unspecified: Secondary | ICD-10-CM | POA: Diagnosis present

## 2023-09-06 DIAGNOSIS — E46 Unspecified protein-calorie malnutrition: Secondary | ICD-10-CM | POA: Diagnosis not present

## 2023-09-06 DIAGNOSIS — J439 Emphysema, unspecified: Secondary | ICD-10-CM | POA: Diagnosis present

## 2023-09-06 DIAGNOSIS — F54 Psychological and behavioral factors associated with disorders or diseases classified elsewhere: Secondary | ICD-10-CM | POA: Diagnosis not present

## 2023-09-06 DIAGNOSIS — G8191 Hemiplegia, unspecified affecting right dominant side: Secondary | ICD-10-CM | POA: Diagnosis not present

## 2023-09-06 DIAGNOSIS — Z87891 Personal history of nicotine dependence: Secondary | ICD-10-CM

## 2023-09-06 DIAGNOSIS — J410 Simple chronic bronchitis: Secondary | ICD-10-CM | POA: Diagnosis not present

## 2023-09-06 DIAGNOSIS — I701 Atherosclerosis of renal artery: Secondary | ICD-10-CM | POA: Diagnosis not present

## 2023-09-06 DIAGNOSIS — I517 Cardiomegaly: Secondary | ICD-10-CM | POA: Diagnosis not present

## 2023-09-06 DIAGNOSIS — M792 Neuralgia and neuritis, unspecified: Secondary | ICD-10-CM | POA: Diagnosis not present

## 2023-09-06 DIAGNOSIS — R0902 Hypoxemia: Secondary | ICD-10-CM | POA: Diagnosis not present

## 2023-09-06 DIAGNOSIS — I420 Dilated cardiomyopathy: Secondary | ICD-10-CM | POA: Diagnosis present

## 2023-09-06 DIAGNOSIS — R590 Localized enlarged lymph nodes: Secondary | ICD-10-CM | POA: Diagnosis not present

## 2023-09-06 DIAGNOSIS — Z833 Family history of diabetes mellitus: Secondary | ICD-10-CM

## 2023-09-06 DIAGNOSIS — I482 Chronic atrial fibrillation, unspecified: Secondary | ICD-10-CM | POA: Diagnosis not present

## 2023-09-06 DIAGNOSIS — N179 Acute kidney failure, unspecified: Secondary | ICD-10-CM | POA: Diagnosis not present

## 2023-09-06 DIAGNOSIS — E119 Type 2 diabetes mellitus without complications: Secondary | ICD-10-CM | POA: Diagnosis not present

## 2023-09-06 DIAGNOSIS — Z8349 Family history of other endocrine, nutritional and metabolic diseases: Secondary | ICD-10-CM

## 2023-09-06 DIAGNOSIS — R4781 Slurred speech: Secondary | ICD-10-CM | POA: Diagnosis present

## 2023-09-06 DIAGNOSIS — G40909 Epilepsy, unspecified, not intractable, without status epilepticus: Secondary | ICD-10-CM | POA: Diagnosis not present

## 2023-09-06 DIAGNOSIS — I69151 Hemiplegia and hemiparesis following nontraumatic intracerebral hemorrhage affecting right dominant side: Secondary | ICD-10-CM | POA: Diagnosis not present

## 2023-09-06 DIAGNOSIS — I724 Aneurysm of artery of lower extremity: Secondary | ICD-10-CM | POA: Diagnosis not present

## 2023-09-06 DIAGNOSIS — Z79899 Other long term (current) drug therapy: Secondary | ICD-10-CM

## 2023-09-06 DIAGNOSIS — I6381 Other cerebral infarction due to occlusion or stenosis of small artery: Secondary | ICD-10-CM | POA: Diagnosis not present

## 2023-09-06 DIAGNOSIS — Q2381 Bicuspid aortic valve: Secondary | ICD-10-CM

## 2023-09-06 DIAGNOSIS — E1151 Type 2 diabetes mellitus with diabetic peripheral angiopathy without gangrene: Secondary | ICD-10-CM | POA: Diagnosis present

## 2023-09-06 DIAGNOSIS — R0602 Shortness of breath: Secondary | ICD-10-CM | POA: Diagnosis not present

## 2023-09-06 HISTORY — DX: Unspecified atrial fibrillation: I48.91

## 2023-09-06 HISTORY — DX: Nontraumatic intracerebral hemorrhage, unspecified: I61.9

## 2023-09-06 HISTORY — DX: Type 2 diabetes mellitus without complications: E11.9

## 2023-09-06 LAB — RAPID URINE DRUG SCREEN, HOSP PERFORMED
Amphetamines: NOT DETECTED
Barbiturates: NOT DETECTED
Benzodiazepines: NOT DETECTED
Cocaine: NOT DETECTED
Opiates: NOT DETECTED
Tetrahydrocannabinol: NOT DETECTED

## 2023-09-06 LAB — LIPID PANEL
Cholesterol: 123 mg/dL (ref 0–200)
HDL: 40 mg/dL — ABNORMAL LOW (ref >40)
LDL Cholesterol: 61 mg/dL (ref 0–99)
Total CHOL/HDL Ratio: 3.1 {ratio}
Triglycerides: 111 mg/dL (ref <150)
VLDL: 22 mg/dL (ref 0–40)

## 2023-09-06 LAB — COMPREHENSIVE METABOLIC PANEL
ALT: 14 U/L (ref 0–44)
AST: 26 U/L (ref 15–41)
Albumin: 3.3 g/dL — ABNORMAL LOW (ref 3.5–5.0)
Alkaline Phosphatase: 51 U/L (ref 38–126)
Anion gap: 10 (ref 5–15)
BUN: 20 mg/dL (ref 8–23)
CO2: 22 mmol/L (ref 22–32)
Calcium: 9.2 mg/dL (ref 8.9–10.3)
Chloride: 107 mmol/L (ref 98–111)
Creatinine, Ser: 1.77 mg/dL — ABNORMAL HIGH (ref 0.61–1.24)
GFR, Estimated: 41 mL/min — ABNORMAL LOW (ref >60)
Glucose, Bld: 114 mg/dL — ABNORMAL HIGH (ref 70–99)
Potassium: 4.1 mmol/L (ref 3.5–5.1)
Sodium: 139 mmol/L (ref 135–145)
Total Bilirubin: 0.4 mg/dL (ref <1.2)
Total Protein: 6.5 g/dL (ref 6.5–8.1)

## 2023-09-06 LAB — I-STAT CHEM 8, ED
BUN: 26 mg/dL — ABNORMAL HIGH (ref 8–23)
Calcium, Ion: 1.1 mmol/L — ABNORMAL LOW (ref 1.15–1.40)
Chloride: 108 mmol/L (ref 98–111)
Creatinine, Ser: 1.8 mg/dL — ABNORMAL HIGH (ref 0.61–1.24)
Glucose, Bld: 114 mg/dL — ABNORMAL HIGH (ref 70–99)
HCT: 45 % (ref 39.0–52.0)
Hemoglobin: 15.3 g/dL (ref 13.0–17.0)
Potassium: 4.2 mmol/L (ref 3.5–5.1)
Sodium: 144 mmol/L (ref 135–145)
TCO2: 26 mmol/L (ref 22–32)

## 2023-09-06 LAB — ECHOCARDIOGRAM COMPLETE
AR max vel: 0.98 cm2
AV Area VTI: 1.03 cm2
AV Area mean vel: 0.96 cm2
AV Mean grad: 36 mm[Hg]
AV Peak grad: 59.7 mm[Hg]
Ao pk vel: 3.86 m/s
Area-P 1/2: 3.99 cm2
Height: 71 in
P 1/2 time: 509 ms
S' Lateral: 2.7 cm
Weight: 2663.16 [oz_av]

## 2023-09-06 LAB — CBC
HCT: 44.5 % (ref 39.0–52.0)
Hemoglobin: 14.2 g/dL (ref 13.0–17.0)
MCH: 30.2 pg (ref 26.0–34.0)
MCHC: 31.9 g/dL (ref 30.0–36.0)
MCV: 94.7 fL (ref 80.0–100.0)
Platelets: 159 10*3/uL (ref 150–400)
RBC: 4.7 MIL/uL (ref 4.22–5.81)
RDW: 15.2 % (ref 11.5–15.5)
WBC: 7.4 10*3/uL (ref 4.0–10.5)
nRBC: 0 % (ref 0.0–0.2)

## 2023-09-06 LAB — URINALYSIS, ROUTINE W REFLEX MICROSCOPIC
Bacteria, UA: NONE SEEN
Bilirubin Urine: NEGATIVE
Glucose, UA: NEGATIVE mg/dL
Hgb urine dipstick: NEGATIVE
Ketones, ur: NEGATIVE mg/dL
Leukocytes,Ua: NEGATIVE
Nitrite: NEGATIVE
Protein, ur: 30 mg/dL — AB
Specific Gravity, Urine: 1.021 (ref 1.005–1.030)
pH: 7 (ref 5.0–8.0)

## 2023-09-06 LAB — PROTIME-INR
INR: 1.2 (ref 0.8–1.2)
Prothrombin Time: 15.3 s — ABNORMAL HIGH (ref 11.4–15.2)

## 2023-09-06 LAB — DIFFERENTIAL
Abs Immature Granulocytes: 0.02 10*3/uL (ref 0.00–0.07)
Basophils Absolute: 0 10*3/uL (ref 0.0–0.1)
Basophils Relative: 0 %
Eosinophils Absolute: 0.1 10*3/uL (ref 0.0–0.5)
Eosinophils Relative: 1 %
Immature Granulocytes: 0 %
Lymphocytes Relative: 24 %
Lymphs Abs: 1.8 10*3/uL (ref 0.7–4.0)
Monocytes Absolute: 0.9 10*3/uL (ref 0.1–1.0)
Monocytes Relative: 12 %
Neutro Abs: 4.6 10*3/uL (ref 1.7–7.7)
Neutrophils Relative %: 63 %

## 2023-09-06 LAB — MRSA NEXT GEN BY PCR, NASAL: MRSA by PCR Next Gen: NOT DETECTED

## 2023-09-06 LAB — APTT: aPTT: 38 s — ABNORMAL HIGH (ref 24–36)

## 2023-09-06 LAB — HEMOGLOBIN A1C
Hgb A1c MFr Bld: 6.2 % — ABNORMAL HIGH (ref 4.8–5.6)
Mean Plasma Glucose: 131.24 mg/dL

## 2023-09-06 LAB — ETHANOL: Alcohol, Ethyl (B): <10 mg/dL (ref <10)

## 2023-09-06 MED ORDER — CARVEDILOL 3.125 MG PO TABS
6.2500 mg | ORAL_TABLET | Freq: Two times a day (BID) | ORAL | Status: DC
  Administered 2023-09-06 – 2023-09-07 (×3): 6.25 mg via ORAL
  Filled 2023-09-06 (×4): qty 2

## 2023-09-06 MED ORDER — FUROSEMIDE 20 MG PO TABS
20.0000 mg | ORAL_TABLET | Freq: Every day | ORAL | Status: DC
  Administered 2023-09-06 – 2023-09-10 (×5): 20 mg via ORAL
  Filled 2023-09-06 (×5): qty 1

## 2023-09-06 MED ORDER — ACETAMINOPHEN 160 MG/5ML PO SOLN: 650.0000 mg | ORAL | Status: DC | PRN

## 2023-09-06 MED ORDER — ORAL CARE MOUTH RINSE
15.0000 mL | OROMUCOSAL | Status: DC | PRN
  Administered 2023-09-06: 15 mL via OROMUCOSAL

## 2023-09-06 MED ORDER — SENNOSIDES-DOCUSATE SODIUM 8.6-50 MG PO TABS
1.0000 | ORAL_TABLET | Freq: Two times a day (BID) | ORAL | Status: DC
  Administered 2023-09-06 – 2023-09-10 (×8): 1 via ORAL
  Filled 2023-09-06 (×8): qty 1

## 2023-09-06 MED ORDER — HYDRALAZINE HCL 25 MG PO TABS
25.0000 mg | ORAL_TABLET | Freq: Three times a day (TID) | ORAL | Status: DC
  Administered 2023-09-06 (×2): 25 mg via ORAL
  Filled 2023-09-06 (×2): qty 1

## 2023-09-06 MED ORDER — SODIUM CHLORIDE 0.9 % IV SOLN: INTRAVENOUS | Status: AC | PRN

## 2023-09-06 MED ORDER — ROSUVASTATIN CALCIUM 5 MG PO TABS
10.0000 mg | ORAL_TABLET | Freq: Every day | ORAL | Status: DC
  Administered 2023-09-06 – 2023-09-09 (×4): 10 mg via ORAL
  Filled 2023-09-06 (×4): qty 2

## 2023-09-06 MED ORDER — STROKE: EARLY STAGES OF RECOVERY BOOK
Freq: Once | Status: AC
  Filled 2023-09-06: qty 1

## 2023-09-06 MED ORDER — AMLODIPINE BESYLATE 5 MG PO TABS
5.0000 mg | ORAL_TABLET | Freq: Every day | ORAL | Status: DC
  Administered 2023-09-06: 5 mg via ORAL
  Filled 2023-09-06: qty 1

## 2023-09-06 MED ORDER — CLEVIDIPINE BUTYRATE 0.5 MG/ML IV EMUL
0.0000 mg/h | INTRAVENOUS | Status: DC
  Administered 2023-09-06: 32 mg/h via INTRAVENOUS
  Administered 2023-09-06: 26 mg/h via INTRAVENOUS
  Administered 2023-09-06: 2 mg/h via INTRAVENOUS
  Administered 2023-09-06: 25 mg/h via INTRAVENOUS
  Administered 2023-09-06: 32 mg/h via INTRAVENOUS
  Administered 2023-09-06 (×2): 30 mg/h via INTRAVENOUS
  Administered 2023-09-06 (×3): 21 mg/h via INTRAVENOUS
  Administered 2023-09-06: 26 mg/h via INTRAVENOUS
  Administered 2023-09-07 (×2): 30 mg/h via INTRAVENOUS
  Administered 2023-09-07 (×4): 28 mg/h via INTRAVENOUS
  Administered 2023-09-07: 8 mg/h via INTRAVENOUS
  Administered 2023-09-07: 26 mg/h via INTRAVENOUS
  Administered 2023-09-07: 9 mg/h via INTRAVENOUS
  Administered 2023-09-07: 32 mg/h via INTRAVENOUS
  Administered 2023-09-07: 14 mg/h via INTRAVENOUS
  Filled 2023-09-06 (×2): qty 50
  Filled 2023-09-06 (×4): qty 100
  Filled 2023-09-06: qty 50
  Filled 2023-09-06: qty 100
  Filled 2023-09-06 (×2): qty 50
  Filled 2023-09-06: qty 100
  Filled 2023-09-06: qty 200
  Filled 2023-09-06: qty 100
  Filled 2023-09-06: qty 200
  Filled 2023-09-06: qty 50
  Filled 2023-09-06: qty 100

## 2023-09-06 MED ORDER — ISOSORBIDE MONONITRATE ER 30 MG PO TB24
30.0000 mg | ORAL_TABLET | Freq: Every day | ORAL | Status: DC
  Administered 2023-09-06 – 2023-09-10 (×5): 30 mg via ORAL
  Filled 2023-09-06 (×5): qty 1

## 2023-09-06 MED ORDER — ACETAMINOPHEN 650 MG RE SUPP: 650.0000 mg | RECTAL | Status: DC | PRN

## 2023-09-06 MED ORDER — LEVETIRACETAM IN NACL 500 MG/100ML IV SOLN
500.0000 mg | Freq: Two times a day (BID) | INTRAVENOUS | Status: DC
  Administered 2023-09-06 (×2): 500 mg via INTRAVENOUS
  Filled 2023-09-06 (×3): qty 100

## 2023-09-06 MED ORDER — CHLORHEXIDINE GLUCONATE CLOTH 2 % EX PADS
6.0000 | MEDICATED_PAD | Freq: Every day | CUTANEOUS | Status: DC
  Administered 2023-09-06 – 2023-09-09 (×4): 6 via TOPICAL

## 2023-09-06 MED ORDER — PANTOPRAZOLE SODIUM 40 MG IV SOLR: 40.0000 mg | Freq: Every day | INTRAVENOUS | Status: DC

## 2023-09-06 MED ORDER — LABETALOL HCL 5 MG/ML IV SOLN
20.0000 mg | INTRAVENOUS | Status: DC | PRN
  Administered 2023-09-06 – 2023-09-08 (×6): 20 mg via INTRAVENOUS
  Filled 2023-09-06 (×6): qty 4

## 2023-09-06 MED ORDER — PANTOPRAZOLE SODIUM 40 MG PO TBEC
40.0000 mg | DELAYED_RELEASE_TABLET | Freq: Every day | ORAL | Status: DC
  Administered 2023-09-06 – 2023-09-10 (×5): 40 mg via ORAL
  Filled 2023-09-06 (×5): qty 1

## 2023-09-06 MED ORDER — CLEVIDIPINE BUTYRATE 0.5 MG/ML IV EMUL
INTRAVENOUS | Status: AC
  Filled 2023-09-06: qty 50

## 2023-09-06 MED ORDER — IOHEXOL 350 MG/ML SOLN
100.0000 mL | Freq: Once | INTRAVENOUS | Status: AC | PRN
  Administered 2023-09-06: 100 mL via INTRAVENOUS

## 2023-09-06 MED ORDER — IOHEXOL 350 MG/ML SOLN
75.0000 mL | Freq: Once | INTRAVENOUS | Status: AC | PRN
  Administered 2023-09-06: 75 mL via INTRAVENOUS

## 2023-09-06 MED ORDER — HYDRALAZINE HCL 20 MG/ML IJ SOLN
20.0000 mg | INTRAMUSCULAR | Status: DC | PRN
  Administered 2023-09-08: 20 mg via INTRAVENOUS
  Filled 2023-09-06: qty 1

## 2023-09-06 MED ORDER — ALBUTEROL SULFATE (2.5 MG/3ML) 0.083% IN NEBU: 3.0000 mL | INHALATION_SOLUTION | Freq: Four times a day (QID) | RESPIRATORY_TRACT | Status: DC | PRN

## 2023-09-06 MED ORDER — PROTHROMBIN COMPLEX CONC HUMAN 500 UNITS IV KIT
2042.0000 [IU] | PACK | Status: AC
  Administered 2023-09-06: 2042 [IU] via INTRAVENOUS
  Filled 2023-09-06: qty 2042

## 2023-09-06 MED ORDER — HYDRALAZINE HCL 20 MG/ML IJ SOLN
20.0000 mg | Freq: Once | INTRAMUSCULAR | Status: AC
  Administered 2023-09-06: 20 mg via INTRAVENOUS

## 2023-09-06 MED ORDER — AMLODIPINE BESYLATE 10 MG PO TABS
10.0000 mg | ORAL_TABLET | Freq: Every day | ORAL | Status: DC
  Administered 2023-09-07 – 2023-09-10 (×4): 10 mg via ORAL
  Filled 2023-09-06 (×4): qty 1

## 2023-09-06 MED ORDER — ACETAMINOPHEN 325 MG PO TABS: 650.0000 mg | ORAL_TABLET | ORAL | Status: DC | PRN

## 2023-09-06 NOTE — Progress Notes (Signed)
     [address]       [place] [address]             [phone]       Imiages and chart reviewed.  No obvious dissection or intramural hematoma.  Pt also presents with a hemorrhagic stroke  No indication for intervention from a thoracic surgery standpoint  Katieann Hungate Keane Scrape

## 2023-09-06 NOTE — Progress Notes (Signed)
  Echocardiogram 2D Echocardiogram has been performed.  Kyle [name] 09/06/2023, 2:44 PM

## 2023-09-06 NOTE — ED Notes (Signed)
Pt to MRI and then 4N17.

## 2023-09-06 NOTE — Evaluation (Signed)
Speech Language Pathology Evaluation Patient Details Name: Kyle [name] MRN: [id number] DOB: [birth year] Today's Date: 09/06/2023 Time: 4098-1191 SLP Time Calculation (min) (ACUTE ONLY): 32 min  Problem List:  Patient Active Problem List   Diagnosis Date Noted   ICH (intracerebral hemorrhage) (HCC) 09/06/2023   Hand pain, right 10/12/2022   Chronic respiratory failure with hypoxia (HCC) 01/29/2022   Atrial flutter (HCC)    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HCC) 02/20/2020   Pulmonary nodules/lesions, multiple 01/29/2020   Anticoagulant long-term use 01/29/2020   SOB (shortness of breath)    Atrial fibrillation, chronic (HCC) 12/24/2019   Diabetes mellitus type 2, insulin dependent (HCC) 12/24/2019   Healthcare maintenance 07/14/2019   Medication management 07/14/2019   PAD (peripheral artery disease) (HCC) 04/22/2019   Atherosclerosis of artery of extremity with rest pain (HCC) 04/21/2019   Deformity of metatarsal bone of right foot 04/17/2019   COPD with chronic bronchitis and emphysema (HCC) 12/16/2018   Elevated PSA 12/27/2017   Protein-calorie malnutrition, severe (HCC) 12/18/2014   Aortoiliac occlusive disease (HCC) 12/16/2014   PVD (peripheral vascular disease) (HCC) 10/24/2014   Aortic stenosis 09/12/2014   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   Pulmonary hypertension (HCC) 09/12/2014   Dyslipidemia 09/12/2014   Atherosclerosis of native arteries of extremity with intermittent claudication (HCC) 05/11/2012   Aortic insufficiency    Hypertension    Peripheral arterial disease (HCC)    Dilated cardiomyopathy (HCC)    Chronic systolic CHF (congestive heart failure) (HCC)    Past Medical History:  Past Medical History:  Diagnosis Date   Aortic insufficiency    MODERATE WITH A BICUSPID AORTIC VAVLE   Arterial occlusive disease    MULTILEVEL   CHF (congestive heart failure) (HCC)    Chronic bronchitis (HCC)    CKD (chronic kidney disease) stage 3, GFR  30-59 ml/min (HCC)    COPD (chronic obstructive pulmonary disease) (HCC)    Dilated cardiomyopathy (HCC)    WITH EJECTION FRACTION DOWN TO 20-25%--WITH CONGESTIVE HEART FAILURE   Edema    LOWER EXTREMETIES   Emphysema of lung (HCC)    Hyperglycemia 01/2020   Hypertension    Normal coronary arteries 2009   Orthopnea    Peripheral arterial disease (HCC)    Pulmonary hypertension (HCC)    Past Surgical History:  Past Surgical History:  Procedure Laterality Date   ABDOMINAL AORTAGRAM N/A 11/05/2014   Procedure: ABDOMINAL Ronny Flurry;  Surgeon: Chuck Hint, MD;  Location[facility] CATH LAB;  Service: Cardiovascular;  Laterality: N/A;   ABDOMINAL AORTOGRAM W/LOWER EXTREMITY Bilateral 04/21/2019   Procedure: ABDOMINAL AORTOGRAM W/LOWER EXTREMITY;  Surgeon: Chuck Hint, MD;  Location: [facility] INVASIVE CV LAB;  Service: Cardiovascular;  Laterality: Bilateral;   ABDOMINAL AORTOGRAM W/LOWER EXTREMITY Right 05/30/2021   Procedure: ABDOMINAL AORTOGRAM W/LOWER EXTREMITY;  Surgeon: Chuck Hint, MD;  Location: [facility] INVASIVE CV LAB;  Service: Cardiovascular;  Laterality: Right;   AORTA - BILATERAL FEMORAL ARTERY BYPASS GRAFT N/A 12/18/2014   Procedure: AORTOBIFEMORAL BYPASS GRAFT;  Surgeon: Chuck Hint, MD;  Location: [facility] OR;  Service: Vascular;  Laterality: N/A;   CARDIAC CATHETERIZATION  2009   Nl Cors, EF 20%   COLONOSCOPY     ENDARTERECTOMY FEMORAL Left 12/18/2014   Procedure: ENDARTERECTOMY FEMORAL;  Surgeon: Chuck Hint, MD;  Location[facility] OR;  Service: Vascular;  Laterality: Left;   FALSE ANEURYSM REPAIR Right 04/24/2019   Procedure: REPAIR OF RIGHT FEMORAL ARTERY PSEUDOANEURYSM;  Surgeon: Larina Earthly, MD;  Location: [facility]  OR;  Service: Vascular;  Laterality: Right;   FEMORAL-POPLITEAL BYPASS GRAFT  02/21/2011   right   FEMORAL-TIBIAL BYPASS GRAFT Right 04/24/2019   Procedure: REDO BYPASS GRAFT RIGHT FEMORAL-TIBIAL ARTERY USING LEFT LEG VEIN & HEMASHIELD GOLD 8mm  GRAFT;  Surgeon: Larina Earthly, MD;  Location: [facility] OR;  Service: Vascular;  Laterality: Right;   IR FLUORO GUIDE CV LINE RIGHT  12/28/2019   IR REMOVAL TUN CV CATH W/O FL  01/11/2020   IR US GUIDE VASC ACCESS RIGHT  12/28/2019   OTHER SURGICAL HISTORY     POST RIGHT FEMOROPOPLITEAL BYPASS GRAFT   PERIPHERAL VASCULAR CATHETERIZATION  11/05/2014   Procedure: LOWER EXTREMITY ANGIOGRAPHY;  Surgeon: Chuck Hint, MD;  Location: [facility] CATH LAB;  Service: Cardiovascular;;   VEIN HARVEST Left 04/24/2019   Procedure: LEFT LEG SAPHENOUS VEIN HARVEST;  Surgeon: Larina Earthly, MD;  Location: [facility] OR;  Service: Vascular;  Laterality: Left;   HPI:  Kyle [name] is a 72 y.o. male with hx of Dilated cardiomyopathy, COPD on oxygen at baseline, PAD, Afibb on eliquis and took his last dose last night who went to sleep at 1900 and woke up and fell on the floor as soon as he tried to get up. Wife called EMS. Noted to have elevated BP to 210 systolic. He was brought in as a code stroke for concern for potential ICH.  MRI Brain revealed "Unchanged acute hemorrhage centered within the left lentiform nuclei  with mild surrounding edema and mild mass effect on the left lateral  ventricle.".   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 Pt's running speech observed to be <10% intelligible. His intelligibility improves to 70-75% when speaking at the word or short phrase level. Some instances of questionable oral groping was observed, which raises the suspicion for an apraxic component to motor speech impairments; however, further assessment is needed. Possible higher level expressive language deficits noted. Pt completed automatic sequences and naming tasks with 100% accuracy. Challenged with repetition task at the sentence level. No significant word finding  difficulty noted in conversation, though further assessment is indicated. Receptive language judged to be [facility]. Pt able to answer Y/N questions and follow multi-step commands without difficulty. He denied concerns for comprehension at this time. Pt is determined for speech to improve.   Recommend continue SLP intervention to address speech and dysphagia goals. Pt may benefit from higher level expressive/receptive language assessment.     SLP Assessment  SLP Recommendation/Assessment: Patient needs continued Speech Lanaguage Pathology Services SLP Visit Diagnosis: Dysphagia, unspecified (R13.10);Dysarthria and anarthria (R47.1)    Recommendations for follow up therapy are one component of a multi-disciplinary discharge planning process, led by the attending physician.  Recommendations may be updated based on patient status, additional functional criteria and insurance authorization.    Follow Up Recommendations  Acute inpatient rehab (3hours/day)    Assistance Recommended at Discharge  Frequent or constant Supervision/Assistance  Functional Status Assessment Patient has had a recent decline in their functional status and demonstrates the ability to make significant improvements in function in a reasonable and predictable amount of time.  Frequency and Duration min 1 x/week  1 week      SLP Evaluation Cognition  Overall Cognitive Status: Impaired/Different from baseline Arousal/Alertness: Awake/alert Orientation Level: Oriented to person;Oriented to place;Oriented to situation       Comprehension  Auditory Comprehension Overall Auditory Comprehension: Appears within functional limits for tasks assessed Yes/No Questions: Within Functional Limits Commands: Within Functional Limits Conversation: Simple Visual Recognition/Discrimination Discrimination: Not tested Reading Comprehension Reading Status: Not tested    Expression Expression Primary Mode of Expression: Verbal Verbal  Expression Overall Verbal Expression: Impaired Initiation: No impairment Automatic Speech: Name;Counting;Day of week Level of Generative/Spontaneous Verbalization: Conversation Repetition: Impaired Level of Impairment: Sentence level Naming: No impairment Pragmatics: No impairment Non-Verbal Means of Communication: Not applicable Written Expression Dominant Hand: Right Written Expression: Not tested   Oral / Motor  Oral Motor/Sensory Function Overall Oral Motor/Sensory Function: Moderate impairment Facial ROM: Reduced right Facial Symmetry: Abnormal symmetry right Facial Strength: Reduced right Lingual ROM: Within Functional Limits Lingual Symmetry: Within Functional Limits Lingual Strength: Within Functional Limits Velum: Within Functional Limits Mandible: Within Functional Limits            Ellery Plunk 09/06/2023, 3:20 PM

## 2023-09-06 NOTE — ED Notes (Signed)
To CT

## 2023-09-06 NOTE — TOC CM/SW Note (Signed)
Transition of Care [facility]) - Inpatient Brief Assessment   Patient Details  Name: Kyle [name] MRN: [id number] Date of Birth: 30-May-1951  [facility] of [facility]) CM/SW Contact:    Mearl Latin, LCSW Phone Number: 09/06/2023, 4:57 PM   Clinical Narrative: Patient admitted from home with spouse undergoing ICH workup. No current TOC needs identified at this time but please place consult as needs arise.    Transition of Care Asessment: Insurance and Status: Insurance coverage has been reviewed Patient has primary care physician: Yes Home environment has been reviewed: From home Prior level of function:: Independent Prior/Current Home Services: No current home services Social Determinants of Health Reivew: SDOH reviewed no interventions necessary Readmission risk has been reviewed: Yes Transition of care needs: no transition of care needs at this time

## 2023-09-06 NOTE — ED Triage Notes (Signed)
Pt to ED via [facility] from home. Pt was unable to get up this am and right side would not move. LKW 1900 last night  EMS VS  212/104 HR 58 Cbg 129

## 2023-09-06 NOTE — ED Notes (Signed)
Wife, [phone]

## 2023-09-06 NOTE — Progress Notes (Addendum)
STROKE TEAM PROGRESS NOTE   BRIEF HPI [name] is a 72 y.o. male with history of dilated cardiomyopathy, chronic respiratory failure on 4.5L baseline, hx of fem pop bypass in 2010, an aortobifemoral bypass in 2016 presenting with right sided weakness. CT showed a left thalamic hemorrhage. He was given Kcentra for eliquis reversal in the ED. CT surgery consulted and recommend BP control less than 140 for aortic management. Also found to have a large right pseudoaneurysm and VVS has been consulted. Plan for intervention during this hospitalization.    SIGNIFICANT HOSPITAL EVENTS 11/10- admit to ICU due to [facility] 11/11- VVS consult for pseudoaneurysm   INTERIM HISTORY/SUBJECTIVE VVS consulted, will need pseudoaneurysm fixed this admission but will need to be anticoagulated Increase O2 demand. CXR ordered. Not taking PO medications yet Cleviprex for BP control   OBJECTIVE  CBC    Component Value Date/Time   WBC 7.4 09/06/2023 0514   RBC 4.70 09/06/2023 0514   HGB 15.3 09/06/2023 0516   HGB 14.8 02/12/2022 0943   HCT 45.0 09/06/2023 0516   HCT 45.0 02/12/2022 0943   PLT 159 09/06/2023 0514   PLT 174 02/12/2022 0943   MCV 94.7 09/06/2023 0514   MCV 95 02/12/2022 0943   MCH 30.2 09/06/2023 0514   MCHC 31.9 09/06/2023 0514   RDW 15.2 09/06/2023 0514   RDW 15.0 02/12/2022 0943   LYMPHSABS 1.8 09/06/2023 0514   LYMPHSABS 1.5 02/12/2022 0943   MONOABS 0.9 09/06/2023 0514   EOSABS 0.1 09/06/2023 0514   EOSABS 0.1 02/12/2022 0943   BASOSABS 0.0 09/06/2023 0514   BASOSABS 0.0 02/12/2022 0943    BMET    Component Value Date/Time   NA 144 09/06/2023 0516   NA 145 (H) 02/03/2023 0927   K 4.2 09/06/2023 0516   CL 108 09/06/2023 0516   CO2 22 09/06/2023 0514   GLUCOSE 114 (H) 09/06/2023 0516   BUN 26 (H) 09/06/2023 0516   BUN 22 02/03/2023 0927   CREATININE 1.80 (H) 09/06/2023 0516   CREATININE 0.99 11/12/2014 1344   CALCIUM 9.2 09/06/2023 0514   EGFR 43 (L) 02/03/2023  0927   GFRNONAA 41 (L) 09/06/2023 0514    IMAGING past 24 hours CT Angio Chest/Abd/Pel for Dissection W and/or Wo Contrast  Result Date: 09/06/2023 CLINICAL DATA:  72 year old male with signs and symptoms concerning for possible acute aortic syndrome. EXAM: CT ANGIOGRAPHY CHEST, ABDOMEN AND PELVIS TECHNIQUE: Non-contrast CT of the chest was initially obtained. Multidetector CT imaging through the chest, abdomen and pelvis was performed using the standard protocol during bolus administration of intravenous contrast. Multiplanar reconstructed images and MIPs were obtained and reviewed to evaluate the vascular anatomy. RADIATION DOSE REDUCTION: This exam was performed according to the departmental dose-optimization program which includes automated exposure control, adjustment of the mA and/or kV according to patient size and/or use of iterative reconstruction technique. CONTRAST:  OMNIPAQUE IOHEXOL 350 MG/ML SOLN COMPARISON:  No priors. FINDINGS: CTA CHEST FINDINGS Cardiovascular: Precontrast images demonstrate no crescentic high attenuation associated with the wall of the thoracic aorta to indicate the presence of acute intramural hemorrhage. Postcontrast images demonstrate extensive atherosclerosis of the thoracic aorta, without evidence of aneurysm or dissection (study is substantially limited by extensive pulsation artifact). There are multiple penetrating ulcers in the thoracic aorta, most notably along the medial aspect of the thoracic aortic arch (axial image 37 of series 6). Multiple ulcerated plaques are also noted throughout the thoracic aorta. Atherosclerotic calcifications are also noted in  the left main, left anterior descending and left circumflex coronary arteries. Severe thickening and calcification of the aortic valve is also noted. Heart size is mildly enlarged with concentric left ventricular hypertrophy. There is no significant pericardial fluid, thickening or pericardial  calcification. Mediastinum/Nodes: There are multiple prominent borderline enlarged mediastinal and hilar lymph nodes, nonspecific. Esophagus is unremarkable in appearance. No axillary lymphadenopathy. Lungs/Pleura: In the inferior aspect of the right lower lobe (axial image 124 of series 7) there is a 5 mm pulmonary nodule. No other larger more suspicious appearing pulmonary nodules or masses are noted. No acute consolidative airspace disease. No pleural effusions. Diffuse bronchial wall thickening with moderate to severe centrilobular and paraseptal emphysema. Musculoskeletal: There are no aggressive appearing lytic or blastic lesions noted in the visualized portions of the skeleton. Review of the MIP images confirms the above findings. CTA ABDOMEN AND PELVIS FINDINGS VASCULAR Aorta: Extensive atherosclerosis throughout the abdominal aorta with multiple ulcerated plaques. 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 Patient is status post aorto bi femoral bypass graft, which is widely patent. 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 Celiac: Patent without evidence of aneurysm, dissection, vasculitis or significant stenosis. SMA: Patent without evidence of aneurysm, dissection, vasculitis or significant stenosis. Renals: Left renal artery is widely patent. Severe high-grade stenosis just beyond the ostium of the right renal artery, best appreciated on coronal image 81 of series 9. IMA: Occluded at the ostium, but reconstituted distally, presumably from collateral pathways. Inflow: Native common iliac and external iliac arteries appear completely occluded on the right. On the left, the native common iliac artery is completely occluded with the left external iliac artery nearly completely occluded (some distal flow). Aortobifemoral bypass graft is widely patent  bilaterally. 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 Veins: No obvious venous abnormality within the limitations of this arterial phase study. Review of the MIP images confirms the above findings. NON-VASCULAR Hepatobiliary: Liver has a slightly shrunken appearance and nodular contour, suggesting a background of cirrhosis. No definite suspicious cystic or solid hepatic lesions. No intra or extrahepatic biliary ductal dilatation. Gallbladder is unremarkable in appearance. Pancreas: No pancreatic mass. No pancreatic ductal dilatation. No pancreatic or peripancreatic fluid collections or inflammatory changes. Spleen: Unremarkable. Adrenals/Urinary Tract: Moderate right renal atrophy. No suspicious renal lesions. No hydroureteronephrosis. Urinary bladder is unremarkable in appearance. Bilateral adrenal glands are unremarkable in appearance. Stomach/Bowel: The appearance of the stomach is normal. There is no pathologic dilatation of small bowel or colon. Numerous colonic diverticuli are noted, without surrounding inflammatory changes to suggest an acute diverticulitis at this time. Normal appendix. Lymphatic: No lymphadenopathy noted in the abdomen or pelvis. Reproductive: Prostate gland and seminal vesicles are unremarkable in appearance. Other: No significant volume of ascites.  No pneumoperitoneum. Musculoskeletal: There are no aggressive appearing lytic or blastic lesions noted in the visualized portions of the skeleton. Review of the MIP images confirms the above findings. IMPRESSION: 1. Extensive aortic atherosclerosis with multiple penetrating ulcers and ulcerated  plaques. 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 2. Patient is status post aorto bi-iliac bypass graft. Both limbs of the graft are widely patent. 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 Consultation with vascular surgery is strongly recommended for further clinical evaluation and management. 3. 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 4. Aortic atherosclerosis, in addition to left main and 2 vessel coronary artery disease. 5. There are calcifications of the aortic valve. Echocardiographic, as above. Correlation for evaluation of potential valvular dysfunction may be warranted if clinically indicated. 6. Cardiomegaly with concentric left ventricular hypertrophy. 7. Colonic diverticulosis without evidence of acute diverticulitis at this time. 8. 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 9. Additional incidental findings Electronically Signed   By: Trudie Reed M.D.   On: 09/06/2023 06:50   CT ANGIO HEAD NECK W WO CM (CODE STROKE)  Result Date: 09/06/2023 CLINICAL DATA:  72 year old male code stroke presentation with right side weakness and left lentiform acute hemorrhage. EXAM: CT ANGIOGRAPHY HEAD AND NECK WITH AND WITHOUT CONTRAST TECHNIQUE: Multidetector CT imaging of the head and neck was performed using the standard protocol during bolus administration of intravenous contrast. Multiplanar CT image reconstructions and MIPs were obtained to evaluate the vascular anatomy. Carotid stenosis measurements (when applicable) are obtained utilizing NASCET criteria, using the distal internal carotid diameter as the denominator. RADIATION DOSE REDUCTION: This exam was performed according to the departmental dose-optimization  program which includes automated exposure control, adjustment of the mA and/or kV according to patient size and/or use of iterative reconstruction technique. COMPARISON:  Head CT today 0519 hours. FINDINGS: CTA NECK Skeleton: Absent dentition. Multilevel cervical spine disc and endplate degeneration. No acute osseous abnormality identified. Upper chest: At least moderate bilateral emphysema. Negative visible superior mediastinum except for highly abnormal aorta. Other neck: No acute finding. Aortic arch: Severe aortic arch atherosclerosis with multifocal ulcerated soft plaque, appearance suspicious for developing intramural hematoma/dissection. See series 7, image 171 and series 8, image 93. Underlying calcified plaque. Three vessel arch configuration. Right carotid system: Brachiocephalic artery plaque without stenosis. Mildly tortuous right CCA origin and proximal CCA. Patent right carotid bifurcation with comparatively mild soft plaque. No significant right ICA stenosis to the skull base. Left carotid system: Left CCA origin soft plaque with less than 50 % stenosis with respect to the distal vessel. Left carotid bifurcation, left ICA origin soft and calcified plaque with less than 50 % stenosis with respect to the distal vessel. No significant stenosis to the skull base. Vertebral arteries: Proximal right subclavian artery soft plaque with mild ulceration (series 5, image 320) but no stenosis. Right vertebral artery origin appears to remain normal. Right vertebral has a mildly late entry into the cervical transverse foramen and is patent to the skull base with no significant stenosis. Proximal left subclavian artery soft plaque with no significant stenosis. Left vertebral artery origin appears to remain normal. Fairly codominant left vertebral artery is patent to the skull base with no significant stenosis. CTA HEAD Posterior circulation: Bulky left V4 segment calcified plaque with severe stenosis series 5, image  165. This is proximal to the left PICA and left vertebrobasilar junctions which do remain patent. Contralateral right V4 severe plaque and stenosis also on image 166. Downstream right PICA and vertebrobasilar junction remain patent. Patent basilar artery with mild irregularity, no significant basilar stenosis. Patent basilar tip. Patent  SCA and PCA origins. Left posterior communicating artery is present. Bilateral PCA branches are patent with mild tortuosity. There is mild left PCA irregularity on the left. No significant PCA stenosis. Anterior circulation: Both ICA siphons are patent. But there is moderate left cavernous and severe right supraclinoid calcified plaque with moderate to severe left supraclinoid stenosis (series 5, image 130). Contralateral right siphon similar bulky calcified plaque. Mild to moderate right cavernous and supraclinoid segment stenosis (also on image 130). Both carotid termini remain patent. MCA and ACA origins are normal. Left posterior communicating artery origin is normal. Mildly tortuous A1 segments. Diminutive or absent anterior communicating artery. Bilateral ACA branches are within normal limits. Small distal right ICA infundibulum series 9, image 119 (normal variant). Right MCA M1 segment and bifurcation are patent without stenosis. Right MCA branches are within normal limits. Left MCA M1 segment is mildly tortuous, patent without stenosis. Patent left MCA trifurcation. Left MCA branches appear within normal limits. No CTA spot sign at the left lentiform hemorrhage (series 10, image 18). No abnormal vascularity there. Venous sinuses: Early contrast timing, grossly patent. Anatomic variants: None significant. Review of the MIP images confirms the above findings IMPRESSION: 1. Negative for large vessel occlusion. No CTA spot sign or vascular malformation at the left lentiform hemorrhage. 2. But positive for highly Abnormal Aortic Arch: Bulky atherosclerotic soft plaque with  multiple ulcerations, appearance suspicious for developing intramural hematoma, impending dissection (series 8, image 93). Aortic Atherosclerosis (ICD10-I70.0). 3. 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 - Severe Left ICA supraclinoid stenosis. - Moderate supraclinoid Right ICA stenosis. 4. Emphysema (ICD10-J43.9). Study discussed by telephone with Dr. Erick Blinks on 09/06/2023 at 05:44 . Electronically Signed   By: Odessa Fleming M.D.   On: 09/06/2023 05:44   CT HEAD CODE STROKE WO CONTRAST  Result Date: 09/06/2023 CLINICAL DATA:  Code stroke. 72 year old male neurologic deficit (not otherwise specified at the time of this report). EXAM: CT HEAD WITHOUT CONTRAST TECHNIQUE: Contiguous axial images were obtained from the base of the skull through the vertex without intravenous contrast. RADIATION DOSE REDUCTION: This exam was performed according to the departmental dose-optimization program which includes automated exposure control, adjustment of the mA and/or kV according to patient size and/or use of iterative reconstruction technique. COMPARISON:  Brain MRI 12/24/2019.  Head CT also that day. FINDINGS: Brain: Hyperdense hemorrhage left hemisphere is intra-axial, rounded and centered at the left lentiform. Mildly lobulated hyperdense blood products there encompass 24 x 22 by 27 mm (AP by transverse by CC) for an estimated intra-axial blood volume 7 mL. Mild surrounding edema. But no significant intracranial mass effect at this time. No midline shift. No IVH or extra-axial blood. Progressed and age indeterminate hypodense small-vessel ischemia in the contralateral right caudate nucleus since 2021. Stable gray-white differentiation elsewhere. No superimposed acute cortically based infarct identified. Normal basilar cisterns. Vascular: Calcified atherosclerosis at the skull base. No  suspicious intracranial vascular hyperdensity. Skull: Stable and intact. Sinuses/Orbits: Mild paranasal sinus mucosal thickening. Other: No acute orbit or scalp soft tissue finding, postoperative changes to both globes since 2021. ASPECTS [facility] Stroke Program Early CT Score) Total score (0-10 with 10 being normal): Not applicable, acute hemorrhage. IMPRESSION: 1. Positive for Acute 7 mL hemorrhage in the left lentiform nuclei. Mild surrounding edema but no significant mass effect. No IVH, extra-axial blood, or other complicating features. 2. Progressed since 2021 and age indeterminate small-vessel  ischemia in the contralateral right caudate nucleus. No superimposed acute cortically based infarct identified. These results were communicated to Dr. Derry Lory at 5:24 am on 09/06/2023 by text page via the [employer] messaging system. Electronically Signed   By: Odessa Fleming M.D.   On: 09/06/2023 05:25    Vitals:   09/06/23 0715 09/06/23 0720 09/06/23 0725 09/06/23 0730  BP: (!) 147/63 (!) 139/59 (!) 143/63 135/64  Pulse: 87 90 77 77  Resp: (!) 30 19 (!) 23 (!) 25  Temp:      TempSrc:      SpO2: 90% 92% 92% 93%  Weight:      Height:         PHYSICAL EXAM General:  Alert, well-nourished, well-developed patient in no acute distress Psych:  Mood and affect appropriate for situation CV: Regular rate and rhythm on monitor Respiratory:  Regular, unlabored respirations 7L [place] GI: Abdomen soft and nontender  NEURO:  Mental Status: AA&Ox3, patient is able to give clear and coherent history Speech/Language: speech is dysarthric  Cranial Nerves:  II: PERRL. Visual fields full.  III, IV, VI: EOMI. Eyelids elevate symmetrically.  V: Sensation is intact to light touch and symmetrical to face.  VII: Right facial droop VIII: hearing intact to voice. IX, X: Palate elevates symmetrically. Phonation is normal.  WU:[id number] shrug 5/5. XII: tongue is midline without fasciculations. Motor:  RUE- 1/5, muscle  contraction without movement RLE - 2/5 LUE and LLE with 5/5 movemet No movement distal with toes Tone: is normal and bulk is normal Sensation- Intact to light touch bilaterally. Extinction absent to light touch to DSS.   Coordination: FTN intact bilaterally, HKS: no ataxia in BLE.No drift.  Gait- deferred  ASSESSMENT/PLAN  ICH:  left BG ICH s/p kcentra, etiology: Likely uncontrolled HTN in the setting of Eliquis use Code Stroke CT head  Positive for Acute 7 mL hemorrhage in the left lentiform nuclei.   CTA head & neck No CTA spot sign or vascular malformation at the left lentiform hemorrhage MRI  Unchanged acute hemorrhage centered within the left lentiform nuclei with mild surrounding edema and mild mass effect on the left lateral ventricle. 2D Echo Pending  LDL 61 HgbA1c 6.2 VTE prophylaxis - SCDs Eliquis (apixaban) daily prior to admission, now on no anticoagulation due to ICH  Therapy recommendations:  Pending Disposition:  Pending   Large right groin pseudoaneurysm  CTA Chest/Abd/Pel- status post aorto bi-iliac bypass graft. Both limbs of the graft are widely patent. 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  VVS consulted, likely chronic  No pain in right groin, pulses palpable  May need repair under heparin IV, timing to be determined BP goal less than 140  Aortic atherosclerosis CTA neck  - bulky atherosclerotic soft plaque with multiple ulcerations, appearance suspicious for developing intramural hematoma, impending dissection  CTA Chest/Abd/Pel- Extensive aortic atherosclerosis with multiple penetrating ulcers and ulcerated plaques. 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 CTS consulted, no indication for intervention BP goal less than 140  Atrial  fibrillation Home Meds: eliquis  Reversed with ? Kcentra Hold off AC at this moment Consider to resume AC in 2 weeks if neuro stable  Hypertension Congestive heart failure  Dilated cardiomyopathy Home meds:  Norvasc, coreg, imdur, hydralazine, lasix BP unstable Cleviprex IV PRN labetalol and hydralazine Resume home PO  medications, now on amlodipine 10, coreg 6.25 bid, imdur 30, hydralazine 25 tid, lasix 20 Blood Pressure Goal: SBP less than 140  Hyperlipidemia Home meds: crestor 10mg  LDL 61 Resume home Crestor No high intensity statin given LDL at goal Continue statin on discharge  Dysphagia Patient has post-stroke dysphagia, SLP consulted On full liquid diet with nectar thick Advance diet as tolerated  Other Active Problems Hx of seizure- continue keppra 500mg  BID COPD on 4.5L [place] at baseline Now on 7L Follows with pulmonology  CXR ordered On lasix 20mg  daily   Hospital day # 0   Patient seen and examined by NP/APP with MD. MD to update note as needed.   Elmer Picker, DNP, FNP-BC [facility] Pager: [phone]  ATTENDING NOTE: I reviewed above note and agree with the assessment and plan. Pt was seen and examined.   No family at bedside.  RN at bedside.  Patient lying bed, awake alert, orientated x 3, however, severe dysarthria.  Able to name and repeat, follows simple commands, language output nonfluent largely due to severe dysarthria.  No visual field deficit, no gaze palsy, right facial droop, tongue slightly protrusion to the right.  Right upper extremity flaccid, right lower extremity proximal 2/5, distally 0/5.  Sensation symmetrical, left finger-to-nose intact.  Etiology patient ICH likely due to uncontrolled hypertension in the setting of Eliquis use.  Status post Kcentra reversal.  Found to have severe aortic atherosclerosis but no indication for intervention at this time.  Also has right groin large pseudoaneurysm, VVS on board, consider  chronic at this time.  May consider repair with heparin IV once able.  Will repeat a CT in 3 to 7 days, if stable consider heparin IV if needed.  BP still unstable, on Cleviprex, resume home BP meds.  Continue home Crestor and Keppra.  PT and OT pending.  For detailed assessment and plan, please refer to above/below as I have made changes wherever appropriate.   Marvel Plan, MD PhD Stroke Neurology 09/06/2023 4:31 PM  This patient is critically ill due to ICH, severe aortic atherosclerosis, right groin pseudoaneurysm, uncontrolled hypertension and at significant risk of neurological worsening, death form hematoma expansion, cerebral edema, aortic dissection, hypertensive encephalopathy. This patient's care requires constant monitoring of vital signs, hemodynamics, respiratory and cardiac monitoring, review of multiple databases, neurological assessment, discussion with family, other specialists and medical decision making of high complexity. I spent 30 minutes of neurocritical care time in the care of this patient.    To contact Stroke Continuity provider, please refer to [email or web address]. After hours, contact General Neurology

## 2023-09-06 NOTE — ED Provider Notes (Signed)
[place] [facility] DEPARTMENT AT [facility] Provider Note   CSN: [id number] Arrival date & time: 09/06/23  0507     History  No chief complaint on file.   Kyle [name] is a 72 y.o. male.  Level 5 caveat for acuity of condition.  Patient brought in by EMS as code stroke.  Here with right arm weakness and difficulty speaking.  Last seen normal was about 7 PM.  Does take Eliquis for history of atrial fibrillation.  History of COPD, CHF, pulmonary hypertension, CKD.  Denies any headache, chest pain or shortness of breath.  Complains of some difficulty speaking and slurred words and weakness in his right arm.  Denies any abdominal pain, vomiting, chest pain or shortness of breath.  Feels weakness to his right leg as well but more so into his right arm.  The history is provided by the patient and the EMS personnel.       Home Medications Prior to Admission medications   Medication Sig Start Date End Date Taking? Authorizing Provider  Accu-Chek FastClix Lancets MISC USE TO CHECK GLUCOSE AS INDICATED 08/16/23   Levin Erp, MD  albuterol (VENTOLIN HFA) 108 (90 Base) MCG/ACT inhaler Inhale 2 puffs into the lungs every 6 (six) hours as needed for wheezing or shortness of breath. 04/11/21   Brimage, Seward Meth, DO  amLODipine (NORVASC) 5 MG tablet Take 1 tablet (5 mg total) by mouth daily. 08/10/23   Levin Erp, MD  apixaban (ELIQUIS) 5 MG TABS tablet TAKE 1 TABLET TWICE DAILY 07/02/23   [place], Peter M, MD  carvedilol (COREG) 6.25 MG tablet TAKE 1 TABLET TWICE DAILY WITH MEALS (NEED MD APPOINTMENT) 03/01/23   [place], Peter M, MD  DROPLET PEN NEEDLES 32G X 4 MM MISC USE AS DIRECTED WITH INSULIN 05/10/23   Levin Erp, MD  Fluticasone-Umeclidin-Vilant (TRELEGY ELLIPTA) 200-62.5-25 MCG/ACT AEPB Inhale 1 puff into the lungs daily. 01/25/23   Oretha Milch, MD  furosemide (LASIX) 40 MG tablet TAKE 1/2 TABLET EVERY DAY 01/22/23   [place], Peter M, MD  glucose blood (ACCU-CHEK  GUIDE) test strip TEST BLOOD SUGAR TWICE DAILY 03/25/22   Jackelyn Poling, DO  guaiFENesin (MUCINEX) 600 MG 12 hr tablet Take 2 tablets (1,200 mg total) by mouth 2 (two) times daily. 11/30/18   Rolly Salter, MD  hydrALAZINE (APRESOLINE) 25 MG tablet TAKE 1 TABLET THREE TIMES DAILY (NEED APPOINTMENT WITH PCP FOR REFILLS) 08/19/23   Levin Erp, MD  insulin degludec (TRESIBA FLEXTOUCH) 100 UNIT/ML FlexTouch Pen Inject 14 Units into the skin daily. 06/15/23   Levin Erp, MD  isosorbide mononitrate (IMDUR) 30 MG 24 hr tablet TAKE 1 TABLET BY MOUTH ONCE DAILY. 07/26/23   [place], Peter M, MD  levETIRAcetam (KEPPRA) 500 MG tablet Take 1 tablet (500 mg total) by mouth daily. 03/10/23   Alicia Amel, MD  Multiple Vitamins-Minerals (CENTRUM SILVER 50+MEN) TABS Take 1 tablet by mouth daily with breakfast.    [provider]  NOVOLOG FLEXPEN 100 UNIT/ML FlexPen INJECT 5 UNITS SUBCUTANEOUSLY THREE TIMES DAILY WITH MEALS 10/28/22   Dameron, Nolberto Hanlon, DO  oxyCODONE (ROXICODONE) 5 MG immediate release tablet Take 1 tablet (5 mg total) by mouth every 6 (six) hours as needed for severe pain. 10/10/22   Smoot, Shawn Route, PA-C  OXYGEN Inhale 4.5 L/min into the lungs continuous.    [provider]  rosuvastatin (CRESTOR) 10 MG tablet TAKE 1 TABLET AT BEDTIME 08/30/23   Maeola Harman, MD  Allergies    Patient has no known allergies.    Review of Systems   Review of Systems  Unable to perform ROS: Acuity of condition    Physical Exam Updated Vital Signs BP 135/64   Pulse 77   Temp (!) 97.5 F (36.4 C) (Temporal)   Resp (!) 25   Ht 5\' 11"  (1.803 m)   Wt 74.2 kg   SpO2 93%   BMI 22.81 kg/m  Physical Exam Vitals and nursing note reviewed.  Constitutional:      General: He is not in acute distress.    Appearance: He is well-developed. He is ill-appearing.     Comments: Alert, oriented to person and place.  HENT:     Head: Normocephalic and atraumatic.      Mouth/Throat:     Pharynx: No oropharyngeal exudate.  Eyes:     Conjunctiva/sclera: Conjunctivae normal.     Pupils: Pupils are equal, round, and reactive to light.  Neck:     Comments: No meningismus. Cardiovascular:     Rate and Rhythm: Normal rate and regular rhythm.     Heart sounds: Normal heart sounds. No murmur heard. Pulmonary:     Effort: Pulmonary effort is normal. No respiratory distress.     Breath sounds: Normal breath sounds.  Abdominal:     Palpations: Abdomen is soft.     Tenderness: There is no abdominal tenderness. There is no guarding or rebound.  Musculoskeletal:        General: No tenderness. Normal range of motion.     Cervical back: Normal range of motion and neck supple.  Skin:    General: Skin is warm.  Neurological:     Mental Status: He is alert.     Cranial Nerves: Cranial nerve deficit present.     Motor: Weakness present. No abnormal muscle tone.     Coordination: Coordination abnormal.     Comments: R facial droop.  Tongue is midline.  Right arm is flaccid with no grip strength and unable to hold up against gravity. Able to lift left leg off the bed, some weakness lifting right leg off the bed.  Psychiatric:        Behavior: Behavior normal.     ED Results / Procedures / Treatments   Labs (all labs ordered are listed, but only abnormal results are displayed) Labs Reviewed  PROTIME-INR - Abnormal; Notable for the following components:      Result Value   Prothrombin Time 15.3 (*)    All other components within normal limits  APTT - Abnormal; Notable for the following components:   aPTT 38 (*)    All other components within normal limits  COMPREHENSIVE METABOLIC PANEL - Abnormal; Notable for the following components:   Glucose, Bld 114 (*)    Creatinine, Ser 1.77 (*)    Albumin 3.3 (*)    GFR, Estimated 41 (*)    All other components within normal limits  URINALYSIS, ROUTINE W REFLEX MICROSCOPIC - Abnormal; Notable for the following  components:   Color, Urine STRAW (*)    Protein, ur 30 (*)    All other components within normal limits  LIPID PANEL - Abnormal; Notable for the following components:   HDL 40 (*)    All other components within normal limits  HEMOGLOBIN A1C - Abnormal; Notable for the following components:   Hgb A1c MFr Bld 6.2 (*)    All other components within normal limits  I-STAT CHEM 8, ED -  Abnormal; Notable for the following components:   BUN 26 (*)    Creatinine, Ser 1.80 (*)    Glucose, Bld 114 (*)    Calcium, Ion 1.10 (*)    All other components within normal limits  MRSA NEXT GEN BY PCR, NASAL  ETHANOL  CBC  DIFFERENTIAL  RAPID URINE DRUG SCREEN, HOSP PERFORMED    EKG None  Radiology CT Angio Chest/Abd/Pel for Dissection W and/or Wo Contrast  Result Date: 09/06/2023 CLINICAL DATA:  72 year old male with signs and symptoms concerning for possible acute aortic syndrome. EXAM: CT ANGIOGRAPHY CHEST, ABDOMEN AND PELVIS TECHNIQUE: Non-contrast CT of the chest was initially obtained. Multidetector CT imaging through the chest, abdomen and pelvis was performed using the standard protocol during bolus administration of intravenous contrast. Multiplanar reconstructed images and MIPs were obtained and reviewed to evaluate the vascular anatomy. RADIATION DOSE REDUCTION: This exam was performed according to the departmental dose-optimization program which includes automated exposure control, adjustment of the mA and/or kV according to patient size and/or use of iterative reconstruction technique. CONTRAST:  OMNIPAQUE IOHEXOL 350 MG/ML SOLN COMPARISON:  No priors. FINDINGS: CTA CHEST FINDINGS Cardiovascular: Precontrast images demonstrate no crescentic high attenuation associated with the wall of the thoracic aorta to indicate the presence of acute intramural hemorrhage. Postcontrast images demonstrate extensive atherosclerosis of the thoracic aorta, without evidence of aneurysm or dissection (study  is substantially limited by extensive pulsation artifact). There are multiple penetrating ulcers in the thoracic aorta, most notably along the medial aspect of the thoracic aortic arch (axial image 37 of series 6). Multiple ulcerated plaques are also noted throughout the thoracic aorta. Atherosclerotic calcifications are also noted in the left main, left anterior descending and left circumflex coronary arteries. Severe thickening and calcification of the aortic valve is also noted. Heart size is mildly enlarged with concentric left ventricular hypertrophy. There is no significant pericardial fluid, thickening or pericardial calcification. Mediastinum/Nodes: There are multiple prominent borderline enlarged mediastinal and hilar lymph nodes, nonspecific. Esophagus is unremarkable in appearance. No axillary lymphadenopathy. Lungs/Pleura: In the inferior aspect of the right lower lobe (axial image 124 of series 7) there is a 5 mm pulmonary nodule. No other larger more suspicious appearing pulmonary nodules or masses are noted. No acute consolidative airspace disease. No pleural effusions. Diffuse bronchial wall thickening with moderate to severe centrilobular and paraseptal emphysema. Musculoskeletal: There are no aggressive appearing lytic or blastic lesions noted in the visualized portions of the skeleton. Review of the MIP images confirms the above findings. CTA ABDOMEN AND PELVIS FINDINGS VASCULAR Aorta: Extensive atherosclerosis throughout the abdominal aorta with multiple ulcerated plaques. 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 Patient is status post aorto bi femoral bypass graft, which is widely patent. 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 Celiac: Patent without evidence of aneurysm, dissection, vasculitis or significant stenosis. SMA: Patent without  evidence of aneurysm, dissection, vasculitis or significant stenosis. Renals: Left renal artery is widely patent. Severe high-grade stenosis just beyond the ostium of the right renal artery, best appreciated on coronal image 81 of series 9. IMA: Occluded at the ostium, but reconstituted distally, presumably from collateral pathways. Inflow: Native common iliac and external iliac arteries appear completely occluded on the right. On the left, the native common iliac artery is completely occluded with the left external iliac artery nearly completely occluded (some distal flow).  Aortobifemoral bypass graft is widely patent bilaterally. 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 Veins: No obvious venous abnormality within the limitations of this arterial phase study. Review of the MIP images confirms the above findings. NON-VASCULAR Hepatobiliary: Liver has a slightly shrunken appearance and nodular contour, suggesting a background of cirrhosis. No definite suspicious cystic or solid hepatic lesions. No intra or extrahepatic biliary ductal dilatation. Gallbladder is unremarkable in appearance. Pancreas: No pancreatic mass. No pancreatic ductal dilatation. No pancreatic or peripancreatic fluid collections or inflammatory changes. Spleen: Unremarkable. Adrenals/Urinary Tract: Moderate right renal atrophy. No suspicious renal lesions. No hydroureteronephrosis. Urinary bladder is unremarkable in appearance. Bilateral adrenal glands are unremarkable in appearance. Stomach/Bowel: The appearance of the stomach is normal. There is no pathologic dilatation of small bowel or colon. Numerous  colonic diverticuli are noted, without surrounding inflammatory changes to suggest an acute diverticulitis at this time. Normal appendix. Lymphatic: No lymphadenopathy noted in the abdomen or pelvis. Reproductive: Prostate gland and seminal vesicles are unremarkable in appearance. Other: No significant volume of ascites.  No pneumoperitoneum. Musculoskeletal: There are no aggressive appearing lytic or blastic lesions noted in the visualized portions of the skeleton. Review of the MIP images confirms the above findings. IMPRESSION: 1. 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 2. Patient is status post aorto bi-iliac bypass graft. Both limbs of the graft are widely patent. 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 Consultation with vascular surgery is strongly recommended for further clinical evaluation and management. 3. 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 4. Aortic atherosclerosis, in addition to left main and 2 vessel coronary artery disease. 5. There are calcifications of the aortic valve. Echocardiographic, as above. Correlation for evaluation of potential valvular dysfunction may be warranted if clinically indicated. 6. Cardiomegaly with concentric left ventricular hypertrophy. 7. Colonic diverticulosis without evidence of acute diverticulitis at this time. 8. 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 9. Additional incidental findings Electronically Signed   By: Trudie Reed M.D.   On: 09/06/2023 06:50   CT ANGIO HEAD NECK W WO CM (CODE STROKE)  Result Date: 09/06/2023 CLINICAL DATA:  72 year old male code stroke presentation  with right side weakness and left lentiform acute hemorrhage. EXAM: CT ANGIOGRAPHY HEAD AND NECK WITH AND WITHOUT CONTRAST TECHNIQUE: Multidetector CT imaging of the head and neck was performed using the standard protocol during bolus administration of intravenous contrast. Multiplanar CT image reconstructions and MIPs were obtained to evaluate the vascular anatomy. Carotid stenosis measurements (when applicable) are obtained utilizing NASCET criteria, using the distal internal carotid diameter as the denominator. RADIATION DOSE REDUCTION: This exam was performed according to the departmental dose-optimization program which includes automated exposure control, adjustment of the mA and/or kV according to patient size and/or use of iterative reconstruction technique. COMPARISON:  Head CT today 0519 hours. FINDINGS: CTA NECK Skeleton: Absent dentition. Multilevel cervical spine disc and endplate degeneration. No acute osseous abnormality identified.  Upper chest: At least moderate bilateral emphysema. Negative visible superior mediastinum except for highly abnormal aorta. Other neck: No acute finding. Aortic arch: Severe aortic arch atherosclerosis with multifocal ulcerated soft plaque, appearance suspicious for developing intramural hematoma/dissection. See series 7, image 171 and series 8, image 93. Underlying calcified plaque. Three vessel arch configuration. Right carotid system: Brachiocephalic artery plaque without stenosis. Mildly tortuous right CCA origin and proximal CCA. Patent right carotid bifurcation with comparatively mild soft plaque. No significant right ICA stenosis to the skull base. Left carotid system: Left CCA origin soft plaque with less than 50 % stenosis with respect to the distal vessel. Left carotid bifurcation, left ICA origin soft and calcified plaque with less than 50 % stenosis with respect to the distal vessel. No significant stenosis to the skull base. Vertebral arteries: Proximal right  subclavian artery soft plaque with mild ulceration (series 5, image 320) but no stenosis. Right vertebral artery origin appears to remain normal. Right vertebral has a mildly late entry into the cervical transverse foramen and is patent to the skull base with no significant stenosis. Proximal left subclavian artery soft plaque with no significant stenosis. Left vertebral artery origin appears to remain normal. Fairly codominant left vertebral artery is patent to the skull base with no significant stenosis. CTA HEAD Posterior circulation: Bulky left V4 segment calcified plaque with severe stenosis series 5, image 165. This is proximal to the left PICA and left vertebrobasilar junctions which do remain patent. Contralateral right V4 severe plaque and stenosis also on image 166. Downstream right PICA and vertebrobasilar junction remain patent. Patent basilar artery with mild irregularity, no significant basilar stenosis. Patent basilar tip. Patent SCA and PCA origins. Left posterior communicating artery is present. Bilateral PCA branches are patent with mild tortuosity. There is mild left PCA irregularity on the left. No significant PCA stenosis. Anterior circulation: Both ICA siphons are patent. But there is moderate left cavernous and severe right supraclinoid calcified plaque with moderate to severe left supraclinoid stenosis (series 5, image 130). Contralateral right siphon similar bulky calcified plaque. Mild to moderate right cavernous and supraclinoid segment stenosis (also on image 130). Both carotid termini remain patent. MCA and ACA origins are normal. Left posterior communicating artery origin is normal. Mildly tortuous A1 segments. Diminutive or absent anterior communicating artery. Bilateral ACA branches are within normal limits. Small distal right ICA infundibulum series 9, image 119 (normal variant). Right MCA M1 segment and bifurcation are patent without stenosis. Right MCA branches are within normal  limits. Left MCA M1 segment is mildly tortuous, patent without stenosis. Patent left MCA trifurcation. Left MCA branches appear within normal limits. No CTA spot sign at the left lentiform hemorrhage (series 10, image 18). No abnormal vascularity there. Venous sinuses: Early contrast timing, grossly patent. Anatomic variants: None significant. Review of the MIP images confirms the above findings IMPRESSION: 1. Negative for large vessel occlusion. No CTA spot sign or vascular malformation at the left lentiform hemorrhage. 2. But positive for highly Abnormal Aortic Arch: Bulky atherosclerotic soft plaque with multiple ulcerations, appearance suspicious for developing intramural hematoma, impending dissection (series 8, image 93). Aortic Atherosclerosis (ICD10-I70.0). 3. 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 - Severe Left ICA supraclinoid stenosis. - Moderate supraclinoid Right ICA stenosis. 4. Emphysema (ICD10-J43.9). Study discussed by telephone with Dr. Erick Blinks on 09/06/2023 at 05:44 . Electronically Signed   By: Althea Grimmer.D.  On: 09/06/2023 05:44   CT HEAD CODE STROKE WO CONTRAST  Result Date: 09/06/2023 CLINICAL DATA:  Code stroke. 72 year old male neurologic deficit (not otherwise specified at the time of this report). EXAM: CT HEAD WITHOUT CONTRAST TECHNIQUE: Contiguous axial images were obtained from the base of the skull through the vertex without intravenous contrast. RADIATION DOSE REDUCTION: This exam was performed according to the departmental dose-optimization program which includes automated exposure control, adjustment of the mA and/or kV according to patient size and/or use of iterative reconstruction technique. COMPARISON:  Brain MRI 12/24/2019.  Head CT also that day. FINDINGS: Brain: Hyperdense hemorrhage left hemisphere is intra-axial, rounded and  centered at the left lentiform. Mildly lobulated hyperdense blood products there encompass 24 x 22 by 27 mm (AP by transverse by CC) for an estimated intra-axial blood volume 7 mL. Mild surrounding edema. But no significant intracranial mass effect at this time. No midline shift. No IVH or extra-axial blood. Progressed and age indeterminate hypodense small-vessel ischemia in the contralateral right caudate nucleus since 2021. Stable gray-white differentiation elsewhere. No superimposed acute cortically based infarct identified. Normal basilar cisterns. Vascular: Calcified atherosclerosis at the skull base. No suspicious intracranial vascular hyperdensity. Skull: Stable and intact. Sinuses/Orbits: Mild paranasal sinus mucosal thickening. Other: No acute orbit or scalp soft tissue finding, postoperative changes to both globes since 2021. ASPECTS [facility] Stroke Program Early CT Score) Total score (0-10 with 10 being normal): Not applicable, acute hemorrhage. IMPRESSION: 1. Positive for Acute 7 mL hemorrhage in the left lentiform nuclei. Mild surrounding edema but no significant mass effect. No IVH, extra-axial blood, or other complicating features. 2. Progressed since 2021 and age indeterminate small-vessel ischemia in the contralateral right caudate nucleus. No superimposed acute cortically based infarct identified. These results were communicated to Dr. Derry Lory at 5:24 am on 09/06/2023 by text page via the [facility] messaging system. Electronically Signed   By: Odessa Fleming M.D.   On: 09/06/2023 05:25    Procedures .Critical Care  Performed by: Glynn Octave, MD Authorized by: Glynn Octave, MD   Critical care provider statement:    Critical care time (minutes):  60   Critical care time was exclusive of:  Separately billable procedures and treating other patients   Critical care was necessary to treat or prevent imminent or life-threatening deterioration of the following conditions:  CNS failure or  compromise   Critical care was time spent personally by me on the following activities:  Development of treatment plan with patient or surrogate, discussions with consultants, evaluation of patient's response to treatment, examination of patient, ordering and review of laboratory studies, ordering and review of radiographic studies, ordering and performing treatments and interventions, pulse oximetry, re-evaluation of patient's condition, review of old charts, blood draw for specimens and obtaining history from patient or surrogate   I assumed direction of critical care for this patient from another provider in my specialty: no     Care discussed with: admitting provider       Medications Ordered in ED Medications  prothrombin complex conc human (KCENTRA) IVPB 2,042 Units (has no administration in time range)   stroke: early stages of recovery book (has no administration in time range)  acetaminophen (TYLENOL) tablet 650 mg (has no administration in time range)    Or  acetaminophen (TYLENOL) 160 MG/5ML solution 650 mg (has no administration in time range)    Or  acetaminophen (TYLENOL) suppository 650 mg (has no administration in time range)  senna-docusate (Senokot-S) tablet 1 tablet (has no  administration in time range)  pantoprazole (PROTONIX) injection 40 mg (has no administration in time range)  clevidipine (CLEVIPREX) infusion 0.5 mg/mL (has no administration in time range)  hydrALAZINE (APRESOLINE) injection 20 mg (has no administration in time range)    ED Course/ Medical Decision Making/ A&P                                 Medical Decision Making Amount and/or Complexity of Data Reviewed Independent Historian: EMS Labs: ordered. Decision-making details documented in ED Course. Radiology: ordered and independent interpretation performed. Decision-making details documented in ED Course. ECG/medicine tests: ordered and independent interpretation performed. Decision-making details  documented in ED Course.  Risk Prescription drug management. Decision regarding hospitalization.   Code stroke with right arm weakness and difficulty speaking.  No chest pain or shortness of breath.  Hypertensive on arrival.  Patient taken to CT scan with Dr. Derry Lory of neurology.  CT confirms hemorrhagic intraparenchymal stroke.  Will initiate Cleviprex infusion.  Will also reverse Eliquis with Kcentra.  Protecting airway. Denies CP or back pain.  CTA concerning for intramural hematoma or aortic arch and concern for impending aortic dissection.  Will send back to CT for CTA aorta. D/w Dr. Cliffton Asters of thoracic surgery who will review images but states patient would not be surgical candidate with known ICH and inability to anticoagulate during surgery.  D/w Dr. Derry Lory. CTA aorta pending at time of admission.        Final Clinical Impression(s) / ED Diagnoses Final diagnoses:  Hemorrhagic stroke [facility])    Rx / DC Orders ED Discharge Orders     None         Jamielee Mchale, Jeannett Senior, MD 09/06/23 [phone]

## 2023-09-06 NOTE — Evaluation (Signed)
Clinical/Bedside Swallow Evaluation Patient Details  Name: Kyle [name] MRN: [id number] Date of Birth: September 10, 1951  Today's Date: 09/06/2023 Time: SLP Start Time (ACUTE ONLY): 1250 SLP Stop Time (ACUTE ONLY): 1322 SLP Time Calculation (min) (ACUTE ONLY): 32 min  Past Medical History:  Past Medical History:  Diagnosis Date   Aortic insufficiency    MODERATE WITH A BICUSPID AORTIC VAVLE   Arterial occlusive disease    MULTILEVEL   CHF (congestive heart failure) (HCC)    Chronic bronchitis (HCC)    CKD (chronic kidney disease) stage 3, GFR 30-59 ml/min (HCC)    COPD (chronic obstructive pulmonary disease) (HCC)    Dilated cardiomyopathy (HCC)    WITH EJECTION FRACTION DOWN TO 20-25%--WITH CONGESTIVE HEART FAILURE   Edema    LOWER EXTREMETIES   Emphysema of lung (HCC)    Hyperglycemia 01/2020   Hypertension    Normal coronary arteries 2009   Orthopnea    Peripheral arterial disease (HCC)    Pulmonary hypertension (HCC)    Past Surgical History:  Past Surgical History:  Procedure Laterality Date   ABDOMINAL AORTAGRAM N/A 11/05/2014   Procedure: ABDOMINAL Ronny Flurry;  Surgeon: Chuck Hint, MD;  Location: [facility] CATH LAB;  Service: Cardiovascular;  Laterality: N/A;   ABDOMINAL AORTOGRAM W/LOWER EXTREMITY Bilateral 04/21/2019   Procedure: ABDOMINAL AORTOGRAM W/LOWER EXTREMITY;  Surgeon: Chuck Hint, MD;  Location: [facility] INVASIVE CV LAB;  Service: Cardiovascular;  Laterality: Bilateral;   ABDOMINAL AORTOGRAM W/LOWER EXTREMITY Right 05/30/2021   Procedure: ABDOMINAL AORTOGRAM W/LOWER EXTREMITY;  Surgeon: Chuck Hint, MD;  Location: [facility] INVASIVE CV LAB;  Service: Cardiovascular;  Laterality: Right;   AORTA - BILATERAL FEMORAL ARTERY BYPASS GRAFT N/A 12/18/2014   Procedure: AORTOBIFEMORAL BYPASS GRAFT;  Surgeon: Chuck Hint, MD;  Location: [facility] OR;  Service: Vascular;  Laterality: N/A;   CARDIAC CATHETERIZATION  2009   Nl Cors, EF 20%   COLONOSCOPY      ENDARTERECTOMY FEMORAL Left 12/18/2014   Procedure: ENDARTERECTOMY FEMORAL;  Surgeon: Chuck Hint, MD;  Location: [facility] OR;  Service: Vascular;  Laterality: Left;   FALSE ANEURYSM REPAIR Right 04/24/2019   Procedure: REPAIR OF RIGHT FEMORAL ARTERY PSEUDOANEURYSM;  Surgeon: Larina Earthly, MD;  Location: [facility] OR;  Service: Vascular;  Laterality: Right;   FEMORAL-POPLITEAL BYPASS GRAFT  02/21/2011   right   FEMORAL-TIBIAL BYPASS GRAFT Right 04/24/2019   Procedure: REDO BYPASS GRAFT RIGHT FEMORAL-TIBIAL ARTERY USING LEFT LEG VEIN & HEMASHIELD GOLD 8mm GRAFT;  Surgeon: Larina Earthly, MD;  Location: [facility] OR;  Service: Vascular;  Laterality: Right;   IR FLUORO GUIDE CV LINE RIGHT  12/28/2019   IR REMOVAL TUN CV CATH W/O FL  01/11/2020   IR US GUIDE VASC ACCESS RIGHT  12/28/2019   OTHER SURGICAL HISTORY     POST RIGHT FEMOROPOPLITEAL BYPASS GRAFT   PERIPHERAL VASCULAR CATHETERIZATION  11/05/2014   Procedure: LOWER EXTREMITY ANGIOGRAPHY;  Surgeon: Chuck Hint, MD;  Location: [facility] CATH LAB;  Service: Cardiovascular;;   VEIN HARVEST Left 04/24/2019   Procedure: LEFT LEG SAPHENOUS VEIN HARVEST;  Surgeon: Larina Earthly, MD;  Location: [facility] OR;  Service: Vascular;  Laterality: Left;   HPI:  Kyle [name] is a 72 y.o. male with hx of Dilated cardiomyopathy, COPD on oxygen at baseline, PAD, Afibb on eliquis and took his last dose last night who went to sleep at 1900 and woke up and fell on the floor as soon as he tried to get up. Wife called  EMS. Noted to have elevated BP to 210 systolic. He was brought in as a code stroke for concern for potential ICH.  MRI Brain revealed "Unchanged acute hemorrhage centered within the left lentiform nuclei  with mild surrounding edema and mild mass effect on the left lateral  ventricle.".    Assessment / Plan / Recommendation  Clinical Impression   Pt presents with a moderate oral dysphagia and concerns for a pharyngeal dysphagia per clinical swallow assessment completed  today.   Oral deficits characterized by reduced labial weakness on R side resulting in consistent R anterior spillage of thin liquids by spoon and cup. Pt was able to achieve complete oral clearance with applesauce trials. Pharyngeal swallow initiation appeared intermittently delayed to palpation.  Pt was able to initiate a volitional swallow and laryngeal elevation appeared adequate to palpation. There was immediate and delayed coughing following spoon and cup sip of thin liquid, concerning for aspiration event.   Discussed recommendation for instrumental swallow assessment, FEES, prior to diet advancement. Pt agreeable to plan. FEES can be completed later this date. Recommend NPO with exception of PO meds crushed in applesauce and ice chips for oral comfort.   SLP Visit Diagnosis: Dysphagia, unspecified (R13.10)    Aspiration Risk  Mild aspiration risk    Diet Recommendation Ice chips PRN after oral care;NPO except meds    Medication Administration: Crushed with puree Supervision: Staff to assist with self feeding;Full supervision/cueing for compensatory strategies Postural Changes: Seated upright at 90 degrees    Other  Recommendations Oral Care Recommendations: Oral care QID    Recommendations for follow up therapy are one component of a multi-disciplinary discharge planning process, led by the attending physician.  Recommendations may be updated based on patient status, additional functional criteria and insurance authorization.  Follow up Recommendations Acute inpatient rehab (3hours/day)      Assistance Recommended at Discharge    Functional Status Assessment Patient has had a recent decline in their functional status and demonstrates the ability to make significant improvements in function in a reasonable and predictable amount of time.  Frequency and Duration min 1 x/week  1 week       Prognosis Prognosis for improved oropharyngeal function: Good      Swallow Study    General Date of Onset: 09/06/23 HPI: Kyle [name] is a 72 y.o. male with hx of Dilated cardiomyopathy, COPD on oxygen at baseline, PAD, Afibb on eliquis and took his last dose last night who went to sleep at 1900 and woke up and fell on the floor as soon as he tried to get up. Wife called EMS. Noted to have elevated BP to 210 systolic. He was brought in as a code stroke for concern for potential ICH.  MRI Brain revealed "Unchanged acute hemorrhage centered within the left lentiform nuclei  with mild surrounding edema and mild mass effect on the left lateral  ventricle.". Type of Study: Bedside Swallow Evaluation Previous Swallow Assessment: none per chart Diet Prior to this Study: NPO Temperature Spikes Noted: No Respiratory Status: Nasal cannula (7L) History of Recent Intubation: No Behavior/Cognition: Alert;Cooperative;Pleasant mood Oral Cavity Assessment: Within Functional Limits Oral Care Completed by SLP: No Oral Cavity - Dentition: Dentures, top;Dentures, bottom Self-Feeding Abilities: Needs assist Patient Positioning: Upright in bed Baseline Vocal Quality: Normal Volitional Cough: Strong Volitional Swallow: Able to elicit    Oral/Motor/Sensory Function Overall Oral Motor/Sensory Function: Moderate impairment Facial ROM: Reduced right Facial Symmetry: Abnormal symmetry right Facial Strength: Reduced right Lingual ROM:  Within Functional Limits Lingual Symmetry: Within Functional Limits Lingual Strength: Within Functional Limits Velum: Within Functional Limits Mandible: Within Functional Limits   Ice Chips Ice chips: Impaired Oral Phase Impairments: Reduced labial seal;Reduced lingual movement/coordination Oral Phase Functional Implications: Right anterior spillage Pharyngeal Phase Impairments: Suspected delayed Swallow   Thin Liquid Thin Liquid: Impaired Presentation: Spoon;Cup Oral Phase Impairments: Reduced labial seal Oral Phase Functional Implications: Right anterior  spillage Pharyngeal  Phase Impairments: Cough - Immediate;Multiple swallows;Suspected delayed Swallow    Nectar Thick Nectar Thick Liquid: Not tested   Honey Thick Honey Thick Liquid: Not tested   Puree Puree: Impaired Presentation: Spoon Pharyngeal Phase Impairments: Suspected delayed Swallow   Solid     Solid: Not tested      Ellery Plunk 09/06/2023,3:10 PM

## 2023-09-06 NOTE — H&P (Addendum)
NEUROLOGY H and P NOTE   Date of service: September 06, 2023 Patient Name: TAYLAR DENN MRN:  [id number] DOB:  1951/06/25 Chief Complaint: "R sided weakness" Requesting Provider: Erick Blinks, MD  History of Present Illness  JAHKEEM PAYNTER is a 72 y.o. male with hx of Dilated cardiomyopathy, COPD on oxygen at baseline, PAD, Afibb on eliquis and took his last dose last night who went to sleep at 1900 and woke up and fell on the floor as soon as he tried to get up. Wife called EMS. Noted to have elevated BP to 210 systolic. He was brought in as a code stroke for concern for potential ICH.    LKW: 1900 Modified rankin score: 0-Completely asymptomatic and back to baseline post- stroke IV Thrombolysis: not offered, ICH EVT: not offerd, ICH ICH Score:0  NIHSS components Score: Comment  1a Level of Conscious 0[x]  1[]  2[]  3[]      1b LOC Questions 0[x]  1[]  2[]       1c LOC Commands 0[x]  1[]  2[]       2 Best Gaze 0[x]  1[]  2[]       3 Visual 0[x]  1[]  2[]  3[]      4 Facial Palsy 0[]  1[x]  2[]  3[]      5a Motor Arm - left 0[x]  1[]  2[]  3[]  4[]  UN[]    5b Motor Arm - Right 0[]  1[]  2[]  3[x]  4[]  UN[]    6a Motor Leg - Left 0[x]  1[]  2[]  3[]  4[]  UN[]    6b Motor Leg - Right 0[]  1[]  2[]  3[x]  4[]  UN[]    7 Limb Ataxia 0[x]  1[]  2[]  3[]  UN[]     8 Sensory 0[]  1[x]  2[]  UN[]      9 Best Language 0[x]  1[]  2[]  3[]      10 Dysarthria 0[]  1[x]  2[]  UN[]      11 Extinct. and Inattention 0[x]  1[]  2[]       TOTAL:        ROS  Comprehensive ROS performed and pertinent positives documented in HPI   Past History   Past Medical History:  Diagnosis Date   Aortic insufficiency    MODERATE WITH A BICUSPID AORTIC VAVLE   Arterial occlusive disease    MULTILEVEL   CHF (congestive heart failure) (HCC)    Chronic bronchitis (HCC)    CKD (chronic kidney disease) stage 3, GFR 30-59 ml/min (HCC)    COPD (chronic obstructive pulmonary disease) (HCC)    Dilated cardiomyopathy (HCC)    WITH EJECTION FRACTION DOWN TO  20-25%--WITH CONGESTIVE HEART FAILURE   Edema    LOWER EXTREMETIES   Emphysema of lung (HCC)    Hyperglycemia 01/2020   Hypertension    Normal coronary arteries 2009   Orthopnea    Peripheral arterial disease (HCC)    Pulmonary hypertension (HCC)     Past Surgical History:  Procedure Laterality Date   ABDOMINAL AORTAGRAM N/A 11/05/2014   Procedure: ABDOMINAL Ronny Flurry;  Surgeon: Chuck Hint, MD;  Location: [facility] CATH LAB;  Service: Cardiovascular;  Laterality: N/A;   ABDOMINAL AORTOGRAM W/LOWER EXTREMITY Bilateral 04/21/2019   Procedure: ABDOMINAL AORTOGRAM W/LOWER EXTREMITY;  Surgeon: Chuck Hint, MD;  Location: [facility] INVASIVE CV LAB;  Service: Cardiovascular;  Laterality: Bilateral;   ABDOMINAL AORTOGRAM W/LOWER EXTREMITY Right 05/30/2021   Procedure: ABDOMINAL AORTOGRAM W/LOWER EXTREMITY;  Surgeon: Chuck Hint, MD;  Location: [facility] INVASIVE CV LAB;  Service: Cardiovascular;  Laterality: Right;   AORTA - BILATERAL FEMORAL ARTERY BYPASS GRAFT N/A 12/18/2014   Procedure: AORTOBIFEMORAL BYPASS GRAFT;  Surgeon: Chuck Hint, MD;  Location: [facility] OR;  Service: Vascular;  Laterality: N/A;   CARDIAC CATHETERIZATION  2009   Nl Cors, EF 20%   COLONOSCOPY     ENDARTERECTOMY FEMORAL Left 12/18/2014   Procedure: ENDARTERECTOMY FEMORAL;  Surgeon: Chuck Hint, MD;  Location: [facility] OR;  Service: Vascular;  Laterality: Left;   FALSE ANEURYSM REPAIR Right 04/24/2019   Procedure: REPAIR OF RIGHT FEMORAL ARTERY PSEUDOANEURYSM;  Surgeon: Larina Earthly, MD;  Location: [facility] OR;  Service: Vascular;  Laterality: Right;   FEMORAL-POPLITEAL BYPASS GRAFT  02/21/2011   right   FEMORAL-TIBIAL BYPASS GRAFT Right 04/24/2019   Procedure: REDO BYPASS GRAFT RIGHT FEMORAL-TIBIAL ARTERY USING LEFT LEG VEIN & HEMASHIELD GOLD 8mm GRAFT;  Surgeon: Larina Earthly, MD;  Location: [facility] OR;  Service: Vascular;  Laterality: Right;   IR FLUORO GUIDE CV LINE RIGHT  12/28/2019   IR REMOVAL TUN CV CATH W/O  FL  01/11/2020   IR US GUIDE VASC ACCESS RIGHT  12/28/2019   OTHER SURGICAL HISTORY     POST RIGHT FEMOROPOPLITEAL BYPASS GRAFT   PERIPHERAL VASCULAR CATHETERIZATION  11/05/2014   Procedure: LOWER EXTREMITY ANGIOGRAPHY;  Surgeon: Chuck Hint, MD;  Location: [facility] CATH LAB;  Service: Cardiovascular;;   VEIN HARVEST Left 04/24/2019   Procedure: LEFT LEG SAPHENOUS VEIN HARVEST;  Surgeon: Larina Earthly, MD;  Location: [facility] OR;  Service: Vascular;  Laterality: Left;    Family History: Family History  Problem Relation Age of Onset   Diabetes Mother    Hyperlipidemia Sister    Hypertension Sister     Social History  reports that he quit smoking about 4 years ago. His smoking use included cigarettes. He started smoking about 44 years ago. He has a 40 pack-year smoking history. He has never been exposed to tobacco smoke. He has never used smokeless tobacco. He reports that he does not drink alcohol and does not use drugs.  No Known Allergies  Medications   Current Facility-Administered Medications:    [START ON 09/07/2023]  stroke: early stages of recovery book, , Does not apply, Once, Erick Blinks, MD   acetaminophen (TYLENOL) tablet 650 mg, 650 mg, Oral, Q4H PRN **OR** acetaminophen (TYLENOL) 160 MG/5ML solution 650 mg, 650 mg, Per Tube, Q4H PRN **OR** acetaminophen (TYLENOL) suppository 650 mg, 650 mg, Rectal, Q4H PRN, Erick Blinks, MD   clevidipine (CLEVIPREX) infusion 0.5 mg/mL, 0-21 mg/hr, Intravenous, Continuous, Erick Blinks, MD   hydrALAZINE (APRESOLINE) injection 20 mg, 20 mg, Intravenous, Once, Erick Blinks, MD   pantoprazole (PROTONIX) injection 40 mg, 40 mg, Intravenous, QHS, Erick Blinks, MD   prothrombin complex conc human (KCENTRA) IVPB 2,042 Units, 2,042 Units, Intravenous, STAT, Erick Blinks, MD   senna-docusate (Senokot-S) tablet 1 tablet, 1 tablet, Oral, BID, Erick Blinks, MD  Current Outpatient Medications:    Accu-Chek FastClix  Lancets MISC, USE TO CHECK GLUCOSE AS INDICATED, Disp: 204 each, Rfl: 3   albuterol (VENTOLIN HFA) 108 (90 Base) MCG/ACT inhaler, Inhale 2 puffs into the lungs every 6 (six) hours as needed for wheezing or shortness of breath., Disp: 8 g, Rfl: 2   amLODipine (NORVASC) 5 MG tablet, Take 1 tablet (5 mg total) by mouth daily., Disp: 90 tablet, Rfl: 0   apixaban (ELIQUIS) 5 MG TABS tablet, TAKE 1 TABLET TWICE DAILY, Disp: 180 tablet, Rfl: 1   carvedilol (COREG) 6.25 MG tablet, TAKE 1 TABLET TWICE DAILY WITH MEALS (NEED MD APPOINTMENT), Disp: 180 tablet, Rfl: 3   DROPLET PEN NEEDLES 32G X 4 MM MISC,  USE AS DIRECTED WITH INSULIN, Disp: 300 each, Rfl: 3   Fluticasone-Umeclidin-Vilant (TRELEGY ELLIPTA) 200-62.5-25 MCG/ACT AEPB, Inhale 1 puff into the lungs daily., Disp: 180 each, Rfl: 3   furosemide (LASIX) 40 MG tablet, TAKE 1/2 TABLET EVERY DAY, Disp: 45 tablet, Rfl: 3   glucose blood (ACCU-CHEK GUIDE) test strip, TEST BLOOD SUGAR TWICE DAILY, Disp: 200 strip, Rfl: 2   guaiFENesin (MUCINEX) 600 MG 12 hr tablet, Take 2 tablets (1,200 mg total) by mouth 2 (two) times daily., Disp: 30 tablet, Rfl: 0   hydrALAZINE (APRESOLINE) 25 MG tablet, TAKE 1 TABLET THREE TIMES DAILY (NEED APPOINTMENT WITH PCP FOR REFILLS), Disp: 90 tablet, Rfl: 0   insulin degludec (TRESIBA FLEXTOUCH) 100 UNIT/ML FlexTouch Pen, Inject 14 Units into the skin daily., Disp: 3 mL, Rfl: 1   isosorbide mononitrate (IMDUR) 30 MG 24 hr tablet, TAKE 1 TABLET BY MOUTH ONCE DAILY., Disp: 90 tablet, Rfl: 1   levETIRAcetam (KEPPRA) 500 MG tablet, Take 1 tablet (500 mg total) by mouth daily., Disp: 60 tablet, Rfl: 0   Multiple Vitamins-Minerals (CENTRUM SILVER 50+MEN) TABS, Take 1 tablet by mouth daily with breakfast., Disp: , Rfl:    NOVOLOG FLEXPEN 100 UNIT/ML FlexPen, INJECT 5 UNITS SUBCUTANEOUSLY THREE TIMES DAILY WITH MEALS, Disp: 3 mL, Rfl: 0   oxyCODONE (ROXICODONE) 5 MG immediate release tablet, Take 1 tablet (5 mg total) by mouth every 6  (six) hours as needed for severe pain., Disp: 15 tablet, Rfl: 0   OXYGEN, Inhale 4.5 L/min into the lungs continuous., Disp: , Rfl:    rosuvastatin (CRESTOR) 10 MG tablet, TAKE 1 TABLET AT BEDTIME, Disp: 90 tablet, Rfl: 3  Vitals   Vitals:   09/06/23 0500  Weight: 74.2 kg    Body mass index is 22.81 kg/m.  Physical Exam   General: Laying comfortably in bed; in no acute distress.  HENT: Normal oropharynx and mucosa. Normal external appearance of ears and nose.  Neck: Supple, no pain or tenderness  CV: No JVD. No peripheral edema.  Pulmonary: Symmetric Chest rise. Normal respiratory effort.  Abdomen: Soft to touch, non-tender.  Ext: No cyanosis, edema, or deformity  Skin: No rash. Normal palpation of skin.   Musculoskeletal: Normal digits and nails by inspection. No clubbing.   Neurologic Examination  Mental status/Cognition: Alert, oriented to self, place, month and year, good attention.  Speech/language: dysarthric speech, non fluent, comprehension intact, object naming intact, repetition intact.  Cranial nerves:   CN II Pupils equal and reactive to light, no VF deficits    CN III,IV,VI EOM intact, no gaze preference or deviation, no nystagmus    CN V normal sensation in V1, V2, and V3 segments bilaterally    CN VII R facial droop   CN VIII normal hearing to speech    CN IX & X normal palatal elevation, no uvular deviation    CN XI 5/5 head turn and 5/5 shoulder shrug bilaterally    CN XII midline tongue protrusion    Motor:  Muscle bulk: normal, tone flaccid in RUE and RLE Mvmt Root Nerve  Muscle Right Left Comments  SA C5/6 Ax Deltoid 1 5   EF C5/6 Mc Biceps 1 5   EE C6/7/8 Rad Triceps 1 5   WF C6/7 Med FCR     WE C7/8 PIN ECU     F Ab C8/T1 U ADM/FDI 1 5   HF L1/2/3 Fem Illopsoas 1 5   KE L2/3/4 Fem Quad     DF  L4/5 D Peron Tib Ant 0 5   PF S1/2 Tibial Grc/Sol 0 5    Sensation:  Light touch Intact throughout   Pin prick    Temperature    Vibration    Proprioception    Coordination/Complex Motor:  - Finger to Nose intact on the left. - Heel to shin unable to do - Rapid alternating movement intact on the left only - Gait: deferred.   Labs/Imaging/Neurodiagnostic studies   CBC:  Recent Labs  Lab 09/16/2023 0514 September 16, 2023 0516  WBC 7.4  --   NEUTROABS 4.6  --   HGB 14.2 15.3  HCT 44.5 45.0  MCV 94.7  --   PLT 159  --     Basic Metabolic Panel:  Lab Results  Component Value Date   NA 144 Sep 16, 2023   K 4.2 09/16/2023   CO2 27 02/03/2023   GLUCOSE 114 (H) 09/16/23   BUN 26 (H) 09/16/2023   CREATININE 1.80 (H) 09/16/2023   CALCIUM 10.0 02/03/2023   GFRNONAA 45 (L) 10/10/2022   GFRAA 46 (L) 02/23/2020    Lipid Panel:  Lab Results  Component Value Date   LDLCALC 71 02/03/2023    HgbA1c:  Lab Results  Component Value Date   HGBA1C 6.0 09/04/2022    Urine Drug Screen:     Component Value Date/Time   LABOPIA NONE DETECTED 11/21/2018 0211   COCAINSCRNUR NONE DETECTED 11/21/2018 0211   LABBENZ NONE DETECTED 11/21/2018 0211   AMPHETMU NONE DETECTED 11/21/2018 0211   THCU NONE DETECTED 11/21/2018 0211   LABBARB NONE DETECTED 11/21/2018 0211     Alcohol Level     Component Value Date/Time   ETH <10 11/20/2018 1927    INR  Lab Results  Component Value Date   INR 1.2 05/30/2021    APTT  Lab Results  Component Value Date   APTT >200 (HH) 12/27/2019    AED levels: No results found for: "PHENYTOIN", "ZONISAMIDE", "LAMOTRIGINE", "LEVETIRACETA"    CT Head without contrast(Personally reviewed): L lentiform nucleus ICH.  CT angio Head and Neck with contrast(Personally reviewed): No aneurysms. No spot sign.  MRI Brain(Personally reviewed): Pending  Impression   GIANNCARLO BECHEN is a 72 y.o. male with hx of Afibb on eliquis who presents with R sided weakness and hypertensive to 200s systolic and found to have L lentiform nucleus ICH with no IVH and an ICH score of 0.  He took his last dose of  eliquis yesterday evening.  Recommendations  - Admit to ICU - Stability scan in 6 hours or STAT with any neurological decline - Frequent neuro checks; q49min for 1 hour, then q1hour - No antiplatelets or anticoagulants due to ICH - SCD for DVT prophylaxis, pharmacological DVT ppx at 24 hours if ICH is stable - Blood pressure control with goal systolic 130 - 150, cleverplex and Hydralazine PRN(bradycardic) - Stroke labs, HgbA1c, fasting lipid panel - MRI brain with and without contrast when stabilized to evaluate for underlying mass - MRA without contrast of the brain and Vasc US carotid duplex to evaluate for underlying vascular abnormality. - Risk factor modification - Echocardiogram - PT consult, OT consult, Speech consult. - Stroke team to follow - Reverse eliquis with Kcentra.   Update: [id number]: Discussed CTA with neuroradiology with concern about ulcerated plaque vs hematoma in the aorta and impending dissection and recommended getting CT angio dissection study. CT dissection ordered. Dr. Manus Gunning reaching out to CT surgery. Patient denies any chest or back pain.  Update:  6:32 AM Dr. Cliffton Asters with CT surgery reached out to Dr. Manus Gunning. They will see patient today. No intervention at this time. Blood pressure goal of less than 140. Will change parameters for Cleviprex to keep below 140 systolic. ______________________________________________________________________  This patient is critically ill and at significant risk of neurological worsening, death and care requires constant monitoring of vital signs, hemodynamics,respiratory and cardiac monitoring, neurological assessment, discussion with family, other specialists and medical decision making of high complexity. I spent 40 minutes of neurocritical care time  in the care of  this patient. This was time spent independent of any time provided by nurse practitioner or PA.  Erick Blinks [facility] 09/06/2023  5:29  AM   Signed,  Erick Blinks [facility]

## 2023-09-06 NOTE — Consult Note (Addendum)
Hospital Consult    Reason for Consult:  R groin pseudoaneurysm Requesting Physician:  Neurology MRN #:  [id number]  History of Present Illness: This is a 72 y.o. male brought to the hospital by EMS as a code stroke.  He was found to have a hemorrhagic left hemispheric stroke resulting in difficult speech and right arm and leg weakness.  He is well-known to [facility] with history of aortobifemoral bypass graft in 2016.  He also had right femoral to popliteal bypass and 2010.  He underwent repair of right femoral artery pseudoaneurysm with interposition dacryon graft from the right limb of aortobifemoral bypass graft sewn into and into the profunda.  This also involves a femoral to peroneal artery bypass with vein.  Workup included CTA demonstrating anastomotic pseudoaneurysm in the right groin.  Patient denies any pain in the right groin.  Eliquis for atrial fibrillation has been held due to hemorrhagic stroke.  He currently denies any rest pain in the right foot.  Past Medical History:  Diagnosis Date   Aortic insufficiency    MODERATE WITH A BICUSPID AORTIC VAVLE   Arterial occlusive disease    MULTILEVEL   CHF (congestive heart failure) (HCC)    Chronic bronchitis (HCC)    CKD (chronic kidney disease) stage 3, GFR 30-59 ml/min (HCC)    COPD (chronic obstructive pulmonary disease) (HCC)    Dilated cardiomyopathy (HCC)    WITH EJECTION FRACTION DOWN TO 20-25%--WITH CONGESTIVE HEART FAILURE   Edema    LOWER EXTREMETIES   Emphysema of lung (HCC)    Hyperglycemia 01/2020   Hypertension    Normal coronary arteries 2009   Orthopnea    Peripheral arterial disease (HCC)    Pulmonary hypertension (HCC)     Past Surgical History:  Procedure Laterality Date   ABDOMINAL AORTAGRAM N/A 11/05/2014   Procedure: ABDOMINAL Ronny Flurry;  Surgeon: Chuck Hint, MD;  Location: [facility] CATH LAB;  Service: Cardiovascular;  Laterality: N/A;   ABDOMINAL AORTOGRAM W/LOWER EXTREMITY Bilateral 04/21/2019    Procedure: ABDOMINAL AORTOGRAM W/LOWER EXTREMITY;  Surgeon: Chuck Hint, MD;  Location: [facility] INVASIVE CV LAB;  Service: Cardiovascular;  Laterality: Bilateral;   ABDOMINAL AORTOGRAM W/LOWER EXTREMITY Right 05/30/2021   Procedure: ABDOMINAL AORTOGRAM W/LOWER EXTREMITY;  Surgeon: Chuck Hint, MD;  Location: [facility] INVASIVE CV LAB;  Service: Cardiovascular;  Laterality: Right;   AORTA - BILATERAL FEMORAL ARTERY BYPASS GRAFT N/A 12/18/2014   Procedure: AORTOBIFEMORAL BYPASS GRAFT;  Surgeon: Chuck Hint, MD;  Location: [facility] OR;  Service: Vascular;  Laterality: N/A;   CARDIAC CATHETERIZATION  2009   Nl Cors, EF 20%   COLONOSCOPY     ENDARTERECTOMY FEMORAL Left 12/18/2014   Procedure: ENDARTERECTOMY FEMORAL;  Surgeon: Chuck Hint, MD;  Location: [facility] OR;  Service: Vascular;  Laterality: Left;   FALSE ANEURYSM REPAIR Right 04/24/2019   Procedure: REPAIR OF RIGHT FEMORAL ARTERY PSEUDOANEURYSM;  Surgeon: Larina Earthly, MD;  Location: [facility] OR;  Service: Vascular;  Laterality: Right;   FEMORAL-POPLITEAL BYPASS GRAFT  02/21/2011   right   FEMORAL-TIBIAL BYPASS GRAFT Right 04/24/2019   Procedure: REDO BYPASS GRAFT RIGHT FEMORAL-TIBIAL ARTERY USING LEFT LEG VEIN & HEMASHIELD GOLD 8mm GRAFT;  Surgeon: Larina Earthly, MD;  Location: [facility] OR;  Service: Vascular;  Laterality: Right;   IR FLUORO GUIDE CV LINE RIGHT  12/28/2019   IR REMOVAL TUN CV CATH W/O FL  01/11/2020   IR US GUIDE VASC ACCESS RIGHT  12/28/2019   OTHER SURGICAL HISTORY  POST RIGHT FEMOROPOPLITEAL BYPASS GRAFT   PERIPHERAL VASCULAR CATHETERIZATION  11/05/2014   Procedure: LOWER EXTREMITY ANGIOGRAPHY;  Surgeon: Chuck Hint, MD;  Location: [facility] CATH LAB;  Service: Cardiovascular;;   VEIN HARVEST Left 04/24/2019   Procedure: LEFT LEG SAPHENOUS VEIN HARVEST;  Surgeon: Larina Earthly, MD;  Location: [facility] OR;  Service: Vascular;  Laterality: Left;    No Known Allergies  Prior to Admission medications   Medication Sig Start  Date End Date Taking? Authorizing Provider  albuterol (VENTOLIN HFA) 108 (90 Base) MCG/ACT inhaler Inhale 2 puffs into the lungs every 6 (six) hours as needed for wheezing or shortness of breath. 04/11/21  Yes Brimage, Vondra, DO  amLODipine (NORVASC) 5 MG tablet Take 1 tablet (5 mg total) by mouth daily. 08/10/23  Yes Levin Erp, MD  apixaban (ELIQUIS) 5 MG TABS tablet TAKE 1 TABLET TWICE DAILY 07/02/23  Yes [place], Peter M, MD  carvedilol (COREG) 6.25 MG tablet TAKE 1 TABLET TWICE DAILY WITH MEALS (NEED MD APPOINTMENT) 03/01/23  Yes [place], Peter M, MD  Fluticasone-Umeclidin-Vilant (TRELEGY ELLIPTA) 200-62.5-25 MCG/ACT AEPB Inhale 1 puff into the lungs daily. 01/25/23  Yes Oretha Milch, MD  furosemide (LASIX) 40 MG tablet TAKE 1/2 TABLET EVERY DAY 01/22/23  Yes [place], Peter M, MD  guaiFENesin (MUCINEX) 600 MG 12 hr tablet Take 2 tablets (1,200 mg total) by mouth 2 (two) times daily. 11/30/18  Yes Rolly Salter, MD  hydrALAZINE (APRESOLINE) 25 MG tablet TAKE 1 TABLET THREE TIMES DAILY (NEED APPOINTMENT WITH PCP FOR REFILLS) Patient taking differently: Take 25 mg by mouth 3 (three) times daily. (NEED APPOINTMENT WITH PCP FOR REFILLS) 08/19/23  Yes Levin Erp, MD  insulin degludec (TRESIBA FLEXTOUCH) 100 UNIT/ML FlexTouch Pen Inject 14 Units into the skin daily. 06/15/23  Yes Levin Erp, MD  isosorbide mononitrate (IMDUR) 30 MG 24 hr tablet TAKE 1 TABLET BY MOUTH ONCE DAILY. 07/26/23  Yes [place], Peter M, MD  levETIRAcetam (KEPPRA) 500 MG tablet Take 1 tablet (500 mg total) by mouth daily. 03/10/23  Yes Alicia Amel, MD  Multiple Vitamins-Minerals (CENTRUM SILVER 50+MEN) TABS Take 1 tablet by mouth daily with breakfast.   Yes [provider]  NOVOLOG FLEXPEN 100 UNIT/ML FlexPen INJECT 5 UNITS SUBCUTANEOUSLY THREE TIMES DAILY WITH MEALS 10/28/22  Yes Dameron, Nolberto Hanlon, DO  OXYGEN Inhale 4.5 L/min into the lungs continuous.   Yes [provider]  rosuvastatin (CRESTOR) 10 MG  tablet TAKE 1 TABLET AT BEDTIME 08/30/23  Yes Maeola Harman, MD  Accu-Chek FastClix Lancets MISC USE TO CHECK GLUCOSE AS INDICATED 08/16/23   Levin Erp, MD  DROPLET PEN NEEDLES 32G X 4 MM MISC USE AS DIRECTED WITH INSULIN 05/10/23   Levin Erp, MD  glucose blood (ACCU-CHEK GUIDE) test strip TEST BLOOD SUGAR TWICE DAILY 03/25/22   Jackelyn Poling, DO    Social History   Socioeconomic History   Marital status: Married    Spouse name: Not on file   Number of children: 1   Years of education: Not on file   Highest education level: Not on file  Occupational History   Occupation: retired    Associate Professor: [employer]  Tobacco Use   Smoking status: Former    Current packs/day: 0.00    Average packs/day: 1 pack/day for 40.0 years (40.0 ttl pk-yrs)    Types: Cigarettes    Start date: 10/15/1978    Quit date: 10/15/2018    Years since quitting: 4.8    Passive exposure:  Never   Smokeless tobacco: Never   Tobacco comments:    quit 2010 and quit again 2019  Vaping Use   Vaping status: Never Used  Substance and Sexual Activity   Alcohol use: No    Alcohol/week: 0.0 standard drinks of alcohol   Drug use: No   Sexual activity: Not on file  Other Topics Concern   Not on file  Social History Narrative   Not on file   Social Determinants of Health   Financial Resource Strain: Low Risk  (03/05/2023)   Overall Financial Resource Strain (CARDIA)    Difficulty of Paying Living Expenses: Not hard at all  Food Insecurity: No Food Insecurity (03/05/2023)   Hunger Vital Sign    Worried About Running Out of Food in the Last Year: Never true    Ran Out of Food in the Last Year: Never true  Transportation Needs: No Transportation Needs (03/05/2023)   PRAPARE - Administrator, Civil Service (Medical): No    Lack of Transportation (Non-Medical): No  Physical Activity: Sufficiently Active (03/05/2023)   Exercise Vital Sign    Days of Exercise per Week: 5 days     Minutes of Exercise per Session: 30 min  Stress: No Stress Concern Present (03/05/2023)   [employer] of Occupational Health - Occupational Stress Questionnaire    Feeling of Stress : Not at all  Social Connections: Socially Integrated (03/05/2023)   Social Connection and Isolation Panel [NHANES]    Frequency of Communication with Friends and Family: More than three times a week    Frequency of Social Gatherings with Friends and Family: Three times a week    Attends Religious Services: More than 4 times per year    Active Member of Clubs or Organizations: Yes    Attends Banker Meetings: More than 4 times per year    Marital Status: Married  Catering manager Violence: Not At Risk (03/05/2023)   Humiliation, Afraid, Rape, and Kick questionnaire    Fear of Current or Ex-Partner: No    Emotionally Abused: No    Physically Abused: No    Sexually Abused: No     Family History  Problem Relation Age of Onset   Diabetes Mother    Hyperlipidemia Sister    Hypertension Sister     ROS: Otherwise negative unless mentioned in HPI  Physical Examination  Vitals:   09/06/23 0915 09/06/23 1200  BP: (!) 134/109   Pulse: 80   Resp: 14   Temp:  98.8 F (37.1 C)  SpO2: 93%    Body mass index is 23.21 kg/m.  General:  WDWN in NAD Gait: Not observed HENT: WNL, normocephalic Pulmonary: normal non-labored breathing, without Rales, rhonchi,  wheezing Cardiac: regular Abdomen:  soft, NT/ND, no masses Skin: without rashes Vascular Exam/Pulses: Palpable pseudoaneurysm in the right groin without tenderness to palpation; palpable right DP pulse Extremities: without ischemic changes, without Gangrene , without cellulitis; without open wounds;  Musculoskeletal: no muscle wasting or atrophy  Neurologic: A&O X 3 right hand grip deficit; right straight leg raise deficit Psychiatric:  The pt has Normal affect. Lymph:  Unremarkable  CBC    Component Value Date/Time   WBC 7.4  09/06/2023 0514   RBC 4.70 09/06/2023 0514   HGB 15.3 09/06/2023 0516   HGB 14.8 02/12/2022 0943   HCT 45.0 09/06/2023 0516   HCT 45.0 02/12/2022 0943   PLT 159 09/06/2023 0514   PLT 174 02/12/2022 0943   MCV  94.7 09/06/2023 0514   MCV 95 02/12/2022 0943   MCH 30.2 09/06/2023 0514   MCHC 31.9 09/06/2023 0514   RDW 15.2 09/06/2023 0514   RDW 15.0 02/12/2022 0943   LYMPHSABS 1.8 09/06/2023 0514   LYMPHSABS 1.5 02/12/2022 0943   MONOABS 0.9 09/06/2023 0514   EOSABS 0.1 09/06/2023 0514   EOSABS 0.1 02/12/2022 0943   BASOSABS 0.0 09/06/2023 0514   BASOSABS 0.0 02/12/2022 0943    BMET    Component Value Date/Time   NA 144 09/06/2023 0516   NA 145 (H) 02/03/2023 0927   K 4.2 09/06/2023 0516   CL 108 09/06/2023 0516   CO2 22 09/06/2023 0514   GLUCOSE 114 (H) 09/06/2023 0516   BUN 26 (H) 09/06/2023 0516   BUN 22 02/03/2023 0927   CREATININE 1.80 (H) 09/06/2023 0516   CREATININE 0.99 11/12/2014 1344   CALCIUM 9.2 09/06/2023 0514   GFRNONAA 41 (L) 09/06/2023 0514   GFRAA 46 (L) 02/23/2020 0421    COAGS: Lab Results  Component Value Date   INR 1.2 09/06/2023   INR 1.2 05/30/2021   INR 1.9 (H) 02/23/2020     Non-Invasive Vascular Imaging:   CTA demonstrating 3 cm anastomotic pseudoaneurysm in the right groin    ASSESSMENT/PLAN: This is a 72 y.o. male admitted to the hospital with hemorrhagic stroke of the left hemisphere  Despite CTA finding of anastomotic pseudoaneurysm in the right groin, the patient is without pain to palpation of the pseudoaneurysm.  His right foot is well-perfused with a palpable DP pulse suggesting bypass patency.  No indication for repair of pseudoaneurysm currently.  Heparin will need to be given intraoperatively which currently the patient would not tolerate with hemorrhagic CVA.  Given that the pseudoaneurysm is nontender on exam this is likely chronic.  Vascular will continue to follow for possible repair in the coming weeks based on  recommendations from neurology.  On-call vascular surgeon Dr. Karin Lieu will evaluate the patient later today and provide further treatment plans.   Emilie Rutter PA-C [facility] [phone]  VASCULAR STAFF ADDENDUM:  I have independently interviewed and examined the patient. I agree with the above.   On exam, Terrail had no complaints and seems to be in good spirits.  He denied pain in the right groin.  No pain on palpation. Bypass duplex on 03/25/2023 did not show pseudoaneurysm, however this was also not looking for pseudoaneurysm. I do not think that the pseudoaneurysm is new, as there is no associated pain.  The lesion is likely chronic. Regardless, Cederick cannot be anticoagulated at this time, therefore the lesion cannot be fixed.  He would benefit from continued rehab, with plans to hopefully fix the lesion within the next month.   I will defer anticoagulation recommendations to neurology.  Recommend heparin drip when able.  As long as Owens can tolerate heparin drip, he would likely tolerate surgery.  I plan to follow Fayrene Fearing closely while inpatient. Can work with physical therapy with no limitations at this time. Should any issues arise new onset pain, expanding mass in the groin, please call vascular surgery immediately.   Victorino Sparrow MD [facility] Phone Number: [phone] 09/06/2023 5:51 PM

## 2023-09-06 NOTE — Procedures (Signed)
Objective Swallowing Evaluation: Type of Study: FEES-Fiberoptic Endoscopic Evaluation of Swallow   Patient Details  Name: Kyle [name] MRN: [id number] Date of Birth: May 18, 1951  Today's Date: 09/06/2023 Time: SLP Start Time (ACUTE ONLY): 1350 -SLP Stop Time (ACUTE ONLY): 1420  SLP Time Calculation (min) (ACUTE ONLY): 30 min   Past Medical History:  Past Medical History:  Diagnosis Date   Aortic insufficiency    MODERATE WITH A BICUSPID AORTIC VAVLE   Arterial occlusive disease    MULTILEVEL   CHF (congestive heart failure) (HCC)    Chronic bronchitis (HCC)    CKD (chronic kidney disease) stage 3, GFR 30-59 ml/min (HCC)    COPD (chronic obstructive pulmonary disease) (HCC)    Dilated cardiomyopathy (HCC)    WITH EJECTION FRACTION DOWN TO 20-25%--WITH CONGESTIVE HEART FAILURE   Edema    LOWER EXTREMETIES   Emphysema of lung (HCC)    Hyperglycemia 01/2020   Hypertension    Normal coronary arteries 2009   Orthopnea    Peripheral arterial disease (HCC)    Pulmonary hypertension (HCC)    Past Surgical History:  Past Surgical History:  Procedure Laterality Date   ABDOMINAL AORTAGRAM N/A 11/05/2014   Procedure: ABDOMINAL Ronny Flurry;  Surgeon: Chuck Hint, MD;  Location: [facility] CATH LAB;  Service: Cardiovascular;  Laterality: N/A;   ABDOMINAL AORTOGRAM W/LOWER EXTREMITY Bilateral 04/21/2019   Procedure: ABDOMINAL AORTOGRAM W/LOWER EXTREMITY;  Surgeon: Chuck Hint, MD;  Location: [facility] INVASIVE CV LAB;  Service: Cardiovascular;  Laterality: Bilateral;   ABDOMINAL AORTOGRAM W/LOWER EXTREMITY Right 05/30/2021   Procedure: ABDOMINAL AORTOGRAM W/LOWER EXTREMITY;  Surgeon: Chuck Hint, MD;  Location: [facility] INVASIVE CV LAB;  Service: Cardiovascular;  Laterality: Right;   AORTA - BILATERAL FEMORAL ARTERY BYPASS GRAFT N/A 12/18/2014   Procedure: AORTOBIFEMORAL BYPASS GRAFT;  Surgeon: Chuck Hint, MD;  Location: [facility] OR;  Service: Vascular;  Laterality: N/A;    CARDIAC CATHETERIZATION  2009   Nl Cors, EF 20%   COLONOSCOPY     ENDARTERECTOMY FEMORAL Left 12/18/2014   Procedure: ENDARTERECTOMY FEMORAL;  Surgeon: Chuck Hint, MD;  Location: [place] OR;  Service: Vascular;  Laterality: Left;   FALSE ANEURYSM REPAIR Right 04/24/2019   Procedure: REPAIR OF RIGHT FEMORAL ARTERY PSEUDOANEURYSM;  Surgeon: Larina Earthly, MD;  Location: [facility] OR;  Service: Vascular;  Laterality: Right;   FEMORAL-POPLITEAL BYPASS GRAFT  02/21/2011   right   FEMORAL-TIBIAL BYPASS GRAFT Right 04/24/2019   Procedure: REDO BYPASS GRAFT RIGHT FEMORAL-TIBIAL ARTERY USING LEFT LEG VEIN & HEMASHIELD GOLD 8mm GRAFT;  Surgeon: Larina Earthly, MD;  Location: [facility] OR;  Service: Vascular;  Laterality: Right;   IR FLUORO GUIDE CV LINE RIGHT  12/28/2019   IR REMOVAL TUN CV CATH W/O FL  01/11/2020   IR US GUIDE VASC ACCESS RIGHT  12/28/2019   OTHER SURGICAL HISTORY     POST RIGHT FEMOROPOPLITEAL BYPASS GRAFT   PERIPHERAL VASCULAR CATHETERIZATION  11/05/2014   Procedure: LOWER EXTREMITY ANGIOGRAPHY;  Surgeon: Chuck Hint, MD;  Location: [facility] CATH LAB;  Service: Cardiovascular;;   VEIN HARVEST Left 04/24/2019   Procedure: LEFT LEG SAPHENOUS VEIN HARVEST;  Surgeon: Larina Earthly, MD;  Location: [facility] OR;  Service: Vascular;  Laterality: Left;   HPI: Kyle [name] is a 72 y.o. male with hx of Dilated cardiomyopathy, COPD on oxygen at baseline, PAD, Afibb on eliquis and took his last dose last night who went to sleep at 1900 and woke up and fell on the  floor as soon as he tried to get up. Wife called EMS. Noted to have elevated BP to 210 systolic. He was brought in as a code stroke for concern for potential ICH.  MRI Brain revealed "Unchanged acute hemorrhage centered within the left lentiform nuclei  with mild surrounding edema and mild mass effect on the left lateral  ventricle.".   Subjective: Pt seen for FEES.  Transnasal endoscope passed through R naris without incidence. Pt tolerated the procedure  well. Pharyngeal and laryngeal anatomy observed to be [facility]. Vocal cord adduction appeared WFL during voicing.   Pt presents with a moderate oral and mild pharyngeal dysphagia per FEES completed today.   Oral deficits characterized by reduced labial strength and coordination on R side resulting in significant anterior spillage of liquids presented by cup or spoon. Pt appeared to achieve successful oral clearance of solids. No significant posterior spillage of trials observed.   Pharyngeal deficits characterized by reduced hyolaryngeal elevation/excursion (judged by incomplete "white out" phase), reduced base of tongue retraction, reduced pharyngeal stripping, and reduced laryngeal vestibule closure.   Findings -There was audible aspiration of thin liquid residue following straw sip. Aspiration occurred after the swallow from thin liquid residue in the pyriform sinuses and interarytenoid space that entered the airway post swallow. Pt exhibited an immediate cough response, though unable to determine if aspiration was completely ejected from airway.  -There was x1 instance of trace, transient, deep penetration of suspected nectar-thick liquid residue before swallow of soft cracker trial. Pt ejected residue  with spontaneous throat clear + swallow.  -No penetration or aspiration of applesauce, soft solid, or regular solid observed.  -There was min-mod vallecular and pyriform sinus residue following all trials. Residue appeared to increase as the viscosity of the trial increased. Pt did not appear sensate to pharyngeal residue.  He was able to reduce residue with prompted dry swallow or liquid wash.    Treatment: Following study, SLP reviewed FEES findings and recommendations in detail. Reviewed compensatory swallow strategies and reasoning for modified diet at this time. Pt verbalized good understanding and in agreement with plan.    Recommend a full liquids, nectar-thick liquids diet.  Recommend PO meds  whole in applesauce or pudding. Recommend pt use straws to compensatory for oral deficits. SLP may be able to advance liquids and solids with ongoing clinical swallow assessment at bedside given pt's response to penetration/aspiration events were audible; however, pt did not appear sensate to pharyngeal residue. Recommend close diet tolerance monitoring.   Compensatory swallow strategies: swallow x2 with each bite or sip, follow solids with a sip of liquids, small bites and sips.     Recommendations for follow up therapy are one component of a multi-disciplinary discharge planning process, led by the attending physician.  Recommendations may be updated based on patient status, additional functional criteria and insurance authorization.  Assessment / Plan / Recommendation     09/06/2023    3:52 PM  Clinical Impressions  SLP Visit Diagnosis Dysphagia, oropharyngeal phase (R13.12)  Impact on safety and function Mild aspiration risk         09/06/2023    3:52 PM  Treatment Recommendations  Treatment Recommendations Therapy as outlined in treatment plan below        09/06/2023    3:54 PM  Prognosis  Prognosis for improved oropharyngeal function Good       09/06/2023    3:52 PM  Diet Recommendations  SLP Diet Recommendations Nectar thick liquid  Liquid Administration via  Straw  Medication Administration Whole meds with puree  Compensations Slow rate;Small sips/bites;Multiple dry swallows after each bite/sip;Follow solids with liquid  Postural Changes Remain semi-upright after after feeds/meals (Comment)         09/06/2023    3:52 PM  Other Recommendations  Oral Care Recommendations Oral care BID  Caregiver Recommendations Avoid jello, ice cream, thin soups, popsicles;Remove water pitcher  Follow Up Recommendations Acute inpatient rehab (3hours/day)  Functional Status Assessment Patient has had a recent decline in their functional status and demonstrates the ability to make  significant improvements in function in a reasonable and predictable amount of time.       09/06/2023    3:52 PM  Frequency and Duration   Speech Therapy Frequency (ACUTE ONLY) min 1 x/week  Treatment Duration 1 week         09/06/2023    3:46 PM  Oral Phase  Oral Phase Impaired  Oral - Thin Teaspoon Right anterior bolus loss  Oral - Thin Cup Right anterior bolus loss  Oral Phase - Comment improved oral control with straw use       09/06/2023    3:47 PM  Pharyngeal Phase  Pharyngeal Phase Impaired  Pharyngeal- Nectar Teaspoon WFL  Pharyngeal Material does not enter airway  Pharyngeal --  Pharyngeal- Nectar Straw Reduced tongue base retraction;Reduced laryngeal elevation;Reduced pharyngeal peristalsis  Pharyngeal Material enters airway, CONTACTS cords and then ejected out  Pharyngeal- Thin Teaspoon Reduced pharyngeal peristalsis;Reduced tongue base retraction  Pharyngeal Material does not enter airway  Pharyngeal Material does not enter airway  Pharyngeal- Thin Straw Trace aspiration;Penetration/Apiration after swallow;Penetration/Aspiration before swallow;Reduced laryngeal elevation;Reduced airway/laryngeal closure  Pharyngeal Material enters airway, passes BELOW cords and not ejected out despite cough attempt by patient  Pharyngeal- Puree Reduced pharyngeal peristalsis;Reduced tongue base retraction;Reduced laryngeal elevation;Pharyngeal residue - valleculae  Pharyngeal Material does not enter airway  Pharyngeal- Mechanical Soft Reduced tongue base retraction;Reduced pharyngeal peristalsis;Reduced laryngeal elevation;Pharyngeal residue - valleculae  Pharyngeal Material does not enter airway  Pharyngeal Material does not enter airway  Pharyngeal- Pill NT      Ellery Plunk 09/06/2023, 3:55 PM

## 2023-09-07 DIAGNOSIS — I610 Nontraumatic intracerebral hemorrhage in hemisphere, subcortical: Secondary | ICD-10-CM

## 2023-09-07 MED ORDER — LEVETIRACETAM 500 MG PO TABS
500.0000 mg | ORAL_TABLET | Freq: Two times a day (BID) | ORAL | Status: DC
  Administered 2023-09-07 – 2023-09-10 (×7): 500 mg via ORAL
  Filled 2023-09-07 (×7): qty 1

## 2023-09-07 MED ORDER — FUROSEMIDE 10 MG/ML IJ SOLN
60.0000 mg | Freq: Once | INTRAMUSCULAR | Status: AC
  Administered 2023-09-07: 60 mg via INTRAVENOUS
  Filled 2023-09-07: qty 6

## 2023-09-07 MED ORDER — HYDRALAZINE HCL 50 MG PO TABS
100.0000 mg | ORAL_TABLET | Freq: Three times a day (TID) | ORAL | Status: DC
  Administered 2023-09-07 – 2023-09-10 (×10): 100 mg via ORAL
  Filled 2023-09-07 (×11): qty 2

## 2023-09-07 MED ORDER — CLONIDINE HCL 0.1 MG PO TABS
0.1000 mg | ORAL_TABLET | Freq: Three times a day (TID) | ORAL | Status: DC
  Administered 2023-09-07 (×3): 0.1 mg via ORAL
  Filled 2023-09-07 (×4): qty 1

## 2023-09-07 MED ORDER — IPRATROPIUM-ALBUTEROL 0.5-2.5 (3) MG/3ML IN SOLN
3.0000 mL | Freq: Four times a day (QID) | RESPIRATORY_TRACT | Status: DC
  Administered 2023-09-07 – 2023-09-08 (×5): 3 mL via RESPIRATORY_TRACT
  Filled 2023-09-07 (×5): qty 3

## 2023-09-07 NOTE — Plan of Care (Signed)
  Problem: Education: Goal: Knowledge of disease or condition will improve Outcome: Progressing Goal: Knowledge of secondary prevention will improve (MUST DOCUMENT ALL) Outcome: Progressing Goal: Knowledge of patient specific risk factors will improve (Mark N/A or DELETE if not current risk factor) Outcome: Progressing   Problem: Intracerebral Hemorrhage Tissue Perfusion: Goal: Complications of Intracerebral Hemorrhage will be minimized Outcome: Progressing   Problem: Coping: Goal: Will verbalize positive feelings about self Outcome: Progressing Goal: Will identify appropriate support needs Outcome: Progressing   Problem: Health Behavior/Discharge Planning: Goal: Ability to manage health-related needs will improve Outcome: Progressing Goal: Goals will be collaboratively established with patient/family Outcome: Progressing   Problem: Self-Care: Goal: Ability to participate in self-care as condition permits will improve Outcome: Progressing Goal: Verbalization of feelings and concerns over difficulty with self-care will improve Outcome: Progressing Goal: Ability to communicate needs accurately will improve Outcome: Progressing   Problem: Nutrition: Goal: Risk of aspiration will decrease Outcome: Progressing Goal: Dietary intake will improve Outcome: Progressing   

## 2023-09-07 NOTE — Evaluation (Signed)
Occupational Therapy Evaluation Patient Details Name: Kyle [name] MRN: [id number] DOB: 04-Jul-1951 Today's Date: 09/07/2023   History of Present Illness 72 yo male presents to [facility] on 11/11 with R sided weakness, SBP 200s. CTH shows L thalamic ICH. Pt also with large R groin pseudoaneurysm, vascular surgery following at a distance. PMH includes dilated cardiomyopathy, HF, COPD on 4.5L baseline, HTN, PAD, hx of fem pop bypass 2010, aortobifemoral bypass 2016.   Clinical Impression   PTA pt lives independently with his wife, is active and enjoys watching sports. Pt seen on 10L HFNC with VSS during session. Increased WOB however SpO2 rebounds to 90 with pursed lip breathing. Pt states his breathing feels "much better sitting up". Pt demonstrates a significant functional decline, requiring max A +2 for mobility with HHA and max to total A for ADL tasks due to below listed deficits. Patient will benefit from intensive inpatient follow up therapy, >3 hours/day. Acute OT to follow. Pt very appreciative.  Please keep RUE supported on 2 pillows at all times. Recommend nurssing mobilize with [facility] and +2 assist.       If plan is discharge home, recommend the following: Two people to help with walking and/or transfers;Two people to help with bathing/dressing/bathroom;Assistance with cooking/housework;Assistance with feeding;Direct supervision/assist for medications management;Direct supervision/assist for financial management;Assist for transportation;Help with stairs or ramp for entrance    Functional Status Assessment  Patient has had a recent decline in their functional status and demonstrates the ability to make significant improvements in function in a reasonable and predictable amount of time.  Equipment Recommendations  BSC/3in1    Recommendations for Other Services Rehab consult     Precautions / Restrictions Precautions Precautions: Fall Precaution Comments: R  hemiplegia Restrictions Weight Bearing Restrictions: No      Mobility Bed Mobility Overal bed mobility: Needs Assistance Bed Mobility: Supine to Sit     Supine to sit: Max assist, +2 for physical assistance     General bed mobility comments: assist for trunk elevation and righting, scooting to EOB. LLE hooked under RLE to move LEs to EOB, step-by-step sequencing cues for bed mobility.    Transfers Overall transfer level: Needs assistance Equipment used: 2 person hand held assist Transfers: Sit to/from Stand, Bed to chair/wheelchair/BSC Sit to Stand: +2 physical assistance, Max assist Stand pivot transfers: Max assist, +2 physical assistance         General transfer comment: assist for rise, RLE blocking, steadying, correcting R lateral bias. RLE buckling mid-transfer with OT blocking and OT-corrected, assist for safe lower into recliner.      Balance Overall balance assessment: Needs assistance Sitting-balance support: Single extremity supported, Feet supported Sitting balance-Leahy Scale: Poor Sitting balance - Comments: at least min truncal assist to maintain balance, R lateral bias with min LUE pushing towards R (noted when pt's L hand is in lap, on bedrail, or on EOB) Postural control: Right lateral lean, Posterior lean Standing balance support: Bilateral upper extremity supported Standing balance-Leahy Scale: Zero Standing balance comment: max +2                           ADL either performed or assessed with clinical judgement   ADL Overall ADL's : Needs assistance/impaired Eating/Feeding: Minimal assistance Eating/Feeding Details (indicate cue type and reason): thickecned liquids; increased time and cuing for precautions; using non-dominant L hand Grooming: Minimal assistance Grooming Details (indicate cue type and reason): able to brush teeth with [place] Upper Body  Bathing: Moderate assistance   Lower Body Bathing: Maximal assistance;Sit to/from  stand   Upper Body Dressing : Maximal assistance   Lower Body Dressing: Total assistance;Sit to/from stand   Toilet Transfer: Maximal assistance;+2 for safety/equipment   Toileting- Clothing Manipulation and Hygiene: Total assistance       Functional mobility during ADLs: Maximal assistance;+2 for physical assistance       Vision Patient Visual Report: No change from baseline Additional Comments: will further assess; identifying targetsin all quadrants     Perception Perception: Impaired Preception Impairment Details: Spatial orientation     Praxis Praxis: Impaired Praxis Impairment Details: Motor planning Praxis-Other Comments: had difficulty planning how to use non-dominanat hand to self feed; improved with repetition   Pertinent Vitals/Pain Pain Assessment Pain Assessment: No/denies pain     Extremity/Trunk Assessment Upper Extremity Assessment Upper Extremity Assessment: Right hand dominant;RUE deficits/detail RUE Deficits / Details: non-functional; no active movement noted; Brunstrom 1; ? flexor tone; +clonus RUE Sensation: decreased light touch RUE Coordination: decreased fine motor;decreased gross motor   Lower Extremity Assessment Lower Extremity Assessment: Defer to PT evaluation RLE Deficits / Details: 1/5 hip flexion, knee extension. otherwise 0/5 RLE Sensation:  (pt denies altered sensation)   Cervical / Trunk Assessment Cervical / Trunk Assessment: Other exceptions (R lateral bias at times; posterior preference)   Communication Communication Communication: Difficulty communicating thoughts/reduced clarity of speech Cueing Techniques: Tactile cues;Visual cues   Cognition Arousal: Alert Behavior During Therapy: WFL for tasks assessed/performed Overall Cognitive Status: Impaired/Different from baseline Area of Impairment: Safety/judgement, Awareness, Memory                         Safety/Judgement: Decreased awareness of safety Awareness:  Emergent   General Comments: A&Ox4; unaware of pushing at times; will further assess     General Comments  VSS    Exercises     Shoulder Instructions      Home Living Family/patient expects to be discharged to:: Private residence Living Arrangements: Spouse/significant other Available Help at Discharge: Family Type of Home: Apartment Home Access: Stairs to enter Secretary/administrator of Steps: 15 Entrance Stairs-Rails: Right;Left;Can reach both Home Layout: One level     Bathroom Shower/Tub: Chief Strategy Officer: Standard Bathroom Accessibility: Yes   Home Equipment: None      Lives With: Spouse    Prior Functioning/Environment Prior Level of Function : Independent/Modified Independent                        OT Problem List: Decreased strength;Decreased range of motion;Decreased activity tolerance;Impaired balance (sitting and/or standing);Impaired vision/perception;Decreased coordination;Decreased knowledge of use of DME or AE;Decreased cognition;Decreased safety awareness;Cardiopulmonary status limiting activity;Impaired sensation;Impaired tone;Impaired UE functional use      OT Treatment/Interventions: Self-care/ADL training;Therapeutic exercise;Neuromuscular education;DME and/or AE instruction;Energy conservation;Therapeutic activities;Cognitive remediation/compensation;Balance training;Patient/family education;Visual/perceptual remediation/compensation    OT Goals(Current goals can be found in the care plan section) Acute Rehab OT Goals Patient Stated Goal: to get better OT Goal Formulation: With patient Time For Goal Achievement: 09/21/23 Potential to Achieve Goals: Good  OT Frequency: Min 1X/week    Co-evaluation PT/OT/SLP Co-Evaluation/Treatment: Yes Reason for Co-Treatment: For patient/therapist safety;To address functional/ADL transfers;Complexity of the patient's impairments (multi-system involvement) PT goals addressed during  session: Mobility/safety with mobility;Balance OT goals addressed during session: ADL's and self-care      AM-PAC OT "6 Clicks" Daily Activity     Outcome Measure Help from another person eating  meals?: A Little Help from another person taking care of personal grooming?: A Little Help from another person toileting, which includes using toliet, bedpan, or urinal?: Total Help from another person bathing (including washing, rinsing, drying)?: A Lot Help from another person to put on and taking off regular upper body clothing?: A Lot Help from another person to put on and taking off regular lower body clothing?: Total 6 Click Score: 12   End of Session Equipment Utilized During Treatment: Gait belt Nurse Communication: Mobility status;Need for lift equipment (use Stedy +2 people)  Activity Tolerance: Patient tolerated treatment well Patient left: in chair;with call bell/phone within reach;with chair alarm set  OT Visit Diagnosis: Unsteadiness on feet (R26.81);Other abnormalities of gait and mobility (R26.89);Muscle weakness (generalized) (M62.81);Apraxia (R48.2);Other symptoms and signs involving the nervous system (R29.898);Hemiplegia and hemiparesis Hemiplegia - Right/Left: Right Hemiplegia - dominant/non-dominant: Dominant Hemiplegia - 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

## 2023-09-07 NOTE — Progress Notes (Signed)
Speech Language Pathology Treatment: Dysphagia;Cognitive-Linquistic  Patient Details Name: EDILBERTO JOBSON MRN: [id number] DOB: 16-May-1951 Today's Date: 09/07/2023 Time: 1010-1030 SLP Time Calculation (min) (ACUTE ONLY): 20 min  Assessment / Plan / Recommendation Clinical Impression  Patient seen by SLP for skilled intervention focused on dysphagia and speech/voice goals. Patient was awake, alert and agreeable to session. RN in room and she reported he ate and drank items on breakfast meal tray, not exhibiting any overt difficulties. SLP observed patient with nectar thick juice via straw sips. Delayed swallow initiation but no overt s/s aspiration. After several sips, patient stated "that's enough".  SLP then addressed patient's speech and voice impairment. He was very receptive to treatment and able to return demonstrate very well with minimal cueing. SLP modeled controlled breathing through nose and controlled breathing out through mouth (not able to achieve adequate bilabial movement for pursed lip breathing) with exhalation duration being 2.5-3 seconds. We then worked on continuous phonation followed by strategies for improving overall speech intelligibility. When patient speaking slowly and chunking one word at a time, SLP was able to understand him significantly better. Because of his poor respiratory support, he tends to talk faster and as he also has dysarthria, this leads to speech intelligibility that is less than 40%. At one point patient asked, "will. My. Speech. Get. Better?". SLP encouraged patient to follow strategies we worked on and that his speech will improve beyond current level. SLP will continue to follow patient for speech and swallow function goals.     HPI HPI: NICHLOUS HORNUNG is a 72 y.o. male with hx of Dilated cardiomyopathy, COPD on oxygen at baseline, PAD, Afibb on eliquis and took his last dose last night who went to sleep at 1900 and woke up and fell on the floor as soon as  he tried to get up. Wife called EMS. Noted to have elevated BP to 210 systolic. He was brought in as a code stroke for concern for potential ICH.  MRI Brain revealed "Unchanged acute hemorrhage centered within the left lentiform nuclei  with mild surrounding edema and mild mass effect on the left lateral  ventricle." FEES swallow assessment completed on 09/06/23 and recommendation for initiation of full liquids/nectar thick.      SLP Plan  Continue with current plan of care      Recommendations for follow up therapy are one component of a multi-disciplinary discharge planning process, led by the attending physician.  Recommendations may be updated based on patient status, additional functional criteria and insurance authorization.    Recommendations  Diet recommendations: Nectar-thick liquid;Other(comment) (full liquids) Liquids provided via: Straw Medication Administration: Crushed with puree Supervision: Staff to assist with self feeding;Full supervision/cueing for compensatory strategies Compensations: Slow rate;Small sips/bites;Multiple dry swallows after each bite/sip;Follow solids with liquid Postural Changes and/or Swallow Maneuvers: Seated upright 90 degrees                  Oral care QID   Frequent or constant Supervision/Assistance Dysphagia, oropharyngeal phase (R13.12);Dysarthria and anarthria (R47.1)     Continue with current plan of care   Angela Nevin, MA, CCC-SLP Speech Therapy

## 2023-09-07 NOTE — Progress Notes (Signed)
  Daily Progress Note  S/p: Hemorrhagic stroke  Subjective: No complaints   Objective: Vitals:   09/07/23 0825 09/07/23 0830  BP:  (!) 125/58  Pulse: 70 66  Resp: (!) 23 17  Temp:    SpO2: 90% 90%    Physical Examination  Comfortable  Nonlabored breathing  Regular rate Word finding difficulties  Right groin with palpable fem    ASSESSMENT/PLAN:  Overall, Kyle [name] appears to be doing well. 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  Several will continue to follow, but I have no restrictions at this time, should new onset pain or expanding hematoma occur, please call.  Will continue to follow peripherally.  My plan is to see him in the office in 1 month's time with new imaging to assess the lesion.  He is aware that the lesion cannot be repaired safely at this time due to recent head bleed.   Victorino Sparrow MD [facility] [phone] 09/07/2023  9:53 AM

## 2023-09-07 NOTE — Progress Notes (Addendum)
STROKE TEAM PROGRESS NOTE   BRIEF HPI Kyle [name] is a 72 y.o. male with history of dilated cardiomyopathy, chronic respiratory failure on 4.5L baseline, hx of fem pop bypass in 2010, an aortobifemoral bypass in 2016 presenting with right sided weakness. CT showed a left thalamic hemorrhage. He was given Kcentra for eliquis reversal in the ED. CT surgery consulted and recommend BP control less than 140 for aortic management. Also found to have a large right pseudoaneurysm and VVS has been consulted. Plan for intervention during this hospitalization.    SIGNIFICANT HOSPITAL EVENTS 11/10- admit to ICU due to ICH 11/11- VVS consult for pseudoaneurysm   INTERIM HISTORY/SUBJECTIVE Kyle [name] for BP control  Add nebs scheduled, up to 12L [place]  Increase hydralazine to 100mg  TID, Norvasc 10mg , and clonidine 0.1 TID  One time dose of 60mg  IV Lasix  OBJECTIVE  CBC    Component Value Date/Time   WBC 7.4 09/06/2023 0514   RBC 4.70 09/06/2023 0514   HGB 15.3 09/06/2023 0516   HGB 14.8 02/12/2022 0943   HCT 45.0 09/06/2023 0516   HCT 45.0 02/12/2022 0943   PLT 159 09/06/2023 0514   PLT 174 02/12/2022 0943   MCV 94.7 09/06/2023 0514   MCV 95 02/12/2022 0943   MCH 30.2 09/06/2023 0514   MCHC 31.9 09/06/2023 0514   RDW 15.2 09/06/2023 0514   RDW 15.0 02/12/2022 0943   LYMPHSABS 1.8 09/06/2023 0514   LYMPHSABS 1.5 02/12/2022 0943   MONOABS 0.9 09/06/2023 0514   EOSABS 0.1 09/06/2023 0514   EOSABS 0.1 02/12/2022 0943   BASOSABS 0.0 09/06/2023 0514   BASOSABS 0.0 02/12/2022 0943    BMET    Component Value Date/Time   NA 144 09/06/2023 0516   NA 145 (H) 02/03/2023 0927   K 4.2 09/06/2023 0516   CL 108 09/06/2023 0516   CO2 22 09/06/2023 0514   GLUCOSE 114 (H) 09/06/2023 0516   BUN 26 (H) 09/06/2023 0516   BUN 22 02/03/2023 0927   CREATININE 1.80 (H) 09/06/2023 0516   CREATININE 0.99 11/12/2014 1344   CALCIUM 9.2 09/06/2023 0514   EGFR 43 (L) 02/03/2023 0927   GFRNONAA 41 (L)  09/06/2023 0514    IMAGING past 24 hours DG CHEST PORT 1 VIEW  Result Date: 09/06/2023 CLINICAL DATA:  Respiratory abnormality EXAM: PORTABLE CHEST 1 VIEW COMPARISON:  X-ray 04/10/2021.  CT 09/06/2023 earlier. FINDINGS: Enlarged cardiopericardial silhouette. Vascular congestion. Chronic lung changes. No pneumothorax, effusion or consolidation. Slight thickening along the minor fissure. IMPRESSION: Enlarged cardiac silhouette.  Vascular congestion. Electronically Signed   By: Karen Kays M.D.   On: 09/06/2023 19:19   ECHOCARDIOGRAM COMPLETE  Result Date: 09/06/2023    ECHOCARDIOGRAM REPORT   Patient Name:   Kyle [name] Date of Exam: 09/06/2023 Medical Rec #:  [id number]      Height:       71.0 in Accession #:    [id number]     Weight:       166.4 lb Date of Birth:  Mar 02, 1951      BSA:          1.950 m Patient Age:    72 years       BP:           134/109 mmHg Patient Gender: M              HR:           75 bpm. Exam Location:  Inpatient Procedure: 2D  Echo, 3D Echo, Cardiac Doppler, Color Doppler and Strain Analysis Indications:    Stroke  History:        Patient has prior history of Echocardiogram examinations, most                 recent 04/05/2023. Risk Factors:Diabetes, Dyslipidemia and                 Hypertension.  Sonographer:    Karma Ganja Referring Phys: [id number] Marvel Plan  Sonographer Comments: Global longitudinal strain was attempted. IMPRESSIONS  1. Left ventricular ejection fraction, by estimation, is 55 to 60%. The left ventricle has normal function. The left ventricle has no regional wall motion abnormalities. There is moderate concentric left ventricular hypertrophy. Left ventricular diastolic parameters are consistent with Grade I diastolic dysfunction (impaired relaxation). The average left ventricular global longitudinal strain is -15.5 %. The global longitudinal strain is abnormal.  2. Right ventricular systolic function is normal. The right ventricular size is normal. There is  normal pulmonary artery systolic pressure. The estimated right ventricular systolic pressure is 35.6 mmHg.  3. Left atrial size was mildly dilated.  4. Right atrial size was mildly dilated.  5. The mitral valve is normal in structure. No evidence of mitral valve regurgitation. No evidence of mitral stenosis.  6. The aortic valve is tricuspid. There is severe calcifcation of the aortic valve. Aortic valve regurgitation is mild. Severe aortic valve stenosis. Aortic valve mean gradient measures 36.0 mmHg, AVA 0.95 cm^2.  7. The inferior vena cava is dilated in size with <50% respiratory variability, suggesting right atrial pressure of 15 mmHg. FINDINGS  Left Ventricle: Left ventricular ejection fraction, by estimation, is 55 to 60%. The left ventricle has normal function. The left ventricle has no regional wall motion abnormalities. The average left ventricular global longitudinal strain is -15.5 %. The global longitudinal strain is abnormal. The left ventricular internal cavity size was normal in size. There is moderate concentric left ventricular hypertrophy. Left ventricular diastolic parameters are consistent with Grade I diastolic dysfunction (impaired relaxation). Right Ventricle: The right ventricular size is normal. No increase in right ventricular wall thickness. Right ventricular systolic function is normal. There is normal pulmonary artery systolic pressure. The tricuspid regurgitant velocity is 2.27 m/s, and  with an assumed right atrial pressure of 15 mmHg, the estimated right ventricular systolic pressure is 35.6 mmHg. Left Atrium: Left atrial size was mildly dilated. Right Atrium: Right atrial size was mildly dilated. Pericardium: There is no evidence of pericardial effusion. Mitral Valve: The mitral valve is normal in structure. There is mild calcification of the mitral valve leaflet(s). Mild mitral annular calcification. No evidence of mitral valve regurgitation. No evidence of mitral valve stenosis.  Tricuspid Valve: The tricuspid valve is normal in structure. Tricuspid valve regurgitation is trivial. Aortic Valve: The aortic valve is tricuspid. There is severe calcifcation of the aortic valve. Aortic valve regurgitation is mild. Aortic regurgitation PHT measures 509 msec. Severe aortic stenosis is present. Aortic valve mean gradient measures 36.0 mmHg. Aortic valve peak gradient measures 59.7 mmHg. Aortic valve area, by VTI measures 1.03 cm. Pulmonic Valve: The pulmonic valve was normal in structure. Pulmonic valve regurgitation is not visualized. Aorta: The aortic root is normal in size and structure. Venous: The inferior vena cava is dilated in size with less than 50% respiratory variability, suggesting right atrial pressure of 15 mmHg. IAS/Shunts: No atrial level shunt detected by color flow Doppler.  LEFT VENTRICLE PLAX 2D LVIDd:  4.70 cm   Diastology LVIDs:         2.70 cm   LV e' medial:    5.55 cm/s LV PW:         1.40 cm   LV E/e' medial:  12.9 LV IVS:        1.20 cm   LV e' lateral:   8.05 cm/s LVOT diam:     2.00 cm   LV E/e' lateral: 8.9 LV SV:         76 LV SV Index:   39        2D Longitudinal Strain LVOT Area:     3.14 cm  2D Strain GLS Avg:     -15.5 %                           3D Volume EF:                          3D EF:        50 %                          LV EDV:       172 ml                          LV ESV:       86 ml                          LV SV:        87 ml RIGHT VENTRICLE             IVC RV Basal diam:  4.60 cm     IVC diam: 2.20 cm RV S prime:     16.20 cm/s TAPSE (M-mode): 3.2 cm LEFT ATRIUM             Index        RIGHT ATRIUM           Index LA diam:        3.60 cm 1.85 cm/m   RA Area:     23.40 cm LA Vol (A2C):   75.6 ml 38.76 ml/m  RA Volume:   73.60 ml  37.74 ml/m LA Vol (A4C):   56.3 ml 28.87 ml/m LA Biplane Vol: 67.3 ml 34.51 ml/m  AORTIC VALVE AV Area (Vmax):    0.98 cm AV Area (Vmean):   0.96 cm AV Area (VTI):     1.03 cm AV Vmax:           386.33 cm/s AV  Vmean:          271.667 cm/s AV VTI:            0.738 m AV Peak Grad:      59.7 mmHg AV Mean Grad:      36.0 mmHg LVOT Vmax:         120.00 cm/s LVOT Vmean:        83.200 cm/s LVOT VTI:          0.242 m LVOT/AV VTI ratio: 0.33 AI PHT:            509 msec  AORTA Ao Root diam: 3.40 cm Ao Asc diam:  3.20 cm MITRAL VALVE  TRICUSPID VALVE MV Area (PHT): 3.99 cm     TR Peak grad:   20.6 mmHg MV Decel Time: 190 msec     TR Vmax:        227.00 cm/s MV E velocity: 71.70 cm/s MV A velocity: 108.00 cm/s  SHUNTS MV E/A ratio:  0.66         Systemic VTI:  0.24 m                             Systemic Diam: 2.00 cm Dalton McleanMD Electronically signed by Wilfred Lacy Signature Date/Time: 09/06/2023/4:34:36 PM    Final    MR BRAIN WO CONTRAST  Result Date: 09/06/2023 CLINICAL DATA:  Neuro deficit, acute, stroke suspected. EXAM: MRI HEAD WITHOUT CONTRAST TECHNIQUE: Multiplanar, multiecho pulse sequences of the brain and surrounding structures were obtained without intravenous contrast. COMPARISON:  Head CT and CTA head/neck 09/06/2023. MRI brain 12/24/2019. FINDINGS: Brain: Unchanged acute hemorrhage centered within the left lentiform nuclei with mild surrounding edema and mild mass effect on the left lateral ventricle. Background of mild-to-moderate chronic small-vessel disease. No hydrocephalus or extra-axial collection. Vascular: Normal flow voids. Skull and upper cervical spine: Normal marrow signal. Sinuses/Orbits: No acute findings. Other: None. IMPRESSION: Unchanged acute hemorrhage centered within the left lentiform nuclei with mild surrounding edema and mild mass effect on the left lateral ventricle. Electronically Signed   By: Orvan Falconer M.D.   On: 09/06/2023 09:13    Vitals:   09/07/23 0700 09/07/23 0730 09/07/23 0759 09/07/23 0800  BP: (!) 135/53 (!) 140/61  (!) 140/65  Pulse: 80 74  74  Resp: (!) 25 17  19   Temp:   98.4 F (36.9 C)   TempSrc:   Axillary   SpO2: 90% 93%  92%   Weight:      Height:         PHYSICAL EXAM General:  Alert, well-nourished, well-developed patient in no acute distress Psych:  Mood and affect appropriate for situation CV: Regular rate and rhythm on monitor Respiratory:  Regular, coarse, unlabored respirations 12L, [place] GI: Abdomen soft and nontender  NEURO:  Mental Status: AA&Ox3, patient is able to give clear and coherent history Speech/Language: speech is dysarthric  Cranial Nerves:  II: PERRL. Visual fields full.  III, IV, VI: EOMI. Eyelids elevate symmetrically.  V: Sensation is intact to light touch and symmetrical to face.  VII: Right facial droop VIII: hearing intact to voice. IX, X: Palate elevates symmetrically. Phonation is normal.  BJ:YNWGNFAO shrug 5/5. XII: tongue is midline without fasciculations. Motor:  RUE- 1/5, muscle contraction without movement RLE - 2/5 LUE and LLE with 5/5 movemet No movement distal with toes Tone: is normal and bulk is normal Sensation- Intact to light touch bilaterally. Extinction absent to light touch to DSS.   Coordination: FTN intact bilaterally, HKS: no ataxia in BLE.No drift.  Gait- deferred  ASSESSMENT/PLAN  ICH:  left BG ICH s/p kcentra, etiology: Likely uncontrolled HTN in the setting of Eliquis use Code Stroke CT head  Positive for Acute 7 mL hemorrhage in the left lentiform nuclei.   CTA head & neck No CTA spot sign or vascular malformation at the left lentiform hemorrhage MRI  Unchanged acute hemorrhage centered within the left lentiform nuclei with mild surrounding edema and mild mass effect on the left lateral ventricle. 2D Echo EF 55 to 60% LDL 61 HgbA1c 6.2 UDS negative VTE prophylaxis - SCDs Eliquis (apixaban) daily  prior to admission, now on no anticoagulation due to ICH  Therapy recommendations:  Pending Disposition:  Pending   Large right groin pseudoaneurysm  CTA Chest/Abd/Pel- status post aorto bi-iliac bypass graft. Both limbs of the graft are widely  patent. 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  VVS consulted, likely chronic, plan to repair in 1 month as outpatient. No pain in right groin, pulses palpable  BP goal less than 140  Aortic atherosclerosis CTA neck  - bulky atherosclerotic soft plaque with multiple ulcerations, appearance suspicious for developing intramural hematoma, impending dissection  CTA Chest/Abd/Pel- Extensive aortic atherosclerosis with multiple penetrating ulcers and ulcerated plaques. 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 CTS consulted, no indication for intervention BP goal less than 140  Atrial fibrillation Home Meds: eliquis  Reversed with ? Kcentra Hold off AC at this moment Consider to resume AC in 2 weeks if neuro stable  Hypertension Congestive heart failure  Dilated cardiomyopathy Home meds:  Norvasc, coreg, imdur, hydralazine, lasix BP unstable On Kyle [name] IV, taper off as able PRN labetalol and hydralazine Now on amlodipine 10, coreg 6.25 bid, imdur 30, hydralazine 25->100 tid, lasix 20 Add clonidine 0.1 mg 3 times daily Blood Pressure Goal: SBP less than 140  Hyperlipidemia Home meds: crestor 10mg  LDL 61 Resume home Crestor 10 No high intensity statin given LDL at goal Continue statin on discharge  Dysphagia Patient has post-stroke dysphagia, SLP consulted On full liquid diet with nectar thick Advance diet as tolerated  Other Active Problems Hx of seizure- continue keppra 500mg  BID COPD on 4.5L [place] at baseline Now on 7L Follows with pulmonology outpatient  CXR vascular congestion On lasix 20mg  daily  IV 60mg  Lasix once  Hospital day # 1   Patient seen and examined by NP/APP with MD. MD to update note as needed.   Elmer Picker, DNP, FNP-BC [facility] Pager: [phone]  ATTENDING NOTE: I reviewed above note and agree with the assessment and plan. Pt was seen and examined.   Wife at the bedside.  Patient lying in bed, had mild respiratory distress, status post neb and IV Lasix, seems improved.  Still has severe dysarthria and right hemiplegia.  Still on high-dose Kyle [name], resume home meds and increase hydralazine and add clonidine today.  Try to taper off Kyle [name].  Vascular surgery on board, plan for outpatient pseudoaneurysm repair in 1 months.  Okay with PT and OT.  Continue statin.  On full liquid diet, avoid aspiration.  For detailed assessment and plan, please refer to above/below as I have made changes wherever appropriate.   Marvel Plan, MD PhD Stroke Neurology 09/07/2023 3:35 PM  This patient is critically ill due to ICH, severe aortic atherosclerosis, right groin pseudoaneurysm, uncontrolled hypertension and at significant risk of neurological worsening, death form hematoma expansion, cerebral edema, aortic dissection, hypertensive encephalopathy. This patient's care requires constant monitoring of vital signs, hemodynamics, respiratory and cardiac monitoring, review of multiple databases, neurological assessment, discussion with family, other specialists and medical decision making of high complexity. I spent 40 minutes of neurocritical care time in the care of this patient. I had long discussion with wife at bedside, updated pt current condition, treatment plan and potential prognosis, and answered all the questions.  She expressed understanding and appreciation.    To contact Stroke Continuity provider, please refer  to [email or web address]. After hours, contact General Neurology

## 2023-09-07 NOTE — Progress Notes (Signed)
Inpatient Rehab Admissions Coordinator:  ? ?Per therapy recommendations,  patient was screened for CIR candidacy by Devaney Segers, MS, CCC-SLP. At this time, Pt. Appears to be a a potential candidate for CIR. I will place   order for rehab consult per protocol for full assessment. Please contact me any with questions. ? ?Trine Fread, MS, CCC-SLP ?Rehab Admissions Coordinator  ?[phone] (celll) ?[phone] (office) ? ?

## 2023-09-07 NOTE — Evaluation (Signed)
Physical Therapy Evaluation Patient Details Name: Kyle [name] MRN: [id number] DOB: 05/27/1951 Today's Date: 09/07/2023  History of Present Illness  72 yo male presents to [facility] on 11/11 with R sided weakness, SBP 200s. CTH shows L thalamic ICH. Pt also with large R groin pseudoaneurysm, vascular surgery following at a distance. PMH includes dilated cardiomyopathy, HF, COPD on 4.5L baseline, HTN, PAD, hx of fem pop bypass 2010, aortobifemoral bypass 2016.  Clinical Impression   Pt presents with R hemiplegia, impaired balance with R lateral bias and LUE pushing, impaired postural awareness, max difficulty mobilizing, and decreased activity tolerance. Pt to benefit from acute PT to address deficits. Pt requiring max +2 assist for bed mobility and transfer OOB to recliner towards L, pt with significant RLE buckling in standing. Pt with min DOE during activity, on 10LO2 during session with SPO2 85-90%. Patient will benefit from intensive inpatient follow up therapy, >3 hours/day. PT to progress mobility as tolerated, and will continue to follow acutely.          If plan is discharge home, recommend the following: Two people to help with walking and/or transfers;A lot of help with bathing/dressing/bathroom   Can travel by private vehicle        Equipment Recommendations Other (comment) (tbd)  Recommendations for Other Services       Functional Status Assessment Patient has had a recent decline in their functional status and demonstrates the ability to make significant improvements in function in a reasonable and predictable amount of time.     Precautions / Restrictions Precautions Precautions: Fall Precaution Comments: R hemiplegia Restrictions Weight Bearing Restrictions: No      Mobility  Bed Mobility Overal bed mobility: Needs Assistance Bed Mobility: Supine to Sit     Supine to sit: Max assist, +2 for physical assistance     General bed mobility comments: assist for trunk  elevation and righting, scooting to EOB. LLE hooked under RLE to move LEs to EOB, step-by-step sequencing cues for bed mobility.    Transfers Overall transfer level: Needs assistance Equipment used: 2 person hand held assist Transfers: Sit to/from Stand, Bed to chair/wheelchair/BSC Sit to Stand: +2 physical assistance, Max assist Stand pivot transfers: Max assist, +2 physical assistance         General transfer comment: assist for rise, RLE blocking, steadying, correcting R lateral bias. RLE buckling mid-transfer with OT blocking and OT-corrected, assist for safe lower into recliner.    Ambulation/Gait               General Gait Details: nt  Acupuncturist Bed    Modified Rankin (Stroke Patients Only) Modified Rankin (Stroke Patients Only) Pre-Morbid Rankin Score: No symptoms Modified Rankin: Severe disability     Balance Overall balance assessment: Needs assistance Sitting-balance support: Single extremity supported, Feet supported Sitting balance-Leahy Scale: Poor Sitting balance - Comments: at least min truncal assist to maintain balance, R lateral bias with min LUE pushing towards R (noted when pt's L hand is in lap, on bedrail, or on EOB) Postural control: Right lateral lean, Posterior lean Standing balance support: Bilateral upper extremity supported Standing balance-Leahy Scale: Zero Standing balance comment: max +2                             Pertinent Vitals/Pain Pain Assessment Pain Assessment: No/denies pain  Home Living Family/patient expects to be discharged to:: Private residence Living Arrangements: Spouse/significant other Available Help at Discharge: Family Type of Home: Apartment Home Access: Stairs to enter Entrance Stairs-Rails: Right;Left;Can reach both Secretary/administrator of Steps: 15   Home Layout: One level Home Equipment: None      Prior Function Prior Level of Function :  Independent/Modified Independent                     Extremity/Trunk Assessment   Upper Extremity Assessment Upper Extremity Assessment: Right hand dominant;RUE deficits/detail RUE Deficits / Details: non-functional; no active movement noted; Brunstrom 1; ? flexor tone; +clonus RUE Sensation: decreased light touch RUE Coordination: decreased fine motor;decreased gross motor    Lower Extremity Assessment Lower Extremity Assessment: Defer to PT evaluation RLE Deficits / Details: 1/5 hip flexion, knee extension. otherwise 0/5 RLE Sensation:  (pt denies altered sensation)    Cervical / Trunk Assessment Cervical / Trunk Assessment: Other exceptions (R lateral bias at times; posterior preference)  Communication   Communication Communication: Difficulty communicating thoughts/reduced clarity of speech Cueing Techniques: Tactile cues;Visual cues  Cognition Arousal: Alert Behavior During Therapy: WFL for tasks assessed/performed Overall Cognitive Status: Impaired/Different from baseline Area of Impairment: Safety/judgement, Awareness, Memory                         Safety/Judgement: Decreased awareness of safety Awareness: Emergent   General Comments: A&Ox4        General Comments General comments (skin integrity, edema, etc.): VSS    Exercises     Assessment/Plan    PT Assessment Patient needs continued PT services  PT Problem List Decreased strength;Decreased mobility;Decreased activity tolerance;Decreased balance;Decreased knowledge of use of DME;Pain;Decreased safety awareness;Decreased coordination       PT Treatment Interventions DME instruction;Therapeutic activities;Gait training;Therapeutic exercise;Patient/family education;Balance training;Functional mobility training;Neuromuscular re-education    PT Goals (Current goals can be found in the Care Plan section)  Acute Rehab PT Goals PT Goal Formulation: With patient Time For Goal Achievement:  09/21/23 Potential to Achieve Goals: Good    Frequency Min 1X/week     Co-evaluation PT/OT/SLP Co-Evaluation/Treatment: Yes Reason for Co-Treatment: For patient/therapist safety;To address functional/ADL transfers;Complexity of the patient's impairments (multi-system involvement) PT goals addressed during session: Mobility/safety with mobility;Balance OT goals addressed during session: ADL's and self-care       AM-PAC PT "6 Clicks" Mobility  Outcome Measure Help needed turning from your back to your side while in a flat bed without using bedrails?: A Lot Help needed moving from lying on your back to sitting on the side of a flat bed without using bedrails?: Total Help needed moving to and from a bed to a chair (including a wheelchair)?: Total Help needed standing up from a chair using your arms (e.g., wheelchair or bedside chair)?: Total Help needed to walk in hospital room?: Total Help needed climbing 3-5 steps with a railing? : Total 6 Click Score: 7    End of Session Equipment Utilized During Treatment: Gait belt Activity Tolerance: Patient tolerated treatment well Patient left: in chair;with call bell/phone within reach;with chair alarm set;with nursing/sitter in room Nurse Communication: Mobility status PT Visit Diagnosis: Other abnormalities of gait and mobility (R26.89);Muscle weakness (generalized) (M62.81)    Time: 8413-2440 PT Time Calculation (min) (ACUTE ONLY): 34 min   Charges:   PT Evaluation $PT Eval Moderate Complexity: 1 Mod   PT General Charges $$ ACUTE PT VISIT: 1 Visit  Marye Round, PT DPT Acute Rehabilitation Services Secure Chat Preferred  Office [phone]   Truddie Coco 09/07/2023, 3:52 PM

## 2023-09-08 DIAGNOSIS — I610 Nontraumatic intracerebral hemorrhage in hemisphere, subcortical: Secondary | ICD-10-CM | POA: Diagnosis not present

## 2023-09-08 LAB — BASIC METABOLIC PANEL
Anion gap: 11 (ref 5–15)
BUN: 21 mg/dL (ref 8–23)
CO2: 21 mmol/L — ABNORMAL LOW (ref 22–32)
Calcium: 8.8 mg/dL — ABNORMAL LOW (ref 8.9–10.3)
Chloride: 110 mmol/L (ref 98–111)
Creatinine, Ser: 2.11 mg/dL — ABNORMAL HIGH (ref 0.61–1.24)
GFR, Estimated: 33 mL/min — ABNORMAL LOW (ref >60)
Glucose, Bld: 124 mg/dL — ABNORMAL HIGH (ref 70–99)
Potassium: 4.1 mmol/L (ref 3.5–5.1)
Sodium: 142 mmol/L (ref 135–145)

## 2023-09-08 LAB — CBC
HCT: 40.2 % (ref 39.0–52.0)
Hemoglobin: 13.2 g/dL (ref 13.0–17.0)
MCH: 29.9 pg (ref 26.0–34.0)
MCHC: 32.8 g/dL (ref 30.0–36.0)
MCV: 91.2 fL (ref 80.0–100.0)
Platelets: 163 10*3/uL (ref 150–400)
RBC: 4.41 MIL/uL (ref 4.22–5.81)
RDW: 15.6 % — ABNORMAL HIGH (ref 11.5–15.5)
WBC: 7.6 10*3/uL (ref 4.0–10.5)
nRBC: 0 % (ref 0.0–0.2)

## 2023-09-08 LAB — TRIGLYCERIDES: Triglycerides: 62 mg/dL (ref <150)

## 2023-09-08 MED ORDER — CLONIDINE HCL 0.2 MG PO TABS
0.2000 mg | ORAL_TABLET | Freq: Three times a day (TID) | ORAL | Status: DC
  Administered 2023-09-08: 0.2 mg via ORAL
  Filled 2023-09-08: qty 1

## 2023-09-08 MED ORDER — IPRATROPIUM-ALBUTEROL 0.5-2.5 (3) MG/3ML IN SOLN
3.0000 mL | Freq: Two times a day (BID) | RESPIRATORY_TRACT | Status: DC
  Administered 2023-09-08 – 2023-09-10 (×4): 3 mL via RESPIRATORY_TRACT
  Filled 2023-09-08 (×4): qty 3

## 2023-09-08 MED ORDER — CLONIDINE HCL 0.1 MG PO TABS
0.1000 mg | ORAL_TABLET | Freq: Three times a day (TID) | ORAL | Status: DC
  Administered 2023-09-08: 0.1 mg via ORAL

## 2023-09-08 MED ORDER — CLONIDINE HCL 0.1 MG PO TABS
0.2000 mg | ORAL_TABLET | Freq: Three times a day (TID) | ORAL | Status: DC
  Administered 2023-09-09 – 2023-09-10 (×4): 0.2 mg via ORAL
  Filled 2023-09-08 (×4): qty 2

## 2023-09-08 MED ORDER — CARVEDILOL 6.25 MG PO TABS
6.2500 mg | ORAL_TABLET | Freq: Two times a day (BID) | ORAL | Status: DC
  Administered 2023-09-08 – 2023-09-10 (×5): 6.25 mg via ORAL
  Filled 2023-09-08: qty 2
  Filled 2023-09-08 (×3): qty 1

## 2023-09-08 NOTE — Progress Notes (Signed)
Speech Language Pathology Treatment: Dysphagia;Cognitive-Linquistic  Patient Details Name: Kyle [name] MRN: [id number] DOB: [birth year] Today's Date: 09/08/2023 Time: 1620-1700 SLP Time Calculation (min) (ACUTE ONLY): 40 min  Assessment / Plan / Recommendation Clinical Impression  Patient was awake and alert when SLP entered the room. Nursing staff had just completed transfer from chair to bed. Nurse reported patient's speech had been clearer this afternoon compared to this morning. Patient is demonstrating functional gains towards communicative goals. Moderate dysarthric speech persisting. SLP reviewed strategies for clearer speech (e.g., slow down, shorter sentences, etc). Patient demonstrated effective use of strategy in 5/5 short phrase trials given minimal verbal cueing. Patient reported that slowing his speech has improved communicative success with nursing and his wife, however they are often asking him to repeat himself. He independently employed slower rate of speech when attempting to communicate a want or need with SLP (e.g,. lower the bed, take out the straw). Patient would benefit from continued targeting of strategies for communicative success in the acute setting.  SLP then completed oral care prior to administering POs. Patient mouth with sticky, clear secretions. SLP removed, rinsed, and replaced patient's dentures at his request. Medications were then administered whole in pudding which patient tolerated well with no overt s/sx aspiration. Meal tray arrived shortly thereafter and SLP observed patient with nectar thick liquid and pudding consistencies. Patient with right anterior spillage with both consistencies, but spillage slightly reduced with verbal cueing. Mild oral reside following bites of pudding, cleared with cued lingual sweep. Patient preferred drinking from cup over straw, however required cue to take one sip at a time to avoid multiple, subsequent swallows. No overt s/sx  of aspiration. SLP recommending initiation of dys 1 (puree), nectar thick diet at this time. ST to continue to follow for diet toleration monitoring and potential diet advancement.   HPI HPI: Kyle [name] is a 72 y.o. male with hx of Dilated cardiomyopathy, COPD on oxygen at baseline, PAD, Afibb on eliquis and took his last dose last night who went to sleep at 1900 and woke up and fell on the floor as soon as he tried to get up. Wife called EMS. Noted to have elevated BP to 210 systolic. He was brought in as a code stroke for concern for potential ICH.  MRI Brain revealed "Unchanged acute hemorrhage centered within the left lentiform nuclei  with mild surrounding edema and mild mass effect on the left lateral  ventricle." FEES swallow assessment completed on 09/06/23 and recommendation for initiation of full liquids/nectar thick. ST follow up for dysphagia management.      SLP Plan  Continue with current plan of care      Recommendations for follow up therapy are one component of a multi-disciplinary discharge planning process, led by the attending physician.  Recommendations may be updated based on patient status, additional functional criteria and insurance authorization.    Recommendations  Diet recommendations: Dysphagia 1 (puree);Nectar-thick liquid Liquids provided via: Cup Medication Administration: Whole meds with puree Supervision: Staff to assist with self feeding;Full supervision/cueing for compensatory strategies Compensations: Slow rate;Small sips/bites;Multiple dry swallows after each bite/sip Postural Changes and/or Swallow Maneuvers: Seated upright 90 degrees                  Oral care BID   Frequent or constant Supervision/Assistance Dysphagia, oropharyngeal phase (R13.12);Dysarthria and anarthria (R47.1)     Continue with current plan of care     Marline Backbone, B.S., Speech Therapy Student    09/08/2023,  5:07 PM

## 2023-09-08 NOTE — Progress Notes (Addendum)
  Daily Progress Note  S/p: Hemorrhagic stroke  Subjective: Tired today  Objective: Vitals:   09/08/23 1400 09/08/23 1600  BP: (!) 135/59   Pulse: 78   Resp: 17   Temp:  98.5 F (36.9 C)  SpO2: 91%     Physical Examination  Comfortable  Nonlabored breathing  Regular rate Word finding difficulties  Right groin with palpable fem, no significant changes.    ASSESSMENT/PLAN:  Overall, [place] appears to be doing well.  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  Several will continue to follow, but I have no restrictions at this time, should new onset pain or expanding hematoma occur, please call.  Will continue to follow peripherally.  My plan is to see him in the office in 1 month's time with new imaging to assess the lesion.  He is aware that the lesion cannot be repaired safely at this time due to recent head bleed.   Victorino Sparrow MD [facility] [phone] 09/08/2023  4:36 PM

## 2023-09-08 NOTE — Progress Notes (Signed)
Physical Therapy Treatment Patient Details Name: Kyle [name] MRN: [id number] DOB: July 27, 1951 Today's Date: 09/08/2023   History of Present Illness 72 yo male presents [facility] on 11/11 with R sided weakness, SBP 200s. CTH shows L thalamic ICH. Pt also with large R groin pseudoaneurysm, vascular surgery following at a distance. PMH includes dilated cardiomyopathy, HF, COPD on 4.5L baseline, HTN, PAD, hx of fem pop bypass 2010, aortobifemoral bypass 2016.    PT Comments  Patient progressing with mobility and balance this session.  Initially leaning to R while on EOB but with cues and positioning able to sit with S prior to OOB transfer.  Stepping to chair with L while R knee and hip blocked pt able to transition with less assistance as well.  Noted SpO2 down to 84% despite 8L HFNC.  Increased to 10L and SpO2 88-90%, RN aware.  Seemed better at rest.  PT will continue to follow.     If plan is discharge home, recommend the following: Two people to help with walking and/or transfers;A lot of help with bathing/dressing/bathroom   Can travel by private vehicle        Equipment Recommendations  Other (comment) (TBA)    Recommendations for Other Services       Precautions / Restrictions Precautions Precautions: Fall Precaution Comments: R hemiplegia     Mobility  Bed Mobility Overal bed mobility: Needs Assistance Bed Mobility: Rolling, Sidelying to Sit Rolling: Mod assist Sidelying to sit: Mod assist, +2 for physical assistance       General bed mobility comments: cues for technique, assist to flex R leg and place R hand on abdomen with cues pt able to assist to roll L; pushing with L elbow with A for R leg off bed and lifting trunk    Transfers Overall transfer level: Needs assistance Equipment used: 2 person hand held assist Transfers: Sit to/from Stand, Bed to chair/wheelchair/BSC Sit to Stand: +2 physical assistance, Max assist Stand pivot transfers: Mod assist, +2 physical  assistance         General transfer comment: R LE blocked and A for lifting; pt holding therapists shoulder and cues for stepping with L foot toward recliner; lowering help to sit    Ambulation/Gait                   Stairs             Wheelchair Mobility     Tilt Bed    Modified Rankin (Stroke Patients Only) Modified Rankin (Stroke Patients Only) Pre-Morbid Rankin Score: No symptoms Modified Rankin: Severe disability     Balance Overall balance assessment: Needs assistance Sitting-balance support: Feet supported Sitting balance-Leahy Scale: Poor Sitting balance - Comments: initial min A for balance with mild R bias, after sitting for a bit with A and cues for foot position, pt sitting with S at EOB   Standing balance support: Bilateral upper extremity supported Standing balance-Leahy Scale: Poor Standing balance comment: A for R hip and knee for stability in stance and able to stand with mod to max A                            Cognition Arousal: Alert Behavior During Therapy: WFL for tasks assessed/performed Overall Cognitive Status: Impaired/Different from baseline Area of Impairment: Safety/judgement, Awareness, Memory  Safety/Judgement: Decreased awareness of safety Awareness: Emergent            Exercises Other Exercises Other Exercises: L hip and knee extension x 5 with facilitation with stretch then cues for pusing straight out Other Exercises: L shoulder PROM and stretch noting tight internal rotators    General Comments General comments (skin integrity, edema, etc.): on 8L O2 at rest, noted SpO2 down to 84%, increased to 10L to maintain 88-90%, RN aware      Pertinent Vitals/Pain Pain Assessment Pain Assessment: Faces Faces Pain Scale: Hurts little more Pain Location: R shoulder with ROM Pain Descriptors / Indicators: Discomfort, Grimacing Pain Intervention(s): Monitored during session,  Limited activity within patient's tolerance    Home Living     Available Help at Discharge: Family;Available 24 hours/day (wife)                    Prior Function            PT Goals (current goals can now be found in the care plan section) Progress towards PT goals: Progressing toward goals    Frequency    Min 1X/week      PT Plan      Co-evaluation              AM-PAC PT "6 Clicks" Mobility   Outcome Measure  Help needed turning from your back to your side while in a flat bed without using bedrails?: A Lot Help needed moving from lying on your back to sitting on the side of a flat bed without using bedrails?: Total Help needed moving to and from a bed to a chair (including a wheelchair)?: Total Help needed standing up from a chair using your arms (e.g., wheelchair or bedside chair)?: Total Help needed to walk in hospital room?: Total Help needed climbing 3-5 steps with a railing? : Total 6 Click Score: 7    End of Session Equipment Utilized During Treatment: Gait belt;Oxygen Activity Tolerance: Patient limited by fatigue Patient left: in chair;with call bell/phone within reach;with chair alarm set Nurse Communication: Need for lift equipment PT Visit Diagnosis: Other abnormalities of gait and mobility (R26.89);Muscle weakness (generalized) (M62.81)     Time: 7829-5621 PT Time Calculation (min) (ACUTE ONLY): 26 min  Charges:    $Therapeutic Exercise: 8-22 mins $Therapeutic Activity: 8-22 mins PT General Charges $$ ACUTE PT VISIT: 1 Visit                     Sheran Lawless, PT Acute Rehabilitation Services Office:[phone] 09/08/2023    Kyle [name] 09/08/2023, 5:30 PM

## 2023-09-08 NOTE — PMR Pre-admission (Signed)
PMR Admission Coordinator Pre-Admission Assessment  Patient: Kyle [name] is an 72 y.o., male MRN: [id number] DOB: 12-11-1950 Height: 5\' 11"  (180.3 cm) Weight: 78.9 kg              Insurance Information HMO: yes    PPO:      PCP:      IPA:      80/20:      OTHER:  PRIMARY: [employer] Medicare      Policy#: [id number]; Medicare # [id number]      Subscriber: pt CM Name: [name]      Phone#: [phone] ext [phone]     Fax#: [phone] Pre-Cert#: [id number] 11/15 for 7 days until  11/22    Employer:  Benefits:  Phone #: [phone]     Name: 11/13 Eff. Date: 10/26/22     Deduct: none      Out of Pocket Max: $7750      Life Max:   CIR: $335 co pay per day days 1 until 6      SNF: no co pay per day days 1 until 20; $203 co pay per day days 21 until 100 Outpatient: $25 co pay per visit     Co-Pay:  Home Health: 100%      Co-Pay:  DME: 80%     Co-Pay: 20% Providers: in network  SECONDARY: none      Policy#:       Phone#:   Artist:       Phone#:   The Data processing manager" for patients in Inpatient Rehabilitation Facilities with attached "Privacy Act Statement-Health Care Records" was provided and verbally reviewed with: Patient and Family  Emergency Contact Information Contact Information     Name Relation Home Work Mobile   Silverthorne,[name] [phone]  [phone]      Other Contacts   None on File    Current Medical History  Patient Admitting Diagnosis: ICH  History of Present Illness: 72 year old male with history of dilated cardiomyopathy, COPD on home O2 at 4.5 liters at baseline, PAD, seizures, Afib  on Eliquis, fem pop bypass 2010, aortobifemoral bypass in 2016  who presented on 09/06/23  to [facility] hospital with right arm weakness and difficulty speaking.   CT showed left thalamic hemorrhage. Given Kcentra for ELiquis reversal. CTA head and neck No CTA spot sign or vascular malformation at the left lentiform hemorrhage. 2 d echo EF 55  to 60 %. Now on no anticoagulation.  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 VVS felt chronic and plan to repair in 1 month as an outpatient. CTA Chest/Abd/Pel- Extensive aortic atherosclerosis with multiple penetrating ulcers and ulcerated plaques. 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 Chronic respiratory failure on 4.5 liters at home. Placed on HFNC 10 liters but has weaned down to 6 liters HFNC with activity. CXR with vascular congestion. On lasix daily. For HTN, CHF and dilated cardiomyopathy now on amlodipine, Coreg, Imdur, hydralazine, lasix and clonidine. LDL 61 and Crestor resumed. SLP assessed and on full liquid diet with nectar thick liquids. On Keppra for history of seizures.   Complete NIHSS TOTAL: 11 Glasgow Coma Scale Score: 15  Patient's medical record from [facility] has been reviewed by the rehabilitation admission coordinator and physician.  Past Medical  History  Past Medical History:  Diagnosis Date   Aortic insufficiency    MODERATE WITH A BICUSPID AORTIC VAVLE   Arterial occlusive disease    MULTILEVEL   CHF (congestive heart failure) (HCC)    Chronic bronchitis (HCC)    CKD (chronic kidney disease) stage 3, GFR 30-59 ml/min (HCC)    COPD (chronic obstructive pulmonary disease) (HCC)    Dilated cardiomyopathy (HCC)    WITH EJECTION FRACTION DOWN TO 20-25%--WITH CONGESTIVE HEART FAILURE   Edema    LOWER EXTREMETIES   Emphysema of lung (HCC)    Hyperglycemia 01/2020   Hypertension    Normal coronary arteries 2009   Orthopnea    Peripheral arterial disease (HCC)    Pulmonary hypertension (HCC)    Has the patient had major surgery during 100 days prior to admission? No  Family History  family history includes Diabetes in his mother;  Hyperlipidemia in his sister; Hypertension in his sister.  Current Medications   Current Facility-Administered Medications:    acetaminophen (TYLENOL) tablet 650 mg, 650 mg, Oral, Q4H PRN **OR** acetaminophen (TYLENOL) 160 MG/5ML solution 650 mg, 650 mg, Per Tube, Q4H PRN **OR** acetaminophen (TYLENOL) suppository 650 mg, 650 mg, Rectal, Q4H PRN, Erick Blinks, MD   albuterol (PROVENTIL) (2.5 MG/3ML) 0.083% nebulizer solution 3 mL, 3 mL, Inhalation, Q6H PRN, Pamalee Leyden, Devon, NP   amLODipine (NORVASC) tablet 10 mg, 10 mg, Oral, Daily, Marvel Plan, MD, 10 mg at 09/10/23 0917   carvedilol (COREG) tablet 6.25 mg, 6.25 mg, Oral, BID WC, Marvel Plan, MD, 6.25 mg at 09/10/23 1610   Chlorhexidine Gluconate Cloth 2 % PADS 6 each, 6 each, Topical, Daily, Caryl Pina, MD, 6 each at 09/09/23 9604   cloNIDine (CATAPRES) tablet 0.2 mg, 0.2 mg, Oral, Q8H, Marvel Plan, MD, 0.2 mg at 09/10/23 0541   fluticasone furoate-vilanterol (BREO ELLIPTA) 200-25 MCG/ACT 1 puff, 1 puff, Inhalation, Daily **AND** umeclidinium bromide (INCRUSE ELLIPTA) 62.5 MCG/ACT 1 puff, 1 puff, Inhalation, Daily, Jones, Sarah, DO   furosemide (LASIX) tablet 20 mg, 20 mg, Oral, Daily, Marvel Plan, MD, 20 mg at 09/10/23 5409   hydrALAZINE (APRESOLINE) injection 20 mg, 20 mg, Intravenous, Q4H PRN, Pamalee Leyden, Devon, NP, 20 mg at 09/08/23 0802   hydrALAZINE (APRESOLINE) tablet 100 mg, 100 mg, Oral, TID, Pamalee Leyden, Devon, NP, 100 mg at 09/10/23 0916   isosorbide mononitrate (IMDUR) 24 hr tablet 30 mg, 30 mg, Oral, Daily, Shafer, Devon, NP, 30 mg at 09/10/23 0917   labetalol (NORMODYNE) injection 20 mg, 20 mg, Intravenous, Q2H PRN, Pamalee Leyden, Devon, NP, 20 mg at 09/08/23 0636   levETIRAcetam (KEPPRA) tablet 500 mg, 500 mg, Oral, BID, Marvel Plan, MD, 500 mg at 09/10/23 8119   Oral care mouth rinse, 15 mL, Mouth Rinse, PRN, Caryl Pina, MD, 15 mL at 09/06/23 2210   pantoprazole (PROTONIX) EC tablet 40 mg, 40 mg, Oral, Daily, Marvel Plan, MD, 40 mg at  09/10/23 0915   rosuvastatin (CRESTOR) tablet 10 mg, 10 mg, Oral, QHS, Marvel Plan, MD, 10 mg at 09/09/23 2254   senna-docusate (Senokot-S) tablet 1 tablet, 1 tablet, Oral, BID, Erick Blinks, MD, 1 tablet at 09/10/23 1478  Patients Current Diet:  Diet Order             DIET - DYS 1 Room service appropriate? Yes with Assist; Fluid consistency: Nectar Thick  Diet effective now                  Precautions / Restrictions Precautions Precautions: Fall Precaution  Comments: R hemiplegia Restrictions Weight Bearing Restrictions: No   Has the patient had 2 or more falls or a fall with injury in the past year?No  Prior Activity Level Community (5-7x/wk): independent, retired and driving  Prior Functional Level Prior Function Prior Level of Function : Independent/Modified Independent  Self Care: Did the patient need help bathing, dressing, using the toilet or eating?  Independent  Indoor Mobility: Did the patient need assistance with walking from room to room (with or without device)? Independent  Stairs: Did the patient need assistance with internal or external stairs (with or without device)? Independent  Functional Cognition: Did the patient need help planning regular tasks such as shopping or remembering to take medications? Independent  Patient Information Are you of Hispanic, Latino/a,or Spanish origin?: A. No, not of Hispanic, Latino/a, or Spanish origin What is your race?: B. Black or African American Do you need or want an interpreter to communicate with a doctor or health care staff?: 0. No  Patient's Response To:  Health Literacy and Transportation Is the patient able to respond to health literacy and transportation needs?: Yes Health Literacy - How often do you need to have someone help you when you read instructions, pamphlets, or other written material from your doctor or pharmacy?: Never In the past 12 months, has lack of transportation kept you from  medical appointments or from getting medications?: No In the past 12 months, has lack of transportation kept you from meetings, work, or from getting things needed for daily living?: No  Home Assistive Devices / Equipment Home Equipment: None  Prior Device Use: Indicate devices/aids used by the patient prior to current illness, exacerbation or injury? None of the above  Current Functional Level Cognition  Arousal/Alertness: Awake/alert Overall Cognitive Status: Impaired/Different from baseline Current Attention Level: Focused Orientation Level: Oriented X4 Following Commands: Follows one step commands inconsistently, Follows multi-step commands with increased time Safety/Judgement: Decreased awareness of safety, Decreased awareness of deficits General Comments: unaware of pushing to R    Extremity Assessment (includes Sensation/Coordination)  Upper Extremity Assessment: RUE deficits/detail, Right hand dominant RUE Deficits / Details: non-functional; no active movement noted; Brunstrom 1 RUE Sensation: decreased light touch RUE Coordination: decreased fine motor, decreased gross motor  Lower Extremity Assessment: Defer to PT evaluation RLE Deficits / Details: 1/5 hip flexion, knee extension. otherwise 0/5 RLE Sensation:  (pt denies altered sensation)    ADLs  Overall ADL's : Needs assistance/impaired Eating/Feeding: Minimal assistance Eating/Feeding Details (indicate cue type and reason): thickecned liquids; increased time and cuing for precautions; using non-dominant L hand Grooming: Wash/dry face, Supervision/safety, Set up Grooming Details (indicate cue type and reason): from bed level Upper Body Bathing: Maximal assistance, Sitting Upper Body Bathing Details (indicate cue type and reason): simulated via UB dressing from EOB Lower Body Bathing: Maximal assistance, Sit to/from stand Upper Body Dressing : Maximal assistance, Sitting Upper Body Dressing Details (indicate cue type  and reason): to don new gowns from EOB Lower Body Dressing: Total assistance, Sit to/from stand Toilet Transfer: Maximal assistance, +2 for safety/equipment, Stand-pivot Toilet Transfer Details (indicate cue type and reason): simulated via stand pivot to recliner Toileting- Clothing Manipulation and Hygiene: Total assistance Functional mobility during ADLs: Maximal assistance, +2 for physical assistance, Cueing for safety, Cueing for sequencing General ADL Comments: ADL participation impacted by impaired balance, RUE hemiplegia, and impaired proprioception and motor planning    Mobility  Overal bed mobility: Needs Assistance Bed Mobility: Sit to Supine, Supine to Sit Rolling: Min assist  Sidelying to sit: Max assist Supine to sit: Mod assist Sit to supine: Min assist General bed mobility comments: mod A to bring RLE to and off EOB and to elevate trunk to mindline, min A to return RLE to bed, pt able to self lower trunk and reposition midline and scoot up toward HOB in supine with LUE and LLE    Transfers  Overall transfer level: Needs assistance Equipment used: 1 person hand held assist Transfers: Sit to/from Stand, Bed to chair/wheelchair/BSC Sit to Stand: Max assist Bed to/from chair/wheelchair/BSC transfer type:: Stand pivot Stand pivot transfers: Max assist General transfer comment: RLE blocked during sit>stands x3, max cues to extend LLE as pt with tendency to keep L knee flexed in standing, pt unable to step RLE and impulsively sitting back down,    Ambulation / Gait / Stairs / Wheelchair Mobility  Ambulation/Gait General Gait Details: nt    Posture / Balance Dynamic Sitting Balance Sitting balance - Comments: moments of CGA from EOB with unilateral support from LUE but noted to require up to MODA d/t pushing to R side and pushing posterior Balance Overall balance assessment: Needs assistance Sitting-balance support: Feet supported, Single extremity supported Sitting  balance-Leahy Scale: Poor Sitting balance - Comments: moments of CGA from EOB with unilateral support from LUE but noted to require up to MODA d/t pushing to R side and pushing posterior Postural control: Right lateral lean, Posterior lean Standing balance support: Bilateral upper extremity supported Standing balance-Leahy Scale: Poor Standing balance comment: pt required BUE support in standing from therapists with RLE blocked and assist at trunk to keep trunk elevated and upright    Special needs/care consideration  COPD on 4.5 liters [place] at Baseline    Previous Home Environment  Living Arrangements: Spouse/significant other  Lives With: Spouse Available Help at Discharge: Family, Available 24 hours/day (wife) Type of Home: Apartment Home Layout: One level Home Access: Stairs to enter Entrance Stairs-Rails: Right, Left, Can reach both Entrance Stairs-Number of Steps: 15 Bathroom Shower/Tub: Associate Professor: Yes Home Care Services: No  Discharge Living Setting Plans for Discharge Living Setting: Patient's home, Apartment, Lives with (comment) (wife) Type of Home at Discharge: Apartment Discharge Home Layout: One level Discharge Home Access: Stairs to enter Entrance Stairs-Rails: Right, Left, Can reach both Entrance Stairs-Number of Steps: 15 Discharge Bathroom Shower/Tub: Tub/shower unit Discharge Bathroom Toilet: Standard Discharge Bathroom Accessibility: Yes How Accessible: Accessible via walker Does the patient have any problems obtaining your medications?: No  Second floor apartment for 22 years. Wife stated dtr and son in law will assist getting him in and out of apartment. They live in [place], [name]. Do not want to move to first floor.  Social/Family/Support Systems Patient Roles: Spouse Contact Information: wife, [place] Anticipated Caregiver: wife Anticipated Caregiver's Contact Information: see  contacts Ability/Limitations of Caregiver: no limitations. History of CVA 2002, no residual deficits Caregiver Availability: 24/7 Discharge Plan Discussed with Primary Caregiver: Yes Is Caregiver In Agreement with Plan?: Yes Does Caregiver/Family have Issues with Lodging/Transportation while Pt is in Rehab?: No  Goals Patient/Family Goal for Rehab: min asisst with PT and OT and SLP Expected length of stay: ELOS 12 to 16 days Pt/Family Agrees to Admission and willing to participate: Yes Program Orientation Provided & Reviewed with Pt/Caregiver Including Roles  & Responsibilities: Yes  Decrease burden of Care through IP rehab admission: n/a  Possible need for SNF placement upon discharge:not anticipated  Patient Condition: This patient's medical and functional status  has changed since the consult dated: 09/08/23 in which the Rehabilitation Physician determined and documented that the patient's condition is appropriate for intensive rehabilitative care in an inpatient rehabilitation facility. See "History of Present Illness" (above) for medical update. Functional changes are: max assist. Patient's medical and functional status update has been discussed with the Rehabilitation physician and patient remains appropriate for inpatient rehabilitation. Will admit to inpatient rehab today.  Preadmission Screen Completed By:  Clois Dupes, RN MSN 09/10/2023 11:28 AM ______________________________________________________________________   Discussed status with Dr. Riley Kill on 09/10/23 at 1131 and received approval for admission today.  Admission Coordinator:  Clois Dupes RN MSN time [id number] Date 09/10/23

## 2023-09-08 NOTE — Progress Notes (Signed)
  Inpatient Rehabilitation Admissions Coordinator   Met with patient and wife at bedside for rehab assessment. We discussed goals and expectations of a possible CIR admit. They prefer CIR for rehab. [place] provide expected caregiver support that is recommended .I will begin insurance Auth with [facility] Medicare for possible CIR admit pending approval. Please call me with any questions.   Ottie Glazier, RN, MSN Rehab Admissions Coordinator [phone]

## 2023-09-08 NOTE — Progress Notes (Signed)
STROKE TEAM PROGRESS NOTE   BRIEF HPI Kyle [name] is a 72 y.o. male with history of dilated cardiomyopathy, chronic respiratory failure on 4.5L baseline, hx of fem pop bypass in 2010, an aortobifemoral bypass in 2016 presenting with right sided weakness. CT showed a left thalamic hemorrhage. He was given Kcentra for eliquis reversal in the ED. CT surgery consulted and recommend BP control less than 140 for aortic management. Also found to have a large right pseudoaneurysm and VVS has been consulted. Plan for intervention during this hospitalization.    SIGNIFICANT HOSPITAL EVENTS 11/10- admit to ICU due to [facility] 11/11- VVS consult for pseudoaneurysm   INTERIM HISTORY/SUBJECTIVE Patient was transitioned off Cleviprex overnight.  His blood pressure has been within parameters after doses of antihypertensive meds were staggered.  OBJECTIVE  CBC    Component Value Date/Time   WBC 7.6 09/08/2023 1004   RBC 4.41 09/08/2023 1004   HGB 13.2 09/08/2023 1004   HGB 14.8 02/12/2022 0943   HCT 40.2 09/08/2023 1004   HCT 45.0 02/12/2022 0943   PLT 163 09/08/2023 1004   PLT 174 02/12/2022 0943   MCV 91.2 09/08/2023 1004   MCV 95 02/12/2022 0943   MCH 29.9 09/08/2023 1004   MCHC 32.8 09/08/2023 1004   RDW 15.6 (H) 09/08/2023 1004   RDW 15.0 02/12/2022 0943   LYMPHSABS 1.8 09/06/2023 0514   LYMPHSABS 1.5 02/12/2022 0943   MONOABS 0.9 09/06/2023 0514   EOSABS 0.1 09/06/2023 0514   EOSABS 0.1 02/12/2022 0943   BASOSABS 0.0 09/06/2023 0514   BASOSABS 0.0 02/12/2022 0943    BMET    Component Value Date/Time   NA 142 09/08/2023 1004   NA 145 (H) 02/03/2023 0927   K 4.1 09/08/2023 1004   CL 110 09/08/2023 1004   CO2 21 (L) 09/08/2023 1004   GLUCOSE 124 (H) 09/08/2023 1004   BUN 21 09/08/2023 1004   BUN 22 02/03/2023 0927   CREATININE 2.11 (H) 09/08/2023 1004   CREATININE 0.99 11/12/2014 1344   CALCIUM 8.8 (L) 09/08/2023 1004   EGFR 43 (L) 02/03/2023 0927   GFRNONAA 33 (L)  09/08/2023 1004    IMAGING past 24 hours No results found.  Vitals:   09/08/23 1115 09/08/23 1130 09/08/23 1145 09/08/23 1200  BP: 127/60 130/65 126/62 (!) 125/58  Pulse: 63 65 65 61  Resp: 17 18 19 18   Temp:    97.6 F (36.4 C)  TempSrc:    Oral  SpO2: 91% 95% 92% 92%  Weight:      Height:         PHYSICAL EXAM General:  Alert, well-nourished, well-developed patient in no acute distress Psych:  Mood and affect appropriate for situation CV: Regular rate and rhythm on monitor Respiratory:  Regular, coarse, unlabored respirations 8L, Kyle [name]  NEURO:  Mental Status: AA&Ox3, patient is able to give clear and coherent history Speech/Language: speech is dysarthric, naming and repetition intact  Cranial Nerves:  II: PERRL. Visual fields full.  III, IV, VI: EOMI. Eyelids elevate symmetrically.  V: Sensation is intact to light touch and symmetrical to face.  VII: Right facial droop VIII: hearing intact to voice. IX, X: Voice is dysarthric ZO:[id number] shrug 5/5. XII: tongue is midline without fasciculations. Motor:  RUE- 0/5 RLE - 2/5 LUE and LLE with 5/5 movemet No movement distal with toes Tone: is normal and bulk is normal Sensation- Intact to light touch bilaterally.    Coordination: FTN intact bilaterally.No drift.  Gait- deferred  ASSESSMENT/PLAN  ICH:  left BG ICH s/p kcentra, etiology: Likely uncontrolled HTN in the setting of Eliquis use Code Stroke CT head  Positive for Acute 7 mL hemorrhage in the left lentiform nuclei.   CTA head & neck No CTA spot sign or vascular malformation at the left lentiform hemorrhage MRI  Unchanged acute hemorrhage centered within the left lentiform nuclei with mild surrounding edema and mild mass effect on the left lateral ventricle. 2D Echo EF 55 to 60% LDL 61 HgbA1c 6.2 UDS negative VTE prophylaxis - SCDs Eliquis (apixaban) daily prior to admission, now on no anticoagulation due to ICH  Therapy recommendations:  CIR Disposition:   Pending   Large right groin pseudoaneurysm  CTA Chest/Abd/Pel- status post aorto bi-iliac bypass graft. Both limbs of the graft are widely patent. 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  VVS consulted, likely chronic, plan to repair in 1 month as outpatient. No pain in right groin, pulses palpable  BP goal less than 140  Aortic atherosclerosis CTA neck  - bulky atherosclerotic soft plaque with multiple ulcerations, appearance suspicious for developing intramural hematoma, impending dissection  CTA Chest/Abd/Pel- Extensive aortic atherosclerosis with multiple penetrating ulcers and ulcerated plaques. 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 CTS consulted, no indication for intervention BP goal less than 140  Atrial fibrillation Home Meds: eliquis  Reversed with ? Kcentra Hold off AC at this moment Consider to resume AC in 2 weeks if neuro stable  Hypertension Congestive heart failure  Dilated cardiomyopathy Home meds:  Norvasc, coreg, imdur, hydralazine, lasix BP stable off Cleviprex PRN labetalol and hydralazine Now on amlodipine 10, coreg 6.25 bid, imdur 30, hydralazine 25->100 tid, lasix 20, clonidine 0.1->0.2 mg 3 times daily Blood Pressure Goal: SBP less than 140  Hyperlipidemia Home meds: crestor 10mg  LDL 61 Resume home Crestor 10 No high intensity statin given LDL at goal Continue statin on discharge  Dysphagia Patient has post-stroke dysphagia, SLP consulted On full liquid diet with nectar thick Advance diet as tolerated  Other Active Problems Hx of seizure- continue keppra 500mg  BID COPD on 4.5L [place] at baseline Now on 8L Follows with pulmonology outpatient  CXR vascular congestion On lasix 20mg  daily  IV 60mg  Lasix once  Hospital day  # 2   Patient seen and examined by NP/APP with MD. MD to update note as needed.   Kyle [name] , MSN, AGACNP-BC [facility] See Amion for schedule and pager information 09/08/2023 2:21 PM   ATTENDING NOTE: I reviewed above note and agree with the assessment and plan. Pt was seen and examined.   RN at bedside.  Patient lying in bed, respiratory status improved from yesterday, continue Lasix IV.  BP still on the higher end, off Cleviprex, will increase clonidine from 0.1 mg to 0.2 mg 3 times daily.  Continue amlodipine, Coreg, Imdur, hydralazine and Lasix p.o. vascular surgery plan for outpatient pseudoaneurysm repair in 1 months time.  Encourage p.o. intake, continue oxygen therapy now at 8 liters.  PT and OT recommend CIR.  For detailed assessment and plan, please refer to above/below as I have made changes wherever appropriate.   Marvel Plan, MD PhD Stroke Neurology 09/08/2023 6:04 PM  This patient is critically ill due to ICH, severe aortic atherosclerosis, right groin pseudoaneurysm, uncontrolled hypertension and at significant risk  of neurological worsening, death form hematoma expansion, cerebral edema, aortic dissection, hypertensive encephalopathy. This patient's care requires constant monitoring of vital signs, hemodynamics, respiratory and cardiac monitoring, review of multiple databases, neurological assessment, discussion with family, other specialists and medical decision making of high complexity. I spent 35 minutes of neurocritical care time in the care of this patient.     To contact Stroke Continuity provider, please refer to [email or web address]. After hours, contact General Neurology

## 2023-09-08 NOTE — Progress Notes (Signed)
Called pt's wife and informed her pt was transferred to [phone].

## 2023-09-08 NOTE — Consult Note (Signed)
Physical Medicine and Rehabilitation Consult Reason for Consult: ICH Referring Physician: Stroke MD   HPI: Kyle [name] is a 72 y.o. male who presents to [facility] on 11/11 with right sided weakness and SBP ins 200s. CTH shows L thalamic ICH. 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 PMH includes dilated cardiomyopathy, HF, COPD on 4.5L at baseline, HTN, PAD, history of fem pop bypass in 2010, aortobifemoral bypass in 2016. His current deficits include R hemiplegia, impaired balance with R lateral bias and LUE pushing, impaired postural awareness, Max difficulty mobilizing, and decreased activity tolerance. Physical Medicine & Rehabilitation was consulted to assess candidacy for CIR.     ROS denies pain Past Medical History:  Diagnosis Date   Aortic insufficiency    MODERATE WITH A BICUSPID AORTIC VAVLE   Arterial occlusive disease    MULTILEVEL   CHF (congestive heart failure) (HCC)    Chronic bronchitis (HCC)    CKD (chronic kidney disease) stage 3, GFR 30-59 ml/min (HCC)    COPD (chronic obstructive pulmonary disease) (HCC)    Dilated cardiomyopathy (HCC)    WITH EJECTION FRACTION DOWN TO 20-25%--WITH CONGESTIVE HEART FAILURE   Edema    LOWER EXTREMETIES   Emphysema of lung (HCC)    Hyperglycemia 01/2020   Hypertension    Normal coronary arteries 2009   Orthopnea    Peripheral arterial disease (HCC)    Pulmonary hypertension (HCC)    Past Surgical History:  Procedure Laterality Date   ABDOMINAL AORTAGRAM N/A 11/05/2014   Procedure: ABDOMINAL Ronny Flurry;  Surgeon: Chuck Hint, MD;  Location: [facility] CATH LAB;  Service: Cardiovascular;  Laterality: N/A;   ABDOMINAL AORTOGRAM W/LOWER EXTREMITY Bilateral 04/21/2019   Procedure: ABDOMINAL AORTOGRAM W/LOWER EXTREMITY;  Surgeon: Chuck Hint, MD;  Location: [facility] INVASIVE CV LAB;  Service: Cardiovascular;  Laterality: Bilateral;   ABDOMINAL AORTOGRAM  W/LOWER EXTREMITY Right 05/30/2021   Procedure: ABDOMINAL AORTOGRAM W/LOWER EXTREMITY;  Surgeon: Chuck Hint, MD;  Location: [facility] INVASIVE CV LAB;  Service: Cardiovascular;  Laterality: Right;   AORTA - BILATERAL FEMORAL ARTERY BYPASS GRAFT N/A 12/18/2014   Procedure: AORTOBIFEMORAL BYPASS GRAFT;  Surgeon: Chuck Hint, MD;  Location: [facility] OR;  Service: Vascular;  Laterality: N/A;   CARDIAC CATHETERIZATION  2009   Nl Cors, EF 20%   COLONOSCOPY     ENDARTERECTOMY FEMORAL Left 12/18/2014   Procedure: ENDARTERECTOMY FEMORAL;  Surgeon: Chuck Hint, MD;  Location: [facility] OR;  Service: Vascular;  Laterality: Left;   FALSE ANEURYSM REPAIR Right 04/24/2019   Procedure: REPAIR OF RIGHT FEMORAL ARTERY PSEUDOANEURYSM;  Surgeon: Larina Earthly, MD;  Location: [facility] OR;  Service: Vascular;  Laterality: Right;   FEMORAL-POPLITEAL BYPASS GRAFT  02/21/2011   right   FEMORAL-TIBIAL BYPASS GRAFT Right 04/24/2019   Procedure: REDO BYPASS GRAFT RIGHT FEMORAL-TIBIAL ARTERY USING LEFT LEG VEIN & HEMASHIELD GOLD 8mm GRAFT;  Surgeon: Larina Earthly, MD;  Location: [facility] OR;  Service: Vascular;  Laterality: Right;   IR FLUORO GUIDE CV LINE RIGHT  12/28/2019   IR REMOVAL TUN CV CATH W/O FL  01/11/2020   IR US GUIDE VASC ACCESS RIGHT  12/28/2019   OTHER SURGICAL HISTORY     POST RIGHT FEMOROPOPLITEAL BYPASS GRAFT   PERIPHERAL VASCULAR CATHETERIZATION  11/05/2014   Procedure: LOWER EXTREMITY ANGIOGRAPHY;  Surgeon: Chuck Hint, MD;  Location: [facility] CATH LAB;  Service: Cardiovascular;;   VEIN HARVEST Left 04/24/2019  Procedure: LEFT LEG SAPHENOUS VEIN HARVEST;  Surgeon: Larina Earthly, MD;  Location: [facility] OR;  Service: Vascular;  Laterality: Left;   Family History  Problem Relation Age of Onset   Diabetes Mother    Hyperlipidemia Sister    Hypertension Sister    Social History:  reports that he quit smoking about 4 years ago. His smoking use included cigarettes. He started smoking about 44 years ago. He has a  40 pack-year smoking history. He has never been exposed to tobacco smoke. He has never used smokeless tobacco. He reports that he does not drink alcohol and does not use drugs. Allergies: No Known Allergies Medications Prior to Admission  Medication Sig Dispense Refill   albuterol (VENTOLIN HFA) 108 (90 Base) MCG/ACT inhaler Inhale 2 puffs into the lungs every 6 (six) hours as needed for wheezing or shortness of breath. 8 g 2   amLODipine (NORVASC) 5 MG tablet Take 1 tablet (5 mg total) by mouth daily. 90 tablet 0   apixaban (ELIQUIS) 5 MG TABS tablet TAKE 1 TABLET TWICE DAILY 180 tablet 1   carvedilol (COREG) 6.25 MG tablet TAKE 1 TABLET TWICE DAILY WITH MEALS (NEED MD APPOINTMENT) 180 tablet 3   Fluticasone-Umeclidin-Vilant (TRELEGY ELLIPTA) 200-62.5-25 MCG/ACT AEPB Inhale 1 puff into the lungs daily. 180 each 3   furosemide (LASIX) 40 MG tablet TAKE 1/2 TABLET EVERY DAY 45 tablet 3   guaiFENesin (MUCINEX) 600 MG 12 hr tablet Take 2 tablets (1,200 mg total) by mouth 2 (two) times daily. 30 tablet 0   hydrALAZINE (APRESOLINE) 25 MG tablet TAKE 1 TABLET THREE TIMES DAILY (NEED APPOINTMENT WITH PCP FOR REFILLS) (Patient taking differently: Take 25 mg by mouth 3 (three) times daily. (NEED APPOINTMENT WITH PCP FOR REFILLS)) 90 tablet 0   insulin degludec (TRESIBA FLEXTOUCH) 100 UNIT/ML FlexTouch Pen Inject 14 Units into the skin daily. 3 mL 1   isosorbide mononitrate (IMDUR) 30 MG 24 hr tablet TAKE 1 TABLET BY MOUTH ONCE DAILY. 90 tablet 1   levETIRAcetam (KEPPRA) 500 MG tablet Take 1 tablet (500 mg total) by mouth daily. 60 tablet 0   Multiple Vitamins-Minerals (CENTRUM SILVER 50+MEN) TABS Take 1 tablet by mouth daily with breakfast.     NOVOLOG FLEXPEN 100 UNIT/ML FlexPen INJECT 5 UNITS SUBCUTANEOUSLY THREE TIMES DAILY WITH MEALS 3 mL 0   OXYGEN Inhale 4.5 L/min into the lungs continuous.     rosuvastatin (CRESTOR) 10 MG tablet TAKE 1 TABLET AT BEDTIME 90 tablet 3   Accu-Chek FastClix Lancets MISC  USE TO CHECK GLUCOSE AS INDICATED 204 each 3   DROPLET PEN NEEDLES 32G X 4 MM MISC USE AS DIRECTED WITH INSULIN 300 each 3   glucose blood (ACCU-CHEK GUIDE) test strip TEST BLOOD SUGAR TWICE DAILY 200 strip 2    Home: Home Living Family/patient expects to be discharged to:: Private residence Living Arrangements: Spouse/significant other Available Help at Discharge: Family Type of Home: Apartment Home Access: Stairs to enter Secretary/administrator of Steps: 15 Entrance Stairs-Rails: Right, Left, Can reach both Home Layout: One level Bathroom Shower/Tub: Associate Professor: Yes Home Equipment: None  Lives With: Spouse  Functional History: Prior Function Prior Level of Function : Independent/Modified Independent Functional Status:  Mobility: Bed Mobility Overal bed mobility: Needs Assistance Bed Mobility: Supine to Sit Supine to sit: Max assist, +2 for physical assistance General bed mobility comments: assist for trunk elevation and righting, scooting to EOB. LLE hooked under RLE to move LEs to  EOB, step-by-step sequencing cues for bed mobility. Transfers Overall transfer level: Needs assistance Equipment used: 2 person hand held assist Transfers: Sit to/from Stand, Bed to chair/wheelchair/BSC Sit to Stand: +2 physical assistance, Max assist Bed to/from chair/wheelchair/BSC transfer type:: Stand pivot Stand pivot transfers: Max assist, +2 physical assistance General transfer comment: assist for rise, RLE blocking, steadying, correcting R lateral bias. RLE buckling mid-transfer with OT blocking and OT-corrected, assist for safe lower into recliner. Ambulation/Gait General Gait Details: nt    ADL: ADL Overall ADL's : Needs assistance/impaired Eating/Feeding: Minimal assistance Eating/Feeding Details (indicate cue type and reason): thickecned liquids; increased time and cuing for precautions; using non-dominant L hand Grooming:  Minimal assistance Grooming Details (indicate cue type and reason): able to brush teeth with [place] Upper Body Bathing: Moderate assistance Lower Body Bathing: Maximal assistance, Sit to/from stand Upper Body Dressing : Maximal assistance Lower Body Dressing: Total assistance, Sit to/from stand Toilet Transfer: Maximal assistance, +2 for safety/equipment Toileting- Clothing Manipulation and Hygiene: Total assistance Functional mobility during ADLs: Maximal assistance, +2 for physical assistance  Cognition: Cognition Overall Cognitive Status: Impaired/Different from baseline Arousal/Alertness: Awake/alert Orientation Level: Oriented X4 Cognition Arousal: Alert Behavior During Therapy: WFL for tasks assessed/performed Overall Cognitive Status: Impaired/Different from baseline Area of Impairment: Safety/judgement, Awareness, Memory Safety/Judgement: Decreased awareness of safety Awareness: Emergent General Comments: A&Ox4; unaware of pushing at times; will further assess  Blood pressure (!) 151/63, pulse 76, temperature 97.6 F (36.4 C), temperature source Oral, resp. rate (!) 21, height 5\' 11"  (1.803 m), weight 75.5 kg, SpO2 92%. Physical Exam Gen: no distress, normal appearing HEENT: oral mucosa pink and moist, NCAT Cardio: Reg rate Chest: normal effort, normal rate of breathing Abd: soft, non-distended Ext: no edema Psych: pleasant, normal affect Skin: intact Neuro: Alert, right sided completely flaccid with flexor tone, decreased sensation throughout right side  Results for orders placed or performed during the hospital encounter of 09/06/23 (from the past 24 hour(s))  Triglycerides     Status: None   Collection Time: 09/08/23  5:32 AM  Result Value Ref Range   Triglycerides 62 <150 mg/dL   DG CHEST PORT 1 VIEW  Result Date: 09/06/2023 CLINICAL DATA:  Respiratory abnormality EXAM: PORTABLE CHEST 1 VIEW COMPARISON:  X-ray 04/10/2021.  CT 09/06/2023 earlier. FINDINGS:  Enlarged cardiopericardial silhouette. Vascular congestion. Chronic lung changes. No pneumothorax, effusion or consolidation. Slight thickening along the minor fissure. IMPRESSION: Enlarged cardiac silhouette.  Vascular congestion. Electronically Signed   By: Karen Kays M.D.   On: 09/06/2023 19:19   ECHOCARDIOGRAM COMPLETE  Result Date: 09/06/2023    ECHOCARDIOGRAM REPORT   Patient Name:   Kyle [name] Date of Exam: 09/06/2023 Medical Rec #:  [id number]      Height:       71.0 in Accession #:    [id number]     Weight:       166.4 lb Date of Birth:  Jun 08, 1951      BSA:          1.950 m Patient Age:    72 years       BP:           134/109 mmHg Patient Gender: M              HR:           75 bpm. Exam Location:  Inpatient Procedure: 2D Echo, 3D Echo, Cardiac Doppler, Color Doppler and Strain Analysis Indications:    Stroke  History:  Patient has prior history of Echocardiogram examinations, most                 recent 04/05/2023. Risk Factors:Diabetes, Dyslipidemia and                 Hypertension.  Sonographer:    Karma Ganja Referring Phys: [id number] Marvel Plan  Sonographer Comments: Global longitudinal strain was attempted. IMPRESSIONS  1. Left ventricular ejection fraction, by estimation, is 55 to 60%. The left ventricle has normal function. The left ventricle has no regional wall motion abnormalities. There is moderate concentric left ventricular hypertrophy. Left ventricular diastolic parameters are consistent with Grade I diastolic dysfunction (impaired relaxation). The average left ventricular global longitudinal strain is -15.5 %. The global longitudinal strain is abnormal.  2. Right ventricular systolic function is normal. The right ventricular size is normal. There is normal pulmonary artery systolic pressure. The estimated right ventricular systolic pressure is 35.6 mmHg.  3. Left atrial size was mildly dilated.  4. Right atrial size was mildly dilated.  5. The mitral valve is normal in  structure. No evidence of mitral valve regurgitation. No evidence of mitral stenosis.  6. The aortic valve is tricuspid. There is severe calcifcation of the aortic valve. Aortic valve regurgitation is mild. Severe aortic valve stenosis. Aortic valve mean gradient measures 36.0 mmHg, AVA 0.95 cm^2.  7. The inferior vena cava is dilated in size with <50% respiratory variability, suggesting right atrial pressure of 15 mmHg. FINDINGS  Left Ventricle: Left ventricular ejection fraction, by estimation, is 55 to 60%. The left ventricle has normal function. The left ventricle has no regional wall motion abnormalities. The average left ventricular global longitudinal strain is -15.5 %. The global longitudinal strain is abnormal. The left ventricular internal cavity size was normal in size. There is moderate concentric left ventricular hypertrophy. Left ventricular diastolic parameters are consistent with Grade I diastolic dysfunction (impaired relaxation). Right Ventricle: The right ventricular size is normal. No increase in right ventricular wall thickness. Right ventricular systolic function is normal. There is normal pulmonary artery systolic pressure. The tricuspid regurgitant velocity is 2.27 m/s, and  with an assumed right atrial pressure of 15 mmHg, the estimated right ventricular systolic pressure is 35.6 mmHg. Left Atrium: Left atrial size was mildly dilated. Right Atrium: Right atrial size was mildly dilated. Pericardium: There is no evidence of pericardial effusion. Mitral Valve: The mitral valve is normal in structure. There is mild calcification of the mitral valve leaflet(s). Mild mitral annular calcification. No evidence of mitral valve regurgitation. No evidence of mitral valve stenosis. Tricuspid Valve: The tricuspid valve is normal in structure. Tricuspid valve regurgitation is trivial. Aortic Valve: The aortic valve is tricuspid. There is severe calcifcation of the aortic valve. Aortic valve regurgitation  is mild. Aortic regurgitation PHT measures 509 msec. Severe aortic stenosis is present. Aortic valve mean gradient measures 36.0 mmHg. Aortic valve peak gradient measures 59.7 mmHg. Aortic valve area, by VTI measures 1.03 cm. Pulmonic Valve: The pulmonic valve was normal in structure. Pulmonic valve regurgitation is not visualized. Aorta: The aortic root is normal in size and structure. Venous: The inferior vena cava is dilated in size with less than 50% respiratory variability, suggesting right atrial pressure of 15 mmHg. IAS/Shunts: No atrial level shunt detected by color flow Doppler.  LEFT VENTRICLE PLAX 2D LVIDd:         4.70 cm   Diastology LVIDs:         2.70 cm   LV e'  medial:    5.55 cm/s LV PW:         1.40 cm   LV E/e' medial:  12.9 LV IVS:        1.20 cm   LV e' lateral:   8.05 cm/s LVOT diam:     2.00 cm   LV E/e' lateral: 8.9 LV SV:         76 LV SV Index:   39        2D Longitudinal Strain LVOT Area:     3.14 cm  2D Strain GLS Avg:     -15.5 %                           3D Volume EF:                          3D EF:        50 %                          LV EDV:       172 ml                          LV ESV:       86 ml                          LV SV:        87 ml RIGHT VENTRICLE             IVC RV Basal diam:  4.60 cm     IVC diam: 2.20 cm RV S prime:     16.20 cm/s TAPSE (M-mode): 3.2 cm LEFT ATRIUM             Index        RIGHT ATRIUM           Index LA diam:        3.60 cm 1.85 cm/m   RA Area:     23.40 cm LA Vol (A2C):   75.6 ml 38.76 ml/m  RA Volume:   73.60 ml  37.74 ml/m LA Vol (A4C):   56.3 ml 28.87 ml/m LA Biplane Vol: 67.3 ml 34.51 ml/m  AORTIC VALVE AV Area (Vmax):    0.98 cm AV Area (Vmean):   0.96 cm AV Area (VTI):     1.03 cm AV Vmax:           386.33 cm/s AV Vmean:          271.667 cm/s AV VTI:            0.738 m AV Peak Grad:      59.7 mmHg AV Mean Grad:      36.0 mmHg LVOT Vmax:         120.00 cm/s LVOT Vmean:        83.200 cm/s LVOT VTI:          0.242 m LVOT/AV VTI ratio:  0.33 AI PHT:            509 msec  AORTA Ao Root diam: 3.40 cm Ao Asc diam:  3.20 cm MITRAL VALVE                TRICUSPID VALVE MV Area (PHT): 3.99 cm     TR Peak grad:  20.6 mmHg MV Decel Time: 190 msec     TR Vmax:        227.00 cm/s MV E velocity: 71.70 cm/s MV A velocity: 108.00 cm/s  SHUNTS MV E/A ratio:  0.66         Systemic VTI:  0.24 m                             Systemic Diam: 2.00 cm Dalton McleanMD Electronically signed by Wilfred Lacy Signature Date/Time: 09/06/2023/4:34:36 PM    Final     Assessment/Plan: Diagnosis: ICH Does the need for close, 24 hr/day medical supervision in concert with the patient's rehab needs make it unreasonable for this patient to be served in a less intensive setting? Yes Co-Morbidities requiring supervision/potential complications:  1) HTN: continue to monitor BP TID 2) AKI: continue to monitor creatinine 3) Prediabetes: conitnue to monitor blood sugars 4) Flexor tone: consider starting baclofen HS 5) Constipation: continue Senna-docusate  Due to bladder management, bowel management, safety, skin/wound care, disease management, medication administration, pain management, and patient education, does the patient require 24 hr/day rehab nursing? Yes Does the patient require coordinated care of a physician, rehab nurse, therapy disciplines of PT, OT, SLP to address physical and functional deficits in the context of the above medical diagnosis(es)? Yes Addressing deficits in the following areas: balance, endurance, locomotion, strength, transferring, bowel/bladder control, bathing, dressing, feeding, grooming, toileting, and psychosocial support Can the patient actively participate in an intensive therapy program of at least 3 hrs of therapy per day at least 5 days per week? Yes The potential for patient to make measurable gains while on inpatient rehab is excellent Anticipated functional outcomes upon discharge from inpatient rehab are min assist  with PT,  min assist with OT, min assist with SLP. Estimated rehab length of stay to reach the above functional goals is: 12-16 days Anticipated discharge destination: Home Overall Rehab/Functional Prognosis: fair  POST ACUTE RECOMMENDATIONS: This patient's condition is appropriate for continued rehabilitative care in the following setting: CIR Patient has agreed to participate in recommended program. Yes Note that insurance prior authorization may be required for reimbursement for recommended care.    I have personally performed a face to face diagnostic evaluation of this patient. Additionally, I have examined the patient's medical record including any pertinent labs and radiographic images. If the physician assistant has documented in this note, I have reviewed and edited or otherwise concur with the physician assistant's documentation.  Thanks,  Horton Chin, MD 09/08/2023

## 2023-09-09 ENCOUNTER — Ambulatory Visit: Payer: Medicare HMO | Admitting: Student

## 2023-09-09 DIAGNOSIS — I610 Nontraumatic intracerebral hemorrhage in hemisphere, subcortical: Secondary | ICD-10-CM | POA: Diagnosis not present

## 2023-09-09 LAB — CBC
HCT: 42.7 % (ref 39.0–52.0)
Hemoglobin: 13.9 g/dL (ref 13.0–17.0)
MCH: 29.9 pg (ref 26.0–34.0)
MCHC: 32.6 g/dL (ref 30.0–36.0)
MCV: 91.8 fL (ref 80.0–100.0)
Platelets: 162 10*3/uL (ref 150–400)
RBC: 4.65 MIL/uL (ref 4.22–5.81)
RDW: 15.6 % — ABNORMAL HIGH (ref 11.5–15.5)
WBC: 7.5 10*3/uL (ref 4.0–10.5)
nRBC: 0 % (ref 0.0–0.2)

## 2023-09-09 LAB — BASIC METABOLIC PANEL
Anion gap: 9 (ref 5–15)
BUN: 23 mg/dL (ref 8–23)
CO2: 23 mmol/L (ref 22–32)
Calcium: 8.6 mg/dL — ABNORMAL LOW (ref 8.9–10.3)
Chloride: 105 mmol/L (ref 98–111)
Creatinine, Ser: 2.12 mg/dL — ABNORMAL HIGH (ref 0.61–1.24)
GFR, Estimated: 33 mL/min — ABNORMAL LOW (ref >60)
Glucose, Bld: 122 mg/dL — ABNORMAL HIGH (ref 70–99)
Potassium: 3.9 mmol/L (ref 3.5–5.1)
Sodium: 137 mmol/L (ref 135–145)

## 2023-09-09 NOTE — Care Management Important Message (Signed)
Important Message  Patient Details  Name: Kyle [name] MRN: [id number] Date of Birth: August 05, 1951   Important Message Given:     Patient was asleep IM given to the Case Manager who will give it to the patient    Dorena Bodo 09/09/2023, 3:07 PM

## 2023-09-09 NOTE — Progress Notes (Signed)
Occupational Therapy Treatment Patient Details Name: Kyle [name] MRN: [id number] DOB: 1950/11/19 Today's Date: 09/09/2023   History of present illness 72 yo male presents to [facility] on 11/11 with R sided weakness, SBP 200s. CTH shows L thalamic ICH. Pt also with large R groin pseudoaneurysm, vascular surgery following at a distance. PMH includes dilated cardiomyopathy, HF, COPD on 4.5L baseline, HTN, PAD, hx of fem pop bypass 2010, aortobifemoral bypass 2016.   OT comments  Pt making steady progress towards OT goals this session. Pt continues to present with RUE hemiplegia, impaired balance and impaired proprioception. Session focus on improving sitting balance for ADL participation and functional transfers. Pt currently requires MAX A +2 for sit>stands from EOB and to complete stand pivot to L side with RLE blocked. RLE noted to buckle during mobility, no active movement noted at this time in RUE with sensation in RUE seeming to be [facility] per gross assessment. Pt required up to MOD A for static sitting balance from EOB with pt pushing to R side. Pt highly motivated to return to PLOF, wife can provide needed support at DC per pt. Pt would continue to benefit from skilled occupational therapy while admitted and after d/c to address the below listed limitations in order to improve overall functional mobility and facilitate independence with BADL participation. DC plan remains appropriate, will follow acutely per POC.   pt on 10 L HFNC with SpO2 WFL, 98%        If plan is discharge home, recommend the following:  Two people to help with walking and/or transfers;Two people to help with bathing/dressing/bathroom;Assistance with cooking/housework;Assistance with feeding;Direct supervision/assist for medications management;Direct supervision/assist for financial management;Assist for transportation;Help with stairs or ramp for entrance   Equipment Recommendations  BSC/3in1    Recommendations for Other  Services      Precautions / Restrictions Precautions Precautions: Fall Precaution Comments: R hemiplegia Restrictions Weight Bearing Restrictions: No       Mobility Bed Mobility Overal bed mobility: Needs Assistance Bed Mobility: Rolling, Sidelying to Sit Rolling: Min assist Sidelying to sit: Max assist       General bed mobility comments: pt completed supine>sit with overall MAX A. utilized hooking method to maneuver RLE to EOB, pt needed assist to elevate trunk and cues to sequence hand placement on bed rails    Transfers Overall transfer level: Needs assistance Equipment used: 2 person hand held assist Transfers: Sit to/from Stand, Bed to chair/wheelchair/BSC Sit to Stand: +2 physical assistance, Max assist Stand pivot transfers: Max assist, +2 physical assistance, +2 safety/equipment         General transfer comment: RLE blocked during sit>stands and stant pivot to L side. pt unable to advance RLE during transfer needing increased assist to initiate pivotal steps to recliner.     Balance Overall balance assessment: Needs assistance Sitting-balance support: Feet supported, Single extremity supported Sitting balance-Leahy Scale: Poor Sitting balance - Comments: moments of CGA from EOB with unilateral support from LUE but noted to require up to MODA d/t pushing to R side Postural control: Right lateral lean, Posterior lean Standing balance support: Bilateral upper extremity supported Standing balance-Leahy Scale: Poor Standing balance comment: pt required BUE support in standing from therapists with RLE blocked and assist at trunk to keep trunk elevated and upright                           ADL either performed or assessed with clinical judgement  ADL Overall ADL's : Needs assistance/impaired     Grooming: Wash/dry face;Supervision/safety;Set up Grooming Details (indicate cue type and reason): from bed level Upper Body Bathing: Maximal  assistance;Sitting Upper Body Bathing Details (indicate cue type and reason): simulated via UB dressing from EOB     Upper Body Dressing : Maximal assistance;Sitting Upper Body Dressing Details (indicate cue type and reason): to don new gowns from EOB     Toilet Transfer: Maximal assistance;+2 for safety/equipment;Stand-pivot Toilet Transfer Details (indicate cue type and reason): simulated via stand pivot to recliner         Functional mobility during ADLs: Maximal assistance;+2 for physical assistance;Cueing for safety;Cueing for sequencing General ADL Comments: ADL participation impacted by impaired balance, RUE hemiplegia, and impaired proprioception and motor planning    Extremity/Trunk Assessment Upper Extremity Assessment Upper Extremity Assessment: RUE deficits/detail;Right hand dominant RUE Deficits / Details: non-functional; no active movement noted; Brunstrom 1 RUE Sensation: decreased light touch RUE Coordination: decreased fine motor;decreased gross motor   Lower Extremity Assessment Lower Extremity Assessment: Defer to PT evaluation        Vision Patient Visual Report: No change from baseline     Perception Perception Perception: Impaired Preception Impairment Details: Inattention/Neglect   Praxis Praxis Praxis: Impaired Praxis Impairment Details: Motor planning    Cognition Arousal: Alert Behavior During Therapy: WFL for tasks assessed/performed Overall Cognitive Status: Impaired/Different from baseline Area of Impairment: Safety/judgement, Awareness, Following commands, Attention                   Current Attention Level: Focused   Following Commands: Follows one step commands inconsistently, Follows multi-step commands with increased time Safety/Judgement: Decreased awareness of safety, Decreased awareness of deficits Awareness: Emergent   General Comments: unaware of pushing        Exercises      Shoulder Instructions        General Comments pt on 10 L HFNC with SpO2 WFL, 98%    Pertinent Vitals/ Pain       Pain Assessment Pain Assessment: No/denies pain  Home Living                                          Prior Functioning/Environment              Frequency  Min 1X/week        Progress Toward Goals  OT Goals(current goals can now be found in the care plan section)  Progress towards OT goals: Progressing toward goals  Acute Rehab OT Goals Patient Stated Goal: to get OOB OT Goal Formulation: With patient Time For Goal Achievement: 09/21/23 Potential to Achieve Goals: Good  Plan      Co-evaluation                 AM-PAC OT "6 Clicks" Daily Activity     Outcome Measure   Help from another person eating meals?: A Little Help from another person taking care of personal grooming?: A Little Help from another person toileting, which includes using toliet, bedpan, or urinal?: Total Help from another person bathing (including washing, rinsing, drying)?: A Lot Help from another person to put on and taking off regular upper body clothing?: A Lot Help from another person to put on and taking off regular lower body clothing?: Total 6 Click Score: 12    End of Session Equipment Utilized During Treatment: Gait  belt  OT Visit Diagnosis: Unsteadiness on feet (R26.81);Other abnormalities of gait and mobility (R26.89);Muscle weakness (generalized) (M62.81);Apraxia (R48.2);Other symptoms and signs involving the nervous system (R29.898);Hemiplegia and hemiparesis Hemiplegia - Right/Left: Right Hemiplegia - dominant/non-dominant: Dominant Hemiplegia - caused by: Nontraumatic intracerebral hemorrhage   Activity Tolerance Patient tolerated treatment well   Patient Left in chair;with call bell/phone within reach;with chair alarm set;with nursing/sitter in room   Nurse Communication Mobility status;Need for lift equipment;Other (comment) (+2 stedy back to bed)         Time: 6295-2841 OT Time Calculation (min): 19 min  Charges: OT General Charges $OT Visit: 1 Visit OT Treatments $Self Care/Home Management : 8-22 mins  Lenor Derrick., COTA/L Acute Rehabilitation Services [phone]   Barron Schmid 09/09/2023, 9:43 AM

## 2023-09-09 NOTE — Progress Notes (Addendum)
Inpatient Rehabilitation Admissions Coordinator   I have received insurance approval for Cir, but unable to admit with 10 L HFNC needed as he was this am.Dr Roda Shutters made aware. Noted weaned to 6 liters HFNC by this afternoon from 10 liters this am.   Ottie Glazier, RN, MSN Rehab Admissions Coordinator [phone] 09/09/2023 5:36 PM

## 2023-09-09 NOTE — Plan of Care (Signed)
  Problem: Education: Goal: Knowledge of disease or condition will improve Outcome: Progressing Goal: Knowledge of secondary prevention will improve (MUST DOCUMENT ALL) Outcome: Progressing Goal: Knowledge of patient specific risk factors will improve Loraine Leriche N/A or DELETE if not current risk factor) Outcome: Progressing   Problem: Intracerebral Hemorrhage Tissue Perfusion: Goal: Complications of Intracerebral Hemorrhage will be minimized Outcome: Progressing   Problem: Coping: Goal: Will verbalize positive feelings about self Outcome: Progressing Goal: Will identify appropriate support needs Outcome: Progressing   Problem: Health Behavior/Discharge Planning: Goal: Ability to manage health-related needs will improve Outcome: Progressing Goal: Goals will be collaboratively established with patient/family Outcome: Progressing   Problem: Self-Care: Goal: Ability to participate in self-care as condition permits will improve Outcome: Progressing Goal: Verbalization of feelings and concerns over difficulty with self-care will improve Outcome: Progressing Goal: Ability to communicate needs accurately will improve Outcome: Progressing   Problem: Nutrition: Goal: Risk of aspiration will decrease Outcome: Progressing Goal: Dietary intake will improve Outcome: Progressing   Problem: Education: Goal: Knowledge of General Education information will improve Description: Including pain rating scale, medication(s)/side effects and non-pharmacologic comfort measures Outcome: Progressing   Problem: Health Behavior/Discharge Planning: Goal: Ability to manage health-related needs will improve Outcome: Progressing   Problem: Clinical Measurements: Goal: Ability to maintain clinical measurements within normal limits will improve Outcome: Progressing Goal: Will remain free from infection Outcome: Progressing Goal: Diagnostic test results will improve Outcome: Progressing Goal:  Respiratory complications will improve Outcome: Progressing Goal: Cardiovascular complication will be avoided Outcome: Progressing   Problem: Activity: Goal: Risk for activity intolerance will decrease Outcome: Progressing   Problem: Nutrition: Goal: Adequate nutrition will be maintained Outcome: Progressing   Problem: Coping: Goal: Level of anxiety will decrease Outcome: Progressing   Problem: Elimination: Goal: Will not experience complications related to bowel motility Outcome: Progressing Goal: Will not experience complications related to urinary retention Outcome: Progressing   Problem: Pain Management: Goal: General experience of comfort will improve Outcome: Progressing   Problem: Safety: Goal: Ability to remain free from injury will improve Outcome: Progressing   Problem: Skin Integrity: Goal: Risk for impaired skin integrity will decrease Outcome: Progressing

## 2023-09-09 NOTE — Progress Notes (Signed)
Inpatient Rehabilitation Admissions Coordinator   I await insurance approval and weaning of his O2 requirements below need for 10 liters HFNC before admit to CIR.  Ottie Glazier, RN, MSN Rehab Admissions Coordinator [phone] 09/09/2023 12:22 PM

## 2023-09-09 NOTE — Care Management Important Message (Signed)
Important Message  Patient Details  Name: Kyle [name] MRN: [id number] Date of Birth: Mar 22, 1951   Important Message Given:  Yes - Medicare IM     Dorena Bodo 09/09/2023, 3:07 PM

## 2023-09-09 NOTE — Progress Notes (Signed)
Physical Therapy Treatment Patient Details Name: Kyle [name] MRN: [id number] DOB: Dec 08, 1950 Today's Date: 09/09/2023   History of Present Illness 72 yo male presents to [facility] on 11/11 with R sided weakness, SBP 200s. CTH shows L thalamic ICH. Pt also with large R groin pseudoaneurysm, vascular surgery following at a distance. PMH includes dilated cardiomyopathy, HF, COPD on 4.5L baseline, HTN, PAD, hx of fem pop bypass 2010, aortobifemoral bypass 2016.    PT Comments  Pt greeted up in chair on arrival, eager to return to bed as pt endorsing discomfort. Pt requiring max A with RLE blocked to come to stand x2 from chair. Pt demonstrating improved posture this session, with trunk extended. Pt with noted bil knee buckling in standing needing max A to maintain balance. Pt unable to step RLE during transfer to EOB needing max A to pivot and for eccentric control to sitting. Pt declining further exercises due to fatigue and returning to supine with min A to bring RLE up and into bed. Current plan remains appropriate to address deficits and maximize functional independence and decrease caregiver burden. Pt continues to benefit from skilled PT services to progress toward functional mobility goals.     If plan is discharge home, recommend the following: Two people to help with walking and/or transfers;A lot of help with bathing/dressing/bathroom   Can travel by private vehicle        Equipment Recommendations  Other (comment) (TBA)    Recommendations for Other Services       Precautions / Restrictions Precautions Precautions: Fall Precaution Comments: R hemiplegia Restrictions Weight Bearing Restrictions: No     Mobility  Bed Mobility Overal bed mobility: Needs Assistance Bed Mobility: Sit to Supine       Sit to supine: Min assist   General bed mobility comments: min A to return RLE to be, pt able to self lower trunk    Transfers Overall transfer level: Needs assistance Equipment  used: 1 person hand held assist Transfers: Sit to/from Stand, Bed to chair/wheelchair/BSC Sit to Stand: Max assist Stand pivot transfers: Max assist         General transfer comment: RLE blocked during sit>stands and stant pivot to L side. pt able to maintain upright standing, extending trunk to stretch with mod A, max A to pivot to L, max cues for hand placement as pt not wanting to release grasp on bedrail on L    Ambulation/Gait               General Gait Details: nt   Stairs             Wheelchair Mobility     Tilt Bed    Modified Rankin (Stroke Patients Only) Modified Rankin (Stroke Patients Only) Pre-Morbid Rankin Score: No symptoms Modified Rankin: Severe disability     Balance Overall balance assessment: Needs assistance Sitting-balance support: Feet supported, Single extremity supported Sitting balance-Leahy Scale: Poor Sitting balance - Comments: moments of CGA from EOB with unilateral support from LUE but noted to require up to MODA d/t pushing to R side Postural control: Right lateral lean, Posterior lean Standing balance support: Bilateral upper extremity supported Standing balance-Leahy Scale: Poor Standing balance comment: pt required BUE support in standing from therapists with RLE blocked and assist at trunk to keep trunk elevated and upright                            Cognition Arousal: Alert  Behavior During Therapy: WFL for tasks assessed/performed Overall Cognitive Status: Impaired/Different from baseline Area of Impairment: Safety/judgement, Awareness, Following commands, Attention                   Current Attention Level: Focused   Following Commands: Follows one step commands inconsistently, Follows multi-step commands with increased time Safety/Judgement: Decreased awareness of safety, Decreased awareness of deficits Awareness: Emergent   General Comments: unaware of pushing        Exercises       General Comments General comments (skin integrity, edema, etc.): VSS on 10L HFNC      Pertinent Vitals/Pain Pain Assessment Pain Assessment: Faces Faces Pain Scale: Hurts a little bit Pain Location: generalized from time up in chair Pain Descriptors / Indicators: Discomfort Pain Intervention(s): Repositioned, Monitored during session, Limited activity within patient's tolerance    Home Living                          Prior Function            PT Goals (current goals can now be found in the care plan section) Acute Rehab PT Goals PT Goal Formulation: With patient Time For Goal Achievement: 09/21/23 Potential to Achieve Goals: Good Progress towards PT goals: Progressing toward goals    Frequency    Min 1X/week      PT Plan      Co-evaluation              AM-PAC PT "6 Clicks" Mobility   Outcome Measure  Help needed turning from your back to your side while in a flat bed without using bedrails?: A Lot Help needed moving from lying on your back to sitting on the side of a flat bed without using bedrails?: Total Help needed moving to and from a bed to a chair (including a wheelchair)?: Total Help needed standing up from a chair using your arms (e.g., wheelchair or bedside chair)?: Total Help needed to walk in hospital room?: Total Help needed climbing 3-5 steps with a railing? : Total 6 Click Score: 7    End of Session Equipment Utilized During Treatment: Gait belt;Oxygen Activity Tolerance: Patient limited by fatigue Patient left: in chair;with call bell/phone within reach;with bed alarm set Nurse Communication: Mobility status PT Visit Diagnosis: Other abnormalities of gait and mobility (R26.89);Muscle weakness (generalized) (M62.81)     Time: 2536-6440 PT Time Calculation (min) (ACUTE ONLY): 18 min  Charges:    $Therapeutic Activity: 8-22 mins PT General Charges $$ ACUTE PT VISIT: 1 Visit                     Kyle [name] R. PTA Acute  Rehabilitation Services Office: [phone]   Catalina Antigua 09/09/2023, 1:03 PM

## 2023-09-09 NOTE — Progress Notes (Addendum)
STROKE TEAM PROGRESS NOTE   BRIEF HPI Mr. RITIK WHITEHORN is a 72 y.o. male with history of dilated cardiomyopathy, chronic respiratory failure on 4.5L baseline, hx of fem pop bypass in 2010, an aortobifemoral bypass in 2016 presenting with right sided weakness. CT showed a left thalamic hemorrhage. He was given Kcentra for eliquis reversal in the ED. CT surgery consulted and recommend BP control less than 140 for aortic management. Also found to have a large right pseudoaneurysm and VVS has been consulted. Plan for intervention after discharge.  SIGNIFICANT HOSPITAL EVENTS 11/10- admit to ICU due to [facility] 11/11- VVS consult for pseudoaneurysm   INTERIM HISTORY/SUBJECTIVE Patient was transferred out of the ICU yesterday.  He has been hemodynamically stable, and his neurological exam remains stable. OBJECTIVE  CBC    Component Value Date/Time   WBC 7.5 09/09/2023 0855   RBC 4.65 09/09/2023 0855   HGB 13.9 09/09/2023 0855   HGB 14.8 02/12/2022 0943   HCT 42.7 09/09/2023 0855   HCT 45.0 02/12/2022 0943   PLT 162 09/09/2023 0855   PLT 174 02/12/2022 0943   MCV 91.8 09/09/2023 0855   MCV 95 02/12/2022 0943   MCH 29.9 09/09/2023 0855   MCHC 32.6 09/09/2023 0855   RDW 15.6 (H) 09/09/2023 0855   RDW 15.0 02/12/2022 0943   LYMPHSABS 1.8 09/06/2023 0514   LYMPHSABS 1.5 02/12/2022 0943   MONOABS 0.9 09/06/2023 0514   EOSABS 0.1 09/06/2023 0514   EOSABS 0.1 02/12/2022 0943   BASOSABS 0.0 09/06/2023 0514   BASOSABS 0.0 02/12/2022 0943    BMET    Component Value Date/Time   NA 137 09/09/2023 0855   NA 145 (H) 02/03/2023 0927   K 3.9 09/09/2023 0855   CL 105 09/09/2023 0855   CO2 23 09/09/2023 0855   GLUCOSE 122 (H) 09/09/2023 0855   BUN 23 09/09/2023 0855   BUN 22 02/03/2023 0927   CREATININE 2.12 (H) 09/09/2023 0855   CREATININE 0.99 11/12/2014 1344   CALCIUM 8.6 (L) 09/09/2023 0855   EGFR 43 (L) 02/03/2023 0927   GFRNONAA 33 (L) 09/09/2023 0855    IMAGING past 24 hours No  results found.  Vitals:   09/08/23 2336 09/09/23 0401 09/09/23 0811 09/09/23 1149  BP: (!) 132/57 (!) 141/65 (!) 141/66 (!) 121/54  Pulse: 66 83 78 75  Resp: 18 18 18 18   Temp: 98.6 F (37 C) 98.3 F (36.8 C) 98.2 F (36.8 C) 97.9 F (36.6 C)  TempSrc: Oral Oral Oral Axillary  SpO2: 97% 98% 100%   Weight:      Height:         PHYSICAL EXAM General:  Alert, well-nourished, well-developed patient in no acute distress Psych:  Mood and affect appropriate for situation CV: Regular rate and rhythm on monitor Respiratory:  Regular, coarse, unlabored respirations 10L, [place]  NEURO:  Mental Status: AA&Ox3, patient is able to give clear and coherent history Speech/Language: speech is dysarthric  Cranial Nerves:  II: PERRL. Visual fields full.  III, IV, VI: EOMI. Eyelids elevate symmetrically.  V: Sensation is intact to light touch and symmetrical to face.  VII: Right facial droop VIII: hearing intact to voice. IX, X: Voice is dysarthric WN:[id number] shrug 5/5. XII: tongue is midline without fasciculations. Motor:  RUE- 0/5 RLE - 0/5 LUE and LLE with 5/5 movemet No movement distal with toes Tone: is normal and bulk is normal Sensation- Intact to light touch bilaterally.    Coordination: FTN intact on the left.No  drift.  Gait- deferred  ASSESSMENT/PLAN  ICH:  left BG ICH s/p kcentra, etiology: Likely uncontrolled HTN in the setting of Eliquis use Code Stroke CT head  Positive for Acute 7 mL hemorrhage in the left lentiform nuclei.   CTA head & neck No CTA spot sign or vascular malformation at the left lentiform hemorrhage MRI  Unchanged acute hemorrhage centered within the left lentiform nuclei with mild surrounding edema and mild mass effect on the left lateral ventricle. 2D Echo EF 55 to 60% LDL 61 HgbA1c 6.2 UDS negative VTE prophylaxis - SCDs Eliquis (apixaban) daily prior to admission, now on no anticoagulation due to ICH  Therapy recommendations:  CIR Disposition:   Pending   Large right groin pseudoaneurysm  CTA Chest/Abd/Pel- status post aorto bi-iliac bypass graft. Both limbs of the graft are widely patent. 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  VVS consulted, likely chronic, plan to repair in 1 month as outpatient. No pain in right groin, pulses palpable  BP goal less than 140  Aortic atherosclerosis CTA neck  - bulky atherosclerotic soft plaque with multiple ulcerations, appearance suspicious for developing intramural hematoma, impending dissection  CTA Chest/Abd/Pel- Extensive aortic atherosclerosis with multiple penetrating ulcers and ulcerated plaques. 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 CTS consulted, no indication for intervention BP goal less than 140  Atrial fibrillation Home Meds: eliquis  Reversed with ? Kcentra Hold off AC at this moment Consider to resume AC in 2 weeks if neuro stable and BP under control  COPD  Chronic respiratory failure  on 4.5L [place] at home Now on 10L->6L, wean down O2 if O2 sat > 90 Follows with pulmonology outpatient  CXR vascular congestion On lasix 20mg  daily  S/p IV 60mg  Lasix x 2  Hypertension Congestive heart failure  Dilated cardiomyopathy Home meds:  Norvasc, coreg, imdur, hydralazine, lasix BP stable now PRN labetalol and hydralazine Now on amlodipine 10, coreg 6.25 bid, imdur 30, hydralazine 25->100 tid, lasix 20, clonidine 0.1->0.2 mg 3 times daily Blood Pressure Goal: SBP less than 140  Hyperlipidemia Home meds: crestor 10mg  LDL 61 Resume home Crestor 10 No high intensity statin given LDL at goal Continue statin on discharge  Dysphagia Patient has post-stroke dysphagia, SLP consulted On full liquid diet with nectar thick Advance diet as  tolerated  Other Active Problems Hx of seizure- continue keppra 500mg  BID  Hospital day # 3   Patient seen and examined by NP/APP with MD. MD to update note as needed.   Cortney E Ernestina Columbia , MSN, AGACNP-BC [facility] See Amion for schedule and pager information 09/09/2023 3:24 PM  ATTENDING NOTE: I reviewed above note and agree with the assessment and plan. Pt was seen and examined.   Patient lying in bed, no family at bedside.  Patient breathing much improved from last 2 days, now able to speak in full sentence.  Oxygen down from 10L to 6L with O2 sats at 91 to 92%.  Baseline home oxygen 4.5 L.  BP stable, at goal, continue current management.  PT and OT recommend CIR.  For detailed assessment and plan, please refer to above/below as I have made changes wherever appropriate.   Marvel Plan, MD PhD Stroke Neurology 09/09/2023 7:23 PM    To contact Stroke Continuity provider, please refer to [email or web address]. After hours,  contact General Neurology

## 2023-09-10 ENCOUNTER — Inpatient Hospital Stay (HOSPITAL_COMMUNITY): Payer: Medicare HMO

## 2023-09-10 ENCOUNTER — Encounter (HOSPITAL_COMMUNITY): Payer: Self-pay | Admitting: Physical Medicine & Rehabilitation

## 2023-09-10 ENCOUNTER — Encounter (HOSPITAL_COMMUNITY): Payer: Self-pay | Admitting: Neurology

## 2023-09-10 ENCOUNTER — Inpatient Hospital Stay (HOSPITAL_COMMUNITY)
Admission: AD | Admit: 2023-09-10 | Discharge: 2023-10-06 | DRG: 057 | Disposition: A | Discharge status: Skilled Nursing Facility | Payer: Medicare HMO | Source: Intra-hospital | Attending: Physical Medicine & Rehabilitation | Admitting: Physical Medicine & Rehabilitation

## 2023-09-10 ENCOUNTER — Other Ambulatory Visit: Payer: Self-pay

## 2023-09-10 DIAGNOSIS — Z8249 Family history of ischemic heart disease and other diseases of the circulatory system: Secondary | ICD-10-CM | POA: Diagnosis not present

## 2023-09-10 DIAGNOSIS — R262 Difficulty in walking, not elsewhere classified: Secondary | ICD-10-CM | POA: Diagnosis not present

## 2023-09-10 DIAGNOSIS — I610 Nontraumatic intracerebral hemorrhage in hemisphere, subcortical: Secondary | ICD-10-CM | POA: Diagnosis not present

## 2023-09-10 DIAGNOSIS — L8990 Pressure ulcer of unspecified site, unspecified stage: Secondary | ICD-10-CM | POA: Insufficient documentation

## 2023-09-10 DIAGNOSIS — I69151 Hemiplegia and hemiparesis following nontraumatic intracerebral hemorrhage affecting right dominant side: Secondary | ICD-10-CM

## 2023-09-10 DIAGNOSIS — J449 Chronic obstructive pulmonary disease, unspecified: Secondary | ICD-10-CM | POA: Diagnosis present

## 2023-09-10 DIAGNOSIS — K5900 Constipation, unspecified: Secondary | ICD-10-CM | POA: Diagnosis present

## 2023-09-10 DIAGNOSIS — Z833 Family history of diabetes mellitus: Secondary | ICD-10-CM | POA: Diagnosis not present

## 2023-09-10 DIAGNOSIS — R0602 Shortness of breath: Secondary | ICD-10-CM | POA: Diagnosis present

## 2023-09-10 DIAGNOSIS — N183 Chronic kidney disease, stage 3 unspecified: Secondary | ICD-10-CM | POA: Diagnosis present

## 2023-09-10 DIAGNOSIS — R208 Other disturbances of skin sensation: Secondary | ICD-10-CM | POA: Diagnosis not present

## 2023-09-10 DIAGNOSIS — I724 Aneurysm of artery of lower extremity: Secondary | ICD-10-CM | POA: Diagnosis present

## 2023-09-10 DIAGNOSIS — F32A Depression, unspecified: Secondary | ICD-10-CM | POA: Diagnosis not present

## 2023-09-10 DIAGNOSIS — I482 Chronic atrial fibrillation, unspecified: Secondary | ICD-10-CM | POA: Diagnosis not present

## 2023-09-10 DIAGNOSIS — J4489 Other specified chronic obstructive pulmonary disease: Secondary | ICD-10-CM | POA: Diagnosis present

## 2023-09-10 DIAGNOSIS — I729 Aneurysm of unspecified site: Secondary | ICD-10-CM | POA: Insufficient documentation

## 2023-09-10 DIAGNOSIS — E1151 Type 2 diabetes mellitus with diabetic peripheral angiopathy without gangrene: Secondary | ICD-10-CM | POA: Diagnosis present

## 2023-09-10 DIAGNOSIS — E46 Unspecified protein-calorie malnutrition: Secondary | ICD-10-CM | POA: Diagnosis not present

## 2023-09-10 DIAGNOSIS — Z9981 Dependence on supplemental oxygen: Secondary | ICD-10-CM

## 2023-09-10 DIAGNOSIS — Z83438 Family history of other disorder of lipoprotein metabolism and other lipidemia: Secondary | ICD-10-CM | POA: Diagnosis not present

## 2023-09-10 DIAGNOSIS — I110 Hypertensive heart disease with heart failure: Secondary | ICD-10-CM | POA: Diagnosis not present

## 2023-09-10 DIAGNOSIS — E1122 Type 2 diabetes mellitus with diabetic chronic kidney disease: Secondary | ICD-10-CM | POA: Diagnosis present

## 2023-09-10 DIAGNOSIS — Z87891 Personal history of nicotine dependence: Secondary | ICD-10-CM | POA: Diagnosis not present

## 2023-09-10 DIAGNOSIS — I5022 Chronic systolic (congestive) heart failure: Secondary | ICD-10-CM | POA: Diagnosis not present

## 2023-09-10 DIAGNOSIS — F54 Psychological and behavioral factors associated with disorders or diseases classified elsewhere: Secondary | ICD-10-CM | POA: Diagnosis not present

## 2023-09-10 DIAGNOSIS — I700 Atherosclerosis of aorta: Secondary | ICD-10-CM | POA: Diagnosis present

## 2023-09-10 DIAGNOSIS — G40901 Epilepsy, unspecified, not intractable, with status epilepticus: Secondary | ICD-10-CM | POA: Diagnosis not present

## 2023-09-10 DIAGNOSIS — Z7401 Bed confinement status: Secondary | ICD-10-CM | POA: Diagnosis not present

## 2023-09-10 DIAGNOSIS — G936 Cerebral edema: Secondary | ICD-10-CM | POA: Diagnosis not present

## 2023-09-10 DIAGNOSIS — I619 Nontraumatic intracerebral hemorrhage, unspecified: Secondary | ICD-10-CM | POA: Diagnosis not present

## 2023-09-10 DIAGNOSIS — M25551 Pain in right hip: Secondary | ICD-10-CM | POA: Insufficient documentation

## 2023-09-10 DIAGNOSIS — E119 Type 2 diabetes mellitus without complications: Secondary | ICD-10-CM

## 2023-09-10 DIAGNOSIS — I130 Hypertensive heart and chronic kidney disease with heart failure and stage 1 through stage 4 chronic kidney disease, or unspecified chronic kidney disease: Secondary | ICD-10-CM | POA: Diagnosis present

## 2023-09-10 DIAGNOSIS — J439 Emphysema, unspecified: Secondary | ICD-10-CM | POA: Diagnosis present

## 2023-09-10 DIAGNOSIS — N179 Acute kidney failure, unspecified: Secondary | ICD-10-CM | POA: Diagnosis not present

## 2023-09-10 DIAGNOSIS — I272 Pulmonary hypertension, unspecified: Secondary | ICD-10-CM | POA: Diagnosis not present

## 2023-09-10 DIAGNOSIS — I420 Dilated cardiomyopathy: Secondary | ICD-10-CM | POA: Diagnosis present

## 2023-09-10 DIAGNOSIS — R41841 Cognitive communication deficit: Secondary | ICD-10-CM | POA: Diagnosis not present

## 2023-09-10 DIAGNOSIS — Z79899 Other long term (current) drug therapy: Secondary | ICD-10-CM

## 2023-09-10 DIAGNOSIS — Z794 Long term (current) use of insulin: Secondary | ICD-10-CM | POA: Diagnosis not present

## 2023-09-10 DIAGNOSIS — R1310 Dysphagia, unspecified: Secondary | ICD-10-CM

## 2023-09-10 DIAGNOSIS — J9611 Chronic respiratory failure with hypoxia: Secondary | ICD-10-CM | POA: Diagnosis not present

## 2023-09-10 DIAGNOSIS — G40909 Epilepsy, unspecified, not intractable, without status epilepticus: Secondary | ICD-10-CM | POA: Diagnosis present

## 2023-09-10 DIAGNOSIS — K5901 Slow transit constipation: Secondary | ICD-10-CM | POA: Diagnosis not present

## 2023-09-10 DIAGNOSIS — I6381 Other cerebral infarction due to occlusion or stenosis of small artery: Secondary | ICD-10-CM

## 2023-09-10 DIAGNOSIS — Z7409 Other reduced mobility: Secondary | ICD-10-CM | POA: Diagnosis present

## 2023-09-10 DIAGNOSIS — I69191 Dysphagia following nontraumatic intracerebral hemorrhage: Secondary | ICD-10-CM | POA: Diagnosis not present

## 2023-09-10 DIAGNOSIS — I69122 Dysarthria following nontraumatic intracerebral hemorrhage: Principal | ICD-10-CM

## 2023-09-10 DIAGNOSIS — Z7901 Long term (current) use of anticoagulants: Secondary | ICD-10-CM

## 2023-09-10 DIAGNOSIS — J410 Simple chronic bronchitis: Secondary | ICD-10-CM

## 2023-09-10 DIAGNOSIS — M6281 Muscle weakness (generalized): Secondary | ICD-10-CM | POA: Diagnosis not present

## 2023-09-10 DIAGNOSIS — E1159 Type 2 diabetes mellitus with other circulatory complications: Secondary | ICD-10-CM | POA: Diagnosis present

## 2023-09-10 DIAGNOSIS — N1832 Chronic kidney disease, stage 3b: Secondary | ICD-10-CM | POA: Diagnosis not present

## 2023-09-10 DIAGNOSIS — I10 Essential (primary) hypertension: Secondary | ICD-10-CM | POA: Diagnosis not present

## 2023-09-10 DIAGNOSIS — N1831 Chronic kidney disease, stage 3a: Secondary | ICD-10-CM | POA: Diagnosis present

## 2023-09-10 DIAGNOSIS — M792 Neuralgia and neuritis, unspecified: Secondary | ICD-10-CM

## 2023-09-10 DIAGNOSIS — I639 Cerebral infarction, unspecified: Secondary | ICD-10-CM | POA: Diagnosis not present

## 2023-09-10 DIAGNOSIS — E785 Hyperlipidemia, unspecified: Secondary | ICD-10-CM | POA: Diagnosis not present

## 2023-09-10 DIAGNOSIS — I5021 Acute systolic (congestive) heart failure: Secondary | ICD-10-CM | POA: Diagnosis not present

## 2023-09-10 HISTORY — DX: Nontraumatic intracerebral hemorrhage, unspecified: I61.9

## 2023-09-10 LAB — BASIC METABOLIC PANEL
Anion gap: 9 (ref 5–15)
BUN: 29 mg/dL — ABNORMAL HIGH (ref 8–23)
CO2: 23 mmol/L (ref 22–32)
Calcium: 8.7 mg/dL — ABNORMAL LOW (ref 8.9–10.3)
Chloride: 109 mmol/L (ref 98–111)
Creatinine, Ser: 1.96 mg/dL — ABNORMAL HIGH (ref 0.61–1.24)
GFR, Estimated: 36 mL/min — ABNORMAL LOW (ref >60)
Glucose, Bld: 124 mg/dL — ABNORMAL HIGH (ref 70–99)
Potassium: 4 mmol/L (ref 3.5–5.1)
Sodium: 141 mmol/L (ref 135–145)

## 2023-09-10 LAB — CBC
HCT: 38.7 % — ABNORMAL LOW (ref 39.0–52.0)
Hemoglobin: 12.4 g/dL — ABNORMAL LOW (ref 13.0–17.0)
MCH: 29.4 pg (ref 26.0–34.0)
MCHC: 32 g/dL (ref 30.0–36.0)
MCV: 91.7 fL (ref 80.0–100.0)
Platelets: 153 10*3/uL (ref 150–400)
RBC: 4.22 MIL/uL (ref 4.22–5.81)
RDW: 15.4 % (ref 11.5–15.5)
WBC: 8 10*3/uL (ref 4.0–10.5)
nRBC: 0 % (ref 0.0–0.2)

## 2023-09-10 LAB — GLUCOSE, CAPILLARY
Glucose-Capillary: 158 mg/dL — ABNORMAL HIGH (ref 70–99)
Glucose-Capillary: 138 mg/dL — ABNORMAL HIGH (ref 70–99)

## 2023-09-10 MED ORDER — FLUTICASONE FUROATE-VILANTEROL 200-25 MCG/ACT IN AEPB: 1.0000 | INHALATION_SPRAY | Freq: Every day | RESPIRATORY_TRACT | Status: DC

## 2023-09-10 MED ORDER — DICLOFENAC SODIUM 1 % EX GEL
2.0000 g | Freq: Four times a day (QID) | CUTANEOUS | Status: DC
  Administered 2023-09-11 – 2023-10-06 (×75): 2 g via TOPICAL
  Filled 2023-09-10 (×2): qty 100

## 2023-09-10 MED ORDER — UMECLIDINIUM BROMIDE 62.5 MCG/ACT IN AEPB
1.0000 | INHALATION_SPRAY | Freq: Every day | RESPIRATORY_TRACT | Status: DC
  Administered 2023-09-17 – 2023-10-06 (×18): 1 via RESPIRATORY_TRACT
  Filled 2023-09-10 (×3): qty 7

## 2023-09-10 MED ORDER — UMECLIDINIUM BROMIDE 62.5 MCG/ACT IN AEPB
1.0000 | INHALATION_SPRAY | Freq: Every day | RESPIRATORY_TRACT | Status: DC
  Filled 2023-09-10: qty 7

## 2023-09-10 MED ORDER — ISOSORBIDE MONONITRATE ER 60 MG PO TB24
30.0000 mg | ORAL_TABLET | Freq: Every day | ORAL | Status: DC
  Administered 2023-09-11 – 2023-10-06 (×26): 30 mg via ORAL
  Filled 2023-09-10 (×26): qty 1

## 2023-09-10 MED ORDER — ACETAMINOPHEN 325 MG PO TABS: 650.0000 mg | ORAL_TABLET | ORAL | Status: DC | PRN

## 2023-09-10 MED ORDER — PROCHLORPERAZINE MALEATE 5 MG PO TABS
5.0000 mg | ORAL_TABLET | Freq: Four times a day (QID) | ORAL | Status: DC | PRN
Start: 2023-09-10 — End: 2023-10-06

## 2023-09-10 MED ORDER — CLONIDINE HCL 0.2 MG PO TABS: 0.2000 mg | ORAL_TABLET | Freq: Three times a day (TID) | ORAL | Status: DC

## 2023-09-10 MED ORDER — LEVETIRACETAM 250 MG PO TABS
500.0000 mg | ORAL_TABLET | Freq: Two times a day (BID) | ORAL | Status: DC
  Administered 2023-09-10 – 2023-10-06 (×52): 500 mg via ORAL
  Filled 2023-09-10 (×52): qty 2

## 2023-09-10 MED ORDER — ROSUVASTATIN CALCIUM 5 MG PO TABS
10.0000 mg | ORAL_TABLET | Freq: Every day | ORAL | Status: DC
  Administered 2023-09-10 – 2023-10-05 (×26): 10 mg via ORAL
  Filled 2023-09-10 (×26): qty 2

## 2023-09-10 MED ORDER — AMLODIPINE BESYLATE 10 MG PO TABS: 10.0000 mg | ORAL_TABLET | Freq: Every day | ORAL | Status: DC

## 2023-09-10 MED ORDER — INSULIN ASPART 100 UNIT/ML IJ SOLN
0.0000 [IU] | Freq: Every day | INTRAMUSCULAR | Status: DC
  Administered 2023-09-13: 2 [IU] via SUBCUTANEOUS

## 2023-09-10 MED ORDER — UMECLIDINIUM BROMIDE 62.5 MCG/ACT IN AEPB: 1.0000 | INHALATION_SPRAY | Freq: Every day | RESPIRATORY_TRACT | Status: DC

## 2023-09-10 MED ORDER — CLONIDINE HCL 0.1 MG PO TABS
0.2000 mg | ORAL_TABLET | Freq: Three times a day (TID) | ORAL | Status: DC
  Administered 2023-09-10 – 2023-10-06 (×77): 0.2 mg via ORAL
  Filled 2023-09-10 (×78): qty 2

## 2023-09-10 MED ORDER — CARVEDILOL 6.25 MG PO TABS
6.2500 mg | ORAL_TABLET | Freq: Two times a day (BID) | ORAL | Status: DC
  Administered 2023-09-10 – 2023-10-06 (×52): 6.25 mg via ORAL
  Filled 2023-09-10 (×52): qty 1

## 2023-09-10 MED ORDER — FUROSEMIDE 20 MG PO TABS
20.0000 mg | ORAL_TABLET | Freq: Every day | ORAL | Status: DC
  Administered 2023-09-11 – 2023-09-15 (×5): 20 mg via ORAL
  Filled 2023-09-10 (×5): qty 1

## 2023-09-10 MED ORDER — AMLODIPINE BESYLATE 10 MG PO TABS
10.0000 mg | ORAL_TABLET | Freq: Every day | ORAL | Status: DC
  Administered 2023-09-11 – 2023-10-06 (×26): 10 mg via ORAL
  Filled 2023-09-10 (×26): qty 1

## 2023-09-10 MED ORDER — HYDRALAZINE HCL 100 MG PO TABS: 100.0000 mg | ORAL_TABLET | Freq: Three times a day (TID) | ORAL | Status: DC

## 2023-09-10 MED ORDER — PANTOPRAZOLE SODIUM 40 MG PO TBEC
40.0000 mg | DELAYED_RELEASE_TABLET | Freq: Every day | ORAL | Status: DC
  Administered 2023-09-11 – 2023-10-06 (×26): 40 mg via ORAL
  Filled 2023-09-10 (×26): qty 1

## 2023-09-10 MED ORDER — HYDRALAZINE HCL 50 MG PO TABS
100.0000 mg | ORAL_TABLET | Freq: Three times a day (TID) | ORAL | Status: DC
  Administered 2023-09-10 – 2023-10-06 (×77): 100 mg via ORAL
  Filled 2023-09-10 (×77): qty 2

## 2023-09-10 MED ORDER — LEVETIRACETAM 500 MG PO TABS: 500.0000 mg | ORAL_TABLET | Freq: Two times a day (BID) | ORAL | Status: DC

## 2023-09-10 MED ORDER — FLUTICASONE FUROATE-VILANTEROL 200-25 MCG/ACT IN AEPB
1.0000 | INHALATION_SPRAY | Freq: Every day | RESPIRATORY_TRACT | Status: DC
  Administered 2023-09-13 – 2023-10-06 (×17): 1 via RESPIRATORY_TRACT
  Filled 2023-09-10 (×2): qty 28

## 2023-09-10 MED ORDER — ALBUTEROL SULFATE (2.5 MG/3ML) 0.083% IN NEBU: 3.0000 mL | INHALATION_SOLUTION | Freq: Four times a day (QID) | RESPIRATORY_TRACT | Status: DC | PRN

## 2023-09-10 MED ORDER — ORAL CARE MOUTH RINSE: 15.0000 mL | OROMUCOSAL | Status: DC | PRN

## 2023-09-10 MED ORDER — DIPHENHYDRAMINE HCL 25 MG PO CAPS: 25.0000 mg | ORAL_CAPSULE | Freq: Four times a day (QID) | ORAL | Status: DC | PRN

## 2023-09-10 MED ORDER — BISACODYL 10 MG RE SUPP
10.0000 mg | Freq: Every day | RECTAL | Status: DC | PRN
  Administered 2023-09-22: 10 mg via RECTAL
  Filled 2023-09-10: qty 1

## 2023-09-10 MED ORDER — FLUTICASONE FUROATE-VILANTEROL 200-25 MCG/ACT IN AEPB
1.0000 | INHALATION_SPRAY | Freq: Every day | RESPIRATORY_TRACT | Status: DC
  Filled 2023-09-10: qty 28

## 2023-09-10 MED ORDER — HYDRALAZINE HCL 25 MG PO TABS
25.0000 mg | ORAL_TABLET | Freq: Four times a day (QID) | ORAL | Status: DC | PRN
  Administered 2023-09-13 – 2023-10-02 (×2): 25 mg via ORAL
  Filled 2023-09-10 (×2): qty 1

## 2023-09-10 MED ORDER — FLEET ENEMA RE ENEM: 1.0000 | ENEMA | Freq: Once | RECTAL | Status: DC | PRN

## 2023-09-10 MED ORDER — GUAIFENESIN-DM 100-10 MG/5ML PO SYRP: 5.0000 mL | ORAL_SOLUTION | Freq: Four times a day (QID) | ORAL | Status: DC | PRN

## 2023-09-10 MED ORDER — ACETAMINOPHEN 325 MG PO TABS
325.0000 mg | ORAL_TABLET | ORAL | Status: DC | PRN
Start: 2023-09-10 — End: 2023-09-27
  Administered 2023-09-20 – 2023-09-27 (×2): 650 mg via ORAL
  Filled 2023-09-10 (×3): qty 2

## 2023-09-10 MED ORDER — PROCHLORPERAZINE EDISYLATE 10 MG/2ML IJ SOLN: 5.0000 mg | Freq: Four times a day (QID) | INTRAMUSCULAR | Status: DC | PRN

## 2023-09-10 MED ORDER — PROCHLORPERAZINE 25 MG RE SUPP: 12.5000 mg | Freq: Four times a day (QID) | RECTAL | Status: DC | PRN

## 2023-09-10 MED ORDER — CHLORHEXIDINE GLUCONATE CLOTH 2 % EX PADS
6.0000 | MEDICATED_PAD | Freq: Every day | CUTANEOUS | Status: DC
  Administered 2023-09-11: 6 via TOPICAL

## 2023-09-10 MED ORDER — INSULIN ASPART 100 UNIT/ML IJ SOLN
0.0000 [IU] | Freq: Three times a day (TID) | INTRAMUSCULAR | Status: DC
  Administered 2023-09-10 – 2023-09-11 (×2): 1 [IU] via SUBCUTANEOUS
  Administered 2023-09-11: 2 [IU] via SUBCUTANEOUS
  Administered 2023-09-12 – 2023-09-15 (×5): 1 [IU] via SUBCUTANEOUS

## 2023-09-10 MED ORDER — ALUM & MAG HYDROXIDE-SIMETH 200-200-20 MG/5ML PO SUSP: 30.0000 mL | ORAL | Status: DC | PRN

## 2023-09-10 MED ORDER — SENNOSIDES-DOCUSATE SODIUM 8.6-50 MG PO TABS
2.0000 | ORAL_TABLET | Freq: Every day | ORAL | Status: DC
  Administered 2023-09-11 – 2023-09-26 (×16): 2 via ORAL
  Filled 2023-09-10 (×16): qty 2

## 2023-09-10 MED ORDER — TRAZODONE HCL 50 MG PO TABS
25.0000 mg | ORAL_TABLET | Freq: Every evening | ORAL | Status: DC | PRN
  Administered 2023-09-18 – 2023-10-02 (×4): 50 mg via ORAL
  Filled 2023-09-10 (×4): qty 1

## 2023-09-10 NOTE — H&P (Addendum)
Physical Medicine and Rehabilitation Admission H&P        Chief Complaint  Patient presents with   Code Stroke      HPI: Kyle [name] is a 72 year old male with history of COPD- oxygen dependent, bilateral lung nodules, CM w/systolic CHF, CKD, PAD, pulmonary HTN, A fib-on Eliquis who was admitted on 09/05/23 after falling on the floor while trying to get out of bed in morning. 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 He was noted to have dysarthric speech with right facial droop and nonfluent speech. MRI brain done revealing unchanged acute left lentiform nucleus hemorrhage with mild edema and mild mass effect on left lateral ventricle. Neurology recommended SBP 130-150 range with repeat CT to monitor for stability. Eliquis reversed with Kcentra. 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 Cliffton Asters evaluated films and felt there no obvious dissection or intramural hematoma noted.    Dr. Robbins/VVS consulted and did not felt that pseudoaneurysm was new. Patient without pain, well perfused right foot with palpable DP pulse and recommended heparin drip when able and surgery within a month if patient able to be anticoagulated. SBP goal <140 per vascular. He was weaned off Cleviprex and Dr. Roda Shutters recommends resuming AC in 2 weeks if neuro and BP stable. He has had high oxygen needs up to 12 liters and was weaned down to  6 L yesterday.          Review of  Systems  Constitutional: Negative.   HENT: Negative.    Eyes: Negative.   Respiratory: Negative.    Cardiovascular: Negative.   Gastrointestinal: Negative.   Genitourinary: Negative.   Musculoskeletal: Negative.   Skin:  Positive for itching.  Neurological:  Positive for tingling, sensory change and focal weakness. Dizziness: Right arm sensitive to touch. Psychiatric/Behavioral:  Negative for depression and suicidal ideas.             Past Medical History:  Diagnosis Date   Aortic insufficiency      MODERATE WITH A BICUSPID AORTIC VAVLE   Arterial occlusive disease      MULTILEVEL   CHF (congestive heart failure) (HCC)     Chronic bronchitis (HCC)     CKD (chronic kidney disease) stage 3, GFR 30-59 ml/min (HCC)     COPD (chronic obstructive pulmonary disease) (HCC)     Dilated cardiomyopathy (HCC)      WITH EJECTION FRACTION DOWN TO 20-25%--WITH CONGESTIVE HEART FAILURE   Edema      LOWER EXTREMETIES   Emphysema of lung (HCC)     Hyperglycemia 01/2020   Hypertension     Normal coronary arteries 2009   Orthopnea     Peripheral arterial disease (HCC)     Pulmonary hypertension (HCC)                 Past Surgical History:  Procedure Laterality Date   ABDOMINAL AORTAGRAM N/A 11/05/2014    Procedure: ABDOMINAL Ronny Flurry;  Surgeon: Chuck Hint, MD;  Location: [facility] CATH LAB;  Service: Cardiovascular;  Laterality: N/A;   ABDOMINAL AORTOGRAM W/LOWER EXTREMITY Bilateral 04/21/2019    Procedure: ABDOMINAL AORTOGRAM W/LOWER EXTREMITY;  Surgeon: Chuck Hint, MD;  Location[facility] INVASIVE CV LAB;  Service: Cardiovascular;  Laterality: Bilateral;   ABDOMINAL AORTOGRAM W/LOWER EXTREMITY Right 05/30/2021    Procedure: ABDOMINAL AORTOGRAM W/LOWER EXTREMITY;  Surgeon: Chuck Hint, MD;  Location: [facility] INVASIVE CV LAB;  Service: Cardiovascular;  Laterality: Right;   AORTA - BILATERAL FEMORAL ARTERY BYPASS GRAFT N/A 12/18/2014    Procedure: AORTOBIFEMORAL BYPASS  GRAFT;  Surgeon: Chuck Hint, MD;  Location: [facility] OR;  Service: Vascular;  Laterality: N/A;   CARDIAC CATHETERIZATION   2009    Nl Cors, EF 20%   COLONOSCOPY       ENDARTERECTOMY FEMORAL Left 12/18/2014    Procedure: ENDARTERECTOMY FEMORAL;  Surgeon: Chuck Hint, MD;  Location: [facility] OR;  Service: Vascular;  Laterality: Left;   FALSE ANEURYSM REPAIR Right 04/24/2019    Procedure: REPAIR OF RIGHT FEMORAL ARTERY PSEUDOANEURYSM;  Surgeon: Larina Earthly, MD;  Location: [facility] OR;  Service: Vascular;  Laterality: Right;   FEMORAL-POPLITEAL BYPASS GRAFT   02/21/2011    right   FEMORAL-TIBIAL BYPASS GRAFT Right 04/24/2019    Procedure: REDO BYPASS GRAFT RIGHT FEMORAL-TIBIAL ARTERY USING LEFT LEG VEIN & HEMASHIELD GOLD 8mm GRAFT;  Surgeon: Larina Earthly, MD;  Location: [facility] OR;  Service: Vascular;  Laterality: Right;   IR FLUORO GUIDE CV LINE RIGHT   12/28/2019   IR REMOVAL TUN CV CATH W/O FL   01/11/2020   IR US GUIDE VASC ACCESS RIGHT   12/28/2019   OTHER SURGICAL HISTORY        POST RIGHT FEMOROPOPLITEAL BYPASS GRAFT   PERIPHERAL VASCULAR CATHETERIZATION   11/05/2014    Procedure: LOWER EXTREMITY ANGIOGRAPHY;  Surgeon: Chuck Hint, MD;  Location: [facility] CATH LAB;  Service: Cardiovascular;;   VEIN HARVEST Left 04/24/2019    Procedure: LEFT LEG SAPHENOUS VEIN HARVEST;  Surgeon: Larina Earthly, MD;  Location: [facility] OR;  Service: Vascular;  Laterality: Left;               Family History  Problem Relation Age of Onset   Diabetes Mother     Hyperlipidemia Sister     Hypertension Sister            Social History:  reports that he quit smoking about 4 years ago. His smoking use included cigarettes. He started smoking about 44 years ago. He has a 40 pack-year smoking history. He has never been exposed to tobacco smoke. He has never used smokeless tobacco. He reports that he does not drink alcohol and does not use drugs.     Allergies:  Allergies  No Known Allergies              Medications Prior to Admission  Medication Sig Dispense Refill   albuterol (VENTOLIN HFA) 108 (90 Base) MCG/ACT inhaler Inhale 2 puffs into the lungs every 6 (six) hours as needed for wheezing or shortness of breath. 8 g 2   amLODipine (NORVASC) 5 MG tablet Take 1 tablet (5 mg total) by mouth daily. 90 tablet 0   apixaban (ELIQUIS) 5 MG TABS tablet TAKE 1 TABLET TWICE DAILY 180 tablet 1   carvedilol (COREG) 6.25 MG tablet TAKE 1 TABLET TWICE DAILY WITH MEALS (NEED MD APPOINTMENT) 180 tablet 3   Fluticasone-Umeclidin-Vilant (TRELEGY ELLIPTA) 200-62.5-25 MCG/ACT AEPB Inhale 1 puff into the lungs  daily. 180 each 3   furosemide (LASIX) 40 MG tablet TAKE 1/2 TABLET EVERY DAY 45 tablet 3   guaiFENesin (MUCINEX) 600 MG 12 hr tablet Take 2 tablets (1,200 mg total) by mouth 2 (two) times daily. 30 tablet 0   hydrALAZINE (APRESOLINE) 25 MG tablet TAKE 1 TABLET THREE TIMES DAILY (NEED APPOINTMENT WITH PCP FOR REFILLS) (Patient taking differently: Take 25 mg by mouth 3 (three) times daily. (NEED APPOINTMENT WITH PCP FOR REFILLS)) 90 tablet 0   insulin degludec (TRESIBA FLEXTOUCH) 100 UNIT/ML FlexTouch Pen Inject 14 Units into the skin daily. 3 mL 1   isosorbide mononitrate (IMDUR) 30 MG 24 hr tablet TAKE 1 TABLET BY MOUTH ONCE DAILY. 90 tablet 1   levETIRAcetam (KEPPRA) 500 MG tablet Take 1 tablet (500 mg total) by mouth daily. 60 tablet 0   Multiple Vitamins-Minerals (CENTRUM SILVER 50+MEN) TABS Take 1 tablet by mouth daily with breakfast.       NOVOLOG FLEXPEN 100 UNIT/ML FlexPen INJECT 5 UNITS SUBCUTANEOUSLY THREE TIMES DAILY WITH MEALS 3 mL 0   OXYGEN Inhale 4.5 L/min into the lungs continuous.       rosuvastatin (CRESTOR) 10 MG tablet TAKE 1 TABLET AT BEDTIME 90 tablet 3   Accu-Chek FastClix Lancets MISC USE TO CHECK GLUCOSE AS INDICATED 204 each 3   DROPLET PEN NEEDLES 32G X 4 MM MISC USE AS DIRECTED WITH INSULIN 300 each 3   glucose blood (ACCU-CHEK GUIDE) test strip TEST BLOOD SUGAR TWICE DAILY 200  strip 2          Home: Home Living Family/patient expects to be discharged to:: Private residence Living Arrangements: Spouse/significant other Available Help at Discharge: Family, Available 24 hours/day (wife) Type of Home: Apartment Home Access: Stairs to enter Secretary/administrator of Steps: 15 Entrance Stairs-Rails: Right, Left, Can reach both Home Layout: One level Bathroom Shower/Tub: Associate Professor: Yes Home Equipment: None  Lives With: Spouse   Functional History: Prior Function Prior Level of Function : Independent/Modified Independent   Functional Status:  Mobility: Bed Mobility Overal bed mobility: Needs Assistance Bed Mobility: Sit to Supine, Supine to Sit Rolling: Min assist Sidelying to sit: Max assist Supine to sit: Mod assist Sit to supine: Min assist General bed mobility comments: mod A to bring RLE to and off EOB and to elevate trunk to mindline, min A to return RLE to bed, pt able to self lower trunk and reposition midline and scoot up toward HOB in supine with LUE and LLE Transfers Overall transfer level: Needs assistance Equipment used: 1 person hand held assist Transfers: Sit to/from Stand, Bed to chair/wheelchair/BSC Sit to Stand: Max assist Bed to/from chair/wheelchair/BSC transfer type:: Stand pivot Stand pivot transfers: Max assist General transfer comment: RLE blocked during sit>stands x3, max cues to extend LLE as pt with tendency to keep L knee flexed in standing, pt unable to step RLE and impulsively sitting back down, Ambulation/Gait General Gait Details: nt   ADL: ADL Overall ADL's : Needs assistance/impaired Eating/Feeding: Minimal assistance Eating/Feeding Details (indicate cue type and reason): thickecned liquids; increased time and cuing for precautions; using non-dominant L hand Grooming: Wash/dry face, Supervision/safety, Set up Grooming Details (indicate cue type and reason):  from bed level Upper Body Bathing: Maximal assistance, Sitting Upper Body Bathing Details (indicate cue type and reason): simulated via UB dressing from EOB Lower Body Bathing: Maximal assistance, Sit to/from stand Upper Body Dressing : Maximal assistance, Sitting Upper Body Dressing Details (indicate cue type  and reason): to don new gowns from EOB Lower Body Dressing: Total assistance, Sit to/from stand Toilet Transfer: Maximal assistance, +2 for safety/equipment, Stand-pivot Toilet Transfer Details (indicate cue type and reason): simulated via stand pivot to recliner Toileting- Clothing Manipulation and Hygiene: Total assistance Functional mobility during ADLs: Maximal assistance, +2 for physical assistance, Cueing for safety, Cueing for sequencing General ADL Comments: ADL participation impacted by impaired balance, RUE hemiplegia, and impaired proprioception and motor planning   Cognition: Cognition Overall Cognitive Status: Impaired/Different from baseline Arousal/Alertness: Awake/alert Orientation Level: Oriented X4 Cognition Arousal: Alert Behavior During Therapy: WFL for tasks assessed/performed Overall Cognitive Status: Impaired/Different from baseline Area of Impairment: Safety/judgement, Awareness, Following commands, Attention Current Attention Level: Focused Following Commands: Follows one step commands inconsistently, Follows multi-step commands with increased time Safety/Judgement: Decreased awareness of safety, Decreased awareness of deficits Awareness: Emergent General Comments: unaware of pushing to R   Physical Exam: Blood pressure (!) 142/64, pulse 81, temperature 99.2 F (37.3 C), temperature source Oral, resp. rate 18, height 5\' 11"  (1.803 m), weight 78.9 kg, SpO2 91%. Physical Exam Constitutional:      General: He is not in acute distress. HENT:     Head: Normocephalic and atraumatic.     Right Ear: External ear normal.     Left Ear: External ear normal.      Nose: Nose normal.     Mouth/Throat:     Mouth: Mucous membranes are moist.  Eyes:     Extraocular Movements: Extraocular movements intact.     Conjunctiva/sclera: Conjunctivae normal.     Pupils: Pupils are equal, round, and reactive to light.  Cardiovascular:     Rate and Rhythm: Normal rate and regular rhythm.     Heart sounds: No murmur heard.    No gallop.  Pulmonary:     Effort: Pulmonary effort is normal. No respiratory distress.     Breath sounds: No wheezing.  Abdominal:     General: Bowel sounds are normal. There is no distension.     Palpations: Abdomen is soft.     Tenderness: There is no abdominal tenderness.  Genitourinary:    Penis: Normal.   Musculoskeletal:        General: No swelling or deformity.     Cervical back: Normal range of motion.     Comments: Right arm hypersensitive to touch, PROM  Skin:    General: Skin is warm and dry.  Neurological:     Mental Status: He is alert.     Comments: Pt is alert. Speech very dysarthric. Voice strong. Oriented to person, place, month/year, why he's here. Follows basic commands. STM deficits. CN notable for right central 7. MMT: RUE is 0/5. RLE ?trace HE, 0/5 KE,ADF/PF. RUE with decreased LT and dysesthetic. Sensory function better in RLE without dysesthesias. LUE and LLE sensation normal. Mild flexor tone RUE, extensor tone RLE. DTR's 2+ on right side. No clonus appreciated. Toes up.   Psychiatric:     Comments: Pt generally pleasant and cooperative. A little impulsive.         Lab Results Last 48 Hours        Results for orders placed or performed during the hospital encounter of 09/06/23 (from the past 48 hour(s))  CBC     Status: Abnormal    Collection Time: 09/08/23 10:04 AM  Result Value Ref Range    WBC 7.6 4.0 - 10.5 K/uL    RBC 4.41 4.22 - 5.81 MIL/uL    Hemoglobin 13.2  13.0 - 17.0 g/dL    HCT 14.7 82.9 - 56.2 %    MCV 91.2 80.0 - 100.0 fL    MCH 29.9 26.0 - 34.0 pg    MCHC 32.8 30.0 - 36.0 g/dL     RDW 13.0 (H) 86.5 - 15.5 %    Platelets 163 150 - 400 K/uL    nRBC 0.0 0.0 - 0.2 %      Comment: Performed at [facility], [address], [place], [name] [address]  Basic metabolic panel     Status: Abnormal    Collection Time: 09/08/23 10:04 AM  Result Value Ref Range    Sodium 142 135 - 145 mmol/L    Potassium 4.1 3.5 - 5.1 mmol/L    Chloride 110 98 - 111 mmol/L    CO2 21 (L) 22 - 32 mmol/L    Glucose, Bld 124 (H) 70 - 99 mg/dL      Comment: Glucose reference range applies only to samples taken after fasting for at least 8 hours.    BUN 21 8 - 23 mg/dL    Creatinine, Ser 6.29 (H) 0.61 - 1.24 mg/dL    Calcium 8.8 (L) 8.9 - 10.3 mg/dL    GFR, Estimated 33 (L) >60 mL/min      Comment: (NOTE) Calculated using the CKD-EPI Creatinine Equation (2021)      Anion gap 11 5 - 15      Comment: Performed at [facility], [address] [place], [name] [address]  CBC     Status: Abnormal    Collection Time: 09/09/23  8:55 AM  Result Value Ref Range    WBC 7.5 4.0 - 10.5 K/uL    RBC 4.65 4.22 - 5.81 MIL/uL    Hemoglobin 13.9 13.0 - 17.0 g/dL    HCT 32.4 40.1 - 02.7 %    MCV 91.8 80.0 - 100.0 fL    MCH 29.9 26.0 - 34.0 pg    MCHC 32.6 30.0 - 36.0 g/dL    RDW 25.3 (H) 66.4 - 15.5 %    Platelets 162 150 - 400 K/uL    nRBC 0.0 0.0 - 0.2 %      Comment: Performed at [facility], [address], [place], [name] [address]  Basic metabolic panel     Status: Abnormal    Collection Time: 09/09/23  8:55 AM  Result Value Ref Range    Sodium 137 135 - 145 mmol/L    Potassium 3.9 3.5 - 5.1 mmol/L    Chloride 105 98 - 111 mmol/L    CO2 23 22 - 32 mmol/L    Glucose, Bld 122 (H) 70 - 99 mg/dL      Comment: Glucose reference range applies only to samples taken after fasting for at least 8 hours.    BUN 23 8 - 23 mg/dL    Creatinine, Ser 4.25 (H) 0.61 - 1.24 mg/dL    Calcium 8.6 (L) 8.9 - 10.3 mg/dL    GFR, Estimated 33 (L) >60 mL/min      Comment: (NOTE) Calculated using  the CKD-EPI Creatinine Equation (2021)      Anion gap 9 5 - 15      Comment: Performed at [facility], [address] [place], [name] [address]  CBC     Status: Abnormal    Collection Time: 09/10/23  4:31 AM  Result Value Ref Range    WBC 8.0 4.0 -  10.5 K/uL    RBC 4.22 4.22 - 5.81 MIL/uL    Hemoglobin 12.4 (L) 13.0 - 17.0 g/dL    HCT 41.6 (L) 60.6 - 52.0 %    MCV 91.7 80.0 - 100.0 fL    MCH 29.4 26.0 - 34.0 pg    MCHC 32.0 30.0 - 36.0 g/dL    RDW 30.1 60.1 - 09.3 %    Platelets 153 150 - 400 K/uL    nRBC 0.0 0.0 - 0.2 %      Comment: Performed at [facility], [address], [place], [name] [address]  Basic metabolic panel     Status: Abnormal    Collection Time: 09/10/23  4:31 AM  Result Value Ref Range    Sodium 141 135 - 145 mmol/L    Potassium 4.0 3.5 - 5.1 mmol/L    Chloride 109 98 - 111 mmol/L    CO2 23 22 - 32 mmol/L    Glucose, Bld 124 (H) 70 - 99 mg/dL      Comment: Glucose reference range applies only to samples taken after fasting for at least 8 hours.    BUN 29 (H) 8 - 23 mg/dL    Creatinine, Ser 3.22 (H) 0.61 - 1.24 mg/dL    Calcium 8.7 (L) 8.9 - 10.3 mg/dL    GFR, Estimated 36 (L) >60 mL/min      Comment: (NOTE) Calculated using the CKD-EPI Creatinine Equation (2021)      Anion gap 9 5 - 15      Comment: Performed at [facility], [address], [place], [name] [address]      Imaging Results (Last 48 hours)  No results found.         Blood pressure (!) 142/64, pulse 81, temperature 99.2 F (37.3 C), temperature source Oral, resp. rate 18, height 5\' 11"  (1.803 m), weight 78.9 kg, SpO2 91%.   Medical Problem List and Plan: 1. Functional deficits secondary to left basal ganglia ICH d/t hypertension/eliquis             -patient may shower             -ELOS/Goals: 12-16+ days, goals min assist with PT, OT, SLP             -order resting WHO and PRAFO for right side 2.  Antithrombotics: -DVT/anticoagulation:  Pharmaceutical:  Lovenox added per chat w/Dr. Roda Shutters             -antiplatelet therapy: N/A due to bleed 3. Pain Management: Tylenol prn.  -pt with dysesthesias RUE, consider gabapentin trial--obsv for now 4. Mood/Behavior/Sleep: LCSW to follow for evaluation and support.              -antipsychotic agents: N/A 5. Neuropsych/cognition: This patient is capable of making decisions on his own behalf. 6. Skin/Wound Care: Routine pressure relief measures. 7. Fluids/Electrolytes/Nutrition: Monitor I/O. Check CMET in am             --add low salt restrictions. Add protein supplement for      8. T2DM insulin dependent: Hgb A1c-6.2 on 09/06/23             -diet controlled   9. PAD s/o aortobifemoral bypass w/right pseudoaneurysm: SBP goal < 140.  --Plans for repair early December by Dr. Sherral Hammers? 10. COPD w/chronic bronchitis/emphysema: currently on 6 L oxygen. Titrate to keep sats > 90% per Dr. Vassie Loll.              --  on Trelegy PTA --started on Breo/Incruse today.              --pulmonary toilet. Wean down to baseline as able.  11. CAF: Monitor HR TID. On coreg. Off eliquis due to ICH             --Neurology recommends resuming  Eliquis in 2 weeks if neuro and BP stable.      12. Dilated cardiomyopathy/AS/chronic systolic CHF: Low salt diet. Check weight daily--up from 166-->173?             --CXR 11/11 w/vascular congestion-->will order follow up CXR to check for fluid overload as cause of hypoxia.  --Monitor for signs of overload. On Lasix, coreg, Imdur, hydralazine  -LVEF 55-60% 13. CKD: stage IIIb?             -monitor renal function             -encourage adequate fluids esp given that he's on nectars             -avoid nephrotoxic meds   14. Dysphagia: On D1, nectar   -encourage fluids, aspiration precautions     Jacquelynn Cree, PA-C 09/10/2023  I have personally performed a face to face diagnostic evaluation of this patient and formulated the key components of the plan.  Additionally, I have personally  reviewed laboratory data, imaging studies, as well as relevant notes and concur with the physician assistant's documentation above.  The patient's status has not changed from the original H&P.  Any changes in documentation from the acute care chart have been noted above.  Ranelle Oyster, MD, [place]

## 2023-09-10 NOTE — Plan of Care (Signed)
  Problem: Education: Goal: Knowledge of disease or condition will improve Outcome: Progressing Goal: Knowledge of secondary prevention will improve (MUST DOCUMENT ALL) Outcome: Progressing Goal: Knowledge of patient specific risk factors will improve Loraine Leriche N/A or DELETE if not current risk factor) Outcome: Progressing   Problem: Intracerebral Hemorrhage Tissue Perfusion: Goal: Complications of Intracerebral Hemorrhage will be minimized Outcome: Progressing   Problem: Coping: Goal: Will verbalize positive feelings about self Outcome: Progressing Goal: Will identify appropriate support needs Outcome: Progressing   Problem: Health Behavior/Discharge Planning: Goal: Ability to manage health-related needs will improve Outcome: Progressing Goal: Goals will be collaboratively established with patient/family Outcome: Progressing   Problem: Self-Care: Goal: Ability to participate in self-care as condition permits will improve Outcome: Progressing Goal: Verbalization of feelings and concerns over difficulty with self-care will improve Outcome: Progressing Goal: Ability to communicate needs accurately will improve Outcome: Progressing   Problem: Nutrition: Goal: Risk of aspiration will decrease Outcome: Progressing Goal: Dietary intake will improve Outcome: Progressing   Problem: Education: Goal: Knowledge of General Education information will improve Description: Including pain rating scale, medication(s)/side effects and non-pharmacologic comfort measures Outcome: Progressing   Problem: Health Behavior/Discharge Planning: Goal: Ability to manage health-related needs will improve Outcome: Progressing   Problem: Clinical Measurements: Goal: Ability to maintain clinical measurements within normal limits will improve Outcome: Progressing Goal: Will remain free from infection Outcome: Progressing Goal: Diagnostic test results will improve Outcome: Progressing Goal:  Respiratory complications will improve Outcome: Progressing Goal: Cardiovascular complication will be avoided Outcome: Progressing   Problem: Activity: Goal: Risk for activity intolerance will decrease Outcome: Progressing   Problem: Nutrition: Goal: Adequate nutrition will be maintained Outcome: Progressing   Problem: Coping: Goal: Level of anxiety will decrease Outcome: Progressing   Problem: Elimination: Goal: Will not experience complications related to bowel motility Outcome: Progressing Goal: Will not experience complications related to urinary retention Outcome: Progressing   Problem: Pain Management: Goal: General experience of comfort will improve Outcome: Progressing   Problem: Safety: Goal: Ability to remain free from injury will improve Outcome: Progressing   Problem: Skin Integrity: Goal: Risk for impaired skin integrity will decrease Outcome: Progressing

## 2023-09-10 NOTE — Progress Notes (Addendum)
Inpatient Rehabilitation Admissions Coordinator   I have CIR bed to admit today. I await wife's arrival to make the arrangements to admit today. Acute team and TOC made aware.  Ottie Glazier, RN, MSN Rehab Admissions Coordinator [phone] 09/10/2023 10:45 AM   Met with wife at bedside. She is in agreement to admit to CIR today.  Ottie Glazier, RN, MSN Rehab Admissions Coordinator [phone] 09/10/2023 11:17 AM

## 2023-09-10 NOTE — Hospital Course (Addendum)
Kyle [name] is a 72 y.o.male with a history of dilated cardiomyopathy, COPD, PAD, A-fib who was admitted to the neuro ICU and downgraded to the family medicine teaching Service at [facility] for acute intracranial hemorrhage. His hospital course is detailed below:  Intracranial Hemorrhage 2/2 uncontrolled HTN on Eliquis Patient presented after falling immediately upon getting out of bed.  Blood pressure noted to be elevated to 210 systolically on admission.  Stroke protocol was initiated.  CT head showed acute 7 mm hemorrhage in the left lentiform nuclei.  Neurology was consulted.  Eliquis was stopped, and then reversed with Kcentra.  Admitted to neuro ICU.  Blood pressure was controlled with Cleviprex and hydralazine.  Further workup for stroke with CTA demonstrated ulcerated plaque versus hematoma versus impending aortic dissection which is discussed further below.  Normal risk factor stratification for stroke was optimized.  IV blood pressure medicine was weaned to orals.  Neurostatus steadily improved with PT/OT and SLP eval.  Patient was downgraded to progressive level and care resumed by family medicine teaching service.  Patient remained stable with well controlled blood pressure and was discharged to CIR.   Aortic atherosclerosis, Iliac pseudoaneurysm Head and neck CTA was ordered for further stroke workup.  Demonstrated severe aortic atherosclerosis with multiple plaque ulcerations and suspicion of intramural hematoma and impending aortic dissection.  CTA dissection ordered for further evaluation which showed additional large iliac groin pseudoaneurysm.  CT surgery was consulted who recommended no further surgical intervention but increase blood pressure parameters to less than 140 systolic.  Vascular surgery also consulted with recommendation for outpatient pseudoaneurysm evaluation and repair.  COPD  CHF Baseline oxygen requirement at home 4.5 L nasal cannula.  Required increased oxygen  requirements in the setting of acute stroke to 12 L.  Thought to be mild component of heart failure exacerbation as well.  Diuresed as needed with IV Lasix.  Weaned down to 6 L nasal cannula and was deemed appropriate to discharge to CIR. Restarted inhaler (Breo on formulary in place of home Trelegy) at discharge.  Afib Eliquis reversed with Theodoro Parma in setting of ICH. Neurology recommended to consider restarting anticoagulation in 2 weeks if stable and blood pressure under control   Other chronic conditions were medically managed with home medications and formulary alternatives as necessary (HLD -  Crestor)  PCP Follow-up Recommendations: New blood pressure regimen: Amlodipine 10 mg daily, Carvedilol 6.25 mg twice daily, Clonidine 0.2 mg every 8 hours, Hydralazine 100 mg 3 times daily, Imdur 30 mg daily, goal systolic <140. F/u out pt with neurology in 1 month F/u with vascular for pseudoaneurysm in 1 month Consider restarting anticoagulation in 2 weeks if stable and blood pressure under control  F/u need for diabetes medication (insulin was held at discharge)

## 2023-09-10 NOTE — H&P (Signed)
Physical Medicine and Rehabilitation Admission H&P    Chief Complaint  Patient presents with   Code Stroke    HPI: Kyle [name] is a 73 year old male with history of COPD- oxygen dependent, bilateral lung nodules, CM w/systolic CHF, CKD, PAD, pulmonary HTN, A fib-on Eliquis who was admitted on 09/05/23 after falling on the floor while trying to get out of bed in morning. 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 He was noted to have dysarthric speech with right facial droop and nonfluent speech. MRI brain done revealing unchanged acute left lentiform nucleus hemorrhage with mild edema and mild mass effect on left lateral ventricle. Neurology recommended SBP 130-150 range with repeat CT to monitor for stability. Eliquis reversed with Kcentra. 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 Cliffton Asters evaluated films and felt there no obvious dissection or intramural hematoma noted.   Dr. Robbins/VVS consulted and did not felt that pseudoaneurysm was new. Patient without pain, well perfused right foot with palpable DP pulse and recommended heparin drip when able and surgery within a month if patient able to be anticoagulated. SBP goal <140 per vascular. He was weaned off Cleviprex and Dr. Roda Shutters recommends resuming AC in 2 weeks if neuro and BP stable. He has had high oxygen needs up to 12 liters and was weaned down to  6 L yesterday.       Review of Systems   Constitutional: Negative.   HENT: Negative.    Eyes: Negative.   Respiratory: Negative.    Cardiovascular: Negative.   Gastrointestinal: Negative.   Genitourinary: Negative.   Musculoskeletal: Negative.   Skin:  Positive for itching.  Neurological:  Positive for tingling, sensory change and focal weakness. Dizziness: Right arm sensitive to touch. Psychiatric/Behavioral:  Negative for depression and suicidal ideas.      Past Medical History:  Diagnosis Date   Aortic insufficiency    MODERATE WITH A BICUSPID AORTIC VAVLE   Arterial occlusive disease    MULTILEVEL   CHF (congestive heart failure) (HCC)    Chronic bronchitis (HCC)    CKD (chronic kidney disease) stage 3, GFR 30-59 ml/min (HCC)    COPD (chronic obstructive pulmonary disease) (HCC)    Dilated cardiomyopathy (HCC)    WITH EJECTION FRACTION DOWN TO 20-25%--WITH CONGESTIVE HEART FAILURE   Edema    LOWER EXTREMETIES   Emphysema of lung (HCC)    Hyperglycemia 01/2020   Hypertension    Normal coronary arteries 2009   Orthopnea    Peripheral arterial disease (HCC)    Pulmonary hypertension (HCC)     Past Surgical History:  Procedure Laterality Date   ABDOMINAL AORTAGRAM N/A 11/05/2014   Procedure: ABDOMINAL Ronny Flurry;  Surgeon: Chuck Hint, MD;  Location: [facility] CATH LAB;  Service: Cardiovascular;  Laterality: N/A;   ABDOMINAL AORTOGRAM W/LOWER EXTREMITY Bilateral 04/21/2019   Procedure: ABDOMINAL AORTOGRAM W/LOWER EXTREMITY;  Surgeon: Chuck Hint, MD;  Location: [facility] INVASIVE CV LAB;  Service: Cardiovascular;  Laterality: Bilateral;  ABDOMINAL AORTOGRAM W/LOWER EXTREMITY Right 05/30/2021   Procedure: ABDOMINAL AORTOGRAM W/LOWER EXTREMITY;  Surgeon: Chuck Hint, MD;  Location: [facility] INVASIVE CV LAB;  Service: Cardiovascular;  Laterality: Right;   AORTA - BILATERAL FEMORAL ARTERY BYPASS GRAFT N/A 12/18/2014   Procedure: AORTOBIFEMORAL BYPASS GRAFT;  Surgeon: Chuck Hint, MD;  Location:  [facility] OR;  Service: Vascular;  Laterality: N/A;   CARDIAC CATHETERIZATION  2009   Nl Cors, EF 20%   COLONOSCOPY     ENDARTERECTOMY FEMORAL Left 12/18/2014   Procedure: ENDARTERECTOMY FEMORAL;  Surgeon: Chuck Hint, MD;  Location: [facility] OR;  Service: Vascular;  Laterality: Left;   FALSE ANEURYSM REPAIR Right 04/24/2019   Procedure: REPAIR OF RIGHT FEMORAL ARTERY PSEUDOANEURYSM;  Surgeon: Larina Earthly, MD;  Location: [facility] OR;  Service: Vascular;  Laterality: Right;   FEMORAL-POPLITEAL BYPASS GRAFT  02/21/2011   right   FEMORAL-TIBIAL BYPASS GRAFT Right 04/24/2019   Procedure: REDO BYPASS GRAFT RIGHT FEMORAL-TIBIAL ARTERY USING LEFT LEG VEIN & HEMASHIELD GOLD 8mm GRAFT;  Surgeon: Larina Earthly, MD;  Location: [facility] OR;  Service: Vascular;  Laterality: Right;   IR FLUORO GUIDE CV LINE RIGHT  12/28/2019   IR REMOVAL TUN CV CATH W/O FL  01/11/2020   IR US GUIDE VASC ACCESS RIGHT  12/28/2019   OTHER SURGICAL HISTORY     POST RIGHT FEMOROPOPLITEAL BYPASS GRAFT   PERIPHERAL VASCULAR CATHETERIZATION  11/05/2014   Procedure: LOWER EXTREMITY ANGIOGRAPHY;  Surgeon: Chuck Hint, MD;  Location: [facility] CATH LAB;  Service: Cardiovascular;;   VEIN HARVEST Left 04/24/2019   Procedure: LEFT LEG SAPHENOUS VEIN HARVEST;  Surgeon: Larina Earthly, MD;  Location: [facility] OR;  Service: Vascular;  Laterality: Left;    Family History  Problem Relation Age of Onset   Diabetes Mother    Hyperlipidemia Sister    Hypertension Sister     Social History:  reports that he quit smoking about 4 years ago. His smoking use included cigarettes. He started smoking about 44 years ago. He has a 40 pack-year smoking history. He has never been exposed to tobacco smoke. He has never used smokeless tobacco. He reports that he does not drink alcohol and does not use drugs.   Allergies: No Known Allergies   Medications Prior to Admission  Medication Sig Dispense Refill   albuterol (VENTOLIN HFA) 108 (90 Base) MCG/ACT inhaler Inhale 2  puffs into the lungs every 6 (six) hours as needed for wheezing or shortness of breath. 8 g 2   amLODipine (NORVASC) 5 MG tablet Take 1 tablet (5 mg total) by mouth daily. 90 tablet 0   apixaban (ELIQUIS) 5 MG TABS tablet TAKE 1 TABLET TWICE DAILY 180 tablet 1   carvedilol (COREG) 6.25 MG tablet TAKE 1 TABLET TWICE DAILY WITH MEALS (NEED MD APPOINTMENT) 180 tablet 3   Fluticasone-Umeclidin-Vilant (TRELEGY ELLIPTA) 200-62.5-25 MCG/ACT AEPB Inhale 1 puff into the lungs daily. 180 each 3   furosemide (LASIX) 40 MG tablet TAKE 1/2 TABLET EVERY DAY 45 tablet 3   guaiFENesin (MUCINEX) 600 MG 12 hr tablet Take 2 tablets (1,200 mg total) by mouth 2 (two) times daily. 30 tablet 0   hydrALAZINE (APRESOLINE) 25 MG tablet TAKE 1 TABLET THREE TIMES DAILY (NEED APPOINTMENT WITH PCP FOR REFILLS) (Patient taking differently: Take 25 mg by mouth 3 (three) times daily. (NEED APPOINTMENT WITH PCP FOR REFILLS)) 90 tablet 0   insulin degludec (TRESIBA FLEXTOUCH) 100 UNIT/ML FlexTouch Pen Inject 14 Units into the skin daily. 3  mL 1   isosorbide mononitrate (IMDUR) 30 MG 24 hr tablet TAKE 1 TABLET BY MOUTH ONCE DAILY. 90 tablet 1   levETIRAcetam (KEPPRA) 500 MG tablet Take 1 tablet (500 mg total) by mouth daily. 60 tablet 0   Multiple Vitamins-Minerals (CENTRUM SILVER 50+MEN) TABS Take 1 tablet by mouth daily with breakfast.     NOVOLOG FLEXPEN 100 UNIT/ML FlexPen INJECT 5 UNITS SUBCUTANEOUSLY THREE TIMES DAILY WITH MEALS 3 mL 0   OXYGEN Inhale 4.5 L/min into the lungs continuous.     rosuvastatin (CRESTOR) 10 MG tablet TAKE 1 TABLET AT BEDTIME 90 tablet 3   Accu-Chek FastClix Lancets MISC USE TO CHECK GLUCOSE AS INDICATED 204 each 3   DROPLET PEN NEEDLES 32G X 4 MM MISC USE AS DIRECTED WITH INSULIN 300 each 3   glucose blood (ACCU-CHEK GUIDE) test strip TEST BLOOD SUGAR TWICE DAILY 200 strip 2      Home: Home Living Family/patient expects to be discharged to:: Private residence Living Arrangements:  Spouse/significant other Available Help at Discharge: Family, Available 24 hours/day (wife) Type of Home: Apartment Home Access: Stairs to enter Secretary/administrator of Steps: 15 Entrance Stairs-Rails: Right, Left, Can reach both Home Layout: One level Bathroom Shower/Tub: Associate Professor: Yes Home Equipment: None  Lives With: Spouse   Functional History: Prior Function Prior Level of Function : Independent/Modified Independent  Functional Status:  Mobility: Bed Mobility Overal bed mobility: Needs Assistance Bed Mobility: Sit to Supine, Supine to Sit Rolling: Min assist Sidelying to sit: Max assist Supine to sit: Mod assist Sit to supine: Min assist General bed mobility comments: mod A to bring RLE to and off EOB and to elevate trunk to mindline, min A to return RLE to bed, pt able to self lower trunk and reposition midline and scoot up toward HOB in supine with LUE and LLE Transfers Overall transfer level: Needs assistance Equipment used: 1 person hand held assist Transfers: Sit to/from Stand, Bed to chair/wheelchair/BSC Sit to Stand: Max assist Bed to/from chair/wheelchair/BSC transfer type:: Stand pivot Stand pivot transfers: Max assist General transfer comment: RLE blocked during sit>stands x3, max cues to extend LLE as pt with tendency to keep L knee flexed in standing, pt unable to step RLE and impulsively sitting back down, Ambulation/Gait General Gait Details: nt    ADL: ADL Overall ADL's : Needs assistance/impaired Eating/Feeding: Minimal assistance Eating/Feeding Details (indicate cue type and reason): thickecned liquids; increased time and cuing for precautions; using non-dominant L hand Grooming: Wash/dry face, Supervision/safety, Set up Grooming Details (indicate cue type and reason): from bed level Upper Body Bathing: Maximal assistance, Sitting Upper Body Bathing Details (indicate cue type and reason):  simulated via UB dressing from EOB Lower Body Bathing: Maximal assistance, Sit to/from stand Upper Body Dressing : Maximal assistance, Sitting Upper Body Dressing Details (indicate cue type and reason): to don new gowns from EOB Lower Body Dressing: Total assistance, Sit to/from stand Toilet Transfer: Maximal assistance, +2 for safety/equipment, Stand-pivot Toilet Transfer Details (indicate cue type and reason): simulated via stand pivot to recliner Toileting- Clothing Manipulation and Hygiene: Total assistance Functional mobility during ADLs: Maximal assistance, +2 for physical assistance, Cueing for safety, Cueing for sequencing General ADL Comments: ADL participation impacted by impaired balance, RUE hemiplegia, and impaired proprioception and motor planning  Cognition: Cognition Overall Cognitive Status: Impaired/Different from baseline Arousal/Alertness: Awake/alert Orientation Level: Oriented X4 Cognition Arousal: Alert Behavior During Therapy: WFL for tasks assessed/performed Overall Cognitive Status: Impaired/Different from baseline  Area of Impairment: Safety/judgement, Awareness, Following commands, Attention Current Attention Level: Focused Following Commands: Follows one step commands inconsistently, Follows multi-step commands with increased time Safety/Judgement: Decreased awareness of safety, Decreased awareness of deficits Awareness: Emergent General Comments: unaware of pushing to R  Physical Exam: Blood pressure (!) 142/64, pulse 81, temperature 99.2 F (37.3 C), temperature source Oral, resp. rate 18, height 5\' 11"  (1.803 m), weight 78.9 kg, SpO2 91%. Physical Exam Constitutional:      General: He is not in acute distress. HENT:     Head: Normocephalic and atraumatic.     Right Ear: External ear normal.     Left Ear: External ear normal.     Nose: Nose normal.     Mouth/Throat:     Mouth: Mucous membranes are moist.  Eyes:     Extraocular Movements:  Extraocular movements intact.     Conjunctiva/sclera: Conjunctivae normal.     Pupils: Pupils are equal, round, and reactive to light.  Cardiovascular:     Rate and Rhythm: Normal rate and regular rhythm.     Heart sounds: No murmur heard.    No gallop.  Pulmonary:     Effort: Pulmonary effort is normal. No respiratory distress.     Breath sounds: No wheezing.  Abdominal:     General: Bowel sounds are normal. There is no distension.     Palpations: Abdomen is soft.     Tenderness: There is no abdominal tenderness.  Genitourinary:    Penis: Normal.   Musculoskeletal:        General: No swelling or deformity.     Cervical back: Normal range of motion.     Comments: Right arm hypersensitive to touch, PROM  Skin:    General: Skin is warm and dry.  Neurological:     Mental Status: He is alert.     Comments: Pt is alert. Speech very dysarthric. Voice strong. Oriented to person, place, month/year, why he's here. Follows basic commands. STM deficits. CN notable for right central 7. MMT: RUE is 0/5. RLE ?trace HE, 0/5 KE,ADF/PF. RUE with decreased LT and dysesthetic. Sensory function better in RLE without dysesthesias. LUE and LLE sensation normal. Mild flexor tone RUE, extensor tone RLE. DTR's 2+ on right side. No clonus appreciated. Toes up.   Psychiatric:     Comments: Pt generally pleasant and cooperative. A little impulsive.      Results for orders placed or performed during the hospital encounter of 09/06/23 (from the past 48 hour(s))  CBC     Status: Abnormal   Collection Time: 09/08/23 10:04 AM  Result Value Ref Range   WBC 7.6 4.0 - 10.5 K/uL   RBC 4.41 4.22 - 5.81 MIL/uL   Hemoglobin 13.2 13.0 - 17.0 g/dL   HCT 91.4 78.2 - 95.6 %   MCV 91.2 80.0 - 100.0 fL   MCH 29.9 26.0 - 34.0 pg   MCHC 32.8 30.0 - 36.0 g/dL   RDW 21.3 (H) 08.6 - 57.8 %   Platelets 163 150 - 400 K/uL   nRBC 0.0 0.0 - 0.2 %    Comment: Performed at [facility], [address], [place], [name]  [address]  Basic metabolic panel     Status: Abnormal   Collection Time: 09/08/23 10:04 AM  Result Value Ref Range   Sodium 142 135 - 145 mmol/L   Potassium 4.1 3.5 - 5.1 mmol/L   Chloride 110 98 - 111 mmol/L   CO2 21 (L)  22 - 32 mmol/L   Glucose, Bld 124 (H) 70 - 99 mg/dL    Comment: Glucose reference range applies only to samples taken after fasting for at least 8 hours.   BUN 21 8 - 23 mg/dL   Creatinine, Ser 8.29 (H) 0.61 - 1.24 mg/dL   Calcium 8.8 (L) 8.9 - 10.3 mg/dL   GFR, Estimated 33 (L) >60 mL/min    Comment: (NOTE) Calculated using the CKD-EPI Creatinine Equation (2021)    Anion gap 11 5 - 15    Comment: Performed at [facility], [address] [place], [name] [address]  CBC     Status: Abnormal   Collection Time: 09/09/23  8:55 AM  Result Value Ref Range   WBC 7.5 4.0 - 10.5 K/uL   RBC 4.65 4.22 - 5.81 MIL/uL   Hemoglobin 13.9 13.0 - 17.0 g/dL   HCT 08.6 57.8 - 46.9 %   MCV 91.8 80.0 - 100.0 fL   MCH 29.9 26.0 - 34.0 pg   MCHC 32.6 30.0 - 36.0 g/dL   RDW 62.9 (H) 52.8 - 41.3 %   Platelets 162 150 - 400 K/uL   nRBC 0.0 0.0 - 0.2 %    Comment: Performed at [facility], [address] [place], [name] [address]  Basic metabolic panel     Status: Abnormal   Collection Time: 09/09/23  8:55 AM  Result Value Ref Range   Sodium 137 135 - 145 mmol/L   Potassium 3.9 3.5 - 5.1 mmol/L   Chloride 105 98 - 111 mmol/L   CO2 23 22 - 32 mmol/L   Glucose, Bld 122 (H) 70 - 99 mg/dL    Comment: Glucose reference range applies only to samples taken after fasting for at least 8 hours.   BUN 23 8 - 23 mg/dL   Creatinine, Ser 0.27 (H) 0.61 - 1.24 mg/dL   Calcium 8.6 (L) 8.9 - 10.3 mg/dL   GFR, Estimated 33 (L) >60 mL/min    Comment: (NOTE) Calculated using the CKD-EPI Creatinine Equation (2021)    Anion gap 9 5 - 15    Comment: Performed at [facility], [address] [place], [name] [address]  CBC     Status: Abnormal   Collection Time: 09/10/23  4:31 AM   Result Value Ref Range   WBC 8.0 4.0 - 10.5 K/uL   RBC 4.22 4.22 - 5.81 MIL/uL   Hemoglobin 12.4 (L) 13.0 - 17.0 g/dL   HCT 44.0 (L) 34.7 - 42.5 %   MCV 91.7 80.0 - 100.0 fL   MCH 29.4 26.0 - 34.0 pg   MCHC 32.0 30.0 - 36.0 g/dL   RDW 95.6 38.7 - 56.4 %   Platelets 153 150 - 400 K/uL   nRBC 0.0 0.0 - 0.2 %    Comment: Performed at [facility], [address] [place], [name] [address]  Basic metabolic panel     Status: Abnormal   Collection Time: 09/10/23  4:31 AM  Result Value Ref Range   Sodium 141 135 - 145 mmol/L   Potassium 4.0 3.5 - 5.1 mmol/L   Chloride 109 98 - 111 mmol/L   CO2 23 22 - 32 mmol/L   Glucose, Bld 124 (H) 70 - 99 mg/dL    Comment: Glucose reference range applies only to samples taken after fasting for at least 8 hours.   BUN 29 (H) 8 - 23 mg/dL   Creatinine, Ser  1.96 (H) 0.61 - 1.24 mg/dL   Calcium 8.7 (L) 8.9 - 10.3 mg/dL   GFR, Estimated 36 (L) >60 mL/min    Comment: (NOTE) Calculated using the CKD-EPI Creatinine Equation (2021)    Anion gap 9 5 - 15    Comment: Performed at [facility], [address] [place], [name] [address]   No results found.    Blood pressure (!) 142/64, pulse 81, temperature 99.2 F (37.3 C), temperature source Oral, resp. rate 18, height 5\' 11"  (1.803 m), weight 78.9 kg, SpO2 91%.  Medical Problem List and Plan: 1. Functional deficits secondary to left basal ganglia ICH d/t hypertension/eliquis  -patient may shower  -ELOS/Goals: 12-16+ days, goals min assist with PT, OT, SLP  -order resting WHO and PRAFO for right side 2.  Antithrombotics: -DVT/anticoagulation:  Pharmaceutical: Lovenox added per chat w/Dr. Roda Shutters  -antiplatelet therapy: N/A due to bleed 3. Pain Management: Tylenol prn. 4. Mood/Behavior/Sleep: LCSW to follow for evaluation and support.   -antipsychotic agents: N/A 5. Neuropsych/cognition: This patient is capable of making decisions on his own behalf. 6. Skin/Wound Care: Routine pressure  relief measures. 7. Fluids/Electrolytes/Nutrition: Monitor I/O. Check CMET in am  --add low salt restrictions. Add protein supplement for    8. T2DM insulin dependent: Hgb A1c-6.2 on 09/06/23  -diet controlled  9. PAD s/o aortobifemoral bypass w/right pseudoaneurysm: SBP goal < 140.  --Plans for repair early December by Dr. Sherral Hammers? 10. COPD w/chronic bronchitis/emphysema: currently on 6 L oxygen. Titrate to keep sats > 90% per Dr. Vassie Loll.   --on Trelegy PTA --started on Breo/Incruse today.   --pulmonary toilet. Wean down to baseline as able.  11. CAF: Monitor HR TID. On coreg. Off eliquis due to ICH  --Neurology recommends resuming  Eliquis in 2 weeks if neuro and BP stable.    12. Dilated cardiomyopathy/AS/chronic systolic CHF: Low salt diet. Check weight daily--up from 166-->173?  --CXR 11/11 w/vascular congestion-->will order follow up CXR to check for fluid overload as cause of hypoxia.  --Monitor for signs of overload. On Lasix, coreg, Imdur, hydralazine  -LVEF 55-60% 13. CKD: stage IIIb?  -monitor renal function  -encourage adequate fluids esp given that he's on nectars  -avoid nephrotoxic meds  14. Dysphagia: On D1, nectar      Jacquelynn Cree, PA-C 09/10/2023

## 2023-09-10 NOTE — Discharge Summary (Addendum)
Family Medicine Teaching [facility] Discharge Summary  Patient name: Kyle [name] Medical record number: [id number] Date of birth: 06/02/1951 Age: 72 y.o. Gender: male Date of Admission: 09/06/2023  Date of Discharge: 09/10/23 Admitting Physician: Erick Blinks, MD  Primary Care Provider: Levin Erp, MD Consultants: neurology  Indication for Hospitalization: stroke  Discharge Diagnoses/Problem List:  Principal Problem for Admission: stroke Other Problems addressed during stay:  Principal Problem:   ICH (intracerebral hemorrhage) (HCC) Active Problems:   COPD (chronic obstructive pulmonary disease) (HCC)   Pseudoaneurysm [facility])    Brief Hospital Course:  Kyle [name] is a 72 y.o.male with a history of dilated cardiomyopathy, COPD, PAD, A-fib who was admitted to the neuro ICU and downgraded to the family medicine teaching Service at [facility] for acute intracranial hemorrhage. His hospital course is detailed below:  Intracranial Hemorrhage 2/2 uncontrolled HTN on Eliquis Patient presented after falling immediately upon getting out of bed.  Blood pressure noted to be elevated to 210 systolically on admission.  Stroke protocol was initiated.  CT head showed acute 7 mm hemorrhage in the left lentiform nuclei.  Neurology was consulted.  Eliquis was stopped, and then reversed with Kcentra.  Admitted to neuro ICU.  Blood pressure was controlled with Cleviprex and hydralazine.  Further workup for stroke with CTA demonstrated ulcerated plaque versus hematoma versus impending aortic dissection which is discussed further below.  Normal risk factor stratification for stroke was optimized.  IV blood pressure medicine was weaned to orals.  Neurostatus steadily improved with PT/OT and SLP eval.  Patient was downgraded to progressive level and care resumed by family medicine teaching service.  Patient remained stable with well controlled blood pressure and was discharged to  CIR.   Aortic atherosclerosis, Iliac pseudoaneurysm Head and neck CTA was ordered for further stroke workup.  Demonstrated severe aortic atherosclerosis with multiple plaque ulcerations and suspicion of intramural hematoma and impending aortic dissection.  CTA dissection ordered for further evaluation which showed additional large iliac groin pseudoaneurysm.  CT surgery was consulted who recommended no further surgical intervention but increase blood pressure parameters to less than 140 systolic.  Vascular surgery also consulted with recommendation for outpatient pseudoaneurysm evaluation and repair.  COPD  CHF Baseline oxygen requirement at home 4.5 L nasal cannula.  Required increased oxygen requirements in the setting of acute stroke to 12 L.  Thought to be mild component of heart failure exacerbation as well.  Diuresed as needed with IV Lasix.  Weaned down to 6 L nasal cannula and was deemed appropriate to discharge to CIR. Restarted inhaler (Breo on formulary in place of home Trelegy) at discharge.  Afib Eliquis reversed with Theodoro Parma in setting of ICH. Neurology recommended to consider restarting anticoagulation in 2 weeks if stable and blood pressure under control   Other chronic conditions were medically managed with home medications and formulary alternatives as necessary (HLD -  Crestor)  PCP Follow-up Recommendations: New blood pressure regimen: Amlodipine 10 mg daily, Carvedilol 6.25 mg twice daily, Clonidine 0.2 mg every 8 hours, Hydralazine 100 mg 3 times daily, Imdur 30 mg daily, goal systolic <140. F/u out pt with neurology in 1 month F/u with vascular for pseudoaneurysm in 1 month Consider restarting anticoagulation in 2 weeks if stable and blood pressure under control  F/u need for diabetes medication (insulin was held at discharge)  Disposition: CIR  Discharge Condition: stable  Discharge Exam:  Vitals:   09/10/23 0340 09/10/23 0802  BP: (!) 142/64   Pulse: 82  81   Resp: 18 18  Temp: 99.2 F (37.3 C)   SpO2: 93% 91%   General: sitting up in bed, awake and alert, in NAD Cardiovascular: RRR, normal S1/S2 Respiratory: CTAB, normal WOB on 6L [place] Abdomen: normoactive bowel sounds, soft, nontender, nondistended Extremities: SCDs in place Neuro: dysarthric speech, deferred full exam as PT at bedside to work with patient  Significant Procedures: none  Significant Labs and Imaging:  Recent Labs  Lab 09/09/23 0855 09/10/23 0431  WBC 7.5 8.0  HGB 13.9 12.4*  HCT 42.7 38.7*  PLT 162 153   Recent Labs  Lab 09/09/23 0855 09/10/23 0431  NA 137 141  K 3.9 4.0  CL 105 109  CO2 23 23  GLUCOSE 122* 124*  BUN 23 29*  CREATININE 2.12* 1.96*  CALCIUM 8.6* 8.7*   Echo 11/11: EF 55-60%, moderate concentric LVH, G1DD, severe AV calcification  CT head 11/11:  1. Positive for Acute 7 mL hemorrhage in the left lentiform nuclei. Mild surrounding edema but no significant mass effect. No IVH, extra-axial blood, or other complicating features.   2. Progressed since 2021 and age indeterminate small-vessel ischemia in the contralateral right caudate nucleus. No superimposed acute cortically based infarct identified.  CT angio head/neck 11/11 1. Negative for large vessel occlusion. No CTA spot sign or vascular malformation at the left lentiform hemorrhage.   2. But positive for highly Abnormal Aortic Arch: Bulky atherosclerotic soft plaque with multiple ulcerations, appearance suspicious for developing intramural hematoma, impending dissection (series 8, image 93). Aortic Atherosclerosis (ICD10-I70.0).   3. 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 - Severe Left ICA supraclinoid stenosis. - Moderate supraclinoid Right ICA stenosis.   4. Emphysema (ICD10-J43.9).  CT angio chest/abd/pelvis 11/11: 1. Extensive aortic  atherosclerosis with multiple penetrating ulcers and ulcerated plaques. 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 2. Patient is status post aorto bi-iliac bypass graft. Both limbs of the graft are widely patent. 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 Consultation with vascular surgery is strongly recommended for further clinical evaluation and management. 3. 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 4. Aortic atherosclerosis, in addition to left main and 2 vessel coronary artery disease. 5. There are calcifications of the aortic valve. Echocardiographic, as above. Correlation for evaluation of potential valvular dysfunction may be warranted if clinically indicated. 6. Cardiomegaly with concentric left ventricular hypertrophy. 7. Colonic diverticulosis without evidence of acute diverticulitis at this time. 8. 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 9. Additional incidental findings  Results/Tests Pending at Time of Discharge: none  Discharge Medications:  Allergies as of 09/10/2023   No Known Allergies      Medication List     STOP taking these medications    Accu-Chek FastClix Lancets Misc   Accu-Chek Guide test strip Generic drug: glucose blood   albuterol 108 (90 Base) MCG/ACT inhaler Commonly known as: VENTOLIN HFA Replaced by: albuterol (2.5 MG/3ML) 0.083% nebulizer solution   Droplet Pen Needles 32G X 4 MM Misc Generic drug: Insulin Pen Needle   Eliquis 5 MG Tabs tablet Generic drug: apixaban   guaiFENesin 600 MG 12 hr tablet Commonly known as: MUCINEX   NovoLOG FlexPen 100 UNIT/ML FlexPen  Generic drug: insulin aspart   OXYGEN   Trelegy Ellipta 200-62.5-25 MCG/ACT  Aepb Generic drug: Fluticasone-Umeclidin-Vilant   Evaristo Bury FlexTouch 100 UNIT/ML FlexTouch Pen Generic drug: insulin degludec       TAKE these medications    acetaminophen 325 MG tablet Commonly known as: TYLENOL Take 2 tablets (650 mg total) by mouth every 4 (four) hours as needed for mild pain (pain score 1-3) (or temp > 37.5 C (99.5 F)).   albuterol (2.5 MG/3ML) 0.083% nebulizer solution Commonly known as: PROVENTIL Inhale 3 mLs into the lungs every 6 (six) hours as needed for wheezing or shortness of breath. Replaces: albuterol 108 (90 Base) MCG/ACT inhaler   amLODipine 10 MG tablet Commonly known as: NORVASC Take 1 tablet (10 mg total) by mouth daily. Start taking on: September 11, 2023 What changed:  medication strength how much to take   carvedilol 6.25 MG tablet Commonly known as: COREG TAKE 1 TABLET TWICE DAILY WITH MEALS (NEED MD APPOINTMENT)   Centrum Silver 50+Men Tabs Take 1 tablet by mouth daily with breakfast.   cloNIDine 0.2 MG tablet Commonly known as: CATAPRES Take 1 tablet (0.2 mg total) by mouth every 8 (eight) hours.   fluticasone furoate-vilanterol 200-25 MCG/ACT Aepb Commonly known as: BREO ELLIPTA Inhale 1 puff into the lungs daily.   furosemide 40 MG tablet Commonly known as: LASIX TAKE 1/2 TABLET EVERY DAY   hydrALAZINE 100 MG tablet Commonly known as: APRESOLINE Take 1 tablet (100 mg total) by mouth 3 (three) times daily. What changed:  medication strength See the new instructions.   isosorbide mononitrate 30 MG 24 hr tablet Commonly known as: IMDUR TAKE 1 TABLET BY MOUTH ONCE DAILY.   levETIRAcetam 500 MG tablet Commonly known as: KEPPRA Take 1 tablet (500 mg total) by mouth 2 (two) times daily. What changed: when to take this   rosuvastatin 10 MG tablet Commonly known as: CRESTOR TAKE 1 TABLET AT BEDTIME   umeclidinium bromide 62.5 MCG/ACT Aepb Commonly known as: INCRUSE ELLIPTA Inhale 1 puff into the lungs daily.         Discharge Instructions: Please refer to Patient Instructions section of EMR for full details.  Patient was counseled important signs and symptoms that should prompt return to medical care, changes in medications, dietary instructions, activity restrictions, and follow up appointments.   Follow-Up Appointments:  Follow-up Information     [facility] Follow up in 4 week(s).   Contact information: [address]     [address] [place] [address] [phone]                Ivery Quale, MD 09/10/2023, 2:47 PM PGY-1, [facility] Family Medicine  I have verified that the resident's  findings are accurately documented in the resident's note. I have made edits and changes where appropriate, and agree with plan.  Celine Mans, MD, PGY-2 [facility] 2:47 PM 09/10/2023

## 2023-09-10 NOTE — Progress Notes (Addendum)
STROKE TEAM PROGRESS NOTE   BRIEF HPI [name] is a 72 y.o. male with history of dilated cardiomyopathy, chronic respiratory failure on 4.5L baseline, hx of fem pop bypass in 2010, an aortobifemoral bypass in 2016 presenting with right sided weakness. CT showed a left thalamic hemorrhage. He was given Kcentra for eliquis reversal in the ED. CT surgery consulted and recommend BP control less than 140 for aortic management. Also found to have a large right pseudoaneurysm and [facility] has been consulted. Plan for intervention after discharge.  SIGNIFICANT HOSPITAL EVENTS 11/10- admit to ICU due to ICH 11/11- VVS consult for pseudoaneurysm  11/13- transferred out of ICU 11/15- discharge to CIR   INTERIM HISTORY/SUBJECTIVE He has been hemodynamically stable, and his neurological exam remains stable. Follow up outpatient with VVS and neurology in approx 1 month, plan to transfer to CIR today  OBJECTIVE  CBC    Component Value Date/Time   WBC 8.0 09/10/2023 0431   RBC 4.22 09/10/2023 0431   HGB 12.4 (L) 09/10/2023 0431   HGB 14.8 02/12/2022 0943   HCT 38.7 (L) 09/10/2023 0431   HCT 45.0 02/12/2022 0943   PLT 153 09/10/2023 0431   PLT 174 02/12/2022 0943   MCV 91.7 09/10/2023 0431   MCV 95 02/12/2022 0943   MCH 29.4 09/10/2023 0431   MCHC 32.0 09/10/2023 0431   RDW 15.4 09/10/2023 0431   RDW 15.0 02/12/2022 0943   LYMPHSABS 1.8 09/06/2023 0514   LYMPHSABS 1.5 02/12/2022 0943   MONOABS 0.9 09/06/2023 0514   EOSABS 0.1 09/06/2023 0514   EOSABS 0.1 02/12/2022 0943   BASOSABS 0.0 09/06/2023 0514   BASOSABS 0.0 02/12/2022 0943    BMET    Component Value Date/Time   NA 141 09/10/2023 0431   NA 145 (H) 02/03/2023 0927   K 4.0 09/10/2023 0431   CL 109 09/10/2023 0431   CO2 23 09/10/2023 0431   GLUCOSE 124 (H) 09/10/2023 0431   BUN 29 (H) 09/10/2023 0431   BUN 22 02/03/2023 0927   CREATININE 1.96 (H) 09/10/2023 0431   CREATININE 0.99 11/12/2014 1344   CALCIUM 8.7 (L)  09/10/2023 0431   EGFR 43 (L) 02/03/2023 0927   GFRNONAA 36 (L) 09/10/2023 0431    IMAGING past 24 hours No results found.  Vitals:   09/09/23 1955 09/09/23 2344 09/10/23 0340 09/10/23 0802  BP: (!) 125/58 119/60 (!) 142/64   Pulse: 69 74 82 81  Resp: 18 18 18 18   Temp: 98.4 F (36.9 C) 98.8 F (37.1 C) 99.2 F (37.3 C)   TempSrc: Oral Oral Oral   SpO2: (!) 89% 96% 93% 91%  Weight:      Height:         PHYSICAL EXAM General:  Alert, well-nourished, well-developed patient in no acute distress Psych:  Mood and affect appropriate for situation CV: Regular rate and rhythm on monitor Respiratory:  Regular, coarse, unlabored respirations 10L, [place]  NEURO:  Mental Status: AA&Ox3, patient is able to give clear and coherent history Speech/Language: speech is dysarthric  Cranial Nerves:  II: PERRL. Visual fields full.  III, IV, VI: EOMI. Eyelids elevate symmetrically.  V: Sensation is intact to light touch and symmetrical to face.  VII: Right facial droop VIII: hearing intact to voice. IX, X: Voice is dysarthric WJ:[id number] shrug 5/5. XII: tongue is midline without fasciculations. Motor:  RUE- 0/5 RLE - 0/5 LUE and LLE with 5/5 movemet No movement distal with toes Tone: is normal and bulk  is normal Sensation- Intact to light touch bilaterally.    Coordination: FTN intact on the left.No drift.  Gait- deferred  NIHSS 11  ASSESSMENT/PLAN  ICH:  left BG ICH s/p kcentra, etiology: Likely uncontrolled HTN in the setting of Eliquis use Code Stroke CT head  Positive for Acute 7 mL hemorrhage in the left lentiform nuclei.   CTA head & neck No CTA spot sign or vascular malformation at the left lentiform hemorrhage MRI  Unchanged acute hemorrhage centered within the left lentiform nuclei with mild surrounding edema and mild mass effect on the left lateral ventricle. 2D Echo EF 55 to 60% LDL 61 HgbA1c 6.2 UDS negative VTE prophylaxis - SCDs Eliquis (apixaban) daily prior to  admission, now on no anticoagulation due to ICH - resume 11/25 if neurologically stable or follow up with Dr. Pearlean Brownie at [facility] to consider ASPIRE trial.  Therapy recommendations:  CIR Disposition:  Discharge to CIR   Large right groin pseudoaneurysm  CTA Chest/Abd/Pel- status post aorto bi-iliac bypass graft. Both limbs of the graft are widely patent. 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  VVS consulted, likely chronic, plan to repair in 1 month as outpatient. No pain in right groin, pulses palpable  BP goal less than 140  Aortic atherosclerosis CTA neck  - bulky atherosclerotic soft plaque with multiple ulcerations, appearance suspicious for developing intramural hematoma, impending dissection  CTA Chest/Abd/Pel- Extensive aortic atherosclerosis with multiple penetrating ulcers and ulcerated plaques. 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 CTS consulted, no indication for intervention BP goal less than 140  Atrial fibrillation Home Meds: eliquis  Reversed with ? Kcentra Hold off AC at this moment Consider to resume AC on 11/25 or follow up with Dr. Pearlean Brownie at [facility] to consider ASPIRE trial  COPD  Chronic respiratory failure  on 4.5L [place] at home Now on 10L->6L, wean down O2 if O2 sat > 90 Follows with pulmonology outpatient  CXR vascular congestion On lasix 20mg  daily  S/p IV 60mg  Lasix x 2  Hypertension Congestive heart failure  Dilated cardiomyopathy Home meds:  Norvasc, coreg, imdur, hydralazine, lasix BP stable now PRN labetalol and hydralazine Now on amlodipine 10, coreg 6.25 bid, imdur 30, hydralazine 25->100 tid, lasix 20, clonidine 0.1->0.2 mg 3 times daily Blood Pressure Goal: SBP less than 140  Hyperlipidemia Home meds: crestor 10mg  LDL  61 Resume home Crestor 10 No high intensity statin given LDL at goal Continue statin on discharge  Dysphagia Patient has post-stroke dysphagia, SLP consulted On full liquid diet with nectar thick Advance diet as tolerated  Other Active Problems Hx of seizure- continue keppra 500mg  BID  Hospital day # 4   Patient seen and examined by NP/APP with MD. MD to update note as needed.   Elmer Picker, DNP, FNP-BC [facility] Pager: [phone]  ATTENDING NOTE: I reviewed above note and agree with the assessment and plan. Pt was seen and examined.   No acute event overnight, patient neuro stable, unchanged.  Respiratory status also stable, on oxygen less than 6 L, maintaining O2 sats.  BP stable on multiple p.o. BP meds.  PT and OT recommend CIR, likely CIR admission today.  Continue BP meds and Keppra as well as low-dose statin.  May consider resume DOAC in 2 weeks or follow-up with Dr. Pearlean Brownie at [facility] to  consider clinical trial.  For detailed assessment and plan, please refer to above/below as I have made changes wherever appropriate.   Neurology will sign off. Please call with questions. Pt will follow up with stroke clinic Dr. Pearlean Brownie at [facility] in about 3-4 weeks. Thanks for the consult.   Marvel Plan, MD PhD Stroke Neurology 09/10/2023 11:01 PM     To contact Stroke Continuity provider, please refer to [email or web address]. After hours, contact General Neurology

## 2023-09-10 NOTE — Progress Notes (Signed)
Physical Therapy Treatment Patient Details Name: Kyle [name] MRN: [id number] DOB: Jul 01, 1951 Today's Date: 09/10/2023   History of Present Illness 72 yo male presents to [facility] on 11/11 with R sided weakness, SBP 200s. CTH shows L thalamic ICH. Pt also with large R groin pseudoaneurysm, vascular surgery following at a distance. PMH includes dilated cardiomyopathy, HF, COPD on 4.5L baseline, HTN, PAD, hx of fem pop bypass 2010, aortobifemoral bypass 2016.    PT Comments  Pt greeted resting in bed on arrival and agreeable to session. Pt requiring mod A to come sitting up EOB to manage RLE and elevate trunk to sitting with pt demonstrating poor sequencing and problem solving needing cues to push up with LLE to assist with trunk elevation. Pt continues to demonstrate some pushing to R with LUE, reaching activities performed seated up EOB to facilitate midline sitting with noted improvement. Pt continues to require max A to come to standing x3 throughout session with RLE block and max cues to extend LLE as pt with tendency to keep L knee flexed in stance. SpO2 88/95% on 6L HFNC throughout activity, see general comments for specifics. Current plan remains appropriate to address deficits and maximize functional independence and decrease caregiver burden. Pt continues to benefit from skilled PT services to progress toward functional mobility goals.      If plan is discharge home, recommend the following: Two people to help with walking and/or transfers;A lot of help with bathing/dressing/bathroom   Can travel by private vehicle        Equipment Recommendations  Other (comment) (TBA)    Recommendations for Other Services       Precautions / Restrictions Precautions Precautions: Fall Precaution Comments: R hemiplegia Restrictions Weight Bearing Restrictions: No     Mobility  Bed Mobility Overal bed mobility: Needs Assistance Bed Mobility: Sit to Supine, Supine to Sit     Supine to sit: Mod  assist Sit to supine: Min assist   General bed mobility comments: mod A to bring RLE to and off EOB and to elevate trunk to mindline, min A to return RLE to bed, pt able to self lower trunk and reposition midline and scoot up toward HOB in supine with LUE and LLE    Transfers Overall transfer level: Needs assistance Equipment used: 1 person hand held assist Transfers: Sit to/from Stand, Bed to chair/wheelchair/BSC Sit to Stand: Max assist           General transfer comment: RLE blocked during sit>stands x3, max cues to extend LLE as pt with tendency to keep L knee flexed in standing, pt unable to step RLE and impulsively sitting back down,    Ambulation/Gait               General Gait Details: nt   Stairs             Wheelchair Mobility     Tilt Bed    Modified Rankin (Stroke Patients Only) Modified Rankin (Stroke Patients Only) Pre-Morbid Rankin Score: No symptoms Modified Rankin: Severe disability     Balance Overall balance assessment: Needs assistance Sitting-balance support: Feet supported, Single extremity supported Sitting balance-Leahy Scale: Poor Sitting balance - Comments: moments of CGA from EOB with unilateral support from LUE but noted to require up to MODA d/t pushing to R side and pushing posterior Postural control: Right lateral lean, Posterior lean Standing balance support: Bilateral upper extremity supported Standing balance-Leahy Scale: Poor Standing balance comment: pt required BUE support in standing from  therapists with RLE blocked and assist at trunk to keep trunk elevated and upright                            Cognition Arousal: Alert Behavior During Therapy: WFL for tasks assessed/performed Overall Cognitive Status: Impaired/Different from baseline Area of Impairment: Safety/judgement, Awareness, Following commands, Attention                   Current Attention Level: Focused   Following Commands: Follows  one step commands inconsistently, Follows multi-step commands with increased time Safety/Judgement: Decreased awareness of safety, Decreased awareness of deficits Awareness: Emergent   General Comments: unaware of pushing to R        Exercises Other Exercises Other Exercises: reaching outside BOS anterior with LUE to facilitate upright sitting, reaching L with LUE to facilitate midline sitting    General Comments General comments (skin integrity, edema, etc.): SpO2 out of nares on arrival in room with SpO2 83% on RA, reapplied 6L HFNC with SpO2 increasingt to 89% with HOB 30degrees and cues for breathing techniques, increasing to 95% once seated up EOB, variable 88-95% throughout activity, moments of poor pleth reading as pt gripping bedrail with L hand,      Pertinent Vitals/Pain Pain Assessment Pain Assessment: No/denies pain Pain Intervention(s): Monitored during session    Home Living                          Prior Function            PT Goals (current goals can now be found in the care plan section) Acute Rehab PT Goals PT Goal Formulation: With patient Time For Goal Achievement: 09/21/23 Progress towards PT goals: Progressing toward goals    Frequency    Min 1X/week      PT Plan      Co-evaluation              AM-PAC PT "6 Clicks" Mobility   Outcome Measure  Help needed turning from your back to your side while in a flat bed without using bedrails?: A Lot Help needed moving from lying on your back to sitting on the side of a flat bed without using bedrails?: Total Help needed moving to and from a bed to a chair (including a wheelchair)?: Total Help needed standing up from a chair using your arms (e.g., wheelchair or bedside chair)?: Total Help needed to walk in hospital room?: Total Help needed climbing 3-5 steps with a railing? : Total 6 Click Score: 7    End of Session Equipment Utilized During Treatment: Gait belt;Oxygen Activity  Tolerance: Patient limited by fatigue Patient left: with call bell/phone within reach;in bed;with nursing/sitter in room;Other (comment) (with bed in chair position) Nurse Communication: Mobility status PT Visit Diagnosis: Other abnormalities of gait and mobility (R26.89);Muscle weakness (generalized) (M62.81)     Time: 1610-9604 PT Time Calculation (min) (ACUTE ONLY): 28 min  Charges:    $Therapeutic Activity: 8-22 mins $Neuromuscular Re-education: 8-22 mins PT General Charges $$ ACUTE PT VISIT: 1 Visit                     Swayze Pries R. PTA Acute Rehabilitation Services Office: [phone]   Catalina Antigua 09/10/2023, 9:54 AM

## 2023-09-10 NOTE — Progress Notes (Signed)
Horton Chin, MD  Physician Physical Medicine and Rehabilitation   Consult Note    Signed   Date of Service: 09/08/2023 10:17 AM  Related encounter: ED to Hosp-Admission (Current) from 09/06/2023 in [facility] [facility]   Signed     Expand All Collapse All  Show:Clear all [x] Written[x] Templated[] Copied  Added by: [x] Raulkar, Drema Pry, MD  [] Hover for details          Physical Medicine and Rehabilitation Consult Reason for Consult: ICH Referring Physician: Stroke MD     HPI: Kyle [name] is a 72 y.o. male who presents to [facility] on 11/11 with right sided weakness and SBP ins 200s. CTH shows L thalamic ICH. 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 PMH includes dilated cardiomyopathy, HF, COPD on 4.5L at baseline, HTN, PAD, history of fem pop bypass in 2010, aortobifemoral bypass in 2016. His current deficits include R hemiplegia, impaired balance with R lateral bias and LUE pushing, impaired postural awareness, Max difficulty mobilizing, and decreased activity tolerance. Physical Medicine & Rehabilitation was consulted to assess candidacy for CIR.       ROS denies pain     Past Medical History:  Diagnosis Date   Aortic insufficiency      MODERATE WITH A BICUSPID AORTIC VAVLE   Arterial occlusive disease      MULTILEVEL   CHF (congestive heart failure) (HCC)     Chronic bronchitis (HCC)     CKD (chronic kidney disease) stage 3, GFR 30-59 ml/min (HCC)     COPD (chronic obstructive pulmonary disease) (HCC)     Dilated cardiomyopathy (HCC)      WITH EJECTION FRACTION DOWN TO 20-25%--WITH CONGESTIVE HEART FAILURE   Edema      LOWER EXTREMETIES   Emphysema of lung (HCC)     Hyperglycemia 01/2020   Hypertension     Normal coronary arteries 2009   Orthopnea     Peripheral arterial disease (HCC)     Pulmonary hypertension (HCC)               Past Surgical History:  Procedure  Laterality Date   ABDOMINAL AORTAGRAM N/A 11/05/2014    Procedure: ABDOMINAL Ronny Flurry;  Surgeon: Chuck Hint, MD;  Location: [facility] CATH LAB;  Service: Cardiovascular;  Laterality: N/A;   ABDOMINAL AORTOGRAM W/LOWER EXTREMITY Bilateral 04/21/2019    Procedure: ABDOMINAL AORTOGRAM W/LOWER EXTREMITY;  Surgeon: Chuck Hint, MD;  Location: [facility] INVASIVE CV LAB;  Service: Cardiovascular;  Laterality: Bilateral;   ABDOMINAL AORTOGRAM W/LOWER EXTREMITY Right 05/30/2021    Procedure: ABDOMINAL AORTOGRAM W/LOWER EXTREMITY;  Surgeon: Chuck Hint, MD;  Location: [facility] INVASIVE CV LAB;  Service: Cardiovascular;  Laterality: Right;   AORTA - BILATERAL FEMORAL ARTERY BYPASS GRAFT N/A 12/18/2014    Procedure: AORTOBIFEMORAL BYPASS GRAFT;  Surgeon: Chuck Hint, MD;  Location: [facility] OR;  Service: Vascular;  Laterality: N/A;   CARDIAC CATHETERIZATION   2009    Nl Cors, EF 20%   COLONOSCOPY       ENDARTERECTOMY FEMORAL Left 12/18/2014    Procedure: ENDARTERECTOMY FEMORAL;  Surgeon: Chuck Hint, MD;  Location: [facility] OR;  Service: Vascular;  Laterality: Left;   FALSE ANEURYSM REPAIR Right 04/24/2019    Procedure: REPAIR OF RIGHT FEMORAL ARTERY PSEUDOANEURYSM;  Surgeon: Larina Earthly, MD;  Location: [facility] OR;  Service: Vascular;  Laterality: Right;   FEMORAL-POPLITEAL BYPASS GRAFT   02/21/2011  right   FEMORAL-TIBIAL BYPASS GRAFT Right 04/24/2019    Procedure: REDO BYPASS GRAFT RIGHT FEMORAL-TIBIAL ARTERY USING LEFT LEG VEIN & HEMASHIELD GOLD 8mm GRAFT;  Surgeon: Larina Earthly, MD;  Location: [facility] OR;  Service: Vascular;  Laterality: Right;   IR FLUORO GUIDE CV LINE RIGHT   12/28/2019   IR REMOVAL TUN CV CATH W/O FL   01/11/2020   IR US GUIDE VASC ACCESS RIGHT   12/28/2019   OTHER SURGICAL HISTORY        POST RIGHT FEMOROPOPLITEAL BYPASS GRAFT   PERIPHERAL VASCULAR CATHETERIZATION   11/05/2014    Procedure: LOWER EXTREMITY ANGIOGRAPHY;  Surgeon: Chuck Hint, MD;  Location: [facility] CATH  LAB;  Service: Cardiovascular;;   VEIN HARVEST Left 04/24/2019    Procedure: LEFT LEG SAPHENOUS VEIN HARVEST;  Surgeon: Larina Earthly, MD;  Location: [facility] OR;  Service: Vascular;  Laterality: Left;             Family History  Problem Relation Age of Onset   Diabetes Mother     Hyperlipidemia Sister     Hypertension Sister          Social History:  reports that he quit smoking about 4 years ago. His smoking use included cigarettes. He started smoking about 44 years ago. He has a 40 pack-year smoking history. He has never been exposed to tobacco smoke. He has never used smokeless tobacco. He reports that he does not drink alcohol and does not use drugs. Allergies:  Allergies  No Known Allergies         Medications Prior to Admission  Medication Sig Dispense Refill   albuterol (VENTOLIN HFA) 108 (90 Base) MCG/ACT inhaler Inhale 2 puffs into the lungs every 6 (six) hours as needed for wheezing or shortness of breath. 8 g 2   amLODipine (NORVASC) 5 MG tablet Take 1 tablet (5 mg total) by mouth daily. 90 tablet 0   apixaban (ELIQUIS) 5 MG TABS tablet TAKE 1 TABLET TWICE DAILY 180 tablet 1   carvedilol (COREG) 6.25 MG tablet TAKE 1 TABLET TWICE DAILY WITH MEALS (NEED MD APPOINTMENT) 180 tablet 3   Fluticasone-Umeclidin-Vilant (TRELEGY ELLIPTA) 200-62.5-25 MCG/ACT AEPB Inhale 1 puff into the lungs daily. 180 each 3   furosemide (LASIX) 40 MG tablet TAKE 1/2 TABLET EVERY DAY 45 tablet 3   guaiFENesin (MUCINEX) 600 MG 12 hr tablet Take 2 tablets (1,200 mg total) by mouth 2 (two) times daily. 30 tablet 0   hydrALAZINE (APRESOLINE) 25 MG tablet TAKE 1 TABLET THREE TIMES DAILY (NEED APPOINTMENT WITH PCP FOR REFILLS) (Patient taking differently: Take 25 mg by mouth 3 (three) times daily. (NEED APPOINTMENT WITH PCP FOR REFILLS)) 90 tablet 0   insulin degludec (TRESIBA FLEXTOUCH) 100 UNIT/ML FlexTouch Pen Inject 14 Units into the skin daily. 3 mL 1   isosorbide mononitrate (IMDUR) 30 MG 24 hr tablet  TAKE 1 TABLET BY MOUTH ONCE DAILY. 90 tablet 1   levETIRAcetam (KEPPRA) 500 MG tablet Take 1 tablet (500 mg total) by mouth daily. 60 tablet 0   Multiple Vitamins-Minerals (CENTRUM SILVER 50+MEN) TABS Take 1 tablet by mouth daily with breakfast.       NOVOLOG FLEXPEN 100 UNIT/ML FlexPen INJECT 5 UNITS SUBCUTANEOUSLY THREE TIMES DAILY WITH MEALS 3 mL 0   OXYGEN Inhale 4.5 L/min into the lungs continuous.       rosuvastatin (CRESTOR) 10 MG tablet TAKE 1 TABLET AT BEDTIME 90 tablet 3   Accu-Chek FastClix Lancets MISC USE  TO CHECK GLUCOSE AS INDICATED 204 each 3   DROPLET PEN NEEDLES 32G X 4 MM MISC USE AS DIRECTED WITH INSULIN 300 each 3   glucose blood (ACCU-CHEK GUIDE) test strip TEST BLOOD SUGAR TWICE DAILY 200 strip 2          Home: Home Living Family/patient expects to be discharged to:: Private residence Living Arrangements: Spouse/significant other Available Help at Discharge: Family Type of Home: Apartment Home Access: Stairs to enter Secretary/administrator of Steps: 15 Entrance Stairs-Rails: Right, Left, Can reach both Home Layout: One level Bathroom Shower/Tub: Associate Professor: Yes Home Equipment: None  Lives With: Spouse  Functional History: Prior Function Prior Level of Function : Independent/Modified Independent Functional Status:  Mobility: Bed Mobility Overal bed mobility: Needs Assistance Bed Mobility: Supine to Sit Supine to sit: Max assist, +2 for physical assistance General bed mobility comments: assist for trunk elevation and righting, scooting to EOB. LLE hooked under RLE to move LEs to EOB, step-by-step sequencing cues for bed mobility. Transfers Overall transfer level: Needs assistance Equipment used: 2 person hand held assist Transfers: Sit to/from Stand, Bed to chair/wheelchair/BSC Sit to Stand: +2 physical assistance, Max assist Bed to/from chair/wheelchair/BSC transfer type:: Stand pivot Stand pivot  transfers: Max assist, +2 physical assistance General transfer comment: assist for rise, RLE blocking, steadying, correcting R lateral bias. RLE buckling mid-transfer with OT blocking and OT-corrected, assist for safe lower into recliner. Ambulation/Gait General Gait Details: nt   ADL: ADL Overall ADL's : Needs assistance/impaired Eating/Feeding: Minimal assistance Eating/Feeding Details (indicate cue type and reason): thickecned liquids; increased time and cuing for precautions; using non-dominant L hand Grooming: Minimal assistance Grooming Details (indicate cue type and reason): able to brush teeth with [place] Upper Body Bathing: Moderate assistance Lower Body Bathing: Maximal assistance, Sit to/from stand Upper Body Dressing : Maximal assistance Lower Body Dressing: Total assistance, Sit to/from stand Toilet Transfer: Maximal assistance, +2 for safety/equipment Toileting- Clothing Manipulation and Hygiene: Total assistance Functional mobility during ADLs: Maximal assistance, +2 for physical assistance   Cognition: Cognition Overall Cognitive Status: Impaired/Different from baseline Arousal/Alertness: Awake/alert Orientation Level: Oriented X4 Cognition Arousal: Alert Behavior During Therapy: WFL for tasks assessed/performed Overall Cognitive Status: Impaired/Different from baseline Area of Impairment: Safety/judgement, Awareness, Memory Safety/Judgement: Decreased awareness of safety Awareness: Emergent General Comments: A&Ox4; unaware of pushing at times; will further assess   Blood pressure (!) 151/63, pulse 76, temperature 97.6 F (36.4 C), temperature source Oral, resp. rate (!) 21, height 5\' 11"  (1.803 m), weight 75.5 kg, SpO2 92%. Physical Exam Gen: no distress, normal appearing HEENT: oral mucosa pink and moist, NCAT Cardio: Reg rate Chest: normal effort, normal rate of breathing Abd: soft, non-distended Ext: no edema Psych: pleasant, normal affect Skin:  intact Neuro: Alert, right sided completely flaccid with flexor tone, decreased sensation throughout right side   Lab Results Last 24 Hours       Results for orders placed or performed during the hospital encounter of 09/06/23 (from the past 24 hour(s))  Triglycerides     Status: None    Collection Time: 09/08/23  5:32 AM  Result Value Ref Range    Triglycerides 62 <150 mg/dL       Imaging Results (Last 48 hours)  DG CHEST PORT 1 VIEW   Result Date: 09/06/2023 CLINICAL DATA:  Respiratory abnormality EXAM: PORTABLE CHEST 1 VIEW COMPARISON:  X-ray 04/10/2021.  CT 09/06/2023 earlier. FINDINGS: Enlarged cardiopericardial silhouette. Vascular congestion. Chronic lung changes. No pneumothorax, effusion  or consolidation. Slight thickening along the minor fissure. IMPRESSION: Enlarged cardiac silhouette.  Vascular congestion. Electronically Signed   By: Karen Kays M.D.   On: 09/06/2023 19:19    ECHOCARDIOGRAM COMPLETE   Result Date: 09/06/2023    ECHOCARDIOGRAM REPORT   Patient Name:   Kyle [name] Date of Exam: 09/06/2023 Medical Rec #:  [id number]      Height:       71.0 in Accession #:    [id number]     Weight:       166.4 lb Date of Birth:  30-Oct-1950      BSA:          1.950 m Patient Age:    72 years       BP:           134/109 mmHg Patient Gender: M              HR:           75 bpm. Exam Location:  Inpatient Procedure: 2D Echo, 3D Echo, Cardiac Doppler, Color Doppler and Strain Analysis Indications:    Stroke  History:        Patient has prior history of Echocardiogram examinations, most                 recent 04/05/2023. Risk Factors:Diabetes, Dyslipidemia and                 Hypertension.  Sonographer:    Karma Ganja Referring Phys: [id number] Marvel Plan  Sonographer Comments: Global longitudinal strain was attempted. IMPRESSIONS  1. Left ventricular ejection fraction, by estimation, is 55 to 60%. The left ventricle has normal function. The left ventricle has no regional wall motion  abnormalities. There is moderate concentric left ventricular hypertrophy. Left ventricular diastolic parameters are consistent with Grade I diastolic dysfunction (impaired relaxation). The average left ventricular global longitudinal strain is -15.5 %. The global longitudinal strain is abnormal.  2. Right ventricular systolic function is normal. The right ventricular size is normal. There is normal pulmonary artery systolic pressure. The estimated right ventricular systolic pressure is 35.6 mmHg.  3. Left atrial size was mildly dilated.  4. Right atrial size was mildly dilated.  5. The mitral valve is normal in structure. No evidence of mitral valve regurgitation. No evidence of mitral stenosis.  6. The aortic valve is tricuspid. There is severe calcifcation of the aortic valve. Aortic valve regurgitation is mild. Severe aortic valve stenosis. Aortic valve mean gradient measures 36.0 mmHg, AVA 0.95 cm^2.  7. The inferior vena cava is dilated in size with <50% respiratory variability, suggesting right atrial pressure of 15 mmHg. FINDINGS  Left Ventricle: Left ventricular ejection fraction, by estimation, is 55 to 60%. The left ventricle has normal function. The left ventricle has no regional wall motion abnormalities. The average left ventricular global longitudinal strain is -15.5 %. The global longitudinal strain is abnormal. The left ventricular internal cavity size was normal in size. There is moderate concentric left ventricular hypertrophy. Left ventricular diastolic parameters are consistent with Grade I diastolic dysfunction (impaired relaxation). Right Ventricle: The right ventricular size is normal. No increase in right ventricular wall thickness. Right ventricular systolic function is normal. There is normal pulmonary artery systolic pressure. The tricuspid regurgitant velocity is 2.27 m/s, and  with an assumed right atrial pressure of 15 mmHg, the estimated right ventricular systolic pressure is 35.6  mmHg. Left Atrium: Left atrial size was mildly dilated. Right Atrium: Right atrial size was  mildly dilated. Pericardium: There is no evidence of pericardial effusion. Mitral Valve: The mitral valve is normal in structure. There is mild calcification of the mitral valve leaflet(s). Mild mitral annular calcification. No evidence of mitral valve regurgitation. No evidence of mitral valve stenosis. Tricuspid Valve: The tricuspid valve is normal in structure. Tricuspid valve regurgitation is trivial. Aortic Valve: The aortic valve is tricuspid. There is severe calcifcation of the aortic valve. Aortic valve regurgitation is mild. Aortic regurgitation PHT measures 509 msec. Severe aortic stenosis is present. Aortic valve mean gradient measures 36.0 mmHg. Aortic valve peak gradient measures 59.7 mmHg. Aortic valve area, by VTI measures 1.03 cm. Pulmonic Valve: The pulmonic valve was normal in structure. Pulmonic valve regurgitation is not visualized. Aorta: The aortic root is normal in size and structure. Venous: The inferior vena cava is dilated in size with less than 50% respiratory variability, suggesting right atrial pressure of 15 mmHg. IAS/Shunts: No atrial level shunt detected by color flow Doppler.  LEFT VENTRICLE PLAX 2D LVIDd:         4.70 cm   Diastology LVIDs:         2.70 cm   LV e' medial:    5.55 cm/s LV PW:         1.40 cm   LV E/e' medial:  12.9 LV IVS:        1.20 cm   LV e' lateral:   8.05 cm/s LVOT diam:     2.00 cm   LV E/e' lateral: 8.9 LV SV:         76 LV SV Index:   39        2D Longitudinal Strain LVOT Area:     3.14 cm  2D Strain GLS Avg:     -15.5 %                           3D Volume EF:                          3D EF:        50 %                          LV EDV:       172 ml                          LV ESV:       86 ml                          LV SV:        87 ml RIGHT VENTRICLE             IVC RV Basal diam:  4.60 cm     IVC diam: 2.20 cm RV S prime:     16.20 cm/s TAPSE (M-mode): 3.2 cm  LEFT ATRIUM             Index        RIGHT ATRIUM           Index LA diam:        3.60 cm 1.85 cm/m   RA Area:     23.40 cm LA Vol (A2C):   75.6 ml 38.76 ml/m  RA Volume:   73.60 ml  37.74 ml/m LA Vol (  A4C):   56.3 ml 28.87 ml/m LA Biplane Vol: 67.3 ml 34.51 ml/m  AORTIC VALVE AV Area (Vmax):    0.98 cm AV Area (Vmean):   0.96 cm AV Area (VTI):     1.03 cm AV Vmax:           386.33 cm/s AV Vmean:          271.667 cm/s AV VTI:            0.738 m AV Peak Grad:      59.7 mmHg AV Mean Grad:      36.0 mmHg LVOT Vmax:         120.00 cm/s LVOT Vmean:        83.200 cm/s LVOT VTI:          0.242 m LVOT/AV VTI ratio: 0.33 AI PHT:            509 msec  AORTA Ao Root diam: 3.40 cm Ao Asc diam:  3.20 cm MITRAL VALVE                TRICUSPID VALVE MV Area (PHT): 3.99 cm     TR Peak grad:   20.6 mmHg MV Decel Time: 190 msec     TR Vmax:        227.00 cm/s MV E velocity: 71.70 cm/s MV A velocity: 108.00 cm/s  SHUNTS MV E/A ratio:  0.66         Systemic VTI:  0.24 m                             Systemic Diam: 2.00 cm Dalton McleanMD Electronically signed by Wilfred Lacy Signature Date/Time: 09/06/2023/4:34:36 PM    Final        Assessment/Plan: Diagnosis: ICH Does the need for close, 24 hr/day medical supervision in concert with the patient's rehab needs make it unreasonable for this patient to be served in a less intensive setting? Yes Co-Morbidities requiring supervision/potential complications:  1) HTN: continue to monitor BP TID 2) AKI: continue to monitor creatinine 3) Prediabetes: conitnue to monitor blood sugars 4) Flexor tone: consider starting baclofen HS 5) Constipation: continue Senna-docusate   Due to bladder management, bowel management, safety, skin/wound care, disease management, medication administration, pain management, and patient education, does the patient require 24 hr/day rehab nursing? Yes Does the patient require coordinated care of a physician, rehab nurse, therapy disciplines of  PT, OT, SLP to address physical and functional deficits in the context of the above medical diagnosis(es)? Yes Addressing deficits in the following areas: balance, endurance, locomotion, strength, transferring, bowel/bladder control, bathing, dressing, feeding, grooming, toileting, and psychosocial support Can the patient actively participate in an intensive therapy program of at least 3 hrs of therapy per day at least 5 days per week? Yes The potential for patient to make measurable gains while on inpatient rehab is excellent Anticipated functional outcomes upon discharge from inpatient rehab are min assist  with PT, min assist with OT, min assist with SLP. Estimated rehab length of stay to reach the above functional goals is: 12-16 days Anticipated discharge destination: Home Overall Rehab/Functional Prognosis: fair   POST ACUTE RECOMMENDATIONS: This patient's condition is appropriate for continued rehabilitative care in the following setting: CIR Patient has agreed to participate in recommended program. Yes Note that insurance prior authorization may be required for reimbursement for recommended care.       I have personally performed a face  to face diagnostic evaluation of this patient. Additionally, I have examined the patient's medical record including any pertinent labs and radiographic images. If the physician assistant has documented in this note, I have reviewed and edited or otherwise concur with the physician assistant's documentation.   Thanks,   Horton Chin, MD 09/08/2023          Routing History

## 2023-09-10 NOTE — Progress Notes (Addendum)
Ranelle Oyster, MD  Physician Physical Medicine and Rehabilitation   PMR Pre-admission    Signed   Date of Service: 09/08/2023  4:14 PM  Related encounter: ED to Hosp-Admission (Current) from 09/06/2023 in [place] [facility]   Signed     Expand All Collapse All  Show:Clear all [x] Written[x] Templated[x] Copied  Added by: [x] Standley Brooking, RN  [] Hover for details PMR Admission Coordinator Pre-Admission Assessment   Patient: Kyle [name] is an 72 y.o., male MRN: [id number] DOB: 1951/07/03 Height: 5\' 11"  (180.3 cm) Weight: 78.9 kg                                                                                                                                                  Insurance Information HMO: yes    PPO:      PCP:      IPA:      80/20:      OTHER:  PRIMARY: [employer]      Policy#: [id number]; Medicare # [id number]      Subscriber: pt CM Name: [name]      Phone#: [phone] ext [phone]     Fax#: [phone] Pre-Cert#: [id number] 11/15 for 7 days until  11/22   f/u with Danella Penton phone [phone] ext [phone] fax [phone] Employer:  Benefits:  Phone #: [phone]     Name: 11/13 Eff. Date: 10/26/22     Deduct: none      Out of Pocket Max: $7750      Life Max:   CIR: $335 co pay per day days 1 until 6      SNF: no co pay per day days 1 until 20; $203 co pay per day days 21 until 100 Outpatient: $25 co pay per visit     Co-Pay:  Home Health: 100%      Co-Pay:  DME: 80%     Co-Pay: 20% Providers: in network  SECONDARY: none      Policy#:       Phone#:    Artist:       Phone#:    The Data processing manager" for patients in Inpatient Rehabilitation Facilities with attached "Privacy Act Statement-Health Care Records" was provided and verbally reviewed with: Patient and Family   Emergency Contact Information Contact Information       Name Relation Home Work Mobile    Bodnar,[name]  [phone]   [phone]         Other Contacts   None on File      Current Medical History  Patient Admitting Diagnosis: ICH   History of Present Illness: 72 year old male with history of dilated cardiomyopathy, COPD on home O2 at 4.5 liters at baseline, PAD, seizures, Afib  on Eliquis, fem pop bypass 2010, aortobifemoral bypass in 2016  who presented on  09/06/23  to [facility] hospital with right arm weakness and difficulty speaking.    CT showed left thalamic hemorrhage. Given Kcentra for ELiquis reversal. CTA head and neck No CTA spot sign or vascular malformation at the left lentiform hemorrhage. 2 d echo EF 55 to 60 %. Now on no anticoagulation.  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 VVS felt chronic and plan to repair in 1 month as an outpatient. CTA Chest/Abd/Pel- Extensive aortic atherosclerosis with multiple penetrating ulcers and ulcerated plaques. 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 Chronic respiratory failure on 4.5 liters at home. Placed on HFNC 10 liters but has weaned down to 6 liters HFNC with activity. CXR with vascular congestion. On lasix daily. For HTN, CHF and dilated cardiomyopathy now on amlodipine, Coreg, Imdur, hydralazine, lasix and clonidine. LDL 61 and Crestor resumed. SLP assessed and on full liquid diet with nectar thick liquids. On Keppra for history of seizures.    Complete NIHSS TOTAL: 11 Glasgow Coma Scale Score: 15   Patient's medical record from [facility] has been reviewed by the rehabilitation admission coordinator and physician.   Past Medical History      Past Medical History:  Diagnosis Date   Aortic insufficiency      MODERATE WITH A BICUSPID AORTIC VAVLE   Arterial occlusive disease      MULTILEVEL    CHF (congestive heart failure) (HCC)     Chronic bronchitis (HCC)     CKD (chronic kidney disease) stage 3, GFR 30-59 ml/min (HCC)     COPD (chronic obstructive pulmonary disease) (HCC)     Dilated cardiomyopathy (HCC)      WITH EJECTION FRACTION DOWN TO 20-25%--WITH CONGESTIVE HEART FAILURE   Edema      LOWER EXTREMETIES   Emphysema of lung (HCC)     Hyperglycemia 01/2020   Hypertension     Normal coronary arteries 2009   Orthopnea     Peripheral arterial disease (HCC)     Pulmonary hypertension (HCC)          Has the patient had major surgery during 100 days prior to admission? No   Family History  family history includes Diabetes in his mother; Hyperlipidemia in his sister; Hypertension in his sister.   Current Medications   Current Medications    Current Facility-Administered Medications:    acetaminophen (TYLENOL) tablet 650 mg, 650 mg, Oral, Q4H PRN **OR** acetaminophen (TYLENOL) 160 MG/5ML solution 650 mg, 650 mg, Per Tube, Q4H PRN **OR** acetaminophen (TYLENOL) suppository 650 mg, 650 mg, Rectal, Q4H PRN, Erick Blinks, MD   albuterol (PROVENTIL) (2.5 MG/3ML) 0.083% nebulizer solution 3 mL, 3 mL, Inhalation, Q6H PRN, Pamalee Leyden, Devon, NP   amLODipine (NORVASC) tablet 10 mg, 10 mg, Oral, Daily, Marvel Plan, MD, 10 mg at 09/10/23 0917   carvedilol (COREG) tablet 6.25 mg, 6.25 mg, Oral, BID WC, Marvel Plan, MD, 6.25 mg at 09/10/23 6237   Chlorhexidine Gluconate Cloth 2 % PADS 6 each, 6 each, Topical, Daily, Caryl Pina, MD, 6 each at 09/09/23 0852   cloNIDine (CATAPRES) tablet 0.2 mg, 0.2 mg, Oral, Q8H, Marvel Plan, MD, 0.2 mg at 09/10/23 0541   fluticasone furoate-vilanterol (BREO ELLIPTA) 200-25 MCG/ACT 1 puff, 1 puff, Inhalation, Daily **AND** umeclidinium bromide (INCRUSE ELLIPTA) 62.5 MCG/ACT 1 puff, 1 puff,  Inhalation, Daily, Erick Alley, DO   furosemide (LASIX) tablet 20 mg, 20 mg, Oral, Daily, Marvel Plan, MD, 20 mg at 09/10/23 1610   hydrALAZINE (APRESOLINE)  injection 20 mg, 20 mg, Intravenous, Q4H PRN, Pamalee Leyden, Devon, NP, 20 mg at 09/08/23 0802   hydrALAZINE (APRESOLINE) tablet 100 mg, 100 mg, Oral, TID, Pamalee Leyden, Devon, NP, 100 mg at 09/10/23 0916   isosorbide mononitrate (IMDUR) 24 hr tablet 30 mg, 30 mg, Oral, Daily, Shafer, Devon, NP, 30 mg at 09/10/23 0917   labetalol (NORMODYNE) injection 20 mg, 20 mg, Intravenous, Q2H PRN, Pamalee Leyden, Devon, NP, 20 mg at 09/08/23 0636   levETIRAcetam (KEPPRA) tablet 500 mg, 500 mg, Oral, BID, Marvel Plan, MD, 500 mg at 09/10/23 9604   Oral care mouth rinse, 15 mL, Mouth Rinse, PRN, Caryl Pina, MD, 15 mL at 09/06/23 2210   pantoprazole (PROTONIX) EC tablet 40 mg, 40 mg, Oral, Daily, Marvel Plan, MD, 40 mg at 09/10/23 0915   rosuvastatin (CRESTOR) tablet 10 mg, 10 mg, Oral, QHS, Marvel Plan, MD, 10 mg at 09/09/23 2254   senna-docusate (Senokot-S) tablet 1 tablet, 1 tablet, Oral, BID, Erick Blinks, MD, 1 tablet at 09/10/23 5409     Patients Current Diet:  Diet Order                  DIET - DYS 1 Room service appropriate? Yes with Assist; Fluid consistency: Nectar Thick  Diet effective now                       Precautions / Restrictions Precautions Precautions: Fall Precaution Comments: R hemiplegia Restrictions Weight Bearing Restrictions: No    Has the patient had 2 or more falls or a fall with injury in the past year?No   Prior Activity Level Community (5-7x/wk): independent, retired and driving   Prior Functional Level Prior Function Prior Level of Function : Independent/Modified Independent   Self Care: Did the patient need help bathing, dressing, using the toilet or eating?  Independent   Indoor Mobility: Did the patient need assistance with walking from room to room (with or without device)? Independent   Stairs: Did the patient need assistance with internal or external stairs (with or without device)? Independent   Functional Cognition: Did the patient need help planning  regular tasks such as shopping or remembering to take medications? Independent   Patient Information Are you of Hispanic, Latino/a,or Spanish origin?: A. No, not of Hispanic, Latino/a, or Spanish origin What is your race?: B. Black or African American Do you need or want an interpreter to communicate with a doctor or health care staff?: 0. No   Patient's Response To:  Health Literacy and Transportation Is the patient able to respond to health literacy and transportation needs?: Yes Health Literacy - How often do you need to have someone help you when you read instructions, pamphlets, or other written material from your doctor or pharmacy?: Never In the past 12 months, has lack of transportation kept you from medical appointments or from getting medications?: No In the past 12 months, has lack of transportation kept you from meetings, work, or from getting things needed for daily living?: No   Home Assistive Devices / Equipment Home Equipment: None   Prior Device Use: Indicate devices/aids used by the patient prior to current illness, exacerbation or injury? None of the above   Current Functional Level Cognition   Arousal/Alertness: Awake/alert Overall Cognitive Status: Impaired/Different from baseline Current Attention Level: Focused Orientation  Level: Oriented X4 Following Commands: Follows one step commands inconsistently, Follows multi-step commands with increased time Safety/Judgement: Decreased awareness of safety, Decreased awareness of deficits General Comments: unaware of pushing to R    Extremity Assessment (includes Sensation/Coordination)   Upper Extremity Assessment: RUE deficits/detail, Right hand dominant RUE Deficits / Details: non-functional; no active movement noted; Brunstrom 1 RUE Sensation: decreased light touch RUE Coordination: decreased fine motor, decreased gross motor  Lower Extremity Assessment: Defer to PT evaluation RLE Deficits / Details: 1/5 hip  flexion, knee extension. otherwise 0/5 RLE Sensation:  (pt denies altered sensation)     ADLs   Overall ADL's : Needs assistance/impaired Eating/Feeding: Minimal assistance Eating/Feeding Details (indicate cue type and reason): thickecned liquids; increased time and cuing for precautions; using non-dominant L hand Grooming: Wash/dry face, Supervision/safety, Set up Grooming Details (indicate cue type and reason): from bed level Upper Body Bathing: Maximal assistance, Sitting Upper Body Bathing Details (indicate cue type and reason): simulated via UB dressing from EOB Lower Body Bathing: Maximal assistance, Sit to/from stand Upper Body Dressing : Maximal assistance, Sitting Upper Body Dressing Details (indicate cue type and reason): to don new gowns from EOB Lower Body Dressing: Total assistance, Sit to/from stand Toilet Transfer: Maximal assistance, +2 for safety/equipment, Stand-pivot Toilet Transfer Details (indicate cue type and reason): simulated via stand pivot to recliner Toileting- Clothing Manipulation and Hygiene: Total assistance Functional mobility during ADLs: Maximal assistance, +2 for physical assistance, Cueing for safety, Cueing for sequencing General ADL Comments: ADL participation impacted by impaired balance, RUE hemiplegia, and impaired proprioception and motor planning     Mobility   Overal bed mobility: Needs Assistance Bed Mobility: Sit to Supine, Supine to Sit Rolling: Min assist Sidelying to sit: Max assist Supine to sit: Mod assist Sit to supine: Min assist General bed mobility comments: mod A to bring RLE to and off EOB and to elevate trunk to mindline, min A to return RLE to bed, pt able to self lower trunk and reposition midline and scoot up toward HOB in supine with LUE and LLE     Transfers   Overall transfer level: Needs assistance Equipment used: 1 person hand held assist Transfers: Sit to/from Stand, Bed to chair/wheelchair/BSC Sit to Stand: Max  assist Bed to/from chair/wheelchair/BSC transfer type:: Stand pivot Stand pivot transfers: Max assist General transfer comment: RLE blocked during sit>stands x3, max cues to extend LLE as pt with tendency to keep L knee flexed in standing, pt unable to step RLE and impulsively sitting back down,     Ambulation / Gait / Stairs / Wheelchair Mobility   Ambulation/Gait General Gait Details: nt     Posture / Balance Dynamic Sitting Balance Sitting balance - Comments: moments of CGA from EOB with unilateral support from LUE but noted to require up to MODA d/t pushing to R side and pushing posterior Balance Overall balance assessment: Needs assistance Sitting-balance support: Feet supported, Single extremity supported Sitting balance-Leahy Scale: Poor Sitting balance - Comments: moments of CGA from EOB with unilateral support from LUE but noted to require up to MODA d/t pushing to R side and pushing posterior Postural control: Right lateral lean, Posterior lean Standing balance support: Bilateral upper extremity supported Standing balance-Leahy Scale: Poor Standing balance comment: pt required BUE support in standing from therapists with RLE blocked and assist at trunk to keep trunk elevated and upright     Special needs/care consideration   COPD on 4.5 liters [place] at Baseline  Previous Home Environment  Living Arrangements: Spouse/significant other  Lives With: Spouse Available Help at Discharge: Family, Available 24 hours/day (wife) Type of Home: Apartment Home Layout: One level Home Access: Stairs to enter Entrance Stairs-Rails: Right, Left, Can reach both Entrance Stairs-Number of Steps: 15 Bathroom Shower/Tub: Associate Professor: Yes Home Care Services: No   Discharge Living Setting Plans for Discharge Living Setting: Patient's home, Apartment, Lives with (comment) (wife) Type of Home at Discharge: Apartment Discharge Home  Layout: One level Discharge Home Access: Stairs to enter Entrance Stairs-Rails: Right, Left, Can reach both Entrance Stairs-Number of Steps: 15 Discharge Bathroom Shower/Tub: Tub/shower unit Discharge Bathroom Toilet: Standard Discharge Bathroom Accessibility: Yes How Accessible: Accessible via walker Does the patient have any problems obtaining your medications?: No   Second floor apartment for 22 years. Wife stated dtr and son in law will assist getting him in and out of apartment. They live in [place], [name]. Do not want to move to first floor.   Social/Family/Support Systems Patient Roles: Spouse Contact Information: wife, [place] Anticipated Caregiver: wife Anticipated Caregiver's Contact Information: see contacts Ability/Limitations of Caregiver: no limitations. History of CVA 2002, no residual deficits Caregiver Availability: 24/7 Discharge Plan Discussed with Primary Caregiver: Yes Is Caregiver In Agreement with Plan?: Yes Does Caregiver/Family have Issues with Lodging/Transportation while Pt is in Rehab?: No   Goals Patient/Family Goal for Rehab: min asisst with PT and OT and SLP Expected length of stay: ELOS 12 to 16 days Pt/Family Agrees to Admission and willing to participate: Yes Program Orientation Provided & Reviewed with Pt/Caregiver Including Roles  & Responsibilities: Yes   Decrease burden of Care through IP rehab admission: n/a   Possible need for SNF placement upon discharge:not anticipated   Patient Condition: This patient's medical and functional status has changed since the consult dated: 09/08/23 in which the Rehabilitation Physician determined and documented that the patient's condition is appropriate for intensive rehabilitative care in an inpatient rehabilitation facility. See "History of Present Illness" (above) for medical update. Functional changes are: max assist. Patient's medical and functional status update has been discussed with the Rehabilitation  physician and patient remains appropriate for inpatient rehabilitation. Will admit to inpatient rehab today.   Preadmission Screen Completed By:  Clois Dupes, RN MSN 09/10/2023 11:28 AM ______________________________________________________________________   Discussed status with Dr. Riley Kill on 09/10/23 at 1131 and received approval for admission today.   Admission Coordinator:  Clois Dupes RN MSN time 6045 Date 09/10/23            Revision History

## 2023-09-10 NOTE — Assessment & Plan Note (Deleted)
On 4.5L oxygen at home. Now on 6L with saturations >90. --continue to wean oxygen back to home level as tolerated  --Duoneb nebulizer BID --lasix 20mg  daily --albuterol nebulizer q6 PRN

## 2023-09-10 NOTE — Assessment & Plan Note (Deleted)
Acute hemorrhage in L lentiform nuclei, likely 2/2 uncontrolled HTN and Eliquis use. Reversed Eliquis with Kcentra. Blood pressure well controlled now. --holding Eliquis --amlodipine 10mg , coreg 6.25 BID, imdur 30, hydralazine 100mg  TID, lasix 20mg , clonidine 0.2 TID --PRN labetalol and hydralazine --blood pressure goal <140 systolic --SLP eval and treat for dysphagia --PT/OT recommend CIR, pending placement

## 2023-09-10 NOTE — Assessment & Plan Note (Deleted)
At the R groin, likely chronic per vascular surgery. -vascular surgery to repair outpatient in 1 month

## 2023-09-10 NOTE — Plan of Care (Signed)
Problem: Education: Goal: Knowledge of disease or condition will improve 09/10/2023 0621 by Kyle [name], Alain Honey, RN Outcome: Progressing 09/10/2023 0512 by Kyle [name], Linnea Todisco M, RN Outcome: Progressing Goal: Knowledge of secondary prevention will improve (MUST DOCUMENT ALL) 09/10/2023 0621 by Kyle [name], Zaven Klemens Judie Petit, RN Outcome: Progressing 09/10/2023 0512 by Kyle [name], Renwick Asman M, RN Outcome: Progressing Goal: Knowledge of patient specific risk factors will improve Loraine Leriche N/A or DELETE if not current risk factor) 09/10/2023 0621 by Kyle [name], Alain Honey, RN Outcome: Progressing 09/10/2023 0512 by Kyle [name], Sumiye Hirth M, RN Outcome: Progressing   Problem: Intracerebral Hemorrhage Tissue Perfusion: Goal: Complications of Intracerebral Hemorrhage will be minimized 09/10/2023 6295 by Kyle [name], Alain Honey, RN Outcome: Progressing 09/10/2023 0512 by Kyle [name], Viney Acocella M, RN Outcome: Progressing   Problem: Coping: Goal: Will verbalize positive feelings about self 09/10/2023 0621 by Kyle [name], Alain Honey, RN Outcome: Progressing 09/10/2023 0512 by Kyle [name], Alain Honey, RN Outcome: Progressing Goal: Will identify appropriate support needs 09/10/2023 0621 by Kyle [name], Alain Honey, RN Outcome: Progressing 09/10/2023 0512 by Kyle [name], Latanja Lehenbauer Judie Petit, RN Outcome: Progressing   Problem: Health Behavior/Discharge Planning: Goal: Ability to manage health-related needs will improve 09/10/2023 0621 by Kyle [name], Alain Honey, RN Outcome: Progressing 09/10/2023 0512 by Kyle [name], Alain Honey, RN Outcome: Progressing Goal: Goals will be collaboratively established with patient/family 09/10/2023 [phone] by Kyle [name], Alain Honey, RN Outcome: Progressing 09/10/2023 0512 by Kyle [name], Soley Harriss M, RN Outcome: Progressing   Problem: Self-Care: Goal: Ability to participate in self-care as condition permits will improve 09/10/2023 0621 by Kyle [name], Alain Honey, RN Outcome: Progressing 09/10/2023 0512 by Kyle [name], Icel Castles M, RN Outcome: Progressing Goal: Verbalization of feelings and concerns over difficulty with self-care will improve 09/10/2023 0621 by Kyle [name], Alain Honey, RN Outcome: Progressing 09/10/2023 0512 by Kyle [name], Debany Vantol M, RN Outcome: Progressing Goal: Ability to communicate needs accurately will improve 09/10/2023 0621 by Kyle [name], Alain Honey, RN Outcome: Progressing 09/10/2023 0512 by Kyle [name], Daniel Johndrow M, RN Outcome: Progressing   Problem: Nutrition: Goal: Risk of aspiration will decrease 09/10/2023 0621 by Kyle [name], Alain Honey, RN Outcome: Progressing 09/10/2023 0512 by Kyle [name], Moranda Billiot Judie Petit, RN Outcome: Progressing Goal: Dietary intake will improve 09/10/2023 0621 by Kyle [name], Alain Honey, RN Outcome: Progressing 09/10/2023 0512 by Kyle [name], Kamon Fahr Judie Petit, RN Outcome: Progressing   Problem: Education: Goal: Knowledge of General Education information will improve Description: Including pain rating scale, medication(s)/side effects and non-pharmacologic comfort measures 09/10/2023 0621 by Kyle [name], Alain Honey, RN Outcome: Progressing 09/10/2023 0512 by Kyle [name], Varnell Orvis Judie Petit, RN Outcome: Progressing   Problem: Health Behavior/Discharge Planning: Goal: Ability to manage health-related needs will improve 09/10/2023 0621 by Kyle [name], Alain Honey, RN Outcome: Progressing 09/10/2023 0512 by Kyle [name], Staphanie Harbison M, RN Outcome: Progressing   Problem: Clinical Measurements: Goal: Ability to maintain clinical measurements within normal limits will improve 09/10/2023 0621 by Kyle [name], Alain Honey, RN Outcome: Progressing 09/10/2023 0512 by Kyle [name], Amayrany Cafaro M, RN Outcome: Progressing Goal: Will remain free from infection 09/10/2023 0621 by Kyle [name], Alain Honey, RN Outcome: Progressing 09/10/2023 0512 by Kyle [name], Alain Honey, RN Outcome:  Progressing Goal: Diagnostic test results will improve 09/10/2023 0621 by Kyle [name], Alain Honey, RN Outcome: Progressing 09/10/2023 0512 by Kyle [name], Alain Honey, RN Outcome: Progressing Goal: Respiratory complications will improve 09/10/2023 0621 by Kyle [name], Alain Honey, RN Outcome: Progressing 09/10/2023 0512 by Kyle [name], Jarry Manon M, RN Outcome: Progressing Goal: Cardiovascular complication will be avoided  09/10/2023 0621 by Kyle [name], Alain Honey, RN Outcome: Progressing 09/10/2023 0512 by Kyle [name], Jeryl Umholtz Judie Petit, RN Outcome: Progressing   Problem: Activity: Goal: Risk for activity intolerance will decrease 09/10/2023 0621 by Kyle [name], Ambert Virrueta Judie Petit, RN Outcome: Progressing 09/10/2023 0512 by Kyle [name], Tanya Crothers M, RN Outcome: Progressing   Problem: Nutrition: Goal: Adequate nutrition will be maintained 09/10/2023 0621 by Kyle [name], Alain Honey, RN Outcome: Progressing 09/10/2023 0512 by Kyle [name], Sada Mazzoni M, RN Outcome: Progressing   Problem: Coping: Goal: Level of anxiety will decrease 09/10/2023 0621 by Kyle [name], Alain Honey, RN Outcome: Progressing 09/10/2023 0512 by Kyle [name], Jaxson Anglin M, RN Outcome: Progressing   Problem: Elimination: Goal: Will not experience complications related to bowel motility 09/10/2023 0621 by Kyle [name], Alain Honey, RN Outcome: Progressing 09/10/2023 0512 by Kyle [name], Shanise Balch Judie Petit, RN Outcome: Progressing Goal: Will not experience complications related to urinary retention 09/10/2023 0621 by Kyle [name], Alain Honey, RN Outcome: Progressing 09/10/2023 0512 by Kyle [name], Angelee Bahr Judie Petit, RN Outcome: Progressing   Problem: Pain Management: Goal: General experience of comfort will improve 09/10/2023 0621 by Kyle [name], Alain Honey, RN Outcome: Progressing 09/10/2023 0512 by Kyle [name], Enmanuel Zufall M, RN Outcome: Progressing   Problem: Safety: Goal: Ability to remain free from injury will  improve 09/10/2023 0621 by Kyle [name], Alain Honey, RN Outcome: Progressing 09/10/2023 0512 by Kyle [name], Lakelynn Severtson M, RN Outcome: Progressing   Problem: Skin Integrity: Goal: Risk for impaired skin integrity will decrease 09/10/2023 0621 by Kyle [name], Alain Honey, RN Outcome: Progressing 09/10/2023 0512 by Kyle [name], Alain Honey, RN Outcome: Progressing

## 2023-09-10 NOTE — Progress Notes (Signed)
Patient transferred to report given to Nurse [facility].  Brinley Treanor, Kae Heller, RN'

## 2023-09-10 NOTE — TOC Transition Note (Signed)
Transition of Care [facility]) - CM/SW Discharge Note   Patient Details  Name: Kyle [name] MRN: [id number] Date of Birth: [birth year]  Transition of Care [facility] CM/SW Contact:  Kermit Balo, RN Phone Number: 09/10/2023, 10:56 AM   Clinical Narrative:     Pt is discharging to CIR today. CM signing off.   Final next level of care: IP Rehab Facility Barriers to Discharge: No Barriers Identified   Patient Goals and CMS Choice CMS Medicare.gov Compare Post Acute Care list provided to:: Patient Choice offered to / list presented to : Patient  Discharge Placement                         Discharge Plan and Services Additional resources added to the After Visit Summary for                                       Social Determinants of Health (SDOH) Interventions SDOH Screenings   Food Insecurity: No Food Insecurity (03/05/2023)  Housing: Low Risk  (03/05/2023)  Transportation Needs: No Transportation Needs (03/05/2023)  Utilities: Not At Risk (03/05/2023)  Alcohol Screen: Low Risk  (03/05/2023)  Depression (PHQ2-9): Low Risk  (03/05/2023)  Financial Resource Strain: Low Risk  (03/05/2023)  Physical Activity: Sufficiently Active (03/05/2023)  Social Connections: Socially Integrated (03/05/2023)  Stress: No Stress Concern Present (03/05/2023)  Tobacco Use: Medium Risk (09/06/2023)     Readmission Risk Interventions     No data to display

## 2023-09-10 NOTE — Progress Notes (Signed)
Ortho called for prafo. Product will be delivered once available.

## 2023-09-11 ENCOUNTER — Other Ambulatory Visit: Payer: Self-pay | Admitting: Student

## 2023-09-11 DIAGNOSIS — I5022 Chronic systolic (congestive) heart failure: Secondary | ICD-10-CM | POA: Diagnosis not present

## 2023-09-11 DIAGNOSIS — I610 Nontraumatic intracerebral hemorrhage in hemisphere, subcortical: Secondary | ICD-10-CM

## 2023-09-11 DIAGNOSIS — R208 Other disturbances of skin sensation: Secondary | ICD-10-CM | POA: Diagnosis not present

## 2023-09-11 DIAGNOSIS — I482 Chronic atrial fibrillation, unspecified: Secondary | ICD-10-CM | POA: Diagnosis not present

## 2023-09-11 LAB — CBC WITH DIFFERENTIAL/PLATELET
Abs Immature Granulocytes: 0.03 10*3/uL (ref 0.00–0.07)
Basophils Absolute: 0 10*3/uL (ref 0.0–0.1)
Basophils Relative: 0 %
Eosinophils Absolute: 0.1 10*3/uL (ref 0.0–0.5)
Eosinophils Relative: 1 %
HCT: 37.9 % — ABNORMAL LOW (ref 39.0–52.0)
Hemoglobin: 12.1 g/dL — ABNORMAL LOW (ref 13.0–17.0)
Immature Granulocytes: 0 %
Lymphocytes Relative: 13 %
Lymphs Abs: 1 10*3/uL (ref 0.7–4.0)
MCH: 29.2 pg (ref 26.0–34.0)
MCHC: 31.9 g/dL (ref 30.0–36.0)
MCV: 91.5 fL (ref 80.0–100.0)
Monocytes Absolute: 1.3 10*3/uL — ABNORMAL HIGH (ref 0.1–1.0)
Monocytes Relative: 17 %
Neutro Abs: 5.4 10*3/uL (ref 1.7–7.7)
Neutrophils Relative %: 69 %
Platelets: 149 10*3/uL — ABNORMAL LOW (ref 150–400)
RBC: 4.14 MIL/uL — ABNORMAL LOW (ref 4.22–5.81)
RDW: 15 % (ref 11.5–15.5)
WBC: 7.9 10*3/uL (ref 4.0–10.5)
nRBC: 0 % (ref 0.0–0.2)

## 2023-09-11 LAB — GLUCOSE, CAPILLARY
Glucose-Capillary: 130 mg/dL — ABNORMAL HIGH (ref 70–99)
Glucose-Capillary: 183 mg/dL — ABNORMAL HIGH (ref 70–99)
Glucose-Capillary: 226 mg/dL — ABNORMAL HIGH (ref 70–99)
Glucose-Capillary: 128 mg/dL — ABNORMAL HIGH (ref 70–99)

## 2023-09-11 LAB — COMPREHENSIVE METABOLIC PANEL
ALT: 23 U/L (ref 0–44)
AST: 20 U/L (ref 15–41)
Albumin: 2.8 g/dL — ABNORMAL LOW (ref 3.5–5.0)
Alkaline Phosphatase: 44 U/L (ref 38–126)
Anion gap: 11 (ref 5–15)
BUN: 33 mg/dL — ABNORMAL HIGH (ref 8–23)
CO2: 24 mmol/L (ref 22–32)
Calcium: 8.8 mg/dL — ABNORMAL LOW (ref 8.9–10.3)
Chloride: 108 mmol/L (ref 98–111)
Creatinine, Ser: 1.81 mg/dL — ABNORMAL HIGH (ref 0.61–1.24)
GFR, Estimated: 39 mL/min — ABNORMAL LOW (ref >60)
Glucose, Bld: 129 mg/dL — ABNORMAL HIGH (ref 70–99)
Potassium: 4 mmol/L (ref 3.5–5.1)
Sodium: 143 mmol/L (ref 135–145)
Total Bilirubin: 1 mg/dL (ref <1.2)
Total Protein: 6.2 g/dL — ABNORMAL LOW (ref 6.5–8.1)

## 2023-09-11 MED ORDER — ENOXAPARIN SODIUM 30 MG/0.3ML IJ SOSY
30.0000 mg | PREFILLED_SYRINGE | INTRAMUSCULAR | Status: DC
  Administered 2023-09-11 – 2023-09-21 (×11): 30 mg via SUBCUTANEOUS
  Filled 2023-09-11 (×11): qty 0.3

## 2023-09-11 NOTE — Evaluation (Signed)
Physical Therapy Assessment and Plan  Patient Details  Name: Kyle [name] MRN: [id number] Date of Birth: 18-Apr-1951  PT Diagnosis: Abnormal posture, Difficulty walking, and Hemiplegia dominant Rehab Potential: Fair ELOS: 3-4 weeks.   Today's Date: 09/11/2023 PT Individual Time:  -       Hospital Problem: Principal Problem:   ICH (intracerebral hemorrhage) (HCC)   Past Medical History:  Past Medical History:  Diagnosis Date   Aortic insufficiency    MODERATE WITH A BICUSPID AORTIC VAVLE   Arterial occlusive disease    MULTILEVEL   Atrial fibrillation (HCC)    CHF (congestive heart failure) (HCC)    Chronic bronchitis (HCC)    CKD (chronic kidney disease) stage 3, GFR 30-59 ml/min (HCC)    COPD (chronic obstructive pulmonary disease) (HCC)    Dilated cardiomyopathy (HCC)    WITH EJECTION FRACTION DOWN TO 20-25%--WITH CONGESTIVE HEART FAILURE   Edema    LOWER EXTREMETIES   Emphysema of lung (HCC)    Hypertension    Insulin dependent type 2 diabetes mellitus (HCC)    Normal coronary arteries 2009   Orthopnea    Peripheral arterial disease (HCC)    Pulmonary hypertension (HCC)    Past Surgical History:  Past Surgical History:  Procedure Laterality Date   ABDOMINAL AORTAGRAM N/A 11/05/2014   Procedure: ABDOMINAL Ronny Flurry;  Surgeon: Chuck Hint, MD;  Location: [facility] CATH LAB;  Service: Cardiovascular;  Laterality: N/A;   ABDOMINAL AORTOGRAM W/LOWER EXTREMITY Bilateral 04/21/2019   Procedure: ABDOMINAL AORTOGRAM W/LOWER EXTREMITY;  Surgeon: Chuck Hint, MD;  Location: [facility] INVASIVE CV LAB;  Service: Cardiovascular;  Laterality: Bilateral;   ABDOMINAL AORTOGRAM W/LOWER EXTREMITY Right 05/30/2021   Procedure: ABDOMINAL AORTOGRAM W/LOWER EXTREMITY;  Surgeon: Chuck Hint, MD;  Location: [facility] INVASIVE CV LAB;  Service: Cardiovascular;  Laterality: Right;   AORTA - BILATERAL FEMORAL ARTERY BYPASS GRAFT N/A 12/18/2014   Procedure: AORTOBIFEMORAL BYPASS  GRAFT;  Surgeon: Chuck Hint, MD;  Location: [facility] OR;  Service: Vascular;  Laterality: N/A;   CARDIAC CATHETERIZATION  2009   Nl Cors, EF 20%   COLONOSCOPY     ENDARTERECTOMY FEMORAL Left 12/18/2014   Procedure: ENDARTERECTOMY FEMORAL;  Surgeon: Chuck Hint, MD;  Location: [facility] OR;  Service: Vascular;  Laterality: Left;   FALSE ANEURYSM REPAIR Right 04/24/2019   Procedure: REPAIR OF RIGHT FEMORAL ARTERY PSEUDOANEURYSM;  Surgeon: Larina Earthly, MD;  Location: [facility] OR;  Service: Vascular;  Laterality: Right;   FEMORAL-POPLITEAL BYPASS GRAFT  02/21/2011   right   FEMORAL-TIBIAL BYPASS GRAFT Right 04/24/2019   Procedure: REDO BYPASS GRAFT RIGHT FEMORAL-TIBIAL ARTERY USING LEFT LEG VEIN & HEMASHIELD GOLD 8mm GRAFT;  Surgeon: Larina Earthly, MD;  Location: [facility] OR;  Service: Vascular;  Laterality: Right;   IR FLUORO GUIDE CV LINE RIGHT  12/28/2019   IR REMOVAL TUN CV CATH W/O FL  01/11/2020   IR US GUIDE VASC ACCESS RIGHT  12/28/2019   OTHER SURGICAL HISTORY     POST RIGHT FEMOROPOPLITEAL BYPASS GRAFT   PERIPHERAL VASCULAR CATHETERIZATION  11/05/2014   Procedure: LOWER EXTREMITY ANGIOGRAPHY;  Surgeon: Chuck Hint, MD;  Location: [facility] CATH LAB;  Service: Cardiovascular;;   VEIN HARVEST Left 04/24/2019   Procedure: LEFT LEG SAPHENOUS VEIN HARVEST;  Surgeon: Larina Earthly, MD;  Location: [facility] OR;  Service: Vascular;  Laterality: Left;    Assessment & Plan Clinical Impression: Kyle [name] is a 72 year old male with history of COPD- oxygen dependent, bilateral  lung nodules, CM w/systolic CHF, CKD, PAD, pulmonary HTN, A fib-on Eliquis who was admitted on 09/05/23 after falling on the floor while trying to get out of bed in morning. 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 He was noted to have dysarthric speech with right facial droop and nonfluent speech.  MRI brain done revealing unchanged acute left lentiform nucleus hemorrhage with mild edema and mild mass effect on left lateral ventricle. Neurology recommended SBP 130-150 range with repeat CT to monitor for stability. Eliquis reversed with Kcentra. 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 Cliffton Asters evaluated films and felt there no obvious dissection or intramural hematoma noted.    Dr. Robbins/VVS consulted and did not felt that pseudoaneurysm was new. Patient without pain, well perfused right foot with palpable DP pulse and recommended heparin drip when able and surgery within a month if patient able to be anticoagulated. SBP goal <140 per vascular. He was weaned off Cleviprex and Dr. Roda Shutters recommends resuming AC in 2 weeks if neuro and BP stable. He has had high oxygen needs up to 12 liters and was weaned down to  6 L yesterday.    Patient currently requires max with mobility secondary to muscle paralysis and abnormal tone, decreased coordination, and decreased motor planning.  Prior to hospitalization, patient was independent  with mobility and lived with Spouse in a Apartment home.  Home access is 15Stairs to enter.  Patient will benefit from skilled PT intervention to maximize safe functional mobility, minimize fall risk, and decrease caregiver burden for planned discharge home with 24 hour supervision.  Anticipate patient will benefit from follow up HH at discharge.  PT - End of Session Activity Tolerance: Tolerates 10 - 20 min activity with multiple rests Endurance Deficit: Yes PT  Assessment Rehab Potential (ACUTE/IP ONLY): Fair PT Barriers to Discharge: Inaccessible home environment PT Barriers to Discharge Comments: 15 STE apartment PT Patient demonstrates impairments in the following area(s): Balance;Safety;Endurance;Motor PT Transfers Functional Problem(s): Bed Mobility;Bed to Chair;Car;Furniture PT Locomotion Functional Problem(s): Ambulation;Wheelchair Mobility;Stairs PT Plan PT Intensity: Minimum of 1-2 x/day ,45 to 90 minutes PT Frequency: 5 out of 7 days PT Duration Estimated Length of Stay: 3-4 weeks. PT Treatment/Interventions: Ambulation/gait training;Community reintegration;Neuromuscular re-education;Stair training;UE/LE Strength taining/ROM;Wheelchair propulsion/positioning;Balance/vestibular training;Discharge planning;Therapeutic Activities;UE/LE Coordination activities;Patient/family education;Functional mobility training;Therapeutic Exercise PT Transfers Anticipated Outcome(s): CGA PT Locomotion Anticipated Outcome(s): CGA w/ LRAD PT Recommendation Follow Up Recommendations: Home health PT Patient destination: Home Equipment Recommended: To be determined Equipment Details: Pt has no equipment at home.   PT Evaluation Precautions/Restrictions Precautions Precautions: Fall Precaution Comments: R hemiplegia, slight "pusher" Restrictions Weight Bearing Restrictions: No General Chart Reviewed: Yes Family/Caregiver Present: No (spouse brought clothing and shoes but unable to stay.) Vital Signs  Pain Pain Assessment Pain Scale: 0-10 Pain Score: 0-No pain Pain Interference Pain Interference Pain Effect on Sleep: 0. Does not apply - I have not had any pain or hurting in the past 5 days Pain Interference with Therapy Activities: 0. Does not apply -  I have not received rehabilitationtherapy in the past 5 days Pain Interference with Day-to-Day Activities: 1. Rarely or not at all Home Living/Prior Functioning Home Living Available Help at  Discharge: Family;Available 24 hours/day Type of Home: Apartment Home Access: Stairs to enter Entrance Stairs-Number of Steps: 15 Entrance Stairs-Rails: Right;Left;Can reach both Bathroom Shower/Tub: Tub/shower unit  Lives With: Spouse Prior Function Level of Independence: Independent with transfers;Independent with gait  Able to Take Stairs?: Yes Vision/Perception     Cognition Overall Cognitive Status: Impaired/Different from baseline Arousal/Alertness: Awake/alert Orientation Level: Oriented to situation;Oriented to person;Oriented to place;Disoriented to time Year: Other (Comment) (2015) Month: November Day of Week: Incorrect Awareness: Appears intact Problem Solving: Appears intact Executive Function: Sequencing;Organizing Sequencing: Appears intact Organizing: Appears intact Safety/Judgment: Impaired Comments: impulsive. Sensation Sensation Light Touch: Appears Intact Coordination Gross Motor Movements are Fluid and Coordinated: No Fine Motor Movements are Fluid and Coordinated: No Heel Shin Test: unable RLE. Motor  Motor Motor: Hemiplegia Motor - Skilled Clinical Observations: dense R hemi   Trunk/Postural Assessment  Cervical Assessment Cervical Assessment: Within Functional Limits Thoracic Assessment Thoracic Assessment: Within Functional Limits Lumbar Assessment Lumbar Assessment: Exceptions to [facility] (posterior pelvic tilt.) Postural Control Postural Control: Deficits on evaluation Righting Reactions: delayed, list to Right.  Balance Balance Balance Assessed: Yes Static Sitting Balance Static Sitting - Balance Support: Feet supported;Left upper extremity supported Static Sitting - Level of Assistance: 4: Min assist Dynamic Sitting Balance Dynamic Sitting - Balance Support: Feet supported;Left upper extremity supported Dynamic Sitting - Level of Assistance: 3: Mod assist Sitting balance - Comments: moments of CGA from EOB with unilateral support from  LUE but noted to require up to MODA d/t pushing to R side and pushing posterior Extremity Assessment      RLE Assessment RLE Assessment: Exceptions to [facility] RLE Strength RLE Overall Strength: Deficits Right Hip Flexion: 0/5 Right Hip Extension: 1/5 Right Hip ABduction: 0/5 Right Hip ADduction: 0/5 Right Knee Flexion: 0/5 Right Knee Extension: 0/5 Right Ankle Dorsiflexion: 0/5 Right Ankle Plantar Flexion: 0/5 LLE Assessment LLE Assessment: Within Functional Limits General Strength Comments: grossly 5/5.  Care Tool Care Tool Bed Mobility Roll left and right activity   Roll left and right assist level: Maximal Assistance - Patient 25 - 49%    Sit to lying activity        Lying to sitting on side of bed activity   Lying to sitting on side of bed assist level: the ability to move from lying on the back to sitting on the side of the bed with no back support.: Maximal Assistance - Patient 25 - 49%     Care Tool Transfers Sit to stand transfer   Sit to stand assist level: Total Assistance - Patient < 25%    Chair/bed transfer   Chair/bed transfer assist level: Maximal Assistance - Patient 25 - 49%    Car transfer Car transfer activity did not occur: Safety/medical concerns        Care Tool Locomotion Ambulation Ambulation activity did not occur: Safety/medical concerns        Walk 10 feet activity Walk 10 feet activity did not occur: Safety/medical concerns       Walk 50 feet with 2 turns activity Walk 50 feet with 2 turns activity did not occur: Safety/medical concerns      Walk 150 feet activity Walk 150 feet activity did not occur: Safety/medical concerns      Walk 10 feet on uneven surfaces activity Walk 10 feet on  uneven surfaces activity did not occur: Safety/medical concerns      Stairs Stair activity did not occur: Safety/medical concerns        Walk up/down 1 step activity Walk up/down 1 step or curb (drop down) activity did not occur: Safety/medical  concerns      Walk up/down 4 steps activity Walk up/down 4 steps activity did not occur: Safety/medical concerns      Walk up/down 12 steps activity Walk up/down 12 steps activity did not occur: Safety/medical concerns      Pick up small objects from floor Pick up small object from the floor (from standing position) activity did not occur: Safety/medical concerns      Wheelchair Is the patient using a wheelchair?: Yes Type of Wheelchair: Manual   Wheelchair assist level: Dependent - Patient 0%    Wheel 50 feet with 2 turns activity   Assist Level: Dependent - Patient 0%  Wheel 150 feet activity   Assist Level: Dependent - Patient 0%    Refer to Care Plan for Long Term Goals  SHORT TERM GOAL WEEK 1 PT Short Term Goal 1 (Week 1): Pt will roll side to side w/ mod A. PT Short Term Goal 2 (Week 1): Pt will transfer sup to sit w/ mod A. PT Short Term Goal 3 (Week 1): Pt will perform squat pivot transfer w/ mod A.  Recommendations for other services: None   Skilled Therapeutic Intervention Evaluation completed (see details above and below) with education on PT POC and goals and individual treatment initiated with focus on NMR, transfers, balance, gait, strengthening.  Pt presents supine in bed and agreeable to therapy.  Pt w/ noted dysphagia but is understandable for most answers.  Pt required total A for threading pants over feet and past knees.  R LE placed in hooklying position and maintained and w/ BLES able to bridge and pull pants over hips w/ mod A on R side.  BP in supine at 129/60 w/ O2 at 92% on 5LPM.  Pt transfers sup to sit w/ max A and then required CGA to mod A to maintain.Seated BP at 158/68 w/o c/o.  T-shirt donned w/ mod to max A.  Pt performed squat pivot bed > w/c w/ max A and constant cueing 2/2 impulsiveness.  Pt BP at 163/87 MAP 109 immediately after transfer w/ O2 at 84%.  Nursing notified and present in room.  BP at 137/62 after 3-61min w/ O2 increased to 94%.  Pt c/o  w/c "too small" and requested transfer to recliner.  Pt performed squat pivot transfer w/ max A w/c > recliner.  Pt remained sitting in recliner, reclined back and LES elevated.  Chair alarm on and R UE elevated on pillows, all needs in reach.   Mobility Bed Mobility Bed Mobility: Rolling Right;Rolling Left;Supine to Sit Rolling Right: Maximal Assistance - Patient 25-49% Rolling Left: Total Assistance - Patient < 25% Supine to Sit: Maximal Assistance - Patient - Patient 25-49% Transfers Transfers: Lobbyist Pivot Transfers: Maximal Assistance - Patient 25-49% Transfer (Assistive device): None Locomotion  Gait Ambulation: No Gait Gait: No Stairs / Additional Locomotion Stairs: No Wheelchair Mobility Wheelchair Mobility: No   Discharge Criteria: Patient will be discharged from PT if patient refuses treatment 3 consecutive times without medical reason, if treatment goals not met, if there is a change in medical status, if patient makes no progress towards goals or if patient is discharged from hospital.  The above assessment, treatment plan,  treatment alternatives and goals were discussed and mutually agreed upon: by patient  Lucio Edward 09/11/2023, 12:34 PM

## 2023-09-11 NOTE — Evaluation (Signed)
Speech Language Pathology Assessment and Plan  Patient Details  Name: Kyle [name] MRN: [id number] Date of Birth: 12-Apr-1951  SLP Diagnosis: Dysarthria;Cognitive Impairments;Dysphagia  Rehab Potential: Good ELOS: 12-16 days    Today's Date: 09/11/2023 SLP Individual Time: 4132-4401 SLP Individual Time Calculation (min): 30 min   Hospital Problem: Principal Problem:   ICH (intracerebral hemorrhage) (HCC)  Past Medical History:  Past Medical History:  Diagnosis Date   Aortic insufficiency    MODERATE WITH A BICUSPID AORTIC VAVLE   Arterial occlusive disease    MULTILEVEL   Atrial fibrillation (HCC)    CHF (congestive heart failure) (HCC)    Chronic bronchitis (HCC)    CKD (chronic kidney disease) stage 3, GFR 30-59 ml/min (HCC)    COPD (chronic obstructive pulmonary disease) (HCC)    Dilated cardiomyopathy (HCC)    WITH EJECTION FRACTION DOWN TO 20-25%--WITH CONGESTIVE HEART FAILURE   Edema    LOWER EXTREMETIES   Emphysema of lung (HCC)    Hypertension    Insulin dependent type 2 diabetes mellitus (HCC)    Normal coronary arteries 2009   Orthopnea    Peripheral arterial disease (HCC)    Pulmonary hypertension (HCC)    Past Surgical History:  Past Surgical History:  Procedure Laterality Date   ABDOMINAL AORTAGRAM N/A 11/05/2014   Procedure: ABDOMINAL Ronny Flurry;  Surgeon: Chuck Hint, MD;  Location: [facility] CATH LAB;  Service: Cardiovascular;  Laterality: N/A;   ABDOMINAL AORTOGRAM W/LOWER EXTREMITY Bilateral 04/21/2019   Procedure: ABDOMINAL AORTOGRAM W/LOWER EXTREMITY;  Surgeon: Chuck Hint, MD;  Location: [facility] INVASIVE CV LAB;  Service: Cardiovascular;  Laterality: Bilateral;   ABDOMINAL AORTOGRAM W/LOWER EXTREMITY Right 05/30/2021   Procedure: ABDOMINAL AORTOGRAM W/LOWER EXTREMITY;  Surgeon: Chuck Hint, MD;  Location: [facility] INVASIVE CV LAB;  Service: Cardiovascular;  Laterality: Right;   AORTA - BILATERAL FEMORAL ARTERY BYPASS GRAFT N/A  12/18/2014   Procedure: AORTOBIFEMORAL BYPASS GRAFT;  Surgeon: Chuck Hint, MD;  Location: [facility] OR;  Service: Vascular;  Laterality: N/A;   CARDIAC CATHETERIZATION  2009   Nl Cors, EF 20%   COLONOSCOPY     ENDARTERECTOMY FEMORAL Left 12/18/2014   Procedure: ENDARTERECTOMY FEMORAL;  Surgeon: Chuck Hint, MD;  Location: [facility] OR;  Service: Vascular;  Laterality: Left;   FALSE ANEURYSM REPAIR Right 04/24/2019   Procedure: REPAIR OF RIGHT FEMORAL ARTERY PSEUDOANEURYSM;  Surgeon: Larina Earthly, MD;  Location: [facility] OR;  Service: Vascular;  Laterality: Right;   FEMORAL-POPLITEAL BYPASS GRAFT  02/21/2011   right   FEMORAL-TIBIAL BYPASS GRAFT Right 04/24/2019   Procedure: REDO BYPASS GRAFT RIGHT FEMORAL-TIBIAL ARTERY USING LEFT LEG VEIN & HEMASHIELD GOLD 8mm GRAFT;  Surgeon: Larina Earthly, MD;  Location: [facility] OR;  Service: Vascular;  Laterality: Right;   IR FLUORO GUIDE CV LINE RIGHT  12/28/2019   IR REMOVAL TUN CV CATH W/O FL  01/11/2020   IR US GUIDE VASC ACCESS RIGHT  12/28/2019   OTHER SURGICAL HISTORY     POST RIGHT FEMOROPOPLITEAL BYPASS GRAFT   PERIPHERAL VASCULAR CATHETERIZATION  11/05/2014   Procedure: LOWER EXTREMITY ANGIOGRAPHY;  Surgeon: Chuck Hint, MD;  Location: [facility] CATH LAB;  Service: Cardiovascular;;   VEIN HARVEST Left 04/24/2019   Procedure: LEFT LEG SAPHENOUS VEIN HARVEST;  Surgeon: Larina Earthly, MD;  Location: [facility] OR;  Service: Vascular;  Laterality: Left;    Assessment / Plan / Recommendation Clinical Impression HPI:   Clinical Impression:  Communication: Patient presents with [facility] expressive and receptive language.  Cognition: Patient with functional cognitive skills with the exception of orientation to time indicated by non-standardized assessment. Patient with sustained attention to task despite stomach pain, ability to recognize cognitive and physical deficits, and solve functional problems presented throughout the evaluation. Patient would benefit from further  evaluation regarding short term memory.  Dysarthria: Patient presents with moderate dysarthria characterized by imprecise articulation and poor respiratory support resulting in ~60% intelligibility at the word level. Questionable motor speech component, though recommend further evaluation.  Pt would benefit from skilled ST services to maximize cognition, dysarthria and orientation in order to maximize functional independence at d/c. Anticipate patient will require 24 hour supervision at d/c and f/u SLP services.    Skilled Therapeutic Interventions          Patient evaluated using a non-standardized cognitive linguistic assessment and bedside swallow evaluation to assess current cognitive, communicative and swallowing function. See above for details.    SLP Assessment  Patient will need skilled Speech Lanaguage Pathology Services during CIR admission    Recommendations  SLP Diet Recommendations: Dysphagia 1 (Puree);Nectar Liquid Administration via: Cup Medication Administration: Whole meds with puree Supervision: Staff to assist with self feeding;Full supervision/cueing for compensatory strategies Compensations: Slow rate;Small sips/bites;Multiple dry swallows after each bite/sip Postural Changes and/or Swallow Maneuvers: Seated upright 90 degrees Oral Care Recommendations: Oral care QID Patient destination: Home Follow up Recommendations: Outpatient SLP;24 hour supervision/assistance;Home Health SLP Equipment Recommended: None recommended by SLP    SLP Frequency 3 to 5 out of 7 days   SLP Duration  SLP Intensity  SLP Treatment/Interventions 12-16 days  Minumum of 1-2 x/day, 30 to 90 minutes  Cognitive remediation/compensation;Internal/external aids;Speech/Language facilitation;Cueing hierarchy;Therapeutic Activities;Functional tasks;Multimodal communication approach;Patient/family education;Therapeutic Exercise    Pain Patient with grimacing throughout the evaluation recalling  stomach pain. RN aware.    SLP Evaluation Cognition Overall Cognitive Status: Impaired/Different from baseline Arousal/Alertness: Awake/alert Orientation Level: Oriented to situation;Oriented to person;Oriented to place;Disoriented to time Year: Other (Comment) (2015) Month: November Day of Week: Incorrect Awareness: Appears intact Problem Solving: Appears intact Executive Function: Sequencing;Organizing Sequencing: Appears intact Organizing: Appears intact Safety/Judgment: Impaired  Comprehension Auditory Comprehension Overall Auditory Comprehension: Appears within functional limits for tasks assessed Yes/No Questions: Within Functional Limits Commands: Within Functional Limits Expression Expression Primary Mode of Expression: Verbal Verbal Expression Overall Verbal Expression: Appears within functional limits for tasks assessed Initiation: No impairment Repetition: No impairment Naming: No impairment Oral Motor Oral Motor/Sensory Function Overall Oral Motor/Sensory Function: Moderate impairment Facial ROM: Reduced right Facial Symmetry: Abnormal symmetry right Facial Strength: Reduced right Lingual ROM: Within Functional Limits Lingual Symmetry: Within Functional Limits Lingual Strength: Reduced Velum: Within Functional Limits Mandible: Within Functional Limits Motor Speech Overall Motor Speech: Impaired Respiration: Impaired Level of Impairment: Phrase Phonation: Hoarse Resonance: Within functional limits Articulation: Impaired Level of Impairment: Word Intelligibility: Intelligibility reduced Word: 50-74% accurate Phrase: 25-49% accurate Motor Planning: Not tested Effective Techniques: Slow rate;Increased vocal intensity;Over-articulate;Pacing  Care Tool Care Tool Cognition Ability to hear (with hearing aid or hearing appliances if normally used Ability to hear (with hearing aid or hearing appliances if normally used): 0. Adequate - no difficulty in normal  conservation, social interaction, listening to TV   Expression of Ideas and Wants Expression of Ideas and Wants: 3. Some difficulty - exhibits some difficulty with expressing needs and ideas (e.g, some words or finishing thoughts) or speech is not clear   Understanding Verbal and Non-Verbal Content Understanding Verbal and Non-Verbal Content: 3. Usually understands - understands most conversations, but  misses some part/intent of message. Requires cues at times to understand  Memory/Recall Ability Memory/Recall Ability : That he or she is in a hospital/hospital unit  Short Term Goals: Week 1: SLP Short Term Goal 1 (Week 1): Patient will utilize swallowing strategies during consumpution of D1/NTL diet to reduce s/sx of aspiration with mod multimodal A SLP Short Term Goal 2 (Week 1): Patient will participate in dysphagia treatment to demonstrate a functional change in oropharyngeal swallow given modA multimodal SLP Short Term Goal 3 (Week 1): Patient will increase speech intelligibility to 60% at the word level given mod multimodal A SLP Short Term Goal 4 (Week 1): Patient will demonstrate orientation to time given min multimodal A  Refer to Care Plan for Long Term Goals  Recommendations for other services: None   Discharge Criteria: Patient will be discharged from SLP if patient refuses treatment 3 consecutive times without medical reason, if treatment goals not met, if there is a change in medical status, if patient makes no progress towards goals or if patient is discharged from hospital.  The above assessment, treatment plan, treatment alternatives and goals were discussed and mutually agreed upon: by patient  Oluwademilade Mckiver M.A., CF-SLP 09/11/2023, 12:01 PM

## 2023-09-11 NOTE — Plan of Care (Signed)
  Problem: RH Cognition - SLP Goal: RH LTG Patient will demonstrate orientation with cues Description:  LTG:  Patient will demonstrate orientation to person/place/time/situation with cues (SLP)   Flowsheets (Taken 09/11/2023 1207) LTG Patient will demonstrate orientation to: Time LTG: Patient will demonstrate orientation using cueing (SLP): Supervision   Problem: RH Expression Communication Goal: LTG Patient will increase speech intelligibility (SLP) Description: LTG: Patient will increase speech intelligibility at word/phrase/conversation level with cues, % of the time (SLP) Flowsheets (Taken 09/11/2023 1207) LTG: Patient will increase speech intelligibility (SLP): Supervision Level:  Phrase  Word Percent of time patient will use intelligible speech: 70

## 2023-09-11 NOTE — Progress Notes (Addendum)
PROGRESS NOTE   Subjective/Complaints: Pt reports no issues last night. Remains on 5L oxygen. Slept well  ROS: Patient denies fever, rash, sore throat, blurred vision, dizziness, nausea, vomiting, diarrhea, cough, shortness of breath or chest pain, joint or back/neck pain, headache, or mood change.    Objective:   DG Chest 2 View  Result Date: 09/10/2023 CLINICAL DATA:  Shortness of breath EXAM: CHEST - 2 VIEW COMPARISON:  09/06/2023 FINDINGS: Cardiomegaly. Mild vascular congestion. No overt edema or effusions. No acute bony abnormality. IMPRESSION: Cardiomegaly, vascular congestion. Electronically Signed   By: Charlett Nose M.D.   On: 09/10/2023 20:06   Recent Labs    09/10/23 0431 09/11/23 0446  WBC 8.0 7.9  HGB 12.4* 12.1*  HCT 38.7* 37.9*  PLT 153 149*   Recent Labs    09/10/23 0431 09/11/23 0446  NA 141 143  K 4.0 4.0  CL 109 108  CO2 23 24  GLUCOSE 124* 129*  BUN 29* 33*  CREATININE 1.96* 1.81*  CALCIUM 8.7* 8.8*    Intake/Output Summary (Last 24 hours) at 09/11/2023 0802 Last data filed at 09/11/2023 0400 Gross per 24 hour  Intake 120 ml  Output 825 ml  Net -705 ml     Pressure Injury 09/10/23 Buttocks Right Stage 2 -  Partial thickness loss of dermis presenting as a shallow open injury with a red, pink wound bed without slough. (Active)  09/10/23 1742  Location: Buttocks  Location Orientation: Right  Staging: Stage 2 -  Partial thickness loss of dermis presenting as a shallow open injury with a red, pink wound bed without slough.  Wound Description (Comments):   Present on Admission: Yes    Physical Exam: Vital Signs Blood pressure (!) 130/56, pulse 71, temperature 98.3 F (36.8 C), temperature source Oral, resp. rate 18, height 5\' 11"  (1.803 m), weight 73.4 kg, SpO2 94%.   General: Alert and oriented x 3, No apparent distress HEENT: Head is normocephalic, atraumatic, PERRLA, EOMI, sclera  anicteric, oral mucosa pink and moist, dentition intact, ext ear canals clear,  Neck: Supple without JVD or lymphadenopathy Heart: Reg rate and rhythm. No murmurs rubs or gallops Chest: CTA bilaterally without wheezes, rales, or rhonchi; no distress, O2 [place] Abdomen: Soft, non-tender, non-distended, bowel sounds positive. Extremities: No clubbing, cyanosis, or edema. Pulses are 2+ Psych: Pt's affect is appropriate. Pt is cooperative Skin: Clean and intact without signs of breakdown Neuro:  pt is alert and oriented. Follows basic commands. Speech very dysarthric. Right central 7. RUE 0/5. RLE ? Trace HE but 0/5 otherwise. ?early extensor tone. Decreased LT RUE>RLE. Still dysesthetic with ROM RUE. Toes up Musculoskeletal: Right arm tender with movement. No swelling   Assessment/Plan: 1. Functional deficits which require 3+ hours per day of interdisciplinary therapy in a comprehensive inpatient rehab setting. Physiatrist is providing close team supervision and 24 hour management of active medical problems listed below. Physiatrist and rehab team continue to assess barriers to discharge/monitor patient progress toward functional and medical goals  Care Tool:  Bathing              Bathing assist       Upper Body Dressing/Undressing Upper body  dressing        Upper body assist      Lower Body Dressing/Undressing Lower body dressing            Lower body assist       Toileting Toileting    Toileting assist       Transfers Chair/bed transfer  Transfers assist           Locomotion Ambulation   Ambulation assist              Walk 10 feet activity   Assist           Walk 50 feet activity   Assist           Walk 150 feet activity   Assist           Walk 10 feet on uneven surface  activity   Assist           Wheelchair     Assist               Wheelchair 50 feet with 2 turns activity    Assist             Wheelchair 150 feet activity     Assist          Blood pressure (!) 130/56, pulse 71, temperature 98.3 F (36.8 C), temperature source Oral, resp. rate 18, height 5\' 11"  (1.803 m), weight 73.4 kg, SpO2 94%.  Medical Problem List and Plan: 1. Functional deficits secondary to left basal ganglia ICH d/t hypertension/eliquis             -patient may shower             -ELOS/Goals: 12-16+ days, goals min assist with PT, OT, SLP             - resting WHO and PRAFO for right side pending  -Patient is beginning CIR therapies today including PT, OT, and SLP  2.  Antithrombotics: -DVT/anticoagulation:  Pharmaceutical: Lovenox added per chat w/Dr. Roda Shutters             -antiplatelet therapy: N/A due to bleed 3. Pain Management: Tylenol prn.             -pt with dysesthesias RUE, consider gabapentin trial  -11/16 wasn't complaining of arm today--obsv today 4. Mood/Behavior/Sleep: LCSW to follow for evaluation and support.              -antipsychotic agents: N/A 5. Neuropsych/cognition: This patient is capable of making decisions on his own behalf. 6. Skin/Wound Care: Routine pressure relief measures. 7. Fluids/Electrolytes/Nutrition: Monitor I/O. Check CMET in am             --added low salt restrictions. Add protein supplement for    -push po   8. T2DM insulin dependent: Hgb A1c-6.2 on 09/06/23             -diet controlled   9. PAD s/o aortobifemoral bypass w/right pseudoaneurysm: SBP goal < 140.  --Plans for repair early December by Dr. Sherral Hammers? 10. COPD w/chronic bronchitis/emphysema: currently on 6 L oxygen. Titrate to keep sats > 90% per Dr. Vassie Loll.              --on Trelegy PTA --started on Breo/Incruse today.              --pulmonary toilet. Wean down to baseline as able.  11. CAF: Monitor HR TID. On coreg. Off eliquis due to ICH             --  Neurology recommends resuming  Eliquis in 2 weeks if neuro and BP stable.      12. Dilated cardiomyopathy/AS/chronic systolic CHF: Low salt  diet. Check weight daily--up from 166-->173?             --CXR 11/11 w/vascular congestion-->will order follow up CXR to check for fluid overload as cause of hypoxia.  --Monitor for signs of overload. On Lasix, coreg, Imdur, hydralazine  -LVEF 55-60%   Filed Weights   09/10/23 1514 09/11/23 0435  Weight: 71.8 kg 73.4 kg   11/16 weight up today--monitor for pattern 13. CKD: stage IIIb?             -11/16  BUN up to 31,              -push fluids. May need overnight IVF  -recheck bmet tomorrow             -avoid nephrotoxic meds   14. Dysphagia: On D1, nectar              -encourage fluids, aspiration precautions   -see above  LOS: 1 days A FACE TO FACE EVALUATION WAS PERFORMED  Ranelle Oyster 09/11/2023, 8:02 AM

## 2023-09-11 NOTE — Evaluation (Signed)
Occupational Therapy Assessment and Plan  Patient Details  Name: Kyle [name] MRN: [id number] Date of Birth: 02-24-1951  OT Diagnosis: abnormal posture, flaccid hemiplegia and hemiparesis, and hemiplegia affecting dominant side Rehab Potential: Rehab Potential (ACUTE ONLY): Good ELOS: 3-4 wks   Today's Date: 09/11/2023 OT Individual Time:  -        Hospital Problem: Principal Problem:   ICH (intracerebral hemorrhage) (HCC)   Past Medical History:  Past Medical History:  Diagnosis Date   Aortic insufficiency    MODERATE WITH A BICUSPID AORTIC VAVLE   Arterial occlusive disease    MULTILEVEL   Atrial fibrillation (HCC)    CHF (congestive heart failure) (HCC)    Chronic bronchitis (HCC)    CKD (chronic kidney disease) stage 3, GFR 30-59 ml/min (HCC)    COPD (chronic obstructive pulmonary disease) (HCC)    Dilated cardiomyopathy (HCC)    WITH EJECTION FRACTION DOWN TO 20-25%--WITH CONGESTIVE HEART FAILURE   Edema    LOWER EXTREMETIES   Emphysema of lung (HCC)    Hypertension    Insulin dependent type 2 diabetes mellitus (HCC)    Normal coronary arteries 2009   Orthopnea    Peripheral arterial disease (HCC)    Pulmonary hypertension (HCC)    Past Surgical History:  Past Surgical History:  Procedure Laterality Date   ABDOMINAL AORTAGRAM N/A 11/05/2014   Procedure: ABDOMINAL Ronny Flurry;  Surgeon: Chuck Hint, MD;  Location[facility] CATH LAB;  Service: Cardiovascular;  Laterality: N/A;   ABDOMINAL AORTOGRAM W/LOWER EXTREMITY Bilateral 04/21/2019   Procedure: ABDOMINAL AORTOGRAM W/LOWER EXTREMITY;  Surgeon: Chuck Hint, MD;  Location: [facility] INVASIVE CV LAB;  Service: Cardiovascular;  Laterality: Bilateral;   ABDOMINAL AORTOGRAM W/LOWER EXTREMITY Right 05/30/2021   Procedure: ABDOMINAL AORTOGRAM W/LOWER EXTREMITY;  Surgeon: Chuck Hint, MD;  Location: [facility] INVASIVE CV LAB;  Service: Cardiovascular;  Laterality: Right;   AORTA - BILATERAL FEMORAL ARTERY  BYPASS GRAFT N/A 12/18/2014   Procedure: AORTOBIFEMORAL BYPASS GRAFT;  Surgeon: Chuck Hint, MD;  Location: [facility] OR;  Service: Vascular;  Laterality: N/A;   CARDIAC CATHETERIZATION  2009   Nl Cors, EF 20%   COLONOSCOPY     ENDARTERECTOMY FEMORAL Left 12/18/2014   Procedure: ENDARTERECTOMY FEMORAL;  Surgeon: Chuck Hint, MD;  Location: [facility] OR;  Service: Vascular;  Laterality: Left;   FALSE ANEURYSM REPAIR Right 04/24/2019   Procedure: REPAIR OF RIGHT FEMORAL ARTERY PSEUDOANEURYSM;  Surgeon: Larina Earthly, MD;  Location: [facility] OR;  Service: Vascular;  Laterality: Right;   FEMORAL-POPLITEAL BYPASS GRAFT  02/21/2011   right   FEMORAL-TIBIAL BYPASS GRAFT Right 04/24/2019   Procedure: REDO BYPASS GRAFT RIGHT FEMORAL-TIBIAL ARTERY USING LEFT LEG VEIN & HEMASHIELD GOLD 8mm GRAFT;  Surgeon: Larina Earthly, MD;  Location: [facility] OR;  Service: Vascular;  Laterality: Right;   IR FLUORO GUIDE CV LINE RIGHT  12/28/2019   IR REMOVAL TUN CV CATH W/O FL  01/11/2020   IR US GUIDE VASC ACCESS RIGHT  12/28/2019   OTHER SURGICAL HISTORY     POST RIGHT FEMOROPOPLITEAL BYPASS GRAFT   PERIPHERAL VASCULAR CATHETERIZATION  11/05/2014   Procedure: LOWER EXTREMITY ANGIOGRAPHY;  Surgeon: Chuck Hint, MD;  Location: [facility] CATH LAB;  Service: Cardiovascular;;   VEIN HARVEST Left 04/24/2019   Procedure: LEFT LEG SAPHENOUS VEIN HARVEST;  Surgeon: Larina Earthly, MD;  Location: [facility] OR;  Service: Vascular;  Laterality: Left;    Assessment & Plan Clinical Impression: [name] is a 72 year  old male with history of COPD- oxygen dependent, bilateral lung nodules, CM w/systolic CHF, CKD, PAD, pulmonary HTN, A fib-on Eliquis who was admitted on 09/05/23 after falling on the floor while trying to get out of bed in morning. 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 He was noted to have  dysarthric speech with right facial droop and nonfluent speech. MRI brain done revealing unchanged acute left lentiform nucleus hemorrhage with mild edema and mild mass effect on left lateral ventricle. Neurology recommended SBP 130-150 range with repeat CT to monitor for stability. Eliquis reversed with Kcentra. 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 Cliffton Asters evaluated films and felt there no obvious dissection or intramural hematoma noted.    Dr. Robbins/VVS consulted and did not felt that pseudoaneurysm was new. Patient without pain, well perfused right foot with palpable DP pulse and recommended heparin drip when able and surgery within a month if patient able to be anticoagulated. SBP goal <140 per vascular. He was weaned off Cleviprex and Dr. Roda Shutters recommends resuming AC in 2 weeks if neuro and BP stable. He has had high oxygen needs up to 12 liters and was weaned down to  6 L yesterday.     Patient currently requires max with mobility secondary to muscle paralysis and abnormal tone, decreased coordination, and decreased motor planning.  Prior to hospitalization, patient was independent  with mobility and lived with Spouse in a Apartment home.  Home access is 15Stairs to enter.  Patient transferred to CIR on 09/10/2023 .    Patient currently requires min to MaxA with basic self-care skills secondary to muscle weakness and ataxia, decreased coordination, and decreased motor planning.  Prior to hospitalization, patient could complete BADL's with independent to Mod I.  Patient will benefit  from skilled intervention to increase independence with basic self-care skills prior to discharge home with care partner.  Anticipate patient will require 24 hour supervision and follow up home health.  OT - End of Session Activity Tolerance: Tolerates 30+ min activity with multiple rests;Tolerates 30+ min activity without fatigue Endurance Deficit: Yes OT Assessment Rehab Potential (ACUTE ONLY): Good OT Patient demonstrates impairments in the following area(s): Balance;Endurance;Safety OT Basic ADL's Functional Problem(s): Bathing;Dressing;Toileting;Eating;Grooming OT Transfers Functional Problem(s): Toilet;Tub/Shower OT Plan OT Frequency: 5 out of 7 days;Total of 15 hours over 7 days of combined therapies OT Duration/Estimated Length of Stay: 3-4 wks OT Treatment/Interventions: Balance/vestibular training;Neuromuscular re-education;Self Care/advanced ADL retraining;Therapeutic Exercise;UE/LE Coordination activities;DME/adaptive equipment instruction;UE/LE Strength taining/ROM;Therapeutic Activities OT Self Feeding Anticipated Outcome(s): ModI OT Basic Self-Care Anticipated Outcome(s): ModI OT Toileting Anticipated Outcome(s): ModI OT Bathroom Transfers Anticipated Outcome(s): ModI OT Recommendation Patient destination: Home Follow Up Recommendations: Home health OT (for transitional needs) Equipment Recommended: 3 in 1 bedside comode Equipment Details: TBD   OT Evaluation Precautions/Restrictions  Precautions Precautions: Fall Precaution Comments: R hemiplegia, slight "pusher" Restrictions Weight Bearing Restrictions: No General Chart Reviewed: Yes Family/Caregiver Present: No Vital Signs Therapy Vitals Temp: 97.9 F (36.6 C) Temp Source: Oral Pulse Rate: 70  Resp: 17 BP: (!) 142/59 Patient Position (if appropriate): Lying Oxygen Therapy SpO2: 94 % O2 Device: Nasal Cannula (3L) O2 Flow Rate (L/min): 5 L/min Patient Activity (if Appropriate): In bed Pulse Oximetry  Type: Continuous Pain Pain Assessment Pain Scale: 0-10 Home Living/Prior Functioning Home Living Family/patient expects to be discharged to:: Private residence Living Arrangements: Spouse/significant other Available Help at Discharge: Family, Available 24 hours/day Type of Home: Apartment Home Access: Stairs to enter [employer] of Steps: 15 Entrance Stairs-Rails: Right, Left, Can reach both Home Layout: One level Bathroom Shower/Tub: Walk-in shower (patient indicated that he had a walk in shower, chart indicate combined tub/shower) Bathroom Toilet: Standard Bathroom Accessibility: Yes  Lives With: Spouse Prior Function Level of Independence: Independent with transfers, Independent with gait  Able to Take Stairs?: Yes Vision Baseline Vision/History: 0 No visual deficits Patient Visual Report: No change from baseline Perception  Perception: Impaired Praxis   Cognition Cognition Overall Cognitive Status: Impaired/Different from baseline (able to follow simple commands, most response were appropriate) Arousal/Alertness: Awake/alert Orientation Level: Person;Place;Situation Person: Oriented Place: Oriented Situation: Oriented Memory: Impaired (short- term) Awareness: Appears intact Problem Solving: Appears intact Executive Function: Sequencing;Organizing Sequencing: Appears intact Organizing: Appears intact Safety/Judgment: Impaired Comments: impulsive. Brief Interview for Mental Status (BIMS) Repetition of Three Words (First Attempt): 2 Temporal Orientation: Year: Correct Temporal Orientation: Month: Accurate within 5 days Temporal Orientation: Day: Correct Recall: "Sock": Yes, after cueing ("something to wear") Recall: "Blue": Yes, no cue required Recall: "Bed": No, could not recall BIMS Summary Score: 11 Sensation Sensation Light Touch: Appears Intact (light touch with vision occluded) Coordination Gross Motor Movements are Fluid and Coordinated: No  (secondary to hemiparetic R dominate UE) Motor  Motor Motor: Hemiplegia Motor - Skilled Clinical Observations: dense R hemi  Trunk/Postural Assessment  Cervical Assessment Cervical Assessment: Within Functional Limits Thoracic Assessment Thoracic Assessment: Within Functional Limits Postural Control Postural Control: Deficits on evaluation Righting Reactions: delayed, list to Right. Postural Limitations: preference for heavy R lean  Balance Static Sitting Balance Static Sitting - Balance Support: Feet supported Static Sitting - Level of Assistance: 2: Max assist (over time, very inconsistent) Dynamic Sitting Balance Dynamic Sitting - Level of Assistance: 3: Mod assist;2: Max assist Dynamic Sitting Balance - Compensations: L non dominant side Sitting balance - Comments: CGA to MaxA for maintian good anatomical positioning Extremity/Trunk Assessment RUE Assessment Passive Range of Motion (PROM) Comments: 90 degrees of shld flexion without pain response Active Range of Motion (AROM) Comments: 70-80 degrees General Strength Comments: flaccid LUE Assessment Passive Range of Motion (PROM) Comments: WFL Active Range of Motion (AROM) Comments: WFL General Strength Comments: 4-/5MMT  Care Tool Care Tool Self Care Eating   Eating Assist Level: Minimal Assistance - Patient > 75%    Oral Care    Oral Care Assist Level: Moderate Assistance - Patient 50 - 74%    Bathing   Body parts bathed by patient: Right arm;Chest;Abdomen;Front perineal area;Right upper leg;Left upper leg;Face Body parts bathed by helper: Right lower leg;Left lower leg;Buttocks;Front perineal area;Left arm   Assist Level: Maximal Assistance - Patient 24 - 49%    Upper Body Dressing(including orthotics)   What is the patient wearing?: Pull over shirt   Assist Level: Minimal Assistance - Patient > 75%    Lower Body Dressing (excluding footwear)   What is the patient wearing?: Pants;Incontinence brief;Hospital  gown only Assist for lower body dressing: Maximal Assistance - Patient 25 - 49%    Putting on/Taking off footwear   What  is the patient wearing?: Non-skid slipper socks Assist for footwear: Maximal Assistance - Patient 25 - 49%       Care Tool Toileting Toileting activity   Assist for toileting: Maximal Assistance - Patient 25 - 49%     Care Tool Bed Mobility Roll left and right activity   Roll left and right assist level: Maximal Assistance - Patient 25 - 49%    Sit to lying activity   Sit to lying assist level: Maximal Assistance - Patient 25 - 49%    Lying to sitting on side of bed activity   Lying to sitting on side of bed assist level: the ability to move from lying on the back to sitting on the side of the bed with no back support.: Maximal Assistance - Patient 25 - 49%     Care Tool Transfers Sit to stand transfer   Sit to stand assist level: Dependent - Patient 0%    Chair/bed transfer   Chair/bed transfer assist level: Maximal Assistance - Patient 25 - 49%     Toilet transfer   Assist Level: Maximal Assistance - Patient 24 - 49%     Care Tool Cognition  Expression of Ideas and Wants Expression of Ideas and Wants: 3. Some difficulty - exhibits some difficulty with expressing needs and ideas (e.g, some words or finishing thoughts) or speech is not clear  Understanding Verbal and Non-Verbal Content Understanding Verbal and Non-Verbal Content: 3. Usually understands - understands most conversations, but misses some part/intent of message. Requires cues at times to understand   Memory/Recall Ability Memory/Recall Ability : That he or she is in a hospital/hospital unit   Refer to Care Plan for Long Term Goals  SHORT TERM GOAL WEEK 1 OT Short Term Goal 1 (Week 1): The pt will complete UB bathing and dressing with MinA consistently at 95% safe OT Short Term Goal 2 (Week 1): The pt will complete LB bathing a dressing using AE at Mod A at 95% safe OT Short Term Goal 3  (Week 1): The pt will transfer to all surfaces with Mod A using the sliding board at 95% safe OT Short Term Goal 4 (Week 1): The pt will maintain good sit balance  consistently at 95% safe OT Short Term Goal 5 (Week 1): The pt will tolerate > 45 Minutes of therapy with minimal restbreaks at 95% safe.  Recommendations for other services: None    Skilled Therapeutic Intervention  Patient seen this day for skilled OT evaluation. Patient in good spirit with no c/o pain. Patient presents  with hemiparetic RUE impacting how he approaches his environment. The pt is reported as R hand dominant prior to most recent episode. Patient also presents with a heavy R lean impacting his sitting balance, both static and dynamic during functional task performance. Patient requires frequent  breaks secondary to generalize weakness,challenges with activity tolerance, and change in tone secondary to decrease neuromuscular activity associate with his RU, which is his dominate extremity. The pt was assisted at EOB while completing bathing exercise.  The pt transferred from supine in bed to EOB with  MaxA. The pt was  Min to [facility] with UB  task performance and MaxA to Dep with LB function in bathing and dressing. The pt required MaxAx2 using the stedy for all functional t/f's to all surfaces.  The pt required both physical assistance and vc's for self correcting in relation to maintaining good anatomical positioning  while seated unsupported EOB frequently incorporating the  the bed rails for additional balance. In my opinion, the pt would benefit from skilled OT to improve his functional outcome and reduce the burden of care for the care provider.  The pt presents with a desire to do the necessary work to improve his outcome. I recommend that the pt be seen 3 to 4 weeks with the above mentioned short and long-term objective in place with modification as needed.  ADL ADL Equipment Provided:  (Stedyx2) Eating: Minimal  assistance Where Assessed-Eating: Bed level Grooming: Minimal assistance Where Assessed-Grooming: Edge of bed Upper Body Bathing: Moderate assistance (secondary to challenges with functional sit balance and hemiparesis associate with dominate RUE) Where Assessed-Upper Body Bathing: Edge of bed Lower Body Bathing: Maximal assistance Where Assessed-Lower Body Bathing: Edge of bed Upper Body Dressing: Moderate assistance Where Assessed-Upper Body Dressing: Edge of bed Lower Body Dressing: Maximal assistance Where Assessed-Lower Body Dressing: Edge of bed Toileting: Dependent Where Assessed-Toileting: Bed level (brief) Toilet Transfer: Maximal assistance (using stedy x2 based on observation) Toilet Transfer Method:  Antony Salmon x2 based on observation) Tub/Shower Transfer: Maximal assistance (Stedy x 2) Tub/Shower Transfer Method:  Antony Salmon) Tub/Shower Equipment: Marine scientist with wheels based on observation) Film/video editor: Maximal assistance (Using the stedy) Film/video editor Method:  Antony Salmon) Astronomer: Information systems manager with back ADL Comments: Patient inconsistent with unsupported sitting Mobility  Bed Mobility Bed Mobility: Rolling Right;Rolling Left;Supine to Sit Rolling Right: Maximal Assistance - Patient 25-49% Supine to Sit: Maximal Assistance - Patient - Patient 25-49% (inconsistent with preference for heavy R Lean)   Discharge Criteria: Patient will be discharged from OT if patient refuses treatment 3 consecutive times without medical reason, if treatment goals not met, if there is a change in medical status, if patient makes no progress towards goals or if patient is discharged from hospital.  The above assessment, treatment plan, treatment alternatives and goals were discussed and mutually agreed upon: by patient  Lavona Mound 09/11/2023, 5:29 PM

## 2023-09-11 NOTE — Plan of Care (Signed)
  Problem: RH Swallowing Goal: LTG Patient will consume least restrictive diet using compensatory strategies with assistance (SLP) Description: LTG:  Patient will consume least restrictive diet using compensatory strategies with assistance (SLP) Flowsheets (Taken 09/11/2023 1122) LTG: Pt Patient will consume least restrictive diet using compensatory strategies with assistance of (SLP): Supervision Goal: LTG Pt will demonstrate functional change in swallow as evidenced by bedside/clinical objective assessment (SLP) Description: LTG: Patient will demonstrate functional change in swallow as evidenced by bedside/clinical objective assessment (SLP) Flowsheets (Taken 09/11/2023 1122) LTG: Patient will demonstrate functional change in swallow as evidenced by bedside/clinical objective assessment: Oropharyngeal swallow

## 2023-09-11 NOTE — Evaluation (Signed)
Bedside Swallow Evaluation   Patient Details  Name: Kyle [name] MRN: [id number] Date of Birth: [birth year]  SLP Diagnosis: Dysphagia  Rehab Potential: Good ELOS: 12-16 days    Today's Date: 09/11/2023 SLP Individual Time: 4782-9562 SLP Individual Time Calculation (min): 30 min   Hospital Problem: Principal Problem:   ICH (intracerebral hemorrhage) (HCC)  Past Medical History:  Past Medical History:  Diagnosis Date   Aortic insufficiency    MODERATE WITH A BICUSPID AORTIC VAVLE   Arterial occlusive disease    MULTILEVEL   Atrial fibrillation (HCC)    CHF (congestive heart failure) (HCC)    Chronic bronchitis (HCC)    CKD (chronic kidney disease) stage 3, GFR 30-59 ml/min (HCC)    COPD (chronic obstructive pulmonary disease) (HCC)    Dilated cardiomyopathy (HCC)    WITH EJECTION FRACTION DOWN TO 20-25%--WITH CONGESTIVE HEART FAILURE   Edema    LOWER EXTREMETIES   Emphysema of lung (HCC)    Hypertension    Insulin dependent type 2 diabetes mellitus (HCC)    Normal coronary arteries 2009   Orthopnea    Peripheral arterial disease (HCC)    Pulmonary hypertension (HCC)    Past Surgical History:  Past Surgical History:  Procedure Laterality Date   ABDOMINAL AORTAGRAM N/A 11/05/2014   Procedure: ABDOMINAL Ronny Flurry;  Surgeon: Chuck Hint, MD;  Location: [facility] CATH LAB;  Service: Cardiovascular;  Laterality: N/A;   ABDOMINAL AORTOGRAM W/LOWER EXTREMITY Bilateral 04/21/2019   Procedure: ABDOMINAL AORTOGRAM W/LOWER EXTREMITY;  Surgeon: Chuck Hint, MD;  Location: [facility] INVASIVE CV LAB;  Service: Cardiovascular;  Laterality: Bilateral;   ABDOMINAL AORTOGRAM W/LOWER EXTREMITY Right 05/30/2021   Procedure: ABDOMINAL AORTOGRAM W/LOWER EXTREMITY;  Surgeon: Chuck Hint, MD;  Location: [facility] INVASIVE CV LAB;  Service: Cardiovascular;  Laterality: Right;   AORTA - BILATERAL FEMORAL ARTERY BYPASS GRAFT N/A 12/18/2014   Procedure: AORTOBIFEMORAL BYPASS GRAFT;   Surgeon: Chuck Hint, MD;  Location: [facility] OR;  Service: Vascular;  Laterality: N/A;   CARDIAC CATHETERIZATION  2009   Nl Cors, EF 20%   COLONOSCOPY     ENDARTERECTOMY FEMORAL Left 12/18/2014   Procedure: ENDARTERECTOMY FEMORAL;  Surgeon: Chuck Hint, MD;  Location: [facility] OR;  Service: Vascular;  Laterality: Left;   FALSE ANEURYSM REPAIR Right 04/24/2019   Procedure: REPAIR OF RIGHT FEMORAL ARTERY PSEUDOANEURYSM;  Surgeon: Larina Earthly, MD;  Location: [facility] OR;  Service: Vascular;  Laterality: Right;   FEMORAL-POPLITEAL BYPASS GRAFT  02/21/2011   right   FEMORAL-TIBIAL BYPASS GRAFT Right 04/24/2019   Procedure: REDO BYPASS GRAFT RIGHT FEMORAL-TIBIAL ARTERY USING LEFT LEG VEIN & HEMASHIELD GOLD 8mm GRAFT;  Surgeon: Larina Earthly, MD;  Location: [facility] OR;  Service: Vascular;  Laterality: Right;   IR FLUORO GUIDE CV LINE RIGHT  12/28/2019   IR REMOVAL TUN CV CATH W/O FL  01/11/2020   IR US GUIDE VASC ACCESS RIGHT  12/28/2019   OTHER SURGICAL HISTORY     POST RIGHT FEMOROPOPLITEAL BYPASS GRAFT   PERIPHERAL VASCULAR CATHETERIZATION  11/05/2014   Procedure: LOWER EXTREMITY ANGIOGRAPHY;  Surgeon: Chuck Hint, MD;  Location: [facility] CATH LAB;  Service: Cardiovascular;;   VEIN HARVEST Left 04/24/2019   Procedure: LEFT LEG SAPHENOUS VEIN HARVEST;  Surgeon: Larina Earthly, MD;  Location: [facility] OR;  Service: Vascular;  Laterality: Left;    Assessment / Plan / Recommendation Clinical Impression HPI:  72 year old male with history of dilated cardiomyopathy, COPD on home O2 at 4.5 liters  at baseline, PAD, seizures, Afib  on Eliquis, fem pop bypass 2010, aortobifemoral bypass in 2016  who presented on 09/06/23  to [facility] with right arm weakness and difficulty speaking.    CT showed left thalamic hemorrhage. Given Kcentra for ELiquis reversal. CTA head and neck No CTA spot sign or vascular malformation at the left lentiform hemorrhage. 2 d echo EF 55 to 60 %. Now on no anticoagulation.  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 VVS felt chronic and plan to repair in 1 month as an outpatient. CTA Chest/Abd/Pel- Extensive aortic atherosclerosis with multiple penetrating ulcers and ulcerated plaques. 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 Chronic respiratory failure on 4.5 liters at home. CXR with vascular congestion. On lasix daily.    Clinical Impression:  Bedside Swallow Evaluation: A bedside swallow evaluation was utilized to assess for s/sx of oropharyngeal dysphagia. Patient extremely irritable and uncomfortable upon SLP entrance. SLP attempted to re-position patient, though ineffective. Patient allowed for SLP to complete extensive oral care due to thick, white secretions present throughout oral cavity. Patient with R sided weakness noted throughout oral mechanism exam. Patient offered PO trials of puree and NTL. Patient with no overt s/sx of aspiration, however R anterior spillage during NTL trials and mild diffuse oral residuals. Recommend continuation of current diet (D1/NTL) per FEES results and oral weakness. Medications administered whole in puree. Recommend use of standardized swallowing precautions (small bites/sips, slow rate, upright at 90 degrees) along with lingual/digital sweep for R buccal residuals and full supervision.    Skilled Therapeutic Interventions          Patient evaluated using a non-standardized cognitive linguistic assessment and bedside swallow evaluation to assess current cognitive, communicative and swallowing function. See above for details.    SLP Assessment  Patient will need skilled Speech Lanaguage Pathology Services during CIR admission    Recommendations  SLP Diet Recommendations: Dysphagia 1 (Puree);Nectar Liquid Administration via: Cup Medication Administration: Whole meds with  puree Supervision: Staff to assist with self feeding;Full supervision/cueing for compensatory strategies Compensations: Slow rate;Small sips/bites;Multiple dry swallows after each bite/sip Postural Changes and/or Swallow Maneuvers: Seated upright 90 degrees Oral Care Recommendations: Oral care QID Patient destination: Home Follow up Recommendations: Outpatient SLP;24 hour supervision/assistance;Home Health SLP Equipment Recommended: None recommended by SLP    SLP Frequency 3 to 5 out of 7 days   SLP Duration  SLP Intensity  SLP Treatment/Interventions 12-16 days  Minumum of 1-2 x/day, 30 to 90 minutes  Dysphagia/aspiration precaution training;Internal/external aids;Therapeutic Activities;Patient/family education;Therapeutic Exercise;Cueing hierarchy    Pain Patient very uncomfortable and distracted throughout the session stating he "cannot get comfortable." SLP attempted to re-adjust pillows.   Care Tool Care Tool Cognition Ability to hear (with hearing aid or hearing appliances if normally used Ability to hear (with hearing aid or hearing appliances if normally used): 0. Adequate - no difficulty in normal conservation, social interaction, listening to TV   Expression of Ideas and Wants Expression of Ideas and Wants: 3. Some difficulty - exhibits some difficulty with expressing needs and ideas (e.g, some words or finishing thoughts) or speech is not clear   Understanding Verbal and Non-Verbal Content Understanding Verbal and Non-Verbal Content: 3. Usually understands - understands most conversations, but misses some part/intent of message. Requires cues at times to understand  Memory/Recall Ability Memory/Recall Ability : That he or she is in a  hospital/hospital unit   Bedside Swallowing Assessment General Diet Prior to this Study: Dysphagia 1 (pureed);Mildly thick liquids (Level 2, nectar thick) Respiratory Status: Supplemental O2 delivered via (comment) Behavior/Cognition:  Distractible;Fusing/Irritable Oral Cavity - Dentition: Dentures, top;Dentures, bottom Self-Feeding Abilities: Needs assist Patient Positioning: Upright in chair/Tumbleform Baseline Vocal Quality: Normal Volitional Cough: Strong Volitional Swallow: Able to elicit  Oral Care Assessment Oral Assessment  (WDL): Exceptions to WDL Lips: Asymmetrical Teeth: Dentures upper;Dentures lower Tongue: Coated white;Moist Level of Consciousness: Alert Is patient on any of following O2 devices?: None of the above Nutritional status: Dysphagia Oral Assessment Risk : High Risk Ice Chips Ice chips: Not tested Thin Liquid Thin Liquid: Not tested Nectar Thick Nectar Thick Liquid: Impaired Presentation: Cup Oral Phase Impairments: Reduced labial seal;Reduced lingual movement/coordination;Poor awareness of bolus Oral phase functional implications: Right anterior spillage;Oral residue Honey Thick Honey Thick Liquid: Not tested Puree Puree: Within functional limits Presentation: Spoon Solid Solid: Not tested BSE Assessment Risk for Aspiration Impact on safety and function: Moderate aspiration risk;Mild aspiration risk Other Related Risk Factors: Cognitive impairment;Lethargy;Decreased respiratory status  Short Term Goals: Week 1: SLP Short Term Goal 1 (Week 1): Patient will utilize swallowing strategies during consumpution of D1/NTL diet to reduce s/sx of aspiration with mod multimodal A  Refer to Care Plan for Long Term Goals  Recommendations for other services: None   Discharge Criteria: Patient will be discharged from SLP if patient refuses treatment 3 consecutive times without medical reason, if treatment goals not met, if there is a change in medical status, if patient makes no progress towards goals or if patient is discharged from hospital.  The above assessment, treatment plan, treatment alternatives and goals were discussed and mutually agreed upon: by patient  Rohini Jaroszewski M.A.,  CF-SLP 09/11/2023, 11:21 AM

## 2023-09-11 NOTE — Progress Notes (Addendum)
Inpatient Rehabilitation Admission Medication Review by a Pharmacist  A complete drug regimen review was completed for this patient to identify any potential clinically significant medication issues.  High Risk Drug Classes Is patient taking? Indication by Medication  Antipsychotic Yes, as an intravenous medication PRN Prochlorperazine PO/PR/IV - nausea/vomiting  Anticoagulant Yes Enoxaparin - VTE prophlyaxis  Antibiotic No   Opioid No   Antiplatelet No   Hypoglycemics/insulin Yes Insulin aspar - diabetes/glucose control  Vasoactive Medication Yes Amlodipine, carvedilol, clonidine, hydralazine (scheduled and PRN SBP >140), furosemide, isosorbide mononitrate - hypertension  Chemotherapy No   Other Yes Diclofenac gel (right hand) - topical pain relief Breo + Incruse inhalers - COPD Levetiracetam - seizure prophylaxis Pantoprazole - GERD Rosuvastatin - hyperlipidemia Senna-docusate - laxative  PRNs: Acetaminophen - mild pain Albuterol nebs - wheezing, shortness of breath Maalox - indigestion Bisacodyl, Fleets enema - constipation Diphenhydramine - itching Guaifenesin/dextromethorphan - cough Trazodone - sleep     Type of Medication Issue Identified Description of Issue Recommendation(s)  Drug Interaction(s) (clinically significant)     Duplicate Therapy     Allergy     No Medication Administration End Date     Incorrect Dose     Additional Drug Therapy Needed     Significant med changes from prior encounter (inform family/care partners about these prior to discharge). Eliquis reversed on 11/11 with KCentra.   New clonidine, levetiracetam.  Hydralazine and Amlodipine doses increased while inpatient. Per Neuro, may consider resuming in 2 weeks, 11/25.  Communicate changes with patient/family prior to discharge.  Other  Trelegy PTA > inpatient substitution of Breo + Incruse. Resume Trelegy at discharge.    Clinically significant medication issues were identified that  warrant physician communication and completion of prescribed/recommended actions by midnight of the next day:  No  Pharmacist comments:  - SCDs for VTE prophylaxis > Enoxaparin 30 mg SQ Q24h added on CIR per okay with Dr. Roda Shutters  Time spent performing this drug regimen review (minutes):  [address]   Dennie Fetters, [place] 09/11/2023 11:56 AM

## 2023-09-12 DIAGNOSIS — R208 Other disturbances of skin sensation: Secondary | ICD-10-CM | POA: Diagnosis not present

## 2023-09-12 DIAGNOSIS — I5022 Chronic systolic (congestive) heart failure: Secondary | ICD-10-CM | POA: Diagnosis not present

## 2023-09-12 DIAGNOSIS — I482 Chronic atrial fibrillation, unspecified: Secondary | ICD-10-CM | POA: Diagnosis not present

## 2023-09-12 DIAGNOSIS — I610 Nontraumatic intracerebral hemorrhage in hemisphere, subcortical: Secondary | ICD-10-CM | POA: Diagnosis not present

## 2023-09-12 LAB — BASIC METABOLIC PANEL
Anion gap: 8 (ref 5–15)
BUN: 33 mg/dL — ABNORMAL HIGH (ref 8–23)
CO2: 25 mmol/L (ref 22–32)
Calcium: 8.8 mg/dL — ABNORMAL LOW (ref 8.9–10.3)
Chloride: 109 mmol/L (ref 98–111)
Creatinine, Ser: 1.82 mg/dL — ABNORMAL HIGH (ref 0.61–1.24)
GFR, Estimated: 39 mL/min — ABNORMAL LOW (ref >60)
Glucose, Bld: 163 mg/dL — ABNORMAL HIGH (ref 70–99)
Potassium: 3.8 mmol/L (ref 3.5–5.1)
Sodium: 142 mmol/L (ref 135–145)

## 2023-09-12 LAB — GLUCOSE, CAPILLARY
Glucose-Capillary: 136 mg/dL — ABNORMAL HIGH (ref 70–99)
Glucose-Capillary: 173 mg/dL — ABNORMAL HIGH (ref 70–99)
Glucose-Capillary: 140 mg/dL — ABNORMAL HIGH (ref 70–99)
Glucose-Capillary: 156 mg/dL — ABNORMAL HIGH (ref 70–99)

## 2023-09-12 MED ORDER — ORAL CARE MOUTH RINSE: 15.0000 mL | OROMUCOSAL | Status: DC | PRN

## 2023-09-12 MED ORDER — ORAL CARE MOUTH RINSE
15.0000 mL | OROMUCOSAL | Status: DC
  Administered 2023-09-12 – 2023-10-06 (×90): 15 mL via OROMUCOSAL

## 2023-09-12 MED ORDER — GABAPENTIN 100 MG PO CAPS
100.0000 mg | ORAL_CAPSULE | Freq: Every day | ORAL | Status: DC
  Administered 2023-09-12 – 2023-10-05 (×24): 100 mg via ORAL
  Filled 2023-09-12 (×24): qty 1

## 2023-09-12 NOTE — Progress Notes (Signed)
Occupational Therapy Session Note  Patient Details  Name: ABDULRHMAN AVINO MRN: [id number] Date of Birth: 1951-01-06  {CHL IP REHAB OT TIME CALCULATIONS:[id number]}   Short Term Goals: Week 1:  OT Short Term Goal 1 (Week 1): The pt will complete UB bathing and dressing with MinA consistently at 95% safe OT Short Term Goal 2 (Week 1): The pt will complete LB bathing a dressing using AE at Mod A at 95% safe OT Short Term Goal 3 (Week 1): The pt will transfer to all surfaces with Mod A using the sliding board at 95% safe OT Short Term Goal 4 (Week 1): The pt will maintain good sit balance  consistently at 95% safe OT Short Term Goal 5 (Week 1): The pt will tolerate > 45 Minutes of therapy with minimal restbreaks at 95% safe.  Skilled Therapeutic Interventions/Progress Updates:    Patient agreeable to participate in OT session. Reports *** pain level.   Patient participated in skilled OT session focusing on ***. Therapist facilitated/assessed/developed/educated/integrated/elicited *** in order to improve/facilitate/promote    Therapy Documentation Precautions:  Precautions Precautions: Fall Precaution Comments: R hemiplegia, slight "pusher" Restrictions Weight Bearing Restrictions: No  Therapy/Group: Individual Therapy  Limmie Patricia, OTR/L,CBIS  Supplemental OT - [facility] Secure Chat Preferred   09/12/2023, 9:41 PM

## 2023-09-12 NOTE — Progress Notes (Signed)
Orthopedic Tech Progress Note Patient Details:  Kyle [name] 16-Aug-1951 [id number]  Order for a rehab combo routed to [facility].  Patient ID: MIRANDA MCELHINNY, male   DOB: 03-06-1951, 72 y.o.   MRN: [id number]  Docia Furl 09/12/2023, 6:29 PM

## 2023-09-12 NOTE — Progress Notes (Signed)
Speech Language Pathology Daily Session Note  Patient Details  Name: Kyle [name] MRN: [id number] Date of Birth: 1950/11/26  Today's Date: 09/12/2023 SLP Individual Time: 0915-1015 SLP Individual Time Calculation (min): 60 min  Short Term Goals: Week 1: SLP Short Term Goal 1 (Week 1): Patient will utilize swallowing strategies during consumpution of D1/NTL diet to reduce s/sx of aspiration with mod multimodal A SLP Short Term Goal 2 (Week 1): Patient will participate in dysphagia treatment to demonstrate a functional change in oropharyngeal swallow given modA multimodal SLP Short Term Goal 3 (Week 1): Patient will increase speech intelligibility to 60% at the word level given mod multimodal A SLP Short Term Goal 4 (Week 1): Patient will demonstrate orientation to time given min multimodal A  Skilled Therapeutic Interventions: Pt repositioned with assistance from nursing prior to tx. Oral care completed due to thick secretions in mouth. Skilled intervention focused on diet and speech intelligiblity. Pt seen with 4 oz of NTL via straw. Increased time needed to suck from straw. He tolerated 4 oz of NTL with no overt s/sx of aspriation or penetration. Pt produced functional words requiring increased lingual movment and effort with mod verbal models to increase intellgibilty. Pt increased awareness of improved intellgibility with use of strategies and required less cues by end of session to correct slurred speech. He participated well in skilled slp intervention. Cont therapy per plan of care.   Pain Pain Assessment Pain Scale: Faces Pain Score: 0-No pain Faces Pain Scale: No hurt  Therapy/Group: Individual Therapy  Kyle [name] Kyle [name] 09/12/2023, 10:17 AM

## 2023-09-12 NOTE — Progress Notes (Signed)
PROGRESS NOTE   Subjective/Complaints: Pt slept well. Reports a good day with therapy. Right arm still sensitive to touch.   ROS: Patient denies fever, rash, sore throat, blurred vision, dizziness, nausea, vomiting, diarrhea, cough, shortness of breath or chest pain,  headache, or mood change.    Objective:   DG Chest 2 View  Result Date: 09/10/2023 CLINICAL DATA:  Shortness of breath EXAM: CHEST - 2 VIEW COMPARISON:  09/06/2023 FINDINGS: Cardiomegaly. Mild vascular congestion. No overt edema or effusions. No acute bony abnormality. IMPRESSION: Cardiomegaly, vascular congestion. Electronically Signed   By: Charlett Nose M.D.   On: 09/10/2023 20:06   Recent Labs    09/10/23 0431 09/11/23 0446  WBC 8.0 7.9  HGB 12.4* 12.1*  HCT 38.7* 37.9*  PLT 153 149*   Recent Labs    09/11/23 0446 09/12/23 0519  NA 143 142  K 4.0 3.8  CL 108 109  CO2 24 25  GLUCOSE 129* 163*  BUN 33* 33*  CREATININE 1.81* 1.82*  CALCIUM 8.8* 8.8*    Intake/Output Summary (Last 24 hours) at 09/12/2023 0817 Last data filed at 09/12/2023 2841 Gross per 24 hour  Intake 597 ml  Output --  Net 597 ml     Pressure Injury 09/10/23 Buttocks Right Stage 2 -  Partial thickness loss of dermis presenting as a shallow open injury with a red, pink wound bed without slough. (Active)  09/10/23 1742  Location: Buttocks  Location Orientation: Right  Staging: Stage 2 -  Partial thickness loss of dermis presenting as a shallow open injury with a red, pink wound bed without slough.  Wound Description (Comments):   Present on Admission: Yes    Physical Exam: Vital Signs Blood pressure (!) 127/53, pulse 75, temperature 98.2 F (36.8 C), temperature source Oral, resp. rate 15, height 5\' 11"  (1.803 m), weight 74 kg, SpO2 93%.   Constitutional: No distress . Vital signs reviewed. HEENT: NCAT, EOMI, oral membranes moist Neck: supple Cardiovascular: RRR  without murmur. No JVD    Respiratory/Chest: CTA Bilaterally without wheezes or rales. Normal effort    GI/Abdomen: BS +, non-tender, non-distended Ext: no clubbing, cyanosis, or edema Psych: pleasant and cooperative  Skin: Clean and intact without signs of breakdown Neuro:  pt is alert and oriented. Follows basic commands. Speech remains very dysarthric. Right central 7. RUE 0/5. RLE perhaps trace HE but 0/5 otherwise.  . Decreased LT RUE>RLE. Still dysesthetic with ROM RUE. Toes up Musculoskeletal: Right arm sl tender with movement. No swelling   Assessment/Plan: 1. Functional deficits which require 3+ hours per day of interdisciplinary therapy in a comprehensive inpatient rehab setting. Physiatrist is providing close team supervision and 24 hour management of active medical problems listed below. Physiatrist and rehab team continue to assess barriers to discharge/monitor patient progress toward functional and medical goals  Care Tool:  Bathing    Body parts bathed by patient: Right arm, Chest, Abdomen, Front perineal area, Right upper leg, Left upper leg, Face   Body parts bathed by helper: Right lower leg, Left lower leg, Buttocks, Front perineal area, Left arm     Bathing assist Assist Level: Maximal Assistance - Patient 24 -  49%     Upper Body Dressing/Undressing Upper body dressing   What is the patient wearing?: Pull over shirt    Upper body assist Assist Level: Minimal Assistance - Patient > 75%    Lower Body Dressing/Undressing Lower body dressing      What is the patient wearing?: Pants, Incontinence brief, Hospital gown only     Lower body assist Assist for lower body dressing: Maximal Assistance - Patient 25 - 49%     Toileting Toileting    Toileting assist Assist for toileting: Maximal Assistance - Patient 25 - 49%     Transfers Chair/bed transfer  Transfers assist     Chair/bed transfer assist level: Maximal Assistance - Patient 25 - 49%      Locomotion Ambulation   Ambulation assist   Ambulation activity did not occur: Safety/medical concerns          Walk 10 feet activity   Assist  Walk 10 feet activity did not occur: Safety/medical concerns        Walk 50 feet activity   Assist Walk 50 feet with 2 turns activity did not occur: Safety/medical concerns         Walk 150 feet activity   Assist Walk 150 feet activity did not occur: Safety/medical concerns         Walk 10 feet on uneven surface  activity   Assist Walk 10 feet on uneven surfaces activity did not occur: Safety/medical concerns         Wheelchair     Assist Is the patient using a wheelchair?: Yes Type of Wheelchair: Manual    Wheelchair assist level: Dependent - Patient 0%      Wheelchair 50 feet with 2 turns activity    Assist        Assist Level: Dependent - Patient 0%   Wheelchair 150 feet activity     Assist      Assist Level: Dependent - Patient 0%   Blood pressure (!) 127/53, pulse 75, temperature 98.2 F (36.8 C), temperature source Oral, resp. rate 15, height 5\' 11"  (1.803 m), weight 74 kg, SpO2 93%.  Medical Problem List and Plan: 1. Functional deficits secondary to left basal ganglia ICH d/t hypertension/eliquis             -patient may shower             -ELOS/Goals: 12-16+ days, goals min assist with PT, OT, SLP             - resting WHO and PRAFO for right side pending  -Continue CIR therapies including PT, OT, and SLP   2.  Antithrombotics: -DVT/anticoagulation:  Pharmaceutical: Lovenox added per chat w/Dr. Roda Shutters             -antiplatelet therapy: N/A due to bleed 3. Pain Management: Tylenol prn.             -pt with dysesthesias RUE, consider gabapentin trial  -11/17 will begin low dose gabapentin 100mg  qhs 4. Mood/Behavior/Sleep: LCSW to follow for evaluation and support.              -antipsychotic agents: N/A 5. Neuropsych/cognition: This patient is capable of making decisions  on his own behalf. 6. Skin/Wound Care: Routine pressure relief measures. 7. Fluids/Electrolytes/Nutrition: Monitor I/O. Check CMET in am             --added low salt restrictions. Add protein supplement for    -push po   8. T2DM insulin  dependent: Hgb A1c-6.2 on 09/06/23             -diet controlled   9. PAD s/o aortobifemoral bypass w/right pseudoaneurysm: SBP goal < 140.  --Plans for repair early December by Dr. Sherral Hammers? 10. COPD w/chronic bronchitis/emphysema: currently on 6 L oxygen. Titrate to keep sats > 90% per Dr. Vassie Loll.              --on Trelegy PTA --started on Breo/Incruse today.              --pulmonary toilet. Wean down to baseline as able.  11. CAF: Monitor HR TID. On coreg. Off eliquis due to ICH             --Neurology recommends resuming  Eliquis in 2 weeks if neuro and BP stable.      12. Dilated cardiomyopathy/AS/chronic systolic CHF: Low salt diet. Check weight daily--up from 166-->173?             --CXR 11/11 w/vascular congestion-->will order follow up CXR to check for fluid overload as cause of hypoxia.  --Monitor for signs of overload. On Lasix, coreg, Imdur, hydralazine  -LVEF 55-60%   Filed Weights   09/10/23 1514 09/11/23 0435 09/12/23 0336  Weight: 71.8 kg 73.4 kg 74 kg   11/17 weight up sl more today--but labs stable  -labs tomorrow 13. CKD: stage IIIb?             -11/17 BUN/Cr stable at 33/1.82,              -will hold off on IVF given that weight is up/hx CM  -recheck bmet again tomorrow             -avoid nephrotoxic meds   14. Dysphagia: On D1, nectar              -encourage fluids, aspiration precautions   -see above  LOS: 2 days A FACE TO FACE EVALUATION WAS PERFORMED  Ranelle Oyster 09/12/2023, 8:17 AM

## 2023-09-13 DIAGNOSIS — E1151 Type 2 diabetes mellitus with diabetic peripheral angiopathy without gangrene: Secondary | ICD-10-CM | POA: Diagnosis not present

## 2023-09-13 DIAGNOSIS — I10 Essential (primary) hypertension: Secondary | ICD-10-CM

## 2023-09-13 DIAGNOSIS — I610 Nontraumatic intracerebral hemorrhage in hemisphere, subcortical: Secondary | ICD-10-CM | POA: Diagnosis not present

## 2023-09-13 DIAGNOSIS — I5022 Chronic systolic (congestive) heart failure: Secondary | ICD-10-CM | POA: Diagnosis not present

## 2023-09-13 DIAGNOSIS — Z794 Long term (current) use of insulin: Secondary | ICD-10-CM

## 2023-09-13 DIAGNOSIS — N179 Acute kidney failure, unspecified: Secondary | ICD-10-CM

## 2023-09-13 LAB — CBC
HCT: 37.9 % — ABNORMAL LOW (ref 39.0–52.0)
Hemoglobin: 12.3 g/dL — ABNORMAL LOW (ref 13.0–17.0)
MCH: 29.7 pg (ref 26.0–34.0)
MCHC: 32.5 g/dL (ref 30.0–36.0)
MCV: 91.5 fL (ref 80.0–100.0)
Platelets: 191 10*3/uL (ref 150–400)
RBC: 4.14 MIL/uL — ABNORMAL LOW (ref 4.22–5.81)
RDW: 14.6 % (ref 11.5–15.5)
WBC: 8 10*3/uL (ref 4.0–10.5)
nRBC: 0 % (ref 0.0–0.2)

## 2023-09-13 LAB — GLUCOSE, CAPILLARY
Glucose-Capillary: 149 mg/dL — ABNORMAL HIGH (ref 70–99)
Glucose-Capillary: 198 mg/dL — ABNORMAL HIGH (ref 70–99)
Glucose-Capillary: 138 mg/dL — ABNORMAL HIGH (ref 70–99)
Glucose-Capillary: 202 mg/dL — ABNORMAL HIGH (ref 70–99)

## 2023-09-13 LAB — BASIC METABOLIC PANEL
Anion gap: 11 (ref 5–15)
BUN: 32 mg/dL — ABNORMAL HIGH (ref 8–23)
CO2: 21 mmol/L — ABNORMAL LOW (ref 22–32)
Calcium: 9 mg/dL (ref 8.9–10.3)
Chloride: 108 mmol/L (ref 98–111)
Creatinine, Ser: 1.67 mg/dL — ABNORMAL HIGH (ref 0.61–1.24)
GFR, Estimated: 43 mL/min — ABNORMAL LOW (ref >60)
Glucose, Bld: 174 mg/dL — ABNORMAL HIGH (ref 70–99)
Potassium: 4.2 mmol/L (ref 3.5–5.1)
Sodium: 140 mmol/L (ref 135–145)

## 2023-09-13 NOTE — Progress Notes (Signed)
Patient ID: Kyle [name], male   DOB: 1951/01/06, 72 y.o.   MRN: [id number] Met with the patient and wife to review current situation, rehab schedule, team conference and plan of care. Discussed secondary risk management including HTN, HLD. HF, A-fib and prediabetes (A1C 6.2). Reviewed scheduled medications and dietary modification recommendations. Patient expressed concern about lack of movement in right side and resting splints.  Tolerating D1 Nectar diet; continue supplemental O2 as at home. Continue to follow along to address educational needs to facilitate preparation for discharge. Pamelia Hoit

## 2023-09-13 NOTE — Progress Notes (Signed)
Inpatient Rehabilitation Care Coordinator Assessment and Plan Patient Details  Name: Kyle [name] MRN: [id number] Date of Birth: Sep 17, 1951  Today's Date: 09/13/2023  Hospital Problems: Principal Problem:   ICH (intracerebral hemorrhage) (HCC)  Past Medical History:  Past Medical History:  Diagnosis Date   Aortic insufficiency    MODERATE WITH A BICUSPID AORTIC VAVLE   Arterial occlusive disease    MULTILEVEL   Atrial fibrillation (HCC)    CHF (congestive heart failure) (HCC)    Chronic bronchitis (HCC)    CKD (chronic kidney disease) stage 3, GFR 30-59 ml/min (HCC)    COPD (chronic obstructive pulmonary disease) (HCC)    Dilated cardiomyopathy (HCC)    WITH EJECTION FRACTION DOWN TO 20-25%--WITH CONGESTIVE HEART FAILURE   Edema    LOWER EXTREMETIES   Emphysema of lung (HCC)    Hypertension    Insulin dependent type 2 diabetes mellitus (HCC)    Normal coronary arteries 2009   Orthopnea    Peripheral arterial disease (HCC)    Pulmonary hypertension (HCC)    Past Surgical History:  Past Surgical History:  Procedure Laterality Date   ABDOMINAL AORTAGRAM N/A 11/05/2014   Procedure: ABDOMINAL Ronny Flurry;  Surgeon: Chuck Hint, MD;  Location: [facility] CATH LAB;  Service: Cardiovascular;  Laterality: N/A;   ABDOMINAL AORTOGRAM W/LOWER EXTREMITY Bilateral 04/21/2019   Procedure: ABDOMINAL AORTOGRAM W/LOWER EXTREMITY;  Surgeon: Chuck Hint, MD;  Location: [facility] INVASIVE CV LAB;  Service: Cardiovascular;  Laterality: Bilateral;   ABDOMINAL AORTOGRAM W/LOWER EXTREMITY Right 05/30/2021   Procedure: ABDOMINAL AORTOGRAM W/LOWER EXTREMITY;  Surgeon: Chuck Hint, MD;  Location: [facility] INVASIVE CV LAB;  Service: Cardiovascular;  Laterality: Right;   AORTA - BILATERAL FEMORAL ARTERY BYPASS GRAFT N/A 12/18/2014   Procedure: AORTOBIFEMORAL BYPASS GRAFT;  Surgeon: Chuck Hint, MD;  Location: [facility] OR;  Service: Vascular;  Laterality: N/A;   CARDIAC CATHETERIZATION  2009    Nl Cors, EF 20%   COLONOSCOPY     ENDARTERECTOMY FEMORAL Left 12/18/2014   Procedure: ENDARTERECTOMY FEMORAL;  Surgeon: Chuck Hint, MD;  Location: [facility] OR;  Service: Vascular;  Laterality: Left;   FALSE ANEURYSM REPAIR Right 04/24/2019   Procedure: REPAIR OF RIGHT FEMORAL ARTERY PSEUDOANEURYSM;  Surgeon: Larina Earthly, MD;  Location: [facility] OR;  Service: Vascular;  Laterality: Right;   FEMORAL-POPLITEAL BYPASS GRAFT  02/21/2011   right   FEMORAL-TIBIAL BYPASS GRAFT Right 04/24/2019   Procedure: REDO BYPASS GRAFT RIGHT FEMORAL-TIBIAL ARTERY USING LEFT LEG VEIN & HEMASHIELD GOLD 8mm GRAFT;  Surgeon: Larina Earthly, MD;  Location: [facility] OR;  Service: Vascular;  Laterality: Right;   IR FLUORO GUIDE CV LINE RIGHT  12/28/2019   IR REMOVAL TUN CV CATH W/O FL  01/11/2020   IR US GUIDE VASC ACCESS RIGHT  12/28/2019   OTHER SURGICAL HISTORY     POST RIGHT FEMOROPOPLITEAL BYPASS GRAFT   PERIPHERAL VASCULAR CATHETERIZATION  11/05/2014   Procedure: LOWER EXTREMITY ANGIOGRAPHY;  Surgeon: Chuck Hint, MD;  Location: [facility] CATH LAB;  Service: Cardiovascular;;   VEIN HARVEST Left 04/24/2019   Procedure: LEFT LEG SAPHENOUS VEIN HARVEST;  Surgeon: Larina Earthly, MD;  Location: [facility] OR;  Service: Vascular;  Laterality: Left;   Social History:  reports that he quit smoking about 4 years ago. His smoking use included cigarettes. He started smoking about 44 years ago. He has a 40 pack-year smoking history. He has never been exposed to tobacco smoke. He has never used smokeless tobacco. He reports that he  does not drink alcohol and does not use drugs.  Family / Support Systems Marital Status: Married How Long?: 27 years Patient Roles: Spouse, Parent Spouse/Significant Other: Kyle [name] (wife) [phone] Children: Blended family. Pt has one son- Kyle [name]- lives in [place]. Pt wife has two children; dtr lives in [place], and son lives in [place] and is a truck Hospital doctor. Wife's children likely to assist PRN on the  weekends. Other Supports: none Anticipated Caregiver: wife Ability/Limitations of Caregiver: NO concerns reported at this time. Caregiver Availability: 24/7 Family Dynamics: Pt lives with his wife.  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 Education: high school grad Health Literacy - How often do you need to have someone help you when you read instructions, pamphlets, or other written material from your doctor or pharmacy?: Never Writes: Yes Employment Status: Retired Date Retired/Disabled/Unemployed: 2010 Marine scientist Issues: wife denies Guardian/Conservator: N/A   Abuse/Neglect Abuse/Neglect Assessment Can Be Completed: Yes Physical Abuse: Denies Verbal Abuse: Denies Sexual Abuse: Denies Exploitation of patient/patient's resources: Denies Self-Neglect: Denies  Patient response to: Social Isolation - How often do you feel lonely or isolated from those around you?: Never  Emotional Status Pt's affect, behavior and adjustment status: Pt in good spirits. Garbarled speech at times and slow to respond but able to answer questions. Recent Psychosocial Issues: Denies Psychiatric History: Denies Substance Abuse History: Pt wife reports he quit smoking cigarettes 44 years ago.  Patient / Family Perceptions, Expectations & Goals Pt/Family understanding of illness & functional limitations: Pt and family have a general understanding of pt care needs Premorbid pt/family roles/activities: Independent Anticipated changes in roles/activities/participation: Assistance with ADL/IADLs Pt/family expectations/goals: pt goal is to work on walking and talking. Pt wife is poisitve about his outcomes since he has a good attitude.  Community Resources [employer]: None Premorbid Home Care/DME Agencies: None Transportation available at discharge: TBD Is the  patient able to respond to transportation needs?: Yes In the past 12 months, has lack of transportation kept you from medical appointments or from getting medications?: No In the past 12 months, has lack of transportation kept you from meetings, work, or from getting things needed for daily living?: No Resource referrals recommended: Neuropsychology  Discharge Planning Living Arrangements: Spouse/significant other Support Systems: Spouse/significant other, Other relatives Type of Residence: Private residence Insurance Resources: Media planner (specify) ([employer] Medicare) Financial Resources: Restaurant manager, fast food Screen Referred: No Living Expenses: Rent (15 steps to get to second floor apartment) Money Management: Spouse, Patient Does the patient have any problems obtaining your medications?: No Home Management: Both help manage home care needs; wife prepares meals. Patient/Family Preliminary Plans: TBD Care Coordinator Barriers to Discharge: Decreased caregiver support, Lack of/limited family support, Insurance for SNF coverage, New oxygen Care Coordinator Anticipated Follow Up Needs: HH/OP Expected length of stay: 3-4 weeks  Clinical Impression SW met with pt in room to complete assessment and used collateral reports from wife as well. Pt is not a Cytogeneticist. No HCPOA. No DME. Aware primary SW will follow-up to provide continued updates.   Mechille Varghese A Maybree Riling 09/13/2023, 5:03 PM

## 2023-09-13 NOTE — Progress Notes (Signed)
Patient ID: Kyle [name], male   DOB: [birth year], 72 y.o.   MRN: [id number]  This SW covering for primary SW, Lavera Guise.   12- SW spoke with pt wife to introduce self, explain role, discuss discharge process, inform on ELOS, and primary SW will follow-up with updates after team conference on Wednesday. They live in a second floor apartment with 15 steps to enter. She confirms she is primary caregiver and PRN support from her children on likely weekends since they work full time. Unsure on amount of support from patient's son. She remains hopeful about his recovery.    Cecile Sheerer, MSW, LCSW Office: [phone] Cell: [phone] Fax: [phone]

## 2023-09-13 NOTE — Progress Notes (Signed)
Speech Language Pathology Daily Session Note  Patient Details  Name: Kyle [name] MRN: [id number] Date of Birth: 1951-04-05  Today's Date: 09/13/2023 SLP Individual Time: 1103-1206 SLP Individual Time Calculation (min): 63 min  Short Term Goals: Week 1: SLP Short Term Goal 1 (Week 1): Patient will utilize swallowing strategies during consumpution of D1/NTL diet to reduce s/sx of aspiration with mod multimodal A SLP Short Term Goal 2 (Week 1): Patient will participate in dysphagia treatment to demonstrate a functional change in oropharyngeal swallow given modA multimodal SLP Short Term Goal 3 (Week 1): Patient will increase speech intelligibility to 60% at the word level given mod multimodal A SLP Short Term Goal 4 (Week 1): Patient will demonstrate orientation to time given min multimodal A  Skilled Therapeutic Interventions:   Pt was seen in late am to address dysphagia management and speech intelligibility. Pt was alert and seated upright in WC with NT and his wife present. Pt reporting desire for oral hygiene. Upon inspection, pt with thick white secretions coating dorsum of tongue along with sticky opaque substances around floor of mouth, alveolar ridge, and hard palate. Upon further inspection observed substance to be pt's denture adhesive Polygrip. Wife reports completing thorough cleaning of dentures. SLP subsequently completed through oral hygiene with ability to clear tongue, floor of mouth and alveolar ridge. Minimal remaining Poly grip remained on hard palate however pt requested discontinuation. SLP provided mouth moisturizer for lip protection. Pt subsequently requesting to don dentures which improved pt's speech intelligibility to ~50% in words. SLP began session by reviewing speech intelligibility strategies including over articulation and separating words. SLP challenged pt in structured task of communicating words and phrases in unknown context to his wife. Pt requiring mod cues  for over articulation and separting words with improved speech intelligibility to 70%. SLP encouraged pt to use strategies of speaking in short simple phrases and use of nonverbal communication for support. At conclusion of session, pt was left seated upright in [facility] with call button within reach, chair alarm active, and wife present. SLP to continue POC.   Pain Pain Assessment Pain Scale: 0-10 Pain Score: 0-No pain  Therapy/Group: Individual Therapy  Renaee Munda 09/13/2023, 2:49 PM

## 2023-09-13 NOTE — Progress Notes (Signed)
Inpatient Rehabilitation  Patient information reviewed and entered into eRehab system by Oyuki Hogan M. Eri Mcevers, M.A., CCC/SLP, PPS Coordinator.  Information including medical coding, functional ability and quality indicators will be reviewed and updated through discharge.    

## 2023-09-13 NOTE — IPOC Note (Signed)
Overall Plan of Care [facility]) Patient Details Name: Kyle [name] MRN: [id number] DOB: 12/03/1950  Admitting Diagnosis: ICH (intracerebral hemorrhage) [facility])  Hospital Problems: Principal Problem:   ICH (intracerebral hemorrhage) (HCC)     Functional Problem List: Nursing Bowel, Safety, Endurance, Medication Management, Nutrition  PT Balance, Safety, Endurance, Motor  OT Balance, Endurance, Safety  SLP Cognition, Nutrition  TR         Basic ADL's: OT Bathing, Dressing, Toileting, Eating, Grooming     Advanced  ADL's: OT       Transfers: PT Bed Mobility, Bed to Chair, Car, Occupational psychologist, Research scientist (life sciences): PT Ambulation, Psychologist, prison and probation services, Stairs     Additional Impairments: OT    SLP Swallowing, Communication, Social Cognition expression    TR      Anticipated Outcomes Item Anticipated Outcome  Self Feeding ModI  Swallowing  supA   Basic self-care  ModI  Toileting  ModI   Bathroom Transfers ModI  Bowel/Bladder  manage bowel w mod I assist  Transfers  CGA  Locomotion  CGA w/ LRAD  Communication  supA  Cognition  supA  Pain  n/a  Safety/Judgment  manage w cues   Therapy Plan: PT Intensity: Minimum of 1-2 x/day ,45 to 90 minutes PT Frequency: 5 out of 7 days PT Duration Estimated Length of Stay: 3-4 weeks. OT Intensity: Minimum of 1-2 x/day, 45 to 90 minutes OT Frequency: 5 out of 7 days, Total of 15 hours over 7 days of combined therapies OT Duration/Estimated Length of Stay: 3-4 wks SLP Intensity: Minumum of 1-2 x/day, 30 to 90 minutes SLP Frequency: 3 to 5 out of 7 days SLP Duration/Estimated Length of Stay: 12-16 days   Team Interventions: Nursing Interventions Patient/Family Education, Dysphagia/Aspiration Precaution Training, Medication Management, Discharge Planning, Bowel Management, Disease Management/Prevention  PT interventions Ambulation/gait training, Community reintegration, Neuromuscular re-education, Stair  training, UE/LE Strength taining/ROM, Wheelchair propulsion/positioning, Warden/ranger, Discharge planning, Therapeutic Activities, UE/LE Coordination activities, Patient/family education, Functional mobility training, Therapeutic Exercise  OT Interventions Balance/vestibular training, Neuromuscular re-education, Self Care/advanced ADL retraining, Therapeutic Exercise, UE/LE Coordination activities, DME/adaptive equipment instruction, UE/LE Strength taining/ROM, Therapeutic Activities  SLP Interventions Cognitive remediation/compensation, Internal/external aids, Speech/Language facilitation, Cueing hierarchy, Therapeutic Activities, Functional tasks, Multimodal communication approach, Patient/family education, Therapeutic Exercise  TR Interventions    SW/CM Interventions Discharge Planning, Psychosocial Support, Patient/Family Education   Barriers to Discharge MD  Medical stability and Poor sitting balance  Nursing Home environment access/layout, Decreased caregiver support, Nutrition means 1 level on 2nd floor, 15 ste and bil rail w spouse; Second floor apartment for 22 years. Wife stated dtr and son in law will assist getting him in and out of apartment. They live in [place], [name]. Do not want to move to first floor.  PT Inaccessible home environment 15 STE apartment  OT      SLP      SW Decreased caregiver support, Lack of/limited family support, Insurance for SNF coverage, New oxygen     Team Discharge Planning: Destination: PT-Home ,OT- Home , SLP-Home Projected Follow-up: PT-Home health PT, OT-  Home health OT (for transitional needs), SLP-Outpatient SLP, 24 hour supervision/assistance, Home Health SLP Projected Equipment Needs: PT-To be determined, OT- 3 in 1 bedside comode, SLP-None recommended by SLP Equipment Details: PT-Pt has no equipment at home., OT-TBD Patient/family involved in discharge planning: PT- Patient,  OT-Patient, SLP-Patient  MD ELOS: 21-24d Medical  Rehab Prognosis:  Fair Assessment: The patient has been admitted for CIR  therapies with the diagnosis of Left ICH. The team will be addressing functional mobility, strength, stamina, balance, safety, adaptive techniques and equipment, self-care, bowel and bladder mgt, patient and caregiver education, BP management fluid management . Goals have been set at Sup/MinA. Anticipated discharge destination is Home .        See Team Conference Notes for weekly updates to the plan of care

## 2023-09-13 NOTE — Care Management (Signed)
Inpatient Rehabilitation Center Individual Statement of Services  Patient Name:  Kyle [name]  Date:  09/13/2023  Welcome to the Inpatient Rehabilitation Center.  Our goal is to provide you with an individualized program based on your diagnosis and situation, designed to meet your specific needs.  With this comprehensive rehabilitation program, you will be expected to participate in at least 3 hours of rehabilitation therapies Monday-Friday, with modified therapy programming on the weekends.  Your rehabilitation program will include the following services:  Physical Therapy (PT), Occupational Therapy (OT), Speech Therapy (ST), 24 hour per day rehabilitation nursing, Therapeutic Recreaction (TR), Psychology, Neuropsychology, Care Coordinator, Rehabilitation Medicine, Nutrition Services, Pharmacy Services, and Other  Weekly team conferences will be held on Wednesdays to discuss your progress.  Your Inpatient Rehabilitation Care Coordinator will talk with you frequently to get your input and to update you on team discussions.  Team conferences with you and your family in attendance may also be held.  Expected length of stay: 3-4 weeks  Overall anticipated outcome: Contact Guard/Supervision  Depending on your progress and recovery, your program may change. Your Inpatient Rehabilitation Care Coordinator will coordinate services and will keep you informed of any changes. Your Inpatient Rehabilitation Care Coordinator's name and contact numbers are listed  below.  The following services may also be recommended but are not provided by the Inpatient Rehabilitation Center:  Driving Evaluations Home Health Rehabiltiation Services Outpatient Rehabilitation Services Vocational Rehabilitation   Arrangements will be made to provide these services after discharge if needed.  Arrangements include referral to agencies that provide these services.  Your insurance has been verified to be:  [employer]  Your primary doctor is:  Levin Erp  Pertinent information will be shared with your doctor and your insurance company.  Inpatient Rehabilitation Care Coordinator:  Lavera Guise, [place] [phone] or [phone]  Information discussed with and copy given to patient by: Gretchen Short, 09/13/2023, 2:47 PM

## 2023-09-13 NOTE — Progress Notes (Signed)
Orthopedic Tech Progress Note Patient Details:  LAVARR DURNIN 06/04/1951 [id number]  Patient ID: [name], male   DOB: [birth year], 72 y.o.   MRN: [id number]  Lovett Calender 09/13/2023, 3:05 PM [facility] DELIVERED DEVICES TO PATIENT

## 2023-09-13 NOTE — Progress Notes (Signed)
Physical Therapy Session Note  Patient Details  Name: Kyle [name] MRN: [id number] Date of Birth: [birth year]  Today's Date: 09/13/2023 PT Individual Time: 1027-2536 PT Individual Time Calculation (min): 64 min   Short Term Goals: Week 1:  PT Short Term Goal 1 (Week 1): Pt will roll side to side w/ mod A. PT Short Term Goal 2 (Week 1): Pt will transfer sup to sit w/ mod A. PT Short Term Goal 3 (Week 1): Pt will perform squat pivot transfer w/ mod A.  Skilled Therapeutic Interventions/Progress Updates:  Patient supine in bed on entrance to room. Lying with RLE in frog leg positioning. No tone noted with grossly 0/5 activation, so unsure of how pt positioned RLE. Patient alert and agreeable to PT session.   On 4L O2 throughout session and WFL when checked.   Patient with no pain complaint at start of session.  Pt relates that his R side does not work. But when asked re: how he is going to reach his apartment up 15 steps, pt states, "I can do it." Demonstrating reduced awareness of impairments to functional mobility.   Therapeutic Activity: Bed Mobility: Pt performed supine > sit with Mod/ MaxA to reach seated position on EOB. Light 1/5 tension noted in R quad during positioning in to hooklying for RLE to assist with push into L sidelying with maxA. Also able to grasp R arm with L hand to improve position prior to roll to L side. Requires Mod to MinA to attain seated balance. Once attained, pt is able to sit with CGA/ supervision using LUE to prop. Is able to initiate return to midline with minor LOB posteriorly.  Transfers: Pt performed squat pivot transfer from bed to w/c toward L side with MaxA +2. And MaxA +2 at end of session to return to bed to L side while using bedrail for LUE support. Requires extensive cueing for improved safety  and technique d/t impulsivity as well as flaccid RUE and RLE.   Gait Training:  Pt ambulated 30' x2 using L handrail in hallway with MaxA to advance RLE  as well as to stabilize knee during stance phase. Pt impulsive and allowed to attempt to rise to stand with light assist in order to understand connection between R hemiplegia and lack of functional mobility. Requires multimodal cues throughout for technique and sequencing - requires facilitation with vc for lateral weight shift, forward hip/ pelvic translation, knee extension with block during stance phase and heavy heel/ foot strike for improved sensation at end of TotA swing phase. Throughout ambulation bouts, pt demos impulsive advance of LLE prior to extension and requiring block to R knee to prevent full buckle.   Neuromuscular Re-ed: NMR facilitated during session with focus on standing balance, body proprioception. Pt guided in upright stance with L hand on hallway HR and guided in attempt to improve pt's understanding of lack of control of R side and need for full assist in order to stand. Guided in lateral weight shift requiring block to R knee when shifting to R. Pt continues to demo reduced awareness and understanding of connection of R impairment to mobility impairment.    NMR performed for improvements in motor control and coordination, balance, sequencing, judgement, and self confidence/ efficacy in performing all aspects of mobility at highest level of independence.   Patient supine in bed at end of session with brakes locked, bed alarm set, and all needs within reach. Attempted to provide pt with sip of thickened drink  but is unable to hold drink in mouth.   Therapy Documentation Precautions:  Precautions Precautions: Fall Precaution Comments: R hemiplegia, slight "pusher", SBP <140 Restrictions Weight Bearing Restrictions: No  Pain:  No pain related this session.   Therapy/Group: Individual Therapy  Loel Dubonnet PT, DPT, CSRS 09/13/2023, 5:43 PM

## 2023-09-13 NOTE — Progress Notes (Signed)
PROGRESS NOTE   Subjective/Complaints:  Pt without issues overnite , per OT pt has poor sitting balance , will need high backed chair  ROS: Patient denies CP, SOB, Arm pain    Objective:   No results found. Recent Labs    09/11/23 0446 09/13/23 0538  WBC 7.9 8.0  HGB 12.1* 12.3*  HCT 37.9* 37.9*  PLT 149* 191   Recent Labs    09/11/23 0446 09/12/23 0519  NA 143 142  K 4.0 3.8  CL 108 109  CO2 24 25  GLUCOSE 129* 163*  BUN 33* 33*  CREATININE 1.81* 1.82*  CALCIUM 8.8* 8.8*    Intake/Output Summary (Last 24 hours) at 09/13/2023 0932 Last data filed at 09/13/2023 4401 Gross per 24 hour  Intake 674 ml  Output --  Net 674 ml     Pressure Injury 09/10/23 Buttocks Right Stage 2 -  Partial thickness loss of dermis presenting as a shallow open injury with a red, pink wound bed without slough. (Active)  09/10/23 1742  Location: Buttocks  Location Orientation: Right  Staging: Stage 2 -  Partial thickness loss of dermis presenting as a shallow open injury with a red, pink wound bed without slough.  Wound Description (Comments):   Present on Admission: Yes    Physical Exam: Vital Signs Blood pressure 132/64, pulse 71, temperature 98.5 F (36.9 C), temperature source Oral, resp. rate 16, height 5\' 11"  (1.803 m), weight 71.8 kg, SpO2 90%.    General: No acute distress Mood and affect are appropriate Heart: Regular rate and rhythm no rubs murmurs or extra sounds Lungs: Clear to auscultation, breathing unlabored, no rales or wheezes Abdomen: Positive bowel sounds, soft nontender to palpation, nondistended Extremities: No clubbing, cyanosis, or edema Skin: No evidence of breakdown, no evidence of rash  Neuro:  pt is alert and oriented. Follows basic commands. Speech remains very dysarthric. Right central 7. RUE 0/5..  . Decreased LT RUE. Musculoskeletal: Right arm sl tender with movement. No swelling  +  impingement on RIGHT Assessment/Plan: 1. Functional deficits which require 3+ hours per day of interdisciplinary therapy in a comprehensive inpatient rehab setting. Physiatrist is providing close team supervision and 24 hour management of active medical problems listed below. Physiatrist and rehab team continue to assess barriers to discharge/monitor patient progress toward functional and medical goals  Care Tool:  Bathing    Body parts bathed by patient: Right arm, Chest, Abdomen, Front perineal area, Right upper leg, Left upper leg, Face   Body parts bathed by helper: Right lower leg, Left lower leg, Buttocks, Front perineal area, Left arm     Bathing assist Assist Level: Maximal Assistance - Patient 24 - 49%     Upper Body Dressing/Undressing Upper body dressing   What is the patient wearing?: Pull over shirt    Upper body assist Assist Level: Minimal Assistance - Patient > 75%    Lower Body Dressing/Undressing Lower body dressing      What is the patient wearing?: Pants, Incontinence brief, Hospital gown only     Lower body assist Assist for lower body dressing: Maximal Assistance - Patient 25 - 49%     Toileting  Toileting    Toileting assist Assist for toileting: Maximal Assistance - Patient 25 - 49%     Transfers Chair/bed transfer  Transfers assist     Chair/bed transfer assist level: Maximal Assistance - Patient 25 - 49%     Locomotion Ambulation   Ambulation assist   Ambulation activity did not occur: Safety/medical concerns          Walk 10 feet activity   Assist  Walk 10 feet activity did not occur: Safety/medical concerns        Walk 50 feet activity   Assist Walk 50 feet with 2 turns activity did not occur: Safety/medical concerns         Walk 150 feet activity   Assist Walk 150 feet activity did not occur: Safety/medical concerns         Walk 10 feet on uneven surface  activity   Assist Walk 10 feet on uneven  surfaces activity did not occur: Safety/medical concerns         Wheelchair     Assist Is the patient using a wheelchair?: Yes Type of Wheelchair: Manual    Wheelchair assist level: Dependent - Patient 0%      Wheelchair 50 feet with 2 turns activity    Assist        Assist Level: Dependent - Patient 0%   Wheelchair 150 feet activity     Assist      Assist Level: Dependent - Patient 0%   Blood pressure 132/64, pulse 71, temperature 98.5 F (36.9 C), temperature source Oral, resp. rate 16, height 5\' 11"  (1.803 m), weight 71.8 kg, SpO2 90%.  Medical Problem List and Plan: 1. Functional deficits secondary to left basal ganglia ICH d/t hypertension/eliquis             -patient may shower             -ELOS/Goals: 12-16+ days, goals min assist with PT, OT, SLP             - resting WHO and PRAFO for right side pending  -Continue CIR therapies including PT, OT, and SLP   2.  Antithrombotics: -DVT/anticoagulation:  Pharmaceutical: Lovenox added per chat w/Dr. Roda Shutters             -antiplatelet therapy: N/A due to bleed 3. Pain Management: Tylenol prn.             -pt with dysesthesias RUE, consider gabapentin trial  -11/17 will begin low dose gabapentin 100mg  qhs 4. Mood/Behavior/Sleep: LCSW to follow for evaluation and support.              -antipsychotic agents: N/A 5. Neuropsych/cognition: This patient is capable of making decisions on his own behalf. 6. Skin/Wound Care: Routine pressure relief measures. 7. Fluids/Electrolytes/Nutrition: Monitor I/O. Check CMET in am             --added low salt restrictions. Add protein supplement for    -push po   8. T2DM insulin dependent: Hgb A1c-6.2 on 09/06/23             -diet controlled CBG (last 3)  Recent Labs    09/12/23 1628 09/12/23 2041 09/13/23 0543  GLUCAP 140* 156* 149*      9. PAD s/o aortobifemoral bypass w/right pseudoaneurysm: SBP goal < 140.  --Plans for repair early December by Dr. Sherral Hammers? 10.  COPD w/chronic bronchitis/emphysema: currently on 6 L oxygen. Titrate to keep sats > 90% per Dr. Vassie Loll.              --  on Trelegy PTA --started on Breo/Incruse today.              --pulmonary toilet. Wean down to baseline as able.  11. CAF: Monitor HR TID. On coreg. Off eliquis due to ICH             --Neurology recommends resuming  Eliquis in 2 weeks if neuro and BP stable.      12. Dilated cardiomyopathy/AS/chronic systolic CHF: Low salt diet. Check weight daily--up from 166-->173?             --CXR 11/11 w/vascular congestion-->will order follow up CXR to check for fluid overload as cause of hypoxia.  --Monitor for signs of overload. On Lasix, coreg, Imdur, hydralazine  -LVEF 55-60%   Filed Weights   09/11/23 0435 09/12/23 0336 09/13/23 0333  Weight: 73.4 kg 74 kg 71.8 kg   11/17 weight up sl more today--but labs stable  -labs tomorrow 13. CKD: stage IIIb?             -11/17 BUN/Cr stable at 33/1.82,              -will hold off on IVF given that weight is up/hx CM      Latest Ref Rng & Units 09/12/2023    5:19 AM 09/11/2023    4:46 AM 09/10/2023    4:31 AM  BMP  Glucose 70 - 99 mg/dL 295  621  308   BUN 8 - 23 mg/dL 33  33  29   Creatinine 0.61 - 1.24 mg/dL 6.57  8.46  9.62   Sodium 135 - 145 mmol/L 142  143  141   Potassium 3.5 - 5.1 mmol/L 3.8  4.0  4.0   Chloride 98 - 111 mmol/L 109  108  109   CO2 22 - 32 mmol/L 25  24  23    Calcium 8.9 - 10.3 mg/dL 8.8  8.8  8.7       14. Dysphagia: On D1, nectar              -encourage fluids, aspiration precautions   -see above  LOS: 3 days A FACE TO FACE EVALUATION WAS PERFORMED  Erick Colace 09/13/2023, 9:32 AM

## 2023-09-13 NOTE — Plan of Care (Signed)
  Problem: Consults Goal: RH STROKE PATIENT EDUCATION Description: See Patient Education module for education specifics  Outcome: Progressing   Problem: RH BOWEL ELIMINATION Goal: RH STG MANAGE BOWEL WITH ASSISTANCE Description: STG Manage Bowel with toileting Assistance. Outcome: Progressing Goal: RH STG MANAGE BOWEL W/MEDICATION W/ASSISTANCE Description: STG Manage Bowel with Medication with mod I Assistance. Outcome: Progressing   Problem: RH SAFETY Goal: RH STG ADHERE TO SAFETY PRECAUTIONS W/ASSISTANCE/DEVICE Description: STG Adhere to Safety Precautions With cues Assistance/Device. Outcome: Progressing   Problem: RH KNOWLEDGE DEFICIT Goal: RH STG INCREASE KNOWLEDGE OF DIABETES Description: Patient and wife will be able to manage prediabetes with dietary modification using educational resources independently Outcome: Progressing Goal: RH STG INCREASE KNOWLEDGE OF HYPERTENSION Description: Patient and wife will be able to manage HTN with medications and dietary modification using educational resources independently Outcome: Progressing Goal: RH STG INCREASE KNOWLEDGE OF DYSPHAGIA/FLUID INTAKE Description: Patient and wife will be able to manage dysphagia, medications and dietary modification using educational resources independently Outcome: Progressing Goal: RH STG INCREASE KNOWLEGDE OF HYPERLIPIDEMIA Description: Patient and wife will be able to manage HLD with medications and dietary modification using educational resources independently Outcome: Progressing   Problem: Education: Goal: Ability to describe self-care measures that may prevent or decrease complications (Diabetes Survival Skills Education) will improve Outcome: Progressing Goal: Individualized Educational Video(s) Outcome: Progressing   Problem: Coping: Goal: Ability to adjust to condition or change in health will improve Outcome: Progressing   Problem: Fluid Volume: Goal: Ability to maintain a balanced  intake and output will improve Outcome: Progressing   Problem: Health Behavior/Discharge Planning: Goal: Ability to identify and utilize available resources and services will improve Outcome: Progressing Goal: Ability to manage health-related needs will improve Outcome: Progressing   Problem: Metabolic: Goal: Ability to maintain appropriate glucose levels will improve Outcome: Progressing

## 2023-09-13 NOTE — Progress Notes (Addendum)
   09/13/23 1335  What Happened  Was fall witnessed? Yes  Who witnessed fall? Corey (ST) and NT  Patients activity before fall other (comment) (up to chair)  Point of contact buttocks  Was patient injured? No  Provider Notification  Provider Name/Title Delle Reining, [place]  Date Provider Notified 09/13/23  Time Provider Notified 1335  Method of Notification Call  Notification Reason Fall  Provider response Evaluate remotely  Date of Provider Response 09/13/23  Time of Provider Response 1335  Follow Up  Family notified Yes - comment  Time family notified 1335  Additional tests No  Progress note created (see row info) Yes  Blank note created Yes  Adult Fall Risk Assessment  Risk Factor Category (scoring not indicated) High fall risk per protocol (document High fall risk)  Age 72  Fall History: Fall within 6 months prior to admission 0  Elimination; Bowel and/or Urine Incontinence 0  Elimination; Bowel and/or Urine Urgency/Frequency 0  Medications: includes PCA/Opiates, Anti-convulsants, Anti-hypertensives, Diuretics, Hypnotics, Laxatives, Sedatives, and Psychotropics 5  Patient Care Equipment 2  Mobility-Assistance 2  Mobility-Gait 2  Mobility-Sensory Deficit 0  Altered awareness of immediate physical environment 0  Impulsiveness 0  Lack of understanding of one's physical/cognitive limitations 0  Total Score 13  Patient Fall Risk Level High fall risk  Adult Fall Risk Interventions  Required Bundle Interventions *See Row Information* High fall risk - low, moderate, and high requirements implemented  Additional Interventions Use of appropriate toileting equipment (bedpan, BSC, etc.)  Fall intervention(s) refused/Patient educated regarding refusal Bed alarm;Nonskid socks  Screening for Fall Injury Risk (To be completed on HIGH fall risk patients) - Assessing Need for Floor Mats  Risk For Fall Injury- Criteria for Floor Mats Admitted as a result of a fall  Will Implement Floor Mats  Yes  Vitals  Temp 98.6 F (37 C)  Temp Source Oral  BP (!) 164/61  MAP (mmHg) 90  BP Location Left Arm  BP Method Automatic  Patient Position (if appropriate) Lying  Pulse Rate 77  Pulse Rate Source Monitor  Resp (!) 21  Oxygen Therapy  SpO2 100 %  O2 Device Nasal Cannula  O2 Flow Rate (L/min) 4 L/min  Pain Assessment  Pain Scale 0-10  Pain Score 0  PCA/Epidural/Spinal Assessment  Respiratory Pattern Regular  Neurological  Neuro (WDL) X  Level of Consciousness Alert  Orientation Level Oriented X4  Cognition Follows commands  Speech Slurred/Dysarthria  Motor Function/Sensation Assessment Grip;Motor response  R Hand Grip Absent  L Hand Grip Moderate  RUE Motor Response Non-purposeful movement  LUE Motor Response Purposeful movement  RLE Motor Response Non-purposeful movement  LLE Motor Response Purposeful movement  Neuro Symptoms None  Musculoskeletal  Musculoskeletal (WDL) X  Assistive Device Stedy  Generalized Weakness Yes  Weight Bearing Restrictions No  Musculoskeletal Details  RUE Paralysis  RLE Paralysis  Integumentary  Integumentary (WDL) X  Skin Color Appropriate for ethnicity  Skin Condition Dry  Skin Integrity Ecchymosis  Ecchymosis Location Arm  Skin Turgor Non-tenting

## 2023-09-14 DIAGNOSIS — I10 Essential (primary) hypertension: Secondary | ICD-10-CM | POA: Diagnosis not present

## 2023-09-14 DIAGNOSIS — I5022 Chronic systolic (congestive) heart failure: Secondary | ICD-10-CM | POA: Diagnosis not present

## 2023-09-14 DIAGNOSIS — I610 Nontraumatic intracerebral hemorrhage in hemisphere, subcortical: Secondary | ICD-10-CM | POA: Diagnosis not present

## 2023-09-14 DIAGNOSIS — E1151 Type 2 diabetes mellitus with diabetic peripheral angiopathy without gangrene: Secondary | ICD-10-CM | POA: Diagnosis not present

## 2023-09-14 LAB — GLUCOSE, CAPILLARY
Glucose-Capillary: 156 mg/dL — ABNORMAL HIGH (ref 70–99)
Glucose-Capillary: 198 mg/dL — ABNORMAL HIGH (ref 70–99)
Glucose-Capillary: 133 mg/dL — ABNORMAL HIGH (ref 70–99)
Glucose-Capillary: 195 mg/dL — ABNORMAL HIGH (ref 70–99)

## 2023-09-14 NOTE — Progress Notes (Signed)
Inpatient Rehabilitation Care Coordinator Assessment and Plan Patient Details  Name: Kyle [name] MRN: [id number] Date of Birth: Aug 29, 1951  Today's Date: 09/14/2023  Hospital Problems: Principal Problem:   ICH (intracerebral hemorrhage) (HCC)  Past Medical History:  Past Medical History:  Diagnosis Date   Aortic insufficiency    MODERATE WITH A BICUSPID AORTIC VAVLE   Arterial occlusive disease    MULTILEVEL   Atrial fibrillation (HCC)    CHF (congestive heart failure) (HCC)    Chronic bronchitis (HCC)    CKD (chronic kidney disease) stage 3, GFR 30-59 ml/min (HCC)    COPD (chronic obstructive pulmonary disease) (HCC)    Dilated cardiomyopathy (HCC)    WITH EJECTION FRACTION DOWN TO 20-25%--WITH CONGESTIVE HEART FAILURE   Edema    LOWER EXTREMETIES   Emphysema of lung (HCC)    Hypertension    Insulin dependent type 2 diabetes mellitus (HCC)    Normal coronary arteries 2009   Orthopnea    Peripheral arterial disease (HCC)    Pulmonary hypertension (HCC)    Past Surgical History:  Past Surgical History:  Procedure Laterality Date   ABDOMINAL AORTAGRAM N/A 11/05/2014   Procedure: ABDOMINAL Ronny Flurry;  Surgeon: Chuck Hint, MD;  Location: [facility] CATH LAB;  Service: Cardiovascular;  Laterality: N/A;   ABDOMINAL AORTOGRAM W/LOWER EXTREMITY Bilateral 04/21/2019   Procedure: ABDOMINAL AORTOGRAM W/LOWER EXTREMITY;  Surgeon: Chuck Hint, MD;  [facility] INVASIVE CV LAB;  Service: Cardiovascular;  Laterality: Bilateral;   ABDOMINAL AORTOGRAM W/LOWER EXTREMITY Right 05/30/2021   Procedure: ABDOMINAL AORTOGRAM W/LOWER EXTREMITY;  Surgeon: Chuck Hint, MD;  Location: [facility] INVASIVE CV LAB;  Service: Cardiovascular;  Laterality: Right;   AORTA - BILATERAL FEMORAL ARTERY BYPASS GRAFT N/A 12/18/2014   Procedure: AORTOBIFEMORAL BYPASS GRAFT;  Surgeon: Chuck Hint, MD;  Location: [facility] OR;  Service: Vascular;  Laterality: N/A;   CARDIAC CATHETERIZATION  2009    Nl Cors, EF 20%   COLONOSCOPY     ENDARTERECTOMY FEMORAL Left 12/18/2014   Procedure: ENDARTERECTOMY FEMORAL;  Surgeon: Chuck Hint, MD;  Location: [facility] OR;  Service: Vascular;  Laterality: Left;   FALSE ANEURYSM REPAIR Right 04/24/2019   Procedure: REPAIR OF RIGHT FEMORAL ARTERY PSEUDOANEURYSM;  Surgeon: Larina Earthly, MD;  Location: [facility] OR;  Service: Vascular;  Laterality: Right;   FEMORAL-POPLITEAL BYPASS GRAFT  02/21/2011   right   FEMORAL-TIBIAL BYPASS GRAFT Right 04/24/2019   Procedure: REDO BYPASS GRAFT RIGHT FEMORAL-TIBIAL ARTERY USING LEFT LEG VEIN & HEMASHIELD GOLD 8mm GRAFT;  Surgeon: Larina Earthly, MD;  Location: [facility] OR;  Service: Vascular;  Laterality: Right;   IR FLUORO GUIDE CV LINE RIGHT  12/28/2019   IR REMOVAL TUN CV CATH W/O FL  01/11/2020   IR US GUIDE VASC ACCESS RIGHT  12/28/2019   OTHER SURGICAL HISTORY     POST RIGHT FEMOROPOPLITEAL BYPASS GRAFT   PERIPHERAL VASCULAR CATHETERIZATION  11/05/2014   Procedure: LOWER EXTREMITY ANGIOGRAPHY;  Surgeon: Chuck Hint, MD;  Location: [facility] CATH LAB;  Service: Cardiovascular;;   VEIN HARVEST Left 04/24/2019   Procedure: LEFT LEG SAPHENOUS VEIN HARVEST;  Surgeon: Larina Earthly, MD;  Location: [facility] OR;  Service: Vascular;  Laterality: Left;   Social History:  reports that he quit smoking about 4 years ago. His smoking use included cigarettes. He started smoking about 44 years ago. He has a 40 pack-year smoking history. He has never been exposed to tobacco smoke. He has never used smokeless tobacco. He reports that he  does not drink alcohol and does not use drugs.  Family / Support Systems Marital Status: Married How Long?: 27 years Patient Roles: Spouse, Parent Spouse/Significant Other: Reedley Lions (wife) [phone] Children: Blended family. Pt has one son- Leny II- lives in [place]. Pt wife has two children; dtr lives in [place], and son lives in [place] and is a truck Hospital doctor. Wife's children likely to assist PRN on the  weekends. Other Supports: none Anticipated Caregiver: wife Ability/Limitations of Caregiver: NO concerns reported at this time. Caregiver Availability: 24/7 Family Dynamics: Pt lives with his wife.  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 Education: high school grad Health Literacy - How often do you need to have someone help you when you read instructions, pamphlets, or other written material from your doctor or pharmacy?: Never Writes: Yes Employment Status: Retired Date Retired/Disabled/Unemployed: 2010 Marine scientist Issues: wife denies Guardian/Conservator: N/A   Abuse/Neglect Abuse/Neglect Assessment Can Be Completed: Yes Physical Abuse: Denies Verbal Abuse: Denies Sexual Abuse: Denies Exploitation of patient/patient's resources: Denies Self-Neglect: Denies  Patient response to: Social Isolation - How often do you feel lonely or isolated from those around you?: Never  Emotional Status Pt's affect, behavior and adjustment status: Pt in good spirits. Garbarled speech at times and slow to respond but able to answer questions. Recent Psychosocial Issues: Denies Psychiatric History: Denies Substance Abuse History: Pt wife reports he quit smoking cigarettes 44 years ago.  Patient / Family Perceptions, Expectations & Goals Pt/Family understanding of illness & functional limitations: Pt and family have a general understanding of pt care needs Premorbid pt/family roles/activities: Independent Anticipated changes in roles/activities/participation: Assistance with ADL/IADLs Pt/family expectations/goals: pt goal is to work on walking and talking. Pt wife is poisitve about his outcomes since he has a good attitude.  Community Resources [employer]: None Premorbid Home Care/DME Agencies: None Transportation available at discharge: TBD Is the  patient able to respond to transportation needs?: Yes In the past 12 months, has lack of transportation kept you from medical appointments or from getting medications?: No In the past 12 months, has lack of transportation kept you from meetings, work, or from getting things needed for daily living?: No Resource referrals recommended: Neuropsychology  Discharge Planning Living Arrangements: Spouse/significant other Support Systems: Spouse/significant other, Other relatives Type of Residence: Private residence Insurance Resources: Media planner (specify) ([employer] Medicare) Financial Resources: Restaurant manager, fast food Screen Referred: No Living Expenses: Rent (15 steps to get to second floor apartment) Money Management: Spouse, Patient Does the patient have any problems obtaining your medications?: No Home Management: Both help manage home care needs; wife prepares meals. Patient/Family Preliminary Plans: TBD Care Coordinator Barriers to Discharge: Decreased caregiver support, Lack of/limited family support, Insurance for SNF coverage, New oxygen Care Coordinator Anticipated Follow Up Needs: HH/OP Expected length of stay: 3-4 weeks  Clinical Impression Covering Sw met with patient and spouse on 09/13/23. Covering for primary SW, [employer].    81- SW spoke with pt wife to introduce self, explain role, discuss discharge process, inform on ELOS, and primary SW will follow-up with updates after team conference on Wednesday. They live in a second floor apartment with 15 steps to enter. She confirms she is primary caregiver and PRN support from her children on likely weekends since they work full time. Unsure on amount of support from patient's son. She remains hopeful about his recovery.  Andria Rhein 09/14/2023, 11:09 AM

## 2023-09-14 NOTE — Progress Notes (Signed)
PROGRESS NOTE   Subjective/Complaints:  Reviewed labs  ROS: Patient denies CP, SOB, Arm pain    Objective:   No results found. Recent Labs    09/13/23 0538  WBC 8.0  HGB 12.3*  HCT 37.9*  PLT 191   Recent Labs    09/12/23 0519 09/13/23 1228  NA 142 140  K 3.8 4.2  CL 109 108  CO2 25 21*  GLUCOSE 163* 174*  BUN 33* 32*  CREATININE 1.82* 1.67*  CALCIUM 8.8* 9.0    Intake/Output Summary (Last 24 hours) at 09/14/2023 0739 Last data filed at 09/13/2023 1751 Gross per 24 hour  Intake 600 ml  Output --  Net 600 ml     Pressure Injury 09/10/23 Buttocks Right Stage 2 -  Partial thickness loss of dermis presenting as a shallow open injury with a red, pink wound bed without slough. (Active)  09/10/23 1742  Location: Buttocks  Location Orientation: Right  Staging: Stage 2 -  Partial thickness loss of dermis presenting as a shallow open injury with a red, pink wound bed without slough.  Wound Description (Comments):   Present on Admission: Yes    Physical Exam: Vital Signs Blood pressure 116/64, pulse 80, temperature 98.7 F (37.1 C), resp. rate 17, height 5\' 11"  (1.803 m), weight 74.4 kg, SpO2 90%.    General: No acute distress Mood and affect are appropriate Heart: Regular rate and rhythm no rubs murmurs or extra sounds Lungs: Clear to auscultation, breathing unlabored, no rales or wheezes Abdomen: Positive bowel sounds, soft nontender to palpation, nondistended Extremities: No clubbing, cyanosis, or edema Skin: No evidence of breakdown, no evidence of rash  Neuro:  pt is alert and oriented. Follows basic commands. Speech remains very dysarthric. Right central 7. RUE 0/5..  . Decreased LT RUE. Musculoskeletal: Right arm sl tender with movement. No swelling  + impingement on RIGHT Assessment/Plan: 1. Functional deficits which require 3+ hours per day of interdisciplinary therapy in a comprehensive  inpatient rehab setting. Physiatrist is providing close team supervision and 24 hour management of active medical problems listed below. Physiatrist and rehab team continue to assess barriers to discharge/monitor patient progress toward functional and medical goals  Care Tool:  Bathing    Body parts bathed by patient: Right arm, Chest, Abdomen, Front perineal area, Right upper leg, Left upper leg, Face   Body parts bathed by helper: Right lower leg, Left lower leg, Buttocks, Front perineal area, Left arm     Bathing assist Assist Level: Maximal Assistance - Patient 24 - 49%     Upper Body Dressing/Undressing Upper body dressing   What is the patient wearing?: Pull over shirt    Upper body assist Assist Level: Minimal Assistance - Patient > 75%    Lower Body Dressing/Undressing Lower body dressing      What is the patient wearing?: Pants, Incontinence brief, Hospital gown only     Lower body assist Assist for lower body dressing: Maximal Assistance - Patient 25 - 49%     Toileting Toileting    Toileting assist Assist for toileting: Maximal Assistance - Patient 25 - 49%     Transfers Chair/bed transfer  Transfers  assist     Chair/bed transfer assist level: Maximal Assistance - Patient 25 - 49%     Locomotion Ambulation   Ambulation assist   Ambulation activity did not occur: Safety/medical concerns          Walk 10 feet activity   Assist  Walk 10 feet activity did not occur: Safety/medical concerns        Walk 50 feet activity   Assist Walk 50 feet with 2 turns activity did not occur: Safety/medical concerns         Walk 150 feet activity   Assist Walk 150 feet activity did not occur: Safety/medical concerns         Walk 10 feet on uneven surface  activity   Assist Walk 10 feet on uneven surfaces activity did not occur: Safety/medical concerns         Wheelchair     Assist Is the patient using a wheelchair?: Yes Type  of Wheelchair: Manual    Wheelchair assist level: Dependent - Patient 0%      Wheelchair 50 feet with 2 turns activity    Assist        Assist Level: Dependent - Patient 0%   Wheelchair 150 feet activity     Assist      Assist Level: Dependent - Patient 0%   Blood pressure 116/64, pulse 80, temperature 98.7 F (37.1 C), resp. rate 17, height 5\' 11"  (1.803 m), weight 74.4 kg, SpO2 90%.  Medical Problem List and Plan: 1. Functional deficits secondary to left basal ganglia ICH d/t hypertension/eliquis             -patient may shower             -ELOS/Goals: 12-16+ days, goals min assist with PT, OT, SLP             - resting WHO and PRAFO for right side pending  -Continue CIR therapies including PT, OT, and SLP   2.  Antithrombotics: -DVT/anticoagulation:  Pharmaceutical: Lovenox added per chat w/Dr. Roda Shutters             -antiplatelet therapy: N/A due to bleed 3. Pain Management: Tylenol prn.             -pt with dysesthesias RUE, consider gabapentin trial  -11/17 will begin low dose gabapentin 100mg  qhs 4. Mood/Behavior/Sleep: LCSW to follow for evaluation and support.              -antipsychotic agents: N/A 5. Neuropsych/cognition: This patient is capable of making decisions on his own behalf. 6. Skin/Wound Care: Routine pressure relief measures. 7. Fluids/Electrolytes/Nutrition: Monitor I/O. Check CMET in am             --added low salt restrictions. Add protein supplement for    -push po   8. T2DM insulin dependent: Hgb A1c-6.2 on 09/06/23             -diet controlled CBG (last 3)  Recent Labs    09/13/23 1653 09/13/23 2111 09/14/23 0621  GLUCAP 138* 202* 156*   Elevated yest pm monitor trend    9. PAD s/o aortobifemoral bypass w/right pseudoaneurysm: SBP goal < 140.  --Plans for repair early December by Dr. Sherral Hammers? 10. COPD w/chronic bronchitis/emphysema: currently on 6 L oxygen. Titrate to keep sats > 90% per Dr. Vassie Loll.              --on Trelegy PTA  --started on Breo/Incruse today.              --  pulmonary toilet. Wean down to baseline as able.  11. CAF: Monitor HR TID. On coreg. Off eliquis due to ICH- per Neuro may resume on 11/25 if stable             --Neurology recommends resuming  Eliquis in 2 weeks if neuro and BP stable.     HR controlled  Vitals:   09/13/23 1957 09/14/23 0451  BP: 133/62 116/64  Pulse: 69 80  Resp: 17 17  Temp: 98.7 F (37.1 C) 98.7 F (37.1 C)  SpO2: 91% 90%    12. Dilated cardiomyopathy/AS/chronic systolic CHF: Low salt diet. Check weight daily--up from 166-->173?             --CXR 11/11 w/vascular congestion-->will order follow up CXR to check for fluid overload as cause of hypoxia.  --Monitor for signs of overload. On Lasix, coreg, Imdur, hydralazine  -LVEF 55-60%   Filed Weights   09/12/23 0336 09/13/23 0333 09/14/23 0451  Weight: 74 kg 71.8 kg 74.4 kg   11/17 weight up sl more today--but labs stable  -labs tomorrow 13. CKD: stage IIIb?             -11/17 BUN/Cr stable at 33/1.82, 11/19 baseline creat likely in 1.6-1.8 range based on labs in system         Improving , fluid intake recorded at      Latest Ref Rng & Units 09/13/2023   12:28 PM 09/12/2023    5:19 AM 09/11/2023    4:46 AM  BMP  Glucose 70 - 99 mg/dL 034  742  595   BUN 8 - 23 mg/dL 32  33  33   Creatinine 0.61 - 1.24 mg/dL 6.38  7.56  4.33   Sodium 135 - 145 mmol/L 140  142  143   Potassium 3.5 - 5.1 mmol/L 4.2  3.8  4.0   Chloride 98 - 111 mmol/L 108  109  108   CO2 22 - 32 mmol/L 21  25  24    Calcium 8.9 - 10.3 mg/dL 9.0  8.8  8.8       14. Dysphagia: On D1, nectar              -encourage fluids, aspiration precautions   -see above  LOS: 4 days A FACE TO FACE EVALUATION WAS PERFORMED  Erick Colace 09/14/2023, 7:39 AM

## 2023-09-14 NOTE — Plan of Care (Signed)
  Problem: Consults Goal: RH STROKE PATIENT EDUCATION Description: See Patient Education module for education specifics  Outcome: Progressing   Problem: RH BOWEL ELIMINATION Goal: RH STG MANAGE BOWEL WITH ASSISTANCE Description: STG Manage Bowel with toileting Assistance. Outcome: Progressing Goal: RH STG MANAGE BOWEL W/MEDICATION W/ASSISTANCE Description: STG Manage Bowel with Medication with mod I Assistance. Outcome: Progressing   Problem: RH SAFETY Goal: RH STG ADHERE TO SAFETY PRECAUTIONS W/ASSISTANCE/DEVICE Description: STG Adhere to Safety Precautions With cues Assistance/Device. Outcome: Progressing   Problem: RH KNOWLEDGE DEFICIT Goal: RH STG INCREASE KNOWLEDGE OF DIABETES Description: Patient and wife will be able to manage prediabetes with dietary modification using educational resources independently Outcome: Progressing Goal: RH STG INCREASE KNOWLEDGE OF HYPERTENSION Description: Patient and wife will be able to manage HTN with medications and dietary modification using educational resources independently Outcome: Progressing Goal: RH STG INCREASE KNOWLEDGE OF DYSPHAGIA/FLUID INTAKE Description: Patient and wife will be able to manage dysphagia, medications and dietary modification using educational resources independently Outcome: Progressing Goal: RH STG INCREASE KNOWLEGDE OF HYPERLIPIDEMIA Description: Patient and wife will be able to manage HLD with medications and dietary modification using educational resources independently Outcome: Progressing   Problem: Education: Goal: Ability to describe self-care measures that may prevent or decrease complications (Diabetes Survival Skills Education) will improve Outcome: Progressing Goal: Individualized Educational Video(s) Outcome: Progressing   Problem: Coping: Goal: Ability to adjust to condition or change in health will improve Outcome: Progressing   Problem: Fluid Volume: Goal: Ability to maintain a balanced  intake and output will improve Outcome: Progressing   Problem: Health Behavior/Discharge Planning: Goal: Ability to identify and utilize available resources and services will improve Outcome: Progressing Goal: Ability to manage health-related needs will improve Outcome: Progressing   Problem: Metabolic: Goal: Ability to maintain appropriate glucose levels will improve Outcome: Progressing

## 2023-09-14 NOTE — Progress Notes (Signed)
Physical Therapy Session Note  Patient Details  Name: Kyle [name] MRN: [id number] Date of Birth: 01-16-1951  Today's Date: 09/14/2023 PT Individual Time: 0902-1004 PT Individual Time Calculation (min): 62 min   Short Term Goals: Week 1:  PT Short Term Goal 1 (Week 1): Pt will roll side to side w/ mod A. PT Short Term Goal 2 (Week 1): Pt will transfer sup to sit w/ mod A. PT Short Term Goal 3 (Week 1): Pt will perform squat pivot transfer w/ mod A.  Skilled Therapeutic Interventions/Progress Updates:  Patient supine in bed on entrance to room. Wife arrives to room shortly after therapist and tech. Patient alert and agreeable to PT session.   Patient with no pain complaint at start of session.  Pt's brief is soiled and and he is agreeable to bed level pericare, brief change and dressing. Wife provides clothes for day.   Therapeutic Activity: Bed Mobility: Pt performed roll to R side with vc for RLE hooklying position and reaches over for bedrail to complete with CGA. Rolls to back with supervision, then requires vc/ tc with MaxA  RLE positioning into hooklying, is able to produce adduction with RLE in hooklying position with MinA demonstrating at least 2-/ 5 for RLE hip adductors. Is able to maintain knees together to complete LB turn to L side. Requires vc/ tc for LUE to assist RUE across chest and then turn head to facilitate completion of turn to L  side with Min/ ModA to shoulder. Performed throughout pericare.  Once in sidelying to L pt provided with vc to pushup with L elbow and use trunk muscles to rise up to seated position with mod/ maxA.  Forward scoot to EOB with MaxA.   Transfers: Pt performed sit<>stand transfers throughout session with use of L handrail in hallway and MaxA for power up with Mod/ Max A to complete. Descent to sit with overall ModA and bias to L side.  Provided verbal cues for technique and positioning to prepare throughout.   Gait Training:  Pt ambulated 30  ft using L handrail in hallway with MaxA to advance RLE as well as to stabilize knee during stance phase. Pt cued for proper forward hand placement to improve rise to stand. Facilitated over BLE and pt is able to rise up with MaxA for power up and then Mod/ MaxA to complete. Requires multimodal cues throughout first ambulation bout for technique and sequencing - requires facilitation with vc for lateral weight shift, forward hip/ pelvic translation, knee extension with block during stance phase and heavy heel/ foot strike for improved sensation at end of TotA swing phase. During first ambulation bout, pt demos impulsive advance of LLE prior to extension and requiring block to R knee to prevent full buckle. During second bout, pt provided with consistent vc for sequencing step with above listed physical facilitations with improved reciprocation but with continued MaxA. Few instances of impulsive step forward with LLE prior to good position with RLE.    Neuromuscular Re-ed: NMR facilitated during session with focus on sitting balance and trunk control. Pt guided in lateral leans while seated in w/c. Instructed to bring elbow on each side out to therapist's palms requiring lateral trunk bend to reach. Assist provided to R arm. Encouragement and instructions required for maintaining unsupported sitting with facilitation into R sided lean. Is able to return to midline.   NMR performed for improvements in motor control and coordination, balance, sequencing, judgement, and self confidence/ efficacy in performing  all aspects of mobility at highest level of independence.   Patient seated upright in TIS w/c at end of session with brakes locked, belt alarm set, and all needs within reach. Wife providing supervision.    Therapy Documentation Precautions:  Precautions Precautions: Fall Precaution Comments: R hemiplegia, slight "pusher", SBP <140 Restrictions Weight Bearing Restrictions: No  Vital Signs: Oxygen  Therapy SpO2: 88 - 95 % when checked throughout session.  O2 Device: Nasal Cannula O2 Flow Rate (L/min): 4 L/min Pain: Pain Assessment Pain Scale: 0-10 Pain Score: 0-No pain related this session.   Therapy/Group: Individual Therapy  Loel Dubonnet PT, DPT, CSRS 09/14/2023, 10:23 AM

## 2023-09-14 NOTE — Progress Notes (Signed)
Occupational Therapy Session Note  Patient Details  Name: Kyle [name] MRN: [id number] Date of Birth: 1950/12/24  Today's Date: 09/14/2023 OT Individual Time: 1120-1212 OT Individual Time Calculation (min): 52 min    Short Term Goals: Week 1:  OT Short Term Goal 1 (Week 1): The pt will complete UB bathing and dressing with MinA consistently at 95% safe OT Short Term Goal 2 (Week 1): The pt will complete LB bathing a dressing using AE at Mod A at 95% safe OT Short Term Goal 3 (Week 1): The pt will transfer to all surfaces with Mod A using the sliding board at 95% safe OT Short Term Goal 4 (Week 1): The pt will maintain good sit balance  consistently at 95% safe OT Short Term Goal 5 (Week 1): The pt will tolerate > 45 Minutes of therapy with minimal restbreaks at 95% safe.  Skilled Therapeutic Interventions/Progress Updates:    Pt received in bed, he was dressed and ready for the day. [name] present.  From supine, worked on RUE PROM and then movements to try to increase tone response. Mild tone felt in biceps and shoulder.   Pt cued to roll onto L shoulder with using L hand to bring R arm across body, then sat up from sidelying with mod A.  Pt initially pushing back but with cues and pillow support able to hold static sit.  Pt tends to move quickly and impulsively and needs cues to slow down and follow directions.  He needed urinal but was unable to hold bladder.  Worked on sit to stands with mod A and static stand with mod-max A and cues to lean to L and concentrate on holding balance as OT stabilized him and rehab tech assisted with changing brief and cleansing pt.  During transitional movement, pt would intermittently yell out as if he was hurting but when questioned he stated he had no pain. Pt may be vocalizing during effort.  Pt sat at EOB with tray table under R arm, pt able to sit in static position with no lean.  Estim applied to deltoids and wrist/finger extensors pt able to  tolerate an intensity of 30 using standard estim mode on module for 10 seconds on and 10 off.  Pt tolerated estim well for a total of 15 minutes.  He worked on attending to his hand with cues to try to open his fingers wider. Integrated a functional reach task of grasp and release of bottle of soap.     At end of session, pt moved back into supine in bed as he stated the w/c was very uncomfortable for him at this time.  Set pt upright to prep for lunch and adjusted pillows under R arm.  Do not recommend kinesiotaping at this time as the tape will interfere with the estim pad placement and the estim will provide more sensory input.   Pt will need to have shoulder supported in sitting.   LTGs set at [facility] on Admission.  Due to flaccid R hemiplegia, balance and cognitive deficits goals will be downgraded to CGA to min A overall.   Pt resting in bed with all needs met. Alarm set and call light in reach.    Therapy Documentation Precautions:  Precautions Precautions: Fall Precaution Comments: R hemiplegia, slight "pusher", SBP <140 Restrictions Weight Bearing Restrictions: No    Vital Signs: Therapy Vitals Temp: 98.7 F (37.1 C) Pulse Rate: 80 Resp: 17 BP: 116/64 Patient Position (if appropriate): Sitting  Oxygen Therapy SpO2: 90 % O2 Device: Nasal Cannula O2 Flow Rate (L/min): 5 L/min Pain:  No c/o pain  ADL: ADL Equipment Provided:  (Stedyx2) Eating: Minimal assistance Where Assessed-Eating: Bed level Grooming: Minimal assistance Where Assessed-Grooming: Edge of bed Upper Body Bathing: Moderate assistance (secondary to challenges with functional sit balance and hemiparesis associate with dominate RUE) Where Assessed-Upper Body Bathing: Edge of bed Lower Body Bathing: Maximal assistance Where Assessed-Lower Body Bathing: Edge of bed Upper Body Dressing: Moderate assistance Where Assessed-Upper Body Dressing: Edge of bed Lower Body Dressing: Maximal assistance Where  Assessed-Lower Body Dressing: Edge of bed Toileting: Dependent Where Assessed-Toileting: Bed level (brief) Toilet Transfer: Maximal assistance (using stedy x2 based on observation) Toilet Transfer Method:  Antony Salmon x2 based on observation) Tub/Shower Transfer: Maximal assistance (Stedy x 2) Tub/Shower Transfer Method:  Antony Salmon) Tub/Shower Equipment: Marine scientist with wheels based on observation) Film/video editor: Maximal assistance (Using the stedy) Film/video editor Method:  Antony Salmon) Astronomer: Information systems manager with back ADL Comments: Patient inconsistent with unsupported sitting   Therapy/Group: Individual Therapy  Denette Hass 09/14/2023, 8:40 AM

## 2023-09-14 NOTE — Progress Notes (Signed)
Speech Language Pathology Daily Session Note  Patient Details  Name: Kyle [name] MRN: [id number] Date of Birth: 12-24-1950  Today's Date: 09/14/2023 SLP Individual Time: 0800-0900 SLP Individual Time Calculation (min): 60 min  Short Term Goals: Week 1: SLP Short Term Goal 1 (Week 1): Patient will utilize swallowing strategies during consumpution of D1/NTL diet to reduce s/sx of aspiration with mod multimodal A SLP Short Term Goal 2 (Week 1): Patient will participate in dysphagia treatment to demonstrate a functional change in oropharyngeal swallow given modA multimodal SLP Short Term Goal 3 (Week 1): Patient will increase speech intelligibility to 60% at the word level given mod multimodal A SLP Short Term Goal 4 (Week 1): Patient will demonstrate orientation to time given min multimodal A  Skilled Therapeutic Interventions:  Patient was seen in am to address dysphagia management. Pt was alert and seen at bedside upon SLP arrival. Assigned NT present and assisted with meal set up and exited the room shortly after. Pt was presented with current diet of dys 1 and NTL. Pt administered all trials. He benefited from min A cues for oral clearance throughout session. He continued to present with anterior spillage to R side with cues for awareness and visual feedback. SLP presented trials of Dys 2 solids which pt administered. Pt warranted min A cues for bolus amount, pacing, and cues for liquid wash due to diffuse lingual residue. Pt with no s/sx pen/asp. SLP recommending continuation of Dys 1 solids and NTL with full supervision. SLP also began implementation of pharyngeal strengthening exercises. Pt with declining position across session warranting repositioning upright before initiating pharyngeal exercises.  Pt completed 1 set of 10 effortful swallows and 1 set of 10 CTARs with sup A. He warranted max A for completion of [place] and/or modified [place] however, pt unsuccessful in completion. In  additional minutes of session, SLP addressed speech intelligibility through verbal production of personal and biographical information. Pt required mod A for pausing between words and over articulation with improved speech intelligibility to 75% in words and 50-60% in phrases. Pt indep oriented to month, year, day of week, date, and upcoming holiday. At conclusion of session, pt was left in bed with call button within reach and bed alarm active. SLP to continue POC.  Pain Pain Assessment Pain Scale: 0-10 Pain Score: 0-No pain  Therapy/Group: Individual Therapy  Renaee Munda 09/14/2023, 8:58 AM

## 2023-09-14 NOTE — Plan of Care (Signed)
Problem: RH Balance Goal: LTG: Patient will maintain dynamic sitting balance (OT) Description: LTG:  Patient will maintain dynamic sitting balance with assistance during activities of daily living (OT) Flowsheets (Taken 09/14/2023 1714) LTG: Pt will maintain dynamic sitting balance during ADLs with: (LTGs set at Mod Ind on Admission.  Due to flaccid R hemiplegia, balance and cognitive deficits goals will be downgraded to CGA to min A overall) Supervision/Verbal cueing Note: LTGs set at Mod Ind on Admission.  Due to flaccid R hemiplegia, balance and cognitive deficits goals will be downgraded to CGA to min A overall Goal: LTG Patient will maintain dynamic standing with ADLs (OT) Description: LTG:  Patient will maintain dynamic standing balance with assist during activities of daily living (OT)  Flowsheets (Taken 09/14/2023 1714) LTG: Pt will maintain dynamic standing balance during ADLs with: (LTGs set at Mod Ind on Admission.  Due to flaccid R hemiplegia, balance and cognitive deficits goals will be downgraded to CGA to min A overall) Contact Guard/Touching assist Note: LTGs set at Mod Ind on Admission.  Due to flaccid R hemiplegia, balance and cognitive deficits goals will be downgraded to CGA to min A overall   Problem: Sit to Stand Goal: LTG:  Patient will perform sit to stand in prep for activites of daily living with assistance level (OT) Description: LTG:  Patient will perform sit to stand in prep for activites of daily living with assistance level (OT) Flowsheets (Taken 09/14/2023 1714) LTG: PT will perform sit to stand in prep for activites of daily living with assistance level: (LTGs set at Mod Ind on Admission.  Due to flaccid R hemiplegia, balance and cognitive deficits goals will be downgraded to CGA to min A overall) Contact Guard/Touching assist Note: LTGs set at Mod Ind on Admission.  Due to flaccid R hemiplegia, balance and cognitive deficits goals will be downgraded to CGA to min  A overall   Problem: RH Grooming Goal: LTG Patient will perform grooming w/assist,cues/equip (OT) Description: LTG: Patient will perform grooming with assist, with/without cues using equipment (OT) Flowsheets (Taken 09/14/2023 1714) LTG: Pt will perform grooming with assistance level of: (LTGs set at Mod Ind on Admission.  Due to flaccid R hemiplegia, balance and cognitive deficits goals will be downgraded to S) Set up assist  Note: LTGs set at Mod Ind on Admission.  Due to flaccid R hemiplegia, balance and cognitive deficits goals will be downgraded to S   Problem: RH Bathing Goal: LTG Patient will bathe all body parts with assist levels (OT) Description: LTG: Patient will bathe all body parts with assist levels (OT) Flowsheets (Taken 09/14/2023 1714) LTG: Pt will perform bathing with assistance level/cueing: (LTGs set at Mod Ind on Admission.  Due to flaccid R hemiplegia, balance and cognitive deficits goals will be downgraded to CGA to min A overall) Minimal Assistance - Patient > 75% Note: LTGs set at Mod Ind on Admission.  Due to flaccid R hemiplegia, balance and cognitive deficits goals will be downgraded to CGA to min A overall   Problem: RH Dressing Goal: LTG Patient will perform lower body dressing w/assist (OT) Description: LTG: Patient will perform lower body dressing with assist, with/without cues in positioning using equipment (OT) Flowsheets (Taken 09/14/2023 1714) LTG: Pt will perform lower body dressing with assistance level of: (LTGs set at Mod Ind on Admission.  Due to flaccid R hemiplegia, balance and cognitive deficits goals will be downgraded to CGA to min A overall) Minimal Assistance - Patient > 75% Note: LTGs  set at [facility] Ind on Admission.  Due to flaccid R hemiplegia, balance and cognitive deficits goals will be downgraded to CGA to min A overall   Problem: RH Toileting Goal: LTG Patient will perform toileting task (3/3 steps) with assistance level (OT) Description:  LTG: Patient will perform toileting task (3/3 steps) with assistance level (OT)  Flowsheets (Taken 09/14/2023 1714) LTG: Pt will perform toileting task (3/3 steps) with assistance level: (LTGs set at Mod Ind on Admission.  Due to flaccid R hemiplegia, balance and cognitive deficits goals will be downgraded to CGA to min A overall) Minimal Assistance - Patient > 75% Note: LTGs set at Mod Ind on Admission.  Due to flaccid R hemiplegia, balance and cognitive deficits goals will be downgraded to CGA to min A overall   Problem: RH Functional Use of Upper Extremity Goal: LTG Patient will use RT/LT upper extremity as a (OT) Description: LTG: Patient will use right/left upper extremity as a stabilizer/gross assist/diminished/nondominant/dominant level with assist, with/without cues during functional activity (OT) Flowsheets (Taken 09/14/2023 1714) LTG: Use of upper extremity in functional activities: RUE as a stabilizer LTG: Pt will use upper extremity in functional activity with assistance level of: (LTGs set at Mod Ind on Admission.  Due to flaccid R hemiplegia, balance and cognitive deficits goals will be downgraded) Supervision/Verbal cueing Note: LTGs set at Mod Ind on Admission.  Due to flaccid R hemiplegia, balance and cognitive deficits goals will be downgraded    Problem: RH Toilet Transfers Goal: LTG Patient will perform toilet transfers w/assist (OT) Description: LTG: Patient will perform toilet transfers with assist, with/without cues using equipment (OT) Flowsheets (Taken 09/14/2023 1714) LTG: Pt will perform toilet transfers with assistance level of: (LTGs set at Mod Ind on Admission.  Due to flaccid R hemiplegia, balance and cognitive deficits goals will be downgraded to CGA to min A overall) Contact Guard/Touching assist Note: LTGs set at Mod Ind on Admission.  Due to flaccid R hemiplegia, balance and cognitive deficits goals will be downgraded to CGA to min A overall   Problem: RH  Tub/Shower Transfers Goal: LTG Patient will perform tub/shower transfers w/assist (OT) Description: LTG: Patient will perform tub/shower transfers with assist, with/without cues using equipment (OT) Flowsheets (Taken 09/14/2023 1714) LTG: Pt will perform tub/shower stall transfers with assistance level of: (LTGs set at Mod Ind on Admission.  Due to flaccid R hemiplegia, balance and cognitive deficits goals will be downgraded to CGA to min A overall) Minimal Assistance - Patient > 75% Note: LTGs set at Mod Ind on Admission.  Due to flaccid R hemiplegia, balance and cognitive deficits goals will be downgraded to CGA to min A overall   Problem: RH Memory Goal: LTG Patient will demonstrate ability for day to day recall/carry over during activities of daily living with assistance level (OT) Description: LTG:  Patient will demonstrate ability for day to day recall/carry over during activities of daily living with assistance level (OT). Flowsheets (Taken 09/14/2023 1714) LTG:  Patient will demonstrate ability for day to day recall/carry over during activities of daily living with assistance level (OT): Supervision

## 2023-09-14 NOTE — Progress Notes (Signed)
Physical Therapy Session Note  Patient Details  Name: Kyle [name] MRN: [id number] Date of Birth: 09-03-1951  Today's Date: 09/14/2023 PT Individual Time: 1335-1419 PT Individual Time Calculation (min): 44 min   Short Term Goals: Week 1:  PT Short Term Goal 1 (Week 1): Pt will roll side to side w/ mod A. PT Short Term Goal 2 (Week 1): Pt will transfer sup to sit w/ mod A. PT Short Term Goal 3 (Week 1): Pt will perform squat pivot transfer w/ mod A.  Skilled Therapeutic Interventions/Progress Updates: Patient supine in bed with RN present on entrance to room. Patient alert and agreeable to PT session.   Patient reported no pain at beginning of PT session. Pt was soiled and required brief to be changed at beginning of session. Pt performed supine to R sidelying with minA and VC to flex at L knee to bridge hips over (pt already using R railing to assist in pulling self over). Pt supine to L sidelying with maxA with VC for pt to use L UE to place R UE across torso (and to increase awareness to R UE as pt would attempt to roll, leaving R UE on bed), and to flex R LE at knee with PTA providing maxA (PTA also approximating R knee with VC for pt to push LE into bed to bridge over). Pt performed multiple rolls in order for PTA and rehab tech to doff/donn brief, pants and bedding. PTA threaded L LE through pant leg with maxA (VC for pt to lift leg to improve neuromuscular connection and attention to R LE), and minA with L LE. Pt cued to donn rest of pants up to waist (PTA donned up to mid thigh) with minA - pt bridged accordingly with appropriate sequence and VC throughout to donn one hip at a time. PTA donned B shoes with totalA, and VC to breath through nostrils while donning L shoe due to hypersensitivity. Pt then supine to sit EOB with mod/maxA, and VC to bring R UE across torso, and to advance R LE off bed without assistance, and with maxA on R LE. Pt then provided with VC to elevate trunk with L UE  pushing into bed. Pt demos minA to maintain sitting balance (pt with L UE on bed to hold self up - VC to place in lap showed minA to maintain). Pt then anteriorly scoot to EOB with maxA and VC to assist using L UE to advance L hip forward. Pt then sit to stand with maxA (PTA on R with blocking R knee). Pt cued to place L UE by pt's side vs across midline to PTA's shoulder. Pt required maxA to prevent buckling on R LE. Pt then cued to reach back to bed with L UE to sit on EOB with maxA to control descent. Pt required 3 mini squat pivots laterally to scoot close to [facility] prior to transitioning to supine with heavy mod/maxA, and VC to anteriorly shift weight/use of L UE to push. Pt then maxA to advance R LE back onto bed. Pt required totalA to scoot to foot of bed due to being too high on bedding + 2 via chuck pad. Pt positioned with 2 pillows under R UE and PRAFO boot donned on R foot.  Outcome measure of PASS with pt scoring 9/36 this session.  Patient supine in bed at end of session with brakes locked, bed alarm set, and all needs within reach.      Therapy Documentation Precautions:  Precautions Precautions: Fall Precaution Comments: R hemiplegia, slight "pusher", SBP <140 Restrictions Weight Bearing Restrictions: No   Therapy/Group: Individual Therapy  Riah Kehoe PTA 09/14/2023, 3:19 PM

## 2023-09-15 DIAGNOSIS — I10 Essential (primary) hypertension: Secondary | ICD-10-CM | POA: Diagnosis not present

## 2023-09-15 DIAGNOSIS — I5022 Chronic systolic (congestive) heart failure: Secondary | ICD-10-CM | POA: Diagnosis not present

## 2023-09-15 DIAGNOSIS — E1151 Type 2 diabetes mellitus with diabetic peripheral angiopathy without gangrene: Secondary | ICD-10-CM | POA: Diagnosis not present

## 2023-09-15 DIAGNOSIS — I610 Nontraumatic intracerebral hemorrhage in hemisphere, subcortical: Secondary | ICD-10-CM | POA: Diagnosis not present

## 2023-09-15 LAB — GLUCOSE, CAPILLARY
Glucose-Capillary: 130 mg/dL — ABNORMAL HIGH (ref 70–99)
Glucose-Capillary: 180 mg/dL — ABNORMAL HIGH (ref 70–99)
Glucose-Capillary: 150 mg/dL — ABNORMAL HIGH (ref 70–99)
Glucose-Capillary: 164 mg/dL — ABNORMAL HIGH (ref 70–99)

## 2023-09-15 NOTE — Progress Notes (Signed)
Physical Therapy Session Note  Patient Details  Name: Kyle [name] MRN: [id number] Date of Birth: 22-Mar-1951  Today's Date: 09/15/2023 PT Individual Time: 1301-1400 PT Individual Time Calculation (min): 59 min   Short Term Goals: Week 1:  PT Short Term Goal 1 (Week 1): Pt will roll side to side w/ mod A. PT Short Term Goal 2 (Week 1): Pt will transfer sup to sit w/ mod A. PT Short Term Goal 3 (Week 1): Pt will perform squat pivot transfer w/ mod A.  Skilled Therapeutic Interventions/Progress Updates:  Patient supine in bed on entrance to room. Patient alert and agreeable to PT session.   Patient with no pain complaint at start of session.  Prior to leaving room, arm sling fashioned for R shoulder support using short stretch wrap  Therapeutic Activity: Bed Mobility: Pt performed supine > sit with overall Mod/ MaxA for R hemibody movement. ModA with significant vc/tc for technique to log roll to L side, then MaxA with max cues to push up using LUE from sidelying to seated position on EOB. Loses balance posteriorly a few times and LOB to R once. Requiring MaxA to prevent full LOB and to correct. With appropriate forward lean, pt is able to perform forward scoot toward EOB with Bil feet on floor. However, pt is impulsive initially and attempts to scoot to edge of seat with post LOB. Significant cueing for technique in forward weight shift.  Transfers: Pt performed sit<>stand and stand pivot transfers throughout session with MaxA for R hemibody motor control and overall balance. Pt continues to require sturdy hand support to L side in order to rise to stand. Is able to perform descent to sit with improved control and overall ModA. Mild bias to L side. Provided vc/ tc for proper technique.  Neuromuscular Re-ed: NMR facilitated during session with focus on standing balance, midline orientation, motor control/ muscle fiber facilitation. Pt guided in sit<>stand transfer technique with focus on  forward lean, R quad/ glute activation. Minimal activation noted in quad. Guided in minisquats and pausing of descent to sit. Unable to complete without MaxA to R side. Once attains midline orientation with use of mirror to provide visual feedback for improving proprioception, block to L knee softened but pt unable to perform hold of knee in extension. Only trace to minimal activation noted in quad during descent to sit as well as during later phase of rise to stand. Pt allowed to fial in order to promote understanding of impairments.   NMR performed for improvements in motor control and coordination, balance, sequencing, judgement, and self confidence/ efficacy in performing all aspects of mobility at highest level of independence.   Patient supine in bed at end of session with brakes locked, bed alarm set, and all needs within reach.  Therapy Documentation Precautions:  Precautions Precautions: Fall Precaution Comments: R hemiplegia, slight "pusher", SBP <140 Restrictions Weight Bearing Restrictions: No  Pain:    Therapy/Group: Individual Therapy  Loel Dubonnet PT, DPT, CSRS 09/15/2023, 6:19 PM

## 2023-09-15 NOTE — Progress Notes (Signed)
Occupational Therapy Session Note  Patient Details  Name: Kyle [name] MRN: [id number] Date of Birth: [birth year]  Today's Date: 09/15/2023 OT Individual Time: [phone]  [phone] unattended estim for 30 min)  OT Individual Time Calculation (min): 52 min    Short Term Goals: Week 1:  OT Short Term Goal 1 (Week 1): The pt will complete UB bathing and dressing with MinA consistently at 95% safe OT Short Term Goal 2 (Week 1): The pt will complete LB bathing a dressing using AE at Mod A at 95% safe OT Short Term Goal 3 (Week 1): The pt will transfer to all surfaces with Mod A using the sliding board at 95% safe OT Short Term Goal 4 (Week 1): The pt will maintain good sit balance  consistently at 95% safe OT Short Term Goal 5 (Week 1): The pt will tolerate > 45 Minutes of therapy with minimal restbreaks at 95% safe.  Skilled Therapeutic Interventions/Progress Updates:     Pt received in bed ready for therapy.  Pt using 4L of O2. Focus of therapy session on midline awareness and postural control.       ADL Retraining: -sitting on BSC pt did not have a void but engaged in bathing and dressing with max A overall and cues to fully attend to R arm.  He did elevate his shoulder when trying to wash his R axilla Transfers: -mod A supine to sit with therapist guiding legs into flexion and cued pt to use L hand to move R arm across torso.  Pt then able to push up to sit.  -max A of 2 squat pivot to R bed to w/c. Tried same transfer to [facility] over toilet but unable to get chair close enough to toilet and therapist did not have room to be in front of pt so used a stedy lift with mod A of 2 to transfer wc >< BSC. -mod A to return sit to supine  Balance: -pt initially needs mod A to sit EOB but with visual cues from mirror progress to CGA -in stedy lift, pt with R lean and needs max cues to shift weight to his L along with mod A  Neuromuscular Re-Education:  -unattended estim on RUE on deltoid and  wrist/finger extensors with intensity 28 using 10 sec on and 10 sec off -pt cued to attend to R hand and focus on "trying to open his fingers"   -pt tolerated 30 minutes of estim well  Pt continues to demonstrate decreased attention and impulsivity.  He needs CLOSE supervision/ guarding A sitting on BSC or EOB.    Pt resting in bed with all needs met. Alarm set and call light in reach.     Therapy Documentation Precautions:  Precautions Precautions: Fall Precaution Comments: R hemiplegia, slight "pusher", SBP <140 Restrictions Weight Bearing Restrictions: No   Pain: Pain Assessment Pain Scale: 0-10 Pain Score: 0-No pain ADL: ADL Equipment Provided:  (Stedyx2) Eating: Minimal assistance Where Assessed-Eating: Bed level Grooming: Minimal assistance Where Assessed-Grooming: Edge of bed Upper Body Bathing: Moderate assistance (secondary to challenges with functional sit balance and hemiparesis associate with dominate RUE) Where Assessed-Upper Body Bathing: Edge of bed Lower Body Bathing: Maximal assistance Where Assessed-Lower Body Bathing: Edge of bed Upper Body Dressing: Moderate assistance Where Assessed-Upper Body Dressing: Edge of bed Lower Body Dressing: Maximal assistance Where Assessed-Lower Body Dressing: Edge of bed Toileting: Dependent Where Assessed-Toileting: Bed level (brief) Toilet Transfer: Maximal assistance (using stedy x2 based on observation) Statistician  Method:  Antony Salmon x2 based on observation) Tub/Shower Transfer: Maximal assistance (Stedy x 2) Tub/Shower Transfer Method:  Antony Salmon) Tub/Shower Equipment: Marine scientist with wheels based on observation) Film/video editor: Maximal assistance (Using the stedy) Film/video editor Method:  Antony Salmon) Astronomer: Information systems manager with back ADL Comments: Patient inconsistent with unsupported sitting Social worker   Exercises:   Other Treatments:      Therapy/Group: Individual Therapy  Jourdain Guay 09/15/2023, 10:14 AM

## 2023-09-15 NOTE — Plan of Care (Signed)
  Problem: Consults Goal: RH STROKE PATIENT EDUCATION Description: See Patient Education module for education specifics  Outcome: Progressing   Problem: RH BOWEL ELIMINATION Goal: RH STG MANAGE BOWEL WITH ASSISTANCE Description: STG Manage Bowel with toileting Assistance. Outcome: Progressing Goal: RH STG MANAGE BOWEL W/MEDICATION W/ASSISTANCE Description: STG Manage Bowel with Medication with mod I Assistance. Outcome: Progressing   Problem: RH SAFETY Goal: RH STG ADHERE TO SAFETY PRECAUTIONS W/ASSISTANCE/DEVICE Description: STG Adhere to Safety Precautions With cues Assistance/Device. Outcome: Progressing   Problem: RH KNOWLEDGE DEFICIT Goal: RH STG INCREASE KNOWLEDGE OF DIABETES Description: Patient and wife will be able to manage prediabetes with dietary modification using educational resources independently Outcome: Progressing Goal: RH STG INCREASE KNOWLEDGE OF HYPERTENSION Description: Patient and wife will be able to manage HTN with medications and dietary modification using educational resources independently Outcome: Progressing Goal: RH STG INCREASE KNOWLEDGE OF DYSPHAGIA/FLUID INTAKE Description: Patient and wife will be able to manage dysphagia, medications and dietary modification using educational resources independently Outcome: Progressing Goal: RH STG INCREASE KNOWLEGDE OF HYPERLIPIDEMIA Description: Patient and wife will be able to manage HLD with medications and dietary modification using educational resources independently Outcome: Progressing   Problem: Education: Goal: Ability to describe self-care measures that may prevent or decrease complications (Diabetes Survival Skills Education) will improve Outcome: Progressing Goal: Individualized Educational Video(s) Outcome: Progressing   Problem: Coping: Goal: Ability to adjust to condition or change in health will improve Outcome: Progressing   Problem: Fluid Volume: Goal: Ability to maintain a balanced  intake and output will improve Outcome: Progressing   Problem: Health Behavior/Discharge Planning: Goal: Ability to identify and utilize available resources and services will improve Outcome: Progressing Goal: Ability to manage health-related needs will improve Outcome: Progressing   Problem: Metabolic: Goal: Ability to maintain appropriate glucose levels will improve Outcome: Progressing

## 2023-09-15 NOTE — Progress Notes (Signed)
PROGRESS NOTE   Subjective/Complaints:  Still requiring steady to transfer with OT ROS: Patient denies CP, SOB, Arm pain    Objective:   No results found. Recent Labs    09/13/23 0538  WBC 8.0  HGB 12.3*  HCT 37.9*  PLT 191   Recent Labs    09/13/23 1228  NA 140  K 4.2  CL 108  CO2 21*  GLUCOSE 174*  BUN 32*  CREATININE 1.67*  CALCIUM 9.0    Intake/Output Summary (Last 24 hours) at 09/15/2023 0904 Last data filed at 09/14/2023 1700 Gross per 24 hour  Intake 236 ml  Output --  Net 236 ml         Physical Exam: Vital Signs Blood pressure 130/60, pulse 69, temperature 98 F (36.7 C), resp. rate 18, height 5\' 11"  (1.803 m), weight 76.5 kg, SpO2 98%.    General: No acute distress Mood and affect are appropriate Heart: Regular rate and rhythm no rubs murmurs or extra sounds Lungs: Clear to auscultation, breathing unlabored, no rales or wheezes Abdomen: Positive bowel sounds, soft nontender to palpation, nondistended Extremities: No clubbing, cyanosis, or edema Skin: No evidence of breakdown, no evidence of rash  Neuro:  pt is alert and oriented. Follows basic commands. Speech remains very dysarthric. Right central 7. RUE 0/5..  . Decreased LT RUE. Musculoskeletal: Right arm sl tender with movement. No swelling  + impingement on RIGHT Assessment/Plan: 1. Functional deficits which require 3+ hours per day of interdisciplinary therapy in a comprehensive inpatient rehab setting. Physiatrist is providing close team supervision and 24 hour management of active medical problems listed below. Physiatrist and rehab team continue to assess barriers to discharge/monitor patient progress toward functional and medical goals  Care Tool:  Bathing    Body parts bathed by patient: Right arm, Chest, Abdomen, Front perineal area, Right upper leg, Left upper leg, Face   Body parts bathed by helper: Right lower leg,  Left lower leg, Buttocks, Front perineal area, Left arm     Bathing assist Assist Level: Maximal Assistance - Patient 24 - 49%     Upper Body Dressing/Undressing Upper body dressing   What is the patient wearing?: Pull over shirt    Upper body assist Assist Level: Minimal Assistance - Patient > 75%    Lower Body Dressing/Undressing Lower body dressing      What is the patient wearing?: Pants, Incontinence brief, Hospital gown only     Lower body assist Assist for lower body dressing: Maximal Assistance - Patient 25 - 49%     Toileting Toileting    Toileting assist Assist for toileting: Maximal Assistance - Patient 25 - 49%     Transfers Chair/bed transfer  Transfers assist     Chair/bed transfer assist level: Maximal Assistance - Patient 25 - 49%     Locomotion Ambulation   Ambulation assist   Ambulation activity did not occur: Safety/medical concerns          Walk 10 feet activity   Assist  Walk 10 feet activity did not occur: Safety/medical concerns        Walk 50 feet activity   Assist Walk 50 feet with  2 turns activity did not occur: Safety/medical concerns         Walk 150 feet activity   Assist Walk 150 feet activity did not occur: Safety/medical concerns         Walk 10 feet on uneven surface  activity   Assist Walk 10 feet on uneven surfaces activity did not occur: Safety/medical concerns         Wheelchair     Assist Is the patient using a wheelchair?: Yes Type of Wheelchair: Manual    Wheelchair assist level: Dependent - Patient 0%      Wheelchair 50 feet with 2 turns activity    Assist        Assist Level: Dependent - Patient 0%   Wheelchair 150 feet activity     Assist      Assist Level: Dependent - Patient 0%   Blood pressure 130/60, pulse 69, temperature 98 F (36.7 C), resp. rate 18, height 5\' 11"  (1.803 m), weight 76.5 kg, SpO2 98%.  Medical Problem List and Plan: 1.  Functional deficits secondary to left basal ganglia ICH d/t hypertension/eliquis             -patient may shower             -ELOS/Goals: 12-16+ days, goals min assist with PT, OT, SLP             - resting WHO and PRAFO for right side pending  -Continue CIR therapies including PT, OT, and SLP   2.  Antithrombotics: -DVT/anticoagulation:  Pharmaceutical: Lovenox added per chat w/Dr. Roda Shutters             -antiplatelet therapy: N/A due to bleed 3. Pain Management: Tylenol prn.             -pt with dysesthesias RUE, consider gabapentin trial  -11/17 will begin low dose gabapentin 100mg  qhs 4. Mood/Behavior/Sleep: LCSW to follow for evaluation and support.              -antipsychotic agents: N/A 5. Neuropsych/cognition: This patient is capable of making decisions on his own behalf. 6. Skin/Wound Care: Routine pressure relief measures. 7. Fluids/Electrolytes/Nutrition: Monitor I/O. Check CMET in am             --added low salt restrictions. Add protein supplement for    -push po   8. T2DM insulin dependent: Hgb A1c-6.2 on 09/06/23             -diet controlled CBG (last 3)  Recent Labs    09/14/23 1638 09/14/23 2052 09/15/23 0609  GLUCAP 133* 195* 130*   Elevated yest pm monitor trend    9. PAD s/o aortobifemoral bypass w/right pseudoaneurysm: SBP goal < 140.  --Plans for repair early December by Dr. Sherral Hammers? 10. COPD w/chronic bronchitis/emphysema: currently on 6 L oxygen. Titrate to keep sats > 90% per Dr. Vassie Loll.              --on Trelegy PTA --started on Breo/Incruse today.              --pulmonary toilet. Wean down to baseline as able.  11. CAF: Monitor HR TID. On coreg. Off eliquis due to ICH- per Neuro may resume on 11/25 if stable             --Neurology recommends resuming  Eliquis in 2 weeks if neuro and BP stable.     HR controlled  Vitals:   09/14/23 2003 09/15/23 0542  BP: 122/60 130/60  Pulse: 72 69  Resp: 18 18  Temp: 99.1 F (37.3 C) 98 F (36.7 C)  SpO2: 92% 98%     12. Dilated cardiomyopathy/AS/chronic systolic CHF: Low salt diet. Check weight daily--up from 166-->173?             --CXR 11/11 w/vascular congestion-->will order follow up CXR to check for fluid overload as cause of hypoxia.  --Monitor for signs of overload. On Lasix, coreg, Imdur, hydralazine  -LVEF 55-60%   Filed Weights   09/13/23 0333 09/14/23 0451 09/15/23 0500  Weight: 71.8 kg 74.4 kg 76.5 kg   11/17 weight up sl more today--but labs stable  -labs tomorrow 13. CKD: stage IIIb?             -11/17 BUN/Cr stable at 33/1.82, 11/19 baseline creat likely in 1.6-1.8 range based on labs in system         Improving , fluid intake recorded at 11/18 but only 236 mL on 11/19, recheck BMET in am       Latest Ref Rng & Units 09/13/2023   12:28 PM 09/12/2023    5:19 AM 09/11/2023    4:46 AM  BMP  Glucose 70 - 99 mg/dL 782  956  213   BUN 8 - 23 mg/dL 32  33  33   Creatinine 0.61 - 1.24 mg/dL 0.86  5.78  4.69   Sodium 135 - 145 mmol/L 140  142  143   Potassium 3.5 - 5.1 mmol/L 4.2  3.8  4.0   Chloride 98 - 111 mmol/L 108  109  108   CO2 22 - 32 mmol/L 21  25  24    Calcium 8.9 - 10.3 mg/dL 9.0  8.8  8.8       14. Dysphagia: On D1, nectar- likely contributing to poor intake of liquids               -encourage fluids, aspiration precautions   -see above  LOS: 5 days A FACE TO FACE EVALUATION WAS PERFORMED  Erick Colace 09/15/2023, 9:04 AM

## 2023-09-15 NOTE — Progress Notes (Signed)
Physical Therapy Session Note  Patient Details  Name: Kyle [name] MRN: [id number] Date of Birth: 03-14-1951  Today's Date: 09/15/2023 PT Individual Time: 1005-1100 PT Individual Time Calculation (min): 55 min   Short Term Goals: Week 1:  PT Short Term Goal 1 (Week 1): Pt will roll side to side w/ mod A. PT Short Term Goal 2 (Week 1): Pt will transfer sup to sit w/ mod A. PT Short Term Goal 3 (Week 1): Pt will perform squat pivot transfer w/ mod A.  Skilled Therapeutic Interventions/Progress Updates: Patient supine in bed with rehab tech present on entrance to room. Patient alert and agreeable to PT session.   Patient reported no pain at beginning of PT session.  Therapeutic Activity: Bed Mobility: Pt performed supine<>sit on EOB with maxA. VC required for pt to bring R UE across torso with L UE to increase R sided awareness. Pt with maxA to advance R LE off/onto of bed with VC to bring forward to increase neuromuscular connection. Pt then cued to use L UE accordingly to elevate trunk with heavy modA. Pt required minA to maintain sitting balance due to decrease trunk control + 2. Pt with maxA to scoot to [facility] at end of session +2 via chuck and pt cued to use L UE on HOB railing to pull, and L LE to bridge. Transfers: Pt performed squat pivot tranfers EOB<>TIS throughout session with maxA. Provided VC for head-hip relationship and to anteriorly scoot (mod/maxA) with use of L UE accordingly to advance L hip forward. Pt recalled sequence of placement of B LE's from previous sessions with either hinted cues or no cues required (maxA to place R LE).  Gait Training:  Pt ambulated 32' x 2outside of main gym using L HR with PTA on R side facilitating R LE advancement. Pt with DF wrap and leg lift donned on R LE. Pt demonstrated the following gait deviations with therapist providing the described cuing and facilitation for improvement:  - MaxA to advance R LE through swing phase and stance phase (to  prevent buckling - maxA). - Decrease anterior hip translation to advance LE through swing on R with maxA required, and mod/maxA on L to acquire step through pattern - Decreased trunk control with VC to extend through hips (especially through swing phase on L LE) - VC throughout for pt to advance B LE's accordingly and to weight shift Pt had noted better upright trunk control and step through on 2nd trial (VC initially that that would be the primary focus this trial as to increase future swing phase during gait cycle).  Patient supine in bed at end of session with brakes locked, bed alarm set, R UE positioned under 2 pillows (scapula and lower arm) and all needs within reach.  Therapy Documentation Precautions:  Precautions Precautions: Fall Precaution Comments: R hemiplegia, slight "pusher", SBP <140 Restrictions Weight Bearing Restrictions: No  Therapy/Group: Individual Therapy  Budd Freiermuth PTA 09/15/2023, 12:19 PM

## 2023-09-15 NOTE — Patient Care Conference (Signed)
Inpatient RehabilitationTeam Conference and Plan of Care Update Date: 09/15/2023   Time: 10:07 AM    Patient Name: Kyle [name]      Medical Record Number: [id number]  Date of Birth: [birth year] Sex: Male         Room/Bed: 4M09C/4M09C-01 Payor Info: Payor: [employer] MEDICARE / Plan: [employer] MEDICARE HMO / Product Type: *No Product type* /    Admit Date/Time:  09/10/2023  3:04 PM  Primary Diagnosis:  ICH (intracerebral hemorrhage) [facility])  Hospital Problems: Principal Problem:   ICH (intracerebral hemorrhage) [facility])    Expected Discharge Date: Expected Discharge Date: 10/07/23  Team Members Present: Physician leading conference: Dr. Claudette Laws Social Worker Present: Lavera Guise, BSW Nurse Present: Chana Bode, RN PT Present: Ralph Leyden, PT OT Present: Primitivo Gauze, OT SLP Present: Everardo Pacific, SLP PPS Coordinator present : Fae Pippin, SLP     Current Status/Progress Goal Weekly Team Focus  Bowel/Bladder   incont of B&B   continue bladder program   assist with toileting as needed    Swallow/Nutrition/ Hydration   dys 1 and NTL- anterior spillage, diffuse lingual reisdue   sup A  implementing pharyngeal strengthening exercises, compensatory strategies    ADL's   max A overall with transfers and self care, RUE flaccid, impulsive   CGA with toilet transfers and standing during ADLS, min A shower transfers and LB dressing, bathing, and toileting   RUE  NMR, estim, balance, attention to R side, pt/fam education, ADL training    Mobility   Bed mobiity = MaxA, Transfers = overall MaxA with stationary handhold to L, ambulation = 30 foot bouts at hallway HR with MaxA for R side facilitation   CGA - may need to be downgraded prior to discharge to potentially minA.  Barriers: impulsivity, lack of awareness of functional impairments, minimal RLE activation /// Work on: R hemibody NMR, increasing motor control, potential estim use, transfers, standing/  sitting balance, overall awareness and connection between physical impairments and mobility, light family ed    Communication   50-60% intelligible in words and phrases   sup A   implementing compensatory strategies, structured tasks, and production of personal/ biographical information    Safety/Cognition/ Behavioral Observations  overall cognition appears to be intact- addressing temporal orientation   sup A   external aids.    Pain   no pain reported   remain pain free   assess pain qshift and prn    Skin   skin intact   skin remain intact  assess skin qshift and prn      Discharge Planning:  Discharging home with appuse and PRN fromm children on weekends. 2nd level apartment 13 steps to enter.   Team Discussion: Patient  post ICH with dysphagia, poor po intake postural control issues, impulsivity and unable to connect impairment with function.  Patient on target to meet rehab goals: yes, currently needs max assist using a stedy. Requires max assist for self care. Goals for discharge set for min assist overall.  *See Care Plan and progress notes for long and short-term goals.   Revisions to Treatment Plan:  Pharyngeal exercises using external aides   Teaching Needs: Safety, medications, dietary modification, transfers, toileting, etc.   Current Barriers to Discharge: Decreased caregiver support, Home enviroment access/layout, and Nutritional means  Possible Resolutions to Barriers: Family education HH follow up services DME: [facility] and [facility]     Medical Summary Current Status: Severe right-sided weakness and dysarthria.  Poor sitting balance, labile  hypertension  Barriers to Discharge: Medical stability;Uncontrolled Hypertension   Possible Resolutions to [employer] Focus: Continue medication management, will need wheelchair sitting evaluation and possible custom chair due to truncal weakness.  Monitor for seizures   Continued Need for Acute Rehabilitation  Level of Care: The patient requires daily medical management by a physician with specialized training in physical medicine and rehabilitation for the following reasons: Direction of a multidisciplinary physical rehabilitation program to maximize functional independence : Yes Medical management of patient stability for increased activity during participation in an intensive rehabilitation regime.: Yes Analysis of laboratory values and/or radiology reports with any subsequent need for medication adjustment and/or medical intervention. : Yes   I attest that I was present, lead the team conference, and concur with the assessment and plan of the team.   Chana Bode B 09/15/2023, 4:16 PM

## 2023-09-15 NOTE — Progress Notes (Signed)
Patient ID: Kyle [name], male   DOB: 09-Jul-1951, 72 y.o.   MRN: [id number]  Team Conference Report to Patient/Family  Team Conference discussion was reviewed with the patient and caregiver, including goals, any changes in plan of care and target discharge date.  Patient and caregiver express understanding and are in agreement.  The patient has a target discharge date of 10/07/23.  1x attempt: Patient asleep SW will continue to follow up.   2x attempt: Patient receiving care  SW called spouse, [name] to provide team conference updates and discuss discharge.  Spouse expressed she was unable to make it to the hospital today. Sw discussed the current discharge date and informed spouse that the team will continue to monitor progress weekly. No additional questions or concerns.  Andria Rhein 09/15/2023, 1:58 PM

## 2023-09-15 NOTE — Progress Notes (Signed)
Speech Language Pathology Daily Session Note  Patient Details  Name: Kyle [name] MRN: [id number] Date of Birth: 07/11/1951  Today's Date: 09/15/2023 SLP Individual Time: 1448-1530 SLP Individual Time Calculation (min): 42 min  Short Term Goals: Week 1: SLP Short Term Goal 1 (Week 1): Patient will utilize swallowing strategies during consumpution of D1/NTL diet to reduce s/sx of aspiration with mod multimodal A SLP Short Term Goal 2 (Week 1): Patient will participate in dysphagia treatment to demonstrate a functional change in oropharyngeal swallow given modA multimodal SLP Short Term Goal 3 (Week 1): Patient will increase speech intelligibility to 60% at the word level given mod multimodal A SLP Short Term Goal 4 (Week 1): Patient will demonstrate orientation to time given min multimodal A  Skilled Therapeutic Interventions: Skilled therapy session focused on communication and dysphagia goals. SLP facilitated session by providing thorough oral care and offering sips of NTL. After sips of NTL, SLP observed nurse trial whole pills in puree. Patient requested extra spoonful of puree, however demonstrated adequate oral clearance. Patient with no s/sx of aspiration throughout. Recommend continuation of current diet with medications whole in puree and full supervision. Patient should continue use of standardized swallowing precautions and oral care after PO to clear oral cavity from remaining residuals. SLP addressed communication through review of speech strategies. Patient recalled need to "speak slowly," "take a break," and "over articulate" independently. Patient approximately 70% intelligible at the word level. Patient independently oriented to self, situation, location, year and day of the week. SLP provided patient with external aid to recall date. Patient left in bed with alarm set and call bell in reach. Continue POC.    Pain Pain Assessment Pain Scale: 0-10 Pain Score: 0-No  pain  Therapy/Group: Individual Therapy  Ian Castagna M.A., CF-SLP 09/15/2023, 12:31 PM

## 2023-09-16 DIAGNOSIS — I10 Essential (primary) hypertension: Secondary | ICD-10-CM | POA: Diagnosis not present

## 2023-09-16 DIAGNOSIS — I5022 Chronic systolic (congestive) heart failure: Secondary | ICD-10-CM | POA: Diagnosis not present

## 2023-09-16 DIAGNOSIS — E1151 Type 2 diabetes mellitus with diabetic peripheral angiopathy without gangrene: Secondary | ICD-10-CM | POA: Diagnosis not present

## 2023-09-16 DIAGNOSIS — I610 Nontraumatic intracerebral hemorrhage in hemisphere, subcortical: Secondary | ICD-10-CM | POA: Diagnosis not present

## 2023-09-16 LAB — BASIC METABOLIC PANEL
Anion gap: 9 (ref 5–15)
BUN: 36 mg/dL — ABNORMAL HIGH (ref 8–23)
CO2: 25 mmol/L (ref 22–32)
Calcium: 8.8 mg/dL — ABNORMAL LOW (ref 8.9–10.3)
Chloride: 109 mmol/L (ref 98–111)
Creatinine, Ser: 2.09 mg/dL — ABNORMAL HIGH (ref 0.61–1.24)
GFR, Estimated: 33 mL/min — ABNORMAL LOW (ref >60)
Glucose, Bld: 136 mg/dL — ABNORMAL HIGH (ref 70–99)
Potassium: 3.9 mmol/L (ref 3.5–5.1)
Sodium: 143 mmol/L (ref 135–145)

## 2023-09-16 LAB — GLUCOSE, CAPILLARY: Glucose-Capillary: 130 mg/dL — ABNORMAL HIGH (ref 70–99)

## 2023-09-16 NOTE — Progress Notes (Signed)
Speech Language Pathology Daily Session Note  Patient Details  Name: Kyle [name] MRN: [id number] Date of Birth: [birth year]  Today's Date: 09/16/2023 SLP Individual Time: 0800-0900 SLP Individual Time Calculation (min): 60 min  Short Term Goals: Week 1: SLP Short Term Goal 1 (Week 1): Patient will utilize swallowing strategies during consumpution of D1/NTL diet to reduce s/sx of aspiration with mod multimodal A SLP Short Term Goal 2 (Week 1): Patient will participate in dysphagia treatment to demonstrate a functional change in oropharyngeal swallow given modA multimodal SLP Short Term Goal 3 (Week 1): Patient will increase speech intelligibility to 60% at the word level given mod multimodal A SLP Short Term Goal 4 (Week 1): Patient will demonstrate orientation to time given min multimodal A  Skilled Therapeutic Interventions:  Skilled therapy session focused on dysphagia, cognition and communication goals. Patient oriented to self, situation, place, month, year, and DOW independently however required external aid for date. SLP completed oral care using suction toothbrush. SLP offered D1/NTL. Patient with no s/sx of aspiration until immediate explosive cough during final bite of D1 textures, possibly indicative of bolus misdirection. Patient with diffuse oral residuals, however cleared through double swallows and oral care at the end of PO trials. Patient with mild anterior spillage on R with cup sips, therefore recommend use of straw on L side to aid in complete labial seal.  Per FEES results, continue recommendation of D1/NTL with plan to complete MBS before d/c to re-evaluation oropharyngeal swallow function. Patient should continue to have full supervision during meals and follow standardized swallowing precautions (small bites/sips, slow rate, sit upright) as well as lingual/digital sweep and oral care after PO to clear remaining residuals. SLP addressed speech intelligibility through review of  speech strategies and providing minA during use. Patient 80% intelligible during 2-3 syllable words and ~60% intellibile during 4 syllable words. SLP continued to challenge patient by encouraging him to create sentences with provided vocabulary words. Patient benefited from minA to complete this activity, however then began to report fatigue. Patient left in bed with alarm set and call bell in reach. Continue POC.  Pain Pain Assessment Pain Scale: 0-10 Pain Score: 0-No pain  Therapy/Group: Individual Therapy  Niccolas Loeper M.A., CF-SLP 09/16/2023, 12:19 PM

## 2023-09-16 NOTE — Progress Notes (Signed)
Physical Therapy Session Note  Patient Details  Name: Kyle [name] MRN: [id number] Date of Birth: November 10, 1950  Today's Date: 09/16/2023 PT Individual Time: 1300-1340 PT Individual Time Calculation (min): 40 min   Short Term Goals: Week 1:  PT Short Term Goal 1 (Week 1): Pt will roll side to side w/ mod A. PT Short Term Goal 2 (Week 1): Pt will transfer sup to sit w/ mod A. PT Short Term Goal 3 (Week 1): Pt will perform squat pivot transfer w/ mod A.  Skilled Therapeutic Interventions/Progress Updates:    Pt presents in bed and reports being very fatigued from therapies today. Still agreeable to session though decreased activity tolerance noted. Max assist for bed mobility with verbal and tactile cues for technique. Min to mod/max assist sitting balance EOB with R and posterior LOBs occurring. Focused on NMR while seated EOB to address functional dynamic sitting balance, reorientation to midline, and trunk control - at times supervision/CGA and when LOB occurred required mod/max assist to correct. Pt performed oral care with suction toothbrush for oral hygiene while seated EOB. Proppped on R elbow for WB through RUE for feedback. Scooting on EOB to the L with focus on squat- scoot technique and pt able to recall head hips relationship requiring max physical assist x 5. Returned to supine with max assist with focus on technique and cues as described above.Pt requesting drink (nectar thick) with cues for safe swallowing technique and extra time in upright position. All needs in reach.    Therapy Documentation Precautions:  Precautions Precautions: Fall Precaution Comments: R hemiplegia, slight "pusher", SBP <140 Restrictions Weight Bearing Restrictions: No GTherapy Vitals Temp: 97.8 F (36.6 C) Pulse Rate: 71 Resp: 18 BP: (!) 154/96 Patient Position (if appropriate): Sitting Oxygen Therapy SpO2: 97 % O2 Device: Nasal Cannula O2 Flow Rate (L/min): 4 L/min Pain: Pain Assessment Pain  Scale: 0-10 Pain Score: 0-No pain    Therapy/Group: Individual Therapy  Karolee Stamps Darrol Poke, PT, DPT, CBIS  09/16/2023, 1:45 PM

## 2023-09-16 NOTE — Progress Notes (Signed)
Occupational Therapy Session Note  Patient Details  Name: Kyle [name] MRN: [id number] Date of Birth: 05-21-1951  Today's Date: 09/16/2023 OT Individual Time: 7829-5621 OT Individual Time Calculation (min): 67 min    Short Term Goals: Week 1:  OT Short Term Goal 1 (Week 1): The pt will complete UB bathing and dressing with MinA consistently at 95% safe OT Short Term Goal 2 (Week 1): The pt will complete LB bathing a dressing using AE at Mod A at 95% safe OT Short Term Goal 3 (Week 1): The pt will transfer to all surfaces with Mod A using the sliding board at 95% safe OT Short Term Goal 4 (Week 1): The pt will maintain good sit balance  consistently at 95% safe OT Short Term Goal 5 (Week 1): The pt will tolerate > 45 Minutes of therapy with minimal restbreaks at 95% safe.  Skilled Therapeutic Interventions/Progress Updates:     Pt received in bed ready for therapy.  Focus of therapy session on postural control and awareness, following 1 step directions and attending to task.  His wife [place] [employer] arrived during session.      ADL Retraining: -engaged in donning pants EOB with total A as pt needed max A to support balance as  pt "flinging" himself side to side. With MAX cues to focus and concentrate pt was then able to sit with close CGA.   -attempted standing EOB 5x but pt actually pushing down resisting the Assist to stand -had to use the stedy lift to stand with max A of 2 with pt pushing to the R and max cues to lean left and look in mirror for visual feedback. Pt was incontinent in brief (had offered several times before to toilet him),  removed brief and had pt cleanse himself in perched position of the stedy,  total A with new brief and pulling pants over hips  -transferred to wc  but in stedy pt became tired and "flung" his head forward almost hitting his head but I stopped him guarding his head. Once in w/c pt positioned to engaged in UB doffing and donning of shirt with max A and cues as  pt moving impulsively    Neuromuscular Re-Education:  -using mirror for visual feedback and tactile cues worked on midline posture in sitting and perched in stedy and in standing   Pt resting in tilt in space w/c with all needs met. Belt alarm set.  Wife in room with pt.    Therapy Documentation Precautions:  Precautions Precautions: Fall Precaution Comments: R hemiplegia, slight "pusher", SBP <140 Restrictions Weight Bearing Restrictions: No    Pain: Pain Assessment Pain Scale: 0-10 Pain Score: 0-No pain ADL: ADL Equipment Provided:  (Stedyx2) Eating: Minimal assistance Where Assessed-Eating: Bed level Grooming: Minimal assistance Where Assessed-Grooming: Edge of bed Upper Body Bathing: Moderate assistance (secondary to challenges with functional sit balance and hemiparesis associate with dominate RUE) Where Assessed-Upper Body Bathing: Edge of bed Lower Body Bathing: Maximal assistance Where Assessed-Lower Body Bathing: Edge of bed Upper Body Dressing: Moderate assistance Where Assessed-Upper Body Dressing: Edge of bed Lower Body Dressing: Maximal assistance Where Assessed-Lower Body Dressing: Edge of bed Toileting: Dependent Where Assessed-Toileting: Bed level (brief) Toilet Transfer: Maximal assistance (using stedy x2 based on observation) Toilet Transfer Method:  Antony Salmon x2 based on observation) Tub/Shower Transfer: Maximal assistance (Stedy x 2) Tub/Shower Transfer Method:  Antony Salmon) Tub/Shower Equipment: Marine scientist with wheels based on observation) Film/video editor: Maximal assistance (Using the stedy)  Walk-In Engineer, structural Method:  Antony Salmon) Walk-In Shower Equipment: Information systems manager with back ADL Comments: Patient inconsistent with unsupported sitting     Therapy/Group: Individual Therapy  Nilaya Bouie 09/16/2023, 12:23 PM

## 2023-09-16 NOTE — Plan of Care (Signed)
  Problem: Consults Goal: RH STROKE PATIENT EDUCATION Description: See Patient Education module for education specifics  Outcome: Progressing   Problem: RH BOWEL ELIMINATION Goal: RH STG MANAGE BOWEL WITH ASSISTANCE Description: STG Manage Bowel with toileting Assistance. Outcome: Progressing Goal: RH STG MANAGE BOWEL W/MEDICATION W/ASSISTANCE Description: STG Manage Bowel with Medication with mod I Assistance. Outcome: Progressing   Problem: RH SAFETY Goal: RH STG ADHERE TO SAFETY PRECAUTIONS W/ASSISTANCE/DEVICE Description: STG Adhere to Safety Precautions With cues Assistance/Device. Outcome: Progressing   Problem: RH KNOWLEDGE DEFICIT Goal: RH STG INCREASE KNOWLEDGE OF DIABETES Description: Patient and wife will be able to manage prediabetes with dietary modification using educational resources independently Outcome: Progressing Goal: RH STG INCREASE KNOWLEDGE OF HYPERTENSION Description: Patient and wife will be able to manage HTN with medications and dietary modification using educational resources independently Outcome: Progressing Goal: RH STG INCREASE KNOWLEDGE OF DYSPHAGIA/FLUID INTAKE Description: Patient and wife will be able to manage dysphagia, medications and dietary modification using educational resources independently Outcome: Progressing Goal: RH STG INCREASE KNOWLEGDE OF HYPERLIPIDEMIA Description: Patient and wife will be able to manage HLD with medications and dietary modification using educational resources independently Outcome: Progressing   Problem: Education: Goal: Ability to describe self-care measures that may prevent or decrease complications (Diabetes Survival Skills Education) will improve Outcome: Progressing Goal: Individualized Educational Video(s) Outcome: Progressing   Problem: Coping: Goal: Ability to adjust to condition or change in health will improve Outcome: Progressing   Problem: Fluid Volume: Goal: Ability to maintain a balanced  intake and output will improve Outcome: Progressing   Problem: Health Behavior/Discharge Planning: Goal: Ability to identify and utilize available resources and services will improve Outcome: Progressing Goal: Ability to manage health-related needs will improve Outcome: Progressing   Problem: Metabolic: Goal: Ability to maintain appropriate glucose levels will improve Outcome: Progressing

## 2023-09-16 NOTE — Progress Notes (Signed)
Physical Therapy Session Note  Patient Details  Name: Kyle [name] MRN: [id number] Date of Birth: 1951-09-26  Today's Date: 09/16/2023 PT Individual Time: 1108-1206 PT Individual Time Calculation (min): 58 min   Short Term Goals: Week 1:  PT Short Term Goal 1 (Week 1): Pt will roll side to side w/ mod A. PT Short Term Goal 2 (Week 1): Pt will transfer sup to sit w/ mod A. PT Short Term Goal 3 (Week 1): Pt will perform squat pivot transfer w/ mod A.  Skilled Therapeutic Interventions/Progress Updates: Patient sitting in TIS with wife present on entrance to room. Patient alert and agreeable to PT session.   Patient reported no pain but did endorse being uncomfortable in TIS. Pt and pt wife educated that today's focus was to get out of TIS and onto hi/low mat to participate in e-stim on R quadriceps musculature in order to improve neuromuscular connection with pt and pt wife understanding. Pt demonstrated sensation of light touch intact on R LE quadriceps to assess safety of e-stim. Upon further examination after pt was dependently transported to main gym in TIS (PTA also managing O2 tank on 4L) it was determined that B leg rests were too short. PTA applied new leg rests that fit pt's leg length comfortably with pt reporting that it felt better, but still reported being uncomfortable sitting in seat. PTA looked for new TIS that had lateral trunk supports to maintain pt in neutral hip/trunk postioning as pt has decreased hip movement on R to adjust self accordingly. PTA could not find adequate TIS to adjust, so pillows donned on either side of pt's side of trunk (pt performed lateral/posterior scoot in TIS with modA and VC to use L UE and B LE's to power self back into seat towards center). Pt reported feeling more comfortable in chair but wanted to get back into bed due to being in TIS for at least 2 hours (per wife and rehab tech report). Pt transported back to room for end of session and performed  anterior scoot to edge of TIS with mod/maxA and multimodal cues to advance L hip - maxA on R). Pt then squat pivoted to EOB with heavy mod/maxA due to fatigue (pt recalled placement of L LE with hint cues with PTA placing R LE in correct spot as well as min cues to recall head/hip relationship), and maxA to transition from sitting to supine in bed. Pt scooted to [facility] with VC to use L UE on R HOB railing to pull, and to flex L knee to bridge self towards HOB + 2 via chuck with modA. Wife and pt appreciated time taken to adjust pt in TIS so that pt can comfortably tolerate sitting in TIS between therapy sessions.   Patient supine in bed at end of session with brakes locked, wife present, bed alarm set, and all needs within reach.      Therapy Documentation Precautions:  Precautions Precautions: Fall Precaution Comments: R hemiplegia, slight "pusher", SBP <140 Restrictions Weight Bearing Restrictions: No   Therapy/Group: Individual Therapy  Micaella Gitto PTA 09/16/2023, 12:35 PM

## 2023-09-16 NOTE — Progress Notes (Signed)
PROGRESS NOTE   Subjective/Complaints:  Discussed elevated creat and plans to hold lasix and inc po fluid intake  ROS: Patient denies CP, SOB, Arm pain    Objective:   No results found. No results for input(s): "WBC", "HGB", "HCT", "PLT" in the last 72 hours.  Recent Labs    09/13/23 1228 09/16/23 0521  NA 140 143  K 4.2 3.9  CL 108 109  CO2 21* 25  GLUCOSE 174* 136*  BUN 32* 36*  CREATININE 1.67* 2.09*  CALCIUM 9.0 8.8*    Intake/Output Summary (Last 24 hours) at 09/16/2023 0751 Last data filed at 09/15/2023 1827 Gross per 24 hour  Intake 300 ml  Output --  Net 300 ml         Physical Exam: Vital Signs Blood pressure 120/62, pulse 65, temperature 98.1 F (36.7 C), resp. rate 18, height 5\' 11"  (1.803 m), weight 76.6 kg, SpO2 96%.    General: No acute distress Mood and affect are appropriate Heart: Regular rate and rhythm no rubs murmurs or extra sounds Lungs: Clear to auscultation, breathing unlabored, no rales or wheezes Abdomen: Positive bowel sounds, soft nontender to palpation, nondistended Extremities: No clubbing, cyanosis, or edema Skin: No evidence of breakdown, no evidence of rash  Neuro:  pt is alert and oriented. Follows basic commands. Speech remains very dysarthric. Right central 7. RUE 0/5..  . Decreased LT RUE. Musculoskeletal: Right arm sl tender with movement. No swelling  + impingement on RIGHT Assessment/Plan: 1. Functional deficits which require 3+ hours per day of interdisciplinary therapy in a comprehensive inpatient rehab setting. Physiatrist is providing close team supervision and 24 hour management of active medical problems listed below. Physiatrist and rehab team continue to assess barriers to discharge/monitor patient progress toward functional and medical goals  Care Tool:  Bathing    Body parts bathed by patient: Right arm, Chest, Abdomen, Front perineal area, Right  upper leg, Left upper leg, Face   Body parts bathed by helper: Right lower leg, Left lower leg, Buttocks, Front perineal area, Left arm     Bathing assist Assist Level: Maximal Assistance - Patient 24 - 49%     Upper Body Dressing/Undressing Upper body dressing   What is the patient wearing?: Pull over shirt    Upper body assist Assist Level: Minimal Assistance - Patient > 75%    Lower Body Dressing/Undressing Lower body dressing      What is the patient wearing?: Pants, Incontinence brief, Hospital gown only     Lower body assist Assist for lower body dressing: Maximal Assistance - Patient 25 - 49%     Toileting Toileting    Toileting assist Assist for toileting: Maximal Assistance - Patient 25 - 49%     Transfers Chair/bed transfer  Transfers assist     Chair/bed transfer assist level: Maximal Assistance - Patient 25 - 49%     Locomotion Ambulation   Ambulation assist   Ambulation activity did not occur: Safety/medical concerns          Walk 10 feet activity   Assist  Walk 10 feet activity did not occur: Safety/medical concerns        Walk  50 feet activity   Assist Walk 50 feet with 2 turns activity did not occur: Safety/medical concerns         Walk 150 feet activity   Assist Walk 150 feet activity did not occur: Safety/medical concerns         Walk 10 feet on uneven surface  activity   Assist Walk 10 feet on uneven surfaces activity did not occur: Safety/medical concerns         Wheelchair     Assist Is the patient using a wheelchair?: Yes Type of Wheelchair: Manual    Wheelchair assist level: Dependent - Patient 0%      Wheelchair 50 feet with 2 turns activity    Assist        Assist Level: Dependent - Patient 0%   Wheelchair 150 feet activity     Assist      Assist Level: Dependent - Patient 0%   Blood pressure 120/62, pulse 65, temperature 98.1 F (36.7 C), resp. rate 18, height 5\' 11"   (1.803 m), weight 76.6 kg, SpO2 96%.  Medical Problem List and Plan: 1. Functional deficits secondary to left basal ganglia ICH d/t hypertension/eliquis             -patient may shower             -ELOS/Goals: 12-16+ days, goals min assist with PT, OT, SLP             - resting WHO and PRAFO for right side pending  -Continue CIR therapies including PT, OT, and SLP   2.  Antithrombotics: -DVT/anticoagulation:  Pharmaceutical: Lovenox added per chat w/Dr. Roda Shutters             -antiplatelet therapy: N/A due to bleed 3. Pain Management: Tylenol prn.             -pt with dysesthesias RUE, consider gabapentin trial  -11/17 will begin low dose gabapentin 100mg  qhs 4. Mood/Behavior/Sleep: LCSW to follow for evaluation and support.              -antipsychotic agents: N/A 5. Neuropsych/cognition: This patient is capable of making decisions on his own behalf. 6. Skin/Wound Care: Routine pressure relief measures. 7. Fluids/Electrolytes/Nutrition: Monitor I/O. Check CMET in am, poor intake will need IVF              --added low salt restrictions. Add protein supplement for    -push po   8. T2DM insulin dependent: Hgb A1c-6.2 on 09/06/23             -diet controlled CBG (last 3)  Recent Labs    09/15/23 1652 09/15/23 2108 09/16/23 0552  GLUCAP 150* 164* 130*  Controlled no meds or modified diet may d/c cbg   9. PAD s/o aortobifemoral bypass w/right pseudoaneurysm: SBP goal < 140.  --Plans for repair early December by Dr. Sherral Hammers? 10. COPD w/chronic bronchitis/emphysema: currently on 6 L oxygen. Titrate to keep sats > 90% per Dr. Vassie Loll.              --on Trelegy PTA --started on Breo/Incruse today.              --pulmonary toilet. Wean down to baseline as able.  11. CAF: Monitor HR TID. On coreg. Off eliquis due to ICH- per Neuro may resume on 11/25 if stable             --Neurology recommends resuming  Eliquis in 2 weeks if neuro and BP stable.  HR controlled  Vitals:   09/15/23 1946  09/16/23 0505  BP: (!) 128/57 120/62  Pulse: 81 65  Resp: 16 18  Temp: 100.2 F (37.9 C) 98.1 F (36.7 C)  SpO2: (!) 79% 96%    12. Dilated cardiomyopathy/AS/chronic systolic CHF: Low salt diet. Check weight daily--up from 166-->173?             --CXR 11/11 w/vascular congestion-->will order follow up CXR to check for fluid overload as cause of hypoxia.  --Monitor for signs of overload. On Lasix, coreg, Imdur, hydralazine  -LVEF 55-60%   Filed Weights   09/15/23 0500 09/15/23 1100 09/16/23 0500  Weight: 76.5 kg 74.8 kg 76.6 kg  Weights stable , will d/c lasix due to elevated BUN/Creat  13. CKD: stage IIIb?             -11/17 BUN/Cr stable at 33/1.82, 11/19 baseline creat likely in 1.6-1.8 range based on labs in system         Improving , fluid intake recorded at 11/18 but only 236 mL on 11/19, Creat up, hold lasix starting 11/21      Latest Ref Rng & Units 09/16/2023    5:21 AM 09/13/2023   12:28 PM 09/12/2023    5:19 AM  BMP  Glucose 70 - 99 mg/dL 284  132  440   BUN 8 - 23 mg/dL 36  32  33   Creatinine 0.61 - 1.24 mg/dL 1.02  7.25  3.66   Sodium 135 - 145 mmol/L 143  140  142   Potassium 3.5 - 5.1 mmol/L 3.9  4.2  3.8   Chloride 98 - 111 mmol/L 109  108  109   CO2 22 - 32 mmol/L 25  21  25    Calcium 8.9 - 10.3 mg/dL 8.8  9.0  8.8       14. Dysphagia: On D1, nectar- likely contributing to poor intake of liquids               -encourage fluids, aspiration precautions   -see above  LOS: 6 days A FACE TO FACE EVALUATION WAS PERFORMED  Erick Colace 09/16/2023, 7:51 AM

## 2023-09-17 DIAGNOSIS — I482 Chronic atrial fibrillation, unspecified: Secondary | ICD-10-CM | POA: Diagnosis not present

## 2023-09-17 DIAGNOSIS — I5022 Chronic systolic (congestive) heart failure: Secondary | ICD-10-CM | POA: Diagnosis not present

## 2023-09-17 DIAGNOSIS — I610 Nontraumatic intracerebral hemorrhage in hemisphere, subcortical: Secondary | ICD-10-CM | POA: Diagnosis not present

## 2023-09-17 DIAGNOSIS — R208 Other disturbances of skin sensation: Secondary | ICD-10-CM | POA: Diagnosis not present

## 2023-09-17 NOTE — Progress Notes (Signed)
Physical Therapy Session Note  Patient Details  Name: Kyle [name] MRN: [id number] Date of Birth: 10-22-1951  Today's Date: 09/17/2023 PT Individual Time: 1510-1609 PT Individual Time Calculation (min): 59 min   Short Term Goals: Week 1:  PT Short Term Goal 1 (Week 1): Pt will roll side to side w/ mod A. PT Short Term Goal 2 (Week 1): Pt will transfer sup to sit w/ mod A. PT Short Term Goal 3 (Week 1): Pt will perform squat pivot transfer w/ mod A. Week 2:     Skilled Therapeutic Interventions/Progress Updates:  Patient *** on entrance to room. Patient alert and agreeable to PT session.   Patient with no pain complaint at start of session.  Therapeutic Activity: Bed Mobility: Pt performed supine <> sit with ***. VC/ tc required for ***. Transfers: Pt performed sit<>stand and stand pivot transfers throughout session with ***. Provided verbal cues for***.  Gait Training:  Pt ambulated *** ft using *** with ***. Demonstrated ***. Provided vc/ tc for ***.  Wheelchair Mobility:  Pt propelled wheelchair *** feet with ***. Provided vc/ tc for ***.  Neuromuscular Re-ed: NMR facilitated during session with focus on***. Pt guided in ***. NMR performed for improvements in motor control and coordination, balance, sequencing, judgement, and self confidence/ efficacy in performing all aspects of mobility at highest level of independence.   Therapeutic Exercise: Pt performed the following exercises with vc/ tc for proper technique. ***  Patient *** at end of session with brakes locked, *** alarm set, and all needs within reach.   Therapy Documentation Precautions:  Precautions Precautions: Fall Precaution Comments: R hemiplegia, slight "pusher", SBP <140 Restrictions Weight Bearing Restrictions: No General:   Vital Signs:   Pain:    Therapy/Group: Individual Therapy  Loel Dubonnet PT, DPT, CSRS 09/17/2023, 6:18 PM

## 2023-09-17 NOTE — Progress Notes (Signed)
Occupational Therapy Weekly Progress Note  Patient Details  Name: Kyle [name] MRN: [id number] Date of Birth: 1951-08-03  Beginning of progress report period: September 11, 2023 End of progress report period: September 17, 2023  Today's Date: 09/17/2023 OT Individual Time: 8413-2440 OT Individual Time Calculation (min): 90 min    Patient has met 0 of 5 short term goals.  His STGs were set at a fairly high level. He is making slow and gradual progress but continues to need max to total A with self care and sit pivot transfers.  His sitting balance fluctuates.  He tends to be able to hold static sitting with improved control first thing in the morning, but declines quickly due to fatigue then needing max A with balance.  He is on 4.5 L at rest, and needs 5 L with activity.    Patient continues to demonstrate the following deficits: decreased cardiorespiratoy endurance and decreased oxygen support, abnormal tone and motor apraxia, decreased attention to right, decreased attention, decreased awareness, decreased problem solving, and decreased safety awareness, and decreased sitting balance, decreased standing balance, decreased postural control, hemiplegia, and decreased balance strategies and therefore will continue to benefit from skilled OT intervention to enhance overall performance with BADL.  Patient progressing toward long term goals..  Continue plan of care.  OT Short Term Goals Week 1:  OT Short Term Goal 1 (Week 1): The pt will complete UB bathing and dressing with MinA consistently at 95% safe OT Short Term Goal 1 - Progress (Week 1): Progressing toward goal OT Short Term Goal 2 (Week 1): The pt will complete LB bathing a dressing using AE at Mod A at 95% safe OT Short Term Goal 2 - Progress (Week 1): Progressing toward goal OT Short Term Goal 3 (Week 1): The pt will transfer to all surfaces with Mod A using the sliding board at 95% safe OT Short Term Goal 3 - Progress (Week 1):  Progressing toward goal OT Short Term Goal 4 (Week 1): The pt will maintain good sit balance  consistently at 95% safe OT Short Term Goal 4 - Progress (Week 1): Progressing toward goal OT Short Term Goal 5 (Week 1): The pt will tolerate > 45 Minutes of therapy with minimal restbreaks at 95% safe. OT Short Term Goal 5 - Progress (Week 1): Progressing toward goal Week 2:  OT Short Term Goal 1 (Week 2): pt will demonstrate improved sitting balance to sit upright without back support consistently for at least 10 minutes at a time to enable him to engage in sefl care tasks at EOB. OT Short Term Goal 2 (Week 2): Pt will don shirt with mod A demonstrating improved attention and awareness of RUE. OT Short Term Goal 3 (Week 2): Pt will complete squat pivots w/c >< bed or mat to L and to R consistently with max A of 1 to progress to completing toilet transfers in a safe manner (pt currently requires a stedy lift) OT Short Term Goal 4 (Week 2): Pt will demonstrate improved awareness of RUE by initiating reaching for R arm with L arm to prepare for bed mobility.  Skilled Therapeutic Interventions/Progress Updates:     Pt received in w/c ready for therapy.  Pt declined bathing. Informed NT that he may need a bath tonight.       ADL Retraining: -pt received in w/c trying to talk but his lips were sticking together and I noted there was a significant amount of sticky substance in his  mouth, on tongue, gums and lips and his dentures were sitting on sink coated with sticky substance. Pt stated it was the fixodent.  Clearly too much had been applied. Spent several minutes assisting pt cleanse his tongue, gums, lips and the dentures.  Applied fixodent and pt placed in mouth. Pt wanted me to put more but told him a thin strip should be sufficient to avoid the overflow that had just been cleaned up out of his mouth.   Transfers: -w/c to mat and back to w/c required a heavy max A of 2.  Pt cued and was given  demonstration of how to forward lean to lift hips prior to pivot but transfer requires max A even with restarting transfers 2x as pt either thrusting hips forward or trying to slide hips.  He also tends to "over respond" and tosses his body versus allowing the therapist to gently guide him  Balance: -worked on sitting on mat with rehab tech behind pt for support  -had pt work on protective responses of trying to engage his left lateral trunk muscles as he was falling to the R and to push down through his L glute onto the mat -had pt work on dynamic reach to L and then find midline and try to hold midline  -pt needing max A through the exercises as he continued to use fast impulsive exaggerated movements. It has been a challenge to have him slow down and attend to task.  -pt became very fatigued and requested to go back to bed -returned pt to room,  just after the max A of 2 squat pivot to the L, pt flung back on bed vs trying to hold his balance. Total A of 2 to get pt straightened up in bed.   Pt stated "I just can't help it" when falling back or forward in sitting.    Neuromuscular Re-Education to RUE:  -pt positioned in bed with pillow support under R arm  -applied estim to deltoid (28 intensity) and wrist/finger extensors ( 31 intensity)  for 30 minutes.  Cued pt to attend to hand and focus on opening his fingers as wide as possible.   Manual finger flexion in 10 second off phase with assisted finger extension in 10 second on phase for 10 min of 30 min session. Pt responded well, no adverse skin reactions.   Pt resting in bed with all needs met. Alarm set and call light in reach.   NT with pt assisting him with lunch. Pt pocketing food on R.  Informed her to cue him to slow down and attend to R side and assist with moving food if needed.   Therapy Documentation Precautions:  Precautions Precautions: Fall Precaution Comments: R hemiplegia, slight "pusher", SBP <140 Restrictions Weight  Bearing Restrictions: No Oxygen Therapy SpO2: 91 % O2 Device: Nasal Cannula O2 Flow Rate (L/min): 5 L/min Pain: Pain Assessment Pain Score: 0-No pain ADL: ADL Equipment Provided:  (Stedyx2) Eating: Minimal assistance Where Assessed-Eating: Bed level Grooming: Minimal assistance Where Assessed-Grooming: Edge of bed Upper Body Bathing: Moderate assistance Where Assessed-Upper Body Bathing: Edge of bed Lower Body Bathing: Maximal assistance Where Assessed-Lower Body Bathing: Edge of bed Upper Body Dressing: Moderate assistance Where Assessed-Upper Body Dressing: Edge of bed Lower Body Dressing: Maximal assistance Where Assessed-Lower Body Dressing: Edge of bed Toileting: Dependent Where Assessed-Toileting: Bed level (brief) Toilet Transfer: Maximal assistance Toilet Transfer Method:  Antony Salmon x2 based on observation) Tub/Shower Transfer: Unable to assess Tub/Shower Transfer Method:  Antony Salmon)  Tub/Shower Equipment: Marine scientist with wheels based on observation) Film/video editor: Unable to assess [employer] Method:  Antony Salmon) Astronomer: Information systems manager with back ADL Comments: Patient inconsistent with unsupported sitting    Therapy/Group: Individual Therapy  Zakya Halabi 09/17/2023, 1:05 PM

## 2023-09-17 NOTE — Progress Notes (Signed)
PROGRESS NOTE   Subjective/Complaints:  Pt in bed. Just finished with SLP. No complaints. Denies dizziness or pain  ROS: Patient denies fever, rash, sore throat, blurred vision, dizziness, nausea, vomiting, diarrhea, cough, shortness of breath or chest pain, joint or back/neck pain, headache, or mood change.   Objective:   No results found. No results for input(s): "WBC", "HGB", "HCT", "PLT" in the last 72 hours.  Recent Labs    09/16/23 0521  NA 143  K 3.9  CL 109  CO2 25  GLUCOSE 136*  BUN 36*  CREATININE 2.09*  CALCIUM 8.8*    Intake/Output Summary (Last 24 hours) at 09/17/2023 0916 Last data filed at 09/17/2023 0759 Gross per 24 hour  Intake 600 ml  Output 250 ml  Net 350 ml         Physical Exam: Vital Signs Blood pressure 125/62, pulse 69, temperature 99.3 F (37.4 C), resp. rate 18, height 5\' 11"  (1.803 m), weight 77.7 kg, SpO2 95%.    Constitutional: No distress . Vital signs reviewed. HEENT: NCAT, EOMI, oral membranes moist Neck: supple Cardiovascular: RRR without murmur. No JVD    Respiratory/Chest: CTA Bilaterally without wheezes or rales. Normal effort    GI/Abdomen: BS +, non-tender, non-distended Ext: no clubbing, cyanosis, or edema Psych: pleasant and cooperative  Skin: No evidence of breakdown, no evidence of rash Neuro:  pt is alert and oriented. Follows basic commands. Fair insight and awareness. Speech with improved dysarthria. Right central 7. RUE 0/5, RLE --trace HF/E and 0/5 KE/ADF/APF. Decreased LT RUE.  Musculoskeletal: Right arm no longer tender to touch and rom.  + impingement still on RIGHT   Assessment/Plan: 1. Functional deficits which require 3+ hours per day of interdisciplinary therapy in a comprehensive inpatient rehab setting. Physiatrist is providing close team supervision and 24 hour management of active medical problems listed below. Physiatrist and rehab team  continue to assess barriers to discharge/monitor patient progress toward functional and medical goals  Care Tool:  Bathing    Body parts bathed by patient: Right arm, Chest, Abdomen, Front perineal area, Right upper leg, Left upper leg, Face   Body parts bathed by helper: Right lower leg, Left lower leg, Buttocks, Front perineal area, Left arm     Bathing assist Assist Level: Maximal Assistance - Patient 24 - 49%     Upper Body Dressing/Undressing Upper body dressing   What is the patient wearing?: Pull over shirt    Upper body assist Assist Level: Minimal Assistance - Patient > 75%    Lower Body Dressing/Undressing Lower body dressing      What is the patient wearing?: Pants, Incontinence brief, Hospital gown only     Lower body assist Assist for lower body dressing: Maximal Assistance - Patient 25 - 49%     Toileting Toileting    Toileting assist Assist for toileting: Maximal Assistance - Patient 25 - 49%     Transfers Chair/bed transfer  Transfers assist     Chair/bed transfer assist level: Maximal Assistance - Patient 25 - 49%     Locomotion Ambulation   Ambulation assist   Ambulation activity did not occur: Safety/medical concerns  Walk 10 feet activity   Assist  Walk 10 feet activity did not occur: Safety/medical concerns        Walk 50 feet activity   Assist Walk 50 feet with 2 turns activity did not occur: Safety/medical concerns         Walk 150 feet activity   Assist Walk 150 feet activity did not occur: Safety/medical concerns         Walk 10 feet on uneven surface  activity   Assist Walk 10 feet on uneven surfaces activity did not occur: Safety/medical concerns         Wheelchair     Assist Is the patient using a wheelchair?: Yes Type of Wheelchair: Manual    Wheelchair assist level: Dependent - Patient 0%      Wheelchair 50 feet with 2 turns activity    Assist        Assist Level:  Dependent - Patient 0%   Wheelchair 150 feet activity     Assist      Assist Level: Dependent - Patient 0%   Blood pressure 125/62, pulse 69, temperature 99.3 F (37.4 C), resp. rate 18, height 5\' 11"  (1.803 m), weight 77.7 kg, SpO2 95%.  Medical Problem List and Plan: 1. Functional deficits secondary to left basal ganglia ICH d/t hypertension/eliquis             -patient may shower             -ELOS/Goals: 12-16+ days, goals min assist with PT, OT, SLP             - resting WHO and PRAFO for right side pending  -Continue CIR therapies including PT, OT, and SLP    2.  Antithrombotics: -DVT/anticoagulation:  Pharmaceutical: Lovenox added per chat w/Dr. Roda Shutters             -antiplatelet therapy: N/A due to bleed 3. Pain Management: Tylenol prn.             -continue low dose gabapentin 100mg  at bedtime--dysesthetic pain improved 4. Mood/Behavior/Sleep: LCSW to follow for evaluation and support.              -antipsychotic agents: N/A 5. Neuropsych/cognition: This patient is capable of making decisions on his own behalf. 6. Skin/Wound Care: Routine pressure relief measures. 7. Fluids/Electrolytes/Nutrition: Monitor I/O. Check CMET in am, poor intake will need IVF              --added low salt restrictions. Add protein supplement for    -push po   8. T2DM insulin dependent: Hgb A1c-6.2 on 09/06/23             -diet controlled CBG (last 3)  Recent Labs    09/15/23 1652 09/15/23 2108 09/16/23 0552  GLUCAP 150* 164* 130*   11//22 reasonable control   9. PAD s/o aortobifemoral bypass w/right pseudoaneurysm: SBP goal < 140.  --Plans for repair early December by Dr. Sherral Hammers? 10. COPD w/chronic bronchitis/emphysema: currently on 6 L oxygen. Titrate to keep sats > 90% per Dr. Vassie Loll.              --on Trelegy PTA --started on Breo/Incruse today.              --pulmonary toilet. Wean down to baseline as able.  11. CAF: Monitor HR TID. On coreg. Off eliquis due to ICH- per Neuro may  resume on 11/25 if stable             --  Neurology recommends resuming  Eliquis in 2 weeks if neuro and BP stable.     HR controlled  Vitals:   09/16/23 2005 09/17/23 0322  BP: (!) 135/57 125/62  Pulse: 70 69  Resp: 18 18  Temp: 98.9 F (37.2 C) 99.3 F (37.4 C)  SpO2: 96% 95%    12. Dilated cardiomyopathy/AS/chronic systolic CHF: Low salt diet. Check weight daily--up from 166-->173?             --CXR 11/11 w/vascular congestion-->will order follow up CXR to check for fluid overload as cause of hypoxia.  --Monitor for signs of overload. On Lasix, coreg, Imdur, hydralazine  -LVEF 55-60%   Filed Weights   09/15/23 1100 09/16/23 0500 09/17/23 0500  Weight: 74.8 kg 76.6 kg 77.7 kg  Weights stable , will d/c lasix due to elevated BUN/Creat  13. CKD: stage IIIb?             -11/17 BUN/Cr stable at 33/1.82, 11/19 baseline creat likely in 1.6-1.8 range based on labs in system         Improving , fluid intake recorded at 11/18 but only 236 mL on 11/19, Creat up, hold lasix starting 11/21  11/22- no labs today, +350cc yesterday--continue to hold lasix   -check bmet 11/23    Latest Ref Rng & Units 09/16/2023    5:21 AM 09/13/2023   12:28 PM 09/12/2023    5:19 AM  BMP  Glucose 70 - 99 mg/dL 696  295  284   BUN 8 - 23 mg/dL 36  32  33   Creatinine 0.61 - 1.24 mg/dL 1.32  4.40  1.02   Sodium 135 - 145 mmol/L 143  140  142   Potassium 3.5 - 5.1 mmol/L 3.9  4.2  3.8   Chloride 98 - 111 mmol/L 109  108  109   CO2 22 - 32 mmol/L 25  21  25    Calcium 8.9 - 10.3 mg/dL 8.8  9.0  8.8       14. Dysphagia: On D1, nectar- likely contributing to poor intake of liquids               -encourage fluids, aspiration precautions   -see above  LOS: 7 days A FACE TO FACE EVALUATION WAS PERFORMED  Ranelle Oyster 09/17/2023, 9:16 AM

## 2023-09-17 NOTE — Progress Notes (Signed)
Patient ID: Kyle [name], male   DOB: 07-28-1951, 72 y.o.   MRN: [id number]   BSC and TTB ordered through [employer].

## 2023-09-17 NOTE — Progress Notes (Signed)
Speech Language Pathology Weekly Progress and Session Note  Patient Details  Name: Kyle [name] MRN: [id number] Date of Birth: 1951-02-28  Beginning of progress report period: September 11, 2023 End of progress report period: September 17, 2023  Today's Date: 09/17/2023 SLP Individual Time: 6237-6283 SLP Individual Time Calculation (min): 45 min  Short Term Goals: Week 1: SLP Short Term Goal 1 (Week 1): Patient will utilize swallowing strategies during consumpution of D1/NTL diet to reduce s/sx of aspiration with mod multimodal A SLP Short Term Goal 1 - Progress (Week 1): Met SLP Short Term Goal 2 (Week 1): Patient will participate in dysphagia treatment to demonstrate a functional change in oropharyngeal swallow given modA multimodal SLP Short Term Goal 2 - Progress (Week 1): Met SLP Short Term Goal 3 (Week 1): Patient will increase speech intelligibility to 60% at the word level given mod multimodal A SLP Short Term Goal 3 - Progress (Week 1): Met SLP Short Term Goal 4 (Week 1): Patient will demonstrate orientation to time given min multimodal A SLP Short Term Goal 4 - Progress (Week 1): Met    New Short Term Goals: Week 2: SLP Short Term Goal 1 (Week 2): Patient will utilize swallowing strategies during consumpution of D1/NTL diet to reduce s/sx of aspiration with min multimodal A SLP Short Term Goal 2 (Week 2): Patient will participate in dysphagia treatment to demonstrate a functional change in oropharyngeal swallow given minA multimodal SLP Short Term Goal 3 (Week 2): Patient will increase speech intelligibility to 80% at the word level given min multimodal A SLP Short Term Goal 4 (Week 2): Patient will demonstrate orientation to time given modi multimodal A  Weekly Progress Updates: Pt has made excellent gains and has met 4 of 4 STGs this reporting period due to improved communication, dysphagia and cognition. Currently, patient continues to require min-mod A for use of speech  strategies in order to increase intelligibility at the word level to 70%.  Patient continues to consume D1/NTL diet, however with limit-no s/sx of aspiration and requiring minA to follow swallowing precautions. Patient benefits from modA to participate in dysphagia exercises. Patient continues to utilize external aid to recall date/DOW. Pt/family eduction ongoing. Pt would benefit from continued ST intervention to maximize communication, cognition and dysphagia in order to maximize functional independence at d/c.    Intensity: Minumum of 1-2 x/day, 30 to 90 minutes Frequency: 3 to 5 out of 7 days Duration/Length of Stay: 12/12 Treatment/Interventions: Cognitive remediation/compensation;Internal/external aids;Speech/Language facilitation;Cueing hierarchy;Therapeutic Activities;Functional tasks;Multimodal communication approach;Patient/family education;Therapeutic Exercise   Daily Session  Skilled Therapeutic Interventions: Skilled therapy session focused on dysphagia, cognition and communication goals. Upon entrance, patient consuming D1 textures with significant amount of R anterior spillage. SLP aided in further consumption of trials providing min-modA for patient to clear anterior spillage using complete labial seal and utilizing liquid (NTL) wash. SLP offered trials of NTL via straw and cup. Patient with no anterior spillage during trials with straw, though patient reports preference to use cup.  No s/sx of aspiration throughout. Noted continued mild R buccal residuals, though cleared via liquid wash and lingual sweep. Oral care completed at the conclusion of PO trials. Recommend continuation of current diet and full supervision level. Continue standardized swallowng precautions (small bites/sips, slow rate, sit upright) as well as liquid wash, lingual sweep and oral care before/after PO. SLP educated patient on oropharyngeal swallow exercises including the supraglottic swallow. Patient required modA  to complete x5. SLP addressed communication through review and use  of speech intelligibility strategies. Patient recalled 4/4 strategies using external aid. During session, patient 70-80% intelligible at the word level, though accuracy decreasing as syllables increase. During speech exercises, patient requiring occasional breaks likely due to reduced respiratory strength. Patient oriented to self and place independently, though required minA to orient to situation and time. Patient left in bed with alarm set and call bell in reach. Continue POC.     Pain None reported   Therapy/Group: Individual Therapy  Liev Brockbank M.A., CF-SLP 09/17/2023, 8:43 AM

## 2023-09-17 NOTE — Progress Notes (Signed)
Physical Therapy Session Note  Patient Details  Name: Kyle [name] MRN: [id number] Date of Birth: 09-17-1951  Today's Date: 09/17/2023 PT Individual Time: 0852-0920 PT Individual Time Calculation (min): 28 min   Short Term Goals: Week 1:  PT Short Term Goal 1 (Week 1): Pt will roll side to side w/ mod A. PT Short Term Goal 2 (Week 1): Pt will transfer sup to sit w/ mod A. PT Short Term Goal 3 (Week 1): Pt will perform squat pivot transfer w/ mod A.  Skilled Therapeutic Interventions/Progress Updates: Patient supine in bed with rehab tech present on entrance to room. Patient alert and agreeable to PT session.   Patient reported no pain.  Therapeutic Activity: Bed Mobility: PTA threaded pt's personal pants through B LE's with maxA up to knee. Pt then cued to use L UE to pull pants ups around waist with PTA approximating R LE at knee for pt to bridge glutes in order to donn pants around waist (able to perform). performed supine<>sit on EOB with multimodal cues to use L UE to bring R UE over torso at the elbow. Pt then cued to advance R LE off of bed with maxA (some noted engagement, but hard to assess at that time). Pt then use of L UE to push off of bed to sit with minA. Pt then sat EOB with B UE's in lap for 5 minutes to challenge static sitting balance for core engagement. Pt demonstrated progress is trunk control as pt was previously minA to avoid falling over to R or posteriorly. Pt had several moments needing to adjust in seated position with trunk moving slightly posteriorly (VC given to flex self forward without assistance - pt able to do so). Pt performed full 5 minutes without aid. Pt encouraged to sit in TIS between therapy session to increase OOB tolerance with pt eventually agreeing. Pt then squat pivot to the L with VC to use L UE on far side of TIS arm rest. Pt continues to recall B LE's placement cue (PTA totalA to place R LE accordingly). Pt then performed transfer with heavy modA  (VC required for head hip relationship). Pt then posteriorly scooted in TIS with minA (PTA adjusted pt to ensure pelvis and torso were in neutral position). Pt positioned in TIS with pillows between arm rest and lateral sides for comfort and to maintain hips in neutral space. Pt also positioned with towels on R leg rest as to avoid pt from placing R LE into excessive external rotation. Pt wife arrived and informed to reach out to nsg if pt starts to fidget too much (pt did this yesterday due to being uncomfortable prior to TIS adjustments). as that can lead to it being unsafe, and to have them move back into bed.   Patient in TIS at end of session with brakes locked, wife present, belt alarm set, and all needs within reach.      Therapy Documentation Precautions:  Precautions Precautions: Fall Precaution Comments: R hemiplegia, slight "pusher", SBP <140 Restrictions Weight Bearing Restrictions: No  Therapy/Group: Individual Therapy  Katey Barrie PTA 09/17/2023, 12:01 PM

## 2023-09-18 DIAGNOSIS — I610 Nontraumatic intracerebral hemorrhage in hemisphere, subcortical: Secondary | ICD-10-CM | POA: Diagnosis not present

## 2023-09-18 LAB — BASIC METABOLIC PANEL
Anion gap: 12 (ref 5–15)
BUN: 34 mg/dL — ABNORMAL HIGH (ref 8–23)
CO2: 26 mmol/L (ref 22–32)
Calcium: 9.2 mg/dL (ref 8.9–10.3)
Chloride: 107 mmol/L (ref 98–111)
Creatinine, Ser: 2.07 mg/dL — ABNORMAL HIGH (ref 0.61–1.24)
GFR, Estimated: 34 mL/min — ABNORMAL LOW (ref >60)
Glucose, Bld: 143 mg/dL — ABNORMAL HIGH (ref 70–99)
Potassium: 3.9 mmol/L (ref 3.5–5.1)
Sodium: 145 mmol/L (ref 135–145)

## 2023-09-18 NOTE — Plan of Care (Signed)
  Problem: Consults Goal: RH STROKE PATIENT EDUCATION Description: See Patient Education module for education specifics  Outcome: Progressing   Problem: RH BOWEL ELIMINATION Goal: RH STG MANAGE BOWEL WITH ASSISTANCE Description: STG Manage Bowel with toileting Assistance. Outcome: Progressing Goal: RH STG MANAGE BOWEL W/MEDICATION W/ASSISTANCE Description: STG Manage Bowel with Medication with mod I Assistance. Outcome: Progressing   Problem: RH SAFETY Goal: RH STG ADHERE TO SAFETY PRECAUTIONS W/ASSISTANCE/DEVICE Description: STG Adhere to Safety Precautions With cues Assistance/Device. Outcome: Progressing   Problem: RH KNOWLEDGE DEFICIT Goal: RH STG INCREASE KNOWLEDGE OF DIABETES Description: Patient and wife will be able to manage prediabetes with dietary modification using educational resources independently Outcome: Progressing Goal: RH STG INCREASE KNOWLEDGE OF HYPERTENSION Description: Patient and wife will be able to manage HTN with medications and dietary modification using educational resources independently Outcome: Progressing Goal: RH STG INCREASE KNOWLEDGE OF DYSPHAGIA/FLUID INTAKE Description: Patient and wife will be able to manage dysphagia, medications and dietary modification using educational resources independently Outcome: Progressing Goal: RH STG INCREASE KNOWLEGDE OF HYPERLIPIDEMIA Description: Patient and wife will be able to manage HLD with medications and dietary modification using educational resources independently Outcome: Progressing   Problem: Education: Goal: Ability to describe self-care measures that may prevent or decrease complications (Diabetes Survival Skills Education) will improve Outcome: Progressing Goal: Individualized Educational Video(s) Outcome: Progressing   Problem: Coping: Goal: Ability to adjust to condition or change in health will improve Outcome: Progressing   Problem: Fluid Volume: Goal: Ability to maintain a balanced  intake and output will improve Outcome: Progressing   Problem: Health Behavior/Discharge Planning: Goal: Ability to identify and utilize available resources and services will improve Outcome: Progressing Goal: Ability to manage health-related needs will improve Outcome: Progressing   Problem: Metabolic: Goal: Ability to maintain appropriate glucose levels will improve Outcome: Progressing   Problem: Nutritional: Goal: Maintenance of adequate nutrition will improve Outcome: Progressing Goal: Progress toward achieving an optimal weight will improve Outcome: Progressing   Problem: Skin Integrity: Goal: Risk for impaired skin integrity will decrease Outcome: Progressing   Problem: Tissue Perfusion: Goal: Adequacy of tissue perfusion will improve Outcome: Progressing

## 2023-09-18 NOTE — Progress Notes (Signed)
Speech Language Pathology Daily Session Note  Patient Details  Name: Kyle [name] MRN: [id number] Date of Birth: 1951-10-23  Today's Date: 09/18/2023 SLP Individual Time: 1300-1345 SLP Individual Time Calculation (min): 45 min  Short Term Goals: Week 2: SLP Short Term Goal 1 (Week 2): Patient will utilize swallowing strategies during consumpution of D1/NTL diet to reduce s/sx of aspiration with min multimodal A SLP Short Term Goal 2 (Week 2): Patient will participate in dysphagia treatment to demonstrate a functional change in oropharyngeal swallow given minA multimodal SLP Short Term Goal 3 (Week 2): Patient will increase speech intelligibility to 80% at the word level given min multimodal A SLP Short Term Goal 4 (Week 2): Patient will demonstrate orientation to time given modi multimodal A  Skilled Therapeutic Interventions:   Patient was seen for skilled ST services targeting dysarthria.  Patient was lying in bed, sleeping upon SLP arrival; however, was agreeable to completing session. Pt declined food trials, as he had just had lunch.  SLP reviewed speech strategies with patient re: SLOP.  SLP also introduced, "Be Clear" dysarthria treatment which uses the easy-to-understand cue of using "clear speech" to encourage changes in speech output without patient having to be conscious of each individual speech change.  SLP facilitated today's session using informal conversation to work on "be clear" strategy.  To an unfamiliar listener in a quiet environment, patient was around 70% intelligible in phrases and 50% intelligible in conversation.  With prompting to repeat using "Be clear" strategy, patient was above 95% intelligible.   Patient was unaware if he has learned about using an "effortful swallow". SLP demonstrated and then encouraged patient to demonstrate exercise.  Patient was able to complete independently.  Recommend continuation of this pharyngeal exercise.   Patient was left in room  with all immediate needs within reach and safety measures activated.  Continue with current plan of care.  Pain Pain Assessment Pain Scale: 0-10 Pain Score: 0-No pain  Therapy/Group: Individual Therapy  Ann Lions 09/18/2023, 2:34 PM

## 2023-09-18 NOTE — Progress Notes (Signed)
Physical Therapy Weekly Progress Note  Patient Details  Name: Kyle [name] MRN: [id number] Date of Birth: 1951-08-30  Beginning of progress report period: {Time; dates multiple:304500300} End of progress report period: {Time; dates multiple:304500300}  {CHL IP REHAB PT TIME CALCULATION:304800500}  Patient has met {number 1-5:22450} of {number 1-5:20334} short term goals.  ***  Patient continues to demonstrate the following deficits {impairments:3041632} and therefore will continue to benefit from skilled PT intervention to increase functional independence with mobility.  Patient {LTG progression:3041653}.  {plan of GMWN:0272536}  PT Short Term Goals {UYQ:0347425}  Skilled Therapeutic Interventions/Progress Updates:      Therapy Documentation Precautions:  Precautions Precautions: Fall Precaution Comments: R hemiplegia, slight "pusher", SBP <140 Restrictions Weight Bearing Restrictions: No General:   Vital Signs: Therapy Vitals BP: (!) 145/62 Pain:   Vision/Perception     Mobility:   Locomotion :    Trunk/Postural Assessment :    Balance:   Exercises:   Other Treatments:     Therapy/Group: {Therapy/Group:3049007}  Loel Dubonnet 09/18/2023, 6:51 PM

## 2023-09-18 NOTE — Progress Notes (Signed)
Physical Therapy Session Note  Patient Details  Name: Kyle [name] MRN: [id number] Date of Birth: 02/12/1951  Today's Date: 09/18/2023 PT Individual Time: 7829-5621 PT Individual Time Calculation (min): 37 min   Short Term Goals: Week 1:  PT Short Term Goal 1 (Week 1): Pt will roll side to side w/ mod A. PT Short Term Goal 2 (Week 1): Pt will transfer sup to sit w/ mod A. PT Short Term Goal 3 (Week 1): Pt will perform squat pivot transfer w/ mod A.  Skilled Therapeutic Interventions/Progress Updates: Patient supine in bed on entrance to room. Patient alert and agreeable to PT session.   Patient reported no pain but did endorse fatigue from the morning/early day.  Therapeutic Activity: Bed Mobility: Pt performed supine<>sit on EOB with heavy modA to bridge R hip to L sidelying with PTA approximating (maxA to flex R knee) at R knee (pt also required VC to bring R UE over chest for increased R side awareness). Pt then cued to advance R LE off of bed with maxA (PTA donned personal shoes and R AFO with totalA). Pt then required min/modA to elevate trunk from L sidelying with VC to push into bed.  PTA noticed R side of pants were soiled (Pt previously reported that nsg had already changed brief earlier) after pt sitting in TIS. PTA checked further along pt's on R side which was noted to be soiled as well down to sheet. Pt performed squat pivot to the R from TIS<EOB with heavy modA/maxA in order to change brief (it was hard to determine if brief was soiled while pt in TIS). PTA and rehab changed bed linen. Pt then maxA to advance R LE onto bed with cues to assist in transition for increased neuromuscular connection with increased time provided. Pt maxA to doff personal pants that were soiled (pt performed supine<L sidelying to assist with maxA with same cues provided above. Pt then supine<R sidelying with modA and increased cues for pt to flex L LE knee in order to bridge over (manual placement and  facilitation required for pt to engage in sequence). Pt brief was not soiled, only noted items above. Pt then scooted to [facility] with modA +2 chuck assist, and VC to bridge self up via L LE, and L UE on R HOB railing.  Transfers: Pt performed squat pivot transfer to the L at beginning of session prior to noticed soiled paints with modA, and VC to reach for far arm rest on TIS and increased anterior lean (pt placed L LE accordingly with PTA placing R with maxA). Pt then squat pivot back to EOB to the R with heavy mod/maxA and same VC during first transfer.  Patient supine in bed at end of session with brakes locked, bed alarm set, and all needs within reach.      Therapy Documentation Precautions:  Precautions Precautions: Fall Precaution Comments: R hemiplegia, slight "pusher", SBP <140 Restrictions Weight Bearing Restrictions: No  Therapy/Group: Individual Therapy  Alece Koppel PTA 09/18/2023, 3:42 PM

## 2023-09-18 NOTE — Progress Notes (Signed)
   09/18/23 0751  Aerosol Therapy Tx  $ Hand Held Nebulizer  2  Medications Breo;Incruse  Delivery Device DPI  Treatment Tolerance Tolerated well  Treatment Given 2  MEWS Score/Color  MEWS Score 0  MEWS Score Color Green  RT Breath Sounds  Bilateral Breath Sounds Clear;Diminished  Oxygen Therapy/Pulse Ox  O2 Device Nasal Cannula  O2 Therapy Oxygen humidified  O2 Flow Rate (L/min) 6 L/min  SpO2 91 %   Increased pt oxygen from 4L to 6L due to pt sats low 80s

## 2023-09-18 NOTE — Progress Notes (Signed)
Physical Therapy Session Note  Patient Details  Name: Kyle [name] MRN: [id number] Date of Birth: 1951-04-06  Today's Date: 09/18/2023 PT Individual Time: 0806-0902 PT Individual Time Calculation (min): 56 min   Short Term Goals: Week 1:  PT Short Term Goal 1 (Week 1): Pt will roll side to side w/ mod A. PT Short Term Goal 2 (Week 1): Pt will transfer sup to sit w/ mod A. PT Short Term Goal 3 (Week 1): Pt will perform squat pivot transfer w/ mod A.   Skilled Therapeutic Interventions/Progress Updates:  Patient supine in bed on entrance to room. Patient alert and agreeable to PT session.   Patient with no pain complaint at start of session. Does complain that he needs to be repositioned in bed as he has slid to bottom.   Therapeutic Activity: Bed Mobility: Initiated LB dressing in supine to don pants and requires TotA for RLE and initiates threading of LLE. Is able to perform bridging with positioning and hold to RLE in hooklying.   Pt performed supine > sit requiring extensive cueing for sequence and technique for rolling to L side and then pushing up to sit. Is able to initiate push up to seated position with less cueing for initiation. Transfers: Squat pivot to pt's R side into w/c with mod/ MaxA and setup with vc for forward lean and increasing effort with use of LLE. Similar need for all squat pivot transfers to R/ L with need for continued cueing and setup for positioning.   Pt performed sit<>stand transfers throughout session with HHA to LUE and MaxA. Provided vc/ tc for improved awareness to need for support to L side d/t reduced motor control to R hemibody.   Neuromuscular Re-ed: NMR facilitated during session with focus on sitting/ standing balance. Pt guided while seated in front of mirror in placement of Squigz to locations on reflection corresponding to body parts. Pt able to pick up Sqiug, place close to called position on mirror, then return to midline. One instance of  dropping Squig but on reset to midline and visually locating, pt is able to lean forward, pick up and return to midline with no physical assist. Pt places and then pulls Squigz from mirror on request for particular color. No LOB but few instances of slow return to midline.   Then progressed to sit <> stand transfers with push from seat with LUE to HHA from rehab tech. Requires Max to [facility] for R hemibody hold. When reducing LOA, pt increases lean to R side with uncontrolled knee flexion. Requires continuous physical assist to maintain upright posture. Intermittent forgetting that he requires hand hold to maintain balance and reaching out for mirror or to scratch face/ wipe mouth.   NMR performed for improvements in motor control and coordination, balance, sequencing, judgement, and self confidence/ efficacy in performing all aspects of mobility at highest level of independence.   Patient supine in bed at end of session with brakes locked, bed alarm set, and all needs within reach. NT notified for pt request of OJ. Reminded re: need for supervision of very small sips.    Therapy Documentation Precautions:  Precautions Precautions: Fall Precaution Comments: R hemiplegia, slight "pusher", SBP <140 Restrictions Weight Bearing Restrictions: No General:   Vital Signs:   Pain:  No pain related this session.   Therapy/Group: Individual Therapy  Loel Dubonnet PT, DPT, CSRS 09/18/2023, 12:29 PM

## 2023-09-18 NOTE — Progress Notes (Signed)
PROGRESS NOTE   Subjective/Complaints:  No acute complaints.  No events overnight.  No apparent volume overload on exam, patient denies any increased swelling, shortness of breath.  Vital stable Labs with BUN, creatinine stable  ROS: Patient denies fever, rash, sore throat, blurred vision, dizziness, nausea, vomiting, diarrhea, cough, shortness of breath or chest pain, joint or back/neck pain, headache, or mood change.   Objective:   No results found. No results for input(s): "WBC", "HGB", "HCT", "PLT" in the last 72 hours.  Recent Labs    09/16/23 0521 09/18/23 0427  NA 143 145  K 3.9 3.9  CL 109 107  CO2 25 26  GLUCOSE 136* 143*  BUN 36* 34*  CREATININE 2.09* 2.07*  CALCIUM 8.8* 9.2    Intake/Output Summary (Last 24 hours) at 09/18/2023 0801 Last data filed at 09/17/2023 1254 Gross per 24 hour  Intake 100 ml  Output --  Net 100 ml         Physical Exam: Vital Signs Blood pressure (!) 154/67, pulse 71, temperature 99.1 F (37.3 C), temperature source Oral, resp. rate 19, height 5\' 11"  (1.803 m), weight 74.6 kg, SpO2 91%.    Constitutional: No distress . Vital signs reviewed.  Laying in bed. HEENT: NCAT, EOMI, oral membranes moist Neck: supple Cardiovascular: RRR without murmur. No JVD    Respiratory/Chest: CTA Bilaterally without wheezes or rales. Normal effort    GI/Abdomen: BS +, non-tender, non-distended Ext: no clubbing, cyanosis, or edema Psych: pleasant and cooperative  Skin: No evidence of breakdown, no evidence of rash Neuro:  pt is alert and oriented. Follows basic commands. Fair insight and awareness. Speech with improved dysarthria. Right central 7. Antigravity and against resistance in left upper and lower extremities; flaccid right upper and lower extremity  From prior exams: RUE 0/5, RLE --trace HF/E and 0/5 KE/ADF/APF. Decreased LT RUE.  Musculoskeletal: Right arm no longer tender to  touch and rom.  + impingement still on RIGHT-no pain with range of motion 11-23   Assessment/Plan: 1. Functional deficits which require 3+ hours per day of interdisciplinary therapy in a comprehensive inpatient rehab setting. Physiatrist is providing close team supervision and 24 hour management of active medical problems listed below. Physiatrist and rehab team continue to assess barriers to discharge/monitor patient progress toward functional and medical goals  Care Tool:  Bathing    Body parts bathed by patient: Right arm, Chest, Abdomen, Front perineal area, Right upper leg, Left upper leg, Face   Body parts bathed by helper: Right lower leg, Left lower leg, Buttocks, Front perineal area, Left arm     Bathing assist Assist Level: Maximal Assistance - Patient 24 - 49%     Upper Body Dressing/Undressing Upper body dressing   What is the patient wearing?: Pull over shirt    Upper body assist Assist Level: Minimal Assistance - Patient > 75%    Lower Body Dressing/Undressing Lower body dressing      What is the patient wearing?: Pants, Incontinence brief, Hospital gown only     Lower body assist Assist for lower body dressing: Maximal Assistance - Patient 25 - 49%     Toileting Toileting  Toileting assist Assist for toileting: Maximal Assistance - Patient 25 - 49%     Transfers Chair/bed transfer  Transfers assist     Chair/bed transfer assist level: Maximal Assistance - Patient 25 - 49%     Locomotion Ambulation   Ambulation assist   Ambulation activity did not occur: Safety/medical concerns          Walk 10 feet activity   Assist  Walk 10 feet activity did not occur: Safety/medical concerns        Walk 50 feet activity   Assist Walk 50 feet with 2 turns activity did not occur: Safety/medical concerns         Walk 150 feet activity   Assist Walk 150 feet activity did not occur: Safety/medical concerns         Walk 10 feet on  uneven surface  activity   Assist Walk 10 feet on uneven surfaces activity did not occur: Safety/medical concerns         Wheelchair     Assist Is the patient using a wheelchair?: Yes Type of Wheelchair: Manual    Wheelchair assist level: Dependent - Patient 0%      Wheelchair 50 feet with 2 turns activity    Assist        Assist Level: Dependent - Patient 0%   Wheelchair 150 feet activity     Assist      Assist Level: Dependent - Patient 0%   Blood pressure (!) 154/67, pulse 71, temperature 99.1 F (37.3 C), temperature source Oral, resp. rate 19, height 5\' 11"  (1.803 m), weight 74.6 kg, SpO2 91%.  Medical Problem List and Plan: 1. Functional deficits secondary to left basal ganglia ICH d/t hypertension/eliquis             -patient may shower             -ELOS/Goals: 12-16+ days, goals min assist with PT, OT, SLP             - resting WHO and PRAFO for right side pending  -Continue CIR therapies including PT, OT, and SLP    2.  Antithrombotics: -DVT/anticoagulation:  Pharmaceutical: Lovenox added per chat w/Dr. Roda Shutters             -antiplatelet therapy: N/A due to bleed 3. Pain Management: Tylenol prn.             -continue low dose gabapentin 100mg  at bedtime--dysesthetic pain improved 4. Mood/Behavior/Sleep: LCSW to follow for evaluation and support.              -antipsychotic agents: N/A 5. Neuropsych/cognition: This patient is capable of making decisions on his own behalf. 6. Skin/Wound Care: Routine pressure relief measures. 7. Fluids/Electrolytes/Nutrition: Monitor I/O. Check CMET in am, poor intake will need IVF              --added low salt restrictions. Add protein supplement for    -push po   8. T2DM insulin dependent: Hgb A1c-6.2 on 09/06/23             -diet controlled CBG (last 3)  Recent Labs    09/15/23 1652 09/15/23 2108 09/16/23 0552  GLUCAP 150* 164* 130*   11//22 reasonable control 11-23: Mildly elevated, remaining less than  200, would consider sliding scale insulin but defer to primary team since this appears consistent with prior readings   9. PAD s/o aortobifemoral bypass w/right pseudoaneurysm: SBP goal < 140.  --Plans for repair early December by  Dr. Sherral Hammers? 10. COPD w/chronic bronchitis/emphysema: currently on 6 L oxygen. Titrate to keep sats > 90% per Dr. Vassie Loll.              --on Trelegy PTA --started on Breo/Incruse today.              --pulmonary toilet. Wean down to baseline as able.  11. CAF: Monitor HR TID. On coreg. Off eliquis due to ICH- per Neuro may resume on 11/25 if stable             --Neurology recommends resuming  Eliquis in 2 weeks if neuro and BP stable.     HR controlled  Vitals:   09/18/23 0751 09/18/23 0759  BP:  (!) 154/67  Pulse: 71   Resp: 19   Temp:    SpO2: 91%     12. Dilated cardiomyopathy/AS/chronic systolic CHF: Low salt diet. Check weight daily--up from 166-->173?             --CXR 11/11 w/vascular congestion-->will order follow up CXR to check for fluid overload as cause of hypoxia.  --Monitor for signs of overload. On Lasix, coreg, Imdur, hydralazine  -LVEF 55-60%   Filed Weights   09/16/23 0500 09/17/23 0500 09/18/23 0500  Weight: 76.6 kg 77.7 kg 74.6 kg  Weights stable , will d/c lasix due to elevated BUN/Creat  11-23: Weight is down today, no signs of volume overload, no medication changes today.  13. CKD: stage IIIb?             -11/17 BUN/Cr stable at 33/1.82, 11/19 baseline creat likely in 1.6-1.8 range based on labs in system         Improving , fluid intake recorded at 11/18 but only 236 mL on 11/19, Creat up, hold lasix starting 11/21  11/22- no labs today, +350cc yesterday--continue to hold lasix  11-23: BUN and creatinine remain elevated, unchanged from prior.  Output not recorded from last 2 days.  No signs of volume overload, continue to hold Lasix.  Encourage p.o. fluids and recheck Monday.    Latest Ref Rng & Units 09/18/2023    4:27 AM  09/16/2023    5:21 AM 09/13/2023   12:28 PM  BMP  Glucose 70 - 99 mg/dL 295  621  308   BUN 8 - 23 mg/dL 34  36  32   Creatinine 0.61 - 1.24 mg/dL 6.57  8.46  9.62   Sodium 135 - 145 mmol/L 145  143  140   Potassium 3.5 - 5.1 mmol/L 3.9  3.9  4.2   Chloride 98 - 111 mmol/L 107  109  108   CO2 22 - 32 mmol/L 26  25  21    Calcium 8.9 - 10.3 mg/dL 9.2  8.8  9.0       14. Dysphagia: On D1, nectar- likely contributing to poor intake of liquids               -encourage fluids, aspiration precautions   -see above  LOS: 8 days A FACE TO FACE EVALUATION WAS PERFORMED  Angelina Sheriff 09/18/2023, 8:01 AM

## 2023-09-18 NOTE — Progress Notes (Signed)
Occupational Therapy Session Note  Patient Details  Name: Kyle [name] MRN: [id number] Date of Birth: 04-03-1951  Today's Date: 09/18/2023 OT Individual Time: 1035-1105 OT Individual Time Calculation (min): 30 min    Short Term Goals: Week 1:  OT Short Term Goal 1 (Week 1): The pt will complete UB bathing and dressing with MinA consistently at 95% safe OT Short Term Goal 1 - Progress (Week 1): Progressing toward goal OT Short Term Goal 2 (Week 1): The pt will complete LB bathing a dressing using AE at Mod A at 95% safe OT Short Term Goal 2 - Progress (Week 1): Progressing toward goal OT Short Term Goal 3 (Week 1): The pt will transfer to all surfaces with Mod A using the sliding board at 95% safe OT Short Term Goal 3 - Progress (Week 1): Progressing toward goal OT Short Term Goal 4 (Week 1): The pt will maintain good sit balance  consistently at 95% safe OT Short Term Goal 4 - Progress (Week 1): Progressing toward goal OT Short Term Goal 5 (Week 1): The pt will tolerate > 45 Minutes of therapy with minimal restbreaks at 95% safe. OT Short Term Goal 5 - Progress (Week 1): Progressing toward goal  Skilled Therapeutic Interventions/Progress Updates: Overal participation in skilled OT services this date as follows:   supine to EOB = moderate assistance, extra time, mod cues for technique, including management and safety of RUE and assistance to mobililze R LE.      Additionally patient focus on Unsupported static sitting balance (Min A fading to CGA), education for R UE awareness and safe R UE positioning       Patient fatigued and allowed to ly back down to rest after about 30 minutes active participation.  At end of session, patient assisted back in supine position with Moderate assistance and extra time.   Required +2 total assist to reposition high up into bed.       Continue POC  Therapy Documentation Precautions:  Precautions Precautions: Fall Precaution Comments: R hemiplegia,  slight "pusher", SBP <140 Restrictions Weight Bearing Restrictions: No General: General OT Amount of Missed Time: 15 Minutes -c/o fatigue Vital Signs: Therapy Vitals Temp: 97.8 F (36.6 C) Temp Source: Oral Pulse Rate: 67 Resp: 17 BP: (!) 145/62 Patient Position (if appropriate): Lying Oxygen Therapy SpO2: 97 % O2 Device: Nasal Cannula O2 Flow Rate (L/min): 4 L/min Pain: Pain Assessment Pain Scale: 0-10 Pain Score: 0-No pain      Therapy/Group: Individual Therapy  Bud Face [facility] 09/18/2023, 4:42 PM

## 2023-09-19 DIAGNOSIS — I610 Nontraumatic intracerebral hemorrhage in hemisphere, subcortical: Secondary | ICD-10-CM | POA: Diagnosis not present

## 2023-09-19 DIAGNOSIS — I10 Essential (primary) hypertension: Secondary | ICD-10-CM | POA: Diagnosis not present

## 2023-09-19 NOTE — Plan of Care (Signed)
  Problem: Consults Goal: RH STROKE PATIENT EDUCATION Description: See Patient Education module for education specifics  Outcome: Progressing   Problem: RH BOWEL ELIMINATION Goal: RH STG MANAGE BOWEL WITH ASSISTANCE Description: STG Manage Bowel with toileting Assistance. Outcome: Progressing Goal: RH STG MANAGE BOWEL W/MEDICATION W/ASSISTANCE Description: STG Manage Bowel with Medication with mod I Assistance. Outcome: Progressing   Problem: RH SAFETY Goal: RH STG ADHERE TO SAFETY PRECAUTIONS W/ASSISTANCE/DEVICE Description: STG Adhere to Safety Precautions With cues Assistance/Device. Outcome: Progressing   Problem: RH KNOWLEDGE DEFICIT Goal: RH STG INCREASE KNOWLEDGE OF DIABETES Description: Patient and wife will be able to manage prediabetes with dietary modification using educational resources independently Outcome: Progressing Goal: RH STG INCREASE KNOWLEDGE OF HYPERTENSION Description: Patient and wife will be able to manage HTN with medications and dietary modification using educational resources independently Outcome: Progressing Goal: RH STG INCREASE KNOWLEDGE OF DYSPHAGIA/FLUID INTAKE Description: Patient and wife will be able to manage dysphagia, medications and dietary modification using educational resources independently Outcome: Progressing Goal: RH STG INCREASE KNOWLEGDE OF HYPERLIPIDEMIA Description: Patient and wife will be able to manage HLD with medications and dietary modification using educational resources independently Outcome: Progressing   Problem: Education: Goal: Ability to describe self-care measures that may prevent or decrease complications (Diabetes Survival Skills Education) will improve Outcome: Progressing Goal: Individualized Educational Video(s) Outcome: Progressing   Problem: Coping: Goal: Ability to adjust to condition or change in health will improve Outcome: Progressing   Problem: Fluid Volume: Goal: Ability to maintain a balanced  intake and output will improve Outcome: Progressing   Problem: Health Behavior/Discharge Planning: Goal: Ability to identify and utilize available resources and services will improve Outcome: Progressing Goal: Ability to manage health-related needs will improve Outcome: Progressing   Problem: Metabolic: Goal: Ability to maintain appropriate glucose levels will improve Outcome: Progressing   Problem: Nutritional: Goal: Maintenance of adequate nutrition will improve Outcome: Progressing Goal: Progress toward achieving an optimal weight will improve Outcome: Progressing   Problem: Skin Integrity: Goal: Risk for impaired skin integrity will decrease Outcome: Progressing   Problem: Tissue Perfusion: Goal: Adequacy of tissue perfusion will improve Outcome: Progressing

## 2023-09-19 NOTE — Progress Notes (Signed)
Speech Language Pathology Daily Session Note  Patient Details  Name: Kyle [name] MRN: [id number] Date of Birth: 1951/10/13  Today's Date: 09/19/2023 SLP Individual Time: 1300-1400 SLP Individual Time Calculation (min): 60 min  Short Term Goals: Week 2: SLP Short Term Goal 1 (Week 2): Patient will utilize swallowing strategies during consumpution of D1/NTL diet to reduce s/sx of aspiration with min multimodal A SLP Short Term Goal 2 (Week 2): Patient will participate in dysphagia treatment to demonstrate a functional change in oropharyngeal swallow given minA multimodal SLP Short Term Goal 3 (Week 2): Patient will increase speech intelligibility to 80% at the word level given min multimodal A SLP Short Term Goal 4 (Week 2): Patient will demonstrate orientation to time given modi multimodal A  Skilled Therapeutic Interventions: Pt seen for skilled ST with focus on swallowing, cognition and speech intelligibility goals, pt in bed and agreeable to therapeutic tasks. Pt seen at end of lunch meal, consuming <50% of meal but reporting feelings of fullness. SLP offering alternative Dys 1 textures to supplement meal however patient politely declining. Reviewed standard swallow precautions as trained and rationale behind recommendations for Dys 1/NTL diet, pt verbalized understanding. Pt benefits from min A cues to scan environment to ID calendar and increase orientation concepts. Pt with difficulty recalling biographical information at times including names of grandchildren. Pt mildly frustrated and self-coaching "Slow down [name], slow down" to increase recall of functional information. Pt independently able to locate external aid for recall of "SLOP" speech strategies. Speech intelligibility at sentence level judged to be ~60-70%, mod cues required throughout to utilize speech intelligibility strategies and respiratory coordination techniques. Pt requires rest breaks to increase breath support, encouraged  to breathe in through nose d/t supplemental O2 via [place]. Pt left in bed with alarm set and all needs within reach, cont ST POC.    Pain Pain Assessment Pain Scale: 0-10 Pain Score: 0-No pain  Therapy/Group: Individual Therapy  Tacey Ruiz 09/19/2023, 1:58 PM

## 2023-09-19 NOTE — Progress Notes (Signed)
PROGRESS NOTE   Subjective/Complaints:  Pt doing well, slept ok. Denies pain. LBM yesterday but not documented since 09/17/23. Urinating ok. Denies any other complaints or concerns today.   ROS: Patient denies fever, rash, sore throat, blurred vision, dizziness, nausea, vomiting, diarrhea, cough, shortness of breath or chest pain, joint or back/neck pain, headache, or mood change.   Objective:   No results found. No results for input(s): "WBC", "HGB", "HCT", "PLT" in the last 72 hours.  Recent Labs    09/18/23 0427  NA 145  K 3.9  CL 107  CO2 26  GLUCOSE 143*  BUN 34*  CREATININE 2.07*  CALCIUM 9.2    Intake/Output Summary (Last 24 hours) at 09/19/2023 1004 Last data filed at 09/19/2023 0800 Gross per 24 hour  Intake 355 ml  Output --  Net 355 ml         Physical Exam: Vital Signs Blood pressure 127/61, pulse 72, temperature 98.2 F (36.8 C), temperature source Oral, resp. rate 16, height 5\' 11"  (1.803 m), weight 74.9 kg, SpO2 92%.    Constitutional: No distress . Vital signs reviewed.  Laying in bed watching TV.  HEENT: NCAT, EOMI, oral membranes moist Neck: supple Cardiovascular: RRR without murmur. No JVD    Respiratory/Chest: CTA Bilaterally without wheezes or rales. Normal effort. On O2 3L via [place] GI/Abdomen: BS +, non-tender, non-distended Ext: no clubbing, cyanosis, or edema Psych: pleasant and cooperative  Skin: No evidence of breakdown, no evidence of rash over exposed surfaces  PRIOR EXAMS: Neuro:  pt is alert and oriented. Follows basic commands. Fair insight and awareness. Speech with improved dysarthria. Right central 7. Antigravity and against resistance in left upper and lower extremities; flaccid right upper and lower extremity  From prior exams: RUE 0/5, RLE --trace HF/E and 0/5 KE/ADF/APF. Decreased LT RUE.  Musculoskeletal: Right arm no longer tender to touch and rom.  + impingement  still on RIGHT-no pain with range of motion 11-23   Assessment/Plan: 1. Functional deficits which require 3+ hours per day of interdisciplinary therapy in a comprehensive inpatient rehab setting. Physiatrist is providing close team supervision and 24 hour management of active medical problems listed below. Physiatrist and rehab team continue to assess barriers to discharge/monitor patient progress toward functional and medical goals  Care Tool:  Bathing    Body parts bathed by patient: Right arm, Chest, Abdomen, Front perineal area, Right upper leg, Left upper leg, Face   Body parts bathed by helper: Right lower leg, Left lower leg, Buttocks, Front perineal area, Left arm     Bathing assist Assist Level: Maximal Assistance - Patient 24 - 49%     Upper Body Dressing/Undressing Upper body dressing   What is the patient wearing?: Pull over shirt    Upper body assist Assist Level: Minimal Assistance - Patient > 75%    Lower Body Dressing/Undressing Lower body dressing      What is the patient wearing?: Pants, Incontinence brief, Hospital gown only     Lower body assist Assist for lower body dressing: Maximal Assistance - Patient 25 - 49%     Toileting Toileting    Toileting assist Assist for toileting: Maximal  Assistance - Patient 25 - 49%     Transfers Chair/bed transfer  Transfers assist     Chair/bed transfer assist level: Maximal Assistance - Patient 25 - 49%     Locomotion Ambulation   Ambulation assist   Ambulation activity did not occur: Safety/medical concerns          Walk 10 feet activity   Assist  Walk 10 feet activity did not occur: Safety/medical concerns        Walk 50 feet activity   Assist Walk 50 feet with 2 turns activity did not occur: Safety/medical concerns         Walk 150 feet activity   Assist Walk 150 feet activity did not occur: Safety/medical concerns         Walk 10 feet on uneven surface   activity   Assist Walk 10 feet on uneven surfaces activity did not occur: Safety/medical concerns         Wheelchair     Assist Is the patient using a wheelchair?: Yes Type of Wheelchair: Manual    Wheelchair assist level: Dependent - Patient 0%      Wheelchair 50 feet with 2 turns activity    Assist        Assist Level: Dependent - Patient 0%   Wheelchair 150 feet activity     Assist      Assist Level: Dependent - Patient 0%   Blood pressure 127/61, pulse 72, temperature 98.2 F (36.8 C), temperature source Oral, resp. rate 16, height 5\' 11"  (1.803 m), weight 74.9 kg, SpO2 92%.  Medical Problem List and Plan: 1. Functional deficits secondary to left basal ganglia ICH d/t hypertension/eliquis             -patient may shower             -ELOS/Goals: 12-16+ days, goals min assist with PT, OT, SLP             - resting WHO and PRAFO for right side pending  -Continue CIR therapies including PT, OT, and SLP    2.  Antithrombotics: -DVT/anticoagulation:  Pharmaceutical: Lovenox 30mg  daily added per chat w/Dr. Roda Shutters             -antiplatelet therapy: N/A due to bleed 3. Pain Management: Tylenol prn. Voltaren gel QID.              -continue low dose gabapentin 100mg  at bedtime--dysesthetic pain improved 4. Mood/Behavior/Sleep: LCSW to follow for evaluation and support.              -antipsychotic agents: N/A 5. Neuropsych/cognition: This patient is capable of making decisions on his own behalf. 6. Skin/Wound Care: Routine pressure relief measures. 7. Fluids/Electrolytes/Nutrition: Monitor I/O. Check CMET in am, poor intake will need IVF              --added low salt restrictions. Add protein supplement    -push po   8. T2DM insulin dependent: Hgb A1c-6.2 on 09/06/23             -diet controlled -11//22 reasonable control -11-23: Mildly elevated, remaining less than 200, would consider sliding scale insulin but defer to primary team since this appears  consistent with prior readings -09/19/23 no CBGs recently other than on labs yesterday; looks like SSI/CBG checks were d/c'd 11/21; monitor as appropriate per primary   9. PAD s/o aortobifemoral bypass w/right pseudoaneurysm: SBP goal < 140.  --Plans for repair early December by Dr. Sherral Hammers? 10.  COPD w/chronic bronchitis/emphysema: currently on 6 L oxygen. Titrate to keep sats > 90% per Dr. Vassie Loll.              --on Trelegy PTA --started on Breo/Incruse today.              --pulmonary toilet. Wean down to baseline as able.  11. CAF: Monitor HR TID. On coreg 6.25mg  BID. Off eliquis due to ICH- per Neuro may resume on 11/25 if stable --Neurology recommends resuming  Eliquis in 2 weeks if neuro and BP stable.    -09/19/23 HR controlled  Vitals:   09/19/23 0848 09/19/23 0903  BP:  127/61  Pulse: 72   Resp: 16   Temp:    SpO2: 92%     12. Dilated cardiomyopathy/AS/chronic systolic CHF: Low salt diet. Check weight daily--up from 166-->173? --CXR 11/11 w/vascular congestion-->will order follow up CXR to check for fluid overload as cause of hypoxia.  --Monitor for signs of overload. On Lasix 20mg  daily, coreg 6.25mg  BID, Imdur 30mg  daily, hydralazine 100mg  TID -LVEF 55-60% -Weights stable , will d/c lasix due to elevated BUN/Creat  -11-23: Weight is down today, no signs of volume overload, no medication changes today.  -09/19/23 wt stable from yesterday, down from 2 days ago, no edema/overload appearance, cont holding lasix for now.  Filed Weights   09/17/23 0500 09/18/23 0500 09/19/23 0440  Weight: 77.7 kg 74.6 kg 74.9 kg    13. CKD: stage IIIb? -11/17 BUN/Cr stable at 33/1.82, 11/19 baseline creat likely in 1.6-1.8 range based on labs in system  Improving , fluid intake recorded at 11/18 but only 236 mL on 11/19, Creat up, hold lasix starting 11/21  11/22- no labs today, +350cc yesterday--continue to hold lasix 11-23: BUN and creatinine remain elevated, unchanged from prior.  Output  not recorded from last 2 days.  No signs of volume overload, continue to hold Lasix.  Encourage p.o. fluids and recheck Monday.    Latest Ref Rng & Units 09/18/2023    4:27 AM 09/16/2023    5:21 AM 09/13/2023   12:28 PM  BMP  Glucose 70 - 99 mg/dL 161  096  045   BUN 8 - 23 mg/dL 34  36  32   Creatinine 0.61 - 1.24 mg/dL 4.09  8.11  9.14   Sodium 135 - 145 mmol/L 145  143  140   Potassium 3.5 - 5.1 mmol/L 3.9  3.9  4.2   Chloride 98 - 111 mmol/L 107  109  108   CO2 22 - 32 mmol/L 26  25  21    Calcium 8.9 - 10.3 mg/dL 9.2  8.8  9.0       14. Dysphagia: On D1, nectar- likely contributing to poor intake of liquids               -encourage fluids, aspiration precautions   -see above  15. HTN: on amlodipine 10mg  daily, coreg 6.25mg  BID, clonidine 0.2mg  q8h, hydralazine 100mg  TID  -09/19/23 BP variable but improving/stable today, monitor Vitals:   09/16/23 1329 09/16/23 2005 09/17/23 0322 09/17/23 1317  BP: (!) 154/96 (!) 135/57 125/62 (!) 137/58   09/17/23 1944 09/18/23 0522 09/18/23 0759 09/18/23 1248  BP: (!) 138/56 (!) 140/55 (!) 154/67 (!) 145/62   09/18/23 1527 09/18/23 1922 09/19/23 0425 09/19/23 0903  BP: (!) 145/62 (!) 120/55 (!) 125/54 127/61    16. Hx of seizure: continue keppra 500mg  BID  17. HLD: continue rosuvastatin 10mg  QHS  LOS: 9 days A FACE TO FACE EVALUATION WAS PERFORMED  [address] 09/19/2023, 10:04 AM

## 2023-09-20 ENCOUNTER — Inpatient Hospital Stay (HOSPITAL_COMMUNITY): Payer: Medicare HMO

## 2023-09-20 DIAGNOSIS — I610 Nontraumatic intracerebral hemorrhage in hemisphere, subcortical: Secondary | ICD-10-CM | POA: Diagnosis not present

## 2023-09-20 DIAGNOSIS — I5022 Chronic systolic (congestive) heart failure: Secondary | ICD-10-CM | POA: Diagnosis not present

## 2023-09-20 DIAGNOSIS — I10 Essential (primary) hypertension: Secondary | ICD-10-CM | POA: Diagnosis not present

## 2023-09-20 DIAGNOSIS — E1151 Type 2 diabetes mellitus with diabetic peripheral angiopathy without gangrene: Secondary | ICD-10-CM | POA: Diagnosis not present

## 2023-09-20 LAB — CBC
HCT: 38.7 % — ABNORMAL LOW (ref 39.0–52.0)
Hemoglobin: 12.3 g/dL — ABNORMAL LOW (ref 13.0–17.0)
MCH: 29.3 pg (ref 26.0–34.0)
MCHC: 31.8 g/dL (ref 30.0–36.0)
MCV: 92.1 fL (ref 80.0–100.0)
Platelets: 222 10*3/uL (ref 150–400)
RBC: 4.2 MIL/uL — ABNORMAL LOW (ref 4.22–5.81)
RDW: 14.7 % (ref 11.5–15.5)
WBC: 8 10*3/uL (ref 4.0–10.5)
nRBC: 0.3 % — ABNORMAL HIGH (ref 0.0–0.2)

## 2023-09-20 LAB — BASIC METABOLIC PANEL
Anion gap: 12 (ref 5–15)
BUN: 29 mg/dL — ABNORMAL HIGH (ref 8–23)
CO2: 25 mmol/L (ref 22–32)
Calcium: 9.3 mg/dL (ref 8.9–10.3)
Chloride: 106 mmol/L (ref 98–111)
Creatinine, Ser: 2 mg/dL — ABNORMAL HIGH (ref 0.61–1.24)
GFR, Estimated: 35 mL/min — ABNORMAL LOW (ref >60)
Glucose, Bld: 153 mg/dL — ABNORMAL HIGH (ref 70–99)
Potassium: 4 mmol/L (ref 3.5–5.1)
Sodium: 143 mmol/L (ref 135–145)

## 2023-09-20 NOTE — Progress Notes (Signed)
Physical Therapy Session Note  Patient Details  Name: Kyle [name] MRN: [id number] Date of Birth: 08/03/1951  Today's Date: 09/20/2023 PT Individual Time: 6433-2951 PT Individual Time Calculation (min): 51 min   Short Term Goals: Week 1:  PT Short Term Goal 1 (Week 1): Pt will roll side to side w/ mod A. PT Short Term Goal 1 - Progress (Week 1): Progressing toward goal PT Short Term Goal 2 (Week 1): Pt will transfer sup to sit w/ mod A. PT Short Term Goal 2 - Progress (Week 1): Progressing toward goal PT Short Term Goal 3 (Week 1): Pt will perform squat pivot transfer w/ mod A. PT Short Term Goal 3 - Progress (Week 1): Progressing toward goal  Skilled Therapeutic Interventions/Progress Updates:  Patient supine in bed on entrance to room. Patient alert and agreeable to PT session. Pt with Bil shoes and R AFO already donned.   Patient with no pain complaint at start of session.  Therapeutic Activity: Bed Mobility: Pt performed supine > sit with Mod A and continued cueing for sequencing technique. Transfers: Pt performed squat pivot transfer bed >w/c toward R side. Pt with improved lift from EOB and improved performance with L hand placement on bed surface. Reaches w/c in one effort. At end of session, pt also demos improved effort with ability to reach position at EOB in one effort and Mod/ MaxA.   Sit<>stand transfers performed throughout session at hallway HR with MaxA for power up and ModA to complete with overall MaxA for maintaining balance. VC/ tc for RLE placement, hand placement on rail, forward lean and initial effort.   Gait Training:  Pt ambulated 30' x1/ 34' x2 ft using L hallway HR with maxA overall. Required MaxA to advance RLE as well as to block/ stabilize knee during stance phase. Requires facilitation with vc for lateral weight shift, forward hip/ pelvic translation, knee extension with block during stance phase and heavy heel/ foot strike for improved sensation through  increased joint approximation at end of TotA swing phase.  Provided vc/ tc for sequencing throughout. Continues to demonstrate improved sequencing following first amb bout.   Wheelchair Mobility:  Pt propelled wheelchair using LLE only with supervision. Does fatigue quickly but is able to perform large arc turn to L side and can maintain straight path.  Provided vc/ tc for foot positioning.   Patient supine in bed at end of session with brakes locked, bed alarm set, and all needs within reach.  Therapy Documentation Precautions:  Precautions Precautions: Fall Precaution Comments: R hemiplegia, slight "pusher", SBP <140 Restrictions Weight Bearing Restrictions: No  Pain:  No pain related throughout session.   Therapy/Group: Individual Therapy  Loel Dubonnet PT, DPT, CSRS 09/20/2023, 5:48 PM

## 2023-09-20 NOTE — Progress Notes (Signed)
Physical Therapy Session Note  Patient Details  Name: Kyle [name] MRN: [id number] Date of Birth: 1951-05-03  Today's Date: 09/20/2023 PT Individual Time: 0915-0930 PT Individual Time Calculation (min): 15 min  and Today's Date: 09/20/2023 PT Missed Time: 30 Minutes Missed Time Reason: CT/MRI  Short Term Goals: Week 1:  PT Short Term Goal 1 (Week 1): Pt will roll side to side w/ mod A. PT Short Term Goal 1 - Progress (Week 1): Progressing toward goal PT Short Term Goal 2 (Week 1): Pt will transfer sup to sit w/ mod A. PT Short Term Goal 2 - Progress (Week 1): Progressing toward goal PT Short Term Goal 3 (Week 1): Pt will perform squat pivot transfer w/ mod A. PT Short Term Goal 3 - Progress (Week 1): Progressing toward goal Week 2:  PT Short Term Goal 1 (Week 2): Pt will roll side to side w/ mod A. PT Short Term Goal 2 (Week 2): Pt will transfer sup to sit w/ mod A. PT Short Term Goal 3 (Week 2): Pt will perform squat pivot transfer w/ mod A. PT Short Term Goal 4 (Week 2): Pt will ambulate 30 ft using LRAD with maxA. PT Short Term Goal 5 (Week 2): Pt will initiate w/c mobility.  Skilled Therapeutic Interventions/Progress Updates:  Patient supine in bed on entrance to room. Patient alert and agreeable to PT session.   Patient with no pain complaint at start of session.  Relates that he has not yet worn his PRAFO overnight when asked. Sign acquired and brought hang over pt's HOB.   Donned for pt in order to demonstrate feel and provide instructions on donning and fit. Removed when transport arrives to take pt to CT scan for updated imaging.   Patient supine in bed at end of session in care of transport tech.    Therapy Documentation Precautions:  Precautions Precautions: Fall Precaution Comments: R hemiplegia, slight "pusher", SBP <140 Restrictions Weight Bearing Restrictions: No General: PT Amount of Missed Time (min): 30 Minutes PT Missed Treatment Reason:  CT/MRI  Pain:  No pain related this session.    Therapy/Group: Individual Therapy  Loel Dubonnet PT, DPT, CSRS 09/20/2023, 9:49 AM

## 2023-09-20 NOTE — Progress Notes (Signed)
Patient ID: Kyle [name], male   DOB: 05/21/1951, 72 y.o.   MRN: [id number]  Sw informed by [employer] that patient has outstanding balance with agency. [employer] unable to service patient until balance has been paid. Patient/spouse will need to reach out to internal collections at [phone].

## 2023-09-20 NOTE — Progress Notes (Addendum)
PROGRESS NOTE   Subjective/Complaints:   No issues overnite , per pharm, ~2wk post ICH , neuro has indicated it may be safe to start Eliquis pending repeat CT head   ROS: Patient denies CP, SOB, N/V/D Objective:   No results found. Recent Labs    09/20/23 0502  WBC 8.0  HGB 12.3*  HCT 38.7*  PLT 222    Recent Labs    09/18/23 0427  NA 145  K 3.9  CL 107  CO2 26  GLUCOSE 143*  BUN 34*  CREATININE 2.07*  CALCIUM 9.2    Intake/Output Summary (Last 24 hours) at 09/20/2023 0834 Last data filed at 09/20/2023 0810 Gross per 24 hour  Intake 608 ml  Output --  Net 608 ml         Physical Exam: Vital Signs Blood pressure (!) 130/57, pulse 68, temperature 97.9 F (36.6 C), temperature source Oral, resp. rate 18, height 5\' 11"  (1.803 m), weight 74.8 kg, SpO2 100%.  General: No acute distress Mood and affect are appropriate Heart: Regular rate and rhythm no rubs murmurs or extra sounds Lungs: Clear to auscultation, breathing unlabored, no rales or wheezes Abdomen: Positive bowel sounds, soft nontender to palpation, nondistended Extremities: No clubbing, cyanosis, or edema Skin: No evidence of breakdown, no evidence of rash  Musculoskeletal: Full range of motion in all 4 extremities. Pain with R hip ROM , reduced int rotation , RLE warm , no erythema    PRIOR EXAMS: Neuro:  pt is alert and oriented. Follows basic commands. Fair insight and awareness. Speech with improved dysarthria. Right central 7. Antigravity and against resistance in left upper and lower extremities; flaccid right upper and lower extremity  From prior exams: RUE 0/5, RLE --trace HF/E and 0/5 KE/ADF/APF. Decreased LT RUE.     Assessment/Plan: 1. Functional deficits which require 3+ hours per day of interdisciplinary therapy in a comprehensive inpatient rehab setting. Physiatrist is providing close team supervision and 24 hour management  of active medical problems listed below. Physiatrist and rehab team continue to assess barriers to discharge/monitor patient progress toward functional and medical goals  Care Tool:  Bathing    Body parts bathed by patient: Right arm, Chest, Abdomen, Front perineal area, Right upper leg, Left upper leg, Face   Body parts bathed by helper: Right lower leg, Left lower leg, Buttocks, Front perineal area, Left arm     Bathing assist Assist Level: Maximal Assistance - Patient 24 - 49%     Upper Body Dressing/Undressing Upper body dressing   What is the patient wearing?: Pull over shirt    Upper body assist Assist Level: Minimal Assistance - Patient > 75%    Lower Body Dressing/Undressing Lower body dressing      What is the patient wearing?: Pants, Incontinence brief, Hospital gown only     Lower body assist Assist for lower body dressing: Maximal Assistance - Patient 25 - 49%     Toileting Toileting    Toileting assist Assist for toileting: Maximal Assistance - Patient 25 - 49%     Transfers Chair/bed transfer  Transfers assist     Chair/bed transfer assist level: Maximal Assistance - Patient  25 - 49%     Locomotion Ambulation   Ambulation assist   Ambulation activity did not occur: Safety/medical concerns          Walk 10 feet activity   Assist  Walk 10 feet activity did not occur: Safety/medical concerns        Walk 50 feet activity   Assist Walk 50 feet with 2 turns activity did not occur: Safety/medical concerns         Walk 150 feet activity   Assist Walk 150 feet activity did not occur: Safety/medical concerns         Walk 10 feet on uneven surface  activity   Assist Walk 10 feet on uneven surfaces activity did not occur: Safety/medical concerns         Wheelchair     Assist Is the patient using a wheelchair?: Yes Type of Wheelchair: Manual    Wheelchair assist level: Dependent - Patient 0%      Wheelchair  50 feet with 2 turns activity    Assist        Assist Level: Dependent - Patient 0%   Wheelchair 150 feet activity     Assist      Assist Level: Dependent - Patient 0%   Blood pressure (!) 130/57, pulse 68, temperature 97.9 F (36.6 C), temperature source Oral, resp. rate 18, height 5\' 11"  (1.803 m), weight 74.8 kg, SpO2 100%.  Medical Problem List and Plan: 1. Functional deficits secondary to left basal ganglia ICH d/t hypertension/eliquis             -patient may shower             -ELOS/Goals: 12-16+ days, goals min assist with PT, OT, SLP             - resting WHO and PRAFO for right side pending  -Continue CIR therapies including PT, OT, and SLP    Repeat CT head prior to resuming Eliquis  2.  Antithrombotics: -DVT/anticoagulation:  Pharmaceutical: Lovenox 30mg  daily added              -antiplatelet therapy: N/A due to bleed 3. Pain Management: Tylenol prn. Voltaren gel QID.              -continue low dose gabapentin 100mg  at bedtime--dysesthetic pain improved 4. Mood/Behavior/Sleep: LCSW to follow for evaluation and support.              -antipsychotic agents: N/A 5. Neuropsych/cognition: This patient is capable of making decisions on his own behalf. 6. Skin/Wound Care: Routine pressure relief measures. 7. Fluids/Electrolytes/Nutrition: Monitor I/O. Check CMET in am, poor intake will need IVF              --added low salt restrictions. Add protein supplement    -push po   8. T2DM insulin dependent: Hgb A1c-6.2 on 09/06/23             -diet controlled -11//22 reasonable control -11-23: Mildly elevated, remaining less than 200, would consider sliding scale insulin but defer to primary team since this appears consistent with prior readings -09/19/23 no CBGs recently other than on labs yesterday; looks like SSI/CBG checks were d/c'd 11/21; monitor as appropriate per primary   9. PAD s/o aortobifemoral bypass w/right pseudoaneurysm: SBP goal < 140.  --Plans for  repair early December by Dr. Sherral Hammers? 10. COPD w/chronic bronchitis/emphysema: currently on 6 L oxygen. Titrate to keep sats > 90% per Dr. Vassie Loll.              --  on Trelegy PTA --started on Breo/Incruse today.              --pulmonary toilet. Wean down to baseline as able.  11. CAF: Monitor HR TID. On coreg 6.25mg  BID. Off eliquis due to ICH- per Neuro may resume on 11/25 if stable --Neurology recommends resuming  Eliquis in 2 weeks if neuro and BP stable.    -09/19/23 HR controlled  Vitals:   09/19/23 1932 09/20/23 0410  BP: (!) 146/61 (!) 130/57  Pulse: 77 68  Resp: 18 18  Temp: 97.7 F (36.5 C) 97.9 F (36.6 C)  SpO2: 90% 100%    12. Dilated cardiomyopathy/AS/chronic systolic CHF: Low salt diet. Check weight daily--up from 166-->173? --CXR 11/11 w/vascular congestion-->will order follow up CXR to check for fluid overload as cause of hypoxia.  --Monitor for signs of overload. On Lasix 20mg  daily, coreg 6.25mg  BID, Imdur 30mg  daily, hydralazine 100mg  TID -LVEF 55-60% -Weights stable , will d/c lasix due to elevated BUN/Creat  -11-23: Weight is down today, no signs of volume overload, no medication changes today.  -09/19/23 wt stable from yesterday, down from 2 days ago, no edema/overload appearance, cont holding lasix for now.  Filed Weights   09/18/23 0500 09/19/23 0440 09/20/23 0500  Weight: 74.6 kg 74.9 kg 74.8 kg    13. CKD: stage IIIb? -11/17 BUN/Cr stable at 33/1.82, 11/19 baseline creat likely in 1.6-1.8 range based on labs in system  Improving , fluid intake recorded at 11/18 but only 236 mL on 11/19, Creat up, hold lasix starting 11/21  11/22- no labs today, +350cc yesterday--continue to hold lasix 11-23: BUN and creatinine remain elevated, unchanged from prior.  Output not recorded from last 2 days.  No signs of volume overload, continue to hold Lasix.  Encourage p.o. fluids and recheck Monday.    Latest Ref Rng & Units 09/18/2023    4:27 AM 09/16/2023    5:21 AM  09/13/2023   12:28 PM  BMP  Glucose 70 - 99 mg/dL 478  295  621   BUN 8 - 23 mg/dL 34  36  32   Creatinine 0.61 - 1.24 mg/dL 3.08  6.57  8.46   Sodium 135 - 145 mmol/L 145  143  140   Potassium 3.5 - 5.1 mmol/L 3.9  3.9  4.2   Chloride 98 - 111 mmol/L 107  109  108   CO2 22 - 32 mmol/L 26  25  21    Calcium 8.9 - 10.3 mg/dL 9.2  8.8  9.0    BMET pnd   14. Dysphagia: On D1, nectar- likely contributing to poor intake of liquids               -encourage fluids, aspiration precautions   -see above  15. HTN: on amlodipine 10mg  daily, coreg 6.25mg  BID, clonidine 0.2mg  q8h, hydralazine 100mg  TID  -09/19/23 BP variable but improving/stable today, monitor Vitals:   09/17/23 1944 09/18/23 0522 09/18/23 0759 09/18/23 1248  BP: (!) 138/56 (!) 140/55 (!) 154/67 (!) 145/62   09/18/23 1527 09/18/23 1922 09/19/23 0425 09/19/23 0903  BP: (!) 145/62 (!) 120/55 (!) 125/54 127/61   09/19/23 1216 09/19/23 1600 09/19/23 1932 09/20/23 0410  BP: 131/66 124/76 (!) 146/61 (!) 130/57    16. Hx of seizure: continue keppra 500mg  BID  17. HLD: continue rosuvastatin 10mg  QHS  18.  Right hip pain suspect OA + immobility check xray hip LOS: 10 days A FACE TO FACE EVALUATION WAS PERFORMED  Kyle [name] 09/20/2023, 8:34 AM

## 2023-09-20 NOTE — Progress Notes (Signed)
Occupational Therapy Session Note  Patient Details  Name: Kyle [name] MRN: [id number] Date of Birth: Mar 01, 1951  Today's Date: 09/20/2023 OT Individual Time: 1100-1210 OT Individual Time Calculation (min): 70 min    Short Term Goals: Week 1:  OT Short Term Goal 1 (Week 1): The pt will complete UB bathing and dressing with MinA consistently at 95% safe OT Short Term Goal 1 - Progress (Week 1): Progressing toward goal OT Short Term Goal 2 (Week 1): The pt will complete LB bathing a dressing using AE at Mod A at 95% safe OT Short Term Goal 2 - Progress (Week 1): Progressing toward goal OT Short Term Goal 3 (Week 1): The pt will transfer to all surfaces with Mod A using the sliding board at 95% safe OT Short Term Goal 3 - Progress (Week 1): Progressing toward goal OT Short Term Goal 4 (Week 1): The pt will maintain good sit balance  consistently at 95% safe OT Short Term Goal 4 - Progress (Week 1): Progressing toward goal OT Short Term Goal 5 (Week 1): The pt will tolerate > 45 Minutes of therapy with minimal restbreaks at 95% safe. OT Short Term Goal 5 - Progress (Week 1): Progressing toward goal Week 2:  OT Short Term Goal 1 (Week 2): pt will demonstrate improved sitting balance to sit upright without back support consistently for at least 10 minutes at a time to enable him to engage in sefl care tasks at EOB. OT Short Term Goal 2 (Week 2): Pt will don shirt with mod A demonstrating improved attention and awareness of RUE. OT Short Term Goal 3 (Week 2): Pt will complete squat pivots w/c >< bed or mat to L and to R consistently with max A of 1 to progress to completing toilet transfers in a safe manner (pt currently requires a stedy lift) OT Short Term Goal 4 (Week 2): Pt will demonstrate improved awareness of RUE by initiating reaching for R arm with L arm to prepare for bed mobility.  Skilled Therapeutic Interventions/Progress Updates:     Pt received in bed. Wife present and rehab tech  present for first half of session. His wife had just finished washing his LB. Pt assisted with donning pants and shoes in bed.  Integrated RLE a/arom by having pt use inner thigh to pull leg into midline.  Pt then assisted with rolling to left and rising to sit with mod A. Once at EOB, pt able to sit EOB with close S and no hand support demonstrating improved trunk control and midline awareness. Pt able to carry this trunk control over with performing forward lean and weight shift to lift hips with min A. Pt able to complete 3 small squat pivots to move from bed to w/c all with min A and cues to keep hips elevated during pivot.    Improved impulse control, following directions well.   Once in w/c, positioned at sink to engage in UB bathing, dressing and grooming with cues for hemiplegic techniques to bathe with min A and mod A UB dressing.    Scrubbed R hand as he is developing and odor.   Pt moved away from sink and positioned pillow behind his back for fully upright posture.   Placed estim on R shoulder and R forearm for 30 min on intensity 28 on both.  Integrated functional activity with estim with hand over hand guiding for grasp and release with cones, "picking " up cone as if it was a  glass and guiding to his mouth, pushing arm forward and back on table to "push and pull" and object.  Pt has no active motion so needs 100% hand over hand, cued to feel the movement and attend to R hand imagining if he was moving it for mental imagery training.  Pt tolerated estim well.  Doffed pads and placed alarm belt around pt. Set up his tray table to prepare for lunch.   Pt resting in w/c with all needs met. Alarm set and call light in reach.    Therapy Documentation Precautions:  Precautions Precautions: Fall Precaution Comments: R hemiplegia, slight "pusher", SBP <140 Restrictions Weight Bearing Restrictions: No Therapy Vitals Temp: 97.8 F (36.6 C) Pulse Rate: 67 Resp: 19 BP: 124/63 Patient  Position (if appropriate): Sitting Oxygen Therapy SpO2: 100 % O2 Device: Nasal Cannula O2 Flow Rate (L/min): 3 L/min Pain: Pain Assessment Pain Scale: 0-10 Pain Score: 0-No pain ADL: ADL Equipment Provided:  (Stedyx2) Eating: Minimal assistance Where Assessed-Eating: Bed level Grooming: Minimal assistance Where Assessed-Grooming: Edge of bed Upper Body Bathing: Moderate assistance Where Assessed-Upper Body Bathing: Edge of bed Lower Body Bathing: Maximal assistance Where Assessed-Lower Body Bathing: Edge of bed Upper Body Dressing: Moderate assistance Where Assessed-Upper Body Dressing: Edge of bed Lower Body Dressing: Maximal assistance Where Assessed-Lower Body Dressing: Edge of bed Toileting: Dependent Where Assessed-Toileting: Bedside Commode (BSC over toilet) Toilet Transfer: Maximal assistance Toilet Transfer Method: Other (comment) (STEDY lift +2 A) Tub/Shower Transfer: Unable to assess Tub/Shower Transfer Method:  (UTA) Tub/Shower Equipment: Other (comment) (UTA) Walk-In Shower Transfer: Unable to assess Film/video editor Method: Unable to assess (UTA) Walk-In Shower Equipment: Other (comment) (UTA) ADL Comments: Patient inconsistent with unsupported sitting   Therapy/Group: Individual Therapy  Wyndi Northrup 09/20/2023, 1:39 PM

## 2023-09-20 NOTE — Progress Notes (Signed)
Physical Therapy Session Note  Patient Details  Name: Kyle [name] MRN: [id number] Date of Birth: [birth year]  Today's Date: 09/20/2023 PT Individual Time: 1000-1035 PT Individual Time Calculation (min): 35 min   Short Term Goals: Week 1:  PT Short Term Goal 1 (Week 1): Pt will roll side to side w/ mod A. PT Short Term Goal 1 - Progress (Week 1): Progressing toward goal PT Short Term Goal 2 (Week 1): Pt will transfer sup to sit w/ mod A. PT Short Term Goal 2 - Progress (Week 1): Progressing toward goal PT Short Term Goal 3 (Week 1): Pt will perform squat pivot transfer w/ mod A. PT Short Term Goal 3 - Progress (Week 1): Progressing toward goal Week 2:  PT Short Term Goal 1 (Week 2): Pt will roll side to side w/ mod A. PT Short Term Goal 2 (Week 2): Pt will transfer sup to sit w/ mod A. PT Short Term Goal 3 (Week 2): Pt will perform squat pivot transfer w/ mod A. PT Short Term Goal 4 (Week 2): Pt will ambulate 30 ft using LRAD with maxA. PT Short Term Goal 5 (Week 2): Pt will initiate w/c mobility.  Skilled Therapeutic Interventions/Progress Updates:  Patient supine in bed on entrance to room. Patient alert and agreeable to PT session. Pt seen for make-up time since pt in imagain during scheduled time.   Patient with no pain complaint at start of session.  Wife enters room at start of session.   Therapeutic Activity: Pt continues to demonstrate reduced motor control with RLE and minimal, brief activation during reflexive responses such as yawning, coughing, etc. Pt open to use of electrical stimulation on quadriceps group in attempt to increase muscle fiber facilitation.   Estim: Applied R LE functional electrical stimulation to quadriceps muscles during supine SAQ therex using [place] e-stim device set on NMES L with timed activation of stimulation during performance of SAQ - intensity 42mA with palpable muscle contraction. Pt with 10sec off and 20sec on. After completion of  e-stim, pt denies any negative side effects with skin intact and no adverse side effects noted.   During final of treatment, pt instructed to perform SAQ bilaterally in order to attempt to increase activation to RLE. Quad activation noted with pulse on but no knee extension noted on RLE.  NMR performed for improvements in motor control and coordination, balance, sequencing, judgement, and self confidence/ efficacy in performing all aspects of mobility at highest level of independence.    Patient supine in bed at end of session with brakes locked, bed alarm set, and all needs within reach. Pt able to assist in moving toward [facility] with MaxA and use of bed features.     Therapy Documentation Precautions:  Precautions Precautions: Fall Precaution Comments: R hemiplegia, slight "pusher", SBP <140 Restrictions Weight Bearing Restrictions: No  Pain:  No pain related this session.   Therapy/Group: Individual Therapy  Loel Dubonnet PT, DPT, CSRS 09/20/2023, 9:57 AM

## 2023-09-20 NOTE — Plan of Care (Signed)
  Problem: Consults Goal: RH STROKE PATIENT EDUCATION Description: See Patient Education module for education specifics  Outcome: Progressing   Problem: RH BOWEL ELIMINATION Goal: RH STG MANAGE BOWEL WITH ASSISTANCE Description: STG Manage Bowel with toileting Assistance. Outcome: Progressing Goal: RH STG MANAGE BOWEL W/MEDICATION W/ASSISTANCE Description: STG Manage Bowel with Medication with mod I Assistance. Outcome: Progressing   Problem: RH SAFETY Goal: RH STG ADHERE TO SAFETY PRECAUTIONS W/ASSISTANCE/DEVICE Description: STG Adhere to Safety Precautions With cues Assistance/Device. Outcome: Progressing   Problem: RH KNOWLEDGE DEFICIT Goal: RH STG INCREASE KNOWLEDGE OF DIABETES Description: Patient and wife will be able to manage prediabetes with dietary modification using educational resources independently Outcome: Progressing Goal: RH STG INCREASE KNOWLEDGE OF HYPERTENSION Description: Patient and wife will be able to manage HTN with medications and dietary modification using educational resources independently Outcome: Progressing Goal: RH STG INCREASE KNOWLEDGE OF DYSPHAGIA/FLUID INTAKE Description: Patient and wife will be able to manage dysphagia, medications and dietary modification using educational resources independently Outcome: Progressing Goal: RH STG INCREASE KNOWLEGDE OF HYPERLIPIDEMIA Description: Patient and wife will be able to manage HLD with medications and dietary modification using educational resources independently Outcome: Progressing   Problem: Education: Goal: Ability to describe self-care measures that may prevent or decrease complications (Diabetes Survival Skills Education) will improve Outcome: Progressing Goal: Individualized Educational Video(s) Outcome: Progressing   Problem: Coping: Goal: Ability to adjust to condition or change in health will improve Outcome: Progressing   Problem: Fluid Volume: Goal: Ability to maintain a balanced  intake and output will improve Outcome: Progressing   Problem: Health Behavior/Discharge Planning: Goal: Ability to identify and utilize available resources and services will improve Outcome: Progressing Goal: Ability to manage health-related needs will improve Outcome: Progressing   Problem: Metabolic: Goal: Ability to maintain appropriate glucose levels will improve Outcome: Progressing   Problem: Nutritional: Goal: Maintenance of adequate nutrition will improve Outcome: Progressing Goal: Progress toward achieving an optimal weight will improve Outcome: Progressing   Problem: Skin Integrity: Goal: Risk for impaired skin integrity will decrease Outcome: Progressing   Problem: Tissue Perfusion: Goal: Adequacy of tissue perfusion will improve Outcome: Progressing

## 2023-09-20 NOTE — Progress Notes (Signed)
Speech Language Pathology Daily Session Note  Patient Details  Name: Kyle [name] MRN: [id number] Date of Birth: 06-07-1951  Today's Date: 09/20/2023 SLP Individual Time: 0145-0245 SLP Individual Time Calculation (min): 60 min  Short Term Goals: Week 2: SLP Short Term Goal 1 (Week 2): Patient will utilize swallowing strategies during consumpution of D1/NTL diet to reduce s/sx of aspiration with min multimodal A SLP Short Term Goal 2 (Week 2): Patient will participate in dysphagia treatment to demonstrate a functional change in oropharyngeal swallow given minA multimodal SLP Short Term Goal 3 (Week 2): Patient will increase speech intelligibility to 80% at the word level given min multimodal A SLP Short Term Goal 4 (Week 2): Patient will demonstrate orientation to time given modi multimodal A  Skilled Therapeutic Interventions:  Patient was seen in PM to address speech intelligibiility and dysphagia management. Pt was alert and seen in bed. Attempted to have pt reposition upright for session however pt declined incline to ~90 degrees. Educated pt on benefits for speech intelligibility however, he continued to decline. SLP focused session on speech intelligibility. SLP reviewed speech intelligibility strategies. Given a visual stimulus, pt verbalized object names with ~60% speech intelligibility indep improving to 90% with min A. Notable errors included initial consonant deletion, consonant cluster reduction of /r/  and /l/ blends, and syllable reduction. Focus placed on conant cluster reduction of /r/ blends. Given mod A pt improved production of sounds to 100% intelligibility. Pt subsequently challenged in production of phrases during structured picture description task. Pt completed task with 80% intelligibility in known context. Intelligibility drastically reduced in unknown informal context to 50% with pt warranting mod to max cues for pausing between words and over articulation. At conclusion  of session, pt completed 1 set of 10 effortful swallows, 1 set of 10 CTARs, and 1 set of 5 [place]. Pt left in bed with call button within reach and bed alarm active. SLP to continue POC.   Pain Pain Assessment Pain Scale: 0-10 Pain Score: 0-No pain  Therapy/Group: Individual Therapy  Renaee Munda 09/20/2023, 3:46 PM

## 2023-09-21 ENCOUNTER — Inpatient Hospital Stay (HOSPITAL_COMMUNITY): Payer: Medicare HMO

## 2023-09-21 NOTE — Progress Notes (Signed)
Occupational Therapy Session Note  Patient Details  Name: Kyle [name] MRN: [id number] Date of Birth: February 16, 1951  Today's Date: 09/21/2023 OT Individual Time: 1345-1415 OT Individual Time Calculation (min): 30 min    Short Term Goals: Week 1:  OT Short Term Goal 1 (Week 1): The pt will complete UB bathing and dressing with MinA consistently at 95% safe OT Short Term Goal 1 - Progress (Week 1): Progressing toward goal OT Short Term Goal 2 (Week 1): The pt will complete LB bathing a dressing using AE at Mod A at 95% safe OT Short Term Goal 2 - Progress (Week 1): Progressing toward goal OT Short Term Goal 3 (Week 1): The pt will transfer to all surfaces with Mod A using the sliding board at 95% safe OT Short Term Goal 3 - Progress (Week 1): Progressing toward goal OT Short Term Goal 4 (Week 1): The pt will maintain good sit balance  consistently at 95% safe OT Short Term Goal 4 - Progress (Week 1): Progressing toward goal OT Short Term Goal 5 (Week 1): The pt will tolerate > 45 Minutes of therapy with minimal restbreaks at 95% safe. OT Short Term Goal 5 - Progress (Week 1): Progressing toward goal Week 2:  OT Short Term Goal 1 (Week 2): pt will demonstrate improved sitting balance to sit upright without back support consistently for at least 10 minutes at a time to enable him to engage in sefl care tasks at EOB. OT Short Term Goal 2 (Week 2): Pt will don shirt with mod A demonstrating improved attention and awareness of RUE. OT Short Term Goal 3 (Week 2): Pt will complete squat pivots w/c >< bed or mat to L and to R consistently with max A of 1 to progress to completing toilet transfers in a safe manner (pt currently requires a stedy lift) OT Short Term Goal 4 (Week 2): Pt will demonstrate improved awareness of RUE by initiating reaching for R arm with L arm to prepare for bed mobility.  Skilled Therapeutic Interventions/Progress Updates:    1:1 Pt received in the bed. Pt reported he didn't  want to get out of bed and stay in w/c. In bed focus on rolling to the right left with focus on rotating hip in both directions, remembering management of UE and protraction of shoulder when rolling to the left. In sidelying focus on protraction and retraction of scapular with UE assisted against gravity, shoulder shrugs, elbow extension and flexion. No noted distal return noted today. Practiced coming to EOB with min A with extra time (coming up on left elbow with cues to bring nose over his knees. Focus on maintaining static sitting balance and then progressing towards reaching forward in simulated dressing activities- outside of BOS and then returning to neutral (without leaning too far back). Then progressing to reaching down to ankles and then returning to upright sitting posture with min guard. Also focus on picking up left leg as if threading left pant leg while maintaining balance with min to mod A (with more dynamic balance). Returned to supine with mod A. Then focus on long sitting in bed and coming into forward reach with both UEs for stretch and ability to come forward off support with contact guard. Left resting in the bed.   Therapy Documentation Precautions:  Precautions Precautions: Fall Precaution Comments: R hemiplegia, slight "pusher", SBP <140 Restrictions Weight Bearing Restrictions: No   No pain in session    Therapy/Group: Individual Therapy  Roney Mans  [place] 09/21/2023, 3:10 PM

## 2023-09-21 NOTE — Plan of Care (Signed)
Repeat CT reviewed and left BG hemorrhage absorbed fairly well. OK to restart [facility] for afib treatment and in preparation for pseudoaneurysm treatment. Discussed with Dr. Wynn Banker and PA Rinaldo Cloud, will start heparin IV per stroke protocol for 2 days and if tolerating well, will transition back to eliquis. Please keep the [facility] appointment after discharge. Will follow up at [facility] also with Dr. Pearlean Brownie after discharge.   Marvel Plan, MD PhD Stroke Neurology 09/21/2023 4:33 PM

## 2023-09-21 NOTE — Progress Notes (Signed)
Notified Delle Reining, [place] of CT results.    Tilden Dome, LPN

## 2023-09-21 NOTE — Progress Notes (Signed)
Speech Language Pathology Daily Session Note  Patient Details  Name: Kyle [name] MRN: [id number] Date of Birth: 1951/03/18  Today's Date: 09/21/2023 SLP Individual Time: 0800-0833 SLP Individual Time Calculation (min): 33 min  Short Term Goals: Week 2: SLP Short Term Goal 1 (Week 2): Patient will utilize swallowing strategies during consumpution of D1/NTL diet to reduce s/sx of aspiration with min multimodal A SLP Short Term Goal 2 (Week 2): Patient will participate in dysphagia treatment to demonstrate a functional change in oropharyngeal swallow given minA multimodal SLP Short Term Goal 3 (Week 2): Patient will increase speech intelligibility to 80% at the word level given min multimodal A SLP Short Term Goal 4 (Week 2): Patient will demonstrate orientation to time given modi multimodal A  Skilled Therapeutic Interventions: Skilled therapy session focused on swallowing goals. SLP facilitated session by providing oral care and offering patient trials of D1/NTL on breakfast tray. Patient required minA to sit upright at 90 degrees and education on why this is important. Mild anterior spillage and R buccal pocketing present throughout, requiring modA to clear. Patient with questionable throat clears vs groans throughout PO intake which may be indicative of aspiration. Recommend continuation of current diet with plan to re-evaluate oropharyngeal swallow before d/c. Recommend continued education on importance of following standardized swallowing precautions (sip upright, small bites/sips, slow rate) as well as lingual/digital sweep. At conclusion of meal and oral care, transport arrived to take patient to CT. Remaining 27 minutes of therapy missed. SLP will attempt to make up missed minutes as therapist/patient schedule allows.   Pain None reported   Therapy/Group: Individual Therapy  Bailyn Spackman M.A., CF-SLP 09/21/2023, 8:39 AM

## 2023-09-21 NOTE — Progress Notes (Signed)
PROGRESS NOTE   Subjective/Complaints: CT head still not performed , ordered 23 h ago   No issues overnite , per pharm, ~2wk post ICH , neuro has indicated it may be safe to start Eliquis pending repeat CT head  Reviewed R hip xray  ROS: Patient denies CP, SOB, N/V/D Objective:   DG HIP UNILAT WITH PELVIS 2-3 VIEWS RIGHT  Result Date: 09/20/2023 CLINICAL DATA:  72 year old male with right hip pain. EXAM: DG HIP (WITH OR WITHOUT PELVIS) 2-3V RIGHT COMPARISON:  CTA Chest, Abdomen, and Pelvis 09/06/2023. FINDINGS: AP pelvis, AP and frogleg lateral views of the right hip at 0940 hours. AP pelvis and hip views are mildly oblique to the right. Femoral heads remain normally located. Pelvis appears intact. Nonobstructed bowel gas. Vascular calcifications in the pelvis. Right inguinal and right thigh surgical clips. Proximal right femur intact. No acute osseous abnormality identified. IMPRESSION: No acute osseous abnormality identified about the right hip or pelvis. Electronically Signed   By: Odessa Fleming M.D.   On: 09/20/2023 12:29   Recent Labs    09/20/23 0502  WBC 8.0  HGB 12.3*  HCT 38.7*  PLT 222    Recent Labs    09/20/23 1247  NA 143  K 4.0  CL 106  CO2 25  GLUCOSE 153*  BUN 29*  CREATININE 2.00*  CALCIUM 9.3    Intake/Output Summary (Last 24 hours) at 09/21/2023 0753 Last data filed at 09/20/2023 1831 Gross per 24 hour  Intake 500 ml  Output --  Net 500 ml         Physical Exam: Vital Signs Blood pressure 138/61, pulse 69, temperature 98.4 F (36.9 C), temperature source Oral, resp. rate 18, height 5\' 11"  (1.803 m), weight 75.6 kg, SpO2 95%.  General: No acute distress Mood and affect are appropriate Heart: Regular rate and rhythm no rubs murmurs or extra sounds Lungs: Clear to auscultation, breathing unlabored, no rales or wheezes Abdomen: Positive bowel sounds, soft nontender to palpation,  nondistended Extremities: No clubbing, cyanosis, or edema Skin: No evidence of breakdown, no evidence of rash, old incision Right hip   Musculoskeletal: Full range of motion in all 4 extremities. Pain with R hip ROM , reduced int rotation , RLE warm , no erythema    PRIOR EXAMS: Neuro:  pt is alert and oriented. Follows basic commands. Fair insight and awareness. Speech with improved dysarthria. Right central 7. Antigravity and against resistance in left upper and lower extremities; flaccid right upper and lower extremity Flaccid tone  From prior exams: RUE 0/5, RLE --trace HF/E and 0/5 KE/ADF/APF. Decreased LT RUE.     Assessment/Plan: 1. Functional deficits which require 3+ hours per day of interdisciplinary therapy in a comprehensive inpatient rehab setting. Physiatrist is providing close team supervision and 24 hour management of active medical problems listed below. Physiatrist and rehab team continue to assess barriers to discharge/monitor patient progress toward functional and medical goals  Care Tool:  Bathing    Body parts bathed by patient: Right arm, Chest, Abdomen, Front perineal area, Right upper leg, Left upper leg, Face   Body parts bathed by helper: Right lower leg, Left lower leg, Buttocks,  Front perineal area, Left arm     Bathing assist Assist Level: Maximal Assistance - Patient 24 - 49%     Upper Body Dressing/Undressing Upper body dressing   What is the patient wearing?: Pull over shirt    Upper body assist Assist Level: Minimal Assistance - Patient > 75%    Lower Body Dressing/Undressing Lower body dressing      What is the patient wearing?: Pants, Incontinence brief, Hospital gown only     Lower body assist Assist for lower body dressing: Maximal Assistance - Patient 25 - 49%     Toileting Toileting    Toileting assist Assist for toileting: Maximal Assistance - Patient 25 - 49%     Transfers Chair/bed transfer  Transfers assist      Chair/bed transfer assist level: Maximal Assistance - Patient 25 - 49%     Locomotion Ambulation   Ambulation assist   Ambulation activity did not occur: Safety/medical concerns          Walk 10 feet activity   Assist  Walk 10 feet activity did not occur: Safety/medical concerns        Walk 50 feet activity   Assist Walk 50 feet with 2 turns activity did not occur: Safety/medical concerns         Walk 150 feet activity   Assist Walk 150 feet activity did not occur: Safety/medical concerns         Walk 10 feet on uneven surface  activity   Assist Walk 10 feet on uneven surfaces activity did not occur: Safety/medical concerns         Wheelchair     Assist Is the patient using a wheelchair?: Yes Type of Wheelchair: Manual    Wheelchair assist level: Dependent - Patient 0%      Wheelchair 50 feet with 2 turns activity    Assist        Assist Level: Dependent - Patient 0%   Wheelchair 150 feet activity     Assist      Assist Level: Dependent - Patient 0%   Blood pressure 138/61, pulse 69, temperature 98.4 F (36.9 C), temperature source Oral, resp. rate 18, height 5\' 11"  (1.803 m), weight 75.6 kg, SpO2 95%.  Medical Problem List and Plan: 1. Functional deficits secondary to left basal ganglia ICH d/t hypertension/eliquis             -patient may shower             -ELOS/Goals: 12-16+ days, goals min assist with PT, OT, SLP             - resting WHO and PRAFO for right side pending  -Continue CIR therapies including PT, OT, and SLP    Repeat CT head prior to resuming Eliquis - still pending  2.  Antithrombotics: -DVT/anticoagulation:  Pharmaceutical: Lovenox 30mg  daily added              -antiplatelet therapy: N/A due to bleed 3. Pain Management: Tylenol prn. Voltaren gel QID.              -continue low dose gabapentin 100mg  at bedtime--dysesthetic pain improved 4. Mood/Behavior/Sleep: LCSW to follow for evaluation  and support.              -antipsychotic agents: N/A 5. Neuropsych/cognition: This patient is capable of making decisions on his own behalf. 6. Skin/Wound Care: Routine pressure relief measures. 7. Fluids/Electrolytes/Nutrition: Monitor I/O. Check CMET in am, poor intake will need  IVF              --added low salt restrictions. Add protein supplement    -push po   8. T2DM insulin dependent: Hgb A1c-6.2 on 09/06/23             -diet controlled -11//22 reasonable control -11-23: Mildly elevated, remaining less than 200, would consider sliding scale insulin but defer to primary team since this appears consistent with prior readings -09/19/23 no CBGs recently other than on labs yesterday; looks like SSI/CBG checks were d/c'd 11/21; monitor as appropriate per primary   9. PAD s/o aortobifemoral bypass w/right pseudoaneurysm: SBP goal < 140.  --Plans for repair early December by Dr. Sherral Hammers? 10. COPD w/chronic bronchitis/emphysema: currently on 6 L oxygen. Titrate to keep sats > 90% per Dr. Vassie Loll.              --on Trelegy PTA --started on Breo/Incruse today.              --pulmonary toilet. Wean down to baseline as able.  11. CAF: Monitor HR TID. On coreg 6.25mg  BID. Off eliquis due to ICH- per Neuro may resume on 11/25 if stable --Neurology recommends resuming  Eliquis in 2 weeks if neuro and BP stable.    -09/19/23 HR controlled  Vitals:   09/20/23 2000 09/21/23 0354  BP: (!) 150/70 138/61  Pulse: 70 69  Resp: 18 18  Temp: 97.8 F (36.6 C) 98.4 F (36.9 C)  SpO2: 100% 95%    12. Dilated cardiomyopathy/AS/chronic systolic CHF: Low salt diet. Check weight daily--up from 166-->173? --CXR 11/11 w/vascular congestion-->will order follow up CXR to check for fluid overload as cause of hypoxia.  --Monitor for signs of overload. On Lasix 20mg  daily, coreg 6.25mg  BID, Imdur 30mg  daily, hydralazine 100mg  TID -LVEF 55-60% -Weights stable , will d/c lasix due to elevated BUN/Creat  -11-23: Weight  is down today, no signs of volume overload, no medication changes today.  -09/19/23 wt stable from yesterday, down from 2 days ago, no edema/overload appearance, cont holding lasix for now.  Filed Weights   09/19/23 0440 09/20/23 0500 09/21/23 0500  Weight: 74.9 kg 74.8 kg 75.6 kg    13. CKD: stage IIIb? -11/17 BUN/Cr stable at 33/1.82, 11/19 baseline creat likely in 1.6-1.8 range based on labs in system  Improving , fluid intake recorded at 11/18 but only 236 mL on 11/19, Creat up, hold lasix starting 11/21  11/22- no labs today, +350cc yesterday--continue to hold lasix 11-23: BUN and creatinine remain elevated, unchanged from prior.  Output not recorded from last 2 days.  No signs of volume overload, continue to hold Lasix.  Encourage p.o. fluids and recheck Monday.    Latest Ref Rng & Units 09/20/2023   12:47 PM 09/18/2023    4:27 AM 09/16/2023    5:21 AM  BMP  Glucose 70 - 99 mg/dL 161  096  045   BUN 8 - 23 mg/dL 29  34  36   Creatinine 0.61 - 1.24 mg/dL 4.09  8.11  9.14   Sodium 135 - 145 mmol/L 143  145  143   Potassium 3.5 - 5.1 mmol/L 4.0  3.9  3.9   Chloride 98 - 111 mmol/L 106  107  109   CO2 22 - 32 mmol/L 25  26  25    Calcium 8.9 - 10.3 mg/dL 9.3  9.2  8.8    BMET improving , creat stable no signs of fluid overload intake remains  poor ( yesterday ) cont to hold lasix    14. Dysphagia: On D1, nectar- likely contributing to poor intake of liquids               -encourage fluids, aspiration precautions   -see above  15. HTN: on amlodipine 10mg  daily, coreg 6.25mg  BID, clonidine 0.2mg  q8h, hydralazine 100mg  TID  -09/19/23 BP variable but improving/stable today, monitor Vitals:   09/18/23 1922 09/19/23 0425 09/19/23 0903 09/19/23 1216  BP: (!) 120/55 (!) 125/54 127/61 131/66   09/19/23 1600 09/19/23 1932 09/20/23 0410 09/20/23 0851  BP: 124/76 (!) 146/61 (!) 130/57 (!) 125/56   09/20/23 1255 09/20/23 1257 09/20/23 2000 09/21/23 0354  BP: 124/63 124/63 (!)  150/70 138/61    16. Hx of seizure: continue keppra 500mg  BID  17. HLD: continue rosuvastatin 10mg  QHS  18.  Right hip pain suspect OA + immobility xray hip is unremarkable  LOS: 11 days A FACE TO FACE EVALUATION WAS PERFORMED  Erick Colace 09/21/2023, 7:53 AM

## 2023-09-21 NOTE — Progress Notes (Signed)
Occupational Therapy Session Note  Patient Details  Name: Kyle [name] MRN: [id number] Date of Birth: Mar 22, 1951  Today's Date: 09/21/2023 OT Individual Time: 1110-1210 OT Individual Time Calculation (min): 60 min    Short Term Goals: Week 1:  OT Short Term Goal 1 (Week 1): The pt will complete UB bathing and dressing with MinA consistently at 95% safe OT Short Term Goal 1 - Progress (Week 1): Progressing toward goal OT Short Term Goal 2 (Week 1): The pt will complete LB bathing a dressing using AE at Mod A at 95% safe OT Short Term Goal 2 - Progress (Week 1): Progressing toward goal OT Short Term Goal 3 (Week 1): The pt will transfer to all surfaces with Mod A using the sliding board at 95% safe OT Short Term Goal 3 - Progress (Week 1): Progressing toward goal OT Short Term Goal 4 (Week 1): The pt will maintain good sit balance  consistently at 95% safe OT Short Term Goal 4 - Progress (Week 1): Progressing toward goal OT Short Term Goal 5 (Week 1): The pt will tolerate > 45 Minutes of therapy with minimal restbreaks at 95% safe. OT Short Term Goal 5 - Progress (Week 1): Progressing toward goal Week 2:  OT Short Term Goal 1 (Week 2): pt will demonstrate improved sitting balance to sit upright without back support consistently for at least 10 minutes at a time to enable him to engage in sefl care tasks at EOB. OT Short Term Goal 2 (Week 2): Pt will don shirt with mod A demonstrating improved attention and awareness of RUE. OT Short Term Goal 3 (Week 2): Pt will complete squat pivots w/c >< bed or mat to L and to R consistently with max A of 1 to progress to completing toilet transfers in a safe manner (pt currently requires a stedy lift) OT Short Term Goal 4 (Week 2): Pt will demonstrate improved awareness of RUE by initiating reaching for R arm with L arm to prepare for bed mobility.  Skilled Therapeutic Interventions/Progress Updates:    Pt received in bed resting post PT appointment.   His wife was present for most of the session.   From supine, PROM to RUE with activities to try to elicit a tone response (tapping, hand drop exercise). No tone.  Due to no change in shoulder (except for some new active scapular retraction) after several trials of estim, placed kinesiotape on R shoulder for sensory feedback and support.   Pt then rolled to sidelying and sit with mod A.  Rehab tech present to assist as focus of today's session on sit to stands (3x) and holding static stand (up to 2 minutes at a time) to increase potential to stand with LB self care.   With mod A of therapist on his R side guiding his shoulders forward for forward weight shift and then to bring trunk forward to lift hips and mod A through R leg to push to stand, pt able to rise to stand with heavy Max first trial progressing to light mod A on 3rd trial.    Tech on L side to help with cues to shift L hand from bed to back of arm chair placed next to him on the left for support in standing as he was rising to stand.  Pt initially sinking into R hip and with cues to contract glutes and shift to L foot pt able to stand with somewhat less support. Pt moved back to supine and  then to chair position in bed to prepare for lunch.   Placed estim on R dorsal forearm at intensity 28 for wrist and finger extensors for 10 minutes with guided focus to attend to hand.  Then moved to ventral side of forearm for finger flexion at intensity 25 for 10 min of unattended estim. Used [employer] estim unit which does not have reciprocal channel settings.  Pt tolerated estim well.  Pt in room with tech assisting with lunch.   Therapy Documentation Precautions:  Precautions Precautions: Fall Precaution Comments: R hemiplegia, slight "pusher", SBP <140 Restrictions Weight Bearing Restrictions: No   Pain:  No c/o pain  ADL: ADL Equipment Provided:  (Stedyx2) Eating: Minimal assistance Where Assessed-Eating: Bed level Grooming: Minimal  assistance Where Assessed-Grooming: Edge of bed Upper Body Bathing: Moderate assistance Where Assessed-Upper Body Bathing: Edge of bed Lower Body Bathing: Maximal assistance Where Assessed-Lower Body Bathing: Edge of bed Upper Body Dressing: Moderate assistance Where Assessed-Upper Body Dressing: Edge of bed Lower Body Dressing: Maximal assistance Where Assessed-Lower Body Dressing: Edge of bed Toileting: Dependent Where Assessed-Toileting: Bedside Commode (BSC over toilet) Toilet Transfer: Maximal assistance Toilet Transfer Method: Other (comment) (STEDY lift +2 A) Tub/Shower Transfer: Unable to assess Tub/Shower Transfer Method:  (UTA) Tub/Shower Equipment: Other (comment) (UTA) Walk-In Shower Transfer: Unable to assess Film/video editor Method: Unable to assess (UTA) Walk-In Shower Equipment: Other (comment) (UTA) ADL Comments: Patient inconsistent with unsupported sitting   Therapy/Group: Individual Therapy  Teeghan Hammer 09/21/2023, 8:45 AM

## 2023-09-21 NOTE — Plan of Care (Signed)
  Problem: Consults Goal: RH STROKE PATIENT EDUCATION Description: See Patient Education module for education specifics  Outcome: Progressing   Problem: RH BOWEL ELIMINATION Goal: RH STG MANAGE BOWEL WITH ASSISTANCE Description: STG Manage Bowel with toileting Assistance. Outcome: Progressing Goal: RH STG MANAGE BOWEL W/MEDICATION W/ASSISTANCE Description: STG Manage Bowel with Medication with mod I Assistance. Outcome: Progressing   Problem: RH SAFETY Goal: RH STG ADHERE TO SAFETY PRECAUTIONS W/ASSISTANCE/DEVICE Description: STG Adhere to Safety Precautions With cues Assistance/Device. Outcome: Progressing   Problem: RH KNOWLEDGE DEFICIT Goal: RH STG INCREASE KNOWLEDGE OF DIABETES Description: Patient and wife will be able to manage prediabetes with dietary modification using educational resources independently Outcome: Progressing Goal: RH STG INCREASE KNOWLEDGE OF HYPERTENSION Description: Patient and wife will be able to manage HTN with medications and dietary modification using educational resources independently Outcome: Progressing Goal: RH STG INCREASE KNOWLEDGE OF DYSPHAGIA/FLUID INTAKE Description: Patient and wife will be able to manage dysphagia, medications and dietary modification using educational resources independently Outcome: Progressing Goal: RH STG INCREASE KNOWLEGDE OF HYPERLIPIDEMIA Description: Patient and wife will be able to manage HLD with medications and dietary modification using educational resources independently Outcome: Progressing   Problem: Education: Goal: Ability to describe self-care measures that may prevent or decrease complications (Diabetes Survival Skills Education) will improve Outcome: Progressing Goal: Individualized Educational Video(s) Outcome: Progressing   Problem: Coping: Goal: Ability to adjust to condition or change in health will improve Outcome: Progressing   Problem: Fluid Volume: Goal: Ability to maintain a balanced  intake and output will improve Outcome: Progressing   Problem: Health Behavior/Discharge Planning: Goal: Ability to identify and utilize available resources and services will improve Outcome: Progressing Goal: Ability to manage health-related needs will improve Outcome: Progressing   Problem: Metabolic: Goal: Ability to maintain appropriate glucose levels will improve Outcome: Progressing   Problem: Nutritional: Goal: Maintenance of adequate nutrition will improve Outcome: Progressing Goal: Progress toward achieving an optimal weight will improve Outcome: Progressing   Problem: Skin Integrity: Goal: Risk for impaired skin integrity will decrease Outcome: Progressing   Problem: Tissue Perfusion: Goal: Adequacy of tissue perfusion will improve Outcome: Progressing

## 2023-09-21 NOTE — Progress Notes (Signed)
Physical Therapy Session Note  Patient Details  Name: Kyle [name] MRN: [id number] Date of Birth: 01/11/1951  Today's Date: 09/21/2023 PT Individual Time: 0904-1002 PT Individual Time Calculation (min): 58 min   Short Term Goals: Week 1:  PT Short Term Goal 1 (Week 1): Pt will roll side to side w/ mod A. PT Short Term Goal 1 - Progress (Week 1): Progressing toward goal PT Short Term Goal 2 (Week 1): Pt will transfer sup to sit w/ mod A. PT Short Term Goal 2 - Progress (Week 1): Progressing toward goal PT Short Term Goal 3 (Week 1): Pt will perform squat pivot transfer w/ mod A. PT Short Term Goal 3 - Progress (Week 1): Progressing toward goal Week 2:  PT Short Term Goal 1 (Week 2): Pt will roll side to side w/ mod A. PT Short Term Goal 2 (Week 2): Pt will transfer sup to sit w/ mod A. PT Short Term Goal 3 (Week 2): Pt will perform squat pivot transfer w/ mod A. PT Short Term Goal 4 (Week 2): Pt will ambulate 30 ft using LRAD with maxA. PT Short Term Goal 5 (Week 2): Pt will initiate w/c mobility.  Skilled Therapeutic Interventions/Progress Updates:  Patient supine in bed on entrance to room. Patient alert and agreeable to PT session. +2 available throughout session from rehab tech and nurse tech for safety and +2 assist to maintain balance.   Patient with no pain complaint at start of session.  Therapeutic Activity: Bed Mobility: Pt performed supine <> sit with ***. VC/ tc required for ***. Transfers: Pt performed sit<>stand and stand pivot transfers throughout session with ***. Provided verbal cues for***.  Neuromuscular Re-ed: NMR facilitated during session with focus on***. Pt guided in ***. NMR performed for improvements in motor control and coordination, balance, sequencing, judgement, and self confidence/ efficacy in performing all aspects of mobility at highest level of independence.   Patient *** at end of session with brakes locked, *** alarm set, and all needs within  reach.  - while supine in bed, RLE in frog leg position. Guided pt in RLE extension when placed in 90/90. Also placed in hooklying position and asked pt to push leg straight. Has some adduction and so is able to demo initiation of movement with leg moving toward midline from ER/ flexed positioning. Pt is able to bring Rle into straight position with significant effort and some compensatory trunk movement.   Bed mobility requiring MaxA to complete but able to follow all instructions.   - squat pivot to R side into w/c with mod/ MaxA and require additional lift for power up   - requests to have bowel movement. STEDY to toilet. Pt standing in STEDY and continues to require MaxA to maintain upright posture and balance. LOB to R side corrected with Max/ TotA.   Therapy Documentation Precautions:  Precautions Precautions: Fall Precaution Comments: R hemiplegia, slight "pusher", SBP <140 Restrictions Weight Bearing Restrictions: No General:   Vital Signs:   Pain:    Therapy/Group: Individual Therapy  Loel Dubonnet PT, DPT, CSRS 09/21/2023, 8:58 AM

## 2023-09-22 LAB — HEPARIN LEVEL (UNFRACTIONATED): Heparin Unfractionated: 0.33 [IU]/mL (ref 0.30–0.70)

## 2023-09-22 MED ORDER — HEPARIN (PORCINE) 25000 UT/250ML-% IV SOLN
1100.0000 [IU]/h | INTRAVENOUS | Status: AC
  Administered 2023-09-22 – 2023-09-23 (×2): 1050 [IU]/h via INTRAVENOUS
  Administered 2023-09-24: 1100 [IU]/h via INTRAVENOUS
  Filled 2023-09-22 (×3): qty 250

## 2023-09-22 NOTE — Progress Notes (Signed)
Patient ID: Kyle [name], male   DOB: 03/14/1951, 72 y.o.   MRN: [id number]  Team Conference Report to Patient/Family  Team Conference discussion was reviewed with the patient and caregiver, including goals, any changes in plan of care and target discharge date.  Patient and caregiver express understanding and are in agreement.  The patient has a target discharge date of 10/07/23.   SW met with patient and spouse in the room and provided team conference updates. Daughter contacted by spouse via telephone. Sw provided conference updates and arranged education to allow the family to see pt's care needs. Spouse arranged education Monday 12/2 9-12 and daughter arranged education 12/7 9-12. No additional questions or concerns currently.  Andria Rhein 09/22/2023, 1:51 PM

## 2023-09-22 NOTE — Progress Notes (Signed)
Physical Therapy Session Note  Patient Details  Name: Kyle [name] MRN: [id number] Date of Birth: [birth year]  Today's Date: 09/22/2023 PT Individual Time: 1301-1347 PT Individual Time Calculation (min): 46 min   Short Term Goals: Week 1:  PT Short Term Goal 1 (Week 1): Pt will roll side to side w/ mod A. PT Short Term Goal 1 - Progress (Week 1): Progressing toward goal PT Short Term Goal 2 (Week 1): Pt will transfer sup to sit w/ mod A. PT Short Term Goal 2 - Progress (Week 1): Progressing toward goal PT Short Term Goal 3 (Week 1): Pt will perform squat pivot transfer w/ mod A. PT Short Term Goal 3 - Progress (Week 1): Progressing toward goal Week 2:  PT Short Term Goal 1 (Week 2): Pt will roll side to side w/ mod A. PT Short Term Goal 2 (Week 2): Pt will transfer sup to sit w/ mod A. PT Short Term Goal 3 (Week 2): Pt will perform squat pivot transfer w/ mod A. PT Short Term Goal 4 (Week 2): Pt will ambulate 30 ft using LRAD with maxA. PT Short Term Goal 5 (Week 2): Pt will initiate w/c mobility.  Skilled Therapeutic Interventions/Progress Updates:  Patient supine in bed on entrance to room. Pt indicates soreness in L upper trap. Patient alert and agreeable to PT session.   Patient with no pain complaint at start of session.  Therapeutic Activity: Bed Mobility: Pt requires maxA +1 along with use of bed features to reposition toward HOB. With vc, pt is able to assist with push of LLE.   MHP applied to pt's shoulders during session for improvement of TP and muscle pain in L shoulder/ trapezius muscle.  Estim: Applied R LE functional electrical stimulation to quadriceps muscles during supine SAQ therex using [place] e-stim device set on NMES L with timed activation of stimulation during performance of SAQ - intensity 46mA with visual and palpable muscle contraction. Pt with 10sec off and 20sec on. After completion of e-stim, pt denies any negative side effects with skin intact and  no adverse side effects noted.    During first of treatment, pt instructed to perform SAQ bilaterally in order to attempt to increase activation to RLE. Quad activation noted with pulse on but no knee extension noted on RLE.  NMR performed for improvements in motor control and coordination, balance, sequencing, judgement, and self confidence/ efficacy in performing all aspects of mobility at highest level of independence.   NMR performed for improvements in motor control and coordination, balance, sequencing, judgement, and self confidence/ efficacy in performing all aspects of mobility at highest level of independence.   Patient supine in bed at end of session with brakes locked, bed alarm set, and all needs within reach.   Therapy Documentation Precautions:  Precautions Precautions: Fall Precaution Comments: R hemiplegia, slight "pusher", SBP <140 Restrictions Weight Bearing Restrictions: No  Pain:  L trapezius muscle pain d/t TP. Addressed with thermotherapy via MHP and improvement noted from pt.    Therapy/Group: Individual Therapy  Loel Dubonnet PT, DPT, CSRS 09/22/2023, 8:37 AM

## 2023-09-22 NOTE — Progress Notes (Signed)
Occupational Therapy Session Note  Patient Details  Name: Kyle [name] MRN: [id number] Date of Birth: 28-Sep-1951  Today's Date: 09/22/2023 OT Individual Time: 0830-0920 OT Individual Time Calculation (min): 50 min    Short Term Goals: Week 2:  OT Short Term Goal 1 (Week 2): pt will demonstrate improved sitting balance to sit upright without back support consistently for at least 10 minutes at a time to enable him to engage in sefl care tasks at EOB. OT Short Term Goal 2 (Week 2): Pt will don shirt with mod A demonstrating improved attention and awareness of RUE. OT Short Term Goal 3 (Week 2): Pt will complete squat pivots w/c >< bed or mat to L and to R consistently with max A of 1 to progress to completing toilet transfers in a safe manner (pt currently requires a stedy lift) OT Short Term Goal 4 (Week 2): Pt will demonstrate improved awareness of RUE by initiating reaching for R arm with L arm to prepare for bed mobility.  Skilled Therapeutic Interventions/Progress Updates:    Pt received in bed and requested to toilet.  +2 assist present from rehab tech.  Pt worked on rolling to left and pushing up to sit with heavy max A. Pt needed increased assist to sit on EOB due to leaning back and not able to focus on his posture.   Used stedy lift to transfer pt from bed to [facility] over toilet with heavy max A of 2 to support R arm and keep pt balanced as he was moving his trunk excessively and kept moving his left arm off of the stedy bar.  Pt was able to void bladder and bowel (documented in flow sheet). While sitting on toilet bathed with max A.  Total A with cleansing and LB clothing management. Pt having increased difficultly following directions to lean forward with sit to stands as he was pushing back.  Used verbal and tactile cues.  Once pt in w/c, he worked on [employer] dressing with max A today as he was having difficulty using his L arm due to new pain in arm.  (Informed MD in conference).  Pt asked to  wash his beard as he had food imbedded in beard, but pt only able to partially complete. Assisted him with full cleansing.  Positioned pt in tilt in space w/c with lap tray on to support BUEs.   Pt in room with all needs met, alarm in reach.   Therapy Documentation Precautions:  Precautions Precautions: Fall Precaution Comments: R hemiplegia, slight "pusher", SBP <140 Restrictions Weight Bearing Restrictions: No    Pain:  Pt c/o new L arm pain.  He felt he might have slept on his arm.  Applied heat packs to arm at end of session  ADL: ADL Equipment Provided:  (Stedyx2) Eating: Minimal assistance Where Assessed-Eating: Bed level Grooming: Minimal assistance Where Assessed-Grooming: Edge of bed Upper Body Bathing: Moderate assistance Where Assessed-Upper Body Bathing: Edge of bed Lower Body Bathing: Maximal assistance Where Assessed-Lower Body Bathing: Edge of bed Upper Body Dressing: Maximal assistance Where Assessed-Upper Body Dressing: Edge of bed Lower Body Dressing: Maximal assistance Where Assessed-Lower Body Dressing: Edge of bed Toileting: Dependent Where Assessed-Toileting: Bedside Commode (BSC over toilet) Toilet Transfer: Maximal assistance Toilet Transfer Method: Other (comment) (STEDY lift +2 A) Tub/Shower Transfer: Unable to assess Tub/Shower Transfer Method:  (UTA) Tub/Shower Equipment: Other (comment) (UTA) Walk-In Shower Transfer: Unable to assess Film/video editor Method: Unable to assess Industrial/product designer) Astronomer: Other (comment) (  UTA) ADL Comments: Patient inconsistent with unsupported sitting   Therapy/Group: Individual Therapy  Kyle [name] 09/22/2023, 12:23 PM

## 2023-09-22 NOTE — Progress Notes (Signed)
Physical Therapy Session Note  Patient Details  Name: Kyle [name] MRN: [id number] Date of Birth: [birth year]  Today's Date: 09/22/2023 PT Individual Time: 1022-1126 PT Individual Time Calculation (min): 64 min   Short Term Goals: Week 2:  PT Short Term Goal 1 (Week 2): Pt will roll side to side w/ mod A. PT Short Term Goal 2 (Week 2): Pt will transfer sup to sit w/ mod A. PT Short Term Goal 3 (Week 2): Pt will perform squat pivot transfer w/ mod A. PT Short Term Goal 4 (Week 2): Pt will ambulate 30 ft using LRAD with maxA. PT Short Term Goal 5 (Week 2): Pt will initiate w/c mobility.  Skilled Therapeutic Interventions/Progress Updates: Patient in TIS with wife present on entrance to room. Patient alert and agreeable to PT session.   Patient reported that L UE has been hurting since this morning, and states that he thinks that he slept on it wrong (care team made aware). Further investigation in main gym found a knot on L upper trap/area musculature with pt grimacing with light palpation (pt could not lift UE up to 90* without increased pain, and internal rotation + flexion produced more pain).   Pt required change of brief due to soiling it at beginning of session. NT assisted PTA in doing so. Pt performed squat pivot from TIS to EOB with maxA due to L UE in pain. Pt then transitioned to supine with maxA for time management. Pt cued to bring R LE into flexion at knee with PTA providing min/modA. PTA approximated R LE with L knee bent as well in order for pt to bridge hips to doff personal pants. Pt supine to R sidelying with minA, and supine to L with heavy modA (VC to flex knee on R to bridge hips over accordingly) all to doff/donn personal brief. PTA threaded personal pants through B LE with cues for pt to perform SLR with R LE with min/modA. Pt then cued to donn pants (up to thigh at this time) to waist, but required maxA to do so due to L UE pain. Pt supine to sit on EOB with modA to  advance B LE's (pt noted to advance R LE off of bed with increased activation of required musculature vs previous sessions). Pt then squat pivot to TIS with heavy mod/maxA. Pt transported from room<> main gym in TIS with PTA managing O2 tank and IV pole.   Manual Therapy: - Pt provided with heating pad for 6 minutes to relax L upper trap area as pt could not tolerate trigger point release without increased pain (pt and wife educated on rationale to providing this intervention as it can cause pain and decreased ROM due to impinging shoulder joint - pt and wife understood). PTA provided trigger point release (light at first, then slight increase in pressure with eventual note of slight release of knot, but pt could not tolerate much more and requested to stop for the time being). Pt provided with heat pack at end of session with further education that although it hurts as much as it does now to release, there is great benefit in doing so.   Patient sitting in TIS at end of session with brakes locked, wife present, belt alarm set, and all needs within reach.      Therapy Documentation Precautions:  Precautions Precautions: Fall Precaution Comments: R hemiplegia, slight "pusher", SBP <140 Restrictions Weight Bearing Restrictions: No  Therapy/Group: Individual Therapy  Dezerae Freiberger PTA 09/22/2023, 11:51  AM

## 2023-09-22 NOTE — Progress Notes (Signed)
PHARMACY - ANTICOAGULATION CONSULT NOTE  Pharmacy Consult for heparin  Indication: atrial fibrillation  No Known Allergies  Patient Measurements: Height: 5\' 11"  (180.3 cm) Weight: 75.8 kg (167 lb 1.7 oz) IBW/kg (Calculated) : 75.3 Heparin Dosing Weight: 76kg  Vital Signs: Temp: 98.2 F (36.8 C) (11/27 0408) Temp Source: Oral (11/27 0408) BP: 165/70 (11/27 0808) Pulse Rate: 78 (11/27 0808)  Labs: Recent Labs    09/20/23 0502 09/20/23 1247  HGB 12.3*  --   HCT 38.7*  --   PLT 222  --   CREATININE  --  2.00*    Estimated Creatinine Clearance: 36.1 mL/min (A) (by C-G formula based on SCr of 2 mg/dL (H)).   Medical History: Past Medical History:  Diagnosis Date   Aortic insufficiency    MODERATE WITH A BICUSPID AORTIC VAVLE   Arterial occlusive disease    MULTILEVEL   Atrial fibrillation (HCC)    CHF (congestive heart failure) (HCC)    Chronic bronchitis (HCC)    CKD (chronic kidney disease) stage 3, GFR 30-59 ml/min (HCC)    COPD (chronic obstructive pulmonary disease) (HCC)    Dilated cardiomyopathy (HCC)    WITH EJECTION FRACTION DOWN TO 20-25%--WITH CONGESTIVE HEART FAILURE   Edema    LOWER EXTREMETIES   Emphysema of lung (HCC)    Hypertension    Insulin dependent type 2 diabetes mellitus (HCC)    Normal coronary arteries 2009   Orthopnea    Peripheral arterial disease (HCC)    Pulmonary hypertension (HCC)     Medications:  Medications Prior to Admission  Medication Sig Dispense Refill Last Dose   apixaban (ELIQUIS) 5 MG TABS tablet Take 5 mg by mouth 2 (two) times daily.      acetaminophen (TYLENOL) 325 MG tablet Take 2 tablets (650 mg total) by mouth every 4 (four) hours as needed for mild pain (pain score 1-3) (or temp > 37.5 C (99.5 F)).      albuterol (PROVENTIL) (2.5 MG/3ML) 0.083% nebulizer solution Inhale 3 mLs into the lungs every 6 (six) hours as needed for wheezing or shortness of breath.      amLODipine (NORVASC) 10 MG tablet Take 1 tablet  (10 mg total) by mouth daily.      carvedilol (COREG) 6.25 MG tablet TAKE 1 TABLET TWICE DAILY WITH MEALS (NEED MD APPOINTMENT) 180 tablet 3    cloNIDine (CATAPRES) 0.2 MG tablet Take 1 tablet (0.2 mg total) by mouth every 8 (eight) hours.      fluticasone furoate-vilanterol (BREO ELLIPTA) 200-25 MCG/ACT AEPB Inhale 1 puff into the lungs daily.      furosemide (LASIX) 40 MG tablet TAKE 1/2 TABLET EVERY DAY 45 tablet 3    hydrALAZINE (APRESOLINE) 100 MG tablet Take 1 tablet (100 mg total) by mouth 3 (three) times daily.      isosorbide mononitrate (IMDUR) 30 MG 24 hr tablet TAKE 1 TABLET BY MOUTH ONCE DAILY. 90 tablet 1    levETIRAcetam (KEPPRA) 500 MG tablet Take 1 tablet (500 mg total) by mouth 2 (two) times daily.      Multiple Vitamins-Minerals (CENTRUM SILVER 50+MEN) TABS Take 1 tablet by mouth daily with breakfast.      rosuvastatin (CRESTOR) 10 MG tablet TAKE 1 TABLET AT BEDTIME 90 tablet 3    umeclidinium bromide (INCRUSE ELLIPTA) 62.5 MCG/ACT AEPB Inhale 1 puff into the lungs daily.      Scheduled:   amLODipine  10 mg Oral Daily   carvedilol  6.25 mg Oral  BID WC   cloNIDine  0.2 mg Oral Q8H   diclofenac Sodium  2 g Topical QID   fluticasone furoate-vilanterol  1 puff Inhalation Daily   And   umeclidinium bromide  1 puff Inhalation Daily   gabapentin  100 mg Oral QHS   hydrALAZINE  100 mg Oral TID   isosorbide mononitrate  30 mg Oral Daily   levETIRAcetam  500 mg Oral BID   mouth rinse  15 mL Mouth Rinse 4 times per day   pantoprazole  40 mg Oral Daily   rosuvastatin  10 mg Oral QHS   senna-docusate  2 tablet Oral Q breakfast   Infusions:   heparin      Assessment: Pt with a hx of AF on apixaban who was admitted after a fall with subsequent ICH. Repeat CT has been stable and heparin has been ordered for the next 48 hrs before resuming apixaban.  Hgb 12s Plt wnl Goal of Therapy:  Heparin level 0.3-0.5 units/ml Monitor platelets by anticoagulation protocol: Yes   Plan:   Heparin 1050 units/hr Check 8 hr HL Daily HL and CBC F/u resuming apixaban in 48 hrs  Ulyses Southward, PharmD, [place], AAHIVP, CPP Infectious Disease Pharmacist 09/22/2023 8:24 AM

## 2023-09-22 NOTE — Progress Notes (Signed)
PROGRESS NOTE   Subjective/Complaints: Appreciate neuro note  Reviewed CT head   ROS: Patient denies CP, SOB, N/V/D Objective:   CT HEAD WO CONTRAST ( )  Result Date: 09/21/2023 CLINICAL DATA:  Stroke, hemorrhagic EXAM: CT HEAD WITHOUT CONTRAST TECHNIQUE: Contiguous axial images were obtained from the base of the skull through the vertex without intravenous contrast. RADIATION DOSE REDUCTION: This exam was performed according to the departmental dose-optimization program which includes automated exposure control, adjustment of the mA and/or kV according to patient size and/or use of iterative reconstruction technique. COMPARISON:  Head CT 09/21/2023 FINDINGS: Brain: Interval decrease in size of the hemorrhage centered in the left lentiform nucleus with surrounding vasogenic edema. Unchanged minimal rightward midline shift. No new sites of hemorrhage. No CT evidence of an acute cortical infarct. No mass effect. No mass lesion. Vascular: No hyperdense vessel or unexpected calcification. Skull: Normal. Negative for fracture or focal lesion. Sinuses/Orbits: No middle ear or mastoid effusion. Paranasal sinuses are clear. Bilateral lens replacement. Orbits are otherwise unremarkable. Other: None. IMPRESSION: Interval decrease in size of the hemorrhage centered in the left lentiform nucleus with surrounding vasogenic edema. No new sites of hemorrhage identified. Electronically Signed   By: Lorenza Cambridge M.D.   On: 09/21/2023 09:01   DG HIP UNILAT WITH PELVIS 2-3 VIEWS RIGHT  Result Date: 09/20/2023 CLINICAL DATA:  72 year old male with right hip pain. EXAM: DG HIP (WITH OR WITHOUT PELVIS) 2-3V RIGHT COMPARISON:  CTA Chest, Abdomen, and Pelvis 09/06/2023. FINDINGS: AP pelvis, AP and frogleg lateral views of the right hip at 0940 hours. AP pelvis and hip views are mildly oblique to the right. Femoral heads remain normally located. Pelvis appears  intact. Nonobstructed bowel gas. Vascular calcifications in the pelvis. Right inguinal and right thigh surgical clips. Proximal right femur intact. No acute osseous abnormality identified. IMPRESSION: No acute osseous abnormality identified about the right hip or pelvis. Electronically Signed   By: Odessa Fleming M.D.   On: 09/20/2023 12:29   Recent Labs    09/20/23 0502  WBC 8.0  HGB 12.3*  HCT 38.7*  PLT 222    Recent Labs    09/20/23 1247  NA 143  K 4.0  CL 106  CO2 25  GLUCOSE 153*  BUN 29*  CREATININE 2.00*  CALCIUM 9.3    Intake/Output Summary (Last 24 hours) at 09/22/2023 0756 Last data filed at 09/22/2023 0756 Gross per 24 hour  Intake 417 ml  Output 200 ml  Net 217 ml         Physical Exam: Vital Signs Blood pressure (!) 163/63, pulse 73, temperature 98.2 F (36.8 C), temperature source Oral, resp. rate 18, height 5\' 11"  (1.803 m), weight 75.8 kg, SpO2 100%.  General: No acute distress Mood and affect are appropriate Heart: Regular rate and rhythm no rubs murmurs or extra sounds Lungs: Clear to auscultation, breathing unlabored, no rales or wheezes Abdomen: Positive bowel sounds, soft nontender to palpation, nondistended Extremities: No clubbing, cyanosis, or edema Skin: No evidence of breakdown, no evidence of rash, old incision Right hip   Musculoskeletal: Full range of motion in all 4 extremities. Pain with R hip ROM , reduced int  rotation , RLE warm , no erythema    PRIOR EXAMS: Neuro:  pt is alert and oriented. Follows basic commands. Fair insight and awareness. Speech with improved dysarthria. Right central 7. Antigravity and against resistance in left upper and lower extremities; flaccid right upper and lower extremity Flaccid tone - unchanged 11/27 From prior exams: RUE 0/5, RLE --trace HF/E and 0/5 KE/ADF/APF. Decreased LT RUE.     Assessment/Plan: 1. Functional deficits which require 3+ hours per day of interdisciplinary therapy in a  comprehensive inpatient rehab setting. Physiatrist is providing close team supervision and 24 hour management of active medical problems listed below. Physiatrist and rehab team continue to assess barriers to discharge/monitor patient progress toward functional and medical goals  Care Tool:  Bathing    Body parts bathed by patient: Right arm, Chest, Abdomen, Front perineal area, Right upper leg, Left upper leg, Face   Body parts bathed by helper: Right lower leg, Left lower leg, Buttocks, Front perineal area, Left arm     Bathing assist Assist Level: Maximal Assistance - Patient 24 - 49%     Upper Body Dressing/Undressing Upper body dressing   What is the patient wearing?: Pull over shirt    Upper body assist Assist Level: Minimal Assistance - Patient > 75%    Lower Body Dressing/Undressing Lower body dressing      What is the patient wearing?: Pants, Incontinence brief, Hospital gown only     Lower body assist Assist for lower body dressing: Maximal Assistance - Patient 25 - 49%     Toileting Toileting    Toileting assist Assist for toileting: Maximal Assistance - Patient 25 - 49%     Transfers Chair/bed transfer  Transfers assist     Chair/bed transfer assist level: Maximal Assistance - Patient 25 - 49%     Locomotion Ambulation   Ambulation assist   Ambulation activity did not occur: Safety/medical concerns          Walk 10 feet activity   Assist  Walk 10 feet activity did not occur: Safety/medical concerns        Walk 50 feet activity   Assist Walk 50 feet with 2 turns activity did not occur: Safety/medical concerns         Walk 150 feet activity   Assist Walk 150 feet activity did not occur: Safety/medical concerns         Walk 10 feet on uneven surface  activity   Assist Walk 10 feet on uneven surfaces activity did not occur: Safety/medical concerns         Wheelchair     Assist Is the patient using a  wheelchair?: Yes Type of Wheelchair: Manual    Wheelchair assist level: Dependent - Patient 0%      Wheelchair 50 feet with 2 turns activity    Assist        Assist Level: Dependent - Patient 0%   Wheelchair 150 feet activity     Assist      Assist Level: Dependent - Patient 0%   Blood pressure (!) 163/63, pulse 73, temperature 98.2 F (36.8 C), temperature source Oral, resp. rate 18, height 5\' 11"  (1.803 m), weight 75.8 kg, SpO2 100%.  Medical Problem List and Plan: 1. Functional deficits secondary to left basal ganglia ICH d/t hypertension/eliquis             -patient may shower             -ELOS/Goals: 10/07/23 goals min  assist with PT, OT, SLP             - resting WHO and PRAFO for right side pending  -Continue CIR therapies including PT, OT, and SLP    Repeat CT head- resolving ICH, will start IV heparin no bolus x 48h then transition to apixaban  2.  Antithrombotics: -DVT/anticoagulation:  Pharmaceutical:D/C  Lovenox 30mg              -antiplatelet therapy: N/A due to bleed 3. Pain Management: Tylenol prn. Voltaren gel QID.              -continue low dose gabapentin 100mg  at bedtime--dysesthetic pain improved 4. Mood/Behavior/Sleep: LCSW to follow for evaluation and support.              -antipsychotic agents: N/A 5. Neuropsych/cognition: This patient is capable of making decisions on his own behalf. 6. Skin/Wound Care: Routine pressure relief measures. 7. Fluids/Electrolytes/Nutrition: Monitor I/O. Check CMET in am, poor intake will need IVF              --added low salt restrictions. Add protein supplement    -push po   8. T2DM insulin dependent: Hgb A1c-6.2 on 09/06/23             -diet controlled -11//22 reasonable control -11-23: Mildly elevated, remaining less than 200, would consider sliding scale insulin but defer to primary team since this appears consistent with prior readings -09/19/23 no CBGs recently other than on labs yesterday; looks like  SSI/CBG checks were d/c'd 11/21; monitor as appropriate per primary   9. PAD s/o aortobifemoral bypass w/right pseudoaneurysm: SBP goal < 140.  --Plans for repair early December by Dr. Sherral Hammers? 10. COPD w/chronic bronchitis/emphysema: currently on 6 L oxygen. Titrate to keep sats > 90% per Dr. Vassie Loll.              --on Trelegy PTA --started on Breo/Incruse today.              --pulmonary toilet. Wean down to baseline as able.  11. CAF: Monitor HR TID. On coreg 6.25mg  BID. Off eliquis due to ICH- per Neuro may resume on 11/25 if stable --Neurology recommends resuming  Eliquis in 2 weeks if neuro and BP stable.    -09/22/23 HR controlled  Vitals:   09/22/23 0408 09/22/23 0718  BP: (!) 163/63   Pulse: 73   Resp: 18   Temp: 98.2 F (36.8 C)   SpO2: 100% 100%    12. Dilated cardiomyopathy/AS/chronic systolic CHF: Low salt diet. Check weight daily--up from 166-->173? --CXR 11/11 w/vascular congestion-->will order follow up CXR to check for fluid overload as cause of hypoxia.  --Monitor for signs of overload. On Lasix 20mg  daily, coreg 6.25mg  BID, Imdur 30mg  daily, hydralazine 100mg  TID -LVEF 55-60% -Weights stable , will d/c lasix due to elevated BUN/Creat  -11-23: Weight is down today, no signs of volume overload, no medication changes today.  -09/19/23 wt stable from yesterday, down from 2 days ago, no edema/overload appearance, cont holding lasix for now.  Filed Weights   09/20/23 0500 09/21/23 0500 09/22/23 0500  Weight: 74.8 kg 75.6 kg 75.8 kg    13. CKD: stage IIIb? -11/17 BUN/Cr stable at 33/1.82, 11/19 baseline creat likely in 1.6-1.8 range based on labs in system  Improving , fluid intake recorded at 11/18 but only 236 mL on 11/19, Creat up, hold lasix starting 11/21  11/22- no labs today, +350cc yesterday--continue to hold lasix 11-23: BUN and creatinine  remain elevated, unchanged from prior.  Output not recorded from last 2 days.  No signs of volume overload, continue to  hold Lasix.  Encourage p.o. fluids and recheck Monday.    Latest Ref Rng & Units 09/20/2023   12:47 PM 09/18/2023    4:27 AM 09/16/2023    5:21 AM  BMP  Glucose 70 - 99 mg/dL 130  865  784   BUN 8 - 23 mg/dL 29  34  36   Creatinine 0.61 - 1.24 mg/dL 6.96  2.95  2.84   Sodium 135 - 145 mmol/L 143  145  143   Potassium 3.5 - 5.1 mmol/L 4.0  3.9  3.9   Chloride 98 - 111 mmol/L 106  107  109   CO2 22 - 32 mmol/L 25  26  25    Calcium 8.9 - 10.3 mg/dL 9.3  9.2  8.8    BMET improving , creat stable no signs of fluid overload intake remains poor ( yesterday ) cont to hold lasix    14. Dysphagia: On D1, nectar- likely contributing to poor intake of liquids               -encourage fluids, aspiration precautions   -see above  15. HTN: on amlodipine 10mg  daily, coreg 6.25mg  BID, clonidine 0.2mg  q8h, hydralazine 100mg  TID  BP ok off furosemide  Vitals:   09/19/23 1216 09/19/23 1600 09/19/23 1932 09/20/23 0410  BP: 131/66 124/76 (!) 146/61 (!) 130/57   09/20/23 0851 09/20/23 1255 09/20/23 1257 09/20/23 2000  BP: (!) 125/56 124/63 124/63 (!) 150/70   09/21/23 0354 09/21/23 1436 09/21/23 1950 09/22/23 0408  BP: 138/61 (!) 123/58 130/66 (!) 163/63    16. Hx of seizure: continue keppra 500mg  BID  17. HLD: continue rosuvastatin 10mg  QHS  18.  Right hip pain suspect OA + immobility xray hip is unremarkable  LOS: 12 days A FACE TO FACE EVALUATION WAS PERFORMED  Erick Colace 09/22/2023, 7:56 AM

## 2023-09-22 NOTE — Plan of Care (Signed)
  Problem: Consults Goal: RH STROKE PATIENT EDUCATION Description: See Patient Education module for education specifics  Outcome: Progressing   Problem: RH BOWEL ELIMINATION Goal: RH STG MANAGE BOWEL WITH ASSISTANCE Description: STG Manage Bowel with toileting Assistance. Outcome: Progressing Goal: RH STG MANAGE BOWEL W/MEDICATION W/ASSISTANCE Description: STG Manage Bowel with Medication with mod I Assistance. Outcome: Progressing   Problem: RH SAFETY Goal: RH STG ADHERE TO SAFETY PRECAUTIONS W/ASSISTANCE/DEVICE Description: STG Adhere to Safety Precautions With cues Assistance/Device. Outcome: Progressing   Problem: RH KNOWLEDGE DEFICIT Goal: RH STG INCREASE KNOWLEDGE OF DIABETES Description: Patient and wife will be able to manage prediabetes with dietary modification using educational resources independently Outcome: Progressing Goal: RH STG INCREASE KNOWLEDGE OF HYPERTENSION Description: Patient and wife will be able to manage HTN with medications and dietary modification using educational resources independently Outcome: Progressing Goal: RH STG INCREASE KNOWLEDGE OF DYSPHAGIA/FLUID INTAKE Description: Patient and wife will be able to manage dysphagia, medications and dietary modification using educational resources independently Outcome: Progressing Goal: RH STG INCREASE KNOWLEGDE OF HYPERLIPIDEMIA Description: Patient and wife will be able to manage HLD with medications and dietary modification using educational resources independently Outcome: Progressing   Problem: Education: Goal: Ability to describe self-care measures that may prevent or decrease complications (Diabetes Survival Skills Education) will improve Outcome: Progressing Goal: Individualized Educational Video(s) Outcome: Progressing   Problem: Coping: Goal: Ability to adjust to condition or change in health will improve Outcome: Progressing   Problem: Fluid Volume: Goal: Ability to maintain a balanced  intake and output will improve Outcome: Progressing   Problem: Health Behavior/Discharge Planning: Goal: Ability to identify and utilize available resources and services will improve Outcome: Progressing Goal: Ability to manage health-related needs will improve Outcome: Progressing   Problem: Metabolic: Goal: Ability to maintain appropriate glucose levels will improve Outcome: Progressing   Problem: Nutritional: Goal: Maintenance of adequate nutrition will improve Outcome: Progressing Goal: Progress toward achieving an optimal weight will improve Outcome: Progressing   Problem: Skin Integrity: Goal: Risk for impaired skin integrity will decrease Outcome: Progressing   Problem: Tissue Perfusion: Goal: Adequacy of tissue perfusion will improve Outcome: Progressing

## 2023-09-22 NOTE — Progress Notes (Signed)
PHARMACY - ANTICOAGULATION CONSULT NOTE  Pharmacy Consult for heparin  Indication: atrial fibrillation  No Known Allergies  Patient Measurements: Height: 5\' 11"  (180.3 cm) Weight: 75.8 kg (167 lb 1.7 oz) IBW/kg (Calculated) : 75.3 Heparin Dosing Weight: 76kg  Vital Signs: BP: 132/58 (11/27 1356) Pulse Rate: 77 (11/27 1356)  Labs: Recent Labs    09/20/23 0502 09/20/23 1247 09/22/23 1750  HGB 12.3*  --   --   HCT 38.7*  --   --   PLT 222  --   --   HEPARINUNFRC  --   --  0.33  CREATININE  --  2.00*  --     Estimated Creatinine Clearance: 36.1 mL/min (A) (by C-G formula based on SCr of 2 mg/dL (H)).   Medical History: Past Medical History:  Diagnosis Date   Aortic insufficiency    MODERATE WITH A BICUSPID AORTIC VAVLE   Arterial occlusive disease    MULTILEVEL   Atrial fibrillation (HCC)    CHF (congestive heart failure) (HCC)    Chronic bronchitis (HCC)    CKD (chronic kidney disease) stage 3, GFR 30-59 ml/min (HCC)    COPD (chronic obstructive pulmonary disease) (HCC)    Dilated cardiomyopathy (HCC)    WITH EJECTION FRACTION DOWN TO 20-25%--WITH CONGESTIVE HEART FAILURE   Edema    LOWER EXTREMETIES   Emphysema of lung (HCC)    Hypertension    Insulin dependent type 2 diabetes mellitus (HCC)    Normal coronary arteries 2009   Orthopnea    Peripheral arterial disease (HCC)    Pulmonary hypertension (HCC)     Medications:  Medications Prior to Admission  Medication Sig Dispense Refill Last Dose   apixaban (ELIQUIS) 5 MG TABS tablet Take 5 mg by mouth 2 (two) times daily.      acetaminophen (TYLENOL) 325 MG tablet Take 2 tablets (650 mg total) by mouth every 4 (four) hours as needed for mild pain (pain score 1-3) (or temp > 37.5 C (99.5 F)).      albuterol (PROVENTIL) (2.5 MG/3ML) 0.083% nebulizer solution Inhale 3 mLs into the lungs every 6 (six) hours as needed for wheezing or shortness of breath.      amLODipine (NORVASC) 10 MG tablet Take 1 tablet (10 mg  total) by mouth daily.      carvedilol (COREG) 6.25 MG tablet TAKE 1 TABLET TWICE DAILY WITH MEALS (NEED MD APPOINTMENT) 180 tablet 3    cloNIDine (CATAPRES) 0.2 MG tablet Take 1 tablet (0.2 mg total) by mouth every 8 (eight) hours.      fluticasone furoate-vilanterol (BREO ELLIPTA) 200-25 MCG/ACT AEPB Inhale 1 puff into the lungs daily.      furosemide (LASIX) 40 MG tablet TAKE 1/2 TABLET EVERY DAY 45 tablet 3    hydrALAZINE (APRESOLINE) 100 MG tablet Take 1 tablet (100 mg total) by mouth 3 (three) times daily.      isosorbide mononitrate (IMDUR) 30 MG 24 hr tablet TAKE 1 TABLET BY MOUTH ONCE DAILY. 90 tablet 1    levETIRAcetam (KEPPRA) 500 MG tablet Take 1 tablet (500 mg total) by mouth 2 (two) times daily.      Multiple Vitamins-Minerals (CENTRUM SILVER 50+MEN) TABS Take 1 tablet by mouth daily with breakfast.      rosuvastatin (CRESTOR) 10 MG tablet TAKE 1 TABLET AT BEDTIME 90 tablet 3    umeclidinium bromide (INCRUSE ELLIPTA) 62.5 MCG/ACT AEPB Inhale 1 puff into the lungs daily.      Scheduled:   amLODipine  10 mg Oral Daily   carvedilol  6.25 mg Oral BID WC   cloNIDine  0.2 mg Oral Q8H   diclofenac Sodium  2 g Topical QID   fluticasone furoate-vilanterol  1 puff Inhalation Daily   And   umeclidinium bromide  1 puff Inhalation Daily   gabapentin  100 mg Oral QHS   hydrALAZINE  100 mg Oral TID   isosorbide mononitrate  30 mg Oral Daily   levETIRAcetam  500 mg Oral BID   mouth rinse  15 mL Mouth Rinse 4 times per day   pantoprazole  40 mg Oral Daily   rosuvastatin  10 mg Oral QHS   senna-docusate  2 tablet Oral Q breakfast   Infusions:   heparin 1,050 Units/hr (09/22/23 1009)    Assessment: Pt with a hx of AF on apixaban who was admitted after a fall with subsequent ICH. Repeat CT has been stable and heparin has been ordered for the next 48 hrs before resuming apixaban.  PM: heparin level 0.33 (therapeutic) on heparin 1050 units/hr. No issues with the infusion or bleeding  reported per RN.  Goal of Therapy:  Heparin level 0.3-0.5 units/ml Monitor platelets by anticoagulation protocol: Yes   Plan:  Continue Heparin 1050 units/hr Check confirmatory 8 hr HL Daily HL and CBC F/u resuming apixaban in 48 hrs  Loralee Pacas, PharmD, BCPS 09/22/2023 6:31 PM  Please check AMION for all [facility] Pharmacy phone numbers After 10:00 PM, call Main Pharmacy [phone]

## 2023-09-22 NOTE — Patient Care Conference (Signed)
Inpatient RehabilitationTeam Conference and Plan of Care Update Date: 09/22/2023   Time: 10:14 AM    Patient Name: Kyle [name]      Medical Record Number: [id number]  Date of Birth: [birth year] Sex: Male         Room/Bed: 4M09C/4M09C-01 Payor Info: Payor: [employer] MEDICARE / Plan: [employer] MEDICARE HMO / Product Type: *No Product type* /    Admit Date/Time:  09/10/2023  3:04 PM  Primary Diagnosis:  ICH (intracerebral hemorrhage) [facility])  Hospital Problems: Principal Problem:   ICH (intracerebral hemorrhage) (HCC)    Expected Discharge Date: Expected Discharge Date: 10/07/23  Team Members Present: Physician leading conference: Dr. Claudette Laws Social Worker Present: Lavera Guise, BSW Nurse Present: Chana Bode, RN PT Present: Ralph Leyden, PT OT Present: Primitivo Gauze, OT SLP Present: Everardo Pacific, SLP PPS Coordinator present : Fae Pippin, SLP     Current Status/Progress Goal Weekly Team Focus  Bowel/Bladder   incontinene of b/b   Regain continence   assist with time toileting q2-4, prn, qshift    Swallow/Nutrition/ Hydration   D1/NTL - continues to have anterior spillage, R buccal pocketing   sup A  continued education on swallowing strategies and pharyngeal exercises    ADL's   slow progressing but now mod A of 2 with transfers vs max A, min A UB bathing, mod A UB dressing, max LB self care and total A toileting   CGA with toilet transfers and standing during ADLS, min A shower transfers and LB dressing, bathing, and toileting   RUE NMR,. estim, balance, R side attention, ADL training, pt/family education    Mobility   Bed mobility = MaxA, Transfers = overall MaxA with stationary handhold to L, ambulation = 30 foot bouts at hallway HR with MaxA for R side facilitation   CGA - will need to be downgraded prior to discharge to potentially Min or ModA.  Barriers: improving impulsivity, continued lack of awareness, minimal R hemibody activation /// Work  on: R hemibody NMR, increasing motor control, estim for quad activation, transfers, standing/ sitting balance, overall awareness and connection between physical impairments and mobility, light family ed    Communication   up to 90% intelligible at word level given minA   sup A   implementation of compensatory strategies, focusing on /r/ and /l/ blends, cluster reduction    Safety/Cognition/ Behavioral Observations  minA for temporal orientation   sup A   use of external aids w/ sup A    Pain   no c/o pain  remain pain free   Assess qshift and prn    Skin   Skin intact   Maintain skin intergrity  assess qshift and prn      Discharge Planning:  Discharging home with spouse to primarily assist. Children to assist PRN on weekends. Barrier: Patient lives in 2nd level apartment. 15 steps to enter.   Team Discussion: Patient post ICH with HTN and Oxygen dependent PTA. Limited by impulsivity, poor trunk control poor carry over, decreased motivation and participation with pain in left arm and poor awareness of deficits.   Patient on target to meet rehab goals: no, currently note little progress  *See Care Plan and progress notes for long and short-term goals.   Revisions to Treatment Plan:  Heparin bridge to Eliquis post ICH Mirror therapy Teaching Needs: Safety, medications, transfers, toileting, skin care, etc.   Current Barriers to Discharge: Decreased caregiver support, Home enviroment access/layout, and Incontinence  Possible Resolutions to Barriers: Family  education HH follow up services DME: BSC, TTB     Medical Summary Current Status: Repeat CT head showing improvements , per RN pt may be depressed, Right hip pain  Barriers to Discharge: Behavior/Mood;Uncontrolled Hypertension;Other (comments)  Barriers to Discharge Comments: needs IV heparin prior to restarting apixaban Possible Resolutions to Barriers/Weekly Focus: Continue medication management, will need  wheelchair sitting evaluation and possible custom chair due to truncal weakness.  Monitor for seizures monitor for neuro decline on IV heparin, neuro psych  eval   Continued Need for Acute Rehabilitation Level of Care: The patient requires daily medical management by a physician with specialized training in physical medicine and rehabilitation for the following reasons: Direction of a multidisciplinary physical rehabilitation program to maximize functional independence : Yes Medical management of patient stability for increased activity during participation in an intensive rehabilitation regime.: Yes Analysis of laboratory values and/or radiology reports with any subsequent need for medication adjustment and/or medical intervention. : Yes   I attest that I was present, lead the team conference, and concur with the assessment and plan of the team.   Chana Bode B 09/22/2023, 1:38 PM

## 2023-09-22 NOTE — Progress Notes (Signed)
SLP Cancellation Note  Patient Details Name: Kyle [name] MRN: [id number] DOB: [birth year]   Cancelled treatment:      Initial 25 minutes of session missed due to toileting. Patient given suppository prior to session and requested to utilize bedpan. SLP/NT placed patient on bedpan and returned upon patient completion. Upon SLP return and patient completion of bowel movement, SLP attempted to initiate session. Patient unwilling to participate stating he has had "too much therapy today" and is "too tired." SLP educated patient on importance of participating in ST and diet trials. Patient continued to refuse. Patient left in bed with alarm set and call bell in reach. Therapist will attempt to make up missed minutes per therapy/patient schedule.                                                                                            Maximilien Hayashi M.A., CF-SLP 09/22/2023, 3:05 PM

## 2023-09-23 DIAGNOSIS — E46 Unspecified protein-calorie malnutrition: Secondary | ICD-10-CM

## 2023-09-23 LAB — CBC
HCT: 38.7 % — ABNORMAL LOW (ref 39.0–52.0)
Hemoglobin: 12.3 g/dL — ABNORMAL LOW (ref 13.0–17.0)
MCH: 29.3 pg (ref 26.0–34.0)
MCHC: 31.8 g/dL (ref 30.0–36.0)
MCV: 92.1 fL (ref 80.0–100.0)
Platelets: 223 10*3/uL (ref 150–400)
RBC: 4.2 MIL/uL — ABNORMAL LOW (ref 4.22–5.81)
RDW: 15.1 % (ref 11.5–15.5)
WBC: 9.9 10*3/uL (ref 4.0–10.5)
nRBC: 0 % (ref 0.0–0.2)

## 2023-09-23 LAB — PREALBUMIN: Prealbumin: 18 mg/dL (ref 18–38)

## 2023-09-23 LAB — HEPARIN LEVEL (UNFRACTIONATED): Heparin Unfractionated: 0.3 [IU]/mL (ref 0.30–0.70)

## 2023-09-23 MED ORDER — GLUCERNA SHAKE PO LIQD
237.0000 mL | Freq: Three times a day (TID) | ORAL | Status: DC
  Administered 2023-09-23 – 2023-09-24 (×4): 237 mL via ORAL

## 2023-09-23 NOTE — Progress Notes (Signed)
PROGRESS NOTE   Subjective/Complaints: Pt in bed. Slept well. No new complaints today. Denies any pain.  ROS: Patient denies fever, rash, sore throat, blurred vision, dizziness, nausea, vomiting, diarrhea, cough, shortness of breath or chest pain, joint or back/neck pain, headache, or mood change.   Objective:   CT HEAD WO CONTRAST ( )  Result Date: 09/21/2023 CLINICAL DATA:  Stroke, hemorrhagic EXAM: CT HEAD WITHOUT CONTRAST TECHNIQUE: Contiguous axial images were obtained from the base of the skull through the vertex without intravenous contrast. RADIATION DOSE REDUCTION: This exam was performed according to the departmental dose-optimization program which includes automated exposure control, adjustment of the mA and/or kV according to patient size and/or use of iterative reconstruction technique. COMPARISON:  Head CT 09/21/2023 FINDINGS: Brain: Interval decrease in size of the hemorrhage centered in the left lentiform nucleus with surrounding vasogenic edema. Unchanged minimal rightward midline shift. No new sites of hemorrhage. No CT evidence of an acute cortical infarct. No mass effect. No mass lesion. Vascular: No hyperdense vessel or unexpected calcification. Skull: Normal. Negative for fracture or focal lesion. Sinuses/Orbits: No middle ear or mastoid effusion. Paranasal sinuses are clear. Bilateral lens replacement. Orbits are otherwise unremarkable. Other: None. IMPRESSION: Interval decrease in size of the hemorrhage centered in the left lentiform nucleus with surrounding vasogenic edema. No new sites of hemorrhage identified. Electronically Signed   By: Lorenza Cambridge M.D.   On: 09/21/2023 09:01   Recent Labs    09/23/23 0425  WBC 9.9  HGB 12.3*  HCT 38.7*  PLT 223    Recent Labs    09/20/23 1247  NA 143  K 4.0  CL 106  CO2 25  GLUCOSE 153*  BUN 29*  CREATININE 2.00*  CALCIUM 9.3    Intake/Output Summary (Last 24  hours) at 09/23/2023 0840 Last data filed at 09/23/2023 0400 Gross per 24 hour  Intake 234 ml  Output 200 ml  Net 34 ml         Physical Exam: Vital Signs Blood pressure (!) 148/78, pulse 80, temperature 98.2 F (36.8 C), temperature source Oral, resp. rate 18, height 5\' 11"  (1.803 m), weight 68.6 kg, SpO2 100%.  Constitutional: No distress . Vital signs reviewed. HEENT: NCAT, EOMI, oral membranes moist Neck: supple Cardiovascular: RRR without murmur. No JVD    Respiratory/Chest: CTA Bilaterally without wheezes or rales. Normal effort    GI/Abdomen: BS +, non-tender, non-distended Ext: no clubbing, cyanosis, or edema Psych: pleasant and cooperative  Skin: Clean and dry. old incision Right hip   Musculoskeletal: Full range of motion in all 4 extremities. Pain with R hip ROM , reduced int rotation , RLE warm , no erythema  Neuro:  pt is alert and oriented. Follows basic commands. Fair insight and awareness. Speech with ongoing  dysarthria. Right central 7 remains. Antigravity and against resistance in left upper and lower extremities; flaccid right upper and lower extremity Flaccid tone - unchanged 11/28   RUE 0/5, RLE --trace HF/E and 0/5 KE/ADF/APF. Decreased light touch in  RUE.     Assessment/Plan: 1. Functional deficits which require 3+ hours per day of interdisciplinary therapy in a comprehensive inpatient rehab setting. Physiatrist is providing  close team supervision and 24 hour management of active medical problems listed below. Physiatrist and rehab team continue to assess barriers to discharge/monitor patient progress toward functional and medical goals  Care Tool:  Bathing    Body parts bathed by patient: Right arm, Chest, Abdomen, Front perineal area, Right upper leg, Left upper leg, Face   Body parts bathed by helper: Right lower leg, Left lower leg, Buttocks, Front perineal area, Left arm     Bathing assist Assist Level: Maximal Assistance - Patient 24 -  49%     Upper Body Dressing/Undressing Upper body dressing   What is the patient wearing?: Pull over shirt    Upper body assist Assist Level: Minimal Assistance - Patient > 75%    Lower Body Dressing/Undressing Lower body dressing      What is the patient wearing?: Pants, Incontinence brief, Hospital gown only     Lower body assist Assist for lower body dressing: Maximal Assistance - Patient 25 - 49%     Toileting Toileting    Toileting assist Assist for toileting: Total Assistance - Patient < 25%     Transfers Chair/bed transfer  Transfers assist     Chair/bed transfer assist level: Maximal Assistance - Patient 25 - 49%     Locomotion Ambulation   Ambulation assist   Ambulation activity did not occur: Safety/medical concerns          Walk 10 feet activity   Assist  Walk 10 feet activity did not occur: Safety/medical concerns        Walk 50 feet activity   Assist Walk 50 feet with 2 turns activity did not occur: Safety/medical concerns         Walk 150 feet activity   Assist Walk 150 feet activity did not occur: Safety/medical concerns         Walk 10 feet on uneven surface  activity   Assist Walk 10 feet on uneven surfaces activity did not occur: Safety/medical concerns         Wheelchair     Assist Is the patient using a wheelchair?: Yes Type of Wheelchair: Manual    Wheelchair assist level: Dependent - Patient 0%      Wheelchair 50 feet with 2 turns activity    Assist        Assist Level: Dependent - Patient 0%   Wheelchair 150 feet activity     Assist      Assist Level: Dependent - Patient 0%   Blood pressure (!) 148/78, pulse 80, temperature 98.2 F (36.8 C), temperature source Oral, resp. rate 18, height 5\' 11"  (1.803 m), weight 68.6 kg, SpO2 100%.  Medical Problem List and Plan: 1. Functional deficits secondary to left basal ganglia ICH d/t hypertension/eliquis             -patient may  shower             -ELOS/Goals: 10/07/23 goals min assist with PT, OT, SLP             - resting WHO and PRAFO    -Continue CIR therapies including PT, OT, and SLP       2.  Antithrombotics: -DVT/anticoagulation: pt started on IV heparin yesterday given resolving ICH. Continue for another ~24 hours then transition to eliquis 3. Pain Management: Tylenol prn. Voltaren gel QID.              -continue low dose gabapentin 100mg  at bedtime--dysesthetic pain improved 4. Mood/Behavior/Sleep: LCSW to  follow for evaluation and support.              -antipsychotic agents: N/A 5. Neuropsych/cognition: This patient is capable of making decisions on his own behalf. 6. Skin/Wound Care: Routine pressure relief measures. 7. Fluids/Electrolytes/Nutrition:               --added low salt restrictions. Add protein supplement    -intake remains inconsistent at best.   -check cmet and prealbumin tomorrow. Consult RD for ecs   8. T2DM insulin dependent: Hgb A1c-6.2 on 09/06/23             -diet controlled -CBG's not being checked  9. PAD s/o aortobifemoral bypass w/right pseudoaneurysm: SBP goal < 140.  --Plans for repair early December by Dr. Sherral Hammers? 10. COPD w/chronic bronchitis/emphysema: currently on 6 L oxygen. Titrate to keep sats > 90% per Dr. Vassie Loll.              --on Trelegy PTA --started on Breo/Incruse today.              --pulmonary toilet. Wean down to baseline as able.  11. CAF: Monitor HR TID. On coreg 6.25mg  BID. Off eliquis due to ICH- per Neuro may resume on 11/25 if stable --Neurology recommends resuming  Eliquis in 2 weeks if neuro and BP stable.    -09/23/23 HR well controlled  Vitals:   09/22/23 1943 09/23/23 0359  BP: 132/62 (!) 148/78  Pulse: 78 80  Resp: 15 18  Temp: 98.4 F (36.9 C) 98.2 F (36.8 C)  SpO2: 94% 100%    12. Dilated cardiomyopathy/AS/chronic systolic CHF: Low salt diet. Check weight daily--up from 166-->173? --CXR 11/11 w/vascular congestion-->will order follow  up CXR to check for fluid overload as cause of hypoxia.  --Monitor for signs of overload. On Lasix 20mg  daily, coreg 6.25mg  BID, Imdur 30mg  daily, hydralazine 100mg  TID -LVEF 55-60% -Weights stable , will d/c lasix due to elevated BUN/Creat  -weights trending down which I think is nutritional.  Filed Weights   09/21/23 0500 09/22/23 0500 09/23/23 0500  Weight: 75.6 kg 75.8 kg 68.6 kg    13. CKD: stage IIIb? -11/17 BUN/Cr stable at 33/1.82, 11/19 baseline creat likely in 1.6-1.8 range based on labs in system  Improving , fluid intake recorded at 11/18 but only 236 mL on 11/19, Creat up, hold lasix starting 11/21  11/22- no labs today, +350cc yesterday--continue to hold lasix 11-23: BUN and creatinine remain elevated, unchanged from prior.  Output not recorded from last 2 days.  No signs of volume overload, continue to hold Lasix.  Encourage p.o. fluids and recheck Monday.    Latest Ref Rng & Units 09/20/2023   12:47 PM 09/18/2023    4:27 AM 09/16/2023    5:21 AM  BMP  Glucose 70 - 99 mg/dL 528  413  244   BUN 8 - 23 mg/dL 29  34  36   Creatinine 0.61 - 1.24 mg/dL 0.10  2.72  5.36   Sodium 135 - 145 mmol/L 143  145  143   Potassium 3.5 - 5.1 mmol/L 4.0  3.9  3.9   Chloride 98 - 111 mmol/L 106  107  109   CO2 22 - 32 mmol/L 25  26  25    Calcium 8.9 - 10.3 mg/dL 9.3  9.2  8.8    BMET improving , creat stable no signs of fluid overload I    14. Dysphagia: On D1, nectar- likely contributing to poor intake  of liquids               -encourage fluids, aspiration precautions   -see above  15. HTN: on amlodipine 10mg  daily, coreg 6.25mg  BID, clonidine 0.2mg  q8h, hydralazine 100mg  TID  BP reasonable off furosemide  Vitals:   09/20/23 0851 09/20/23 1255 09/20/23 1257 09/20/23 2000  BP: (!) 125/56 124/63 124/63 (!) 150/70   09/21/23 0354 09/21/23 1436 09/21/23 1950 09/22/23 0408  BP: 138/61 (!) 123/58 130/66 (!) 163/63   09/22/23 0808 09/22/23 1356 09/22/23 1943 09/23/23 0359  BP:  (!) 165/70 (!) 132/58 132/62 (!) 148/78    16. Hx of seizure: continue keppra 500mg  BID  17. HLD: continue rosuvastatin 10mg  QHS  18.  Right hip pain suspect OA + immobility xray hip is unremarkable--stable    LOS: 13 days A FACE TO FACE EVALUATION WAS PERFORMED  Ranelle Oyster 09/23/2023, 8:40 AM

## 2023-09-23 NOTE — Plan of Care (Signed)
Problem: Consults Goal: RH STROKE PATIENT EDUCATION Description: See Patient Education module for education specifics  Outcome: Progressing   Problem: RH BOWEL ELIMINATION Goal: RH STG MANAGE BOWEL WITH ASSISTANCE Description: STG Manage Bowel with toileting Assistance. Outcome: Progressing Goal: RH STG MANAGE BOWEL W/MEDICATION W/ASSISTANCE Description: STG Manage Bowel with Medication with mod I Assistance. Outcome: Progressing   Problem: RH SAFETY Goal: RH STG ADHERE TO SAFETY PRECAUTIONS W/ASSISTANCE/DEVICE Description: STG Adhere to Safety Precautions With cues Assistance/Device. Outcome: Progressing   Problem: RH KNOWLEDGE DEFICIT Goal: RH STG INCREASE KNOWLEDGE OF DIABETES Description: Patient and wife will be able to manage prediabetes with dietary modification using educational resources independently Outcome: Progressing Goal: RH STG INCREASE KNOWLEDGE OF HYPERTENSION Description: Patient and wife will be able to manage HTN with medications and dietary modification using educational resources independently Outcome: Progressing Goal: RH STG INCREASE KNOWLEDGE OF DYSPHAGIA/FLUID INTAKE Description: Patient and wife will be able to manage dysphagia, medications and dietary modification using educational resources independently Outcome: Progressing Goal: RH STG INCREASE KNOWLEGDE OF HYPERLIPIDEMIA Description: Patient and wife will be able to manage HLD with medications and dietary modification using educational resources independently Outcome: Progressing   Problem: Education: Goal: Ability to describe self-care measures that may prevent or decrease complications (Diabetes Survival Skills Education) will improve Outcome: Progressing Goal: Individualized Educational Video(s) Outcome: Progressing   Problem: Coping: Goal: Ability to adjust to condition or change in health will improve Outcome: Progressing   Problem: Fluid Volume: Goal: Ability to maintain a balanced  intake and output will improve Outcome: Progressing   Problem: Health Behavior/Discharge Planning: Goal: Ability to identify and utilize available resources and services will improve Outcome: Progressing Goal: Ability to manage health-related needs will improve Outcome: Progressing   Problem: Metabolic: Goal: Ability to maintain appropriate glucose levels will improve Outcome: Progressing

## 2023-09-23 NOTE — Plan of Care (Signed)
  Problem: Consults Goal: RH STROKE PATIENT EDUCATION Description: See Patient Education module for education specifics  Outcome: Progressing   Problem: RH BOWEL ELIMINATION Goal: RH STG MANAGE BOWEL WITH ASSISTANCE Description: STG Manage Bowel with toileting Assistance. Outcome: Progressing Goal: RH STG MANAGE BOWEL W/MEDICATION W/ASSISTANCE Description: STG Manage Bowel with Medication with mod I Assistance. Outcome: Progressing   Problem: RH SAFETY Goal: RH STG ADHERE TO SAFETY PRECAUTIONS W/ASSISTANCE/DEVICE Description: STG Adhere to Safety Precautions With cues Assistance/Device. Outcome: Progressing   Problem: RH KNOWLEDGE DEFICIT Goal: RH STG INCREASE KNOWLEDGE OF DIABETES Description: Patient and wife will be able to manage prediabetes with dietary modification using educational resources independently Outcome: Progressing Goal: RH STG INCREASE KNOWLEDGE OF HYPERTENSION Description: Patient and wife will be able to manage HTN with medications and dietary modification using educational resources independently Outcome: Progressing Goal: RH STG INCREASE KNOWLEDGE OF DYSPHAGIA/FLUID INTAKE Description: Patient and wife will be able to manage dysphagia, medications and dietary modification using educational resources independently Outcome: Progressing Goal: RH STG INCREASE KNOWLEGDE OF HYPERLIPIDEMIA Description: Patient and wife will be able to manage HLD with medications and dietary modification using educational resources independently Outcome: Progressing   Problem: Education: Goal: Ability to describe self-care measures that may prevent or decrease complications (Diabetes Survival Skills Education) will improve Outcome: Progressing Goal: Individualized Educational Video(s) Outcome: Progressing   Problem: Coping: Goal: Ability to adjust to condition or change in health will improve Outcome: Progressing   Problem: Fluid Volume: Goal: Ability to maintain a balanced  intake and output will improve Outcome: Progressing   Problem: Health Behavior/Discharge Planning: Goal: Ability to identify and utilize available resources and services will improve Outcome: Progressing Goal: Ability to manage health-related needs will improve Outcome: Progressing   Problem: Metabolic: Goal: Ability to maintain appropriate glucose levels will improve Outcome: Progressing

## 2023-09-23 NOTE — Progress Notes (Signed)
PHARMACY - ANTICOAGULATION CONSULT NOTE  Pharmacy Consult for heparin  Indication: atrial fibrillation  No Known Allergies  Patient Measurements: Height: 5\' 11"  (180.3 cm) Weight: 68.6 kg (151 lb 3.8 oz) IBW/kg (Calculated) : 75.3 Heparin Dosing Weight: 76kg  Vital Signs: Temp: 98.2 F (36.8 C) (11/28 0359) Temp Source: Oral (11/28 0359) BP: 148/78 (11/28 0359) Pulse Rate: 80 (11/28 0359)  Labs: Recent Labs    09/20/23 1247 09/22/23 1750 09/23/23 0425  HGB  --   --  12.3*  HCT  --   --  38.7*  PLT  --   --  223  HEPARINUNFRC  --  0.33 0.30  CREATININE 2.00*  --   --     Estimated Creatinine Clearance: 32.9 mL/min (A) (by C-G formula based on SCr of 2 mg/dL (H)).   Medical History: Past Medical History:  Diagnosis Date   Aortic insufficiency    MODERATE WITH A BICUSPID AORTIC VAVLE   Arterial occlusive disease    MULTILEVEL   Atrial fibrillation (HCC)    CHF (congestive heart failure) (HCC)    Chronic bronchitis (HCC)    CKD (chronic kidney disease) stage 3, GFR 30-59 ml/min (HCC)    COPD (chronic obstructive pulmonary disease) (HCC)    Dilated cardiomyopathy (HCC)    WITH EJECTION FRACTION DOWN TO 20-25%--WITH CONGESTIVE HEART FAILURE   Edema    LOWER EXTREMETIES   Emphysema of lung (HCC)    Hypertension    Insulin dependent type 2 diabetes mellitus (HCC)    Normal coronary arteries 2009   Orthopnea    Peripheral arterial disease (HCC)    Pulmonary hypertension (HCC)     Medications:  Medications Prior to Admission  Medication Sig Dispense Refill Last Dose   apixaban (ELIQUIS) 5 MG TABS tablet Take 5 mg by mouth 2 (two) times daily.      acetaminophen (TYLENOL) 325 MG tablet Take 2 tablets (650 mg total) by mouth every 4 (four) hours as needed for mild pain (pain score 1-3) (or temp > 37.5 C (99.5 F)).      albuterol (PROVENTIL) (2.5 MG/3ML) 0.083% nebulizer solution Inhale 3 mLs into the lungs every 6 (six) hours as needed for wheezing or shortness  of breath.      amLODipine (NORVASC) 10 MG tablet Take 1 tablet (10 mg total) by mouth daily.      carvedilol (COREG) 6.25 MG tablet TAKE 1 TABLET TWICE DAILY WITH MEALS (NEED MD APPOINTMENT) 180 tablet 3    cloNIDine (CATAPRES) 0.2 MG tablet Take 1 tablet (0.2 mg total) by mouth every 8 (eight) hours.      fluticasone furoate-vilanterol (BREO ELLIPTA) 200-25 MCG/ACT AEPB Inhale 1 puff into the lungs daily.      furosemide (LASIX) 40 MG tablet TAKE 1/2 TABLET EVERY DAY 45 tablet 3    hydrALAZINE (APRESOLINE) 100 MG tablet Take 1 tablet (100 mg total) by mouth 3 (three) times daily.      isosorbide mononitrate (IMDUR) 30 MG 24 hr tablet TAKE 1 TABLET BY MOUTH ONCE DAILY. 90 tablet 1    levETIRAcetam (KEPPRA) 500 MG tablet Take 1 tablet (500 mg total) by mouth 2 (two) times daily.      Multiple Vitamins-Minerals (CENTRUM SILVER 50+MEN) TABS Take 1 tablet by mouth daily with breakfast.      rosuvastatin (CRESTOR) 10 MG tablet TAKE 1 TABLET AT BEDTIME 90 tablet 3    umeclidinium bromide (INCRUSE ELLIPTA) 62.5 MCG/ACT AEPB Inhale 1 puff into the lungs daily.  Scheduled:   amLODipine  10 mg Oral Daily   carvedilol  6.25 mg Oral BID WC   cloNIDine  0.2 mg Oral Q8H   diclofenac Sodium  2 g Topical QID   fluticasone furoate-vilanterol  1 puff Inhalation Daily   And   umeclidinium bromide  1 puff Inhalation Daily   gabapentin  100 mg Oral QHS   hydrALAZINE  100 mg Oral TID   isosorbide mononitrate  30 mg Oral Daily   levETIRAcetam  500 mg Oral BID   mouth rinse  15 mL Mouth Rinse 4 times per day   pantoprazole  40 mg Oral Daily   rosuvastatin  10 mg Oral QHS   senna-docusate  2 tablet Oral Q breakfast   Infusions:   heparin 1,050 Units/hr (09/23/23 0835)    Assessment: Pt with a hx of AF on apixaban who was admitted after a fall with subsequent ICH. Repeat CT has been stable and heparin has been ordered for the next 48 hrs before resuming apixaban.  Heparin level is therapeutic this AM  but on the lower side. Will increase slightly and check level in AM. F/u with transition to apixaban tomorrow.   Goal of Therapy:  Heparin level 0.3-0.5 units/ml Monitor platelets by anticoagulation protocol: Yes   Plan:  Increase Heparin 1100 units/hr Daily HL and CBC F/u resuming apixaban in 48 hrs  Ulyses Southward, PharmD, [place], AAHIVP, CPP Infectious Disease Pharmacist 09/23/2023 8:45 AM

## 2023-09-24 LAB — CBC
HCT: 39.7 % (ref 39.0–52.0)
Hemoglobin: 12.4 g/dL — ABNORMAL LOW (ref 13.0–17.0)
MCH: 28.8 pg (ref 26.0–34.0)
MCHC: 31.2 g/dL (ref 30.0–36.0)
MCV: 92.3 fL (ref 80.0–100.0)
Platelets: 227 10*3/uL (ref 150–400)
RBC: 4.3 MIL/uL (ref 4.22–5.81)
RDW: 15.2 % (ref 11.5–15.5)
WBC: 8.3 10*3/uL (ref 4.0–10.5)
nRBC: 0 % (ref 0.0–0.2)

## 2023-09-24 LAB — COMPREHENSIVE METABOLIC PANEL
ALT: 21 U/L (ref 0–44)
AST: 18 U/L (ref 15–41)
Albumin: 2.8 g/dL — ABNORMAL LOW (ref 3.5–5.0)
Alkaline Phosphatase: 49 U/L (ref 38–126)
Anion gap: 9 (ref 5–15)
BUN: 29 mg/dL — ABNORMAL HIGH (ref 8–23)
CO2: 23 mmol/L (ref 22–32)
Calcium: 8.8 mg/dL — ABNORMAL LOW (ref 8.9–10.3)
Chloride: 112 mmol/L — ABNORMAL HIGH (ref 98–111)
Creatinine, Ser: 1.89 mg/dL — ABNORMAL HIGH (ref 0.61–1.24)
GFR, Estimated: 37 mL/min — ABNORMAL LOW (ref >60)
Glucose, Bld: 152 mg/dL — ABNORMAL HIGH (ref 70–99)
Potassium: 3.9 mmol/L (ref 3.5–5.1)
Sodium: 144 mmol/L (ref 135–145)
Total Bilirubin: 0.6 mg/dL (ref <1.2)
Total Protein: 6.7 g/dL (ref 6.5–8.1)

## 2023-09-24 LAB — HEPARIN LEVEL (UNFRACTIONATED): Heparin Unfractionated: 0.36 [IU]/mL (ref 0.30–0.70)

## 2023-09-24 MED ORDER — ENSURE ENLIVE PO LIQD
237.0000 mL | Freq: Three times a day (TID) | ORAL | Status: DC
  Administered 2023-09-24 – 2023-10-06 (×29): 237 mL via ORAL

## 2023-09-24 MED ORDER — APIXABAN 5 MG PO TABS
5.0000 mg | ORAL_TABLET | Freq: Two times a day (BID) | ORAL | Status: DC
  Administered 2023-09-24 – 2023-10-06 (×24): 5 mg via ORAL
  Filled 2023-09-24 (×24): qty 1

## 2023-09-24 MED ORDER — MIRTAZAPINE 15 MG PO TABS
7.5000 mg | ORAL_TABLET | Freq: Every day | ORAL | Status: DC
  Administered 2023-09-24 – 2023-10-05 (×12): 7.5 mg via ORAL
  Filled 2023-09-24 (×12): qty 1

## 2023-09-24 NOTE — Progress Notes (Addendum)
PHARMACY - ANTICOAGULATION CONSULT NOTE  Pharmacy Consult for heparin>apixaban Indication: atrial fibrillation  No Known Allergies  Patient Measurements: Height: 5\' 11"  (180.3 cm) Weight: 74.5 kg (164 lb 3.9 oz) IBW/kg (Calculated) : 75.3 Heparin Dosing Weight: 76kg  Vital Signs: Temp: 98.5 F (36.9 C) (11/29 0509) BP: 139/60 (11/29 0509) Pulse Rate: 68 (11/29 0509)  Labs: Recent Labs    09/22/23 1750 09/23/23 0425 09/24/23 0355  HGB  --  12.3* 12.4*  HCT  --  38.7* 39.7  PLT  --  223 227  HEPARINUNFRC 0.33 0.30 0.36  CREATININE  --   --  1.89*    Estimated Creatinine Clearance: 37.8 mL/min (A) (by C-G formula based on SCr of 1.89 mg/dL (H)).   Medical History: Past Medical History:  Diagnosis Date   Aortic insufficiency    MODERATE WITH A BICUSPID AORTIC VAVLE   Arterial occlusive disease    MULTILEVEL   Atrial fibrillation (HCC)    CHF (congestive heart failure) (HCC)    Chronic bronchitis (HCC)    CKD (chronic kidney disease) stage 3, GFR 30-59 ml/min (HCC)    COPD (chronic obstructive pulmonary disease) (HCC)    Dilated cardiomyopathy (HCC)    WITH EJECTION FRACTION DOWN TO 20-25%--WITH CONGESTIVE HEART FAILURE   Edema    LOWER EXTREMETIES   Emphysema of lung (HCC)    Hypertension    Insulin dependent type 2 diabetes mellitus (HCC)    Normal coronary arteries 2009   Orthopnea    Peripheral arterial disease (HCC)    Pulmonary hypertension (HCC)     Medications:  Medications Prior to Admission  Medication Sig Dispense Refill Last Dose   apixaban (ELIQUIS) 5 MG TABS tablet Take 5 mg by mouth 2 (two) times daily.      acetaminophen (TYLENOL) 325 MG tablet Take 2 tablets (650 mg total) by mouth every 4 (four) hours as needed for mild pain (pain score 1-3) (or temp > 37.5 C (99.5 F)).      albuterol (PROVENTIL) (2.5 MG/3ML) 0.083% nebulizer solution Inhale 3 mLs into the lungs every 6 (six) hours as needed for wheezing or shortness of breath.       amLODipine (NORVASC) 10 MG tablet Take 1 tablet (10 mg total) by mouth daily.      carvedilol (COREG) 6.25 MG tablet TAKE 1 TABLET TWICE DAILY WITH MEALS (NEED MD APPOINTMENT) 180 tablet 3    cloNIDine (CATAPRES) 0.2 MG tablet Take 1 tablet (0.2 mg total) by mouth every 8 (eight) hours.      fluticasone furoate-vilanterol (BREO ELLIPTA) 200-25 MCG/ACT AEPB Inhale 1 puff into the lungs daily.      furosemide (LASIX) 40 MG tablet TAKE 1/2 TABLET EVERY DAY 45 tablet 3    hydrALAZINE (APRESOLINE) 100 MG tablet Take 1 tablet (100 mg total) by mouth 3 (three) times daily.      isosorbide mononitrate (IMDUR) 30 MG 24 hr tablet TAKE 1 TABLET BY MOUTH ONCE DAILY. 90 tablet 1    levETIRAcetam (KEPPRA) 500 MG tablet Take 1 tablet (500 mg total) by mouth 2 (two) times daily.      Multiple Vitamins-Minerals (CENTRUM SILVER 50+MEN) TABS Take 1 tablet by mouth daily with breakfast.      rosuvastatin (CRESTOR) 10 MG tablet TAKE 1 TABLET AT BEDTIME 90 tablet 3    umeclidinium bromide (INCRUSE ELLIPTA) 62.5 MCG/ACT AEPB Inhale 1 puff into the lungs daily.      Scheduled:   amLODipine  10 mg Oral Daily  carvedilol  6.25 mg Oral BID WC   cloNIDine  0.2 mg Oral Q8H   diclofenac Sodium  2 g Topical QID   feeding supplement (GLUCERNA SHAKE)  237 mL Oral TID BM   fluticasone furoate-vilanterol  1 puff Inhalation Daily   And   umeclidinium bromide  1 puff Inhalation Daily   gabapentin  100 mg Oral QHS   hydrALAZINE  100 mg Oral TID   isosorbide mononitrate  30 mg Oral Daily   levETIRAcetam  500 mg Oral BID   mouth rinse  15 mL Mouth Rinse 4 times per day   pantoprazole  40 mg Oral Daily   rosuvastatin  10 mg Oral QHS   senna-docusate  2 tablet Oral Q breakfast   Infusions:   heparin 1,100 Units/hr (09/23/23 0919)    Assessment: Pt with a hx of AF on apixaban who was admitted after a fall with subsequent ICH. Repeat CT has been stable and heparin has been ordered for the next 48 hrs before resuming  apixaban.  Heparin level is therapeutic. F/u with transition to apixaban today.   Addendum  Ok to transition back to apixaban this PM per Dr. Riley Kill  Goal of Therapy:  Monitor platelets by anticoagulation protocol: Yes   Plan:  Continue heparin 1100 units/hr until 6pm Resume apixaban 5mg  BID at 6pm Rx will follow peripherally  Ulyses Southward, PharmD, BCIDP, AAHIVP, CPP Infectious Disease Pharmacist 09/24/2023 7:28 AM

## 2023-09-24 NOTE — Progress Notes (Addendum)
Speech Language Pathology Weekly Progress and Session Note  Patient Details  Name: Kyle [name] MRN: [id number] Date of Birth: [birth year]  Beginning of progress report period: September 17, 2023 End of progress report period: September 24, 2023  Today's Date: 09/24/2023 SLP Individual Time: 0900-1000 SLP Individual Time Calculation (min): 60 min  Short Term Goals: Week 2: SLP Short Term Goal 1 (Week 2): Patient will utilize swallowing strategies during consumpution of D1/NTL diet to reduce s/sx of aspiration with min multimodal A SLP Short Term Goal 1 - Progress (Week 2): Progressing toward goal SLP Short Term Goal 2 (Week 2): Patient will participate in dysphagia treatment to demonstrate a functional change in oropharyngeal swallow given minA multimodal SLP Short Term Goal 2 - Progress (Week 2): Met SLP Short Term Goal 3 (Week 2): Patient will increase speech intelligibility to 80% at the word level given min multimodal A SLP Short Term Goal 3 - Progress (Week 2): Progressing toward goal SLP Short Term Goal 4 (Week 2): Patient will demonstrate orientation to time given modi multimodal A SLP Short Term Goal 4 - Progress (Week 2): Met    New Short Term Goals: Week 3: SLP Short Term Goal 1 (Week 3): Patient will utilize swallowing strategies during consumpution of D1/NTL diet to reduce s/sx of aspiration with min multimodal A SLP Short Term Goal 2 (Week 3): Patient will participate in dysphagia treatment to demonstrate a functional change in oropharyngeal swallow given supervisionA multimodal SLP Short Term Goal 3 (Week 3): Patient will increase speech intelligibility to 80% at the word level given min multimodal A  Weekly Progress Updates: Pt has made fair gains and has met 2 of 4 STGs this reporting period due to improved dysphagia and orientation. Currently, patient continues to require mod A for use of swallowing and speech compensatory strategies. Patient continues to be ~70%  intelligible at the word level with use of strategies. Of note, patient inconsistently willing to participate in therapy, therefore hindering progress. Although patient continues to demonstrate poor carryover of strategies, he is willing to participate in oropharyngeal swallowing exercises given minA. Patient now able to orient to time with modi cues, therefore meeting this LTG.  Pt/family eduction ongoing. Pt would benefit from continued ST intervention to maximize communication and dysphagia in order to maximize functional independence at d/c.    Intensity: Minumum of 1-2 x/day, 30 to 90 minutes Frequency: 3 to 5 out of 7 days Duration/Length of Stay: 12/12 Treatment/Interventions: Cognitive remediation/compensation;Internal/external aids;Speech/Language facilitation;Cueing hierarchy;Therapeutic Activities;Functional tasks;Multimodal communication approach;Patient/family education;Therapeutic Exercise   Daily Session Skilled Therapeutic Interventions:  Skilled therapy session focused on dysphagia and communication goals. SLP addressed dysphagia through providing minA for completion of 10 CTAR and supraglottic swallow repetitions. After completion of exercises, SLP completed oral care and encouraged patient to consume trials of D1 textures. Patient continues to present with mild anterior spillage and buccal pocketing despite minA to utilize strategies. Patient refused to utilize mirror to view anterior spillage stating he "does not want to see it." SLP provided education regarding risk of aspiration due to buccal pocketing.  Patient with x1 cough which may indicative of possible bolus misdirection. Recommend repeat MBS to re-assess oropharyngeal swallow before d/c. SLP addressed communication through review of speech intelligibility strategies. SLP provided modA during initial /r/ and /l/ single words. Patient with 70% intelligibility at the single word level. Wife present at the end of the session and  educated on current patient progress and goals. Patient left in bed with alarm  set and call bell in reach.      Pain None verbalized   Therapy/Group: Individual Therapy  Glendia Olshefski M.A., CF-SLP 09/24/2023, 12:15 PM

## 2023-09-24 NOTE — Progress Notes (Addendum)
Initial Nutrition Assessment  DOCUMENTATION CODES:   Not applicable  INTERVENTION:   Change supplement to Ensure Enlive po TID for more kcal and protein, each supplement provides 350 kcal and 20 grams of protein. Add Magic cup TID with meals, each supplement provides 290 kcal and 9 grams of protein. Mighty Shake TID with meals, each supplement provides 330 kcals and 9 grams of protein. Calorie count x 48 hours.  If intake remains poor, consider placing Cortrak tube for supplemental nutrition support.   NUTRITION DIAGNOSIS:   Inadequate oral intake related to dysphagia as evidenced by meal completion < 50%.  GOAL:   Patient will meet greater than or equal to 90% of their needs  MONITOR:   PO intake, Supplement acceptance  REASON FOR ASSESSMENT:   Consult Assessment of nutrition requirement/status, Poor PO  ASSESSMENT:   72 yo male admitted with functional deficits d/t ICH. PMH includes aortic insufficiency, HTN, dilated cardiomyopathy, CHF, COPD, arterial occlusive disease, PAD, pulmonary HTN, CKD, emphysema of lung, A fib, DM-2 insulin dependent.  Patient working with PT during RD visit; unable to complete NFPE at this time. Spoke with his wife at bedside who reports patient is eating okay. PT reports that patient holds liquids in his mouth. He is currently on nectar thick liquids. RN thickens the Glucerna shake to nectar consistency and he drank it this morning. Patient would benefit from a more concentrated nutrition shake with more protein and calories than Glucerna to help meet his nutrition needs since PO intake of meals is so low. RD will change supplement to Ensure Enlive (350 kcal and 20 gm protein each).  Patient with variable intake of meals since admission to rehab. Mostly </= 25% meal completions documented with a few greater than that. Average intake for the past 8 documented meals is 36%. MD ordered Remeron and a calorie count to begin today.   Labs reviewed.    Medications reviewed and include Keppra, Protonix, Senokot-S, Remeron.  Recent weight encounters from the past year reviewed. No significant weight changes noted.  NUTRITION - FOCUSED PHYSICAL EXAM:  Unable to complete  Diet Order:   Diet Order             DIET - DYS 1 Room service appropriate? Yes with Assist; Fluid consistency: Nectar Thick  Diet effective now                   EDUCATION NEEDS:   Not appropriate for education at this time  Skin:  Skin Assessment: Reviewed RN Assessment  Last BM:  11/27 type 5  Height:   Ht Readings from Last 1 Encounters:  09/10/23 5\' 11"  (1.803 m)    Weight:   Wt Readings from Last 1 Encounters:  09/24/23 74.5 kg    Ideal Body Weight:  78.2 kg  BMI:  Body mass index is 22.91 kg/m.  Estimated Nutritional Needs:   Kcal:  2000-2200  Protein:  100-115 gm  Fluid:  2-2.2 L   Gabriel Rainwater RD, LDN, CNSC Please refer to Amion for contact information.

## 2023-09-24 NOTE — Progress Notes (Signed)
Physical Therapy Session Note  Patient Details  Name: Kyle [name] MRN: [id number] Date of Birth: [birth year]  Today's Date: 09/24/2023 PT Individual Time: 1032-1132 PT Individual Time Calculation (min): 60 min   Short Term Goals: Week 1:  PT Short Term Goal 1 (Week 1): Pt will roll side to side w/ mod A. PT Short Term Goal 1 - Progress (Week 1): Progressing toward goal PT Short Term Goal 2 (Week 1): Pt will transfer sup to sit w/ mod A. PT Short Term Goal 2 - Progress (Week 1): Progressing toward goal PT Short Term Goal 3 (Week 1): Pt will perform squat pivot transfer w/ mod A. PT Short Term Goal 3 - Progress (Week 1): Progressing toward goal Week 2:  PT Short Term Goal 1 (Week 2): Pt will roll side to side w/ mod A. PT Short Term Goal 2 (Week 2): Pt will transfer sup to sit w/ mod A. PT Short Term Goal 3 (Week 2): Pt will perform squat pivot transfer w/ mod A. PT Short Term Goal 4 (Week 2): Pt will ambulate 30 ft using LRAD with maxA. PT Short Term Goal 5 (Week 2): Pt will initiate w/c mobility.  Skilled Therapeutic Interventions/Progress Updates:  Patient supine in bed on entrance to room. Patient alert and agreeable to PT session.   Patient with no pain complaint at start of session.  Therapeutic Activity: Bed Mobility: Pt performed supine > sit with MaxA and significant cues for sequencing technique for log roll and then MaxA to push up to seated position on EOB. Follows instructions well.  Requires time and MaxA intermittently to maintain seated balance. Is able to scoot forward to bring feet to floor with some improvement in sitting balance. +2 available for up to MinA for sitting balance while this therapist dons shoes with TotA. VC/ tc required for attn to maintaining seated balance.  Transfers: squat pivot from bed > w/c with improving effort and MaxA this day especially for power up. Once in w/c, pt relates need to toilet. STEDY used for transfer to toilet with MaxA and +2  for steadying. Requires MaxA for pericare, sit<>stands in STEDY, and clothing mgmt.    Pt performed sit<>stand  in STEDY requiring MaxA and vc for technique/ effort. Requires MaxA+2 for pericare and clothing mgmt as well as to maintin upright posture and balance. Continues to demonstrate impulsive desire to assist with unaffected UE despite need for LUE to assist with upright balance. LOB to R side corrected with Max/ TotA.   Gait Training:  Pt ambulated 30' x1/ 34' x1 using L hallway HR with maxA overall. Required MaxA to advance RLE as well as to block/ stabilize knee during stance phase. Requires facilitation with vc for lateral weight shift, forward hip/ pelvic translation, knee extension with block during stance phase and heavy heel/ foot strike for improved sensation through increased joint approximation at end of TotA swing phase.  Provided vc/ tc for sequencing throughout. Continues to demonstrate improved sequencing following first amb bout.   Patient supine in bed at end of session with brakes locked, bed alarm set, and all needs within reach.   Therapy Documentation Precautions:  Precautions Precautions: Fall Precaution Comments: R hemiplegia, slight "pusher", SBP <140 Restrictions Weight Bearing Restrictions: No General:   Vital Signs:   Pain: Pain Assessment Pain Scale: 0-10 Pain Score: 0-No pain related this session.    Therapy/Group: Individual Therapy  Loel Dubonnet PT, DPT, CSRS 09/24/2023, 1:01 PM

## 2023-09-24 NOTE — Progress Notes (Signed)
Occupational Therapy Session Note  Patient Details  Name: Kyle [name] MRN: [id number] Date of Birth: 1951/02/01  Today's Date: 09/24/2023 OT Individual Time: 1420-1508 OT Individual Time Calculation (min): 48 min  and Today's Date: 09/24/2023 OT Missed Time: 27 Minutes Missed Time Reason: Patient fatigue   Short Term Goals: Week 2:  OT Short Term Goal 1 (Week 2): pt will demonstrate improved sitting balance to sit upright without back support consistently for at least 10 minutes at a time to enable him to engage in sefl care tasks at EOB. OT Short Term Goal 2 (Week 2): Pt will don shirt with mod A demonstrating improved attention and awareness of RUE. OT Short Term Goal 3 (Week 2): Pt will complete squat pivots w/c >< bed or mat to L and to R consistently with max A of 1 to progress to completing toilet transfers in a safe manner (pt currently requires a stedy lift) OT Short Term Goal 4 (Week 2): Pt will demonstrate improved awareness of RUE by initiating reaching for R arm with L arm to prepare for bed mobility.  Skilled Therapeutic Interventions/Progress Updates:  Skilled OT intervention completed with focus on sitting balance, visual scanning and midline orientation. Pt received upright in bed asleep, easily woken and agreeable to session. Rt hemi body discomfort with bed mobility reported; pre-medicated. OT offered rest breaks and repositioning throughout for pain reduction.  Pt received on 4.5L O2 via [place] with sat at 92% per order. Titrated pt to 4L to observe changes in vitals, however pt dropped to 80% with activity therefore returned pt to 4.5L and sat back up to 90%. Remained on 4.5 L at conclusion of session.  Pt verbalized fatigue from earlier session but willing to sit EOB for therapy activities. Transitioned with max A for RLE and trunk elevation, with pt seeking HHA vs rolling towards side. Pt initially required mod A to sit EOB due to Rt lean, however improved to CGA/close  supervision with static sitting but still CGA/min A with dynamic tasks with even greater when either leg was lifted from floor I.e. donning shoes. Donned R GRAFO and Lt shoe with total A, and mod A for sitting balance to provide strong BOS for mobility.  Utilized STEDY to stand with mod A required with Rt lean and mild pushing noted in stance, with use of mirror for visual feedback. Pt with poor control of RLE and pt locking Lt knee in stance requiring cues and guarding to softly bend Lt knee in order to sit in perched position. Attempted to have pt complete task from perched position for proprioceptive input of RLE and for challenge with midline however pt did not tolerate very well, expressing fatigue and request to get out of. Stood from perched position with mod A and again Rt lean, and mod A to sit on EOB. Kept STEDY in front of pt for the following tasks: -using LUE to place squigz on long mirror to challenge postural strength and dynamic sitting balance; no difficulty with task -using LUE to use marker to connect the squigz as called out by therapist to promote visual scanning and Rt attention needed for BADLs; missed a few when scanning -using LUE to wipe down mirror with wash cloth for postural strength and endurance  Pt expressed continued fatigue, therefore returned pt supine in bed with max A, and +2 to boost towards HOB. Donned R PRAFO and RUE resting hand splint with pillow provided with slight shoulder abduction for hemi positioning. Pt  remained semi upright in bed, with bed alarm on/activated, and with all needs in reach at end of session. Pt missed 27 mins of OT intervention secondary to fatigue; OT will make up missed time as able.    Therapy Documentation Precautions:  Precautions Precautions: Fall Precaution Comments: R hemiplegia, slight "pusher", SBP <140 Restrictions Weight Bearing Restrictions: No    Therapy/Group: Individual Therapy  Melvyn Novas, MS,  OTR/L  09/24/2023, 3:23 PM

## 2023-09-24 NOTE — Plan of Care (Signed)
Goals adjusted due to inconsistent patient participation Problem: RH Swallowing Goal: LTG Patient will consume least restrictive diet using compensatory strategies with assistance (SLP) Description: LTG:  Patient will consume least restrictive diet using compensatory strategies with assistance (SLP) Flowsheets (Taken 09/24/2023 1220) LTG: Pt Patient will consume least restrictive diet using compensatory strategies with assistance of (SLP): Minimal Assistance - Patient > 75% Goal: LTG Pt will demonstrate functional change in swallow as evidenced by bedside/clinical objective assessment (SLP) Description: LTG: Patient will demonstrate functional change in swallow as evidenced by bedside/clinical objective assessment (SLP) Flowsheets (Taken 09/11/2023 1122) LTG: Patient will demonstrate functional change in swallow as evidenced by bedside/clinical objective assessment: Oropharyngeal swallow   Problem: RH Expression Communication Goal: LTG Patient will increase speech intelligibility (SLP) Description: LTG: Patient will increase speech intelligibility at word/phrase/conversation level with cues, % of the time (SLP) Flowsheets (Taken 09/24/2023 1220) LTG: Patient will increase speech intelligibility (SLP): Minimal Assistance - Patient > 75%

## 2023-09-24 NOTE — Progress Notes (Addendum)
PROGRESS NOTE   Subjective/Complaints: Pt in good spirits. [place] Spaniel he told wife to stay home yesterday for "a break". Slept ok last night.   ROS: Patient denies fever, rash, sore throat, blurred vision, dizziness, nausea, vomiting, diarrhea, cough, shortness of breath or chest pain, joint or back/neck pain, headache, or mood change.   Objective:   No results found. Recent Labs    09/23/23 0425 09/24/23 0355  WBC 9.9 8.3  HGB 12.3* 12.4*  HCT 38.7* 39.7  PLT 223 227    Recent Labs    09/24/23 0355  NA 144  K 3.9  CL 112*  CO2 23  GLUCOSE 152*  BUN 29*  CREATININE 1.89*  CALCIUM 8.8*    Intake/Output Summary (Last 24 hours) at 09/24/2023 1018 Last data filed at 09/24/2023 0819 Gross per 24 hour  Intake 739.6 ml  Output --  Net 739.6 ml         Physical Exam: Vital Signs Blood pressure 139/60, pulse 68, temperature 98.5 F (36.9 C), resp. rate 15, height 5\' 11"  (1.803 m), weight 74.5 kg, SpO2 94%.  Constitutional: No distress . Vital signs reviewed. HEENT: NCAT, EOMI, oral membranes moist Neck: supple Cardiovascular: RRR without murmur. No JVD    Respiratory/Chest: CTA Bilaterally without wheezes or rales. Normal effort    GI/Abdomen: BS +, non-tender, non-distended Ext: no clubbing, cyanosis, or edema Psych: pleasant and cooperative  Skin: Clean and dry. old incision Right hip  Musculoskeletal: Full range of motion in all 4 extremities. Pain with R hip ROM , reduced int rotation , RLE warm , no erythema  Neuro:  pt is alert and oriented. Follows basic commands. Fair insight and awareness. Speech with ongoing  dysarthria. Right central 7 remains. Antigravity and against resistance in left upper and lower extremities; flaccid right upper and lower extremity Flaccid tone -  RUE 0/5, RLE --trace HF/E and 0/5 KE/ADF/APF. Decreased light touch in  RUE. Neuro exam unchanged 11/29    Assessment/Plan: 1.  Functional deficits which require 3+ hours per day of interdisciplinary therapy in a comprehensive inpatient rehab setting. Physiatrist is providing close team supervision and 24 hour management of active medical problems listed below. Physiatrist and rehab team continue to assess barriers to discharge/monitor patient progress toward functional and medical goals  Care Tool:  Bathing    Body parts bathed by patient: Right arm, Chest, Abdomen, Front perineal area, Right upper leg, Left upper leg, Face   Body parts bathed by helper: Right lower leg, Left lower leg, Buttocks, Front perineal area, Left arm     Bathing assist Assist Level: Maximal Assistance - Patient 24 - 49%     Upper Body Dressing/Undressing Upper body dressing   What is the patient wearing?: Pull over shirt    Upper body assist Assist Level: Minimal Assistance - Patient > 75%    Lower Body Dressing/Undressing Lower body dressing      What is the patient wearing?: Pants, Incontinence brief, Hospital gown only     Lower body assist Assist for lower body dressing: Maximal Assistance - Patient 25 - 49%     Toileting Toileting    Toileting assist Assist for toileting:  Total Assistance - Patient < 25%     Transfers Chair/bed transfer  Transfers assist     Chair/bed transfer assist level: Maximal Assistance - Patient 25 - 49%     Locomotion Ambulation   Ambulation assist   Ambulation activity did not occur: Safety/medical concerns          Walk 10 feet activity   Assist  Walk 10 feet activity did not occur: Safety/medical concerns        Walk 50 feet activity   Assist Walk 50 feet with 2 turns activity did not occur: Safety/medical concerns         Walk 150 feet activity   Assist Walk 150 feet activity did not occur: Safety/medical concerns         Walk 10 feet on uneven surface  activity   Assist Walk 10 feet on uneven surfaces activity did not occur: Safety/medical  concerns         Wheelchair     Assist Is the patient using a wheelchair?: Yes Type of Wheelchair: Manual    Wheelchair assist level: Dependent - Patient 0%      Wheelchair 50 feet with 2 turns activity    Assist        Assist Level: Dependent - Patient 0%   Wheelchair 150 feet activity     Assist      Assist Level: Dependent - Patient 0%   Blood pressure 139/60, pulse 68, temperature 98.5 F (36.9 C), resp. rate 15, height 5\' 11"  (1.803 m), weight 74.5 kg, SpO2 94%.  Medical Problem List and Plan: 1. Functional deficits secondary to left basal ganglia ICH d/t hypertension/eliquis             -patient may shower             -ELOS/Goals: 10/07/23 goals min assist with PT, OT, SLP             - resting WHO and PRAFO    -Continue CIR therapies including PT, OT, and SLP       2.  Antithrombotics: -DVT/anticoagulation: heparin> eliquis.  11/29. Spoke with pharmacy. begin eliquis today 3. Pain Management: Tylenol prn. Voltaren gel QID.              -continue low dose gabapentin 100mg  at bedtime--dysesthetic pain improved 4. Mood/Behavior/Sleep: LCSW to follow for evaluation and support.              -antipsychotic agents: N/A 5. Neuropsych/cognition: This patient is capable of making decisions on his own behalf. 6. Skin/Wound Care: Routine pressure relief measures. 7. Fluids/Electrolytes/Nutrition:               --added low salt restrictions. Add protein supplement    -intake remains inconsistent at best. D1/ Nectar diet obviously doesn't help  -11/29 reviewed labs. Prealbumin low at 18. Encouraging pt to improve PO   -RD consulted also. I ordered calorie count today   -will begin remeron at HS to help with appetite   -Electrolytes and renal fxn probably close to baseline   -labs ordered for monday   8. T2DM insulin dependent: Hgb A1c-6.2 on 09/06/23             -diet controlled -CBG's not being checked  9. PAD s/o aortobifemoral bypass w/right  pseudoaneurysm: SBP goal < 140.  --Plans for repair early December by Dr. Sherral Hammers? 10. COPD w/chronic bronchitis/emphysema: currently on 6 L oxygen. Titrate to keep sats > 90% per Dr. Vassie Loll.              --  on Trelegy PTA --started on Breo/Incruse today.              --pulmonary toilet. Weaning down to baseline as able.  11. CAF: Monitor HR TID. On coreg 6.25mg  BID. Off eliquis due to ICH- per Neuro may resume on 11/25 if stable --Neurology recommends resuming  Eliquis in 2 weeks if neuro and BP stable.    -09/23/23 HR well controlled  Vitals:   09/23/23 1913 09/24/23 0509  BP: (!) 122/52 139/60  Pulse: 75 68  Resp: 19 15  Temp: 98.7 F (37.1 C) 98.5 F (36.9 C)  SpO2: 100% 94%    12. Dilated cardiomyopathy/AS/chronic systolic CHF: Low salt diet. Check weight daily--up from 166-->173? --CXR 11/11 w/vascular congestion-->will order follow up CXR to check for fluid overload as cause of hypoxia.  --Monitor for signs of overload. On Lasix 20mg  daily, coreg 6.25mg  BID, Imdur 30mg  daily, hydralazine 100mg  TID -LVEF 55-60% -Weights stable , will d/c lasix due to elevated BUN/Creat  11/29 -weights generally trending down which I think is nutritional.  Filed Weights   09/22/23 0500 09/23/23 0500 09/24/23 0500  Weight: 75.8 kg 68.6 kg 74.5 kg    13. CKD: stage IIIb? -11/17 BUN/Cr stable at 33/1.82, 11/19 baseline creat likely in 1.6-1.8 range based on labs in system  Improving , fluid intake recorded at 11/18 but only 236 mL on 11/19, Creat up, hold lasix starting 11/21  11/22- no labs today, +350cc yesterday--continue to hold lasix 11-23: BUN and creatinine remain elevated, unchanged from prior.  Output not recorded from last 2 days.  No signs of volume overload, continue to hold Lasix.  Encourage p.o. fluids and recheck Monday.    Latest Ref Rng & Units 09/24/2023    3:55 AM 09/20/2023   12:47 PM 09/18/2023    4:27 AM  BMP  Glucose 70 - 99 mg/dL 161  096  045   BUN 8 - 23 mg/dL  29  29  34   Creatinine 0.61 - 1.24 mg/dL 4.09  8.11  9.14   Sodium 135 - 145 mmol/L 144  143  145   Potassium 3.5 - 5.1 mmol/L 3.9  4.0  3.9   Chloride 98 - 111 mmol/L 112  106  107   CO2 22 - 32 mmol/L 23  25  26    Calcium 8.9 - 10.3 mg/dL 8.8  9.3  9.2    78/29 Creat stable no signs of fluid overload I think he might be close to baseline   14. Dysphagia: On D1, nectar- likely contributing to poor intake of liquids               -encourage fluids, aspiration precautions   -see above  15. HTN: on amlodipine 10mg  daily, coreg 6.25mg  BID, clonidine 0.2mg  q8h, hydralazine 100mg  TID  BP reasonable off furosemide  Vitals:   09/20/23 2000 09/21/23 0354 09/21/23 1436 09/21/23 1950  BP: (!) 150/70 138/61 (!) 123/58 130/66   09/22/23 0408 09/22/23 0808 09/22/23 1356 09/22/23 1943  BP: (!) 163/63 (!) 165/70 (!) 132/58 132/62   09/23/23 0359 09/23/23 1231 09/23/23 1913 09/24/23 0509  BP: (!) 148/78 (!) 125/55 (!) 122/52 139/60    16. Hx of seizure: continue keppra 500mg  BID  17. HLD: continue rosuvastatin 10mg  QHS  18.  Right hip pain suspect OA + immobility xray hip is unremarkable--stable    LOS: 14 days A FACE TO FACE EVALUATION WAS PERFORMED  Ranelle Oyster 09/24/2023, 10:18 AM

## 2023-09-25 LAB — CBC
HCT: 39.8 % (ref 39.0–52.0)
Hemoglobin: 12.5 g/dL — ABNORMAL LOW (ref 13.0–17.0)
MCH: 29.1 pg (ref 26.0–34.0)
MCHC: 31.4 g/dL (ref 30.0–36.0)
MCV: 92.8 fL (ref 80.0–100.0)
Platelets: 249 10*3/uL (ref 150–400)
RBC: 4.29 MIL/uL (ref 4.22–5.81)
RDW: 15 % (ref 11.5–15.5)
WBC: 7.3 10*3/uL (ref 4.0–10.5)
nRBC: 0 % (ref 0.0–0.2)

## 2023-09-25 NOTE — Progress Notes (Signed)
PROGRESS NOTE   Subjective/Complaints:  Pt doing well today, slept ok, denies pain, reports LBM was yesterday but none documented since 11/27; urinating fine per pt. Denies any other complaints or concerns.   ROS: as per HPI. Denies CP, SOB, abd pain, N/V/D/C, or any other complaints at this time.    Objective:   No results found. Recent Labs    09/24/23 0355 09/25/23 0509  WBC 8.3 7.3  HGB 12.4* 12.5*  HCT 39.7 39.8  PLT 227 249    Recent Labs    09/24/23 0355  NA 144  K 3.9  CL 112*  CO2 23  GLUCOSE 152*  BUN 29*  CREATININE 1.89*  CALCIUM 8.8*    Intake/Output Summary (Last 24 hours) at 09/25/2023 1403 Last data filed at 09/25/2023 0700 Gross per 24 hour  Intake 437.73 ml  Output --  Net 437.73 ml         Physical Exam: Vital Signs Blood pressure (!) 147/61, pulse 80, temperature 98.5 F (36.9 C), resp. rate 17, height 5\' 11"  (1.803 m), weight 74.9 kg, SpO2 95%.  Constitutional: No distress . Vital signs reviewed. Laying in bed HEENT: NCAT, EOMI, oral membranes moist Neck: supple Cardiovascular: RRR without murmur. No JVD    Respiratory/Chest: CTA Bilaterally without wheezes or rales. Normal effort    GI/Abdomen: soft, BS +, non-tender, non-distended Ext: no clubbing, cyanosis, or edema Psych: pleasant and cooperative  PRIOR EXAMS:  Skin: Clean and dry. old incision Right hip  Musculoskeletal: Full range of motion in all 4 extremities. Pain with R hip ROM , reduced int rotation , RLE warm , no erythema  Neuro:  pt is alert and oriented. Follows basic commands. Fair insight and awareness. Speech with ongoing  dysarthria. Right central 7 remains. Antigravity and against resistance in left upper and lower extremities; flaccid right upper and lower extremity Flaccid tone -  RUE 0/5, RLE --trace HF/E and 0/5 KE/ADF/APF. Decreased light touch in  RUE. Neuro exam unchanged  11/29    Assessment/Plan: 1. Functional deficits which require 3+ hours per day of interdisciplinary therapy in a comprehensive inpatient rehab setting. Physiatrist is providing close team supervision and 24 hour management of active medical problems listed below. Physiatrist and rehab team continue to assess barriers to discharge/monitor patient progress toward functional and medical goals  Care Tool:  Bathing    Body parts bathed by patient: Right arm, Chest, Abdomen, Front perineal area, Right upper leg, Left upper leg, Face   Body parts bathed by helper: Right lower leg, Left lower leg, Buttocks, Front perineal area, Left arm     Bathing assist Assist Level: Maximal Assistance - Patient 24 - 49%     Upper Body Dressing/Undressing Upper body dressing   What is the patient wearing?: Pull over shirt    Upper body assist Assist Level: Minimal Assistance - Patient > 75%    Lower Body Dressing/Undressing Lower body dressing      What is the patient wearing?: Pants, Incontinence brief, Hospital gown only     Lower body assist Assist for lower body dressing: Maximal Assistance - Patient 25 - 49%     Toileting Toileting  Toileting assist Assist for toileting: Total Assistance - Patient < 25%     Transfers Chair/bed transfer  Transfers assist     Chair/bed transfer assist level: Maximal Assistance - Patient 25 - 49%     Locomotion Ambulation   Ambulation assist   Ambulation activity did not occur: Safety/medical concerns          Walk 10 feet activity   Assist  Walk 10 feet activity did not occur: Safety/medical concerns        Walk 50 feet activity   Assist Walk 50 feet with 2 turns activity did not occur: Safety/medical concerns         Walk 150 feet activity   Assist Walk 150 feet activity did not occur: Safety/medical concerns         Walk 10 feet on uneven surface  activity   Assist Walk 10 feet on uneven surfaces  activity did not occur: Safety/medical concerns         Wheelchair     Assist Is the patient using a wheelchair?: Yes Type of Wheelchair: Manual    Wheelchair assist level: Dependent - Patient 0%      Wheelchair 50 feet with 2 turns activity    Assist        Assist Level: Dependent - Patient 0%   Wheelchair 150 feet activity     Assist      Assist Level: Dependent - Patient 0%   Blood pressure (!) 147/61, pulse 80, temperature 98.5 F (36.9 C), resp. rate 17, height 5\' 11"  (1.803 m), weight 74.9 kg, SpO2 95%.  Medical Problem List and Plan: 1. Functional deficits secondary to left basal ganglia ICH d/t hypertension/eliquis             -patient may shower             -ELOS/Goals: 10/07/23 goals min assist with PT, OT, SLP             - resting WHO and PRAFO    -Continue CIR therapies including PT, OT, and SLP       2.  Antithrombotics: -DVT/anticoagulation: heparin> eliquis.  11/29. Spoke with pharmacy. begin eliquis today 3. Pain Management: Tylenol prn. Voltaren gel QID.              -continue low dose gabapentin 100mg  at bedtime--dysesthetic pain improved 4. Mood/Behavior/Sleep: LCSW to follow for evaluation and support.              -antipsychotic agents: N/A 5. Neuropsych/cognition: This patient is capable of making decisions on his own behalf. 6. Skin/Wound Care: Routine pressure relief measures. 7. Fluids/Electrolytes/Nutrition:               --added low salt restrictions. Add protein supplement    -intake remains inconsistent at best. D1/ Nectar diet obviously doesn't help  -11/29 reviewed labs. Prealbumin low at 18. Encouraging pt to improve PO   -RD consulted also. I ordered calorie count today   -will begin remeron at HS to help with appetite   -Electrolytes and renal fxn probably close to baseline   -labs ordered for monday   8. T2DM insulin dependent: Hgb A1c-6.2 on 09/06/23             -diet controlled -CBG's not being checked   9.  PAD s/o aortobifemoral bypass w/right pseudoaneurysm: SBP goal < 140.  --Plans for repair early December by Dr. Sherral Hammers? 10. COPD w/chronic bronchitis/emphysema: currently on 6 L oxygen. Titrate to  keep sats > 90% per Dr. Vassie Loll.              --on Trelegy PTA --started on Breo/Incruse today.              --pulmonary toilet. Weaning down to baseline as able.  11. CAF: Monitor HR TID. On coreg 6.25mg  BID. Off eliquis due to ICH- per Neuro may resume on 11/25 if stable --Neurology recommends resuming  Eliquis in 2 weeks if neuro and BP stable.    -09/25/23 HR well controlled  Vitals:   09/24/23 1953 09/25/23 0426  BP: (!) 137/58 (!) 147/61  Pulse: 62 80  Resp: 16 17  Temp: 98 F (36.7 C) 98.5 F (36.9 C)  SpO2: 97% 95%    12. Dilated cardiomyopathy/AS/chronic systolic CHF: Low salt diet. Check weight daily--up from 166-->173? --CXR 11/11 w/vascular congestion-->will order follow up CXR to check for fluid overload as cause of hypoxia.  --Monitor for signs of overload. On Lasix 20mg  daily, coreg 6.25mg  BID, Imdur 30mg  daily, hydralazine 100mg  TID -LVEF 55-60% -Weights stable , will d/c lasix due to elevated BUN/Creat  11/29 -weights generally trending down which I think is nutritional.  Filed Weights   09/23/23 0500 09/24/23 0500 09/25/23 0500  Weight: 68.6 kg 74.5 kg 74.9 kg    13. CKD: stage IIIb? -11/17 BUN/Cr stable at 33/1.82, 11/19 baseline creat likely in 1.6-1.8 range based on labs in system  Improving , fluid intake recorded at 11/18 but only 236 mL on 11/19, Creat up, hold lasix starting 11/21  11/22- no labs today, +350cc yesterday--continue to hold lasix 11-23: BUN and creatinine remain elevated, unchanged from prior.  Output not recorded from last 2 days.  No signs of volume overload, continue to hold Lasix.  Encourage p.o. fluids and recheck Monday.    Latest Ref Rng & Units 09/24/2023    3:55 AM 09/20/2023   12:47 PM 09/18/2023    4:27 AM  BMP  Glucose 70 - 99  mg/dL 403  474  259   BUN 8 - 23 mg/dL 29  29  34   Creatinine 0.61 - 1.24 mg/dL 5.63  8.75  6.43   Sodium 135 - 145 mmol/L 144  143  145   Potassium 3.5 - 5.1 mmol/L 3.9  4.0  3.9   Chloride 98 - 111 mmol/L 112  106  107   CO2 22 - 32 mmol/L 23  25  26    Calcium 8.9 - 10.3 mg/dL 8.8  9.3  9.2    32/95 Creat stable no signs of fluid overload I think he might be close to baseline   14. Dysphagia: On D1, nectar- likely contributing to poor intake of liquids               -encourage fluids, aspiration precautions   -see above  15. HTN: on amlodipine 10mg  daily, coreg 6.25mg  BID, clonidine 0.2mg  q8h, hydralazine 100mg  TID  BP reasonable off furosemide   -09/25/23 BPs stable, monitor Vitals:   09/22/23 0408 09/22/23 0808 09/22/23 1356 09/22/23 1943  BP: (!) 163/63 (!) 165/70 (!) 132/58 132/62   09/23/23 0359 09/23/23 1231 09/23/23 1913 09/24/23 0509  BP: (!) 148/78 (!) 125/55 (!) 122/52 139/60   09/24/23 1307 09/24/23 1953 09/24/23 1953 09/25/23 0426  BP: (!) 121/54 (!) 137/58 (!) 137/58 (!) 147/61    16. Hx of seizure: continue keppra 500mg  BID  17. HLD: continue rosuvastatin 10mg  QHS  18.  Right hip pain suspect OA +  immobility xray hip is unremarkable--stable    LOS: 15 days A FACE TO FACE EVALUATION WAS PERFORMED  [address] 09/25/2023, 2:03 PM

## 2023-09-26 DIAGNOSIS — K5901 Slow transit constipation: Secondary | ICD-10-CM

## 2023-09-26 MED ORDER — SENNOSIDES-DOCUSATE SODIUM 8.6-50 MG PO TABS
2.0000 | ORAL_TABLET | Freq: Two times a day (BID) | ORAL | Status: DC
  Administered 2023-09-26 – 2023-09-29 (×7): 2 via ORAL
  Filled 2023-09-26 (×7): qty 2

## 2023-09-26 NOTE — Progress Notes (Signed)
PROGRESS NOTE   Subjective/Complaints:  Pt doing ok today, slept well, denies pain, reports LBM was yesterday but none documented since 11/27-- states "it might have been small" with reference to his reported BM yesterday; urinating fine per pt. Denies any other complaints or concerns.   ROS: as per HPI. Denies CP, SOB, abd pain, N/V/D/C, or any other complaints at this time.    Objective:   No results found. Recent Labs    09/24/23 0355 09/25/23 0509  WBC 8.3 7.3  HGB 12.4* 12.5*  HCT 39.7 39.8  PLT 227 249    Recent Labs    09/24/23 0355  NA 144  K 3.9  CL 112*  CO2 23  GLUCOSE 152*  BUN 29*  CREATININE 1.89*  CALCIUM 8.8*    Intake/Output Summary (Last 24 hours) at 09/26/2023 0947 Last data filed at 09/25/2023 1700 Gross per 24 hour  Intake 353 ml  Output --  Net 353 ml         Physical Exam: Vital Signs Blood pressure (!) 147/65, pulse 78, temperature 98.2 F (36.8 C), temperature source Oral, resp. rate 18, height 5\' 11"  (1.803 m), weight 74.8 kg, SpO2 97%.  Constitutional: No distress . Vital signs reviewed. Sitting up in bed eating HEENT: NCAT, EOMI, oral membranes moist Neck: supple Cardiovascular: RRR without murmur. No JVD    Respiratory/Chest: CTA Bilaterally without wheezes or rales. Normal effort    GI/Abdomen: soft, BS +, non-tender, non-distended Ext: no clubbing, cyanosis, or edema Psych: pleasant and cooperative  PRIOR EXAMS:  Skin: Clean and dry. old incision Right hip  Musculoskeletal: Full range of motion in all 4 extremities. Pain with R hip ROM , reduced int rotation , RLE warm , no erythema  Neuro:  pt is alert and oriented. Follows basic commands. Fair insight and awareness. Speech with ongoing  dysarthria. Right central 7 remains. Antigravity and against resistance in left upper and lower extremities; flaccid right upper and lower extremity Flaccid tone -  RUE 0/5, RLE  --trace HF/E and 0/5 KE/ADF/APF. Decreased light touch in  RUE. Neuro exam unchanged 11/29    Assessment/Plan: 1. Functional deficits which require 3+ hours per day of interdisciplinary therapy in a comprehensive inpatient rehab setting. Physiatrist is providing close team supervision and 24 hour management of active medical problems listed below. Physiatrist and rehab team continue to assess barriers to discharge/monitor patient progress toward functional and medical goals  Care Tool:  Bathing    Body parts bathed by patient: Right arm, Chest, Abdomen, Front perineal area, Right upper leg, Left upper leg, Face   Body parts bathed by helper: Right lower leg, Left lower leg, Buttocks, Front perineal area, Left arm     Bathing assist Assist Level: Maximal Assistance - Patient 24 - 49%     Upper Body Dressing/Undressing Upper body dressing   What is the patient wearing?: Pull over shirt    Upper body assist Assist Level: Minimal Assistance - Patient > 75%    Lower Body Dressing/Undressing Lower body dressing      What is the patient wearing?: Pants, Incontinence brief, Hospital gown only     Lower body assist Assist  for lower body dressing: Maximal Assistance - Patient 25 - 49%     Toileting Toileting    Toileting assist Assist for toileting: Total Assistance - Patient < 25%     Transfers Chair/bed transfer  Transfers assist     Chair/bed transfer assist level: Maximal Assistance - Patient 25 - 49%     Locomotion Ambulation   Ambulation assist   Ambulation activity did not occur: Safety/medical concerns          Walk 10 feet activity   Assist  Walk 10 feet activity did not occur: Safety/medical concerns        Walk 50 feet activity   Assist Walk 50 feet with 2 turns activity did not occur: Safety/medical concerns         Walk 150 feet activity   Assist Walk 150 feet activity did not occur: Safety/medical concerns         Walk 10  feet on uneven surface  activity   Assist Walk 10 feet on uneven surfaces activity did not occur: Safety/medical concerns         Wheelchair     Assist Is the patient using a wheelchair?: Yes Type of Wheelchair: Manual    Wheelchair assist level: Dependent - Patient 0%      Wheelchair 50 feet with 2 turns activity    Assist        Assist Level: Dependent - Patient 0%   Wheelchair 150 feet activity     Assist      Assist Level: Dependent - Patient 0%   Blood pressure (!) 147/65, pulse 78, temperature 98.2 F (36.8 C), temperature source Oral, resp. rate 18, height 5\' 11"  (1.803 m), weight 74.8 kg, SpO2 97%.  Medical Problem List and Plan: 1. Functional deficits secondary to left basal ganglia ICH d/t hypertension/eliquis             -patient may shower             -ELOS/Goals: 10/07/23 goals min assist with PT, OT, SLP             - resting WHO and PRAFO    -Continue CIR therapies including PT, OT, and SLP       2.  Antithrombotics: -DVT/anticoagulation: heparin> eliquis.  11/29. Spoke with pharmacy. begin eliquis today 3. Pain Management: Tylenol prn. Voltaren gel QID.              -continue low dose gabapentin 100mg  at bedtime--dysesthetic pain improved 4. Mood/Behavior/Sleep: LCSW to follow for evaluation and support.              -antipsychotic agents: N/A 5. Neuropsych/cognition: This patient is capable of making decisions on his own behalf. 6. Skin/Wound Care: Routine pressure relief measures. 7. Fluids/Electrolytes/Nutrition:               --added low salt restrictions. Add protein supplement    -intake remains inconsistent at best. D1/ Nectar diet obviously doesn't help  -11/29 reviewed labs. Prealbumin low at 18. Encouraging pt to improve PO   -RD consulted also. I ordered calorie count today   -will begin remeron at HS to help with appetite   -Electrolytes and renal fxn probably close to baseline   -labs ordered for monday   8. T2DM  insulin dependent: Hgb A1c-6.2 on 09/06/23             -diet controlled -CBG's not being checked   9. PAD s/o aortobifemoral bypass w/right pseudoaneurysm: SBP  goal < 140.  --Plans for repair early December by Dr. Sherral Hammers? 10. COPD w/chronic bronchitis/emphysema: currently on 6 L oxygen. Titrate to keep sats > 90% per Dr. Vassie Loll.              --on Trelegy PTA --started on Breo/Incruse today.              --pulmonary toilet. Weaning down to baseline as able.  11. CAF: Monitor HR TID. On coreg 6.25mg  BID. Off eliquis due to ICH- per Neuro may resume on 11/25 if stable --Neurology recommends resuming  Eliquis in 2 weeks if neuro and BP stable.    -09/25/23 HR well controlled  Vitals:   09/25/23 1938 09/26/23 0533  BP: (!) 130/59 (!) 147/65  Pulse: 78 78  Resp: 18 18  Temp: 99.1 F (37.3 C) 98.2 F (36.8 C)  SpO2: 90% 97%    12. Dilated cardiomyopathy/AS/chronic systolic CHF: Low salt diet. Check weight daily--up from 166-->173? --CXR 11/11 w/vascular congestion-->will order follow up CXR to check for fluid overload as cause of hypoxia.  --Monitor for signs of overload. On Lasix 20mg  daily, coreg 6.25mg  BID, Imdur 30mg  daily, hydralazine 100mg  TID -LVEF 55-60% -Weights stable , will d/c lasix due to elevated BUN/Creat  11/29 -weights generally trending down which I think is nutritional. -09/26/23 wt stable the last couple days  Filed Weights   09/24/23 0500 09/25/23 0500 09/26/23 0509  Weight: 74.5 kg 74.9 kg 74.8 kg    13. CKD: stage IIIb? -11/17 BUN/Cr stable at 33/1.82, 11/19 baseline creat likely in 1.6-1.8 range based on labs in system  Improving , fluid intake recorded at 11/18 but only 236 mL on 11/19, Creat up, hold lasix starting 11/21  11/22- no labs today, +350cc yesterday--continue to hold lasix 11-23: BUN and creatinine remain elevated, unchanged from prior.  Output not recorded from last 2 days.  No signs of volume overload, continue to hold Lasix.  Encourage p.o.  fluids and recheck Monday.    Latest Ref Rng & Units 09/24/2023    3:55 AM 09/20/2023   12:47 PM 09/18/2023    4:27 AM  BMP  Glucose 70 - 99 mg/dL 865  784  696   BUN 8 - 23 mg/dL 29  29  34   Creatinine 0.61 - 1.24 mg/dL 2.95  2.84  1.32   Sodium 135 - 145 mmol/L 144  143  145   Potassium 3.5 - 5.1 mmol/L 3.9  4.0  3.9   Chloride 98 - 111 mmol/L 112  106  107   CO2 22 - 32 mmol/L 23  25  26    Calcium 8.9 - 10.3 mg/dL 8.8  9.3  9.2    44/01 Creat stable no signs of fluid overload I think he might be close to baseline   14. Dysphagia: On D1, nectar- likely contributing to poor intake of liquids               -encourage fluids, aspiration precautions   -see above  15. HTN: on amlodipine 10mg  daily, coreg 6.25mg  BID, clonidine 0.2mg  q8h, hydralazine 100mg  TID  BP reasonable off furosemide   -09/26/23 BPs stable, monitor Vitals:   09/22/23 1943 09/23/23 0359 09/23/23 1231 09/23/23 1913  BP: 132/62 (!) 148/78 (!) 125/55 (!) 122/52   09/24/23 0509 09/24/23 1307 09/24/23 1953 09/24/23 1953  BP: 139/60 (!) 121/54 (!) 137/58 (!) 137/58   09/25/23 0426 09/25/23 1437 09/25/23 1938 09/26/23 0533  BP: (!) 147/61 138/63 Marland Kitchen)  130/59 (!) 147/65    16. Hx of seizure: continue keppra 500mg  BID  17. HLD: continue rosuvastatin 10mg  QHS  18.  Right hip pain suspect OA + immobility xray hip is unremarkable--stable  19. Constipation:  -09/26/23 no BM documented since 09/22/23; will increase senokot S to 2 tabs BID since miralax might be difficult to achieve nectar thick; could consider other/additional laxative if no BM tomorrow    LOS: 16 days A FACE TO FACE EVALUATION WAS PERFORMED  [address] 09/26/2023, 9:47 AM

## 2023-09-27 ENCOUNTER — Other Ambulatory Visit: Payer: Self-pay

## 2023-09-27 DIAGNOSIS — I724 Aneurysm of artery of lower extremity: Secondary | ICD-10-CM

## 2023-09-27 LAB — CBC
HCT: 40.7 % (ref 39.0–52.0)
Hemoglobin: 12.6 g/dL — ABNORMAL LOW (ref 13.0–17.0)
MCH: 28.8 pg (ref 26.0–34.0)
MCHC: 31 g/dL (ref 30.0–36.0)
MCV: 93.1 fL (ref 80.0–100.0)
Platelets: 230 10*3/uL (ref 150–400)
RBC: 4.37 MIL/uL (ref 4.22–5.81)
RDW: 15 % (ref 11.5–15.5)
WBC: 7.6 10*3/uL (ref 4.0–10.5)
nRBC: 0 % (ref 0.0–0.2)

## 2023-09-27 MED ORDER — SORBITOL 70 % SOLN
30.0000 mL | Freq: Once | Status: AC
  Administered 2023-09-27: 30 mL via ORAL
  Filled 2023-09-27: qty 30

## 2023-09-27 MED ORDER — ORAL CARE MOUTH RINSE: 15.0000 mL | Freq: Three times a day (TID) | OROMUCOSAL | Status: DC

## 2023-09-27 MED ORDER — ACETAMINOPHEN 325 MG PO TABS: 650.0000 mg | ORAL_TABLET | ORAL | Status: AC | PRN

## 2023-09-27 MED ORDER — ACETAMINOPHEN 325 MG PO TABS
650.0000 mg | ORAL_TABLET | Freq: Three times a day (TID) | ORAL | Status: DC
  Administered 2023-09-27 – 2023-10-06 (×27): 650 mg via ORAL
  Filled 2023-09-27 (×27): qty 2

## 2023-09-27 NOTE — Progress Notes (Signed)
Physical Therapy Weekly Progress Note  Patient Details  Name: Kyle [name] MRN: [id number] Date of Birth: [birth year]  Beginning of progress report period: September 19, 2023 End of progress report period: September 27, 2023  Today's Date: 09/27/2023  Patient has met {number 1-5:22450} of {number 1-5:20334} short term goals.  Pt making very slow progress towards goals and may need add'l time to meet LTG due to barriers from high pain levels and nausea preventing complete participation in scheduled therapy sessions. He continues to require physical assist to complete bed mobility d/t pain, functional transfers with CGA/ close supervision dependent on fatigue/ weakness in BLE from pain, and completes short distance ambulation using RW and CGA. Has not yet progressed to stair training. He continues to self limit daily d/t pain and nausea 2/2 pain (despite continued education), and is limited by low activity tolerance as wellas generalized and BLE weakness. *** has participated in family education to prepare for d/c home.    Patient continues to demonstrate the following deficits {impairments:3041632} and therefore will continue to benefit from skilled PT intervention to increase functional independence with mobility.  Patient not progressing toward long term goals.  See goal revision..  Plan of care revisions: ***.  PT Short Term Goals {PIR:5188416}  Skilled Therapeutic Interventions/Progress Updates:  Ambulation/gait training;Community reintegration;Neuromuscular re-education;Stair training;UE/LE Strength taining/ROM;Wheelchair propulsion/positioning;Balance/vestibular training;Discharge planning;Therapeutic Activities;UE/LE Coordination activities;Patient/family education;Functional mobility training;Therapeutic Exercise;DME/adaptive equipment instruction;Psychosocial support;Functional electrical stimulation;Splinting/orthotics;Cognitive remediation/compensation;Visual/perceptual  remediation/compensation;Pain management   Therapy Documentation Precautions:  Precautions Precautions: Fall Precaution Comments: R hemiplegia, slight "pusher", SBP <140 Restrictions Weight Bearing Restrictions: No  Pain:    Therapy/Group: Individual Therapy  Loel Dubonnet PT, DPT, CSRS 09/27/2023, 4:32 PM

## 2023-09-27 NOTE — Progress Notes (Addendum)
Nutrition Follow-up  DOCUMENTATION CODES:   Not applicable  INTERVENTION:   Continue Ensure Enlive po TID, each supplement provides 350 kcal and 20 grams of protein. Continue Magic cup TID with meals, each supplement provides 290 kcal and 9 grams of protein. Continue Mighty Shake TID with meals, each supplement provides 330 kcals and 9 grams of protein. Discontinue calorie count. Patient is meeting 100% of kcal needs and 87% of protein needs with intake of meals and supplements.   NUTRITION DIAGNOSIS:   Inadequate oral intake related to dysphagia as evidenced by meal completion < 50%.  GOAL:   Patient will meet greater than or equal to 90% of their needs  MONITOR:   PO intake, Supplement acceptance  REASON FOR ASSESSMENT:   Consult Assessment of nutrition requirement/status, Poor PO  ASSESSMENT:   72 yo male admitted with functional deficits d/t ICH. PMH includes aortic insufficiency, HTN, dilated cardiomyopathy, CHF, COPD, arterial occlusive disease, PAD, pulmonary HTN, CKD, emphysema of lung, A fib, DM-2 insulin dependent.  Patient working with OT during RD visit; unable to speak with patient.   Calorie count completed over the weekend:  Diet: dysphagia 1 with nectar thick liquids, receives Magic cup TID and Mighty Shake TID with meals Supplements: Ensure Enlive TID   11/30 Breakfast: 369 kcal, 9 gm protein 11/30 Lunch: 539 kcal, 16 gm protein 11/30 Dinner: 480 kcal, 16 gm protein Supplements: 700 kcal, 40 gm protein  12/01 Breakfast: 530 kcal, 19 gm protein 12/01 Lunch: 332 kcal, 17 gm protein 12/01 Dinner: meal ticket not available Supplements: 700 kcal, 40 gm protein  12/02 Breakfast: 342 kcal, 13 gm protein Supplements: 0 kcal, 0 gm protein  Average daily intake: 2030 kcal (100% of minimum estimated needs)  87 protein (87% of minimum estimated needs)  Labs reviewed.   Medications reviewed and include Keppra, Protonix, Senokot-S, Remeron.  Admit  weight 71.8 kg Current weight 74.6 kg  NUTRITION - FOCUSED PHYSICAL EXAM:  Unable to complete  Diet Order:   Diet Order             DIET - DYS 1 Room service appropriate? Yes with Assist; Fluid consistency: Nectar Thick  Diet effective now                   EDUCATION NEEDS:   Not appropriate for education at this time  Skin:  Skin Assessment: Reviewed RN Assessment  Last BM:  12/2 type 6  Height:   Ht Readings from Last 1 Encounters:  09/10/23 5\' 11"  (1.803 m)    Weight:   Wt Readings from Last 1 Encounters:  09/27/23 74.6 kg    Ideal Body Weight:  78.2 kg  BMI:  Body mass index is 22.94 kg/m.  Estimated Nutritional Needs:   Kcal:  2000-2200  Protein:  100-115 gm  Fluid:  2-2.2 L   Gabriel Rainwater RD, LDN, CNSC Please refer to Amion for contact information.

## 2023-09-27 NOTE — Progress Notes (Signed)
PROGRESS NOTE   Subjective/Complaints:  No RIght hip pain , + right shoulder pain, discussed lack of recovery thus far   ROS: as per HPI. Denies CP, SOB, abd pain, N/V/D/C, or any other complaints at this time.    Objective:   No results found. Recent Labs    09/25/23 0509 09/27/23 0720  WBC 7.3 7.6  HGB 12.5* 12.6*  HCT 39.8 40.7  PLT 249 230    No results for input(s): "NA", "K", "CL", "CO2", "GLUCOSE", "BUN", "CREATININE", "CALCIUM" in the last 72 hours.   Intake/Output Summary (Last 24 hours) at 09/27/2023 0753 Last data filed at 09/27/2023 0746 Gross per 24 hour  Intake 358 ml  Output --  Net 358 ml         Physical Exam: Vital Signs Blood pressure 132/61, pulse 90, temperature 98.4 F (36.9 C), temperature source Oral, resp. rate 18, height 5\' 11"  (1.803 m), weight 74.6 kg, SpO2 90%.  General: No acute distress Mood and affect are appropriate Heart: Regular rate and rhythm no rubs murmurs or extra sounds Lungs: Clear to auscultation, breathing unlabored, no rales or wheezes Abdomen: Positive bowel sounds, soft nontender to palpation, nondistended Extremities: No clubbing, cyanosis, or edema Skin: No evidence of breakdown, no evidence of rash  Neuro:  pt is alert and oriented. Follows basic commands. Fair insight and awareness. Speech with ongoing  dysarthria. Right central 7 remains. Antigravity and against resistance in left upper and lower extremities; flaccid right upper and lower extremity Flaccid tone -  RUE 0/5, RLE --trace HF/E and 0/5 KE/ADF/APF. Decreased light touch in  RUE.     Assessment/Plan: 1. Functional deficits which require 3+ hours per day of interdisciplinary therapy in a comprehensive inpatient rehab setting. Physiatrist is providing close team supervision and 24 hour management of active medical problems listed below. Physiatrist and rehab team continue to assess barriers to  discharge/monitor patient progress toward functional and medical goals  Care Tool:  Bathing    Body parts bathed by patient: Right arm, Chest, Abdomen, Front perineal area, Right upper leg, Left upper leg, Face   Body parts bathed by helper: Right lower leg, Left lower leg, Buttocks, Front perineal area, Left arm     Bathing assist Assist Level: Maximal Assistance - Patient 24 - 49%     Upper Body Dressing/Undressing Upper body dressing   What is the patient wearing?: Pull over shirt    Upper body assist Assist Level: Minimal Assistance - Patient > 75%    Lower Body Dressing/Undressing Lower body dressing      What is the patient wearing?: Pants, Incontinence brief, Hospital gown only     Lower body assist Assist for lower body dressing: Maximal Assistance - Patient 25 - 49%     Toileting Toileting    Toileting assist Assist for toileting: Total Assistance - Patient < 25%     Transfers Chair/bed transfer  Transfers assist     Chair/bed transfer assist level: Maximal Assistance - Patient 25 - 49%     Locomotion Ambulation   Ambulation assist   Ambulation activity did not occur: Safety/medical concerns  Walk 10 feet activity   Assist  Walk 10 feet activity did not occur: Safety/medical concerns        Walk 50 feet activity   Assist Walk 50 feet with 2 turns activity did not occur: Safety/medical concerns         Walk 150 feet activity   Assist Walk 150 feet activity did not occur: Safety/medical concerns         Walk 10 feet on uneven surface  activity   Assist Walk 10 feet on uneven surfaces activity did not occur: Safety/medical concerns         Wheelchair     Assist Is the patient using a wheelchair?: Yes Type of Wheelchair: Manual    Wheelchair assist level: Dependent - Patient 0%      Wheelchair 50 feet with 2 turns activity    Assist        Assist Level: Dependent - Patient 0%    Wheelchair 150 feet activity     Assist      Assist Level: Dependent - Patient 0%   Blood pressure 132/61, pulse 90, temperature 98.4 F (36.9 C), temperature source Oral, resp. rate 18, height 5\' 11"  (1.803 m), weight 74.6 kg, SpO2 90%.  Medical Problem List and Plan: 1. Functional deficits secondary to left basal ganglia ICH d/t hypertension/eliquis on 09/05/2023             -patient may shower             -ELOS/Goals: 10/07/23 goals min assist with PT, OT, SLP             - resting WHO and PRAFO    -Continue CIR therapies including PT, OT, and SLP       2.  Antithrombotics: -DVT/anticoagulation:Eliquis ( for A fib)  3. Pain Management: Tylenol prn. Voltaren gel QID.              -continue low dose gabapentin 100mg  at bedtime--dysesthetic pain improved 4. Mood/Behavior/Sleep: LCSW to follow for evaluation and support.              -antipsychotic agents: N/A 5. Neuropsych/cognition: This patient is capable of making decisions on his own behalf. 6. Skin/Wound Care: Routine pressure relief measures. 7. Fluids/Electrolytes/Nutrition:               --added low salt restrictions. Add protein supplement    Intake of meals averaging ~25-50% Recorded intake of fluids variable 152mL-589mL       Latest Ref Rng & Units 09/24/2023    3:55 AM 09/20/2023   12:47 PM 09/18/2023    4:27 AM  BMP  Glucose 70 - 99 mg/dL 213  086  578   BUN 8 - 23 mg/dL 29  29  34   Creatinine 0.61 - 1.24 mg/dL 4.69  6.29  5.28   Sodium 135 - 145 mmol/L 144  143  145   Potassium 3.5 - 5.1 mmol/L 3.9  4.0  3.9   Chloride 98 - 111 mmol/L 112  106  107   CO2 22 - 32 mmol/L 23  25  26    Calcium 8.9 - 10.3 mg/dL 8.8  9.3  9.2       8. T2DM insulin dependent: Hgb A1c-6.2 on 09/06/23             -diet controlled -CBG's not being checked   9. PAD s/o aortobifemoral bypass w/right pseudoaneurysm: SBP goal < 140.  --Plans for repair early December by  Dr. Sherral Hammers? 10. COPD w/chronic bronchitis/emphysema:  currently on 6 L oxygen. Titrate to keep sats > 90% per Dr. Vassie Loll.              --on Trelegy PTA --started on Breo/Incruse today.              --pulmonary toilet. Weaning down to baseline as able.  11. CAF: Monitor HR TID. On coreg 6.25mg  BID. Off eliquis due to ICH- per Neuro may resume on 11/25 if stable --Neurology recommends resuming  Eliquis in 2 weeks if neuro and BP stable.    -09/27/23 HR well controlled  Vitals:   09/27/23 0356 09/27/23 0508  BP: 132/61 132/61  Pulse: 90   Resp: 18   Temp: 98.4 F (36.9 C)   SpO2: 90%     12. Dilated cardiomyopathy/AS/chronic systolic CHF: Low salt diet. Check weight daily--up from 166-->173? --CXR 11/11 w/vascular congestion-->will order follow up CXR to check for fluid overload as cause of hypoxia.  --Monitor for signs of overload. On Lasix 20mg  daily, coreg 6.25mg  BID, Imdur 30mg  daily, hydralazine 100mg  TID -LVEF 55-60% -Weights stable , will d/c lasix due to elevated BUN/Creat  11/29 -weights generally trending down which I think is nutritional. -09/26/23 wt stable the last couple days  Filed Weights   09/25/23 0500 09/26/23 0509 09/27/23 0408  Weight: 74.9 kg 74.8 kg 74.6 kg    13. CKD: stage IIIb? -11/17 BUN/Cr stable at 33/1.82, 11/19 baseline creat likely in 1.6-1.8 range based on labs in system  Improving , fluid intake recorded at 11/18 but only 236 mL on 11/19, Creat up, hold lasix starting 11/21  11/22- no labs today, +350cc yesterday--continue to hold lasix 11-23: BUN and creatinine remain elevated, unchanged from prior.  Output not recorded from last 2 days.  No signs of volume overload, continue to hold Lasix.  Encourage p.o. fluids and recheck Monday.    Latest Ref Rng & Units 09/24/2023    3:55 AM 09/20/2023   12:47 PM 09/18/2023    4:27 AM  BMP  Glucose 70 - 99 mg/dL 409  811  914   BUN 8 - 23 mg/dL 29  29  34   Creatinine 0.61 - 1.24 mg/dL 7.82  9.56  2.13   Sodium 135 - 145 mmol/L 144  143  145   Potassium  3.5 - 5.1 mmol/L 3.9  4.0  3.9   Chloride 98 - 111 mmol/L 112  106  107   CO2 22 - 32 mmol/L 23  25  26    Calcium 8.9 - 10.3 mg/dL 8.8  9.3  9.2    08/65 Creat stable no signs of fluid overload I think he might be close to baseline   14. Dysphagia: On D1, nectar- likely contributing to poor intake of liquids               -encourage fluids, aspiration precautions   -see above  15. HTN: on amlodipine 10mg  daily, coreg 6.25mg  BID, clonidine 0.2mg  q8h, hydralazine 100mg  TID  BP reasonable off furosemide   12/2 controlled  Vitals:   09/24/23 0509 09/24/23 1307 09/24/23 1953 09/24/23 1953  BP: 139/60 (!) 121/54 (!) 137/58 (!) 137/58   09/25/23 0426 09/25/23 1437 09/25/23 1938 09/26/23 0533  BP: (!) 147/61 138/63 (!) 130/59 (!) 147/65   09/26/23 1315 09/26/23 1917 09/27/23 0356 09/27/23 0508  BP: 136/63 125/60 132/61 132/61    16. Hx of seizure: continue keppra 500mg  BID  17. HLD: continue  rosuvastatin 10mg  QHS  18.  Right hip pain no sig OA on Xray  19. Constipation:  -09/26/23 no BM documented since 09/22/23; will increase senokot S to 2 tabs BID since miralax might be difficult to achieve nectar thick; could consider other/additional laxative if no BM tomorrow    LOS: 17 days A FACE TO FACE EVALUATION WAS PERFORMED  Erick Colace 09/27/2023, 7:53 AM

## 2023-09-27 NOTE — Discharge Instructions (Addendum)
Inpatient Rehab Discharge Instructions  Kyle [name] Discharge date and time:    Activities/Precautions/ Functional Status: Activity: no lifting, driving, or strenuous exercise till cleared by MD Diet: Dysphagia #1 nectar thick liquids Wound Care: none needed   Functional status:  ___ No restrictions     ___ Walk up steps independently _X__ 24/7 supervision/assistance   ___ Walk up steps with assistance ___ Intermittent supervision/assistance  ___ Bathe/dress independently ___ Walk with walker     _X__ Bathe/dress with assistance ___ Walk Independently    ___ Shower independently ___ Walk with assistance    ___ Shower with assistance _X__ No alcohol     ___ Return to work/school ________   Special Instructions:    STROKE/TIA DISCHARGE INSTRUCTIONS SMOKING Cigarette smoking nearly doubles your risk of having a stroke & is the single most alterable risk factor  If you smoke or have smoked in the last 12 months, you are advised to quit smoking for your health. Most of the excess cardiovascular risk related to smoking disappears within a year of stopping. Ask you doctor about anti-smoking medications [place] Quit Line: 1-800-QUIT NOW Free Smoking Cessation Classes [phone]  CHOLESTEROL Know your levels; limit fat & cholesterol in your diet  Lipid Panel     Component Value Date/Time   CHOL 123 09/06/2023 0514   CHOL 144 02/03/2023 0927   TRIG 62 09/08/2023 0532   HDL 40 (L) 09/06/2023 0514   HDL 40 02/03/2023 0927   CHOLHDL 3.1 09/06/2023 0514   VLDL 22 09/06/2023 0514   LDLCALC 61 09/06/2023 0514   LDLCALC 71 02/03/2023 0927     Many patients benefit from treatment even if their cholesterol is at goal. Goal: Total Cholesterol (CHOL) less than 160 Goal:  Triglycerides (TRIG) less than 150 Goal:  HDL greater than 40 Goal:  LDL (LDLCALC) less than 100   BLOOD PRESSURE American Stroke Association blood pressure target is less that 120/80 mm/Hg  Your discharge blood  pressure is:  BP: 132/61 Monitor your blood pressure Limit your salt and alcohol intake Many individuals will require more than one medication for high blood pressure  DIABETES (A1c is a blood sugar average for last 3 months) Goal HGBA1c is under 7% (HBGA1c is blood sugar average for last 3 months)  Diabetes: No known diagnosis of diabetes    Lab Results  Component Value Date   HGBA1C 6.2 (H) 09/06/2023    Your HGBA1c can be lowered with medications, healthy diet, and exercise. Check your blood sugar as directed by your physician Call your physician if you experience unexplained or low blood sugars.  PHYSICAL ACTIVITY/REHABILITATION Goal is 30 minutes at least 4 days per week  Activity: No driving, Therapies: see above Return to work: N/A Activity decreases your risk of heart attack and stroke and makes your heart stronger.  It helps control your weight and blood pressure; helps you relax and can improve your mood. Participate in a regular exercise program. Talk with your doctor about the best form of exercise for you (dancing, walking, swimming, cycling).  DIET/WEIGHT Goal is to maintain a healthy weight  Your discharge diet is:  Diet Order             DIET - DYS 1 Room service appropriate? Yes with Assist; Fluid consistency: Nectar Thick  Diet effective now                   liquids Your height is:  Height: 5\' 11"  (180.3  cm) Your current weight is: Weight: 74.6 kg Your Body Mass Index (BMI) is:  BMI (Calculated): 22.95 Following the type of diet specifically designed for you will help prevent another stroke. You are at goal weight    Your goal Body Mass Index (BMI) is 19-24. Healthy food habits can help reduce 3 risk factors for stroke:  High cholesterol, hypertension, and excess weight.  RESOURCES Stroke/Support Group:  Call [phone]   STROKE EDUCATION PROVIDED/REVIEWED AND GIVEN TO PATIENT Stroke warning signs and symptoms How to activate emergency medical system  (call 911). Medications prescribed at discharge. Need for follow-up after discharge. Personal risk factors for stroke. Pneumonia vaccine given:  Flu vaccine given:  My questions have been answered, the writing is legible, and I understand these instructions.  I will adhere to these goals & educational materials that have been provided to me after my discharge from the hospital.     My questions have been answered and I understand these instructions. I will adhere to these goals and the provided educational materials after my discharge from the hospital.  Patient/Caregiver Signature _______________________________ Date __________  Clinician Signature _______________________________________ Date __________  Please bring this form and your medication list with you to all your follow-up doctor's appointments.

## 2023-09-27 NOTE — Progress Notes (Addendum)
Occupational Therapy Weekly Progress Note  Patient Details  Name: Kyle [name] MRN: [id number] Date of Birth: 04/25/1951  Beginning of progress report period: September 17, 2023 End of progress report period: September 27, 2023  Today's Date: 09/27/2023 OT Individual Time: 1100-1200 OT Individual Time Calculation (min): 60 min    Patient has met 0 of 4 short term goals.  Pt is progressing slowly in all areas but has not met his STGs.  Patient continues to demonstrate the following deficits: muscle weakness, decreased cardiorespiratoy endurance and decreased oxygen support, abnormal tone, decreased midline orientation and decreased attention to right, decreased attention, decreased awareness, decreased problem solving, decreased memory, and delayed processing, and decreased sitting balance, decreased standing balance, decreased postural control, hemiplegia, and decreased balance strategies and therefore will continue to benefit from skilled OT intervention to enhance overall performance with BADL and Reduce care partner burden Several family education sessions will be needed.   Patient not progressing toward long term goals.  See goal revision..  Plan of care revisions: .Marland Kitchen  Problem: RH Balance Goal: LTG: Patient will maintain dynamic sitting balance (OT) Description: LTG:  Patient will maintain dynamic sitting balance with assistance during activities of daily living (OT) Flowsheets (Taken 09/27/2023 1235) LTG: Pt will maintain dynamic sitting balance during ADLs with: (LTG downgraded due to slow progress.) Minimal Assistance - Patient > 75% Note: LTG downgraded to min A due to slow progress.  Goal: LTG Patient will maintain dynamic standing with ADLs (OT) Description: LTG:  Patient will maintain dynamic standing balance with assist during activities of daily living (OT)  Flowsheets (Taken 09/27/2023 1235) LTG: Pt will maintain dynamic standing balance during ADLs with: (LTG downgraded due to  slow progress.) Maximal Assistance - Patient 25 - 49% Note: LTG downgraded to Max A due to slow progress.    Problem: Sit to Stand Goal: LTG:  Patient will perform sit to stand in prep for activites of daily living with assistance level (OT) Description: LTG:  Patient will perform sit to stand in prep for activites of daily living with assistance level (OT) Flowsheets (Taken 09/27/2023 1235) LTG: PT will perform sit to stand in prep for activites of daily living with assistance level: (LTG downgraded due to slow progress.) Moderate Assistance - Patient 50 - 74% Note: LTG downgraded mod A due to slow progress.    Problem: RH Eating Goal: LTG Patient will perform eating w/assist, cues/equip (OT) Description: LTG: Patient will perform eating with assist, with/without cues using equipment (OT) Flowsheets (Taken 09/27/2023 1235) LTG: Pt will perform eating with assistance level of: (LTG downgraded due to slow progress.) Supervision/Verbal cueing Note: LTG downgraded to supervision due to slow progress.    Problem: RH Dressing Goal: LTG Patient will perform upper body dressing (OT) Description: LTG Patient will perform upper body dressing with assist, with/without cues (OT). Flowsheets (Taken 09/27/2023 1235) LTG: Pt will perform upper body dressing with assistance level of: (LTG downgraded due to slow progress.) Minimal Assistance - Patient > 75% Note: LTG downgraded to min A due to slow progress.  Goal: LTG Patient will perform lower body dressing w/assist (OT) Description: LTG: Patient will perform lower body dressing with assist, with/without cues in positioning using equipment (OT) Flowsheets (Taken 09/27/2023 1235) LTG: Pt will perform lower body dressing with assistance level of: (LTG downgraded due to slow progress.) Maximal Assistance - Patient 25 - 49% Note: LTG downgraded to max A  due to slow progress.    Problem: RH Toileting Goal: LTG Patient  will perform toileting task (3/3 steps)  with assistance level (OT) Description: LTG: Patient will perform toileting task (3/3 steps) with assistance level (OT)  Flowsheets (Taken 09/27/2023 1235) LTG: Pt will perform toileting task (3/3 steps) with assistance level: (LTG downgraded due to slow progress.) Maximal Assistance - Patient 25 - 49% Note: LTG downgraded to max A due to slow progress.    Problem: RH Toilet Transfers Goal: LTG Patient will perform toilet transfers w/assist (OT) Description: LTG: Patient will perform toilet transfers with assist, with/without cues using equipment (OT) Flowsheets (Taken 09/27/2023 1235) LTG: Pt will perform toilet transfers with assistance level of: (LTG downgraded due to slow progress.) Maximal Assistance - Patient 25 - 49% Note: LTG downgraded to max A due to slow progress.    Problem: RH Tub/Shower Transfers Goal: LTG Patient will perform tub/shower transfers w/assist (OT) Description: LTG: Patient will perform tub/shower transfers with assist, with/without cues using equipment (OT) Flowsheets (Taken 09/27/2023 1235) LTG: Pt will perform tub/shower stall transfers with assistance level of: (LTG discontinued at this time as pt is not safe to get into the shower due to impaired sitting balance and control.) -- Note: LTG discontinued at this time as pt is not safe to get into the shower due to impaired sitting balance and control.               OT Short Term Goals Week 1:  OT Short Term Goal 1 (Week 1): The pt will complete UB bathing and dressing with MinA consistently at 95% safe OT Short Term Goal 1 - Progress (Week 1): Progressing toward goal OT Short Term Goal 2 (Week 1): The pt will complete LB bathing a dressing using AE at Mod A at 95% safe OT Short Term Goal 2 - Progress (Week 1): Progressing toward goal OT Short Term Goal 3 (Week 1): The pt will transfer to all surfaces with Mod A using the sliding board at 95% safe OT Short Term Goal 3 - Progress (Week 1): Progressing toward  goal OT Short Term Goal 4 (Week 1): The pt will maintain good sit balance  consistently at 95% safe OT Short Term Goal 4 - Progress (Week 1): Progressing toward goal OT Short Term Goal 5 (Week 1): The pt will tolerate > 45 Minutes of therapy with minimal restbreaks at 95% safe. OT Short Term Goal 5 - Progress (Week 1): Progressing toward goal Week 2:  OT Short Term Goal 1 (Week 2): pt will demonstrate improved sitting balance to sit upright without back support consistently for at least 10 minutes at a time to enable him to engage in sefl care tasks at EOB. OT Short Term Goal 1 - Progress (Week 2): Progressing toward goal OT Short Term Goal 2 (Week 2): Pt will don shirt with mod A demonstrating improved attention and awareness of RUE. OT Short Term Goal 2 - Progress (Week 2): Progressing toward goal OT Short Term Goal 3 (Week 2): Pt will complete squat pivots w/c >< bed or mat to L and to R consistently with max A of 1 to progress to completing toilet transfers in a safe manner (pt currently requires a stedy lift) OT Short Term Goal 3 - Progress (Week 2): Progressing toward goal OT Short Term Goal 4 (Week 2): Pt will demonstrate improved awareness of RUE by initiating reaching for R arm with L arm to prepare for bed mobility. OT Short Term Goal 4 - Progress (Week 2): Progressing toward goal Week 3:  OT Short  Term Goal 1 (Week 3): STGs = LTGs  Skilled Therapeutic Interventions/Progress Updates:    Pt received in tilt in space w/c leaning to the side stating he was in pain and wanted to get back to bed.  Pt on 4.5 L of O2. The goal of the session was family education for his wife.  explained that she had L wrist issues from a prior fall and continues to have some difficulty with mobility due to prior CVA.  Had rehab tech participate in session so we could demonstrate how to support him in sitting with sit to squat to prepare for transfer and then have his wife try with help from this OT.  Pt stating  he was in pain and pt not able to focus so he required total A to support his trunk and unable to unweight his hips forward to prep for sit to squat as pt resisting movement.  Not safe for wife to try today.  Instead use a slide board under pt's L hip and then pt used L hand to slide on board which he was able to do with heavy mod A of 1.  The slide board transfer was much easier to facilitate than the squat pivot transfers which have been requiring 2 person assist.  Once at EOB,  heavy max A to sit and move to supine with pt yelling out in pain that R leg hurting with lifting it.   [name] said her bed at home is much taller. A slide board would not work in that situation so recommended a hospital bed. The patient was opposed to a hospital bed.  Suggested a hoyer lift which his wife has seen used in other nursing homes.  Later this week will introduce hoyer lifts to see how pt tolerates and wife manages. Discussed the need for her to have more assist at home and to consider hiring additional help.    Pt's c/o mid arm to finger pain.  Applied warm pack and then massage and then patient was able to tolerate touch.   Positioned pillows to support arm. Once in a resting position pt not c/o pain.   Once pt in bed, pt crying quite a bit about the situation he is in and then his wife was crying telling him not to cry.  Encouraged her to let him cry as he needs time to grieve his losses and the changes he has been experiencing.  Pt upset that his wife will need to do so much care for him. Spent time counseling with pt and wife.   Recommended to have a chaplain talk with patient.  Requested to RN to make a referral for chaplain care.  Pt in bed with all needs met and alarm set.       Therapy Documentation Precautions:  Precautions Precautions: Fall Precaution Comments: R hemiplegia, slight "pusher", SBP <140 Restrictions Weight Bearing Restrictions: No    Vital Signs: Therapy Vitals BP:  132/61 Pain: Pain Assessment Pain Scale: 0-10 Pain Score: 4  Pain Location: Arm Pain Orientation: Right Pain Descriptors / Indicators: Sore Pain Intervention(s): Medication (See eMAR) Multiple Pain Sites: No ADL: ADL Equipment Provided:  (Stedyx2) Eating: Minimal assistance Where Assessed-Eating: Bed level Grooming: Minimal assistance Where Assessed-Grooming: Edge of bed Upper Body Bathing: Moderate assistance Where Assessed-Upper Body Bathing: Edge of bed Lower Body Bathing: Maximal assistance Where Assessed-Lower Body Bathing: Edge of bed Upper Body Dressing: Maximal assistance Where Assessed-Upper Body Dressing: Edge of bed Lower Body Dressing: Maximal  assistance Where Assessed-Lower Body Dressing: Edge of bed Toileting: Dependent Where Assessed-Toileting: Bedside Commode (BSC over toilet) Toilet Transfer: Maximal assistance Toilet Transfer Method: Other (comment) (STEDY lift +2 A) Tub/Shower Transfer: Unable to assess Tub/Shower Transfer Method:  (UTA) Tub/Shower Equipment: Other (comment) (UTA) Walk-In Shower Transfer: Unable to assess Film/video editor Method: Unable to assess (UTA) Walk-In Shower Equipment: Other (comment) (UTA) ADL Comments: Patient inconsistent with unsupported sitting   Therapy/Group: Individual Therapy  Zethan Alfieri 09/27/2023, 8:34 AM

## 2023-09-27 NOTE — Progress Notes (Signed)
Speech Language Pathology Daily Session Note  Patient Details  Name: GARNEY MINION MRN: [id number] Date of Birth: Jul 07, 1951  Today's Date: 09/27/2023 SLP Individual Time: 0865-7846 SLP Individual Time Calculation (min): 31 min  Short Term Goals: Week 3: SLP Short Term Goal 1 (Week 3): Patient will utilize swallowing strategies during consumpution of D1/NTL diet to reduce s/sx of aspiration with min multimodal A SLP Short Term Goal 2 (Week 3): Patient will participate in dysphagia treatment to demonstrate a functional change in oropharyngeal swallow given supervisionA multimodal SLP Short Term Goal 3 (Week 3): Patient will increase speech intelligibility to 80% at the word level given min multimodal A  Skilled Therapeutic Interventions:  Patient was seen in am for family education session. [name] present and participating throughout. SLP reviewed current diet recommendations for Dys 1 solids and NTL. SLP provided hands on instruction of thickening liquids and thickening agents. SLP also reviewed Dys 1 consistencies, examples of food to provided, and foods to avoid. SLP provided education on what dysphagia is, safe swallowing strategies, and s/s asp. SLP also addressed speech including education on dysarthria dx. SLP discussed ways to maximize speech intelligibility, energy conservation methods, and home exercise. [name] participated throughout by adding insight as necessary and asking thoughtful questions. Pt and his wife with no further concerns at conclusion of session. Pt was left seated upright in WC with call button within reach, chair alarm active, and wife present. SLP to continue POC.   Pain Pain Assessment Pain Scale: 0-10 Pain Score: 0-No pain  Therapy/Group: Individual Therapy  Renaee Munda 09/27/2023, 12:25 PM

## 2023-09-27 NOTE — Progress Notes (Signed)
Physical Therapy Session Note  Patient Details  Name: Kyle [name] MRN: [id number] Date of Birth: September 28, 1951  Today's Date: 09/27/2023 PT Individual Time: 1420-1519 PT Individual Time Calculation (min): 59 min   Short Term Goals: Week 1:  PT Short Term Goal 1 (Week 1): Pt will roll side to side w/ mod A. PT Short Term Goal 1 - Progress (Week 1): Progressing toward goal PT Short Term Goal 2 (Week 1): Pt will transfer sup to sit w/ mod A. PT Short Term Goal 2 - Progress (Week 1): Progressing toward goal PT Short Term Goal 3 (Week 1): Pt will perform squat pivot transfer w/ mod A. PT Short Term Goal 3 - Progress (Week 1): Progressing toward goal Week 2:  PT Short Term Goal 1 (Week 2): Pt will roll side to side w/ mod A. PT Short Term Goal 2 (Week 2): Pt will transfer sup to sit w/ mod A. PT Short Term Goal 3 (Week 2): Pt will perform squat pivot transfer w/ mod A. PT Short Term Goal 4 (Week 2): Pt will ambulate 30 ft using LRAD with maxA. PT Short Term Goal 5 (Week 2): Pt will initiate w/c mobility. Week 3:     Skilled Therapeutic Interventions/Progress Updates:  Patient *** on entrance to room. Patient alert and agreeable to PT session.   Patient with no pain complaint at start of session.  Therapeutic Activity: Bed Mobility: Pt performed supine <> sit with ***. VC/ tc required for ***. Transfers: Pt performed sit<>stand and stand pivot transfers throughout session with ***. Provided verbal cues for***.  Gait Training:  Pt ambulated *** ft using *** with ***. Demonstrated ***. Provided vc/ tc for ***.  Wheelchair Mobility:  Pt propelled wheelchair *** feet with ***. Provided vc/ tc for ***.  Neuromuscular Re-ed: NMR facilitated during session with focus on***. Pt guided in ***. NMR performed for improvements in motor control and coordination, balance, sequencing, judgement, and self confidence/ efficacy in performing all aspects of mobility at highest level of independence.    Therapeutic Exercise: Pt performed the following exercises with vc/ tc for proper technique. ***  Patient *** at end of session with brakes locked, *** alarm set, and all needs within reach.   Therapy Documentation Precautions:  Precautions Precautions: Fall Precaution Comments: R hemiplegia, slight "pusher", SBP <140 Restrictions Weight Bearing Restrictions: No  Pain:    Therapy/Group: Individual Therapy  Loel Dubonnet PT, DPT, CSRS 09/27/2023, 4:32 PM

## 2023-09-27 NOTE — Progress Notes (Signed)
Physical Therapy Session Note  Patient Details  Name: Kyle [name] MRN: [id number] Date of Birth: 1951-09-06  Today's Date: 09/27/2023 PT Individual Time: 0903-1005 PT Individual Time Calculation (min): 62 min   Short Term Goals: Week 1:  PT Short Term Goal 1 (Week 1): Pt will roll side to side w/ mod A. PT Short Term Goal 1 - Progress (Week 1): Progressing toward goal PT Short Term Goal 2 (Week 1): Pt will transfer sup to sit w/ mod A. PT Short Term Goal 2 - Progress (Week 1): Progressing toward goal PT Short Term Goal 3 (Week 1): Pt will perform squat pivot transfer w/ mod A. PT Short Term Goal 3 - Progress (Week 1): Progressing toward goal Week 2:  PT Short Term Goal 1 (Week 2): Pt will roll side to side w/ mod A. PT Short Term Goal 2 (Week 2): Pt will transfer sup to sit w/ mod A. PT Short Term Goal 3 (Week 2): Pt will perform squat pivot transfer w/ mod A. PT Short Term Goal 4 (Week 2): Pt will ambulate 30 ft using LRAD with maxA. PT Short Term Goal 5 (Week 2): Pt will initiate w/c mobility. Week 3:     Skilled Therapeutic Interventions/Progress Updates:  Patient supine in bed on entrance to room. Wife present. RN providing pt with laxative relating that pt has not had BM in 5 days. Patient alert and agreeable to PT session.   Patient with no pain complaint at start of session.  Therapeutic Activity: Initiated dressing while educating wife on pt positioning and sequencing donning of pants in supine.  Bed Mobility: Pt performed supine <> sit with ***. VC/ tc required for ***. Transfers: Pt performed sit<>stand and stand pivot transfers throughout session with ***. Provided verbal cues for***.  Gait Training:  Pt ambulated *** ft using *** with ***. Demonstrated ***. Provided vc/ tc for ***.  Wheelchair Mobility:  Pt propelled wheelchair *** feet with ***. Provided vc/ tc for ***.  Neuromuscular Re-ed: NMR facilitated during session with focus on***. Pt guided in ***. NMR  performed for improvements in motor control and coordination, balance, sequencing, judgement, and self confidence/ efficacy in performing all aspects of mobility at highest level of independence.   Therapeutic Exercise: Pt performed the following exercises with vc/ tc for proper technique. ***  Patient *** at end of session with brakes locked, *** alarm set, and all needs within reach.   Therapy Documentation Precautions:  Precautions Precautions: Fall Precaution Comments: R hemiplegia, slight "pusher", SBP <140 Restrictions Weight Bearing Restrictions: No General:   Vital Signs: Oxygen Therapy SpO2: 91 % O2 Device: Nasal Cannula O2 Flow Rate (L/min): 5 L/min Pain:   Mobility:   Locomotion :    Trunk/Postural Assessment :    Balance:   Exercises:   Other Treatments:      Therapy/Group: Individual Therapy  Loel Dubonnet 09/27/2023, 10:23 AM

## 2023-09-27 NOTE — Plan of Care (Signed)
Problem: RH Balance Goal: LTG: Patient will maintain dynamic sitting balance (OT) Description: LTG:  Patient will maintain dynamic sitting balance with assistance during activities of daily living (OT) Flowsheets (Taken 09/27/2023 1235) LTG: Pt will maintain dynamic sitting balance during ADLs with: (LTG downgraded due to slow progress.) Minimal Assistance - Patient > 75% Note: LTG downgraded to min A due to slow progress.  Goal: LTG Patient will maintain dynamic standing with ADLs (OT) Description: LTG:  Patient will maintain dynamic standing balance with assist during activities of daily living (OT)  Flowsheets (Taken 09/27/2023 1235) LTG: Pt will maintain dynamic standing balance during ADLs with: (LTG downgraded due to slow progress.) Maximal Assistance - Patient 25 - 49% Note: LTG downgraded to Max A due to slow progress.    Problem: Sit to Stand Goal: LTG:  Patient will perform sit to stand in prep for activites of daily living with assistance level (OT) Description: LTG:  Patient will perform sit to stand in prep for activites of daily living with assistance level (OT) Flowsheets (Taken 09/27/2023 1235) LTG: PT will perform sit to stand in prep for activites of daily living with assistance level: (LTG downgraded due to slow progress.) Moderate Assistance - Patient 50 - 74% Note: LTG downgraded mod A due to slow progress.    Problem: RH Eating Goal: LTG Patient will perform eating w/assist, cues/equip (OT) Description: LTG: Patient will perform eating with assist, with/without cues using equipment (OT) Flowsheets (Taken 09/27/2023 1235) LTG: Pt will perform eating with assistance level of: (LTG downgraded due to slow progress.) Supervision/Verbal cueing Note: LTG downgraded to supervision due to slow progress.    Problem: RH Dressing Goal: LTG Patient will perform upper body dressing (OT) Description: LTG Patient will perform upper body dressing with assist, with/without cues  (OT). Flowsheets (Taken 09/27/2023 1235) LTG: Pt will perform upper body dressing with assistance level of: (LTG downgraded due to slow progress.) Minimal Assistance - Patient > 75% Note: LTG downgraded to min A due to slow progress.  Goal: LTG Patient will perform lower body dressing w/assist (OT) Description: LTG: Patient will perform lower body dressing with assist, with/without cues in positioning using equipment (OT) Flowsheets (Taken 09/27/2023 1235) LTG: Pt will perform lower body dressing with assistance level of: (LTG downgraded due to slow progress.) Maximal Assistance - Patient 25 - 49% Note: LTG downgraded to max A  due to slow progress.    Problem: RH Toileting Goal: LTG Patient will perform toileting task (3/3 steps) with assistance level (OT) Description: LTG: Patient will perform toileting task (3/3 steps) with assistance level (OT)  Flowsheets (Taken 09/27/2023 1235) LTG: Pt will perform toileting task (3/3 steps) with assistance level: (LTG downgraded due to slow progress.) Maximal Assistance - Patient 25 - 49% Note: LTG downgraded to max A due to slow progress.    Problem: RH Toilet Transfers Goal: LTG Patient will perform toilet transfers w/assist (OT) Description: LTG: Patient will perform toilet transfers with assist, with/without cues using equipment (OT) Flowsheets (Taken 09/27/2023 1235) LTG: Pt will perform toilet transfers with assistance level of: (LTG downgraded due to slow progress.) Maximal Assistance - Patient 25 - 49% Note: LTG downgraded to max A due to slow progress.    Problem: RH Tub/Shower Transfers Goal: LTG Patient will perform tub/shower transfers w/assist (OT) Description: LTG: Patient will perform tub/shower transfers with assist, with/without cues using equipment (OT) Flowsheets (Taken 09/27/2023 1235) LTG: Pt will perform tub/shower stall transfers with assistance level of: (LTG discontinued at this time  as pt is not safe to get into the shower due  to impaired sitting balance and control.) -- Note: LTG discontinued at this time as pt is not safe to get into the shower due to impaired sitting balance and control.

## 2023-09-28 LAB — BASIC METABOLIC PANEL
Anion gap: 7 (ref 5–15)
BUN: 34 mg/dL — ABNORMAL HIGH (ref 8–23)
CO2: 25 mmol/L (ref 22–32)
Calcium: 9.3 mg/dL (ref 8.9–10.3)
Chloride: 118 mmol/L — ABNORMAL HIGH (ref 98–111)
Creatinine, Ser: 2 mg/dL — ABNORMAL HIGH (ref 0.61–1.24)
GFR, Estimated: 35 mL/min — ABNORMAL LOW (ref >60)
Glucose, Bld: 158 mg/dL — ABNORMAL HIGH (ref 70–99)
Potassium: 4.1 mmol/L (ref 3.5–5.1)
Sodium: 150 mmol/L — ABNORMAL HIGH (ref 135–145)

## 2023-09-28 MED ORDER — DEXTROSE-SODIUM CHLORIDE 5-0.2 % IV SOLN: INTRAVENOUS | Status: AC

## 2023-09-28 NOTE — Progress Notes (Signed)
Physical Therapy Session Note  Patient Details  Name: Kyle [name] MRN: [id number] Date of Birth: 1951/06/25  Today's Date: 09/28/2023 PT Individual Time: 1302-1352 PT Individual Time Calculation (min): 50 min   Short Term Goals: Week 2:  PT Short Term Goal 1 (Week 2): Pt will roll side to side w/ mod A. PT Short Term Goal 1 - Progress (Week 2): Progressing toward goal PT Short Term Goal 2 (Week 2): Pt will transfer sup to sit w/ mod A. PT Short Term Goal 2 - Progress (Week 2): Progressing toward goal PT Short Term Goal 3 (Week 2): Pt will perform squat pivot transfer w/ mod A. PT Short Term Goal 3 - Progress (Week 2): Progressing toward goal PT Short Term Goal 4 (Week 2): Pt will ambulate 30 ft using LRAD with maxA. PT Short Term Goal 4 - Progress (Week 2): Progressing toward goal PT Short Term Goal 5 (Week 2): Pt will initiate w/c mobility. PT Short Term Goal 5 - Progress (Week 2): Met Week 3:  PT Short Term Goal 1 (Week 3): Pt will roll side to side w/ mod A. PT Short Term Goal 2 (Week 3): Pt will perform supine<>to sit w/ mod A. PT Short Term Goal 3 (Week 3): Pt will perform squat pivot transfer w/ mod A. PT Short Term Goal 4 (Week 3): Pt will ambulate 30 ft using LRAD with maxA. PT Short Term Goal 5 (Week 3): Pt will propel w/c using hemitechnique and MinA  Skilled Therapeutic Interventions/Progress Updates:  Patient seated upright in TIS w/c on entrance to room. Patient alert and agreeable to PT session. [name] present.   Patient with no pain complaint at start of session but is squirming in chair from discomfort.   Therapeutic Activity: Transfers: Requested wife to participate in assisting pt to stand. Positioned wife to pt's uninvolved side and positioned her L arm for pt to grasp near her elbow and for her to grasp similarly to pt. Guided pt in rise to stand requiring MaxA from this therapist and ModA from wife. Allowed pt to attempt to stand with reducing support to  involved side in order for wife to more fully understand the level of assist that she will need to provide to pt. Assisted pt back to sit in w/c with MaxA +2.   Gait Training:  Performed sit<>stand into 3 musketeers technique with Mod/MaxA +2. Ambulates >33ft requiring block to R knee, advancement of R foot and vc throughout for sequencing steps and for increasing L step length. +3 for w/c and O2 follow.   Pt ambulated 34 ft using hallway HR on L side with MaxA for managing RLE as described above. Provided vc/ tc for sequencing reciprocating step, upright posture, and provided facilitation to R quad to initiate stance phase.  Pt guided in slideboard transfer back to bed. Educated wife and pt on safe and proper sequencing and use.. MaxA for positioning under L thigh and vc for pt to place L palm on other end of slideboard. Educated on head/ hips relationship and pt is able to initiate with light MinA. Then reaches for bedrail and with block provided to R knee for positioning, pt is able to complete to bed with CGA/ MinA. Removed board with MaxA and pt has difficulty maintaining balance on EOB without ModA. Returned to supine with Mod/ Max A.   Patient supine in bed at end of session with brakes locked, bed alarm set, and all needs within reach.  Therapy Documentation Precautions:  Precautions Precautions: Fall Precaution Comments: R hemiplegia, slight "pusher", SBP <140 Restrictions Weight Bearing Restrictions: No  Pain: Pain Assessment Pain Score: 0-No pain related this session.    Therapy/Group: Individual Therapy  Loel Dubonnet PT, DPT, CSRS 09/28/2023, 4:32 PM

## 2023-09-28 NOTE — Progress Notes (Signed)
PROGRESS NOTE   Subjective/Complaints:  No issues overnite , R shoulder pain "a little better"  discussed with PTA, had some left shoulder pain last week that appeared more myofascial and has improved with manual therapy    ROS: as per HPI. Denies CP, SOB, abd pain, N/V/D/C, or any other complaints at this time.    Objective:   No results found. Recent Labs    09/27/23 0720  WBC 7.6  HGB 12.6*  HCT 40.7  PLT 230    Recent Labs    09/28/23 0448  NA 150*  K 4.1  CL 118*  CO2 25  GLUCOSE 158*  BUN 34*  CREATININE 2.00*  CALCIUM 9.3     Intake/Output Summary (Last 24 hours) at 09/28/2023 0817 Last data filed at 09/28/2023 0800 Gross per 24 hour  Intake 480 ml  Output 1 ml  Net 479 ml         Physical Exam: Vital Signs Blood pressure (!) 149/64, pulse 80, temperature 98.1 F (36.7 C), resp. rate 18, height 5\' 11"  (1.803 m), weight 74.6 kg, SpO2 100%.  General: No acute distress Mood and affect are appropriate Heart: Regular rate and rhythm no rubs murmurs or extra sounds Lungs: Clear to auscultation, breathing unlabored, no rales or wheezes Abdomen: Positive bowel sounds, soft nontender to palpation, nondistended Extremities: No clubbing, cyanosis, or edema Skin: No evidence of breakdown, no evidence of rash  Neuro:  pt is alert and oriented. Follows basic commands. Fair insight and awareness. Speech with ongoing  dysarthria. Right central 7 remains. Antigravity and against resistance in left upper and lower extremities; flaccid right upper and lower extremity Flaccid tone -  RUE 0/5, RLE --trace HF/E and 0/5 KE/ADF/APF. Decreased light touch in  RUE.  MSK- no pain with R finger wrist or elbow ROM , 1.5FB sublux R shoulder , pain with PROM 90 deg abd    Assessment/Plan: 1. Functional deficits which require 3+ hours per day of interdisciplinary therapy in a comprehensive inpatient rehab  setting. Physiatrist is providing close team supervision and 24 hour management of active medical problems listed below. Physiatrist and rehab team continue to assess barriers to discharge/monitor patient progress toward functional and medical goals  Care Tool:  Bathing    Body parts bathed by patient: Right arm, Chest, Abdomen, Front perineal area, Right upper leg, Left upper leg, Face   Body parts bathed by helper: Right lower leg, Left lower leg, Buttocks, Front perineal area, Left arm     Bathing assist Assist Level: Maximal Assistance - Patient 24 - 49%     Upper Body Dressing/Undressing Upper body dressing   What is the patient wearing?: Pull over shirt    Upper body assist Assist Level: Minimal Assistance - Patient > 75%    Lower Body Dressing/Undressing Lower body dressing      What is the patient wearing?: Pants, Incontinence brief, Hospital gown only     Lower body assist Assist for lower body dressing: Maximal Assistance - Patient 25 - 49%     Toileting Toileting    Toileting assist Assist for toileting: Total Assistance - Patient < 25%     Transfers  Chair/bed transfer  Transfers assist     Chair/bed transfer assist level: Maximal Assistance - Patient 25 - 49%     Locomotion Ambulation   Ambulation assist   Ambulation activity did not occur: Safety/medical concerns  Assist level: Maximal Assistance - Patient 25 - 49% Assistive device: Other (comment) (hallway HR) Max distance: 30 ft   Walk 10 feet activity   Assist  Walk 10 feet activity did not occur: Safety/medical concerns  Assist level: Maximal Assistance - Patient 25 - 49% Assistive device: Other (comment) (hallway HR)   Walk 50 feet activity   Assist Walk 50 feet with 2 turns activity did not occur: Safety/medical concerns         Walk 150 feet activity   Assist Walk 150 feet activity did not occur: Safety/medical concerns         Walk 10 feet on uneven surface   activity   Assist Walk 10 feet on uneven surfaces activity did not occur: Safety/medical concerns         Wheelchair     Assist Is the patient using a wheelchair?: Yes Type of Wheelchair: Manual Wheelchair activity did not occur: Safety/medical concerns  Wheelchair assist level: Moderate Assistance - Patient 50 - 74% Max wheelchair distance: 50 ft    Wheelchair 50 feet with 2 turns activity    Assist        Assist Level: Moderate Assistance - Patient 50 - 74%   Wheelchair 150 feet activity     Assist      Assist Level: Dependent - Patient 0%   Blood pressure (!) 149/64, pulse 80, temperature 98.1 F (36.7 C), resp. rate 18, height 5\' 11"  (1.803 m), weight 74.6 kg, SpO2 100%.  Medical Problem List and Plan: 1. Functional deficits secondary to left basal ganglia ICH d/t hypertension/eliquis on 09/05/2023             -patient may shower             -ELOS/Goals: 10/07/23 goals min assist with PT, OT, SLP             - resting WHO and PRAFO    -Continue CIR therapies including PT, OT, and SLP       2.  Antithrombotics: -DVT/anticoagulation:Eliquis ( for A fib)  3. Pain Management: Tylenol prn. Voltaren gel QID.              -continue low dose gabapentin 100mg  at bedtime--dysesthetic pain improved Hemiplegic shoulder pain , may benefit from Zynex Estim device Cont scheduled acetaminophen  May need Right arm sling for transfers  4. Mood/Behavior/Sleep: LCSW to follow for evaluation and support.              -antipsychotic agents: N/A 5. Neuropsych/cognition: This patient is capable of making decisions on his own behalf. 6. Skin/Wound Care: Routine pressure relief measures. 7. Fluids/Electrolytes/Nutrition:               --added low salt restrictions. Add protein supplement    Intake of meals averaging ~25-50% Recorded intake of fluids variable 14mL-600mL       Latest Ref Rng & Units 09/28/2023    4:48 AM 09/24/2023    3:55 AM 09/20/2023   12:47 PM   BMP  Glucose 70 - 99 mg/dL 841  324  401   BUN 8 - 23 mg/dL 34  29  29   Creatinine 0.61 - 1.24 mg/dL 0.27  2.53  6.64   Sodium 135 -  145 mmol/L 150  144  143   Potassium 3.5 - 5.1 mmol/L 4.1  3.9  4.0   Chloride 98 - 111 mmol/L 118  112  106   CO2 22 - 32 mmol/L 25  23  25    Calcium 8.9 - 10.3 mg/dL 9.3  8.8  9.3      8. T2DM insulin dependent: Hgb A1c-6.2 on 09/06/23             -diet controlled -CBG's not being checked   9. PAD s/o aortobifemoral bypass w/right pseudoaneurysm: SBP goal < 140.  --Plans for repair early December by Dr. Sherral Hammers? 10. COPD w/chronic bronchitis/emphysema: currently on 6 L oxygen. Titrate to keep sats > 90% per Dr. Vassie Loll.              --on Trelegy PTA --started on Breo/Incruse today.              --pulmonary toilet. Weaning down to baseline as able.  11. CAF: Monitor HR TID. On coreg 6.25mg  BID. Off eliquis due to ICH- per Neuro may resume on 11/25 if stable --Neurology recommends resuming  Eliquis in 2 weeks if neuro and BP stable.    -09/27/23 HR well controlled  Vitals:   09/28/23 0538 09/28/23 0539  BP: (!) 149/64 (!) 149/64  Pulse: 80   Resp: 18   Temp: 98.1 F (36.7 C)   SpO2: 100%     12. Dilated cardiomyopathy/AS/chronic systolic CHF: Low salt diet. Check weight daily--up from 166-->173? --CXR 11/11 w/vascular congestion-->will order follow up CXR to check for fluid overload as cause of hypoxia.  --Monitor for signs of overload. On Lasix 20mg  daily, coreg 6.25mg  BID, Imdur 30mg  daily, hydralazine 100mg  TID -LVEF 55-60% -Weights stable , will d/c lasix due to elevated BUN/Creat  11/29 -weights generally trending down which I think is nutritional. -09/26/23 wt stable the last couple days  Filed Weights   09/25/23 0500 09/26/23 0509 09/27/23 0408  Weight: 74.9 kg 74.8 kg 74.6 kg    13. CKD: stage IIIb? -11/17 BUN/Cr stable at 33/1.82, 11/19 baseline creat likely in 1.6-1.8 range based on labs in system  Improving , fluid intake recorded  at 11/18 but only 236 mL on 11/19, Creat up, hold lasix starting 11/21  11/22- no labs today, +350cc yesterday--continue to hold lasix 11-23: BUN and creatinine remain elevated, unchanged from prior.  Output not recorded from last 2 days.  No signs of volume overload, continue to hold Lasix.  Encourage p.o. fluids and recheck Monday.    Latest Ref Rng & Units 09/28/2023    4:48 AM 09/24/2023    3:55 AM 09/20/2023   12:47 PM  BMP  Glucose 70 - 99 mg/dL 841  324  401   BUN 8 - 23 mg/dL 34  29  29   Creatinine 0.61 - 1.24 mg/dL 0.27  2.53  6.64   Sodium 135 - 145 mmol/L 150  144  143   Potassium 3.5 - 5.1 mmol/L 4.1  3.9  4.0   Chloride 98 - 111 mmol/L 118  112  106   CO2 22 - 32 mmol/L 25  23  25    Calcium 8.9 - 10.3 mg/dL 9.3  8.8  9.3    Elevated Na+, BUN/Creat mildly elevated over baseline , IVF hypotonic    14. Dysphagia: On D1, nectar- likely contributing to poor intake of liquids               -encourage fluids,  aspiration precautions   -see above  15. HTN: on amlodipine 10mg  daily, coreg 6.25mg  BID, clonidine 0.2mg  q8h, hydralazine 100mg  TID  BP reasonable off furosemide   12/2 controlled  Vitals:   09/24/23 1953 09/25/23 0426 09/25/23 1437 09/25/23 1938  BP: (!) 137/58 (!) 147/61 138/63 (!) 130/59   09/26/23 0533 09/26/23 1315 09/26/23 1917 09/27/23 0356  BP: (!) 147/65 136/63 125/60 132/61   09/27/23 0508 09/27/23 1314 09/28/23 0538 09/28/23 0539  BP: 132/61 138/67 (!) 149/64 (!) 149/64    16. Hx of seizure: continue keppra 500mg  BID- f/u neuro as OP   17. HLD: continue rosuvastatin 10mg  QHS  18.  Right hip pain no sig OA on Xray  19. Constipation:  -09/26/23 no BM documented since 09/22/23; will increase senokot S to 2 tabs BID since miralax might be difficult to achieve nectar thick; could consider other/additional laxative if no BM tomorrow    LOS: 18 days A FACE TO FACE EVALUATION WAS PERFORMED  Erick Colace 09/28/2023, 8:17 AM

## 2023-09-28 NOTE — Progress Notes (Signed)
Occupational Therapy Session Note  Patient Details  Name: Kyle [name] MRN: [id number] Date of Birth: 11/07/1950  Today's Date: 09/28/2023 OT Individual Time: 5638-7564 OT Individual Time Calculation (min): 55 min    Short Term Goals: Week 1:  OT Short Term Goal 1 (Week 1): The pt will complete UB bathing and dressing with MinA consistently at 95% safe OT Short Term Goal 1 - Progress (Week 1): Progressing toward goal OT Short Term Goal 2 (Week 1): The pt will complete LB bathing a dressing using AE at Mod A at 95% safe OT Short Term Goal 2 - Progress (Week 1): Progressing toward goal OT Short Term Goal 3 (Week 1): The pt will transfer to all surfaces with Mod A using the sliding board at 95% safe OT Short Term Goal 3 - Progress (Week 1): Progressing toward goal OT Short Term Goal 4 (Week 1): The pt will maintain good sit balance  consistently at 95% safe OT Short Term Goal 4 - Progress (Week 1): Progressing toward goal OT Short Term Goal 5 (Week 1): The pt will tolerate > 45 Minutes of therapy with minimal restbreaks at 95% safe. OT Short Term Goal 5 - Progress (Week 1): Progressing toward goal Week 2:  OT Short Term Goal 1 (Week 2): pt will demonstrate improved sitting balance to sit upright without back support consistently for at least 10 minutes at a time to enable him to engage in sefl care tasks at EOB. OT Short Term Goal 1 - Progress (Week 2): Progressing toward goal OT Short Term Goal 2 (Week 2): Pt will don shirt with mod A demonstrating improved attention and awareness of RUE. OT Short Term Goal 2 - Progress (Week 2): Progressing toward goal OT Short Term Goal 3 (Week 2): Pt will complete squat pivots w/c >< bed or mat to L and to R consistently with max A of 1 to progress to completing toilet transfers in a safe manner (pt currently requires a stedy lift) OT Short Term Goal 3 - Progress (Week 2): Progressing toward goal OT Short Term Goal 4 (Week 2): Pt will demonstrate  improved awareness of RUE by initiating reaching for R arm with L arm to prepare for bed mobility. OT Short Term Goal 4 - Progress (Week 2): Progressing toward goal Week 3:  OT Short Term Goal 1 (Week 3): STGs = LTGs  Skilled Therapeutic Interventions/Progress Updates:    Pt received in bed with wife in room. Pt stated he was not hurting today. He did have some c/o RLE pain with bed mobility.  Rehab tech present to assist.  Pt on 4.5 L of oxygen.  Pt sat to EOB with max A and then held balance with mod A as tech placed sliding board. Used slide board to transfer bed to wc to his right side with heavy mod A of 1.     Placed ace wrap sling around R arm to support arm.  Set w/c up next to bed with bed rail raised and bed elevated.  Had pt work on forward leaning to rise to stand with total A from OT to keep R leg stabilized and ensure he was not trying to move left leg out wider. Pt tends to try to scoot L foot out to side away from his base of support.  Rehab tech providing max A to support upper body as pt held onto rail with L hand. Frequent cues to move hip towards L bar to avoid pushing to  the R.   Tolerated a total of 3 stands for 20 seconds each. Max facilitation to move shoulders forward to lower to sit in controlled manner. On last stand trial, had pt close his eyes and do visualization exercises to imagine he was pushing up to stand with RLE.  Estim placed on R deltoids and wrist extensors using zynex unit at intensity 28 for 30 minutes.  Worked on arm and wrist PROM and then positioning with pt in w/c using pillows as needed.  Pt's arm not in pain today.    Belt alarm on and all needs met.      Therapy Documentation Precautions:  Precautions Precautions: Fall Precaution Comments: R hemiplegia, slight "pusher", SBP <140 Restrictions Weight Bearing Restrictions: No    Vital Signs: Therapy Vitals Temp: 97.6 F (36.4 C) Pulse Rate: 93 Resp: 18 BP: 115/66 Patient Position (if  appropriate): Sitting Oxygen Therapy SpO2: 92 % O2 Device: Nasal Cannula Pain: Pain Assessment Pain Score: 0-No pain ADL: ADL Equipment Provided:  (Stedyx2) Eating: Minimal assistance Where Assessed-Eating: Bed level Grooming: Minimal assistance Where Assessed-Grooming: Edge of bed Upper Body Bathing: Moderate assistance Where Assessed-Upper Body Bathing: Edge of bed Lower Body Bathing: Maximal assistance Where Assessed-Lower Body Bathing: Edge of bed Upper Body Dressing: Maximal assistance Where Assessed-Upper Body Dressing: Edge of bed Lower Body Dressing: Maximal assistance Where Assessed-Lower Body Dressing: Edge of bed Toileting: Dependent Where Assessed-Toileting: Bedside Commode (BSC over toilet) Toilet Transfer: Maximal assistance Toilet Transfer Method: Other (comment) (STEDY lift +2 A) Tub/Shower Transfer: Unable to assess Tub/Shower Transfer Method:  (UTA) Tub/Shower Equipment: Other (comment) (UTA) Walk-In Shower Transfer: Unable to assess Film/video editor Method: Unable to assess (UTA) Walk-In Shower Equipment: Other (comment) (UTA) ADL Comments: Patient inconsistent with unsupported sitting   Therapy/Group: Individual Therapy  Forrest Demuro 09/28/2023, 1:08 PM

## 2023-09-28 NOTE — Plan of Care (Signed)
Problem: RH Ambulation Goal: LTG Patient will ambulate in home environment (PT) Description: LTG: Patient will ambulate in home environment, # of feet with assistance (PT). Outcome: Not Applicable   Problem: RH Stairs Goal: LTG Patient will ambulate up and down stairs w/assist (PT) Description: LTG: Patient will ambulate up and down # of stairs with assistance (PT) Outcome: Not Applicable  Above two goals deemed not applicable as pt has not progressed to be a functional ambulator and recommendation will be not to ambulate with family and pt will not have progressed to ability to complete 15 steps in order to enter current home.     Problem: RH Balance Goal: LTG Patient will maintain dynamic sitting balance (PT) Description: LTG:  Patient will maintain dynamic sitting balance with assistance during mobility activities (PT) Flowsheets (Taken 09/28/2023 0542) LTG: Pt will maintain dynamic sitting balance during mobility activities with:: Contact Guard/Touching assist Goal: LTG Patient will maintain dynamic standing balance (PT) Description: LTG:  Patient will maintain dynamic standing balance with assistance during mobility activities (PT) Flowsheets (Taken 09/28/2023 0542) LTG: Pt will maintain dynamic standing balance during mobility activities with:: Maximal Assistance - Patient 25 - 49%   Problem: Sit to Stand Goal: LTG:  Patient will perform sit to stand with assistance level (PT) Description: LTG:  Patient will perform sit to stand with assistance level (PT) Flowsheets (Taken 09/28/2023 0542) LTG: PT will perform sit to stand in preparation for functional mobility with assistance level: Maximal Assistance - Patient 25 - 49%   Problem: RH Bed Mobility Goal: LTG Patient will perform bed mobility with assist (PT) Description: LTG: Patient will perform bed mobility with assistance, with/without cues (PT). Flowsheets (Taken 09/28/2023 0542) LTG: Pt will perform bed mobility with  assistance level of: Moderate Assistance - Patient 50 - 74%   Problem: RH Bed to Chair Transfers Goal: LTG Patient will perform bed/chair transfers w/assist (PT) Description: LTG: Patient will perform bed to chair transfers with assistance (PT). Flowsheets (Taken 09/28/2023 0542) LTG: Pt will perform Bed to Chair Transfers with assistance level: Moderate Assistance - Patient 50 - 74%   Problem: RH Car Transfers Goal: LTG Patient will perform car transfers with assist (PT) Description: LTG: Patient will perform car transfers with assistance (PT). Flowsheets (Taken 09/28/2023 0542) LTG: Pt will perform car transfers with assist:: Total Assistance - Patient < 25%   Problem: RH Furniture Transfers Goal: LTG Patient will perform furniture transfers w/assist (OT/PT) Description: LTG: Patient will perform furniture transfers  with assistance (OT/PT). Flowsheets (Taken 09/28/2023 0542) LTG: Pt will perform furniture transfers with assist:: Maximal Assistance - Patient 25 - 49%   Problem: RH Ambulation Goal: LTG Patient will ambulate in controlled environment (PT) Description: LTG: Patient will ambulate in a controlled environment, # of feet with assistance (PT). Flowsheets (Taken 09/28/2023 0542) LTG: Pt will ambulate in controlled environ  assist needed:: Maximal Assistance - Patient 25 - 49% LTG: Ambulation distance in controlled environment: 50 ft using LRAD   Problem: RH Wheelchair Mobility Goal: LTG Patient will propel w/c in controlled environment (PT) Description: LTG: Patient will propel wheelchair in controlled environment, # of feet with assist (PT) Flowsheets (Taken 09/28/2023 0542) LTG: Propel w/c distance in controlled environment: 100 ft Goal: LTG Patient will propel w/c in home environment (PT) Description: LTG: Patient will propel wheelchair in home environment, # of feet with assistance (PT). Flowsheets (Taken 09/28/2023 0542) Distance: wheelchair distance in controlled  environment: 100   Above goals required downgrading due to pt's inability  to progress 2/2 severity of stroke.

## 2023-09-28 NOTE — Progress Notes (Signed)
Physical Therapy Session Note  Patient Details  Name: Kyle [name] MRN: [id number] Date of Birth: [birth year]  Today's Date: 09/28/2023 PT Individual Time: 0805-0900 PT Individual Time Calculation (min): 55 min   Short Term Goals: Week 3:  PT Short Term Goal 1 (Week 3): Pt will roll side to side w/ mod A. PT Short Term Goal 2 (Week 3): Pt will perform supine<>to sit w/ mod A. PT Short Term Goal 3 (Week 3): Pt will perform squat pivot transfer w/ mod A. PT Short Term Goal 4 (Week 3): Pt will ambulate 30 ft using LRAD with maxA. PT Short Term Goal 5 (Week 3): Pt will propel w/c using hemitechnique and MinA  Skilled Therapeutic Interventions/Progress Updates: Patient supine in bed (HOB elevated) on entrance to room. Patient alert and agreeable to PT session.   Patient reported that L UE feels better vs last weak when pt thought he slept on it wrong. R UE subluxed with care team aware. PTA informed pt of today's session plan, which is to work on functional transfers. Pt adamant about not wanting to navigate stairs despite current level of function with standing as pt still requires max/total to stand/ambulate, and as more than 10 steps to navigate at home. Pt encouraged to see that pt's safety is number one, and with current d/c plan set to being a week away, it is crucial to work on other aspects to ensure progress where progress can be made in that span of time.  Therapeutic Activity: Bed Mobility: Pt performed bridge in bed at beginning of session to donn personal pants with maxA and max cues required. Pt required maxA to flex R knee in order to perform bridge with PTA providing maxA to stabilize. Pt then donned personal pants up to waist. Pt performed supine<>sit on EOB with maxA and max cues for sequence (provided hint cues at first but pt unable to recall without VC). Pt sitting EOB with max cues throughout to manage trunk control (occasional minA to prevent posterior lean) while conversing  with attending physician.  Transfers: Pt performed squat pivot transfers to the L from EOB<TIS at beginning of session with maxA and max VC to recall sequence. Pt transported to main gym in TIS with PTA managing O2 tank. Pt participated in multiple bouts of squat pivot transfers in order for pt to decrease required assistance during functional transfers when at home. From TIS<>edge of hi/low mat, pt required heavy mod/maxA at first with VC for sequence. Pt progressed to modA/heavy modA for 2 transfers, then back to maxA (although pt with decrease in required cues for sequence towards last few transfers). Pt required rest breaks throughout.   Patient sitting in TIS at end of session with brakes locked, SLP present, belt alarm set, and all needs within reach.      Therapy Documentation Precautions:  Precautions Precautions: Fall Precaution Comments: R hemiplegia, slight "pusher", SBP <140 Restrictions Weight Bearing Restrictions: No  Therapy/Group: Individual Therapy  Jerrica Thorman PTA 09/28/2023, 12:27 PM

## 2023-09-28 NOTE — Progress Notes (Signed)
Speech Language Pathology Daily Session Note  Patient Details  Name: Kyle [name] MRN: [id number] Date of Birth: Jun 17, 1951  Today's Date: 09/28/2023 SLP Individual Time: 0900-1000 SLP Individual Time Calculation (min): 60 min  Short Term Goals: Week 3: SLP Short Term Goal 1 (Week 3): Patient will utilize swallowing strategies during consumpution of D1/NTL diet to reduce s/sx of aspiration with min multimodal A SLP Short Term Goal 2 (Week 3): Patient will participate in dysphagia treatment to demonstrate a functional change in oropharyngeal swallow given supervisionA multimodal SLP Short Term Goal 3 (Week 3): Patient will increase speech intelligibility to 80% at the word level given min multimodal A  Skilled Therapeutic Interventions:  Skilled therapy session focused on dysphagia and communication goals. SLP facililated session by completing oral care and offering trials of D1/NTL. Patient with reduced anterior spillage and buccal pocketing this date given minA to follow strategies including small bites/sips, slow rate, and effortful swallows. Patient with no s/sx of aspiration this date and clear vocal quality throughout. Recommend continuation of current diet and full supervision level. SLP completed oral care after PO was complete to ensure oral clearance. SLP continued to address dysphagia through pharyngeal strengthing exercises including chin tuck against resistance and supraglottic swallows. Patient completed 10x CTAR and x5 supraglottic swallows given minA. SLP addressed communication throught review of speech intelligibility strategies. Patient did not recall speech strategies, therefore SLP re-educated. Patient then able to utilize strategies during single words beginning with /l/ and /r/. Patient approxiamately 90% intellibile at the word level this date given minA to utilize strategies. Patient left in TIS [facility] with alarm set and call bell in reach. Continue POC.    Pain None reported    Therapy/Group: Individual Therapy  Lativia Velie M.A., CF-SLP 09/28/2023, 11:37 AM

## 2023-09-29 LAB — BASIC METABOLIC PANEL
Anion gap: 7 (ref 5–15)
BUN: 35 mg/dL — ABNORMAL HIGH (ref 8–23)
CO2: 25 mmol/L (ref 22–32)
Calcium: 8.9 mg/dL (ref 8.9–10.3)
Chloride: 114 mmol/L — ABNORMAL HIGH (ref 98–111)
Creatinine, Ser: 1.76 mg/dL — ABNORMAL HIGH (ref 0.61–1.24)
GFR, Estimated: 41 mL/min — ABNORMAL LOW (ref >60)
Glucose, Bld: 189 mg/dL — ABNORMAL HIGH (ref 70–99)
Potassium: 4.2 mmol/L (ref 3.5–5.1)
Sodium: 146 mmol/L — ABNORMAL HIGH (ref 135–145)

## 2023-09-29 NOTE — Progress Notes (Signed)
Orthopedic Tech Progress Note Patient Details:  Kyle [name] [birth year] [id number] AFO Consult has been ordered from [facility]  Patient ID: Kyle [name], male   DOB: 19-Jan-1951, 72 y.o.   MRN: [id number]  Kyle [name] 09/29/2023, 10:37 AM

## 2023-09-29 NOTE — Progress Notes (Signed)
PROGRESS NOTE   Subjective/Complaints:  Discussed inaccessibility of his 2nf floor walk up apt, need for ambulance transport for discharge as well as for any MD visits    ROS: as per HPI. Denies CP, SOB, abd pain, N/V/D/C, or any other complaints at this time.    Objective:   No results found. Recent Labs    09/27/23 0720  WBC 7.6  HGB 12.6*  HCT 40.7  PLT 230    Recent Labs    09/28/23 0448 09/29/23 0501  NA 150* 146*  K 4.1 4.2  CL 118* 114*  CO2 25 25  GLUCOSE 158* 189*  BUN 34* 35*  CREATININE 2.00* 1.76*  CALCIUM 9.3 8.9     Intake/Output Summary (Last 24 hours) at 09/29/2023 0749 Last data filed at 09/28/2023 1801 Gross per 24 hour  Intake 490.94 ml  Output --  Net 490.94 ml         Physical Exam: Vital Signs Blood pressure (!) 154/64, pulse 74, temperature 97.8 F (36.6 C), resp. rate 16, height 5\' 11"  (1.803 m), weight 73 kg, SpO2 94%.  General: No acute distress Mood and affect are appropriate Heart: Regular rate and rhythm no rubs murmurs or extra sounds Lungs: Clear to auscultation, breathing unlabored, no rales or wheezes Abdomen: Positive bowel sounds, soft nontender to palpation, nondistended Extremities: No clubbing, cyanosis, or edema Skin: No evidence of breakdown, no evidence of rash  Neuro:  pt is alert and oriented. Follows basic commands. Fair insight and awareness. Speech with ongoing  dysarthria. Right central 7 remains. Antigravity and against resistance in left upper and lower extremities; flaccid right upper and lower extremity Flaccid tone -  RUE 0/5, RLE --trace HF/E and 0/5 KE/ADF/APF. Decreased light touch in  RUE.  MSK- no pain with R finger wrist or elbow ROM , 1.5FB sublux R shoulder , pain with PROM 90 deg abd, mild pain with end range     Assessment/Plan: 1. Functional deficits which require 3+ hours per day of interdisciplinary therapy in a comprehensive  inpatient rehab setting. Physiatrist is providing close team supervision and 24 hour management of active medical problems listed below. Physiatrist and rehab team continue to assess barriers to discharge/monitor patient progress toward functional and medical goals  Care Tool:  Bathing    Body parts bathed by patient: Right arm, Chest, Abdomen, Front perineal area, Right upper leg, Left upper leg, Face   Body parts bathed by helper: Right lower leg, Left lower leg, Buttocks, Front perineal area, Left arm     Bathing assist Assist Level: Maximal Assistance - Patient 24 - 49%     Upper Body Dressing/Undressing Upper body dressing   What is the patient wearing?: Pull over shirt    Upper body assist Assist Level: Minimal Assistance - Patient > 75%    Lower Body Dressing/Undressing Lower body dressing      What is the patient wearing?: Pants, Incontinence brief, Hospital gown only     Lower body assist Assist for lower body dressing: Maximal Assistance - Patient 25 - 49%     Toileting Toileting    Toileting assist Assist for toileting: Total Assistance - Patient <  25%     Transfers Chair/bed transfer  Transfers assist     Chair/bed transfer assist level: Maximal Assistance - Patient 25 - 49%     Locomotion Ambulation   Ambulation assist   Ambulation activity did not occur: Safety/medical concerns  Assist level: Maximal Assistance - Patient 25 - 49% Assistive device: Other (comment) (hallway HR) Max distance: 30 ft   Walk 10 feet activity   Assist  Walk 10 feet activity did not occur: Safety/medical concerns  Assist level: Maximal Assistance - Patient 25 - 49% Assistive device: Other (comment) (hallway HR)   Walk 50 feet activity   Assist Walk 50 feet with 2 turns activity did not occur: Safety/medical concerns         Walk 150 feet activity   Assist Walk 150 feet activity did not occur: Safety/medical concerns         Walk 10 feet on  uneven surface  activity   Assist Walk 10 feet on uneven surfaces activity did not occur: Safety/medical concerns         Wheelchair     Assist Is the patient using a wheelchair?: Yes Type of Wheelchair: Manual Wheelchair activity did not occur: Safety/medical concerns  Wheelchair assist level: Moderate Assistance - Patient 50 - 74% Max wheelchair distance: 50 ft    Wheelchair 50 feet with 2 turns activity    Assist        Assist Level: Moderate Assistance - Patient 50 - 74%   Wheelchair 150 feet activity     Assist      Assist Level: Dependent - Patient 0%   Blood pressure (!) 154/64, pulse 74, temperature 97.8 F (36.6 C), resp. rate 16, height 5\' 11"  (1.803 m), weight 73 kg, SpO2 94%.  Medical Problem List and Plan: 1. Functional deficits secondary to left basal ganglia ICH d/t hypertension/eliquis on 09/05/2023             -patient may shower             -ELOS/Goals: 10/07/23 goals min assist with PT, OT, SLP             - resting WHO and PRAFO    -Continue CIR therapies including PT, OT, and SLP       2.  Antithrombotics: -DVT/anticoagulation:Eliquis ( for A fib)  3. Pain Management: Tylenol prn. Voltaren gel QID.              -continue low dose gabapentin 100mg  at bedtime--dysesthetic pain improved Hemiplegic shoulder pain , may benefit from Zynex Estim device Cont scheduled acetaminophen  May need Right arm sling for transfers  4. Mood/Behavior/Sleep: LCSW to follow for evaluation and support.              -antipsychotic agents: N/A 5. Neuropsych/cognition: This patient is capable of making decisions on his own behalf. 6. Skin/Wound Care: Routine pressure relief measures. 7. Fluids/Electrolytes/Nutrition:               --added low salt restrictions. Add protein supplement    Intake of meals averaging ~25-50% Recorded intake of fluids variable 125mL-600mL       Latest Ref Rng & Units 09/29/2023    5:01 AM 09/28/2023    4:48 AM 09/24/2023     3:55 AM  BMP  Glucose 70 - 99 mg/dL 578  469  629   BUN 8 - 23 mg/dL 35  34  29   Creatinine 0.61 - 1.24 mg/dL 5.28  4.13  1.89   Sodium 135 - 145 mmol/L 146  150  144   Potassium 3.5 - 5.1 mmol/L 4.2  4.1  3.9   Chloride 98 - 111 mmol/L 114  118  112   CO2 22 - 32 mmol/L 25  25  23    Calcium 8.9 - 10.3 mg/dL 8.9  9.3  8.8      8. T2DM insulin dependent: Hgb A1c-6.2 on 09/06/23             -diet controlled -CBG's not being checked   9. PAD s/o aortobifemoral bypass w/right pseudoaneurysm: SBP goal < 140.  --Plans for repair early December by Dr. Sherral Hammers? 10. COPD w/chronic bronchitis/emphysema: currently on 6 L oxygen. Titrate to keep sats > 90% per Dr. Vassie Loll.              --on Trelegy PTA --started on Breo/Incruse today.              --pulmonary toilet. Weaning down to baseline as able.  11. CAF: Monitor HR TID. On coreg 6.25mg  BID. Off eliquis due to ICH- per Neuro may resume on 11/25 if stable --Neurology recommends resuming  Eliquis in 2 weeks if neuro and BP stable.    -09/27/23 HR well controlled  Vitals:   09/29/23 0510 09/29/23 0514  BP: (!) 154/64 (!) 154/64  Pulse: 74   Resp: 16   Temp: 97.8 F (36.6 C)   SpO2: 94%    Sys BP mildly elevated due to IVF  12. Dilated cardiomyopathy/AS/chronic systolic CHF: Low salt diet. Check weight daily--up from 166-->173? --CXR 11/11 w/vascular congestion-->will order follow up CXR to check for fluid overload as cause of hypoxia.  --Monitor for signs of overload. On Lasix 20mg  daily, coreg 6.25mg  BID, Imdur 30mg  daily, hydralazine 100mg  TID -LVEF 55-60% -Weights stable , will d/c lasix due to elevated BUN/Creat   Filed Weights   09/26/23 0509 09/27/23 0408 09/29/23 0646  Weight: 74.8 kg 74.6 kg 73 kg    13. CKD: stage IIIb? -11/17 BUN/Cr stable at 33/1.82, 11/19 baseline creat likely in 1.6-1.8 range based on labs in system  Improving , fluid intake recorded at 11/18 but only 236 mL on 11/19, Creat up, hold lasix  starting 11/21  11/22- no labs today, +350cc yesterday--continue to hold lasix 11-23: BUN and creatinine remain elevated, unchanged from prior.  Output not recorded from last 2 days.  No signs of volume overload, continue to hold Lasix.  Encourage p.o. fluids and recheck Monday.    Latest Ref Rng & Units 09/29/2023    5:01 AM 09/28/2023    4:48 AM 09/24/2023    3:55 AM  BMP  Glucose 70 - 99 mg/dL 161  096  045   BUN 8 - 23 mg/dL 35  34  29   Creatinine 0.61 - 1.24 mg/dL 4.09  8.11  9.14   Sodium 135 - 145 mmol/L 146  150  144   Potassium 3.5 - 5.1 mmol/L 4.2  4.1  3.9   Chloride 98 - 111 mmol/L 114  118  112   CO2 22 - 32 mmol/L 25  25  23    Calcium 8.9 - 10.3 mg/dL 8.9  9.3  8.8    Elevated Na+, BUN/Creat mildly elevated over baseline , IVF hypotonic cont for additional 24h recheck BMET in am     14. Dysphagia: On D1, nectar- likely contributing to poor intake of liquids               -  encourage fluids, aspiration precautions   -see above  15. HTN: on amlodipine 10mg  daily, coreg 6.25mg  BID, clonidine 0.2mg  q8h, hydralazine 100mg  TID  BP reasonable off furosemide   12/4 controlled  Vitals:   09/26/23 1315 09/26/23 1917 09/27/23 0356 09/27/23 0508  BP: 136/63 125/60 132/61 132/61   09/27/23 1314 09/28/23 0538 09/28/23 0539 09/28/23 1307  BP: 138/67 (!) 149/64 (!) 149/64 115/66   09/28/23 1957 09/28/23 2042 09/29/23 0510 09/29/23 0514  BP: (!) 130/59 (!) 130/59 (!) 154/64 (!) 154/64    16. Hx of seizure: continue keppra 500mg  BID- f/u neuro as OP   17. HLD: continue rosuvastatin 10mg  QHS  18.  Right hip pain no sig OA on Xray  19. Constipation:  -09/26/23 no BM documented since 09/22/23; will increase senokot S to 2 tabs BID since miralax might be difficult to achieve nectar thick; could consider other/additional laxative if no BM tomorrow    LOS: 19 days A FACE TO FACE EVALUATION WAS PERFORMED  Erick Colace 09/29/2023, 7:49 AM

## 2023-09-29 NOTE — Patient Care Conference (Signed)
Inpatient RehabilitationTeam Conference and Plan of Care Update Date: 09/29/2023   Time: 10:03 AM    Patient Name: Kyle [name]      Medical Record Number: [id number]  Date of Birth: 04-19-1951 Sex: Male         Room/Bed: 4M03C/4M03C-01 Payor Info: Payor: [employer] MEDICARE / Plan: [employer] MEDICARE HMO / Product Type: *No Product type* /    Admit Date/Time:  09/10/2023  3:04 PM  Primary Diagnosis:  ICH (intracerebral hemorrhage) [facility])  Hospital Problems: Principal Problem:   ICH (intracerebral hemorrhage) [facility])    Expected Discharge Date: Expected Discharge Date: 10/07/23  Team Members Present: Physician leading conference: Dr. Claudette Laws Social Worker Present: Lavera Guise, BSW Nurse Present: Chana Bode, RN PT Present: Casimiro Needle, PT OT Present: Mariann Barter, OT SLP Present: Everardo Pacific, SLP PPS Coordinator present : Fae Pippin, SLP     Current Status/Progress Goal Weekly Team Focus  Bowel/Bladder   incontinent of b/b [facility] 12/2   regain continence   offer toileting with time toileting q4hrs and PRN    Swallow/Nutrition/ Hydration   D1/NTL   supA  continue to address dysphagia through use of pharyngeal strengthening exercises and D1/NTL trials to reduce pocketing and anterior spillage    ADL's   introduced sliding board with mod A of 1 but need second person to help with placement of board and stabilizing the board, no change in self care as he continues to need mod A UB dressing and Max LB self care   LTGs downgraded a 2nd time - min A bathing and UB dressing, max LB dressing, toileting, toilet transfers   RUE NMR, estim, balance, R side attention, ADL training, pt/family education    Mobility   Bed Mobility = MaxA, Transfers = overal MaxA (inconsistent with modA during squat pivot) - Sliding board = total/maxA to set up, minA for initiation and sequence to L, and modA to the R. Stairs (safety concern - not attempted); Ambulation - MaxA/totalA  overall (3 muskateer 65')   Downgraded to maxA overall  Barriers: Pt continues to exhibit impulsivity, lack of awarenes, minimal R hemibody activation, decreased awareness to deficits (does not want to give up on stair navigation despite informed educaiton on CLOF and how unsafe it is to d/c); Focus - Estim, family ed, functional transfers for decreased caregiver burden, pt education, overall awareness, standing/sitting balance.    Communication   up to 90% intelligible at word level given minA   sup A   implementation of compensatory strategies, focusing on /r/ and /l/ blends, cluster reduction    Safety/Cognition/ Behavioral Observations  minA for temporal orientation   sup A   use of external aids given supA    Pain   patient complains of chronic shoulder pain   <3/10 on pain scale   assess pain qshift and PRN    Skin   skin is intact   maintain skin integrity  assess skin qshift and PRN encouraging q2hr turns and proper nutrition      Discharge Planning:  Discharging home with spouse to assist primarily. Family education started.  Barrier: 15 steps to enter apartment.   Team Discussion: Patient post ICH with heavy right hemiplegia; little progress noted.  Patient on target to meet rehab goals: Currently needs mod assist for upper body self care and max assist for lower body care. Needs mod assist +1 for transfers to the right and min assist for transfers to the left. Anticipate hoyer lift needed at  discharge and slide-board; do not anticipate ambulation at discharge.  Working on Producer, television/film/video.     *See Care Plan and progress notes for long and short-term goals.   Revisions to Treatment Plan:  Downgraded goals to max assist AFO consult IVF for dehydration   Teaching Needs: Safety, medications, dietary modification, transfers, toileting, etc.   Current Barriers to Discharge: Decreased caregiver support and Home enviroment  access/layout  Possible Resolutions to Barriers: Family education Ambulance transport to home [facility] follow up services DME: TTB, BSC, Hoyer lift, and slide board     Medical Summary Current Status: No movement RUE, RLE, sitting balance slightly improved, hypernatremia requiring IVF  Barriers to Discharge: Medical stability   Possible Resolutions to Barriers/Weekly Focus: IV fluids, monitor BMET, family training   Continued Need for Acute Rehabilitation Level of Care: The patient requires daily medical management by a physician with specialized training in physical medicine and rehabilitation for the following reasons: Direction of a multidisciplinary physical rehabilitation program to maximize functional independence : Yes Medical management of patient stability for increased activity during participation in an intensive rehabilitation regime.: Yes Analysis of laboratory values and/or radiology reports with any subsequent need for medication adjustment and/or medical intervention. : Yes   I attest that I was present, lead the team conference, and concur with the assessment and plan of the team.   Chana Bode B 09/29/2023, 3:01 PM

## 2023-09-29 NOTE — Progress Notes (Signed)
Speech Language Pathology Daily Session Note  Patient Details  Name: Kyle [name] MRN: [id number] Date of Birth: 03-14-1951  Today's Date: 09/29/2023 SLP Individual Time: 0800-0900 SLP Individual Time Calculation (min): 60 min  Short Term Goals: Week 3: SLP Short Term Goal 1 (Week 3): Patient will utilize swallowing strategies during consumpution of D1/NTL diet to reduce s/sx of aspiration with min multimodal A SLP Short Term Goal 2 (Week 3): Patient will participate in dysphagia treatment to demonstrate a functional change in oropharyngeal swallow given supervisionA multimodal SLP Short Term Goal 3 (Week 3): Patient will increase speech intelligibility to 80% at the word level given min multimodal A  Skilled Therapeutic Interventions: Skilled therapy session focused on dysphagia and communication goals. SLP facililated session by completing oral care and offering trials of D1/NTL off breakfast tray. Patient with reduced anterior spillage and buccal pocketing compared to prior given minA to follow strategies including small bites/sips, slow rate, and effortful swallows. Patient with occasional b/l throat clears versus groans. Recommend continuation of current diet and full supervision level. SLP completed oral care after PO was complete to ensure oral clearance. SLP continued to address dysphagia through pharyngeal strengthing exercises including chin tuck against resistance and supraglottic swallows. Patient completed 10x CTAR given supervision A and x5 supraglottic swallows given minA. SLP addressed communication throught review of speech intelligibility strategies. Patient recalled 2/4 strategies given maxA. Patient then able to utilize strategies during /p/ and /b/ minimal pairs. Patient approxiamately 85% intelligible at the word level this date given supervisionA to utilize strategies. Of note, patient incontinent of bladder during session and followed all single step directions independently  during peri care. Patient left in bed with alarm set and call bell in reach. Continue POC.   Pain Denies  Therapy/Group: Individual Therapy  Jaquis Picklesimer M.A., CF-SLP  09/29/2023, 8:58 AM

## 2023-09-29 NOTE — Progress Notes (Signed)
Physical Therapy Session Note  Patient Details  Name: Kyle [name] MRN: [id number] Date of Birth: 11-Oct-1951  Today's Date: 09/29/2023 PT Individual Time: 1000-1055 PT Individual Time Calculation (min): 55 min   Short Term Goals: Week 3:  PT Short Term Goal 1 (Week 3): Pt will roll side to side w/ mod A. PT Short Term Goal 2 (Week 3): Pt will perform supine<>to sit w/ mod A. PT Short Term Goal 3 (Week 3): Pt will perform squat pivot transfer w/ mod A. PT Short Term Goal 4 (Week 3): Pt will ambulate 30 ft using LRAD with maxA. PT Short Term Goal 5 (Week 3): Pt will propel w/c using hemitechnique and MinA  Skilled Therapeutic Interventions/Progress Updates: Pt presented in bed with wife present agreeable to therapy. Wife completing trimming nails and asking if this therapist can bathe pt. Explained that due to time limitations and that pt has x 2 OT sessions later today bathing more appropriate for OT. With increased time and additional explanation wife verbalized understanding. Pt then became perseverative on oral care. Pt refused to transfer OOB to complete oral care and refused to use toothbrush as he wanted the suction brush. Pt boosted up in bed and provided with suction brush. Pt was able to complete oral care with increased time and use of LUE. Pt then directed care for applying denture adhesive which took more than a reasonable amount of time. Once completed pt required maxA for supine to sit with max multimodal cues for sequencing. Required modA for anterior scooting with pt initially displaying poor trunk control and posterior lean. Pt able to correct with cues and was able to maintain sitting balance with minA fading to close supervision. PTA donned sling and shoes/AFO. Pt then completed squat pivot transfer to TIS with maxA requiring facilitation for anterior lean and pushing through LLE. Pt set up in TIS and left with wife and RN present and current needs met.      Therapy  Documentation Precautions:  Precautions Precautions: Fall Precaution Comments: R hemiplegia, slight "pusher", SBP <140 Restrictions Weight Bearing Restrictions: No    Therapy/Group: Individual Therapy  Breeanne Oblinger 09/29/2023, 1:24 PM

## 2023-09-29 NOTE — Progress Notes (Signed)
Occupational Therapy Session Note  Patient Details  Name: Kyle [name] MRN: [id number] Date of Birth: 05-02-1951  Today's Date: 09/29/2023 OT Individual Time: 1307-1405 OT Individual Time Calculation (min): 58 min    Short Term Goals: Week 3:  OT Short Term Goal 1 (Week 3): STGs = LTGs  Skilled Therapeutic Interventions/Progress Updates:  Pt greeted seated in TIS, pts wife and rehab tech present, pt agreeable to OT intervention.      Transfers/bed mobility: pt completed SB transfer back to bed at end of session with total A for board placement and overall MAX A +1. Pt required cues for head/hips relationship. Pt required MODA for sit>supine needing assist to elevate BLEs to EOB.    ADLs:  Grooming: pt perseverating on his dentures not being donned correctly, removed dentures per pts directions and cleaned them with total A. Pt then requested for oral care with suction toothbrush, provided oral care with total A for cleanliness.   NMR:  Pt completed sit>stands in gym at // bars with MAX  +1, with mirror provided for visual feedback, emphasis on activation of glutes to compensate for impaired L quad activation. Graded task up, with pt instructed to reach out of BOS with LUE to facilitate increased weightbearing into LLE to retrieve squigz from mirror, emphasis on weight shifting R<>L as precursor to functional gait. Pt completed task with MAX A +1. LUE supported in sling during task for increased support.   LUE secured to unweighted dowel rod with pt completing below therex for NMR and to facilitate improved AAROM in LUE. Pt  needed MOD support to complete therex on LUE, mirror provided for visual feedback - chest presses -shoulder flexion to 90* only  -forward rows  General: pt on 3L [place] at start of session, increased pt to 4L during session as pt desat as low as 85%, able to titrate pt back to 3L by end of session.   Ended session with pt supine in bed with all needs within reach and  bed alarm activated.                    Therapy Documentation Precautions:  Precautions Precautions: Fall Precaution Comments: R hemiplegia, slight "pusher", SBP <140 Restrictions Weight Bearing Restrictions: No  Pain: no pain reported during session     Therapy/Group: Individual Therapy  Pollyann Glen [facility] 09/29/2023, 3:01 PM

## 2023-09-29 NOTE — Progress Notes (Signed)
Patient ID: Kyle [name], male   DOB: March 26, 1951, 72 y.o.   MRN: [id number]  Team Conference Report to Patient/Family  Team Conference discussion was reviewed with the patient and caregiver, including goals, any changes in plan of care and target discharge date.  Patient and caregiver express understanding and are in agreement.  The patient has a target discharge date of 10/07/23.  Sw met with patient and spouse in the room to discuss team conference updates. Spouse has expressed on multiple occasions that she will be the primary caregiver for the patient. Pt's children mostly able to assist on weekends. Pt has a son in [place] but the son is a [employer] architect and unable to assist daily/24/7. Daughter and her spouse live 2 hours away.  Spouse has expressed that she is unable to care for the patient independently at home. Spouse has spoken with her daughter and discussed the potential of hiring HC in home, daughter unable to assist with the financial responsibility of [facility].   Spouse is interested in ST rehab, patient not agreeable to plan and wishes to d/c home. SW will follow up with spouse in the AM to allow the pt and family time to discuss their plan/option.  If family is agreeable to SNF, SW will reach out to insurance tomorrow for authorization. SW will continue to follow up with patient and spouse. Family education with daughter will remain for Saturday.   Andria Rhein 09/29/2023, 1:39 PM

## 2023-09-29 NOTE — Progress Notes (Signed)
Occupational Therapy Session Note  Patient Details  Name: Kyle [name] MRN: [id number] Date of Birth: 1951-06-08  Today's Date: 09/29/2023 OT Individual Time: 1130-1204 OT Individual Time Calculation (min): 34 min    Short Term Goals: Week 3:  OT Short Term Goal 1 (Week 3): STGs = LTGs  Skilled Therapeutic Interventions/Progress Updates:    Patient received tilted in wheelchair with wife at bedside.  Patient initially declined OT but with encouragement was willing to participate.  Patient transported along with supplemental O2 and IV pole.  Focus of session on postural control, sitting balance, and functional transfers.  Worked on seated forward flexion sufficient to [employer] seat for lift off.  Patient able to flex forward but when initially asked to push with legs, he pulled himself backward.  Reset patient's feet on floor and loaded feet with more forward trunk flexion - patient then able to actively push through legs and assist with pivot to either right or left direction.  Mod assist of 1 person for transfer - second person available as needed for management of tubing, stabilizing chair.  Worked on static to dynamic sitting balance and used RUE as part of base of support.  Worked on slow/ controlled weight shift into right environment without loss of balance.  Patient perseverating on lip care - and redirected to task at hand.  Left up in wheelchair at end of session with safety belt in place and engaged and call bell in reach.  Wife remains at bedside, providing denture care.    Therapy Documentation Precautions:  Precautions Precautions: Fall Precaution Comments: R hemiplegia, slight "pusher", SBP <140 Restrictions Weight Bearing Restrictions: No   Pain:  Denies pain    Therapy/Group: Individual Therapy  Collier Salina 09/29/2023, 1:51 PM

## 2023-09-30 ENCOUNTER — Encounter (HOSPITAL_COMMUNITY): Payer: Medicare HMO

## 2023-09-30 MED ORDER — SENNOSIDES-DOCUSATE SODIUM 8.6-50 MG PO TABS
3.0000 | ORAL_TABLET | Freq: Two times a day (BID) | ORAL | Status: DC
  Administered 2023-09-30 – 2023-10-06 (×12): 3 via ORAL
  Filled 2023-09-30 (×13): qty 3

## 2023-09-30 MED ORDER — SORBITOL 70 % SOLN
30.0000 mL | Freq: Once | Status: AC
  Administered 2023-09-30: 30 mL via ORAL
  Filled 2023-09-30: qty 30

## 2023-09-30 MED ORDER — DEXTROSE-SODIUM CHLORIDE 5-0.2 % IV SOLN: INTRAVENOUS | Status: DC

## 2023-09-30 NOTE — Progress Notes (Signed)
Occupational Therapy Session Note  Patient Details  Name: Kyle [name] MRN: [id number] Date of Birth: 1951-10-03  Today's Date: 09/30/2023 OT Individual Time: 4696-2952 OT Individual Time Calculation (min): 41 min    Short Term Goals: Week 3:  OT Short Term Goal 1 (Week 3): STGs = LTGs  Skilled Therapeutic Interventions/Progress Updates:  Pt greeted supine in bed, NT present assisting pt with denture care.  pt agreeable to OT intervention.      Transfers/bed mobility: pt completed supine>sit with MODA +2 with assist needed to elevate trunk into sitting, maneuver BLEs to EOB, and scoot hips to EOB. Pt completed sit>stand from EOB to pull pants to waist line with MAX A, +2 assist needed from tech to pull pants to waist line. RLE blocked during sit>stand with RUE supported. Pt completed squat pivot to TIS to R side with MAX A +2 for safety. MIN verbal cues needed for head/hips relationship and anterior weight shift.   ADLs:  Grooming: total A for oral care. Pt perseverates on the placement of his dentures. Noted bleeding at gums, provided mouth wash for cleanliness and performed oral care with suction toothbrush with total A. Nurse aware of bleeding, utilized gauze to attempt to clot bleeding.  UB dressing: pt donned OH shirt with MODA from EOB. LB dressing: pt donned pants from EOB with MAX A Footwear: donned shoes and AFO with total A from EOB   Ended session with pt seated in TIS with all needs within reach and safety belt alarm activated.                    Therapy Documentation Precautions:  Precautions Precautions: Fall Precaution Comments: R hemiplegia, slight "pusher", SBP <140 Restrictions Weight Bearing Restrictions: Yes  Pain: No pain reported during session    Therapy/Group: Individual Therapy  Pollyann Glen [facility] 09/30/2023, 12:08 PM

## 2023-09-30 NOTE — Progress Notes (Signed)
PROGRESS NOTE   Subjective/Complaints:  Discussed mobility issues with wife yesterday , discussed with pt that his 2nd floor apt is inaccessible to him Pt does not remember is he had blood draw this am or if IVF ran last noc    ROS: as per HPI. Denies CP, SOB, abd pain, N/V/D/C, or any other complaints at this time.    Objective:   No results found. No results for input(s): "WBC", "HGB", "HCT", "PLT" in the last 72 hours.   Recent Labs    09/28/23 0448 09/29/23 0501  NA 150* 146*  K 4.1 4.2  CL 118* 114*  CO2 25 25  GLUCOSE 158* 189*  BUN 34* 35*  CREATININE 2.00* 1.76*  CALCIUM 9.3 8.9     Intake/Output Summary (Last 24 hours) at 09/30/2023 0740 Last data filed at 09/29/2023 2030 Gross per 24 hour  Intake 238 ml  Output --  Net 238 ml         Physical Exam: Vital Signs Blood pressure (!) 182/78, pulse 78, temperature 98 F (36.7 C), resp. rate 20, height 5\' 11"  (1.803 m), weight 73.8 kg, SpO2 93%.  General: No acute distress Mood and affect are appropriate Heart: Regular rate and rhythm no rubs murmurs or extra sounds Lungs: Clear to auscultation, breathing unlabored, no rales or wheezes Abdomen: Positive bowel sounds, soft nontender to palpation, nondistended Extremities: No clubbing, cyanosis, Skin: No evidence of breakdown, no evidence of rash Mild dorsal hand edema  No edema in LE Neuro:  pt is alert and oriented. Follows basic commands. Fair insight and awareness. Speech with ongoing  dysarthria. Right central 7 remains. Antigravity and against resistance in left upper and lower extremities; flaccid right upper and lower extremity Flaccid tone -  RUE 0/5, RLE --trace HF/E and 0/5 KE/ADF/APF. Decreased light touch in  RUE.  MSK- no pain with R finger wrist or elbow ROM , 1.5FB sublux R shoulder , pain with PROM 90 deg abd, mild pain with end range     Assessment/Plan: 1. Functional deficits  which require 3+ hours per day of interdisciplinary therapy in a comprehensive inpatient rehab setting. Physiatrist is providing close team supervision and 24 hour management of active medical problems listed below. Physiatrist and rehab team continue to assess barriers to discharge/monitor patient progress toward functional and medical goals  Care Tool:  Bathing    Body parts bathed by patient: Right arm, Chest, Abdomen, Front perineal area, Right upper leg, Left upper leg, Face   Body parts bathed by helper: Right lower leg, Left lower leg, Buttocks, Front perineal area, Left arm     Bathing assist Assist Level: Maximal Assistance - Patient 24 - 49%     Upper Body Dressing/Undressing Upper body dressing   What is the patient wearing?: Pull over shirt    Upper body assist Assist Level: Minimal Assistance - Patient > 75%    Lower Body Dressing/Undressing Lower body dressing      What is the patient wearing?: Pants, Incontinence brief, Hospital gown only     Lower body assist Assist for lower body dressing: Maximal Assistance - Patient 25 - 49%     Toileting Toileting  Toileting assist Assist for toileting: Total Assistance - Patient < 25%     Transfers Chair/bed transfer  Transfers assist     Chair/bed transfer assist level: Maximal Assistance - Patient 25 - 49% (sb transfer)     Locomotion Ambulation   Ambulation assist   Ambulation activity did not occur: Safety/medical concerns  Assist level: Maximal Assistance - Patient 25 - 49% Assistive device: Other (comment) (hallway HR) Max distance: 30 ft   Walk 10 feet activity   Assist  Walk 10 feet activity did not occur: Safety/medical concerns  Assist level: Maximal Assistance - Patient 25 - 49% Assistive device: Other (comment) (hallway HR)   Walk 50 feet activity   Assist Walk 50 feet with 2 turns activity did not occur: Safety/medical concerns         Walk 150 feet activity   Assist  Walk 150 feet activity did not occur: Safety/medical concerns         Walk 10 feet on uneven surface  activity   Assist Walk 10 feet on uneven surfaces activity did not occur: Safety/medical concerns         Wheelchair     Assist Is the patient using a wheelchair?: Yes Type of Wheelchair: Manual Wheelchair activity did not occur: Safety/medical concerns  Wheelchair assist level: Moderate Assistance - Patient 50 - 74% Max wheelchair distance: 50 ft    Wheelchair 50 feet with 2 turns activity    Assist        Assist Level: Moderate Assistance - Patient 50 - 74%   Wheelchair 150 feet activity     Assist      Assist Level: Dependent - Patient 0%   Blood pressure (!) 182/78, pulse 78, temperature 98 F (36.7 C), resp. rate 20, height 5\' 11"  (1.803 m), weight 73.8 kg, SpO2 93%.  Medical Problem List and Plan: 1. Functional deficits secondary to left basal ganglia ICH d/t hypertension/eliquis on 09/05/2023             -patient may shower             -ELOS/Goals: 10/07/23 goals min assist with PT, OT, SLP             - resting WHO and PRAFO    -Continue CIR therapies including PT, OT, and SLP      Rec SNF, requires 2 caregivers, no sig progress expected in short term , 2nd floor walk up inaccessible, SW to address  2.  Antithrombotics: -DVT/anticoagulation:Eliquis ( for A fib)  3. Pain Management: Tylenol prn. Voltaren gel QID.              -continue low dose gabapentin 100mg  at bedtime--dysesthetic pain improved Hemiplegic shoulder pain , may benefit from Zynex Estim device Cont scheduled acetaminophen  May need Right arm sling for transfers  4. Mood/Behavior/Sleep: LCSW to follow for evaluation and support.              -antipsychotic agents: N/A 5. Neuropsych/cognition: This patient is capable of making decisions on his own behalf. 6. Skin/Wound Care: Routine pressure relief measures. 7. Fluids/Electrolytes/Nutrition:               --added low salt  restrictions. Add protein supplement    Intake of meals averaging ~25-50% Recorded intake of fluids variable 151mL-600mL       Latest Ref Rng & Units 09/29/2023    5:01 AM 09/28/2023    4:48 AM 09/24/2023    3:55 AM  BMP  Glucose 70 - 99 mg/dL 147  829  562   BUN 8 - 23 mg/dL 35  34  29   Creatinine 0.61 - 1.24 mg/dL 1.30  8.65  7.84   Sodium 135 - 145 mmol/L 146  150  144   Potassium 3.5 - 5.1 mmol/L 4.2  4.1  3.9   Chloride 98 - 111 mmol/L 114  118  112   CO2 22 - 32 mmol/L 25  25  23    Calcium 8.9 - 10.3 mg/dL 8.9  9.3  8.8      8. T2DM insulin dependent: Hgb A1c-6.2 on 09/06/23             -diet controlled -CBG's not being checked   9. PAD s/o aortobifemoral bypass w/right pseudoaneurysm: SBP goal < 140.  --Plans for repair early December by Dr. Sherral Hammers? 10. COPD w/chronic bronchitis/emphysema: currently on 6 L oxygen. Titrate to keep sats > 90% per Dr. Vassie Loll.              --on Trelegy PTA --started on Breo/Incruse today.              --pulmonary toilet. Weaning down to baseline as able.  11. CAF: Monitor HR TID. On coreg 6.25mg  BID. Off eliquis due to ICH- per Neuro may resume on 11/25 if stable --Neurology recommends resuming  Eliquis in 2 weeks if neuro and BP stable.    -09/27/23 HR well controlled  Vitals:   09/30/23 0538 09/30/23 0733  BP: (!) 182/78   Pulse: 78   Resp: 20   Temp: 98 F (36.7 C)   SpO2: 94% 93%   Sys BP mildly elevated due to IVF  12. Dilated cardiomyopathy/AS/chronic systolic CHF: Low salt diet. Check weight daily--up from 166-->173? --CXR 11/11 w/vascular congestion-->will order follow up CXR to check for fluid overload as cause of hypoxia.  --Monitor for signs of overload. On Lasix 20mg  daily, coreg 6.25mg  BID, Imdur 30mg  daily, hydralazine 100mg  TID -LVEF 55-60% -Weights stable , will d/c lasix due to elevated BUN/Creat   Filed Weights   09/27/23 0408 09/29/23 0646 09/30/23 0538  Weight: 74.6 kg 73 kg 73.8 kg    13. CKD: stage  IIIb? -11/17 BUN/Cr stable at 33/1.82, 11/19 baseline creat likely in 1.6-1.8 range based on labs in system  Improving , fluid intake recorded at 11/18 but only 236 mL on 11/19, Creat up, hold lasix starting 11/21  11/22- no labs today, +350cc yesterday--continue to hold lasix 11-23: BUN and creatinine remain elevated, unchanged from prior.  Output not recorded from last 2 days.  No signs of volume overload, continue to hold Lasix.  Encourage p.o. fluids and recheck Monday.    Latest Ref Rng & Units 09/29/2023    5:01 AM 09/28/2023    4:48 AM 09/24/2023    3:55 AM  BMP  Glucose 70 - 99 mg/dL 696  295  284   BUN 8 - 23 mg/dL 35  34  29   Creatinine 0.61 - 1.24 mg/dL 1.32  4.40  1.02   Sodium 135 - 145 mmol/L 146  150  144   Potassium 3.5 - 5.1 mmol/L 4.2  4.1  3.9   Chloride 98 - 111 mmol/L 114  118  112   CO2 22 - 32 mmol/L 25  25  23    Calcium 8.9 - 10.3 mg/dL 8.9  9.3  8.8    Elevated Na+, BUN/Creat mildly elevated over baseline , IVF hypotonic cont for  additional 24h recheck BMET in am     14. Dysphagia: On D1, nectar- likely contributing to poor intake of liquids               -encourage fluids, aspiration precautions   -see above  15. HTN: on amlodipine 10mg  daily, coreg 6.25mg  BID, clonidine 0.2mg  q8h, hydralazine 100mg  TID  BP reasonable off furosemide   12/4 controlled  Vitals:   09/27/23 0508 09/27/23 1314 09/28/23 0538 09/28/23 0539  BP: 132/61 138/67 (!) 149/64 (!) 149/64   09/28/23 1307 09/28/23 1957 09/28/23 2042 09/29/23 0510  BP: 115/66 (!) 130/59 (!) 130/59 (!) 154/64   09/29/23 0514 09/29/23 1417 09/29/23 2023 09/30/23 0538  BP: (!) 154/64 (!) 147/60 (!) 156/68 (!) 182/78   Up due to IVF will monitor  but may need to increase coreg  16. Hx of seizure: continue keppra 500mg  BID- f/u neuro as OP   17. HLD: continue rosuvastatin 10mg  QHS  18.  Right hip pain no sig OA on Xray  19. Constipation:  -09/26/23 no BM documented since 09/22/23; will increase  senokot S to 2 tabs BID since miralax might be difficult to achieve nectar thick; no BM x 3 d     LOS: 20 days A FACE TO FACE EVALUATION WAS PERFORMED  Erick Colace 09/30/2023, 7:40 AM

## 2023-09-30 NOTE — Progress Notes (Signed)
Occupational Therapy Session Note  Patient Details  Name: Kyle [name] MRN: [id number] Date of Birth: 1950-11-02  Today's Date: 09/30/2023 OT Individual Time: 7829-5621 OT Individual Time Calculation (min): 40 min    Short Term Goals: Week 1:  OT Short Term Goal 1 (Week 1): The pt will complete UB bathing and dressing with MinA consistently at 95% safe OT Short Term Goal 1 - Progress (Week 1): Progressing toward goal OT Short Term Goal 2 (Week 1): The pt will complete LB bathing a dressing using AE at Mod A at 95% safe OT Short Term Goal 2 - Progress (Week 1): Progressing toward goal OT Short Term Goal 3 (Week 1): The pt will transfer to all surfaces with Mod A using the sliding board at 95% safe OT Short Term Goal 3 - Progress (Week 1): Progressing toward goal OT Short Term Goal 4 (Week 1): The pt will maintain good sit balance  consistently at 95% safe OT Short Term Goal 4 - Progress (Week 1): Progressing toward goal OT Short Term Goal 5 (Week 1): The pt will tolerate > 45 Minutes of therapy with minimal restbreaks at 95% safe. OT Short Term Goal 5 - Progress (Week 1): Progressing toward goal Week 2:  OT Short Term Goal 1 (Week 2): pt will demonstrate improved sitting balance to sit upright without back support consistently for at least 10 minutes at a time to enable him to engage in sefl care tasks at EOB. OT Short Term Goal 1 - Progress (Week 2): Progressing toward goal OT Short Term Goal 2 (Week 2): Pt will don shirt with mod A demonstrating improved attention and awareness of RUE. OT Short Term Goal 2 - Progress (Week 2): Progressing toward goal OT Short Term Goal 3 (Week 2): Pt will complete squat pivots w/c >< bed or mat to L and to R consistently with max A of 1 to progress to completing toilet transfers in a safe manner (pt currently requires a stedy lift) OT Short Term Goal 3 - Progress (Week 2): Progressing toward goal OT Short Term Goal 4 (Week 2): Pt will demonstrate  improved awareness of RUE by initiating reaching for R arm with L arm to prepare for bed mobility. OT Short Term Goal 4 - Progress (Week 2): Progressing toward goal Week 3:  OT Short Term Goal 1 (Week 3): STGs = LTGs  Skilled Therapeutic Interventions/Progress Updates:     Pt received in bed on bed pan stating he was going to the bathroom. Notified techs I would return at 1130.       Neuromuscular Re-Education:  -placed estim on pt's shoulder and forearm at start of session so it could run during the treatment time.  Worked on having pt attend to R fingers opening.  Pt can feel estim but is not actively engaging in it well as he needs frequent cues to focus on his hand. Estim used in a more passive capacity during the session as pt engaging in balance and postural control work.  -pt moved supine to sit with 1 person A of mod A -max A.  -mod - max A initially at EOB with pt frequently leaning back. Cues to keep shoulders over thighs.   - used theraband around pt's torso for error augmentation and sensory feedback as pt could feel resistance on his back if he leaned back. Rehab tech present to frequently guard pt from falling back too far.  -also use band around torso with L hand resting  on therapy ball on bed. Pt worked on L lateral wt shift and returning to midline with band providing sensory feedback. Pt did fairly well with this activity and towards the end of the session able to progress with weight shifts L to midline without falling back He did become very fatigued at end of session.  Moved back to supine with +2 A and adjusted in bed.   Estim removed at end of session. No adverse effects.    Pt resting in bed with all needs met. Alarm set and call light in reach.     Therapy Documentation Precautions:  Precautions Precautions: Fall Precaution Comments: R hemiplegia, slight "pusher", SBP <140 Restrictions Weight Bearing Restrictions: Yes    Vital Signs: Therapy Vitals Temp: 98  F (36.7 C) Pulse Rate: 78 Resp: 20 BP: (!) 182/78 Patient Position (if appropriate): Lying Oxygen Therapy SpO2: 93 % O2 Device: Nasal Cannula O2 Flow Rate (L/min): 4 L/min Pain:   ADL: ADL Equipment Provided:  (Stedyx2) Eating: Minimal assistance Where Assessed-Eating: Bed level Grooming: Minimal assistance Where Assessed-Grooming: Edge of bed Upper Body Bathing: Moderate assistance Where Assessed-Upper Body Bathing: Edge of bed Lower Body Bathing: Maximal assistance Where Assessed-Lower Body Bathing: Edge of bed Upper Body Dressing: Maximal assistance Where Assessed-Upper Body Dressing: Edge of bed Lower Body Dressing: Maximal assistance Where Assessed-Lower Body Dressing: Edge of bed Toileting: Dependent Where Assessed-Toileting: Bedside Commode (BSC over toilet) Toilet Transfer: Maximal assistance Toilet Transfer Method: Other (comment) (STEDY lift +2 A) Tub/Shower Transfer: Unable to assess Tub/Shower Transfer Method:  (UTA) Tub/Shower Equipment: Other (comment) (UTA) Walk-In Shower Transfer: Unable to assess Film/video editor Method: Unable to assess (UTA) Walk-In Shower Equipment: Other (comment) (UTA) ADL Comments: Patient inconsistent with unsupported sitting   Therapy/Group: Individual Therapy  Gwenetta Devos 09/30/2023, 8:40 AM

## 2023-09-30 NOTE — Progress Notes (Signed)
Speech Language Pathology Weekly Progress and Session Note  Patient Details  Name: Kyle [name] MRN: [id number] Date of Birth: Sep 05, 1951  Beginning of progress report period: September 25, 2023 End of progress report period: September 30, 2023  Today's Date: 09/30/2023 SLP Individual Time: 1030-1105 SLP Individual Time Calculation (min): 35 min  Short Term Goals: Week 3: SLP Short Term Goal 1 (Week 3): Patient will utilize swallowing strategies during consumpution of D1/NTL diet to reduce s/sx of aspiration with min multimodal A SLP Short Term Goal 1 - Progress (Week 3): Met SLP Short Term Goal 2 (Week 3): Patient will participate in dysphagia treatment to demonstrate a functional change in oropharyngeal swallow given supervisionA multimodal SLP Short Term Goal 2 - Progress (Week 3): Not met SLP Short Term Goal 3 (Week 3): Patient will increase speech intelligibility to 80% at the word level given min multimodal A SLP Short Term Goal 3 - Progress (Week 3): Met    New Short Term Goals: Week 4: SLP Short Term Goal 1 (Week 4): STG=LTG due to ELOS  Weekly Progress Updates: Pt has made good gains and has met 2 of 3 STGs this reporting period due to improved dysphagia and communication. Currently, patient continues to require min A for utilization of swallowing strategies and exercises(effortful swallow, lingual sweep) during consumption of D1/NTL. Of note, patient with significantly decreased buccal pocketing and anterior spillage this reporting period compared to prior. Patient continues to benefit from [facility] for use of speech intelligibility strategies at the word level to increase intelligibility to above 80%  Pt/family eduction ongoing. Pt would benefit from continued ST intervention to maximize dysphagia, cognition and dysarthria in order to maximize functional independence at d/c.    Intensity: Minumum of 1-2 x/day, 30 to 90 minutes Frequency: 3 to 5 out of 7 days Duration/Length of  Stay: 12/12 Treatment/Interventions: Cognitive remediation/compensation;Internal/external aids;Speech/Language facilitation;Cueing hierarchy;Therapeutic Activities;Functional tasks;Multimodal communication approach;Patient/family education;Therapeutic Exercise;Dysphagia/aspiration precaution training   Daily Session  Skilled Therapeutic Interventions:  Skilled therapy session focused on orientation, dysphagia, and speech intelligibility. SLP faciliated session by providing patient with external aid to recall date. With use of calendar, patient able to recall date independently. SLP addressed dysphagia goals through providing oral care and minA during completion of pharyngeal strengthening activities. Excercises included chin tuck against resisitance (x10) and supraglottic swallows (x5).  Patient refused PO this date. SLP addressed speech through review of strategies and providing minA for usage. SLP provided patient with list of single words in which patient was 80% intelligibile given min cues. Remaining 10 minutes of session missed due to toileting.  Patient left in bed with alarm set and call bell in reach. NT present. Continue POC.      Pain None reported   Therapy/Group: Individual Therapy  Sheila Gervasi M.A., CF-SLP 09/30/2023, 12:16 PM

## 2023-09-30 NOTE — NC FL2 (Signed)
[place] MEDICAID FL2 LEVEL OF CARE FORM     IDENTIFICATION  Patient Name: Kyle [name] Birthdate: 1951/02/18 Sex: male Admission Date (Current Location): 09/10/2023  [facility] [place] Number:  Electrical engineer and Address:  [facility] [place], [address], [place], [name] [address]      Provider Number:    Attending Physician Name and Address:  Erick Colace, MD  Relative Name and Phone Number:  [name] [phone]    Current Level of Care: Hospital Recommended Level of Care: Skilled Nursing Facility Prior Approval Number:    Date Approved/Denied:   PASRR Number: [id number]  Discharge Plan: SNF    Current Diagnoses: Patient Active Problem List   Diagnosis Date Noted   Pseudoaneurysm (HCC) 09/10/2023   ICH (intracerebral hemorrhage) (HCC) 09/10/2023   Hemorrhagic stroke (HCC) 09/06/2023   Hand pain, right 10/12/2022   Chronic respiratory failure with hypoxia (HCC) 01/29/2022   Atrial flutter (HCC)    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HCC) 02/20/2020   Pulmonary nodules/lesions, multiple 01/29/2020   Anticoagulant long-term use 01/29/2020   SOB (shortness of breath)    Atrial fibrillation, chronic (HCC) 12/24/2019   Diabetes mellitus type 2, insulin dependent (HCC) 12/24/2019   Healthcare maintenance 07/14/2019   Medication management 07/14/2019   PAD (peripheral artery disease) (HCC) 04/22/2019   Atherosclerosis of artery of extremity with rest pain (HCC) 04/21/2019   Deformity of metatarsal bone of right foot 04/17/2019   COPD (chronic obstructive pulmonary disease) (HCC) 12/16/2018   Elevated PSA 12/27/2017   Protein-calorie malnutrition, severe (HCC) 12/18/2014   Aortoiliac occlusive disease (HCC) 12/16/2014   PVD (peripheral vascular disease) (HCC) 10/24/2014   Aortic stenosis 09/12/2014   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   Pulmonary hypertension (HCC) 09/12/2014   Dyslipidemia 09/12/2014    Atherosclerosis of native arteries of extremity with intermittent claudication (HCC) 05/11/2012   Aortic insufficiency    Hypertension    Peripheral arterial disease (HCC)    Dilated cardiomyopathy (HCC)    Chronic systolic CHF (congestive heart failure) (HCC)     Orientation RESPIRATION BLADDER Height & Weight     Self, Time, Situation, Place  Normal Incontinent Weight: 162 lb 11.2 oz (73.8 kg) Height:  5\' 11"  (180.3 cm)  BEHAVIORAL SYMPTOMS/MOOD NEUROLOGICAL BOWEL NUTRITION STATUS      Incontinent Diet (D1/NTL)  AMBULATORY STATUS COMMUNICATION OF NEEDS Skin   Extensive Assist Verbally Normal                       Personal Care Assistance Level of Assistance  Bathing, Feeding, Dressing Bathing Assistance: Limited assistance Feeding assistance: Limited assistance Dressing Assistance: Limited assistance     Functional Limitations Info             SPECIAL CARE FACTORS FREQUENCY  PT (By licensed PT), OT (By licensed OT), Speech therapy     PT Frequency: 5x a week OT Frequency: 5x a week Bowel and Bladder Program Frequency: 5x a week   Speech Therapy Frequency: 5x a week      Contractures Contractures Info: Not present    Additional Factors Info  Code Status Code Status Info: FULL             Current Medications (09/30/2023):  This is the current hospital active medication list Current Facility-Administered Medications  Medication Dose Route Frequency Provider Last Rate Last Admin   acetaminophen (TYLENOL) tablet 650 mg  650 mg Oral TID Claudette Laws  E, MD   650 mg at 09/30/23 0823   albuterol (PROVENTIL) (2.5 MG/3ML) 0.083% nebulizer solution 3 mL  3 mL Inhalation Q6H PRN Love, Pamela S, PA-C       alum & mag hydroxide-simeth (MAALOX/MYLANTA) 200-200-20 MG/5ML suspension 30 mL  30 mL Oral Q4H PRN Love, Pamela S, PA-C       amLODipine (NORVASC) tablet 10 mg  10 mg Oral Daily Jacquelynn Cree, PA-C   10 mg at 09/30/23 2952   apixaban (ELIQUIS) tablet 5 mg   5 mg Oral BID Pham, Minh Q, RPH-CPP   5 mg at 09/30/23 8413   bisacodyl (DULCOLAX) suppository 10 mg  10 mg Rectal Daily PRN Jacquelynn Cree, PA-C   10 mg at 09/22/23 1408   carvedilol (COREG) tablet 6.25 mg  6.25 mg Oral BID WC Jacquelynn Cree, PA-C   6.25 mg at 09/30/23 2440   cloNIDine (CATAPRES) tablet 0.2 mg  0.2 mg Oral Q8H Love, Pamela S, PA-C   0.2 mg at 09/30/23 0537   dextrose 5 % and 0.2 % NaCl infusion   Intravenous Continuous Erick Colace, MD 40 mL/hr at 09/30/23 0825 New Bag at 09/30/23 0825   diclofenac Sodium (VOLTAREN) 1 % topical gel 2 g  2 g Topical QID Love, Pamela S, PA-C   2 g at 09/30/23 0535   diphenhydrAMINE (BENADRYL) capsule 25 mg  25 mg Oral Q6H PRN Love, Pamela S, PA-C       feeding supplement (ENSURE ENLIVE / ENSURE PLUS) liquid 237 mL  237 mL Oral TID BM Kirsteins, Victorino Sparrow, MD   237 mL at 09/30/23 1055   fluticasone furoate-vilanterol (BREO ELLIPTA) 200-25 MCG/ACT 1 puff  1 puff Inhalation Daily Love, Evlyn Kanner, PA-C   1 puff at 09/29/23 1027   And   umeclidinium bromide (INCRUSE ELLIPTA) 62.5 MCG/ACT 1 puff  1 puff Inhalation Daily Jacquelynn Cree, PA-C   1 puff at 09/30/23 2536   gabapentin (NEURONTIN) capsule 100 mg  100 mg Oral QHS Ranelle Oyster, MD   100 mg at 09/29/23 2015   guaiFENesin-dextromethorphan (ROBITUSSIN DM) 100-10 MG/5ML syrup 5-10 mL  5-10 mL Oral Q6H PRN Love, Pamela S, PA-C       hydrALAZINE (APRESOLINE) tablet 100 mg  100 mg Oral TID Love, Pamela S, PA-C   100 mg at 09/30/23 6440   hydrALAZINE (APRESOLINE) tablet 25 mg  25 mg Oral Q6H PRN Jacquelynn Cree, PA-C   25 mg at 09/13/23 1639   isosorbide mononitrate (IMDUR) 24 hr tablet 30 mg  30 mg Oral Daily Jacquelynn Cree, PA-C   30 mg at 09/30/23 3474   levETIRAcetam (KEPPRA) tablet 500 mg  500 mg Oral BID Jacquelynn Cree, PA-C   500 mg at 09/30/23 2595   mirtazapine (REMERON) tablet 7.5 mg  7.5 mg Oral QHS Faith Rogue T, MD   7.5 mg at 09/29/23 2033   Oral care mouth rinse  15 mL Mouth  Rinse PRN Love, Evlyn Kanner, PA-C       Oral care mouth rinse  15 mL Mouth Rinse 4 times per day Erick Colace, MD   15 mL at 09/30/23 6387   Oral care mouth rinse  15 mL Mouth Rinse PRN Kirsteins, Victorino Sparrow, MD       pantoprazole (PROTONIX) EC tablet 40 mg  40 mg Oral Daily Jacquelynn Cree, PA-C   40 mg at 09/30/23 5643   prochlorperazine (COMPAZINE) tablet  5-10 mg  5-10 mg Oral Q6H PRN Love, Pamela S, PA-C       Or   prochlorperazine (COMPAZINE) suppository 12.5 mg  12.5 mg Rectal Q6H PRN Love, Pamela S, PA-C       Or   prochlorperazine (COMPAZINE) injection 5-10 mg  5-10 mg Intravenous Q6H PRN Love, Pamela S, PA-C       rosuvastatin (CRESTOR) tablet 10 mg  10 mg Oral QHS Love, Pamela S, PA-C   10 mg at 09/29/23 2015   senna-docusate (Senokot-S) tablet 3 tablet  3 tablet Oral BID Erick Colace, MD   3 tablet at 09/30/23 6010   sodium phosphate (FLEET) enema 1 enema  1 enema Rectal Once PRN Love, Pamela S, PA-C       traZODone (DESYREL) tablet 25-50 mg  25-50 mg Oral QHS PRN Love, Pamela S, PA-C   50 mg at 09/25/23 2048     Discharge Medications: Please see discharge summary for a list of discharge medications.  Relevant Imaging Results:  Relevant Lab Results:   Additional Information    Andria Rhein

## 2023-09-30 NOTE — Progress Notes (Signed)
Physical Therapy Session Note  Patient Details  Name: Kyle [name] MRN: [id number] Date of Birth: 03-05-1951  Today's Date: 09/30/2023 PT Individual Time: 0906-1005 PT Individual Time Calculation (min): 59 min   Short Term Goals: Week 3:  PT Short Term Goal 1 (Week 3): Pt will roll side to side w/ mod A. PT Short Term Goal 2 (Week 3): Pt will perform supine<>to sit w/ mod A. PT Short Term Goal 3 (Week 3): Pt will perform squat pivot transfer w/ mod A. PT Short Term Goal 4 (Week 3): Pt will ambulate 30 ft using LRAD with maxA. PT Short Term Goal 5 (Week 3): Pt will propel w/c using hemitechnique and MinA  Skilled Therapeutic Interventions/Progress Updates: Patient sitting in TIS on entrance to room. Patient alert and agreeable to PT session.   Patient reported no pain at beginning of PT session.  Therapeutic Activity: Bed Mobility: Pt performed sit to supine from EOB with mod/heavy modA. VC required for pt to advance R LE onto bed with maxA (pt able to advance L LE onto bed). Pt cued to bridge hips in order to adjust self in bed with PTA stabilizing B knees, and maxA for pt to flex R LE. Transfers: Pt performed slide board transfer from TIS to edge of hi/low mat to the L with minA (totalA/maxA for setup). Provided VC for sequence/hand placement. Pt performed lateral scoot from edge of mat<TIS with VC to pivot hips in direction of chair, hand placement of L UE (hi/low mat elevated to meet TIS seat for level scoot - overall min/modA).   Neuromuscular Re-ed: NMR facilitated during session with focus on static sitting balance, neuromuscular connection of R LE quadriceps, WB for increased proprioceptive feedback. - Pt sitting on hi/low mat with rehab tech behind to assist with sitting balance prn. Pt participated in muscle energy technique of contract relax with PTA providing very light resistance for the first several reps (R LE quadriceps for LAQ - with very minimal activation). Pt then with  mirror feedback and instructions to perform LAQ on L LE while looking at movement, pt then to perform B LAQ while looking at R LE to promote increase in neuromuscular connection. Pt then participated in muscle energy technique again on R LE with VC to extend knee on R side as the same time on L side with mirror feedback. Pt with noted increase in quadriceps activation on R LE with noted increase in strength (still minimal, but increase from first attempt prior).  - Pt then performed x 4 sit to stands from hi/low mat with PTA on R side blocking R knee for buckle prevention, as well as approximating at knee. Pt with maxA to stand on first 3, then mod/heavy mod on third. Pt with max cues to maintain postural control while standing, and to extend at hips for upright posture (maxA). Pt also required cues to anteriorly weight shift with use of L UE to push off mat. Pt presented with inability to statically stand without maxA to control posture in neutral position.   NMR performed for improvements in motor control and coordination, balance, sequencing, judgement, and self confidence/ efficacy in performing all aspects of mobility at highest level of independence.   Patient supine in bed at end of session with brakes locked, bed alarm set, and all needs within reach.      Therapy Documentation Precautions:  Precautions Precautions: Fall Precaution Comments: R hemiplegia, slight "pusher", SBP <140 Restrictions Weight Bearing Restrictions: Yes  Therapy/Group:  Individual Therapy  Elanah Osmanovic PTA 09/30/2023, 12:18 PM

## 2023-09-30 NOTE — Progress Notes (Signed)
Patient ID: Kyle [name], male   DOB: 07-01-1951, 72 y.o.   MRN: [id number]  SW received call from patient spouse. Pt, spouse and daughter are agreeable with attempting SNF placement. Spouse and daughter will be present tomorrow to obtain list SNF options in [place]. SW will wait to submit authorization until family has made their determination on facility. No additional questions or concerns

## 2023-10-01 ENCOUNTER — Ambulatory Visit (HOSPITAL_BASED_OUTPATIENT_CLINIC_OR_DEPARTMENT_OTHER): Payer: Medicare HMO

## 2023-10-01 DIAGNOSIS — F54 Psychological and behavioral factors associated with disorders or diseases classified elsewhere: Secondary | ICD-10-CM

## 2023-10-01 LAB — BASIC METABOLIC PANEL
Anion gap: 6 (ref 5–15)
BUN: 29 mg/dL — ABNORMAL HIGH (ref 8–23)
CO2: 24 mmol/L (ref 22–32)
Calcium: 9 mg/dL (ref 8.9–10.3)
Chloride: 113 mmol/L — ABNORMAL HIGH (ref 98–111)
Creatinine, Ser: 1.65 mg/dL — ABNORMAL HIGH (ref 0.61–1.24)
GFR, Estimated: 44 mL/min — ABNORMAL LOW (ref >60)
Glucose, Bld: 162 mg/dL — ABNORMAL HIGH (ref 70–99)
Potassium: 3.8 mmol/L (ref 3.5–5.1)
Sodium: 143 mmol/L (ref 135–145)

## 2023-10-01 NOTE — Progress Notes (Signed)
PROGRESS NOTE   Subjective/Complaints:  Labs reviewed good response to IVF No c/o , has had R shoulder pain since CVA   ROS: as per HPI. Denies CP, SOB, abd pain, N/V/D/C, or any other complaints at this time.    Objective:   No results found. No results for input(s): "WBC", "HGB", "HCT", "PLT" in the last 72 hours.   Recent Labs    09/29/23 0501 10/01/23 0627  NA 146* 143  K 4.2 3.8  CL 114* 113*  CO2 25 24  GLUCOSE 189* 162*  BUN 35* 29*  CREATININE 1.76* 1.65*  CALCIUM 8.9 9.0     Intake/Output Summary (Last 24 hours) at 10/01/2023 0734 Last data filed at 10/01/2023 0413 Gross per 24 hour  Intake 732.14 ml  Output 200 ml  Net 532.14 ml         Physical Exam: Vital Signs Blood pressure 130/61, pulse 69, temperature 98.1 F (36.7 C), resp. rate 18, height 5\' 11"  (1.803 m), weight 71.9 kg, SpO2 93%.  General: No acute distress Mood and affect are appropriate Heart: Regular rate and rhythm no rubs murmurs or extra sounds Lungs: Clear to auscultation, breathing unlabored, no rales or wheezes Abdomen: Positive bowel sounds, soft nontender to palpation, nondistended Extremities: No clubbing, cyanosis, Skin: No evidence of breakdown, no evidence of rash Mild dorsal hand edema  No edema in LE Neuro:  pt is alert and oriented. Follows basic commands. Fair insight and awareness. Speech with ongoing  dysarthria. Right central 7 remains. Antigravity and against resistance in left upper and lower extremities; flaccid right upper and lower extremity Flaccid tone -  RUE 0/5, RLE --trace HF/E and 0/5 KE/ADF/APF. Decreased light touch in  RUE.  MSK- no pain with R finger wrist or elbow ROM , 1.5FB sublux R shoulder , pain with PROM right shoulder esp with ext rotation  Assessment/Plan: 1. Functional deficits which require 3+ hours per day of interdisciplinary therapy in a comprehensive inpatient rehab  setting. Physiatrist is providing close team supervision and 24 hour management of active medical problems listed below. Physiatrist and rehab team continue to assess barriers to discharge/monitor patient progress toward functional and medical goals  Care Tool:  Bathing    Body parts bathed by patient: Right arm, Chest, Abdomen, Front perineal area, Right upper leg, Left upper leg, Face   Body parts bathed by helper: Right lower leg, Left lower leg, Buttocks, Front perineal area, Left arm     Bathing assist Assist Level: Maximal Assistance - Patient 24 - 49%     Upper Body Dressing/Undressing Upper body dressing   What is the patient wearing?: Pull over shirt    Upper body assist Assist Level: Moderate Assistance - Patient 50 - 74%    Lower Body Dressing/Undressing Lower body dressing      What is the patient wearing?: Pants     Lower body assist Assist for lower body dressing: Maximal Assistance - Patient 25 - 49%     Toileting Toileting    Toileting assist Assist for toileting: Total Assistance - Patient < 25%     Transfers Chair/bed transfer  Transfers assist     Chair/bed transfer  assist level: Maximal Assistance - Patient 25 - 49% (squat pivot)     Locomotion Ambulation   Ambulation assist   Ambulation activity did not occur: Safety/medical concerns  Assist level: Maximal Assistance - Patient 25 - 49% Assistive device: Other (comment) (hallway HR) Max distance: 30 ft   Walk 10 feet activity   Assist  Walk 10 feet activity did not occur: Safety/medical concerns  Assist level: Maximal Assistance - Patient 25 - 49% Assistive device: Other (comment) (hallway HR)   Walk 50 feet activity   Assist Walk 50 feet with 2 turns activity did not occur: Safety/medical concerns         Walk 150 feet activity   Assist Walk 150 feet activity did not occur: Safety/medical concerns         Walk 10 feet on uneven surface  activity   Assist  Walk 10 feet on uneven surfaces activity did not occur: Safety/medical concerns         Wheelchair     Assist Is the patient using a wheelchair?: Yes Type of Wheelchair: Manual Wheelchair activity did not occur: Safety/medical concerns  Wheelchair assist level: Moderate Assistance - Patient 50 - 74% Max wheelchair distance: 50 ft    Wheelchair 50 feet with 2 turns activity    Assist        Assist Level: Moderate Assistance - Patient 50 - 74%   Wheelchair 150 feet activity     Assist      Assist Level: Dependent - Patient 0%   Blood pressure 130/61, pulse 69, temperature 98.1 F (36.7 C), resp. rate 18, height 5\' 11"  (1.803 m), weight 71.9 kg, SpO2 93%.  Medical Problem List and Plan: 1. Functional deficits secondary to left basal ganglia ICH d/t hypertension/eliquis on 09/05/2023             -patient may shower             -ELOS/Goals: 10/07/23 goals min assist with PT, OT, SLP             - resting WHO and PRAFO    -Continue CIR therapies including PT, OT, and SLP      Rec SNF, requires 2 caregivers, no sig progress expected in short term , 2nd floor walk up inaccessible, SW to address  2.  Antithrombotics: -DVT/anticoagulation:Eliquis ( for A fib)  3. Pain Management: Tylenol prn. Voltaren gel QID.              -continue low dose gabapentin 100mg  at bedtime--dysesthetic pain improved Hemiplegic shoulder pain , may benefit from Zynex Estim device Cont scheduled acetaminophen  May need Right arm sling for transfers  4. Mood/Behavior/Sleep: LCSW to follow for evaluation and support.              -antipsychotic agents: N/A 5. Neuropsych/cognition: This patient is capable of making decisions on his own behalf. 6. Skin/Wound Care: Routine pressure relief measures. 7. Fluids/Electrolytes/Nutrition:               --added low salt restrictions. Add protein supplement    Intake of meals averaging ~25-50% Recorded intake of fluids variable 134mL-600mL        Latest Ref Rng & Units 10/01/2023    6:27 AM 09/29/2023    5:01 AM 09/28/2023    4:48 AM  BMP  Glucose 70 - 99 mg/dL 010  272  536   BUN 8 - 23 mg/dL 29  35  34   Creatinine 0.61 -  1.24 mg/dL 0.98  1.19  1.47   Sodium 135 - 145 mmol/L 143  146  150   Potassium 3.5 - 5.1 mmol/L 3.8  4.2  4.1   Chloride 98 - 111 mmol/L 113  114  118   CO2 22 - 32 mmol/L 24  25  25    Calcium 8.9 - 10.3 mg/dL 9.0  8.9  9.3      8. T2DM insulin dependent: Hgb A1c-6.2 on 09/06/23             -diet controlled -CBG's not being checked   9. PAD s/o aortobifemoral bypass w/right pseudoaneurysm: SBP goal < 140.  --Plans for repair early December by Dr. Sherral Hammers? 10. COPD w/chronic bronchitis/emphysema: currently on 6 L oxygen. Titrate to keep sats > 90% per Dr. Vassie Loll.              --on Trelegy PTA --started on Breo/Incruse today.              --pulmonary toilet. Weaning down to baseline as able.  11. CAF: Monitor HR TID. On coreg 6.25mg  BID. Off eliquis due to ICH- per Neuro may resume on 11/25 if stable --Neurology recommends resuming  Eliquis in 2 weeks if neuro and BP stable.    -10/01/23 HR well controlled  Vitals:   10/01/23 0417 10/01/23 0420  BP:  130/61  Pulse:  69  Resp: 13 18  Temp: 98.1 F (36.7 C) 98.1 F (36.7 C)  SpO2: 93% 93%   Sys BP mildly elevated due to IVF  12. Dilated cardiomyopathy/AS/chronic systolic CHF: Low salt diet. Check weight daily--up from 166-->173? --CXR 11/11 w/vascular congestion-->will order follow up CXR to check for fluid overload as cause of hypoxia.  --Monitor for signs of overload. On Lasix 20mg  daily, coreg 6.25mg  BID, Imdur 30mg  daily, hydralazine 100mg  TID -LVEF 55-60% -Weights stable , will d/c lasix due to elevated BUN/Creat   Filed Weights   09/29/23 0646 09/30/23 0538 10/01/23 0414  Weight: 73 kg 73.8 kg 71.9 kg    13. CKD: stage IIIb? -11/17 BUN/Cr stable at 33/1.82, 11/19 baseline creat likely in 1.6-1.8 range based on labs in system  Improving ,  fluid intake recorded at 11/18 but only 236 mL on 11/19, Creat up, hold lasix starting 11/21  11/22- no labs today, +350cc yesterday--continue to hold lasix 11-23: BUN and creatinine remain elevated, unchanged from prior.  Output not recorded from last 2 days.  No signs of volume overload, continue to hold Lasix.  Encourage p.o. fluids and recheck Monday.    Latest Ref Rng & Units 10/01/2023    6:27 AM 09/29/2023    5:01 AM 09/28/2023    4:48 AM  BMP  Glucose 70 - 99 mg/dL 829  562  130   BUN 8 - 23 mg/dL 29  35  34   Creatinine 0.61 - 1.24 mg/dL 8.65  7.84  6.96   Sodium 135 - 145 mmol/L 143  146  150   Potassium 3.5 - 5.1 mmol/L 3.8  4.2  4.1   Chloride 98 - 111 mmol/L 113  114  118   CO2 22 - 32 mmol/L 24  25  25    Calcium 8.9 - 10.3 mg/dL 9.0  8.9  9.3   Improving , d/c IVF after current bag finished    14. Dysphagia: On D1, nectar- likely contributing to poor intake of liquids               -encourage  fluids, aspiration precautions   -see above  15. HTN: on amlodipine 10mg  daily, coreg 6.25mg  BID, clonidine 0.2mg  q8h, hydralazine 100mg  TID  BP reasonable off furosemide   12/4 controlled  Vitals:   09/28/23 0539 09/28/23 1307 09/28/23 1957 09/28/23 2042  BP: (!) 149/64 115/66 (!) 130/59 (!) 130/59   09/29/23 0510 09/29/23 0514 09/29/23 1417 09/29/23 2023  BP: (!) 154/64 (!) 154/64 (!) 147/60 (!) 156/68   09/30/23 0538 09/30/23 1311 09/30/23 1955 10/01/23 0420  BP: (!) 182/78 (!) 130/56 135/71 130/61  Some lability but overall improving  16. Hx of seizure: continue keppra 500mg  BID- f/u neuro as OP   17. HLD: continue rosuvastatin 10mg  QHS  18.  Right hip pain no sig OA on Xray  19. Constipation:  -09/26/23 no BM documented since 09/22/23; will increase senokot S to 2 tabs BID since miralax might be difficult to achieve nectar thick; no BM x 3 d     LOS: 21 days A FACE TO FACE EVALUATION WAS PERFORMED  Erick Colace 10/01/2023, 7:34 AM

## 2023-10-01 NOTE — Progress Notes (Signed)
Speech Language Pathology Daily Session Note  Patient Details  Name: Kyle [name] MRN: [id number] Date of Birth: 04-17-1951  Today's Date: 10/01/2023 SLP Individual Time: 6433-2951 SLP Individual Time Calculation (min): 42 min  Short Term Goals: Week 4: SLP Short Term Goal 1 (Week 4): STG=LTG due to ELOS  Skilled Therapeutic Interventions:  Skilled therapy session focused on dysphagia, cognitive and communication goals. SLP faciliated session by providing oral care and cleaning dentures. SLP offered trials of D1/NTL. Patient with reduced anterior spillage and no s/sx of aspiration throughout, however with increased diffuse oral reisduals this date likely due to consumption of dairy products. Patient utilized swallowing strategies and completed chin tuck against resistance exercises given supervision A. Continue current diet and full supervision. SLP addressed communication goals through review of speech intelligibility strategies. Patient and wife report practicing strategies outside of sessions. Patient approximately 80% intelligible at the word level given supervision-minA during minimal pairs. Lastly, SLP addressed cognition through temporal orientation task. Patient utilized external aid to name date, day of week, month, year and discharge date with supervision A. Patient left in TIS [facility] with alarm set and call bell in reach. Continue POC    Pain None reported   Therapy/Group: Individual Therapy  Scarlett Portlock M.A., CF-SLP 10/01/2023, 7:50 AM

## 2023-10-01 NOTE — Progress Notes (Signed)
Patient ID: LESEAN KENIMER, male   DOB: 05-24-1951, 72 y.o.   MRN: [id number]  Sw met with patient, spouse, daughter and son in law in the room to discuss discharge plan. Daughter and son in law cancelling family education for tomorrow and will jump in planned sessions for today. Daughter and son in law live two hours away and only plan to assist as needed or when they can. Spouse will continue to serve as primarily caregiver to patient.  SW discussed SNF and provided SNF resources. Sw has provided the family with a list of approved and denied facilities. Family plans to tour facilities over the weekend. Family will follow up with SW on Monday with their 3-5 preferences for SNF.  Once Sw has been provided with the preferences SW will submit authorization.  Sw has expressed to the family the decision is 100% up to insurance and there is a possibility for approval or denial. SW encouraged daughter and son in law to attend family education or jump in arranged sessions for today. Daughter questing the point of education, SW educated daughter. Daughter expressed that she does not understand the need for her to attend education when pt's spouse is the primary caregiver. SW educated daughter for safety when caring for patient at d/c. SW informed daughter pt and spouse will require additional physical assistance if pt is required to d/c home. Daughter and son in law plan on attending already arranged sessions for today.  SW educated the family that the team will have to continue with discharge home if insurance does not approve. Pt and family aware if approved they are approved for a short term rehab stay of 21 days or less. The plans is d/c home from SNF.    Spouse and son in law inquiring about an extension. Sw informed pt, spouse, daughter and DIL that the team can justify an extension if unable to justify need. Sw will have follow up on Wednesday in conference. Currently the plans is to d/c to SNF next week or  d/c home on 12/12. No additional questions or concerns.

## 2023-10-01 NOTE — Progress Notes (Signed)
Physical Therapy Session Note  Patient Details  Name: Kyle [name] MRN: [id number] Date of Birth: Mar 02, 1951  Today's Date: 10/01/2023 PT Individual Time: 6440-3474; 1308 - 1406  PT Individual Time Calculation (min): 28 min; 58 min   Short Term Goals: Week 3:  PT Short Term Goal 1 (Week 3): Pt will roll side to side w/ mod A. PT Short Term Goal 2 (Week 3): Pt will perform supine<>to sit w/ mod A. PT Short Term Goal 3 (Week 3): Pt will perform squat pivot transfer w/ mod A. PT Short Term Goal 4 (Week 3): Pt will ambulate 30 ft using LRAD with maxA. PT Short Term Goal 5 (Week 3): Pt will propel w/c using hemitechnique and MinA  SESSION 1 Skilled Therapeutic Interventions/Progress Updates: Patient supine in bed with wife present on entrance to room. Patient alert and agreeable to PT session.   Patient reported no pain at beginning of PT session. Beginning and end of session spent on encouraging and educating pt on potential d/c to SNF. Pt consoled in hesitation, and further informed that it will not only provide pt with more hands to assist with daily living and functional increase in mobility, but also decrease in caregiver burden (wife) as wife would not be able to safely and effectively provide the care that pt requires in current presentation. Pt saddened, and wants to go home, but seemingly starting to understand the benefits outweigh the placement.   Therapeutic Activity: Bed Mobility: Pt performed bridge in bed to donn personal pants (PTA threaded through B LE's up to knees). PTA approximated/stabilized B LE's while pt cued to bridge hips to donn up to waist with rehab tech providing minA. Pt then supine<sit on EOB with maxA (increase assistance to transition due to room change and pt now having to roll to paretic side, decreasing use of L UE to assist in truncal elevation. VC required for hand placement and to hook L LE behind R LE to advance off of bed. Pt sitting EOB with minA to avoid  falling over (some moments of close supervision) while PTA and tech donned personal shoes and AFO.  Transfers: Pt performed lateral scoot to the L from EOB to TIS with modA + 2 for safety. Pt required VC for sequence and hand placement (head/hip relationship). Pt informed that due to assistance required, sliding board is easier to use to the L - minA - pt understood, he just wanted to perform lateral scoot. Pt required cues to initiate movement for scoot.   Patient left in TIS at end of session with brakes locked, wife present, belt alarm set, and all needs within reach.  SESSION 2 Skilled Therapeutic Interventions/Progress Updates: Patient supine in bed with family present (following conversation with attending LCSW) on entrance to room. Patient alert and agreeable to PT session.   Patient reported no pain and eager to show family his progression. [employer] [facility] rep present as well for AFO evaluation. Pt wife and son-in-law/daughter were present, which added to pt's increases in morale and motivation, which was greatly appreciated.   Therapeutic Activity: Bed Mobility: Pt performed supine<>sit on EOB with maxA (to the paretic side - R). VC required for flexion of L knee to bridge hips over with L UE use of HOB railing as well as use of L LE to hook R LE off of bed. Pt with max cues to maintain sequence (pt would cease bridging hips to pull self over vs using both simultaneously). Pt then with CGA  to maintain sitting balance while [employer] rep donned B shoes and R AFO. Transfers: Pt performed sit<>stand pivot transfer from EOB<>TIS at beginning and end of session with maxA. Provided VC for advancement of B LE's accordingly with lateral weight shifting. Pt performed sit to stand from EOB with maxA and cued to weight shift laterally at beginning of session for [employer] Rep to assess R LE control. Pt noted ability to extend R hip to retro-step back to TIS but unable to coordinate (max/totalA to avoid buckling on  R knee when having to advance L LE). Pt transported from room<>main gym hallway in TIS dependently for time (also managing O2 tank).   Gait Training:  Pt ambulated x 5' (for [employer] [facility] rep to evaluate AFO needs further - pt shoe taken at that point to make required adjustments) using LHR with maxA. Pt then ambulated 35' x 1 with R DF wrap donned and with grip socks donned. PTA on R side providing anterior hip advancement and buckle prevention (maxA) and swing phase facilitation with pt noting to advance with mod/maxA required (maxA when anterior hip translation not provided, and modA with it). Pt with cues to weight shift laterally accordingly during gait cycle, as well as to step through with L LE past R. Pt also required mod cues to stand upright with controlled posture. Pt required max cues to reach back to surfaces to control descent and continues to demonstration lack of self initiation to self assist in function with increased cues and time. Pt's son followed in [facility] with O2 tank management (4L).   Patient supine in bed at end of session with brakes locked, family present, bed alarm set, and all needs within reach.       Therapy Documentation Precautions:  Precautions Precautions: Fall Precaution Comments: R hemiplegia, slight "pusher", SBP <140 Restrictions Weight Bearing Restrictions: No  Therapy/Group: Individual Therapy  Jonuel Butterfield PTA 10/01/2023, 12:40 PM

## 2023-10-01 NOTE — Progress Notes (Signed)
Occupational Therapy Session Note  Patient Details  Name: Kyle [name] MRN: [id number] Date of Birth: [birth year]  Today's Date: 10/01/2023 OT Individual Time: 4098-1191 OT Individual Time Calculation (min): 82 min    Short Term Goals: Week 1:  OT Short Term Goal 1 (Week 1): The pt will complete UB bathing and dressing with MinA consistently at 95% safe OT Short Term Goal 1 - Progress (Week 1): Progressing toward goal OT Short Term Goal 2 (Week 1): The pt will complete LB bathing a dressing using AE at Mod A at 95% safe OT Short Term Goal 2 - Progress (Week 1): Progressing toward goal OT Short Term Goal 3 (Week 1): The pt will transfer to all surfaces with Mod A using the sliding board at 95% safe OT Short Term Goal 3 - Progress (Week 1): Progressing toward goal OT Short Term Goal 4 (Week 1): The pt will maintain good sit balance  consistently at 95% safe OT Short Term Goal 4 - Progress (Week 1): Progressing toward goal OT Short Term Goal 5 (Week 1): The pt will tolerate > 45 Minutes of therapy with minimal restbreaks at 95% safe. OT Short Term Goal 5 - Progress (Week 1): Progressing toward goal Week 2:  OT Short Term Goal 1 (Week 2): pt will demonstrate improved sitting balance to sit upright without back support consistently for at least 10 minutes at a time to enable him to engage in sefl care tasks at EOB. OT Short Term Goal 1 - Progress (Week 2): Progressing toward goal OT Short Term Goal 2 (Week 2): Pt will don shirt with mod A demonstrating improved attention and awareness of RUE. OT Short Term Goal 2 - Progress (Week 2): Progressing toward goal OT Short Term Goal 3 (Week 2): Pt will complete squat pivots w/c >< bed or mat to L and to R consistently with max A of 1 to progress to completing toilet transfers in a safe manner (pt currently requires a stedy lift) OT Short Term Goal 3 - Progress (Week 2): Progressing toward goal OT Short Term Goal 4 (Week 2): Pt will demonstrate  improved awareness of RUE by initiating reaching for R arm with L arm to prepare for bed mobility. OT Short Term Goal 4 - Progress (Week 2): Progressing toward goal Week 3:  OT Short Term Goal 1 (Week 3): STGs = LTGs  Skilled Therapeutic Interventions/Progress Updates:    Pt received in w/c squirming to get out as he was feeling extremely uncomfortable. [name] present. Pt on 4.5L of oxygen. With +2 A pt used sliding board with heavy mod A to slide from [facility] to his bed to his R side using L hand to help push him along the board.  Once pt at EOB worked on sitting balance with much improved trunk control today.  He was able to tolerate sitting EOB for over 30 minutes with NO falls posteriorly!  Pt worked on actively leaning forward reaching with L hand and then returning to neutral sitting.  Integrated use of exercise band around torso for sensory feedback.  Then pt worked on L lateral weight shift back to neutral with pt rolling a ball to his left and back.  Placed ball under R arm and worked on Chief Operating Officer. Trace activation of scapular retraction noted.    Sit to stands from EOB.  Place back of tall recliner on pt's left side. Pt pushed to stand with mod A with therapist support R leg and  rehab tech supporting upper torso. Pt used his L hand on top of recliner for support.  Pt able to tolerate standing for 2-3 minutes at a time engaging his upper back muscles for upright posture.  Cues and facilitation to soften L knee as he pushes through extended L knee and pushed himself to the R. Cues to shift L  hip to L to avoid R lean.  Pt able to repeat the stands 4 times.     Pt then moved to supine and set up in bed in chair position. Applied estim to shoulder and forearm to stimulate deltoid and finger extensors. Pt able to tolerate an intensity of 30 on both areas for 30 minutes.  Had pt work on visually attending to his hand and "trying" to open his fingers more. Integrated a hand over hand  grasp and release activity for 10 minutes.  Pt tolerated estim well.  10 sec on and off.   Pt resting in bed with all needs met and alarm on.    Therapy Documentation Precautions:  Precautions Precautions: Fall Precaution Comments: R hemiplegia, slight "pusher", SBP <140 Restrictions Weight Bearing Restrictions: No    Vital Signs: Therapy Vitals BP: 133/65 Pain: Pain Assessment Pain Score: 0-No pain     Therapy/Group: Individual Therapy  Nusaiba Guallpa 10/01/2023, 12:04 PM

## 2023-10-01 NOTE — Progress Notes (Signed)
Patient ID: Kyle [name], male   DOB: September 11, 1951, 72 y.o.   MRN: [id number]  SW met with spouse to discuss SNF. Spouse has expressed she is still discussing SNF with patient. Daughter will be present around 12  PM to meet with patient. SW will meet with patient, spouse and daughter around 12:30 PM to discuss SNF and provide SNF resources. Pt and daughter plan to tour facilities today and over the weekend. SW will start authorization on Monday 10/04/23 to allow pt and family to obtain SNF preferences. No additional questions or concerns.

## 2023-10-02 MED ORDER — BISACODYL 5 MG PO TBEC
5.0000 mg | DELAYED_RELEASE_TABLET | Freq: Every day | ORAL | Status: DC
  Administered 2023-10-02 – 2023-10-06 (×5): 5 mg via ORAL
  Filled 2023-10-02 (×5): qty 1

## 2023-10-02 NOTE — Progress Notes (Signed)
Physical Therapy Session Note  Patient Details  Name: Kyle [name] MRN: [id number] Date of Birth: 05-13-1951  Today's Date: 10/02/2023 PT Individual Time: 1503-1530 PT Individual Time Calculation (min): 27 min   Short Term Goals: Week 3:  PT Short Term Goal 1 (Week 3): Pt will roll side to side w/ mod A. PT Short Term Goal 2 (Week 3): Pt will perform supine<>to sit w/ mod A. PT Short Term Goal 3 (Week 3): Pt will perform squat pivot transfer w/ mod A. PT Short Term Goal 4 (Week 3): Pt will ambulate 30 ft using LRAD with maxA. PT Short Term Goal 5 (Week 3): Pt will propel w/c using hemitechnique and MinA  Skilled Therapeutic Interventions/Progress Updates: Pt presents supine in bed w/ visitors present but agreeable to therapy.  Pt transfers sup to sit w/ mod A and cueing for unilateral bridging to bring hips to R EOB.  Pt completed sit w/ mod A and use of siderail.  Pt scoots to EOB w/ A for LES and maintaining RUE.  Pt performed seated neuro re-ed for midline sitting, partial ab crunches, lowering to B elbow/FA w/ cues and close sup to mod A to maintain/regain balance.  Pt returned to supine w/ mod A and scoot to center of bed.  Handed off to nursing.     Therapy Documentation Precautions:  Precautions Precautions: Fall Precaution Comments: R hemiplegia, slight "pusher", SBP <140 Restrictions Weight Bearing Restrictions: No General:   Vital Signs: Therapy Vitals Temp: 98.2 F (36.8 C) Pulse Rate: 81 Resp: 17 BP: 134/73 Patient Position (if appropriate): Lying Oxygen Therapy SpO2: 94 % O2 Device: Nasal Cannula Pain:0/10      Therapy/Group: Individual Therapy  Lucio Edward 10/02/2023, 3:47 PM

## 2023-10-02 NOTE — Progress Notes (Signed)
Physical Therapy Session Note  Patient Details  Name: Kyle [name] MRN: [id number] Date of Birth: 10/15/1951  Today's Date: 10/02/2023 PT Individual Time: 0925-1008 PT Individual Time Calculation (min): 43 min   Short Term Goals: Week 3:  PT Short Term Goal 1 (Week 3): Pt will roll side to side w/ mod A. PT Short Term Goal 2 (Week 3): Pt will perform supine<>to sit w/ mod A. PT Short Term Goal 3 (Week 3): Pt will perform squat pivot transfer w/ mod A. PT Short Term Goal 4 (Week 3): Pt will ambulate 30 ft using LRAD with maxA. PT Short Term Goal 5 (Week 3): Pt will propel w/c using hemitechnique and MinA  Skilled Therapeutic Interventions/Progress Updates: Patient supine in bed on entrance to room. Patient alert and agreeable to PT session.   Patient reported no pain at beginning of PT session. Pt provided with ROHO cushion to fit to increase pt tolerance in sitting in TIS between therapy sessions (pt reported increased comfortability at end of session in TIS)  Therapeutic Activity: Bed Mobility: Pt bridged with PTA approximating and stabilizing B LE's at knees for pt to donn personal pants (PTA threaded pants up to knee). Pt donned up to waist with increased time/effort and VC to pull R side up with L UE). Pt performed supine<>sit on EOB with heavy mod/maxA (side roll to paretic side). VC required for maintained use of L LE to bridge hips to side lying as well as use of L UE to push vs grabbing onto PTA to assist in elevating trunk to sitting EOB. Pt sitting EOB with CGA + 2 while PTA donned personal shoes and new R GRAFO provided by [facility] rep. Transfers: Pt performed slide board transfer to the L with modA from EOB to TIS. Provided VC for initiation and use of B LE to power hips across boar (along with L UE pulling on far arm rest of TIS). Pt continues to require increased assistance to maintain stabile trunk control when transferring vs presenting with "flop-like" motion.   Gait  Training:  Pt ambulated roughly 15' in midwest hallway using LHR with max/heavy maxA. Pt required increased assistance to advance R LE compared to previous session (yesterday) with this PTA. Decrease in R LE musculature required for swing phase this time with pt attempting to advance, but unable to to do so without heavy maxA/totalA (including buckle prevention). Pt also continues to require max cues to maintain upright posture when ambulation vs softly throwing weight anteriorly (pt cued to lean posteriorly to increase hip compensation to advance with no improvement throughout trial). Rehab tech followed in TIS while also managing O2 tank (4L). Pt discouraged due to having improvements just the previous day when family was here vs this day.   Manual Therapy: - Pt with noted knot in R upper trap musculature. Pt willing to have PTA perform trigger point release to increase future comfort. Pt with no redness of skin or further irritation at end. Pt provided with heat pack at end of session for comfort. Pt also reported that R UE felt better than previously (no decrease in pain, only increase in comfort after manual therapy).  Patient in TIS at end of session with brakes locked, belt alarm set, and all needs within reach. Pt encouraged to sit in TIS to assess comfort of Roho cushion as long as possible, and to call nsg for assistance back to bed if necessary.       Therapy Documentation Precautions:  Precautions Precautions: Fall Precaution Comments: R hemiplegia, slight "pusher", SBP <140 Restrictions Weight Bearing Restrictions: No  Therapy/Group: Individual Therapy  Aviance Cooperwood PTA 10/02/2023, 12:45 PM

## 2023-10-02 NOTE — Progress Notes (Signed)
Speech Language Pathology Daily Session Note  Patient Details  Name: Kyle [name] MRN: [id number] Date of Birth: 1951/02/01  Today's Date: 10/02/2023 SLP Individual Time: 1130-1200 SLP Individual Time Calculation (min): 30 min  Short Term Goals: Week 4: SLP Short Term Goal 1 (Week 4): STG=LTG due to ELOS  Skilled Therapeutic Interventions: Skilled therapy session focused on dysphagia goals. SLP facilitated session by providing oral care and offering trials of D1/NTL consistencies. Patient required supervision A to utilize swallowing compensatory strategies including effortful swallow. No s/sx of aspiration noted throughout and no anterior spillage this date. Patient continues to present with diffuse oral residuals, specifically on the posterior palate, however no significant pocketing. Recommend continuation of current diet with full supervision and plan to trial D2 solids next session. SLP continued to address dysphagia through providing supervision A during completion of pharyngeal strengthening exercises (chin tuck against resistance) x10. Patient left in chair with alarm set and call bell in reach. Kyle [name] present. Continue POC.  Pain None reported   Therapy/Group: Individual Therapy  Kyle [name] M.A., CF-SLP 10/02/2023, 7:43 AM

## 2023-10-02 NOTE — Plan of Care (Signed)
  Problem: Consults Goal: RH STROKE PATIENT EDUCATION Description: See Patient Education module for education specifics  Outcome: Progressing   Problem: RH BOWEL ELIMINATION Goal: RH STG MANAGE BOWEL WITH ASSISTANCE Description: STG Manage Bowel with toileting Assistance. Outcome: Progressing Goal: RH STG MANAGE BOWEL W/MEDICATION W/ASSISTANCE Description: STG Manage Bowel with Medication with mod I Assistance. Outcome: Progressing   Problem: RH SAFETY Goal: RH STG ADHERE TO SAFETY PRECAUTIONS W/ASSISTANCE/DEVICE Description: STG Adhere to Safety Precautions With cues Assistance/Device. Outcome: Progressing   Problem: RH KNOWLEDGE DEFICIT Goal: RH STG INCREASE KNOWLEDGE OF DIABETES Description: Patient and wife will be able to manage prediabetes with dietary modification using educational resources independently Outcome: Progressing Goal: RH STG INCREASE KNOWLEDGE OF HYPERTENSION Description: Patient and wife will be able to manage HTN with medications and dietary modification using educational resources independently Outcome: Progressing Goal: RH STG INCREASE KNOWLEDGE OF DYSPHAGIA/FLUID INTAKE Description: Patient and wife will be able to manage dysphagia, medications and dietary modification using educational resources independently Outcome: Progressing Goal: RH STG INCREASE KNOWLEGDE OF HYPERLIPIDEMIA Description: Patient and wife will be able to manage HLD with medications and dietary modification using educational resources independently Outcome: Progressing   Problem: Education: Goal: Ability to describe self-care measures that may prevent or decrease complications (Diabetes Survival Skills Education) will improve Outcome: Progressing Goal: Individualized Educational Video(s) Outcome: Progressing   Problem: Coping: Goal: Ability to adjust to condition or change in health will improve Outcome: Progressing   Problem: Fluid Volume: Goal: Ability to maintain a balanced  intake and output will improve Outcome: Progressing   Problem: Health Behavior/Discharge Planning: Goal: Ability to identify and utilize available resources and services will improve Outcome: Progressing Goal: Ability to manage health-related needs will improve Outcome: Progressing   Problem: Metabolic: Goal: Ability to maintain appropriate glucose levels will improve Outcome: Progressing   Problem: Nutritional: Goal: Maintenance of adequate nutrition will improve Outcome: Progressing Goal: Progress toward achieving an optimal weight will improve Outcome: Progressing

## 2023-10-02 NOTE — Progress Notes (Signed)
PROGRESS NOTE   Subjective/Complaints: Feeling well No new complaints this morning Denies pain Tolerated therapy well today  ROS: as per HPI. Denies CP, SOB, abd pain, N/V/D/C, or any other complaints at this time.    Objective:   No results found. No results for input(s): "WBC", "HGB", "HCT", "PLT" in the last 72 hours.   Recent Labs    10/01/23 0627  NA 143  K 3.8  CL 113*  CO2 24  GLUCOSE 162*  BUN 29*  CREATININE 1.65*  CALCIUM 9.0     Intake/Output Summary (Last 24 hours) at 10/02/2023 1552 Last data filed at 10/02/2023 1145 Gross per 24 hour  Intake 354 ml  Output 550 ml  Net -196 ml         Physical Exam: Vital Signs Blood pressure 134/73, pulse 81, temperature 98.2 F (36.8 C), resp. rate 17, height 5\' 11"  (1.803 m), weight 68.3 kg, SpO2 94%.  General: No acute distress Mood and affect are appropriate Heart: Regular rate and rhythm no rubs murmurs or extra sounds Lungs: Clear to auscultation, breathing unlabored, no rales or wheezes Abdomen: Positive bowel sounds, soft nontender to palpation, nondistended Extremities: No clubbing, cyanosis, Skin: No evidence of breakdown, no evidence of rash Mild dorsal hand edema  No edema in LE Neuro:  pt is alert and oriented. Follows basic commands. Fair insight and awareness. Speech with ongoing  dysarthria. Right central 7 remains. Antigravity and against resistance in left upper and lower extremities; flaccid right upper and lower extremity Flaccid tone -  RUE 0/5, RLE --trace HF/E and 0/5 KE/ADF/APF. Decreased light touch in  RUE.  MSK- no pain with R finger wrist or elbow ROM , 1.5FB sublux R shoulder , pain with PROM right shoulder esp with ext rotation, stable 12/7  Assessment/Plan: 1. Functional deficits which require 3+ hours per day of interdisciplinary therapy in a comprehensive inpatient rehab setting. Physiatrist is providing close team  supervision and 24 hour management of active medical problems listed below. Physiatrist and rehab team continue to assess barriers to discharge/monitor patient progress toward functional and medical goals  Care Tool:  Bathing    Body parts bathed by patient: Right arm, Chest, Abdomen, Front perineal area, Right upper leg, Left upper leg, Face   Body parts bathed by helper: Right lower leg, Left lower leg, Buttocks, Front perineal area, Left arm     Bathing assist Assist Level: Maximal Assistance - Patient 24 - 49%     Upper Body Dressing/Undressing Upper body dressing   What is the patient wearing?: Pull over shirt    Upper body assist Assist Level: Moderate Assistance - Patient 50 - 74%    Lower Body Dressing/Undressing Lower body dressing      What is the patient wearing?: Pants     Lower body assist Assist for lower body dressing: Maximal Assistance - Patient 25 - 49%     Toileting Toileting    Toileting assist Assist for toileting: Total Assistance - Patient < 25%     Transfers Chair/bed transfer  Transfers assist     Chair/bed transfer assist level: Maximal Assistance - Patient 25 - 49% (squat pivot)  Locomotion Ambulation   Ambulation assist   Ambulation activity did not occur: Safety/medical concerns  Assist level: Maximal Assistance - Patient 25 - 49% Assistive device: Other (comment) (hallway HR) Max distance: 30 ft   Walk 10 feet activity   Assist  Walk 10 feet activity did not occur: Safety/medical concerns  Assist level: Maximal Assistance - Patient 25 - 49% Assistive device: Other (comment) (hallway HR)   Walk 50 feet activity   Assist Walk 50 feet with 2 turns activity did not occur: Safety/medical concerns         Walk 150 feet activity   Assist Walk 150 feet activity did not occur: Safety/medical concerns         Walk 10 feet on uneven surface  activity   Assist Walk 10 feet on uneven surfaces activity did not  occur: Safety/medical concerns         Wheelchair     Assist Is the patient using a wheelchair?: Yes Type of Wheelchair: Manual Wheelchair activity did not occur: Safety/medical concerns  Wheelchair assist level: Moderate Assistance - Patient 50 - 74% Max wheelchair distance: 50 ft    Wheelchair 50 feet with 2 turns activity    Assist        Assist Level: Moderate Assistance - Patient 50 - 74%   Wheelchair 150 feet activity     Assist      Assist Level: Dependent - Patient 0%   Blood pressure 134/73, pulse 81, temperature 98.2 F (36.8 C), resp. rate 17, height 5\' 11"  (1.803 m), weight 68.3 kg, SpO2 94%.  Medical Problem List and Plan: 1. Functional deficits secondary to left basal ganglia ICH d/t hypertension/eliquis on 09/05/2023             -patient may shower             -ELOS/Goals: 10/07/23 goals min assist with PT, OT, SLP             - resting WHO and PRAFO    -Continue CIR therapies including PT, OT, and SLP      Rec SNF, requires 2 caregivers, no sig progress expected in short term , 2nd floor walk up inaccessible, SW to address  2.  Antithrombotics: -DVT/anticoagulation:Eliquis ( for A fib)  3. Pain Management: Tylenol prn. Voltaren gel QID.              -continue low dose gabapentin 100mg  at bedtime--dysesthetic pain improved Hemiplegic shoulder pain , may benefit from Zynex Estim device Cont scheduled acetaminophen  May need Right arm sling for transfers  4. Mood/Behavior/Sleep: LCSW to follow for evaluation and support.              -antipsychotic agents: N/A 5. Neuropsych/cognition: This patient is capable of making decisions on his own behalf. 6. Skin/Wound Care: Routine pressure relief measures. 7. Fluids/Electrolytes/Nutrition:               --added low salt restrictions. Add protein supplement    Intake of meals averaging ~25-50% Recorded intake of fluids variable 111mL-600mL       Latest Ref Rng & Units 10/01/2023    6:27 AM  09/29/2023    5:01 AM 09/28/2023    4:48 AM  BMP  Glucose 70 - 99 mg/dL 865  784  696   BUN 8 - 23 mg/dL 29  35  34   Creatinine 0.61 - 1.24 mg/dL 2.95  2.84  1.32   Sodium 135 - 145 mmol/L 143  146  150   Potassium 3.5 - 5.1 mmol/L 3.8  4.2  4.1   Chloride 98 - 111 mmol/L 113  114  118   CO2 22 - 32 mmol/L 24  25  25    Calcium 8.9 - 10.3 mg/dL 9.0  8.9  9.3      8. T2DM insulin dependent: Hgb A1c-6.2 on 09/06/23             -diet controlled -CBG's not being checked   9. PAD s/o aortobifemoral bypass w/right pseudoaneurysm: SBP goal < 140.  --Plans for repair early December by Dr. Sherral Hammers? 10. COPD w/chronic bronchitis/emphysema: currently on 6 L oxygen. Titrate to keep sats > 90% per Dr. Vassie Loll.              --on Trelegy PTA --started on Breo/Incruse today.              --pulmonary toilet. Weaning down to baseline as able.  11. CAF: Monitor HR TID. On coreg 6.25mg  BID. Off eliquis due to ICH- per Neuro may resume on 11/25 if stable --Neurology recommends resuming  Eliquis in 2 weeks if neuro and BP stable.    -10/01/23 HR well controlled  Vitals:   10/02/23 0752 10/02/23 1352  BP:  134/73  Pulse:  81  Resp:  17  Temp:  98.2 F (36.8 C)  SpO2: 98% 94%   Sys BP mildly elevated due to IVF  12. Dilated cardiomyopathy/AS/chronic systolic CHF: Low salt diet. Check weight daily--up from 166-->173? --CXR 11/11 w/vascular congestion-->will order follow up CXR to check for fluid overload as cause of hypoxia.  --Monitor for signs of overload. On Lasix 20mg  daily, coreg 6.25mg  BID, Imdur 30mg  daily, hydralazine 100mg  TID -LVEF 55-60% -Weights stable , will d/c lasix due to elevated BUN/Creat   Filed Weights   09/30/23 0538 10/01/23 0414 10/02/23 0522  Weight: 73.8 kg 71.9 kg 68.3 kg    13. CKD: stage IIIb? -11/17 BUN/Cr stable at 33/1.82, 11/19 baseline creat likely in 1.6-1.8 range based on labs in system  Improving , fluid intake recorded at 11/18 but only 236 mL on 11/19,  Creat up, hold lasix starting 11/21  11/22- no labs today, +350cc yesterday--continue to hold lasix 11-23: BUN and creatinine remain elevated, unchanged from prior.  Output not recorded from last 2 days.  No signs of volume overload, continue to hold Lasix.  Encourage p.o. fluids and recheck Monday.    Latest Ref Rng & Units 10/01/2023    6:27 AM 09/29/2023    5:01 AM 09/28/2023    4:48 AM  BMP  Glucose 70 - 99 mg/dL 409  811  914   BUN 8 - 23 mg/dL 29  35  34   Creatinine 0.61 - 1.24 mg/dL 7.82  9.56  2.13   Sodium 135 - 145 mmol/L 143  146  150   Potassium 3.5 - 5.1 mmol/L 3.8  4.2  4.1   Chloride 98 - 111 mmol/L 113  114  118   CO2 22 - 32 mmol/L 24  25  25    Calcium 8.9 - 10.3 mg/dL 9.0  8.9  9.3   Improving , d/c IVF after current bag finished    14. Dysphagia: On D1, nectar- likely contributing to poor intake of liquids               -encourage fluids, aspiration precautions   -see above  15. HTN: continue amlodipine 10mg  daily, coreg 6.25mg  BID, clonidine 0.2mg   q8h, hydralazine 100mg  TID  BP reasonable off furosemide    Vitals:   09/30/23 0538 09/30/23 1311 09/30/23 1955 10/01/23 0420  BP: (!) 182/78 (!) 130/56 135/71 130/61   10/01/23 0812 10/01/23 1437 10/01/23 1440 10/01/23 1941  BP: 133/65 (!) 153/71 (!) 153/71 (!) 144/68   10/01/23 2131 10/02/23 0436 10/02/23 0623 10/02/23 1352  BP: 138/63 (!) 154/71 (!) 168/72 134/73  Some lability but overall improving  16. Hx of seizure: continue keppra 500mg  BID- f/u neuro as OP   17. HLD: continue rosuvastatin 10mg  QHS  18.  Right hip pain no sig OA on Xray  19. Constipation:  Continue senokot S to 2 tabs BID since miralax might be difficult to achieve nectar thick; add dulcolax daily     LOS: 22 days A FACE TO FACE EVALUATION WAS PERFORMED  Kyle [name] 10/02/2023, 3:52 PM

## 2023-10-03 NOTE — Progress Notes (Signed)
PROGRESS NOTE   Subjective/Complaints: No new complaints this morning Patient's chart reviewed- No issues reported overnight Vitals signs stable   ROS: as per HPI. Denies CP, SOB, abd pain, N/V/D/C, or any other complaints at this time.    Objective:   No results found. No results for input(s): "WBC", "HGB", "HCT", "PLT" in the last 72 hours.   Recent Labs    10/01/23 0627  NA 143  K 3.8  CL 113*  CO2 24  GLUCOSE 162*  BUN 29*  CREATININE 1.65*  CALCIUM 9.0     Intake/Output Summary (Last 24 hours) at 10/03/2023 1137 Last data filed at 10/03/2023 0800 Gross per 24 hour  Intake 100 ml  Output 350 ml  Net -250 ml         Physical Exam: Vital Signs Blood pressure 133/70, pulse 86, temperature 98.5 F (36.9 C), temperature source Oral, resp. rate 18, height 5\' 11"  (1.803 m), weight 69 kg, SpO2 98%.  General: No acute distress Mood and affect are appropriate Heart: Regular rate and rhythm no rubs murmurs or extra sounds Lungs: Clear to auscultation, breathing unlabored, no rales or wheezes Abdomen: Positive bowel sounds, soft nontender to palpation, nondistended Extremities: No clubbing, cyanosis, Skin: No evidence of breakdown, no evidence of rash Mild dorsal hand edema  No edema in LE Neuro:  pt is alert and oriented. Follows basic commands. Fair insight and awareness. Speech with ongoing  dysarthria. Right central 7 remains. Antigravity and against resistance in left upper and lower extremities; flaccid right upper and lower extremity Flaccid tone -  RUE 0/5, RLE --trace HF/E and 0/5 KE/ADF/APF. Decreased light touch in  RUE.  MSK- no pain with R finger wrist or elbow ROM , 1.5FB sublux R shoulder , pain with PROM right shoulder esp with ext rotation, stable 12/8  Assessment/Plan: 1. Functional deficits which require 3+ hours per day of interdisciplinary therapy in a comprehensive inpatient rehab  setting. Physiatrist is providing close team supervision and 24 hour management of active medical problems listed below. Physiatrist and rehab team continue to assess barriers to discharge/monitor patient progress toward functional and medical goals  Care Tool:  Bathing    Body parts bathed by patient: Right arm, Chest, Abdomen, Front perineal area, Right upper leg, Left upper leg, Face   Body parts bathed by helper: Right lower leg, Left lower leg, Buttocks, Front perineal area, Left arm     Bathing assist Assist Level: Maximal Assistance - Patient 24 - 49%     Upper Body Dressing/Undressing Upper body dressing   What is the patient wearing?: Pull over shirt    Upper body assist Assist Level: Moderate Assistance - Patient 50 - 74%    Lower Body Dressing/Undressing Lower body dressing      What is the patient wearing?: Pants     Lower body assist Assist for lower body dressing: Maximal Assistance - Patient 25 - 49%     Toileting Toileting    Toileting assist Assist for toileting: Total Assistance - Patient < 25%     Transfers Chair/bed transfer  Transfers assist     Chair/bed transfer assist level: Maximal Assistance - Patient 25 -  49% (squat pivot)     Locomotion Ambulation   Ambulation assist   Ambulation activity did not occur: Safety/medical concerns  Assist level: Maximal Assistance - Patient 25 - 49% Assistive device: Other (comment) (hallway HR) Max distance: 30 ft   Walk 10 feet activity   Assist  Walk 10 feet activity did not occur: Safety/medical concerns  Assist level: Maximal Assistance - Patient 25 - 49% Assistive device: Other (comment) (hallway HR)   Walk 50 feet activity   Assist Walk 50 feet with 2 turns activity did not occur: Safety/medical concerns         Walk 150 feet activity   Assist Walk 150 feet activity did not occur: Safety/medical concerns         Walk 10 feet on uneven surface  activity   Assist  Walk 10 feet on uneven surfaces activity did not occur: Safety/medical concerns         Wheelchair     Assist Is the patient using a wheelchair?: Yes Type of Wheelchair: Manual Wheelchair activity did not occur: Safety/medical concerns  Wheelchair assist level: Moderate Assistance - Patient 50 - 74% Max wheelchair distance: 50 ft    Wheelchair 50 feet with 2 turns activity    Assist        Assist Level: Moderate Assistance - Patient 50 - 74%   Wheelchair 150 feet activity     Assist      Assist Level: Dependent - Patient 0%   Blood pressure 133/70, pulse 86, temperature 98.5 F (36.9 C), temperature source Oral, resp. rate 18, height 5\' 11"  (1.803 m), weight 69 kg, SpO2 98%.  Medical Problem List and Plan: 1. Functional deficits secondary to left basal ganglia ICH d/t hypertension/eliquis on 09/05/2023             -patient may shower             -ELOS/Goals: 10/07/23 goals min assist with PT, OT, SLP             - resting WHO and PRAFO    -Continue CIR therapies including PT, OT, and SLP      Rec SNF, requires 2 caregivers, no sig progress expected in short term , 2nd floor walk up inaccessible, SW to address  2.  Antithrombotics: -DVT/anticoagulation:Eliquis ( for A fib)  3. Pain Management: Tylenol prn. Voltaren gel QID.              -continue low dose gabapentin 100mg  at bedtime--dysesthetic pain improved Hemiplegic shoulder pain , may benefit from Zynex Estim device Cont scheduled acetaminophen  May need Right arm sling for transfers  4. Mood/Behavior/Sleep: LCSW to follow for evaluation and support.              -antipsychotic agents: N/A 5. Neuropsych/cognition: This patient is capable of making decisions on his own behalf. 6. Skin/Wound Care: Routine pressure relief measures. 7. Fluids/Electrolytes/Nutrition:               --added low salt restrictions. Add protein supplement    Intake of meals averaging ~25-50% Recorded intake of fluids  variable 173mL-600mL       Latest Ref Rng & Units 10/01/2023    6:27 AM 09/29/2023    5:01 AM 09/28/2023    4:48 AM  BMP  Glucose 70 - 99 mg/dL 161  096  045   BUN 8 - 23 mg/dL 29  35  34   Creatinine 0.61 - 1.24 mg/dL 4.09  8.11  2.00   Sodium 135 - 145 mmol/L 143  146  150   Potassium 3.5 - 5.1 mmol/L 3.8  4.2  4.1   Chloride 98 - 111 mmol/L 113  114  118   CO2 22 - 32 mmol/L 24  25  25    Calcium 8.9 - 10.3 mg/dL 9.0  8.9  9.3      8. T2DM insulin dependent: Hgb A1c-6.2 on 09/06/23             -diet controlled -CBG's not being checked   9. PAD s/o aortobifemoral bypass w/right pseudoaneurysm: SBP goal < 140.  --Plans for repair early December by Dr. Sherral Hammers? 10. COPD w/chronic bronchitis/emphysema: currently on 6 L oxygen. Titrate to keep sats > 90% per Dr. Vassie Loll.              --on Trelegy PTA --started on Breo/Incruse today.              --pulmonary toilet. Weaning down to baseline as able.  11. CAF: Monitor HR TID. On coreg 6.25mg  BID. Off eliquis due to ICH- per Neuro may resume on 11/25 if stable --Neurology recommends resuming  Eliquis in 2 weeks if neuro and BP stable.    -10/01/23 HR well controlled  Vitals:   10/03/23 0538 10/03/23 0823  BP: 133/70   Pulse: 86   Resp: 18   Temp: 98.5 F (36.9 C)   SpO2: 96% 98%   Sys BP mildly elevated due to IVF  12. Dilated cardiomyopathy/AS/chronic systolic CHF: Low salt diet. Check weight daily--up from 166-->173? --CXR 11/11 w/vascular congestion-->will order follow up CXR to check for fluid overload as cause of hypoxia.  --Monitor for signs of overload. On Lasix 20mg  daily, coreg 6.25mg  BID, Imdur 30mg  daily, hydralazine 100mg  TID -LVEF 55-60% -Weights stable , will d/c lasix due to elevated BUN/Creat   Filed Weights   10/01/23 0414 10/02/23 0522 10/03/23 0500  Weight: 71.9 kg 68.3 kg 69 kg    13. CKD: stage IIIb? -11/17 BUN/Cr stable at 33/1.82, 11/19 baseline creat likely in 1.6-1.8 range based on labs in system   Improving , fluid intake recorded at 11/18 but only 236 mL on 11/19, Creat up, hold lasix starting 11/21  11/22- no labs today, +350cc yesterday--continue to hold lasix 11-23: BUN and creatinine remain elevated, unchanged from prior.  Output not recorded from last 2 days.  No signs of volume overload, continue to hold Lasix.  Encourage p.o. fluids and recheck Monday.    Latest Ref Rng & Units 10/01/2023    6:27 AM 09/29/2023    5:01 AM 09/28/2023    4:48 AM  BMP  Glucose 70 - 99 mg/dL 811  914  782   BUN 8 - 23 mg/dL 29  35  34   Creatinine 0.61 - 1.24 mg/dL 9.56  2.13  0.86   Sodium 135 - 145 mmol/L 143  146  150   Potassium 3.5 - 5.1 mmol/L 3.8  4.2  4.1   Chloride 98 - 111 mmol/L 113  114  118   CO2 22 - 32 mmol/L 24  25  25    Calcium 8.9 - 10.3 mg/dL 9.0  8.9  9.3   Improving , d/c IVF after current bag finished    14. Dysphagia: On D1, nectar- likely contributing to poor intake of liquids               -encourage fluids, aspiration precautions   -see above  15.  HTN: continue amlodipine 10mg  daily, coreg 6.25mg  BID, clonidine 0.2mg  q8h, hydralazine 100mg  TID  BP reasonable off furosemide    Vitals:   09/30/23 1955 10/01/23 0420 10/01/23 0812 10/01/23 1437  BP: 135/71 130/61 133/65 (!) 153/71   10/01/23 1440 10/01/23 1941 10/01/23 2131 10/02/23 0436  BP: (!) 153/71 (!) 144/68 138/63 (!) 154/71   10/02/23 0623 10/02/23 1352 10/02/23 1925 10/03/23 0538  BP: (!) 168/72 134/73 (!) 143/58 133/70  Some lability but overall improving  16. Hx of seizure: continue keppra 500mg  BID- f/u neuro as OP   17. HLD: continue rosuvastatin 10mg  QHS  18.  Right hip pain no sig OA on Xray  19. Constipation:  Continue senokot S to 2 tabs BID since miralax might be difficult to achieve nectar thick; add dulcolax daily     LOS: 23 days A FACE TO FACE EVALUATION WAS PERFORMED  Drema Pry Narcissus Detwiler 10/03/2023, 11:37 AM

## 2023-10-03 NOTE — Progress Notes (Signed)
Occupational Therapy Session Note  Patient Details  Name: Kyle [name] MRN: [id number] Date of Birth: December 06, 1950  Today's Date: 10/03/2023 OT Individual Time: 1050-1200 OT Individual Time Calculation (min): 70 min    Short Term Goals: Week 3:  OT Short Term Goal 1 (Week 3): STGs = LTGs  Skilled Therapeutic Interventions/Progress Updates:  Pt greeted supine in bed, pt agreeable to OT intervention.      Transfers/bed mobility: pt completed supine>sit with MAXA, assist needed to maneuver BLEs to EOB using hooking method and assist needed to elevate trunk, however once EOB pt able to maintain sittign balance with CGA. Pt completed all squat pivot transfers to R and L side with MAX A, improved awareness with pt able to accurately initiate leaning forward and either R/L for transfer.   ADLs:  Grooming: provided oral care with total A as pt with large amount of mucous in his mouth UB dressing:donned shirt with MODA, pt needed assist to thread RUE but able to assist with remainder of task LB dressing: donned pants from bed level with MAX A, pt able to bridge while therapist and tech pulled pants to waist line.  Footwear: donned shoes/AFO with total A  Transfers: utilized stedy for toilet transfer with pt able to stand to stedy with MAX A, +2 assist provided d/t R lean and to support RUE on stedy.  Toileting: pt with continent bowel void with pt needing total A for 3/3 toileting tasks in standing from stedy.    NMR: pt completed multiple sit>stands from w/c in gym with mirror provided for visual awareness. Pt required MAX A +2 for safety to stand with HHA, emphasis on glute activation and upright posture. Pt additionally complete lateral leans to RUE from EOM with pt needing MODA to return to midline. Provided K tape to R shoulder d/t sublux.   Ended session with pt seated in w/c  with all needs within reach and safety belt alarm activated.   Recommended to nurse that pt sit up for 1 hour max.                   Therapy Documentation Precautions:  Precautions Precautions: Fall Precaution Comments: R hemiplegia, slight "pusher", SBP <140 Restrictions Weight Bearing Restrictions: No  Pain: Unrated pain reported in RUE, repositioning and K tape provided for + support.    Therapy/Group: Individual Therapy  Barron Schmid 10/03/2023, 12:31 PM

## 2023-10-03 NOTE — Plan of Care (Signed)
  Problem: Consults Goal: RH STROKE PATIENT EDUCATION Description: See Patient Education module for education specifics  Outcome: Progressing   Problem: RH BOWEL ELIMINATION Goal: RH STG MANAGE BOWEL WITH ASSISTANCE Description: STG Manage Bowel with toileting Assistance. Outcome: Progressing Goal: RH STG MANAGE BOWEL W/MEDICATION W/ASSISTANCE Description: STG Manage Bowel with Medication with mod I Assistance. Outcome: Progressing   Problem: RH SAFETY Goal: RH STG ADHERE TO SAFETY PRECAUTIONS W/ASSISTANCE/DEVICE Description: STG Adhere to Safety Precautions With cues Assistance/Device. Outcome: Progressing   Problem: RH KNOWLEDGE DEFICIT Goal: RH STG INCREASE KNOWLEDGE OF DIABETES Description: Patient and wife will be able to manage prediabetes with dietary modification using educational resources independently Outcome: Progressing Goal: RH STG INCREASE KNOWLEDGE OF HYPERTENSION Description: Patient and wife will be able to manage HTN with medications and dietary modification using educational resources independently Outcome: Progressing Goal: RH STG INCREASE KNOWLEDGE OF DYSPHAGIA/FLUID INTAKE Description: Patient and wife will be able to manage dysphagia, medications and dietary modification using educational resources independently Outcome: Progressing Goal: RH STG INCREASE KNOWLEGDE OF HYPERLIPIDEMIA Description: Patient and wife will be able to manage HLD with medications and dietary modification using educational resources independently Outcome: Progressing

## 2023-10-04 LAB — CBC
HCT: 41.5 % (ref 39.0–52.0)
Hemoglobin: 13.1 g/dL (ref 13.0–17.0)
MCH: 29.6 pg (ref 26.0–34.0)
MCHC: 31.6 g/dL (ref 30.0–36.0)
MCV: 93.7 fL (ref 80.0–100.0)
Platelets: 208 10*3/uL (ref 150–400)
RBC: 4.43 MIL/uL (ref 4.22–5.81)
RDW: 15.3 % (ref 11.5–15.5)
WBC: 5.2 10*3/uL (ref 4.0–10.5)
nRBC: 0 % (ref 0.0–0.2)

## 2023-10-04 LAB — BASIC METABOLIC PANEL
Anion gap: 10 (ref 5–15)
BUN: 27 mg/dL — ABNORMAL HIGH (ref 8–23)
CO2: 22 mmol/L (ref 22–32)
Calcium: 9.2 mg/dL (ref 8.9–10.3)
Chloride: 115 mmol/L — ABNORMAL HIGH (ref 98–111)
Creatinine, Ser: 1.75 mg/dL — ABNORMAL HIGH (ref 0.61–1.24)
GFR, Estimated: 41 mL/min — ABNORMAL LOW (ref >60)
Glucose, Bld: 138 mg/dL — ABNORMAL HIGH (ref 70–99)
Potassium: 3.8 mmol/L (ref 3.5–5.1)
Sodium: 147 mmol/L — ABNORMAL HIGH (ref 135–145)

## 2023-10-04 MED ORDER — DEXTROSE-SODIUM CHLORIDE 5-0.2 % IV SOLN: INTRAVENOUS | Status: AC

## 2023-10-04 NOTE — Progress Notes (Addendum)
Patient ID: Kyle [name], male   DOB: 14-Jul-1951, 72 y.o.   MRN: [id number]  Pt and spouse have provided SNF preferences of:  [facility]  11:09 AM: Authorization started with [employer] at [phone].   Reference number: [id number] Clinicals faxed at [phone]

## 2023-10-04 NOTE — Progress Notes (Signed)
Speech Language Pathology Daily Session Note  Patient Details  Name: Kyle [name] MRN: [id number] Date of Birth: May 08, 1951  Today's Date: 10/04/2023 SLP Individual Time: 0923-1000 SLP Individual Time Calculation (min): 37 min and Today's Date: 10/04/2023 SLP Missed Time: 23 Minutes Missed Time Reason: Nursing care  Short Term Goals: Week 4: SLP Short Term Goal 1 (Week 4): STG=LTG due to ELOS  Skilled Therapeutic Interventions:  Patient was seen in am to address speech intelligibility and dysphagia management. Pt was alert upon SLP arrival and seated upright in WC. SLP assisted with oral hygiene and pt was subsequently presented with Dys 2 trials. Pt with adequate use of swallowing precautions as indicated by administering small bolus at a time. Pt with min oral residue and thick secretions noted at anterior of mouth however; suspect thick secretions were denture cream. Secretions cleared successfully with liquid wash. However, continued to arise intermittently throughout session in absence of bolus. SLP recommending continuation of Dys 1 solids and nectar liquids. Pt may benefit form Dys 2 trial tray in upcoming sessions.  In other minutes of session, SLP addressed speech intelligibility. Pt with improved speech intelligibility from last time this SLP treated. Pt communicated at the phrase level with ~70-75% intelligibility. Pt able to self correct given sup to min A with use of overarticulation strategeis as needed. At conclusion of session, pt was left seated upright in [facility] with call button within reach and chair  alarm active. SLP to continue POC.   Pain Pain Assessment Pain Scale: 0-10 Pain Score: 0-No pain  Therapy/Group: Individual Therapy  Renaee Munda 10/04/2023, 9:51 AM

## 2023-10-04 NOTE — Progress Notes (Signed)
Occupational Therapy Session Note  Patient Details  Name: Kyle [name] MRN: [id number] Date of Birth: 14-Nov-1950  Today's Date: 10/04/2023 OT Individual Time: 4098-1191 OT Individual Time Calculation (min): 71 min    Short Term Goals: Week 3:  OT Short Term Goal 1 (Week 3): STGs = LTGs  Skilled Therapeutic Interventions/Progress Updates:  Pt greeted supine in bed, pt reports not getting breakfast yet. Retrieved tray for pt and pt agreeable to OT intervention.      Transfers/bed mobility: pt completed supine>sit with MODA +2, assist needed for initiation and elevation of trunk/maneuver RLE to EOB. Pt completed squat pivot to w/c to L side with MODA +2 for safety. Practiced more transfers in the gym to R and L side with pt needing more assist to R side and MAX mutlimodal cues for sequencing and technique but pt able to complete transfer to strong L side with MODA+2 only for safety.    ADLs:  UB dressing:pt donned [place] shirt from EOB with MODA, assist needed to thread RUE into sleeve but pt able to assist with remainder of task. Focused on sitting balance from EOB while completing dressing with pt needing up to MOD A at times d/t R lean.  LB dressing: donned pants from EOB with MAX A, assist needed to thread both LEs with pt standing from EOB with MODA +2 for safety, pt did assist with pulling pants to waist line in standing with LUE.  Footwear: donned shoes/AFO with total A for time mgmt   Bathing: pt completed bathing from EOB with MODA from EOB, pt motivated to attempt to use RUE during bathing but no active assist noted. Emphasis on using RUE as gross assist.   Self feeding: set pt up for self feeding in bed with bed elevated to 40*, pt needed set- up assist for lids etc but able to self feed with LUE with supervision. Emphasis on set up of items to optimize positioning and facilitate independence with ADLS as well as decrease caregiver burden.    Ended session with pt seated in w/c with  all needs within reach and NT present to don alarm belt.           Pt on 3L [place] during session, VSS     Therapy Documentation Precautions:  Precautions Precautions: Fall Precaution Comments: R hemiplegia, slight "pusher", SBP <140 Restrictions Weight Bearing Restrictions: No  Pain: Unrated pain reported in R shoulder, donned sling for increased support during session    Therapy/Group: Individual Therapy  Barron Schmid 10/04/2023, 12:12 PM

## 2023-10-04 NOTE — Progress Notes (Signed)
Physical Therapy Session Note  Patient Details  Name: Kyle [name] MRN: [id number] Date of Birth: Sep 23, 1951  Today's Date: 10/04/2023 PT Individual Time: 1302-1420 PT Individual Time Calculation (min): 78 min   Short Term Goals: Week 2:  PT Short Term Goal 1 (Week 2): Pt will roll side to side w/ mod A. PT Short Term Goal 1 - Progress (Week 2): Progressing toward goal PT Short Term Goal 2 (Week 2): Pt will transfer sup to sit w/ mod A. PT Short Term Goal 2 - Progress (Week 2): Progressing toward goal PT Short Term Goal 3 (Week 2): Pt will perform squat pivot transfer w/ mod A. PT Short Term Goal 3 - Progress (Week 2): Progressing toward goal PT Short Term Goal 4 (Week 2): Pt will ambulate 30 ft using LRAD with maxA. PT Short Term Goal 4 - Progress (Week 2): Progressing toward goal PT Short Term Goal 5 (Week 2): Pt will initiate w/c mobility. PT Short Term Goal 5 - Progress (Week 2): Met Week 3:  PT Short Term Goal 1 (Week 3): Pt will roll side to side w/ mod A. PT Short Term Goal 2 (Week 3): Pt will perform supine<>to sit w/ mod A. PT Short Term Goal 3 (Week 3): Pt will perform squat pivot transfer w/ mod A. PT Short Term Goal 4 (Week 3): Pt will ambulate 30 ft using LRAD with maxA. PT Short Term Goal 5 (Week 3): Pt will propel w/c using hemitechnique and MinA Week 4:     Skilled Therapeutic Interventions/Progress Updates:      Therapy Documentation Precautions:  Precautions Precautions: Fall Precaution Comments: R hemiplegia, slight "pusher", SBP <140 Restrictions Weight Bearing Restrictions: No General:   Vital Signs:   Pain:   Mobility:   Locomotion :    Trunk/Postural Assessment :    Balance:   Exercises:   Other Treatments:      Therapy/Group: Individual Therapy  Loel Dubonnet PT, DPT, CSRS 10/04/2023, 6:35 PM

## 2023-10-04 NOTE — Progress Notes (Signed)
PROGRESS NOTE   Subjective/Complaints:  Fluid intake still poor  Working with OT, sitting balance still poor  ROS: as per HPI. Denies CP, SOB, abd pain, N/V/D/C, or any other complaints at this time.    Objective:   No results found. Recent Labs    10/04/23 0535  WBC 5.2  HGB 13.1  HCT 41.5  PLT 208     Recent Labs    10/04/23 0535  NA 147*  K 3.8  CL 115*  CO2 22  GLUCOSE 138*  BUN 27*  CREATININE 1.75*  CALCIUM 9.2     Intake/Output Summary (Last 24 hours) at 10/04/2023 0805 Last data filed at 10/04/2023 0505 Gross per 24 hour  Intake 238 ml  Output 200 ml  Net 38 ml         Physical Exam: Vital Signs Blood pressure 137/69, pulse 86, temperature 98.2 F (36.8 C), temperature source Oral, resp. rate 12, height 5\' 11"  (1.803 m), weight 69.1 kg, SpO2 94%.  General: No acute distress Mood and affect are appropriate Heart: Regular rate and rhythm no rubs murmurs or extra sounds Lungs: Clear to auscultation, breathing unlabored, no rales or wheezes Abdomen: Positive bowel sounds, soft nontender to palpation, nondistended Extremities: No clubbing, cyanosis, Skin: No evidence of breakdown, no evidence of rash Mild dorsal hand edema  No edema in LE Neuro:  pt is alert and oriented. Follows basic commands. Fair insight and awareness. Speech with ongoing  dysarthria. Right central 7 remains. Antigravity and against resistance in left upper and lower extremities; flaccid right upper and lower extremity Flaccid tone -  RUE 0/5, RLE --trace HF/E and 0/5 KE/ADF/APF. MSK- no pain with R finger wrist or elbow ROM , 1.5FB sublux R shoulder , pain with PROM right shoulder esp with ext rotation, stable 12/9  Assessment/Plan: 1. Functional deficits which require 3+ hours per day of interdisciplinary therapy in a comprehensive inpatient rehab setting. Physiatrist is providing close team supervision and 24 hour  management of active medical problems listed below. Physiatrist and rehab team continue to assess barriers to discharge/monitor patient progress toward functional and medical goals  Care Tool:  Bathing    Body parts bathed by patient: Right arm, Chest, Abdomen, Front perineal area, Right upper leg, Left upper leg, Face   Body parts bathed by helper: Right lower leg, Left lower leg, Buttocks, Front perineal area, Left arm     Bathing assist Assist Level: Maximal Assistance - Patient 24 - 49%     Upper Body Dressing/Undressing Upper body dressing   What is the patient wearing?: Pull over shirt    Upper body assist Assist Level: Moderate Assistance - Patient 50 - 74%    Lower Body Dressing/Undressing Lower body dressing      What is the patient wearing?: Pants     Lower body assist Assist for lower body dressing: Maximal Assistance - Patient 25 - 49%     Toileting Toileting    Toileting assist Assist for toileting: Total Assistance - Patient < 25%     Transfers Chair/bed transfer  Transfers assist     Chair/bed transfer assist level: Maximal Assistance - Patient 25 - 49% (  squat pivot)     Locomotion Ambulation   Ambulation assist   Ambulation activity did not occur: Safety/medical concerns  Assist level: Maximal Assistance - Patient 25 - 49% Assistive device: Other (comment) (hallway HR) Max distance: 30 ft   Walk 10 feet activity   Assist  Walk 10 feet activity did not occur: Safety/medical concerns  Assist level: Maximal Assistance - Patient 25 - 49% Assistive device: Other (comment) (hallway HR)   Walk 50 feet activity   Assist Walk 50 feet with 2 turns activity did not occur: Safety/medical concerns         Walk 150 feet activity   Assist Walk 150 feet activity did not occur: Safety/medical concerns         Walk 10 feet on uneven surface  activity   Assist Walk 10 feet on uneven surfaces activity did not occur: Safety/medical  concerns         Wheelchair     Assist Is the patient using a wheelchair?: Yes Type of Wheelchair: Manual Wheelchair activity did not occur: Safety/medical concerns  Wheelchair assist level: Moderate Assistance - Patient 50 - 74% Max wheelchair distance: 50 ft    Wheelchair 50 feet with 2 turns activity    Assist        Assist Level: Moderate Assistance - Patient 50 - 74%   Wheelchair 150 feet activity     Assist      Assist Level: Dependent - Patient 0%   Blood pressure 137/69, pulse 86, temperature 98.2 F (36.8 C), temperature source Oral, resp. rate 12, height 5\' 11"  (1.803 m), weight 69.1 kg, SpO2 94%.  Medical Problem List and Plan: 1. Functional deficits secondary to left basal ganglia ICH d/t hypertension/eliquis on 09/05/2023             -patient may shower             -ELOS/Goals: 10/07/23 goals min assist with PT, OT, SLP             - resting WHO and PRAFO    -Continue CIR therapies including PT, OT, and SLP      Rec SNF,  2.  Antithrombotics: -DVT/anticoagulation:Eliquis ( for A fib)  3. Pain Management: Tylenol prn. Voltaren gel QID.              -continue low dose gabapentin 100mg  at bedtime--dysesthetic pain improved Hemiplegic shoulder pain , may benefit from Zynex Estim device Cont scheduled acetaminophen  May need Right arm sling for transfers  4. Mood/Behavior/Sleep: LCSW to follow for evaluation and support.              -antipsychotic agents: N/A 5. Neuropsych/cognition: This patient is capable of making decisions on his own behalf. 6. Skin/Wound Care: Routine pressure relief measures. 7. Fluids/Electrolytes/Nutrition:               --added low salt restrictions. Add protein supplement    Intake of meals averaging ~25-50% Recorded intake of fluids variable 169mL-600mL       Latest Ref Rng & Units 10/04/2023    5:35 AM 10/01/2023    6:27 AM 09/29/2023    5:01 AM  BMP  Glucose 70 - 99 mg/dL 696  295  284   BUN 8 - 23 mg/dL 27   29  35   Creatinine 0.61 - 1.24 mg/dL 1.32  4.40  1.02   Sodium 135 - 145 mmol/L 147  143  146   Potassium 3.5 - 5.1  mmol/L 3.8  3.8  4.2   Chloride 98 - 111 mmol/L 115  113  114   CO2 22 - 32 mmol/L 22  24  25    Calcium 8.9 - 10.3 mg/dL 9.2  9.0  8.9      8. W0JW insulin dependent: Hgb A1c-6.2 on 09/06/23             -diet controlled -CBG's not being checked   9. PAD s/o aortobifemoral bypass w/right pseudoaneurysm: SBP goal < 140.  --Plans for repair early December by Dr. Sherral Hammers? 10. COPD w/chronic bronchitis/emphysema: currently on 6 L oxygen. Titrate to keep sats > 90% per Dr. Vassie Loll.              --on Trelegy PTA --started on Breo/Incruse today.              --pulmonary toilet. Weaning down to baseline as able.  11. CAF: Monitor HR TID. On coreg 6.25mg  BID. Off eliquis due to ICH- per Neuro may resume on 11/25 if stable --Neurology recommends resuming  Eliquis in 2 weeks if neuro and BP stable.    -10/04/23 HR well controlled  Vitals:   10/03/23 1950 10/04/23 0336  BP: 132/71 137/69  Pulse: 83 86  Resp: 18 12  Temp: 98.3 F (36.8 C) 98.2 F (36.8 C)  SpO2: 90% 94%   Sys BP mildly elevated due to IVF  12. Dilated cardiomyopathy/AS/chronic systolic CHF: Low salt diet. Check weight daily--up from 166-->173? --CXR 11/11 w/vascular congestion-->will order follow up CXR to check for fluid overload as cause of hypoxia.  --Monitor for signs of overload. On Lasix 20mg  daily, coreg 6.25mg  BID, Imdur 30mg  daily, hydralazine 100mg  TID -LVEF 55-60% -Weights stable , will d/c lasix due to elevated BUN/Creat   Filed Weights   10/02/23 0522 10/03/23 0500 10/04/23 0442  Weight: 68.3 kg 69 kg 69.1 kg    13. CKD: stage IIIb? -11/17 BUN/Cr stable at 33/1.82, 11/19 baseline creat likely in 1.6-1.8 range based on labs in system  Off lasix since 11/21 Still having poor fluid intake po     Latest Ref Rng & Units 10/04/2023    5:35 AM 10/01/2023    6:27 AM 09/29/2023    5:01 AM  BMP   Glucose 70 - 99 mg/dL 119  147  829   BUN 8 - 23 mg/dL 27  29  35   Creatinine 0.61 - 1.24 mg/dL 5.62  1.30  8.65   Sodium 135 - 145 mmol/L 147  143  146   Potassium 3.5 - 5.1 mmol/L 3.8  3.8  4.2   Chloride 98 - 111 mmol/L 115  113  114   CO2 22 - 32 mmol/L 22  24  25    Calcium 8.9 - 10.3 mg/dL 9.2  9.0  8.9   Give IVF 1 L today D5 .2NS   14. Dysphagia: On D1, nectar- likely contributing to poor intake of liquids               -encourage fluids, aspiration precautions   -see above  15. HTN: continue amlodipine 10mg  daily, coreg 6.25mg  BID, clonidine 0.2mg  q8h, hydralazine 100mg  TID  BP reasonable off furosemide    Vitals:   10/01/23 1437 10/01/23 1440 10/01/23 1941 10/01/23 2131  BP: (!) 153/71 (!) 153/71 (!) 144/68 138/63   10/02/23 0436 10/02/23 0623 10/02/23 1352 10/02/23 1925  BP: (!) 154/71 (!) 168/72 134/73 (!) 143/58   10/03/23 0538 10/03/23 1400 10/03/23 1950 10/04/23 0336  BP: 133/70 130/77 132/71 137/69  Some lability but overall improving  16. Hx of seizure: continue keppra 500mg  BID- f/u neuro as OP   17. HLD: continue rosuvastatin 10mg  QHS  18.  Right hip pain no sig OA on Xray  19. Constipation:  Continue senokot S to 2 tabs BID since miralax might be difficult to achieve nectar thick; add dulcolax daily     LOS: 24 days A FACE TO FACE EVALUATION WAS PERFORMED  Erick Colace 10/04/2023, 8:05 AM

## 2023-10-04 NOTE — Discharge Summary (Incomplete)
Physician Discharge Summary  Patient ID: Kyle [name] MRN: [id number] DOB/AGE: 05-10-1951 72 y.o.  Admit date: 09/10/2023 Discharge date: 10/04/2023  Discharge Diagnoses:  Principal Problem:   ICH (intracerebral hemorrhage) (HCC)   Discharged Condition: {condition:18240}  Significant Diagnostic Studies: CT HEAD WO CONTRAST ( )  Result Date: 09/21/2023 CLINICAL DATA:  Stroke, hemorrhagic EXAM: CT HEAD WITHOUT CONTRAST TECHNIQUE: Contiguous axial images were obtained from the base of the skull through the vertex without intravenous contrast. RADIATION DOSE REDUCTION: This exam was performed according to the departmental dose-optimization program which includes automated exposure control, adjustment of the mA and/or kV according to patient size and/or use of iterative reconstruction technique. COMPARISON:  Head CT 09/21/2023 FINDINGS: Brain: Interval decrease in size of the hemorrhage centered in the left lentiform nucleus with surrounding vasogenic edema. Unchanged minimal rightward midline shift. No new sites of hemorrhage. No CT evidence of an acute cortical infarct. No mass effect. No mass lesion. Vascular: No hyperdense vessel or unexpected calcification. Skull: Normal. Negative for fracture or focal lesion. Sinuses/Orbits: No middle ear or mastoid effusion. Paranasal sinuses are clear. Bilateral lens replacement. Orbits are otherwise unremarkable. Other: None. IMPRESSION: Interval decrease in size of the hemorrhage centered in the left lentiform nucleus with surrounding vasogenic edema. No new sites of hemorrhage identified. Electronically Signed   By: Lorenza Cambridge M.D.   On: 09/21/2023 09:01   DG HIP UNILAT WITH PELVIS 2-3 VIEWS RIGHT  Result Date: 09/20/2023 CLINICAL DATA:  72 year old male with right hip pain. EXAM: DG HIP (WITH OR WITHOUT PELVIS) 2-3V RIGHT COMPARISON:  CTA Chest, Abdomen, and Pelvis 09/06/2023. FINDINGS: AP pelvis, AP and frogleg lateral views of the right hip at  0940 hours. AP pelvis and hip views are mildly oblique to the right. Femoral heads remain normally located. Pelvis appears intact. Nonobstructed bowel gas. Vascular calcifications in the pelvis. Right inguinal and right thigh surgical clips. Proximal right femur intact. No acute osseous abnormality identified. IMPRESSION: No acute osseous abnormality identified about the right hip or pelvis. Electronically Signed   By: Odessa Fleming M.D.   On: 09/20/2023 12:29   DG Chest 2 View  Result Date: 09/10/2023 CLINICAL DATA:  Shortness of breath EXAM: CHEST - 2 VIEW COMPARISON:  09/06/2023 FINDINGS: Cardiomegaly. Mild vascular congestion. No overt edema or effusions. No acute bony abnormality. IMPRESSION: Cardiomegaly, vascular congestion. Electronically Signed   By: Charlett Nose M.D.   On: 09/10/2023 20:06   DG CHEST PORT 1 VIEW  Result Date: 09/06/2023 CLINICAL DATA:  Respiratory abnormality EXAM: PORTABLE CHEST 1 VIEW COMPARISON:  X-ray 04/10/2021.  CT 09/06/2023 earlier. FINDINGS: Enlarged cardiopericardial silhouette. Vascular congestion. Chronic lung changes. No pneumothorax, effusion or consolidation. Slight thickening along the minor fissure. IMPRESSION: Enlarged cardiac silhouette.  Vascular congestion. Electronically Signed   By: Karen Kays M.D.   On: 09/06/2023 19:19   ECHOCARDIOGRAM COMPLETE  Result Date: 09/06/2023    ECHOCARDIOGRAM REPORT   Patient Name:   Kyle [name] Date of Exam: 09/06/2023 Medical Rec #:  [id number]      Height:       71.0 in Accession #:    [id number]     Weight:       166.4 lb Date of Birth:  02/19/1951      BSA:          1.950 m Patient Age:    72 years       BP:           134/109 mmHg Patient Gender:  M              HR:           75 bpm. Exam Location:  Inpatient Procedure: 2D Echo, 3D Echo, Cardiac Doppler, Color Doppler and Strain Analysis Indications:    Stroke  History:        Patient has prior history of Echocardiogram examinations, most                 recent  04/05/2023. Risk Factors:Diabetes, Dyslipidemia and                 Hypertension.  Sonographer:    Karma Ganja Referring Phys: [id number] Marvel Plan  Sonographer Comments: Global longitudinal strain was attempted. IMPRESSIONS  1. Left ventricular ejection fraction, by estimation, is 55 to 60%. The left ventricle has normal function. The left ventricle has no regional wall motion abnormalities. There is moderate concentric left ventricular hypertrophy. Left ventricular diastolic parameters are consistent with Grade I diastolic dysfunction (impaired relaxation). The average left ventricular global longitudinal strain is -15.5 %. The global longitudinal strain is abnormal.  2. Right ventricular systolic function is normal. The right ventricular size is normal. There is normal pulmonary artery systolic pressure. The estimated right ventricular systolic pressure is 35.6 mmHg.  3. Left atrial size was mildly dilated.  4. Right atrial size was mildly dilated.  5. The mitral valve is normal in structure. No evidence of mitral valve regurgitation. No evidence of mitral stenosis.  6. The aortic valve is tricuspid. There is severe calcifcation of the aortic valve. Aortic valve regurgitation is mild. Severe aortic valve stenosis. Aortic valve mean gradient measures 36.0 mmHg, AVA 0.95 cm^2.  7. The inferior vena cava is dilated in size with <50% respiratory variability, suggesting right atrial pressure of 15 mmHg. FINDINGS  Left Ventricle: Left ventricular ejection fraction, by estimation, is 55 to 60%. The left ventricle has normal function. The left ventricle has no regional wall motion abnormalities. The average left ventricular global longitudinal strain is -15.5 %. The global longitudinal strain is abnormal. The left ventricular internal cavity size was normal in size. There is moderate concentric left ventricular hypertrophy. Left ventricular diastolic parameters are consistent with Grade I diastolic dysfunction (impaired  relaxation). Right Ventricle: The right ventricular size is normal. No increase in right ventricular wall thickness. Right ventricular systolic function is normal. There is normal pulmonary artery systolic pressure. The tricuspid regurgitant velocity is 2.27 m/s, and  with an assumed right atrial pressure of 15 mmHg, the estimated right ventricular systolic pressure is 35.6 mmHg. Left Atrium: Left atrial size was mildly dilated. Right Atrium: Right atrial size was mildly dilated. Pericardium: There is no evidence of pericardial effusion. Mitral Valve: The mitral valve is normal in structure. There is mild calcification of the mitral valve leaflet(s). Mild mitral annular calcification. No evidence of mitral valve regurgitation. No evidence of mitral valve stenosis. Tricuspid Valve: The tricuspid valve is normal in structure. Tricuspid valve regurgitation is trivial. Aortic Valve: The aortic valve is tricuspid. There is severe calcifcation of the aortic valve. Aortic valve regurgitation is mild. Aortic regurgitation PHT measures 509 msec. Severe aortic stenosis is present. Aortic valve mean gradient measures 36.0 mmHg. Aortic valve peak gradient measures 59.7 mmHg. Aortic valve area, by VTI measures 1.03 cm. Pulmonic Valve: The pulmonic valve was normal in structure. Pulmonic valve regurgitation is not visualized. Aorta: The aortic root is normal in size and structure. Venous: The inferior vena cava is dilated in size  with less than 50% respiratory variability, suggesting right atrial pressure of 15 mmHg. IAS/Shunts: No atrial level shunt detected by color flow Doppler.  LEFT VENTRICLE PLAX 2D LVIDd:         4.70 cm   Diastology LVIDs:         2.70 cm   LV e' medial:    5.55 cm/s LV PW:         1.40 cm   LV E/e' medial:  12.9 LV IVS:        1.20 cm   LV e' lateral:   8.05 cm/s LVOT diam:     2.00 cm   LV E/e' lateral: 8.9 LV SV:         76 LV SV Index:   39        2D Longitudinal Strain LVOT Area:     3.14 cm  2D  Strain GLS Avg:     -15.5 %                           3D Volume EF:                          3D EF:        50 %                          LV EDV:       172 ml                          LV ESV:       86 ml                          LV SV:        87 ml RIGHT VENTRICLE             IVC RV Basal diam:  4.60 cm     IVC diam: 2.20 cm RV S prime:     16.20 cm/s TAPSE (M-mode): 3.2 cm LEFT ATRIUM             Index        RIGHT ATRIUM           Index LA diam:        3.60 cm 1.85 cm/m   RA Area:     23.40 cm LA Vol (A2C):   75.6 ml 38.76 ml/m  RA Volume:   73.60 ml  37.74 ml/m LA Vol (A4C):   56.3 ml 28.87 ml/m LA Biplane Vol: 67.3 ml 34.51 ml/m  AORTIC VALVE AV Area (Vmax):    0.98 cm AV Area (Vmean):   0.96 cm AV Area (VTI):     1.03 cm AV Vmax:           386.33 cm/s AV Vmean:          271.667 cm/s AV VTI:            0.738 m AV Peak Grad:      59.7 mmHg AV Mean Grad:      36.0 mmHg LVOT Vmax:         120.00 cm/s LVOT Vmean:        83.200 cm/s LVOT VTI:          0.242 m LVOT/AV VTI ratio: 0.33 AI PHT:  509 msec  AORTA Ao Root diam: 3.40 cm Ao Asc diam:  3.20 cm MITRAL VALVE                TRICUSPID VALVE MV Area (PHT): 3.99 cm     TR Peak grad:   20.6 mmHg MV Decel Time: 190 msec     TR Vmax:        227.00 cm/s MV E velocity: 71.70 cm/s MV A velocity: 108.00 cm/s  SHUNTS MV E/A ratio:  0.66         Systemic VTI:  0.24 m                             Systemic Diam: 2.00 cm Dalton McleanMD Electronically signed by Wilfred Lacy Signature Date/Time: 09/06/2023/4:34:36 PM    Final    MR BRAIN WO CONTRAST  Result Date: 09/06/2023 CLINICAL DATA:  Neuro deficit, acute, stroke suspected. EXAM: MRI HEAD WITHOUT CONTRAST TECHNIQUE: Multiplanar, multiecho pulse sequences of the brain and surrounding structures were obtained without intravenous contrast. COMPARISON:  Head CT and CTA head/neck 09/06/2023. MRI brain 12/24/2019. FINDINGS: Brain: Unchanged acute hemorrhage centered within the left lentiform nuclei with  mild surrounding edema and mild mass effect on the left lateral ventricle. Background of mild-to-moderate chronic small-vessel disease. No hydrocephalus or extra-axial collection. Vascular: Normal flow voids. Skull and upper cervical spine: Normal marrow signal. Sinuses/Orbits: No acute findings. Other: None. IMPRESSION: Unchanged acute hemorrhage centered within the left lentiform nuclei with mild surrounding edema and mild mass effect on the left lateral ventricle. Electronically Signed   By: Orvan Falconer M.D.   On: 09/06/2023 09:13   CT Angio Chest/Abd/Pel for Dissection W and/or Wo Contrast  Result Date: 09/06/2023 CLINICAL DATA:  72 year old male with signs and symptoms concerning for possible acute aortic syndrome. EXAM: CT ANGIOGRAPHY CHEST, ABDOMEN AND PELVIS TECHNIQUE: Non-contrast CT of the chest was initially obtained. Multidetector CT imaging through the chest, abdomen and pelvis was performed using the standard protocol during bolus administration of intravenous contrast. Multiplanar reconstructed images and MIPs were obtained and reviewed to evaluate the vascular anatomy. RADIATION DOSE REDUCTION: This exam was performed according to the departmental dose-optimization program which includes automated exposure control, adjustment of the mA and/or kV according to patient size and/or use of iterative reconstruction technique. CONTRAST:  OMNIPAQUE IOHEXOL 350 MG/ML SOLN COMPARISON:  No priors. FINDINGS: CTA CHEST FINDINGS Cardiovascular: Precontrast images demonstrate no crescentic high attenuation associated with the wall of the thoracic aorta to indicate the presence of acute intramural hemorrhage. Postcontrast images demonstrate extensive atherosclerosis of the thoracic aorta, without evidence of aneurysm or dissection (study is substantially limited by extensive pulsation artifact). 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 Multiple ulcerated plaques are also noted throughout the thoracic aorta. Atherosclerotic calcifications are also noted in the left main, left anterior descending and left circumflex coronary arteries. Severe thickening and calcification of the aortic valve is also noted. Heart size is mildly enlarged with concentric left ventricular hypertrophy. There is no significant pericardial fluid, thickening or pericardial calcification. Mediastinum/Nodes: There are multiple prominent borderline enlarged mediastinal and hilar lymph nodes, nonspecific. Esophagus is unremarkable in appearance. No axillary lymphadenopathy. Lungs/Pleura: In the inferior aspect of the right lower lobe (axial image 124 of series 7) there is a 5 mm pulmonary nodule. No other larger more  suspicious appearing pulmonary nodules or masses are noted. No acute consolidative airspace disease. No pleural effusions. Diffuse bronchial wall thickening with moderate to severe centrilobular and paraseptal emphysema. Musculoskeletal: There are no aggressive appearing lytic or blastic lesions noted in the visualized portions of the skeleton. Review of the MIP images confirms the above findings. CTA ABDOMEN AND PELVIS FINDINGS VASCULAR Aorta: Extensive atherosclerosis throughout the abdominal aorta with multiple ulcerated plaques. 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 Patient is status post aorto bi femoral bypass graft, which is widely patent. 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 Celiac: Patent without evidence of aneurysm, dissection, vasculitis or significant stenosis. SMA: Patent without evidence of aneurysm, dissection, vasculitis or significant stenosis. Renals: Left renal artery is widely patent. Severe high-grade stenosis just beyond the ostium  of the right renal artery, best appreciated on coronal image 81 of series 9. IMA: Occluded at the ostium, but reconstituted distally, presumably from collateral pathways. Inflow: Native common iliac and external iliac arteries appear completely occluded on the right. On the left, the native common iliac artery is completely occluded with the left external iliac artery nearly completely occluded (some distal flow). Aortobifemoral bypass graft is widely patent bilaterally. 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 Veins: No obvious venous abnormality within the limitations of this arterial phase study. Review of the MIP images confirms the above findings. NON-VASCULAR Hepatobiliary: Liver has a slightly shrunken appearance and nodular contour, suggesting a background of cirrhosis. No definite suspicious cystic or solid hepatic lesions. No intra or extrahepatic biliary ductal dilatation. Gallbladder is unremarkable in appearance. Pancreas: No pancreatic mass. No pancreatic ductal dilatation. No pancreatic or peripancreatic fluid collections or inflammatory changes. Spleen: Unremarkable. Adrenals/Urinary Tract: Moderate right renal atrophy. No suspicious renal lesions. No hydroureteronephrosis. Urinary bladder is unremarkable in appearance. Bilateral adrenal glands are unremarkable in appearance. Stomach/Bowel: The appearance of the stomach is normal. There is no pathologic dilatation of small bowel or colon. Numerous colonic diverticuli are noted, without surrounding inflammatory changes to suggest an acute diverticulitis at this time. Normal appendix. Lymphatic: No  lymphadenopathy noted in the abdomen or pelvis. Reproductive: Prostate gland and seminal vesicles are unremarkable in appearance. Other: No significant volume of ascites.  No pneumoperitoneum. Musculoskeletal: There are no aggressive appearing lytic or blastic lesions noted in the visualized portions of the skeleton. Review of the MIP images confirms the above findings. IMPRESSION: 1. 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 2. Patient is status post aorto bi-iliac bypass graft. Both limbs of the graft are widely patent. 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 Consultation with vascular surgery is strongly recommended for further clinical evaluation and management. 3. 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 4. Aortic atherosclerosis, in addition to left main and 2 vessel coronary artery disease. 5. There  are calcifications of the aortic valve. Echocardiographic, as above. Correlation for evaluation of potential valvular dysfunction may be warranted if clinically indicated. 6. Cardiomegaly with concentric left ventricular hypertrophy. 7. Colonic diverticulosis without evidence of acute diverticulitis at this time. 8. 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 9. Additional incidental findings Electronically Signed   By: Trudie Reed M.D.   On: 09/06/2023 06:50   CT ANGIO HEAD NECK W WO CM (CODE STROKE)  Result Date: 09/06/2023 CLINICAL DATA:  72 year old male code stroke presentation with right side weakness and left lentiform acute hemorrhage. EXAM: CT ANGIOGRAPHY HEAD AND NECK WITH AND WITHOUT CONTRAST TECHNIQUE: Multidetector CT  imaging of the head and neck was performed using the standard protocol during bolus administration of intravenous contrast. Multiplanar CT image reconstructions and MIPs were obtained to evaluate the vascular anatomy. Carotid stenosis measurements (when applicable) are obtained utilizing NASCET criteria, using the distal internal carotid diameter as the denominator. RADIATION DOSE REDUCTION: This exam was performed according to the departmental dose-optimization program which includes automated exposure control, adjustment of the mA and/or kV according to patient size and/or use of iterative reconstruction technique. COMPARISON:  Head CT today 0519 hours. FINDINGS: CTA NECK Skeleton: Absent dentition. Multilevel cervical spine disc and endplate degeneration. No acute osseous abnormality identified. Upper chest: At least moderate bilateral emphysema. Negative visible superior mediastinum except for highly abnormal aorta. Other neck: No acute finding. Aortic arch: Severe aortic arch atherosclerosis with multifocal ulcerated soft plaque, appearance suspicious for developing intramural hematoma/dissection. See series 7, image 171 and series 8, image 93. Underlying calcified plaque. Three vessel arch configuration. Right carotid system: Brachiocephalic artery plaque without stenosis. Mildly tortuous right CCA origin and proximal CCA. Patent right carotid bifurcation with comparatively mild soft plaque. No significant right ICA stenosis to the skull base. Left carotid system: Left CCA origin soft plaque with less than 50 % stenosis with respect to the distal vessel. Left carotid bifurcation, left ICA origin soft and calcified plaque with less than 50 % stenosis with respect to the distal vessel. No significant stenosis to the skull base. Vertebral arteries: Proximal right subclavian artery soft plaque with mild ulceration (series 5, image 320) but no stenosis. Right vertebral artery origin appears to remain normal. Right  vertebral has a mildly late entry into the cervical transverse foramen and is patent to the skull base with no significant stenosis. Proximal left subclavian artery soft plaque with no significant stenosis. Left vertebral artery origin appears to remain normal. Fairly codominant left vertebral artery is patent to the skull base with no significant stenosis. CTA HEAD Posterior circulation: Bulky left V4 segment calcified plaque with severe stenosis series 5, image 165. This is proximal to the left PICA and left vertebrobasilar junctions which do remain patent. Contralateral right V4 severe plaque and stenosis also on image 166. Downstream right PICA and vertebrobasilar junction remain patent. Patent basilar artery with mild irregularity, no significant basilar stenosis. Patent basilar tip. Patent SCA and PCA origins. Left posterior communicating artery is present. Bilateral PCA branches are patent with mild tortuosity. There is mild left PCA irregularity on the left. No significant PCA stenosis. Anterior circulation: Both ICA siphons are patent. But there is moderate left cavernous and severe right supraclinoid calcified plaque with moderate to severe left supraclinoid stenosis (series 5, image 130). Contralateral right siphon similar bulky calcified plaque. Mild to moderate right cavernous and supraclinoid segment stenosis (also on image 130). Both carotid termini remain patent. MCA and ACA origins are  normal. Left posterior communicating artery origin is normal. Mildly tortuous A1 segments. Diminutive or absent anterior communicating artery. Bilateral ACA branches are within normal limits. Small distal right ICA infundibulum series 9, image 119 (normal variant). Right MCA M1 segment and bifurcation are patent without stenosis. Right MCA branches are within normal limits. Left MCA M1 segment is mildly tortuous, patent without stenosis. Patent left MCA trifurcation. Left MCA branches appear within normal limits. No  CTA spot sign at the left lentiform hemorrhage (series 10, image 18). No abnormal vascularity there. Venous sinuses: Early contrast timing, grossly patent. Anatomic variants: None significant. Review of the MIP images confirms the above findings IMPRESSION: 1. Negative for large vessel occlusion. No CTA spot sign or vascular malformation at the left lentiform hemorrhage. 2. But positive for highly Abnormal Aortic Arch: Bulky atherosclerotic soft plaque with multiple ulcerations, appearance suspicious for developing intramural hematoma, impending dissection (series 8, image 93). Aortic Atherosclerosis (ICD10-I70.0). 3. 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 - Severe Left ICA supraclinoid stenosis. - Moderate supraclinoid Right ICA stenosis. 4. Emphysema (ICD10-J43.9). Study discussed by telephone with Dr. Erick Blinks on 09/06/2023 at 05:44 . Electronically Signed   By: Odessa Fleming M.D.   On: 09/06/2023 05:44   CT HEAD CODE STROKE WO CONTRAST  Result Date: 09/06/2023 CLINICAL DATA:  Code stroke. 72 year old male neurologic deficit (not otherwise specified at the time of this report). EXAM: CT HEAD WITHOUT CONTRAST TECHNIQUE: Contiguous axial images were obtained from the base of the skull through the vertex without intravenous contrast. RADIATION DOSE REDUCTION: This exam was performed according to the departmental dose-optimization program which includes automated exposure control, adjustment of the mA and/or kV according to patient size and/or use of iterative reconstruction technique. COMPARISON:  Brain MRI 12/24/2019.  Head CT also that day. FINDINGS: Brain: Hyperdense hemorrhage left hemisphere is intra-axial, rounded and centered at the left lentiform. Mildly lobulated hyperdense blood products there encompass 24 x 22 by 27 mm (AP by transverse by CC) for an estimated  intra-axial blood volume 7 mL. Mild surrounding edema. But no significant intracranial mass effect at this time. No midline shift. No IVH or extra-axial blood. Progressed and age indeterminate hypodense small-vessel ischemia in the contralateral right caudate nucleus since 2021. Stable gray-white differentiation elsewhere. No superimposed acute cortically based infarct identified. Normal basilar cisterns. Vascular: Calcified atherosclerosis at the skull base. No suspicious intracranial vascular hyperdensity. Skull: Stable and intact. Sinuses/Orbits: Mild paranasal sinus mucosal thickening. Other: No acute orbit or scalp soft tissue finding, postoperative changes to both globes since 2021. ASPECTS [facility] Early CT Score) Total score (0-10 with 10 being normal): Not applicable, acute hemorrhage. IMPRESSION: 1. Positive for Acute 7 mL hemorrhage in the left lentiform nuclei. Mild surrounding edema but no significant mass effect. No IVH, extra-axial blood, or other complicating features. 2. Progressed since 2021 and age indeterminate small-vessel ischemia in the contralateral right caudate nucleus. No superimposed acute cortically based infarct identified. These results were communicated to Dr. Derry Lory at 5:24 am on 09/06/2023 by text page via the [facility] messaging system. Electronically Signed   By: Odessa Fleming M.D.   On: 09/06/2023 05:25    Labs:  Basic Metabolic Panel: Recent Labs  Lab 09/28/23 0448 09/29/23 0501 10/01/23 0627 10/04/23 0535  NA 150* 146* 143 147*  K 4.1 4.2 3.8 3.8  CL 118* 114* 113* 115*  CO2 25 25 24  22  GLUCOSE 158* 189* 162* 138*  BUN 34* 35* 29* 27*  CREATININE 2.00* 1.76* 1.65* 1.75*  CALCIUM 9.3 8.9 9.0 9.2    CBC:    Latest Ref Rng & Units 10/04/2023    5:35 AM 09/27/2023    7:20 AM 09/25/2023    5:09 AM  CBC  WBC 4.0 - 10.5 K/uL 5.2  7.6  7.3   Hemoglobin 13.0 - 17.0 g/dL 25.3  66.4  40.3   Hematocrit 39.0 - 52.0 % 41.5  40.7  39.8   Platelets 150 -  400 K/uL 208  230  249      CBG: No results for input(s): "GLUCAP" in the last 168 hours.  Brief HPI:   Kyle [name] is a 72 y.o. male    Hospital Course: Kyle [name] was admitted to rehab 09/10/2023 for inpatient therapies to consist of PT, ST and OT at least three hours five days a week. Past admission physiatrist, therapy team and rehab RN have worked together to provide customized collaborative inpatient rehab.   Blood pressures were monitored on TID basis and   Diabetes has been monitored with ac/hs CBG checks and SSI was use prn for tighter BS control.    Rehab course: During patient's stay in rehab weekly team conferences were held to monitor patient's progress, set goals and discuss barriers to discharge. At admission, patient required  He  has had improvement in activity tolerance, balance, postural control as well as ability to compensate for deficits. He has had improvement in functional use RUE  and RLE as well as improvement in awareness       Disposition:  There are no questions and answers to display.         Diet:  Special Instructions:  Discharge Instructions     Ambulatory referral to Neurology   Complete by: As directed    Follow up with Dr. Pearlean Brownie at [facility] in 4-6 weeks. Too complicated for RN to follow. Thanks.   Ambulatory referral to Physical Medicine Rehab   Complete by: As directed    Hospital follow up      Allergies as of 10/04/2023   No Known Allergies   Med Rec must be completed prior to using this [facility]***       Follow-up Information     Victorino Sparrow, MD Follow up.   Specialty: Vascular Surgery Why: 2-3 weeks. The office will call you with your appointment Contact information: [address] [name] [address] [phone]         Micki Riley, MD. Schedule an appointment as soon as possible for a visit in 1 month(s).   Specialties: Neurology, Radiology Why: stroke clinic Contact information: [address] [name] [address] [phone]         Levin Erp, MD Follow up.   Specialty: Family Medicine Why: Call in 1-2 days for post hospital follow up Contact information: [address] [name] [address] [phone]         Erick Colace, MD Follow up.   Specialty: Physical Medicine and Rehabilitation Why: office will call you with follow up appointment Contact information: [address] [name] [address] [phone]                 Signed: Jacquelynn Cree 10/04/2023, 6:15 PM

## 2023-10-05 DIAGNOSIS — R1310 Dysphagia, unspecified: Secondary | ICD-10-CM

## 2023-10-05 DIAGNOSIS — L8990 Pressure ulcer of unspecified site, unspecified stage: Secondary | ICD-10-CM | POA: Insufficient documentation

## 2023-10-05 DIAGNOSIS — M25551 Pain in right hip: Secondary | ICD-10-CM | POA: Insufficient documentation

## 2023-10-05 LAB — BASIC METABOLIC PANEL
Anion gap: 9 (ref 5–15)
BUN: 28 mg/dL — ABNORMAL HIGH (ref 8–23)
CO2: 22 mmol/L (ref 22–32)
Calcium: 9.3 mg/dL (ref 8.9–10.3)
Chloride: 114 mmol/L — ABNORMAL HIGH (ref 98–111)
Creatinine, Ser: 1.75 mg/dL — ABNORMAL HIGH (ref 0.61–1.24)
GFR, Estimated: 41 mL/min — ABNORMAL LOW (ref >60)
Glucose, Bld: 143 mg/dL — ABNORMAL HIGH (ref 70–99)
Potassium: 3.7 mmol/L (ref 3.5–5.1)
Sodium: 145 mmol/L (ref 135–145)

## 2023-10-05 MED ORDER — DICLOFENAC SODIUM 1 % EX GEL: 2.0000 g | Freq: Four times a day (QID) | CUTANEOUS | Status: DC

## 2023-10-05 MED ORDER — MIRTAZAPINE 7.5 MG PO TABS: 7.5000 mg | ORAL_TABLET | Freq: Every day | ORAL | Status: DC

## 2023-10-05 MED ORDER — ALBUTEROL SULFATE (2.5 MG/3ML) 0.083% IN NEBU: 3.0000 mL | INHALATION_SOLUTION | Freq: Four times a day (QID) | RESPIRATORY_TRACT | Status: DC | PRN

## 2023-10-05 MED ORDER — SENNOSIDES-DOCUSATE SODIUM 8.6-50 MG PO TABS: 3.0000 | ORAL_TABLET | Freq: Two times a day (BID) | ORAL | Status: DC

## 2023-10-05 MED ORDER — GABAPENTIN 100 MG PO CAPS: 100.0000 mg | ORAL_CAPSULE | Freq: Every day | ORAL | Status: DC

## 2023-10-05 MED ORDER — PANTOPRAZOLE SODIUM 40 MG PO TBEC: 40.0000 mg | DELAYED_RELEASE_TABLET | Freq: Every day | ORAL | Status: DC

## 2023-10-05 MED ORDER — BISACODYL 5 MG PO TBEC: 5.0000 mg | DELAYED_RELEASE_TABLET | Freq: Every day | ORAL | Status: DC

## 2023-10-05 NOTE — Progress Notes (Addendum)
Occupational Therapy Session Note  Patient Details  Name: Kyle [name] MRN: [id number] Date of Birth: 1951/09/04  Today's Date: 10/05/2023 OT Co-Treatment Time: 1100-1115 OT Co-Treatment Time Calculation (min): 15 min (co treat w/c evaluation with PT 1100-1130)   Short Term Goals: Week 1:  OT Short Term Goal 1 (Week 1): The pt will complete UB bathing and dressing with MinA consistently at 95% safe OT Short Term Goal 1 - Progress (Week 1): Progressing toward goal OT Short Term Goal 2 (Week 1): The pt will complete LB bathing a dressing using AE at Mod A at 95% safe OT Short Term Goal 2 - Progress (Week 1): Progressing toward goal OT Short Term Goal 3 (Week 1): The pt will transfer to all surfaces with Mod A using the sliding board at 95% safe OT Short Term Goal 3 - Progress (Week 1): Progressing toward goal OT Short Term Goal 4 (Week 1): The pt will maintain good sit balance  consistently at 95% safe OT Short Term Goal 4 - Progress (Week 1): Progressing toward goal OT Short Term Goal 5 (Week 1): The pt will tolerate > 45 Minutes of therapy with minimal restbreaks at 95% safe. OT Short Term Goal 5 - Progress (Week 1): Progressing toward goal Week 2:  OT Short Term Goal 1 (Week 2): pt will demonstrate improved sitting balance to sit upright without back support consistently for at least 10 minutes at a time to enable him to engage in sefl care tasks at EOB. OT Short Term Goal 1 - Progress (Week 2): Progressing toward goal OT Short Term Goal 2 (Week 2): Pt will don shirt with mod A demonstrating improved attention and awareness of RUE. OT Short Term Goal 2 - Progress (Week 2): Progressing toward goal OT Short Term Goal 3 (Week 2): Pt will complete squat pivots w/c >< bed or mat to L and to R consistently with max A of 1 to progress to completing toilet transfers in a safe manner (pt currently requires a stedy lift) OT Short Term Goal 3 - Progress (Week 2): Progressing toward goal OT Short  Term Goal 4 (Week 2): Pt will demonstrate improved awareness of RUE by initiating reaching for R arm with L arm to prepare for bed mobility. OT Short Term Goal 4 - Progress (Week 2): Progressing toward goal Week 3:  OT Short Term Goal 1 (Week 3): STGs = LTGs  Skilled Therapeutic Interventions/Progress Updates:    Pt seen for a co treat wheelchair evaluation with PT to assess for a custom w/c for pt to use at home. Pt and his wife participated in the evaluation. Pt taken from room to ortho gym for evaluation. Discussed pt's current physical needs and made seating recommendations. Pt will be discharging to a SNF but will need a custom chair once he is discharged to home.  PT continued evaluation with pt until noon and took pt back to his room.   Therapy Documentation Precautions:  Precautions Precautions: Fall Precaution Comments: R hemiplegia, slight "pusher", SBP <140 Restrictions Weight Bearing Restrictions: No    Vital Signs: Therapy Vitals Temp: 97.8 F (36.6 C) Temp Source: Oral Pulse Rate: 67 BP: (!) 151/69 Oxygen Therapy SpO2: 98 % Pain:  No c/o pain    Therapy/Group: Individual Therapy  Kyle [name] 10/05/2023, 8:30 AM

## 2023-10-05 NOTE — Progress Notes (Signed)
Occupational Therapy Session Note  Patient Details  Name: Kyle [name] MRN: [id number] Date of Birth: Jul 02, 1951  Today's Date: 10/05/2023 OT Individual Time: 1405-1505 OT Individual Time Calculation (min): 60 min    Short Term Goals: Week 3:  OT Short Term Goal 1 (Week 3): STGs = LTGs  Skilled Therapeutic Interventions/Progress Updates:  Pt greeted supine in bed, pt agreeable to OT intervention.      Transfers/bed mobility/functional mobility: pt completed supine>sit with MODA+2 for safety, pt needs assist to maneuver RLE to EOB but needs extremity to be moved slowly d/t pain. Pt completed squat pivots during session to both sides with pt completing squat pivot to stronger L side with MIN A +2 for safety only! Pt continues to require MODA for squat pivots to weaker R side, however pt improving with body mechanics and head/hips relationship during transfer.   Worked on sit>Stands from [facility] with an emphasis on improving independence with stands during ADLs. Provided pt with hemi walker on L side as pt requesting for something to "grab." Heavily worked on anterior leans while ascending into standing as pt wanting to push up to L side only, mirror provided for visual feedback. Pt completed multiple reps with pt needing up to MAX A +2 for safety but mostly MODA +2 for safety with MAX cues for initiation and technique. Once in standing, worked on weight shifts to R<>L side as precursor to higher level functional mobility.     ADLs:   Footwear: donned shoes/AFO from EOB with total A.  General:  Donned sling to RUE during session, pt on 4L [place] with VSS.    Ended session with pt supine in bed with all needs within reach and bed alarm activated.                    Therapy Documentation Precautions:  Precautions Precautions: Fall Precaution Comments: R hemiplegia, slight "pusher", SBP <140 Restrictions Weight Bearing Restrictions: No  Pain: No pain reported    Therapy/Group:  Individual Therapy  Barron Schmid 10/05/2023, 3:18 PM

## 2023-10-05 NOTE — Progress Notes (Addendum)
Patient ID: Kyle [name], male   DOB: 1951/10/19, 72 y.o.   MRN: [id number]  SW received call from [facility]. Patient has received authorization approval to transition to SNF.   SW reached out to [facility] to discuss day of transfer. SW anticipating transfer tomorrow or Thursday. Sw will confirm once confirmed with facility.   SW called spouse via telephone to inform her. Spouse pleased and will wait to receive FU from SW.    2:59 PM: SW received notification from AD facility will be ready for patient tomorrow. SW will update spouse.   Spouse aware and plans to be present about 8 AM.    AD cell phone:[phone]

## 2023-10-05 NOTE — Progress Notes (Signed)
Speech Language Pathology Daily Session Note  Patient Details  Name: Kyle [name] MRN: [id number] Date of Birth: 30-Oct-1950  Today's Date: 10/05/2023 SLP Individual Time: 0802-0859 SLP Individual Time Calculation (min): 57 min  Short Term Goals: Week 4: SLP Short Term Goal 1 (Week 4): STG=LTG due to ELOS  Skilled Therapeutic Interventions: Skilled therapy session focused on dysphagia and communication goals. Upon entrance, patient with thick secretions in oral cavity, likely denture cream. SLP cleared secretions via suction and completed oral care. SLP offered breakfast tray consisting of D1/NTL textures. Patient with 2x immediate cough after NTL which may be indicative of aspiration. SLP observed minimal oral residuals and occasional anterior spillage on R. Recommend continuation of current diet and trials of D2 textures. Continue full supervision to aid in use of compensatory strategies and check for oral clearance. SLP continued to target dysphagia through providing modi cues during completion of pharyngeal strengthing exercises. Patient recalled 3 of 4 speech intelligibility strategies and used with supervisionA. Patient approximately 80% intelligible  given functional phrases such as "I need to go to the bathroom" and "Please call my wife." Patient left in bed with alarm set and call bell in reach. Continue POC.  Pain Denies  Therapy/Group: Individual Therapy  Hersey Maclellan M.A., CF-SLP 10/05/2023, 7:45 AM

## 2023-10-05 NOTE — Progress Notes (Signed)
Physical Therapy Session Note  Patient Details  Name: Kyle [name] MRN: [id number] Date of Birth: [birth year]  Today's Date: 10/05/2023 PT Individual Time: 0920-0945 PT Individual Time Calculation (min): 25 min   Short Term Goals: Week 3:  PT Short Term Goal 1 (Week 3): Pt will roll side to side w/ mod A. PT Short Term Goal 2 (Week 3): Pt will perform supine<>to sit w/ mod A. PT Short Term Goal 3 (Week 3): Pt will perform squat pivot transfer w/ mod A. PT Short Term Goal 4 (Week 3): Pt will ambulate 30 ft using LRAD with maxA. PT Short Term Goal 5 (Week 3): Pt will propel w/c using hemitechnique and MinA  Skilled Therapeutic Interventions/Progress Updates: Patient supine in bed on entrance to room. Patient alert and agreeable to PT session.   Patient reported no pain at beginning of PT session. Pt with IV line on L UE and 4L O2 on entrance to room.  Pt encouraged to sit in TIS to be ready for next PT session with pt slightly apprehensive, but willing to do so.   Therapeutic Activity: Bed Mobility: Pt performed supine<>sit on EOB with maxA. Pt required increased time to initiate required movement prior to VC's being given to assess pt's level of assistance with cue recall. Pt continues to require increase VC to perform log-roll, and to advance B LE's off of bed. Pt cued to use L UE to push off of bed surface (pt used edge of bed to elevate trunk with maxA).  Transfers: Pt performed sliding board transfer to the L with modA (totalA for setup). Provided VC for placement of B LE's as to avoid awkward positioning, and VC to use far side of arm rest to assist is pulling self across after using sliding board with hand. Pt then required min/modA to posteriorly scoot in TIS.   Patient sitting in TIS at end of session with brakes locked, bed alarm set, and all needs within reach.      Therapy Documentation Precautions:  Precautions Precautions: Fall Precaution Comments: R hemiplegia, slight  "pusher", SBP <140 Restrictions Weight Bearing Restrictions: No  Therapy/Group: Individual Therapy  Savvy Peeters PTA 10/05/2023, 12:43 PM

## 2023-10-05 NOTE — Progress Notes (Signed)
PROGRESS NOTE   Subjective/Complaints:  No issues overnite , still drinking fluid poorly , does not like thickened liq   ROS: as per HPI. Denies CP, SOB, abd pain, N/V/D/C, or any other complaints at this time.    Objective:   No results found. Recent Labs    10/04/23 0535  WBC 5.2  HGB 13.1  HCT 41.5  PLT 208     Recent Labs    10/04/23 0535 10/05/23 0606  NA 147* 145  K 3.8 3.7  CL 115* 114*  CO2 22 22  GLUCOSE 138* 143*  BUN 27* 28*  CREATININE 1.75* 1.75*  CALCIUM 9.2 9.3     Intake/Output Summary (Last 24 hours) at 10/05/2023 0802 Last data filed at 10/04/2023 1800 Gross per 24 hour  Intake 354 ml  Output --  Net 354 ml         Physical Exam: Vital Signs Blood pressure (!) 151/69, pulse 67, temperature 97.8 F (36.6 C), temperature source Oral, resp. rate 16, height 5\' 11"  (1.803 m), weight 69.1 kg, SpO2 98%.  General: No acute distress Mood and affect are appropriate Heart: Regular rate and rhythm no rubs murmurs or extra sounds Lungs: Clear to auscultation, breathing unlabored, no rales or wheezes Abdomen: Positive bowel sounds, soft nontender to palpation, nondistended Extremities: No clubbing, cyanosis, Skin: No evidence of breakdown, no evidence of rash Mild dorsal hand edema  No edema in LE Neuro:  pt is alert and oriented. Follows basic commands. Fair insight and awareness. Speech with ongoing  dysarthria. Right central 7 remains. Antigravity and against resistance in left upper and lower extremities; flaccid right upper and lower extremity Flaccid tone -  RUE 0/5, RLE --trace HF/E and 0/5 KE/ADF/APF. MSK- no pain with R finger wrist or elbow ROM , 1.5FB sublux R shoulder , pain with PROM right shoulder esp with ext rotation, stable 12/9  Assessment/Plan: 1. Functional deficits which require 3+ hours per day of interdisciplinary therapy in a comprehensive inpatient rehab  setting. Physiatrist is providing close team supervision and 24 hour management of active medical problems listed below. Physiatrist and rehab team continue to assess barriers to discharge/monitor patient progress toward functional and medical goals  Care Tool:  Bathing    Body parts bathed by patient: Right arm, Chest, Abdomen, Front perineal area, Right upper leg, Left upper leg, Face   Body parts bathed by helper: Right lower leg, Left lower leg, Buttocks, Front perineal area, Left arm     Bathing assist Assist Level: Maximal Assistance - Patient 24 - 49%     Upper Body Dressing/Undressing Upper body dressing   What is the patient wearing?: Pull over shirt    Upper body assist Assist Level: Moderate Assistance - Patient 50 - 74%    Lower Body Dressing/Undressing Lower body dressing      What is the patient wearing?: Pants     Lower body assist Assist for lower body dressing: Maximal Assistance - Patient 25 - 49%     Toileting Toileting    Toileting assist Assist for toileting: Total Assistance - Patient < 25%     Transfers Chair/bed transfer  Transfers assist  Chair/bed transfer assist level: Maximal Assistance - Patient 25 - 49% (squat pivot)     Locomotion Ambulation   Ambulation assist   Ambulation activity did not occur: Safety/medical concerns  Assist level: Maximal Assistance - Patient 25 - 49% Assistive device: Other (comment) (hallway HR) Max distance: 30 ft   Walk 10 feet activity   Assist  Walk 10 feet activity did not occur: Safety/medical concerns  Assist level: Maximal Assistance - Patient 25 - 49% Assistive device: Other (comment) (hallway HR)   Walk 50 feet activity   Assist Walk 50 feet with 2 turns activity did not occur: Safety/medical concerns         Walk 150 feet activity   Assist Walk 150 feet activity did not occur: Safety/medical concerns         Walk 10 feet on uneven surface  activity   Assist  Walk 10 feet on uneven surfaces activity did not occur: Safety/medical concerns         Wheelchair     Assist Is the patient using a wheelchair?: Yes Type of Wheelchair: Manual Wheelchair activity did not occur: Safety/medical concerns  Wheelchair assist level: Moderate Assistance - Patient 50 - 74% Max wheelchair distance: 50 ft    Wheelchair 50 feet with 2 turns activity    Assist        Assist Level: Moderate Assistance - Patient 50 - 74%   Wheelchair 150 feet activity     Assist      Assist Level: Dependent - Patient 0%   Blood pressure (!) 151/69, pulse 67, temperature 97.8 F (36.6 C), temperature source Oral, resp. rate 16, height 5\' 11"  (1.803 m), weight 69.1 kg, SpO2 98%.  Medical Problem List and Plan: 1. Functional deficits secondary to left basal ganglia ICH d/t hypertension/eliquis on 09/05/2023             -patient may shower             -ELOS/Goals: 10/07/23 goals min assist with PT, OT, SLP             - resting WHO and PRAFO    -Continue CIR therapies including PT, OT, and SLP      Rec SNF, team conf in am  2.  Antithrombotics: -DVT/anticoagulation:Eliquis ( for A fib)  3. Pain Management: Tylenol prn. Voltaren gel QID.              -continue low dose gabapentin 100mg  at bedtime--dysesthetic pain improved Hemiplegic shoulder pain , may benefit from Zynex Estim device Cont scheduled acetaminophen  May need Right arm sling for transfers  4. Mood/Behavior/Sleep: LCSW to follow for evaluation and support.              -antipsychotic agents: N/A 5. Neuropsych/cognition: This patient is capable of making decisions on his own behalf. 6. Skin/Wound Care: Routine pressure relief measures. 7. Fluids/Electrolytes/Nutrition:               --added low salt restrictions. Add protein supplement    Intake of meals averaging ~25-50% Recorded intake of fluids variable 116mL-600mL       Latest Ref Rng & Units 10/05/2023    6:06 AM 10/04/2023    5:35  AM 10/01/2023    6:27 AM  BMP  Glucose 70 - 99 mg/dL 147  829  562   BUN 8 - 23 mg/dL 28  27  29    Creatinine 0.61 - 1.24 mg/dL 1.30  8.65  7.84  Sodium 135 - 145 mmol/L 145  147  143   Potassium 3.5 - 5.1 mmol/L 3.7  3.8  3.8   Chloride 98 - 111 mmol/L 114  115  113   CO2 22 - 32 mmol/L 22  22  24    Calcium 8.9 - 10.3 mg/dL 9.3  9.2  9.0      8. B1YN insulin dependent: Hgb A1c-6.2 on 09/06/23             -diet controlled -CBG's not being checked   9. PAD s/o aortobifemoral bypass w/right pseudoaneurysm: SBP goal < 140.  --Plans for repair early December by Dr. Sherral Hammers? 10. COPD w/chronic bronchitis/emphysema: currently on 6 L oxygen. Titrate to keep sats > 90% per Dr. Vassie Loll.              --on Trelegy PTA --started on Breo/Incruse today.              --pulmonary toilet. Weaning down to baseline as able.  11. CAF: Monitor HR TID. On coreg 6.25mg  BID. Off eliquis due to ICH- per Neuro may resume on 11/25 if stable --Neurology recommends resuming  Eliquis in 2 weeks if neuro and BP stable.     Vitals:   10/04/23 2001 10/05/23 0544  BP: 135/69 (!) 151/69  Pulse: 79 67  Resp: 16   Temp: 98.3 F (36.8 C) 97.8 F (36.6 C)  SpO2: 92% 98%   Sys BP mildly elevated due to IVF  12. Dilated cardiomyopathy/AS/chronic systolic CHF: Low salt diet. Check weight daily--up from 166-->173? --CXR 11/11 w/vascular congestion-->will order follow up CXR to check for fluid overload as cause of hypoxia.  --Monitor for signs of overload. On Lasix 20mg  daily, coreg 6.25mg  BID, Imdur 30mg  daily, hydralazine 100mg  TID -LVEF 55-60% -Weights stable , will d/c lasix due to elevated BUN/Creat   Filed Weights   10/02/23 0522 10/03/23 0500 10/04/23 0442  Weight: 68.3 kg 69 kg 69.1 kg    13. CKD: stage IIIb? -11/17 BUN/Cr stable at 33/1.82, 11/19 baseline creat likely in 1.6-1.8 range based on labs in system  Off lasix since 11/21 Still having poor fluid intake po     Latest Ref Rng & Units 10/05/2023     6:06 AM 10/04/2023    5:35 AM 10/01/2023    6:27 AM  BMP  Glucose 70 - 99 mg/dL 829  562  130   BUN 8 - 23 mg/dL 28  27  29    Creatinine 0.61 - 1.24 mg/dL 8.65  7.84  6.96   Sodium 135 - 145 mmol/L 145  147  143   Potassium 3.5 - 5.1 mmol/L 3.7  3.8  3.8   Chloride 98 - 111 mmol/L 114  115  113   CO2 22 - 32 mmol/L 22  22  24    Calcium 8.9 - 10.3 mg/dL 9.3  9.2  9.0   Give IVF 1 L today D5 .2NS-12/10 Na+ down to 145, d/c IVF after current bag and monitor    14. Dysphagia: On D1, nectar- likely contributing to poor intake of liquids               -encourage fluids, aspiration precautions   -see above  15. HTN: continue amlodipine 10mg  daily, coreg 6.25mg  BID, clonidine 0.2mg  q8h, hydralazine 100mg  TID  BP reasonable off furosemide    Vitals:   10/01/23 2131 10/02/23 0436 10/02/23 0623 10/02/23 1352  BP: 138/63 (!) 154/71 (!) 168/72 134/73   10/02/23 1925 10/03/23  0538 10/03/23 1400 10/03/23 1950  BP: (!) 143/58 133/70 130/77 132/71   10/04/23 0336 10/04/23 1428 10/04/23 2001 10/05/23 0544  BP: 137/69 (!) 102/58 135/69 (!) 151/69  Some lability but overall improving  16. Hx of seizure: continue keppra 500mg  BID- f/u neuro as OP   17. HLD: continue rosuvastatin 10mg  QHS  18.  Right hip pain no sig OA on Xray  19. Constipation:  Continue senokot S to 2 tabs BID since miralax might be difficult to achieve nectar thick; add dulcolax daily     LOS: 25 days A FACE TO FACE EVALUATION WAS PERFORMED  Erick Colace 10/05/2023, 8:02 AM

## 2023-10-05 NOTE — Progress Notes (Signed)
Physical Therapy Session Note  Patient Details  Name: Kyle [name] MRN: [id number] Date of Birth: 1951-07-06  Today's Date: 10/05/2023 PT Individual Time: 1131-1200 PT Individual Time Calculation (min): 29 min  and Today's Date: 10/05/2023 PT Co-Treatment Time: 1116-1130 PT Co-Treatment Time Calculation (min): 14 min (co treat w/c evaluation with OT 1100-1130)   Short Term Goals: Week 2:  PT Short Term Goal 1 (Week 2): Pt will roll side to side w/ mod A. PT Short Term Goal 1 - Progress (Week 2): Progressing toward goal PT Short Term Goal 2 (Week 2): Pt will transfer sup to sit w/ mod A. PT Short Term Goal 2 - Progress (Week 2): Progressing toward goal PT Short Term Goal 3 (Week 2): Pt will perform squat pivot transfer w/ mod A. PT Short Term Goal 3 - Progress (Week 2): Progressing toward goal PT Short Term Goal 4 (Week 2): Pt will ambulate 30 ft using LRAD with maxA. PT Short Term Goal 4 - Progress (Week 2): Progressing toward goal PT Short Term Goal 5 (Week 2): Pt will initiate w/c mobility. PT Short Term Goal 5 - Progress (Week 2): Met Week 3:  PT Short Term Goal 1 (Week 3): Pt will roll side to side w/ mod A. PT Short Term Goal 2 (Week 3): Pt will perform supine<>to sit w/ mod A. PT Short Term Goal 3 (Week 3): Pt will perform squat pivot transfer w/ mod A. PT Short Term Goal 4 (Week 3): Pt will ambulate 30 ft using LRAD with maxA. PT Short Term Goal 5 (Week 3): Pt will propel w/c using hemitechnique and MinA   Skilled Therapeutic Interventions/Progress Updates:  Patient seated upright in TIS w/c on entrance to room. Patient alert and agreeable to PT session.   Patient with pain complaint at coccyx and R hip at start of session.  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 High potential  Will require a custom ultra lightweight chair to achieve independence with mobility.    No current wounds, but has extreme discomfort at R hip and  "tailbone".  Harrie Jeans, [facility] and [employer] OT present for custom manual wheelchair evaluation.    Therapists, patient, and [facility] discussed the following necessities for pt's custom wheelchair:  - 18in wide x 20in depth wheelchair - decision to select an ultra-lightweight Liberty tilt-in-space wheelchair in order to allow a lighter weight frame (as compared to traditional TIS) to decrease burden of care on family while also allowing pt more independence with propulsion.  - Need for padded 1/2 lap tray for RUE to allow support of the extremity to improve joint alignment while also allow pt opportunities to utilize that UE for NMR - standard lap seat belt to provide pelvic stability as pt tends to scoot forward to manage coccyx and hip pain. Pt has also been seen to slide forward in w/c during propulsion placing him at risk for falling - Ki Axiom SPV cushion with gel for pressure relief due to pt having significant pain along coccyx - Jay3 deep 6" contoured back for lateral trunk support due to impaired sitting balance   Pt in agreement with the above recommendations. ATP planning to provide loaner wheelchair prior to pt return home. Potential for pt to complete further rehab in SNF prior to d/c when [facility] will follow back up with patient/ family.   Squat pivot back to bed with ModA to pt maneuvering to L. ModA for return to  supine and MaxA +2 using bed features to move toward HOB.   Patient supine in bed at end of session with brakes locked, bed alarm set, and all needs within reach.   Therapy Documentation Precautions:  Precautions Precautions: Fall Precaution Comments: R hemiplegia, slight "pusher", SBP <140 Restrictions Weight Bearing Restrictions: No  Pain:  Coccyx ad hip pain improves with repositioning throughout session.   Therapy/Group: Individual Therapy  Loel Dubonnet PT, DPT, CSRS 10/05/2023, 6:19 PM

## 2023-10-06 DIAGNOSIS — R509 Fever, unspecified: Secondary | ICD-10-CM | POA: Diagnosis not present

## 2023-10-06 DIAGNOSIS — J449 Chronic obstructive pulmonary disease, unspecified: Secondary | ICD-10-CM | POA: Diagnosis not present

## 2023-10-06 DIAGNOSIS — J9621 Acute and chronic respiratory failure with hypoxia: Secondary | ICD-10-CM | POA: Diagnosis not present

## 2023-10-06 DIAGNOSIS — U071 COVID-19: Secondary | ICD-10-CM | POA: Diagnosis not present

## 2023-10-06 DIAGNOSIS — R41841 Cognitive communication deficit: Secondary | ICD-10-CM | POA: Diagnosis not present

## 2023-10-06 DIAGNOSIS — Z95820 Peripheral vascular angioplasty status with implants and grafts: Secondary | ICD-10-CM | POA: Diagnosis not present

## 2023-10-06 DIAGNOSIS — N3289 Other specified disorders of bladder: Secondary | ICD-10-CM | POA: Diagnosis not present

## 2023-10-06 DIAGNOSIS — G8110 Spastic hemiplegia affecting unspecified side: Secondary | ICD-10-CM | POA: Diagnosis not present

## 2023-10-06 DIAGNOSIS — I10 Essential (primary) hypertension: Secondary | ICD-10-CM | POA: Diagnosis not present

## 2023-10-06 DIAGNOSIS — R0602 Shortness of breath: Secondary | ICD-10-CM | POA: Diagnosis not present

## 2023-10-06 DIAGNOSIS — J9601 Acute respiratory failure with hypoxia: Secondary | ICD-10-CM | POA: Diagnosis not present

## 2023-10-06 DIAGNOSIS — I619 Nontraumatic intracerebral hemorrhage, unspecified: Secondary | ICD-10-CM | POA: Diagnosis not present

## 2023-10-06 DIAGNOSIS — E119 Type 2 diabetes mellitus without complications: Secondary | ICD-10-CM | POA: Diagnosis not present

## 2023-10-06 DIAGNOSIS — L89896 Pressure-induced deep tissue damage of other site: Secondary | ICD-10-CM | POA: Diagnosis not present

## 2023-10-06 DIAGNOSIS — Z7901 Long term (current) use of anticoagulants: Secondary | ICD-10-CM | POA: Diagnosis not present

## 2023-10-06 DIAGNOSIS — L97921 Non-pressure chronic ulcer of unspecified part of left lower leg limited to breakdown of skin: Secondary | ICD-10-CM | POA: Diagnosis not present

## 2023-10-06 DIAGNOSIS — L98491 Non-pressure chronic ulcer of skin of other sites limited to breakdown of skin: Secondary | ICD-10-CM | POA: Diagnosis not present

## 2023-10-06 DIAGNOSIS — Z7401 Bed confinement status: Secondary | ICD-10-CM | POA: Diagnosis not present

## 2023-10-06 DIAGNOSIS — I610 Nontraumatic intracerebral hemorrhage in hemisphere, subcortical: Secondary | ICD-10-CM | POA: Diagnosis not present

## 2023-10-06 DIAGNOSIS — R0902 Hypoxemia: Secondary | ICD-10-CM | POA: Diagnosis not present

## 2023-10-06 DIAGNOSIS — L97911 Non-pressure chronic ulcer of unspecified part of right lower leg limited to breakdown of skin: Secondary | ICD-10-CM | POA: Diagnosis not present

## 2023-10-06 DIAGNOSIS — R262 Difficulty in walking, not elsewhere classified: Secondary | ICD-10-CM | POA: Diagnosis not present

## 2023-10-06 DIAGNOSIS — R531 Weakness: Secondary | ICD-10-CM | POA: Diagnosis not present

## 2023-10-06 DIAGNOSIS — I5021 Acute systolic (congestive) heart failure: Secondary | ICD-10-CM | POA: Diagnosis not present

## 2023-10-06 DIAGNOSIS — I4891 Unspecified atrial fibrillation: Secondary | ICD-10-CM | POA: Diagnosis not present

## 2023-10-06 DIAGNOSIS — E1122 Type 2 diabetes mellitus with diabetic chronic kidney disease: Secondary | ICD-10-CM | POA: Diagnosis not present

## 2023-10-06 DIAGNOSIS — J440 Chronic obstructive pulmonary disease with acute lower respiratory infection: Secondary | ICD-10-CM | POA: Diagnosis not present

## 2023-10-06 DIAGNOSIS — L97318 Non-pressure chronic ulcer of right ankle with other specified severity: Secondary | ICD-10-CM | POA: Diagnosis not present

## 2023-10-06 DIAGNOSIS — K5900 Constipation, unspecified: Secondary | ICD-10-CM | POA: Diagnosis not present

## 2023-10-06 DIAGNOSIS — J441 Chronic obstructive pulmonary disease with (acute) exacerbation: Secondary | ICD-10-CM | POA: Diagnosis not present

## 2023-10-06 DIAGNOSIS — R0689 Other abnormalities of breathing: Secondary | ICD-10-CM | POA: Diagnosis not present

## 2023-10-06 DIAGNOSIS — L97519 Non-pressure chronic ulcer of other part of right foot with unspecified severity: Secondary | ICD-10-CM | POA: Diagnosis not present

## 2023-10-06 DIAGNOSIS — E785 Hyperlipidemia, unspecified: Secondary | ICD-10-CM | POA: Diagnosis not present

## 2023-10-06 DIAGNOSIS — M25551 Pain in right hip: Secondary | ICD-10-CM | POA: Diagnosis not present

## 2023-10-06 DIAGNOSIS — J9611 Chronic respiratory failure with hypoxia: Secondary | ICD-10-CM | POA: Diagnosis not present

## 2023-10-06 DIAGNOSIS — I5030 Unspecified diastolic (congestive) heart failure: Secondary | ICD-10-CM | POA: Diagnosis not present

## 2023-10-06 DIAGNOSIS — L97129 Non-pressure chronic ulcer of left thigh with unspecified severity: Secondary | ICD-10-CM | POA: Diagnosis not present

## 2023-10-06 DIAGNOSIS — Z79899 Other long term (current) drug therapy: Secondary | ICD-10-CM | POA: Diagnosis not present

## 2023-10-06 DIAGNOSIS — Z87891 Personal history of nicotine dependence: Secondary | ICD-10-CM | POA: Diagnosis not present

## 2023-10-06 DIAGNOSIS — I724 Aneurysm of artery of lower extremity: Secondary | ICD-10-CM | POA: Diagnosis not present

## 2023-10-06 DIAGNOSIS — M6281 Muscle weakness (generalized): Secondary | ICD-10-CM | POA: Diagnosis not present

## 2023-10-06 DIAGNOSIS — I130 Hypertensive heart and chronic kidney disease with heart failure and stage 1 through stage 4 chronic kidney disease, or unspecified chronic kidney disease: Secondary | ICD-10-CM | POA: Diagnosis not present

## 2023-10-06 DIAGNOSIS — L97418 Non-pressure chronic ulcer of right heel and midfoot with other specified severity: Secondary | ICD-10-CM | POA: Diagnosis not present

## 2023-10-06 DIAGNOSIS — I110 Hypertensive heart disease with heart failure: Secondary | ICD-10-CM | POA: Diagnosis not present

## 2023-10-06 DIAGNOSIS — E870 Hyperosmolality and hypernatremia: Secondary | ICD-10-CM | POA: Diagnosis not present

## 2023-10-06 DIAGNOSIS — J9811 Atelectasis: Secondary | ICD-10-CM | POA: Diagnosis not present

## 2023-10-06 DIAGNOSIS — F32A Depression, unspecified: Secondary | ICD-10-CM | POA: Diagnosis not present

## 2023-10-06 DIAGNOSIS — I482 Chronic atrial fibrillation, unspecified: Secondary | ICD-10-CM | POA: Diagnosis not present

## 2023-10-06 DIAGNOSIS — L89526 Pressure-induced deep tissue damage of left ankle: Secondary | ICD-10-CM | POA: Diagnosis not present

## 2023-10-06 DIAGNOSIS — Z794 Long term (current) use of insulin: Secondary | ICD-10-CM | POA: Diagnosis not present

## 2023-10-06 DIAGNOSIS — G8190 Hemiplegia, unspecified affecting unspecified side: Secondary | ICD-10-CM | POA: Diagnosis not present

## 2023-10-06 DIAGNOSIS — N183 Chronic kidney disease, stage 3 unspecified: Secondary | ICD-10-CM | POA: Diagnosis not present

## 2023-10-06 DIAGNOSIS — Z9889 Other specified postprocedural states: Secondary | ICD-10-CM | POA: Diagnosis not present

## 2023-10-06 DIAGNOSIS — G40901 Epilepsy, unspecified, not intractable, with status epilepticus: Secondary | ICD-10-CM | POA: Diagnosis not present

## 2023-10-06 DIAGNOSIS — Z95828 Presence of other vascular implants and grafts: Secondary | ICD-10-CM | POA: Diagnosis not present

## 2023-10-06 DIAGNOSIS — Z8673 Personal history of transient ischemic attack (TIA), and cerebral infarction without residual deficits: Secondary | ICD-10-CM | POA: Diagnosis not present

## 2023-10-06 NOTE — Progress Notes (Signed)
Inpatient Rehabilitation Care Coordinator Discharge Note   Patient Details  Name: Kyle [name] MRN: [id number] Date of Birth: 03/08/1951   Discharge location: SNF ([facility])  Length of Stay: 26 Days  Discharge activity level: Mod/Max  Home/community participation: Spouse  Patient response UY:QIHKVQ Literacy - How often do you need to have someone help you when you read instructions, pamphlets, or other written material from your doctor or pharmacy?: Always  Patient response QV:ZDGLOV Isolation - How often do you feel lonely or isolated from those around you?: Never  Services provided included: SW, Neuropsych, Pharmacy, TR, CM, RN, OT, SLP, PT, RD, MD  Financial Services:  Financial Services Utilized: Private Insurance [employer]  Choices offered to/list presented to: Pt and Spouse  Follow-up services arranged:  Other (Comment) (SNF FU)           Patient response to transportation need: Is the patient able to respond to transportation needs?: Yes In the past 12 months, has lack of transportation kept you from medical appointments or from getting medications?: No In the past 12 months, has lack of transportation kept you from meetings, work, or from getting things needed for daily living?: No   Patient/Family verbalized understanding of follow-up arrangements:  Yes  Individual responsible for coordination of the follow-up plan: spouse  Confirmed correct DME delivered: Andria Rhein 10/06/2023    Comments (or additional information):  Summary of Stay    Date/Time Discharge Planning CSW  10/05/23 1615 Discharging to SNF tomorrow CJB  09/28/23 1425 Discharging home with spouse to assist primarily. Family education started.  Barrier: 15 steps to enter apartment. CJB  09/21/23 1436 Discharging home with spouse to primarily assist. Children to assist PRN on weekends. Barrier: Patient lives in 2nd level apartment. 15 steps to enter. CJB  09/14/23 1549  Discharging home with appuse and PRN fromm children on weekends. 2nd level apartment 13 steps to enter. CJB       Andria Rhein

## 2023-10-06 NOTE — Plan of Care (Signed)
  Problem: RH Swallowing Goal: LTG Patient will consume least restrictive diet using compensatory strategies with assistance (SLP) Description: LTG:  Patient will consume least restrictive diet using compensatory strategies with assistance (SLP) Outcome: Completed/Met Goal: LTG Pt will demonstrate functional change in swallow as evidenced by bedside/clinical objective assessment (SLP) Description: LTG: Patient will demonstrate functional change in swallow as evidenced by bedside/clinical objective assessment (SLP) Outcome: Completed/Met   Problem: RH Cognition - SLP Goal: RH LTG Patient will demonstrate orientation with cues Description:  LTG:  Patient will demonstrate orientation to person/place/time/situation with cues (SLP)   Outcome: Completed/Met   Problem: RH Expression Communication Goal: LTG Patient will increase speech intelligibility (SLP) Description: LTG: Patient will increase speech intelligibility at word/phrase/conversation level with cues, % of the time (SLP) Outcome: Completed/Met

## 2023-10-06 NOTE — Progress Notes (Signed)
Speech Language Pathology Discharge Summary  Patient Details  Name: Kyle [name] MRN: [id number] Date of Birth: 31-Aug-1951  Date of Discharge from SLP service:October 06, 2023  Patient has met 4 of 4 long term goals.  Patient to discharge at overall Supervision;Min level.  Reasons goals not met: n/a   Clinical Impression/Discharge Summary:  Pt has made great gains and has met 4 of 4 LTG's this admission due to improved temporal orientation, dysphagia and dysarthria. Pt is currently an overall supervision-minA for temporal orientation and requires supervision cues for utilization of swallowing compensatory strategies to minimize overt s/sx of aspiration with D1/NTL diet. Recommend continuation of D2 trials at next level of care. Patient is approximately 70% intelligible at the phrase level given minA. Pt/family education complete and pt will discharge to SNF. Pt would benefit from SNF f/u ST services to maximize dysarthria/dysphagia in order to maximize functional independence.   Care Partner:  Caregiver Able to Provide Assistance: Yes  Type of Caregiver Assistance: Physical;Cognitive  Recommendation:  24 hour supervision/assistance;Skilled Nursing facility  Rationale for SLP Follow Up: Maximize swallowing safety;Maximize functional communication   Equipment: n/a   Reasons for discharge: Discharged from hospital;Treatment goals met   Patient/Family Agrees with Progress Made and Goals Achieved: Yes    Inna Tisdell M.A., CF-SLP 10/06/2023, 7:43 AM

## 2023-10-06 NOTE — Progress Notes (Addendum)
Occupational Therapy Discharge Summary  Patient Details  Name: Kyle [name] MRN: [id number] Date of Birth: [birth year]  Date of Discharge from OT service:October 06, 2023   Patient has met 8 of 13 long term goals due to improved activity tolerance, improved balance, postural control, and improved awareness.  Patient to discharge at [facility] Max Assist level.  Patient's care partner unavailable to provide the necessary physical assistance at discharge.  Family education provided but wife is unable to physically assist pt at his max A level.  Reasons goals not met: pt did not meet min A bathing goal as he needs mod A,  he also continues to need mod A with UB dressing vs min A,  and he requires total A with toileting vs max A. He also continues to require max A with dynamic sit balance vs min A.  Recommendation:  Patient will benefit from ongoing skilled OT services in skilled nursing facility setting to continue to advance functional skills in the area of BADL and Reduce care partner burden.  Equipment: Resting hand splint for night use and wrist cock up splint for day use  Reasons for discharge: treatment goals met  Patient/family agrees with progress made and goals achieved: Yes  OT Discharge Precautions/Restrictions  Precautions Precautions: Fall Precaution Comments: R hemiplegia iwth flaccid UE>LE Required Braces or Orthoses:  (has ordered arm sling) Restrictions Weight Bearing Restrictions: No     ADL ADL Eating: supervision assistance Where Assessed-Eating: w/c Grooming: Setup Where Assessed-Grooming: w/c Where Assessed-Upper Body Bathing: Edge of bed Lower Body Bathing: Moderate assistance Where Assessed-Lower Body Bathing: Edge of bed Upper Body Dressing: Moderate assistance Where Assessed-Upper Body Dressing: Edge of bed Lower Body Dressing: Maximal assistance Where Assessed-Lower Body Dressing: Edge of bed Toileting: Dependent Where Assessed-Toileting: Bedside  Commode (BSC over toilet) Toilet Transfer: Maximal assistance Toilet Transfer Method: Other (comment)  Vision Baseline Vision/History: 0 No visual deficits Patient Visual Report: No change from baseline Vision Assessment?: No apparent visual deficits Perception  Perception: Impaired Perception-Other Comments: significant improvement from eval but continues to leave R side behind with no protection to UE Praxis Praxis: Impaired Praxis Impairment Details: Motor planning Praxis-Other Comments: Continues to demo difficulty with use of LLE to rise to stand Cognition Cognition Overall Cognitive Status: Impaired/Different from baseline Arousal/Alertness: Awake/alert Memory: Appears intact Awareness: Impaired Problem Solving: Appears intact Executive Function: Sequencing;Self Correcting;Self Monitoring Sequencing: Impaired Self Monitoring: Impaired Self Correcting: Impaired Safety/Judgment: Impaired Comments: continues to be slightly impulsive with reduced awareness of deficits Brief Interview for Mental Status (BIMS) Repetition of Three Words (First Attempt): 3 Temporal Orientation: Year: Correct Temporal Orientation: Month: Accurate within 5 days Temporal Orientation: Day: Correct Recall: "Sock": Yes, no cue required Recall: "Blue": Yes, after cueing ("a color") Recall: "Bed": Yes, after cueing ("a piece of furniture") BIMS Summary Score: 13 Sensation Sensation Light Touch: Appears Intact Hot/Cold: Appears Intact Proprioception: Impaired by gross assessment Stereognosis: Impaired by gross assessment Coordination Gross Motor Movements are Fluid and Coordinated: No Fine Motor Movements are Fluid and Coordinated: No Heel Shin Test: unable RLE Motor  Motor Motor: Hemiplegia;Motor impersistence Motor - Discharge Observations: dense R hemiplegia Mobility  Bed Mobility Bed Mobility: Rolling Right;Rolling Left;Right Sidelying to Sit;Left Sidelying to Sit;Sit to Sidelying  Right;Sit to Sidelying Left Rolling Right: Supervision/verbal cueing Rolling Left: Maximal Assistance - Patient 25-49% Right Sidelying to Sit: Moderate Assistance - Patient 50-74% Left Sidelying to Sit: Minimal Assistance - Patient >75% Sit to Sidelying Right: Moderate Assistance - Patient 50-74% Sit to Sidelying  Left: Moderate Assistance - Patient 50-74% Transfers Sit to Stand: Moderate Assistance - Patient 50-74%;Maximal Assistance - Patient 25-49% (only with steady hand hold) Stand to Sit: Minimal Assistance - Patient > 75%;Moderate Assistance - Patient 50-74%  Trunk/Postural Assessment  Postural Control Righting Reactions: limited and delayed Postural Limitations: improved trunk control with decreased R lean in sitting, continues to have a lean in standing requiring mod A.  Balance Static Sitting Balance Static Sitting - Level of Assistance: 4: Min assist Dynamic Sitting Balance Dynamic Sitting - Level of Assistance: 2: Max assist Extremity/Trunk Assessment RUE Assessment Active Range of Motion (AROM) Comments: no AROM General Strength Comments: flaccid with subluxation, developing trace scapular elevation LUE Assessment LUE Assessment: Within Functional Limits   Tanazia Achee 10/06/2023, 10:51 AM

## 2023-10-06 NOTE — Progress Notes (Signed)
Patient ID: Kyle [name], male   DOB: 03/12/1951, 72 y.o.   MRN: [id number]  Sw received FU from [facility]. Facility requesting transport at 12 PM.

## 2023-10-06 NOTE — Progress Notes (Signed)
Patient ID: Kyle [name], male   DOB: 07/16/1951, 72 y.o.   MRN: [id number]   Transport arranged for 12 PM.

## 2023-10-06 NOTE — Progress Notes (Signed)
Inpatient Rehabilitation Discharge Medication Review by a Pharmacist  A complete drug regimen review was completed for this patient to identify any potential clinically significant medication issues.  High Risk Drug Classes Is patient taking? Indication by Medication  Antipsychotic No   Anticoagulant Yes Apixaban - atrial fibrillation  Antibiotic No   Opioid No   Antiplatelet No   Hypoglycemics/insulin No   Vasoactive Medication Yes Amlodipine, Carvedilol, Clonidine, Hydralazine, Isosorbide mononitrate  - hypertension  Chemotherapy No   Other Yes Diclofenac gel (R hand) - topical pain relief Gabapentin - neuropathic pain Levetiracetam - seizure prophylaxis Mirtazapine - appetite stimulation Pantoprazole - GERD Rosuvastatin - hyperlipidemia Multivitamin - supplement Trelegy - COPD Bisacodyl, senna-docusate - laxatives  PRNs: Acetaminophen - mild pain Albuterol nebs - wheezing or shortness of breath     Type of Medication Issue Identified Description of Issue Recommendation(s)  Drug Interaction(s) (clinically significant)     Duplicate Therapy     Allergy     No Medication Administration End Date     Incorrect Dose     Additional Drug Therapy Needed     Significant med changes from prior encounter (inform family/care partners about these prior to discharge). Furosemide discontinued. New: clonidine, gabapentin, levetiracetam, mirtazapine, pantoprazole, diclfofenac gel, laxatives. Dose changes: amlodipine, hydralazine. Communicate changes with patient/family prior to discharge.  Other  Breo + Incruse inhalers while inpatient. Previously on SSI, discontinued 11/21 as not needing. Back to Trelegy at discharge. Diet-controlled glucose.    Clinically significant medication issues were identified that warrant physician communication and completion of prescribed/recommended actions by midnight of the next day:  No  Pharmacist comments: -  Eliquis reversed on 11/11 with  KCentra. Enoxaparin prophylaxis begun 11/16 per Neuro clearance. Anticoagulation resumed with IV heparin on 11/27 > transitioned back to Eliquis on 09/24/23.  Time spent performing this drug regimen review (minutes):  20   Dennie Fetters, [place] 10/06/2023 7:40 AM

## 2023-10-06 NOTE — Progress Notes (Signed)
Physical Therapy Discharge Summary  Patient Details  Name: Kyle [name] MRN: [id number] Date of Birth: [birth year]  Date of Discharge from PT service:{Time; dates multiple:304500300}  {CHL IP REHAB PT TIME CALCULATION:304800500}   Patient has met {NUMBERS 0-12:18577} of {NUMBERS 0-12:18577} long term goals due to {due WN:0272536}.  Patient to discharge at [facility] level {LOA:3049010}.   Patient's care partner {care partner:3041650} to provide the necessary {assistance:3041652} assistance at discharge.  Reasons goals not met: ***  Recommendation:  Patient will benefit from ongoing skilled PT services in {setting:3041680} to continue to advance safe functional mobility, address ongoing impairments in ***, and minimize fall risk.  Equipment: {equipment:3041657}  Reasons for discharge: {Reason for discharge:3049018}  Patient/family agrees with progress made and goals achieved: {Pt/Family agree with progress/goals:3049020}  PT Discharge Precautions/Restrictions Restrictions Weight Bearing Restrictions: No Vital Signs   Pain   Pain Interference   Vision/Perception     Cognition Orientation Level: Oriented to person;Oriented to place;Oriented to situation Sensation Sensation Light Touch: Appears Intact Hot/Cold: Appears Intact Proprioception: Impaired by gross assessment Stereognosis: Impaired by gross assessment Motor     Mobility   Locomotion  Pick up small object from the floor (from standing position) activity did not occur: Safety/medical concerns  Trunk/Postural Assessment  Thoracic Assessment Thoracic Assessment: Exceptions to [facility] (rounded shoulders) Lumbar Assessment Lumbar Assessment: Exceptions to [facility] (posterior pelvic tilt, uncomfortable in sacral sitting.) Postural Control Postural Control: Deficits on evaluation Righting Reactions: limited and delayed Postural Limitations: improved trunk control with decreased R lean in sitting, continues to  have a lean in standing requiring mod A.  Balance Balance Balance Assessed: Yes Standardized Balance Assessment Standardized Balance Assessment: PASS Postural Assessment Scale for Stroke Patients=PASS 1. Sitting Without Support: Can sit for more than 10 seconds without support 2. Standing With Support: Can stand with strong support of 2 people 3. Standing Without Support: Cannot stand without support 4.Standing on Nonparetic Leg: Can stand on nonparetic leg for a few seconds 5.Standing on Paretic Leg: Cannot stand on paretic leg MAINTAINING POSTURE SUBTOTAL: 4 6. Supine to Paretic Side Lateral: Can perform with little help 7. Supine to Nonparetic Side Lateral: Can perform with much help 8. Supine to Sitting Up on the Edge of the Mat: Can perform with little help 9. Sitting on the Edge of the Mat to Supine: Can perform with much help 10. Sitting to Standing Up: Can perform with little help 11. Standing Up to Sitting Down: Can perform with little help 12. Standing,Picking Up a Pencil from the Floor: Cannot perform CHANGING POSTURE SUBTOTAL: 10 PASS TOTAL SCORE: 14 Static Sitting Balance Static Sitting - Balance Support: Feet supported;Left upper extremity supported Static Sitting - Level of Assistance: Other (comment);4: Min assist (CGA) Dynamic Sitting Balance Dynamic Sitting - Balance Support: Feet supported;Left upper extremity supported Dynamic Sitting - Level of Assistance: 2: Max Oncologist Standing - Balance Support: Left upper extremity supported Static Standing - Level of Assistance: 1: +1 Total assist;2: Max assist Static Standing - Comment/# of Minutes: unable to utilize R hemibody Dynamic Standing Balance Dynamic Standing - Balance Support: Left upper extremity supported Dynamic Standing - Level of Assistance: 1: +2 Total assist Extremity Assessment  RUE Assessment Active Range of Motion (AROM) Comments: no AROM General Strength Comments:  flaccid with subluxation, developing trace scapular elevation LUE Assessment LUE Assessment: Within Functional Limits RLE Assessment RLE Assessment: Exceptions to [facility] RLE Strength RLE Overall Strength: Deficits Right Hip Flexion: 1/5 Right Hip Extension: 1/5 Right Hip  ABduction: 1/5 Right Hip ADduction: 2-/5 Right Knee Flexion: 0/5 Right Knee Extension: 0/5 Right Ankle Dorsiflexion: 0/5 Right Ankle Plantar Flexion: 0/5 LLE Assessment LLE Assessment: Within Functional Limits General Strength Comments: grossly 5/5 with reduced coordination   Loel Dubonnet 10/06/2023, 1:24 PM

## 2023-10-06 NOTE — Plan of Care (Signed)
  Problem: RH Balance Goal: LTG: Patient will maintain dynamic sitting balance (OT) Description: LTG:  Patient will maintain dynamic sitting balance with assistance during activities of daily living (OT) Outcome: Adequate for Discharge   Problem: RH Bathing Goal: LTG Patient will bathe all body parts with assist levels (OT) Description: LTG: Patient will bathe all body parts with assist levels (OT) Outcome: Adequate for Discharge   Problem: RH Dressing Goal: LTG Patient will perform upper body dressing (OT) Description: LTG Patient will perform upper body dressing with assist, with/without cues (OT). Outcome: Adequate for Discharge   Problem: RH Dressing Goal: LTG Patient will perform upper body dressing (OT) Description: LTG Patient will perform upper body dressing with assist, with/without cues (OT). Outcome: Adequate for Discharge   Problem: RH Toileting Goal: LTG Patient will perform toileting task (3/3 steps) with assistance level (OT) Description: LTG: Patient will perform toileting task (3/3 steps) with assistance level (OT)  Outcome: Adequate for Discharge   Problem: RH Balance Goal: LTG Patient will maintain dynamic standing with ADLs (OT) Description: LTG:  Patient will maintain dynamic standing balance with assist during activities of daily living (OT)  Outcome: Completed/Met   Problem: Sit to Stand Goal: LTG:  Patient will perform sit to stand in prep for activites of daily living with assistance level (OT) Description: LTG:  Patient will perform sit to stand in prep for activites of daily living with assistance level (OT) Outcome: Completed/Met   Problem: RH Eating Goal: LTG Patient will perform eating w/assist, cues/equip (OT) Description: LTG: Patient will perform eating with assist, with/without cues using equipment (OT) Outcome: Completed/Met   Problem: RH Grooming Goal: LTG Patient will perform grooming w/assist,cues/equip (OT) Description: LTG: Patient  will perform grooming with assist, with/without cues using equipment (OT) Outcome: Completed/Met   Problem: RH Dressing Goal: LTG Patient will perform lower body dressing w/assist (OT) Description: LTG: Patient will perform lower body dressing with assist, with/without cues in positioning using equipment (OT) Outcome: Completed/Met   Problem: RH Functional Use of Upper Extremity Goal: LTG Patient will use RT/LT upper extremity as a (OT) Description: LTG: Patient will use right/left upper extremity as a stabilizer/gross assist/diminished/nondominant/dominant level with assist, with/without cues during functional activity (OT) Outcome: Completed/Met   Problem: RH Toilet Transfers Goal: LTG Patient will perform toilet transfers w/assist (OT) Description: LTG: Patient will perform toilet transfers with assist, with/without cues using equipment (OT) Outcome: Completed/Met   Problem: RH Memory Goal: LTG Patient will demonstrate ability for day to day recall/carry over during activities of daily living with assistance level (OT) Description: LTG:  Patient will demonstrate ability for day to day recall/carry over during activities of daily living with assistance level (OT). Outcome: Completed/Met

## 2023-10-06 NOTE — Progress Notes (Signed)
PROGRESS NOTE   Subjective/Complaints:  No issues overnite , discussed PMR f/u Going to [facility]  ROS: as per HPI. Denies CP, SOB, abd pain, N/V/D/C, or any other complaints at this time.    Objective:   No results found. Recent Labs    10/04/23 0535  WBC 5.2  HGB 13.1  HCT 41.5  PLT 208     Recent Labs    10/04/23 0535 10/05/23 0606  NA 147* 145  K 3.8 3.7  CL 115* 114*  CO2 22 22  GLUCOSE 138* 143*  BUN 27* 28*  CREATININE 1.75* 1.75*  CALCIUM 9.2 9.3     Intake/Output Summary (Last 24 hours) at 10/06/2023 0900 Last data filed at 10/06/2023 0809 Gross per 24 hour  Intake 478 ml  Output 550 ml  Net -72 ml         Physical Exam: Vital Signs Blood pressure (!) 148/69, pulse 80, temperature 98.5 F (36.9 C), resp. rate 16, height 5\' 11"  (1.803 m), weight 67.7 kg, SpO2 93%.  General: No acute distress Mood and affect are appropriate Heart: Regular rate and rhythm no rubs murmurs or extra sounds Lungs: Clear to auscultation, breathing unlabored, no rales or wheezes Abdomen: Positive bowel sounds, soft nontender to palpation, nondistended Extremities: No clubbing, cyanosis, Skin: No evidence of breakdown, no evidence of rash Mild dorsal hand edema  No edema in LE Neuro:  pt is alert and oriented. Follows basic commands. Fair insight and awareness. Speech with ongoing  dysarthria. Right central 7 remains. Antigravity and against resistance in left upper and lower extremities; flaccid right upper and lower extremity Flaccid tone -  RUE 0/5, RLE --trace HF/E and 0/5 KE/ADF/APF. MSK- no pain with R finger wrist or elbow ROM , 1.5FB sublux R shoulder  Assessment/Plan: 1. 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 Stable for D/C to SNF today  F/u PM&R 3-4 weeks See D/C summary See D/C instructions   Care Tool:  Bathing    Body parts bathed by patient: Right arm, Chest, Abdomen, Front perineal  area, Right upper leg, Left upper leg, Face   Body parts bathed by helper: Right lower leg, Left lower leg, Buttocks, Front perineal area, Left arm     Bathing assist Assist Level: Maximal Assistance - Patient 24 - 49%     Upper Body Dressing/Undressing Upper body dressing   What is the patient wearing?: Pull over shirt    Upper body assist Assist Level: Moderate Assistance - Patient 50 - 74%    Lower Body Dressing/Undressing Lower body dressing      What is the patient wearing?: Pants     Lower body assist Assist for lower body dressing: Maximal Assistance - Patient 25 - 49%     Toileting Toileting    Toileting assist Assist for toileting: Total Assistance - Patient < 25%     Transfers Chair/bed transfer  Transfers assist     Chair/bed transfer assist level: Maximal Assistance - Patient 25 - 49% (squat pivot)     Locomotion Ambulation   Ambulation assist   Ambulation activity did not occur: Safety/medical concerns  Assist level: Maximal Assistance - Patient 25 - 49%  Assistive device: Other (comment) (hallway HR) Max distance: 30 ft   Walk 10 feet activity   Assist  Walk 10 feet activity did not occur: Safety/medical concerns  Assist level: Maximal Assistance - Patient 25 - 49% Assistive device: Other (comment) (hallway HR)   Walk 50 feet activity   Assist Walk 50 feet with 2 turns activity did not occur: Safety/medical concerns         Walk 150 feet activity   Assist Walk 150 feet activity did not occur: Safety/medical concerns         Walk 10 feet on uneven surface  activity   Assist Walk 10 feet on uneven surfaces activity did not occur: Safety/medical concerns         Wheelchair     Assist Is the patient using a wheelchair?: Yes Type of Wheelchair: Manual Wheelchair activity did not occur: Safety/medical concerns  Wheelchair assist level: Moderate Assistance - Patient 50 - 74% Max wheelchair distance: 50 ft     Wheelchair 50 feet with 2 turns activity    Assist        Assist Level: Moderate Assistance - Patient 50 - 74%   Wheelchair 150 feet activity     Assist      Assist Level: Dependent - Patient 0%   Blood pressure (!) 148/69, pulse 80, temperature 98.5 F (36.9 C), resp. rate 16, height 5\' 11"  (1.803 m), weight 67.7 kg, SpO2 93%.  Medical Problem List and Plan: 1. Functional deficits secondary to left basal ganglia ICH d/t hypertension/eliquis on 09/05/2023     D/c to SNF today  2.  Antithrombotics: -DVT/anticoagulation:Eliquis ( for A fib)  3. Pain Management: Tylenol prn. Voltaren gel QID.              -continue low dose gabapentin 100mg  at bedtime--dysesthetic pain improved Hemiplegic shoulder pain , may benefit from Zynex Estim device Cont scheduled acetaminophen  May need Right arm sling for transfers  4. Mood/Behavior/Sleep: LCSW to follow for evaluation and support.              -antipsychotic agents: N/A 5. Neuropsych/cognition: This patient is capable of making decisions on his own behalf. 6. Skin/Wound Care: Routine pressure relief measures. 7. Fluids/Electrolytes/Nutrition:               --added low salt restrictions. Add protein supplement    Intake of meals averaging ~25-50% Recorded intake of fluids variable 144mL-600mL       Latest Ref Rng & Units 10/05/2023    6:06 AM 10/04/2023    5:35 AM 10/01/2023    6:27 AM  BMP  Glucose 70 - 99 mg/dL 161  096  045   BUN 8 - 23 mg/dL 28  27  29    Creatinine 0.61 - 1.24 mg/dL 4.09  8.11  9.14   Sodium 135 - 145 mmol/L 145  147  143   Potassium 3.5 - 5.1 mmol/L 3.7  3.8  3.8   Chloride 98 - 111 mmol/L 114  115  113   CO2 22 - 32 mmol/L 22  22  24    Calcium 8.9 - 10.3 mg/dL 9.3  9.2  9.0      8. N8GN insulin dependent: Hgb A1c-6.2 on 09/06/23             -diet controlled -CBG's not being checked   9. PAD s/o aortobifemoral bypass w/right pseudoaneurysm: SBP goal < 140.  --Plans for repair early  December by Dr. Sherral Hammers? 10.  COPD w/chronic bronchitis/emphysema: currently on 6 L oxygen. Titrate to keep sats > 90% per Dr. Vassie Loll.              --on Trelegy PTA --started on Breo/Incruse today.              --pulmonary toilet. Weaning down to baseline as able.  11. CAF: Monitor HR TID. On coreg 6.25mg  BID. Off eliquis due to ICH- per Neuro may resume on 11/25 if stable --Neurology recommends resuming  Eliquis in 2 weeks if neuro and BP stable.     Vitals:   10/06/23 0419 10/06/23 0805  BP: (!) 148/69   Pulse: 79 80  Resp: 16 16  Temp: 98.5 F (36.9 C)   SpO2: 93%    Sys BP mildly elevated due to IVF  12. Dilated cardiomyopathy/AS/chronic systolic CHF: Low salt diet. Check weight daily--up from 166-->173? --CXR 11/11 w/vascular congestion-->will order follow up CXR to check for fluid overload as cause of hypoxia.  --Monitor for signs of overload. On Lasix 20mg  daily, coreg 6.25mg  BID, Imdur 30mg  daily, hydralazine 100mg  TID -LVEF 55-60% -Weights stable , will d/c lasix due to elevated BUN/Creat   Filed Weights   10/04/23 0442 10/05/23 0718 10/06/23 0419  Weight: 69.1 kg 69.2 kg 67.7 kg    13. CKD: stage IIIb? -11/17 BUN/Cr stable at 33/1.82, 11/19 baseline creat likely in 1.6-1.8 range based on labs in system  Off lasix since 11/21 Still having poor fluid intake po     Latest Ref Rng & Units 10/05/2023    6:06 AM 10/04/2023    5:35 AM 10/01/2023    6:27 AM  BMP  Glucose 70 - 99 mg/dL 409  811  914   BUN 8 - 23 mg/dL 28  27  29    Creatinine 0.61 - 1.24 mg/dL 7.82  9.56  2.13   Sodium 135 - 145 mmol/L 145  147  143   Potassium 3.5 - 5.1 mmol/L 3.7  3.8  3.8   Chloride 98 - 111 mmol/L 114  115  113   CO2 22 - 32 mmol/L 22  22  24    Calcium 8.9 - 10.3 mg/dL 9.3  9.2  9.0   D/c IVF   14. Dysphagia: On D1, nectar- likely contributing to poor intake of liquids               -encourage fluids, aspiration precautions   -see above  15. HTN: continue amlodipine 10mg  daily, coreg  6.25mg  BID, clonidine 0.2mg  q8h, hydralazine 100mg  TID  BP reasonable off furosemide    Vitals:   10/02/23 1352 10/02/23 1925 10/03/23 0538 10/03/23 1400  BP: 134/73 (!) 143/58 133/70 130/77   10/03/23 1950 10/04/23 0336 10/04/23 1428 10/04/23 2001  BP: 132/71 137/69 (!) 102/58 135/69   10/05/23 0544 10/05/23 1318 10/05/23 2002 10/06/23 0419  BP: (!) 151/69 (!) 129/55 (!) 142/75 (!) 148/69  Some lability but overall controlled  16. Hx of seizure: continue keppra 500mg  BID- f/u neuro as OP   17. HLD: continue rosuvastatin 10mg  QHS  18.  Right hip pain no sig OA on Xray  19. Constipation:  Continue senokot S to 2 tabs BID since miralax might be difficult to achieve nectar thick; add dulcolax daily     LOS: 26 days A FACE TO FACE EVALUATION WAS PERFORMED  Erick Colace 10/06/2023, 9:00 AM

## 2023-10-07 ENCOUNTER — Encounter: Payer: Medicare HMO | Admitting: Vascular Surgery

## 2023-10-07 DIAGNOSIS — E785 Hyperlipidemia, unspecified: Secondary | ICD-10-CM | POA: Diagnosis not present

## 2023-10-07 DIAGNOSIS — G40901 Epilepsy, unspecified, not intractable, with status epilepticus: Secondary | ICD-10-CM | POA: Diagnosis not present

## 2023-10-07 DIAGNOSIS — M25551 Pain in right hip: Secondary | ICD-10-CM | POA: Diagnosis not present

## 2023-10-07 DIAGNOSIS — F32A Depression, unspecified: Secondary | ICD-10-CM | POA: Diagnosis not present

## 2023-10-07 DIAGNOSIS — J449 Chronic obstructive pulmonary disease, unspecified: Secondary | ICD-10-CM | POA: Diagnosis not present

## 2023-10-07 DIAGNOSIS — I110 Hypertensive heart disease with heart failure: Secondary | ICD-10-CM | POA: Diagnosis not present

## 2023-10-07 DIAGNOSIS — K5900 Constipation, unspecified: Secondary | ICD-10-CM | POA: Diagnosis not present

## 2023-10-07 DIAGNOSIS — I5021 Acute systolic (congestive) heart failure: Secondary | ICD-10-CM | POA: Diagnosis not present

## 2023-10-07 DIAGNOSIS — I619 Nontraumatic intracerebral hemorrhage, unspecified: Secondary | ICD-10-CM | POA: Diagnosis not present

## 2023-10-07 NOTE — Plan of Care (Signed)
Problem: RH Balance Goal: LTG Patient will maintain dynamic sitting balance (PT) Description: LTG:  Patient will maintain dynamic sitting balance with assistance during mobility activities (PT) Outcome: Progressing Flowsheets (Taken 10/07/2023 0822) LTG: Pt will maintain dynamic sitting balance during mobility activities with:: Minimal Assistance - Patient > 75% Note: Pt is CGA for sitting balance with feet supported.   Problem: RH Balance Goal: LTG Patient will maintain dynamic standing balance (PT) Description: LTG:  Patient will maintain dynamic standing balance with assistance during mobility activities (PT) Outcome: Completed/Met Flowsheets (Taken 09/28/2023 0542) LTG: Pt will maintain dynamic standing balance during mobility activities with:: Maximal Assistance - Patient 25 - 49%   Problem: Sit to Stand Goal: LTG:  Patient will perform sit to stand with assistance level (PT) Description: LTG:  Patient will perform sit to stand with assistance level (PT) Outcome: Completed/Met Flowsheets (Taken 10/07/2023 5784) LTG: PT will perform sit to stand in preparation for functional mobility with assistance level:  Moderate Assistance - Patient 50 - 74%  Maximal Assistance - Patient 25 - 49% Note: Can be Mod A with steady hand hold to LUE.   Problem: RH Bed Mobility Goal: LTG Patient will perform bed mobility with assist (PT) Description: LTG: Patient will perform bed mobility with assistance, with/without cues (PT). Outcome: Completed/Met Flowsheets (Taken 09/28/2023 0542) LTG: Pt will perform bed mobility with assistance level of: Moderate Assistance - Patient 50 - 74% Note: Requires consistent vc.    Problem: RH Bed to Chair Transfers Goal: LTG Patient will perform bed/chair transfers w/assist (PT) Description: LTG: Patient will perform bed to chair transfers with assistance (PT). Outcome: Completed/Met Flowsheets (Taken 09/28/2023 0542) LTG: Pt will perform Bed to Chair Transfers  with assistance level: Moderate Assistance - Patient 50 - 74% Note: Squat pivot with ModA and vc for setup and throughout.   Slide board to R with ModA and to L with CGA/ MinA. After placing board with MaxA.    Problem: RH Car Transfers Goal: LTG Patient will perform car transfers with assist (PT) Description: LTG: Patient will perform car transfers with assistance (PT). Outcome: Completed/Met Flowsheets (Taken 10/07/2023 0822) LTG: Pt will perform car transfers with assist:: (using slide board) Moderate Assistance - Patient 50 - 74% Note: Using slide board.   MaxA for squat pivot.    Problem: RH Furniture Transfers Goal: LTG Patient will perform furniture transfers w/assist (OT/PT) Description: LTG: Patient will perform furniture transfers  with assistance (OT/PT). Outcome: Completed/Met Flowsheets (Taken 09/28/2023 0542) LTG: Pt will perform furniture transfers with assist:: Maximal Assistance - Patient 25 - 49% Note: Squat pivot  May be ModA or less with slide board.   Problem: RH Ambulation Goal: LTG Patient will ambulate in controlled environment (PT) Description: LTG: Patient will ambulate in a controlled environment, # of feet with assistance (PT). Outcome: Completed/Met Flowsheets (Taken 09/28/2023 0542) LTG: Pt will ambulate in controlled environ  assist needed:: Maximal Assistance - Patient 25 - 49% LTG: Ambulation distance in controlled environment: 50 ft using LRAD Note: Usually limited to 30 ft using HR, however was MaxA for HW up to 65 ft.    Problem: RH Wheelchair Mobility Goal: LTG Patient will propel w/c in controlled environment (PT) Description: LTG: Patient will propel wheelchair in controlled environment, # of feet with assist (PT) Outcome: Completed/Met Flowsheets Taken 09/28/2023 0542 by Sharol Harness A, PT LTG: Propel w/c distance in controlled environment: 100 ft Taken 09/11/2023 1231 by Lucio Edward, PT LTG: Pt will propel w/c in controlled  environ  assist needed:: Supervision/Verbal cueing Goal: LTG Patient will propel w/c in home environment (PT) Description: LTG: Patient will propel wheelchair in home environment, # of feet with assistance (PT). Outcome: Completed/Met Flowsheets Taken 09/28/2023 0542 by Loel Dubonnet, PT Distance: wheelchair distance in controlled environment: 100 Taken 09/11/2023 1231 by Lucio Edward, PT LTG: Pt will propel w/c in home environ  assist needed:: Supervision/Verbal cueing LTG: Propel w/c distance in home environment: 50

## 2023-10-08 DIAGNOSIS — I619 Nontraumatic intracerebral hemorrhage, unspecified: Secondary | ICD-10-CM | POA: Diagnosis not present

## 2023-10-08 DIAGNOSIS — F32A Depression, unspecified: Secondary | ICD-10-CM | POA: Diagnosis not present

## 2023-10-08 DIAGNOSIS — K5900 Constipation, unspecified: Secondary | ICD-10-CM | POA: Diagnosis not present

## 2023-10-08 DIAGNOSIS — E785 Hyperlipidemia, unspecified: Secondary | ICD-10-CM | POA: Diagnosis not present

## 2023-10-08 DIAGNOSIS — M25551 Pain in right hip: Secondary | ICD-10-CM | POA: Diagnosis not present

## 2023-10-08 DIAGNOSIS — G40901 Epilepsy, unspecified, not intractable, with status epilepticus: Secondary | ICD-10-CM | POA: Diagnosis not present

## 2023-10-08 DIAGNOSIS — I10 Essential (primary) hypertension: Secondary | ICD-10-CM | POA: Diagnosis not present

## 2023-10-08 DIAGNOSIS — J449 Chronic obstructive pulmonary disease, unspecified: Secondary | ICD-10-CM | POA: Diagnosis not present

## 2023-10-08 DIAGNOSIS — I5021 Acute systolic (congestive) heart failure: Secondary | ICD-10-CM | POA: Diagnosis not present

## 2023-10-11 DIAGNOSIS — G40901 Epilepsy, unspecified, not intractable, with status epilepticus: Secondary | ICD-10-CM | POA: Diagnosis not present

## 2023-10-11 DIAGNOSIS — E785 Hyperlipidemia, unspecified: Secondary | ICD-10-CM | POA: Diagnosis not present

## 2023-10-11 DIAGNOSIS — I10 Essential (primary) hypertension: Secondary | ICD-10-CM | POA: Diagnosis not present

## 2023-10-11 DIAGNOSIS — F32A Depression, unspecified: Secondary | ICD-10-CM | POA: Diagnosis not present

## 2023-10-11 DIAGNOSIS — I619 Nontraumatic intracerebral hemorrhage, unspecified: Secondary | ICD-10-CM | POA: Diagnosis not present

## 2023-10-11 DIAGNOSIS — J449 Chronic obstructive pulmonary disease, unspecified: Secondary | ICD-10-CM | POA: Diagnosis not present

## 2023-10-11 DIAGNOSIS — M25551 Pain in right hip: Secondary | ICD-10-CM | POA: Diagnosis not present

## 2023-10-11 DIAGNOSIS — K5900 Constipation, unspecified: Secondary | ICD-10-CM | POA: Diagnosis not present

## 2023-10-11 DIAGNOSIS — I5021 Acute systolic (congestive) heart failure: Secondary | ICD-10-CM | POA: Diagnosis not present

## 2023-10-11 NOTE — Telephone Encounter (Signed)
Results received.   Abstracted by practice however the date is incorrect and should be 04/28/2023

## 2023-10-12 ENCOUNTER — Encounter (HOSPITAL_COMMUNITY): Payer: Self-pay

## 2023-10-12 ENCOUNTER — Telehealth: Payer: Self-pay | Admitting: Neurology

## 2023-10-12 DIAGNOSIS — M25551 Pain in right hip: Secondary | ICD-10-CM | POA: Diagnosis not present

## 2023-10-12 DIAGNOSIS — L89526 Pressure-induced deep tissue damage of left ankle: Secondary | ICD-10-CM | POA: Diagnosis not present

## 2023-10-12 DIAGNOSIS — I5021 Acute systolic (congestive) heart failure: Secondary | ICD-10-CM | POA: Diagnosis not present

## 2023-10-12 DIAGNOSIS — E785 Hyperlipidemia, unspecified: Secondary | ICD-10-CM | POA: Diagnosis not present

## 2023-10-12 DIAGNOSIS — F32A Depression, unspecified: Secondary | ICD-10-CM | POA: Diagnosis not present

## 2023-10-12 DIAGNOSIS — I110 Hypertensive heart disease with heart failure: Secondary | ICD-10-CM | POA: Diagnosis not present

## 2023-10-12 DIAGNOSIS — G40901 Epilepsy, unspecified, not intractable, with status epilepticus: Secondary | ICD-10-CM | POA: Diagnosis not present

## 2023-10-12 DIAGNOSIS — L89896 Pressure-induced deep tissue damage of other site: Secondary | ICD-10-CM | POA: Diagnosis not present

## 2023-10-12 DIAGNOSIS — K5900 Constipation, unspecified: Secondary | ICD-10-CM | POA: Diagnosis not present

## 2023-10-12 DIAGNOSIS — I619 Nontraumatic intracerebral hemorrhage, unspecified: Secondary | ICD-10-CM | POA: Diagnosis not present

## 2023-10-12 DIAGNOSIS — J449 Chronic obstructive pulmonary disease, unspecified: Secondary | ICD-10-CM | POA: Diagnosis not present

## 2023-10-12 NOTE — Telephone Encounter (Signed)
Niecey with transportation called to confirm pt's appt details

## 2023-10-13 ENCOUNTER — Telehealth: Payer: Self-pay

## 2023-10-13 DIAGNOSIS — F32A Depression, unspecified: Secondary | ICD-10-CM | POA: Diagnosis not present

## 2023-10-13 DIAGNOSIS — E785 Hyperlipidemia, unspecified: Secondary | ICD-10-CM | POA: Diagnosis not present

## 2023-10-13 DIAGNOSIS — M25551 Pain in right hip: Secondary | ICD-10-CM | POA: Diagnosis not present

## 2023-10-13 DIAGNOSIS — I619 Nontraumatic intracerebral hemorrhage, unspecified: Secondary | ICD-10-CM | POA: Diagnosis not present

## 2023-10-13 DIAGNOSIS — J449 Chronic obstructive pulmonary disease, unspecified: Secondary | ICD-10-CM | POA: Diagnosis not present

## 2023-10-13 DIAGNOSIS — I5021 Acute systolic (congestive) heart failure: Secondary | ICD-10-CM | POA: Diagnosis not present

## 2023-10-13 DIAGNOSIS — G40901 Epilepsy, unspecified, not intractable, with status epilepticus: Secondary | ICD-10-CM | POA: Diagnosis not present

## 2023-10-13 DIAGNOSIS — I110 Hypertensive heart disease with heart failure: Secondary | ICD-10-CM | POA: Diagnosis not present

## 2023-10-13 DIAGNOSIS — K5900 Constipation, unspecified: Secondary | ICD-10-CM | POA: Diagnosis not present

## 2023-10-13 NOTE — Telephone Encounter (Signed)
Patient's wife LVM on nurse line regarding concerns with patient.   She reports that when he was discharged from the hospital, post stroke, he was taken off his insulin. She is concerned as he is now in rehab and is having elevated blood sugar levels.   Attempted to call patient's wife back, she did not answer. LVM asking that she return call to office.   Veronda Prude, RN

## 2023-10-15 DIAGNOSIS — I5021 Acute systolic (congestive) heart failure: Secondary | ICD-10-CM | POA: Diagnosis not present

## 2023-10-15 DIAGNOSIS — F32A Depression, unspecified: Secondary | ICD-10-CM | POA: Diagnosis not present

## 2023-10-15 DIAGNOSIS — K5900 Constipation, unspecified: Secondary | ICD-10-CM | POA: Diagnosis not present

## 2023-10-15 DIAGNOSIS — I619 Nontraumatic intracerebral hemorrhage, unspecified: Secondary | ICD-10-CM | POA: Diagnosis not present

## 2023-10-15 DIAGNOSIS — E785 Hyperlipidemia, unspecified: Secondary | ICD-10-CM | POA: Diagnosis not present

## 2023-10-15 DIAGNOSIS — M25551 Pain in right hip: Secondary | ICD-10-CM | POA: Diagnosis not present

## 2023-10-15 DIAGNOSIS — I110 Hypertensive heart disease with heart failure: Secondary | ICD-10-CM | POA: Diagnosis not present

## 2023-10-15 DIAGNOSIS — J449 Chronic obstructive pulmonary disease, unspecified: Secondary | ICD-10-CM | POA: Diagnosis not present

## 2023-10-15 DIAGNOSIS — G40901 Epilepsy, unspecified, not intractable, with status epilepticus: Secondary | ICD-10-CM | POA: Diagnosis not present

## 2023-10-16 DIAGNOSIS — J449 Chronic obstructive pulmonary disease, unspecified: Secondary | ICD-10-CM | POA: Diagnosis not present

## 2023-10-16 DIAGNOSIS — E785 Hyperlipidemia, unspecified: Secondary | ICD-10-CM | POA: Diagnosis not present

## 2023-10-16 DIAGNOSIS — I110 Hypertensive heart disease with heart failure: Secondary | ICD-10-CM | POA: Diagnosis not present

## 2023-10-16 DIAGNOSIS — K5900 Constipation, unspecified: Secondary | ICD-10-CM | POA: Diagnosis not present

## 2023-10-16 DIAGNOSIS — I5021 Acute systolic (congestive) heart failure: Secondary | ICD-10-CM | POA: Diagnosis not present

## 2023-10-16 DIAGNOSIS — M25551 Pain in right hip: Secondary | ICD-10-CM | POA: Diagnosis not present

## 2023-10-16 DIAGNOSIS — I619 Nontraumatic intracerebral hemorrhage, unspecified: Secondary | ICD-10-CM | POA: Diagnosis not present

## 2023-10-16 DIAGNOSIS — F32A Depression, unspecified: Secondary | ICD-10-CM | POA: Diagnosis not present

## 2023-10-16 DIAGNOSIS — G40901 Epilepsy, unspecified, not intractable, with status epilepticus: Secondary | ICD-10-CM | POA: Diagnosis not present

## 2023-10-18 DIAGNOSIS — I110 Hypertensive heart disease with heart failure: Secondary | ICD-10-CM | POA: Diagnosis not present

## 2023-10-18 DIAGNOSIS — E785 Hyperlipidemia, unspecified: Secondary | ICD-10-CM | POA: Diagnosis not present

## 2023-10-18 DIAGNOSIS — F32A Depression, unspecified: Secondary | ICD-10-CM | POA: Diagnosis not present

## 2023-10-18 DIAGNOSIS — G40901 Epilepsy, unspecified, not intractable, with status epilepticus: Secondary | ICD-10-CM | POA: Diagnosis not present

## 2023-10-18 DIAGNOSIS — J449 Chronic obstructive pulmonary disease, unspecified: Secondary | ICD-10-CM | POA: Diagnosis not present

## 2023-10-18 DIAGNOSIS — M25551 Pain in right hip: Secondary | ICD-10-CM | POA: Diagnosis not present

## 2023-10-18 DIAGNOSIS — I619 Nontraumatic intracerebral hemorrhage, unspecified: Secondary | ICD-10-CM | POA: Diagnosis not present

## 2023-10-18 DIAGNOSIS — K5900 Constipation, unspecified: Secondary | ICD-10-CM | POA: Diagnosis not present

## 2023-10-18 DIAGNOSIS — I5021 Acute systolic (congestive) heart failure: Secondary | ICD-10-CM | POA: Diagnosis not present

## 2023-10-19 ENCOUNTER — Ambulatory Visit (HOSPITAL_COMMUNITY)
Admission: RE | Admit: 2023-10-19 | Discharge: 2023-10-19 | Disposition: A | Discharge status: Home / Self Care | Payer: Medicare HMO | Source: Ambulatory Visit | Attending: Vascular Surgery | Admitting: Vascular Surgery

## 2023-10-19 ENCOUNTER — Encounter (HOSPITAL_COMMUNITY): Payer: Self-pay

## 2023-10-19 DIAGNOSIS — E785 Hyperlipidemia, unspecified: Secondary | ICD-10-CM | POA: Diagnosis not present

## 2023-10-19 DIAGNOSIS — I724 Aneurysm of artery of lower extremity: Secondary | ICD-10-CM | POA: Insufficient documentation

## 2023-10-19 DIAGNOSIS — G40901 Epilepsy, unspecified, not intractable, with status epilepticus: Secondary | ICD-10-CM | POA: Diagnosis not present

## 2023-10-19 DIAGNOSIS — J9811 Atelectasis: Secondary | ICD-10-CM | POA: Diagnosis not present

## 2023-10-19 DIAGNOSIS — N3289 Other specified disorders of bladder: Secondary | ICD-10-CM | POA: Diagnosis not present

## 2023-10-19 DIAGNOSIS — L89896 Pressure-induced deep tissue damage of other site: Secondary | ICD-10-CM | POA: Diagnosis not present

## 2023-10-19 DIAGNOSIS — L89526 Pressure-induced deep tissue damage of left ankle: Secondary | ICD-10-CM | POA: Diagnosis not present

## 2023-10-19 MED ORDER — SODIUM CHLORIDE (PF) 0.9 % IJ SOLN
INTRAMUSCULAR | Status: AC
  Filled 2023-10-19: qty 50

## 2023-10-19 MED ORDER — IOHEXOL 350 MG/ML SOLN
100.0000 mL | Freq: Once | INTRAVENOUS | Status: AC | PRN
  Administered 2023-10-19: 80 mL via INTRAVENOUS

## 2023-10-21 DIAGNOSIS — G40901 Epilepsy, unspecified, not intractable, with status epilepticus: Secondary | ICD-10-CM | POA: Diagnosis not present

## 2023-10-21 DIAGNOSIS — I619 Nontraumatic intracerebral hemorrhage, unspecified: Secondary | ICD-10-CM | POA: Diagnosis not present

## 2023-10-21 DIAGNOSIS — I5021 Acute systolic (congestive) heart failure: Secondary | ICD-10-CM | POA: Diagnosis not present

## 2023-10-21 DIAGNOSIS — I10 Essential (primary) hypertension: Secondary | ICD-10-CM | POA: Diagnosis not present

## 2023-10-21 DIAGNOSIS — M25551 Pain in right hip: Secondary | ICD-10-CM | POA: Diagnosis not present

## 2023-10-21 DIAGNOSIS — J449 Chronic obstructive pulmonary disease, unspecified: Secondary | ICD-10-CM | POA: Diagnosis not present

## 2023-10-21 DIAGNOSIS — E785 Hyperlipidemia, unspecified: Secondary | ICD-10-CM | POA: Diagnosis not present

## 2023-10-21 DIAGNOSIS — K5900 Constipation, unspecified: Secondary | ICD-10-CM | POA: Diagnosis not present

## 2023-10-21 DIAGNOSIS — F32A Depression, unspecified: Secondary | ICD-10-CM | POA: Diagnosis not present

## 2023-10-23 ENCOUNTER — Other Ambulatory Visit: Payer: Self-pay | Admitting: Student

## 2023-10-25 DIAGNOSIS — J449 Chronic obstructive pulmonary disease, unspecified: Secondary | ICD-10-CM | POA: Diagnosis not present

## 2023-10-25 DIAGNOSIS — I619 Nontraumatic intracerebral hemorrhage, unspecified: Secondary | ICD-10-CM | POA: Diagnosis not present

## 2023-10-25 DIAGNOSIS — M25551 Pain in right hip: Secondary | ICD-10-CM | POA: Diagnosis not present

## 2023-10-25 DIAGNOSIS — G40901 Epilepsy, unspecified, not intractable, with status epilepticus: Secondary | ICD-10-CM | POA: Diagnosis not present

## 2023-10-25 DIAGNOSIS — I110 Hypertensive heart disease with heart failure: Secondary | ICD-10-CM | POA: Diagnosis not present

## 2023-10-25 DIAGNOSIS — I5021 Acute systolic (congestive) heart failure: Secondary | ICD-10-CM | POA: Diagnosis not present

## 2023-10-25 DIAGNOSIS — E785 Hyperlipidemia, unspecified: Secondary | ICD-10-CM | POA: Diagnosis not present

## 2023-10-25 DIAGNOSIS — K5900 Constipation, unspecified: Secondary | ICD-10-CM | POA: Diagnosis not present

## 2023-10-25 DIAGNOSIS — F32A Depression, unspecified: Secondary | ICD-10-CM | POA: Diagnosis not present

## 2023-10-26 DIAGNOSIS — L89526 Pressure-induced deep tissue damage of left ankle: Secondary | ICD-10-CM | POA: Diagnosis not present

## 2023-10-26 DIAGNOSIS — G40901 Epilepsy, unspecified, not intractable, with status epilepticus: Secondary | ICD-10-CM | POA: Diagnosis not present

## 2023-10-26 DIAGNOSIS — L89896 Pressure-induced deep tissue damage of other site: Secondary | ICD-10-CM | POA: Diagnosis not present

## 2023-10-26 DIAGNOSIS — E785 Hyperlipidemia, unspecified: Secondary | ICD-10-CM | POA: Diagnosis not present

## 2023-10-27 NOTE — Progress Notes (Signed)
 .       Office Note   History of Present Illness   Kyle [name] is a 73 y.o. (07/30/1951) male who presents for surveillance with history of aortobifemoral bypass graft in 2016.  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  In 2020 he had a right femoral artery pseudoaneurysm repair with right femoral to peroneal artery bypass with greater saphenous vein.  During surveillance in 2022 he was found to have elevated velocities of 437cm/s at the distal anastomosis of his femoral-peroneal bypass.  This was investigated via angiogram but no significant stenosis was found.  It was suggested if his velocities ever significantly increased he may require repeat angiogram versus surgical revision.  Patient was last seen in the hospital status post stroke with imaging demonstrated small pseudoaneurysm at the previous right femoral artery revision site.  Being that his stroke was large, and that he could not be heparinized, we discussed following this in the outpatient setting as he was asymptomatic, without pain in the right groin.  On exam today, Perle is accompanied by his wife.  He continues to live at a care facility, presenting in a wheelchair with a [employer].  He is working to ambulate again, but is still a heavy assist for standing.  He has severe deficits in the right arm, and right leg.  Denies new onset right groin pain.  Continues to require 4 to 5 L of oxygen  at baseline.  Denies symptoms of ischemic rest pain, tissue loss in the feet  Current Outpatient Medications  Medication Sig Dispense Refill   acetaminophen  (TYLENOL ) 325 MG tablet Take 2 tablets (650 mg total) by mouth every 4 (four) hours as needed for mild pain (pain score 1-3).     albuterol  (PROVENTIL ) (2.5 MG/3ML) 0.083% nebulizer solution Inhale 3 mLs into the lungs every 6 (six) hours as needed for wheezing or shortness of breath.     amLODipine  (NORVASC ) 10 MG  tablet Take 1 tablet (10 mg total) by mouth daily.     amLODipine  (NORVASC ) 5 MG tablet TAKE 1 TABLET EVERY DAY 90 tablet 3   apixaban  (ELIQUIS ) 5 MG TABS tablet Take 5 mg by mouth 2 (two) times daily.     bisacodyl  (DULCOLAX) 5 MG EC tablet Take 1 tablet (5 mg total) by mouth daily.     carvedilol  (COREG ) 6.25 MG tablet TAKE 1 TABLET TWICE DAILY WITH MEALS (NEED MD APPOINTMENT) 180 tablet 3   cloNIDine  (CATAPRES ) 0.2 MG tablet Take 1 tablet (0.2 mg total) by mouth every 8 (eight) hours.     diclofenac  Sodium (VOLTAREN ) 1 % GEL Apply 2 g topically 4 (four) times daily.     Fluticasone -Umeclidin-Vilant (TRELEGY ELLIPTA ) 200-62.5-25 MCG/ACT AEPB Inhale 1 puff into the lungs daily.     gabapentin  (NEURONTIN ) 100 MG capsule Take 1 capsule (100 mg total) by mouth at bedtime.     hydrALAZINE  (APRESOLINE ) 100 MG tablet Take 1 tablet (100 mg total) by mouth 3 (three) times daily.     isosorbide  mononitrate (IMDUR ) 30 MG 24 hr tablet TAKE 1 TABLET BY MOUTH ONCE DAILY. 90 tablet 1   levETIRAcetam  (KEPPRA ) 500 MG tablet Take 1 tablet (500 mg total) by mouth 2 (two) times daily.     mirtazapine  (REMERON ) 7.5 MG tablet Take 1 tablet (7.5 mg total) by mouth at bedtime.     Mouthwashes (MOUTH RINSE) LIQD solution 15 mLs by Mouth Rinse route  4 (four) times daily - after meals and at bedtime.     Multiple Vitamins-Minerals (CENTRUM SILVER  50+MEN) TABS Take 1 tablet by mouth daily with breakfast.     pantoprazole  (PROTONIX ) 40 MG tablet Take 1 tablet (40 mg total) by mouth daily.     rosuvastatin  (CRESTOR ) 10 MG tablet TAKE 1 TABLET AT BEDTIME 90 tablet 3   senna-docusate (SENOKOT-S) 8.6-50 MG tablet Take 3 tablets by mouth 2 (two) times daily.     No current facility-administered medications for this visit.    REVIEW OF SYSTEMS (negative unless checked):   Cardiac:  []  Chest pain or chest pressure? []  Shortness of breath upon activity? []  Shortness of breath when lying flat? []  Irregular heart  rhythm?  Vascular:  []  Pain in calf, thigh, or hip brought on by walking? []  Pain in feet at night that wakes you up from your sleep? []  Blood clot in your veins? []  Leg swelling?  Pulmonary:  []  Oxygen  at home? []  Productive cough? []  Wheezing?  Neurologic:  []  Sudden weakness in arms or legs? []  Sudden numbness in arms or legs? []  Sudden onset of difficult speaking or slurred speech? []  Temporary loss of vision in one eye? []  Problems with dizziness?  Gastrointestinal:  []  Blood in stool? []  Vomited blood?  Genitourinary:  []  Burning when urinating? []  Blood in urine?  Psychiatric:  []  Major depression  Hematologic:  []  Bleeding problems? []  Problems with blood clotting?  Dermatologic:  []  Rashes or ulcers?  Constitutional:  []  Fever or chills?  Ear/Nose/Throat:  []  Change in hearing? []  Nose bleeds? []  Sore throat?  Musculoskeletal:  []  Back pain? []  Joint pain? []  Muscle pain?   Physical Examination   There were no vitals filed for this visit.  There is no height or weight on file to calculate BMI.  General: Severe decline since last seen, bedridden Gait: Not observed HENT: WNL, normocephalic Pulmonary: normal non-labored breathing , without rales, rhonchi,  wheezing Cardiac: regular Abdomen: soft, NT, no masses Skin: without rashes Vascular Exam/Pulses: Multiphasic peroneal signal, multiphasic DP signal Pedal pulse in the right groin, no significant size change on physical exam Extremities: without ischemic changes, without gangrene , without cellulitis; without open wounds;  Musculoskeletal: no muscle wasting or atrophy  Neurologic: Alert and oriented, dysphagia, severe deficits in the right arm and right leg.  Severe neuropathic pain in both. Psychiatric:  The pt has Normal affect.     Medical Decision Making   HORACE LUKAS is a 73 y.o. male who presents after recent hospitalization for stroke with imaging demonstrating incidental  finding of right groin pseudoaneurysm femoral revision site.  On physical exam, the pseudoaneurysm is palpable, nonpainful.  He has excellent signals in his feet distally. CT angiogram demonstrated no change in pseudoaneurysm size.  I had a long conversation with Lynwood regarding the above.  He is a poor surgical candidate due to his comorbidities status post stroke with right-sided deficits, severe pulmonary dysfunction - oxygen  requirement 4-5 L, and nonambulatory state.  He is not interested in any surgery if he can avoid it.  Being that the pseudoaneurysm has not changed in size, we discussed continuing to monitor the site rather than moving to surgery.  Both Bodey and his wife are aware that this can increase in size, and may require surgery in the future. Plan will be to see him again in the office in 3 months with right groin pseudoaneurysm ultrasound as well as ABI and right  fem peroneal arterial duplex ultrasound.

## 2023-10-28 ENCOUNTER — Ambulatory Visit (INDEPENDENT_AMBULATORY_CARE_PROVIDER_SITE_OTHER): Payer: Medicare HMO | Admitting: Vascular Surgery

## 2023-10-28 ENCOUNTER — Encounter: Payer: Self-pay | Admitting: Vascular Surgery

## 2023-10-28 ENCOUNTER — Inpatient Hospital Stay: Payer: Medicare HMO | Admitting: Physical Medicine & Rehabilitation

## 2023-10-28 VITALS — BP 120/65 | HR 74 | Temp 97.8°F | Resp 20

## 2023-10-28 DIAGNOSIS — Z95828 Presence of other vascular implants and grafts: Secondary | ICD-10-CM

## 2023-10-28 DIAGNOSIS — I724 Aneurysm of artery of lower extremity: Secondary | ICD-10-CM | POA: Diagnosis not present

## 2023-10-28 DIAGNOSIS — Z9889 Other specified postprocedural states: Secondary | ICD-10-CM | POA: Diagnosis not present

## 2023-10-29 DIAGNOSIS — I10 Essential (primary) hypertension: Secondary | ICD-10-CM | POA: Diagnosis not present

## 2023-10-29 DIAGNOSIS — J449 Chronic obstructive pulmonary disease, unspecified: Secondary | ICD-10-CM | POA: Diagnosis not present

## 2023-10-29 DIAGNOSIS — M25551 Pain in right hip: Secondary | ICD-10-CM | POA: Diagnosis not present

## 2023-10-29 DIAGNOSIS — I619 Nontraumatic intracerebral hemorrhage, unspecified: Secondary | ICD-10-CM | POA: Diagnosis not present

## 2023-10-29 DIAGNOSIS — G40901 Epilepsy, unspecified, not intractable, with status epilepticus: Secondary | ICD-10-CM | POA: Diagnosis not present

## 2023-10-29 DIAGNOSIS — F32A Depression, unspecified: Secondary | ICD-10-CM | POA: Diagnosis not present

## 2023-10-29 DIAGNOSIS — K5900 Constipation, unspecified: Secondary | ICD-10-CM | POA: Diagnosis not present

## 2023-10-29 DIAGNOSIS — E785 Hyperlipidemia, unspecified: Secondary | ICD-10-CM | POA: Diagnosis not present

## 2023-10-29 DIAGNOSIS — I5021 Acute systolic (congestive) heart failure: Secondary | ICD-10-CM | POA: Diagnosis not present

## 2023-11-01 DIAGNOSIS — F32A Depression, unspecified: Secondary | ICD-10-CM | POA: Diagnosis not present

## 2023-11-01 DIAGNOSIS — I10 Essential (primary) hypertension: Secondary | ICD-10-CM | POA: Diagnosis not present

## 2023-11-01 DIAGNOSIS — K5900 Constipation, unspecified: Secondary | ICD-10-CM | POA: Diagnosis not present

## 2023-11-01 DIAGNOSIS — E785 Hyperlipidemia, unspecified: Secondary | ICD-10-CM | POA: Diagnosis not present

## 2023-11-01 DIAGNOSIS — M25551 Pain in right hip: Secondary | ICD-10-CM | POA: Diagnosis not present

## 2023-11-01 DIAGNOSIS — I619 Nontraumatic intracerebral hemorrhage, unspecified: Secondary | ICD-10-CM | POA: Diagnosis not present

## 2023-11-01 DIAGNOSIS — G40901 Epilepsy, unspecified, not intractable, with status epilepticus: Secondary | ICD-10-CM | POA: Diagnosis not present

## 2023-11-01 DIAGNOSIS — I5021 Acute systolic (congestive) heart failure: Secondary | ICD-10-CM | POA: Diagnosis not present

## 2023-11-01 DIAGNOSIS — J449 Chronic obstructive pulmonary disease, unspecified: Secondary | ICD-10-CM | POA: Diagnosis not present

## 2023-11-02 DIAGNOSIS — L97418 Non-pressure chronic ulcer of right heel and midfoot with other specified severity: Secondary | ICD-10-CM | POA: Diagnosis not present

## 2023-11-02 DIAGNOSIS — L97318 Non-pressure chronic ulcer of right ankle with other specified severity: Secondary | ICD-10-CM | POA: Diagnosis not present

## 2023-11-02 DIAGNOSIS — L89526 Pressure-induced deep tissue damage of left ankle: Secondary | ICD-10-CM | POA: Diagnosis not present

## 2023-11-02 DIAGNOSIS — L89896 Pressure-induced deep tissue damage of other site: Secondary | ICD-10-CM | POA: Diagnosis not present

## 2023-11-03 DIAGNOSIS — I10 Essential (primary) hypertension: Secondary | ICD-10-CM | POA: Diagnosis not present

## 2023-11-03 DIAGNOSIS — I619 Nontraumatic intracerebral hemorrhage, unspecified: Secondary | ICD-10-CM | POA: Diagnosis not present

## 2023-11-03 DIAGNOSIS — G40901 Epilepsy, unspecified, not intractable, with status epilepticus: Secondary | ICD-10-CM | POA: Diagnosis not present

## 2023-11-03 DIAGNOSIS — M25551 Pain in right hip: Secondary | ICD-10-CM | POA: Diagnosis not present

## 2023-11-03 DIAGNOSIS — J449 Chronic obstructive pulmonary disease, unspecified: Secondary | ICD-10-CM | POA: Diagnosis not present

## 2023-11-03 DIAGNOSIS — I5021 Acute systolic (congestive) heart failure: Secondary | ICD-10-CM | POA: Diagnosis not present

## 2023-11-03 DIAGNOSIS — K5900 Constipation, unspecified: Secondary | ICD-10-CM | POA: Diagnosis not present

## 2023-11-03 DIAGNOSIS — E785 Hyperlipidemia, unspecified: Secondary | ICD-10-CM | POA: Diagnosis not present

## 2023-11-03 DIAGNOSIS — F32A Depression, unspecified: Secondary | ICD-10-CM | POA: Diagnosis not present

## 2023-11-08 DIAGNOSIS — I5021 Acute systolic (congestive) heart failure: Secondary | ICD-10-CM | POA: Diagnosis not present

## 2023-11-08 DIAGNOSIS — G40901 Epilepsy, unspecified, not intractable, with status epilepticus: Secondary | ICD-10-CM | POA: Diagnosis not present

## 2023-11-08 DIAGNOSIS — M25551 Pain in right hip: Secondary | ICD-10-CM | POA: Diagnosis not present

## 2023-11-08 DIAGNOSIS — E785 Hyperlipidemia, unspecified: Secondary | ICD-10-CM | POA: Diagnosis not present

## 2023-11-08 DIAGNOSIS — I619 Nontraumatic intracerebral hemorrhage, unspecified: Secondary | ICD-10-CM | POA: Diagnosis not present

## 2023-11-08 DIAGNOSIS — F32A Depression, unspecified: Secondary | ICD-10-CM | POA: Diagnosis not present

## 2023-11-08 DIAGNOSIS — K5900 Constipation, unspecified: Secondary | ICD-10-CM | POA: Diagnosis not present

## 2023-11-08 DIAGNOSIS — J449 Chronic obstructive pulmonary disease, unspecified: Secondary | ICD-10-CM | POA: Diagnosis not present

## 2023-11-08 DIAGNOSIS — I10 Essential (primary) hypertension: Secondary | ICD-10-CM | POA: Diagnosis not present

## 2023-11-09 ENCOUNTER — Other Ambulatory Visit: Payer: Self-pay

## 2023-11-09 DIAGNOSIS — L97418 Non-pressure chronic ulcer of right heel and midfoot with other specified severity: Secondary | ICD-10-CM | POA: Diagnosis not present

## 2023-11-09 DIAGNOSIS — L89896 Pressure-induced deep tissue damage of other site: Secondary | ICD-10-CM | POA: Diagnosis not present

## 2023-11-09 DIAGNOSIS — M25551 Pain in right hip: Secondary | ICD-10-CM | POA: Diagnosis not present

## 2023-11-09 DIAGNOSIS — I739 Peripheral vascular disease, unspecified: Secondary | ICD-10-CM

## 2023-11-09 DIAGNOSIS — I724 Aneurysm of artery of lower extremity: Secondary | ICD-10-CM

## 2023-11-09 DIAGNOSIS — G40901 Epilepsy, unspecified, not intractable, with status epilepticus: Secondary | ICD-10-CM | POA: Diagnosis not present

## 2023-11-09 DIAGNOSIS — I619 Nontraumatic intracerebral hemorrhage, unspecified: Secondary | ICD-10-CM | POA: Diagnosis not present

## 2023-11-09 DIAGNOSIS — L97318 Non-pressure chronic ulcer of right ankle with other specified severity: Secondary | ICD-10-CM | POA: Diagnosis not present

## 2023-11-09 DIAGNOSIS — I5021 Acute systolic (congestive) heart failure: Secondary | ICD-10-CM | POA: Diagnosis not present

## 2023-11-09 DIAGNOSIS — J449 Chronic obstructive pulmonary disease, unspecified: Secondary | ICD-10-CM | POA: Diagnosis not present

## 2023-11-09 DIAGNOSIS — F32A Depression, unspecified: Secondary | ICD-10-CM | POA: Diagnosis not present

## 2023-11-09 DIAGNOSIS — L89526 Pressure-induced deep tissue damage of left ankle: Secondary | ICD-10-CM | POA: Diagnosis not present

## 2023-11-09 DIAGNOSIS — K5900 Constipation, unspecified: Secondary | ICD-10-CM | POA: Diagnosis not present

## 2023-11-09 DIAGNOSIS — E785 Hyperlipidemia, unspecified: Secondary | ICD-10-CM | POA: Diagnosis not present

## 2023-11-09 DIAGNOSIS — I10 Essential (primary) hypertension: Secondary | ICD-10-CM | POA: Diagnosis not present

## 2023-11-10 DIAGNOSIS — I10 Essential (primary) hypertension: Secondary | ICD-10-CM | POA: Diagnosis not present

## 2023-11-10 DIAGNOSIS — G40901 Epilepsy, unspecified, not intractable, with status epilepticus: Secondary | ICD-10-CM | POA: Diagnosis not present

## 2023-11-10 DIAGNOSIS — J449 Chronic obstructive pulmonary disease, unspecified: Secondary | ICD-10-CM | POA: Diagnosis not present

## 2023-11-10 DIAGNOSIS — M25551 Pain in right hip: Secondary | ICD-10-CM | POA: Diagnosis not present

## 2023-11-10 DIAGNOSIS — F32A Depression, unspecified: Secondary | ICD-10-CM | POA: Diagnosis not present

## 2023-11-10 DIAGNOSIS — E785 Hyperlipidemia, unspecified: Secondary | ICD-10-CM | POA: Diagnosis not present

## 2023-11-10 DIAGNOSIS — I5021 Acute systolic (congestive) heart failure: Secondary | ICD-10-CM | POA: Diagnosis not present

## 2023-11-10 DIAGNOSIS — I619 Nontraumatic intracerebral hemorrhage, unspecified: Secondary | ICD-10-CM | POA: Diagnosis not present

## 2023-11-10 DIAGNOSIS — K5900 Constipation, unspecified: Secondary | ICD-10-CM | POA: Diagnosis not present

## 2023-11-11 DIAGNOSIS — J449 Chronic obstructive pulmonary disease, unspecified: Secondary | ICD-10-CM | POA: Diagnosis not present

## 2023-11-11 DIAGNOSIS — I619 Nontraumatic intracerebral hemorrhage, unspecified: Secondary | ICD-10-CM | POA: Diagnosis not present

## 2023-11-11 DIAGNOSIS — G40901 Epilepsy, unspecified, not intractable, with status epilepticus: Secondary | ICD-10-CM | POA: Diagnosis not present

## 2023-11-11 DIAGNOSIS — M25551 Pain in right hip: Secondary | ICD-10-CM | POA: Diagnosis not present

## 2023-11-11 DIAGNOSIS — E785 Hyperlipidemia, unspecified: Secondary | ICD-10-CM | POA: Diagnosis not present

## 2023-11-11 DIAGNOSIS — K5900 Constipation, unspecified: Secondary | ICD-10-CM | POA: Diagnosis not present

## 2023-11-11 DIAGNOSIS — I10 Essential (primary) hypertension: Secondary | ICD-10-CM | POA: Diagnosis not present

## 2023-11-11 DIAGNOSIS — I5021 Acute systolic (congestive) heart failure: Secondary | ICD-10-CM | POA: Diagnosis not present

## 2023-11-11 DIAGNOSIS — F32A Depression, unspecified: Secondary | ICD-10-CM | POA: Diagnosis not present

## 2023-11-12 ENCOUNTER — Other Ambulatory Visit (HOSPITAL_BASED_OUTPATIENT_CLINIC_OR_DEPARTMENT_OTHER): Payer: Self-pay | Admitting: Pulmonary Disease

## 2023-11-12 DIAGNOSIS — J449 Chronic obstructive pulmonary disease, unspecified: Secondary | ICD-10-CM | POA: Diagnosis not present

## 2023-11-12 DIAGNOSIS — F32A Depression, unspecified: Secondary | ICD-10-CM | POA: Diagnosis not present

## 2023-11-12 DIAGNOSIS — I5021 Acute systolic (congestive) heart failure: Secondary | ICD-10-CM | POA: Diagnosis not present

## 2023-11-12 DIAGNOSIS — I619 Nontraumatic intracerebral hemorrhage, unspecified: Secondary | ICD-10-CM | POA: Diagnosis not present

## 2023-11-12 DIAGNOSIS — E785 Hyperlipidemia, unspecified: Secondary | ICD-10-CM | POA: Diagnosis not present

## 2023-11-12 DIAGNOSIS — I110 Hypertensive heart disease with heart failure: Secondary | ICD-10-CM | POA: Diagnosis not present

## 2023-11-12 DIAGNOSIS — M25551 Pain in right hip: Secondary | ICD-10-CM | POA: Diagnosis not present

## 2023-11-12 DIAGNOSIS — G40901 Epilepsy, unspecified, not intractable, with status epilepticus: Secondary | ICD-10-CM | POA: Diagnosis not present

## 2023-11-12 DIAGNOSIS — K5900 Constipation, unspecified: Secondary | ICD-10-CM | POA: Diagnosis not present

## 2023-11-15 ENCOUNTER — Ambulatory Visit (INDEPENDENT_AMBULATORY_CARE_PROVIDER_SITE_OTHER): Payer: Medicare HMO | Admitting: Neurology

## 2023-11-15 ENCOUNTER — Encounter: Payer: Self-pay | Admitting: Neurology

## 2023-11-15 VITALS — BP 100/55 | HR 94 | Ht 71.0 in | Wt 138.8 lb

## 2023-11-15 DIAGNOSIS — I610 Nontraumatic intracerebral hemorrhage in hemisphere, subcortical: Secondary | ICD-10-CM | POA: Diagnosis not present

## 2023-11-15 DIAGNOSIS — I5021 Acute systolic (congestive) heart failure: Secondary | ICD-10-CM | POA: Diagnosis not present

## 2023-11-15 DIAGNOSIS — E785 Hyperlipidemia, unspecified: Secondary | ICD-10-CM | POA: Diagnosis not present

## 2023-11-15 DIAGNOSIS — G40901 Epilepsy, unspecified, not intractable, with status epilepticus: Secondary | ICD-10-CM | POA: Diagnosis not present

## 2023-11-15 DIAGNOSIS — I110 Hypertensive heart disease with heart failure: Secondary | ICD-10-CM | POA: Diagnosis not present

## 2023-11-15 DIAGNOSIS — I619 Nontraumatic intracerebral hemorrhage, unspecified: Secondary | ICD-10-CM | POA: Diagnosis not present

## 2023-11-15 DIAGNOSIS — I482 Chronic atrial fibrillation, unspecified: Secondary | ICD-10-CM

## 2023-11-15 DIAGNOSIS — K5900 Constipation, unspecified: Secondary | ICD-10-CM | POA: Diagnosis not present

## 2023-11-15 DIAGNOSIS — M25551 Pain in right hip: Secondary | ICD-10-CM | POA: Diagnosis not present

## 2023-11-15 DIAGNOSIS — F32A Depression, unspecified: Secondary | ICD-10-CM | POA: Diagnosis not present

## 2023-11-15 DIAGNOSIS — G8110 Spastic hemiplegia affecting unspecified side: Secondary | ICD-10-CM | POA: Diagnosis not present

## 2023-11-15 DIAGNOSIS — J449 Chronic obstructive pulmonary disease, unspecified: Secondary | ICD-10-CM | POA: Diagnosis not present

## 2023-11-15 NOTE — Patient Instructions (Signed)
I had a long discussion with the patient and his wife regarding his recent intracerebral hemorrhage related to anticoagulation with Eliquis for his A-fib and residual spastic right hemiplegia and answered questions.  I recommend continuing physical occupational therapy and strict control of hypertension with blood pressure goal below 130/90 and continue Eliquis for stroke prevention for his A-fib as risk-benefit is in favor of continuing it.  Return for follow-up in 6 months with nurse practitioner call earlier if necessary.

## 2023-11-15 NOTE — Progress Notes (Signed)
[facility] [address] [place]. [name] [address] [phone]       OFFICE FOLLOW-UP NOTE  [name] Date of Birth:  03-11-1951 Medical Record Number:  [id number]   HPI: Mr. Zant is a 73 year old African-American male seen today for initial office follow-up visit following hospital admission for intracerebral hemorrhage in November 2024.  History is obtained from the patient and his wife who is accompanying her today as well as review of electronic medical records and I personally reviewed pertinent available imaging films in PACS.  He has past medical history of COPD on home oxygen, dilated cardiomyopathy, peripheral arterial disease, history of femoral-popliteal bypass in 2010, aortobifemoral bypass in 2016 atrial fibrillation on Eliquis, chronic kidney disease, pulmonary hypertension and aortic insufficiency.  He presented on 09/06/2023 upon awakening from sleep and falling on the floor when he tried to get up.  Wife called EMS noticed that he had significantly elevated blood pressure of 210 systolic.  He was brought in as a code stroke.  On exam his ICH score was 0 and CT head showed a left lentiform nucleus intracerebral hemorrhage without intraventricular extension and hydrocephalus.  Patient was on Eliquis and was given Kcentra for Eliquis reversal.  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  CT scan chest abdomen pelvis was done due to concerns about aortic dissection which showed extensive aortic atherosclerosis with multiple penetrating ulcers and ulcerated plaque with suprarenal aortic dilatation 3.7 x 2.9 cm, large pseudoaneurysm in the region of the distal aortobifemoral bypass graft high-grade stenosis proximal to right renal artery just beyond the ostium with moderate right renal atrophy, he was seen by cardiothoracic surgery who felt no obvious dissection or intramural hematoma to  require intervention.  Vascular surgery was also consulted and did not feel the pseudoaneurysm was new or required any intervention since patient had well perfused pulses in the right foot.  Patient was transferred to inpatient rehab where he stayed for a few weeks before being transferred to skilled nursing facility at [facility] for ongoing rehab needs.  Patient is still significantly weak on the right side with practically no movement.  He is unable to stand without two-person assist but is not able to walk yet.  He feels the pain in his right groin is often limiting his ability to ambulate.  His Eliquis was discontinued for 4 weeks after his hemorrhage and now he is back on it.  He continues to have significant dysarthria and severe right hemiplegia.  He spends most of the time in a wheelchair except when working with therapist he can stand with two-person support.  ROS:   14 system review of systems is positive for weakness, dysarthria, unsteadiness, fall risk, difficulty walking all other systems negative  PMH:  Past Medical History:  Diagnosis Date   Aortic insufficiency    MODERATE WITH A BICUSPID AORTIC VAVLE   Arterial occlusive disease    MULTILEVEL   Atrial fibrillation (HCC)    CHF (congestive heart failure) (HCC)    Chronic bronchitis (HCC)    CKD (chronic kidney disease) stage 3, GFR 30-59 ml/min (HCC)    COPD (chronic obstructive pulmonary disease) (HCC)    Dilated cardiomyopathy (HCC)    WITH EJECTION FRACTION DOWN TO 20-25%--WITH CONGESTIVE HEART FAILURE   Edema    LOWER EXTREMETIES   Emphysema of lung (HCC)    Hypertension    Insulin dependent type 2 diabetes  mellitus (HCC)    Normal coronary arteries 2009   Orthopnea    Peripheral arterial disease (HCC)    Pulmonary hypertension (HCC)     Social History:  Social History   Socioeconomic History   Marital status: Married    Spouse name: Not on file   Number of children: 1   Years of education: Not on file    Highest education level: Not on file  Occupational History   Occupation: retired    Associate Professor: [employer]  Tobacco Use   Smoking status: Former    Current packs/day: 0.00    Average packs/day: 1 pack/day for 40.0 years (40.0 ttl pk-yrs)    Types: Cigarettes    Start date: 10/15/1978    Quit date: 10/15/2018    Years since quitting: 5.0    Passive exposure: Never   Smokeless tobacco: Never   Tobacco comments:    quit 2010 and quit again 2019  Vaping Use   Vaping status: Never Used  Substance and Sexual Activity   Alcohol use: No    Alcohol/week: 0.0 standard drinks of alcohol   Drug use: No   Sexual activity: Not on file  Other Topics Concern   Not on file  Social History Narrative   Not on file   Social Drivers of Health   Financial Resource Strain: Low Risk  (03/05/2023)   Overall Financial Resource Strain (CARDIA)    Difficulty of Paying Living Expenses: Not hard at all  Food Insecurity: No Food Insecurity (03/05/2023)   Hunger Vital Sign    Worried About Running Out of Food in the Last Year: Never true    Ran Out of Food in the Last Year: Never true  Transportation Needs: No Transportation Needs (09/10/2023)   PRAPARE - Administrator, Civil Service (Medical): No    Lack of Transportation (Non-Medical): No  Physical Activity: Sufficiently Active (03/05/2023)   Exercise Vital Sign    Days of Exercise per Week: 5 days    Minutes of Exercise per Session: 30 min  Stress: No Stress Concern Present (03/05/2023)   [employer] of Occupational Health - Occupational Stress Questionnaire    Feeling of Stress : Not at all  Social Connections: Socially Integrated (03/05/2023)   Social Connection and Isolation Panel [NHANES]    Frequency of Communication with Friends and Family: More than three times a week    Frequency of Social Gatherings with Friends and Family: Three times a week    Attends Religious Services: More than 4 times per year    Active Member of  Clubs or Organizations: Yes    Attends Banker Meetings: More than 4 times per year    Marital Status: Married  Catering manager Violence: Not At Risk (09/10/2023)   Humiliation, Afraid, Rape, and Kick questionnaire    Fear of Current or Ex-Partner: No    Emotionally Abused: No    Physically Abused: No    Sexually Abused: No    Medications:   Current Outpatient Medications on File Prior to Visit  Medication Sig Dispense Refill   acetaminophen (TYLENOL) 325 MG tablet Take 2 tablets (650 mg total) by mouth every 4 (four) hours as needed for mild pain (pain score 1-3).     albuterol (PROVENTIL) (2.5 MG/3ML) 0.083% nebulizer solution Inhale 3 mLs into the lungs every 6 (six) hours as needed for wheezing or shortness of breath.     amLODipine (NORVASC) 5 MG tablet TAKE 1 TABLET EVERY  DAY 90 tablet 3   apixaban (ELIQUIS) 5 MG TABS tablet Take 5 mg by mouth 2 (two) times daily.     bisacodyl (DULCOLAX) 5 MG EC tablet Take 1 tablet (5 mg total) by mouth daily.     carvedilol (COREG) 6.25 MG tablet TAKE 1 TABLET TWICE DAILY WITH MEALS (NEED MD APPOINTMENT) 180 tablet 3   cloNIDine (CATAPRES) 0.2 MG tablet Take 1 tablet (0.2 mg total) by mouth every 8 (eight) hours.     diclofenac Sodium (VOLTAREN) 1 % GEL Apply 2 g topically 4 (four) times daily.     Fluticasone-Umeclidin-Vilant (TRELEGY ELLIPTA) 200-62.5-25 MCG/ACT AEPB INHALE 1 PUFF EVERY DAY 180 each 0   gabapentin (NEURONTIN) 100 MG capsule Take 1 capsule (100 mg total) by mouth at bedtime.     hydrALAZINE (APRESOLINE) 100 MG tablet Take 1 tablet (100 mg total) by mouth 3 (three) times daily.     isosorbide mononitrate (IMDUR) 30 MG 24 hr tablet TAKE 1 TABLET BY MOUTH ONCE DAILY. 90 tablet 1   levETIRAcetam (KEPPRA) 500 MG tablet Take 1 tablet (500 mg total) by mouth 2 (two) times daily.     mirtazapine (REMERON) 7.5 MG tablet Take 1 tablet (7.5 mg total) by mouth at bedtime.     Mouthwashes (MOUTH RINSE) LIQD solution 15 mLs by  Mouth Rinse route 4 (four) times daily - after meals and at bedtime.     Multiple Vitamins-Minerals (CENTRUM SILVER 50+MEN) TABS Take 1 tablet by mouth daily with breakfast.     pantoprazole (PROTONIX) 40 MG tablet Take 1 tablet (40 mg total) by mouth daily.     rosuvastatin (CRESTOR) 10 MG tablet TAKE 1 TABLET AT BEDTIME 90 tablet 3   senna-docusate (SENOKOT-S) 8.6-50 MG tablet Take 3 tablets by mouth 2 (two) times daily.     amLODipine (NORVASC) 10 MG tablet Take 1 tablet (10 mg total) by mouth daily. (Patient not taking: Reported on 11/15/2023)     No current facility-administered medications on file prior to visit.    Allergies:  No Known Allergies  Physical Exam General: Frail malnourished looking elderly seated, in no evident distress.  He is on home oxygen. Head: head normocephalic and atraumatic.  Neck: supple with no carotid or supraclavicular bruits Cardiovascular: regular rate and rhythm, no murmurs Musculoskeletal: no deformity Skin:  no rash/petichiae Vascular:  Normal pulses all extremities Vitals:   11/15/23 1354  BP: (!) 100/55  Pulse: 94   Neurologic Exam Mental Status: Awake and fully alert. Oriented to place and time. Recent and remote memory intact. Attention span, concentration and fund of knowledge appropriate. Mood and affect appropriate.  No aphasia.  Mild dysarthria present. Cranial Nerves: Fundoscopic exam reveals sharp disc margins. Pupils equal, briskly reactive to light. Extraocular movements full without nystagmus. Visual fields full to confrontation. Hearing intact.  Right lower facial weakness.  Sensation intact. Face, tongue, palate moves normally and symmetrically.  Motor: Spastic right hemiplegia with only trace movement in the right shoulder.  Normal strength on the left.  Tone is increased on the right. Sensory.: intact to touch ,pinprick .position and vibratory sensation.  Coordination: Rapid alternating movements normal in all extremities.  Finger-to-nose and heel-to-shin performed accurately bilaterally. Gait and Station: Deferred as patient is unable to even stand without two-person assist and is unable to walk Reflexes: 1+ and symmetric. Toes downgoing.   NIHSS  8 Modified Rankin  4   ASSESSMENT: 73 year old African-American male with left basal ganglia hemorrhage related to anticoagulation with  Eliquis for atrial fibrillation in November 2024 with significant residual spastic right hemiplegia.     PLAN:I had a long discussion with the patient and his wife regarding his recent intracerebral hemorrhage related to anticoagulation with Eliquis for his A-fib and residual spastic right hemiplegia and answered questions.  I recommend continuing physical occupational therapy and strict control of hypertension with blood pressure goal below 130/90 and continue Eliquis for stroke prevention for his A-fib as risk-benefit is in favor of continuing it.  Return for follow-up in 6 months with nurse practitioner call earlier if necessary. Greater than 50% of time during this 35 minute visit was spent on counseling,explanation of diagnosis of intracerebral hemorrhage and spastic hemiplegia, planning of further management, discussion with patient and family and coordination of care Delia Heady, MD Note: This document was prepared with digital dictation and possible smart phrase technology. Any transcriptional errors that result from this process are unintentional

## 2023-11-16 DIAGNOSIS — I10 Essential (primary) hypertension: Secondary | ICD-10-CM | POA: Diagnosis not present

## 2023-11-16 DIAGNOSIS — I5021 Acute systolic (congestive) heart failure: Secondary | ICD-10-CM | POA: Diagnosis not present

## 2023-11-16 DIAGNOSIS — E785 Hyperlipidemia, unspecified: Secondary | ICD-10-CM | POA: Diagnosis not present

## 2023-11-16 DIAGNOSIS — J449 Chronic obstructive pulmonary disease, unspecified: Secondary | ICD-10-CM | POA: Diagnosis not present

## 2023-11-16 DIAGNOSIS — M25551 Pain in right hip: Secondary | ICD-10-CM | POA: Diagnosis not present

## 2023-11-16 DIAGNOSIS — G40901 Epilepsy, unspecified, not intractable, with status epilepticus: Secondary | ICD-10-CM | POA: Diagnosis not present

## 2023-11-16 DIAGNOSIS — F32A Depression, unspecified: Secondary | ICD-10-CM | POA: Diagnosis not present

## 2023-11-16 DIAGNOSIS — I619 Nontraumatic intracerebral hemorrhage, unspecified: Secondary | ICD-10-CM | POA: Diagnosis not present

## 2023-11-16 DIAGNOSIS — K5900 Constipation, unspecified: Secondary | ICD-10-CM | POA: Diagnosis not present

## 2023-11-17 ENCOUNTER — Other Ambulatory Visit: Payer: Self-pay | Admitting: Cardiology

## 2023-11-17 DIAGNOSIS — I5021 Acute systolic (congestive) heart failure: Secondary | ICD-10-CM | POA: Diagnosis not present

## 2023-11-17 DIAGNOSIS — G40901 Epilepsy, unspecified, not intractable, with status epilepticus: Secondary | ICD-10-CM | POA: Diagnosis not present

## 2023-11-17 DIAGNOSIS — L97921 Non-pressure chronic ulcer of unspecified part of left lower leg limited to breakdown of skin: Secondary | ICD-10-CM | POA: Diagnosis not present

## 2023-11-17 DIAGNOSIS — I10 Essential (primary) hypertension: Secondary | ICD-10-CM | POA: Diagnosis not present

## 2023-11-17 DIAGNOSIS — L97418 Non-pressure chronic ulcer of right heel and midfoot with other specified severity: Secondary | ICD-10-CM | POA: Diagnosis not present

## 2023-11-17 DIAGNOSIS — L97318 Non-pressure chronic ulcer of right ankle with other specified severity: Secondary | ICD-10-CM | POA: Diagnosis not present

## 2023-11-17 DIAGNOSIS — F32A Depression, unspecified: Secondary | ICD-10-CM | POA: Diagnosis not present

## 2023-11-17 DIAGNOSIS — M25551 Pain in right hip: Secondary | ICD-10-CM | POA: Diagnosis not present

## 2023-11-17 DIAGNOSIS — L97911 Non-pressure chronic ulcer of unspecified part of right lower leg limited to breakdown of skin: Secondary | ICD-10-CM | POA: Diagnosis not present

## 2023-11-17 DIAGNOSIS — E785 Hyperlipidemia, unspecified: Secondary | ICD-10-CM | POA: Diagnosis not present

## 2023-11-17 DIAGNOSIS — I619 Nontraumatic intracerebral hemorrhage, unspecified: Secondary | ICD-10-CM | POA: Diagnosis not present

## 2023-11-17 DIAGNOSIS — J449 Chronic obstructive pulmonary disease, unspecified: Secondary | ICD-10-CM | POA: Diagnosis not present

## 2023-11-17 DIAGNOSIS — K5900 Constipation, unspecified: Secondary | ICD-10-CM | POA: Diagnosis not present

## 2023-11-17 NOTE — Telephone Encounter (Signed)
Prescription refill request for Eliquis received. Indication: AF Last office visit: 02/03/23  P [place] MD Scr: 1.75 on 10/05/23  Epic Age: 73 Weight: 74.9kg  Based on above findings Eliquis 5mg  twice daily is the appropriate dose.  Refill approved.

## 2023-11-19 DIAGNOSIS — M25551 Pain in right hip: Secondary | ICD-10-CM | POA: Diagnosis not present

## 2023-11-19 DIAGNOSIS — J449 Chronic obstructive pulmonary disease, unspecified: Secondary | ICD-10-CM | POA: Diagnosis not present

## 2023-11-19 DIAGNOSIS — G40901 Epilepsy, unspecified, not intractable, with status epilepticus: Secondary | ICD-10-CM | POA: Diagnosis not present

## 2023-11-19 DIAGNOSIS — I10 Essential (primary) hypertension: Secondary | ICD-10-CM | POA: Diagnosis not present

## 2023-11-19 DIAGNOSIS — I619 Nontraumatic intracerebral hemorrhage, unspecified: Secondary | ICD-10-CM | POA: Diagnosis not present

## 2023-11-19 DIAGNOSIS — F32A Depression, unspecified: Secondary | ICD-10-CM | POA: Diagnosis not present

## 2023-11-19 DIAGNOSIS — I5021 Acute systolic (congestive) heart failure: Secondary | ICD-10-CM | POA: Diagnosis not present

## 2023-11-19 DIAGNOSIS — K5900 Constipation, unspecified: Secondary | ICD-10-CM | POA: Diagnosis not present

## 2023-11-19 DIAGNOSIS — E785 Hyperlipidemia, unspecified: Secondary | ICD-10-CM | POA: Diagnosis not present

## 2023-11-22 ENCOUNTER — Emergency Department (HOSPITAL_COMMUNITY): Payer: Medicare HMO

## 2023-11-22 ENCOUNTER — Other Ambulatory Visit: Payer: Self-pay

## 2023-11-22 ENCOUNTER — Observation Stay (HOSPITAL_COMMUNITY)
Admission: EM | Admit: 2023-11-22 | Discharge: 2023-11-23 | Disposition: A | Discharge status: Skilled Nursing Facility | Payer: Medicare HMO | Attending: Family Medicine | Admitting: Family Medicine

## 2023-11-22 ENCOUNTER — Encounter (HOSPITAL_COMMUNITY): Payer: Self-pay | Admitting: Family Medicine

## 2023-11-22 DIAGNOSIS — J441 Chronic obstructive pulmonary disease with (acute) exacerbation: Principal | ICD-10-CM | POA: Insufficient documentation

## 2023-11-22 DIAGNOSIS — E1122 Type 2 diabetes mellitus with diabetic chronic kidney disease: Secondary | ICD-10-CM | POA: Insufficient documentation

## 2023-11-22 DIAGNOSIS — U071 COVID-19: Secondary | ICD-10-CM | POA: Insufficient documentation

## 2023-11-22 DIAGNOSIS — L97519 Non-pressure chronic ulcer of other part of right foot with unspecified severity: Secondary | ICD-10-CM | POA: Diagnosis not present

## 2023-11-22 DIAGNOSIS — Z95820 Peripheral vascular angioplasty status with implants and grafts: Secondary | ICD-10-CM | POA: Insufficient documentation

## 2023-11-22 DIAGNOSIS — I130 Hypertensive heart and chronic kidney disease with heart failure and stage 1 through stage 4 chronic kidney disease, or unspecified chronic kidney disease: Secondary | ICD-10-CM | POA: Diagnosis not present

## 2023-11-22 DIAGNOSIS — I5021 Acute systolic (congestive) heart failure: Secondary | ICD-10-CM | POA: Diagnosis not present

## 2023-11-22 DIAGNOSIS — L97129 Non-pressure chronic ulcer of left thigh with unspecified severity: Secondary | ICD-10-CM | POA: Insufficient documentation

## 2023-11-22 DIAGNOSIS — I5030 Unspecified diastolic (congestive) heart failure: Secondary | ICD-10-CM | POA: Insufficient documentation

## 2023-11-22 DIAGNOSIS — Z79899 Other long term (current) drug therapy: Secondary | ICD-10-CM | POA: Diagnosis not present

## 2023-11-22 DIAGNOSIS — Z8673 Personal history of transient ischemic attack (TIA), and cerebral infarction without residual deficits: Secondary | ICD-10-CM | POA: Insufficient documentation

## 2023-11-22 DIAGNOSIS — E119 Type 2 diabetes mellitus without complications: Secondary | ICD-10-CM

## 2023-11-22 DIAGNOSIS — F32A Depression, unspecified: Secondary | ICD-10-CM | POA: Diagnosis not present

## 2023-11-22 DIAGNOSIS — J9601 Acute respiratory failure with hypoxia: Secondary | ICD-10-CM | POA: Diagnosis not present

## 2023-11-22 DIAGNOSIS — N183 Chronic kidney disease, stage 3 unspecified: Secondary | ICD-10-CM | POA: Insufficient documentation

## 2023-11-22 DIAGNOSIS — Z87891 Personal history of nicotine dependence: Secondary | ICD-10-CM | POA: Insufficient documentation

## 2023-11-22 DIAGNOSIS — I4891 Unspecified atrial fibrillation: Secondary | ICD-10-CM | POA: Diagnosis not present

## 2023-11-22 DIAGNOSIS — G40901 Epilepsy, unspecified, not intractable, with status epilepticus: Secondary | ICD-10-CM | POA: Diagnosis not present

## 2023-11-22 DIAGNOSIS — E870 Hyperosmolality and hypernatremia: Secondary | ICD-10-CM | POA: Insufficient documentation

## 2023-11-22 DIAGNOSIS — Z7901 Long term (current) use of anticoagulants: Secondary | ICD-10-CM | POA: Diagnosis not present

## 2023-11-22 DIAGNOSIS — I619 Nontraumatic intracerebral hemorrhage, unspecified: Secondary | ICD-10-CM | POA: Diagnosis not present

## 2023-11-22 DIAGNOSIS — J449 Chronic obstructive pulmonary disease, unspecified: Secondary | ICD-10-CM | POA: Diagnosis not present

## 2023-11-22 DIAGNOSIS — M25551 Pain in right hip: Secondary | ICD-10-CM | POA: Diagnosis not present

## 2023-11-22 DIAGNOSIS — R0602 Shortness of breath: Secondary | ICD-10-CM | POA: Diagnosis not present

## 2023-11-22 DIAGNOSIS — I10 Essential (primary) hypertension: Secondary | ICD-10-CM | POA: Diagnosis not present

## 2023-11-22 DIAGNOSIS — R7989 Other specified abnormal findings of blood chemistry: Secondary | ICD-10-CM | POA: Insufficient documentation

## 2023-11-22 DIAGNOSIS — K5900 Constipation, unspecified: Secondary | ICD-10-CM | POA: Diagnosis not present

## 2023-11-22 DIAGNOSIS — E785 Hyperlipidemia, unspecified: Secondary | ICD-10-CM | POA: Diagnosis not present

## 2023-11-22 LAB — COMPREHENSIVE METABOLIC PANEL
ALT: 18 U/L (ref 0–44)
AST: 34 U/L (ref 15–41)
Albumin: 3.3 g/dL — ABNORMAL LOW (ref 3.5–5.0)
Alkaline Phosphatase: 57 U/L (ref 38–126)
Anion gap: 14 (ref 5–15)
BUN: 26 mg/dL — ABNORMAL HIGH (ref 8–23)
CO2: 27 mmol/L (ref 22–32)
Calcium: 9.6 mg/dL (ref 8.9–10.3)
Chloride: 108 mmol/L (ref 98–111)
Creatinine, Ser: 2 mg/dL — ABNORMAL HIGH (ref 0.61–1.24)
GFR, Estimated: 35 mL/min — ABNORMAL LOW (ref >60)
Glucose, Bld: 133 mg/dL — ABNORMAL HIGH (ref 70–99)
Potassium: 4.6 mmol/L (ref 3.5–5.1)
Sodium: 149 mmol/L — ABNORMAL HIGH (ref 135–145)
Total Bilirubin: 1.2 mg/dL (ref 0.0–1.2)
Total Protein: 7.4 g/dL (ref 6.5–8.1)

## 2023-11-22 LAB — BASIC METABOLIC PANEL
Anion gap: 10 (ref 5–15)
BUN: 29 mg/dL — ABNORMAL HIGH (ref 8–23)
CO2: 26 mmol/L (ref 22–32)
Calcium: 8.6 mg/dL — ABNORMAL LOW (ref 8.9–10.3)
Chloride: 110 mmol/L (ref 98–111)
Creatinine, Ser: 1.95 mg/dL — ABNORMAL HIGH (ref 0.61–1.24)
GFR, Estimated: 36 mL/min — ABNORMAL LOW (ref >60)
Glucose, Bld: 114 mg/dL — ABNORMAL HIGH (ref 70–99)
Potassium: 3.8 mmol/L (ref 3.5–5.1)
Sodium: 146 mmol/L — ABNORMAL HIGH (ref 135–145)

## 2023-11-22 LAB — CBC WITH DIFFERENTIAL/PLATELET
Abs Immature Granulocytes: 0.06 10*3/uL (ref 0.00–0.07)
Basophils Absolute: 0 10*3/uL (ref 0.0–0.1)
Basophils Relative: 0 %
Eosinophils Absolute: 0 10*3/uL (ref 0.0–0.5)
Eosinophils Relative: 0 %
HCT: 42.5 % (ref 39.0–52.0)
Hemoglobin: 13.6 g/dL (ref 13.0–17.0)
Immature Granulocytes: 1 %
Lymphocytes Relative: 14 %
Lymphs Abs: 1.1 10*3/uL (ref 0.7–4.0)
MCH: 30 pg (ref 26.0–34.0)
MCHC: 32 g/dL (ref 30.0–36.0)
MCV: 93.6 fL (ref 80.0–100.0)
Monocytes Absolute: 0.7 10*3/uL (ref 0.1–1.0)
Monocytes Relative: 9 %
Neutro Abs: 6 10*3/uL (ref 1.7–7.7)
Neutrophils Relative %: 76 %
Platelets: 185 10*3/uL (ref 150–400)
RBC: 4.54 MIL/uL (ref 4.22–5.81)
RDW: 17 % — ABNORMAL HIGH (ref 11.5–15.5)
WBC: 7.9 10*3/uL (ref 4.0–10.5)
nRBC: 0.3 % — ABNORMAL HIGH (ref 0.0–0.2)

## 2023-11-22 LAB — RESP PANEL BY RT-PCR (RSV, FLU A&B, COVID)  RVPGX2
Influenza A by PCR: NEGATIVE
Influenza B by PCR: NEGATIVE
Resp Syncytial Virus by PCR: NEGATIVE
SARS Coronavirus 2 by RT PCR: POSITIVE — AB

## 2023-11-22 LAB — URINALYSIS, W/ REFLEX TO CULTURE (INFECTION SUSPECTED)
Bilirubin Urine: NEGATIVE
Glucose, UA: NEGATIVE mg/dL
Hgb urine dipstick: NEGATIVE
Ketones, ur: 5 mg/dL — AB
Leukocytes,Ua: NEGATIVE
Nitrite: NEGATIVE
Protein, ur: 100 mg/dL — AB
Specific Gravity, Urine: 1.02 (ref 1.005–1.030)
pH: 5 (ref 5.0–8.0)

## 2023-11-22 LAB — PROTIME-INR
INR: 1.8 — ABNORMAL HIGH (ref 0.8–1.2)
Prothrombin Time: 21.2 s — ABNORMAL HIGH (ref 11.4–15.2)

## 2023-11-22 LAB — APTT: aPTT: 47 s — ABNORMAL HIGH (ref 24–36)

## 2023-11-22 LAB — TROPONIN I (HIGH SENSITIVITY)
Troponin I (High Sensitivity): 80 ng/L — ABNORMAL HIGH (ref <18)
Troponin I (High Sensitivity): 107 ng/L (ref <18)
Troponin I (High Sensitivity): 120 ng/L (ref <18)
Troponin I (High Sensitivity): 137 ng/L (ref <18)

## 2023-11-22 LAB — I-STAT CG4 LACTIC ACID, ED
Lactic Acid, Venous: 1.7 mmol/L (ref 0.5–1.9)
Lactic Acid, Venous: 1.6 mmol/L (ref 0.5–1.9)

## 2023-11-22 LAB — CBG MONITORING, ED
Glucose-Capillary: 150 mg/dL — ABNORMAL HIGH (ref 70–99)
Glucose-Capillary: 176 mg/dL — ABNORMAL HIGH (ref 70–99)

## 2023-11-22 LAB — BRAIN NATRIURETIC PEPTIDE: B Natriuretic Peptide: 110.3 pg/mL — ABNORMAL HIGH (ref 0.0–100.0)

## 2023-11-22 MED ORDER — IPRATROPIUM-ALBUTEROL 0.5-2.5 (3) MG/3ML IN SOLN
3.0000 mL | Freq: Two times a day (BID) | RESPIRATORY_TRACT | Status: DC
  Administered 2023-11-23 (×2): 3 mL via RESPIRATORY_TRACT
  Filled 2023-11-22 (×2): qty 3

## 2023-11-22 MED ORDER — INSULIN ASPART 100 UNIT/ML IJ SOLN
0.0000 [IU] | Freq: Three times a day (TID) | INTRAMUSCULAR | Status: DC
  Administered 2023-11-22: 1 [IU] via SUBCUTANEOUS
  Administered 2023-11-23 (×2): 2 [IU] via SUBCUTANEOUS

## 2023-11-22 MED ORDER — ACETAMINOPHEN 325 MG PO TABS: 650.0000 mg | ORAL_TABLET | Freq: Four times a day (QID) | ORAL | Status: DC | PRN

## 2023-11-22 MED ORDER — ACETAMINOPHEN 650 MG RE SUPP: 650.0000 mg | Freq: Four times a day (QID) | RECTAL | Status: DC | PRN

## 2023-11-22 MED ORDER — PREDNISONE 20 MG PO TABS
40.0000 mg | ORAL_TABLET | Freq: Every day | ORAL | Status: DC
  Administered 2023-11-23: 40 mg via ORAL
  Filled 2023-11-22: qty 2

## 2023-11-22 MED ORDER — INSULIN DEGLUDEC 100 UNIT/ML ~~LOC~~ SOPN: 5.0000 [IU] | PEN_INJECTOR | Freq: Every day | SUBCUTANEOUS | Status: DC

## 2023-11-22 MED ORDER — ROSUVASTATIN CALCIUM 5 MG PO TABS
10.0000 mg | ORAL_TABLET | Freq: Every day | ORAL | Status: DC
  Administered 2023-11-22: 10 mg via ORAL
  Filled 2023-11-22: qty 2

## 2023-11-22 MED ORDER — METHYLPREDNISOLONE SODIUM SUCC 40 MG IJ SOLR
40.0000 mg | Freq: Once | INTRAMUSCULAR | Status: AC
  Administered 2023-11-22: 40 mg via INTRAVENOUS
  Filled 2023-11-22: qty 1

## 2023-11-22 MED ORDER — MIRTAZAPINE 15 MG PO TABS
7.5000 mg | ORAL_TABLET | Freq: Every day | ORAL | Status: DC
  Administered 2023-11-22: 7.5 mg via ORAL
  Filled 2023-11-22: qty 1

## 2023-11-22 MED ORDER — PANTOPRAZOLE SODIUM 40 MG PO TBEC
40.0000 mg | DELAYED_RELEASE_TABLET | Freq: Every day | ORAL | Status: DC
  Administered 2023-11-23: 40 mg via ORAL
  Filled 2023-11-22: qty 1

## 2023-11-22 MED ORDER — APIXABAN 5 MG PO TABS: 5.0000 mg | ORAL_TABLET | Freq: Two times a day (BID) | ORAL | Status: DC

## 2023-11-22 MED ORDER — IPRATROPIUM BROMIDE 0.02 % IN SOLN
0.5000 mg | Freq: Once | RESPIRATORY_TRACT | Status: AC
  Administered 2023-11-22: 0.5 mg via RESPIRATORY_TRACT
  Filled 2023-11-22: qty 2.5

## 2023-11-22 MED ORDER — UMECLIDINIUM BROMIDE 62.5 MCG/ACT IN AEPB
1.0000 | INHALATION_SPRAY | Freq: Every day | RESPIRATORY_TRACT | Status: DC
  Filled 2023-11-22: qty 7

## 2023-11-22 MED ORDER — ACETAMINOPHEN 650 MG RE SUPP
650.0000 mg | Freq: Once | RECTAL | Status: AC
Start: 2023-11-22 — End: 2023-11-22
  Administered 2023-11-22: 650 mg via RECTAL
  Filled 2023-11-22: qty 1

## 2023-11-22 MED ORDER — GABAPENTIN 300 MG PO CAPS
600.0000 mg | ORAL_CAPSULE | Freq: Every day | ORAL | Status: DC
  Administered 2023-11-22: 600 mg via ORAL
  Filled 2023-11-22 (×2): qty 2

## 2023-11-22 MED ORDER — ALBUTEROL SULFATE (2.5 MG/3ML) 0.083% IN NEBU
5.0000 mg | INHALATION_SOLUTION | Freq: Once | RESPIRATORY_TRACT | Status: AC
  Administered 2023-11-22: 5 mg via RESPIRATORY_TRACT
  Filled 2023-11-22: qty 6

## 2023-11-22 MED ORDER — CENTRUM SILVER 50+MEN PO TABS: 1.0000 | ORAL_TABLET | Freq: Every day | ORAL | Status: DC

## 2023-11-22 MED ORDER — ALBUTEROL SULFATE (2.5 MG/3ML) 0.083% IN NEBU: 2.5000 mg | INHALATION_SOLUTION | RESPIRATORY_TRACT | Status: DC | PRN

## 2023-11-22 MED ORDER — LEVETIRACETAM 500 MG PO TABS
500.0000 mg | ORAL_TABLET | Freq: Two times a day (BID) | ORAL | Status: DC
  Administered 2023-11-22 – 2023-11-23 (×2): 500 mg via ORAL
  Filled 2023-11-22 (×2): qty 1

## 2023-11-22 MED ORDER — LACTATED RINGERS IV BOLUS: 500.0000 mL | Freq: Once | INTRAVENOUS | Status: DC

## 2023-11-22 MED ORDER — IPRATROPIUM-ALBUTEROL 0.5-2.5 (3) MG/3ML IN SOLN
3.0000 mL | RESPIRATORY_TRACT | Status: DC
  Administered 2023-11-22 (×2): 3 mL via RESPIRATORY_TRACT
  Filled 2023-11-22 (×2): qty 3

## 2023-11-22 MED ORDER — SODIUM CHLORIDE 0.45 % IV BOLUS
500.0000 mL | Freq: Once | INTRAVENOUS | Status: AC
  Administered 2023-11-22: 500 mL via INTRAVENOUS

## 2023-11-22 MED ORDER — INSULIN GLARGINE-YFGN 100 UNIT/ML ~~LOC~~ SOLN
5.0000 [IU] | Freq: Every day | SUBCUTANEOUS | Status: DC
  Filled 2023-11-22: qty 0.05

## 2023-11-22 MED ORDER — GUAIFENESIN 100 MG/5ML PO LIQD: 200.0000 mg | ORAL | Status: DC | PRN

## 2023-11-22 MED ORDER — ADULT MULTIVITAMIN W/MINERALS CH
1.0000 | ORAL_TABLET | Freq: Every day | ORAL | Status: DC
  Administered 2023-11-23: 1 via ORAL
  Filled 2023-11-22: qty 1

## 2023-11-22 MED ORDER — AZITHROMYCIN 500 MG PO TABS
500.0000 mg | ORAL_TABLET | Freq: Every day | ORAL | Status: DC
  Administered 2023-11-22 – 2023-11-23 (×2): 500 mg via ORAL
  Filled 2023-11-22 (×2): qty 2

## 2023-11-22 MED ORDER — FLUTICASONE FUROATE-VILANTEROL 200-25 MCG/ACT IN AEPB
1.0000 | INHALATION_SPRAY | Freq: Every day | RESPIRATORY_TRACT | Status: DC
  Filled 2023-11-22: qty 28

## 2023-11-22 MED ORDER — HEPARIN (PORCINE) 25000 UT/250ML-% IV SOLN
800.0000 [IU]/h | INTRAVENOUS | Status: DC
  Administered 2023-11-22: 800 [IU]/h via INTRAVENOUS
  Filled 2023-11-22: qty 250

## 2023-11-22 NOTE — Assessment & Plan Note (Signed)
" >>  ASSESSMENT AND PLAN FOR T2DM (TYPE 2 DIABETES MELLITUS) (HCC) WRITTEN ON 11/22/2023  6:31 PM BY CHRISTIA BUDDS, MD  Per chart review on 5U Tresiba  and SSI. Last A1c 6.2 09/06/23. Anticipate CBGs will be elevated in setting of steroid use - sensitive SSI - 5 U Lantus  - CBG monitoring 4 times daily, before meals and at bedtime "

## 2023-11-22 NOTE — Assessment & Plan Note (Addendum)
Cr 2.00 on admission, appears baseline is ~1.75. Suspect it is slightly elevated in the setting of volume depletion given pt is hypernatremic with slightly elevated BUN. S/p LR via EMS. - 1/2NS bolus - Strict I/O - am BMP

## 2023-11-22 NOTE — Assessment & Plan Note (Addendum)
On 4L at baseline, presented on 15L nonrebreather due to hypoxia into the 70s. Now on [facility].  Suspect it is in the setting of COVID infection, however will treat for COPD exacerbation as he was wheezing on arrival with productive cough and seems to have improved after DuoNeb.  Low suspicion for pneumonia given unremarkable chest x-ray, low suspicion for PE given that he is on Eliquis (should be taking this since he is at a facility). EKG with no acute change but does have elevated Troponins, reassuringly no chest pain. - admitted to [facility], med-surg, Dr. Pollie Meyer attending - Droplet and airborne precautions - Azithromycin 500mg  x3 days - IV Solumedrol 40mg  today then prednisone 40mg  x4 days - Blood cultures pending - continue Eliquis 5mg  BID - Formulary equivalent for home trelegy - Duoneb q4h - Albuterol neb q4h PRN - Incentive spirometry, pulmonary toiletry - continuous pulse ox, goal 88-92% - trend troponins - daily weights - Strict I/O - Delirium, fall precautions - Turn patient q2h - PT/OT/SLP eval - am BMP, CBC

## 2023-11-22 NOTE — H&P (Addendum)
Hospital Admission History and Physical Service Pager: [phone]  Patient name: Kyle [name] Medical record number: [id number] Date of Birth: October 07, 1951 Age: 73 y.o. Gender: male  Primary Care Provider: Levin Erp, MD Consultants: none Code Status: FULL  Preferred Emergency Contact:  Contact Information     Name Relation Home Work Mobile   Buresh,[name] [phone]  [phone]      Other Contacts   None on File     Chief Complaint: acute hypoxic respiratory failure  Assessment and Plan: [name] is a 73 y.o. male PMH hx CVA with residual dysarthria and right sided deficits, COPD on 4.5L, iliac pseudoaneurysm, afib on eliquis, HFpEF, Aortic insufficiency, PAD, CKD3, T2DM, PAH, pulmonary nodules presenting with acute hypoxic respiratory failure. Differential for presentation of this includes:  COVID: COVID-positive, unclear how long he has had this (will need to verify with facilities).  Suspect this is a major contributor to his shortness of breath, hypoxia, and fever COPD exacerbation: Likely given his hypoxia and wheezing on initial evaluation in the ED that improved after DuoNeb.  He also has productive cough. PE: Less likely given that he is on Eliquis (presume he has not missed doses since he has been at a facility but will check with them), however he does have SOB, hypoxia and tachycardia Pneumonia: Less likely given overall unremarkable chest x-ray and no leukocytosis.  He is febrile but I suspect this is due to COVID CHF exacerbation: Less likely given pt is not volume overloaded on exam, BNP only mildly elevated ACS: Troponins elevated 80>107 (have been previously elevated) however unremarkable EKG and no chest pain, will continue to trend troponins Pneumothorax: less likely given unremarkable chest XR and symmetric chest rise  I have reached out to [facility] facility for more information and gave them a callback number but have not heard back  yet.  Plan to call again shortly.  Will also need to call patient's wife for collateral and to confirm CODE STATUS. Assessment & Plan Acute hypoxic respiratory failure (HCC)  COVID On 4L at baseline, presented on 15L nonrebreather due to hypoxia into the 70s. Now on [facility].  Suspect it is in the setting of COVID infection, however will treat for COPD exacerbation as he was wheezing on arrival with productive cough and seems to have improved after DuoNeb.  Low suspicion for pneumonia given unremarkable chest x-ray, low suspicion for PE given that he is on Eliquis (should be taking this since he is at a facility). EKG with no acute change but does have elevated Troponins, reassuringly no chest pain. - admitted to [facility], med-surg, Dr. Pollie Meyer attending - Droplet and airborne precautions - Azithromycin 500mg  x3 days - IV Solumedrol 40mg  today then prednisone 40mg  x4 days - Blood cultures pending - continue Eliquis 5mg  BID - Formulary equivalent for home trelegy - Duoneb q4h - Albuterol neb q4h PRN - Incentive spirometry, pulmonary toiletry - continuous pulse ox, goal 88-92% - trend troponins - daily weights - Strict I/O - Delirium, fall precautions - Turn patient q2h - PT/OT/SLP eval - am BMP, CBC Elevated troponin Troponins 80>107 in the ED. Low suspicion for ACS given no chest pain and EKG with no new changes. Suspected in setting of CHF vs CKD vs sepsis, has been elevated in the past - continue to trend Hypernatremia Sodium 149 on admission. Appears it was 145-150 in the beginning of December as well. Suspect this is in the setting of volume depletion. S/p LR via  EMS - 1/2 NS bolus - Strict I/O - am BMP Chronic kidney disease, stage 3 unspecified (HCC) Cr 2.00 on admission, appears baseline is ~1.75. Suspect it is slightly elevated in the setting of volume depletion given pt is hypernatremic with slightly elevated BUN. S/p LR via EMS. - 1/2NS bolus - Strict  I/O - am BMP Type 2 diabetes mellitus (HCC) Per chart review on 5U Tresiba and SSI. Last A1c 6.2 09/06/23. Anticipate CBGs will be elevated in setting of steroid use - sensitive SSI - 5 U Lantus - CBG monitoring 4 times daily, before meals and at bedtime Right foot ulcer (HCC) Ulcer to R foot, appears chronic with no sign of infection. Photo in media tab - Check sacrum when able - Consult wound care  Chronic and Stable Problems:  CHF, aortic insufficiency - echo 08/2023 with LVEF 55-60%, Grade 1 diastolic dysfunction, severe calcification of aortic valve with severe stenosis, dilated IVC. Does not appear volume overloaded on exam today Intracranial Hemorrhagic stroke - admitted 11/11-11/15 for hemorrhagic stroke, inpatient rehab from 11/15-12/11. Came to ED after fall out of bed on Eliquis, found to be severely hypertensive in 200s systolic, CT head with acute 7mm hemorrhage in left lentiform nuclei. Has residual R sided deficits and dysarthria.  A-fib - in sinus rhythm but tachycardic here, will restart home meds once confirmed by facility, supposed to be taking Coreg and eliquis. Start elquis, hold coreg with low pressures Hx of Seizure: Continue keppra 500 BID HTN: Hold amlodipine 10 mg daily, hydralazine 100 mg TID, clonidine 0.2 mg q8h, imdur 30 daily COPD PAD Pulmonary hypertension   FEN/GI: dysphagia 1 diet with nectar thick liquids (discharged on this last admission) VTE Prophylaxis: Eliquis 5mg  BID  Disposition: med-surg  History of Present Illness:  Kyle NOBOA is a 73 y.o. male presenting with hypoxia and shortness of breath.  Per EMS, patient was found at his facility down for unknown amount of time not wearing his oxygen.  He is on 4.5 L at baseline.  When EMS arrived he was at 75%, but on 4 L with improvement to the 80s, then required 15 L nonrebreather.  EMS reports he was recently diagnosed with COVID.  Spoke with wife Edgerton Lions: Tested him this morning at facility for  COVID and was positive States at 10 AM was 101 F no coughing  His O2 was left off by weekend nurse apparently  Full history limited secondary to patient's dysarthria-level 5 caveat Patient is unsure if he was diagnosed with COVID.  States that his oxygen comes off all the time but unable to clarify.  Denies shortness of breath, chest pain.  Reports that he has had "nonproductive" cough although productive in room.  Attempted to call facility, no answer, gave call back number.  In the ED, pt received a duoneb. Febrile to 102.9. Hypernatremic at 149. U/A unremarkable. COVID +, flu/RSV negative. PT/INR, APTT prolonged. CBC unremarkable. Lactic acid 1.7. Troponin 80, repeat pending. BNP 110.3. CXR with chronically coarsened interstitial markings, no acute findings.   Review Of Systems: Per HPI with the following additions: none  Pertinent Past Medical History: CHF - echo 08/2023 with LVEF 55-60%, Grade 1 diastolic dysfunction, severe calcification of aortic valve with severe stenosis, dilated IVC Intracranial Hemorrhagic stroke - admitted 11/11-11/15 for hemorrhagic stroke, inpatient rehab from 11/15-12/11. Came to ED after fall out of bed on Eliquis, found to be severely hypertensive in 200s systolic, CT head with acute 7mm hemorrhage in left  lentiform nuclei. Has residual R sided deficits and dysarthria.  COPD Aortic insufficiency A-fib CKD stage III Type 2 diabetes PAD Pulmonary hypertension  Remainder reviewed in history tab.   Pertinent Past Surgical History: Cardiac cath 2009 L femoral endarterectomy 2016 Fem-pop bypass graft 2012 R Fem-tib bypass graft 2020  Remainder reviewed in history tab.  Pertinent Social History: Tobacco use: Former Alcohol use: No Other Substance use: No Lives at [facility]  Pertinent Family History: Mother - diabetes Sister - HLD, HTN  Remainder reviewed in history tab.   Important Outpatient Medications: Albuterol every 6 hours  as needed Amlodipine 10 mg daily- Eliquis 5 mg twice daily- Carvedilol 6.25 mg twice daily- Clonidine 0.2 mg every 8 hours- Trelegy inhaler 1 puff daily- Gabapentin 600 mg at bedtime- Hydralazine 100 mg 3 times daily- Imdur 30 mg daily- Keppra 500 mg twice daily-? Remeron 7.5 mg at bedtime Protonix 40 mg daily- Crestor 10 mg daily- Furosemide 1/2 tablet a day 40 mg?  Remainder reviewed in medication history.   Objective: BP (!) 114/56 (BP Location: Left Arm)   Pulse 98   Temp (!) 102.9 F (39.4 C) (Oral)   Resp 17   Ht 5\' 11"  (1.803 m)   Wt 63 kg   SpO2 92%   BMI 19.36 kg/m  Exam: General: Chronically ill-appearing, no acute distress Eyes: EOMI ENTM: Dry mucous membranes Cardiovascular: Tachycardic, regular rhythm, no murmurs Respiratory: Normal work of breathing on 6 L nasal cannula.  Coarse breath sounds and rhonchi bilaterally.  No wheezes auscultated.  Full exam limited secondary to residual weakness from stroke, unable to completely lift him Gastrointestinal: Normal bowel sounds, soft, nontender MSK: Right upper extremity and right lower extremity weakness residual from stroke in November.  5 out of 5 strength left upper and left lower extremities.  Rash to right hand as seen in picture below.  Wound to right ankle as seen in picture below.  Unable to visualize right hip. R foot/ankle in boot. Neuro: Alert and oriented to person     Labs:  CBC BMET  Recent Labs  Lab 11/22/23 1219  WBC 7.9  HGB 13.6  HCT 42.5  PLT 185   Recent Labs  Lab 11/22/23 1212  NA 149*  K 4.6  CL 108  CO2 27  BUN 26*  CREATININE 2.00*  GLUCOSE 133*  CALCIUM 9.6     PT 21.2, INR 1.8 APTT 47 Bcx pending BNP 110.3 Troponin 80>107  (88 2 years ago) Respiratory panel Positive Covid, negative flu, negative RSV Lactic acid 1.7 U/A unremarkable  EKG: Sinus tachycardia 106BPM. Qtc . LAD. No ST-T changes.    Imaging Studies Performed:  CXR  11/22/23 IMPRESSION: Chronically coarsened interstitial markings. No acute cardiopulmonary findings.   Everhart, Tamala Julian, DO 11/22/2023, 2:12 PM PGY-1, [facility]  FPTS Intern pager: [phone], text pages welcome Secure chat group [facility] Teaching Service   Upper Level Addendum:  I have seen and evaluated this patient along with Dr. Rexene Alberts and reviewed the above note, making necessary revisions as appropriate.  I agree with the medical decision making and physical exam as noted above.  Levin Erp, MD PGY-3 [facility] Family Medicine Residency

## 2023-11-22 NOTE — Progress Notes (Signed)
PHARMACY - ANTICOAGULATION CONSULT NOTE  Pharmacy Consult for heparin Indication: chest pain/ACS  No Known Allergies  Patient Measurements: Height: 5\' 11"  (180.3 cm) Weight: 63 kg (138 lb 12.8 oz) IBW/kg (Calculated) : 75.3 Heparin Dosing Weight: TBW  Vital Signs: Temp: 102.1 F (38.9 C) (01/27 1420) Temp Source: Oral (01/27 1420) BP: 93/71 (01/27 1415) Pulse Rate: 99 (01/27 1415)  Labs: Recent Labs    11/22/23 1212 11/22/23 1219 11/22/23 1431 11/22/23 1648  HGB  --  13.6  --   --   HCT  --  42.5  --   --   PLT  --  185  --   --   APTT 47*  --   --   --   LABPROT 21.2*  --   --   --   INR 1.8*  --   --   --   CREATININE 2.00*  --   --   --   TROPONINIHS 80*  --  107* 120*    Estimated Creatinine Clearance: 30.2 mL/min (A) (by C-G formula based on SCr of 2 mg/dL (H)).   Medical History: Past Medical History:  Diagnosis Date   Aortic insufficiency    MODERATE WITH A BICUSPID AORTIC VAVLE   Arterial occlusive disease    MULTILEVEL   Atrial fibrillation (HCC)    CHF (congestive heart failure) (HCC)    Chronic bronchitis (HCC)    CKD (chronic kidney disease) stage 3, GFR 30-59 ml/min (HCC)    COPD (chronic obstructive pulmonary disease) (HCC)    Dilated cardiomyopathy (HCC)    WITH EJECTION FRACTION DOWN TO 20-25%--WITH CONGESTIVE HEART FAILURE   Edema    LOWER EXTREMETIES   Emphysema of lung (HCC)    Hypertension    Insulin dependent type 2 diabetes mellitus (HCC)    Normal coronary arteries 2009   Orthopnea    Peripheral arterial disease (HCC)    Pulmonary hypertension (HCC)     Assessment: 72 YOM presenting with SOB, troponin elevation, hx of afib on Eliquis PTA with last dose 1/27 AM  Goal of Therapy:  Heparin level 0.3-0.7 units/ml aPTT 66-102 seconds Monitor platelets by anticoagulation protocol: Yes   Plan:  Heparin gtt at 800 units/hr, no bolus F/u 8 hour aPTT/HL F/u repeat EKG/troponin and ability to transition back to  Eliquis  Daylene Posey, PharmD, [facility] Clinical Pharmacist ED Pharmacist Phone # [phone] 11/22/2023 6:53 PM

## 2023-11-22 NOTE — Plan of Care (Signed)
Spoke with Dr. Antoine Poche with cardiology No chest pain, EKG without ST elevation Elevated troponin 80>107>120 24 hours of heparin drip not unreasonable Get repeat EKG in morning - trend troponin

## 2023-11-22 NOTE — Assessment & Plan Note (Signed)
Sodium 149 on admission. Appears it was 145-150 in the beginning of December as well. Suspect this is in the setting of volume depletion. S/p LR via EMS - 1/2 NS bolus - Strict I/O - am BMP

## 2023-11-22 NOTE — Plan of Care (Signed)
FMTS Interim Progress Note  S:Patient is doing well and resting comfortably with no complaints.   O: BP (!) 102/59   Pulse 82   Temp (!) 102.1 F (38.9 C) (Oral)   Resp 17   Ht 5\' 11"  (1.803 m)   Wt 63 kg   SpO2 93%   BMI 19.36 kg/m   Card: RRR Resp: Coarse breath sounds with normal wob   A/P: Continue with plan per day team COVID +  Hypernatremia stable - AM BMP  Neurologically intact   Alfredo Martinez, MD 11/22/2023, 9:03 PM PGY-3, [facility] pager [phone]

## 2023-11-22 NOTE — Assessment & Plan Note (Addendum)
Per chart review on 5U Tresiba and SSI. Last A1c 6.2 09/06/23. Anticipate CBGs will be elevated in setting of steroid use - sensitive SSI - 5 U Lantus - CBG monitoring 4 times daily, before meals and at bedtime

## 2023-11-22 NOTE — ED Provider Notes (Signed)
[place] EMERGENCY DEPARTMENT AT [facility] Provider Note   CSN: [id number] Arrival date & time: 11/22/23  1123     History  Chief Complaint  Patient presents with   Shortness of Breath    Kyle [name] is a 73 y.o. male.  This is a 73 year old male presenting emergency department for hypoxia.  Was coming from facility found to be off oxygen with hypoxia down to 75%.  EMS placed on his 4-1/2 L with little improvement.  Arrived on 15 L nonrebreather.  Patient is a poor historian secondary to recent stroke/dysarthria.  Denies chest pain, reports breathing improved with oxygen.   Shortness of Breath      Home Medications Prior to Admission medications   Medication Sig Start Date End Date Taking? Authorizing Provider  acetaminophen (TYLENOL) 325 MG tablet Take 2 tablets (650 mg total) by mouth every 4 (four) hours as needed for mild pain (pain score 1-3). Patient taking differently: Take 650 mg by mouth every 6 (six) hours as needed for mild pain (pain score 1-3). 09/27/23  Yes Love, Evlyn Kanner, PA-C  amLODipine (NORVASC) 10 MG tablet Take 1 tablet (10 mg total) by mouth daily. 09/11/23  Yes Erick Alley, DO  apixaban (ELIQUIS) 5 MG TABS tablet TAKE 1 TABLET TWICE DAILY 11/17/23  Yes [place], Peter M, MD  bisacodyl (DULCOLAX) 10 MG suppository Place 10 mg rectally daily as needed for moderate constipation.   Yes [provider]  bisacodyl (DULCOLAX) 5 MG EC tablet Take 1 tablet (5 mg total) by mouth daily. 10/06/23  Yes Love, Evlyn Kanner, PA-C  carvedilol (COREG) 6.25 MG tablet TAKE 1 TABLET TWICE DAILY WITH MEALS (NEED MD APPOINTMENT) 03/01/23  Yes [place], Peter M, MD  cloNIDine (CATAPRES) 0.2 MG tablet Take 1 tablet (0.2 mg total) by mouth every 8 (eight) hours. 09/10/23  Yes Erick Alley, DO  diclofenac Sodium (VOLTAREN) 1 % GEL Apply 2 g topically 4 (four) times daily. 10/05/23  Yes Love, Evlyn Kanner, PA-C  Fluticasone-Umeclidin-Vilant (TRELEGY ELLIPTA) 200-62.5-25  MCG/ACT AEPB INHALE 1 PUFF EVERY DAY 11/15/23  Yes Oretha Milch, MD  gabapentin (NEURONTIN) 600 MG tablet Take 600 mg by mouth at bedtime.   Yes [provider]  guaifenesin (ROBITUSSIN) 100 MG/5ML syrup Take 200 mg by mouth in the morning and at bedtime.   Yes [provider]  hydrALAZINE (APRESOLINE) 100 MG tablet Take 1 tablet (100 mg total) by mouth 3 (three) times daily. 09/10/23  Yes Erick Alley, DO  insulin aspart (NOVOLOG) 100 UNIT/ML injection Inject 0-10 Units into the skin 3 (three) times daily before meals. Sliding Scale: If BG < 150, 0 units; BG 151-199, 2 units; BG 200-249, 4 units; BG 250-299, 6 units; BG 300-349, 8 units; 350-399, 10 units; > 400, notify MD   Yes [provider]  insulin degludec (TRESIBA) 100 UNIT/ML FlexTouch Pen Inject 5 Units into the skin daily.   Yes [provider]  isosorbide mononitrate (IMDUR) 30 MG 24 hr tablet TAKE 1 TABLET BY MOUTH ONCE DAILY. 07/26/23  Yes [place], Peter M, MD  levETIRAcetam (KEPPRA) 500 MG tablet Take 1 tablet (500 mg total) by mouth 2 (two) times daily. 09/10/23  Yes Erick Alley, DO  magnesium citrate SOLN Take 1 Bottle by mouth once.   Yes [provider]  mirtazapine (REMERON) 7.5 MG tablet Take 1 tablet (7.5 mg total) by mouth at bedtime. 10/05/23  Yes Love, Evlyn Kanner, PA-C  Multiple Vitamins-Minerals (CENTRUM SILVER 50+MEN) TABS  Take 1 tablet by mouth daily with breakfast.   Yes [provider]  pantoprazole (PROTONIX) 40 MG tablet Take 1 tablet (40 mg total) by mouth daily. 10/06/23  Yes Love, Evlyn Kanner, PA-C  polyethylene glycol (MIRALAX / GLYCOLAX) 17 g packet Take 17 g by mouth daily.   Yes [provider]  rosuvastatin (CRESTOR) 10 MG tablet TAKE 1 TABLET AT BEDTIME 08/30/23  Yes Maeola Harman, MD  senna-docusate (SENOKOT-S) 8.6-50 MG tablet Take 3 tablets by mouth 2 (two) times daily. 10/05/23  Yes Love, Evlyn Kanner, PA-C  triamcinolone cream (KENALOG) 0.1 %  Apply 1 Application topically 2 (two) times daily. Apply to affected area of right hand 11/16/23 11/23/23 Yes [provider]      Allergies    Patient has no known allergies.    Review of Systems   Review of Systems  Respiratory:  Positive for shortness of breath.     Physical Exam Updated Vital Signs BP 93/71 (BP Location: Left Arm)   Pulse 99   Temp (!) 102.1 F (38.9 C) (Oral)   Resp 18   Ht 5\' 11"  (1.803 m)   Wt 63 kg   SpO2 90%   BMI 19.36 kg/m  Physical Exam Vitals and nursing note reviewed.  Constitutional:      General: He is not in acute distress.    Appearance: He is not toxic-appearing.  HENT:     Head: Normocephalic.     Mouth/Throat:     Mouth: Mucous membranes are dry.  Cardiovascular:     Rate and Rhythm: Normal rate and regular rhythm.  Pulmonary:     Effort: Tachypnea present.     Breath sounds: Rhonchi present.  Musculoskeletal:     Cervical back: Normal range of motion.     Right lower leg: No edema.     Left lower leg: No edema.  Skin:    General: Skin is warm.  Neurological:     Mental Status: He is alert.  Psychiatric:        Mood and Affect: Mood normal.        Behavior: Behavior normal.     ED Results / Procedures / Treatments   Labs (all labs ordered are listed, but only abnormal results are displayed) Labs Reviewed  RESP PANEL BY RT-PCR (RSV, FLU A&B, COVID)  [id number] - Abnormal; Notable for the following components:      Result Value   SARS Coronavirus 2 by RT PCR POSITIVE (*)    All other components within normal limits  COMPREHENSIVE METABOLIC PANEL - Abnormal; Notable for the following components:   Sodium 149 (*)    Glucose, Bld 133 (*)    BUN 26 (*)    Creatinine, Ser 2.00 (*)    Albumin 3.3 (*)    GFR, Estimated 35 (*)    All other components within normal limits  PROTIME-INR - Abnormal; Notable for the following components:   Prothrombin Time 21.2 (*)    INR 1.8 (*)    All other components within normal  limits  APTT - Abnormal; Notable for the following components:   aPTT 47 (*)    All other components within normal limits  URINALYSIS, W/ REFLEX TO CULTURE (INFECTION SUSPECTED) - Abnormal; Notable for the following components:   Color, Urine AMBER (*)    APPearance CLOUDY (*)    Ketones, ur 5 (*)    Protein, ur 100 (*)    Bacteria, UA MANY (*)  All other components within normal limits  BRAIN NATRIURETIC PEPTIDE - Abnormal; Notable for the following components:   B Natriuretic Peptide 110.3 (*)    All other components within normal limits  CBC WITH DIFFERENTIAL/PLATELET - Abnormal; Notable for the following components:   RDW 17.0 (*)    nRBC 0.3 (*)    All other components within normal limits  TROPONIN I (HIGH SENSITIVITY) - Abnormal; Notable for the following components:   Troponin I (High Sensitivity) 80 (*)    All other components within normal limits  CULTURE, BLOOD (ROUTINE X 2)  CULTURE, BLOOD (ROUTINE X 2)  CBC WITH DIFFERENTIAL/PLATELET  I-STAT CG4 LACTIC ACID, ED  I-STAT CG4 LACTIC ACID, ED  TROPONIN I (HIGH SENSITIVITY)    EKG EKG Interpretation Date/Time:  Monday November 22 2023 11:41:20 EST Ventricular Rate:  106 PR Interval:  150 QRS Duration:  82 QT Interval:  332 QTC Calculation: 441 R Axis:   -6  Text Interpretation: Sinus tachycardia Abnormal R-wave progression, early transition Borderline repolarization abnormality Confirmed by Estanislado Pandy [phone]) on 11/22/2023 12:09:10 PM  Radiology DG Chest Port 1 View Result Date: 11/22/2023 CLINICAL DATA:  Shortness of breath EXAM: PORTABLE CHEST 1 VIEW COMPARISON:  09/10/2023, 09/06/2023 FINDINGS: The heart size and mediastinal contours are stable. Chronically coarsened interstitial markings. No focal airspace consolidation, pleural effusion, or pneumothorax. The visualized skeletal structures are unremarkable. IMPRESSION: Chronically coarsened interstitial markings. No acute cardiopulmonary findings.  Electronically Signed   By: Duanne Guess D.O.   On: 11/22/2023 14:14    Procedures Procedures    Medications Ordered in ED Medications  albuterol (PROVENTIL) (2.5 MG/3ML) 0.083% nebulizer solution 5 mg (5 mg Nebulization Given 11/22/23 1225)  ipratropium (ATROVENT) nebulizer solution 0.5 mg (0.5 mg Nebulization Given 11/22/23 1225)  acetaminophen (TYLENOL) suppository 650 mg (650 mg Rectal Given 11/22/23 1219)    ED Course/ Medical Decision Making/ A&P Clinical Course as of 11/22/23 1526  Mon Nov 22, 2023  1137 Admitted in November for hemorrhagic stroke; per d/c summary: "COPD  CHF Baseline oxygen requirement at home 4.5 L nasal cannula.  Required increased oxygen requirements in the setting of acute stroke to 12 L.  Thought to be mild component of heart failure exacerbation as well.  Diuresed as needed with IV Lasix.  Weaned down to 6 L nasal cannula and was deemed appropriate to discharge to CIR. Restarted inhaler (Breo on formulary in place of home Trelegy) at discharge.   Afib Eliquis reversed with Theodoro Parma in setting of ICH. Neurology recommended to consider restarting anticoagulation in 2 weeks if stable and blood pressure under control "  [TY]  1215 Patient with reported recent diagnosis of COVID, unclear when that is.  Does have CHF.  His fever and tachycardia could be secondary to same.  Will hold off on IV fluids and antibiotics until we obtain further information with chest x-ray lab workup. [TY]  1224 Lactic Acid, Venous: 1.7 [TY]  1420 Spoke with family medicine team; will see and admit.  [TY]    Clinical Course User Index [TY] Coral Spikes, DO                                 Medical Decision Making 73 year old male present to the emergency department for hypoxia.  Febrile, slightly elevated heart rate, increased oxygen requirements to 6 L nasal cannula saturating in the low 90s.  EMS reported recent COVID-positive outpatient, unable to  locate records.  Repeat  testing showed that he is in fact COVID-positive this would likely explain his fever.  Chest x-ray without pneumonia.  Labs reassuring with no leukocytosis or elevated lactate to suggest systemic infection.  Troponin and BNP appear to be at his baseline.  Hypernatremia noted which I suspect is secondary to volume depletion as clinically appears dry.  Given breathing treatment here.  Plan to admit for acute on chronic hypoxic respiratory failure in the setting of COVID.  Amount and/or Complexity of Data Reviewed External Data Reviewed:     Details: See ED course.  Labs: ordered. Decision-making details documented in ED Course. Radiology: ordered.  Risk OTC drugs. Prescription drug management. Decision regarding hospitalization.          Final Clinical Impression(s) / ED Diagnoses Final diagnoses:  COPD exacerbation [facility])    Rx / DC Orders ED Discharge Orders     None         Coral Spikes, DO 11/22/23 1526

## 2023-11-22 NOTE — Assessment & Plan Note (Addendum)
Ulcer to R foot, appears chronic with no sign of infection. Photo in media tab - Check sacrum when able - Consult wound care

## 2023-11-22 NOTE — Assessment & Plan Note (Signed)
Troponins 80>107 in the ED. Low suspicion for ACS given no chest pain and EKG with no new changes. Suspected in setting of CHF vs CKD vs sepsis, has been elevated in the past - continue to trend

## 2023-11-22 NOTE — Hospital Course (Addendum)
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 His hospital course is detailed below:  AHRF  COVID  COPD Exacerbation Presented with SOB and required NRB due to hypoxia to the 70s, oxygen requirement was at baseline prior to discharge of 4.5L. Symptoms were suspected to be secondary to him having COVID and being without his oxygen for prolonged amount of time at his facility.  Initially treated with azithromycin for possible COPD exacerbation however discontinued this after 2 doses.  Treated patient with DuoNebs, IV Solu-Medrol x1, and then started on prednisone 40 mg p.o.  Patient's symptoms were much improved at time of discharge.  Hypernatremia Na 149 upon admission, possible due to volume depletion. Patient was given two fluid boluses and BMP monitored closely. Upon discharge Na improved to 144 (normal).  Elevated troponins Troponins were elevated in the ED upon arrival. No subjective chest pain. EKG without ST elevation. Treated with heparin drip for 24 hours. Repeat EKG unremarkable and troponins trended down. Do not suspect this to be due to ACS.   Other chronic conditions were medically managed with home medications and formulary alternatives as necessary: R foot ulcer: wound care consulted A-fib: continued Eliquis HTN: Held amlodipine, hydralazine, clonidine, and imdur CKD3: Stable T2DM: CBGs stable in 100s, placed on sensitive SSI R foot ulcer, R hip ulcer: Wound care recommendations as below: FOOT: Wound type:full thickness wound; left dorsiflexion surface; arterial Pressure Injury POA: NA Measurement: less than 1cm  Wound bed:100% eschar  Drainage (amount, consistency, odor) none Periwound: intact   Dressing procedure/placement/frequency: Paint area with betadine, keep dry and stable in patient with PAD.  PCP Follow-up Recommendations: Please follow up on wounds Ensure pt is receiving his 4.5L O2 at facility

## 2023-11-22 NOTE — ED Triage Notes (Addendum)
Patient presents via EMS from [place] with shortness of breath and covid. Per EMS, the facility called because they found the patient without oxygen on; patient is on 4.5L [place] at baseline. Per EMS, patient's SpO2 was initially 75% on 4L [place]. Patient received approximately LR en route from EMS. Per EMS, patient has history of stroke with dysarthria and right-sided deficits. Patient arrives on 15L NRB; transitioned to 4.5L [place] for assessment.

## 2023-11-23 DIAGNOSIS — E1140 Type 2 diabetes mellitus with diabetic neuropathy, unspecified: Secondary | ICD-10-CM | POA: Diagnosis present

## 2023-11-23 DIAGNOSIS — E1151 Type 2 diabetes mellitus with diabetic peripheral angiopathy without gangrene: Secondary | ICD-10-CM | POA: Diagnosis present

## 2023-11-23 DIAGNOSIS — J449 Chronic obstructive pulmonary disease, unspecified: Secondary | ICD-10-CM | POA: Diagnosis not present

## 2023-11-23 DIAGNOSIS — J9601 Acute respiratory failure with hypoxia: Secondary | ICD-10-CM | POA: Diagnosis not present

## 2023-11-23 DIAGNOSIS — J439 Emphysema, unspecified: Secondary | ICD-10-CM | POA: Diagnosis present

## 2023-11-23 DIAGNOSIS — J441 Chronic obstructive pulmonary disease with (acute) exacerbation: Secondary | ICD-10-CM | POA: Diagnosis not present

## 2023-11-23 DIAGNOSIS — I5022 Chronic systolic (congestive) heart failure: Secondary | ICD-10-CM | POA: Diagnosis present

## 2023-11-23 DIAGNOSIS — J9611 Chronic respiratory failure with hypoxia: Secondary | ICD-10-CM | POA: Diagnosis not present

## 2023-11-23 DIAGNOSIS — G4700 Insomnia, unspecified: Secondary | ICD-10-CM | POA: Diagnosis present

## 2023-11-23 DIAGNOSIS — E870 Hyperosmolality and hypernatremia: Secondary | ICD-10-CM | POA: Diagnosis not present

## 2023-11-23 DIAGNOSIS — Z794 Long term (current) use of insulin: Secondary | ICD-10-CM | POA: Diagnosis not present

## 2023-11-23 DIAGNOSIS — I619 Nontraumatic intracerebral hemorrhage, unspecified: Secondary | ICD-10-CM | POA: Diagnosis not present

## 2023-11-23 DIAGNOSIS — I5030 Unspecified diastolic (congestive) heart failure: Secondary | ICD-10-CM | POA: Diagnosis not present

## 2023-11-23 DIAGNOSIS — R262 Difficulty in walking, not elsewhere classified: Secondary | ICD-10-CM | POA: Diagnosis not present

## 2023-11-23 DIAGNOSIS — F32A Depression, unspecified: Secondary | ICD-10-CM | POA: Diagnosis not present

## 2023-11-23 DIAGNOSIS — J9621 Acute and chronic respiratory failure with hypoxia: Secondary | ICD-10-CM | POA: Diagnosis not present

## 2023-11-23 DIAGNOSIS — E785 Hyperlipidemia, unspecified: Secondary | ICD-10-CM | POA: Diagnosis not present

## 2023-11-23 DIAGNOSIS — L97911 Non-pressure chronic ulcer of unspecified part of right lower leg limited to breakdown of skin: Secondary | ICD-10-CM | POA: Diagnosis not present

## 2023-11-23 DIAGNOSIS — L97318 Non-pressure chronic ulcer of right ankle with other specified severity: Secondary | ICD-10-CM | POA: Diagnosis not present

## 2023-11-23 DIAGNOSIS — Z8616 Personal history of COVID-19: Secondary | ICD-10-CM | POA: Diagnosis not present

## 2023-11-23 DIAGNOSIS — I2510 Atherosclerotic heart disease of native coronary artery without angina pectoris: Secondary | ICD-10-CM | POA: Diagnosis present

## 2023-11-23 DIAGNOSIS — E119 Type 2 diabetes mellitus without complications: Secondary | ICD-10-CM | POA: Diagnosis not present

## 2023-11-23 DIAGNOSIS — R059 Cough, unspecified: Secondary | ICD-10-CM | POA: Diagnosis not present

## 2023-11-23 DIAGNOSIS — Z7401 Bed confinement status: Secondary | ICD-10-CM | POA: Diagnosis not present

## 2023-11-23 DIAGNOSIS — N1831 Chronic kidney disease, stage 3a: Secondary | ICD-10-CM | POA: Diagnosis not present

## 2023-11-23 DIAGNOSIS — L97418 Non-pressure chronic ulcer of right heel and midfoot with other specified severity: Secondary | ICD-10-CM | POA: Diagnosis not present

## 2023-11-23 DIAGNOSIS — J189 Pneumonia, unspecified organism: Secondary | ICD-10-CM | POA: Diagnosis not present

## 2023-11-23 DIAGNOSIS — I482 Chronic atrial fibrillation, unspecified: Secondary | ICD-10-CM | POA: Diagnosis not present

## 2023-11-23 DIAGNOSIS — R4182 Altered mental status, unspecified: Secondary | ICD-10-CM | POA: Diagnosis not present

## 2023-11-23 DIAGNOSIS — I69351 Hemiplegia and hemiparesis following cerebral infarction affecting right dominant side: Secondary | ICD-10-CM | POA: Diagnosis not present

## 2023-11-23 DIAGNOSIS — J9811 Atelectasis: Secondary | ICD-10-CM | POA: Diagnosis not present

## 2023-11-23 DIAGNOSIS — M6281 Muscle weakness (generalized): Secondary | ICD-10-CM | POA: Diagnosis not present

## 2023-11-23 DIAGNOSIS — Z7901 Long term (current) use of anticoagulants: Secondary | ICD-10-CM | POA: Diagnosis not present

## 2023-11-23 DIAGNOSIS — I272 Pulmonary hypertension, unspecified: Secondary | ICD-10-CM | POA: Diagnosis not present

## 2023-11-23 DIAGNOSIS — Z8673 Personal history of transient ischemic attack (TIA), and cerebral infarction without residual deficits: Secondary | ICD-10-CM | POA: Diagnosis not present

## 2023-11-23 DIAGNOSIS — I69322 Dysarthria following cerebral infarction: Secondary | ICD-10-CM | POA: Diagnosis not present

## 2023-11-23 DIAGNOSIS — U099 Post covid-19 condition, unspecified: Secondary | ICD-10-CM | POA: Diagnosis present

## 2023-11-23 DIAGNOSIS — L89510 Pressure ulcer of right ankle, unstageable: Secondary | ICD-10-CM | POA: Diagnosis not present

## 2023-11-23 DIAGNOSIS — I5021 Acute systolic (congestive) heart failure: Secondary | ICD-10-CM | POA: Diagnosis not present

## 2023-11-23 DIAGNOSIS — L97921 Non-pressure chronic ulcer of unspecified part of left lower leg limited to breakdown of skin: Secondary | ICD-10-CM | POA: Diagnosis not present

## 2023-11-23 DIAGNOSIS — R0902 Hypoxemia: Secondary | ICD-10-CM | POA: Diagnosis not present

## 2023-11-23 DIAGNOSIS — R0602 Shortness of breath: Secondary | ICD-10-CM | POA: Diagnosis not present

## 2023-11-23 DIAGNOSIS — J440 Chronic obstructive pulmonary disease with acute lower respiratory infection: Secondary | ICD-10-CM | POA: Diagnosis not present

## 2023-11-23 DIAGNOSIS — I352 Nonrheumatic aortic (valve) stenosis with insufficiency: Secondary | ICD-10-CM | POA: Diagnosis present

## 2023-11-23 DIAGNOSIS — U071 COVID-19: Secondary | ICD-10-CM | POA: Diagnosis not present

## 2023-11-23 DIAGNOSIS — I130 Hypertensive heart and chronic kidney disease with heart failure and stage 1 through stage 4 chronic kidney disease, or unspecified chronic kidney disease: Secondary | ICD-10-CM | POA: Diagnosis not present

## 2023-11-23 DIAGNOSIS — L89312 Pressure ulcer of right buttock, stage 2: Secondary | ICD-10-CM | POA: Diagnosis not present

## 2023-11-23 DIAGNOSIS — K5900 Constipation, unspecified: Secondary | ICD-10-CM | POA: Diagnosis not present

## 2023-11-23 DIAGNOSIS — M25551 Pain in right hip: Secondary | ICD-10-CM | POA: Diagnosis not present

## 2023-11-23 DIAGNOSIS — R41841 Cognitive communication deficit: Secondary | ICD-10-CM | POA: Diagnosis not present

## 2023-11-23 DIAGNOSIS — I420 Dilated cardiomyopathy: Secondary | ICD-10-CM | POA: Diagnosis present

## 2023-11-23 DIAGNOSIS — G40901 Epilepsy, unspecified, not intractable, with status epilepticus: Secondary | ICD-10-CM | POA: Diagnosis not present

## 2023-11-23 DIAGNOSIS — I10 Essential (primary) hypertension: Secondary | ICD-10-CM | POA: Diagnosis not present

## 2023-11-23 DIAGNOSIS — N183 Chronic kidney disease, stage 3 unspecified: Secondary | ICD-10-CM | POA: Diagnosis not present

## 2023-11-23 DIAGNOSIS — E860 Dehydration: Secondary | ICD-10-CM | POA: Diagnosis present

## 2023-11-23 DIAGNOSIS — E1122 Type 2 diabetes mellitus with diabetic chronic kidney disease: Secondary | ICD-10-CM | POA: Diagnosis not present

## 2023-11-23 DIAGNOSIS — I110 Hypertensive heart disease with heart failure: Secondary | ICD-10-CM | POA: Diagnosis not present

## 2023-11-23 LAB — CBC
HCT: 36 % — ABNORMAL LOW (ref 39.0–52.0)
Hemoglobin: 11.3 g/dL — ABNORMAL LOW (ref 13.0–17.0)
MCH: 29.7 pg (ref 26.0–34.0)
MCHC: 31.4 g/dL (ref 30.0–36.0)
MCV: 94.5 fL (ref 80.0–100.0)
Platelets: 151 10*3/uL (ref 150–400)
RBC: 3.81 MIL/uL — ABNORMAL LOW (ref 4.22–5.81)
RDW: 17.2 % — ABNORMAL HIGH (ref 11.5–15.5)
WBC: 7.6 10*3/uL (ref 4.0–10.5)
nRBC: 0 % (ref 0.0–0.2)

## 2023-11-23 LAB — CBG MONITORING, ED
Glucose-Capillary: 138 mg/dL — ABNORMAL HIGH (ref 70–99)
Glucose-Capillary: 156 mg/dL — ABNORMAL HIGH (ref 70–99)

## 2023-11-23 LAB — GLUCOSE, CAPILLARY
Glucose-Capillary: 118 mg/dL — ABNORMAL HIGH (ref 70–99)
Glucose-Capillary: 162 mg/dL — ABNORMAL HIGH (ref 70–99)

## 2023-11-23 LAB — BASIC METABOLIC PANEL
Anion gap: 13 (ref 5–15)
BUN: 36 mg/dL — ABNORMAL HIGH (ref 8–23)
CO2: 22 mmol/L (ref 22–32)
Calcium: 8.7 mg/dL — ABNORMAL LOW (ref 8.9–10.3)
Chloride: 109 mmol/L (ref 98–111)
Creatinine, Ser: 2.01 mg/dL — ABNORMAL HIGH (ref 0.61–1.24)
GFR, Estimated: 35 mL/min — ABNORMAL LOW (ref >60)
Glucose, Bld: 182 mg/dL — ABNORMAL HIGH (ref 70–99)
Potassium: 4.4 mmol/L (ref 3.5–5.1)
Sodium: 144 mmol/L (ref 135–145)

## 2023-11-23 LAB — APTT: aPTT: >200 s (ref 24–36)

## 2023-11-23 LAB — HEPARIN LEVEL (UNFRACTIONATED): Heparin Unfractionated: >1.1 [IU]/mL — ABNORMAL HIGH (ref 0.30–0.70)

## 2023-11-23 LAB — TROPONIN I (HIGH SENSITIVITY): Troponin I (High Sensitivity): 51 ng/L — ABNORMAL HIGH (ref <18)

## 2023-11-23 MED ORDER — APIXABAN 5 MG PO TABS
5.0000 mg | ORAL_TABLET | Freq: Two times a day (BID) | ORAL | Status: DC
  Filled 2023-11-23: qty 1

## 2023-11-23 MED ORDER — PREDNISONE 20 MG PO TABS: 40.0000 mg | ORAL_TABLET | Freq: Every day | ORAL | Status: AC

## 2023-11-23 MED ORDER — HEPARIN (PORCINE) 25000 UT/250ML-% IV SOLN
650.0000 [IU]/h | INTRAVENOUS | Status: DC
  Administered 2023-11-23: 650 [IU]/h via INTRAVENOUS

## 2023-11-23 NOTE — Assessment & Plan Note (Addendum)
Cr 2.00 on admission, appears baseline is ~1.75. Stable this morning. Suspect it is slightly elevated in the setting of volume depletion given pt is hypernatremic with slightly elevated BUN. - Strict I/O - am BMP

## 2023-11-23 NOTE — Discharge Summary (Signed)
Family Medicine Teaching [facility] Discharge Summary  Patient name: Kyle [name] Medical record number: [id number] Date of birth: 23-Aug-1951 Age: 73 y.o. Gender: male Date of Admission: 11/22/2023  Date of Discharge: 11/23/23 Admitting Physician: Para March, DO  Primary Care Provider: Levin Erp, MD Consultants: none  Indication for Hospitalization: acute hypoxic respiratory failure  Discharge Diagnoses/Problem List:  Principal Problem for Admission: acute hypoxic respiratory failure Other Problems addressed during stay:  Principal Problem:   Acute hypoxic respiratory failure (HCC)  COVID Active Problems:   Hypernatremia   Chronic kidney disease, stage 3 unspecified (HCC)   Type 2 diabetes mellitus (HCC)   Elevated troponin   Right foot ulcer [facility])    Brief Hospital Course:  Kyle [name] is a 73 y.o.male with a 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 Medicine Teaching Service at [facility] for AHRF secondary to COVID. His hospital course is detailed below:  AHRF  COVID  COPD Exacerbation Presented with SOB and required NRB due to hypoxia to the 70s, oxygen requirement was at baseline prior to discharge of 4.5L. Symptoms were suspected to be secondary to him having COVID and being without his oxygen for prolonged amount of time at his facility.  Initially treated with azithromycin for possible COPD exacerbation however discontinued this after 2 doses.  Treated patient with DuoNebs, IV Solu-Medrol x1, and then started on prednisone 40 mg p.o.  Patient's symptoms were much improved at time of discharge.  Hypernatremia Na 149 upon admission, possible due to volume depletion. Patient was given two fluid boluses and BMP monitored closely. Upon discharge Na improved to 144  (normal).  Elevated troponins Troponins were elevated in the ED upon arrival. No subjective chest pain. EKG without ST elevation. Treated with heparin drip for 24 hours. Repeat EKG unremarkable and troponins trended down. Do not suspect this to be due to ACS.   Other chronic conditions were medically managed with home medications and formulary alternatives as necessary: R foot ulcer: wound care consulted A-fib: continued Eliquis HTN: Held amlodipine, hydralazine, clonidine, and imdur CKD3: Stable T2DM: CBGs stable in 100s, placed on sensitive SSI R foot ulcer, R hip ulcer: Wound care recommendations as below: FOOT: Wound type:full thickness wound; left dorsiflexion surface; arterial Pressure Injury POA: NA Measurement: less than 1cm  Wound bed:100% eschar  Drainage (amount, consistency, odor) none Periwound: intact  Dressing procedure/placement/frequency: Paint area with betadine, keep dry and stable in patient with PAD.  PCP Follow-up Recommendations: Please follow up on wounds Ensure pt is receiving his 4.5L O2 at facility   Disposition: back to [facility]  Discharge Condition: stable  Discharge Exam:  Vitals:   11/23/23 1255 11/23/23 1425  BP: (!) 151/104 (!) 143/73  Pulse: 80 72  Resp: (!) 22 18  Temp: 97.8 F (36.6 C)   SpO2: 95% (!) 85%   General: well appearing, NAD Cardiovascular: RRR, no m/r/g Respiratory: normal work of breathing on 4 L nasal cannula. Mild coarse breath sounds improved from yesterday Abdomen: Normal bowel sounds, soft, non-tender Extremities: Chronic R sided weakness residual from stroke. Wound to R hip and R foot as seen in media tab no sign of infection.  Significant Procedures: none  Significant Labs and Imaging:  Recent Labs  Lab 11/22/23 1219 11/23/23 0300  WBC 7.9 7.6  HGB 13.6 11.3*  HCT  42.5 36.0*  PLT 185 151   Recent Labs  Lab 11/22/23 1212 11/22/23 1845 11/23/23 0300  NA 149* 146* 144  K 4.6 3.8 4.4  CL 108  110 109  CO2 27 26 22   GLUCOSE 133* 114* 182*  BUN 26* 29* 36*  CREATININE 2.00* 1.95* 2.01*  CALCIUM 9.6 8.6* 8.7*  ALKPHOS 57  --   --   AST 34  --   --   ALT 18  --   --   ALBUMIN 3.3*  --   --    CXR 11/22/23 IMPRESSION: Chronically coarsened interstitial markings. No acute cardiopulmonary findings.  Results/Tests Pending at Time of Discharge: none  Discharge Medications:  Allergies as of 11/23/2023   No Known Allergies      Medication List     PAUSE taking these medications    amLODipine 10 MG tablet Wait to take this until your doctor or other care provider tells you to start again. Commonly known as: NORVASC Take 1 tablet (10 mg total) by mouth daily.       TAKE these medications    acetaminophen 325 MG tablet Commonly known as: TYLENOL Take 2 tablets (650 mg total) by mouth every 4 (four) hours as needed for mild pain (pain score 1-3). What changed: when to take this   bisacodyl 10 MG suppository Commonly known as: DULCOLAX Place 10 mg rectally daily as needed for moderate constipation.   bisacodyl 5 MG EC tablet Commonly known as: DULCOLAX Take 1 tablet (5 mg total) by mouth daily.   carvedilol 6.25 MG tablet Commonly known as: COREG TAKE 1 TABLET TWICE DAILY WITH MEALS (NEED MD APPOINTMENT)   Centrum Silver 50+Men Tabs Take 1 tablet by mouth daily with breakfast.   cloNIDine 0.2 MG tablet Commonly known as: CATAPRES Take 1 tablet (0.2 mg total) by mouth every 8 (eight) hours.   diclofenac Sodium 1 % Gel Commonly known as: VOLTAREN Apply 2 g topically 4 (four) times daily.   Eliquis 5 MG Tabs tablet Generic drug: apixaban TAKE 1 TABLET TWICE DAILY   gabapentin 600 MG tablet Commonly known as: NEURONTIN Take 600 mg by mouth at bedtime.   guaifenesin 100 MG/5ML syrup Commonly known as: ROBITUSSIN Take 200 mg by mouth in the morning and at bedtime.   hydrALAZINE 100 MG tablet Commonly known as: APRESOLINE Take 1 tablet (100 mg  total) by mouth 3 (three) times daily.   insulin aspart 100 UNIT/ML injection Commonly known as: novoLOG Inject 0-10 Units into the skin 3 (three) times daily before meals. Sliding Scale: If BG < 150, 0 units; BG 151-199, 2 units; BG 200-249, 4 units; BG 250-299, 6 units; BG 300-349, 8 units; 350-399, 10 units; > 400, notify MD   insulin degludec 100 UNIT/ML FlexTouch Pen Commonly known as: TRESIBA Inject 5 Units into the skin daily.   isosorbide mononitrate 30 MG 24 hr tablet Commonly known as: IMDUR TAKE 1 TABLET BY MOUTH ONCE DAILY.   levETIRAcetam 500 MG tablet Commonly known as: KEPPRA Take 1 tablet (500 mg total) by mouth 2 (two) times daily.   magnesium citrate Soln Take 1 Bottle by mouth once.   mirtazapine 7.5 MG tablet Commonly known as: REMERON Take 1 tablet (7.5 mg total) by mouth at bedtime.   pantoprazole 40 MG tablet Commonly known as: PROTONIX Take 1 tablet (40 mg total) by mouth daily.   polyethylene glycol 17 g packet Commonly known as: MIRALAX / GLYCOLAX Take 17 g by mouth  daily.   predniSONE 20 MG tablet Commonly known as: DELTASONE Take 2 tablets (40 mg total) by mouth daily with breakfast for 3 days. Start taking on: November 24, 2023   rosuvastatin 10 MG tablet Commonly known as: CRESTOR TAKE 1 TABLET AT BEDTIME   senna-docusate 8.6-50 MG tablet Commonly known as: Senokot-S Take 3 tablets by mouth 2 (two) times daily.   Trelegy Ellipta 200-62.5-25 MCG/ACT Aepb Generic drug: Fluticasone-Umeclidin-Vilant INHALE 1 PUFF EVERY DAY   triamcinolone cream 0.1 % Commonly known as: KENALOG Apply 1 Application topically 2 (two) times daily. Apply to affected area of right hand        Discharge Instructions: Please refer to Patient Instructions section of EMR for full details.  Patient was counseled important signs and symptoms that should prompt return to medical care, changes in medications, dietary instructions, activity restrictions, and follow  up appointments.   Follow-Up Appointments: Returning to SNF  Dannica Bickham, [place], DO 11/23/2023, 2:50 PM PGY-1, [facility] Family Medicine

## 2023-11-23 NOTE — Progress Notes (Signed)
Patient discharged to [facility] by [facility]. Patient belongings (clothes) sent with patient. Peripheral IV already taken out by day RN. Report called by day RN.

## 2023-11-23 NOTE — Consult Note (Signed)
WOC Nurse Consult Note: Reason for Consult: foot and hip wound Patient from SNF for SOB Requested images for the hip wound Patient with history of PAD, last ABI 03/25/23 right 0.80 and left 0.42. has been seen by [facility] in the past. No surgical intervention noted after last visit with [facility] in 2024  Wound type:full thickness wound; left dorsiflexion surface; arterial Pressure Injury POA: NA Measurement: less than 1cm  Wound bed:100% eschar  Drainage (amount, consistency, odor) none Periwound: intact  Dressing procedure/placement/frequency: Paint area with betadine, keep dry and stable in patient with PAD.  Pending image on the right hip wound.   Re consult if needed, will not follow at this time. Thanks  Cherika Jessie [employer], RN,CWOCN, CNS, CWON-AP [phone])

## 2023-11-23 NOTE — TOC Initial Note (Signed)
Transition of Care [facility]) - Initial/Assessment Note    Patient Details  Name: Kyle [name] MRN: [id number] Date of Birth: March 26, 1951  Transition of Care [facility]) CM/SW Contact:    Lorri Frederick, LCSW Phone Number: 11/23/2023, 3:04 PM  Clinical Narrative:   CSW informed by MD that pt cleared for DC to return to [place].  CSW spoke with [facility]: pt STR there, can return today without new auth.  Discussed pt is covid +, Joetta Manners can take with covid +.    Per PT, pt does require ambulance transport.  Pt oriented x1.  CSW spoke with pt wife [place] Lions by phone.  She was already aware of plan for return to [place] today and is in agreement.  Pt normally lives with Florence Lions at home.                   Expected Discharge Plan: Skilled Nursing Facility Barriers to Discharge: No Barriers Identified   Patient Goals and CMS Choice     Choice offered to / list presented to : Spouse (wife  Lions)      Expected Discharge Plan and Services In-house Referral: Clinical Social Work   Post Acute Care Choice: Skilled Nursing Facility Living arrangements for the past 2 months: Single Family Home                                      Prior Living Arrangements/Services Living arrangements for the past 2 months: Single Family Home Lives with:: Spouse Patient language and need for interpreter reviewed:: No        Need for Family Participation in Patient Care: Yes (Comment) Care giver support system in place?: Yes (comment) Current home services: Other (comment) (none) Criminal Activity/Legal Involvement Pertinent to Current Situation/Hospitalization: No - Comment as needed  Activities of Daily Living   ADL Screening (condition at time of admission) Independently performs ADLs?: No Does the patient have a NEW difficulty with bathing/dressing/toileting/self-feeding that is expected to last >3 days?: No (total care patient right side paralysis) Does the patient have a NEW  difficulty with getting in/out of bed, walking, or climbing stairs that is expected to last >3 days?: No (right side paralysis) Does the patient have a NEW difficulty with communication that is expected to last >3 days?: No Is the patient deaf or have difficulty hearing?: No Does the patient have difficulty seeing, even when wearing glasses/contacts?: No Does the patient have difficulty concentrating, remembering, or making decisions?: No  Permission Sought/Granted                  Emotional Assessment       Orientation: : Oriented to Self      Admission diagnosis:  COPD exacerbation (HCC) [J44.1] Acute hypoxic respiratory failure (HCC) [J96.01] Patient Active Problem List   Diagnosis Date Noted   Acute hypoxic respiratory failure (HCC)  COVID 11/22/2023   Hypernatremia 11/22/2023   Chronic kidney disease, stage 3 unspecified (HCC) 11/22/2023   Type 2 diabetes mellitus (HCC) 11/22/2023   Elevated troponin 11/22/2023   Right foot ulcer (HCC) 11/22/2023   Pressure injury of skin 10/05/2023   Dysphagia 10/05/2023   Hip pain, right 10/05/2023   Pseudoaneurysm (HCC) 09/10/2023   ICH (intracerebral hemorrhage) (HCC) 09/10/2023   Hemorrhagic stroke (HCC) 09/06/2023   Hand pain, right 10/12/2022   Chronic respiratory failure with hypoxia (HCC) 01/29/2022   Atrial flutter (HCC)  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HCC) 02/20/2020   Pulmonary nodules/lesions, multiple 01/29/2020   Anticoagulant long-term use 01/29/2020   SOB (shortness of breath)    Atrial fibrillation, chronic (HCC) 12/24/2019   Diabetes mellitus type 2, insulin dependent (HCC) 12/24/2019   Healthcare maintenance 07/14/2019   Medication management 07/14/2019   PAD (peripheral artery disease) (HCC) 04/22/2019   Atherosclerosis of artery of extremity with rest pain (HCC) 04/21/2019   Deformity of metatarsal bone of right foot 04/17/2019   COPD (chronic obstructive pulmonary disease) (HCC) 12/16/2018    Elevated PSA 12/27/2017   Protein-calorie malnutrition, severe (HCC) 12/18/2014   Aortoiliac occlusive disease (HCC) 12/16/2014   PVD (peripheral vascular disease) (HCC) 10/24/2014   Aortic stenosis 09/12/2014   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 [name] [address] Phone: [phone] Fax: [phone]  [facility] - [place], [place] - [address] [place] [address] Phone: [phone] Fax: [phone]  [facility] [address] [name] [address] Phone: [phone] Fax: [phone]     Social Drivers of Health (SDOH) Social History: SDOH Screenings   Food Insecurity: No Food Insecurity (11/23/2023)  Housing: Low Risk  (11/23/2023)  Transportation Needs: No Transportation Needs (11/23/2023)  Utilities: Not At Risk (11/23/2023)  Alcohol Screen: Low Risk  (03/05/2023)  Depression (PHQ2-9): Low Risk  (03/05/2023)  Financial Resource Strain: Low Risk  (03/05/2023)  Physical Activity: Sufficiently Active (03/05/2023)  Social Connections: Moderately Isolated (11/23/2023)  Stress: No Stress Concern Present (03/05/2023)  Tobacco Use: Medium Risk (11/22/2023)   SDOH Interventions:     Readmission Risk Interventions     No data to display

## 2023-11-23 NOTE — Progress Notes (Addendum)
Daily Progress Note Intern Pager: [phone]  Patient name: Kyle [name] Medical record number: [id number] Date of birth: 01/30/1951 Age: 73 y.o. Gender: male  Primary Care Provider: Levin Erp, MD Consultants: Cardiology curbsided Code Status: Full  Pt Overview and Major Events to Date:  1/27 - admitted  Assessment and Plan:  73yo male PMHx CVA with residual dysarthria and R sided deficits, COPD on 4.5L, iliac pseudoaneurysm, afib on eliquis, HFpEF, aortic insufficiency, PAD, CKD3, T2DM, PAH, pulmonary nodules presenting with acute hypoxic respiratory failure in setting of COVID, suspected COPD exacerbation. Pt was also found at his facility without his baseline 4.5L O2 for an unknown amount of time which likely contributed.  Assessment & Plan Acute hypoxic respiratory failure (HCC)  COVID On 4L at baseline, presented on 15L nonrebreather due to hypoxia into the 70s.  Now back on his baseline O2.  Suspect his hypoxia was due to being without his oxygen, his COVID infection.  However did begin empiric treatment for COPD exacerbation. - airborne precautions - Azithromycin 500mg  x2 days (1/27-1/28), discontinued today due to low suspicion of COPD exacerbation at this time - IV Solumedrol 40mg  on admission, prednisone 40mg  x4 days (1/28-) - Blood cultures pending - continue Eliquis 5mg  BID - Formulary equivalent for home trelegy - Duoneb q4h - Albuterol neb q4h PRN - Incentive spirometry, pulmonary toiletry - continuous pulse ox, goal 88-92% - daily weights - Strict I/O - Delirium, fall precautions - Turn patient q2h - PT/OT/SLP eval - am BMP, CBC Elevated troponin Troponins 80>107>150>176. Low suspicion for ACS given no chest pain and EKG with no new changes. Suspected in setting of CHF vs CKD vs sepsis, has been elevated in the past. Curbsided with cardiology overnight, said it would not be unreasonable to start heparin gtt x24 h and repeat trops/EKG this morning. EKG  this morning unremarkable. Troponin downtrended to 51 this morning. - heparin gtt discontinued Hypernatremia Sodium 149 on admission. Appears it was 145-150 in the beginning of December as well. Improved to 144 this morning. Suspect this is in the setting of volume depletion. S/p LR via EMS and 1/2NS bolus yesterday. - Strict I/O - am BMP Chronic kidney disease, stage 3 unspecified (HCC) Cr 2.00 on admission, appears baseline is ~1.75. Stable this morning. Suspect it is slightly elevated in the setting of volume depletion given pt is hypernatremic with slightly elevated BUN. - Strict I/O - am BMP Type 2 diabetes mellitus (HCC) Per chart review on 5U Tresiba and SSI. Last A1c 6.2 09/06/23. Anticipate CBGs will be elevated in setting of steroid use. CBGs in 100s overnight.  - sensitive SSI - 5 U Lantus - CBG monitoring 4 times daily, before meals and at bedtime Right foot ulcer (HCC) Ulcer to R foot, appears chronic with no sign of infection.  Also with right hip ulcer.  Photo in media tab - Consult wound care   Chronic and Stable Problems:  CHF, aortic insufficiency - echo 08/2023 with LVEF 55-60%, Grade 1 diastolic dysfunction, severe calcification of aortic valve with severe stenosis, dilated IVC. Does not appear volume overloaded on exam today Intracranial Hemorrhagic stroke - admitted 11/11-11/15 for hemorrhagic stroke, inpatient rehab from 11/15-12/11. Came to ED after fall out of bed on Eliquis, found to be severely hypertensive in 200s systolic, CT head with acute 7mm hemorrhage in left lentiform nuclei. Has residual R sided deficits and dysarthria.  A-fib - in sinus rhythm but tachycardic here, will restart home meds once  confirmed by facility, supposed to be taking Coreg and eliquis. Start elquis, hold coreg with low pressures Hx of Seizure: Continue keppra 500 BID HTN: Hold amlodipine 10 mg daily, hydralazine 100 mg TID, clonidine 0.2 mg q8h, imdur 30  daily COPD PAD Pulmonary hypertension   FEN/GI: Dysphagia 1 nectar thick PPx: heparin Dispo:SNF pending clinical improvement   Subjective:  NAEON. Feeling better this morning  Objective: Temp:  [97.6 F (36.4 C)-102.9 F (39.4 C)] 97.6 F (36.4 C) (01/28 0256) Pulse Rate:  [63-111] 63 (01/28 0630) Resp:  [10-25] 10 (01/28 0630) BP: (93-157)/(54-87) 143/67 (01/28 0630) SpO2:  [87 %-100 %] 94 % (01/28 0630) Weight:  [16 kg] 63 kg (01/27 1144) Physical Exam: General: well appearing, NAD Cardiovascular: RRR, no m/r/g Respiratory: normal work of breathing on 4 L nasal cannula. Mild coarse breath sounds improved from yesterday Abdomen: Normal bowel sounds, soft, non-tender Extremities: Chronic R sided weakness residual from stroke. Wound to R hip and R foot as seen in media tab no sign of infection.  Laboratory: Most recent CBC Lab Results  Component Value Date   WBC 7.6 11/23/2023   HGB 11.3 (L) 11/23/2023   HCT 36.0 (L) 11/23/2023   MCV 94.5 11/23/2023   PLT 151 11/23/2023   Most recent BMP    Latest Ref Rng & Units 11/23/2023    3:00 AM  BMP  Glucose 70 - 99 mg/dL 109   BUN 8 - 23 mg/dL 36   Creatinine 6.04 - 1.24 mg/dL 5.40   Sodium 981 - 191 mmol/L 144   Potassium 3.5 - 5.1 mmol/L 4.4   Chloride 98 - 111 mmol/L 109   CO2 22 - 32 mmol/L 22   Calcium 8.9 - 10.3 mg/dL 8.7    aPTT >478 Troponins 80>107>120>137  Imaging/Diagnostic Tests: CXR 11/22/23 IMPRESSION: Chronically coarsened interstitial markings. No acute cardiopulmonary findings.  Leeanne Butters, DO 11/23/2023, 7:24 AM  PGY-1, [facility] Family Medicine FPTS Intern pager: [phone], text pages welcome Secure chat group [facility] Teaching Service

## 2023-11-23 NOTE — Care Management CC44 (Addendum)
Condition Code 44 Documentation Completed  Patient Details  Name: Kyle [name] MRN: [id number] Date of Birth: [birth year]   Condition Code 44 given:  Yes Patient signature on Condition Code 44 notice:  Yes Documentation of 2 MD's agreement:  Yes Code 44 added to claim:  Yes  Patient oriented x1, unable to discuss or sign letter.  CSW spoke with patient wife Kyle [name], who was at home, spoke by phone, explained the form and she verbalized understanding.  Form was placed with pt paperwork for transport to [facility].    Lorri Frederick, LCSW 11/23/2023, 3:22 PM

## 2023-11-23 NOTE — TOC Transition Note (Signed)
Transition of Care [facility]) - Discharge Note   Patient Details  Name: Kyle [name] MRN: [id number] Date of Birth: Sep 07, 1951  Transition of Care [facility]) CM/SW Contact:  Lorri Frederick, LCSW Phone Number: 11/23/2023, 3:23 PM   Clinical Narrative:   Pt discharging to [facility], room 218.  RN call report to [phone].      Final next level of care: Skilled Nursing Facility Barriers to Discharge: No Barriers Identified   Patient Goals and CMS Choice     Choice offered to / list presented to : Spouse (wife [place] Lions)      Discharge Placement              Patient chooses bed at: [facility] Patient to be transferred to facility by: ptar Name of family member notified: wife [place] Lions Patient and family notified of of transfer: 11/23/23  Discharge Plan and Services Additional resources added to the After Visit Summary for   In-house Referral: Clinical Social Work   Post Acute Care Choice: Skilled Nursing Facility                               Social Drivers of Health (SDOH) Interventions SDOH Screenings   Food Insecurity: No Food Insecurity (11/23/2023)  Housing: Low Risk  (11/23/2023)  Transportation Needs: No Transportation Needs (11/23/2023)  Utilities: Not At Risk (11/23/2023)  Alcohol Screen: Low Risk  (03/05/2023)  Depression (PHQ2-9): Low Risk  (03/05/2023)  Financial Resource Strain: Low Risk  (03/05/2023)  Physical Activity: Sufficiently Active (03/05/2023)  Social Connections: Moderately Isolated (11/23/2023)  Stress: No Stress Concern Present (03/05/2023)  Tobacco Use: Medium Risk (11/22/2023)     Readmission Risk Interventions     No data to display

## 2023-11-23 NOTE — Assessment & Plan Note (Addendum)
Sodium 149 on admission. Appears it was 145-150 in the beginning of December as well. Improved to 144 this morning. Suspect this is in the setting of volume depletion. S/p LR via EMS and 1/2NS bolus yesterday. - Strict I/O - am BMP

## 2023-11-23 NOTE — Assessment & Plan Note (Addendum)
Ulcer to R foot, appears chronic with no sign of infection.  Also with right hip ulcer.  Photo in media tab - Consult wound care

## 2023-11-23 NOTE — Assessment & Plan Note (Addendum)
On 4L at baseline, presented on 15L nonrebreather due to hypoxia into the 70s.  Now back on his baseline O2.  Suspect his hypoxia was due to being without his oxygen, his COVID infection.  However did begin empiric treatment for COPD exacerbation. - airborne precautions - Azithromycin 500mg  x2 days (1/27-1/28), discontinued today due to low suspicion of COPD exacerbation at this time - IV Solumedrol 40mg  on admission, prednisone 40mg  x4 days (1/28-) - Blood cultures pending - continue Eliquis 5mg  BID - Formulary equivalent for home trelegy - Duoneb q4h - Albuterol neb q4h PRN - Incentive spirometry, pulmonary toiletry - continuous pulse ox, goal 88-92% - daily weights - Strict I/O - Delirium, fall precautions - Turn patient q2h - PT/OT/SLP eval - am BMP, CBC

## 2023-11-23 NOTE — Assessment & Plan Note (Addendum)
Troponins 80>107>150>176. Low suspicion for ACS given no chest pain and EKG with no new changes. Suspected in setting of CHF vs CKD vs sepsis, has been elevated in the past. Curbsided with cardiology overnight, said it would not be unreasonable to start heparin gtt x24 h and repeat trops/EKG this morning. EKG this morning unremarkable. Troponin downtrended to 51 this morning. - heparin gtt discontinued

## 2023-11-23 NOTE — ED Notes (Signed)
Bgl 156

## 2023-11-23 NOTE — Evaluation (Signed)
Clinical/Bedside Swallow Evaluation Patient Details  Name: Kyle [name] MRN: [id number] Date of Birth: 1951/10/11  Today's Date: 11/23/2023 Time: SLP Start Time (ACUTE ONLY): 1149 SLP Stop Time (ACUTE ONLY): 1159 SLP Time Calculation (min) (ACUTE ONLY): 10 min  Past Medical History:  Past Medical History:  Diagnosis Date   Aortic insufficiency    MODERATE WITH A BICUSPID AORTIC VAVLE   Arterial occlusive disease    MULTILEVEL   Atrial fibrillation (HCC)    CHF (congestive heart failure) (HCC)    Chronic bronchitis (HCC)    CKD (chronic kidney disease) stage 3, GFR 30-59 ml/min (HCC)    COPD (chronic obstructive pulmonary disease) (HCC)    Dilated cardiomyopathy (HCC)    WITH EJECTION FRACTION DOWN TO 20-25%--WITH CONGESTIVE HEART FAILURE   Edema    LOWER EXTREMETIES   Emphysema of lung (HCC)    Hypertension    Insulin dependent type 2 diabetes mellitus (HCC)    Normal coronary arteries 2009   Orthopnea    Peripheral arterial disease (HCC)    Pulmonary hypertension (HCC)    Past Surgical History:  Past Surgical History:  Procedure Laterality Date   ABDOMINAL AORTAGRAM N/A 11/05/2014   Procedure: ABDOMINAL Ronny Flurry;  Surgeon: Chuck Hint, MD;  Location: [facility] CATH LAB;  Service: Cardiovascular;  Laterality: N/A;   ABDOMINAL AORTOGRAM W/LOWER EXTREMITY Bilateral 04/21/2019   Procedure: ABDOMINAL AORTOGRAM W/LOWER EXTREMITY;  Surgeon: Chuck Hint, MD;  Location: [facility] INVASIVE CV LAB;  Service: Cardiovascular;  Laterality: Bilateral;   ABDOMINAL AORTOGRAM W/LOWER EXTREMITY Right 05/30/2021   Procedure: ABDOMINAL AORTOGRAM W/LOWER EXTREMITY;  Surgeon: Chuck Hint, MD;  Location: [facility] INVASIVE CV LAB;  Service: Cardiovascular;  Laterality: Right;   AORTA - BILATERAL FEMORAL ARTERY BYPASS GRAFT N/A 12/18/2014   Procedure: AORTOBIFEMORAL BYPASS GRAFT;  Surgeon: Chuck Hint, MD;  Location: [facility] OR;  Service: Vascular;  Laterality: N/A;   CARDIAC  CATHETERIZATION  2009   Nl Cors, EF 20%   COLONOSCOPY     ENDARTERECTOMY FEMORAL Left 12/18/2014   Procedure: ENDARTERECTOMY FEMORAL;  Surgeon: Chuck Hint, MD;  Location: [facility] OR;  Service: Vascular;  Laterality: Left;   FALSE ANEURYSM REPAIR Right 04/24/2019   Procedure: REPAIR OF RIGHT FEMORAL ARTERY PSEUDOANEURYSM;  Surgeon: Larina Earthly, MD;  Location: [facility] OR;  Service: Vascular;  Laterality: Right;   FEMORAL-POPLITEAL BYPASS GRAFT  02/21/2011   right   FEMORAL-TIBIAL BYPASS GRAFT Right 04/24/2019   Procedure: REDO BYPASS GRAFT RIGHT FEMORAL-TIBIAL ARTERY USING LEFT LEG VEIN & HEMASHIELD GOLD 8mm GRAFT;  Surgeon: Larina Earthly, MD;  Location: [facility] OR;  Service: Vascular;  Laterality: Right;   IR FLUORO GUIDE CV LINE RIGHT  12/28/2019   IR REMOVAL TUN CV CATH W/O FL  01/11/2020   IR US GUIDE VASC ACCESS RIGHT  12/28/2019   OTHER SURGICAL HISTORY     POST RIGHT FEMOROPOPLITEAL BYPASS GRAFT   PERIPHERAL VASCULAR CATHETERIZATION  11/05/2014   Procedure: LOWER EXTREMITY ANGIOGRAPHY;  Surgeon: Chuck Hint, MD;  Location: [facility] CATH LAB;  Service: Cardiovascular;;   VEIN HARVEST Left 04/24/2019   Procedure: LEFT LEG SAPHENOUS VEIN HARVEST;  Surgeon: Larina Earthly, MD;  Location: [facility] OR;  Service: Vascular;  Laterality: Left;   HPI:  73 yo male presents to [facility] ED on 11/22/23 for hypoxia .Pt also found to be positive for Covid. PMH includes dilated cardiomyopathy, HF, COPD on 4.5L baseline, HTN, PAD, hx of fem pop bypass 2010, aortobifemoral bypass 2016, L thalamic  ICH. Was followed by SLP for dysphagia/dysarthria during acute admission November 2024 then during CIR stay in December. He was D/Cd on a dysphagia 1 diet with nectar-thick liquids. Per wife, over time was advanced to regular diet/thin liquids at [facility] SNF.    Assessment / Plan / Recommendation  Clinical Impression  Pt participated in a clinical swallowing evaluation and presented with functional swallowing. He demonstrated  thorough mastication with mild retention and spillage right side (C/W baseline sensory deficits). He drank sequential sips of thin liquid without difficulty. There were no s/s of aspiration. Pt states he has been eating regular solids, drinking thin liquids at the SNF. Recommend advancing diet to the same. No SLP f/u needed. Will sign off. SLP Visit Diagnosis: Dysphagia, unspecified (R13.10)    Aspiration Risk  No limitations    Diet Recommendation   Thin;Age appropriate regular  Medication Administration: Whole meds with liquid    Other  Recommendations Oral Care Recommendations: Oral care BID    Recommendations for follow up therapy are one component of a multi-disciplinary discharge planning process, led by the attending physician.  Recommendations may be updated based on patient status, additional functional criteria and insurance authorization.  Follow up Recommendations No SLP follow up        Swallow Study   General Date of Onset: 11/22/23 HPI: 73 yo male presents to [facility] ED on 11/22/23 for hypoxia .Pt also found to be positive for Covid. PMH includes dilated cardiomyopathy, HF, COPD on 4.5L baseline, HTN, PAD, hx of fem pop bypass 2010, aortobifemoral bypass 2016, L thalamic ICH. Was followed by SLP for dysphagia/dysarthria during acute admission November 2024 then during CIR stay in December. He was D/Cd on a dysphagia 1 diet with nectar-thick liquids. Per wife, over time was advanced to regular diet/thin liquids at [facility] SNF. Type of Study: Bedside Swallow Evaluation Previous Swallow Assessment: see HPI Diet Prior to this Study: Dysphagia 1 (pureed);Mildly thick liquids (Level 2, nectar thick) Temperature Spikes Noted: No Respiratory Status: Nasal cannula History of Recent Intubation: No Behavior/Cognition: Alert;Cooperative;Pleasant mood Oral Cavity Assessment: Within Functional Limits Oral Care Completed by SLP: No Oral Cavity - Dentition: Dentures, top;Dentures,  bottom Vision: Functional for self-feeding Self-Feeding Abilities: Able to feed self;Needs assist Patient Positioning: Upright in bed Baseline Vocal Quality: Normal Volitional Cough: Strong Volitional Swallow: Able to elicit    Oral/Motor/Sensory Function Overall Oral Motor/Sensory Function: Within functional limits   Ice Chips Ice chips: Within functional limits   Thin Liquid Thin Liquid: Within functional limits    Nectar Thick Nectar Thick Liquid: Not tested   Honey Thick Honey Thick Liquid: Not tested   Puree Puree: Within functional limits   Solid     Solid: Within functional limits      Blenda Mounts Laurice 11/23/2023,12:06 PM   Marchelle Folks L. Samson Frederic, MA CCC/SLP Clinical Specialist - Acute Care SLP Acute Rehabilitation Services Office number [phone]

## 2023-11-23 NOTE — Plan of Care (Signed)
Problem: Education: Goal: Knowledge of risk factors and measures for prevention of condition will improve Outcome: Progressing   Problem: Coping: Goal: Psychosocial and spiritual needs will be supported Outcome: Progressing   Problem: Respiratory: Goal: Will maintain a patent airway Outcome: Progressing

## 2023-11-23 NOTE — Care Management Obs Status (Signed)
MEDICARE OBSERVATION STATUS NOTIFICATION   Patient Details  Name: Kyle [name] MRN: [id number] Date of Birth: 11/12/1950   Medicare Observation Status Notification Given:  Yes    Lorri Frederick, LCSW 11/23/2023, 3:22 PM

## 2023-11-23 NOTE — Discharge Instructions (Signed)
Dear Kyle [name],   Thank you for letting us participate in your care!  We treated you for low oxygen due to COVID.  We also think it was due to you being without your oxygen at your facility.  We are sending you home with steroids to keep taking.  Please return to the ED if you become more short of breath, require more oxygen, develop chest pain, or any additional concerning symptoms.  POST-HOSPITAL & CARE INSTRUCTIONS We recommend following up with your PCP within 1 week from being discharged from the hospital. Please let PCP/Specialists know of any changes in medications that were made which you will be able to see in the medications section of this packet.  DOCTOR'S APPOINTMENTS & FOLLOW UP Future Appointments  Date Time Provider Department Center  01/27/2024  9:00 AM [facility]  01/27/2024  9:30 AM [facility]  01/27/2024 10:00 AM [facility]  01/27/2024 11:20 AM Victorino Sparrow, MD [facility]  05/24/2024  1:45 PM Lexine Baton Shanda Bumps, NP [facility] None     Thank you for choosing [facility]! Take care and be well!  Family Medicine Teaching Service Inpatient Team [place]  [facility]  [address] [place], [name] [address] [phone]

## 2023-11-23 NOTE — Evaluation (Signed)
Occupational Therapy Evaluation Patient Details Name: Kyle [name] MRN: [id number] DOB: [birth year] Today's Date: 11/23/2023   History of Present Illness 73 yo male presents to [facility] ED on 11/22/23 for hypoxia.Pt also found to be positive for Covid. PMH includes dilated cardiomyopathy, HF, COPD on 4.5L baseline, HTN, PAD, hx of fem pop bypass 2010, aortobifemoral bypass 2016, L thalamic ICH.   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 Pt needing a significant amount of assist for bed mobility and standing efforts, suggest +2 for attempts of gait and transfer. Pt  requiring total to setup assist for ADLs using his more functional LUE. Skin breakdown noted in R palm, pt believes it is happening to LUE/LLE as well due to it seeming scaly, WOC already following pt. OT to continue following pt acutely to help progress and return to next level of care. Patient would benefit from post acute skilled rehab facility with <3 hours of therapy and 24/7 support       If plan is discharge home, recommend the following: A lot of help with walking and/or transfers;Two people to help with walking and/or transfers;A lot of help with bathing/dressing/bathroom;Assistance with cooking/housework    Functional Status Assessment  Patient has had a recent decline in their functional status and demonstrates the ability to make significant improvements in function in a reasonable and predictable amount of time.  Equipment Recommendations  Other (comment) (defer)    Recommendations for Other Services       Precautions / Restrictions Precautions Precautions: Fall Precaution Comments: R hemi Restrictions Weight Bearing Restrictions Per Provider Order: No Other Position/Activity Restrictions: has AFOs and hand splints at SNF, wife reports she can bring them if staying in hosptial for a bit      Mobility Bed Mobility Overal bed  mobility: Needs Assistance Bed Mobility: Supine to Sit, Sit to Supine     Supine to sit: Mod assist Sit to supine: Mod assist   General bed mobility comments: asisst with Flaccid Limbs    Transfers Overall transfer level: Needs assistance Equipment used: 1 person hand held assist Transfers: Sit to/from Stand Sit to Stand: Max assist           General transfer comment: R knee block and protection of RUE      Balance Overall balance assessment: Needs assistance Sitting-balance support: Single extremity supported, Feet supported Sitting balance-Leahy Scale: Poor Sitting balance - Comments: poor to fair, intermittent L lean Postural control: Left lateral lean   Standing balance-Leahy Scale: Zero                             ADL either performed or assessed with clinical judgement   ADL Overall ADL's : Needs assistance/impaired Eating/Feeding: Set up;Sitting Eating/Feeding Details (indicate cue type and reason): using LUE Grooming: Sitting;Minimal assistance   Upper Body Bathing: Sitting;Moderate assistance   Lower Body Bathing: Sitting/lateral leans;Maximal assistance   Upper Body Dressing : Sitting;Maximal assistance   Lower Body Dressing: Sitting/lateral leans;Total assistance Lower Body Dressing Details (indicate cue type and reason): doff/don socks Toilet Transfer: Maximal assistance;+2 for safety/equipment;Stand-pivot Toilet Transfer Details (indicate cue type and reason): anticipated based on STS efforts Toileting- Clothing Manipulation and Hygiene: Maximal assistance;+2 for safety/equipment;+2 for physical assistance               Vision Baseline Vision/History: 0 No visual deficits Patient  Visual Report: No change from baseline       Perception         Praxis         Pertinent Vitals/Pain Pain Assessment Pain Assessment: Faces Faces Pain Scale: Hurts little more Pain Location: R bottom wound Pain Descriptors / Indicators:  Aching, Discomfort Pain Intervention(s): Monitored during session, Limited activity within patient's tolerance, Repositioned     Extremity/Trunk Assessment Upper Extremity Assessment Upper Extremity Assessment: LUE deficits/detail;RUE deficits/detail;Generalized weakness RUE Deficits / Details: R flaccid hemi, some shoulder functioning  (shrugging) still intact. Sensatin intact per pt report. Skin breakdown on R palm LUE Deficits / Details: generally weak 3/5 overall. ROM WFL   Lower Extremity Assessment Lower Extremity Assessment: Defer to PT evaluation       Communication Communication Communication: No apparent difficulties Cueing Techniques: Verbal cues   Cognition Arousal: Alert Behavior During Therapy: WFL for tasks assessed/performed Overall Cognitive Status: Within Functional Limits for tasks assessed                                       General Comments  VSS on 4.5L per monitor, brief episode of vtach HR in the 90s    Exercises Other Exercises Other Exercises: PNF D1/D2 flexion/extension x6 reps each RUE   Shoulder Instructions      Home Living Family/patient expects to be discharged to:: Skilled nursing facility                                 Additional Comments: Uses 4.5L at baseline      Prior Functioning/Environment               Mobility Comments: Pt working on standing and ambulation with +2 assist PT/OT and chair follow ADLs Comments: Pt has assist with all ADLs        OT Problem List: Impaired balance (sitting and/or standing);Decreased strength;Impaired tone;Impaired UE functional use;Pain      OT Treatment/Interventions: Self-care/ADL training;Therapeutic exercise;DME and/or AE instruction;Energy conservation;Cognitive remediation/compensation;Balance training;Therapeutic activities;Patient/family education    OT Goals(Current goals can be found in the care plan section) Acute Rehab OT Goals Patient Stated  Goal: Get stronger OT Goal Formulation: With patient/family Time For Goal Achievement: 12/07/23 Potential to Achieve Goals: Good ADL Goals Pt Will Perform Grooming: with set-up;sitting Pt Will Perform Upper Body Bathing: sitting;with min assist Pt Will Transfer to Toilet: with mod assist;with +2 assist;bedside commode;stand pivot transfer Pt/caregiver will Perform Home Exercise Program: Increased ROM;Increased strength;Right Upper extremity;With written HEP provided  OT Frequency: Min 1X/week    Co-evaluation              AM-PAC OT "6 Clicks" Daily Activity     Outcome Measure Help from another person eating meals?: A Little Help from another person taking care of personal grooming?: A Little Help from another person toileting, which includes using toliet, bedpan, or urinal?: A Lot Help from another person bathing (including washing, rinsing, drying)?: A Lot Help from another person to put on and taking off regular upper body clothing?: A Lot Help from another person to put on and taking off regular lower body clothing?: Total 6 Click Score: 13   End of Session Equipment Utilized During Treatment: Gait belt Nurse Communication: Mobility status  Activity Tolerance: Patient tolerated treatment well Patient left: in bed;with call bell/phone within reach;with  family/visitor present  OT Visit Diagnosis: Other (comment) (hypoxia)                Time: 8295-6213 OT Time Calculation (min): 34 min Charges:  OT General Charges $OT Visit: 1 Visit OT Evaluation $OT Eval Moderate Complexity: 1 Mod OT Treatments $Therapeutic Activity: 8-22 mins  11/23/2023  AB, OTR/L  Acute Rehabilitation Services  Office: [phone]   Tristan Schroeder 11/23/2023, 11:29 AM

## 2023-11-23 NOTE — Assessment & Plan Note (Signed)
" >>  ASSESSMENT AND PLAN FOR T2DM (TYPE 2 DIABETES MELLITUS) (HCC) WRITTEN ON 11/23/2023  7:43 AM BY EVERHART, KIRSTIE, DO  Per chart review on 5U Tresiba  and SSI. Last A1c 6.2 09/06/23. Anticipate CBGs will be elevated in setting of steroid use. CBGs in 100s overnight.  - sensitive SSI - 5 U Lantus  - CBG monitoring 4 times daily, before meals and at bedtime "

## 2023-11-23 NOTE — Assessment & Plan Note (Addendum)
Per chart review on 5U Tresiba and SSI. Last A1c 6.2 09/06/23. Anticipate CBGs will be elevated in setting of steroid use. CBGs in 100s overnight.  - sensitive SSI - 5 U Lantus - CBG monitoring 4 times daily, before meals and at bedtime

## 2023-11-23 NOTE — Evaluation (Addendum)
Physical Therapy Evaluation Patient Details Name: Kyle [name] MRN: [id number] DOB: Jan 06, 1951 Today's Date: 11/23/2023  History of Present Illness  73 yo male presents to [facility] ED on 11/22/23 for hypoxia.Pt also found to be positive for Covid. PMH includes dilated cardiomyopathy, HF, COPD on 4.5L baseline, HTN, PAD, hx of fem pop bypass 2010, aortobifemoral bypass 2016, L thalamic ICH.  Clinical Impression  Patient received in bed. He is pleasant and agreeable to PT assessment. Patient demonstrates R side flaccid from prior CVA in November 2024. He requires max A for bed mobility. Patient demonstrates good sitting balance once positioned with B feet on floor. We did not attempt standing due to lack of assistance. Patient will continue to benefit from skilled PT to improve functional independence and safety.          If plan is discharge home, recommend the following: A lot of help with walking and/or transfers;A lot of help with bathing/dressing/bathroom   Can travel by private vehicle   No    Equipment Recommendations None recommended by PT  Recommendations for Other Services       Functional Status Assessment Patient has had a recent decline in their functional status and demonstrates the ability to make significant improvements in function in a reasonable and predictable amount of time.     Precautions / Restrictions Precautions Precautions: Fall Precaution Comments: R hemi Restrictions Weight Bearing Restrictions Per Provider Order: No Other Position/Activity Restrictions: has AFOs and hand splints at SNF, wife reports she can bring them if staying in hosptial for a bit      Mobility  Bed Mobility Overal bed mobility: Needs Assistance Bed Mobility: Rolling, Supine to Sit, Sit to Supine Rolling: Mod assist   Supine to sit: Max assist, HOB elevated, Used rails Sit to supine: Max assist   General bed mobility comments: Required mod A to raise trunk to seated position. Max  assist to scoot around to edge of bed with feet on floor.    Transfers                   General transfer comment: Did not attempt with +1 assist for safety. Will need +2 for transfers.    Ambulation/Gait               General Gait Details: unable/unsafe to attempt with +1 assist.  Stairs            Wheelchair Mobility     Tilt Bed    Modified Rankin (Stroke Patients Only)       Balance Overall balance assessment: Needs assistance Sitting-balance support: Feet supported Sitting balance-Leahy Scale: Good Sitting balance - Comments: Good static sitting balance once positioned                                     Pertinent Vitals/Pain Pain Assessment Pain Assessment: Faces Faces Pain Scale: Hurts a little bit Pain Location: R bottom wound, R UE with mobility Pain Descriptors / Indicators: Discomfort, Grimacing Pain Intervention(s): Monitored during session, Repositioned    Home Living Family/patient expects to be discharged to:: Skilled nursing facility                   Additional Comments: Uses 4.5L at baseline    Prior Function Prior Level of Function : Needs assist       Physical Assist : Mobility (physical);ADLs (physical) Mobility (physical): Bed  mobility;Transfers;Gait ADLs (physical): Grooming;Bathing;Dressing;Toileting;IADLs Mobility Comments: Pt working on standing and ambulation with +2 assist PT/OT and chair follow ADLs Comments: Pt has assist with all ADLs     Extremity/Trunk Assessment   Upper Extremity Assessment Upper Extremity Assessment: Defer to OT evaluation RUE Deficits / Details: R flaccid hemi, some shoulder functioning  (shrugging) still intact. Sensatin intact per pt report. Skin breakdown on R palm LUE Deficits / Details: generally weak 3/5 overall. ROM WFL    Lower Extremity Assessment Lower Extremity Assessment: RLE deficits/detail;LLE deficits/detail RLE Deficits / Details:  flaccid LLE Deficits / Details: generalized weakness LLE Coordination: decreased gross motor    Cervical / Trunk Assessment Cervical / Trunk Assessment: Normal  Communication   Communication Communication: No apparent difficulties Cueing Techniques: Verbal cues  Cognition Arousal: Alert Behavior During Therapy: WFL for tasks assessed/performed Overall Cognitive Status: Within Functional Limits for tasks assessed                                 General Comments: very pleasant        General Comments General comments (skin integrity, edema, etc.): VSS on 4.5L per monitor, brief episode of vtach HR in the 90s    Exercises     Assessment/Plan    PT Assessment Patient needs continued PT services  PT Problem List Decreased strength;Decreased activity tolerance;Decreased balance;Decreased mobility;Decreased skin integrity;Cardiopulmonary status limiting activity       PT Treatment Interventions DME instruction;Functional mobility training;Therapeutic activities;Therapeutic exercise;Balance training;Patient/family education;Wheelchair mobility training    PT Goals (Current goals can be found in the Care Plan section)  Acute Rehab PT Goals Patient Stated Goal: to return to rehab PT Goal Formulation: With patient Time For Goal Achievement: 11/26/23 Potential to Achieve Goals: Good    Frequency Min 1X/week     Co-evaluation               AM-PAC PT "6 Clicks" Mobility  Outcome Measure Help needed turning from your back to your side while in a flat bed without using bedrails?: A Lot Help needed moving from lying on your back to sitting on the side of a flat bed without using bedrails?: A Lot Help needed moving to and from a bed to a chair (including a wheelchair)?: A Lot Help needed standing up from a chair using your arms (e.g., wheelchair or bedside chair)?: A Lot Help needed to walk in hospital room?: Total Help needed climbing 3-5 steps with a  railing? : Total 6 Click Score: 10    End of Session Equipment Utilized During Treatment: Oxygen Activity Tolerance: Patient tolerated treatment well Patient left: in bed;with call bell/phone within reach Nurse Communication: Mobility status PT Visit Diagnosis: Other abnormalities of gait and mobility (R26.89);Muscle weakness (generalized) (M62.81);Difficulty in walking, not elsewhere classified (R26.2);Hemiplegia and hemiparesis Hemiplegia - Right/Left: Right Hemiplegia - dominant/non-dominant: Dominant Hemiplegia - caused by: Nontraumatic intracerebral hemorrhage    Time: 0865-7846 PT Time Calculation (min) (ACUTE ONLY): 28 min   Charges:   PT Evaluation $PT Eval Moderate Complexity: 1 Mod PT Treatments $Therapeutic Activity: 8-22 mins PT General Charges $$ ACUTE PT VISIT: 1 Visit         Blakeley Scheier, PT, GCS 11/23/23,2:40 PM

## 2023-11-23 NOTE — Progress Notes (Signed)
PT Cancellation Note  Patient Details Name: NIKOLAOS MADDOCKS MRN: [id number] DOB: 05-Dec-1950   Cancelled Treatment:    Reason Eval/Treat Not Completed: Patient at procedure or test/unavailable. Patient with OT in room just finishing assessment. Will re-attempt later to assess for PT once patient has rested.    Rebecah Dangerfield 11/23/2023, 10:49 AM

## 2023-11-24 DIAGNOSIS — M25551 Pain in right hip: Secondary | ICD-10-CM | POA: Diagnosis not present

## 2023-11-24 DIAGNOSIS — F32A Depression, unspecified: Secondary | ICD-10-CM | POA: Diagnosis not present

## 2023-11-24 DIAGNOSIS — I619 Nontraumatic intracerebral hemorrhage, unspecified: Secondary | ICD-10-CM | POA: Diagnosis not present

## 2023-11-24 DIAGNOSIS — K5900 Constipation, unspecified: Secondary | ICD-10-CM | POA: Diagnosis not present

## 2023-11-24 DIAGNOSIS — E785 Hyperlipidemia, unspecified: Secondary | ICD-10-CM | POA: Diagnosis not present

## 2023-11-24 DIAGNOSIS — J449 Chronic obstructive pulmonary disease, unspecified: Secondary | ICD-10-CM | POA: Diagnosis not present

## 2023-11-24 DIAGNOSIS — G40901 Epilepsy, unspecified, not intractable, with status epilepticus: Secondary | ICD-10-CM | POA: Diagnosis not present

## 2023-11-24 DIAGNOSIS — I5021 Acute systolic (congestive) heart failure: Secondary | ICD-10-CM | POA: Diagnosis not present

## 2023-11-24 DIAGNOSIS — I110 Hypertensive heart disease with heart failure: Secondary | ICD-10-CM | POA: Diagnosis not present

## 2023-11-25 ENCOUNTER — Other Ambulatory Visit: Payer: Self-pay | Admitting: Cardiology

## 2023-11-25 DIAGNOSIS — M25551 Pain in right hip: Secondary | ICD-10-CM | POA: Diagnosis not present

## 2023-11-25 DIAGNOSIS — I110 Hypertensive heart disease with heart failure: Secondary | ICD-10-CM | POA: Diagnosis not present

## 2023-11-25 DIAGNOSIS — L97418 Non-pressure chronic ulcer of right heel and midfoot with other specified severity: Secondary | ICD-10-CM | POA: Diagnosis not present

## 2023-11-25 DIAGNOSIS — L97318 Non-pressure chronic ulcer of right ankle with other specified severity: Secondary | ICD-10-CM | POA: Diagnosis not present

## 2023-11-25 DIAGNOSIS — E785 Hyperlipidemia, unspecified: Secondary | ICD-10-CM | POA: Diagnosis not present

## 2023-11-25 DIAGNOSIS — F32A Depression, unspecified: Secondary | ICD-10-CM | POA: Diagnosis not present

## 2023-11-25 DIAGNOSIS — K5900 Constipation, unspecified: Secondary | ICD-10-CM | POA: Diagnosis not present

## 2023-11-25 DIAGNOSIS — G40901 Epilepsy, unspecified, not intractable, with status epilepticus: Secondary | ICD-10-CM | POA: Diagnosis not present

## 2023-11-25 DIAGNOSIS — L97921 Non-pressure chronic ulcer of unspecified part of left lower leg limited to breakdown of skin: Secondary | ICD-10-CM | POA: Diagnosis not present

## 2023-11-25 DIAGNOSIS — I619 Nontraumatic intracerebral hemorrhage, unspecified: Secondary | ICD-10-CM | POA: Diagnosis not present

## 2023-11-25 DIAGNOSIS — I5021 Acute systolic (congestive) heart failure: Secondary | ICD-10-CM | POA: Diagnosis not present

## 2023-11-25 DIAGNOSIS — J449 Chronic obstructive pulmonary disease, unspecified: Secondary | ICD-10-CM | POA: Diagnosis not present

## 2023-11-25 DIAGNOSIS — L97911 Non-pressure chronic ulcer of unspecified part of right lower leg limited to breakdown of skin: Secondary | ICD-10-CM | POA: Diagnosis not present

## 2023-11-26 DIAGNOSIS — E785 Hyperlipidemia, unspecified: Secondary | ICD-10-CM | POA: Diagnosis not present

## 2023-11-26 DIAGNOSIS — I5021 Acute systolic (congestive) heart failure: Secondary | ICD-10-CM | POA: Diagnosis not present

## 2023-11-26 DIAGNOSIS — F32A Depression, unspecified: Secondary | ICD-10-CM | POA: Diagnosis not present

## 2023-11-26 DIAGNOSIS — J449 Chronic obstructive pulmonary disease, unspecified: Secondary | ICD-10-CM | POA: Diagnosis not present

## 2023-11-26 DIAGNOSIS — I619 Nontraumatic intracerebral hemorrhage, unspecified: Secondary | ICD-10-CM | POA: Diagnosis not present

## 2023-11-26 DIAGNOSIS — G40901 Epilepsy, unspecified, not intractable, with status epilepticus: Secondary | ICD-10-CM | POA: Diagnosis not present

## 2023-11-26 DIAGNOSIS — M25551 Pain in right hip: Secondary | ICD-10-CM | POA: Diagnosis not present

## 2023-11-26 DIAGNOSIS — K5900 Constipation, unspecified: Secondary | ICD-10-CM | POA: Diagnosis not present

## 2023-11-26 DIAGNOSIS — I10 Essential (primary) hypertension: Secondary | ICD-10-CM | POA: Diagnosis not present

## 2023-11-27 LAB — CULTURE, BLOOD (ROUTINE X 2)
Culture: NO GROWTH
Culture: NO GROWTH
Special Requests: ADEQUATE

## 2023-11-29 DIAGNOSIS — G40901 Epilepsy, unspecified, not intractable, with status epilepticus: Secondary | ICD-10-CM | POA: Diagnosis not present

## 2023-11-29 DIAGNOSIS — I619 Nontraumatic intracerebral hemorrhage, unspecified: Secondary | ICD-10-CM | POA: Diagnosis not present

## 2023-11-29 DIAGNOSIS — M25551 Pain in right hip: Secondary | ICD-10-CM | POA: Diagnosis not present

## 2023-11-29 DIAGNOSIS — E785 Hyperlipidemia, unspecified: Secondary | ICD-10-CM | POA: Diagnosis not present

## 2023-11-29 DIAGNOSIS — J449 Chronic obstructive pulmonary disease, unspecified: Secondary | ICD-10-CM | POA: Diagnosis not present

## 2023-11-29 DIAGNOSIS — I5021 Acute systolic (congestive) heart failure: Secondary | ICD-10-CM | POA: Diagnosis not present

## 2023-11-29 DIAGNOSIS — F32A Depression, unspecified: Secondary | ICD-10-CM | POA: Diagnosis not present

## 2023-11-29 DIAGNOSIS — I10 Essential (primary) hypertension: Secondary | ICD-10-CM | POA: Diagnosis not present

## 2023-11-29 DIAGNOSIS — K5900 Constipation, unspecified: Secondary | ICD-10-CM | POA: Diagnosis not present

## 2023-11-30 DIAGNOSIS — J449 Chronic obstructive pulmonary disease, unspecified: Secondary | ICD-10-CM | POA: Diagnosis not present

## 2023-11-30 DIAGNOSIS — L97911 Non-pressure chronic ulcer of unspecified part of right lower leg limited to breakdown of skin: Secondary | ICD-10-CM | POA: Diagnosis not present

## 2023-11-30 DIAGNOSIS — L97921 Non-pressure chronic ulcer of unspecified part of left lower leg limited to breakdown of skin: Secondary | ICD-10-CM | POA: Diagnosis not present

## 2023-11-30 DIAGNOSIS — I5021 Acute systolic (congestive) heart failure: Secondary | ICD-10-CM | POA: Diagnosis not present

## 2023-11-30 DIAGNOSIS — I110 Hypertensive heart disease with heart failure: Secondary | ICD-10-CM | POA: Diagnosis not present

## 2023-11-30 DIAGNOSIS — E785 Hyperlipidemia, unspecified: Secondary | ICD-10-CM | POA: Diagnosis not present

## 2023-11-30 DIAGNOSIS — M25551 Pain in right hip: Secondary | ICD-10-CM | POA: Diagnosis not present

## 2023-11-30 DIAGNOSIS — L97318 Non-pressure chronic ulcer of right ankle with other specified severity: Secondary | ICD-10-CM | POA: Diagnosis not present

## 2023-11-30 DIAGNOSIS — L97418 Non-pressure chronic ulcer of right heel and midfoot with other specified severity: Secondary | ICD-10-CM | POA: Diagnosis not present

## 2023-11-30 DIAGNOSIS — G40901 Epilepsy, unspecified, not intractable, with status epilepticus: Secondary | ICD-10-CM | POA: Diagnosis not present

## 2023-11-30 DIAGNOSIS — I619 Nontraumatic intracerebral hemorrhage, unspecified: Secondary | ICD-10-CM | POA: Diagnosis not present

## 2023-11-30 DIAGNOSIS — F32A Depression, unspecified: Secondary | ICD-10-CM | POA: Diagnosis not present

## 2023-11-30 DIAGNOSIS — K5900 Constipation, unspecified: Secondary | ICD-10-CM | POA: Diagnosis not present

## 2023-12-01 DIAGNOSIS — K5900 Constipation, unspecified: Secondary | ICD-10-CM | POA: Diagnosis not present

## 2023-12-01 DIAGNOSIS — F32A Depression, unspecified: Secondary | ICD-10-CM | POA: Diagnosis not present

## 2023-12-01 DIAGNOSIS — G40901 Epilepsy, unspecified, not intractable, with status epilepticus: Secondary | ICD-10-CM | POA: Diagnosis not present

## 2023-12-01 DIAGNOSIS — E785 Hyperlipidemia, unspecified: Secondary | ICD-10-CM | POA: Diagnosis not present

## 2023-12-01 DIAGNOSIS — I110 Hypertensive heart disease with heart failure: Secondary | ICD-10-CM | POA: Diagnosis not present

## 2023-12-01 DIAGNOSIS — J449 Chronic obstructive pulmonary disease, unspecified: Secondary | ICD-10-CM | POA: Diagnosis not present

## 2023-12-01 DIAGNOSIS — I5021 Acute systolic (congestive) heart failure: Secondary | ICD-10-CM | POA: Diagnosis not present

## 2023-12-01 DIAGNOSIS — M25551 Pain in right hip: Secondary | ICD-10-CM | POA: Diagnosis not present

## 2023-12-01 DIAGNOSIS — I619 Nontraumatic intracerebral hemorrhage, unspecified: Secondary | ICD-10-CM | POA: Diagnosis not present

## 2023-12-03 ENCOUNTER — Telehealth: Payer: Self-pay

## 2023-12-03 DIAGNOSIS — F32A Depression, unspecified: Secondary | ICD-10-CM | POA: Diagnosis not present

## 2023-12-03 DIAGNOSIS — E785 Hyperlipidemia, unspecified: Secondary | ICD-10-CM | POA: Diagnosis not present

## 2023-12-03 DIAGNOSIS — I110 Hypertensive heart disease with heart failure: Secondary | ICD-10-CM | POA: Diagnosis not present

## 2023-12-03 DIAGNOSIS — G40901 Epilepsy, unspecified, not intractable, with status epilepticus: Secondary | ICD-10-CM | POA: Diagnosis not present

## 2023-12-03 DIAGNOSIS — I619 Nontraumatic intracerebral hemorrhage, unspecified: Secondary | ICD-10-CM | POA: Diagnosis not present

## 2023-12-03 DIAGNOSIS — J449 Chronic obstructive pulmonary disease, unspecified: Secondary | ICD-10-CM | POA: Diagnosis not present

## 2023-12-03 DIAGNOSIS — M25551 Pain in right hip: Secondary | ICD-10-CM | POA: Diagnosis not present

## 2023-12-03 DIAGNOSIS — K5900 Constipation, unspecified: Secondary | ICD-10-CM | POA: Diagnosis not present

## 2023-12-03 DIAGNOSIS — I5021 Acute systolic (congestive) heart failure: Secondary | ICD-10-CM | POA: Diagnosis not present

## 2023-12-03 NOTE — Telephone Encounter (Signed)
 [name], Kyle [name], called stating that the pt is c/o leg pain at the ankle which has been keeping him up at HS.  Reviewed pt's chart, returned call for clarification, no answer, lf vm.

## 2023-12-04 ENCOUNTER — Encounter (HOSPITAL_COMMUNITY): Payer: Self-pay | Admitting: Internal Medicine

## 2023-12-04 ENCOUNTER — Emergency Department (HOSPITAL_COMMUNITY): Payer: Medicare HMO

## 2023-12-04 ENCOUNTER — Other Ambulatory Visit: Payer: Self-pay

## 2023-12-04 ENCOUNTER — Inpatient Hospital Stay (HOSPITAL_COMMUNITY)
Admission: EM | Admit: 2023-12-04 | Discharge: 2023-12-08 | DRG: 189 | Disposition: A | Discharge status: Skilled Nursing Facility | Payer: Medicare HMO | Source: Skilled Nursing Facility | Attending: Internal Medicine | Admitting: Internal Medicine

## 2023-12-04 DIAGNOSIS — I482 Chronic atrial fibrillation, unspecified: Secondary | ICD-10-CM | POA: Diagnosis present

## 2023-12-04 DIAGNOSIS — J449 Chronic obstructive pulmonary disease, unspecified: Secondary | ICD-10-CM | POA: Diagnosis present

## 2023-12-04 DIAGNOSIS — J441 Chronic obstructive pulmonary disease with (acute) exacerbation: Secondary | ICD-10-CM | POA: Diagnosis present

## 2023-12-04 DIAGNOSIS — N1831 Chronic kidney disease, stage 3a: Secondary | ICD-10-CM | POA: Diagnosis present

## 2023-12-04 DIAGNOSIS — J9811 Atelectasis: Secondary | ICD-10-CM | POA: Diagnosis not present

## 2023-12-04 DIAGNOSIS — E870 Hyperosmolality and hypernatremia: Secondary | ICD-10-CM | POA: Diagnosis present

## 2023-12-04 DIAGNOSIS — Z8616 Personal history of COVID-19: Secondary | ICD-10-CM | POA: Diagnosis not present

## 2023-12-04 DIAGNOSIS — M25551 Pain in right hip: Secondary | ICD-10-CM | POA: Diagnosis not present

## 2023-12-04 DIAGNOSIS — J9621 Acute and chronic respiratory failure with hypoxia: Principal | ICD-10-CM | POA: Diagnosis present

## 2023-12-04 DIAGNOSIS — I700 Atherosclerosis of aorta: Secondary | ICD-10-CM | POA: Diagnosis not present

## 2023-12-04 DIAGNOSIS — Z8349 Family history of other endocrine, nutritional and metabolic diseases: Secondary | ICD-10-CM

## 2023-12-04 DIAGNOSIS — G4700 Insomnia, unspecified: Secondary | ICD-10-CM | POA: Diagnosis present

## 2023-12-04 DIAGNOSIS — Z87891 Personal history of nicotine dependence: Secondary | ICD-10-CM

## 2023-12-04 DIAGNOSIS — J90 Pleural effusion, not elsewhere classified: Secondary | ICD-10-CM | POA: Diagnosis not present

## 2023-12-04 DIAGNOSIS — R0902 Hypoxemia: Principal | ICD-10-CM | POA: Diagnosis present

## 2023-12-04 DIAGNOSIS — U071 COVID-19: Secondary | ICD-10-CM | POA: Diagnosis not present

## 2023-12-04 DIAGNOSIS — K219 Gastro-esophageal reflux disease without esophagitis: Secondary | ICD-10-CM | POA: Diagnosis present

## 2023-12-04 DIAGNOSIS — J9601 Acute respiratory failure with hypoxia: Secondary | ICD-10-CM | POA: Diagnosis not present

## 2023-12-04 DIAGNOSIS — U099 Post covid-19 condition, unspecified: Secondary | ICD-10-CM | POA: Diagnosis present

## 2023-12-04 DIAGNOSIS — M6281 Muscle weakness (generalized): Secondary | ICD-10-CM | POA: Diagnosis not present

## 2023-12-04 DIAGNOSIS — L89510 Pressure ulcer of right ankle, unstageable: Secondary | ICD-10-CM | POA: Diagnosis present

## 2023-12-04 DIAGNOSIS — Z7901 Long term (current) use of anticoagulants: Secondary | ICD-10-CM | POA: Diagnosis not present

## 2023-12-04 DIAGNOSIS — L89312 Pressure ulcer of right buttock, stage 2: Secondary | ICD-10-CM | POA: Diagnosis not present

## 2023-12-04 DIAGNOSIS — R059 Cough, unspecified: Secondary | ICD-10-CM | POA: Diagnosis not present

## 2023-12-04 DIAGNOSIS — I69351 Hemiplegia and hemiparesis following cerebral infarction affecting right dominant side: Secondary | ICD-10-CM | POA: Diagnosis not present

## 2023-12-04 DIAGNOSIS — J439 Emphysema, unspecified: Secondary | ICD-10-CM | POA: Diagnosis present

## 2023-12-04 DIAGNOSIS — I352 Nonrheumatic aortic (valve) stenosis with insufficiency: Secondary | ICD-10-CM | POA: Diagnosis present

## 2023-12-04 DIAGNOSIS — R41841 Cognitive communication deficit: Secondary | ICD-10-CM | POA: Diagnosis not present

## 2023-12-04 DIAGNOSIS — J189 Pneumonia, unspecified organism: Secondary | ICD-10-CM | POA: Diagnosis not present

## 2023-12-04 DIAGNOSIS — Z794 Long term (current) use of insulin: Secondary | ICD-10-CM | POA: Diagnosis not present

## 2023-12-04 DIAGNOSIS — I739 Peripheral vascular disease, unspecified: Secondary | ICD-10-CM | POA: Diagnosis present

## 2023-12-04 DIAGNOSIS — E785 Hyperlipidemia, unspecified: Secondary | ICD-10-CM | POA: Diagnosis not present

## 2023-12-04 DIAGNOSIS — E1151 Type 2 diabetes mellitus with diabetic peripheral angiopathy without gangrene: Secondary | ICD-10-CM | POA: Diagnosis present

## 2023-12-04 DIAGNOSIS — I351 Nonrheumatic aortic (valve) insufficiency: Secondary | ICD-10-CM | POA: Diagnosis present

## 2023-12-04 DIAGNOSIS — I272 Pulmonary hypertension, unspecified: Secondary | ICD-10-CM | POA: Diagnosis present

## 2023-12-04 DIAGNOSIS — Z833 Family history of diabetes mellitus: Secondary | ICD-10-CM

## 2023-12-04 DIAGNOSIS — R4182 Altered mental status, unspecified: Secondary | ICD-10-CM | POA: Diagnosis not present

## 2023-12-04 DIAGNOSIS — F32A Depression, unspecified: Secondary | ICD-10-CM | POA: Diagnosis not present

## 2023-12-04 DIAGNOSIS — E1122 Type 2 diabetes mellitus with diabetic chronic kidney disease: Secondary | ICD-10-CM | POA: Diagnosis present

## 2023-12-04 DIAGNOSIS — R0602 Shortness of breath: Secondary | ICD-10-CM

## 2023-12-04 DIAGNOSIS — R262 Difficulty in walking, not elsewhere classified: Secondary | ICD-10-CM | POA: Diagnosis not present

## 2023-12-04 DIAGNOSIS — I69322 Dysarthria following cerebral infarction: Secondary | ICD-10-CM | POA: Diagnosis not present

## 2023-12-04 DIAGNOSIS — I420 Dilated cardiomyopathy: Secondary | ICD-10-CM | POA: Diagnosis present

## 2023-12-04 DIAGNOSIS — I2510 Atherosclerotic heart disease of native coronary artery without angina pectoris: Secondary | ICD-10-CM | POA: Diagnosis present

## 2023-12-04 DIAGNOSIS — I10 Essential (primary) hypertension: Secondary | ICD-10-CM | POA: Diagnosis present

## 2023-12-04 DIAGNOSIS — E1159 Type 2 diabetes mellitus with other circulatory complications: Secondary | ICD-10-CM | POA: Diagnosis present

## 2023-12-04 DIAGNOSIS — E1140 Type 2 diabetes mellitus with diabetic neuropathy, unspecified: Secondary | ICD-10-CM | POA: Diagnosis present

## 2023-12-04 DIAGNOSIS — I130 Hypertensive heart and chronic kidney disease with heart failure and stage 1 through stage 4 chronic kidney disease, or unspecified chronic kidney disease: Secondary | ICD-10-CM | POA: Diagnosis present

## 2023-12-04 DIAGNOSIS — I619 Nontraumatic intracerebral hemorrhage, unspecified: Secondary | ICD-10-CM | POA: Diagnosis not present

## 2023-12-04 DIAGNOSIS — J9611 Chronic respiratory failure with hypoxia: Secondary | ICD-10-CM | POA: Diagnosis not present

## 2023-12-04 DIAGNOSIS — J440 Chronic obstructive pulmonary disease with acute lower respiratory infection: Secondary | ICD-10-CM | POA: Diagnosis not present

## 2023-12-04 DIAGNOSIS — I5022 Chronic systolic (congestive) heart failure: Secondary | ICD-10-CM | POA: Diagnosis not present

## 2023-12-04 DIAGNOSIS — G40901 Epilepsy, unspecified, not intractable, with status epilepticus: Secondary | ICD-10-CM | POA: Diagnosis not present

## 2023-12-04 DIAGNOSIS — I110 Hypertensive heart disease with heart failure: Secondary | ICD-10-CM | POA: Diagnosis not present

## 2023-12-04 DIAGNOSIS — R051 Acute cough: Secondary | ICD-10-CM

## 2023-12-04 DIAGNOSIS — I5021 Acute systolic (congestive) heart failure: Secondary | ICD-10-CM | POA: Diagnosis not present

## 2023-12-04 DIAGNOSIS — E860 Dehydration: Secondary | ICD-10-CM | POA: Diagnosis present

## 2023-12-04 DIAGNOSIS — E119 Type 2 diabetes mellitus without complications: Principal | ICD-10-CM

## 2023-12-04 DIAGNOSIS — R531 Weakness: Secondary | ICD-10-CM | POA: Diagnosis not present

## 2023-12-04 DIAGNOSIS — Z7401 Bed confinement status: Secondary | ICD-10-CM | POA: Diagnosis not present

## 2023-12-04 DIAGNOSIS — R918 Other nonspecific abnormal finding of lung field: Secondary | ICD-10-CM | POA: Diagnosis not present

## 2023-12-04 DIAGNOSIS — L8990 Pressure ulcer of unspecified site, unspecified stage: Secondary | ICD-10-CM | POA: Diagnosis present

## 2023-12-04 DIAGNOSIS — Z79899 Other long term (current) drug therapy: Secondary | ICD-10-CM

## 2023-12-04 DIAGNOSIS — K5900 Constipation, unspecified: Secondary | ICD-10-CM | POA: Diagnosis not present

## 2023-12-04 DIAGNOSIS — Z9981 Dependence on supplemental oxygen: Secondary | ICD-10-CM

## 2023-12-04 DIAGNOSIS — Z8249 Family history of ischemic heart disease and other diseases of the circulatory system: Secondary | ICD-10-CM

## 2023-12-04 HISTORY — DX: Acute and chronic respiratory failure with hypoxia: J96.21

## 2023-12-04 LAB — CBC WITH DIFFERENTIAL/PLATELET
Abs Immature Granulocytes: 0.05 10*3/uL (ref 0.00–0.07)
Basophils Absolute: 0 10*3/uL (ref 0.0–0.1)
Basophils Relative: 0 %
Eosinophils Absolute: 0 10*3/uL (ref 0.0–0.5)
Eosinophils Relative: 0 %
HCT: 34.3 % — ABNORMAL LOW (ref 39.0–52.0)
Hemoglobin: 10.5 g/dL — ABNORMAL LOW (ref 13.0–17.0)
Immature Granulocytes: 1 %
Lymphocytes Relative: 11 %
Lymphs Abs: 1.1 10*3/uL (ref 0.7–4.0)
MCH: 28.9 pg (ref 26.0–34.0)
MCHC: 30.6 g/dL (ref 30.0–36.0)
MCV: 94.5 fL (ref 80.0–100.0)
Monocytes Absolute: 1.1 10*3/uL — ABNORMAL HIGH (ref 0.1–1.0)
Monocytes Relative: 11 %
Neutro Abs: 7.8 10*3/uL — ABNORMAL HIGH (ref 1.7–7.7)
Neutrophils Relative %: 77 %
Platelets: 295 10*3/uL (ref 150–400)
RBC: 3.63 MIL/uL — ABNORMAL LOW (ref 4.22–5.81)
RDW: 16.7 % — ABNORMAL HIGH (ref 11.5–15.5)
WBC: 10.1 10*3/uL (ref 4.0–10.5)
nRBC: 0 % (ref 0.0–0.2)

## 2023-12-04 LAB — TROPONIN I (HIGH SENSITIVITY)
Troponin I (High Sensitivity): 106 ng/L (ref <18)
Troponin I (High Sensitivity): 110 ng/L (ref <18)

## 2023-12-04 LAB — COMPREHENSIVE METABOLIC PANEL
ALT: 12 U/L (ref 0–44)
AST: 17 U/L (ref 15–41)
Albumin: 2.6 g/dL — ABNORMAL LOW (ref 3.5–5.0)
Alkaline Phosphatase: 46 U/L (ref 38–126)
Anion gap: 13 (ref 5–15)
BUN: 28 mg/dL — ABNORMAL HIGH (ref 8–23)
CO2: 21 mmol/L — ABNORMAL LOW (ref 22–32)
Calcium: 8.3 mg/dL — ABNORMAL LOW (ref 8.9–10.3)
Chloride: 113 mmol/L — ABNORMAL HIGH (ref 98–111)
Creatinine, Ser: 1.7 mg/dL — ABNORMAL HIGH (ref 0.61–1.24)
GFR, Estimated: 43 mL/min — ABNORMAL LOW (ref >60)
Glucose, Bld: 126 mg/dL — ABNORMAL HIGH (ref 70–99)
Potassium: 4.1 mmol/L (ref 3.5–5.1)
Sodium: 147 mmol/L — ABNORMAL HIGH (ref 135–145)
Total Bilirubin: 1.2 mg/dL (ref 0.0–1.2)
Total Protein: 6.4 g/dL — ABNORMAL LOW (ref 6.5–8.1)

## 2023-12-04 LAB — LACTIC ACID, PLASMA
Lactic Acid, Venous: 1 mmol/L (ref 0.5–1.9)
Lactic Acid, Venous: 0.8 mmol/L (ref 0.5–1.9)

## 2023-12-04 LAB — RESP PANEL BY RT-PCR (RSV, FLU A&B, COVID)  RVPGX2
Influenza A by PCR: NEGATIVE
Influenza B by PCR: NEGATIVE
Resp Syncytial Virus by PCR: NEGATIVE
SARS Coronavirus 2 by RT PCR: POSITIVE — AB

## 2023-12-04 LAB — GLUCOSE, CAPILLARY: Glucose-Capillary: 132 mg/dL — ABNORMAL HIGH (ref 70–99)

## 2023-12-04 LAB — BRAIN NATRIURETIC PEPTIDE: B Natriuretic Peptide: 344 pg/mL — ABNORMAL HIGH (ref 0.0–100.0)

## 2023-12-04 MED ORDER — METHYLPREDNISOLONE SODIUM SUCC 40 MG IJ SOLR
40.0000 mg | Freq: Two times a day (BID) | INTRAMUSCULAR | Status: DC
  Administered 2023-12-04 – 2023-12-06 (×4): 40 mg via INTRAVENOUS
  Filled 2023-12-04 (×4): qty 1

## 2023-12-04 MED ORDER — SODIUM CHLORIDE 0.9 % IV SOLN
500.0000 mg | INTRAVENOUS | Status: DC
  Administered 2023-12-04 – 2023-12-07 (×4): 500 mg via INTRAVENOUS
  Filled 2023-12-04 (×4): qty 5

## 2023-12-04 MED ORDER — ONDANSETRON HCL 4 MG PO TABS: 4.0000 mg | ORAL_TABLET | Freq: Four times a day (QID) | ORAL | Status: DC | PRN

## 2023-12-04 MED ORDER — INSULIN ASPART 100 UNIT/ML IJ SOLN
0.0000 [IU] | Freq: Every day | INTRAMUSCULAR | Status: DC
  Filled 2023-12-04: qty 0.05

## 2023-12-04 MED ORDER — ONDANSETRON HCL 4 MG/2ML IJ SOLN
4.0000 mg | Freq: Four times a day (QID) | INTRAMUSCULAR | Status: DC | PRN
  Filled 2023-12-04: qty 2

## 2023-12-04 MED ORDER — SODIUM CHLORIDE 0.9 % IV SOLN
2.0000 g | INTRAVENOUS | Status: DC
  Administered 2023-12-04 – 2023-12-07 (×4): 2 g via INTRAVENOUS
  Filled 2023-12-04 (×4): qty 20

## 2023-12-04 MED ORDER — INSULIN ASPART 100 UNIT/ML IJ SOLN
0.0000 [IU] | Freq: Three times a day (TID) | INTRAMUSCULAR | Status: DC
  Administered 2023-12-05: 3 [IU] via SUBCUTANEOUS
  Administered 2023-12-05 (×2): 5 [IU] via SUBCUTANEOUS
  Administered 2023-12-06 (×2): 3 [IU] via SUBCUTANEOUS
  Administered 2023-12-06: 5 [IU] via SUBCUTANEOUS
  Administered 2023-12-07: 3 [IU] via SUBCUTANEOUS
  Administered 2023-12-07: 5 [IU] via SUBCUTANEOUS
  Administered 2023-12-08: 3 [IU] via SUBCUTANEOUS
  Administered 2023-12-08: 2 [IU] via SUBCUTANEOUS
  Filled 2023-12-04: qty 0.15

## 2023-12-04 MED ORDER — IPRATROPIUM-ALBUTEROL 0.5-2.5 (3) MG/3ML IN SOLN
3.0000 mL | Freq: Four times a day (QID) | RESPIRATORY_TRACT | Status: DC
  Administered 2023-12-04 – 2023-12-05 (×2): 3 mL via RESPIRATORY_TRACT
  Filled 2023-12-04 (×2): qty 3

## 2023-12-04 NOTE — ED Provider Notes (Signed)
 [place] EMERGENCY DEPARTMENT AT [facility] Provider Note   CSN: [id number] Arrival date & time: 12/04/23  1434     History  No chief complaint on file.   Kyle [name] is a 73 y.o. male.  The history is provided by the patient, medical records and a relative. No language interpreter was used.  Shortness of Breath Severity:  Moderate Onset quality:  Gradual Duration:  2 days Timing:  Intermittent Progression:  Waxing and waning Chronicity:  Recurrent Context: URI   Relieved by:  Oxygen  Worsened by:  Coughing Ineffective treatments:  None tried Associated symptoms: cough   Associated symptoms: no abdominal pain, no chest pain, no diaphoresis, no fever, no headaches, no neck pain, no rash, no sputum production, no vomiting and no wheezing        Home Medications Prior to Admission medications   Medication Sig Start Date End Date Taking? Authorizing Provider  acetaminophen  (TYLENOL ) 325 MG tablet Take 2 tablets (650 mg total) by mouth every 4 (four) hours as needed for mild pain (pain score 1-3). Patient taking differently: Take 650 mg by mouth every 6 (six) hours as needed for mild pain (pain score 1-3). 09/27/23   Love, Sharlet RAMAN, PA-C  amLODipine  (NORVASC ) 10 MG tablet Take 1 tablet (10 mg total) by mouth daily. 09/11/23   Joshua Domino, DO  apixaban  (ELIQUIS ) 5 MG TABS tablet TAKE 1 TABLET TWICE DAILY 11/17/23   Jordan, Peter M, MD  bisacodyl  (DULCOLAX) 10 MG suppository Place 10 mg rectally daily as needed for moderate constipation.    [provider]  bisacodyl  (DULCOLAX) 5 MG EC tablet Take 1 tablet (5 mg total) by mouth daily. 10/06/23   Love, Sharlet RAMAN, PA-C  carvedilol  (COREG ) 6.25 MG tablet TAKE 1 TABLET TWICE DAILY WITH MEALS (NEED MD APPOINTMENT) 03/01/23   Jordan, Peter M, MD  cloNIDine  (CATAPRES ) 0.2 MG tablet Take 1 tablet (0.2 mg total) by mouth every 8 (eight) hours. 09/10/23   Joshua Domino, DO  diclofenac  Sodium (VOLTAREN ) 1 % GEL Apply 2 g  topically 4 (four) times daily. 10/05/23   LoveSharlet RAMAN, PA-C  Fluticasone -Umeclidin-Vilant (TRELEGY ELLIPTA ) 200-62.5-25 MCG/ACT AEPB INHALE 1 PUFF EVERY DAY 11/15/23   Jude Harden GAILS, MD  gabapentin  (NEURONTIN ) 600 MG tablet Take 600 mg by mouth at bedtime.    [provider]  guaifenesin  (ROBITUSSIN) 100 MG/5ML syrup Take 200 mg by mouth in the morning and at bedtime.    [provider]  hydrALAZINE  (APRESOLINE ) 100 MG tablet Take 1 tablet (100 mg total) by mouth 3 (three) times daily. 09/10/23   Joshua Domino, DO  insulin  aspart (NOVOLOG ) 100 UNIT/ML injection Inject 0-10 Units into the skin 3 (three) times daily before meals. Sliding Scale: If BG < 150, 0 units; BG 151-199, 2 units; BG 200-249, 4 units; BG 250-299, 6 units; BG 300-349, 8 units; 350-399, 10 units; > 400, notify MD    [provider]  insulin  degludec (TRESIBA ) 100 UNIT/ML FlexTouch Pen Inject 5 Units into the skin daily.    [provider]  isosorbide  mononitrate (IMDUR ) 30 MG 24 hr tablet TAKE 1 TABLET BY MOUTH ONCE DAILY. 07/26/23   Jordan, Peter M, MD  levETIRAcetam  (KEPPRA ) 500 MG tablet Take 1 tablet (500 mg total) by mouth 2 (two) times daily. 09/10/23   Joshua Domino, DO  magnesium  citrate SOLN Take 1 Bottle by mouth once.    [provider]  mirtazapine  (REMERON ) 7.5 MG tablet Take 1  tablet (7.5 mg total) by mouth at bedtime. 10/05/23   Love, Sharlet RAMAN, PA-C  Multiple Vitamins-Minerals (CENTRUM SILVER  50+MEN) TABS Take 1 tablet by mouth daily with breakfast.    [provider]  pantoprazole  (PROTONIX ) 40 MG tablet Take 1 tablet (40 mg total) by mouth daily. 10/06/23   Love, Sharlet RAMAN, PA-C  polyethylene glycol (MIRALAX  / GLYCOLAX ) 17 g packet Take 17 g by mouth daily.    [provider]  rosuvastatin  (CRESTOR ) 10 MG tablet TAKE 1 TABLET AT BEDTIME 08/30/23   Sheree Penne Bruckner, MD  senna-docusate (SENOKOT-S) 8.6-50 MG tablet Take 3 tablets by mouth 2 (two)  times daily. 10/05/23   Maurice Sharlet RAMAN, PA-C      Allergies    Patient has no known allergies.    Review of Systems   Review of Systems  Constitutional:  Positive for fatigue. Negative for chills, diaphoresis and fever.  HENT:  Negative for congestion.   Respiratory:  Positive for cough and shortness of breath. Negative for sputum production, chest tightness, wheezing and stridor.   Cardiovascular:  Negative for chest pain, palpitations and leg swelling.  Gastrointestinal:  Negative for abdominal pain, constipation, diarrhea, nausea and vomiting.  Genitourinary:  Negative for dysuria.  Musculoskeletal:  Negative for back pain, neck pain and neck stiffness.  Skin:  Negative for rash.  Neurological:  Negative for headaches.  Psychiatric/Behavioral:  Negative for agitation and confusion.   All other systems reviewed and are negative.   Physical Exam Updated Vital Signs BP (!) 93/53   Pulse 92   Temp 98.9 F (37.2 C)   Resp 20   SpO2 92%  Physical Exam Vitals and nursing note reviewed.  Constitutional:      General: He is not in acute distress.    Appearance: He is well-developed. He is not ill-appearing, toxic-appearing or diaphoretic.  HENT:     Head: Normocephalic and atraumatic.     Nose: No congestion or rhinorrhea.     Mouth/Throat:     Mouth: Mucous membranes are moist.     Pharynx: No oropharyngeal exudate or posterior oropharyngeal erythema.  Eyes:     Extraocular Movements: Extraocular movements intact.     Conjunctiva/sclera: Conjunctivae normal.     Pupils: Pupils are equal, round, and reactive to light.  Cardiovascular:     Rate and Rhythm: Normal rate and regular rhythm.     Heart sounds: No murmur heard. Pulmonary:     Effort: Pulmonary effort is normal. No respiratory distress.     Breath sounds: Rhonchi present. No wheezing or rales.  Chest:     Chest wall: No tenderness.  Abdominal:     General: Abdomen is flat.     Palpations: Abdomen is soft.      Tenderness: There is no abdominal tenderness. There is no right CVA tenderness, left CVA tenderness, guarding or rebound.  Musculoskeletal:        General: No swelling, tenderness or signs of injury.     Cervical back: Neck supple.     Right lower leg: No edema.     Left lower leg: No edema.  Skin:    General: Skin is warm and dry.     Capillary Refill: Capillary refill takes less than 2 seconds.     Findings: No erythema.  Neurological:     General: No focal deficit present.     Mental Status: He is alert.  Psychiatric:        Mood and Affect:  Mood normal.     ED Results / Procedures / Treatments   Labs (all labs ordered are listed, but only abnormal results are displayed) Labs Reviewed  RESP PANEL BY RT-PCR (RSV, FLU A&B, COVID)  RVPGX2 - Abnormal; Notable for the following components:      Result Value   SARS Coronavirus 2 by RT PCR POSITIVE (*)    All other components within normal limits  CBC WITH DIFFERENTIAL/PLATELET - Abnormal; Notable for the following components:   RBC 3.63 (*)    Hemoglobin 10.5 (*)    HCT 34.3 (*)    RDW 16.7 (*)    Neutro Abs 7.8 (*)    Monocytes Absolute 1.1 (*)    All other components within normal limits  COMPREHENSIVE METABOLIC PANEL - Abnormal; Notable for the following components:   Sodium 147 (*)    Chloride 113 (*)    CO2 21 (*)    Glucose, Bld 126 (*)    BUN 28 (*)    Creatinine, Ser 1.70 (*)    Calcium  8.3 (*)    Total Protein 6.4 (*)    Albumin  2.6 (*)    GFR, Estimated 43 (*)    All other components within normal limits  BRAIN NATRIURETIC PEPTIDE - Abnormal; Notable for the following components:   B Natriuretic Peptide 344.0 (*)    All other components within normal limits  TROPONIN I (HIGH SENSITIVITY) - Abnormal; Notable for the following components:   Troponin I (High Sensitivity) 106 (*)    All other components within normal limits  TROPONIN I (HIGH SENSITIVITY) - Abnormal; Notable for the following components:    Troponin I (High Sensitivity) 110 (*)    All other components within normal limits  CULTURE, BLOOD (ROUTINE X 2)  CULTURE, BLOOD (ROUTINE X 2)  LACTIC ACID, PLASMA  LACTIC ACID, PLASMA    EKG EKG Interpretation Date/Time:  Saturday December 04 2023 15:07:32 EST Ventricular Rate:  94 PR Interval:  148 QRS Duration:  99 QT Interval:  380 QTC Calculation: 476 R Axis:   25  Text Interpretation: Sinus rhythm Borderline T wave abnormalities Borderline prolonged QT interval when compared to prior, faster rate. no STEMI Confirmed by Ginger Barefoot ([id number]) on 12/04/2023 3:28:14 PM  Radiology DG Chest Portable 1 View Result Date: 12/04/2023 CLINICAL DATA:  Cough and shortness of breath. Hypoxia. Diagnosed with COVID-19 2 weeks ago EXAM: PORTABLE CHEST 1 VIEW COMPARISON:  11/22/2023 FINDINGS: The cardiac silhouette remains mildly enlarged. Tortuous aorta interval linear density at the left lung base. The remainder of the lungs are clear with normal vascularity no pleural fluid. Lower thoracic spine degenerative changes. IMPRESSION: 1. Interval linear atelectasis at the left lung base. 2. Mild cardiomegaly. Electronically Signed   By: Elspeth Bathe M.D.   On: 12/04/2023 15:20    Procedures Procedures    CRITICAL CARE Performed by: Lonni PARAS Addelynn Batte Total critical care time: 20 minutes Critical care time was exclusive of separately billable procedures and treating other patients. Critical care was necessary to treat or prevent imminent or life-threatening deterioration. Critical care was time spent personally by me on the following activities: development of treatment plan with patient and/or surrogate as well as nursing, discussions with consultants, evaluation of patient's response to treatment, examination of patient, obtaining history from patient or surrogate, ordering and performing treatments and interventions, ordering and review of laboratory studies, ordering and review of radiographic  studies, pulse oximetry and re-evaluation of patient's condition.  Medications Ordered in ED  Medications - No data to display  ED Course/ Medical Decision Making/ A&P                                 Medical Decision Making Amount and/or Complexity of Data Reviewed Labs: ordered. Radiology: ordered.  Risk Decision regarding hospitalization.    Kyle [name] is a 73 y.o. male with past medical history significant for atrial fibrillation on Eliquis  therapy, diabetes, CHF, pulmonary hypertension, dyslipidemia, CKD, previous stroke, and recent admission for COVID-19 inducing hypoxic respiratory failure 2 weeks ago who presents with recurrent cough, shortness of breath and hypoxia.  According to family, patient had a deeper cough yesterday and today at oxygen  saturations in the 70s at [facility].  He reportedly is normally on 4 L nasal cannula and today they could not get his oxygen  saturations up with the oxygen  they had available.  EMS reports that he needed to be placed on 10 L with a nonrebreather to get his oxygen  saturations into the 90s.  Family reports that he had done fairly well since discharge but the last few days was having more short of breath episodes and was having some soup.  Patient denies any fevers chills nausea vomiting.  Denies any leg pain or leg swelling and patient is on blood thinners.  Patient otherwise is denying complaints other than feeling tired.  He denies any pleuritic discomfort and is denying wheezing.  On exam, he has rhonchi in the bases but no wheezing for me.  Chest and abdomen are nontender.  Good pulses in extremities.  Legs are nontender and nonedematous.  Patient resting comfortably but is on the nonrebreather to keep his oxygen  saturations in the 90s.  Given his recent COVID infection, we will check for other viral illnesses such as influenza this in the community that  he may have caught as well.  We will get x-ray to look for post COVID-pneumonia and  get other labs.  Given his heart failure history we will check a BNP and a troponin.  Due to his increased oxygen  requirement from baseline, anticipate admission.  Will wait for workup results to determine disposition.  6:50 PM Workup continues to return.  Patient is still positive for coronavirus as expected but he Margarite does not have influenza as well.  BNP is higher than before.  Troponin is similarly elevated.  Metabolic panel similar to prior and he does not have significantly worsened anemia.  No leukocytosis.  Given the patient's oxygen  requirement now of liters nasal cannula to keep him in the low 90s, do not feel he is safe for discharge home.  Suspect some persistent inflammatory changes from his COVID infection.  Will call for medical admission for further respiratory management.         Final Clinical Impression(s) / ED Diagnoses Final diagnoses:  Hypoxia  Acute cough  Shortness of breath     Clinical Impression: 1. Hypoxia   2. Acute cough   3. Shortness of breath     Disposition: Admit  This note was prepared with assistance of Dragon voice recognition software. Occasional wrong-word or sound-a-like substitutions may have occurred due to the inherent limitations of voice recognition software.     Hermilo Dutter, Lonni PARAS, MD 12/04/23 [phone]

## 2023-12-04 NOTE — ED Notes (Signed)
 Only able to collect one blood culture bottle from the left arm

## 2023-12-04 NOTE — H&P (Signed)
 History and Physical    Patient: Kyle [name] FMW:[id number] DOB: 07/01/1951 DOA: 12/04/2023 DOS: the patient was seen and examined on 12/04/2023 PCP: Christia Budds, MD  Patient coming from: SNF  Chief Complaint: No chief complaint on file.  HPI: Kyle [name] is a 73 y.o. 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  Patient was admitted to the hospital where he was treated and subsequently discharged to [facility] nursing home.  He was brought back again today with significant shortness of breath and hypoxia.  Patient is currently on 9 L of oxygen  per minute but was brought in on oxygen  by none rebreather bag.  Patient suspected to have had ongoing hypoxic respiratory failure triggered by his COVID-19 infection.  Repeat testing showed no evidence of infiltrates or CHF however patient is hypoxic.  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  He has been admitted with acute on chronic hypoxic respiratory failure.  Review of Systems: As mentioned in the history of present illness. All other systems reviewed and are negative. Past Medical History:  Diagnosis Date   Aortic insufficiency    MODERATE WITH A BICUSPID AORTIC VAVLE   Arterial occlusive disease    MULTILEVEL   Atrial fibrillation (HCC)    CHF (congestive heart failure) (HCC)    Chronic bronchitis (HCC)    CKD (chronic kidney disease) stage 3, GFR 30-59 ml/min (HCC)    COPD (chronic obstructive pulmonary disease) (HCC)    Dilated cardiomyopathy (HCC)     WITH EJECTION FRACTION DOWN TO 20-25%--WITH CONGESTIVE HEART FAILURE   Edema    LOWER EXTREMETIES   Emphysema of lung (HCC)    Hypertension    Insulin  dependent type 2 diabetes mellitus (HCC)    Normal coronary arteries 2009   Orthopnea    Peripheral arterial disease (HCC)    Pulmonary hypertension (HCC)    Past Surgical History:  Procedure Laterality Date   ABDOMINAL AORTAGRAM N/A 11/05/2014   Procedure: ABDOMINAL EZELLA;  Surgeon: Lonni GORMAN Blade, MD;  Location: [facility] CATH LAB;  Service: Cardiovascular;  Laterality: N/A;   ABDOMINAL AORTOGRAM W/LOWER EXTREMITY Bilateral 04/21/2019   Procedure: ABDOMINAL AORTOGRAM W/LOWER EXTREMITY;  Surgeon: Blade Lonni GORMAN, MD;  Location: [facility] INVASIVE CV LAB;  Service: Cardiovascular;  Laterality: Bilateral;   ABDOMINAL AORTOGRAM W/LOWER EXTREMITY Right 05/30/2021   Procedure: ABDOMINAL AORTOGRAM W/LOWER EXTREMITY;  Surgeon: Blade Lonni GORMAN, MD;  Location: [facility] INVASIVE CV LAB;  Service: Cardiovascular;  Laterality: Right;   AORTA - BILATERAL FEMORAL ARTERY BYPASS GRAFT N/A 12/18/2014   Procedure: AORTOBIFEMORAL BYPASS GRAFT;  Surgeon: Lonni GORMAN Blade, MD;  Location: [facility] OR;  Service: Vascular;  Laterality: N/A;   CARDIAC CATHETERIZATION  2009   Nl Cors, EF 20%   COLONOSCOPY     ENDARTERECTOMY FEMORAL Left 12/18/2014   Procedure: ENDARTERECTOMY FEMORAL;  Surgeon: Lonni GORMAN Blade, MD;  Location: [facility] OR;  Service: Vascular;  Laterality: Left;   FALSE ANEURYSM REPAIR Right 04/24/2019   Procedure: REPAIR OF RIGHT FEMORAL ARTERY PSEUDOANEURYSM;  Surgeon: Oris Krystal FALCON, MD;  Location: [facility] OR;  Service: Vascular;  Laterality: Right;   FEMORAL-POPLITEAL BYPASS  GRAFT  02/21/2011   right   FEMORAL-TIBIAL BYPASS GRAFT Right 04/24/2019   Procedure: REDO BYPASS GRAFT RIGHT FEMORAL-TIBIAL ARTERY USING LEFT LEG VEIN & HEMASHIELD GOLD 8mm GRAFT;  Surgeon: Oris Krystal FALCON, MD;  Location: [facility] OR;  Service: Vascular;  Laterality: Right;   IR FLUORO  GUIDE CV LINE RIGHT  12/28/2019   IR REMOVAL TUN CV CATH W/O FL  01/11/2020   IR US  GUIDE VASC ACCESS RIGHT  12/28/2019   OTHER SURGICAL HISTORY     POST RIGHT FEMOROPOPLITEAL BYPASS GRAFT   PERIPHERAL VASCULAR CATHETERIZATION  11/05/2014   Procedure: LOWER EXTREMITY ANGIOGRAPHY;  Surgeon: Lonni GORMAN Blade, MD;  Location: [facility] CATH LAB;  Service: Cardiovascular;;   VEIN HARVEST Left 04/24/2019   Procedure: LEFT LEG SAPHENOUS VEIN HARVEST;  Surgeon: Oris Krystal FALCON, MD;  Location: [facility] OR;  Service: Vascular;  Laterality: Left;   Social History:  reports that he quit smoking about 5 years ago. His smoking use included cigarettes. He started smoking about 45 years ago. He has a 40 pack-year smoking history. He has never been exposed to tobacco smoke. He has never used smokeless tobacco. He reports that he does not drink alcohol and does not use drugs.  No Known Allergies  Family History  Problem Relation Age of Onset   Diabetes Mother    Hyperlipidemia Sister    Hypertension Sister     Prior to Admission medications   Medication Sig Start Date End Date Taking? Authorizing Provider  acetaminophen  (TYLENOL ) 325 MG tablet Take 2 tablets (650 mg total) by mouth every 4 (four) hours as needed for mild pain (pain score 1-3). Patient taking differently: Take 650 mg by mouth every 6 (six) hours as needed for mild pain (pain score 1-3). 09/27/23   Love, Sharlet GORMAN, PA-C  amLODipine  (NORVASC ) 10 MG tablet Take 1 tablet (10 mg total) by mouth daily. 09/11/23   Joshua Domino, DO  apixaban  (ELIQUIS ) 5 MG TABS tablet TAKE 1 TABLET TWICE DAILY 11/17/23   Jordan, Peter M, MD  bisacodyl  (DULCOLAX) 10 MG suppository Place 10 mg rectally daily as needed for moderate constipation.    [provider]  bisacodyl  (DULCOLAX) 5 MG EC tablet Take 1 tablet (5 mg total) by mouth daily. 10/06/23   Love, Sharlet GORMAN, PA-C  carvedilol  (COREG ) 6.25 MG tablet TAKE 1 TABLET TWICE DAILY WITH MEALS (NEED MD APPOINTMENT) 03/01/23    Jordan, Peter M, MD  cloNIDine  (CATAPRES ) 0.2 MG tablet Take 1 tablet (0.2 mg total) by mouth every 8 (eight) hours. 09/10/23   Joshua Domino, DO  diclofenac  Sodium (VOLTAREN ) 1 % GEL Apply 2 g topically 4 (four) times daily. 10/05/23   Love, Sharlet GORMAN, PA-C  Fluticasone -Umeclidin-Vilant (TRELEGY ELLIPTA ) 200-62.5-25 MCG/ACT AEPB INHALE 1 PUFF EVERY DAY 11/15/23   Jude Harden GAILS, MD  gabapentin  (NEURONTIN ) 600 MG tablet Take 600 mg by mouth at bedtime.    [provider]  guaifenesin  (ROBITUSSIN) 100 MG/5ML syrup Take 200 mg by mouth in the morning and at bedtime.    [provider]  hydrALAZINE  (APRESOLINE ) 100 MG tablet Take 1 tablet (100 mg total) by mouth 3 (three) times daily. 09/10/23   Joshua Domino, DO  insulin  aspart (NOVOLOG ) 100 UNIT/ML injection Inject 0-10 Units into the skin 3 (three) times daily before meals. Sliding Scale: If BG < 150, 0 units; BG 151-199, 2 units; BG 200-249, 4 units; BG 250-299, 6 units; BG 300-349, 8 units; 350-399, 10 units; > 400, notify MD  [provider]  insulin  degludec (TRESIBA ) 100 UNIT/ML FlexTouch Pen Inject 5 Units into the skin daily.    [provider]  isosorbide  mononitrate (IMDUR ) 30 MG 24 hr tablet TAKE 1 TABLET BY MOUTH ONCE DAILY. 07/26/23   Jordan, Peter M, MD  levETIRAcetam  (KEPPRA ) 500 MG tablet Take 1 tablet (500 mg total) by mouth 2 (two) times daily. 09/10/23   Joshua Domino, DO  magnesium  citrate SOLN Take 1 Bottle by mouth once.    [provider]  mirtazapine  (REMERON ) 7.5 MG tablet Take 1 tablet (7.5 mg total) by mouth at bedtime. 10/05/23   Love, Sharlet RAMAN, PA-C  Multiple Vitamins-Minerals (CENTRUM SILVER  50+MEN) TABS Take 1 tablet by mouth daily with breakfast.    [provider]  pantoprazole  (PROTONIX ) 40 MG tablet Take 1 tablet (40 mg total) by mouth daily. 10/06/23   Love, Sharlet RAMAN, PA-C  polyethylene glycol (MIRALAX  / GLYCOLAX ) 17 g packet Take 17 g by mouth daily.    [provider]  rosuvastatin  (CRESTOR ) 10 MG tablet TAKE 1 TABLET AT BEDTIME 08/30/23   Sheree Penne Bruckner, MD  senna-docusate (SENOKOT-S) 8.6-50 MG tablet Take 3 tablets by mouth 2 (two) times daily. 10/05/23   Maurice Sharlet RAMAN, PA-C    Physical Exam: Vitals:   12/04/23 1630 12/04/23 1700 12/04/23 1730 12/04/23 1800  BP: 134/71 108/73 104/82 (!) 93/53  Pulse: (!) 104 88 90 92  Resp: (!) 26 16 14 20   Temp:      SpO2: 95% 91% (!) 89% 92%   Constitutional: Acute on chronically ill looking in respiratory distress, NAD, calm, comfortable Eyes: PERRL, lids and conjunctivae normal ENMT: Mucous membranes are moist. Posterior pharynx clear of any exudate or lesions.Normal dentition.  Neck: normal, supple, no masses, no thyromegaly Respiratory: Decreased air entry bilaterally with mild expiratory wheezing, currently using accessory muscle respiration.  Cardiovascular: Sinus tachycardia, no murmurs / rubs / gallops. No extremity edema. 2+ pedal pulses. No carotid bruits.  Abdomen: no tenderness, no masses palpated. No hepatosplenomegaly. Bowel sounds positive.  Musculoskeletal: Good range of motion, no joint swelling or tenderness, Skin: no rashes, lesions, ulcers. No induration Neurologic: CN 2-12 grossly intact. Sensation intact, DTR normal. Strength 5/5 in all 4.  Psychiatric: Normal judgment and insight. Alert and oriented x 3. Normal mood  Data Reviewed:  Sodium 147, chloride 113, glucose 126 BUN 20 creatinine 1.70, BNP of 344, troponin 106 then 110 with a delta of 4, hemoglobin 10.5.  COVID-19 screen positive negative for influenza.  Chest x-ray showed interval atelectasis in the left lung base with mild cardiomegaly.  Assessment and Plan:  #1 acute on chronic hypoxic respiratory failure: Most likely secondary to COPD exacerbation and positive COVID pneumonia.  Patient will be admitted.  Initiate IV Rocephin  and Zithromax  for possible pneumonia.  IV steroids for COPD exacerbation.   Currently on 9 L of nasal cannula already improving.  Continue to titrate oxygen  to his baseline which is between 4 and 5 L of oxygen  per minute.  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  Patient outside the window for isolation.  #3 hyponatremia: Appears to be mildly dehydrated from his COVID-19.  He does have history of heart failure so we will be careful with any fluid resuscitation.  #4 pulmonary hypertension: Patient will be maintained on oxygen  hopefully titrated to his baseline.  #5 COPD with acute exacerbation: As per #1 above.  #6 coronary artery disease: Troponin elevated.  Most likely chronic elevation from CHF history.  Continue to follow and trend.  #7 heart failure with preserved ejection fraction: Continue monitoring fluid status.  Confirm on resume home regimen.  Already dehydrated so we will be careful with diuresis.  #8 chronic kidney disease stage IIIb: Avoid nephrotoxic medications.  Monitor renal function  #9 type 2 diabetes: Will continue with sliding scale insulin .  #10 chronic atrial fibrillation: Continue chronic anticoagulation.  Patient has history of previous intracerebral bleed.  Will be careful.  #11 essential hypertension: Blood pressure appears controlled.  Will confirm on resume home regimen  #12 peripheral vascular disease: Continue chronic home regimen    Advance Care Planning:   Code Status: Full Code   Consults: None  Family Communication: No family at bedside  Severity of Illness: The appropriate patient status for this patient is INPATIENT. Inpatient status is judged to be reasonable and necessary in order to provide the required intensity of service to ensure the patient's safety. The patient's presenting symptoms, physical exam findings, and initial radiographic and laboratory data in the context of their chronic comorbidities is felt to place them at high risk for further clinical deterioration. Furthermore, it is not anticipated  that the patient will be medically stable for discharge from the hospital within 2 midnights of admission.   * I certify that at the point of admission it is my clinical judgment that the patient will require inpatient hospital care spanning beyond 2 midnights from the point of admission due to high intensity of service, high risk for further deterioration and high frequency of surveillance required.*  AuthorBETHA SIM KNOLL, MD 12/04/2023 7:18 PM  For on call review [email or web address].

## 2023-12-04 NOTE — Progress Notes (Signed)
 Patient is confused and unable to finish the admission questions at this time.

## 2023-12-04 NOTE — ED Triage Notes (Signed)
 BIBA from [facility] for hypoxia. Pt is on  4 lpm at all times, O2 sats dropping per facility. 98% on 10 lpm rhonchi in lower lobes 167 cbg 122/67 BP 98 HR

## 2023-12-05 ENCOUNTER — Inpatient Hospital Stay (HOSPITAL_COMMUNITY): Payer: Medicare HMO

## 2023-12-05 DIAGNOSIS — R0902 Hypoxemia: Secondary | ICD-10-CM

## 2023-12-05 LAB — MRSA NEXT GEN BY PCR, NASAL: MRSA by PCR Next Gen: NOT DETECTED

## 2023-12-05 LAB — BASIC METABOLIC PANEL
Anion gap: 13 (ref 5–15)
BUN: 36 mg/dL — ABNORMAL HIGH (ref 8–23)
CO2: 23 mmol/L (ref 22–32)
Calcium: 8.6 mg/dL — ABNORMAL LOW (ref 8.9–10.3)
Chloride: 109 mmol/L (ref 98–111)
Creatinine, Ser: 1.79 mg/dL — ABNORMAL HIGH (ref 0.61–1.24)
GFR, Estimated: 40 mL/min — ABNORMAL LOW (ref >60)
Glucose, Bld: 258 mg/dL — ABNORMAL HIGH (ref 70–99)
Potassium: 4.1 mmol/L (ref 3.5–5.1)
Sodium: 145 mmol/L (ref 135–145)

## 2023-12-05 LAB — GLUCOSE, CAPILLARY
Glucose-Capillary: 153 mg/dL — ABNORMAL HIGH (ref 70–99)
Glucose-Capillary: 247 mg/dL — ABNORMAL HIGH (ref 70–99)
Glucose-Capillary: 233 mg/dL — ABNORMAL HIGH (ref 70–99)
Glucose-Capillary: 162 mg/dL — ABNORMAL HIGH (ref 70–99)

## 2023-12-05 LAB — CBC
HCT: 34.1 % — ABNORMAL LOW (ref 39.0–52.0)
Hemoglobin: 10.3 g/dL — ABNORMAL LOW (ref 13.0–17.0)
MCH: 28.8 pg (ref 26.0–34.0)
MCHC: 30.2 g/dL (ref 30.0–36.0)
MCV: 95.3 fL (ref 80.0–100.0)
Platelets: 288 10*3/uL (ref 150–400)
RBC: 3.58 MIL/uL — ABNORMAL LOW (ref 4.22–5.81)
RDW: 16.7 % — ABNORMAL HIGH (ref 11.5–15.5)
WBC: 9.4 10*3/uL (ref 4.0–10.5)
nRBC: 0 % (ref 0.0–0.2)

## 2023-12-05 LAB — COMPREHENSIVE METABOLIC PANEL
ALT: 12 U/L (ref 0–44)
AST: 14 U/L — ABNORMAL LOW (ref 15–41)
Albumin: 2.5 g/dL — ABNORMAL LOW (ref 3.5–5.0)
Alkaline Phosphatase: 50 U/L (ref 38–126)
Anion gap: 14 (ref 5–15)
BUN: 31 mg/dL — ABNORMAL HIGH (ref 8–23)
CO2: 21 mmol/L — ABNORMAL LOW (ref 22–32)
Calcium: 8.4 mg/dL — ABNORMAL LOW (ref 8.9–10.3)
Chloride: 113 mmol/L — ABNORMAL HIGH (ref 98–111)
Creatinine, Ser: 1.69 mg/dL — ABNORMAL HIGH (ref 0.61–1.24)
GFR, Estimated: 43 mL/min — ABNORMAL LOW (ref >60)
Glucose, Bld: 172 mg/dL — ABNORMAL HIGH (ref 70–99)
Potassium: 4.3 mmol/L (ref 3.5–5.1)
Sodium: 148 mmol/L — ABNORMAL HIGH (ref 135–145)
Total Bilirubin: 0.9 mg/dL (ref 0.0–1.2)
Total Protein: 6.9 g/dL (ref 6.5–8.1)

## 2023-12-05 MED ORDER — UMECLIDINIUM BROMIDE 62.5 MCG/ACT IN AEPB
1.0000 | INHALATION_SPRAY | Freq: Every day | RESPIRATORY_TRACT | Status: DC
  Administered 2023-12-07 – 2023-12-08 (×2): 1 via RESPIRATORY_TRACT
  Filled 2023-12-05 (×2): qty 7

## 2023-12-05 MED ORDER — ROSUVASTATIN CALCIUM 10 MG PO TABS
10.0000 mg | ORAL_TABLET | Freq: Every day | ORAL | Status: DC
  Administered 2023-12-05 – 2023-12-07 (×3): 10 mg via ORAL
  Filled 2023-12-05 (×3): qty 1

## 2023-12-05 MED ORDER — CLONIDINE HCL 0.2 MG PO TABS
0.2000 mg | ORAL_TABLET | ORAL | Status: DC
  Administered 2023-12-05 – 2023-12-08 (×9): 0.2 mg via ORAL
  Filled 2023-12-05 (×9): qty 1

## 2023-12-05 MED ORDER — ALBUTEROL SULFATE (2.5 MG/3ML) 0.083% IN NEBU: 2.5000 mg | INHALATION_SOLUTION | RESPIRATORY_TRACT | Status: DC | PRN

## 2023-12-05 MED ORDER — APIXABAN 5 MG PO TABS
5.0000 mg | ORAL_TABLET | Freq: Two times a day (BID) | ORAL | Status: DC
  Administered 2023-12-05 – 2023-12-08 (×6): 5 mg via ORAL
  Filled 2023-12-05 (×6): qty 1

## 2023-12-05 MED ORDER — FLUTICASONE FUROATE-VILANTEROL 200-25 MCG/ACT IN AEPB
1.0000 | INHALATION_SPRAY | Freq: Every day | RESPIRATORY_TRACT | Status: DC
  Administered 2023-12-06 – 2023-12-08 (×3): 1 via RESPIRATORY_TRACT
  Filled 2023-12-05: qty 28

## 2023-12-05 MED ORDER — MIRTAZAPINE 15 MG PO TABS
7.5000 mg | ORAL_TABLET | Freq: Every day | ORAL | Status: DC
  Administered 2023-12-05 – 2023-12-07 (×3): 7.5 mg via ORAL
  Filled 2023-12-05 (×3): qty 1

## 2023-12-05 MED ORDER — IPRATROPIUM-ALBUTEROL 0.5-2.5 (3) MG/3ML IN SOLN
3.0000 mL | Freq: Two times a day (BID) | RESPIRATORY_TRACT | Status: DC
  Administered 2023-12-05 – 2023-12-08 (×6): 3 mL via RESPIRATORY_TRACT
  Filled 2023-12-05 (×6): qty 3

## 2023-12-05 MED ORDER — PANTOPRAZOLE SODIUM 40 MG PO TBEC
40.0000 mg | DELAYED_RELEASE_TABLET | Freq: Every day | ORAL | Status: DC
  Administered 2023-12-05 – 2023-12-08 (×4): 40 mg via ORAL
  Filled 2023-12-05 (×4): qty 1

## 2023-12-05 MED ORDER — GABAPENTIN 300 MG PO CAPS
600.0000 mg | ORAL_CAPSULE | Freq: Every day | ORAL | Status: DC
  Administered 2023-12-05 – 2023-12-07 (×3): 600 mg via ORAL
  Filled 2023-12-05 (×3): qty 2

## 2023-12-05 MED ORDER — DEXTROSE 5 % IV SOLN: INTRAVENOUS | Status: AC

## 2023-12-05 MED ORDER — CARVEDILOL 6.25 MG PO TABS
6.2500 mg | ORAL_TABLET | Freq: Two times a day (BID) | ORAL | Status: DC
  Administered 2023-12-06 – 2023-12-08 (×5): 6.25 mg via ORAL
  Filled 2023-12-05 (×5): qty 1

## 2023-12-05 MED ORDER — LEVETIRACETAM 500 MG PO TABS
500.0000 mg | ORAL_TABLET | Freq: Two times a day (BID) | ORAL | Status: DC
  Administered 2023-12-05 – 2023-12-08 (×7): 500 mg via ORAL
  Filled 2023-12-05 (×7): qty 1

## 2023-12-05 NOTE — Progress Notes (Signed)
 PROGRESS NOTE    Kyle [name]  FMW:[id number] DOB: [birth year] DOA: 12/04/2023 PCP: Kyle Budds, MD  No chief complaint on file.   Brief Narrative:   Kyle [name] is Kyle [name] 73 y.o. 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  Patient was admitted to the hospital where he was treated and subsequently discharged to [facility] nursing home.  He was brought back again today with significant shortness of breath and hypoxia.   Currently being treated for possible CAP vs COPD exacerbation.  Assessment & Plan:   Active Problems:   Aortic insufficiency   Hypertension   Peripheral arterial disease (HCC)   Chronic systolic CHF (congestive heart failure) (HCC)   Pulmonary hypertension (HCC)   COPD (chronic obstructive pulmonary disease) (HCC)   Atrial fibrillation, chronic (HCC)   Diabetes mellitus type 2, insulin  dependent (HCC)   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HCC)   Hypernatremia   Acute on chronic respiratory failure with hypoxemia (HCC)  Acute on Chronic Hypoxic Respiratory Failure  COPD with exacerbation  Presumed CAP  Discharged on 4.5 L from last hospitalization (brought into ED on NRB, on 9 L [place] at time of H&P) CXR with linear atelectasis at L lung base Will get CT chest  Continue abx to cover for CAP Continue steroids for now Notably recently treated for covid, diagnosed 1/27 - will follow CT results prior to discontinuing isolation (consider following cycle time)  Recent COVID 19 infection Follow CT, likely can come  off isolation  Hypernatremia Follow on hypotonic IVF  HFpEF Severe Aortic Valve Stenosis  Echo from 08/2023 with EF 55-60%, no RWMA, grade 1 diastolic dysfunction, normal PASP Appears euvolemic   T2DM SSI  Atrial fibrillation Eliquis   Hx Hemorrhagic CVA Residual dysarthria and R sided weakness Per 11/26 neuro note, had restarted anticoagulation Keppra   Hypertension Amlodipine  was held on discharge Will hold hydralazine  for now Resume clonidine  and coreg   Neuropathic Pain Gabapentin   Insomnia Remeron   Dyslipidemia Crestor   GERD PPI     DVT prophylaxis: eliquis  Code Status: full Family Communication: none Disposition:   Status is: Inpatient Remains inpatient appropriate because: need for continued inpatient care   Consultants:  none  Procedures:  none  Antimicrobials:  Anti-infectives (From admission, onward)    Start     Dose/Rate Route Frequency Ordered Stop   12/04/23 2030  cefTRIAXone  (ROCEPHIN ) 2 g in sodium chloride  0.9 % 100 mL IVPB        2 g 200 mL/hr over 30 Minutes Intravenous Every 24 hours 12/04/23 1939     12/04/23 2030  azithromycin  (ZITHROMAX ) 500 mg in sodium chloride  0.9 % 250 mL IVPB        500 mg 250 mL/hr over 60 Minutes Intravenous Every 24 hours 12/04/23 1939         Subjective: Denies any complaints  Objective: Vitals:   12/04/23 2250 12/05/23 0302 12/05/23 0504 12/05/23 0753  BP:  (!) 158/97 (!) 157/71   Pulse:  78 70   Resp:  18 18   Temp:  98.2 F (36.8 C) 98.9 F (37.2 C)   TempSrc:  Oral  SpO2: 95% 93% 92% 98%    Intake/Output Summary (Last 24 hours) at 12/05/2023 1732 Last data filed at 12/05/2023 1330 Gross per 24 hour  Intake 710 ml  Output --  Net 710 ml   There were no vitals filed for this visit.  Examination:  General exam: Appears calm and comfortable  Respiratory system: diminished anterior exam Cardiovascular system: RRR Gastrointestinal system: Abdomen is nondistended, soft and  nontender. Central nervous system: Alert. No focal neurological deficits. Extremities: no LEE    Data Reviewed: I have personally reviewed following labs and imaging studies  CBC: Recent Labs  Lab 12/04/23 1510 12/05/23 0442  WBC 10.1 9.4  NEUTROABS 7.8*  --   HGB 10.5* 10.3*  HCT 34.3* 34.1*  MCV 94.5 95.3  PLT 295 288    Basic Metabolic Panel: Recent Labs  Lab 12/04/23 1510 12/05/23 0442 12/05/23 1553  NA 147* 148* 145  K 4.1 4.3 4.1  CL 113* 113* 109  CO2 21* 21* 23  GLUCOSE 126* 172* 258*  BUN 28* 31* 36*  CREATININE 1.70* 1.69* 1.79*  CALCIUM  8.3* 8.4* 8.6*    GFR: Estimated Creatinine Clearance: 33.7 mL/min (Kyle [name]) (by C-G formula based on SCr of 1.79 mg/dL (H)).  Liver Function Tests: Recent Labs  Lab 12/04/23 1510 12/05/23 0442  AST 17 14*  ALT 12 12  ALKPHOS 46 50  BILITOT 1.2 0.9  PROT 6.4* 6.9  ALBUMIN  2.6* 2.5*    CBG: Recent Labs  Lab 12/04/23 2120 12/05/23 0856 12/05/23 1128 12/05/23 1611  GLUCAP 132* 153* 247* 233*     Recent Results (from the past 240 hours)  Resp panel by RT-PCR (RSV, Flu Kyle [name]&B, Covid) Anterior Nasal Swab     Status: Abnormal   Collection Time: 12/04/23  3:10 PM   Specimen: Anterior Nasal Swab  Result Value Ref Range Status   SARS Coronavirus 2 by RT PCR POSITIVE (Kyle [name]) NEGATIVE Final    Comment: (NOTE) SARS-CoV-2 target nucleic acids are DETECTED.  The SARS-CoV-2 RNA is generally detectable in upper respiratory specimens during the acute phase of infection. Positive results are indicative of the presence of the identified virus, but do not rule out bacterial infection or co-infection with other pathogens not detected by the test. Clinical correlation with patient history and other diagnostic information is necessary to determine patient infection status. The expected result is Negative.  Fact Sheet for Patients: bloggercourse.com  Fact Sheet for Healthcare  Providers: seriousbroker.it  This test is not yet approved or cleared by the [place]  FDA and  has been authorized for detection and/or diagnosis of SARS-CoV-2 by FDA under an Emergency Use Authorization (EUA).  This EUA will remain in effect (meaning this test can be used) for the duration of  the COVID-19 declaration under Section 564(b)(1) of the [place] [name] ct, 21 U.S.C. section 360bbb-3(b)(1), unless the authorization is terminated or revoked sooner.     Influenza Kyle [name] by PCR NEGATIVE NEGATIVE Final   Influenza B by PCR NEGATIVE NEGATIVE Final    Comment: (NOTE) The Xpert Xpress SARS-CoV-2/FLU/RSV plus assay is intended as an aid in the diagnosis of influenza from Nasopharyngeal swab specimens and should not be used as Kyle [name] sole basis for treatment. Nasal washings and aspirates are unacceptable for Xpert Xpress SARS-CoV-2/FLU/RSV testing.  Fact Sheet for Patients: bloggercourse.com  Fact Sheet for Healthcare Providers: seriousbroker.it  This test is not yet approved or cleared by the [place]  FDA and has been authorized for detection and/or diagnosis of SARS-CoV-2 by FDA  under an Emergency Use Authorization (EUA). This EUA will remain in effect (meaning this test can be used) for the duration of the COVID-19 declaration under Section 564(b)(1) of the Act, 21 U.S.C. section 360bbb-3(b)(1), unless the authorization is terminated or revoked.     Resp Syncytial Virus by PCR NEGATIVE NEGATIVE Final    Comment: (NOTE) Fact Sheet for Patients: bloggercourse.com  Fact Sheet for Healthcare Providers: seriousbroker.it  This test is not yet approved or cleared by the [place]  FDA and has been authorized for detection and/or diagnosis of SARS-CoV-2 by FDA under an Emergency Use Authorization (EUA). This EUA will remain in effect (meaning this test can be  used) for the duration of the COVID-19 declaration under Section 564(b)(1) of the Act, 21 U.S.C. section 360bbb-3(b)(1), unless the authorization is terminated or revoked.  Performed at [facility], [address], [place], [name] [address]   Blood culture (routine x 2)     Status: None (Preliminary result)   Collection Time: 12/04/23  3:19 PM   Specimen: BLOOD  Result Value Ref Range Status   Specimen Description   Final    BLOOD BLOOD LEFT ARM Performed at [facility], [address], [place], [name] [address]    Special Requests   Final    BOTTLES DRAWN AEROBIC ONLY Blood Culture results may not be optimal due to an inadequate volume of blood received in culture bottles Performed at [facility], [address], [place], [name] [address]    Culture   Final    NO GROWTH < 24 HOURS Performed at [facility], [address], [place], [name] [address]    Report Status PENDING  Incomplete  Blood culture (routine x 2)     Status: None (Preliminary result)   Collection Time: 12/04/23 10:06 PM   Specimen: BLOOD  Result Value Ref Range Status   Specimen Description   Final    BLOOD BLOOD LEFT HAND AEROBIC BOTTLE ONLY Performed at [facility], [address], [place], [name] [address]    Special Requests   Final    Blood Culture results may not be optimal due to an inadequate volume of blood received in culture bottles BOTTLES DRAWN AEROBIC ONLY Performed at [facility], [address] [place], [name] [address]    Culture   Final    NO GROWTH < 12 HOURS Performed at [facility], [address] [place], [name] [address]    Report Status PENDING  Incomplete  MRSA Next Gen by PCR, Nasal     Status: None   Collection Time: 12/05/23 12:35 AM   Specimen: Nasal Mucosa; Nasal Swab  Result Value Ref Range Status   MRSA by PCR Next Gen NOT DETECTED NOT DETECTED Final     Comment: (NOTE) The GeneXpert MRSA Assay (FDA approved for NASAL specimens only), is one component of Kyle [name] comprehensive MRSA colonization surveillance program. It is not intended to diagnose MRSA infection nor to guide or monitor treatment for MRSA infections. Test performance is not FDA approved in patients less than 66 years old. Performed at [facility], [address] [place], [name] [address]          Radiology Studies: DG Chest Portable 1 View Result Date: 12/04/2023 CLINICAL DATA:  Cough and shortness of breath. Hypoxia. Diagnosed with COVID-19 2 weeks ago EXAM: PORTABLE CHEST 1 VIEW COMPARISON:  11/22/2023 FINDINGS: The cardiac silhouette remains  mildly enlarged. Tortuous aorta interval linear density at the left lung base. The remainder of the lungs are clear with normal vascularity no pleural fluid. Lower thoracic spine degenerative changes. IMPRESSION: 1. Interval linear atelectasis at the left lung base. 2. Mild cardiomegaly. Electronically Signed   By: Kyle [name] M.D.   On: 12/04/2023 15:20        Scheduled Meds:  apixaban   5 mg Oral BID   insulin  aspart  0-15 Units Subcutaneous TID WC   insulin  aspart  0-5 Units Subcutaneous QHS   ipratropium-albuterol   3 mL Nebulization BID   levETIRAcetam   500 mg Oral BID   methylPREDNISolone  (SOLU-MEDROL ) injection  40 mg Intravenous Q12H   Continuous Infusions:  azithromycin  500 mg (12/04/23 2209)   cefTRIAXone  (ROCEPHIN )  IV Stopped (12/04/23 2026)   dextrose  75 mL/hr at 12/05/23 1025     LOS: 1 day    Time spent: over 30 min    Meliton [name], MD [facility]   To contact the attending provider between 7A-7P or the covering provider during after hours 7P-7A, please log into the web site [email or web address] and access using universal Kyle [name] password for that web site. If you do not have the password, please call the hospital operator.  12/05/2023, 5:32 PM

## 2023-12-06 DIAGNOSIS — R0902 Hypoxemia: Secondary | ICD-10-CM | POA: Diagnosis not present

## 2023-12-06 LAB — BLOOD CULTURE ID PANEL (REFLEXED) - BCID2

## 2023-12-06 LAB — CBC WITH DIFFERENTIAL/PLATELET
Abs Immature Granulocytes: 0.06 10*3/uL (ref 0.00–0.07)
Basophils Absolute: 0 10*3/uL (ref 0.0–0.1)
Basophils Relative: 0 %
Eosinophils Absolute: 0 10*3/uL (ref 0.0–0.5)
Eosinophils Relative: 0 %
HCT: 30.8 % — ABNORMAL LOW (ref 39.0–52.0)
Hemoglobin: 9.4 g/dL — ABNORMAL LOW (ref 13.0–17.0)
Immature Granulocytes: 1 %
Lymphocytes Relative: 12 %
Lymphs Abs: 1.5 10*3/uL (ref 0.7–4.0)
MCH: 28.9 pg (ref 26.0–34.0)
MCHC: 30.5 g/dL (ref 30.0–36.0)
MCV: 94.8 fL (ref 80.0–100.0)
Monocytes Absolute: 0.8 10*3/uL (ref 0.1–1.0)
Monocytes Relative: 7 %
Neutro Abs: 9.7 10*3/uL — ABNORMAL HIGH (ref 1.7–7.7)
Neutrophils Relative %: 80 %
Platelets: 292 10*3/uL (ref 150–400)
RBC: 3.25 MIL/uL — ABNORMAL LOW (ref 4.22–5.81)
RDW: 16.3 % — ABNORMAL HIGH (ref 11.5–15.5)
WBC: 12.1 10*3/uL — ABNORMAL HIGH (ref 4.0–10.5)
nRBC: 0.2 % (ref 0.0–0.2)

## 2023-12-06 LAB — COMPREHENSIVE METABOLIC PANEL
ALT: 11 U/L (ref 0–44)
AST: 14 U/L — ABNORMAL LOW (ref 15–41)
Albumin: 2.3 g/dL — ABNORMAL LOW (ref 3.5–5.0)
Alkaline Phosphatase: 44 U/L (ref 38–126)
Anion gap: 8 (ref 5–15)
BUN: 36 mg/dL — ABNORMAL HIGH (ref 8–23)
CO2: 26 mmol/L (ref 22–32)
Calcium: 8.5 mg/dL — ABNORMAL LOW (ref 8.9–10.3)
Chloride: 111 mmol/L (ref 98–111)
Creatinine, Ser: 1.57 mg/dL — ABNORMAL HIGH (ref 0.61–1.24)
GFR, Estimated: 47 mL/min — ABNORMAL LOW (ref >60)
Glucose, Bld: 147 mg/dL — ABNORMAL HIGH (ref 70–99)
Potassium: 3.8 mmol/L (ref 3.5–5.1)
Sodium: 145 mmol/L (ref 135–145)
Total Bilirubin: 0.7 mg/dL (ref 0.0–1.2)
Total Protein: 6.4 g/dL — ABNORMAL LOW (ref 6.5–8.1)

## 2023-12-06 LAB — GLUCOSE, CAPILLARY
Glucose-Capillary: 138 mg/dL — ABNORMAL HIGH (ref 70–99)
Glucose-Capillary: 233 mg/dL — ABNORMAL HIGH (ref 70–99)
Glucose-Capillary: 197 mg/dL — ABNORMAL HIGH (ref 70–99)
Glucose-Capillary: 200 mg/dL — ABNORMAL HIGH (ref 70–99)

## 2023-12-06 LAB — BRAIN NATRIURETIC PEPTIDE: B Natriuretic Peptide: 277.7 pg/mL — ABNORMAL HIGH (ref 0.0–100.0)

## 2023-12-06 LAB — PHOSPHORUS: Phosphorus: 2.5 mg/dL (ref 2.5–4.6)

## 2023-12-06 LAB — MAGNESIUM: Magnesium: 2.3 mg/dL (ref 1.7–2.4)

## 2023-12-06 MED ORDER — PREDNISONE 20 MG PO TABS
40.0000 mg | ORAL_TABLET | Freq: Every day | ORAL | Status: DC
  Administered 2023-12-07 – 2023-12-08 (×2): 40 mg via ORAL
  Filled 2023-12-06 (×2): qty 2

## 2023-12-06 NOTE — Evaluation (Signed)
 Clinical/Bedside Swallow Evaluation Patient Details  Name: Kyle [name] MRN: [id number] Date of Birth: [birth year]  Today's Date: 12/06/2023 Time: SLP Start Time (ACUTE ONLY): 7829 SLP Stop Time (ACUTE ONLY): 0940 SLP Time Calculation (min) (ACUTE ONLY): 15 min  Past Medical History:  Past Medical History:  Diagnosis Date   Aortic insufficiency    MODERATE WITH A BICUSPID AORTIC VAVLE   Arterial occlusive disease    MULTILEVEL   Atrial fibrillation (HCC)    CHF (congestive heart failure) (HCC)    Chronic bronchitis (HCC)    CKD (chronic kidney disease) stage 3, GFR 30-59 ml/min (HCC)    COPD (chronic obstructive pulmonary disease) (HCC)    Dilated cardiomyopathy (HCC)    WITH EJECTION FRACTION DOWN TO 20-25%--WITH CONGESTIVE HEART FAILURE   Edema    LOWER EXTREMETIES   Emphysema of lung (HCC)    Hemorrhagic stroke (HCC) 09/06/2023   right side defecit   Hypertension    Insulin  dependent type 2 diabetes mellitus (HCC)    Normal coronary arteries 2009   Orthopnea    Peripheral arterial disease (HCC)    Pulmonary hypertension (HCC)    Past Surgical History:  Past Surgical History:  Procedure Laterality Date   ABDOMINAL AORTAGRAM N/A 11/05/2014   Procedure: ABDOMINAL Tommi Fraise;  Surgeon: Dannis Dy, MD;  Location: [facility] CATH LAB;  Service: Cardiovascular;  Laterality: N/A;   ABDOMINAL AORTOGRAM W/LOWER EXTREMITY Bilateral 04/21/2019   Procedure: ABDOMINAL AORTOGRAM W/LOWER EXTREMITY;  Surgeon: Dannis Dy, MD;  Location: [facility] INVASIVE CV LAB;  Service: Cardiovascular;  Laterality: Bilateral;   ABDOMINAL AORTOGRAM W/LOWER EXTREMITY Right 05/30/2021   Procedure: ABDOMINAL AORTOGRAM W/LOWER EXTREMITY;  Surgeon: Dannis Dy, MD;  Location: [facility] INVASIVE CV LAB;  Service: Cardiovascular;  Laterality: Right;   AORTA - BILATERAL FEMORAL ARTERY BYPASS GRAFT N/A 12/18/2014   Procedure: AORTOBIFEMORAL BYPASS GRAFT;  Surgeon: Dannis Dy, MD;  Location: [facility]  OR;  Service: Vascular;  Laterality: N/A;   CARDIAC CATHETERIZATION  2009   Nl Cors, EF 20%   COLONOSCOPY     ENDARTERECTOMY FEMORAL Left 12/18/2014   Procedure: ENDARTERECTOMY FEMORAL;  Surgeon: Dannis Dy, MD;  Location: [facility] OR;  Service: Vascular;  Laterality: Left;   FALSE ANEURYSM REPAIR Right 04/24/2019   Procedure: REPAIR OF RIGHT FEMORAL ARTERY PSEUDOANEURYSM;  Surgeon: Mayo Speck, MD;  Location: [facility] OR;  Service: Vascular;  Laterality: Right;   FEMORAL-POPLITEAL BYPASS GRAFT  02/21/2011   right   FEMORAL-TIBIAL BYPASS GRAFT Right 04/24/2019   Procedure: REDO BYPASS GRAFT RIGHT FEMORAL-TIBIAL ARTERY USING LEFT LEG VEIN & HEMASHIELD GOLD 8mm GRAFT;  Surgeon: Mayo Speck, MD;  Location: [facility] OR;  Service: Vascular;  Laterality: Right;   IR FLUORO GUIDE CV LINE RIGHT  12/28/2019   IR REMOVAL TUN CV CATH W/O FL  01/11/2020   IR US  GUIDE VASC ACCESS RIGHT  12/28/2019   OTHER SURGICAL HISTORY     POST RIGHT FEMOROPOPLITEAL BYPASS GRAFT   PERIPHERAL VASCULAR CATHETERIZATION  11/05/2014   Procedure: LOWER EXTREMITY ANGIOGRAPHY;  Surgeon: Dannis Dy, MD;  Location: [facility] CATH LAB;  Service: Cardiovascular;;   VEIN HARVEST Left 04/24/2019   Procedure: LEFT LEG SAPHENOUS VEIN HARVEST;  Surgeon: Mayo Speck, MD;  Location: [facility] OR;  Service: Vascular;  Laterality: Left;   HPI:  Patient is a 73 y.o. 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 He was recently hospitalized for COVID-19 diagnosis (11/22/23) and subsequently discharged back to SNF ([facility]). He presented back to the hospital on 12/04/23 with significant SOB and hypoxia. Viral screen only positive for COVID-19. He was admitted for acute on chronic hypoxic respiratory failure.    Assessment / Plan / Recommendation  Clinical Impression  Patient presents with questionable s/s of  dysphagia as per this bedside swallow evaluation. When SLP entered room, he had just finished his breakfast meal and his RN had given him his morning medications. Patient and RN both deny any observed s/s aspiration. Patient appears to a good historian, as his report is consistent with prior SLP notes. He had been on modified diet (Dys 2 (minced) solids and nectar thick liquids) when discharged from CIR in December but he was ultimately advanced to regular texture solids, thin liquids at SNF. During today's evaluation, patient politely declined any solids as he was full, but he was willing to drink some milk and juice. Swallow initiation appeared timely and no overt s/s occured during or immediately after sips of thin liquids. Patient did have a delayed cough which was brief and patient indicating that he sometimes coughs when he drinks too quickly. SLP plans to f/u at least once to ensure he is tolerating PO diet. SLP Visit Diagnosis: Dysphagia, unspecified (R13.10)    Aspiration Risk  Mild aspiration risk    Diet Recommendation Regular;Thin liquid    Liquid Administration via: Cup;Straw Medication Administration: Whole meds with liquid Supervision: Patient able to self feed Compensations: Slow rate;Small sips/bites Postural Changes: Seated upright at 90 degrees    Other  Recommendations Oral Care Recommendations: Oral care BID    Recommendations for follow up therapy are one component of a multi-disciplinary discharge planning process, led by the attending physician.  Recommendations may be updated based on patient status, additional functional criteria and insurance authorization.  Follow up Recommendations Other (comment) (TBD)      Assistance Recommended at Discharge    Functional Status Assessment Patient has had a recent decline in their functional status and demonstrates the ability to make significant improvements in function in a reasonable and predictable amount of time.  Frequency  and Duration min 1 x/week  1 week       Prognosis Prognosis for improved oropharyngeal function: Good      Swallow Study   General Date of Onset: 12/06/23 HPI: Patient is a 73 y.o. 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 He was recently hospitalized for COVID-19 diagnosis (11/22/23) and subsequently discharged back to SNF ([facility]). He presented back to the hospital on 12/04/23 with significant SOB and hypoxia. Viral screen only positive for COVID-19. He was admitted for acute on chronic hypoxic respiratory failure. Type of Study: Bedside Swallow Evaluation Previous Swallow Assessment: most recent BSE 11/23/23, prior to that BSE, FEES, swallow tx in CIR in December 2024 Diet Prior to this Study: Regular;Thin liquids (Level 0) Temperature Spikes Noted: No Respiratory Status: Nasal cannula History of Recent Intubation: No Behavior/Cognition: Alert;Cooperative;Pleasant mood Oral Cavity Assessment: Within Functional Limits Oral Care Completed by SLP: No Oral Cavity - Dentition: Dentures, top;Dentures, bottom Vision: Functional for self-feeding Self-Feeding Abilities: Able to feed self Patient Positioning: Upright in bed Baseline Vocal Quality: Normal Volitional Cough: Strong Volitional Swallow: Able to elicit    Oral/Motor/Sensory Function Overall Oral Motor/Sensory Function: Within functional limits   Ice  Chips     Thin Liquid Thin Liquid: Impaired Other Comments: one instance of delayed cough with patient telling SLP he does cough "when I drink too fast"    Nectar Thick     Honey Thick     Puree Puree: Not tested   Solid     Solid: Not tested      Jacqualine Mater, MA, CCC-SLP Speech Therapy

## 2023-12-06 NOTE — Plan of Care (Signed)
   Problem: Education: Goal: Knowledge of risk factors and measures for prevention of condition will improve Outcome: Progressing   Problem: Coping: Goal: Psychosocial and spiritual needs will be supported Outcome: Progressing   Problem: Respiratory: Goal: Will maintain a patent airway Outcome: Progressing Goal: Complications related to the disease process, condition or treatment will be avoided or minimized Outcome: Progressing

## 2023-12-06 NOTE — Progress Notes (Signed)
 PROGRESS NOTE    [name]  WUJ:[id number] DOB: 03-18-1951 DOA: 12/04/2023 PCP: Genora Kidd, MD  No chief complaint on file.   Brief Narrative:   Kyle [name] is Kyle [name] 73 y.o. 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  Patient was admitted to the hospital where he was treated and subsequently discharged to [facility] nursing home.  He was brought back again today with significant shortness of breath and hypoxia.   Currently being treated for possible CAP vs COPD exacerbation.  Assessment & Plan:   Active Problems:   Aortic insufficiency   Hypertension   Peripheral arterial disease (HCC)   Chronic systolic CHF (congestive heart failure) (HCC)   Pulmonary hypertension (HCC)   COPD (chronic obstructive pulmonary disease) (HCC)   Atrial fibrillation, chronic (HCC)   Diabetes mellitus type 2, insulin  dependent (HCC)   Hypoxia   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HCC)   Hypernatremia   Acute on chronic respiratory failure with hypoxemia (HCC)  Acute on Chronic Hypoxic Respiratory Failure  COPD with exacerbation  Discharged on 4.5 L from last hospitalization (brought into ED on NRB, on 9 L [place] at time of H&P) CXR with linear atelectasis at L lung base CT chest with severe emphysema, trace effusions  Continue abx while we treat as COPD exacerbation Continue steroids for now Notably recently treated for covid, diagnosed 1/27 - CT without evidence of pneumonia - will d/c isolation as he's beyond 10 days  Recent COVID 19  infection D/c isolation  Hypernatremia Follow on hypotonic IVF  HFpEF Severe Aortic Valve Stenosis  Echo from 08/2023 with EF 55-60%, no RWMA, grade 1 diastolic dysfunction, normal PASP Appears euvolemic   T2DM SSI  Atrial fibrillation Eliquis   Hx Hemorrhagic CVA Residual dysarthria and R sided weakness Per 11/26 neuro note, had restarted anticoagulation Keppra   Hypertension Amlodipine  was held on discharge Will hold hydralazine  for now Resume clonidine  and coreg   Neuropathic Pain Gabapentin   Insomnia Remeron   Dyslipidemia Crestor   GERD PPI     DVT prophylaxis: eliquis  Code Status: full Family Communication: none Disposition:   Status is: Inpatient Remains inpatient appropriate because: need for continued inpatient care   Consultants:  none  Procedures:  none  Antimicrobials:  Anti-infectives (From admission, onward)    Start     Dose/Rate Route Frequency Ordered Stop   12/04/23 2030  cefTRIAXone  (ROCEPHIN ) 2 g in sodium chloride  0.9 % 100 mL IVPB        2 g 200 mL/hr over 30 Minutes Intravenous Every 24 hours 12/04/23 1939     12/04/23 2030  azithromycin  (ZITHROMAX ) 500 mg in sodium chloride  0.9 % 250 mL IVPB        500 mg 250 mL/hr over 60 Minutes Intravenous Every 24 hours 12/04/23 1939         Subjective: No new complaints  Objective: Vitals:   12/06/23 0916 12/06/23 1023 12/06/23 1028 12/06/23 1513  BP: 135/66   135/66  Pulse:      Resp:      Temp:      TempSrc:      SpO2:  97% 97%  Intake/Output Summary (Last 24 hours) at 12/06/2023 1804 Last data filed at 12/06/2023 1000 Gross per 24 hour  Intake 60 ml  Output --  Net 60 ml   There were no vitals filed for this visit.  Examination:  General: No acute distress. Cardiovascular: RRR Lungs: diminished Neurological: R sided weakness, dysarthria Extremities: No clubbing or cyanosis. No edema.   Data Reviewed: I have personally reviewed following labs and imaging  studies  CBC: Recent Labs  Lab 12/04/23 1510 12/05/23 0442 12/06/23 0358  WBC 10.1 9.4 12.1*  NEUTROABS 7.8*  --  9.7*  HGB 10.5* 10.3* 9.4*  HCT 34.3* 34.1* 30.8*  MCV 94.5 95.3 94.8  PLT 295 288 292    Basic Metabolic Panel: Recent Labs  Lab 12/04/23 1510 12/05/23 0442 12/05/23 1553 12/06/23 0358  NA 147* 148* 145 145  K 4.1 4.3 4.1 3.8  CL 113* 113* 109 111  CO2 21* 21* 23 26  GLUCOSE 126* 172* 258* 147*  BUN 28* 31* 36* 36*  CREATININE 1.70* 1.69* 1.79* 1.57*  CALCIUM  8.3* 8.4* 8.6* 8.5*  MG  --   --   --  2.3  PHOS  --   --   --  2.5    GFR: CrCl cannot be calculated (Unknown ideal weight.).  Liver Function Tests: Recent Labs  Lab 12/04/23 1510 12/05/23 0442 12/06/23 0358  AST 17 14* 14*  ALT 12 12 11   ALKPHOS 46 50 44  BILITOT 1.2 0.9 0.7  PROT 6.4* 6.9 6.4*  ALBUMIN  2.6* 2.5* 2.3*    CBG: Recent Labs  Lab 12/05/23 1611 12/05/23 2138 12/06/23 0751 12/06/23 1146 12/06/23 1606  GLUCAP 233* 162* 138* 233* 197*     Recent Results (from the past 240 hours)  Resp panel by RT-PCR (RSV, Flu Lavora Brisbon&B, Covid) Anterior Nasal Swab     Status: Abnormal   Collection Time: 12/04/23  3:10 PM   Specimen: Anterior Nasal Swab  Result Value Ref Range Status   SARS Coronavirus 2 by RT PCR POSITIVE (Tamalyn Wadsworth) NEGATIVE Final    Comment: (NOTE) SARS-CoV-2 target nucleic acids are DETECTED.  The SARS-CoV-2 RNA is generally detectable in upper respiratory specimens during the acute phase of infection. Positive results are indicative of the presence of the identified virus, but do not rule out bacterial infection or co-infection with other pathogens not detected by the test. Clinical correlation with patient history and other diagnostic information is necessary to determine patient infection status. The expected result is Negative.  Fact Sheet for Patients: BloggerCourse.com  Fact Sheet for Healthcare  Providers: SeriousBroker.it  This test is not yet approved or cleared by the [place]  FDA and  has been authorized for detection and/or diagnosis of SARS-CoV-2 by FDA under an Emergency Use Authorization (EUA).  This EUA will remain in effect (meaning this test can be used) for the duration of  the COVID-19 declaration under Section 564(b)(1) of the [place] ct, 21 U.S.C. section 360bbb-3(b)(1), unless the authorization is terminated or revoked sooner.     Influenza Harsha Yusko by PCR NEGATIVE NEGATIVE Final   Influenza B by PCR NEGATIVE NEGATIVE Final    Comment: (NOTE) The Xpert Xpress SARS-CoV-2/FLU/RSV plus assay is intended as an aid in the diagnosis of influenza from Nasopharyngeal swab specimens and should not be used as Aden Sek sole basis for treatment. Nasal washings and aspirates are unacceptable for Xpert Xpress SARS-CoV-2/FLU/RSV testing.  Fact Sheet for Patients: BloggerCourse.com  Fact Sheet for Healthcare Providers: SeriousBroker.it  This test is not  yet approved or cleared by the [place]  FDA and has been authorized for detection and/or diagnosis of SARS-CoV-2 by FDA under an Emergency Use Authorization (EUA). This EUA will remain in effect (meaning this test can be used) for the duration of the COVID-19 declaration under Section 564(b)(1) of the Act, 21 U.S.C. section 360bbb-3(b)(1), unless the authorization is terminated or revoked.     Resp Syncytial Virus by PCR NEGATIVE NEGATIVE Final    Comment: (NOTE) Fact Sheet for Patients: BloggerCourse.com  Fact Sheet for Healthcare Providers: SeriousBroker.it  This test is not yet approved or cleared by the [place]  FDA and has been authorized for detection and/or diagnosis of SARS-CoV-2 by FDA under an Emergency Use Authorization (EUA). This EUA will remain in effect (meaning this test can be  used) for the duration of the COVID-19 declaration under Section 564(b)(1) of the Act, 21 U.S.C. section 360bbb-3(b)(1), unless the authorization is terminated or revoked.  Performed at [facility], [address] [place], [name] [address]   Blood culture (routine x 2)     Status: None (Preliminary result)   Collection Time: 12/04/23  3:19 PM   Specimen: BLOOD  Result Value Ref Range Status   Specimen Description   Final    BLOOD BLOOD LEFT ARM Performed at [facility], [address], [place], [name] [address]    Special Requests   Final    BOTTLES DRAWN AEROBIC ONLY Blood Culture results may not be optimal due to an inadequate volume of blood received in culture bottles Performed at [facility], [address], [place], [name] [address]    Culture  Setup Time   Final    GRAM POSITIVE COCCI AEROBIC BOTTLE ONLY CRITICAL RESULT CALLED TO, READ BACK BY AND VERIFIED WITH: PHARMD M LILLISTON 12/06/2023 @ 0134 BY AB    Culture   Final    GRAM POSITIVE COCCI CULTURE REINCUBATED FOR BETTER GROWTH Performed at [facility], [address], [place], [name] [address]    Report Status PENDING  Incomplete  Blood Culture ID Panel (Reflexed)     Status: Abnormal   Collection Time: 12/04/23  3:19 PM  Result Value Ref Range Status   Enterococcus faecalis NOT DETECTED NOT DETECTED Final   Enterococcus Faecium NOT DETECTED NOT DETECTED Final   Listeria monocytogenes NOT DETECTED NOT DETECTED Final   Staphylococcus species DETECTED (Melba Araki) NOT DETECTED Final    Comment: CRITICAL RESULT CALLED TO, READ BACK BY AND VERIFIED WITH: PHARMD M LILLISTON 12/06/2023 @ 0134 BY AB    Staphylococcus aureus (BCID) NOT DETECTED NOT DETECTED Final   Staphylococcus epidermidis DETECTED (Gibson Telleria) NOT DETECTED Final    Comment: CRITICAL RESULT CALLED TO, READ BACK BY AND VERIFIED WITH: PHARMD M LILLISTON 12/06/2023 @ 0134 BY AB    Staphylococcus  lugdunensis NOT DETECTED NOT DETECTED Final   Streptococcus species NOT DETECTED NOT DETECTED Final   Streptococcus agalactiae NOT DETECTED NOT DETECTED Final   Streptococcus pneumoniae NOT DETECTED NOT DETECTED Final   Streptococcus pyogenes NOT DETECTED NOT DETECTED Final   Charnee Turnipseed.calcoaceticus-baumannii NOT DETECTED NOT DETECTED Final   Bacteroides fragilis NOT DETECTED NOT DETECTED Final   Enterobacterales NOT DETECTED NOT DETECTED Final   Enterobacter cloacae complex NOT DETECTED NOT DETECTED Final   Escherichia coli NOT DETECTED NOT DETECTED Final   Klebsiella aerogenes NOT DETECTED NOT DETECTED Final   Klebsiella oxytoca NOT DETECTED NOT DETECTED Final   Klebsiella pneumoniae NOT DETECTED NOT DETECTED  Final   Proteus species NOT DETECTED NOT DETECTED Final   Salmonella species NOT DETECTED NOT DETECTED Final   Serratia marcescens NOT DETECTED NOT DETECTED Final   Haemophilus influenzae NOT DETECTED NOT DETECTED Final   Neisseria meningitidis NOT DETECTED NOT DETECTED Final   Pseudomonas aeruginosa NOT DETECTED NOT DETECTED Final   Stenotrophomonas maltophilia NOT DETECTED NOT DETECTED Final   Candida albicans NOT DETECTED NOT DETECTED Final   Candida auris NOT DETECTED NOT DETECTED Final   Candida glabrata NOT DETECTED NOT DETECTED Final   Candida krusei NOT DETECTED NOT DETECTED Final   Candida parapsilosis NOT DETECTED NOT DETECTED Final   Candida tropicalis NOT DETECTED NOT DETECTED Final   Cryptococcus neoformans/gattii NOT DETECTED NOT DETECTED Final   Methicillin resistance mecA/C NOT DETECTED NOT DETECTED Final    Comment: Performed at [facility], [address], [place], [name] [address]  Blood culture (routine x 2)     Status: None (Preliminary result)   Collection Time: 12/04/23 10:06 PM   Specimen: BLOOD  Result Value Ref Range Status   Specimen Description   Final    BLOOD BLOOD LEFT HAND AEROBIC BOTTLE ONLY Performed at [facility], [address], [place], [name] [address]    Special Requests   Final    Blood Culture results may not be optimal due to an inadequate volume of blood received in culture bottles BOTTLES DRAWN AEROBIC ONLY Performed at [facility], [address], [place], [name] [address]    Culture   Final    NO GROWTH < 12 HOURS Performed at [facility], [address] [place], [name] [address]    Report Status PENDING  Incomplete  MRSA Next Gen by PCR, Nasal     Status: None   Collection Time: 12/05/23 12:35 AM   Specimen: Nasal Mucosa; Nasal Swab  Result Value Ref Range Status   MRSA by PCR Next Gen NOT DETECTED NOT DETECTED Final    Comment: (NOTE) The GeneXpert MRSA Assay (FDA approved for NASAL specimens only), is one component of Demarqus Jocson comprehensive MRSA colonization surveillance program. It is not intended to diagnose MRSA infection nor to guide or monitor treatment for MRSA infections. Test performance is not FDA approved in patients less than 54 years old. Performed at [facility], [address] [place], [name] [address]          Radiology Studies: CT CHEST WO CONTRAST Result Date: 12/05/2023 CLINICAL DATA:  Respiratory illness EXAM: CT CHEST WITHOUT CONTRAST TECHNIQUE: Multidetector CT imaging of the chest was performed following the standard protocol without IV contrast. RADIATION DOSE REDUCTION: This exam was performed according to the departmental dose-optimization program which includes automated exposure control, adjustment of the mA and/or kV according to patient size and/or use of iterative reconstruction technique. COMPARISON:  09/06/2023 FINDINGS: Cardiovascular: Aortic atherosclerosis. Dense aortic valve calcifications. Cardiomegaly. Three-vessel coronary artery calcifications. No pericardial effusion. Mediastinum/Nodes: No enlarged mediastinal, hilar, or axillary lymph nodes. Thyroid gland, trachea, and esophagus demonstrate no  significant findings. Lungs/Pleura: Severe emphysema. Bibasilar scarring or atelectasis. Trace bilateral pleural effusions. Upper Abdomen: No acute abnormality. Musculoskeletal: No chest wall abnormality. No acute osseous findings. IMPRESSION: 1. Severe emphysema. 2. Trace bilateral pleural effusions. 3. Bibasilar scarring or atelectasis. 4. Cardiomegaly and coronary artery disease. 5. Dense aortic valve calcifications. Correlate for echocardiographic evidence of aortic valve dysfunction. Aortic Atherosclerosis (ICD10-I70.0) and Emphysema (ICD10-J43.9). Electronically Signed   By: Evonne Hoist.D.  On: 12/05/2023 18:03        Scheduled Meds:  apixaban   5 mg Oral BID   carvedilol   6.25 mg Oral BID WC   cloNIDine   0.2 mg Oral 3 times per day   fluticasone  furoate-vilanterol  1 puff Inhalation Daily   And   umeclidinium bromide   1 puff Inhalation Daily   gabapentin   600 mg Oral QHS   insulin  aspart  0-15 Units Subcutaneous TID WC   insulin  aspart  0-5 Units Subcutaneous QHS   ipratropium-albuterol   3 mL Nebulization BID   levETIRAcetam   500 mg Oral BID   mirtazapine   7.5 mg Oral QHS   pantoprazole   40 mg Oral Daily   [START ON 12/07/2023] predniSONE   40 mg Oral Q breakfast   rosuvastatin   10 mg Oral QHS   Continuous Infusions:  azithromycin  500 mg (12/05/23 2000)   cefTRIAXone  (ROCEPHIN )  IV 2 g (12/05/23 2110)     LOS: 2 days    Time spent: over 30 min    Donnetta Gains, MD [facility]   To contact the attending provider between 7A-7P or the covering provider during after hours 7P-7A, please log into the web site [email or web address] and access using universal [place] password for that web site. If you do not have the password, please call the hospital operator.  12/06/2023, 6:04 PM

## 2023-12-06 NOTE — Progress Notes (Signed)
 PHARMACY - PHYSICIAN COMMUNICATION CRITICAL VALUE ALERT - BLOOD CULTURE IDENTIFICATION (BCID)  Kyle [name] is an 73 y.o. male who presented to [facility] on 12/04/2023 with a chief complaint of hypoxia.  Assessment:  Recently admitted with COVID PNA 1/27-1/28 and returns with worsened hypoxia.   1/2 blood cx + Staph epi (only aerobic bottles collected).   Likely contaminant.    Name of physician (or Provider) Contacted: Laurence Pons, NP  Current antibiotics: Rocephin  + Zithromax   Changes to prescribed antibiotics recommended:  No changes to antibiotics at this time.   Results for orders placed or performed during the hospital encounter of 12/04/23  Blood Culture ID Panel (Reflexed) (Collected: 12/04/2023  3:19 PM)  Result Value Ref Range   Enterococcus faecalis NOT DETECTED NOT DETECTED   Enterococcus Faecium NOT DETECTED NOT DETECTED   Listeria monocytogenes NOT DETECTED NOT DETECTED   Staphylococcus species DETECTED (A) NOT DETECTED   Staphylococcus aureus (BCID) NOT DETECTED NOT DETECTED   Staphylococcus epidermidis DETECTED (A) NOT DETECTED   Staphylococcus lugdunensis NOT DETECTED NOT DETECTED   Streptococcus species NOT DETECTED NOT DETECTED   Streptococcus agalactiae NOT DETECTED NOT DETECTED   Streptococcus pneumoniae NOT DETECTED NOT DETECTED   Streptococcus pyogenes NOT DETECTED NOT DETECTED   A.calcoaceticus-baumannii NOT DETECTED NOT DETECTED   Bacteroides fragilis NOT DETECTED NOT DETECTED   Enterobacterales NOT DETECTED NOT DETECTED   Enterobacter cloacae complex NOT DETECTED NOT DETECTED   Escherichia coli NOT DETECTED NOT DETECTED   Klebsiella aerogenes NOT DETECTED NOT DETECTED   Klebsiella oxytoca NOT DETECTED NOT DETECTED   Klebsiella pneumoniae NOT DETECTED NOT DETECTED   Proteus species NOT DETECTED NOT DETECTED   Salmonella species NOT DETECTED NOT DETECTED   Serratia marcescens NOT DETECTED NOT DETECTED   Haemophilus influenzae NOT DETECTED NOT DETECTED    Neisseria meningitidis NOT DETECTED NOT DETECTED   Pseudomonas aeruginosa NOT DETECTED NOT DETECTED   Stenotrophomonas maltophilia NOT DETECTED NOT DETECTED   Candida albicans NOT DETECTED NOT DETECTED   Candida auris NOT DETECTED NOT DETECTED   Candida glabrata NOT DETECTED NOT DETECTED   Candida krusei NOT DETECTED NOT DETECTED   Candida parapsilosis NOT DETECTED NOT DETECTED   Candida tropicalis NOT DETECTED NOT DETECTED   Cryptococcus neoformans/gattii NOT DETECTED NOT DETECTED   Methicillin resistance mecA/C NOT DETECTED NOT DETECTED    Arie Kurtz PharmD 12/06/2023  1:38 AM

## 2023-12-06 NOTE — Inpatient Diabetes Management (Signed)
 Inpatient Diabetes Program Recommendations  AACE/ADA: New Consensus Statement on Inpatient Glycemic Control   Target Ranges:  Prepandial:   less than 140 mg/dL      Peak postprandial:   less than 180 mg/dL (1-2 hours)      Critically ill patients:  140 - 180 mg/dL    Latest Reference Range & Units 12/05/23 08:56 12/05/23 11:28 12/05/23 16:11 12/05/23 21:38 12/06/23 07:51 12/06/23 11:46  Glucose-Capillary 70 - 99 mg/dL 161 (H) 096 (H) 045 (H) 162 (H) 138 (H) 233 (H)   Review of Glycemic Control  Diabetes history: DM2 Outpatient Diabetes medications: Tresiba  5 units daily, Novolog  0-10 units TID with meals Current orders for Inpatient glycemic control: Novolog  0-15 units TID with meals, Novolog  0-5 units at bedtime; Solumedrol 40 mg Q12H  Inpatient Diabetes Program Recommendations:    Insulin : If steroids are continued, please consider ordering Novolog  3 units TID with meals for meal coverage if patient eats at least 50% of meals.  Thanks, Beacher Limerick, RN, MSN, CDCES Diabetes Coordinator Inpatient Diabetes Program [phone] (Team Pager from 8am to 5pm)

## 2023-12-07 DIAGNOSIS — R0902 Hypoxemia: Secondary | ICD-10-CM | POA: Diagnosis not present

## 2023-12-07 LAB — GLUCOSE, CAPILLARY
Glucose-Capillary: 114 mg/dL — ABNORMAL HIGH (ref 70–99)
Glucose-Capillary: 182 mg/dL — ABNORMAL HIGH (ref 70–99)
Glucose-Capillary: 248 mg/dL — ABNORMAL HIGH (ref 70–99)
Glucose-Capillary: 228 mg/dL — ABNORMAL HIGH (ref 70–99)

## 2023-12-07 LAB — PHOSPHORUS: Phosphorus: 1.9 mg/dL — ABNORMAL LOW (ref 2.5–4.6)

## 2023-12-07 LAB — CBC WITH DIFFERENTIAL/PLATELET
Abs Immature Granulocytes: 0.04 10*3/uL (ref 0.00–0.07)
Basophils Absolute: 0 10*3/uL (ref 0.0–0.1)
Basophils Relative: 0 %
Eosinophils Absolute: 0 10*3/uL (ref 0.0–0.5)
Eosinophils Relative: 0 %
HCT: 29.9 % — ABNORMAL LOW (ref 39.0–52.0)
Hemoglobin: 9.2 g/dL — ABNORMAL LOW (ref 13.0–17.0)
Immature Granulocytes: 0 %
Lymphocytes Relative: 15 %
Lymphs Abs: 1.6 10*3/uL (ref 0.7–4.0)
MCH: 28.3 pg (ref 26.0–34.0)
MCHC: 30.8 g/dL (ref 30.0–36.0)
MCV: 92 fL (ref 80.0–100.0)
Monocytes Absolute: 0.8 10*3/uL (ref 0.1–1.0)
Monocytes Relative: 7 %
Neutro Abs: 8 10*3/uL — ABNORMAL HIGH (ref 1.7–7.7)
Neutrophils Relative %: 78 %
Platelets: 271 10*3/uL (ref 150–400)
RBC: 3.25 MIL/uL — ABNORMAL LOW (ref 4.22–5.81)
RDW: 16.1 % — ABNORMAL HIGH (ref 11.5–15.5)
WBC: 10.4 10*3/uL (ref 4.0–10.5)
nRBC: 0.3 % — ABNORMAL HIGH (ref 0.0–0.2)

## 2023-12-07 LAB — COMPREHENSIVE METABOLIC PANEL
ALT: 13 U/L (ref 0–44)
AST: 16 U/L (ref 15–41)
Albumin: 2.2 g/dL — ABNORMAL LOW (ref 3.5–5.0)
Alkaline Phosphatase: 41 U/L (ref 38–126)
Anion gap: 10 (ref 5–15)
BUN: 33 mg/dL — ABNORMAL HIGH (ref 8–23)
CO2: 24 mmol/L (ref 22–32)
Calcium: 8.3 mg/dL — ABNORMAL LOW (ref 8.9–10.3)
Chloride: 108 mmol/L (ref 98–111)
Creatinine, Ser: 1.37 mg/dL — ABNORMAL HIGH (ref 0.61–1.24)
GFR, Estimated: 55 mL/min — ABNORMAL LOW (ref >60)
Glucose, Bld: 130 mg/dL — ABNORMAL HIGH (ref 70–99)
Potassium: 4 mmol/L (ref 3.5–5.1)
Sodium: 142 mmol/L (ref 135–145)
Total Bilirubin: 0.6 mg/dL (ref 0.0–1.2)
Total Protein: 6 g/dL — ABNORMAL LOW (ref 6.5–8.1)

## 2023-12-07 LAB — MAGNESIUM: Magnesium: 2.1 mg/dL (ref 1.7–2.4)

## 2023-12-07 LAB — CULTURE, BLOOD (ROUTINE X 2): Culture  Setup Time: NO GROWTH

## 2023-12-07 MED ORDER — CARMEX CLASSIC LIP BALM EX OINT
TOPICAL_OINTMENT | CUTANEOUS | Status: DC | PRN
  Filled 2023-12-07: qty 10

## 2023-12-07 NOTE — Evaluation (Signed)
Occupational Therapy Evaluation Patient Details Name: Kyle [name] MRN: [id number] DOB: 06-07-1951 Today's Date: 12/07/2023   History of Present Illness   Patient is a 73 year old male who was admitted from SNF with acute on chronic respiratory failure. 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     Clinical Impressions Patient is a 73 year old male who was admitted for above. Patient was at [facility] for short term rehab prior to hospitalization. Patient was +2 to advance to EOB with increased time. Patient needing CGA to min A for sitting balance EOB with patient having LOB while attempting to engage in scooting with patient picking LLE up off the floor. Patient was noted to have decreased functional activity tolerance, decreased endurance, decreased standing balance, decreased safety awareness, and decreased knowledge of AD/AE impacting participation in ADLs. Patient will benefit from continued inpatient follow up therapy, <3 hours/day      If plan is discharge home, recommend the following:   A lot of help with walking and/or transfers;Two people to help with walking and/or transfers;A lot of help with bathing/dressing/bathroom;Assistance with cooking/housework     Functional Status Assessment   Patient has had a recent decline in their functional status and demonstrates the ability to make significant improvements in function in a reasonable and predictable amount of time.     Equipment Recommendations   None recommended by OT      Precautions/Restrictions   Precautions Precautions: Fall Precaution/Restrictions Comments: R hemiplegia Restrictions Weight Bearing Restrictions Per Provider Order: No Other Position/Activity Restrictions: has AFOs and hand splints at SNF     Mobility Bed Mobility Overal bed mobility: Needs Assistance       Supine to sit: +2 for physical  assistance, +2 for safety/equipment, Max assist Sit to supine: Max assist, +2 for physical assistance, +2 for safety/equipment   General bed mobility comments: patient asked for therapy to move him to EOB.    Transfers                          Balance Overall balance assessment: Needs assistance Sitting-balance support: Feet supported Sitting balance-Leahy Scale: Fair                                     ADL either performed or assessed with clinical judgement   ADL Overall ADL's : Needs assistance/impaired Eating/Feeding: Set up;Sitting Eating/Feeding Details (indicate cue type and reason): with using LUE. Grooming: Minimal assistance;Bed level   Upper Body Bathing: Bed level;Moderate assistance   Lower Body Bathing: Bed level;Maximal assistance   Upper Body Dressing : Bed level;Maximal assistance   Lower Body Dressing: Bed level;Total assistance     Toilet Transfer Details (indicate cue type and reason): +2 to advance to EOB                 Vision Patient Visual Report: No change from baseline       Perception         Praxis         Pertinent Vitals/Pain Pain Assessment Pain Assessment: Faces Faces Pain Scale: Hurts little more Pain Location: "all over" Pain Descriptors / Indicators: Discomfort Pain Intervention(s): Limited activity within patient's tolerance, Monitored during session, Repositioned     Extremity/Trunk Assessment Upper Extremity Assessment Upper Extremity Assessment: RUE  deficits/detail RUE Deficits / Details: R flaccid hemi, some shoulder functioning  (shrugging) still intact. Sensatin intact per pt report. Skin breakdown on R palm. sensative to tocuh.   Lower Extremity Assessment Lower Extremity Assessment: RLE deficits/detail RLE Deficits / Details: flaccid   Cervical / Trunk Assessment Cervical / Trunk Assessment: Normal   Communication     Cognition Arousal: Alert Behavior During Therapy: WFL  for tasks assessed/performed Cognition: Difficult to assess             OT - Cognition Comments: noted to have dysarthric speech during session.h/o stroke,                 Following commands: Impaired       Cueing  General Comments          Exercises     Shoulder Instructions      Home Living Family/patient expects to be discharged to:: Skilled nursing facility                                 Additional Comments: Uses 4.5L at baseline      Prior Functioning/Environment Prior Level of Function : Needs assist       Physical Assist : Mobility (physical);ADLs (physical) Mobility (physical): Bed mobility;Transfers;Gait ADLs (physical): Grooming;Bathing;Dressing;Toileting;IADLs Mobility Comments: went from CIR to SNF. pt reports working with therapy at SNF ADLs Comments: requires assist    OT Problem List: Impaired balance (sitting and/or standing);Decreased strength;Impaired tone;Impaired UE functional use;Pain   OT Treatment/Interventions: Self-care/ADL training;Therapeutic exercise;DME and/or AE instruction;Energy conservation;Cognitive remediation/compensation;Balance training;Therapeutic activities;Patient/family education      OT Goals(Current goals can be found in the care plan section)   Acute Rehab OT Goals Patient Stated Goal: none stated OT Goal Formulation: With patient Time For Goal Achievement: 12/21/23 Potential to Achieve Goals: Fair   OT Frequency:  Min 1X/week       AM-PAC OT "6 Clicks" Daily Activity     Outcome Measure Help from another person eating meals?: A Little Help from another person taking care of personal grooming?: A Little Help from another person toileting, which includes using toliet, bedpan, or urinal?: Total Help from another person bathing (including washing, rinsing, drying)?: A Lot Help from another person to put on and taking off regular upper body clothing?: A Lot Help from another person to  put on and taking off regular lower body clothing?: Total 6 Click Score: 12   End of Session    Activity Tolerance: Patient tolerated treatment well Patient left: in bed;with call bell/phone within reach;with bed alarm set  OT Visit Diagnosis: Unsteadiness on feet (R26.81);Hemiplegia and hemiparesis                Time: 1130-1148 OT Time Calculation (min): 18 min Charges:  OT General Charges $OT Visit: 1 Visit OT Evaluation $OT Eval Low Complexity: 1 Low  Ousmane Seeman OTR/L, MS Acute Rehabilitation Department Office# [phone]   Selinda Flavin 12/07/2023, 12:44 PM

## 2023-12-07 NOTE — TOC Initial Note (Addendum)
Transition of Care [facility]) - Initial/Assessment Note    Patient Details  Name: Kyle [name] MRN: [id number] Date of Birth: 11/15/1950  [facility] CM/SW Contact:    Larrie Kass, LCSW Phone Number: 12/07/2023, 2:01 PM  Clinical Narrative:                 CSW spoke with the pt and the pt's spouse. They reported that the patient is from [place]. CSW spoke with Bjorn Loser, who confirmed that the patient is a short-term rehab resident at the facility. She reported that the pt will need insurance authorization to return. The patient stated that he would like to return to [place]. CSW will start the insurance authorization process. TOC to follow.    Adden  2:18pm [employer] pending. TOC to follow.     Patient Goals and CMS Choice            Expected Discharge Plan and Services                                              Prior Living Arrangements/Services                       Activities of Daily Living      Permission Sought/Granted                  Emotional Assessment              Admission diagnosis:  Shortness of breath [R06.02] Hypoxia [R09.02] Acute on chronic respiratory failure with hypoxemia (HCC) [J96.21] Acute cough [R05.1] Patient Active Problem List   Diagnosis Date Noted   Acute on chronic respiratory failure with hypoxemia (HCC) 12/04/2023   Acute hypoxic respiratory failure (HCC)  COVID 11/22/2023   Hypernatremia 11/22/2023   Chronic kidney disease, stage 3 unspecified (HCC) 11/22/2023   Type 2 diabetes mellitus (HCC) 11/22/2023   Elevated troponin 11/22/2023   Right foot ulcer (HCC) 11/22/2023   Pressure injury of skin 10/05/2023   Dysphagia 10/05/2023   Hip pain, right 10/05/2023   Pseudoaneurysm (HCC) 09/10/2023   ICH (intracerebral hemorrhage) (HCC) 09/10/2023   Hemorrhagic stroke (HCC) 09/06/2023   Hand pain, right 10/12/2022   Chronic respiratory failure with hypoxia (HCC)  01/29/2022   Atrial flutter (HCC)    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HCC) 02/20/2020   Pulmonary nodules/lesions, multiple 01/29/2020   Anticoagulant long-term use 01/29/2020   Hypoxia    SOB (shortness of breath)    Atrial fibrillation, chronic (HCC) 12/24/2019   Diabetes mellitus type 2, insulin dependent (HCC) 12/24/2019   Healthcare maintenance 07/14/2019   Medication management 07/14/2019   PAD (peripheral artery disease) (HCC) 04/22/2019   Atherosclerosis of artery of extremity with rest pain (HCC) 04/21/2019   Deformity of metatarsal bone of right foot 04/17/2019   COPD (chronic obstructive pulmonary disease) (HCC) 12/16/2018   Elevated PSA 12/27/2017   Protein-calorie malnutrition, severe (HCC) 12/18/2014   Aortoiliac occlusive disease (HCC) 12/16/2014   PVD (peripheral vascular disease) (HCC) 10/24/2014   Aortic stenosis 09/12/2014   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   Pulmonary hypertension (HCC) 09/12/2014   Dyslipidemia 09/12/2014   Atherosclerosis of native arteries of extremity with intermittent claudication (HCC) 05/11/2012   Aortic insufficiency    Hypertension    Peripheral arterial disease (HCC)    Dilated cardiomyopathy (HCC)  Chronic systolic CHF (congestive heart failure) (HCC)    PCP:  Levin Erp, MD Pharmacy:   [facility] - [place], [name] - [address] [name] [address] Phone: [phone] Fax: [phone]     Social Drivers of Health (SDOH) Social History: SDOH Screenings   Food Insecurity: No Food Insecurity (12/05/2023)  Housing: Low Risk  (12/05/2023)  Transportation Needs: No Transportation Needs (12/05/2023)  Utilities: Not At Risk (12/05/2023)  Alcohol Screen: Low Risk  (03/05/2023)  Depression (PHQ2-9): Low Risk  (03/05/2023)  Financial Resource Strain: Low Risk  (03/05/2023)  Physical Activity: Sufficiently Active (03/05/2023)  Social Connections: Moderately Isolated (12/05/2023)   Stress: No Stress Concern Present (03/05/2023)  Tobacco Use: Medium Risk (11/22/2023)   SDOH Interventions:     Readmission Risk Interventions     No data to display

## 2023-12-07 NOTE — Progress Notes (Signed)
 PROGRESS NOTE    Kyle [name]  ZOX:[id number] DOB: 03-08-1951 DOA: 12/04/2023 PCP: Kyle Erp, MD  No chief complaint on file.   Brief Narrative:   Kyle [name] is Kyle [name] 73 y.o. 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  Patient was admitted to the hospital where he was treated and subsequently discharged to [facility] nursing home.  He was brought back again today with significant shortness of breath and hypoxia.   Currently being treated for possible CAP vs COPD exacerbation.  Appears he's back to his baseline oxygen.  Awaiting SNF   Assessment & Plan:   Active Problems:   Aortic insufficiency   Hypertension   Peripheral arterial disease (HCC)   Chronic systolic CHF (congestive heart failure) (HCC)   Pulmonary hypertension (HCC)   COPD (chronic obstructive pulmonary disease) (HCC)   Atrial fibrillation, chronic (HCC)   Diabetes mellitus type 2, insulin dependent (HCC)   Hypoxia   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HCC)   Hypernatremia   Acute on chronic respiratory failure with hypoxemia (HCC)  Acute on Chronic Hypoxic Respiratory Failure  COPD with exacerbation  Discharged on 4.5 L from last hospitalization (brought into ED on NRB, on 9 L [place] at time of H&P) He's currently on 5 L, will work on weaning to his baseline.   CXR with linear atelectasis at L lung base CT chest with severe emphysema, trace effusions  Continue abx while we treat as COPD exacerbation Continue steroids for now Notably recently treated for  covid, diagnosed 1/27 - CT without evidence of pneumonia - will d/c isolation as he's beyond 10 days  Recent COVID 19 infection D/c isolation  Hypernatremia Resolved with IVF  HFpEF Severe Aortic Valve Stenosis  Echo from 08/2023 with EF 55-60%, no RWMA, grade 1 diastolic dysfunction, normal PASP Appears euvolemic   T2DM SSI  Atrial fibrillation Eliquis  Hx Hemorrhagic CVA Residual dysarthria and R sided weakness Per 11/26 neuro note, had restarted anticoagulation Keppra  Hypertension Amlodipine was held on discharge during last hospitalization Will hold hydralazine for now Resume clonidine and coreg  Neuropathic Pain Gabapentin  Insomnia Remeron  Dyslipidemia Crestor  GERD PPI     DVT prophylaxis: eliquis Code Status: full Family Communication: wife at bedside Disposition:   Status is: Inpatient Remains inpatient appropriate because: need for continued inpatient care   Consultants:  none  Procedures:  none  Antimicrobials:  Anti-infectives (From admission, onward)    Start     Dose/Rate Route Frequency Ordered Stop   12/04/23 2030  cefTRIAXone (ROCEPHIN) 2 g in sodium chloride 0.9 % 100 mL IVPB        2 g 200 mL/hr over 30 Minutes Intravenous Every 24 hours 12/04/23 1939     12/04/23 2030  azithromycin (ZITHROMAX) 500 mg in sodium chloride 0.9 % 250 mL IVPB        500 mg 250 mL/hr over 60 Minutes Intravenous Every 24 hours 12/04/23 1939         Subjective: No new complaints  Objective: Vitals:   12/07/23 0916 12/07/23 0934 12/07/23 1157 12/07/23 1548  BP:  (!) 141/61 112/69  112/69  Pulse:   73   Resp:   14   Temp:   97.7 F (36.5 C)   TempSrc:   Oral   SpO2: 98%  95%     Intake/Output Summary (Last 24 hours) at 12/07/2023 1700 Last data filed at 12/07/2023 4098 Gross per 24 hour  Intake --  Output 300 ml  Net -300 ml   There were no vitals filed for this visit.  Examination:  General: No acute distress. Cardiovascular:  RRR Lungs: diminished, unlabored Abdomen: Soft, nontender, nondistended  Neurological:  R sided weakness, dysarthria  Extremities: No clubbing or cyanosis. No edema.    Data Reviewed: I have personally reviewed following labs and imaging studies  CBC: Recent Labs  Lab 12/04/23 1510 12/05/23 0442 12/06/23 0358 12/07/23 0412  WBC 10.1 9.4 12.1* 10.4  NEUTROABS 7.8*  --  9.7* 8.0*  HGB 10.5* 10.3* 9.4* 9.2*  HCT 34.3* 34.1* 30.8* 29.9*  MCV 94.5 95.3 94.8 92.0  PLT 295 288 292 271    Basic Metabolic Panel: Recent Labs  Lab 12/04/23 1510 12/05/23 0442 12/05/23 1553 12/06/23 0358 12/07/23 0412  NA 147* 148* 145 145 142  K 4.1 4.3 4.1 3.8 4.0  CL 113* 113* 109 111 108  CO2 21* 21* 23 26 24   GLUCOSE 126* 172* 258* 147* 130*  BUN 28* 31* 36* 36* 33*  CREATININE 1.70* 1.69* 1.79* 1.57* 1.37*  CALCIUM 8.3* 8.4* 8.6* 8.5* 8.3*  MG  --   --   --  2.3 2.1  PHOS  --   --   --  2.5 1.9*    GFR: CrCl cannot be calculated (Unknown ideal weight.).  Liver Function Tests: Recent Labs  Lab 12/04/23 1510 12/05/23 0442 12/06/23 0358 12/07/23 0412  AST 17 14* 14* 16  ALT 12 12 11 13   ALKPHOS 46 50 44 41  BILITOT 1.2 0.9 0.7 0.6  PROT 6.4* 6.9 6.4* 6.0*  ALBUMIN 2.6* 2.5* 2.3* 2.2*    CBG: Recent Labs  Lab 12/06/23 1146 12/06/23 1606 12/06/23 2143 12/07/23 0738 12/07/23 1201  GLUCAP 233* 197* 200* 114* 182*     Recent Results (from the past 240 hours)  Resp panel by RT-PCR (RSV, Flu Kyle [name]&B, Covid) Anterior Nasal Swab     Status: Abnormal   Collection Time: 12/04/23  3:10 PM   Specimen: Anterior Nasal Swab  Result Value Ref Range Status   SARS Coronavirus 2 by RT PCR POSITIVE (Kyle [name]) NEGATIVE Final    Comment: (NOTE) SARS-CoV-2 target nucleic acids are DETECTED.  The SARS-CoV-2 RNA is generally detectable in upper respiratory specimens during the acute phase of infection. Positive results are indicative of the presence of the identified virus, but do not rule out  bacterial infection or co-infection with other pathogens not detected by the test. Clinical correlation with patient history and other diagnostic information is necessary to determine patient infection status. The expected result is Negative.  Kyle Sheet for Patients: [email or web address]  Kyle Sheet for Healthcare Providers: [email or web address].[email or web address]  This test is not yet approved or cleared by the [place] FDA and  has been authorized for detection and/or diagnosis of SARS-CoV-2 by FDA under an Emergency Use Authorization (EUA).  This EUA will remain in effect (meaning this test can be used) for the duration of  the COVID-19 declaration under Section 564(b)(1) of the [place], 21 U.S.C. section 360bbb-3(b)(1), unless the authorization is terminated or revoked sooner.     Influenza Thaddaeus Granja by PCR NEGATIVE NEGATIVE Final  Influenza B by PCR NEGATIVE NEGATIVE Final    Comment: (NOTE) The Xpert Xpress SARS-CoV-2/FLU/RSV plus assay is intended as an aid in the diagnosis of influenza from Nasopharyngeal swab specimens and should not be used as Katharina Jehle sole basis for treatment. Nasal washings and aspirates are unacceptable for Xpert Xpress SARS-CoV-2/FLU/RSV testing.  Kyle Sheet for Patients: [email or web address]  Kyle Sheet for Healthcare Providers: [email or web address].[email or web address]  This test is not yet approved or cleared by the [place] FDA and has been authorized for detection and/or diagnosis of SARS-CoV-2 by FDA under an Emergency Use Authorization (EUA). This EUA will remain in effect (meaning this test can be used) for the duration of the COVID-19 declaration under Section 564(b)(1) of the Act, 21 U.S.C. section 360bbb-3(b)(1), unless the authorization is terminated or revoked.     Resp Syncytial Virus by PCR NEGATIVE NEGATIVE Final    Comment: (NOTE) Kyle Sheet for  Patients: [email or web address]  Kyle Sheet for Healthcare Providers: [email or web address].[email or web address]  This test is not yet approved or cleared by the [place] FDA and has been authorized for detection and/or diagnosis of SARS-CoV-2 by FDA under an Emergency Use Authorization (EUA). This EUA will remain in effect (meaning this test can be used) for the duration of the COVID-19 declaration under Section 564(b)(1) of the Act, 21 U.S.C. section 360bbb-3(b)(1), unless the authorization is terminated or revoked.  Performed at [facility], [address] [place], [name] [address]   Blood culture (routine x 2)     Status: Abnormal   Collection Time: 12/04/23  3:19 PM   Specimen: BLOOD  Result Value Ref Range Status   Specimen Description   Final    BLOOD BLOOD LEFT ARM Performed at [facility], [address], [place], [name] [address]    Special Requests   Final    BOTTLES DRAWN AEROBIC ONLY Blood Culture results may not be optimal due to an inadequate volume of blood received in culture bottles Performed at [facility], [address] [place], [name] [address]    Culture  Setup Time   Final    GRAM POSITIVE COCCI AEROBIC BOTTLE ONLY CRITICAL RESULT CALLED TO, READ BACK BY AND VERIFIED WITH: PHARMD M LILLISTON 12/06/2023 @ 0134 BY AB    Culture (Nala Kachel)  Final    STAPHYLOCOCCUS EPIDERMIDIS THE SIGNIFICANCE OF ISOLATING THIS ORGANISM FROM Deatra Mcmahen SINGLE SET OF BLOOD CULTURES WHEN MULTIPLE SETS ARE DRAWN IS UNCERTAIN. PLEASE NOTIFY THE MICROBIOLOGY DEPARTMENT WITHIN ONE WEEK IF SPECIATION AND SENSITIVITIES ARE REQUIRED. Performed at [facility], [address] [place], [name] [address]    Report Status 12/07/2023 FINAL  Final  Blood Culture ID Panel (Reflexed)     Status: Abnormal   Collection Time: 12/04/23  3:19 PM  Result Value Ref Range Status   Enterococcus faecalis NOT DETECTED  NOT DETECTED Final   Enterococcus Faecium NOT DETECTED NOT DETECTED Final   Listeria monocytogenes NOT DETECTED NOT DETECTED Final   Staphylococcus species DETECTED (Zoe Nordin) NOT DETECTED Final    Comment: CRITICAL RESULT CALLED TO, READ BACK BY AND VERIFIED WITH: PHARMD M LILLISTON 12/06/2023 @ 0134 BY AB    Staphylococcus aureus (BCID) NOT DETECTED NOT DETECTED Final   Staphylococcus epidermidis DETECTED (Shaheed Schmuck) NOT DETECTED Final    Comment: CRITICAL RESULT CALLED TO, READ BACK BY AND VERIFIED WITH: PHARMD M LILLISTON 12/06/2023 @ 0134 BY AB    Staphylococcus lugdunensis NOT DETECTED NOT DETECTED Final   Streptococcus species NOT DETECTED NOT  DETECTED Final   Streptococcus agalactiae NOT DETECTED NOT DETECTED Final   Streptococcus pneumoniae NOT DETECTED NOT DETECTED Final   Streptococcus pyogenes NOT DETECTED NOT DETECTED Final   Jacklin Zwick.calcoaceticus-baumannii NOT DETECTED NOT DETECTED Final   Bacteroides fragilis NOT DETECTED NOT DETECTED Final   Enterobacterales NOT DETECTED NOT DETECTED Final   Enterobacter cloacae complex NOT DETECTED NOT DETECTED Final   Escherichia coli NOT DETECTED NOT DETECTED Final   Klebsiella aerogenes NOT DETECTED NOT DETECTED Final   Klebsiella oxytoca NOT DETECTED NOT DETECTED Final   Klebsiella pneumoniae NOT DETECTED NOT DETECTED Final   Proteus species NOT DETECTED NOT DETECTED Final   Salmonella species NOT DETECTED NOT DETECTED Final   Serratia marcescens NOT DETECTED NOT DETECTED Final   Haemophilus influenzae NOT DETECTED NOT DETECTED Final   Neisseria meningitidis NOT DETECTED NOT DETECTED Final   Pseudomonas aeruginosa NOT DETECTED NOT DETECTED Final   Stenotrophomonas maltophilia NOT DETECTED NOT DETECTED Final   Candida albicans NOT DETECTED NOT DETECTED Final   Candida auris NOT DETECTED NOT DETECTED Final   Candida glabrata NOT DETECTED NOT DETECTED Final   Candida krusei NOT DETECTED NOT DETECTED Final   Candida parapsilosis NOT DETECTED NOT  DETECTED Final   Candida tropicalis NOT DETECTED NOT DETECTED Final   Cryptococcus neoformans/gattii NOT DETECTED NOT DETECTED Final   Methicillin resistance mecA/C NOT DETECTED NOT DETECTED Final    Comment: Performed at [facility], [address], [place], [name] [address]  Blood culture (routine x 2)     Status: None (Preliminary result)   Collection Time: 12/04/23 10:06 PM   Specimen: BLOOD  Result Value Ref Range Status   Specimen Description   Final    BLOOD BLOOD LEFT HAND AEROBIC BOTTLE ONLY Performed at [facility], [address] [place], [name] [address]    Special Requests   Final    Blood Culture results may not be optimal due to an inadequate volume of blood received in culture bottles BOTTLES DRAWN AEROBIC ONLY Performed at [facility], [address], [place], [name] [address]    Culture   Final    NO GROWTH 2 DAYS Performed at [facility], [address] [place], [name] [address]    Report Status PENDING  Incomplete  MRSA Next Gen by PCR, Nasal     Status: None   Collection Time: 12/05/23 12:35 AM   Specimen: Nasal Mucosa; Nasal Swab  Result Value Ref Range Status   MRSA by PCR Next Gen NOT DETECTED NOT DETECTED Final    Comment: (NOTE) The GeneXpert MRSA Assay (FDA approved for NASAL specimens only), is one component of [employer] comprehensive MRSA colonization surveillance program. It is not intended to diagnose MRSA infection nor to guide or monitor treatment for MRSA infections. Test performance is not FDA approved in patients less than 24 years old. Performed at [facility], [address], [place], [name] [address]          Radiology Studies: No results found.       Scheduled Meds:  apixaban  5 mg Oral BID   carvedilol  6.25 mg Oral BID WC   cloNIDine  0.2 mg Oral 3 times per day   fluticasone furoate-vilanterol  1 puff Inhalation Daily   And   umeclidinium bromide   1 puff Inhalation Daily   gabapentin  600 mg Oral QHS   insulin aspart  0-15 Units Subcutaneous TID WC  insulin aspart  0-5 Units Subcutaneous QHS   ipratropium-albuterol  3 mL Nebulization BID   levETIRAcetam  500 mg Oral BID   mirtazapine  7.5 mg Oral QHS   pantoprazole  40 mg Oral Daily   predniSONE  40 mg Oral Q breakfast   rosuvastatin  10 mg Oral QHS   Continuous Infusions:  azithromycin 500 mg (12/06/23 2039)   cefTRIAXone (ROCEPHIN)  IV 2 g (12/06/23 2142)     LOS: 3 days    Time spent: over 30 min    Lacretia Nicks, MD [facility]   To contact the attending provider between 7A-7P or the covering provider during after hours 7P-7A, please log into the web site [email or web address] and access using universal [place] password for that web site. If you do not have the password, please call the hospital operator.  12/07/2023, 5:00 PM

## 2023-12-07 NOTE — NC FL2 (Cosign Needed)
[place] MEDICAID FL2 LEVEL OF CARE FORM     IDENTIFICATION  Patient Name: Kyle [name] Birthdate: Feb 28, 1951 Sex: male Admission Date (Current Location): 12/04/2023  [facility] and [place] Number:  Producer, television/film/video and Address:  [facility],  [address] [place], [place] [address]      Provider Number: [id number]  Attending Physician Name and Address:  Zigmund Daniel., *  Relative Name and Phone Number:  Willner,Lois (Spouse)  [phone] (Mobile)    Current Level of Care: Hospital Recommended Level of Care: Skilled Nursing Facility Prior Approval Number:    Date Approved/Denied:   PASRR Number: [id number]  Discharge Plan: SNF    Current Diagnoses: Patient Active Problem List   Diagnosis Date Noted   Acute on chronic respiratory failure with hypoxemia (HCC) 12/04/2023   Acute hypoxic respiratory failure (HCC)  COVID 11/22/2023   Hypernatremia 11/22/2023   Chronic kidney disease, stage 3 unspecified (HCC) 11/22/2023   Type 2 diabetes mellitus (HCC) 11/22/2023   Elevated troponin 11/22/2023   Right foot ulcer (HCC) 11/22/2023   Pressure injury of skin 10/05/2023   Dysphagia 10/05/2023   Hip pain, right 10/05/2023   Pseudoaneurysm (HCC) 09/10/2023   ICH (intracerebral hemorrhage) (HCC) 09/10/2023   Hemorrhagic stroke (HCC) 09/06/2023   Hand pain, right 10/12/2022   Chronic respiratory failure with hypoxia (HCC) 01/29/2022   Atrial flutter (HCC)    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HCC) 02/20/2020   Pulmonary nodules/lesions, multiple 01/29/2020   Anticoagulant long-term use 01/29/2020   Hypoxia    SOB (shortness of breath)    Atrial fibrillation, chronic (HCC) 12/24/2019   Diabetes mellitus type 2, insulin dependent (HCC) 12/24/2019   Healthcare maintenance 07/14/2019   Medication management 07/14/2019   PAD (peripheral artery disease) (HCC) 04/22/2019   Atherosclerosis of artery of extremity with rest pain (HCC) 04/21/2019   Deformity  of metatarsal bone of right foot 04/17/2019   COPD (chronic obstructive pulmonary disease) (HCC) 12/16/2018   Elevated PSA 12/27/2017   Protein-calorie malnutrition, severe (HCC) 12/18/2014   Aortoiliac occlusive disease (HCC) 12/16/2014   PVD (peripheral vascular disease) (HCC) 10/24/2014   Aortic stenosis 09/12/2014   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   Pulmonary hypertension (HCC) 09/12/2014   Dyslipidemia 09/12/2014   Atherosclerosis of native arteries of extremity with intermittent claudication (HCC) 05/11/2012   Aortic insufficiency    Hypertension    Peripheral arterial disease (HCC)    Dilated cardiomyopathy (HCC)    Chronic systolic CHF (congestive heart failure) (HCC)     Orientation RESPIRATION BLADDER Height & Weight     Time, Self, Situation, Place  O2 (5L) Incontinent Weight:   Height:     BEHAVIORAL SYMPTOMS/MOOD NEUROLOGICAL BOWEL NUTRITION STATUS      Incontinent Diet  AMBULATORY STATUS COMMUNICATION OF NEEDS Skin   Limited Assist Verbally PU Stage and Appropriate Care (Buttocks, ankle)   PU Stage 2 Dressing:  (PRN)                   Personal Care Assistance Level of Assistance  Bathing, Feeding, Dressing Bathing Assistance: Limited assistance Feeding assistance: Independent Dressing Assistance: Limited assistance     Functional Limitations Info  Sight, Hearing, Speech Sight Info: Adequate Hearing Info: Adequate Speech Info: Adequate    SPECIAL CARE FACTORS FREQUENCY  PT (By licensed PT), OT (By licensed OT)     PT Frequency: 5 x a week OT Frequency: 5 x a week  Contractures Contractures Info: Not present    Additional Factors Info  Code Status, Allergies Code Status Info: full Allergies Info: NKA           Current Medications (12/07/2023):  This is the current hospital active medication list Current Facility-Administered Medications  Medication Dose Route Frequency Provider Last Rate Last Admin   albuterol  (PROVENTIL) (2.5 MG/3ML) 0.083% nebulizer solution 2.5 mg  2.5 mg Nebulization Q4H PRN Zigmund Daniel., MD       apixaban Everlene Balls) tablet 5 mg  5 mg Oral BID Zigmund Daniel., MD   5 mg at 12/07/23 0934   azithromycin (ZITHROMAX) 500 mg in sodium chloride 0.9 % 250 mL IVPB  500 mg Intravenous Q24H Earlie Lou L, MD 250 mL/hr at 12/06/23 2039 500 mg at 12/06/23 2039   carvedilol (COREG) tablet 6.25 mg  6.25 mg Oral BID WC Zigmund Daniel., MD   6.25 mg at 12/07/23 0933   cefTRIAXone (ROCEPHIN) 2 g in sodium chloride 0.9 % 100 mL IVPB  2 g Intravenous Q24H Earlie Lou L, MD 200 mL/hr at 12/06/23 2142 2 g at 12/06/23 2142   cloNIDine (CATAPRES) tablet 0.2 mg  0.2 mg Oral 3 times per day Zigmund Daniel., MD   0.2 mg at 12/07/23 0934   fluticasone furoate-vilanterol (BREO ELLIPTA) 200-25 MCG/ACT 1 puff  1 puff Inhalation Daily Zigmund Daniel., MD   1 puff at 12/07/23 9562   And   umeclidinium bromide (INCRUSE ELLIPTA) 62.5 MCG/ACT 1 puff  1 puff Inhalation Daily Zigmund Daniel., MD   1 puff at 12/07/23 0919   gabapentin (NEURONTIN) capsule 600 mg  600 mg Oral QHS Zigmund Daniel., MD   600 mg at 12/06/23 2048   insulin aspart (novoLOG) injection 0-15 Units  0-15 Units Subcutaneous TID WC Earlie Lou L, MD   3 Units at 12/07/23 1208   insulin aspart (novoLOG) injection 0-5 Units  0-5 Units Subcutaneous QHS Garba, Mohammad L, MD       ipratropium-albuterol (DUONEB) 0.5-2.5 (3) MG/3ML nebulizer solution 3 mL  3 mL Nebulization BID Zigmund Daniel., MD   3 mL at 12/07/23 0917   levETIRAcetam (KEPPRA) tablet 500 mg  500 mg Oral BID Zigmund Daniel., MD   500 mg at 12/07/23 1308   mirtazapine (REMERON) tablet 7.5 mg  7.5 mg Oral QHS Zigmund Daniel., MD   7.5 mg at 12/06/23 2048   ondansetron (ZOFRAN) tablet 4 mg  4 mg Oral Q6H PRN Rometta Emery, MD       Or   ondansetron (ZOFRAN) injection 4 mg  4 mg Intravenous Q6H PRN Rometta Emery, MD       pantoprazole (PROTONIX) EC tablet 40 mg  40 mg Oral Daily Zigmund Daniel., MD   40 mg at 12/07/23 6578   predniSONE (DELTASONE) tablet 40 mg  40 mg Oral Q breakfast Zigmund Daniel., MD   40 mg at 12/07/23 0933   rosuvastatin (CRESTOR) tablet 10 mg  10 mg Oral QHS Zigmund Daniel., MD   10 mg at 12/06/23 2047     Discharge Medications: Please see discharge summary for a list of discharge medications.  Relevant Imaging Results:  Relevant Lab Results:   Additional Information SSN:[id number]  Valentina Shaggy Tisheena Maguire, LCSW

## 2023-12-07 NOTE — Evaluation (Signed)
Physical Therapy Evaluation Patient Details Name: Kyle [name] MRN: [id number] DOB: [birth year] Today's Date: 12/07/2023  History of Present Illness  Patient is a 73 year old male who was admitted from SNF with acute on chronic respiratory failure. 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  Clinical Impression  On eval, pt required Max A +2 for mobility. He was able to sit EOB with varying level of assistance depending on task-static sitting vs attempting to reposition/laterally scoot. Pt remained on  O2 during session-tolerated activity well. Assisted pt back to bed with lunch setup assistance. Patient will benefit from continued inpatient follow up therapy, <3 hours/day         If plan is discharge home, recommend the following: Two people to help with walking and/or transfers;A lot of help with bathing/dressing/bathroom;Assistance with feeding;Assist for transportation   Can travel by private vehicle   No    Equipment Recommendations None recommended by PT  Recommendations for Other Services       Functional Status Assessment Patient has had a recent decline in their functional status and demonstrates the ability to make significant improvements in function in a reasonable and predictable amount of time.     Precautions / Restrictions Precautions Precautions: Fall Precaution/Restrictions Comments: R hemiplegia Restrictions Weight Bearing Restrictions Per Provider Order: No Other Position/Activity Restrictions: has AFOs and hand splints at SNF      Mobility  Bed Mobility Overal bed mobility: Needs Assistance Bed Mobility: Supine to Sit, Sit to Supine     Supine to sit: +2 for physical assistance, +2 for safety/equipment, Max assist, HOB elevated, Used rails Sit to supine: Max assist, +2 for physical assistance, +2 for safety/equipment, HOB elevated   General bed mobility  comments: When asked how he gets to EOB at SNF, pt stated "they help me"-pt could not verbalize his usual technique. Assist for trunk and bil LEs. Utilized bedpad to assist with positioning at EOB and back to supine.    Transfers                   General transfer comment: Attempted lateral scooting along EOB. Pt unable. LOB x 1 posteriorly-required assist to recover    Ambulation/Gait                  Stairs            Wheelchair Mobility     Tilt Bed    Modified Rankin (Stroke Patients Only)       Balance Overall balance assessment: Needs assistance Sitting-balance support: Feet supported, Single extremity supported   Sitting balance - Comments: Fair static sitting balance. Poor dynamic sitting balance                                     Pertinent Vitals/Pain Pain Assessment Pain Assessment: Faces Faces Pain Scale: Hurts little more Pain Location: "all over" Pain Descriptors / Indicators: Discomfort Pain Intervention(s): Limited activity within patient's tolerance, Monitored during session, Repositioned    Home Living Family/patient expects to be discharged to:: Skilled nursing facility                   Additional Comments: Uses 4.5L at baseline    Prior Function Prior Level of Function : Needs assist       Physical Assist : Mobility (  physical);ADLs (physical) Mobility (physical): Bed mobility;Transfers;Gait ADLs (physical): Grooming;Bathing;Dressing;Toileting;IADLs Mobility Comments: went from CIR to SNF. pt reports working with therapy at SNF ADLs Comments: requires assist     Extremity/Trunk Assessment   Upper Extremity Assessment Upper Extremity Assessment: Defer to OT evaluation RUE Deficits / Details: R flaccid hemi, some shoulder functioning  (shrugging) still intact. Sensatin intact per pt report. Skin breakdown on R palm. sensative to tocuh.    Lower Extremity Assessment Lower Extremity Assessment:  RLE deficits/detail RLE Deficits / Details: R hemiplegia-flaccid    Cervical / Trunk Assessment Cervical / Trunk Assessment: Normal  Communication   Communication Factors Affecting Communication: Reduced clarity of speech    Cognition Arousal: Alert Behavior During Therapy: WFL for tasks assessed/performed   PT - Cognitive impairments: Problem solving, Safety/Judgement                         Following commands: Impaired       Cueing Cueing Techniques: Verbal cues     General Comments      Exercises     Assessment/Plan    PT Assessment Patient needs continued PT services  PT Problem List Decreased strength;Decreased activity tolerance;Decreased balance;Decreased mobility;Decreased skin integrity;Cardiopulmonary status limiting activity;Decreased safety awareness;Decreased coordination;Impaired tone       PT Treatment Interventions DME instruction;Functional mobility training;Therapeutic activities;Therapeutic exercise;Balance training;Patient/family education;Wheelchair mobility training    PT Goals (Current goals can be found in the Care Plan section)  Acute Rehab PT Goals Patient Stated Goal: to return to rehab PT Goal Formulation: With patient Time For Goal Achievement: 12/21/23 Potential to Achieve Goals: Good    Frequency Min 1X/week     Co-evaluation               AM-PAC PT "6 Clicks" Mobility  Outcome Measure Help needed turning from your back to your side while in a flat bed without using bedrails?: A Lot Help needed moving from lying on your back to sitting on the side of a flat bed without using bedrails?: A Lot Help needed moving to and from a bed to a chair (including a wheelchair)?: Total Help needed standing up from a chair using your arms (e.g., wheelchair or bedside chair)?: Total Help needed to walk in hospital room?: Total Help needed climbing 3-5 steps with a railing? : Total 6 Click Score: 8    End of Session Equipment  Utilized During Treatment: Oxygen Activity Tolerance: Patient tolerated treatment well Patient left: in bed;with call bell/phone within reach;with bed alarm set   PT Visit Diagnosis: Hemiplegia and hemiparesis;Other symptoms and signs involving the nervous system (R29.898) Hemiplegia - Right/Left: Right Hemiplegia - dominant/non-dominant: Dominant Hemiplegia - caused by: Nontraumatic intracerebral hemorrhage    Time: 8469-6295 PT Time Calculation (min) (ACUTE ONLY): 19 min   Charges:   PT Evaluation $PT Eval Low Complexity: 1 Low   PT General Charges $$ ACUTE PT VISIT: 1 Visit           Faye Ramsay, PT Acute Rehabilitation  Office: [phone]

## 2023-12-08 ENCOUNTER — Encounter (HOSPITAL_COMMUNITY): Payer: Self-pay | Admitting: Internal Medicine

## 2023-12-08 DIAGNOSIS — R531 Weakness: Secondary | ICD-10-CM | POA: Diagnosis not present

## 2023-12-08 DIAGNOSIS — K5900 Constipation, unspecified: Secondary | ICD-10-CM | POA: Diagnosis not present

## 2023-12-08 DIAGNOSIS — I619 Nontraumatic intracerebral hemorrhage, unspecified: Secondary | ICD-10-CM | POA: Diagnosis not present

## 2023-12-08 DIAGNOSIS — J9621 Acute and chronic respiratory failure with hypoxia: Secondary | ICD-10-CM | POA: Diagnosis not present

## 2023-12-08 DIAGNOSIS — I272 Pulmonary hypertension, unspecified: Secondary | ICD-10-CM | POA: Diagnosis not present

## 2023-12-08 DIAGNOSIS — I5022 Chronic systolic (congestive) heart failure: Secondary | ICD-10-CM | POA: Diagnosis not present

## 2023-12-08 DIAGNOSIS — G40901 Epilepsy, unspecified, not intractable, with status epilepticus: Secondary | ICD-10-CM | POA: Diagnosis not present

## 2023-12-08 DIAGNOSIS — E870 Hyperosmolality and hypernatremia: Secondary | ICD-10-CM | POA: Diagnosis not present

## 2023-12-08 DIAGNOSIS — R262 Difficulty in walking, not elsewhere classified: Secondary | ICD-10-CM | POA: Diagnosis not present

## 2023-12-08 DIAGNOSIS — R41841 Cognitive communication deficit: Secondary | ICD-10-CM | POA: Diagnosis not present

## 2023-12-08 DIAGNOSIS — Z7401 Bed confinement status: Secondary | ICD-10-CM | POA: Diagnosis not present

## 2023-12-08 DIAGNOSIS — Z7901 Long term (current) use of anticoagulants: Secondary | ICD-10-CM

## 2023-12-08 DIAGNOSIS — J440 Chronic obstructive pulmonary disease with acute lower respiratory infection: Secondary | ICD-10-CM | POA: Diagnosis not present

## 2023-12-08 DIAGNOSIS — E785 Hyperlipidemia, unspecified: Secondary | ICD-10-CM | POA: Diagnosis not present

## 2023-12-08 DIAGNOSIS — E1122 Type 2 diabetes mellitus with diabetic chronic kidney disease: Secondary | ICD-10-CM | POA: Diagnosis not present

## 2023-12-08 DIAGNOSIS — N1831 Chronic kidney disease, stage 3a: Secondary | ICD-10-CM

## 2023-12-08 DIAGNOSIS — R059 Cough, unspecified: Secondary | ICD-10-CM | POA: Diagnosis not present

## 2023-12-08 DIAGNOSIS — I482 Chronic atrial fibrillation, unspecified: Secondary | ICD-10-CM | POA: Diagnosis not present

## 2023-12-08 DIAGNOSIS — L97911 Non-pressure chronic ulcer of unspecified part of right lower leg limited to breakdown of skin: Secondary | ICD-10-CM | POA: Diagnosis not present

## 2023-12-08 DIAGNOSIS — J9611 Chronic respiratory failure with hypoxia: Secondary | ICD-10-CM | POA: Diagnosis not present

## 2023-12-08 DIAGNOSIS — L97921 Non-pressure chronic ulcer of unspecified part of left lower leg limited to breakdown of skin: Secondary | ICD-10-CM | POA: Diagnosis not present

## 2023-12-08 DIAGNOSIS — L97318 Non-pressure chronic ulcer of right ankle with other specified severity: Secondary | ICD-10-CM | POA: Diagnosis not present

## 2023-12-08 DIAGNOSIS — F32A Depression, unspecified: Secondary | ICD-10-CM | POA: Diagnosis not present

## 2023-12-08 DIAGNOSIS — R0902 Hypoxemia: Secondary | ICD-10-CM | POA: Diagnosis not present

## 2023-12-08 DIAGNOSIS — I5021 Acute systolic (congestive) heart failure: Secondary | ICD-10-CM | POA: Diagnosis not present

## 2023-12-08 DIAGNOSIS — L97418 Non-pressure chronic ulcer of right heel and midfoot with other specified severity: Secondary | ICD-10-CM | POA: Diagnosis not present

## 2023-12-08 DIAGNOSIS — M6281 Muscle weakness (generalized): Secondary | ICD-10-CM | POA: Diagnosis not present

## 2023-12-08 DIAGNOSIS — J449 Chronic obstructive pulmonary disease, unspecified: Secondary | ICD-10-CM | POA: Diagnosis not present

## 2023-12-08 DIAGNOSIS — I10 Essential (primary) hypertension: Secondary | ICD-10-CM | POA: Diagnosis not present

## 2023-12-08 DIAGNOSIS — M25551 Pain in right hip: Secondary | ICD-10-CM | POA: Diagnosis not present

## 2023-12-08 LAB — GLUCOSE, CAPILLARY
Glucose-Capillary: 129 mg/dL — ABNORMAL HIGH (ref 70–99)
Glucose-Capillary: 220 mg/dL — ABNORMAL HIGH (ref 70–99)

## 2023-12-08 MED ORDER — ALBUTEROL SULFATE (2.5 MG/3ML) 0.083% IN NEBU: 2.5000 mg | INHALATION_SOLUTION | RESPIRATORY_TRACT | Status: AC | PRN

## 2023-12-08 MED ORDER — PREDNISONE 20 MG PO TABS: ORAL_TABLET | ORAL | 0 refills | Status: AC

## 2023-12-08 MED ORDER — SODIUM CHLORIDE 0.9 % IV SOLN: 500.0000 mg | INTRAVENOUS | Status: DC

## 2023-12-08 MED ORDER — IPRATROPIUM-ALBUTEROL 0.5-2.5 (3) MG/3ML IN SOLN: 3.0000 mL | Freq: Three times a day (TID) | RESPIRATORY_TRACT | Status: DC

## 2023-12-08 MED ORDER — AZITHROMYCIN 250 MG PO TABS
500.0000 mg | ORAL_TABLET | Freq: Every day | ORAL | Status: DC
  Administered 2023-12-08: 500 mg via ORAL
  Filled 2023-12-08: qty 2

## 2023-12-08 MED ORDER — CEPHALEXIN 500 MG PO CAPS: 500.0000 mg | ORAL_CAPSULE | Freq: Two times a day (BID) | ORAL | Status: AC

## 2023-12-08 NOTE — Consult Note (Signed)
[facility] Liaison Consult Note   12/08/2023  BAKARY BRAMER 1951/08/14 [id number]  Insurance: [employer]   Primary Care Provider: Levin Erp, MD with [facility] Medicine, this provider is listed for the transition of care follow up appointments  and [facility] calls   [facility] Liaison screened the patient remotely at [facility].    The patient was screened for 30 day readmission hospitalization with noted rising high risk score for unplanned readmission risk 4 hospital admissions in 6 months.  The patient was assessed for potential Community Care Coordination service needs for post hospital transition for care coordination. Review of patient's electronic medical record reveals patient is transitioning back to [place] for ST rehab.   Plan: Patient post hospital needs are to be met at a skilled nursing facility level of care.  No community VBCI follow up at SNF at this time.   VBCI Community Care, Population Health does not replace or interfere with any arrangements made by the Inpatient Transition of Care team.   For questions contact:   Charlesetta Shanks, RN, BSN, CCM [place]  [facility], [facility] Liaison Direct Dial: [phone] or secure chat Email: [email or web address]

## 2023-12-08 NOTE — Discharge Summary (Addendum)
 [facility] Discharge Summary   Patient name: Kyle [name]  Admit date:     12/04/2023  Discharge date: 12/08/2023  Attending Physician: Rometta Emery [[id number]]  Discharge Physician: Carollee Herter   PCP: Levin Erp, MD  Admitted From: [facility] Dis[facility]:   [facility]  Recommendations for Outpatient Follow-up:  Follow up with [facility] Teaching service PCP in 1-2 weeks  Home Health:No Equipment/Devices: Oxygen 4-5 L/min  Discharge Condition:Stable CODE STATUS:FULL Diet recommendation: Diabetic Fluid Restriction: None  Hospital Summary: HPI: Kyle [name] is a 73 y.o. 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  Patient was admitted to the hospital where he was treated and subsequently discharged to [facility] nursing home.  He was brought back again today with significant shortness of breath and hypoxia.  Patient is currently on 9 L of oxygen per minute but was brought in on oxygen by none rebreather bag.  Patient suspected to have had ongoing hypoxic respiratory failure triggered by his COVID-19 infection.  Repeat testing showed no evidence of infiltrates or CHF however patient is hypoxic.  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  He has been admitted with acute on chronic hypoxic respiratory failure.   Significant Events: Admitted 12/04/2023 acute on chronic respiratory failure with hypoxia   Significant Labs: Covid-19 POSITIVE WBC 10.1, HgB  10.5, plt 295 Na 147, K 4.1, CO2 of 21, BUN 28, Scr 1.7, glu 126 BNP 344  Significant Imaging Studies: CXR  Interval linear atelectasis at the left lung base.2. Mild cardiomegaly. CT chest  Severe emphysema. 2. Trace bilateral pleural effusions. 3. Bibasilar scarring or atelectasis. 4. Cardiomegaly and coronary artery disease. 5. Dense aortic valve calcifications. Correlate for echocardiographic evidence of aortic valve dysfunction  Antibiotic Therapy: Anti-infectives (From admission, onward)    Start     Dose/Rate Route Frequency Ordered Stop   12/04/23 2030  cefTRIAXone (ROCEPHIN) 2 g in sodium chloride 0.9 % 100 mL IVPB        2 g 200 mL/hr over 30 Minutes Intravenous Every 24 hours 12/04/23 1939     12/04/23 2030  azithromycin (ZITHROMAX) 500 mg in sodium chloride 0.9 % 250 mL IVPB        500 mg 250 mL/hr over 60 Minutes Intravenous Every 24 hours 12/04/23 1939         Procedures:   Consultants:    Hospital Course by Problem: * Acute on chronic respiratory failure with hypoxemia (HCC) From admission to 12-07-2023. Discharged on 4.5 L from last hospitalization (brought into ED on NRB, on 9 L [place] at time of H&P) He's currently on 5 L, will work on weaning to his baseline.   CXR with linear atelectasis at L lung base. CT chest with severe emphysema, trace effusions . Continue abx while we treat as COPD exacerbation. Continue steroids for now. Notably recently treated for covid, diagnosed 1/27 - CT without evidence of pneumonia - will d/c isolation as he's beyond 10 days. Recent COVID 19 infection. D/c isolation  12-08-2023 stable on 4-5 L/min O2. Back to baseline.  COPD (chronic obstructive pulmonary disease) (  HCC) From admission to 12-07-2023. Acute COPD exacerbation.  Continue abx while we treat as COPD exacerbation. Continue steroids for now. Notably recently treated for covid, diagnosed 1/27   12-08-2023 DC to SNF with 12 day prednisone taper starting at 40 mg daily. Taper by  10 mg every 3 days. Continue duonebs tid scheduled. Continue with routine inhalers.  Hypernatremia Treated with IVF. Resolved. Hx of poor po liquid intake. Therefore not on scheduled or PRN diuretics.  Pressure injury of skin Present on admission.  Pressure Injury 12/04/23 Ankle Anterior;Right Unstageable - Full thickness tissue loss in which the base of the injury is covered by slough (yellow, tan, gray, green or brown) and/or eschar (tan, brown or black) in the wound bed.   Pressure Injury 12/04/23 Buttocks Right;Lateral Stage 2 -  Partial thickness loss of dermis presenting as a shallow open injury with a red, pink wound bed without slough.   Pressure Injury 12/04/23 Buttocks Right;Medial;Posterior Stage 2 -  Partial thickness loss of dermis presenting as a shallow open injury with a red, pink wound bed without slough.         CKD stage 3a, GFR 45-59 ml/min (HCC) - Baseline Scr 1.6-2.0 Chronic. Baseline Scr 1.6-2.0  Anticoagulant long-term use On eliquis for hx of chronic afib.  Diabetes mellitus type 2, insulin dependent (HCC) On SSI and tresiba.  Atrial fibrillation, chronic (HCC) Chronic on coreg and eliquis.  Pulmonary hypertension (HCC) Chronic. Continue with coreg and supplemental O2.  Chronic systolic CHF (congestive heart failure) (HCC) - prior echo 10-2018 showed LVEF 40%. Now recovered LVEF 55-60%(echo 08-2023) Stable. Not on scheduled or prn diuretics due to consistently poor po liquid intake. Remains on coreg 6.25 mg bid, hdyralazine and imdure. Not on ARB/ACEI due to CKD stage 3a.  Peripheral arterial disease (HCC) Chronic.  Hypertension Resume home HTN meds. Stop norvasc as it was held during last hospital admission.  Aortic insufficiency Chronic.    Discharge Diagnoses:  Principal Problem:   Acute on chronic respiratory failure with hypoxemia (HCC) Active Problems:   COPD (chronic obstructive pulmonary disease) (HCC)   Aortic insufficiency    Hypertension   Peripheral arterial disease (HCC)   Chronic systolic CHF (congestive heart failure) (HCC)   Pulmonary hypertension (HCC)   Atrial fibrillation, chronic (HCC)   Diabetes mellitus type 2, insulin dependent (HCC)   Anticoagulant long-term use   CKD stage 3a, GFR 45-59 ml/min (HCC) - Baseline Scr 1.6-2.0   Pressure injury of skin   Hypernatremia   Discharge Instructions  Discharge Instructions     Call MD for:  difficulty breathing, headache or visual disturbances   Complete by: As directed    Call MD for:  extreme fatigue   Complete by: As directed    Call MD for:  hives   Complete by: As directed    Call MD for:  persistant dizziness or light-headedness   Complete by: As directed    Call MD for:  persistant nausea and vomiting   Complete by: As directed    Call MD for:  redness, tenderness, or signs of infection (pain, swelling, redness, odor or green/yellow discharge around incision site)   Complete by: As directed    Call MD for:  severe uncontrolled pain   Complete by: As directed    Call MD for:  temperature >100.4   Complete by: As directed    Diet - low sodium heart healthy   Complete by: As directed    Diet Carb Modified  Complete by: As directed    Discharge instructions   Complete by: As directed    1. Follow up with your nursing home provider after discharge from hospital. 2. You need to wear 4-5 L/min of oxygen at all times. 3. Follow up with the [facility] practice teaching service in 2-4 weeks.   Discharge wound care:   Complete by: As directed    1. Duoderm to right buttocks wound 2. To right ankle, wash in warm water and soap once a daily. Pat dry. Do NOT rub dry.  Cover in Xeroform(vaseline gauze) to right ankle wound then wrapped in kerlex. Change daily.   Increase activity slowly   Complete by: As directed       Allergies as of 12/08/2023   No Known Allergies      Medication List     STOP taking these medications     amLODipine 10 MG tablet Commonly known as: NORVASC   triamcinolone cream 0.1 % Commonly known as: KENALOG       TAKE these medications    acetaminophen 325 MG tablet Commonly known as: TYLENOL Take 2 tablets (650 mg total) by mouth every 4 (four) hours as needed for mild pain (pain score 1-3).   albuterol (2.5 MG/3ML) 0.083% nebulizer solution Commonly known as: PROVENTIL Take 3 mLs (2.5 mg total) by nebulization every 4 (four) hours as needed for wheezing or shortness of breath.   bisacodyl 10 MG suppository Commonly known as: DULCOLAX Place 10 mg rectally daily as needed (for constipation).   bisacodyl 5 MG EC tablet Commonly known as: DULCOLAX Take 1 tablet (5 mg total) by mouth daily.   carvedilol 6.25 MG tablet Commonly known as: COREG TAKE 1 TABLET TWICE DAILY WITH MEALS (NEED MD APPOINTMENT) What changed: See the new instructions.   cephALEXin 500 MG capsule Commonly known as: KEFLEX Take 1 capsule (500 mg total) by mouth in the morning and at bedtime for 6 days.   cloNIDine 0.2 MG tablet Commonly known as: CATAPRES Take 1 tablet (0.2 mg total) by mouth every 8 (eight) hours. What changed:  when to take this additional instructions   diclofenac Sodium 1 % Gel Commonly known as: VOLTAREN Apply 2 g topically 4 (four) times daily. What changed:  when to take this additional instructions   Eliquis 5 MG Tabs tablet Generic drug: apixaban TAKE 1 TABLET TWICE DAILY What changed: when to take this   gabapentin 600 MG tablet Commonly known as: NEURONTIN Take 600 mg by mouth at bedtime.   guaifenesin 100 MG/5ML syrup Commonly known as: ROBITUSSIN Take 200 mg by mouth in the morning and at bedtime.   hydrALAZINE 100 MG tablet Commonly known as: APRESOLINE Take 1 tablet (100 mg total) by mouth 3 (three) times daily.   insulin aspart 100 UNIT/ML injection Commonly known as: novoLOG Inject 0-10 Units into the skin See admin instructions. Inject 0-10  units into the skin three times a day before meals and at bedtime, per Sliding Scale: BGL 0-59 OR 400-1,000 = CALL MD; 60-150 = give nothing; 151-199 = 2 units; 200-249 = 4 units; 250-299 = 6 units; 300-349 = 8 units; 350-399 = 10 units   insulin degludec 100 UNIT/ML FlexTouch Pen Commonly known as: TRESIBA Inject 5 Units into the skin in the morning.   ipratropium-albuterol 0.5-2.5 (3) MG/3ML Soln Commonly known as: DUONEB Take 3 mLs by nebulization in the morning, at noon, and at bedtime.   isosorbide mononitrate 30 MG 24 hr  tablet Commonly known as: IMDUR TAKE 1 TABLET BY MOUTH ONCE DAILY.   levETIRAcetam 500 MG tablet Commonly known as: KEPPRA Take 1 tablet (500 mg total) by mouth 2 (two) times daily.   mirtazapine 7.5 MG tablet Commonly known as: REMERON Take 1 tablet (7.5 mg total) by mouth at bedtime.   multivitamin tablet Take 1 tablet by mouth daily with breakfast.   Nutritional Supplement Liqd Take 120 mLs by mouth 2 (two) times daily.   OXYGEN Inhale 4 L/min into the lungs continuous.   pantoprazole 40 MG tablet Commonly known as: PROTONIX Take 1 tablet (40 mg total) by mouth daily. What changed: when to take this   polyethylene glycol 17 g packet Commonly known as: MIRALAX / GLYCOLAX Take 17 g by mouth 2 (two) times daily.   predniSONE 20 MG tablet Commonly known as: DELTASONE Take 2 tablets (40 mg total) by mouth daily with breakfast for 3 days, THEN 1.5 tablets (30 mg total) daily with breakfast for 3 days, THEN 1 tablet (20 mg total) daily with breakfast for 3 days, THEN 0.5 tablets (10 mg total) daily with breakfast for 3 days. Start taking on: December 08, 2023   rosuvastatin 10 MG tablet Commonly known as: CRESTOR TAKE 1 TABLET AT BEDTIME   senna-docusate 8.6-50 MG tablet Commonly known as: Senokot-S Take 3 tablets by mouth 2 (two) times daily.   Trelegy Ellipta 200-62.5-25 MCG/ACT Aepb Generic drug: Fluticasone-Umeclidin-Vilant INHALE 1 PUFF  EVERY DAY What changed: See the new instructions.               Discharge Care Instructions  (From admission, onward)           Start     Ordered   12/08/23 0000  Discharge wound care:       Comments: 1. Duoderm to right buttocks wound 2. To right ankle, wash in warm water and soap once a daily. Pat dry. Do NOT rub dry.  Cover in Xeroform(vaseline gauze) to right ankle wound then wrapped in kerlex. Change daily.   12/08/23 0936            No Known Allergies  Discharge Exam: Vitals:   12/08/23 0328 12/08/23 0803  BP: (!) 178/63   Pulse: (!) 47   Resp: 14   Temp: 97.7 F (36.5 C)   SpO2: 97% 100%    Physical Exam Vitals and nursing note reviewed.  Constitutional:      General: He is not in acute distress.    Appearance: He is not toxic-appearing.     Comments: Chronically ill appearing  HENT:     Head: Normocephalic and atraumatic.  Eyes:     General: No scleral icterus. Cardiovascular:     Rate and Rhythm: Normal rate and regular rhythm.  Pulmonary:     Effort: Pulmonary effort is normal. No respiratory distress.     Breath sounds: No wheezing.  Abdominal:     General: Abdomen is flat. Bowel sounds are normal.     Palpations: Abdomen is soft.  Musculoskeletal:     Right lower leg: No edema.     Left lower leg: No edema.  Skin:    General: Skin is warm and dry.     Capillary Refill: Capillary refill takes less than 2 seconds.     The results of significant diagnostics from this hospitalization (including imaging, microbiology, ancillary and laboratory) are listed below for reference.    Microbiology: Recent Results (from the past 240 hours)  Resp panel by RT-PCR (RSV, Flu A&B, Covid) Anterior Nasal Swab     Status: Abnormal   Collection Time: 12/04/23  3:10 PM   Specimen: Anterior Nasal Swab  Result Value Ref Range Status   SARS Coronavirus 2 by RT PCR POSITIVE (A) NEGATIVE Final    Comment: (NOTE) SARS-CoV-2 target nucleic acids are  DETECTED.  The SARS-CoV-2 RNA is generally detectable in upper respiratory specimens during the acute phase of infection. Positive results are indicative of the presence of the identified virus, but do not rule out bacterial infection or co-infection with other pathogens not detected by the test. Clinical correlation with patient history and other diagnostic information is necessary to determine patient infection status. The expected result is Negative.  Fact Sheet for Patients: BloggerCourse.com  Fact Sheet for Healthcare Providers: SeriousBroker.it  This test is not yet approved or cleared by the [place] FDA and  has been authorized for detection and/or diagnosis of SARS-CoV-2 by FDA under an Emergency Use Authorization (EUA).  This EUA will remain in effect (meaning this test can be used) for the duration of  the COVID-19 declaration under Section 564(b)(1) of the A ct, 21 U.S.C. section 360bbb-3(b)(1), unless the authorization is terminated or revoked sooner.     Influenza A by PCR NEGATIVE NEGATIVE Final   Influenza B by PCR NEGATIVE NEGATIVE Final    Comment: (NOTE) The Xpert Xpress SARS-CoV-2/FLU/RSV plus assay is intended as an aid in the diagnosis of influenza from Nasopharyngeal swab specimens and should not be used as a sole basis for treatment. Nasal washings and aspirates are unacceptable for Xpert Xpress SARS-CoV-2/FLU/RSV testing.  Fact Sheet for Patients: BloggerCourse.com  Fact Sheet for Healthcare Providers: SeriousBroker.it  This test is not yet approved or cleared by the [place] FDA and has been authorized for detection and/or diagnosis of SARS-CoV-2 by FDA under an Emergency Use Authorization (EUA). This EUA will remain in effect (meaning this test can be used) for the duration of the COVID-19 declaration under Section 564(b)(1) of the Act, 21  U.S.C. section 360bbb-3(b)(1), unless the authorization is terminated or revoked.     Resp Syncytial Virus by PCR NEGATIVE NEGATIVE Final    Comment: (NOTE) Fact Sheet for Patients: BloggerCourse.com  Fact Sheet for Healthcare Providers: SeriousBroker.it  This test is not yet approved or cleared by the [place] FDA and has been authorized for detection and/or diagnosis of SARS-CoV-2 by FDA under an Emergency Use Authorization (EUA). This EUA will remain in effect (meaning this test can be used) for the duration of the COVID-19 declaration under Section 564(b)(1) of the Act, 21 U.S.C. section 360bbb-3(b)(1), unless the authorization is terminated or revoked.  Performed at [facility], [address], [place], [name] [address]   Blood culture (routine x 2)     Status: Abnormal   Collection Time: 12/04/23  3:19 PM   Specimen: BLOOD  Result Value Ref Range Status   Specimen Description   Final    BLOOD BLOOD LEFT ARM Performed at [facility], [address] [place], [name] [address]    Special Requests   Final    BOTTLES DRAWN AEROBIC ONLY Blood Culture results may not be optimal due to an inadequate volume of blood received in culture bottles Performed at [facility], [address], [place], [name] [address]    Culture  Setup Time   Final    GRAM POSITIVE COCCI AEROBIC BOTTLE ONLY CRITICAL RESULT CALLED TO,  READ BACK BY AND VERIFIED WITH: PHARMD M LILLISTON 12/06/2023 @ 0134 BY AB    Culture (A)  Final    STAPHYLOCOCCUS EPIDERMIDIS THE SIGNIFICANCE OF ISOLATING THIS ORGANISM FROM A SINGLE SET OF BLOOD CULTURES WHEN MULTIPLE SETS ARE DRAWN IS UNCERTAIN. PLEASE NOTIFY THE MICROBIOLOGY DEPARTMENT WITHIN ONE WEEK IF SPECIATION AND SENSITIVITIES ARE REQUIRED. Performed at [facility], [address], [place], [name] [address]    Report Status 12/07/2023  FINAL  Final  Blood Culture ID Panel (Reflexed)     Status: Abnormal   Collection Time: 12/04/23  3:19 PM  Result Value Ref Range Status   Enterococcus faecalis NOT DETECTED NOT DETECTED Final   Enterococcus Faecium NOT DETECTED NOT DETECTED Final   Listeria monocytogenes NOT DETECTED NOT DETECTED Final   Staphylococcus species DETECTED (A) NOT DETECTED Final    Comment: CRITICAL RESULT CALLED TO, READ BACK BY AND VERIFIED WITH: PHARMD M LILLISTON 12/06/2023 @ 0134 BY AB    Staphylococcus aureus (BCID) NOT DETECTED NOT DETECTED Final   Staphylococcus epidermidis DETECTED (A) NOT DETECTED Final    Comment: CRITICAL RESULT CALLED TO, READ BACK BY AND VERIFIED WITH: PHARMD M LILLISTON 12/06/2023 @ 0134 BY AB    Staphylococcus lugdunensis NOT DETECTED NOT DETECTED Final   Streptococcus species NOT DETECTED NOT DETECTED Final   Streptococcus agalactiae NOT DETECTED NOT DETECTED Final   Streptococcus pneumoniae NOT DETECTED NOT DETECTED Final   Streptococcus pyogenes NOT DETECTED NOT DETECTED Final   A.calcoaceticus-baumannii NOT DETECTED NOT DETECTED Final   Bacteroides fragilis NOT DETECTED NOT DETECTED Final   Enterobacterales NOT DETECTED NOT DETECTED Final   Enterobacter cloacae complex NOT DETECTED NOT DETECTED Final   Escherichia coli NOT DETECTED NOT DETECTED Final   Klebsiella aerogenes NOT DETECTED NOT DETECTED Final   Klebsiella oxytoca NOT DETECTED NOT DETECTED Final   Klebsiella pneumoniae NOT DETECTED NOT DETECTED Final   Proteus species NOT DETECTED NOT DETECTED Final   Salmonella species NOT DETECTED NOT DETECTED Final   Serratia marcescens NOT DETECTED NOT DETECTED Final   Haemophilus influenzae NOT DETECTED NOT DETECTED Final   Neisseria meningitidis NOT DETECTED NOT DETECTED Final   Pseudomonas aeruginosa NOT DETECTED NOT DETECTED Final   Stenotrophomonas maltophilia NOT DETECTED NOT DETECTED Final   Candida albicans NOT DETECTED NOT DETECTED Final   Candida auris NOT  DETECTED NOT DETECTED Final   Candida glabrata NOT DETECTED NOT DETECTED Final   Candida krusei NOT DETECTED NOT DETECTED Final   Candida parapsilosis NOT DETECTED NOT DETECTED Final   Candida tropicalis NOT DETECTED NOT DETECTED Final   Cryptococcus neoformans/gattii NOT DETECTED NOT DETECTED Final   Methicillin resistance mecA/C NOT DETECTED NOT DETECTED Final    Comment: Performed at [facility], [address], [place], [name] [address]  Blood culture (routine x 2)     Status: None (Preliminary result)   Collection Time: 12/04/23 10:06 PM   Specimen: BLOOD  Result Value Ref Range Status   Specimen Description   Final    BLOOD BLOOD LEFT HAND AEROBIC BOTTLE ONLY Performed at [facility], [address], [place], [name] [address]    Special Requests   Final    Blood Culture results may not be optimal due to an inadequate volume of blood received in culture bottles BOTTLES DRAWN AEROBIC ONLY Performed at [facility], [address], [place], [name] [address]    Culture   Final    NO GROWTH 3  DAYS Performed at [facility], [address] [place], [name] [address]    Report Status PENDING  Incomplete  MRSA Next Gen by PCR, Nasal     Status: None   Collection Time: 12/05/23 12:35 AM   Specimen: Nasal Mucosa; Nasal Swab  Result Value Ref Range Status   MRSA by PCR Next Gen NOT DETECTED NOT DETECTED Final    Comment: (NOTE) The GeneXpert MRSA Assay (FDA approved for NASAL specimens only), is one component of a comprehensive MRSA colonization surveillance program. It is not intended to diagnose MRSA infection nor to guide or monitor treatment for MRSA infections. Test performance is not FDA approved in patients less than 52 years old. Performed at [facility] [address], [place], [name] [address]      Labs: BNP (last 3 results) Recent Labs    11/22/23 1212 12/04/23 1510 12/06/23 1248  BNP  110.3* 344.0* 277.7*   Basic Metabolic Panel: Recent Labs  Lab 12/04/23 1510 12/05/23 0442 12/05/23 1553 12/06/23 0358 12/07/23 0412  NA 147* 148* 145 145 142  K 4.1 4.3 4.1 3.8 4.0  CL 113* 113* 109 111 108  CO2 21* 21* 23 26 24   GLUCOSE 126* 172* 258* 147* 130*  BUN 28* 31* 36* 36* 33*  CREATININE 1.70* 1.69* 1.79* 1.57* 1.37*  CALCIUM 8.3* 8.4* 8.6* 8.5* 8.3*  MG  --   --   --  2.3 2.1  PHOS  --   --   --  2.5 1.9*   Liver Function Tests: Recent Labs  Lab 12/04/23 1510 12/05/23 0442 12/06/23 0358 12/07/23 0412  AST 17 14* 14* 16  ALT 12 12 11 13   ALKPHOS 46 50 44 41  BILITOT 1.2 0.9 0.7 0.6  PROT 6.4* 6.9 6.4* 6.0*  ALBUMIN 2.6* 2.5* 2.3* 2.2*   CBC: Recent Labs  Lab 12/04/23 1510 12/05/23 0442 12/06/23 0358 12/07/23 0412  WBC 10.1 9.4 12.1* 10.4  NEUTROABS 7.8*  --  9.7* 8.0*  HGB 10.5* 10.3* 9.4* 9.2*  HCT 34.3* 34.1* 30.8* 29.9*  MCV 94.5 95.3 94.8 92.0  PLT 295 288 292 271   BNP: Recent Labs  Lab 12/04/23 1510 12/06/23 1248  BNP 344.0* 277.7*   CBG: Recent Labs  Lab 12/07/23 0738 12/07/23 1201 12/07/23 1703 12/07/23 2017 12/08/23 0726  GLUCAP 114* 182* 248* 228* 129*   Sepsis Labs Recent Labs  Lab 12/04/23 1510 12/05/23 0442 12/06/23 0358 12/07/23 0412  WBC 10.1 9.4 12.1* 10.4   Microbiology Recent Results (from the past 240 hours)  Resp panel by RT-PCR (RSV, Flu A&B, Covid) Anterior Nasal Swab     Status: Abnormal   Collection Time: 12/04/23  3:10 PM   Specimen: Anterior Nasal Swab  Result Value Ref Range Status   SARS Coronavirus 2 by RT PCR POSITIVE (A) NEGATIVE Final    Comment: (NOTE) SARS-CoV-2 target nucleic acids are DETECTED.  The SARS-CoV-2 RNA is generally detectable in upper respiratory specimens during the acute phase of infection. Positive results are indicative of the presence of the identified virus, but do not rule out bacterial infection or co-infection with other pathogens not detected by the test.  Clinical correlation with patient history and other diagnostic information is necessary to determine patient infection status. The expected result is Negative.  Fact Sheet for Patients: BloggerCourse.com  Fact Sheet for Healthcare Providers: SeriousBroker.it  This test is not yet approved or cleared by the [place] and  has been authorized  for detection and/or diagnosis of SARS-CoV-2 by FDA under an Emergency Use Authorization (EUA).  This EUA will remain in effect (meaning this test can be used) for the duration of  the COVID-19 declaration under Section 564(b)(1) of the A ct, 21 U.S.C. section 360bbb-3(b)(1), unless the authorization is terminated or revoked sooner.     Influenza A by PCR NEGATIVE NEGATIVE Final   Influenza B by PCR NEGATIVE NEGATIVE Final    Comment: (NOTE) The Xpert Xpress SARS-CoV-2/FLU/RSV plus assay is intended as an aid in the diagnosis of influenza from Nasopharyngeal swab specimens and should not be used as a sole basis for treatment. Nasal washings and aspirates are unacceptable for Xpert Xpress SARS-CoV-2/FLU/RSV testing.  Fact Sheet for Patients: BloggerCourse.com  Fact Sheet for Healthcare Providers: SeriousBroker.it  This test is not yet approved or cleared by the [place] FDA and has been authorized for detection and/or diagnosis of SARS-CoV-2 by FDA under an Emergency Use Authorization (EUA). This EUA will remain in effect (meaning this test can be used) for the duration of the COVID-19 declaration under Section 564(b)(1) of the Act, 21 U.S.C. section 360bbb-3(b)(1), unless the authorization is terminated or revoked.     Resp Syncytial Virus by PCR NEGATIVE NEGATIVE Final    Comment: (NOTE) Fact Sheet for Patients: BloggerCourse.com  Fact Sheet for Healthcare  Providers: SeriousBroker.it  This test is not yet approved or cleared by the [place] FDA and has been authorized for detection and/or diagnosis of SARS-CoV-2 by FDA under an Emergency Use Authorization (EUA). This EUA will remain in effect (meaning this test can be used) for the duration of the COVID-19 declaration under Section 564(b)(1) of the Act, 21 U.S.C. section 360bbb-3(b)(1), unless the authorization is terminated or revoked.  Performed at [facility], [address] [place], [name] [address]   Blood culture (routine x 2)     Status: Abnormal   Collection Time: 12/04/23  3:19 PM   Specimen: BLOOD  Result Value Ref Range Status   Specimen Description   Final    BLOOD BLOOD LEFT ARM Performed at [facility], [address] [place], [name] [address]    Special Requests   Final    BOTTLES DRAWN AEROBIC ONLY Blood Culture results may not be optimal due to an inadequate volume of blood received in culture bottles Performed at [facility], [name] [place], [name] [address]    Culture  Setup Time   Final    GRAM POSITIVE COCCI AEROBIC BOTTLE ONLY CRITICAL RESULT CALLED TO, READ BACK BY AND VERIFIED WITH: PHARMD M LILLISTON 12/06/2023 @ 0134 BY AB    Culture (A)  Final    STAPHYLOCOCCUS EPIDERMIDIS THE SIGNIFICANCE OF ISOLATING THIS ORGANISM FROM A SINGLE SET OF BLOOD CULTURES WHEN MULTIPLE SETS ARE DRAWN IS UNCERTAIN. PLEASE NOTIFY THE MICROBIOLOGY DEPARTMENT WITHIN ONE WEEK IF SPECIATION AND SENSITIVITIES ARE REQUIRED. Performed at [facility], [address] [place], [name] [address]    Report Status 12/07/2023 FINAL  Final  Blood Culture ID Panel (Reflexed)     Status: Abnormal   Collection Time: 12/04/23  3:19 PM  Result Value Ref Range Status   Enterococcus faecalis NOT DETECTED NOT DETECTED Final   Enterococcus Faecium NOT DETECTED NOT DETECTED Final    Listeria monocytogenes NOT DETECTED NOT DETECTED Final   Staphylococcus species DETECTED (A) NOT DETECTED Final    Comment: CRITICAL RESULT CALLED TO, READ BACK BY AND VERIFIED WITH:  PHARMD M LILLISTON 12/06/2023 @ 0134 BY AB    Staphylococcus aureus (BCID) NOT DETECTED NOT DETECTED Final   Staphylococcus epidermidis DETECTED (A) NOT DETECTED Final    Comment: CRITICAL RESULT CALLED TO, READ BACK BY AND VERIFIED WITH: PHARMD M LILLISTON 12/06/2023 @ 0134 BY AB    Staphylococcus lugdunensis NOT DETECTED NOT DETECTED Final   Streptococcus species NOT DETECTED NOT DETECTED Final   Streptococcus agalactiae NOT DETECTED NOT DETECTED Final   Streptococcus pneumoniae NOT DETECTED NOT DETECTED Final   Streptococcus pyogenes NOT DETECTED NOT DETECTED Final   A.calcoaceticus-baumannii NOT DETECTED NOT DETECTED Final   Bacteroides fragilis NOT DETECTED NOT DETECTED Final   Enterobacterales NOT DETECTED NOT DETECTED Final   Enterobacter cloacae complex NOT DETECTED NOT DETECTED Final   Escherichia coli NOT DETECTED NOT DETECTED Final   Klebsiella aerogenes NOT DETECTED NOT DETECTED Final   Klebsiella oxytoca NOT DETECTED NOT DETECTED Final   Klebsiella pneumoniae NOT DETECTED NOT DETECTED Final   Proteus species NOT DETECTED NOT DETECTED Final   Salmonella species NOT DETECTED NOT DETECTED Final   Serratia marcescens NOT DETECTED NOT DETECTED Final   Haemophilus influenzae NOT DETECTED NOT DETECTED Final   Neisseria meningitidis NOT DETECTED NOT DETECTED Final   Pseudomonas aeruginosa NOT DETECTED NOT DETECTED Final   Stenotrophomonas maltophilia NOT DETECTED NOT DETECTED Final   Candida albicans NOT DETECTED NOT DETECTED Final   Candida auris NOT DETECTED NOT DETECTED Final   Candida glabrata NOT DETECTED NOT DETECTED Final   Candida krusei NOT DETECTED NOT DETECTED Final   Candida parapsilosis NOT DETECTED NOT DETECTED Final   Candida tropicalis NOT DETECTED NOT DETECTED Final   Cryptococcus  neoformans/gattii NOT DETECTED NOT DETECTED Final   Methicillin resistance mecA/C NOT DETECTED NOT DETECTED Final    Comment: Performed at [facility], [address] [place], [name] [address]  Blood culture (routine x 2)     Status: None (Preliminary result)   Collection Time: 12/04/23 10:06 PM   Specimen: BLOOD  Result Value Ref Range Status   Specimen Description   Final    BLOOD BLOOD LEFT HAND AEROBIC BOTTLE ONLY Performed at [facility], [address], [place], [name] [address]    Special Requests   Final    Blood Culture results may not be optimal due to an inadequate volume of blood received in culture bottles BOTTLES DRAWN AEROBIC ONLY Performed at [facility], [address] [place], [name] [address]    Culture   Final    NO GROWTH 3 DAYS Performed at [facility], [address] [place], [name] [address]    Report Status PENDING  Incomplete  MRSA Next Gen by PCR, Nasal     Status: None   Collection Time: 12/05/23 12:35 AM   Specimen: Nasal Mucosa; Nasal Swab  Result Value Ref Range Status   MRSA by PCR Next Gen NOT DETECTED NOT DETECTED Final    Comment: (NOTE) The GeneXpert MRSA Assay (FDA approved for NASAL specimens only), is one component of a comprehensive MRSA colonization surveillance program. It is not intended to diagnose MRSA infection nor to guide or monitor treatment for MRSA infections. Test performance is not FDA approved in patients less than 13 years old. Performed at [facility], [address] [place], [name] [address]     Procedures/Studies: CT CHEST WO CONTRAST Result Date: 12/05/2023 CLINICAL DATA:  Respiratory illness EXAM: CT CHEST WITHOUT CONTRAST TECHNIQUE: Multidetector  CT imaging of the chest was performed following the standard protocol without IV contrast. RADIATION DOSE REDUCTION: This exam was performed according to the departmental dose-optimization  program which includes automated exposure control, adjustment of the mA and/or kV according to patient size and/or use of iterative reconstruction technique. COMPARISON:  09/06/2023 FINDINGS: Cardiovascular: Aortic atherosclerosis. Dense aortic valve calcifications. Cardiomegaly. Three-vessel coronary artery calcifications. No pericardial effusion. Mediastinum/Nodes: No enlarged mediastinal, hilar, or axillary lymph nodes. Thyroid gland, trachea, and esophagus demonstrate no significant findings. Lungs/Pleura: Severe emphysema. Bibasilar scarring or atelectasis. Trace bilateral pleural effusions. Upper Abdomen: No acute abnormality. Musculoskeletal: No chest wall abnormality. No acute osseous findings. IMPRESSION: 1. Severe emphysema. 2. Trace bilateral pleural effusions. 3. Bibasilar scarring or atelectasis. 4. Cardiomegaly and coronary artery disease. 5. Dense aortic valve calcifications. Correlate for echocardiographic evidence of aortic valve dysfunction. Aortic Atherosclerosis (ICD10-I70.0) and Emphysema (ICD10-J43.9). Electronically Signed   By: Jearld Lesch M.D.   On: 12/05/2023 18:03   DG Chest Portable 1 View Result Date: 12/04/2023 CLINICAL DATA:  Cough and shortness of breath. Hypoxia. Diagnosed with COVID-19 2 weeks ago EXAM: PORTABLE CHEST 1 VIEW COMPARISON:  11/22/2023 FINDINGS: The cardiac silhouette remains mildly enlarged. Tortuous aorta interval linear density at the left lung base. The remainder of the lungs are clear with normal vascularity no pleural fluid. Lower thoracic spine degenerative changes. IMPRESSION: 1. Interval linear atelectasis at the left lung base. 2. Mild cardiomegaly. Electronically Signed   By: Beckie Salts M.D.   On: 12/04/2023 15:20   DG Chest Port 1 View Result Date: 11/22/2023 CLINICAL DATA:  Shortness of breath EXAM: PORTABLE CHEST 1 VIEW COMPARISON:  09/10/2023, 09/06/2023 FINDINGS: The heart size and mediastinal contours are stable. Chronically coarsened  interstitial markings. No focal airspace consolidation, pleural effusion, or pneumothorax. The visualized skeletal structures are unremarkable. IMPRESSION: Chronically coarsened interstitial markings. No acute cardiopulmonary findings. Electronically Signed   By: Duanne Guess D.O.   On: 11/22/2023 14:14    Time coordinating discharge: 40 mins  SIGNED:  Carollee Herter, DO [facility] 12/08/23, 9:51 AM

## 2023-12-08 NOTE — Assessment & Plan Note (Signed)
Chronic. Continue with coreg and supplemental O2.

## 2023-12-08 NOTE — Hospital Course (Signed)
HPI: Kyle [name] is a 73 y.o. 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  Patient was admitted to the hospital where he was treated and subsequently discharged to [facility] nursing home.  He was brought back again today with significant shortness of breath and hypoxia.  Patient is currently on 9 L of oxygen per minute but was brought in on oxygen by none rebreather bag.  Patient suspected to have had ongoing hypoxic respiratory failure triggered by his COVID-19 infection.  Repeat testing showed no evidence of infiltrates or CHF however patient is hypoxic.  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  He has been admitted with acute on chronic hypoxic respiratory failure.   Significant Events: Admitted 12/04/2023 acute on chronic respiratory failure with hypoxia   Significant Labs: Covid-19 POSITIVE WBC 10.1, HgB 10.5, plt 295 Na 147, K 4.1, CO2 of 21, BUN 28, Scr 1.7, glu 126 BNP 344  Significant Imaging Studies: CXR  Interval linear atelectasis at the left lung base.2. Mild cardiomegaly. CT chest  Severe emphysema. 2. Trace bilateral pleural effusions. 3. Bibasilar scarring or atelectasis. 4. Cardiomegaly and coronary artery disease. 5. Dense aortic valve calcifications. Correlate for echocardiographic evidence of aortic valve dysfunction  Antibiotic Therapy: Anti-infectives (From admission, onward)    Start     Dose/Rate Route Frequency Ordered Stop   12/04/23 2030  cefTRIAXone (ROCEPHIN) 2 g in sodium chloride  0.9 % 100 mL IVPB        2 g 200 mL/hr over 30 Minutes Intravenous Every 24 hours 12/04/23 1939     12/04/23 2030  azithromycin (ZITHROMAX) 500 mg in sodium chloride 0.9 % 250 mL IVPB        500 mg 250 mL/hr over 60 Minutes Intravenous Every 24 hours 12/04/23 1939         Procedures:   Consultants:

## 2023-12-08 NOTE — Assessment & Plan Note (Signed)
From admission to 12-07-2023. Discharged on 4.5 L from last hospitalization (brought into ED on NRB, on 9 L [place] at time of H&P) He's currently on 5 L, will work on weaning to his baseline.   CXR with linear atelectasis at L lung base. CT chest with severe emphysema, trace effusions . Continue abx while we treat as COPD exacerbation. Continue steroids for now. Notably recently treated for covid, diagnosed 1/27 - CT without evidence of pneumonia - will d/c isolation as he's beyond 10 days. Recent COVID 19 infection. D/c isolation  12-08-2023 stable on 4-5 L/min O2. Back to baseline.

## 2023-12-08 NOTE — Progress Notes (Signed)
PROGRESS NOTE    Kyle [name]  EAV:[id number] DOB: 02-10-1951 DOA: 12/04/2023 PCP: Levin Erp, MD  Subjective: Pt seen and examined. Pt doing well. TOC notified me that pt has SNF bed today.   Hospital Course: HPI: Kyle [name] is a 73 y.o. 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  Patient was admitted to the hospital where he was treated and subsequently discharged to [facility] nursing home.  He was brought back again today with significant shortness of breath and hypoxia.  Patient is currently on 9 L of oxygen per minute but was brought in on oxygen by none rebreather bag.  Patient suspected to have had ongoing hypoxic respiratory failure triggered by his COVID-19 infection.  Repeat testing showed no evidence of infiltrates or CHF however patient is hypoxic.  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  He has been admitted with acute on chronic hypoxic respiratory failure.   Significant Events: Admitted 12/04/2023 acute on chronic respiratory failure with hypoxia   Significant Labs: Covid-19 POSITIVE WBC 10.1, HgB 10.5, plt 295 Na 147, K 4.1, CO2 of 21, BUN 28, Scr 1.7, glu 126 BNP 344  Significant Imaging Studies: CXR  Interval linear atelectasis at the left lung base.2. Mild cardiomegaly. CT chest  Severe emphysema. 2. Trace bilateral pleural effusions. 3. Bibasilar scarring or atelectasis. 4. Cardiomegaly and coronary artery disease. 5. Dense aortic valve calcifications. Correlate for  echocardiographic evidence of aortic valve dysfunction  Antibiotic Therapy: Anti-infectives (From admission, onward)    Start     Dose/Rate Route Frequency Ordered Stop   12/04/23 2030  cefTRIAXone (ROCEPHIN) 2 g in sodium chloride 0.9 % 100 mL IVPB        2 g 200 mL/hr over 30 Minutes Intravenous Every 24 hours 12/04/23 1939     12/04/23 2030  azithromycin (ZITHROMAX) 500 mg in sodium chloride 0.9 % 250 mL IVPB        500 mg 250 mL/hr over 60 Minutes Intravenous Every 24 hours 12/04/23 1939         Procedures:   Consultants:     Assessment and Plan: * Acute on chronic respiratory failure with hypoxemia (HCC) From admission to 12-07-2023. Discharged on 4.5 L from last hospitalization (brought into ED on NRB, on 9 L [place] at time of H&P) He's currently on 5 L, will work on weaning to his baseline.   CXR with linear atelectasis at L lung base. CT chest with severe emphysema, trace effusions . Continue abx while we treat as COPD exacerbation. Continue steroids for now. Notably recently treated for covid, diagnosed 1/27 - CT without evidence of pneumonia - will d/c isolation as he's beyond 10 days. Recent COVID 19 infection. D/c isolation  12-08-2023 stable on 4-5 L/min O2. Back to baseline.  COPD (chronic obstructive pulmonary disease) (HCC) From admission to 12-07-2023. Acute COPD exacerbation.  Continue abx while we treat as COPD exacerbation. Continue steroids for now. Notably recently treated for covid, diagnosed 1/27   12-08-2023 DC to SNF with 12 day prednisone taper starting at 40 mg daily. Taper by 10 mg every 3 days. Continue  duonebs tid scheduled. Continue with routine inhalers.  Hypernatremia Treated with IVF. Resolved. Hx of poor po liquid intake. Therefore not on scheduled or PRN diuretics.  Pressure injury of skin Present on admission.  Pressure Injury 12/04/23 Ankle Anterior;Right Unstageable - Full thickness tissue loss in which the base of the injury is covered by  slough (yellow, tan, gray, green or brown) and/or eschar (tan, brown or black) in the wound bed.   Pressure Injury 12/04/23 Buttocks Right;Lateral Stage 2 -  Partial thickness loss of dermis presenting as a shallow open injury with a red, pink wound bed without slough.   Pressure Injury 12/04/23 Buttocks Right;Medial;Posterior Stage 2 -  Partial thickness loss of dermis presenting as a shallow open injury with a red, pink wound bed without slough.         CKD stage 3a, GFR 45-59 ml/min (HCC) - Baseline Scr 1.6-2.0 Chronic. Baseline Scr 1.6-2.0  Anticoagulant long-term use On eliquis for hx of chronic afib.  Diabetes mellitus type 2, insulin dependent (HCC) On SSI and tresiba.  Atrial fibrillation, chronic (HCC) Chronic on coreg and eliquis.  Pulmonary hypertension (HCC) Chronic. Continue with coreg and supplemental O2.  Chronic systolic CHF (congestive heart failure) (HCC) Stable. Not on scheduled or prn diuretics due to consistently poor po liquid intake. Remains on coreg 6.25 mg bid, hdyralazine and imdure. Not on ARB/ACEI due to CKD stage 3a.  Peripheral arterial disease (HCC) Chronic.  Hypertension Resume home HTN meds. Stop norvasc as it was held during last hospital admission.  Aortic insufficiency Chronic.   DVT prophylaxis:  apixaban (ELIQUIS) tablet 5 mg     Code Status: Full Code Family Communication: no family at bedside Disposition Plan: called pt's wife @ [phone]. No answer Reason for continuing need for hospitalization: stable for DC.  Objective: Vitals:   12/07/23 1949 12/07/23 2134 12/08/23 0328 12/08/23 0803  BP: (!) 152/67  (!) 178/63   Pulse: (!) 56  (!) 47   Resp: 17  14   Temp: (!) 97.5 F (36.4 C)  97.7 F (36.5 C)   TempSrc: Oral  Oral   SpO2: 98% 92% 97% 100%    Intake/Output Summary (Last 24 hours) at 12/08/2023 0948 Last data filed at 12/08/2023 0830 Gross per 24 hour  Intake --  Output 650 ml  Net -650 ml   There were no  vitals filed for this visit.  Examination:  Physical Exam Vitals and nursing note reviewed.  Constitutional:      General: He is not in acute distress.    Appearance: He is not toxic-appearing.     Comments: Chronically ill appearing  HENT:     Head: Normocephalic and atraumatic.  Eyes:     General: No scleral icterus. Cardiovascular:     Rate and Rhythm: Normal rate and regular rhythm.  Pulmonary:     Effort: Pulmonary effort is normal. No respiratory distress.     Breath sounds: No wheezing.  Abdominal:     General: Abdomen is flat. Bowel sounds are normal.     Palpations: Abdomen is soft.  Musculoskeletal:     Right lower leg: No edema.     Left lower leg: No edema.  Skin:    General: Skin is warm and dry.     Capillary Refill: Capillary refill takes less than 2 seconds.     Data Reviewed: I have personally reviewed following labs and imaging studies  CBC: Recent Labs  Lab 12/04/23 1510 12/05/23 0442 12/06/23 0358 12/07/23  0412  WBC 10.1 9.4 12.1* 10.4  NEUTROABS 7.8*  --  9.7* 8.0*  HGB 10.5* 10.3* 9.4* 9.2*  HCT 34.3* 34.1* 30.8* 29.9*  MCV 94.5 95.3 94.8 92.0  PLT 295 288 292 271   Basic Metabolic Panel: Recent Labs  Lab 12/04/23 1510 12/05/23 0442 12/05/23 1553 12/06/23 0358 12/07/23 0412  NA 147* 148* 145 145 142  K 4.1 4.3 4.1 3.8 4.0  CL 113* 113* 109 111 108  CO2 21* 21* 23 26 24   GLUCOSE 126* 172* 258* 147* 130*  BUN 28* 31* 36* 36* 33*  CREATININE 1.70* 1.69* 1.79* 1.57* 1.37*  CALCIUM 8.3* 8.4* 8.6* 8.5* 8.3*  MG  --   --   --  2.3 2.1  PHOS  --   --   --  2.5 1.9*   GFR: CrCl cannot be calculated (Unknown ideal weight.). Liver Function Tests: Recent Labs  Lab 12/04/23 1510 12/05/23 0442 12/06/23 0358 12/07/23 0412  AST 17 14* 14* 16  ALT 12 12 11 13   ALKPHOS 46 50 44 41  BILITOT 1.2 0.9 0.7 0.6  PROT 6.4* 6.9 6.4* 6.0*  ALBUMIN 2.6* 2.5* 2.3* 2.2*   BNP (last 3 results) Recent Labs    11/22/23 1212 12/04/23 1510  12/06/23 1248  BNP 110.3* 344.0* 277.7*   CBG: Recent Labs  Lab 12/07/23 0738 12/07/23 1201 12/07/23 1703 12/07/23 2017 12/08/23 0726  GLUCAP 114* 182* 248* 228* 129*   Sepsis Labs: Recent Labs  Lab 12/04/23 1510 12/04/23 2206  LATICACIDVEN 1.0 0.8    Recent Results (from the past 240 hours)  Resp panel by RT-PCR (RSV, Flu A&B, Covid) Anterior Nasal Swab     Status: Abnormal   Collection Time: 12/04/23  3:10 PM   Specimen: Anterior Nasal Swab  Result Value Ref Range Status   SARS Coronavirus 2 by RT PCR POSITIVE (A) NEGATIVE Final    Comment: (NOTE) SARS-CoV-2 target nucleic acids are DETECTED.  The SARS-CoV-2 RNA is generally detectable in upper respiratory specimens during the acute phase of infection. Positive results are indicative of the presence of the identified virus, but do not rule out bacterial infection or co-infection with other pathogens not detected by the test. Clinical correlation with patient history and other diagnostic information is necessary to determine patient infection status. The expected result is Negative.  Fact Sheet for Patients: BloggerCourse.com  Fact Sheet for Healthcare Providers: SeriousBroker.it  This test is not yet approved or cleared by the [place] FDA and  has been authorized for detection and/or diagnosis of SARS-CoV-2 by FDA under an Emergency Use Authorization (EUA).  This EUA will remain in effect (meaning this test can be used) for the duration of  the COVID-19 declaration under Section 564(b)(1) of the A ct, 21 U.S.C. section 360bbb-3(b)(1), unless the authorization is terminated or revoked sooner.     Influenza A by PCR NEGATIVE NEGATIVE Final   Influenza B by PCR NEGATIVE NEGATIVE Final    Comment: (NOTE) The Xpert Xpress SARS-CoV-2/FLU/RSV plus assay is intended as an aid in the diagnosis of influenza from Nasopharyngeal swab specimens and should not be  used as a sole basis for treatment. Nasal washings and aspirates are unacceptable for Xpert Xpress SARS-CoV-2/FLU/RSV testing.  Fact Sheet for Patients: BloggerCourse.com  Fact Sheet for Healthcare Providers: SeriousBroker.it  This test is not yet approved or cleared by the [place] FDA and has been authorized for detection and/or diagnosis of SARS-CoV-2 by FDA under an Emergency Use Authorization (EUA).  This EUA will remain in effect (meaning this test can be used) for the duration of the COVID-19 declaration under Section 564(b)(1) of the Act, 21 U.S.C. section 360bbb-3(b)(1), unless the authorization is terminated or revoked.     Resp Syncytial Virus by PCR NEGATIVE NEGATIVE Final    Comment: (NOTE) Fact Sheet for Patients: BloggerCourse.com  Fact Sheet for Healthcare Providers: SeriousBroker.it  This test is not yet approved or cleared by the [place] FDA and has been authorized for detection and/or diagnosis of SARS-CoV-2 by FDA under an Emergency Use Authorization (EUA). This EUA will remain in effect (meaning this test can be used) for the duration of the COVID-19 declaration under Section 564(b)(1) of the Act, 21 U.S.C. section 360bbb-3(b)(1), unless the authorization is terminated or revoked.  Performed at [facility], [address], [place], [name] [address]   Blood culture (routine x 2)     Status: Abnormal   Collection Time: 12/04/23  3:19 PM   Specimen: BLOOD  Result Value Ref Range Status   Specimen Description   Final    BLOOD BLOOD LEFT ARM Performed at [facility], [address] [place], [name] [address]    Special Requests   Final    BOTTLES DRAWN AEROBIC ONLY Blood Culture results may not be optimal due to an inadequate volume of blood received in culture bottles Performed at [facility], [address], [place], [name] [address]    Culture  Setup Time   Final    GRAM POSITIVE COCCI AEROBIC BOTTLE ONLY CRITICAL RESULT CALLED TO, READ BACK BY AND VERIFIED WITH: PHARMD M LILLISTON 12/06/2023 @ 0134 BY AB    Culture (A)  Final    STAPHYLOCOCCUS EPIDERMIDIS THE SIGNIFICANCE OF ISOLATING THIS ORGANISM FROM A SINGLE SET OF BLOOD CULTURES WHEN MULTIPLE SETS ARE DRAWN IS UNCERTAIN. PLEASE NOTIFY THE MICROBIOLOGY DEPARTMENT WITHIN ONE WEEK IF SPECIATION AND SENSITIVITIES ARE REQUIRED. Performed at [facility], [address], [place], [name] [address]    Report Status 12/07/2023 FINAL  Final  Blood Culture ID Panel (Reflexed)     Status: Abnormal   Collection Time: 12/04/23  3:19 PM  Result Value Ref Range Status   Enterococcus faecalis NOT DETECTED NOT DETECTED Final   Enterococcus Faecium NOT DETECTED NOT DETECTED Final   Listeria monocytogenes NOT DETECTED NOT DETECTED Final   Staphylococcus species DETECTED (A) NOT DETECTED Final    Comment: CRITICAL RESULT CALLED TO, READ BACK BY AND VERIFIED WITH: PHARMD M LILLISTON 12/06/2023 @ 0134 BY AB    Staphylococcus aureus (BCID) NOT DETECTED NOT DETECTED Final   Staphylococcus epidermidis DETECTED (A) NOT DETECTED Final    Comment: CRITICAL RESULT CALLED TO, READ BACK BY AND VERIFIED WITH: PHARMD M LILLISTON 12/06/2023 @ 0134 BY AB    Staphylococcus lugdunensis NOT DETECTED NOT DETECTED Final   Streptococcus species NOT DETECTED NOT DETECTED Final   Streptococcus agalactiae NOT DETECTED NOT DETECTED Final   Streptococcus pneumoniae NOT DETECTED NOT DETECTED Final   Streptococcus pyogenes NOT DETECTED NOT DETECTED Final   A.calcoaceticus-baumannii NOT DETECTED NOT DETECTED Final   Bacteroides fragilis NOT DETECTED NOT DETECTED Final   Enterobacterales NOT DETECTED NOT DETECTED Final   Enterobacter cloacae complex NOT DETECTED NOT DETECTED Final   Escherichia coli NOT DETECTED NOT DETECTED Final   Klebsiella  aerogenes NOT DETECTED NOT DETECTED Final   Klebsiella oxytoca NOT DETECTED NOT DETECTED Final   Klebsiella pneumoniae NOT DETECTED NOT DETECTED  Final   Proteus species NOT DETECTED NOT DETECTED Final   Salmonella species NOT DETECTED NOT DETECTED Final   Serratia marcescens NOT DETECTED NOT DETECTED Final   Haemophilus influenzae NOT DETECTED NOT DETECTED Final   Neisseria meningitidis NOT DETECTED NOT DETECTED Final   Pseudomonas aeruginosa NOT DETECTED NOT DETECTED Final   Stenotrophomonas maltophilia NOT DETECTED NOT DETECTED Final   Candida albicans NOT DETECTED NOT DETECTED Final   Candida auris NOT DETECTED NOT DETECTED Final   Candida glabrata NOT DETECTED NOT DETECTED Final   Candida krusei NOT DETECTED NOT DETECTED Final   Candida parapsilosis NOT DETECTED NOT DETECTED Final   Candida tropicalis NOT DETECTED NOT DETECTED Final   Cryptococcus neoformans/gattii NOT DETECTED NOT DETECTED Final   Methicillin resistance mecA/C NOT DETECTED NOT DETECTED Final    Comment: Performed at [facility], [address], [place], [name] [address]  Blood culture (routine x 2)     Status: None (Preliminary result)   Collection Time: 12/04/23 10:06 PM   Specimen: BLOOD  Result Value Ref Range Status   Specimen Description   Final    BLOOD BLOOD LEFT HAND AEROBIC BOTTLE ONLY Performed at [facility], [address], [place], [name] [address]    Special Requests   Final    Blood Culture results may not be optimal due to an inadequate volume of blood received in culture bottles BOTTLES DRAWN AEROBIC ONLY Performed at [facility], [address], [place], [name] [address]    Culture   Final    NO GROWTH 3 DAYS Performed at [facility], [address], [place], [name] [address]    Report Status PENDING  Incomplete  MRSA Next Gen by PCR, Nasal     Status: None   Collection Time: 12/05/23 12:35 AM   Specimen: Nasal Mucosa; Nasal Swab   Result Value Ref Range Status   MRSA by PCR Next Gen NOT DETECTED NOT DETECTED Final    Comment: (NOTE) The GeneXpert MRSA Assay (FDA approved for NASAL specimens only), is one component of a comprehensive MRSA colonization surveillance program. It is not intended to diagnose MRSA infection nor to guide or monitor treatment for MRSA infections. Test performance is not FDA approved in patients less than 79 years old. Performed at [facility], [address] [place], [name] [address]     Scheduled Meds:  apixaban  5 mg Oral BID   carvedilol  6.25 mg Oral BID WC   cloNIDine  0.2 mg Oral 3 times per day   fluticasone furoate-vilanterol  1 puff Inhalation Daily   And   umeclidinium bromide  1 puff Inhalation Daily   gabapentin  600 mg Oral QHS   insulin aspart  0-15 Units Subcutaneous TID WC   insulin aspart  0-5 Units Subcutaneous QHS   ipratropium-albuterol  3 mL Nebulization BID   levETIRAcetam  500 mg Oral BID   mirtazapine  7.5 mg Oral QHS   pantoprazole  40 mg Oral Daily   predniSONE  40 mg Oral Q breakfast   rosuvastatin  10 mg Oral QHS   Continuous Infusions:  azithromycin     cefTRIAXone (ROCEPHIN)  IV 2 g (12/07/23 2253)     LOS: 4 days   Time spent: 40 minutes  Carollee Herter, DO  [facility]  12/08/2023, 9:48 AM

## 2023-12-08 NOTE — Assessment & Plan Note (Signed)
" >>  ASSESSMENT AND PLAN FOR PERIPHERAL ARTERIAL DISEASE WRITTEN ON 12/08/2023  9:44 AM BY CHEN, ERIC, DO  Chronic. "

## 2023-12-08 NOTE — Assessment & Plan Note (Signed)
Chronic.

## 2023-12-08 NOTE — Assessment & Plan Note (Signed)
On eliquis for hx of chronic afib.

## 2023-12-08 NOTE — Assessment & Plan Note (Signed)
Chronic on coreg and eliquis.

## 2023-12-08 NOTE — Assessment & Plan Note (Signed)
Stable. Not on scheduled or prn diuretics due to consistently poor po liquid intake. Remains on coreg 6.25 mg bid, hdyralazine and imdure. Not on ARB/ACEI due to CKD stage 3a.

## 2023-12-08 NOTE — Subjective & Objective (Signed)
Pt seen and examined. Pt doing well. TOC notified me that pt has SNF bed today.

## 2023-12-08 NOTE — Assessment & Plan Note (Signed)
Resume home HTN meds. Stop norvasc as it was held during last hospital admission.

## 2023-12-08 NOTE — Assessment & Plan Note (Signed)
Treated with IVF. Resolved. Hx of poor po liquid intake. Therefore not on scheduled or PRN diuretics.

## 2023-12-08 NOTE — Progress Notes (Signed)
PHARMACIST - PHYSICIAN COMMUNICATION DR:   Lowell Guitar CONCERNING: Antibiotic IV to Oral Route Change Policy  RECOMMENDATION: This patient is receiving azithromycin by the intravenous route.  Based on criteria approved by the Pharmacy and Therapeutics Committee, the antibiotic(s) is/are being converted to the equivalent oral dose form(s).   DESCRIPTION: These criteria include: Patient being treated for a respiratory tract infection, urinary tract infection, cellulitis or clostridium difficile associated diarrhea if on metronidazole The patient is not neutropenic and does not exhibit a GI malabsorption state The patient is eating (either orally or via tube) and/or has been taking other orally administered medications for a least 24 hours The patient is improving clinically and has a Tmax < 100.5  If you have questions about this conversion, please contact the Pharmacy Department   Cindi Carbon, PharmD 12/08/23 10:03 AM

## 2023-12-08 NOTE — Assessment & Plan Note (Signed)
From admission to 12-07-2023. Acute COPD exacerbation.  Continue abx while we treat as COPD exacerbation. Continue steroids for now. Notably recently treated for covid, diagnosed 1/27   12-08-2023 DC to SNF with 12 day prednisone taper starting at 40 mg daily. Taper by 10 mg every 3 days. Continue duonebs tid scheduled. Continue with routine inhalers.

## 2023-12-08 NOTE — Assessment & Plan Note (Signed)
Present on admission.  Pressure Injury 12/04/23 Ankle Anterior;Right Unstageable - Full thickness tissue loss in which the base of the injury is covered by slough (yellow, tan, gray, green or brown) and/or eschar (tan, brown or black) in the wound bed.   Pressure Injury 12/04/23 Buttocks Right;Lateral Stage 2 -  Partial thickness loss of dermis presenting as a shallow open injury with a red, pink wound bed without slough.   Pressure Injury 12/04/23 Buttocks Right;Medial;Posterior Stage 2 -  Partial thickness loss of dermis presenting as a shallow open injury with a red, pink wound bed without slough.

## 2023-12-08 NOTE — Assessment & Plan Note (Signed)
On SSI and tresiba.

## 2023-12-08 NOTE — TOC Transition Note (Addendum)
Transition of Care [facility]) - Discharge Note   Patient Details  Name: Kyle [name] MRN: [id number] Date of Birth: 1951/04/29  [facility] CM/SW Contact:  Larrie Kass, LCSW Phone Number: 12/08/2023, 9:49 AM   Clinical Narrative:    Pt's insurance [employer] was approved for SNF placement. Pt to d/c to [facility] , room 218 , RN to call report to [phone]. Pt is requesting [facility] to facility. the pt's wife will pick up the pt's wheelchair from the facility and bring it to the hospital for CSW to arrange transportation.    Adden 12:30pm CSW contacted [facility] for wheelchair transport to the pt's facility, but they reported not having available vans at the moment. The CSW then called [facility], and they will call the nurse's station when they arrive around 2:30 p.m. TOC sign-of.  Adden  2:30pm CSW received a message from pt's RN , pt's does not have home O2 to transport pt via [facility]. CSW spoke with pt's wife who stated she did not bring pt's portable O2 tank. CSW has arranged for EMS transport to the facility. TOC sign off.    Final next level of care: Skilled Nursing Facility Barriers to Discharge: Barriers Resolved   Patient Goals and CMS Choice            Discharge Placement                  Name of family member notified: [name],Kyle (Spouse)  [phone] (Mobile) Patient and family notified of of transfer: 12/08/23  Discharge Plan and Services Additional resources added to the After Visit Summary for                                       Social Drivers of Health (SDOH) Interventions SDOH Screenings   Food Insecurity: No Food Insecurity (12/05/2023)  Housing: Low Risk  (12/05/2023)  Transportation Needs: No Transportation Needs (12/05/2023)  Utilities: Not At Risk (12/05/2023)  Alcohol Screen: Low Risk  (03/05/2023)  Depression (PHQ2-9): Low Risk  (03/05/2023)  Financial Resource Strain: Low Risk  (03/05/2023)   Physical Activity: Sufficiently Active (03/05/2023)  Social Connections: Moderately Isolated (12/05/2023)  Stress: No Stress Concern Present (03/05/2023)  Tobacco Use: Medium Risk (11/22/2023)     Readmission Risk Interventions     No data to display

## 2023-12-08 NOTE — Assessment & Plan Note (Signed)
Chronic. Baseline Scr 1.6-2.0

## 2023-12-09 DIAGNOSIS — E785 Hyperlipidemia, unspecified: Secondary | ICD-10-CM | POA: Diagnosis not present

## 2023-12-09 DIAGNOSIS — I619 Nontraumatic intracerebral hemorrhage, unspecified: Secondary | ICD-10-CM | POA: Diagnosis not present

## 2023-12-09 DIAGNOSIS — E1122 Type 2 diabetes mellitus with diabetic chronic kidney disease: Secondary | ICD-10-CM | POA: Diagnosis not present

## 2023-12-09 DIAGNOSIS — M6281 Muscle weakness (generalized): Secondary | ICD-10-CM | POA: Diagnosis not present

## 2023-12-09 DIAGNOSIS — R262 Difficulty in walking, not elsewhere classified: Secondary | ICD-10-CM | POA: Diagnosis not present

## 2023-12-09 DIAGNOSIS — J449 Chronic obstructive pulmonary disease, unspecified: Secondary | ICD-10-CM | POA: Diagnosis not present

## 2023-12-09 DIAGNOSIS — I5021 Acute systolic (congestive) heart failure: Secondary | ICD-10-CM | POA: Diagnosis not present

## 2023-12-09 DIAGNOSIS — R41841 Cognitive communication deficit: Secondary | ICD-10-CM | POA: Diagnosis not present

## 2023-12-09 DIAGNOSIS — J9611 Chronic respiratory failure with hypoxia: Secondary | ICD-10-CM | POA: Diagnosis not present

## 2023-12-10 DIAGNOSIS — E1122 Type 2 diabetes mellitus with diabetic chronic kidney disease: Secondary | ICD-10-CM | POA: Diagnosis not present

## 2023-12-10 DIAGNOSIS — E785 Hyperlipidemia, unspecified: Secondary | ICD-10-CM | POA: Diagnosis not present

## 2023-12-10 DIAGNOSIS — M6281 Muscle weakness (generalized): Secondary | ICD-10-CM | POA: Diagnosis not present

## 2023-12-10 DIAGNOSIS — R41841 Cognitive communication deficit: Secondary | ICD-10-CM | POA: Diagnosis not present

## 2023-12-10 DIAGNOSIS — J9611 Chronic respiratory failure with hypoxia: Secondary | ICD-10-CM | POA: Diagnosis not present

## 2023-12-10 DIAGNOSIS — I5021 Acute systolic (congestive) heart failure: Secondary | ICD-10-CM | POA: Diagnosis not present

## 2023-12-10 DIAGNOSIS — J449 Chronic obstructive pulmonary disease, unspecified: Secondary | ICD-10-CM | POA: Diagnosis not present

## 2023-12-10 DIAGNOSIS — R262 Difficulty in walking, not elsewhere classified: Secondary | ICD-10-CM | POA: Diagnosis not present

## 2023-12-10 DIAGNOSIS — I619 Nontraumatic intracerebral hemorrhage, unspecified: Secondary | ICD-10-CM | POA: Diagnosis not present

## 2023-12-10 LAB — CULTURE, BLOOD (ROUTINE X 2): Culture: NO GROWTH

## 2023-12-13 DIAGNOSIS — J9611 Chronic respiratory failure with hypoxia: Secondary | ICD-10-CM | POA: Diagnosis not present

## 2023-12-13 DIAGNOSIS — R41841 Cognitive communication deficit: Secondary | ICD-10-CM | POA: Diagnosis not present

## 2023-12-13 DIAGNOSIS — J449 Chronic obstructive pulmonary disease, unspecified: Secondary | ICD-10-CM | POA: Diagnosis not present

## 2023-12-13 DIAGNOSIS — E1122 Type 2 diabetes mellitus with diabetic chronic kidney disease: Secondary | ICD-10-CM | POA: Diagnosis not present

## 2023-12-13 DIAGNOSIS — M6281 Muscle weakness (generalized): Secondary | ICD-10-CM | POA: Diagnosis not present

## 2023-12-13 DIAGNOSIS — E785 Hyperlipidemia, unspecified: Secondary | ICD-10-CM | POA: Diagnosis not present

## 2023-12-13 DIAGNOSIS — I619 Nontraumatic intracerebral hemorrhage, unspecified: Secondary | ICD-10-CM | POA: Diagnosis not present

## 2023-12-13 DIAGNOSIS — I5021 Acute systolic (congestive) heart failure: Secondary | ICD-10-CM | POA: Diagnosis not present

## 2023-12-13 DIAGNOSIS — R262 Difficulty in walking, not elsewhere classified: Secondary | ICD-10-CM | POA: Diagnosis not present

## 2023-12-14 DIAGNOSIS — L97921 Non-pressure chronic ulcer of unspecified part of left lower leg limited to breakdown of skin: Secondary | ICD-10-CM | POA: Diagnosis not present

## 2023-12-14 DIAGNOSIS — L97911 Non-pressure chronic ulcer of unspecified part of right lower leg limited to breakdown of skin: Secondary | ICD-10-CM | POA: Diagnosis not present

## 2023-12-14 DIAGNOSIS — L97418 Non-pressure chronic ulcer of right heel and midfoot with other specified severity: Secondary | ICD-10-CM | POA: Diagnosis not present

## 2023-12-14 DIAGNOSIS — L97318 Non-pressure chronic ulcer of right ankle with other specified severity: Secondary | ICD-10-CM | POA: Diagnosis not present

## 2023-12-15 DIAGNOSIS — E785 Hyperlipidemia, unspecified: Secondary | ICD-10-CM | POA: Diagnosis not present

## 2023-12-15 DIAGNOSIS — J449 Chronic obstructive pulmonary disease, unspecified: Secondary | ICD-10-CM | POA: Diagnosis not present

## 2023-12-15 DIAGNOSIS — I619 Nontraumatic intracerebral hemorrhage, unspecified: Secondary | ICD-10-CM | POA: Diagnosis not present

## 2023-12-15 DIAGNOSIS — R41841 Cognitive communication deficit: Secondary | ICD-10-CM | POA: Diagnosis not present

## 2023-12-15 DIAGNOSIS — I5021 Acute systolic (congestive) heart failure: Secondary | ICD-10-CM | POA: Diagnosis not present

## 2023-12-15 DIAGNOSIS — J9611 Chronic respiratory failure with hypoxia: Secondary | ICD-10-CM | POA: Diagnosis not present

## 2023-12-15 DIAGNOSIS — E1122 Type 2 diabetes mellitus with diabetic chronic kidney disease: Secondary | ICD-10-CM | POA: Diagnosis not present

## 2023-12-15 DIAGNOSIS — R262 Difficulty in walking, not elsewhere classified: Secondary | ICD-10-CM | POA: Diagnosis not present

## 2023-12-15 DIAGNOSIS — M6281 Muscle weakness (generalized): Secondary | ICD-10-CM | POA: Diagnosis not present

## 2023-12-17 DIAGNOSIS — I5021 Acute systolic (congestive) heart failure: Secondary | ICD-10-CM | POA: Diagnosis not present

## 2023-12-17 DIAGNOSIS — I619 Nontraumatic intracerebral hemorrhage, unspecified: Secondary | ICD-10-CM | POA: Diagnosis not present

## 2023-12-17 DIAGNOSIS — E785 Hyperlipidemia, unspecified: Secondary | ICD-10-CM | POA: Diagnosis not present

## 2023-12-17 DIAGNOSIS — J9611 Chronic respiratory failure with hypoxia: Secondary | ICD-10-CM | POA: Diagnosis not present

## 2023-12-17 DIAGNOSIS — R262 Difficulty in walking, not elsewhere classified: Secondary | ICD-10-CM | POA: Diagnosis not present

## 2023-12-17 DIAGNOSIS — M6281 Muscle weakness (generalized): Secondary | ICD-10-CM | POA: Diagnosis not present

## 2023-12-17 DIAGNOSIS — J449 Chronic obstructive pulmonary disease, unspecified: Secondary | ICD-10-CM | POA: Diagnosis not present

## 2023-12-17 DIAGNOSIS — E1122 Type 2 diabetes mellitus with diabetic chronic kidney disease: Secondary | ICD-10-CM | POA: Diagnosis not present

## 2023-12-17 DIAGNOSIS — R41841 Cognitive communication deficit: Secondary | ICD-10-CM | POA: Diagnosis not present

## 2023-12-18 ENCOUNTER — Other Ambulatory Visit: Payer: Self-pay | Admitting: Cardiology

## 2023-12-20 DIAGNOSIS — R41841 Cognitive communication deficit: Secondary | ICD-10-CM | POA: Diagnosis not present

## 2023-12-20 DIAGNOSIS — J449 Chronic obstructive pulmonary disease, unspecified: Secondary | ICD-10-CM | POA: Diagnosis not present

## 2023-12-20 DIAGNOSIS — R262 Difficulty in walking, not elsewhere classified: Secondary | ICD-10-CM | POA: Diagnosis not present

## 2023-12-20 DIAGNOSIS — M6281 Muscle weakness (generalized): Secondary | ICD-10-CM | POA: Diagnosis not present

## 2023-12-20 DIAGNOSIS — J9611 Chronic respiratory failure with hypoxia: Secondary | ICD-10-CM | POA: Diagnosis not present

## 2023-12-20 DIAGNOSIS — I619 Nontraumatic intracerebral hemorrhage, unspecified: Secondary | ICD-10-CM | POA: Diagnosis not present

## 2023-12-20 DIAGNOSIS — E785 Hyperlipidemia, unspecified: Secondary | ICD-10-CM | POA: Diagnosis not present

## 2023-12-20 DIAGNOSIS — E1122 Type 2 diabetes mellitus with diabetic chronic kidney disease: Secondary | ICD-10-CM | POA: Diagnosis not present

## 2023-12-20 DIAGNOSIS — I5021 Acute systolic (congestive) heart failure: Secondary | ICD-10-CM | POA: Diagnosis not present

## 2023-12-22 DIAGNOSIS — J449 Chronic obstructive pulmonary disease, unspecified: Secondary | ICD-10-CM | POA: Diagnosis not present

## 2023-12-22 DIAGNOSIS — E785 Hyperlipidemia, unspecified: Secondary | ICD-10-CM | POA: Diagnosis not present

## 2023-12-22 DIAGNOSIS — E1122 Type 2 diabetes mellitus with diabetic chronic kidney disease: Secondary | ICD-10-CM | POA: Diagnosis not present

## 2023-12-22 DIAGNOSIS — R41841 Cognitive communication deficit: Secondary | ICD-10-CM | POA: Diagnosis not present

## 2023-12-22 DIAGNOSIS — J9611 Chronic respiratory failure with hypoxia: Secondary | ICD-10-CM | POA: Diagnosis not present

## 2023-12-22 DIAGNOSIS — I619 Nontraumatic intracerebral hemorrhage, unspecified: Secondary | ICD-10-CM | POA: Diagnosis not present

## 2023-12-22 DIAGNOSIS — R262 Difficulty in walking, not elsewhere classified: Secondary | ICD-10-CM | POA: Diagnosis not present

## 2023-12-22 DIAGNOSIS — M6281 Muscle weakness (generalized): Secondary | ICD-10-CM | POA: Diagnosis not present

## 2023-12-22 DIAGNOSIS — I5021 Acute systolic (congestive) heart failure: Secondary | ICD-10-CM | POA: Diagnosis not present

## 2023-12-23 DIAGNOSIS — E1151 Type 2 diabetes mellitus with diabetic peripheral angiopathy without gangrene: Secondary | ICD-10-CM | POA: Diagnosis not present

## 2023-12-23 DIAGNOSIS — J449 Chronic obstructive pulmonary disease, unspecified: Secondary | ICD-10-CM | POA: Diagnosis not present

## 2023-12-23 DIAGNOSIS — I69322 Dysarthria following cerebral infarction: Secondary | ICD-10-CM | POA: Diagnosis not present

## 2023-12-23 DIAGNOSIS — I69351 Hemiplegia and hemiparesis following cerebral infarction affecting right dominant side: Secondary | ICD-10-CM | POA: Diagnosis not present

## 2023-12-23 DIAGNOSIS — I130 Hypertensive heart and chronic kidney disease with heart failure and stage 1 through stage 4 chronic kidney disease, or unspecified chronic kidney disease: Secondary | ICD-10-CM | POA: Diagnosis not present

## 2023-12-23 DIAGNOSIS — I69392 Facial weakness following cerebral infarction: Secondary | ICD-10-CM | POA: Diagnosis not present

## 2023-12-23 DIAGNOSIS — I5022 Chronic systolic (congestive) heart failure: Secondary | ICD-10-CM | POA: Diagnosis not present

## 2023-12-23 DIAGNOSIS — E1122 Type 2 diabetes mellitus with diabetic chronic kidney disease: Secondary | ICD-10-CM | POA: Diagnosis not present

## 2023-12-23 DIAGNOSIS — N183 Chronic kidney disease, stage 3 unspecified: Secondary | ICD-10-CM | POA: Diagnosis not present

## 2023-12-24 ENCOUNTER — Telehealth: Payer: Self-pay

## 2023-12-24 NOTE — Telephone Encounter (Signed)
 Received call from Zella Ball, RN with [facility] regarding patient.   She is asking if provider would be okay with patient having a virtual visit for hospital follow up.   Wife reports that they are having a difficult time getting patient into wheelchair, to where they would not be able to come into the office for a visit.   Will forward to Dr. Laroy Apple.   Wife is requesting returned call at [phone].  Veronda Prude, RN

## 2023-12-27 NOTE — Telephone Encounter (Signed)
 Contacted the wife to schedule the patient virtual visit but she did not answer. I did leave a voice mail to for her to contact us back to get him scheduled for a virtual visit.

## 2023-12-28 DIAGNOSIS — I130 Hypertensive heart and chronic kidney disease with heart failure and stage 1 through stage 4 chronic kidney disease, or unspecified chronic kidney disease: Secondary | ICD-10-CM | POA: Diagnosis not present

## 2023-12-28 DIAGNOSIS — E1122 Type 2 diabetes mellitus with diabetic chronic kidney disease: Secondary | ICD-10-CM | POA: Diagnosis not present

## 2023-12-28 DIAGNOSIS — I69392 Facial weakness following cerebral infarction: Secondary | ICD-10-CM | POA: Diagnosis not present

## 2023-12-28 DIAGNOSIS — I5022 Chronic systolic (congestive) heart failure: Secondary | ICD-10-CM | POA: Diagnosis not present

## 2023-12-28 DIAGNOSIS — N183 Chronic kidney disease, stage 3 unspecified: Secondary | ICD-10-CM | POA: Diagnosis not present

## 2023-12-28 DIAGNOSIS — E1151 Type 2 diabetes mellitus with diabetic peripheral angiopathy without gangrene: Secondary | ICD-10-CM | POA: Diagnosis not present

## 2023-12-28 DIAGNOSIS — J449 Chronic obstructive pulmonary disease, unspecified: Secondary | ICD-10-CM | POA: Diagnosis not present

## 2023-12-28 DIAGNOSIS — I69322 Dysarthria following cerebral infarction: Secondary | ICD-10-CM | POA: Diagnosis not present

## 2023-12-28 DIAGNOSIS — I69351 Hemiplegia and hemiparesis following cerebral infarction affecting right dominant side: Secondary | ICD-10-CM | POA: Diagnosis not present

## 2023-12-29 ENCOUNTER — Other Ambulatory Visit: Payer: Self-pay | Admitting: Student

## 2023-12-29 DIAGNOSIS — I69351 Hemiplegia and hemiparesis following cerebral infarction affecting right dominant side: Secondary | ICD-10-CM | POA: Diagnosis not present

## 2023-12-29 DIAGNOSIS — E1122 Type 2 diabetes mellitus with diabetic chronic kidney disease: Secondary | ICD-10-CM | POA: Diagnosis not present

## 2023-12-29 DIAGNOSIS — J449 Chronic obstructive pulmonary disease, unspecified: Secondary | ICD-10-CM | POA: Diagnosis not present

## 2023-12-29 DIAGNOSIS — N183 Chronic kidney disease, stage 3 unspecified: Secondary | ICD-10-CM | POA: Diagnosis not present

## 2023-12-29 DIAGNOSIS — I69392 Facial weakness following cerebral infarction: Secondary | ICD-10-CM | POA: Diagnosis not present

## 2023-12-29 DIAGNOSIS — I69322 Dysarthria following cerebral infarction: Secondary | ICD-10-CM | POA: Diagnosis not present

## 2023-12-29 DIAGNOSIS — I130 Hypertensive heart and chronic kidney disease with heart failure and stage 1 through stage 4 chronic kidney disease, or unspecified chronic kidney disease: Secondary | ICD-10-CM | POA: Diagnosis not present

## 2023-12-29 DIAGNOSIS — E1151 Type 2 diabetes mellitus with diabetic peripheral angiopathy without gangrene: Secondary | ICD-10-CM | POA: Diagnosis not present

## 2023-12-29 DIAGNOSIS — I5022 Chronic systolic (congestive) heart failure: Secondary | ICD-10-CM | POA: Diagnosis not present

## 2023-12-31 ENCOUNTER — Telehealth: Payer: Self-pay

## 2023-12-31 DIAGNOSIS — J449 Chronic obstructive pulmonary disease, unspecified: Secondary | ICD-10-CM | POA: Diagnosis not present

## 2023-12-31 DIAGNOSIS — I69322 Dysarthria following cerebral infarction: Secondary | ICD-10-CM | POA: Diagnosis not present

## 2023-12-31 DIAGNOSIS — I69392 Facial weakness following cerebral infarction: Secondary | ICD-10-CM | POA: Diagnosis not present

## 2023-12-31 DIAGNOSIS — E1122 Type 2 diabetes mellitus with diabetic chronic kidney disease: Secondary | ICD-10-CM | POA: Diagnosis not present

## 2023-12-31 DIAGNOSIS — N183 Chronic kidney disease, stage 3 unspecified: Secondary | ICD-10-CM | POA: Diagnosis not present

## 2023-12-31 DIAGNOSIS — I69351 Hemiplegia and hemiparesis following cerebral infarction affecting right dominant side: Secondary | ICD-10-CM | POA: Diagnosis not present

## 2023-12-31 DIAGNOSIS — I130 Hypertensive heart and chronic kidney disease with heart failure and stage 1 through stage 4 chronic kidney disease, or unspecified chronic kidney disease: Secondary | ICD-10-CM | POA: Diagnosis not present

## 2023-12-31 DIAGNOSIS — E1151 Type 2 diabetes mellitus with diabetic peripheral angiopathy without gangrene: Secondary | ICD-10-CM | POA: Diagnosis not present

## 2023-12-31 DIAGNOSIS — I5022 Chronic systolic (congestive) heart failure: Secondary | ICD-10-CM | POA: Diagnosis not present

## 2023-12-31 NOTE — Telephone Encounter (Signed)
 Patients wife calls nurse line (with home health nurse) in regards to [place].   She reports she has been giving [place] 5 units in the morning, however on recent discharge paper it states to give in the evening.   She reports she feels more comfortable and "in the habit" of giving it to him in the mornings. She would prefer to continue to give in the mornings.   Will forward to PCP for advisement.

## 2024-01-01 ENCOUNTER — Encounter: Payer: Self-pay | Admitting: Cardiology

## 2024-01-01 DIAGNOSIS — I5022 Chronic systolic (congestive) heart failure: Secondary | ICD-10-CM | POA: Diagnosis not present

## 2024-01-01 DIAGNOSIS — E1151 Type 2 diabetes mellitus with diabetic peripheral angiopathy without gangrene: Secondary | ICD-10-CM | POA: Diagnosis not present

## 2024-01-01 DIAGNOSIS — I69392 Facial weakness following cerebral infarction: Secondary | ICD-10-CM | POA: Diagnosis not present

## 2024-01-01 DIAGNOSIS — I69322 Dysarthria following cerebral infarction: Secondary | ICD-10-CM | POA: Diagnosis not present

## 2024-01-01 DIAGNOSIS — E1122 Type 2 diabetes mellitus with diabetic chronic kidney disease: Secondary | ICD-10-CM | POA: Diagnosis not present

## 2024-01-01 DIAGNOSIS — I69351 Hemiplegia and hemiparesis following cerebral infarction affecting right dominant side: Secondary | ICD-10-CM | POA: Diagnosis not present

## 2024-01-01 DIAGNOSIS — I130 Hypertensive heart and chronic kidney disease with heart failure and stage 1 through stage 4 chronic kidney disease, or unspecified chronic kidney disease: Secondary | ICD-10-CM | POA: Diagnosis not present

## 2024-01-01 DIAGNOSIS — N183 Chronic kidney disease, stage 3 unspecified: Secondary | ICD-10-CM | POA: Diagnosis not present

## 2024-01-01 DIAGNOSIS — J449 Chronic obstructive pulmonary disease, unspecified: Secondary | ICD-10-CM | POA: Diagnosis not present

## 2024-01-03 ENCOUNTER — Encounter: Payer: Self-pay | Admitting: Student

## 2024-01-03 ENCOUNTER — Other Ambulatory Visit: Payer: Self-pay

## 2024-01-03 ENCOUNTER — Other Ambulatory Visit: Payer: Self-pay | Admitting: Student

## 2024-01-03 DIAGNOSIS — I69392 Facial weakness following cerebral infarction: Secondary | ICD-10-CM | POA: Diagnosis not present

## 2024-01-03 DIAGNOSIS — J449 Chronic obstructive pulmonary disease, unspecified: Secondary | ICD-10-CM | POA: Diagnosis not present

## 2024-01-03 DIAGNOSIS — N183 Chronic kidney disease, stage 3 unspecified: Secondary | ICD-10-CM | POA: Diagnosis not present

## 2024-01-03 DIAGNOSIS — I130 Hypertensive heart and chronic kidney disease with heart failure and stage 1 through stage 4 chronic kidney disease, or unspecified chronic kidney disease: Secondary | ICD-10-CM | POA: Diagnosis not present

## 2024-01-03 DIAGNOSIS — L8990 Pressure ulcer of unspecified site, unspecified stage: Secondary | ICD-10-CM

## 2024-01-03 DIAGNOSIS — E1151 Type 2 diabetes mellitus with diabetic peripheral angiopathy without gangrene: Secondary | ICD-10-CM | POA: Diagnosis not present

## 2024-01-03 DIAGNOSIS — I5022 Chronic systolic (congestive) heart failure: Secondary | ICD-10-CM | POA: Diagnosis not present

## 2024-01-03 DIAGNOSIS — E1122 Type 2 diabetes mellitus with diabetic chronic kidney disease: Secondary | ICD-10-CM | POA: Diagnosis not present

## 2024-01-03 DIAGNOSIS — I69351 Hemiplegia and hemiparesis following cerebral infarction affecting right dominant side: Secondary | ICD-10-CM | POA: Diagnosis not present

## 2024-01-03 DIAGNOSIS — I69322 Dysarthria following cerebral infarction: Secondary | ICD-10-CM | POA: Diagnosis not present

## 2024-01-03 MED ORDER — GABAPENTIN 600 MG PO TABS: 600.0000 mg | ORAL_TABLET | Freq: Every day | ORAL | 0 refills | Status: DC

## 2024-01-03 MED ORDER — MIRTAZAPINE 7.5 MG PO TABS: 7.5000 mg | ORAL_TABLET | Freq: Every day | ORAL | 0 refills | Status: DC

## 2024-01-03 NOTE — Telephone Encounter (Signed)
 Spoke with patients wife. She wanted to know with the Novolog should she keep him on the sliding scale or can she go back to the 5 units? She also wanted to know with the Evaristo Bury  if she can also start him back on the 14 units giving it in the mornings? Aquilla Solian, CMA

## 2024-01-04 ENCOUNTER — Encounter: Payer: Self-pay | Admitting: Vascular Surgery

## 2024-01-04 DIAGNOSIS — I69322 Dysarthria following cerebral infarction: Secondary | ICD-10-CM | POA: Diagnosis not present

## 2024-01-04 DIAGNOSIS — I69392 Facial weakness following cerebral infarction: Secondary | ICD-10-CM | POA: Diagnosis not present

## 2024-01-04 DIAGNOSIS — E1122 Type 2 diabetes mellitus with diabetic chronic kidney disease: Secondary | ICD-10-CM | POA: Diagnosis not present

## 2024-01-04 DIAGNOSIS — I5022 Chronic systolic (congestive) heart failure: Secondary | ICD-10-CM | POA: Diagnosis not present

## 2024-01-04 DIAGNOSIS — N183 Chronic kidney disease, stage 3 unspecified: Secondary | ICD-10-CM | POA: Diagnosis not present

## 2024-01-04 DIAGNOSIS — J449 Chronic obstructive pulmonary disease, unspecified: Secondary | ICD-10-CM | POA: Diagnosis not present

## 2024-01-04 DIAGNOSIS — E1151 Type 2 diabetes mellitus with diabetic peripheral angiopathy without gangrene: Secondary | ICD-10-CM | POA: Diagnosis not present

## 2024-01-04 DIAGNOSIS — I69351 Hemiplegia and hemiparesis following cerebral infarction affecting right dominant side: Secondary | ICD-10-CM | POA: Diagnosis not present

## 2024-01-04 DIAGNOSIS — I130 Hypertensive heart and chronic kidney disease with heart failure and stage 1 through stage 4 chronic kidney disease, or unspecified chronic kidney disease: Secondary | ICD-10-CM | POA: Diagnosis not present

## 2024-01-05 ENCOUNTER — Encounter (HOSPITAL_COMMUNITY): Payer: Self-pay

## 2024-01-05 ENCOUNTER — Emergency Department (HOSPITAL_COMMUNITY)

## 2024-01-05 ENCOUNTER — Other Ambulatory Visit: Payer: Self-pay

## 2024-01-05 ENCOUNTER — Observation Stay (HOSPITAL_COMMUNITY)
Admission: EM | Admit: 2024-01-05 | Discharge: 2024-01-06 | Disposition: A | Discharge status: Home / Self Care | Attending: Internal Medicine | Admitting: Internal Medicine

## 2024-01-05 DIAGNOSIS — N1831 Chronic kidney disease, stage 3a: Secondary | ICD-10-CM | POA: Diagnosis not present

## 2024-01-05 DIAGNOSIS — I5042 Chronic combined systolic (congestive) and diastolic (congestive) heart failure: Secondary | ICD-10-CM | POA: Diagnosis not present

## 2024-01-05 DIAGNOSIS — Z87891 Personal history of nicotine dependence: Secondary | ICD-10-CM | POA: Insufficient documentation

## 2024-01-05 DIAGNOSIS — I739 Peripheral vascular disease, unspecified: Secondary | ICD-10-CM | POA: Diagnosis present

## 2024-01-05 DIAGNOSIS — I69351 Hemiplegia and hemiparesis following cerebral infarction affecting right dominant side: Secondary | ICD-10-CM | POA: Diagnosis not present

## 2024-01-05 DIAGNOSIS — E1151 Type 2 diabetes mellitus with diabetic peripheral angiopathy without gangrene: Secondary | ICD-10-CM | POA: Diagnosis not present

## 2024-01-05 DIAGNOSIS — Z8616 Personal history of COVID-19: Secondary | ICD-10-CM | POA: Insufficient documentation

## 2024-01-05 DIAGNOSIS — Z79899 Other long term (current) drug therapy: Secondary | ICD-10-CM | POA: Insufficient documentation

## 2024-01-05 DIAGNOSIS — I482 Chronic atrial fibrillation, unspecified: Secondary | ICD-10-CM | POA: Diagnosis present

## 2024-01-05 DIAGNOSIS — N183 Chronic kidney disease, stage 3 unspecified: Secondary | ICD-10-CM | POA: Diagnosis not present

## 2024-01-05 DIAGNOSIS — I5022 Chronic systolic (congestive) heart failure: Secondary | ICD-10-CM | POA: Diagnosis present

## 2024-01-05 DIAGNOSIS — I959 Hypotension, unspecified: Secondary | ICD-10-CM

## 2024-01-05 DIAGNOSIS — I2510 Atherosclerotic heart disease of native coronary artery without angina pectoris: Secondary | ICD-10-CM | POA: Diagnosis not present

## 2024-01-05 DIAGNOSIS — J449 Chronic obstructive pulmonary disease, unspecified: Secondary | ICD-10-CM | POA: Diagnosis not present

## 2024-01-05 DIAGNOSIS — I69322 Dysarthria following cerebral infarction: Secondary | ICD-10-CM | POA: Diagnosis not present

## 2024-01-05 DIAGNOSIS — J9612 Chronic respiratory failure with hypercapnia: Secondary | ICD-10-CM | POA: Diagnosis present

## 2024-01-05 DIAGNOSIS — J90 Pleural effusion, not elsewhere classified: Secondary | ICD-10-CM | POA: Diagnosis not present

## 2024-01-05 DIAGNOSIS — F32A Depression, unspecified: Secondary | ICD-10-CM | POA: Insufficient documentation

## 2024-01-05 DIAGNOSIS — R0602 Shortness of breath: Secondary | ICD-10-CM | POA: Diagnosis not present

## 2024-01-05 DIAGNOSIS — E1129 Type 2 diabetes mellitus with other diabetic kidney complication: Secondary | ICD-10-CM

## 2024-01-05 DIAGNOSIS — E1122 Type 2 diabetes mellitus with diabetic chronic kidney disease: Secondary | ICD-10-CM | POA: Insufficient documentation

## 2024-01-05 DIAGNOSIS — R404 Transient alteration of awareness: Secondary | ICD-10-CM | POA: Diagnosis not present

## 2024-01-05 DIAGNOSIS — Z8673 Personal history of transient ischemic attack (TIA), and cerebral infarction without residual deficits: Secondary | ICD-10-CM | POA: Insufficient documentation

## 2024-01-05 DIAGNOSIS — J9621 Acute and chronic respiratory failure with hypoxia: Secondary | ICD-10-CM | POA: Diagnosis not present

## 2024-01-05 DIAGNOSIS — J441 Chronic obstructive pulmonary disease with (acute) exacerbation: Principal | ICD-10-CM | POA: Insufficient documentation

## 2024-01-05 DIAGNOSIS — Z1152 Encounter for screening for COVID-19: Secondary | ICD-10-CM | POA: Diagnosis not present

## 2024-01-05 DIAGNOSIS — R531 Weakness: Secondary | ICD-10-CM | POA: Diagnosis not present

## 2024-01-05 DIAGNOSIS — Z794 Long term (current) use of insulin: Secondary | ICD-10-CM | POA: Insufficient documentation

## 2024-01-05 DIAGNOSIS — I69392 Facial weakness following cerebral infarction: Secondary | ICD-10-CM | POA: Diagnosis not present

## 2024-01-05 DIAGNOSIS — R918 Other nonspecific abnormal finding of lung field: Secondary | ICD-10-CM | POA: Diagnosis not present

## 2024-01-05 DIAGNOSIS — J439 Emphysema, unspecified: Secondary | ICD-10-CM | POA: Diagnosis not present

## 2024-01-05 DIAGNOSIS — E119 Type 2 diabetes mellitus without complications: Secondary | ICD-10-CM

## 2024-01-05 DIAGNOSIS — I130 Hypertensive heart and chronic kidney disease with heart failure and stage 1 through stage 4 chronic kidney disease, or unspecified chronic kidney disease: Secondary | ICD-10-CM | POA: Insufficient documentation

## 2024-01-05 DIAGNOSIS — J9601 Acute respiratory failure with hypoxia: Secondary | ICD-10-CM | POA: Diagnosis present

## 2024-01-05 DIAGNOSIS — R5383 Other fatigue: Secondary | ICD-10-CM | POA: Diagnosis present

## 2024-01-05 DIAGNOSIS — R231 Pallor: Secondary | ICD-10-CM | POA: Diagnosis not present

## 2024-01-05 HISTORY — DX: Chronic respiratory failure with hypercapnia: J96.12

## 2024-01-05 LAB — COMPREHENSIVE METABOLIC PANEL
ALT: 10 U/L (ref 0–44)
AST: 17 U/L (ref 15–41)
Albumin: 2.8 g/dL — ABNORMAL LOW (ref 3.5–5.0)
Alkaline Phosphatase: 46 U/L (ref 38–126)
Anion gap: 8 (ref 5–15)
BUN: 20 mg/dL (ref 8–23)
CO2: 25 mmol/L (ref 22–32)
Calcium: 8.2 mg/dL — ABNORMAL LOW (ref 8.9–10.3)
Chloride: 108 mmol/L (ref 98–111)
Creatinine, Ser: 1.45 mg/dL — ABNORMAL HIGH (ref 0.61–1.24)
GFR, Estimated: 52 mL/min — ABNORMAL LOW (ref >60)
Glucose, Bld: 66 mg/dL — ABNORMAL LOW (ref 70–99)
Potassium: 3.9 mmol/L (ref 3.5–5.1)
Sodium: 141 mmol/L (ref 135–145)
Total Bilirubin: 0.7 mg/dL (ref 0.0–1.2)
Total Protein: 5.8 g/dL — ABNORMAL LOW (ref 6.5–8.1)

## 2024-01-05 LAB — CBC WITH DIFFERENTIAL/PLATELET
Abs Immature Granulocytes: 0.06 10*3/uL (ref 0.00–0.07)
Basophils Absolute: 0 10*3/uL (ref 0.0–0.1)
Basophils Relative: 0 %
Eosinophils Absolute: 0.2 10*3/uL (ref 0.0–0.5)
Eosinophils Relative: 2 %
HCT: 34.9 % — ABNORMAL LOW (ref 39.0–52.0)
Hemoglobin: 10.7 g/dL — ABNORMAL LOW (ref 13.0–17.0)
Immature Granulocytes: 1 %
Lymphocytes Relative: 21 %
Lymphs Abs: 1.7 10*3/uL (ref 0.7–4.0)
MCH: 30.7 pg (ref 26.0–34.0)
MCHC: 30.7 g/dL (ref 30.0–36.0)
MCV: 100 fL (ref 80.0–100.0)
Monocytes Absolute: 0.7 10*3/uL (ref 0.1–1.0)
Monocytes Relative: 9 %
Neutro Abs: 5.4 10*3/uL (ref 1.7–7.7)
Neutrophils Relative %: 67 %
Platelets: 249 10*3/uL (ref 150–400)
RBC: 3.49 MIL/uL — ABNORMAL LOW (ref 4.22–5.81)
RDW: 19.2 % — ABNORMAL HIGH (ref 11.5–15.5)
WBC: 8.1 10*3/uL (ref 4.0–10.5)
nRBC: 0.5 % — ABNORMAL HIGH (ref 0.0–0.2)

## 2024-01-05 LAB — RESP PANEL BY RT-PCR (RSV, FLU A&B, COVID)  RVPGX2
Influenza A by PCR: NEGATIVE
Influenza B by PCR: NEGATIVE
Resp Syncytial Virus by PCR: NEGATIVE
SARS Coronavirus 2 by RT PCR: NEGATIVE

## 2024-01-05 LAB — BLOOD GAS, VENOUS
Acid-Base Excess: 3.9 mmol/L — ABNORMAL HIGH (ref 0.0–2.0)
Bicarbonate: 30.2 mmol/L — ABNORMAL HIGH (ref 20.0–28.0)
O2 Saturation: 39.4 %
Patient temperature: 37
pCO2, Ven: 51 mmHg (ref 44–60)
pH, Ven: 7.38 (ref 7.25–7.43)
pO2, Ven: <31 mmHg — CL (ref 32–45)

## 2024-01-05 LAB — TROPONIN I (HIGH SENSITIVITY): Troponin I (High Sensitivity): 14 ng/L (ref <18)

## 2024-01-05 LAB — CBG MONITORING, ED: Glucose-Capillary: 123 mg/dL — ABNORMAL HIGH (ref 70–99)

## 2024-01-05 MED ORDER — MAGNESIUM SULFATE 2 GM/50ML IV SOLN
2.0000 g | Freq: Once | INTRAVENOUS | Status: AC
  Administered 2024-01-05: 2 g via INTRAVENOUS
  Filled 2024-01-05: qty 50

## 2024-01-05 MED ORDER — MIRTAZAPINE 7.5 MG PO TABS
7.5000 mg | ORAL_TABLET | Freq: Every day | ORAL | Status: DC
  Administered 2024-01-05: 7.5 mg via ORAL
  Filled 2024-01-05 (×2): qty 1

## 2024-01-05 MED ORDER — INSULIN GLARGINE 100 UNIT/ML ~~LOC~~ SOLN
5.0000 [IU] | Freq: Every day | SUBCUTANEOUS | Status: DC
  Administered 2024-01-06: 5 [IU] via SUBCUTANEOUS
  Filled 2024-01-05: qty 0.05

## 2024-01-05 MED ORDER — INSULIN ASPART 100 UNIT/ML IJ SOLN
0.0000 [IU] | Freq: Every day | INTRAMUSCULAR | Status: DC
  Filled 2024-01-05: qty 0.05

## 2024-01-05 MED ORDER — LEVETIRACETAM 500 MG PO TABS
500.0000 mg | ORAL_TABLET | Freq: Two times a day (BID) | ORAL | Status: DC
  Administered 2024-01-05 – 2024-01-06 (×2): 500 mg via ORAL
  Filled 2024-01-05 (×2): qty 1

## 2024-01-05 MED ORDER — ACETAMINOPHEN 325 MG PO TABS: 650.0000 mg | ORAL_TABLET | Freq: Four times a day (QID) | ORAL | Status: DC | PRN

## 2024-01-05 MED ORDER — INSULIN ASPART 100 UNIT/ML IJ SOLN
0.0000 [IU] | Freq: Three times a day (TID) | INTRAMUSCULAR | Status: DC
  Administered 2024-01-06: 3 [IU] via SUBCUTANEOUS
  Administered 2024-01-06: 1 [IU] via SUBCUTANEOUS
  Filled 2024-01-05: qty 0.06

## 2024-01-05 MED ORDER — ROSUVASTATIN CALCIUM 20 MG PO TABS
10.0000 mg | ORAL_TABLET | Freq: Every day | ORAL | Status: DC
Start: 2024-01-05 — End: 2024-01-06
  Administered 2024-01-05: 10 mg via ORAL
  Filled 2024-01-05: qty 1

## 2024-01-05 MED ORDER — IPRATROPIUM-ALBUTEROL 0.5-2.5 (3) MG/3ML IN SOLN
3.0000 mL | RESPIRATORY_TRACT | Status: DC
  Administered 2024-01-05: 3 mL via RESPIRATORY_TRACT
  Filled 2024-01-05: qty 3

## 2024-01-05 MED ORDER — UMECLIDINIUM BROMIDE 62.5 MCG/ACT IN AEPB
1.0000 | INHALATION_SPRAY | Freq: Every day | RESPIRATORY_TRACT | Status: DC
  Administered 2024-01-06: 1 via RESPIRATORY_TRACT
  Filled 2024-01-05: qty 7

## 2024-01-05 MED ORDER — ALBUTEROL SULFATE (2.5 MG/3ML) 0.083% IN NEBU: 2.5000 mg | INHALATION_SOLUTION | RESPIRATORY_TRACT | Status: DC | PRN

## 2024-01-05 MED ORDER — POLYETHYLENE GLYCOL 3350 17 G PO PACK: 17.0000 g | PACK | Freq: Every day | ORAL | Status: DC | PRN

## 2024-01-05 MED ORDER — GABAPENTIN 300 MG PO CAPS
600.0000 mg | ORAL_CAPSULE | Freq: Every day | ORAL | Status: DC
  Administered 2024-01-05: 600 mg via ORAL
  Filled 2024-01-05: qty 2

## 2024-01-05 MED ORDER — SODIUM CHLORIDE 0.9% FLUSH
3.0000 mL | Freq: Two times a day (BID) | INTRAVENOUS | Status: DC
  Administered 2024-01-05 – 2024-01-06 (×2): 3 mL via INTRAVENOUS

## 2024-01-05 MED ORDER — METHYLPREDNISOLONE SODIUM SUCC 125 MG IJ SOLR
125.0000 mg | Freq: Once | INTRAMUSCULAR | Status: AC
  Administered 2024-01-05: 125 mg via INTRAVENOUS
  Filled 2024-01-05: qty 2

## 2024-01-05 MED ORDER — HYDRALAZINE HCL 25 MG PO TABS
25.0000 mg | ORAL_TABLET | Freq: Three times a day (TID) | ORAL | Status: DC
  Administered 2024-01-06 (×2): 25 mg via ORAL
  Filled 2024-01-05 (×2): qty 1

## 2024-01-05 MED ORDER — FLUTICASONE FUROATE-VILANTEROL 200-25 MCG/ACT IN AEPB
1.0000 | INHALATION_SPRAY | Freq: Every day | RESPIRATORY_TRACT | Status: DC
  Administered 2024-01-06: 1 via RESPIRATORY_TRACT
  Filled 2024-01-05: qty 28

## 2024-01-05 MED ORDER — APIXABAN 5 MG PO TABS
5.0000 mg | ORAL_TABLET | Freq: Two times a day (BID) | ORAL | Status: DC
  Administered 2024-01-05 – 2024-01-06 (×2): 5 mg via ORAL
  Filled 2024-01-05 (×2): qty 1

## 2024-01-05 MED ORDER — IPRATROPIUM BROMIDE 0.02 % IN SOLN
1.0000 mg | Freq: Once | RESPIRATORY_TRACT | Status: AC
  Administered 2024-01-05: 1 mg via RESPIRATORY_TRACT
  Filled 2024-01-05: qty 5

## 2024-01-05 MED ORDER — PANTOPRAZOLE SODIUM 40 MG PO TBEC
40.0000 mg | DELAYED_RELEASE_TABLET | Freq: Every day | ORAL | Status: DC
  Administered 2024-01-06: 40 mg via ORAL
  Filled 2024-01-05: qty 1

## 2024-01-05 MED ORDER — INSULIN DEGLUDEC 100 UNIT/ML ~~LOC~~ SOPN: 5.0000 [IU] | PEN_INJECTOR | Freq: Every morning | SUBCUTANEOUS | Status: DC

## 2024-01-05 MED ORDER — SODIUM CHLORIDE 0.9 % IV BOLUS
1000.0000 mL | Freq: Once | INTRAVENOUS | Status: AC
  Administered 2024-01-05: 1000 mL via INTRAVENOUS

## 2024-01-05 MED ORDER — ISOSORBIDE MONONITRATE ER 30 MG PO TB24
30.0000 mg | ORAL_TABLET | Freq: Every day | ORAL | Status: DC
  Administered 2024-01-06: 30 mg via ORAL
  Filled 2024-01-05: qty 1

## 2024-01-05 MED ORDER — ALBUTEROL SULFATE (2.5 MG/3ML) 0.083% IN NEBU
2.5000 mg | INHALATION_SOLUTION | Freq: Once | RESPIRATORY_TRACT | Status: AC
  Administered 2024-01-05: 2.5 mg via RESPIRATORY_TRACT
  Filled 2024-01-05: qty 3

## 2024-01-05 MED ORDER — IOHEXOL 350 MG/ML SOLN
75.0000 mL | Freq: Once | INTRAVENOUS | Status: AC | PRN
  Administered 2024-01-05: 75 mL via INTRAVENOUS

## 2024-01-05 MED ORDER — IPRATROPIUM-ALBUTEROL 0.5-2.5 (3) MG/3ML IN SOLN
3.0000 mL | Freq: Three times a day (TID) | RESPIRATORY_TRACT | Status: DC
  Administered 2024-01-06 (×2): 3 mL via RESPIRATORY_TRACT
  Filled 2024-01-05 (×2): qty 3

## 2024-01-05 MED ORDER — ACETAMINOPHEN 650 MG RE SUPP: 650.0000 mg | Freq: Four times a day (QID) | RECTAL | Status: DC | PRN

## 2024-01-05 NOTE — ED Provider Notes (Signed)
 [place] EMERGENCY DEPARTMENT AT [facility] Provider Note   CSN: [id number] Arrival date & time: 01/05/24  1440     History  Chief Complaint  Patient presents with   Fatigue   Hypoxia   Hypotension    Kyle [name] is a 73 y.o. male history of hypertension, heart failure, COPD, A-fib on Eliquis (unclear of his compliance), who presented with hypotension and shortness of breath.  Patient's called EMS since he is short of breath.  Patient states that he is feeling fine.  On EMS arrival, patient was noted to be tachypneic so put on nonrebreather.  They then titrated down to 6 L when he got here.  Patient is at baseline using 4 to 5 L.  Patient was noted to be hypotensive and 350 cc were given prior to arrival.  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  Patient was recently admitted for COPD exacerbation  The history is provided by the patient.       Home Medications Prior to Admission medications   Medication Sig Start Date End Date Taking? Authorizing Provider  acetaminophen (TYLENOL) 325 MG tablet Take 2 tablets (650 mg total) by mouth every 4 (four) hours as needed for mild pain (pain score 1-3). 09/27/23   Love, Evlyn Kanner, PA-C  albuterol (PROVENTIL) (2.5 MG/3ML) 0.083% nebulizer solution Take 3 mLs (2.5 mg total) by nebulization every 4 (four) hours as needed for wheezing or shortness of breath. 12/08/23   Carollee Herter, DO  apixaban (ELIQUIS) 5 MG TABS tablet TAKE 1 TABLET TWICE DAILY Patient taking differently: Take 5 mg by mouth in the morning and at bedtime. 11/17/23   [place], Peter M, MD  bisacodyl (DULCOLAX) 10 MG suppository Place 10 mg rectally daily as needed (for constipation).    [provider]  bisacodyl (DULCOLAX) 5 MG EC tablet Take 1 tablet (5 mg total) by mouth daily. 10/06/23   Love, Evlyn Kanner, PA-C  carvedilol (COREG) 6.25 MG tablet TAKE 1 TABLET TWICE DAILY WITH MEALS (NEED MD APPOINTMENT) 12/20/23   [place], Peter M, MD   cloNIDine (CATAPRES) 0.2 MG tablet Take 1 tablet (0.2 mg total) by mouth every 8 (eight) hours. Patient taking differently: Take 0.2 mg by mouth See admin instructions. Take 0.2 mg by mouth at 9 AM, 2 PM, and 9 PM 09/10/23   Erick Alley, DO  diclofenac Sodium (VOLTAREN) 1 % GEL Apply 2 g topically 4 (four) times daily. Patient taking differently: Apply 2 g topically See admin instructions. Apply to the right hip area 3 times a day for pain- 9 AM, 2 PM, and 9 PM 10/05/23   Love, Evlyn Kanner, PA-C  DROPLET PEN NEEDLES 32G X 4 MM MISC USE AS DIRECTED WITH INSULIN 01/03/24   Levin Erp, MD  Fluticasone-Umeclidin-Vilant (TRELEGY ELLIPTA) 200-62.5-25 MCG/ACT AEPB INHALE 1 PUFF EVERY DAY Patient taking differently: Inhale 1 puff into the lungs in the morning. 11/15/23   Oretha Milch, MD  gabapentin (NEURONTIN) 600 MG tablet Take 1 tablet (600 mg total) by mouth at bedtime. 01/03/24   Levin Erp, MD  guaifenesin (ROBITUSSIN) 100 MG/5ML syrup Take 200 mg by mouth in the morning and at bedtime.    [provider]  hydrALAZINE (APRESOLINE) 100 MG tablet Take 1 tablet (100 mg total) by mouth 3 (three) times daily. 09/10/23   Erick Alley, DO  insulin aspart (NOVOLOG) 100 UNIT/ML injection Inject 0-10 Units into the skin See admin instructions. Inject 0-10 units into the skin  three times a day before meals and at bedtime, per Sliding Scale: BGL 0-59 OR 400-1,000 = CALL MD; 60-150 = give nothing; 151-199 = 2 units; 200-249 = 4 units; 250-299 = 6 units; 300-349 = 8 units; 350-399 = 10 units    [provider]  insulin degludec (TRESIBA) 100 UNIT/ML FlexTouch Pen Inject 5 Units into the skin in the morning.    [provider]  ipratropium-albuterol (DUONEB) 0.5-2.5 (3) MG/3ML SOLN Take 3 mLs by nebulization in the morning, at noon, and at bedtime. 12/08/23 01/07/24  Carollee Herter, DO  isosorbide mononitrate (IMDUR) 30 MG 24 hr tablet TAKE 1 TABLET EVERY DAY 12/20/23   [place], Peter M, MD   levETIRAcetam (KEPPRA) 500 MG tablet TAKE 1 TABLET EVERY DAY 12/30/23   Levin Erp, MD  mirtazapine (REMERON) 7.5 MG tablet Take 1 tablet (7.5 mg total) by mouth at bedtime. 01/03/24   Levin Erp, MD  Multiple Vitamin (MULTIVITAMIN) tablet Take 1 tablet by mouth daily with breakfast.    [provider]  Nutritional Supplement LIQD Take 120 mLs by mouth 2 (two) times daily.    [provider]  OXYGEN Inhale 4 L/min into the lungs continuous.    [provider]  pantoprazole (PROTONIX) 40 MG tablet Take 1 tablet (40 mg total) by mouth daily. Patient taking differently: Take 40 mg by mouth in the morning. 10/06/23   Love, Evlyn Kanner, PA-C  polyethylene glycol (MIRALAX / GLYCOLAX) 17 g packet Take 17 g by mouth 2 (two) times daily.    [provider]  rosuvastatin (CRESTOR) 10 MG tablet TAKE 1 TABLET AT BEDTIME 08/30/23   Maeola Harman, MD  senna-docusate (SENOKOT-S) 8.6-50 MG tablet Take 3 tablets by mouth 2 (two) times daily. 10/05/23   Jacquelynn Cree, PA-C      Allergies    Patient has no known allergies.    Review of Systems   Review of Systems  Respiratory:  Positive for shortness of breath.   All other systems reviewed and are negative.   Physical Exam Updated Vital Signs BP 122/71   Pulse (!) 49   Temp 97.6 F (36.4 C) (Oral)   Resp 11   SpO2 97%  Physical Exam Vitals and nursing note reviewed.  Constitutional:      Comments: Tachypneic and chronically ill  HENT:     Head: Normocephalic.     Nose: Nose normal.     Mouth/Throat:     Mouth: Mucous membranes are moist.  Eyes:     Extraocular Movements: Extraocular movements intact.     Pupils: Pupils are equal, round, and reactive to light.  Cardiovascular:     Rate and Rhythm: Normal rate and regular rhythm.     Pulses: Normal pulses.  Pulmonary:     Comments: Tachypnea and mild diffuse wheezing.  No retractions Musculoskeletal:        General: Normal range of  motion.     Cervical back: Normal range of motion and neck supple.  Skin:    General: Skin is warm.     Capillary Refill: Capillary refill takes less than 2 seconds.  Neurological:     Comments: L sided weakness (chronic)   Psychiatric:        Mood and Affect: Mood normal.        Behavior: Behavior normal.     ED Results / Procedures / Treatments   Labs (all labs ordered are listed, but only abnormal results are displayed) Labs  Reviewed  CBC WITH DIFFERENTIAL/PLATELET - Abnormal; Notable for the following components:      Result Value   RBC 3.49 (*)    Hemoglobin 10.7 (*)    HCT 34.9 (*)    RDW 19.2 (*)    nRBC 0.5 (*)    All other components within normal limits  COMPREHENSIVE METABOLIC PANEL - Abnormal; Notable for the following components:   Glucose, Bld 66 (*)    Creatinine, Ser 1.45 (*)    Calcium 8.2 (*)    Total Protein 5.8 (*)    Albumin 2.8 (*)    GFR, Estimated 52 (*)    All other components within normal limits  BLOOD GAS, VENOUS - Abnormal; Notable for the following components:   pO2, Ven <31 (*)    Bicarbonate 30.2 (*)    Acid-Base Excess 3.9 (*)    All other components within normal limits  RESP PANEL BY RT-PCR (RSV, FLU A&B, COVID)  [id number]  CBG MONITORING, ED    EKG EKG Interpretation Date/Time:  Wednesday January 05 2024 15:02:20 EDT Ventricular Rate:  61 PR Interval:  161 QRS Duration:  112 QT Interval:  480 QTC Calculation: 484 R Axis:   1  Text Interpretation: Sinus rhythm Abnormal R-wave progression, early transition Probable left ventricular hypertrophy Nonspecific T abnormalities, anterior leads Borderline prolonged QT interval No significant change since last tracing Confirmed by Richardean Canal [phone]) on 01/05/2024 4:30:29 PM  Radiology CT Angio Chest PE W and/or Wo Contrast Result Date: 01/05/2024 CLINICAL DATA:  Pulmonary embolism (PE) suspected, high prob Lethargy. EXAM: CT ANGIOGRAPHY CHEST WITH CONTRAST TECHNIQUE: Multidetector CT  imaging of the chest was performed using the standard protocol during bolus administration of intravenous contrast. Multiplanar CT image reconstructions and MIPs were obtained to evaluate the vascular anatomy. RADIATION DOSE REDUCTION: This exam was performed according to the departmental dose-optimization program which includes automated exposure control, adjustment of the mA and/or kV according to patient size and/or use of iterative reconstruction technique. CONTRAST:  75mL OMNIPAQUE IOHEXOL 350 MG/ML SOLN COMPARISON:  Radiograph earlier today. Chest CT 1 month ago 12/05/2023 FINDINGS: Cardiovascular: There are no filling defects within the pulmonary arteries to suggest pulmonary embolus. The heart is mildly enlarged. Aortic tortuosity and atherosclerosis. There are coronary artery calcifications. No pericardial effusion. Contrast refluxes into the hepatic veins and IVC. Mediastinum/Nodes: Shotty mediastinal and hilar lymph nodes, increased from prior exam. For example, subcarinal node measures 18 mm series 4, image 67. Right hilar node measures 14 mm series 4, image 64. Small amount of high-density material in the esophagus. No esophageal wall thickening. Lungs/Pleura: Moderate to advanced emphysema. Small bilateral pleural effusions. There is ill-defined opacity in the lung bases, similar to prior CT. No new airspace disease. No debris in the trachea or central airways. Upper Abdomen: Contrast refluxes into the hepatic veins and IVC. Layering gallstones. Musculoskeletal: There are no acute or suspicious osseous abnormalities. Review of the MIP images confirms the above findings. IMPRESSION: 1. No pulmonary embolus. 2. Mild cardiomegaly. Contrast refluxes into the hepatic veins and IVC suggesting right heart dysfunction. Small bilateral pleural effusions. 3. Ill-defined opacity in the lung bases, similar to prior CT, likely atelectasis or scarring. 4. Emphysema. 5. Aortic atherosclerosis and coronary artery  calcifications. 6. Cholelithiasis. Aortic Atherosclerosis (ICD10-I70.0) and Emphysema (ICD10-J43.9). Electronically Signed   By: Narda Rutherford M.D.   On: 01/05/2024 18:36   DG Chest Port 1 View Result Date: 01/05/2024 CLINICAL DATA:  Shortness of breath. EXAM: PORTABLE CHEST 1  VIEW COMPARISON:  Chest radiograph 12/04/2023.  Chest CT 12/05/2023 FINDINGS: Heart size upper normal. Unchanged mediastinal contours. Ill-defined opacity at the lung bases. No pneumothorax or pleural effusion. No pulmonary edema. Emphysematous changes were better demonstrated on prior CT. IMPRESSION: Ill-defined opacity at the lung bases, may represent atelectasis or pneumonia. Electronically Signed   By: Narda Rutherford M.D.   On: 01/05/2024 18:30    Procedures Procedures    CRITICAL CARE Performed by: Richardean Canal   Total critical care time: 39 minutes  Critical care time was exclusive of separately billable procedures and treating other patients.  Critical care was necessary to treat or prevent imminent or life-threatening deterioration.  Critical care was time spent personally by me on the following activities: development of treatment plan with patient and/or surrogate as well as nursing, discussions with consultants, evaluation of patient's response to treatment, examination of patient, obtaining history from patient or surrogate, ordering and performing treatments and interventions, ordering and review of laboratory studies, ordering and review of radiographic studies, pulse oximetry and re-evaluation of patient's condition.   Medications Ordered in ED Medications  albuterol (PROVENTIL) (2.5 MG/3ML) 0.083% nebulizer solution 2.5 mg (2.5 mg Nebulization Given 01/05/24 1545)  ipratropium (ATROVENT) nebulizer solution 1 mg (1 mg Nebulization Given 01/05/24 1545)  methylPREDNISolone sodium succinate (SOLU-MEDROL) 125 mg/2 mL injection 125 mg (125 mg Intravenous Given 01/05/24 1558)  magnesium sulfate IVPB 2 g 50  mL (0 g Intravenous Stopped 01/05/24 1657)  sodium chloride 0.9 % bolus 1,000 mL (0 mLs Intravenous Stopped 01/05/24 1657)  iohexol (OMNIPAQUE) 350 MG/ML injection 75 mL (75 mLs Intravenous Contrast Given 01/05/24 1647)    ED Course/ Medical Decision Making/ A&P                                 Medical Decision Making Kyle [name] is a 73 y.o. male here with shortness of breath and hypotension.  Likely COPD exacerbation versus pneumonia or PE.  Plan to get CBC and CMP and VBG and COVID and chest x-ray and CTA chest. Will give albuterol and solumedrol and magnesium. Likely will need admission.   7:04 PM I reviewed patient's labs and glucose is 66.  CTA showed no PE.  Chest x-ray showed possible pneumonia but that is not confirmed on CT.  He is also not febrile and his white count is normal so we will hold off antibiotics.  Patient is still slightly tachypneic despite continuous nebs.  Patient is on about 5 L right now.  He has severe COPD.  At this point patient will be admitted for COPD exacerbation.  Problems Addressed: COPD exacerbation (HCC): acute illness or injury  Amount and/or Complexity of Data Reviewed Labs: ordered. Decision-making details documented in ED Course. Radiology: ordered. ECG/medicine tests: ordered.  Risk Prescription drug management. Decision regarding hospitalization.    Final Clinical Impression(s) / ED Diagnoses Final diagnoses:  COPD exacerbation [facility])    Rx / DC Orders ED Discharge Orders     None         Charlynne Pander, MD 01/05/24 Windell Moment

## 2024-01-05 NOTE — H&P (Addendum)
 History and Physical    Patient: Kyle [name] DOB: August 23, 1951 DOA: 01/05/2024 DOS: the patient was seen and examined on 01/05/2024 PCP: Levin Erp, MD  Patient coming from: Home  Chief Complaint:  Chief Complaint  Patient presents with   Fatigue   Hypoxia   Hypotension   HPI: Kyle [name] is a 73 y.o. 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 [facility] was recnetly DC on  2/12/225 after admison for Acute on chronic respiratory failure with hypoxemia (HCC) copd exaerbatino  Patient was discharged to skilled nursing facility.  History from the patient is limited, although corroborated from the ER provider as well patient seems to have developed abrupt onset of shortness of breath today without any chest pain cough fever loss of consciousness or palpitation or increasing leg swelling.  EMS was called at patient's bedside patient was initially requiring more than 6 L/min of oxygen  as he was only 88% SpO2 on 6lpm o2.  Patient was placed on nonrebreather and went up to 96% SpO2 per report.  Other report from the EMS indicates that the patient was slightly altered, clammy and lethargic although maintaining his own airway.  Apparently patient was recently prescribed a new blood pressure medication and patient started taking the medication at home yesterday.  Here in the ER, per report from the ER provider patient was felt to have hard time breathing.  Patient received Solu-Medrol magnesium and DuoNeb.  Has been weaned down to 6 L/min of supplementary  oxygen.  Medical evaluation is sought.  Patient at this time reports no complaints, feels that his shortness of breath is back to his baseline.  Blood pressure in the ER has been within reason lowest diastolic blood pressure is 55 with a systolic between 595 31.  Patient at this time offers no complaints there is no report of patient having a fever rigors vomiting diarrhea.  No chest pain, couyhing, leg swelling or palptiation.     Review of Systems: As mentioned in the history of present illness. All other systems reviewed and are negative. Past Medical History:  Diagnosis Date   Aortic insufficiency    MODERATE WITH A BICUSPID AORTIC VAVLE   Arterial occlusive disease    MULTILEVEL   Atrial fibrillation (HCC)    CHF (congestive heart failure) (HCC)    Chronic bronchitis (HCC)    CKD (chronic kidney disease) stage 3, GFR 30-59 ml/min (HCC)    COPD (chronic obstructive pulmonary disease) (HCC)    Dilated cardiomyopathy (HCC)    WITH EJECTION FRACTION DOWN TO 20-25%--WITH CONGESTIVE HEART FAILURE   Edema    LOWER EXTREMETIES   Emphysema of lung (HCC)    Hemorrhagic stroke (HCC) 09/06/2023   right side defecit   Hypertension    ICH (intracerebral hemorrhage) (HCC) 09/10/2023   Insulin dependent type 2 diabetes mellitus (HCC)    Normal coronary arteries 2009   Orthopnea    Peripheral arterial disease (HCC)    Pulmonary hypertension (HCC)    Past Surgical History:  Procedure Laterality Date   ABDOMINAL AORTAGRAM N/A 11/05/2014   Procedure: ABDOMINAL Ronny Flurry;  Surgeon: Chuck Hint, MD;  Location: [facility] CATH  LAB;  Service: Cardiovascular;  Laterality: N/A;   ABDOMINAL AORTOGRAM W/LOWER EXTREMITY Bilateral 04/21/2019   Procedure: ABDOMINAL AORTOGRAM W/LOWER EXTREMITY;  Surgeon: Chuck Hint, MD;  Location: [facility] INVASIVE CV LAB;  Service: Cardiovascular;  Laterality: Bilateral;   ABDOMINAL AORTOGRAM W/LOWER EXTREMITY Right 05/30/2021   Procedure: ABDOMINAL AORTOGRAM  W/LOWER EXTREMITY;  Surgeon: Chuck Hint, MD;  Location: [facility] INVASIVE CV LAB;  Service: Cardiovascular;  Laterality: Right;   AORTA - BILATERAL FEMORAL ARTERY BYPASS GRAFT N/A 12/18/2014   Procedure: AORTOBIFEMORAL BYPASS GRAFT;  Surgeon: Chuck Hint, MD;  Location: [facility] OR;  Service: Vascular;  Laterality: N/A;   CARDIAC CATHETERIZATION  2009   Nl Cors, EF 20%   COLONOSCOPY     ENDARTERECTOMY FEMORAL Left 12/18/2014   Procedure: ENDARTERECTOMY FEMORAL;  Surgeon: Chuck Hint, MD;  Location: [facility] OR;  Service: Vascular;  Laterality: Left;   FALSE ANEURYSM REPAIR Right 04/24/2019   Procedure: REPAIR OF RIGHT FEMORAL ARTERY PSEUDOANEURYSM;  Surgeon: Larina Earthly, MD;  Location: [facility] OR;  Service: Vascular;  Laterality: Right;   FEMORAL-POPLITEAL BYPASS GRAFT  02/21/2011   right   FEMORAL-TIBIAL BYPASS GRAFT Right 04/24/2019   Procedure: REDO BYPASS GRAFT RIGHT FEMORAL-TIBIAL ARTERY USING LEFT LEG VEIN & HEMASHIELD GOLD 8mm GRAFT;  Surgeon: Larina Earthly, MD;  Location: [facility] OR;  Service: Vascular;  Laterality: Right;   IR FLUORO GUIDE CV LINE RIGHT  12/28/2019   IR REMOVAL TUN CV CATH W/O FL  01/11/2020   IR US GUIDE VASC ACCESS RIGHT  12/28/2019   OTHER SURGICAL HISTORY     POST RIGHT FEMOROPOPLITEAL BYPASS GRAFT   PERIPHERAL VASCULAR CATHETERIZATION  11/05/2014   Procedure: LOWER EXTREMITY ANGIOGRAPHY;  Surgeon: Chuck Hint, MD;  Location: [facility] CATH LAB;  Service: Cardiovascular;;   VEIN HARVEST Left 04/24/2019   Procedure: LEFT LEG SAPHENOUS VEIN HARVEST;  Surgeon: Larina Earthly, MD;  Location: [facility] OR;  Service: Vascular;  Laterality: Left;   Social History:  reports that he quit smoking about 5 years ago. His smoking use included cigarettes. He started smoking about 45 years ago. He has a 40 pack-year smoking history. He has never been exposed to tobacco smoke. He has never used smokeless tobacco. He reports that he does not drink alcohol and does not use drugs.  No Known  Allergies  Family History  Problem Relation Age of Onset   Diabetes Mother    Hyperlipidemia Sister    Hypertension Sister     Prior to Admission medications   Medication Sig Start Date End Date Taking? Authorizing Provider  acetaminophen (TYLENOL) 325 MG tablet Take 2 tablets (650 mg total) by mouth every 4 (four) hours as needed for mild pain (pain score 1-3). 09/27/23   Love, Evlyn Kanner, PA-C  albuterol (PROVENTIL) (2.5 MG/3ML) 0.083% nebulizer solution Take 3 mLs (2.5 mg total) by nebulization every 4 (four) hours as needed for wheezing or shortness of breath. 12/08/23   Carollee Herter, DO  apixaban (ELIQUIS) 5 MG TABS tablet TAKE 1 TABLET TWICE DAILY Patient taking differently: Take 5 mg by mouth in the morning and at bedtime. 11/17/23   [place], Peter M, MD  bisacodyl (DULCOLAX) 5 MG EC tablet Take 1 tablet (5 mg total) by mouth daily. 10/06/23   Love, Evlyn Kanner, PA-C  carvedilol (COREG) 6.25 MG tablet TAKE 1 TABLET TWICE DAILY WITH MEALS (NEED MD APPOINTMENT) 12/20/23   [place], Peter M, MD  cloNIDine (CATAPRES) 0.2 MG tablet Take 1 tablet (0.2 mg  total) by mouth every 8 (eight) hours. Patient taking differently: Take 0.2 mg by mouth See admin instructions. Take 0.2 mg by mouth at 9 AM, 2 PM, and 9 PM 09/10/23   Erick Alley, DO  diclofenac Sodium (VOLTAREN) 1 % GEL Apply 2 g topically 4 (four) times daily. Patient taking differently: Apply 2 g topically See admin instructions. Apply to the right hip area 3 times a day for pain- 9 AM, 2 PM, and 9 PM 10/05/23   Love, Evlyn Kanner, PA-C  DROPLET PEN NEEDLES 32G X 4 MM MISC USE AS DIRECTED WITH INSULIN 01/03/24   Levin Erp, MD  Fluticasone-Umeclidin-Vilant (TRELEGY ELLIPTA) 200-62.5-25 MCG/ACT AEPB INHALE 1 PUFF EVERY DAY Patient taking differently: Inhale 1 puff into the lungs in the morning. 11/15/23   Oretha Milch, MD  gabapentin (NEURONTIN) 600 MG tablet Take 1 tablet (600 mg total) by mouth at bedtime. 01/03/24   Levin Erp, MD   guaifenesin (ROBITUSSIN) 100 MG/5ML syrup Take 200 mg by mouth in the morning and at bedtime.    [provider]  hydrALAZINE (APRESOLINE) 100 MG tablet Take 1 tablet (100 mg total) by mouth 3 (three) times daily. 09/10/23   Erick Alley, DO  insulin aspart (NOVOLOG) 100 UNIT/ML injection Inject 0-10 Units into the skin See admin instructions. Inject 0-10 units into the skin three times a day before meals and at bedtime, per Sliding Scale: BGL 0-59 OR 400-1,000 = CALL MD; 60-150 = give nothing; 151-199 = 2 units; 200-249 = 4 units; 250-299 = 6 units; 300-349 = 8 units; 350-399 = 10 units    [provider]  insulin degludec (TRESIBA) 100 UNIT/ML FlexTouch Pen Inject 5 Units into the skin in the morning.    [provider]  ipratropium-albuterol (DUONEB) 0.5-2.5 (3) MG/3ML SOLN Take 3 mLs by nebulization in the morning, at noon, and at bedtime. 12/08/23 01/07/24  Carollee Herter, DO  isosorbide mononitrate (IMDUR) 30 MG 24 hr tablet TAKE 1 TABLET EVERY DAY 12/20/23   [place], Peter M, MD  levETIRAcetam (KEPPRA) 500 MG tablet TAKE 1 TABLET EVERY DAY 12/30/23   Levin Erp, MD  mirtazapine (REMERON) 7.5 MG tablet Take 1 tablet (7.5 mg total) by mouth at bedtime. 01/03/24   Levin Erp, MD  Multiple Vitamin (MULTIVITAMIN) tablet Take 1 tablet by mouth daily with breakfast.    [provider]  Nutritional Supplement LIQD Take 120 mLs by mouth 2 (two) times daily.    [provider]  OXYGEN Inhale 4 L/min into the lungs continuous.    [provider]  pantoprazole (PROTONIX) 40 MG tablet Take 1 tablet (40 mg total) by mouth daily. Patient taking differently: Take 40 mg by mouth in the morning. 10/06/23   Love, Evlyn Kanner, PA-C  polyethylene glycol (MIRALAX / GLYCOLAX) 17 g packet Take 17 g by mouth 2 (two) times daily.    [provider]  rosuvastatin (CRESTOR) 10 MG tablet TAKE 1 TABLET AT BEDTIME 08/30/23   Maeola Harman, MD   senna-docusate (SENOKOT-S) 8.6-50 MG tablet Take 3 tablets by mouth 2 (two) times daily. 10/05/23   Jacquelynn Cree, PA-C    Physical Exam: Vitals:   01/05/24 1630 01/05/24 1806 01/05/24 1859 01/05/24 1900  BP: 120/62 (!) 131/55  122/71  Pulse: (!) 56 (!) 57  (!) 49  Resp: 13 13  11   Temp:   97.6 F (36.4 C)   TempSrc:   Oral   SpO2: 96% 97%  97%  General: Patient is alert and awake, however has slurred some speech and therefore is hard to understand fully.  But patient is coherent.  Patient is able to tell me that he typically uses 5 L/min of supplementary oxygen, he advises me is of his name date of birth.  Does have dysarthria. Respiratory exam: Bilateral intravesicular diminished diffusely expiratory wheezes are heard Cardiovascular exam S1-S2 normal Abdomen all quadrant soft nontender Extremities warm without edema moves all 4 extremity although there is some right-sided weakness. Data Reviewed:  Labs on Admission:  Results for orders placed or performed during the hospital encounter of 01/05/24 (from the past 24 hours)  CBC with Differential     Status: Abnormal   Collection Time: 01/05/24  3:36 PM  Result Value Ref Range   WBC 8.1 4.0 - 10.5 K/uL   RBC 3.49 (L) 4.22 - 5.81 MIL/uL   Hemoglobin 10.7 (L) 13.0 - 17.0 g/dL   HCT 60.4 (L) 54.0 - 98.1 %   MCV 100.0 80.0 - 100.0 fL   MCH 30.7 26.0 - 34.0 pg   MCHC 30.7 30.0 - 36.0 g/dL   RDW 19.1 (H) 47.8 - 29.5 %   Platelets 249 150 - 400 K/uL   nRBC 0.5 (H) 0.0 - 0.2 %   Neutrophils Relative % 67 %   Neutro Abs 5.4 1.7 - 7.7 K/uL   Lymphocytes Relative 21 %   Lymphs Abs 1.7 0.7 - 4.0 K/uL   Monocytes Relative 9 %   Monocytes Absolute 0.7 0.1 - 1.0 K/uL   Eosinophils Relative 2 %   Eosinophils Absolute 0.2 0.0 - 0.5 K/uL   Basophils Relative 0 %   Basophils Absolute 0.0 0.0 - 0.1 K/uL   Immature Granulocytes 1 %   Abs Immature Granulocytes 0.06 0.00 - 0.07 K/uL  Comprehensive metabolic panel     Status: Abnormal    Collection Time: 01/05/24  3:36 PM  Result Value Ref Range   Sodium 141 135 - 145 mmol/L   Potassium 3.9 3.5 - 5.1 mmol/L   Chloride 108 98 - 111 mmol/L   CO2 25 22 - 32 mmol/L   Glucose, Bld 66 (L) 70 - 99 mg/dL   BUN 20 8 - 23 mg/dL   Creatinine, Ser 6.21 (H) 0.61 - 1.24 mg/dL   Calcium 8.2 (L) 8.9 - 10.3 mg/dL   Total Protein 5.8 (L) 6.5 - 8.1 g/dL   Albumin 2.8 (L) 3.5 - 5.0 g/dL   AST 17 15 - 41 U/L   ALT 10 0 - 44 U/L   Alkaline Phosphatase 46 38 - 126 U/L   Total Bilirubin 0.7 0.0 - 1.2 mg/dL   GFR, Estimated 52 (L) >60 mL/min   Anion gap 8 5 - 15  Blood gas, venous (at [facility] and [facility])     Status: Abnormal   Collection Time: 01/05/24  3:36 PM  Result Value Ref Range   pH, Ven 7.38 7.25 - 7.43   pCO2, Ven 51 44 - 60 mmHg   pO2, Ven <31 (LL) 32 - 45 mmHg   Bicarbonate 30.2 (H) 20.0 - 28.0 mmol/L   Acid-Base Excess 3.9 (H) 0.0 - 2.0 mmol/L   O2 Saturation 39.4 %   Patient temperature 37.0   Resp panel by RT-PCR (RSV, Flu A&B, Covid) Anterior Nasal Swab     Status: None   Collection Time: 01/05/24  3:36 PM   Specimen: Anterior Nasal Swab  Result Value Ref Range   SARS Coronavirus 2 by  RT PCR NEGATIVE NEGATIVE   Influenza A by PCR NEGATIVE NEGATIVE   Influenza B by PCR NEGATIVE NEGATIVE   Resp Syncytial Virus by PCR NEGATIVE NEGATIVE   Basic Metabolic Panel: Recent Labs  Lab 01/05/24 1536  NA 141  K 3.9  CL 108  CO2 25  GLUCOSE 66*  BUN 20  CREATININE 1.45*  CALCIUM 8.2*   Liver Function Tests: Recent Labs  Lab 01/05/24 1536  AST 17  ALT 10  ALKPHOS 46  BILITOT 0.7  PROT 5.8*  ALBUMIN 2.8*   No results for input(s): "LIPASE", "AMYLASE" in the last 168 hours. No results for input(s): "AMMONIA" in the last 168 hours. CBC: Recent Labs  Lab 01/05/24 1536  WBC 8.1  NEUTROABS 5.4  HGB 10.7*  HCT 34.9*  MCV 100.0  PLT 249   Cardiac Enzymes: No results for input(s): "CKTOTAL", "CKMB", "CKMBINDEX", "TROPONINIHS" in the last 168 hours.  BNP (last 3  results) No results for input(s): "PROBNP" in the last 8760 hours. CBG: No results for input(s): "GLUCAP" in the last 168 hours.  Radiological Exams on Admission:  CT Angio Chest PE W and/or Wo Contrast Result Date: 01/05/2024 CLINICAL DATA:  Pulmonary embolism (PE) suspected, high prob Lethargy. EXAM: CT ANGIOGRAPHY CHEST WITH CONTRAST TECHNIQUE: Multidetector CT imaging of the chest was performed using the standard protocol during bolus administration of intravenous contrast. Multiplanar CT image reconstructions and MIPs were obtained to evaluate the vascular anatomy. RADIATION DOSE REDUCTION: This exam was performed according to the departmental dose-optimization program which includes automated exposure control, adjustment of the mA and/or kV according to patient size and/or use of iterative reconstruction technique. CONTRAST:  75mL OMNIPAQUE IOHEXOL 350 MG/ML SOLN COMPARISON:  Radiograph earlier today. Chest CT 1 month ago 12/05/2023 FINDINGS: Cardiovascular: There are no filling defects within the pulmonary arteries to suggest pulmonary embolus. The heart is mildly enlarged. Aortic tortuosity and atherosclerosis. There are coronary artery calcifications. No pericardial effusion. Contrast refluxes into the hepatic veins and IVC. Mediastinum/Nodes: Shotty mediastinal and hilar lymph nodes, increased from prior exam. For example, subcarinal node measures 18 mm series 4, image 67. Right hilar node measures 14 mm series 4, image 64. Small amount of high-density material in the esophagus. No esophageal wall thickening. Lungs/Pleura: Moderate to advanced emphysema. Small bilateral pleural effusions. There is ill-defined opacity in the lung bases, similar to prior CT. No new airspace disease. No debris in the trachea or central airways. Upper Abdomen: Contrast refluxes into the hepatic veins and IVC. Layering gallstones. Musculoskeletal: There are no acute or suspicious osseous abnormalities. Review of the MIP  images confirms the above findings. IMPRESSION: 1. No pulmonary embolus. 2. Mild cardiomegaly. Contrast refluxes into the hepatic veins and IVC suggesting right heart dysfunction. Small bilateral pleural effusions. 3. Ill-defined opacity in the lung bases, similar to prior CT, likely atelectasis or scarring. 4. Emphysema. 5. Aortic atherosclerosis and coronary artery calcifications. 6. Cholelithiasis. Aortic Atherosclerosis (ICD10-I70.0) and Emphysema (ICD10-J43.9). Electronically Signed   By: Narda Rutherford M.D.   On: 01/05/2024 18:36   DG Chest Port 1 View Result Date: 01/05/2024 CLINICAL DATA:  Shortness of breath. EXAM: PORTABLE CHEST 1 VIEW COMPARISON:  Chest radiograph 12/04/2023.  Chest CT 12/05/2023 FINDINGS: Heart size upper normal. Unchanged mediastinal contours. Ill-defined opacity at the lung bases. No pneumothorax or pleural effusion. No pulmonary edema. Emphysematous changes were better demonstrated on prior CT. IMPRESSION: Ill-defined opacity at the lung bases, may represent atelectasis or pneumonia. Electronically Signed   By: Shawna Orleans  Sanford M.D.   On: 01/05/2024 18:30    EKG: Independently reviewed. NSR no ST_T waves changes.  No intake/output data recorded. No intake/output data recorded.      Assessment and Plan: Acute on chronic respiratory failure with hypoxia (HCC) See history of presenting illness above.  Patient is felt to have had COPD exacerbation today.  But rather abrupt onset and I would say rather prompt resolution treatment thus far.  We will continue with DuoNebs.  Continue with inhaled corticosteroids.  I will defer further IV steroid therapy based on daily exam.  Patient is noted to have dependent lung opacification.  I will get a swallow evaluation to rule out silent aspiration.  Given that the CAT scan of the lung shows no PE and no new opacification, I will defer antibiotic therapy at this time.  Hypotension See HPI, review of EMS record indicates that the  patient's blood pressure was as low as systolic 70s.  Patient did receive 250 cc of fluid bolus and route to the ER.  Patient had poor response to fluid bolus.  But seems to have responded to hypoxemia resolution. Will work up like syncope. See echo below. CT PE protocol is negative.  I at this time I will cut down on patient's antihypertensive regimen.  I will check a troponin.  I noticed that the patient's heart rate is actually less than 60 here therefore I think Coreg would have to be cut down as well. I will monitor glucose as well  Patient had slight metabolic encephalopathy related to his hypotension and hypoxemia.  As patient is recorded as having EMS in the EMS sheet.  At this time his acute encephalopathy has resolved as patient is talking appropriately to me   Echo from 09/06/2023 shows severe aortic stenosis. I do not see any cardiology input in the chart since then. Current presentation could potentially be from Aortic stenosis. I have sent a staff message to patricia trent to please have cardio see in AM. Please folow up.  Med rec completed as follows:  HTN - c.w. imdur and hydralazine starting tomroow. Hold off on coreg and clonidine and amlodipine given soft bp and bradycardia. C/w pantop C.w keppra and gabapentin. I am not able to ascertain the indication for anti seizure meds Afib - c.w. apixaban.   Advance Care Planning:   Code Status: Prior patient wishes to be full code.  Consults: see above about cardiology consultation.  Family Communication: p erpatien.  Severity of Illness: The appropriate patient status for this patient is OBSERVATION. Observation status is judged to be reasonable and necessary in order to provide the required intensity of service to ensure the patient's safety. The patient's presenting symptoms, physical exam findings, and initial radiographic and laboratory data in the context of their medical condition is felt to place them at decreased risk for  further clinical deterioration. Furthermore, it is anticipated that the patient will be medically stable for discharge from the hospital within 2 midnights of admission.   Author: Nolberto Hanlon, MD 01/05/2024 7:26 PM  For on call review [email or web address].

## 2024-01-05 NOTE — ED Triage Notes (Signed)
 EMS reports from home, called out fo "sick". On arrival Pt lethargic since yesterday per wife. Pt 4.5 ltrs 24/7 EMS reports Hutto @ 6 Spo2 88 on arrival. Placed on nonrebreather up to 96, prior CVA with left sided deficit and slurred speech. Rx for Clonidine @ 0.2 provided BP greater than 120. Wife states she gave 0.2 this morning with a BP of 122, later home health nurse reported BP of 70/50.   BP 90/50 ( NS) HR 60 RR 14 Sp02 96 non rebreather CBG 128  18 LAC  NS

## 2024-01-05 NOTE — Telephone Encounter (Signed)
 Called patient after receiving an afterhour page.  The phone was EMS at patient's bedside.  MS initial assessment showed BP was 70/50, GCS was 8 with verbal response of 1.  Patient alert but nonverbal at this time which is not his usual baseline.  Report patient does appear clammy.  Cleared on clonidine for blood pressure and per family from last hospital discharge advised to hold clonidine if BP systolic less than 100.  Informed EMS will be appropriate to send to the ED for close monitoring.  Jerre Simon, MD PGY-3, [facility] Family Medicine Resident  Please page [phone] with questions.

## 2024-01-05 NOTE — Assessment & Plan Note (Addendum)
 See HPI, review of EMS record indicates that the patient's blood pressure was as low as systolic 70s.  Patient did receive 250 cc of fluid bolus and route to the ER.  Patient had poor response to fluid bolus.  But seems to have responded to hypoxemia resolution. Will work up like syncope. See echo below. CT PE protocol is negative.  I at this time I will cut down on patient's antihypertensive regimen.  I will check a troponin.  I noticed that the patient's heart rate is actually less than 60 here therefore I think Coreg would have to be cut down as well. I will monitor glucose as well  Patient had slight metabolic encephalopathy related to his hypotension and hypoxemia.  As patient is recorded as having EMS in the EMS sheet.  At this time his acute encephalopathy has resolved as patient is talking appropriately to me

## 2024-01-05 NOTE — Assessment & Plan Note (Signed)
 See history of presenting illness above.  Patient is felt to have had COPD exacerbation today.  But rather abrupt onset and I would say rather prompt resolution treatment thus far.  We will continue with DuoNebs.  Continue with inhaled corticosteroids.  I will defer further IV steroid therapy based on daily exam.  Patient is noted to have dependent lung opacification.  I will get a swallow evaluation to rule out silent aspiration.  Given that the CAT scan of the lung shows no PE and no new opacification, I will defer antibiotic therapy at this time.

## 2024-01-06 DIAGNOSIS — J441 Chronic obstructive pulmonary disease with (acute) exacerbation: Secondary | ICD-10-CM | POA: Diagnosis not present

## 2024-01-06 DIAGNOSIS — Z7401 Bed confinement status: Secondary | ICD-10-CM | POA: Diagnosis not present

## 2024-01-06 DIAGNOSIS — J9600 Acute respiratory failure, unspecified whether with hypoxia or hypercapnia: Secondary | ICD-10-CM | POA: Diagnosis not present

## 2024-01-06 LAB — CBC
HCT: 35.7 % — ABNORMAL LOW (ref 39.0–52.0)
Hemoglobin: 10.7 g/dL — ABNORMAL LOW (ref 13.0–17.0)
MCH: 30.1 pg (ref 26.0–34.0)
MCHC: 30 g/dL (ref 30.0–36.0)
MCV: 100.3 fL — ABNORMAL HIGH (ref 80.0–100.0)
Platelets: 285 10*3/uL (ref 150–400)
RBC: 3.56 MIL/uL — ABNORMAL LOW (ref 4.22–5.81)
RDW: 18.9 % — ABNORMAL HIGH (ref 11.5–15.5)
WBC: 5.4 10*3/uL (ref 4.0–10.5)
nRBC: 0 % (ref 0.0–0.2)

## 2024-01-06 LAB — PROTIME-INR
INR: 1.5 — ABNORMAL HIGH (ref 0.8–1.2)
Prothrombin Time: 18.5 s — ABNORMAL HIGH (ref 11.4–15.2)

## 2024-01-06 LAB — GLUCOSE, CAPILLARY
Glucose-Capillary: 251 mg/dL — ABNORMAL HIGH (ref 70–99)
Glucose-Capillary: 184 mg/dL — ABNORMAL HIGH (ref 70–99)
Glucose-Capillary: 173 mg/dL — ABNORMAL HIGH (ref 70–99)

## 2024-01-06 LAB — BASIC METABOLIC PANEL
Anion gap: 9 (ref 5–15)
BUN: 24 mg/dL — ABNORMAL HIGH (ref 8–23)
CO2: 23 mmol/L (ref 22–32)
Calcium: 8.3 mg/dL — ABNORMAL LOW (ref 8.9–10.3)
Chloride: 108 mmol/L (ref 98–111)
Creatinine, Ser: 1.37 mg/dL — ABNORMAL HIGH (ref 0.61–1.24)
GFR, Estimated: 55 mL/min — ABNORMAL LOW (ref >60)
Glucose, Bld: 205 mg/dL — ABNORMAL HIGH (ref 70–99)
Potassium: 4.6 mmol/L (ref 3.5–5.1)
Sodium: 140 mmol/L (ref 135–145)

## 2024-01-06 LAB — APTT: aPTT: 47 s — ABNORMAL HIGH (ref 24–36)

## 2024-01-06 NOTE — Progress Notes (Signed)
   01/06/24 1416  TOC Brief Assessment  Insurance and Status Reviewed  Patient has primary care physician Yes  Home environment has been reviewed Home with spouse  Prior level of function: Bedbound  Prior/Current Home Services No current home services  Social Drivers of Health Review SDOH reviewed no interventions necessary  Readmission risk has been reviewed Yes  Transition of care needs no transition of care needs at this time

## 2024-01-06 NOTE — Discharge Summary (Signed)
 Physician Discharge Summary  Kyle [name] NWG:[id number] DOB: [birth year] DOA: 01/05/2024  PCP: Levin Erp, MD  Admit date: 01/05/2024 Discharge date: 01/06/2024 Discharging to: home Recommendations for Outpatient Follow-up:  F/u with [facility] family practice teaching service  Consults:  None Procedures:  None   Discharge Diagnoses:   Principal Problem:   COPD exacerbation (HCC) Active Problems:   Acute on chronic respiratory failure with hypoxia (HCC)   Peripheral arterial disease (HCC)   Chronic systolic CHF (congestive heart failure) (HCC)  - prior echo 10-2018 showed LVEF 40%. 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  Apparently he was lethargic at home and pulse ox was noted to be 88% on 6 L. He was hypotensive, clammy and lethargic.   In the ED he was given albuterol, Solu-Medrol, Atrovent and IV magnesium.  His dyspnea improved soon after and hypoxia resolved but was admitted to the hospital for ongoing tachypnea.  Principal Problem: Acute on chronic respiratory failure with hypoxia -On evaluation today, he has no complaints of shortness of breath, coughing or wheezing -Pulse ox is 95% on 6 L -CTA revealed emphysema, atelectasis versus scarring, small bilateral pleural effusions and mild cardiomegaly with contracts refluxing into the hepatic veins and IVC suggesting right heart dysfunction. - He is euvolemic  Active Problems:  Hypotension - Patient was noted to be hypotensive down to 70/50 after his wife gave him a PRN Clonidine at home - was given a  fluid bolus which resulted in improvement  History of CVA with left-sided weakness - Bedbound at baseline - had hemorrhagic infarct in 2024  COPD -See above  Depression - The patient states that since his CVA, he is unable to get out of bed-he states he is severely depressed and "wants to die"-he declines the need for counseling or psychiatry assistance  Atrial fibrillation - Continue Eliquis       Discharge Instructions  Discharge Instructions     Diet - low sodium heart healthy   Complete by: As directed    Diet Carb Modified   Complete by: As directed    Increase activity slowly   Complete by: As directed       Allergies as of 01/06/2024   No Known Allergies      Medication List     TAKE these medications    acetaminophen 325 MG tablet Commonly known as: TYLENOL Take 2 tablets (650 mg total) by mouth every 4 (four) hours as needed for mild pain (pain score 1-3).   albuterol (2.5 MG/3ML) 0.083% nebulizer solution Commonly known as: PROVENTIL Take 3 mLs (2.5 mg total) by nebulization every 4 (four) hours as needed for wheezing or shortness of breath.   amLODipine 5 MG tablet Commonly known as: NORVASC Take 5 mg by mouth at bedtime.   bisacodyl 5 MG EC tablet Commonly known as: DULCOLAX Take 1 tablet (5 mg total) by mouth daily.   carvedilol 6.25 MG tablet Commonly known as: COREG TAKE 1 TABLET TWICE DAILY WITH MEALS (NEED MD APPOINTMENT) What changed: See the new instructions.   Centrum Men Tabs  Take 1 tablet by mouth daily with breakfast.   cloNIDine 0.2 MG tablet Commonly known as: CATAPRES Take 1 tablet (0.2 mg total) by mouth every 8 (eight) hours. What changed:  when to take this additional instructions   diclofenac Sodium 1 % Gel Commonly known as: VOLTAREN Apply 2 g topically 4 (four) times daily.   Droplet Pen Needles 32G X 4 MM Misc Generic drug: Insulin Pen Needle USE AS DIRECTED WITH INSULIN   Eliquis 5 MG Tabs tablet Generic  drug: apixaban TAKE 1 TABLET TWICE DAILY What changed: when to take this   gabapentin 600 MG tablet Commonly known as: NEURONTIN Take 1 tablet (600 mg total) by mouth at bedtime.   hydrALAZINE 25 MG tablet Commonly known as: APRESOLINE Take 25 mg by mouth in the morning, at noon, and at bedtime. What changed: Another medication with the same name was removed. Continue taking this medication, and follow the directions you see here.   ipratropium-albuterol 0.5-2.5 (3) MG/3ML Soln Commonly known as: DUONEB Take 3 mLs by nebulization in the morning, at noon, and at bedtime.   isosorbide mononitrate 30 MG 24 hr tablet Commonly known as: IMDUR TAKE 1 TABLET EVERY DAY   levETIRAcetam 500 MG tablet Commonly known as: KEPPRA TAKE 1 TABLET EVERY DAY What changed: when to take this   mirtazapine 7.5 MG tablet Commonly known as: REMERON Take 1 tablet (7.5 mg total) by mouth at bedtime.   Mucinex 600 MG 12 hr tablet Generic drug: guaiFENesin Take 600 mg by mouth 2 (two) times daily.   NovoLOG FlexPen 100 UNIT/ML FlexPen Generic drug: insulin aspart Inject 0-10 Units into the skin See admin instructions. Inject 0-10 units into the skin three times a day before meals, per Sliding Scale: BGL 0-59 OR 400-1,000 = CALL MD; 60-150 = give nothing; 151-199 = 2 units; 200-249 = 4 units; 250-299 = 6 units; 300-349 = 8 units; 350-399 = 10 units   OXYGEN Inhale 4 L/min into the lungs continuous.   pantoprazole 40 MG tablet Commonly known as: PROTONIX Take 1 tablet (40 mg total) by mouth daily. What changed: when to take this   polyethylene glycol 17 g packet Commonly known as: MIRALAX / GLYCOLAX Take 17 g by mouth 2 (two) times daily as needed for mild constipation or moderate constipation.   rosuvastatin 10 MG tablet Commonly known as: CRESTOR TAKE 1 TABLET AT BEDTIME   senna-docusate 8.6-50 MG tablet Commonly known as: Senokot-S Take 3 tablets by mouth 2 (two) times daily.   Trelegy  Ellipta 200-62.5-25 MCG/ACT Aepb Generic drug: Fluticasone-Umeclidin-Vilant INHALE 1 PUFF EVERY DAY What changed: See the new instructions.   Evaristo Bury FlexTouch 100 UNIT/ML FlexTouch Pen Generic drug: insulin degludec Inject 5 Units into the skin in the morning.            The results of significant diagnostics from this hospitalization (including imaging, microbiology, ancillary and laboratory) are listed below for reference.    CT Angio Chest PE W and/or Wo Contrast Result Date: 01/05/2024 CLINICAL DATA:  Pulmonary embolism (PE) suspected, high prob Lethargy. EXAM: CT ANGIOGRAPHY CHEST WITH CONTRAST TECHNIQUE: Multidetector CT imaging of the chest was performed using the standard protocol during bolus administration of intravenous contrast. Multiplanar CT image reconstructions and MIPs were obtained to evaluate the vascular anatomy. RADIATION DOSE REDUCTION: This exam was performed according to the departmental dose-optimization program which includes automated exposure control, adjustment of the mA and/or kV according to patient size and/or use of iterative reconstruction  technique. CONTRAST:  75mL OMNIPAQUE IOHEXOL 350 MG/ML SOLN COMPARISON:  Radiograph earlier today. Chest CT 1 month ago 12/05/2023 FINDINGS: Cardiovascular: There are no filling defects within the pulmonary arteries to suggest pulmonary embolus. The heart is mildly enlarged. Aortic tortuosity and atherosclerosis. There are coronary artery calcifications. No pericardial effusion. Contrast refluxes into the hepatic veins and IVC. Mediastinum/Nodes: Shotty mediastinal and hilar lymph nodes, increased from prior exam. For example, subcarinal node measures 18 mm series 4, image 67. Right hilar node measures 14 mm series 4, image 64. Small amount of high-density material in the esophagus. No esophageal wall thickening. Lungs/Pleura: Moderate to advanced emphysema. Small bilateral pleural effusions. There is ill-defined opacity in the  lung bases, similar to prior CT. No new airspace disease. No debris in the trachea or central airways. Upper Abdomen: Contrast refluxes into the hepatic veins and IVC. Layering gallstones. Musculoskeletal: There are no acute or suspicious osseous abnormalities. Review of the MIP images confirms the above findings. IMPRESSION: 1. No pulmonary embolus. 2. Mild cardiomegaly. Contrast refluxes into the hepatic veins and IVC suggesting right heart dysfunction. Small bilateral pleural effusions. 3. Ill-defined opacity in the lung bases, similar to prior CT, likely atelectasis or scarring. 4. Emphysema. 5. Aortic atherosclerosis and coronary artery calcifications. 6. Cholelithiasis. Aortic Atherosclerosis (ICD10-I70.0) and Emphysema (ICD10-J43.9). Electronically Signed   By: Narda Rutherford M.D.   On: 01/05/2024 18:36   DG Chest Port 1 View Result Date: 01/05/2024 CLINICAL DATA:  Shortness of breath. EXAM: PORTABLE CHEST 1 VIEW COMPARISON:  Chest radiograph 12/04/2023.  Chest CT 12/05/2023 FINDINGS: Heart size upper normal. Unchanged mediastinal contours. Ill-defined opacity at the lung bases. No pneumothorax or pleural effusion. No pulmonary edema. Emphysematous changes were better demonstrated on prior CT. IMPRESSION: Ill-defined opacity at the lung bases, may represent atelectasis or pneumonia. Electronically Signed   By: Narda Rutherford M.D.   On: 01/05/2024 18:30   Labs:   Basic Metabolic Panel: Recent Labs  Lab 01/05/24 1536 01/06/24 0656  NA 141 140  K 3.9 4.6  CL 108 108  CO2 25 23  GLUCOSE 66* 205*  BUN 20 24*  CREATININE 1.45* 1.37*  CALCIUM 8.2* 8.3*     CBC: Recent Labs  Lab 01/05/24 1536 01/06/24 0656  WBC 8.1 5.4  NEUTROABS 5.4  --   HGB 10.7* 10.7*  HCT 34.9* 35.7*  MCV 100.0 100.3*  PLT 249 285         SIGNED:   Calvert Cantor, MD  [facility] 01/06/2024, 12:54 PM

## 2024-01-06 NOTE — TOC Transition Note (Signed)
 Transition of Care [facility]) - Discharge Note   Patient Details  Name: Kyle [name] MRN: [id number] Date of Birth: 04/23/1951  Transition of Care [facility]) CM/SW Contact:  Beckie Busing, RN Phone Number:[phone]  01/06/2024, 2:17 PM   Clinical Narrative:    Transportation arranged via [facility]. D/c packet is at nurses station in chart.      Barriers to Discharge: No Barriers Identified   Patient Goals and CMS Choice            Discharge Placement                       Discharge Plan and Services Additional resources added to the After Visit Summary for                                       Social Drivers of Health (SDOH) Interventions SDOH Screenings   Food Insecurity: No Food Insecurity (01/06/2024)  Housing: Low Risk  (01/06/2024)  Transportation Needs: No Transportation Needs (01/06/2024)  Utilities: Not At Risk (01/06/2024)  Alcohol Screen: Low Risk  (03/05/2023)  Depression (PHQ2-9): Low Risk  (03/05/2023)  Financial Resource Strain: Low Risk  (03/05/2023)  Physical Activity: Sufficiently Active (03/05/2023)  Social Connections: Moderately Isolated (01/06/2024)  Stress: No Stress Concern Present (03/05/2023)  Tobacco Use: Medium Risk (01/05/2024)     Readmission Risk Interventions     No data to display

## 2024-01-06 NOTE — Progress Notes (Signed)
 Pt being dc'd at this time, if swallow evaluation is still needed, it could be conducted as OP.    Rolena Infante, MS [facility] SLP Acute [employer] [phone]

## 2024-01-07 ENCOUNTER — Telehealth: Payer: Self-pay

## 2024-01-07 ENCOUNTER — Telehealth: Payer: Self-pay | Admitting: Pulmonary Disease

## 2024-01-07 DIAGNOSIS — E1122 Type 2 diabetes mellitus with diabetic chronic kidney disease: Secondary | ICD-10-CM | POA: Diagnosis not present

## 2024-01-07 DIAGNOSIS — J9611 Chronic respiratory failure with hypoxia: Secondary | ICD-10-CM

## 2024-01-07 DIAGNOSIS — N183 Chronic kidney disease, stage 3 unspecified: Secondary | ICD-10-CM | POA: Diagnosis not present

## 2024-01-07 DIAGNOSIS — J449 Chronic obstructive pulmonary disease, unspecified: Secondary | ICD-10-CM | POA: Diagnosis not present

## 2024-01-07 DIAGNOSIS — I69322 Dysarthria following cerebral infarction: Secondary | ICD-10-CM | POA: Diagnosis not present

## 2024-01-07 DIAGNOSIS — I5022 Chronic systolic (congestive) heart failure: Secondary | ICD-10-CM | POA: Diagnosis not present

## 2024-01-07 DIAGNOSIS — I130 Hypertensive heart and chronic kidney disease with heart failure and stage 1 through stage 4 chronic kidney disease, or unspecified chronic kidney disease: Secondary | ICD-10-CM | POA: Diagnosis not present

## 2024-01-07 DIAGNOSIS — I69392 Facial weakness following cerebral infarction: Secondary | ICD-10-CM | POA: Diagnosis not present

## 2024-01-07 DIAGNOSIS — E1151 Type 2 diabetes mellitus with diabetic peripheral angiopathy without gangrene: Secondary | ICD-10-CM | POA: Diagnosis not present

## 2024-01-07 DIAGNOSIS — I69351 Hemiplegia and hemiparesis following cerebral infarction affecting right dominant side: Secondary | ICD-10-CM | POA: Diagnosis not present

## 2024-01-07 DIAGNOSIS — J439 Emphysema, unspecified: Secondary | ICD-10-CM

## 2024-01-07 NOTE — Telephone Encounter (Signed)
 Please advise on 02 increase pt has not been seen since 12/2022

## 2024-01-07 NOTE — Telephone Encounter (Signed)
 Received call from [place]- PTA with [facility] regarding medication interaction between clonidine and carvedilol.   Spoke with Dr. Laroy Apple. She advised that patient could hold clonidine and follow up with her at visit next week.   Called patient's wife to provide with update.   All questions answered.  Veronda Prude, RN

## 2024-01-07 NOTE — Transitions of Care (Post Inpatient/ED Visit) (Signed)
 01/07/2024  Name: Kyle [name] MRN: [id number] DOB: May 19, 1951  Today's TOC FU Call Status: Today's TOC FU Call Status:: Successful TOC FU Call Completed TOC FU Call Complete Date: 01/07/24 Patient's Name and Date of Birth confirmed.  Transition Care Management Follow-up Telephone Call Date of Discharge: 01/06/24 Discharge Facility: [facility]) Type of Discharge: Emergency Department Reason for ED Visit: Other:, Respiratory Respiratory Diagnosis: COPD Exacerbation How have you been since you were released from the hospital?: Better Any questions or concerns?: No  Items Reviewed: Did you receive and understand the discharge instructions provided?: Yes Medications obtained,verified, and reconciled?: Yes (Medications Reviewed) Any new allergies since your discharge?: No Dietary orders reviewed?: Yes Do you have support at home?: Yes People in Home: spouse  Medications Reviewed Today: Medications Reviewed Today     Reviewed by Karena Addison, LPN (Licensed Practical Nurse) on 01/07/24 at [phone]  Med List Status: <None>   Medication Order Taking? Sig Documenting Provider Last Dose Status Informant  acetaminophen (TYLENOL) 325 MG tablet [id number] No Take 2 tablets (650 mg total) by mouth every 4 (four) hours as needed for mild pain (pain score 1-3). Jacquelynn Cree, [place] 12/02/2023 Active Spouse/Significant Other  albuterol (PROVENTIL) (2.5 MG/3ML) 0.083% nebulizer solution [id number]  Take 3 mLs (2.5 mg total) by nebulization every 4 (four) hours as needed for wheezing or shortness of breath. Carollee Herter, DO  Active Spouse/Significant Other  amLODipine (NORVASC) 5 MG tablet [id number] No Take 5 mg by mouth at bedtime. [provider] 01/04/2024 Bedtime Active Spouse/Significant Other  apixaban (ELIQUIS) 5 MG TABS tablet [id number] No TAKE 1 TABLET TWICE DAILY  Patient taking differently: Take 5 mg by mouth in the morning and at bedtime.   [place], Peter M, MD 01/05/2024  8:05  AM Active Spouse/Significant Other  carvedilol (COREG) 6.25 MG tablet [id number] No TAKE 1 TABLET TWICE DAILY WITH MEALS (NEED MD APPOINTMENT)  Patient taking differently: Take 6.25 mg by mouth in the morning and at bedtime.   [place], Peter M, MD 01/05/2024  8:00 AM Active Spouse/Significant Other  cloNIDine (CATAPRES) 0.2 MG tablet [id number] No Take 1 tablet (0.2 mg total) by mouth every 8 (eight) hours.  Patient taking differently: Take 0.2 mg by mouth See admin instructions. Take 0.2 mg by mouth at 9 AM, 2 PM, and 9 PM   Erick Alley, DO 01/05/2024 Morning Active Spouse/Significant Other  DROPLET PEN NEEDLES 32G X 4 MM MISC [id number]  USE AS DIRECTED WITH INSULIN Levin Erp, MD  Active Spouse/Significant Other  Fluticasone-Umeclidin-Vilant (TRELEGY ELLIPTA) 200-62.5-25 MCG/ACT AEPB [id number] No INHALE 1 PUFF EVERY DAY  Patient taking differently: Inhale 1 puff into the lungs in the morning.   Oretha Milch, MD 01/05/2024 Morning Active Spouse/Significant Other  gabapentin (NEURONTIN) 600 MG tablet [id number] No Take 1 tablet (600 mg total) by mouth at bedtime. Levin Erp, MD 01/04/2024 Bedtime Active Spouse/Significant Other  hydrALAZINE (APRESOLINE) 25 MG tablet [id number] No Take 25 mg by mouth in the morning, at noon, and at bedtime. [provider] 01/05/2024 Noon Active Spouse/Significant Other  ipratropium-albuterol (DUONEB) 0.5-2.5 (3) MG/3ML SOLN [id number] No Take 3 mLs by nebulization in the morning, at noon, and at bedtime.  Patient not taking: Reported on 01/05/2024   Carollee Herter, DO Not Taking Active Spouse/Significant Other  isosorbide mononitrate (IMDUR) 30 MG 24 hr tablet [id number] No TAKE 1 TABLET EVERY DAY [place], Peter M, MD 01/05/2024 Active Spouse/Significant Other  levETIRAcetam (KEPPRA) 500 MG tablet [id number] No TAKE 1 TABLET  EVERY DAY  Patient taking differently: Take 500 mg by mouth 2 (two) times daily.   Levin Erp, MD 01/05/2024 Morning Active  Spouse/Significant Other  mirtazapine (REMERON) 7.5 MG tablet [id number] No Take 1 tablet (7.5 mg total) by mouth at bedtime. Levin Erp, MD 01/04/2024 Bedtime Active Spouse/Significant Other  MUCINEX 600 MG 12 hr tablet [id number] No Take 600 mg by mouth 2 (two) times daily. [provider] 01/05/2024 Morning Active Spouse/Significant Other  Multiple Vitamins-Minerals (CENTRUM MEN) TABS [id number] No Take 1 tablet by mouth daily with breakfast. [provider] 01/04/2024 Active Spouse/Significant Other  NOVOLOG FLEXPEN 100 UNIT/ML FlexPen [id number] No Inject 0-10 Units into the skin See admin instructions. Inject 0-10 units into the skin three times a day before meals, per Sliding Scale: BGL 0-59 OR 400-1,000 = CALL MD; 60-150 = give nothing; 151-199 = 2 units; 200-249 = 4 units; 250-299 = 6 units; 300-349 = 8 units; 350-399 = 10 units [provider] 01/05/2024 Noon Active Spouse/Significant Other  OXYGEN [id number] No Inhale 4 L/min into the lungs continuous. [provider] 12/04/2023 Active Nursing Home Medication Administration Guide (MAG)  pantoprazole (PROTONIX) 40 MG tablet [id number] No Take 1 tablet (40 mg total) by mouth daily.  Patient taking differently: Take 40 mg by mouth in the morning.   Jacquelynn Cree, [place] 01/05/2024 Morning Active Spouse/Significant Other  polyethylene glycol (MIRALAX / GLYCOLAX) 17 g packet [id number] No Take 17 g by mouth 2 (two) times daily as needed for mild constipation or moderate constipation. [provider] 12/04/2023  9:00 AM Active Spouse/Significant Other  rosuvastatin (CRESTOR) 10 MG tablet [id number] No TAKE 1 TABLET AT BEDTIME Maeola Harman, MD 01/04/2024 Bedtime Active Spouse/Significant Other  TRESIBA FLEXTOUCH 100 UNIT/ML FlexTouch Pen [id number] No Inject 5 Units into the skin in the morning. [provider] 01/05/2024 Morning Active Spouse/Significant Other            Home Care and  Equipment/Supplies: Were Home Health Services Ordered?: Yes Name of Home Health Agency:: [facility] Has Agency set up a time to come to your home?: Yes First Home Health Visit Date: 01/07/24 Any new equipment or medical supplies ordered?: NA  Functional Questionnaire: Do you need assistance with bathing/showering or dressing?: Yes Do you need assistance with meal preparation?: Yes Do you need assistance with eating?: No Do you have difficulty maintaining continence: Yes Do you need assistance with getting out of bed/getting out of a chair/moving?: Yes Do you have difficulty managing or taking your medications?: Yes  Follow up appointments reviewed: PCP Follow-up appointment confirmed?: Yes Date of PCP follow-up appointment?: 01/11/24 Follow-up Provider: [facility] Follow-up appointment confirmed?: Yes Date of Specialist follow-up appointment?: 01/19/24 Follow-Up Specialty Provider:: cardio Do you need transportation to your follow-up appointment?: No Do you understand care options if your condition(s) worsen?: Yes-patient verbalized understanding    SIGNATURE Karena Addison, LPN [facility] Nurse Health Advisor Direct Dial [phone]

## 2024-01-07 NOTE — Telephone Encounter (Signed)
 [name] calling (DPR). She says that her husband was in the ER recently for COPD exacerbation and they said he needs more 02. He was on 4.5 setting and wife has moved it to 5 but states she needs to have him on 6. The Dr. Stann Mainland need to send a RX to [facility] for this. Her # is [phone]

## 2024-01-10 NOTE — Telephone Encounter (Signed)
 Patient has been scheduled for a virtual visit.

## 2024-01-10 NOTE — Telephone Encounter (Signed)
 Pt spouse notified and she states he will have to have a video visit as he is unable to walk to come into clinic. Can you please schedule?

## 2024-01-10 NOTE — Telephone Encounter (Signed)
 Left voicemail for patient to call back.

## 2024-01-11 ENCOUNTER — Ambulatory Visit: Admitting: Student

## 2024-01-11 DIAGNOSIS — Z87898 Personal history of other specified conditions: Secondary | ICD-10-CM | POA: Diagnosis not present

## 2024-01-11 DIAGNOSIS — R4586 Emotional lability: Secondary | ICD-10-CM | POA: Diagnosis not present

## 2024-01-11 DIAGNOSIS — Z794 Long term (current) use of insulin: Secondary | ICD-10-CM

## 2024-01-11 DIAGNOSIS — E119 Type 2 diabetes mellitus without complications: Secondary | ICD-10-CM

## 2024-01-11 DIAGNOSIS — J4489 Other specified chronic obstructive pulmonary disease: Secondary | ICD-10-CM

## 2024-01-11 DIAGNOSIS — J439 Emphysema, unspecified: Secondary | ICD-10-CM

## 2024-01-11 DIAGNOSIS — I10 Essential (primary) hypertension: Secondary | ICD-10-CM

## 2024-01-11 MED ORDER — LEVETIRACETAM 500 MG PO TABS
500.0000 mg | ORAL_TABLET | Freq: Two times a day (BID) | ORAL | 0 refills | Status: AC
Start: 2024-01-11 — End: ?

## 2024-01-11 MED ORDER — MIRTAZAPINE 7.5 MG PO TABS: 7.5000 mg | ORAL_TABLET | Freq: Every day | ORAL | Status: DC

## 2024-01-11 NOTE — Assessment & Plan Note (Signed)
 Blood pressure plummeted to 70/50 after as needed clonidine at home.  His blood pressures have been stable around 130s to 140s. -Continue Coreg 6.25 twice daily -Continue hydralazine 25 mg 3 times daily -Stop as needed clonidine, wife instructed to call/message [place] if his blood pressures consistently over 150s

## 2024-01-11 NOTE — Progress Notes (Signed)
 [facility] Telemedicine Visit  Patient consented to have virtual visit and was identified by name and date of birth. Method of visit: Telephone  Encounter participants: Patient: Kyle [name] [facility]) - located at home  Provider: Levin Erp - located at [facility]  Chief Complaint: Insulin, BP and Hospital FU  HPI:  DM Wife does have some questions about insulin current regimen of: Sliding scale 1-10 units novolog  60-150 = give nothing; 151-199 = 2 units; 200-249 = 4 units; 250-299 = 6 units; 300-349 = 8 units; 350-399 = 10 units  As well as Tresiba 5 units daily Sugars -- the lowest has been 82- mostly ranging in the 100s to low 200s.  185, 117  HTN Was taking clonidine at SNF -- PRN sbp >120 - that day was 132/70 that day when she gave it Hydralazine 25 TID   Blood pressures are running 149/81, 148/72, 145/71, 129/79, 140/71, 152/66, 128/66  Has had 2 hospitalizations recently on 2/8 and 3/12 both for acute on chronic hypoxic respiratory failure in setting of COPD exacerbation.  Baseline is 4 to 5 L.  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  He was noted to be hypotensive down to 70/50 after wife gave him as needed clonidine at home.  Was given fluid bolus and had improvement after this.   Currently - breathing well at 5 L (baseline 4-5)  ROS: per HPI  Pertinent PMHx: Hemorrhagic CVA 2024, COPD baseline 4 to 5 L O2  Exam:  There were no vitals taken for this visit.  Deferred, wife was main communicator  Assessment/Plan:  Assessment & Plan Hypertension, unspecified type Blood pressure plummeted to 70/50 after as needed clonidine at home.  His blood pressures have been stable around 130s to 140s. -Continue Coreg 6.25 twice daily -Continue hydralazine 25 mg 3 times daily -Stop as needed clonidine, wife instructed to call/message [place] if his blood pressures consistently over 150s History of seizure Was continued on  daily keppra 500 mg at discharge but has been taking it as 500 mg BID for years. -Refilled his home Keppra 500 mg twice daily -Neurology follow-up Mood change -Refilled his home mirtazapine 7.5 mg daily COPD with chronic bronchitis and emphysema (HCC) 2 admissions that have been recent for acute on chronic hypoxic respiratory failure.  They are in touch with his pulmonologist and seeing them 3/27.  Currently stable on home 5 L. -ED/return precautions discussed Diabetes mellitus type 2, insulin dependent (HCC) Sugars overall controlled intermittent lower glucose x 1 of 80. -Continue tresiba 5 units daily -Continue sliding scale novolog -Wife instructed to reach out if low sugars as we may need to titrate regimen    Time spent during visit with patient: 30 minutes

## 2024-01-11 NOTE — Assessment & Plan Note (Signed)
 Sugars overall controlled intermittent lower glucose x 1 of 80. -Continue tresiba 5 units daily -Continue sliding scale novolog -Wife instructed to reach out if low sugars as we may need to titrate regimen

## 2024-01-11 NOTE — Patient Instructions (Signed)
 It was great to see you! Thank you for allowing me to participate in your care!   Our plans for today:  -I am discontinuing the clonidine given the response of his blood pressure on recent admission-please message or call us if his blood pressures are consistently 150s to 160s or above -Continue the [place] 5 units daily and a sliding scale as indicated, if he is having frequent blood sugars that are 80s to 70s to 60s please call us and we will adjust insulin -I have refilled his Keppra for 500 twice a day which is what he was taking-please follow-up with the neurologist -I have refilled his Remeron  Take care and seek immediate care sooner if you develop any concerns.  Levin Erp, MD

## 2024-01-12 DIAGNOSIS — L89112 Pressure ulcer of right upper back, stage 2: Secondary | ICD-10-CM | POA: Diagnosis not present

## 2024-01-12 DIAGNOSIS — I69322 Dysarthria following cerebral infarction: Secondary | ICD-10-CM | POA: Diagnosis not present

## 2024-01-12 DIAGNOSIS — E1151 Type 2 diabetes mellitus with diabetic peripheral angiopathy without gangrene: Secondary | ICD-10-CM | POA: Diagnosis not present

## 2024-01-12 DIAGNOSIS — Z993 Dependence on wheelchair: Secondary | ICD-10-CM | POA: Diagnosis not present

## 2024-01-12 DIAGNOSIS — I5022 Chronic systolic (congestive) heart failure: Secondary | ICD-10-CM | POA: Diagnosis not present

## 2024-01-12 DIAGNOSIS — I130 Hypertensive heart and chronic kidney disease with heart failure and stage 1 through stage 4 chronic kidney disease, or unspecified chronic kidney disease: Secondary | ICD-10-CM | POA: Diagnosis not present

## 2024-01-12 DIAGNOSIS — E1122 Type 2 diabetes mellitus with diabetic chronic kidney disease: Secondary | ICD-10-CM | POA: Diagnosis not present

## 2024-01-12 DIAGNOSIS — J449 Chronic obstructive pulmonary disease, unspecified: Secondary | ICD-10-CM | POA: Diagnosis not present

## 2024-01-12 DIAGNOSIS — M25551 Pain in right hip: Secondary | ICD-10-CM | POA: Diagnosis not present

## 2024-01-12 DIAGNOSIS — I4891 Unspecified atrial fibrillation: Secondary | ICD-10-CM | POA: Diagnosis not present

## 2024-01-12 DIAGNOSIS — J9611 Chronic respiratory failure with hypoxia: Secondary | ICD-10-CM | POA: Diagnosis not present

## 2024-01-12 DIAGNOSIS — N183 Chronic kidney disease, stage 3 unspecified: Secondary | ICD-10-CM | POA: Diagnosis not present

## 2024-01-12 DIAGNOSIS — I69351 Hemiplegia and hemiparesis following cerebral infarction affecting right dominant side: Secondary | ICD-10-CM | POA: Diagnosis not present

## 2024-01-12 DIAGNOSIS — I69392 Facial weakness following cerebral infarction: Secondary | ICD-10-CM | POA: Diagnosis not present

## 2024-01-13 DIAGNOSIS — I5022 Chronic systolic (congestive) heart failure: Secondary | ICD-10-CM | POA: Diagnosis not present

## 2024-01-13 DIAGNOSIS — I69322 Dysarthria following cerebral infarction: Secondary | ICD-10-CM | POA: Diagnosis not present

## 2024-01-13 DIAGNOSIS — E1151 Type 2 diabetes mellitus with diabetic peripheral angiopathy without gangrene: Secondary | ICD-10-CM | POA: Diagnosis not present

## 2024-01-13 DIAGNOSIS — J449 Chronic obstructive pulmonary disease, unspecified: Secondary | ICD-10-CM | POA: Diagnosis not present

## 2024-01-13 DIAGNOSIS — I69392 Facial weakness following cerebral infarction: Secondary | ICD-10-CM | POA: Diagnosis not present

## 2024-01-13 DIAGNOSIS — E1122 Type 2 diabetes mellitus with diabetic chronic kidney disease: Secondary | ICD-10-CM | POA: Diagnosis not present

## 2024-01-13 DIAGNOSIS — I69351 Hemiplegia and hemiparesis following cerebral infarction affecting right dominant side: Secondary | ICD-10-CM | POA: Diagnosis not present

## 2024-01-13 DIAGNOSIS — N183 Chronic kidney disease, stage 3 unspecified: Secondary | ICD-10-CM | POA: Diagnosis not present

## 2024-01-13 DIAGNOSIS — I130 Hypertensive heart and chronic kidney disease with heart failure and stage 1 through stage 4 chronic kidney disease, or unspecified chronic kidney disease: Secondary | ICD-10-CM | POA: Diagnosis not present

## 2024-01-14 DIAGNOSIS — I69322 Dysarthria following cerebral infarction: Secondary | ICD-10-CM | POA: Diagnosis not present

## 2024-01-14 DIAGNOSIS — E1151 Type 2 diabetes mellitus with diabetic peripheral angiopathy without gangrene: Secondary | ICD-10-CM | POA: Diagnosis not present

## 2024-01-14 DIAGNOSIS — I5022 Chronic systolic (congestive) heart failure: Secondary | ICD-10-CM | POA: Diagnosis not present

## 2024-01-14 DIAGNOSIS — E1122 Type 2 diabetes mellitus with diabetic chronic kidney disease: Secondary | ICD-10-CM | POA: Diagnosis not present

## 2024-01-14 DIAGNOSIS — J449 Chronic obstructive pulmonary disease, unspecified: Secondary | ICD-10-CM | POA: Diagnosis not present

## 2024-01-14 DIAGNOSIS — I69351 Hemiplegia and hemiparesis following cerebral infarction affecting right dominant side: Secondary | ICD-10-CM | POA: Diagnosis not present

## 2024-01-14 DIAGNOSIS — N183 Chronic kidney disease, stage 3 unspecified: Secondary | ICD-10-CM | POA: Diagnosis not present

## 2024-01-14 DIAGNOSIS — I130 Hypertensive heart and chronic kidney disease with heart failure and stage 1 through stage 4 chronic kidney disease, or unspecified chronic kidney disease: Secondary | ICD-10-CM | POA: Diagnosis not present

## 2024-01-14 DIAGNOSIS — I69392 Facial weakness following cerebral infarction: Secondary | ICD-10-CM | POA: Diagnosis not present

## 2024-01-14 NOTE — Addendum Note (Signed)
 Addended by: Levin Erp on: 01/14/2024 07:46 AM   Modules accepted: Orders

## 2024-01-17 DIAGNOSIS — I69351 Hemiplegia and hemiparesis following cerebral infarction affecting right dominant side: Secondary | ICD-10-CM | POA: Diagnosis not present

## 2024-01-17 DIAGNOSIS — I69322 Dysarthria following cerebral infarction: Secondary | ICD-10-CM | POA: Diagnosis not present

## 2024-01-17 DIAGNOSIS — E1122 Type 2 diabetes mellitus with diabetic chronic kidney disease: Secondary | ICD-10-CM | POA: Diagnosis not present

## 2024-01-17 DIAGNOSIS — N183 Chronic kidney disease, stage 3 unspecified: Secondary | ICD-10-CM | POA: Diagnosis not present

## 2024-01-17 DIAGNOSIS — J449 Chronic obstructive pulmonary disease, unspecified: Secondary | ICD-10-CM | POA: Diagnosis not present

## 2024-01-17 DIAGNOSIS — I130 Hypertensive heart and chronic kidney disease with heart failure and stage 1 through stage 4 chronic kidney disease, or unspecified chronic kidney disease: Secondary | ICD-10-CM | POA: Diagnosis not present

## 2024-01-17 DIAGNOSIS — I69392 Facial weakness following cerebral infarction: Secondary | ICD-10-CM | POA: Diagnosis not present

## 2024-01-17 DIAGNOSIS — I5022 Chronic systolic (congestive) heart failure: Secondary | ICD-10-CM | POA: Diagnosis not present

## 2024-01-17 DIAGNOSIS — E1151 Type 2 diabetes mellitus with diabetic peripheral angiopathy without gangrene: Secondary | ICD-10-CM | POA: Diagnosis not present

## 2024-01-18 DIAGNOSIS — I130 Hypertensive heart and chronic kidney disease with heart failure and stage 1 through stage 4 chronic kidney disease, or unspecified chronic kidney disease: Secondary | ICD-10-CM | POA: Diagnosis not present

## 2024-01-18 DIAGNOSIS — I69392 Facial weakness following cerebral infarction: Secondary | ICD-10-CM | POA: Diagnosis not present

## 2024-01-18 DIAGNOSIS — N183 Chronic kidney disease, stage 3 unspecified: Secondary | ICD-10-CM | POA: Diagnosis not present

## 2024-01-18 DIAGNOSIS — J449 Chronic obstructive pulmonary disease, unspecified: Secondary | ICD-10-CM | POA: Diagnosis not present

## 2024-01-18 DIAGNOSIS — I69351 Hemiplegia and hemiparesis following cerebral infarction affecting right dominant side: Secondary | ICD-10-CM | POA: Diagnosis not present

## 2024-01-18 DIAGNOSIS — E1122 Type 2 diabetes mellitus with diabetic chronic kidney disease: Secondary | ICD-10-CM | POA: Diagnosis not present

## 2024-01-18 DIAGNOSIS — I69322 Dysarthria following cerebral infarction: Secondary | ICD-10-CM | POA: Diagnosis not present

## 2024-01-18 DIAGNOSIS — I5022 Chronic systolic (congestive) heart failure: Secondary | ICD-10-CM | POA: Diagnosis not present

## 2024-01-18 DIAGNOSIS — E1151 Type 2 diabetes mellitus with diabetic peripheral angiopathy without gangrene: Secondary | ICD-10-CM | POA: Diagnosis not present

## 2024-01-19 ENCOUNTER — Ambulatory Visit: Admitting: Physician Assistant

## 2024-01-19 DIAGNOSIS — E1151 Type 2 diabetes mellitus with diabetic peripheral angiopathy without gangrene: Secondary | ICD-10-CM | POA: Diagnosis not present

## 2024-01-19 DIAGNOSIS — I69351 Hemiplegia and hemiparesis following cerebral infarction affecting right dominant side: Secondary | ICD-10-CM | POA: Diagnosis not present

## 2024-01-19 DIAGNOSIS — E1122 Type 2 diabetes mellitus with diabetic chronic kidney disease: Secondary | ICD-10-CM | POA: Diagnosis not present

## 2024-01-19 DIAGNOSIS — N183 Chronic kidney disease, stage 3 unspecified: Secondary | ICD-10-CM | POA: Diagnosis not present

## 2024-01-19 DIAGNOSIS — I5022 Chronic systolic (congestive) heart failure: Secondary | ICD-10-CM | POA: Diagnosis not present

## 2024-01-19 DIAGNOSIS — I69392 Facial weakness following cerebral infarction: Secondary | ICD-10-CM | POA: Diagnosis not present

## 2024-01-19 DIAGNOSIS — I130 Hypertensive heart and chronic kidney disease with heart failure and stage 1 through stage 4 chronic kidney disease, or unspecified chronic kidney disease: Secondary | ICD-10-CM | POA: Diagnosis not present

## 2024-01-19 DIAGNOSIS — J449 Chronic obstructive pulmonary disease, unspecified: Secondary | ICD-10-CM | POA: Diagnosis not present

## 2024-01-19 DIAGNOSIS — I69322 Dysarthria following cerebral infarction: Secondary | ICD-10-CM | POA: Diagnosis not present

## 2024-01-20 ENCOUNTER — Telehealth (INDEPENDENT_AMBULATORY_CARE_PROVIDER_SITE_OTHER): Admitting: Adult Health

## 2024-01-20 ENCOUNTER — Encounter (HOSPITAL_BASED_OUTPATIENT_CLINIC_OR_DEPARTMENT_OTHER): Payer: Self-pay | Admitting: Adult Health

## 2024-01-20 VITALS — BP 153/73 | HR 81 | Temp 97.5°F

## 2024-01-20 DIAGNOSIS — Z87891 Personal history of nicotine dependence: Secondary | ICD-10-CM

## 2024-01-20 DIAGNOSIS — J9611 Chronic respiratory failure with hypoxia: Secondary | ICD-10-CM

## 2024-01-20 DIAGNOSIS — I482 Chronic atrial fibrillation, unspecified: Secondary | ICD-10-CM

## 2024-01-20 DIAGNOSIS — J439 Emphysema, unspecified: Secondary | ICD-10-CM

## 2024-01-20 DIAGNOSIS — I420 Dilated cardiomyopathy: Secondary | ICD-10-CM

## 2024-01-20 DIAGNOSIS — J449 Chronic obstructive pulmonary disease, unspecified: Secondary | ICD-10-CM | POA: Diagnosis not present

## 2024-01-20 DIAGNOSIS — E43 Unspecified severe protein-calorie malnutrition: Secondary | ICD-10-CM

## 2024-01-20 NOTE — Assessment & Plan Note (Signed)
High-protein diet 

## 2024-01-20 NOTE — Assessment & Plan Note (Signed)
 Severe COPD with emphysema.  Appears stable on current regimen.  Continue on Trelegy inhaler.  Albuterol inhaler or nebulizer as needed

## 2024-01-20 NOTE — Progress Notes (Signed)
 Virtual Visit via Video Note  I connected with Kyle [name] on 01/20/24 at 10:00 AM EDT by a video enabled telemedicine application and verified that I am speaking with the correct person using two identifiers.  Location: Patient: Home  Provider: Office    I discussed the limitations of evaluation and management by telemedicine and the availability of in person appointments. The patient expressed understanding and agreed to proceed.  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  Patient was last seen March 2024.  Patient was recently hospitalized earlier this month for acute on chronic hypoxemia and hypotension.  CT chest revealed emphysema. BB opacities felt to be atelectasis/scarring.  Small bilateral pleural effusions.  Patient was given IV fluids with improvement.  Low blood pressure happened after patient was given clonidine at home, patient has a history of stroke with left-sided weakness and is bedbound at baseline.  Also had hemorrhagic infarct in 2024. Since discharge he is doing much better. No further low blood pressures. O2 sats are much better on 5l/m . No desats. Mental status at baseline. Wife is caregiver at home. Does have some PT and nursing that is coming to home. He is bedbound. Can not longer leave the home. Remains on Trelegy 1 puff daily . No increased albuterol use.  Remains on Eliquis for A Fib .Denies Hemoptysis. Appetite is doing better. No n/v/d.     Past Medical History:  Diagnosis Date   Aortic insufficiency    MODERATE WITH A BICUSPID AORTIC VAVLE   Arterial occlusive disease    MULTILEVEL   Atrial fibrillation (HCC)    CHF (congestive heart failure) (HCC)    Chronic bronchitis (HCC)    CKD  (chronic kidney disease) stage 3, GFR 30-59 ml/min (HCC)    COPD (chronic obstructive pulmonary disease) (HCC)    Dilated cardiomyopathy (HCC)    WITH EJECTION FRACTION DOWN TO 20-25%--WITH CONGESTIVE HEART FAILURE   Edema    LOWER EXTREMETIES   Emphysema of lung (HCC)    Hemorrhagic stroke (HCC) 09/06/2023   right side defecit   Hypertension    ICH (intracerebral hemorrhage) (HCC) 09/10/2023   Insulin dependent type 2 diabetes mellitus (HCC)    Normal coronary arteries 2009   Orthopnea    Peripheral arterial disease (HCC)    Pulmonary hypertension (HCC)    Current Outpatient Medications on File Prior to Visit  Medication Sig Dispense Refill   acetaminophen (TYLENOL) 325 MG tablet Take 2 tablets (650 mg total) by mouth every 4 (four) hours as needed for mild pain (pain score 1-3).     albuterol (PROVENTIL) (2.5 MG/3ML) 0.083% nebulizer solution Take 3 mLs (2.5 mg total) by nebulization every 4 (four) hours as needed for wheezing or shortness of breath.     amLODipine (NORVASC) 5 MG tablet Take 5 mg by mouth at bedtime.     apixaban (ELIQUIS) 5 MG TABS tablet TAKE 1 TABLET TWICE DAILY (Patient taking differently: Take 5 mg by mouth in the morning and at bedtime.) 180 tablet 1   carvedilol (COREG) 6.25 MG tablet TAKE 1 TABLET TWICE DAILY WITH MEALS (NEED MD APPOINTMENT) (Patient taking differently: Take 6.25 mg by mouth in the morning and at bedtime.) 180 tablet 0   DROPLET PEN NEEDLES 32G X 4 MM MISC USE AS  DIRECTED WITH INSULIN 300 each 3   Fluticasone-Umeclidin-Vilant (TRELEGY ELLIPTA) 200-62.5-25 MCG/ACT AEPB INHALE 1 PUFF EVERY DAY (Patient taking differently: Inhale 1 puff into the lungs in the morning.) 180 each 0   gabapentin (NEURONTIN) 600 MG tablet Take 1 tablet (600 mg total) by mouth at bedtime. 30 tablet 0   hydrALAZINE (APRESOLINE) 25 MG tablet Take 25 mg by mouth in the morning, at noon, and at bedtime.     isosorbide mononitrate (IMDUR) 30 MG 24 hr tablet TAKE 1 TABLET  EVERY DAY 90 tablet 0   levETIRAcetam (KEPPRA) 500 MG tablet Take 1 tablet (500 mg total) by mouth 2 (two) times daily. 180 tablet 0   mirtazapine (REMERON) 7.5 MG tablet Take 1 tablet (7.5 mg total) by mouth at bedtime.     MUCINEX 600 MG 12 hr tablet Take 600 mg by mouth 2 (two) times daily.     Multiple Vitamins-Minerals (CENTRUM MEN) TABS Take 1 tablet by mouth daily with breakfast.     NOVOLOG FLEXPEN 100 UNIT/ML FlexPen Inject 0-10 Units into the skin See admin instructions. Inject 0-10 units into the skin three times a day before meals, per Sliding Scale: BGL 0-59 OR 400-1,000 = CALL MD; 60-150 = give nothing; 151-199 = 2 units; 200-249 = 4 units; 250-299 = 6 units; 300-349 = 8 units; 350-399 = 10 units     OXYGEN Inhale 4 L/min into the lungs continuous.     pantoprazole (PROTONIX) 40 MG tablet Take 1 tablet (40 mg total) by mouth daily. (Patient taking differently: Take 40 mg by mouth in the morning.)     polyethylene glycol (MIRALAX / GLYCOLAX) 17 g packet Take 17 g by mouth 2 (two) times daily as needed for mild constipation or moderate constipation.     rosuvastatin (CRESTOR) 10 MG tablet TAKE 1 TABLET AT BEDTIME 90 tablet 3   TRESIBA FLEXTOUCH 100 UNIT/ML FlexTouch Pen Inject 5 Units into the skin in the morning.     ipratropium-albuterol (DUONEB) 0.5-2.5 (3) MG/3ML SOLN Take 3 mLs by nebulization in the morning, at noon, and at bedtime. (Patient not taking: Reported on 01/05/2024)     No current facility-administered medications on file prior to visit.       Observations/Objective:  DCT 11/25/22 >> new RUL nodules 6mm & 4.5 mm, new LUL nodule 8mm LDCT 04/2021 RADS2 - RLL nodule unchanged   LDCT 02/2020 - lung RADS 2.  Moderate to advanced changes of emphysema.  And tiny nodule in the left base at 4.7 mm.   11/20/2018-CT head without contrast- sinuses normal   12/16/2018-spirometry- FVC 1.7 (43% predicted), ratio 64, FEV1 1.1 (36% predicted), consistent with severe airway  obstruction     03/2021 echo LVEF 45 to 50%, RVSP 55 11/21/2018-echocardiogram-LV ejection fraction 40 to 45%, diffuse hypokinesis, right ventricle moderately dilated, systolic function was moderately reduced, PA P pressure 53  01/20/2024 - In bed in NAD , on O2 .    Assessment and Plan: COPD with Emphysema appears at baseline continue on Trelegy.  Albuterol inhaler or nebulizer as needed O2 RF continue on oxygen to maintain O2 saturation greater than 88 to 9% CM/CHF/A Fib -appears compensated.  Continue follow-up with cardiology  Hypertension.  Recent hospitalization for hypotension after using clonidine as needed.  Resolved with IV fluids.  Continue follow-up with cardiology History of stroke with left-sided weakness.  Bedbound.  Continue with home physical therapy and nursing care.  Plan  Patient Instructions  Continue on Trelegy 1  puff daily  Albuterol inhaler or neb As needed   Continue on Oxygen 5l/m ,to keep sats >88-90% Home PT and nursing care as planned.  Follow up with Dr. Vassie Loll  in 4 months and As needed       Follow Up Instructions: Follow-up in 4 months and as needed   I discussed the assessment and treatment plan with the patient. The patient was provided an opportunity to ask questions and all were answered. The patient agreed with the plan and demonstrated an understanding of the instructions.   The patient was advised to call back or seek an in-person evaluation if the symptoms worsen or if the condition fails to improve as anticipated.  I provided 30  minutes of non-face-to-face time during this encounter.   Rubye Oaks, NP

## 2024-01-20 NOTE — Assessment & Plan Note (Signed)
 Cardiomyopathy and congestive heart failure along with A-fib.  Continue on current regimen with cardiology. Recent hospitalization with hypotension.  Appears to have resolved.

## 2024-01-20 NOTE — Assessment & Plan Note (Signed)
Continue on current regimen.  Follow-up with cardiology as planned 

## 2024-01-20 NOTE — Assessment & Plan Note (Signed)
 Continue on oxygen 5 L to maintain O2 saturations greater than 88 to 9%.

## 2024-01-20 NOTE — Patient Instructions (Signed)
 Continue on Trelegy 1 puff daily  Albuterol inhaler or neb As needed   Continue on Oxygen 5l/m ,to keep sats >88-90% Home PT and nursing care as planned.  Follow up with Dr. Vassie Loll  in 4 months and As needed

## 2024-01-24 DIAGNOSIS — I69392 Facial weakness following cerebral infarction: Secondary | ICD-10-CM | POA: Diagnosis not present

## 2024-01-24 DIAGNOSIS — I5022 Chronic systolic (congestive) heart failure: Secondary | ICD-10-CM | POA: Diagnosis not present

## 2024-01-24 DIAGNOSIS — I69351 Hemiplegia and hemiparesis following cerebral infarction affecting right dominant side: Secondary | ICD-10-CM | POA: Diagnosis not present

## 2024-01-24 DIAGNOSIS — I69322 Dysarthria following cerebral infarction: Secondary | ICD-10-CM | POA: Diagnosis not present

## 2024-01-24 DIAGNOSIS — I130 Hypertensive heart and chronic kidney disease with heart failure and stage 1 through stage 4 chronic kidney disease, or unspecified chronic kidney disease: Secondary | ICD-10-CM | POA: Diagnosis not present

## 2024-01-24 DIAGNOSIS — J449 Chronic obstructive pulmonary disease, unspecified: Secondary | ICD-10-CM | POA: Diagnosis not present

## 2024-01-24 DIAGNOSIS — E1122 Type 2 diabetes mellitus with diabetic chronic kidney disease: Secondary | ICD-10-CM | POA: Diagnosis not present

## 2024-01-24 DIAGNOSIS — E1151 Type 2 diabetes mellitus with diabetic peripheral angiopathy without gangrene: Secondary | ICD-10-CM | POA: Diagnosis not present

## 2024-01-24 DIAGNOSIS — N183 Chronic kidney disease, stage 3 unspecified: Secondary | ICD-10-CM | POA: Diagnosis not present

## 2024-01-26 ENCOUNTER — Telehealth: Payer: Self-pay

## 2024-01-26 DIAGNOSIS — I69351 Hemiplegia and hemiparesis following cerebral infarction affecting right dominant side: Secondary | ICD-10-CM | POA: Diagnosis not present

## 2024-01-26 DIAGNOSIS — E1122 Type 2 diabetes mellitus with diabetic chronic kidney disease: Secondary | ICD-10-CM | POA: Diagnosis not present

## 2024-01-26 DIAGNOSIS — I5022 Chronic systolic (congestive) heart failure: Secondary | ICD-10-CM | POA: Diagnosis not present

## 2024-01-26 DIAGNOSIS — E1151 Type 2 diabetes mellitus with diabetic peripheral angiopathy without gangrene: Secondary | ICD-10-CM | POA: Diagnosis not present

## 2024-01-26 DIAGNOSIS — I69322 Dysarthria following cerebral infarction: Secondary | ICD-10-CM | POA: Diagnosis not present

## 2024-01-26 DIAGNOSIS — J449 Chronic obstructive pulmonary disease, unspecified: Secondary | ICD-10-CM | POA: Diagnosis not present

## 2024-01-26 DIAGNOSIS — I130 Hypertensive heart and chronic kidney disease with heart failure and stage 1 through stage 4 chronic kidney disease, or unspecified chronic kidney disease: Secondary | ICD-10-CM | POA: Diagnosis not present

## 2024-01-26 DIAGNOSIS — I69392 Facial weakness following cerebral infarction: Secondary | ICD-10-CM | POA: Diagnosis not present

## 2024-01-26 DIAGNOSIS — N183 Chronic kidney disease, stage 3 unspecified: Secondary | ICD-10-CM | POA: Diagnosis not present

## 2024-01-26 NOTE — Telephone Encounter (Signed)
 Kyle [name], Home health RN calls nurse line requesting verbal orders for wound care.   She is requesting to clean wound with vashe wound cleanser, pat dry and apply calcium alginate and cover with foam dressing. She is requesting to do this wound care three times per week.   She is also asking that we send order to medical equipment company for alternating pressure mattress. Advised that patient would need appointment per insurance guidelines for DME.   Patient is already scheduled for virtual visit on 01/31/24.  Please advise if wound care orders are appropriate, I can then call back and provide with verbal order.   Veronda Prude, RN

## 2024-01-27 ENCOUNTER — Encounter (HOSPITAL_COMMUNITY): Payer: Medicare HMO

## 2024-01-27 ENCOUNTER — Other Ambulatory Visit: Payer: Self-pay | Admitting: Student

## 2024-01-27 ENCOUNTER — Ambulatory Visit: Payer: Medicare HMO | Admitting: Vascular Surgery

## 2024-01-27 DIAGNOSIS — I69351 Hemiplegia and hemiparesis following cerebral infarction affecting right dominant side: Secondary | ICD-10-CM | POA: Diagnosis not present

## 2024-01-27 DIAGNOSIS — I130 Hypertensive heart and chronic kidney disease with heart failure and stage 1 through stage 4 chronic kidney disease, or unspecified chronic kidney disease: Secondary | ICD-10-CM | POA: Diagnosis not present

## 2024-01-27 DIAGNOSIS — E1151 Type 2 diabetes mellitus with diabetic peripheral angiopathy without gangrene: Secondary | ICD-10-CM | POA: Diagnosis not present

## 2024-01-27 DIAGNOSIS — E1122 Type 2 diabetes mellitus with diabetic chronic kidney disease: Secondary | ICD-10-CM | POA: Diagnosis not present

## 2024-01-27 DIAGNOSIS — I69322 Dysarthria following cerebral infarction: Secondary | ICD-10-CM | POA: Diagnosis not present

## 2024-01-27 DIAGNOSIS — R4586 Emotional lability: Secondary | ICD-10-CM

## 2024-01-27 DIAGNOSIS — J449 Chronic obstructive pulmonary disease, unspecified: Secondary | ICD-10-CM | POA: Diagnosis not present

## 2024-01-27 DIAGNOSIS — N183 Chronic kidney disease, stage 3 unspecified: Secondary | ICD-10-CM | POA: Diagnosis not present

## 2024-01-27 DIAGNOSIS — I69392 Facial weakness following cerebral infarction: Secondary | ICD-10-CM | POA: Diagnosis not present

## 2024-01-27 DIAGNOSIS — I5022 Chronic systolic (congestive) heart failure: Secondary | ICD-10-CM | POA: Diagnosis not present

## 2024-01-27 MED ORDER — MIRTAZAPINE 7.5 MG PO TABS: 7.5000 mg | ORAL_TABLET | Freq: Every day | ORAL | Status: DC

## 2024-01-27 NOTE — Telephone Encounter (Signed)
 Spoke with Alisa in regards to wound orders.   Advised DME will be discussed at 4/7 visit.

## 2024-01-28 DIAGNOSIS — I5022 Chronic systolic (congestive) heart failure: Secondary | ICD-10-CM | POA: Diagnosis not present

## 2024-01-28 DIAGNOSIS — I69351 Hemiplegia and hemiparesis following cerebral infarction affecting right dominant side: Secondary | ICD-10-CM | POA: Diagnosis not present

## 2024-01-28 DIAGNOSIS — N183 Chronic kidney disease, stage 3 unspecified: Secondary | ICD-10-CM | POA: Diagnosis not present

## 2024-01-28 DIAGNOSIS — I130 Hypertensive heart and chronic kidney disease with heart failure and stage 1 through stage 4 chronic kidney disease, or unspecified chronic kidney disease: Secondary | ICD-10-CM | POA: Diagnosis not present

## 2024-01-28 DIAGNOSIS — E1122 Type 2 diabetes mellitus with diabetic chronic kidney disease: Secondary | ICD-10-CM | POA: Diagnosis not present

## 2024-01-28 DIAGNOSIS — E1151 Type 2 diabetes mellitus with diabetic peripheral angiopathy without gangrene: Secondary | ICD-10-CM | POA: Diagnosis not present

## 2024-01-28 DIAGNOSIS — I69322 Dysarthria following cerebral infarction: Secondary | ICD-10-CM | POA: Diagnosis not present

## 2024-01-28 DIAGNOSIS — I69392 Facial weakness following cerebral infarction: Secondary | ICD-10-CM | POA: Diagnosis not present

## 2024-01-28 DIAGNOSIS — J449 Chronic obstructive pulmonary disease, unspecified: Secondary | ICD-10-CM | POA: Diagnosis not present

## 2024-01-31 ENCOUNTER — Telehealth: Admitting: Student

## 2024-01-31 DIAGNOSIS — I69351 Hemiplegia and hemiparesis following cerebral infarction affecting right dominant side: Secondary | ICD-10-CM

## 2024-01-31 DIAGNOSIS — I69392 Facial weakness following cerebral infarction: Secondary | ICD-10-CM | POA: Diagnosis not present

## 2024-01-31 DIAGNOSIS — J449 Chronic obstructive pulmonary disease, unspecified: Secondary | ICD-10-CM | POA: Diagnosis not present

## 2024-01-31 DIAGNOSIS — N183 Chronic kidney disease, stage 3 unspecified: Secondary | ICD-10-CM | POA: Diagnosis not present

## 2024-01-31 DIAGNOSIS — R4586 Emotional lability: Secondary | ICD-10-CM

## 2024-01-31 DIAGNOSIS — I69322 Dysarthria following cerebral infarction: Secondary | ICD-10-CM | POA: Diagnosis not present

## 2024-01-31 DIAGNOSIS — E1151 Type 2 diabetes mellitus with diabetic peripheral angiopathy without gangrene: Secondary | ICD-10-CM | POA: Diagnosis not present

## 2024-01-31 DIAGNOSIS — E1122 Type 2 diabetes mellitus with diabetic chronic kidney disease: Secondary | ICD-10-CM | POA: Diagnosis not present

## 2024-01-31 DIAGNOSIS — I639 Cerebral infarction, unspecified: Secondary | ICD-10-CM

## 2024-01-31 DIAGNOSIS — I130 Hypertensive heart and chronic kidney disease with heart failure and stage 1 through stage 4 chronic kidney disease, or unspecified chronic kidney disease: Secondary | ICD-10-CM | POA: Diagnosis not present

## 2024-01-31 DIAGNOSIS — I5022 Chronic systolic (congestive) heart failure: Secondary | ICD-10-CM | POA: Diagnosis not present

## 2024-01-31 MED ORDER — MIRTAZAPINE 7.5 MG PO TABS
7.5000 mg | ORAL_TABLET | Freq: Every day | ORAL | 0 refills | Status: DC
Start: 2024-01-31 — End: 2024-04-24

## 2024-01-31 MED ORDER — GABAPENTIN 600 MG PO TABS: 600.0000 mg | ORAL_TABLET | Freq: Every day | ORAL | 0 refills | Status: DC

## 2024-01-31 NOTE — Progress Notes (Signed)
 [facility] Telemedicine Visit  Patient consented to have virtual visit and was identified by name and date of birth. Method of visit: Video  Encounter participants: Patient: Kyle [name] - located at home Provider: Levin Erp - located at [facility] Others (if applicable): Elizebeth Brooking  Chief Complaint: Needs electric wheelchair  Complaint: The major reason for this visit was to conduct a mobility examination. The patient requires a power mobility device now because: of max assist with physical therapy Height and weight for this patient are located in the vitals portion of the chart My evaluation of this patient is detailed in the following checklist. This is put in check list format for ease of access to information and standardization. This note pertains only to this patient. 1. This patient meets the following criteria for a power mobility device based on presence of:   a). History and location of pressure sores: right ankle unstageable, buttocks stage 2  b)  Ability to shift weight is: limited  c.) The power mobility device is needed for oxygen desaturation on 4 to 5 L oxygen at baseline 2. MRADLs (Mobility related Activities of Daily Living inability to stand/walk - impairing their ability to get to bathroom/ toilet/bathe, get to kitchen to prepare meals / eat, get to bedroom to groom / dress The MR ADLs  Impaired or impacted in the home by the patient's diagnoses/conditions listed above include :  COPD on 4-5L baseline, hemmorhaghic CVA with residual dysarthria and right sided hemiplegia The power mobility device is necessary to assist this patient with the following MRADLs  bathing, toileting, eating, dressing 3. The ASSISTIVE device needed is a power mobilty device. This patient cannot use a cane, awalker, a manual wheelchair or a scooter because:COPD on 4-5 L and desaturations with movement, Right sided hemiplegia from hemmoraghic CVA   4. Without the  requested power mobility device,  a.) This patient will not be able to accomplish bathing, toileting, eating, dressing at all    b.) this patient will not be able to accomplish the MRADL listed in a reasonable time frame  c.)This patient is placed at a reasonably determined heightened risk of morbidity or mortality secondary to attempts to perform the MRADLs specified without this power mobility device device: 5. . This patient's mobility needs will not be met by a  cane, a walker, a manual wheelchair or a scooter due to: poor strength (right hemiplegia), max assist with PT, truncal instability  6.This patient can safely operate the power mobility device in their home. 7. This patient is willing and motivated to use this requested power mobility device in their home. 8. EXAMINATION: Strength: Per PT in room RUE and RLE flaccid to minimal d/t stroke. LUE and LLE normal GAIT: non-ambulatory   ROS: per HPI  Pertinent PMHx: CVA with right sided deficits, COPD 4-5L  Exam:  There were no vitals taken for this visit.  Respiratory: Dysarthria but no iWOB on 5L [place]  Assessment/Plan:  Assessment & Plan Cerebrovascular accident (CVA), unspecified mechanism (HCC) Right sided deficits and COPD on 4-5L O2. Requires motorized wheelchair as indicated above with assessment. -DME placed  Refilled home gabapentin and mirtazapine   Time spent during visit with patient: 30 minutes

## 2024-02-01 DIAGNOSIS — E1122 Type 2 diabetes mellitus with diabetic chronic kidney disease: Secondary | ICD-10-CM | POA: Diagnosis not present

## 2024-02-01 DIAGNOSIS — I130 Hypertensive heart and chronic kidney disease with heart failure and stage 1 through stage 4 chronic kidney disease, or unspecified chronic kidney disease: Secondary | ICD-10-CM | POA: Diagnosis not present

## 2024-02-01 DIAGNOSIS — I69392 Facial weakness following cerebral infarction: Secondary | ICD-10-CM | POA: Diagnosis not present

## 2024-02-01 DIAGNOSIS — E1151 Type 2 diabetes mellitus with diabetic peripheral angiopathy without gangrene: Secondary | ICD-10-CM | POA: Diagnosis not present

## 2024-02-01 DIAGNOSIS — I69351 Hemiplegia and hemiparesis following cerebral infarction affecting right dominant side: Secondary | ICD-10-CM | POA: Diagnosis not present

## 2024-02-01 DIAGNOSIS — J449 Chronic obstructive pulmonary disease, unspecified: Secondary | ICD-10-CM | POA: Diagnosis not present

## 2024-02-01 DIAGNOSIS — I69322 Dysarthria following cerebral infarction: Secondary | ICD-10-CM | POA: Diagnosis not present

## 2024-02-01 DIAGNOSIS — N183 Chronic kidney disease, stage 3 unspecified: Secondary | ICD-10-CM | POA: Diagnosis not present

## 2024-02-01 DIAGNOSIS — I5022 Chronic systolic (congestive) heart failure: Secondary | ICD-10-CM | POA: Diagnosis not present

## 2024-02-01 NOTE — Telephone Encounter (Signed)
Community message sent to [employer]. 

## 2024-02-01 NOTE — Telephone Encounter (Signed)
Confirmation received from [employer].  

## 2024-02-02 ENCOUNTER — Encounter: Payer: Self-pay | Admitting: Student

## 2024-02-02 DIAGNOSIS — N183 Chronic kidney disease, stage 3 unspecified: Secondary | ICD-10-CM | POA: Diagnosis not present

## 2024-02-02 DIAGNOSIS — E1151 Type 2 diabetes mellitus with diabetic peripheral angiopathy without gangrene: Secondary | ICD-10-CM | POA: Diagnosis not present

## 2024-02-02 DIAGNOSIS — E1122 Type 2 diabetes mellitus with diabetic chronic kidney disease: Secondary | ICD-10-CM | POA: Diagnosis not present

## 2024-02-02 DIAGNOSIS — I69392 Facial weakness following cerebral infarction: Secondary | ICD-10-CM | POA: Diagnosis not present

## 2024-02-02 DIAGNOSIS — I5022 Chronic systolic (congestive) heart failure: Secondary | ICD-10-CM | POA: Diagnosis not present

## 2024-02-02 DIAGNOSIS — J449 Chronic obstructive pulmonary disease, unspecified: Secondary | ICD-10-CM | POA: Diagnosis not present

## 2024-02-02 DIAGNOSIS — I130 Hypertensive heart and chronic kidney disease with heart failure and stage 1 through stage 4 chronic kidney disease, or unspecified chronic kidney disease: Secondary | ICD-10-CM | POA: Diagnosis not present

## 2024-02-02 DIAGNOSIS — I69351 Hemiplegia and hemiparesis following cerebral infarction affecting right dominant side: Secondary | ICD-10-CM | POA: Diagnosis not present

## 2024-02-02 DIAGNOSIS — I69322 Dysarthria following cerebral infarction: Secondary | ICD-10-CM | POA: Diagnosis not present

## 2024-02-03 MED ORDER — PANTOPRAZOLE SODIUM 40 MG PO TBEC: 40.0000 mg | DELAYED_RELEASE_TABLET | Freq: Every day | ORAL | 0 refills | Status: DC

## 2024-02-07 DIAGNOSIS — E1151 Type 2 diabetes mellitus with diabetic peripheral angiopathy without gangrene: Secondary | ICD-10-CM | POA: Diagnosis not present

## 2024-02-07 DIAGNOSIS — I69351 Hemiplegia and hemiparesis following cerebral infarction affecting right dominant side: Secondary | ICD-10-CM | POA: Diagnosis not present

## 2024-02-07 DIAGNOSIS — I130 Hypertensive heart and chronic kidney disease with heart failure and stage 1 through stage 4 chronic kidney disease, or unspecified chronic kidney disease: Secondary | ICD-10-CM | POA: Diagnosis not present

## 2024-02-07 DIAGNOSIS — N183 Chronic kidney disease, stage 3 unspecified: Secondary | ICD-10-CM | POA: Diagnosis not present

## 2024-02-07 DIAGNOSIS — E1122 Type 2 diabetes mellitus with diabetic chronic kidney disease: Secondary | ICD-10-CM | POA: Diagnosis not present

## 2024-02-07 DIAGNOSIS — I69392 Facial weakness following cerebral infarction: Secondary | ICD-10-CM | POA: Diagnosis not present

## 2024-02-07 DIAGNOSIS — I5022 Chronic systolic (congestive) heart failure: Secondary | ICD-10-CM | POA: Diagnosis not present

## 2024-02-07 DIAGNOSIS — J449 Chronic obstructive pulmonary disease, unspecified: Secondary | ICD-10-CM | POA: Diagnosis not present

## 2024-02-07 DIAGNOSIS — I69322 Dysarthria following cerebral infarction: Secondary | ICD-10-CM | POA: Diagnosis not present

## 2024-02-08 DIAGNOSIS — I69351 Hemiplegia and hemiparesis following cerebral infarction affecting right dominant side: Secondary | ICD-10-CM | POA: Diagnosis not present

## 2024-02-08 DIAGNOSIS — I69392 Facial weakness following cerebral infarction: Secondary | ICD-10-CM | POA: Diagnosis not present

## 2024-02-08 DIAGNOSIS — E1122 Type 2 diabetes mellitus with diabetic chronic kidney disease: Secondary | ICD-10-CM | POA: Diagnosis not present

## 2024-02-08 DIAGNOSIS — N183 Chronic kidney disease, stage 3 unspecified: Secondary | ICD-10-CM | POA: Diagnosis not present

## 2024-02-08 DIAGNOSIS — J449 Chronic obstructive pulmonary disease, unspecified: Secondary | ICD-10-CM | POA: Diagnosis not present

## 2024-02-08 DIAGNOSIS — I130 Hypertensive heart and chronic kidney disease with heart failure and stage 1 through stage 4 chronic kidney disease, or unspecified chronic kidney disease: Secondary | ICD-10-CM | POA: Diagnosis not present

## 2024-02-08 DIAGNOSIS — E1151 Type 2 diabetes mellitus with diabetic peripheral angiopathy without gangrene: Secondary | ICD-10-CM | POA: Diagnosis not present

## 2024-02-08 DIAGNOSIS — I5022 Chronic systolic (congestive) heart failure: Secondary | ICD-10-CM | POA: Diagnosis not present

## 2024-02-08 DIAGNOSIS — I69322 Dysarthria following cerebral infarction: Secondary | ICD-10-CM | POA: Diagnosis not present

## 2024-02-10 DIAGNOSIS — I5022 Chronic systolic (congestive) heart failure: Secondary | ICD-10-CM | POA: Diagnosis not present

## 2024-02-10 DIAGNOSIS — I69392 Facial weakness following cerebral infarction: Secondary | ICD-10-CM | POA: Diagnosis not present

## 2024-02-10 DIAGNOSIS — E1151 Type 2 diabetes mellitus with diabetic peripheral angiopathy without gangrene: Secondary | ICD-10-CM | POA: Diagnosis not present

## 2024-02-10 DIAGNOSIS — J449 Chronic obstructive pulmonary disease, unspecified: Secondary | ICD-10-CM | POA: Diagnosis not present

## 2024-02-10 DIAGNOSIS — N183 Chronic kidney disease, stage 3 unspecified: Secondary | ICD-10-CM | POA: Diagnosis not present

## 2024-02-10 DIAGNOSIS — E1122 Type 2 diabetes mellitus with diabetic chronic kidney disease: Secondary | ICD-10-CM | POA: Diagnosis not present

## 2024-02-10 DIAGNOSIS — I69351 Hemiplegia and hemiparesis following cerebral infarction affecting right dominant side: Secondary | ICD-10-CM | POA: Diagnosis not present

## 2024-02-10 DIAGNOSIS — I130 Hypertensive heart and chronic kidney disease with heart failure and stage 1 through stage 4 chronic kidney disease, or unspecified chronic kidney disease: Secondary | ICD-10-CM | POA: Diagnosis not present

## 2024-02-10 DIAGNOSIS — I69322 Dysarthria following cerebral infarction: Secondary | ICD-10-CM | POA: Diagnosis not present

## 2024-02-11 DIAGNOSIS — I130 Hypertensive heart and chronic kidney disease with heart failure and stage 1 through stage 4 chronic kidney disease, or unspecified chronic kidney disease: Secondary | ICD-10-CM | POA: Diagnosis not present

## 2024-02-11 DIAGNOSIS — N183 Chronic kidney disease, stage 3 unspecified: Secondary | ICD-10-CM | POA: Diagnosis not present

## 2024-02-11 DIAGNOSIS — I69322 Dysarthria following cerebral infarction: Secondary | ICD-10-CM | POA: Diagnosis not present

## 2024-02-11 DIAGNOSIS — E1122 Type 2 diabetes mellitus with diabetic chronic kidney disease: Secondary | ICD-10-CM | POA: Diagnosis not present

## 2024-02-11 DIAGNOSIS — I69351 Hemiplegia and hemiparesis following cerebral infarction affecting right dominant side: Secondary | ICD-10-CM | POA: Diagnosis not present

## 2024-02-11 DIAGNOSIS — E1151 Type 2 diabetes mellitus with diabetic peripheral angiopathy without gangrene: Secondary | ICD-10-CM | POA: Diagnosis not present

## 2024-02-11 DIAGNOSIS — J449 Chronic obstructive pulmonary disease, unspecified: Secondary | ICD-10-CM | POA: Diagnosis not present

## 2024-02-11 DIAGNOSIS — I69392 Facial weakness following cerebral infarction: Secondary | ICD-10-CM | POA: Diagnosis not present

## 2024-02-11 DIAGNOSIS — I5022 Chronic systolic (congestive) heart failure: Secondary | ICD-10-CM | POA: Diagnosis not present

## 2024-02-14 DIAGNOSIS — I69392 Facial weakness following cerebral infarction: Secondary | ICD-10-CM | POA: Diagnosis not present

## 2024-02-14 DIAGNOSIS — J449 Chronic obstructive pulmonary disease, unspecified: Secondary | ICD-10-CM | POA: Diagnosis not present

## 2024-02-14 DIAGNOSIS — N183 Chronic kidney disease, stage 3 unspecified: Secondary | ICD-10-CM | POA: Diagnosis not present

## 2024-02-14 DIAGNOSIS — I69322 Dysarthria following cerebral infarction: Secondary | ICD-10-CM | POA: Diagnosis not present

## 2024-02-14 DIAGNOSIS — E1122 Type 2 diabetes mellitus with diabetic chronic kidney disease: Secondary | ICD-10-CM | POA: Diagnosis not present

## 2024-02-14 DIAGNOSIS — I130 Hypertensive heart and chronic kidney disease with heart failure and stage 1 through stage 4 chronic kidney disease, or unspecified chronic kidney disease: Secondary | ICD-10-CM | POA: Diagnosis not present

## 2024-02-14 DIAGNOSIS — I5022 Chronic systolic (congestive) heart failure: Secondary | ICD-10-CM | POA: Diagnosis not present

## 2024-02-14 DIAGNOSIS — E1151 Type 2 diabetes mellitus with diabetic peripheral angiopathy without gangrene: Secondary | ICD-10-CM | POA: Diagnosis not present

## 2024-02-14 DIAGNOSIS — I69351 Hemiplegia and hemiparesis following cerebral infarction affecting right dominant side: Secondary | ICD-10-CM | POA: Diagnosis not present

## 2024-02-16 DIAGNOSIS — I5022 Chronic systolic (congestive) heart failure: Secondary | ICD-10-CM | POA: Diagnosis not present

## 2024-02-16 DIAGNOSIS — I69351 Hemiplegia and hemiparesis following cerebral infarction affecting right dominant side: Secondary | ICD-10-CM | POA: Diagnosis not present

## 2024-02-16 DIAGNOSIS — I130 Hypertensive heart and chronic kidney disease with heart failure and stage 1 through stage 4 chronic kidney disease, or unspecified chronic kidney disease: Secondary | ICD-10-CM | POA: Diagnosis not present

## 2024-02-16 DIAGNOSIS — I69322 Dysarthria following cerebral infarction: Secondary | ICD-10-CM | POA: Diagnosis not present

## 2024-02-16 DIAGNOSIS — J449 Chronic obstructive pulmonary disease, unspecified: Secondary | ICD-10-CM | POA: Diagnosis not present

## 2024-02-16 DIAGNOSIS — E1151 Type 2 diabetes mellitus with diabetic peripheral angiopathy without gangrene: Secondary | ICD-10-CM | POA: Diagnosis not present

## 2024-02-16 DIAGNOSIS — I69392 Facial weakness following cerebral infarction: Secondary | ICD-10-CM | POA: Diagnosis not present

## 2024-02-16 DIAGNOSIS — N183 Chronic kidney disease, stage 3 unspecified: Secondary | ICD-10-CM | POA: Diagnosis not present

## 2024-02-16 DIAGNOSIS — E1122 Type 2 diabetes mellitus with diabetic chronic kidney disease: Secondary | ICD-10-CM | POA: Diagnosis not present

## 2024-02-18 DIAGNOSIS — I69351 Hemiplegia and hemiparesis following cerebral infarction affecting right dominant side: Secondary | ICD-10-CM | POA: Diagnosis not present

## 2024-02-18 DIAGNOSIS — N183 Chronic kidney disease, stage 3 unspecified: Secondary | ICD-10-CM | POA: Diagnosis not present

## 2024-02-18 DIAGNOSIS — E1151 Type 2 diabetes mellitus with diabetic peripheral angiopathy without gangrene: Secondary | ICD-10-CM | POA: Diagnosis not present

## 2024-02-18 DIAGNOSIS — I69322 Dysarthria following cerebral infarction: Secondary | ICD-10-CM | POA: Diagnosis not present

## 2024-02-18 DIAGNOSIS — I69392 Facial weakness following cerebral infarction: Secondary | ICD-10-CM | POA: Diagnosis not present

## 2024-02-18 DIAGNOSIS — I5022 Chronic systolic (congestive) heart failure: Secondary | ICD-10-CM | POA: Diagnosis not present

## 2024-02-18 DIAGNOSIS — E1122 Type 2 diabetes mellitus with diabetic chronic kidney disease: Secondary | ICD-10-CM | POA: Diagnosis not present

## 2024-02-18 DIAGNOSIS — J449 Chronic obstructive pulmonary disease, unspecified: Secondary | ICD-10-CM | POA: Diagnosis not present

## 2024-02-18 DIAGNOSIS — I130 Hypertensive heart and chronic kidney disease with heart failure and stage 1 through stage 4 chronic kidney disease, or unspecified chronic kidney disease: Secondary | ICD-10-CM | POA: Diagnosis not present

## 2024-02-21 ENCOUNTER — Telehealth: Payer: Self-pay

## 2024-02-21 NOTE — Telephone Encounter (Signed)
 Received call from [facility] at [employer] regarding wheelchair order form completion.   Advised that we have received packet and are waiting for provider completion.   She did advise that paperwork would need attending signature due to Medicare guidelines.   Will forward to PCP.   Elsie Halo, RN

## 2024-02-21 NOTE — Telephone Encounter (Signed)
 Form signed and placed in to be fax pile today

## 2024-02-23 DIAGNOSIS — I5022 Chronic systolic (congestive) heart failure: Secondary | ICD-10-CM | POA: Diagnosis not present

## 2024-02-23 DIAGNOSIS — L89312 Pressure ulcer of right buttock, stage 2: Secondary | ICD-10-CM | POA: Diagnosis not present

## 2024-02-23 DIAGNOSIS — J449 Chronic obstructive pulmonary disease, unspecified: Secondary | ICD-10-CM | POA: Diagnosis not present

## 2024-02-23 DIAGNOSIS — I130 Hypertensive heart and chronic kidney disease with heart failure and stage 1 through stage 4 chronic kidney disease, or unspecified chronic kidney disease: Secondary | ICD-10-CM | POA: Diagnosis not present

## 2024-02-23 DIAGNOSIS — I69351 Hemiplegia and hemiparesis following cerebral infarction affecting right dominant side: Secondary | ICD-10-CM | POA: Diagnosis not present

## 2024-02-23 DIAGNOSIS — I69322 Dysarthria following cerebral infarction: Secondary | ICD-10-CM | POA: Diagnosis not present

## 2024-02-23 DIAGNOSIS — E1151 Type 2 diabetes mellitus with diabetic peripheral angiopathy without gangrene: Secondary | ICD-10-CM | POA: Diagnosis not present

## 2024-02-23 DIAGNOSIS — I69392 Facial weakness following cerebral infarction: Secondary | ICD-10-CM | POA: Diagnosis not present

## 2024-02-23 DIAGNOSIS — E1122 Type 2 diabetes mellitus with diabetic chronic kidney disease: Secondary | ICD-10-CM | POA: Diagnosis not present

## 2024-02-25 DIAGNOSIS — E1151 Type 2 diabetes mellitus with diabetic peripheral angiopathy without gangrene: Secondary | ICD-10-CM | POA: Diagnosis not present

## 2024-02-25 DIAGNOSIS — I69392 Facial weakness following cerebral infarction: Secondary | ICD-10-CM | POA: Diagnosis not present

## 2024-02-25 DIAGNOSIS — I69351 Hemiplegia and hemiparesis following cerebral infarction affecting right dominant side: Secondary | ICD-10-CM | POA: Diagnosis not present

## 2024-02-25 DIAGNOSIS — I69322 Dysarthria following cerebral infarction: Secondary | ICD-10-CM | POA: Diagnosis not present

## 2024-02-25 DIAGNOSIS — E1122 Type 2 diabetes mellitus with diabetic chronic kidney disease: Secondary | ICD-10-CM | POA: Diagnosis not present

## 2024-02-25 DIAGNOSIS — L89312 Pressure ulcer of right buttock, stage 2: Secondary | ICD-10-CM | POA: Diagnosis not present

## 2024-02-25 DIAGNOSIS — I5022 Chronic systolic (congestive) heart failure: Secondary | ICD-10-CM | POA: Diagnosis not present

## 2024-02-25 DIAGNOSIS — I130 Hypertensive heart and chronic kidney disease with heart failure and stage 1 through stage 4 chronic kidney disease, or unspecified chronic kidney disease: Secondary | ICD-10-CM | POA: Diagnosis not present

## 2024-02-25 DIAGNOSIS — J449 Chronic obstructive pulmonary disease, unspecified: Secondary | ICD-10-CM | POA: Diagnosis not present

## 2024-03-01 DIAGNOSIS — E1151 Type 2 diabetes mellitus with diabetic peripheral angiopathy without gangrene: Secondary | ICD-10-CM | POA: Diagnosis not present

## 2024-03-01 DIAGNOSIS — I69351 Hemiplegia and hemiparesis following cerebral infarction affecting right dominant side: Secondary | ICD-10-CM | POA: Diagnosis not present

## 2024-03-01 DIAGNOSIS — E1122 Type 2 diabetes mellitus with diabetic chronic kidney disease: Secondary | ICD-10-CM | POA: Diagnosis not present

## 2024-03-01 DIAGNOSIS — I69392 Facial weakness following cerebral infarction: Secondary | ICD-10-CM | POA: Diagnosis not present

## 2024-03-01 DIAGNOSIS — I69322 Dysarthria following cerebral infarction: Secondary | ICD-10-CM | POA: Diagnosis not present

## 2024-03-01 DIAGNOSIS — I5022 Chronic systolic (congestive) heart failure: Secondary | ICD-10-CM | POA: Diagnosis not present

## 2024-03-01 DIAGNOSIS — L89312 Pressure ulcer of right buttock, stage 2: Secondary | ICD-10-CM | POA: Diagnosis not present

## 2024-03-01 DIAGNOSIS — I130 Hypertensive heart and chronic kidney disease with heart failure and stage 1 through stage 4 chronic kidney disease, or unspecified chronic kidney disease: Secondary | ICD-10-CM | POA: Diagnosis not present

## 2024-03-01 DIAGNOSIS — J449 Chronic obstructive pulmonary disease, unspecified: Secondary | ICD-10-CM | POA: Diagnosis not present

## 2024-03-02 ENCOUNTER — Other Ambulatory Visit: Payer: Self-pay | Admitting: Cardiology

## 2024-03-02 DIAGNOSIS — E1122 Type 2 diabetes mellitus with diabetic chronic kidney disease: Secondary | ICD-10-CM | POA: Diagnosis not present

## 2024-03-02 DIAGNOSIS — I130 Hypertensive heart and chronic kidney disease with heart failure and stage 1 through stage 4 chronic kidney disease, or unspecified chronic kidney disease: Secondary | ICD-10-CM | POA: Diagnosis not present

## 2024-03-02 DIAGNOSIS — I5022 Chronic systolic (congestive) heart failure: Secondary | ICD-10-CM | POA: Diagnosis not present

## 2024-03-02 DIAGNOSIS — J449 Chronic obstructive pulmonary disease, unspecified: Secondary | ICD-10-CM | POA: Diagnosis not present

## 2024-03-02 DIAGNOSIS — I69392 Facial weakness following cerebral infarction: Secondary | ICD-10-CM | POA: Diagnosis not present

## 2024-03-02 DIAGNOSIS — I69351 Hemiplegia and hemiparesis following cerebral infarction affecting right dominant side: Secondary | ICD-10-CM | POA: Diagnosis not present

## 2024-03-02 DIAGNOSIS — I69322 Dysarthria following cerebral infarction: Secondary | ICD-10-CM | POA: Diagnosis not present

## 2024-03-02 DIAGNOSIS — E1151 Type 2 diabetes mellitus with diabetic peripheral angiopathy without gangrene: Secondary | ICD-10-CM | POA: Diagnosis not present

## 2024-03-02 DIAGNOSIS — L89312 Pressure ulcer of right buttock, stage 2: Secondary | ICD-10-CM | POA: Diagnosis not present

## 2024-03-03 DIAGNOSIS — E1122 Type 2 diabetes mellitus with diabetic chronic kidney disease: Secondary | ICD-10-CM | POA: Diagnosis not present

## 2024-03-03 DIAGNOSIS — L89312 Pressure ulcer of right buttock, stage 2: Secondary | ICD-10-CM | POA: Diagnosis not present

## 2024-03-03 DIAGNOSIS — I69322 Dysarthria following cerebral infarction: Secondary | ICD-10-CM | POA: Diagnosis not present

## 2024-03-03 DIAGNOSIS — I69392 Facial weakness following cerebral infarction: Secondary | ICD-10-CM | POA: Diagnosis not present

## 2024-03-03 DIAGNOSIS — E1151 Type 2 diabetes mellitus with diabetic peripheral angiopathy without gangrene: Secondary | ICD-10-CM | POA: Diagnosis not present

## 2024-03-03 DIAGNOSIS — I130 Hypertensive heart and chronic kidney disease with heart failure and stage 1 through stage 4 chronic kidney disease, or unspecified chronic kidney disease: Secondary | ICD-10-CM | POA: Diagnosis not present

## 2024-03-03 DIAGNOSIS — J449 Chronic obstructive pulmonary disease, unspecified: Secondary | ICD-10-CM | POA: Diagnosis not present

## 2024-03-03 DIAGNOSIS — I5022 Chronic systolic (congestive) heart failure: Secondary | ICD-10-CM | POA: Diagnosis not present

## 2024-03-03 DIAGNOSIS — I69351 Hemiplegia and hemiparesis following cerebral infarction affecting right dominant side: Secondary | ICD-10-CM | POA: Diagnosis not present

## 2024-03-07 ENCOUNTER — Other Ambulatory Visit: Payer: Self-pay

## 2024-03-07 ENCOUNTER — Emergency Department (HOSPITAL_COMMUNITY)

## 2024-03-07 ENCOUNTER — Observation Stay (HOSPITAL_COMMUNITY)
Admission: EM | Admit: 2024-03-07 | Discharge: 2024-03-09 | Disposition: A | Discharge status: Home / Self Care | Attending: Family Medicine | Admitting: Family Medicine

## 2024-03-07 ENCOUNTER — Encounter (HOSPITAL_COMMUNITY): Payer: Self-pay | Admitting: Family Medicine

## 2024-03-07 ENCOUNTER — Telehealth: Payer: Self-pay

## 2024-03-07 DIAGNOSIS — N1831 Chronic kidney disease, stage 3a: Secondary | ICD-10-CM | POA: Diagnosis not present

## 2024-03-07 DIAGNOSIS — I480 Paroxysmal atrial fibrillation: Secondary | ICD-10-CM | POA: Diagnosis not present

## 2024-03-07 DIAGNOSIS — Z1152 Encounter for screening for COVID-19: Secondary | ICD-10-CM | POA: Insufficient documentation

## 2024-03-07 DIAGNOSIS — I2510 Atherosclerotic heart disease of native coronary artery without angina pectoris: Secondary | ICD-10-CM | POA: Insufficient documentation

## 2024-03-07 DIAGNOSIS — G40909 Epilepsy, unspecified, not intractable, without status epilepticus: Secondary | ICD-10-CM | POA: Insufficient documentation

## 2024-03-07 DIAGNOSIS — I739 Peripheral vascular disease, unspecified: Secondary | ICD-10-CM | POA: Diagnosis not present

## 2024-03-07 DIAGNOSIS — J9621 Acute and chronic respiratory failure with hypoxia: Principal | ICD-10-CM | POA: Insufficient documentation

## 2024-03-07 DIAGNOSIS — G4700 Insomnia, unspecified: Secondary | ICD-10-CM | POA: Insufficient documentation

## 2024-03-07 DIAGNOSIS — J9601 Acute respiratory failure with hypoxia: Secondary | ICD-10-CM | POA: Diagnosis present

## 2024-03-07 DIAGNOSIS — J9811 Atelectasis: Secondary | ICD-10-CM | POA: Diagnosis not present

## 2024-03-07 DIAGNOSIS — I69351 Hemiplegia and hemiparesis following cerebral infarction affecting right dominant side: Secondary | ICD-10-CM | POA: Diagnosis not present

## 2024-03-07 DIAGNOSIS — Z7189 Other specified counseling: Secondary | ICD-10-CM | POA: Diagnosis not present

## 2024-03-07 DIAGNOSIS — I6782 Cerebral ischemia: Secondary | ICD-10-CM | POA: Diagnosis not present

## 2024-03-07 DIAGNOSIS — R0902 Hypoxemia: Secondary | ICD-10-CM | POA: Diagnosis not present

## 2024-03-07 DIAGNOSIS — R531 Weakness: Secondary | ICD-10-CM

## 2024-03-07 DIAGNOSIS — Z79899 Other long term (current) drug therapy: Secondary | ICD-10-CM | POA: Diagnosis not present

## 2024-03-07 DIAGNOSIS — J449 Chronic obstructive pulmonary disease, unspecified: Secondary | ICD-10-CM | POA: Diagnosis not present

## 2024-03-07 DIAGNOSIS — I5022 Chronic systolic (congestive) heart failure: Secondary | ICD-10-CM | POA: Diagnosis not present

## 2024-03-07 DIAGNOSIS — I5032 Chronic diastolic (congestive) heart failure: Secondary | ICD-10-CM | POA: Diagnosis not present

## 2024-03-07 DIAGNOSIS — R739 Hyperglycemia, unspecified: Secondary | ICD-10-CM | POA: Diagnosis not present

## 2024-03-07 DIAGNOSIS — I130 Hypertensive heart and chronic kidney disease with heart failure and stage 1 through stage 4 chronic kidney disease, or unspecified chronic kidney disease: Secondary | ICD-10-CM | POA: Insufficient documentation

## 2024-03-07 DIAGNOSIS — Z789 Other specified health status: Secondary | ICD-10-CM

## 2024-03-07 DIAGNOSIS — I69322 Dysarthria following cerebral infarction: Secondary | ICD-10-CM | POA: Diagnosis not present

## 2024-03-07 DIAGNOSIS — J9612 Chronic respiratory failure with hypercapnia: Secondary | ICD-10-CM | POA: Diagnosis present

## 2024-03-07 DIAGNOSIS — I5030 Unspecified diastolic (congestive) heart failure: Secondary | ICD-10-CM | POA: Diagnosis present

## 2024-03-07 DIAGNOSIS — E1122 Type 2 diabetes mellitus with diabetic chronic kidney disease: Secondary | ICD-10-CM | POA: Insufficient documentation

## 2024-03-07 DIAGNOSIS — E785 Hyperlipidemia, unspecified: Secondary | ICD-10-CM | POA: Insufficient documentation

## 2024-03-07 DIAGNOSIS — K219 Gastro-esophageal reflux disease without esophagitis: Secondary | ICD-10-CM | POA: Diagnosis not present

## 2024-03-07 DIAGNOSIS — I517 Cardiomegaly: Secondary | ICD-10-CM | POA: Diagnosis not present

## 2024-03-07 DIAGNOSIS — J441 Chronic obstructive pulmonary disease with (acute) exacerbation: Secondary | ICD-10-CM | POA: Diagnosis not present

## 2024-03-07 DIAGNOSIS — L89312 Pressure ulcer of right buttock, stage 2: Secondary | ICD-10-CM | POA: Diagnosis not present

## 2024-03-07 DIAGNOSIS — E1151 Type 2 diabetes mellitus with diabetic peripheral angiopathy without gangrene: Secondary | ICD-10-CM | POA: Diagnosis not present

## 2024-03-07 DIAGNOSIS — E119 Type 2 diabetes mellitus without complications: Secondary | ICD-10-CM

## 2024-03-07 DIAGNOSIS — J9622 Acute and chronic respiratory failure with hypercapnia: Secondary | ICD-10-CM | POA: Diagnosis not present

## 2024-03-07 DIAGNOSIS — R000 Tachycardia, unspecified: Secondary | ICD-10-CM | POA: Diagnosis not present

## 2024-03-07 DIAGNOSIS — K5900 Constipation, unspecified: Secondary | ICD-10-CM | POA: Insufficient documentation

## 2024-03-07 DIAGNOSIS — I771 Stricture of artery: Secondary | ICD-10-CM | POA: Diagnosis not present

## 2024-03-07 DIAGNOSIS — I69392 Facial weakness following cerebral infarction: Secondary | ICD-10-CM | POA: Diagnosis not present

## 2024-03-07 LAB — COMPREHENSIVE METABOLIC PANEL WITH GFR
ALT: 16 U/L (ref 0–44)
AST: 23 U/L (ref 15–41)
Albumin: 3.6 g/dL (ref 3.5–5.0)
Alkaline Phosphatase: 58 U/L (ref 38–126)
Anion gap: 10 (ref 5–15)
BUN: 20 mg/dL (ref 8–23)
CO2: 26 mmol/L (ref 22–32)
Calcium: 9.4 mg/dL (ref 8.9–10.3)
Chloride: 107 mmol/L (ref 98–111)
Creatinine, Ser: 1.54 mg/dL — ABNORMAL HIGH (ref 0.61–1.24)
GFR, Estimated: 48 mL/min — ABNORMAL LOW (ref >60)
Glucose, Bld: 221 mg/dL — ABNORMAL HIGH (ref 70–99)
Potassium: 3.8 mmol/L (ref 3.5–5.1)
Sodium: 143 mmol/L (ref 135–145)
Total Bilirubin: 0.6 mg/dL (ref 0.0–1.2)
Total Protein: 7.6 g/dL (ref 6.5–8.1)

## 2024-03-07 LAB — CBC
HCT: 42.9 % (ref 39.0–52.0)
Hemoglobin: 13.3 g/dL (ref 13.0–17.0)
MCH: 30.1 pg (ref 26.0–34.0)
MCHC: 31 g/dL (ref 30.0–36.0)
MCV: 97.1 fL (ref 80.0–100.0)
Platelets: 185 10*3/uL (ref 150–400)
RBC: 4.42 MIL/uL (ref 4.22–5.81)
RDW: 16.5 % — ABNORMAL HIGH (ref 11.5–15.5)
WBC: 7.3 10*3/uL (ref 4.0–10.5)
nRBC: 0 % (ref 0.0–0.2)

## 2024-03-07 LAB — I-STAT CHEM 8, ED
BUN: 23 mg/dL (ref 8–23)
Calcium, Ion: 1.17 mmol/L (ref 1.15–1.40)
Chloride: 107 mmol/L (ref 98–111)
Creatinine, Ser: 1.5 mg/dL — ABNORMAL HIGH (ref 0.61–1.24)
Glucose, Bld: 227 mg/dL — ABNORMAL HIGH (ref 70–99)
HCT: 42 % (ref 39.0–52.0)
Hemoglobin: 14.3 g/dL (ref 13.0–17.0)
Potassium: 4.1 mmol/L (ref 3.5–5.1)
Sodium: 145 mmol/L (ref 135–145)
TCO2: 27 mmol/L (ref 22–32)

## 2024-03-07 LAB — TROPONIN I (HIGH SENSITIVITY)
Troponin I (High Sensitivity): 21 ng/L — ABNORMAL HIGH (ref <18)
Troponin I (High Sensitivity): 29 ng/L — ABNORMAL HIGH (ref <18)
Troponin I (High Sensitivity): 25 ng/L — ABNORMAL HIGH (ref <18)
Troponin I (High Sensitivity): 20 ng/L — ABNORMAL HIGH (ref <18)

## 2024-03-07 LAB — BLOOD GAS, VENOUS
Acid-Base Excess: 4.4 mmol/L — ABNORMAL HIGH (ref 0.0–2.0)
Bicarbonate: 29.2 mmol/L — ABNORMAL HIGH (ref 20.0–28.0)
Drawn by: 3845
O2 Saturation: 86 %
Patient temperature: 37
pCO2, Ven: 43 mmHg — ABNORMAL LOW (ref 44–60)
pH, Ven: 7.44 — ABNORMAL HIGH (ref 7.25–7.43)
pO2, Ven: 54 mmHg — ABNORMAL HIGH (ref 32–45)

## 2024-03-07 LAB — HEMOGLOBIN A1C
Hgb A1c MFr Bld: 5.8 % — ABNORMAL HIGH (ref 4.8–5.6)
Mean Plasma Glucose: 120 mg/dL

## 2024-03-07 LAB — DIFFERENTIAL
Abs Immature Granulocytes: 0.04 10*3/uL (ref 0.00–0.07)
Basophils Absolute: 0 10*3/uL (ref 0.0–0.1)
Basophils Relative: 1 %
Eosinophils Absolute: 0.1 10*3/uL (ref 0.0–0.5)
Eosinophils Relative: 2 %
Immature Granulocytes: 1 %
Lymphocytes Relative: 21 %
Lymphs Abs: 1.5 10*3/uL (ref 0.7–4.0)
Monocytes Absolute: 0.8 10*3/uL (ref 0.1–1.0)
Monocytes Relative: 11 %
Neutro Abs: 4.8 10*3/uL (ref 1.7–7.7)
Neutrophils Relative %: 64 %

## 2024-03-07 LAB — RAPID URINE DRUG SCREEN, HOSP PERFORMED
Amphetamines: NOT DETECTED
Barbiturates: NOT DETECTED
Benzodiazepines: NOT DETECTED
Cocaine: NOT DETECTED
Opiates: NOT DETECTED
Tetrahydrocannabinol: NOT DETECTED

## 2024-03-07 LAB — BRAIN NATRIURETIC PEPTIDE: B Natriuretic Peptide: 227 pg/mL — ABNORMAL HIGH (ref 0.0–100.0)

## 2024-03-07 LAB — ETHANOL: Alcohol, Ethyl (B): <15 mg/dL (ref <15)

## 2024-03-07 LAB — RESP PANEL BY RT-PCR (RSV, FLU A&B, COVID)  RVPGX2
Influenza A by PCR: NEGATIVE
Influenza B by PCR: NEGATIVE
Resp Syncytial Virus by PCR: NEGATIVE
SARS Coronavirus 2 by RT PCR: NEGATIVE

## 2024-03-07 LAB — GLUCOSE, CAPILLARY: Glucose-Capillary: 280 mg/dL — ABNORMAL HIGH (ref 70–99)

## 2024-03-07 MED ORDER — GABAPENTIN 300 MG PO CAPS
600.0000 mg | ORAL_CAPSULE | Freq: Every day | ORAL | Status: DC
  Administered 2024-03-07 – 2024-03-08 (×2): 600 mg via ORAL
  Filled 2024-03-07 (×2): qty 2

## 2024-03-07 MED ORDER — ORAL CARE MOUTH RINSE: 15.0000 mL | OROMUCOSAL | Status: DC | PRN

## 2024-03-07 MED ORDER — INSULIN ASPART 100 UNIT/ML IJ SOLN
0.0000 [IU] | Freq: Three times a day (TID) | INTRAMUSCULAR | Status: DC
  Administered 2024-03-08: 5 [IU] via SUBCUTANEOUS
  Administered 2024-03-08: 3 [IU] via SUBCUTANEOUS
  Administered 2024-03-08: 2 [IU] via SUBCUTANEOUS

## 2024-03-07 MED ORDER — CARVEDILOL 6.25 MG PO TABS: 6.2500 mg | ORAL_TABLET | Freq: Two times a day (BID) | ORAL | Status: DC

## 2024-03-07 MED ORDER — ISOSORBIDE MONONITRATE ER 30 MG PO TB24: 30.0000 mg | ORAL_TABLET | Freq: Every day | ORAL | Status: DC

## 2024-03-07 MED ORDER — PANTOPRAZOLE SODIUM 40 MG PO TBEC
40.0000 mg | DELAYED_RELEASE_TABLET | Freq: Every day | ORAL | Status: DC
  Administered 2024-03-08 – 2024-03-09 (×2): 40 mg via ORAL
  Filled 2024-03-07 (×2): qty 1

## 2024-03-07 MED ORDER — INSULIN GLARGINE-YFGN 100 UNIT/ML ~~LOC~~ SOLN
5.0000 [IU] | Freq: Every day | SUBCUTANEOUS | Status: DC
  Administered 2024-03-08 – 2024-03-09 (×2): 5 [IU] via SUBCUTANEOUS
  Filled 2024-03-07 (×2): qty 0.05

## 2024-03-07 MED ORDER — APIXABAN 5 MG PO TABS
5.0000 mg | ORAL_TABLET | Freq: Two times a day (BID) | ORAL | Status: DC
  Administered 2024-03-07 – 2024-03-09 (×4): 5 mg via ORAL
  Filled 2024-03-07 (×4): qty 1

## 2024-03-07 MED ORDER — AMLODIPINE BESYLATE 5 MG PO TABS: 5.0000 mg | ORAL_TABLET | Freq: Every day | ORAL | Status: DC

## 2024-03-07 MED ORDER — LEVETIRACETAM 500 MG PO TABS
500.0000 mg | ORAL_TABLET | Freq: Two times a day (BID) | ORAL | Status: DC
  Administered 2024-03-07 – 2024-03-09 (×4): 500 mg via ORAL
  Filled 2024-03-07 (×4): qty 1

## 2024-03-07 MED ORDER — ONDANSETRON HCL 4 MG/2ML IJ SOLN: 4.0000 mg | Freq: Four times a day (QID) | INTRAMUSCULAR | Status: DC | PRN

## 2024-03-07 MED ORDER — MIRTAZAPINE 15 MG PO TABS
7.5000 mg | ORAL_TABLET | Freq: Every day | ORAL | Status: DC
  Administered 2024-03-07 – 2024-03-08 (×2): 7.5 mg via ORAL
  Filled 2024-03-07 (×2): qty 1

## 2024-03-07 MED ORDER — IPRATROPIUM-ALBUTEROL 0.5-2.5 (3) MG/3ML IN SOLN
3.0000 mL | Freq: Four times a day (QID) | RESPIRATORY_TRACT | Status: AC
  Administered 2024-03-07 – 2024-03-08 (×2): 3 mL via RESPIRATORY_TRACT
  Filled 2024-03-07 (×2): qty 3

## 2024-03-07 MED ORDER — HYDRALAZINE HCL 25 MG PO TABS: 25.0000 mg | ORAL_TABLET | Freq: Three times a day (TID) | ORAL | Status: DC

## 2024-03-07 MED ORDER — POLYETHYLENE GLYCOL 3350 17 G PO PACK: 17.0000 g | PACK | Freq: Two times a day (BID) | ORAL | Status: DC | PRN

## 2024-03-07 MED ORDER — IPRATROPIUM-ALBUTEROL 0.5-2.5 (3) MG/3ML IN SOLN
3.0000 mL | Freq: Once | RESPIRATORY_TRACT | Status: AC
  Administered 2024-03-07: 3 mL via RESPIRATORY_TRACT
  Filled 2024-03-07: qty 3

## 2024-03-07 MED ORDER — GUAIFENESIN ER 600 MG PO TB12
600.0000 mg | ORAL_TABLET | Freq: Two times a day (BID) | ORAL | Status: DC
  Administered 2024-03-07 – 2024-03-09 (×4): 600 mg via ORAL
  Filled 2024-03-07 (×4): qty 1

## 2024-03-07 MED ORDER — ROSUVASTATIN CALCIUM 5 MG PO TABS
10.0000 mg | ORAL_TABLET | Freq: Every day | ORAL | Status: DC
  Administered 2024-03-07 – 2024-03-08 (×2): 10 mg via ORAL
  Filled 2024-03-07 (×2): qty 2

## 2024-03-07 MED ORDER — METHYLPREDNISOLONE SODIUM SUCC 125 MG IJ SOLR
125.0000 mg | Freq: Once | INTRAMUSCULAR | Status: AC
  Administered 2024-03-07: 125 mg via INTRAVENOUS
  Filled 2024-03-07: qty 2

## 2024-03-07 MED ORDER — ONDANSETRON HCL 4 MG PO TABS: 4.0000 mg | ORAL_TABLET | Freq: Four times a day (QID) | ORAL | Status: DC | PRN

## 2024-03-07 MED ORDER — ACETAMINOPHEN 325 MG PO TABS: 650.0000 mg | ORAL_TABLET | ORAL | Status: DC | PRN

## 2024-03-07 NOTE — Assessment & Plan Note (Addendum)
 Likely secondary to COPD exacerbation.  Breathing comfortably and satting 92% on 7 L.  COVID flu negative.  Less likely to be CHF exacerbation as patient does not appear volume overloaded. Admit to FMTS, attending Dr. Lorie Rook, Vital signs with pulse ox per floor Regular diet  PT/OT to treat Start azithromycin  500 once daily for 3 doses (5/13-5/15) Start prednisone  40 mg once daily for 5 doses (5/13-5/17) Start DuoNebs scheduled every 6 hours, reassess in AM Continue home guanfacine Keep oxygen between 88 to 92%, wean to 5 L AM CBC/BMP  Fall precautions

## 2024-03-07 NOTE — Hospital Course (Addendum)
 Kyle [name] is a 73 y.o.male with a history of COPD on up to 5 L at home, HFpEF, CKD 3A, insulin -dependent type 2 DM  who was admitted to the [facility] at [facility] for SOB and increased oxygen demand. Her hospital course is detailed below:  Acute hypoxemic on chronic hypercapnic respiratory failure Shortness of breath requiring more than regular 5 L at home.  Wheezing on exam but no cough, upper story symptoms, sick contacts, change in medications, volume overload.  BNP 227.  Chest x-ray negative for consolidation or pulm edema.  Treat as COPD exacerbation with azithromycin , prednisone , and DuoNebs.  Prior to discharge, saturating >90% on 2L and discussed with patient keeping between 88-92% given COPD and that he doesn't always need 5L if he follows his pulse ox.  R sided weakness Chronic right-sided weakness but questionable new right facial droop. 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 Pt and wife are unaware of changes and thus no last known normal.  MRI is negative for acute stroke or other acute changes.  Goals of care Discussed code status patient and he was agreeable to concept of DNR but still had considerable questions so continued full code with plan to revisit conversation with wife present per patient request.   Other chronic conditions were medically managed with home medications and formulary alternatives as necessary (insulin -dependent type 2 DM, CKD 3A, HFpEF, hypertension, paroxysmal A-fib, CAD, pain, insomnia, GERD, constipation, hyperlipidemia, epilepsy)  PCP Follow-up Recommendations: Appointment to discuss/update patients advanced directives Continue to emphasis pt taking his oxygen saturation to guide oxygen amount to avoid overoxygenating with COPD

## 2024-03-07 NOTE — ED Triage Notes (Addendum)
 BIB [facility] from home, patient woke up around 0500 this morning and felt weaker on the right side then normal and couldn't get his balance. Patient states that he sounds more slurred than normal. Physical therapist called stating that patients O2 was in the 70s but with EMS he was at 97% on %L [place] which is baseline for patient. Patient has a history of head bleed in November 2024

## 2024-03-07 NOTE — Plan of Care (Signed)
 FMTS Brief Progress Note  S: Went bedside with Dr. Bernardino Bridge to see patient.  Patient in good spirits, reports he is doing well.  RN at bedside, notes he was satting in the low 80s previously, but is now satting around 90% on 5 L of O2.   O: BP (!) 140/72 (BP Location: Left Arm)   Pulse 97   Temp 98 F (36.7 C) (Oral)   Resp 18   Ht 5\' 11"  (1.803 m)   Wt 68 kg   SpO2 92%   BMI 20.92 kg/m   General: NAD, conversant Card: RRR, S1/S2, [facility] Resp: Coarse breath sounds and diffuse wheezing Ext: Moving LUE but not RUE, no swelling  A/P: COPD Flare Pt here for COPD flare, reports doing better since arrival to the hospital.  Patient had concern for rapid response, with O2 sats in the low 80s, but upon check with providers in room sats at 90% on 5 L of O2.  Will continue to monitor. - Plans per day team - O2 sat between 88% to 92% - Continue DuoNebs q6h - Orders reviewed. Labs for AM ordered, which was adjusted as needed.   Wilhemena Harbour, MD 03/07/2024, 9:09 PM PGY-3, [facility] Night Resident  Please page [phone] with questions.

## 2024-03-07 NOTE — Telephone Encounter (Signed)
 Received call this morning around 1000 from Gillian Lacrosse- PT assistant with [facility] regarding patient.   She reports that upon assessment, patient was noted to have oxygen saturations in the low 80's. She reports that patient is on 5 L [place].  She also reports weakness. No recent illness. Temp: 99.1.  She states that patient is not experiencing shortness of breath, however, pulse ox is continuing to be in the low 80's.   Advised ED evaluation.   Patient currently in the ED.   Elsie Halo, RN

## 2024-03-07 NOTE — Assessment & Plan Note (Signed)
 CKD3A: Cr appears baseline currently.  HFpEF: Continue carvedilol  Hypertension: Continue amlodipine , hydralazine  Paroxysmal A-fib: Continue apixaban  CAD: Continue Imdur  Pain: Continue gabapentin , Tylenol  Insomnia: Continue mirtazapine  GERD: Continue pantoprazole  Constipation: Continue MiraLAX  twice daily as needed Hyperlipidemia: Continue rosuvastatin  Epilepsy: Continue Keppra 

## 2024-03-07 NOTE — Evaluation (Signed)
 Physical Therapy Evaluation Patient Details Name: Kyle [name] MRN: [id number] DOB: [birth year] Today's Date: 03/07/2024  History of Present Illness  73 yo male presents to [facility] ED on 11/22/23 for hypoxia.Pt also found to be positive for Covid. PMH includes dilated cardiomyopathy, HF, COPD on 4.5L baseline, HTN, PAD, hx of fem pop bypass 2010, aortobifemoral bypass 2016, L thalamic ICH.  Clinical Impression  Pt presents to PT with chronic deficits in R sided strength deficits from CVA, otherwise pt reports being near baseline. Pt reports that at baseline his spouse assists him with bed mobility and sliding board transfers. The pt is dependent for out of bed mobility. Currently the pt requires physical assistance for bed mobility and is without noticeable rightward lean in sitting. PT defers transfer attempts due to hypoxia in sitting, with sats reading at 80%, 77% and 83% on separate digits with portable pulse oximeter. Pt is asymptomatic and appears in no apparent distress. Of note the pt does have a pulse oximetry probe on his L ear, leading this PT to believe that readings from his digits may have been inconsistently reliable in the ED prior to arrival to the floor. RN is made aware of these findings. As the pt appears close to his reported baseline PT recommends discharge home with continued HHPT and support from spouse.      If plan is discharge home, recommend the following: A lot of help with walking and/or transfers;A lot of help with bathing/dressing/bathroom;Assistance with cooking/housework;Assist for transportation;Help with stairs or ramp for entrance   Can travel by private vehicle        Equipment Recommendations None recommended by PT (pt already has a PWC ordered)  Recommendations for Other Services       Functional Status Assessment Patient has had a recent decline in their functional status and demonstrates the ability to make significant improvements in function in a  reasonable and predictable amount of time.     Precautions / Restrictions Precautions Precautions: Fall Recall of Precautions/Restrictions: Intact Precaution/Restrictions Comments: chronic R hemiplegia, monitor SpO2 Restrictions Weight Bearing Restrictions Per Provider Order: No      Mobility  Bed Mobility Overal bed mobility: Needs Assistance Bed Mobility: Supine to Sit, Sit to Supine     Supine to sit: Max assist, HOB elevated Sit to supine: Max assist        Transfers Overall transfer level:  (deferred due to hypoxia on portable SpO2 monitor)                      Ambulation/Gait                  Stairs            Wheelchair Mobility     Tilt Bed    Modified Rankin (Stroke Patients Only)       Balance Overall balance assessment: Needs assistance Sitting-balance support: No upper extremity supported, Feet supported Sitting balance-Leahy Scale: Fair Sitting balance - Comments: initially requiring minA but progresses to CGA without support of UE                                     Pertinent Vitals/Pain Pain Assessment Pain Assessment: Faces Faces Pain Scale: Hurts little more Pain Location: L shoulder Pain Descriptors / Indicators: Grimacing Pain Intervention(s): Monitored during session    Home Living Family/patient expects to be discharged to:: Private  residence Living Arrangements: Spouse/significant other Available Help at Discharge: Family;Available 24 hours/day Type of Home: Apartment Home Access: Level entry       Home Layout: One level Home Equipment: Transport chair;Other (comment) (sliding board, pt reports a custom power wheelchair is being built for him)      Prior Function Prior Level of Function : Needs assist             Mobility Comments: pt's spouse assist him with bed mobility and transfers with sliding board to transport chair. Pt is able to maintain static sitting balance without  support ADLs Comments: assist for ADLs, setup for feeding     Extremity/Trunk Assessment   Upper Extremity Assessment Upper Extremity Assessment: RUE deficits/detail RUE Deficits / Details: pt with flaccid RUE, PT notes lack of DIP flexion of digits 2-4, unable to close hand. Pt also with limited shoulder ROM to ~60 degrees flexion and abductions, pt with pain at this range    Lower Extremity Assessment Lower Extremity Assessment: RLE deficits/detail RLE Deficits / Details: PROM WFL, PT notes clonus at R ankle, mild flexor tone at hamstrings. No AROM noted    Cervical / Trunk Assessment Cervical / Trunk Assessment: Normal  Communication   Communication Communication: Impaired Factors Affecting Communication: Reduced clarity of speech    Cognition Arousal: Alert Behavior During Therapy: WFL for tasks assessed/performed   PT - Cognitive impairments: No apparent impairments                         Following commands: Intact       Cueing Cueing Techniques: Verbal cues     General Comments General comments (skin integrity, edema, etc.): pt on 10L HFNC throughout session, portable pulse ox reading at 88% at rest. With mobility portable pulse oximeter reading at 80%. Pt denies DOE or SOB, appears in NAD. PT defers further mobility due to questionable reliability of pulse ox readings with admission for hypoxia. RN is made aware and suggestion for contiuous pulse ox monitoring provided.    Exercises     Assessment/Plan    PT Assessment Patient needs continued PT services  PT Problem List Decreased strength;Decreased balance;Decreased mobility;Cardiopulmonary status limiting activity       PT Treatment Interventions DME instruction;Functional mobility training;Therapeutic activities;Gait training;Therapeutic exercise;Balance training;Neuromuscular re-education;Patient/family education;Wheelchair mobility training    PT Goals (Current goals can be found in the Care  Plan section)  Acute Rehab PT Goals Patient Stated Goal: to regain strength in R arm PT Goal Formulation: With patient Time For Goal Achievement: 03/21/24 Potential to Achieve Goals: Poor    Frequency Min 2X/week     Co-evaluation               AM-PAC PT "6 Clicks" Mobility  Outcome Measure Help needed turning from your back to your side while in a flat bed without using bedrails?: A Lot Help needed moving from lying on your back to sitting on the side of a flat bed without using bedrails?: A Lot Help needed moving to and from a bed to a chair (including a wheelchair)?: A Lot Help needed standing up from a chair using your arms (e.g., wheelchair or bedside chair)?: A Lot Help needed to walk in hospital room?: Total Help needed climbing 3-5 steps with a railing? : Total 6 Click Score: 10    End of Session Equipment Utilized During Treatment: Oxygen Activity Tolerance: Treatment limited secondary to medical complications (Comment) (hypoxia on portable  pulse ox, questionable accuracy of readings as pt is asymptomatic) Patient left: in bed;with call bell/phone within reach;with bed alarm set Nurse Communication: Mobility status PT Visit Diagnosis: Other abnormalities of gait and mobility (R26.89);Muscle weakness (generalized) (M62.81);Other symptoms and signs involving the nervous system (R29.898)    Time: 1735-1801 PT Time Calculation (min) (ACUTE ONLY): 26 min   Charges:   PT Evaluation $PT Eval Low Complexity: 1 Low   PT General Charges $$ ACUTE PT VISIT: 1 Visit         Rexie Catena, PT, DPT Acute Rehabilitation Office [phone]   Rexie Catena 03/07/2024, 6:19 PM

## 2024-03-07 NOTE — Assessment & Plan Note (Signed)
" >>  ASSESSMENT AND PLAN FOR T2DM (TYPE 2 DIABETES MELLITUS) (HCC) WRITTEN ON 03/07/2024  6:24 PM BY MILLER, EMILY, DO  A1c 6.2  6 months ago. Continue home 5 units long-acting insulin  sensitive SSI CBGs "

## 2024-03-07 NOTE — Assessment & Plan Note (Signed)
 Discussed CODE STATUS patient and he was agreeable to DNR but still had questions so continued full with plan to revisit

## 2024-03-07 NOTE — Assessment & Plan Note (Signed)
 A1c 6.2  6 months ago. Continue home 5 units long-acting insulin  sensitive SSI CBGs

## 2024-03-07 NOTE — Progress Notes (Deleted)
 .       Office Note   History of Present Illness   Kyle [name] is a 73 y.o. (1951/08/14) male who presents for surveillance with history of aortobifemoral bypass graft in 2016.  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  In 2020 he had a right femoral artery pseudoaneurysm repair with right femoral to peroneal artery bypass with greater saphenous vein.  During surveillance in 2022 he was found to have elevated velocities of 437cm/s at the distal anastomosis of his femoral-peroneal bypass.  This was investigated via angiogram but no significant stenosis was found.  It was suggested if his velocities ever significantly increased he may require repeat angiogram versus surgical revision.  Patient was last seen in the hospital status post stroke with imaging demonstrated small pseudoaneurysm at the previous right femoral artery revision site.  Being that his stroke was large, and that he could not be heparinized, we discussed following this in the outpatient setting as he was asymptomatic, without pain in the right groin.  On exam today, Kyle [name] is accompanied by his wife.  He continues to live at a care facility, presenting in a wheelchair with a [employer].  He is working to ambulate again, but is still a heavy assist for standing.  He has severe deficits in the right arm, and right leg.  Denies new onset right groin pain.  Continues to require 4 to 5 L of oxygen at baseline.  Denies symptoms of ischemic rest pain, tissue loss in the feet  No current facility-administered medications for this visit.   Current Outpatient Medications  Medication Sig Dispense Refill   acetaminophen  (TYLENOL ) 325 MG tablet Take 2 tablets (650 mg total) by mouth every 4 (four) hours as needed for mild pain (pain score 1-3).     albuterol  (PROVENTIL ) (2.5 MG/3ML) 0.083% nebulizer solution Take 3 mLs (2.5 mg total) by nebulization every 4 (four) hours as  needed for wheezing or shortness of breath.     amLODipine  (NORVASC ) 5 MG tablet Take 5 mg by mouth at bedtime.     apixaban  (ELIQUIS ) 5 MG TABS tablet TAKE 1 TABLET TWICE DAILY (Patient taking differently: Take 5 mg by mouth in the morning and at bedtime.) 180 tablet 1   carvedilol  (COREG ) 6.25 MG tablet Take 1 tablet (6.25 mg total) by mouth 2 (two) times daily with a meal. 60 tablet 0   DROPLET PEN NEEDLES 32G X 4 MM MISC USE AS DIRECTED WITH INSULIN  300 each 3   Fluticasone -Umeclidin-Vilant (TRELEGY ELLIPTA ) 200-62.5-25 MCG/ACT AEPB INHALE 1 PUFF EVERY DAY (Patient taking differently: Inhale 1 puff into the lungs in the morning.) 180 each 0   gabapentin  (NEURONTIN ) 600 MG tablet Take 1 tablet (600 mg total) by mouth at bedtime. 90 tablet 0   hydrALAZINE  (APRESOLINE ) 25 MG tablet Take 25 mg by mouth in the morning, at noon, and at bedtime.     ipratropium-albuterol  (DUONEB) 0.5-2.5 (3) MG/3ML SOLN Take 3 mLs by nebulization in the morning, at noon, and at bedtime. (Patient not taking: Reported on 01/05/2024)     isosorbide  mononitrate (IMDUR ) 30 MG 24 hr tablet TAKE 1 TABLET EVERY DAY 30 tablet 0   levETIRAcetam  (KEPPRA ) 500 MG tablet Take 1 tablet (500 mg total) by mouth 2 (two) times daily. 180 tablet 0   mirtazapine  (REMERON ) 7.5 MG tablet Take 1 tablet (7.5 mg total) by mouth at bedtime. 90 tablet 0  MUCINEX  600 MG 12 hr tablet Take 600 mg by mouth 2 (two) times daily.     Multiple Vitamins-Minerals (CENTRUM MEN) TABS Take 1 tablet by mouth daily with breakfast.     NOVOLOG  FLEXPEN 100 UNIT/ML FlexPen Inject 0-10 Units into the skin See admin instructions. Inject 0-10 units into the skin three times a day before meals, per Sliding Scale: BGL 0-59 OR 400-1,000 = CALL MD; 60-150 = give nothing; 151-199 = 2 units; 200-249 = 4 units; 250-299 = 6 units; 300-349 = 8 units; 350-399 = 10 units     OXYGEN Inhale 4 L/min into the lungs continuous.     pantoprazole  (PROTONIX ) 40 MG tablet Take 1 tablet  (40 mg total) by mouth daily. 90 tablet 0   polyethylene glycol (MIRALAX  / GLYCOLAX ) 17 g packet Take 17 g by mouth 2 (two) times daily as needed for mild constipation or moderate constipation.     rosuvastatin  (CRESTOR ) 10 MG tablet TAKE 1 TABLET AT BEDTIME 90 tablet 3   TRESIBA  FLEXTOUCH 100 UNIT/ML FlexTouch Pen Inject 5 Units into the skin in the morning.     Facility-Administered Medications Ordered in Other Visits  Medication Dose Route Frequency Provider Last Rate Last Admin   ipratropium-albuterol  (DUONEB) 0.5-2.5 (3) MG/3ML nebulizer solution 3 mL  3 mL Nebulization Once Scott, Erin N, PA-C       methylPREDNISolone  sodium succinate (SOLU-MEDROL ) 125 mg/2 mL injection 125 mg  125 mg Intravenous Once Scott, Erin N, PA-C        REVIEW OF SYSTEMS (negative unless checked):   Cardiac:  []  Chest pain or chest pressure? []  Shortness of breath upon activity? []  Shortness of breath when lying flat? []  Irregular heart rhythm?  Vascular:  []  Pain in calf, thigh, or hip brought on by walking? []  Pain in feet at night that wakes you up from your sleep? []  Blood clot in your veins? []  Leg swelling?  Pulmonary:  []  Oxygen at home? []  Productive cough? []  Wheezing?  Neurologic:  []  Sudden weakness in arms or legs? []  Sudden numbness in arms or legs? []  Sudden onset of difficult speaking or slurred speech? []  Temporary loss of vision in one eye? []  Problems with dizziness?  Gastrointestinal:  []  Blood in stool? []  Vomited blood?  Genitourinary:  []  Burning when urinating? []  Blood in urine?  Psychiatric:  []  Major depression  Hematologic:  []  Bleeding problems? []  Problems with blood clotting?  Dermatologic:  []  Rashes or ulcers?  Constitutional:  []  Fever or chills?  Ear/Nose/Throat:  []  Change in hearing? []  Nose bleeds? []  Sore throat?  Musculoskeletal:  []  Back pain? []  Joint pain? []  Muscle pain?   Physical Examination   There were no vitals filed  for this visit.  There is no height or weight on file to calculate BMI.  General: Severe decline since last seen, bedridden Gait: Not observed HENT: WNL, normocephalic Pulmonary: normal non-labored breathing , without rales, rhonchi,  wheezing Cardiac: regular Abdomen: soft, NT, no masses Skin: without rashes Vascular Exam/Pulses: Multiphasic peroneal signal, multiphasic DP signal Pedal pulse in the right groin, no significant size change on physical exam Extremities: without ischemic changes, without gangrene , without cellulitis; without open wounds;  Musculoskeletal: no muscle wasting or atrophy  Neurologic: Alert and oriented, dysphagia, severe deficits in the right arm and right leg.  Severe neuropathic pain in both. Psychiatric:  The pt has Normal affect.     Medical Decision Making   Kyle [name]  is a 73 y.o. male who presents after recent hospitalization for stroke with imaging demonstrating incidental finding of right groin pseudoaneurysm femoral revision site.  On physical exam, the pseudoaneurysm is palpable, nonpainful.  He has excellent signals in his feet distally. CT angiogram demonstrated no change in pseudoaneurysm size.  I had a long conversation with Kyle [name] regarding the above.  He is a poor surgical candidate due to his comorbidities status post stroke with right-sided deficits, severe pulmonary dysfunction - oxygen requirement 4-5 L, and nonambulatory state.  He is not interested in any surgery if he can avoid it.  Being that the pseudoaneurysm has not changed in size, we discussed continuing to monitor the site rather than moving to surgery.  Both Kyle [name] and his wife are aware that this can increase in size, and may require surgery in the future. Plan will be to see him again in the office in 3 months with right groin pseudoaneurysm ultrasound as well as ABI and right fem peroneal arterial duplex ultrasound.

## 2024-03-07 NOTE — Assessment & Plan Note (Signed)
 Chronic right-sided weakness but questionable acute right facial droop. 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 Pt and wife are unaware of changes and thus no last known normal.  Follow up MRI

## 2024-03-07 NOTE — Progress Notes (Addendum)
  FMTS Attending Admission Note: Kyle Bob, MD  Personal pager:  [phone] FPTS Service Pager:  [phone]   I  have personally seen and examined this patient, reviewed their chart and results. I have discussed this patient with the resident. I will sign resident H&P when available.  Kyle [name] is a pleasant 73 yo M with PMH of COPD, dilated cardiomyoparthy with severe aortic stenosis (ECHO 08/2023 with EF 55-60%, G1DD), atrial fibrillation, intracerebral hemorrhage in Nov 2024, PAD, CKD stage 3b who presented via EMS after hypoxia and worsened R sided weakness was noted with physical therapy this morning. He is on 5 L [place] O2 at home and denies shortness of breath, chest pain, palpitations, cough, fevers, or chills. He denies feeling short of breath with physical therapy this morning. He denies new or worsening weakness. He just notes he was having trouble maintaining his balance this morning, and his wife agrees that he was leaning and off balance on side of the bed but now that his oxygen is back up he is back to his normal self and does not seem different from his baseline. She denies him seeming confused this morning at all.  On exam, heart RRR, lungs CTAB without wheezing, normal WOB on 5L [place] O2. Abd soft. No edema. He was alert and oriented. Strength 0/5 in Right upper extremity and right lower extremity, which he notes is his baseline since his stroke. Strength 4/5 in LLE and normal grip strength and 5/5 strength in left upper extremity. He notes sensation intact in bilateral upper and lower extremities. CN II-XII intact, except for mild R sided mouth facial droop.  Lab work unremarkable. CT head without acute intracranial abnormality. EKG showing sinus tachycardia without ST segment changes or TWI. CXR with mild cardiomegaly, prominent perihilar region and mild interstitial opacities. RVP negative. UDS negative.   1. Acute on chronic hypoxic respiratory failure- ddx includes CHF exacerbation  versus COPD exacerbation. Order BNP. RVP negative Without signs of pneumonia or infiltrate on cXR. Could be COPD exacerbation, s/p solumedrol in ED, will reassess tomorrow for further steroids. Without tachycardia or chest pain making PE unlikely. 2. Balance issues this morning- pt states at baseline, patient's wife also feels like he is back at his baseline. Possibly in setting of hypoxia. CT head negative for acute intracranial bleed/abnormality, and neurologically no new weakness or symptoms currently. PT/OT consult while in hospital. Consider MRI if recurrence or new neurologic symptoms.

## 2024-03-07 NOTE — H&P (Cosign Needed Addendum)
 Hospital Admission History and Physical Service Pager: [phone]  Patient name: Kyle [name] Medical record number: [id number] Date of Birth: Aug 06, 1951 Age: 73 y.o. Gender: male  Primary Care Provider: Genora Kidd, MD Consultants: None Code Status: Full code Preferred Emergency Contact: Spouse, call home phone first [phone], then try cell phone [phone]  Chief Complaint: Shortness of breath  Assessment and Plan: Kyle [name] is a 73 y.o. male with COPD on up to 5 L at home, HFpEF, CKD 3A, insulin -dependent type 2 DM who presents with shortness of breath and increased oxygen demand. Differential for this patient's presentation of this includes COPD exacerbation, HFpEF exacerbation, and pneumonia.  COPD exacerbation is most likely given history of severe COPD, wheezing on exam but patient is not having any obvious upper respiratory symptoms, cough, increased pain production.  HFpEF less likely given no obvious fluid overload except crackles on exam but reassuring chest x-ray, BNP 227, no lower extremity edema, no orthopnea.  Pneumonia was ruled down with normal chest x-ray, no fever, no cough, normal WBC.  Assessment & Plan Acute hypoxic on chronic hypercapnic respiratory failure (HCC) Likely secondary to COPD exacerbation.  Breathing comfortably and satting 92% on 7 L.  COVID flu negative.  Less likely to be CHF exacerbation as patient does not appear volume overloaded. Admit to FMTS, attending Dr. Lorie Rook, Vital signs with pulse ox per floor Regular diet  PT/OT to treat Start azithromycin  500 once daily for 3 doses (5/13-5/15) Start prednisone  40 mg once daily for 5 doses (5/13-5/17) Start DuoNebs scheduled every 6 hours, reassess in AM Continue home guanfacine Keep oxygen between 88 to 92%, wean to 5 L AM CBC/BMP  Fall precautions Right sided weakness Chronic right-sided weakness but questionable acute right facial droop. CT head negative and previous  strokes were hemorrhagic. Pt and wife are unaware of changes and thus no last known normal.  Follow up MRI T2DM (type 2 diabetes mellitus) (HCC) A1c 6.2  6 months ago. Continue home 5 units long-acting insulin  sensitive SSI CBGs Goals of care, counseling/discussion Discussed CODE STATUS patient and he was agreeable to DNR but still had questions so continued full with plan to revisit  Chronic health problem CKD3A: Cr appears baseline currently.  HFpEF: Continue carvedilol  Hypertension: Continue amlodipine , hydralazine  Paroxysmal A-fib: Continue apixaban  CAD: Continue Imdur  Pain: Continue gabapentin , Tylenol  Insomnia: Continue mirtazapine  GERD: Continue pantoprazole  Constipation: Continue MiraLAX  twice daily as needed Hyperlipidemia: Continue rosuvastatin  Epilepsy: Continue Keppra     FEN/GI: Regular diet, peripheral IV x 1 VTE Prophylaxis: Home Eliquis   Disposition: MedSurg  History of Present Illness:  Kyle [name] is a 73 y.o. male presenting with shortness of breathing and increased oxygen demand. Reports that he got all doses of medication. Reports no changes to meds recently. Denies orthopnea, fever, cough, increased sputum production. Denies any sick contacts.  Defers to his wife for further history taking.  In the ED, symptoms consistent with COPD exacerbation. At baseline on 5L. Received duoneb and solumedrol in ED with improvement. Respiratory panel and BNP pending.   Review Of Systems: Per HPI with the following additions:  Review of Systems  Constitutional:  Negative for diaphoresis and fever.  HENT:  Negative for congestion and sore throat.   Respiratory:  Positive for shortness of breath. Negative for cough, sputum production and wheezing.   Cardiovascular:  Negative for chest pain, orthopnea and leg swelling.  Gastrointestinal:  Negative for constipation, diarrhea, nausea and vomiting.  Musculoskeletal:  Negative for back  pain and joint pain.  Skin:  Negative  for rash.  Neurological:  Negative for dizziness.    Pertinent Past Medical History: COPD CKD 3a HFpEF Insulin -dependent T2DM Paroxysmal A-fib Hyperlipidemia Hypertension GERD   Remainder reviewed in history tab.   Pertinent Past Surgical History:    Remainder reviewed in history tab.  Pertinent Social History: Tobacco use: Former smoker 40 pack years Alcohol use: Denies Other Substance use: Denies Lives with spouse  Pertinent Family History: No significant.  Remainder reviewed in history tab.   Important Outpatient Medications: Amlodipine  5 mg once at bedtime Carvedilol  6.25 mg twice daily Hydralazine  25 mg 3 times daily Imdur  30 mg once daily Rosuvastatin  10 mg once at bedtime Mirtazapine  7.5 mg once at bedtime Tresiba  5 units once in the morning Metoprolol  40 mg once daily MiraLAX  twice daily as needed Apixaban  5 mg twice daily Gabapentin  600 mg once at bedtime Keppra  500 mg twice daily Albuterol  nebulizer as needed Trelegy inhaler once daily Mucinex  600 mg twice daily  Remainder reviewed in medication history.   Objective: BP (!) 145/71 (BP Location: Right Arm)   Pulse 89   Temp 98.7 F (37.1 C) (Oral)   Resp 18   Ht 5\' 11"  (1.803 m)   Wt 68 kg   SpO2 92%   BMI 20.92 kg/m  Exam: General: Calm, cooperative, not in acute distress, not ill-appearing. Eyes: No obvious abnormalities.  This collectors. ENTM: Dry mucous membranes including lips. Neck: Supple, no obvious abnormalities. Cardiovascular: Borderline tachycardic, normal rhythm.  Normal heart sounds. Respiratory: No work of breathing on 7 L during interview.  Auscultated anteriorly crackles present in lower lung fields.  Extra wheezes. Gastrointestinal: Soft and nontender. MSK: No obvious abnormalities.  Wearing brace on right lower extremity to stabilize ankle which she has worn for years. Derm: No obvious skin abnormalities. Neuro: alert and orient to person, place, time.  Speech  without aphasia.  No significant dysarthria with complex phrases.  Attention and short-term memory intact.  Right trace facial droop otherwise cranial nerves intact and symmetric bilaterally.  Right upper and lower extremity 0/5 strength.  Left upper extremity 5/5, left lower extremity 4+/5.  Sensation intact and symmetrical throughout.  Coordination intact grossly with rapid altering movement in left hand and finger-to-nose with left hand. Psych: Behavior appropriate to situation.  Labs:  CBC BMET  Recent Labs  Lab 03/07/24 1208 03/07/24 1212  WBC 7.3  --   HGB 13.3 14.3  HCT 42.9 42.0  PLT 185  --    Recent Labs  Lab 03/07/24 1208 03/07/24 1212  NA 143 145  K 3.8 4.1  CL 107 107  CO2 26  --   BUN 20 23  CREATININE 1.54* 1.50*  GLUCOSE 221* 227*  CALCIUM  9.4  --      BNP: 227 UDS: Negative Troponin: 21 COVID flu: Negative  EKG: Some artifact in EKG.  Sinus tachycardia.  LVH.  No obvious ST segment changes.  QTc 483.   Imaging Studies Performed:  Chest x-ray 5/13  impression from Radiologist:  Mild cardiomegaly with findings suggestive of interstitial edema or atypical/viral infection.   My Interpretation: Cardiomegaly, hyperinflated lungs, increased vascular markings, no evidence of obvious pulmonary edema or effusion.   CT head without contrast 5/13 No CT evidence of acute intracranial abnormality.   Encephalomalacia in the left corona radiata and basal ganglia likely reflecting sequelae of prior hemorrhage.   Mild chronic microvascular ischemic changes and parenchymal volume loss. Remote lacunar infarcts in  the right basal ganglia.   Verdell Given, MD 03/07/2024, 5:57 PM PGY-1, [facility], [facility]  FPTS Intern pager: [phone], text pages welcome Secure chat group [facility] Teaching Service   Clem Currier, DO [facility], PGY-2 03/07/24 6:24 PM

## 2024-03-07 NOTE — ED Provider Notes (Signed)
 [place] EMERGENCY DEPARTMENT AT [facility] Provider Note   CSN: [id number] Arrival date & time: 03/07/24  1134     History  Chief Complaint  Patient presents with   Weakness    Kyle [name] is a 73 y.o. male.  73 year old male brought in by EMS.  Patient's wife is at the bedside, when asking patient and his wife why EMS was called today they are unsure, patient states he is feeling well, not in any pain, denies shortness of breath/chest pain.  Spoke with nursing staff, report from EMS was that patient was working with his physical therapist at home today, they noted that his pulse oximetry was in the 70s, they were concerned thus prompting call to EMS.  History of COPD and patient is on 5 L of oxygen at home via nasal cannula at baseline, it is unclear if patient was on oxygen during this physical therapy assessment as information is limited.  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  Patient's wife states that he seemed to be more off balance than usual this morning when she helped him sit up to get ready for the day.  Patient's last known well was around 5 AM per EMS/nursing staff.  Patient ambulates using a wheelchair at home. Denies fevers, increased cough, sputum production, chest pain, shortness of breath, known sick contacts. On Trellegy and Mucinex  for COPD. Poor historian.    Weakness      Home Medications Prior to Admission medications   Medication Sig Start Date End Date Taking? Authorizing Provider  acetaminophen  (TYLENOL ) 325 MG tablet Take 2 tablets (650 mg total) by mouth every 4 (four) hours as needed for mild pain (pain score 1-3). 09/27/23   Love, Renay Carota, PA-C  albuterol  (PROVENTIL ) (2.5 MG/3ML) 0.083% nebulizer solution Take 3 mLs (2.5 mg total) by nebulization every 4  (four) hours as needed for wheezing or shortness of breath. 12/08/23   Unk Garb, DO  amLODipine  (NORVASC ) 5 MG tablet Take 5 mg by mouth at bedtime.    [provider]  apixaban  (ELIQUIS ) 5 MG TABS tablet TAKE 1 TABLET TWICE DAILY Patient taking differently: Take 5 mg by mouth in the morning and at bedtime. 11/17/23   [place], Peter M, MD  carvedilol  (COREG ) 6.25 MG tablet Take 1 tablet (6.25 mg total) by mouth 2 (two) times daily with a meal. 03/02/24   [place], Peter M, MD  DROPLET PEN NEEDLES 32G X 4 MM MISC USE AS DIRECTED WITH INSULIN  01/03/24   Genora Kidd, MD  Fluticasone -Umeclidin-Vilant (TRELEGY ELLIPTA ) 200-62.5-25 MCG/ACT AEPB INHALE 1 PUFF EVERY DAY Patient taking differently: Inhale 1 puff into the lungs in the morning. 11/15/23   Lind Repine, MD  gabapentin  (NEURONTIN ) 600 MG tablet Take 1 tablet (600 mg total) by mouth at bedtime. 01/31/24   Genora Kidd, MD  hydrALAZINE  (APRESOLINE ) 25 MG tablet Take 25 mg by mouth in the morning, at noon, and at bedtime.    [provider]  ipratropium-albuterol  (DUONEB) 0.5-2.5 (3) MG/3ML SOLN Take 3 mLs by nebulization in the morning, at noon, and at bedtime. Patient not taking: Reported on 01/05/2024 12/08/23 01/07/24  Unk Garb, DO  isosorbide  mononitrate (IMDUR ) 30 MG 24 hr tablet TAKE 1 TABLET EVERY DAY 03/02/24   [place], Peter M, MD  levETIRAcetam  (KEPPRA )  500 MG tablet Take 1 tablet (500 mg total) by mouth 2 (two) times daily. 01/11/24   Genora Kidd, MD  mirtazapine  (REMERON ) 7.5 MG tablet Take 1 tablet (7.5 mg total) by mouth at bedtime. 01/31/24 04/30/24  Genora Kidd, MD  MUCINEX  600 MG 12 hr tablet Take 600 mg by mouth 2 (two) times daily.    [provider]  Multiple Vitamins-Minerals (CENTRUM MEN) TABS Take 1 tablet by mouth daily with breakfast.    [provider]  NOVOLOG  FLEXPEN 100 UNIT/ML FlexPen Inject 0-10 Units into the skin See admin instructions. Inject 0-10 units into the skin three  times a day before meals, per Sliding Scale: BGL 0-59 OR 400-1,000 = CALL MD; 60-150 = give nothing; 151-199 = 2 units; 200-249 = 4 units; 250-299 = 6 units; 300-349 = 8 units; 350-399 = 10 units    [provider]  OXYGEN Inhale 4 L/min into the lungs continuous.    [provider]  pantoprazole  (PROTONIX ) 40 MG tablet Take 1 tablet (40 mg total) by mouth daily. 02/03/24   Jagadish, Mayuri, MD  polyethylene glycol (MIRALAX  / GLYCOLAX ) 17 g packet Take 17 g by mouth 2 (two) times daily as needed for mild constipation or moderate constipation.    [provider]  rosuvastatin  (CRESTOR ) 10 MG tablet TAKE 1 TABLET AT BEDTIME 08/30/23   Adine Hoof, MD  TRESIBA  FLEXTOUCH 100 UNIT/ML FlexTouch Pen Inject 5 Units into the skin in the morning.    [provider]      Allergies    Patient has no known allergies.    Review of Systems   Review of Systems  Neurological:  Positive for weakness.    Physical Exam Updated Vital Signs  Vitals:   03/07/24 1149 03/07/24 1159 03/07/24 1200 03/07/24 1328  BP: (!) 147/82  (!) 157/79   Pulse: (!) 108  (!) 54   Resp: 18  19   Temp: 99.5 F (37.5 C)   99.2 F (37.3 C)  TempSrc: Oral   Rectal  SpO2: (!) 88% 90% 91%   Weight:      Height:        Physical Exam Vitals and nursing note reviewed.  HENT:     Head: Normocephalic.  Eyes:     Extraocular Movements: Extraocular movements intact.     Pupils: Pupils are equal, round, and reactive to light.  Cardiovascular:     Rate and Rhythm: Tachycardia present.     Heart sounds: Normal heart sounds.  Pulmonary:     Effort: Pulmonary effort is normal. No respiratory distress.     Breath sounds: Wheezing (diffuse, expiratory) present.  Abdominal:     Palpations: Abdomen is soft.  Musculoskeletal:     Cervical back: Normal range of motion.     Comments: Deficits of R upper/lower extremities, this is his baseline Left hand: grip strength in-tact Left lower  extremity: 4/5 strength against resistance  Neurological:     Mental Status: He is alert and oriented to person, place, and time. Mental status is at baseline.     Comments: Normal finger to nose and rapid alternating movements of left hand, unable to assess right side secondary to longstanding deficits Mild resting facial droop of R side of mouth, however resolves with facial expressions, otherwise expressions are equal and in-tact     ED Results / Procedures / Treatments   Labs (all labs ordered are listed, but only abnormal results are displayed) Labs Reviewed  CBC - Abnormal; Notable for the following components:      Result Value   RDW 16.5 (*)    All other components within normal limits  COMPREHENSIVE METABOLIC PANEL WITH GFR - Abnormal; Notable for the following components:   Glucose, Bld 221 (*)    Creatinine, Ser 1.54 (*)    GFR, Estimated 48 (*)    All other components within normal limits  I-STAT CHEM 8, ED - Abnormal; Notable for the following components:   Creatinine, Ser 1.50 (*)    Glucose, Bld 227 (*)    All other components within normal limits  RESP PANEL BY RT-PCR (RSV, FLU A&B, COVID)  RVPGX2  ETHANOL  DIFFERENTIAL  RAPID URINE DRUG SCREEN, HOSP PERFORMED  BLOOD GAS, VENOUS  BRAIN NATRIURETIC PEPTIDE  TROPONIN I (HIGH SENSITIVITY)  TROPONIN I (HIGH SENSITIVITY)    EKG EKG Interpretation Date/Time:  Tuesday Mar 07 2024 11:49:55 EDT Ventricular Rate:  105 PR Interval:  161 QRS Duration:  83 QT Interval:  365 QTC Calculation: 483 R Axis:   15  Text Interpretation: Sinus tachycardia Supraventricular bigeminy Probable left atrial enlargement Abnormal R-wave progression, early transition LVH with secondary repolarization abnormality Borderline prolonged QT interval Confirmed by Angela Kell [phone]) on 03/07/2024 1:16:36 PM  Radiology DG Chest 2 View Result Date: 03/07/2024 CLINICAL DATA:  variations in O2 sats EXAM: CHEST - 2 VIEW COMPARISON:  January 05, 2024 FINDINGS: Bilateral perihilar interstitial opacities. Subsegmental atelectasis in the left lung base. No pleural effusion or pneumothorax. Mild cardiomegaly. Tortuous aorta. No acute fracture or destructive lesion. IMPRESSION: Mild cardiomegaly with findings suggestive of interstitial edema or atypical/viral infection. Electronically Signed   By: Rance Burrows M.D.   On: 03/07/2024 13:38   CT HEAD WO CONTRAST Result Date: 03/07/2024 CLINICAL DATA:  Neuro deficit, concern for stroke, right-sided weakness more than normal. EXAM: CT HEAD WITHOUT CONTRAST TECHNIQUE: Contiguous axial images were obtained from the base of the skull through the vertex without intravenous contrast. RADIATION DOSE REDUCTION: This exam was performed according to the departmental dose-optimization program which includes automated exposure control, adjustment of the mA and/or kV according to patient size and/or use of iterative reconstruction technique. COMPARISON:  CT head 09/21/2023, MRI head 09/06/2023. FINDINGS: Brain: No acute intracranial hemorrhage. Encephalomalacia involving the left corona radiata extending into the posterior limb of the internal capsule with additional hypoattenuation in the thalamus and left cerebral peduncle. Additional remote lacunar infarcts in the right basal ganglia. Mild parenchymal volume loss. No edema, mass effect, or midline shift. The basilar cisterns are patent. Ventricles: The ventricles are normal. Vascular: Atherosclerotic calcifications of the carotid siphons and intracranial vertebral arteries. No hyperdense vessel. Skull: No acute or aggressive finding. Orbits: Orbits are symmetric. Sinuses: The visualized paranasal sinuses are clear. Other: Mastoid air cells are clear. IMPRESSION: No CT evidence of acute intracranial abnormality. Encephalomalacia in the left corona radiata and basal ganglia likely reflecting sequelae of prior hemorrhage. Mild chronic microvascular ischemic changes and  parenchymal volume loss. 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 Electronically Signed   By: Denny Flack M.D.   On: 03/07/2024 13:01    Procedures .Critical Care  Performed by: Kendrick Pax, PA-C Authorized by: Kendrick Pax, PA-C   Critical care provider statement:    Critical care time (minutes):  35   Critical care was necessary to treat or prevent imminent or life-threatening deterioration of the following conditions:  Respiratory failure   Critical care was time spent personally  by me on the following activities:  Development of treatment plan with patient or surrogate, discussions with consultants, evaluation of patient's response to treatment, examination of patient, ordering and review of laboratory studies, ordering and review of radiographic studies, ordering and performing treatments and interventions, pulse oximetry, re-evaluation of patient's condition and review of old charts   I assumed direction of critical care for this patient from another provider in my specialty: no     Care discussed with: admitting provider       Medications Ordered in ED Medications  ipratropium-albuterol  (DUONEB) 0.5-2.5 (3) MG/3ML nebulizer solution 3 mL (3 mLs Nebulization Given 03/07/24 1418)  methylPREDNISolone  sodium succinate (SOLU-MEDROL ) 125 mg/2 mL injection 125 mg (125 mg Intravenous Given 03/07/24 1415)    ED Course/ Medical Decision Making/ A&P                                 Medical Decision Making This patient presents to the ED for concern of weakness/worsening oxygen requirement, this involves an extensive number of treatment options, and is a complaint that carries with it a high risk of complications and morbidity.  The differential diagnosis includes COPD exacerbation, viral respiratory illness, pneumonia, CVA/TIA.    Co morbidities that complicate the patient evaluation  COPD, prior hemorrhagic stroke with resulting R sided deficits, CHF, T2DM, on  Eliquis    Additional history obtained:  Additional history obtained from record review External records from outside source obtained and reviewed including recent hospital discharge summaries   Lab Tests:  I Ordered, and personally interpreted labs.  The pertinent results include: CMP notable for hyperglycemia with elevated creatinine, however this remains relatively stable as compared to patient's prior kidney function.  CBC unremarkable, no leukocytosis.  Ethanol level negative. Additional labs pending including troponin/BNP/VBG/respiratory panel.    Imaging Studies ordered:  I ordered imaging studies including head CT, chest XR  I independently visualized and interpreted imaging which showed  - head CT: No CT evidence of acute intracranial abnormality. - chest XR: Mild cardiomegaly with findings suggestive of interstitial edema or atypical/viral infection.   I agree with the radiologist interpretation   Cardiac Monitoring: / EKG:  The patient was maintained on a cardiac monitor.  I personally viewed and interpreted the cardiac monitored which showed an underlying rhythm of: sinus tachycardia, see EKG interpretation   Consultations Obtained:  I requested consultation with the family medicine resident,  and discussed lab and imaging findings as well as pertinent plan - they recommend: admission for further evaluation and management of likely COPD exacerbation   Problem List / ED Course / Critical interventions / Medication management   I ordered medication including DuoNeb, Solu-Medrol  for wheezing/worsening oxygen requirement   Reevaluation of the patient after these medicines showed that the patient improved I have reviewed the patients home medicines and have made adjustments as needed   Social Determinants of Health:  History of tobacco use, isolation, ambulates using wheelchair   Test / Admission - Considered:  Physical exam notable for R sided deficits, this  is patient's baseline. Patient is afebrile with core temp of 99.2. Mild diffuse expiratory wheezing noting bilaterally, will initiate DuoNeb and reassess. Patient alternates between tachycardia and normal sinus rhythm during physical exam, patient is initially on 5L [place] (this is his baseline), however O2 saturation was in the low 70s consistently, I increased patient to 8L [place]. Patient was able to be weaned back down to  5L by nursing staff, O2 saturation remained stable in the 80's at that time. Chest XR concerning for cardiomegaly with findings suggestive of interstitial edema or atypical/viral infection, will obtain COVID/Flu/RSV viral panel. Upon reassessment following DuoNeb patient's oxygen saturation has improved to 90's at 5L high flow [place]. Patient with history of COPD with worsening oxygen requirement, will likely require admission for further evaluation and treatment. Spoke with family medicine resident who is in agreement with this plan, will come assess patient in the Emergency Department and admit to their service. Patient and his wife are agreeable with this plan.      Amount and/or Complexity of Data Reviewed Independent Historian: spouse External Data Reviewed: notes.    Details: As above.  Labs: ordered.    Details: As above. Radiology: ordered.    Details: As above. ECG/medicine tests: ordered.    Details: As above.  Risk Prescription drug management. Decision regarding hospitalization.           Final Clinical Impression(s) / ED Diagnoses Final diagnoses:  COPD exacerbation [facility])    Rx / DC Orders ED Discharge Orders     None         Adolm Ahumada 03/07/24 1520    Burnette Carte, MD 03/08/24 [phone]

## 2024-03-07 NOTE — Progress Notes (Signed)
   03/07/24 2020  Vitals  Temp 98 F (36.7 C)  Temp Source Oral  BP (!) 140/72  MAP (mmHg) 92  BP Location Left Arm  BP Method Automatic  Patient Position (if appropriate) Sitting  Pulse Rate 94  Pulse Rate Source Monitor  Resp 18  MEWS COLOR  MEWS Score Color Green  Oxygen Therapy  SpO2 98 %  O2 Device Nasal Cannula  O2 Flow Rate (L/min) 6 L/min  MEWS Score  MEWS Temp 0  MEWS Systolic 0  MEWS Pulse 0  MEWS RR 0  MEWS LOC 0  MEWS Score 0  Provider Notification  Provider Name/Title Dr. Tenna Fees  Date Provider Notified 03/07/24  Time Provider Notified 2028  Method of Notification  (secure chat)  Notification Reason Other (Comment) (O2 deat to 80-82% on 5L/[place], pt denies sob. O2 increased to 6L/[place]. rapid rn at bedside)  Provider response At bedside (no new orders)  Date of Provider Response 03/07/24  Time of Provider Response 2038     Pt is A&O x 4. VSS, on 5L O2/[place], O2 sat desat to 80-82% and o2 increased 6L/[place], pt denies sob. Dr. Tenna Fees notified and updated and MD came at bedside to evaluate. No new orders.   Pt currently on 5L O2/[place] and pt asymptomatic. Safety maintained. Bed alarm on. Call bell in reach. Will continue to monitor.

## 2024-03-07 NOTE — Plan of Care (Signed)

## 2024-03-08 ENCOUNTER — Inpatient Hospital Stay (HOSPITAL_COMMUNITY)

## 2024-03-08 DIAGNOSIS — R29818 Other symptoms and signs involving the nervous system: Secondary | ICD-10-CM | POA: Diagnosis not present

## 2024-03-08 DIAGNOSIS — J9601 Acute respiratory failure with hypoxia: Secondary | ICD-10-CM | POA: Diagnosis not present

## 2024-03-08 DIAGNOSIS — J441 Chronic obstructive pulmonary disease with (acute) exacerbation: Principal | ICD-10-CM | POA: Diagnosis present

## 2024-03-08 DIAGNOSIS — R531 Weakness: Secondary | ICD-10-CM | POA: Diagnosis not present

## 2024-03-08 DIAGNOSIS — J9612 Chronic respiratory failure with hypercapnia: Secondary | ICD-10-CM

## 2024-03-08 LAB — GLUCOSE, CAPILLARY
Glucose-Capillary: 172 mg/dL — ABNORMAL HIGH (ref 70–99)
Glucose-Capillary: 246 mg/dL — ABNORMAL HIGH (ref 70–99)
Glucose-Capillary: 289 mg/dL — ABNORMAL HIGH (ref 70–99)
Glucose-Capillary: 212 mg/dL — ABNORMAL HIGH (ref 70–99)

## 2024-03-08 LAB — BASIC METABOLIC PANEL WITH GFR
Anion gap: 9 (ref 5–15)
BUN: 20 mg/dL (ref 8–23)
CO2: 26 mmol/L (ref 22–32)
Calcium: 9.1 mg/dL (ref 8.9–10.3)
Chloride: 107 mmol/L (ref 98–111)
Creatinine, Ser: 1.4 mg/dL — ABNORMAL HIGH (ref 0.61–1.24)
GFR, Estimated: 53 mL/min — ABNORMAL LOW (ref >60)
Glucose, Bld: 207 mg/dL — ABNORMAL HIGH (ref 70–99)
Potassium: 4.5 mmol/L (ref 3.5–5.1)
Sodium: 142 mmol/L (ref 135–145)

## 2024-03-08 LAB — CBC
HCT: 38.2 % — ABNORMAL LOW (ref 39.0–52.0)
Hemoglobin: 12.3 g/dL — ABNORMAL LOW (ref 13.0–17.0)
MCH: 30.2 pg (ref 26.0–34.0)
MCHC: 32.2 g/dL (ref 30.0–36.0)
MCV: 93.9 fL (ref 80.0–100.0)
Platelets: 166 10*3/uL (ref 150–400)
RBC: 4.07 MIL/uL — ABNORMAL LOW (ref 4.22–5.81)
RDW: 15.9 % — ABNORMAL HIGH (ref 11.5–15.5)
WBC: 5.5 10*3/uL (ref 4.0–10.5)
nRBC: 0 % (ref 0.0–0.2)

## 2024-03-08 MED ORDER — PREDNISONE 20 MG PO TABS
40.0000 mg | ORAL_TABLET | Freq: Every day | ORAL | Status: DC
  Administered 2024-03-08 – 2024-03-09 (×2): 40 mg via ORAL
  Filled 2024-03-08 (×2): qty 2

## 2024-03-08 MED ORDER — IPRATROPIUM-ALBUTEROL 0.5-2.5 (3) MG/3ML IN SOLN
3.0000 mL | Freq: Four times a day (QID) | RESPIRATORY_TRACT | Status: AC
  Administered 2024-03-08: 3 mL via RESPIRATORY_TRACT
  Filled 2024-03-08: qty 3

## 2024-03-08 MED ORDER — IPRATROPIUM-ALBUTEROL 0.5-2.5 (3) MG/3ML IN SOLN: 3.0000 mL | Freq: Four times a day (QID) | RESPIRATORY_TRACT | Status: DC | PRN

## 2024-03-08 MED ORDER — AZITHROMYCIN 500 MG PO TABS
500.0000 mg | ORAL_TABLET | Freq: Every day | ORAL | Status: DC
  Administered 2024-03-08 – 2024-03-09 (×2): 500 mg via ORAL
  Filled 2024-03-08 (×2): qty 1

## 2024-03-08 NOTE — Evaluation (Signed)
 Occupational Therapy Evaluation Patient Details Name: Kyle [name] MRN: [id number] DOB: 01-06-1951 Today's Date: 03/08/2024   History of Present Illness   73 yo male presents to [facility] ED on 11/22/23 for hypoxia. PMH includes dilated cardiomyopathy, HF, COPD on 4.5L baseline, HTN, PAD, hx of fem pop bypass 2010, aortobifemoral bypass 2016, L thalamic ICH.     Clinical Impressions Pt reports having assist at baseline with ADLs, transfers via slide board to at transfer chair with assist from spouse, and has a power w/c currently ordered. Pt was working on standing with HH therapies. Pt currently needing up to max A for ADLs, and mod A for bed mobility and laterally scooting along EOB, but returned to supine as pt needing to use bedpan. Pt with edematous RUE, elevated on pillows at end of session for edema control. Pt presenting with impairments listed below, will follow acutely. Recommend HHOT at d/c.      If plan is discharge home, recommend the following:   A lot of help with walking and/or transfers;A lot of help with bathing/dressing/bathroom;Assistance with cooking/housework;Direct supervision/assist for medications management;Direct supervision/assist for financial management;Assist for transportation;Help with stairs or ramp for entrance     Functional Status Assessment   Patient has had a recent decline in their functional status and demonstrates the ability to make significant improvements in function in a reasonable and predictable amount of time.     Equipment Recommendations   None recommended by OT     Recommendations for Other Services   PT consult     Precautions/Restrictions   Precautions Precautions: Fall Recall of Precautions/Restrictions: Intact Precaution/Restrictions Comments: chronic R hemiplegia, monitor SpO2 Required Braces or Orthoses: Other Brace Other Brace: has RLE AFO in shoe in room Restrictions Weight Bearing Restrictions Per Provider Order:  No     Mobility Bed Mobility Overal bed mobility: Needs Assistance Bed Mobility: Supine to Sit, Sit to Supine     Supine to sit: Mod assist Sit to supine: Mod assist   General bed mobility comments: educated pt on hooking LLE under RLE to self-assist to sit EOB    Transfers Overall transfer level: Needs assistance Equipment used: None Transfers: Bed to chair/wheelchair/BSC            Lateral/Scoot Transfers: Mod assist General transfer comment: lateral scoot simulated at EOB, then returned to supine to get on bedpan      Balance Overall balance assessment: Needs assistance Sitting-balance support: No upper extremity supported, Feet supported Sitting balance-Leahy Scale: Fair Sitting balance - Comments: initially requiring minA but progresses to CGA without support of UE                                   ADL either performed or assessed with clinical judgement   ADL Overall ADL's : Needs assistance/impaired Eating/Feeding: Minimal assistance;Sitting   Grooming: Minimal assistance;Sitting   Upper Body Bathing: Moderate assistance;Sitting   Lower Body Bathing: Maximal assistance;Sitting/lateral leans   Upper Body Dressing : Moderate assistance;Sitting   Lower Body Dressing: Maximal assistance;Sitting/lateral leans   Toilet Transfer: Moderate assistance;Transfer board   Toileting- Clothing Manipulation and Hygiene: Maximal assistance       Functional mobility during ADLs: Moderate assistance       Vision   Vision Assessment?: No apparent visual deficits     Perception Perception: Not tested       Praxis Praxis: Not tested  Pertinent Vitals/Pain Pain Assessment Pain Assessment: No/denies pain     Extremity/Trunk Assessment Upper Extremity Assessment Upper Extremity Assessment: RUE deficits/detail RUE Deficits / Details: 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, able to minimally elevate shoulders, incr tone proximally vs distally RUE  Sensation: WNL RUE Coordination: decreased fine motor;decreased gross motor   Lower Extremity Assessment Lower Extremity Assessment: Defer to PT evaluation   Cervical / Trunk Assessment Cervical / Trunk Assessment: Normal   Communication Communication Communication: No apparent difficulties   Cognition Arousal: Alert Behavior During Therapy: WFL for tasks assessed/performed Cognition: No apparent impairments                               Following commands: Intact       Cueing  General Comments   Cueing Techniques: Verbal cues  VSS on 5L O2 via [place]   Exercises     Shoulder Instructions      Home Living Family/patient expects to be discharged to:: Private residence Living Arrangements: Spouse/significant other Available Help at Discharge: Family;Available 24 hours/day Type of Home: Apartment Home Access: Level entry     Home Layout: One level     Bathroom Shower/Tub: Producer, television/film/video: Standard Bathroom Accessibility: Yes   Home Equipment: Transport chair;Other (comment);Hospital bed (in the process of getting power w/c finsihed, slide board)   Additional Comments: 5L O2 baseline, R hemi since stroke in November, went to [facility] after stroke      Prior Functioning/Environment Prior Level of Function : Needs assist             Mobility Comments: pt's spouse assist him with bed mobility and transfers with sliding board to transport chair. Pt is able to maintain static sitting balance without support ADLs Comments: assist for ADLs, setup for feeding; wears briefs for toileting, bathes and gets dressed seated EOB; plans to use community transportation    OT Problem List: Decreased strength;Decreased activity tolerance;Decreased range of motion;Impaired balance (sitting and/or standing);Decreased safety awareness;Cardiopulmonary status limiting activity;Impaired UE functional use;Impaired tone;Increased edema   OT  Treatment/Interventions: Self-care/ADL training;Therapeutic exercise;Energy conservation;DME and/or AE instruction;Therapeutic activities;Patient/family education;Balance training      OT Goals(Current goals can be found in the care plan section)   Acute Rehab OT Goals Patient Stated Goal: none stated OT Goal Formulation: With patient Time For Goal Achievement: 03/22/24 Potential to Achieve Goals: Good ADL Goals Pt Will Perform Upper Body Dressing: sitting;with min assist Pt Will Perform Lower Body Dressing: sitting/lateral leans;with min assist Pt Will Transfer to Toilet: with min assist;stand pivot transfer;with transfer board;bedside commode Pt/caregiver will Perform Home Exercise Program: Right Upper extremity;With minimal assist;With written HEP provided   OT Frequency:  Min 2X/week    Co-evaluation              AM-PAC OT "6 Clicks" Daily Activity     Outcome Measure Help from another person eating meals?: A Little Help from another person taking care of personal grooming?: A Little Help from another person toileting, which includes using toliet, bedpan, or urinal?: A Lot Help from another person bathing (including washing, rinsing, drying)?: A Lot Help from another person to put on and taking off regular upper body clothing?: A Lot Help from another person to put on and taking off regular lower body clothing?: A Lot 6 Click Score: 14   End of Session Equipment Utilized During Treatment: Gait belt;Oxygen Nurse Communication: Mobility status  Activity Tolerance:  Patient tolerated treatment well Patient left: in bed;with call bell/phone within reach;with bed alarm set  OT Visit Diagnosis: Unsteadiness on feet (R26.81);Other abnormalities of gait and mobility (R26.89);Muscle weakness (generalized) (M62.81);Hemiplegia and hemiparesis Hemiplegia - Right/Left: Right                Time: 1610-9604 OT Time Calculation (min): 30 min Charges:  OT General Charges $OT Visit:  1 Visit OT Evaluation $OT Eval Moderate Complexity: 1 Mod OT Treatments $Self Care/Home Management : 8-22 mins  Fortino Haag K, OTD, OTR/L SecureChat Preferred Acute Rehab [phone]   Antionette Kirks 03/08/2024, 12:37 PM

## 2024-03-08 NOTE — Progress Notes (Signed)
 Daily Progress Note Intern Pager: [phone]  Patient name: Kyle [name] Medical record number: [id number] Date of birth: [birth year] Age: 73 y.o. Gender: male  Primary Care Provider: Genora Kidd, MD Consultants: none Code Status: Full  Pt Overview and Major Events to Date:  Kyle [name] is a 73 y.o. male with COPD on up to 5 L at home, HFpEF, CKD 3A, insulin -dependent type 2 DM who presents with shortness of breath and increased oxygen demand.   Assessment and Plan:  Breathing improved and patient unsure if he is stable for discharge today or not and wants to defer to his wife. Assessment & Plan Acute hypoxic on chronic hypercapnic respiratory failure (HCC) Likely secondary to COPD exacerbation.  Breathing comfortably and satting 92% on 7 L.  COVID flu negative.  exacerbation as patient does not appear volume overloaded. Follow-up PT OT recommendations  Continue azithromycin  500 once daily for 3 doses (5/14-5/16) Continue prednisone  40 mg once daily for 5 doses (5/13-5/17) Continue DuoNebs scheduled every 6 hours and PRN starting tonight Continue home guanfacine Keep oxygen between 88 to 92%, wean to 5 L AM CBC/BMP  Fall precautions Right sided weakness Chronic right-sided weakness but questionable acute right facial droop. 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 Pt and wife are unaware of changes and thus no last known normal.  MRI negative. T2DM (type 2 diabetes mellitus) (HCC) A1c 6.2  6 months ago. Sugar in 200s. Continue home 5 units long-acting insulin  sensitive SSI CBGs Goals of care, counseling/discussion Discussed CODE STATUS patient and he was agreeable to DNR but still had questions so continued full with plan to revisit.  Chronic health problem CKD3A: Cr appears baseline currently.  HFpEF: Continue carvedilol  Hypertension: Continue amlodipine , hydralazine  Paroxysmal A-fib: Continue apixaban  CAD: Continue Imdur  Pain: Continue  gabapentin , Tylenol  Insomnia: Continue mirtazapine  GERD: Continue pantoprazole  Constipation: Continue MiraLAX  twice daily as needed Hyperlipidemia: Continue rosuvastatin  Epilepsy: Continue Keppra     FEN/GI: Regular diet PPx: Apixaban  Dispo:Home tomorrow. Barriers include breathing to baseline.   Subjective:  Patient reports that his breathing is improved today.  Discussed that we could discharge today if he feels improved and give him his medications to go home with.  Reports that he is in bed plan about whether to stay or go home is different to his wife.  Objective: Temp:  [97.7 F (36.5 C)-99.5 F (37.5 C)] 98.7 F (37.1 C) (05/14 0802) Pulse Rate:  [54-108] 70 (05/14 0802) Resp:  [18-29] 18 (05/14 0802) BP: (138-172)/(65-84) 171/74 (05/14 0802) SpO2:  [88 %-100 %] 96 % (05/14 0932) Weight:  [68 kg] 68 kg (05/13 1141) Physical Exam: General: Calm, cooperative, on oxygen, not in acute distress Cardiovascular: Normal rate and rhythm.  1/6 systolic murmur. Respiratory: Normal effort.  Bilateral crackles. Abdomen: Soft and nontender. Extremities: No lower extremity bilaterally. Neuro: Alert and oriented to person place and time.  Attention and memory intact.  Speech without aphasia or dysarthria.  Unchanged from yesterday including 0/5 strength on right upper and lower extremity and 4+ on left upper and lower extremity.  Sensation intact and symmetric bilaterally.  Laboratory: Most recent CBC Lab Results  Component Value Date   WBC 5.5 03/08/2024   HGB 12.3 (L) 03/08/2024   HCT 38.2 (L) 03/08/2024   MCV 93.9 03/08/2024   PLT 166 03/08/2024   Most recent BMP    Latest Ref Rng & Units 03/08/2024    6:21 AM  BMP  Glucose 70 - 99 mg/dL 284  BUN 8 - 23 mg/dL 20   Creatinine 1.61 - 1.24 mg/dL 0.96   Sodium 045 - 409 mmol/L 142   Potassium 3.5 - 5.1 mmol/L 4.5   Chloride 98 - 111 mmol/L 107   CO2 22 - 32 mmol/L 26   Calcium  8.9 - 10.3 mg/dL 9.1      MRI 8/11 1.   No acute intracranial abnormality.   2. Chronic small vessel disease. 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   Verdell Given, MD 03/08/2024, 10:27 AM  PGY-1, [facility] Family Medicine FPTS Intern pager: [phone], text pages welcome Secure chat group [facility] Teaching Service

## 2024-03-08 NOTE — Assessment & Plan Note (Addendum)
 CKD3A: Cr appears baseline currently.  HFpEF: Continue carvedilol  Hypertension: Continue amlodipine , hydralazine  Paroxysmal A-fib: Continue apixaban  CAD: Continue Imdur  Pain: Continue gabapentin , Tylenol  Insomnia: Continue mirtazapine  GERD: Continue pantoprazole  Constipation: Continue MiraLAX  twice daily as needed Hyperlipidemia: Continue rosuvastatin  Epilepsy: Continue Keppra 

## 2024-03-08 NOTE — Progress Notes (Signed)
 Transition of Care [facility]) - Inpatient Brief Assessment   Patient Details  Name: Kyle [name] MRN: [id number] Date of Birth: 03/16/1951  Transition of Care [facility]) CM/SW Contact:    Dane Dung, RN Phone Number: 03/08/2024, 1:18 PM   Clinical Narrative: CM met with the patient at the bedside to discuss TOC needs to return home with his wife when stable for discharge.  Patient was provided with [facility] letter at the bedside.  Code 44 completed.  Patient admitted to the hospital for pulmonary edema and respiratory failure and remains on oxygen in the hospital.  Patient with chronic oxygen use at home at 5L/min per patient.  Patient is normally mobile with a wheelchair only per the patient.  When patient is stable for discharge - likely tomorrow 03/09/24 - I will coordinate PTAR to home.  Patient confirms that he normally goes home by ambulance since he is oxygen dependent and wheelchair bound.  CM will continue to follow the patient for assistance for transportation to home once stable for discharge.   Transition of Care Asessment:   Patient has primary care physician: (P) Yes Home environment has been reviewed: (P) from home with spoue Prior level of function:: (P) WC Prior/Current Home Services: (P) No current home services Social Drivers of Health Review: (P) SDOH reviewed needs interventions Readmission risk has been reviewed: (P) Yes Transition of care needs: (P) no transition of care needs at this time

## 2024-03-08 NOTE — Discharge Instructions (Signed)
 Dear Kyle [name],  Thank you for letting us  participate in your care. You were hospitalized for shortness of breath and diagnosed with Acute hypoxic on chronic hypercapnic respiratory failure (HCC). You were treated with steroids, antibiotics, and bronchodilators.   POST-HOSPITAL & CARE INSTRUCTIONS Take azithromycin  and prednisone  until they are gone Follow up with your primary care physician.  Go to your follow up appointments (listed below)   DOCTOR'S APPOINTMENT   Future Appointments  Date Time Provider Department Center  03/13/2024  1:30 PM ACCESS TO CARE POOL [facility]  03/24/2024  9:00 AM [name], Kyle M, MD [facility]  05/18/2024  3:30 PM [facility] ANNUAL Kyle [name] VISIT [facility]  05/24/2024  1:45 PM Johny Nap, NP [facility] None     Take care and be well!  Family Medicine Teaching Service Inpatient Team [place]  [facility]  [address] [place], [name] [address] [phone]

## 2024-03-08 NOTE — Assessment & Plan Note (Signed)
" >>  ASSESSMENT AND PLAN FOR T2DM (TYPE 2 DIABETES MELLITUS) (HCC) WRITTEN ON 03/08/2024  9:04 AM BY MCCARTY, ARTIE, MD  A1c 6.2  6 months ago. Sugar in 200s. Continue home 5 units long-acting insulin  sensitive SSI CBGs "

## 2024-03-08 NOTE — Assessment & Plan Note (Addendum)
 Likely secondary to COPD exacerbation.  Breathing comfortably and satting 92% on 7 L.  COVID flu negative.  exacerbation as patient does not appear volume overloaded. Follow-up PT OT recommendations  Continue azithromycin  500 once daily for 3 doses (5/14-5/16) Continue prednisone  40 mg once daily for 5 doses (5/13-5/17) Continue DuoNebs scheduled every 6 hours and PRN starting tonight Continue home guanfacine Keep oxygen between 88 to 92%, wean to 5 L AM CBC/BMP  Fall precautions

## 2024-03-08 NOTE — Plan of Care (Signed)
 Spoke with wife on phone. She spoke w/ pt and they plan to discharge tomorrow, they feel he needs some more time to recover. I think this is reasonable and we can continue to monitor respiratory status as we transition to PO steroids.

## 2024-03-08 NOTE — Assessment & Plan Note (Addendum)
 A1c 6.2  6 months ago. Sugar in 200s. Continue home 5 units long-acting insulin  sensitive SSI CBGs

## 2024-03-08 NOTE — Plan of Care (Signed)
  Problem: Skin Integrity: Goal: Risk for impaired skin integrity will decrease Outcome: Progressing   Problem: Safety: Goal: Ability to remain free from injury will improve Outcome: Progressing   Problem: Pain Managment: Goal: General experience of comfort will improve and/or be controlled Outcome: Progressing   Problem: Elimination: Goal: Will not experience complications related to bowel motility Outcome: Progressing   Problem: Elimination: Goal: Will not experience complications related to urinary retention Outcome: Progressing   Problem: Nutrition: Goal: Adequate nutrition will be maintained Outcome: Progressing   Problem: Coping: Goal: Level of anxiety will decrease Outcome: Progressing   Problem: Clinical Measurements: Goal: Ability to maintain clinical measurements within normal limits will improve Outcome: Progressing   Problem: Clinical Measurements: Goal: Will remain free from infection Outcome: Progressing   Problem: Clinical Measurements: Goal: Diagnostic test results will improve Outcome: Progressing   Problem: Clinical Measurements: Goal: Respiratory complications will improve Outcome: Progressing   Problem: Clinical Measurements: Goal: Cardiovascular complication will be avoided Outcome: Progressing   Problem: Skin Integrity: Goal: Risk for impaired skin integrity will decrease Outcome: Progressing   Problem: Nutritional: Goal: Maintenance of adequate nutrition will improve Outcome: Progressing   Problem: Metabolic: Goal: Ability to maintain appropriate glucose levels will improve Outcome: Progressing

## 2024-03-08 NOTE — Progress Notes (Signed)
   03/08/24 2026  Vitals  Temp 98 F (36.7 C)  Temp Source Oral  BP (!) 156/74  MAP (mmHg) 96  BP Location Left Arm  BP Method Automatic  Patient Position (if appropriate) Lying  Pulse Rate 67  Pulse Rate Source Monitor  Resp 18  MEWS COLOR  MEWS Score Color Green  Oxygen Therapy  SpO2 96 %  O2 Device Nasal Cannula  O2 Flow Rate (L/min) 4 L/min  MEWS Score  MEWS Temp 0  MEWS Systolic 0  MEWS Pulse 0  MEWS RR 0  MEWS LOC 0  MEWS Score 0    Pt is A&O x 4.  VSS, continues on 4L /[place], denies sob. Neuro's intact, using bedpan and urinals. Safety maintained. Bed alarm on. Call bell in reach. Will continue to monitor.

## 2024-03-08 NOTE — Assessment & Plan Note (Addendum)
 Chronic right-sided weakness but questionable acute right facial droop. 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 Pt and wife are unaware of changes and thus no last known normal.  MRI negative.

## 2024-03-08 NOTE — Assessment & Plan Note (Addendum)
 Discussed CODE STATUS patient and he was agreeable to DNR but still had questions so continued full with plan to revisit

## 2024-03-08 NOTE — Care Management Obs Status (Cosign Needed)
 MEDICARE OBSERVATION STATUS NOTIFICATION   Patient Details  Name: Kyle [name] MRN: [id number] Date of Birth: 02/20/1951   Medicare Observation Status Notification Given:  Yes    Dane Dung, RN 03/08/2024, 12:30 PM

## 2024-03-08 NOTE — Care Management CC44 (Cosign Needed)
 Condition Code 44 Documentation Completed  Patient Details  Name: Kyle [name] MRN: [id number] Date of Birth: [birth year]   Condition Code 44 given:  Yes Patient signature on Condition Code 44 notice:  Yes Documentation of 2 MD's agreement:  Yes Code 44 added to claim:  Yes    Dane Dung, RN 03/08/2024, 12:30 PM

## 2024-03-09 ENCOUNTER — Emergency Department (HOSPITAL_COMMUNITY)

## 2024-03-09 ENCOUNTER — Other Ambulatory Visit (HOSPITAL_COMMUNITY): Payer: Self-pay

## 2024-03-09 ENCOUNTER — Ambulatory Visit: Admitting: Vascular Surgery

## 2024-03-09 DIAGNOSIS — I10 Essential (primary) hypertension: Secondary | ICD-10-CM | POA: Diagnosis not present

## 2024-03-09 DIAGNOSIS — J9612 Chronic respiratory failure with hypercapnia: Secondary | ICD-10-CM | POA: Diagnosis not present

## 2024-03-09 DIAGNOSIS — Z7401 Bed confinement status: Secondary | ICD-10-CM | POA: Diagnosis not present

## 2024-03-09 DIAGNOSIS — J9601 Acute respiratory failure with hypoxia: Secondary | ICD-10-CM | POA: Diagnosis not present

## 2024-03-09 LAB — GLUCOSE, CAPILLARY: Glucose-Capillary: 120 mg/dL — ABNORMAL HIGH (ref 70–99)

## 2024-03-09 MED ORDER — PREDNISONE 20 MG PO TABS
40.0000 mg | ORAL_TABLET | Freq: Every day | ORAL | 0 refills | Status: DC
  Filled 2024-03-09: qty 4, 2d supply, fill #0

## 2024-03-09 MED ORDER — AZITHROMYCIN 500 MG PO TABS
500.0000 mg | ORAL_TABLET | Freq: Every day | ORAL | 0 refills | Status: DC
  Filled 2024-03-09: qty 1, 1d supply, fill #0

## 2024-03-09 NOTE — Progress Notes (Signed)
 Ambulance here to pick up pt for transport home.  Wife in room.

## 2024-03-09 NOTE — Plan of Care (Signed)
  Problem: Fluid Volume: Goal: Ability to maintain a balanced intake and output will improve Outcome: Progressing   Problem: Metabolic: Goal: Ability to maintain appropriate glucose levels will improve Outcome: Progressing   Problem: Nutritional: Goal: Maintenance of adequate nutrition will improve Outcome: Progressing   Problem: Skin Integrity: Goal: Risk for impaired skin integrity will decrease Outcome: Progressing   Problem: Tissue Perfusion: Goal: Adequacy of tissue perfusion will improve Outcome: Progressing   Problem: Clinical Measurements: Goal: Will remain free from infection Outcome: Progressing   Problem: Clinical Measurements: Goal: Diagnostic test results will improve Outcome: Progressing   Problem: Clinical Measurements: Goal: Respiratory complications will improve Outcome: Progressing   Problem: Clinical Measurements: Goal: Cardiovascular complication will be avoided Outcome: Progressing   Problem: Nutrition: Goal: Adequate nutrition will be maintained Outcome: Progressing   Problem: Elimination: Goal: Will not experience complications related to bowel motility Outcome: Progressing   Problem: Elimination: Goal: Will not experience complications related to urinary retention Outcome: Progressing   Problem: Pain Managment: Goal: General experience of comfort will improve and/or be controlled Outcome: Progressing   Problem: Safety: Goal: Ability to remain free from injury will improve Outcome: Progressing   Problem: Skin Integrity: Goal: Risk for impaired skin integrity will decrease Outcome: Progressing

## 2024-03-09 NOTE — Progress Notes (Signed)
 DISCHARGE NOTE HOME AVALON ZUBIA to be discharged Home per MD order. Discussed prescriptions and follow up appointments with the patient. Prescriptions given to patient; medication list explained in detail. Patient verbalized understanding.  Skin clean, dry and intact without evidence of skin break down, no evidence of skin tears noted. IV catheter discontinued intact. Site without signs and symptoms of complications. Dressing and pressure applied. Pt denies pain at the site currently. No complaints noted.  Patient free of lines, drains, and wounds.   An After Visit Summary (AVS) was printed and given to the patient. PTAR to transport home   Tonda Francisco, RN

## 2024-03-09 NOTE — Discharge Summary (Addendum)
 Family Medicine Teaching [facility] Discharge Summary  Patient name: Kyle [name] Medical record number: [id number] Date of birth: 1951/01/06 Age: 73 y.o. Gender: male Date of Admission: 03/07/2024  Date of Discharge: 03/09/2024 Admitting Physician: Charmel Cooter, MD  Primary Care Provider: Genora Kidd, MD Consultants: none  Indication for Hospitalization: shortness of breath, increased oxygen demand  Discharge Diagnoses/Problem List:  Principal Problem for Admission: Acute hypoxic on chronic hypercapnic respiratory failure Other Problems addressed during stay:  Principal Problem:   Acute hypoxic on chronic hypercapnic respiratory failure (HCC) Active Problems:   Goals of care, counseling/discussion   (HFpEF) heart failure with preserved ejection fraction (HCC)   T2DM (type 2 diabetes mellitus) (HCC)   Right sided weakness   Chronic health problem   COPD exacerbation [facility])    Brief Hospital Course:  Kyle [name] is a 73 y.o.male with a history of COPD on up to 5 L at home, HFpEF, CKD 3A, insulin -dependent type 2 DM  who was admitted to the [facility] Medicine Teaching Service at [facility] for SOB and increased oxygen demand. Her hospital course is detailed below:  Acute hypoxemic on chronic hypercapnic respiratory failure Shortness of breath requiring more than regular 5 L at home.  Wheezing on exam but no cough, upper story symptoms, sick contacts, change in medications, volume overload.  BNP 227.  Chest x-ray negative for consolidation or pulm edema.  Treat as COPD exacerbation with azithromycin  (5/14-5/16), prednisone  (last dose 5/15), and DuoNebs.  Prior to discharge, saturating >90% on 2L and discussed with patient keeping between 88-92% given COPD and that he doesn't always need 5L if he follows his pulse ox.  R sided weakness Chronic right-sided weakness but questionable new right facial droop. 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 Pt and wife are  unaware of changes and thus no last known normal.  MRI is negative for acute stroke or other acute changes.  Goals of care Discussed code status patient and he was agreeable to concept of DNR but still had considerable questions so continued full code with plan to revisit conversation with wife present per patient request.   Other chronic conditions were medically managed with home medications and formulary alternatives as necessary (insulin -dependent type 2 DM, CKD 3A, HFpEF, hypertension, paroxysmal A-fib, CAD, pain, insomnia, GERD, constipation, hyperlipidemia, epilepsy)  PCP Follow-up Recommendations: Appointment to discuss/update patients advanced directives Continue to emphasis pt taking his oxygen saturation (goal 88-92%) to guide oxygen amount to avoid overoxygenating with COPD   Disposition: Home  Discharge Condition: Stable  Discharge Exam:  Vitals:   03/09/24 0452 03/09/24 0813  BP: (!) 164/76 (!) 166/60  Pulse: 62 (!) 55  Resp: 16   Temp: (!) 97.3 F (36.3 C)   SpO2: 98% 93%   General: Calm, cooperative, laying in bed, not in acute distress Pulmonary: Normal effort on 2 L oxygen and satting 92%.  No work of breathing.  Shallow breath sounds.  Clear to auscultation bilaterally listening anteriorly. Cardiovascular: Normal rate and rhythm.  Normal heart sounds. Neurologic: Alert and oriented person place and time.  Attention and memory grossly intact.  Speech without aphasia or dysarthria.  No focal deficits except for right upper and lower extremity 0/5, 1/5 strength respectively.   Significant Labs and Imaging:  Recent Labs  Lab 03/08/24 0621  WBC 5.5  HGB 12.3*  HCT 38.2*  PLT 166   Recent Labs  Lab 03/08/24 0621  NA 142  K 4.5  CL 107  CO2 26  GLUCOSE 207*  BUN 20  CREATININE 1.40*  CALCIUM  9.1   Chest x-ray 5/13  impression from Radiologist:  Mild cardiomegaly with findings suggestive of interstitial edema or atypical/viral infection.    My  Interpretation: Cardiomegaly, hyperinflated lungs, increased vascular markings, no evidence of obvious pulmonary edema or effusion.     CT head without contrast 5/13 No CT evidence of acute intracranial abnormality.   Encephalomalacia in the left corona radiata and basal ganglia likely reflecting sequelae of prior hemorrhage.   Mild chronic microvascular ischemic changes and parenchymal volume loss. 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   MRI brain without contrast 5/13 1.  No acute intracranial abnormality.   2. Chronic small vessel disease. 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   Results/Tests Pending at Time of Discharge: none  Discharge Medications:  Allergies as of 03/09/2024   No Known Allergies      Medication List     TAKE these medications    acetaminophen  325 MG tablet Commonly known as: TYLENOL  Take 2 tablets (650 mg total) by mouth every 4 (four) hours as needed for mild pain (pain score 1-3).   albuterol  (2.5 MG/3ML) 0.083% nebulizer solution Commonly known as: PROVENTIL  Take 3 mLs (2.5 mg total) by nebulization every 4 (four) hours as needed for wheezing or shortness of breath.   amLODipine  5 MG tablet Commonly known as: NORVASC  Take 5 mg by mouth at bedtime.   azithromycin  500 MG tablet Commonly known as: ZITHROMAX  Take 1 tablet (500 mg total) by mouth daily.   carvedilol  6.25 MG tablet Commonly known as: COREG  Take 1 tablet (6.25 mg total) by mouth 2 (two) times daily with a meal.   Centrum Men Tabs Take 1 tablet by mouth daily with breakfast.   Droplet Pen Needles 32G X 4 MM Misc Generic drug: Insulin  Pen Needle USE AS DIRECTED WITH INSULIN    Eliquis  5 MG Tabs tablet Generic drug: apixaban  TAKE 1 TABLET TWICE DAILY What changed: when to take this   gabapentin  600 MG tablet Commonly known as:  NEURONTIN  Take 1 tablet (600 mg total) by mouth at bedtime.   hydrALAZINE  25 MG tablet Commonly known as: APRESOLINE  Take 25 mg by mouth in the morning, at noon, and at bedtime.   isosorbide  mononitrate 30 MG 24 hr tablet Commonly known as: IMDUR  TAKE 1 TABLET EVERY DAY   levETIRAcetam  500 MG tablet Commonly known as: KEPPRA  Take 1 tablet (500 mg total) by mouth 2 (two) times daily.   mirtazapine  7.5 MG tablet Commonly known as: REMERON  Take 1 tablet (7.5 mg total) by mouth at bedtime.   Mucinex  600 MG 12 hr tablet Generic drug: guaiFENesin  Take 600 mg by mouth 2 (two) times daily.   NovoLOG  FlexPen 100 UNIT/ML FlexPen Generic drug: insulin  aspart Inject 0-10 Units into the skin See admin instructions. Inject 0-10 units into the skin three times a day before meals, per Sliding Scale: BGL 0-59 OR 400-1,000 = CALL MD; 60-150 = give nothing; 151-199 = 2 units; 200-249 = 4 units; 250-299 = 6 units; 300-349 = 8 units; 350-399 = 10 units   OXYGEN Inhale 4 L/min into the lungs continuous.   pantoprazole  40 MG tablet Commonly known as: PROTONIX  Take 1 tablet (40 mg total) by mouth daily.   polyethylene glycol 17 g packet Commonly known as: MIRALAX  / GLYCOLAX  Take 17 g by mouth 2 (two)  times daily as needed for mild constipation or moderate constipation.   predniSONE  20 MG tablet Commonly known as: DELTASONE  Take 2 tablets (40 mg total) by mouth daily with breakfast.   rosuvastatin  10 MG tablet Commonly known as: CRESTOR  TAKE 1 TABLET AT BEDTIME   Trelegy Ellipta  200-62.5-25 MCG/ACT Aepb Generic drug: Fluticasone -Umeclidin-Vilant INHALE 1 PUFF EVERY DAY What changed: See the new instructions.   Tresiba  FlexTouch 100 UNIT/ML FlexTouch Pen Generic drug: insulin  degludec Inject 5 Units into the skin in the morning.        Discharge Instructions: Please refer to Patient Instructions section of EMR for full details.  Patient was counseled important signs and symptoms that  should prompt return to medical care, changes in medications, dietary instructions, activity restrictions, and follow up appointments.   Follow-Up Appointments:  Follow-up Information     [facility] Follow up.   Specialty: Home Health Services Contact information: [address] [name] [address] [phone]                Future Appointments  Date Time Provider Department Center  03/13/2024  1:30 PM ACCESS TO CARE POOL [facility]  03/24/2024  9:00 AM [place], Peter M, MD [facility]  05/18/2024  3:30 PM [facility] ANNUAL Brodie Cannon VISIT [facility]  05/24/2024  1:45 PM Johny Nap, NP [facility] None      Albin Huh, MD 03/09/2024, 12:42 PM PGY-1, [facility] Family Medicine

## 2024-03-09 NOTE — TOC Transition Note (Signed)
 Transition of Care [facility]) - Discharge Note   Patient Details  Name: Kyle [name] MRN: [id number] Date of Birth: 1951-06-28  Transition of Care [facility]) CM/SW Contact:  Dane Dung, RN Phone Number: 03/09/2024, 11:21 AM   Clinical Narrative:    CM spoke with bedside nursing and patient will diischarge home today.  Patient is active with home health services through [facility] health and these services will continue.  PTAR was called for transport to home.  Bedside nursing to provide teaching to the patient's wife at the bedside prior to patient's discharge to home.         Patient Goals and CMS Choice            Discharge Placement                       Discharge Plan and Services Additional resources added to the After Visit Summary for                                       Social Drivers of Health (SDOH) Interventions SDOH Screenings   Food Insecurity: No Food Insecurity (03/07/2024)  Housing: Low Risk  (03/07/2024)  Transportation Needs: No Transportation Needs (03/07/2024)  Utilities: Not At Risk (03/07/2024)  Alcohol Screen: Low Risk  (03/05/2023)  Depression (PHQ2-9): Low Risk  (03/05/2023)  Financial Resource Strain: Low Risk  (03/05/2023)  Physical Activity: Sufficiently Active (03/05/2023)  Social Connections: Moderately Isolated (03/07/2024)  Stress: No Stress Concern Present (03/05/2023)  Tobacco Use: Medium Risk (03/07/2024)     Readmission Risk Interventions    03/08/2024    1:17 PM  Readmission Risk Prevention Plan  Transportation Screening Complete  Medication Review (RN Care Manager) Complete  PCP or Specialist appointment within 3-5 days of discharge Complete  HRI or Home Care Consult Complete  SW Recovery Care/Counseling Consult Complete  Palliative Care Screening Complete  Skilled Nursing Facility Not Applicable

## 2024-03-09 NOTE — Progress Notes (Signed)
 Physical Therapy Treatment Patient Details Name: Kyle [name] MRN: [id number] DOB: 1951-07-15 Today's Date: 03/09/2024   History of Present Illness 73 yo male presents to [facility] ED on 11/22/23 for hypoxia. PMH includes dilated cardiomyopathy, HF, COPD on 4.5L baseline, HTN, PAD, hx of fem pop bypass 2010, aortobifemoral bypass 2016, L thalamic ICH.    PT Comments  Pt received in supine, agreeable to therapy session with emphasis on bed mobility, techniques for pt to assist with his own mobility to reduce caregiver burden. Pt requesting assist to dress during session, and pt/spouse given gait belt with demo on how he can use it for AAROM/PROM of his RLE or as a strap at end of bed frame to assist with pulling his trunk upright to long sitting posture using his LUE. Pt with good participation and able to roll multiple times and good awareness of R hemibody during activity, pt c/o RLE knee pain with increased flexion. PTAR arrived prior to EOB or transfer trials, pt in care of discharge RN and PTAR at end of session.    If plan is discharge home, recommend the following: A lot of help with walking and/or transfers;A lot of help with bathing/dressing/bathroom;Assistance with cooking/housework;Assist for transportation;Help with stairs or ramp for entrance   Can travel by private vehicle        Equipment Recommendations  None recommended by PT (pt already has PWC ordered; spouse reports plan to obtain him a transport chair, PTA discusses she needs to find one with arm that is removable or drops down so he can scoot to chair)    Recommendations for Other Services       Precautions / Restrictions Precautions Precautions: Fall Recall of Precautions/Restrictions: Intact Precaution/Restrictions Comments: chronic R hemiplegia, monitor SpO2 Required Braces or Orthoses: Other Brace Other Brace: has RLE AFO in shoe in room Restrictions Weight Bearing Restrictions Per Provider Order: No     Mobility   Bed Mobility Overal bed mobility: Needs Assistance Bed Mobility: Supine to Sit, Sit to Supine, Rolling Rolling: Contact guard assist, Min assist, Used rails   Supine to sit: Mod assist, Min assist Sit to supine: Min assist   General bed mobility comments: used gait belt buckled to end of bed frame for pt to pull trunk to long sitting more easily on his own, also discussed using LLE to assist with RLE knee extension when he gets a cramp/discomfort; discussed technique for transfer to EOB/OOB but session time limited due to LUE bleeding through dressing after IV removed by RN and then by arrival of PTAR for DC after RN fixed his dressing    Transfers Overall transfer level: Needs assistance                 General transfer comment: defer OOB, PTAR arriving at end of session after bed mobility    Ambulation/Gait                   Stairs             Wheelchair Mobility     Tilt Bed    Modified Rankin (Stroke Patients Only)       Balance Overall balance assessment: Needs assistance Sitting-balance support: No upper extremity supported, Feet supported Sitting balance-Leahy Scale: Poor Sitting balance - Comments: pt needing LUE consistent support for long sitting balance, did not get to EOB before PTAR arrived. Postural control: Posterior lean     Standing balance comment: defer  Communication Communication Communication: No apparent difficulties  Cognition Arousal: Alert Behavior During Therapy: WFL for tasks assessed/performed, Anxious   PT - Cognitive impairments: No apparent impairments                       PT - Cognition Comments: slightly anxious regarding mobility/process of dressing to prepare for DC, pt spouse present and appears frustrated with his anxiety at times. Pt expressed appreciation for therapist assist. Following commands: Intact      Cueing Cueing Techniques: Verbal cues,  Gestural cues  Exercises Other Exercises Other Exercises: supine RLE AA/PROM: heel slides, hip ab/adduction and hip IR/ER x5-10 reps ea (pt shown how to use gait belt to assist with limb ROM/repositioning PRN). Other Exercises: bed "crunches" x5 reps pt using strap at foot of bed frame to assist with pulling trunk forward Other Exercises: supine LUE AROM: shoulder/elbow flex/ext x5 reps ea    General Comments General comments (skin integrity, edema, etc.): PTA and pt spouse assisted him to don depends type underwear and pants/shirt via pt rolling to L/R sides and long sitting in bed, pt needs totalA for shoe donning.      Pertinent Vitals/Pain Pain Assessment Pain Assessment: Faces Faces Pain Scale: Hurts little more Pain Location: RLE (knee) wtih increased flexion; improves wtih AAROM/repositioning Pain Descriptors / Indicators: Grimacing, Guarding, Moaning Pain Intervention(s): Limited activity within patient's tolerance, Monitored during session, Repositioned    Home Living                          Prior Function            PT Goals (current goals can now be found in the care plan section) Acute Rehab PT Goals Patient Stated Goal: to regain strength in R arm PT Goal Formulation: With patient Time For Goal Achievement: 03/21/24 Progress towards PT goals: Progressing toward goals    Frequency    Min 2X/week      PT Plan      Co-evaluation              AM-PAC PT "6 Clicks" Mobility   Outcome Measure  Help needed turning from your back to your side while in a flat bed without using bedrails?: A Lot Help needed moving from lying on your back to sitting on the side of a flat bed without using bedrails?: A Lot Help needed moving to and from a bed to a chair (including a wheelchair)?: A Lot Help needed standing up from a chair using your arms (e.g., wheelchair or bedside chair)?: A Lot Help needed to walk in hospital room?: Total Help needed climbing 3-5  steps with a railing? : Total 6 Click Score: 10    End of Session Equipment Utilized During Treatment: Gait belt;Oxygen (3L/min [name]) Activity Tolerance: Patient tolerated treatment well;Other (comment) (PTAR arriving limited activity OOB) Patient left: in bed;with call bell/phone within reach;with bed alarm set;Other (comment);with family/visitor present;with nursing/sitter in room (RN arriving to review DC paperwork, pt bed in chair posture, LUE elevated on 2 pillows to help reduce risk of bleeding) Nurse Communication: Mobility status;Other (comment) (LUE bleeding after IV removed (RN arrived to re-dress arm)) PT Visit Diagnosis: Other abnormalities of gait and mobility (R26.89);Muscle weakness (generalized) (M62.81);Other symptoms and signs involving the nervous system (R29.898)     Time: 1119-1150 PT Time Calculation (min) (ACUTE ONLY): 31 min  Charges:    $Therapeutic Exercise: 8-22 mins $Therapeutic Activity: 8-22  mins PT General Charges $$ ACUTE PT VISIT: 1 Visit                     Angell Pincock P., PTA Acute Rehabilitation Services Secure Chat Preferred 9a-5:30pm Office: [phone]    Mariel Shope [facility] 03/09/2024, 12:20 PM

## 2024-03-13 ENCOUNTER — Inpatient Hospital Stay: Payer: Self-pay

## 2024-03-13 NOTE — Progress Notes (Deleted)
    SUBJECTIVE:   CHIEF COMPLAINT / HPI: HFU  PCP Follow-up Recommendations: Appointment to discuss/update patients advanced directives Continue to emphasis pt taking his oxygen saturation (goal 88-92%) to guide oxygen amount to avoid overoxygenating with COPD  PERTINENT  PMH / PSH: COPD, PAF, CKD3, T2DM, HTN, HLD  OBJECTIVE:   There were no vitals taken for this visit.  ***  ASSESSMENT/PLAN:   Assessment & Plan  No follow-ups on file.  Ivin Marrow, MD [facility]

## 2024-03-15 DIAGNOSIS — E1151 Type 2 diabetes mellitus with diabetic peripheral angiopathy without gangrene: Secondary | ICD-10-CM | POA: Diagnosis not present

## 2024-03-15 DIAGNOSIS — I5022 Chronic systolic (congestive) heart failure: Secondary | ICD-10-CM | POA: Diagnosis not present

## 2024-03-15 DIAGNOSIS — E1122 Type 2 diabetes mellitus with diabetic chronic kidney disease: Secondary | ICD-10-CM | POA: Diagnosis not present

## 2024-03-15 DIAGNOSIS — I69392 Facial weakness following cerebral infarction: Secondary | ICD-10-CM | POA: Diagnosis not present

## 2024-03-15 DIAGNOSIS — L89312 Pressure ulcer of right buttock, stage 2: Secondary | ICD-10-CM | POA: Diagnosis not present

## 2024-03-15 DIAGNOSIS — I69322 Dysarthria following cerebral infarction: Secondary | ICD-10-CM | POA: Diagnosis not present

## 2024-03-15 DIAGNOSIS — I69351 Hemiplegia and hemiparesis following cerebral infarction affecting right dominant side: Secondary | ICD-10-CM | POA: Diagnosis not present

## 2024-03-15 DIAGNOSIS — J449 Chronic obstructive pulmonary disease, unspecified: Secondary | ICD-10-CM | POA: Diagnosis not present

## 2024-03-15 DIAGNOSIS — I130 Hypertensive heart and chronic kidney disease with heart failure and stage 1 through stage 4 chronic kidney disease, or unspecified chronic kidney disease: Secondary | ICD-10-CM | POA: Diagnosis not present

## 2024-03-17 ENCOUNTER — Emergency Department (HOSPITAL_COMMUNITY)
Admission: EM | Admit: 2024-03-17 | Discharge: 2024-03-17 | Disposition: A | Discharge status: Home / Self Care | Attending: Emergency Medicine | Admitting: Emergency Medicine

## 2024-03-17 ENCOUNTER — Emergency Department (HOSPITAL_COMMUNITY)

## 2024-03-17 ENCOUNTER — Other Ambulatory Visit: Payer: Self-pay

## 2024-03-17 ENCOUNTER — Encounter (HOSPITAL_COMMUNITY): Payer: Self-pay

## 2024-03-17 DIAGNOSIS — Z7952 Long term (current) use of systemic steroids: Secondary | ICD-10-CM | POA: Insufficient documentation

## 2024-03-17 DIAGNOSIS — I10 Essential (primary) hypertension: Secondary | ICD-10-CM | POA: Diagnosis not present

## 2024-03-17 DIAGNOSIS — I130 Hypertensive heart and chronic kidney disease with heart failure and stage 1 through stage 4 chronic kidney disease, or unspecified chronic kidney disease: Secondary | ICD-10-CM | POA: Diagnosis not present

## 2024-03-17 DIAGNOSIS — R531 Weakness: Secondary | ICD-10-CM | POA: Diagnosis not present

## 2024-03-17 DIAGNOSIS — R918 Other nonspecific abnormal finding of lung field: Secondary | ICD-10-CM | POA: Diagnosis not present

## 2024-03-17 DIAGNOSIS — I509 Heart failure, unspecified: Secondary | ICD-10-CM | POA: Diagnosis not present

## 2024-03-17 DIAGNOSIS — Z79899 Other long term (current) drug therapy: Secondary | ICD-10-CM | POA: Diagnosis not present

## 2024-03-17 DIAGNOSIS — J189 Pneumonia, unspecified organism: Secondary | ICD-10-CM | POA: Diagnosis not present

## 2024-03-17 DIAGNOSIS — J439 Emphysema, unspecified: Secondary | ICD-10-CM | POA: Diagnosis not present

## 2024-03-17 DIAGNOSIS — Z7951 Long term (current) use of inhaled steroids: Secondary | ICD-10-CM | POA: Insufficient documentation

## 2024-03-17 DIAGNOSIS — R000 Tachycardia, unspecified: Secondary | ICD-10-CM | POA: Diagnosis not present

## 2024-03-17 DIAGNOSIS — Z8673 Personal history of transient ischemic attack (TIA), and cerebral infarction without residual deficits: Secondary | ICD-10-CM | POA: Diagnosis not present

## 2024-03-17 DIAGNOSIS — Z7401 Bed confinement status: Secondary | ICD-10-CM | POA: Diagnosis not present

## 2024-03-17 DIAGNOSIS — R509 Fever, unspecified: Secondary | ICD-10-CM | POA: Diagnosis not present

## 2024-03-17 DIAGNOSIS — I517 Cardiomegaly: Secondary | ICD-10-CM | POA: Diagnosis not present

## 2024-03-17 DIAGNOSIS — I700 Atherosclerosis of aorta: Secondary | ICD-10-CM | POA: Diagnosis not present

## 2024-03-17 DIAGNOSIS — R0902 Hypoxemia: Secondary | ICD-10-CM | POA: Diagnosis not present

## 2024-03-17 DIAGNOSIS — Z7901 Long term (current) use of anticoagulants: Secondary | ICD-10-CM | POA: Diagnosis not present

## 2024-03-17 DIAGNOSIS — N189 Chronic kidney disease, unspecified: Secondary | ICD-10-CM | POA: Diagnosis not present

## 2024-03-17 DIAGNOSIS — J449 Chronic obstructive pulmonary disease, unspecified: Secondary | ICD-10-CM | POA: Diagnosis not present

## 2024-03-17 LAB — CBC WITH DIFFERENTIAL/PLATELET
Abs Immature Granulocytes: 0.08 10*3/uL — ABNORMAL HIGH (ref 0.00–0.07)
Basophils Absolute: 0 10*3/uL (ref 0.0–0.1)
Basophils Relative: 0 %
Eosinophils Absolute: 0.1 10*3/uL (ref 0.0–0.5)
Eosinophils Relative: 1 %
HCT: 34.2 % — ABNORMAL LOW (ref 39.0–52.0)
Hemoglobin: 10.8 g/dL — ABNORMAL LOW (ref 13.0–17.0)
Immature Granulocytes: 1 %
Lymphocytes Relative: 14 %
Lymphs Abs: 1.2 10*3/uL (ref 0.7–4.0)
MCH: 29.8 pg (ref 26.0–34.0)
MCHC: 31.6 g/dL (ref 30.0–36.0)
MCV: 94.5 fL (ref 80.0–100.0)
Monocytes Absolute: 0.9 10*3/uL (ref 0.1–1.0)
Monocytes Relative: 11 %
Neutro Abs: 6.2 10*3/uL (ref 1.7–7.7)
Neutrophils Relative %: 73 %
Platelets: 216 10*3/uL (ref 150–400)
RBC: 3.62 MIL/uL — ABNORMAL LOW (ref 4.22–5.81)
RDW: 15.9 % — ABNORMAL HIGH (ref 11.5–15.5)
WBC: 8.6 10*3/uL (ref 4.0–10.5)
nRBC: 0 % (ref 0.0–0.2)

## 2024-03-17 LAB — URINALYSIS, W/ REFLEX TO CULTURE (INFECTION SUSPECTED)
Bacteria, UA: NONE SEEN
Bilirubin Urine: NEGATIVE
Glucose, UA: NEGATIVE mg/dL
Ketones, ur: NEGATIVE mg/dL
Leukocytes,Ua: NEGATIVE
Nitrite: NEGATIVE
Protein, ur: NEGATIVE mg/dL
Specific Gravity, Urine: 1.005 (ref 1.005–1.030)
pH: 8 (ref 5.0–8.0)

## 2024-03-17 LAB — RESP PANEL BY RT-PCR (RSV, FLU A&B, COVID)  RVPGX2
Influenza A by PCR: NEGATIVE
Influenza B by PCR: NEGATIVE
Resp Syncytial Virus by PCR: NEGATIVE
SARS Coronavirus 2 by RT PCR: NEGATIVE

## 2024-03-17 LAB — COMPREHENSIVE METABOLIC PANEL WITH GFR
ALT: 11 U/L (ref 0–44)
AST: 17 U/L (ref 15–41)
Albumin: 3 g/dL — ABNORMAL LOW (ref 3.5–5.0)
Alkaline Phosphatase: 46 U/L (ref 38–126)
Anion gap: 9 (ref 5–15)
BUN: 21 mg/dL (ref 8–23)
CO2: 24 mmol/L (ref 22–32)
Calcium: 8.7 mg/dL — ABNORMAL LOW (ref 8.9–10.3)
Chloride: 105 mmol/L (ref 98–111)
Creatinine, Ser: 1.41 mg/dL — ABNORMAL HIGH (ref 0.61–1.24)
GFR, Estimated: 53 mL/min — ABNORMAL LOW (ref >60)
Glucose, Bld: 91 mg/dL (ref 70–99)
Potassium: 4 mmol/L (ref 3.5–5.1)
Sodium: 138 mmol/L (ref 135–145)
Total Bilirubin: 0.7 mg/dL (ref 0.0–1.2)
Total Protein: 6.5 g/dL (ref 6.5–8.1)

## 2024-03-17 LAB — I-STAT CG4 LACTIC ACID, ED: Lactic Acid, Venous: 1.5 mmol/L (ref 0.5–1.9)

## 2024-03-17 LAB — PROTIME-INR
INR: 1.6 — ABNORMAL HIGH (ref 0.8–1.2)
Prothrombin Time: 19 s — ABNORMAL HIGH (ref 11.4–15.2)

## 2024-03-17 MED ORDER — METRONIDAZOLE 500 MG/100ML IV SOLN
500.0000 mg | Freq: Once | INTRAVENOUS | Status: AC
  Administered 2024-03-17: 500 mg via INTRAVENOUS
  Filled 2024-03-17: qty 100

## 2024-03-17 MED ORDER — VANCOMYCIN HCL IN DEXTROSE 1-5 GM/200ML-% IV SOLN
1000.0000 mg | Freq: Once | INTRAVENOUS | Status: AC
  Administered 2024-03-17: 1000 mg via INTRAVENOUS
  Filled 2024-03-17: qty 200

## 2024-03-17 MED ORDER — IPRATROPIUM-ALBUTEROL 0.5-2.5 (3) MG/3ML IN SOLN: 3.0000 mL | Freq: Once | RESPIRATORY_TRACT | Status: DC

## 2024-03-17 MED ORDER — LACTATED RINGERS IV SOLN: INTRAVENOUS | Status: DC

## 2024-03-17 MED ORDER — LACTATED RINGERS IV BOLUS (SEPSIS)
1000.0000 mL | Freq: Once | INTRAVENOUS | Status: AC
  Administered 2024-03-17: 1000 mL via INTRAVENOUS

## 2024-03-17 MED ORDER — SODIUM CHLORIDE 0.9 % IV SOLN
2.0000 g | Freq: Once | INTRAVENOUS | Status: AC
  Administered 2024-03-17: 2 g via INTRAVENOUS
  Filled 2024-03-17: qty 12.5

## 2024-03-17 MED ORDER — LACTATED RINGERS IV BOLUS (SEPSIS): 250.0000 mL | Freq: Once | INTRAVENOUS | Status: DC

## 2024-03-17 MED ORDER — DOXYCYCLINE HYCLATE 100 MG PO CAPS: 100.0000 mg | ORAL_CAPSULE | Freq: Two times a day (BID) | ORAL | 0 refills | Status: DC

## 2024-03-17 NOTE — Progress Notes (Deleted)
 Kyle [name] Date of Birth: April 02, 1951 Medical Record #[id number]  History of Present Illness: Mr. Tones is seen for follow up CHF.  Last seen in February 2022. He has a history of systolic CHF with EF of 20-25% in May 2012. Coronary anatomy was normal by cath. He has a bicuspid AV with moderate AS/AI. He had moderate pulmonary HTN. He was treated medically and one of my old notes stated that his LV function showed marked improvement with  Echo in 2011 showing improvement in EF to 55-60%.   Unfortunately , he was lost to follow up and was not taking medication for over 2 years. He also has a history of HTN, hyperlipidemia, and PAD s/p right fem-pop BPG.   When seen in October 2015 he had evidence of recurrent CHF with severe HTN. Echo showed an EF of 20-25% with moderate AS/AI. He was started back on medication with lasix , carvedilol , and quinapril . He did not come back for follow up as instructed. He was seen in Jan. 2016 for surgical clearance for vascular surgery.  He was seen by Dr. Shaunna Delaware for claudication and was found to have severe PAD. Revascularization recommended. He underwent aortobifemoral BPG in February 2016. Was seen by me in April 2019- had missed multiple appointments. He did have an Echo in July 2018 showing normalization of LV function with EF 55%.  He is  diabetic and on insulin .  He was admitted in January 2020 with hypotension and shock. Had malaise, N/V and sweats. Treated with antibiotics. Had respiratory failure with hypoxia. Sent home with oxygen. He had Afib initially then converted to NSR. Started on Eliquis . Continued on Coreg . Had AKI with creatinine up to 2.7 that improved to 1.03. LFTs were elevated. Echo showed EF 40-45%. There was RV dysfunction with moderate pulmonary HTN. Moderate AI with mild AS. CT negative for PE but showed advanced emphysema.   He presented in June 2020 with progressive right leg pain and nonhealing wound on his foot. Angiography showed  Occluded graft to the right leg and only peroneal run off. He underwent the following surgery on 04/24/19 by Dr Shirley Douglas. Right femoral and repair with interposition 8 mm Hemashield graft from prior right limb of aortofemoral bypass into into deep femoral artery, #2 right femoral to peroneal bypass with translocated non-reversed great saphenous vein harvested from the left leg. He did not follow up with vascular surgery or cardiology post surgery. Has only followed up with pulmonary for COPD with hypoxemia and pulmonary HTN.   He was admitted 2/28-3/18/21 with abdominal pain, N/V, and diarrhea. He had acute renal failure and lactic acidosis of unclear etiology. He required mechanical ventilation for one day and intermittent dialysis but was not on dialysis at discharge. His acidosis cleared. He had encephalopathy. Cranial CT and MRI were negative. CT abd/pelvis was negative. He was switched from Eliquis  to Coumadin  at DC due to AKI. Lasix  was held.  He was readmitted 3/27-3/31/21 with acute CHF. He responded to IV diuresis. He was DC on lasix  20 mg bid. DC creatinine was 2.52.   He was admitted again 4/26-4/30/21 with acute hyperglycemic hyperosmolar state.  Last creatinine 1.74. lasix  was discontinued at discharge. He was seen by me in September 2021 and was doing well at that time.   Since his last visit he had repeat Echo in June 2022 showing EF 45-50%. Moderate AI.  He was seen by Dr Shaunna Delaware and had repeat angiogram in August 2022 showing patent bypass grafts.  I last saw him in April 2024. Since then he has had multiple hospitalizations. In November 2024 he had intracranial hemorrhage while on Eliquis  and with uncontrolled HTN. Eliquis  was reversed with Kcentra . Head and neck CTA was ordered for further stroke workup. Demonstrated severe aortic atherosclerosis with multiple plaque ulcerations and suspicion of intramural hematoma and impending aortic dissection. CTA dissection ordered for further  evaluation which showed additional large iliac groin pseudoaneurysm. CT and vascular surgery were consulted who recommended no further surgical intervention. Echo showed normal EF with moderate LVH and normal valves. BP was controlled and he was DC to inpatient Rehab for 4 weeks. He was then DC to SNF.  In Jan 2025 he was admitted with acute hypoxic respiratory failure in setting of Covid. Readmitted in Feb with again respiratory failure and emphysema. Treated with steroids and antibx.    Allergies as of 03/24/2024   No Known Allergies      Medication List        Accurate as of Mar 17, 2024  3:22 PM. If you have any questions, ask your nurse or doctor.          acetaminophen  325 MG tablet Commonly known as: TYLENOL  Take 2 tablets (650 mg total) by mouth every 4 (four) hours as needed for mild pain (pain score 1-3).   albuterol  (2.5 MG/3ML) 0.083% nebulizer solution Commonly known as: PROVENTIL  Take 3 mLs (2.5 mg total) by nebulization every 4 (four) hours as needed for wheezing or shortness of breath.   amLODipine  5 MG tablet Commonly known as: NORVASC  Take 5 mg by mouth at bedtime.   azithromycin  500 MG tablet Commonly known as: ZITHROMAX  Take 1 tablet (500 mg total) by mouth daily.   carvedilol  6.25 MG tablet Commonly known as: COREG  Take 1 tablet (6.25 mg total) by mouth 2 (two) times daily with a meal.   Centrum Men Tabs Take 1 tablet by mouth daily with breakfast.   Droplet Pen Needles 32G X 4 MM Misc Generic drug: Insulin  Pen Needle USE AS DIRECTED WITH INSULIN    Eliquis  5 MG Tabs tablet Generic drug: apixaban  TAKE 1 TABLET TWICE DAILY What changed: when to take this   gabapentin  600 MG tablet Commonly known as: NEURONTIN  Take 1 tablet (600 mg total) by mouth at bedtime.   hydrALAZINE  25 MG tablet Commonly known as: APRESOLINE  Take 25 mg by mouth in the morning, at noon, and at bedtime.   isosorbide  mononitrate 30 MG 24 hr tablet Commonly known as:  IMDUR  TAKE 1 TABLET EVERY DAY   levETIRAcetam  500 MG tablet Commonly known as: KEPPRA  Take 1 tablet (500 mg total) by mouth 2 (two) times daily.   mirtazapine  7.5 MG tablet Commonly known as: REMERON  Take 1 tablet (7.5 mg total) by mouth at bedtime.   Mucinex  600 MG 12 hr tablet Generic drug: guaiFENesin  Take 600 mg by mouth 2 (two) times daily.   NovoLOG  FlexPen 100 UNIT/ML FlexPen Generic drug: insulin  aspart Inject 0-10 Units into the skin See admin instructions. Inject 0-10 units into the skin three times a day before meals, per Sliding Scale: BGL 0-59 OR 400-1,000 = CALL MD; 60-150 = give nothing; 151-199 = 2 units; 200-249 = 4 units; 250-299 = 6 units; 300-349 = 8 units; 350-399 = 10 units   OXYGEN Inhale 4 L/min into the lungs continuous.   pantoprazole  40 MG tablet Commonly known as: PROTONIX  Take 1 tablet (40 mg total) by mouth daily.   polyethylene glycol 17 g packet  Commonly known as: MIRALAX  / GLYCOLAX  Take 17 g by mouth 2 (two) times daily as needed for mild constipation or moderate constipation.   predniSONE  20 MG tablet Commonly known as: DELTASONE  Take 2 tablets (40 mg total) by mouth daily with breakfast.   rosuvastatin  10 MG tablet Commonly known as: CRESTOR  TAKE 1 TABLET AT BEDTIME   Trelegy Ellipta  200-62.5-25 MCG/ACT Aepb Generic drug: Fluticasone -Umeclidin-Vilant INHALE 1 PUFF EVERY DAY What changed: See the new instructions.   Tresiba  FlexTouch 100 UNIT/ML FlexTouch Pen Generic drug: insulin  degludec Inject 5 Units into the skin in the morning.         No Known Allergies  Past Medical History:  Diagnosis Date   Aortic insufficiency    MODERATE WITH A BICUSPID AORTIC VAVLE   Arterial occlusive disease    MULTILEVEL   Atrial fibrillation (HCC)    CHF (congestive heart failure) (HCC)    Chronic bronchitis (HCC)    CKD (chronic kidney disease) stage 3, GFR 30-59 ml/min (HCC)    COPD (chronic obstructive pulmonary disease) (HCC)     Dilated cardiomyopathy (HCC)    WITH EJECTION FRACTION DOWN TO 20-25%--WITH CONGESTIVE HEART FAILURE   Edema    LOWER EXTREMETIES   Emphysema of lung (HCC)    Hemorrhagic stroke (HCC) 09/06/2023   right side defecit   Hypertension    ICH (intracerebral hemorrhage) (HCC) 09/10/2023   Insulin  dependent type 2 diabetes mellitus (HCC)    Normal coronary arteries 2009   Orthopnea    Peripheral arterial disease (HCC)    Pulmonary hypertension (HCC)     Past Surgical History:  Procedure Laterality Date   ABDOMINAL AORTAGRAM N/A 11/05/2014   Procedure: ABDOMINAL Tommi Fraise;  Surgeon: Dannis Dy, MD;  Location: [facility] CATH LAB;  Service: Cardiovascular;  Laterality: N/A;   ABDOMINAL AORTOGRAM W/LOWER EXTREMITY Bilateral 04/21/2019   Procedure: ABDOMINAL AORTOGRAM W/LOWER EXTREMITY;  Surgeon: Dannis Dy, MD;  Location: [facility] INVASIVE CV LAB;  Service: Cardiovascular;  Laterality: Bilateral;   ABDOMINAL AORTOGRAM W/LOWER EXTREMITY Right 05/30/2021   Procedure: ABDOMINAL AORTOGRAM W/LOWER EXTREMITY;  Surgeon: Dannis Dy, MD;  Location: [facility] INVASIVE CV LAB;  Service: Cardiovascular;  Laterality: Right;   AORTA - BILATERAL FEMORAL ARTERY BYPASS GRAFT N/A 12/18/2014   Procedure: AORTOBIFEMORAL BYPASS GRAFT;  Surgeon: Dannis Dy, MD;  Location: [facility] OR;  Service: Vascular;  Laterality: N/A;   CARDIAC CATHETERIZATION  2009   Nl Cors, EF 20%   COLONOSCOPY     ENDARTERECTOMY FEMORAL Left 12/18/2014   Procedure: ENDARTERECTOMY FEMORAL;  Surgeon: Dannis Dy, MD;  Location: [facility] OR;  Service: Vascular;  Laterality: Left;   FALSE ANEURYSM REPAIR Right 04/24/2019   Procedure: REPAIR OF RIGHT FEMORAL ARTERY PSEUDOANEURYSM;  Surgeon: Mayo Speck, MD;  Location: [facility] OR;  Service: Vascular;  Laterality: Right;   FEMORAL-POPLITEAL BYPASS GRAFT  02/21/2011   right   FEMORAL-TIBIAL BYPASS GRAFT Right 04/24/2019   Procedure: REDO BYPASS GRAFT RIGHT FEMORAL-TIBIAL ARTERY USING  LEFT LEG VEIN & HEMASHIELD GOLD 8mm GRAFT;  Surgeon: Mayo Speck, MD;  Location: [facility] OR;  Service: Vascular;  Laterality: Right;   IR FLUORO GUIDE CV LINE RIGHT  12/28/2019   IR REMOVAL TUN CV CATH W/O FL  01/11/2020   IR US  GUIDE VASC ACCESS RIGHT  12/28/2019   OTHER SURGICAL HISTORY     POST RIGHT FEMOROPOPLITEAL BYPASS GRAFT   PERIPHERAL VASCULAR CATHETERIZATION  11/05/2014   Procedure: LOWER EXTREMITY ANGIOGRAPHY;  Surgeon: Dannis Dy, MD;  Location: [facility] CATH LAB;  Service: Cardiovascular;;   VEIN HARVEST Left 04/24/2019   Procedure: LEFT LEG SAPHENOUS VEIN HARVEST;  Surgeon: Mayo Speck, MD;  Location: [facility] OR;  Service: Vascular;  Laterality: Left;    Social History   Socioeconomic History   Marital status: Married    Spouse name: Not on file   Number of children: 1   Years of education: Not on file   Highest education level: Not on file  Occupational History   Occupation: retired    Associate Professor: [employer]  Tobacco Use   Smoking status: Former    Current packs/day: 0.00    Average packs/day: 1 pack/day for 40.0 years (40.0 ttl pk-yrs)    Types: Cigarettes    Start date: 10/15/1978    Quit date: 10/15/2018    Years since quitting: 5.4    Passive exposure: Never   Smokeless tobacco: Never   Tobacco comments:    quit 2010 and quit again 2019  Vaping Use   Vaping status: Never Used  Substance and Sexual Activity   Alcohol use: No    Alcohol/week: 0.0 standard drinks of alcohol   Drug use: No   Sexual activity: Not on file  Other Topics Concern   Not on file  Social History Narrative   Not on file   Social Drivers of Health   Financial Resource Strain: Low Risk  (03/05/2023)   Overall Financial Resource Strain (CARDIA)    Difficulty of Paying Living Expenses: Not hard at all  Food Insecurity: No Food Insecurity (03/07/2024)   Hunger Vital Sign    Worried About Running Out of Food in the Last Year: Never true    Ran Out of Food in the Last Year: Never true   Transportation Needs: No Transportation Needs (03/07/2024)   PRAPARE - Administrator, Civil Service (Medical): No    Lack of Transportation (Non-Medical): No  Physical Activity: Sufficiently Active (03/05/2023)   Exercise Vital Sign    Days of Exercise per Week: 5 days    Minutes of Exercise per Session: 30 min  Stress: No Stress Concern Present (03/05/2023)   [employer] of Occupational Health - Occupational Stress Questionnaire    Feeling of Stress : Not at all  Social Connections: Moderately Isolated (03/07/2024)   Social Connection and Isolation Panel [NHANES]    Frequency of Communication with Friends and Family: Once a week    Frequency of Social Gatherings with Friends and Family: Once a week    Attends Religious Services: More than 4 times per year    Active Member of [employer] or Organizations: No    Attends Banker Meetings: Never    Marital Status: Married    Family History  Problem Relation Age of Onset   Diabetes Mother    Hyperlipidemia Sister    Hypertension Sister     Review of Systems: As noted in HPI.   All other systems were reviewed and are negative.  Physical Exam: There were no vitals taken for this visit. There were no vitals filed for this visit.     GENERAL:  Well appearing, thin BM in NAD HEENT:  PERRL, EOMI, sclera are clear. Oropharynx is clear. NECK:  No jugular venous distention, carotid upstroke brisk and symmetric, no bruits, no thyromegaly or adenopathy LUNGS:  Clear to auscultation bilaterally CHEST:  Unremarkable HEART:  RRR,  PMI not displaced or sustained,S1 and S2 within normal limits, no S3, no S4: no clicks,  no rubs, gr 1/6 systolic and diastolic murmur RUSB.  ABD:  Soft, nontender. BS +, no masses or bruits. No hepatomegaly, no splenomegaly EXT:  Pedal pulses are weak.   no edema, no cyanosis no clubbing SKIN:  Warm and dry.  No rashes NEURO:  Alert and oriented x 3. Cranial nerves II through XII  intact. PSYCH:  Cognitively intact   LABORATORY DATA: . Lab Results  Component Value Date   WBC 8.6 03/17/2024   HGB 10.8 (L) 03/17/2024   HCT 34.2 (L) 03/17/2024   PLT 216 03/17/2024   GLUCOSE 91 03/17/2024   CHOL 123 09/06/2023   TRIG 62 09/08/2023   HDL 40 (L) 09/06/2023   LDLCALC 61 09/06/2023   ALT 11 03/17/2024   AST 17 03/17/2024   NA 138 03/17/2024   K 4.0 03/17/2024   CL 105 03/17/2024   CREATININE 1.41 (H) 03/17/2024   BUN 21 03/17/2024   CO2 24 03/17/2024   TSH 0.382 04/08/2021   INR 1.6 (H) 03/17/2024   HGBA1C 5.8 (H) 03/07/2024    Labs dated 07/28/17: BUN 29, creatinine 1.03. Other chemistries normal. A1c 6.2%. Cholesterol 137, Triglycerides 63, HDL 43, LDL 81, TSH normal. Labs dated 12/21/17: cholesterol 123, triglycerides 64, HDL 41, LDL 69. A1c 6.3%. CMET and CBC normal. TSH normal. Dated 10/22/20: A1c 6.7%. creatinine 1.41, cholesterol 149, triglycerides 170, HDL 39, LDL 81. TSH, CBC, other chemistries normal.   Ecg today shows NSR with rate 71 with occ PVC, LVH with repolarization abnormality. I have personally reviewed and interpreted this study.   Echo: 05/06/17:  Study Conclusions   - Left ventricle: The cavity size was normal. Wall thickness was   increased in a pattern of mild LVH. Systolic function was normal.   The estimated ejection fraction was in the range of 50% to 55%.   Wall motion was normal; there were no regional wall motion   abnormalities. Doppler parameters are consistent with abnormal   left ventricular relaxation (grade 1 diastolic dysfunction). - Aortic valve: There was mild stenosis. There was mild to moderate   regurgitation. - Mitral valve: Calcified annulus. - Left atrium: The atrium was mildly dilated. - Right ventricle: The cavity size was mildly dilated. - Right atrium: The atrium was mildly dilated. - Pulmonary arteries: Systolic pressure was moderately increased.   Impressions:   - Normal LV systolic function; mild  LVH; mild diastolic   dysfunction; calcified aortic valve with mild AS (mean gradient   18 mmHg) and mild to moderate AI; mild LAE; mild RAE and RVE;   trace TR with moderately elevated pulmonary pressure.  Echo 11/21/18: Study Conclusions   - Left ventricle: The cavity size was normal. Systolic function was   mildly to moderately reduced. The estimated ejection fraction was   in the range of 40% to 45%. Diffuse hypokinesis. The study is not   technically sufficient to allow evaluation of LV diastolic   function. - Ventricular septum: The contour showed diastolic flattening. - Aortic valve: Valve mobility was restricted. There was mild   stenosis. There was moderate regurgitation. - Right ventricle: The cavity size was moderately dilated. Wall   thickness was normal. Systolic function was moderately reduced. - Right atrium: The atrium was severely dilated. - Tricuspid valve: There was moderate regurgitation. - Pulmonary arteries: Systolic pressure was moderately increased.   PA peak pressure: 53 mm Hg (S).   Impressions:   - Compared to the prior study, there has been no significant  interval change.   Echo 12/13/19: IMPRESSIONS     1. Severe concentric LVH, consider evaluation for infiltrative  cardiomyopathies such as cardiac amyloidosis.   2. Impaired GLS: -14.6%. Left ventricular ejection fraction, by  estimation, is 65 to 70%. The left ventricle has normal function. The left  ventricle has no regional wall motion abnormalities. There is severe  concentric left ventricular hypertrophy. Left   ventricular diastolic parameters are consistent with Grade I diastolic  dysfunction (impaired relaxation).   3. Right ventricular systolic function is normal. The right ventricular  size is normal. There is moderately elevated pulmonary artery systolic  pressure. The estimated right ventricular systolic pressure is 63.2 mmHg.   4. The mitral valve is normal in structure and function.  No evidence of  mitral valve regurgitation. No evidence of mitral stenosis.   5. The aortic valve is normal in structure and function. Aortic valve  regurgitation is moderate. Mild to moderate aortic valve stenosis.   6. The inferior vena cava is normal in size with greater than 50%  respiratory variability, suggesting right atrial pressure of 3 mmHg.   Echo 04/08/21: IMPRESSIONS     1. Hypokinesis of the inferolateral wall with overall mild LV  dysfunction.   2. Left ventricular ejection fraction, by estimation, is 45 to 50%. The  left ventricle has mildly decreased function. The left ventricle  demonstrates regional wall motion abnormalities (see scoring  diagram/findings for description). There is moderate  left ventricular hypertrophy. Left ventricular diastolic parameters are  consistent with Grade I diastolic dysfunction (impaired relaxation).   3. Right ventricular systolic function is normal. The right ventricular  size is normal. There is moderately elevated pulmonary artery systolic  pressure.   4. Left atrial size was mildly dilated.   5. Right atrial size was mildly dilated.   6. The mitral valve is normal in structure. Mild mitral valve  regurgitation. No evidence of mitral stenosis.   7. The aortic valve is normal in structure. Aortic valve regurgitation is  moderate. Mild to moderate aortic valve stenosis.   8. The inferior vena cava is dilated in size with >50% respiratory  variability, suggesting right atrial pressure of 8 mmHg.   Echo 09/06/23: IMPRESSIONS     1. Left ventricular ejection fraction, by estimation, is 55 to 60%. The  left ventricle has normal function. The left ventricle has no regional  wall motion abnormalities. There is moderate concentric left ventricular  hypertrophy. Left ventricular  diastolic parameters are consistent with Grade I diastolic dysfunction  (impaired relaxation). The average left ventricular global longitudinal  strain is  -15.5 %. The global longitudinal strain is abnormal.   2. Right ventricular systolic function is normal. The right ventricular  size is normal. There is normal pulmonary artery systolic pressure. The  estimated right ventricular systolic pressure is 35.6 mmHg.   3. Left atrial size was mildly dilated.   4. Right atrial size was mildly dilated.   5. The mitral valve is normal in structure. No evidence of mitral valve  regurgitation. No evidence of mitral stenosis.   6. The aortic valve is tricuspid. There is severe calcifcation of the  aortic valve. Aortic valve regurgitation is mild. Severe aortic valve  stenosis. Aortic valve mean gradient measures 36.0 mmHg, AVA 0.95 cm^2.   7. The inferior vena cava is dilated in size with <50% respiratory  variability, suggesting right atrial pressure of 15 mmHg.    Assessment / Plan: 1. Chronic systolic CHF.  He has  a history of cardiomyopathy- nonischemic. EF normalized in past when compliant with medication. Most recent Echo in 2022 showed improved EF to 45-50% with moderate LVH. Reinforced importance of compliance with medical therapy. Low sodium diet. Continue  Coreg , hydralazine . Appears to be euvolemic. On lasix  20 mg daily. Avoid ARB/ACEi due to CKD. Will update Echo now.   2. HTN- well controlled.  3. PAD s/p redo right fem-pop bypass last June 2020. S/p aortobifemoral BPG Feb. 2016. Angiography in August 2022 showed patent grafts. Dopplers in August 2023 stable.   4. Hyperlipidemia. on Crestor  . Will check labs today. Goal LDL < 70  5. Biscuspid AV with mild AS/moderate AI. Will update Echo  6. Tobacco abuse- quit   7. DM now on insulin  per primary care. Last A1c 6.2%.   8. Advanced emphysema. On home oxygen intermittently. On Trelegy  9. Afib in setting of sepsis. In NSR now. Continue Eliquis . No clinical recurrence.   10. CKD stage 3a  Follow up in one year if Echo stable.

## 2024-03-17 NOTE — ED Notes (Signed)
 Food provided to the patient

## 2024-03-17 NOTE — ED Notes (Signed)
 Called [facility] for a transport to home. ETA unknown-wife and pt made aware.

## 2024-03-17 NOTE — ED Provider Notes (Signed)
 [place] [facility]PARTMENT AT [facility] Provider Note   CSN: [id number] Arrival date & time: 03/17/24  1057     History  Chief Complaint  Patient presents with   Code Sepsis    Kyle [name] is a 73 y.o. male.  Pt is a 73 yo male with pmhx significant for aortic insufficiency, HTN, dilated cardiomyopathy, CHF, COPD (on 4.5L chronically), pulmonary htn, ckd, afib (on eliquis ), and CVA with right sided weakness.  [name] noticed pt had a fever this am.  She gave him 500 mg tylenol  pta.  She said his urine has been strong.  He said he feels fine.       Home Medications Prior to Admission medications   Medication Sig Start Date End Date Taking? Authorizing Provider  doxycycline  (VIBRAMYCIN ) 100 MG capsule Take 1 capsule (100 mg total) by mouth 2 (two) times daily. 03/17/24  Yes Sueellen Emery, MD  acetaminophen  (TYLENOL ) 325 MG tablet Take 2 tablets (650 mg total) by mouth every 4 (four) hours as needed for mild pain (pain score 1-3). 09/27/23   Love, Renay Carota, PA-C  albuterol  (PROVENTIL ) (2.5 MG/3ML) 0.083% nebulizer solution Take 3 mLs (2.5 mg total) by nebulization every 4 (four) hours as needed for wheezing or shortness of breath. 12/08/23   Unk Garb, DO  amLODipine  (NORVASC ) 5 MG tablet Take 5 mg by mouth at bedtime.    [provider]  apixaban  (ELIQUIS ) 5 MG TABS tablet TAKE 1 TABLET TWICE DAILY Patient taking differently: Take 5 mg by mouth in the morning and at bedtime. 11/17/23   [place], Peter M, MD  azithromycin  (ZITHROMAX ) 500 MG tablet Take 1 tablet (500 mg total) by mouth daily. 03/09/24   Clem Currier, DO  carvedilol  (COREG ) 6.25 MG tablet Take 1 tablet (6.25 mg total) by mouth 2 (two) times daily with a meal. 03/02/24   [place], Peter M, MD  DROPLET PEN NEEDLES 32G X 4 MM MISC USE AS DIRECTED WITH INSULIN  01/03/24   Genora Kidd, MD  Fluticasone -Umeclidin-Vilant (TRELEGY ELLIPTA ) 200-62.5-25 MCG/ACT AEPB INHALE 1 PUFF EVERY DAY Patient  taking differently: Inhale 1 puff into the lungs in the morning. 11/15/23   Lind Repine, MD  gabapentin  (NEURONTIN ) 600 MG tablet Take 1 tablet (600 mg total) by mouth at bedtime. 01/31/24   Genora Kidd, MD  hydrALAZINE  (APRESOLINE ) 25 MG tablet Take 25 mg by mouth in the morning, at noon, and at bedtime.    [provider]  isosorbide  mononitrate (IMDUR ) 30 MG 24 hr tablet TAKE 1 TABLET EVERY DAY 03/02/24   [place], Peter M, MD  levETIRAcetam  (KEPPRA ) 500 MG tablet Take 1 tablet (500 mg total) by mouth 2 (two) times daily. 01/11/24   Genora Kidd, MD  mirtazapine  (REMERON ) 7.5 MG tablet Take 1 tablet (7.5 mg total) by mouth at bedtime. 01/31/24 04/30/24  Genora Kidd, MD  MUCINEX  600 MG 12 hr tablet Take 600 mg by mouth 2 (two) times daily.    [provider]  Multiple Vitamins-Minerals (CENTRUM MEN) TABS Take 1 tablet by mouth daily with breakfast.    [provider]  NOVOLOG  FLEXPEN 100 UNIT/ML FlexPen Inject 0-10 Units into the skin See admin instructions. Inject 0-10 units into the skin three times a day before meals, per Sliding Scale: BGL 0-59 OR 400-1,000 = CALL MD; 60-150 = give nothing; 151-199 = 2 units; 200-249 = 4 units; 250-299 = 6 units; 300-349 = 8 units; 350-399 = 10 units  [provider]  OXYGEN Inhale 4 L/min into the lungs continuous.    [provider]  pantoprazole  (PROTONIX ) 40 MG tablet Take 1 tablet (40 mg total) by mouth daily. 02/03/24   Jagadish, Mayuri, MD  polyethylene glycol (MIRALAX  / GLYCOLAX ) 17 g packet Take 17 g by mouth 2 (two) times daily as needed for mild constipation or moderate constipation.    [provider]  predniSONE  (DELTASONE ) 20 MG tablet Take 2 tablets (40 mg total) by mouth daily with breakfast. 03/09/24   Clem Currier, DO  rosuvastatin  (CRESTOR ) 10 MG tablet TAKE 1 TABLET AT BEDTIME 08/30/23   Adine Hoof, MD  TRESIBA  FLEXTOUCH 100 UNIT/ML FlexTouch Pen Inject 5 Units into the  skin in the morning.    [provider]      Allergies    Patient has no known allergies.    Review of Systems   Review of Systems  Constitutional:  Positive for fever.  All other systems reviewed and are negative.   Physical Exam Updated Vital Signs BP (!) 149/70   Pulse 69   Temp 98.3 F (36.8 C) (Oral)   Resp 18   Ht 5\' 11"  (1.803 m)   Wt 69.7 kg   SpO2 96%   BMI 21.43 kg/m  Physical Exam Vitals and nursing note reviewed.  Constitutional:      Appearance: Normal appearance.  HENT:     Head: Normocephalic and atraumatic.     Right Ear: External ear normal.     Left Ear: External ear normal.     Nose: Nose normal.     Mouth/Throat:     Mouth: Mucous membranes are moist.     Pharynx: Oropharynx is clear.  Eyes:     Extraocular Movements: Extraocular movements intact.     Conjunctiva/sclera: Conjunctivae normal.     Pupils: Pupils are equal, round, and reactive to light.  Cardiovascular:     Rate and Rhythm: Regular rhythm. Tachycardia present.     Pulses: Normal pulses.     Heart sounds: Normal heart sounds.  Pulmonary:     Effort: Pulmonary effort is normal.     Breath sounds: Normal breath sounds.  Abdominal:     General: Abdomen is flat. Bowel sounds are normal.     Palpations: Abdomen is soft.  Musculoskeletal:     Cervical back: Normal range of motion and neck supple.     Comments: Contracture right arm, muscle wasting right leg  Skin:    General: Skin is warm.     Capillary Refill: Capillary refill takes less than 2 seconds.  Neurological:     Mental Status: He is alert and oriented to person, place, and time. Mental status is at baseline.     Comments: Right sided weakness (chronic)     ED Results / Procedures / Treatments   Labs (all labs ordered are listed, but only abnormal results are displayed) Labs Reviewed  COMPREHENSIVE METABOLIC PANEL WITH GFR - Abnormal; Notable for the following components:      Result Value   Creatinine,  Ser 1.41 (*)    Calcium  8.7 (*)    Albumin  3.0 (*)    GFR, Estimated 53 (*)    All other components within normal limits  CBC WITH DIFFERENTIAL/PLATELET - Abnormal; Notable for the following components:   RBC 3.62 (*)    Hemoglobin 10.8 (*)    HCT 34.2 (*)    RDW 15.9 (*)    Abs Immature Granulocytes  0.08 (*)    All other components within normal limits  PROTIME-INR - Abnormal; Notable for the following components:   Prothrombin  Time 19.0 (*)    INR 1.6 (*)    All other components within normal limits  URINALYSIS, W/ REFLEX TO CULTURE (INFECTION SUSPECTED) - Abnormal; Notable for the following components:   Color, Urine STRAW (*)    Hgb urine dipstick SMALL (*)    All other components within normal limits  RESP PANEL BY RT-PCR (RSV, FLU A&B, COVID)  RVPGX2  CULTURE, BLOOD (ROUTINE X 2)  CULTURE, BLOOD (ROUTINE X 2)  I-STAT CG4 LACTIC ACID, ED  I-STAT CG4 LACTIC ACID, ED    EKG EKG Interpretation Date/Time:  Friday Mar 17 2024 11:53:10 EDT Ventricular Rate:  84 PR Interval:  155 QRS Duration:  92 QT Interval:  389 QTC Calculation: 460 R Axis:   15  Text Interpretation: Sinus rhythm Atrial premature complex Posterior infarct, old No significant change since last tracing Confirmed by Sueellen Emery [phone]) on 03/17/2024 12:12:49 PM  Radiology DG Chest Port 1 View Result Date: 03/17/2024 CLINICAL DATA:  Questionable sepsis - evaluate for abnormality EXAM: PORTABLE CHEST - 1 VIEW COMPARISON:  None available. FINDINGS: The patient is abnormally rotated towards the right. Emphysema. Streaky left basilar atelectasis. No pneumothorax. Patchy airspace opacities in the medial right lung base. Cardiomegaly. Tortuous aorta with aortic atherosclerosis. No acute fracture or destructive lesion. Multilevel thoracic osteophytosis. IMPRESSION: Patchy opacities in the medial right lung base, may represent atelectasis or a developing bronchopneumonia, in the correct clinical context.  Electronically Signed   By: Rance Burrows M.D.   On: 03/17/2024 15:31    Procedures Procedures    Medications Ordered in ED Medications  lactated ringers  infusion (has no administration in time range)  lactated ringers  bolus 1,000 mL (0 mLs Intravenous Stopped 03/17/24 1212)    And  lactated ringers  bolus 1,000 mL (0 mLs Intravenous Stopped 03/17/24 1243)    And  lactated ringers  bolus 250 mL (has no administration in time range)  ceFEPIme (MAXIPIME) 2 g in sodium chloride  0.9 % 100 mL IVPB (0 g Intravenous Stopped 03/17/24 1239)  metroNIDAZOLE (FLAGYL) IVPB 500 mg (0 mg Intravenous Stopped 03/17/24 1307)  vancomycin  (VANCOCIN ) IVPB 1000 mg/200 mL premix (0 mg Intravenous Stopped 03/17/24 1346)    ED Course/ Medical Decision Making/ A&P                                 Medical Decision Making Amount and/or Complexity of Data Reviewed Labs: ordered. Radiology: ordered.  Risk Prescription drug management.   This patient presents to the ED for concern of fever, this involves an extensive number of treatment options, and is a complaint that carries with it a high risk of complications and morbidity.  The differential diagnosis includes sepsis, infection, electrolyte abn   Co morbidities that complicate the patient evaluation  aortic insufficiency, HTN, dilated cardiomyopathy, CHF, COPD (on 4.5L chronically), pulmonary htn, ckd, afib (on eliquis ), and CVA with right sided weakness   Additional history obtained:  Additional history obtained from epic chart review External records from outside source obtained and reviewed including EMS report   Lab Tests:  I Ordered, and personally interpreted labs.  The pertinent results include:  lactic nl at 1.5, cbc nl other than hgb sl low at 10.8 (12.3 on 5/14, but it's been running in the mid-10 range for months); cmp with cr  1.41 (stable); inr 1.6, ua neg for UTI; covid/flu/rsv   Imaging Studies ordered:  I ordered imaging studies  including cxr  I independently visualized and interpreted imaging which showed  Patchy opacities in the medial right lung base, may represent  atelectasis or a developing bronchopneumonia, in the correct  clinical context.   I agree with the radiologist interpretation   Cardiac Monitoring:  The patient was maintained on a cardiac monitor.  I personally viewed and interpreted the cardiac monitored which showed an underlying rhythm of: st   Medicines ordered and prescription drug management:  I ordered medication including maxipime, flagyl, vancomycin   for possible sepsis  Reevaluation of the patient after these medicines showed that the patient improved I have reviewed the patients home medicines and have made adjustments as needed   Test Considered:  ct   Critical Interventions:  abx   Problem List / ED Course:  CAP:  Pt is not hypoxic.  He is not sob.  His vitals have normalized and wants to go home.  I think pt can go.  He and his wife know to return if worse.  F/u with pcp.   Reevaluation:  After the interventions noted above, I reevaluated the patient and found that they have :improved   Social Determinants of Health:  Lives at home   Dispostion:  After consideration of the diagnostic results and the patients response to treatment, I feel that the patent would benefit from discharge with outpatient f/u.          Final Clinical Impression(s) / ED Diagnoses Final diagnoses:  Community acquired pneumonia of right middle lobe of lung    Rx / DC Orders ED Discharge Orders          Ordered    doxycycline  (VIBRAMYCIN ) 100 MG capsule  2 times daily        03/17/24 1637              Sueellen Emery, MD 03/17/24 1637

## 2024-03-17 NOTE — Sepsis Progress Note (Signed)
 Code sepsis protocol being monitored by eLink.

## 2024-03-17 NOTE — ED Triage Notes (Signed)
 BIB by ems from home for fever this morning, possible UTI. Normally on oxygen at 4 LPM per nasal cannula. Denies N/V or shortness of breath. Right side weakness R/T previous stroke.  G20 left AC inserted by EMS. LR infused by EMS. Glucose=205mg /dl. Insulin  4 units given by wife at home (sliding scale). Tylenol  500 mg given OTC.

## 2024-03-20 DIAGNOSIS — I130 Hypertensive heart and chronic kidney disease with heart failure and stage 1 through stage 4 chronic kidney disease, or unspecified chronic kidney disease: Secondary | ICD-10-CM | POA: Diagnosis not present

## 2024-03-20 DIAGNOSIS — L89312 Pressure ulcer of right buttock, stage 2: Secondary | ICD-10-CM | POA: Diagnosis not present

## 2024-03-20 DIAGNOSIS — E1151 Type 2 diabetes mellitus with diabetic peripheral angiopathy without gangrene: Secondary | ICD-10-CM | POA: Diagnosis not present

## 2024-03-20 DIAGNOSIS — I69392 Facial weakness following cerebral infarction: Secondary | ICD-10-CM | POA: Diagnosis not present

## 2024-03-20 DIAGNOSIS — I69322 Dysarthria following cerebral infarction: Secondary | ICD-10-CM | POA: Diagnosis not present

## 2024-03-20 DIAGNOSIS — I69351 Hemiplegia and hemiparesis following cerebral infarction affecting right dominant side: Secondary | ICD-10-CM | POA: Diagnosis not present

## 2024-03-20 DIAGNOSIS — E1122 Type 2 diabetes mellitus with diabetic chronic kidney disease: Secondary | ICD-10-CM | POA: Diagnosis not present

## 2024-03-20 DIAGNOSIS — J449 Chronic obstructive pulmonary disease, unspecified: Secondary | ICD-10-CM | POA: Diagnosis not present

## 2024-03-20 DIAGNOSIS — I5022 Chronic systolic (congestive) heart failure: Secondary | ICD-10-CM | POA: Diagnosis not present

## 2024-03-21 DIAGNOSIS — I5022 Chronic systolic (congestive) heart failure: Secondary | ICD-10-CM | POA: Diagnosis not present

## 2024-03-21 DIAGNOSIS — I69322 Dysarthria following cerebral infarction: Secondary | ICD-10-CM | POA: Diagnosis not present

## 2024-03-21 DIAGNOSIS — I130 Hypertensive heart and chronic kidney disease with heart failure and stage 1 through stage 4 chronic kidney disease, or unspecified chronic kidney disease: Secondary | ICD-10-CM | POA: Diagnosis not present

## 2024-03-21 DIAGNOSIS — J449 Chronic obstructive pulmonary disease, unspecified: Secondary | ICD-10-CM | POA: Diagnosis not present

## 2024-03-21 DIAGNOSIS — E1122 Type 2 diabetes mellitus with diabetic chronic kidney disease: Secondary | ICD-10-CM | POA: Diagnosis not present

## 2024-03-21 DIAGNOSIS — E1151 Type 2 diabetes mellitus with diabetic peripheral angiopathy without gangrene: Secondary | ICD-10-CM | POA: Diagnosis not present

## 2024-03-21 DIAGNOSIS — I69351 Hemiplegia and hemiparesis following cerebral infarction affecting right dominant side: Secondary | ICD-10-CM | POA: Diagnosis not present

## 2024-03-21 DIAGNOSIS — L89312 Pressure ulcer of right buttock, stage 2: Secondary | ICD-10-CM | POA: Diagnosis not present

## 2024-03-21 DIAGNOSIS — I69392 Facial weakness following cerebral infarction: Secondary | ICD-10-CM | POA: Diagnosis not present

## 2024-03-22 LAB — CULTURE, BLOOD (ROUTINE X 2)
Culture: NO GROWTH
Culture: NO GROWTH
Special Requests: ADEQUATE

## 2024-03-23 ENCOUNTER — Telehealth: Payer: Self-pay

## 2024-03-23 DIAGNOSIS — J9612 Chronic respiratory failure with hypercapnia: Secondary | ICD-10-CM | POA: Diagnosis not present

## 2024-03-23 DIAGNOSIS — J441 Chronic obstructive pulmonary disease with (acute) exacerbation: Secondary | ICD-10-CM | POA: Diagnosis not present

## 2024-03-23 DIAGNOSIS — E1151 Type 2 diabetes mellitus with diabetic peripheral angiopathy without gangrene: Secondary | ICD-10-CM | POA: Diagnosis not present

## 2024-03-23 DIAGNOSIS — I69322 Dysarthria following cerebral infarction: Secondary | ICD-10-CM | POA: Diagnosis not present

## 2024-03-23 DIAGNOSIS — I69392 Facial weakness following cerebral infarction: Secondary | ICD-10-CM | POA: Diagnosis not present

## 2024-03-23 DIAGNOSIS — I130 Hypertensive heart and chronic kidney disease with heart failure and stage 1 through stage 4 chronic kidney disease, or unspecified chronic kidney disease: Secondary | ICD-10-CM | POA: Diagnosis not present

## 2024-03-23 DIAGNOSIS — I69351 Hemiplegia and hemiparesis following cerebral infarction affecting right dominant side: Secondary | ICD-10-CM | POA: Diagnosis not present

## 2024-03-23 DIAGNOSIS — I5022 Chronic systolic (congestive) heart failure: Secondary | ICD-10-CM | POA: Diagnosis not present

## 2024-03-23 DIAGNOSIS — J9621 Acute and chronic respiratory failure with hypoxia: Secondary | ICD-10-CM | POA: Diagnosis not present
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
## 2024-03-24 ENCOUNTER — Ambulatory Visit: Admitting: Cardiology

## 2024-03-24 DIAGNOSIS — Z9981 Dependence on supplemental oxygen: Secondary | ICD-10-CM | POA: Diagnosis not present

## 2024-03-24 DIAGNOSIS — J449 Chronic obstructive pulmonary disease, unspecified: Secondary | ICD-10-CM | POA: Diagnosis not present

## 2024-03-24 DIAGNOSIS — G8191 Hemiplegia, unspecified affecting right dominant side: Secondary | ICD-10-CM | POA: Diagnosis not present

## 2024-03-24 DIAGNOSIS — L89153 Pressure ulcer of sacral region, stage 3: Secondary | ICD-10-CM | POA: Diagnosis not present

## 2024-03-24 DIAGNOSIS — I6910 Unspecified sequelae of nontraumatic intracerebral hemorrhage: Secondary | ICD-10-CM | POA: Diagnosis not present

## 2024-03-24 DIAGNOSIS — L89314 Pressure ulcer of right buttock, stage 4: Secondary | ICD-10-CM | POA: Diagnosis not present

## 2024-03-24 DIAGNOSIS — I679 Cerebrovascular disease, unspecified: Secondary | ICD-10-CM | POA: Diagnosis not present

## 2024-03-25 ENCOUNTER — Other Ambulatory Visit: Payer: Self-pay | Admitting: Cardiology

## 2024-03-27 DIAGNOSIS — E1151 Type 2 diabetes mellitus with diabetic peripheral angiopathy without gangrene: Secondary | ICD-10-CM | POA: Diagnosis not present

## 2024-03-27 DIAGNOSIS — I5022 Chronic systolic (congestive) heart failure: Secondary | ICD-10-CM | POA: Diagnosis not present

## 2024-03-27 DIAGNOSIS — I69322 Dysarthria following cerebral infarction: Secondary | ICD-10-CM | POA: Diagnosis not present

## 2024-03-27 DIAGNOSIS — J9621 Acute and chronic respiratory failure with hypoxia: Secondary | ICD-10-CM | POA: Diagnosis not present

## 2024-03-27 DIAGNOSIS — J441 Chronic obstructive pulmonary disease with (acute) exacerbation: Secondary | ICD-10-CM | POA: Diagnosis not present

## 2024-03-27 DIAGNOSIS — I130 Hypertensive heart and chronic kidney disease with heart failure and stage 1 through stage 4 chronic kidney disease, or unspecified chronic kidney disease: Secondary | ICD-10-CM | POA: Diagnosis not present

## 2024-03-27 DIAGNOSIS — I69392 Facial weakness following cerebral infarction: Secondary | ICD-10-CM | POA: Diagnosis not present

## 2024-03-27 DIAGNOSIS — J9612 Chronic respiratory failure with hypercapnia: Secondary | ICD-10-CM | POA: Diagnosis not present

## 2024-03-27 DIAGNOSIS — I69351 Hemiplegia and hemiparesis following cerebral infarction affecting right dominant side: Secondary | ICD-10-CM | POA: Diagnosis not present

## 2024-03-28 ENCOUNTER — Other Ambulatory Visit: Payer: Self-pay

## 2024-03-28 MED ORDER — HYDRALAZINE HCL 25 MG PO TABS: 25.0000 mg | ORAL_TABLET | Freq: Three times a day (TID) | ORAL | 0 refills | Status: DC

## 2024-03-29 DIAGNOSIS — I69351 Hemiplegia and hemiparesis following cerebral infarction affecting right dominant side: Secondary | ICD-10-CM | POA: Diagnosis not present

## 2024-03-29 DIAGNOSIS — J9612 Chronic respiratory failure with hypercapnia: Secondary | ICD-10-CM | POA: Diagnosis not present

## 2024-03-29 DIAGNOSIS — I130 Hypertensive heart and chronic kidney disease with heart failure and stage 1 through stage 4 chronic kidney disease, or unspecified chronic kidney disease: Secondary | ICD-10-CM | POA: Diagnosis not present

## 2024-03-29 DIAGNOSIS — J9621 Acute and chronic respiratory failure with hypoxia: Secondary | ICD-10-CM | POA: Diagnosis not present

## 2024-03-29 DIAGNOSIS — I69322 Dysarthria following cerebral infarction: Secondary | ICD-10-CM | POA: Diagnosis not present

## 2024-03-29 DIAGNOSIS — I69392 Facial weakness following cerebral infarction: Secondary | ICD-10-CM | POA: Diagnosis not present

## 2024-03-29 DIAGNOSIS — J441 Chronic obstructive pulmonary disease with (acute) exacerbation: Secondary | ICD-10-CM | POA: Diagnosis not present

## 2024-03-29 DIAGNOSIS — I5022 Chronic systolic (congestive) heart failure: Secondary | ICD-10-CM | POA: Diagnosis not present

## 2024-03-29 DIAGNOSIS — E1151 Type 2 diabetes mellitus with diabetic peripheral angiopathy without gangrene: Secondary | ICD-10-CM | POA: Diagnosis not present

## 2024-03-31 DIAGNOSIS — I69322 Dysarthria following cerebral infarction: Secondary | ICD-10-CM | POA: Diagnosis not present

## 2024-03-31 DIAGNOSIS — J441 Chronic obstructive pulmonary disease with (acute) exacerbation: Secondary | ICD-10-CM | POA: Diagnosis not present

## 2024-03-31 DIAGNOSIS — E1151 Type 2 diabetes mellitus with diabetic peripheral angiopathy without gangrene: Secondary | ICD-10-CM | POA: Diagnosis not present

## 2024-03-31 DIAGNOSIS — J9612 Chronic respiratory failure with hypercapnia: Secondary | ICD-10-CM | POA: Diagnosis not present

## 2024-03-31 DIAGNOSIS — I130 Hypertensive heart and chronic kidney disease with heart failure and stage 1 through stage 4 chronic kidney disease, or unspecified chronic kidney disease: Secondary | ICD-10-CM | POA: Diagnosis not present

## 2024-03-31 DIAGNOSIS — J9621 Acute and chronic respiratory failure with hypoxia: Secondary | ICD-10-CM | POA: Diagnosis not present

## 2024-03-31 DIAGNOSIS — I69392 Facial weakness following cerebral infarction: Secondary | ICD-10-CM | POA: Diagnosis not present

## 2024-03-31 DIAGNOSIS — I69351 Hemiplegia and hemiparesis following cerebral infarction affecting right dominant side: Secondary | ICD-10-CM | POA: Diagnosis not present

## 2024-03-31 DIAGNOSIS — I5022 Chronic systolic (congestive) heart failure: Secondary | ICD-10-CM | POA: Diagnosis not present

## 2024-04-03 DIAGNOSIS — J9621 Acute and chronic respiratory failure with hypoxia: Secondary | ICD-10-CM | POA: Diagnosis not present

## 2024-04-03 DIAGNOSIS — I69392 Facial weakness following cerebral infarction: Secondary | ICD-10-CM | POA: Diagnosis not present

## 2024-04-03 DIAGNOSIS — J441 Chronic obstructive pulmonary disease with (acute) exacerbation: Secondary | ICD-10-CM | POA: Diagnosis not present

## 2024-04-03 DIAGNOSIS — I69322 Dysarthria following cerebral infarction: Secondary | ICD-10-CM | POA: Diagnosis not present

## 2024-04-03 DIAGNOSIS — E1151 Type 2 diabetes mellitus with diabetic peripheral angiopathy without gangrene: Secondary | ICD-10-CM | POA: Diagnosis not present

## 2024-04-03 DIAGNOSIS — I130 Hypertensive heart and chronic kidney disease with heart failure and stage 1 through stage 4 chronic kidney disease, or unspecified chronic kidney disease: Secondary | ICD-10-CM | POA: Diagnosis not present

## 2024-04-03 DIAGNOSIS — I69351 Hemiplegia and hemiparesis following cerebral infarction affecting right dominant side: Secondary | ICD-10-CM | POA: Diagnosis not present

## 2024-04-03 DIAGNOSIS — I5022 Chronic systolic (congestive) heart failure: Secondary | ICD-10-CM | POA: Diagnosis not present

## 2024-04-03 DIAGNOSIS — J9612 Chronic respiratory failure with hypercapnia: Secondary | ICD-10-CM | POA: Diagnosis not present

## 2024-04-04 ENCOUNTER — Encounter: Payer: Self-pay | Admitting: *Deleted

## 2024-04-04 DIAGNOSIS — J9612 Chronic respiratory failure with hypercapnia: Secondary | ICD-10-CM | POA: Diagnosis not present

## 2024-04-04 DIAGNOSIS — J9621 Acute and chronic respiratory failure with hypoxia: Secondary | ICD-10-CM | POA: Diagnosis not present

## 2024-04-04 DIAGNOSIS — I69392 Facial weakness following cerebral infarction: Secondary | ICD-10-CM | POA: Diagnosis not present

## 2024-04-04 DIAGNOSIS — I69351 Hemiplegia and hemiparesis following cerebral infarction affecting right dominant side: Secondary | ICD-10-CM | POA: Diagnosis not present

## 2024-04-04 DIAGNOSIS — E1151 Type 2 diabetes mellitus with diabetic peripheral angiopathy without gangrene: Secondary | ICD-10-CM | POA: Diagnosis not present

## 2024-04-04 DIAGNOSIS — I130 Hypertensive heart and chronic kidney disease with heart failure and stage 1 through stage 4 chronic kidney disease, or unspecified chronic kidney disease: Secondary | ICD-10-CM | POA: Diagnosis not present

## 2024-04-04 DIAGNOSIS — J441 Chronic obstructive pulmonary disease with (acute) exacerbation: Secondary | ICD-10-CM | POA: Diagnosis not present

## 2024-04-04 DIAGNOSIS — I5022 Chronic systolic (congestive) heart failure: Secondary | ICD-10-CM | POA: Diagnosis not present

## 2024-04-04 DIAGNOSIS — I69322 Dysarthria following cerebral infarction: Secondary | ICD-10-CM | POA: Diagnosis not present

## 2024-04-05 ENCOUNTER — Other Ambulatory Visit: Payer: Self-pay

## 2024-04-05 DIAGNOSIS — I5022 Chronic systolic (congestive) heart failure: Secondary | ICD-10-CM | POA: Diagnosis not present

## 2024-04-05 DIAGNOSIS — E1151 Type 2 diabetes mellitus with diabetic peripheral angiopathy without gangrene: Secondary | ICD-10-CM | POA: Diagnosis not present

## 2024-04-05 DIAGNOSIS — I130 Hypertensive heart and chronic kidney disease with heart failure and stage 1 through stage 4 chronic kidney disease, or unspecified chronic kidney disease: Secondary | ICD-10-CM | POA: Diagnosis not present

## 2024-04-05 DIAGNOSIS — J9612 Chronic respiratory failure with hypercapnia: Secondary | ICD-10-CM | POA: Diagnosis not present

## 2024-04-05 DIAGNOSIS — I69322 Dysarthria following cerebral infarction: Secondary | ICD-10-CM | POA: Diagnosis not present

## 2024-04-05 DIAGNOSIS — I69392 Facial weakness following cerebral infarction: Secondary | ICD-10-CM | POA: Diagnosis not present

## 2024-04-05 DIAGNOSIS — J9621 Acute and chronic respiratory failure with hypoxia: Secondary | ICD-10-CM | POA: Diagnosis not present

## 2024-04-05 DIAGNOSIS — J441 Chronic obstructive pulmonary disease with (acute) exacerbation: Secondary | ICD-10-CM | POA: Diagnosis not present

## 2024-04-05 DIAGNOSIS — I69351 Hemiplegia and hemiparesis following cerebral infarction affecting right dominant side: Secondary | ICD-10-CM | POA: Diagnosis not present

## 2024-04-05 MED ORDER — GABAPENTIN 600 MG PO TABS: 600.0000 mg | ORAL_TABLET | Freq: Every day | ORAL | 0 refills | Status: DC

## 2024-04-06 DIAGNOSIS — I69322 Dysarthria following cerebral infarction: Secondary | ICD-10-CM | POA: Diagnosis not present

## 2024-04-06 DIAGNOSIS — I69392 Facial weakness following cerebral infarction: Secondary | ICD-10-CM | POA: Diagnosis not present

## 2024-04-06 DIAGNOSIS — J9612 Chronic respiratory failure with hypercapnia: Secondary | ICD-10-CM | POA: Diagnosis not present

## 2024-04-06 DIAGNOSIS — J441 Chronic obstructive pulmonary disease with (acute) exacerbation: Secondary | ICD-10-CM | POA: Diagnosis not present

## 2024-04-06 DIAGNOSIS — J9621 Acute and chronic respiratory failure with hypoxia: Secondary | ICD-10-CM | POA: Diagnosis not present

## 2024-04-06 DIAGNOSIS — E1151 Type 2 diabetes mellitus with diabetic peripheral angiopathy without gangrene: Secondary | ICD-10-CM | POA: Diagnosis not present

## 2024-04-06 DIAGNOSIS — I130 Hypertensive heart and chronic kidney disease with heart failure and stage 1 through stage 4 chronic kidney disease, or unspecified chronic kidney disease: Secondary | ICD-10-CM | POA: Diagnosis not present

## 2024-04-06 DIAGNOSIS — I5022 Chronic systolic (congestive) heart failure: Secondary | ICD-10-CM | POA: Diagnosis not present

## 2024-04-06 DIAGNOSIS — I69351 Hemiplegia and hemiparesis following cerebral infarction affecting right dominant side: Secondary | ICD-10-CM | POA: Diagnosis not present

## 2024-04-10 DIAGNOSIS — J9621 Acute and chronic respiratory failure with hypoxia: Secondary | ICD-10-CM | POA: Diagnosis not present

## 2024-04-10 DIAGNOSIS — I69322 Dysarthria following cerebral infarction: Secondary | ICD-10-CM | POA: Diagnosis not present

## 2024-04-10 DIAGNOSIS — J441 Chronic obstructive pulmonary disease with (acute) exacerbation: Secondary | ICD-10-CM | POA: Diagnosis not present

## 2024-04-10 DIAGNOSIS — J9612 Chronic respiratory failure with hypercapnia: Secondary | ICD-10-CM | POA: Diagnosis not present

## 2024-04-10 DIAGNOSIS — I69392 Facial weakness following cerebral infarction: Secondary | ICD-10-CM | POA: Diagnosis not present

## 2024-04-10 DIAGNOSIS — I130 Hypertensive heart and chronic kidney disease with heart failure and stage 1 through stage 4 chronic kidney disease, or unspecified chronic kidney disease: Secondary | ICD-10-CM | POA: Diagnosis not present

## 2024-04-10 DIAGNOSIS — I5022 Chronic systolic (congestive) heart failure: Secondary | ICD-10-CM | POA: Diagnosis not present

## 2024-04-10 DIAGNOSIS — I69351 Hemiplegia and hemiparesis following cerebral infarction affecting right dominant side: Secondary | ICD-10-CM | POA: Diagnosis not present

## 2024-04-10 DIAGNOSIS — E1151 Type 2 diabetes mellitus with diabetic peripheral angiopathy without gangrene: Secondary | ICD-10-CM | POA: Diagnosis not present

## 2024-04-11 ENCOUNTER — Other Ambulatory Visit: Payer: Self-pay

## 2024-04-11 ENCOUNTER — Emergency Department (HOSPITAL_COMMUNITY)
Admission: EM | Admit: 2024-04-11 | Discharge: 2024-04-12 | Discharge status: Against Medical Advice | Attending: Emergency Medicine | Admitting: Emergency Medicine

## 2024-04-11 DIAGNOSIS — Z79899 Other long term (current) drug therapy: Secondary | ICD-10-CM | POA: Diagnosis not present

## 2024-04-11 DIAGNOSIS — T22229A Burn of second degree of unspecified elbow, initial encounter: Secondary | ICD-10-CM | POA: Diagnosis not present

## 2024-04-11 DIAGNOSIS — R0902 Hypoxemia: Secondary | ICD-10-CM | POA: Diagnosis not present

## 2024-04-11 DIAGNOSIS — T310 Burns involving less than 10% of body surface: Secondary | ICD-10-CM | POA: Diagnosis not present

## 2024-04-11 DIAGNOSIS — J449 Chronic obstructive pulmonary disease, unspecified: Secondary | ICD-10-CM | POA: Insufficient documentation

## 2024-04-11 DIAGNOSIS — I4891 Unspecified atrial fibrillation: Secondary | ICD-10-CM | POA: Diagnosis not present

## 2024-04-11 DIAGNOSIS — Z7951 Long term (current) use of inhaled steroids: Secondary | ICD-10-CM | POA: Insufficient documentation

## 2024-04-11 DIAGNOSIS — T22231A Burn of second degree of right upper arm, initial encounter: Secondary | ICD-10-CM | POA: Insufficient documentation

## 2024-04-11 DIAGNOSIS — I130 Hypertensive heart and chronic kidney disease with heart failure and stage 1 through stage 4 chronic kidney disease, or unspecified chronic kidney disease: Secondary | ICD-10-CM | POA: Insufficient documentation

## 2024-04-11 DIAGNOSIS — T300 Burn of unspecified body region, unspecified degree: Secondary | ICD-10-CM

## 2024-04-11 DIAGNOSIS — Z23 Encounter for immunization: Secondary | ICD-10-CM | POA: Diagnosis not present

## 2024-04-11 DIAGNOSIS — E1122 Type 2 diabetes mellitus with diabetic chronic kidney disease: Secondary | ICD-10-CM | POA: Diagnosis not present

## 2024-04-11 DIAGNOSIS — X100XXA Contact with hot drinks, initial encounter: Secondary | ICD-10-CM | POA: Insufficient documentation

## 2024-04-11 DIAGNOSIS — Z5329 Procedure and treatment not carried out because of patient's decision for other reasons: Secondary | ICD-10-CM | POA: Insufficient documentation

## 2024-04-11 DIAGNOSIS — T22211A Burn of second degree of right forearm, initial encounter: Secondary | ICD-10-CM | POA: Diagnosis not present

## 2024-04-11 DIAGNOSIS — N189 Chronic kidney disease, unspecified: Secondary | ICD-10-CM | POA: Diagnosis not present

## 2024-04-11 DIAGNOSIS — I5020 Unspecified systolic (congestive) heart failure: Secondary | ICD-10-CM | POA: Diagnosis not present

## 2024-04-11 MED ORDER — TETANUS-DIPHTH-ACELL PERTUSSIS 5-2.5-18.5 LF-MCG/0.5 IM SUSY
0.5000 mL | PREFILLED_SYRINGE | Freq: Once | INTRAMUSCULAR | Status: AC
  Administered 2024-04-11: 0.5 mL via INTRAMUSCULAR
  Filled 2024-04-11 (×2): qty 0.5

## 2024-04-11 MED ORDER — HYDRALAZINE HCL 25 MG PO TABS
25.0000 mg | ORAL_TABLET | Freq: Once | ORAL | Status: AC
  Administered 2024-04-11: 25 mg via ORAL
  Filled 2024-04-11: qty 1
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
## 2024-04-11 NOTE — ED Notes (Signed)
 [facility] called for transport. No ETA given.

## 2024-04-11 NOTE — ED Notes (Signed)
 Pt moved from TRA A to Hallway 9. Verbal report given to RN Arlys Berke
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
## 2024-04-12 ENCOUNTER — Telehealth: Payer: Self-pay

## 2024-04-12 DIAGNOSIS — I130 Hypertensive heart and chronic kidney disease with heart failure and stage 1 through stage 4 chronic kidney disease, or unspecified chronic kidney disease: Secondary | ICD-10-CM | POA: Diagnosis not present

## 2024-04-12 DIAGNOSIS — J9621 Acute and chronic respiratory failure with hypoxia: Secondary | ICD-10-CM | POA: Diagnosis not present

## 2024-04-12 DIAGNOSIS — I10 Essential (primary) hypertension: Secondary | ICD-10-CM | POA: Diagnosis not present

## 2024-04-12 DIAGNOSIS — J441 Chronic obstructive pulmonary disease with (acute) exacerbation: Secondary | ICD-10-CM | POA: Diagnosis not present

## 2024-04-12 DIAGNOSIS — E1151 Type 2 diabetes mellitus with diabetic peripheral angiopathy without gangrene: Secondary | ICD-10-CM | POA: Diagnosis not present

## 2024-04-12 DIAGNOSIS — Z7401 Bed confinement status: Secondary | ICD-10-CM | POA: Diagnosis not present

## 2024-04-12 DIAGNOSIS — I5022 Chronic systolic (congestive) heart failure: Secondary | ICD-10-CM | POA: Diagnosis not present

## 2024-04-12 DIAGNOSIS — I69351 Hemiplegia and hemiparesis following cerebral infarction affecting right dominant side: Secondary | ICD-10-CM | POA: Diagnosis not present

## 2024-04-12 DIAGNOSIS — J9612 Chronic respiratory failure with hypercapnia: Secondary | ICD-10-CM | POA: Diagnosis not present

## 2024-04-12 DIAGNOSIS — I69322 Dysarthria following cerebral infarction: Secondary | ICD-10-CM | POA: Diagnosis not present

## 2024-04-12 DIAGNOSIS — T300 Burn of unspecified body region, unspecified degree: Secondary | ICD-10-CM | POA: Diagnosis not present

## 2024-04-12 DIAGNOSIS — I69392 Facial weakness following cerebral infarction: Secondary | ICD-10-CM | POA: Diagnosis not present

## 2024-04-12 MED ORDER — ACETAMINOPHEN 325 MG PO TABS
650.0000 mg | ORAL_TABLET | Freq: Once | ORAL | Status: AC
  Administered 2024-04-12: 650 mg via ORAL
  Filled 2024-04-12: qty 2
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
## 2024-04-13 ENCOUNTER — Other Ambulatory Visit: Payer: Self-pay

## 2024-04-13 DIAGNOSIS — I69392 Facial weakness following cerebral infarction: Secondary | ICD-10-CM | POA: Diagnosis not present

## 2024-04-13 DIAGNOSIS — J9621 Acute and chronic respiratory failure with hypoxia: Secondary | ICD-10-CM | POA: Diagnosis not present

## 2024-04-13 DIAGNOSIS — I130 Hypertensive heart and chronic kidney disease with heart failure and stage 1 through stage 4 chronic kidney disease, or unspecified chronic kidney disease: Secondary | ICD-10-CM | POA: Diagnosis not present

## 2024-04-13 DIAGNOSIS — I69351 Hemiplegia and hemiparesis following cerebral infarction affecting right dominant side: Secondary | ICD-10-CM | POA: Diagnosis not present

## 2024-04-13 DIAGNOSIS — E1151 Type 2 diabetes mellitus with diabetic peripheral angiopathy without gangrene: Secondary | ICD-10-CM | POA: Diagnosis not present

## 2024-04-13 DIAGNOSIS — I5022 Chronic systolic (congestive) heart failure: Secondary | ICD-10-CM | POA: Diagnosis not present

## 2024-04-13 DIAGNOSIS — I69322 Dysarthria following cerebral infarction: Secondary | ICD-10-CM | POA: Diagnosis not present

## 2024-04-13 DIAGNOSIS — J9612 Chronic respiratory failure with hypercapnia: Secondary | ICD-10-CM | POA: Diagnosis not present

## 2024-04-13 DIAGNOSIS — J441 Chronic obstructive pulmonary disease with (acute) exacerbation: Secondary | ICD-10-CM | POA: Diagnosis not present

## 2024-04-13 MED ORDER — PANTOPRAZOLE SODIUM 40 MG PO TBEC: 40.0000 mg | DELAYED_RELEASE_TABLET | Freq: Every day | ORAL | 0 refills | Status: DC

## 2024-04-14 DIAGNOSIS — I69392 Facial weakness following cerebral infarction: Secondary | ICD-10-CM | POA: Diagnosis not present

## 2024-04-14 DIAGNOSIS — I130 Hypertensive heart and chronic kidney disease with heart failure and stage 1 through stage 4 chronic kidney disease, or unspecified chronic kidney disease: Secondary | ICD-10-CM | POA: Diagnosis not present

## 2024-04-14 DIAGNOSIS — J9621 Acute and chronic respiratory failure with hypoxia: Secondary | ICD-10-CM | POA: Diagnosis not present

## 2024-04-14 DIAGNOSIS — I5022 Chronic systolic (congestive) heart failure: Secondary | ICD-10-CM | POA: Diagnosis not present

## 2024-04-14 DIAGNOSIS — J441 Chronic obstructive pulmonary disease with (acute) exacerbation: Secondary | ICD-10-CM | POA: Diagnosis not present

## 2024-04-14 DIAGNOSIS — J9612 Chronic respiratory failure with hypercapnia: Secondary | ICD-10-CM | POA: Diagnosis not present

## 2024-04-14 DIAGNOSIS — I69351 Hemiplegia and hemiparesis following cerebral infarction affecting right dominant side: Secondary | ICD-10-CM | POA: Diagnosis not present

## 2024-04-14 DIAGNOSIS — I69322 Dysarthria following cerebral infarction: Secondary | ICD-10-CM | POA: Diagnosis not present

## 2024-04-14 DIAGNOSIS — E1151 Type 2 diabetes mellitus with diabetic peripheral angiopathy without gangrene: Secondary | ICD-10-CM | POA: Diagnosis not present

## 2024-04-17 DIAGNOSIS — J441 Chronic obstructive pulmonary disease with (acute) exacerbation: Secondary | ICD-10-CM | POA: Diagnosis not present

## 2024-04-17 DIAGNOSIS — E1151 Type 2 diabetes mellitus with diabetic peripheral angiopathy without gangrene: Secondary | ICD-10-CM | POA: Diagnosis not present

## 2024-04-17 DIAGNOSIS — I69392 Facial weakness following cerebral infarction: Secondary | ICD-10-CM | POA: Diagnosis not present

## 2024-04-17 DIAGNOSIS — I5022 Chronic systolic (congestive) heart failure: Secondary | ICD-10-CM | POA: Diagnosis not present

## 2024-04-17 DIAGNOSIS — I69322 Dysarthria following cerebral infarction: Secondary | ICD-10-CM | POA: Diagnosis not present

## 2024-04-17 DIAGNOSIS — J9621 Acute and chronic respiratory failure with hypoxia: Secondary | ICD-10-CM | POA: Diagnosis not present

## 2024-04-17 DIAGNOSIS — I69351 Hemiplegia and hemiparesis following cerebral infarction affecting right dominant side: Secondary | ICD-10-CM | POA: Diagnosis not present

## 2024-04-17 DIAGNOSIS — I130 Hypertensive heart and chronic kidney disease with heart failure and stage 1 through stage 4 chronic kidney disease, or unspecified chronic kidney disease: Secondary | ICD-10-CM | POA: Diagnosis not present

## 2024-04-17 DIAGNOSIS — J9612 Chronic respiratory failure with hypercapnia: Secondary | ICD-10-CM | POA: Diagnosis not present

## 2024-04-18 ENCOUNTER — Other Ambulatory Visit: Payer: Self-pay

## 2024-04-19 DIAGNOSIS — I69392 Facial weakness following cerebral infarction: Secondary | ICD-10-CM | POA: Diagnosis not present

## 2024-04-19 DIAGNOSIS — J441 Chronic obstructive pulmonary disease with (acute) exacerbation: Secondary | ICD-10-CM | POA: Diagnosis not present

## 2024-04-19 DIAGNOSIS — I130 Hypertensive heart and chronic kidney disease with heart failure and stage 1 through stage 4 chronic kidney disease, or unspecified chronic kidney disease: Secondary | ICD-10-CM | POA: Diagnosis not present

## 2024-04-19 DIAGNOSIS — I69351 Hemiplegia and hemiparesis following cerebral infarction affecting right dominant side: Secondary | ICD-10-CM | POA: Diagnosis not present

## 2024-04-19 DIAGNOSIS — E1151 Type 2 diabetes mellitus with diabetic peripheral angiopathy without gangrene: Secondary | ICD-10-CM | POA: Diagnosis not present

## 2024-04-19 DIAGNOSIS — I5022 Chronic systolic (congestive) heart failure: Secondary | ICD-10-CM | POA: Diagnosis not present

## 2024-04-19 DIAGNOSIS — J9621 Acute and chronic respiratory failure with hypoxia: Secondary | ICD-10-CM | POA: Diagnosis not present

## 2024-04-19 DIAGNOSIS — I69322 Dysarthria following cerebral infarction: Secondary | ICD-10-CM | POA: Diagnosis not present

## 2024-04-19 DIAGNOSIS — J9612 Chronic respiratory failure with hypercapnia: Secondary | ICD-10-CM | POA: Diagnosis not present

## 2024-04-20 DIAGNOSIS — I69351 Hemiplegia and hemiparesis following cerebral infarction affecting right dominant side: Secondary | ICD-10-CM | POA: Diagnosis not present

## 2024-04-20 DIAGNOSIS — I130 Hypertensive heart and chronic kidney disease with heart failure and stage 1 through stage 4 chronic kidney disease, or unspecified chronic kidney disease: Secondary | ICD-10-CM | POA: Diagnosis not present

## 2024-04-20 DIAGNOSIS — E1151 Type 2 diabetes mellitus with diabetic peripheral angiopathy without gangrene: Secondary | ICD-10-CM | POA: Diagnosis not present

## 2024-04-20 DIAGNOSIS — I5022 Chronic systolic (congestive) heart failure: Secondary | ICD-10-CM | POA: Diagnosis not present

## 2024-04-20 DIAGNOSIS — J441 Chronic obstructive pulmonary disease with (acute) exacerbation: Secondary | ICD-10-CM | POA: Diagnosis not present

## 2024-04-20 DIAGNOSIS — I69392 Facial weakness following cerebral infarction: Secondary | ICD-10-CM | POA: Diagnosis not present

## 2024-04-20 DIAGNOSIS — I69322 Dysarthria following cerebral infarction: Secondary | ICD-10-CM | POA: Diagnosis not present

## 2024-04-20 DIAGNOSIS — J9621 Acute and chronic respiratory failure with hypoxia: Secondary | ICD-10-CM | POA: Diagnosis not present

## 2024-04-20 DIAGNOSIS — J9612 Chronic respiratory failure with hypercapnia: Secondary | ICD-10-CM | POA: Diagnosis not present

## 2024-04-21 ENCOUNTER — Encounter: Payer: Self-pay | Admitting: Student

## 2024-04-21 ENCOUNTER — Ambulatory Visit (INDEPENDENT_AMBULATORY_CARE_PROVIDER_SITE_OTHER): Admitting: Student

## 2024-04-21 VITALS — BP 156/65 | HR 65 | Temp 97.8°F | Ht 71.0 in

## 2024-04-21 DIAGNOSIS — T300 Burn of unspecified body region, unspecified degree: Secondary | ICD-10-CM | POA: Diagnosis not present

## 2024-04-21 DIAGNOSIS — I10 Essential (primary) hypertension: Secondary | ICD-10-CM | POA: Diagnosis not present

## 2024-04-21 MED ORDER — OXYCODONE HCL 5 MG PO TABS
5.0000 mg | ORAL_TABLET | Freq: Every day | ORAL | 0 refills | Status: DC | PRN
Start: 2024-04-21 — End: 2024-06-23

## 2024-04-21 MED ORDER — GABAPENTIN 600 MG PO TABS: 600.0000 mg | ORAL_TABLET | Freq: Every day | ORAL | 0 refills | Status: DC
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
## 2024-04-21 NOTE — Assessment & Plan Note (Signed)
 Wife states that is well-controlled at home.  Likely elevated in setting of pain. - Monitor at home
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
## 2024-04-24 ENCOUNTER — Other Ambulatory Visit: Payer: Self-pay

## 2024-04-24 DIAGNOSIS — R4586 Emotional lability: Secondary | ICD-10-CM

## 2024-04-24 MED ORDER — MIRTAZAPINE 7.5 MG PO TABS
7.5000 mg | ORAL_TABLET | Freq: Every day | ORAL | 0 refills | Status: DC
Start: 2024-04-24 — End: 2024-06-02

## 2024-04-25 DIAGNOSIS — I69392 Facial weakness following cerebral infarction: Secondary | ICD-10-CM | POA: Diagnosis not present

## 2024-04-25 DIAGNOSIS — J9621 Acute and chronic respiratory failure with hypoxia: Secondary | ICD-10-CM | POA: Diagnosis not present

## 2024-04-25 DIAGNOSIS — J441 Chronic obstructive pulmonary disease with (acute) exacerbation: Secondary | ICD-10-CM | POA: Diagnosis not present

## 2024-04-25 DIAGNOSIS — I69322 Dysarthria following cerebral infarction: Secondary | ICD-10-CM | POA: Diagnosis not present

## 2024-04-25 DIAGNOSIS — I130 Hypertensive heart and chronic kidney disease with heart failure and stage 1 through stage 4 chronic kidney disease, or unspecified chronic kidney disease: Secondary | ICD-10-CM | POA: Diagnosis not present

## 2024-04-25 DIAGNOSIS — J9612 Chronic respiratory failure with hypercapnia: Secondary | ICD-10-CM | POA: Diagnosis not present

## 2024-04-25 DIAGNOSIS — I69351 Hemiplegia and hemiparesis following cerebral infarction affecting right dominant side: Secondary | ICD-10-CM | POA: Diagnosis not present

## 2024-04-25 DIAGNOSIS — E1151 Type 2 diabetes mellitus with diabetic peripheral angiopathy without gangrene: Secondary | ICD-10-CM | POA: Diagnosis not present

## 2024-04-25 DIAGNOSIS — T22221D Burn of second degree of right elbow, subsequent encounter: Secondary | ICD-10-CM | POA: Diagnosis not present

## 2024-04-26 DIAGNOSIS — J441 Chronic obstructive pulmonary disease with (acute) exacerbation: Secondary | ICD-10-CM | POA: Diagnosis not present

## 2024-04-26 DIAGNOSIS — T22221D Burn of second degree of right elbow, subsequent encounter: Secondary | ICD-10-CM | POA: Diagnosis not present

## 2024-04-26 DIAGNOSIS — I69322 Dysarthria following cerebral infarction: Secondary | ICD-10-CM | POA: Diagnosis not present

## 2024-04-26 DIAGNOSIS — J9612 Chronic respiratory failure with hypercapnia: Secondary | ICD-10-CM | POA: Diagnosis not present

## 2024-04-26 DIAGNOSIS — I130 Hypertensive heart and chronic kidney disease with heart failure and stage 1 through stage 4 chronic kidney disease, or unspecified chronic kidney disease: Secondary | ICD-10-CM | POA: Diagnosis not present

## 2024-04-26 DIAGNOSIS — I69351 Hemiplegia and hemiparesis following cerebral infarction affecting right dominant side: Secondary | ICD-10-CM | POA: Diagnosis not present

## 2024-04-26 DIAGNOSIS — E1151 Type 2 diabetes mellitus with diabetic peripheral angiopathy without gangrene: Secondary | ICD-10-CM | POA: Diagnosis not present

## 2024-04-26 DIAGNOSIS — I69392 Facial weakness following cerebral infarction: Secondary | ICD-10-CM | POA: Diagnosis not present

## 2024-04-26 DIAGNOSIS — J9621 Acute and chronic respiratory failure with hypoxia: Secondary | ICD-10-CM | POA: Diagnosis not present

## 2024-04-27 DIAGNOSIS — I130 Hypertensive heart and chronic kidney disease with heart failure and stage 1 through stage 4 chronic kidney disease, or unspecified chronic kidney disease: Secondary | ICD-10-CM | POA: Diagnosis not present

## 2024-04-27 DIAGNOSIS — J9621 Acute and chronic respiratory failure with hypoxia: Secondary | ICD-10-CM | POA: Diagnosis not present

## 2024-04-27 DIAGNOSIS — I69351 Hemiplegia and hemiparesis following cerebral infarction affecting right dominant side: Secondary | ICD-10-CM | POA: Diagnosis not present

## 2024-04-27 DIAGNOSIS — I69392 Facial weakness following cerebral infarction: Secondary | ICD-10-CM | POA: Diagnosis not present

## 2024-04-27 DIAGNOSIS — J441 Chronic obstructive pulmonary disease with (acute) exacerbation: Secondary | ICD-10-CM | POA: Diagnosis not present

## 2024-04-27 DIAGNOSIS — T22221D Burn of second degree of right elbow, subsequent encounter: Secondary | ICD-10-CM | POA: Diagnosis not present

## 2024-04-27 DIAGNOSIS — E1151 Type 2 diabetes mellitus with diabetic peripheral angiopathy without gangrene: Secondary | ICD-10-CM | POA: Diagnosis not present

## 2024-04-27 DIAGNOSIS — J9612 Chronic respiratory failure with hypercapnia: Secondary | ICD-10-CM | POA: Diagnosis not present

## 2024-04-27 DIAGNOSIS — I69322 Dysarthria following cerebral infarction: Secondary | ICD-10-CM | POA: Diagnosis not present

## 2024-04-28 DIAGNOSIS — I69392 Facial weakness following cerebral infarction: Secondary | ICD-10-CM | POA: Diagnosis not present

## 2024-04-28 DIAGNOSIS — I69322 Dysarthria following cerebral infarction: Secondary | ICD-10-CM | POA: Diagnosis not present

## 2024-04-28 DIAGNOSIS — J441 Chronic obstructive pulmonary disease with (acute) exacerbation: Secondary | ICD-10-CM | POA: Diagnosis not present

## 2024-04-28 DIAGNOSIS — E1151 Type 2 diabetes mellitus with diabetic peripheral angiopathy without gangrene: Secondary | ICD-10-CM | POA: Diagnosis not present

## 2024-04-28 DIAGNOSIS — T22221D Burn of second degree of right elbow, subsequent encounter: Secondary | ICD-10-CM | POA: Diagnosis not present

## 2024-04-28 DIAGNOSIS — J9621 Acute and chronic respiratory failure with hypoxia: Secondary | ICD-10-CM | POA: Diagnosis not present

## 2024-04-28 DIAGNOSIS — I130 Hypertensive heart and chronic kidney disease with heart failure and stage 1 through stage 4 chronic kidney disease, or unspecified chronic kidney disease: Secondary | ICD-10-CM | POA: Diagnosis not present

## 2024-04-28 DIAGNOSIS — I69351 Hemiplegia and hemiparesis following cerebral infarction affecting right dominant side: Secondary | ICD-10-CM | POA: Diagnosis not present

## 2024-04-28 DIAGNOSIS — J9612 Chronic respiratory failure with hypercapnia: Secondary | ICD-10-CM | POA: Diagnosis not present

## 2024-04-30 NOTE — Progress Notes (Deleted)
 Kyle [name] ONEIDA Kyle [name] Date of Birth: April 24, 1951 Medical Record #[id number]  History of Present Illness: Kyle [name] is seen for follow up CHF.  Last seen in February 2022. He has a history of systolic CHF with EF of 20-25% in May 2012. Coronary anatomy was normal by cath. He has a bicuspid AV with moderate AS/AI. He had moderate pulmonary HTN. He was treated medically and one of my old notes stated that his LV function showed marked improvement with  Echo in 2011 showing improvement in EF to 55-60%.   Unfortunately , he was lost to follow up and was not taking medication for over 2 years. He also has a history of HTN, hyperlipidemia, and PAD s/p right fem-pop BPG.   When seen in October 2015 he had evidence of recurrent CHF with severe HTN. Echo showed an EF of 20-25% with moderate AS/AI. He was started back on medication with lasix , carvedilol , and quinapril . He did not come back for follow up as instructed. He was seen in Jan. 2016 for surgical clearance for vascular surgery.  He was seen by Dr. Eliza for claudication and was found to have severe PAD. Revascularization recommended. He underwent aortobifemoral BPG in February 2016. Was seen by me in April 2019- had missed multiple appointments. He did have an Echo in July 2018 showing normalization of LV function with EF 55%.  He is  diabetic and on insulin .  He was admitted in January 2020 with hypotension and shock. Had malaise, N/V and sweats. Treated with antibiotics. Had respiratory failure with hypoxia. Sent home with oxygen. He had Afib initially then converted to NSR. Started on Eliquis . Continued on Coreg . Had AKI with creatinine up to 2.7 that improved to 1.03. LFTs were elevated. Echo showed EF 40-45%. There was RV dysfunction with moderate pulmonary HTN. Moderate AI with mild AS. CT negative for PE but showed advanced emphysema.   He presented in June 2020 with progressive right leg pain and nonhealing wound on his foot. Angiography showed  Occluded graft to the right leg and only peroneal run off. He underwent the following surgery on 04/24/19 by Dr Oris. Right femoral and repair with interposition 8 mm Hemashield graft from prior right limb of aortofemoral bypass into into deep femoral artery, #2 right femoral to peroneal bypass with translocated non-reversed great saphenous vein harvested from the left leg. He did not follow up with vascular surgery or cardiology post surgery. Has only followed up with pulmonary for COPD with hypoxemia and pulmonary HTN.   He was admitted 2/28-3/18/21 with abdominal pain, N/V, and diarrhea. He had acute renal failure and lactic acidosis of unclear etiology. He required mechanical ventilation for one day and intermittent dialysis but was not on dialysis at discharge. His acidosis cleared. He had encephalopathy. Cranial CT and MRI were negative. CT abd/pelvis was negative. He was switched from Eliquis  to Coumadin  at DC due to AKI. Lasix  was held.  He was readmitted 3/27-3/31/21 with acute CHF. He responded to IV diuresis. He was DC on lasix  20 mg bid. DC creatinine was 2.52.   He was admitted again 4/26-4/30/21 with acute hyperglycemic hyperosmolar state.  Last creatinine 1.74. lasix  was discontinued at discharge. He was seen by me in September 2021 and was doing well at that time.   Since his last visit he had repeat Echo in June 2022 showing EF 45-50%. Moderate AI.  He was seen by Dr Eliza and had repeat angiogram in August 2022 showing patent bypass grafts.  In November 2024 he was admitted with ICH in setting of uncontrolled HTN and Eliquis . In January 2025 admitted with hypoxic respiratory failure in setting of Covid 19. Readmitted in Feb with recurrent respiratory failure with severe emphysema. Readmitted in March with same.   On follow up today he is feeling very well. Denies any SOB.  No chest pain. No claudication. Denies any ankle swelling. Weight is up 5 lbs since a year ago. Taking lasix   20 mg daily. Is compliant with meds. Not smoking. Was having some swelling/pain in his hands ? Gout. This has resolved.   Allergies as of 05/02/2024   No Known Allergies      Medication List        Accurate as of April 30, 2024  2:15 PM. If you have any questions, ask your nurse or doctor.          acetaminophen  325 MG tablet Commonly known as: TYLENOL  Take 2 tablets (650 mg total) by mouth every 4 (four) hours as needed for mild pain (pain score 1-3).   albuterol  (2.5 MG/3ML) 0.083% nebulizer solution Commonly known as: PROVENTIL  Take 3 mLs (2.5 mg total) by nebulization every 4 (four) hours as needed for wheezing or shortness of breath.   amLODipine  5 MG tablet Commonly known as: NORVASC  Take 5 mg by mouth at bedtime.   azithromycin  500 MG tablet Commonly known as: ZITHROMAX  Take 1 tablet (500 mg total) by mouth daily.   carvedilol  6.25 MG tablet Commonly known as: COREG  Take 1 tablet (6.25 mg total) by mouth 2 (two) times daily with a meal.   Centrum Men Tabs Take 1 tablet by mouth daily with breakfast.   doxycycline  100 MG capsule Commonly known as: VIBRAMYCIN  Take 1 capsule (100 mg total) by mouth 2 (two) times daily.   Droplet Pen Needles 32G X 4 MM Misc Generic drug: Insulin  Pen Needle USE AS DIRECTED WITH INSULIN    Eliquis  5 MG Tabs tablet Generic drug: apixaban  TAKE 1 TABLET TWICE DAILY What changed: when to take this   gabapentin  600 MG tablet Commonly known as: NEURONTIN  Take 1 tablet (600 mg total) by mouth at bedtime.   hydrALAZINE  25 MG tablet Commonly known as: APRESOLINE  Take 1 tablet (25 mg total) by mouth in the morning, at noon, and at bedtime.   isosorbide  mononitrate 30 MG 24 hr tablet Commonly known as: IMDUR  Take 1 tablet (30 mg total) by mouth daily.   levETIRAcetam  500 MG tablet Commonly known as: KEPPRA  Take 1 tablet (500 mg total) by mouth 2 (two) times daily.   mirtazapine  7.5 MG tablet Commonly known as: REMERON  Take 1  tablet (7.5 mg total) by mouth at bedtime.   Mucinex  600 MG 12 hr tablet Generic drug: guaiFENesin  Take 600 mg by mouth 2 (two) times daily.   NovoLOG  FlexPen 100 UNIT/ML FlexPen Generic drug: insulin  aspart Inject 0-10 Units into the skin See admin instructions. Inject 0-10 units into the skin three times a day before meals, per Sliding Scale: BGL 0-59 OR 400-1,000 = CALL MD; 60-150 = give nothing; 151-199 = 2 units; 200-249 = 4 units; 250-299 = 6 units; 300-349 = 8 units; 350-399 = 10 units   oxyCODONE  5 MG immediate release tablet Commonly known as: Oxy IR/ROXICODONE  Take 1 tablet (5 mg total) by mouth daily as needed for severe pain (pain score 7-10) (with dressing changes for burn).   OXYGEN Inhale 4 L/min into the lungs continuous.   pantoprazole  40 MG tablet Commonly known  as: PROTONIX  Take 1 tablet (40 mg total) by mouth daily.   polyethylene glycol 17 g packet Commonly known as: MIRALAX  / GLYCOLAX  Take 17 g by mouth 2 (two) times daily as needed for mild constipation or moderate constipation.   predniSONE  20 MG tablet Commonly known as: DELTASONE  Take 2 tablets (40 mg total) by mouth daily with breakfast.   rosuvastatin  10 MG tablet Commonly known as: CRESTOR  TAKE 1 TABLET AT BEDTIME   Trelegy Ellipta  200-62.5-25 MCG/ACT Aepb Generic drug: Fluticasone -Umeclidin-Vilant INHALE 1 PUFF EVERY DAY What changed: See the new instructions.   Tresiba  FlexTouch 100 UNIT/ML FlexTouch Pen Generic drug: insulin  degludec Inject 5 Units into the skin in the morning.         No Known Allergies  Past Medical History:  Diagnosis Date   Aortic insufficiency    MODERATE WITH A BICUSPID AORTIC VAVLE   Arterial occlusive disease    MULTILEVEL   Atrial fibrillation (HCC)    CHF (congestive heart failure) (HCC)    Chronic bronchitis (HCC)    CKD (chronic kidney disease) stage 3, GFR 30-59 ml/min (HCC)    COPD (chronic obstructive pulmonary disease) (HCC)    Dilated  cardiomyopathy (HCC)    WITH EJECTION FRACTION DOWN TO 20-25%--WITH CONGESTIVE HEART FAILURE   Edema    LOWER EXTREMETIES   Emphysema of lung (HCC)    Hemorrhagic stroke (HCC) 09/06/2023   right side defecit   Hypertension    ICH (intracerebral hemorrhage) (HCC) 09/10/2023   Insulin  dependent type 2 diabetes mellitus (HCC)    Normal coronary arteries 2009   Orthopnea    Peripheral arterial disease (HCC)    Pulmonary hypertension (HCC)     Past Surgical History:  Procedure Laterality Date   ABDOMINAL AORTAGRAM N/A 11/05/2014   Procedure: ABDOMINAL EZELLA;  Surgeon: Lonni GORMAN Blade, MD;  Location: [facility] CATH LAB;  Service: Cardiovascular;  Laterality: N/A;   ABDOMINAL AORTOGRAM W/LOWER EXTREMITY Bilateral 04/21/2019   Procedure: ABDOMINAL AORTOGRAM W/LOWER EXTREMITY;  Surgeon: Blade Lonni GORMAN, MD;  Location: [facility] INVASIVE CV LAB;  Service: Cardiovascular;  Laterality: Bilateral;   ABDOMINAL AORTOGRAM W/LOWER EXTREMITY Right 05/30/2021   Procedure: ABDOMINAL AORTOGRAM W/LOWER EXTREMITY;  Surgeon: Blade Lonni GORMAN, MD;  Location: [facility] INVASIVE CV LAB;  Service: Cardiovascular;  Laterality: Right;   AORTA - BILATERAL FEMORAL ARTERY BYPASS GRAFT N/A 12/18/2014   Procedure: AORTOBIFEMORAL BYPASS GRAFT;  Surgeon: Lonni GORMAN Blade, MD;  Location: [facility] OR;  Service: Vascular;  Laterality: N/A;   CARDIAC CATHETERIZATION  2009   Nl Cors, EF 20%   COLONOSCOPY     ENDARTERECTOMY FEMORAL Left 12/18/2014   Procedure: ENDARTERECTOMY FEMORAL;  Surgeon: Lonni GORMAN Blade, MD;  Location: [facility] OR;  Service: Vascular;  Laterality: Left;   FALSE ANEURYSM REPAIR Right 04/24/2019   Procedure: REPAIR OF RIGHT FEMORAL ARTERY PSEUDOANEURYSM;  Surgeon: Oris Krystal FALCON, MD;  Location: [facility] OR;  Service: Vascular;  Laterality: Right;   FEMORAL-POPLITEAL BYPASS GRAFT  02/21/2011   right   FEMORAL-TIBIAL BYPASS GRAFT Right 04/24/2019   Procedure: REDO BYPASS GRAFT RIGHT FEMORAL-TIBIAL ARTERY USING LEFT LEG  VEIN & HEMASHIELD GOLD 8mm GRAFT;  Surgeon: Oris Krystal FALCON, MD;  Location: [facility] OR;  Service: Vascular;  Laterality: Right;   IR FLUORO GUIDE CV LINE RIGHT  12/28/2019   IR REMOVAL TUN CV CATH W/O FL  01/11/2020   IR US  GUIDE VASC ACCESS RIGHT  12/28/2019   OTHER SURGICAL HISTORY     POST RIGHT FEMOROPOPLITEAL BYPASS GRAFT  PERIPHERAL VASCULAR CATHETERIZATION  11/05/2014   Procedure: LOWER EXTREMITY ANGIOGRAPHY;  Surgeon: Lonni GORMAN Blade, MD;  Location: [facility] CATH LAB;  Service: Cardiovascular;;   VEIN HARVEST Left 04/24/2019   Procedure: LEFT LEG SAPHENOUS VEIN HARVEST;  Surgeon: Oris Krystal FALCON, MD;  Location: [facility] OR;  Service: Vascular;  Laterality: Left;    Social History   Socioeconomic History   Marital status: Married    Spouse name: Not on file   Number of children: 1   Years of education: Not on file   Highest education level: Not on file  Occupational History   Occupation: retired    Associate Professor: [employer]  Tobacco Use   Smoking status: Former    Current packs/day: 0.00    Average packs/day: 1 pack/day for 40.0 years (40.0 ttl pk-yrs)    Types: Cigarettes    Start date: 10/15/1978    Quit date: 10/15/2018    Years since quitting: 5.5    Passive exposure: Never   Smokeless tobacco: Never   Tobacco comments:    quit 2010 and quit again 2019  Vaping Use   Vaping status: Never Used  Substance and Sexual Activity   Alcohol use: No    Alcohol/week: 0.0 standard drinks of alcohol   Drug use: No   Sexual activity: Not on file  Other Topics Concern   Not on file  Social History Narrative   Not on file   Social Drivers of Health   Financial Resource Strain: Low Risk  (03/05/2023)   Overall Financial Resource Strain (CARDIA)    Difficulty of Paying Living Expenses: Not hard at all  Food Insecurity: Patient Declined (04/18/2024)   Hunger Vital Sign    Worried About Running Out of Food in the Last Year: Patient declined    Ran Out of Food in the Last Year: Patient declined   Transportation Needs: Unmet Transportation Needs (04/18/2024)   PRAPARE - Administrator, Civil Service (Medical): Yes    Lack of Transportation (Non-Medical): Yes  Physical Activity: Inactive (04/18/2024)   Exercise Vital Sign    Days of Exercise per Week: 0 days    Minutes of Exercise per Session: Not on file  Stress: No Stress Concern Present (04/18/2024)   [employer] of Occupational Health - Occupational Stress Questionnaire    Feeling of Stress: Not at all  Social Connections: Unknown (04/18/2024)   Social Connection and Isolation Panel    Frequency of Communication with Friends and Family: Patient declined    Frequency of Social Gatherings with Friends and Family: Patient declined    Attends Religious Services: Patient declined    Database administrator or Organizations: No    Attends Engineer, structural: Not on file    Marital Status: Married  Recent Concern: Social Connections - Moderately Isolated (03/07/2024)   Social Connection and Isolation Panel    Frequency of Communication with Friends and Family: Once a week    Frequency of Social Gatherings with Friends and Family: Once a week    Attends Religious Services: More than 4 times per year    Active Member of [employer] or Organizations: No    Attends Banker Meetings: Never    Marital Status: Married    Family History  Problem Relation Age of Onset   Diabetes Mother    Hyperlipidemia Sister    Hypertension Sister     Review of Systems: As noted in HPI.   All other systems were reviewed  and are negative.  Physical Exam: There were no vitals taken for this visit. There were no vitals filed for this visit.     GENERAL:  Well appearing, thin BM in NAD HEENT:  PERRL, EOMI, sclera are clear. Oropharynx is clear. NECK:  No jugular venous distention, carotid upstroke brisk and symmetric, no bruits, no thyromegaly or adenopathy LUNGS:  Clear to auscultation bilaterally CHEST:   Unremarkable HEART:  RRR,  PMI not displaced or sustained,S1 and S2 within normal limits, no S3, no S4: no clicks, no rubs, gr 1/6 systolic and diastolic murmur RUSB.  ABD:  Soft, nontender. BS +, no masses or bruits. No hepatomegaly, no splenomegaly EXT:  Pedal pulses are weak.   no edema, no cyanosis no clubbing SKIN:  Warm and dry.  No rashes NEURO:  Alert and oriented x 3. Cranial nerves II through XII intact. PSYCH:  Cognitively intact   LABORATORY DATA: . Lab Results  Component Value Date   WBC 8.6 03/17/2024   HGB 10.8 (L) 03/17/2024   HCT 34.2 (L) 03/17/2024   PLT 216 03/17/2024   GLUCOSE 91 03/17/2024   CHOL 123 09/06/2023   TRIG 62 09/08/2023   HDL 40 (L) 09/06/2023   LDLCALC 61 09/06/2023   ALT 11 03/17/2024   AST 17 03/17/2024   NA 138 03/17/2024   K 4.0 03/17/2024   CL 105 03/17/2024   CREATININE 1.41 (H) 03/17/2024   BUN 21 03/17/2024   CO2 24 03/17/2024   TSH 0.382 04/08/2021   INR 1.6 (H) 03/17/2024   HGBA1C 5.8 (H) 03/07/2024    Labs dated 07/28/17: BUN 29, creatinine 1.03. Other chemistries normal. A1c 6.2%. Cholesterol 137, Triglycerides 63, HDL 43, LDL 81, TSH normal. Labs dated 12/21/17: cholesterol 123, triglycerides 64, HDL 41, LDL 69. A1c 6.3%. CMET and CBC normal. TSH normal. Dated 10/22/20: A1c 6.7%. creatinine 1.41, cholesterol 149, triglycerides 170, HDL 39, LDL 81. TSH, CBC, other chemistries normal.   Ecg today shows NSR with rate 71 with occ PVC, LVH with repolarization abnormality. I have personally reviewed and interpreted this study.   Echo: 05/06/17:  Study Conclusions   - Left ventricle: The cavity size was normal. Wall thickness was   increased in a pattern of mild LVH. Systolic function was normal.   The estimated ejection fraction was in the range of 50% to 55%.   Wall motion was normal; there were no regional wall motion   abnormalities. Doppler parameters are consistent with abnormal   left ventricular relaxation (grade 1  diastolic dysfunction). - Aortic valve: There was mild stenosis. There was mild to moderate   regurgitation. - Mitral valve: Calcified annulus. - Left atrium: The atrium was mildly dilated. - Right ventricle: The cavity size was mildly dilated. - Right atrium: The atrium was mildly dilated. - Pulmonary arteries: Systolic pressure was moderately increased.   Impressions:   - Normal LV systolic function; mild LVH; mild diastolic   dysfunction; calcified aortic valve with mild AS (mean gradient   18 mmHg) and mild to moderate AI; mild LAE; mild RAE and RVE;   trace TR with moderately elevated pulmonary pressure.  Echo 11/21/18: Study Conclusions   - Left ventricle: The cavity size was normal. Systolic function was   mildly to moderately reduced. The estimated ejection fraction was   in the range of 40% to 45%. Diffuse hypokinesis. The study is not   technically sufficient to allow evaluation of LV diastolic   function. - Ventricular septum: The contour  showed diastolic flattening. - Aortic valve: Valve mobility was restricted. There was mild   stenosis. There was moderate regurgitation. - Right ventricle: The cavity size was moderately dilated. Wall   thickness was normal. Systolic function was moderately reduced. - Right atrium: The atrium was severely dilated. - Tricuspid valve: There was moderate regurgitation. - Pulmonary arteries: Systolic pressure was moderately increased.   PA peak pressure: 53 mm Hg (S).   Impressions:   - Compared to the prior study, there has been no significant   interval change.   Echo 12/13/19: IMPRESSIONS     1. Severe concentric LVH, consider evaluation for infiltrative  cardiomyopathies such as cardiac amyloidosis.   2. Impaired GLS: -14.6%. Left ventricular ejection fraction, by  estimation, is 65 to 70%. The left ventricle has normal function. The left  ventricle has no regional wall motion abnormalities. There is severe  concentric left  ventricular hypertrophy. Left   ventricular diastolic parameters are consistent with Grade I diastolic  dysfunction (impaired relaxation).   3. Right ventricular systolic function is normal. The right ventricular  size is normal. There is moderately elevated pulmonary artery systolic  pressure. The estimated right ventricular systolic pressure is 63.2 mmHg.   4. The mitral valve is normal in structure and function. No evidence of  mitral valve regurgitation. No evidence of mitral stenosis.   5. The aortic valve is normal in structure and function. Aortic valve  regurgitation is moderate. Mild to moderate aortic valve stenosis.   6. The inferior vena cava is normal in size with greater than 50%  respiratory variability, suggesting right atrial pressure of 3 mmHg.   Echo 04/08/21: IMPRESSIONS     1. Hypokinesis of the inferolateral wall with overall mild LV  dysfunction.   2. Left ventricular ejection fraction, by estimation, is 45 to 50%. The  left ventricle has mildly decreased function. The left ventricle  demonstrates regional wall motion abnormalities (see scoring  diagram/findings for description). There is moderate  left ventricular hypertrophy. Left ventricular diastolic parameters are  consistent with Grade I diastolic dysfunction (impaired relaxation).   3. Right ventricular systolic function is normal. The right ventricular  size is normal. There is moderately elevated pulmonary artery systolic  pressure.   4. Left atrial size was mildly dilated.   5. Right atrial size was mildly dilated.   6. The mitral valve is normal in structure. Mild mitral valve  regurgitation. No evidence of mitral stenosis.   7. The aortic valve is normal in structure. Aortic valve regurgitation is  moderate. Mild to moderate aortic valve stenosis.   8. The inferior vena cava is dilated in size with >50% respiratory  variability, suggesting right atrial pressure of 8 mmHg.   Echo 09/06/23:  IMPRESSIONS     1. Left ventricular ejection fraction, by estimation, is 55 to 60%. The  left ventricle has normal function. The left ventricle has no regional  wall motion abnormalities. There is moderate concentric left ventricular  hypertrophy. Left ventricular  diastolic parameters are consistent with Grade I diastolic dysfunction  (impaired relaxation). The average left ventricular global longitudinal  strain is -15.5 %. The global longitudinal strain is abnormal.   2. Right ventricular systolic function is normal. The right ventricular  size is normal. There is normal pulmonary artery systolic pressure. The  estimated right ventricular systolic pressure is 35.6 mmHg.   3. Left atrial size was mildly dilated.   4. Right atrial size was mildly dilated.   5. The mitral  valve is normal in structure. No evidence of mitral valve  regurgitation. No evidence of mitral stenosis.   6. The aortic valve is tricuspid. There is severe calcifcation of the  aortic valve. Aortic valve regurgitation is mild. Severe aortic valve  stenosis. Aortic valve mean gradient measures 36.0 mmHg, AVA 0.95 cm^2.   7. The inferior vena cava is dilated in size with <50% respiratory  variability, suggesting right atrial pressure of 15 mmHg.    Assessment / Plan: 1. Chronic systolic CHF.  He has a history of cardiomyopathy- nonischemic. EF normalized in past when compliant with medication. Most recent Echo in 2022 showed improved EF to 45-50% with moderate LVH. Reinforced importance of compliance with medical therapy. Low sodium diet. Continue  Coreg , hydralazine . Appears to be euvolemic. On lasix  20 mg daily. Avoid ARB/ACEi due to CKD. Will update Echo now.   2. HTN- well controlled.  3. PAD s/p redo right fem-pop bypass last June 2020. S/p aortobifemoral BPG Feb. 2016. Angiography in August 2022 showed patent grafts. Dopplers in August 2023 stable.   4. Hyperlipidemia. on Crestor  . Will check labs today. Goal  LDL < 70  5. Biscuspid AV with mild AS/moderate AI. Will update Echo  6. Tobacco abuse- quit   7. DM now on insulin  per primary care. Last A1c 6.2%.   8. Advanced emphysema. On home oxygen intermittently. On Trelegy  9. Afib in setting of sepsis. In NSR now. Continue Eliquis . No clinical recurrence.   10. CKD stage 3a  Follow up in one year if Echo stable.

## 2024-05-02 ENCOUNTER — Other Ambulatory Visit: Payer: Self-pay

## 2024-05-02 ENCOUNTER — Ambulatory Visit: Admitting: Cardiology

## 2024-05-02 DIAGNOSIS — J9621 Acute and chronic respiratory failure with hypoxia: Secondary | ICD-10-CM | POA: Diagnosis not present

## 2024-05-02 DIAGNOSIS — T22221D Burn of second degree of right elbow, subsequent encounter: Secondary | ICD-10-CM | POA: Diagnosis not present

## 2024-05-02 DIAGNOSIS — R4586 Emotional lability: Secondary | ICD-10-CM

## 2024-05-02 DIAGNOSIS — I69392 Facial weakness following cerebral infarction: Secondary | ICD-10-CM | POA: Diagnosis not present

## 2024-05-02 DIAGNOSIS — J441 Chronic obstructive pulmonary disease with (acute) exacerbation: Secondary | ICD-10-CM | POA: Diagnosis not present

## 2024-05-02 DIAGNOSIS — J9612 Chronic respiratory failure with hypercapnia: Secondary | ICD-10-CM | POA: Diagnosis not present

## 2024-05-02 DIAGNOSIS — I69351 Hemiplegia and hemiparesis following cerebral infarction affecting right dominant side: Secondary | ICD-10-CM | POA: Diagnosis not present

## 2024-05-02 DIAGNOSIS — I130 Hypertensive heart and chronic kidney disease with heart failure and stage 1 through stage 4 chronic kidney disease, or unspecified chronic kidney disease: Secondary | ICD-10-CM | POA: Diagnosis not present

## 2024-05-02 DIAGNOSIS — E1151 Type 2 diabetes mellitus with diabetic peripheral angiopathy without gangrene: Secondary | ICD-10-CM | POA: Diagnosis not present

## 2024-05-02 DIAGNOSIS — I69322 Dysarthria following cerebral infarction: Secondary | ICD-10-CM | POA: Diagnosis not present

## 2024-05-04 DIAGNOSIS — J9621 Acute and chronic respiratory failure with hypoxia: Secondary | ICD-10-CM | POA: Diagnosis not present

## 2024-05-04 DIAGNOSIS — E1151 Type 2 diabetes mellitus with diabetic peripheral angiopathy without gangrene: Secondary | ICD-10-CM | POA: Diagnosis not present

## 2024-05-04 DIAGNOSIS — T22221D Burn of second degree of right elbow, subsequent encounter: Secondary | ICD-10-CM | POA: Diagnosis not present

## 2024-05-04 DIAGNOSIS — I69322 Dysarthria following cerebral infarction: Secondary | ICD-10-CM | POA: Diagnosis not present

## 2024-05-04 DIAGNOSIS — J9612 Chronic respiratory failure with hypercapnia: Secondary | ICD-10-CM | POA: Diagnosis not present

## 2024-05-04 DIAGNOSIS — I69392 Facial weakness following cerebral infarction: Secondary | ICD-10-CM | POA: Diagnosis not present

## 2024-05-04 DIAGNOSIS — J441 Chronic obstructive pulmonary disease with (acute) exacerbation: Secondary | ICD-10-CM | POA: Diagnosis not present

## 2024-05-04 DIAGNOSIS — I69351 Hemiplegia and hemiparesis following cerebral infarction affecting right dominant side: Secondary | ICD-10-CM | POA: Diagnosis not present

## 2024-05-04 DIAGNOSIS — I130 Hypertensive heart and chronic kidney disease with heart failure and stage 1 through stage 4 chronic kidney disease, or unspecified chronic kidney disease: Secondary | ICD-10-CM | POA: Diagnosis not present

## 2024-05-05 ENCOUNTER — Telehealth: Payer: Self-pay

## 2024-05-05 ENCOUNTER — Telehealth (INDEPENDENT_AMBULATORY_CARE_PROVIDER_SITE_OTHER): Admitting: Student

## 2024-05-05 DIAGNOSIS — L989 Disorder of the skin and subcutaneous tissue, unspecified: Secondary | ICD-10-CM | POA: Insufficient documentation

## 2024-05-05 DIAGNOSIS — E1151 Type 2 diabetes mellitus with diabetic peripheral angiopathy without gangrene: Secondary | ICD-10-CM | POA: Diagnosis not present

## 2024-05-05 DIAGNOSIS — T22221D Burn of second degree of right elbow, subsequent encounter: Secondary | ICD-10-CM | POA: Diagnosis not present

## 2024-05-05 DIAGNOSIS — I130 Hypertensive heart and chronic kidney disease with heart failure and stage 1 through stage 4 chronic kidney disease, or unspecified chronic kidney disease: Secondary | ICD-10-CM | POA: Diagnosis not present

## 2024-05-05 DIAGNOSIS — I69351 Hemiplegia and hemiparesis following cerebral infarction affecting right dominant side: Secondary | ICD-10-CM | POA: Diagnosis not present

## 2024-05-05 DIAGNOSIS — J9621 Acute and chronic respiratory failure with hypoxia: Secondary | ICD-10-CM | POA: Diagnosis not present

## 2024-05-05 DIAGNOSIS — I69392 Facial weakness following cerebral infarction: Secondary | ICD-10-CM | POA: Diagnosis not present

## 2024-05-05 DIAGNOSIS — J441 Chronic obstructive pulmonary disease with (acute) exacerbation: Secondary | ICD-10-CM | POA: Diagnosis not present

## 2024-05-05 DIAGNOSIS — I69322 Dysarthria following cerebral infarction: Secondary | ICD-10-CM | POA: Diagnosis not present

## 2024-05-05 DIAGNOSIS — J9612 Chronic respiratory failure with hypercapnia: Secondary | ICD-10-CM | POA: Diagnosis not present

## 2024-05-05 NOTE — Progress Notes (Signed)
 [facility] Telemedicine Visit  Patient consented to have virtual visit and was identified by name and date of birth. Method of visit: Video  Encounter participants: Patient: Kyle [name] - located at Home Provider: Penne Rhein - located at [facility]  Chief Complaint: Concern for Shingles  HPI:  Rash started Tuesday, wife was cleaning face, now its red and inflamed and has pus. No fevers body aches or chills. It's not spreading and located on side of his nose, under his eye. No itching, burning or pain.   ROS: per HPI  Pertinent PMHx:   Exam:  There were no vitals taken for this visit.  Respiratory: Normal WOB Skin: Lesion on face, slightly bigger than a quarter, could not appreciate texture, or color based on video quality  Assessment/Plan:  Facial skin lesion Patient comes coming in for video visit, with complaint of lesion on right side of face under eye and ear nose, and has been there since Tuesday.  Wife appreciates she had been washing his face, was a little rough and area got a little red and roll, then started bumping up and draining pus.  Wife denies any systemic symptoms, reports he is in normal state of health otherwise.  Provider unable to determine what kind of lesion was present on patient's face, secondary to the video quality.  Given risk for shingles ocular infection, have recommended patient be seen at urgent care or emergency department.  Secondary to transportation issues with the patient not being able to walk wife notes they will likely call EMS. - Follow-up with EMS/ED    Time spent during visit with patient: 10 minutes

## 2024-05-05 NOTE — Telephone Encounter (Signed)
 Received call from patient's home health nurse regarding concern for possible shingles.   She reports that she had home visit today and patient has a painful rash on face.   She has concerns that this could be shingles.   Patient is in motorized wheelchair and has no possible way to come into the office this afternoon.   They really want to be able to have visit with provider virtually. They have had several mychart video visits in the past. Patient has working camera phone to be able to visualize areas.   We went ahead and scheduled patient for virtual visit this afternoon with Dr. Jennelle.   Chiquita JAYSON English, RN

## 2024-05-05 NOTE — Assessment & Plan Note (Signed)
 Patient comes coming in for video visit, with complaint of lesion on right side of face under eye and ear nose, and has been there since Tuesday.  Wife appreciates she had been washing his face, was a little rough and area got a little red and roll, then started bumping up and draining pus.  Wife denies any systemic symptoms, reports he is in normal state of health otherwise.  Provider unable to determine what kind of lesion was present on patient's face, secondary to the video quality.  Given risk for shingles ocular infection, have recommended patient be seen at urgent care or emergency department.  Secondary to transportation issues with the patient not being able to walk wife notes they will likely call EMS. - Follow-up with EMS/ED

## 2024-05-05 NOTE — Patient Instructions (Signed)
 It was great to see you! Thank you for allowing me to participate in your care!  I recommend that you always bring your medications to each appointment as this makes it easy to ensure we are on the correct medications and helps us  not miss when refills are needed.  Our plans for today:  - Concern for Shingles -   We are checking some labs today, I will call you if they are abnormal will send you a MyChart message or a letter if they are normal.  If you do not hear about your labs in the next 2 weeks please let us  know.  Take care and seek immediate care sooner if you develop any concerns.   Dr. Penne Rhein, MD [facility]

## 2024-05-08 DIAGNOSIS — E1151 Type 2 diabetes mellitus with diabetic peripheral angiopathy without gangrene: Secondary | ICD-10-CM | POA: Diagnosis not present

## 2024-05-08 DIAGNOSIS — T22221D Burn of second degree of right elbow, subsequent encounter: Secondary | ICD-10-CM | POA: Diagnosis not present

## 2024-05-08 DIAGNOSIS — J441 Chronic obstructive pulmonary disease with (acute) exacerbation: Secondary | ICD-10-CM | POA: Diagnosis not present

## 2024-05-08 DIAGNOSIS — I69322 Dysarthria following cerebral infarction: Secondary | ICD-10-CM | POA: Diagnosis not present

## 2024-05-08 DIAGNOSIS — I69392 Facial weakness following cerebral infarction: Secondary | ICD-10-CM | POA: Diagnosis not present

## 2024-05-08 DIAGNOSIS — J9621 Acute and chronic respiratory failure with hypoxia: Secondary | ICD-10-CM | POA: Diagnosis not present

## 2024-05-08 DIAGNOSIS — I130 Hypertensive heart and chronic kidney disease with heart failure and stage 1 through stage 4 chronic kidney disease, or unspecified chronic kidney disease: Secondary | ICD-10-CM | POA: Diagnosis not present

## 2024-05-08 DIAGNOSIS — I69351 Hemiplegia and hemiparesis following cerebral infarction affecting right dominant side: Secondary | ICD-10-CM | POA: Diagnosis not present

## 2024-05-08 DIAGNOSIS — J9612 Chronic respiratory failure with hypercapnia: Secondary | ICD-10-CM | POA: Diagnosis not present

## 2024-05-09 DIAGNOSIS — J441 Chronic obstructive pulmonary disease with (acute) exacerbation: Secondary | ICD-10-CM | POA: Diagnosis not present

## 2024-05-09 DIAGNOSIS — J9612 Chronic respiratory failure with hypercapnia: Secondary | ICD-10-CM | POA: Diagnosis not present

## 2024-05-09 DIAGNOSIS — E1151 Type 2 diabetes mellitus with diabetic peripheral angiopathy without gangrene: Secondary | ICD-10-CM | POA: Diagnosis not present

## 2024-05-09 DIAGNOSIS — I130 Hypertensive heart and chronic kidney disease with heart failure and stage 1 through stage 4 chronic kidney disease, or unspecified chronic kidney disease: Secondary | ICD-10-CM | POA: Diagnosis not present

## 2024-05-09 DIAGNOSIS — J9621 Acute and chronic respiratory failure with hypoxia: Secondary | ICD-10-CM | POA: Diagnosis not present

## 2024-05-09 DIAGNOSIS — I69322 Dysarthria following cerebral infarction: Secondary | ICD-10-CM | POA: Diagnosis not present

## 2024-05-09 DIAGNOSIS — I69351 Hemiplegia and hemiparesis following cerebral infarction affecting right dominant side: Secondary | ICD-10-CM | POA: Diagnosis not present

## 2024-05-09 DIAGNOSIS — T22221D Burn of second degree of right elbow, subsequent encounter: Secondary | ICD-10-CM | POA: Diagnosis not present

## 2024-05-09 DIAGNOSIS — I69392 Facial weakness following cerebral infarction: Secondary | ICD-10-CM | POA: Diagnosis not present

## 2024-05-11 DIAGNOSIS — T22221D Burn of second degree of right elbow, subsequent encounter: Secondary | ICD-10-CM | POA: Diagnosis not present

## 2024-05-11 DIAGNOSIS — E1151 Type 2 diabetes mellitus with diabetic peripheral angiopathy without gangrene: Secondary | ICD-10-CM | POA: Diagnosis not present

## 2024-05-11 DIAGNOSIS — I130 Hypertensive heart and chronic kidney disease with heart failure and stage 1 through stage 4 chronic kidney disease, or unspecified chronic kidney disease: Secondary | ICD-10-CM | POA: Diagnosis not present

## 2024-05-11 DIAGNOSIS — I69322 Dysarthria following cerebral infarction: Secondary | ICD-10-CM | POA: Diagnosis not present

## 2024-05-11 DIAGNOSIS — J9612 Chronic respiratory failure with hypercapnia: Secondary | ICD-10-CM | POA: Diagnosis not present

## 2024-05-11 DIAGNOSIS — J9621 Acute and chronic respiratory failure with hypoxia: Secondary | ICD-10-CM | POA: Diagnosis not present

## 2024-05-11 DIAGNOSIS — J441 Chronic obstructive pulmonary disease with (acute) exacerbation: Secondary | ICD-10-CM | POA: Diagnosis not present

## 2024-05-11 DIAGNOSIS — I69351 Hemiplegia and hemiparesis following cerebral infarction affecting right dominant side: Secondary | ICD-10-CM | POA: Diagnosis not present

## 2024-05-11 DIAGNOSIS — I69392 Facial weakness following cerebral infarction: Secondary | ICD-10-CM | POA: Diagnosis not present

## 2024-05-12 DIAGNOSIS — J9612 Chronic respiratory failure with hypercapnia: Secondary | ICD-10-CM | POA: Diagnosis not present

## 2024-05-12 DIAGNOSIS — J441 Chronic obstructive pulmonary disease with (acute) exacerbation: Secondary | ICD-10-CM | POA: Diagnosis not present

## 2024-05-12 DIAGNOSIS — T22221D Burn of second degree of right elbow, subsequent encounter: Secondary | ICD-10-CM | POA: Diagnosis not present

## 2024-05-12 DIAGNOSIS — E1151 Type 2 diabetes mellitus with diabetic peripheral angiopathy without gangrene: Secondary | ICD-10-CM | POA: Diagnosis not present

## 2024-05-12 DIAGNOSIS — J9621 Acute and chronic respiratory failure with hypoxia: Secondary | ICD-10-CM | POA: Diagnosis not present

## 2024-05-12 DIAGNOSIS — I69322 Dysarthria following cerebral infarction: Secondary | ICD-10-CM | POA: Diagnosis not present

## 2024-05-12 DIAGNOSIS — I69392 Facial weakness following cerebral infarction: Secondary | ICD-10-CM | POA: Diagnosis not present

## 2024-05-12 DIAGNOSIS — I69351 Hemiplegia and hemiparesis following cerebral infarction affecting right dominant side: Secondary | ICD-10-CM | POA: Diagnosis not present

## 2024-05-12 DIAGNOSIS — I130 Hypertensive heart and chronic kidney disease with heart failure and stage 1 through stage 4 chronic kidney disease, or unspecified chronic kidney disease: Secondary | ICD-10-CM | POA: Diagnosis not present

## 2024-05-15 DIAGNOSIS — I69392 Facial weakness following cerebral infarction: Secondary | ICD-10-CM | POA: Diagnosis not present

## 2024-05-15 DIAGNOSIS — I130 Hypertensive heart and chronic kidney disease with heart failure and stage 1 through stage 4 chronic kidney disease, or unspecified chronic kidney disease: Secondary | ICD-10-CM | POA: Diagnosis not present

## 2024-05-15 DIAGNOSIS — T22221D Burn of second degree of right elbow, subsequent encounter: Secondary | ICD-10-CM | POA: Diagnosis not present

## 2024-05-15 DIAGNOSIS — J9612 Chronic respiratory failure with hypercapnia: Secondary | ICD-10-CM | POA: Diagnosis not present

## 2024-05-15 DIAGNOSIS — I69351 Hemiplegia and hemiparesis following cerebral infarction affecting right dominant side: Secondary | ICD-10-CM | POA: Diagnosis not present

## 2024-05-15 DIAGNOSIS — E1151 Type 2 diabetes mellitus with diabetic peripheral angiopathy without gangrene: Secondary | ICD-10-CM | POA: Diagnosis not present

## 2024-05-15 DIAGNOSIS — J9621 Acute and chronic respiratory failure with hypoxia: Secondary | ICD-10-CM | POA: Diagnosis not present

## 2024-05-15 DIAGNOSIS — J441 Chronic obstructive pulmonary disease with (acute) exacerbation: Secondary | ICD-10-CM | POA: Diagnosis not present

## 2024-05-15 DIAGNOSIS — I69322 Dysarthria following cerebral infarction: Secondary | ICD-10-CM | POA: Diagnosis not present

## 2024-05-16 DIAGNOSIS — J441 Chronic obstructive pulmonary disease with (acute) exacerbation: Secondary | ICD-10-CM | POA: Diagnosis not present

## 2024-05-16 DIAGNOSIS — I69351 Hemiplegia and hemiparesis following cerebral infarction affecting right dominant side: Secondary | ICD-10-CM | POA: Diagnosis not present

## 2024-05-16 DIAGNOSIS — J9621 Acute and chronic respiratory failure with hypoxia: Secondary | ICD-10-CM | POA: Diagnosis not present

## 2024-05-16 DIAGNOSIS — E1151 Type 2 diabetes mellitus with diabetic peripheral angiopathy without gangrene: Secondary | ICD-10-CM | POA: Diagnosis not present

## 2024-05-16 DIAGNOSIS — J9612 Chronic respiratory failure with hypercapnia: Secondary | ICD-10-CM | POA: Diagnosis not present

## 2024-05-16 DIAGNOSIS — I130 Hypertensive heart and chronic kidney disease with heart failure and stage 1 through stage 4 chronic kidney disease, or unspecified chronic kidney disease: Secondary | ICD-10-CM | POA: Diagnosis not present

## 2024-05-16 DIAGNOSIS — T22221D Burn of second degree of right elbow, subsequent encounter: Secondary | ICD-10-CM | POA: Diagnosis not present

## 2024-05-16 DIAGNOSIS — I69322 Dysarthria following cerebral infarction: Secondary | ICD-10-CM | POA: Diagnosis not present

## 2024-05-16 DIAGNOSIS — I69392 Facial weakness following cerebral infarction: Secondary | ICD-10-CM | POA: Diagnosis not present

## 2024-05-18 ENCOUNTER — Encounter

## 2024-05-18 DIAGNOSIS — J441 Chronic obstructive pulmonary disease with (acute) exacerbation: Secondary | ICD-10-CM | POA: Diagnosis not present

## 2024-05-18 DIAGNOSIS — J9612 Chronic respiratory failure with hypercapnia: Secondary | ICD-10-CM | POA: Diagnosis not present

## 2024-05-18 DIAGNOSIS — I69392 Facial weakness following cerebral infarction: Secondary | ICD-10-CM | POA: Diagnosis not present

## 2024-05-18 DIAGNOSIS — J9621 Acute and chronic respiratory failure with hypoxia: Secondary | ICD-10-CM | POA: Diagnosis not present

## 2024-05-18 DIAGNOSIS — I69351 Hemiplegia and hemiparesis following cerebral infarction affecting right dominant side: Secondary | ICD-10-CM | POA: Diagnosis not present

## 2024-05-18 DIAGNOSIS — E1151 Type 2 diabetes mellitus with diabetic peripheral angiopathy without gangrene: Secondary | ICD-10-CM | POA: Diagnosis not present

## 2024-05-18 DIAGNOSIS — T22221D Burn of second degree of right elbow, subsequent encounter: Secondary | ICD-10-CM | POA: Diagnosis not present

## 2024-05-18 DIAGNOSIS — I69322 Dysarthria following cerebral infarction: Secondary | ICD-10-CM | POA: Diagnosis not present

## 2024-05-18 DIAGNOSIS — I130 Hypertensive heart and chronic kidney disease with heart failure and stage 1 through stage 4 chronic kidney disease, or unspecified chronic kidney disease: Secondary | ICD-10-CM | POA: Diagnosis not present

## 2024-05-19 DIAGNOSIS — J9621 Acute and chronic respiratory failure with hypoxia: Secondary | ICD-10-CM | POA: Diagnosis not present

## 2024-05-19 DIAGNOSIS — J441 Chronic obstructive pulmonary disease with (acute) exacerbation: Secondary | ICD-10-CM | POA: Diagnosis not present

## 2024-05-19 DIAGNOSIS — T22221D Burn of second degree of right elbow, subsequent encounter: Secondary | ICD-10-CM | POA: Diagnosis not present

## 2024-05-19 DIAGNOSIS — J9612 Chronic respiratory failure with hypercapnia: Secondary | ICD-10-CM | POA: Diagnosis not present

## 2024-05-19 DIAGNOSIS — I69322 Dysarthria following cerebral infarction: Secondary | ICD-10-CM | POA: Diagnosis not present

## 2024-05-19 DIAGNOSIS — I130 Hypertensive heart and chronic kidney disease with heart failure and stage 1 through stage 4 chronic kidney disease, or unspecified chronic kidney disease: Secondary | ICD-10-CM | POA: Diagnosis not present

## 2024-05-19 DIAGNOSIS — E1151 Type 2 diabetes mellitus with diabetic peripheral angiopathy without gangrene: Secondary | ICD-10-CM | POA: Diagnosis not present

## 2024-05-19 DIAGNOSIS — I69392 Facial weakness following cerebral infarction: Secondary | ICD-10-CM | POA: Diagnosis not present

## 2024-05-19 DIAGNOSIS — I69351 Hemiplegia and hemiparesis following cerebral infarction affecting right dominant side: Secondary | ICD-10-CM | POA: Diagnosis not present

## 2024-05-22 DIAGNOSIS — E1151 Type 2 diabetes mellitus with diabetic peripheral angiopathy without gangrene: Secondary | ICD-10-CM | POA: Diagnosis not present

## 2024-05-22 DIAGNOSIS — I69322 Dysarthria following cerebral infarction: Secondary | ICD-10-CM | POA: Diagnosis not present

## 2024-05-22 DIAGNOSIS — I130 Hypertensive heart and chronic kidney disease with heart failure and stage 1 through stage 4 chronic kidney disease, or unspecified chronic kidney disease: Secondary | ICD-10-CM | POA: Diagnosis not present

## 2024-05-22 DIAGNOSIS — T22221D Burn of second degree of right elbow, subsequent encounter: Secondary | ICD-10-CM | POA: Diagnosis not present

## 2024-05-22 DIAGNOSIS — J9621 Acute and chronic respiratory failure with hypoxia: Secondary | ICD-10-CM | POA: Diagnosis not present

## 2024-05-22 DIAGNOSIS — I69392 Facial weakness following cerebral infarction: Secondary | ICD-10-CM | POA: Diagnosis not present

## 2024-05-22 DIAGNOSIS — J441 Chronic obstructive pulmonary disease with (acute) exacerbation: Secondary | ICD-10-CM | POA: Diagnosis not present

## 2024-05-22 DIAGNOSIS — J9612 Chronic respiratory failure with hypercapnia: Secondary | ICD-10-CM | POA: Diagnosis not present

## 2024-05-22 DIAGNOSIS — I69351 Hemiplegia and hemiparesis following cerebral infarction affecting right dominant side: Secondary | ICD-10-CM | POA: Diagnosis not present

## 2024-05-23 DIAGNOSIS — J9621 Acute and chronic respiratory failure with hypoxia: Secondary | ICD-10-CM | POA: Diagnosis not present

## 2024-05-23 DIAGNOSIS — I69351 Hemiplegia and hemiparesis following cerebral infarction affecting right dominant side: Secondary | ICD-10-CM | POA: Diagnosis not present

## 2024-05-23 DIAGNOSIS — J9612 Chronic respiratory failure with hypercapnia: Secondary | ICD-10-CM | POA: Diagnosis not present

## 2024-05-23 DIAGNOSIS — T22221D Burn of second degree of right elbow, subsequent encounter: Secondary | ICD-10-CM | POA: Diagnosis not present

## 2024-05-23 DIAGNOSIS — E1151 Type 2 diabetes mellitus with diabetic peripheral angiopathy without gangrene: Secondary | ICD-10-CM | POA: Diagnosis not present

## 2024-05-23 DIAGNOSIS — I69392 Facial weakness following cerebral infarction: Secondary | ICD-10-CM | POA: Diagnosis not present

## 2024-05-23 DIAGNOSIS — J441 Chronic obstructive pulmonary disease with (acute) exacerbation: Secondary | ICD-10-CM | POA: Diagnosis not present

## 2024-05-23 DIAGNOSIS — I130 Hypertensive heart and chronic kidney disease with heart failure and stage 1 through stage 4 chronic kidney disease, or unspecified chronic kidney disease: Secondary | ICD-10-CM | POA: Diagnosis not present

## 2024-05-23 DIAGNOSIS — I69322 Dysarthria following cerebral infarction: Secondary | ICD-10-CM | POA: Diagnosis not present

## 2024-05-24 ENCOUNTER — Ambulatory Visit: Payer: Medicare HMO | Admitting: Adult Health

## 2024-05-26 DIAGNOSIS — J9612 Chronic respiratory failure with hypercapnia: Secondary | ICD-10-CM | POA: Diagnosis not present

## 2024-05-26 DIAGNOSIS — I69351 Hemiplegia and hemiparesis following cerebral infarction affecting right dominant side: Secondary | ICD-10-CM | POA: Diagnosis not present

## 2024-05-26 DIAGNOSIS — I130 Hypertensive heart and chronic kidney disease with heart failure and stage 1 through stage 4 chronic kidney disease, or unspecified chronic kidney disease: Secondary | ICD-10-CM | POA: Diagnosis not present

## 2024-05-26 DIAGNOSIS — J441 Chronic obstructive pulmonary disease with (acute) exacerbation: Secondary | ICD-10-CM | POA: Diagnosis not present

## 2024-05-26 DIAGNOSIS — J9621 Acute and chronic respiratory failure with hypoxia: Secondary | ICD-10-CM | POA: Diagnosis not present

## 2024-05-26 DIAGNOSIS — E1151 Type 2 diabetes mellitus with diabetic peripheral angiopathy without gangrene: Secondary | ICD-10-CM | POA: Diagnosis not present

## 2024-05-26 DIAGNOSIS — I69322 Dysarthria following cerebral infarction: Secondary | ICD-10-CM | POA: Diagnosis not present

## 2024-05-26 DIAGNOSIS — I69392 Facial weakness following cerebral infarction: Secondary | ICD-10-CM | POA: Diagnosis not present

## 2024-05-26 DIAGNOSIS — T22221D Burn of second degree of right elbow, subsequent encounter: Secondary | ICD-10-CM | POA: Diagnosis not present

## 2024-05-27 DIAGNOSIS — I69392 Facial weakness following cerebral infarction: Secondary | ICD-10-CM | POA: Diagnosis not present

## 2024-05-27 DIAGNOSIS — I69322 Dysarthria following cerebral infarction: Secondary | ICD-10-CM | POA: Diagnosis not present

## 2024-05-27 DIAGNOSIS — J9612 Chronic respiratory failure with hypercapnia: Secondary | ICD-10-CM | POA: Diagnosis not present

## 2024-05-27 DIAGNOSIS — I69351 Hemiplegia and hemiparesis following cerebral infarction affecting right dominant side: Secondary | ICD-10-CM | POA: Diagnosis not present

## 2024-05-27 DIAGNOSIS — T22221D Burn of second degree of right elbow, subsequent encounter: Secondary | ICD-10-CM | POA: Diagnosis not present

## 2024-05-27 DIAGNOSIS — E1151 Type 2 diabetes mellitus with diabetic peripheral angiopathy without gangrene: Secondary | ICD-10-CM | POA: Diagnosis not present

## 2024-05-27 DIAGNOSIS — J441 Chronic obstructive pulmonary disease with (acute) exacerbation: Secondary | ICD-10-CM | POA: Diagnosis not present

## 2024-05-27 DIAGNOSIS — I130 Hypertensive heart and chronic kidney disease with heart failure and stage 1 through stage 4 chronic kidney disease, or unspecified chronic kidney disease: Secondary | ICD-10-CM | POA: Diagnosis not present

## 2024-05-27 DIAGNOSIS — J9621 Acute and chronic respiratory failure with hypoxia: Secondary | ICD-10-CM | POA: Diagnosis not present

## 2024-05-30 DIAGNOSIS — I130 Hypertensive heart and chronic kidney disease with heart failure and stage 1 through stage 4 chronic kidney disease, or unspecified chronic kidney disease: Secondary | ICD-10-CM | POA: Diagnosis not present

## 2024-05-30 DIAGNOSIS — I69392 Facial weakness following cerebral infarction: Secondary | ICD-10-CM | POA: Diagnosis not present

## 2024-05-30 DIAGNOSIS — J9621 Acute and chronic respiratory failure with hypoxia: Secondary | ICD-10-CM | POA: Diagnosis not present

## 2024-05-30 DIAGNOSIS — I69322 Dysarthria following cerebral infarction: Secondary | ICD-10-CM | POA: Diagnosis not present

## 2024-05-30 DIAGNOSIS — E1151 Type 2 diabetes mellitus with diabetic peripheral angiopathy without gangrene: Secondary | ICD-10-CM | POA: Diagnosis not present

## 2024-05-30 DIAGNOSIS — T22221D Burn of second degree of right elbow, subsequent encounter: Secondary | ICD-10-CM | POA: Diagnosis not present

## 2024-05-30 DIAGNOSIS — J441 Chronic obstructive pulmonary disease with (acute) exacerbation: Secondary | ICD-10-CM | POA: Diagnosis not present

## 2024-05-30 DIAGNOSIS — J9612 Chronic respiratory failure with hypercapnia: Secondary | ICD-10-CM | POA: Diagnosis not present

## 2024-05-30 DIAGNOSIS — I69351 Hemiplegia and hemiparesis following cerebral infarction affecting right dominant side: Secondary | ICD-10-CM | POA: Diagnosis not present

## 2024-06-01 DIAGNOSIS — I69322 Dysarthria following cerebral infarction: Secondary | ICD-10-CM | POA: Diagnosis not present

## 2024-06-01 DIAGNOSIS — I69351 Hemiplegia and hemiparesis following cerebral infarction affecting right dominant side: Secondary | ICD-10-CM | POA: Diagnosis not present

## 2024-06-01 DIAGNOSIS — I69392 Facial weakness following cerebral infarction: Secondary | ICD-10-CM | POA: Diagnosis not present

## 2024-06-01 DIAGNOSIS — J441 Chronic obstructive pulmonary disease with (acute) exacerbation: Secondary | ICD-10-CM | POA: Diagnosis not present

## 2024-06-01 DIAGNOSIS — J9612 Chronic respiratory failure with hypercapnia: Secondary | ICD-10-CM | POA: Diagnosis not present

## 2024-06-01 DIAGNOSIS — E1151 Type 2 diabetes mellitus with diabetic peripheral angiopathy without gangrene: Secondary | ICD-10-CM | POA: Diagnosis not present

## 2024-06-01 DIAGNOSIS — J9621 Acute and chronic respiratory failure with hypoxia: Secondary | ICD-10-CM | POA: Diagnosis not present

## 2024-06-01 DIAGNOSIS — I130 Hypertensive heart and chronic kidney disease with heart failure and stage 1 through stage 4 chronic kidney disease, or unspecified chronic kidney disease: Secondary | ICD-10-CM | POA: Diagnosis not present

## 2024-06-01 DIAGNOSIS — T22221D Burn of second degree of right elbow, subsequent encounter: Secondary | ICD-10-CM | POA: Diagnosis not present

## 2024-06-02 ENCOUNTER — Other Ambulatory Visit: Payer: Self-pay

## 2024-06-02 DIAGNOSIS — J441 Chronic obstructive pulmonary disease with (acute) exacerbation: Secondary | ICD-10-CM | POA: Diagnosis not present

## 2024-06-02 DIAGNOSIS — J9612 Chronic respiratory failure with hypercapnia: Secondary | ICD-10-CM | POA: Diagnosis not present

## 2024-06-02 DIAGNOSIS — I130 Hypertensive heart and chronic kidney disease with heart failure and stage 1 through stage 4 chronic kidney disease, or unspecified chronic kidney disease: Secondary | ICD-10-CM | POA: Diagnosis not present

## 2024-06-02 DIAGNOSIS — Z9981 Dependence on supplemental oxygen: Secondary | ICD-10-CM | POA: Diagnosis not present

## 2024-06-02 DIAGNOSIS — L89153 Pressure ulcer of sacral region, stage 3: Secondary | ICD-10-CM | POA: Diagnosis not present

## 2024-06-02 DIAGNOSIS — I679 Cerebrovascular disease, unspecified: Secondary | ICD-10-CM | POA: Diagnosis not present

## 2024-06-02 DIAGNOSIS — J9621 Acute and chronic respiratory failure with hypoxia: Secondary | ICD-10-CM | POA: Diagnosis not present

## 2024-06-02 DIAGNOSIS — J449 Chronic obstructive pulmonary disease, unspecified: Secondary | ICD-10-CM | POA: Diagnosis not present

## 2024-06-02 DIAGNOSIS — I69322 Dysarthria following cerebral infarction: Secondary | ICD-10-CM | POA: Diagnosis not present

## 2024-06-02 DIAGNOSIS — G8191 Hemiplegia, unspecified affecting right dominant side: Secondary | ICD-10-CM | POA: Diagnosis not present

## 2024-06-02 DIAGNOSIS — T22221D Burn of second degree of right elbow, subsequent encounter: Secondary | ICD-10-CM | POA: Diagnosis not present

## 2024-06-02 DIAGNOSIS — E1151 Type 2 diabetes mellitus with diabetic peripheral angiopathy without gangrene: Secondary | ICD-10-CM | POA: Diagnosis not present

## 2024-06-02 DIAGNOSIS — I69392 Facial weakness following cerebral infarction: Secondary | ICD-10-CM | POA: Diagnosis not present

## 2024-06-02 DIAGNOSIS — L89314 Pressure ulcer of right buttock, stage 4: Secondary | ICD-10-CM | POA: Diagnosis not present

## 2024-06-02 DIAGNOSIS — I69351 Hemiplegia and hemiparesis following cerebral infarction affecting right dominant side: Secondary | ICD-10-CM | POA: Diagnosis not present

## 2024-06-02 DIAGNOSIS — R4586 Emotional lability: Secondary | ICD-10-CM

## 2024-06-03 MED ORDER — MIRTAZAPINE 7.5 MG PO TABS: 7.5000 mg | ORAL_TABLET | Freq: Every day | ORAL | 3 refills | Status: AC

## 2024-06-06 DIAGNOSIS — I69322 Dysarthria following cerebral infarction: Secondary | ICD-10-CM | POA: Diagnosis not present

## 2024-06-06 DIAGNOSIS — T22221D Burn of second degree of right elbow, subsequent encounter: Secondary | ICD-10-CM | POA: Diagnosis not present

## 2024-06-06 DIAGNOSIS — I69351 Hemiplegia and hemiparesis following cerebral infarction affecting right dominant side: Secondary | ICD-10-CM | POA: Diagnosis not present

## 2024-06-06 DIAGNOSIS — J9612 Chronic respiratory failure with hypercapnia: Secondary | ICD-10-CM | POA: Diagnosis not present

## 2024-06-06 DIAGNOSIS — E1151 Type 2 diabetes mellitus with diabetic peripheral angiopathy without gangrene: Secondary | ICD-10-CM | POA: Diagnosis not present

## 2024-06-06 DIAGNOSIS — I69392 Facial weakness following cerebral infarction: Secondary | ICD-10-CM | POA: Diagnosis not present

## 2024-06-06 DIAGNOSIS — J441 Chronic obstructive pulmonary disease with (acute) exacerbation: Secondary | ICD-10-CM | POA: Diagnosis not present

## 2024-06-06 DIAGNOSIS — I130 Hypertensive heart and chronic kidney disease with heart failure and stage 1 through stage 4 chronic kidney disease, or unspecified chronic kidney disease: Secondary | ICD-10-CM | POA: Diagnosis not present

## 2024-06-06 DIAGNOSIS — J9621 Acute and chronic respiratory failure with hypoxia: Secondary | ICD-10-CM | POA: Diagnosis not present

## 2024-06-08 ENCOUNTER — Other Ambulatory Visit: Payer: Self-pay

## 2024-06-08 ENCOUNTER — Other Ambulatory Visit: Payer: Self-pay | Admitting: Cardiology

## 2024-06-08 DIAGNOSIS — T22221D Burn of second degree of right elbow, subsequent encounter: Secondary | ICD-10-CM | POA: Diagnosis not present

## 2024-06-08 DIAGNOSIS — I69351 Hemiplegia and hemiparesis following cerebral infarction affecting right dominant side: Secondary | ICD-10-CM | POA: Diagnosis not present

## 2024-06-08 DIAGNOSIS — I10 Essential (primary) hypertension: Secondary | ICD-10-CM

## 2024-06-08 DIAGNOSIS — I69322 Dysarthria following cerebral infarction: Secondary | ICD-10-CM | POA: Diagnosis not present

## 2024-06-08 DIAGNOSIS — R4586 Emotional lability: Secondary | ICD-10-CM

## 2024-06-08 DIAGNOSIS — J9612 Chronic respiratory failure with hypercapnia: Secondary | ICD-10-CM | POA: Diagnosis not present

## 2024-06-08 DIAGNOSIS — E1151 Type 2 diabetes mellitus with diabetic peripheral angiopathy without gangrene: Secondary | ICD-10-CM | POA: Diagnosis not present

## 2024-06-08 DIAGNOSIS — J441 Chronic obstructive pulmonary disease with (acute) exacerbation: Secondary | ICD-10-CM | POA: Diagnosis not present

## 2024-06-08 DIAGNOSIS — I130 Hypertensive heart and chronic kidney disease with heart failure and stage 1 through stage 4 chronic kidney disease, or unspecified chronic kidney disease: Secondary | ICD-10-CM | POA: Diagnosis not present

## 2024-06-08 DIAGNOSIS — J9621 Acute and chronic respiratory failure with hypoxia: Secondary | ICD-10-CM | POA: Diagnosis not present

## 2024-06-08 DIAGNOSIS — I69392 Facial weakness following cerebral infarction: Secondary | ICD-10-CM | POA: Diagnosis not present

## 2024-06-09 DIAGNOSIS — I69351 Hemiplegia and hemiparesis following cerebral infarction affecting right dominant side: Secondary | ICD-10-CM | POA: Diagnosis not present

## 2024-06-09 DIAGNOSIS — I69392 Facial weakness following cerebral infarction: Secondary | ICD-10-CM | POA: Diagnosis not present

## 2024-06-09 DIAGNOSIS — T22221D Burn of second degree of right elbow, subsequent encounter: Secondary | ICD-10-CM | POA: Diagnosis not present

## 2024-06-09 DIAGNOSIS — J9621 Acute and chronic respiratory failure with hypoxia: Secondary | ICD-10-CM | POA: Diagnosis not present

## 2024-06-09 DIAGNOSIS — E1151 Type 2 diabetes mellitus with diabetic peripheral angiopathy without gangrene: Secondary | ICD-10-CM | POA: Diagnosis not present

## 2024-06-09 DIAGNOSIS — I130 Hypertensive heart and chronic kidney disease with heart failure and stage 1 through stage 4 chronic kidney disease, or unspecified chronic kidney disease: Secondary | ICD-10-CM | POA: Diagnosis not present

## 2024-06-09 DIAGNOSIS — J9612 Chronic respiratory failure with hypercapnia: Secondary | ICD-10-CM | POA: Diagnosis not present

## 2024-06-09 DIAGNOSIS — J441 Chronic obstructive pulmonary disease with (acute) exacerbation: Secondary | ICD-10-CM | POA: Diagnosis not present

## 2024-06-09 DIAGNOSIS — I69322 Dysarthria following cerebral infarction: Secondary | ICD-10-CM | POA: Diagnosis not present

## 2024-06-09 MED ORDER — HYDRALAZINE HCL 25 MG PO TABS: 25.0000 mg | ORAL_TABLET | Freq: Three times a day (TID) | ORAL | 0 refills | Status: DC

## 2024-06-12 DIAGNOSIS — E1151 Type 2 diabetes mellitus with diabetic peripheral angiopathy without gangrene: Secondary | ICD-10-CM | POA: Diagnosis not present

## 2024-06-12 DIAGNOSIS — T22221D Burn of second degree of right elbow, subsequent encounter: Secondary | ICD-10-CM | POA: Diagnosis not present

## 2024-06-12 DIAGNOSIS — I69322 Dysarthria following cerebral infarction: Secondary | ICD-10-CM | POA: Diagnosis not present

## 2024-06-12 DIAGNOSIS — J441 Chronic obstructive pulmonary disease with (acute) exacerbation: Secondary | ICD-10-CM | POA: Diagnosis not present

## 2024-06-12 DIAGNOSIS — I130 Hypertensive heart and chronic kidney disease with heart failure and stage 1 through stage 4 chronic kidney disease, or unspecified chronic kidney disease: Secondary | ICD-10-CM | POA: Diagnosis not present

## 2024-06-12 DIAGNOSIS — I69392 Facial weakness following cerebral infarction: Secondary | ICD-10-CM | POA: Diagnosis not present

## 2024-06-12 DIAGNOSIS — J9612 Chronic respiratory failure with hypercapnia: Secondary | ICD-10-CM | POA: Diagnosis not present

## 2024-06-12 DIAGNOSIS — J9621 Acute and chronic respiratory failure with hypoxia: Secondary | ICD-10-CM | POA: Diagnosis not present

## 2024-06-12 DIAGNOSIS — I69351 Hemiplegia and hemiparesis following cerebral infarction affecting right dominant side: Secondary | ICD-10-CM | POA: Diagnosis not present

## 2024-06-13 ENCOUNTER — Telehealth: Payer: Self-pay

## 2024-06-13 NOTE — Telephone Encounter (Signed)
 Received call from Katelynn- [facility] regarding orders for patient.   She wanted to verify that we received order [id number].   Form is in PCP box for review and signing.   Chiquita JAYSON English, RN

## 2024-06-16 ENCOUNTER — Telehealth: Payer: Self-pay

## 2024-06-16 DIAGNOSIS — I69322 Dysarthria following cerebral infarction: Secondary | ICD-10-CM | POA: Diagnosis not present

## 2024-06-16 DIAGNOSIS — J9612 Chronic respiratory failure with hypercapnia: Secondary | ICD-10-CM | POA: Diagnosis not present

## 2024-06-16 DIAGNOSIS — I130 Hypertensive heart and chronic kidney disease with heart failure and stage 1 through stage 4 chronic kidney disease, or unspecified chronic kidney disease: Secondary | ICD-10-CM | POA: Diagnosis not present

## 2024-06-16 DIAGNOSIS — T22221D Burn of second degree of right elbow, subsequent encounter: Secondary | ICD-10-CM | POA: Diagnosis not present

## 2024-06-16 DIAGNOSIS — I69392 Facial weakness following cerebral infarction: Secondary | ICD-10-CM | POA: Diagnosis not present

## 2024-06-16 DIAGNOSIS — I69351 Hemiplegia and hemiparesis following cerebral infarction affecting right dominant side: Secondary | ICD-10-CM | POA: Diagnosis not present

## 2024-06-16 DIAGNOSIS — I10 Essential (primary) hypertension: Secondary | ICD-10-CM

## 2024-06-16 DIAGNOSIS — J441 Chronic obstructive pulmonary disease with (acute) exacerbation: Secondary | ICD-10-CM | POA: Diagnosis not present

## 2024-06-16 DIAGNOSIS — E1151 Type 2 diabetes mellitus with diabetic peripheral angiopathy without gangrene: Secondary | ICD-10-CM | POA: Diagnosis not present

## 2024-06-16 DIAGNOSIS — J9621 Acute and chronic respiratory failure with hypoxia: Secondary | ICD-10-CM | POA: Diagnosis not present

## 2024-06-16 MED ORDER — HYDRALAZINE HCL 25 MG PO TABS: 25.0000 mg | ORAL_TABLET | Freq: Three times a day (TID) | ORAL | 0 refills | Status: DC

## 2024-06-16 NOTE — Telephone Encounter (Signed)
 [place] home health nurse with [facility] calls nurse line in regards to home visit.   She reports the patient has been complaining of muscle spasms and is requesting a muscle relaxer.   Advised we would need to schedule an office visit.  I called patients wife and virtual apt scheduled for 8/27.  Wife also requests a refill on Hydralazine . Advised this was sent in on 8/15 to [facility]. She reports this needs to go to [facility].   Called [facility] and cancelled prescription.   Resent to [facility] per request.

## 2024-06-19 ENCOUNTER — Telehealth: Payer: Self-pay | Admitting: Cardiology

## 2024-06-19 DIAGNOSIS — T22221D Burn of second degree of right elbow, subsequent encounter: Secondary | ICD-10-CM | POA: Diagnosis not present

## 2024-06-19 DIAGNOSIS — E1151 Type 2 diabetes mellitus with diabetic peripheral angiopathy without gangrene: Secondary | ICD-10-CM | POA: Diagnosis not present

## 2024-06-19 DIAGNOSIS — J9621 Acute and chronic respiratory failure with hypoxia: Secondary | ICD-10-CM | POA: Diagnosis not present

## 2024-06-19 DIAGNOSIS — I69392 Facial weakness following cerebral infarction: Secondary | ICD-10-CM | POA: Diagnosis not present

## 2024-06-19 DIAGNOSIS — J441 Chronic obstructive pulmonary disease with (acute) exacerbation: Secondary | ICD-10-CM | POA: Diagnosis not present

## 2024-06-19 DIAGNOSIS — I130 Hypertensive heart and chronic kidney disease with heart failure and stage 1 through stage 4 chronic kidney disease, or unspecified chronic kidney disease: Secondary | ICD-10-CM | POA: Diagnosis not present

## 2024-06-19 DIAGNOSIS — I69322 Dysarthria following cerebral infarction: Secondary | ICD-10-CM | POA: Diagnosis not present

## 2024-06-19 DIAGNOSIS — J9612 Chronic respiratory failure with hypercapnia: Secondary | ICD-10-CM | POA: Diagnosis not present

## 2024-06-19 DIAGNOSIS — I69351 Hemiplegia and hemiparesis following cerebral infarction affecting right dominant side: Secondary | ICD-10-CM | POA: Diagnosis not present

## 2024-06-19 MED ORDER — APIXABAN 5 MG PO TABS: 5.0000 mg | ORAL_TABLET | Freq: Two times a day (BID) | ORAL | 0 refills | Status: DC

## 2024-06-19 NOTE — Telephone Encounter (Signed)
 Patient calling the office for samples of medication:   1.  What medication and dosage are you requesting samples for? apixaban (ELIQUIS) 5 MG TABS tablet  2.  Are you currently out of this medication? Yes

## 2024-06-19 NOTE — Telephone Encounter (Signed)
 Samples

## 2024-06-19 NOTE — Telephone Encounter (Signed)
 Please find out why they need samples. Looks like he just needs a refill. Overude for apt- but scheduled for 8/29. I can send in refil

## 2024-06-19 NOTE — Telephone Encounter (Signed)
 Please leave 1 week of Eliquis  5mg  for patient- will have money for copay next week

## 2024-06-20 ENCOUNTER — Telehealth: Payer: Self-pay | Admitting: *Deleted

## 2024-06-20 ENCOUNTER — Other Ambulatory Visit: Payer: Self-pay | Admitting: *Deleted

## 2024-06-20 MED ORDER — APIXABAN 5 MG PO TABS: 5.0000 mg | ORAL_TABLET | Freq: Two times a day (BID) | ORAL | 0 refills | Status: DC

## 2024-06-20 MED ORDER — APIXABAN 5 MG PO TABS: 5.0000 mg | ORAL_TABLET | Freq: Two times a day (BID) | ORAL | Status: DC

## 2024-06-20 NOTE — Telephone Encounter (Signed)
 Medication name/dosage: Samples List: Eliquis  5 mg  Administration instructions:   Reason for samples: Reason for samples: new start  Ordering provider:Jordan/ Eleanor Crews  *Once above information entered, route the phone encounter to [facility] SAMPLES and send Teams message to team member assigned to Samples for the day.

## 2024-06-20 NOTE — Progress Notes (Signed)
 Patient wife given samples of Eliquis  5mg  one box per Melissa Maccia, Pharm D.

## 2024-06-21 ENCOUNTER — Telehealth (INDEPENDENT_AMBULATORY_CARE_PROVIDER_SITE_OTHER): Admitting: Family Medicine

## 2024-06-21 ENCOUNTER — Encounter: Payer: Self-pay | Admitting: Family Medicine

## 2024-06-21 DIAGNOSIS — E1142 Type 2 diabetes mellitus with diabetic polyneuropathy: Secondary | ICD-10-CM

## 2024-06-21 MED ORDER — GABAPENTIN 300 MG PO CAPS: 300.0000 mg | ORAL_CAPSULE | Freq: Every day | ORAL | 3 refills | Status: DC

## 2024-06-21 NOTE — Progress Notes (Signed)
 [facility] Telemedicine Visit  Patient consented to have virtual visit and was identified by name and date of birth. Method of visit: Video  Encounter participants: Patient: Kyle [name] - located at home with wife Provider: Payton Coward - located at [facility] Others (if applicable): wife  Chief Complaint: Neuropathy  HPI:  Hx diabetic neuropathy Has had worsening diabetic neuropathy over the past few weeks.  Currently on gabapentin  600 mg nightly.  Hoping to increase the dose of this Also has a history of CKD  ROS: per HPI  Pertinent PMHx: CKD 3, T2DM, PAD, CHF, A-fib, COPD  Exam:  There were no vitals taken for this visit.  Respiratory: Speaking full sentences  Assessment & Plan Diabetic polyneuropathy associated with type 2 diabetes mellitus (HCC) CKD 3 with CrCl 46 per CMP from 02/2024, max dose of gabapentin  is 900mg /day Continue 600 mg of gabapentin  nightly Add on 300 mg of gabapentin  to take during the day in the morning or afternoon This puts him at his maximum dose.  If needed could consider switching to 300 3 times daily Discussed return precautions regarding sedative side effects.  Could decrease to 100 mg during the day if overly sedated with 300    Time spent during visit with patient: 20 minutes

## 2024-06-21 NOTE — Progress Notes (Unsigned)
 [name] [name] Date of Birth: [birth year] Medical Record #[id number]  History of Present Illness: [name] is seen for follow up CHF.  Last seen in February 2022. He has a history of systolic CHF with EF of 20-25% in May 2012. Coronary anatomy was normal by cath. He has a bicuspid AV with moderate AS/AI. He had moderate pulmonary HTN. He was treated medically and one of my old notes stated that his LV function showed marked improvement with  Echo in 2011 showing improvement in EF to 55-60%.   Unfortunately , he was lost to follow up and was not taking medication for over 2 years. He also has a history of HTN, hyperlipidemia, and PAD s/p right fem-pop BPG.   When seen in October 2015 he had evidence of recurrent CHF with severe HTN. Echo showed an EF of 20-25% with moderate AS/AI. He was started back on medication with lasix , carvedilol , and quinapril . He did not come back for follow up as instructed. He was seen in Jan. 2016 for surgical clearance for vascular surgery.  He was seen by Dr. Eliza for claudication and was found to have severe PAD. Revascularization recommended. He underwent aortobifemoral BPG in February 2016. Was seen by me in April 2019- had missed multiple appointments. He did have an Echo in July 2018 showing normalization of LV function with EF 55%.  He is  diabetic and on insulin .  He was admitted in January 2020 with hypotension and shock. Had malaise, N/V and sweats. Treated with antibiotics. Had respiratory failure with hypoxia. Sent home with oxygen. He had Afib initially then converted to NSR. Started on Eliquis . Continued on Coreg . Had AKI with creatinine up to 2.7 that improved to 1.03. LFTs were elevated. Echo showed EF 40-45%. There was RV dysfunction with moderate pulmonary HTN. Moderate AI with mild AS. CT negative for PE but showed advanced emphysema.   He presented in June 2020 with progressive right leg pain and nonhealing wound on his foot. Angiography showed  Occluded graft to the right leg and only peroneal run off. He underwent the following surgery on 04/24/19 by Dr Oris. Right femoral and repair with interposition 8 mm Hemashield graft from prior right limb of aortofemoral bypass into into deep femoral artery, #2 right femoral to peroneal bypass with translocated non-reversed great saphenous vein harvested from the left leg. He did not follow up with vascular surgery or cardiology post surgery. Has only followed up with pulmonary for COPD with hypoxemia and pulmonary HTN.   He was admitted 2/28-3/18/21 with abdominal pain, N/V, and diarrhea. He had acute renal failure and lactic acidosis of unclear etiology. He required mechanical ventilation for one day and intermittent dialysis but was not on dialysis at discharge. His acidosis cleared. He had encephalopathy. Cranial CT and MRI were negative. CT abd/pelvis was negative. He was switched from Eliquis  to Coumadin  at DC due to AKI. Lasix  was held.  He was readmitted 3/27-3/31/21 with acute CHF. He responded to IV diuresis. He was DC on lasix  20 mg bid. DC creatinine was 2.52.   He was admitted again 4/26-4/30/21 with acute hyperglycemic hyperosmolar state.  Last creatinine 1.74. lasix  was discontinued at discharge. He was seen by me in September 2021 and was doing well at that time.   Since his last visit he had repeat Echo in June 2022 showing EF 45-50%. Moderate AI.  He was seen by Dr Eliza and had repeat angiogram in August 2022 showing patent bypass grafts.  On follow up today he is feeling very well. Denies any SOB.  No chest pain. No claudication. Denies any ankle swelling. Weight is up 5 lbs since a year ago. Taking lasix  20 mg daily. Is compliant with meds. Not smoking. Was having some swelling/pain in his hands ? Gout. This has resolved.   Allergies as of 06/23/2024   No Known Allergies      Medication List        Accurate as of June 21, 2024  9:06 AM. If you have any questions, ask  your nurse or doctor.          acetaminophen  325 MG tablet Commonly known as: TYLENOL  Take 2 tablets (650 mg total) by mouth every 4 (four) hours as needed for mild pain (pain score 1-3).   albuterol  (2.5 MG/3ML) 0.083% nebulizer solution Commonly known as: PROVENTIL  Take 3 mLs (2.5 mg total) by nebulization every 4 (four) hours as needed for wheezing or shortness of breath.   amLODipine  5 MG tablet Commonly known as: NORVASC  Take 5 mg by mouth at bedtime.   apixaban  5 MG Tabs tablet Commonly known as: Eliquis  Take 1 tablet (5 mg total) by mouth 2 (two) times daily.   azithromycin  500 MG tablet Commonly known as: ZITHROMAX  Take 1 tablet (500 mg total) by mouth daily.   carvedilol  6.25 MG tablet Commonly known as: COREG  TAKE 1 TABLET TWICE DAILY WITH MEALS (MUST KEEP UPCOMING APPT. IN JULY 2025 WITH CARDIOLOGY FOR MORE REFILLS)   Centrum Men Tabs Take 1 tablet by mouth daily with breakfast.   doxycycline  100 MG capsule Commonly known as: VIBRAMYCIN  Take 1 capsule (100 mg total) by mouth 2 (two) times daily.   Droplet Pen Needles 32G X 4 MM Misc Generic drug: Insulin  Pen Needle USE AS DIRECTED WITH INSULIN    gabapentin  600 MG tablet Commonly known as: NEURONTIN  Take 1 tablet (600 mg total) by mouth at bedtime.   hydrALAZINE  25 MG tablet Commonly known as: APRESOLINE  Take 1 tablet (25 mg total) by mouth in the morning, at noon, and at bedtime.   isosorbide  mononitrate 30 MG 24 hr tablet Commonly known as: IMDUR  TAKE 1 TABLET EVERY DAY (PLEASE KEEP UPCOMING APPOINTMENT IN JULY 2025 WITH YOUR CARDIOLOGIST FOR MORE REFILLS)   levETIRAcetam  500 MG tablet Commonly known as: KEPPRA  Take 1 tablet (500 mg total) by mouth 2 (two) times daily.   mirtazapine  7.5 MG tablet Commonly known as: REMERON  Take 1 tablet (7.5 mg total) by mouth at bedtime.   Mucinex  600 MG 12 hr tablet Generic drug: guaiFENesin  Take 600 mg by mouth 2 (two) times daily.   NovoLOG  FlexPen 100  UNIT/ML FlexPen Generic drug: insulin  aspart Inject 0-10 Units into the skin See admin instructions. Inject 0-10 units into the skin three times a day before meals, per Sliding Scale: BGL 0-59 OR 400-1,000 = CALL MD; 60-150 = give nothing; 151-199 = 2 units; 200-249 = 4 units; 250-299 = 6 units; 300-349 = 8 units; 350-399 = 10 units   oxyCODONE  5 MG immediate release tablet Commonly known as: Oxy IR/ROXICODONE  Take 1 tablet (5 mg total) by mouth daily as needed for severe pain (pain score 7-10) (with dressing changes for burn).   OXYGEN Inhale 4 L/min into the lungs continuous.   pantoprazole  40 MG tablet Commonly known as: PROTONIX  Take 1 tablet (40 mg total) by mouth daily.   polyethylene glycol 17 g packet Commonly known as: MIRALAX  / GLYCOLAX  Take 17 g by mouth 2 (  two) times daily as needed for mild constipation or moderate constipation.   predniSONE  20 MG tablet Commonly known as: DELTASONE  Take 2 tablets (40 mg total) by mouth daily with breakfast.   rosuvastatin  10 MG tablet Commonly known as: CRESTOR  TAKE 1 TABLET AT BEDTIME   Trelegy Ellipta  200-62.5-25 MCG/ACT Aepb Generic drug: Fluticasone -Umeclidin-Vilant INHALE 1 PUFF EVERY DAY What changed: See the new instructions.   Tresiba  FlexTouch 100 UNIT/ML FlexTouch Pen Generic drug: insulin  degludec Inject 5 Units into the skin in the morning.         No Known Allergies  Past Medical History:  Diagnosis Date   Aortic insufficiency    MODERATE WITH A BICUSPID AORTIC VAVLE   Arterial occlusive disease    MULTILEVEL   Atrial fibrillation (HCC)    CHF (congestive heart failure) (HCC)    Chronic bronchitis (HCC)    CKD (chronic kidney disease) stage 3, GFR 30-59 ml/min (HCC)    COPD (chronic obstructive pulmonary disease) (HCC)    Dilated cardiomyopathy (HCC)    WITH EJECTION FRACTION DOWN TO 20-25%--WITH CONGESTIVE HEART FAILURE   Edema    LOWER EXTREMETIES   Emphysema of lung (HCC)    Hemorrhagic stroke  (HCC) 09/06/2023   right side defecit   Hypertension    ICH (intracerebral hemorrhage) (HCC) 09/10/2023   Insulin  dependent type 2 diabetes mellitus (HCC)    Normal coronary arteries 2009   Orthopnea    Peripheral arterial disease (HCC)    Pulmonary hypertension (HCC)     Past Surgical History:  Procedure Laterality Date   ABDOMINAL AORTAGRAM N/A 11/05/2014   Procedure: ABDOMINAL EZELLA;  Surgeon: Lonni GORMAN Blade, MD;  Location: [facility] CATH LAB;  Service: Cardiovascular;  Laterality: N/A;   ABDOMINAL AORTOGRAM W/LOWER EXTREMITY Bilateral 04/21/2019   Procedure: ABDOMINAL AORTOGRAM W/LOWER EXTREMITY;  Surgeon: Blade Lonni GORMAN, MD;  Location: [facility] INVASIVE CV LAB;  Service: Cardiovascular;  Laterality: Bilateral;   ABDOMINAL AORTOGRAM W/LOWER EXTREMITY Right 05/30/2021   Procedure: ABDOMINAL AORTOGRAM W/LOWER EXTREMITY;  Surgeon: Blade Lonni GORMAN, MD;  Location: [facility] INVASIVE CV LAB;  Service: Cardiovascular;  Laterality: Right;   AORTA - BILATERAL FEMORAL ARTERY BYPASS GRAFT N/A 12/18/2014   Procedure: AORTOBIFEMORAL BYPASS GRAFT;  Surgeon: Lonni GORMAN Blade, MD;  Location[facility] OR;  Service: Vascular;  Laterality: N/A;   CARDIAC CATHETERIZATION  2009   Nl Cors, EF 20%   COLONOSCOPY     ENDARTERECTOMY FEMORAL Left 12/18/2014   Procedure: ENDARTERECTOMY FEMORAL;  Surgeon: Lonni GORMAN Blade, MD;  Location: [facility] OR;  Service: Vascular;  Laterality: Left;   FALSE ANEURYSM REPAIR Right 04/24/2019   Procedure: REPAIR OF RIGHT FEMORAL ARTERY PSEUDOANEURYSM;  Surgeon: Oris Krystal FALCON, MD;  Location: [facility] OR;  Service: Vascular;  Laterality: Right;   FEMORAL-POPLITEAL BYPASS GRAFT  02/21/2011   right   FEMORAL-TIBIAL BYPASS GRAFT Right 04/24/2019   Procedure: REDO BYPASS GRAFT RIGHT FEMORAL-TIBIAL ARTERY USING LEFT LEG VEIN & HEMASHIELD GOLD 8mm GRAFT;  Surgeon: Oris Krystal FALCON, MD;  Location: [facility] OR;  Service: Vascular;  Laterality: Right;   IR FLUORO GUIDE CV LINE RIGHT  12/28/2019   IR  REMOVAL TUN CV CATH W/O FL  01/11/2020   IR US  GUIDE VASC ACCESS RIGHT  12/28/2019   OTHER SURGICAL HISTORY     POST RIGHT FEMOROPOPLITEAL BYPASS GRAFT   PERIPHERAL VASCULAR CATHETERIZATION  11/05/2014   Procedure: LOWER EXTREMITY ANGIOGRAPHY;  Surgeon: Lonni GORMAN Blade, MD;  Location: [facility] CATH LAB;  Service: Cardiovascular;;   VEIN HARVEST  Left 04/24/2019   Procedure: LEFT LEG SAPHENOUS VEIN HARVEST;  Surgeon: Oris Krystal FALCON, MD;  Location: [facility] OR;  Service: Vascular;  Laterality: Left;    Social History   Socioeconomic History   Marital status: Married    Spouse name: Not on file   Number of children: 1   Years of education: Not on file   Highest education level: Not on file  Occupational History   Occupation: retired    Associate Professor: [employer]  Tobacco Use   Smoking status: Former    Current packs/day: 0.00    Average packs/day: 1 pack/day for 40.0 years (40.0 ttl pk-yrs)    Types: Cigarettes    Start date: 10/15/1978    Quit date: 10/15/2018    Years since quitting: 5.6    Passive exposure: Never   Smokeless tobacco: Never   Tobacco comments:    quit 2010 and quit again 2019  Vaping Use   Vaping status: Never Used  Substance and Sexual Activity   Alcohol use: No    Alcohol/week: 0.0 standard drinks of alcohol   Drug use: No   Sexual activity: Not on file  Other Topics Concern   Not on file  Social History Narrative   Not on file   Social Drivers of Health   Financial Resource Strain: Low Risk  (03/05/2023)   Overall Financial Resource Strain (CARDIA)    Difficulty of Paying Living Expenses: Not hard at all  Food Insecurity: Patient Declined (04/18/2024)   Hunger Vital Sign    Worried About Running Out of Food in the Last Year: Patient declined    Ran Out of Food in the Last Year: Patient declined  Transportation Needs: Unmet Transportation Needs (04/18/2024)   PRAPARE - Administrator, Civil Service (Medical): Yes    Lack of Transportation  (Non-Medical): Yes  Physical Activity: Inactive (04/18/2024)   Exercise Vital Sign    Days of Exercise per Week: 0 days    Minutes of Exercise per Session: Not on file  Stress: No Stress Concern Present (04/18/2024)   [employer] of Occupational Health - Occupational Stress Questionnaire    Feeling of Stress: Not at all  Social Connections: Unknown (04/18/2024)   Social Connection and Isolation Panel    Frequency of Communication with Friends and Family: Patient declined    Frequency of Social Gatherings with Friends and Family: Patient declined    Attends Religious Services: Patient declined    Database administrator or Organizations: No    Attends Engineer, structural: Not on file    Marital Status: Married  Recent Concern: Social Connections - Moderately Isolated (03/07/2024)   Social Connection and Isolation Panel    Frequency of Communication with Friends and Family: Once a week    Frequency of Social Gatherings with Friends and Family: Once a week    Attends Religious Services: More than 4 times per year    Active Member of [employer] or Organizations: No    Attends Banker Meetings: Never    Marital Status: Married    Family History  Problem Relation Age of Onset   Diabetes Mother    Hyperlipidemia Sister    Hypertension Sister     Review of Systems: As noted in HPI.   All other systems were reviewed and are negative.  Physical Exam: There were no vitals taken for this visit. There were no vitals filed for this visit.     GENERAL:  Well  appearing, thin BM in NAD HEENT:  PERRL, EOMI, sclera are clear. Oropharynx is clear. NECK:  No jugular venous distention, carotid upstroke brisk and symmetric, no bruits, no thyromegaly or adenopathy LUNGS:  Clear to auscultation bilaterally CHEST:  Unremarkable HEART:  RRR,  PMI not displaced or sustained,S1 and S2 within normal limits, no S3, no S4: no clicks, no rubs, gr 1/6 systolic and diastolic murmur  RUSB.  ABD:  Soft, nontender. BS +, no masses or bruits. No hepatomegaly, no splenomegaly EXT:  Pedal pulses are weak.   no edema, no cyanosis no clubbing SKIN:  Warm and dry.  No rashes NEURO:  Alert and oriented x 3. Cranial nerves II through XII intact. PSYCH:  Cognitively intact   LABORATORY DATA: . Lab Results  Component Value Date   WBC 8.6 03/17/2024   HGB 10.8 (L) 03/17/2024   HCT 34.2 (L) 03/17/2024   PLT 216 03/17/2024   GLUCOSE 91 03/17/2024   CHOL 123 09/06/2023   TRIG 62 09/08/2023   HDL 40 (L) 09/06/2023   LDLCALC 61 09/06/2023   ALT 11 03/17/2024   AST 17 03/17/2024   NA 138 03/17/2024   K 4.0 03/17/2024   CL 105 03/17/2024   CREATININE 1.41 (H) 03/17/2024   BUN 21 03/17/2024   CO2 24 03/17/2024   TSH 0.382 04/08/2021   INR 1.6 (H) 03/17/2024   HGBA1C 5.8 (H) 03/07/2024    Labs dated 07/28/17: BUN 29, creatinine 1.03. Other chemistries normal. A1c 6.2%. Cholesterol 137, Triglycerides 63, HDL 43, LDL 81, TSH normal. Labs dated 12/21/17: cholesterol 123, triglycerides 64, HDL 41, LDL 69. A1c 6.3%. CMET and CBC normal. TSH normal. Dated 10/22/20: A1c 6.7%. creatinine 1.41, cholesterol 149, triglycerides 170, HDL 39, LDL 81. TSH, CBC, other chemistries normal.   Ecg today shows NSR with rate 71 with occ PVC, LVH with repolarization abnormality. I have personally reviewed and interpreted this study.   Echo: 05/06/17:  Study Conclusions   - Left ventricle: The cavity size was normal. Wall thickness was   increased in a pattern of mild LVH. Systolic function was normal.   The estimated ejection fraction was in the range of 50% to 55%.   Wall motion was normal; there were no regional wall motion   abnormalities. Doppler parameters are consistent with abnormal   left ventricular relaxation (grade 1 diastolic dysfunction). - Aortic valve: There was mild stenosis. There was mild to moderate   regurgitation. - Mitral valve: Calcified annulus. - Left atrium: The  atrium was mildly dilated. - Right ventricle: The cavity size was mildly dilated. - Right atrium: The atrium was mildly dilated. - Pulmonary arteries: Systolic pressure was moderately increased.   Impressions:   - Normal LV systolic function; mild LVH; mild diastolic   dysfunction; calcified aortic valve with mild AS (mean gradient   18 mmHg) and mild to moderate AI; mild LAE; mild RAE and RVE;   trace TR with moderately elevated pulmonary pressure.  Echo 11/21/18: Study Conclusions   - Left ventricle: The cavity size was normal. Systolic function was   mildly to moderately reduced. The estimated ejection fraction was   in the range of 40% to 45%. Diffuse hypokinesis. The study is not   technically sufficient to allow evaluation of LV diastolic   function. - Ventricular septum: The contour showed diastolic flattening. - Aortic valve: Valve mobility was restricted. There was mild   stenosis. There was moderate regurgitation. - Right ventricle: The cavity size was moderately dilated.  Wall   thickness was normal. Systolic function was moderately reduced. - Right atrium: The atrium was severely dilated. - Tricuspid valve: There was moderate regurgitation. - Pulmonary arteries: Systolic pressure was moderately increased.   PA peak pressure: 53 mm Hg (S).   Impressions:   - Compared to the prior study, there has been no significant   interval change.   Echo 12/13/19: IMPRESSIONS     1. Severe concentric LVH, consider evaluation for infiltrative  cardiomyopathies such as cardiac amyloidosis.   2. Impaired GLS: -14.6%. Left ventricular ejection fraction, by  estimation, is 65 to 70%. The left ventricle has normal function. The left  ventricle has no regional wall motion abnormalities. There is severe  concentric left ventricular hypertrophy. Left   ventricular diastolic parameters are consistent with Grade I diastolic  dysfunction (impaired relaxation).   3. Right ventricular  systolic function is normal. The right ventricular  size is normal. There is moderately elevated pulmonary artery systolic  pressure. The estimated right ventricular systolic pressure is 63.2 mmHg.   4. The mitral valve is normal in structure and function. No evidence of  mitral valve regurgitation. No evidence of mitral stenosis.   5. The aortic valve is normal in structure and function. Aortic valve  regurgitation is moderate. Mild to moderate aortic valve stenosis.   6. The inferior vena cava is normal in size with greater than 50%  respiratory variability, suggesting right atrial pressure of 3 mmHg.   Echo 04/08/21: IMPRESSIONS     1. Hypokinesis of the inferolateral wall with overall mild LV  dysfunction.   2. Left ventricular ejection fraction, by estimation, is 45 to 50%. The  left ventricle has mildly decreased function. The left ventricle  demonstrates regional wall motion abnormalities (see scoring  diagram/findings for description). There is moderate  left ventricular hypertrophy. Left ventricular diastolic parameters are  consistent with Grade I diastolic dysfunction (impaired relaxation).   3. Right ventricular systolic function is normal. The right ventricular  size is normal. There is moderately elevated pulmonary artery systolic  pressure.   4. Left atrial size was mildly dilated.   5. Right atrial size was mildly dilated.   6. The mitral valve is normal in structure. Mild mitral valve  regurgitation. No evidence of mitral stenosis.   7. The aortic valve is normal in structure. Aortic valve regurgitation is  moderate. Mild to moderate aortic valve stenosis.   8. The inferior vena cava is dilated in size with >50% respiratory  variability, suggesting right atrial pressure of 8 mmHg.     Assessment / Plan: 1. Chronic systolic CHF.  He has a history of cardiomyopathy- nonischemic. EF normalized in past when compliant with medication. Most recent Echo in 2022 showed  improved EF to 45-50% with moderate LVH. Reinforced importance of compliance with medical therapy. Low sodium diet. Continue  Coreg , hydralazine . Appears to be euvolemic. On lasix  20 mg daily. Avoid ARB/ACEi due to CKD. Will update Echo now.   2. HTN- well controlled.  3. PAD s/p redo right fem-pop bypass last June 2020. S/p aortobifemoral BPG Feb. 2016. Angiography in August 2022 showed patent grafts. Dopplers in August 2023 stable.   4. Hyperlipidemia. on Crestor  . Will check labs today. Goal LDL < 70  5. Biscuspid AV with mild AS/moderate AI. Will update Echo  6. Tobacco abuse- quit   7. DM now on insulin  per primary care. Last A1c 6.2%.   8. Advanced emphysema. On home oxygen intermittently. On Trelegy  9. Afib in setting of sepsis. In NSR now. Continue Eliquis . No clinical recurrence.   10. CKD stage 3a  Follow up in one year if Echo stable.

## 2024-06-22 ENCOUNTER — Other Ambulatory Visit: Payer: Self-pay | Admitting: Vascular Surgery

## 2024-06-22 ENCOUNTER — Other Ambulatory Visit: Payer: Self-pay

## 2024-06-22 DIAGNOSIS — J449 Chronic obstructive pulmonary disease, unspecified: Secondary | ICD-10-CM | POA: Diagnosis not present

## 2024-06-22 DIAGNOSIS — J9611 Chronic respiratory failure with hypoxia: Secondary | ICD-10-CM | POA: Diagnosis not present

## 2024-06-22 DIAGNOSIS — J9612 Chronic respiratory failure with hypercapnia: Secondary | ICD-10-CM | POA: Diagnosis not present

## 2024-06-22 DIAGNOSIS — I69322 Dysarthria following cerebral infarction: Secondary | ICD-10-CM | POA: Diagnosis not present

## 2024-06-22 DIAGNOSIS — I69392 Facial weakness following cerebral infarction: Secondary | ICD-10-CM | POA: Diagnosis not present

## 2024-06-22 DIAGNOSIS — T22221D Burn of second degree of right elbow, subsequent encounter: Secondary | ICD-10-CM | POA: Diagnosis not present

## 2024-06-22 DIAGNOSIS — I130 Hypertensive heart and chronic kidney disease with heart failure and stage 1 through stage 4 chronic kidney disease, or unspecified chronic kidney disease: Secondary | ICD-10-CM | POA: Diagnosis not present

## 2024-06-22 DIAGNOSIS — I69351 Hemiplegia and hemiparesis following cerebral infarction affecting right dominant side: Secondary | ICD-10-CM | POA: Diagnosis not present

## 2024-06-22 DIAGNOSIS — E1151 Type 2 diabetes mellitus with diabetic peripheral angiopathy without gangrene: Secondary | ICD-10-CM | POA: Diagnosis not present

## 2024-06-22 NOTE — Telephone Encounter (Signed)
 Patient will need refill at next appointment.  Have provider refill or contact PCP for management.

## 2024-06-22 NOTE — Telephone Encounter (Signed)
 Request from [facility] for ACC-CHEK FASTCLIX LANCETS (102). Don't see on current medication list. If you want patient to start this Rx please send instructions to pharmacy along with duration and dosage.Nelson Land, CMA

## 2024-06-23 ENCOUNTER — Ambulatory Visit: Attending: Cardiology | Admitting: Cardiology

## 2024-06-23 ENCOUNTER — Other Ambulatory Visit: Payer: Self-pay | Admitting: Family Medicine

## 2024-06-23 ENCOUNTER — Encounter: Payer: Self-pay | Admitting: Cardiology

## 2024-06-23 VITALS — BP 134/60 | HR 78 | Ht 71.0 in

## 2024-06-23 DIAGNOSIS — I5022 Chronic systolic (congestive) heart failure: Secondary | ICD-10-CM | POA: Diagnosis not present

## 2024-06-23 DIAGNOSIS — I482 Chronic atrial fibrillation, unspecified: Secondary | ICD-10-CM

## 2024-06-23 DIAGNOSIS — I739 Peripheral vascular disease, unspecified: Secondary | ICD-10-CM | POA: Diagnosis not present

## 2024-06-23 MED ORDER — APIXABAN 5 MG PO TABS: 5.0000 mg | ORAL_TABLET | Freq: Two times a day (BID) | ORAL | 3 refills | Status: DC

## 2024-06-23 MED ORDER — CARVEDILOL 6.25 MG PO TABS: 6.2500 mg | ORAL_TABLET | Freq: Two times a day (BID) | ORAL | 3 refills | Status: DC

## 2024-06-23 MED ORDER — ACCU-CHEK FASTCLIX LANCETS MISC: 102.0000 | Freq: Three times a day (TID) | 6 refills | Status: AC

## 2024-06-23 MED ORDER — ROSUVASTATIN CALCIUM 10 MG PO TABS: 10.0000 mg | ORAL_TABLET | Freq: Every day | ORAL | 3 refills | Status: DC

## 2024-06-23 MED ORDER — ISOSORBIDE MONONITRATE ER 30 MG PO TB24: 30.0000 mg | ORAL_TABLET | Freq: Every day | ORAL | 3 refills | Status: DC

## 2024-06-23 NOTE — Patient Instructions (Signed)
 Medication Instructions:  Continue all medications *If you need a refill on your cardiac medications before your next appointment, please call your pharmacy*  Lab Work: None ordered  Testing/Procedures: None ordered  Follow-Up: At [facility], you and your health needs are our priority.  As part of our continuing mission to provide you with exceptional heart care, our providers are all part of one team.  This team includes your primary Cardiologist (physician) and Advanced Practice Providers or APPs (Physician Assistants and Nurse Practitioners) who all work together to provide you with the care you need, when you need it.  Your next appointment:  1 year    Call in April to schedule August appointment     Provider:  Dr.Jordan   We recommend signing up for the patient portal called MyChart.  Sign up information is provided on this After Visit Summary.  MyChart is used to connect with patients for Virtual Visits (Telemedicine).  Patients are able to view lab/test results, encounter notes, upcoming appointments, etc.  Non-urgent messages can be sent to your provider as well.   To learn more about what you can do with MyChart, go to [email or web address].

## 2024-06-26 DIAGNOSIS — I69392 Facial weakness following cerebral infarction: Secondary | ICD-10-CM | POA: Diagnosis not present

## 2024-06-26 DIAGNOSIS — J449 Chronic obstructive pulmonary disease, unspecified: Secondary | ICD-10-CM | POA: Diagnosis not present

## 2024-06-26 DIAGNOSIS — I69322 Dysarthria following cerebral infarction: Secondary | ICD-10-CM | POA: Diagnosis not present

## 2024-06-26 DIAGNOSIS — I69351 Hemiplegia and hemiparesis following cerebral infarction affecting right dominant side: Secondary | ICD-10-CM | POA: Diagnosis not present

## 2024-06-26 DIAGNOSIS — T22221D Burn of second degree of right elbow, subsequent encounter: Secondary | ICD-10-CM | POA: Diagnosis not present

## 2024-06-26 DIAGNOSIS — I130 Hypertensive heart and chronic kidney disease with heart failure and stage 1 through stage 4 chronic kidney disease, or unspecified chronic kidney disease: Secondary | ICD-10-CM | POA: Diagnosis not present

## 2024-06-26 DIAGNOSIS — J9611 Chronic respiratory failure with hypoxia: Secondary | ICD-10-CM | POA: Diagnosis not present

## 2024-06-26 DIAGNOSIS — J9612 Chronic respiratory failure with hypercapnia: Secondary | ICD-10-CM | POA: Diagnosis not present

## 2024-06-26 DIAGNOSIS — E1151 Type 2 diabetes mellitus with diabetic peripheral angiopathy without gangrene: Secondary | ICD-10-CM | POA: Diagnosis not present

## 2024-06-27 ENCOUNTER — Other Ambulatory Visit (HOSPITAL_COMMUNITY): Payer: Self-pay

## 2024-06-27 ENCOUNTER — Other Ambulatory Visit: Payer: Self-pay

## 2024-06-27 ENCOUNTER — Telehealth: Payer: Self-pay | Admitting: Pharmacy Technician

## 2024-06-27 DIAGNOSIS — E1151 Type 2 diabetes mellitus with diabetic peripheral angiopathy without gangrene: Secondary | ICD-10-CM | POA: Diagnosis not present

## 2024-06-27 DIAGNOSIS — J9611 Chronic respiratory failure with hypoxia: Secondary | ICD-10-CM | POA: Diagnosis not present

## 2024-06-27 DIAGNOSIS — J449 Chronic obstructive pulmonary disease, unspecified: Secondary | ICD-10-CM | POA: Diagnosis not present

## 2024-06-27 DIAGNOSIS — I130 Hypertensive heart and chronic kidney disease with heart failure and stage 1 through stage 4 chronic kidney disease, or unspecified chronic kidney disease: Secondary | ICD-10-CM | POA: Diagnosis not present

## 2024-06-27 DIAGNOSIS — I69351 Hemiplegia and hemiparesis following cerebral infarction affecting right dominant side: Secondary | ICD-10-CM | POA: Diagnosis not present

## 2024-06-27 DIAGNOSIS — I69322 Dysarthria following cerebral infarction: Secondary | ICD-10-CM | POA: Diagnosis not present

## 2024-06-27 DIAGNOSIS — J9612 Chronic respiratory failure with hypercapnia: Secondary | ICD-10-CM | POA: Diagnosis not present

## 2024-06-27 DIAGNOSIS — T22221D Burn of second degree of right elbow, subsequent encounter: Secondary | ICD-10-CM | POA: Diagnosis not present

## 2024-06-27 DIAGNOSIS — I69392 Facial weakness following cerebral infarction: Secondary | ICD-10-CM | POA: Diagnosis not present

## 2024-06-27 NOTE — Telephone Encounter (Signed)
 Eliquis  application will be mailed out on 06/28/24 when someone is in the office   Provider portion will be uploaded to media

## 2024-06-28 ENCOUNTER — Other Ambulatory Visit (HOSPITAL_COMMUNITY): Payer: Self-pay

## 2024-06-28 DIAGNOSIS — I69322 Dysarthria following cerebral infarction: Secondary | ICD-10-CM | POA: Diagnosis not present

## 2024-06-28 DIAGNOSIS — I130 Hypertensive heart and chronic kidney disease with heart failure and stage 1 through stage 4 chronic kidney disease, or unspecified chronic kidney disease: Secondary | ICD-10-CM | POA: Diagnosis not present

## 2024-06-28 DIAGNOSIS — J9611 Chronic respiratory failure with hypoxia: Secondary | ICD-10-CM | POA: Diagnosis not present

## 2024-06-28 DIAGNOSIS — J449 Chronic obstructive pulmonary disease, unspecified: Secondary | ICD-10-CM | POA: Diagnosis not present

## 2024-06-28 DIAGNOSIS — I69351 Hemiplegia and hemiparesis following cerebral infarction affecting right dominant side: Secondary | ICD-10-CM | POA: Diagnosis not present

## 2024-06-28 DIAGNOSIS — E1151 Type 2 diabetes mellitus with diabetic peripheral angiopathy without gangrene: Secondary | ICD-10-CM | POA: Diagnosis not present

## 2024-06-28 DIAGNOSIS — T22221D Burn of second degree of right elbow, subsequent encounter: Secondary | ICD-10-CM | POA: Diagnosis not present

## 2024-06-28 DIAGNOSIS — I69392 Facial weakness following cerebral infarction: Secondary | ICD-10-CM | POA: Diagnosis not present

## 2024-06-28 DIAGNOSIS — J9612 Chronic respiratory failure with hypercapnia: Secondary | ICD-10-CM | POA: Diagnosis not present

## 2024-06-28 NOTE — Telephone Encounter (Signed)
 Patient can now use a healthwell grant to cover eliquis  Patient Advocate Encounter   The patient was approved for a [employer] grant that will help cover the cost of Eliquis  Total amount awarded, 7500.00.  Effective: 05/29/24 - 05/28/25   APW:[id number] ERW:[id number] Hmnle:[id number] PI:[id number]  [employer] ID: [id number]   Pharmacy provided with approval and processing information. Patient informed via mychart

## 2024-06-29 ENCOUNTER — Other Ambulatory Visit (HOSPITAL_COMMUNITY): Payer: Self-pay

## 2024-06-29 DIAGNOSIS — I69351 Hemiplegia and hemiparesis following cerebral infarction affecting right dominant side: Secondary | ICD-10-CM | POA: Diagnosis not present

## 2024-06-29 DIAGNOSIS — J9611 Chronic respiratory failure with hypoxia: Secondary | ICD-10-CM | POA: Diagnosis not present

## 2024-06-29 DIAGNOSIS — T22221D Burn of second degree of right elbow, subsequent encounter: Secondary | ICD-10-CM | POA: Diagnosis not present

## 2024-06-29 DIAGNOSIS — E1151 Type 2 diabetes mellitus with diabetic peripheral angiopathy without gangrene: Secondary | ICD-10-CM | POA: Diagnosis not present

## 2024-06-29 DIAGNOSIS — I69322 Dysarthria following cerebral infarction: Secondary | ICD-10-CM | POA: Diagnosis not present

## 2024-06-29 DIAGNOSIS — I69392 Facial weakness following cerebral infarction: Secondary | ICD-10-CM | POA: Diagnosis not present

## 2024-06-29 DIAGNOSIS — J9612 Chronic respiratory failure with hypercapnia: Secondary | ICD-10-CM | POA: Diagnosis not present

## 2024-06-29 DIAGNOSIS — J449 Chronic obstructive pulmonary disease, unspecified: Secondary | ICD-10-CM | POA: Diagnosis not present

## 2024-06-29 DIAGNOSIS — I130 Hypertensive heart and chronic kidney disease with heart failure and stage 1 through stage 4 chronic kidney disease, or unspecified chronic kidney disease: Secondary | ICD-10-CM | POA: Diagnosis not present

## 2024-06-30 ENCOUNTER — Other Ambulatory Visit (HOSPITAL_COMMUNITY): Payer: Self-pay

## 2024-06-30 ENCOUNTER — Telehealth: Payer: Self-pay

## 2024-06-30 DIAGNOSIS — J449 Chronic obstructive pulmonary disease, unspecified: Secondary | ICD-10-CM | POA: Diagnosis not present

## 2024-06-30 DIAGNOSIS — E1151 Type 2 diabetes mellitus with diabetic peripheral angiopathy without gangrene: Secondary | ICD-10-CM | POA: Diagnosis not present

## 2024-06-30 DIAGNOSIS — J9612 Chronic respiratory failure with hypercapnia: Secondary | ICD-10-CM | POA: Diagnosis not present

## 2024-06-30 DIAGNOSIS — I69392 Facial weakness following cerebral infarction: Secondary | ICD-10-CM | POA: Diagnosis not present

## 2024-06-30 DIAGNOSIS — I69322 Dysarthria following cerebral infarction: Secondary | ICD-10-CM | POA: Diagnosis not present

## 2024-06-30 DIAGNOSIS — J9611 Chronic respiratory failure with hypoxia: Secondary | ICD-10-CM | POA: Diagnosis not present

## 2024-06-30 DIAGNOSIS — T22221D Burn of second degree of right elbow, subsequent encounter: Secondary | ICD-10-CM | POA: Diagnosis not present

## 2024-06-30 DIAGNOSIS — I130 Hypertensive heart and chronic kidney disease with heart failure and stage 1 through stage 4 chronic kidney disease, or unspecified chronic kidney disease: Secondary | ICD-10-CM | POA: Diagnosis not present

## 2024-06-30 DIAGNOSIS — I69351 Hemiplegia and hemiparesis following cerebral infarction affecting right dominant side: Secondary | ICD-10-CM | POA: Diagnosis not present

## 2024-06-30 NOTE — Progress Notes (Signed)
 Pharmacy Medication Assistance Program Note    07/04/2024  Patient ID: Kyle [name], male   DOB: [birth year], 73 y.o.   MRN: [id number]     06/30/2024  Outreach Medication One  Manufacturer Medication One Novo Nordisk  [facility] [facility] Novolog   Dose of Novolog  SLIDING SCALE: 2-10 UNITS THREE TIMES DAILY (30U MAX)  Type of Assistance Manufacturer Assistance  Date Application Sent to Prescriber 07/04/2024  Name of Prescriber OTTO FAIRLY  Date Application Submitted to Manufacturer 06/30/2024  Method Application Sent to Manufacturer Online        07/04/2024  Patient ID: Kyle [name], male  DOB: 08/09/1951, 73 y.o.  MRN:  [id number]     06/30/2024  Outreach Medication Two  Manufacturer Medication Two Novo Nordisk  [facility] Drugs Tresiba   Dose of Tresiba  5 UNITS QD  Type of Sport and exercise psychologist  Date Application Sent to Prescriber 07/04/2024  Name of Prescriber OTTO FAIRLY  Method Application Sent to Manufacturer Online  Date Application Submitted to Manufacturer 06/30/2024     Submitted online.   PCP/RX PAGES IN DR. GERRIT BOX

## 2024-06-30 NOTE — Telephone Encounter (Signed)
 Patients wife reached out regarding re-enrollment with novo nordisk for patients insulins (Tresiba  and Novolog ). Agreed to have me attempt renewal online.

## 2024-07-03 ENCOUNTER — Other Ambulatory Visit: Payer: Self-pay

## 2024-07-03 DIAGNOSIS — J449 Chronic obstructive pulmonary disease, unspecified: Secondary | ICD-10-CM | POA: Diagnosis not present

## 2024-07-03 DIAGNOSIS — J9612 Chronic respiratory failure with hypercapnia: Secondary | ICD-10-CM | POA: Diagnosis not present

## 2024-07-03 DIAGNOSIS — I69392 Facial weakness following cerebral infarction: Secondary | ICD-10-CM | POA: Diagnosis not present

## 2024-07-03 DIAGNOSIS — I69322 Dysarthria following cerebral infarction: Secondary | ICD-10-CM | POA: Diagnosis not present

## 2024-07-03 DIAGNOSIS — J9611 Chronic respiratory failure with hypoxia: Secondary | ICD-10-CM | POA: Diagnosis not present

## 2024-07-03 DIAGNOSIS — I130 Hypertensive heart and chronic kidney disease with heart failure and stage 1 through stage 4 chronic kidney disease, or unspecified chronic kidney disease: Secondary | ICD-10-CM | POA: Diagnosis not present

## 2024-07-03 DIAGNOSIS — E1151 Type 2 diabetes mellitus with diabetic peripheral angiopathy without gangrene: Secondary | ICD-10-CM | POA: Diagnosis not present

## 2024-07-03 DIAGNOSIS — T22221D Burn of second degree of right elbow, subsequent encounter: Secondary | ICD-10-CM | POA: Diagnosis not present

## 2024-07-03 DIAGNOSIS — I69351 Hemiplegia and hemiparesis following cerebral infarction affecting right dominant side: Secondary | ICD-10-CM | POA: Diagnosis not present

## 2024-07-03 MED ORDER — PANTOPRAZOLE SODIUM 40 MG PO TBEC: 40.0000 mg | DELAYED_RELEASE_TABLET | Freq: Every day | ORAL | 0 refills | Status: DC

## 2024-07-04 ENCOUNTER — Telehealth: Payer: Self-pay | Admitting: Pharmacist

## 2024-07-04 DIAGNOSIS — I69322 Dysarthria following cerebral infarction: Secondary | ICD-10-CM | POA: Diagnosis not present

## 2024-07-04 DIAGNOSIS — J9611 Chronic respiratory failure with hypoxia: Secondary | ICD-10-CM | POA: Diagnosis not present

## 2024-07-04 DIAGNOSIS — J449 Chronic obstructive pulmonary disease, unspecified: Secondary | ICD-10-CM | POA: Diagnosis not present

## 2024-07-04 DIAGNOSIS — E1151 Type 2 diabetes mellitus with diabetic peripheral angiopathy without gangrene: Secondary | ICD-10-CM | POA: Diagnosis not present

## 2024-07-04 DIAGNOSIS — I69351 Hemiplegia and hemiparesis following cerebral infarction affecting right dominant side: Secondary | ICD-10-CM | POA: Diagnosis not present

## 2024-07-04 DIAGNOSIS — I130 Hypertensive heart and chronic kidney disease with heart failure and stage 1 through stage 4 chronic kidney disease, or unspecified chronic kidney disease: Secondary | ICD-10-CM | POA: Diagnosis not present

## 2024-07-04 DIAGNOSIS — J9612 Chronic respiratory failure with hypercapnia: Secondary | ICD-10-CM | POA: Diagnosis not present

## 2024-07-04 DIAGNOSIS — I69392 Facial weakness following cerebral infarction: Secondary | ICD-10-CM | POA: Diagnosis not present

## 2024-07-04 DIAGNOSIS — T22221D Burn of second degree of right elbow, subsequent encounter: Secondary | ICD-10-CM | POA: Diagnosis not present

## 2024-07-04 NOTE — Telephone Encounter (Signed)
 Reviewed and agree with Dr Rennis plan.

## 2024-07-04 NOTE — Telephone Encounter (Signed)
 Patient contacted for follow-up of insulin  regimen clarification  Patient's Spouse reports:  Novolog  (insulin  aspart) is on sliding scale 0-10 units TID before meals with patient taking 2-4 units on average Tresiba  (insulin  degludec) is 5 units every morning   Needed clarification for medication assistance  Total time with patient call and documentation of interaction: 5 minutes.

## 2024-07-07 DIAGNOSIS — I130 Hypertensive heart and chronic kidney disease with heart failure and stage 1 through stage 4 chronic kidney disease, or unspecified chronic kidney disease: Secondary | ICD-10-CM | POA: Diagnosis not present

## 2024-07-07 DIAGNOSIS — T22221D Burn of second degree of right elbow, subsequent encounter: Secondary | ICD-10-CM | POA: Diagnosis not present

## 2024-07-07 DIAGNOSIS — I69351 Hemiplegia and hemiparesis following cerebral infarction affecting right dominant side: Secondary | ICD-10-CM | POA: Diagnosis not present

## 2024-07-07 DIAGNOSIS — J449 Chronic obstructive pulmonary disease, unspecified: Secondary | ICD-10-CM | POA: Diagnosis not present

## 2024-07-07 DIAGNOSIS — E1151 Type 2 diabetes mellitus with diabetic peripheral angiopathy without gangrene: Secondary | ICD-10-CM | POA: Diagnosis not present

## 2024-07-07 DIAGNOSIS — I69322 Dysarthria following cerebral infarction: Secondary | ICD-10-CM | POA: Diagnosis not present

## 2024-07-07 DIAGNOSIS — J9612 Chronic respiratory failure with hypercapnia: Secondary | ICD-10-CM | POA: Diagnosis not present

## 2024-07-07 DIAGNOSIS — I69392 Facial weakness following cerebral infarction: Secondary | ICD-10-CM | POA: Diagnosis not present

## 2024-07-07 DIAGNOSIS — J9611 Chronic respiratory failure with hypoxia: Secondary | ICD-10-CM | POA: Diagnosis not present

## 2024-07-08 ENCOUNTER — Other Ambulatory Visit: Payer: Self-pay | Admitting: Cardiology

## 2024-07-10 ENCOUNTER — Emergency Department (HOSPITAL_COMMUNITY): Admission: EM | Admit: 2024-07-10 | Discharge: 2024-07-11 | Disposition: A | Discharge status: Home / Self Care

## 2024-07-10 ENCOUNTER — Emergency Department (HOSPITAL_COMMUNITY)

## 2024-07-10 ENCOUNTER — Other Ambulatory Visit: Payer: Self-pay

## 2024-07-10 DIAGNOSIS — E1151 Type 2 diabetes mellitus with diabetic peripheral angiopathy without gangrene: Secondary | ICD-10-CM | POA: Diagnosis not present

## 2024-07-10 DIAGNOSIS — J9612 Chronic respiratory failure with hypercapnia: Secondary | ICD-10-CM | POA: Diagnosis not present

## 2024-07-10 DIAGNOSIS — I4891 Unspecified atrial fibrillation: Secondary | ICD-10-CM | POA: Diagnosis not present

## 2024-07-10 DIAGNOSIS — Z7901 Long term (current) use of anticoagulants: Secondary | ICD-10-CM | POA: Insufficient documentation

## 2024-07-10 DIAGNOSIS — N189 Chronic kidney disease, unspecified: Secondary | ICD-10-CM | POA: Diagnosis not present

## 2024-07-10 DIAGNOSIS — I701 Atherosclerosis of renal artery: Secondary | ICD-10-CM | POA: Diagnosis not present

## 2024-07-10 DIAGNOSIS — I130 Hypertensive heart and chronic kidney disease with heart failure and stage 1 through stage 4 chronic kidney disease, or unspecified chronic kidney disease: Secondary | ICD-10-CM | POA: Diagnosis not present

## 2024-07-10 DIAGNOSIS — I729 Aneurysm of unspecified site: Secondary | ICD-10-CM | POA: Diagnosis not present

## 2024-07-10 DIAGNOSIS — I69392 Facial weakness following cerebral infarction: Secondary | ICD-10-CM | POA: Diagnosis not present

## 2024-07-10 DIAGNOSIS — I69322 Dysarthria following cerebral infarction: Secondary | ICD-10-CM | POA: Diagnosis not present

## 2024-07-10 DIAGNOSIS — I69351 Hemiplegia and hemiparesis following cerebral infarction affecting right dominant side: Secondary | ICD-10-CM | POA: Diagnosis not present

## 2024-07-10 DIAGNOSIS — J449 Chronic obstructive pulmonary disease, unspecified: Secondary | ICD-10-CM | POA: Diagnosis not present

## 2024-07-10 DIAGNOSIS — I728 Aneurysm of other specified arteries: Secondary | ICD-10-CM | POA: Diagnosis not present

## 2024-07-10 DIAGNOSIS — T22221D Burn of second degree of right elbow, subsequent encounter: Secondary | ICD-10-CM | POA: Diagnosis not present

## 2024-07-10 DIAGNOSIS — R1031 Right lower quadrant pain: Secondary | ICD-10-CM | POA: Diagnosis not present

## 2024-07-10 DIAGNOSIS — R1030 Lower abdominal pain, unspecified: Secondary | ICD-10-CM | POA: Diagnosis not present

## 2024-07-10 DIAGNOSIS — Z8673 Personal history of transient ischemic attack (TIA), and cerebral infarction without residual deficits: Secondary | ICD-10-CM | POA: Diagnosis not present

## 2024-07-10 DIAGNOSIS — Z794 Long term (current) use of insulin: Secondary | ICD-10-CM | POA: Diagnosis not present

## 2024-07-10 DIAGNOSIS — I10 Essential (primary) hypertension: Secondary | ICD-10-CM | POA: Diagnosis not present

## 2024-07-10 DIAGNOSIS — K5730 Diverticulosis of large intestine without perforation or abscess without bleeding: Secondary | ICD-10-CM | POA: Diagnosis not present

## 2024-07-10 DIAGNOSIS — T82898A Other specified complication of vascular prosthetic devices, implants and grafts, initial encounter: Secondary | ICD-10-CM | POA: Diagnosis not present

## 2024-07-10 DIAGNOSIS — I7142 Juxtarenal abdominal aortic aneurysm, without rupture: Secondary | ICD-10-CM | POA: Diagnosis not present

## 2024-07-10 DIAGNOSIS — J9611 Chronic respiratory failure with hypoxia: Secondary | ICD-10-CM | POA: Diagnosis not present

## 2024-07-10 LAB — URINALYSIS, ROUTINE W REFLEX MICROSCOPIC
Bilirubin Urine: NEGATIVE
Glucose, UA: NEGATIVE mg/dL
Hgb urine dipstick: NEGATIVE
Ketones, ur: NEGATIVE mg/dL
Leukocytes,Ua: NEGATIVE
Nitrite: NEGATIVE
Protein, ur: NEGATIVE mg/dL
Specific Gravity, Urine: 1.018 (ref 1.005–1.030)
pH: 5 (ref 5.0–8.0)

## 2024-07-10 LAB — COMPREHENSIVE METABOLIC PANEL WITH GFR
ALT: 15 U/L (ref 0–44)
AST: 20 U/L (ref 15–41)
Albumin: 3 g/dL — ABNORMAL LOW (ref 3.5–5.0)
Alkaline Phosphatase: 55 U/L (ref 38–126)
Anion gap: 10 (ref 5–15)
BUN: 26 mg/dL — ABNORMAL HIGH (ref 8–23)
CO2: 25 mmol/L (ref 22–32)
Calcium: 8.9 mg/dL (ref 8.9–10.3)
Chloride: 107 mmol/L (ref 98–111)
Creatinine, Ser: 1.4 mg/dL — ABNORMAL HIGH (ref 0.61–1.24)
GFR, Estimated: 53 mL/min — ABNORMAL LOW (ref >60)
Glucose, Bld: 254 mg/dL — ABNORMAL HIGH (ref 70–99)
Potassium: 4 mmol/L (ref 3.5–5.1)
Sodium: 142 mmol/L (ref 135–145)
Total Bilirubin: 0.2 mg/dL (ref 0.0–1.2)
Total Protein: 6.2 g/dL — ABNORMAL LOW (ref 6.5–8.1)

## 2024-07-10 LAB — CBC WITH DIFFERENTIAL/PLATELET
Abs Immature Granulocytes: 0.03 K/uL (ref 0.00–0.07)
Basophils Absolute: 0 K/uL (ref 0.0–0.1)
Basophils Relative: 1 %
Eosinophils Absolute: 0.2 K/uL (ref 0.0–0.5)
Eosinophils Relative: 3 %
HCT: 33.2 % — ABNORMAL LOW (ref 39.0–52.0)
Hemoglobin: 10.7 g/dL — ABNORMAL LOW (ref 13.0–17.0)
Immature Granulocytes: 0 %
Lymphocytes Relative: 19 %
Lymphs Abs: 1.4 K/uL (ref 0.7–4.0)
MCH: 29.4 pg (ref 26.0–34.0)
MCHC: 32.2 g/dL (ref 30.0–36.0)
MCV: 91.2 fL (ref 80.0–100.0)
Monocytes Absolute: 0.8 K/uL (ref 0.1–1.0)
Monocytes Relative: 11 %
Neutro Abs: 4.9 K/uL (ref 1.7–7.7)
Neutrophils Relative %: 66 %
Platelets: 197 K/uL (ref 150–400)
RBC: 3.64 MIL/uL — ABNORMAL LOW (ref 4.22–5.81)
RDW: 17.9 % — ABNORMAL HIGH (ref 11.5–15.5)
WBC: 7.3 K/uL (ref 4.0–10.5)
nRBC: 0 % (ref 0.0–0.2)

## 2024-07-10 LAB — LIPASE, BLOOD: Lipase: 30 U/L (ref 11–51)

## 2024-07-10 LAB — PROTIME-INR
INR: 1.5 — ABNORMAL HIGH (ref 0.8–1.2)
Prothrombin Time: 18.5 s — ABNORMAL HIGH (ref 11.4–15.2)

## 2024-07-10 LAB — APTT: aPTT: 54 s — ABNORMAL HIGH (ref 24–36)

## 2024-07-10 MED ORDER — IOHEXOL 350 MG/ML SOLN
100.0000 mL | Freq: Once | INTRAVENOUS | Status: AC | PRN
  Administered 2024-07-10: 100 mL via INTRAVENOUS

## 2024-07-10 NOTE — ED Notes (Signed)
 Ptar called

## 2024-07-10 NOTE — ED Provider Notes (Signed)
 [place] EMERGENCY DEPARTMENT AT [facility] Provider Note   CSN: [id number] Arrival date & time: 07/10/24  1520     Patient presents with: Groin Pain   Kyle [name] is a 73 y.o. male.    Groin Pain Pertinent negatives include no chest pain, no abdominal pain and no shortness of breath.      Patient presents because of right groin pain.  Has been present for a long time.  Feels like it has began worse over the past 2 weeks.  Patient states that he has no new numbness or tingling of his right lower extremity.  Patient states that he does feel like over the past week or 2 he has noticed likely some increasing pain in this site.  He is not sure when he last saw vascular surgery.  No other abdominal pain.  No nausea or vomiting.  Still passing flatulence.  Last bowel movement was 1 to 2 days ago.  Otherwise, no acute complaints.  No dysuria ' Previous medical history reviewed : He presented in June 2020 with progressive right leg pain and nonhealing wound on his foot. Angiography showed Occluded graft to the right leg and only peroneal run off. He underwent the following surgery on 04/24/19 by Dr Oris. Right femoral and repair with interposition 8 mm Hemashield graft from prior right limb of aortofemoral bypass into into deep femoral artery, #2 right femoral to peroneal bypass with translocated non-reversed great saphenous vein harvested from the left leg. He did not follow up with vascular surgery or cardiology post surgery.   Patient last followed up with vascular surgery in January 2025. Noted pseudoaneurysm. Palpable. Nonpaintful.  Felt that he was a poor surgical candidate due to his comorbidities.  Given that the pseudoaneurysm has not changed in size that time, discussed ongoing monitoring right and moved forward with surgery.  Will need to see him again in 3 months.   Prior to Admission medications   Medication Sig Start Date End Date Taking? Authorizing Provider   Accu-Chek FastClix Lancets MISC Inject 102 each into the skin 3 (three) times daily. 06/23/24   Lafe Domino, DO  ACCU-CHEK GUIDE TEST test strip 1 each by Other route 3 (three) times daily. 04/19/24   [provider]  acetaminophen  (TYLENOL ) 325 MG tablet Take 2 tablets (650 mg total) by mouth every 4 (four) hours as needed for mild pain (pain score 1-3). 09/27/23   Love, Sharlet RAMAN, PA-C  albuterol  (PROVENTIL ) (2.5 MG/3ML) 0.083% nebulizer solution Take 3 mLs (2.5 mg total) by nebulization every 4 (four) hours as needed for wheezing or shortness of breath. 12/08/23   Laurence Locus, DO  amLODipine  (NORVASC ) 5 MG tablet Take 5 mg by mouth at bedtime.    [provider]  apixaban  (ELIQUIS ) 5 MG TABS tablet Take 1 tablet (5 mg total) by mouth 2 (two) times daily. 06/23/24   [place], Peter M, MD  carvedilol  (COREG ) 6.25 MG tablet Take 1 tablet (6.25 mg total) by mouth 2 (two) times daily with a meal. 06/23/24   [place], Peter M, MD  DROPLET PEN NEEDLES 32G X 4 MM MISC USE AS DIRECTED WITH INSULIN  01/03/24   Christia Budds, MD  Fluticasone -Umeclidin-Vilant (TRELEGY ELLIPTA ) 200-62.5-25 MCG/ACT AEPB INHALE 1 PUFF EVERY DAY Patient taking differently: Inhale 1 puff into the lungs in the morning. 11/15/23   Jude Harden GAILS, MD  gabapentin  (NEURONTIN ) 300 MG capsule Take 1 capsule (300 mg total) by mouth daily. In the morning or  afternoon 06/21/24   Romelle Booty, MD  gabapentin  (NEURONTIN ) 600 MG tablet Take 1 tablet (600 mg total) by mouth at bedtime. 04/21/24   Christia Budds, MD  hydrALAZINE  (APRESOLINE ) 25 MG tablet Take 1 tablet (25 mg total) by mouth in the morning, at noon, and at bedtime. 06/16/24   Adele Song, MD  isosorbide  mononitrate (IMDUR ) 30 MG 24 hr tablet Take 1 tablet (30 mg total) by mouth daily. 06/23/24   [place], Peter M, MD  levETIRAcetam  (KEPPRA ) 500 MG tablet Take 1 tablet (500 mg total) by mouth 2 (two) times daily. 01/11/24   Christia Budds, MD  mirtazapine  (REMERON ) 7.5 MG  tablet Take 1 tablet (7.5 mg total) by mouth at bedtime. 06/03/24   Hindel, Song, MD  MUCINEX  600 MG 12 hr tablet Take 600 mg by mouth 2 (two) times daily.    [provider]  Multiple Vitamins-Minerals (CENTRUM MEN) TABS Take 1 tablet by mouth daily with breakfast.    [provider]  NOVOLOG  FLEXPEN 100 UNIT/ML FlexPen Inject 0-10 Units into the skin See admin instructions. Inject 0-10 units into the skin three times a day before meals, per Sliding Scale: BGL 0-59 OR 400-1,000 = CALL MD; 60-150 = give nothing; 151-199 = 2 units; 200-249 = 4 units; 250-299 = 6 units; 300-349 = 8 units; 350-399 = 10 units    [provider]  OXYGEN  Inhale 4 L/min into the lungs continuous.    [provider]  pantoprazole  (PROTONIX ) 40 MG tablet Take 1 tablet (40 mg total) by mouth daily. 07/03/24   Adele Song, MD  polyethylene glycol (MIRALAX  / GLYCOLAX ) 17 g packet Take 17 g by mouth 2 (two) times daily as needed for mild constipation or moderate constipation.    [provider]  rosuvastatin  (CRESTOR ) 10 MG tablet Take 1 tablet (10 mg total) by mouth at bedtime. 06/23/24   [place], Peter M, MD  TRESIBA  FLEXTOUCH 100 UNIT/ML FlexTouch Pen Inject 5 Units into the skin in the morning.    [provider]    Allergies: Patient has no known allergies.    Review of Systems  Constitutional:  Negative for chills and fever.  HENT:  Negative for ear pain and sore throat.   Eyes:  Negative for pain and visual disturbance.  Respiratory:  Negative for cough and shortness of breath.   Cardiovascular:  Negative for chest pain and palpitations.  Gastrointestinal:  Negative for abdominal pain and vomiting.  Genitourinary:  Negative for dysuria and hematuria.  Musculoskeletal:  Negative for arthralgias and back pain.  Skin:  Negative for color change and rash.  Neurological:  Negative for seizures and syncope.  All other systems reviewed and are negative.   Updated Vital  Signs BP (!) 180/95 (BP Location: Right Arm)   Pulse 94   Temp 98.9 F (37.2 C) (Oral)   Resp 18   Ht 5' 11 (1.803 m)   Wt 69 kg   SpO2 99%   BMI 21.22 kg/m   Physical Exam Vitals and nursing note reviewed.  Constitutional:      General: He is not in acute distress.    Appearance: He is well-developed.  HENT:     Head: Normocephalic and atraumatic.  Eyes:     Conjunctiva/sclera: Conjunctivae normal.  Cardiovascular:     Rate and Rhythm: Normal rate and regular rhythm.     Heart sounds: No murmur heard. Pulmonary:     Effort: Pulmonary effort is normal. No respiratory distress.  Breath sounds: Normal breath sounds.  Abdominal:     Palpations: Abdomen is soft.     Tenderness: There is no abdominal tenderness.   Musculoskeletal:        General: No swelling.     Cervical back: Neck supple.  Skin:    General: Skin is warm and dry.     Capillary Refill: Capillary refill takes less than 2 seconds.  Neurological:     Mental Status: He is alert.  Psychiatric:        Mood and Affect: Mood normal.     (all labs ordered are listed, but only abnormal results are displayed) Labs Reviewed  CBC WITH DIFFERENTIAL/PLATELET - Abnormal; Notable for the following components:      Result Value   RBC 3.64 (*)    Hemoglobin 10.7 (*)    HCT 33.2 (*)    RDW 17.9 (*)    All other components within normal limits  COMPREHENSIVE METABOLIC PANEL WITH GFR - Abnormal; Notable for the following components:   Glucose, Bld 254 (*)    BUN 26 (*)    Creatinine, Ser 1.40 (*)    Total Protein 6.2 (*)    Albumin  3.0 (*)    GFR, Estimated 53 (*)    All other components within normal limits  PROTIME-INR - Abnormal; Notable for the following components:   Prothrombin  Time 18.5 (*)    INR 1.5 (*)    All other components within normal limits  APTT - Abnormal; Notable for the following components:   aPTT 54 (*)    All other components within normal limits  LIPASE, BLOOD  URINALYSIS, ROUTINE  W REFLEX MICROSCOPIC    EKG: None  Radiology: CT Angio Aortobifemoral W and/or Wo Contrast Result Date: 07/10/2024 EXAM: CTA ABDOMEN AND PELVIS WITH AND WITHOUT CONTRAST AND RUNOFF CTA OF THE LOWER EXTREMITIES WITH CONTRAST 07/10/2024 07:46:56 PM TECHNIQUE: CTA images of the abdomen, pelvis and lower extremities with and without intravenous contrast. Three-dimensional MIP/volume rendered formations were performed. Automated exposure control, iterative reconstruction, and/or weight based adjustment of the mA/kV was utilized to reduce the radiation dose to as low as reasonably achievable. 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 Groin pain x 1 week. Has an appt for said pain in a few days but wanted to be seen today. Hx of stroke with right sided deficits. 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 Additionally, there is aneurysmal infiltration resulting in a draping aorta appearance over the posterior vertebral body suggesting perianeurysmal hemorrhage. This is best seen on image 69/5. Since the prior examination, the irregular aneurysm arising from the juxtarenal aorta has enlarged now measuring at least 11 x 18 mm though the draped appearance related to suspected hemorrhage appears unchanged. Aortobifemoral bypass grafting has been performed with proximal anastomosis immediately subjacent to the left renal artery. The bypass graft is widely patent. The native infrarenal abdominal aorta, common iliac arteries, and external iliac arteries are occluded. There is relatively weak constitution of the left internal iliac artery peripheral vasculature by retroperitoneal collaterals. CELIAC TRUNK: Celiac axis is widely  patent. SUPERIOR MESENTERIC ARTERY: Superior mesenteric artery demonstrates a less than 50% stenosis at its origin. Distally widely patent. Variant anatomy noted with an accessory right hepatic artery arising from the superior  mesenteric artery. 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 Associatedmarked right renal atrophy. Left renal artery is widely patent. Renal artery demonstrates normal vascular morphology. No aneurysm or dissection. There is marked asymmetric right renal cortical atrophy. Left kidney is normal in size and position. RIGHT ILIAC ARTERIES: Occluded. RIGHT FEMORAL ARTERIES: A 3.2 x 3.6 x 4.2 cm pseudoaneurysm is seen at the right femoral bypass anastomosis with patency of the pseudoaneurysm sac and moderate surrounding hemorrhage within the adjacent soft tissues. Since the prior examination, pseudoaneurysm at the right femoral anastomosis has both increased in size and further recanalized. There is increasing perianeurysmal hemorrhage within the right groin. The right superficial femoral artery is patent but demonstrates relatively slow filling with poor opacification peripheral to its distal segment. The profunda femoral artery is patent. Patency of the popliteal artery and runoff vessels of the right lower extremity is not confirmed on this examination. LEFT ILIAC ARTERIES: Occluded. LEFT FEMORAL ARTERIES: The left superficial femoral artery is occluded. There is variant anatomy of the left profunda femoral artery with a separate take off of the circumflex femoral artery with patency of the profunda femoral arterial branches. LEFT POPLITEAL ARTERY: The left P1 segment popliteal artery is occluded. There is reconstitution of the P2 segment of the left popliteal artery by genicular collaterals. LEFT CALF ARTERIES: The  anterior tibial artery is occluded. The posterior tibial artery reconstitutes the occluded peroneal artery. There is collateral filling of the plantar arch of the left foot. The dorsalis pedis artery is not patent. ABDOMEN AND PELVIS: LOWER CHEST: Visualized portion of the lower chest demonstrates cardiomegaly. LIVER: Unremarkable GALLBLADDER AND BILE DUCTS: Gallbladder is unremarkable. No biliary ductal dilatation. SPLEEN: The spleen is unremarkable. PANCREAS: 8 x 15 mm hypodense lesion within the mid body of the pancreas appears unchanged though was not well characterized on this examination. ADRENAL GLANDS: Bilateral adrenal glands demonstrate no acute abnormality. KIDNEYS, URETERS AND BLADDER: No stones in the kidneys or ureters. No hydronephrosis. No evidence of perinephric or periureteral stranding. Urinary bladder is unremarkable. GI AND BOWEL: Moderate colonic and rectal stool burden without evidence of obstruction. Moderate descending and sigmoid colonic diverticulosis. The stomach, small bowel, and large bowel are otherwise unremarkable. Appendix normal. REPRODUCTIVE: Reproductive organs are unremarkable. PERITONEUM AND RETROPERITONEUM: No ascites or free air. LYMPH NODES: No evidence of lymphadenopathy. BONES AND SOFT TISSUES: Osseous structures are age appropriate. No acute bone abnormality. 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 This appears unchanged from prior examination. IMPRESSION: 1. 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 2. Pseudoaneurysm (3.2 x 3.6 x 4.2 cm) at the right femoral bypass anastomosis with moderate surrounding hemorrhage, increased in size and further recanalized since prior examination. 3. 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 Associated marked right renal cortical atrophy. 4. Aortobifemoral bypass grafting with widely patent graft. 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 5. Relatively slow filling of the right superficial femoral artery Patency of the right lower extremity vasculature is not confirmed on this examination. 6. Single-vessel reconstituted flow to the left foot.  7. Moderate colonic and rectal stool burden without evidence of obstruction. Moderate descending and sigmoid colonic diverticulosis without evidence of diverticulitis. Electronically signed by: Dorethia Molt MD 07/10/2024 08:44 PM EDT RP Workstation: [id number]     Procedures   Medications Ordered in the ED  iohexol  (OMNIPAQUE ) 350 MG/ML injection 100 mL (100 mLs Intravenous Contrast Given 07/10/24 1924)    Clinical Course as of 07/10/24 2230  Mon Jul 10, 2024  2225 Thursday appt with Lanis.    [TL]    Clinical Course User Index [TL] Simon Lavonia SAILOR, MD                                 Medical Decision Making Amount and/or Complexity of Data Reviewed Labs: ordered. Radiology: ordered.  Risk Prescription drug management.     Patient presents because of right groin pain.  Has been present for a long time.  Feels like it has began worse over the past 2 weeks.  Patient states that he has no new numbness or tingling of his right lower extremity.  Patient states that he does feel like over the past week or 2 he has noticed likely some increasing pain in this site.  He is not sure when he last saw vascular surgery.  No other abdominal pain.  No nausea or vomiting.  Still passing flatulence.  Last bowel movement was 1 to 2 days ago.  Otherwise, no acute complaints.  No dysuria ' Previous medical history reviewed : He presented in June 2020 with progressive right leg pain and nonhealing wound on his foot. Angiography showed Occluded graft to the right leg and  only peroneal run off. He underwent the following surgery on 04/24/19 by Dr Oris. Right femoral and repair with interposition 8 mm Hemashield graft from prior right limb of aortofemoral bypass into into deep femoral artery, #2 right femoral to peroneal bypass with translocated non-reversed great saphenous vein harvested from the left leg. He did not follow up with vascular surgery or cardiology post surgery.   Patient last followed up with vascular surgery in January 2025. Noted pseudoaneurysm. Palpable. Nonpaintful.  Felt that he was a poor surgical candidate due to his comorbidities.  Given that the pseudoaneurysm has not changed in size that time, discussed ongoing monitoring right and moved forward with surgery.  Will need to see him again in 3 months.    Upon exam, patient at mental baseline.  No focal deficits appreciated.   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  Unclear whether or not this is increasing in size.  This has been documented in prior notes.  Patient does have Doppler posterior tibial dorsal pedal pulses.  Completed this myself.  Good capillary refills as well of the bilateral Les.   Did perform CTA of the abdomen with runoff.  Findings as above.  In terms of increase in size pseudoaneurysm in the right Pham, I did speak to vascular surgery Dr. Gretta. .  Dr. Gretta reviewed images and saw the patient including the right fem aneurysm as well as the increase in the size of the  juxtarenal.  They feel like patient safer dispo home with follow-up in clinic on Thursday.  This can be managed outpatient this point time. No need for intervention from an emergent standpoint.   Patient remained hemodynamically stable throughout the ED stay.  Neurologically intact.     Final diagnoses:  Right inguinal pain  Pseudoaneurysm (  [facility])    ED Discharge Orders     None          Simon Lavonia SAILOR, MD 07/10/24 2231

## 2024-07-10 NOTE — ED Triage Notes (Addendum)
 Groin pain x 1 week. Has an appt for said pain in a few days but wanted to be seen today  Hx of stroke with right sided deficits.  Took 1gram tylenol  prior to arrival.

## 2024-07-10 NOTE — Consult Note (Signed)
 Hospital Consult      History of Present Illness: This is a 73 y.o. 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  He had an aortobifem 12/18/2014 by Dr. Eliza.  He then had a right femoral pseudoaneurysm repair with interposition graft and a right femoral to peroneal bypass with vein by Dr. Oris on 04/24/2019.  Has been followed most recently by Dr. 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  Earlier this year felt to be a very high surgical risk and patient did not want surgery.  Plan was to continue to follow this.  States he came to the ED since he felt it got a little bit bigger.  Denies pain.  Past Medical History:  Diagnosis Date   Aortic insufficiency    MODERATE WITH A BICUSPID AORTIC VAVLE   Arterial occlusive disease    MULTILEVEL   Atrial fibrillation (HCC)    CHF (congestive heart failure) (HCC)    Chronic bronchitis (HCC)    CKD (chronic kidney disease) stage 3, GFR 30-59 ml/min (HCC)    COPD (chronic obstructive pulmonary disease) (HCC)    Dilated cardiomyopathy (HCC)    WITH EJECTION FRACTION DOWN TO 20-25%--WITH CONGESTIVE HEART FAILURE   Edema    LOWER EXTREMETIES   Emphysema of lung (HCC)    Hemorrhagic stroke (HCC) 09/06/2023   right side defecit   Hypertension    ICH (intracerebral hemorrhage) (HCC) 09/10/2023   Insulin  dependent type 2 diabetes mellitus (HCC)    Normal coronary arteries 2009   Orthopnea    Peripheral arterial disease (HCC)    Pulmonary hypertension (HCC)     Past Surgical History:  Procedure Laterality Date   ABDOMINAL AORTAGRAM N/A 11/05/2014   Procedure: ABDOMINAL EZELLA;  Surgeon: Lonni GORMAN Eliza, MD;  Location: [facility] CATH LAB;  Service: Cardiovascular;  Laterality: N/A;   ABDOMINAL AORTOGRAM W/LOWER EXTREMITY Bilateral 04/21/2019   Procedure: ABDOMINAL AORTOGRAM W/LOWER  EXTREMITY;  Surgeon: Eliza Lonni GORMAN, MD;  Location: [facility] INVASIVE CV LAB;  Service: Cardiovascular;  Laterality: Bilateral;   ABDOMINAL AORTOGRAM W/LOWER EXTREMITY Right 05/30/2021   Procedure: ABDOMINAL AORTOGRAM W/LOWER EXTREMITY;  Surgeon: Eliza Lonni GORMAN, MD;  Location: [facility] INVASIVE CV LAB;  Service: Cardiovascular;  Laterality: Right;   AORTA - BILATERAL FEMORAL ARTERY BYPASS GRAFT N/A 12/18/2014   Procedure: AORTOBIFEMORAL BYPASS GRAFT;  Surgeon: Lonni GORMAN Eliza, MD;  Location: [facility] OR;  Service: Vascular;  Laterality: N/A;   CARDIAC CATHETERIZATION  2009   Nl Cors, EF 20%   COLONOSCOPY     ENDARTERECTOMY FEMORAL Left 12/18/2014   Procedure: ENDARTERECTOMY FEMORAL;  Surgeon: Lonni GORMAN Eliza, MD;  Location: [facility] OR;  Service: Vascular;  Laterality: Left;   FALSE ANEURYSM REPAIR Right 04/24/2019   Procedure: REPAIR OF RIGHT FEMORAL ARTERY PSEUDOANEURYSM;  Surgeon: Oris Krystal FALCON, MD;  Location: [facility] OR;  Service: Vascular;  Laterality: Right;   FEMORAL-POPLITEAL BYPASS GRAFT  02/21/2011   right   FEMORAL-TIBIAL BYPASS GRAFT Right 04/24/2019   Procedure: REDO BYPASS GRAFT RIGHT FEMORAL-TIBIAL ARTERY USING LEFT LEG VEIN & HEMASHIELD GOLD 8mm GRAFT;  Surgeon: Oris Krystal FALCON, MD;  Location: [facility] OR;  Service: Vascular;  Laterality: Right;   IR FLUORO GUIDE CV LINE RIGHT  12/28/2019   IR REMOVAL TUN CV CATH W/O FL  01/11/2020   IR US  GUIDE VASC ACCESS RIGHT  12/28/2019   OTHER SURGICAL HISTORY  POST RIGHT FEMOROPOPLITEAL BYPASS GRAFT   PERIPHERAL VASCULAR CATHETERIZATION  11/05/2014   Procedure: LOWER EXTREMITY ANGIOGRAPHY;  Surgeon: Lonni GORMAN Blade, MD;  Location: [facility] CATH LAB;  Service: Cardiovascular;;   VEIN HARVEST Left 04/24/2019   Procedure: LEFT LEG SAPHENOUS VEIN HARVEST;  Surgeon: Oris Krystal FALCON, MD;  Location: [facility] OR;  Service: Vascular;  Laterality: Left;    No Known Allergies  Prior to Admission medications   Medication Sig Start Date End Date Taking? Authorizing  Provider  Accu-Chek FastClix Lancets MISC Inject 102 each into the skin 3 (three) times daily. 06/23/24   Lafe Domino, DO  ACCU-CHEK GUIDE TEST test strip 1 each by Other route 3 (three) times daily. 04/19/24   [provider]  acetaminophen  (TYLENOL ) 325 MG tablet Take 2 tablets (650 mg total) by mouth every 4 (four) hours as needed for mild pain (pain score 1-3). 09/27/23   Love, Sharlet GORMAN, PA-C  albuterol  (PROVENTIL ) (2.5 MG/3ML) 0.083% nebulizer solution Take 3 mLs (2.5 mg total) by nebulization every 4 (four) hours as needed for wheezing or shortness of breath. 12/08/23   Laurence Locus, DO  amLODipine  (NORVASC ) 5 MG tablet Take 5 mg by mouth at bedtime.    [provider]  apixaban  (ELIQUIS ) 5 MG TABS tablet Take 1 tablet (5 mg total) by mouth 2 (two) times daily. 06/23/24   [place], Peter M, MD  carvedilol  (COREG ) 6.25 MG tablet Take 1 tablet (6.25 mg total) by mouth 2 (two) times daily with a meal. 06/23/24   [place], Peter M, MD  DROPLET PEN NEEDLES 32G X 4 MM MISC USE AS DIRECTED WITH INSULIN  01/03/24   Christia Budds, MD  Fluticasone -Umeclidin-Vilant (TRELEGY ELLIPTA ) 200-62.5-25 MCG/ACT AEPB INHALE 1 PUFF EVERY DAY Patient taking differently: Inhale 1 puff into the lungs in the morning. 11/15/23   Jude Harden GAILS, MD  gabapentin  (NEURONTIN ) 300 MG capsule Take 1 capsule (300 mg total) by mouth daily. In the morning or afternoon 06/21/24   Romelle Booty, MD  gabapentin  (NEURONTIN ) 600 MG tablet Take 1 tablet (600 mg total) by mouth at bedtime. 04/21/24   Christia Budds, MD  hydrALAZINE  (APRESOLINE ) 25 MG tablet Take 1 tablet (25 mg total) by mouth in the morning, at noon, and at bedtime. 06/16/24   Adele Song, MD  isosorbide  mononitrate (IMDUR ) 30 MG 24 hr tablet Take 1 tablet (30 mg total) by mouth daily. 06/23/24   [place], Peter M, MD  levETIRAcetam  (KEPPRA ) 500 MG tablet Take 1 tablet (500 mg total) by mouth 2 (two) times daily. 01/11/24   Christia Budds, MD  mirtazapine   (REMERON ) 7.5 MG tablet Take 1 tablet (7.5 mg total) by mouth at bedtime. 06/03/24   Adele Song, MD  MUCINEX  600 MG 12 hr tablet Take 600 mg by mouth 2 (two) times daily.    [provider]  Multiple Vitamins-Minerals (CENTRUM MEN) TABS Take 1 tablet by mouth daily with breakfast.    [provider]  NOVOLOG  FLEXPEN 100 UNIT/ML FlexPen Inject 0-10 Units into the skin See admin instructions. Inject 0-10 units into the skin three times a day before meals, per Sliding Scale: BGL 0-59 OR 400-1,000 = CALL MD; 60-150 = give nothing; 151-199 = 2 units; 200-249 = 4 units; 250-299 = 6 units; 300-349 = 8 units; 350-399 = 10 units    [provider]  OXYGEN  Inhale 4 L/min into the lungs continuous.    [provider]  pantoprazole  (PROTONIX ) 40 MG tablet Take 1 tablet (40  mg total) by mouth daily. 07/03/24   Adele Song, MD  polyethylene glycol (MIRALAX  / GLYCOLAX ) 17 g packet Take 17 g by mouth 2 (two) times daily as needed for mild constipation or moderate constipation.    [provider]  rosuvastatin  (CRESTOR ) 10 MG tablet Take 1 tablet (10 mg total) by mouth at bedtime. 06/23/24   [place], Peter M, MD  TRESIBA  FLEXTOUCH 100 UNIT/ML FlexTouch Pen Inject 5 Units into the skin in the morning.    [provider]    Social History   Socioeconomic History   Marital status: Married    Spouse name: Not on file   Number of children: 1   Years of education: Not on file   Highest education level: Not on file  Occupational History   Occupation: retired    Associate Professor: [employer]  Tobacco Use   Smoking status: Former    Current packs/day: 0.00    Average packs/day: 1 pack/day for 40.0 years (40.0 ttl pk-yrs)    Types: Cigarettes    Start date: 10/15/1978    Quit date: 10/15/2018    Years since quitting: 5.7    Passive exposure: Never   Smokeless tobacco: Never   Tobacco comments:    quit 2010 and quit again 2019  Vaping Use   Vaping status: Never  Used  Substance and Sexual Activity   Alcohol use: No    Alcohol/week: 0.0 standard drinks of alcohol   Drug use: No   Sexual activity: Not on file  Other Topics Concern   Not on file  Social History Narrative   Not on file   Social Drivers of Health   Financial Resource Strain: Low Risk  (03/05/2023)   Overall Financial Resource Strain (CARDIA)    Difficulty of Paying Living Expenses: Not hard at all  Food Insecurity: Patient Declined (04/18/2024)   Hunger Vital Sign    Worried About Running Out of Food in the Last Year: Patient declined    Ran Out of Food in the Last Year: Patient declined  Transportation Needs: Unmet Transportation Needs (04/18/2024)   PRAPARE - Administrator, Civil Service (Medical): Yes    Lack of Transportation (Non-Medical): Yes  Physical Activity: Inactive (04/18/2024)   Exercise Vital Sign    Days of Exercise per Week: 0 days    Minutes of Exercise per Session: Not on file  Stress: No Stress Concern Present (04/18/2024)   [employer] of Occupational Health - Occupational Stress Questionnaire    Feeling of Stress: Not at all  Social Connections: Unknown (04/18/2024)   Social Connection and Isolation Panel    Frequency of Communication with Friends and Family: Patient declined    Frequency of Social Gatherings with Friends and Family: Patient declined    Attends Religious Services: Patient declined    Database administrator or Organizations: No    Attends Engineer, structural: Not on file    Marital Status: Married  Recent Concern: Social Connections - Moderately Isolated (03/07/2024)   Social Connection and Isolation Panel    Frequency of Communication with Friends and Family: Once a week    Frequency of Social Gatherings with Friends and Family: Once a week    Attends Religious Services: More than 4 times per year    Active Member of [employer] or Organizations: No    Attends Banker Meetings: Never    Marital Status:  Married  Catering manager Violence: Not At Risk (03/07/2024)  Humiliation, Afraid, Rape, and Kick questionnaire    Fear of Current or Ex-Partner: No    Emotionally Abused: No    Physically Abused: No    Sexually Abused: No     Family History  Problem Relation Age of Onset   Diabetes Mother    Hyperlipidemia Sister    Hypertension Sister     ROS: [x]  Positive   [ ]  Negative   [ ]  All sytems reviewed and are negative  Cardiovascular: []  chest pain/pressure []  palpitations []  SOB lying flat []  DOE []  pain in legs while walking []  pain in legs at rest []  pain in legs at night []  non-healing ulcers []  hx of DVT []  swelling in legs  Pulmonary: []  productive cough []  asthma/wheezing []  home O2  Neurologic: []  weakness in []  arms []  legs []  numbness in []  arms []  legs []  hx of CVA []  mini stroke [] difficulty speaking or slurred speech []  temporary loss of vision in one eye []  dizziness  Hematologic: []  hx of cancer []  bleeding problems []  problems with blood clotting easily  Endocrine:   []  diabetes []  thyroid disease  GI []  vomiting blood []  blood in stool  GU: []  CKD/renal failure []  HD--[]  M/W/F or []  T/T/S []  burning with urination []  blood in urine  Psychiatric: []  anxiety []  depression  Musculoskeletal: []  arthritis []  joint pain  Integumentary: []  rashes []  ulcers  Constitutional: []  fever []  chills   Physical Examination  Vitals:   07/10/24 1848 07/10/24 2000  BP: (!) 186/89 (!) 163/73  Pulse: 70 74  Resp: 17 18  Temp: 98.4 F (36.9 C)   SpO2: 95% 95%   Body mass index is 21.22 kg/m.  General:  WDWN in NAD Gait: Not observed HENT: WNL, normocephalic Pulmonary: normal non-labored breathing, without Rales, rhonchi,  wheezing Cardiac: regular, without  Murmurs, rubs or gallops Vascular Exam/Pulses: Right groin palpable mass Left groin palpable femoral pulse No lower extremity tissue loss Extremities: without ischemic  changes Musculoskeletal: no muscle wasting or atrophy  Neurologic: A&O X 3; Appropriate Affect ; SENSATION: normal; MOTOR FUNCTION:  moving all extremities equally. Speech is fluent/normal    CBC    Component Value Date/Time   WBC 7.3 07/10/2024 1650   RBC 3.64 (L) 07/10/2024 1650   HGB 10.7 (L) 07/10/2024 1650   HGB 14.8 02/12/2022 0943   HCT 33.2 (L) 07/10/2024 1650   HCT 45.0 02/12/2022 0943   PLT 197 07/10/2024 1650   PLT 174 02/12/2022 0943   MCV 91.2 07/10/2024 1650   MCV 95 02/12/2022 0943   MCH 29.4 07/10/2024 1650   MCHC 32.2 07/10/2024 1650   RDW 17.9 (H) 07/10/2024 1650   RDW 15.0 02/12/2022 0943   LYMPHSABS 1.4 07/10/2024 1650   LYMPHSABS 1.5 02/12/2022 0943   MONOABS 0.8 07/10/2024 1650   EOSABS 0.2 07/10/2024 1650   EOSABS 0.1 02/12/2022 0943   BASOSABS 0.0 07/10/2024 1650   BASOSABS 0.0 02/12/2022 0943    BMET    Component Value Date/Time   NA 142 07/10/2024 1650   NA 145 (H) 02/03/2023 0927   K 4.0 07/10/2024 1650   CL 107 07/10/2024 1650   CO2 25 07/10/2024 1650   GLUCOSE 254 (H) 07/10/2024 1650   BUN 26 (H) 07/10/2024 1650   BUN 22 02/03/2023 0927   CREATININE 1.40 (H) 07/10/2024 1650   CREATININE 0.99 11/12/2014 1344   CALCIUM  8.9 07/10/2024 1650   GFRNONAA 53 (L) 07/10/2024 1650   GFRAA 46 (  L) 02/23/2020 0421    COAGS: Lab Results  Component Value Date   INR 1.5 (H) 07/10/2024   INR 1.6 (H) 03/17/2024   INR 1.5 (H) 01/06/2024     Non-Invasive Vascular Imaging:     CTA reviewed from today with aneurysmal degeneration of right groin at prior aortobifemoral bypass limb also pseudoaneurysm of the proximal anastomosis to the aorta  ASSESSMENT/PLAN: This is a 73 y.o. 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  He had an aortobifem 12/18/2014 by Dr. Eliza.  He then had a right femoral  pseudoaneurysm repair with interposition graft and a right femoral to peroneal bypass with vein by Dr. Oris on 04/24/2019.  Has been followed most recently by Dr. 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  Discussed reasons for admission at this time would be pain or if the skin were threatened.  Skin is intact as pictured above.  He denies pain on my evaluation.  He already has a scheduled visit with Dr. Lanis on Thursday in the office and can discuss elective repair at that time.  Discussed open surgery would be the only way to address his right femoral pseudoaneurysm.  Lonni DOROTHA Gaskins, MD [facility] Office: [phone]  [name]

## 2024-07-10 NOTE — Discharge Instructions (Signed)
 We have vascular surgery see you.  They have cleared you for discharge.  Please follow-up with your vascular surgeon on Thursday.  Is very important that you do go to this clinic meeting on Thursday.  If you have any kind of worsening pain or if gets a lot bigger fast, please come back to the ED for further evaluation.

## 2024-07-10 NOTE — ED Notes (Signed)
 Wife Hermann Dottavio [phone] would like an update asap

## 2024-07-11 DIAGNOSIS — R531 Weakness: Secondary | ICD-10-CM | POA: Diagnosis not present

## 2024-07-11 DIAGNOSIS — Z743 Need for continuous supervision: Secondary | ICD-10-CM | POA: Diagnosis not present

## 2024-07-11 DIAGNOSIS — I10 Essential (primary) hypertension: Secondary | ICD-10-CM | POA: Diagnosis not present

## 2024-07-11 NOTE — Progress Notes (Signed)
 Pharmacy Medication Assistance Program Note    07/11/2024  Patient ID: Kyle [name], male   DOB: Mar 29, 1951, 73 y.o.   MRN: [id number]     06/30/2024  Outreach Medication One  Manufacturer Medication One Novo Nordisk  [facility] Novolog   Dose of Novolog  SLIDING SCALE: 2-10 UNITS THREE TIMES DAILY (30U MAX)  Type of Assistance Manufacturer Assistance  Date Application Sent to Prescriber 07/04/2024  Name of Prescriber OTTO FAIRLY  Date Application Received From Provider 07/11/2024  Date Application Submitted to Manufacturer 06/30/2024  Method Application Sent to Manufacturer Online        07/11/2024  Patient ID: Kyle [name], male  DOB: 08/07/1951, 73 y.o.  MRN:  [id number]     06/30/2024  Outreach Medication Two  Manufacturer Medication Two Novo Nordisk  [facility] Tresiba   Dose of Tresiba  5 UNITS QD  Type of Radiographer, therapeutic Assistance  Date Application Sent to Prescriber 07/04/2024  Name of Prescriber OTTO FAIRLY  Date Application Received From Provider 07/11/2024  Method Application Sent to Manufacturer Online  Date Application Submitted to Manufacturer 06/30/2024    Provider pages signed and faxed to novo nordisk.

## 2024-07-11 NOTE — Telephone Encounter (Signed)
 Per Dr. Koval, they are required to write the sliding scale order differently from how the patient takes it on the medication assistant script.  Per Dr. Koval, the patient and his wife are aware of his accurate insulin  dosing of Novolog  sliding scale 0-10 units TID before meals, with the patient taking 2-4 units on average, and Tresiba  (insulin  degludec). The patient would be closely followed for insulin  management by the PCP and Dr. Koval. Script signed per pharmacy request.

## 2024-07-11 NOTE — Progress Notes (Unsigned)
 Office Note   History of Present Illness   Kyle [name] is a 73 y.o. (1951/05/21) male who presents for surveillance with history of aortobifemoral bypass graft in 2016.  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  In 2020 he had a right femoral artery pseudoaneurysm repair with right femoral to peroneal artery bypass with greater saphenous vein.  During surveillance in 2022 he was found to have elevated velocities of 437cm/s at the distal anastomosis of his femoral-peroneal bypass.  This was investigated via angiogram but no significant stenosis was found.  It was suggested if his velocities ever significantly increased he may require repeat angiogram versus surgical revision.  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  He did not want to move forward with invasive surgery, electing to hopefully recover from his stroke.  He was seen in the outpatient setting, and had similar sentiments. Most recently, he was seen in the ED with new onset pain in the right groin.  The pseudoaneurysm was found to have enlarged from previous size.  He was sent to my office for follow-up.  On exam, Kyle [name] was accompanied by his wife.  He uses a motorized wheelchair at baseline with continued severe right sided deficits.  He is not ambulatory, but is determined to walk again.  Of note, he requires 4 L nasal cannula oxygen  at baseline.  Denies symptoms of ischemic rest pain, tissue loss in the feet  Current Outpatient Medications  Medication Sig Dispense Refill   Accu-Chek FastClix Lancets MISC Inject 102 each into the skin 3 (three) times daily. 1 each 6   ACCU-CHEK GUIDE TEST test strip 1 each by Other route 3 (three) times daily.     acetaminophen  (TYLENOL ) 325 MG tablet Take 2 tablets (650 mg total) by mouth every 4 (four) hours as needed for mild pain (pain  score 1-3).     albuterol  (PROVENTIL ) (2.5 MG/3ML) 0.083% nebulizer solution Take 3 mLs (2.5 mg total) by nebulization every 4 (four) hours as needed for wheezing or shortness of breath.     amLODipine  (NORVASC ) 5 MG tablet Take 5 mg by mouth at bedtime.     apixaban  (ELIQUIS ) 5 MG TABS tablet Take 1 tablet (5 mg total) by mouth 2 (two) times daily. 180 tablet 3   carvedilol  (COREG ) 6.25 MG tablet Take 1 tablet (6.25 mg total) by mouth 2 (two) times daily with a meal. 180 tablet 3   DROPLET PEN NEEDLES 32G X 4 MM MISC USE AS DIRECTED WITH INSULIN  300 each 3   Fluticasone -Umeclidin-Vilant (TRELEGY ELLIPTA ) 200-62.5-25 MCG/ACT AEPB INHALE 1 PUFF EVERY DAY (Patient taking differently: Inhale 1 puff into the lungs in the morning.) 180 each 0   gabapentin  (NEURONTIN ) 300 MG capsule Take 1 capsule (300 mg total) by mouth daily. In the morning or afternoon 30 capsule 3   gabapentin  (NEURONTIN ) 600 MG tablet Take 1 tablet (600 mg total) by mouth at bedtime. 90 tablet 0   hydrALAZINE  (APRESOLINE ) 25 MG tablet Take 1 tablet (25 mg total) by mouth in the morning, at noon, and at bedtime. 270 tablet 0   isosorbide  mononitrate (IMDUR ) 30 MG 24 hr tablet Take 1 tablet (30 mg total) by mouth daily. 90 tablet 3   levETIRAcetam  (KEPPRA ) 500 MG tablet Take 1 tablet (500 mg total) by mouth 2 (two) times  daily. 180 tablet 0   mirtazapine  (REMERON ) 7.5 MG tablet Take 1 tablet (7.5 mg total) by mouth at bedtime. 90 tablet 3   MUCINEX  600 MG 12 hr tablet Take 600 mg by mouth 2 (two) times daily.     Multiple Vitamins-Minerals (CENTRUM MEN) TABS Take 1 tablet by mouth daily with breakfast.     NOVOLOG  FLEXPEN 100 UNIT/ML FlexPen Inject 0-10 Units into the skin See admin instructions. Inject 0-10 units into the skin three times a day before meals, per Sliding Scale: BGL 0-59 OR 400-1,000 = CALL MD; 60-150 = give nothing; 151-199 = 2 units; 200-249 = 4 units; 250-299 = 6 units; 300-349 = 8 units; 350-399 = 10 units     OXYGEN   Inhale 4 L/min into the lungs continuous.     pantoprazole  (PROTONIX ) 40 MG tablet Take 1 tablet (40 mg total) by mouth daily. 90 tablet 0   polyethylene glycol (MIRALAX  / GLYCOLAX ) 17 g packet Take 17 g by mouth 2 (two) times daily as needed for mild constipation or moderate constipation.     rosuvastatin  (CRESTOR ) 10 MG tablet Take 1 tablet (10 mg total) by mouth at bedtime. 90 tablet 3   TRESIBA  FLEXTOUCH 100 UNIT/ML FlexTouch Pen Inject 5 Units into the skin in the morning.     No current facility-administered medications for this visit.    REVIEW OF SYSTEMS (negative unless checked):   Cardiac:  []  Chest pain or chest pressure? []  Shortness of breath upon activity? []  Shortness of breath when lying flat? []  Irregular heart rhythm?  Vascular:  []  Pain in calf, thigh, or hip brought on by walking? []  Pain in feet at night that wakes you up from your sleep? []  Blood clot in your veins? []  Leg swelling?  Pulmonary:  []  Oxygen  at home? []  Productive cough? []  Wheezing?  Neurologic:  []  Sudden weakness in arms or legs? []  Sudden numbness in arms or legs? []  Sudden onset of difficult speaking or slurred speech? []  Temporary loss of vision in one eye? []  Problems with dizziness?  Gastrointestinal:  []  Blood in stool? []  Vomited blood?  Genitourinary:  []  Burning when urinating? []  Blood in urine?  Psychiatric:  []  Major depression  Hematologic:  []  Bleeding problems? []  Problems with blood clotting?  Dermatologic:  []  Rashes or ulcers?  Constitutional:  []  Fever or chills?  Ear/Nose/Throat:  []  Change in hearing? []  Nose bleeds? []  Sore throat?  Musculoskeletal:  []  Back pain? []  Joint pain? []  Muscle pain?   Physical Examination   There were no vitals filed for this visit.  There is no height or weight on file to calculate BMI.  General: Severe decline since last seen, bedridden Gait: Not observed HENT: WNL, normocephalic Pulmonary: normal  non-labored breathing , without rales, rhonchi,  wheezing Cardiac: regular Abdomen: soft, NT, no masses Skin: without rashes Vascular Exam/Pulses: Multiphasic peroneal signal, multiphasic DP signal Pedal pulse in the right groin, no significant size change on physical exam Extremities: without ischemic changes, without gangrene , without cellulitis; without open wounds;  Musculoskeletal: no muscle wasting or atrophy  Neurologic: Alert and oriented, dysphagia, severe deficits in the right arm and right leg.  Severe neuropathic pain in both. Psychiatric:  The pt has Normal affect.     Medical Decision Making   Kyle [name] is a 73 y.o. 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    The aneurysm has  increased in size. Regarding Kyle [name]'s overall clinical status, little has changed.  He is still nonambulatory with severe right sided deficits, and has a 4 L oxygen  requirement at baseline.  I had a very long discussion with Kyle [name] and his wife regarding his right groin pseudoaneurysm.  There is no question that he is a very poor surgical candidate due to his comorbidities, with my major concern being pulmonary insufficiency.  He is aware that revision of the right groin will require interposition bypass, muscle flap.  He is aware that this could cause bypass thrombosis.  I am concerned that postoperatively, he can have severe respiratory compromise and we discussed if he would be amenable to tracheostomy if 1 were needed.  He stated he would not want a tracheostomy which upset his wife significantly.  We also discussed that if his bypass occludes, he would likely require lower extremity amputation down the road in the form of above-knee amputation.  Again, both he and his wife are very upset having to listen to these risks.  After a significant amount of discussion, Kyle [name] elected to proceed with surgery.  I think that  unfortunately it is necessary as continued pseudoaneurysm growth will put him in a life-threatening situation.  I have discussed his case with my partner Dr. Gretta, who has time on Monday.  He has graciously offered to help. I have also blocked my schedule as well.  After discussing the risks and benefits of right groin reconstruction, muscle flap for pseudoaneurysm, Kyle [name] elected to proceed.

## 2024-07-13 ENCOUNTER — Encounter (HOSPITAL_COMMUNITY): Payer: Self-pay

## 2024-07-13 ENCOUNTER — Other Ambulatory Visit: Payer: Self-pay

## 2024-07-13 ENCOUNTER — Ambulatory Visit (HOSPITAL_COMMUNITY): Admission: RE | Admit: 2024-07-13 | Source: Ambulatory Visit

## 2024-07-13 ENCOUNTER — Ambulatory Visit (HOSPITAL_BASED_OUTPATIENT_CLINIC_OR_DEPARTMENT_OTHER)
Admission: RE | Admit: 2024-07-13 | Discharge: 2024-07-13 | Discharge status: Home / Self Care | Source: Ambulatory Visit | Attending: Vascular Surgery

## 2024-07-13 ENCOUNTER — Ambulatory Visit (INDEPENDENT_AMBULATORY_CARE_PROVIDER_SITE_OTHER): Admitting: Vascular Surgery

## 2024-07-13 ENCOUNTER — Encounter: Payer: Self-pay | Admitting: Vascular Surgery

## 2024-07-13 ENCOUNTER — Ambulatory Visit (HOSPITAL_COMMUNITY)
Admission: RE | Admit: 2024-07-13 | Discharge: 2024-07-13 | Disposition: A | Discharge status: Home / Self Care | Source: Ambulatory Visit | Attending: Vascular Surgery | Admitting: Vascular Surgery

## 2024-07-13 VITALS — BP 154/75 | HR 87 | Temp 98.2°F | Resp 20 | Ht 71.0 in | Wt 152.0 lb

## 2024-07-13 DIAGNOSIS — Z9889 Other specified postprocedural states: Secondary | ICD-10-CM | POA: Diagnosis not present

## 2024-07-13 DIAGNOSIS — I724 Aneurysm of artery of lower extremity: Secondary | ICD-10-CM

## 2024-07-13 DIAGNOSIS — I739 Peripheral vascular disease, unspecified: Secondary | ICD-10-CM

## 2024-07-13 DIAGNOSIS — Z95828 Presence of other vascular implants and grafts: Secondary | ICD-10-CM

## 2024-07-13 LAB — VAS US ABI WITH/WO TBI
Left ABI: 0.48
Right ABI: 0.7

## 2024-07-14 ENCOUNTER — Other Ambulatory Visit: Payer: Self-pay

## 2024-07-14 ENCOUNTER — Encounter (HOSPITAL_COMMUNITY): Payer: Self-pay | Admitting: Vascular Surgery

## 2024-07-14 DIAGNOSIS — J9612 Chronic respiratory failure with hypercapnia: Secondary | ICD-10-CM | POA: Diagnosis not present

## 2024-07-14 DIAGNOSIS — T22221D Burn of second degree of right elbow, subsequent encounter: Secondary | ICD-10-CM | POA: Diagnosis not present

## 2024-07-14 DIAGNOSIS — E1151 Type 2 diabetes mellitus with diabetic peripheral angiopathy without gangrene: Secondary | ICD-10-CM | POA: Diagnosis not present

## 2024-07-14 DIAGNOSIS — I69322 Dysarthria following cerebral infarction: Secondary | ICD-10-CM | POA: Diagnosis not present

## 2024-07-14 DIAGNOSIS — I130 Hypertensive heart and chronic kidney disease with heart failure and stage 1 through stage 4 chronic kidney disease, or unspecified chronic kidney disease: Secondary | ICD-10-CM | POA: Diagnosis not present

## 2024-07-14 DIAGNOSIS — J449 Chronic obstructive pulmonary disease, unspecified: Secondary | ICD-10-CM | POA: Diagnosis not present

## 2024-07-14 DIAGNOSIS — I69351 Hemiplegia and hemiparesis following cerebral infarction affecting right dominant side: Secondary | ICD-10-CM | POA: Diagnosis not present

## 2024-07-14 DIAGNOSIS — I69392 Facial weakness following cerebral infarction: Secondary | ICD-10-CM | POA: Diagnosis not present

## 2024-07-14 DIAGNOSIS — J9611 Chronic respiratory failure with hypoxia: Secondary | ICD-10-CM | POA: Diagnosis not present

## 2024-07-14 NOTE — Progress Notes (Addendum)
 PCP - Adele Song, MD  Cardiologist - [place], Peter M, MD   PPM/ICD - denies Device Orders - n/a Rep Notified - n/a  Chest x-ray -  EKG - 03-17-24 Stress Test -  ECHO - 11-21-18 Cardiac Cath - 10-27-2007  CPAP - denies    Fasting Blood Sugar - per wife blood sugar ranges around 150 in the am Checks Blood Sugar TID  Blood Thinner Instructions: apixaban  (ELIQUIS  ) Last dose 07-13-24 Aspirin  Instructions: denies  ERAS Protcol - NPO  COVID TEST- n/a  Anesthesia review: yes Hc of CHF, HTN, COPD, DM, stroke. Patient does wear 02 at 4l via [place] at all times  Patient verbally denies any shortness of breath, fever, cough and chest pain during phone call   -------------  SDW INSTRUCTIONS given:  Your procedure is scheduled on [facility] July 17, 2024.  Report to [facility] Main Entrance A at 8:30 A.M., and check in at the Admitting office.  Call this number if you have problems the morning of surgery:  [phone]   Remember:  Do not eat or drink after midnight the night before your surgery    Take these medicines the morning of surgery with A SIP OF WATER   acetaminophen  (TYLENOL )  albuterol  (PROVENTIL )  nebulizer solution  carvedilol  (COREG )  TRELEGY ELLIPTA   gabapentin  (NEURONTIN )  hydrALAZINE  (APRESOLINE )  isosorbide  mononitrate (IMDUR )  levETIRAcetam  (KEPPRA )  MUCINEX    As of today, STOP taking any Aspirin  (unless otherwise instructed by your surgeon) Aleve, Naproxen, Ibuprofen, Motrin, Advil, Goody's, BC's, all herbal medications, fish oil, and all vitamins.          WHAT DO I DO ABOUT MY DIABETES MEDICATION?   Do not take oral diabetes medicines (pills) the morning of surgery.  THE NIGHT BEFORE SURGERY, take 0 units of NOVOLOG  insulin .     THE MORNING OF SURGERY take only half of correction dose if blood sugar greater than 220  THE MORNING OF SURGERY, take 2 units of TRESIBA  insulin .  The day of surgery, do not take other diabetes injectables, including  Byetta (exenatide), Bydureon (exenatide ER), Victoza (liraglutide), or Trulicity (dulaglutide).  If your CBG is greater than 220 mg/dL, you may take  of your sliding scale (correction) dose of insulin .   HOW TO MANAGE YOUR DIABETES BEFORE AND AFTER SURGERY  Why is it important to control my blood sugar before and after surgery? Improving blood sugar levels before and after surgery helps healing and can limit problems. A way of improving blood sugar control is eating a healthy diet by:  Eating less sugar and carbohydrates  Increasing activity/exercise  Talking with your doctor about reaching your blood sugar goals High blood sugars (greater than 180 mg/dL) can raise your risk of infections and slow your recovery, so you will need to focus on controlling your diabetes during the weeks before surgery. Make sure that the doctor who takes care of your diabetes knows about your planned surgery including the date and location.  How do I manage my blood sugar before surgery? Check your blood sugar at least 4 times a day, starting 2 days before surgery, to make sure that the level is not too high or low.  Check your blood sugar the morning of your surgery when you wake up and every 2 hours until you get to the Short Stay unit.  If your blood sugar is less than 70 mg/dL, you will need to treat for low blood sugar: Do not take insulin . Treat a low blood  sugar (less than 70 mg/dL) with  cup of clear juice (cranberry or apple), 4 glucose tablets, OR glucose gel. Recheck blood sugar in 15 minutes after treatment (to make sure it is greater than 70 mg/dL). If your blood sugar is not greater than 70 mg/dL on recheck, call [phone] for further instructions. Report your blood sugar to the short stay nurse when you get to Short Stay.  If you are admitted to the hospital after surgery: Your blood sugar will be checked by the staff and you will probably be given insulin  after surgery (instead of oral  diabetes medicines) to make sure you have good blood sugar levels. The goal for blood sugar control after surgery is 80-180 mg/dL.             Do not wear jewelry, make up, or nail polish            Do not wear lotions, powders, perfumes/colognes, or deodorant.            Do not shave 48 hours prior to surgery.  Men may shave face and neck.            Do not bring valuables to the hospital.            [facility] is not responsible for any belongings or valuables.  Do NOT Smoke (Tobacco/Vaping) 24 hours prior to your procedure If you use a CPAP at night, you may bring all equipment for your overnight stay.   Contacts, glasses, dentures or bridgework may not be worn into surgery.      For patients admitted to the hospital, discharge time will be determined by your treatment team.   Patients discharged the day of surgery will not be allowed to drive home, and someone needs to stay with them for 24 hours.    Special instructions:   [place]- Preparing For Surgery  Before surgery, you can play an important role. Because skin is not sterile, your skin needs to be as free of germs as possible. You can reduce the number of germs on your skin by washing with CHG (chlorahexidine gluconate) Soap before surgery.  CHG is an antiseptic cleaner which kills germs and bonds with the skin to continue killing germs even after washing.    Oral Hygiene is also important to reduce your risk of infection.  Remember - BRUSH YOUR TEETH THE MORNING OF SURGERY WITH YOUR REGULAR TOOTHPASTE  Please do not use if you have an allergy to CHG or antibacterial soaps. If your skin becomes reddened/irritated stop using the CHG.  Do not shave (including legs and underarms) for at least 48 hours prior to first CHG shower. It is OK to shave your face.  Please follow these instructions carefully.   Shower the NIGHT BEFORE SURGERY and the MORNING OF SURGERY with DIAL Soap.   Pat yourself dry with a CLEAN TOWEL.  Wear  CLEAN PAJAMAS to bed the night before surgery  Place CLEAN SHEETS on your bed the night of your first shower and DO NOT SLEEP WITH PETS.   Day of Surgery: Please shower morning of surgery  Wear Clean/Comfortable clothing the morning of surgery Do not apply any deodorants/lotions.   Remember to brush your teeth WITH YOUR REGULAR TOOTHPASTE.   Questions were answered. Patient verbalized understanding of instructions.

## 2024-07-14 NOTE — Progress Notes (Signed)
 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  History includes former smoker (quit 10/15/2018), COPD (4-5 L O2), HTN, CHF (systolic HF EF 20-25% 02/2011, normal coronaries; EF 55-60% 08/2023), non-ischemic dilated cardiomyopathy, aortic stenosis (severe 08/2023), pulmonary hypertension (moderate RHC 2009; normal PASP, RVSP 35.6 mmHg 08/2023 TTE), PAF (in setting of sepsis), hemorrhagic stroke (08/2023; residual right hemiplegia), PAD (right FemPopBG 02/20/2009, chronically occluded; AFBG 12/18/2014; repair of right femoral pseudoaneurysm & right FemPeronealBG 04/24/2019), edema,   He has had known right femoral pseudoaneurysm. It had measured 3.4 x 3.0 cm in 09/2023. He was s/p ICH in 08/2023 with significant right sided deficits/non-ambulatory and underlying COPD with 4L home O2 and was not felt to be a good candidate for pseudoaneurysm repair at that time. Patient also wanted to defer. Since then, he presented to the ED on 07/10/2024 with right groin pain. CTA done and showed an enlarging right femoral pseudoaneurysm to 3.2 x 3.6 x 4.2 cm. Vascular surgeon was consulted and set up for close out-patient follow-up with his primary vascular surgeon Dr. Lanis to discuss repair. Per 07/13/2024 note: I had a very long discussion with Terrez and his wife regarding his right groin pseudoaneurysm.  There is no question that he is a very poor surgical candidate due to his comorbidities, with my major concern being  pulmonary insufficiency.   He is aware that revision of the right groin will require interposition bypass, muscle flap.  He is aware that this could cause bypass thrombosis.  I am concerned that postoperatively, he can have severe respiratory compromise and we discussed if he would be amenable to tracheostomy if 1 were needed.  He stated he would not want a tracheostomy which upset his wife significantly.  We also discussed that if his bypass occludes, he would likely require lower extremity amputation down the road in the form of above-knee amputation.  Again, both he and his wife are very upset having to listen to these risks.   After a significant amount of discussion, Ulrich elected to proceed with surgery.  I think that unfortunately it is necessary as continued pseudoaneurysm growth will put him in a life-threatening situation.  I have discussed his case with my partner Dr. Gretta, who has time on Monday.  He has graciously offered to help. I have also blocked my schedule as well.   In addition to his debilitating ICH, chronic hypoxic respiratory failure, and PAD, he has has known NICM, HFrecEF (55-60%, no RWMA 09/06/2023), severe AS (Dr. Swaziland suspected moderate AS) by 09/06/2023 TTE that showed tricuspid AV with severe calcification, mild AI, severe AS with mean gradient 36.0 mmHg,, AVA 0.95 cm2.  - As of recent cardiology follow-up with Dr. Peter [place], he noted, Biscuspid AV with suspect moderate AS. Murmur is not really impressive today. Poor candidate for TAVR - asymptomatic. On Coreg , hydralazine  for HF. No ARB/ACEi due to CKD. On Lipitor for PAD, and Eliquis  for PAF. Follow-up in 1 year  planned.  Last pulmonology follow-up was on 01/20/2024 with Orlie Milling, NP. Visit was virtual given difficulty leaving the house since his CVA. O2 sats going good up to 5L. On Trelgey. No increased albuterol  use. 4 month follow-up planned.   VVS advised last Eliquis  07/13/2024.   Reviewed complex  history and recent VVS and cardiology notes with anesthesiologist Peggye Nest, MD. He has an enlarging femoral pseudoaneurysm. Dr. Lanis discussed increased surgical risks of repair, and he has opted to proceed. Anesthesia team to evaluate on the day of surgery.   VS:  Wt Readings from Last 3 Encounters:  07/13/24 68.9 kg  07/10/24 69 kg  04/11/24 69 kg   BP Readings from Last 3 Encounters:  07/13/24 (!) 154/75  07/11/24 (!) 175/78  06/23/24 134/60   Pulse Readings from Last 3 Encounters:  07/13/24 87  07/11/24 93  06/23/24 78     PROVIDERS: Adele Song, MD [place], Peter, MD is cardiologist  Rosemarie Rothman, MD is neurologist Jude Donning, MD is pulmonologist  LABS: Most recent labs in [facility] include: Lab Results  Component Value Date   WBC 7.3 07/10/2024   HGB 10.7 (L) 07/10/2024   HCT 33.2 (L) 07/10/2024   PLT 197 07/10/2024   GLUCOSE 254 (H) 07/10/2024   CHOL 123 09/06/2023   TRIG 62 09/08/2023   HDL 40 (L) 09/06/2023   LDLCALC 61 09/06/2023   ALT 15 07/10/2024   AST 20 07/10/2024   NA 142 07/10/2024   K 4.0 07/10/2024   CL 107 07/10/2024   CREATININE 1.40 (H) 07/10/2024   BUN 26 (H) 07/10/2024   CO2 25 07/10/2024   INR 1.5 (H) 07/10/2024   HGBA1C 5.8 (H) 03/07/2024     IMAGES: CTA Ao+BiFem 07/10/2024: IMPRESSION: 1. 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 2. Pseudoaneurysm (3.2 x 3.6 x 4.2 cm) at the right femoral bypass anastomosis with moderate surrounding hemorrhage, increased in size and further recanalized since prior examination. 3. 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 Associated marked right renal cortical atrophy. 4. Aortobifemoral bypass grafting with widely patent graft. 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 5. Relatively slow filling of the right  superficial femoral artery Patency of the right lower extremity vasculature is not confirmed on this examination. 6. Single-vessel reconstituted flow to the left foot. 7. Moderate colonic and rectal stool burden without evidence of obstruction. Moderate descending and sigmoid colonic diverticulosis without evidence of diverticulitis.  MRI Brain 03/08/2024: IMPRESSION: 1.  No acute intracranial abnormality. 2. Chronic small vessel disease. 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   CTA Chest 01/05/2024: IMPRESSION: 1. No pulmonary embolus. 2. Mild cardiomegaly. Contrast refluxes into the hepatic veins and IVC suggesting right heart dysfunction. Small bilateral pleural effusions. 3. Ill-defined opacity in the lung bases, similar to prior CT, likely atelectasis or scarring. 4. Emphysema. 5. Aortic atherosclerosis and coronary artery calcifications. 6. Cholelithiasis.   EKG: 03/17/2024: Sinus rhythm Atrial premature complex Posterior infarct, old No significant change since last tracing Confirmed by Dean Clarity [phone]) on 03/17/2024 12:12:49 PM   CV: Echo 09/06/2023: IMPRESSIONS   1. Left ventricular ejection fraction, by estimation, is 55 to 60%. The  left ventricle has normal function. The left ventricle has no regional  wall motion abnormalities. There is moderate  concentric left ventricular  hypertrophy. Left ventricular  diastolic parameters are consistent with Grade I diastolic dysfunction  (impaired relaxation). The average left ventricular global longitudinal  strain is -15.5 %. The global longitudinal strain is abnormal.   2. Right ventricular systolic function is normal. The right ventricular  size is normal. There is normal pulmonary artery systolic pressure. The  estimated right ventricular systolic pressure is 35.6 mmHg.   3. Left atrial size  was mildly dilated.   4. Right atrial size was mildly dilated.   5. The mitral valve is normal in structure. No evidence of mitral valve  regurgitation. No evidence of mitral stenosis.   6. The aortic valve is tricuspid. There is severe calcifcation of the  aortic valve. Aortic valve regurgitation is mild. Severe aortic valve  stenosis. Aortic valve mean gradient measures 36.0 mmHg, AVA 0.95 cm^2.   7. The inferior vena cava is dilated in size with <50% respiratory  variability, suggesting right atrial pressure of 15 mmHg.    RHC/LHC 10/18/2008:  FINAL INTERPRETATION:  1. Severe global left ventricular dysfunction with an ejection      fraction of 20-25%.  2. Moderate aortic insufficiency.  3. Moderate pulmonary hypertension.  4. Normal coronary anatomy.  PLAN:  We would recommend continued aggressive medical therapy.  Past Medical History:  Diagnosis Date   Aortic insufficiency    MODERATE WITH A BICUSPID AORTIC VAVLE   Arterial occlusive disease    MULTILEVEL   Atrial fibrillation (HCC)    CHF (congestive heart failure) (HCC)    Chronic bronchitis (HCC)    CKD (chronic kidney disease) stage 3, GFR 30-59 ml/min (HCC)    COPD (chronic obstructive pulmonary disease) (HCC)    Dilated cardiomyopathy (HCC)    WITH EJECTION FRACTION DOWN TO 20-25%--WITH CONGESTIVE HEART FAILURE   Edema    LOWER EXTREMETIES   Emphysema of lung (HCC)    Hemorrhagic stroke (HCC) 09/06/2023   right side defecit   Hypertension    ICH (intracerebral hemorrhage) (HCC) 09/10/2023   Insulin  dependent type 2 diabetes mellitus (HCC)    Normal coronary arteries 2009   Orthopnea    Peripheral arterial disease (HCC)    Pulmonary hypertension (HCC)     Past Surgical History:  Procedure Laterality Date   ABDOMINAL AORTAGRAM N/A 11/05/2014   Procedure: ABDOMINAL EZELLA;  Surgeon: Lonni GORMAN Blade, MD;  Location: [facility] CATH LAB;  Service: Cardiovascular;  Laterality: N/A;   ABDOMINAL AORTOGRAM W/LOWER  EXTREMITY Bilateral 04/21/2019   Procedure: ABDOMINAL AORTOGRAM W/LOWER EXTREMITY;  Surgeon: Blade Lonni GORMAN, MD;  Location: [facility] INVASIVE CV LAB;  Service: Cardiovascular;  Laterality: Bilateral;   ABDOMINAL AORTOGRAM W/LOWER EXTREMITY Right 05/30/2021   Procedure: ABDOMINAL AORTOGRAM W/LOWER EXTREMITY;  Surgeon: Blade Lonni GORMAN, MD;  Location: [facility] INVASIVE CV LAB;  Service: Cardiovascular;  Laterality: Right;   AORTA - BILATERAL FEMORAL ARTERY BYPASS GRAFT N/A 12/18/2014   Procedure: AORTOBIFEMORAL BYPASS GRAFT;  Surgeon: Lonni GORMAN Blade, MD;  Location: [facility] OR;  Service: Vascular;  Laterality: N/A;   CARDIAC CATHETERIZATION  2009   Nl Cors, EF 20%   COLONOSCOPY     ENDARTERECTOMY FEMORAL Left 12/18/2014   Procedure: ENDARTERECTOMY FEMORAL;  Surgeon: Lonni GORMAN Blade, MD;  Location: [facility] OR;  Service: Vascular;  Laterality: Left;   FALSE ANEURYSM REPAIR Right 04/24/2019   Procedure: REPAIR OF RIGHT FEMORAL ARTERY PSEUDOANEURYSM;  Surgeon: Oris Krystal FALCON, MD;  Location: [facility] OR;  Service: Vascular;  Laterality: Right;   FEMORAL-POPLITEAL BYPASS GRAFT  02/21/2011   right   FEMORAL-TIBIAL BYPASS GRAFT Right 04/24/2019   Procedure: REDO BYPASS GRAFT RIGHT FEMORAL-TIBIAL ARTERY USING LEFT LEG VEIN & HEMASHIELD GOLD 8mm GRAFT;  Surgeon: Oris Krystal FALCON, MD;  Location: [facility] OR;  Service: Vascular;  Laterality: Right;   IR FLUORO GUIDE CV LINE RIGHT  12/28/2019   IR REMOVAL TUN CV CATH W/O FL  01/11/2020   IR US  GUIDE VASC ACCESS RIGHT  12/28/2019   OTHER SURGICAL HISTORY     POST RIGHT FEMOROPOPLITEAL BYPASS GRAFT   PERIPHERAL VASCULAR CATHETERIZATION  11/05/2014   Procedure: LOWER EXTREMITY ANGIOGRAPHY;  Surgeon: Lonni GORMAN Blade, MD;  Location: [facility] CATH LAB;  Service: Cardiovascular;;   VEIN HARVEST Left 04/24/2019   Procedure: LEFT LEG SAPHENOUS VEIN HARVEST;  Surgeon: Oris Krystal FALCON, MD;  Location: [facility] OR;  Service: Vascular;  Laterality: Left;    MEDICATIONS: No current  facility-administered medications for this encounter.    acetaminophen  (TYLENOL ) 325 MG tablet   albuterol  (PROVENTIL ) (2.5 MG/3ML) 0.083% nebulizer solution   amLODipine  (NORVASC ) 5 MG tablet   apixaban  (ELIQUIS ) 5 MG TABS tablet   carvedilol  (COREG ) 6.25 MG tablet   Fluticasone -Umeclidin-Vilant (TRELEGY ELLIPTA ) 200-62.5-25 MCG/ACT AEPB   gabapentin  (NEURONTIN ) 300 MG capsule   gabapentin  (NEURONTIN ) 600 MG tablet   hydrALAZINE  (APRESOLINE ) 25 MG tablet   isosorbide  mononitrate (IMDUR ) 30 MG 24 hr tablet   levETIRAcetam  (KEPPRA ) 500 MG tablet   mirtazapine  (REMERON ) 7.5 MG tablet   MUCINEX  600 MG 12 hr tablet   Multiple Vitamins-Minerals (CENTRUM MEN) TABS   NOVOLOG  FLEXPEN 100 UNIT/ML FlexPen   OXYGEN    pantoprazole  (PROTONIX ) 40 MG tablet   polyethylene glycol (MIRALAX  / GLYCOLAX ) 17 g packet   rosuvastatin  (CRESTOR ) 10 MG tablet   TRESIBA  FLEXTOUCH 100 UNIT/ML FlexTouch Pen   Accu-Chek FastClix Lancets MISC   ACCU-CHEK GUIDE TEST test strip   DROPLET PEN NEEDLES 32G X 4 MM MISC    Isaiah Ruder, PA-C Surgical Short Stay/Anesthesiology [facility] Phone [phone] [facility] Phone [phone] 07/14/2024 4:33 PM

## 2024-07-14 NOTE — Anesthesia Preprocedure Evaluation (Addendum)
 Anesthesia Evaluation  Patient identified by MRN, date of birth, ID band Patient awake    Reviewed: Allergy & Precautions, NPO status , Patient's Chart, lab work & pertinent test results  Airway Mallampati: II  TM Distance: >3 FB Neck ROM: Full    Dental  (+) Edentulous Upper, Edentulous Lower   Pulmonary COPD, former smoker   + rhonchi        Cardiovascular Exercise Tolerance: Poor hypertension, Pt. on home beta blockers and Pt. on medications + Peripheral Vascular Disease, +CHF and + Orthopnea  + Valvular Problems/Murmurs AI and AS  Rhythm:Regular Rate:Normal + Systolic murmurs Echo 09/06/2023: IMPRESSIONS  1. Left ventricular ejection fraction, by estimation, is 55 to 60%. The  left ventricle has normal function. The left ventricle has no regional  wall motion abnormalities. There is moderate concentric left ventricular  hypertrophy. Left ventricular  diastolic parameters are consistent with Grade I diastolic dysfunction  (impaired relaxation). The average left ventricular global longitudinal  strain is -15.5 %. The global longitudinal strain is abnormal.  2. Right ventricular systolic function is normal. The right ventricular  size is normal. There is normal pulmonary artery systolic pressure. The  estimated right ventricular systolic pressure is 35.6 mmHg.  3. Left atrial size was mildly dilated.  4. Right atrial size was mildly dilated.  5. The mitral valve is normal in structure. No evidence of mitral valve  regurgitation. No evidence of mitral stenosis.  6. The aortic valve is tricuspid. There is severe calcifcation of the  aortic valve. Aortic valve regurgitation is mild. Severe aortic valve  stenosis. Aortic valve mean gradient measures 36.0 mmHg, AVA 0.95 cm^2.  7. The inferior vena cava is dilated in size with <50% respiratory  variability, suggesting right atrial pressure of 15 mmHg.       Neuro/Psych CVA  negative psych ROS   GI/Hepatic negative GI ROS, Neg liver ROS,,,  Endo/Other  diabetes    Renal/GU CRFRenal disease     Musculoskeletal negative musculoskeletal ROS (+)    Abdominal   Peds  Hematology negative hematology ROS (+)   Anesthesia Other Findings   Reproductive/Obstetrics                              Lab Results  Component Value Date   WBC 7.3 07/10/2024   HGB 10.7 (L) 07/10/2024   HCT 33.2 (L) 07/10/2024   MCV 91.2 07/10/2024   PLT 197 07/10/2024   Lab Results  Component Value Date   CREATININE 1.40 (H) 07/10/2024   BUN 26 (H) 07/10/2024   NA 142 07/10/2024   K 4.0 07/10/2024   CL 107 07/10/2024   CO2 25 07/10/2024   Echo: - Left ventricle: The cavity size was normal. Systolic function was   mildly to moderately reduced. The estimated ejection fraction was   in the range of 40% to 45%. Diffuse hypokinesis. The study is not   technically sufficient to allow evaluation of LV diastolic   function. - Ventricular septum: The contour showed diastolic flattening. - Aortic valve: Valve mobility was restricted. There was mild   stenosis. There was moderate regurgitation. - Right ventricle: The cavity size was moderately dilated. Wall   thickness was normal. Systolic function was moderately reduced. - Right atrium: The atrium was severely dilated. - Tricuspid valve: There was moderate regurgitation. - Pulmonary arteries: Systolic pressure was moderately increased.   PA peak pressure: 53 mm Hg (S).  Anesthesia  Physical Anesthesia Plan  ASA: 4  Anesthesia Plan: General   Post-op Pain Management: Tylenol  PO (pre-op)*   Induction: Intravenous  PONV Risk Score and Plan: 3 and Ondansetron , Dexamethasone , Midazolam  and Treatment may vary due to age or medical condition  Airway Management Planned: Oral ETT  Additional Equipment: Arterial line and CVP  Intra-op Plan:   Post-operative Plan: Possible Post-op  intubation/ventilation  Informed Consent: I have reviewed the patients History and Physical, chart, labs and discussed the procedure including the risks, benefits and alternatives for the proposed anesthesia with the patient or authorized representative who has indicated his/her understanding and acceptance.     Dental advisory given  Plan Discussed with: CRNA  Anesthesia Plan Comments: (2 x PIV vs. CVL  See PAT note written 07/14/2024 by Allison Zelenak, PA-C. He has enlarging right femoral pseudoaneurysm. On 4-5L home O2. ICH 08/2023, severe AS with recent cardiology follow-up with Dr. [place].   )         Anesthesia Quick Evaluation

## 2024-07-17 ENCOUNTER — Encounter (HOSPITAL_COMMUNITY): Admission: RE | Disposition: A | Discharge status: Skilled Nursing Facility | Payer: Self-pay | Source: Home / Self Care

## 2024-07-17 ENCOUNTER — Encounter (HOSPITAL_COMMUNITY): Payer: Self-pay | Admitting: Vascular Surgery

## 2024-07-17 ENCOUNTER — Inpatient Hospital Stay (HOSPITAL_COMMUNITY): Payer: Self-pay | Admitting: Certified Registered"

## 2024-07-17 ENCOUNTER — Other Ambulatory Visit: Payer: Self-pay

## 2024-07-17 ENCOUNTER — Inpatient Hospital Stay (HOSPITAL_COMMUNITY)
Admission: RE | Admit: 2024-07-17 | Discharge: 2024-07-25 | DRG: 270 | Disposition: A | Discharge status: Skilled Nursing Facility

## 2024-07-17 ENCOUNTER — Inpatient Hospital Stay (HOSPITAL_COMMUNITY)

## 2024-07-17 DIAGNOSIS — D62 Acute posthemorrhagic anemia: Secondary | ICD-10-CM | POA: Diagnosis not present

## 2024-07-17 DIAGNOSIS — I5022 Chronic systolic (congestive) heart failure: Secondary | ICD-10-CM | POA: Diagnosis not present

## 2024-07-17 DIAGNOSIS — E1169 Type 2 diabetes mellitus with other specified complication: Secondary | ICD-10-CM | POA: Diagnosis not present

## 2024-07-17 DIAGNOSIS — T82898A Other specified complication of vascular prosthetic devices, implants and grafts, initial encounter: Secondary | ICD-10-CM

## 2024-07-17 DIAGNOSIS — T81718A Complication of other artery following a procedure, not elsewhere classified, initial encounter: Secondary | ICD-10-CM | POA: Diagnosis not present

## 2024-07-17 DIAGNOSIS — I152 Hypertension secondary to endocrine disorders: Secondary | ICD-10-CM | POA: Diagnosis not present

## 2024-07-17 DIAGNOSIS — G8194 Hemiplegia, unspecified affecting left nondominant side: Secondary | ICD-10-CM

## 2024-07-17 DIAGNOSIS — I517 Cardiomegaly: Secondary | ICD-10-CM | POA: Diagnosis not present

## 2024-07-17 DIAGNOSIS — I272 Pulmonary hypertension, unspecified: Secondary | ICD-10-CM | POA: Diagnosis present

## 2024-07-17 DIAGNOSIS — E118 Type 2 diabetes mellitus with unspecified complications: Secondary | ICD-10-CM

## 2024-07-17 DIAGNOSIS — G928 Other toxic encephalopathy: Secondary | ICD-10-CM | POA: Diagnosis not present

## 2024-07-17 DIAGNOSIS — T402X5A Adverse effect of other opioids, initial encounter: Secondary | ICD-10-CM | POA: Diagnosis not present

## 2024-07-17 DIAGNOSIS — R000 Tachycardia, unspecified: Secondary | ICD-10-CM | POA: Diagnosis not present

## 2024-07-17 DIAGNOSIS — L89312 Pressure ulcer of right buttock, stage 2: Secondary | ICD-10-CM | POA: Diagnosis present

## 2024-07-17 DIAGNOSIS — F32A Depression, unspecified: Secondary | ICD-10-CM | POA: Diagnosis present

## 2024-07-17 DIAGNOSIS — R471 Dysarthria and anarthria: Secondary | ICD-10-CM | POA: Diagnosis not present

## 2024-07-17 DIAGNOSIS — E1140 Type 2 diabetes mellitus with diabetic neuropathy, unspecified: Secondary | ICD-10-CM | POA: Diagnosis present

## 2024-07-17 DIAGNOSIS — J9811 Atelectasis: Secondary | ICD-10-CM | POA: Diagnosis not present

## 2024-07-17 DIAGNOSIS — N179 Acute kidney failure, unspecified: Secondary | ICD-10-CM | POA: Diagnosis not present

## 2024-07-17 DIAGNOSIS — E785 Hyperlipidemia, unspecified: Secondary | ICD-10-CM | POA: Diagnosis present

## 2024-07-17 DIAGNOSIS — J4489 Other specified chronic obstructive pulmonary disease: Secondary | ICD-10-CM | POA: Diagnosis present

## 2024-07-17 DIAGNOSIS — T2200XS Burn of unspecified degree of shoulder and upper limb, except wrist and hand, unspecified site, sequela: Secondary | ICD-10-CM

## 2024-07-17 DIAGNOSIS — R29818 Other symptoms and signs involving the nervous system: Secondary | ICD-10-CM | POA: Diagnosis not present

## 2024-07-17 DIAGNOSIS — Z79899 Other long term (current) drug therapy: Secondary | ICD-10-CM

## 2024-07-17 DIAGNOSIS — I5032 Chronic diastolic (congestive) heart failure: Secondary | ICD-10-CM | POA: Diagnosis not present

## 2024-07-17 DIAGNOSIS — Z9981 Dependence on supplemental oxygen: Secondary | ICD-10-CM

## 2024-07-17 DIAGNOSIS — R569 Unspecified convulsions: Secondary | ICD-10-CM | POA: Diagnosis not present

## 2024-07-17 DIAGNOSIS — I739 Peripheral vascular disease, unspecified: Secondary | ICD-10-CM | POA: Diagnosis present

## 2024-07-17 DIAGNOSIS — I69251 Hemiplegia and hemiparesis following other nontraumatic intracranial hemorrhage affecting right dominant side: Secondary | ICD-10-CM | POA: Diagnosis not present

## 2024-07-17 DIAGNOSIS — I724 Aneurysm of artery of lower extremity: Principal | ICD-10-CM | POA: Diagnosis present

## 2024-07-17 DIAGNOSIS — J449 Chronic obstructive pulmonary disease, unspecified: Secondary | ICD-10-CM | POA: Diagnosis not present

## 2024-07-17 DIAGNOSIS — D619 Aplastic anemia, unspecified: Secondary | ICD-10-CM | POA: Insufficient documentation

## 2024-07-17 DIAGNOSIS — R509 Fever, unspecified: Secondary | ICD-10-CM | POA: Diagnosis not present

## 2024-07-17 DIAGNOSIS — E1122 Type 2 diabetes mellitus with diabetic chronic kidney disease: Secondary | ICD-10-CM | POA: Diagnosis not present

## 2024-07-17 DIAGNOSIS — I6523 Occlusion and stenosis of bilateral carotid arteries: Secondary | ICD-10-CM | POA: Diagnosis not present

## 2024-07-17 DIAGNOSIS — Z7401 Bed confinement status: Secondary | ICD-10-CM | POA: Diagnosis not present

## 2024-07-17 DIAGNOSIS — T82868A Thrombosis of vascular prosthetic devices, implants and grafts, initial encounter: Secondary | ICD-10-CM | POA: Diagnosis not present

## 2024-07-17 DIAGNOSIS — Z8249 Family history of ischemic heart disease and other diseases of the circulatory system: Secondary | ICD-10-CM

## 2024-07-17 DIAGNOSIS — R590 Localized enlarged lymph nodes: Secondary | ICD-10-CM | POA: Diagnosis not present

## 2024-07-17 DIAGNOSIS — J9601 Acute respiratory failure with hypoxia: Secondary | ICD-10-CM | POA: Diagnosis not present

## 2024-07-17 DIAGNOSIS — Z794 Long term (current) use of insulin: Secondary | ICD-10-CM | POA: Diagnosis not present

## 2024-07-17 DIAGNOSIS — T402X1A Poisoning by other opioids, accidental (unintentional), initial encounter: Secondary | ICD-10-CM | POA: Diagnosis not present

## 2024-07-17 DIAGNOSIS — Z8673 Personal history of transient ischemic attack (TIA), and cerebral infarction without residual deficits: Secondary | ICD-10-CM | POA: Diagnosis not present

## 2024-07-17 DIAGNOSIS — I480 Paroxysmal atrial fibrillation: Secondary | ICD-10-CM | POA: Diagnosis present

## 2024-07-17 DIAGNOSIS — I6503 Occlusion and stenosis of bilateral vertebral arteries: Secondary | ICD-10-CM | POA: Diagnosis not present

## 2024-07-17 DIAGNOSIS — R531 Weakness: Secondary | ICD-10-CM | POA: Diagnosis present

## 2024-07-17 DIAGNOSIS — N1831 Chronic kidney disease, stage 3a: Secondary | ICD-10-CM

## 2024-07-17 DIAGNOSIS — K219 Gastro-esophageal reflux disease without esophagitis: Secondary | ICD-10-CM | POA: Diagnosis present

## 2024-07-17 DIAGNOSIS — G4089 Other seizures: Secondary | ICD-10-CM | POA: Diagnosis not present

## 2024-07-17 DIAGNOSIS — I63233 Cerebral infarction due to unspecified occlusion or stenosis of bilateral carotid arteries: Secondary | ICD-10-CM | POA: Diagnosis not present

## 2024-07-17 DIAGNOSIS — D649 Anemia, unspecified: Secondary | ICD-10-CM | POA: Insufficient documentation

## 2024-07-17 DIAGNOSIS — E1151 Type 2 diabetes mellitus with diabetic peripheral angiopathy without gangrene: Secondary | ICD-10-CM | POA: Diagnosis not present

## 2024-07-17 DIAGNOSIS — I130 Hypertensive heart and chronic kidney disease with heart failure and stage 1 through stage 4 chronic kidney disease, or unspecified chronic kidney disease: Secondary | ICD-10-CM

## 2024-07-17 DIAGNOSIS — Z452 Encounter for adjustment and management of vascular access device: Secondary | ICD-10-CM | POA: Diagnosis not present

## 2024-07-17 DIAGNOSIS — Q214 Aortopulmonary septal defect: Secondary | ICD-10-CM | POA: Diagnosis not present

## 2024-07-17 DIAGNOSIS — J439 Emphysema, unspecified: Secondary | ICD-10-CM | POA: Diagnosis present

## 2024-07-17 DIAGNOSIS — Z48812 Encounter for surgical aftercare following surgery on the circulatory system: Secondary | ICD-10-CM | POA: Diagnosis not present

## 2024-07-17 DIAGNOSIS — Z833 Family history of diabetes mellitus: Secondary | ICD-10-CM

## 2024-07-17 DIAGNOSIS — I420 Dilated cardiomyopathy: Secondary | ICD-10-CM | POA: Diagnosis present

## 2024-07-17 DIAGNOSIS — G319 Degenerative disease of nervous system, unspecified: Secondary | ICD-10-CM | POA: Diagnosis not present

## 2024-07-17 DIAGNOSIS — G9341 Metabolic encephalopathy: Secondary | ICD-10-CM | POA: Diagnosis present

## 2024-07-17 DIAGNOSIS — I352 Nonrheumatic aortic (valve) stenosis with insufficiency: Secondary | ICD-10-CM | POA: Diagnosis present

## 2024-07-17 DIAGNOSIS — E1159 Type 2 diabetes mellitus with other circulatory complications: Secondary | ICD-10-CM | POA: Diagnosis not present

## 2024-07-17 DIAGNOSIS — R0989 Other specified symptoms and signs involving the circulatory and respiratory systems: Secondary | ICD-10-CM | POA: Diagnosis not present

## 2024-07-17 DIAGNOSIS — J9621 Acute and chronic respiratory failure with hypoxia: Secondary | ICD-10-CM | POA: Diagnosis not present

## 2024-07-17 DIAGNOSIS — R0609 Other forms of dyspnea: Secondary | ICD-10-CM | POA: Diagnosis not present

## 2024-07-17 DIAGNOSIS — Y832 Surgical operation with anastomosis, bypass or graft as the cause of abnormal reaction of the patient, or of later complication, without mention of misadventure at the time of the procedure: Secondary | ICD-10-CM | POA: Diagnosis present

## 2024-07-17 DIAGNOSIS — T300 Burn of unspecified body region, unspecified degree: Secondary | ICD-10-CM

## 2024-07-17 DIAGNOSIS — J690 Pneumonitis due to inhalation of food and vomit: Secondary | ICD-10-CM | POA: Diagnosis not present

## 2024-07-17 DIAGNOSIS — I6381 Other cerebral infarction due to occlusion or stenosis of small artery: Secondary | ICD-10-CM | POA: Diagnosis not present

## 2024-07-17 DIAGNOSIS — Z95828 Presence of other vascular implants and grafts: Secondary | ICD-10-CM | POA: Diagnosis not present

## 2024-07-17 DIAGNOSIS — Z7901 Long term (current) use of anticoagulants: Secondary | ICD-10-CM

## 2024-07-17 DIAGNOSIS — Z87891 Personal history of nicotine dependence: Secondary | ICD-10-CM

## 2024-07-17 DIAGNOSIS — Z789 Other specified health status: Secondary | ICD-10-CM

## 2024-07-17 DIAGNOSIS — Z83438 Family history of other disorder of lipoprotein metabolism and other lipidemia: Secondary | ICD-10-CM

## 2024-07-17 DIAGNOSIS — R042 Hemoptysis: Secondary | ICD-10-CM | POA: Diagnosis not present

## 2024-07-17 DIAGNOSIS — L89302 Pressure ulcer of unspecified buttock, stage 2: Secondary | ICD-10-CM | POA: Diagnosis present

## 2024-07-17 DIAGNOSIS — E46 Unspecified protein-calorie malnutrition: Secondary | ICD-10-CM | POA: Diagnosis not present

## 2024-07-17 DIAGNOSIS — R918 Other nonspecific abnormal finding of lung field: Secondary | ICD-10-CM | POA: Diagnosis not present

## 2024-07-17 DIAGNOSIS — I2510 Atherosclerotic heart disease of native coronary artery without angina pectoris: Secondary | ICD-10-CM | POA: Diagnosis not present

## 2024-07-17 DIAGNOSIS — I4891 Unspecified atrial fibrillation: Secondary | ICD-10-CM | POA: Diagnosis not present

## 2024-07-17 DIAGNOSIS — I728 Aneurysm of other specified arteries: Secondary | ICD-10-CM

## 2024-07-17 DIAGNOSIS — F419 Anxiety disorder, unspecified: Secondary | ICD-10-CM | POA: Diagnosis present

## 2024-07-17 HISTORY — PX: FALSE ANEURYSM REPAIR: SHX5152

## 2024-07-17 HISTORY — PX: APPLICATION OF WOUND VAC: SHX5189

## 2024-07-17 HISTORY — PX: FEMORAL-POPLITEAL BYPASS GRAFT: SHX937

## 2024-07-17 HISTORY — PX: BYPASS GRAFT FEMORAL-PERONEAL: SHX5762

## 2024-07-17 HISTORY — PX: MUSCLE FLAP CLOSURE: SHX2054

## 2024-07-17 HISTORY — DX: Cerebral infarction, unspecified: I63.9

## 2024-07-17 LAB — SURGICAL PCR SCREEN
MRSA, PCR: NEGATIVE
Staphylococcus aureus: POSITIVE — AB

## 2024-07-17 LAB — POCT I-STAT, CHEM 8
BUN: 21 mg/dL (ref 8–23)
Calcium, Ion: 1.21 mmol/L (ref 1.15–1.40)
Chloride: 112 mmol/L — ABNORMAL HIGH (ref 98–111)
Creatinine, Ser: 1.3 mg/dL — ABNORMAL HIGH (ref 0.61–1.24)
Glucose, Bld: 122 mg/dL — ABNORMAL HIGH (ref 70–99)
HCT: 34 % — ABNORMAL LOW (ref 39.0–52.0)
Hemoglobin: 11.6 g/dL — ABNORMAL LOW (ref 13.0–17.0)
Potassium: 4 mmol/L (ref 3.5–5.1)
Sodium: 146 mmol/L — ABNORMAL HIGH (ref 135–145)
TCO2: 23 mmol/L (ref 22–32)

## 2024-07-17 LAB — GLUCOSE, CAPILLARY
Glucose-Capillary: 119 mg/dL — ABNORMAL HIGH (ref 70–99)
Glucose-Capillary: 103 mg/dL — ABNORMAL HIGH (ref 70–99)
Glucose-Capillary: 95 mg/dL (ref 70–99)
Glucose-Capillary: 131 mg/dL — ABNORMAL HIGH (ref 70–99)
Glucose-Capillary: 152 mg/dL — ABNORMAL HIGH (ref 70–99)
Glucose-Capillary: 142 mg/dL — ABNORMAL HIGH (ref 70–99)
Glucose-Capillary: 165 mg/dL — ABNORMAL HIGH (ref 70–99)
Glucose-Capillary: 122 mg/dL — ABNORMAL HIGH (ref 70–99)

## 2024-07-17 LAB — CBC
HCT: 34.1 % — ABNORMAL LOW (ref 39.0–52.0)
Hemoglobin: 10.9 g/dL — ABNORMAL LOW (ref 13.0–17.0)
MCH: 27.5 pg (ref 26.0–34.0)
MCHC: 32 g/dL (ref 30.0–36.0)
MCV: 86.1 fL (ref 80.0–100.0)
Platelets: 175 K/uL (ref 150–400)
RBC: 3.96 MIL/uL — ABNORMAL LOW (ref 4.22–5.81)
RDW: 24.5 % — ABNORMAL HIGH (ref 11.5–15.5)
WBC: 15.3 K/uL — ABNORMAL HIGH (ref 4.0–10.5)
nRBC: 0 % (ref 0.0–0.2)

## 2024-07-17 LAB — CREATININE, SERUM
Creatinine, Ser: 1.45 mg/dL — ABNORMAL HIGH (ref 0.61–1.24)
GFR, Estimated: 51 mL/min — ABNORMAL LOW (ref >60)

## 2024-07-17 LAB — PREPARE RBC (CROSSMATCH)

## 2024-07-17 SURGERY — REPAIR, PSEUDOANEURYSM
Anesthesia: General | Site: Groin | Laterality: Right

## 2024-07-17 MED ORDER — ACETAMINOPHEN 500 MG PO TABS
ORAL_TABLET | ORAL | Status: AC
  Administered 2024-07-17: 1000 mg via ORAL
  Filled 2024-07-17: qty 2

## 2024-07-17 MED ORDER — PROTAMINE SULFATE 10 MG/ML IV SOLN
INTRAVENOUS | Status: DC | PRN
  Administered 2024-07-17: 50 mg via INTRAVENOUS

## 2024-07-17 MED ORDER — ACETAMINOPHEN 325 MG PO TABS
650.0000 mg | ORAL_TABLET | ORAL | Status: DC | PRN
  Administered 2024-07-21: 650 mg via ORAL
  Filled 2024-07-17: qty 2

## 2024-07-17 MED ORDER — HEPARIN 6000 UNIT IRRIGATION SOLUTION
Status: DC | PRN
  Administered 2024-07-17: 1

## 2024-07-17 MED ORDER — CLEVIDIPINE BUTYRATE 0.5 MG/ML IV EMUL
INTRAVENOUS | Status: AC
  Filled 2024-07-17: qty 50

## 2024-07-17 MED ORDER — ACETAMINOPHEN 500 MG PO TABS
1000.0000 mg | ORAL_TABLET | Freq: Once | ORAL | Status: AC
  Filled 2024-07-17: qty 2

## 2024-07-17 MED ORDER — OXYCODONE-ACETAMINOPHEN 5-325 MG PO TABS: 1.0000 | ORAL_TABLET | ORAL | Status: DC | PRN

## 2024-07-17 MED ORDER — SODIUM CHLORIDE 0.9 % IV SOLN: INTRAVENOUS | Status: AC

## 2024-07-17 MED ORDER — LIDOCAINE 2% (20 MG/ML) 5 ML SYRINGE
INTRAMUSCULAR | Status: AC
  Filled 2024-07-17: qty 5

## 2024-07-17 MED ORDER — PROTAMINE SULFATE 10 MG/ML IV SOLN
INTRAVENOUS | Status: AC
  Filled 2024-07-17: qty 5

## 2024-07-17 MED ORDER — POTASSIUM CHLORIDE CRYS ER 20 MEQ PO TBCR: 40.0000 meq | EXTENDED_RELEASE_TABLET | Freq: Every day | ORAL | Status: DC | PRN

## 2024-07-17 MED ORDER — HYDROMORPHONE HCL 1 MG/ML IJ SOLN
0.5000 mg | INTRAMUSCULAR | Status: DC | PRN
  Administered 2024-07-17 – 2024-07-19 (×4): 0.5 mg via INTRAVENOUS
  Filled 2024-07-17 (×4): qty 0.5

## 2024-07-17 MED ORDER — HEPARIN SODIUM (PORCINE) 1000 UNIT/ML IJ SOLN
INTRAMUSCULAR | Status: AC
  Filled 2024-07-17: qty 10

## 2024-07-17 MED ORDER — GABAPENTIN 300 MG PO CAPS
300.0000 mg | ORAL_CAPSULE | Freq: Every day | ORAL | Status: DC
  Administered 2024-07-18 – 2024-07-25 (×8): 300 mg via ORAL
  Filled 2024-07-17 (×8): qty 1

## 2024-07-17 MED ORDER — ACETAMINOPHEN 650 MG RE SUPP: 325.0000 mg | RECTAL | Status: DC | PRN

## 2024-07-17 MED ORDER — SODIUM CHLORIDE 0.9 % IV SOLN
10.0000 mL/h | Freq: Once | INTRAVENOUS | Status: DC
Start: 2024-07-17 — End: 2024-07-17

## 2024-07-17 MED ORDER — SODIUM CHLORIDE 0.9 % IV SOLN: INTRAVENOUS | Status: DC | PRN

## 2024-07-17 MED ORDER — SUGAMMADEX SODIUM 200 MG/2ML IV SOLN
INTRAVENOUS | Status: DC | PRN
  Administered 2024-07-17: 200 mg via INTRAVENOUS
  Administered 2024-07-17: 100 mg via INTRAVENOUS

## 2024-07-17 MED ORDER — IPRATROPIUM-ALBUTEROL 0.5-2.5 (3) MG/3ML IN SOLN
RESPIRATORY_TRACT | Status: AC
  Filled 2024-07-17: qty 3

## 2024-07-17 MED ORDER — FENTANYL CITRATE (PF) 250 MCG/5ML IJ SOLN
INTRAMUSCULAR | Status: AC
  Filled 2024-07-17: qty 5

## 2024-07-17 MED ORDER — HEPARIN SODIUM (PORCINE) 5000 UNIT/ML IJ SOLN
5000.0000 [IU] | Freq: Three times a day (TID) | INTRAMUSCULAR | Status: DC
Start: 2024-07-18 — End: 2024-07-24
  Administered 2024-07-18 – 2024-07-24 (×19): 5000 [IU] via SUBCUTANEOUS
  Filled 2024-07-17 (×19): qty 1

## 2024-07-17 MED ORDER — BUDESON-GLYCOPYRROL-FORMOTEROL 160-9-4.8 MCG/ACT IN AERO
2.0000 | INHALATION_SPRAY | Freq: Two times a day (BID) | RESPIRATORY_TRACT | Status: DC
  Administered 2024-07-18 – 2024-07-25 (×15): 2 via RESPIRATORY_TRACT
  Filled 2024-07-17: qty 5.9

## 2024-07-17 MED ORDER — PHENYLEPHRINE HCL-NACL 20-0.9 MG/250ML-% IV SOLN
INTRAVENOUS | Status: DC | PRN
  Administered 2024-07-17: 25 ug/min via INTRAVENOUS

## 2024-07-17 MED ORDER — PROPOFOL 10 MG/ML IV BOLUS
INTRAVENOUS | Status: DC | PRN
  Administered 2024-07-17: 70 mg via INTRAVENOUS

## 2024-07-17 MED ORDER — FENTANYL CITRATE (PF) 250 MCG/5ML IJ SOLN
INTRAMUSCULAR | Status: DC | PRN
  Administered 2024-07-17: 50 ug via INTRAVENOUS
  Administered 2024-07-17: 100 ug via INTRAVENOUS
  Administered 2024-07-17: 50 ug via INTRAVENOUS

## 2024-07-17 MED ORDER — PROPOFOL 10 MG/ML IV BOLUS
INTRAVENOUS | Status: AC
  Filled 2024-07-17: qty 20

## 2024-07-17 MED ORDER — CARVEDILOL 6.25 MG PO TABS
6.2500 mg | ORAL_TABLET | Freq: Two times a day (BID) | ORAL | Status: DC
  Administered 2024-07-17 – 2024-07-24 (×14): 6.25 mg via ORAL
  Filled 2024-07-17 (×6): qty 1
  Filled 2024-07-17: qty 2
  Filled 2024-07-17 (×5): qty 1
  Filled 2024-07-17: qty 2
  Filled 2024-07-17 (×2): qty 1

## 2024-07-17 MED ORDER — ORAL CARE MOUTH RINSE: 15.0000 mL | Freq: Once | OROMUCOSAL | Status: AC

## 2024-07-17 MED ORDER — CHLORHEXIDINE GLUCONATE CLOTH 2 % EX PADS: 6.0000 | MEDICATED_PAD | Freq: Once | CUTANEOUS | Status: DC

## 2024-07-17 MED ORDER — DROPERIDOL 2.5 MG/ML IJ SOLN: 0.6250 mg | Freq: Once | INTRAMUSCULAR | Status: DC | PRN

## 2024-07-17 MED ORDER — ONDANSETRON HCL 4 MG/2ML IJ SOLN: 4.0000 mg | Freq: Four times a day (QID) | INTRAMUSCULAR | Status: DC | PRN

## 2024-07-17 MED ORDER — ONDANSETRON HCL 4 MG/2ML IJ SOLN
INTRAMUSCULAR | Status: AC
  Filled 2024-07-17: qty 2

## 2024-07-17 MED ORDER — CEFAZOLIN SODIUM-DEXTROSE 2-4 GM/100ML-% IV SOLN
2.0000 g | INTRAVENOUS | Status: AC
Start: 2024-07-17 — End: 2024-07-17
  Administered 2024-07-17: 2 g via INTRAVENOUS

## 2024-07-17 MED ORDER — LACTATED RINGERS IV SOLN
INTRAVENOUS | Status: DC | PRN
Start: 2024-07-17 — End: 2024-07-17

## 2024-07-17 MED ORDER — ALBUMIN HUMAN 5 % IV SOLN: INTRAVENOUS | Status: DC | PRN

## 2024-07-17 MED ORDER — SODIUM CHLORIDE 0.9 % IV SOLN
10.0000 mL/h | Freq: Once | INTRAVENOUS | Status: DC
Start: 2024-07-17 — End: 2024-07-25

## 2024-07-17 MED ORDER — SENNA 8.6 MG PO TABS: 1.0000 | ORAL_TABLET | Freq: Every day | ORAL | Status: DC | PRN

## 2024-07-17 MED ORDER — ROCURONIUM BROMIDE 10 MG/ML (PF) SYRINGE
PREFILLED_SYRINGE | INTRAVENOUS | Status: AC
  Filled 2024-07-17: qty 10

## 2024-07-17 MED ORDER — OXYCODONE HCL 5 MG PO TABS
5.0000 mg | ORAL_TABLET | Freq: Four times a day (QID) | ORAL | Status: DC | PRN
  Administered 2024-07-18 (×2): 5 mg via ORAL
  Filled 2024-07-17 (×2): qty 1

## 2024-07-17 MED ORDER — VANCOMYCIN HCL 1250 MG/250ML IV SOLN
1250.0000 mg | INTRAVENOUS | Status: DC
Start: 2024-07-17 — End: 2024-07-19
  Administered 2024-07-17 – 2024-07-18 (×2): 1250 mg via INTRAVENOUS
  Filled 2024-07-17 (×3): qty 250

## 2024-07-17 MED ORDER — MUPIROCIN 2 % EX OINT
1.0000 | TOPICAL_OINTMENT | Freq: Two times a day (BID) | CUTANEOUS | Status: AC
Start: 2024-07-17 — End: 2024-07-22
  Administered 2024-07-17 – 2024-07-22 (×10): 1 via NASAL
  Filled 2024-07-17 (×2): qty 22

## 2024-07-17 MED ORDER — ROSUVASTATIN CALCIUM 5 MG PO TABS
10.0000 mg | ORAL_TABLET | Freq: Every day | ORAL | Status: DC
  Administered 2024-07-17 – 2024-07-24 (×8): 10 mg via ORAL
  Filled 2024-07-17 (×8): qty 2

## 2024-07-17 MED ORDER — SODIUM CHLORIDE 0.9 % IV SOLN: INTRAVENOUS | Status: DC

## 2024-07-17 MED ORDER — PHENYLEPHRINE 80 MCG/ML (10ML) SYRINGE FOR IV PUSH (FOR BLOOD PRESSURE SUPPORT)
PREFILLED_SYRINGE | INTRAVENOUS | Status: AC
  Filled 2024-07-17: qty 10

## 2024-07-17 MED ORDER — ACETAMINOPHEN 325 MG PO TABS: 325.0000 mg | ORAL_TABLET | ORAL | Status: DC | PRN

## 2024-07-17 MED ORDER — LACTATED RINGERS IV SOLN: INTRAVENOUS | Status: DC

## 2024-07-17 MED ORDER — MIRTAZAPINE 15 MG PO TABS
7.5000 mg | ORAL_TABLET | Freq: Every day | ORAL | Status: DC
  Administered 2024-07-17 – 2024-07-24 (×8): 7.5 mg via ORAL
  Filled 2024-07-17 (×8): qty 1

## 2024-07-17 MED ORDER — HEPARIN 6000 UNIT IRRIGATION SOLUTION
Status: AC
Start: 2024-07-17 — End: 2024-07-17
  Filled 2024-07-17: qty 500

## 2024-07-17 MED ORDER — VASOPRESSIN 20 UNIT/ML IV SOLN
INTRAVENOUS | Status: DC | PRN
  Administered 2024-07-17 (×5): 1 [IU] via INTRAVENOUS

## 2024-07-17 MED ORDER — PHENOL 1.4 % MT LIQD
1.0000 | OROMUCOSAL | Status: DC | PRN
  Administered 2024-07-18: 1 via OROMUCOSAL
  Filled 2024-07-17: qty 177

## 2024-07-17 MED ORDER — INSULIN ASPART 100 UNIT/ML IJ SOLN
0.0000 [IU] | Freq: Three times a day (TID) | INTRAMUSCULAR | Status: DC
  Administered 2024-07-18: 2 [IU] via SUBCUTANEOUS
  Administered 2024-07-18: 3 [IU] via SUBCUTANEOUS
  Administered 2024-07-19: 1 [IU] via SUBCUTANEOUS
  Administered 2024-07-19: 2 [IU] via SUBCUTANEOUS
  Administered 2024-07-20 – 2024-07-21 (×3): 1 [IU] via SUBCUTANEOUS
  Administered 2024-07-21: 9 [IU] via SUBCUTANEOUS
  Administered 2024-07-22 – 2024-07-23 (×3): 1 [IU] via SUBCUTANEOUS

## 2024-07-17 MED ORDER — 0.9 % SODIUM CHLORIDE (POUR BTL) OPTIME
TOPICAL | Status: DC | PRN
  Administered 2024-07-17: 2000 mL

## 2024-07-17 MED ORDER — CHLORHEXIDINE GLUCONATE 0.12 % MT SOLN: 15.0000 mL | Freq: Once | OROMUCOSAL | Status: AC

## 2024-07-17 MED ORDER — CEFAZOLIN SODIUM-DEXTROSE 2-4 GM/100ML-% IV SOLN
INTRAVENOUS | Status: AC
  Filled 2024-07-17: qty 100

## 2024-07-17 MED ORDER — PHENYLEPHRINE 80 MCG/ML (10ML) SYRINGE FOR IV PUSH (FOR BLOOD PRESSURE SUPPORT)
PREFILLED_SYRINGE | INTRAVENOUS | Status: DC | PRN
  Administered 2024-07-17 (×2): 80 ug via INTRAVENOUS
  Administered 2024-07-17: 160 ug via INTRAVENOUS
  Administered 2024-07-17: 80 ug via INTRAVENOUS

## 2024-07-17 MED ORDER — BISACODYL 10 MG RE SUPP: 10.0000 mg | Freq: Every day | RECTAL | Status: DC | PRN

## 2024-07-17 MED ORDER — CHLORHEXIDINE GLUCONATE CLOTH 2 % EX PADS
6.0000 | MEDICATED_PAD | Freq: Every day | CUTANEOUS | Status: AC
Start: 2024-07-17 — End: 2024-07-22
  Administered 2024-07-17 – 2024-07-21 (×5): 6 via TOPICAL

## 2024-07-17 MED ORDER — SODIUM CHLORIDE 0.9 % IV SOLN: 500.0000 mL | Freq: Once | INTRAVENOUS | Status: DC | PRN

## 2024-07-17 MED ORDER — PANTOPRAZOLE SODIUM 40 MG PO TBEC
40.0000 mg | DELAYED_RELEASE_TABLET | Freq: Every day | ORAL | Status: DC
  Administered 2024-07-17 – 2024-07-25 (×9): 40 mg via ORAL
  Filled 2024-07-17 (×9): qty 1

## 2024-07-17 MED ORDER — GABAPENTIN 300 MG PO CAPS
600.0000 mg | ORAL_CAPSULE | Freq: Every day | ORAL | Status: DC
Start: 2024-07-17 — End: 2024-07-25
  Administered 2024-07-17 – 2024-07-24 (×8): 600 mg via ORAL
  Filled 2024-07-17 (×8): qty 2

## 2024-07-17 MED ORDER — PIPERACILLIN-TAZOBACTAM 3.375 G IVPB
3.3750 g | Freq: Three times a day (TID) | INTRAVENOUS | Status: DC
Start: 2024-07-17 — End: 2024-07-23
  Administered 2024-07-17 – 2024-07-23 (×18): 3.375 g via INTRAVENOUS
  Filled 2024-07-17 (×18): qty 50

## 2024-07-17 MED ORDER — ROCURONIUM BROMIDE 10 MG/ML (PF) SYRINGE
PREFILLED_SYRINGE | INTRAVENOUS | Status: DC | PRN
  Administered 2024-07-17: 20 mg via INTRAVENOUS
  Administered 2024-07-17: 40 mg via INTRAVENOUS
  Administered 2024-07-17: 60 mg via INTRAVENOUS

## 2024-07-17 MED ORDER — EPHEDRINE 5 MG/ML INJ
INTRAVENOUS | Status: AC
  Filled 2024-07-17: qty 5

## 2024-07-17 MED ORDER — ACETAMINOPHEN 650 MG RE SUPP
650.0000 mg | RECTAL | Status: DC | PRN
  Administered 2024-07-19: 650 mg via RECTAL
  Filled 2024-07-17: qty 1

## 2024-07-17 MED ORDER — CHLORHEXIDINE GLUCONATE 0.12 % MT SOLN
OROMUCOSAL | Status: AC
  Administered 2024-07-17: 15 mL via OROMUCOSAL
  Filled 2024-07-17: qty 15

## 2024-07-17 MED ORDER — POLYETHYLENE GLYCOL 3350 17 G PO PACK: 17.0000 g | PACK | Freq: Every day | ORAL | Status: DC | PRN

## 2024-07-17 MED ORDER — FENTANYL CITRATE (PF) 100 MCG/2ML IJ SOLN: 25.0000 ug | INTRAMUSCULAR | Status: DC | PRN

## 2024-07-17 MED ORDER — IPRATROPIUM-ALBUTEROL 0.5-2.5 (3) MG/3ML IN SOLN
3.0000 mL | Freq: Once | RESPIRATORY_TRACT | Status: AC
  Administered 2024-07-17: 3 mL via RESPIRATORY_TRACT

## 2024-07-17 MED ORDER — ONDANSETRON HCL 4 MG/2ML IJ SOLN
INTRAMUSCULAR | Status: DC | PRN
  Administered 2024-07-17: 4 mg via INTRAVENOUS

## 2024-07-17 MED ORDER — HEPARIN SODIUM (PORCINE) 1000 UNIT/ML IJ SOLN
INTRAMUSCULAR | Status: DC | PRN
Start: 2024-07-17 — End: 2024-07-17
  Administered 2024-07-17: 3000 [IU] via INTRAVENOUS
  Administered 2024-07-17: 7000 [IU] via INTRAVENOUS
  Administered 2024-07-17: 2000 [IU] via INTRAVENOUS

## 2024-07-17 SURGICAL SUPPLY — 48 items
BAG COUNTER SPONGE SURGICOUNT (BAG) ×2 IMPLANT
BANDAGE ESMARK 6X9 LF (GAUZE/BANDAGES/DRESSINGS) IMPLANT
BIOPATCH RED 1 DISK 7.0 (GAUZE/BANDAGES/DRESSINGS) ×1 IMPLANT
BNDG ELASTIC 4X5.8 VLCR STR LF (GAUZE/BANDAGES/DRESSINGS) IMPLANT
CANISTER SUCTION 3000ML PPV (SUCTIONS) ×2 IMPLANT
CANISTER WOUNDNEG PRESSURE 500 (CANNISTER) ×1 IMPLANT
CATH EMB 3FR 40 (CATHETERS) ×1 IMPLANT
CATH EMB 3FR 80 (CATHETERS) ×1 IMPLANT
CATH EMB 4FR 80 (CATHETERS) IMPLANT
CLIP TI MEDIUM 24 (CLIP) ×2 IMPLANT
CLIP TI MEDIUM 6 (CLIP) ×2 IMPLANT
CLIP TI WIDE RED SMALL 24 (CLIP) ×2 IMPLANT
CLIP TI WIDE RED SMALL 6 (CLIP) ×2 IMPLANT
CUFF TOURN SGL QUICK 18X4 (TOURNIQUET CUFF) IMPLANT
CUFF TOURN SGL QUICK 42 (TOURNIQUET CUFF) IMPLANT
CUFF TRNQT CYL 24X4X16.5-23 (TOURNIQUET CUFF) IMPLANT
CUFF TRNQT CYL 34X4.125X (TOURNIQUET CUFF) IMPLANT
DERMABOND ADVANCED .7 DNX12 (GAUZE/BANDAGES/DRESSINGS) ×2 IMPLANT
DRAIN CHANNEL 15F RND FF W/TCR (WOUND CARE) ×1 IMPLANT
DRAPE INCISE IOBAN 66X45 STRL (DRAPES) ×1 IMPLANT
DRSG VAC GRANUFOAM MED (GAUZE/BANDAGES/DRESSINGS) ×1 IMPLANT
ELECTRODE REM PT RTRN 9FT ADLT (ELECTROSURGICAL) ×2 IMPLANT
EVACUATOR SILICONE 100CC (DRAIN) ×1 IMPLANT
GAUZE SPONGE 4X4 12PLY STRL (GAUZE/BANDAGES/DRESSINGS) ×1 IMPLANT
GLOVE BIO SURGEON STRL SZ7.5 (GLOVE) ×2 IMPLANT
GLOVE BIOGEL PI IND STRL 8 (GLOVE) ×2 IMPLANT
GOWN STRL REUS W/ TWL LRG LVL3 (GOWN DISPOSABLE) ×4 IMPLANT
GOWN STRL REUS W/ TWL XL LVL3 (GOWN DISPOSABLE) ×4 IMPLANT
GRAFT VASC STRG 30X8KNIT (Vascular Products) ×1 IMPLANT
KIT BASIN OR (CUSTOM PROCEDURE TRAY) ×2 IMPLANT
KIT TURNOVER KIT B (KITS) ×2 IMPLANT
NS IRRIG 1000ML POUR BTL (IV SOLUTION) ×4 IMPLANT
PACK CV ACCESS (CUSTOM PROCEDURE TRAY) ×2 IMPLANT
PACK PERIPHERAL VASCULAR (CUSTOM PROCEDURE TRAY) ×2 IMPLANT
PAD ARMBOARD POSITIONER FOAM (MISCELLANEOUS) ×4 IMPLANT
PUNCH AORTIC ROTATE 4.0MM (MISCELLANEOUS) ×1 IMPLANT
STAPLER SKIN PROX 35W (STAPLE) IMPLANT
SUT ETHILON 2 0 PSLX (SUTURE) ×2 IMPLANT
SUT MNCRL AB 4-0 PS2 18 (SUTURE) ×2 IMPLANT
SUT PROLENE 5 0 C 1 24 (SUTURE) ×10 IMPLANT
SUT PROLENE 6 0 BV (SUTURE) ×5 IMPLANT
SUT VIC AB 2-0 CT1 TAPERPNT 27 (SUTURE) ×6 IMPLANT
SUT VIC AB 3-0 SH 27X BRD (SUTURE) ×2 IMPLANT
TAPE CLOTH SURG 4X10 WHT LF (GAUZE/BANDAGES/DRESSINGS) ×1 IMPLANT
TOWEL GREEN STERILE (TOWEL DISPOSABLE) ×2 IMPLANT
TRAY FOLEY MTR SLVR 16FR STAT (SET/KITS/TRAYS/PACK) ×2 IMPLANT
UNDERPAD 30X36 HEAVY ABSORB (UNDERPADS AND DIAPERS) ×2 IMPLANT
WATER STERILE IRR 1000ML POUR (IV SOLUTION) ×2 IMPLANT

## 2024-07-17 NOTE — Assessment & Plan Note (Signed)
" >>  ASSESSMENT AND PLAN FOR PAD (PERIPHERAL ARTERY DISEASE) WRITTEN ON 07/17/2024  6:30 PM BY GOMES, ADRIANA, DO  Extensive history of surgical repair (bypass grafts); most recently above. - Crestor  10 mg daily; can consider increase to 20 or 40 for high intensity therapy if lipids elevated "

## 2024-07-17 NOTE — Anesthesia Procedure Notes (Signed)
 Procedure Name: Intubation Date/Time: 07/17/2024 11:47 AM  Performed by: Delores Dus, CRNAPre-anesthesia Checklist: Patient identified, Emergency Drugs available, Suction available and Patient being monitored Patient Re-evaluated:Patient Re-evaluated prior to induction Oxygen  Delivery Method: Circle system utilized Preoxygenation: Pre-oxygenation with 100% oxygen  Induction Type: IV induction Ventilation: Mask ventilation without difficulty Laryngoscope Size: Miller and 2 Grade View: Grade I Tube type: Oral Tube size: 7.5 mm Number of attempts: 1 Airway Equipment and Method: Stylet and Oral airway Placement Confirmation: ETT inserted through vocal cords under direct vision, positive ETCO2 and breath sounds checked- equal and bilateral Secured at: 22 cm Tube secured with: Tape Dental Injury: Teeth and Oropharynx as per pre-operative assessment

## 2024-07-17 NOTE — Assessment & Plan Note (Addendum)
 POD 0 s/p right femoral artery pseudoaneurysm repair. Wound VAC present in right groin. Pain endorsing moderate right groin pain post-surgery. - Admitted to [facility] inpatient service, attending Dr. McDiarmid - Vascular surgery following - Dispo: Progressive - Antibiotics: IV Vancomycin  (9/22- ), IV Zosyn  (9/22- ) - MRSA negative, staph aureus positive on surgical PCR screen - Pain: Dilaudid  0.5 mg IV Q3H prn for severe pain, Oxycodone  5 mg q6h for moderate pain, and Tylenol  650 mg Q4H for mild pain - Bowel regimen: Miralax  and Senna daily prn for constipation - Wound culture: pending - Labs: AM CBC, BMP, lipid panel

## 2024-07-17 NOTE — Plan of Care (Cosign Needed)
 [facility]MT[facility] Brief Progress Note  S: Kyle [name] is a 73 y.o. male w/ pseudoaneurysm Right femoral artery. He is now s/p repair of right femoral common pseudoaneurysm with right CFA to profunda bypass and revision of right CFA to peroneal vein graft, right LE thrombectomy and Sartorius muscle flap right groin with wound vac placement. He is doing well. States pain well control and that he is doing well. Denies dizziness, lightheadedness, fever, or any s/s of infection    O: BP (!) 110/57   Pulse 72   Temp 97.7 F (36.5 C) (Oral)   Resp 15   Ht 5' 11 (1.803 m)   Wt 68.9 kg   SpO2 (!) 39%   BMI 21.19 kg/m   Physical Exam Cardiovascular:     Rate and Rhythm: Normal rate.     Pulses:          Dorsalis pedis pulses are detected w/ Doppler on the right side and detected w/ Doppler on the left side.       Posterior tibial pulses are detected w/ Doppler on the left side.  Pulmonary:     Effort: Pulmonary effort is normal.     Breath sounds: Normal breath sounds.  Abdominal:     General: Abdomen is flat.     Palpations: Abdomen is soft.     Comments: Last BM 07/16/24  Skin:    Capillary Refill: Capillary refill takes less than 2 seconds.     Comments: Right groin JP / minimal output w/ SS drainage NPWT CDI  Neurological:     Mental Status: He is alert.     Sensory: Sensory deficit present.     Motor: Weakness (RUE and RLE from hx of stroke) present.  Psychiatric:        Mood and Affect: Mood normal.        Behavior: Behavior normal.     A/P: Kyle [name] is doing well post-operatively. State pain well control. Drain w/ minimal output. NPWT CDI. Right DP and popliteal w/ + doppler signal.   Pseudoaneurysm of femoral artery following procedure  - Vascular surgery following Continue Antibiotics: IV Vancomycin  (9/22- ), IV Zosyn  (9/22- ) per vascular surgery  - Post op pain  Dilaudid  0.5 mg IV Q3H prn for severe pain Oxycodone  5 mg q6h for moderate pain Tylenol  650 mg Q4H for mild pain -  Continue to monitor wound vac and JP output  - Bowel regimen: Miralax  and Senna daily prn for constipation - Wound culture: pending - Labs: AM CBC, BMP, lipid panel - Regular diet - Continue IVF until tolerated PO   Kyle [name] B, DO 07/17/2024, 8:33 PM PGY-1, [facility] Night Resident  Please page [phone] with questions.

## 2024-07-17 NOTE — Anesthesia Procedure Notes (Signed)
 Arterial Line Insertion Start/End9/22/2025 9:51 AM, 07/17/2024 9:51 AM Performed by: CRNA  Patient location: Pre-op. Preanesthetic checklist: patient identified, IV checked, site marked, risks and benefits discussed, surgical consent, monitors and equipment checked, pre-op evaluation, timeout performed and anesthesia consent Lidocaine  1% used for infiltration Left, radial was placed Catheter size: 20 G Hand hygiene performed  and maximum sterile barriers used   Attempts: 1 Procedure performed without using ultrasound guided technique. Following insertion, dressing applied and Biopatch. Post procedure assessment: normal  Patient tolerated the procedure well with no immediate complications.

## 2024-07-17 NOTE — Progress Notes (Signed)
 Pharmacy Medication Assistance Program Note    07/17/2024  Patient ID: Kyle [name], male   DOB: [birth year], 73 y.o.   MRN: [id number]     06/30/2024  Outreach Medication One  Manufacturer Medication One Novo Nordisk  [employer] Novolog   Dose of Novolog  SLIDING SCALE: 2-10 UNITS THREE TIMES DAILY (30U MAX)  Type of Assistance Manufacturer Assistance  Date Application Sent to Prescriber 07/04/2024  Name of Prescriber OTTO FAIRLY  Date Application Received From Provider 07/11/2024  Date Application Submitted to Manufacturer 06/30/2024  Method Application Sent to Manufacturer Online  Patient Assistance Determination Approved  Approval Start Date 07/14/2024  Approval End Date 10/25/2024       07/17/2024  Patient ID: Kyle [name], male  DOB: 03/01/1951, 73 y.o.  MRN:  [id number]     06/30/2024  Outreach Medication Two  Manufacturer Medication Two Novo Nordisk  [employer] Tresiba   Dose of Tresiba  5 UNITS QD  Type of Sport and exercise psychologist  Date Application Sent to Prescriber 07/04/2024  Name of Prescriber OTTO FAIRLY  Date Application Received From Provider 07/11/2024  Method Application Sent to Manufacturer Online  Date Application Submitted to Manufacturer 06/30/2024  Patient Assistance Determination Approved  Approval Start Date 07/14/2024     RENEWAL APPROVED

## 2024-07-17 NOTE — H&P (Cosign Needed Addendum)
 Hospital Admission History and Physical Service Pager: [phone]  Patient name: Kyle [name] Medical record number: [id number] Date of Birth: [birth year] Age: 73 y.o. Gender: male  Primary Care Provider: Adele Song, MD Consultants: VVS Code Status: Full Code Preferred Emergency Contact:  Contact Information     Name Relation Home Work Mobile   Wrubel,[name] [phone]  [phone]   Chief Complaint: Post-op  Differential and Medical Decision Making:  KHAI ARRONA is a 73 y.o. male presenting following right femoral pseudoaneurysm repair with vascular surgery (Dr. Lonni Gaskins). He is POD 0. Vasc surg wanted FMTS to admit the patient with complex medical history which includes PAD s/p aortobifemoral bypass with subsequent repair, hemorrhagic stroke w/ R hemiparesis, CHF (normal EF per echo 08/2023), COPD, HTN, HLD, T2DM, hx A fib (in setting of sepsis), CKD 3a, seizure history. Vascular to continue following along to provide recommendations. Assessment & Plan Pseudoaneurysm of femoral artery following procedure (HCC) POD 0 s/p right femoral artery pseudoaneurysm repair. Wound VAC present in right groin. Pain endorsing moderate right groin pain post-surgery. - Admitted to FMTS inpatient service, attending Dr. McDiarmid - Vascular surgery following - Dispo: Progressive - Antibiotics: IV Vancomycin  (9/22- ), IV Zosyn  (9/22- ) - MRSA negative, staph aureus positive on surgical PCR screen - Pain: Dilaudid  0.5 mg IV Q3H prn for severe pain, Oxycodone  5 mg q6h for moderate pain, and Tylenol  650 mg Q4H for mild pain - Bowel regimen: Miralax  and Senna daily prn for constipation - Wound culture: pending - Labs: AM CBC, BMP, lipid panel PAD (peripheral artery disease) Extensive history of surgical repair (bypass grafts); most recently above. - Crestor  10 mg daily; can consider increase to 20 or 40 for high intensity therapy if lipids elevated Chronic health problem COPD -  4L O2 via [place] at baseline. Home regimen of Trelegy Ellipta  1 puff daily (on Breztri  formulary equivalent) A fib - Rate control with carvedilol  6.25 mg twice daily. Hold home Eliquis  5 mg twice daily in the setting of being on heparin  HTN - Continue home carvedilol  6.25 mg BID; Hold home regimen of amlodipine  5 mg daily, hydralazine  25 mg daily while borderline normo-hypertensive HLD - Home regimen of Crestor  10 mg daily, lipid panel in AM T2DM - Last A1c 4 months ago, 5.8. Home regimen of Insulin  5u daily in the AM. Will see how he tolerates PO and monitor CBGs w/ SSI and add Lantus  prn. HFpEF - Last echo 08/2023 with EF of 55-60%. Home regimen of carvedilol  6.25 mg twice daily Hx seizure - Home regimen of Keppra  500 mg twice daily  Anxiety/Depression - Home regimen of mirtazapine  7.5 mg daily  GERD - Home regimen of Protonix  40 mg daily  Neuropathy - Home gabapentin  300 mg AM + 600 mg PM  FEN/GI: Regular diet VTE Prophylaxis: Heparin  5000 units Q8H; can consider restarting home Eliquis  after 24 hours if fine per vascular  Disposition: Progressive  History of Present Illness:  Kyle [name] is a 73 y.o. male following right femoral pseudoaneurysm repair with vascular surgery. Patient seen post-op. Last Tuesday, the pain in the right groin was very severe and the patient came to the ED and vascular was consulted and recommended outpatient follow up during which they decided they would pursue surgical intervention today.  Patient reports chronic R-sided weakness and is not able to ambulate. Uses a wheelchair. Denies SOB, coughing or orthopnea. Uses 4 L O2 at home. No increase in oxygen  requirement. Denies headache,  chest pain, shortness of breath abdominal pain, nausea, vomiting, diarrhea, leg pain, numbness of tingling, dysuria, increased frequency, or urgency, regular bowel movements (last BM Saturday). Endorses right groin pain.  Review Of Systems: Per HPI.  Pertinent Past Medical History: -  PAD s/p aortobifemoral bypass with subsequent repair - Hemorrhagic stroke - CHF (normal EF per echo 08/2023) - Emphysema - HTN - HLD - T2DM - A fib (in setting of sepsis) - CKD 3a - Hx seizure Remainder reviewed in history tab.   Pertinent Past Surgical History: - Right femoral to popliteal artery bypass in 2010 - Aortobifemoral bypass graft in 2016  - Right femoral artery pseudoaneurysm repair in 2020 - S/p right pseudoaneurysm repair 07/17/24 Remainder reviewed in history tab.   Pertinent Social History: Tobacco use: Former (quit 09/2018) Alcohol use: None Other Substance use: None Lives with his wife Uses a motorized wheelchair at baseline 2/2 severe right-sided deficits post-CVA  Pertinent Family History: - Mom: DM - Sister: HTN, HLD  Important Outpatient Medications: - Amlodipine  5 mg daily - Hydralazine  25 mg TID - Carvedilol  6.25 mg BID - Eliquis  5 mg BID - not taken since 9/16 - Tresiba  5u daily in the AM - Novolog  SSI - Crestor  10 mg daily - Keppra  500 mg BID - Gabapentin  300 mg in the AM/PM, 600 mg daily in the PM - Imdur  30 mg daily - Mirtazapine  7.5 mg daily - Mucinex  600 mg BID - MVI daily - Protonix  40 mg daily - Miralax  prn - Trelegy Ellipta  1 puff daily - Albuterol  prn   Objective: BP (!) 137/51 (BP Location: Left Arm)   Pulse (!) 55   Temp 97.9 F (36.6 C)   Resp 12   Ht 5' 11 (1.803 m)   Wt 68.9 kg   SpO2 92%   BMI 21.19 kg/m   Exam: General: resting in bed, NAD Eyes: no scleral icterus; EOM grossly intact ENTM: supple Neck: ROM intact Cardiovascular: regular rate and rhythm; normal S1/S2 Respiratory: clear to auscultation bilaterally; normal respiratory effort on 4 L O2 Gastrointestinal: soft, non-distended, non-tender; normal bowel sounds MSK: Right arm and right leg 0/5 strength, unable to lift independently on exam; Left arm and leg normal strength and ROM; sensation intact bilaterally Derm: warm and dry Neuro: A&Ox4. R  sided hemiparesis s/p CVA.  Psych: pleasant, euthymic, appropriate affect  Labs:  CBC BMET  Recent Labs  Lab 07/10/24 1650 07/17/24 0943 07/17/24 0953  WBC 7.3  --   --   HGB 10.7*   < > 11.6*  HCT 33.2*   < > 34.0*  PLT 197  --   --    < > = values in this interval not displayed.   Recent Labs  Lab 07/10/24 1650 07/17/24 0943 07/17/24 0953  NA 142   < > 146*  K 4.0   < > 4.0  CL 107   < > 112*  CO2 25  --   --   BUN 26*   < > 21  CREATININE 1.40*   < > 1.30*  GLUCOSE 254*   < > 122*  CALCIUM  8.9  --   --    < > = values in this interval not displayed.     Surgical PCR Screen: Negative MRSA, positive Staph aureus  Mannie Crome, MD 07/17/2024, 3:08 PM PGY-1, [facility]  FPTS Intern pager: [phone], text pages welcome Secure chat group [facility] Teaching Service   I have discussed the  above with Dr. Mannie and agree with the documented plan. My edits for correction/addition/clarification are included above. Please see any attending notes.   Kathrine Melena, DO PGY-2, [facility] 07/17/2024 6:29 PM

## 2024-07-17 NOTE — Assessment & Plan Note (Addendum)
 Extensive history of surgical repair (bypass grafts); most recently above. - Crestor  10 mg daily; can consider increase to 20 or 40 for high intensity therapy if lipids elevated

## 2024-07-17 NOTE — Hospital Course (Addendum)
 Kyle [name] is a 73 y.o. year old with a history of PAD s/p aortobifemoral bypass with subsequent repair, hemorrhagic stroke, CHF (normal EF per echo 08/2023), COPD, HTN, HLD, T2DM, hx A fib (in setting of sepsis), CKD 3a, hx seizure who was admitted to the [facility] Medicine Teaching Service for medical management of comorbidities following right femoral pseudoaneurysm repair.  S/p Right Femoral Pseudoaneurysm Repair Patient underwent right femoral pseudoaneurysm repair on 07/17/24. He has a history of remote aortobifemoral bypass with vascular surgery, who consulted throughout patient's admission. Post-operatively, heparin  was started and home Eliquis  held and planing on restarting on Monday 07/24/24 after first NPWT change by Vascular surgery. Patient initially started on vancomycin  (9/22-9/24) and Zosyn  (9/22-9/28) empirically. Vancomycin  discontinued as patient found to have AKI, and was replaced with Linezolid  (9/24-9/28). Blood cultures with no growth and so antibiotics discontinued on 9/28. VAC changed 9/29.  Acute left hemiparesis On POD 2, Code Stroke called as patient had AMS, L sided deficits, tachycardia, and was febrile to 103 F. CT Head negative for acute stroke. CTA with no acute findings, high grade stenosis of V4 and R ICA stenosis of 50%.  CTPE showed no evidence of PE. MRI brain with no acute intracranial abnormalities. Patient returned to baseline mental status, afebrile, moving left extremities normally, VSS later the same day. Neurology consulted, thought episode was most likely encephalopathy due to medication effect from Dilaudid  or febrile seizure. Recommended considering increasing Keppra  dose to 750 mg BID if patient had another episode--however he remained asymptomatic and so home dose was continued.  COPD Acute hypoxemic respiratory failure Patient on home regimen of 4L O2 via [place]. Initially placed back on [place] at 4-6L post-op. However, on 9/23, desatted to 60s and required HFNC at  12L. CXR, EKG unremarkable at this time and Trop of 23 and 26. Breathing later improved and patient returned to baseline O2 needs on 9/26. Incentive spirometry used throughout admission.  PAD Extensive history of surgical repair with bypass grafts. Kept on Crestor  10 mg daily while inpatient; lipid panel checked and LDL under good control at 53.   Other chronic conditions were medically managed with home medications and formulary alternatives as necessary (A fib, HTN, HLD, T2DM, HFpEF, hx seizure, hx CVA, anxiety/depression, GERD, neuropathy).  PCP Follow-up Recommendations: Follow-up with vascular Consider increasing Crestor  to high intensity dosage Consider neurology follow up for Sequelae of prior hemorrhage at the left lentiform nucleus with residual hemosiderin staining and associated wallerian degeneration.

## 2024-07-17 NOTE — Progress Notes (Signed)
 Patient arrived to 4E, ao x4. Breathing even and unlabored in 5l o2. CHG completed, oriented pt to room and call bell sytem. Connected to tele, CCMD notified. Call bell within reach. Plan of care continues.

## 2024-07-17 NOTE — Progress Notes (Signed)
 Pharmacy Antibiotic Note  Kyle [name] is a 73 y.o. male admitted on 07/17/2024 with R groin infection.  Pharmacy has been consulted for vancomycin  and zosyn  dosing.  Plan: Zosyn  3.375gm IV q8h (4hr extended infusions) Vancomycin  1250mg  IV q24h for estimated AUC 505 using SCr 1.3 Check vancomycin  levels at steady state, goal AUC 400-550 Follow up renal function, cultures as available, clinical progress, length of tx  Height: 5' 11 (180.3 cm) Weight: 68.9 kg (151 lb 14.4 oz) IBW/kg (Calculated) : 75.3  Temp (24hrs), Avg:98 F (36.7 C), Min:97.9 F (36.6 C), Max:98 F (36.7 C)  Recent Labs  Lab 07/10/24 1650 07/17/24 0943 07/17/24 0953  WBC 7.3  --   --   CREATININE 1.40* 0.80 1.30*    Estimated Creatinine Clearance: 50.1 mL/min (A) (by C-G formula based on SCr of 1.3 mg/dL (H)).    No Known Allergies  Antimicrobials this admission: Cefazolin  x 1 9/22 Vancomycin  9/22 >> Zosyn  9/22 >>  Dose adjustments this admission:  Microbiology results: 9/22 Tissue:  Thank you for allowing pharmacy to be a part of this patient's care.  Rocky Slade, PharmD, BCPS 07/17/2024 4:38 PM

## 2024-07-17 NOTE — Op Note (Signed)
 Date: July 17, 2024  Preoperative diagnosis:  Right femoral pseudoaneurysm  Prior aortobifemoral bypass and right common femoral to peroneal artery bypass  Postoperative diagnosis: Same  Procedure: 1.  Repair of right common femoral pseudoaneurysm 2.  Right common femoral artery to profunda bypass with 8 mm dacron graft 3.  Revision of right common femoral to peroneal vein graft with new proximal anastomosis to dacron interposition 4. Right lower extremity thrombectomy (#3 fogarty) 5.  Sartorius muscle flap right groin for coverage of dacron graft 6.  15 French drain placement right sartorius muscle bed 7.  Wound VAC right groin wound  Surgeon: Dr. Lonni DOROTHA Gaskins, MD  Assistant: Dr. Fonda Cheryle Rim, MD and Lucie Apt, [place]  Indications: 73 year old male previously underwent aortobifemoral bypass 2016.  In 2020 he underwent repair of right femoral pseudoaneurysm with a right femoral to peroneal artery bypass with vein by Dr. Oris.  Recent imaging in the ED showed enlarging pseudoaneurysm.  He presents for repair after risk benefits discussed and an assistant was needed given the complexity of the case and repair of the pseudoaneurysm.  Findings: Large pseudoaneurysm in the right groin where the dacron limb was disrupted from the common femoral artery laterally.  Ultimately we got proximal control of the right limb of the aortobifemoral bypass and then found the large profunda.  After resection of the pseudoaneurysm we performed a bypass from the right limb of the aortobifemoral limb to the profunda with a 8 mm dacryon graft.  I then reanastomosed the proximal hood of the common femoral to peroneal artery vein graft to the dacron graft.  Sartorius muscle flap was then placed in the right groin with lateral mobilization of the muscle.  Put a 15 French drain in the right sartorius muscle bed with a VAC over the sartorius muscle after we closed some of the subcutaneous tissue.   Assistant was needed given the complexity the case and also for getting control of the inflow and outflow for the vascular reconstruction.  Anesthesia: General  EBL: 1200 mL - 2 upRBCs transfused  Details: Patient was taken to the operating room after informed consent was obtained.  Placed on the operative table in the supine position.  General endotracheal anesthesia was induced.  The abdominal wall right groin were then prepped and draped in standard sterile fashion.  Antibiotics were given and timeout performed.  Initially made a vertical groin incision in the right groin at his previous surgical incision.  Initially went proximal to the pseudoaneurysm up to the inguinal ligament dissect out that the right limb of the aortobifemoral bypass graft and controlled this with a angled DeBakey clamp.  Patient was given 100 units/kg IV heparin .  I then carried the dissection more distal and got to the distal extent of the pseudoaneurysm cavity.  We then circumferentially mobilized around the capsule of the pseudoaneurysm.  I found the common femoral to peroneal vein bypass distally in the thigh and this was controlled with a vessel loop.  Ultimately that point in time we then controlled the right limb of the aortobifem graft and then went and entered the pseudoaneurysm cavity.  There was a large thrombus that was evacuated.  Continued to dissect distally until I found the profunda that was then controlled with Vesseloops after tedious dissection due to scar tissue.  The whole lateral wall of the previous dacryon graft was disrupted.  Ultimately I resected all the nonviable tissue and thrombus.  Ultimately I brought a new dacron graft on  the field.  We sewed a new 8 mm dacron graft end to end to the right limb of the aortobifemoral bypass with 5-0 Prolene paraachute technqiue with the help of my assistant.  I then sewed this graft end to end with 5-0 Prolene to the main profunda branch after this was spatulated with  the help of Dr. Lanis.  This was de-aired prior to completion.  We had excellent Doppler signals in the profunda.  I then was able to reconstruct the hood of the common femoral to peroneal bypass graft where it been previously spatulated.  This was intact.  I recontrolled the dacryon limb in the right groin and used a 11 blade scalpel with aortic punch.  I then sewed a new end to side anastomosis of the common femoral to peroneal vein graft to the right limb of the dacryon graft with 5-0 Prolene parachute technique.  I did pass a #3 Fogarty all the way down to the foot through the bypass as initially we did not have any backbleeding.  We had excellent Doppler flow in the profunda and femoral to peroneal bypass at this time.  We checked the foot and had good Doppler signal.  Protamine  was given for reversal.  I then went lateral to the muscle in the right groin and mobilized the sartorius all the way up to ASIS where it was disconnected.  I then was able to roll the muscle over the dacron interposition and I tacked this down with multiple 2-0 Vicryl figure-of-eight sutures.  I then was able to place a 15 [place] drain into the sartorius muscle bed and this was secured with a 2-0 nylon.  I closed some of the subcutaneous tissue although this was thin.  Ultimately I did not feel like close anymore and ended up electing to place a VAC over the sartorius muscle.  This small black sponge was cut to fit the wound bed and then we applied adhesive and suction.  Ultimately patient was awakened taken to recovery in stable condition.  Excellent peroneal doppler signal right foot at completion.  Complication: None  Condition: Stable  Kyle DOROTHA Gaskins, MD [facility] Office: [phone]   Kyle [name]

## 2024-07-17 NOTE — Transfer of Care (Signed)
 Immediate Anesthesia Transfer of Care Note  Patient: Kyle [name]  Procedure(s) Performed: REPAIR OF RIGHT PSEUDOANEURYSM (Right: Groin) SARTORIOUS MUSCLE FLAP CLOSURE OF RIGHT GROIN (Right: Groin) BYPASS GRAFT COMMON FEMORAL TO PROFUNDA BYPASS USING HEMASHEILD GOLD X 30CM (Right: Groin) APPLICATION, WOUND VAC TO RIGHT GROIN (Right: Groin) REVISION OF RIGHT BYPASS ARTERIAL FEMORAL TO PERONEAL (Right: Groin)  Patient Location: PACU  Anesthesia Type:General  Level of Consciousness: awake, alert , and oriented  Airway & Oxygen  Therapy: Patient Spontanous Breathing and Patient connected to face mask oxygen   Post-op Assessment: Report given to RN and Post -op Vital signs reviewed and stable  Post vital signs: Reviewed and stable  Last Vitals:  Vitals Value Taken Time  BP 145/66 07/17/24 14:34  Temp    Pulse 55 07/17/24 14:42  Resp 13 07/17/24 14:42  SpO2 96 % 07/17/24 14:42  Vitals shown include unfiled device data.  Last Pain:  Vitals:   07/17/24 0859  TempSrc: Oral  PainSc: 0-No pain         Complications: No notable events documented.

## 2024-07-17 NOTE — Assessment & Plan Note (Addendum)
 COPD - 4L O2 via [place] at baseline. Home regimen of Trelegy Ellipta  1 puff daily (on Breztri  formulary equivalent) A fib - Rate control with carvedilol  6.25 mg twice daily. Hold home Eliquis  5 mg twice daily in the setting of being on heparin  HTN - Continue home carvedilol  6.25 mg BID; Hold home regimen of amlodipine  5 mg daily, hydralazine  25 mg daily while borderline normo-hypertensive HLD - Home regimen of Crestor  10 mg daily, lipid panel in AM T2DM - Last A1c 4 months ago, 5.8. Home regimen of Insulin  5u daily in the AM. Will see how he tolerates PO and monitor CBGs w/ SSI and add Lantus  prn. HFpEF - Last echo 08/2023 with EF of 55-60%. Home regimen of carvedilol  6.25 mg twice daily Hx seizure - Home regimen of Keppra  500 mg twice daily  Anxiety/Depression - Home regimen of mirtazapine  7.5 mg daily  GERD - Home regimen of Protonix  40 mg daily  Neuropathy - Home gabapentin  300 mg AM + 600 mg PM

## 2024-07-17 NOTE — Anesthesia Procedure Notes (Signed)
 Central Venous Catheter Insertion Performed by: Darlyn Rush, MD, anesthesiologist Start/End9/22/2025 9:45 AM, 07/17/2024 10:00 AM Patient location: Pre-op. Preanesthetic checklist: patient identified, IV checked, site marked, risks and benefits discussed, surgical consent, monitors and equipment checked, pre-op evaluation, timeout performed and anesthesia consent Position: Trendelenburg Lidocaine  1% used for infiltration and patient sedated Hand hygiene performed , maximum sterile barriers used  and Seldinger technique used Catheter size: 8.5 Fr Sheath introducer Procedure performed using ultrasound guided technique. Ultrasound Notes:anatomy identified, needle tip was noted to be adjacent to the nerve/plexus identified, no ultrasound evidence of intravascular and/or intraneural injection and image(s) printed for medical record Attempts: 1 Following insertion, line sutured, dressing applied and Biopatch. Post procedure assessment: blood return through all ports, free fluid flow and no air  Patient tolerated the procedure well with no immediate complications. Additional procedure comments: Triple lumen catheter placed through the introducer port.SABRA

## 2024-07-17 NOTE — H&P (Signed)
 History and Physical Interval Note:  07/17/2024 9:28 AM  Kyle [name]  has presented today for surgery, with the diagnosis of pseudoaneurysm R femoral artery.  The various methods of treatment have been discussed with the patient and family. After consideration of risks, benefits and other options for treatment, the patient has consented to  Procedure(s) with comments: REPAIR, PSEUDOANEURYSM (Right) - R GROIN RECONSTRUCTION WITH MUSCLE FLAP EXPLORATION, ARTERY (Right) - R GROIN RECONSTRUCTION WITH MUSCLE FLAP as a surgical intervention.  The patient's history has been reviewed, patient examined, no change in status, stable for surgery.  I have reviewed the patient's chart and labs.  Questions were answered to the patient's satisfaction.    Plan right femoral pseudoaneurysm repair in setting of remote aortobifemoral bypass.  Also has a right femoral to peroneal bypass in the right groin.  Very high surgical risk due to his comorbidities and debilitated state.  Family has elected to proceed.  All questions answered.  Discussed multiple risk factors including wound healing problems bleeding infection and ultimately limb loss.  Lonni Kyle [name]       Office Note     History of Present Illness    Kyle [name] is a 73 y.o. (07-Jul-1951) male who presents for surveillance with history of aortobifemoral bypass graft in 2016.  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  In 2020 he had a right femoral artery pseudoaneurysm repair with right femoral to peroneal artery bypass with greater saphenous vein.  During surveillance in 2022 he was found to have elevated velocities of 437cm/s at the distal anastomosis of his femoral-peroneal bypass.  This was investigated via angiogram but no significant stenosis was found.  It was suggested if his velocities ever significantly increased he may require repeat angiogram versus surgical revision.    [name] was seen in the hospital status post stroke with a small pseudoaneurysm at the previous right femoral artery revision site.  He did not want to move forward with invasive surgery, electing to hopefully recover from his stroke.   He was seen in the outpatient setting, and had similar sentiments. Most recently, he was seen in the ED with new onset pain in the right groin.  The pseudoaneurysm was found to have enlarged from previous size.  He was sent to my office for follow-up.   On exam, Kyle [name] was accompanied by his wife.  He uses a motorized wheelchair at baseline with continued severe right sided deficits.  He is not ambulatory, but is determined to walk again.  Of note, he requires 4 L nasal cannula oxygen  at baseline.   Denies symptoms of ischemic rest pain, tissue loss in the feet         Current Outpatient Medications  Medication Sig Dispense Refill   Accu-Chek FastClix Lancets MISC Inject 102 each into the skin 3 (three) times daily. 1 each 6   ACCU-CHEK GUIDE TEST test strip 1 each by Other route 3 (three) times daily.       acetaminophen  (TYLENOL ) 325 MG tablet Take 2 tablets (650 mg total) by mouth every 4 (four) hours as needed for mild pain (pain score 1-3).       albuterol  (PROVENTIL ) (2.5 MG/3ML) 0.083% nebulizer solution Take 3 mLs (2.5 mg total) by nebulization every 4 (four) hours as needed for wheezing or shortness of breath.       amLODipine  (NORVASC ) 5 MG tablet Take 5 mg by mouth  at bedtime.       apixaban  (ELIQUIS ) 5 MG TABS tablet Take 1 tablet (5 mg total) by mouth 2 (two) times daily. 180 tablet 3   carvedilol  (COREG ) 6.25 MG tablet Take 1 tablet (6.25 mg total) by mouth 2 (two) times daily with a meal. 180 tablet 3   DROPLET PEN NEEDLES 32G X 4 MM MISC USE AS DIRECTED WITH INSULIN  300 each 3   Fluticasone -Umeclidin-Vilant (TRELEGY ELLIPTA ) 200-62.5-25 MCG/ACT AEPB INHALE 1 PUFF EVERY DAY (Patient taking differently: Inhale 1 puff into the lungs in the morning.)  180 each 0   gabapentin  (NEURONTIN ) 300 MG capsule Take 1 capsule (300 mg total) by mouth daily. In the morning or afternoon 30 capsule 3   gabapentin  (NEURONTIN ) 600 MG tablet Take 1 tablet (600 mg total) by mouth at bedtime. 90 tablet 0   hydrALAZINE  (APRESOLINE ) 25 MG tablet Take 1 tablet (25 mg total) by mouth in the morning, at noon, and at bedtime. 270 tablet 0   isosorbide  mononitrate (IMDUR ) 30 MG 24 hr tablet Take 1 tablet (30 mg total) by mouth daily. 90 tablet 3   levETIRAcetam  (KEPPRA ) 500 MG tablet Take 1 tablet (500 mg total) by mouth 2 (two) times daily. 180 tablet 0   mirtazapine  (REMERON ) 7.5 MG tablet Take 1 tablet (7.5 mg total) by mouth at bedtime. 90 tablet 3   MUCINEX  600 MG 12 hr tablet Take 600 mg by mouth 2 (two) times daily.       Multiple Vitamins-Minerals (CENTRUM MEN) TABS Take 1 tablet by mouth daily with breakfast.       NOVOLOG  FLEXPEN 100 UNIT/ML FlexPen Inject 0-10 Units into the skin See admin instructions. Inject 0-10 units into the skin three times a day before meals, per Sliding Scale: BGL 0-59 OR 400-1,000 = CALL MD; 60-150 = give nothing; 151-199 = 2 units; 200-249 = 4 units; 250-299 = 6 units; 300-349 = 8 units; 350-399 = 10 units       OXYGEN  Inhale 4 L/min into the lungs continuous.       pantoprazole  (PROTONIX ) 40 MG tablet Take 1 tablet (40 mg total) by mouth daily. 90 tablet 0   polyethylene glycol (MIRALAX  / GLYCOLAX ) 17 g packet Take 17 g by mouth 2 (two) times daily as needed for mild constipation or moderate constipation.       rosuvastatin  (CRESTOR ) 10 MG tablet Take 1 tablet (10 mg total) by mouth at bedtime. 90 tablet 3   TRESIBA  FLEXTOUCH 100 UNIT/ML FlexTouch Pen Inject 5 Units into the skin in the morning.          No current facility-administered medications for this visit.        REVIEW OF SYSTEMS (negative unless checked):    Cardiac:  []  Chest pain or chest pressure? []  Shortness of breath upon activity? []  Shortness of breath when  lying flat? []  Irregular heart rhythm?   Vascular:  []  Pain in calf, thigh, or hip brought on by walking? []  Pain in feet at night that wakes you up from your sleep? []  Blood clot in your veins? []  Leg swelling?   Pulmonary:  []  Oxygen  at home? []  Productive cough? []  Wheezing?   Neurologic:  []  Sudden weakness in arms or legs? []  Sudden numbness in arms or legs? []  Sudden onset of difficult speaking or slurred speech? []  Temporary loss of vision in one eye? []  Problems with dizziness?   Gastrointestinal:  []  Blood in stool? []  Vomited blood?  Genitourinary:  []  Burning when urinating? []  Blood in urine?   Psychiatric:  []  Major depression   Hematologic:  []  Bleeding problems? []  Problems with blood clotting?   Dermatologic:  []  Rashes or ulcers?   Constitutional:  []  Fever or chills?   Ear/Nose/Throat:  []  Change in hearing? []  Nose bleeds? []  Sore throat?   Musculoskeletal:  []  Back pain? []  Joint pain? []  Muscle pain?     Physical Examination    There were no vitals filed for this visit.   There is no height or weight on file to calculate BMI.   General: Severe decline since last seen, bedridden Gait: Not observed HENT: WNL, normocephalic Pulmonary: normal non-labored breathing , without rales, rhonchi,  wheezing Cardiac: regular Abdomen: soft, NT, no masses Skin: without rashes Vascular Exam/Pulses: Multiphasic peroneal signal, multiphasic DP signal Pedal pulse in the right groin, no significant size change on physical exam Extremities: without ischemic changes, without gangrene , without cellulitis; without open wounds;  Musculoskeletal: no muscle wasting or atrophy       Neurologic: Alert and oriented, dysphagia, severe deficits in the right arm and right leg.  Severe neuropathic pain in both. Psychiatric:  The pt has Normal affect.         Medical Decision Making    Kyle [name] is a 73 y.o. male who presents in follow-up  regarding right groin pseudoaneurysm with history of AVF limb revision and bypass.     The aneurysm has increased in size. Regarding Bertin's overall clinical status, little has changed.  He is still nonambulatory with severe right sided deficits, and has a 4 L oxygen  requirement at baseline.   I had a very long discussion with Khyson and his wife regarding his right groin pseudoaneurysm.  There is no question that he is a very poor surgical candidate due to his comorbidities, with my major concern being pulmonary insufficiency.   He is aware that revision of the right groin will require interposition bypass, muscle flap.  He is aware that this could cause bypass thrombosis.  I am concerned that postoperatively, he can have severe respiratory compromise and we discussed if he would be amenable to tracheostomy if 1 were needed.  He stated he would not want a tracheostomy which upset his wife significantly.  We also discussed that if his bypass occludes, he would likely require lower extremity amputation down the road in the form of above-knee amputation.  Again, both he and his wife are very upset having to listen to these risks.   After a significant amount of discussion, Evon elected to proceed with surgery.  I think that unfortunately it is necessary as continued pseudoaneurysm growth will put him in a life-threatening situation.  I have discussed his case with my partner Dr. Gretta, who has time on Monday.  He has graciously offered to help. I have also blocked my schedule as well.   After discussing the risks and benefits of right groin reconstruction, muscle flap for pseudoaneurysm, Taelyn elected to proceed.

## 2024-07-18 ENCOUNTER — Encounter (HOSPITAL_COMMUNITY): Payer: Self-pay | Admitting: Family Medicine

## 2024-07-18 ENCOUNTER — Inpatient Hospital Stay (HOSPITAL_COMMUNITY)

## 2024-07-18 DIAGNOSIS — Z794 Long term (current) use of insulin: Secondary | ICD-10-CM

## 2024-07-18 DIAGNOSIS — L89312 Pressure ulcer of right buttock, stage 2: Secondary | ICD-10-CM | POA: Diagnosis not present

## 2024-07-18 DIAGNOSIS — T300 Burn of unspecified body region, unspecified degree: Secondary | ICD-10-CM

## 2024-07-18 DIAGNOSIS — E1159 Type 2 diabetes mellitus with other circulatory complications: Secondary | ICD-10-CM | POA: Diagnosis not present

## 2024-07-18 DIAGNOSIS — E1169 Type 2 diabetes mellitus with other specified complication: Secondary | ICD-10-CM

## 2024-07-18 DIAGNOSIS — I152 Hypertension secondary to endocrine disorders: Secondary | ICD-10-CM

## 2024-07-18 DIAGNOSIS — L89302 Pressure ulcer of unspecified buttock, stage 2: Secondary | ICD-10-CM | POA: Diagnosis present

## 2024-07-18 DIAGNOSIS — I724 Aneurysm of artery of lower extremity: Secondary | ICD-10-CM

## 2024-07-18 LAB — GLUCOSE, CAPILLARY
Glucose-Capillary: 106 mg/dL — ABNORMAL HIGH (ref 70–99)
Glucose-Capillary: 218 mg/dL — ABNORMAL HIGH (ref 70–99)
Glucose-Capillary: 169 mg/dL — ABNORMAL HIGH (ref 70–99)
Glucose-Capillary: 140 mg/dL — ABNORMAL HIGH (ref 70–99)

## 2024-07-18 LAB — POCT I-STAT 7, (LYTES, BLD GAS, ICA,H+H)
Acid-Base Excess: 0 mmol/L (ref 0.0–2.0)
Acid-base deficit: 2 mmol/L (ref 0.0–2.0)
Acid-base deficit: 2 mmol/L (ref 0.0–2.0)
Bicarbonate: 25.9 mmol/L (ref 20.0–28.0)
Bicarbonate: 24.3 mmol/L (ref 20.0–28.0)
Bicarbonate: 24.6 mmol/L (ref 20.0–28.0)
Calcium, Ion: 1.25 mmol/L (ref 1.15–1.40)
Calcium, Ion: 1.17 mmol/L (ref 1.15–1.40)
Calcium, Ion: 1.2 mmol/L (ref 1.15–1.40)
HCT: 33 % — ABNORMAL LOW (ref 39.0–52.0)
HCT: 30 % — ABNORMAL LOW (ref 39.0–52.0)
HCT: 30 % — ABNORMAL LOW (ref 39.0–52.0)
Hemoglobin: 11.2 g/dL — ABNORMAL LOW (ref 13.0–17.0)
Hemoglobin: 10.2 g/dL — ABNORMAL LOW (ref 13.0–17.0)
Hemoglobin: 10.2 g/dL — ABNORMAL LOW (ref 13.0–17.0)
O2 Saturation: 98 %
O2 Saturation: 96 %
O2 Saturation: 99 %
Patient temperature: 35.4
Patient temperature: 35.6
Potassium: 3.8 mmol/L (ref 3.5–5.1)
Potassium: 4.1 mmol/L (ref 3.5–5.1)
Potassium: 4.2 mmol/L (ref 3.5–5.1)
Sodium: 145 mmol/L (ref 135–145)
Sodium: 145 mmol/L (ref 135–145)
Sodium: 146 mmol/L — ABNORMAL HIGH (ref 135–145)
TCO2: 27 mmol/L (ref 22–32)
TCO2: 26 mmol/L (ref 22–32)
TCO2: 26 mmol/L (ref 22–32)
pCO2 arterial: 44.8 mmHg (ref 32–48)
pCO2 arterial: 45.5 mmHg (ref 32–48)
pCO2 arterial: 48.8 mmHg — ABNORMAL HIGH (ref 32–48)
pH, Arterial: 7.362 (ref 7.35–7.45)
pH, Arterial: 7.311 — ABNORMAL LOW (ref 7.35–7.45)
pH, Arterial: 7.329 — ABNORMAL LOW (ref 7.35–7.45)
pO2, Arterial: 98 mmHg (ref 83–108)
pO2, Arterial: 133 mmHg — ABNORMAL HIGH (ref 83–108)
pO2, Arterial: 81 mmHg — ABNORMAL LOW (ref 83–108)

## 2024-07-18 LAB — BASIC METABOLIC PANEL WITH GFR
Anion gap: 11 (ref 5–15)
BUN: 24 mg/dL — ABNORMAL HIGH (ref 8–23)
CO2: 23 mmol/L (ref 22–32)
Calcium: 8.6 mg/dL — ABNORMAL LOW (ref 8.9–10.3)
Chloride: 110 mmol/L (ref 98–111)
Creatinine, Ser: 1.65 mg/dL — ABNORMAL HIGH (ref 0.61–1.24)
GFR, Estimated: 44 mL/min — ABNORMAL LOW (ref >60)
Glucose, Bld: 121 mg/dL — ABNORMAL HIGH (ref 70–99)
Potassium: 4.4 mmol/L (ref 3.5–5.1)
Sodium: 144 mmol/L (ref 135–145)

## 2024-07-18 LAB — POCT I-STAT, CHEM 8
BUN: 13 mg/dL (ref 8–23)
Calcium, Ion: 0.89 mmol/L — CL (ref 1.15–1.40)
Chloride: 120 mmol/L — ABNORMAL HIGH (ref 98–111)
Creatinine, Ser: 0.8 mg/dL (ref 0.61–1.24)
Glucose, Bld: 85 mg/dL (ref 70–99)
HCT: 25 % — ABNORMAL LOW (ref 39.0–52.0)
Hemoglobin: 8.5 g/dL — ABNORMAL LOW (ref 13.0–17.0)
Potassium: 2.6 mmol/L — CL (ref 3.5–5.1)
Sodium: 152 mmol/L — ABNORMAL HIGH (ref 135–145)
TCO2: 15 mmol/L — ABNORMAL LOW (ref 22–32)

## 2024-07-18 LAB — CBC
HCT: 32.1 % — ABNORMAL LOW (ref 39.0–52.0)
Hemoglobin: 10.4 g/dL — ABNORMAL LOW (ref 13.0–17.0)
MCH: 27.9 pg (ref 26.0–34.0)
MCHC: 32.4 g/dL (ref 30.0–36.0)
MCV: 86.1 fL (ref 80.0–100.0)
Platelets: 182 K/uL (ref 150–400)
RBC: 3.73 MIL/uL — ABNORMAL LOW (ref 4.22–5.81)
RDW: 24.8 % — ABNORMAL HIGH (ref 11.5–15.5)
WBC: 14.9 K/uL — ABNORMAL HIGH (ref 4.0–10.5)
nRBC: 0 % (ref 0.0–0.2)

## 2024-07-18 LAB — POCT ACTIVATED CLOTTING TIME
Activated Clotting Time: 256 s
Activated Clotting Time: 210 s
Activated Clotting Time: 210 s

## 2024-07-18 LAB — LIPID PANEL
Cholesterol: 99 mg/dL (ref 0–200)
HDL: 30 mg/dL — ABNORMAL LOW (ref >40)
LDL Cholesterol: 53 mg/dL (ref 0–99)
Total CHOL/HDL Ratio: 3.3 ratio
Triglycerides: 79 mg/dL (ref <150)
VLDL: 16 mg/dL (ref 0–40)

## 2024-07-18 LAB — TROPONIN I (HIGH SENSITIVITY)
Troponin I (High Sensitivity): 23 ng/L — ABNORMAL HIGH (ref <18)
Troponin I (High Sensitivity): 26 ng/L — ABNORMAL HIGH (ref <18)

## 2024-07-18 LAB — BRAIN NATRIURETIC PEPTIDE: B Natriuretic Peptide: 127.5 pg/mL — ABNORMAL HIGH (ref 0.0–100.0)

## 2024-07-18 MED ORDER — LEVETIRACETAM 500 MG PO TABS
500.0000 mg | ORAL_TABLET | Freq: Two times a day (BID) | ORAL | Status: DC
  Administered 2024-07-18 – 2024-07-25 (×14): 500 mg via ORAL
  Filled 2024-07-18 (×14): qty 1
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
## 2024-07-18 NOTE — Evaluation (Signed)
 Occupational Therapy Evaluation Patient Details Name: Kyle [name] MRN: [id number] DOB: 1950-11-10 Today's Date: 07/18/2024   History of Present Illness   pt is a 73 yo male admitted for repair of R com fem pseudoaneurysm wit hR CFA to profunda bypass and revsion of R DVA to peroneal vein graft, R LLE thrombectomy and sartorius ms. flap with placement of R wound vac.  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     Clinical Impressions Pt admitted with the above diagnosis  and has the deficits outlined below. Pt would benefit from cont OT to increase independence with basic adls back to his baseline. Pt lives with wife in an apartment and has received care for all adls and mobility since stroke last year.  Wife is home with pt most of the time and can provide care.  Pt overall mod to max assist with most adls at this time.  Pt would benefit from cont HHOT at d/c.      If plan is discharge home, recommend the following:   A lot of help with bathing/dressing/bathroom;A lot of help with walking and/or transfers;Assistance with cooking/housework;Assistance with feeding;Direct supervision/assist for medications management;Direct supervision/assist for financial management;Assist for transportation;Help with stairs or ramp for entrance;Supervision due to cognitive status     Functional Status Assessment   Patient has had a recent decline in their functional status and demonstrates the ability to make significant improvements in function in a reasonable and predictable amount of time.     Equipment Recommendations   None recommended by OT     Recommendations for Other Services         Precautions/Restrictions   Precautions Precautions: Fall Recall of Precautions/Restrictions: Intact Restrictions Weight Bearing Restrictions Per Provider Order: No     Mobility Bed Mobility Overal bed mobility: Needs Assistance Bed Mobility:  Rolling, Sidelying to Sit, Sit to Supine Rolling: Min assist Sidelying to sit: Mod assist   Sit to supine: Max assist, +2 for physical assistance   General bed mobility comments: Pt with pain during all bed mobility    Transfers Overall transfer level: Needs assistance Equipment used: 2 person hand held assist Transfers: Sit to/from Stand Sit to Stand: Max assist, +2 physical assistance           General transfer comment: unable to perform stand pivot transfer.      Balance Overall balance assessment: Needs assistance Sitting-balance support: Single extremity supported, Feet supported Sitting balance-Leahy Scale: Fair     Standing balance support: During functional activity, Bilateral upper extremity supported Standing balance-Leahy Scale: Poor Standing balance comment: Pt must have outside assist to remain standing and only could for less than 10 seconds                           ADL either performed or assessed with clinical judgement   ADL Overall ADL's : Needs assistance/impaired Eating/Feeding: Minimal assistance;Sitting   Grooming: Wash/dry face;Oral care;Wash/dry hands;Minimal assistance;Sitting   Upper Body Bathing: Moderate assistance;Sitting   Lower Body Bathing: Maximal assistance;Sit to/from stand;Sitting/lateral leans   Upper Body Dressing : Moderate assistance;Sitting   Lower Body Dressing: Maximal assistance;Sit to/from stand;Cueing for compensatory techniques   Toilet Transfer: Maximal assistance;Squat-pivot;BSC/3in1 Toilet Transfer Details (indicate cue type and reason): pt rarely uses toilet.  uses depends most of time and wife cleans him up Toileting- Architect and Hygiene: Total assistance  Functional mobility during ADLs: Maximal assistance General ADL Comments: Pt requires a great amount of assist from wife with all adls.     Vision Baseline Vision/History: 0 No visual deficits Ability to See in Adequate  Light: 0 Adequate Patient Visual Report: No change from baseline Vision Assessment?: No apparent visual deficits     Perception Perception: Impaired Preception Impairment Details: Inattention/Neglect Perception-Other Comments: mild r inattention   Praxis Praxis: WFL       Pertinent Vitals/Pain Pain Assessment Pain Assessment: No/denies pain     Extremity/Trunk Assessment Upper Extremity Assessment Upper Extremity Assessment: Right hand dominant;RUE deficits/detail RUE Deficits / Details: Pt with significant deficits from CVA.  PROM limited due to pain. AROM: very little. pt does not use RUE functionally RUE: Unable to fully assess due to pain RUE Sensation: decreased light touch RUE Coordination: decreased fine motor;decreased gross motor   Lower Extremity Assessment Lower Extremity Assessment: Defer to PT evaluation   Cervical / Trunk Assessment Cervical / Trunk Assessment: Kyphotic   Communication Communication Communication: Impaired Factors Affecting Communication: Reduced clarity of speech   Cognition Arousal: Alert Behavior During Therapy: WFL for tasks assessed/performed Cognition: History of cognitive impairments             OT - Cognition Comments: high level problem solving                 Following commands: Intact       Cueing  General Comments   Cueing Techniques: Verbal cues  Pt most limited by pain and previous CVA symptoms, new wound vac   Exercises     Shoulder Instructions      Home Living Family/patient expects to be discharged to:: Private residence Living Arrangements: Spouse/significant other Available Help at Discharge: Family;Available 24 hours/day (aide 2 x week for a few hours) Type of Home: Apartment Home Access: Level entry     Home Layout: One level     Bathroom Shower/Tub: Producer, television/film/video: Handicapped height Bathroom Accessibility: No   Home Equipment: IT sales professional;Other  (comment);Hospital bed;Wheelchair - power;BSC/3in1   Additional Comments: 5L O2 baseline, R hemi since stroke in November, went to [facility] after stroke      Prior Functioning/Environment Prior Level of Function : Needs assist       Physical Assist : Mobility (physical);ADLs (physical) Mobility (physical): Bed mobility;Transfers;Gait ADLs (physical): Grooming;Bathing;Dressing;Toileting;IADLs Mobility Comments: pt's spouse assist him with bed mobility and transfers with sliding board to transport chair. Pt is able to maintain static sitting balance without support ADLs Comments: assist for ADLs, setup for feeding; wears briefs for toileting, bathes and gets dressed seated EOB; plans to use community transportation    OT Problem List: Decreased strength;Decreased range of motion;Decreased activity tolerance;Impaired balance (sitting and/or standing);Decreased coordination;Cardiopulmonary status limiting activity;Impaired UE functional use   OT Treatment/Interventions: Self-care/ADL training;Therapeutic activities;DME and/or AE instruction      OT Goals(Current goals can be found in the care plan section)   Acute Rehab OT Goals Patient Stated Goal: to get back to my normal OT Goal Formulation: With patient Time For Goal Achievement: 08/01/24 Potential to Achieve Goals: Good ADL Goals Pt Will Perform Eating: with set-up;sitting Pt Will Perform Upper Body Bathing: with min assist;sitting Pt Will Perform Upper Body Dressing: with min assist;sitting Additional ADL Goal #1: Pt will scoot transfer to chair by bed to simulate similar transfer to power chair at home with mod assist. Additional ADL Goal #2: Pt will have awareness of and direct  care around wound vac to be prepared to assist with this when at home with wife without cues.   OT Frequency:  Min 2X/week    Co-evaluation              AM-PAC OT 6 Clicks Daily Activity     Outcome Measure Help from another person  eating meals?: A Little Help from another person taking care of personal grooming?: A Little Help from another person toileting, which includes using toliet, bedpan, or urinal?: A Lot Help from another person bathing (including washing, rinsing, drying)?: A Lot Help from another person to put on and taking off regular upper body clothing?: A Lot Help from another person to put on and taking off regular lower body clothing?: A Lot 6 Click Score: 14   End of Session Equipment Utilized During Treatment: Oxygen  Nurse Communication: Mobility status  Activity Tolerance: Patient limited by fatigue Patient left: in bed;with call bell/phone within reach;with bed alarm set  OT Visit Diagnosis: Unsteadiness on feet (R26.81);Muscle weakness (generalized) (M62.81)                Time: 9099-9063 OT Time Calculation (min): 36 min Charges:  OT General Charges $OT Visit: 1 Visit OT Evaluation $OT Eval Moderate Complexity: 1 Mod OT Treatments $Self Care/Home Management : 8-22 mins  Joshua Silvano Dragon 07/18/2024, 9:59 AM

## 2024-07-18 NOTE — Progress Notes (Addendum)
 Progress Note    07/18/2024 6:39 AM 1 Day Post-Op  Subjective:  says he feels good just a little sore  Tm 99.5  HR 70's-80's  100's-140's systolic 94% [place]  Vitals:   07/17/24 2300 07/18/24 0400  BP: (!) 141/59 (!) 120/55  Pulse: 70 73  Resp: 12 11  Temp: 98.7 F (37.1 C) 99.5 F (37.5 C)  SpO2: 94% 94%    Physical Exam: General:  no distress  Cardiac:  regular Lungs:  non labored Incisions:  right groin with wound vac with good seal; JP drain with scant drainage out Extremities:  brisk right peroneal doppler signal; right foot is warm   CBC    Component Value Date/Time   WBC 14.9 (H) 07/18/2024 0520   RBC 3.73 (L) 07/18/2024 0520   HGB 10.4 (L) 07/18/2024 0520   HGB 14.8 02/12/2022 0943   HCT 32.1 (L) 07/18/2024 0520   HCT 45.0 02/12/2022 0943   PLT 182 07/18/2024 0520   PLT 174 02/12/2022 0943   MCV 86.1 07/18/2024 0520   MCV 95 02/12/2022 0943   MCH 27.9 07/18/2024 0520   MCHC 32.4 07/18/2024 0520   RDW 24.8 (H) 07/18/2024 0520   RDW 15.0 02/12/2022 0943   LYMPHSABS 1.4 07/10/2024 1650   LYMPHSABS 1.5 02/12/2022 0943   MONOABS 0.8 07/10/2024 1650   EOSABS 0.2 07/10/2024 1650   EOSABS 0.1 02/12/2022 0943   BASOSABS 0.0 07/10/2024 1650   BASOSABS 0.0 02/12/2022 0943    BMET    Component Value Date/Time   NA 144 07/18/2024 0520   NA 145 (H) 02/03/2023 0927   K 4.4 07/18/2024 0520   CL 110 07/18/2024 0520   CO2 23 07/18/2024 0520   GLUCOSE 121 (H) 07/18/2024 0520   BUN 24 (H) 07/18/2024 0520   BUN 22 02/03/2023 0927   CREATININE 1.65 (H) 07/18/2024 0520   CREATININE 0.99 11/12/2014 1344   CALCIUM  8.6 (L) 07/18/2024 0520   GFRNONAA 44 (L) 07/18/2024 0520   GFRAA 46 (L) 02/23/2020 0421    INR    Component Value Date/Time   INR 1.5 (H) 07/10/2024 1650     Intake/Output Summary (Last 24 hours) at 07/18/2024 9360 Last data filed at 07/18/2024 0549 Gross per 24 hour  Intake 1830 ml  Output 1500 ml  Net 330 ml   Wound cx intra-op:     Component Ref Range & Units (hover) 1 d ago  Specimen Description TISSUE  Special Requests RIGHT GROIN PSEUDOANEURYSM FOR CULTURE  Gram Stain FEW WBC PRESENT, PREDOMINANTLY PMN NO ORGANISMS SEEN Performed at [facility], [address], [place], [name] [address]  Culture PENDING  Report Status PENDING       Assessment/Plan:  73 y.o. male is s/p:   Repair of right common femoral pseudoaneurysm, right common femoral artery to profunda bypass with 8 mm dacron graft, revision of right common femoral to peroneal vein graft with new proximal anastomosis to dacron interposition, right lower extremity thrombectomy, sartorius muscle flap right groin for coverage of dacron graft, 15 French drain placement right sartorius muscle bed, and wound VAC right groin wound 07/17/2024 by Dr. Gretta 1 Day Post-Op   -pt with brisk right peroneal doppler signa.  -acute surgical blood loss anemia-he has received 2 units PRBC's and hgb this am 10.4. -hx CKD-creatinine 1.65 up from 1.4 pre op (had creatinine of 0.8 yesterday, however, this most likely was error as creatinine before and after has been 1.3-1.4. -wound cx gram stain  with few WBC-would continue broad spectrum abx -wound vac with 150cc/24hr (50 cc last shift)-no JP output recorded as there was only scant drainage emptied (< 5cc)  -DVT prophylaxis:  sq heparin  to start this am   Lucie Apt, PA-C [facility] [phone] 07/18/2024 6:39 AM  I have seen and evaluated the patient. I agree with the PA note as documented above.  Postop day 1 status post right femoral pseudoaneurysm repair with interposition graft to the profunda and revision of the femoral to peroneal bypass.  He has brisk Doppler signal in the right foot.  Groin looks great.  No growth on cultures so far.  Would continue broad-spectrum antibiotics until we get final culture data.  Continue VAC and drain today.  Will need ultimate VAC for discharge  home.  Lonni DOROTHA Gaskins, MD [facility] [place] Office: [phone]

## 2024-07-18 NOTE — Assessment & Plan Note (Addendum)
 Extensive history of surgical repair (bypass grafts); most recently above. - Crestor  10 mg daily; lipids well controlled on this; can consider increasing outpatient with PCP

## 2024-07-18 NOTE — Assessment & Plan Note (Addendum)
 POD 1 s/p right femoral artery pseudoaneurysm repair. Wound VAC present in right groin. Pain endorsing moderate right groin pain post-surgery. - Vascular surgery following - Antibiotics: IV Vancomycin  (9/22- ), IV Zosyn  (9/22- ) - Per vascular, planning to continue broad-spectrum antibiotics until culture results - Pain: Dilaudid  0.5 mg IV Q3H prn for severe pain, Oxycodone  5 mg q6h for moderate pain, and Tylenol  650 mg Q4H for mild pain - S/p 2 units PRBCs and stable Hgb of 10.4 today - Bowel regimen: Miralax  and Senna daily prn for constipation - Incentive spirometry - Wound culture: pending - VAC in place - PT/OT eval and treat - Labs: AM BMP, CBC

## 2024-07-18 NOTE — Plan of Care (Addendum)
 HPI: Santina to check in on patient following earlier desat event.  He was resting comfortably in bed, denies any trouble with breathing.  Was on 6 L O2 via [place] when I arrived and satting high 90s to 100.  He does report that he has not yet voided today.  No other concerns.  Physical exam: Cardiovascular: Regular rate and rhythm, no murmurs/rubs/gallops. Respiratory: Normal work of breathing on 6 L O2 via nasal cannula. Clear to auscultation bilaterally; no wheezes, crackles.  A&P: Acute hypoxemia, resolved O2 decreased from 6 L to 5 L in the room with patient tolerating this well and maintaining O2 sats in high 90s to 100.  Left at 5 L O2 via [place]. -Continue to wean back to baseline of 4 L O2 via [place] as tolerated -Encouraged continued use of incentive spirometer  Concern for urinary retention -Bladder scan ordered -Further management pending scan  Alan Flies, MD 07/18/24 5:04 PM  Addendum 5:19 PM: Heard back from nurse Brice Haley: We scanned him twice at 1245; showed 90 ml, and scanned 15 mins ago 23 ml only, he is incontinent in the bed. -Cancelling bladder scan

## 2024-07-18 NOTE — Evaluation (Addendum)
 Physical Therapy Evaluation Patient Details Name: Kyle [name] MRN: [id number] DOB: 01/15/1951 Today's Date: 07/18/2024  History of Present Illness  pt is a 73 yo male admitted for repair of R com fem pseudoaneurysm wit hR CFA to profunda bypass and revsion of R DVA to peroneal vein graft, R LLE thrombectomy and sartorius ms. flap with placement of R wound vac.  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  Clinical Impression  Pt admitted with above diagnosis. Pt requiring +2 max assist for mobility. States that he his wife helped with transfers PTA and he used sliding board. Will bring sliding board to try next session as pt needing max assist of 2 scooting today.  He states wife assists 24 hours. Need to clarify with wife that she can assist pt at home.  Pt currently with functional limitations due to the deficits listed below (see PT Problem List). Pt will benefit from acute skilled PT to increase their independence and safety with mobility to allow discharge.           If plan is discharge home, recommend the following: A lot of help with walking and/or transfers;A lot of help with bathing/dressing/bathroom;Assistance with cooking/housework;Assist for transportation;Help with stairs or ramp for entrance   Can travel by private vehicle        Equipment Recommendations None recommended by PT  Recommendations for Other Services       Functional Status Assessment Patient has had a recent decline in their functional status and demonstrates the ability to make significant improvements in function in a reasonable and predictable amount of time.     Precautions / Restrictions Precautions Precautions: Fall;Other (comment) Recall of Precautions/Restrictions: Intact Precaution/Restrictions Comments: watch O2 sats. pt dropped to 80s with activity on 4L of O2, wound VAC, JP drain right LE Restrictions Weight Bearing Restrictions Per Provider Order: No       Mobility  Bed Mobility Overal bed mobility: Needs Assistance Bed Mobility: Rolling, Sidelying to Sit, Sit to Supine Rolling: Min assist Sidelying to sit: Mod assist   Sit to supine: Max assist, +2 for physical assistance   General bed mobility comments: Pt with pain during all bed mobility    Transfers Overall transfer level: Needs assistance   Transfers: Bed to chair/wheelchair/BSC, Sit to/from Stand Sit to Stand: Max assist, +2 physical assistance          Lateral/Scoot Transfers: Max assist, +2 physical assistance, From elevated surface General transfer comment: unable to perform stand pivot transfer.  Pt had incr difficulty scooting to drop arm recliner as well needing max assist and +2 assist. Pt had difficulty keeping bil feet planted with right LE extending and also not weight bearing fully on left LE even with cues and assist keeping left LE planted.  Pt does have a sliding board at home.    Ambulation/Gait                  Stairs            Wheelchair Mobility     Tilt Bed    Modified Rankin (Stroke Patients Only)       Balance Overall balance assessment: Needs assistance Sitting-balance support: Single extremity supported, Feet supported Sitting balance-Leahy Scale: Fair     Standing balance support: During functional activity, Bilateral upper extremity supported Standing balance-Leahy Scale: Poor Standing balance comment: unable to achieve without Max assist of 2 and cannot stand fully  Pertinent Vitals/Pain Pain Assessment Pain Assessment: Faces Faces Pain Scale: Hurts even more Breathing: normal Negative Vocalization: none Facial Expression: smiling or inexpressive Body Language: relaxed Consolability: no need to console PAINAD Score: 0 Pain Location: right LE Pain Descriptors / Indicators: Aching, Discomfort, Grimacing, Guarding Pain Intervention(s): Limited activity within  patient's tolerance, Monitored during session, Repositioned, Premedicated before session    Home Living Family/patient expects to be discharged to:: Private residence Living Arrangements: Spouse/significant other Available Help at Discharge: Family;Available 24 hours/day (aide 2 x week for a few hours) Type of Home: Apartment Home Access: Level entry       Home Layout: One level Home Equipment: Transport chair;Other (comment);Hospital bed;Wheelchair - power;BSC/3in1, sliding board Additional Comments: 5L O2 baseline, R hemi since stroke in November, went to [facility] after stroke    Prior Function Prior Level of Function : Needs assist       Physical Assist : Mobility (physical);ADLs (physical) Mobility (physical): Bed mobility;Transfers;Gait ADLs (physical): Grooming;Bathing;Dressing;Toileting;IADLs Mobility Comments: pt's spouse assist him with bed mobility and transfers with sliding board to transport chair. Pt is able to maintain static sitting balance without support ADLs Comments: assist for ADLs, setup for feeding; wears briefs for toileting, bathes and gets dressed seated EOB; plans to use community transportation     Extremity/Trunk Assessment   Upper Extremity Assessment Upper Extremity Assessment: Defer to OT evaluation RUE Deficits / Details: Pt with significant deficits from CVA.  PROM limited due to pain. AROM: very little. pt does not use RUE functionally RUE: Unable to fully assess due to pain RUE Sensation: decreased light touch RUE Coordination: decreased fine motor;decreased gross motor    Lower Extremity Assessment Lower Extremity Assessment: RLE deficits/detail RLE Deficits / Details: grossly 2-/5 RLE: Unable to fully assess due to pain RLE Sensation: decreased light touch;decreased proprioception    Cervical / Trunk Assessment Cervical / Trunk Assessment: Kyphotic  Communication   Communication Communication: Impaired Factors Affecting  Communication: Reduced clarity of speech    Cognition Arousal: Alert Behavior During Therapy: WFL for tasks assessed/performed   PT - Cognitive impairments: No apparent impairments                         Following commands: Intact       Cueing Cueing Techniques: Verbal cues     General Comments General comments (skin integrity, edema, etc.): Pt most limited by pain and previous CVA symptoms, new wound vac; VSS on 5LO2    Exercises     Assessment/Plan    PT Assessment Patient needs continued PT services  PT Problem List Decreased activity tolerance;Decreased balance;Decreased mobility;Decreased range of motion;Decreased strength;Decreased knowledge of use of DME;Decreased safety awareness;Decreased knowledge of precautions;Pain       PT Treatment Interventions DME instruction;Functional mobility training;Therapeutic activities;Balance training;Therapeutic exercise;Patient/family education    PT Goals (Current goals can be found in the Care Plan section)  Acute Rehab PT Goals Patient Stated Goal: to go home PT Goal Formulation: With patient Time For Goal Achievement: 08/01/24 Potential to Achieve Goals: Fair    Frequency Min 2X/week     Co-evaluation               AM-PAC PT 6 Clicks Mobility  Outcome Measure Help needed turning from your back to your side while in a flat bed without using bedrails?: Total Help needed moving from lying on your back to sitting on the side of a flat bed without using bedrails?: Total Help needed  moving to and from a bed to a chair (including a wheelchair)?: Total Help needed standing up from a chair using your arms (e.g., wheelchair or bedside chair)?: Total Help needed to walk in hospital room?: Total Help needed climbing 3-5 steps with a railing? : Total 6 Click Score: 6    End of Session Equipment Utilized During Treatment: Gait belt;Oxygen  Activity Tolerance: Patient limited by fatigue Patient left: in  chair;with call bell/phone within reach;with chair alarm set Nurse Communication: Mobility status;Need for lift equipment PT Visit Diagnosis: Unsteadiness on feet (R26.81);Muscle weakness (generalized) (M62.81);Pain Pain - Right/Left: Right Pain - part of body: Leg    Time: 0951-1010 PT Time Calculation (min) (ACUTE ONLY): 19 min   Charges:   PT Evaluation $PT Eval Moderate Complexity: 1 Mod   PT General Charges $$ ACUTE PT VISIT: 1 Visit         Dymir Neeson M,PT Acute Rehab Services [phone]   Stephane JULIANNA Bevel 07/18/2024, 12:06 PM

## 2024-07-18 NOTE — Assessment & Plan Note (Signed)
 Wound care as appropriate

## 2024-07-18 NOTE — Assessment & Plan Note (Signed)
" >>  ASSESSMENT AND PLAN FOR PAD (PERIPHERAL ARTERY DISEASE) WRITTEN ON 07/18/2024 10:58 AM BY NEMECEK, AMANDA, MD  Extensive history of surgical repair (bypass grafts); most recently above. - Crestor  10 mg daily; lipids well controlled on this; can consider increasing outpatient with PCP "

## 2024-07-18 NOTE — Anesthesia Postprocedure Evaluation (Signed)
 Anesthesia Post Note  Patient: BECKETT MADEN  Procedure(s) Performed: REPAIR OF RIGHT PSEUDOANEURYSM (Right: Groin) SARTORIOUS MUSCLE FLAP CLOSURE OF RIGHT GROIN (Right: Groin) BYPASS GRAFT COMMON FEMORAL TO PROFUNDA BYPASS USING HEMASHEILD GOLD X 30CM (Right: Groin) APPLICATION, WOUND VAC TO RIGHT GROIN (Right: Groin) REVISION OF RIGHT BYPASS ARTERIAL FEMORAL TO PERONEAL (Right: Groin)     Patient location during evaluation: PACU Anesthesia Type: General Level of consciousness: sedated and patient cooperative Pain management: pain level controlled Vital Signs Assessment: post-procedure vital signs reviewed and stable Respiratory status: spontaneous breathing Cardiovascular status: stable Anesthetic complications: no   No notable events documented.  Last Vitals:  Vitals:   07/18/24 2200 07/18/24 2300  BP: (!) 148/62 (!) 134/54  Pulse: 89 94  Resp: 15 16  Temp: 36.9 C   SpO2:  90%    Last Pain:  Vitals:   07/18/24 2327  TempSrc:   PainSc: 2                  Norleen Pope

## 2024-07-18 NOTE — Assessment & Plan Note (Addendum)
 COPD - 4L O2 via [place] at baseline. Home regimen of Trelegy Ellipta  1 puff daily (on Breztri  formulary equivalent) A fib - Rate control with carvedilol  6.25 mg twice daily. Hold home Eliquis  5 mg twice daily in the setting of being on heparin  HTN - Continue home carvedilol  6.25 mg BID; Hold home regimen of amlodipine  5 mg daily, hydralazine  25 mg daily while borderline normo-hypertensive HLD - Home regimen of Crestor  10 mg daily; LDL of 53, well controlled T2DM - Last A1c 4 months ago, 5.8. Home regimen of Insulin  5u daily in the AM. Will see how he tolerates PO and monitor CBGs w/ SSI and add Lantus  prn. HFpEF - Last echo 08/2023 with EF of 55-60%. Home regimen of carvedilol  6.25 mg twice daily Hx seizure - Home regimen of Keppra  500 mg twice daily  Anxiety/Depression - Home regimen of mirtazapine  7.5 mg daily  GERD - Home regimen of Protonix  40 mg daily  Neuropathy - Home gabapentin  300 mg AM + 600 mg PM

## 2024-07-18 NOTE — Progress Notes (Signed)
 Mobility Specialist Progress Note:    07/18/24 1148  Mobility  Activity Mechanically lifted from chair to bed  Level of Assistance Maximum assist, patient does 25-49% (+2)  Activity Response Tolerated well  Mobility Referral Yes  Mobility visit 1 Mobility  Mobility Specialist Start Time (ACUTE ONLY) 1122  Mobility Specialist Stop Time (ACUTE ONLY) 1132  Mobility Specialist Time Calculation (min) (ACUTE ONLY) 10 min   Pt received in chair, RT's and RN's requesting assistance to transfer pt back to bed d/t SOB, SpO2 66% on 5L. MaxA+2 to mechanically lift pt back to bed. Tolerated well. Left in care of RT's and RN. All needs met.   Kyle [name] Mobility Specialist Please contact via Special educational needs teacher or  Rehab office at [phone]

## 2024-07-18 NOTE — Progress Notes (Addendum)
 Patient's Foley was removed in the morning, and was not able to void, scanned the bladder couple times, he was found incontinent in the bed. Patient was incontinent so external urinary catheter placed,   He was saturating well on HFNC 6liters without any discomfort, SOB, switched him to [place] 4 liters, Spo2 than >90%.   1836; Spo2 dropped to 77% on [place] 5 liters while pt was sleeping, placed him on HFNC 8 liters, MD notified.

## 2024-07-18 NOTE — Progress Notes (Addendum)
 Patient was sitting up on the chair, he was on [place] 5 liters, spo2 dropped down to 66%, he denied any symptoms but he was looking dyspneic, RT was at bedside, placed HFNC at 15 liters for a while and then reduced to 12 liters, spo2 is >90% while he is in the bed.  MD notified via secure chat.

## 2024-07-19 ENCOUNTER — Inpatient Hospital Stay (HOSPITAL_COMMUNITY)

## 2024-07-19 DIAGNOSIS — R918 Other nonspecific abnormal finding of lung field: Secondary | ICD-10-CM | POA: Diagnosis not present

## 2024-07-19 DIAGNOSIS — R531 Weakness: Secondary | ICD-10-CM | POA: Diagnosis not present

## 2024-07-19 DIAGNOSIS — Q214 Aortopulmonary septal defect: Secondary | ICD-10-CM | POA: Diagnosis not present

## 2024-07-19 DIAGNOSIS — T402X5A Adverse effect of other opioids, initial encounter: Secondary | ICD-10-CM

## 2024-07-19 DIAGNOSIS — G8194 Hemiplegia, unspecified affecting left nondominant side: Secondary | ICD-10-CM

## 2024-07-19 DIAGNOSIS — I6503 Occlusion and stenosis of bilateral vertebral arteries: Secondary | ICD-10-CM | POA: Diagnosis not present

## 2024-07-19 DIAGNOSIS — I63233 Cerebral infarction due to unspecified occlusion or stenosis of bilateral carotid arteries: Secondary | ICD-10-CM | POA: Diagnosis not present

## 2024-07-19 DIAGNOSIS — I2510 Atherosclerotic heart disease of native coronary artery without angina pectoris: Secondary | ICD-10-CM | POA: Diagnosis not present

## 2024-07-19 DIAGNOSIS — I6523 Occlusion and stenosis of bilateral carotid arteries: Secondary | ICD-10-CM | POA: Diagnosis not present

## 2024-07-19 DIAGNOSIS — R569 Unspecified convulsions: Secondary | ICD-10-CM

## 2024-07-19 DIAGNOSIS — I724 Aneurysm of artery of lower extremity: Secondary | ICD-10-CM | POA: Diagnosis not present

## 2024-07-19 DIAGNOSIS — R590 Localized enlarged lymph nodes: Secondary | ICD-10-CM | POA: Diagnosis not present

## 2024-07-19 DIAGNOSIS — R29818 Other symptoms and signs involving the nervous system: Secondary | ICD-10-CM | POA: Diagnosis not present

## 2024-07-19 LAB — CBC
HCT: 26.7 % — ABNORMAL LOW (ref 39.0–52.0)
Hemoglobin: 8.7 g/dL — ABNORMAL LOW (ref 13.0–17.0)
MCH: 28.2 pg (ref 26.0–34.0)
MCHC: 32.6 g/dL (ref 30.0–36.0)
MCV: 86.7 fL (ref 80.0–100.0)
Platelets: 150 K/uL (ref 150–400)
RBC: 3.08 MIL/uL — ABNORMAL LOW (ref 4.22–5.81)
RDW: 23.9 % — ABNORMAL HIGH (ref 11.5–15.5)
WBC: 13 K/uL — ABNORMAL HIGH (ref 4.0–10.5)
nRBC: 0 % (ref 0.0–0.2)

## 2024-07-19 LAB — BASIC METABOLIC PANEL WITH GFR
Anion gap: 9 (ref 5–15)
BUN: 25 mg/dL — ABNORMAL HIGH (ref 8–23)
CO2: 23 mmol/L (ref 22–32)
Calcium: 8.5 mg/dL — ABNORMAL LOW (ref 8.9–10.3)
Chloride: 111 mmol/L (ref 98–111)
Creatinine, Ser: 1.71 mg/dL — ABNORMAL HIGH (ref 0.61–1.24)
GFR, Estimated: 42 mL/min — ABNORMAL LOW (ref >60)
Glucose, Bld: 122 mg/dL — ABNORMAL HIGH (ref 70–99)
Potassium: 4 mmol/L (ref 3.5–5.1)
Sodium: 143 mmol/L (ref 135–145)

## 2024-07-19 LAB — GLUCOSE, CAPILLARY
Glucose-Capillary: 103 mg/dL — ABNORMAL HIGH (ref 70–99)
Glucose-Capillary: 191 mg/dL — ABNORMAL HIGH (ref 70–99)
Glucose-Capillary: 127 mg/dL — ABNORMAL HIGH (ref 70–99)
Glucose-Capillary: 149 mg/dL — ABNORMAL HIGH (ref 70–99)
Glucose-Capillary: 173 mg/dL — ABNORMAL HIGH (ref 70–99)

## 2024-07-19 MED ORDER — IOHEXOL 350 MG/ML SOLN
65.0000 mL | Freq: Once | INTRAVENOUS | Status: AC | PRN
Start: 2024-07-19 — End: 2024-07-19
  Administered 2024-07-19: 65 mL via INTRAVENOUS

## 2024-07-19 MED ORDER — ACETAMINOPHEN 650 MG RE SUPP: 975.0000 mg | Freq: Once | RECTAL | Status: DC

## 2024-07-19 MED ORDER — IOHEXOL 350 MG/ML SOLN
100.0000 mL | Freq: Once | INTRAVENOUS | Status: AC | PRN
  Administered 2024-07-19: 100 mL via INTRAVENOUS

## 2024-07-19 MED ORDER — AMLODIPINE BESYLATE 5 MG PO TABS
5.0000 mg | ORAL_TABLET | Freq: Every day | ORAL | Status: DC
  Administered 2024-07-19 – 2024-07-24 (×6): 5 mg via ORAL
  Filled 2024-07-19 (×6): qty 1

## 2024-07-19 MED ORDER — LINEZOLID 600 MG/300ML IV SOLN
600.0000 mg | Freq: Two times a day (BID) | INTRAVENOUS | Status: DC
  Administered 2024-07-19 – 2024-07-23 (×8): 600 mg via INTRAVENOUS
  Filled 2024-07-19 (×9): qty 300

## 2024-07-19 MED ORDER — VANCOMYCIN HCL 1500 MG/300ML IV SOLN: 1500.0000 mg | INTRAVENOUS | Status: DC

## 2024-07-19 MED ORDER — ACETAMINOPHEN 650 MG RE SUPP
975.0000 mg | Freq: Once | RECTAL | Status: DC
Start: 2024-07-19 — End: 2024-07-21

## 2024-07-19 NOTE — Progress Notes (Signed)
 Patient not available at the moment due to other test being performed.  Tech will check back as schedule allows.

## 2024-07-19 NOTE — Plan of Care (Signed)
 FMTS Interim Progress Note  S: Messaged by nursing at 1540 w/ concern of of 19 F fever (oral), tachycardic, altered, and unable to eat on his own as normal. Went urgently to bedside and saw new weakness on the L side that was previously not present on admission or this morning when patient was examined by Dr. Mannie.   Initially patient was unable to relay his name, location, or answer questions initially. He was unable to follow all commands to perform complete neuro exam. Seemed like Mr. Philley's mentation waxed and waned during my time in the room and eventually he was able to tell me his name, but unable to share DOB, location, or month.  O: BP (!) 128/52 (BP Location: Left Arm)   Pulse (!) 107   Temp 98.8 F (37.1 C) (Oral)   Resp 13   Ht 5' 11 (1.803 m)   Wt 68.9 kg   SpO2 97%   BMI 21.19 kg/m   General: Awake, Diaphoretic in NAD HEENT: NCAT. Sclera anicteric. No rhinorrhea. PEERL, but unable to perform EOM Cardiovascular: Tachycardic, regular rhythm Respiratory: Congested. Increased WOB on 12L HFNC Abdomen: Soft, non-tender, non-distended.  Extremities: Wound vac in place, dressing C/D/I Skin: Warm and dry.. Neuro: A&Ox1 (name). 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 New L sided weakness in UE and LE   A/P: Patient was given rectal Tylenol  for fever. However, with new onset L sided weakness on my exam along with mental status changes, and with the patient not being fully anticoagulated there was concern for stroke especially the patient's history. Therefore, Code Stroke was called. Patient was evaluated by my attending Dr. Keneth, code stroke RNs, and Dr. Michaela; exams seemed variable for each provider. With continued concern for stroke-like symptoms, patient was transported to CT for CT Head w/o contrast and CTA Head and Neck. Will follow up with imaging studies.   Appreciate everyone's assistance with this patient's care.  Kathrine Melena, DO 07/19/24 6:05 PM PGY-2,  [facility] Service pager [phone]

## 2024-07-19 NOTE — Progress Notes (Signed)
 Pt is transferring out of the floor to MRI. Vital signs stable.   Wendi Dash, RN

## 2024-07-19 NOTE — Progress Notes (Signed)
 Physical Therapy Treatment Patient Details Name: Kyle [name] MRN: [id number] DOB: [birth year] Today's Date: 07/19/2024   History of Present Illness pt is a 73 yo male admitted for repair of R com fem pseudoaneurysm wit hR CFA to profunda bypass and revsion of R DVA to peroneal vein graft, R LLE thrombectomy and sartorius ms. flap with placement of R wound vac.  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    PT Comments  Pt received in supine, hypoxic with SpO2 73% on 8L/min HF [place], RN notified, wall O2 increased to 12L, [place] repositioned better into nares. Pt SpO2 slowly improved to >90% on 12L, then weaned back to 10L after repositioning more upright, cues for pursed-lip breathing and use of IS. Pt showed how to use mouth suction once able to bring up more secretions. Pt allows HOB up to 54 deg then c/o pain too severe and unable to tolerate more upright posture. Pt needing max to totalA for transfer from elevated HOB to long sitting in bed with L rail and guarding/moaning with R and L HB AA/AROM repositioning during supine exercises. SpO2 93% on 10L HF [place] at end of session, HR 100-115 bpm, pain score severe per PAIN-AD and Faces scales. Pt encouraged to use IS and notify staff when pain increases above mild level. Patient will benefit from continued inpatient follow up therapy, <3 hours/day, discussed rec change with pt and supervising PT Dawn M, no family present in room; DME recs also updated in case family instead decides to take pt home at current level of function, currently pt would need hoyer for pt/family safety as he needs +2 max to totalA for bed mobility and OOB transfers.    If plan is discharge home, recommend the following: A lot of help with bathing/dressing/bathroom;Assistance with cooking/housework;Assist for transportation;Help with stairs or ramp for entrance;Two people to help with walking and/or transfers   Can travel by private vehicle      No  Equipment Recommendations  [place] lift;Hospital bed;Other (comment) (pending progress)    Recommendations for Other Services       Precautions / Restrictions Precautions Precautions: Fall;Other (comment) Recall of Precautions/Restrictions: Intact Precaution/Restrictions Comments: watch O2 sats- [place] comes out of nares easily, wound VAC, JP drain right LE Restrictions Weight Bearing Restrictions Per Provider Order: No     Mobility  Bed Mobility Overal bed mobility: Needs Assistance Bed Mobility: Rolling           General bed mobility comments: supine to bed chair posture wtih maxA and L bed rail for partial transfer to long sitting to adjust pillow behind him, pt with tendency to R lean. Hand over hand assist initially to get him to reach for L bed rail to assist with repositioning.    Transfers Overall transfer level: Needs assistance                 General transfer comment: Defer, pt in too much pain/guarding on R HB and desat with repositioning upright in bed; RN arriving to assess his breathing and give pain meds.    Ambulation/Gait                   Stairs             Wheelchair Mobility     Tilt Bed    Modified Rankin (Stroke Patients Only)       Balance Overall balance assessment: Needs assistance Sitting-balance support:  Single extremity supported, Feet supported Sitting balance-Leahy Scale: Poor Sitting balance - Comments: unable to transfer to EOB, but poor tolerance for upright long sitting in bed today due to c/o pain       Standing balance comment: defer; resp status poor and not able to maintain SpO2 WFL on 10-12L O2 [place] with long sitting.                            Communication Communication Communication: Impaired Factors Affecting Communication: Reduced clarity of speech;Difficulty expressing self  Cognition Arousal: Alert Behavior During Therapy: Anxious, Flat affect   PT - Cognitive impairments:  No family/caregiver present to determine baseline, Initiation, Sequencing, Problem solving, Safety/Judgement                       PT - Cognition Comments: Pt with poor initiation this session on L and R hemi-body, guarding due to c/o RUE/RLE pain and quick to desat with minimal AAROM/repositioning into bed chair posture. Following commands: Impaired Following commands impaired: Follows one step commands inconsistently, Follows one step commands with increased time, Follows multi-step commands inconsistently    Cueing Cueing Techniques: Verbal cues, Gestural cues, Tactile cues  Exercises Other Exercises Other Exercises: RLE PROM: knee extension stretch x30 sec x2 reps Other Exercises: LLE AROM: hip flexion, LAQ (some AA for teachback) x10 reps ea Other Exercises: IS x 5 reps x2 sets to work on improved pulmonary clearance/deep breaths, pt achieves 500-900 mL    General Comments General comments (skin integrity, edema, etc.): Received with SpO2 73% on 8L HF [place] with [place] slid partially to side of nares/only in one side of his nose, [place] repositioned properly and needed increase to 12L HF [place] to improve to >92% after a few mins. RN notified pt may benefit from tape to cheekbones/[place] tubing to keep it better applied to his nose; Also could use RUE sling as pt tending to hold limb with R shoulder IR/wrist and elbow flexion and pushing down to groin area as well with RUE/LE, RN reports he uses a sling and resting splint for night time (states yes when prompted with question by PTA) so will need to confirm with spouse which braces/slings he owns at home. Hypoxic/tachy with minimal postural change in bed today so defer EOB/OOB due to pain/shallow breathing and tachycardia/elevated BP. RUE positioned with rolled blanket under elbow to support shoulder as pt tending to lean/slouch to R in bed chair posture and c/o shoulder pain when unsupported      Pertinent Vitals/Pain Pain Assessment Pain  Assessment: Faces Faces Pain Scale: Hurts even more Breathing: noisy labored breathing, long periods of hyperventilation, Cheyne-Stokes respirations Negative Vocalization: occasional moan/groan, low speech, negative/disapproving quality Facial Expression: facial grimacing Body Language: rigid, fists clenched, knees up, pushing/pulling away, strikes out Consolability: distracted or reassured by voice/touch PAINAD Score: 8 Pain Location: right LE and RUE shoulder/arm/wrist Pain Descriptors / Indicators: Aching, Discomfort, Grimacing, Guarding Pain Intervention(s): Limited activity within patient's tolerance, Monitored during session, Repositioned, Patient requesting pain meds-RN notified    Home Living                          Prior Function            PT Goals (current goals can now be found in the care plan section) Acute Rehab PT Goals Patient Stated Goal: to go home PT Goal Formulation: With  patient Time For Goal Achievement: 08/01/24 Progress towards PT goals: Progressing toward goals (slowly)    Frequency    Min 2X/week      PT Plan      Co-evaluation              AM-PAC PT 6 Clicks Mobility   Outcome Measure  Help needed turning from your back to your side while in a flat bed without using bedrails?: Total Help needed moving from lying on your back to sitting on the side of a flat bed without using bedrails?: Total Help needed moving to and from a bed to a chair (including a wheelchair)?: Total Help needed standing up from a chair using your arms (e.g., wheelchair or bedside chair)?: Total Help needed to walk in hospital room?: Total Help needed climbing 3-5 steps with a railing? : Total 6 Click Score: 6    End of Session Equipment Utilized During Treatment: Oxygen  Activity Tolerance: Patient limited by fatigue;Treatment limited secondary to medical complications (Comment);Other (comment) (hypoxia and tachycardia with minimal repositioning in  bed chair posture) Patient left: with call bell/phone within reach;in bed;with bed alarm set;Other (comment);with nursing/sitter in room (HOB elevated to ~54 deg, pillows repositioned, RN/NT present to assist with bathing and pain medication administration) Nurse Communication: Mobility status;Need for lift equipment;Patient requests pain meds;Other (comment) (SpO2/HR) PT Visit Diagnosis: Unsteadiness on feet (R26.81);Muscle weakness (generalized) (M62.81);Pain Pain - Right/Left: Right Pain - part of body: Leg     Time: 1200-1215 PT Time Calculation (min) (ACUTE ONLY): 15 min  Charges:    $Therapeutic Exercise: 8-22 mins PT General Charges $$ ACUTE PT VISIT: 1 Visit                     Mirah Nevins P., PTA Acute Rehabilitation Services Secure Chat Preferred 9a-5:30pm Office: [phone]    Connell CHRISTELLA Blue 07/19/2024, 1:51 PM

## 2024-07-19 NOTE — Progress Notes (Signed)
 Pt transferred from MRI back to his room. Vital signs remain stable.    Wendi Dash, RN

## 2024-07-19 NOTE — Assessment & Plan Note (Addendum)
 COPD - 4L O2 via [place] at baseline. Home regimen of Trelegy Ellipta  1 puff daily (on Breztri  formulary equivalent) A fib - Rate controlled with carvedilol  6.25 mg twice daily. Hold home Eliquis  5 mg twice daily in the setting of being on heparin  HTN - Continue home carvedilol  6.25 mg BID; Hold home regimen of amlodipine  5 mg daily, hydralazine  25 mg daily while borderline normo-hypertensive HLD - Home regimen of Crestor  10 mg daily; LDL of 53, well controlled T2DM - Last A1c 4 months ago, 5.8. Home regimen of Insulin  5u daily in the AM. Will see how he tolerates PO and monitor CBGs w/ SSI and add Lantus  prn. HFpEF - Last echo 08/2023 with EF of 55-60%. Home regimen of carvedilol  6.25 mg twice daily Hx seizure - Home regimen of Keppra  500 mg twice daily  Anxiety/Depression - Home regimen of mirtazapine  7.5 mg daily  GERD - Home regimen of Protonix  40 mg daily  Neuropathy - Home gabapentin  300 mg AM + 600 mg PM
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
## 2024-07-19 NOTE — Consult Note (Signed)
 NEUROLOGY CONSULT NOTE   Date of service: July 19, 2024 Patient Name: Kyle [name] MRN:  [id number] DOB:  1951/05/06 Chief Complaint: Code stroke  Requesting Provider: McDiarmid, Krystal BIRCH, MD  History of Present Illness  Kyle [name] is a 73 y.o. male with hx of A fib on Eliquis   currently on hold for recent surgery, prior hx of ICH stroke with right side hemiplegia, PAD, PVD, COPD, CKD, CHF, dilated cardiomyopathy, pulmonary hypertension, HTN, DM, HLD, aortic atherosclerosis, Hx of seizures on home Keppra , neuropathy  Who was admitted to the hospital on 9/22 following a right pseudoaneurysm repair with vascular surgery.  Code stroke activated for acute onset of left side weakness and slurred speech and confusion. LKW unclear. Per RN and MD at the bedside, this morning he was oriented x 4 and was moving his left side. PT worked with patient and did not notice any left side weakness at the time of their interaction with patient. RN states that @ 1450 patient was noted to be oriented but unsure of left side weakness at that time.  Patient has received 0.5mg  dilaudid  at 1538, developed a fever of 103 and became tachycardiac   On exam he is awake and alert, oriented to self. His exam is waxing and waning. Speech is dysarthric, no aphasia noted. Right hemibody is plegic from prior stroke, and left arm with some movement but can not hold against gravity, left leg no movement, noted asterixis in left arm and left leg,  sensation intact bilaterally but has some left hemibody neglect to painful stimuli on left toes. CT head with no acute process. CTA head and neck with no LVO  After return from CT patient is now alert and oriented and able to hold his left arm and left leg up against gravity.   LKW: unclear  Modified rankin score: 4-Needs assistance to walk and tend to bodily needs IV Thrombolysis:  No unclear LKW  EVT:  No LVO   NIHSS components Score: Comment  1a Level of Conscious 0[x]  1[]   2[]  3[]      1b LOC Questions 0[]  1[]  2[]       1c LOC Commands 0[x]  1[]  2[]       2 Best Gaze 0[x]  1[]  2[]       3 Visual 0[x]  1[]  2[]  3[]      4 Facial Palsy 0[x]  1[]  2[]  3[]      5a Motor Arm - left 0[]  1[x]  2[]  3[]  4[]  UN[]    5b Motor Arm - Right 0[]  1[]  2[]  3[]  4[x]  UN[]    6a Motor Leg - Left 0[]  1[]  2[]  3[x]  4[]  UN[]    6b Motor Leg - Right 0[]  1[]  2[]  3[]  4[x]  UN[]    7 Limb Ataxia 0[]  1[]  2[]  UN[]      8 Sensory 0[]  1[x]  2[]  UN[]      9 Best Language 0[]  1[x]  2[]  3[]      10 Dysarthria 0[]  1[x]  2[]  UN[]      11 Extinct. and Inattention 0[]  1[x]  2[]       TOTAL: 15      ROS  Comprehensive ROS Unable to ascertain due to AMS  Past History   Past Medical History:  Diagnosis Date   Acute hypoxic on chronic hypercapnic respiratory failure (HCC) 01/05/2024   Acute on chronic respiratory failure with hypoxia (HCC) 12/04/2023   Aortic insufficiency    MODERATE WITH A BICUSPID AORTIC VAVLE   Aortic stenosis 09/12/2014   Moderate with AVA 1.3 cm, pk 30 mmHg, echo Oct 2015  Aortoiliac occlusive disease (HCC) 12/16/2014   Arterial occlusive disease    MULTILEVEL   Atherosclerosis of artery of extremity with rest pain (HCC) 04/21/2019   Atrial fibrillation (HCC)    CHF (congestive heart failure) (HCC)    Chronic bronchitis (HCC)    CKD (chronic kidney disease) stage 3, GFR 30-59 ml/min (HCC)    COPD (chronic obstructive pulmonary disease) (HCC)    Dilated cardiomyopathy (HCC)    WITH EJECTION FRACTION DOWN TO 20-25%--WITH CONGESTIVE HEART FAILURE   Edema    LOWER EXTREMETIES   Emphysema of lung (HCC)    Hemorrhagic stroke (HCC) 09/06/2023   right side defecit   Hypertension    ICH (intracerebral hemorrhage) (HCC) 09/10/2023   Insulin  dependent type 2 diabetes mellitus (HCC)    Normal coronary arteries 2009   Orthopnea    Peripheral arterial disease    Pulmonary hypertension (HCC)    Stroke [facility])     Past Surgical History:  Procedure Laterality Date   ABDOMINAL AORTAGRAM N/A  11/05/2014   Procedure: ABDOMINAL EZELLA;  Surgeon: Lonni GORMAN Blade, MD;  Location: [facility] CATH LAB;  Service: Cardiovascular;  Laterality: N/A;   ABDOMINAL AORTOGRAM W/LOWER EXTREMITY Bilateral 04/21/2019   Procedure: ABDOMINAL AORTOGRAM W/LOWER EXTREMITY;  Surgeon: Blade Lonni GORMAN, MD;  Location: [facility] INVASIVE CV LAB;  Service: Cardiovascular;  Laterality: Bilateral;   ABDOMINAL AORTOGRAM W/LOWER EXTREMITY Right 05/30/2021   Procedure: ABDOMINAL AORTOGRAM W/LOWER EXTREMITY;  Surgeon: Blade Lonni GORMAN, MD;  Location: [facility] INVASIVE CV LAB;  Service: Cardiovascular;  Laterality: Right;   AORTA - BILATERAL FEMORAL ARTERY BYPASS GRAFT N/A 12/18/2014   Procedure: AORTOBIFEMORAL BYPASS GRAFT;  Surgeon: Lonni GORMAN Blade, MD;  Location: [facility] OR;  Service: Vascular;  Laterality: N/A;   APPLICATION OF WOUND VAC Right 07/17/2024   Procedure: APPLICATION, WOUND VAC TO RIGHT GROIN;  Surgeon: Gretta Lonni PARAS, MD;  Location: [facility] OR;  Service: Vascular;  Laterality: Right;   BYPASS GRAFT FEMORAL-PERONEAL Right 07/17/2024   Procedure: REVISION OF RIGHT BYPASS ARTERIAL FEMORAL TO PERONEAL;  Surgeon: Gretta Lonni PARAS, MD;  Location: [facility] OR;  Service: Vascular;  Laterality: Right;   CARDIAC CATHETERIZATION  2009   Nl Cors, EF 20%   COLONOSCOPY     ENDARTERECTOMY FEMORAL Left 12/18/2014   Procedure: ENDARTERECTOMY FEMORAL;  Surgeon: Lonni GORMAN Blade, MD;  Location: [facility] OR;  Service: Vascular;  Laterality: Left;   FALSE ANEURYSM REPAIR Right 04/24/2019   Procedure: REPAIR OF RIGHT FEMORAL ARTERY PSEUDOANEURYSM;  Surgeon: Oris Krystal FALCON, MD;  Location: [facility] OR;  Service: Vascular;  Laterality: Right;   FALSE ANEURYSM REPAIR Right 07/17/2024   Procedure: REPAIR OF RIGHT PSEUDOANEURYSM;  Surgeon: Gretta Lonni PARAS, MD;  Location: [facility] OR;  Service: Vascular;  Laterality: Right;  R GROIN RECONSTRUCTION WITH MUSCLE FLAP   FEMORAL-POPLITEAL BYPASS GRAFT  02/21/2011   right   FEMORAL-POPLITEAL BYPASS GRAFT  Right 07/17/2024   Procedure: BYPASS GRAFT COMMON FEMORAL TO PROFUNDA BYPASS USING HEMASHEILD GOLD X 30CM;  Surgeon: Gretta Lonni PARAS, MD;  Location: [facility] OR;  Service: Vascular;  Laterality: Right;   FEMORAL-TIBIAL BYPASS GRAFT Right 04/24/2019   Procedure: REDO BYPASS GRAFT RIGHT FEMORAL-TIBIAL ARTERY USING LEFT LEG VEIN & HEMASHIELD GOLD 8mm GRAFT;  Surgeon: Oris Krystal FALCON, MD;  Location: [facility] OR;  Service: Vascular;  Laterality: Right;   IR FLUORO GUIDE CV LINE RIGHT  12/28/2019   IR REMOVAL TUN CV CATH W/O FL  01/11/2020   IR US  GUIDE VASC ACCESS RIGHT  12/28/2019  MUSCLE FLAP CLOSURE Right 07/17/2024   Procedure: SARTORIOUS MUSCLE FLAP CLOSURE OF RIGHT GROIN;  Surgeon: Gretta Lonni PARAS, MD;  Location: [facility] OR;  Service: Vascular;  Laterality: Right;  R GROIN RECONSTRUCTION WITH MUSCLE FLAP   OTHER SURGICAL HISTORY     POST RIGHT FEMOROPOPLITEAL BYPASS GRAFT   PERIPHERAL VASCULAR CATHETERIZATION  11/05/2014   Procedure: LOWER EXTREMITY ANGIOGRAPHY;  Surgeon: Lonni GORMAN Blade, MD;  Location: [facility] CATH LAB;  Service: Cardiovascular;;   VEIN HARVEST Left 04/24/2019   Procedure: LEFT LEG SAPHENOUS VEIN HARVEST;  Surgeon: Oris Krystal FALCON, MD;  Location: [facility] OR;  Service: Vascular;  Laterality: Left;    Family History: Family History  Problem Relation Age of Onset   Diabetes Mother    Hyperlipidemia Sister    Hypertension Sister     Social History  reports that he quit smoking about 5 years ago. His smoking use included cigarettes. He started smoking about 45 years ago. He has a 40 pack-year smoking history. He has never been exposed to tobacco smoke. He has never used smokeless tobacco. He reports that he does not drink alcohol and does not use drugs.  No Known Allergies  Medications   Current Facility-Administered Medications:    0.9 %  sodium chloride  infusion, 10 mL/hr, Intravenous, Once, Rhyne, Samantha J, PA-C   0.9 %  sodium chloride  infusion, 500 mL, Intravenous, Once PRN, Rhyne,  Samantha J, PA-C   acetaminophen  (TYLENOL ) tablet 650 mg, 650 mg, Oral, Q4H PRN **OR** acetaminophen  (TYLENOL ) suppository 650 mg, 650 mg, Rectal, Q4H PRN, Janna, Adriana, DO, 650 mg at 07/19/24 1547   acetaminophen  (TYLENOL ) suppository 975 mg, 975 mg, Rectal, Once, [name], MD   amLODipine  (NORVASC ) tablet 5 mg, 5 mg, Oral, QHS, Gomes, Adriana, DO   budesonide -glycopyrrolate -formoterol  (BREZTRI ) 160-9-4.8 MCG/ACT inhaler 2 puff, 2 puff, Inhalation, BID, Gomes, Adriana, DO, 2 puff at 07/19/24 9263   carvedilol  (COREG ) tablet 6.25 mg, 6.25 mg, Oral, BID WC, Gomes, Adriana, DO, 6.25 mg at 07/19/24 0806   Chlorhexidine  Gluconate Cloth 2 % PADS 6 each, 6 each, Topical, Daily, McDiarmid, Krystal BIRCH, MD, 6 each at 07/19/24 1000   gabapentin  (NEURONTIN ) capsule 300 mg, 300 mg, Oral, Daily, Janna, Adriana, DO, 300 mg at 07/19/24 0806   gabapentin  (NEURONTIN ) capsule 600 mg, 600 mg, Oral, QHS, Gomes, Adriana, DO, 600 mg at 07/18/24 2203   heparin  injection 5,000 Units, 5,000 Units, Subcutaneous, Q8H, Rhyne, Samantha J, PA-C, 5,000 Units at 07/19/24 1528   HYDROmorphone  (DILAUDID ) injection 0.5 mg, 0.5 mg, Intravenous, Q3H PRN, Rhyne, Samantha J, PA-C, 0.5 mg at 07/19/24 1528   insulin  aspart (novoLOG ) injection 0-9 Units, 0-9 Units, Subcutaneous, TID WC, Gomes, Adriana, DO, 2 Units at 07/19/24 1213   levETIRAcetam  (KEPPRA ) tablet 500 mg, 500 mg, Oral, BID, Nygaard, Joseph, MD, 500 mg at 07/19/24 9193   linezolid  (ZYVOX ) IVPB 600 mg, 600 mg, Intravenous, Q12H, [name], MD   mirtazapine  (REMERON ) tablet 7.5 mg, 7.5 mg, Oral, QHS, Gomes, Adriana, DO, 7.5 mg at 07/18/24 2203   mupirocin  ointment (BACTROBAN ) 2 % 1 Application, 1 Application, Nasal, BID, McDiarmid, Krystal BIRCH, MD, 1 Application at 07/19/24 9193   ondansetron  (ZOFRAN ) injection 4 mg, 4 mg, Intravenous, Q6H PRN, Rhyne, Samantha J, PA-C   oxyCODONE  (Oxy IR/ROXICODONE ) immediate release tablet 5 mg, 5 mg, Oral, Q6H PRN, Gomes, Adriana,  DO, 5 mg at 07/18/24 2249   pantoprazole  (PROTONIX ) EC tablet 40 mg, 40 mg, Oral, Daily, Gomes, Adriana, DO, 40 mg at  07/19/24 0806   phenol (CHLORASEPTIC) mouth spray 1 spray, 1 spray, Mouth/Throat, PRN, Rhyne, Samantha J, PA-C, 1 spray at 09-Aug-2024 1433   piperacillin -tazobactam (ZOSYN ) IVPB 3.375 g, 3.375 g, Intravenous, Q8H, Billy Rocky SAUNDERS, RPH, Last Rate: 12.5 mL/hr at 07/19/24 0807, 3.375 g at 07/19/24 9192   polyethylene glycol (MIRALAX  / GLYCOLAX ) packet 17 g, 17 g, Oral, Daily PRN, Rhyne, Samantha J, PA-C   rosuvastatin  (CRESTOR ) tablet 10 mg, 10 mg, Oral, QHS, Gomes, Adriana, DO, 10 mg at 08-09-2024 2203   senna (SENOKOT) tablet 8.6 mg, 1 tablet, Oral, Daily PRN, Janna Ferrier, DO  Vitals   Vitals:   07/19/24 1417 07/19/24 1430 07/19/24 1526 07/19/24 1606  BP: (!) 178/70 (!) 182/76 (!) 169/75 (!) 160/69  Pulse: (!) 104 (!) 110 (!) 119 (!) 111  Resp: 18 18 19 16   Temp:   (!) 103 F (39.4 C)   TempSrc:   Oral   SpO2: 93% 92% 90%   Weight:      Height:        Body mass index is 21.19 kg/m.   Physical Exam   Constitutional: acutely ill Psych: Affect appropriate to situation.  Eyes: No scleral injection.  HENT: No OP obstruction.  Head: Normocephalic.  Cardiovascular: Normal rate and regular rhythm.  Respiratory: Effort normal, non-labored breathing.  GI: Soft.  No distension. There is no tenderness.  Skin: WDI.   Neurologic Examination   awake and alert, oriented to self. His exam is waxing and waning. Speech is dysarthric, no aphasia noted. Right hemibody is plegic from prior stroke, and left arm with some movement but can not hold against gravity, left leg no movement, noted asterixis in left arm and left leg,  sensation intact bilaterally but has some left hemibody neglect to painful stimuli on left toes.   Labs/Imaging/Neurodiagnostic studies   CBC:  Recent Labs  Lab 09-Aug-2024 0520 07/19/24 0329  WBC 14.9* 13.0*  HGB 10.4* 8.7*  HCT 32.1* 26.7*  MCV  86.1 86.7  PLT 182 150   Basic Metabolic Panel:  Lab Results  Component Value Date   NA 143 07/19/2024   K 4.0 07/19/2024   CO2 23 07/19/2024   GLUCOSE 122 (H) 07/19/2024   BUN 25 (H) 07/19/2024   CREATININE 1.71 (H) 07/19/2024   CALCIUM  8.5 (L) 07/19/2024   GFRNONAA 42 (L) 07/19/2024   GFRAA 46 (L) 02/23/2020   Lipid Panel:  Lab Results  Component Value Date   LDLCALC 53 08-09-24   HgbA1c:  Lab Results  Component Value Date   HGBA1C 5.8 (H) 03/07/2024   Urine Drug Screen:     Component Value Date/Time   LABOPIA NONE DETECTED 03/07/2024 1405   COCAINSCRNUR NONE DETECTED 03/07/2024 1405   LABBENZ NONE DETECTED 03/07/2024 1405   AMPHETMU NONE DETECTED 03/07/2024 1405   THCU NONE DETECTED 03/07/2024 1405   LABBARB NONE DETECTED 03/07/2024 1405    Alcohol Level     Component Value Date/Time   ETH <15 03/07/2024 1208   INR  Lab Results  Component Value Date   INR 1.5 (H) 07/10/2024   APTT  Lab Results  Component Value Date   APTT 54 (H) 07/10/2024   AED levels: No results found for: PHENYTOIN, ZONISAMIDE, LAMOTRIGINE, LEVETIRACETA  CT Head without contrast(Personally reviewed): No acute process. Stable remote infarcts in left corona radiata, posterior internal capsule and left thalamus.   CT angio Head and Neck with contrast(Personally reviewed): NO LVO    ASSESSMENT   Kyle  JOSIYAH [name] is a 73 y.o. male  hx of A fib on Eliquis   currently on hold for recent surgery, prior hx of ICH stroke with right side hemiplegia, PAD, PVD, COPD, CKD, CHF, dilated cardiomyopathy, pulmonary hypertension, HTN, DM, HLD, aortic atherosclerosis, Hx of seizures on home Keppra , neuropathy  Who was admitted to the hospital on 9/22 following a right pseudoaneurysm repair with vascular surgery.  Code stroke activated for acute onset of left side weakness and slurred speech and confusion. LKW unclear.  RECOMMENDATIONS  Will check rEEG to evaluate for seizures  MRI brain.  If positive for stroke will continue with full stroke workup  Neurology will continue to follow  ______________________________________________________________________   Signed, Karna DELENA Geralds, NP [facility]  I have seen the patient and reviewed the above note.  He had abrupt onset altered mental status and there was report of left-sided weakness at onset.  When I initially evaluated him, he was densely encephalopathic and appeared to have some negative myoclonus when testing strength on the left.  He did receive Dilaudid  shortly before my evaluation, and so I do think that medication effect is one possibility, particularly with the negative myoclonus.  He did improve relatively rapidly over the course of a couple hours, which I do think could be consistent with the half-life of Dilaudid , but he does have a history of seizures as well and so I think this also has to be a consideration.  I am not sure I make any adjustments to his medications for this episode unless he were to have significant irritability on his EEG.  Workup as above, be cautious with sedating medications, neurology will follow.  Aisha Seals, MD [facility]   If 7pm- 7am, please page neurology on call as listed in AMION.

## 2024-07-19 NOTE — Assessment & Plan Note (Signed)
" >>  ASSESSMENT AND PLAN FOR PAD (PERIPHERAL ARTERY DISEASE) WRITTEN ON 07/19/2024  7:56 AM BY [name], MD  Extensive history of surgical repair (bypass grafts); most recently above. - Crestor  10 mg daily; lipids well controlled on this; can consider increasing outpatient with PCP "

## 2024-07-19 NOTE — Assessment & Plan Note (Signed)
 Wound care as appropriate

## 2024-07-19 NOTE — Assessment & Plan Note (Addendum)
 POD 2 s/p right femoral artery pseudoaneurysm repair. Wound VAC present in right groin. Patient denies pain at surgical site. - Vascular surgery following - Antibiotics: IV Vancomycin  (9/22- ), IV Zosyn  (9/22- ) - Per vascular, planning to continue broad-spectrum antibiotics until culture results - Pain: Dilaudid  0.5 mg IV Q3H prn for severe pain, Oxycodone  5 mg q6h for moderate pain, and Tylenol  650 mg Q4H for mild pain - S/p 2 units PRBCs. Hgb down-trending from 10.4 --> 8.7 - Bowel regimen: Miralax  and Senna daily prn for constipation - Incentive spirometry - Wound culture: NGTD - VAC in place - PT/OT eval and treat - Labs: AM BMP, CBC

## 2024-07-19 NOTE — Assessment & Plan Note (Addendum)
 History of stage 3b CKD. Cr 1.45 on admission. Elevated to 1.71. Baseline appears to be ~1.4. Likely related to limited PO intake post-op, antibiotic regimen (vanc, zosyn ).  -CTM -AM BMP

## 2024-07-19 NOTE — Plan of Care (Addendum)
 FMTS Brief Progress Note  S: [name] is a 73 y.o. male w/ pseudoaneurysm Right femoral artery. He is now s/p repair of right femoral common pseudoaneurysm with right CFA to profunda bypass and revision of right CFA to peroneal vein graft, right LE thrombectomy and Sartorius muscle flap right groin with wound vac placement.  Patient w/ previous episode of tachycardia, AMS, with increase oxygen  requirement earlier today. He is at his baseline this evening. Ox3. States doing well w/ well pain control.    O: BP (!) 128/52 (BP Location: Left Arm)   Pulse (!) 107   Temp 98.8 F (37.1 C) (Oral)   Resp 13   Ht 5' 11 (1.803 m)   Wt 68.9 kg   SpO2 97%   BMI 21.19 kg/m    Pulmonary:     Effort: Pulmonary effort is normal.     Breath sounds: Rhonchi  Bilateral Lower Extremities warm to touch  Right incision site w/ wound vac CDI, soft - no evidence of hematoma, JP drain has been removed Neurological:     Mental Status: He is alert. Ox3-4    Sensory: Sensory deficit present. At base line     Motor: Weakness (RUE and RLE from hx of stroke) present. At baseline  Psychiatric:        Mood and Affect: Mood normal.        Behavior: Behavior normal.  Imaging EXAM: CTA CHEST 07/19/2024 07:34:00 PM  IMPRESSION: 1. No evidence of pulmonary embolism. 2. Enlarged central pulmonary arteries consistent with pulmonary arterial hypertension. 3. Severe emphysema and bibasilar consolidation, possibly due to aspiration or multifocal infection. 4. Confluent pathologic mediastinal and bilateral hilar adenopathy, minimally enlarged since prior examination of 01/05/2024, possibly reactive, inflammatory, or lymphoproliferative in nature. 5. 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 Additional disease within the upper abdomen is incompletely evaluated on  this examination. Vascular consultation may be helpful for further management, particularly if there is evidence of distal embolization.   Electronically signed by: Dorethia Molt MD 07/19/2024 08:11 PM EDT RP Workstation: [id number]   A/P: [name] is at baseline on mental status and neuro exam. He is able to communicate appropriately. Still on 10L HFNC to maintain Oxygen  Sat above 92%. His lungs sounds rhonchi bilaterally  - please encourage patient to use of Incentive Spirometer  - Continue IV Zosyn  and Linezolid  - Continue on Tele - Neuro check Q4H - Please notify primary if his status change  Kyle Houston NOVAK, DO 07/19/2024, 8:04 PM PGY-1, [facility] Night Resident  Please page [phone] with questions.

## 2024-07-19 NOTE — Assessment & Plan Note (Signed)
 Extensive history of surgical repair (bypass grafts); most recently above. - Crestor  10 mg daily; lipids well controlled on this; can consider increasing outpatient with PCP

## 2024-07-19 NOTE — Progress Notes (Signed)
 Pharmacy Antibiotic Note  Kyle [name] is a 73 y.o. male admitted on 07/17/2024 with R groin infection.  Patient is s/p VVS intervention.  Pharmacy has been consulted for Vancomycin  and Zosyn  dosing.  Today is day#3 of antibiotic therapy.  Tissue cx from OR remains ngtd.  SCr up, 1.4 > 1.7 necessitating Vancomycin  dose adjustment.  Plan: Zosyn  3.375gm IV q8h (4hr extended infusions) Change Vancomycin  1500mg  IV q48h for estimated AUC 420 using SCr 1.7 Check vancomycin  levels at steady state, goal AUC 400-550 Follow up renal function, cultures as available, clinical progress, length of tx  Height: 5' 11 (180.3 cm) Weight: 68.9 kg (151 lb 14.4 oz) IBW/kg (Calculated) : 75.3  Temp (24hrs), Avg:98.7 F (37.1 C), Min:98.4 F (36.9 C), Max:99.6 F (37.6 C)  Recent Labs  Lab 07/17/24 0943 07/17/24 0953 07/17/24 2030 07/18/24 0520 07/19/24 0329  WBC  --   --  15.3* 14.9* 13.0*  CREATININE 0.80 1.30* 1.45* 1.65* 1.71*    Estimated Creatinine Clearance: 38.1 mL/min (A) (by C-G formula based on SCr of 1.71 mg/dL (H)).    No Known Allergies  Antimicrobials this admission: Vanc 9/22 >>  Zosyn  9/22 >>   Dose adjustments this admission: 9/24 with SCr bump 1.3 > 1.7  Microbiology results: 9/22 tissue cx: ngtd 9/22 MRSA PCR neg, SA pos  Thank you for allowing pharmacy to be a part of this patient's care.  [employer], Pharm.D., BCPS Clinical Pharmacist Clinical phone for 07/19/2024 from 7:30-3:00 is [phone].  **Pharmacist phone directory can be found on [email or web address] listed under [facility].  07/19/2024 12:47 PM

## 2024-07-19 NOTE — Progress Notes (Addendum)
 Progress Note    07/19/2024 6:49 AM 2 Days Post-Op  Subjective:  says he is feeling better.  Says groin is feeling better.  Afebrile HR 60's-90's NSR 130's-140's systolic 90% HFNC 8L (down from 12L midday yesterday)  Vitals:   07/19/24 0100 07/19/24 0400  BP: 132/65 (!) 139/53  Pulse: 76 66  Resp: 16 13  Temp:  98.4 F (36.9 C)  SpO2: 91% 90%    Physical Exam: General:  no distress Cardiac:  regular Lungs:  non labored Incisions:  right groin with vac with good seal.  Extremities:  brisk doppler flow right peroneal.  Right calf and anterior compartments are soft and non tender.    CBC    Component Value Date/Time   WBC 13.0 (H) 07/19/2024 0329   RBC 3.08 (L) 07/19/2024 0329   HGB 8.7 (L) 07/19/2024 0329   HGB 14.8 02/12/2022 0943   HCT 26.7 (L) 07/19/2024 0329   HCT 45.0 02/12/2022 0943   PLT 150 07/19/2024 0329   PLT 174 02/12/2022 0943   MCV 86.7 07/19/2024 0329   MCV 95 02/12/2022 0943   MCH 28.2 07/19/2024 0329   MCHC 32.6 07/19/2024 0329   RDW 23.9 (H) 07/19/2024 0329   RDW 15.0 02/12/2022 0943   LYMPHSABS 1.4 07/10/2024 1650   LYMPHSABS 1.5 02/12/2022 0943   MONOABS 0.8 07/10/2024 1650   EOSABS 0.2 07/10/2024 1650   EOSABS 0.1 02/12/2022 0943   BASOSABS 0.0 07/10/2024 1650   BASOSABS 0.0 02/12/2022 0943    BMET    Component Value Date/Time   NA 143 07/19/2024 0329   NA 145 (H) 02/03/2023 0927   K 4.0 07/19/2024 0329   CL 111 07/19/2024 0329   CO2 23 07/19/2024 0329   GLUCOSE 122 (H) 07/19/2024 0329   BUN 25 (H) 07/19/2024 0329   BUN 22 02/03/2023 0927   CREATININE 1.71 (H) 07/19/2024 0329   CREATININE 0.99 11/12/2014 1344   CALCIUM  8.5 (L) 07/19/2024 0329   GFRNONAA 42 (L) 07/19/2024 0329   GFRAA 46 (L) 02/23/2020 0421    INR    Component Value Date/Time   INR 1.5 (H) 07/10/2024 1650     Intake/Output Summary (Last 24 hours) at 07/19/2024 0649 Last data filed at 07/19/2024 0500 Gross per 24 hour  Intake 1702.84 ml  Output  1055 ml  Net 647.84 ml    Component Ref Range & Units (hover) 2 d ago  Specimen Description TISSUE  Special Requests RIGHT GROIN PSEUDOANEURYSM FOR CULTURE  Gram Stain FEW WBC PRESENT, PREDOMINANTLY PMN NO ORGANISMS SEEN  Culture NO GROWTH < 24 HOURS Performed at [facility], [address], [place], [name] [address]  Report Status PENDING     JP output:  30cc/24hr Vac output:  320cc/24hr    Assessment/Plan:  73 y.o. male is s/p:  Repair of right common femoral pseudoaneurysm, right common femoral artery to profunda bypass with 8 mm dacron graft, revision of right common femoral to peroneal vein graft with new proximal anastomosis to dacron interposition, right lower extremity thrombectomy, sartorius muscle flap right groin for coverage of dacron graft, 15 French drain placement right sartorius muscle bed, and wound VAC right groin wound 07/17/2024 by Dr. Gretta   2 Days Post-Op   -pt continues to have brisk doppler flow right peroneal.   -wound vac with good seal.   Vac output 320cc/24 hr.  JP with 30cc/24h.  Will d/w Dr. Gretta when to remove JP drain.   -intra-op cx still  with no growth.  Continue broad spectrum abx until final read -leukocytosis improving -hx CKD .  Creatinine 1.7 up from 1.65 yesterday.  Continue to monitor-management per primary team. -DVT prophylaxis:  sq heparin  -will remove vac early next week.  I have put in for [facility] and face to face for [facility] for home vac.   Lucie Apt, PA-C [facility] [phone] 07/19/2024 6:49 AM  I have seen and evaluated the patient. I agree with the PA note as documented above.  Right groin looks good with VAC.  Will remove drain in sartorius muscle bed today given minimal output.  Have asked nursing to get IV access to remove his triple-lumen from the neck.  Continue broad-spectrum antibiotics.  No growth on cultures.  Will need home VAC.  Right foot excellent Doppler signals.  Lonni DOROTHA Gaskins, MD [facility] [place] Office: [phone]

## 2024-07-19 NOTE — Progress Notes (Signed)
 Patient's BP is on higher side, he was complaining of right groin pain, analgesic given as per the order, BP remains unchanged, denies any symptoms, MD notified.    07/19/24 1430  Vitals  BP (!) 182/76  MAP (mmHg) 104  BP Location Left Arm  BP Method Automatic  Patient Position (if appropriate) Lying  Pulse Rate (!) 110  Pulse Rate Source Monitor  ECG Heart Rate (!) 113  Resp 18  Level of Consciousness  Level of Consciousness Alert  MEWS COLOR  MEWS Score Color Yellow  Oxygen  Therapy  SpO2 92 %  O2 Device HFNC  O2 Flow Rate (L/min) 10 L/min  MEWS Score  MEWS Temp 0  MEWS Systolic 0  MEWS Pulse 2  MEWS RR 0  MEWS LOC 0  MEWS Score 2

## 2024-07-19 NOTE — Progress Notes (Signed)
   07/19/24 1434  TOC Brief Assessment  Insurance and Status Reviewed  Patient has primary care physician Yes  Home environment has been reviewed home with wife  Prior level of function: assisted 24/7 by wife  Prior/Current Home Services Current home services (active with [facility]- for RN/PT/OT)  Social Drivers of Health Review SDOH reviewed no interventions necessary  Readmission risk has been reviewed Yes    Note pt will need home wound VAC on discharge, order form for home VAC has been placed on chart for provider signature.  Pt is active with [facility] for HHPT/OT/RN- per [facility] liaison they are still under a delay for HHOT.   Note updated recommendations today per PT note to SNF- will need to follow up on discharge plan with pt and wife- SNF vs return home w/ [name]  Inpatient Care Management (ICM) will continue to monitor patient advancement through interdisciplinary progression rounds. If new patient transition needs arise, please place a ICM (CM/CSW) consult.

## 2024-07-19 NOTE — Progress Notes (Signed)
 Patient received from CT, V/S were WNL, he was more alert and following commands well, was able to move his left upper and lower extremities, denied any pain and discomfort, was able to eat after set up, updated MD,.    07/19/24 1709  Vitals  Temp 98.8 F (37.1 C)  Temp Source Oral  BP (!) 128/52  MAP (mmHg) 75  BP Location Left Arm  BP Method Automatic  Patient Position (if appropriate) Lying  Pulse Rate (!) 107  Pulse Rate Source Monitor  ECG Heart Rate (!) 106  Resp 13  Level of Consciousness  Level of Consciousness Alert  MEWS COLOR  MEWS Score Color Yellow  Oxygen  Therapy  SpO2 97 %  O2 Device HFNC  O2 Flow Rate (L/min) 10 L/min  MEWS Score  MEWS Temp 0  MEWS Systolic 0  MEWS Pulse 1  MEWS RR 1  MEWS LOC 0  MEWS Score 2

## 2024-07-19 NOTE — Code Documentation (Signed)
 Stroke Response Nurse Documentation Code Documentation  Kyle [name] is a 73 y.o. male admitted to [facility]  on 9/23 for pseudoaneurysm repair with past medical hx of CVA, DM, HTN. On No antithrombotic. Code stroke was activated by 4E RN .   Patient on 4E where he was LKW at unknown time and now complaining of L sided weakness, slurred speech, and aphasia. Patient also newly febrile at this time.    Stroke team at the bedside after patient activation. Patient to CT with team. NIHSS 14, see documentation for details and code stroke times. Patient with disoriented, bilateral arm weakness, bilateral leg weakness, Expressive aphasia , dysarthria , and Sensory  neglect on exam. The following imaging was completed:  CT Head, CTA, and CTP. Patient is not a candidate for IV Thrombolytic due to unclear LKW. Patient is not a candidate for IR due to no LVO.   Care/Plan: LP    Process Delays Noted: none     Lauraine LITTIE Searle  Stroke Response RN

## 2024-07-19 NOTE — Progress Notes (Signed)
 cODE STRKE CALLED AT 1600 07-19-24

## 2024-07-19 NOTE — Progress Notes (Signed)
 When this RN walked in the patient'[s room, HR was in 110s-120s, BP was still on higher side, patient's left hand was on his right groin, and he responded yes when asked  if he was having pain, analgesics given. Patient's lunch tray was in the room, patient was not able to lift his left hand up when asked to hold the utensils to eat, he was not answering questions appropriately and speech was more slurred and incomprehensible. Informed to the [facility] medicine team and vascular On call MD. Rectal tynol given for pain.   Family medicine team was at bedside, code stroke activated, CBG was 127, patient transported to CT along with code stroke team.

## 2024-07-20 ENCOUNTER — Inpatient Hospital Stay (HOSPITAL_COMMUNITY)

## 2024-07-20 DIAGNOSIS — R569 Unspecified convulsions: Secondary | ICD-10-CM | POA: Diagnosis not present

## 2024-07-20 DIAGNOSIS — G928 Other toxic encephalopathy: Secondary | ICD-10-CM | POA: Diagnosis not present

## 2024-07-20 DIAGNOSIS — T402X1A Poisoning by other opioids, accidental (unintentional), initial encounter: Secondary | ICD-10-CM

## 2024-07-20 DIAGNOSIS — T402X5A Adverse effect of other opioids, initial encounter: Secondary | ICD-10-CM | POA: Diagnosis not present

## 2024-07-20 DIAGNOSIS — G4089 Other seizures: Secondary | ICD-10-CM

## 2024-07-20 LAB — CBC
HCT: 27.1 % — ABNORMAL LOW (ref 39.0–52.0)
Hemoglobin: 8.7 g/dL — ABNORMAL LOW (ref 13.0–17.0)
MCH: 28 pg (ref 26.0–34.0)
MCHC: 32.1 g/dL (ref 30.0–36.0)
MCV: 87.1 fL (ref 80.0–100.0)
Platelets: 150 K/uL (ref 150–400)
RBC: 3.11 MIL/uL — ABNORMAL LOW (ref 4.22–5.81)
RDW: 23.1 % — ABNORMAL HIGH (ref 11.5–15.5)
WBC: 15.8 K/uL — ABNORMAL HIGH (ref 4.0–10.5)
nRBC: 0 % (ref 0.0–0.2)

## 2024-07-20 LAB — GLUCOSE, CAPILLARY
Glucose-Capillary: 123 mg/dL — ABNORMAL HIGH (ref 70–99)
Glucose-Capillary: 133 mg/dL — ABNORMAL HIGH (ref 70–99)
Glucose-Capillary: 117 mg/dL — ABNORMAL HIGH (ref 70–99)
Glucose-Capillary: 166 mg/dL — ABNORMAL HIGH (ref 70–99)

## 2024-07-20 LAB — BASIC METABOLIC PANEL WITH GFR
Anion gap: 7 (ref 5–15)
BUN: 22 mg/dL (ref 8–23)
CO2: 21 mmol/L — ABNORMAL LOW (ref 22–32)
Calcium: 8.1 mg/dL — ABNORMAL LOW (ref 8.9–10.3)
Chloride: 112 mmol/L — ABNORMAL HIGH (ref 98–111)
Creatinine, Ser: 1.55 mg/dL — ABNORMAL HIGH (ref 0.61–1.24)
GFR, Estimated: 47 mL/min — ABNORMAL LOW (ref >60)
Glucose, Bld: 152 mg/dL — ABNORMAL HIGH (ref 70–99)
Potassium: 3.8 mmol/L (ref 3.5–5.1)
Sodium: 140 mmol/L (ref 135–145)
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
## 2024-07-20 NOTE — Assessment & Plan Note (Addendum)
 COPD - 4L O2 via [place] at baseline. Home regimen of Trelegy Ellipta  1 puff daily (on Breztri  formulary equivalent); requiring additional O2 HFNC since admission A fib - Rate controlled with carvedilol  6.25 mg twice daily. Hold home Eliquis  5 mg twice daily in the setting of being on heparin  HTN - Continue home carvedilol  6.25 mg BID; Hold home regimen of amlodipine  5 mg daily, hydralazine  25 mg daily while borderline normo-hypertensive HLD - Home regimen of Crestor  10 mg daily; LDL of 53, well controlled T2DM - Last A1c 4 months ago, 5.8. Home regimen of Insulin  5u daily in the AM. Will see how he tolerates PO and monitor CBGs w/ SSI and add Lantus  prn. HFpEF - Last echo 08/2023 with EF of 55-60%. Home regimen of carvedilol  6.25 mg twice daily Hx seizure - Home regimen of Keppra  500 mg twice daily  Anxiety/Depression - Home regimen of mirtazapine  7.5 mg daily  GERD - Home regimen of Protonix  40 mg daily  Neuropathy - Home gabapentin  300 mg AM + 600 mg PM

## 2024-07-20 NOTE — Progress Notes (Addendum)
 Progress Note    07/20/2024 8:07 AM 3 Days Post-Op  Subjective: No complaints    Vitals:   07/20/24 0600 07/20/24 0803  BP: (!) 141/54 (!) 119/54  Pulse: 91 85  Resp: 14 16  Temp:  99.2 F (37.3 C)  SpO2:      Physical Exam: General: Laying in bed, NAD Lungs: Nonlabored Incisions: Right groin with wound VAC with good seal Extremities: Brisk right peroneal Doppler signal  CBC    Component Value Date/Time   WBC 15.8 (H) 07/20/2024 0235   RBC 3.11 (L) 07/20/2024 0235   HGB 8.7 (L) 07/20/2024 0235   HGB 14.8 02/12/2022 0943   HCT 27.1 (L) 07/20/2024 0235   HCT 45.0 02/12/2022 0943   PLT 150 07/20/2024 0235   PLT 174 02/12/2022 0943   MCV 87.1 07/20/2024 0235   MCV 95 02/12/2022 0943   MCH 28.0 07/20/2024 0235   MCHC 32.1 07/20/2024 0235   RDW 23.1 (H) 07/20/2024 0235   RDW 15.0 02/12/2022 0943   LYMPHSABS 1.4 07/10/2024 1650   LYMPHSABS 1.5 02/12/2022 0943   MONOABS 0.8 07/10/2024 1650   EOSABS 0.2 07/10/2024 1650   EOSABS 0.1 02/12/2022 0943   BASOSABS 0.0 07/10/2024 1650   BASOSABS 0.0 02/12/2022 0943    BMET    Component Value Date/Time   NA 140 07/20/2024 0235   NA 145 (H) 02/03/2023 0927   K 3.8 07/20/2024 0235   CL 112 (H) 07/20/2024 0235   CO2 21 (L) 07/20/2024 0235   GLUCOSE 152 (H) 07/20/2024 0235   BUN 22 07/20/2024 0235   BUN 22 02/03/2023 0927   CREATININE 1.55 (H) 07/20/2024 0235   CREATININE 0.99 11/12/2014 1344   CALCIUM  8.1 (L) 07/20/2024 0235   GFRNONAA 47 (L) 07/20/2024 0235   GFRAA 46 (L) 02/23/2020 0421    INR    Component Value Date/Time   INR 1.5 (H) 07/10/2024 1650     Intake/Output Summary (Last 24 hours) at 07/20/2024 9192 Last data filed at 07/20/2024 0404 Gross per 24 hour  Intake 736.59 ml  Output 1100 ml  Net -363.41 ml      Assessment/Plan:  73 y.o. male is 3 days postop, s/p: Repair of right common femoral pseudoaneurysm, right common femoral artery to profunda bypass with 8 mm dacron graft, revision of  right common femoral to peroneal vein graft with new proximal anastomosis to dacron interposition, right lower extremity thrombectomy, sartorius muscle flap right groin for coverage of dacron graft, 15 French drain placement right sartorius muscle bed, and wound VAC right groin wound 07/17/2024 by Dr. Gretta     - He says he is feeling okay this morning, denies any pain -Right groin with wound VAC with good seal, serosanguineous output in canister -No growth on IntraOp cultures, continue IV antibiotics until final cultures come back -Right lower extremity remains well-perfused with brisk peroneal Doppler signal -He can mobilize as tolerated.  Will likely discharge to SNF   [facility], PA-C [facility] [phone] 07/20/2024 8:07 AM  I have seen and evaluated the patient. I agree with the PA note as documented above.  Status post complicated repair of right femoral pseudoaneurysm on Monday setting of prior aortobifem and right femoral to peroneal bypass. Noted code stroke was called yesterday evening.  Much more alert today.  Drain removed from sartorius muscle bed.  VAC with good seal in the right groin.  I did change the canister in the room with serosanguinous output.  Next  VAC change would likely be Monday to allow the muscle flap to scar in place.  Then hopefully can transition home or to SNF with vac.  Brisk Doppler signal in the right foot.  No growth on cultures.  Broad-spectrum antibiotics pending final culture data as these often are infectious in nature.  Lonni DOROTHA Gaskins, MD [facility] [place] Office: [phone]

## 2024-07-20 NOTE — Progress Notes (Signed)
 EEG complete - results pending
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
## 2024-07-20 NOTE — Assessment & Plan Note (Signed)
 Code Stroke called yesterday 9/24 as patient had AMS, L sided deficits, tachycardia, and was febrile to 103 F. CT Head negative for acute stroke. CTA with no acute findings, high grade stenosis of V4 and R ICA stenosis of 50%. CTPE showed no evidence of PE. MRI brain with no acute intracranial abnormalities. Patient back to baseline mental status, afebrile, moving left extremities normally, VSS.  -CTM

## 2024-07-20 NOTE — Progress Notes (Signed)
 NEUROLOGY CONSULT FOLLOW UP NOTE   Date of service: July 20, 2024 Patient Name: Kyle [name] MRN:  [id number] DOB:  April 17, 1951  Interval Hx/subjective   No family at the bedside.  Patient laying in the bed in no apparent distress. He states he feels pretty good today has not had any more events with altered mental status or weakness.  He says he is back to normal.  He states he thinks it is all from the pain medicine Routine EEG with intermittent slowing generalized and lateralized left hemisphere, no seizures identified Vitals   Vitals:   07/20/24 0835 07/20/24 1000 07/20/24 1236 07/20/24 1548  BP:  (!) 122/56 128/61 (!) 103/42  Pulse:   90 78  Resp:  14 16 15   Temp:   99.2 F (37.3 C) 98.4 F (36.9 C)  TempSrc:   Oral Oral  SpO2: 100%  100% 100%  Weight:      Height:         Body mass index is 21.19 kg/m.  Physical Exam   Constitutional: Appears well-developed and well-nourished.   Psych: Affect appropriate to situation.   Eyes: No scleral injection.   HENT: No OP obstrucion.   Head: Normocephalic.   Cardiovascular: Normal rate and regular rhythm.   Respiratory: Effort normal, non-labored breathing.   GI: Soft.  No distension. There is no tenderness.   Skin: WDI.    Neurologic Examination    Mental Status -  Level of arousal and orientation to time, place, and person were intact . Language including expression, naming, repetition, comprehension was assessed and found intact . Attention span and concentration were normal . Recent and remote memory were intact . Fund of Knowledge was assessed and was intact .  Cranial Nerves II - XII - II - Visual field intact OU . III, IV, VI - Extraocular movements intact . V - Facial sensation intact bilaterally . VII - Facial movement intact bilaterally . VIII - Hearing & vestibular intact bilaterally . X - Palate elevates symmetrically . XI - Chin turning & shoulder shrug intact bilaterally . XII - Tongue  protrusion intact .  Motor Strength - The patient's strength was normal in left arm and leg.  Right arm and leg plegic from prior stroke bulk was normal and fasciculations were absent .   Motor Tone - Muscle tone was assessed at the neck and appendages and was normal . Sensory -sensation intact bilaterally Coordination -no ataxia on left Gait and Station - deferred.  Medications  Current Facility-Administered Medications:    0.9 %  sodium chloride  infusion, 10 mL/hr, Intravenous, Once, Rhyne, Samantha J, PA-C   0.9 %  sodium chloride  infusion, 500 mL, Intravenous, Once PRN, Rhyne, Samantha J, PA-C   acetaminophen  (TYLENOL ) tablet 650 mg, 650 mg, Oral, Q4H PRN **OR** acetaminophen  (TYLENOL ) suppository 650 mg, 650 mg, Rectal, Q4H PRN, Janna, Adriana, DO, 650 mg at 07/19/24 1547   acetaminophen  (TYLENOL ) suppository 975 mg, 975 mg, Rectal, Once, [name], MD   amLODipine  (NORVASC ) tablet 5 mg, 5 mg, Oral, QHS, Gomes, Adriana, DO, 5 mg at 07/19/24 2221   budesonide -glycopyrrolate -formoterol  (BREZTRI ) 160-9-4.8 MCG/ACT inhaler 2 puff, 2 puff, Inhalation, BID, Gomes, Adriana, DO, 2 puff at 07/20/24 0835   carvedilol  (COREG ) tablet 6.25 mg, 6.25 mg, Oral, BID WC, Gomes, Adriana, DO, 6.25 mg at 07/20/24 1734   Chlorhexidine  Gluconate Cloth 2 % PADS 6 each, 6 each, Topical, Daily, McDiarmid, Krystal BIRCH, MD, 6 each at 07/20/24 [phone]  gabapentin  (NEURONTIN ) capsule 300 mg, 300 mg, Oral, Daily, Gomes, Adriana, DO, 300 mg at 07/20/24 0919   gabapentin  (NEURONTIN ) capsule 600 mg, 600 mg, Oral, QHS, Gomes, Adriana, DO, 600 mg at 07/19/24 2220   heparin  injection 5,000 Units, 5,000 Units, Subcutaneous, Q8H, Rhyne, Samantha J, PA-C, 5,000 Units at 07/20/24 1427   HYDROmorphone  (DILAUDID ) injection 0.5 mg, 0.5 mg, Intravenous, Q3H PRN, Rhyne, Samantha J, PA-C, 0.5 mg at 07/19/24 1528   insulin  aspart (novoLOG ) injection 0-9 Units, 0-9 Units, Subcutaneous, TID WC, Gomes, Adriana, DO, 1 Units at 07/20/24  1312   levETIRAcetam  (KEPPRA ) tablet 500 mg, 500 mg, Oral, BID, Nygaard, Joseph, MD, 500 mg at 07/20/24 0919   linezolid  (ZYVOX ) IVPB 600 mg, 600 mg, Intravenous, Q12H, [name], MD, Last Rate: 300 mL/hr at 07/20/24 1123, 600 mg at 07/20/24 1123   mirtazapine  (REMERON ) tablet 7.5 mg, 7.5 mg, Oral, QHS, Gomes, Adriana, DO, 7.5 mg at 07/19/24 2222   mupirocin  ointment (BACTROBAN ) 2 % 1 Application, 1 Application, Nasal, BID, McDiarmid, Krystal BIRCH, MD, 1 Application at 07/20/24 0920   ondansetron  (ZOFRAN ) injection 4 mg, 4 mg, Intravenous, Q6H PRN, Rhyne, Samantha J, PA-C   oxyCODONE  (Oxy IR/ROXICODONE ) immediate release tablet 5 mg, 5 mg, Oral, Q6H PRN, Janna, Adriana, DO, 5 mg at 07/18/24 2249   pantoprazole  (PROTONIX ) EC tablet 40 mg, 40 mg, Oral, Daily, Gomes, Adriana, DO, 40 mg at 07/20/24 0919   phenol (CHLORASEPTIC) mouth spray 1 spray, 1 spray, Mouth/Throat, PRN, Rhyne, Samantha J, PA-C, 1 spray at 07/18/24 1433   piperacillin -tazobactam (ZOSYN ) IVPB 3.375 g, 3.375 g, Intravenous, Q8H, Billy Rocky SAUNDERS, RPH, Last Rate: 12.5 mL/hr at 07/20/24 1737, 3.375 g at 07/20/24 1737   polyethylene glycol (MIRALAX  / GLYCOLAX ) packet 17 g, 17 g, Oral, Daily PRN, Rhyne, Samantha J, PA-C   rosuvastatin  (CRESTOR ) tablet 10 mg, 10 mg, Oral, QHS, Gomes, Adriana, DO, 10 mg at 07/19/24 2221   senna (SENOKOT) tablet 8.6 mg, 1 tablet, Oral, Daily PRN, Janna Ferrier, DO  Labs and Diagnostic Imaging   CBC:  Recent Labs  Lab 07/19/24 0329 07/20/24 0235  WBC 13.0* 15.8*  HGB 8.7* 8.7*  HCT 26.7* 27.1*  MCV 86.7 87.1  PLT 150 150    Basic Metabolic Panel:  Lab Results  Component Value Date   NA 140 07/20/2024   K 3.8 07/20/2024   CO2 21 (L) 07/20/2024   GLUCOSE 152 (H) 07/20/2024   BUN 22 07/20/2024   CREATININE 1.55 (H) 07/20/2024   CALCIUM  8.1 (L) 07/20/2024   GFRNONAA 47 (L) 07/20/2024   GFRAA 46 (L) 02/23/2020   Lipid Panel:  Lab Results  Component Value Date   LDLCALC 53 07/18/2024    HgbA1c:  Lab Results  Component Value Date   HGBA1C 5.8 (H) 03/07/2024   Urine Drug Screen:     Component Value Date/Time   LABOPIA NONE DETECTED 03/07/2024 1405   COCAINSCRNUR NONE DETECTED 03/07/2024 1405   LABBENZ NONE DETECTED 03/07/2024 1405   AMPHETMU NONE DETECTED 03/07/2024 1405   THCU NONE DETECTED 03/07/2024 1405   LABBARB NONE DETECTED 03/07/2024 1405    Alcohol Level     Component Value Date/Time   ETH <15 03/07/2024 1208   INR  Lab Results  Component Value Date   INR 1.5 (H) 07/10/2024   APTT  Lab Results  Component Value Date   APTT 54 (H) 07/10/2024   AED levels: No results found for: PHENYTOIN, ZONISAMIDE, LAMOTRIGINE, LEVETIRACETA  CT Head without contrast(Personally  reviewed): No acute process. Stable remote infarcts in left corona radiata, posterior internal capsule and left thalamus.   CT angio Head and Neck with contrast(Personally reviewed): No LVO  MRI Brain(Personally reviewed): 1. No acute intracranial abnormality. 2. Sequelae of prior hemorrhage at the left lentiform nucleus with residual hemosiderin staining and associated wallerian degeneration. 3. Underlying chronic microvascular ischemic disease with a few additional scattered remote lacunar infarcts as above, stable. 4. Small air-fluid levels within the maxillary sinuses bilaterally.  rEEG 9/25:  - Intermittent slow, generalized and lateralized left hemisphere    IMPRESSION: This study is suggestive of cortical dysfunction arising from left hemisphere, non specific but likely secondary to underlying structural abnormality. Additionally there is mild diffuse encephalopathy. No seizures or epileptiform discharges were seen throughout the recording.    Assessment   VIAAN KNIPPENBERG is a 73 y.o. male hx of A fib on Eliquis   currently on hold for recent surgery, prior hx of ICH stroke with right side hemiplegia, PAD, PVD, COPD, CKD, CHF, dilated cardiomyopathy, pulmonary  hypertension, HTN, DM, HLD, aortic atherosclerosis, Hx of seizures on home Keppra , neuropathy  Who was admitted to the hospital on 9/22 following a right pseudoaneurysm repair with vascular surgery.  Code stroke activated for acute onset of left side weakness and slurred speech and confusion. LKW unclear. MRI brain with no acute process.  EEG with no seizures identified Differential diagnosis is encephalopathy with left-sided weakness likely due to medication effects from Dilaudid  and febrile vs complex seizure.  If patient should have another episode could consider increasing Keppra  dose to 750 mg twice daily  Recommendations  - Continue home Keppra  500mg  BID  - Seizure precautions  - Neurology will sign off. Please call with questions or concerns  ______________________________________________________________________   Signed, Karna DELENA Geralds, NP [facility]

## 2024-07-20 NOTE — Procedures (Signed)
 Patient Name: Kyle [name]  MRN: [id number]  Epilepsy Attending: Arlin MALVA Krebs  Referring Physician/Provider: Waddell Karna LABOR, NP  Date: 07/20/2024 Duration: 24.27 mins  Patient history: 73yo M with acute onset of left side weakness and slurred speech and confusion. EEG to evaluate for seizure  Level of alertness: Awake  AEDs during EEG study: GBP, LEV  Technical aspects: This EEG study was done with scalp electrodes positioned according to the 10-20 International system of electrode placement. Electrical activity was reviewed with band pass filter of 1-70Hz , sensitivity of 7 uV/mm, display speed of 62mm/sec with a 60Hz  notched filter applied as appropriate. EEG data were recorded continuously and digitally stored.  Video monitoring was available and reviewed as appropriate.  Description: The posterior dominant rhythm consists of 8Hz  activity of moderate voltage (25-35 uV) seen predominantly in posterior head regions, symmetric and reactive to eye opening and eye closing. EEG showed intermittent generalized and lateralized left hemisphere 3 to 6 Hz theta-delta slowing. Hyperventilation and photic stimulation were not performed.     ABNORMALITY - Intermittent slow, generalized and lateralized left hemisphere   IMPRESSION: This study is suggestive of cortical dysfunction arising from left hemisphere, non specific but likely secondary to underlying structural abnormality. Additionally there is mild diffuse encephalopathy. No seizures or epileptiform discharges were seen throughout the recording.  Chenita Ruda O Jorey Dollard

## 2024-07-20 NOTE — Assessment & Plan Note (Signed)
 History of stage 3b CKD. Cr 1.45 on admission. Down-trending from 1.71 --> 1.55. Baseline appears to be ~1.4. Likely related to limited PO intake post-op, prior antibiotic regimen (vancomycin ).  -CTM -AM BMP

## 2024-07-20 NOTE — Assessment & Plan Note (Signed)
" >>  ASSESSMENT AND PLAN FOR PAD (PERIPHERAL ARTERY DISEASE) WRITTEN ON 07/20/2024  7:52 AM BY [name], MD  Extensive history of surgical repair (bypass grafts); most recently above. - Crestor  10 mg daily; lipids well controlled on this; can consider increasing outpatient with PCP "

## 2024-07-20 NOTE — Progress Notes (Addendum)
 Pt is alert and fully oriented x 4, on HFNCL 10 LPM overnight, SPO2 97-100%, Rhonchi bilaterally on auscultations, normal respiratory effort, Breathing exercise with incentive spirometer is encouraged. Pt is afebrile, stable hemodynamically, NSR on the monitor, no acute distress. Neuro signs per document below, has not changed since previous shift. He is continuing on EEG monitoring after came back from MRI. Pt is able to rest well with no complaints. The incision on right groin with wound vac at -125 mmHg has serosanguinous drainage total 50 ml in the past 24 hours.No active drainage noted. Plan of care is reviewed. We will continue to monitor.    07/20/24 0404  NIH Stroke Scale   Dizziness Present No  Headache Present No  Interval Shift assessment  Level of Consciousness (1a.)    0  LOC Questions (1b. )    0  LOC Commands (1c. )    0  Best Gaze (2. )   0  Visual (3. )   0  Facial Palsy (4. )     0  Motor Arm, Left (5a. )    0  Motor Arm, Right (5b. )  3  Motor Leg, Left (6a. )   1  Motor Leg, Right (6b. )  3  Limb Ataxia (7. ) 0  Sensory (8. )   1  Best Language (9. )   1  Dysarthria (10. ) 1  Extinction/Inattention (11.)    0  Complete NIHSS TOTAL 10   Wendi Dash, RN

## 2024-07-20 NOTE — Assessment & Plan Note (Signed)
 Extensive history of surgical repair (bypass grafts); most recently above. - Crestor  10 mg daily; lipids well controlled on this; can consider increasing outpatient with PCP
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
## 2024-07-20 NOTE — Assessment & Plan Note (Signed)
 Wound care as appropriate
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
## 2024-07-21 DIAGNOSIS — D619 Aplastic anemia, unspecified: Secondary | ICD-10-CM | POA: Insufficient documentation

## 2024-07-21 DIAGNOSIS — D649 Anemia, unspecified: Secondary | ICD-10-CM | POA: Insufficient documentation

## 2024-07-21 LAB — GLUCOSE, CAPILLARY
Glucose-Capillary: 129 mg/dL — ABNORMAL HIGH (ref 70–99)
Glucose-Capillary: 415 mg/dL — ABNORMAL HIGH (ref 70–99)
Glucose-Capillary: 377 mg/dL — ABNORMAL HIGH (ref 70–99)
Glucose-Capillary: 87 mg/dL (ref 70–99)
Glucose-Capillary: 78 mg/dL (ref 70–99)
Glucose-Capillary: 110 mg/dL — ABNORMAL HIGH (ref 70–99)

## 2024-07-21 LAB — BASIC METABOLIC PANEL WITH GFR
Anion gap: 9 (ref 5–15)
BUN: 21 mg/dL (ref 8–23)
CO2: 22 mmol/L (ref 22–32)
Calcium: 8.1 mg/dL — ABNORMAL LOW (ref 8.9–10.3)
Chloride: 110 mmol/L (ref 98–111)
Creatinine, Ser: 1.65 mg/dL — ABNORMAL HIGH (ref 0.61–1.24)
GFR, Estimated: 44 mL/min — ABNORMAL LOW (ref >60)
Glucose, Bld: 110 mg/dL — ABNORMAL HIGH (ref 70–99)
Potassium: 3.5 mmol/L (ref 3.5–5.1)
Sodium: 141 mmol/L (ref 135–145)

## 2024-07-21 LAB — CBC
HCT: 23.8 % — ABNORMAL LOW (ref 39.0–52.0)
Hemoglobin: 7.7 g/dL — ABNORMAL LOW (ref 13.0–17.0)
MCH: 27.9 pg (ref 26.0–34.0)
MCHC: 32.4 g/dL (ref 30.0–36.0)
MCV: 86.2 fL (ref 80.0–100.0)
Platelets: 166 K/uL (ref 150–400)
RBC: 2.76 MIL/uL — ABNORMAL LOW (ref 4.22–5.81)
RDW: 22.9 % — ABNORMAL HIGH (ref 11.5–15.5)
WBC: 9 K/uL (ref 4.0–10.5)
nRBC: 0.2 % (ref 0.0–0.2)

## 2024-07-21 LAB — BPAM RBC
Blood Product Expiration Date: 202510152359
Blood Product Expiration Date: 202510152359
Blood Product Expiration Date: 202510152359
Blood Product Expiration Date: 202510192359
Blood Product Expiration Date: 202510222359
Blood Product Expiration Date: 202510232359
ISSUE DATE / TIME: 202509192147
ISSUE DATE / TIME: 202509201938
ISSUE DATE / TIME: 202509221122
ISSUE DATE / TIME: 202509221122
ISSUE DATE / TIME: 202509221712
ISSUE DATE / TIME: 202509221712
Unit Type and Rh: 5100
Unit Type and Rh: 5100
Unit Type and Rh: 5100
Unit Type and Rh: 5100
Unit Type and Rh: 5100
Unit Type and Rh: 5100

## 2024-07-21 LAB — TYPE AND SCREEN
ABO/RH(D): O POS
Antibody Screen: NEGATIVE
Unit division: 0
Unit division: 0
Unit division: 0
Unit division: 0
Unit division: 0
Unit division: 0
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
## 2024-07-21 NOTE — Assessment & Plan Note (Signed)
 Patient fully alert and oriented, VSS. No L sided deficits on exam. EEG w/ no seizures identified. Most likely encephalopathy w/ L sided weakness due to medication effects from Dilaudid  and febrile vs complex seizure. - Neurology signed off; recommended considering increasing Keppra  dose to 750 mg BID if patient has another episode - CTM

## 2024-07-21 NOTE — Progress Notes (Addendum)
 Pt is alert and awake, oriented x 4, normal respiratory effort, afebrile, stable hemodynamically, no aute distress noted overnight. Neuro signs has been stable, not changed from documented previous day.  He is able to rest well with no major complaints. We are able to wean down his O2 to his home baseline at 4 LPM. Spo2 is maintained above 95-100%. Pt has strong non productive cough. Effectively using incentive spirometer has been frequently encouraged. Right leg incision is negative for bleeding or hematoma.  He has very minimal serosanguinous drainage from wound vac, marked total at 65 ml in the past 24 hours in canister. Doppler pulses good signals on dorsalis pedis and posterior tibial bilaterally.Plan of care is reviewed. Pt has been progressing. We will continue to monitor.   Wendi Dash, RN

## 2024-07-21 NOTE — Assessment & Plan Note (Signed)
 COPD - 4L O2 via [place] at baseline. Home regimen of Trelegy Ellipta  1 puff daily (on Breztri  formulary equivalent); requiring additional O2 HFNC since admission A fib - Rate controlled with carvedilol  6.25 mg twice daily. Hold home Eliquis  5 mg twice daily in the setting of being on heparin  HTN - Continue home carvedilol  6.25 mg BID; Hold home regimen of amlodipine  5 mg daily, hydralazine  25 mg daily while borderline normo-hypertensive HLD - Home regimen of Crestor  10 mg daily; LDL of 53, well controlled T2DM - Last A1c 4 months ago, 5.8. Home regimen of Insulin  5u daily in the AM. Will see how he tolerates PO and monitor CBGs w/ SSI and add Lantus  prn. HFpEF - Last echo 08/2023 with EF of 55-60%. Home regimen of carvedilol  6.25 mg twice daily Hx seizure - Home regimen of Keppra  500 mg twice daily  Anxiety/Depression - Home regimen of mirtazapine  7.5 mg daily  GERD - Home regimen of Protonix  40 mg daily  Neuropathy - Home gabapentin  300 mg AM + 600 mg PM PAD - continue Crestor  10 mg daily; lipids well-controlled; can consider increasing outpatient with PCP

## 2024-07-21 NOTE — Progress Notes (Addendum)
 Progress Note    07/21/2024 8:01 AM 4 Days Post-Op  Subjective: No complaints, denies any pain    Vitals:   07/21/24 0600 07/21/24 0757  BP: 137/63   Pulse: 81   Resp: 16   Temp:    SpO2: 95% 97%    Physical Exam: General:  laying in bed, alert and oriented x3 Cardiac:  regular Lungs:  nonlabored Incisions:  right groin with wound VAC with good seal Extremities: Brisk right peroneal Doppler signal  CBC    Component Value Date/Time   WBC 9.0 07/21/2024 0315   RBC 2.76 (L) 07/21/2024 0315   HGB 7.7 (L) 07/21/2024 0315   HGB 14.8 02/12/2022 0943   HCT 23.8 (L) 07/21/2024 0315   HCT 45.0 02/12/2022 0943   PLT 166 07/21/2024 0315   PLT 174 02/12/2022 0943   MCV 86.2 07/21/2024 0315   MCV 95 02/12/2022 0943   MCH 27.9 07/21/2024 0315   MCHC 32.4 07/21/2024 0315   RDW 22.9 (H) 07/21/2024 0315   RDW 15.0 02/12/2022 0943   LYMPHSABS 1.4 07/10/2024 1650   LYMPHSABS 1.5 02/12/2022 0943   MONOABS 0.8 07/10/2024 1650   EOSABS 0.2 07/10/2024 1650   EOSABS 0.1 02/12/2022 0943   BASOSABS 0.0 07/10/2024 1650   BASOSABS 0.0 02/12/2022 0943    BMET    Component Value Date/Time   NA 141 07/21/2024 0315   NA 145 (H) 02/03/2023 0927   K 3.5 07/21/2024 0315   CL 110 07/21/2024 0315   CO2 22 07/21/2024 0315   GLUCOSE 110 (H) 07/21/2024 0315   BUN 21 07/21/2024 0315   BUN 22 02/03/2023 0927   CREATININE 1.65 (H) 07/21/2024 0315   CREATININE 0.99 11/12/2014 1344   CALCIUM  8.1 (L) 07/21/2024 0315   GFRNONAA 44 (L) 07/21/2024 0315   GFRAA 46 (L) 02/23/2020 0421    INR    Component Value Date/Time   INR 1.5 (H) 07/10/2024 1650     Intake/Output Summary (Last 24 hours) at 07/21/2024 0801 Last data filed at 07/21/2024 0600 Gross per 24 hour  Intake 1269.5 ml  Output 1265 ml  Net 4.5 ml      Assessment/Plan:  73 y.o. male is 4 days postop, s/p: Repair of right common femoral pseudoaneurysm, right common femoral artery to profunda bypass with 8 mm dacron graft,  revision of right common femoral to peroneal vein graft with new proximal anastomosis to dacron interposition, right lower extremity thrombectomy, sartorius muscle flap right groin for coverage of dacron graft, 15 French drain placement right sartorius muscle bed, and wound VAC right groin wound 07/17/2024 by Dr. Gretta    -He is feeling much better this morning and much more alert -Wound VAC to the right groin with good seal, continued serosanguineous output -Right lower extremity is warm and well-perfused with a brisk peroneal Doppler signal -Continue broad-spectrum antibiotics  -Okay to mobilize as tolerated -Will plan for wound vac change at the bedside on Monday   Ahmed Holster, PA-C [facility] [phone] 07/21/2024 8:01 AM  I have seen and evaluated the patient. I agree with the PA note as documented above.  First VAC change at bedside on Monday to allow muscle flap to scar in place.  VAC with good seal.  Brisk Doppler signals in the foot.  Overall looks good from my standpoint.  No growth on cultures.  When cultures are finalized we can stop antibiotics if nothing grows.  Lonni DOROTHA Gretta, MD [facility]  [place] Office: [phone]

## 2024-07-21 NOTE — Progress Notes (Signed)
 Orthopedic Tech Progress Note Patient Details:  Kyle [name] Sep 14, 1951 [id number]  Ortho Devices Type of Ortho Device: Shoulder immobilizer Ortho Device/Splint Location: RUE Ortho Device/Splint Interventions: Ordered, Application, Adjustment   Post Interventions Patient Tolerated: Well Instructions Provided: Care of device  Adine MARLA Blush 07/21/2024, 4:28 PM

## 2024-07-21 NOTE — Progress Notes (Signed)
 Physical Therapy Treatment Patient Details Name: Kyle [name] MRN: [id number] DOB: [birth year] Today's Date: 07/21/2024   History of Present Illness pt is a 73 yo male admitted for repair of R com fem pseudoaneurysm wit hR CFA to profunda bypass and revsion of R DVA to peroneal vein graft, R LLE thrombectomy and sartorius ms. flap with placement of R wound vac. 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    PT Comments  Pt received in supine, alert and agreeable to therapy session, pt with improved tolerance for activity this date and SpO2 94% and above on 6L HF [place]. Pt needing up to maxA to perform sit<>stand via face to face HHA and BLE blocked for stability and up to totalA to maintain x3 minutes during posterior hygiene. Pt c/o RUE shoulder discomfort and R shoulder appears very subluxed, RN/MD notified he would benefit from R shoulder sling for comfort with OOB mobility/postural changes. Pt needing increased assist for rolling and bed mobility still compared with initial evaluation, continue to recommend inpatient follow up therapy, <3 hours/day.     If plan is discharge home, recommend the following: A lot of help with bathing/dressing/bathroom;Assistance with cooking/housework;Assist for transportation;Help with stairs or ramp for entrance;Two people to help with walking and/or transfers   Can travel by private vehicle     No  Equipment Recommendations  [place] lift;Hospital bed;Other (comment) (pending progress)    Recommendations for Other Services       Precautions / Restrictions Precautions Precautions: Fall;Other (comment) Recall of Precautions/Restrictions: Intact Precaution/Restrictions Comments: watch O2 sats- [place] comes out of nares easily, wound VAC, R shoulder subluxation Required Braces or Orthoses: Sling (I asked MD to order him sling today due to chronic R shoulder subluxation with pain, check if it came in next session  please) Restrictions Weight Bearing Restrictions Per Provider Order: No     Mobility  Bed Mobility Overal bed mobility: Needs Assistance Bed Mobility: Rolling, Sidelying to Sit, Sit to Sidelying Rolling: Max assist, Used rails Sidelying to sit: Max assist, HOB elevated, Used rails     Sit to sidelying: Total assist, +2 for physical assistance, HOB elevated, Used rails General bed mobility comments: Cues for sequencing, bed rail use, line awareness, pt had difficulty fully rolling to his R side due to pain, multimodal cues today to reach for R bed rail, pt able to assist by single leg bridge with his hips toward R EOB. Increased assist (+2) returning to supine due to pt fatigue and discomfort, needs trunk and LE assist to get to supine safely. Would benefit from RUE sling for comfort/pt safety next session, RN/MD notified.    Transfers Overall transfer level: Needs assistance Equipment used: 2 person hand held assist Transfers: Sit to/from Stand Sit to Stand: Max assist, From elevated surface           General transfer comment: face to face STS from EOB with bil knees/feet blocked for safety/stability and pt using LUE to assist with balance on therapist forearm/back of elbow. RN/MD notified pt would benefit from RUE sling due to RUE discomfort and notable R shoulder subluxation. Static standing ~3 mins while NT assist with posterior hygiene    Ambulation/Gait                   Stairs             Wheelchair Mobility     Tilt Bed    Modified  [place] (Stroke Patients Only)       Balance Overall balance assessment: Needs assistance Sitting-balance support: Single extremity supported, Feet supported Sitting balance-Leahy Scale: Poor Sitting balance - Comments: pt using LUE to grasp mattress then L End of bed rail for support Postural control: Right lateral lean Standing balance support: During functional activity, Bilateral upper extremity supported Standing  balance-Leahy Scale: Poor Standing balance comment: MaxA to totalA to maintain static standing ~3 mins face to face assist with gait belt and HHA                            Communication Communication Communication: Impaired Factors Affecting Communication: Difficulty expressing self;Other (comment) (hx CVA)  Cognition Arousal: Alert Behavior During Therapy: Flat affect   PT - Cognitive impairments: No family/caregiver present to determine baseline, Initiation, Sequencing, Problem solving, Safety/Judgement                       PT - Cognition Comments: Good participation as able, pt appears more alert today. Following commands: Impaired Following commands impaired: Only follows one step commands consistently, Follows multi-step commands inconsistently, Follows one step commands with increased time    Cueing Cueing Techniques: Verbal cues, Gestural cues, Tactile cues  Exercises Other Exercises Other Exercises: pt pulling 500-1,050 on IS x 5 reps, cues for activity pacing and taking breaks between attempts    General Comments General comments (skin integrity, edema, etc.): SpO2 WFL on 6L HF [place] 94% sitting/standing (weaned down from 100% on 8L supine prior to EOB); R side of [place] tubing taped to his cheek using border of mepilex tape as it adheres better to his skin, pt denies discomfort; NT present and assisted him for posterior hygiene while pt standing with PTA at bedside.      Pertinent Vitals/Pain Pain Assessment Pain Assessment: PAINAD Breathing: occasional labored breathing, short period of hyperventilation Negative Vocalization: occasional moan/groan, low speech, negative/disapproving quality Facial Expression: facial grimacing Body Language: rigid, fists clenched, knees up, pushing/pulling away, strikes out Consolability: distracted or reassured by voice/touch PAINAD Score: 7 Pain Location: right LE and RUE shoulder/arm/wrist with postural changes and  STS Pain Descriptors / Indicators: Aching, Discomfort, Grimacing, Guarding Pain Intervention(s): Limited activity within patient's tolerance, Monitored during session, Repositioned    Home Living                          Prior Function            PT Goals (current goals can now be found in the care plan section) Acute Rehab PT Goals Patient Stated Goal: to go home PT Goal Formulation: With patient Time For Goal Achievement: 08/01/24 Progress towards PT goals: Progressing toward goals    Frequency    Min 2X/week      PT Plan      Co-evaluation              AM-PAC PT 6 Clicks Mobility   Outcome Measure  Help needed turning from your back to your side while in a flat bed without using bedrails?: Total Help needed moving from lying on your back to sitting on the side of a flat bed without using bedrails?: Total Help needed moving to and from a bed to a chair (including a wheelchair)?: Total Help needed standing up from a chair using your arms (e.g., wheelchair or bedside chair)?: Total Help needed to walk in  hospital room?: Total Help needed climbing 3-5 steps with a railing? : Total 6 Click Score: 6    End of Session Equipment Utilized During Treatment: Gait belt;Oxygen  Activity Tolerance: Patient tolerated treatment well Patient left: in bed;with call bell/phone within reach;with bed alarm set;Other (comment) (HOB ~50 deg) Nurse Communication: Mobility status;Other (comment) (pt needs RUE sling) PT Visit Diagnosis: Unsteadiness on feet (R26.81);Muscle weakness (generalized) (M62.81);Pain Pain - Right/Left: Right Pain - part of body: Leg     Time: 8556-8493 PT Time Calculation (min) (ACUTE ONLY): 23 min  Charges:    $Therapeutic Activity: 23-37 mins PT General Charges $$ ACUTE PT VISIT: 1 Visit                     Alejandro Adcox P., PTA Acute Rehabilitation Services Secure Chat Preferred 9a-5:30pm Office: [phone]    Connell HERO  [facility] 07/21/2024, 4:21 PM

## 2024-07-21 NOTE — Assessment & Plan Note (Signed)
 S/p 2 units pRBCs. Hgb 8.7 --> 7.7 overnight. Patient w possible ?transient hemoptysis.  - CTM, possible GI consult if does not resolve - Transfuse if Hgb < 7

## 2024-07-21 NOTE — Assessment & Plan Note (Signed)
 History of stage 3b CKD. Cr 1.45 on admission. Cr today stable at 1.65. Baseline appears to be ~1.4. Likely related to limited PO intake post-op, prior antibiotic regimen (vancomycin ), recent contrast.  -AM BMP

## 2024-07-21 NOTE — Plan of Care (Signed)
 Pt is alert and awake, oriented x 4, normal respiratory effort, afebrile, stable hemodynamically, no aute distress noted overnight. Neuro signs has been stable. He is able to rest well with no major complaints. On HFNCL at baseline 4 LPM. Spo2 is maintained above 95-100% at night time.  Pt has congested strong productive cough with tan brownish sputum. Effectively using incentive spirometer has been frequently encouraged.   Right leg incision is negative for bleeding or hematoma.  He has very minimal serosanguinous drainage from wound vac. Pain is well tolerated. He has not requested for any pain med.   Doppler pulses get signals on dorsalis pedis and posterior tibial bilaterally.  Plan of care is reviewed. Pt has been progressing. We will continue to monitor.    Problem: Clinical Measurements: Goal: Ability to maintain clinical measurements within normal limits will improve Outcome: Progressing Goal: Will remain free from infection Outcome: Progressing Goal: Diagnostic test results will improve Outcome: Progressing Goal: Respiratory complications will improve Outcome: Progressing Goal: Cardiovascular complication will be avoided Outcome: Progressing   Problem: Health Behavior/Discharge Planning: Goal: Ability to manage health-related needs will improve Outcome: Progressing   Problem: Activity: Goal: Risk for activity intolerance will decrease Outcome: Progressing   Problem: Nutrition: Goal: Adequate nutrition will be maintained Outcome: Progressing   Problem: Coping: Goal: Level of anxiety will decrease Outcome: Progressing   Problem: Elimination: Goal: Will not experience complications related to bowel motility Outcome: Progressing Goal: Will not experience complications related to urinary retention Outcome: Progressing   Problem: Pain Managment: Goal: General experience of comfort will improve and/or be controlled Outcome: Progressing   Problem: Safety: Goal: Ability  to remain free from injury will improve Outcome: Progressing   Problem: Skin Integrity: Goal: Risk for impaired skin integrity will decrease Outcome: Progressing   Problem: Clinical Measurements: Goal: Postoperative complications will be avoided or minimized Outcome: Progressing Goal: Signs and symptoms of graft occlusion will improve Outcome: Progressing   Problem: Fluid Volume: Goal: Ability to maintain a balanced intake and output will improve Outcome: Progressing   Problem: Metabolic: Goal: Ability to maintain appropriate glucose levels will improve Outcome: Progressing   Problem: Tissue Perfusion: Goal: Adequacy of tissue perfusion will improve Outcome: Progressing   Wendi Dash, RN
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
## 2024-07-22 DIAGNOSIS — I724 Aneurysm of artery of lower extremity: Secondary | ICD-10-CM | POA: Diagnosis not present

## 2024-07-22 LAB — GLUCOSE, CAPILLARY
Glucose-Capillary: 111 mg/dL — ABNORMAL HIGH (ref 70–99)
Glucose-Capillary: 139 mg/dL — ABNORMAL HIGH (ref 70–99)
Glucose-Capillary: 131 mg/dL — ABNORMAL HIGH (ref 70–99)
Glucose-Capillary: 141 mg/dL — ABNORMAL HIGH (ref 70–99)

## 2024-07-22 LAB — BASIC METABOLIC PANEL WITH GFR
Anion gap: 12 (ref 5–15)
BUN: 16 mg/dL (ref 8–23)
CO2: 23 mmol/L (ref 22–32)
Calcium: 8.5 mg/dL — ABNORMAL LOW (ref 8.9–10.3)
Chloride: 109 mmol/L (ref 98–111)
Creatinine, Ser: 1.53 mg/dL — ABNORMAL HIGH (ref 0.61–1.24)
GFR, Estimated: 48 mL/min — ABNORMAL LOW (ref >60)
Glucose, Bld: 100 mg/dL — ABNORMAL HIGH (ref 70–99)
Potassium: 3.3 mmol/L — ABNORMAL LOW (ref 3.5–5.1)
Sodium: 144 mmol/L (ref 135–145)

## 2024-07-22 LAB — CBC
HCT: 26.4 % — ABNORMAL LOW (ref 39.0–52.0)
Hemoglobin: 8.5 g/dL — ABNORMAL LOW (ref 13.0–17.0)
MCH: 27.7 pg (ref 26.0–34.0)
MCHC: 32.2 g/dL (ref 30.0–36.0)
MCV: 86 fL (ref 80.0–100.0)
Platelets: 216 K/uL (ref 150–400)
RBC: 3.07 MIL/uL — ABNORMAL LOW (ref 4.22–5.81)
RDW: 22.3 % — ABNORMAL HIGH (ref 11.5–15.5)
WBC: 8.4 K/uL (ref 4.0–10.5)
nRBC: 0.2 % (ref 0.0–0.2)

## 2024-07-22 LAB — AEROBIC/ANAEROBIC CULTURE W GRAM STAIN (SURGICAL/DEEP WOUND): Culture: NO GROWTH

## 2024-07-22 MED ORDER — POTASSIUM CHLORIDE CRYS ER 20 MEQ PO TBCR
40.0000 meq | EXTENDED_RELEASE_TABLET | Freq: Two times a day (BID) | ORAL | Status: AC
  Administered 2024-07-22 (×2): 40 meq via ORAL
  Filled 2024-07-22 (×2): qty 2
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
## 2024-07-22 NOTE — Plan of Care (Signed)
  Problem: Activity: Goal: Risk for activity intolerance will decrease Outcome: Progressing   Problem: Nutrition: Goal: Adequate nutrition will be maintained Outcome: Progressing   Problem: Skin Integrity: Goal: Risk for impaired skin integrity will decrease Outcome: Progressing   Problem: Safety: Goal: Ability to remain free from injury will improve Outcome: Progressing   Problem: Clinical Measurements: Goal: Postoperative complications will be avoided or minimized Outcome: Progressing Goal: Signs and symptoms of graft occlusion will improve Outcome: Progressing

## 2024-07-22 NOTE — Assessment & Plan Note (Signed)
 Patient fully alert and oriented, VSS. No L sided deficits on exam. EEG w/ no seizures identified. Most likely encephalopathy w/ L sided weakness due to medication effects from Dilaudid  and febrile vs complex seizure. - Neurology signed off; recommended considering increasing Keppra  dose to 750 mg BID if patient has another episode - CTM

## 2024-07-22 NOTE — TOC Progression Note (Signed)
 Transition of Care [facility]) - Progression Note    Patient Details  Name: Kyle [name] MRN: [id number] Date of Birth: 04-01-1951  Transition [facility] Care [facility]) CM/SW Contact  Isaiah Public, LCSWA Phone Number: 07/22/2024, 1:42 PM  Clinical Narrative:     CSW faxed patient out for SNF.  CSW awaiting SNF bed offers. CSW following to start insurance authorization for SNF.CSW will continue to follow.  Expected Discharge Plan: Skilled Nursing Facility Barriers to Discharge: Continued Medical Work up               Expected Discharge Plan and Services                                               Social Drivers of Health (SDOH) Interventions SDOH Screenings   Food Insecurity: Patient Declined (07/18/2024)  Housing: Low Risk  (07/20/2024)  Transportation Needs: Unmet Transportation Needs (04/18/2024)  Utilities: Not At Risk (07/18/2024)  Alcohol Screen: Low Risk  (03/05/2023)  Depression (PHQ2-9): Low Risk  (06/21/2024)  Financial Resource Strain: Low Risk  (03/05/2023)  Physical Activity: Inactive (04/18/2024)  Social Connections: Unknown (07/20/2024)  Stress: No Stress Concern Present (04/18/2024)  Tobacco Use: Medium Risk (07/17/2024)    Readmission Risk Interventions    03/08/2024    1:17 PM  Readmission Risk Prevention Plan  Transportation Screening   Medication Review (RN Care Manager)   PCP or Specialist appointment within 3-5 days of discharge   HRI or Home Care Consult   SW Recovery Care/Counseling Consult   Palliative Care Screening   Skilled Nursing Facility      Information is confidential and restricted. Go to Review Flowsheets to unlock data.

## 2024-07-22 NOTE — Assessment & Plan Note (Signed)
 History of stage 3b CKD. Cr 1.45 on admission. Cr today stable at 1.65. Baseline appears to be ~1.4. Likely related to limited PO intake post-op, prior antibiotic regimen (vancomycin ), recent contrast.  -AM BMP

## 2024-07-22 NOTE — Assessment & Plan Note (Signed)
 COPD - 4L O2 via [place] at baseline. Home regimen of Trelegy Ellipta  1 puff daily (on Breztri  formulary equivalent); requiring additional O2 HFNC since admission A fib - Rate controlled with carvedilol  6.25 mg twice daily. Hold home Eliquis  5 mg twice daily in the setting of being on heparin  HTN - Continue home carvedilol  6.25 mg BID; Hold home regimen of amlodipine  5 mg daily, hydralazine  25 mg daily while borderline normo-hypertensive HLD - Home regimen of Crestor  10 mg daily; LDL of 53, well controlled T2DM - Last A1c 4 months ago, 5.8. Home regimen of Insulin  5u daily in the AM. Will see how he tolerates PO and monitor CBGs w/ SSI and add Lantus  prn. HFpEF - Last echo 08/2023 with EF of 55-60%. Home regimen of carvedilol  6.25 mg twice daily Hx seizure - Home regimen of Keppra  500 mg twice daily  Anxiety/Depression - Home regimen of mirtazapine  7.5 mg daily  GERD - Home regimen of Protonix  40 mg daily  Neuropathy - Home gabapentin  300 mg AM + 600 mg PM PAD - continue Crestor  10 mg daily; lipids well-controlled; can consider increasing outpatient with PCP
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
## 2024-07-22 NOTE — Assessment & Plan Note (Signed)
 POD 5 s/p right femoral artery pseudoaneurysm repair. Wound VAC present in right groin, continued serosanguinous drainage. Pain well-controlled. Doing well on home 4 L O2. - Vascular surgery following - Antibiotics: IV Vancomycin  (9/22-9/24), IV Zosyn  (9/22- ), IV Linezolid  (9/24-) - Per vascular, planning to continue broad-spectrum antibiotics until final culture results - Switched Vanc to Linezolid  after discussion w/ pharmacy and vasc surg given patient's AKI - Pain: Oxycodone  5 mg q6h for moderate pain, and Tylenol  650 mg Q4H for mild pain - Bowel regimen: Miralax  and Senna daily prn for constipation - Incentive spirometry - Wound culture: NGTD - VAC in place - PT/OT recommending SNF for STR - Labs: AM BMP, CBC
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
## 2024-07-22 NOTE — Assessment & Plan Note (Signed)
 Stable H/H 8.5/26.4 this morning. Hemoptysis improved. S/p 2 units pRBCs. Hgb 8.7 on 07/21/24  - CTM, possible GI consult if does not resolve - Transfuse if Hgb < 7

## 2024-07-22 NOTE — Progress Notes (Addendum)
  Progress Note    07/22/2024 9:00 AM 5 Days Post-Op  Subjective: No complaints    Vitals:   07/22/24 0400 07/22/24 0600  BP: (!) 154/65 (!) 162/65  Pulse: 73 77  Resp: 16 16  Temp:    SpO2:  96%    Physical Exam: General: Resting in bed Cardiac:  regular Lungs: Nonlabored Incisions: Right groin with wound VAC with good seal Extremities:  RLE well-perfused with brisk peroneal doppler signal  CBC    Component Value Date/Time   WBC 8.4 07/22/2024 0330   RBC 3.07 (L) 07/22/2024 0330   HGB 8.5 (L) 07/22/2024 0330   HGB 14.8 02/12/2022 0943   HCT 26.4 (L) 07/22/2024 0330   HCT 45.0 02/12/2022 0943   PLT 216 07/22/2024 0330   PLT 174 02/12/2022 0943   MCV 86.0 07/22/2024 0330   MCV 95 02/12/2022 0943   MCH 27.7 07/22/2024 0330   MCHC 32.2 07/22/2024 0330   RDW 22.3 (H) 07/22/2024 0330   RDW 15.0 02/12/2022 0943   LYMPHSABS 1.4 07/10/2024 1650   LYMPHSABS 1.5 02/12/2022 0943   MONOABS 0.8 07/10/2024 1650   EOSABS 0.2 07/10/2024 1650   EOSABS 0.1 02/12/2022 0943   BASOSABS 0.0 07/10/2024 1650   BASOSABS 0.0 02/12/2022 0943    BMET    Component Value Date/Time   NA 144 07/22/2024 0330   NA 145 (H) 02/03/2023 0927   K 3.3 (L) 07/22/2024 0330   CL 109 07/22/2024 0330   CO2 23 07/22/2024 0330   GLUCOSE 100 (H) 07/22/2024 0330   BUN 16 07/22/2024 0330   BUN 22 02/03/2023 0927   CREATININE 1.53 (H) 07/22/2024 0330   CREATININE 0.99 11/12/2014 1344   CALCIUM  8.5 (L) 07/22/2024 0330   GFRNONAA 48 (L) 07/22/2024 0330   GFRAA 46 (L) 02/23/2020 0421    INR    Component Value Date/Time   INR 1.5 (H) 07/10/2024 1650     Intake/Output Summary (Last 24 hours) at 07/22/2024 0900 Last data filed at 07/22/2024 0509 Gross per 24 hour  Intake 1029.8 ml  Output 810 ml  Net 219.8 ml      Assessment/Plan:  73 y.o. male is 5 days postop, s/p: Repair of right common femoral pseudoaneurysm, right common femoral artery to profunda bypass with 8 mm dacron graft,  revision of right common femoral to peroneal vein graft with new proximal anastomosis to dacron interposition, right lower extremity thrombectomy, sartorius muscle flap right groin for coverage of dacron graft, 15 French drain placement right sartorius muscle bed, and wound VAC right groin wound 07/17/2024 by Dr. Gretta     - Right groin with wound VAC with good seal -Right lower extremity remains well-perfused with brisk peroneal Doppler signal -Will plan for wound VAC change at the bedside on Monday -Continue broad-spectrum antibiotics.  No growth on cultures.  We can stop antibiotics if nothing grows on final cultures   Ahmed Holster, PA-C [facility] [phone] 07/22/2024 9:00 AM  I have seen and evaluated the patient. I agree with the PA note as documented above.  Right groin VAC with good seal.  Will plan VAC change on Monday.  No growth on cultures hopefully can stop antibiotics when this is final.  Brisk Doppler signal in the foot.  Overall progressing well.  Lonni DOROTHA Gretta, MD [facility] [place] Office: [phone]

## 2024-07-23 LAB — GLUCOSE, CAPILLARY
Glucose-Capillary: 109 mg/dL — ABNORMAL HIGH (ref 70–99)
Glucose-Capillary: 136 mg/dL — ABNORMAL HIGH (ref 70–99)
Glucose-Capillary: 107 mg/dL — ABNORMAL HIGH (ref 70–99)
Glucose-Capillary: 104 mg/dL — ABNORMAL HIGH (ref 70–99)

## 2024-07-23 LAB — CBC
HCT: 27 % — ABNORMAL LOW (ref 39.0–52.0)
Hemoglobin: 8.8 g/dL — ABNORMAL LOW (ref 13.0–17.0)
MCH: 27.8 pg (ref 26.0–34.0)
MCHC: 32.6 g/dL (ref 30.0–36.0)
MCV: 85.4 fL (ref 80.0–100.0)
Platelets: 237 K/uL (ref 150–400)
RBC: 3.16 MIL/uL — ABNORMAL LOW (ref 4.22–5.81)
RDW: 22 % — ABNORMAL HIGH (ref 11.5–15.5)
WBC: 9.7 K/uL (ref 4.0–10.5)
nRBC: 0.4 % — ABNORMAL HIGH (ref 0.0–0.2)

## 2024-07-23 LAB — BASIC METABOLIC PANEL WITH GFR
Anion gap: 9 (ref 5–15)
BUN: 12 mg/dL (ref 8–23)
CO2: 23 mmol/L (ref 22–32)
Calcium: 8.6 mg/dL — ABNORMAL LOW (ref 8.9–10.3)
Chloride: 112 mmol/L — ABNORMAL HIGH (ref 98–111)
Creatinine, Ser: 1.36 mg/dL — ABNORMAL HIGH (ref 0.61–1.24)
GFR, Estimated: 55 mL/min — ABNORMAL LOW (ref >60)
Glucose, Bld: 108 mg/dL — ABNORMAL HIGH (ref 70–99)
Potassium: 4.2 mmol/L (ref 3.5–5.1)
Sodium: 144 mmol/L (ref 135–145)
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
## 2024-07-23 NOTE — Assessment & Plan Note (Signed)
 COPD - 4L O2 via [place] at baseline. Home regimen of Trelegy Ellipta  1 puff daily (on Breztri  formulary equivalent); requiring additional O2 HFNC since admission A fib - Rate controlled with carvedilol  6.25 mg twice daily. Hold home Eliquis  5 mg twice daily in the setting of being on heparin  HTN - Continue home carvedilol  6.25 mg BID; Hold home regimen of amlodipine  5 mg daily, hydralazine  25 mg daily while borderline normo-hypertensive HLD - Home regimen of Crestor  10 mg daily; LDL of 53, well controlled T2DM - Last A1c 4 months ago, 5.8. Home regimen of Insulin  5u daily in the AM. Will see how he tolerates PO and monitor CBGs w/ SSI and add Lantus  prn. HFpEF - Last echo 08/2023 with EF of 55-60%. Home regimen of carvedilol  6.25 mg twice daily Hx seizure - Home regimen of Keppra  500 mg twice daily  Anxiety/Depression - Home regimen of mirtazapine  7.5 mg daily  GERD - Home regimen of Protonix  40 mg daily  Neuropathy - Home gabapentin  300 mg AM + 600 mg PM PAD - continue Crestor  10 mg daily; lipids well-controlled; can consider increasing outpatient with PCP
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
## 2024-07-23 NOTE — Assessment & Plan Note (Addendum)
 POD6 s/p right femoral artery pseudoaneurysm repair. - Vasc surg consulted, appreciate recs - Wound culture NGTD - Antibiotics: IV Vancomycin  (9/22-9/24), IV Zosyn  (9/22- ), IV Linezolid  (9/24-) - Per vascular, planning to continue broad-spectrum antibiotics until final culture results - Switched Vanc to Linezolid  after discussion w/ pharmacy and vasc surg given patient's AKI - Pain: Oxycodone  5 mg q6h for moderate pain, and Tylenol  650 mg Q4H for mild pain - Bowel regimen: Miralax  and Senna daily prn for constipation - Incentive spirometry - VAC in place - PT/OT recommending SNF for STR - Labs: AM BMP, CBC

## 2024-07-23 NOTE — Plan of Care (Signed)
  Problem: Education: Goal: Knowledge of General Education information will improve Description: Including pain rating scale, medication(s)/side effects and non-pharmacologic comfort measures Outcome: Progressing   Problem: Health Behavior/Discharge Planning: Goal: Ability to manage health-related needs will improve Outcome: Progressing   Problem: Clinical Measurements: Goal: Ability to maintain clinical measurements within normal limits will improve Outcome: Progressing Goal: Will remain free from infection Outcome: Progressing Goal: Diagnostic test results will improve Outcome: Progressing Goal: Respiratory complications will improve Outcome: Progressing Goal: Cardiovascular complication will be avoided Outcome: Progressing   Problem: Activity: Goal: Risk for activity intolerance will decrease Outcome: Progressing   Problem: Nutrition: Goal: Adequate nutrition will be maintained Outcome: Progressing   Problem: Coping: Goal: Level of anxiety will decrease Outcome: Progressing   Problem: Elimination: Goal: Will not experience complications related to bowel motility Outcome: Progressing Goal: Will not experience complications related to urinary retention Outcome: Progressing   Problem: Pain Managment: Goal: General experience of comfort will improve and/or be controlled Outcome: Progressing   Problem: Safety: Goal: Ability to remain free from injury will improve Outcome: Progressing   Problem: Skin Integrity: Goal: Risk for impaired skin integrity will decrease Outcome: Progressing   Problem: Education: Goal: Knowledge of prescribed regimen will improve Outcome: Progressing   Problem: Activity: Goal: Ability to tolerate increased activity will improve Outcome: Progressing   Problem: Bowel/Gastric: Goal: Gastrointestinal status for postoperative course will improve Outcome: Progressing   Problem: Clinical Measurements: Goal: Postoperative complications  will be avoided or minimized Outcome: Progressing Goal: Signs and symptoms of graft occlusion will improve Outcome: Progressing   Problem: Skin Integrity: Goal: Demonstration of wound healing without infection will improve Outcome: Progressing   Problem: Education: Goal: Ability to describe self-care measures that may prevent or decrease complications (Diabetes Survival Skills Education) will improve Outcome: Progressing Goal: Individualized Educational Video(s) Outcome: Progressing   Problem: Coping: Goal: Ability to adjust to condition or change in health will improve Outcome: Progressing   Problem: Fluid Volume: Goal: Ability to maintain a balanced intake and output will improve Outcome: Progressing   Problem: Health Behavior/Discharge Planning: Goal: Ability to identify and utilize available resources and services will improve Outcome: Progressing Goal: Ability to manage health-related needs will improve Outcome: Progressing   Problem: Metabolic: Goal: Ability to maintain appropriate glucose levels will improve Outcome: Progressing   Problem: Nutritional: Goal: Maintenance of adequate nutrition will improve Outcome: Progressing Goal: Progress toward achieving an optimal weight will improve Outcome: Progressing   Problem: Skin Integrity: Goal: Risk for impaired skin integrity will decrease Outcome: Progressing   Problem: Tissue Perfusion: Goal: Adequacy of tissue perfusion will improve Outcome: Progressing

## 2024-07-23 NOTE — Progress Notes (Addendum)
 Progress Note    07/23/2024 9:02 AM 6 Days Post-Op  Subjective: No complaints    Vitals:   07/23/24 0725 07/23/24 0800  BP: (!) 185/68 (!) 153/69  Pulse: 90 91  Resp: 18 18  Temp: 98.5 F (36.9 C)   SpO2: 95% 96%    Physical Exam: General: Sitting up in bed, alert and oriented x 3 Cardiac: Regular Lungs: Nonlabored Incisions: Right groin with wound VAC with good seal Extremities: Right lower extremity well-perfused with brisk peroneal doppler signal  CBC    Component Value Date/Time   WBC 9.7 07/23/2024 0246   RBC 3.16 (L) 07/23/2024 0246   HGB 8.8 (L) 07/23/2024 0246   HGB 14.8 02/12/2022 0943   HCT 27.0 (L) 07/23/2024 0246   HCT 45.0 02/12/2022 0943   PLT 237 07/23/2024 0246   PLT 174 02/12/2022 0943   MCV 85.4 07/23/2024 0246   MCV 95 02/12/2022 0943   MCH 27.8 07/23/2024 0246   MCHC 32.6 07/23/2024 0246   RDW 22.0 (H) 07/23/2024 0246   RDW 15.0 02/12/2022 0943   LYMPHSABS 1.4 07/10/2024 1650   LYMPHSABS 1.5 02/12/2022 0943   MONOABS 0.8 07/10/2024 1650   EOSABS 0.2 07/10/2024 1650   EOSABS 0.1 02/12/2022 0943   BASOSABS 0.0 07/10/2024 1650   BASOSABS 0.0 02/12/2022 0943    BMET    Component Value Date/Time   NA 144 07/23/2024 0246   NA 145 (H) 02/03/2023 0927   K 4.2 07/23/2024 0246   CL 112 (H) 07/23/2024 0246   CO2 23 07/23/2024 0246   GLUCOSE 108 (H) 07/23/2024 0246   BUN 12 07/23/2024 0246   BUN 22 02/03/2023 0927   CREATININE 1.36 (H) 07/23/2024 0246   CREATININE 0.99 11/12/2014 1344   CALCIUM  8.6 (L) 07/23/2024 0246   GFRNONAA 55 (L) 07/23/2024 0246   GFRAA 46 (L) 02/23/2020 0421    INR    Component Value Date/Time   INR 1.5 (H) 07/10/2024 1650     Intake/Output Summary (Last 24 hours) at 07/23/2024 0902 Last data filed at 07/23/2024 0543 Gross per 24 hour  Intake 892.85 ml  Output 850 ml  Net 42.85 ml      Assessment/Plan:  73 y.o. male is 6 days post op, s/p: Repair of right common femoral pseudoaneurysm, right  common femoral artery to profunda bypass with 8 mm dacron graft, revision of right common femoral to peroneal vein graft with new proximal anastomosis to dacron interposition, right lower extremity thrombectomy, sartorius muscle flap right groin for coverage of dacron graft, 15 French drain placement right sartorius muscle bed, and wound VAC right groin wound 07/17/2024 by Dr. Gretta      -He is doing well this morning without any complaints -Right groin with wound VAC with good seal -Right lower extremity is well-perfused with brisk peroneal Doppler signal -No growth on final culture results.  We can discontinue antibiotics at this time -[facility] for [name] change of the right groin at the bedside tomorrow   Kyle Holster, PA-C [facility] [phone] 07/23/2024 9:02 AM  I have seen and evaluated the patient. I agree with the PA note as documented above.  Final culture is negative and no evidence that this was a mycotic pseudoaneurysm.  Antibiotics can be stopped.  Brisk Doppler signal on the right foot.  Plan VAC change at bedside tomorrow 1 week status post muscle flap for coverage of bypass after pseudoaneurysm repair.  Hopefully home VAC and discharge after VAC change  tomorrow.  Kyle DOROTHA Gaskins, MD [facility] [place] Office: [phone]

## 2024-07-23 NOTE — Plan of Care (Signed)
  Problem: Nutrition: Goal: Adequate nutrition will be maintained Outcome: Progressing   Problem: Elimination: Goal: Will not experience complications related to bowel motility Outcome: Progressing Goal: Will not experience complications related to urinary retention Outcome: Progressing   Problem: Pain Managment: Goal: General experience of comfort will improve and/or be controlled Outcome: Progressing   Problem: Skin Integrity: Goal: Risk for impaired skin integrity will decrease Outcome: Progressing   Problem: Activity: Goal: Ability to tolerate increased activity will improve Outcome: Progressing

## 2024-07-23 NOTE — Assessment & Plan Note (Addendum)
 Hgb stable. - AM CBC - Transfuse <7 - s/p 2u pRBC on 9/22

## 2024-07-23 NOTE — Assessment & Plan Note (Addendum)
 Cr at baseline today (~1.4) - AM BMP

## 2024-07-24 LAB — CBC
HCT: 27.2 % — ABNORMAL LOW (ref 39.0–52.0)
Hemoglobin: 8.6 g/dL — ABNORMAL LOW (ref 13.0–17.0)
MCH: 27.6 pg (ref 26.0–34.0)
MCHC: 31.6 g/dL (ref 30.0–36.0)
MCV: 87.2 fL (ref 80.0–100.0)
Platelets: 223 K/uL (ref 150–400)
RBC: 3.12 MIL/uL — ABNORMAL LOW (ref 4.22–5.81)
RDW: 22.1 % — ABNORMAL HIGH (ref 11.5–15.5)
WBC: 9 K/uL (ref 4.0–10.5)
nRBC: 0.3 % — ABNORMAL HIGH (ref 0.0–0.2)

## 2024-07-24 LAB — BASIC METABOLIC PANEL WITH GFR
Anion gap: 7 (ref 5–15)
BUN: 11 mg/dL (ref 8–23)
CO2: 22 mmol/L (ref 22–32)
Calcium: 8.5 mg/dL — ABNORMAL LOW (ref 8.9–10.3)
Chloride: 113 mmol/L — ABNORMAL HIGH (ref 98–111)
Creatinine, Ser: 1.56 mg/dL — ABNORMAL HIGH (ref 0.61–1.24)
GFR, Estimated: 47 mL/min — ABNORMAL LOW (ref >60)
Glucose, Bld: 87 mg/dL (ref 70–99)
Potassium: 3.8 mmol/L (ref 3.5–5.1)
Sodium: 142 mmol/L (ref 135–145)

## 2024-07-24 LAB — GLUCOSE, CAPILLARY
Glucose-Capillary: 88 mg/dL (ref 70–99)
Glucose-Capillary: 84 mg/dL (ref 70–99)
Glucose-Capillary: 112 mg/dL — ABNORMAL HIGH (ref 70–99)
Glucose-Capillary: 85 mg/dL (ref 70–99)

## 2024-07-24 MED ORDER — APIXABAN 5 MG PO TABS
5.0000 mg | ORAL_TABLET | Freq: Two times a day (BID) | ORAL | Status: DC
  Administered 2024-07-24 – 2024-07-25 (×3): 5 mg via ORAL
  Filled 2024-07-24 (×3): qty 1

## 2024-07-24 MED ORDER — CARVEDILOL 12.5 MG PO TABS
12.5000 mg | ORAL_TABLET | Freq: Two times a day (BID) | ORAL | Status: DC
  Administered 2024-07-24 – 2024-07-25 (×3): 12.5 mg via ORAL
  Filled 2024-07-24 (×3): qty 1

## 2024-07-24 NOTE — TOC Progression Note (Signed)
 Transition of Care [facility]) - Progression Note    Patient Details  Name: Kyle [name] MRN: [id number] Date of Birth: 19-Jul-1951  Transition of [facility]) CM/SW Contact  Montie LOISE Louder, [name] Phone Number: 07/24/2024, 10:42 AM  Clinical Narrative:     Per chart review patient was given bed offers. He requested to discuss with his spouse before making a choice. CSW spoke with patient's spouse(by phone) she states she wants the patient to return home. She reports he wants the patient to come home and continue services w/[facility]. She states she is on the way to the hospital. CSW advised will talk with her once she arrives to confirm d/c plan.   Montie Louder, MSW, LCSW Clinical Social Worker    Expected Discharge Plan: Skilled Nursing Facility Barriers to Discharge: Continued Medical Work up               Expected Discharge Plan and Services                                               Social Drivers of Health (SDOH) Interventions SDOH Screenings   Food Insecurity: Patient Declined (07/18/2024)  Housing: Low Risk  (07/20/2024)  Transportation Needs: Unmet Transportation Needs (04/18/2024)  Utilities: Not At Risk (07/18/2024)  Alcohol Screen: Low Risk  (03/05/2023)  Depression (PHQ2-9): Low Risk  (06/21/2024)  Financial Resource Strain: Low Risk  (03/05/2023)  Physical Activity: Inactive (04/18/2024)  Social Connections: Unknown (07/20/2024)  Stress: No Stress Concern Present (04/18/2024)  Tobacco Use: Medium Risk (07/17/2024)    Readmission Risk Interventions    03/08/2024    1:17 PM  Readmission Risk Prevention Plan  Transportation Screening   Medication Review (RN Care Manager)   PCP or Specialist appointment within 3-5 days of discharge   HRI or Home Care Consult   SW Recovery Care/Counseling Consult   Palliative Care Screening   Skilled Nursing Facility      Information is confidential and restricted. Go to Review Flowsheets to unlock data.

## 2024-07-24 NOTE — Progress Notes (Addendum)
  Progress Note    07/24/2024 7:55 AM 7 Days Post-Op  Subjective:  no complaints   Vitals:   07/24/24 0400 07/24/24 0600  BP: (!) 154/65 (!) 140/69  Pulse: 86 100  Resp: 18 20  Temp: 98.6 F (37 C)   SpO2: 91%    Physical Exam: Lungs:  non labored Incisions:  R groin vac removed; viable muscle flap Extremities:  brisk R ATA by doppler Neurologic: A&O    CBC    Component Value Date/Time   WBC 9.0 07/24/2024 0341   RBC 3.12 (L) 07/24/2024 0341   HGB 8.6 (L) 07/24/2024 0341   HGB 14.8 02/12/2022 0943   HCT 27.2 (L) 07/24/2024 0341   HCT 45.0 02/12/2022 0943   PLT 223 07/24/2024 0341   PLT 174 02/12/2022 0943   MCV 87.2 07/24/2024 0341   MCV 95 02/12/2022 0943   MCH 27.6 07/24/2024 0341   MCHC 31.6 07/24/2024 0341   RDW 22.1 (H) 07/24/2024 0341   RDW 15.0 02/12/2022 0943   LYMPHSABS 1.4 07/10/2024 1650   LYMPHSABS 1.5 02/12/2022 0943   MONOABS 0.8 07/10/2024 1650   EOSABS 0.2 07/10/2024 1650   EOSABS 0.1 02/12/2022 0943   BASOSABS 0.0 07/10/2024 1650   BASOSABS 0.0 02/12/2022 0943    BMET    Component Value Date/Time   NA 142 07/24/2024 0341   NA 145 (H) 02/03/2023 0927   K 3.8 07/24/2024 0341   CL 113 (H) 07/24/2024 0341   CO2 22 07/24/2024 0341   GLUCOSE 87 07/24/2024 0341   BUN 11 07/24/2024 0341   BUN 22 02/03/2023 0927   CREATININE 1.56 (H) 07/24/2024 0341   CREATININE 0.99 11/12/2014 1344   CALCIUM  8.5 (L) 07/24/2024 0341   GFRNONAA 47 (L) 07/24/2024 0341   GFRAA 46 (L) 02/23/2020 0421    INR    Component Value Date/Time   INR 1.5 (H) 07/10/2024 1650     Intake/Output Summary (Last 24 hours) at 07/24/2024 0755 Last data filed at 07/23/2024 1724 Gross per 24 hour  Intake 480 ml  Output 50 ml  Net 430 ml     Assessment/Plan:  73 y.o. male is s/p Repair of right common femoral pseudoaneurysm, right common femoral artery to profunda bypass with 8 mm dacron graft, revision of right common femoral to peroneal vein graft with new  proximal anastomosis to dacron interposition, right lower extremity thrombectomy, sartorius muscle flap right groin for coverage of dacron graft, 15 French drain placement right sartorius muscle bed, and wound VAC right groin wound  7 Days Post-Op   R leg well perfused with brisk ATA by doppler Wound vac changed at the bedside; muscle flap appears viable; continue vac changes 2x/week.  OK for d/c to SNF when approved.  OOB with therapy   Donnice Sender, PA-C [facility] [phone] 07/24/2024 7:55 AM  I have seen and evaluated the patient. I agree with the PA note as documented above.  Right groin VAC changed today at bedside and muscle flap looks good.  Can DC antibiotics.  Brisk Doppler signal in the right foot.  Okay to discharge to SNF with close interval surveillance.  Will need VAC changes in the facility.  Lonni DOROTHA Gaskins, MD [facility] [place] Office: [phone]

## 2024-07-24 NOTE — Assessment & Plan Note (Signed)
 Hgb stable. - AM CBC - Transfuse <7 - s/p 2u pRBC on 9/22

## 2024-07-24 NOTE — TOC Progression Note (Signed)
 Transition of Care [facility]) - Progression Note    Patient Details  Name: Kyle [name] MRN: [id number] Date of Birth: 08/07/1951  [facility] CM/SW Contact  Kyle [name], [name] Phone Number: 07/24/2024, 1:25 PM  Clinical Narrative:     CSW  and CM met with patient and his spouse at bedside to discuss and confirm disposition plan. Patient's spouse initial thought was for the patient to return home with [facility] services. However, she inquired about his mobility and  realized  based on PT documentation that he  would require more physical help than she is able to provide, they both now agreeable SNF. They both were agreeable to [facility].   [facility] confirmed bed availability. [facility] states they can admit patient tomorrow, once they have received the wound Vac.   Insurance authorization for SNF is approved # reference # [id number] shara ID [id number] 9/29-10/1  Kyle [name], MSW, LCSW Clinical Social Worker      Expected Discharge Plan: Skilled Nursing Facility Barriers to Discharge: Continued Medical Work up               Expected Discharge Plan and Services                                               Social Drivers of Health (SDOH) Interventions SDOH Screenings   Food Insecurity: Patient Declined (07/18/2024)  Housing: Low Risk  (07/20/2024)  Transportation Needs: Unmet Transportation Needs (04/18/2024)  Utilities: Not At Risk (07/18/2024)  Alcohol Screen: Low Risk  (03/05/2023)  Depression (PHQ2-9): Low Risk  (06/21/2024)  Financial Resource Strain: Low Risk  (03/05/2023)  Physical Activity: Inactive (04/18/2024)  Social Connections: Unknown (07/20/2024)  Stress: No Stress Concern Present (04/18/2024)  Tobacco Use: Medium Risk (07/17/2024)    Readmission Risk Interventions    03/08/2024    1:17 PM  Readmission Risk Prevention Plan  Transportation Screening   Medication Review (RN Care Manager)   PCP or Specialist appointment within  3-5 days of discharge   HRI or Home Care Consult   SW Recovery Care/Counseling Consult   Palliative Care Screening   Skilled Nursing Facility      Information is confidential and restricted. Go to Review Flowsheets to unlock data.
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
## 2024-07-24 NOTE — Assessment & Plan Note (Addendum)
 COPD - 4L O2 via [place] at baseline. Home regimen of Trelegy Ellipta  1 puff daily (on Breztri  formulary equivalent); requiring additional O2 HFNC since admission A fib - Rate controlled with carvedilol  12.5 BID. Home Eliquis  5 mg twice daily HTN - Continue home carvedilol  12.5 mg BID, amlodipine  5 mg daily; hold hydralazine  25 mg daily HLD - Home regimen of Crestor  10 mg daily; LDL of 53, well controlled T2DM - Last A1c 4 months ago, 5.8.  HFpEF - Last echo 08/2023 with EF of 55-60%. Home regimen of carvedilol  6.25 mg twice daily Hx seizure - Home regimen of Keppra  500 mg twice daily  Anxiety/Depression - Home regimen of mirtazapine  7.5 mg daily  GERD - Home regimen of Protonix  40 mg daily  Neuropathy - Home gabapentin  300 mg AM + 600 mg PM PAD - continue Crestor  10 mg daily; lipids well-controlled; can consider increasing outpatient with PCP

## 2024-07-24 NOTE — Discharge Instructions (Addendum)
 Dear Kyle [name],  Thank you for letting us  participate in your care. You were hospitalized following Pseudoaneurysm of femoral artery repair. You were treated with a course of antibiotics to prevent infection after this procedure.   You also had an episode of left-sided weakness, which we investigated and found there was no evidence for stroke. This was thought to be due to a post-procedure fever or a medication side effect, and no further changes were needed.  You also had an increased oxygen  requirement for the first few days after the procedure, but we were able to get you back to your home regimen.  POST-HOSPITAL & CARE INSTRUCTIONS Follow-up with vascular surgery Please call to arrange follow up with your primary care physician in 1 week.  Go to your follow up appointments (listed below)   DOCTOR'S APPOINTMENT   Future Appointments  Date Time Provider Department Center  09/11/2024 11:30 AM Jude Harden GAILS, MD [facility] [phone] [place]  09/18/2024 11:50 AM [facility] ANNUAL WELLNESS VISIT [facility]  11/15/2024  7:45 AM Whitfield Raisin, NP [facility] None     Take care and be well!  Family Medicine Teaching Service Inpatient Team [place]  [facility]  [address] [place], [name] [address] [phone]
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
## 2024-07-24 NOTE — Assessment & Plan Note (Addendum)
 Cr elevated at 1.56. Baseline Cr ~1.4 - Encourage p.o. intake

## 2024-07-24 NOTE — Assessment & Plan Note (Addendum)
 POD7 s/p right femoral artery pseudoaneurysm repair. Per vascular, patient stable for discharge to SNF. - Vasc surg consulted, appreciate recs - Pain: Oxycodone  5 mg q6h for moderate pain, and Tylenol  650 mg Q4H for mild pain - Bowel regimen: Miralax  and Senna daily prn for constipation - Incentive spirometry - VAC in place (changed today) - PT/OT recommending SNF for STR - Final wound culture w/ no growth - Antibiotics completed: IV Vancomycin  (9/22-9/24), IV Zosyn  (9/22-9/28), IV Linezolid  (9/24-9/28) - Discontinued heparin  and restarted home Eliquis  5 mg BID

## 2024-07-25 DIAGNOSIS — R5381 Other malaise: Secondary | ICD-10-CM | POA: Diagnosis not present

## 2024-07-25 DIAGNOSIS — J449 Chronic obstructive pulmonary disease, unspecified: Secondary | ICD-10-CM | POA: Diagnosis not present

## 2024-07-25 DIAGNOSIS — I724 Aneurysm of artery of lower extremity: Secondary | ICD-10-CM | POA: Diagnosis not present

## 2024-07-25 DIAGNOSIS — F419 Anxiety disorder, unspecified: Secondary | ICD-10-CM | POA: Diagnosis not present

## 2024-07-25 DIAGNOSIS — G319 Degenerative disease of nervous system, unspecified: Secondary | ICD-10-CM | POA: Diagnosis not present

## 2024-07-25 DIAGNOSIS — G8194 Hemiplegia, unspecified affecting left nondominant side: Secondary | ICD-10-CM | POA: Diagnosis not present

## 2024-07-25 DIAGNOSIS — E1140 Type 2 diabetes mellitus with diabetic neuropathy, unspecified: Secondary | ICD-10-CM | POA: Diagnosis not present

## 2024-07-25 DIAGNOSIS — E118 Type 2 diabetes mellitus with unspecified complications: Secondary | ICD-10-CM | POA: Diagnosis not present

## 2024-07-25 DIAGNOSIS — Z7401 Bed confinement status: Secondary | ICD-10-CM | POA: Diagnosis not present

## 2024-07-25 DIAGNOSIS — K219 Gastro-esophageal reflux disease without esophagitis: Secondary | ICD-10-CM | POA: Diagnosis not present

## 2024-07-25 DIAGNOSIS — G40909 Epilepsy, unspecified, not intractable, without status epilepticus: Secondary | ICD-10-CM | POA: Diagnosis not present

## 2024-07-25 DIAGNOSIS — J9601 Acute respiratory failure with hypoxia: Secondary | ICD-10-CM | POA: Diagnosis not present

## 2024-07-25 DIAGNOSIS — E1151 Type 2 diabetes mellitus with diabetic peripheral angiopathy without gangrene: Secondary | ICD-10-CM | POA: Diagnosis not present

## 2024-07-25 DIAGNOSIS — I739 Peripheral vascular disease, unspecified: Secondary | ICD-10-CM | POA: Diagnosis not present

## 2024-07-25 DIAGNOSIS — E46 Unspecified protein-calorie malnutrition: Secondary | ICD-10-CM | POA: Diagnosis not present

## 2024-07-25 DIAGNOSIS — I10 Essential (primary) hypertension: Secondary | ICD-10-CM | POA: Diagnosis not present

## 2024-07-25 DIAGNOSIS — R531 Weakness: Secondary | ICD-10-CM | POA: Diagnosis not present

## 2024-07-25 DIAGNOSIS — Z48812 Encounter for surgical aftercare following surgery on the circulatory system: Secondary | ICD-10-CM | POA: Diagnosis not present

## 2024-07-25 DIAGNOSIS — Z9981 Dependence on supplemental oxygen: Secondary | ICD-10-CM | POA: Diagnosis not present

## 2024-07-25 LAB — BASIC METABOLIC PANEL WITH GFR
Anion gap: 15 (ref 5–15)
BUN: 13 mg/dL (ref 8–23)
CO2: 20 mmol/L — ABNORMAL LOW (ref 22–32)
Calcium: 8.7 mg/dL — ABNORMAL LOW (ref 8.9–10.3)
Chloride: 110 mmol/L (ref 98–111)
Creatinine, Ser: 1.35 mg/dL — ABNORMAL HIGH (ref 0.61–1.24)
GFR, Estimated: 56 mL/min — ABNORMAL LOW (ref >60)
Glucose, Bld: 80 mg/dL (ref 70–99)
Potassium: 4 mmol/L (ref 3.5–5.1)
Sodium: 145 mmol/L (ref 135–145)

## 2024-07-25 LAB — CBC
HCT: 28.8 % — ABNORMAL LOW (ref 39.0–52.0)
Hemoglobin: 9 g/dL — ABNORMAL LOW (ref 13.0–17.0)
MCH: 27.4 pg (ref 26.0–34.0)
MCHC: 31.3 g/dL (ref 30.0–36.0)
MCV: 87.8 fL (ref 80.0–100.0)
Platelets: 234 K/uL (ref 150–400)
RBC: 3.28 MIL/uL — ABNORMAL LOW (ref 4.22–5.81)
RDW: 21.8 % — ABNORMAL HIGH (ref 11.5–15.5)
WBC: 10.2 K/uL (ref 4.0–10.5)
nRBC: 0.5 % — ABNORMAL HIGH (ref 0.0–0.2)

## 2024-07-25 LAB — GLUCOSE, CAPILLARY
Glucose-Capillary: 78 mg/dL (ref 70–99)
Glucose-Capillary: 193 mg/dL — ABNORMAL HIGH (ref 70–99)
Glucose-Capillary: 130 mg/dL — ABNORMAL HIGH (ref 70–99)

## 2024-07-25 MED ORDER — CARVEDILOL 12.5 MG PO TABS: 12.5000 mg | ORAL_TABLET | Freq: Two times a day (BID) | ORAL | Status: AC

## 2024-07-25 MED ORDER — ENSURE PLUS HIGH PROTEIN PO LIQD
237.0000 mL | Freq: Two times a day (BID) | ORAL | Status: DC
  Administered 2024-07-25 (×2): 237 mL via ORAL

## 2024-07-25 NOTE — TOC Transition Note (Signed)
 Transition of Care [facility]) - Discharge Note   Patient Details  Name: Kyle [name] MRN: [id number] Date of Birth: Apr 19, 1951  [facility]) CM/SW Contact:  Montie LOISE Louder, LCSW Phone Number: 07/25/2024, 4:20 PM   Clinical Narrative:     CSW received message : SNF has wound vac and is ready to admit patient.   Patient will Discharge to: [facility] Discharge Date:07/25/24 Family Notified: spouse Transport Ab:[facility]  Per MD patient is ready for discharge. RN, patient, and facility notified of discharge. Discharge Summary sent to facility. RN given number for [phone]. Ambulance transport requested for patient.   Clinical Social Worker signing off.  Montie Louder, MSW, LCSW Clinical Social Worker     Final next level of care: Skilled Nursing Facility Barriers to Discharge: Barriers Resolved   Patient Goals and CMS Choice            Discharge Placement              Patient chooses bed at:  [facility]) Patient to be transferred to facility by: [facility] Name of family member notified: spouse Patient and family notified of of transfer: 07/25/24  Discharge Plan and Services Additional resources added to the After Visit Summary for                                       Social Drivers of Health (SDOH) Interventions SDOH Screenings   Food Insecurity: Patient Declined (07/18/2024)  Housing: Low Risk  (07/20/2024)  Transportation Needs: Unmet Transportation Needs (04/18/2024)  Utilities: Not At Risk (07/18/2024)  Alcohol Screen: Low Risk  (03/05/2023)  Depression (PHQ2-9): Low Risk  (06/21/2024)  Financial Resource Strain: Low Risk  (03/05/2023)  Physical Activity: Inactive (04/18/2024)  Social Connections: Unknown (07/20/2024)  Stress: No Stress Concern Present (04/18/2024)  Tobacco Use: Medium Risk (07/17/2024)     Readmission Risk Interventions    03/08/2024    1:17 PM  Readmission Risk Prevention Plan  Transportation Screening    Medication Review (RN Care Manager)   PCP or Specialist appointment within 3-5 days of discharge   HRI or Home Care Consult   SW Recovery Care/Counseling Consult   Palliative Care Screening   Skilled Nursing Facility      Information is confidential and restricted. Go to Review Flowsheets to unlock data.

## 2024-07-25 NOTE — Assessment & Plan Note (Signed)
 Hgb stable. - AM CBC - Transfuse <7 - s/p 2u pRBC on 9/22

## 2024-07-25 NOTE — Progress Notes (Signed)
  Progress Note    07/25/2024 8:22 AM 8 Days Post-Op  Subjective: No complaints    Vitals:   07/25/24 0400 07/25/24 0748  BP: 134/72 (!) 151/67  Pulse: 83 86  Resp: 19 15  Temp: 98.8 F (37.1 C) 98.9 F (37.2 C)  SpO2: 94% 95%    Physical Exam: General: Resting comfortably Cardiac: Regular Lungs: Nonlabored Incisions: Right groin with wound VAC with good seal Extremities: RLE well-perfused with brisk peroneal Doppler signal   CBC    Component Value Date/Time   WBC 10.2 07/25/2024 0322   RBC 3.28 (L) 07/25/2024 0322   HGB 9.0 (L) 07/25/2024 0322   HGB 14.8 02/12/2022 0943   HCT 28.8 (L) 07/25/2024 0322   HCT 45.0 02/12/2022 0943   PLT 234 07/25/2024 0322   PLT 174 02/12/2022 0943   MCV 87.8 07/25/2024 0322   MCV 95 02/12/2022 0943   MCH 27.4 07/25/2024 0322   MCHC 31.3 07/25/2024 0322   RDW 21.8 (H) 07/25/2024 0322   RDW 15.0 02/12/2022 0943   LYMPHSABS 1.4 07/10/2024 1650   LYMPHSABS 1.5 02/12/2022 0943   MONOABS 0.8 07/10/2024 1650   EOSABS 0.2 07/10/2024 1650   EOSABS 0.1 02/12/2022 0943   BASOSABS 0.0 07/10/2024 1650   BASOSABS 0.0 02/12/2022 0943    BMET    Component Value Date/Time   NA 145 07/25/2024 0322   NA 145 (H) 02/03/2023 0927   K 4.0 07/25/2024 0322   CL 110 07/25/2024 0322   CO2 20 (L) 07/25/2024 0322   GLUCOSE 80 07/25/2024 0322   BUN 13 07/25/2024 0322   BUN 22 02/03/2023 0927   CREATININE 1.35 (H) 07/25/2024 0322   CREATININE 0.99 11/12/2014 1344   CALCIUM  8.7 (L) 07/25/2024 0322   GFRNONAA 56 (L) 07/25/2024 0322   GFRAA 46 (L) 02/23/2020 0421    INR    Component Value Date/Time   INR 1.5 (H) 07/10/2024 1650     Intake/Output Summary (Last 24 hours) at 07/25/2024 9177 Last data filed at 07/25/2024 0645 Gross per 24 hour  Intake 320 ml  Output 1750 ml  Net -1430 ml      Assessment/Plan:  73 y.o. male is 8 days postop, s/p: Repair of right common femoral pseudoaneurysm, right common femoral artery to profunda  bypass with 8 mm dacron graft, revision of right common femoral to peroneal vein graft with new proximal anastomosis to dacron interposition, right lower extremity thrombectomy, sartorius muscle flap right groin for coverage of dacron graft, 15 French drain placement right sartorius muscle bed, and wound VAC right groin wound    - His right lower extremity remains warm and well-perfused with a brisk peroneal Doppler signal -Right groin with wound VAC with good seal.  This was changed at the bedside yesterday -Okay for discharge to SNF from vascular perspective.  He will need VAC changes 2 times weekly at his facility.  Will arrange follow-up with our office in 3 to 4 weeks for wound check   Ahmed Holster, PA-C [facility] [phone] 07/25/2024 8:22 AM

## 2024-07-25 NOTE — Progress Notes (Signed)
 Physical Therapy Treatment Patient Details Name: Kyle [name] MRN: [id number] DOB: May 23, 1951 Today's Date: 07/25/2024   History of Present Illness Pt is a 73 yo male admitted for repair of R com fem pseudoaneurysm wit hR CFA to profunda bypass and revsion of R DVA to peroneal vein graft, R LLE thrombectomy and sartorius ms. flap with placement of R wound vac. 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    PT Comments  Pt received in supine after RN/NT provided hygiene assist and bed bed, pt alert and agreeable to therapy session. Good participation in bed mobility, seated exercises and transfer training this date with RUE sling donned pt with less c/o discomfort. Pt requiring heavy maxA to perform functional mobility tasks and up to totalA for dynamic standing tasks. SpO2 WFL on RA when good pleth signal obtained on 5L HF [place], HR WFL. Wound vac intermittently beeping due to seal, would stop when refresh button pressed for a few minutes, alarmed twice however during session, RN notified, power cord plugged in throughout. Pt bed placed in chair posture with pillows to promote neutral trunk/UE posture and to support R shoulder once back in bed, pt encouraged to keep HOB up fully upright for at least an hour to build tolerance for OOB activity once at SNF. Patient will benefit from continued inpatient follow up therapy, <3 hours/day.   If plan is discharge home, recommend the following: A lot of help with bathing/dressing/bathroom;Assistance with cooking/housework;Assist for transportation;Help with stairs or ramp for entrance;Two people to help with walking and/or transfers   Can travel by private vehicle     No  Equipment Recommendations  [place] lift;Hospital bed    Recommendations for Other Services       Precautions / Restrictions Precautions Precautions: Fall;Other (comment) Recall of Precautions/Restrictions: Intact Precaution/Restrictions  Comments: O2, wound VAC, R shoulder subluxation Required Braces or Orthoses: Sling;Other Brace (for standing/seated shoulder support RUE) Other Brace: RLE AFO from home Restrictions Weight Bearing Restrictions Per Provider Order: No     Mobility  Bed Mobility Overal bed mobility: Needs Assistance Bed Mobility: Rolling, Sidelying to Sit, Sit to Sidelying Rolling: Max assist, Used rails Sidelying to sit: Max assist, HOB elevated, Used rails     Sit to sidelying: Used rails, Max assist General bed mobility comments: Cues for sequencing, bed rail use, line awareness, pt has trouble sequencing  LUE while elevating trunk, able to slightly use R bed rail but then lets go abruptly; heavy maxA to achieve full upright posture at EOB. Similar assist return to supine pt able to use LUE on bed rail and BLE assist over edge of bed for return to R sidelying then roling to supine.    Transfers Overall transfer level: Needs assistance Equipment used:  (face to face HHA) Transfers: Sit to/from Stand Sit to Stand: Max assist, From elevated surface Stand pivot transfers: Max assist, From elevated surface         General transfer comment: face to face STS from EOB x3 with bil knees/feet blocked for safety/stability (pt with bil shoes and R AFO donned) and pt using LUE to assist with balance on therapist forearm/back of elbow, RUE sling donned. Pt maintains standing 10-30 seconds prior to pivoting to sit slightly to his R toward [facility] each rep. x2 total reps wtih increased time to extend bil knees after lift assist provided.    Ambulation/Gait  Pre-gait activities: x1 sidestep to his R with max/totalA face to face support with good LLE step but RLE knee buckling slightly while stepping.     Stairs             Wheelchair Mobility     Tilt Bed    Modified Rankin (Stroke Patients Only)       Balance Overall balance assessment: Needs assistance Sitting-balance support:  Single extremity supported, Feet supported Sitting balance-Leahy Scale: Fair Sitting balance - Comments: L lean due to R side discomfort but fair today wtih LUE support; poor unsupported while seated EOB Postural control: Left lateral lean Standing balance support: During functional activity, Single extremity supported Standing balance-Leahy Scale: Poor Standing balance comment: Poor to zero static standing at bedside                            Communication Communication Communication: Impaired Factors Affecting Communication: Other (comment);Reduced clarity of speech (hx CVA)  Cognition Arousal: Alert Behavior During Therapy: Flat affect   PT - Cognitive impairments: No family/caregiver present to determine baseline, Initiation, Sequencing, Problem solving, Safety/Judgement                       PT - Cognition Comments: Good participation as able Following commands: Impaired Following commands impaired: Only follows one step commands consistently, Follows multi-step commands inconsistently, Follows one step commands with increased time    Cueing Cueing Techniques: Verbal cues, Gestural cues, Tactile cues  Exercises Other Exercises Other Exercises: seated RLE AA/PROM: hip flexion, LAQ x10 reps ea with trace activation observed on some reps and RLE hamstring AAROM curls seated x5 reps with some activation noted Other Exercises: seated LLE AROM: hip flexion, LAQ x10 reps ea Other Exercises: Encouraged IS hourly use Other Exercises: standing LLE AROM: hip flexion x5 reps with R knee blocked Other Exercises: STS x 2 reps for BLE strengthening    General Comments        Pertinent Vitals/Pain Pain Assessment Pain Assessment: Faces Faces Pain Scale: Hurts little more Pain Location: intermittent c/o pain in RUE and minimal to no c/o R groin pain but pt tending to lean to L side while seated so appears to be hurting more than he admits. Pain Descriptors /  Indicators: Aching, Discomfort, Grimacing, Guarding Pain Intervention(s): Limited activity within patient's tolerance, Monitored during session, Repositioned    Home Living                          Prior Function            PT Goals (current goals can now be found in the care plan section) Acute Rehab PT Goals Patient Stated Goal: to go home PT Goal Formulation: With patient Time For Goal Achievement: 08/01/24 Progress towards PT goals: Progressing toward goals    Frequency    Min 2X/week      PT Plan      Co-evaluation              AM-PAC PT 6 Clicks Mobility   Outcome Measure  Help needed turning from your back to your side while in a flat bed without using bedrails?: Total Help needed moving from lying on your back to sitting on the side of a flat bed without using bedrails?: A Lot Help needed moving to and from a bed to a chair (including a wheelchair)?: A Lot Help  needed standing up from a chair using your arms (e.g., wheelchair or bedside chair)?: Total Help needed to walk in hospital room?: Total Help needed climbing 3-5 steps with a railing? : Total 6 Click Score: 8    End of Session Equipment Utilized During Treatment: Gait belt;Oxygen  (5L HF [place]) Activity Tolerance: Patient tolerated treatment well Patient left: in bed;with call bell/phone within reach;with bed alarm set;Other (comment) (bed in chair posture HOB 57 deg, heels floated) Nurse Communication: Mobility status PT Visit Diagnosis: Unsteadiness on feet (R26.81);Muscle weakness (generalized) (M62.81);Pain Pain - Right/Left: Right Pain - part of body: Leg     Time: 8790-8755 PT Time Calculation (min) (ACUTE ONLY): 35 min  Charges:    $Therapeutic Exercise: 8-22 mins $Therapeutic Activity: 8-22 mins PT General Charges $$ ACUTE PT VISIT: 1 Visit                     Maryjayne Kleven P., PTA Acute Rehabilitation Services Secure Chat Preferred 9a-5:30pm Office: [phone]     Connell HERO [facility] 07/25/2024, 1:03 PM

## 2024-07-25 NOTE — Progress Notes (Signed)
 AVS in packet for transport.
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
## 2024-07-25 NOTE — Assessment & Plan Note (Signed)
 COPD - 4L O2 via [place] at baseline. Home regimen of Trelegy Ellipta  1 puff daily (on Breztri  formulary equivalent); requiring additional O2 HFNC since admission A fib - Rate controlled with carvedilol  12.5 BID. Home Eliquis  5 mg twice daily HTN - Continue home carvedilol  12.5 mg BID, amlodipine  5 mg daily; hold hydralazine  25 mg daily HLD - Home regimen of Crestor  10 mg daily; LDL of 53, well controlled T2DM - Last A1c 4 months ago, 5.8.  HFpEF - Last echo 08/2023 with EF of 55-60%. Home regimen of carvedilol  6.25 mg twice daily Hx seizure - Home regimen of Keppra  500 mg twice daily  Anxiety/Depression - Home regimen of mirtazapine  7.5 mg daily  GERD - Home regimen of Protonix  40 mg daily  Neuropathy - Home gabapentin  300 mg AM + 600 mg PM PAD - continue Crestor  10 mg daily; lipids well-controlled; can consider increasing outpatient with PCP
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
## 2024-07-25 NOTE — Assessment & Plan Note (Signed)
 Cr back to baseline ~1.4 - Encourage p.o. intake

## 2024-07-25 NOTE — Assessment & Plan Note (Signed)
 POD8 s/p right femoral artery pseudoaneurysm repair. Per vascular, patient stable for discharge to SNF, just awaiting home wound vac. - Vasc surg consulted, appreciate recs - Continue home Eliquis  5 mg BID - Pain: Oxycodone  5 mg q6h for moderate pain, and Tylenol  650 mg Q4H for mild pain - Bowel regimen: Miralax  and Senna daily prn for constipation - Incentive spirometry - VAC in place (changed 9/29) - PT/OT recommending SNF for STR - Final wound culture w/ no growth - Antibiotics completed: IV Vancomycin  (9/22-9/24), IV Zosyn  (9/22-9/28), IV Linezolid  (9/24-9/28)
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
## 2024-07-27 DIAGNOSIS — I724 Aneurysm of artery of lower extremity: Secondary | ICD-10-CM | POA: Diagnosis not present

## 2024-07-27 DIAGNOSIS — K219 Gastro-esophageal reflux disease without esophagitis: Secondary | ICD-10-CM | POA: Diagnosis not present

## 2024-07-27 DIAGNOSIS — I739 Peripheral vascular disease, unspecified: Secondary | ICD-10-CM | POA: Diagnosis not present

## 2024-07-27 DIAGNOSIS — J449 Chronic obstructive pulmonary disease, unspecified: Secondary | ICD-10-CM | POA: Diagnosis not present

## 2024-07-27 DIAGNOSIS — J9601 Acute respiratory failure with hypoxia: Secondary | ICD-10-CM | POA: Diagnosis not present

## 2024-07-27 DIAGNOSIS — F419 Anxiety disorder, unspecified: Secondary | ICD-10-CM | POA: Diagnosis not present

## 2024-07-27 DIAGNOSIS — G8194 Hemiplegia, unspecified affecting left nondominant side: Secondary | ICD-10-CM | POA: Diagnosis not present

## 2024-07-27 DIAGNOSIS — E118 Type 2 diabetes mellitus with unspecified complications: Secondary | ICD-10-CM | POA: Diagnosis not present

## 2024-07-27 DIAGNOSIS — E1140 Type 2 diabetes mellitus with diabetic neuropathy, unspecified: Secondary | ICD-10-CM | POA: Diagnosis not present

## 2024-07-28 DIAGNOSIS — E118 Type 2 diabetes mellitus with unspecified complications: Secondary | ICD-10-CM | POA: Diagnosis not present

## 2024-07-28 DIAGNOSIS — G40909 Epilepsy, unspecified, not intractable, without status epilepticus: Secondary | ICD-10-CM | POA: Diagnosis not present

## 2024-07-28 DIAGNOSIS — I10 Essential (primary) hypertension: Secondary | ICD-10-CM | POA: Diagnosis not present

## 2024-07-28 DIAGNOSIS — K219 Gastro-esophageal reflux disease without esophagitis: Secondary | ICD-10-CM | POA: Diagnosis not present

## 2024-07-28 DIAGNOSIS — J449 Chronic obstructive pulmonary disease, unspecified: Secondary | ICD-10-CM | POA: Diagnosis not present

## 2024-07-28 DIAGNOSIS — I739 Peripheral vascular disease, unspecified: Secondary | ICD-10-CM | POA: Diagnosis not present

## 2024-07-28 DIAGNOSIS — I724 Aneurysm of artery of lower extremity: Secondary | ICD-10-CM | POA: Diagnosis not present

## 2024-07-28 DIAGNOSIS — R5381 Other malaise: Secondary | ICD-10-CM | POA: Diagnosis not present

## 2024-07-31 DIAGNOSIS — R5381 Other malaise: Secondary | ICD-10-CM | POA: Diagnosis not present

## 2024-07-31 DIAGNOSIS — I724 Aneurysm of artery of lower extremity: Secondary | ICD-10-CM | POA: Diagnosis not present

## 2024-07-31 DIAGNOSIS — G40909 Epilepsy, unspecified, not intractable, without status epilepticus: Secondary | ICD-10-CM | POA: Diagnosis not present

## 2024-07-31 DIAGNOSIS — J449 Chronic obstructive pulmonary disease, unspecified: Secondary | ICD-10-CM | POA: Diagnosis not present

## 2024-07-31 DIAGNOSIS — I739 Peripheral vascular disease, unspecified: Secondary | ICD-10-CM | POA: Diagnosis not present

## 2024-07-31 DIAGNOSIS — I10 Essential (primary) hypertension: Secondary | ICD-10-CM | POA: Diagnosis not present

## 2024-07-31 DIAGNOSIS — E118 Type 2 diabetes mellitus with unspecified complications: Secondary | ICD-10-CM | POA: Diagnosis not present

## 2024-07-31 DIAGNOSIS — K219 Gastro-esophageal reflux disease without esophagitis: Secondary | ICD-10-CM | POA: Diagnosis not present

## 2024-08-01 ENCOUNTER — Telehealth: Payer: Self-pay

## 2024-08-01 DIAGNOSIS — G8194 Hemiplegia, unspecified affecting left nondominant side: Secondary | ICD-10-CM | POA: Diagnosis not present

## 2024-08-01 DIAGNOSIS — J449 Chronic obstructive pulmonary disease, unspecified: Secondary | ICD-10-CM | POA: Diagnosis not present

## 2024-08-01 DIAGNOSIS — E118 Type 2 diabetes mellitus with unspecified complications: Secondary | ICD-10-CM | POA: Diagnosis not present

## 2024-08-01 DIAGNOSIS — I724 Aneurysm of artery of lower extremity: Secondary | ICD-10-CM | POA: Diagnosis not present

## 2024-08-01 DIAGNOSIS — Z48812 Encounter for surgical aftercare following surgery on the circulatory system: Secondary | ICD-10-CM | POA: Diagnosis not present

## 2024-08-01 DIAGNOSIS — I739 Peripheral vascular disease, unspecified: Secondary | ICD-10-CM | POA: Diagnosis not present

## 2024-08-01 DIAGNOSIS — J9601 Acute respiratory failure with hypoxia: Secondary | ICD-10-CM | POA: Diagnosis not present

## 2024-08-01 DIAGNOSIS — G40909 Epilepsy, unspecified, not intractable, without status epilepticus: Secondary | ICD-10-CM | POA: Diagnosis not present

## 2024-08-01 DIAGNOSIS — K219 Gastro-esophageal reflux disease without esophagitis: Secondary | ICD-10-CM | POA: Diagnosis not present

## 2024-08-01 NOTE — Telephone Encounter (Signed)
 Rec'd 1 box Tresiba  U-100 Rec'd 3 boxes Novolog  Flexpens

## 2024-08-02 DIAGNOSIS — E118 Type 2 diabetes mellitus with unspecified complications: Secondary | ICD-10-CM | POA: Diagnosis not present

## 2024-08-02 DIAGNOSIS — Z48812 Encounter for surgical aftercare following surgery on the circulatory system: Secondary | ICD-10-CM | POA: Diagnosis not present

## 2024-08-02 DIAGNOSIS — J449 Chronic obstructive pulmonary disease, unspecified: Secondary | ICD-10-CM | POA: Diagnosis not present

## 2024-08-03 DIAGNOSIS — I739 Peripheral vascular disease, unspecified: Secondary | ICD-10-CM | POA: Diagnosis not present

## 2024-08-03 DIAGNOSIS — J449 Chronic obstructive pulmonary disease, unspecified: Secondary | ICD-10-CM | POA: Diagnosis not present

## 2024-08-03 DIAGNOSIS — I724 Aneurysm of artery of lower extremity: Secondary | ICD-10-CM | POA: Diagnosis not present

## 2024-08-03 DIAGNOSIS — K219 Gastro-esophageal reflux disease without esophagitis: Secondary | ICD-10-CM | POA: Diagnosis not present

## 2024-08-03 DIAGNOSIS — E118 Type 2 diabetes mellitus with unspecified complications: Secondary | ICD-10-CM | POA: Diagnosis not present

## 2024-08-03 DIAGNOSIS — E1140 Type 2 diabetes mellitus with diabetic neuropathy, unspecified: Secondary | ICD-10-CM | POA: Diagnosis not present

## 2024-08-03 DIAGNOSIS — Z48812 Encounter for surgical aftercare following surgery on the circulatory system: Secondary | ICD-10-CM | POA: Diagnosis not present

## 2024-08-03 DIAGNOSIS — J9601 Acute respiratory failure with hypoxia: Secondary | ICD-10-CM | POA: Diagnosis not present

## 2024-08-03 DIAGNOSIS — G8194 Hemiplegia, unspecified affecting left nondominant side: Secondary | ICD-10-CM | POA: Diagnosis not present

## 2024-08-03 NOTE — Telephone Encounter (Signed)
 Spoke with patients wife and informed her his insulins are ready.  Would like a letter with info about LIS mailed to them so they can apply for next year.

## 2024-08-04 DIAGNOSIS — G40909 Epilepsy, unspecified, not intractable, without status epilepticus: Secondary | ICD-10-CM | POA: Diagnosis not present

## 2024-08-04 DIAGNOSIS — E118 Type 2 diabetes mellitus with unspecified complications: Secondary | ICD-10-CM | POA: Diagnosis not present

## 2024-08-04 DIAGNOSIS — R5381 Other malaise: Secondary | ICD-10-CM | POA: Diagnosis not present

## 2024-08-04 DIAGNOSIS — I724 Aneurysm of artery of lower extremity: Secondary | ICD-10-CM | POA: Diagnosis not present

## 2024-08-04 DIAGNOSIS — K219 Gastro-esophageal reflux disease without esophagitis: Secondary | ICD-10-CM | POA: Diagnosis not present

## 2024-08-04 DIAGNOSIS — I739 Peripheral vascular disease, unspecified: Secondary | ICD-10-CM | POA: Diagnosis not present

## 2024-08-04 DIAGNOSIS — J449 Chronic obstructive pulmonary disease, unspecified: Secondary | ICD-10-CM | POA: Diagnosis not present

## 2024-08-04 DIAGNOSIS — I10 Essential (primary) hypertension: Secondary | ICD-10-CM | POA: Diagnosis not present

## 2024-08-07 DIAGNOSIS — E118 Type 2 diabetes mellitus with unspecified complications: Secondary | ICD-10-CM | POA: Diagnosis not present

## 2024-08-07 DIAGNOSIS — K219 Gastro-esophageal reflux disease without esophagitis: Secondary | ICD-10-CM | POA: Diagnosis not present

## 2024-08-07 DIAGNOSIS — I724 Aneurysm of artery of lower extremity: Secondary | ICD-10-CM | POA: Diagnosis not present

## 2024-08-07 DIAGNOSIS — I10 Essential (primary) hypertension: Secondary | ICD-10-CM | POA: Diagnosis not present

## 2024-08-07 DIAGNOSIS — G40909 Epilepsy, unspecified, not intractable, without status epilepticus: Secondary | ICD-10-CM | POA: Diagnosis not present

## 2024-08-07 DIAGNOSIS — R5381 Other malaise: Secondary | ICD-10-CM | POA: Diagnosis not present

## 2024-08-07 DIAGNOSIS — J449 Chronic obstructive pulmonary disease, unspecified: Secondary | ICD-10-CM | POA: Diagnosis not present

## 2024-08-07 DIAGNOSIS — I739 Peripheral vascular disease, unspecified: Secondary | ICD-10-CM | POA: Diagnosis not present

## 2024-08-14 ENCOUNTER — Other Ambulatory Visit: Payer: Self-pay

## 2024-08-14 DIAGNOSIS — I69392 Facial weakness following cerebral infarction: Secondary | ICD-10-CM | POA: Diagnosis not present

## 2024-08-14 DIAGNOSIS — J449 Chronic obstructive pulmonary disease, unspecified: Secondary | ICD-10-CM | POA: Diagnosis not present

## 2024-08-14 DIAGNOSIS — J9611 Chronic respiratory failure with hypoxia: Secondary | ICD-10-CM | POA: Diagnosis not present

## 2024-08-14 DIAGNOSIS — I69351 Hemiplegia and hemiparesis following cerebral infarction affecting right dominant side: Secondary | ICD-10-CM | POA: Diagnosis not present

## 2024-08-14 DIAGNOSIS — I69322 Dysarthria following cerebral infarction: Secondary | ICD-10-CM | POA: Diagnosis not present

## 2024-08-14 DIAGNOSIS — J9612 Chronic respiratory failure with hypercapnia: Secondary | ICD-10-CM | POA: Diagnosis not present

## 2024-08-14 DIAGNOSIS — I130 Hypertensive heart and chronic kidney disease with heart failure and stage 1 through stage 4 chronic kidney disease, or unspecified chronic kidney disease: Secondary | ICD-10-CM | POA: Diagnosis not present

## 2024-08-14 DIAGNOSIS — E1151 Type 2 diabetes mellitus with diabetic peripheral angiopathy without gangrene: Secondary | ICD-10-CM | POA: Diagnosis not present

## 2024-08-14 DIAGNOSIS — T22221D Burn of second degree of right elbow, subsequent encounter: Secondary | ICD-10-CM | POA: Diagnosis not present

## 2024-08-15 ENCOUNTER — Ambulatory Visit: Attending: Vascular Surgery | Admitting: Physician Assistant

## 2024-08-15 VITALS — BP 176/103 | HR 77 | Temp 98.2°F | Resp 18 | Ht 71.0 in | Wt 126.0 lb

## 2024-08-15 DIAGNOSIS — I724 Aneurysm of artery of lower extremity: Secondary | ICD-10-CM

## 2024-08-15 DIAGNOSIS — S31109D Unspecified open wound of abdominal wall, unspecified quadrant without penetration into peritoneal cavity, subsequent encounter: Secondary | ICD-10-CM

## 2024-08-15 NOTE — Progress Notes (Signed)
 POST OPERATIVE OFFICE NOTE    CC:  F/u for surgery  HPI: Kyle [name] is a 73 y.o. male who is here for wound check.  He recently underwent the following surgery: 1) repair of right common femoral pseudoaneurysm, 2) right common femoral to profunda bypass with dacryon, 3) revision of right common femoral to peroneal vein graft with new proximal anastomosis to dacryon interposition, 4) right lower extremity thrombectomy, 5) sartorius muscle flap, 6) right groin wound VAC placement.   He returns today for follow-up.  He was recently discharged from his SNF on Thursday.  He says that they changed his wound VAC 2-3 times weekly since his discharge from the hospital.  They removed the wound VAC on Thursday before he left.  Since then, the patient's wife has been cleaning his wound and placing a dry dressing on top of it.  He denies any signs of infection such as purulent drainage, tenderness, erythema, or wound breakdown.  He endorses some intermittent pain in the right thigh.  He denies any pain in his right foot.  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   No Known Allergies  Current Outpatient Medications  Medication Sig Dispense Refill   Accu-Chek FastClix Lancets MISC Inject 102 each into the skin 3 (three) times daily. 1 each 6   ACCU-CHEK GUIDE TEST test strip 1 each by Other route 3 (three) times daily.     acetaminophen  (TYLENOL ) 325 MG tablet Take 2 tablets (650 mg total) by mouth every 4 (four) hours as needed for mild pain (pain score 1-3).     albuterol  (PROVENTIL ) (2.5 MG/3ML) 0.083% nebulizer solution Take 3 mLs (2.5 mg total) by nebulization every 4 (four) hours as needed for wheezing or shortness of breath.     amLODipine  (NORVASC ) 5 MG tablet Take 5 mg by mouth at bedtime.     apixaban  (ELIQUIS ) 5 MG TABS tablet Take 1 tablet (5 mg total) by mouth 2 (two) times daily. 180 tablet 3   carvedilol  (COREG ) 12.5 MG tablet Take 1 tablet (12.5 mg total) by  mouth 2 (two) times daily with a meal.     DROPLET PEN NEEDLES 32G X 4 MM MISC USE AS DIRECTED WITH INSULIN  300 each 3   Fluticasone -Umeclidin-Vilant (TRELEGY ELLIPTA ) 200-62.5-25 MCG/ACT AEPB INHALE 1 PUFF EVERY DAY 180 each 0   gabapentin  (NEURONTIN ) 300 MG capsule Take 1 capsule (300 mg total) by mouth daily. In the morning or afternoon (Patient taking differently: Take 300 mg by mouth daily.) 30 capsule 3   gabapentin  (NEURONTIN ) 600 MG tablet Take 1 tablet (600 mg total) by mouth at bedtime. 90 tablet 0   [Paused] hydrALAZINE  (APRESOLINE ) 25 MG tablet Take 1 tablet (25 mg total) by mouth in the morning, at noon, and at bedtime. 270 tablet 0   [Paused] isosorbide  mononitrate (IMDUR ) 30 MG 24 hr tablet Take 1 tablet (30 mg total) by mouth daily. 90 tablet 3   levETIRAcetam  (KEPPRA ) 500 MG tablet Take 1 tablet (500 mg total) by mouth 2 (two) times daily. 180 tablet 0   mirtazapine  (REMERON ) 7.5 MG tablet Take 1 tablet (7.5 mg total) by mouth at bedtime. 90 tablet 3   MUCINEX  600 MG 12 hr tablet Take 600 mg by mouth 2 (two) times daily.     Multiple Vitamins-Minerals (CENTRUM MEN) TABS Take 1 tablet by mouth daily with breakfast.     NOVOLOG  FLEXPEN 100 UNIT/ML FlexPen Inject 0-10 Units into  the skin See admin instructions. Inject 0-10 units into the skin three times a day before meals, per Sliding Scale: BGL 0-59 OR 400-1,000 = CALL MD; 60-150 = give nothing; 151-199 = 2 units; 200-249 = 4 units; 250-299 = 6 units; 300-349 = 8 units; 350-399 = 10 units     OXYGEN  Inhale 4 L/min into the lungs continuous.     pantoprazole  (PROTONIX ) 40 MG tablet Take 1 tablet (40 mg total) by mouth daily. 90 tablet 0   polyethylene glycol (MIRALAX  / GLYCOLAX ) 17 g packet Take 17 g by mouth 2 (two) times daily as needed for mild constipation or moderate constipation.     rosuvastatin  (CRESTOR ) 10 MG tablet Take 1 tablet (10 mg total) by mouth at bedtime. 90 tablet 3   No current facility-administered medications for  this visit.     ROS:  See HPI  Physical Exam:  Incision: Right groin wound healing well without signs of infection.  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 male who is here for postop visit  - The patient recently underwent extensive surgery including right groin interposition dacryon grafting, right lower extremity thrombectomy, repair of right common femoral pseudoaneurysm, revision of prior femoral to peroneal artery bypass, sartorius muscle flap, and wound VAC placement.  Ultimately he was discharged to SNF with a wound VAC in the right groin -His right lower extremity remains well-perfused with a brisk peroneal Doppler signal -His wound VAC was discontinued on Thursday after he was discharged home from his SNF.  His wife has been keeping the wound clean and dry since then.  He denies any signs of infection such as erythema, wound breakdown, or purulent drainage -On exam his right groin wound appears very healthy and is healing well.  There are no signs of infection. - I have explained to the wife that he will require daily wet-to-dry dressing changes to this area to encourage healing.  The patient does have home health nursing, which comes out a couple times a week.  The wife is able to do wet-to-dry dressings when home health is not present. - He can return to clinic in 3 to 4 weeks for repeat wound check   Ahmed Holster, PA-C [facility] [phone]   Clinic MD:  Gretta

## 2024-08-18 ENCOUNTER — Ambulatory Visit (INDEPENDENT_AMBULATORY_CARE_PROVIDER_SITE_OTHER): Admitting: Family Medicine

## 2024-08-18 ENCOUNTER — Encounter: Payer: Self-pay | Admitting: Family Medicine

## 2024-08-18 VITALS — BP 128/68 | HR 76

## 2024-08-18 DIAGNOSIS — I724 Aneurysm of artery of lower extremity: Secondary | ICD-10-CM

## 2024-08-18 DIAGNOSIS — E118 Type 2 diabetes mellitus with unspecified complications: Secondary | ICD-10-CM | POA: Diagnosis not present

## 2024-08-18 DIAGNOSIS — Z23 Encounter for immunization: Secondary | ICD-10-CM | POA: Diagnosis not present

## 2024-08-18 DIAGNOSIS — E1151 Type 2 diabetes mellitus with diabetic peripheral angiopathy without gangrene: Secondary | ICD-10-CM | POA: Diagnosis not present

## 2024-08-18 DIAGNOSIS — E1159 Type 2 diabetes mellitus with other circulatory complications: Secondary | ICD-10-CM | POA: Diagnosis not present

## 2024-08-18 DIAGNOSIS — I739 Peripheral vascular disease, unspecified: Secondary | ICD-10-CM

## 2024-08-18 DIAGNOSIS — Z794 Long term (current) use of insulin: Secondary | ICD-10-CM

## 2024-08-18 DIAGNOSIS — T22221D Burn of second degree of right elbow, subsequent encounter: Secondary | ICD-10-CM | POA: Diagnosis not present

## 2024-08-18 DIAGNOSIS — J9611 Chronic respiratory failure with hypoxia: Secondary | ICD-10-CM | POA: Diagnosis not present

## 2024-08-18 DIAGNOSIS — J449 Chronic obstructive pulmonary disease, unspecified: Secondary | ICD-10-CM | POA: Diagnosis not present

## 2024-08-18 DIAGNOSIS — I69351 Hemiplegia and hemiparesis following cerebral infarction affecting right dominant side: Secondary | ICD-10-CM | POA: Diagnosis not present

## 2024-08-18 DIAGNOSIS — I130 Hypertensive heart and chronic kidney disease with heart failure and stage 1 through stage 4 chronic kidney disease, or unspecified chronic kidney disease: Secondary | ICD-10-CM | POA: Diagnosis not present

## 2024-08-18 DIAGNOSIS — I152 Hypertension secondary to endocrine disorders: Secondary | ICD-10-CM

## 2024-08-18 DIAGNOSIS — I69392 Facial weakness following cerebral infarction: Secondary | ICD-10-CM | POA: Diagnosis not present

## 2024-08-18 DIAGNOSIS — I69322 Dysarthria following cerebral infarction: Secondary | ICD-10-CM | POA: Diagnosis not present

## 2024-08-18 DIAGNOSIS — J9612 Chronic respiratory failure with hypercapnia: Secondary | ICD-10-CM | POA: Diagnosis not present

## 2024-08-18 NOTE — Assessment & Plan Note (Signed)
 Will do further chart review and determine whether increasing statin to high intensity dose is appropriate at this time.

## 2024-08-18 NOTE — Assessment & Plan Note (Signed)
" >>  ASSESSMENT AND PLAN FOR PERIPHERAL ARTERIAL DISEASE WRITTEN ON 08/18/2024  5:05 PM BY Wilmont Olund, MD  Will do further chart review and determine whether increasing statin to high intensity dose is appropriate at this time. "

## 2024-08-18 NOTE — Assessment & Plan Note (Signed)
 Blood pressure remains well-controlled today.  Advised continuing to hold off on blood pressure meds for now.  Wife states she checks his blood pressure 3 times a day at home, provided guidance around goal of less than 130/80.

## 2024-08-18 NOTE — Patient Instructions (Addendum)
 Thank you for coming in today! Here is a summary of what we discussed:  -We can discontinue the 300mg  gabapentin  during the day if pain is well controlled  -Please schedule a follow up with the podiatrist as able  -Keep checking BP, as long as it's around 130/80 or less we can continue to hold off on the BP meds  -Please drop off your urine sample when you can. Please keep it in the fridge at home.  Please call the clinic at [phone] if your symptoms worsen or you have any concerns.  Best, Dr Adele

## 2024-08-18 NOTE — Assessment & Plan Note (Addendum)
 Appears that blood sugars are well-controlled at home.  Advised continuing to monitor and use SSI as indicated.  Will continue 5 units of long-acting insulin  daily.  Due for urine microalbumin creatinine ratio.  Will collect a urine sample at home and drop off at [facility] lab when able.

## 2024-08-18 NOTE — Progress Notes (Signed)
     SUBJECTIVE:   CHIEF COMPLAINT / HPI:   Kyle [name] presents today for hospital follow up.   Hospitalized at [facility] 9/22-9/30 postop R femoral pseudoaneurysm repair D/c to SNF, was there for 4 weeks and had a good experience overall.  Since discharge, patient reports doing well.  States his pain is gone, followed up with vascular surgery on 10/21, wound appears appropriate, they recommended wet-to-dry dressings daily.  Wife is caregiver and they also have home health RN as well as PT.  Blood sugars have been looking good at home, has not had to use the SSI very much. Still doing 5u long acting.  Wants to d/c daytime gabapentin , feels pain is better controlled after surgery.  Still wants to take 600 mg of gabapentin  nightly  PERTINENT  PMH / PSH: CHF, A-fib, hypertension, PAD, pulmonary hypertension, type 2 diabetes, history of stroke, wheelchair-bound  OBJECTIVE:   BP 128/68   Pulse 76   SpO2 97%   - General: No acute distress. Awake and conversant. - Eyes: Normal conjunctiva, anicteric. Round symmetric pupils. - ENT: Hearing grossly intact. No nasal discharge. - Neck: Neck is supple. No masses or thyromegaly. - Respiratory: Respirations are non-labored. - Skin: No visible rashes or ulcers. - Psych: Alert and oriented. Cooperative, Appropriate mood and affect, Normal judgment.   ASSESSMENT/PLAN:   Assessment & Plan Controlled type 2 diabetes mellitus with complication, with long-term current use of insulin  (HCC) Appears that blood sugars are well-controlled at home.  Advised continuing to monitor and use SSI as indicated.  Will continue 5 units of long-acting insulin  daily.  Due for urine microalbumin creatinine ratio.  Will collect a urine sample at home and drop off at [facility] lab when able. Pseudoaneurysm of femoral artery following procedure Doing well after hospitalization and stay at SNF.  Has already followed up with vascular surgery and does not have questions  currently about ongoing care. Hypertension associated with diabetes (HCC) Blood pressure remains well-controlled today.  Advised continuing to hold off on blood pressure meds for now.  Wife states she checks his blood pressure 3 times a day at home, provided guidance around goal of less than 130/80. Peripheral arterial disease Will do further chart review and determine whether increasing statin to high intensity dose is appropriate at this time.     Rea Raring, MD [facility]

## 2024-08-18 NOTE — Assessment & Plan Note (Signed)
 Doing well after hospitalization and stay at SNF.  Has already followed up with vascular surgery and does not have questions currently about ongoing care.

## 2024-08-22 ENCOUNTER — Ambulatory Visit

## 2024-08-28 ENCOUNTER — Ambulatory Visit: Admitting: Adult Health

## 2024-08-28 ENCOUNTER — Encounter: Payer: Self-pay | Admitting: Adult Health

## 2024-08-28 VITALS — BP 148/61 | HR 68 | Ht 71.0 in

## 2024-08-28 DIAGNOSIS — G8110 Spastic hemiplegia affecting unspecified side: Secondary | ICD-10-CM

## 2024-08-28 DIAGNOSIS — I610 Nontraumatic intracerebral hemorrhage in hemisphere, subcortical: Secondary | ICD-10-CM

## 2024-08-28 NOTE — Patient Instructions (Signed)
 Continue working with home health therapy for hopeful ongoing recovery --- keep up the good work!!   Continue to follow closely with PCP and cardiology for stroke risk factor management    Follow up with Dr. Rosemarie in 6-9 months or call earlier if needed

## 2024-08-28 NOTE — Progress Notes (Signed)
 [facility] [address] [place]. [name] [address] [phone]       OFFICE FOLLOW-UP NOTE  [name] Date of Birth:  02/13/1951 Medical Record Number:  [id number]    Primary neurologist: Dr. Rosemarie Reason for visit: Stroke follow-up   Chief Complaint  Patient presents with   Follow-up    Pt in 3 with wife  Pt here for stroke f/u Pt is currently on continuous o2 4L          HPI:   Update 08/28/2024 JM: Patient returns for follow-up visit after prior visit with Dr. Rosemarie 10 months ago.  He is accompanied by his wife.  No new stroke/TIA symptoms.  Continued right spastic hemiparesis which has been overall stable since prior visit. Reports gradual improvement working with therapies since prior visit but in September, he started to experience worsening right groin pain which limited RLE movement.  He underwent right femoral artery pseudoaneurysm repair on 9/22, has been gradually recovering. Feels RLE weaker since September due to pain and limited movement but has restarted PT and feels gradually improving.  Routinely follows with vascular surgery.  He is able to stand with PT but otherwise transfers via wheelchair. Remains on Eliquis  and Crestor  without side effects.  Continues to follow with PCP and cardiology for stroke risk factor management.  He also reports being on Keppra  chronically for history of seizures which has been managed by PCP, denies any seizure activity.  No further questions or concerns at this time.       Initial visit 11/15/2023 Dr. Rosemarie: [name] is a 73 year old African-American male seen today for initial office follow-up visit following hospital admission for intracerebral hemorrhage in November 2024.  History is obtained from the patient and his wife who is accompanying her today as well as review of electronic medical records and I personally reviewed pertinent available imaging films in PACS.  He has past medical history of  COPD on home oxygen , dilated cardiomyopathy, peripheral arterial disease, history of femoral-popliteal bypass in 2010, aortobifemoral bypass in 2016 atrial fibrillation on Eliquis , chronic kidney disease, pulmonary hypertension and aortic insufficiency.  He presented on 09/06/2023 upon awakening from sleep and falling on the floor when he tried to get up.  Wife called EMS noticed that he had significantly elevated blood pressure of 210 systolic.  He was brought in as a code stroke.  On exam his ICH score was 0 and CT head showed a left lentiform nucleus intracerebral hemorrhage without intraventricular extension and hydrocephalus.  Patient was on Eliquis  and was given Kcentra  for Eliquis  reversal.  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  CT scan chest abdomen pelvis was done due to concerns about aortic dissection which showed extensive aortic atherosclerosis with multiple penetrating ulcers and ulcerated plaque with suprarenal aortic dilatation 3.7 x 2.9 cm, large pseudoaneurysm in the region of the distal aortobifemoral bypass graft high-grade stenosis proximal to right renal artery just beyond the ostium with moderate right renal atrophy, he was seen by cardiothoracic surgery who felt no obvious dissection or intramural hematoma to require intervention.  Vascular surgery was also consulted and did not feel the pseudoaneurysm was new or required any intervention since patient had well perfused pulses in the right foot.  Patient was transferred to inpatient rehab where he stayed for a few weeks before being transferred to skilled nursing facility at [facility] for ongoing rehab needs.  Patient is still significantly weak on the right side with practically no movement.  He is unable to stand without two-person assist but is not able to walk yet.  He feels the pain in his right groin is often limiting his ability to ambulate.  His Eliquis   was discontinued for 4 weeks after his hemorrhage and now he is back on it.  He continues to have significant dysarthria and severe right hemiplegia.  He spends most of the time in a wheelchair except when working with therapist he can stand with two-person support.  ROS:   14 system review of systems is positive for those listed in HPI and all other systems negative  PMH:  Past Medical History:  Diagnosis Date   Acute hypoxic on chronic hypercapnic respiratory failure (HCC) 01/05/2024   Acute on chronic respiratory failure with hypoxia (HCC) 12/04/2023   Aortic insufficiency    MODERATE WITH A BICUSPID AORTIC VAVLE   Aortic stenosis 09/12/2014   Moderate with AVA 1.3 cm, pk 30 mmHg, echo Oct 2015     Aortoiliac occlusive disease (HCC) 12/16/2014   Arterial occlusive disease    MULTILEVEL   Atherosclerosis of artery of extremity with rest pain (HCC) 04/21/2019   Atrial fibrillation (HCC)    CHF (congestive heart failure) (HCC)    Chronic bronchitis (HCC)    CKD (chronic kidney disease) stage 3, GFR 30-59 ml/min (HCC)    COPD (chronic obstructive pulmonary disease) (HCC)    Dilated cardiomyopathy (HCC)    WITH EJECTION FRACTION DOWN TO 20-25%--WITH CONGESTIVE HEART FAILURE   Edema    LOWER EXTREMETIES   Emphysema of lung (HCC)    Hemorrhagic stroke (HCC) 09/06/2023   right side defecit   Hypertension    ICH (intracerebral hemorrhage) (HCC) 09/10/2023   Insulin  dependent type 2 diabetes mellitus (HCC)    Normal coronary arteries 2009   Orthopnea    Peripheral arterial disease    Pulmonary hypertension (HCC)    Stroke [facility])     Social History:  Social History   Socioeconomic History   Marital status: Married    Spouse name: Not on file   Number of children: 1   Years of education: Not on file   Highest education level: GED or equivalent  Occupational History   Occupation: retired    Associate Professor: [employer]  Tobacco Use   Smoking status: Former    Current packs/day:  0.00    Average packs/day: 1 pack/day for 40.0 years (40.0 ttl pk-yrs)    Types: Cigarettes    Start date: 10/15/1978    Quit date: 10/15/2018    Years since quitting: 5.8    Passive exposure: Never   Smokeless tobacco: Never   Tobacco comments:    quit 2010 and quit again 2019  Vaping Use   Vaping status: Never Used  Substance and Sexual Activity   Alcohol use: No    Alcohol/week: 0.0 standard drinks of alcohol   Drug use: No   Sexual activity: Not on file  Other Topics Concern   Not on file  Social History Narrative   Retired   Lives with family     Social Drivers of Health   Financial Resource Strain: Medium Risk (08/17/2024)   Overall Financial Resource Strain (CARDIA)    Difficulty of Paying Living Expenses: Somewhat hard  Food Insecurity: Food Insecurity Present (08/17/2024)   Hunger Vital Sign    Worried About Running Out of Food in the Last Year: Sometimes true  Ran Out of Food in the Last Year: Sometimes true  Transportation Needs: No Transportation Needs (08/17/2024)   PRAPARE - Administrator, Civil Service (Medical): No    Lack of Transportation (Non-Medical): No  Physical Activity: Inactive (08/17/2024)   Exercise Vital Sign    Days of Exercise per Week: 0 days    Minutes of Exercise per Session: Not on file  Stress: No Stress Concern Present (08/17/2024)   [employer] of Occupational Health - Occupational Stress Questionnaire    Feeling of Stress: Not at all  Social Connections: Moderately Isolated (08/17/2024)   Social Connection and Isolation Panel    Frequency of Communication with Friends and Family: More than three times a week    Frequency of Social Gatherings with Friends and Family: Twice a week    Attends Religious Services: Patient declined    Database Administrator or Organizations: No    Attends Engineer, Structural: Not on file    Marital Status: Married  Catering Manager Violence: Not At Risk (07/20/2024)    Humiliation, Afraid, Rape, and Kick questionnaire    Fear of Current or Ex-Partner: No    Emotionally Abused: No    Physically Abused: No    Sexually Abused: No    Medications:   Current Outpatient Medications on File Prior to Visit  Medication Sig Dispense Refill   Accu-Chek FastClix Lancets MISC Inject 102 each into the skin 3 (three) times daily. 1 each 6   ACCU-CHEK GUIDE TEST test strip 1 each by Other route 3 (three) times daily.     acetaminophen  (TYLENOL ) 325 MG tablet Take 2 tablets (650 mg total) by mouth every 4 (four) hours as needed for mild pain (pain score 1-3).     albuterol  (PROVENTIL ) (2.5 MG/3ML) 0.083% nebulizer solution Take 3 mLs (2.5 mg total) by nebulization every 4 (four) hours as needed for wheezing or shortness of breath.     amLODipine  (NORVASC ) 5 MG tablet Take 5 mg by mouth at bedtime.     apixaban  (ELIQUIS ) 5 MG TABS tablet Take 1 tablet (5 mg total) by mouth 2 (two) times daily. 180 tablet 3   carvedilol  (COREG ) 12.5 MG tablet Take 1 tablet (12.5 mg total) by mouth 2 (two) times daily with a meal.     DROPLET PEN NEEDLES 32G X 4 MM MISC USE AS DIRECTED WITH INSULIN  300 each 3   Fluticasone -Umeclidin-Vilant (TRELEGY ELLIPTA ) 200-62.5-25 MCG/ACT AEPB INHALE 1 PUFF EVERY DAY 180 each 0   gabapentin  (NEURONTIN ) 600 MG tablet Take 1 tablet (600 mg total) by mouth at bedtime. 90 tablet 0   [Paused] hydrALAZINE  (APRESOLINE ) 25 MG tablet Take 1 tablet (25 mg total) by mouth in the morning, at noon, and at bedtime. 270 tablet 0   [Paused] isosorbide  mononitrate (IMDUR ) 30 MG 24 hr tablet Take 1 tablet (30 mg total) by mouth daily. 90 tablet 3   levETIRAcetam  (KEPPRA ) 500 MG tablet Take 1 tablet (500 mg total) by mouth 2 (two) times daily. 180 tablet 0   mirtazapine  (REMERON ) 7.5 MG tablet Take 1 tablet (7.5 mg total) by mouth at bedtime. 90 tablet 3   MUCINEX  600 MG 12 hr tablet Take 600 mg by mouth 2 (two) times daily.     Multiple Vitamins-Minerals (CENTRUM MEN) TABS  Take 1 tablet by mouth daily with breakfast.     NOVOLOG  FLEXPEN 100 UNIT/ML FlexPen Inject 0-10 Units into the skin See admin instructions. Inject 0-10 units into  the skin three times a day before meals, per Sliding Scale: BGL 0-59 OR 400-1,000 = CALL MD; 60-150 = give nothing; 151-199 = 2 units; 200-249 = 4 units; 250-299 = 6 units; 300-349 = 8 units; 350-399 = 10 units     OXYGEN  Inhale 4 L/min into the lungs continuous.     pantoprazole  (PROTONIX ) 40 MG tablet Take 1 tablet (40 mg total) by mouth daily. 90 tablet 0   polyethylene glycol (MIRALAX  / GLYCOLAX ) 17 g packet Take 17 g by mouth 2 (two) times daily as needed for mild constipation or moderate constipation.     rosuvastatin  (CRESTOR ) 10 MG tablet Take 1 tablet (10 mg total) by mouth at bedtime. 90 tablet 3   No current facility-administered medications on file prior to visit.    Allergies:  No Known Allergies  Physical Exam Today's Vitals   08/28/24 1053  BP: (!) 148/61  Pulse: 68  Height: 5' 11 (1.803 m)   Body mass index is 17.57 kg/m.  General: Frail malnourished looking elderly seated, in no evident distress.  He is on home oxygen .  Neurologic Exam Mental Status: Awake and fully alert. Oriented to place and time. Recent and remote memory intact. Attention span, concentration and fund of knowledge appropriate. Mood and affect appropriate.  No aphasia.  Mild dysarthria present. Cranial Nerves: Pupils equal, briskly reactive to light. Extraocular movements full without nystagmus. Visual fields full to confrontation. Hearing intact.  Right lower facial weakness.  Sensation intact. Face, tongue, palate moves normally and symmetrically.  Motor: Spastic right hemiplegia with only trace movement in the right shoulder and left hip flexor with adduction.  Normal strength on the left.  Tone is increased on the right. Sensory.: intact to touch ,pinprick .position and vibratory sensation.  Coordination: Rapid alternating movements  normal on left side.  Gait and Station: Deferred as patient nonambulatory Reflexes: 3+ right side, 2+ left side       ASSESSMENT: 73 year old African-American male with left basal ganglia hemorrhage related to anticoagulation with Eliquis  for atrial fibrillation in November 2024 with significant residual spastic right hemiplegia. S/p right femoral artery pseudoaneurysm repair 06/2024.      PLAN:  - Continue working with home health therapy for hopeful ongoing recovery - Continue Eliquis  5 mg twice daily and Crestor  10 mg daily for secondary stroke prevention manage laparoscopic PCP/cardiology - Continue close PCP and cardiology follow-up for aggressive stroke risk factor management    Follow-up in 6 to 9 months with Dr. Rosemarie (per patient request) or call earlier if needed    I personally spent a total of 30 minutes in the care of the patient today including preparing to see the patient, performing a medically appropriate exam/evaluation, counseling and educating, and documenting clinical information in the EHR.  Harlene Bogaert, AGNP-BC  [facility] [address] [place], [name] [address]  Phone [phone] Fax [phone] Note: This document was prepared with digital dictation and possible smart phrase technology. Any transcriptional errors that result from this process are unintentional.

## 2024-08-30 ENCOUNTER — Telehealth: Payer: Self-pay | Admitting: Family Medicine

## 2024-08-30 NOTE — Telephone Encounter (Signed)
 Patient called stating he is needing a new handicap placard. His expired at the end of October, stated he can still barely walk.   Please Advise and call patient when ready for pick up.   Thanks!

## 2024-08-30 NOTE — Telephone Encounter (Signed)
Routed message to PCP. Jacorie Ernsberger, CMA  

## 2024-08-31 ENCOUNTER — Other Ambulatory Visit: Payer: Self-pay | Admitting: Family Medicine

## 2024-08-31 DIAGNOSIS — I10 Essential (primary) hypertension: Secondary | ICD-10-CM

## 2024-08-31 NOTE — Telephone Encounter (Signed)
 Form placed in inbox. Nelson Land, CMA

## 2024-09-01 NOTE — Telephone Encounter (Signed)
Patient's wife called and informed that forms are ready for pick up. Copy made and placed in batch scanning. Original placed at front desk for pick up.   Veronda Prude, RN

## 2024-09-04 NOTE — Telephone Encounter (Signed)
 Patient's wife returns call to nurse line.   She reports issue at [facility] with handicap placard.   Reports that they will not allow processing given that wife's name is not on form and she is not POA for patient.   She is asking if we can update placard form to also include her name given that she is patient's primary caregiver.   Pulled form from batch scanning and placed in PCP box for update.   Chiquita JAYSON English, RN

## 2024-09-05 ENCOUNTER — Telehealth: Payer: Self-pay

## 2024-09-05 NOTE — Telephone Encounter (Signed)
 Leita with [facility] calls nurse line requesting an apt for patient.   She reports she is in the home now for weekly skilled nursing visit. She reports significant signs of upper respiratory infection. She reports cough, mild congestion and green mucous production.   She reports all vital signs are WNL. She reports he is not ill appearing. She denies any shortness of breath or labored breathing.   No fevers.   She reports he needs one day to arrange transportation.   Patient scheduled for 11/13 for evaluation.   Precautions discussed with wife in the meantime for difficulty breathing.

## 2024-09-07 ENCOUNTER — Ambulatory Visit (INDEPENDENT_AMBULATORY_CARE_PROVIDER_SITE_OTHER)

## 2024-09-07 VITALS — BP 123/66 | HR 89 | Temp 97.9°F

## 2024-09-07 DIAGNOSIS — J441 Chronic obstructive pulmonary disease with (acute) exacerbation: Secondary | ICD-10-CM

## 2024-09-07 DIAGNOSIS — E785 Hyperlipidemia, unspecified: Secondary | ICD-10-CM | POA: Diagnosis not present

## 2024-09-07 DIAGNOSIS — E1151 Type 2 diabetes mellitus with diabetic peripheral angiopathy without gangrene: Secondary | ICD-10-CM | POA: Diagnosis not present

## 2024-09-07 LAB — POCT GLYCOSYLATED HEMOGLOBIN (HGB A1C): HbA1c, POC (controlled diabetic range): 5.9 % (ref 0.0–7.0)

## 2024-09-07 MED ORDER — AZITHROMYCIN 250 MG PO TABS: ORAL_TABLET | ORAL | 0 refills | Status: AC

## 2024-09-07 MED ORDER — ROSUVASTATIN CALCIUM 20 MG PO TABS: 20.0000 mg | ORAL_TABLET | Freq: Every day | ORAL | 3 refills | Status: AC

## 2024-09-07 MED ORDER — PREDNISONE 50 MG PO TABS: 50.0000 mg | ORAL_TABLET | Freq: Every day | ORAL | 0 refills | Status: AC

## 2024-09-07 NOTE — Assessment & Plan Note (Signed)
 Well-controlled, A1c 5.8 today taking 5 units of Tresiba  daily and low intensity sliding scale.  No recent urine study, difficult to collect since patient wears depends.  Offered CGM but patient feels current regimen is working and wants to continue with current regimen. -Tresiba  5 units daily -Glucose monitoring 4 times daily

## 2024-09-07 NOTE — Assessment & Plan Note (Signed)
" >>  ASSESSMENT AND PLAN FOR T2DM (TYPE 2 DIABETES MELLITUS) (HCC) WRITTEN ON 09/07/2024  5:52 PM BY Alessio Bogan, MD  Well-controlled, A1c 5.8 today taking 5 units of Tresiba  daily and low intensity sliding scale.  No recent urine study, difficult to collect since patient wears depends.  Offered CGM but patient feels current regimen is working and wants to continue with current regimen. -Tresiba  5 units daily -Glucose monitoring 4 times daily "

## 2024-09-07 NOTE — Progress Notes (Signed)
    SUBJECTIVE:   CHIEF COMPLAINT / HPI:   Patient presents in electric wheelchair, wife Roselind also helps provide much of history  COPD exac: Coughing started 2 weeks ago, with thick green sputum, one episode of nausea after swallowing too much of it. Has been using flutter valve and incentive spirometry. Has been using Albuterol  nebulizer BID which has helped a little bit. Has been compliant with trelegy, baseline oxygen  4L LFNC for last couple years, has not needed more oxygen , no SOB. Discharged from SNF 3-4 weeks ago, no known sick contacts. Denies fevers, chills, emesis, diarrhea, or fatigue. Rhinorrhea for a little bit that has gradually improved, No congestion.  HTN well controlled at home, no concerns.   Hyperlipidemia: Taking rosuvastatin  10 mg every night, no concerns or side effects.  T2DM: A1c and ACR due today. Tresiba  5 U daily with sliding scale through day.  No reported low blood sugars at home, using sliding scale maybe every other day.  PERTINENT  PMH / PSH: Pseudoaneurysm femoral artery, peripheral vascular disease, HFrEF with recovered EF, chronic hypoxic respiratory failure  OBJECTIVE:   BP 123/66   Pulse 89   SpO2 94%    Cardiac: Regular rate and rhythm. Normal S1/S2. No murmurs, rubs, or gallops appreciated. Lungs: Miimal .  Abdomen: Normoactive bowel sounds. No tenderness to deep or light palpation. No rebound or guarding.   Psych: Pleasant and appropriate    ASSESSMENT/PLAN:   Assessment & Plan COPD with acute exacerbation (HCC) Acute onset, suspect exacerbation secondary to viral infection given more mild presentation of symptoms, afebrile, no increased oxygen  demand.  Dyspnea difficult to assess given patient is in wheelchair, increased butyrin production with green thick sputum so we will treat his presumed COPD exacerbation with lower concern for pneumonia.  Offered chest x-ray which patient and wife deferred for now. -Prednisone  50 mg daily for 5  days -Azithromycin  500 mg today, 250 mg for following 4 days -If symptoms do not improve or if spiking fevers, encouraged to return to clinic and we will order chest x-ray -Continue using albuterol  nebulizer as needed and Trelegy daily Type 2 diabetes mellitus with diabetic peripheral angiopathy without gangrene, unspecified whether long term insulin  use (HCC) Well-controlled, A1c 5.8 today taking 5 units of Tresiba  daily and low intensity sliding scale.  No recent urine study, difficult to collect since patient wears depends.  Offered CGM but patient feels current regimen is working and wants to continue with current regimen. -Tresiba  5 units daily -Glucose monitoring 4 times daily Dyslipidemia Given severe PAD and recent complications from pseudoaneurysm and CVA, will increase rosuvastatin  from 10 mg to 20 mg nightly.     Fairy Amy, MD [facility]

## 2024-09-07 NOTE — Assessment & Plan Note (Signed)
 Given severe PAD and recent complications from pseudoaneurysm and CVA, will increase rosuvastatin  from 10 mg to 20 mg nightly.

## 2024-09-07 NOTE — Patient Instructions (Addendum)
 Thank you for visiting the clinic today, it was good to see you!  Please always bring your medication bottles  In today's visit we discussed:  COPD exacerbation: I have sent in a prescription for a steroid and an antibiotic/anti-inflammatory medication azithromycin , you will take these for 5 days.  If your symptoms do not resolve or if you have a fever greater than 100.4, schedule another visit we can get a chest x-ray to evaluate for pneumonia.  Great job controlling her diabetes, your A1c today was 5.8, if you are having low blood sugars in the morning less than 85, we can consider decreasing your insulin  dose.  Cholesterol: I have increased the strength of your rosuvastatin  from 10 mg to 20 mg, if you notice worsening side effects of muscle aching or nausea/vomiting, you can half the tablet and take 10 mg again.  Please follow-up in 2 weeks if needed  For any questions, please call the office at [phone] or send me a message in MyChart. Have a great day!  Fairy Amy, MD  [facility] Resident, PGY-1

## 2024-09-11 ENCOUNTER — Ambulatory Visit (INDEPENDENT_AMBULATORY_CARE_PROVIDER_SITE_OTHER): Admitting: Pulmonary Disease

## 2024-09-11 ENCOUNTER — Encounter (HOSPITAL_BASED_OUTPATIENT_CLINIC_OR_DEPARTMENT_OTHER): Payer: Self-pay | Admitting: Pulmonary Disease

## 2024-09-11 VITALS — BP 155/61 | HR 68 | Ht 71.0 in | Wt 160.6 lb

## 2024-09-11 DIAGNOSIS — J4489 Other specified chronic obstructive pulmonary disease: Secondary | ICD-10-CM

## 2024-09-11 DIAGNOSIS — J9611 Chronic respiratory failure with hypoxia: Secondary | ICD-10-CM | POA: Diagnosis not present

## 2024-09-11 MED ORDER — TRELEGY ELLIPTA 100-62.5-25 MCG/ACT IN AEPB: 100.0000 ug | INHALATION_SPRAY | Freq: Every day | RESPIRATORY_TRACT | 3 refills | Status: AC

## 2024-09-11 NOTE — Patient Instructions (Addendum)
 X refills on trelegy    VISIT SUMMARY: Today, you were seen for an evaluation of your chronic respiratory failure and hypoxia. We discussed your current oxygen  use, management of your COPD, and your recent increase in coughing. We also reviewed your physical therapy for right-sided hemiparesis and addressed your occasional difficulties with swallowing.  YOUR PLAN: -CHRONIC RESPIRATORY FAILURE WITH HYPOXIA: Chronic respiratory failure with hypoxia means your lungs are not getting enough oxygen  into your blood. Your oxygen  levels were checked and improved with supplemental oxygen .  -CHRONIC OBSTRUCTIVE PULMONARY DISEASE (COPD): COPD is a chronic lung disease that makes it hard to breathe. You should continue using Trelegy daily and your albuterol  nebulizer as needed for coughing. Your prescription for Trelegy has been refilled.   -DYSPHAGIA: Dysphagia means difficulty swallowing. You should be cautious with your food intake to prevent choking.                        Contains text generated by [employer]bridge.                                 Contains text generated by [employer]bridge.

## 2024-09-11 NOTE — Progress Notes (Signed)
 Subjective:    Patient ID: Kyle [name], male    DOB: [birth year], 73 y.o.   MRN: [id number]   73 yo ex smoker for follow-up of emphysema and chronic respiratory failure on oxygen    PMH - cardiomyopathy, HFrEF Mod AS PAD s/p right femoral peroneal bypass  LT BG hemorrhagic CVA 2024 Quit 2019, 40 Pyrs   Discussed the use of AI scribe software for clinical note transcription with the patient, who gave verbal consent to proceed.  History of Present Illness Kyle [name] is a 73 year old male with chronic respiratory failure who presents for evaluation of hypoxia. He is accompanied by his caregiver, who assists with his daily activities and medical care.  He previously required 4-5 liters of oxygen  while ambulating, but his oxygen  use has been reduced. He uses Trelegy and an albuterol  nebulizer as needed, especially during episodes of increased coughing. He had COVID-19 in February, which may have contributed to weight loss. His current weight is difficult to assess accurately due to the wheelchair, but he was last recorded at 129 pounds. He occasionally experiences dysphagia, particularly with liquids, requiring careful monitoring during meals to prevent choking. He has received his flu and COVID vaccinations, but there is uncertainty about his pneumococcal vaccination status.  Right-sided hemiparesis secondary to stroke last year. Currently using a wheelchair for mobility. Physical therapy provided weekly for leg and arm strengthening. - Continue weekly physical therapy sessions for leg and arm strengthening.   Reviewed prior dc summaries, last echo, imaging  Significant tests/ events reviewed   06/2024 ABG 7.36/45/98 vs 7.31/49/133 CTA chest 06/2024 bibasilar consolidation, possibly due to aspiration ,  Confluent pathologic mediastinal and bilateral hilar adenopathy, minimally enlarged since prior examination of 01/05/2024 LDCT 11/25/22 >> new RUL nodules 6mm & 4.5 mm, new LUL nodule  8mm LDCT 04/2021 RADS2 - RLL nodule unchanged   LDCT 02/2020 - lung RADS 2.  Moderate to advanced changes of emphysema.  And tiny nodule in the left base at 4.7 mm.   11/20/2018-CT head without contrast- sinuses normal   12/16/2018-spirometry- FVC 1.7 (43% predicted), ratio 64, FEV1 1.1 (36% predicted), consistent with severe airway obstruction     03/2021 echo LVEF 45 to 50%, RVSP 55    Vitals:   09/11/24 1138 09/11/24 1212 09/11/24 1214  BP: (!) 155/61    Pulse: 68    Height: 5' 11 (1.803 m)    Weight: 160 lb 9.6 oz (72.8 kg)    SpO2: 92% Comment: on 4L POC at rest (!) 86% Comment: on room at rest 93% Comment: on 4 L POC at rest pt is unable to walk  BMI (Calculated): 22.41       Review of Systems  neg for any significant sore throat, dysphagia, itching, sneezing, nasal congestion or excess/ purulent secretions, fever, chills, sweats, unintended wt loss, pleuritic or exertional cp, hempoptysis, orthopnea pnd or change in chronic leg swelling. Also denies presyncope, palpitations, heartburn, abdominal pain, nausea, vomiting, diarrhea or change in bowel or urinary habits, dysuria,hematuria, rash, arthralgias, visual complaints, headache, numbness weakness or ataxia.      Objective:   Physical Exam  Gen. Pleasant, thin man, WC bound in no distress ENT - no thrush, no pallor/icterus,no post nasal drip Neck: No JVD, no thyromegaly, no carotid bruits Lungs: no use of accessory muscles, no dullness to percussion,decreased BL without rales or rhonchi  Cardiovascular: Rhythm regular, heart sounds  normal, no murmurs or gallops, no peripheral edema Musculoskeletal: No deformities,  no cyanosis or clubbing  RT hemiparesis, good power on LT       Assessment & Plan:   Assessment and Plan Assessment & Plan Chronic respiratory failure with hypoxia Saturation at 86% on room air, improved to 92% with 2 liters of oxygen . Unable to walk due to hemiparesis, complicating requalification  for oxygen  therapy. - Rechecked oxygen  levels, especially nocturnal levels, to assess need for oxygen  therapy certification.  Chronic obstructive pulmonary disease (COPD) COPD managed with Trelegy and albuterol  nebulizer as needed. Recent increase in coughing managed with albuterol  nebulizer. - Continue Trelegy and albuterol  nebulizer as needed. - Refilled prescription for Trelegy.     Dysphagia Occasional choking on food and water . Advised to be cautious with food intake. - Advised caution with food intake to prevent choking.

## 2024-09-12 ENCOUNTER — Ambulatory Visit

## 2024-09-13 ENCOUNTER — Other Ambulatory Visit: Payer: Self-pay | Admitting: Family Medicine

## 2024-09-13 DIAGNOSIS — Z794 Long term (current) use of insulin: Secondary | ICD-10-CM | POA: Diagnosis not present

## 2024-09-13 DIAGNOSIS — E118 Type 2 diabetes mellitus with unspecified complications: Secondary | ICD-10-CM | POA: Diagnosis not present

## 2024-09-13 MED ORDER — GABAPENTIN 600 MG PO TABS: 600.0000 mg | ORAL_TABLET | Freq: Every day | ORAL | 0 refills | Status: DC

## 2024-09-14 LAB — MICROALBUMIN / CREATININE URINE RATIO
Creatinine, Urine: 128.5 mg/dL
Microalb/Creat Ratio: 21 mg/g{creat} (ref 0–29)
Microalbumin, Urine: 27.5 ug/mL

## 2024-09-15 ENCOUNTER — Ambulatory Visit: Payer: Self-pay | Admitting: Family Medicine

## 2024-09-18 ENCOUNTER — Ambulatory Visit

## 2024-09-18 VITALS — Ht 71.0 in | Wt 153.0 lb

## 2024-09-18 DIAGNOSIS — Z0000 Encounter for general adult medical examination without abnormal findings: Secondary | ICD-10-CM

## 2024-09-18 NOTE — Progress Notes (Signed)
 I connected with  [name] [name] on 09/18/24 by a audio enabled telemedicine application and verified that I am speaking with the correct person using two identifiers.  Patient Location: Home  Provider Location: Home Office  Persons Participating in Visit: Patient assisted by wife.  I discussed the limitations of evaluation and management by telemedicine. The patient expressed understanding and agreed to proceed.   Vital Signs: Because this visit was a virtual/telehealth visit, some criteria may be missing or patient reported. Any vitals not documented were not able to be obtained and vitals that have been documented are patient reported.     Chief Complaint  Patient presents with   Medicare Wellness    SUBSEQUENT     Subjective:   PEIGHTON EDGIN is a 73 y.o. male who presents for a Medicare Annual Wellness Visit.  Allergies (verified) Patient has no known allergies.   History: Past Medical History:  Diagnosis Date   Acute hypoxic on chronic hypercapnic respiratory failure (HCC) 01/05/2024   Acute on chronic respiratory failure with hypoxia (HCC) 12/04/2023   Aortic insufficiency    MODERATE WITH A BICUSPID AORTIC VAVLE   Aortic stenosis 09/12/2014   Moderate with AVA 1.3 cm, pk 30 mmHg, echo Oct 2015     Aortoiliac occlusive disease (HCC) 12/16/2014   Arterial occlusive disease    MULTILEVEL   Atherosclerosis of artery of extremity with rest pain (HCC) 04/21/2019   Atrial fibrillation (HCC)    CHF (congestive heart failure) (HCC)    Chronic bronchitis (HCC)    CKD (chronic kidney disease) stage 3, GFR 30-59 ml/min (HCC)    COPD (chronic obstructive pulmonary disease) (HCC)    Dilated cardiomyopathy (HCC)    WITH EJECTION FRACTION DOWN TO 20-25%--WITH CONGESTIVE HEART FAILURE   Edema    LOWER EXTREMETIES   Emphysema of lung (HCC)    Hemorrhagic stroke (HCC) 09/06/2023   right side defecit   Hypertension    ICH (intracerebral hemorrhage) (HCC) 09/10/2023    Insulin  dependent type 2 diabetes mellitus (HCC)    Normal coronary arteries 2009   Orthopnea    Peripheral arterial disease    Pulmonary hypertension (HCC)    Stroke [facility])    Past Surgical History:  Procedure Laterality Date   ABDOMINAL AORTAGRAM N/A 11/05/2014   Procedure: ABDOMINAL EZELLA;  Surgeon: Lonni GORMAN Blade, MD;  Location: [facility] CATH LAB;  Service: Cardiovascular;  Laterality: N/A;   ABDOMINAL AORTOGRAM W/LOWER EXTREMITY Bilateral 04/21/2019   Procedure: ABDOMINAL AORTOGRAM W/LOWER EXTREMITY;  Surgeon: Blade Lonni GORMAN, MD;  Location: [facility] INVASIVE CV LAB;  Service: Cardiovascular;  Laterality: Bilateral;   ABDOMINAL AORTOGRAM W/LOWER EXTREMITY Right 05/30/2021   Procedure: ABDOMINAL AORTOGRAM W/LOWER EXTREMITY;  Surgeon: Blade Lonni GORMAN, MD;  Location: [facility] INVASIVE CV LAB;  Service: Cardiovascular;  Laterality: Right;   AORTA - BILATERAL FEMORAL ARTERY BYPASS GRAFT N/A 12/18/2014   Procedure: AORTOBIFEMORAL BYPASS GRAFT;  Surgeon: Lonni GORMAN Blade, MD;  Location: [facility] OR;  Service: Vascular;  Laterality: N/A;   APPLICATION OF WOUND VAC Right 07/17/2024   Procedure: APPLICATION, WOUND VAC TO RIGHT GROIN;  Surgeon: Gretta Lonni PARAS, MD;  Location: [facility] OR;  Service: Vascular;  Laterality: Right;   BYPASS GRAFT FEMORAL-PERONEAL Right 07/17/2024   Procedure: REVISION OF RIGHT BYPASS ARTERIAL FEMORAL TO PERONEAL;  Surgeon: Gretta Lonni PARAS, MD;  Location: [facility] OR;  Service: Vascular;  Laterality: Right;   CARDIAC CATHETERIZATION  2009   Nl Cors, EF 20%   COLONOSCOPY  ENDARTERECTOMY FEMORAL Left 12/18/2014   Procedure: ENDARTERECTOMY FEMORAL;  Surgeon: Lonni GORMAN Blade, MD;  Location: [facility] OR;  Service: Vascular;  Laterality: Left;   FALSE ANEURYSM REPAIR Right 04/24/2019   Procedure: REPAIR OF RIGHT FEMORAL ARTERY PSEUDOANEURYSM;  Surgeon: Oris Krystal FALCON, MD;  Location: [facility] OR;  Service: Vascular;  Laterality: Right;   FALSE ANEURYSM REPAIR Right 07/17/2024    Procedure: REPAIR OF RIGHT PSEUDOANEURYSM;  Surgeon: Gretta Lonni PARAS, MD;  Location: [facility] OR;  Service: Vascular;  Laterality: Right;  R GROIN RECONSTRUCTION WITH MUSCLE FLAP   FEMORAL-POPLITEAL BYPASS GRAFT  02/21/2011   right   FEMORAL-POPLITEAL BYPASS GRAFT Right 07/17/2024   Procedure: BYPASS GRAFT COMMON FEMORAL TO PROFUNDA BYPASS USING HEMASHEILD GOLD X 30CM;  Surgeon: Gretta Lonni PARAS, MD;  Location[facility] OR;  Service: Vascular;  Laterality: Right;   FEMORAL-TIBIAL BYPASS GRAFT Right 04/24/2019   Procedure: REDO BYPASS GRAFT RIGHT FEMORAL-TIBIAL ARTERY USING LEFT LEG VEIN & HEMASHIELD GOLD 8mm GRAFT;  Surgeon: Oris Krystal FALCON, MD;  Location: [facility] OR;  Service: Vascular;  Laterality: Right;   IR FLUORO GUIDE CV LINE RIGHT  12/28/2019   IR REMOVAL TUN CV CATH W/O FL  01/11/2020   IR US  GUIDE VASC ACCESS RIGHT  12/28/2019   MUSCLE FLAP CLOSURE Right 07/17/2024   Procedure: SARTORIOUS MUSCLE FLAP CLOSURE OF RIGHT GROIN;  Surgeon: Gretta Lonni PARAS, MD;  Location: [facility] OR;  Service: Vascular;  Laterality: Right;  R GROIN RECONSTRUCTION WITH MUSCLE FLAP   OTHER SURGICAL HISTORY     POST RIGHT FEMOROPOPLITEAL BYPASS GRAFT   PERIPHERAL VASCULAR CATHETERIZATION  11/05/2014   Procedure: LOWER EXTREMITY ANGIOGRAPHY;  Surgeon: Lonni GORMAN Blade, MD;  Location: [facility] CATH LAB;  Service: Cardiovascular;;   VEIN HARVEST Left 04/24/2019   Procedure: LEFT LEG SAPHENOUS VEIN HARVEST;  Surgeon: Oris Krystal FALCON, MD;  Location: [facility] OR;  Service: Vascular;  Laterality: Left;   Family History  Problem Relation Age of Onset   Diabetes Mother    Hyperlipidemia Sister    Hypertension Sister    Social History   Occupational History   Occupation: retired    Associate Professor: [employer]  Tobacco Use   Smoking status: Former    Current packs/day: 0.00    Average packs/day: 1 pack/day for 40.0 years (40.0 ttl pk-yrs)    Types: Cigarettes    Start date: 10/15/1978    Quit date: 10/15/2018    Years since quitting:  5.9    Passive exposure: Never   Smokeless tobacco: Never   Tobacco comments:    quit 2010 and quit again 2019  Vaping Use   Vaping status: Never Used  Substance and Sexual Activity   Alcohol use: No    Alcohol/week: 0.0 standard drinks of alcohol   Drug use: No   Sexual activity: Not on file   Tobacco Counseling Counseling given: Not Answered Tobacco comments: quit 2010 and quit again 2019  SDOH Screenings   Food Insecurity: Food Insecurity Present (09/18/2024)  Housing: Low Risk  (09/18/2024)  Transportation Needs: No Transportation Needs (09/18/2024)  Utilities: Not At Risk (09/18/2024)  Alcohol Screen: Low Risk  (03/05/2023)  Depression (PHQ2-9): Low Risk  (09/18/2024)  Financial Resource Strain: Medium Risk (08/17/2024)  Physical Activity: Inactive (09/18/2024)  Social Connections: Moderately Isolated (09/18/2024)  Stress: No Stress Concern Present (09/18/2024)  Tobacco Use: Medium Risk (09/18/2024)  Health Literacy: Adequate Health Literacy (09/18/2024)   See flowsheets for full screening details  Depression Screen Depression Screening Exception Documentation Depression  Screening Exception:: Patient refusal  PHQ 2 & 9 Depression Scale- Over the past 2 weeks, how often have you been bothered by any of the following problems? Little interest or pleasure in doing things: 0 Feeling down, depressed, or hopeless (PHQ Adolescent also includes...irritable): 0 PHQ-2 Total Score: 0 Trouble falling or staying asleep, or sleeping too much: 0 Feeling tired or having little energy: 0 Poor appetite or overeating (PHQ Adolescent also includes...weight loss): 0 Feeling bad about yourself - or that you are a failure or have let yourself or your family down: 0 Trouble concentrating on things, such as reading the newspaper or watching television (PHQ Adolescent also includes...like school work): 0 Moving or speaking so slowly that other people could have noticed. Or the opposite -  being so fidgety or restless that you have been moving around a lot more than usual: 0 Thoughts that you would be better off dead, or of hurting yourself in some way: 0 PHQ-9 Total Score: 0 If you checked off any problems, how difficult have these problems made it for you to do your work, take care of things at home, or get along with other people?: Not difficult at all  Depression Treatment Depression Interventions/Treatment : EYV7-0 Score <4 Follow-up Not Indicated     Goals Addressed             This Visit's Progress    09/18/24: To maintain my health the best that I can.         Visit info / Clinical Intake: Medicare Wellness Visit Type:: Subsequent Annual Wellness Visit Persons participating in visit:: patient & caregiver (WIFE) Medicare Wellness Visit Mode:: Telephone If telephone:: video declined Because this visit was a virtual/telehealth visit:: pt reported vitals If Telephone or Video please confirm:: I connected with the patient using audio enabled telemedicine application and verified that I am speaking with the correct person using two identifiers; I discussed the limitations of evaluation and management by telemedicine; The patient expressed understanding and agreed to proceed Patient Location:: HOME Provider Location:: HOME OFFICE Information given by:: patient; caregiver (WIFE) Interpreter Needed?: No Pre-visit prep was completed: yes AWV questionnaire completed by patient prior to visit?: no Living arrangements:: lives with spouse/significant other Patient's Overall Health Status Rating: good Typical amount of pain: none Does pain affect daily life?: no Are you currently prescribed opioids?: no  Dietary Habits and Nutritional Risks How many meals a day?: 3 Eats fruit and vegetables daily?: yes Most meals are obtained by: preparing own meals (WIFE PREPARES FOOD) In the last 2 weeks, have you had any of the following?: none Diabetic:: (!) yes Any  non-healing wounds?: no How often do you check your BS?: 3 Would you like to be referred to a Nutritionist or for Diabetic Management? : no  Functional Status Activities of Daily Living (to include ambulation/medication): Independent Feeding: Independent Dressing/Grooming: Needs assistance Bathing: Needs assistance Toileting: Needs assistance Transfer: Needs assistance Ambulation: Independent with device- listed below Home Assistive Devices/Equipment: Eyeglasses; Wheelchair; Oxygen  Medication Administration: Independent Home Management: Needs assistance (comment) Manage your own finances?: yes Primary transportation is: driving; family/friends (WIFE DRIVES PATIENT TO HIS APPOINTMENTS) Concerns about vision?: no *vision screening is required for WTM* Concerns about hearing?: no  Fall Screening Falls in the past year?: 0 Number of falls in past year: 0 Was there an injury with Fall?: 0 Fall Risk Category Calculator: 0 Patient Fall Risk Level: Low Fall Risk  Fall Risk Patient at Risk for Falls Due to: No Fall Risks;  Impaired mobility; Impaired balance/gait Fall risk Follow up: Falls evaluation completed; Education provided  Home and Transportation Safety: All rugs have non-skid backing?: N/A, no rugs All stairs or steps have railings?: N/A, no stairs Grab bars in the bathtub or shower?: (!) no Have non-skid surface in bathtub or shower?: yes Good home lighting?: yes Regular seat belt use?: yes Hospital stays in the last year:: (!) yes How many hospital stays:: 1 Reason: Pseudoaneurysm of femoral artery  Cognitive Assessment Difficulty concentrating, remembering, or making decisions? : no Will 6CIT or Mini Cog be Completed: no 6CIT or Mini Cog Declined: patient alert, oriented, able to answer questions appropriately and recall recent events  Advance Directives (For Healthcare) Does Patient Have a Medical Advance Directive?: No Would patient like information on creating a  medical advance directive?: No - Patient declined  Reviewed/Updated  Reviewed/Updated: Reviewed All (Medical, Surgical, Family, Medications, Allergies, Care Teams, Patient Goals)        Objective:    Today's Vitals   09/18/24 1153  Weight: 153 lb (69.4 kg)  Height: 5' 11 (1.803 m)  PainSc: 0-No pain   Body mass index is 21.34 kg/m.  Current Medications (verified) Outpatient Encounter Medications as of 09/18/2024  Medication Sig   Accu-Chek FastClix Lancets MISC Inject 102 each into the skin 3 (three) times daily.   ACCU-CHEK GUIDE TEST test strip 1 each by Other route 3 (three) times daily.   acetaminophen  (TYLENOL ) 325 MG tablet Take 2 tablets (650 mg total) by mouth every 4 (four) hours as needed for mild pain (pain score 1-3).   albuterol  (PROVENTIL ) (2.5 MG/3ML) 0.083% nebulizer solution Take 3 mLs (2.5 mg total) by nebulization every 4 (four) hours as needed for wheezing or shortness of breath.   amLODipine  (NORVASC ) 5 MG tablet Take 5 mg by mouth at bedtime.   apixaban  (ELIQUIS ) 5 MG TABS tablet Take 1 tablet (5 mg total) by mouth 2 (two) times daily.   azithromycin  (ZITHROMAX ) 250 MG tablet Take 2 tabs by mouth on day 1, followed by one tab by mouth daily on days 2-5   carvedilol  (COREG ) 12.5 MG tablet Take 1 tablet (12.5 mg total) by mouth 2 (two) times daily with a meal.   DROPLET PEN NEEDLES 32G X 4 MM MISC USE AS DIRECTED WITH INSULIN    Fluticasone -Umeclidin-Vilant (TRELEGY ELLIPTA ) 100-62.5-25 MCG/ACT AEPB Inhale 100 mcg into the lungs daily.   gabapentin  (NEURONTIN ) 600 MG tablet Take 1 tablet (600 mg total) by mouth at bedtime.   Insulin  Degludec (TRESIBA ) 100 UNIT/ML SOLN Inject 5 Units into the skin daily.   levETIRAcetam  (KEPPRA ) 500 MG tablet Take 1 tablet (500 mg total) by mouth 2 (two) times daily.   mirtazapine  (REMERON ) 7.5 MG tablet Take 1 tablet (7.5 mg total) by mouth at bedtime.   MUCINEX  600 MG 12 hr tablet Take 600 mg by mouth 2 (two) times daily.    Multiple Vitamins-Minerals (CENTRUM MEN) TABS Take 1 tablet by mouth daily with breakfast.   NOVOLOG  FLEXPEN 100 UNIT/ML FlexPen Inject 0-10 Units into the skin See admin instructions. Inject 0-10 units into the skin three times a day before meals, per Sliding Scale: BGL 0-59 OR 400-1,000 = CALL MD; 60-150 = give nothing; 151-199 = 2 units; 200-249 = 4 units; 250-299 = 6 units; 300-349 = 8 units; 350-399 = 10 units   OXYGEN  Inhale 4 L/min into the lungs continuous.   pantoprazole  (PROTONIX ) 40 MG tablet Take 1 tablet (40 mg total) by mouth daily.  polyethylene glycol (MIRALAX  / GLYCOLAX ) 17 g packet Take 17 g by mouth 2 (two) times daily as needed for mild constipation or moderate constipation.   predniSONE  (DELTASONE ) 50 MG tablet Take 1 tablet (50 mg total) by mouth daily with breakfast. For 5 days   rosuvastatin  (CRESTOR ) 20 MG tablet Take 1 tablet (20 mg total) by mouth daily.   No facility-administered encounter medications on file as of 09/18/2024.   Hearing/Vision screen Hearing Screening - Comments:: Denies hearing difficulties.  Vision Screening - Comments:: No eyeglasses.  Eye exam completed by [facility]. Immunizations and Health Maintenance Health Maintenance  Topic Date Due   Zoster Vaccines- Shingrix (1 of 2) Never done   Colonoscopy  Never done   FOOT EXAM  07/04/2023   OPHTHALMOLOGY EXAM  09/02/2024   COVID-19 Vaccine (6 - Moderna risk 2025-26 season) 02/16/2025   HEMOGLOBIN A1C  03/07/2025   Lung Cancer Screening  07/19/2025   Diabetic kidney evaluation - eGFR measurement  07/25/2025   Diabetic kidney evaluation - Urine ACR  09/13/2025   Medicare Annual Wellness (AWV)  09/18/2025   DTaP/Tdap/Td (2 - Td or Tdap) 04/11/2034   Pneumococcal Vaccine: 50+ Years  Completed   Influenza Vaccine  Completed   Hepatitis C Screening  Completed   Meningococcal B Vaccine  Aged Out        Assessment/Plan:  This is a routine wellness examination for Jameir.  Patient  Care Team: Adele Song, MD as PCP - General (Family Medicine) Jordan, Peter M, MD as PCP - Cardiology (Cardiology) Jude Harden GAILS, MD as Consulting Physician (Pulmonary Disease) [facility], [place]  I have personally reviewed and noted the following in the patient's chart:   Medical and social history Use of alcohol, tobacco or illicit drugs  Current medications and supplements including opioid prescriptions. Functional ability and status Nutritional status Physical activity Advanced directives List of other physicians Hospitalizations, surgeries, and ER visits in previous 12 months Vitals Screenings to include cognitive, depression, and falls Referrals and appointments  No orders of the defined types were placed in this encounter.  In addition, I have reviewed and discussed with patient certain preventive protocols, quality metrics, and best practice recommendations. A written personalized care plan for preventive services as well as general preventive health recommendations were provided to patient.   Roz LOISE Fuller, LPN   [id number]/[id number]   Return in 1 year (on 09/18/2025).  After Visit Summary: (MyChart) Due to this being a telephonic visit, the after visit summary with patients personalized plan was offered to patient via MyChart   Nurse Notes: Patient aware of current care gaps.

## 2024-09-18 NOTE — Patient Instructions (Signed)
 [name],  Thank you for taking the time for your Medicare Wellness Visit. I appreciate your continued commitment to your health goals. Please review the care plan we discussed, and feel free to reach out if I can assist you further.  Please note that Annual Wellness Visits do not include a physical exam. Some assessments may be limited, especially if the visit was conducted virtually. If needed, we may recommend an in-person follow-up with your provider.  Ongoing Care Seeing your primary care provider every 3 to 6 months helps us  monitor your health and provide consistent, personalized care.   Referrals If a referral was made during today's visit and you haven't received any updates within two weeks, please contact the referred provider directly to check on the status.  Recommended Screenings:  Health Maintenance  Topic Date Due   Zoster (Shingles) Vaccine (1 of 2) Never done   Colon Cancer Screening  Never done   Complete foot exam   07/04/2023   Medicare Annual Wellness Visit  03/04/2024   Eye exam for diabetics  09/02/2024   COVID-19 Vaccine (6 - Moderna risk 2025-26 season) 02/16/2025   Hemoglobin A1C  03/07/2025   Screening for Lung Cancer  07/19/2025   Yearly kidney function blood test for diabetes  07/25/2025   Yearly kidney health urinalysis for diabetes  09/13/2025   DTaP/Tdap/Td vaccine (2 - Td or Tdap) 04/11/2034   Pneumococcal Vaccine for age over 66  Completed   Flu Shot  Completed   Hepatitis C Screening  Completed   Meningitis B Vaccine  Aged Out       09/18/2024   11:55 AM  Advanced Directives  Does Patient Have a Medical Advance Directive? No  Would patient like information on creating a medical advance directive? No - Patient declined    Vision: Annual vision screenings are recommended for early detection of glaucoma, cataracts, and diabetic retinopathy. These exams can also reveal signs of chronic conditions such as diabetes and high blood  pressure.  Dental: Annual dental screenings help detect early signs of oral cancer, gum disease, and other conditions linked to overall health, including heart disease and diabetes.  Please see the attached documents for additional preventive care recommendations.

## 2024-09-19 ENCOUNTER — Other Ambulatory Visit: Payer: Self-pay

## 2024-09-19 MED ORDER — PANTOPRAZOLE SODIUM 40 MG PO TBEC: 40.0000 mg | DELAYED_RELEASE_TABLET | Freq: Every day | ORAL | 0 refills | Status: DC

## 2024-09-26 ENCOUNTER — Other Ambulatory Visit (HOSPITAL_COMMUNITY): Payer: Self-pay

## 2024-09-26 ENCOUNTER — Telehealth: Payer: Self-pay

## 2024-09-26 ENCOUNTER — Ambulatory Visit

## 2024-09-26 ENCOUNTER — Other Ambulatory Visit: Payer: Self-pay | Admitting: Cardiology

## 2024-09-26 DIAGNOSIS — I482 Chronic atrial fibrillation, unspecified: Secondary | ICD-10-CM

## 2024-09-26 MED ORDER — APIXABAN 5 MG PO TABS
5.0000 mg | ORAL_TABLET | Freq: Two times a day (BID) | ORAL | 2 refills | Status: AC
  Filled 2024-09-26: qty 180, 90d supply, fill #0

## 2024-09-26 NOTE — Telephone Encounter (Signed)
 Eliquis  5mg  refill request received. Patient is 73 years old, weight-69.4kg, Crea-1.35 on 07/25/24, Diagnosis-Afib, and last seen by Dr. Jordan on 06/23/24. Dose is appropriate based on dosing criteria. Will send in refill to requested pharmacy.

## 2024-09-27 ENCOUNTER — Other Ambulatory Visit (HOSPITAL_COMMUNITY): Payer: Self-pay

## 2024-10-03 ENCOUNTER — Ambulatory Visit: Attending: Vascular Surgery

## 2024-10-03 VITALS — BP 152/77 | HR 85 | Temp 98.0°F

## 2024-10-03 DIAGNOSIS — I724 Aneurysm of artery of lower extremity: Secondary | ICD-10-CM

## 2024-10-03 DIAGNOSIS — Z95828 Presence of other vascular implants and grafts: Secondary | ICD-10-CM

## 2024-10-03 DIAGNOSIS — S31109D Unspecified open wound of abdominal wall, unspecified quadrant without penetration into peritoneal cavity, subsequent encounter: Secondary | ICD-10-CM

## 2024-10-03 DIAGNOSIS — Z9889 Other specified postprocedural states: Secondary | ICD-10-CM

## 2024-10-03 NOTE — Progress Notes (Signed)
 Office Note     CC:  follow up Requesting Provider:  Adele Song, MD  HPI: Kyle [name] is a 73 y.o. (April 30, 1951) male who presents for follow up wound check. He is s/p 1) repair of right common femoral pseudoaneurysm, 2) right common femoral to profunda bypass with dacryon, 3) revision of right common femoral to peroneal vein graft with new proximal anastomosis to dacryon interposition, 4) right lower extremity thrombectomy, 5) sartorius muscle flap, 6) right groin wound VAC placement on 07/17/24 by Dr. Gretta.   He was seen several weeks ago and he had transitioned from the [facility] to a wet to dry dressing once he left the SNF. His wife was assisting with wound management. He overall was otherwise doing well. Right leg remained well perfused an warm with brisk peroneal signal  Today he is here with his wife. He reports that his right foot and right thigh are very tender to the touch. Uncomfortable when trying to move his leg or transfer. Otherwise he denies any pain. They feel the groin has healed up well. 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 He does not ambulate and is in rollator.  He is medically managed on Eliquis  and statin.  Past Medical History:  Diagnosis Date   Acute hypoxic on chronic hypercapnic respiratory failure (HCC) 01/05/2024   Acute on chronic respiratory failure with hypoxia (HCC) 12/04/2023   Aortic insufficiency    MODERATE WITH A BICUSPID AORTIC VAVLE   Aortic stenosis 09/12/2014   Moderate with AVA 1.3 cm, pk 30 mmHg, echo Oct 2015     Aortoiliac occlusive disease (HCC) 12/16/2014   Arterial occlusive disease    MULTILEVEL   Atherosclerosis of artery of extremity with rest pain (HCC) 04/21/2019   Atrial fibrillation (HCC)    CHF (congestive heart failure) (HCC)    Chronic bronchitis (HCC)    CKD (chronic kidney disease) stage 3, GFR 30-59 ml/min (HCC)    COPD (chronic obstructive pulmonary disease) (HCC)    Dilated  cardiomyopathy (HCC)    WITH EJECTION FRACTION DOWN TO 20-25%--WITH CONGESTIVE HEART FAILURE   Edema    LOWER EXTREMETIES   Emphysema of lung (HCC)    Hemorrhagic stroke (HCC) 09/06/2023   right side defecit   Hypertension    ICH (intracerebral hemorrhage) (HCC) 09/10/2023   Insulin  dependent type 2 diabetes mellitus (HCC)    Normal coronary arteries 2009   Orthopnea    Peripheral arterial disease    Pulmonary hypertension (HCC)    Stroke [facility])     Past Surgical History:  Procedure Laterality Date   ABDOMINAL AORTAGRAM N/A 11/05/2014   Procedure: ABDOMINAL EZELLA;  Surgeon: Lonni GORMAN Blade, MD;  Location: [facility] CATH LAB;  Service: Cardiovascular;  Laterality: N/A;   ABDOMINAL AORTOGRAM W/LOWER EXTREMITY Bilateral 04/21/2019   Procedure: ABDOMINAL AORTOGRAM W/LOWER EXTREMITY;  Surgeon: Blade Lonni GORMAN, MD;  Location: [facility] INVASIVE CV LAB;  Service: Cardiovascular;  Laterality: Bilateral;   ABDOMINAL AORTOGRAM W/LOWER EXTREMITY Right 05/30/2021   Procedure: ABDOMINAL AORTOGRAM W/LOWER EXTREMITY;  Surgeon: Blade Lonni GORMAN, MD;  Location: [facility] INVASIVE CV LAB;  Service: Cardiovascular;  Laterality: Right;   AORTA - BILATERAL FEMORAL ARTERY BYPASS GRAFT N/A 12/18/2014   Procedure: AORTOBIFEMORAL BYPASS GRAFT;  Surgeon: Lonni GORMAN Blade, MD;  Location: [facility] OR;  Service: Vascular;  Laterality: N/A;   APPLICATION OF WOUND VAC Right 07/17/2024   Procedure: APPLICATION, WOUND VAC TO RIGHT GROIN;  Surgeon: Gretta Lonni PARAS,  MD;  Location: [facility] OR;  Service: Vascular;  Laterality: Right;   BYPASS GRAFT FEMORAL-PERONEAL Right 07/17/2024   Procedure: REVISION OF RIGHT BYPASS ARTERIAL FEMORAL TO PERONEAL;  Surgeon: Gretta Lonni PARAS, MD;  Location: [facility] OR;  Service: Vascular;  Laterality: Right;   CARDIAC CATHETERIZATION  2009   Nl Cors, EF 20%   COLONOSCOPY     ENDARTERECTOMY FEMORAL Left 12/18/2014   Procedure: ENDARTERECTOMY FEMORAL;  Surgeon: Lonni GORMAN Blade, MD;   Location[facility];  Service: Vascular;  Laterality: Left;   FALSE ANEURYSM REPAIR Right 04/24/2019   Procedure: REPAIR OF RIGHT FEMORAL ARTERY PSEUDOANEURYSM;  Surgeon: Oris Krystal FALCON, MD;  Location: [facility] OR;  Service: Vascular;  Laterality: Right;   FALSE ANEURYSM REPAIR Right 07/17/2024   Procedure: REPAIR OF RIGHT PSEUDOANEURYSM;  Surgeon: Gretta Lonni PARAS, MD;  Location: [facility] OR;  Service: Vascular;  Laterality: Right;  R GROIN RECONSTRUCTION WITH MUSCLE FLAP   FEMORAL-POPLITEAL BYPASS GRAFT  02/21/2011   right   FEMORAL-POPLITEAL BYPASS GRAFT Right 07/17/2024   Procedure: BYPASS GRAFT COMMON FEMORAL TO PROFUNDA BYPASS USING HEMASHEILD GOLD X 30CM;  Surgeon: Gretta Lonni PARAS, MD;  Location: [facility] OR;  Service: Vascular;  Laterality: Right;   FEMORAL-TIBIAL BYPASS GRAFT Right 04/24/2019   Procedure: REDO BYPASS GRAFT RIGHT FEMORAL-TIBIAL ARTERY USING LEFT LEG VEIN & HEMASHIELD GOLD 8mm GRAFT;  Surgeon: Oris Krystal FALCON, MD;  Location: [facility] OR;  Service: Vascular;  Laterality: Right;   IR FLUORO GUIDE CV LINE RIGHT  12/28/2019   IR REMOVAL TUN CV CATH W/O FL  01/11/2020   IR US  GUIDE VASC ACCESS RIGHT  12/28/2019   MUSCLE FLAP CLOSURE Right 07/17/2024   Procedure: SARTORIOUS MUSCLE FLAP CLOSURE OF RIGHT GROIN;  Surgeon: Gretta Lonni PARAS, MD;  Location: [facility] OR;  Service: Vascular;  Laterality: Right;  R GROIN RECONSTRUCTION WITH MUSCLE FLAP   OTHER SURGICAL HISTORY     POST RIGHT FEMOROPOPLITEAL BYPASS GRAFT   PERIPHERAL VASCULAR CATHETERIZATION  11/05/2014   Procedure: LOWER EXTREMITY ANGIOGRAPHY;  Surgeon: Lonni GORMAN Blade, MD;  Location: [facility] CATH LAB;  Service: Cardiovascular;;   VEIN HARVEST Left 04/24/2019   Procedure: LEFT LEG SAPHENOUS VEIN HARVEST;  Surgeon: Oris Krystal FALCON, MD;  Location: [facility] OR;  Service: Vascular;  Laterality: Left;    Social History   Socioeconomic History   Marital status: Married    Spouse name: Not on file   Number of children: 1   Years of education: Not on file    Highest education level: GED or equivalent  Occupational History   Occupation: retired    Associate Professor: [employer]  Tobacco Use   Smoking status: Former    Current packs/day: 0.00    Average packs/day: 1 pack/day for 40.0 years (40.0 ttl pk-yrs)    Types: Cigarettes    Start date: 10/15/1978    Quit date: 10/15/2018    Years since quitting: 5.9    Passive exposure: Never   Smokeless tobacco: Never   Tobacco comments:    quit 2010 and quit again 2019  Vaping Use   Vaping status: Never Used  Substance and Sexual Activity   Alcohol use: No    Alcohol/week: 0.0 standard drinks of alcohol   Drug use: No   Sexual activity: Not on file  Other Topics Concern   Not on file  Social History Narrative   Retired   Lives with family     Social Drivers of Corporate Investment Banker Strain: Low Risk  (  09/18/2024)   Overall Financial Resource Strain (CARDIA)    Difficulty of Paying Living Expenses: Not very hard  Recent Concern: Financial Resource Strain - Medium Risk (08/17/2024)   Overall Financial Resource Strain (CARDIA)    Difficulty of Paying Living Expenses: Somewhat hard  Food Insecurity: Food Insecurity Present (09/18/2024)   Hunger Vital Sign    Worried About Running Out of Food in the Last Year: Sometimes true    Ran Out of Food in the Last Year: Sometimes true  Transportation Needs: No Transportation Needs (09/18/2024)   PRAPARE - Administrator, Civil Service (Medical): No    Lack of Transportation (Non-Medical): No  Physical Activity: Inactive (09/18/2024)   Exercise Vital Sign    Days of Exercise per Week: 0 days    Minutes of Exercise per Session: 30 min  Stress: No Stress Concern Present (09/18/2024)   [employer] of Occupational Health - Occupational Stress Questionnaire    Feeling of Stress: Not at all  Social Connections: Moderately Isolated (09/18/2024)   Social Connection and Isolation Panel    Frequency of Communication with Friends and  Family: More than three times a week    Frequency of Social Gatherings with Friends and Family: Twice a week    Attends Religious Services: Patient declined    Database Administrator or Organizations: No    Attends Banker Meetings: Patient declined    Marital Status: Married  Catering Manager Violence: Not At Risk (09/18/2024)   Humiliation, Afraid, Rape, and Kick questionnaire    Fear of Current or Ex-Partner: No    Emotionally Abused: No    Physically Abused: No    Sexually Abused: No    Family History  Problem Relation Age of Onset   Diabetes Mother    Hyperlipidemia Sister    Hypertension Sister     Current Outpatient Medications  Medication Sig Dispense Refill   Accu-Chek FastClix Lancets MISC Inject 102 each into the skin 3 (three) times daily. 1 each 6   ACCU-CHEK GUIDE TEST test strip 1 each by Other route 3 (three) times daily.     acetaminophen  (TYLENOL ) 325 MG tablet Take 2 tablets (650 mg total) by mouth every 4 (four) hours as needed for mild pain (pain score 1-3).     albuterol  (PROVENTIL ) (2.5 MG/3ML) 0.083% nebulizer solution Take 3 mLs (2.5 mg total) by nebulization every 4 (four) hours as needed for wheezing or shortness of breath.     amLODipine  (NORVASC ) 5 MG tablet Take 5 mg by mouth at bedtime.     apixaban  (ELIQUIS ) 5 MG TABS tablet Take 1 tablet (5 mg total) by mouth 2 (two) times daily. 180 tablet 2   azithromycin  (ZITHROMAX ) 250 MG tablet Take 2 tabs by mouth on day 1, followed by one tab by mouth daily on days 2-5 6 tablet 0   carvedilol  (COREG ) 12.5 MG tablet Take 1 tablet (12.5 mg total) by mouth 2 (two) times daily with a meal.     DROPLET PEN NEEDLES 32G X 4 MM MISC USE AS DIRECTED WITH INSULIN  300 each 3   Fluticasone -Umeclidin-Vilant (TRELEGY ELLIPTA ) 100-62.5-25 MCG/ACT AEPB Inhale 100 mcg into the lungs daily. 60 each 3   gabapentin  (NEURONTIN ) 600 MG tablet Take 1 tablet (600 mg total) by mouth at bedtime. 90 tablet 0   Insulin   Degludec (TRESIBA ) 100 UNIT/ML SOLN Inject 5 Units into the skin daily.     levETIRAcetam  (KEPPRA ) 500 MG tablet Take 1  tablet (500 mg total) by mouth 2 (two) times daily. 180 tablet 0   mirtazapine  (REMERON ) 7.5 MG tablet Take 1 tablet (7.5 mg total) by mouth at bedtime. 90 tablet 3   MUCINEX  600 MG 12 hr tablet Take 600 mg by mouth 2 (two) times daily.     Multiple Vitamins-Minerals (CENTRUM MEN) TABS Take 1 tablet by mouth daily with breakfast.     NOVOLOG  FLEXPEN 100 UNIT/ML FlexPen Inject 0-10 Units into the skin See admin instructions. Inject 0-10 units into the skin three times a day before meals, per Sliding Scale: BGL 0-59 OR 400-1,000 = CALL MD; 60-150 = give nothing; 151-199 = 2 units; 200-249 = 4 units; 250-299 = 6 units; 300-349 = 8 units; 350-399 = 10 units     OXYGEN  Inhale 4 L/min into the lungs continuous.     pantoprazole  (PROTONIX ) 40 MG tablet Take 1 tablet (40 mg total) by mouth daily. 90 tablet 0   polyethylene glycol (MIRALAX  / GLYCOLAX ) 17 g packet Take 17 g by mouth 2 (two) times daily as needed for mild constipation or moderate constipation.     predniSONE  (DELTASONE ) 50 MG tablet Take 1 tablet (50 mg total) by mouth daily with breakfast. For 5 days 5 tablet 0   rosuvastatin  (CRESTOR ) 20 MG tablet Take 1 tablet (20 mg total) by mouth daily. 90 tablet 3   No current facility-administered medications for this visit.    No Known Allergies   REVIEW OF SYSTEMS:  Negative unless noted in HPI [X]  denotes positive finding, [ ]  denotes negative finding Cardiac  Comments:  Chest pain or chest pressure:    Shortness of breath upon exertion:    Short of breath when lying flat:    Irregular heart rhythm:        Vascular    Pain in calf, thigh, or hip brought on by ambulation:    Pain in feet at night that wakes you up from your sleep:     Blood clot in your veins:    Leg swelling:         Pulmonary    Oxygen  at home:    Productive cough:     Wheezing:          Neurologic    Sudden weakness in arms or legs:     Sudden numbness in arms or legs:     Sudden onset of difficulty speaking or slurred speech:    Temporary loss of vision in one eye:     Problems with dizziness:         Gastrointestinal    Blood in stool:     Vomited blood:         Genitourinary    Burning when urinating:     Blood in urine:        Psychiatric    Major depression:         Hematologic    Bleeding problems:    Problems with blood clotting too easily:        Skin    Rashes or ulcers:        Constitutional    Fever or chills:      PHYSICAL EXAMINATION:  Vitals:   10/03/24 1429  BP: (!) 152/77  Pulse: 85  Temp: 98 F (36.7 C)  TempSrc: Temporal    General:  WDWN in NAD; vital signs documented above Gait: Not observed, in rollator HENT: WNL, normocephalic Pulmonary: normal non-labored breathing Cardiac: regular HR Abdomen:  soft Vascular Exam/Pulses: 2+ femoral pulses, bilateral Dp/Pero doppler signals; doppler signal in RLE bypass graft at knee Extremities: without ischemic changes, without Gangrene , without cellulitis; without open wounds; right groin has healed up very well Musculoskeletal: no muscle wasting or atrophy  Neurologic: A&O X 3 Psychiatric:  The pt has Normal affect.  ASSESSMENT/PLAN:: 73 y.o. male here for follow up for ollow up wound check. He is s/p 1) repair of right common femoral pseudoaneurysm, 2) right common femoral to profunda bypass with dacryon, 3) revision of right common femoral to peroneal vein graft with new proximal anastomosis to dacryon interposition, 4) right lower extremity thrombectomy, 5) sartorius muscle flap, 6) right groin wound VAC placement on 07/17/24 by Dr. Gretta. Right groin is now fully healed. He is having a lot of hypersensitivity in the right thigh and foot. Suspect this is neuropathic. His BLE are well perfused and warm with doppler DP/ Peroneal signals. Doppler signal also in bypass graft of RLE.  -  can continue to just wash right groin with soap and water  pat dry. It is healed so do not need to continue wet to dry dressings - Continue Eliquis  and statin - he will follow up in 8 weeks with Right lower extremity bypass graft duplex and ABI   Teretha Damme, PA-C [facility] [phone]  Clinic MD:  [facility]

## 2024-10-04 ENCOUNTER — Other Ambulatory Visit: Payer: Self-pay

## 2024-10-04 DIAGNOSIS — Z9889 Other specified postprocedural states: Secondary | ICD-10-CM

## 2024-10-06 ENCOUNTER — Telehealth: Payer: Self-pay

## 2024-10-06 NOTE — Telephone Encounter (Signed)
 Patient's wife calls nurse line regarding follow up on medications.   She reports that patient was previously told to stop Hydralazine  and Isosorbide  at hospital discharge.   She was wondering when/if patient should restart.   Last BP was 172/71, however, reports that systolic has been as high as 170 since being off medications.   Advised that I would send message to PCP for further advisement.   Chiquita JAYSON English, RN

## 2024-10-09 NOTE — Telephone Encounter (Signed)
 Scheduled for office visit this Friday, 10/13/24 with Dr. Alba.   Chiquita JAYSON English, RN

## 2024-10-13 ENCOUNTER — Ambulatory Visit: Payer: Self-pay | Admitting: Family Medicine

## 2024-10-13 ENCOUNTER — Encounter: Payer: Self-pay | Admitting: Family Medicine

## 2024-10-13 VITALS — BP 154/72 | HR 74 | Ht 71.0 in

## 2024-10-13 DIAGNOSIS — E1142 Type 2 diabetes mellitus with diabetic polyneuropathy: Secondary | ICD-10-CM | POA: Diagnosis not present

## 2024-10-13 DIAGNOSIS — I152 Hypertension secondary to endocrine disorders: Secondary | ICD-10-CM | POA: Diagnosis not present

## 2024-10-13 DIAGNOSIS — E1159 Type 2 diabetes mellitus with other circulatory complications: Secondary | ICD-10-CM

## 2024-10-13 MED ORDER — AMLODIPINE BESYLATE 10 MG PO TABS: 10.0000 mg | ORAL_TABLET | Freq: Every day | ORAL | 1 refills | Status: AC

## 2024-10-13 MED ORDER — GABAPENTIN 600 MG PO TABS: ORAL_TABLET | ORAL | 0 refills | Status: AC

## 2024-10-13 NOTE — Assessment & Plan Note (Signed)
 Blood pressure log consistent with intermittently elevated systolic blood pressures.  Diastolic remains controlled.  Given patient complex cardiovascular history would err on the side of caution avoid risk of bottoming out diastolic blood pressure. - Continue Coreg  - Increase amlodipine  from 5 to 10 mg daily - Continue to hold Imdur  and hydralazine  - Recommended to follow-up with cardiologist earlier

## 2024-10-13 NOTE — Progress Notes (Signed)
" ° ° °  SUBJECTIVE:   CHIEF COMPLAINT / HPI: BP follow-up  Discussed the use of AI scribe software for clinical note transcription with the patient, who gave verbal consent to proceed.  History of Present Illness -Per chart review patient was discharged on amlodipine  and Coreg  after hospitalization in September. - Imdur  and hydralazine  were both held at that time. - Patient's wife called office on the 12th with concerns for elevated blood pressures noted to be 172/71.   Kyle [name] is a 73 year old male with hypertension who presents for medication management. He is accompanied by a caregiver.  Hypertension and antihypertensive medication management - Home blood pressures range from 150-170 mmHg systolic and 60s-70s mmHg diastolic. - Currently takes amlodipine  and carvedilol  (Coreg ). - Antihypertensive medication was discontinued after an episode of pneumonia; needs to discuss restarting it.  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 - Recurrent toe pain not controlled on current gabapentin  regimen (600 mg at night). - Previously experienced partial relief with gabapentin  300 mg during the day and 600 mg at night. - No foot swelling.  Respiratory symptoms - Recent wheezing identified by well care nurse, which prompted a prior appointment.  Alpha lipoic acid Bentfotiamine    PERTINENT  PMH / PSH: HFrEF, A-fib, hypertension, PAD, pulmonary hypertension  OBJECTIVE:   BP (!) 154/72   Pulse 74   Ht 5' 11 (1.803 m)   SpO2 94% Comment: WITH OXYGEN   BMI 21.34 kg/m   Physical Exam General: NAD, well appearing, wheelchair bound Neuro: A&O Respiratory: normal WOB on RA Extremities: Moving all 4 extremities equally   ASSESSMENT/PLAN:   Assessment & Plan Hypertension associated with diabetes (HCC) Blood pressure log consistent with intermittently elevated systolic blood pressures.  Diastolic remains controlled.  Given patient  complex cardiovascular history would err on the side of caution avoid risk of bottoming out diastolic blood pressure. 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  Continue 600 mg nightly.  Recommended to not add OTC nerve supplement to patient's medications for risk of medication interaction and polypharmacy.    Return in about 4 weeks (around 11/10/2024).  Kyle Provencal, MD, PGY-3 [facility] 5:17 PM 10/13/2024  [facility]   "

## 2024-10-13 NOTE — Patient Instructions (Addendum)
 It was great to see you! Thank you for allowing me to participate in your care!  Our plans for today:   VISIT SUMMARY: You visited today to manage your hypertension and discuss your right-sided neuropathy and toe pain. We also reviewed your recent respiratory symptoms.  YOUR PLAN: POST-STROKE NEUROPATHY WITH PAIN, RIGHT SIDE: You have chronic neuropathy on your right side with pain that is not well controlled with your current medication. - Continue your gabapentin  to 600 mg at night and add 300 mg in the morning. -Cut your 600 mg tablets in half to achieve the 300 mg dose. -We reviewed the ingredients of your supplements to ensure they do not interact with your current medications. - I do not recommend you take the neuropathy supplement as it may interact with your other medicines  HYPERTENSION: Your recent blood pressure readings have been high, ranging from 150-170 mmHg systolic. - I have increased your amlodipine  to 10 mg daily - Do not restart your Imdur  or hydralazine  - Continue taking your Coreg  - I recommend calling your cardiologist to schedule an earlier follow-up appointment    Please arrive 15 minutes PRIOR to your next scheduled appointment time! If you do not, this affects OTHER patients' care.  Take care and seek immediate care sooner if you develop any concerns.   [name], MD, PGY-3 [facility] 3:07 PM 10/13/2024  [facility]

## 2024-10-30 ENCOUNTER — Other Ambulatory Visit (HOSPITAL_COMMUNITY): Payer: Self-pay

## 2024-10-30 ENCOUNTER — Telehealth: Payer: Self-pay | Admitting: Family Medicine

## 2024-10-30 NOTE — Telephone Encounter (Signed)
 Spoke with Grayce  Giampino to see where forms where, Grayce stated that she  placed form in PCP box by . Nelson Land, CMA

## 2024-10-30 NOTE — Telephone Encounter (Signed)
 Per [employer] rep, shipments were discontinued after 09/25/24.

## 2024-11-06 ENCOUNTER — Other Ambulatory Visit: Payer: Self-pay

## 2024-11-06 MED ORDER — ACCU-CHEK GUIDE TEST VI STRP: 1.0000 | ORAL_STRIP | Freq: Three times a day (TID) | 5 refills | Status: AC

## 2024-11-07 NOTE — Telephone Encounter (Signed)
 Patient's wife call asking if there was any update about the denial letter that was dropped off on 15/25. Wife asks if the doctor could please call her.

## 2024-11-08 ENCOUNTER — Other Ambulatory Visit: Payer: Self-pay | Admitting: Family Medicine

## 2024-11-08 DIAGNOSIS — K219 Gastro-esophageal reflux disease without esophagitis: Secondary | ICD-10-CM

## 2024-11-08 DIAGNOSIS — R1310 Dysphagia, unspecified: Secondary | ICD-10-CM

## 2024-11-09 ENCOUNTER — Telehealth: Payer: Self-pay | Admitting: Family Medicine

## 2024-11-09 ENCOUNTER — Encounter: Payer: Self-pay | Admitting: Family Medicine

## 2024-11-09 ENCOUNTER — Other Ambulatory Visit: Payer: Self-pay | Admitting: Family Medicine

## 2024-11-09 DIAGNOSIS — I639 Cerebral infarction, unspecified: Secondary | ICD-10-CM | POA: Insufficient documentation

## 2024-11-09 DIAGNOSIS — R54 Age-related physical debility: Secondary | ICD-10-CM | POA: Insufficient documentation

## 2024-11-09 NOTE — Telephone Encounter (Signed)
 Spoke to patient's wife about insurance denial of physical therapy services.  Wife states she has tried to reach out to the [employer] company multiple times and was told that a doctor would need to submit documentation of the medical need for the services.  Will look into this further and contact patient and wife with updates.  Also discussed long-term use of pantoprazole  which can have negative side effects especially in the geriatric population.  Will send referral to GI to evaluate patient for treatment of GERD.

## 2024-11-09 NOTE — Telephone Encounter (Signed)
 Called [employer] appeals department regarding denial of medical coverage for Home Health services (to be provided 10/18/2024-12/16/2024, reference #[id number]). Case #[id number]. Filed fast appeal due to risk of harm to patient if services are not provided. Listed frailty, type 2 diabetes, stroke, and pseudoaneurysm as relevant diagnoses. Discussed that primary caregiver is patient's wife, who is 79 and unable to provide sufficient care for patient given his multiple chronic health conditions (including being wheelchair bound). Will submit documentation from recent [facility] visits to support appeal and fax to [facility] at [phone].

## 2024-11-15 ENCOUNTER — Ambulatory Visit: Admitting: Adult Health

## 2024-11-28 ENCOUNTER — Ambulatory Visit

## 2024-11-28 ENCOUNTER — Ambulatory Visit (HOSPITAL_COMMUNITY)

## 2024-11-28 ENCOUNTER — Other Ambulatory Visit: Payer: Self-pay

## 2024-11-28 MED ORDER — PANTOPRAZOLE SODIUM 40 MG PO TBEC: 40.0000 mg | DELAYED_RELEASE_TABLET | Freq: Every day | ORAL | 0 refills | Status: AC

## 2024-12-15 ENCOUNTER — Ambulatory Visit (HOSPITAL_COMMUNITY)

## 2024-12-19 ENCOUNTER — Ambulatory Visit
